# Patient Record
Sex: Male | Born: 1935
Health system: Southern US, Community
[De-identification: ages and names within clinical notes are randomized; demographics above are authoritative.]

## PROBLEM LIST (undated history)

## (undated) DIAGNOSIS — N210 Calculus in bladder: Secondary | ICD-10-CM

## (undated) DIAGNOSIS — I77810 Thoracic aortic ectasia: Secondary | ICD-10-CM

## (undated) DIAGNOSIS — Z8679 Personal history of other diseases of the circulatory system: Secondary | ICD-10-CM

## (undated) DIAGNOSIS — R7303 Prediabetes: Secondary | ICD-10-CM

## (undated) DIAGNOSIS — Z952 Presence of prosthetic heart valve: Secondary | ICD-10-CM

## (undated) DIAGNOSIS — I639 Cerebral infarction, unspecified: Secondary | ICD-10-CM

## (undated) DIAGNOSIS — T8859XA Other complications of anesthesia, initial encounter: Secondary | ICD-10-CM

## (undated) DIAGNOSIS — I2510 Atherosclerotic heart disease of native coronary artery without angina pectoris: Secondary | ICD-10-CM

## (undated) DIAGNOSIS — Z86010 Personal history of colonic polyps: Secondary | ICD-10-CM

## (undated) DIAGNOSIS — I499 Cardiac arrhythmia, unspecified: Secondary | ICD-10-CM

## (undated) DIAGNOSIS — I10 Essential (primary) hypertension: Secondary | ICD-10-CM

## (undated) DIAGNOSIS — M1990 Unspecified osteoarthritis, unspecified site: Secondary | ICD-10-CM

## (undated) DIAGNOSIS — D696 Thrombocytopenia, unspecified: Secondary | ICD-10-CM

## (undated) DIAGNOSIS — Z8774 Personal history of (corrected) congenital malformations of heart and circulatory system: Secondary | ICD-10-CM

## (undated) DIAGNOSIS — R739 Hyperglycemia, unspecified: Secondary | ICD-10-CM

## (undated) DIAGNOSIS — R011 Cardiac murmur, unspecified: Secondary | ICD-10-CM

## (undated) DIAGNOSIS — Z860101 Personal history of adenomatous and serrated colon polyps: Secondary | ICD-10-CM

## (undated) DIAGNOSIS — I071 Rheumatic tricuspid insufficiency: Secondary | ICD-10-CM

## (undated) DIAGNOSIS — Z87898 Personal history of other specified conditions: Secondary | ICD-10-CM

## (undated) DIAGNOSIS — N400 Enlarged prostate without lower urinary tract symptoms: Secondary | ICD-10-CM

## (undated) DIAGNOSIS — I77819 Aortic ectasia, unspecified site: Secondary | ICD-10-CM

## (undated) DIAGNOSIS — Z8719 Personal history of other diseases of the digestive system: Secondary | ICD-10-CM

## (undated) DIAGNOSIS — G629 Polyneuropathy, unspecified: Secondary | ICD-10-CM

## (undated) DIAGNOSIS — I4819 Other persistent atrial fibrillation: Secondary | ICD-10-CM

## (undated) DIAGNOSIS — K5730 Diverticulosis of large intestine without perforation or abscess without bleeding: Secondary | ICD-10-CM

## (undated) DIAGNOSIS — Z9889 Other specified postprocedural states: Secondary | ICD-10-CM

## (undated) DIAGNOSIS — R600 Localized edema: Secondary | ICD-10-CM

## (undated) DIAGNOSIS — D649 Anemia, unspecified: Secondary | ICD-10-CM

## (undated) HISTORY — DX: Thoracic aortic ectasia: I77.810

## (undated) HISTORY — DX: Rheumatic tricuspid insufficiency: I07.1

## (undated) HISTORY — DX: Essential (primary) hypertension: I10

## (undated) HISTORY — DX: Aortic ectasia, unspecified site: I77.819

## (undated) HISTORY — PX: OTHER SURGICAL HISTORY: SHX169

## (undated) HISTORY — DX: Cerebral infarction, unspecified: I63.9

## (undated) HISTORY — PX: TRANSTHORACIC ECHOCARDIOGRAM: SHX275

## (undated) HISTORY — PX: CARDIAC CATHETERIZATION: SHX172

## (undated) HISTORY — DX: Personal history of other diseases of the digestive system: Z87.19

---

## 2001-05-21 DIAGNOSIS — N401 Enlarged prostate with lower urinary tract symptoms: Secondary | ICD-10-CM | POA: Insufficient documentation

## 2001-05-21 DIAGNOSIS — N138 Other obstructive and reflux uropathy: Secondary | ICD-10-CM | POA: Insufficient documentation

## 2003-01-09 ENCOUNTER — Emergency Department (HOSPITAL_COMMUNITY): Admission: EM | Admit: 2003-01-09 | Discharge: 2003-01-09 | Payer: Self-pay | Admitting: Emergency Medicine

## 2004-01-09 HISTORY — PX: OTHER SURGICAL HISTORY: SHX169

## 2005-02-28 HISTORY — PX: OTHER SURGICAL HISTORY: SHX169

## 2005-03-01 ENCOUNTER — Inpatient Hospital Stay (HOSPITAL_COMMUNITY): Admission: EM | Admit: 2005-03-01 | Discharge: 2005-03-20 | Payer: Self-pay | Admitting: Emergency Medicine

## 2005-03-01 ENCOUNTER — Encounter (INDEPENDENT_AMBULATORY_CARE_PROVIDER_SITE_OTHER): Payer: Self-pay | Admitting: Specialist

## 2009-09-18 ENCOUNTER — Inpatient Hospital Stay (HOSPITAL_COMMUNITY): Admission: EM | Admit: 2009-09-18 | Discharge: 2009-09-22 | Payer: Self-pay | Admitting: Emergency Medicine

## 2010-02-08 HISTORY — PX: CATARACT EXTRACTION W/ INTRAOCULAR LENS  IMPLANT, BILATERAL: SHX1307

## 2010-02-22 ENCOUNTER — Ambulatory Visit (HOSPITAL_BASED_OUTPATIENT_CLINIC_OR_DEPARTMENT_OTHER)
Admission: RE | Admit: 2010-02-22 | Discharge: 2010-02-22 | Disposition: A | Discharge status: Home / Self Care | Payer: MEDICARE | Source: Ambulatory Visit | Attending: Specialist | Admitting: Specialist

## 2010-02-22 DIAGNOSIS — H269 Unspecified cataract: Secondary | ICD-10-CM | POA: Insufficient documentation

## 2010-02-22 DIAGNOSIS — Z01810 Encounter for preprocedural cardiovascular examination: Secondary | ICD-10-CM | POA: Insufficient documentation

## 2010-02-22 DIAGNOSIS — Z01812 Encounter for preprocedural laboratory examination: Secondary | ICD-10-CM | POA: Insufficient documentation

## 2010-02-22 LAB — POCT I-STAT 4, (NA,K, GLUC, HGB,HCT)
Glucose, Bld: 117 mg/dL — ABNORMAL HIGH (ref 70–99)
HCT: 40 % (ref 39.0–52.0)
Hemoglobin: 13.6 g/dL (ref 13.0–17.0)
Potassium: 3.9 mEq/L (ref 3.5–5.1)
Sodium: 143 mEq/L (ref 135–145)

## 2010-03-23 LAB — BASIC METABOLIC PANEL
BUN: 10 mg/dL (ref 6–23)
BUN: 14 mg/dL (ref 6–23)
BUN: 16 mg/dL (ref 6–23)
CO2: 23 mEq/L (ref 19–32)
CO2: 24 mEq/L (ref 19–32)
CO2: 26 mEq/L (ref 19–32)
Calcium: 8.2 mg/dL — ABNORMAL LOW (ref 8.4–10.5)
Calcium: 8.3 mg/dL — ABNORMAL LOW (ref 8.4–10.5)
Calcium: 8.3 mg/dL — ABNORMAL LOW (ref 8.4–10.5)
Chloride: 106 mEq/L (ref 96–112)
Chloride: 109 mEq/L (ref 96–112)
Chloride: 112 mEq/L (ref 96–112)
Creatinine, Ser: 0.77 mg/dL (ref 0.4–1.5)
Creatinine, Ser: 0.83 mg/dL (ref 0.4–1.5)
Creatinine, Ser: 0.91 mg/dL (ref 0.4–1.5)
GFR calc Af Amer: >60 mL/min (ref >60)
GFR calc Af Amer: >60 mL/min (ref >60)
GFR calc Af Amer: >60 mL/min (ref >60)
GFR calc non Af Amer: >60 mL/min (ref >60)
GFR calc non Af Amer: >60 mL/min (ref >60)
GFR calc non Af Amer: >60 mL/min (ref >60)
Glucose, Bld: 109 mg/dL — ABNORMAL HIGH (ref 70–99)
Glucose, Bld: 86 mg/dL (ref 70–99)
Glucose, Bld: 92 mg/dL (ref 70–99)
Potassium: 3.5 mEq/L (ref 3.5–5.1)
Potassium: 3.6 mEq/L (ref 3.5–5.1)
Potassium: 3.7 mEq/L (ref 3.5–5.1)
Sodium: 139 mEq/L (ref 135–145)
Sodium: 141 mEq/L (ref 135–145)
Sodium: 142 mEq/L (ref 135–145)

## 2010-03-23 LAB — COMPREHENSIVE METABOLIC PANEL
ALT: 19 U/L (ref 0–53)
AST: 22 U/L (ref 0–37)
Albumin: 3.8 g/dL (ref 3.5–5.2)
Alkaline Phosphatase: 54 U/L (ref 39–117)
BUN: 20 mg/dL (ref 6–23)
CO2: 25 mEq/L (ref 19–32)
Calcium: 9 mg/dL (ref 8.4–10.5)
Chloride: 103 mEq/L (ref 96–112)
Creatinine, Ser: 1.01 mg/dL (ref 0.4–1.5)
GFR calc Af Amer: >60 mL/min (ref >60)
GFR calc non Af Amer: >60 mL/min (ref >60)
Glucose, Bld: 156 mg/dL — ABNORMAL HIGH (ref 70–99)
Potassium: 3.4 mEq/L — ABNORMAL LOW (ref 3.5–5.1)
Sodium: 138 mEq/L (ref 135–145)
Total Bilirubin: 1 mg/dL (ref 0.3–1.2)
Total Protein: 7.2 g/dL (ref 6.0–8.3)

## 2010-03-23 LAB — CBC
HCT: 33.9 % — ABNORMAL LOW (ref 39.0–52.0)
HCT: 38.8 % — ABNORMAL LOW (ref 39.0–52.0)
Hemoglobin: 12 g/dL — ABNORMAL LOW (ref 13.0–17.0)
Hemoglobin: 13.4 g/dL (ref 13.0–17.0)
MCH: 32.7 pg (ref 26.0–34.0)
MCH: 33.4 pg (ref 26.0–34.0)
MCHC: 34.6 g/dL (ref 30.0–36.0)
MCHC: 35.4 g/dL (ref 30.0–36.0)
MCV: 94.1 fL (ref 78.0–100.0)
MCV: 94.7 fL (ref 78.0–100.0)
Platelets: 146 10*3/uL — ABNORMAL LOW (ref 150–400)
Platelets: 153 10*3/uL (ref 150–400)
RBC: 3.6 MIL/uL — ABNORMAL LOW (ref 4.22–5.81)
RBC: 4.1 MIL/uL — ABNORMAL LOW (ref 4.22–5.81)
RDW: 12.8 % (ref 11.5–15.5)
RDW: 13.1 % (ref 11.5–15.5)
WBC: 7.5 10*3/uL (ref 4.0–10.5)
WBC: 7.6 10*3/uL (ref 4.0–10.5)

## 2010-03-23 LAB — URINALYSIS, ROUTINE W REFLEX MICROSCOPIC
Glucose, UA: NEGATIVE mg/dL
Hgb urine dipstick: NEGATIVE
Ketones, ur: 15 mg/dL — AB
Leukocytes, UA: NEGATIVE
Nitrite: NEGATIVE
Protein, ur: 30 mg/dL — AB
Specific Gravity, Urine: 1.032 — ABNORMAL HIGH (ref 1.005–1.030)
Urobilinogen, UA: 0.2 mg/dL (ref 0.0–1.0)
pH: 5.5 (ref 5.0–8.0)

## 2010-03-23 LAB — URINE MICROSCOPIC-ADD ON

## 2010-03-23 LAB — DIFFERENTIAL
Basophils Absolute: 0 10*3/uL (ref 0.0–0.1)
Basophils Relative: 0 % (ref 0–1)
Eosinophils Absolute: 0 10*3/uL (ref 0.0–0.7)
Eosinophils Relative: 0 % (ref 0–5)
Lymphocytes Relative: 12 % (ref 12–46)
Lymphs Abs: 0.9 10*3/uL (ref 0.7–4.0)
Monocytes Absolute: 1.3 10*3/uL — ABNORMAL HIGH (ref 0.1–1.0)
Monocytes Relative: 17 % — ABNORMAL HIGH (ref 3–12)
Neutro Abs: 5.3 10*3/uL (ref 1.7–7.7)
Neutrophils Relative %: 71 % (ref 43–77)

## 2010-03-23 LAB — MAGNESIUM
Magnesium: 1.7 mg/dL (ref 1.5–2.5)
Magnesium: 1.9 mg/dL (ref 1.5–2.5)

## 2010-03-23 LAB — MRSA PCR SCREENING: MRSA by PCR: NEGATIVE

## 2010-03-23 LAB — LIPASE, BLOOD: Lipase: 20 U/L (ref 11–59)

## 2010-05-26 NOTE — H&P (Signed)
NAME:  [name], PRIEN NO.:  [id number]   MEDICAL RECORD NO.:  [id number]          PATIENT TYPE:  INP   LOCATION:  0098                         FACILITY:  [facility]   PHYSICIAN:  Anselm Pancoast. Weatherly, M.D.DATE OF BIRTH:  1935-10-06   DATE OF ADMISSION:  03/01/2005  DATE OF DISCHARGE:                                HISTORY & PHYSICAL   CHIEF COMPLAINT:  Nausea, vomiting, abdominal pain.   HISTORY:  [name] is a 75 year old male who was referred to [place] by  the [facility], I think out at [facility], with the following history. The  patient stated that on Monday he started having significant nausea and  vomiting, upper abdominal pain, thought he had this norovirus. He did not  actually had diarrhea; he had one loose bowel movement. And then yesterday  he was seen in the clinic, treated with intravenous fluids, and then  released, did not improve, this morning returned and an abdominal x-ray was  obtained flat and upright that showed a massively dilated small bowel with  thickened, edematous small bowel. The physician called and I suggested the  patient come immediately to the emergency room, his wife drove him in, and  in the ER initially an NG tube was placed and there was over a liter of  small-bowel contents aspirated with the NG tube initially. Intravenous  fluids were started and he was given a bolus. Next, the patient's laboratory  studies were checked. White count was only 5000 with a hematocrit of 42. BUN  showed 44 and a creatinine of 1.3, liver tests were normal, and I got a  chest x-ray which did not show any free air. I discussed with the patient  and his wife that he has got guaiac positive stools on a hemoccult in Dr.  Pablo Lawrence office and I was certainly concerned that this was a cancer causing  with this massively dilated small bowel and no history of previous abdominal  surgery, and I could not identify any hernias over the umbilicus or groin.  The  patient was given permission for surgery and I do not think any  additional x-rays are needed with the markedly dilated small bowel and just  massive NG drainage that we are having.   PAST HISTORY:  He has got a history of hypertension which he said over the  last couple of days, of course, he has been vomiting all the tablets that he  normally takes for his blood pressure, and his medications are:  1.  Atenolol 50 mg every day.  2.  Hydrochlorothiazide, I think 12.5 mg a day.  3.  Atenolol 100 mg daily.  4.  He is on medication for his prostate.   He says he has had an elevated PSA but they have never confirmed a diagnosis  of cancer of the prostate, and he has had colonoscopies and he has also had  diverticulosis, but he has not actually had any acute diverticulitis and his  pain has always been nausea and vomiting and upper abdomen and not in the  lower abdomen. He denies angina, even though he  has high blood pressure, and  he is retried. He said he used to work for [employer]. Maybe he has lost 5 or 6  pounds since Christmas, but he has been trying to lose a little weight.   PHYSICAL EXAMINATION:  GENERAL:  He is a pleasant but uncomfortable male. He  has got an NG tube in with bilious drainage and certainly appears  uncomfortable because of his abdomen. General appearance as stated above.  LUNGS:  Good breath sounds bilaterally.  CARDIAC:  He has got a mild sinus tachycardia.  ABDOMEN:  He is not acutely tender in any quadrant of the abdomen but he is  very distended. There are no umbilical or inguinal hernias noted and I  cannot appreciate any really localized tenderness of any definite quadrant  of the abdomen.  RECTAL:  I did not repeat the stool hemoccult since it was relayed to me.  EXTREMITIES:  He has got no pitting edema and his skin appears unremarkable.   The patient has received about a liter-and-a-half of IV fluids and we will  give him 3 g of Unasyn and permission  obtained to proceed with exploratory  laparotomy.           ______________________________  Anselm Pancoast. Zachery Dakins, M.D.     WJW/MEDQ  D:  03/01/2005  T:  03/02/2005  Job:  [id number]

## 2010-05-26 NOTE — Discharge Summary (Signed)
NAME:  Gerald, Ayers NO.:  [id number]   MEDICAL RECORD NO.:  [id number]          PATIENT TYPE:  INP   LOCATION:  1616                         FACILITY:  [facility]   PHYSICIAN:  Anselm Pancoast. Weatherly, M.D.DATE OF BIRTH:  01/18/1935   DATE OF ADMISSION:  03/01/2005  DATE OF DISCHARGE:  03/20/2005                                 DISCHARGE SUMMARY   DIAGNOSES:  1.  Subacute cholecystitis and mechanical small-bowel obstruction on a very      marked ileus that was prolonged.  2.  History of benign prostatic hypertrophy, on prostate medicines      chronically.  3.  Mild hypertension.   OPERATION:  1.  Exploratory laparotomy.  2.  Cholecystectomy.   HISTORY:  [name] is a 75 year old Caucasian male with history of  high blood pressure and BPH, who for approximately 5 days has had nausea,  vomiting and abdominal distension, though originally to have a virus.  He is  followed by Dr. Dewaine Oats at the [facility] out on [address] and actually had  received intravenous fluids on 1 day and then released, returned and had an  x-ray that showed definitely mechanical small-bowel obstruction with  multiple very dilated loops of small bowel and multiple air-fluid levels and  I was called, being the physician on call, on the 22nd.  The patient was  sent to the emergency room and on examination had an elevated pulse of 109.  His blood pressure was elevated 189/108, but he was in pain, and he was  afebrile.  Laboratory studies in the emergency room showed a hematocrit of  43 with white count 5000, but his BUN was 44 with glucose of 177 -- he is  not a known diabetic -- and a potassium of 3.4.   HOSPITAL COURSE:  We started him on intravenous fluids, obtained a chest x-  ray that showed no active disease and I recommended we proceed on to surgery  for a small bowel obstruction and actually he was fearing that we were going  to find a tumor, since he really he had not had any  previous abdominal  surgery.  The patient was taken to surgery later that afternoon, Dr. Jerelene Redden assisted and through a midline incision, we found a very dilated small  bowel for approximately the two-thirds or three-fourths of the small bowel,  but then from an area of the ileum that was laying up along the gallbladder  distally, there was no dilatation and there was no dilatation that we could  palpate or feel in the colon, right or left.  He did have a subacutely  inflamed gallbladder and we thought that maybe this was an ileus related to  the cholecystitis.  We removed his gallbladder, but it had no actual stones.  We did not do a cholangiogram.  We milked out the succus entericus back  through an NG tube into the patient's stomach and then postoperatively kept  an NG tube in and treated him as a mechanical bowel obstruction, even though  at the time of findings, we could not actually  find anything with the  exception there was a little indentation in the omentum that was laying  against the gallbladder where the bowel was kind of adhered and we thought  that possibly this was somehow or another related to be cholecystitis.  For  the next 2-3 days he had a moderate amount of NG drainage, but his abdomen  was becoming less distended.  Dr. Maryagnes Amos actually saw him over the weekend  and I think I had operated on Thursday and over the next few days, we did  not remove his NG tube, but he started having bowel function and I removed  the NG tube on the 26th.  Actually, the tube had slipped out and he had had  a bowel movement and we kept him n.p.o. for about 24 hours.  The following  day we started liquids, but he started bloating again and I was not sure,  since he was having bowel functions.  He became repeat distended and the NG  tube was reinserted on the 28th, which was about 3 days after the NG tube  was originally removed, and he had a moderate amount of NG drainage.  Since  by  this time he had now been about a week without actually eating and had of  course been sick 4 days prior to coming to the hospital, I started  hyperalimentation and scheduled a Gastrografin enema.  The Gastrografin  enema shows significant diverticulosis, but no evidence of obstruction or  diverticulitis, and then the distal small bowel reflux where the  Gastrografin went way on up into the dilated jejunum with no focal point of  obstruction.  The patient was having just a low-grade temperature, did not  like to cough with the NG tube.  We got a  PICC line and got him started on  TPN.  We were kind of in a dilemma now on what was going on and asked Dr.  Nadine Counts Buccini to see him from a GI standpoint.  I think actually Dr. Dorena Cookey originally saw him and then Dr. Matthias Hughs and they thought that this was  even an obstruction or pseudo-obstruction and agreed with the NG, TPN and as  we were already doing.  I also checked thyroid functions and cortisol  levels; these were all normal.  The patient had a moderate amount of NG  drainage.  We had him on Reglan and Protonix and the abdomen was becoming  less distended, but it was probably more related to the NG drainage, but he  was also having some bowel function.  Approximately 4 or 5 days later, at  which time he was clinically improving, but not well, we obtained a CT and  this showed just like what we had found at surgery, no evidence of any areas  of infection or abscess or lesions that had been missed, but a dilated small  bowel at the proximal two-thirds of the small bowel and then kind of  decompressed distal small bowel with the hyperalimentation and ambulation  and activity, and of course we had taken his Foley catheter out, but he was  having difficulty voiding and the patient actually does some self-  catheterization and I had put the Foley catheter back in to straight  drainage instead of trying to do in-and-out catheterizations.  He  then started having bowel function and we slowly removed the NG tube after  clamping it for 24 hours and then kind of advanced his diet slowly.  He had  an episode of pain in the left ankle that appeared to be gout, which he has  had before and we switched his Reglan to p.o. and had placed him back on  some gout medications.  He is slowly improving.  White count was never  elevated, had just a little low-grade fever about the time we reput the NG  tube in and was ready to be discharge, where the patient wanted to  discharge, even though it was kind of plus/minus whether he was definitely  ready to be discharge.  His electrolytes were normal and he was discharged  on the 13th with instructions that if he is having any vomiting or fever, to  let me know.  He is taking Ensure in addition to kind of a full liquid diet  and I will see him in the office in approximately 3-4 days.  He is anemic,  but I think it is probably more related to his kind of chronic illnesses and  much blood drawing as we have never noted any thing on the colon  examinations by x-ray of any lesions and he has not had an upper endoscopy  or colonoscopy.  He will be followed in the office and if any positive  stools are determined, may need a colon or upper endoscopy.  He is  discharged on Reglan and Protonix and ferrous sulfate and has resumed his  prostate medications.  This is a little prolonged ileus and whether this was  a primary problem secondary to the gallstones or whether the cholecystitis  occurred because of his inability to eat for 3 or 4 days, I am not sure,  since there were no stones.  Further testing may be needed and he will be  followed by [place] closely over the next few weeks.           ______________________________  Anselm Pancoast. Zachery Dakins, M.D.     WJW/MEDQ  D:  04/29/2005  T:  05/01/2005  Job:  [id number]   cc:   Molly Maduro L. Foy [place], M.D.  Fax: [phone]   Bernette Redbird, M.D.  Fax: [phone]

## 2010-05-26 NOTE — Op Note (Signed)
NAME:  [name], [name] NO.:  [id number]   MEDICAL RECORD NO.:  [id number]          PATIENT TYPE:  EMS   LOCATION:  ED                           FACILITY:  [facility]   PHYSICIAN:  Anselm Pancoast. Weatherly, M.D.DATE OF BIRTH:  November 25, 1935   DATE OF PROCEDURE:  02/28/2005  DATE OF DISCHARGE:                                 OPERATIVE REPORT   PREOPERATIVE DIAGNOSIS:  Small bowel obstruction.   POSTOPERATIVE DIAGNOSIS:  Massively dilated small bowel and acute  cholecystitis.   OPERATIONS:  Exploratory laparotomy and a cholecystectomy. Also has a  history of diverticulosis sigmoid colon.   SURGEON:  Dr. Anselm Pancoast. Weatherly.   ASSISTANT:  Dr. Jerelene Redden.   ANESTHESIA:  General anesthesia.   HISTORY:  [name] is a 75 year old male who presented to the ER  today after being seen by Dr. Pablo Lawrence office with about a 3-4 day history of  nausea and vomiting and a large quantity of bilious vomitus. He had been  seen in the clinic yesterday and was thought to have this Norovirus, treated  with IV fluids and released. He did not feel better last evening and  returned today and an abdominal x-ray flat and upright was obtained which  showed a massively dilated small bowel with multiple air-fluid levels. He  was referred here and on examination he is very distended with a large  amount of NG drainage. We placed the NG and got over a liter initially and  gives no history of previous chronic cramping, bloating, etc. He has had a  history of diverticulosis in the past and had some difficulty voiding but  has not got a history of prostate cancer or any definite known malignancy. I  cannot appreciate any hernias and I recommended we proceed on with  exploratory laparotomy because he is so distended and uncomfortable.  Preoperatively, he was given 3 grams of  Unasyn and he has got PAS  stockings. Induction of general anesthesia,  endotracheal tube was  positioned. He has already  got the NG tube in and then the abdomen was  shaved in the midline. A Foley catheter was inserted sterilely. A small  incision was made up above the umbilicus and very carefully entered into the  peritoneal cavity about a 3 inch incision above the umbilicus and the  underlying small bowel is very hyperemic and very dilated. I extended the  incision a little bit upwards so I could get my hand in and then on  palpation could feel the diverticulosis in the lower abdomen but could not  feel an actual tumor in the cecal area. I extended the incision down below  the umbilicus so that we could then start kind of milking out or getting the  very dilated small bowel out. In removing the very dilated small bowel, we  could see that probably about a distal foot of terminal ileum does not  appear to be dilated. We extended the incision upward because he is so  distended that really you could not get this dilated small bowel out through  this small incision and then after  we kind of milked the small bowel out and  actually the NG tube is in the stomach but there is still a large amount of  drainage in the stomach and we got the anesthetist to kind of play with the  NG tube so that we could get it decompressed with irrigating, aspirating,  etc. Then we could milk the proximal small bowel contents back through the  duodenum into the stomach so we could get more room and then after this was  done and the small bowel was definitely becoming less angry looking because  it was being decompressed, we were kind of in a dilemma on why did he have  such a small bowel dilatation if we could not find an actual point of  obstruction. We then on looking around in the upper abdomen noted that he  has got a very distended tight gallbladder and it looks like it is an  acutely inflamed gallbladder. There was also an area of the omentum that was  up close to the gallbladder that has a little recess in it and I think that   really there was a loop of small bowel that was kind of caught up in this  and as we were kind of eviscerating the small bowel we kind of  separated  that and we think that was the actual point of obstruction. We next ran the  small bowel again, could not feel any type of food or anything to cause a  blockage within it. The small bowel gas distally now was going on into the  colon and in palpation of the cecum the appendix is normal. We could not  feel any mass in the ascending colon or hepatic flexure. The sigmoid colon  has extensive diverticulosis and adhesions around it but this does not  appear to the point of obstruction down in the pelvis and we elected to go  ahead and do a cholecystectomy. This was done open identifying the cystic  artery and the cystic duct. Tying the cystic duct doubly with two sutures of  2-0 silk having a Kelly on its junction with the gallbladder and then  dividing at the cystic artery it was doubly clipped proximally, singly  distally and divided. Then I switched to the upper gallbladder and actually  decompressed it with a trocar so we could grasp it with a Tresa Endo and then  very carefully dissected this markedly inflamed gallbladder from the  gallbladder fossa. Hemostasis was obtained predominantly with cautery and  the proximal area was carefully dissected making sure that we did not injure  the common biliary system. A pack was placed in the area and then later we  removed this and lightly cauterized a few little areas and then basically  reinspected the small bowel again and could not find any other problems. The  sponge count was correct. We placed the small bowel kind of in anatomic  position, made sure the NG tube was in good position, irrigated and  aspirated the remaining little fluid out of the stomach and then brought the  omentum over the small bowel. I then closed this with a running #1 Prolene. Two sutures were placed and then the skin was closed  with staples. I am  going to keep him on intravenous fluids and antibiotics, keep his Foley and  hopefully he will be decompressed in a day or two and start having bowel  function. I think we are going to need to get an  x-ray of his colon after he  has kind of recovered but whether it was extensive ileus or definite  obstruction secondary to adhesions from the gallbladder I cannot be sure. We  certainly could not identify anything else that would cause the obstruction.  Sponge and needle counts were correct and estimated blood loss is minimal.           ______________________________  Anselm Pancoast. Zachery Dakins, M.D.     WJW/MEDQ  D:  03/01/2005  T:  03/02/2005  Job:  [id number]   cc:   Molly Maduro L. Foy [place], M.D.  Fax: [phone]

## 2010-05-26 NOTE — Consult Note (Signed)
NAME:  [name], [name] NO.:  [id number]   MEDICAL RECORD NO.:  [id number]          PATIENT TYPE:  INP   LOCATION:  1616                         [facility]ACILITY:  [facility]   PHYSICIAN:  John C. [name], M.D.    DATE OF BIRTH:  1935/02/02   DATE OF CONSULTATION:  03/10/2005  DATE OF DISCHARGE:                                   CONSULTATION   REASON FOR CONSULTATION:  Persistent small bowel dilatation unclear whether  obstructive or from a pseudoobstruction.   HISTORY OF PRESENT ILLNESS:  The patient is a 75 year old white male who  presented on the night of November 19 with onset of nausea and vomiting.  After eating supper, he had felt fine that day and previous days. The next  day he continued to have vomiting and abdominal bloating and distention  despite a clear liquid diet. He did not have any diarrhea. He presented to  his primary care physician who was given IV fluids on November 22 and  obtained a KUB which showed massive distention of the majority of his small  bowel and he was sent to [facility] emergency room and evaluated by Dr.  Zachery Dakins. A diagnosis of high grade small-bowel obstruction was made and he  went to surgery. At the time of surgery, he did have extreme dilatation of  the majority of his small bowel but the distal 1-2 feet was of relatively  normal caliber and no definite point of obstruction was seen. The  gallbladder did appear inflamed and there appeared to be some adhesions to  the small bowel and this was postulated to be a possible point of  obstruction. By palpation, there was no significant diverticular  inflammatory process or any colonic or cecal mass appreciated. The patient  did well initially and his NG tube fell out on the second hospital day.  However, shortly after that he had progressed with abdominal distention and  nausea. An NG tube was placed with greater than a liter of brownish fluid  and he has continued to have high volume NG  output since. KUB on November 26  and subsequent to that have had gradually increasing dilatation of the  proximal to mid small bowel as on his preoperative film. His gallbladder did  not have any stones on pathology. The patient has been started on IV Reglan  but has continued to have high volume NG output. A barium enema was done  yesterday which showed diverticulosis of incompetent ileocecal valve and  free reflux into the terminal ileum and barium and Gastrografin was able to  be passed well into the proximal small bowel with a gradually increasing  distention to over 5.3 cm in the proximal small bowel and no definite point  of obstruction. The patient does not have any severe abdominal pain and is  currently not nauseated with the NG tube in. He has had an average of about  2 liters a day NG output.   PAST MEDICAL HISTORY:  Hypertension, diverticulosis.   PAST SURGICAL HISTORY:  None.   ALLERGIES:  SULFA.   SOCIAL HISTORY:  The patient  is married. He has a son. He drinks alcohol  occasionally, denies cigarette smoking.   PHYSICAL EXAMINATION:  GENERAL:  Well-developed, well-nourished, white male  in no acute distress. NG tube in place with fairly dark brown fluid in the  canister.  VITAL SIGNS:  Temperature 97.8, blood pressure 170/89, pulse 81.  ABDOMEN:  Soft, slightly distended with normoactive bowel sounds. No  hepatosplenomegaly, mass or guarding. There is a well healed surgical scar  over the abdomen.   LABORATORY DATA:  WBC 18,500, hemoglobin 9.8, platelets 301. Potassium 3.7,  ALT minimally elevated at 67, bilirubin 1.1.   IMPRESSION:  Picture of recurrent high grade bowel obstruction or  pseudoobstruction with the latter certainly seeming to be more than likely.  He does not have any predisposing factors that I can identify such as  __________ existing neuropathy or myopathy and at this point would best  characterize this as an acute small intestinal  pseudoobstruction.   PLAN:  Will confer with Dr. Zachery Dakins about the next study but I feel he  would probably be best served by an antegrade small bowel study through his  NG tube to more conclusively rule out any sort of intussusception point or  any other missed point of obstruction. He is already on IV Reglan and we may  consider adding IV erythromycin. At this point, I do not see any __________  for upper endoscopy with small bowel biopsy but will possibly consider this  if necessary. I agreed with TPN which has already been started on. Will  follow with you.           ______________________________  [name]. [name], M.D.     JCH/MEDQ  D:  03/10/2005  T:  03/12/2005  Job:  [id number]   cc:   Molly Maduro L. Foy [place], M.D.  Fax: [phone]   Anselm Pancoast. Zachery Dakins, M.D.  [address], [address]  [place]  [place] [address]

## 2010-06-02 ENCOUNTER — Inpatient Hospital Stay (HOSPITAL_BASED_OUTPATIENT_CLINIC_OR_DEPARTMENT_OTHER)
Admission: RE | Admit: 2010-06-02 | Discharge: 2010-06-02 | Disposition: A | Discharge status: Home / Self Care | Payer: Medicare Other | Source: Ambulatory Visit | Attending: Cardiology | Admitting: Cardiology

## 2010-06-02 DIAGNOSIS — R0609 Other forms of dyspnea: Secondary | ICD-10-CM | POA: Insufficient documentation

## 2010-06-02 DIAGNOSIS — I2510 Atherosclerotic heart disease of native coronary artery without angina pectoris: Secondary | ICD-10-CM | POA: Insufficient documentation

## 2010-06-02 DIAGNOSIS — R0989 Other specified symptoms and signs involving the circulatory and respiratory systems: Secondary | ICD-10-CM | POA: Insufficient documentation

## 2010-06-02 DIAGNOSIS — I700 Atherosclerosis of aorta: Secondary | ICD-10-CM | POA: Insufficient documentation

## 2010-06-09 NOTE — Cardiovascular Report (Signed)
NAME:  [name], SCHUBACH NO.:  [id number]  MEDICAL RECORD NO.:  [id number]           PATIENT TYPE:  LOCATION:                                 FACILITY:  PHYSICIAN:  Jake Bathe, MD      DATE OF BIRTH:  April 04, 1935  DATE OF PROCEDURE:  06/02/2010 DATE OF DISCHARGE:                           CARDIAC CATHETERIZATION   PROCEDURE:  Cardiac catheterization via right femoral artery approach with selective coronary angiography, left ventriculogram, aortic root angiogram, and distal aortic angiogram.  INDICATIONS:  A 75 year old male with progressive dyspnea on exertion with no significant chest pain, decreased exercise tolerance who underwent nuclear stress test which showed no specific perfusion defects, however, he did have ST-segment depression both at baseline and at stress, which was accentuated.  PROCEDURE DETAILS:  Informed consent was obtained.  Risk of stroke, heart attack, death, renal impairment, arterial damage, bleeding were explained to the patient at length.  Ample time for questioning was observed.  Alternatives to treatment were explained.  He was prepped and draped in a sterile fashion, placed on a catheterization table. Visualization of the femoral head was obtained via fluoroscopy.  His femoral pulse was considerably bounding without a significantly elevated blood pressure.  A 4-French sheath was inserted in the right femoral artery without difficulty after 1% lidocaine was used for local anesthesia.  A Judkins left #4 catheter and a no-torque Williams right catheter were used to selectively engage the coronary arteries. Multiple views with hand injection of Omnipaque were obtained.  An angled pigtail was then used across the left ventricle.  Hemodynamics were obtained.  RAO position, left ventriculogram with 25 mL contrast was performed.  An aortic root shot was then performed utilizing 25 mL of contrast with power injection and then a distal  aortogram was performed utilizing 25 mL of contrast secondary to the bounding femoral artery to ensure that there was no evidence of any aneurysmal dilatation.  FINDINGS: 1. Right coronary artery.  Dominant vessel giving rise to posterior     descending artery.  A 90-degree bend in the proximal section.     There is 30% stenosis at this bend.  There is also a 30% mid     stenosis at a secondary bend in the mid segment.  No flow-limiting     CAD present. 2. Left main artery - widely patent giving rise to both the LAD, ramus     branch and circumflex branch.  No angiographically significant     disease. 3. Left anterior descending artery - there is one diagonal branch     present.  The proximal LAD has calcification present and stenosis     of 30-40%.  This does not appear to be flow limiting.  In the mid     LAD segment, there is minor stenosis of approximately 30% as well.     LAD then continues to wrap around the apex.  It is slightly     tortuous in its distal portion.  Overall, no flow-limiting disease. 4. Ramus branch.  This vessel is medium size in caliber, branch     bifurcates distally  and does not demonstrate any flow-limiting     disease. 5. Circumflex branch.  Large caliber vessel giving rise to 2 obtuse     marginal branches.  No angiographically significant disease     present. 6. Left ventriculogram.  Left ventricular ejection fraction appears     normal at 55-60%.  There is no significant mitral regurgitation     present.  Left ventricular hemodynamics demonstrates a systolic     pressure 122, end-diastolic pressure 7 mmHg, aortic pressure is     122/55 (widened pulse pressure with a mean of 82 mmHg).  There is     no gradient across the aortic valve. 7. Aortic root angiogram.  This demonstrates aortic regurgitation     which fills the left ventricle, but does not opacify to the same     degree as the aortic root.  On a grading scale, appears more     moderate than  severe on echocardiogram.  Aortic regurgitation     appeared to be moderate to severe.  Aortic root is mildly dilated     as was seen on echocardiogram at 4.1 cm. 8. Abdominal aortogram.  Renal arteries are both widely patent.  The     abdominal aorta is tortuous, however, there was no evidence of any     aneurysm present and femoral artery appears normal with no     aneurysmal segmentation.  IMPRESSION: 1. Minor coronary artery disease that is non-flow limiting with     calcified proximal left anterior descending, lesion of 30-40% most     notable. 2. Normal left ventricular ejection fraction of 55-60%. 3. Moderate-appearing aortic valve regurgitation from aortic root     angiogram.  A widened pulse pressure noted on aortic pressures. 4. Abdominal aortic is tortuous but normal without any aneurysmal     segments.  PLAN:  Findings have been discussed with the patient.  Reassuring coronary artery anatomy.  My main concern is the possibility that his aortic regurgitation is possibly contributing to his symptoms of dyspnea on exertion, then the severity of the aortic regurgitation is more severe than moderate.  Given this, I will likely perform a transesophageal echocardiogram to further delineate and quantify aortic regurgitation.  I have discussed with him the possibility of aortic valve replacement if necessary.     Jake Bathe, MD     MCS/MEDQ  D:  06/02/2010  T:  06/03/2010  Job:  [id number]  Electronically Signed by Donato Schultz MD on 06/09/2010 06:43:40 AM

## 2010-06-21 ENCOUNTER — Ambulatory Visit (HOSPITAL_COMMUNITY)
Admission: RE | Admit: 2010-06-21 | Discharge: 2010-06-21 | Disposition: A | Discharge status: Home / Self Care | Payer: Medicare Other | Source: Ambulatory Visit | Attending: Cardiology | Admitting: Cardiology

## 2010-06-21 DIAGNOSIS — I059 Rheumatic mitral valve disease, unspecified: Secondary | ICD-10-CM | POA: Insufficient documentation

## 2010-06-21 DIAGNOSIS — I359 Nonrheumatic aortic valve disorder, unspecified: Secondary | ICD-10-CM | POA: Insufficient documentation

## 2010-06-21 DIAGNOSIS — Q211 Atrial septal defect: Secondary | ICD-10-CM | POA: Insufficient documentation

## 2010-06-21 DIAGNOSIS — Q2111 Secundum atrial septal defect: Secondary | ICD-10-CM | POA: Insufficient documentation

## 2010-06-21 DIAGNOSIS — I079 Rheumatic tricuspid valve disease, unspecified: Secondary | ICD-10-CM | POA: Insufficient documentation

## 2010-06-26 ENCOUNTER — Encounter (INDEPENDENT_AMBULATORY_CARE_PROVIDER_SITE_OTHER): Payer: Medicare Other | Admitting: Thoracic Surgery (Cardiothoracic Vascular Surgery)

## 2010-06-26 DIAGNOSIS — I359 Nonrheumatic aortic valve disorder, unspecified: Secondary | ICD-10-CM

## 2010-06-27 ENCOUNTER — Other Ambulatory Visit: Payer: Self-pay | Admitting: Thoracic Surgery (Cardiothoracic Vascular Surgery)

## 2010-06-27 DIAGNOSIS — I77810 Thoracic aortic ectasia: Secondary | ICD-10-CM

## 2010-06-27 NOTE — H&P (Signed)
HISTORY AND PHYSICAL EXAMINATION  June 26, 2010  Re:  [name], [name]       DOB:  1935-10-03  PRIMARY CARE PHYSICIAN:  Molly Maduro L. Foy Guadalajara, MD  REASON FOR CONSULTATION:  Severe aortic insufficiency.  HISTORY OF PRESENT ILLNESS:  The patient is a 75 year old gentleman from [place] with recently discovered severe aortic regurgitation.  The patient has no previous history of rheumatic fever nor bacterial endocarditis, and he was only first noted to have a heart murmur on physical exam recently.  The patient describes a several year history of exertional shortness of breath that has progressed somewhat over the past year or so.  The patient underwent a nuclear stress treadmill test that was abnormal with decreased blood pressure during stress and diffuse ST-segment changes on electrocardiogram despite normal perfusion images.  He underwent a 2-D echocardiogram at [facility] on May 31, 2010, demonstrating moderate-to-severe aortic regurgitation.  There was moderate concentric left ventricular hypertrophy with severe asymmetric septal hypertrophy, but normal left ventricular systolic function with ejection fraction 60-65%.  Left ventricular chamber size was not commented upon.  There was mild aortic root dilatation.  No other significant abnormalities were noted.  The patient then underwent diagnostic cardiac catheterization by Dr. Anne Fu on Jun 02, 2010.  This revealed luminal irregularities and coronary arteries with perhaps 30- 40% stenosis of the left anterior descending coronary artery, but otherwise no significant coronary artery disease.  There was at least moderate aortic insufficiency.  There was mild aortic root dilatation. There was no significance of aneurysm of the descending abdominal aorta or iliacs and there was no significant aortoiliac occlusive disease.  To further characterize the patient's aortic insufficiency, the patient underwent  transesophageal echocardiogram by Dr. Anne Fu on June 21, 2010. This demonstrated the presence of severe aortic insufficiency with an eccentric jet of regurgitation.  The aortic valve was tricuspid.  No other significant abnormalities were noted.  Left ventricular ejection fraction was estimated 60-65%.  The patient has now been referred to consider elective aortic valve replacement.  REVIEW OF SYSTEMS:  GENERAL:  The patient reports normal appetite.  He has not been gaining nor losing weight recently.  He is 6 feet tall and weight is 185 pounds. CARDIAC:  The patient describes stable symptoms of exertional shortness of breath.  The patient states that he only gets short of breath if he walks up a hill work of if he pushes himself physically.  He denies shortness of breath with walking at a slow pace on flat ground.  He denies resting shortness of breath, PND, orthopnea, or lower extremity edema.  He has had occasional mild dizzy spells, particularly if he stands up suddenly.  He has not had syncope.  He has never had any sort of chest pain or chest tightness.  RESPIRATORY:  Negative.  The patient denies productive cough, hemoptysis, wheezing. GASTROINTESTINAL:  Negative.  The patient has no difficulty swallowing. He denies hematochezia, hematemesis, melena.  GENITOURINARY:  Notable for some mild symptoms of difficulty starting his stream that are stable and related to known benign prostatic hypertrophy.  These were not problematic. PERIPHERAL VASCULAR:  Negative.  The patient denies symptoms suggestive of claudication. NEUROLOGIC:  Notable and that the patient does have some numbness in the toes of his right foot and the lateral aspect of the toes on the left foot.  This is chronic and stable and has not changed.  It is not associated with pain.  The patient denies transient monocular blindness or  transient numbness or weakness. PSYCHIATRIC:  Negative. HEENT:  Negative.  The patient  sees a dentist regularly by annual basis and has no ongoing dental issues.  PAST MEDICAL HISTORY: 1. Aortic insufficiency. 2. Hypertension. 3. Gout. 4. Partial small bowel obstruction treated conservatively.  PAST SURGICAL HISTORY:  Exploratory laparotomy and cholecystectomy.  FAMILY HISTORY:  Noncontributory.  SOCIAL HISTORY:  The patient is married and lives with his wife here in [place].  He is retired having previously worked for [employer]. He remains reasonably active physically for a gentleman of his age.  He is a nonsmoker.  He does not use alcohol.  CURRENT MEDICATIONS: 1. Aspirin 81 mg daily. 2. Finasteride 5 mg daily. 3. Allopurinol 300 mg daily 4. Hydrochlorothiazide 12.5 mg daily. 5. Terazosin 5 mg daily. 6. Trazodone 50 mg daily. 7. Atenolol 50 mg daily. 8. Multivitamin. 9. Vitamin B6 daily. 10.Calcium plus vitamin D.  DRUG ALLERGIES:  SULFA.  PHYSICAL EXAMINATION:  General:  The patient is a well-appearing male who appears his stated age, in no acute distress.  Vital Signs:  Blood pressure 141/68, pulse 54, oxygen saturation 95% on room air.  HEENT: Unrevealing.  Neck:  Supple.  There is no cervical nor supraclavicular lymphadenopathy.  Chest:  Auscultation of the chest reveals clear breath sounds that are symmetrical bilaterally.  Cardiovascular:  Regular rate and rhythm.  There is a soft grade 2/6 diastolic murmur heard along the sternal border.  Abdomen:  Soft and nontender.  Bowel sounds are present.  There is well-healed midline laparotomy scar.  There are no palpable masses.  Extremities: Warm and well-perfused.  Femoral pulses are palpable.  Distal pulses are palpable in the posterior tibial and dorsalis pedis position.  There is no sign of venous insufficiency. Rectal and GU:  Both deferred.  DIAGNOSTIC TESTS:  Cardiac catheterization performed by Dr. Anne Fu is reviewed.  This reveals no significant coronary artery disease.  There is some  luminal irregularities with perhaps 30% stenosis in the left anterior descending coronary artery, but there are clearly no flow- limiting lesions.  There is at least moderate aortic insufficiency.  The aortic root is mildly dilated.  No other significant abnormalities are noted and left ventricular function appears normal.  Imaging of the abdominal aorta and iliac vessels is notable for the absence of any significant aortoiliac occlusive disease.  Transesophageal echocardiogram performed by Dr. Anne Fu is reviewed.  The aortic valve is tricuspid.  There is severe aortic insufficiency with an eccentric jet.  The aortic annulus is normal size.  There is mild dilatation of the aortic root.  Left ventricular function appears normal.  The mitral valve appears normal.  No other significant abnormalities are noted.  IMPRESSION:  Severe aortic insufficiency with stable symptoms of exertional shortness of breath.  Left ventricular function appears normal and there does not appear to be any significant left ventricular chamber enlargement.  I agree that the patient would benefit from elective aortic valve replacement.  He maybe good candidate for use of minimally invasive approach.  PLAN:  I have discussed options at length with the patient and his wife here in the office today.  The rationale for surgical intervention has been discussed and compared and contrasted with continued medical therapy and close followup.  Surgical alternatives have been discussed in detail and in particular we have discussed whether or not to replace his valve using a bioprosthetic tissue valve or a mechanical prosthesis. The relative risks and benefits of each of these approaches have  been reviewed.  At this point, the patient seems inclined to go with a bioprosthetic tissue valve.  He understands that this would come with a small risk for late structural valve deterioration and failure depending upon his longevity.   We also discussed whether or not to perform surgery through a conventional sternotomy or via mini thoracotomy approach.  The relative risks and benefits of each of these approaches have been discussed.  He seems very interested in the minithoracotomy approach. All of his questions have been addressed.  He desires to wait until the middle of July before scheduling surgery because of a trip that he and his wife have currently planned.  We will plan to see him back on July 9 and make definitive plans for surgery at that time.  We will obtain CT angiogram of the chest to rule-out the possibility of significant aneurysmal enlargement of the ascending thoracic aorta.  Salvatore Decent. [name], M.D. Electronically Signed  CHO/MEDQ  D:  06/26/2010  T:  06/27/2010  Job:  [id number]  cc:   Jake Bathe, MD Doris Cheadle. Foy Guadalajara, M.D.

## 2010-06-29 ENCOUNTER — Ambulatory Visit
Admission: RE | Admit: 2010-06-29 | Discharge: 2010-06-29 | Disposition: A | Discharge status: Home / Self Care | Payer: Medicare Other | Source: Ambulatory Visit | Attending: Thoracic Surgery (Cardiothoracic Vascular Surgery) | Admitting: Thoracic Surgery (Cardiothoracic Vascular Surgery)

## 2010-06-29 DIAGNOSIS — I77810 Thoracic aortic ectasia: Secondary | ICD-10-CM

## 2010-06-29 MED ORDER — IOHEXOL 300 MG/ML  SOLN: 100.0000 mL | Freq: Once | INTRAMUSCULAR | Status: AC | PRN

## 2010-07-17 ENCOUNTER — Ambulatory Visit (INDEPENDENT_AMBULATORY_CARE_PROVIDER_SITE_OTHER): Payer: Medicare Other | Admitting: Thoracic Surgery (Cardiothoracic Vascular Surgery)

## 2010-07-17 ENCOUNTER — Encounter: Payer: Medicare Other | Admitting: Thoracic Surgery (Cardiothoracic Vascular Surgery)

## 2010-07-17 DIAGNOSIS — I359 Nonrheumatic aortic valve disorder, unspecified: Secondary | ICD-10-CM

## 2010-07-17 NOTE — Assessment & Plan Note (Addendum)
OFFICE VISIT  KARI, KERTH DOB:  Mar 15, 1935                                        July 17, 2010 CHART #:  [id number]  The patient returns to the office for further followup of aortic insufficiency.  He was originally seen in consultation on June 18 and a full history and physical exam were dictated at that time.  Since then the patient remains clinically stable.  He now desires to schedule elective surgery sometime later this month.  We spent in excess of 30 minutes reviewing the indications, risks, and potential benefits of elective aortic valve replacement.  We compared and contrasted this to continue to careful followup with serial echocardiograms.  The patient would rather proceed with surgery at this time.  He continues to maintain that he would prefer to have his valve replaced using a bioprosthetic tissue valve.  He understands that this will come with some risk for late structural valve deterioration and failure depending upon his longevity.  We also discussed the relative risks and benefits of the minimally invasive approach and compared and contrasted to a conventional sternotomy.  He underwent CT angiogram of the chest last month demonstrating essentially normal-appearing thoracic aorta with mild fusiform dilatation of the aortic root and proximal aorta. Overall, anatomical considerations appear favorable.  All of this questions have been addressed.  We tentatively plan to proceed with surgery on Thursday, July 26.  On the morning of surgery, the patient will take his beta-blocker but otherwise not take any other medications.  Salvatore Decent. [name], M.D. Electronically Signed  CHO/MEDQ  D:  07/17/2010  T:  07/17/2010  Job:  [id number]  cc:   Jake Bathe, MD Doris Cheadle. Foy Guadalajara, M.D.

## 2010-08-01 ENCOUNTER — Other Ambulatory Visit: Payer: Self-pay | Admitting: Thoracic Surgery (Cardiothoracic Vascular Surgery)

## 2010-08-01 ENCOUNTER — Ambulatory Visit (HOSPITAL_COMMUNITY)
Admission: RE | Admit: 2010-08-01 | Discharge: 2010-08-01 | Disposition: A | Discharge status: Home / Self Care | Payer: Medicare Other | Source: Ambulatory Visit | Attending: Thoracic Surgery (Cardiothoracic Vascular Surgery) | Admitting: Thoracic Surgery (Cardiothoracic Vascular Surgery)

## 2010-08-01 ENCOUNTER — Encounter (HOSPITAL_COMMUNITY)
Admission: RE | Admit: 2010-08-01 | Discharge: 2010-08-01 | Disposition: A | Discharge status: Home / Self Care | Payer: Medicare Other | Source: Ambulatory Visit | Attending: Thoracic Surgery (Cardiothoracic Vascular Surgery) | Admitting: Thoracic Surgery (Cardiothoracic Vascular Surgery)

## 2010-08-01 DIAGNOSIS — I359 Nonrheumatic aortic valve disorder, unspecified: Secondary | ICD-10-CM

## 2010-08-01 DIAGNOSIS — Z01810 Encounter for preprocedural cardiovascular examination: Secondary | ICD-10-CM

## 2010-08-01 DIAGNOSIS — R0602 Shortness of breath: Secondary | ICD-10-CM | POA: Insufficient documentation

## 2010-08-01 DIAGNOSIS — I351 Nonrheumatic aortic (valve) insufficiency: Secondary | ICD-10-CM

## 2010-08-01 DIAGNOSIS — I10 Essential (primary) hypertension: Secondary | ICD-10-CM | POA: Insufficient documentation

## 2010-08-01 DIAGNOSIS — I2510 Atherosclerotic heart disease of native coronary artery without angina pectoris: Secondary | ICD-10-CM | POA: Insufficient documentation

## 2010-08-01 LAB — CBC
HCT: 37.7 % — ABNORMAL LOW (ref 39.0–52.0)
Hemoglobin: 13.4 g/dL (ref 13.0–17.0)
MCH: 32.8 pg (ref 26.0–34.0)
MCHC: 35.5 g/dL (ref 30.0–36.0)
MCV: 92.2 fL (ref 78.0–100.0)
Platelets: 162 10*3/uL (ref 150–400)
RBC: 4.09 MIL/uL — ABNORMAL LOW (ref 4.22–5.81)
RDW: 13.2 % (ref 11.5–15.5)
WBC: 6.6 10*3/uL (ref 4.0–10.5)

## 2010-08-01 LAB — COMPREHENSIVE METABOLIC PANEL
ALT: 19 U/L (ref 0–53)
AST: 21 U/L (ref 0–37)
Albumin: 3.9 g/dL (ref 3.5–5.2)
Alkaline Phosphatase: 74 U/L (ref 39–117)
BUN: 14 mg/dL (ref 6–23)
CO2: 28 mEq/L (ref 19–32)
Calcium: 9.9 mg/dL (ref 8.4–10.5)
Chloride: 105 mEq/L (ref 96–112)
Creatinine, Ser: 0.88 mg/dL (ref 0.50–1.35)
GFR calc Af Amer: >60 mL/min (ref >60)
GFR calc non Af Amer: >60 mL/min (ref >60)
Glucose, Bld: 120 mg/dL — ABNORMAL HIGH (ref 70–99)
Potassium: 4.3 mEq/L (ref 3.5–5.1)
Sodium: 142 mEq/L (ref 135–145)
Total Bilirubin: 0.3 mg/dL (ref 0.3–1.2)
Total Protein: 6.8 g/dL (ref 6.0–8.3)

## 2010-08-01 LAB — URINALYSIS, ROUTINE W REFLEX MICROSCOPIC
Bilirubin Urine: NEGATIVE
Glucose, UA: NEGATIVE mg/dL
Hgb urine dipstick: NEGATIVE
Ketones, ur: NEGATIVE mg/dL
Leukocytes, UA: NEGATIVE
Nitrite: NEGATIVE
Protein, ur: NEGATIVE mg/dL
Specific Gravity, Urine: 1.025 (ref 1.005–1.030)
Urobilinogen, UA: 0.2 mg/dL (ref 0.0–1.0)
pH: 5 (ref 5.0–8.0)

## 2010-08-01 LAB — BLOOD GAS, ARTERIAL
Acid-Base Excess: 1.2 mmol/L (ref 0.0–2.0)
Bicarbonate: 24.2 mEq/L — ABNORMAL HIGH (ref 20.0–24.0)
Drawn by: 344381
FIO2: 0.21 %
O2 Saturation: 98.4 %
Patient temperature: 98.6
TCO2: 25.2 mmol/L (ref 0–100)
pCO2 arterial: 31.5 mmHg — ABNORMAL LOW (ref 35.0–45.0)
pH, Arterial: 7.498 — ABNORMAL HIGH (ref 7.350–7.450)
pO2, Arterial: 97.9 mmHg (ref 80.0–100.0)

## 2010-08-01 LAB — TYPE AND SCREEN
ABO/RH(D): O POS
Antibody Screen: NEGATIVE

## 2010-08-01 LAB — HEMOGLOBIN A1C
Hgb A1c MFr Bld: 5.7 % — ABNORMAL HIGH (ref <5.7)
Mean Plasma Glucose: 117 mg/dL — ABNORMAL HIGH (ref <117)

## 2010-08-01 LAB — PROTIME-INR
INR: 0.97 (ref 0.00–1.49)
Prothrombin Time: 13.1 seconds (ref 11.6–15.2)

## 2010-08-01 LAB — ABO/RH: ABO/RH(D): O POS

## 2010-08-01 LAB — SURGICAL PCR SCREEN
MRSA, PCR: NEGATIVE
Staphylococcus aureus: NEGATIVE

## 2010-08-01 LAB — APTT: aPTT: 33 seconds (ref 24–37)

## 2010-08-02 ENCOUNTER — Ambulatory Visit: Payer: Medicare Other

## 2010-08-03 ENCOUNTER — Other Ambulatory Visit: Payer: Self-pay | Admitting: Thoracic Surgery (Cardiothoracic Vascular Surgery)

## 2010-08-03 ENCOUNTER — Inpatient Hospital Stay (HOSPITAL_COMMUNITY)
Admission: RE | Admit: 2010-08-03 | Discharge: 2010-08-08 | DRG: 220 | Disposition: A | Discharge status: Home / Self Care | Payer: Medicare Other | Source: Ambulatory Visit | Attending: Thoracic Surgery (Cardiothoracic Vascular Surgery) | Admitting: Thoracic Surgery (Cardiothoracic Vascular Surgery)

## 2010-08-03 ENCOUNTER — Inpatient Hospital Stay (HOSPITAL_COMMUNITY): Payer: Medicare Other

## 2010-08-03 DIAGNOSIS — I359 Nonrheumatic aortic valve disorder, unspecified: Secondary | ICD-10-CM

## 2010-08-03 DIAGNOSIS — Z952 Presence of prosthetic heart valve: Secondary | ICD-10-CM | POA: Insufficient documentation

## 2010-08-03 DIAGNOSIS — Z7982 Long term (current) use of aspirin: Secondary | ICD-10-CM

## 2010-08-03 DIAGNOSIS — N400 Enlarged prostate without lower urinary tract symptoms: Secondary | ICD-10-CM | POA: Diagnosis present

## 2010-08-03 DIAGNOSIS — Z79899 Other long term (current) drug therapy: Secondary | ICD-10-CM

## 2010-08-03 DIAGNOSIS — D62 Acute posthemorrhagic anemia: Secondary | ICD-10-CM | POA: Diagnosis not present

## 2010-08-03 DIAGNOSIS — E8779 Other fluid overload: Secondary | ICD-10-CM | POA: Diagnosis not present

## 2010-08-03 DIAGNOSIS — Q2111 Secundum atrial septal defect: Secondary | ICD-10-CM

## 2010-08-03 DIAGNOSIS — Z8774 Personal history of (corrected) congenital malformations of heart and circulatory system: Secondary | ICD-10-CM

## 2010-08-03 DIAGNOSIS — E876 Hypokalemia: Secondary | ICD-10-CM | POA: Diagnosis not present

## 2010-08-03 DIAGNOSIS — I4891 Unspecified atrial fibrillation: Secondary | ICD-10-CM | POA: Diagnosis not present

## 2010-08-03 DIAGNOSIS — Q211 Atrial septal defect: Secondary | ICD-10-CM

## 2010-08-03 DIAGNOSIS — M109 Gout, unspecified: Secondary | ICD-10-CM | POA: Diagnosis present

## 2010-08-03 DIAGNOSIS — I10 Essential (primary) hypertension: Secondary | ICD-10-CM | POA: Diagnosis present

## 2010-08-03 DIAGNOSIS — D6959 Other secondary thrombocytopenia: Secondary | ICD-10-CM | POA: Diagnosis not present

## 2010-08-03 HISTORY — DX: Presence of prosthetic heart valve: Z95.2

## 2010-08-03 HISTORY — DX: Personal history of (corrected) congenital malformations of heart and circulatory system: Z87.74

## 2010-08-03 LAB — POCT I-STAT 3, ART BLOOD GAS (G3+)
Acid-base deficit: 2 mmol/L (ref 0.0–2.0)
Acid-base deficit: 1 mmol/L (ref 0.0–2.0)
Acid-base deficit: 1 mmol/L (ref 0.0–2.0)
Bicarbonate: 24.1 mEq/L — ABNORMAL HIGH (ref 20.0–24.0)
Bicarbonate: 23 mEq/L (ref 20.0–24.0)
Bicarbonate: 20.9 mEq/L (ref 20.0–24.0)
O2 Saturation: 100 %
O2 Saturation: 99 %
O2 Saturation: 99 %
Patient temperature: 35.6
Patient temperature: 36.8
TCO2: 25 mmol/L (ref 0–100)
TCO2: 24 mmol/L (ref 0–100)
TCO2: 22 mmol/L (ref 0–100)
pCO2 arterial: 43.7 mmHg (ref 35.0–45.0)
pCO2 arterial: 34.4 mmHg — ABNORMAL LOW (ref 35.0–45.0)
pCO2 arterial: 25 mmHg — ABNORMAL LOW (ref 35.0–45.0)
pH, Arterial: 7.349 — ABNORMAL LOW (ref 7.350–7.450)
pH, Arterial: 7.427 (ref 7.350–7.450)
pH, Arterial: 7.529 — ABNORMAL HIGH (ref 7.350–7.450)
pO2, Arterial: 351 mmHg — ABNORMAL HIGH (ref 80.0–100.0)
pO2, Arterial: 155 mmHg — ABNORMAL HIGH (ref 80.0–100.0)
pO2, Arterial: 118 mmHg — ABNORMAL HIGH (ref 80.0–100.0)

## 2010-08-03 LAB — CBC
HCT: 31.4 % — ABNORMAL LOW (ref 39.0–52.0)
HCT: 27.6 % — ABNORMAL LOW (ref 39.0–52.0)
Hemoglobin: 11.1 g/dL — ABNORMAL LOW (ref 13.0–17.0)
Hemoglobin: 9.9 g/dL — ABNORMAL LOW (ref 13.0–17.0)
MCH: 32.2 pg (ref 26.0–34.0)
MCH: 32.4 pg (ref 26.0–34.0)
MCHC: 35.4 g/dL (ref 30.0–36.0)
MCHC: 35.9 g/dL (ref 30.0–36.0)
MCV: 91 fL (ref 78.0–100.0)
MCV: 90.2 fL (ref 78.0–100.0)
Platelets: 90 10*3/uL — ABNORMAL LOW (ref 150–400)
Platelets: 93 10*3/uL — ABNORMAL LOW (ref 150–400)
RBC: 3.45 MIL/uL — ABNORMAL LOW (ref 4.22–5.81)
RBC: 3.06 MIL/uL — ABNORMAL LOW (ref 4.22–5.81)
RDW: 13 % (ref 11.5–15.5)
RDW: 13 % (ref 11.5–15.5)
WBC: 14 10*3/uL — ABNORMAL HIGH (ref 4.0–10.5)
WBC: 12.6 10*3/uL — ABNORMAL HIGH (ref 4.0–10.5)

## 2010-08-03 LAB — POCT I-STAT 4, (NA,K, GLUC, HGB,HCT)
Glucose, Bld: 112 mg/dL — ABNORMAL HIGH (ref 70–99)
Glucose, Bld: 107 mg/dL — ABNORMAL HIGH (ref 70–99)
Glucose, Bld: 117 mg/dL — ABNORMAL HIGH (ref 70–99)
Glucose, Bld: 129 mg/dL — ABNORMAL HIGH (ref 70–99)
Glucose, Bld: 137 mg/dL — ABNORMAL HIGH (ref 70–99)
Glucose, Bld: 156 mg/dL — ABNORMAL HIGH (ref 70–99)
Glucose, Bld: 105 mg/dL — ABNORMAL HIGH (ref 70–99)
HCT: 33 % — ABNORMAL LOW (ref 39.0–52.0)
HCT: 25 % — ABNORMAL LOW (ref 39.0–52.0)
HCT: 31 % — ABNORMAL LOW (ref 39.0–52.0)
HCT: 26 % — ABNORMAL LOW (ref 39.0–52.0)
HCT: 26 % — ABNORMAL LOW (ref 39.0–52.0)
HCT: 27 % — ABNORMAL LOW (ref 39.0–52.0)
HCT: 32 % — ABNORMAL LOW (ref 39.0–52.0)
Hemoglobin: 11.2 g/dL — ABNORMAL LOW (ref 13.0–17.0)
Hemoglobin: 10.5 g/dL — ABNORMAL LOW (ref 13.0–17.0)
Hemoglobin: 8.5 g/dL — ABNORMAL LOW (ref 13.0–17.0)
Hemoglobin: 8.8 g/dL — ABNORMAL LOW (ref 13.0–17.0)
Hemoglobin: 8.8 g/dL — ABNORMAL LOW (ref 13.0–17.0)
Hemoglobin: 9.2 g/dL — ABNORMAL LOW (ref 13.0–17.0)
Hemoglobin: 10.9 g/dL — ABNORMAL LOW (ref 13.0–17.0)
Potassium: 3.8 mEq/L (ref 3.5–5.1)
Potassium: 3.9 mEq/L (ref 3.5–5.1)
Potassium: 4.1 mEq/L (ref 3.5–5.1)
Potassium: 5.3 mEq/L — ABNORMAL HIGH (ref 3.5–5.1)
Potassium: 5.4 mEq/L — ABNORMAL HIGH (ref 3.5–5.1)
Potassium: 4.4 mEq/L (ref 3.5–5.1)
Potassium: 3.8 mEq/L (ref 3.5–5.1)
Sodium: 141 mEq/L (ref 135–145)
Sodium: 135 mEq/L (ref 135–145)
Sodium: 140 mEq/L (ref 135–145)
Sodium: 136 mEq/L (ref 135–145)
Sodium: 140 mEq/L (ref 135–145)
Sodium: 139 mEq/L (ref 135–145)
Sodium: 139 mEq/L (ref 135–145)

## 2010-08-03 LAB — GLUCOSE, CAPILLARY
Glucose-Capillary: 122 mg/dL — ABNORMAL HIGH (ref 70–99)
Glucose-Capillary: 97 mg/dL (ref 70–99)
Glucose-Capillary: 87 mg/dL (ref 70–99)
Glucose-Capillary: 113 mg/dL — ABNORMAL HIGH (ref 70–99)
Glucose-Capillary: 115 mg/dL — ABNORMAL HIGH (ref 70–99)
Glucose-Capillary: 113 mg/dL — ABNORMAL HIGH (ref 70–99)
Glucose-Capillary: 128 mg/dL — ABNORMAL HIGH (ref 70–99)

## 2010-08-03 LAB — POCT I-STAT, CHEM 8
BUN: 11 mg/dL (ref 6–23)
Calcium, Ion: 1.09 mmol/L — ABNORMAL LOW (ref 1.12–1.32)
Chloride: 107 mEq/L (ref 96–112)
Creatinine, Ser: 0.7 mg/dL (ref 0.50–1.35)
Glucose, Bld: 105 mg/dL — ABNORMAL HIGH (ref 70–99)
HCT: 30 % — ABNORMAL LOW (ref 39.0–52.0)
Hemoglobin: 10.2 g/dL — ABNORMAL LOW (ref 13.0–17.0)
Potassium: 4 mEq/L (ref 3.5–5.1)
Sodium: 141 mEq/L (ref 135–145)
TCO2: 20 mmol/L (ref 0–100)

## 2010-08-03 LAB — APTT: aPTT: 42 seconds — ABNORMAL HIGH (ref 24–37)

## 2010-08-03 LAB — PROTIME-INR
INR: 1.39 (ref 0.00–1.49)
Prothrombin Time: 17.3 seconds — ABNORMAL HIGH (ref 11.6–15.2)

## 2010-08-03 LAB — HEMOGLOBIN AND HEMATOCRIT, BLOOD
HCT: 24.8 % — ABNORMAL LOW (ref 39.0–52.0)
Hemoglobin: 8.9 g/dL — ABNORMAL LOW (ref 13.0–17.0)

## 2010-08-03 LAB — PLATELET COUNT: Platelets: 116 10*3/uL — ABNORMAL LOW (ref 150–400)

## 2010-08-03 LAB — CREATININE, SERUM
Creatinine, Ser: 0.68 mg/dL (ref 0.50–1.35)
GFR calc Af Amer: >60 mL/min (ref >60)
GFR calc non Af Amer: >60 mL/min (ref >60)

## 2010-08-03 LAB — MAGNESIUM: Magnesium: 2.8 mg/dL — ABNORMAL HIGH (ref 1.5–2.5)

## 2010-08-04 ENCOUNTER — Inpatient Hospital Stay (HOSPITAL_COMMUNITY): Payer: Medicare Other

## 2010-08-04 LAB — POCT I-STAT, CHEM 8
BUN: 17 mg/dL (ref 6–23)
Calcium, Ion: 1.13 mmol/L (ref 1.12–1.32)
Chloride: 103 mEq/L (ref 96–112)
Creatinine, Ser: 1 mg/dL (ref 0.50–1.35)
Glucose, Bld: 157 mg/dL — ABNORMAL HIGH (ref 70–99)
HCT: 24 % — ABNORMAL LOW (ref 39.0–52.0)
Hemoglobin: 8.2 g/dL — ABNORMAL LOW (ref 13.0–17.0)
Potassium: 3.8 mEq/L (ref 3.5–5.1)
Sodium: 140 mEq/L (ref 135–145)
TCO2: 23 mmol/L (ref 0–100)

## 2010-08-04 LAB — BASIC METABOLIC PANEL
BUN: 13 mg/dL (ref 6–23)
CO2: 23 mEq/L (ref 19–32)
Calcium: 7.2 mg/dL — ABNORMAL LOW (ref 8.4–10.5)
Chloride: 107 mEq/L (ref 96–112)
Creatinine, Ser: 0.76 mg/dL (ref 0.50–1.35)
GFR calc Af Amer: >60 mL/min (ref >60)
GFR calc non Af Amer: >60 mL/min (ref >60)
Glucose, Bld: 127 mg/dL — ABNORMAL HIGH (ref 70–99)
Potassium: 3.6 mEq/L (ref 3.5–5.1)
Sodium: 137 mEq/L (ref 135–145)

## 2010-08-04 LAB — CBC
HCT: 22.3 % — ABNORMAL LOW (ref 39.0–52.0)
Hemoglobin: 8 g/dL — ABNORMAL LOW (ref 13.0–17.0)
MCH: 33.1 pg (ref 26.0–34.0)
MCHC: 35.9 g/dL (ref 30.0–36.0)
MCV: 92.1 fL (ref 78.0–100.0)
Platelets: 81 10*3/uL — ABNORMAL LOW (ref 150–400)
RBC: 2.42 MIL/uL — ABNORMAL LOW (ref 4.22–5.81)
RDW: 13.1 % (ref 11.5–15.5)
WBC: 10.4 10*3/uL (ref 4.0–10.5)

## 2010-08-04 LAB — GLUCOSE, CAPILLARY
Glucose-Capillary: 120 mg/dL — ABNORMAL HIGH (ref 70–99)
Glucose-Capillary: 165 mg/dL — ABNORMAL HIGH (ref 70–99)

## 2010-08-04 LAB — MAGNESIUM: Magnesium: 2.3 mg/dL (ref 1.5–2.5)

## 2010-08-04 NOTE — Op Note (Signed)
NAMEHAYDN, [name] NO.:  [id number]  MEDICAL RECORD NO.:  [id number]  LOCATION:  2314                         FACILITY:  [facility]  PHYSICIAN:  Salvatore Decent. [name], M.D. DATE OF BIRTH:  [birth year]  DATE OF PROCEDURE:  08/03/2010 DATE OF DISCHARGE:                              OPERATIVE REPORT   PREOPERATIVE DIAGNOSIS:  Severe aortic insufficiency.  POSTOPERATIVE DIAGNOSES: 1. Severe aortic insufficiency. 2. Patent foramen ovale.  PROCEDURE:  Right miniature anterior thoracotomy for aortic valve replacement (25-mm Edwards Magna-Ease pericardial tissue valve) and closure of patent foramen ovale.  SURGEON:  Salvatore Decent. [name], MD  ASSISTANT:  [name], PA-C  ANESTHESIA:  Burna Forts, MD  BRIEF CLINICAL NOTE:  The patient is a 75 year old gentleman from [place] with recently discovered aortic regurgitation.  The patient was recently discovered to have heart murmur on physical exam.  A 2-D echocardiogram revealed moderate-to-severe aortic regurgitation with normal left ventricular systolic function.  Cardiac catheterization performed by Dr. Donato Schultz reveals no significant coronary artery disease.  There was moderate-to-severe aortic insufficiency. Transesophageal echocardiogram confirmed the presence of severe aortic insufficiency.  CT angiogram of the chest reveals normal proximal aorta with no significant aneurysmal dilatation.  A full consultation note has been dictated previously.  The patient and his wife have been counseled regarding the indications, risks, and potential benefits of elective aortic valve replacement for treatment of aortic insufficiency. Alternative treatment strategies have been discussed in detail including continued close followup with medical therapy.  Alternative surgical approaches have been discussed and finally decision as to whether or not to replace his valve using a mechanical prosthesis or bioprosthetic tissue  valve has been reviewed in detail.  The patient specifically requests that his valve be replaced using a bioprosthetic tissue valve. He specifically requests minimally invasive approach if feasible.  He understands and accepts all associated risks of surgery and desires to proceed as described.  OPERATIVE FINDINGS: 1. Tricuspid native aortic valve with severe aortic insufficiency and     prolapsing of the left cusp of the valve. 2. Normal left ventricular systolic function. 3. Patent foramen ovale.  OPERATIVE PROCEDURE IN DETAIL:  The patient was brought to the operating room on the above-mentioned date and central monitoring was established by the Anesthesia team under the care and direction of Dr. Sharee Holster.  Specifically, a Swan-Ganz catheter was placed through the left internal jugular approach.  A radial arterial line was placed. Intravenous antibiotics were administered.  The patient was placed in the supine position on the operating table.  General endotracheal anesthesia was induced uneventfully.  A Foley catheter was placed.  Thepatient's right neck, chest, abdomen, both groins, and both lower extremities were prepared and draped in sterile manner.  Baseline transesophageal echocardiogram was performed by Dr. Jacklynn Bue. This confirmed the presence of severe aortic insufficiency with prolapsing of the left cusp of the valve as well as some annular dilatation.  The jet of aortic insufficiency was eccentric and blew along the anterior ventricular septum.  There was normal left ventricular systolic function.  There was trace mitral regurgitation. There appeared to be a small patent foramen valley.  No other significant abnormalities were noted.  A small incision was made in the right inguinal crease and the anterior surface of the right common femoral artery and right common femoral vein were dissected through the incision.  The femoral artery was normal  in appearance.  Single lung ventilation was begun.  Right miniature anterior thoracotomy incision was performed.  The incision was placed immediately anterior to the third costal cartilage.  The muscle fibers of the pectoralis major muscle were split longitudinally.  The third costal cartilages articulated off the sternum medially.  The right internal mammary artery and vein were doubly ligated and divided.  The right pleural space was entered.  The pericardium was opened.  A soft tissue retractor was placed.  Two 11-mm ports were placed inferiorly and the right pleural space was insufflated continuously with carbon dioxide gas through the posterior port.  Silk traction sutures were placed in the pericardium to facilitate exposure.  The patient was placed in Trendelenburg position.  The right internal jugular vein was cannulated with Seldinger technique and a flexible guidewire was advanced into the right atrium.  The patient was heparinized systemically.  Pursestring sutures were placed on the anterior surface of the right common femoral artery and the right common femoral vein at the greater saphenous bulb.  The right common femoral vein was cannulated with Seldinger technique through the greater saphenous bulb and a flexible guidewire was advanced under transesophageal echocardiogram guidance up through the right atrium. The femoral vein was dilated with serial dilators and cannulated with a 22-French long femoral venous cannula.  The right common femoral artery was cannulated with Seldinger technique and a guidewire was advanced until it could be appreciated intraluminally in the descending thoracic aorta on transesophageal echocardiogram.  The femoral artery was dilated with serial dilators and cannulated with an 18-French femoral arterial cannula.  The right internal jugular vein was dilated with serial dilators and cannulated with a 14-French pediatric femoral  venous cannula.  Cardiopulmonary bypass was begun.  A retrograde cardioplegic cannula was placed through the right atrium into the coronary sinus using transesophageal echocardiogram guidance.  A left ventricular vent was placed through the right superior pulmonary vein.  An antegrade cardioplegic cannula was placed low in the proximal aortic root.  The patient was cooled to 32 degrees systemic temperature.  Aortic crossclamp was applied directly across the aorta and cold blood cardioplegia was delivered initially in antegrade fashion through the aortic root.  Once ventricular fibrillation ensued, cardioplegia was administered retrograde through the coronary sinus catheter.  The initial cardioplegic arrest was rapid with early diastolic arrest after initiation of retrograde cardioplegia.  Repeat doses of cardioplegia were administered intermittently throughout the crossclamp portion of the operation retrograde through the coronary sinus catheter every 20 minutes to maintain completely flat electrocardiogram.  Myocardial protection was felt to be excellent.  The antegrade cardioplegic cannula was removed from the proximal aorta and the oblique transverse aortotomy incision was performed.  The aortic valve was inspected.  The aortic valve was tricuspid.  The leaflets of the aortic valve were thin and mobile.  There was severe prolapse of the leaflets beneath the left sinus of Valsalva.  An attempt to repair the valve was felt to be unwise and potentially unreliable.  The aortic valve leaflets were excised sharply.  The aortic annulus was sized to accept a 25-mm stented bioprosthetic tissue valve.  Aortic valve replacement was performed using interrupted 2-0 Ethibond horizontal mattress pledgeted sutures with pledgets in the subannular position.  An Edwards Magna-Ease pericardial tissue  valve (model number 3300TFX, size 25 mm, serial number [id number]) was secured in place uneventfully.   The valve seated nicely without difficulty.  The aortotomy was closed using a two-layer closure of running 4-0 Prolene suture.  A Magoon needle was placed directly in the proximal ascending aorta to serve as a root vent.  The inferior vena cava cannula was pulled down until the tip of the cannula and just below the entry of the right atrium.  A vessel loop was placed around the superior vena cava.  One final dose of warm retrograde hot shot cardioplegia was administered.  The aortic crossclamp was removed after a total crossclamp time of 85 minutes.  The heart began to beat spontaneously without need for cardioversion. The retrograde cardioplegic cannula was removed.  An oblique right atriotomy incision was performed.  The interatrial septum was examined. There was an obvious patent foramen ovale with a fairly large defect. This was closed with a running 4-0 Prolene over-and-over suture.  The right atriotomy incision was now closed with a two-layer closure of running 4-0 Prolene suture.  Epicardial pacing wire was fixed to the right ventricular outflow tract into the right atrial appendage.  The lungs were ventilated and heart allowed to fill.  The aortic root vent and Magoon the needle was removed.  Meticulous surgical hemostasis ascertained.  The left ventricular vent through the right superior pulmonary vein was now removed.  The patient was weaned from cardiopulmonary bypass without difficulty. The patient's rhythm at separation from bypass was AV paced.  Total cardiopulmonary bypass time for the operation was 152 minutes.  No inotropic support was required.  Followup transesophageal echocardiogram performed by Dr. Jacklynn Bue after separation from bypass demonstrated normal functioning aortic valve prosthesis.  There was no aortic insufficiency.  Left ventricular function was normal.  No other significant abnormalities were noted.  Previous communication between the left and right  atrium was gone.  Protamine was administered to reverse the anticoagulation.  The femoral arterial and venous cannula were both removed uneventfully.  There was a palpable pulse in the distal right femoral artery.  The right internal jugular cannula was removed and manual pressure was held on the right neck for 30 minutes.  Single lung ventilation was begun.  The aortotomy and right atriotomy incisions were inspected for hemostasis.  The pericardial space was drained with a 28-French Bard chest tube placed through the anterior port incision.  The pericardium was read closed with a patch of core matrix bovine submucosal tissue patch.  The right pleural space was irrigated with saline solution and drained with a 28-French Bard chest tube placed through the posterior port incision.  The minithoracotomy incision was closed in routine fashion using a small Synthes plate to reattach the third costal cartilage to the sternum medially.  Total of 5 screws were implanted to secure the plate in place.  The subcutaneous tissues anterior to the costal cartilage were closed in multiple layers after re-closing the pectoralis major anteriorly.  The skin was closed with subcuticular skin closure.  The right groin incision was closed in multiple layers in routine fashion and skin incision closed with subcuticular skin closure.  The patient tolerated the procedure well.  Both chest tubes were fixed to closed suction drainage device.  The patient was reintubated with a single-lumen endotracheal tube and transported to surgical intensive care unit in stable condition.  There were no intraoperative complications.  All sponge, instrument and needle counts were verified correct at completion of the  operation.  No blood products were administered.     Salvatore Decent. [name], M.D.     CHO/MEDQ  D:  08/03/2010  T:  08/04/2010  Job:  [id number]  cc:   Jake Bathe, MD Doris Cheadle Foy [place]ara, M.D.  Electronically  Signed by Tressie Stalker M.D. on 08/04/2010 01:20:21 PM

## 2010-08-04 NOTE — Op Note (Signed)
  NAME:  [name], [name] NO.:  [id number]  MEDICAL RECORD NO.:  [id number]           PATIENT TYPE:  LOCATION:                                 FACILITY:  PHYSICIAN:  Chucky May, M.D.  DATE OF BIRTH:  17-Dec-1935  DATE OF PROCEDURE:  02/22/2010 DATE OF DISCHARGE:                              OPERATIVE REPORT   PREOPERATIVE DIAGNOSIS:  Cataract, right eye.  POSTOPERATIVE DIAGNOSIS:  Cataract, right eye.  OPERATION PERFORMED:  Cataract extraction with intraocular lens implant, right eye.  INDICATIONS FOR SURGERY:  The patient is a 75 year old male with painless progressive decrease in vision so that he has difficulty seeing for his activities of reading and driving.  On examination, he was found to have a cataract consistent to decrease in visual acuity.  PROCEDURE:  The patient was taken to the [facility] where he was treated with laser performing a temporal incision with a superior side- port incision followed by an anterior capsulorrhexis and emulsification of the nucleus.  He was then taken to the main operating room where he was prepped and draped in the usual manner.  A lid speculum was inserted and the previously made corneal incision was opened as was the paracentesis site.  Viscoelastic was instilled into the anterior chamber and the capsulorrhexis flap was removed.  The nucleus was then mobilized by hydrodissection.  Phacoemulsification of the nucleus was performed without difficulty followed by removal of residual cortical material by irrigation and aspiration and polishing of the posterior capsule.  A model [id number] of 17.0-diopter power was then placed in the bag without difficulty.  The axis was aligned at axis 9.  The residual viscoelastic was removed by irrigation and aspiration.  The wounds were hydrated by Balanced Salt Solution and checked for fluid leaks and none were noted. The eye was dressed with topical Pred Forte and Vigamox and  a Fox shield and the patient was taken to recovery room in excellent condition where he received written and verbal instructions for his postoperative care and was scheduled for followup in 24 hours.          ______________________________ Chucky May, M.D.     DJD/MEDQ  D:  02/23/2010  T:  02/24/2010  Job:  [id number]  Electronically Signed by Nelson Chimes M.D. on 08/04/2010 08:50:54 AM

## 2010-08-05 ENCOUNTER — Inpatient Hospital Stay (HOSPITAL_COMMUNITY): Payer: Medicare Other

## 2010-08-05 LAB — BASIC METABOLIC PANEL
BUN: 18 mg/dL (ref 6–23)
CO2: 26 mEq/L (ref 19–32)
Calcium: 7.7 mg/dL — ABNORMAL LOW (ref 8.4–10.5)
Chloride: 107 mEq/L (ref 96–112)
Creatinine, Ser: 0.89 mg/dL (ref 0.50–1.35)
GFR calc Af Amer: >60 mL/min (ref >60)
GFR calc non Af Amer: >60 mL/min (ref >60)
Glucose, Bld: 121 mg/dL — ABNORMAL HIGH (ref 70–99)
Potassium: 3.4 mEq/L — ABNORMAL LOW (ref 3.5–5.1)
Sodium: 140 mEq/L (ref 135–145)

## 2010-08-05 LAB — CBC
HCT: 22.9 % — ABNORMAL LOW (ref 39.0–52.0)
Hemoglobin: 8.3 g/dL — ABNORMAL LOW (ref 13.0–17.0)
MCH: 33.6 pg (ref 26.0–34.0)
MCHC: 36.2 g/dL — ABNORMAL HIGH (ref 30.0–36.0)
MCV: 92.7 fL (ref 78.0–100.0)
Platelets: 76 10*3/uL — ABNORMAL LOW (ref 150–400)
RBC: 2.47 MIL/uL — ABNORMAL LOW (ref 4.22–5.81)
RDW: 13.4 % (ref 11.5–15.5)
WBC: 10.7 10*3/uL — ABNORMAL HIGH (ref 4.0–10.5)

## 2010-08-05 LAB — GLUCOSE, CAPILLARY
Glucose-Capillary: 125 mg/dL — ABNORMAL HIGH (ref 70–99)
Glucose-Capillary: 138 mg/dL — ABNORMAL HIGH (ref 70–99)
Glucose-Capillary: 140 mg/dL — ABNORMAL HIGH (ref 70–99)
Glucose-Capillary: 130 mg/dL — ABNORMAL HIGH (ref 70–99)
Glucose-Capillary: 138 mg/dL — ABNORMAL HIGH (ref 70–99)
Glucose-Capillary: 125 mg/dL — ABNORMAL HIGH (ref 70–99)
Glucose-Capillary: 139 mg/dL — ABNORMAL HIGH (ref 70–99)

## 2010-08-06 ENCOUNTER — Inpatient Hospital Stay (HOSPITAL_COMMUNITY): Payer: Medicare Other

## 2010-08-06 LAB — BASIC METABOLIC PANEL
BUN: 16 mg/dL (ref 6–23)
CO2: 28 mEq/L (ref 19–32)
Calcium: 8.1 mg/dL — ABNORMAL LOW (ref 8.4–10.5)
Chloride: 107 mEq/L (ref 96–112)
Creatinine, Ser: 0.79 mg/dL (ref 0.50–1.35)
GFR calc Af Amer: >60 mL/min (ref >60)
GFR calc non Af Amer: >60 mL/min (ref >60)
Glucose, Bld: 119 mg/dL — ABNORMAL HIGH (ref 70–99)
Potassium: 3.6 mEq/L (ref 3.5–5.1)
Sodium: 141 mEq/L (ref 135–145)

## 2010-08-06 LAB — CBC
HCT: 21.4 % — ABNORMAL LOW (ref 39.0–52.0)
Hemoglobin: 7.6 g/dL — ABNORMAL LOW (ref 13.0–17.0)
MCH: 33.2 pg (ref 26.0–34.0)
MCHC: 35.5 g/dL (ref 30.0–36.0)
MCV: 93.4 fL (ref 78.0–100.0)
Platelets: 81 10*3/uL — ABNORMAL LOW (ref 150–400)
RBC: 2.29 MIL/uL — ABNORMAL LOW (ref 4.22–5.81)
RDW: 13.8 % (ref 11.5–15.5)
WBC: 8.5 10*3/uL (ref 4.0–10.5)

## 2010-08-07 LAB — BASIC METABOLIC PANEL
BUN: 13 mg/dL (ref 6–23)
CO2: 26 mEq/L (ref 19–32)
Calcium: 8.4 mg/dL (ref 8.4–10.5)
Chloride: 109 mEq/L (ref 96–112)
Creatinine, Ser: 0.86 mg/dL (ref 0.50–1.35)
GFR calc Af Amer: >60 mL/min (ref >60)
GFR calc non Af Amer: >60 mL/min (ref >60)
Glucose, Bld: 110 mg/dL — ABNORMAL HIGH (ref 70–99)
Potassium: 3.9 mEq/L (ref 3.5–5.1)
Sodium: 141 mEq/L (ref 135–145)

## 2010-08-07 LAB — CBC
HCT: 21.8 % — ABNORMAL LOW (ref 39.0–52.0)
Hemoglobin: 7.6 g/dL — ABNORMAL LOW (ref 13.0–17.0)
MCH: 32.6 pg (ref 26.0–34.0)
MCHC: 34.9 g/dL (ref 30.0–36.0)
MCV: 93.6 fL (ref 78.0–100.0)
Platelets: 116 10*3/uL — ABNORMAL LOW (ref 150–400)
RBC: 2.33 MIL/uL — ABNORMAL LOW (ref 4.22–5.81)
RDW: 13.6 % (ref 11.5–15.5)
WBC: 7.1 10*3/uL (ref 4.0–10.5)

## 2010-08-08 LAB — PROTIME-INR
INR: 1.08 (ref 0.00–1.49)
Prothrombin Time: 14.2 seconds (ref 11.6–15.2)

## 2010-08-08 NOTE — Op Note (Signed)
NAME:  [name], [name] NO.:  [id number]  MEDICAL RECORD NO.:  [id number]  LOCATION:  2314                         FACILITY:  [facility]  PHYSICIAN:  Burna Forts, M.D.DATE OF BIRTH:  [birth year]  DATE OF PROCEDURE:  08/03/2010 DATE OF DISCHARGE:                              OPERATIVE REPORT   INDICATIONS FOR PROCEDURE:  Ms. Mogg presents today for minimally invasive aortic valve replacement to be performed by Dr. Tressie Stalker. He is brought to the holding area where under local anesthesia with sedation, radial arterial and pulmonary artery lines are inserted.  He is then taken to the OR for induction of general anesthesia, after which, the TEE probe is prepared, lubricated, and passed oropharyngeally to the stomach, then slightly withdrawn for imaging of the cardiac structures.  PRECARDIOPULMONARY BYPASS TEE EXAMINATION:  Left ventricle.  The left ventricular chamber is seen initially in the short-axis view.  There is mild slightly asymmetrical left ventricular hypertrophy appreciated. Overall, there is good contractility appreciated.  Ejection fraction estimated to be greater than 50%.  All segmental wall areas that are seen in the short-axis view thickened and are contractile.  The long- axis view shows good anterior and posterior wall contractile pattern. Papillary muscles are well outlined.  Mitral valve.  There is a thin, compliant, normal-appearing mitral valve apparatus and leaflets.  Coaptation point appears just below the level of the annulus.  It appears to be physiologically normal in its function.  There is trivial mitral regurgitant flow appreciated on color Doppler.  Aortic valve.  On further slightly withdrawing of the probe, the short- axis view of the aortic valve reveals a dilated annular area, a dilated aortic valve and sinus of Valsalva area as well as a dilated sinus tubular junction.  At the level of the aortic valve itself,  the diameters appreciated to be about 3.5-3.8 cm in diameter.  At the level of the sinuses of Valsalva, the diameter is greater than 4 cm.  The leaflets are easily visualized.  There are 3 cusps.  They are thin, compliant, and mobile, but lack coaptation centrally such that this is associated with a large central regurgitant jet seen on color Doppler. The aortic valve regurgitant jet is also visualized in the left ventricular outflow tract.  The width of this jet is approximately 60%- 70% of the width of the LVOT itself, indicating significant and severe aortic insufficiency.  Right ventricle.  This is a normally contractile, mildly dilated right ventricular chamber.  Right atrium and left atrium are normal chambers, both revealed no masses within the chambers themselves.  However, at the level of the anterior atrial septum, there is some aneurysmal dilatation of the interatrial septal area with bellowing from left to right.  Color Doppler also reveals too small left-to-right jets across the very thin membrane and aneurysmal dilatational area of this interatrial septum. This is appreciated as small PFO versus ASD.  Multiple images of this are obtained.  The patient underwent then a minimally invasive aortic valve replacement with a #25 tissue valve.  Also, a PFO is found and closed and repaired per Dr. [name].  The patient is rewarmed, de-airing maneuver is carried out, and the  patient is separated from cardiopulmonary bypass with the initial attempt.  POSTCARDIOPULMONARY BYPASS TEE EXAMINATION:  Left ventricle in the early bypass period.  There is good satisfactory contractile pattern appreciated in both short-axis and long-axis views of the left ventricular chamber.  Aortic valve.  In place of the original dilated aortic root and valve, there are now seen the small edges and leaflets of this pericardial tissue valve.  It is well seated, well placed.  Long-axis views  again revealed no obstruction to systolic ejection and no aortic insufficiency or jet into the alveolar tract which would be indicative of any aortic insufficiency.  This is a satisfactory repair.  Attention is then paid to the area of the interatrial septum in detail again with color Doppler which reveals an essentially closure of the previous small jets that were appreciated and this would be satisfactory repair of a PFO at this level of the interatrial septum.  The rest of the cardiac examination was as previously described without significant changes and the patient is returned to the cardiac intensive care unit in stable condition.          ______________________________ Burna Forts, M.D.     JTM/MEDQ  D:  08/03/2010  T:  08/04/2010  Job:  [id number]  Electronically Signed by Ester Rink M.D. on 08/08/2010 01:36:44 PM

## 2010-08-08 NOTE — H&P (Signed)
NAME:  [name], [name] NO.:  [id number]  MEDICAL RECORD NO.:  [id number]  LOCATION:                                 FACILITY:  PHYSICIAN:  Salvatore Decent. [name], M.D. DATE OF BIRTH:  March 18, 1935  DATE OF ADMISSION:  08/03/2010 DATE OF DISCHARGE:                             HISTORY & PHYSICAL   CHIEF COMPLAINT:  Dyspnea on exertion.  HISTORY OF PRESENT ILLNESS:  This is a 75 year old white male who was recently found to have severe aortic regurgitation requiring aortic valve replacement.  He was noted to have a murmur on physical exam by his primary care physician when he had some early complaints of fatiguability, as well as shortness of breath with exertion that he noted while he was in [place], [place] doing some walking.  He noted that when going up hills he was having more dyspnea with exertion than his usual stay in which he is fairly physically fit and active.  He has undergone extensive evaluation including 2-D echocardiogram, transesophageal echocardiogram, and cardiac catheterization.  His ejection fraction is 60-65%.  He had no significant coronary artery disease with only anatomical stenosis of 38-40% in the LAD.  A transesophageal echocardiogram has characterized that the aortic insufficiency as severe.  He has been seen on two occasions by Dr. [name] for preoperative evaluation.  On June 26, 2010, he underwent full consultation.  For full details of the anatomic and testing findings, please see his dictated notes.  He was also seen in followup on July 17, 2010.  Currently, the patient describes no new changes in his overall symptoms.  He in fact when on a 30-minute bike ride on today's date and felt good.  He denies chest pain.  He does have the shortness of breath as described with exertion.  He denies cough or sputum production.  He has no peripheral edema.  He has no urinary symptoms.  He denies diaphoresis associated with these events.  He denies  reflux.  He denies palpitations or history of arrhythmias.  PAST MEDICAL HISTORY: 1. Aortic insufficiency, severe as described. 2. Hypertension. 3. Gout. 4. History of previous small bowel obstruction treated conservatively. 5. History of benign prostatic hyperplasia.  PAST SURGICAL HISTORY: 1. Exploratory laparotomy in 2007 for bowel obstruction. 2. History of cholecystectomy. 3. History of right foot surgery in 2003. 4. History of left knee arthroscopy in 2006.  ALLERGIES:  SULFA.  CURRENT MEDICATIONS: 1. Aspirin 81 mg daily which has been on hold for the past few weeks. 2. Finasteride 5 mg p.o. daily. 3. Allopurinol 300 mg p.o. daily. 4. Hydrochlorothiazide 12.5 mg p.o. daily. 5. Atenolol 50 mg daily. 6. Vitamin B6 100 mg daily. 7. Calcium supplement daily. 8. Multivitamin daily. 9. He has recently started taking a medication over-the-counter called     [employer].  REVIEW OF SYMPTOMS:  See the history of present illness for pertinent positives and negatives.  Otherwise, he does have occasional dizziness with standing.  He does have decreased urinary stream he relates to his benign prostatic hyperplasia with no acute changes.  He has also stable chronic left foot numbness.  SOCIAL HISTORY:  He is married with two children.  He lives with his wife.  Tobacco used never, alcohol use approximately 1-2 alcoholic drinks per month.  OCCUPATION:  He is retired, formerly worked for [employer].  He lives in [place].  FAMILY HISTORY:  His mother is deceased from coronary artery disease. Father deceased from cancer of the brain.  PHYSICAL EXAMINATION:  VITAL SIGNS:  Blood pressure 130/73, pulse 62, respirations 18, temperature 98.9, oxygen saturation is 96% on room air. GENERAL APPEARANCE:  Is a 75 year old white male in no acute distress. HEENT:  Normocephalic, atraumatic.  Pupils equal, round, reactive to light.  Extraocular movements intact.  Oral mucosa is pink  and moist. He has no obvious lesions.  Sclerae anicteric.  Teeth appear to be in good condition. NECK:  Supple.  No jugular venous distention.  Carotids are palpable. He has no audible bruits.  He has no palpable lymphadenopathy.  Pharynx is clear without exudates or erythema. RESPIRATORY:  Symmetrical on inspiration, unlabored.  Clear breath sounds throughout.  No wheezes, rhonchi or crackles. CARDIAC:  Regular rate and rhythm.  He has a 2/6 systolic murmur.  No rubs.  No gallops. ABDOMEN:  Soft, nontender, nondistended.  Normoactive bowel sounds.  No masses, no bruits.  No hepatosplenomegaly.  His scar is well-healed from his midline exploratory laparotomy. GENITOURINARY/RECTAL:  Deferred at this time. EXTREMITIES:  No edema.  No varicosities.  No venous stasis changes.  No cyanosis, no mottling, no clubbing.  Peripheral pulses are equal and intact bilaterally.  NEUROLOGICAL:  Grossly nonfocal.  Alert and oriented x4.  Gait is steady.  Strength is equal bilaterally and symmetrical findings.  ASSESSMENT:  Severe aortic insufficiency for aortic valve replacement per Dr. [name] using minimally invasive technique on August 03, 2010.     [name], P.A.-C.   ______________________________ Salvatore Decent [name], M.D.    [name]  D:  08/02/2010  T:  08/02/2010  Job:  [id number]  cc:   Jake Bathe, MD Doris Cheadle Foy [place], M.D.  Electronically Signed by Gershon Crane P.A.-C. on 08/07/2010 02:52:06 PM Electronically Signed by Tressie Stalker M.D. on 08/08/2010 10:50:50 AM

## 2010-08-16 ENCOUNTER — Encounter (INDEPENDENT_AMBULATORY_CARE_PROVIDER_SITE_OTHER): Payer: Self-pay

## 2010-08-16 DIAGNOSIS — I359 Nonrheumatic aortic valve disorder, unspecified: Secondary | ICD-10-CM

## 2010-08-18 NOTE — Discharge Summary (Signed)
[name], [name] NO.:  [id number]  MEDICAL RECORD NO.:  [id number]  LOCATION:  2018                         FACILITY:  [facility]  PHYSICIAN:  [name]. [name], M.D. DATE OF BIRTH:  1935-04-04  DATE OF ADMISSION:  08/03/2010 DATE OF DISCHARGE:  08/08/2010                              DISCHARGE SUMMARY   HISTORY:  The patient is a 75 year old white male who was found recently to have severe aortic regurgitation, requiring aortic valve replacement. He was noted to have a murmur on physical exam by his primary care physician when he had had some complaints of fatigability as well as shortness of breath with exertion that was noted while he was in [place], [place], doing some walking.  At that time, he was going up some hills and was having more dyspnea on exertion than usual and he is fairly physically active and fit, so this concerned him.  He has undergone extensive evaluation including echocardiogram and catheterization.  His ejection fraction is 60%-65%.  He does have nonobstructive coronary artery disease with a 30%-40% stenosis in the LAD.  The transesophageal echocardiogram characterized the aortic insufficiency as severe.  He was seen and thoroughly evaluated by Dr. Cornelius Ayers and felt to be a candidate for aortic valve replacement.  He was admitted this hospitalization for the procedure.  PAST MEDICAL HISTORY:  Aortic insufficiency, severe as described.  Other diagnoses include hypertension, gout, history of previous small bowel obstruction, treated conservatively, history of benign prostatic hyperplasia.  PAST SURGICAL HISTORY:  Exploratory laparotomy in 2007 for bowel obstruction, history of cholecystectomy, history of right foot surgery, history of left knee arthroscopy.  ALLERGIES:  Include, SULFA.  MEDICATIONS PRIOR TO ADMISSION: 1. Aspirin 81 mg daily. 2. Finasteride 5 mg daily. 3. Allopurinol 300 mg daily. 4. Hydrochlorothiazide 12.5 mg  daily. 5. Atenolol 50 mg daily. 6. Vitamin B6 100 mg daily. 7. Calcium supplement daily. 8. Multivitamin daily. 9[employer]' Colon Health over-the-counter p.r.n.  REVIEW OF SYMPTOMS:  Please see the history and physical.  SOCIAL HISTORY:  He is married with two children.  He lives with his wife.  No tobacco use.  Alcohol use is minimal.  Occupation, he is retired, formerly worked for [employer].  He lives in [place].  FAMILY HISTORY:  Mother deceased from coronary artery disease.  Father deceased from cancer of the brain.  PHYSICAL EXAM:  Please see the history and physical done at the time of admission.  HOSPITAL COURSE:  The patient was admitted electively, and on July 04, 2010, he underwent the following procedure, right miniature anterior thoracotomy for aortic valve replacement using a 25-mm [facility] Ease pericardial tissue valve.  He was also found to have a patent foramen ovale, which was closed.  The patient tolerated the procedure well, was taken to the surgical intensive care unit in stable condition.  POSTOPERATIVE HOSPITAL COURSE:  The patient has done quite well.  He has maintained stable hemodynamics.  He has had postoperative atrial fibrillation, but has subsequently been chemically cardioverted to normal sinus rhythm with amiodarone.  He had a postoperative thrombocytopenia which is mild and improving.  His most recent platelet count on August 07, 2010 was 116,000.  He does have an acute blood loss anemia.  He has been started on iron.  He is asymptomatic.  His most recent hematocrit is 21.8, which is stable on August 07, 2010.  He is not felt to require transfusion at this time.  His incisions are healing well without evidence of infection.  He is tolerating gradual increase in activity, using standard protocols.  His oxygen has been weaned.  He maintains good saturations on room air.  Initially, there was some consideration of starting Coumadin, but since he has  not been in atrial fibrillation in over 24 hours, it is felt at this time that we can discontinue Coumadin use.  Overall, he is felt to be stable for discharge on today's date.  CONDITION ON DISCHARGE:  Stable and improved.  MEDICATIONS AT DISCHARGE:  Include the following: 1. Amiodarone 400 mg twice daily for 7 days, then 400 mg once daily. 2. Aspirin enteric-coated 325 mg tablet daily. 3. Iron complex 150 mg daily. 4. Oxycodone 5 mg one to two every 4-6 hours as needed for pain. 5. Allopurinol 300 mg daily. 6. Atenolol 50 mg q.a.m. 7. Calcium citrate one tablet daily. 8. Colon Health p.r.n. 9. Finasteride 5 mg daily. 10.Hydrochlorothiazide 12.5 mg daily. 11.Multivitamin one tablet daily. 12.Vitamin B complex daily.  FINAL DIAGNOSES: 1. Severe aortic insufficiency, status post aortic valve replacement,     also closure of patent foramen ovale. 2. Hypertension. 3. Gout. 4. History of previous small bowel obstruction. 5. Postoperative atrial fibrillation. 6. Postoperative thrombocytopenia. 7. Postoperative acute blood loss anemia.  PAST SURGERIES:  As listed above.  INSTRUCTIONS:  The patient will receive written instructions regarding medications, activity, diet, wound care, and follow-up.  Followup include Dr. Orvan July office on August 28, 2010.  Additionally, he will follow up with Dr. Anne Fu in 2 weeks.     [name], P.A.-C.   ______________________________ [name] [name], M.D.    [name]  D:  08/08/2010  T:  08/08/2010  Job:  [id number]  cc:   Gerald Bathe, MD [name], M.D.  Electronically Signed by [name] P.A.-C. on 08/16/2010 02:18:19 PM Electronically Signed by [name] M.D. on 08/18/2010 04:33:21 PM

## 2010-08-25 ENCOUNTER — Other Ambulatory Visit: Payer: Self-pay | Admitting: Thoracic Surgery (Cardiothoracic Vascular Surgery)

## 2010-08-25 DIAGNOSIS — I359 Nonrheumatic aortic valve disorder, unspecified: Secondary | ICD-10-CM

## 2010-08-28 ENCOUNTER — Encounter (INDEPENDENT_AMBULATORY_CARE_PROVIDER_SITE_OTHER): Payer: Self-pay

## 2010-08-28 ENCOUNTER — Ambulatory Visit
Admission: RE | Admit: 2010-08-28 | Discharge: 2010-08-28 | Disposition: A | Discharge status: Home / Self Care | Payer: Medicare Other | Source: Ambulatory Visit | Attending: Thoracic Surgery (Cardiothoracic Vascular Surgery) | Admitting: Thoracic Surgery (Cardiothoracic Vascular Surgery)

## 2010-08-28 DIAGNOSIS — I359 Nonrheumatic aortic valve disorder, unspecified: Secondary | ICD-10-CM

## 2010-08-29 NOTE — Assessment & Plan Note (Signed)
OFFICE VISIT  Gerald, Ayers DOB:  1935/05/01                                        August 28, 2010 CHART #:  [id number]  HISTORY:  The patient is a 75 year old gentleman who recently underwent right miniature anterior thoracotomy for aortic valve replacement using a 25-mm [facility] Magna Ease pericardial tissue valve as well as closure of a patent foramen ovale.  He had this done for: 1. Severe aortic insufficiency. 2. Patent foramen ovale.  The patient did quite well postoperatively.     He did have some atrial fibrillation.  But overall he did quite     well.  Recently, he has noted some difficulties controlling his     blood pressure.  He has had systolic ranges from the 130s up into     the 190s.  At home, he did a trial of a previous medication that he     had been taking terazosin 5 mg and this did substantially improve     his blood pressure; however, he did feel a little bit lightheaded     when taking it.  He denies fevers, chills, or other constitutional     symptoms.  He is improving in regard to his activity tolerance.  He     is anxious to resume some of his routine activities including some     light bike riding and some working out with light weights.  Chest x-ray was obtained on today's date.  It reveals clear lung fields. There are no significant effusions, infiltrates or evidence of pneumonia.  PHYSICAL EXAMINATION:  Vital Signs:  Blood pressure is 130/80, pulse 60, respirations 14, oxygen saturation is 97% on room air.  General Appearance:  A well-developed adult male in no acute distress. Pulmonary:  Reveals clear lungs bilaterally.  Cardiac:  Regular rate and rhythm.  Soft systolic flow murmur in the aortic region.  There is no gallops, no rubs.  His incisions are inspected and healing well without evidence of infection.  Extremities show no edema.  ASSESSMENT AND PLAN:  The patient is making excellent ongoing recovery. I will  give him prescription for 2 mg capsules of terazosin to be taken at night time to try to control his blood pressure better.  He also has an appointment in 2 weeks to see Dr. Anne Fu.  In regard to driving I told him, he can begin short trips during the daylight hours and build up over time as per our usual protocols.  I also told he can gently ride his bicycle in the neighborhood in an upright position as a very casual rate.  I told him, he can use his 10 pounds dumbbells gently.  We will see him again in the office in approximately 3-4 weeks for further check on his overall recovery.  [name], P.A.-C.  [name]  D:  08/28/2010  T:  08/29/2010  Job:  [id number]  cc:   [name], M.D.

## 2010-09-25 ENCOUNTER — Ambulatory Visit: Payer: Medicare Other | Admitting: Thoracic Surgery (Cardiothoracic Vascular Surgery)

## 2010-09-25 ENCOUNTER — Encounter: Payer: Self-pay | Admitting: Thoracic Surgery (Cardiothoracic Vascular Surgery)

## 2010-09-25 DIAGNOSIS — M109 Gout, unspecified: Secondary | ICD-10-CM | POA: Insufficient documentation

## 2010-09-25 DIAGNOSIS — Z87438 Personal history of other diseases of male genital organs: Secondary | ICD-10-CM

## 2010-09-25 DIAGNOSIS — Z8719 Personal history of other diseases of the digestive system: Secondary | ICD-10-CM

## 2010-09-25 DIAGNOSIS — I10 Essential (primary) hypertension: Secondary | ICD-10-CM

## 2010-09-25 DIAGNOSIS — I351 Nonrheumatic aortic (valve) insufficiency: Secondary | ICD-10-CM

## 2010-09-27 ENCOUNTER — Other Ambulatory Visit: Payer: Self-pay | Admitting: Thoracic Surgery (Cardiothoracic Vascular Surgery)

## 2010-09-27 DIAGNOSIS — I359 Nonrheumatic aortic valve disorder, unspecified: Secondary | ICD-10-CM

## 2010-10-02 ENCOUNTER — Ambulatory Visit
Admission: RE | Admit: 2010-10-02 | Discharge: 2010-10-02 | Disposition: A | Discharge status: Home / Self Care | Payer: Medicare Other | Source: Ambulatory Visit | Attending: Thoracic Surgery (Cardiothoracic Vascular Surgery) | Admitting: Thoracic Surgery (Cardiothoracic Vascular Surgery)

## 2010-10-02 ENCOUNTER — Ambulatory Visit (INDEPENDENT_AMBULATORY_CARE_PROVIDER_SITE_OTHER): Payer: Self-pay | Admitting: Thoracic Surgery (Cardiothoracic Vascular Surgery)

## 2010-10-02 ENCOUNTER — Encounter: Payer: Self-pay | Admitting: Thoracic Surgery (Cardiothoracic Vascular Surgery)

## 2010-10-02 VITALS — BP 139/82 | HR 52 | Resp 16

## 2010-10-02 DIAGNOSIS — I359 Nonrheumatic aortic valve disorder, unspecified: Secondary | ICD-10-CM | POA: Insufficient documentation

## 2010-10-02 NOTE — Patient Instructions (Signed)
Patient has been instructed to gradually increase his physical activity as tolerated. He has no particular limitations at this time.

## 2010-10-02 NOTE — Progress Notes (Signed)
  HPI: Patient returns for routine postoperative follow-up having undergone right miniature thoracotomy for aortic valve replacement using a 25 mm [facility] Ease pericardial tissue valve on 08/03/2010. The patient's early postoperative recovery while in the hospital was notable for transient postoperative atrial fibrillation. The patient's postoperative recovery has been otherwise uncomplicated. Since hospital discharge the patient reports progressing quite nicely. He was last seen here in our office on 08/28/2010. Since then he has done very well. He is now walking twice a day for 30 minutes at a time. His exercise tolerance is gradually improving. He no longer has any pain in his chest. He hopes to increase his activity level further. His blood pressure has remained under under fairly good control on his current medical regimen.   Current Outpatient Prescriptions  Medication Sig Dispense Refill  . allopurinol (ZYLOPRIM) 300 MG tablet Take 300 mg by mouth daily.        Marland Kitchen aspirin 81 MG tablet Take 160 mg by mouth daily.       Marland Kitchen atenolol (TENORMIN) 50 MG tablet Take 50 mg by mouth daily.        . calcium citrate-vitamin D (CITRACAL+D) 315-200 MG-UNIT per tablet Take 1 tablet by mouth 2 (two) times daily.        . finasteride (PROSCAR) 5 MG tablet Take 5 mg by mouth daily.        . hydrochlorothiazide (MICROZIDE) 12.5 MG capsule Take 12.5 mg by mouth daily.        . Multiple Vitamin (MULTIVITAMIN) capsule Take 1 capsule by mouth daily.        . Probiotic Product (ALIGN PO) Take 1 capsule by mouth 1 day or 1 dose.        . pyridOXINE (VITAMIN B-6) 100 MG tablet Take 100 mg by mouth daily.        Marland Kitchen terazosin (HYTRIN) 5 MG capsule Take 5 mg by mouth at bedtime.          Physical Exam: On physical examination the patient looks quite good. His minithoracotomy incision has healed completely. Breath sounds are clear to auscultation and symmetrical bilaterally. Cardiovascular exam is notable for  regular rate and rhythm. No murmurs rubs or gallops are noted. The extremities are warm and well-perfused. There is no lower extremity edema. The right groin incision has healed completely.  Diagnostic Tests: Chest x-ray performed today is reviewed. Lung fields are clear. There are no pleural effusions. Cardiac silhouette is normal. No significant abnormalities are noted.  Impression: The patient is doing very well now more than 6 weeks following aortic valve replacement via right minithoracotomy.  Plan: I have encouraged the patient to continue to increase his physical activity as tolerated. At this point he has no particular physical limitations. I've cautioned him to gradually increase his physical activity as tolerated. We will plan to see him back in 4 months for final followup.

## 2011-02-05 ENCOUNTER — Encounter: Payer: Self-pay | Admitting: Thoracic Surgery (Cardiothoracic Vascular Surgery)

## 2011-02-05 ENCOUNTER — Ambulatory Visit (INDEPENDENT_AMBULATORY_CARE_PROVIDER_SITE_OTHER): Payer: Medicare Other | Admitting: Thoracic Surgery (Cardiothoracic Vascular Surgery)

## 2011-02-05 VITALS — BP 138/85 | HR 54 | Resp 16 | Ht 72.0 in | Wt 189.0 lb

## 2011-02-05 DIAGNOSIS — Z954 Presence of other heart-valve replacement: Secondary | ICD-10-CM

## 2011-02-05 DIAGNOSIS — I351 Nonrheumatic aortic (valve) insufficiency: Secondary | ICD-10-CM

## 2011-02-05 DIAGNOSIS — Z09 Encounter for follow-up examination after completed treatment for conditions other than malignant neoplasm: Secondary | ICD-10-CM

## 2011-02-05 DIAGNOSIS — I359 Nonrheumatic aortic valve disorder, unspecified: Secondary | ICD-10-CM

## 2011-02-05 DIAGNOSIS — Q211 Atrial septal defect: Secondary | ICD-10-CM

## 2011-02-05 DIAGNOSIS — Z952 Presence of prosthetic heart valve: Secondary | ICD-10-CM

## 2011-02-05 NOTE — Progress Notes (Signed)
[address]            [place] [address]          [phone]     CARDIOTHORACIC SURGERY CONSULTATION REPORT  PCP is FRIED, Doris Cheadle, MD, MD Referring Provider is Donato Schultz, MD  Chief Complaint  Patient presents with  . Routine Post Op    MINI AVR(TISSUE), CLOSURE OF PATENT FORAMEN OVALE...4 MONTH    HPI:  Patient returns for routine followup now 5 months status post aortic valve replacement via right miniature thoracotomy. He was last seen here in the office 08/28/2010. Since then patient has continued to do quite well. He is very active physically and reports that he only gets tired if she really pushes himself with strenuous labor. He has no residual pain or soreness in his chest and he is back doing pushups every day as he had done for many years prior to surgery. He has no complaints.  Past Medical History  Diagnosis Date  . Aortic insufficiency   . HTN (hypertension)   . Gout   . History of small bowel obstruction   . History of BPH     Past Surgical History  Procedure Date  . Exploratory laparotomy in 2007 for bowel obstruction   . Cholecystectomy   . Right foot surgery   . Left knee arthroscopy   . Aortic valve replacement 08/04/2010    #25 Magna Ease pericardial tissue valve via right mini thoracotomy - Dr. [name]    Family History  Problem Relation Age of Onset  . Heart disease Mother   . Brain cancer Father     Social History History  Substance Use Topics  . Smoking status: Never Smoker   . Smokeless tobacco: Never Used  . Alcohol Use: Yes     ONE OR TWO PER MONTH    Current Outpatient Prescriptions  Medication Sig Dispense Refill  . allopurinol (ZYLOPRIM) 300 MG tablet Take 300 mg by mouth daily.        Marland Kitchen aspirin 81 MG tablet Take 160 mg by mouth daily.       Marland Kitchen atenolol (TENORMIN) 50 MG tablet Take 50 mg by mouth daily.        . calcium citrate-vitamin D (CITRACAL+D) 315-200 MG-UNIT per tablet Take 1 tablet  by mouth 2 (two) times daily.        . finasteride (PROSCAR) 5 MG tablet Take 5 mg by mouth daily.        . hydrochlorothiazide (MICROZIDE) 12.5 MG capsule Take 12.5 mg by mouth daily.        Marland Kitchen lisinopril (PRINIVIL,ZESTRIL) 5 MG tablet Take 5 mg by mouth daily.      . Multiple Vitamin (MULTIVITAMIN) capsule Take 1 capsule by mouth daily.        . Probiotic Product (ALIGN PO) Take 1 capsule by mouth 1 day or 1 dose.        . pyridOXINE (VITAMIN B-6) 100 MG tablet Take 100 mg by mouth daily.        Marland Kitchen terazosin (HYTRIN) 5 MG capsule Take 5 mg by mouth at bedtime.          Allergies  Allergen Reactions  . Sulfa Antibiotics Other (See Comments)    GRANULOTOSIS    Review of Systems:  General:  normal appetite, normal energy   Respiratory:  no cough, no wheezing,  no hemoptysis, no pain with inspiration or cough, no shortness of breath   Cardiac:   no chest pain or tightness, no exertional SOB, no resting SOB, no PND, no orthopnea, no LE edema, no palpitations, no syncope  GI:   no difficulty swallowing, no hematochezia, no hematemesis, no melena, no constipation, no diarrhea   GU:   no dysuria, no urgency, no frequency   Musculoskeletal: no arthritis, no arthralgia   Vascular:  no pain suggestive of claudication   Neuro:   no symptoms suggestive of TIA's, no seizures, no headaches, no peripheral neuropathy   Endocrine:  Negative   HEENT:  no loose teeth or painful teeth,  no recent vision changes  Psych:   no anxiety, no depression    Physical Exam:   BP 138/85  Pulse 54  Resp 16  Ht 6' (1.829 m)  Wt 189 lb (85.73 kg)  BMI 25.63 kg/m2  SpO2 98%  General:    well-appearing  HEENT:  Unremarkable   Neck:   no JVD, no bruits, no adenopathy   Chest:   clear to auscultation, symmetrical breath sounds, no wheezes, no rhonchi - mini thoracotomy incision is well-healed  CV:   RRR, no murmur   Abdomen:  soft, non-tender, no masses   Extremities:  warm, well-perfused, pulses    Rectal/GU  Deferred  Neuro:   Grossly non-focal and symmetrical throughout  Skin:   Clean and dry, no rashes, no breakdown  Diagnostic Tests:  n/a  Impression:  Doing well 5 months s/p AVR via right mini thoracotomy.  Mr Thorington isn't sure whether or not he has had a follow up ECHO since surgery.  Plan:  In the future Mr. Fatima will call and return to see [place] as needed. I've suggested that he probably should have a baseline followup echocardiogram at Dr. Anne Fu office is 1 has not already been performed. All of his questions have been addressed.    Salvatore Decent. [name], MD 02/05/2011 10:21 AM

## 2011-03-19 ENCOUNTER — Emergency Department (HOSPITAL_COMMUNITY)
Admission: EM | Admit: 2011-03-19 | Discharge: 2011-03-20 | Disposition: A | Discharge status: Home / Self Care | Payer: Medicare Other | Attending: Emergency Medicine | Admitting: Emergency Medicine

## 2011-03-19 ENCOUNTER — Emergency Department (HOSPITAL_COMMUNITY): Payer: Medicare Other

## 2011-03-19 ENCOUNTER — Encounter (HOSPITAL_COMMUNITY): Payer: Self-pay | Admitting: *Deleted

## 2011-03-19 DIAGNOSIS — I10 Essential (primary) hypertension: Secondary | ICD-10-CM | POA: Insufficient documentation

## 2011-03-19 DIAGNOSIS — R112 Nausea with vomiting, unspecified: Secondary | ICD-10-CM | POA: Insufficient documentation

## 2011-03-19 DIAGNOSIS — R5381 Other malaise: Secondary | ICD-10-CM | POA: Insufficient documentation

## 2011-03-19 DIAGNOSIS — Z79899 Other long term (current) drug therapy: Secondary | ICD-10-CM | POA: Insufficient documentation

## 2011-03-19 LAB — BASIC METABOLIC PANEL
BUN: 26 mg/dL — ABNORMAL HIGH (ref 6–23)
CO2: 24 mEq/L (ref 19–32)
Calcium: 9.3 mg/dL (ref 8.4–10.5)
Chloride: 103 mEq/L (ref 96–112)
Creatinine, Ser: 0.95 mg/dL (ref 0.50–1.35)
GFR calc Af Amer: >90 mL/min (ref >90)
GFR calc non Af Amer: 81 mL/min — ABNORMAL LOW (ref >90)
Glucose, Bld: 151 mg/dL — ABNORMAL HIGH (ref 70–99)
Potassium: 3.9 mEq/L (ref 3.5–5.1)
Sodium: 139 mEq/L (ref 135–145)

## 2011-03-19 LAB — CBC
HCT: 39.8 % (ref 39.0–52.0)
Hemoglobin: 14.2 g/dL (ref 13.0–17.0)
MCH: 33.6 pg (ref 26.0–34.0)
MCHC: 35.7 g/dL (ref 30.0–36.0)
MCV: 94.1 fL (ref 78.0–100.0)
Platelets: 172 10*3/uL (ref 150–400)
RBC: 4.23 MIL/uL (ref 4.22–5.81)
RDW: 13.7 % (ref 11.5–15.5)
WBC: 8 10*3/uL (ref 4.0–10.5)

## 2011-03-19 MED ORDER — ONDANSETRON HCL 4 MG/2ML IJ SOLN
4.0000 mg | Freq: Once | INTRAMUSCULAR | Status: AC
  Administered 2011-03-19: 4 mg via INTRAVENOUS
  Filled 2011-03-19: qty 2

## 2011-03-19 MED ORDER — SODIUM CHLORIDE 0.9 % IV BOLUS (SEPSIS)
1000.0000 mL | Freq: Once | INTRAVENOUS | Status: AC
  Administered 2011-03-19: 1000 mL via INTRAVENOUS

## 2011-03-19 NOTE — ED Notes (Signed)
Pt in c/o vomiting since 5pm today, denies diarrhea or abd pain, states he felt tired this afternoon

## 2011-03-20 MED ORDER — ONDANSETRON 8 MG PO TBDP: 8.0000 mg | ORAL_TABLET | Freq: Three times a day (TID) | ORAL | Status: AC | PRN

## 2011-03-20 NOTE — ED Provider Notes (Signed)
History     CSN: [id number]  Arrival date & time 03/19/11  8295   First MD Initiated Contact with Patient 03/19/11 2312      Chief Complaint  Patient presents with  . Emesis    The history is provided by the patient.   the patient reports developing generalized weakness today followed by nausea and vomiting.  His had no diarrhea.  Denies chest pain and shortness of breath.  He has no fever or chills.  He is a history of bowel obstruction before in the past.  He has had abdominal surgery.  He reports some abdominal cramping earlier.  He denies dysuria or urinary frequency.  He denies flank pain.  He reports since arriving at the ER his nausea has improved.  He still has no abdominal pain at this time.  Nothing worsens the symptoms.  Nothing improves his symptoms.  His symptoms are constant and now improving.  He reports decreased oral intake today  Past Medical History  Diagnosis Date  . Aortic insufficiency   . HTN (hypertension)   . Gout   . History of small bowel obstruction   . History of BPH     Past Surgical History  Procedure Date  . Exploratory laparotomy in 2007 for bowel obstruction   . Cholecystectomy   . Right foot surgery   . Left knee arthroscopy   . Aortic valve replacement 08/04/2010    #25 Magna Ease pericardial tissue valve via right mini thoracotomy - Dr. [name]    Family History  Problem Relation Age of Onset  . Heart disease Mother   . Brain cancer Father     History  Substance Use Topics  . Smoking status: Never Smoker   . Smokeless tobacco: Never Used  . Alcohol Use: Yes     ONE OR TWO PER MONTH      Review of Systems  Gastrointestinal: Positive for vomiting.  All other systems reviewed and are negative.    Allergies  Sulfa antibiotics  Home Medications   Current Outpatient Rx  Name Route Sig Dispense Refill  . ALLOPURINOL 300 MG PO TABS Oral Take 300 mg by mouth daily.      . ASPIRIN 81 MG PO TABS Oral Take 81 mg by mouth daily.      . ATENOLOL 50 MG PO TABS Oral Take 50 mg by mouth daily.      Marland Kitchen CALCIUM CITRATE-VITAMIN D 315-200 MG-UNIT PO TABS Oral Take 1 tablet by mouth daily.     Marland Kitchen FINASTERIDE 5 MG PO TABS Oral Take 5 mg by mouth daily.      Marland Kitchen HYDROCHLOROTHIAZIDE 12.5 MG PO CAPS Oral Take 12.5 mg by mouth daily.      Marland Kitchen LISINOPRIL 5 MG PO TABS Oral Take 5 mg by mouth daily.    . MULTIVITAMINS PO CAPS Oral Take 1 capsule by mouth daily.      Marland Kitchen ALIGN PO Oral Take 1 capsule by mouth 1 day or 1 dose.      Marland Kitchen VITAMIN B-6 100 MG PO TABS Oral Take 100 mg by mouth daily.      Marland Kitchen TERAZOSIN HCL 5 MG PO CAPS Oral Take 5 mg by mouth at bedtime.        BP 114/80  Pulse 78  Temp(Src) 98.2 F (36.8 C) (Oral)  Resp 10  SpO2 95%  Physical Exam  Nursing note and vitals reviewed. Constitutional: He is oriented to person, place, and time. He appears well-developed  and well-nourished.  HENT:  Head: Normocephalic and atraumatic.  Eyes: EOM are normal.  Neck: Normal range of motion.  Cardiovascular: Normal rate, regular rhythm, normal heart sounds and intact distal pulses.   Pulmonary/Chest: Effort normal and breath sounds normal. No respiratory distress.  Abdominal: Soft. Bowel sounds are normal. He exhibits no distension. There is no tenderness. There is no rebound and no guarding.  Musculoskeletal: Normal range of motion.  Neurological: He is alert and oriented to person, place, and time.  Skin: Skin is warm and dry.  Psychiatric: He has a normal mood and affect. Judgment normal.    ED Course  Procedures (including critical care time)  Labs Reviewed  BASIC METABOLIC PANEL - Abnormal; Notable for the following:    Glucose, Bld 151 (*)    BUN 26 (*)    GFR calc non Af Amer 81 (*)    All other components within normal limits  CBC   Dg Abd 2 Views  03/19/2011  *RADIOLOGY REPORT*  Clinical Data: Nausea, vomiting, weakness  ABDOMEN - 2 VIEW  Comparison: 09/21/2009  Findings: Lung bases clear.  Postop findings of the chest  and abdomen.  Scattered air and stool throughout the bowel. Nonspecific air-fluid levels diffusely.  No free air evident. Developing mild partial obstruction not entirely excluded. Previous cholecystectomy evident.  Bilateral nephrolithiasis again demonstrated.  Small radiopaque calculus in the left abdomen measuring 10 x 4 mm at the L2-3 level along the ileal psoas shadow.  This is new since the prior study and a left ureteral calculus is not entirely excluded.  Degenerative changes of the spine.  Pelvic calcifications likely venous phleboliths.  IMPRESSION: Nonspecific scattered air fluid levels with slight small bowel distention, partial obstruction not entirely excluded.  No free air.  Nephrolithiasis  New left abdominal calcification at the L2-3 level, ureteral calculus not excluded  Original Report Authenticated By: Judie Petit. Ruel Favors, M.D.     1. Nausea and vomiting       MDM  The patient presented with nausea and vomiting and a history of bowel obstruction.  His x-ray suggests partial small bowel obstruction.  He reports feeling much better at this time.  He is no longer distended on exam.  He is tolerating oral fluids in the ER.  I suspect this is an early partial small bowel obstruction which seems to be resolving already.  The patient is very much in going home.  He tolerated oral fluids in the ER.  He'll be sent home on 24 hours of oral fluids and then to slowly advance his diet after that.  He understands to return to the ER for new or worsening symptoms.  He knows to return for worsening vomiting or abdominal pain.         Lyanne Co, MD 03/20/11 [phone]

## 2012-12-10 ENCOUNTER — Other Ambulatory Visit: Payer: Self-pay | Admitting: Dermatology

## 2013-11-05 ENCOUNTER — Other Ambulatory Visit: Payer: Self-pay | Admitting: Dermatology

## 2016-03-25 ENCOUNTER — Telehealth: Payer: Self-pay | Admitting: Cardiovascular Disease

## 2016-03-25 NOTE — Telephone Encounter (Signed)
Received call from Dr Drema Dallas regarding [name]. Pt with recent dizziness, new EKG changes with lateral T wave inversion, and hx of aortic valve replacement with no cardiology FU in several years. Pt currently asymptomatic. Will arrange next available FU with Dr Marlou Porch who has previously seen him.

## 2016-03-26 ENCOUNTER — Telehealth: Payer: Self-pay | Admitting: *Deleted

## 2016-03-26 NOTE — Telephone Encounter (Signed)
Faxed referral from Leighton Ruff, MD Sadie Haber at [facility] sent to scheduling

## 2016-03-30 ENCOUNTER — Encounter: Payer: Self-pay | Admitting: Nurse Practitioner

## 2016-04-10 NOTE — Progress Notes (Signed)
CARDIOLOGY OFFICE NOTE  Date:  04/11/2016    [name] Date of Birth: 1935-08-21 Medical Record #[id number]  PCP:  Gerrit Heck, MD  Cardiologist:  Marlou Porch - will be following with me.   Chief Complaint  Patient presents with  . Cardiac Valve Problem    New patient/re-establish - previously saw Dr. Marlou Porch    History of Present Illness: [name] is a 81 y.o. male who presents today for a new patient/re-establish visit. Previously seen by Dr. Marlou Porch.   He has a history of HTN, prior AVR for severe AI back in 2012 - minimally invasive by Dr. Roxy Manns - to be on SBE. Other issues are gout, prior instestinal obstruction, elevated PSA, neuropathy, borderline DM and orthostatic hypotension. Cardiac cath prior to his AVR showed minor CAD - 30 to 40% proximal LAD most notable with normal EF.    Has not been seen here in several years.   Referred back here by his PCP for dizziness in the setting of AVR. Dr. Drema Dallas called here last month due to new EKG changes.    Comes in today. Here with his wife. Both are a little concerned that he was lost in follow up. Says "never got a call to come back". Says he is feeling ok. He will have some dizziness - related to orthostatic hypotension - BP typically ok. Does like salt. No chest pain. Breathing is good. He remains active. Likes to box - his wife got him a punching bag for Christmas. He is active in the yard. Does do SBE. No spells whatsoever of chest pain. HR is low - he is on beta blocker therapy. Lipids have been ok - not on therapy.    Past Medical History:  Diagnosis Date  . Aortic insufficiency   . Gout   . History of BPH   . History of small bowel obstruction   . HTN (hypertension)     Past Surgical History:  Procedure Laterality Date  . AORTIC VALVE REPLACEMENT  08/04/2010   #25 Magna Ease pericardial tissue valve via right mini thoracotomy - Dr. Roxy Manns  . CHOLECYSTECTOMY    . EXPLORATORY LAPAROTOMY IN 2007  FOR BOWEL OBSTRUCTION    . LEFT KNEE ARTHROSCOPY    . RIGHT FOOT SURGERY       Medications: Current Outpatient Prescriptions  Medication Sig Dispense Refill  . allopurinol (ZYLOPRIM) 300 MG tablet Take 300 mg by mouth daily.      Marland Kitchen amLODipine (NORVASC) 5 MG tablet Take 5 mg by mouth daily.     Marland Kitchen amoxicillin (AMOXIL) 500 MG capsule Take 2,000 mg by mouth as needed (1 hour prior before dental procedures).     Marland Kitchen aspirin 81 MG tablet Take 81 mg by mouth daily.     Marland Kitchen atenolol (TENORMIN) 50 MG tablet Take 50 mg by mouth daily.      . finasteride (PROSCAR) 5 MG tablet Take 5 mg by mouth daily.      Marland Kitchen pyridOXINE (VITAMIN B-6) 100 MG tablet Take 100 mg by mouth daily.      . tamsulosin (FLOMAX) 0.4 MG CAPS capsule      No current facility-administered medications for this visit.     Allergies: Allergies  Allergen Reactions  . Sulfa Antibiotics Other (See Comments)    GRANULOTOSIS    Social History: The patient  reports that he has never smoked. He has never used smokeless tobacco. He reports that he drinks alcohol. He reports  that he does not use drugs.   Family History: The patient's family history includes Brain cancer in his father; Heart disease in his mother.   Review of Systems: Please see the history of present illness.   Otherwise, the review of systems is positive for none.   All other systems are reviewed and negative.   Physical Exam: VS:  BP (!) 154/90   Pulse (!) 48   Ht 6' (1.829 m)   Wt 190 lb 6.4 oz (86.4 kg)   BMI 25.82 kg/m  .  BMI Body mass index is 25.82 kg/m.  Wt Readings from Last 3 Encounters:  04/11/16 190 lb 6.4 oz (86.4 kg)  02/05/11 189 lb (85.7 kg)   BP recheck by me is down to 130/80 both sitting and standing.   General: Pleasant. Well developed, well nourished and in no acute distress. He looks younger than his stated age.   HEENT: Normal.  Neck: Supple, no JVD, carotid bruits, or masses noted.  Cardiac: Regular rate and rhythm. Soft outflow  murmur. No edema.  Respiratory:  Lungs are clear to auscultation bilaterally with normal work of breathing.  GI: Soft and nontender.  MS: No deformity or atrophy. Gait and ROM intact.  Skin: Warm and dry. Color is normal.  Neuro:  Strength and sensation are intact and no gross focal deficits noted.  Psych: Alert, appropriate and with normal affect.   LABORATORY DATA:  EKG:  EKG is ordered today. This demonstrates sinus bradycardia.  EKG from PCP shows sinus bradycardia with diffuse T wave changes.   Lab Results  Component Value Date   WBC 8.0 03/19/2011   HGB 14.2 03/19/2011   HCT 39.8 03/19/2011   PLT 172 03/19/2011   GLUCOSE 151 (H) 03/19/2011   ALT 19 08/01/2010   AST 21 08/01/2010   NA 139 03/19/2011   K 3.9 03/19/2011   CL 103 03/19/2011   CREATININE 0.95 03/19/2011   BUN 26 (H) 03/19/2011   CO2 24 03/19/2011   INR 1.08 08/08/2010   HGBA1C 5.7 (H) 08/01/2010    BNP (last 3 results) No results for input(s): BNP in the last 8760 hours.  ProBNP (last 3 results) No results for input(s): PROBNP in the last 8760 hours.   Other Studies Reviewed Today:   Assessment/Plan:  1. Abnormal EKG - has diffuse T wave changes - new from prior EKG dating back to 2012. No other EKG to compare to other than the EKG from PCP last month. He is asymptomatic - no chest pain. He is on beta blocker which I think explains the bradycardia. Will arrange for stress Myoview and echocardiogram. Will see back for discussion.   2. Prior AVR for severe AI - needs echo updated. He does SBE.   3. Nonobstructive CAD per cath from 2012 - would manage with CV risk factor modification - arranging for stress Myoview.   4. HTN - recheck by me is ok. He has noted orthostatic hypotension. Not demonstrated here today.   5. HLD - I do not see recent lipids.   Current medicines are reviewed with the patient today.  The patient does not have concerns regarding medicines other than what has been noted  above.  The following changes have been made:  See above.  Labs/ tests ordered today include:    Orders Placed This Encounter  Procedures  . Myocardial Perfusion Imaging  . EKG 12-Lead  . ECHOCARDIOGRAM COMPLETE     Disposition:   FU with me  for further discussion. He wishes to follow with me going forward.   Patient is agreeable to this plan and will call if any problems develop in the interim.   SignedTruitt Merle, NP  04/11/2016 3:57 PM  [facility] [address] [place], [place]  [address] Phone: [phone] Fax: [phone]

## 2016-04-11 ENCOUNTER — Encounter (INDEPENDENT_AMBULATORY_CARE_PROVIDER_SITE_OTHER): Payer: Self-pay

## 2016-04-11 ENCOUNTER — Ambulatory Visit (INDEPENDENT_AMBULATORY_CARE_PROVIDER_SITE_OTHER): Payer: Medicare Other | Admitting: Nurse Practitioner

## 2016-04-11 ENCOUNTER — Encounter: Payer: Self-pay | Admitting: Nurse Practitioner

## 2016-04-11 VITALS — BP 154/90 | HR 48 | Ht 72.0 in | Wt 190.4 lb

## 2016-04-11 DIAGNOSIS — Z952 Presence of prosthetic heart valve: Secondary | ICD-10-CM | POA: Diagnosis not present

## 2016-04-11 DIAGNOSIS — R9431 Abnormal electrocardiogram [ECG] [EKG]: Secondary | ICD-10-CM | POA: Diagnosis not present

## 2016-04-11 NOTE — Patient Instructions (Addendum)
We will be checking the following labs today - NONE   Medication Instructions:    Continue with your current medicines.     Testing/Procedures To Be Arranged:  Stress Myoview  Echocardiogram  Follow-Up:   See me back after studies complete for discussion. I will then see you back annually and as needed.     Other Special Instructions:   N/A    If you need a refill on your cardiac medications before your next appointment, please call your pharmacy.   Call the [place] office at [phone] if you have any questions, problems or concerns.

## 2016-04-12 ENCOUNTER — Ambulatory Visit (HOSPITAL_COMMUNITY): Payer: Medicare Other | Attending: Cardiovascular Disease

## 2016-04-12 DIAGNOSIS — I10 Essential (primary) hypertension: Secondary | ICD-10-CM | POA: Diagnosis not present

## 2016-04-12 DIAGNOSIS — Z952 Presence of prosthetic heart valve: Secondary | ICD-10-CM | POA: Diagnosis not present

## 2016-04-12 DIAGNOSIS — R9431 Abnormal electrocardiogram [ECG] [EKG]: Secondary | ICD-10-CM | POA: Diagnosis not present

## 2016-04-12 DIAGNOSIS — I2510 Atherosclerotic heart disease of native coronary artery without angina pectoris: Secondary | ICD-10-CM | POA: Diagnosis not present

## 2016-04-12 HISTORY — PX: CARDIOVASCULAR STRESS TEST: SHX262

## 2016-04-12 LAB — MYOCARDIAL PERFUSION IMAGING
Estimated workload: 7 METS
Exercise duration (min): 5 min
Exercise duration (sec): 30 s
LV dias vol: 110 mL (ref 62–150)
LV sys vol: 44 mL
MPHR: 142 {beats}/min
Peak HR: 133 {beats}/min
Percent HR: 94 %
RATE: 0.29
RPE: 19
Rest HR: 54 {beats}/min
SDS: 0
SRS: 9
SSS: 9
TID: 0.92

## 2016-04-12 MED ORDER — TECHNETIUM TC 99M TETROFOSMIN IV KIT
33.0000 | PACK | Freq: Once | INTRAVENOUS | Status: AC | PRN
  Administered 2016-04-12: 33 via INTRAVENOUS
  Filled 2016-04-12: qty 33

## 2016-04-12 MED ORDER — TECHNETIUM TC 99M TETROFOSMIN IV KIT
10.4000 | PACK | Freq: Once | INTRAVENOUS | Status: AC | PRN
  Administered 2016-04-12: 10.4 via INTRAVENOUS
  Filled 2016-04-12: qty 11

## 2016-04-18 ENCOUNTER — Telehealth: Payer: Self-pay | Admitting: Nurse Practitioner

## 2016-04-18 NOTE — Telephone Encounter (Signed)
Records rec from [facility] @ [employer]. Placed in Chart Prep for 05/02/16 appointment with Tera Helper.

## 2016-04-27 ENCOUNTER — Ambulatory Visit (HOSPITAL_COMMUNITY): Payer: Medicare Other | Attending: Cardiology

## 2016-04-27 ENCOUNTER — Other Ambulatory Visit: Payer: Self-pay

## 2016-04-27 DIAGNOSIS — Z952 Presence of prosthetic heart valve: Secondary | ICD-10-CM

## 2016-04-27 DIAGNOSIS — I081 Rheumatic disorders of both mitral and tricuspid valves: Secondary | ICD-10-CM | POA: Diagnosis not present

## 2016-04-27 DIAGNOSIS — R9431 Abnormal electrocardiogram [ECG] [EKG]: Secondary | ICD-10-CM | POA: Insufficient documentation

## 2016-04-27 DIAGNOSIS — Z953 Presence of xenogenic heart valve: Secondary | ICD-10-CM | POA: Diagnosis not present

## 2016-04-27 DIAGNOSIS — I10 Essential (primary) hypertension: Secondary | ICD-10-CM | POA: Insufficient documentation

## 2016-05-02 ENCOUNTER — Ambulatory Visit (INDEPENDENT_AMBULATORY_CARE_PROVIDER_SITE_OTHER): Payer: Medicare Other | Admitting: Nurse Practitioner

## 2016-05-02 ENCOUNTER — Encounter: Payer: Self-pay | Admitting: Nurse Practitioner

## 2016-05-02 VITALS — BP 170/90 | HR 57 | Ht 72.0 in | Wt 189.0 lb

## 2016-05-02 DIAGNOSIS — R9431 Abnormal electrocardiogram [ECG] [EKG]: Secondary | ICD-10-CM

## 2016-05-02 DIAGNOSIS — Z952 Presence of prosthetic heart valve: Secondary | ICD-10-CM | POA: Diagnosis not present

## 2016-05-02 NOTE — Progress Notes (Signed)
CARDIOLOGY OFFICE NOTE  Date:  05/02/2016    [name] Date of Birth: 1935/08/02 Medical Record #[id number]  PCP:  Gerrit Heck, MD  Cardiologist:  Servando Snare    Chief Complaint  Patient presents with  . Cardiac Valve Problem    Follow up visit    History of Present Illness: [name] is a 81 y.o. male who presents today for a follow up visit. Previously seen by Dr. Marlou Porch. He has expressed a desire to follow with me.   He has a history of HTN, prior AVR for severe AI back in 2012 - minimally invasive by Dr. Roxy Manns - to be on SBE. Other issues are gout, prior instestinal obstruction, elevated PSA, neuropathy, borderline DM and orthostatic hypotension. Cardiac cath prior to his AVR showed minor CAD - 30 to 40% proximal LAD most notable with normal EF.    Had not been seen here in several years. Then referred back here by his PCP for dizziness in the setting of AVR. Dr. Drema Dallas had called here last month due to new EKG changes.    I then saw him - asymptomatic. EKG quite abnormal. Got an echo and Myoview on him. See below.   Comes in today. Here alone today. He is doing well. Remains active. No chest pain. Not short of breath. Not dizzy or lightheaded. Has had some right hip pain - tried some Tylenol as suggested by me - did not really help - has some old Ibuprofen 800 mg - took that last night - feels better. Would not prefer long term use of this. Reviewed his echo and Myoview with him today.   Past Medical History:  Diagnosis Date  . Aortic insufficiency   . Gout   . History of BPH   . History of small bowel obstruction   . HTN (hypertension)     Past Surgical History:  Procedure Laterality Date  . AORTIC VALVE REPLACEMENT  08/04/2010   #25 Magna Ease pericardial tissue valve via right mini thoracotomy - Dr. Roxy Manns  . CHOLECYSTECTOMY    . EXPLORATORY LAPAROTOMY IN 2007 FOR BOWEL OBSTRUCTION    . LEFT KNEE ARTHROSCOPY    . RIGHT FOOT SURGERY        Medications: Current Outpatient Prescriptions  Medication Sig Dispense Refill  . allopurinol (ZYLOPRIM) 300 MG tablet Take 300 mg by mouth daily.      Marland Kitchen amLODipine (NORVASC) 5 MG tablet Take 5 mg by mouth daily.     Marland Kitchen amoxicillin (AMOXIL) 500 MG capsule Take 2,000 mg by mouth as needed (1 hour prior before dental procedures).     Marland Kitchen amoxicillin (AMOXIL) 500 MG capsule Take 500 mg by mouth 2 (two) times daily.    Marland Kitchen aspirin 81 MG tablet Take 81 mg by mouth daily.     Marland Kitchen atenolol (TENORMIN) 50 MG tablet Take 50 mg by mouth daily.      . finasteride (PROSCAR) 5 MG tablet Take 5 mg by mouth daily.      Marland Kitchen pyridOXINE (VITAMIN B-6) 100 MG tablet Take 100 mg by mouth daily.      . tamsulosin (FLOMAX) 0.4 MG CAPS capsule      No current facility-administered medications for this visit.     Allergies: Allergies  Allergen Reactions  . Sulfa Antibiotics Other (See Comments)    GRANULOTOSIS    Social History: The patient  reports that he has never smoked. He has never used smokeless tobacco. He reports  that he drinks alcohol. He reports that he does not use drugs.   Family History: The patient's family history includes Brain cancer in his father; Heart disease in his mother.   Review of Systems: Please see the history of present illness.   Otherwise, the review of systems is positive for none.   All other systems are reviewed and negative.   Physical Exam: VS:  BP (!) 170/90 (BP Location: Left Arm, Patient Position: Sitting, Cuff Size: Normal)   Pulse (!) 57   Ht 6' (1.829 m)   Wt 189 lb (85.7 kg)   SpO2 98% Comment: at rest  BMI 25.63 kg/m  .  BMI Body mass index is 25.63 kg/m.  Wt Readings from Last 3 Encounters:  05/02/16 189 lb (85.7 kg)  04/12/16 198 lb (89.8 kg)  04/11/16 190 lb 6.4 oz (86.4 kg)   BP is 140/80 by me.   General: Pleasant. Well developed, well nourished and in no acute distress.   HEENT: Normal.  Neck: Supple, no JVD, carotid bruits, or masses noted.    Cardiac: Regular rate and rhythm. Harsh murmur noted. No edema.  Respiratory:  Lungs are clear to auscultation bilaterally with normal work of breathing.  GI: Soft and nontender.  MS: No deformity or atrophy. Gait and ROM intact.  Skin: Warm and dry. Color is normal.  Neuro:  Strength and sensation are intact and no gross focal deficits noted.  Psych: Alert, appropriate and with normal affect.   LABORATORY DATA:  EKG:  EKG is not ordered today.  Lab Results  Component Value Date   WBC 8.0 03/19/2011   HGB 14.2 03/19/2011   HCT 39.8 03/19/2011   PLT 172 03/19/2011   GLUCOSE 151 (H) 03/19/2011   ALT 19 08/01/2010   AST 21 08/01/2010   NA 139 03/19/2011   K 3.9 03/19/2011   CL 103 03/19/2011   CREATININE 0.95 03/19/2011   BUN 26 (H) 03/19/2011   CO2 24 03/19/2011   INR 1.08 08/08/2010   HGBA1C 5.7 (H) 08/01/2010    BNP (last 3 results) No results for input(s): BNP in the last 8760 hours.  ProBNP (last 3 results) No results for input(s): PROBNP in the last 8760 hours.   Other Studies Reviewed Today:  Echo Study Conclusions 04/2016  - Left ventricle: The cavity size was normal. Wall thickness was   increased in a pattern of severe LVH. Systolic function was   vigorous. The estimated ejection fraction was in the range of 65%   to 70%. Wall motion was normal; there were no regional wall   motion abnormalities. Doppler parameters are consistent with   abnormal left ventricular relaxation (grade 1 diastolic   dysfunction). - Aortic valve: A bioprosthesis was present. - Aortic root: The aortic root was mildly dilated. - Mitral valve: There was mild regurgitation. - Left atrium: The atrium was severely dilated. - Tricuspid valve: There was mild-moderate regurgitation. - Pulmonary arteries: PA peak pressure: 33 mm Hg (S).  Impressions:  - Vigorous LV systolic function; grade 1 diastolic dysfunction;   severe LVH; mildly dilated aortic root (4.2 cm); bioprosthetic    aortic valve with normal gradient and no AI; mild MR; severe LAE;   mild to moderate TR; density in RA (? RA catheter); consider TEE   if clinically indicated.   Myoview Study Highlights 04/2016    Nuclear stress EF: 60%.  Blood pressure demonstrated a normal response to exercise.  Horizontal ST segment depression ST segment depression  of 2 mm was noted during stress in the II, III, aVF, V6, V5 and V4 leads.  This is a low risk study.  ST segment depression of 2 mm was noted during stress in the II, III, aVF, V6, V5 and V4 leads.  The test was stopped because the patient complained of fatigue and shortness of breath  Overall, the patient's exercise capacity was moderately impaired  [facility] Treadmill Score: intermediate risk   No significant reversible ischemia. LVEF 60% with normal wall motion. 2 mm inferior and lateral scooped ST-segment depression with exercise, may be repolarization abnormality. Exercise capacity was moderately reduced. Overall, however, this is a low risk study.    Assessment/Plan:  1. Abnormal EKG - has new diffuse T wave changes - his Myoview is noted to be low risk - did have EKG changes - possibly related to repolarization. His exercise capacity was moderately reduced. Echo without wall motion abnormality. He is doing well clinically - will continue to follow clinically.   2. Prior AVR for severe AI - echo updated - ?RA density - Dr. Stanford Breed has discussed with me - felt to have 2 options - could go ahead and proceed with TEE or have a limited echo in a few months to recheck. This has been discussed in detail - he wishes to have repeat limited echo in 3 months. No current cardiac symptoms noted. Will plan on getting Dr. Stanford Breed to review with me in 3 months.   3. Nonobstructive CAD per cath from 2012 - would manage with CV risk factor modification - his Myoview does not show ischemia.    4. HTN - recheck by me is ok. Has just had his medicines.   5.  HLD - I do not see recent lipids. Last lipids I see goes back to 2016. He typically has with his PCP.   6. Hip pain - suggested limited use of the NSAID - may need ortho referral if fails to improve - will defer to PCP.   Current medicines are reviewed with the patient today.  The patient does not have concerns regarding medicines other than what has been noted above.  The following changes have been made:  See above.  Labs/ tests ordered today include:    Orders Placed This Encounter  Procedures  . ECHOCARDIOGRAM LIMITED     Disposition:   FU with me in 6 months.    Patient is agreeable to this plan and will call if any problems develop in the interim.   SignedTruitt Merle, NP  05/02/2016 9:16 AM  [facility] [address] [place], [place]  [address] Phone: [phone] Fax: [phone]

## 2016-05-02 NOTE — Patient Instructions (Addendum)
We will be checking the following labs today - NONE   Medication Instructions:    Continue with your current medicines.     Testing/Procedures To Be Arranged:  Limited echo in 3 months   Follow-Up:   See me in 6 months    Other Special Instructions:   N/A    If you need a refill on your cardiac medications before your next appointment, please call your pharmacy.   Call the [place] office at [phone] if you have any questions, problems or concerns.

## 2016-05-17 ENCOUNTER — Other Ambulatory Visit: Payer: Self-pay | Admitting: Urology

## 2016-05-24 ENCOUNTER — Encounter (HOSPITAL_BASED_OUTPATIENT_CLINIC_OR_DEPARTMENT_OTHER): Payer: Self-pay | Admitting: *Deleted

## 2016-05-25 ENCOUNTER — Encounter (HOSPITAL_BASED_OUTPATIENT_CLINIC_OR_DEPARTMENT_OTHER): Payer: Self-pay | Admitting: *Deleted

## 2016-05-25 ENCOUNTER — Telehealth: Payer: Self-pay | Admitting: Cardiology

## 2016-05-25 NOTE — Telephone Encounter (Signed)
Who is wanting the TEE - anesthesia or the patient???

## 2016-05-25 NOTE — Telephone Encounter (Signed)
I have not seen this pt; please forward to Truitt Merle or Dr Foye Clock

## 2016-05-25 NOTE — Telephone Encounter (Signed)
Spoke with pre-op person, anesthesia is asking for clearance for patient to have surgery for bladder stones. Per the last office note they would like the TEE done prior to surgery. Will forward for dr Stanford Breed review

## 2016-05-25 NOTE — Telephone Encounter (Signed)
Please call today if possible,this is in regards to the surgery pt is going to have on 06-05-16.

## 2016-05-25 NOTE — Progress Notes (Signed)
To [facility] at 0730-Istat on arrival-Npo after Mn-will take amlodipine,atenolol with small amt water in am.Ekg,echo,office note with chart-reviewed with Dr Laurene Footman clearance from cardiologist since surgery not scheduled prior to the  office visit and follow up TEE discussed,along with Ekg changes.[facility] office called- L.Gerhardt NP off today-message left for nurse to call me back today.

## 2016-05-28 ENCOUNTER — Telehealth (HOSPITAL_COMMUNITY): Payer: Self-pay | Admitting: Cardiology

## 2016-05-28 NOTE — Telephone Encounter (Signed)
Go ahead and repeat transthoracic echocardiogram. There was a questionable mass in the right atrium. Candee Furbish, MD

## 2016-05-28 NOTE — Telephone Encounter (Signed)
Order had already been placed and pt had been scheduled for July.  Sent staff message to [place] requesting it be moved up for surgeries clearance.

## 2016-05-28 NOTE — Telephone Encounter (Signed)
Error in last phone message pt see's Dr. Risa Grill.

## 2016-05-28 NOTE — Telephone Encounter (Signed)
I called pt to get him scheduled for an echocardiogram per Dr. Marlou Porch and his wife said that he would not be home until after 5 pm and she would have him to call back in the morning.

## 2016-05-28 NOTE — Telephone Encounter (Signed)
Left message for pt to call back to reschedule his echo.

## 2016-05-28 NOTE — Telephone Encounter (Signed)
S/w [name] had no idea what I was talking about on prior phone note. Looks like [name] is stating [name]'s office note stated [name] might have to have a TEE.  [name] stated [name] stated "if a bloop was seen on next echo,"  [name] stated [name] said [name] would have to get a TEE but as far as [name] stated nothing was stated to him [name] sees [name] at [facility] @ [phone]. Will send to [name] to advise.

## 2016-05-29 NOTE — Telephone Encounter (Signed)
I called back and lmsg giving him an appt date and time of 2pm 5/23 for his echo at [facility].

## 2016-05-30 ENCOUNTER — Ambulatory Visit (HOSPITAL_COMMUNITY)
Admission: RE | Admit: 2016-05-30 | Discharge: 2016-05-30 | Disposition: A | Discharge status: Home / Self Care | Payer: Medicare Other | Source: Ambulatory Visit | Attending: Family Medicine | Admitting: Family Medicine

## 2016-05-30 DIAGNOSIS — Z952 Presence of prosthetic heart valve: Secondary | ICD-10-CM | POA: Diagnosis not present

## 2016-05-30 DIAGNOSIS — R9431 Abnormal electrocardiogram [ECG] [EKG]: Secondary | ICD-10-CM | POA: Diagnosis not present

## 2016-05-30 DIAGNOSIS — I081 Rheumatic disorders of both mitral and tricuspid valves: Secondary | ICD-10-CM | POA: Insufficient documentation

## 2016-05-30 NOTE — Telephone Encounter (Signed)
Pt has been scheduled for 5/23 for echo

## 2016-06-01 ENCOUNTER — Telehealth: Payer: Self-pay | Admitting: Nurse Practitioner

## 2016-06-01 ENCOUNTER — Encounter (HOSPITAL_BASED_OUTPATIENT_CLINIC_OR_DEPARTMENT_OTHER): Payer: Self-pay | Admitting: *Deleted

## 2016-06-01 NOTE — Telephone Encounter (Signed)
°  Follow Up   Pt is returning call following up on echocardiogram results. Please call.

## 2016-06-01 NOTE — Progress Notes (Addendum)
FOLLOWING UP ON PT PER SHARON RN NOTE THEN SEEING THE CARDIOLOGIST DR Marlou Porch DID ORDER ECHO. THIS WAS DONE [phone].  FINAL RESULT IN EPIC AND STATED BY DR DR Marlou Porch OK TO PROCEED WITH  UROLOGIC PROCEDURE (REFER TO ECHO). UPDATED HISTORY IN EPIC .

## 2016-06-04 NOTE — H&P (Signed)
CC: BPH  HPI: [name] is a 81 year-old male established patient who is here for follow up regarding further evaluation of BPH and lower urinary tract symptoms.  Long-standing voiding complaints. Prior history of elevated PSA, which subsequently normalized. Postvoid residual last year was 50 mL. Voidings score in 2017 was 5/2.   His symptoms have been stable over the last year. The patient complains of lower urinary tract symptom(s) that include hesitancy and sense of incomplete emptying. The patient states his most bothersome symptom(s) are the following: hesitancy. Patient is currently treated with finasteride and tamsulosin for his symptoms. The patient states if he were to spend the rest of his life with his current urinary condition, he would be mostly satisfied. He denies any other associated symptoms.     CC: I have bladder stones.  HPI: Layering bladder calculi noted on recent CT imaging as likely cause of the noted hematuria on UA at last OV. Pt without increase in LUTS. Afebrile. Denies pain.   His problem was diagnosed 05/11/2016. His symptoms include blood in urine. Patient denies having flank pain, back pain, groin pain, nausea, vomiting, fever, and chills.   He has had prior urinary tract or prostate infections.   He does not have pain or burning when he urinates.   He has had no prostate surgery.     AUA Symptom Score: Less than 20% of the time he has the sensation of not emptying his bladder completely when finished urinating. He never has to urinate again less that two hours after he has finished urinating. He does not have to stop and start again several times when he urinates. He never finds it difficult to postpone urination. He never has a weak urinary stream. He never has to push or strain to begin urination. He has to get up to urinate 1 time from the time he goes to bed until the time he gets up in the morning.   Calculated AUA Symptom Score: 2    ALLERGIES:  Sulfa Drugs    MEDICATIONS: Allopurinol 300 MG Oral Tablet Oral  Amlodipine Besylate 5 mg tablet Oral  Aspirin 81 MG TABS Oral  Atenolol 50 mg tablet Oral  Digestive Health Probiotic CAPS Oral  Finasteride 5 mg tablet 0 Oral  Phillips Colon Health CAPS Oral  Tamsulosin HCl - 0.4 MG Oral Capsule 0 Oral  Vitamin B Complex capsule Oral     GU PSH: Locm 300-399Mg /Ml Iodine,1Ml - 05/11/2016      PSH Notes: Heart Valve Replacement, Cholecystectomy   NON-GU PSH: Cholecystectomy (open) - 2007    GU PMH: BPH w/LUTS - 04/25/2016, Benign prostatic hyperplasia with urinary obstruction, - 04/18/2015 Incomplete bladder emptying - 04/25/2016 Microscopic hematuria - 04/25/2016 Other difficulties with micturition, Difficulty voiding - 05/05/2015 Urinary Tract Inf, Unspec site, Bacteriuria with pyuria - 05/05/2015 Weak Urinary Stream, Weak urinary stream - 05/05/2015 Urinary Retention, Unspec, Incomplete bladder emptying - 2016 Chronic prostatitis, Prostatitis, chronic - 2015 Bladder Stone, Bladder calculus - 2015 ED due to arterial insufficiency, Erectile dysfunction due to arterial insufficiency - 2015 Elevated PSA, Elevated prostate specific antigen (PSA) - 2014    NON-GU PMH: Encounter for general adult medical examination without abnormal findings, Encounter for preventive health examination - 2015 Gout, Gout - 2014 Personal history of other diseases of the circulatory system, History of hypertension - 2014    FAMILY HISTORY: Family Health Status Number - Runs In Family   SOCIAL HISTORY: Marital Status: Married Current Smoking Status: Patient has  never smoked.   Tobacco Use Assessment Completed: Used Tobacco in last 30 days? Drinks 1 drink per week.  Does not drink caffeine.     Notes: Never A Smoker, Alcohol Use, Caffeine Use, Occupation:, Tobacco Use, Marital History - Currently Married   REVIEW OF SYSTEMS:    GU Review Male:   Patient reports trouble starting your stream. Patient  denies frequent urination, hard to postpone urination, burning/ pain with urination, get up at night to urinate, leakage of urine, stream starts and stops, have to strain to urinate , erection problems, and penile pain.  Gastrointestinal (Upper):   Patient denies nausea, vomiting, and indigestion/ heartburn.  Gastrointestinal (Lower):   Patient denies diarrhea and constipation.  Constitutional:   Patient denies fever, night sweats, weight loss, and fatigue.  Skin:   Patient denies skin rash/ lesion and itching.  Eyes:   Patient denies blurred vision and double vision.  Ears/ Nose/ Throat:   Patient denies sore throat and sinus problems.  Hematologic/Lymphatic:   Patient denies swollen glands and easy bruising.  Cardiovascular:   Patient denies leg swelling and chest pains.  Respiratory:   Patient denies cough and shortness of breath.  Endocrine:   Patient denies excessive thirst.  Musculoskeletal:   Patient denies back pain and joint pain.  Neurological:   Patient denies headaches and dizziness.  Psychologic:   Patient denies depression and anxiety.   VITAL SIGNS:      05/15/2016 08:27 AM  BP 156/89 mmHg  Pulse 58 /min   MULTI-SYSTEM PHYSICAL EXAMINATION:    Constitutional: Well-nourished. No physical deformities. Normally developed. Good grooming.  Respiratory: No labored breathing, no use of accessory muscles. CTA.  Cardiovascular: Normal temperature, normal extremity pulses, no swelling, no varicosities. RRR.  Lymphatic: No enlargement of neck, axillae, groin.  Skin: No paleness, no jaundice, no cyanosis. No lesion, no ulcer, no rash.  Neurologic / Psychiatric: Oriented to time, oriented to place, oriented to person. No depression, no anxiety, no agitation.  Gastrointestinal: No mass, no tenderness, no rigidity, non obese abdomen. No flank or suprapubic tenderness.  Musculoskeletal: Spine, ribs, pelvis no bilateral tenderness. Normal gait and station of head and neck.     PAST DATA  REVIEWED:  Source Of History:  Patient  Records Review:   Previous Patient Records  Urine Test Review:   Urinalysis, Urine Culture  Urodynamics Review:   Review Bladder Scan  X-Ray Review: C.T. Hematuria: Reviewed Films.     12/28/10 12/24/09 12/16/08 01/16/08 07/14/07 12/09/06 12/27/05 06/28/05  PSA  Total PSA 2.94  2.79  2.55  2.93  2.64  1.83  3.26  2.55   Free PSA        1.03   % Free PSA        40.4     PROCEDURES:           PVR Ultrasound - 51798  Scanned Volume: 376 cc         Urinalysis w/Scope - 81001 Dipstick Dipstick Cont'd Micro  Specimen: Voided Bilirubin: Neg WBC/hpf: 0 - 5/hpf  Color: Yellow Ketones: Neg RBC/hpf: 10 - 20/hpf  Appearance: Cloudy Blood: 3+ Bacteria: Many (>50/hpf)  Specific Gravity: 1.020 Protein: Neg Cystals: NS (Not Seen)  pH: 6.5 Urobilinogen: 0.2 Casts: NS (Not Seen)  Glucose: Neg Nitrites: Positive Trichomonas: Not Present    Leukocyte Esterase: Neg Mucous: Not Present      Epithelial Cells: NS (Not Seen)      Yeast: NS (Not Seen)  Sperm: Not Present         Urinalysis w/Scope - 81001 Dipstick Dipstick Cont'd Micro  Color: Yellow Bilirubin: Neg WBC/hpf: 0 - 5/hpf  Appearance: Cloudy Ketones: Neg RBC/hpf: 10 - 20/hpf  Specific Gravity: 1.020 Blood: 3+ Bacteria: Many (>50/hpf)  pH: 6.5 Protein: Neg Cystals: NS (Not Seen)  Glucose: Neg Urobilinogen: 0.2 Casts: NS (Not Seen)    Nitrites: Positive Trichomonas: Not Present    Leukocyte Esterase: Neg Mucous: Not Present      Epithelial Cells: NS (Not Seen)      Yeast: NS (Not Seen)      Sperm: Not Present    ASSESSMENT:      ICD-10 Details  1 GU:   BPH w/LUTS - N40.1   2   Bladder Stone - N21.0   3   Incomplete bladder emptying - R39.14 Chronic   PLAN:            Medications Stop Meds: Amoxicillin 500 mg capsule 0 Oral  Start: 04/18/2015  Discontinue: 05/15/2016  - Reason: The medication cycle was completed.            Orders Labs Urine Culture           Document Letter(s):  Created for Patient: Clinical Summary         Notes:   I went over the risks and benefits of cystolitholapaxy as well as Dr. Cy Blamer recommendation to continue observing the known right-sided stone burden. All questions answered, understanding expressed. I will turn in a surgical posting sheet today. Patient understands that it is his urologist's recommendation to have another one of the providers perform the procedure and that the stones in the bladder are likely the cause of his noted painless hematuria.

## 2016-06-05 ENCOUNTER — Ambulatory Visit (HOSPITAL_BASED_OUTPATIENT_CLINIC_OR_DEPARTMENT_OTHER)
Admission: RE | Admit: 2016-06-05 | Discharge: 2016-06-05 | Disposition: A | Discharge status: Home / Self Care | Payer: Medicare Other | Source: Ambulatory Visit | Attending: Urology | Admitting: Urology

## 2016-06-05 ENCOUNTER — Encounter (HOSPITAL_BASED_OUTPATIENT_CLINIC_OR_DEPARTMENT_OTHER)
Admission: RE | Disposition: A | Discharge status: Home / Self Care | Payer: Self-pay | Source: Ambulatory Visit | Attending: Urology

## 2016-06-05 ENCOUNTER — Encounter (HOSPITAL_BASED_OUTPATIENT_CLINIC_OR_DEPARTMENT_OTHER): Payer: Self-pay | Admitting: Anesthesiology

## 2016-06-05 ENCOUNTER — Ambulatory Visit (HOSPITAL_BASED_OUTPATIENT_CLINIC_OR_DEPARTMENT_OTHER): Payer: Medicare Other | Admitting: Anesthesiology

## 2016-06-05 DIAGNOSIS — R011 Cardiac murmur, unspecified: Secondary | ICD-10-CM | POA: Diagnosis not present

## 2016-06-05 DIAGNOSIS — Z952 Presence of prosthetic heart valve: Secondary | ICD-10-CM | POA: Diagnosis not present

## 2016-06-05 DIAGNOSIS — Y838 Other surgical procedures as the cause of abnormal reaction of the patient, or of later complication, without mention of misadventure at the time of the procedure: Secondary | ICD-10-CM | POA: Insufficient documentation

## 2016-06-05 DIAGNOSIS — Z882 Allergy status to sulfonamides status: Secondary | ICD-10-CM | POA: Diagnosis not present

## 2016-06-05 DIAGNOSIS — M109 Gout, unspecified: Secondary | ICD-10-CM | POA: Insufficient documentation

## 2016-06-05 DIAGNOSIS — I10 Essential (primary) hypertension: Secondary | ICD-10-CM | POA: Diagnosis not present

## 2016-06-05 DIAGNOSIS — Y733 Surgical instruments, materials and gastroenterology and urology devices (including sutures) associated with adverse incidents: Secondary | ICD-10-CM | POA: Insufficient documentation

## 2016-06-05 DIAGNOSIS — R3914 Feeling of incomplete bladder emptying: Secondary | ICD-10-CM | POA: Insufficient documentation

## 2016-06-05 DIAGNOSIS — Z79899 Other long term (current) drug therapy: Secondary | ICD-10-CM | POA: Diagnosis not present

## 2016-06-05 DIAGNOSIS — I2510 Atherosclerotic heart disease of native coronary artery without angina pectoris: Secondary | ICD-10-CM | POA: Insufficient documentation

## 2016-06-05 DIAGNOSIS — N3289 Other specified disorders of bladder: Secondary | ICD-10-CM | POA: Diagnosis not present

## 2016-06-05 DIAGNOSIS — N401 Enlarged prostate with lower urinary tract symptoms: Secondary | ICD-10-CM | POA: Insufficient documentation

## 2016-06-05 DIAGNOSIS — N210 Calculus in bladder: Secondary | ICD-10-CM | POA: Insufficient documentation

## 2016-06-05 DIAGNOSIS — N9961 Intraoperative hemorrhage and hematoma of a genitourinary system organ or structure complicating a genitourinary system procedure: Secondary | ICD-10-CM | POA: Diagnosis not present

## 2016-06-05 DIAGNOSIS — R310 Gross hematuria: Secondary | ICD-10-CM | POA: Diagnosis not present

## 2016-06-05 DIAGNOSIS — N139 Obstructive and reflux uropathy, unspecified: Secondary | ICD-10-CM | POA: Insufficient documentation

## 2016-06-05 DIAGNOSIS — Z8679 Personal history of other diseases of the circulatory system: Secondary | ICD-10-CM | POA: Diagnosis not present

## 2016-06-05 DIAGNOSIS — Z7982 Long term (current) use of aspirin: Secondary | ICD-10-CM | POA: Insufficient documentation

## 2016-06-05 HISTORY — DX: Personal history of other specified conditions: Z87.898

## 2016-06-05 HISTORY — DX: Diverticulosis of large intestine without perforation or abscess without bleeding: K57.30

## 2016-06-05 HISTORY — DX: Benign prostatic hyperplasia without lower urinary tract symptoms: N40.0

## 2016-06-05 HISTORY — DX: Personal history of (corrected) congenital malformations of heart and circulatory system: Z87.74

## 2016-06-05 HISTORY — PX: CYSTOSCOPY WITH LITHOLAPAXY: SHX1425

## 2016-06-05 HISTORY — DX: Atherosclerotic heart disease of native coronary artery without angina pectoris: I25.10

## 2016-06-05 HISTORY — DX: Calculus in bladder: N21.0

## 2016-06-05 HISTORY — DX: Polyneuropathy, unspecified: G62.9

## 2016-06-05 HISTORY — DX: Personal history of other diseases of the circulatory system: Z86.79

## 2016-06-05 HISTORY — DX: Prediabetes: R73.03

## 2016-06-05 HISTORY — DX: Personal history of colonic polyps: Z86.010

## 2016-06-05 HISTORY — DX: Presence of prosthetic heart valve: Z95.2

## 2016-06-05 HISTORY — DX: Personal history of adenomatous and serrated colon polyps: Z86.0101

## 2016-06-05 LAB — POCT I-STAT 4, (NA,K, GLUC, HGB,HCT)
Glucose, Bld: 101 mg/dL — ABNORMAL HIGH (ref 65–99)
HCT: 41 % (ref 39.0–52.0)
Hemoglobin: 13.9 g/dL (ref 13.0–17.0)
Potassium: 4.1 mmol/L (ref 3.5–5.1)
Sodium: 141 mmol/L (ref 135–145)

## 2016-06-05 SURGERY — CYSTOSCOPY, WITH BLADDER CALCULUS LITHOLAPAXY
Anesthesia: General | Site: Bladder

## 2016-06-05 MED ORDER — FENTANYL CITRATE (PF) 100 MCG/2ML IJ SOLN
25.0000 ug | INTRAMUSCULAR | Status: DC | PRN
  Filled 2016-06-05: qty 1

## 2016-06-05 MED ORDER — EPHEDRINE SULFATE 50 MG/ML IJ SOLN
INTRAMUSCULAR | Status: DC | PRN
  Administered 2016-06-05 (×2): 15 mg via INTRAVENOUS

## 2016-06-05 MED ORDER — PROPOFOL 10 MG/ML IV BOLUS
INTRAVENOUS | Status: AC
  Filled 2016-06-05: qty 20

## 2016-06-05 MED ORDER — DEXAMETHASONE SODIUM PHOSPHATE 10 MG/ML IJ SOLN
INTRAMUSCULAR | Status: AC
  Filled 2016-06-05: qty 1

## 2016-06-05 MED ORDER — LIDOCAINE 2% (20 MG/ML) 5 ML SYRINGE
INTRAMUSCULAR | Status: DC | PRN
  Administered 2016-06-05: 60 mg via INTRAVENOUS

## 2016-06-05 MED ORDER — EPHEDRINE 5 MG/ML INJ
INTRAVENOUS | Status: AC
  Filled 2016-06-05: qty 20

## 2016-06-05 MED ORDER — LACTATED RINGERS IV SOLN
INTRAVENOUS | Status: DC
  Administered 2016-06-05: 08:00:00 via INTRAVENOUS
  Filled 2016-06-05: qty 1000

## 2016-06-05 MED ORDER — OXYCODONE HCL 5 MG/5ML PO SOLN
5.0000 mg | Freq: Once | ORAL | Status: DC | PRN
  Filled 2016-06-05: qty 5

## 2016-06-05 MED ORDER — ACETAMINOPHEN 650 MG RE SUPP
650.0000 mg | RECTAL | Status: DC | PRN
  Filled 2016-06-05: qty 1

## 2016-06-05 MED ORDER — SODIUM CHLORIDE 0.9% FLUSH
3.0000 mL | INTRAVENOUS | Status: DC | PRN
  Filled 2016-06-05: qty 3

## 2016-06-05 MED ORDER — MORPHINE SULFATE (PF) 2 MG/ML IV SOLN
2.0000 mg | INTRAVENOUS | Status: DC | PRN
  Filled 2016-06-05: qty 1

## 2016-06-05 MED ORDER — GLYCOPYRROLATE 0.2 MG/ML IJ SOLN
INTRAMUSCULAR | Status: DC | PRN
  Administered 2016-06-05: 0.2 mg via INTRAVENOUS

## 2016-06-05 MED ORDER — MIDAZOLAM HCL 2 MG/2ML IJ SOLN
INTRAMUSCULAR | Status: AC
  Filled 2016-06-05: qty 2

## 2016-06-05 MED ORDER — DEXAMETHASONE SODIUM PHOSPHATE 10 MG/ML IJ SOLN
INTRAMUSCULAR | Status: DC | PRN
  Administered 2016-06-05: 10 mg via INTRAVENOUS

## 2016-06-05 MED ORDER — OXYCODONE HCL 5 MG PO TABS
5.0000 mg | ORAL_TABLET | Freq: Once | ORAL | Status: DC | PRN
  Filled 2016-06-05: qty 1

## 2016-06-05 MED ORDER — FENTANYL CITRATE (PF) 100 MCG/2ML IJ SOLN
INTRAMUSCULAR | Status: AC
  Filled 2016-06-05: qty 2

## 2016-06-05 MED ORDER — OXYCODONE HCL 5 MG PO TABS
5.0000 mg | ORAL_TABLET | ORAL | Status: DC | PRN
  Filled 2016-06-05: qty 2

## 2016-06-05 MED ORDER — GLYCOPYRROLATE 0.2 MG/ML IV SOSY
PREFILLED_SYRINGE | INTRAVENOUS | Status: AC
  Filled 2016-06-05: qty 5

## 2016-06-05 MED ORDER — ACETAMINOPHEN 325 MG PO TABS
650.0000 mg | ORAL_TABLET | ORAL | Status: DC | PRN
  Filled 2016-06-05: qty 2

## 2016-06-05 MED ORDER — TRAMADOL HCL 50 MG PO TABS: 50.0000 mg | ORAL_TABLET | Freq: Four times a day (QID) | ORAL | 0 refills | Status: DC | PRN

## 2016-06-05 MED ORDER — CEFAZOLIN SODIUM-DEXTROSE 2-4 GM/100ML-% IV SOLN
INTRAVENOUS | Status: AC
  Filled 2016-06-05: qty 100

## 2016-06-05 MED ORDER — SODIUM CHLORIDE 0.9 % IV SOLN
250.0000 mL | INTRAVENOUS | Status: DC | PRN
  Filled 2016-06-05: qty 250

## 2016-06-05 MED ORDER — SODIUM CHLORIDE 0.9% FLUSH
3.0000 mL | Freq: Two times a day (BID) | INTRAVENOUS | Status: DC
  Filled 2016-06-05: qty 3

## 2016-06-05 MED ORDER — CEFAZOLIN SODIUM-DEXTROSE 2-4 GM/100ML-% IV SOLN
2.0000 g | INTRAVENOUS | Status: AC
  Administered 2016-06-05: 2 g via INTRAVENOUS
  Filled 2016-06-05: qty 100

## 2016-06-05 MED ORDER — ONDANSETRON HCL 4 MG/2ML IJ SOLN
4.0000 mg | Freq: Four times a day (QID) | INTRAMUSCULAR | Status: DC | PRN
  Filled 2016-06-05: qty 2

## 2016-06-05 MED ORDER — PROPOFOL 500 MG/50ML IV EMUL
INTRAVENOUS | Status: DC | PRN
  Administered 2016-06-05: 200 mL via INTRAVENOUS

## 2016-06-05 MED ORDER — ONDANSETRON HCL 4 MG/2ML IJ SOLN
INTRAMUSCULAR | Status: AC
  Filled 2016-06-05: qty 2

## 2016-06-05 MED ORDER — FENTANYL CITRATE (PF) 100 MCG/2ML IJ SOLN
INTRAMUSCULAR | Status: DC | PRN
  Administered 2016-06-05: 50 ug via INTRAVENOUS

## 2016-06-05 SURGICAL SUPPLY — 36 items
BAG DRAIN URO-CYSTO SKYTR STRL (DRAIN) ×2 IMPLANT
BAG URINE LEG 500ML (DRAIN) ×2 IMPLANT
BASKET LASER NITINOL 1.9FR (BASKET) IMPLANT
BASKET STNLS GEMINI 4WIRE 3FR (BASKET) IMPLANT
BASKET ZERO TIP NITINOL 2.4FR (BASKET) IMPLANT
CATH FOLEY 2WAY SLVR  5CC 20FR (CATHETERS) ×1
CATH FOLEY 2WAY SLVR 5CC 20FR (CATHETERS) ×1 IMPLANT
CATH INTERMIT  6FR 70CM (CATHETERS) IMPLANT
CATH URET 5FR 28IN CONE TIP (BALLOONS)
CATH URET 5FR 28IN OPEN ENDED (CATHETERS) IMPLANT
CATH URET 5FR 70CM CONE TIP (BALLOONS) IMPLANT
CLOTH BEACON ORANGE TIMEOUT ST (SAFETY) ×4 IMPLANT
ELECTROHYDROLIC PROBE 9FR (MISCELLANEOUS) IMPLANT
FIBER LASER FLEXIVA 1000 (UROLOGICAL SUPPLIES) IMPLANT
FIBER LASER FLEXIVA 365 (UROLOGICAL SUPPLIES) IMPLANT
FIBER LASER FLEXIVA 550 (UROLOGICAL SUPPLIES) IMPLANT
FIBER LASER TRAC TIP (UROLOGICAL SUPPLIES) IMPLANT
GLOVE SURG SS PI 8.0 STRL IVOR (GLOVE) ×2 IMPLANT
GOWN STRL REUS W/ TWL XL LVL3 (GOWN DISPOSABLE) ×1 IMPLANT
GOWN STRL REUS W/TWL XL LVL3 (GOWN DISPOSABLE) ×1
GOWN XL W/COTTON TOWEL STD (GOWNS) ×2 IMPLANT
GUIDEWIRE 0.038 PTFE COATED (WIRE) IMPLANT
GUIDEWIRE ANG ZIPWIRE 038X150 (WIRE) IMPLANT
GUIDEWIRE STR DUAL SENSOR (WIRE) IMPLANT
KIT BALLIN UROMAX 15FX10 (LABEL) IMPLANT
KIT BALLN UROMAX 15FX4 (MISCELLANEOUS) IMPLANT
KIT BALLN UROMAX 26 75X4 (MISCELLANEOUS)
KIT RM TURNOVER CYSTO AR (KITS) ×2 IMPLANT
LOOP CUT BIPOLAR 24F LRG (ELECTROSURGICAL) ×2 IMPLANT
MANIFOLD NEPTUNE II (INSTRUMENTS) IMPLANT
PACK CYSTO (CUSTOM PROCEDURE TRAY) ×2 IMPLANT
PROBE LITHO 3.3FR 2137.235 (UROLOGICAL SUPPLIES) IMPLANT
PROBE LITHO 5.0FR 2137.1505 (MISCELLANEOUS) IMPLANT
SET HIGH PRES BAL DIL (LABEL)
TUBE CONNECTING 12X1/4 (SUCTIONS) IMPLANT
WATER STERILE IRR 500ML POUR (IV SOLUTION) ×2 IMPLANT

## 2016-06-05 NOTE — Anesthesia Postprocedure Evaluation (Signed)
Anesthesia Post Note  Patient: [name]  Procedure(s) Performed: Procedure(s) (LRB): CYSTOSCOPY WITH LITHOLAPAXY and fulgarization of bladder neck (N/A)  Patient location during evaluation: PACU Anesthesia Type: General Level of consciousness: awake and alert and patient cooperative Pain management: pain level controlled Vital Signs Assessment: post-procedure vital signs reviewed and stable Respiratory status: spontaneous breathing and respiratory function stable Cardiovascular status: stable Anesthetic complications: no       Last Vitals:  Vitals:   06/05/16 1030 06/05/16 1115  BP: (!) 155/80 (!) 158/79  Pulse: (!) 58 (!) 55  Resp: (!) 8 12  Temp:  36.6 C    Last Pain:  Vitals:   06/05/16 0738  TempSrc: Oral                 [name] S

## 2016-06-05 NOTE — Interval H&P Note (Signed)
History and Physical Interval Note:  06/05/2016 8:28 AM  [name]  has presented today for surgery, with the diagnosis of BLADDER STONES, GROSS HEMATURIA  The various methods of treatment have been discussed with the patient and family. After consideration of risks, benefits and other options for treatment, the patient has consented to  Procedure(s): CYSTOSCOPY WITH LITHOLAPAXY (N/A) as a surgical intervention .  The patient's history has been reviewed, patient examined, no change in status, stable for surgery.  I have reviewed the patient's chart and labs.  Questions were answered to the patient's satisfaction.     [name] J

## 2016-06-05 NOTE — Anesthesia Preprocedure Evaluation (Addendum)
Anesthesia Evaluation  Patient identified by MRN, date of birth, ID band Patient awake    Reviewed: Allergy & Precautions, H&P , NPO status , Patient's Chart, lab work & pertinent test results  Airway Mallampati: II  TM Distance: >3 FB Neck ROM: full    Dental  (+) Teeth Intact, Dental Advisory Given, Caps,    Pulmonary neg pulmonary ROS,    breath sounds clear to auscultation       Cardiovascular hypertension, Pt. on medications and Pt. on home beta blockers + CAD and + DOE  + Valvular Problems/Murmurs  Rhythm:regular Rate:Normal  05-30-16 2 D Echo The estimated ejection fraction was in the range of 60% to 65%. Wall motion was normal------------------------------------------------------------------There was no stenosis.   There was no regurgitation.; Aortic valve:  A bioprosthesis was present.    Neuro/Psych    GI/Hepatic   Endo/Other    Renal/GU      Musculoskeletal   Abdominal   Peds  Hematology   Anesthesia Other Findings   Reproductive/Obstetrics                         Anesthesia Physical Anesthesia Plan  ASA: III  Anesthesia Plan: General   Post-op Pain Management:    Induction: Intravenous  Airway Management Planned: LMA  Additional Equipment:   Intra-op Plan:   Post-operative Plan:   Informed Consent: I have reviewed the patients History and Physical, chart, labs and discussed the procedure including the risks, benefits and alternatives for the proposed anesthesia with the patient or authorized representative who has indicated his/her understanding and acceptance.     Plan Discussed with: CRNA, Anesthesiologist and Surgeon  Anesthesia Plan Comments:         Anesthesia Quick Evaluation

## 2016-06-05 NOTE — Anesthesia Procedure Notes (Signed)
Procedure Name: LMA Insertion Date/Time: 06/05/2016 8:49 AM Performed by: Wanita Chamberlain Pre-anesthesia Checklist: Patient identified, Emergency Drugs available, Suction available, Patient being monitored and Timeout performed Patient Re-evaluated:Patient Re-evaluated prior to inductionOxygen Delivery Method: Circle system utilized Preoxygenation: Pre-oxygenation with 100% oxygen Intubation Type: IV induction Ventilation: Mask ventilation without difficulty LMA: LMA inserted LMA Size: 4.0 Number of attempts: 1 Placement Confirmation: breath sounds checked- equal and bilateral and positive ETCO2 Tube secured with: Tape Dental Injury: Teeth and Oropharynx as per pre-operative assessment

## 2016-06-05 NOTE — Discharge Instructions (Addendum)
CYSTOSCOPY HOME CARE INSTRUCTIONS  Activity: Rest for the remainder of the day.  Do not drive or operate equipment today.  You may resume normal activities in one to two days as instructed by your physician.   Meals: Drink plenty of liquids and eat light foods such as gelatin or soup this evening.  You may return to a normal meal plan tomorrow.  Return to Work: You may return to work in one to two days or as instructed by your physician.  Special Instructions / Symptoms: Call your physician if any of these symptoms occur:   -persistent or heavy bleeding  -bleeding which continues after first few urination  -large blood clots that are difficult to pass  -urine stream diminishes or stops completely  -fever equal to or higher than 101 degrees Farenheit.  -cloudy urine with a strong, foul odor  -severe pain  Females should always wipe from front to back after elimination.  You may feel some burning pain when you urinate.  This should disappear with time.  Applying moist heat to the lower abdomen or a hot tub bath may help relieve the pain. \  You may remove the catheter in the morning by cutting the side arm off.   The catheter should just slide out.  If you don't feel comfortable doing that, you can come to the office tomorrow.     Patient Signature:  ________________________________________________________  Nurse's Signature:  ________________________________________________________  Indwelling Urinary Catheter Care, Adult Take good care of your catheter to keep it working and to prevent problems. How to wear your catheter Attach your catheter to your leg with tape (adhesive tape) or a leg strap. Make sure it is not too tight. If you use tape, remove any bits of tape that are already on the catheter. How to wear a drainage bag You should have:  A large overnight bag.  A small leg bag. Overnight Bag  You may wear the overnight bag at any time. Always keep the bag below the level  of your bladder but off the floor. When you sleep, put a clean plastic bag in a wastebasket. Then hang the bag inside the wastebasket. Leg Bag  Never wear the leg bag at night. Always wear the leg bag below your knee. Keep the leg bag secure with a leg strap or tape. How to care for your skin  Clean the skin around the catheter at least once every day.  Shower every day. Do not take baths.  Put creams, lotions, or ointments on your genital area only as told by your doctor.  Do not use powders, sprays, or lotions on your genital area. How to clean your catheter and your skin 1. Wash your hands with soap and water. 2. Wet a washcloth in warm water and gentle (mild) soap. 3. Use the washcloth to clean the skin where the catheter enters your body. Clean downward and wipe away from the catheter in small circles. Do not wipe toward the catheter. 4. Pat the area dry with a clean towel. Make sure to clean off all soap. How to care for your drainage bags Empty your drainage bag when it is ?- full or at least 2-3 times a day. Replace your drainage bag once a month or sooner if it starts to smell bad or look dirty. Do not clean your drainage bag unless told by your doctor. Emptying a drainage bag   Supplies Needed  Rubbing alcohol.  Gauze pad or cotton ball.  Tape  or a leg strap. Steps 1. Wash your hands with soap and water. 2. Separate (detach) the bag from your leg. 3. Hold the bag over the toilet or a clean container. Keep the bag below your hips and bladder. This stops pee (urine) from going back into the tube. 4. Open the pour spout at the bottom of the bag. 5. Empty the pee into the toilet or container. Do not let the pour spout touch any surface. 6. Put rubbing alcohol on a gauze pad or cotton ball. 7. Use the gauze pad or cotton ball to clean the pour spout. 8. Close the pour spout. 9. Attach the bag to your leg with tape or a leg strap. 10. Wash your hands. Changing a drainage  bag  Supplies Needed  Alcohol wipes.  A clean drainage bag.  Adhesive tape or a leg strap. Steps 1. Wash your hands with soap and water. 2. Separate the dirty bag from your leg. 3. Pinch the rubber catheter with your fingers so that pee does not spill out. 4. Separate the catheter tube from the drainage tube where these tubes connect (at the connection valve). Do not let the tubes touch any surface. 5. Clean the end of the catheter tube with an alcohol wipe. Use a different alcohol wipe to clean the end of the drainage tube. 6. Connect the catheter tube to the drainage tube of the clean bag. 7. Attach the new bag to the leg with adhesive tape or a leg strap. 8. Wash your hands. How to prevent infection and other problems  Never pull on your catheter or try to remove it. Pulling can damage tissue in your body.  Always wash your hands before and after touching your catheter.  If a leg strap gets wet, replace it with a dry one.  Drink enough fluids to keep your pee clear or pale yellow, or as told by your doctor.  Do not let the drainage bag or tubing touch the floor.  Wear cotton underwear.  If you are male, wipe from front to back after you poop (have a bowel movement).  Check on the catheter often to make sure it works and the tubing is not twisted. Get help if:  Your pee is cloudy.  Your pee smells unusually bad.  Your pee is not draining into the bag.  Your tube gets clogged.  Your catheter starts to leak.  Your bladder feels full. Get help right away if:  You have redness, swelling, or pain where the catheter enters your body.  You have fluid, pus, or a bad smell coming from the area where the catheter enters your body.  The area where the catheter enters your body feels warm.  You have a fever.  You have pain in your:  Stomach (abdomen).  Legs.  Lower back.  Bladder.  You see blood fill the catheter.  Your pee is pink or red.  You feel sick  to your stomach (nauseous).  You throw up (vomit).  You have chills.  Your catheter gets pulled out. This information is not intended to replace advice given to you by your health care provider. Make sure you discuss any questions you have with your health care provider. Document Released: 04/21/2012 Document Revised: 11/23/2015 Document Reviewed: 06/09/2013 Elsevier Interactive Patient Education  2017 [place] [facility] Home Care Instructions  Activity: Get plenty of rest for the remainder of the day. A responsible individual must stay with you for 24  hours following the procedure.  For the next 24 hours, DO NOT: -Drive a car -Paediatric nurse -Drink alcoholic beverages -Take any medication unless instructed by your physician -Make any legal decisions or sign important papers.  Meals: Start with liquid foods such as gelatin or soup. Progress to regular foods as tolerated. Avoid greasy, spicy, heavy foods. If nausea and/or vomiting occur, drink only clear liquids until the nausea and/or vomiting subsides. Call your physician if vomiting continues.  Special Instructions/Symptoms: Your throat may feel dry or sore from the anesthesia or the breathing tube placed in your throat during surgery. If this causes discomfort, gargle with warm salt water. The discomfort should disappear within 24 hours.  If you had a scopolamine patch placed behind your ear for the management of post- operative nausea and/or vomiting:  1. The medication in the patch is effective for 72 hours, after which it should be removed.  Wrap patch in a tissue and discard in the trash. Wash hands thoroughly with soap and water. 2. You may remove the patch earlier than 72 hours if you experience unpleasant side effects which may include dry mouth, dizziness or visual disturbances. 3. Avoid touching the patch. Wash your hands with soap and water after contact with the patch.

## 2016-06-05 NOTE — Transfer of Care (Signed)
Immediate Anesthesia Transfer of Care Note  Patient: [name]  Procedure(s) Performed: Procedure(s): CYSTOSCOPY WITH LITHOLAPAXY and fulgarization of bladder neck (N/A)  Patient Location: PACU  Anesthesia Type:General  Level of Consciousness: awake, alert , oriented and patient cooperative  Airway & Oxygen Therapy: Patient Spontanous Breathing and Patient connected to nasal cannula oxygen  Post-op Assessment: Report given to RN and Post -op Vital signs reviewed and stable  Post vital signs: Reviewed and stable  Last Vitals:  Vitals:   06/05/16 0738 06/05/16 0943  BP: (!) 174/88 138/81  Pulse: (!) 56 61  Resp: 16 19  Temp: 36.9 C 36.6 C    Last Pain:  Vitals:   06/05/16 0738  TempSrc: Oral      Patients Stated Pain Goal: 7 (93/55/21 7471)  Complications: No apparent anesthesia complications

## 2016-06-05 NOTE — Brief Op Note (Signed)
06/05/2016  9:33 AM  PATIENT:  [name]  81 y.o. male  PRE-OPERATIVE DIAGNOSIS:  BLADDER STONES, GROSS HEMATURIA  POST-OPERATIVE DIAGNOSIS:  BLADDER STONES, GROSS HEMATURIA  PROCEDURE:  Procedure(s): CYSTOSCOPY WITH LITHOLAPAXY >2.5cm  fulguration of bladder neck (N/A)  SURGEON:  Surgeon(s) and Role:    Irine Seal, MD - Primary  PHYSICIAN ASSISTANT:   ASSISTANTS: none   ANESTHESIA:   general  EBL:  No intake/output data recorded.  BLOOD ADMINISTERED:none  DRAINS: Urinary Catheter (Foley)   LOCAL MEDICATIONS USED:  NONE  SPECIMEN:  Source of Specimen:  bladder stones  DISPOSITION OF SPECIMEN:  to family to bring to office.   COUNTS:  YES  TOURNIQUET:  * No tourniquets in log *  DICTATION: .Other Dictation: Dictation Number [id number]  PLAN OF CARE: Discharge to home after PACU  PATIENT DISPOSITION:  PACU - hemodynamically stable.   Delay start of Pharmacological VTE agent (>24hrs) due to surgical blood loss or risk of bleeding: not applicable

## 2016-06-06 ENCOUNTER — Encounter (HOSPITAL_BASED_OUTPATIENT_CLINIC_OR_DEPARTMENT_OTHER): Payer: Self-pay | Admitting: Urology

## 2016-06-06 NOTE — Op Note (Signed)
NAME:  [name], [name] NO.:  MEDICAL RECORD NO.:  [id number]  LOCATION:                                 FACILITY:  PHYSICIAN:  Marshall Cork. Jeffie Pollock, M.D.         DATE OF BIRTH:  DATE OF PROCEDURE:  06/05/2016 DATE OF DISCHARGE:                              OPERATIVE REPORT   PROCEDURE:  Cystoscopy with cystolitholapaxy of greater than 2.5 cm bladder tumor with bladder stones and 2 fulguration of bladder neck.  PREOPERATIVE DIAGNOSIS:  Multiple bladder stones with greater than 2.5 cm total dimension.  POSTOPERATIVE DIAGNOSES:  Multiple bladder stones with greater than 2.5 cm total dimension with intraoperative bleeding from the bladder neck.  SURGEON:  Marshall Cork. Jeffie Pollock, M.D.  ANESTHESIA:  General.  SPECIMEN:  Bladder stones.  DRAINS:  A 20-French Foley catheter.  BLOOD LOSS:  Minimal.  COMPLICATIONS:  None.  INDICATIONS:  Mr. Boy is a 81 year old white male with a history of multiple bladder stones with the total diameter exceeding 2.5 cm.  He has BPH, but low symptom score, so was felt that cystolitholapaxy alone was indicated.  FINDINGS OF PROCEDURE:  He was taken to the operating room where he was given Ancef.  A general anesthetic was induced.  He was placed in lithotomy position.  He was fitted with PAS hose.  Perineum and genitalia were prepped with Betadine solution and draped in the usual sterile fashion.  Cystoscopy was performed using the 23-French scope and 30-degree lens.  Examination revealed a normal urethra.  The external sphincter was then intact.  The prostatic urethra was approximately 3 cm in length with primarily bilobar hyperplasia, but some middle lobe.  Examination of the bladder revealed mild-to-moderate trabeculation.  There were 3-4 stones at the base of the bladder.  The ureteral orifices were unremarkable. The largest stone had measured 17 mm on CT, but the cumulative diameter was in excess of 2.5 cm.  The  cystoscope was removed and the laser scope with laser bridge was then inserted.  This was fitted with a 30-degree lens and a 1000-micron holmium laser fiber.  The power was initially set on 1 watt and 40 hertz and the stone was engaged.  Eventually, the power was increased to 2 watts to provide more adequate fragmentation.  Once the stone was broken up into smaller fragments, the rate was cut back to 10 and further fragmentation was performed.  At this point, the laser scope was removed and was replaced with a 28- [place] continuous flow resectoscope sheath with a visual obturator.  The scope was then used to aspirate the stone fragments.  During the aspiration, he began to have some venous bleeding at the bladder neck. The visual obturator was replaced with an Beatrix Fetters handle with a bipolar loop and cauterization of the bladder, neck, and floor of the prostate was performed to control the bleeding.  A few additional fragments were removed, inspection revealed intact ureteral orifices, no significant bleeding from the bladder neck and no retained stone fragments.  The resectoscope was removed and a 20-French Foley catheter was inserted.  The balloon was filled with 10 mL of sterile  fluid.  The catheter was irrigated until the return was clear.  The catheter was placed to straight drainage.  He was taken down from lithotomy position. His anesthetic was reversed.  He was moved to recovery room in stable condition.  There were no complications.     Marshall Cork. Jeffie Pollock, M.D.     JJW/MEDQ  D:  06/05/2016  T:  06/05/2016  Job:  [id number]

## 2016-07-26 ENCOUNTER — Other Ambulatory Visit (HOSPITAL_COMMUNITY): Payer: Medicare Other

## 2016-07-27 ENCOUNTER — Telehealth: Payer: Self-pay | Admitting: Nurse Practitioner

## 2016-07-27 NOTE — Telephone Encounter (Signed)
New Message     Does he need to have another Echo done? Or did he misunderstand what he was told.

## 2016-07-27 NOTE — Telephone Encounter (Signed)
Left message for patient to call back  

## 2016-07-27 NOTE — Telephone Encounter (Signed)
I would favor not doing TEE based on last echo results but I will review with Dr. Darol Destine when I return from vacation.

## 2016-07-27 NOTE — Telephone Encounter (Signed)
Patient is wondering if he needs to have a TEE, due to his last echo results. Will forward to Truitt Merle NP. Patient is aware that Cecille Rubin is out of the office next week. Patient stated understanding.

## 2016-07-27 NOTE — Telephone Encounter (Signed)
Follow Up ° ° ° °Returning call from earlier. Please call. °

## 2016-08-02 NOTE — Telephone Encounter (Signed)
S/w pt is aware of Lori's recommendation's will call back next week with update.

## 2016-08-06 NOTE — Telephone Encounter (Signed)
Please call and let him know that other physicians have reviewed his echos - no need for TEE.  Will see back in 6 months.  Consider limited echo on return.

## 2016-08-06 NOTE — Telephone Encounter (Signed)
Dr. Marlou Porch,  Would you review - has already had 2 TTE's and give your recommendation?  Thanks Cecille Rubin

## 2016-08-06 NOTE — Telephone Encounter (Signed)
Agree, no need for transesophageal echocardiogram. I reviewed his echocardiograms once again and there is a calcified ridge at the location of eustachian valve. It remained stable.  Candee Furbish, MD

## 2016-08-07 NOTE — Telephone Encounter (Signed)
We can move out 6 months.

## 2016-08-07 NOTE — Telephone Encounter (Signed)
S/w pt per Cecille Rubin moved pt's appt out 4 months due to recent testing.

## 2016-08-07 NOTE — Telephone Encounter (Signed)
S/w pt is aware of physician's recommendation's . Pt does have a f/u in October with Truitt Merle, NP, that would pt pt's 6 month f/u.  Does this appointment need to be moved out to 6 months??  Will send to Hilda to [place].

## 2016-10-24 ENCOUNTER — Emergency Department (HOSPITAL_COMMUNITY): Payer: Medicare Other

## 2016-10-24 ENCOUNTER — Inpatient Hospital Stay (HOSPITAL_COMMUNITY): Payer: Medicare Other | Admitting: Certified Registered Nurse Anesthetist

## 2016-10-24 ENCOUNTER — Encounter (HOSPITAL_COMMUNITY)
Admission: EM | Disposition: A | Discharge status: Rehabilitation Facility | Payer: Self-pay | Source: Home / Self Care | Attending: Neurology

## 2016-10-24 ENCOUNTER — Encounter (HOSPITAL_COMMUNITY): Payer: Self-pay | Admitting: Certified Registered Nurse Anesthetist

## 2016-10-24 ENCOUNTER — Inpatient Hospital Stay (HOSPITAL_COMMUNITY): Payer: Medicare Other

## 2016-10-24 ENCOUNTER — Inpatient Hospital Stay (HOSPITAL_COMMUNITY)
Admission: EM | Admit: 2016-10-24 | Discharge: 2016-10-29 | DRG: 023 | Disposition: A | Discharge status: Rehabilitation Facility | Payer: Medicare Other | Attending: Neurology | Admitting: Neurology

## 2016-10-24 DIAGNOSIS — Z9842 Cataract extraction status, left eye: Secondary | ICD-10-CM

## 2016-10-24 DIAGNOSIS — Z952 Presence of prosthetic heart valve: Secondary | ICD-10-CM | POA: Diagnosis not present

## 2016-10-24 DIAGNOSIS — Z9049 Acquired absence of other specified parts of digestive tract: Secondary | ICD-10-CM

## 2016-10-24 DIAGNOSIS — I361 Nonrheumatic tricuspid (valve) insufficiency: Secondary | ICD-10-CM | POA: Diagnosis not present

## 2016-10-24 DIAGNOSIS — G8104 Flaccid hemiplegia affecting left nondominant side: Secondary | ICD-10-CM | POA: Diagnosis present

## 2016-10-24 DIAGNOSIS — I639 Cerebral infarction, unspecified: Secondary | ICD-10-CM | POA: Diagnosis present

## 2016-10-24 DIAGNOSIS — I10 Essential (primary) hypertension: Secondary | ICD-10-CM | POA: Diagnosis present

## 2016-10-24 DIAGNOSIS — R001 Bradycardia, unspecified: Secondary | ICD-10-CM | POA: Diagnosis present

## 2016-10-24 DIAGNOSIS — N400 Enlarged prostate without lower urinary tract symptoms: Secondary | ICD-10-CM

## 2016-10-24 DIAGNOSIS — Z9841 Cataract extraction status, right eye: Secondary | ICD-10-CM | POA: Diagnosis not present

## 2016-10-24 DIAGNOSIS — N401 Enlarged prostate with lower urinary tract symptoms: Secondary | ICD-10-CM

## 2016-10-24 DIAGNOSIS — I63411 Cerebral infarction due to embolism of right middle cerebral artery: Secondary | ICD-10-CM | POA: Diagnosis present

## 2016-10-24 DIAGNOSIS — E876 Hypokalemia: Secondary | ICD-10-CM | POA: Diagnosis present

## 2016-10-24 DIAGNOSIS — Z961 Presence of intraocular lens: Secondary | ICD-10-CM | POA: Diagnosis present

## 2016-10-24 DIAGNOSIS — I952 Hypotension due to drugs: Secondary | ICD-10-CM | POA: Diagnosis not present

## 2016-10-24 DIAGNOSIS — F329 Major depressive disorder, single episode, unspecified: Secondary | ICD-10-CM | POA: Diagnosis present

## 2016-10-24 DIAGNOSIS — I480 Paroxysmal atrial fibrillation: Secondary | ICD-10-CM | POA: Diagnosis present

## 2016-10-24 DIAGNOSIS — E872 Acidosis: Secondary | ICD-10-CM | POA: Diagnosis present

## 2016-10-24 DIAGNOSIS — I69391 Dysphagia following cerebral infarction: Secondary | ICD-10-CM | POA: Diagnosis not present

## 2016-10-24 DIAGNOSIS — I340 Nonrheumatic mitral (valve) insufficiency: Secondary | ICD-10-CM | POA: Diagnosis not present

## 2016-10-24 DIAGNOSIS — I69354 Hemiplegia and hemiparesis following cerebral infarction affecting left non-dominant side: Secondary | ICD-10-CM | POA: Diagnosis not present

## 2016-10-24 DIAGNOSIS — R4701 Aphasia: Secondary | ICD-10-CM | POA: Diagnosis present

## 2016-10-24 DIAGNOSIS — R1310 Dysphagia, unspecified: Secondary | ICD-10-CM | POA: Diagnosis present

## 2016-10-24 DIAGNOSIS — I63511 Cerebral infarction due to unspecified occlusion or stenosis of right middle cerebral artery: Secondary | ICD-10-CM | POA: Diagnosis not present

## 2016-10-24 DIAGNOSIS — Y92230 Patient room in hospital as the place of occurrence of the external cause: Secondary | ICD-10-CM | POA: Diagnosis not present

## 2016-10-24 DIAGNOSIS — Z8673 Personal history of transient ischemic attack (TIA), and cerebral infarction without residual deficits: Secondary | ICD-10-CM

## 2016-10-24 DIAGNOSIS — T4275XA Adverse effect of unspecified antiepileptic and sedative-hypnotic drugs, initial encounter: Secondary | ICD-10-CM | POA: Diagnosis not present

## 2016-10-24 DIAGNOSIS — R739 Hyperglycemia, unspecified: Secondary | ICD-10-CM | POA: Diagnosis not present

## 2016-10-24 DIAGNOSIS — G629 Polyneuropathy, unspecified: Secondary | ICD-10-CM | POA: Diagnosis present

## 2016-10-24 DIAGNOSIS — Z0189 Encounter for other specified special examinations: Secondary | ICD-10-CM

## 2016-10-24 DIAGNOSIS — I2510 Atherosclerotic heart disease of native coronary artery without angina pectoris: Secondary | ICD-10-CM | POA: Diagnosis present

## 2016-10-24 DIAGNOSIS — Z86010 Personal history of colonic polyps: Secondary | ICD-10-CM

## 2016-10-24 DIAGNOSIS — R9431 Abnormal electrocardiogram [ECG] [EKG]: Secondary | ICD-10-CM | POA: Diagnosis not present

## 2016-10-24 DIAGNOSIS — E1165 Type 2 diabetes mellitus with hyperglycemia: Secondary | ICD-10-CM | POA: Diagnosis present

## 2016-10-24 DIAGNOSIS — D62 Acute posthemorrhagic anemia: Secondary | ICD-10-CM | POA: Diagnosis not present

## 2016-10-24 DIAGNOSIS — I4891 Unspecified atrial fibrillation: Secondary | ICD-10-CM | POA: Diagnosis not present

## 2016-10-24 DIAGNOSIS — Z978 Presence of other specified devices: Secondary | ICD-10-CM

## 2016-10-24 DIAGNOSIS — G460 Middle cerebral artery syndrome: Secondary | ICD-10-CM | POA: Diagnosis present

## 2016-10-24 DIAGNOSIS — I69398 Other sequelae of cerebral infarction: Secondary | ICD-10-CM | POA: Diagnosis not present

## 2016-10-24 DIAGNOSIS — Z85828 Personal history of other malignant neoplasm of skin: Secondary | ICD-10-CM | POA: Diagnosis not present

## 2016-10-24 DIAGNOSIS — R066 Hiccough: Secondary | ICD-10-CM | POA: Diagnosis present

## 2016-10-24 DIAGNOSIS — K5730 Diverticulosis of large intestine without perforation or abscess without bleeding: Secondary | ICD-10-CM | POA: Diagnosis present

## 2016-10-24 DIAGNOSIS — Z8774 Personal history of (corrected) congenital malformations of heart and circulatory system: Secondary | ICD-10-CM | POA: Diagnosis not present

## 2016-10-24 DIAGNOSIS — Z7982 Long term (current) use of aspirin: Secondary | ICD-10-CM

## 2016-10-24 DIAGNOSIS — E119 Type 2 diabetes mellitus without complications: Secondary | ICD-10-CM

## 2016-10-24 DIAGNOSIS — E785 Hyperlipidemia, unspecified: Secondary | ICD-10-CM | POA: Diagnosis present

## 2016-10-24 DIAGNOSIS — Z8249 Family history of ischemic heart disease and other diseases of the circulatory system: Secondary | ICD-10-CM

## 2016-10-24 DIAGNOSIS — R338 Other retention of urine: Secondary | ICD-10-CM | POA: Diagnosis present

## 2016-10-24 DIAGNOSIS — I610 Nontraumatic intracerebral hemorrhage in hemisphere, subcortical: Secondary | ICD-10-CM | POA: Diagnosis present

## 2016-10-24 DIAGNOSIS — G8194 Hemiplegia, unspecified affecting left nondominant side: Secondary | ICD-10-CM | POA: Diagnosis not present

## 2016-10-24 DIAGNOSIS — R7303 Prediabetes: Secondary | ICD-10-CM | POA: Diagnosis not present

## 2016-10-24 DIAGNOSIS — I481 Persistent atrial fibrillation: Secondary | ICD-10-CM | POA: Diagnosis not present

## 2016-10-24 DIAGNOSIS — Z808 Family history of malignant neoplasm of other organs or systems: Secondary | ICD-10-CM

## 2016-10-24 DIAGNOSIS — D696 Thrombocytopenia, unspecified: Secondary | ICD-10-CM | POA: Diagnosis present

## 2016-10-24 DIAGNOSIS — E1142 Type 2 diabetes mellitus with diabetic polyneuropathy: Secondary | ICD-10-CM | POA: Diagnosis present

## 2016-10-24 DIAGNOSIS — R269 Unspecified abnormalities of gait and mobility: Secondary | ICD-10-CM | POA: Diagnosis not present

## 2016-10-24 DIAGNOSIS — Z882 Allergy status to sulfonamides status: Secondary | ICD-10-CM

## 2016-10-24 DIAGNOSIS — R29810 Facial weakness: Secondary | ICD-10-CM | POA: Diagnosis present

## 2016-10-24 DIAGNOSIS — Z953 Presence of xenogenic heart valve: Secondary | ICD-10-CM | POA: Diagnosis not present

## 2016-10-24 DIAGNOSIS — R29717 NIHSS score 17: Secondary | ICD-10-CM | POA: Diagnosis present

## 2016-10-24 DIAGNOSIS — M109 Gout, unspecified: Secondary | ICD-10-CM | POA: Diagnosis present

## 2016-10-24 HISTORY — PX: IR ANGIO VERTEBRAL SEL SUBCLAVIAN INNOMINATE BILAT MOD SED: IMG5366

## 2016-10-24 HISTORY — PX: RADIOLOGY WITH ANESTHESIA: SHX6223

## 2016-10-24 HISTORY — PX: IR ANGIO EXTRACRAN SEL COM CAROTID INNOMINATE UNI L MOD SED: IMG5355

## 2016-10-24 HISTORY — DX: Cerebral infarction, unspecified: I63.9

## 2016-10-24 HISTORY — PX: IR PERCUTANEOUS ART THROMBECTOMY/INFUSION INTRACRANIAL INC DIAG ANGIO: IMG6087

## 2016-10-24 LAB — CBC
HCT: 37.6 % — ABNORMAL LOW (ref 39.0–52.0)
Hemoglobin: 12.6 g/dL — ABNORMAL LOW (ref 13.0–17.0)
MCH: 32.4 pg (ref 26.0–34.0)
MCHC: 33.5 g/dL (ref 30.0–36.0)
MCV: 96.7 fL (ref 78.0–100.0)
Platelets: 112 10*3/uL — ABNORMAL LOW (ref 150–400)
RBC: 3.89 MIL/uL — ABNORMAL LOW (ref 4.22–5.81)
RDW: 13.7 % (ref 11.5–15.5)
WBC: 5.8 10*3/uL (ref 4.0–10.5)

## 2016-10-24 LAB — I-STAT TROPONIN, ED: Troponin i, poc: 0 ng/mL (ref 0.00–0.08)

## 2016-10-24 LAB — COMPREHENSIVE METABOLIC PANEL
ALT: 20 U/L (ref 17–63)
AST: 28 U/L (ref 15–41)
Albumin: 4.1 g/dL (ref 3.5–5.0)
Alkaline Phosphatase: 70 U/L (ref 38–126)
Anion gap: 7 (ref 5–15)
BUN: 13 mg/dL (ref 6–20)
CO2: 20 mmol/L — ABNORMAL LOW (ref 22–32)
Calcium: 8.9 mg/dL (ref 8.9–10.3)
Chloride: 111 mmol/L (ref 101–111)
Creatinine, Ser: 0.82 mg/dL (ref 0.61–1.24)
GFR calc Af Amer: >60 mL/min (ref >60)
GFR calc non Af Amer: >60 mL/min (ref >60)
Glucose, Bld: 124 mg/dL — ABNORMAL HIGH (ref 65–99)
Potassium: 4 mmol/L (ref 3.5–5.1)
Sodium: 138 mmol/L (ref 135–145)
Total Bilirubin: 0.9 mg/dL (ref 0.3–1.2)
Total Protein: 7 g/dL (ref 6.5–8.1)

## 2016-10-24 LAB — PROTIME-INR
INR: 1.1
Prothrombin Time: 14.1 seconds (ref 11.4–15.2)

## 2016-10-24 LAB — TROPONIN I: Troponin I: <0.03 ng/mL (ref <0.03)

## 2016-10-24 LAB — POCT I-STAT 3, ART BLOOD GAS (G3+)
Acid-base deficit: 6 mmol/L — ABNORMAL HIGH (ref 0.0–2.0)
Bicarbonate: 19.4 mmol/L — ABNORMAL LOW (ref 20.0–28.0)
O2 Saturation: 97 %
TCO2: 21 mmol/L — ABNORMAL LOW (ref 22–32)
pCO2 arterial: 37.8 mmHg (ref 32.0–48.0)
pH, Arterial: 7.318 — ABNORMAL LOW (ref 7.350–7.450)
pO2, Arterial: 94 mmHg (ref 83.0–108.0)

## 2016-10-24 LAB — DIFFERENTIAL
Basophils Absolute: 0 10*3/uL (ref 0.0–0.1)
Basophils Relative: 1 %
Eosinophils Absolute: 0.2 10*3/uL (ref 0.0–0.7)
Eosinophils Relative: 3 %
Lymphocytes Relative: 30 %
Lymphs Abs: 1.8 10*3/uL (ref 0.7–4.0)
Monocytes Absolute: 0.8 10*3/uL (ref 0.1–1.0)
Monocytes Relative: 14 %
Neutro Abs: 3 10*3/uL (ref 1.7–7.7)
Neutrophils Relative %: 52 %

## 2016-10-24 LAB — CBG MONITORING, ED: Glucose-Capillary: 113 mg/dL — ABNORMAL HIGH (ref 65–99)

## 2016-10-24 LAB — I-STAT CHEM 8, ED
BUN: 13 mg/dL (ref 6–20)
Calcium, Ion: 1.09 mmol/L — ABNORMAL LOW (ref 1.15–1.40)
Chloride: 109 mmol/L (ref 101–111)
Creatinine, Ser: 0.7 mg/dL (ref 0.61–1.24)
Glucose, Bld: 119 mg/dL — ABNORMAL HIGH (ref 65–99)
HCT: 39 % (ref 39.0–52.0)
Hemoglobin: 13.3 g/dL (ref 13.0–17.0)
Potassium: 4 mmol/L (ref 3.5–5.1)
Sodium: 143 mmol/L (ref 135–145)
TCO2: 20 mmol/L — ABNORMAL LOW (ref 22–32)

## 2016-10-24 LAB — MRSA PCR SCREENING: MRSA by PCR: NEGATIVE

## 2016-10-24 LAB — GLUCOSE, CAPILLARY
Glucose-Capillary: 130 mg/dL — ABNORMAL HIGH (ref 65–99)
Glucose-Capillary: 101 mg/dL — ABNORMAL HIGH (ref 65–99)
Glucose-Capillary: 114 mg/dL — ABNORMAL HIGH (ref 65–99)

## 2016-10-24 LAB — APTT: aPTT: 37 seconds — ABNORMAL HIGH (ref 24–36)

## 2016-10-24 LAB — TRIGLYCERIDES: Triglycerides: 67 mg/dL (ref <150)

## 2016-10-24 SURGERY — RADIOLOGY WITH ANESTHESIA
Anesthesia: General

## 2016-10-24 MED ORDER — BISACODYL 10 MG RE SUPP: 10.0000 mg | Freq: Every day | RECTAL | Status: DC | PRN

## 2016-10-24 MED ORDER — LIDOCAINE HCL (CARDIAC) 20 MG/ML IV SOLN
INTRAVENOUS | Status: DC | PRN
  Administered 2016-10-24: 100 mg via INTRAVENOUS

## 2016-10-24 MED ORDER — FENTANYL CITRATE (PF) 100 MCG/2ML IJ SOLN
INTRAMUSCULAR | Status: DC | PRN
  Administered 2016-10-24: 50 ug via INTRAVENOUS

## 2016-10-24 MED ORDER — ONDANSETRON HCL 4 MG/2ML IJ SOLN: 4.0000 mg | Freq: Four times a day (QID) | INTRAMUSCULAR | Status: DC | PRN

## 2016-10-24 MED ORDER — FENTANYL CITRATE (PF) 100 MCG/2ML IJ SOLN
50.0000 ug | Freq: Once | INTRAMUSCULAR | Status: DC
Start: 2016-10-24 — End: 2016-10-27

## 2016-10-24 MED ORDER — NITROGLYCERIN 1 MG/10 ML FOR IR/CATH LAB
INTRA_ARTERIAL | Status: AC | PRN
  Administered 2016-10-24: 25 ug via INTRA_ARTERIAL

## 2016-10-24 MED ORDER — SODIUM CHLORIDE 0.9 % IV SOLN
0.0000 ug/min | INTRAVENOUS | Status: DC
  Administered 2016-10-24 (×2): 60 ug/min via INTRAVENOUS
  Filled 2016-10-24 (×2): qty 1

## 2016-10-24 MED ORDER — NICARDIPINE HCL IN NACL 20-0.86 MG/200ML-% IV SOLN: 0.0000 mg/h | INTRAVENOUS | Status: DC

## 2016-10-24 MED ORDER — PROPOFOL 500 MG/50ML IV EMUL
INTRAVENOUS | Status: DC | PRN
  Administered 2016-10-24: 75 ug/kg/min via INTRAVENOUS

## 2016-10-24 MED ORDER — PANTOPRAZOLE SODIUM 40 MG IV SOLR
40.0000 mg | Freq: Every day | INTRAVENOUS | Status: DC
  Administered 2016-10-24 – 2016-10-25 (×2): 40 mg via INTRAVENOUS
  Filled 2016-10-24 (×2): qty 40

## 2016-10-24 MED ORDER — NICARDIPINE HCL IN NACL 20-0.86 MG/200ML-% IV SOLN: 3.0000 mg/h | INTRAVENOUS | Status: DC

## 2016-10-24 MED ORDER — STROKE: EARLY STAGES OF RECOVERY BOOK
Freq: Once | Status: AC
  Administered 2016-10-24: 1
  Filled 2016-10-24: qty 1

## 2016-10-24 MED ORDER — SODIUM CHLORIDE 0.9 % IV SOLN: INTRAVENOUS | Status: DC

## 2016-10-24 MED ORDER — ACETAMINOPHEN 650 MG RE SUPP: 650.0000 mg | RECTAL | Status: DC | PRN

## 2016-10-24 MED ORDER — CEFAZOLIN SODIUM-DEXTROSE 2-3 GM-%(50ML) IV SOLR
INTRAVENOUS | Status: DC | PRN
  Administered 2016-10-24: 2 g via INTRAVENOUS

## 2016-10-24 MED ORDER — LABETALOL HCL 5 MG/ML IV SOLN: 20.0000 mg | Freq: Once | INTRAVENOUS | Status: DC

## 2016-10-24 MED ORDER — SODIUM CHLORIDE 0.9 % IV SOLN
INTRAVENOUS | Status: DC | PRN
  Administered 2016-10-24 (×2): via INTRAVENOUS

## 2016-10-24 MED ORDER — IOPAMIDOL (ISOVUE-300) INJECTION 61%
INTRAVENOUS | Status: AC
  Administered 2016-10-24: 40 mL
  Filled 2016-10-24: qty 150

## 2016-10-24 MED ORDER — INSULIN ASPART 100 UNIT/ML ~~LOC~~ SOLN
0.0000 [IU] | SUBCUTANEOUS | Status: DC
  Administered 2016-10-25: 2 [IU] via SUBCUTANEOUS
  Administered 2016-10-25 – 2016-10-26 (×3): 1 [IU] via SUBCUTANEOUS

## 2016-10-24 MED ORDER — FENTANYL 2500MCG IN NS 250ML (10MCG/ML) PREMIX INFUSION
25.0000 ug/h | INTRAVENOUS | Status: DC
  Administered 2016-10-24: 50 ug/h via INTRAVENOUS
  Administered 2016-10-25: 300 ug/h via INTRAVENOUS
  Filled 2016-10-24 (×2): qty 250

## 2016-10-24 MED ORDER — CHLORHEXIDINE GLUCONATE 0.12% ORAL RINSE (MEDLINE KIT)
15.0000 mL | Freq: Two times a day (BID) | OROMUCOSAL | Status: DC
  Administered 2016-10-25 – 2016-10-26 (×3): 15 mL via OROMUCOSAL

## 2016-10-24 MED ORDER — ACETAMINOPHEN 325 MG PO TABS: 650.0000 mg | ORAL_TABLET | ORAL | Status: DC | PRN

## 2016-10-24 MED ORDER — PROPOFOL 1000 MG/100ML IV EMUL
0.0000 ug/kg/min | INTRAVENOUS | Status: DC
  Administered 2016-10-24: 70 ug/kg/min via INTRAVENOUS
  Filled 2016-10-24: qty 100

## 2016-10-24 MED ORDER — FENTANYL BOLUS VIA INFUSION
25.0000 ug | INTRAVENOUS | Status: DC | PRN
  Filled 2016-10-24: qty 25

## 2016-10-24 MED ORDER — NITROGLYCERIN 1 MG/10 ML FOR IR/CATH LAB
INTRA_ARTERIAL | Status: AC
  Filled 2016-10-24: qty 10

## 2016-10-24 MED ORDER — CEFAZOLIN SODIUM-DEXTROSE 2-4 GM/100ML-% IV SOLN
INTRAVENOUS | Status: AC
  Filled 2016-10-24: qty 100

## 2016-10-24 MED ORDER — ALTEPLASE (STROKE) FULL DOSE INFUSION
0.9000 mg/kg | Freq: Once | INTRAVENOUS | Status: AC
  Administered 2016-10-24: 77 mg via INTRAVENOUS
  Filled 2016-10-24: qty 100

## 2016-10-24 MED ORDER — ORAL CARE MOUTH RINSE: 15.0000 mL | Freq: Four times a day (QID) | OROMUCOSAL | Status: DC

## 2016-10-24 MED ORDER — SENNOSIDES-DOCUSATE SODIUM 8.6-50 MG PO TABS: 1.0000 | ORAL_TABLET | Freq: Every evening | ORAL | Status: DC | PRN

## 2016-10-24 MED ORDER — IOPAMIDOL (ISOVUE-370) INJECTION 76%
INTRAVENOUS | Status: AC
  Filled 2016-10-24: qty 100

## 2016-10-24 MED ORDER — ACETAMINOPHEN 160 MG/5ML PO SOLN
650.0000 mg | ORAL | Status: DC | PRN
  Administered 2016-10-25: 650 mg
  Filled 2016-10-24: qty 20.3

## 2016-10-24 MED ORDER — ROCURONIUM BROMIDE 100 MG/10ML IV SOLN
INTRAVENOUS | Status: DC | PRN
  Administered 2016-10-24: 50 mg via INTRAVENOUS

## 2016-10-24 MED ORDER — CHLORHEXIDINE GLUCONATE 0.12% ORAL RINSE (MEDLINE KIT)
15.0000 mL | Freq: Two times a day (BID) | OROMUCOSAL | Status: DC
  Administered 2016-10-24: 15 mL via OROMUCOSAL

## 2016-10-24 MED ORDER — PROPOFOL 10 MG/ML IV BOLUS
INTRAVENOUS | Status: DC | PRN
  Administered 2016-10-24: 100 mg via INTRAVENOUS

## 2016-10-24 MED ORDER — IOPAMIDOL (ISOVUE-300) INJECTION 61%
INTRAVENOUS | Status: AC
  Administered 2016-10-24: 55 mL
  Filled 2016-10-24: qty 300

## 2016-10-24 MED ORDER — ORAL CARE MOUTH RINSE
15.0000 mL | OROMUCOSAL | Status: DC
  Administered 2016-10-24 – 2016-10-25 (×9): 15 mL via OROMUCOSAL

## 2016-10-24 MED ORDER — EPHEDRINE SULFATE 50 MG/ML IJ SOLN
INTRAMUSCULAR | Status: DC | PRN
  Administered 2016-10-24 (×6): 10 mg via INTRAVENOUS

## 2016-10-24 MED ORDER — FENTANYL CITRATE (PF) 100 MCG/2ML IJ SOLN: 50.0000 ug | INTRAMUSCULAR | Status: DC | PRN

## 2016-10-24 MED ORDER — PHENYLEPHRINE HCL 10 MG/ML IJ SOLN
INTRAVENOUS | Status: DC | PRN
  Administered 2016-10-24: 40 ug/min via INTRAVENOUS

## 2016-10-24 MED ORDER — SODIUM CHLORIDE 0.9 % IV SOLN
INTRAVENOUS | Status: DC
  Administered 2016-10-24 – 2016-10-25 (×2): via INTRAVENOUS

## 2016-10-24 MED ORDER — DOPAMINE-DEXTROSE 3.2-5 MG/ML-% IV SOLN
0.0000 ug/kg/min | INTRAVENOUS | Status: DC
  Administered 2016-10-24: 5 ug/kg/min via INTRAVENOUS
  Administered 2016-10-25: 20 ug/kg/min via INTRAVENOUS
  Filled 2016-10-24 (×2): qty 250

## 2016-10-24 MED ORDER — ACETAMINOPHEN 160 MG/5ML PO SOLN: 650.0000 mg | ORAL | Status: DC | PRN

## 2016-10-24 MED ORDER — EPTIFIBATIDE 20 MG/10ML IV SOLN
INTRAVENOUS | Status: AC
  Filled 2016-10-24: qty 10

## 2016-10-24 MED ORDER — SUCCINYLCHOLINE CHLORIDE 20 MG/ML IJ SOLN
INTRAMUSCULAR | Status: DC | PRN
  Administered 2016-10-24: 120 mg via INTRAVENOUS

## 2016-10-24 NOTE — Anesthesia Preprocedure Evaluation (Addendum)
Anesthesia Evaluation  Patient identified by MRN, date of birth, ID band Patient awake    Reviewed: Allergy & Precautions, NPO status Preop documentation limited or incomplete due to emergent nature of procedure.  Airway Mallampati: II  TM Distance: >3 FB Neck ROM: Full    Dental  (+) Teeth Intact, Dental Advisory Given   Pulmonary    breath sounds clear to auscultation- rhonchi       Cardiovascular hypertension, Pt. on medications + CAD and + DOE  + Valvular Problems/Murmurs  Rhythm:Regular Rate:Normal     Neuro/Psych CVA    GI/Hepatic   Endo/Other  Pre-diabetes   Renal/GU      Musculoskeletal   Abdominal   Peds  Hematology   Anesthesia Other Findings   Reproductive/Obstetrics                            Anesthesia Physical Anesthesia Plan  ASA: III and emergent  Anesthesia Plan: General   Post-op Pain Management:    Induction: Intravenous  PONV Risk Score and Plan: 2 and Ondansetron and Dexamethasone  Airway Management Planned: Oral ETT  Additional Equipment: Arterial line  Intra-op Plan:   Post-operative Plan: Post-operative intubation/ventilation  Informed Consent:   Dental advisory given  Plan Discussed with: CRNA, Anesthesiologist and Surgeon  Anesthesia Plan Comments:        Anesthesia Quick Evaluation
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
## 2016-10-24 NOTE — Progress Notes (Signed)
Pt transported on vent from PACU to 4N21.  Pt's vitals remained stable throughout.  Report given to Unit RT.
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
## 2016-10-24 NOTE — Transfer of Care (Signed)
Immediate Anesthesia Transfer of Care Note  Patient: [name]  Procedure(s) Performed: RADIOLOGY WITH ANESTHESIA (N/A )  Patient Location: PACU  Anesthesia Type:General  Level of Consciousness: sedated, unresponsive and Patient remains intubated per anesthesia plan  Airway & Oxygen Therapy: Patient remains intubated per anesthesia plan and Patient placed on Ventilator (see vital sign flow sheet for setting)  Post-op Assessment: Report given to RN and Post -op Vital signs reviewed and stable  Post vital signs: Reviewed and stable  Last Vitals:  Vitals:   10/24/16 1352 10/24/16 1605  BP: 109/78   Pulse: 96   Resp: (!) 8   Temp: 36.4 C (!) 36.4 C  SpO2: 100%     Last Pain:  Vitals:   10/24/16 1605  TempSrc:   PainSc: (P) Asleep         Complications: No apparent anesthesia complications

## 2016-10-24 NOTE — Progress Notes (Signed)
eLink Physician-Brief Progress Note Patient Name: [name] DOB: 10/06/1935 MRN: [id number]   Date of Service  10/24/2016  HPI/Events of Note  Rn notes bradycardia; I do not appreciate P waves on monitor. Rate 35.   eICU Interventions  Check 12 lead ECG; Change phenylephrine to dopamine. Will ask ICU team to eval.      Intervention Category Major Interventions: Arrhythmia - evaluation and management  [name] 10/24/2016, 10:52 PM
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
## 2016-10-24 NOTE — ED Notes (Addendum)
Pt's CBG result was 113. Informed Dr. Thomasene Lot & [facility] - RN.

## 2016-10-24 NOTE — Anesthesia Procedure Notes (Signed)
Procedure Name: Intubation Date/Time: 10/24/2016 1:52 PM Performed by: Carney Living Pre-anesthesia Checklist: Patient identified, Emergency Drugs available, Suction available, Patient being monitored and Timeout performed Patient Re-evaluated:Patient Re-evaluated prior to induction Oxygen Delivery Method: Circle system utilized Preoxygenation: Pre-oxygenation with 100% oxygen Induction Type: IV induction Laryngoscope Size: McGraph and 4 Grade View: Grade I Tube type: Subglottic suction tube Tube size: 7.0 mm Number of attempts: 1 Airway Equipment and Method: Stylet Placement Confirmation: ETT inserted through vocal cords under direct vision,  positive ETCO2 and breath sounds checked- equal and bilateral Secured at: 22 cm Tube secured with: Tape Dental Injury: Teeth and Oropharynx as per pre-operative assessment

## 2016-10-24 NOTE — Anesthesia Procedure Notes (Signed)
Arterial Line Insertion Performed by: Nolon Nations  Patient location: Pre-op. Preanesthetic checklist: patient identified, IV checked, site marked, risks and benefits discussed, surgical consent, monitors and equipment checked, pre-op evaluation, timeout performed and anesthesia consent Lidocaine 1% used for infiltration radial was placed Catheter size: 20 Fr Hand hygiene performed  and maximum sterile barriers used   Attempts: 2 Procedure performed without using ultrasound guided technique. Following insertion, dressing applied. Post procedure assessment: normal and unchanged  Patient tolerated the procedure well with no immediate complications.

## 2016-10-24 NOTE — Progress Notes (Signed)
RT called to place patient on vent in CT. Patient was coming from IR to CT. Patient placed on vent and then transported from CT to PACU on vent with no apparent complications and no signs of distress. Vitals were stable throughout and currently. RT called report to PACU therapist. RT will continue to monitor.

## 2016-10-24 NOTE — Progress Notes (Signed)
Patient ID: CONNELLY NETTERVILLE, male   DOB: [birth year], 81 y.o.   MRN: [id number] INR. 81 yr old male with witnessed acute neuro deficit at 12noon. Premorbid modifiied Rankin of 0.INH 17. CT brain No ICH.RT MCA hyperdense sign. ASPECTS 10. CTA  Brain RT MCA occlusion.. C TP CBF < 30 5 vol 1ml. Tmax>6.0s  Vol 334 ml. Mismatch 370ml. Ratio 55.7. Marland Kitchen Option of endovascular revascularization  discussed with  patients spouse and son ,to potentially prevent further neurological injury.Procedure ,riskds of ICH of 10 to 15 % with worseniing neurloogical injury,vent dependency,death and inability to revascularize were fully reviewed. Questions were answered to their satisfaction.. Informed witnessed consent was obtained for endovascular revascularization under [place]. S.Elvin Mccartin MD.

## 2016-10-24 NOTE — Procedures (Signed)
S/P 4 vessel cerebral arteriogram. RT CFA approach. Findings. 1.RT MCA prox occlusion. Complete revascularization with x1 pass with embotrap 3.18mmx 21mm,and x1 pass with 39mm x 40 mm solitaire FR retrieval device achieving a TICI 3 reperfusion.

## 2016-10-25 ENCOUNTER — Inpatient Hospital Stay (HOSPITAL_COMMUNITY): Payer: Medicare Other

## 2016-10-25 ENCOUNTER — Encounter (HOSPITAL_COMMUNITY): Payer: Self-pay | Admitting: Interventional Radiology

## 2016-10-25 DIAGNOSIS — I4891 Unspecified atrial fibrillation: Secondary | ICD-10-CM

## 2016-10-25 LAB — GLUCOSE, CAPILLARY
Glucose-Capillary: 119 mg/dL — ABNORMAL HIGH (ref 65–99)
Glucose-Capillary: 120 mg/dL — ABNORMAL HIGH (ref 65–99)
Glucose-Capillary: 125 mg/dL — ABNORMAL HIGH (ref 65–99)
Glucose-Capillary: 130 mg/dL — ABNORMAL HIGH (ref 65–99)
Glucose-Capillary: 195 mg/dL — ABNORMAL HIGH (ref 65–99)
Glucose-Capillary: 79 mg/dL (ref 65–99)
Glucose-Capillary: 99 mg/dL (ref 65–99)

## 2016-10-25 LAB — BLOOD GAS, ARTERIAL
Acid-base deficit: 4.4 mmol/L — ABNORMAL HIGH (ref 0.0–2.0)
Bicarbonate: 20.8 mmol/L (ref 20.0–28.0)
Drawn by: 441661
FIO2: 50
MECHVT: 620 mL
O2 Saturation: 95.9 %
PEEP: 5 cmH2O
Patient temperature: 98.6
RATE: 12 resp/min
pCO2 arterial: 43 mmHg (ref 32.0–48.0)
pH, Arterial: 7.306 — ABNORMAL LOW (ref 7.350–7.450)
pO2, Arterial: 88.1 mmHg (ref 83.0–108.0)

## 2016-10-25 LAB — TROPONIN I: Troponin I: <0.03 ng/mL (ref <0.03)

## 2016-10-25 LAB — HEMOGLOBIN A1C
Hgb A1c MFr Bld: 5.6 % (ref 4.8–5.6)
Mean Plasma Glucose: 114.02 mg/dL

## 2016-10-25 LAB — LIPID PANEL
Cholesterol: 87 mg/dL (ref 0–200)
HDL: 26 mg/dL — ABNORMAL LOW (ref >40)
LDL Cholesterol: 50 mg/dL (ref 0–99)
Total CHOL/HDL Ratio: 3.3 RATIO
Triglycerides: 54 mg/dL (ref <150)
VLDL: 11 mg/dL (ref 0–40)

## 2016-10-25 LAB — PHOSPHORUS: Phosphorus: 3.2 mg/dL (ref 2.5–4.6)

## 2016-10-25 LAB — BASIC METABOLIC PANEL
Anion gap: 7 (ref 5–15)
BUN: 13 mg/dL (ref 6–20)
CO2: 21 mmol/L — ABNORMAL LOW (ref 22–32)
Calcium: 7.6 mg/dL — ABNORMAL LOW (ref 8.9–10.3)
Chloride: 110 mmol/L (ref 101–111)
Creatinine, Ser: 0.86 mg/dL (ref 0.61–1.24)
GFR calc Af Amer: >60 mL/min (ref >60)
GFR calc non Af Amer: >60 mL/min (ref >60)
Glucose, Bld: 194 mg/dL — ABNORMAL HIGH (ref 65–99)
Potassium: 3.6 mmol/L (ref 3.5–5.1)
Sodium: 138 mmol/L (ref 135–145)

## 2016-10-25 LAB — CBC WITH DIFFERENTIAL/PLATELET
Basophils Absolute: 0 10*3/uL (ref 0.0–0.1)
Basophils Relative: 0 %
Eosinophils Absolute: 0.1 10*3/uL (ref 0.0–0.7)
Eosinophils Relative: 1 %
HCT: 33.8 % — ABNORMAL LOW (ref 39.0–52.0)
Hemoglobin: 11.1 g/dL — ABNORMAL LOW (ref 13.0–17.0)
Lymphocytes Relative: 15 %
Lymphs Abs: 1.1 10*3/uL (ref 0.7–4.0)
MCH: 31.9 pg (ref 26.0–34.0)
MCHC: 32.8 g/dL (ref 30.0–36.0)
MCV: 97.1 fL (ref 78.0–100.0)
Monocytes Absolute: 0.9 10*3/uL (ref 0.1–1.0)
Monocytes Relative: 12 %
Neutro Abs: 5.3 10*3/uL (ref 1.7–7.7)
Neutrophils Relative %: 71 %
Platelets: 101 10*3/uL — ABNORMAL LOW (ref 150–400)
RBC: 3.48 MIL/uL — ABNORMAL LOW (ref 4.22–5.81)
RDW: 13.7 % (ref 11.5–15.5)
WBC: 7.5 10*3/uL (ref 4.0–10.5)

## 2016-10-25 LAB — TSH: TSH: 0.834 u[IU]/mL (ref 0.350–4.500)

## 2016-10-25 LAB — MAGNESIUM: Magnesium: 1.9 mg/dL (ref 1.7–2.4)

## 2016-10-25 MED ORDER — POTASSIUM CHLORIDE 20 MEQ/15ML (10%) PO SOLN
40.0000 meq | Freq: Once | ORAL | Status: AC
  Administered 2016-10-25: 40 meq
  Filled 2016-10-25: qty 30

## 2016-10-25 MED ORDER — MAGNESIUM SULFATE IN D5W 1-5 GM/100ML-% IV SOLN
1.0000 g | Freq: Once | INTRAVENOUS | Status: AC
  Administered 2016-10-25: 1 g via INTRAVENOUS
  Filled 2016-10-25: qty 100

## 2016-10-25 MED ORDER — ASPIRIN 325 MG PO TABS
325.0000 mg | ORAL_TABLET | Freq: Every day | ORAL | Status: DC
  Administered 2016-10-25 – 2016-10-29 (×5): 325 mg via NASOGASTRIC
  Filled 2016-10-25 (×6): qty 1

## 2016-10-25 MED ORDER — VITAL AF 1.2 CAL PO LIQD: 1000.0000 mL | ORAL | Status: DC
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
## 2016-10-25 NOTE — Procedures (Signed)
Extubation Procedure Note  Patient Details:   Name: [name] DOB: February 27, 1935 MRN: [id number]   Airway Documentation:  Airway (Active)    Evaluation  O2 sats: stable throughout Complications: No apparent complications Patient did tolerate procedure well. Bilateral Breath Sounds: Coarse crackles    No cuff leak, MD notified and verbal order to proceed with extubation given.  MD watched via E-link.   Yes  Estill Bamberg 10/25/2016, 8:08 PM

## 2016-10-25 NOTE — Evaluation (Signed)
Patient seen at bedside and discussed with nursing. Earlier became bradycardic and drips were turned off briefly to determine if this was a medication side effect. Was moving right side purposefully off propofol with some nonpurposeful movement on the left. Team was worried about possible groin sheath displacement, therefore propofol was restarted.   BP 111/63   Pulse 78   Temp 98.6 F (37 C) (Axillary)   Resp 12   Ht 6' (1.829 m)   Wt 85.1 kg (187 lb 9.8 oz)   SpO2 97%   BMI 25.44 kg/m   Currently on propofol sedation. Pupils equal at 3 mm and sluggishly reactive. Limbs with decreased tone x 4 and no movement to tactile stimulation.  Will hold off on performing exam off sedation given presence of sheath. Stroke team to follow in the AM.  Electronically signed: Dr. Kerney Elbe
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
## 2016-10-25 NOTE — Anesthesia Postprocedure Evaluation (Signed)
Anesthesia Post Note  Patient: Heliodoro Domagalski Ledger  Procedure(s) Performed: RADIOLOGY WITH ANESTHESIA (N/A )     Patient location during evaluation: PACU Anesthesia Type: General Level of consciousness: sedated and patient remains intubated per anesthesia plan Pain management: pain level controlled Vital Signs Assessment: post-procedure vital signs reviewed and stable Respiratory status: patient remains intubated per anesthesia plan Cardiovascular status: stable Anesthetic complications: no    Last Vitals:  Vitals:   10/25/16 0914 10/25/16 1000  BP: 114/62 132/73  Pulse: 89 81  Resp: 14   Temp:    SpO2:  98%    Last Pain:  Vitals:   10/25/16 0400  TempSrc: Axillary  PainSc:    Pain Goal:    LLE Motor Response: No movement to painful stimulus (10/25/16 1000)   RLE Motor Response: Purposeful movement (10/25/16 1000)        Nolon Nations

## 2016-10-25 NOTE — Consult Note (Signed)
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 recommend starting heparin drip while patient is intubated; he will transition to coumadin and not a NOAC given his tissue valve for anticoagulation - brain MRI with small amount of hemorrhage in right putamen; will defer to neurology on timing of anticoagulation - pt does not require rate-controlling agents at this time. Given his normal EF, if he transitioned to RVR, could use diltiazem drip to control rate - Mg 1.9, keep near 2.0 - K 3.6, keep near 4.0 - TSH pending - he had a recent myoview of this year without signs of reversible ischemia. troponins have been negative. Will not pursue ischemic evaluation unless echo is changed from prior (05/2016).   2. HTN - marginal pressures currenlty - home meds include norvasc, atenolol (proscar and flomax)   3.   Right atrial density - this was reviewed at  length earlier this year - will obtain repeat echo for wall motion abnormalities and to assess changes in this denisty   For questions or updates, please contact [facility] Please consult [email or web address] for contact info under Cardiology/STEMI.   Signed, Angela Nicole Duke, PA  10/25/2016 1:34 PM   Personally seen and examined. Agree with above.  81-year-old male post aortic valve replacement, bioprosthetic several years ago here with stroke, possibly related to newly discovered atrial fibrillation.  Currently he is opening eyes and moving his hands to commands but still remains ventilated. Physical exam: Slightly awake, mittens in place, ET tube in place, irregularly irregular rhythm, 1/6 systolic murmur right upper sternal border, lungs clear, no edema  Atrial fibrillation, paroxysmal - Newly discovered on this admission for stroke. - Recommend anticoagulation with Coumadin when timing is reasonable.  Currently at risk for possible hemorrhagic transformation of stroke. -Currently well rate controlled. -Awaiting new echocardiogram.  Right atrial density - This was seen on previous echocardiograms and followed under surveillance.  This density possibly represented a calcified ridge of his eustachian valve, a fetal remnant.  If we do perform a TEE during this hospitalization, better visualization of the right atrium but can take place.  Acute stroke - Per stroke team.  Lengthy discussion with his wife and son.  Gerald Skains, MD    

## 2016-10-25 NOTE — Progress Notes (Signed)
SLP Cancellation Note  Patient Details Name: RUSSEL MORAIN MRN: [id number] DOB: 08-12-1935   Cancelled treatment:       Reason Eval/Treat Not Completed: Medical issues which prohibited therapy. Pt intubated   Maicey Barrientez, Katherene Ponto 10/25/2016, 8:25 AM
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
## 2016-10-25 NOTE — Progress Notes (Signed)
64fr FRA sheath removed by Samuel Jester RT R using manual pressure and v-pad at 1108am.  Groin had minimal hematoma just above sheath insertion site upon removal.  Distal pulses 3+.  Hemostasis obtained at 1145.   Hematoma resolved upon completion of groin site pressure. Tegaderm and pressure dressing applied after site reviewed with Lauren RN. Distal pulses intact.

## 2016-10-25 NOTE — Progress Notes (Signed)
Pt was transported to/from MRI via vent w/ no apparent complications.

## 2016-10-25 NOTE — Progress Notes (Signed)
[place] Progress Note Patient Name: [name] DOB: 1935/03/07 MRN: [id number]   Date of Service  10/25/2016  HPI/Events of Note  Asked to assess extubation Awake, follows commands well Strong cough spont TV 1680 with deep breath Weaning cpap 5 ps5 meets rsbi etc Good airway protection skills Will extubate  eICU Interventions       Intervention Category Major Interventions: Airway management  Raylene Miyamoto. 10/25/2016, 7:34 PM
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
## 2016-10-25 NOTE — Progress Notes (Signed)
PT Cancellation Note  Patient Details Name: [name] MRN: [id number] DOB: 09/22/1935   Cancelled Treatment:    Reason Eval/Treat Not Completed: Patient not medically ready. Pt currently on bedrest. Will await increased activity orders prior to initiating PT eval.    Thelma Comp 10/25/2016, 7:04 AM   Rolinda Roan, PT, DPT Acute Rehabilitation Services Pager: [phone]

## 2016-10-25 NOTE — Progress Notes (Signed)
OT Cancellation Note  Patient Details Name: [name] MRN: [id number] DOB: 1935/04/28   Cancelled Treatment:    Reason Eval/Treat Not Completed: Patient not medically ready.Pt currently on strict bedrest. Will await increased activity orders prior to initiating OT eval.   Almon Register [phone] 10/25/2016, 8:25 AM

## 2016-10-25 NOTE — Progress Notes (Signed)
Wasted 171mls of fentanyl with Lauren RN.

## 2016-10-26 ENCOUNTER — Encounter (HOSPITAL_COMMUNITY): Payer: Self-pay | Admitting: Interventional Radiology

## 2016-10-26 LAB — BASIC METABOLIC PANEL
Anion gap: 5 (ref 5–15)
BUN: 12 mg/dL (ref 6–20)
CO2: 19 mmol/L — ABNORMAL LOW (ref 22–32)
Calcium: 8 mg/dL — ABNORMAL LOW (ref 8.9–10.3)
Chloride: 115 mmol/L — ABNORMAL HIGH (ref 101–111)
Creatinine, Ser: 0.8 mg/dL (ref 0.61–1.24)
GFR calc Af Amer: >60 mL/min (ref >60)
GFR calc non Af Amer: >60 mL/min (ref >60)
Glucose, Bld: 121 mg/dL — ABNORMAL HIGH (ref 65–99)
Potassium: 4 mmol/L (ref 3.5–5.1)
Sodium: 139 mmol/L (ref 135–145)

## 2016-10-26 LAB — CBC
HCT: 32.3 % — ABNORMAL LOW (ref 39.0–52.0)
Hemoglobin: 10.7 g/dL — ABNORMAL LOW (ref 13.0–17.0)
MCH: 32.2 pg (ref 26.0–34.0)
MCHC: 33.1 g/dL (ref 30.0–36.0)
MCV: 97.3 fL (ref 78.0–100.0)
Platelets: 88 10*3/uL — ABNORMAL LOW (ref 150–400)
RBC: 3.32 MIL/uL — ABNORMAL LOW (ref 4.22–5.81)
RDW: 13.8 % (ref 11.5–15.5)
WBC: 8 10*3/uL (ref 4.0–10.5)

## 2016-10-26 LAB — GLUCOSE, CAPILLARY
Glucose-Capillary: 106 mg/dL — ABNORMAL HIGH (ref 65–99)
Glucose-Capillary: 107 mg/dL — ABNORMAL HIGH (ref 65–99)
Glucose-Capillary: 110 mg/dL — ABNORMAL HIGH (ref 65–99)
Glucose-Capillary: 116 mg/dL — ABNORMAL HIGH (ref 65–99)
Glucose-Capillary: 138 mg/dL — ABNORMAL HIGH (ref 65–99)

## 2016-10-26 LAB — PHOSPHORUS
Phosphorus: 2.2 mg/dL — ABNORMAL LOW (ref 2.5–4.6)
Phosphorus: 1.6 mg/dL — ABNORMAL LOW (ref 2.5–4.6)

## 2016-10-26 LAB — MAGNESIUM
Magnesium: 1.9 mg/dL (ref 1.7–2.4)
Magnesium: 1.9 mg/dL (ref 1.7–2.4)

## 2016-10-26 MED ORDER — RESOURCE THICKENUP CLEAR PO POWD
Freq: Once | ORAL | Status: DC
  Filled 2016-10-26: qty 125

## 2016-10-26 MED ORDER — TAMSULOSIN HCL 0.4 MG PO CAPS
0.8000 mg | ORAL_CAPSULE | Freq: Every day | ORAL | Status: DC
  Administered 2016-10-27 – 2016-10-29 (×3): 0.8 mg via ORAL
  Filled 2016-10-26 (×3): qty 2

## 2016-10-26 MED ORDER — INSULIN ASPART 100 UNIT/ML ~~LOC~~ SOLN
0.0000 [IU] | Freq: Three times a day (TID) | SUBCUTANEOUS | Status: DC
  Administered 2016-10-27: 1 [IU] via SUBCUTANEOUS

## 2016-10-26 MED ORDER — AMLODIPINE BESYLATE 5 MG PO TABS
5.0000 mg | ORAL_TABLET | Freq: Every day | ORAL | Status: DC
  Administered 2016-10-26 – 2016-10-27 (×2): 5 mg via ORAL
  Filled 2016-10-26 (×2): qty 1

## 2016-10-26 MED ORDER — ATENOLOL 50 MG PO TABS
50.0000 mg | ORAL_TABLET | Freq: Every day | ORAL | Status: DC
  Administered 2016-10-26 – 2016-10-27 (×2): 50 mg via ORAL
  Filled 2016-10-26 (×2): qty 1

## 2016-10-26 MED ORDER — FINASTERIDE 5 MG PO TABS
5.0000 mg | ORAL_TABLET | Freq: Every day | ORAL | Status: DC
  Administered 2016-10-26 – 2016-10-29 (×4): 5 mg via ORAL
  Filled 2016-10-26 (×4): qty 1
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
## 2016-10-26 NOTE — Evaluation (Signed)
Clinical/Bedside Swallow Evaluation Patient Details  Name: [name] MRN: [id number] Date of Birth: 07-12-1935  [name] Date: 10/26/2016 Time: SLP Start Time (ACUTE ONLY): 0900 SLP Stop Time (ACUTE ONLY): 0915 SLP Time Calculation (min) (ACUTE ONLY): 15 min  Past Medical History:  Past Medical History:  Diagnosis Date  . Bladder stones   . Borderline diabetes   . BPH (benign prostatic hyperplasia)   . Coronary artery disease    cardiologist-  dr Nat Math gerhart NP--- per cath 06-02-2010 non-obstructive cad pLAD 30-40%  . Diverticulosis of colon   . DOE (dyspnea on exertion)   . Gout   . History of adenomatous polyp of colon    2002-- tubular adenoma  . History of aortic insufficiency    severe -- s/p  AVR 08-03-2010  . History of basal cell carcinoma (BCC) excision    10/ 2015  left ear  . History of small bowel obstruction    02/ 2007 mechanical sbo s/p  surgical intervention;  partial sbo 09/ 2011 and 03-20-2011 resolved without surgical intervention  . History of squamous cell carcinoma excision    2014 right foot  . History of urinary retention   . HTN (hypertension)   . Peripheral neuropathy   . S/P aortic valve replacement with prosthetic valve 08/03/2010   tissue valve  . S/P patent foramen ovale closure 08/03/2010   at same time AVR   Past Surgical History:  Past Surgical History:  Procedure Laterality Date  . CARDIAC CATHETERIZATION  06-02-2010  dr Marlou Porch   non-obstructive cad- pLAD 30-40%/  normal LVSF/  severe AI  . CARDIOVASCULAR STRESS TEST  04/12/2016   Low risk nuclear perfusion study w/ no significant reversible ischemia/  normal LV function and wall motion ,  stress ef 60%/  22mm inferior and lateral scooped ST-segment depression w/ exercise (may be repolarization abnormality), exercise capacity was moderately reduced  . CATARACT EXTRACTION W/ INTRAOCULAR LENS  IMPLANT, BILATERAL  02/2010  . CYSTOSCOPY WITH LITHOLAPAXY N/A 06/05/2016   Procedure: CYSTOSCOPY WITH LITHOLAPAXY and fulgarization of bladder neck;  Surgeon: Irine Seal, MD;  Location: [facility];  Service: Urology;  Laterality: N/A;  . EXPLORATORY LAPARTOMY /  CHOLECYSTECTOMY  02/28/2005   for Small  bowel obstruction (mechnical)  . LEFT KNEE ARTHROSCOPY  2006  . RADIOLOGY WITH ANESTHESIA N/A 10/24/2016   Procedure: RADIOLOGY WITH ANESTHESIA;  Surgeon: Luanne Bras, MD;  Location: [facility];  Service: Radiology;  Laterality: N/A;  . RIGHT FOOT SURGERY    . RIGHT MINIATURE ANTERIOR THORACOTOMY FOR AORTIC VALVE REPLACEMENT AND CLOSURE PATENT FORAMEN OVALE  08-03-2010  DR Levada Schilling Magna-ease pericardial tissue valve (69mm)  . TRANSTHORACIC ECHOCARDIOGRAM  05/30/2016  dr skains   moderate  LVH ef 60-65%/  bioprothesis aortic valve present ,normal grandient and no AI /  mild MV calcification , moderate MR /  mild PR/ moderate TR/  PASP 23mmHg/ (RA denisty was identified 04-27-2016 echo) and is seen again today, this is likely a promient eustacian ridge, atrium is normal size   HPI:  81 year old male with hx HTN, DM, AI s/p AVR w/prothestic valve (7/26/201) and PFO repair, BPH suffered CVA 10/17. Was taken to IR for revascularization and was sent to ICU on vent post-operatively.  ETT -10/17-18. MRI - acute infarct right basal ganglia, insula, and scattered tiny areas acute infarct cerebral cortex bilaterally right>left; small hemorrhage right putamen.      Assessment / Plan / Recommendation  Clinical Impression  Pt presents with mild focal CN deficits left CNVII, decreased tongue extension CN XII; dysarthria and mildly hoarse voice s/p one-day intubation.  Pt with consistent cough response after consumption of tspns water, concerning for aspiration; no overt s/s of aspiration after drinking nectar thick liquids.  Adequate oral control/mastication solids; adequate awareness. Recommend initiating a dysphagia 3 diet with nectar-thick liquids for today.   Meds whole in puree.  SLP will follow for safety/diet progression and to evaluate the need for instrumental swallow study.  Pt/RN agree.  SLP Visit Diagnosis: Dysphagia, unspecified (R13.10)    Aspiration Risk  Mild aspiration risk    Diet Recommendation   dysphagia 3, nectar liquids  Medication Administration: Whole meds with puree    Other  Recommendations Oral Care Recommendations: Oral care BID Other Recommendations: Order thickener from pharmacy   Follow up Recommendations Other (comment) (tba)      Frequency and Duration min 2x/week  2 weeks       Prognosis Prognosis for Safe Diet Advancement: Good      Swallow Study   General Date of Onset: 10/24/16 HPI: 81 year old male with hx HTN, DM, AI s/p AVR w/prothestic valve (7/26/201) and PFO repair, BPH suffered CVA 10/17. Was taken to IR for revascularization and was sent to ICU on vent post-operatively.  ETT -10/17-18. MRI - acute infarct right basal ganglia, insula, and scattered tiny areas acute infarct cerebral cortex bilaterally right>left; small hemorrhage right putamen.    Type of Study: Bedside Swallow Evaluation Previous Swallow Assessment: no Diet Prior to this Study: NPO Temperature Spikes Noted: No Respiratory Status: Room air History of Recent Intubation: Yes Length of Intubations (days): 1 days Date extubated: 10/25/16 Behavior/Cognition: Alert;Cooperative;Pleasant mood Oral Cavity Assessment: Within Functional Limits Oral Care Completed by SLP: No Oral Cavity - Dentition: Adequate natural dentition Vision: Functional for self-feeding Self-Feeding Abilities: Needs assist;Able to feed self Patient Positioning: Upright in bed Baseline Vocal Quality: Hoarse Volitional Cough: Strong Volitional Swallow: Able to elicit    Oral/Motor/Sensory Function Overall Oral Motor/Sensory Function: Other (comment) (mild left lower facial asymmetry at rest; reduced tongue ext)   Ice Chips Ice chips: Within functional  limits   Thin Liquid Thin Liquid: Impaired Presentation: Cup Pharyngeal  Phase Impairments: Cough - Immediate    Nectar Thick Nectar Thick Liquid: Within functional limits   Honey Thick Honey Thick Liquid: Not tested   Puree Puree: Within functional limits   Solid   GO   Solid: Within functional limits        Juan Quam Laurice 10/26/2016,9:39 AM  Estill Bamberg L. Tivis Ringer, [place] CCC/SLP Pager [phone]

## 2016-10-26 NOTE — Progress Notes (Signed)
Inpatient Rehabilitation  Met with patient to discuss team's recommendation for IP Rehab.  Shared booklets and answered questions.  Plan to initiate insurance authorization this afternoon in hopes of medical readiness and IP Rehab bed availability on Monday 10/29/16.  Will follow up with patient and team Monday.  Call if questions.   Carmelia Roller., CCC/SLP Admission Coordinator  [place]  Cell [phone]

## 2016-10-26 NOTE — Consult Note (Signed)
Physical Medicine and Rehabilitation Consult Reason for Consult: Decreased functional mobility Referring Physician: Dr. Leonie Man   HPI: [name] is a 81 y.o. right handed male with history of CAD, aortic valve replacement maintain on aspirin, hypertension. Per chart review patient lives with spouse independent prior to admission. 2 level home with bedroom on main level XIV steps to entry. Presented 10/24/2016 with altered mental status aphasia and right gaze deviation. CT/MRI showed acute infarction primarily in the right basal ganglia. Acute infarct also in the insula on the right and scattered tiny areas of acute infarction of the cerebral cortex bilaterally right greater than left. CT angiogram head and neck showed right M1 occlusion extending into both superior and inferior M2 branches. No evidence of craniocervical dissection. Underwent revascularization per interventional radiology. Echocardiogram is pending. Neurology consulted with workup ongoing presently maintained on aspirin therapy. Dysphagia #3 nectar thick liquid diet. Physical therapy evaluation completed with recommendations of physical medicine rehabilitation consult.   Review of Systems  Constitutional: Negative for chills and fever.  HENT: Negative for hearing loss.   Eyes: Negative for blurred vision.  Respiratory: Negative for cough and shortness of breath.   Cardiovascular: Positive for palpitations and leg swelling. Negative for chest pain.  Gastrointestinal: Positive for constipation. Negative for nausea and vomiting.  Genitourinary: Positive for urgency. Negative for dysuria, flank pain and hematuria.  Musculoskeletal: Positive for joint pain and myalgias.  Skin: Negative for rash.  Neurological: Positive for speech change and focal weakness. Negative for seizures.  All other systems reviewed and are negative.  Past Medical History:  Diagnosis Date  . Bladder stones   . Borderline diabetes   . BPH  (benign prostatic hyperplasia)   . Coronary artery disease    cardiologist-  dr Nat Math gerhart NP--- per cath 06-02-2010 non-obstructive cad pLAD 30-40%  . Diverticulosis of colon   . DOE (dyspnea on exertion)   . Gout   . History of adenomatous polyp of colon    2002-- tubular adenoma  . History of aortic insufficiency    severe -- s/p  AVR 08-03-2010  . History of basal cell carcinoma (BCC) excision    10/ 2015  left ear  . History of small bowel obstruction    02/ 2007 mechanical sbo s/p  surgical intervention;  partial sbo 09/ 2011 and 03-20-2011 resolved without surgical intervention  . History of squamous cell carcinoma excision    2014 right foot  . History of urinary retention   . HTN (hypertension)   . Peripheral neuropathy   . S/P aortic valve replacement with prosthetic valve 08/03/2010   tissue valve  . S/P patent foramen ovale closure 08/03/2010   at same time AVR   Past Surgical History:  Procedure Laterality Date  . CARDIAC CATHETERIZATION  06-02-2010  dr Marlou Porch   non-obstructive cad- pLAD 30-40%/  normal LVSF/  severe AI  . CARDIOVASCULAR STRESS TEST  04/12/2016   Low risk nuclear perfusion study w/ no significant reversible ischemia/  normal LV function and wall motion ,  stress ef 60%/  33mm inferior and lateral scooped ST-segment depression w/ exercise (may be repolarization abnormality), exercise capacity was moderately reduced  . CATARACT EXTRACTION W/ INTRAOCULAR LENS  IMPLANT, BILATERAL  02/2010  . CYSTOSCOPY WITH LITHOLAPAXY N/A 06/05/2016   Procedure: CYSTOSCOPY WITH LITHOLAPAXY and fulgarization of bladder neck;  Surgeon: Irine Seal, MD;  Location: [facility];  Service: Urology;  Laterality: N/A;  . EXPLORATORY  LAPARTOMY /  CHOLECYSTECTOMY  02/28/2005   for Small  bowel obstruction (mechnical)  . IR ANGIO EXTRACRAN SEL COM CAROTID INNOMINATE UNI L MOD SED  10/24/2016  . IR ANGIO VERTEBRAL SEL SUBCLAVIAN INNOMINATE BILAT MOD SED   10/24/2016  . IR PERCUTANEOUS ART THROMBECTOMY/INFUSION INTRACRANIAL INC DIAG ANGIO  10/24/2016  . LEFT KNEE ARTHROSCOPY  2006  . RADIOLOGY WITH ANESTHESIA N/A 10/24/2016   Procedure: RADIOLOGY WITH ANESTHESIA;  Surgeon: Luanne Bras, MD;  Location: [facility];  Service: Radiology;  Laterality: N/A;  . RIGHT FOOT SURGERY    . RIGHT MINIATURE ANTERIOR THORACOTOMY FOR AORTIC VALVE REPLACEMENT AND CLOSURE PATENT FORAMEN OVALE  08-03-2010  DR Levada Schilling Magna-ease pericardial tissue valve (84mm)  . TRANSTHORACIC ECHOCARDIOGRAM  05/30/2016  dr skains   moderate  LVH ef 60-65%/  bioprothesis aortic valve present ,normal grandient and no AI /  mild MV calcification , moderate MR /  mild PR/ moderate TR/  PASP 73mmHg/ (RA denisty was identified 04-27-2016 echo) and is seen again today, this is likely a promient eustacian ridge, atrium is normal size   Family History  Problem Relation Age of Onset  . Heart disease Mother   . Brain cancer Father    Social History:  reports that he has never smoked. He has never used smokeless tobacco. He reports that he drinks alcohol. He reports that he does not use drugs. Allergies:  Allergies  Allergen Reactions  . Sulfa Antibiotics Other (See Comments)    Granulocytosis   Medications Prior to Admission  Medication Sig Dispense Refill  . allopurinol (ZYLOPRIM) 300 MG tablet Take 300 mg by mouth daily.      Marland Kitchen amLODipine (NORVASC) 5 MG tablet Take 5 mg by mouth daily.     Marland Kitchen aspirin 81 MG tablet Take 81 mg by mouth daily.     Marland Kitchen atenolol (TENORMIN) 50 MG tablet Take 50 mg by mouth daily.      Marland Kitchen b complex vitamins tablet Take 1 tablet by mouth daily.    . finasteride (PROSCAR) 5 MG tablet Take 5 mg by mouth daily.      . tamsulosin (FLOMAX) 0.4 MG CAPS capsule Take 0.8 mg by mouth daily after breakfast.     . amoxicillin (AMOXIL) 500 MG capsule Take 2,000 mg by mouth as needed (1 hour prior before dental procedures).       Home: Home  Living Family/patient expects to be discharged to:: Private residence Living Arrangements: Spouse/significant other Available Help at Discharge: Family, Available 24 hours/day Type of Home: House Home Access: Stairs to enter [employer] of Steps: 4 Entrance Stairs-Rails: None Home Layout: Two level, Able to live on main level with bedroom/bathroom Bathroom Shower/Tub: Multimedia programmer: [place]: Radio producer - single point  Lives With: Spouse, Son  Functional History: Prior Function Level of Independence: Independent Functional Status:  Mobility: Bed Mobility Overal bed mobility: Needs Assistance Bed Mobility: Supine to Sit Supine to sit: Min assist General bed mobility comments: min HHA to fully elevate trunk, cues for sequence and increased time from flat bed Transfers Overall transfer level: Needs assistance Transfers: Sit to/from Stand Sit to Stand: Min assist General transfer comment: min assist to rise and balance Ambulation/Gait Ambulation/Gait assistance: Min assist, Mod assist Ambulation Distance (Feet): 70 Feet Assistive device: 2 person hand held assist Gait Pattern/deviations: Shuffle, Trunk flexed General Gait Details: pt with shuffling gait with increased posterior left lean with fatigue with pt requiring min assist initially  and progressing to mod assist with fatigue Gait velocity interpretation: Below normal speed for age/gender    ADL:    Cognition: Cognition Overall Cognitive Status: Impaired/Different from baseline Arousal/Alertness: Awake/alert Orientation Level: Oriented to person, Oriented to place, Oriented to situation, Disoriented to time (4332) Attention: Sustained Sustained Attention: Appears intact Memory: Appears intact (for 3 and 5 word retention) Awareness: Appears intact Safety/Judgment: Other (comment) Cognition Arousal/Alertness: Awake/alert Behavior During Therapy: Flat affect Overall Cognitive  Status: Impaired/Different from baseline Area of Impairment: Safety/judgement Safety/Judgement: Decreased awareness of deficits  Blood pressure (!) 141/79, pulse 73, temperature 98.4 F (36.9 C), temperature source Oral, resp. rate 20, height 6' (1.829 m), weight 88.7 kg (195 lb 8.8 oz), SpO2 94 %. Physical Exam  Vitals reviewed. Constitutional: He is oriented to person, place, and time.  HENT:  Head: Normocephalic.  Eyes: EOM are normal.  Neck: Normal range of motion. Neck supple. No thyromegaly present.  Cardiovascular: Normal rate, regular rhythm and normal heart sounds.   Respiratory: Effort normal and breath sounds normal. No respiratory distress.  GI: Soft. Bowel sounds are normal. He exhibits no distension.  Neurological: He is alert and oriented to person, place, and time.  Speech is dysarthric but intelligible with left central 7. mild left inattention. lethargic. He does follow commands. Fair awareness of deficits. RUE 5/5. LUE 4/5 prox to distal with left pronator drift. RLE 4/5 prox to distal. LLE: 3/5 HF, KE and 3/5 ADF/PF. Senses pain and light touch in all 4's.   Skin: Skin is warm and dry.  Psychiatric: He has a normal mood and affect. His behavior is normal. Thought content normal.    Results for orders placed or performed during the hospital encounter of 10/24/16 (from the past 24 hour(s))  Glucose, capillary     Status: Abnormal   Collection Time: 10/25/16  4:05 PM  Result Value Ref Range   Glucose-Capillary 119 (H) 65 - 99 mg/dL  TSH     Status: None   Collection Time: 10/25/16  4:44 PM  Result Value Ref Range   TSH 0.834 0.350 - 4.500 uIU/mL  Glucose, capillary     Status: Abnormal   Collection Time: 10/25/16  8:23 PM  Result Value Ref Range   Glucose-Capillary 130 (H) 65 - 99 mg/dL   Comment 1 Notify RN    Comment 2 Document in Chart   Glucose, capillary     Status: Abnormal   Collection Time: 10/25/16 11:38 PM  Result Value Ref Range   Glucose-Capillary  125 (H) 65 - 99 mg/dL   Comment 1 Notify RN    Comment 2 Document in Chart   Glucose, capillary     Status: Abnormal   Collection Time: 10/26/16  4:02 AM  Result Value Ref Range   Glucose-Capillary 116 (H) 65 - 99 mg/dL  Magnesium     Status: None   Collection Time: 10/26/16  4:17 AM  Result Value Ref Range   Magnesium 1.9 1.7 - 2.4 mg/dL  Phosphorus     Status: Abnormal   Collection Time: 10/26/16  4:17 AM  Result Value Ref Range   Phosphorus 2.2 (L) 2.5 - 4.6 mg/dL  Glucose, capillary     Status: Abnormal   Collection Time: 10/26/16  7:54 AM  Result Value Ref Range   Glucose-Capillary 107 (H) 65 - 99 mg/dL  Basic metabolic panel     Status: Abnormal   Collection Time: 10/26/16  9:38 AM  Result Value Ref Range   Sodium  139 135 - 145 mmol/L   Potassium 4.0 3.5 - 5.1 mmol/L   Chloride 115 (H) 101 - 111 mmol/L   CO2 19 (L) 22 - 32 mmol/L   Glucose, Bld 121 (H) 65 - 99 mg/dL   BUN 12 6 - 20 mg/dL   Creatinine, Ser 0.80 0.61 - 1.24 mg/dL   Calcium 8.0 (L) 8.9 - 10.3 mg/dL   GFR calc non Af Amer >60 >60 mL/min   GFR calc Af Amer >60 >60 mL/min   Anion gap 5 5 - 15  CBC     Status: Abnormal   Collection Time: 10/26/16  9:38 AM  Result Value Ref Range   WBC 8.0 4.0 - 10.5 K/uL   RBC 3.32 (L) 4.22 - 5.81 MIL/uL   Hemoglobin 10.7 (L) 13.0 - 17.0 g/dL   HCT 32.3 (L) 39.0 - 52.0 %   MCV 97.3 78.0 - 100.0 fL   MCH 32.2 26.0 - 34.0 pg   MCHC 33.1 30.0 - 36.0 g/dL   RDW 13.8 11.5 - 15.5 %   Platelets 88 (L) 150 - 400 K/uL  Glucose, capillary     Status: Abnormal   Collection Time: 10/26/16 12:30 PM  Result Value Ref Range   Glucose-Capillary 138 (H) 65 - 99 mg/dL   Ct Angio Head W Or Wo Contrast  Result Date: 10/24/2016 CLINICAL DATA:  Acute onset of LEFT-sided weakness. EXAM: CT ANGIOGRAPHY HEAD AND NECK TECHNIQUE: Multidetector CT imaging of the head and neck was performed using the standard protocol during bolus administration of intravenous contrast. Multiplanar CT image  reconstructions and MIPs were obtained to evaluate the vascular anatomy. Carotid stenosis measurements (when applicable) are obtained utilizing NASCET criteria, using the distal internal carotid diameter as the denominator. CONTRAST:  50 mL Isovue 370. COMPARISON:  CT head earlier today. CT perfusion reported separately. FINDINGS: CTA NECK Aortic arch: Standard branching. Imaged portion shows no evidence of aneurysm or dissection. No significant stenosis of the major arch vessel origins. Right carotid system: Unremarkable. No evidence of dissection, stenosis (50% or greater) or occlusion. Left carotid system: Unremarkable. No evidence of dissection, stenosis (50% or greater) or occlusion. Vertebral arteries: Codominant. No evidence of dissection, stenosis (50% or greater) or occlusion. Nonvascular soft tissues: Lung apices clear. No neck masses. Cervical spondylosis. CTA HEAD Anterior circulation: Calcific atheromatous disease affecting the cavernous and supraclinoid internal carotid arteries without skull base stenosis. LEFT anterior circulation normal. RIGHT anterior circulation demonstrates abrupt occlusion of the mid to distal M1, and both M2 branches, of the RIGHT middle cerebral artery. There is a paucity of collaterals filling the M3 branches of the RIGHT hemisphere. Posterior circulation: Basilar artery widely patent. Both vertebrals contribute to its formation. No cerebellar branch or PCA disease of significance. Venous sinuses: As permitted by contrast timing, patent. Anatomic variants: None of significance. Delayed phase:   No abnormal intracranial enhancement. Review of the MIP images confirms the above findings IMPRESSION: RIGHT M1 occlusion extending into both superior and inferior M2 branches. Significant diminution of flow into the RIGHT hemisphere, separately quantitated and described on CT perfusion study. No extracranial stenosis of significance. No evidence of craniocervical dissection.  Electronically Signed   By: Staci Righter M.D.   On: 10/24/2016 15:57   Ct Head Wo Contrast  Result Date: 10/24/2016 CLINICAL DATA:  Stroke follow-up. EXAM: CT HEAD WITHOUT CONTRAST TECHNIQUE: Contiguous axial images were obtained from the base of the skull through the vertex without intravenous contrast. COMPARISON:  Earlier today FINDINGS:  Brain: Infarcts seen along the right insula and possibly posterior putamen. Negative for postprocedural hemorrhage. Small right cerebellar infarct. Vascular: Intravascular contrast from recent procedure. Vessel density is symmetric. Skull: No acute finding Sinuses/Orbits: Mucosal thickening in the paranasal sinuses. IMPRESSION: Small volume acute infarct seen in the right insula and possibly putamen. No postprocedural hemorrhage. Electronically Signed   By: Monte Fantasia M.D.   On: 10/24/2016 16:00   Ct Angio Neck W Or Wo Contrast  Result Date: 10/24/2016 CLINICAL DATA:  Acute onset of LEFT-sided weakness. EXAM: CT ANGIOGRAPHY HEAD AND NECK TECHNIQUE: Multidetector CT imaging of the head and neck was performed using the standard protocol during bolus administration of intravenous contrast. Multiplanar CT image reconstructions and MIPs were obtained to evaluate the vascular anatomy. Carotid stenosis measurements (when applicable) are obtained utilizing NASCET criteria, using the distal internal carotid diameter as the denominator. CONTRAST:  50 mL Isovue 370. COMPARISON:  CT head earlier today. CT perfusion reported separately. FINDINGS: CTA NECK Aortic arch: Standard branching. Imaged portion shows no evidence of aneurysm or dissection. No significant stenosis of the major arch vessel origins. Right carotid system: Unremarkable. No evidence of dissection, stenosis (50% or greater) or occlusion. Left carotid system: Unremarkable. No evidence of dissection, stenosis (50% or greater) or occlusion. Vertebral arteries: Codominant. No evidence of dissection, stenosis (50%  or greater) or occlusion. Nonvascular soft tissues: Lung apices clear. No neck masses. Cervical spondylosis. CTA HEAD Anterior circulation: Calcific atheromatous disease affecting the cavernous and supraclinoid internal carotid arteries without skull base stenosis. LEFT anterior circulation normal. RIGHT anterior circulation demonstrates abrupt occlusion of the mid to distal M1, and both M2 branches, of the RIGHT middle cerebral artery. There is a paucity of collaterals filling the M3 branches of the RIGHT hemisphere. Posterior circulation: Basilar artery widely patent. Both vertebrals contribute to its formation. No cerebellar branch or PCA disease of significance. Venous sinuses: As permitted by contrast timing, patent. Anatomic variants: None of significance. Delayed phase:   No abnormal intracranial enhancement. Review of the MIP images confirms the above findings IMPRESSION: RIGHT M1 occlusion extending into both superior and inferior M2 branches. Significant diminution of flow into the RIGHT hemisphere, separately quantitated and described on CT perfusion study. No extracranial stenosis of significance. No evidence of craniocervical dissection. Electronically Signed   By: Staci Righter M.D.   On: 10/24/2016 15:57   [name]  Result Date: 10/25/2016 CLINICAL DATA:  Stroke.  Post endovascular clot or cerebral and tPA. EXAM: MRI HEAD WITHOUT CONTRAST TECHNIQUE: Multiplanar, multiecho pulse sequences of the brain and surrounding structures were obtained without intravenous contrast. COMPARISON:  CT head 10/24/2016. FINDINGS: Brain: Acute infarct in the right basal ganglia involving the putamen and body and tail of caudate. Acute infarct in the right insula. Small areas of acute infarct in the right frontal and parietal cortex and in the left occipital cortex. Small acute infarct right occipital cortex. Ventricle size normal. Bilateral subdural hygromas unchanged from yesterday. No prior baseline  study. These may be chronic. Low negative for hydrocephalus. Tiny focus of hemorrhage in the right parietal white matter likely chronic. Small amount of hemorrhage in the right putamen. No shift of the midline structures Vascular: Normal arterial flow void Skull and upper cervical spine: Negative Sinuses/Orbits: Mild mucosal edema paranasal sinuses. Bilateral lens replacement Other: None IMPRESSION: Acute infarct primarily in the right basal ganglia. Acute infarct also in the insula on the right and scattered tiny areas of acute infarct in the cerebral cortex  bilaterally right greater than left. Small amount of hemorrhage in the right putamen. No shift of the midline structures. Electronically Signed   By: Franchot Gallo M.D.   On: 10/25/2016 11:05   Ct Cerebral Perfusion W Contrast  Result Date: 10/24/2016 CLINICAL DATA:  LEFT-sided weakness. EXAM: CT PERFUSION BRAIN TECHNIQUE: Multiphase CT imaging of the brain was performed following IV bolus contrast injection. Subsequent parametric perfusion maps were calculated using RAPID software. CONTRAST:  40 mL Isovue 370 COMPARISON:  CTA head neck reported separately. FINDINGS: CT Brain Perfusion Findings: CBF (<30%) Volume: 6.52mL Perfusion (Tmax>6.0s) volume: 334.32mL Mismatch Volume: 332mL Infarction Location:RIGHT hemisphere, nearly the entire RIGHT MCA territory. IMPRESSION: Large area of potentially reversible ischemia, significant mismatch volume of 328 mL, nearly the entire RIGHT hemisphere MCA volume, related to a proximal RIGHT MCA emergent large vessel occlusion. Stroke neurologist and Neurointerventional team aware. Electronically Signed   By: Staci Righter M.D.   On: 10/24/2016 16:00   Portable Chest Xray  Result Date: 10/25/2016 CLINICAL DATA:  Stroke. EXAM: PORTABLE CHEST 1 VIEW COMPARISON:  10/24/2016 FINDINGS: Endotracheal tube remains in satisfactory position and unchanged. Gastric tube coiled in the stomach. Cardiac enlargement with aortic  valve replacement. Negative for heart failure. Mild bibasilar atelectasis unchanged. IMPRESSION: No significant change from the prior study. Mild bibasilar atelectasis. Endotracheal tube in satisfactory position. Electronically Signed   By: Franchot Gallo M.D.   On: 10/25/2016 07:16   Portable Chest Xray  Result Date: 10/24/2016 CLINICAL DATA:  Stroke EXAM: PORTABLE CHEST 1 VIEW COMPARISON:  10/02/2010 FINDINGS: Endotracheal tube in good position.  NG tube enters the stomach. Aortic valve replacement. Bibasilar atelectasis left greater than right. Negative for edema IMPRESSION: Endotracheal tube in satisfactory position. Bibasilar atelectasis left greater than right Electronically Signed   By: Franchot Gallo M.D.   On: 10/24/2016 17:43   Dg Abd Portable 1v  Result Date: 10/24/2016 CLINICAL DATA:  Stroke EXAM: PORTABLE ABDOMEN - 1 VIEW COMPARISON:  None. FINDINGS: NG tube coiled in the stomach. Nonobstructive bowel gas pattern. Urinary tract excretion of contrast due to recent angiography. IMPRESSION: NG tube in the stomach.  Normal bowel gas pattern. Electronically Signed   By: Franchot Gallo M.D.   On: 10/24/2016 17:44   Ir Percutaneous Art Thrombectomy/infusion Intracranial Inc Diag Angio  Result Date: 10/26/2016 INDICATION: Acute onset of right gaze deviation. Left sided hemiparesis. Abnormal CT scan of the brain with right middle cerebral artery hyperdense sign and occluded right middle cerebral artery on CT angiogram. EXAM: 1. EMERGENT LARGE VESSEL OCCLUSION THROMBOLYSIS (anterior CIRCULATION) COMPARISON:  CT angiogram of the head and neck of 10/24/2016. MEDICATIONS: Ancef 2 g IV was administered within 1 hour of the procedure. ANESTHESIA/SEDATION: General anesthesia. CONTRAST:  Isovue 300 85 cc. FLUOROSCOPY TIME:  Fluoroscopy Time: 35 minutes 42 seconds (1764 mGy). COMPLICATIONS: None immediate. TECHNIQUE: Following a full explanation of the procedure along with the potential associated  complications, an informed witnessed consent was obtained. The risks of intracranial hemorrhage of 10%, worsening neurological deficit, ventilator dependency, death and inability to revascularize were all reviewed in detail with the patient's spouse and son. The patient was then put under general anesthesia by the Department of Anesthesiology at [facility]. The right groin was prepped and draped in the usual sterile fashion. Thereafter using modified Seldinger technique, transfemoral access into the right common femoral artery was obtained without difficulty. Over a 0.035 inch guidewire a 5 French Pinnacle sheath was inserted. Through this, and also over a 0.035  inch guidewire a 5 [place] JB 1 catheter was advanced to the aortic arch region and selectively positioned in the right subclavian artery, the right common carotid artery, the left vertebral artery and the left common carotid artery. FINDINGS: The right subclavian arteriogram demonstrates the origin of the right vertebral artery to be normal. The vessel is seen to opacify to the cranial skull base to the level of the right posterior-inferior cerebellar artery. A faint opacification is seen distal to this into the right vertebrobasilar junction. The right common carotid arteriogram demonstrates the right external carotid artery and its major branches to be widely patent. The right internal carotid artery at the bulb to the cranial skull base opacifies normally. The petrous, the cavernous and the supraclinoid segments are widely patent. The right middle cerebral artery demonstrates complete occlusion in the proximal right M1 segment. The right anterior cerebral artery is seen to opacify distally into the capillary and venous phases. Cross-filling via the anterior communicating artery of the left anterior cerebral artery A2 segment and distally is noted. Also demonstrated is a right posterior communicating artery opacifying the right posterior cerebral  artery distribution. A prominent right anterior choroidal artery, a developmental variation is also noted. The left common carotid arteriogram demonstrates the left external carotid artery and its major branches to be widely patent. The left internal carotid artery at the bulb to the cranial skull base opacifies normally. The petrous, the cavernous and the supraclinoid segments are widely patent. A left posterior communicating artery is seen opacifying the left posterior cerebral artery distribution. The left middle cerebral artery and the left anterior cerebral artery opacify into the capillary and venous phases. Prompt cross opacification of the right anterior cerebral A2 segment is noted via the anterior communicating artery. The origin of left vertebral artery demonstrates mild narrowing. The vessel, otherwise, opacifies to the cranial skull base. There is normal opacification of left vertebrobasilar junction. Normal opacification is seen of the left posterior-inferior cerebellar artery and the left vertebrobasilar junction distal to this. The opacified portion of the basilar artery, the left posterior cerebral artery, the superior cerebellar arteries and the anterior-inferior cerebellar artery is grossly normal into the delayed arterial phase. Non-opacified blood is seen in the basilar artery from the contralateral vertebral artery. PROCEDURE: ENDOVASCULAR REVASCULARIZATION OF OCCLUDED RIGHT MIDDLE CEREBELLAR ARTERY M1 SEGMENT PROXIMALLY WITH MECHANICAL THROMBECTOMY. The diagnostic JB 1 catheter in the right common carotid artery was exchanged over a 0.035 inch 300 cm Rosen exchange guidewire for an 8 French 55 cm Brite tip neurovascular sheath using biplane roadmap technique and constant fluoroscopic guidance. Good aspiration obtained from the side port of the neurovascular sheath. This was then connected to continuous heparinized saline infusion. Over the [facility] exchange guidewire, a 95 cm 8 [place] FlowGate  guide catheter which had been prepped with 50% contrast and 50% heparinized saline infusion was advanced and positioned just proximal to the right common carotid bifurcation. The guidewire was removed. Good aspiration obtained from the hub of the 8 French balloon FlowGate guide catheter. A gentle contrast injection demonstrated no evidence of spasms, dissections or of intraluminal filling defects. Over a 0.035 inch Roadrunner guidewire, the 8 [place] FlowGate guide catheter was then advanced to the junction of the distal and the proximal 1/3 of the right internal carotid artery. The guidewire was removed. Good aspiration obtained from the hub of the 8 [place] FlowGate guide catheter. A gentle contrast injection demonstrated no evidence of spasms, dissections or of intraluminal filling defects. A combination of  5 French 115 cm Catalyst guide catheter inside of which was a Kellogg 021 microcatheter was advanced over a 0.0141 Softip Synchro micro guidewire to the distal end of the 8 [place] FlowGate guide catheter. With the micro guidewire leading with a J-tip configuration, the combination was navigated without difficulty to the supraclinoid right ICA. Using a torque device, access was obtained into the occluded right middle cerebral artery into the M2 M3 region of the superior division. The microcatheter was then advanced into the M2 M3 region of the superior division. The guidewire was removed. Good aspiration obtained from the hub of the microcatheter. A gentle contrast injection demonstrated slow antegrade flow into the distal circulation. An Embotrap 5 mm x 33 mm retrieval device was then advanced in a coaxial manner and with constant heparinized saline infusion using biplane roadmap technique and constant fluoroscopic guidance to the distal end of the Trevo ProVue 021 microcatheter. The O ring on the delivery microcatheter was then loosened. The distal and the proximal retrieval markers were then aligned  appropriately. Thereafter with slight forward traction with the right hand on the delivery micro guidewire, with the left hand the delivery microcatheter was retrieved unsheathing the distal and then the proximal portion of the retrieval device. A control arteriogram performed through the 5 [place] Catalyst guide catheter demonstrated continued occlusion of the right middle cerebral artery M1 segment. The proximal portion of the retrieval device was then captured into the microcatheter. The balloon in the right internal carotid artery of the [facility] guide catheter was then inflated for proximal flow arrest. Thereafter, the combination of the retrieval device, the microcatheter and the 5 [place] Catalyst guide catheter was retrieved as aspiration was applied with a 60 mL syringe at the hub of the 8 [place] FlowGate guide catheter, and a 20 mL syringe at the hub of the 5 [place] Catalyst guide catheter. The triaxial combination was gently retrieved and removed as aspiration was continued with a 60 mL syringe. The retrieval device had a few specks of clot entangled within its interstices. The balloon was then deflated in the proximal right internal carotid artery. Free back bleed of blood was noted at the hub of the [employer] [place]. A control arteriogram performed through the [facility] guide catheter in the right internal carotid artery demonstrated continued occlusion of the right middle cerebral artery M1 segment proximally. A second attempt was then made this time using a Catalyst 5 [place] guide catheter of length 132 cm inside of which was a Trevo ProVue microcatheter again over a 0.014 inch Softip Synchro micro guidewire. As mentioned above this triaxial combination was advanced to the right internal carotid supraclinoid segment. The 5 [place] FlowGate guide catheter was then loaded into the supraclinoid right ICA. Access was obtained to the occluded right middle cerebral artery with the micro guidewire followed by the  microcatheter this time into the inferior division of the right middle cerebral artery M2 M3 region. The microcatheter was advanced to this region. The guidewire was removed. Good aspiration obtained from the hub of the microcatheter. A gentle contrast injection demonstrated antegrade flow at the tip of the microcatheter. A 4 mm x 40 mm Solitaire FR retrieval device was then advanced again with constant heparinized saline infusion to the distal end of the microcatheter. Again the O ring on the delivery microcatheter was loosened. The distal and the proximal markers of the retrieval device were then deployed as mentioned above. A control arteriogram performed through the 5 [place] Catalyst guide catheter  in the right internal carotid artery supraclinoid segment demonstrated a TICI 2a reperfusion. With the balloon inflated in the right internal carotid artery for proximal flow arrest, and constant aspiration being applied at the hub of the 8 [place] FlowGate guide catheter with a 60 mL syringe, the combination of the retrieval device, the microcatheter and the 5 [place] Catalyst guide catheter was gently retrieved and removed. More clot was noted on the interstices of the retrieval device. Aspiration was continued as the balloon was deflated in the right internal carotid artery. There continued to be poor aspiration. The FlowGate guide catheter was gently retrieved externally as constant aspiration was applied at the hub of the 8 [place] catheter. Clots were noted in the hub of the [place], and also in the aspirate as well as in the [facility] guide catheter. Control arteriogram performed through the diagnostic catheter position in the right common carotid artery demonstrated complete angiographic revascularization of the right middle cerebral artery achieving a TICI 3 reperfusion. No evidence of extravasation of contrast or mass-effect of the major vessel was noted intracranially. There continued to be patency of the  anterior cerebral arteries, and the right posterior cerebral artery and the right anterior choroidal artery. The 8 French sheath was then retrieved into the abdominal aorta and exchanged over a J-tip guidewire for a 9 [place] Pinnacle sheath. This in turn was connected to continuous heparinized saline infusion. Throughout the procedure, the patient's hemodynamic status and neurological status remained stable. IMPRESSION: Status post endovascular complete revascularization of the occluded right middle cerebral artery with 1 pass using the Embotrap 5 mm x 33 mm retrieval device, and 1 pass with the 4 mm x 40 mm Solitaire FR retrieval device achieving a TICI 3 reperfusion. Groin puncture time 01:53 p.m. TICI 3 reperfusion time 3 p.m. PLAN: Patient transported to the CT scan suite for postprocedural CT scan of the brain. Electronically Signed   By: Luanne Bras M.D.   On: 10/25/2016 20:04   Ir Angio Extracran Sel Com Carotid Innominate Uni L Mod Sed  Result Date: 10/26/2016 INDICATION: Acute onset of right gaze deviation. Left sided hemiparesis. Abnormal CT scan of the brain with right middle cerebral artery hyperdense sign and occluded right middle cerebral artery on CT angiogram. EXAM: 1. EMERGENT LARGE VESSEL OCCLUSION THROMBOLYSIS (anterior CIRCULATION) COMPARISON:  CT angiogram of the head and neck of 10/24/2016. MEDICATIONS: Ancef 2 g IV was administered within 1 hour of the procedure. ANESTHESIA/SEDATION: General anesthesia. CONTRAST:  Isovue 300 85 cc. FLUOROSCOPY TIME:  Fluoroscopy Time: 35 minutes 42 seconds (1764 mGy). COMPLICATIONS: None immediate. TECHNIQUE: Following a full explanation of the procedure along with the potential associated complications, an informed witnessed consent was obtained. The risks of intracranial hemorrhage of 10%, worsening neurological deficit, ventilator dependency, death and inability to revascularize were all reviewed in detail with the patient's spouse and son. The  patient was then put under general anesthesia by the Department of Anesthesiology at [facility]. The right groin was prepped and draped in the usual sterile fashion. Thereafter using modified Seldinger technique, transfemoral access into the right common femoral artery was obtained without difficulty. Over a 0.035 inch guidewire a 5 French Pinnacle sheath was inserted. Through this, and also over a 0.035 inch guidewire a 5 [place] JB 1 catheter was advanced to the aortic arch region and selectively positioned in the right subclavian artery, the right common carotid artery, the left vertebral artery and the left common carotid artery. FINDINGS: The right subclavian  arteriogram demonstrates the origin of the right vertebral artery to be normal. The vessel is seen to opacify to the cranial skull base to the level of the right posterior-inferior cerebellar artery. A faint opacification is seen distal to this into the right vertebrobasilar junction. The right common carotid arteriogram demonstrates the right external carotid artery and its major branches to be widely patent. The right internal carotid artery at the bulb to the cranial skull base opacifies normally. The petrous, the cavernous and the supraclinoid segments are widely patent. The right middle cerebral artery demonstrates complete occlusion in the proximal right M1 segment. The right anterior cerebral artery is seen to opacify distally into the capillary and venous phases. Cross-filling via the anterior communicating artery of the left anterior cerebral artery A2 segment and distally is noted. Also demonstrated is a right posterior communicating artery opacifying the right posterior cerebral artery distribution. A prominent right anterior choroidal artery, a developmental variation is also noted. The left common carotid arteriogram demonstrates the left external carotid artery and its major branches to be widely patent. The left internal carotid  artery at the bulb to the cranial skull base opacifies normally. The petrous, the cavernous and the supraclinoid segments are widely patent. A left posterior communicating artery is seen opacifying the left posterior cerebral artery distribution. The left middle cerebral artery and the left anterior cerebral artery opacify into the capillary and venous phases. Prompt cross opacification of the right anterior cerebral A2 segment is noted via the anterior communicating artery. The origin of left vertebral artery demonstrates mild narrowing. The vessel, otherwise, opacifies to the cranial skull base. There is normal opacification of left vertebrobasilar junction. Normal opacification is seen of the left posterior-inferior cerebellar artery and the left vertebrobasilar junction distal to this. The opacified portion of the basilar artery, the left posterior cerebral artery, the superior cerebellar arteries and the anterior-inferior cerebellar artery is grossly normal into the delayed arterial phase. Non-opacified blood is seen in the basilar artery from the contralateral vertebral artery. PROCEDURE: ENDOVASCULAR REVASCULARIZATION OF OCCLUDED RIGHT MIDDLE CEREBELLAR ARTERY M1 SEGMENT PROXIMALLY WITH MECHANICAL THROMBECTOMY. The diagnostic JB 1 catheter in the right common carotid artery was exchanged over a 0.035 inch 300 cm Rosen exchange guidewire for an 8 French 55 cm Brite tip neurovascular sheath using biplane roadmap technique and constant fluoroscopic guidance. Good aspiration obtained from the side port of the neurovascular sheath. This was then connected to continuous heparinized saline infusion. Over the [facility] exchange guidewire, a 95 cm 8 [place] FlowGate guide catheter which had been prepped with 50% contrast and 50% heparinized saline infusion was advanced and positioned just proximal to the right common carotid bifurcation. The guidewire was removed. Good aspiration obtained from the hub of the 8 French  balloon FlowGate guide catheter. A gentle contrast injection demonstrated no evidence of spasms, dissections or of intraluminal filling defects. Over a 0.035 inch Roadrunner guidewire, the 8 [place] FlowGate guide catheter was then advanced to the junction of the distal and the proximal 1/3 of the right internal carotid artery. The guidewire was removed. Good aspiration obtained from the hub of the 8 [place] FlowGate guide catheter. A gentle contrast injection demonstrated no evidence of spasms, dissections or of intraluminal filling defects. A combination of 5 French 115 cm Catalyst guide catheter inside of which was a Kellogg 021 microcatheter was advanced over a 0.0141 Softip Synchro micro guidewire to the distal end of the 8 [place] FlowGate guide catheter. With the micro guidewire leading with  a J-tip configuration, the combination was navigated without difficulty to the supraclinoid right ICA. Using a torque device, access was obtained into the occluded right middle cerebral artery into the M2 M3 region of the superior division. The microcatheter was then advanced into the M2 M3 region of the superior division. The guidewire was removed. Good aspiration obtained from the hub of the microcatheter. A gentle contrast injection demonstrated slow antegrade flow into the distal circulation. An Embotrap 5 mm x 33 mm retrieval device was then advanced in a coaxial manner and with constant heparinized saline infusion using biplane roadmap technique and constant fluoroscopic guidance to the distal end of the Trevo ProVue 021 microcatheter. The O ring on the delivery microcatheter was then loosened. The distal and the proximal retrieval markers were then aligned appropriately. Thereafter with slight forward traction with the right hand on the delivery micro guidewire, with the left hand the delivery microcatheter was retrieved unsheathing the distal and then the proximal portion of the retrieval device. A control  arteriogram performed through the 5 [place] Catalyst guide catheter demonstrated continued occlusion of the right middle cerebral artery M1 segment. The proximal portion of the retrieval device was then captured into the microcatheter. The balloon in the right internal carotid artery of the [facility] guide catheter was then inflated for proximal flow arrest. Thereafter, the combination of the retrieval device, the microcatheter and the 5 [place] Catalyst guide catheter was retrieved as aspiration was applied with a 60 mL syringe at the hub of the 8 [place] FlowGate guide catheter, and a 20 mL syringe at the hub of the 5 [place] Catalyst guide catheter. The triaxial combination was gently retrieved and removed as aspiration was continued with a 60 mL syringe. The retrieval device had a few specks of clot entangled within its interstices. The balloon was then deflated in the proximal right internal carotid artery. Free back bleed of blood was noted at the hub of the [employer] [place]. A control arteriogram performed through the [facility] guide catheter in the right internal carotid artery demonstrated continued occlusion of the right middle cerebral artery M1 segment proximally. A second attempt was then made this time using a Catalyst 5 [place] guide catheter of length 132 cm inside of which was a Trevo ProVue microcatheter again over a 0.014 inch Softip Synchro micro guidewire. As mentioned above this triaxial combination was advanced to the right internal carotid supraclinoid segment. The 5 [place] FlowGate guide catheter was then loaded into the supraclinoid right ICA. Access was obtained to the occluded right middle cerebral artery with the micro guidewire followed by the microcatheter this time into the inferior division of the right middle cerebral artery M2 M3 region. The microcatheter was advanced to this region. The guidewire was removed. Good aspiration obtained from the hub of the microcatheter. A gentle contrast  injection demonstrated antegrade flow at the tip of the microcatheter. A 4 mm x 40 mm Solitaire FR retrieval device was then advanced again with constant heparinized saline infusion to the distal end of the microcatheter. Again the O ring on the delivery microcatheter was loosened. The distal and the proximal markers of the retrieval device were then deployed as mentioned above. A control arteriogram performed through the 5 [place] Catalyst guide catheter in the right internal carotid artery supraclinoid segment demonstrated a TICI 2a reperfusion. With the balloon inflated in the right internal carotid artery for proximal flow arrest, and constant aspiration being applied at the hub of the 8 [place] FlowGate guide catheter  with a 60 mL syringe, the combination of the retrieval device, the microcatheter and the 5 [place] Catalyst guide catheter was gently retrieved and removed. More clot was noted on the interstices of the retrieval device. Aspiration was continued as the balloon was deflated in the right internal carotid artery. There continued to be poor aspiration. The FlowGate guide catheter was gently retrieved externally as constant aspiration was applied at the hub of the 8 [place] catheter. Clots were noted in the hub of the [place], and also in the aspirate as well as in the [facility] guide catheter. Control arteriogram performed through the diagnostic catheter position in the right common carotid artery demonstrated complete angiographic revascularization of the right middle cerebral artery achieving a TICI 3 reperfusion. No evidence of extravasation of contrast or mass-effect of the major vessel was noted intracranially. There continued to be patency of the anterior cerebral arteries, and the right posterior cerebral artery and the right anterior choroidal artery. The 8 French sheath was then retrieved into the abdominal aorta and exchanged over a J-tip guidewire for a 9 [place] Pinnacle sheath. This in turn  was connected to continuous heparinized saline infusion. Throughout the procedure, the patient's hemodynamic status and neurological status remained stable. IMPRESSION: Status post endovascular complete revascularization of the occluded right middle cerebral artery with 1 pass using the Embotrap 5 mm x 33 mm retrieval device, and 1 pass with the 4 mm x 40 mm Solitaire FR retrieval device achieving a TICI 3 reperfusion. Groin puncture time 01:53 p.m. TICI 3 reperfusion time 3 p.m. PLAN: Patient transported to the CT scan suite for postprocedural CT scan of the brain. Electronically Signed   By: Luanne Bras M.D.   On: 10/25/2016 20:04   Ir Angio Vertebral Sel Subclavian Innominate Bilat Mod Sed  Result Date: 10/26/2016 INDICATION: Acute onset of right gaze deviation. Left sided hemiparesis. Abnormal CT scan of the brain with right middle cerebral artery hyperdense sign and occluded right middle cerebral artery on CT angiogram. EXAM: 1. EMERGENT LARGE VESSEL OCCLUSION THROMBOLYSIS (anterior CIRCULATION) COMPARISON:  CT angiogram of the head and neck of 10/24/2016. MEDICATIONS: Ancef 2 g IV was administered within 1 hour of the procedure. ANESTHESIA/SEDATION: General anesthesia. CONTRAST:  Isovue 300 85 cc. FLUOROSCOPY TIME:  Fluoroscopy Time: 35 minutes 42 seconds (1764 mGy). COMPLICATIONS: None immediate. TECHNIQUE: Following a full explanation of the procedure along with the potential associated complications, an informed witnessed consent was obtained. The risks of intracranial hemorrhage of 10%, worsening neurological deficit, ventilator dependency, death and inability to revascularize were all reviewed in detail with the patient's spouse and son. The patient was then put under general anesthesia by the Department of Anesthesiology at [facility]. The right groin was prepped and draped in the usual sterile fashion. Thereafter using modified Seldinger technique, transfemoral access into the right  common femoral artery was obtained without difficulty. Over a 0.035 inch guidewire a 5 [place] Pinnacle sheath was inserted. Through this, and also over a 0.035 inch guidewire a 5 [place] JB 1 catheter was advanced to the aortic arch region and selectively positioned in the right subclavian artery, the right common carotid artery, the left vertebral artery and the left common carotid artery. FINDINGS: The right subclavian arteriogram demonstrates the origin of the right vertebral artery to be normal. The vessel is seen to opacify to the cranial skull base to the level of the right posterior-inferior cerebellar artery. A faint opacification is seen distal to this into the right vertebrobasilar  junction. The right common carotid arteriogram demonstrates the right external carotid artery and its major branches to be widely patent. The right internal carotid artery at the bulb to the cranial skull base opacifies normally. The petrous, the cavernous and the supraclinoid segments are widely patent. The right middle cerebral artery demonstrates complete occlusion in the proximal right M1 segment. The right anterior cerebral artery is seen to opacify distally into the capillary and venous phases. Cross-filling via the anterior communicating artery of the left anterior cerebral artery A2 segment and distally is noted. Also demonstrated is a right posterior communicating artery opacifying the right posterior cerebral artery distribution. A prominent right anterior choroidal artery, a developmental variation is also noted. The left common carotid arteriogram demonstrates the left external carotid artery and its major branches to be widely patent. The left internal carotid artery at the bulb to the cranial skull base opacifies normally. The petrous, the cavernous and the supraclinoid segments are widely patent. A left posterior communicating artery is seen opacifying the left posterior cerebral artery distribution. The left  middle cerebral artery and the left anterior cerebral artery opacify into the capillary and venous phases. Prompt cross opacification of the right anterior cerebral A2 segment is noted via the anterior communicating artery. The origin of left vertebral artery demonstrates mild narrowing. The vessel, otherwise, opacifies to the cranial skull base. There is normal opacification of left vertebrobasilar junction. Normal opacification is seen of the left posterior-inferior cerebellar artery and the left vertebrobasilar junction distal to this. The opacified portion of the basilar artery, the left posterior cerebral artery, the superior cerebellar arteries and the anterior-inferior cerebellar artery is grossly normal into the delayed arterial phase. Non-opacified blood is seen in the basilar artery from the contralateral vertebral artery. PROCEDURE: ENDOVASCULAR REVASCULARIZATION OF OCCLUDED RIGHT MIDDLE CEREBELLAR ARTERY M1 SEGMENT PROXIMALLY WITH MECHANICAL THROMBECTOMY. The diagnostic JB 1 catheter in the right common carotid artery was exchanged over a 0.035 inch 300 cm Rosen exchange guidewire for an 8 [place] 55 cm Brite tip neurovascular sheath using biplane roadmap technique and constant fluoroscopic guidance. Good aspiration obtained from the side port of the neurovascular sheath. This was then connected to continuous heparinized saline infusion. Over the [facility] exchange guidewire, a 95 cm 8 [place] FlowGate guide catheter which had been prepped with 50% contrast and 50% heparinized saline infusion was advanced and positioned just proximal to the right common carotid bifurcation. The guidewire was removed. Good aspiration obtained from the hub of the 8 French balloon FlowGate guide catheter. A gentle contrast injection demonstrated no evidence of spasms, dissections or of intraluminal filling defects. Over a 0.035 inch Roadrunner guidewire, the 8 [place] FlowGate guide catheter was then advanced to the junction of  the distal and the proximal 1/3 of the right internal carotid artery. The guidewire was removed. Good aspiration obtained from the hub of the 8 [place] FlowGate guide catheter. A gentle contrast injection demonstrated no evidence of spasms, dissections or of intraluminal filling defects. A combination of 5 French 115 cm Catalyst guide catheter inside of which was a Kellogg 021 microcatheter was advanced over a 0.0141 Softip Synchro micro guidewire to the distal end of the 8 [place] FlowGate guide catheter. With the micro guidewire leading with a J-tip configuration, the combination was navigated without difficulty to the supraclinoid right ICA. Using a torque device, access was obtained into the occluded right middle cerebral artery into the M2 M3 region of the superior division. The microcatheter was then advanced into the  M2 M3 region of the superior division. The guidewire was removed. Good aspiration obtained from the hub of the microcatheter. A gentle contrast injection demonstrated slow antegrade flow into the distal circulation. An Embotrap 5 mm x 33 mm retrieval device was then advanced in a coaxial manner and with constant heparinized saline infusion using biplane roadmap technique and constant fluoroscopic guidance to the distal end of the Trevo ProVue 021 microcatheter. The O ring on the delivery microcatheter was then loosened. The distal and the proximal retrieval markers were then aligned appropriately. Thereafter with slight forward traction with the right hand on the delivery micro guidewire, with the left hand the delivery microcatheter was retrieved unsheathing the distal and then the proximal portion of the retrieval device. A control arteriogram performed through the 5 [place] Catalyst guide catheter demonstrated continued occlusion of the right middle cerebral artery M1 segment. The proximal portion of the retrieval device was then captured into the microcatheter. The balloon in the right  internal carotid artery of the [facility] guide catheter was then inflated for proximal flow arrest. Thereafter, the combination of the retrieval device, the microcatheter and the 5 [place] Catalyst guide catheter was retrieved as aspiration was applied with a 60 mL syringe at the hub of the 8 [place] FlowGate guide catheter, and a 20 mL syringe at the hub of the 5 [place] Catalyst guide catheter. The triaxial combination was gently retrieved and removed as aspiration was continued with a 60 mL syringe. The retrieval device had a few specks of clot entangled within its interstices. The balloon was then deflated in the proximal right internal carotid artery. Free back bleed of blood was noted at the hub of the [employer] [place]. A control arteriogram performed through the [facility] guide catheter in the right internal carotid artery demonstrated continued occlusion of the right middle cerebral artery M1 segment proximally. A second attempt was then made this time using a Catalyst 5 [place] guide catheter of length 132 cm inside of which was a Trevo ProVue microcatheter again over a 0.014 inch Softip Synchro micro guidewire. As mentioned above this triaxial combination was advanced to the right internal carotid supraclinoid segment. The 5 [place] FlowGate guide catheter was then loaded into the supraclinoid right ICA. Access was obtained to the occluded right middle cerebral artery with the micro guidewire followed by the microcatheter this time into the inferior division of the right middle cerebral artery M2 M3 region. The microcatheter was advanced to this region. The guidewire was removed. Good aspiration obtained from the hub of the microcatheter. A gentle contrast injection demonstrated antegrade flow at the tip of the microcatheter. A 4 mm x 40 mm Solitaire FR retrieval device was then advanced again with constant heparinized saline infusion to the distal end of the microcatheter. Again the O ring on the delivery  microcatheter was loosened. The distal and the proximal markers of the retrieval device were then deployed as mentioned above. A control arteriogram performed through the 5 [place] Catalyst guide catheter in the right internal carotid artery supraclinoid segment demonstrated a TICI 2a reperfusion. With the balloon inflated in the right internal carotid artery for proximal flow arrest, and constant aspiration being applied at the hub of the 8 [place] FlowGate guide catheter with a 60 mL syringe, the combination of the retrieval device, the microcatheter and the 5 [place] Catalyst guide catheter was gently retrieved and removed. More clot was noted on the interstices of the retrieval device. Aspiration was continued as the balloon was deflated  in the right internal carotid artery. There continued to be poor aspiration. The FlowGate guide catheter was gently retrieved externally as constant aspiration was applied at the hub of the 8 [place] catheter. Clots were noted in the hub of the [place], and also in the aspirate as well as in the [facility] guide catheter. Control arteriogram performed through the diagnostic catheter position in the right common carotid artery demonstrated complete angiographic revascularization of the right middle cerebral artery achieving a TICI 3 reperfusion. No evidence of extravasation of contrast or mass-effect of the major vessel was noted intracranially. There continued to be patency of the anterior cerebral arteries, and the right posterior cerebral artery and the right anterior choroidal artery. The 8 French sheath was then retrieved into the abdominal aorta and exchanged over a J-tip guidewire for a 9 [place] Pinnacle sheath. This in turn was connected to continuous heparinized saline infusion. Throughout the procedure, the patient's hemodynamic status and neurological status remained stable. IMPRESSION: Status post endovascular complete revascularization of the occluded right middle  cerebral artery with 1 pass using the Embotrap 5 mm x 33 mm retrieval device, and 1 pass with the 4 mm x 40 mm Solitaire FR retrieval device achieving a TICI 3 reperfusion. Groin puncture time 01:53 p.m. TICI 3 reperfusion time 3 p.m. PLAN: Patient transported to the CT scan suite for postprocedural CT scan of the brain. Electronically Signed   By: Luanne Bras M.D.   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 Does the need for close, 24 hr/day medical supervision in concert with the patient's rehab needs make it unreasonable for this patient to be served in a less intensive setting? Yes 2. Co-Morbidities requiring supervision/potential complications: gout, htn, post-stroke sequelae 3. Due to bladder management, bowel management, safety, skin/wound care, disease management, medication administration, pain management and patient education, does the patient require 24 hr/day rehab nursing? Yes 4. Does the patient require coordinated care of a physician, rehab nurse, PT (1-2 hrs/day, 5 days/week), OT (1-2 hrs/day, 5 days/week) and SLP (1-2 hrs/day, 5 days/week) to address physical and functional deficits in the context of the above medical diagnosis(es)? Yes Addressing deficits in the following areas: balance, endurance, locomotion, strength, transferring, bowel/bladder control, bathing, dressing, feeding, grooming, toileting, cognition, speech, swallowing and psychosocial support 5. Can the patient actively participate in an intensive therapy program of at least 3 hrs of therapy per day at least 5 days per week? Yes 6. The potential for patient to make measurable gains while on inpatient rehab is excellent 7. Anticipated functional outcomes upon discharge from inpatient rehab are modified independent  with PT, modified independent and supervision with OT, modified independent with SLP. 8. Estimated rehab length of stay to reach  the above functional goals is: 13-16 days 9. Anticipated D/C setting: Home 10. Anticipated post D/C treatments: HH therapy and Outpatient therapy 11. Overall Rehab/Functional Prognosis: excellent  RECOMMENDATIONS: This patient's condition is appropriate for continued rehabilitative care in the following setting: CIR Patient has agreed to participate in recommended program. Yes Note that insurance prior authorization may be required for reimbursement for recommended care.  Comment: Rehab Admissions Coordinator to follow up.  Thanks,  Meredith Staggers, MD, Mellody Drown    Cathlyn Parsons., PA-C 10/26/2016
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
## 2016-10-26 NOTE — Progress Notes (Signed)
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 to represent asymmetric atrophy versus incidental hygromas. These are greater on the RIGHT. Vascular: Hyperdense RIGHT MCA, distal M1 and proximal M2, especially superior division, consistent with acute thrombosis or embolus. Calcification of the cavernous internal carotid arteries consistent with cerebrovascular atherosclerotic disease. Skull: Normal. Negative for fracture or focal lesion. Sinuses/Orbits: No acute finding. Other: None. ASPECTS [facility] Stroke Program Early CT Score) - Ganglionic level infarction (caudate, lentiform nuclei, internal capsule, insula, M1-M3 cortex): 7 - Supraganglionic infarction (M4-M6 cortex): 3 Total score (0-10 with 10 being normal): 10 IMPRESSION: 1. Signs of emergent large vessel occlusion affecting the distal M1 and proximal M2 RIGHT MCA. Asymmetric RIGHT greater than LEFT extra-axial hypoattenuating loop collection, favored to represent asymmetric atrophy versus incidental hygromas. No skull fracture or signs of intracranial hemorrhage. 2. ASPECTS is 10. These results were discussed in person at the time of interpretation on 10/24/2016 at 1:25 pm to Dr. Rory Percy , who verbally acknowledged these results. Electronically  Signed   By: Staci Righter M.D.   On: 10/24/2016 13:34   Ir Angio Extracran Sel Com Carotid Innominate Uni L Mod Sed  Result Date: 10/26/2016 INDICATION: Acute onset of right gaze deviation. Left sided hemiparesis. Abnormal CT scan of the brain with right middle cerebral artery hyperdense sign and occluded right middle cerebral artery on CT angiogram. EXAM: 1. EMERGENT LARGE VESSEL OCCLUSION THROMBOLYSIS (anterior CIRCULATION) COMPARISON:  CT angiogram of the head and neck of 10/24/2016. MEDICATIONS: Ancef 2 g IV was administered within 1 hour of the procedure. ANESTHESIA/SEDATION: General anesthesia. CONTRAST:  Isovue 300 85 cc. FLUOROSCOPY TIME:  Fluoroscopy Time: 35 minutes 42 seconds (1764 mGy). COMPLICATIONS: None immediate. TECHNIQUE: Following a full explanation of the procedure along with the potential associated complications, an informed witnessed consent was obtained. The risks of intracranial hemorrhage of 10%, worsening neurological deficit, ventilator dependency, death and inability to revascularize were all reviewed in detail with the patient's spouse and son. The patient was then put under general anesthesia by the Department of Anesthesiology at [facility] The right groin was prepped and draped in the usual sterile fashion. Thereafter using modified Seldinger technique, transfemoral access into the right common femoral artery was obtained without difficulty. Over a 0.035 inch guidewire a 5 French Pinnacle sheath was inserted. Through this, and also over a 0.035 inch guidewire a 5 [place] JB 1 catheter was advanced to the aortic arch region and selectively positioned in the right subclavian artery, the right common carotid artery, the left vertebral artery and the left common carotid artery. FINDINGS: The right subclavian arteriogram demonstrates the origin of the right vertebral artery to be normal. The vessel is seen to opacify to the cranial skull base to the level of the right  posterior-inferior cerebellar artery. A faint opacification is seen distal to this into the right vertebrobasilar junction. The right common carotid arteriogram demonstrates the right external carotid artery and its major branches to be widely patent.  The right internal carotid artery at the bulb to the cranial skull base opacifies normally. The petrous, the cavernous and the supraclinoid segments are widely patent. The right middle cerebral artery demonstrates complete occlusion in the proximal right M1 segment. The right anterior cerebral artery is seen to opacify distally into the capillary and venous phases. Cross-filling via the anterior communicating artery of the left anterior cerebral artery A2 segment and distally is noted. Also demonstrated is a right posterior communicating artery opacifying the right posterior cerebral artery distribution. A prominent right anterior choroidal artery, a developmental variation is also noted. The left common carotid arteriogram demonstrates the left external carotid artery and its major branches to be widely patent. The left internal carotid artery at the bulb to the cranial skull base opacifies normally. The petrous, the cavernous and the supraclinoid segments are widely patent. A left posterior communicating artery is seen opacifying the left posterior cerebral artery distribution. The left middle cerebral artery and the left anterior cerebral artery opacify into the capillary and venous phases. Prompt cross opacification of the right anterior cerebral A2 segment is noted via the anterior communicating artery. The origin of left vertebral artery demonstrates mild narrowing. The vessel, otherwise, opacifies to the cranial skull base. There is normal opacification of left vertebrobasilar junction. Normal opacification is seen of the left posterior-inferior cerebellar artery and the left vertebrobasilar junction distal to this. The opacified portion of the basilar artery,  the left posterior cerebral artery, the superior cerebellar arteries and the anterior-inferior cerebellar artery is grossly normal into the delayed arterial phase. Non-opacified blood is seen in the basilar artery from the contralateral vertebral artery. PROCEDURE: ENDOVASCULAR REVASCULARIZATION OF OCCLUDED RIGHT MIDDLE CEREBELLAR ARTERY M1 SEGMENT PROXIMALLY WITH MECHANICAL THROMBECTOMY. The diagnostic JB 1 catheter in the right common carotid artery was exchanged over a 0.035 inch 300 cm Rosen exchange guidewire for an 8 French 55 cm Brite tip neurovascular sheath using biplane roadmap technique and constant fluoroscopic guidance. Good aspiration obtained from the side port of the neurovascular sheath. This was then connected to continuous heparinized saline infusion. Over the [facility] exchange guidewire, a 95 cm 8 [place] FlowGate guide catheter which had been prepped with 50% contrast and 50% heparinized saline infusion was advanced and positioned just proximal to the right common carotid bifurcation. The guidewire was removed. Good aspiration obtained from the hub of the 8 French balloon FlowGate guide catheter. A gentle contrast injection demonstrated no evidence of spasms, dissections or of intraluminal filling defects. Over a 0.035 inch Roadrunner guidewire, the 8 [place] FlowGate guide catheter was then advanced to the junction of the distal and the proximal 1/3 of the right internal carotid artery. The guidewire was removed. Good aspiration obtained from the hub of the 8 [place] FlowGate guide catheter. A gentle contrast injection demonstrated no evidence of spasms, dissections or of intraluminal filling defects. A combination of 5 French 115 cm Catalyst guide catheter inside of which was a Kellogg 021 microcatheter was advanced over a 0.0141 Softip Synchro micro guidewire to the distal end of the 8 [place] FlowGate guide catheter. With the micro guidewire leading with a J-tip configuration, the combination  was navigated without difficulty to the supraclinoid right ICA. Using a torque device, access was obtained into the occluded right middle cerebral artery into the M2 M3 region of the superior division. The microcatheter was then advanced into the M2 M3 region of the superior division. The guidewire was removed. Good aspiration obtained from the hub of the microcatheter.  A gentle contrast injection demonstrated slow antegrade flow into the distal circulation. An Embotrap 5 mm x 33 mm retrieval device was then advanced in a coaxial manner and with constant heparinized saline infusion using biplane roadmap technique and constant fluoroscopic guidance to the distal end of the Trevo ProVue 021 microcatheter. The O ring on the delivery microcatheter was then loosened. The distal and the proximal retrieval markers were then aligned appropriately. Thereafter with slight forward traction with the right hand on the delivery micro guidewire, with the left hand the delivery microcatheter was retrieved unsheathing the distal and then the proximal portion of the retrieval device. A control arteriogram performed through the 5 [place] Catalyst guide catheter demonstrated continued occlusion of the right middle cerebral artery M1 segment. The proximal portion of the retrieval device was then captured into the microcatheter. The balloon in the right internal carotid artery of the [facility] guide catheter was then inflated for proximal flow arrest. Thereafter, the combination of the retrieval device, the microcatheter and the 5 [place] Catalyst guide catheter was retrieved as aspiration was applied with a 60 mL syringe at the hub of the 8 [place] FlowGate guide catheter, and a 20 mL syringe at the hub of the 5 [place] Catalyst guide catheter. The triaxial combination was gently retrieved and removed as aspiration was continued with a 60 mL syringe. The retrieval device had a few specks of clot entangled within its interstices. The balloon  was then deflated in the proximal right internal carotid artery. Free back bleed of blood was noted at the hub of the [employer] [place]. A control arteriogram performed through the [facility] guide catheter in the right internal carotid artery demonstrated continued occlusion of the right middle cerebral artery M1 segment proximally. A second attempt was then made this time using a Catalyst 5 [place] guide catheter of length 132 cm inside of which was a Trevo ProVue microcatheter again over a 0.014 inch Softip Synchro micro guidewire. As mentioned above this triaxial combination was advanced to the right internal carotid supraclinoid segment. The 5 [place] FlowGate guide catheter was then loaded into the supraclinoid right ICA. Access was obtained to the occluded right middle cerebral artery with the micro guidewire followed by the microcatheter this time into the inferior division of the right middle cerebral artery M2 M3 region. The microcatheter was advanced to this region. The guidewire was removed. Good aspiration obtained from the hub of the microcatheter. A gentle contrast injection demonstrated antegrade flow at the tip of the microcatheter. A 4 mm x 40 mm Solitaire FR retrieval device was then advanced again with constant heparinized saline infusion to the distal end of the microcatheter. Again the O ring on the delivery microcatheter was loosened. The distal and the proximal markers of the retrieval device were then deployed as mentioned above. A control arteriogram performed through the 5 [place] Catalyst guide catheter in the right internal carotid artery supraclinoid segment demonstrated a TICI 2a reperfusion. With the balloon inflated in the right internal carotid artery for proximal flow arrest, and constant aspiration being applied at the hub of the 8 [place] FlowGate guide catheter with a 60 mL syringe, the combination of the retrieval device, the microcatheter and the 5 [place] Catalyst guide catheter was  gently retrieved and removed. More clot was noted on the interstices of the retrieval device. Aspiration was continued as the balloon was deflated in the right internal carotid artery. There continued to be poor aspiration. The [facility] guide catheter was gently retrieved externally  as constant aspiration was applied at the hub of the 8 [place] catheter. Clots were noted in the hub of the [place], and also in the aspirate as well as in the [facility] guide catheter. Control arteriogram performed through the diagnostic catheter position in the right common carotid artery demonstrated complete angiographic revascularization of the right middle cerebral artery achieving a TICI 3 reperfusion. No evidence of extravasation of contrast or mass-effect of the major vessel was noted intracranially. There continued to be patency of the anterior cerebral arteries, and the right posterior cerebral artery and the right anterior choroidal artery. The 8 French sheath was then retrieved into the abdominal aorta and exchanged over a J-tip guidewire for a 9 [place] Pinnacle sheath. This in turn was connected to continuous heparinized saline infusion. Throughout the procedure, the patient's hemodynamic status and neurological status remained stable. IMPRESSION: Status post endovascular complete revascularization of the occluded right middle cerebral artery with 1 pass using the Embotrap 5 mm x 33 mm retrieval device, and 1 pass with the 4 mm x 40 mm Solitaire FR retrieval device achieving a TICI 3 reperfusion. Groin puncture time 01:53 p.m. TICI 3 reperfusion time 3 p.m. PLAN: Patient transported to the CT scan suite for postprocedural CT scan of the brain. Electronically Signed   By: Luanne Bras M.D.   On: 10/25/2016 20:04   Ir Angio Vertebral Sel Subclavian Innominate Bilat Mod Sed  Result Date: 10/26/2016 INDICATION: Acute onset of right gaze deviation. Left sided hemiparesis. Abnormal CT scan of the brain with right  middle cerebral artery hyperdense sign and occluded right middle cerebral artery on CT angiogram. EXAM: 1. EMERGENT LARGE VESSEL OCCLUSION THROMBOLYSIS (anterior CIRCULATION) COMPARISON:  CT angiogram of the head and neck of 10/24/2016. MEDICATIONS: Ancef 2 g IV was administered within 1 hour of the procedure. ANESTHESIA/SEDATION: General anesthesia. CONTRAST:  Isovue 300 85 cc. FLUOROSCOPY TIME:  Fluoroscopy Time: 35 minutes 42 seconds (1764 mGy). COMPLICATIONS: None immediate. TECHNIQUE: Following a full explanation of the procedure along with the potential associated complications, an informed witnessed consent was obtained. The risks of intracranial hemorrhage of 10%, worsening neurological deficit, ventilator dependency, death and inability to revascularize were all reviewed in detail with the patient's spouse and son. The patient was then put under general anesthesia by the Department of Anesthesiology at [facility]. The right groin was prepped and draped in the usual sterile fashion. Thereafter using modified Seldinger technique, transfemoral access into the right common femoral artery was obtained without difficulty. Over a 0.035 inch guidewire a 5 French Pinnacle sheath was inserted. Through this, and also over a 0.035 inch guidewire a 5 [place] JB 1 catheter was advanced to the aortic arch region and selectively positioned in the right subclavian artery, the right common carotid artery, the left vertebral artery and the left common carotid artery. FINDINGS: The right subclavian arteriogram demonstrates the origin of the right vertebral artery to be normal. The vessel is seen to opacify to the cranial skull base to the level of the right posterior-inferior cerebellar artery. A faint opacification is seen distal to this into the right vertebrobasilar junction. The right common carotid arteriogram demonstrates the right external carotid artery and its major branches to be widely patent. The right  internal carotid artery at the bulb to the cranial skull base opacifies normally. The petrous, the cavernous and the supraclinoid segments are widely patent. The right middle cerebral artery demonstrates complete occlusion in the proximal right M1 segment. The right anterior cerebral  artery is seen to opacify distally into the capillary and venous phases. Cross-filling via the anterior communicating artery of the left anterior cerebral artery A2 segment and distally is noted. Also demonstrated is a right posterior communicating artery opacifying the right posterior cerebral artery distribution. A prominent right anterior choroidal artery, a developmental variation is also noted. The left common carotid arteriogram demonstrates the left external carotid artery and its major branches to be widely patent. The left internal carotid artery at the bulb to the cranial skull base opacifies normally. The petrous, the cavernous and the supraclinoid segments are widely patent. A left posterior communicating artery is seen opacifying the left posterior cerebral artery distribution. The left middle cerebral artery and the left anterior cerebral artery opacify into the capillary and venous phases. Prompt cross opacification of the right anterior cerebral A2 segment is noted via the anterior communicating artery. The origin of left vertebral artery demonstrates mild narrowing. The vessel, otherwise, opacifies to the cranial skull base. There is normal opacification of left vertebrobasilar junction. Normal opacification is seen of the left posterior-inferior cerebellar artery and the left vertebrobasilar junction distal to this. The opacified portion of the basilar artery, the left posterior cerebral artery, the superior cerebellar arteries and the anterior-inferior cerebellar artery is grossly normal into the delayed arterial phase. Non-opacified blood is seen in the basilar artery from the contralateral vertebral artery.  PROCEDURE: ENDOVASCULAR REVASCULARIZATION OF OCCLUDED RIGHT MIDDLE CEREBELLAR ARTERY M1 SEGMENT PROXIMALLY WITH MECHANICAL THROMBECTOMY. The diagnostic JB 1 catheter in the right common carotid artery was exchanged over a 0.035 inch 300 cm Rosen exchange guidewire for an 8 French 55 cm Brite tip neurovascular sheath using biplane roadmap technique and constant fluoroscopic guidance. Good aspiration obtained from the side port of the neurovascular sheath. This was then connected to continuous heparinized saline infusion. Over the [facility] exchange guidewire, a 95 cm 8 [place] FlowGate guide catheter which had been prepped with 50% contrast and 50% heparinized saline infusion was advanced and positioned just proximal to the right common carotid bifurcation. The guidewire was removed. Good aspiration obtained from the hub of the 8 French balloon FlowGate guide catheter. A gentle contrast injection demonstrated no evidence of spasms, dissections or of intraluminal filling defects. Over a 0.035 inch Roadrunner guidewire, the 8 [place] FlowGate guide catheter was then advanced to the junction of the distal and the proximal 1/3 of the right internal carotid artery. The guidewire was removed. Good aspiration obtained from the hub of the 8 [place] FlowGate guide catheter. A gentle contrast injection demonstrated no evidence of spasms, dissections or of intraluminal filling defects. A combination of 5 French 115 cm Catalyst guide catheter inside of which was a Kellogg 021 microcatheter was advanced over a 0.0141 Softip Synchro micro guidewire to the distal end of the 8 [place] FlowGate guide catheter. With the micro guidewire leading with a J-tip configuration, the combination was navigated without difficulty to the supraclinoid right ICA. Using a torque device, access was obtained into the occluded right middle cerebral artery into the M2 M3 region of the superior division. The microcatheter was then advanced into the M2 M3  region of the superior division. The guidewire was removed. Good aspiration obtained from the hub of the microcatheter. A gentle contrast injection demonstrated slow antegrade flow into the distal circulation. An Embotrap 5 mm x 33 mm retrieval device was then advanced in a coaxial manner and with constant heparinized saline infusion using biplane roadmap technique and constant fluoroscopic guidance to the  distal end of the Trevo ProVue 021 microcatheter. The O ring on the delivery microcatheter was then loosened. The distal and the proximal retrieval markers were then aligned appropriately. Thereafter with slight forward traction with the right hand on the delivery micro guidewire, with the left hand the delivery microcatheter was retrieved unsheathing the distal and then the proximal portion of the retrieval device. A control arteriogram performed through the 5 [place] Catalyst guide catheter demonstrated continued occlusion of the right middle cerebral artery M1 segment. The proximal portion of the retrieval device was then captured into the microcatheter. The balloon in the right internal carotid artery of the [facility] guide catheter was then inflated for proximal flow arrest. Thereafter, the combination of the retrieval device, the microcatheter and the 5 [place] Catalyst guide catheter was retrieved as aspiration was applied with a 60 mL syringe at the hub of the 8 [place] FlowGate guide catheter, and a 20 mL syringe at the hub of the 5 [place] Catalyst guide catheter. The triaxial combination was gently retrieved and removed as aspiration was continued with a 60 mL syringe. The retrieval device had a few specks of clot entangled within its interstices. The balloon was then deflated in the proximal right internal carotid artery. Free back bleed of blood was noted at the hub of the [employer] [place]. A control arteriogram performed through the [facility] guide catheter in the right internal carotid artery demonstrated  continued occlusion of the right middle cerebral artery M1 segment proximally. A second attempt was then made this time using a Catalyst 5 [place] guide catheter of length 132 cm inside of which was a Trevo ProVue microcatheter again over a 0.014 inch Softip Synchro micro guidewire. As mentioned above this triaxial combination was advanced to the right internal carotid supraclinoid segment. The 5 [place] FlowGate guide catheter was then loaded into the supraclinoid right ICA. Access was obtained to the occluded right middle cerebral artery with the micro guidewire followed by the microcatheter this time into the inferior division of the right middle cerebral artery M2 M3 region. The microcatheter was advanced to this region. The guidewire was removed. Good aspiration obtained from the hub of the microcatheter. A gentle contrast injection demonstrated antegrade flow at the tip of the microcatheter. A 4 mm x 40 mm Solitaire FR retrieval device was then advanced again with constant heparinized saline infusion to the distal end of the microcatheter. Again the O ring on the delivery microcatheter was loosened. The distal and the proximal markers of the retrieval device were then deployed as mentioned above. A control arteriogram performed through the 5 [place] Catalyst guide catheter in the right internal carotid artery supraclinoid segment demonstrated a TICI 2a reperfusion. With the balloon inflated in the right internal carotid artery for proximal flow arrest, and constant aspiration being applied at the hub of the 8 [place] FlowGate guide catheter with a 60 mL syringe, the combination of the retrieval device, the microcatheter and the 5 [place] Catalyst guide catheter was gently retrieved and removed. More clot was noted on the interstices of the retrieval device. Aspiration was continued as the balloon was deflated in the right internal carotid artery. There continued to be poor aspiration. The FlowGate guide catheter  was gently retrieved externally as constant aspiration was applied at the hub of the 8 [place] catheter. Clots were noted in the hub of the [place], and also in the aspirate as well as in the [facility] guide catheter. Control arteriogram performed through the diagnostic catheter position  in the right common carotid artery demonstrated complete angiographic revascularization of the right middle cerebral artery achieving a TICI 3 reperfusion. No evidence of extravasation of contrast or mass-effect of the major vessel was noted intracranially. There continued to be patency of the anterior cerebral arteries, and the right posterior cerebral artery and the right anterior choroidal artery. The 8 French sheath was then retrieved into the abdominal aorta and exchanged over a J-tip guidewire for a 9 [place] Pinnacle sheath. This in turn was connected to continuous heparinized saline infusion. Throughout the procedure, the patient's hemodynamic status and neurological status remained stable. IMPRESSION: Status post endovascular complete revascularization of the occluded right middle cerebral artery with 1 pass using the Embotrap 5 mm x 33 mm retrieval device, and 1 pass with the 4 mm x 40 mm Solitaire FR retrieval device achieving a TICI 3 reperfusion. Groin puncture time 01:53 p.m. TICI 3 reperfusion time 3 p.m. PLAN: Patient transported to the CT scan suite for postprocedural CT scan of the brain. Electronically Signed   By: Luanne Bras M.D.   On: 10/25/2016 20:04    Labs:  CBC:  Recent Labs  10/24/16 1301 10/24/16 1310 10/25/16 0317 10/26/16 0938  WBC 5.8  --  7.5 8.0  HGB 12.6* 13.3 11.1* 10.7*  HCT 37.6* 39.0 33.8* 32.3*  PLT 112*  --  101* 88*    COAGS:  Recent Labs  10/24/16 1301  INR 1.10  APTT 37*    BMP:  Recent Labs  10/24/16 1301 10/24/16 1310 10/25/16 0317 10/26/16 0938  NA 138 143 138 139  K 4.0 4.0 3.6 4.0  CL 111 109 110 115*  CO2 20*  --  21* 19*  GLUCOSE 124*  119* 194* 121*  BUN 13 13 13 12   CALCIUM 8.9  --  7.6* 8.0*  CREATININE 0.82 0.70 0.86 0.80  GFRNONAA >60  --  >60 >60  GFRAA >60  --  >60 >60    LIVER FUNCTION TESTS:  Recent Labs  10/24/16 1301  BILITOT 0.9  AST 28  ALT 20  ALKPHOS 70  PROT 7.0  ALBUMIN 4.1    Assessment and Plan: 1. CVA, s/p Complete revascularization with x1 pass with embotrap 3.60mmx 104mm,and x1 pass with 16mm x 40 mm solitaire FR retrieval device achieving a TICI 3 reperfusion  Patient doing very well.  He is ambulating in the halls well.  He is holding a normal conversation.  He is eating well.  No further NIR needs at this time.  He will need to follow up with Dr. Estanislado Pandy 2 weeks after discharge.  Electronically Signed: Henreitta Cea 10/26/2016, 1:37 PM   I spent a total of 15 Minutes at the the patient's bedside AND on the patient's hospital floor or unit, greater than 50% of which was counseling/coordinating care for CVA
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
## 2016-10-26 NOTE — Evaluation (Signed)
Speech Language Pathology Evaluation Patient Details Name: [name] MRN: [id number] DOB: 1935-03-04 [name] Date: 10/26/2016 Time: 9485-4627 SLP Time Calculation (min) (ACUTE ONLY): 15 min  Problem List:  Patient Active Problem List   Diagnosis Date Noted  . CVA (cerebral vascular accident) ([place]) 10/24/2016  . HTN (hypertension)   . Gout   . History of small bowel obstruction   . History of BPH   . S/P AVR 08/03/2010   Past Medical History:  Past Medical History:  Diagnosis Date  . Bladder stones   . Borderline diabetes   . BPH (benign prostatic hyperplasia)   . Coronary artery disease    cardiologist-  dr Nat Math gerhart NP--- per cath 06-02-2010 non-obstructive cad pLAD 30-40%  . Diverticulosis of colon   . DOE (dyspnea on exertion)   . Gout   . History of adenomatous polyp of colon    2002-- tubular adenoma  . History of aortic insufficiency    severe -- s/p  AVR 08-03-2010  . History of basal cell carcinoma (BCC) excision    10/ 2015  left ear  . History of small bowel obstruction    02/ 2007 mechanical sbo s/p  surgical intervention;  partial sbo 09/ 2011 and 03-20-2011 resolved without surgical intervention  . History of squamous cell carcinoma excision    2014 right foot  . History of urinary retention   . HTN (hypertension)   . Peripheral neuropathy   . S/P aortic valve replacement with prosthetic valve 08/03/2010   tissue valve  . S/P patent foramen ovale closure 08/03/2010   at same time AVR   Past Surgical History:  Past Surgical History:  Procedure Laterality Date  . CARDIAC CATHETERIZATION  06-02-2010  dr Marlou Porch   non-obstructive cad- pLAD 30-40%/  normal LVSF/  severe AI  . CARDIOVASCULAR STRESS TEST  04/12/2016   Low risk nuclear perfusion study w/ no significant reversible ischemia/  normal LV function and wall motion ,  stress ef 60%/  32mm inferior and lateral scooped ST-segment depression w/ exercise (may be repolarization  abnormality), exercise capacity was moderately reduced  . CATARACT EXTRACTION W/ INTRAOCULAR LENS  IMPLANT, BILATERAL  02/2010  . CYSTOSCOPY WITH LITHOLAPAXY N/A 06/05/2016   Procedure: CYSTOSCOPY WITH LITHOLAPAXY and fulgarization of bladder neck;  Surgeon: Irine Seal, MD;  Location: [facility];  Service: Urology;  Laterality: N/A;  . EXPLORATORY LAPARTOMY /  CHOLECYSTECTOMY  02/28/2005   for Small  bowel obstruction (mechnical)  . LEFT KNEE ARTHROSCOPY  2006  . RADIOLOGY WITH ANESTHESIA N/A 10/24/2016   Procedure: RADIOLOGY WITH ANESTHESIA;  Surgeon: Luanne Bras, MD;  Location: [facility];  Service: Radiology;  Laterality: N/A;  . RIGHT FOOT SURGERY    . RIGHT MINIATURE ANTERIOR THORACOTOMY FOR AORTIC VALVE REPLACEMENT AND CLOSURE PATENT FORAMEN OVALE  08-03-2010  DR Levada Schilling Magna-ease pericardial tissue valve (64mm)  . TRANSTHORACIC ECHOCARDIOGRAM  05/30/2016  dr skains   moderate  LVH ef 60-65%/  bioprothesis aortic valve present ,normal grandient and no AI /  mild MV calcification , moderate MR /  mild PR/ moderate TR/  PASP 71mmHg/ (RA denisty was identified 04-27-2016 echo) and is seen again today, this is likely a promient eustacian ridge, atrium is normal size   HPI:  81 year old male with hx HTN, DM, AI s/p AVR w/prothestic valve (7/26/201) and PFO repair, BPH suffered CVA 10/17. Was taken to IR for revascularization and was sent to ICU on  vent post-operatively.  ETT -10/17-18. MRI - acute infarct right basal ganglia, insula, and scattered tiny areas acute infarct cerebral cortex bilaterally right>left; small hemorrhage right putamen.      Assessment / Plan / Recommendation Clinical Impression  Pt presents with mild-moderate dysarthria of speech; basic cognitive assessment reveals adequate short-term memory, sustained attention, and intellectual awareness. Pt will benefit from SLP intervention to address clarity of speech/communication and for further assessment  of higher level cognition.       SLP Assessment  SLP Recommendation/Assessment: Patient needs continued Speech Lanaguage Pathology Services SLP Visit Diagnosis: Cognitive communication deficit (R41.841)    Follow Up Recommendations  Other (comment) (pending OT/PT evals)    Frequency and Duration min 2x/week  2 weeks      SLP Evaluation Cognition  Overall Cognitive Status: Impaired/Different from baseline Arousal/Alertness: Awake/alert Orientation Level: Oriented to person;Oriented to place;Oriented to situation;Disoriented to time (1918) Attention: Sustained Sustained Attention: Appears intact Memory: Appears intact (for 3 and 5 word retention) Awareness: Appears intact Safety/Judgment: Other (comment)       Comprehension  Auditory Comprehension Overall Auditory Comprehension: Appears within functional limits for tasks assessed Visual Recognition/Discrimination Discrimination: Within Function Limits Reading Comprehension Reading Status: Not tested    Expression Expression Primary Mode of Expression: Verbal Verbal Expression Overall Verbal Expression: Impaired Repetition: No impairment Naming: No impairment Written Expression Dominant Hand: Right (but using left hand for all tasks despite saying he is right) Written Expression: Not tested   Oral / Motor  Oral Motor/Sensory Function Overall Oral Motor/Sensory Function: Other (comment) (CN XII bilaterally; CN VII lower left) Motor Speech Overall Motor Speech: Impaired Respiration: Within functional limits Phonation: Hoarse Resonance: Hypernasality Articulation: Impaired Level of Impairment: Sentence Intelligibility: Intelligibility reduced Word: 75-100% accurate Phrase: 75-100% accurate Sentence: 75-100% accurate Conversation: 75-100% accurate Motor Planning: Witnin functional limits Motor Speech Errors: Not applicable   GO                    Juan Quam Laurice 10/26/2016, 9:47 AM

## 2016-10-26 NOTE — Progress Notes (Signed)
Inpatient Rehabilitation  Per PT request, patient was screened by Acea Yagi for appropriateness for an Inpatient Acute Rehab consult.  At this time we are recommending an Inpatient Rehab consult.  Please order if you are agreeable.    Ketzia Guzek, M.A., CCC/SLP Admission Coordinator  [facility]  Cell [phone]  

## 2016-10-27 ENCOUNTER — Inpatient Hospital Stay (HOSPITAL_COMMUNITY): Payer: Medicare Other

## 2016-10-27 DIAGNOSIS — R001 Bradycardia, unspecified: Secondary | ICD-10-CM

## 2016-10-27 DIAGNOSIS — I63411 Cerebral infarction due to embolism of right middle cerebral artery: Principal | ICD-10-CM

## 2016-10-27 DIAGNOSIS — I361 Nonrheumatic tricuspid (valve) insufficiency: Secondary | ICD-10-CM

## 2016-10-27 DIAGNOSIS — I340 Nonrheumatic mitral (valve) insufficiency: Secondary | ICD-10-CM

## 2016-10-27 LAB — GLUCOSE, CAPILLARY
Glucose-Capillary: 92 mg/dL (ref 65–99)
Glucose-Capillary: 106 mg/dL — ABNORMAL HIGH (ref 65–99)
Glucose-Capillary: 128 mg/dL — ABNORMAL HIGH (ref 65–99)
Glucose-Capillary: 101 mg/dL — ABNORMAL HIGH (ref 65–99)

## 2016-10-27 LAB — BASIC METABOLIC PANEL
Anion gap: 8 (ref 5–15)
BUN: 10 mg/dL (ref 6–20)
CO2: 18 mmol/L — ABNORMAL LOW (ref 22–32)
Calcium: 7.8 mg/dL — ABNORMAL LOW (ref 8.9–10.3)
Chloride: 110 mmol/L (ref 101–111)
Creatinine, Ser: 0.71 mg/dL (ref 0.61–1.24)
GFR calc Af Amer: >60 mL/min (ref >60)
GFR calc non Af Amer: >60 mL/min (ref >60)
Glucose, Bld: 95 mg/dL (ref 65–99)
Potassium: 3.5 mmol/L (ref 3.5–5.1)
Sodium: 136 mmol/L (ref 135–145)

## 2016-10-27 LAB — ECHOCARDIOGRAM COMPLETE
AO mean calculated velocity dopler: 170 cm/s
AV Area VTI index: 0.76 cm2/m2
AV Area VTI: 1.34 cm2
AV Area mean vel: 1.39 cm2
AV Mean grad: 14 mmHg
AV Peak grad: 32 mmHg
AV VEL mean LVOT/AV: 0.44
AV area mean vel ind: 0.65 cm2/m2
AV peak Index: 0.63
AV pk vel: 283 cm/s
AV vel: 1.62
Ao pk vel: 0.43 m/s
Ao-asc: 34 cm
Area-P 1/2: 4.68 cm2
E decel time: 162 msec
E/e' ratio: 10.14
FS: 33 % (ref 28–44)
Height: 72 in
IVS/LV PW RATIO, ED: 1.06
LA ID, A-P, ES: 48 mm
LA diam end sys: 48 mm
LA diam index: 2.24 cm/m2
LA vol A4C: 112 ml
LA vol index: 60.3 mL/m2
LA vol: 129 mL
LV E/e' medial: 10.14
LV E/e'average: 10.14
LV PW d: 17 mm — AB (ref 0.6–1.1)
LV e' LATERAL: 14.6 cm/s
LVOT SV: 81 mL
LVOT VTI: 25.8 cm
LVOT area: 3.14 cm2
LVOT diameter: 20 mm
LVOT peak VTI: 0.51 cm
LVOT peak grad rest: 6 mmHg
LVOT peak vel: 121 cm/s
Lateral S' vel: 12.8 cm/s
MV Dec: 162
MV Peak grad: 9 mmHg
MV pk A vel: 36.6 m/s
MV pk E vel: 148 m/s
P 1/2 time: 47 ms
RV sys press: 51 mmHg
Reg peak vel: 300 cm/s
TAPSE: 18.3 mm
TDI e' lateral: 14.6
TDI e' medial: 9.9
TR max vel: 300 cm/s
VTI: 50.1 cm
Valve area index: 0.76
Valve area: 1.62 cm2
Weight: 3146.41 oz

## 2016-10-27 LAB — CBC
HCT: 32 % — ABNORMAL LOW (ref 39.0–52.0)
Hemoglobin: 10.7 g/dL — ABNORMAL LOW (ref 13.0–17.0)
MCH: 32.1 pg (ref 26.0–34.0)
MCHC: 33.4 g/dL (ref 30.0–36.0)
MCV: 96.1 fL (ref 78.0–100.0)
Platelets: 90 10*3/uL — ABNORMAL LOW (ref 150–400)
RBC: 3.33 MIL/uL — ABNORMAL LOW (ref 4.22–5.81)
RDW: 13.6 % (ref 11.5–15.5)
WBC: 8 10*3/uL (ref 4.0–10.5)

## 2016-10-27 LAB — PHOSPHORUS: Phosphorus: 1.5 mg/dL — ABNORMAL LOW (ref 2.5–4.6)

## 2016-10-27 LAB — MAGNESIUM: Magnesium: 1.8 mg/dL (ref 1.7–2.4)

## 2016-10-27 MED ORDER — ATENOLOL 25 MG PO TABS
37.5000 mg | ORAL_TABLET | Freq: Every day | ORAL | Status: DC
  Administered 2016-10-28 – 2016-10-29 (×2): 37.5 mg via ORAL
  Filled 2016-10-27 (×2): qty 2

## 2016-10-27 MED ORDER — ENOXAPARIN SODIUM 40 MG/0.4ML ~~LOC~~ SOLN
40.0000 mg | SUBCUTANEOUS | Status: DC
  Administered 2016-10-27 – 2016-10-28 (×2): 40 mg via SUBCUTANEOUS
  Filled 2016-10-27 (×4): qty 0.4

## 2016-10-27 MED ORDER — LABETALOL HCL 5 MG/ML IV SOLN: 5.0000 mg | INTRAVENOUS | Status: DC | PRN

## 2016-10-27 NOTE — Progress Notes (Signed)
Received from 4n icu via wheelchair;  Patient oriented to room and unit routine; family at bedside.
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
## 2016-10-27 NOTE — Progress Notes (Signed)
  Echocardiogram 2D Echocardiogram has been performed.  [name] 10/27/2016, 2:35 PM

## 2016-10-27 NOTE — Progress Notes (Signed)
Progress Note  Patient Name: [name] Date of Encounter: 10/27/2016  Primary Cardiologist: Dr. Marlou Porch  Subjective   Sleepy.  Denies chest pain, palpitations, or lightheadedness.    Inpatient Medications    Scheduled Meds: . amLODipine  5 mg Oral Daily  . aspirin  325 mg Per NG tube Daily  . atenolol  50 mg Oral Daily  . enoxaparin (LOVENOX) injection  40 mg Subcutaneous Q24H  . finasteride  5 mg Oral Daily  . insulin aspart  0-9 Units Subcutaneous TID AC & HS  . RESOURCE THICKENUP CLEAR   Oral Once  . tamsulosin  0.8 mg Oral QPC breakfast   Continuous Infusions:  PRN Meds: acetaminophen **OR** [DISCONTINUED] acetaminophen (TYLENOL) oral liquid 160 mg/5 mL **OR** [DISCONTINUED] acetaminophen, bisacodyl, labetalol, ondansetron (ZOFRAN) IV, senna-docusate   Vital Signs    Vitals:   10/27/16 0900 10/27/16 1000 10/27/16 1116 10/27/16 1200  BP: (!) 158/110 (!) 165/101 (!) 142/89 (!) 157/84  Pulse: 74 68 73 (!) 54  Resp: 18 18  14   Temp:    98.2 F (36.8 C)  TempSrc:    Oral  SpO2: 95% 96%  96%  Weight:      Height:        Intake/Output Summary (Last 24 hours) at 10/27/16 1304 Last data filed at 10/27/16 1100  Gross per 24 hour  Intake           1075.5 ml  Output             1650 ml  Net           -574.5 ml   Filed Weights   10/24/16 1645 10/26/16 0500 10/27/16 0500  Weight: 85.1 kg (187 lb 9.8 oz) 88.7 kg (195 lb 8.8 oz) 89.2 kg (196 lb 10.4 oz)    Telemetry    Atrial fibrillation.  Rates <100.  Some rates in the 30s.  2 sec pauses.  - Personally Reviewed  ECG    N/a  Personally Reviewed  Physical Exam   VS:  BP (!) 157/84 (BP Location: Right Arm)   Pulse (!) 54   Temp 98.2 F (36.8 C) (Oral)   Resp 14   Ht 6' (1.829 m)   Wt 89.2 kg (196 lb 10.4 oz)   SpO2 96%   BMI 26.67 kg/m  , BMI Body mass index is 26.67 kg/m. GENERAL:  Well appearing.  No acute distress HEENT: Pupils equal round and reactive, fundi not visualized, oral mucosa  unremarkable NECK:  No jugular venous distention, waveform within normal limits, carotid upstroke brisk and symmetric, no bruits, no thyromegaly LUNGS:  Clear to auscultation bilaterally HEART: Irregularly irregular. PMI not displaced or sustained,S1 and S2 within normal limits, no S3, no S4, no clicks, no rubs, II/VI systolic murmur at the RUSB  ABD:  Flat, positive bowel sounds normal in frequency in pitch, no bruits, no rebound, no guarding, no midline pulsatile mass, no hepatomegaly, no splenomegaly EXT:  2 plus pulses throughout, no edema, no cyanosis no clubbing SKIN:  No rashes no nodules NEURO:  L UE/LE mild weakness. PSYCH:  Cognitively intact, oriented to person place and time.  Sleepy but responds to questions appropriately.    Labs    Chemistry Recent Labs Lab 10/24/16 1301  10/25/16 0317 10/26/16 0938 10/27/16 0410  NA 138  < > 138 139 136  K 4.0  < > 3.6 4.0 3.5  CL 111  < > 110 115* 110  CO2 20*  --  21* 19* 18*  GLUCOSE 124*  < > 194* 121* 95  BUN 13  < > 13 12 10   CREATININE 0.82  < > 0.86 0.80 0.71  CALCIUM 8.9  --  7.6* 8.0* 7.8*  PROT 7.0  --   --   --   --   ALBUMIN 4.1  --   --   --   --   AST 28  --   --   --   --   ALT 20  --   --   --   --   ALKPHOS 70  --   --   --   --   BILITOT 0.9  --   --   --   --   GFRNONAA >60  --  >60 >60 >60  GFRAA >60  --  >60 >60 >60  ANIONGAP 7  --  7 5 8   < > = values in this interval not displayed.   Hematology Recent Labs Lab 10/25/16 0317 10/26/16 0938 10/27/16 0410  WBC 7.5 8.0 8.0  RBC 3.48* 3.32* 3.33*  HGB 11.1* 10.7* 10.7*  HCT 33.8* 32.3* 32.0*  MCV 97.1 97.3 96.1  MCH 31.9 32.2 32.1  MCHC 32.8 33.1 33.4  RDW 13.7 13.8 13.6  PLT 101* 88* 90*    Cardiac Enzymes Recent Labs Lab 10/24/16 2024 10/25/16 0135  TROPONINI <0.03 <0.03    Recent Labs Lab 10/24/16 1308  TROPIPOC 0.00     BNPNo results for input(s): BNP, PROBNP in the last 168 hours.   DDimer No results for input(s): DDIMER in  the last 168 hours.   Radiology    No results found.  Cardiac Studies   Echo 05/30/16: Study Conclusions  - Left ventricle: The cavity size was normal. Wall thickness was   increased in a pattern of moderate LVH. Systolic function was   normal. The estimated ejection fraction was in the range of 60%   to 65%. Wall motion was normal; there were no regional wall   motion abnormalities. - Aortic valve: A bioprosthesis was present. Valve area (VTI): 1.32   cm^2. Valve area (Vmax): 1.1 cm^2. Valve area (Vmean): 1.09 cm^2. - Mitral valve: Mildly calcified annulus. There was moderate   regurgitation. - Tricuspid valve: There was moderate regurgitation. - Pulmonary arteries: Systolic pressure was mildly increased. PA   peak pressure: 35 mm Hg (S).   Patient Profile     81 y.o. male s/p bioprosthetic AVR here with R MCA stroke and found to have atrial fibrillation.   Assessment & Plan    # Paroxysmal atrial fibrillation:  Mr. Domangue remains in afib.  He has been bradycardic, though asymptomatic.  Rates have been in the 30s at times.  We will reduce atenolol to 37.5mg .  Start coumadin when stable per neurology.   # s/p AVR: Echo pending. WIll need coumadin when able to start anticoagulation.   # Hypertension: BP elevated, but allowing permissive hypertension.  Given that atenolol will be reduced, would increase amlodipine to 10mg  once OK per neurology.   For questions or updates, please contact [place] Please consult [email or web address] for contact info under Cardiology/STEMI.      Signed, Skeet Latch, MD  10/27/2016, 1:04 PM
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
## 2016-10-28 DIAGNOSIS — I4891 Unspecified atrial fibrillation: Secondary | ICD-10-CM

## 2016-10-28 LAB — CBC
HCT: 31.1 % — ABNORMAL LOW (ref 39.0–52.0)
Hemoglobin: 10.6 g/dL — ABNORMAL LOW (ref 13.0–17.0)
MCH: 32.5 pg (ref 26.0–34.0)
MCHC: 34.1 g/dL (ref 30.0–36.0)
MCV: 95.4 fL (ref 78.0–100.0)
Platelets: 97 10*3/uL — ABNORMAL LOW (ref 150–400)
RBC: 3.26 MIL/uL — ABNORMAL LOW (ref 4.22–5.81)
RDW: 13.5 % (ref 11.5–15.5)
WBC: 7.9 10*3/uL (ref 4.0–10.5)

## 2016-10-28 LAB — BASIC METABOLIC PANEL
Anion gap: 9 (ref 5–15)
BUN: 9 mg/dL (ref 6–20)
CO2: 19 mmol/L — ABNORMAL LOW (ref 22–32)
Calcium: 8.3 mg/dL — ABNORMAL LOW (ref 8.9–10.3)
Chloride: 110 mmol/L (ref 101–111)
Creatinine, Ser: 0.67 mg/dL (ref 0.61–1.24)
GFR calc Af Amer: >60 mL/min (ref >60)
GFR calc non Af Amer: >60 mL/min (ref >60)
Glucose, Bld: 93 mg/dL (ref 65–99)
Potassium: 3.4 mmol/L — ABNORMAL LOW (ref 3.5–5.1)
Sodium: 138 mmol/L (ref 135–145)

## 2016-10-28 LAB — GLUCOSE, CAPILLARY
Glucose-Capillary: 91 mg/dL (ref 65–99)
Glucose-Capillary: 116 mg/dL — ABNORMAL HIGH (ref 65–99)
Glucose-Capillary: 109 mg/dL — ABNORMAL HIGH (ref 65–99)
Glucose-Capillary: 115 mg/dL — ABNORMAL HIGH (ref 65–99)

## 2016-10-28 MED ORDER — AMLODIPINE BESYLATE 5 MG PO TABS
10.0000 mg | ORAL_TABLET | Freq: Every day | ORAL | Status: DC
  Administered 2016-10-28 – 2016-10-29 (×2): 10 mg via ORAL
  Filled 2016-10-28 (×2): qty 2

## 2016-10-28 MED ORDER — BACLOFEN 1 MG/ML ORAL SUSPENSION
5.0000 mg | Freq: Three times a day (TID) | ORAL | Status: DC
  Filled 2016-10-28: qty 0.5

## 2016-10-28 MED ORDER — POTASSIUM CHLORIDE 20 MEQ/15ML (10%) PO SOLN
40.0000 meq | ORAL | Status: AC
  Administered 2016-10-28 (×2): 40 meq via ORAL
  Filled 2016-10-28 (×2): qty 30

## 2016-10-28 MED ORDER — BACLOFEN 1 MG/ML ORAL SUSPENSION: 5.0000 mg | Freq: Three times a day (TID) | ORAL | Status: DC

## 2016-10-28 MED ORDER — BACLOFEN 10 MG PO TABS
5.0000 mg | ORAL_TABLET | Freq: Three times a day (TID) | ORAL | Status: DC
  Administered 2016-10-28 – 2016-10-29 (×3): 5 mg via ORAL
  Filled 2016-10-28 (×4): qty 1

## 2016-10-28 MED ORDER — POTASSIUM CHLORIDE CRYS ER 20 MEQ PO TBCR
40.0000 meq | EXTENDED_RELEASE_TABLET | ORAL | Status: DC
  Administered 2016-10-28: 40 meq via ORAL
  Filled 2016-10-28 (×2): qty 2
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
## 2016-10-29 ENCOUNTER — Inpatient Hospital Stay (HOSPITAL_COMMUNITY)
Admission: RE | Admit: 2016-10-29 | Discharge: 2016-11-05 | DRG: 057 | Disposition: A | Discharge status: Home / Self Care | Payer: Medicare Other | Source: Intra-hospital | Attending: Physical Medicine & Rehabilitation | Admitting: Physical Medicine & Rehabilitation

## 2016-10-29 ENCOUNTER — Other Ambulatory Visit: Payer: Self-pay

## 2016-10-29 DIAGNOSIS — Z953 Presence of xenogenic heart valve: Secondary | ICD-10-CM

## 2016-10-29 DIAGNOSIS — I4819 Other persistent atrial fibrillation: Secondary | ICD-10-CM

## 2016-10-29 DIAGNOSIS — Z79899 Other long term (current) drug therapy: Secondary | ICD-10-CM | POA: Diagnosis not present

## 2016-10-29 DIAGNOSIS — Z882 Allergy status to sulfonamides status: Secondary | ICD-10-CM

## 2016-10-29 DIAGNOSIS — N400 Enlarged prostate without lower urinary tract symptoms: Secondary | ICD-10-CM

## 2016-10-29 DIAGNOSIS — I10 Essential (primary) hypertension: Secondary | ICD-10-CM | POA: Diagnosis present

## 2016-10-29 DIAGNOSIS — I639 Cerebral infarction, unspecified: Secondary | ICD-10-CM

## 2016-10-29 DIAGNOSIS — I69391 Dysphagia following cerebral infarction: Secondary | ICD-10-CM

## 2016-10-29 DIAGNOSIS — E876 Hypokalemia: Secondary | ICD-10-CM | POA: Diagnosis present

## 2016-10-29 DIAGNOSIS — G8194 Hemiplegia, unspecified affecting left nondominant side: Secondary | ICD-10-CM

## 2016-10-29 DIAGNOSIS — N401 Enlarged prostate with lower urinary tract symptoms: Secondary | ICD-10-CM

## 2016-10-29 DIAGNOSIS — E119 Type 2 diabetes mellitus without complications: Secondary | ICD-10-CM

## 2016-10-29 DIAGNOSIS — Z7982 Long term (current) use of aspirin: Secondary | ICD-10-CM

## 2016-10-29 DIAGNOSIS — I481 Persistent atrial fibrillation: Secondary | ICD-10-CM | POA: Diagnosis present

## 2016-10-29 DIAGNOSIS — Z8249 Family history of ischemic heart disease and other diseases of the circulatory system: Secondary | ICD-10-CM | POA: Diagnosis not present

## 2016-10-29 DIAGNOSIS — Z8673 Personal history of transient ischemic attack (TIA), and cerebral infarction without residual deficits: Secondary | ICD-10-CM | POA: Diagnosis present

## 2016-10-29 DIAGNOSIS — I480 Paroxysmal atrial fibrillation: Secondary | ICD-10-CM

## 2016-10-29 DIAGNOSIS — Z961 Presence of intraocular lens: Secondary | ICD-10-CM | POA: Diagnosis present

## 2016-10-29 DIAGNOSIS — R739 Hyperglycemia, unspecified: Secondary | ICD-10-CM | POA: Diagnosis not present

## 2016-10-29 DIAGNOSIS — I4821 Permanent atrial fibrillation: Secondary | ICD-10-CM

## 2016-10-29 DIAGNOSIS — R7303 Prediabetes: Secondary | ICD-10-CM | POA: Diagnosis present

## 2016-10-29 DIAGNOSIS — I69398 Other sequelae of cerebral infarction: Secondary | ICD-10-CM | POA: Diagnosis not present

## 2016-10-29 DIAGNOSIS — R9431 Abnormal electrocardiogram [ECG] [EKG]: Secondary | ICD-10-CM

## 2016-10-29 DIAGNOSIS — I4891 Unspecified atrial fibrillation: Secondary | ICD-10-CM | POA: Diagnosis present

## 2016-10-29 DIAGNOSIS — Z9842 Cataract extraction status, left eye: Secondary | ICD-10-CM | POA: Diagnosis not present

## 2016-10-29 DIAGNOSIS — R269 Unspecified abnormalities of gait and mobility: Secondary | ICD-10-CM | POA: Diagnosis not present

## 2016-10-29 DIAGNOSIS — I69354 Hemiplegia and hemiparesis following cerebral infarction affecting left non-dominant side: Secondary | ICD-10-CM | POA: Diagnosis present

## 2016-10-29 DIAGNOSIS — R1310 Dysphagia, unspecified: Secondary | ICD-10-CM | POA: Diagnosis present

## 2016-10-29 DIAGNOSIS — I63511 Cerebral infarction due to unspecified occlusion or stenosis of right middle cerebral artery: Secondary | ICD-10-CM | POA: Diagnosis present

## 2016-10-29 DIAGNOSIS — Z9841 Cataract extraction status, right eye: Secondary | ICD-10-CM

## 2016-10-29 DIAGNOSIS — Z952 Presence of prosthetic heart valve: Secondary | ICD-10-CM

## 2016-10-29 LAB — CBC
HCT: 33.1 % — ABNORMAL LOW (ref 39.0–52.0)
Hemoglobin: 11.3 g/dL — ABNORMAL LOW (ref 13.0–17.0)
MCH: 32.5 pg (ref 26.0–34.0)
MCHC: 34.1 g/dL (ref 30.0–36.0)
MCV: 95.1 fL (ref 78.0–100.0)
Platelets: 111 10*3/uL — ABNORMAL LOW (ref 150–400)
RBC: 3.48 MIL/uL — ABNORMAL LOW (ref 4.22–5.81)
RDW: 13.6 % (ref 11.5–15.5)
WBC: 6.6 10*3/uL (ref 4.0–10.5)

## 2016-10-29 LAB — GLUCOSE, CAPILLARY
Glucose-Capillary: 115 mg/dL — ABNORMAL HIGH (ref 65–99)
Glucose-Capillary: 132 mg/dL — ABNORMAL HIGH (ref 65–99)
Glucose-Capillary: 98 mg/dL (ref 65–99)
Glucose-Capillary: 101 mg/dL — ABNORMAL HIGH (ref 65–99)

## 2016-10-29 LAB — BASIC METABOLIC PANEL
Anion gap: 8 (ref 5–15)
BUN: 8 mg/dL (ref 6–20)
CO2: 20 mmol/L — ABNORMAL LOW (ref 22–32)
Calcium: 8.6 mg/dL — ABNORMAL LOW (ref 8.9–10.3)
Chloride: 110 mmol/L (ref 101–111)
Creatinine, Ser: 0.68 mg/dL (ref 0.61–1.24)
GFR calc Af Amer: >60 mL/min (ref >60)
GFR calc non Af Amer: >60 mL/min (ref >60)
Glucose, Bld: 103 mg/dL — ABNORMAL HIGH (ref 65–99)
Potassium: 3.9 mmol/L (ref 3.5–5.1)
Sodium: 138 mmol/L (ref 135–145)

## 2016-10-29 MED ORDER — ONDANSETRON HCL 4 MG/2ML IJ SOLN: 4.0000 mg | Freq: Four times a day (QID) | INTRAMUSCULAR | Status: DC | PRN

## 2016-10-29 MED ORDER — BISACODYL 10 MG RE SUPP: 10.0000 mg | Freq: Every day | RECTAL | Status: DC | PRN

## 2016-10-29 MED ORDER — SORBITOL 70 % SOLN: 30.0000 mL | Freq: Every day | Status: DC | PRN

## 2016-10-29 MED ORDER — FINASTERIDE 5 MG PO TABS
5.0000 mg | ORAL_TABLET | Freq: Every day | ORAL | Status: DC
  Administered 2016-10-30 – 2016-11-05 (×7): 5 mg via ORAL
  Filled 2016-10-29 (×7): qty 1

## 2016-10-29 MED ORDER — BACLOFEN 10 MG PO TABS
10.0000 mg | ORAL_TABLET | Freq: Three times a day (TID) | ORAL | Status: DC
  Administered 2016-10-29 – 2016-11-01 (×5): 10 mg via ORAL
  Filled 2016-10-29 (×6): qty 1

## 2016-10-29 MED ORDER — BACLOFEN 10 MG PO TABS
10.0000 mg | ORAL_TABLET | Freq: Three times a day (TID) | ORAL | Status: DC
  Administered 2016-10-29: 10 mg via ORAL
  Filled 2016-10-29: qty 1

## 2016-10-29 MED ORDER — ATENOLOL 25 MG PO TABS
25.0000 mg | ORAL_TABLET | Freq: Every day | ORAL | Status: DC
  Administered 2016-10-30 – 2016-11-05 (×7): 25 mg via ORAL
  Filled 2016-10-29 (×8): qty 1

## 2016-10-29 MED ORDER — SENNOSIDES-DOCUSATE SODIUM 8.6-50 MG PO TABS: 1.0000 | ORAL_TABLET | Freq: Every evening | ORAL | Status: DC | PRN

## 2016-10-29 MED ORDER — ATENOLOL 25 MG PO TABS: 25.0000 mg | ORAL_TABLET | Freq: Every day | ORAL | Status: DC

## 2016-10-29 MED ORDER — TAMSULOSIN HCL 0.4 MG PO CAPS
0.8000 mg | ORAL_CAPSULE | Freq: Every day | ORAL | Status: DC
  Administered 2016-10-30 – 2016-11-05 (×7): 0.8 mg via ORAL
  Filled 2016-10-29 (×7): qty 2

## 2016-10-29 MED ORDER — INSULIN ASPART 100 UNIT/ML ~~LOC~~ SOLN
0.0000 [IU] | Freq: Three times a day (TID) | SUBCUTANEOUS | Status: DC
  Administered 2016-10-31 – 2016-11-04 (×4): 1 [IU] via SUBCUTANEOUS

## 2016-10-29 MED ORDER — ACETAMINOPHEN 325 MG PO TABS: 650.0000 mg | ORAL_TABLET | ORAL | Status: DC | PRN

## 2016-10-29 MED ORDER — APIXABAN 5 MG PO TABS: 5.0000 mg | ORAL_TABLET | Freq: Two times a day (BID) | ORAL | Status: DC

## 2016-10-29 MED ORDER — AMLODIPINE BESYLATE 10 MG PO TABS
10.0000 mg | ORAL_TABLET | Freq: Every day | ORAL | Status: DC
  Administered 2016-10-30 – 2016-11-05 (×7): 10 mg via ORAL
  Filled 2016-10-29 (×7): qty 1

## 2016-10-29 MED ORDER — APIXABAN 5 MG PO TABS
5.0000 mg | ORAL_TABLET | Freq: Two times a day (BID) | ORAL | Status: DC
  Administered 2016-10-29: 5 mg via ORAL
  Filled 2016-10-29 (×2): qty 1

## 2016-10-29 MED ORDER — ONDANSETRON HCL 4 MG PO TABS: 4.0000 mg | ORAL_TABLET | Freq: Four times a day (QID) | ORAL | Status: DC | PRN

## 2016-10-29 NOTE — Plan of Care (Signed)
Problem: RH KNOWLEDGE DEFICIT Goal: RH STG INCREASE KNOWLEDGE OF DYSPHAGIA/FLUID INTAKE Patient able to verbalize and demonstrate foods okay for order dysphagia and fluid consistence with mod assistance

## 2016-10-29 NOTE — Progress Notes (Signed)
Inpatient Rehabilitation  Met with patient and spouse to answerer questions about our IP Rehab program.   They are eager for patient to regain his independence and active lifestyle.  I have insurance authorization and will proceed with admitting patient today.  Updated team.  Call if questions.   Carmelia Roller., CCC/SLP Admission Coordinator  [facility]  Cell [phone]

## 2016-10-29 NOTE — Progress Notes (Signed)
Patient and family were informed about rehab process ncluding patient safety plan and rehab booklet

## 2016-10-29 NOTE — Care Management Important Message (Signed)
Important Message  Patient Details  Name: [name] MRN: [id number] Date of Birth: 1935-12-26   Medicare Important Message Given:  Yes    Lycia Sachdeva Abena 10/29/2016, 11:14 AM

## 2016-10-29 NOTE — Progress Notes (Signed)
Inpatient Rehabilitation  I have submitted weekend updates to [facility] and await hopeful authorization for an IP Rehab admission later today.  Plan to follow up with the team when I know.  Call if questions.   Carmelia Roller., CCC/SLP Admission Coordinator  [place]  Cell [phone]

## 2016-10-29 NOTE — Progress Notes (Signed)
Meredith Staggers, MD Physician Signed Physical Medicine and Rehabilitation  Consult Note Date of Service: 10/26/2016 1:54 PM  Related encounter: ED to Hosp-Admission (Current) from 10/24/2016 in [facility]     Expand All Collapse All   [] Hide copied text [] Hover for attribution information      Physical Medicine and Rehabilitation Consult Reason for Consult: Decreased functional mobility Referring Physician: Dr. Leonie Man   HPI: [name] is a 81 y.o. right handed male with history of CAD, aortic valve replacement maintain on aspirin, hypertension. Per chart review patient lives with spouse independent prior to admission. 2 level home with bedroom on main level XIV steps to entry. Presented 10/24/2016 with altered mental status aphasia and right gaze deviation. CT/MRI showed acute infarction primarily in the right basal ganglia. Acute infarct also in the insula on the right and scattered tiny areas of acute infarction of the cerebral cortex bilaterally right greater than left. CT angiogram head and neck showed right M1 occlusion extending into both superior and inferior M2 branches. No evidence of craniocervical dissection. Underwent revascularization per interventional radiology. Echocardiogram is pending. Neurology consulted with workup ongoing presently maintained on aspirin therapy. Dysphagia #3 nectar thick liquid diet. Physical therapy evaluation completed with recommendations of physical medicine rehabilitation consult.   Review of Systems  Constitutional: Negative for chills and fever.  HENT: Negative for hearing loss.   Eyes: Negative for blurred vision.  Respiratory: Negative for cough and shortness of breath.   Cardiovascular: Positive for palpitations and leg swelling. Negative for chest pain.  Gastrointestinal: Positive for constipation. Negative for nausea and vomiting.  Genitourinary: Positive for urgency. Negative for dysuria, flank pain and  hematuria.  Musculoskeletal: Positive for joint pain and myalgias.  Skin: Negative for rash.  Neurological: Positive for speech change and focal weakness. Negative for seizures.  All other systems reviewed and are negative.      Past Medical History:  Diagnosis Date  . Bladder stones   . Borderline diabetes   . BPH (benign prostatic hyperplasia)   . Coronary artery disease    cardiologist-  dr Nat Math gerhart NP--- per cath 06-02-2010 non-obstructive cad pLAD 30-40%  . Diverticulosis of colon   . DOE (dyspnea on exertion)   . Gout   . History of adenomatous polyp of colon    2002-- tubular adenoma  . History of aortic insufficiency    severe -- s/p  AVR 08-03-2010  . History of basal cell carcinoma (BCC) excision    10/ 2015  left ear  . History of small bowel obstruction    02/ 2007 mechanical sbo s/p  surgical intervention;  partial sbo 09/ 2011 and 03-20-2011 resolved without surgical intervention  . History of squamous cell carcinoma excision    2014 right foot  . History of urinary retention   . HTN (hypertension)   . Peripheral neuropathy   . S/P aortic valve replacement with prosthetic valve 08/03/2010   tissue valve  . S/P patent foramen ovale closure 08/03/2010   at same time AVR        Past Surgical History:  Procedure Laterality Date  . CARDIAC CATHETERIZATION  06-02-2010  dr Marlou Porch   non-obstructive cad- pLAD 30-40%/  normal LVSF/  severe AI  . CARDIOVASCULAR STRESS TEST  04/12/2016   Low risk nuclear perfusion study w/ no significant reversible ischemia/  normal LV function and wall motion ,  stress ef 60%/  73mm inferior and lateral scooped ST-segment depression  w/ exercise (may be repolarization abnormality), exercise capacity was moderately reduced  . CATARACT EXTRACTION W/ INTRAOCULAR LENS  IMPLANT, BILATERAL  02/2010  . CYSTOSCOPY WITH LITHOLAPAXY N/A 06/05/2016   Procedure: CYSTOSCOPY WITH LITHOLAPAXY and fulgarization  of bladder neck;  Surgeon: Irine Seal, MD;  Location: [facility]  Service: Urology;  Laterality: N/A;  . EXPLORATORY LAPARTOMY /  CHOLECYSTECTOMY  02/28/2005   for Small  bowel obstruction (mechnical)  . IR ANGIO EXTRACRAN SEL COM CAROTID INNOMINATE UNI L MOD SED  10/24/2016  . IR ANGIO VERTEBRAL SEL SUBCLAVIAN INNOMINATE BILAT MOD SED  10/24/2016  . IR PERCUTANEOUS ART THROMBECTOMY/INFUSION INTRACRANIAL INC DIAG ANGIO  10/24/2016  . LEFT KNEE ARTHROSCOPY  2006  . RADIOLOGY WITH ANESTHESIA N/A 10/24/2016   Procedure: RADIOLOGY WITH ANESTHESIA;  Surgeon: Luanne Bras, MD;  Location: [facility];  Service: Radiology;  Laterality: N/A;  . RIGHT FOOT SURGERY    . RIGHT MINIATURE ANTERIOR THORACOTOMY FOR AORTIC VALVE REPLACEMENT AND CLOSURE PATENT FORAMEN OVALE  08-03-2010  DR Levada Schilling Magna-ease pericardial tissue valve (58mm)  . TRANSTHORACIC ECHOCARDIOGRAM  05/30/2016  dr skains   moderate  LVH ef 60-65%/  bioprothesis aortic valve present ,normal grandient and no AI /  mild MV calcification , moderate MR /  mild PR/ moderate TR/  PASP 31mmHg/ (RA denisty was identified 04-27-2016 echo) and is seen again today, this is likely a promient eustacian ridge, atrium is normal size        Family History  Problem Relation Age of Onset  . Heart disease Mother   . Brain cancer Father    Social History:  reports that he has never smoked. He has never used smokeless tobacco. He reports that he drinks alcohol. He reports that he does not use drugs. Allergies:       Allergies  Allergen Reactions  . Sulfa Antibiotics Other (See Comments)    Granulocytosis         Medications Prior to Admission  Medication Sig Dispense Refill  . allopurinol (ZYLOPRIM) 300 MG tablet Take 300 mg by mouth daily.      Marland Kitchen amLODipine (NORVASC) 5 MG tablet Take 5 mg by mouth daily.     Marland Kitchen aspirin 81 MG tablet Take 81 mg by mouth daily.     Marland Kitchen atenolol (TENORMIN) 50 MG tablet  Take 50 mg by mouth daily.      Marland Kitchen b complex vitamins tablet Take 1 tablet by mouth daily.    . finasteride (PROSCAR) 5 MG tablet Take 5 mg by mouth daily.      . tamsulosin (FLOMAX) 0.4 MG CAPS capsule Take 0.8 mg by mouth daily after breakfast.     . amoxicillin (AMOXIL) 500 MG capsule Take 2,000 mg by mouth as needed (1 hour prior before dental procedures).       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 - [employer]  Lives With: Spouse, Son  Functional History: Prior Function Level of Independence: Independent Functional Status:  Mobility: Bed Mobility Overal bed mobility: Needs Assistance Bed Mobility: Supine to Sit Supine to sit: Min assist General bed mobility comments: min HHA to fully elevate trunk,  cues for sequence and increased time from flat bed Transfers Overall transfer level: Needs assistance Transfers: Sit to/from Stand Sit to Stand: Min assist General transfer comment: min assist to rise and balance Ambulation/Gait Ambulation/Gait assistance: Min assist, Mod assist Ambulation Distance (Feet): 70 Feet Assistive device: 2 person hand held assist Gait Pattern/deviations: Shuffle, Trunk flexed General Gait Details: pt with shuffling gait with increased posterior left lean with fatigue with pt requiring min assist initially and progressing to mod assist with fatigue Gait velocity interpretation: Below normal speed for age/gender  ADL:  Cognition: Cognition Overall Cognitive Status: Impaired/Different from baseline Arousal/Alertness:  Awake/alert Orientation Level: Oriented to person, Oriented to place, Oriented to situation, Disoriented to time (1918) Attention: Sustained Sustained Attention: Appears intact Memory: Appears intact (for 3 and 5 word retention) Awareness: Appears intact Safety/Judgment: Other (comment) Cognition Arousal/Alertness: Awake/alert Behavior During Therapy: Flat affect Overall Cognitive Status: Impaired/Different from baseline Area of Impairment: Safety/judgement Safety/Judgement: Decreased awareness of deficits  Blood pressure (!) 141/79, pulse 73, temperature 98.4 F (36.9 C), temperature source Oral, resp. rate 20, height 6' (1.829 m), weight 88.7 kg (195 lb 8.8 oz), SpO2 94 %. Physical Exam  Vitals reviewed. Constitutional: He is oriented to person, place, and time.  HENT:  Head: Normocephalic.  Eyes: EOM are normal.  Neck: Normal range of motion. Neck supple. No thyromegaly present.  Cardiovascular: Normal rate, regular rhythm and normal heart sounds.   Respiratory: Effort normal and breath sounds normal. No respiratory distress.  GI: Soft. Bowel sounds are normal. He exhibits no distension.  Neurological: He is alert and oriented to person, place, and time.  Speech is dysarthric but intelligible with left central 7. mild left inattention. lethargic. He does follow commands. Fair awareness of deficits. RUE 5/5. LUE 4/5 prox to distal with left pronator drift. RLE 4/5 prox to distal. LLE: 3/5 HF, KE and 3/5 ADF/PF. Senses pain and light touch in all 4's.   Skin: Skin is warm and dry.  Psychiatric: He has a normal mood and affect. His behavior is normal. Thought content normal.    Lab Results Last 24 Hours       Results for orders placed or performed during the hospital encounter of 10/24/16 (from the past 24 hour(s))  Glucose, capillary     Status: Abnormal   Collection Time: 10/25/16  4:05 PM  Result Value Ref Range   Glucose-Capillary 119 (H) 65 - 99 mg/dL  TSH     Status:  None   Collection Time: 10/25/16  4:44 PM  Result Value Ref Range   TSH 0.834 0.350 - 4.500 uIU/mL  Glucose, capillary     Status: Abnormal   Collection Time: 10/25/16  8:23 PM  Result Value Ref Range   Glucose-Capillary 130 (H) 65 - 99 mg/dL   Comment 1 Notify RN    Comment 2 Document in Chart   Glucose, capillary     Status: Abnormal   Collection Time: 10/25/16 11:38 PM  Result Value Ref Range   Glucose-Capillary 125 (H) 65 - 99 mg/dL   Comment 1 Notify RN    Comment 2 Document in Chart   Glucose, capillary     Status: Abnormal   Collection Time: 10/26/16  4:02 AM  Result Value Ref Range   Glucose-Capillary 116 (H) 65 - 99 mg/dL  Magnesium     Status: None   Collection Time: 10/26/16  4:17 AM  Result Value Ref Range   Magnesium 1.9 1.7 - 2.4 mg/dL  Phosphorus  Status: Abnormal   Collection Time: 10/26/16  4:17 AM  Result Value Ref Range   Phosphorus 2.2 (L) 2.5 - 4.6 mg/dL  Glucose, capillary     Status: Abnormal   Collection Time: 10/26/16  7:54 AM  Result Value Ref Range   Glucose-Capillary 107 (H) 65 - 99 mg/dL  Basic metabolic panel     Status: Abnormal   Collection Time: 10/26/16  9:38 AM  Result Value Ref Range   Sodium 139 135 - 145 mmol/L   Potassium 4.0 3.5 - 5.1 mmol/L   Chloride 115 (H) 101 - 111 mmol/L   CO2 19 (L) 22 - 32 mmol/L   Glucose, Bld 121 (H) 65 - 99 mg/dL   BUN 12 6 - 20 mg/dL   Creatinine, Ser 0.80 0.61 - 1.24 mg/dL   Calcium 8.0 (L) 8.9 - 10.3 mg/dL   GFR calc non Af Amer >60 >60 mL/min   GFR calc Af Amer >60 >60 mL/min   Anion gap 5 5 - 15  CBC     Status: Abnormal   Collection Time: 10/26/16  9:38 AM  Result Value Ref Range   WBC 8.0 4.0 - 10.5 K/uL   RBC 3.32 (L) 4.22 - 5.81 MIL/uL   Hemoglobin 10.7 (L) 13.0 - 17.0 g/dL   HCT 32.3 (L) 39.0 - 52.0 %   MCV 97.3 78.0 - 100.0 fL   MCH 32.2 26.0 - 34.0 pg   MCHC 33.1 30.0 - 36.0 g/dL   RDW 13.8 11.5 - 15.5 %   Platelets 88 (L) 150 - 400  K/uL  Glucose, capillary     Status: Abnormal   Collection Time: 10/26/16 12:30 PM  Result Value Ref Range   Glucose-Capillary 138 (H) 65 - 99 mg/dL      Imaging Results (Last 48 hours)  Ct Angio Head W Or Wo Contrast  Result Date: 10/24/2016 CLINICAL DATA:  Acute onset of LEFT-sided weakness. EXAM: CT ANGIOGRAPHY HEAD AND NECK TECHNIQUE: Multidetector CT imaging of the head and neck was performed using the standard protocol during bolus administration of intravenous contrast. Multiplanar CT image reconstructions and MIPs were obtained to evaluate the vascular anatomy. Carotid stenosis measurements (when applicable) are obtained utilizing NASCET criteria, using the distal internal carotid diameter as the denominator. CONTRAST:  50 mL Isovue 370. COMPARISON:  CT head earlier today. CT perfusion reported separately. FINDINGS: CTA NECK Aortic arch: Standard branching. Imaged portion shows no evidence of aneurysm or dissection. No significant stenosis of the major arch vessel origins. Right carotid system: Unremarkable. No evidence of dissection, stenosis (50% or greater) or occlusion. Left carotid system: Unremarkable. No evidence of dissection, stenosis (50% or greater) or occlusion. Vertebral arteries: Codominant. No evidence of dissection, stenosis (50% or greater) or occlusion. Nonvascular soft tissues: Lung apices clear. No neck masses. Cervical spondylosis. CTA HEAD Anterior circulation: Calcific atheromatous disease affecting the cavernous and supraclinoid internal carotid arteries without skull base stenosis. LEFT anterior circulation normal. RIGHT anterior circulation demonstrates abrupt occlusion of the mid to distal M1, and both M2 branches, of the RIGHT middle cerebral artery. There is a paucity of collaterals filling the M3 branches of the RIGHT hemisphere. Posterior circulation: Basilar artery widely patent. Both vertebrals contribute to its formation. No cerebellar branch or PCA disease  of significance. Venous sinuses: As permitted by contrast timing, patent. Anatomic variants: None of significance. Delayed phase:   No abnormal intracranial enhancement. Review of the MIP images confirms the above findings IMPRESSION: RIGHT M1 occlusion extending  into both superior and inferior M2 branches. Significant diminution of flow into the RIGHT hemisphere, separately quantitated and described on CT perfusion study. No extracranial stenosis of significance. No evidence of craniocervical dissection. Electronically Signed   By: Staci Righter M.D.   On: 10/24/2016 15:57   Ct Head Wo Contrast  Result Date: 10/24/2016 CLINICAL DATA:  Stroke follow-up. EXAM: CT HEAD WITHOUT CONTRAST TECHNIQUE: Contiguous axial images were obtained from the base of the skull through the vertex without intravenous contrast. COMPARISON:  Earlier today FINDINGS: Brain: Infarcts seen along the right insula and possibly posterior putamen. Negative for postprocedural hemorrhage. Small right cerebellar infarct. Vascular: Intravascular contrast from recent procedure. Vessel density is symmetric. Skull: No acute finding Sinuses/Orbits: Mucosal thickening in the paranasal sinuses. IMPRESSION: Small volume acute infarct seen in the right insula and possibly putamen. No postprocedural hemorrhage. Electronically Signed   By: Monte Fantasia M.D.   On: 10/24/2016 16:00   Ct Angio Neck W Or Wo Contrast  Result Date: 10/24/2016 CLINICAL DATA:  Acute onset of LEFT-sided weakness. EXAM: CT ANGIOGRAPHY HEAD AND NECK TECHNIQUE: Multidetector CT imaging of the head and neck was performed using the standard protocol during bolus administration of intravenous contrast. Multiplanar CT image reconstructions and MIPs were obtained to evaluate the vascular anatomy. Carotid stenosis measurements (when applicable) are obtained utilizing NASCET criteria, using the distal internal carotid diameter as the denominator. CONTRAST:  50 mL Isovue 370.  COMPARISON:  CT head earlier today. CT perfusion reported separately. FINDINGS: CTA NECK Aortic arch: Standard branching. Imaged portion shows no evidence of aneurysm or dissection. No significant stenosis of the major arch vessel origins. Right carotid system: Unremarkable. No evidence of dissection, stenosis (50% or greater) or occlusion. Left carotid system: Unremarkable. No evidence of dissection, stenosis (50% or greater) or occlusion. Vertebral arteries: Codominant. No evidence of dissection, stenosis (50% or greater) or occlusion. Nonvascular soft tissues: Lung apices clear. No neck masses. Cervical spondylosis. CTA HEAD Anterior circulation: Calcific atheromatous disease affecting the cavernous and supraclinoid internal carotid arteries without skull base stenosis. LEFT anterior circulation normal. RIGHT anterior circulation demonstrates abrupt occlusion of the mid to distal M1, and both M2 branches, of the RIGHT middle cerebral artery. There is a paucity of collaterals filling the M3 branches of the RIGHT hemisphere. Posterior circulation: Basilar artery widely patent. Both vertebrals contribute to its formation. No cerebellar branch or PCA disease of significance. Venous sinuses: As permitted by contrast timing, patent. Anatomic variants: None of significance. Delayed phase:   No abnormal intracranial enhancement. Review of the MIP images confirms the above findings IMPRESSION: RIGHT M1 occlusion extending into both superior and inferior M2 branches. Significant diminution of flow into the RIGHT hemisphere, separately quantitated and described on CT perfusion study. No extracranial stenosis of significance. No evidence of craniocervical dissection. Electronically Signed   By: Staci Righter M.D.   On: 10/24/2016 15:57   [name]  Result Date: 10/25/2016 CLINICAL DATA:  Stroke.  Post endovascular clot or cerebral and tPA. EXAM: MRI HEAD WITHOUT CONTRAST TECHNIQUE: Multiplanar, multiecho  pulse sequences of the brain and surrounding structures were obtained without intravenous contrast. COMPARISON:  CT head 10/24/2016. FINDINGS: Brain: Acute infarct in the right basal ganglia involving the putamen and body and tail of caudate. Acute infarct in the right insula. Small areas of acute infarct in the right frontal and parietal cortex and in the left occipital cortex. Small acute infarct right occipital cortex. Ventricle size normal. Bilateral subdural hygromas unchanged from  yesterday. No prior baseline study. These may be chronic. Low negative for hydrocephalus. Tiny focus of hemorrhage in the right parietal white matter likely chronic. Small amount of hemorrhage in the right putamen. No shift of the midline structures Vascular: Normal arterial flow void Skull and upper cervical spine: Negative Sinuses/Orbits: Mild mucosal edema paranasal sinuses. Bilateral lens replacement Other: None IMPRESSION: Acute infarct primarily in the right basal ganglia. Acute infarct also in the insula on the right and scattered tiny areas of acute infarct in the cerebral cortex bilaterally right greater than left. Small amount of hemorrhage in the right putamen. No shift of the midline structures. Electronically Signed   By: Franchot Gallo M.D.   On: 10/25/2016 11:05   Ct Cerebral Perfusion W Contrast  Result Date: 10/24/2016 CLINICAL DATA:  LEFT-sided weakness. EXAM: CT PERFUSION BRAIN TECHNIQUE: Multiphase CT imaging of the brain was performed following IV bolus contrast injection. Subsequent parametric perfusion maps were calculated using RAPID software. CONTRAST:  40 mL Isovue 370 COMPARISON:  CTA head neck reported separately. FINDINGS: CT Brain Perfusion Findings: CBF (<30%) Volume: 6.56mL Perfusion (Tmax>6.0s) volume: 334.11mL Mismatch Volume: 391mL Infarction Location:RIGHT hemisphere, nearly the entire RIGHT MCA territory. IMPRESSION: Large area of potentially reversible ischemia, significant mismatch volume  of 328 mL, nearly the entire RIGHT hemisphere MCA volume, related to a proximal RIGHT MCA emergent large vessel occlusion. Stroke neurologist and Neurointerventional team aware. Electronically Signed   By: Staci Righter M.D.   On: 10/24/2016 16:00   Portable Chest Xray  Result Date: 10/25/2016 CLINICAL DATA:  Stroke. EXAM: PORTABLE CHEST 1 VIEW COMPARISON:  10/24/2016 FINDINGS: Endotracheal tube remains in satisfactory position and unchanged. Gastric tube coiled in the stomach. Cardiac enlargement with aortic valve replacement. Negative for heart failure. Mild bibasilar atelectasis unchanged. IMPRESSION: No significant change from the prior study. Mild bibasilar atelectasis. Endotracheal tube in satisfactory position. Electronically Signed   By: Franchot Gallo M.D.   On: 10/25/2016 07:16   Portable Chest Xray  Result Date: 10/24/2016 CLINICAL DATA:  Stroke EXAM: PORTABLE CHEST 1 VIEW COMPARISON:  10/02/2010 FINDINGS: Endotracheal tube in good position.  NG tube enters the stomach. Aortic valve replacement. Bibasilar atelectasis left greater than right. Negative for edema IMPRESSION: Endotracheal tube in satisfactory position. Bibasilar atelectasis left greater than right Electronically Signed   By: Franchot Gallo M.D.   On: 10/24/2016 17:43   Dg Abd Portable 1v  Result Date: 10/24/2016 CLINICAL DATA:  Stroke EXAM: PORTABLE ABDOMEN - 1 VIEW COMPARISON:  None. FINDINGS: NG tube coiled in the stomach. Nonobstructive bowel gas pattern. Urinary tract excretion of contrast due to recent angiography. IMPRESSION: NG tube in the stomach.  Normal bowel gas pattern. Electronically Signed   By: Franchot Gallo M.D.   On: 10/24/2016 17:44   Ir Percutaneous Art Thrombectomy/infusion Intracranial Inc Diag Angio  Result Date: 10/26/2016 INDICATION: Acute onset of right gaze deviation. Left sided hemiparesis. Abnormal CT scan of the brain with right middle cerebral artery hyperdense sign and occluded right  middle cerebral artery on CT angiogram. EXAM: 1. EMERGENT LARGE VESSEL OCCLUSION THROMBOLYSIS (anterior CIRCULATION) COMPARISON:  CT angiogram of the head and neck of 10/24/2016. MEDICATIONS: Ancef 2 g IV was administered within 1 hour of the procedure. ANESTHESIA/SEDATION: General anesthesia. CONTRAST:  Isovue 300 85 cc. FLUOROSCOPY TIME:  Fluoroscopy Time: 35 minutes 42 seconds (1764 mGy). COMPLICATIONS: None immediate. TECHNIQUE: Following a full explanation of the procedure along with the potential associated complications, an informed witnessed consent was obtained. The risks of  intracranial hemorrhage of 10%, worsening neurological deficit, ventilator dependency, death and inability to revascularize were all reviewed in detail with the patient's spouse and son. The patient was then put under general anesthesia by the Department of Anesthesiology at [facility]. The right groin was prepped and draped in the usual sterile fashion. Thereafter using modified Seldinger technique, transfemoral access into the right common femoral artery was obtained without difficulty. Over a 0.035 inch guidewire a 5 French Pinnacle sheath was inserted. Through this, and also over a 0.035 inch guidewire a 5 [place] JB 1 catheter was advanced to the aortic arch region and selectively positioned in the right subclavian artery, the right common carotid artery, the left vertebral artery and the left common carotid artery. FINDINGS: The right subclavian arteriogram demonstrates the origin of the right vertebral artery to be normal. The vessel is seen to opacify to the cranial skull base to the level of the right posterior-inferior cerebellar artery. A faint opacification is seen distal to this into the right vertebrobasilar junction. The right common carotid arteriogram demonstrates the right external carotid artery and its major branches to be widely patent. The right internal carotid artery at the bulb to the cranial skull base  opacifies normally. The petrous, the cavernous and the supraclinoid segments are widely patent. The right middle cerebral artery demonstrates complete occlusion in the proximal right M1 segment. The right anterior cerebral artery is seen to opacify distally into the capillary and venous phases. Cross-filling via the anterior communicating artery of the left anterior cerebral artery A2 segment and distally is noted. Also demonstrated is a right posterior communicating artery opacifying the right posterior cerebral artery distribution. A prominent right anterior choroidal artery, a developmental variation is also noted. The left common carotid arteriogram demonstrates the left external carotid artery and its major branches to be widely patent. The left internal carotid artery at the bulb to the cranial skull base opacifies normally. The petrous, the cavernous and the supraclinoid segments are widely patent. A left posterior communicating artery is seen opacifying the left posterior cerebral artery distribution. The left middle cerebral artery and the left anterior cerebral artery opacify into the capillary and venous phases. Prompt cross opacification of the right anterior cerebral A2 segment is noted via the anterior communicating artery. The origin of left vertebral artery demonstrates mild narrowing. The vessel, otherwise, opacifies to the cranial skull base. There is normal opacification of left vertebrobasilar junction. Normal opacification is seen of the left posterior-inferior cerebellar artery and the left vertebrobasilar junction distal to this. The opacified portion of the basilar artery, the left posterior cerebral artery, the superior cerebellar arteries and the anterior-inferior cerebellar artery is grossly normal into the delayed arterial phase. Non-opacified blood is seen in the basilar artery from the contralateral vertebral artery. PROCEDURE: ENDOVASCULAR REVASCULARIZATION OF OCCLUDED RIGHT MIDDLE  CEREBELLAR ARTERY M1 SEGMENT PROXIMALLY WITH MECHANICAL THROMBECTOMY. The diagnostic JB 1 catheter in the right common carotid artery was exchanged over a 0.035 inch 300 cm Rosen exchange guidewire for an 8 French 55 cm Brite tip neurovascular sheath using biplane roadmap technique and constant fluoroscopic guidance. Good aspiration obtained from the side port of the neurovascular sheath. This was then connected to continuous heparinized saline infusion. Over the [facility] exchange guidewire, a 95 cm 8 [place] FlowGate guide catheter which had been prepped with 50% contrast and 50% heparinized saline infusion was advanced and positioned just proximal to the right common carotid bifurcation. The guidewire was removed. Good aspiration obtained from the  hub of the 8 French balloon FlowGate guide catheter. A gentle contrast injection demonstrated no evidence of spasms, dissections or of intraluminal filling defects. Over a 0.035 inch Roadrunner guidewire, the 8 [place] FlowGate guide catheter was then advanced to the junction of the distal and the proximal 1/3 of the right internal carotid artery. The guidewire was removed. Good aspiration obtained from the hub of the 8 [place] FlowGate guide catheter. A gentle contrast injection demonstrated no evidence of spasms, dissections or of intraluminal filling defects. A combination of 5 French 115 cm Catalyst guide catheter inside of which was a Kellogg 021 microcatheter was advanced over a 0.0141 Softip Synchro micro guidewire to the distal end of the 8 [place] FlowGate guide catheter. With the micro guidewire leading with a J-tip configuration, the combination was navigated without difficulty to the supraclinoid right ICA. Using a torque device, access was obtained into the occluded right middle cerebral artery into the M2 M3 region of the superior division. The microcatheter was then advanced into the M2 M3 region of the superior division. The guidewire was removed. Good  aspiration obtained from the hub of the microcatheter. A gentle contrast injection demonstrated slow antegrade flow into the distal circulation. An Embotrap 5 mm x 33 mm retrieval device was then advanced in a coaxial manner and with constant heparinized saline infusion using biplane roadmap technique and constant fluoroscopic guidance to the distal end of the Trevo ProVue 021 microcatheter. The O ring on the delivery microcatheter was then loosened. The distal and the proximal retrieval markers were then aligned appropriately. Thereafter with slight forward traction with the right hand on the delivery micro guidewire, with the left hand the delivery microcatheter was retrieved unsheathing the distal and then the proximal portion of the retrieval device. A control arteriogram performed through the 5 [place] Catalyst guide catheter demonstrated continued occlusion of the right middle cerebral artery M1 segment. The proximal portion of the retrieval device was then captured into the microcatheter. The balloon in the right internal carotid artery of the [facility] guide catheter was then inflated for proximal flow arrest. Thereafter, the combination of the retrieval device, the microcatheter and the 5 [place] Catalyst guide catheter was retrieved as aspiration was applied with a 60 mL syringe at the hub of the 8 [place] FlowGate guide catheter, and a 20 mL syringe at the hub of the 5 [place] Catalyst guide catheter. The triaxial combination was gently retrieved and removed as aspiration was continued with a 60 mL syringe. The retrieval device had a few specks of clot entangled within its interstices. The balloon was then deflated in the proximal right internal carotid artery. Free back bleed of blood was noted at the hub of the [place]. A control arteriogram performed through the [facility] guide catheter in the right internal carotid artery demonstrated continued occlusion of the right middle cerebral artery M1 segment  proximally. A second attempt was then made this time using a Catalyst 5 [place] guide catheter of length 132 cm inside of which was a Trevo ProVue microcatheter again over a 0.014 inch Softip Synchro micro guidewire. As mentioned above this triaxial combination was advanced to the right internal carotid supraclinoid segment. The 5 [place] FlowGate guide catheter was then loaded into the supraclinoid right ICA. Access was obtained to the occluded right middle cerebral artery with the micro guidewire followed by the microcatheter this time into the inferior division of the right middle cerebral artery M2 M3 region. The microcatheter was advanced to this  region. The guidewire was removed. Good aspiration obtained from the hub of the microcatheter. A gentle contrast injection demonstrated antegrade flow at the tip of the microcatheter. A 4 mm x 40 mm Solitaire FR retrieval device was then advanced again with constant heparinized saline infusion to the distal end of the microcatheter. Again the O ring on the delivery microcatheter was loosened. The distal and the proximal markers of the retrieval device were then deployed as mentioned above. A control arteriogram performed through the 5 [place] Catalyst guide catheter in the right internal carotid artery supraclinoid segment demonstrated a TICI 2a reperfusion. With the balloon inflated in the right internal carotid artery for proximal flow arrest, and constant aspiration being applied at the hub of the 8 [place] FlowGate guide catheter with a 60 mL syringe, the combination of the retrieval device, the microcatheter and the 5 [place] Catalyst guide catheter was gently retrieved and removed. More clot was noted on the interstices of the retrieval device. Aspiration was continued as the balloon was deflated in the right internal carotid artery. There continued to be poor aspiration. The FlowGate guide catheter was gently retrieved externally as constant aspiration was applied at  the hub of the 8 [place] catheter. Clots were noted in the hub of the [place], and also in the aspirate as well as in the [facility] guide catheter. Control arteriogram performed through the diagnostic catheter position in the right common carotid artery demonstrated complete angiographic revascularization of the right middle cerebral artery achieving a TICI 3 reperfusion. No evidence of extravasation of contrast or mass-effect of the major vessel was noted intracranially. There continued to be patency of the anterior cerebral arteries, and the right posterior cerebral artery and the right anterior choroidal artery. The 8 French sheath was then retrieved into the abdominal aorta and exchanged over a J-tip guidewire for a 9 [place] Pinnacle sheath. This in turn was connected to continuous heparinized saline infusion. Throughout the procedure, the patient's hemodynamic status and neurological status remained stable. IMPRESSION: Status post endovascular complete revascularization of the occluded right middle cerebral artery with 1 pass using the Embotrap 5 mm x 33 mm retrieval device, and 1 pass with the 4 mm x 40 mm Solitaire FR retrieval device achieving a TICI 3 reperfusion. Groin puncture time 01:53 p.m. TICI 3 reperfusion time 3 p.m. PLAN: Patient transported to the CT scan suite for postprocedural CT scan of the brain. Electronically Signed   By: Luanne Bras M.D.   On: 10/25/2016 20:04   Ir Angio Extracran Sel Com Carotid Innominate Uni L Mod Sed  Result Date: 10/26/2016 INDICATION: Acute onset of right gaze deviation. Left sided hemiparesis. Abnormal CT scan of the brain with right middle cerebral artery hyperdense sign and occluded right middle cerebral artery on CT angiogram. EXAM: 1. EMERGENT LARGE VESSEL OCCLUSION THROMBOLYSIS (anterior CIRCULATION) COMPARISON:  CT angiogram of the head and neck of 10/24/2016. MEDICATIONS: Ancef 2 g IV was administered within 1 hour of the procedure.  ANESTHESIA/SEDATION: General anesthesia. CONTRAST:  Isovue 300 85 cc. FLUOROSCOPY TIME:  Fluoroscopy Time: 35 minutes 42 seconds (1764 mGy). COMPLICATIONS: None immediate. TECHNIQUE: Following a full explanation of the procedure along with the potential associated complications, an informed witnessed consent was obtained. The risks of intracranial hemorrhage of 10%, worsening neurological deficit, ventilator dependency, death and inability to revascularize were all reviewed in detail with the patient's spouse and son. The patient was then put under general anesthesia by the Department of Anesthesiology at [facility].  The right groin was prepped and draped in the usual sterile fashion. Thereafter using modified Seldinger technique, transfemoral access into the right common femoral artery was obtained without difficulty. Over a 0.035 inch guidewire a 5 [place] Pinnacle sheath was inserted. Through this, and also over a 0.035 inch guidewire a 5 [place] JB 1 catheter was advanced to the aortic arch region and selectively positioned in the right subclavian artery, the right common carotid artery, the left vertebral artery and the left common carotid artery. FINDINGS: The right subclavian arteriogram demonstrates the origin of the right vertebral artery to be normal. The vessel is seen to opacify to the cranial skull base to the level of the right posterior-inferior cerebellar artery. A faint opacification is seen distal to this into the right vertebrobasilar junction. The right common carotid arteriogram demonstrates the right external carotid artery and its major branches to be widely patent. The right internal carotid artery at the bulb to the cranial skull base opacifies normally. The petrous, the cavernous and the supraclinoid segments are widely patent. The right middle cerebral artery demonstrates complete occlusion in the proximal right M1 segment. The right anterior cerebral artery is seen to opacify  distally into the capillary and venous phases. Cross-filling via the anterior communicating artery of the left anterior cerebral artery A2 segment and distally is noted. Also demonstrated is a right posterior communicating artery opacifying the right posterior cerebral artery distribution. A prominent right anterior choroidal artery, a developmental variation is also noted. The left common carotid arteriogram demonstrates the left external carotid artery and its major branches to be widely patent. The left internal carotid artery at the bulb to the cranial skull base opacifies normally. The petrous, the cavernous and the supraclinoid segments are widely patent. A left posterior communicating artery is seen opacifying the left posterior cerebral artery distribution. The left middle cerebral artery and the left anterior cerebral artery opacify into the capillary and venous phases. Prompt cross opacification of the right anterior cerebral A2 segment is noted via the anterior communicating artery. The origin of left vertebral artery demonstrates mild narrowing. The vessel, otherwise, opacifies to the cranial skull base. There is normal opacification of left vertebrobasilar junction. Normal opacification is seen of the left posterior-inferior cerebellar artery and the left vertebrobasilar junction distal to this. The opacified portion of the basilar artery, the left posterior cerebral artery, the superior cerebellar arteries and the anterior-inferior cerebellar artery is grossly normal into the delayed arterial phase. Non-opacified blood is seen in the basilar artery from the contralateral vertebral artery. PROCEDURE: ENDOVASCULAR REVASCULARIZATION OF OCCLUDED RIGHT MIDDLE CEREBELLAR ARTERY M1 SEGMENT PROXIMALLY WITH MECHANICAL THROMBECTOMY. The diagnostic JB 1 catheter in the right common carotid artery was exchanged over a 0.035 inch 300 cm Rosen exchange guidewire for an 8 French 55 cm Brite tip neurovascular sheath  using biplane roadmap technique and constant fluoroscopic guidance. Good aspiration obtained from the side port of the neurovascular sheath. This was then connected to continuous heparinized saline infusion. Over the [facility] exchange guidewire, a 95 cm 8 [place] FlowGate guide catheter which had been prepped with 50% contrast and 50% heparinized saline infusion was advanced and positioned just proximal to the right common carotid bifurcation. The guidewire was removed. Good aspiration obtained from the hub of the 8 French balloon FlowGate guide catheter. A gentle contrast injection demonstrated no evidence of spasms, dissections or of intraluminal filling defects. Over a 0.035 inch Roadrunner guidewire, the 8 [place] FlowGate guide catheter was then advanced to the junction  of the distal and the proximal 1/3 of the right internal carotid artery. The guidewire was removed. Good aspiration obtained from the hub of the 8 [place] FlowGate guide catheter. A gentle contrast injection demonstrated no evidence of spasms, dissections or of intraluminal filling defects. A combination of 5 French 115 cm Catalyst guide catheter inside of which was a Kellogg 021 microcatheter was advanced over a 0.0141 Softip Synchro micro guidewire to the distal end of the 8 [place] FlowGate guide catheter. With the micro guidewire leading with a J-tip configuration, the combination was navigated without difficulty to the supraclinoid right ICA. Using a torque device, access was obtained into the occluded right middle cerebral artery into the M2 M3 region of the superior division. The microcatheter was then advanced into the M2 M3 region of the superior division. The guidewire was removed. Good aspiration obtained from the hub of the microcatheter. A gentle contrast injection demonstrated slow antegrade flow into the distal circulation. An Embotrap 5 mm x 33 mm retrieval device was then advanced in a coaxial manner and with constant heparinized  saline infusion using biplane roadmap technique and constant fluoroscopic guidance to the distal end of the Trevo ProVue 021 microcatheter. The O ring on the delivery microcatheter was then loosened. The distal and the proximal retrieval markers were then aligned appropriately. Thereafter with slight forward traction with the right hand on the delivery micro guidewire, with the left hand the delivery microcatheter was retrieved unsheathing the distal and then the proximal portion of the retrieval device. A control arteriogram performed through the 5 [place] Catalyst guide catheter demonstrated continued occlusion of the right middle cerebral artery M1 segment. The proximal portion of the retrieval device was then captured into the microcatheter. The balloon in the right internal carotid artery of the [facility] guide catheter was then inflated for proximal flow arrest. Thereafter, the combination of the retrieval device, the microcatheter and the 5 [place] Catalyst guide catheter was retrieved as aspiration was applied with a 60 mL syringe at the hub of the 8 [place] FlowGate guide catheter, and a 20 mL syringe at the hub of the 5 [place] Catalyst guide catheter. The triaxial combination was gently retrieved and removed as aspiration was continued with a 60 mL syringe. The retrieval device had a few specks of clot entangled within its interstices. The balloon was then deflated in the proximal right internal carotid artery. Free back bleed of blood was noted at the hub of the [place]. A control arteriogram performed through the [facility] guide catheter in the right internal carotid artery demonstrated continued occlusion of the right middle cerebral artery M1 segment proximally. A second attempt was then made this time using a Catalyst 5 [place] guide catheter of length 132 cm inside of which was a Trevo ProVue microcatheter again over a 0.014 inch Softip Synchro micro guidewire. As mentioned above this triaxial  combination was advanced to the right internal carotid supraclinoid segment. The 5 [place] FlowGate guide catheter was then loaded into the supraclinoid right ICA. Access was obtained to the occluded right middle cerebral artery with the micro guidewire followed by the microcatheter this time into the inferior division of the right middle cerebral artery M2 M3 region. The microcatheter was advanced to this region. The guidewire was removed. Good aspiration obtained from the hub of the microcatheter. A gentle contrast injection demonstrated antegrade flow at the tip of the microcatheter. A 4 mm x 40 mm Solitaire FR retrieval device was then advanced again with  constant heparinized saline infusion to the distal end of the microcatheter. Again the O ring on the delivery microcatheter was loosened. The distal and the proximal markers of the retrieval device were then deployed as mentioned above. A control arteriogram performed through the 5 [place] Catalyst guide catheter in the right internal carotid artery supraclinoid segment demonstrated a TICI 2a reperfusion. With the balloon inflated in the right internal carotid artery for proximal flow arrest, and constant aspiration being applied at the hub of the 8 [place] FlowGate guide catheter with a 60 mL syringe, the combination of the retrieval device, the microcatheter and the 5 [place] Catalyst guide catheter was gently retrieved and removed. More clot was noted on the interstices of the retrieval device. Aspiration was continued as the balloon was deflated in the right internal carotid artery. There continued to be poor aspiration. The FlowGate guide catheter was gently retrieved externally as constant aspiration was applied at the hub of the 8 [place] catheter. Clots were noted in the hub of the [address], and also in the aspirate as well as in the [facility] guide catheter. Control arteriogram performed through the diagnostic catheter position in the right common carotid  artery demonstrated complete angiographic revascularization of the right middle cerebral artery achieving a TICI 3 reperfusion. No evidence of extravasation of contrast or mass-effect of the major vessel was noted intracranially. There continued to be patency of the anterior cerebral arteries, and the right posterior cerebral artery and the right anterior choroidal artery. The 8 French sheath was then retrieved into the abdominal aorta and exchanged over a J-tip guidewire for a 9 [place] Pinnacle sheath. This in turn was connected to continuous heparinized saline infusion. Throughout the procedure, the patient's hemodynamic status and neurological status remained stable. IMPRESSION: Status post endovascular complete revascularization of the occluded right middle cerebral artery with 1 pass using the Embotrap 5 mm x 33 mm retrieval device, and 1 pass with the 4 mm x 40 mm Solitaire FR retrieval device achieving a TICI 3 reperfusion. Groin puncture time 01:53 p.m. TICI 3 reperfusion time 3 p.m. PLAN: Patient transported to the CT scan suite for postprocedural CT scan of the brain. Electronically Signed   By: Luanne Bras M.D.   On: 10/25/2016 20:04   Ir Angio Vertebral Sel Subclavian Innominate Bilat Mod Sed  Result Date: 10/26/2016 INDICATION: Acute onset of right gaze deviation. Left sided hemiparesis. Abnormal CT scan of the brain with right middle cerebral artery hyperdense sign and occluded right middle cerebral artery on CT angiogram. EXAM: 1. EMERGENT LARGE VESSEL OCCLUSION THROMBOLYSIS (anterior CIRCULATION) COMPARISON:  CT angiogram of the head and neck of 10/24/2016. MEDICATIONS: Ancef 2 g IV was administered within 1 hour of the procedure. ANESTHESIA/SEDATION: General anesthesia. CONTRAST:  Isovue 300 85 cc. FLUOROSCOPY TIME:  Fluoroscopy Time: 35 minutes 42 seconds (1764 mGy). COMPLICATIONS: None immediate. TECHNIQUE: Following a full explanation of the procedure along with the potential  associated complications, an informed witnessed consent was obtained. The risks of intracranial hemorrhage of 10%, worsening neurological deficit, ventilator dependency, death and inability to revascularize were all reviewed in detail with the patient's spouse and son. The patient was then put under general anesthesia by the Department of Anesthesiology at [facility]. The right groin was prepped and draped in the usual sterile fashion. Thereafter using modified Seldinger technique, transfemoral access into the right common femoral artery was obtained without difficulty. Over a 0.035 inch guidewire a 5 [place] Pinnacle sheath was inserted. Through this, and  also over a 0.035 inch guidewire a 5 [place] JB 1 catheter was advanced to the aortic arch region and selectively positioned in the right subclavian artery, the right common carotid artery, the left vertebral artery and the left common carotid artery. FINDINGS: The right subclavian arteriogram demonstrates the origin of the right vertebral artery to be normal. The vessel is seen to opacify to the cranial skull base to the level of the right posterior-inferior cerebellar artery. A faint opacification is seen distal to this into the right vertebrobasilar junction. The right common carotid arteriogram demonstrates the right external carotid artery and its major branches to be widely patent. The right internal carotid artery at the bulb to the cranial skull base opacifies normally. The petrous, the cavernous and the supraclinoid segments are widely patent. The right middle cerebral artery demonstrates complete occlusion in the proximal right M1 segment. The right anterior cerebral artery is seen to opacify distally into the capillary and venous phases. Cross-filling via the anterior communicating artery of the left anterior cerebral artery A2 segment and distally is noted. Also demonstrated is a right posterior communicating artery opacifying the right  posterior cerebral artery distribution. A prominent right anterior choroidal artery, a developmental variation is also noted. The left common carotid arteriogram demonstrates the left external carotid artery and its major branches to be widely patent. The left internal carotid artery at the bulb to the cranial skull base opacifies normally. The petrous, the cavernous and the supraclinoid segments are widely patent. A left posterior communicating artery is seen opacifying the left posterior cerebral artery distribution. The left middle cerebral artery and the left anterior cerebral artery opacify into the capillary and venous phases. Prompt cross opacification of the right anterior cerebral A2 segment is noted via the anterior communicating artery. The origin of left vertebral artery demonstrates mild narrowing. The vessel, otherwise, opacifies to the cranial skull base. There is normal opacification of left vertebrobasilar junction. Normal opacification is seen of the left posterior-inferior cerebellar artery and the left vertebrobasilar junction distal to this. The opacified portion of the basilar artery, the left posterior cerebral artery, the superior cerebellar arteries and the anterior-inferior cerebellar artery is grossly normal into the delayed arterial phase. Non-opacified blood is seen in the basilar artery from the contralateral vertebral artery. PROCEDURE: ENDOVASCULAR REVASCULARIZATION OF OCCLUDED RIGHT MIDDLE CEREBELLAR ARTERY M1 SEGMENT PROXIMALLY WITH MECHANICAL THROMBECTOMY. The diagnostic JB 1 catheter in the right common carotid artery was exchanged over a 0.035 inch 300 cm Rosen exchange guidewire for an 8 French 55 cm Brite tip neurovascular sheath using biplane roadmap technique and constant fluoroscopic guidance. Good aspiration obtained from the side port of the neurovascular sheath. This was then connected to continuous heparinized saline infusion. Over the [facility] exchange guidewire, a 95 cm 8  [place] FlowGate guide catheter which had been prepped with 50% contrast and 50% heparinized saline infusion was advanced and positioned just proximal to the right common carotid bifurcation. The guidewire was removed. Good aspiration obtained from the hub of the 8 French balloon FlowGate guide catheter. A gentle contrast injection demonstrated no evidence of spasms, dissections or of intraluminal filling defects. Over a 0.035 inch Roadrunner guidewire, the 8 [place] FlowGate guide catheter was then advanced to the junction of the distal and the proximal 1/3 of the right internal carotid artery. The guidewire was removed. Good aspiration obtained from the hub of the 8 [place] FlowGate guide catheter. A gentle contrast injection demonstrated no evidence of spasms, dissections or of intraluminal filling  defects. A combination of 5 French 115 cm Catalyst guide catheter inside of which was a Kellogg 021 microcatheter was advanced over a 0.0141 Softip Synchro micro guidewire to the distal end of the 8 [place] FlowGate guide catheter. With the micro guidewire leading with a J-tip configuration, the combination was navigated without difficulty to the supraclinoid right ICA. Using a torque device, access was obtained into the occluded right middle cerebral artery into the M2 M3 region of the superior division. The microcatheter was then advanced into the M2 M3 region of the superior division. The guidewire was removed. Good aspiration obtained from the hub of the microcatheter. A gentle contrast injection demonstrated slow antegrade flow into the distal circulation. An Embotrap 5 mm x 33 mm retrieval device was then advanced in a coaxial manner and with constant heparinized saline infusion using biplane roadmap technique and constant fluoroscopic guidance to the distal end of the Trevo ProVue 021 microcatheter. The O ring on the delivery microcatheter was then loosened. The distal and the proximal retrieval markers were  then aligned appropriately. Thereafter with slight forward traction with the right hand on the delivery micro guidewire, with the left hand the delivery microcatheter was retrieved unsheathing the distal and then the proximal portion of the retrieval device. A control arteriogram performed through the 5 [place] Catalyst guide catheter demonstrated continued occlusion of the right middle cerebral artery M1 segment. The proximal portion of the retrieval device was then captured into the microcatheter. The balloon in the right internal carotid artery of the [facility] guide catheter was then inflated for proximal flow arrest. Thereafter, the combination of the retrieval device, the microcatheter and the 5 [place] Catalyst guide catheter was retrieved as aspiration was applied with a 60 mL syringe at the hub of the 8 [place] FlowGate guide catheter, and a 20 mL syringe at the hub of the 5 [place] Catalyst guide catheter. The triaxial combination was gently retrieved and removed as aspiration was continued with a 60 mL syringe. The retrieval device had a few specks of clot entangled within its interstices. The balloon was then deflated in the proximal right internal carotid artery. Free back bleed of blood was noted at the hub of the Tuohy Christopher. A control arteriogram performed through the [facility] guide catheter in the right internal carotid artery demonstrated continued occlusion of the right middle cerebral artery M1 segment proximally. A second attempt was then made this time using a Catalyst 5 [place] guide catheter of length 132 cm inside of which was a Trevo ProVue microcatheter again over a 0.014 inch Softip Synchro micro guidewire. As mentioned above this triaxial combination was advanced to the right internal carotid supraclinoid segment. The 5 [place] FlowGate guide catheter was then loaded into the supraclinoid right ICA. Access was obtained to the occluded right middle cerebral artery with the micro guidewire  followed by the microcatheter this time into the inferior division of the right middle cerebral artery M2 M3 region. The microcatheter was advanced to this region. The guidewire was removed. Good aspiration obtained from the hub of the microcatheter. A gentle contrast injection demonstrated antegrade flow at the tip of the microcatheter. A 4 mm x 40 mm Solitaire FR retrieval device was then advanced again with constant heparinized saline infusion to the distal end of the microcatheter. Again the O ring on the delivery microcatheter was loosened. The distal and the proximal markers of the retrieval device were then deployed as mentioned above. A control arteriogram performed through the 5  [place] Catalyst guide catheter in the right internal carotid artery supraclinoid segment demonstrated a TICI 2a reperfusion. With the balloon inflated in the right internal carotid artery for proximal flow arrest, and constant aspiration being applied at the hub of the 8 [place] FlowGate guide catheter with a 60 mL syringe, the combination of the retrieval device, the microcatheter and the 5 [place] Catalyst guide catheter was gently retrieved and removed. More clot was noted on the interstices of the retrieval device. Aspiration was continued as the balloon was deflated in the right internal carotid artery. There continued to be poor aspiration. The FlowGate guide catheter was gently retrieved externally as constant aspiration was applied at the hub of the 8 [place] catheter. Clots were noted in the hub of the [place], and also in the aspirate as well as in the [employer] guide catheter. Control arteriogram performed through the diagnostic catheter position in the right common carotid artery demonstrated complete angiographic revascularization of the right middle cerebral artery achieving a TICI 3 reperfusion. No evidence of extravasation of contrast or mass-effect of the major vessel was noted intracranially. There continued to be  patency of the anterior cerebral arteries, and the right posterior cerebral artery and the right anterior choroidal artery. The 8 French sheath was then retrieved into the abdominal aorta and exchanged over a J-tip guidewire for a 9 [place] Pinnacle sheath. This in turn was connected to continuous heparinized saline infusion. Throughout the procedure, the patient's hemodynamic status and neurological status remained stable. IMPRESSION: Status post endovascular complete revascularization of the occluded right middle cerebral artery with 1 pass using the Embotrap 5 mm x 33 mm retrieval device, and 1 pass with the 4 mm x 40 mm Solitaire FR retrieval device achieving a TICI 3 reperfusion. Groin puncture time 01:53 p.m. TICI 3 reperfusion time 3 p.m. PLAN: Patient transported to the CT scan suite for postprocedural CT scan of the brain. Electronically Signed   By: Luanne Bras M.D.   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 Does the need for close, 24 hr/day medical supervision in concert with the patient's rehab needs make it unreasonable for this patient to be served in a less intensive setting? Yes 2. Co-Morbidities requiring supervision/potential complications: gout, htn, post-stroke sequelae 3. Due to bladder management, bowel management, safety, skin/wound care, disease management, medication administration, pain management and patient education, does the patient require 24 hr/day rehab nursing? Yes 4. Does the patient require coordinated care of a physician, rehab nurse, PT (1-2 hrs/day, 5 days/week), OT (1-2 hrs/day, 5 days/week) and SLP (1-2 hrs/day, 5 days/week) to address physical and functional deficits in the context of the above medical diagnosis(es)? Yes Addressing deficits in the following areas: balance, endurance, locomotion, strength, transferring, bowel/bladder control, bathing, dressing, feeding, grooming,  toileting, cognition, speech, swallowing and psychosocial support 5. Can the patient actively participate in an intensive therapy program of at least 3 hrs of therapy per day at least 5 days per week? Yes 6. The potential for patient to make measurable gains while on inpatient rehab is excellent 7. Anticipated functional outcomes upon discharge from inpatient rehab are modified independent  with PT, modified independent and supervision with OT, modified independent with SLP. 8. Estimated rehab length of stay to reach the above functional goals is: 13-16 days 9. Anticipated D/C setting: Home 10. Anticipated post D/C treatments: HH therapy and Outpatient therapy 11. Overall Rehab/Functional Prognosis: excellent  RECOMMENDATIONS: This patient's condition  is appropriate for continued rehabilitative care in the following setting: CIR Patient has agreed to participate in recommended program. Yes Note that insurance prior authorization may be required for reimbursement for recommended care.  Comment: Rehab Admissions Coordinator to follow up.  Thanks,  Meredith Staggers, MD, Mellody Drown    Cathlyn Parsons., PA-C 10/26/2016    Revision History                        Routing History

## 2016-10-29 NOTE — Care Management Note (Signed)
Case Management Note  Patient Details  Name: ALEJO BEAMER MRN: [id number] Date of Birth: [birth year]  Subjective/Objective:                    Action/Plan: Pt discharging to CIR today. No further needs per CM.  Expected Discharge Date:  10/29/16               Expected Discharge Plan:  [place]  In-House Referral:     Discharge planning Services  CM Consult  Post Acute Care Choice:    Choice offered to:     DME Arranged:    DME Agency:     HH Arranged:    HH Agency:     Status of Service:  Completed, signed off  If discussed at [employer], dates discussed:    Additional Comments:  Pollie Friar, RN 10/29/2016, 3:01 PM

## 2016-10-29 NOTE — Progress Notes (Signed)
Progress Note  Patient Name: [name] Date of Encounter: 10/29/2016  Primary Cardiologist: Marlou Porch  Subjective   Saw him walking quite briskly in the whall w walker. Left strides are shorter than right, but appears very stable. HR 40s when asleep, 50-60 at rest, 70s with ambulation. No cardiac symptoms, unaware of irregular rhythm. 2.8 second pause at 0650h was asymptomatic (asleep).  Inpatient Medications    Scheduled Meds: . amLODipine  10 mg Oral Daily  . aspirin  325 mg Per NG tube Daily  . atenolol  37.5 mg Oral Daily  . baclofen  5 mg Oral TID  . enoxaparin (LOVENOX) injection  40 mg Subcutaneous Q24H  . finasteride  5 mg Oral Daily  . insulin aspart  0-9 Units Subcutaneous TID AC & HS  . RESOURCE THICKENUP CLEAR   Oral Once  . tamsulosin  0.8 mg Oral QPC breakfast   Continuous Infusions:  PRN Meds: acetaminophen **OR** [DISCONTINUED] acetaminophen (TYLENOL) oral liquid 160 mg/5 mL **OR** [DISCONTINUED] acetaminophen, bisacodyl, labetalol, ondansetron (ZOFRAN) IV, senna-docusate   Vital Signs    Vitals:   10/28/16 2109 10/29/16 0036 10/29/16 0530 10/29/16 1005  BP: 131/86 133/72 130/71 (!) 142/91  Pulse: 64 69 72 74  Resp: 18 18 18 18   Temp: 98 F (36.7 C) 98 F (36.7 C) 97.8 F (36.6 C) 98.3 F (36.8 C)  TempSrc: Oral Oral Oral Oral  SpO2: 96% 97% 98% 98%  Weight:   182 lb 14.4 oz (83 kg)   Height:        Intake/Output Summary (Last 24 hours) at 10/29/16 1216 Last data filed at 10/29/16 0900  Gross per 24 hour  Intake              520 ml  Output              150 ml  Net              370 ml   Filed Weights   10/27/16 0500 10/28/16 0436 10/29/16 0530  Weight: 196 lb 10.4 oz (89.2 kg) 183 lb 6.4 oz (83.2 kg) 182 lb 14.4 oz (83 kg)    Telemetry    Afib, rate controlled, a couple of very mild pauses 2.2" and 2.8" respectively - Personally Reviewed  ECG    Afib, long QT on 10/19 - Personally Reviewed  Physical Exam  Smiling, eager to  go home GEN: No acute distress.   Neck: No JVD Cardiac: irregular, no murmurs, rubs, or gallops.  Respiratory: Clear to auscultation bilaterally. GI: Soft, nontender, non-distended  MS: No edema; No deformity. Neuro:  Nonfocal  Psych: Normal affect   Labs    Chemistry Recent Labs Lab 10/24/16 1301  10/27/16 0410 10/28/16 0240 10/29/16 0216  NA 138  < > 136 138 138  K 4.0  < > 3.5 3.4* 3.9  CL 111  < > 110 110 110  CO2 20*  < > 18* 19* 20*  GLUCOSE 124*  < > 95 93 103*  BUN 13  < > 10 9 8   CREATININE 0.82  < > 0.71 0.67 0.68  CALCIUM 8.9  < > 7.8* 8.3* 8.6*  PROT 7.0  --   --   --   --   ALBUMIN 4.1  --   --   --   --   AST 28  --   --   --   --   ALT 20  --   --   --   --  ALKPHOS 70  --   --   --   --   BILITOT 0.9  --   --   --   --   GFRNONAA >60  < > >60 >60 >60  GFRAA >60  < > >60 >60 >60  ANIONGAP 7  < > 8 9 8   < > = values in this interval not displayed.   Hematology Recent Labs Lab 10/27/16 0410 10/28/16 0240 10/29/16 0216  WBC 8.0 7.9 6.6  RBC 3.33* 3.26* 3.48*  HGB 10.7* 10.6* 11.3*  HCT 32.0* 31.1* 33.1*  MCV 96.1 95.4 95.1  MCH 32.1 32.5 32.5  MCHC 33.4 34.1 34.1  RDW 13.6 13.5 13.6  PLT 90* 97* 111*    Cardiac Enzymes Recent Labs Lab 10/24/16 2024 10/25/16 0135  TROPONINI <0.03 <0.03    Recent Labs Lab 10/24/16 1308  TROPIPOC 0.00     BNPNo results for input(s): BNP, PROBNP in the last 168 hours.   DDimer No results for input(s): DDIMER in the last 168 hours.   Radiology    No results found.  Cardiac Studies   ECHO 10/27/2016 - Left ventricle: The cavity size was normal. Wall thickness was increased in a pattern of moderate LVH. There was mild focal basal hypertrophy of the septum. Systolic function was normal. The estimated ejection fraction was in the range of 55% to 60%. Wall motion was normal; there were no regional wall motion abnormalities. - Aortic valve: A bioprosthesis was present. Valve area (VTI): 1.62 cm^2.  Valve area (Vmax): 1.34 cm^2. Valve area (Vmean): 1.39 cm^2. - Aortic root: The aortic root was mildly dilated. - Mitral valve: There was moderate regurgitation. - Left atrium: The atrium was severely dilated. - Tricuspid valve: There was severe regurgitation. - Pulmonary arteries: Systolic pressure was moderately increased.   PA peak pressure: 51 mm Hg (S). - Line: A venous catheter was visualized in the right atrium.  Impressions:  - Normal LV systolic function; moderate LVH; s/p AVR with mean   gradient 14 mmHg; mildly dilated aortic root; moderate MR; severe   LAE; severe TR with moderately elevated.  Myoview Study Highlights 04/2016    Nuclear stress EF: 60%.  Blood pressure demonstrated a normal response to exercise.  Horizontal ST segment depression ST segment depression of 2 mm was noted during stress in the II, III, aVF, V6, V5 and V4 leads.  This is a low risk study.  ST segment depression of 2 mm was noted during stress in the II, III, aVF, V6, V5 and V4 leads.  The test was stopped because the patient complained of fatigue and shortness of breath  Overall, the patient's exercise capacity was moderately impaired  [facility] Treadmill Score: intermediate risk  No significant reversible ischemia. LVEF 60% with normal wall motion. 2 mm inferior and lateral scooped ST-segment depression with exercise, may be repolarization abnormality. Exercise capacity was moderately reduced. Overall, however, this is a low risk study.      Patient Profile     81 y.o. male with L hemiparesis and dysphagia following acute embolic CVA and newly diagnosed AFib, previous bioprosthetic AVR for AI.  Assessment & Plan   1. Atrial fibrillation: newly diagnosed, asymptomatic, probable cause of embolic stroke. Well rate controlled. Asymptomatic pauses under 3" are not of concern. This patients CHA2DS2-VASc Score and unadjusted Ischemic Stroke Rate (% per year) is equal to 9.7 % stroke  rate/year from a score of 6 (HTN, DM, stroke, age). Suspect rate control will remain  good even on a lower dose of atenolol and for simplicity will change to 25 mg daily. He does not have "valvular AFib" and is a good candidate for DOAC. He does not have either a mechanical valve or rheumatic mitral valve disease. Brain MRI with small amount of hemorrhage in right putamen; will defer to neurology on timing of anticoagulation 2. Long QT: electrolytes were OK. He had a recent myoview of this year without signs of reversible ischemia. troponins have been negative. Will not pursue ischemic evaluation. Recheck ECG. 3. HTN: BP generally in desirable range. 4. S/P AVR: normal prosthetic valve function by current echo. Also had PFO repair at the time. 5. L hemiparesis and swallowing problems after CVA: PMR evaluation today.  For questions or updates, please contact Story Please consult [email or web address] for contact info under Cardiology/STEMI.      Signed, Sanda Klein, MD  10/29/2016, 12:16 PM

## 2016-10-29 NOTE — H&P (Signed)
Physical Medicine and Rehabilitation Admission H&P    Chief Complaint  Patient presents with  . Code Stroke  : HPI: [name] is a 81 y.o. right handed male with history of borderline diabetes mellitus, CAD, aortic valve replacement 08/03/2010 maintain on aspirin, hypertension. Per chart review, patient, and wife, patient lives with spouse independent prior to admission. 2 level home with bedroom on main level XIV steps to entry. Presented 10/24/2016 with altered mental status aphasia and right gaze deviation. CT head reviewed, showing right basal ganglia infarct. Per report and MRI, acute infarction primarily in the right basal ganglia. Acute infarct also in the insula on the right and scattered tiny areas of acute infarction of the cerebral cortex bilaterally right greater than left. CT angiogram head and neck showed right M1 occlusion extending into both superior and inferior M2 branches. No evidence of craniocervical dissection. Underwent revascularization per interventional radiology. EKG atrial fibrillation on telemetry patient asymptomatic cardiology service is consulted. Noted bouts of bradycardia in the 30s with Tenormin reduced to 37.5 mg. Echocardiogram with ejection fraction of 60% no wall motion abnormalities. Neurology consulted and Eliquis was initiated for CVA prophylaxis in light of atrial fibrillation.Marland Kitchen Dysphagia #3 nectar thick liquid diet. Physical and occupational therapy evaluations completed with recommendations of physical medicine rehabilitation consult Patient was admitted for a comprehensive rehabilitation program  Review of Systems  Constitutional: Negative for chills and fever.  HENT: Negative for hearing loss.   Eyes: Negative for blurred vision and double vision.  Respiratory: Negative for cough and shortness of breath.   Gastrointestinal: Positive for constipation. Negative for nausea and vomiting.  Genitourinary: Positive for urgency.  Musculoskeletal:  Positive for joint pain and myalgias.  Skin: Negative for rash.  Neurological: Positive for speech change and focal weakness.  All other systems reviewed and are negative.  Past Medical History:  Diagnosis Date  . Bladder stones   . Borderline diabetes   . BPH (benign prostatic hyperplasia)   . Coronary artery disease    cardiologist-  dr Nat Math gerhart NP--- per cath 06-02-2010 non-obstructive cad pLAD 30-40%  . Diverticulosis of colon   . DOE (dyspnea on exertion)   . Gout   . History of adenomatous polyp of colon    2002-- tubular adenoma  . History of aortic insufficiency    severe -- s/p  AVR 08-03-2010  . History of basal cell carcinoma (BCC) excision    10/ 2015  left ear  . History of small bowel obstruction    02/ 2007 mechanical sbo s/p  surgical intervention;  partial sbo 09/ 2011 and 03-20-2011 resolved without surgical intervention  . History of squamous cell carcinoma excision    2014 right foot  . History of urinary retention   . HTN (hypertension)   . Peripheral neuropathy   . S/P aortic valve replacement with prosthetic valve 08/03/2010   tissue valve  . S/P patent foramen ovale closure 08/03/2010   at same time AVR   Past Surgical History:  Procedure Laterality Date  . CARDIAC CATHETERIZATION  06-02-2010  dr Marlou Porch   non-obstructive cad- pLAD 30-40%/  normal LVSF/  severe AI  . CARDIOVASCULAR STRESS TEST  04/12/2016   Low risk nuclear perfusion study w/ no significant reversible ischemia/  normal LV function and wall motion ,  stress ef 60%/  51m inferior and lateral scooped ST-segment depression w/ exercise (may be repolarization abnormality), exercise capacity was moderately reduced  . CATARACT EXTRACTION W/ INTRAOCULAR  LENS  IMPLANT, BILATERAL  02/2010  . CYSTOSCOPY WITH LITHOLAPAXY N/A 06/05/2016   Procedure: CYSTOSCOPY WITH LITHOLAPAXY and fulgarization of bladder neck;  Surgeon: Irine Seal, MD;  Location: [facility];  Service:  Urology;  Laterality: N/A;  . EXPLORATORY LAPARTOMY /  CHOLECYSTECTOMY  02/28/2005   for Small  bowel obstruction (mechnical)  . IR ANGIO EXTRACRAN SEL COM CAROTID INNOMINATE UNI L MOD SED  10/24/2016  . IR ANGIO VERTEBRAL SEL SUBCLAVIAN INNOMINATE BILAT MOD SED  10/24/2016  . IR PERCUTANEOUS ART THROMBECTOMY/INFUSION INTRACRANIAL INC DIAG ANGIO  10/24/2016  . LEFT KNEE ARTHROSCOPY  2006  . RADIOLOGY WITH ANESTHESIA N/A 10/24/2016   Procedure: RADIOLOGY WITH ANESTHESIA;  Surgeon: Luanne Bras, MD;  Location: [place];  Service: Radiology;  Laterality: N/A;  . RIGHT FOOT SURGERY    . RIGHT MINIATURE ANTERIOR THORACOTOMY FOR AORTIC VALVE REPLACEMENT AND CLOSURE PATENT FORAMEN OVALE  08-03-2010  DR Levada Schilling Magna-ease pericardial tissue valve (61m)  . TRANSTHORACIC ECHOCARDIOGRAM  05/30/2016  dr skains   moderate  LVH ef 60-65%/  bioprothesis aortic valve present ,normal grandient and no AI /  mild MV calcification , moderate MR /  mild PR/ moderate TR/  PASP 327mg/ (RA denisty was identified 04-27-2016 echo) and is seen again today, this is likely a promient eustacian ridge, atrium is normal size   Family History  Problem Relation Age of Onset  . Heart disease Mother   . Brain cancer Father    Social History:  reports that he has never smoked. He has never used smokeless tobacco. He reports that he drinks alcohol. He reports that he does not use drugs. Allergies:  Allergies  Allergen Reactions  . Sulfa Antibiotics Other (See Comments)    Granulocytosis   Medications Prior to Admission  Medication Sig Dispense Refill  . allopurinol (ZYLOPRIM) 300 MG tablet Take 300 mg by mouth daily.      . Marland KitchenmLODipine (NORVASC) 5 MG tablet Take 5 mg by mouth daily.     . Marland Kitchenspirin 81 MG tablet Take 81 mg by mouth daily.     . Marland Kitchentenolol (TENORMIN) 50 MG tablet Take 50 mg by mouth daily.      . Marland Kitchen complex vitamins tablet Take 1 tablet by mouth daily.    . finasteride (PROSCAR) 5 MG tablet Take 5  mg by mouth daily.      . tamsulosin (FLOMAX) 0.4 MG CAPS capsule Take 0.8 mg by mouth daily after breakfast.     . amoxicillin (AMOXIL) 500 MG capsule Take 2,000 mg by mouth as needed (1 hour prior before dental procedures).       Drug Regimen Review  Drug regimen was reviewed and remains appropriate with no significant issues identified  Home: Home Living Family/patient expects to be discharged to:: Private residence Living Arrangements: Spouse/significant other Available Help at Discharge: Family, Available 24 hours/day Type of Home: House Home Access: Stairs to enter [employer] Steps: 4 Entrance Stairs-Rails: None Home Layout: Two level, Able to live on main level with bedroom/bathroom Bathroom Shower/Tub: WaMultimedia programmerStandard Home Equipment: CaRadio producer single point  Lives With: Spouse, Son   Functional History: Prior Function Level of Independence: Independent  Functional Status:  Mobility: Bed Mobility Overal bed mobility: Needs Assistance Bed Mobility: Supine to Sit, Sit to Supine Supine to sit: Supervision Sit to supine: Supervision General bed mobility comments: no physical assist required, increased time to perform Transfers Overall transfer level: Needs assistance  Equipment used: Rolling walker (2 wheeled), None Transfers: Sit to/from Stand Sit to Stand: Min guard, Supervision General transfer comment: Min guard from bed x2, supervision from toilet with use of grab bar. Use of RW x2 from bed Ambulation/Gait Ambulation/Gait assistance: Min guard, Min assist Ambulation Distance (Feet): 110 Feet (x3) Assistive device: Rolling walker (2 wheeled) Gait Pattern/deviations: Step-through pattern, Decreased stride length, Narrow base of support, Trunk flexed General Gait Details: patient cues for upright posture and positioning durain ambulation, relied upon rw for higher level bealance tasks such as head turns, side steps,  Gait  velocity: decreased Gait velocity interpretation: Below normal speed for age/gender    ADL: ADL Overall ADL's : Needs assistance/impaired Eating/Feeding: Minimal assistance, Sitting Grooming: Sitting, Moderate assistance Upper Body Bathing: Moderate assistance, Sitting Lower Body Bathing: Moderate assistance, Sit to/from stand Upper Body Dressing : Moderate assistance, Sitting Lower Body Dressing: Moderate assistance, Sit to/from stand Lower Body Dressing Details (indicate cue type and reason): min for socks Functional mobility during ADLs: +2 for physical assistance, Minimal assistance, Moderate assistance (increased need for assist with fatigue)  Cognition: Cognition Overall Cognitive Status: Impaired/Different from baseline Arousal/Alertness: Awake/alert Orientation Level: Oriented X4 Attention: Sustained Sustained Attention: Appears intact Memory: Appears intact (for 3 and 5 word retention) Awareness: Appears intact Safety/Judgment: Other (comment) Cognition Arousal/Alertness: Awake/alert Behavior During Therapy: Flat affect Overall Cognitive Status: Impaired/Different from baseline Area of Impairment: Safety/judgement, Problem solving Safety/Judgement: Decreased awareness of deficits Problem Solving: Slow processing General Comments: some improvements noted in cognition, patient rang out for assist to go to the bathroom today. Patient also recognized need to performed hygiene and pericare with wash cloth stating, I really feel like i just want to wash my bottom a bit after using the bathroom"  Physical Exam: Blood pressure (!) 142/91, pulse 74, temperature 98.3 F (36.8 C), temperature source Oral, resp. rate 18, height 6' (1.829 m), weight 83 kg (182 lb 14.4 oz), SpO2 98 %. Physical Exam  Constitutional: He is oriented to person, place, and time. He appears well-developed.  Frail  HENT:  Head: Normocephalic and atraumatic.  Eyes: EOM are normal. Right eye exhibits no  discharge. Left eye exhibits no discharge.  Neck: Normal range of motion. Neck supple. No thyromegaly present.  Cardiovascular:  Irregularly irregular  Respiratory: Effort normal and breath sounds normal. No respiratory distress.  GI: Soft. Bowel sounds are normal. He exhibits no distension.  Musculoskeletal: He exhibits no edema or tenderness.  Neurological: He is alert and oriented to person, place, and time.  Speech is dysarthric but intelligible  Left central 7.  Awareness of deficits.  Motor: RUE/RLE 5/5.  LUE 4/5 prox to distal.  LLE: 4/5 HF, KE and ADF/PF.  Sensation intact to light touch  Skin: Skin is warm and dry.  Psychiatric: He has a normal mood and affect. His behavior is normal. Thought content normal.   Results for orders placed or performed during the hospital encounter of 10/24/16 (from the past 48 hour(s))  Glucose, capillary     Status: Abnormal   Collection Time: 10/27/16  5:44 PM  Result Value Ref Range   Glucose-Capillary 128 (H) 65 - 99 mg/dL  Glucose, capillary     Status: Abnormal   Collection Time: 10/27/16  9:40 PM  Result Value Ref Range   Glucose-Capillary 101 (H) 65 - 99 mg/dL   Comment 1 Notify RN    Comment 2 Document in Chart   CBC     Status: Abnormal   Collection Time:  10/28/16  2:40 AM  Result Value Ref Range   WBC 7.9 4.0 - 10.5 K/uL   RBC 3.26 (L) 4.22 - 5.81 MIL/uL   Hemoglobin 10.6 (L) 13.0 - 17.0 g/dL   HCT 31.1 (L) 39.0 - 52.0 %   MCV 95.4 78.0 - 100.0 fL   MCH 32.5 26.0 - 34.0 pg   MCHC 34.1 30.0 - 36.0 g/dL   RDW 13.5 11.5 - 15.5 %   Platelets 97 (L) 150 - 400 K/uL    Comment: CONSISTENT WITH PREVIOUS RESULT  Basic metabolic panel     Status: Abnormal   Collection Time: 10/28/16  2:40 AM  Result Value Ref Range   Sodium 138 135 - 145 mmol/L   Potassium 3.4 (L) 3.5 - 5.1 mmol/L   Chloride 110 101 - 111 mmol/L   CO2 19 (L) 22 - 32 mmol/L   Glucose, Bld 93 65 - 99 mg/dL   BUN 9 6 - 20 mg/dL   Creatinine, Ser 0.67 0.61 - 1.24  mg/dL   Calcium 8.3 (L) 8.9 - 10.3 mg/dL   GFR calc non Af Amer >60 >60 mL/min   GFR calc Af Amer >60 >60 mL/min    Comment: (NOTE) The eGFR has been calculated using the CKD EPI equation. This calculation has not been validated in all clinical situations. eGFR's persistently <60 mL/min signify possible Chronic Kidney Disease.    Anion gap 9 5 - 15  Glucose, capillary     Status: None   Collection Time: 10/28/16  6:23 AM  Result Value Ref Range   Glucose-Capillary 91 65 - 99 mg/dL   Comment 1 Notify RN    Comment 2 Document in Chart   Glucose, capillary     Status: Abnormal   Collection Time: 10/28/16 11:36 AM  Result Value Ref Range   Glucose-Capillary 116 (H) 65 - 99 mg/dL  Glucose, capillary     Status: Abnormal   Collection Time: 10/28/16  4:46 PM  Result Value Ref Range   Glucose-Capillary 109 (H) 65 - 99 mg/dL  Glucose, capillary     Status: Abnormal   Collection Time: 10/28/16  9:04 PM  Result Value Ref Range   Glucose-Capillary 115 (H) 65 - 99 mg/dL   Comment 1 Notify RN    Comment 2 Document in Chart   CBC     Status: Abnormal   Collection Time: 10/29/16  2:16 AM  Result Value Ref Range   WBC 6.6 4.0 - 10.5 K/uL   RBC 3.48 (L) 4.22 - 5.81 MIL/uL   Hemoglobin 11.3 (L) 13.0 - 17.0 g/dL   HCT 33.1 (L) 39.0 - 52.0 %   MCV 95.1 78.0 - 100.0 fL   MCH 32.5 26.0 - 34.0 pg   MCHC 34.1 30.0 - 36.0 g/dL   RDW 13.6 11.5 - 15.5 %   Platelets 111 (L) 150 - 400 K/uL    Comment: CONSISTENT WITH PREVIOUS RESULT  Basic metabolic panel     Status: Abnormal   Collection Time: 10/29/16  2:16 AM  Result Value Ref Range   Sodium 138 135 - 145 mmol/L   Potassium 3.9 3.5 - 5.1 mmol/L   Chloride 110 101 - 111 mmol/L   CO2 20 (L) 22 - 32 mmol/L   Glucose, Bld 103 (H) 65 - 99 mg/dL   BUN 8 6 - 20 mg/dL   Creatinine, Ser 0.68 0.61 - 1.24 mg/dL   Calcium 8.6 (L) 8.9 - 10.3 mg/dL   GFR  calc non Af Amer >60 >60 mL/min   GFR calc Af Amer >60 >60 mL/min    Comment: (NOTE) The eGFR  has been calculated using the CKD EPI equation. This calculation has not been validated in all clinical situations. eGFR's persistently <60 mL/min signify possible Chronic Kidney Disease.    Anion gap 8 5 - 15  Glucose, capillary     Status: Abnormal   Collection Time: 10/29/16  6:17 AM  Result Value Ref Range   Glucose-Capillary 115 (H) 65 - 99 mg/dL   Comment 1 Notify RN    Comment 2 Document in Chart   Glucose, capillary     Status: Abnormal   Collection Time: 10/29/16 11:07 AM  Result Value Ref Range   Glucose-Capillary 132 (H) 65 - 99 mg/dL   No results found.     Medical Problem List and Plan: 1.  Left hemiparesis and dysphagia secondary to right MCA infarction 2.  DVT Prophylaxis/Anticoagulation: Eliquis 3. Pain Management: Baclofen 5 mg 3 times a day 4. Mood: Provide emotional support 5. Neuropsych: This patient is capable of making decisions on his own behalf. 6. Skin/Wound Care: Routine skin checks 7. Fluids/Electrolytes/Nutrition: Routine I&O with follow-up chemistries 8. Atrial fibrillation/status post aortic valve replacement. Cardiology service follow-up. Atenolol reduced to 37.5 mg due to bouts of bradycardia. 9. Dysphagia. Dysphagia #3 her liquids. Follow-up speech therapy 10. Hypertension. Norvasc 10 mg daily 11. Hypokalemia. Follow-up chemistries 12. Borderline diabetes mellitus. Hemoglobin A1c 5.6. Patient diet controlled prior to admission. Continue sliding scale for now 13. BPH. Proscar 5 mg daily, Flomax 0.8 mg daily. Check PVR 3   Post Admission Physician Evaluation: 1. Preadmission assessment reviewed and changes made below. 2. Functional deficits secondary  to right MCA infarction. 3. Patient is admitted to receive collaborative, interdisciplinary care between the physiatrist, rehab nursing staff, and therapy team. 4. Patient's level of medical complexity and substantial therapy needs in context of that medical necessity cannot be provided at a  lesser intensity of care such as a SNF. 5. Patient has experienced substantial functional loss from his/her baseline which was documented above under the "Functional History" and "Functional Status" headings.  Judging by the patient's diagnosis, physical exam, and functional history, the patient has potential for functional progress which will result in measurable gains while on inpatient rehab.  These gains will be of substantial and practical use upon discharge  in facilitating mobility and self-care at the household level. 68. Physiatrist will provide 24 hour management of medical needs as well as oversight of the therapy plan/treatment and provide guidance as appropriate regarding the interaction of the two. 7. 24 hour rehab nursing will assist with safety, disease management and patient education  and help integrate therapy concepts, techniques,education, etc. 8. PT will assess and treat for/with: Lower extremity strength, range of motion, stamina, balance, functional mobility, safety, adaptive techniques and equipment, coping skills, pain control, stroke education.   Goals are: Mod I/Supervision. 9. OT will assess and treat for/with: ADL's, functional mobility, safety, upper extremity strength, adaptive techniques and equipment, ego support, and community reintegration.   Goals are: Mod I/Supervision. Therapy may proceed with showering this patient. 10. SLP will assess and treat for/with: swallowing.  Goals are: Mod I. 11. Case Management and Social Worker will assess and treat for psychological issues and discharge planning. 12. Team conference will be held weekly to assess progress toward goals and to determine barriers to discharge. 13. Patient will receive at least 3 hours of therapy per day  at least 5 days per week. 14. ELOS: 5-9 days. 15. Prognosis:  good     Delice Lesch, MD, ABPMR Lauraine Rinne J., PA-C 10/29/2016

## 2016-10-29 NOTE — Progress Notes (Signed)
Gunnar Fusi Rehab Admission Coordinator Signed Physical Medicine and Rehabilitation  PMR Pre-admission Date of Service: 10/29/2016 1:49 PM  Related encounter: ED to Hosp-Admission (Current) from 10/24/2016 in [facility]       [] Hide copied text PMR Admission Coordinator Pre-Admission Assessment  Patient: [name] is an 81 y.o., male MRN: [id number] DOB: [birth year] Height: 6' (182.9 cm) Weight: 83 kg (182 lb 14.4 oz)                                                                                                                                                  Insurance Information HMO:     PPO: X     PCP:      IPA:      80/20:      OTHER: [employer] Med. Complete Choice  PRIMARY: [facility] Medicare      Policy#: [id number]      Subscriber: Self CM Name: Vevelyn Royals       Phone#: [phone]     Fax#: [phone] Pre-Cert#: [id number]      Employer: Retired  Benefits:  Phone #: [phone]     Name: Verified online, UHC Portal  Eff. Date: 01/09/16     Deduct: $0      Out of Pocket Max: $4500      Life Max: N/A CIR: $345 a day, days 1-5; $0 a day, days 6+      SNF: $0 a day, days 1-20; $160 a day, days 21-49; $0 a day, days 50-100 Outpatient: Necessity      Co-Pay: $40 per visit  Home Health: Necessity, 100%      Co-Pay: None DME: 80%     Co-Pay: 20% Providers: In-network  SECONDARY: None      Policy#:       Subscriber:  CM Name:       Phone#:      Fax#:  Pre-Cert#:       Employer:  Benefits:  Phone #:      Name:  Eff. Date:      Deduct:       Out of Pocket Max:       Life Max:  CIR:       SNF:  Outpatient:      Co-Pay:  Home Health:       Co-Pay:  DME:      Co-Pay:   Medicaid Application Date:       Case Manager:  Disability Application Date:       Case Worker:   Emergency Contact Information        Contact Information    Name Relation Home Work [place] Spouse [phone]  [phone]     Current Medical History  Patient  Admitting Diagnosis: right basal ganlia/insular infarcts with left hemiparesis  History of Present Illness: Gerald Saxon  J Peabodyis a 81 y.o.right handed malewith history ofborderline diabetes mellitus,CAD, aortic valve replacement07/26/2012maintain on aspirin, hypertension. Per chart review, patient and wife,patient lives with spouse independent prior to admission. 2 level home with bedroom on main level 4 steps to entry. Presented 10/24/2016 with altered mental status aphasia and right gaze deviation. CT head reviewed, showing right basal ganglia infarct. Per report and MRI, acute infarction primarily in the right basal ganglia. Acute infarct also in the insula on the right and scattered tiny areas of acute infarction of the cerebral cortex bilaterally right greater than left. CT angiogram head and neck showed right M1 occlusion extending into both superior and inferior M2 branches. No evidence of craniocervical dissection. Underwent revascularization per interventional radiology.EKG atrial fibrillation on telemetry patient asymptomatic cardiology service is consulted. Noted bouts of bradycardia in the 30s with Tenormin reduced to 37.5 mg.Echocardiogram with ejection fraction of 60% no wall motion abnormalities. Neurology consulted and Eliquiswas initiated for CVA prophylaxis in light of atrial fibrillation.Dysphagia 3 nectar-thick liquid diet. Physicaland occupationaltherapy evaluationscompleted with recommendations of physical medicine rehabilitation consult and patient was admitted for a comprehensive rehabilitation program 10/29/16.  NIH Total: 3  Past Medical History      Past Medical History:  Diagnosis Date  . Bladder stones   . Borderline diabetes   . BPH (benign prostatic hyperplasia)   . Coronary artery disease    cardiologist-  dr Nat Math gerhart NP--- per cath 06-02-2010 non-obstructive cad pLAD 30-40%  . Diverticulosis of colon   . DOE (dyspnea on exertion)     . Gout   . History of adenomatous polyp of colon    2002-- tubular adenoma  . History of aortic insufficiency    severe -- s/p  AVR 08-03-2010  . History of basal cell carcinoma (BCC) excision    10/ 2015  left ear  . History of small bowel obstruction    02/ 2007 mechanical sbo s/p  surgical intervention;  partial sbo 09/ 2011 and 03-20-2011 resolved without surgical intervention  . History of squamous cell carcinoma excision    2014 right foot  . History of urinary retention   . HTN (hypertension)   . Peripheral neuropathy   . S/P aortic valve replacement with prosthetic valve 08/03/2010   tissue valve  . S/P patent foramen ovale closure 08/03/2010   at same time AVR    Family History  family history includes Brain cancer in his father; Heart disease in his mother.  Prior Rehab/Hospitalizations:  Has the patient had major surgery during 100 days prior to admission? Yes  Current Medications   Current Facility-Administered Medications:  .  acetaminophen (TYLENOL) tablet 650 mg, 650 mg, Oral, Q4H PRN **OR** [DISCONTINUED] acetaminophen (TYLENOL) solution 650 mg, 650 mg, Per Tube, Q4H PRN, 650 mg at 10/25/16 1622 **OR** [DISCONTINUED] acetaminophen (TYLENOL) suppository 650 mg, 650 mg, Rectal, Q4H PRN, Marliss Coots, PA-C .  amLODipine (NORVASC) tablet 10 mg, 10 mg, Oral, Daily, Rosalin Hawking, MD, 10 mg at 10/29/16 1034 .  apixaban (ELIQUIS) tablet 5 mg, 5 mg, Oral, BID, Rosalin Hawking, MD .  Derrill Memo ON 10/30/2016] atenolol (TENORMIN) tablet 25 mg, 25 mg, Oral, Daily, Croitoru, Mihai, MD .  baclofen (LIORESAL) tablet 10 mg, 10 mg, Oral, TID, Rosalin Hawking, MD .  bisacodyl (DULCOLAX) suppository 10 mg, 10 mg, Rectal, Daily PRN, Jennelle Human B, NP .  finasteride (PROSCAR) tablet 5 mg, 5 mg, Oral, Daily, Rice, Resa Miner, MD, 5 mg at 10/29/16 1033 .  insulin  aspart (novoLOG) injection 0-9 Units, 0-9 Units, Subcutaneous, TID AC & HS, Corey Harold, NP, 1 Units at  10/27/16 1808 .  labetalol (NORMODYNE,TRANDATE) injection 5-20 mg, 5-20 mg, Intravenous, Q2H PRN, Rosalin Hawking, MD .  ondansetron [facility]) injection 4 mg, 4 mg, Intravenous, Q6H PRN, Luanne Bras, MD .  RESOURCE THICKENUP CLEAR, , Oral, Once, Corey Harold, NP .  senna-docusate (Senokot-S) tablet 1 tablet, 1 tablet, Oral, QHS PRN, Marliss Coots, PA-C .  tamsulosin [facility]) capsule 0.8 mg, 0.8 mg, Oral, QPC breakfast, Rice, Resa Miner, MD, 0.8 mg at 10/29/16 1032  Patients Current Diet: DIET DYS 3 Room service appropriate? Yes; Fluid consistency: Nectar Thick  Precautions / Restrictions Precautions Precautions: Fall Restrictions Weight Bearing Restrictions: No   Has the patient had 2 or more falls or a fall with injury in the past year?No  Prior Activity Level Community (5-7x/wk): Prior to admission patient was fully independent without an assistive device and active: driving, playing bridge, managing investments, and doing the yard work.    Home Assistive Devices / Equipment Home Assistive Devices/Equipment: Blood pressure cuff, CBG Meter, Eyeglasses Home Equipment: Cane - single point  Prior Device Use: Indicate devices/aids used by the patient prior to current illness, exacerbation or injury? None of the above  Prior Functional Level Prior Function Level of Independence: Independent  Self Care: Did the patient need help bathing, dressing, using the toilet or eating? Independent  Indoor Mobility: Did the patient need assistance with walking from room to room (with or without device)? Independent  Stairs: Did the patient need assistance with internal or external stairs (with or without device)? Independent  Functional Cognition: Did the patient need help planning regular tasks such as shopping or remembering to take medications? Independent  Current Functional Level Cognition  Arousal/Alertness: Awake/alert Overall Cognitive Status: Impaired/Different  from baseline Orientation Level: Oriented X4 Safety/Judgement: Decreased awareness of deficits General Comments: some improvements noted in cognition, patient rang out for assist to go to the bathroom today. Patient also recognized need to performed hygiene and pericare with wash cloth stating, I really feel like i just want to wash my bottom a bit after using the bathroom" Attention: Sustained Sustained Attention: Appears intact Memory: Appears intact (for 3 and 5 word retention) Awareness: Appears intact Safety/Judgment: Other (comment)    Extremity Assessment (includes Sensation/Coordination)  Upper Extremity Assessment: LUE deficits/detail, RUE deficits/detail RUE Deficits / Details: decreased grip strength LUE Deficits / Details: full AROM, strength 4-/5, +drift, tremor LUE Sensation: decreased proprioception LUE Coordination: decreased fine motor, decreased gross motor  Lower Extremity Assessment: Defer to PT evaluation RLE Deficits / Details: 4/5 hip flexion, knee extension LLE Deficits / Details: 3/5 hip flexion, knee extension    ADLs  Overall ADL's : Needs assistance/impaired Eating/Feeding: Minimal assistance, Sitting Grooming: Sitting, Moderate assistance Upper Body Bathing: Moderate assistance, Sitting Lower Body Bathing: Moderate assistance, Sit to/from stand Upper Body Dressing : Moderate assistance, Sitting Lower Body Dressing: Moderate assistance, Sit to/from stand Lower Body Dressing Details (indicate cue type and reason): min for socks Functional mobility during ADLs: +2 for physical assistance, Minimal assistance, Moderate assistance (increased need for assist with fatigue)    Mobility  Overal bed mobility: Needs Assistance Bed Mobility: Supine to Sit, Sit to Supine Supine to sit: Supervision Sit to supine: Supervision General bed mobility comments: no physical assist required, increased time to perform    Transfers  Overall transfer level: Needs  assistance Equipment used: Rolling walker (2 wheeled), None Transfers: Sit  to/from Stand Sit to Stand: Min guard, Supervision General transfer comment: Min guard from bed x2, supervision from toilet with use of grab bar. Use of RW x2 from bed    Ambulation / Gait / Stairs / Wheelchair Mobility  Ambulation/Gait Ambulation/Gait assistance: Min guard, Min assist Ambulation Distance (Feet): 110 Feet (x3) Assistive device: Rolling walker (2 wheeled) Gait Pattern/deviations: Step-through pattern, Decreased stride length, Narrow base of support, Trunk flexed General Gait Details: patient cues for upright posture and positioning durain ambulation, relied upon rw for higher level bealance tasks such as head turns, side steps,  Gait velocity: decreased Gait velocity interpretation: Below normal speed for age/gender    Posture / Balance Dynamic Sitting Balance Sitting balance - Comments: pt required LUE assist to maintain balance EOB Static Standing Balance Single Leg Stance - Right Leg: 2 Single Leg Stance - Left Leg: 1 Rhomberg - Eyes Opened: 40 Balance Overall balance assessment: Needs assistance Sitting balance-Leahy Scale: Fair Sitting balance - Comments: pt required LUE assist to maintain balance EOB Postural control: Posterior lean Standing balance-Leahy Scale: Fair Single Leg Stance - Right Leg: 2 Single Leg Stance - Left Leg: 1 Rhomberg - Eyes Opened: 40 High Level Balance Comments: pt fatigued and unable to challenge balance further today    Special needs/care consideration BiPAP/CPAP: No CPM: No Continuous Drip IV: No Dialysis: No        Life Vest: No Oxygen: No Special Bed: No Trach Size: No Wound Vac (area): No       Skin: Bruising to bilateral upper extremities with left greater than right                               Bowel mgmt: Continent, last BM 10/29/16 more solid compared to previous loose stools  Bladder mgmt: Continent, but more frequency per patient report.  History of enlarged prostate and he is followed by MD at [facility] Diabetic mgmt: No, [id number]     Previous Home Environment Living Arrangements: Spouse/significant other  Lives With: Spouse, Son Available Help at Discharge: Family, Available 24 hours/day Type of Home: House Home Layout: Two level, Able to live on main level with bedroom/bathroom Home Access: Stairs to enter Entrance Stairs-Rails: None Entrance Stairs-Number of Steps: 4 Bathroom Shower/Tub: Multimedia programmer: [place]: No  Discharge Living Setting Plans for Discharge Living Setting: Patient's home, Lives with (comment) (Spouse ) Type of Home at Discharge: House Discharge Home Layout: Two level, Able to live on main level with bedroom/bathroom Alternate Level Stairs-Rails: Left Alternate Level Stairs-Number of Steps: 14-15 Discharge Home Access: Stairs to enter Entrance Stairs-Rails: None Entrance Stairs-Number of Steps: 4 Discharge Bathroom Shower/Tub: Horticulturist, commercial: Standard Discharge Bathroom Accessibility:  (unsure, likely no) Does the patient have any problems obtaining your medications?: No  Social/Family/Support Systems Patient Roles: Spouse Contact Information: SpouseAngelita Ingles Forni cell: [phone] Anticipated Caregiver: Spouse  Anticipated Caregiver's Contact Information: see above Ability/Limitations of Caregiver: None Caregiver Availability: 24/7 Discharge Plan Discussed with Primary Caregiver: Yes Is Caregiver In Agreement with Plan?: Yes Does Caregiver/Family have Issues with Lodging/Transportation while Pt is in Rehab?: No  Goals/Additional Needs Patient/Family Goal for Rehab: PT/OT/SLP: Mod I Expected length of stay: 5-7 days  Cultural Considerations: None Dietary Needs: Dys.3 textures and nectar-thick liquids  Equipment Needs: TBD Special Service Needs: None Additional Information: None Pt/Family Agrees to  Admission and willing to participate: Yes Program Orientation Provided &  Reviewed with Pt/Caregiver Including Roles  & Responsibilities: Yes Additional Information Needs: Patient drove PTA Information Needs to be Provided By: None  Decrease burden of Care through IP rehab admission: No  Possible need for SNF placement upon discharge: No  Patient Condition: This patient's medical and functional status has changed since the consult dated: 10/26/16 in which the Rehabilitation Physician determined and documented that the patient's condition is appropriate for intensive rehabilitative care in an inpatient rehabilitation facility. See "History of Present Illness" (above) for medical update. Functional changes are: Min A; Min guard 100 feet with PT, Dys.3 textures and nectar-thick liquids with SLP, and requires left upper extremity assist without assistive device to prevent loss of balance with OT. Patient's medical and functional status update has been discussed with the Rehabilitation physician and patient remains appropriate for inpatient rehabilitation. Will admit to inpatient rehab today.  Preadmission Screen Completed By:  Gunnar Fusi, 10/29/2016 1:49 PM ______________________________________________________________________   Discussed status with Dr. Posey Pronto on 10/29/16 at 21 and received telephone approval for admission today.  Admission Coordinator:  Gunnar Fusi, time 1150/Date 10/29/16       Cosigned by: Jamse Arn, MD at 10/29/2016 2:13 PM  Revision History
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
## 2016-10-29 NOTE — Progress Notes (Signed)
  Speech Language Pathology Treatment: Dysphagia  Patient Details Name: [name] MRN: [id number] DOB: 29-Sep-1935 [name] Date: 10/29/2016 Time: 8341-9622 SLP Time Calculation (min) (ACUTE ONLY): 11 min  Assessment / Plan / Recommendation Clinical Impression  Pt demonstrated no signs of aspiration with nectar thick liquids, but persistent cough/slight throat clear with thi liquids. Trialed a chin tuck with reduced coughing, but still occasional thraat clear observed. Recommend MBS for objective assessment of swallowing to determine potential for use of compensatory strategies to mitigate risks. Pt and family in agreement with plan, will f/u tomorrow in am, wife planning to attend.   HPI HPI: 81 year old male with hx HTN, DM, AI s/p AVR w/prothestic valve (7/26/201) and PFO repair, BPH suffered CVA 10/17. Was taken to IR for revascularization and was sent to ICU on vent post-operatively.  ETT -10/17-18. MRI - acute infarct right basal ganglia, insula, and scattered tiny areas acute infarct cerebral cortex bilaterally right>left; small hemorrhage right putamen.         SLP Plan  Continue with current plan of care       Recommendations  Diet recommendations: Dysphagia 3 (mechanical soft);Nectar-thick liquid Liquids provided via: Cup Medication Administration: Whole meds with puree Supervision: Patient able to self feed Compensations: Slow rate;Small sips/bites Postural Changes and/or Swallow Maneuvers: Seated upright 90 degrees                Plan: Continue with current plan of care       GO               [facility], [place] CCC-SLP [phone]  Lynann Beaver 10/29/2016, 11:03 AM

## 2016-10-29 NOTE — PMR Pre-admission (Signed)
PMR Admission Coordinator Pre-Admission Assessment  Patient: [name] is an 81 y.o., male MRN: [id number] DOB: December 26, 1935 Height: 6' (182.9 cm) Weight: 83 kg (182 lb 14.4 oz)              Insurance Information HMO:     PPO: X     PCP:      IPA:      80/20:      OTHER: [employer] Med. Complete Choice  PRIMARY: [facility] Medicare      Policy#: [id number]      Subscriber: Self CM Name: Vevelyn Royals       Phone#: [phone]     Fax#: [phone] Pre-Cert#: [id number]      Employer: Retired  Benefits:  Phone #: [phone]     Name: Verified online, UHC Portal  Eff. Date: 01/09/16     Deduct: $0      Out of Pocket Max: $4500      Life Max: N/A CIR: $345 a day, days 1-5; $0 a day, days 6+      SNF: $0 a day, days 1-20; $160 a day, days 21-49; $0 a day, days 50-100 Outpatient: Necessity      Co-Pay: $40 per visit  Home Health: Necessity, 100%      Co-Pay: None DME: 80%     Co-Pay: 20% Providers: In-network  SECONDARY: None      Policy#:       Subscriber:  CM Name:       Phone#:      Fax#:  Pre-Cert#:       Employer:  Benefits:  Phone #:      Name:  Eff. Date:      Deduct:       Out of Pocket Max:       Life Max:  CIR:       SNF:  Outpatient:      Co-Pay:  Home Health:       Co-Pay:  DME:      Co-Pay:   Medicaid Application Date:       Case Manager:  Disability Application Date:       Case Worker:   Emergency Contact Information Contact Information    Name Relation Home Work Tom Green Spouse [phone]  [phone]     Current Medical History  Patient Admitting Diagnosis: right basal ganlia/insular infarcts with left hemiparesis  History of Present Illness: Gerald Mohamed Peabodyis a 81 y.o.right handed malewith history of borderline diabetes mellitus, CAD, aortic valve replacement 08/03/2010 maintain on aspirin, hypertension. Per chart review, patient and wife, patient lives with spouse independent prior to admission. 2 level home with bedroom on main level 4 steps to  entry. Presented 10/24/2016 with altered mental status aphasia and right gaze deviation. CT head reviewed, showing right basal ganglia infarct. Per report and MRI, acute infarction primarily in the right basal ganglia. Acute infarct also in the insula on the right and scattered tiny areas of acute infarction of the cerebral cortex bilaterally right greater than left. CT angiogram head and neck showed right M1 occlusion extending into both superior and inferior M2 branches. No evidence of craniocervical dissection. Underwent revascularization per interventional radiology. EKG atrial fibrillation on telemetry patient asymptomatic cardiology service is consulted. Noted bouts of bradycardia in the 30s with Tenormin reduced to 37.5 mg. Echocardiogram with ejection fraction of 60% no wall motion abnormalities. Neurology consulted and Eliquis was initiated for CVA prophylaxis in light of atrial fibrillation. Dysphagia 3 nectar-thick liquid  diet. Physical and occupational therapy evaluations completed with recommendations of physical medicine rehabilitation consult and patient was admitted for a comprehensive rehabilitation program 10/29/16.  NIH Total: 3    Past Medical History  Past Medical History:  Diagnosis Date  . Bladder stones   . Borderline diabetes   . BPH (benign prostatic hyperplasia)   . Coronary artery disease    cardiologist-  dr Nat Math gerhart NP--- per cath 06-02-2010 non-obstructive cad pLAD 30-40%  . Diverticulosis of colon   . DOE (dyspnea on exertion)   . Gout   . History of adenomatous polyp of colon    2002-- tubular adenoma  . History of aortic insufficiency    severe -- s/p  AVR 08-03-2010  . History of basal cell carcinoma (BCC) excision    10/ 2015  left ear  . History of small bowel obstruction    02/ 2007 mechanical sbo s/p  surgical intervention;  partial sbo 09/ 2011 and 03-20-2011 resolved without surgical intervention  . History of squamous cell carcinoma  excision    2014 right foot  . History of urinary retention   . HTN (hypertension)   . Peripheral neuropathy   . S/P aortic valve replacement with prosthetic valve 08/03/2010   tissue valve  . S/P patent foramen ovale closure 08/03/2010   at same time AVR    Family History  family history includes Brain cancer in his father; Heart disease in his mother.  Prior Rehab/Hospitalizations:  Has the patient had major surgery during 100 days prior to admission? Yes  Current Medications   Current Facility-Administered Medications:  .  acetaminophen (TYLENOL) tablet 650 mg, 650 mg, Oral, Q4H PRN **OR** [DISCONTINUED] acetaminophen (TYLENOL) solution 650 mg, 650 mg, Per Tube, Q4H PRN, 650 mg at 10/25/16 1622 **OR** [DISCONTINUED] acetaminophen (TYLENOL) suppository 650 mg, 650 mg, Rectal, Q4H PRN, Marliss Coots, PA-C .  amLODipine (NORVASC) tablet 10 mg, 10 mg, Oral, Daily, Rosalin Hawking, MD, 10 mg at 10/29/16 1034 .  apixaban (ELIQUIS) tablet 5 mg, 5 mg, Oral, BID, Rosalin Hawking, MD .  Derrill Memo ON 10/30/2016] atenolol (TENORMIN) tablet 25 mg, 25 mg, Oral, Daily, Croitoru, Mihai, MD .  baclofen (LIORESAL) tablet 10 mg, 10 mg, Oral, TID, Rosalin Hawking, MD .  bisacodyl (DULCOLAX) suppository 10 mg, 10 mg, Rectal, Daily PRN, Jennelle Human B, NP .  finasteride (PROSCAR) tablet 5 mg, 5 mg, Oral, Daily, Rice, Resa Miner, MD, 5 mg at 10/29/16 1033 .  insulin aspart (novoLOG) injection 0-9 Units, 0-9 Units, Subcutaneous, TID AC & HS, Corey Harold, NP, 1 Units at 10/27/16 1808 .  labetalol (NORMODYNE,TRANDATE) injection 5-20 mg, 5-20 mg, Intravenous, Q2H PRN, Rosalin Hawking, MD .  ondansetron [facility]) injection 4 mg, 4 mg, Intravenous, Q6H PRN, Luanne Bras, MD .  RESOURCE THICKENUP CLEAR, , Oral, Once, Corey Harold, NP .  senna-docusate (Senokot-S) tablet 1 tablet, 1 tablet, Oral, QHS PRN, Marliss Coots, PA-C .  tamsulosin [facility]) capsule 0.8 mg, 0.8 mg, Oral, QPC breakfast, Rice, Resa Miner,  MD, 0.8 mg at 10/29/16 1032  Patients Current Diet: DIET DYS 3 Room service appropriate? Yes; Fluid consistency: Nectar Thick  Precautions / Restrictions Precautions Precautions: Fall Restrictions Weight Bearing Restrictions: No   Has the patient had 2 or more falls or a fall with injury in the past year?No  Prior Activity Level Community (5-7x/wk): Prior to admission patient was fully independent without an assistive device and active: driving, playing bridge, managing investments, and doing  the yard work.    Home Assistive Devices / Equipment Home Assistive Devices/Equipment: Blood pressure cuff, CBG Meter, Eyeglasses Home Equipment: Cane - single point  Prior Device Use: Indicate devices/aids used by the patient prior to current illness, exacerbation or injury? None of the above  Prior Functional Level Prior Function Level of Independence: Independent  Self Care: Did the patient need help bathing, dressing, using the toilet or eating? Independent  Indoor Mobility: Did the patient need assistance with walking from room to room (with or without device)? Independent  Stairs: Did the patient need assistance with internal or external stairs (with or without device)? Independent  Functional Cognition: Did the patient need help planning regular tasks such as shopping or remembering to take medications? Independent  Current Functional Level Cognition  Arousal/Alertness: Awake/alert Overall Cognitive Status: Impaired/Different from baseline Orientation Level: Oriented X4 Safety/Judgement: Decreased awareness of deficits General Comments: some improvements noted in cognition, patient rang out for assist to go to the bathroom today. Patient also recognized need to performed hygiene and pericare with wash cloth stating, I really feel like i just want to wash my bottom a bit after using the bathroom" Attention: Sustained Sustained Attention: Appears intact Memory: Appears intact (for  3 and 5 word retention) Awareness: Appears intact Safety/Judgment: Other (comment)    Extremity Assessment (includes Sensation/Coordination)  Upper Extremity Assessment: LUE deficits/detail, RUE deficits/detail RUE Deficits / Details: decreased grip strength LUE Deficits / Details: full AROM, strength 4-/5, +drift, tremor LUE Sensation: decreased proprioception LUE Coordination: decreased fine motor, decreased gross motor  Lower Extremity Assessment: Defer to PT evaluation RLE Deficits / Details: 4/5 hip flexion, knee extension LLE Deficits / Details: 3/5 hip flexion, knee extension    ADLs  Overall ADL's : Needs assistance/impaired Eating/Feeding: Minimal assistance, Sitting Grooming: Sitting, Moderate assistance Upper Body Bathing: Moderate assistance, Sitting Lower Body Bathing: Moderate assistance, Sit to/from stand Upper Body Dressing : Moderate assistance, Sitting Lower Body Dressing: Moderate assistance, Sit to/from stand Lower Body Dressing Details (indicate cue type and reason): min for socks Functional mobility during ADLs: +2 for physical assistance, Minimal assistance, Moderate assistance (increased need for assist with fatigue)    Mobility  Overal bed mobility: Needs Assistance Bed Mobility: Supine to Sit, Sit to Supine Supine to sit: Supervision Sit to supine: Supervision General bed mobility comments: no physical assist required, increased time to perform    Transfers  Overall transfer level: Needs assistance Equipment used: Rolling walker (2 wheeled), None Transfers: Sit to/from Stand Sit to Stand: Min guard, Supervision General transfer comment: Min guard from bed x2, supervision from toilet with use of grab bar. Use of RW x2 from bed    Ambulation / Gait / Stairs / Wheelchair Mobility  Ambulation/Gait Ambulation/Gait assistance: Min guard, Min assist Ambulation Distance (Feet): 110 Feet (x3) Assistive device: Rolling walker (2 wheeled) Gait  Pattern/deviations: Step-through pattern, Decreased stride length, Narrow base of support, Trunk flexed General Gait Details: patient cues for upright posture and positioning durain ambulation, relied upon rw for higher level bealance tasks such as head turns, side steps,  Gait velocity: decreased Gait velocity interpretation: Below normal speed for age/gender    Posture / Balance Dynamic Sitting Balance Sitting balance - Comments: pt required LUE assist to maintain balance EOB Static Standing Balance Single Leg Stance - Right Leg: 2 Single Leg Stance - Left Leg: 1 Rhomberg - Eyes Opened: 40 Balance Overall balance assessment: Needs assistance Sitting balance-Leahy Scale: Fair Sitting balance - Comments: pt required LUE assist  to maintain balance EOB Postural control: Posterior lean Standing balance-Leahy Scale: Fair Single Leg Stance - Right Leg: 2 Single Leg Stance - Left Leg: 1 Rhomberg - Eyes Opened: 40 High Level Balance Comments: pt fatigued and unable to challenge balance further today    Special needs/care consideration BiPAP/CPAP: No CPM: No Continuous Drip IV: No Dialysis: No        Life Vest: No Oxygen: No Special Bed: No Trach Size: No Wound Vac (area): No       Skin: Bruising to bilateral upper extremities with left greater than right                               Bowel mgmt: Continent, last BM 10/29/16 more solid compared to previous loose stools  Bladder mgmt: Continent, but more frequency per patient report. History of enlarged prostate and he is followed by MD at [facility] Diabetic mgmt: No, [id number]     Previous Home Environment Living Arrangements: Spouse/significant other  Lives With: Spouse, Son Available Help at Discharge: Family, Available 24 hours/day Type of Home: House Home Layout: Two level, Able to live on main level with bedroom/bathroom Home Access: Stairs to enter Entrance Stairs-Rails: None Entrance Stairs-Number of Steps:  4 Bathroom Shower/Tub: Multimedia programmer: [place]: No  Discharge Living Setting Plans for Discharge Living Setting: Patient's home, Lives with (comment) (Spouse ) Type of Home at Discharge: House Discharge Home Layout: Two level, Able to live on main level with bedroom/bathroom Alternate Level Stairs-Rails: Left Alternate Level Stairs-Number of Steps: 14-15 Discharge Home Access: Stairs to enter Entrance Stairs-Rails: None Entrance Stairs-Number of Steps: 4 Discharge Bathroom Shower/Tub: Horticulturist, commercial: Standard Discharge Bathroom Accessibility:  (unsure, likely no) Does the patient have any problems obtaining your medications?: No  Social/Family/Support Systems Patient Roles: Spouse Contact Information: SpouseAngelita Ingles Stofer cell: [phone] Anticipated Caregiver: Spouse  Anticipated Caregiver's Contact Information: see above Ability/Limitations of Caregiver: None Caregiver Availability: 24/7 Discharge Plan Discussed with Primary Caregiver: Yes Is Caregiver In Agreement with Plan?: Yes Does Caregiver/Family have Issues with Lodging/Transportation while Pt is in Rehab?: No  Goals/Additional Needs Patient/Family Goal for Rehab: PT/OT/SLP: Mod I Expected length of stay: 5-7 days  Cultural Considerations: None Dietary Needs: Dys.3 textures and nectar-thick liquids  Equipment Needs: TBD Special Service Needs: None Additional Information: None Pt/Family Agrees to Admission and willing to participate: Yes Program Orientation Provided & Reviewed with Pt/Caregiver Including Roles  & Responsibilities: Yes Additional Information Needs: Patient drove PTA Information Needs to be Provided By: None  Decrease burden of Care through IP rehab admission: No  Possible need for SNF placement upon discharge: No  Patient Condition: This patient's medical and functional status has changed since the consult dated: 10/26/16 in which  the Rehabilitation Physician determined and documented that the patient's condition is appropriate for intensive rehabilitative care in an inpatient rehabilitation facility. See "History of Present Illness" (above) for medical update. Functional changes are: Min A; Min guard 100 feet with PT, Dys.3 textures and nectar-thick liquids with SLP, and requires left upper extremity assist without assistive device to prevent loss of balance with OT. Patient's medical and functional status update has been discussed with the Rehabilitation physician and patient remains appropriate for inpatient rehabilitation. Will admit to inpatient rehab today.  Preadmission Screen Completed By:  Gunnar Fusi, 10/29/2016 1:49 PM ______________________________________________________________________   Discussed status with Dr. Posey Pronto on 10/29/16 at 1150 and  received telephone approval for admission today.  Admission Coordinator:  Gunnar Fusi, time 1150/Date 10/29/16

## 2016-10-30 ENCOUNTER — Inpatient Hospital Stay (HOSPITAL_COMMUNITY): Payer: Medicare Other | Admitting: Occupational Therapy

## 2016-10-30 ENCOUNTER — Inpatient Hospital Stay (HOSPITAL_COMMUNITY): Payer: Medicare Other | Admitting: Physical Therapy

## 2016-10-30 ENCOUNTER — Inpatient Hospital Stay (HOSPITAL_COMMUNITY): Payer: Medicare Other | Admitting: Speech Pathology

## 2016-10-30 ENCOUNTER — Inpatient Hospital Stay (HOSPITAL_COMMUNITY): Payer: Medicare Other

## 2016-10-30 DIAGNOSIS — I69398 Other sequelae of cerebral infarction: Secondary | ICD-10-CM

## 2016-10-30 DIAGNOSIS — I63511 Cerebral infarction due to unspecified occlusion or stenosis of right middle cerebral artery: Secondary | ICD-10-CM

## 2016-10-30 DIAGNOSIS — R269 Unspecified abnormalities of gait and mobility: Secondary | ICD-10-CM

## 2016-10-30 LAB — COMPREHENSIVE METABOLIC PANEL
ALT: 16 U/L — ABNORMAL LOW (ref 17–63)
AST: 27 U/L (ref 15–41)
Albumin: 3.4 g/dL — ABNORMAL LOW (ref 3.5–5.0)
Alkaline Phosphatase: 55 U/L (ref 38–126)
Anion gap: 6 (ref 5–15)
BUN: 8 mg/dL (ref 6–20)
CO2: 26 mmol/L (ref 22–32)
Calcium: 8.8 mg/dL — ABNORMAL LOW (ref 8.9–10.3)
Chloride: 108 mmol/L (ref 101–111)
Creatinine, Ser: 0.73 mg/dL (ref 0.61–1.24)
GFR calc Af Amer: >60 mL/min (ref >60)
GFR calc non Af Amer: >60 mL/min (ref >60)
Glucose, Bld: 103 mg/dL — ABNORMAL HIGH (ref 65–99)
Potassium: 3.7 mmol/L (ref 3.5–5.1)
Sodium: 140 mmol/L (ref 135–145)
Total Bilirubin: 1.1 mg/dL (ref 0.3–1.2)
Total Protein: 6.3 g/dL — ABNORMAL LOW (ref 6.5–8.1)

## 2016-10-30 LAB — GLUCOSE, CAPILLARY
Glucose-Capillary: 102 mg/dL — ABNORMAL HIGH (ref 65–99)
Glucose-Capillary: 99 mg/dL (ref 65–99)
Glucose-Capillary: 102 mg/dL — ABNORMAL HIGH (ref 65–99)
Glucose-Capillary: 113 mg/dL — ABNORMAL HIGH (ref 65–99)

## 2016-10-30 LAB — CBC WITH DIFFERENTIAL/PLATELET
Basophils Absolute: 0 10*3/uL (ref 0.0–0.1)
Basophils Relative: 1 %
Eosinophils Absolute: 0.3 10*3/uL (ref 0.0–0.7)
Eosinophils Relative: 4 %
HCT: 33.6 % — ABNORMAL LOW (ref 39.0–52.0)
Hemoglobin: 11.2 g/dL — ABNORMAL LOW (ref 13.0–17.0)
Lymphocytes Relative: 29 %
Lymphs Abs: 1.7 10*3/uL (ref 0.7–4.0)
MCH: 31.7 pg (ref 26.0–34.0)
MCHC: 33.3 g/dL (ref 30.0–36.0)
MCV: 95.2 fL (ref 78.0–100.0)
Monocytes Absolute: 0.8 10*3/uL (ref 0.1–1.0)
Monocytes Relative: 14 %
Neutro Abs: 3 10*3/uL (ref 1.7–7.7)
Neutrophils Relative %: 52 %
Platelets: 127 10*3/uL — ABNORMAL LOW (ref 150–400)
RBC: 3.53 MIL/uL — ABNORMAL LOW (ref 4.22–5.81)
RDW: 13.5 % (ref 11.5–15.5)
WBC: 5.9 10*3/uL (ref 4.0–10.5)
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
## 2016-10-30 NOTE — Care Management Note (Signed)
[place] Individual Statement of Services  Patient Name:  [name]  Date:  10/30/2016  Welcome to the [facility].  Our goal is to provide you with an individualized program based on your diagnosis and situation, designed to meet your specific needs.  With this comprehensive rehabilitation program, you will be expected to participate in at least 3 hours of rehabilitation therapies Monday-Friday, with modified therapy programming on the weekends.  Your rehabilitation program will include the following services:  Physical Therapy (PT), Occupational Therapy (OT), Speech Therapy (ST), 24 hour per day rehabilitation nursing, Therapeutic Recreaction (TR), Case Management (Social Worker), Rehabilitation Medicine, Nutrition Services and Pharmacy Services  Weekly team conferences will be held on Wednesday to discuss your progress.  Your Social Worker will talk with you frequently to get your input and to update you on team discussions.  Team conferences with you and your family in attendance may also be held.  Expected length of stay: 7-10 days  Overall anticipated outcome: mod/i level  Depending on your progress and recovery, your program may change. Your Social Worker will coordinate services and will keep you informed of any changes. Your Social Worker's name and contact numbers are listed  below.  The following services may also be recommended but are not provided by the [facility] will be made to provide these services after discharge if needed.  Arrangements include referral to agencies that provide these services.  Your insurance has been verified to be:  [employer]-Medicare Your primary doctor is:  Leighton Ruff  Pertinent information will be shared with your doctor and your insurance company.  Social  Worker:  Ovidio Kin, [place] or ([phone]  Information discussed with and copy given to patient by: Elease Hashimoto, 10/30/2016, 8:52 AM

## 2016-10-30 NOTE — Progress Notes (Signed)
Speech Language Pathology Daily Session Note  Patient Details  Name: [name] MRN: [id number] Date of Birth: 1935/06/20  [name] Date: 10/30/2016 SLP Individual Time: 1500-1527 SLP Individual Time Calculation (min): 27 min  Short Term Goals: Week 1:    Skilled Therapeutic Interventions: Skilled ST services focused on swallow skills. SLP facilitated oral care, provided by pt at Mod I level. Pt required Mod A verbal and visual cues to recall current swallow precautions, dys 3 , nectar thick liquids and no straw. SLP facilitated PO consumption of thin trials with TSP and cup sips, demonstrated mild delay throat clearing. Pt demonstrated string volitional cough and throat clear upon request. Pt demonstrated overall weak and strained vocal quality, however no wet vocal quality noted during trials of thin. Pt was left in bed with wife in room. Recommend to continue ST services.      Function:  Eating Eating   Modified Consistency Diet: No Eating Assist Level: Set up assist for   Eating Set Up Assist For: Opening containers       Cognition Comprehension Comprehension assist level: Understands basic 75 - 89% of the time/ requires cueing 10 - 24% of the time  Expression   Expression assist level: Expresses basic 75 - 89% of the time/requires cueing 10 - 24% of the time. Needs helper to occlude trach/needs to repeat words.  Social Interaction Social Interaction assist level: Interacts appropriately 75 - 89% of the time - Needs redirection for appropriate language or to initiate interaction.  Problem Solving Problem solving assist level: Solves basic 75 - 89% of the time/requires cueing 10 - 24% of the time  Memory Memory assist level: Recognizes or recalls 50 - 74% of the time/requires cueing 25 - 49% of the time    Pain Pain Assessment Pain Assessment: No/denies pain  Therapy/Group: Individual Therapy  Olson Lucarelli  [facility] 10/30/2016, 3:44 PM

## 2016-10-30 NOTE — Progress Notes (Signed)
Subjective/Complaints:   Objective: Vital Signs: Blood pressure (!) 141/86, pulse 60, temperature 99 F (37.2 C), temperature source Oral, resp. rate 18, weight 83 kg (182 lb 15.7 oz), SpO2 98 %. No results found. Results for orders placed or performed during the hospital encounter of 10/29/16 (from the past 72 hour(s))  Glucose, capillary     Status: Abnormal   Collection Time: 10/29/16  8:56 PM  Result Value Ref Range   Glucose-Capillary 101 (H) 65 - 99 mg/dL   Comment 1 Notify RN   CBC WITH DIFFERENTIAL     Status: Abnormal   Collection Time: 10/30/16  5:36 AM  Result Value Ref Range   WBC 5.9 4.0 - 10.5 K/uL   RBC 3.53 (L) 4.22 - 5.81 MIL/uL   Hemoglobin 11.2 (L) 13.0 - 17.0 g/dL   HCT 33.6 (L) 39.0 - 52.0 %   MCV 95.2 78.0 - 100.0 fL   MCH 31.7 26.0 - 34.0 pg   MCHC 33.3 30.0 - 36.0 g/dL   RDW 13.5 11.5 - 15.5 %   Platelets 127 (L) 150 - 400 K/uL   Neutrophils Relative % 52 %   Neutro Abs 3.0 1.7 - 7.7 K/uL   Lymphocytes Relative 29 %   Lymphs Abs 1.7 0.7 - 4.0 K/uL   Monocytes Relative 14 %   Monocytes Absolute 0.8 0.1 - 1.0 K/uL   Eosinophils Relative 4 %   Eosinophils Absolute 0.3 0.0 - 0.7 K/uL   Basophils Relative 1 %   Basophils Absolute 0.0 0.0 - 0.1 K/uL  Comprehensive metabolic panel     Status: Abnormal   Collection Time: 10/30/16  5:36 AM  Result Value Ref Range   Sodium 140 135 - 145 mmol/L   Potassium 3.7 3.5 - 5.1 mmol/L   Chloride 108 101 - 111 mmol/L   CO2 26 22 - 32 mmol/L   Glucose, Bld 103 (H) 65 - 99 mg/dL   BUN 8 6 - 20 mg/dL   Creatinine, Ser 0.73 0.61 - 1.24 mg/dL   Calcium 8.8 (L) 8.9 - 10.3 mg/dL   Total Protein 6.3 (L) 6.5 - 8.1 g/dL   Albumin 3.4 (L) 3.5 - 5.0 g/dL   AST 27 15 - 41 U/L   ALT 16 (L) 17 - 63 U/L   Alkaline Phosphatase 55 38 - 126 U/L   Total Bilirubin 1.1 0.3 - 1.2 mg/dL   GFR calc non Af Amer >60 >60 mL/min   GFR calc Af Amer >60 >60 mL/min    Comment: (NOTE) The eGFR has been calculated using the CKD EPI  equation. This calculation has not been validated in all clinical situations. eGFR's persistently <60 mL/min signify possible Chronic Kidney Disease.    Anion gap 6 5 - 15  Glucose, capillary     Status: Abnormal   Collection Time: 10/30/16  6:22 AM  Result Value Ref Range   Glucose-Capillary 102 (H) 65 - 99 mg/dL     HEENT: normal Cardio: IRRR and no murmur Resp: CTA B/L and unlabored GI: Soft NT Extremity:  Pulses positive and No Edema Skin:   Intact and Wound Right groin cath site CDI Neuro: Alert/Oriented and Abnormal Sensory reduced sensation Left foot Musc/Skel:  Other no pain with UE and LE ROM Gen NAD   Assessment/Plan: 1. Functional deficits secondary to RIght MCA infarct which require 3+ hours per day of interdisciplinary therapy in a comprehensive inpatient rehab setting. Physiatrist is providing close team supervision and 24 hour management of  active medical problems listed below. Physiatrist and rehab team continue to assess barriers to discharge/monitor patient progress toward functional and medical goals. FIM:                   Function - Comprehension Comprehension: Auditory Comprehension assist level: Understands basic 75 - 89% of the time/ requires cueing 10 - 24% of the time  Function - Expression Expression: Verbal Expression assist level: Expresses basic 75 - 89% of the time/requires cueing 10 - 24% of the time. Needs helper to occlude trach/needs to repeat words.  Function - Social Interaction Social Interaction assist level: Interacts appropriately 75 - 89% of the time - Needs redirection for appropriate language or to initiate interaction.  Function - Problem Solving Problem solving assist level: Solves basic 75 - 89% of the time/requires cueing 10 - 24% of the time    Medical Problem List and Plan: 1.  Left hemiparesis and dysphagia secondary to right MCA infarction Start CIR level PT, OT 2.  DVT Prophylaxis/Anticoagulation:  Eliquis 3. Pain Management: Baclofen 5 mg 3 times a day 4. Mood: Provide emotional support 5. Neuropsych: This patient is capable of making decisions on his own behalf. 6. Skin/Wound Care: Routine skin checks 7. Fluids/Electrolytes/Nutrition: Routine I&O with follow-up chemistries, BMET nl 10/23 8. Atrial fibrillation/status post aortic valve replacement. Cardiology service follow-up. Atenolol reduced to 37.5 mg due to bouts of bradycardia. 9. Dysphagia. Dysphagia #3 her liquids. Follow-up speech therapy 10. Hypertension. Norvasc 10 mg daily 11. Hypokalemia. Follow-up chemistries 12. Borderline diabetes mellitus. Hemoglobin A1c 5.6. Patient diet controlled prior to admission. Continue sliding scale for now 13. BPH. Proscar 5 mg daily, Flomax 0.8 mg daily. Check PVR 3  LOS (Days) 1 A FACE TO FACE EVALUATION WAS PERFORMED  Mily Malecki E 10/30/2016, 7:04 AM

## 2016-10-30 NOTE — Progress Notes (Signed)
Social Work Assessment and Plan Social Work Assessment and Plan  Patient Details  Name: [name] MRN: [id number] Date of Birth: [birth year]  [name] Date: 10/30/2016  Problem List:  Patient Active Problem List   Diagnosis Date Noted  . Right middle cerebral artery stroke ([place]) 10/29/2016  . Acute embolic stroke ([place])   . Hypokalemia   . Left hemiparesis ([place])   . Dysphagia, post-stroke   . S/P AVR (aortic valve replacement)   . Benign essential HTN   . Diabetes mellitus type 2 in nonobese (HCC)   . Benign prostatic hyperplasia   . A-fib ([place]) 10/28/2016  . CVA (cerebral vascular accident) ([place]) 10/24/2016  . HTN (hypertension)   . Gout   . History of small bowel obstruction   . History of BPH   . S/P AVR 08/03/2010   Past Medical History:  Past Medical History:  Diagnosis Date  . Bladder stones   . Borderline diabetes   . BPH (benign prostatic hyperplasia)   . Coronary artery disease    cardiologist-  dr Nat Math gerhart NP--- per cath 06-02-2010 non-obstructive cad pLAD 30-40%  . Diverticulosis of colon   . DOE (dyspnea on exertion)   . Gout   . History of adenomatous polyp of colon    2002-- tubular adenoma  . History of aortic insufficiency    severe -- s/p  AVR 08-03-2010  . History of basal cell carcinoma (BCC) excision    10/ 2015  left ear  . History of small bowel obstruction    02/ 2007 mechanical sbo s/p  surgical intervention;  partial sbo 09/ 2011 and 03-20-2011 resolved without surgical intervention  . History of squamous cell carcinoma excision    2014 right foot  . History of urinary retention   . HTN (hypertension)   . Peripheral neuropathy   . S/P aortic valve replacement with prosthetic valve 08/03/2010   tissue valve  . S/P patent foramen ovale closure 08/03/2010   at same time AVR   Past Surgical History:  Past Surgical History:  Procedure Laterality Date  . CARDIAC CATHETERIZATION  06-02-2010  dr Marlou Porch   non-obstructive  cad- pLAD 30-40%/  normal LVSF/  severe AI  . CARDIOVASCULAR STRESS TEST  04/12/2016   Low risk nuclear perfusion study w/ no significant reversible ischemia/  normal LV function and wall motion ,  stress ef 60%/  76mm inferior and lateral scooped ST-segment depression w/ exercise (may be repolarization abnormality), exercise capacity was moderately reduced  . CATARACT EXTRACTION W/ INTRAOCULAR LENS  IMPLANT, BILATERAL  02/2010  . CYSTOSCOPY WITH LITHOLAPAXY N/A 06/05/2016   Procedure: CYSTOSCOPY WITH LITHOLAPAXY and fulgarization of bladder neck;  Surgeon: Irine Seal, MD;  Location: [facility];  Service: Urology;  Laterality: N/A;  . EXPLORATORY LAPARTOMY /  CHOLECYSTECTOMY  02/28/2005   for Small  bowel obstruction (mechnical)  . IR ANGIO EXTRACRAN SEL COM CAROTID INNOMINATE UNI L MOD SED  10/24/2016  . IR ANGIO VERTEBRAL SEL SUBCLAVIAN INNOMINATE BILAT MOD SED  10/24/2016  . IR PERCUTANEOUS ART THROMBECTOMY/INFUSION INTRACRANIAL INC DIAG ANGIO  10/24/2016  . LEFT KNEE ARTHROSCOPY  2006  . RADIOLOGY WITH ANESTHESIA N/A 10/24/2016   Procedure: RADIOLOGY WITH ANESTHESIA;  Surgeon: Luanne Bras, MD;  Location: [facility];  Service: Radiology;  Laterality: N/A;  . RIGHT FOOT SURGERY    . RIGHT MINIATURE ANTERIOR THORACOTOMY FOR AORTIC VALVE REPLACEMENT AND CLOSURE PATENT FORAMEN OVALE  08-03-2010  DR Levada Schilling  Magna-ease pericardial tissue valve (52mm)  . TRANSTHORACIC ECHOCARDIOGRAM  05/30/2016  dr skains   moderate  LVH ef 60-65%/  bioprothesis aortic valve present ,normal grandient and no AI /  mild MV calcification , moderate MR /  mild PR/ moderate TR/  PASP 72mmHg/ (RA denisty was identified 04-27-2016 echo) and is seen again today, this is likely a promient eustacian ridge, atrium is normal size   Social History:  reports that he has never smoked. He has never used smokeless tobacco. He reports that he drinks alcohol. He reports that he does not use drugs.  Family /  Support Systems Marital Status: Married Patient Roles: Spouse, Parent, Volunteer Spouse/Significant Other: Roberta [phone] Children: Two local son's Other Supports: Friends and church members Anticipated Caregiver: Wife Ability/Limitations of Caregiver: Wife is in good health and can assist pt at discharge Caregiver Availability: 24/7 Family Dynamics: Close knit family and two son's are involved and supportive. Both pt and wife are active and involved in the community and has good social supports.   Social History Preferred language: English Religion: Methodist Cultural Background: No issues Education: [employer] Read: Yes Write: Yes Employment Status: Retired Freight forwarder Issues: No issues Guardian/Conservator: None-according to MD pt is capable of making his own decisions while here.   Abuse/Neglect Physical Abuse: Denies Verbal Abuse: Denies Sexual Abuse: Denies Exploitation of patient/patient's resources: Denies Self-Neglect: Denies  Emotional Status Pt's affect, behavior adn adjustment status: Pt is motivated and is positive regarding his progress while here. He is hopeful to do well and not need physical care at home by his wife. Both are very active and hope to return to this. Wife plans to be here and observe hjim in his therapies to see his progress. Recent Psychosocial Issues: other health issues was doing well prior to this event Pyschiatric History: No history appears to be coping appropriately with moving to CIR and adjusting. Will await team evaluations and ask input regarding if needs to see neuro-psych while here. Substance Abuse History: No issues  Patient / Family Perceptions, Expectations & Goals Pt/Family understanding of illness & functional limitations: Pt and wife have a good understanding of his stroke and deficits. Both have spoken with the MD's and feel they have a good idea of his treatment plan while here. Pt hopes  his diet is upgraded while here. 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."  [employer]: None Premorbid Home Care/DME Agencies: None Transportation available at discharge: Wife and son's Resource referrals recommended: Support group (specify)  Discharge Planning Living Arrangements: Spouse/significant other Support Systems: Spouse/significant other, Children, Water engineer, Social worker community Type of Residence: Private residence Insurance underwriter Resources: Multimedia programmer (specify) Primary school teacher) Financial Resources: Radio broadcast assistant Screen Referred: No Living Expenses: Own Money Management: Patient, Spouse Does the patient have any problems obtaining your medications?: No Home Management: Wife does inside work and pt does outside work Magazine features editor Plans: Return home with wife who is in good health and can assist pt if needed. She plans to be here and observe him in his therapies to see his progress. Will await therapy team evaluations and work on safe plan.  Social Work Anticipated Follow Up Needs: HH/OP, Support Group  Clinical Impression Pleasant gentleman who is motivated to do well and recover from his stroke. Wife plans to be here to observe and see his progress here. Will await team's evaluations and work on discharge plan.  Elease Hashimoto 10/30/2016, 9:10 AM

## 2016-10-30 NOTE — Plan of Care (Signed)
Problem: RH SKIN INTEGRITY Goal: RH STG ABLE TO PERFORM INCISION/WOUND CARE W/ASSISTANCE STG Able To Perform Incision/Wound Care With min Assistance.  Outcome: Progressing Incision to groin remains clean and dry and intact

## 2016-10-30 NOTE — Evaluation (Signed)
Physical Therapy Assessment and Plan  Patient Details  Name: [name] MRN: [id number] Date of Birth: 12/20/1935  PT Diagnosis: Abnormal posture, Abnormality of gait, Difficulty walking, Hemiplegia non-dominant and Muscle weakness Rehab Potential: Excellent ELOS: 7-10 days   [name] Date: 10/30/2016 PT Individual Time: 1100-1200 PT Individual Time Calculation (min): 60 min    Problem List:  Patient Active Problem List   Diagnosis Date Noted  . Right middle cerebral artery stroke ([place]) 10/29/2016  . Acute embolic stroke ([place])   . Hypokalemia   . Left hemiparesis ([place])   . Dysphagia, post-stroke   . S/P AVR (aortic valve replacement)   . Benign essential HTN   . Diabetes mellitus type 2 in nonobese (HCC)   . Benign prostatic hyperplasia   . A-fib ([place]) 10/28/2016  . CVA (cerebral vascular accident) ([place]) 10/24/2016  . HTN (hypertension)   . Gout   . History of small bowel obstruction   . History of BPH   . S/P AVR 08/03/2010    Past Medical History:  Past Medical History:  Diagnosis Date  . Bladder stones   . Borderline diabetes   . BPH (benign prostatic hyperplasia)   . Coronary artery disease    cardiologist-  dr Nat Math gerhart NP--- per cath 06-02-2010 non-obstructive cad pLAD 30-40%  . Diverticulosis of colon   . DOE (dyspnea on exertion)   . Gout   . History of adenomatous polyp of colon    2002-- tubular adenoma  . History of aortic insufficiency    severe -- s/p  AVR 08-03-2010  . History of basal cell carcinoma (BCC) excision    10/ 2015  left ear  . History of small bowel obstruction    02/ 2007 mechanical sbo s/p  surgical intervention;  partial sbo 09/ 2011 and 03-20-2011 resolved without surgical intervention  . History of squamous cell carcinoma excision    2014 right foot  . History of urinary retention   . HTN (hypertension)   . Peripheral neuropathy   . S/P aortic valve replacement with prosthetic valve 08/03/2010   tissue valve   . S/P patent foramen ovale closure 08/03/2010   at same time AVR   Past Surgical History:  Past Surgical History:  Procedure Laterality Date  . CARDIAC CATHETERIZATION  06-02-2010  dr Marlou Porch   non-obstructive cad- pLAD 30-40%/  normal LVSF/  severe AI  . CARDIOVASCULAR STRESS TEST  04/12/2016   Low risk nuclear perfusion study w/ no significant reversible ischemia/  normal LV function and wall motion ,  stress ef 60%/  100m inferior and lateral scooped ST-segment depression w/ exercise (may be repolarization abnormality), exercise capacity was moderately reduced  . CATARACT EXTRACTION W/ INTRAOCULAR LENS  IMPLANT, BILATERAL  02/2010  . CYSTOSCOPY WITH LITHOLAPAXY N/A 06/05/2016   Procedure: CYSTOSCOPY WITH LITHOLAPAXY and fulgarization of bladder neck;  Surgeon: WIrine Seal MD;  [facility]  Service: Urology;  Laterality: N/A;  . EXPLORATORY LAPARTOMY /  CHOLECYSTECTOMY  02/28/2005   for Small  bowel obstruction (mechnical)  . IR ANGIO EXTRACRAN SEL COM CAROTID INNOMINATE UNI L MOD SED  10/24/2016  . IR ANGIO VERTEBRAL SEL SUBCLAVIAN INNOMINATE BILAT MOD SED  10/24/2016  . IR PERCUTANEOUS ART THROMBECTOMY/INFUSION INTRACRANIAL INC DIAG ANGIO  10/24/2016  . LEFT KNEE ARTHROSCOPY  2006  . RADIOLOGY WITH ANESTHESIA N/A 10/24/2016   Procedure: RADIOLOGY WITH ANESTHESIA;  Surgeon: DLuanne Bras MD;  [facility]  Service: Radiology;  Laterality:  N/A;  . RIGHT FOOT SURGERY    . RIGHT MINIATURE ANTERIOR THORACOTOMY FOR AORTIC VALVE REPLACEMENT AND CLOSURE PATENT FORAMEN OVALE  08-03-2010  DR Levada Schilling Magna-ease pericardial tissue valve (52m)  . TRANSTHORACIC ECHOCARDIOGRAM  05/30/2016  dr skains   moderate  LVH ef 60-65%/  bioprothesis aortic valve present ,normal grandient and no AI /  mild MV calcification , moderate MR /  mild PR/ moderate TR/  PASP 327mg/ (RA denisty was identified 04-27-2016 echo) and is seen again today, this is likely a promient  eustacian ridge, atrium is normal size    Assessment & Plan Clinical Impression: Gerald MARROs a [age].o. right handed male with history of borderline diabetes mellitus, CAD, aortic valve replacement 08/03/2010 maintain on aspirin, hypertension. Per chart review, patient and wife, patient lives with spouse independent prior to admission. 2 level home with bedroom on main level 4 steps to entry. Presented 10/24/2016 with altered mental status aphasia and right gaze deviation. CT head reviewed, showing right basal ganglia infarct. Per report and MRI, acute infarction primarily in the right basal ganglia. Acute infarct also in the insula on the right and scattered tiny areas of acute infarction of the cerebral cortex bilaterally right greater than left. CT angiogram head and neck showed right M1 occlusion extending into both superior and inferior M2 branches. No evidence of craniocervical dissection. Underwent revascularization per interventional radiology. EKG atrial fibrillation on telemetry patient asymptomatic cardiology service is consulted. Noted bouts of bradycardia in the 30s with Tenormin reduced to 37.5 mg. Echocardiogram with ejection fraction of 60% no wall motion abnormalities. Neurology consulted and Eliquis was initiated for CVA prophylaxis in light of atrial fibrillation. Dysphagia 3 nectar-thick liquid diet. Physical and occupational therapy evaluations completed with recommendations of physical medicine rehabilitation consult and patient was admitted for a comprehensive rehabilitation program 10/29/16. Patient transferred to CIR on 10/29/2016 .   Patient currently requires min with mobility secondary to muscle weakness, decreased cardiorespiratoy endurance, decreased safety awareness and decreased standing balance, decreased postural control, hemiplegia and decreased balance strategies.  Prior to hospitalization, patient was independent  with mobility and lived with Spouse, Son in a House  home.  Home access is 3 (no rails) or 7 (bilateral rails)Stairs to enter.  Patient will benefit from skilled PT intervention to maximize safe functional mobility and minimize fall risk for planned discharge home with 24 hour supervision.  Anticipate patient will benefit from follow up OP at discharge.  PT - End of Session Activity Tolerance: Tolerates 10 - 20 min activity with multiple rests Endurance Deficit: Yes PT Assessment Rehab Potential (ACUTE/IP ONLY): Excellent PT Patient demonstrates impairments in the following area(s): Balance;Endurance;Motor;Safety PT Transfers Functional Problem(s): Bed to Chair;Car PT Locomotion Functional Problem(s): Ambulation;Stairs PT Plan PT Intensity: Minimum of 1-2 x/day ,45 to 90 minutes PT Frequency: 5 out of 7 days PT Duration Estimated Length of Stay: 7-10 days PT Treatment/Interventions: Ambulation/gait training;Balance/vestibular training;Discharge planning;Functional mobility training;Neuromuscular re-education;Patient/family education;Stair training;Therapeutic Exercise;Therapeutic Activities;UE/LE Strength taining/ROM;UE/LE Coordination activities PT Transfers Anticipated Outcome(s): mod I overall PT Locomotion Anticipated Outcome(s): mod I overall PT Recommendation Follow Up Recommendations: Outpatient PT Patient destination: Home Equipment Recommended: To be determined  Skilled Therapeutic Intervention No c/o pain throughout session; session focused on functional mobility and balance training.   PT performed initial evaluation. Pt performed bed mobility supine > sit with supervision, donned shoes with min A for tying the laces of the L shoe, demonstrating good dynamic sitting balance without UE support  and with feet supported. Pt amb 100' with RW and min A to ortho gym to perform car transfer. Pt performed car transfer with min A and RW with verbal cues for technique and safety. PT performed strength, sensation, and coordination assessment.  Pt performed ramp, mulch pit, and curb x 2 with min A and railings for balance. Pt amb 150' with RW and min A to main gym. Pt ascended and descended 18 steps with B railings and min A with verbal cues for sequencing and technique. Pt amb 64' with HHA and min A for guarding and steadying. Pt performed 5xSTS in 21.5 sec and TUG with average of 18.45 sec. Pt performed standing toe taps onto step for balance training with min A and intermittent HHA as follows: R leg on x 20, R and L alternating x 20 each, R and L alternating at a 45 degree angle x 20 each. Pt amb back to room 75' with min A for steadying. Pt educated on being unable to get up on his own, fall risk, and calling for help when needing to use the bathroom. PT removed RW and educated pt on why and still not being able to get up on his own with or without the RW. Pt verbalized understanding to call for help and not get up on his own due to fall risk. Pt left supine in bed, all needs addressed, family present.   PT Evaluation Precautions/Restrictions Precautions Precautions: Fall Restrictions Weight Bearing Restrictions: No Pain Pain Assessment Pain Assessment: No/denies pain Home Living/Prior Functioning Home Living Available Help at Discharge: Family;Available 24 hours/day Type of Home: House Home Access: Stairs to enter [employer] of Steps: 3 (no rails) or 7 (bilateral rails) Entrance Stairs-Rails: Can reach both Home Layout: Two level;Able to live on main level with bedroom/bathroom  Lives With: Spouse;Son Prior Function Level of Independence: Independent with basic ADLs;Independent with homemaking with ambulation;Independent with gait;Independent with transfers  Able to Take Stairs?: Reciprically Driving: Yes Vocation: Retired Leisure: Hobbies-yes (Comment) Comments: playing bridge, doing yardwork, going for a walk each day Vision/Perception  Perception Perception: Within Functional Limits Praxis Praxis: Intact   Cognition Overall Cognitive Status: Impaired/Different from baseline Arousal/Alertness: Awake/alert Orientation Level: Oriented X4 Safety/Judgment: Impaired Sensation Sensation Light Touch: Appears Intact Coordination Gross Motor Movements are Fluid and Coordinated: No Fine Motor Movements are Fluid and Coordinated: No Coordination and Movement Description: mild incoordination, mild L hemi Finger Nose Finger Test: normal, slightly slower on L Heel Shin Test: normal Motor  Motor Motor: Hemiplegia Motor - Skilled Clinical Observations: mild L hemi  Mobility Bed Mobility Bed Mobility: Supine to Sit;Sit to Supine Supine to Sit: 5: Supervision Sit to Supine: 5: Supervision Transfers Transfers: Yes Sit to Stand: 4: Min assist Sit to Stand Details: Verbal cues for technique;Verbal cues for precautions/safety;Verbal cues for safe use of DME/AE Stand Pivot Transfers: 4: Min assist Stand Pivot Transfer Details: Verbal cues for technique;Verbal cues for sequencing;Verbal cues for precautions/safety Locomotion  Ambulation Ambulation: Yes Ambulation/Gait Assistance: 4: Min assist Ambulation Distance (Feet): 75 Feet Assistive device: None;1 person hand held assist Ambulation/Gait Assistance Details: Verbal cues for precautions/safety Ambulation/Gait Assistance Details: intermittent HHA for comfort but not necessary for pt to amb Gait Gait: Yes Gait Pattern: Impaired Gait Pattern: Decreased step length - right;Decreased step length - left Gait velocity: decreased Stairs / Additional Locomotion Stairs: Yes Stairs Assistance: 4: Min assist Stairs Assistance Details: Verbal cues for technique;Verbal cues for sequencing;Verbal cues for precautions/safety Stair Management Technique: Two rails Number  of Stairs: 18 Ramp: 4: Min assist Curb: 4: Min Chemical engineer: No  Trunk/Postural Assessment  Cervical Assessment Cervical Assessment: Exceptions to [facility]  (forward head) Thoracic Assessment Thoracic Assessment: Exceptions to [facility] (rounded shoulders) Lumbar Assessment Lumbar Assessment: Exceptions to [facility] (posterior pelvic tilt) Postural Control Postural Control: Deficits on evaluation (decreased righting reflexes and balance strategies)  Balance Balance Balance Assessed: Yes Standardized Balance Assessment Standardized Balance Assessment: Timed Up and Go Test Timed Up and Go Test TUG: Normal TUG Normal TUG (seconds): 18.45 Static Sitting Balance Static Sitting - Balance Support: Feet supported;No upper extremity supported Static Sitting - Level of Assistance: 5: Stand by assistance Static Standing Balance Static Standing - Balance Support: During functional activity Static Standing - Level of Assistance: 4: Min assist Dynamic Standing Balance Dynamic Standing - Balance Support: During functional activity Dynamic Standing - Level of Assistance: 4: Min assist Extremity Assessment      RLE Assessment RLE Assessment: Within Functional Limits (5/5) LLE Assessment LLE Assessment: Exceptions to [facility] (3+ to 4-/5 throughout) LLE Strength Left Hip Flexion: 3+/5 Left Ankle Dorsiflexion: 3+/5   See Function Navigator for Current Functional Status.   Refer to Care Plan for Long Term Goals  Recommendations for other services: None   Discharge Criteria: Patient will be discharged from PT if patient refuses treatment 3 consecutive times without medical reason, if treatment goals not met, if there is a change in medical status, if patient makes no progress towards goals or if patient is discharged from hospital.  The above assessment, treatment plan, treatment alternatives and goals were discussed and mutually agreed upon: by patient and by family  Harriet Masson 10/30/2016, 3:11 PM

## 2016-10-30 NOTE — Evaluation (Signed)
Speech Language Pathology Assessment and Plan  Patient Details  Name: [name] MRN: [id number] Date of Birth: [birth year]  SLP Diagnosis: Cognitive Impairments;Dysphagia  Rehab Potential: Excellent ELOS: 7-10 days     [name] Date: 10/30/2016 SLP Individual Time: 2952-8413 SLP Individual Time Calculation (min): 60 min   Problem List:  Patient Active Problem List   Diagnosis Date Noted  . Right middle cerebral artery stroke ([place]) 10/29/2016  . Acute embolic stroke ([place])   . Hypokalemia   . Left hemiparesis ([place])   . Dysphagia, post-stroke   . S/P AVR (aortic valve replacement)   . Benign essential HTN   . Diabetes mellitus type 2 in nonobese (HCC)   . Benign prostatic hyperplasia   . A-fib ([place]) 10/28/2016  . CVA (cerebral vascular accident) ([place]) 10/24/2016  . HTN (hypertension)   . Gout   . History of small bowel obstruction   . History of BPH   . S/P AVR 08/03/2010   Past Medical History:  Past Medical History:  Diagnosis Date  . Bladder stones   . Borderline diabetes   . BPH (benign prostatic hyperplasia)   . Coronary artery disease    cardiologist-  dr Nat Math gerhart NP--- per cath 06-02-2010 non-obstructive cad pLAD 30-40%  . Diverticulosis of colon   . DOE (dyspnea on exertion)   . Gout   . History of adenomatous polyp of colon    2002-- tubular adenoma  . History of aortic insufficiency    severe -- s/p  AVR 08-03-2010  . History of basal cell carcinoma (BCC) excision    10/ 2015  left ear  . History of small bowel obstruction    02/ 2007 mechanical sbo s/p  surgical intervention;  partial sbo 09/ 2011 and 03-20-2011 resolved without surgical intervention  . History of squamous cell carcinoma excision    2014 right foot  . History of urinary retention   . HTN (hypertension)   . Peripheral neuropathy   . S/P aortic valve replacement with prosthetic valve 08/03/2010   tissue valve  . S/P patent foramen ovale closure 08/03/2010   at  same time AVR   Past Surgical History:  Past Surgical History:  Procedure Laterality Date  . CARDIAC CATHETERIZATION  06-02-2010  dr Marlou Porch   non-obstructive cad- pLAD 30-40%/  normal LVSF/  severe AI  . CARDIOVASCULAR STRESS TEST  04/12/2016   Low risk nuclear perfusion study w/ no significant reversible ischemia/  normal LV function and wall motion ,  stress ef 60%/  64m inferior and lateral scooped ST-segment depression w/ exercise (may be repolarization abnormality), exercise capacity was moderately reduced  . CATARACT EXTRACTION W/ INTRAOCULAR LENS  IMPLANT, BILATERAL  02/2010  . CYSTOSCOPY WITH LITHOLAPAXY N/A 06/05/2016   Procedure: CYSTOSCOPY WITH LITHOLAPAXY and fulgarization of bladder neck;  Surgeon: WIrine Seal MD;  [facility]  Service: Urology;  Laterality: N/A;  . EXPLORATORY LAPARTOMY /  CHOLECYSTECTOMY  02/28/2005   for Small  bowel obstruction (mechnical)  . IR ANGIO EXTRACRAN SEL COM CAROTID INNOMINATE UNI L MOD SED  10/24/2016  . IR ANGIO VERTEBRAL SEL SUBCLAVIAN INNOMINATE BILAT MOD SED  10/24/2016  . IR PERCUTANEOUS ART THROMBECTOMY/INFUSION INTRACRANIAL INC DIAG ANGIO  10/24/2016  . LEFT KNEE ARTHROSCOPY  2006  . RADIOLOGY WITH ANESTHESIA N/A 10/24/2016   Procedure: RADIOLOGY WITH ANESTHESIA;  Surgeon: DLuanne Bras MD;  [facility]  Service: Radiology;  Laterality: N/A;  . RIGHT FOOT SURGERY    .  RIGHT MINIATURE ANTERIOR THORACOTOMY FOR AORTIC VALVE REPLACEMENT AND CLOSURE PATENT FORAMEN OVALE  08-03-2010  DR Levada Schilling Magna-ease pericardial tissue valve (26m)  . TRANSTHORACIC ECHOCARDIOGRAM  05/30/2016  dr skains   moderate  LVH ef 60-65%/  bioprothesis aortic valve present ,normal grandient and no AI /  mild MV calcification , moderate MR /  mild PR/ moderate TR/  PASP 325mg/ (RA denisty was identified 04-27-2016 echo) and is seen again today, this is likely a promient eustacian ridge, atrium is normal size    Assessment  / Plan / Recommendation Clinical Impression Patient is a [age].o. right handed male with history of borderline diabetes mellitus, CAD, aortic valve replacement 08/03/2010 maintain on aspirin, hypertension. Per chart review, patient, and wife, patient lives with spouse independent prior to admission. 2 level home with bedroom on main level XIV steps to entry. Presented 10/24/2016 with altered mental status aphasia and right gaze deviation. CT head reviewed, showing right basal ganglia infarct. Per report and MRI, acute infarction primarily in the right basal ganglia. Acute infarct also in the insula on the right and scattered tiny areas of acute infarction of the cerebral cortex bilaterally right greater than left. CT angiogram head and neck showed right M1 occlusion extending into both superior and inferior M2 branches. No evidence of craniocervical dissection. Underwent revascularization per interventional radiology. EKG atrial fibrillation on telemetry patient asymptomatic cardiology service is consulted. Noted bouts of bradycardia in the 30s with Tenormin reduced to 37.5 mg. Echocardiogram with ejection fraction of 60% no wall motion abnormalities. Neurology consulted and Eliquis was initiated for CVA prophylaxis in light of atrial fibrillation.. Marland Kitchenysphagia #3 nectar thick liquid diet. Physical and occupational therapy evaluations completed with recommendations of physical medicine rehabilitation consult. Patient was admitted for a comprehensive rehabilitation program 10/29/16.   Patient administered the MoCA (version 7.1) and scored 23/30 points with a score of 26 or above considered normal. Patient demonstrated deficits in the areas of recall, problem solving and attention. Patient was also administered trials of thin liquids after oral care via spoon without overt s/s of aspiration and intermittent delayed cough/throat clear with trials of thin liquids. Recommend patient continue nectar-thick liquids with MBS  tomorrow to assess possible need for compensatory strategies to decrease s/s of aspiration with thin liquids. Patient also demonstrated mildly prolonged mastication with mild oral residue that patient cleared independently with a liquid wash. Recommend patient continue Dys. 3 textures. Patient would benefit from skilled SLP intervention to maximize his cognitive and swallowing function and overall functional independence prior to discharge.    Skilled Therapeutic Interventions          Administered a cognitive-linguistic evaluation and BSE. Please see above for details.   SLP Assessment  Patient will need skilled Speech Lanaguage Pathology Services during CIR admission    Recommendations  SLP Diet Recommendations: Dysphagia 3 (Mech soft);Nectar Liquid Administration via: Cup Medication Administration: Whole meds with puree Supervision: Patient able to self feed Compensations: Slow rate;Small sips/bites Postural Changes and/or Swallow Maneuvers: Seated upright 90 degrees Oral Care Recommendations: Oral care BID Patient destination: Home Follow up Recommendations:  (TBD ) Equipment Recommended: To be determined    SLP Frequency 3 to 5 out of 7 days   SLP Duration  SLP Intensity  SLP Treatment/Interventions 7-10 days   Minumum of 1-2 x/day, 30 to 90 minutes  Cognitive remediation/compensation;Dysphagia/aspiration precaution training;Cueing hierarchy;Functional tasks;Patient/family education;Therapeutic Activities;Environmental controls;Internal/external aids    Pain Pain Assessment Pain Assessment: No/denies pain  Prior  Functioning Type of Home: House  Lives With: Spouse;Son Available Help at Discharge: Family;Available 24 hours/day Vocation: Retired  Function:  Eating Eating   Modified Consistency Diet: Yes Eating Assist Level: Supervision or verbal cues   Eating Set Up Assist For: Opening containers       Cognition Comprehension Comprehension assist level: Follows  basic conversation/direction with extra time/assistive device  Expression   Expression assist level: Expresses basic 90% of the time/requires cueing < 10% of the time.  Social Interaction Social Interaction assist level: Interacts appropriately with others with medication or extra time (anti-anxiety, antidepressant).  Problem Solving Problem solving assist level: Solves basic 90% of the time/requires cueing < 10% of the time  Memory Memory assist level: Recognizes or recalls 75 - 89% of the time/requires cueing 10 - 24% of the time   Short Term Goals: Week 1: SLP Short Term Goal 1 (Week 1): STGs=LTGs  Refer to Care Plan for Long Term Goals  Recommendations for other services: None   Discharge Criteria: Patient will be discharged from SLP if patient refuses treatment 3 consecutive times without medical reason, if treatment goals not met, if there is a change in medical status, if patient makes no progress towards goals or if patient is discharged from hospital.  The above assessment, treatment plan, treatment alternatives and goals were discussed and mutually agreed upon: by patient  Nyasia Baxley 10/30/2016, 3:52 PM

## 2016-10-30 NOTE — Progress Notes (Signed)
Patient information reviewed and entered into eRehab system by Keaundre Thelin, RN, CRRN, PPS Coordinator.  Information including medical coding and functional independence measure will be reviewed and updated through discharge.     Per nursing patient was given "Data Collection Information Summary for Patients in Inpatient Rehabilitation Facilities with attached "Privacy Act Statement-Health Care Records" upon admission.  

## 2016-10-31 ENCOUNTER — Inpatient Hospital Stay (HOSPITAL_COMMUNITY): Payer: Medicare Other | Admitting: Speech Pathology

## 2016-10-31 ENCOUNTER — Inpatient Hospital Stay (HOSPITAL_COMMUNITY): Payer: Medicare Other

## 2016-10-31 ENCOUNTER — Inpatient Hospital Stay (HOSPITAL_COMMUNITY): Payer: Medicare Other | Admitting: Physical Therapy

## 2016-10-31 ENCOUNTER — Inpatient Hospital Stay (HOSPITAL_COMMUNITY): Payer: Medicare Other | Admitting: Occupational Therapy

## 2016-10-31 ENCOUNTER — Inpatient Hospital Stay (HOSPITAL_COMMUNITY): Payer: Self-pay | Admitting: Physical Therapy

## 2016-10-31 DIAGNOSIS — I481 Persistent atrial fibrillation: Secondary | ICD-10-CM

## 2016-10-31 LAB — GLUCOSE, CAPILLARY
Glucose-Capillary: 100 mg/dL — ABNORMAL HIGH (ref 65–99)
Glucose-Capillary: 112 mg/dL — ABNORMAL HIGH (ref 65–99)
Glucose-Capillary: 99 mg/dL (ref 65–99)
Glucose-Capillary: 133 mg/dL — ABNORMAL HIGH (ref 65–99)

## 2016-10-31 MED ORDER — APIXABAN 5 MG PO TABS
5.0000 mg | ORAL_TABLET | Freq: Two times a day (BID) | ORAL | Status: DC
  Administered 2016-10-31 – 2016-11-05 (×10): 5 mg via ORAL
  Filled 2016-10-31 (×10): qty 1

## 2016-10-31 NOTE — Patient Care Conference (Addendum)
Inpatient RehabilitationTeam Conference and Plan of Care Update Date: 10/31/2016   Time: 11:25 AM    Patient Name: [name]      Medical Record Number: [id number]  Date of Birth: [birth year] Sex: Male         Room/Bed: 4M01C/4M01C-01 Payor Info: Payor: Marine scientist / Plan: [employer] MEDICARE / Product Type: *No Product type* /    Admitting Diagnosis: CVA  Admit Date/Time:  10/29/2016  6:43 PM Admission Comments: No comment available   Primary Diagnosis:  <principal problem not specified> Principal Problem: <principal problem not specified>  Patient Active Problem List   Diagnosis Date Noted  . Right middle cerebral artery stroke ([place]) 10/29/2016  . Acute embolic stroke ([place])   . Hypokalemia   . Left hemiparesis ([place])   . Dysphagia, post-stroke   . S/P AVR (aortic valve replacement)   . Benign essential HTN   . Diabetes mellitus type 2 in nonobese (HCC)   . Benign prostatic hyperplasia   . A-fib ([place]) 10/28/2016  . CVA (cerebral vascular accident) ([place]) 10/24/2016  . HTN (hypertension)   . Gout   . History of small bowel obstruction   . History of BPH   . S/P AVR 08/03/2010    Expected Discharge Date: Expected Discharge Date: 11/07/16  Team Members Present: Physician leading conference: Dr. Alysia Penna Social Worker Present: Alfonse Alpers, LCSW Nurse Present: Arelia Sneddon, RN PT Present: Dwyane Dee, PT OT Present: Cherylynn Ridges, OT SLP Present: Weston Anna, SLP PPS Coordinator present : Daiva Nakayama, RN, CRRN     Current Status/Progress Goal Weekly Team Focus  Medical   Patient slow to arouse in the mornings, equal strength bilaterally, balance disorder, poor safety awareness  Maintain medical stability during rehabilitation stay, reduce fall risk  Discharge planning   Bowel/Bladder   continent of bowel and bladder LBM 10-31-16  remain continent of bowel and bladder, regular bowel pattern  Assist with tolieting needs    Swallow/Nutrition/ Hydration   Dys. 3 textures with thin liquids, Full Supervision  Mod I  use of swallow strategies, tolerance of current diet    ADL's   Min A oveall  Mod I   NMR, activity tolerance, balance strategies, pt/fmaily education, modified bathing/dressing   Mobility   min A overall for transfers and gait  mod I overall  standing balance, gait, safety awareness   Communication             Safety/Cognition/ Behavioral Observations  Supervision-Min A  Mod I  problem solving, recall, attention    Pain   no c/o pain  no pain  assess pain q shift and prn   Skin   generalized bruising  no new skin issues  Assess skin q shift and prn      *See Care Plan and progress notes for long and short-term goals.     Barriers to Discharge  Current Status/Progress Possible Resolutions Date Resolved   Physician    Inaccessible home environment     Progressing towards goals         Nursing                  PT                    OT                  SLP  SW                Discharge Planning/Teaching Needs:  Home with wife who can provide care at discharge. Pt doing well and progressing quickly      Team Discussion:  Goals mod/i, currently min level. MBS today and passed upgraded to thin liquids. Working on Surveyor, mining in therapies. Wife is here to observe today. Medically doing well monitoring his urgency. Making good gains in therapies quickly  Revisions to Treatment Plan:  DC 10/31    Continued Need for Acute Rehabilitation Level of Care: The patient requires daily medical management by a physician with specialized training in physical medicine and rehabilitation for the following conditions: Daily direction of a multidisciplinary physical rehabilitation program to ensure safe treatment while eliciting the highest outcome that is of practical value to the patient.: Yes Daily medical management of patient stability for increased activity during  participation in an intensive rehabilitation regime.: Yes Daily analysis of laboratory values and/or radiology reports with any subsequent need for medication adjustment of medical intervention for : Neurological problems  Nikiya Starn, Gardiner Rhyme 10/31/2016, 1:19 PM

## 2016-10-31 NOTE — Significant Event (Signed)
Wife requested medication not be given to patient d/t medication causing extreme drowsiness and he is no longer experiencing hiccups.  Mitzie Na, SN

## 2016-10-31 NOTE — Progress Notes (Signed)
Occupational Therapy Session Note  Patient Details  Name: [name] MRN: [id number] Date of Birth: [birth year]  [name] Date: 10/31/2016 OT Individual Time: 1131-1201 OT Individual Time Calculation (min): 30 min   Short Term Goals: Week 1:  OT Short Term Goal 1 (Week 1): STG=LTG 2/2 ELOS  Skilled Therapeutic Interventions/Progress Updates:    Pt greeted handoff from PT. Required short rest break prior to ambulating to therapy apartment with min guard A and verbal cues for increased step length. Discussed home bathroom set-up with pt and spouse. Simulated walk-in shower transfer using shower chair and simulated 4 inch threshold. Demonstrated safe techniques for stepping into shower, then pt demonstrated understanding with steady assist for balance. Addressed attention and multitasking with functional ambulation and scavenger hunt-game. Pt needed increased assistance to maintain balance and coordination with steps while scanning for items in hallway with little to no awareness to self-correct. Pt returned to room and left seated in recliner with chair alarm on and needs met.   Therapy Documentation Precautions:  Precautions Precautions: Fall Restrictions Weight Bearing Restrictions: No Pain: Pain Assessment Pain Assessment: 0-10 Pain Score: 0-No pain ADL: ADL ADL Comments: Please see functional navigator  See Function Navigator for Current Functional Status.   Therapy/Group: Individual Therapy  Valma Cava 10/31/2016, 12:20 PM

## 2016-10-31 NOTE — Progress Notes (Signed)
Physical Therapy Session Note  Patient Details  Name: [name] MRN: [id number] Date of Birth: 1935/09/10  [name] Date: 10/31/2016 PT Individual Time: 1100-1130 PT Individual Time Calculation (min): 30 min   Short Term Goals: Week 1:  PT Short Term Goal 1 (Week 1): =LTGs due to LOS  Skilled Therapeutic Interventions/Progress Updates:    Pt in bed upon arrival, agreeable to PT session. Gait: pt ambulating 160 ft without AD and CGA for stability (multiple shorter distances performed). Cues for stride length and posture - mild instability noted throughout. Pt performing standing balance activities including UE activity in standing with progression to 6inch step toe taps, floor targets diagonal, forward and across midline. Following session pt sitting in recliner with chair alarm in place. OT arriving prior to setting chair alarm.    Therapy Documentation Precautions:  Precautions Precautions: Fall Restrictions Weight Bearing Restrictions: No Pain: Pain Assessment Pain Assessment: 0-10 Pain Score: 0-No pain   See Function Navigator for Current Functional Status.   Therapy/Group: Individual Therapy  Linard Millers, PT 10/31/2016, 11:44 AM

## 2016-10-31 NOTE — Progress Notes (Signed)
Progress Note  Patient Name: [name] Date of Encounter: 10/31/2016  Primary Cardiologist: Marlou Porch  Subjective   Feels great, able to participate in intense rehabilitation without complaints of dizziness, fatigue or near syncope. And trachea rate remains in the high 50s, low 60s, atrial fibrillation. And aware of palpitations. Has not yet been started on anticoagulation. No new neurological complaints. Improved swallowing.  Inpatient Medications    Scheduled Meds: . amLODipine  10 mg Oral Daily  . atenolol  25 mg Oral Daily  . baclofen  10 mg Oral TID  . finasteride  5 mg Oral Daily  . insulin aspart  0-9 Units Subcutaneous TID AC & HS  . tamsulosin  0.8 mg Oral QPC breakfast   Continuous Infusions:  PRN Meds: acetaminophen, bisacodyl, ondansetron **OR** ondansetron (ZOFRAN) IV, senna-docusate, sorbitol   Vital Signs    Vitals:   10/30/16 0339 10/30/16 1421 10/31/16 0325  BP: (!) 141/86 130/75 126/79  Pulse: 60 (!) 57 62  Resp: 18 18 16   Temp: 99 F (37.2 C) 98.4 F (36.9 C) 98.9 F (37.2 C)  TempSrc: Oral Oral Oral  SpO2: 98% 98% 99%  Weight: 182 lb 15.7 oz (83 kg)  183 lb 8.4 oz (83.2 kg)    Intake/Output Summary (Last 24 hours) at 10/31/16 1421 Last data filed at 10/31/16 0900  Gross per 24 hour  Intake              460 ml  Output                0 ml  Net              460 ml   Filed Weights   10/30/16 0339 10/31/16 0325  Weight: 182 lb 15.7 oz (83 kg) 183 lb 8.4 oz (83.2 kg)    ECG    Repeat ECG shows atrial fibrillation with slow ventricular response. QTC 490 ms is probably upper limit of normal for this heart rate - Personally Reviewed  Physical Exam  Smiling, comfortable GEN: No acute distress.   Neck: No JVD Cardiac:  irregular, no murmurs, rubs, or gallops.  Respiratory: Clear to auscultation bilaterally. GI: Soft, nontender, non-distended  MS: No edema; No deformity. Neuro:   mild weakness and apraxia left upper extremity and  lower extremity Psych: Normal affect   Labs    Chemistry Recent Labs Lab 10/28/16 0240 10/29/16 0216 10/30/16 0536  NA 138 138 140  K 3.4* 3.9 3.7  CL 110 110 108  CO2 19* 20* 26  GLUCOSE 93 103* 103*  BUN 9 8 8   CREATININE 0.67 0.68 0.73  CALCIUM 8.3* 8.6* 8.8*  PROT  --   --  6.3*  ALBUMIN  --   --  3.4*  AST  --   --  27  ALT  --   --  16*  ALKPHOS  --   --  55  BILITOT  --   --  1.1  GFRNONAA >60 >60 >60  GFRAA >60 >60 >60  ANIONGAP 9 8 6      Hematology Recent Labs Lab 10/28/16 0240 10/29/16 0216 10/30/16 0536  WBC 7.9 6.6 5.9  RBC 3.26* 3.48* 3.53*  HGB 10.6* 11.3* 11.2*  HCT 31.1* 33.1* 33.6*  MCV 95.4 95.1 95.2  MCH 32.5 32.5 31.7  MCHC 34.1 34.1 33.3  RDW 13.5 13.6 13.5  PLT 97* 111* 127*    Cardiac Enzymes Recent Labs Lab 10/24/16 2024 10/25/16 0135  TROPONINI <0.03 <  0.03   Cardiac Studies   ECHO 10/27/2016 - Left ventricle: The cavity size was normal. Wall thickness wasincreased in a pattern of moderate LVH. There was mild focalbasal hypertrophy of the septum. Systolic function was normal.The estimated ejection fraction was in the range of 55% to 60%.Wall motion was normal; there were no regional wall motionabnormalities. - Aortic valve: A bioprosthesis was present. Valve area (VTI): 1.62cm^2. Valve area (Vmax): 1.34 cm^2. Valve area (Vmean): 1.39cm^2. - Aortic root: The aortic root was mildly dilated. - Mitral valve: There was moderate regurgitation. - Left atrium: The atrium was severely dilated. - Tricuspid valve: There was severe regurgitation. - Pulmonary arteries: Systolic pressure was moderately increased. PA peak pressure: 51 mm Hg (S). - Line: A venous catheter was visualized in the right atrium.  Impressions:  - Normal LV systolic function; moderate LVH; s/p AVR with mean gradient 14 mmHg; mildly dilated aortic root; moderate MR; severe LAE; severe TR with moderately elevated.  Myoview Study Highlights  04/2016    Nuclear stress EF: 60%.  Blood pressure demonstrated a normal response to exercise.  Horizontal ST segment depression ST segment depression of 2 mm was noted during stress in the II, III, aVF, V6, V5 and V4 leads.  This is a low risk study.  ST segment depression of 2 mm was noted during stress in the II, III, aVF, V6, V5 and V4 leads.  The test was stopped because the patient complained of fatigue and shortness of breath  Overall, the patient's exercise capacity was moderately impaired  [facility] Treadmill Score: intermediate risk  No significant reversible ischemia. LVEF 60% with normal wall motion. 2 mm inferior and lateral scooped ST-segment depression with exercise, may be repolarization abnormality. Exercise capacity was moderately reduced. Overall, however, this is a low risk study.     Patient Profile     81 y.o. 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  Assessment & Plan    1. Atrial fibrillation: newly diagnosed, asymptomatic, probable cause of embolic stroke. Well rate controlled very low-dose atenolol, without symptoms of bradycardia This patients CHA2DS2-VASc Score and unadjusted Ischemic Stroke Rate (% per year) is equal to 9.7 % stroke rate/year from a score of 6(HTN, DM, stroke, age). Notes from October 22 included intention to start full anticoagulation with Eliquis at that time. I ordered it today. 2. Long QT: Improved 3. HTN: BP in desirable range 4. S/P AVR: normal prosthetic valve function by current echo. Also had PFO repair at the time. 5. L hemiparesis and swallowing problems after CVA: Good progress with rehabilitation. Patient reports he may be going on Friday.  For questions or updates, please contact [place] Please consult [email or web address] for contact info under Cardiology/STEMI.      Signed, Sanda Klein, MD   10/31/2016, 2:21 PM

## 2016-10-31 NOTE — Progress Notes (Signed)
Social Work Patient ID: [name], male   DOB: 1935-05-13, 81 y.o.   MRN: [id number]  Met with pt and wife to discuss team conference goals mod/i and target discharge date 10/31. Both are pleased with how well he is progressing just passed his swallow today with speech. Wife here to observe in therapies. Will work on discharge needs for next Wed.

## 2016-10-31 NOTE — Progress Notes (Signed)
Speech Language Pathology Daily Session Note  Patient Details  Name: [name] MRN: [id number] Date of Birth: 10/15/1935  [name] Date: 10/31/2016 SLP Individual Time: 1300-1345 SLP Individual Time Calculation (min): 45 min  Short Term Goals: Week 1: SLP Short Term Goal 1 (Week 1): STGs=LTGs  Skilled Therapeutic Interventions: Skilled treatment session focused on dysphagia goals. SLP facilitated session by providing Min-Mod A verbal cues for use of swallowing compensatory strategies during lunch meal of Dys. 3 textures with thin liquids. Patient consumed meal with intermittent throat clear X 2. Recommend patient continue current diet with full supervision to maximize utilization of swallowing compensatory strategies. Patient's wife present and educated on patient's current swallowing function and goals of skilled SLP intervention. Patient and wife verbalized understanding and agreement. Patient left upright in recliner with all needs within reach and wife present. Continue with current plan of care.      Function:  Eating Eating   Modified Consistency Diet: Yes Eating Assist Level: Supervision or verbal cues;More than reasonable amount of time;Set up assist for   Eating Set Up Assist For: Opening containers       Cognition Comprehension Comprehension assist level: Follows basic conversation/direction with extra time/assistive device  Expression   Expression assist level: Expresses basic 90% of the time/requires cueing < 10% of the time.  Social Interaction Social Interaction assist level: Interacts appropriately with others with medication or extra time (anti-anxiety, antidepressant).  Problem Solving Problem solving assist level: Solves basic 90% of the time/requires cueing < 10% of the time  Memory Memory assist level: Recognizes or recalls 75 - 89% of the time/requires cueing 10 - 24% of the time    Pain No/Denies Pain   Therapy/Group: Individual Therapy  Harshita Bernales,  [name] 10/31/2016, 3:12 PM

## 2016-10-31 NOTE — Progress Notes (Signed)
Patient ready for transport to Rehab; report called to RN; patient transferred via wheelchair. Accompanied by his wife.

## 2016-10-31 NOTE — Progress Notes (Signed)
Modified Barium Swallow Progress Note  Patient Details  Name: VIRAJ LIBY MRN: [id number] Date of Birth: 06/06/1935  [name] Date: 10/31/2016  Modified Barium Swallow completed.  Full report located under Chart Review in the Imaging Section.  Brief recommendations include the following:  Clinical Impression  Patient demonstrates a mild pharyngeal motor based dysphagia characterized by decreased base of tongue retraction, decreased epiglottic inversion and decreased pharyngeal constriction resulting in mild-moderate vallecular and pyriform sinus residue. Patient also demonstrated intermittent trace penetration that was cleared with a spontaneous throat clear. Residue and penetration episodes were reduced with a head turn to the left. Suspect patient had some level of baseline dysphagia due to what appeared to be prominent CP that is now further exacerbated by deconditioning from recent CVA. Recommend Dys. 3 textures with thin liquids with use of ahead turn to the left with both liquids and solids. Patient verbalized understanding and agreement.    Swallow Evaluation Recommendations       SLP Diet Recommendations: Dysphagia 3 (Mech soft) solids;Thin liquid   Liquid Administration via: Cup;No straw   Medication Administration: Whole meds with puree   Supervision: Patient able to self feed   Compensations: Slow rate;Small sips/bites (head turn to left )   Postural Changes: Seated upright at 90 degrees;Remain semi-upright after after feeds/meals (Comment)   Oral Care Recommendations: Oral care BID       Weston Anna, MA, CCC-SLP [phone]   [place] 10/31/2016,11:29 AM

## 2016-10-31 NOTE — Progress Notes (Signed)
Subjective/Complaints: No issues overnite, discussed  ROS - no CP, No SOB, cont bladder and bowel  Objective: Vital Signs: Blood pressure 126/79, pulse 62, temperature 98.9 F (37.2 C), temperature source Oral, resp. rate 16, weight 83.2 kg (183 lb 8.4 oz), SpO2 99 %. No results found. Results for orders placed or performed during the hospital encounter of 10/29/16 (from the past 72 hour(s))  Glucose, capillary     Status: Abnormal   Collection Time: 10/29/16  8:56 PM  Result Value Ref Range   Glucose-Capillary 101 (H) 65 - 99 mg/dL   Comment 1 Notify RN   CBC WITH DIFFERENTIAL     Status: Abnormal   Collection Time: 10/30/16  5:36 AM  Result Value Ref Range   WBC 5.9 4.0 - 10.5 K/uL   RBC 3.53 (L) 4.22 - 5.81 MIL/uL   Hemoglobin 11.2 (L) 13.0 - 17.0 g/dL   HCT 33.6 (L) 39.0 - 52.0 %   MCV 95.2 78.0 - 100.0 fL   MCH 31.7 26.0 - 34.0 pg   MCHC 33.3 30.0 - 36.0 g/dL   RDW 13.5 11.5 - 15.5 %   Platelets 127 (L) 150 - 400 K/uL   Neutrophils Relative % 52 %   Neutro Abs 3.0 1.7 - 7.7 K/uL   Lymphocytes Relative 29 %   Lymphs Abs 1.7 0.7 - 4.0 K/uL   Monocytes Relative 14 %   Monocytes Absolute 0.8 0.1 - 1.0 K/uL   Eosinophils Relative 4 %   Eosinophils Absolute 0.3 0.0 - 0.7 K/uL   Basophils Relative 1 %   Basophils Absolute 0.0 0.0 - 0.1 K/uL  Comprehensive metabolic panel     Status: Abnormal   Collection Time: 10/30/16  5:36 AM  Result Value Ref Range   Sodium 140 135 - 145 mmol/L   Potassium 3.7 3.5 - 5.1 mmol/L   Chloride 108 101 - 111 mmol/L   CO2 26 22 - 32 mmol/L   Glucose, Bld 103 (H) 65 - 99 mg/dL   BUN 8 6 - 20 mg/dL   Creatinine, Ser 0.73 0.61 - 1.24 mg/dL   Calcium 8.8 (L) 8.9 - 10.3 mg/dL   Total Protein 6.3 (L) 6.5 - 8.1 g/dL   Albumin 3.4 (L) 3.5 - 5.0 g/dL   AST 27 15 - 41 U/L   ALT 16 (L) 17 - 63 U/L   Alkaline Phosphatase 55 38 - 126 U/L   Total Bilirubin 1.1 0.3 - 1.2 mg/dL   GFR calc non Af Amer >60 >60 mL/min   GFR calc Af Amer >60 >60 mL/min     Comment: (NOTE) The eGFR has been calculated using the CKD EPI equation. This calculation has not been validated in all clinical situations. eGFR's persistently <60 mL/min signify possible Chronic Kidney Disease.    Anion gap 6 5 - 15  Glucose, capillary     Status: Abnormal   Collection Time: 10/30/16  6:22 AM  Result Value Ref Range   Glucose-Capillary 102 (H) 65 - 99 mg/dL  Glucose, capillary     Status: None   Collection Time: 10/30/16 12:09 PM  Result Value Ref Range   Glucose-Capillary 99 65 - 99 mg/dL  Glucose, capillary     Status: Abnormal   Collection Time: 10/30/16  4:28 PM  Result Value Ref Range   Glucose-Capillary 102 (H) 65 - 99 mg/dL  Glucose, capillary     Status: Abnormal   Collection Time: 10/30/16  8:50 PM  Result Value Ref Range  Glucose-Capillary 113 (H) 65 - 99 mg/dL  Glucose, capillary     Status: Abnormal   Collection Time: 10/31/16  6:35 AM  Result Value Ref Range   Glucose-Capillary 100 (H) 65 - 99 mg/dL     HEENT: normal Cardio: IRRR and no murmur Resp: CTA B/L and unlabored GI: Soft NT Extremity:  Pulses positive and No Edema Skin:   Intact and Wound Right groin cath site CDI Neuro: Alert/Oriented and Abnormal Sensory reduced sensation Left foot Musc/Skel:  Other no pain with UE and LE ROM Gen NAD   Assessment/Plan: 1. Functional deficits secondary to RIght MCA infarct which require 3+ hours per day of interdisciplinary therapy in a comprehensive inpatient rehab setting. Physiatrist is providing close team supervision and 24 hour management of active medical problems listed below. Physiatrist and rehab team continue to assess barriers to discharge/monitor patient progress toward functional and medical goals. FIM: Function - Bathing Position: Shower Body parts bathed by patient: Right arm, Left arm, Abdomen, Chest, Front perineal area, Buttocks, Left upper leg, Right upper leg, Right lower leg, Left lower leg Assist Level: Touching or  steadying assistance(Pt > 75%)  Function- Upper Body Dressing/Undressing What is the patient wearing?: Pull over shirt/dress  Function - Toileting Toileting steps completed by patient: Adjust clothing prior to toileting, Performs perineal hygiene, Adjust clothing after toileting  Function - Toilet Transfers Toilet transfer assistive device: Grab bar Assist level to toilet: Touching or steadying assistance (Pt > 75%) Assist level from toilet: Touching or steadying assistance (Pt > 75%)  Function - Chair/bed transfer Chair/bed transfer method: Ambulatory Chair/bed transfer assist level: Touching or steadying assistance (Pt > 75%) Chair/bed transfer assistive device: Armrests  Function - Locomotion: Wheelchair Will patient use wheelchair at discharge?: No Function - Locomotion: Ambulation Assistive device: No device, Hand held assist Max distance: 22' Assist level: Touching or steadying assistance (Pt > 75%) Assist level: Touching or steadying assistance (Pt > 75%) Assist level: Touching or steadying assistance (Pt > 75%) Assist level: Touching or steadying assistance (Pt > 75%) Assist level: Touching or steadying assistance (Pt > 75%)  Function - Comprehension Comprehension: Auditory Comprehension assist level: Follows basic conversation/direction with extra time/assistive device  Function - Expression Expression: Verbal Expression assist level: Expresses basic 90% of the time/requires cueing < 10% of the time.  Function - Social Interaction Social Interaction assist level: Interacts appropriately with others with medication or extra time (anti-anxiety, antidepressant).  Function - Problem Solving Problem solving assist level: Solves basic 90% of the time/requires cueing < 10% of the time  Function - Memory Memory assist level: Recognizes or recalls 75 - 89% of the time/requires cueing 10 - 24% of the time Medical Problem List and Plan: 1.  Left hemiparesis and dysphagia  secondary to right MCA infarction Team conference today please see physician documentation under team conference tab, met with team face-to-face to discuss problems,progress, and goals. Formulized individual treatment plan based on medical history, underlying problem and comorbidities. 2.  DVT Prophylaxis/Anticoagulation: Eliquis 3. Pain Management: Baclofen 5 mg 3 times a day 4. Mood: Provide emotional support 5. Neuropsych: This patient is capable of making decisions on his own behalf. 6. Skin/Wound Care: Routine skin checks 7. Fluids/Electrolytes/Nutrition: Routine I&O with follow-up chemistries, BMET nl 10/23 8. Atrial fibrillation/status post aortic valve replacement. Cardiology service follow-up. Atenolol reduced to 37.5 mg due to bouts of bradycardia. Vitals:   10/30/16 1421 10/31/16 0325  BP: 130/75 126/79  Pulse: (!) 57 62  Resp: 18 16  Temp: 98.4 F (36.9 C) 98.9 F (37.2 C)  SpO2: 98% 99%  HR in acceptable range 9. Dysphagia. Dysphagia #3 her liquids. Follow-up speech therapy 10. Hypertension. Norvasc 10 mg daily 11. Hypokalemia. Follow-up chemistries 12. Borderline diabetes mellitus. Hemoglobin A1c 5.6. Patient diet controlled prior to admission. Continue sliding scale for now CBG (last 3)   Recent Labs  10/30/16 1628 10/30/16 2050 10/31/16 0635  GLUCAP 102* 113* 100*  Controlled may reduce to daily 13. BPH. Proscar 5 mg daily, Flomax 0.8 mg daily. Check PVR 3  LOS (Days) 2 A FACE TO FACE EVALUATION WAS PERFORMED  Lanisa Ishler E 10/31/2016, 7:44 AM

## 2016-10-31 NOTE — Progress Notes (Signed)
Physical Therapy Session Note  Patient Details  Name: [name] MRN: [id number] Date of Birth: 1935-05-01  [name] Date: 10/31/2016 PT Individual Time: 1500-1600 PT Individual Time Calculation (min): 60 min   Short Term Goals: Week 1:  PT Short Term Goal 1 (Week 1): =LTGs due to LOS  Skilled Therapeutic Interventions/Progress Updates:    no c/o pain throughout session; session focused on standing balance and functional mobility.   Pt performed ambulatory transfers with min A throughout session with verbal cues for safety. Pt expressed wanting to change underwear prior to therapy, so pt amb 10' to bathroom and used grab bars for balance to pull down pants and underwear with min A for steadying. Pt sat on toilet to thread LEs into underwear and pants and performed sit > stand with min A for steadying and adjusted LE clothing with min A for steadying. Pt amb 100' down to gym with min A for steadying and clothing management. Pt performed standing toe tapping to dots with min A for steadying x 3 rounds to 4 dots with alternating LEs, requiring slightly more assist when lifting the RLE and having to bear weight through the LLE. Pt performed standing with narrow BOS in parallel bars with min guard with eyes closed for 30 sec with minimal swaying but pt had hands hovering over parallel bars for comfort. Pt performed tandem stance with RLE in back for 1 min with min guard and no UE support, tandem stance with LLE in back for 1 min with min A due to more unsteadiness but without UE support as well. Pt performed standing on foam pad without UE support for 30 seconds with min guard, heel raises x 10 with min A and single UE support, toe raises x 10 with min A and single UE support. Pt required rest breaks throughout session secondary to fatigue. Pt amb 500' x 2 with rest break in between with min A for steadying and verbal cues to pick up L foot and remember to hit with heel first on the L. Pt left supine  in bed, call bell within reach, all needs addressed.   Therapy Documentation Precautions:  Precautions Precautions: Fall Restrictions Weight Bearing Restrictions: No  See Function Navigator for Current Functional Status.   Therapy/Group: Individual Therapy  Harriet Masson 10/31/2016, 5:00 PM

## 2016-11-01 ENCOUNTER — Inpatient Hospital Stay (HOSPITAL_COMMUNITY): Payer: Medicare Other | Admitting: Occupational Therapy

## 2016-11-01 ENCOUNTER — Inpatient Hospital Stay (HOSPITAL_COMMUNITY): Payer: Self-pay | Admitting: Physical Therapy

## 2016-11-01 ENCOUNTER — Inpatient Hospital Stay (HOSPITAL_COMMUNITY): Payer: Medicare Other | Admitting: Speech Pathology

## 2016-11-01 ENCOUNTER — Inpatient Hospital Stay (HOSPITAL_COMMUNITY): Payer: Medicare Other | Admitting: Physical Therapy

## 2016-11-01 DIAGNOSIS — I4821 Permanent atrial fibrillation: Secondary | ICD-10-CM

## 2016-11-01 DIAGNOSIS — I69391 Dysphagia following cerebral infarction: Secondary | ICD-10-CM

## 2016-11-01 DIAGNOSIS — I480 Paroxysmal atrial fibrillation: Secondary | ICD-10-CM

## 2016-11-01 DIAGNOSIS — I4891 Unspecified atrial fibrillation: Secondary | ICD-10-CM

## 2016-11-01 DIAGNOSIS — I10 Essential (primary) hypertension: Secondary | ICD-10-CM

## 2016-11-01 DIAGNOSIS — R739 Hyperglycemia, unspecified: Secondary | ICD-10-CM

## 2016-11-01 DIAGNOSIS — I4819 Other persistent atrial fibrillation: Secondary | ICD-10-CM

## 2016-11-01 LAB — GLUCOSE, CAPILLARY
Glucose-Capillary: 94 mg/dL (ref 65–99)
Glucose-Capillary: 109 mg/dL — ABNORMAL HIGH (ref 65–99)
Glucose-Capillary: 115 mg/dL — ABNORMAL HIGH (ref 65–99)
Glucose-Capillary: 102 mg/dL — ABNORMAL HIGH (ref 65–99)

## 2016-11-01 NOTE — Progress Notes (Signed)
Physical Therapy Session Note  Patient Details  Name: [name] MRN: [id number] Date of Birth: 09-Dec-1935  [name] Date: 11/01/2016 PT Individual Time: 1100-1158 PT Individual Time Calculation (min): 58 min   Short Term Goals: Week 1:  PT Short Term Goal 1 (Week 1): =LTGs due to LOS  Skilled Therapeutic Interventions/Progress Updates:    no c/o pain throughout session; session focused on standing balance and functional mobility.   Pt performed sit > stand throughout session with supervision for safety. Pt participated in [place], see below for scoring. Pt performed standing toe taps onto step x 10 with L, x 10 with R, then x 20 alternating with min A for steadying. Pt performed standing on foam pad in parallel bars with min A for balance without UE support. Pt performed mini squats with finger tips resting on parallel bars with min A for steadying. Pt required rest breaks between activities secondary to fatigue. Pt amb 150' x 2 to and from room with min A for steadying and clothing management. Pt left upright in recliner, chair alarm on, wife present, all needs addressed.   Therapy Documentation Precautions:  Precautions Precautions: Fall Restrictions Weight Bearing Restrictions: No    Balance: Standardized Balance Assessment Standardized Balance Assessment: Berg Balance Test Berg Balance Test Sit to Stand: Able to stand without using hands and stabilize independently Standing Unsupported: Able to stand 2 minutes with supervision Sitting with Back Unsupported but Feet Supported on Floor or Stool: Able to sit safely and securely 2 minutes Stand to Sit: Sits safely with minimal use of hands Transfers: Able to transfer safely, minor use of hands Standing Unsupported with Eyes Closed: Able to stand 10 seconds with supervision Standing Ubsupported with Feet Together: Able to place feet together independently and stand for 1 minute with supervision From Standing, Reach  Forward with Outstretched Arm: Can reach confidently >25 cm (10") From Standing Position, Pick up Object from Floor: Able to pick up shoe, needs supervision From Standing Position, Turn to Look Behind Over each Shoulder: Looks behind from both sides and weight shifts well Turn 360 Degrees: Able to turn 360 degrees safely but slowly Standing Unsupported, Alternately Place Feet on Step/Stool: Able to stand independently and complete 8 steps >20 seconds Standing Unsupported, One Foot in Front: Able to take small step independently and hold 30 seconds Standing on One Leg: Able to lift leg independently and hold equal to or more than 3 seconds Total Score: 45   See Function Navigator for Current Functional Status.   Therapy/Group: Individual Therapy  Harriet Masson 11/01/2016, 12:53 PM

## 2016-11-01 NOTE — Progress Notes (Signed)
Subjective/Complaints: Patient seen lying in bed this morning. He states he slept well overnight. He states he wants to get up without assistance, but currently understands that he needs to call for help.  ROS - denies CP, SOB, nausea, vomiting, diarrhea.  Objective: Vital Signs: Blood pressure 121/82, pulse 60, temperature 97.9 F (36.6 C), temperature source Oral, resp. rate 18, weight 81.2 kg (179 lb), SpO2 99 %. Dg Swallowing Func-speech Pathology  Result Date: 10/31/2016 Objective Swallowing Evaluation: Type of Study: MBS-Modified Barium Swallow Study Patient Details Name: [name] MRN: [id number] Date of Birth: 11/13/1935 [name] Date: 10/31/2016 Time: SLP Start Time (ACUTE ONLY): 0905-SLP Stop Time (ACUTE ONLY): 0930 SLP Time Calculation (min) (ACUTE ONLY): 25 min Past Medical History: Past Medical History: Diagnosis Date . Bladder stones  . Borderline diabetes  . BPH (benign prostatic hyperplasia)  . Coronary artery disease   cardiologist-  dr Nat Math gerhart NP--- per cath 06-02-2010 non-obstructive cad pLAD 30-40% . Diverticulosis of colon  . DOE (dyspnea on exertion)  . Gout  . History of adenomatous polyp of colon   2002-- tubular adenoma . History of aortic insufficiency   severe -- s/p  AVR 08-03-2010 . History of basal cell carcinoma (BCC) excision   10/ 2015  left ear . History of small bowel obstruction   02/ 2007 mechanical sbo s/p  surgical intervention;  partial sbo 09/ 2011 and 03-20-2011 resolved without surgical intervention . History of squamous cell carcinoma excision   2014 right foot . History of urinary retention  . HTN (hypertension)  . Peripheral neuropathy  . S/P aortic valve replacement with prosthetic valve 08/03/2010  tissue valve . S/P patent foramen ovale closure 08/03/2010  at same time AVR Past Surgical History: Past Surgical History: Procedure Laterality Date . CARDIAC CATHETERIZATION  06-02-2010  dr Marlou Porch  non-obstructive cad- pLAD 30-40%/  normal  LVSF/  severe AI . CARDIOVASCULAR STRESS TEST  04/12/2016  Low risk nuclear perfusion study w/ no significant reversible ischemia/  normal LV function and wall motion ,  stress ef 60%/  29m inferior and lateral scooped ST-segment depression w/ exercise (may be repolarization abnormality), exercise capacity was moderately reduced . CATARACT EXTRACTION W/ INTRAOCULAR LENS  IMPLANT, BILATERAL  02/2010 . CYSTOSCOPY WITH LITHOLAPAXY N/A 06/05/2016  Procedure: CYSTOSCOPY WITH LITHOLAPAXY and fulgarization of bladder neck;  Surgeon: WIrine Seal MD;  [facility]  Service: Urology;  Laterality: N/A; . EXPLORATORY LAPARTOMY /  CHOLECYSTECTOMY  02/28/2005  for Small  bowel obstruction (mechnical) . IR ANGIO EXTRACRAN SEL COM CAROTID INNOMINATE UNI L MOD SED  10/24/2016 . IR ANGIO VERTEBRAL SEL SUBCLAVIAN INNOMINATE BILAT MOD SED  10/24/2016 . IR PERCUTANEOUS ART THROMBECTOMY/INFUSION INTRACRANIAL INC DIAG ANGIO  10/24/2016 . LEFT KNEE ARTHROSCOPY  2006 . RADIOLOGY WITH ANESTHESIA N/A 10/24/2016  Procedure: RADIOLOGY WITH ANESTHESIA;  Surgeon: DLuanne Bras MD;  [facility]  Service: Radiology;  Laterality: N/A; . RIGHT FOOT SURGERY   . RIGHT MINIATURE ANTERIOR THORACOTOMY FOR AORTIC VALVE REPLACEMENT AND CLOSURE PATENT FORAMEN OVALE  08-03-2010  DR OLevada SchillingMagna-ease pericardial tissue valve (224m . TRANSTHORACIC ECHOCARDIOGRAM  05/30/2016  dr skains  moderate  LVH ef 60-65%/  bioprothesis aortic valve present ,normal grandient and no AI /  mild MV calcification , moderate MR /  mild PR/ moderate TR/  PASP 3573m/ (RA denisty was identified 04-27-2016 echo) and is seen again today, this is likely a promient eustacian ridge, atrium is normal size HPI: See H&P  Subjective: pleasant Assessment / Plan / Recommendation CHL IP CLINICAL IMPRESSIONS 10/31/2016 Clinical Impression Patient demonstrates a mild pharyngeal motor based dysphagia characterized by decreased base of tongue retraction,  decreased epiglottic inversion and decreased pharyngeal constriction resulting in mild-moderate vallecular and pyriform sinus residue. Patient also demonstrated intermittent trace penetration that was cleared with a spontaneous throat clear. Residue and penetration episodes were reduced with a head turn to the left. Suspect patient had some level of baseline dysphagia due to what appeared to be prominent CP that is now further exacerbated by deconditioning from recent CVA. Recommend Dys. 3 textures with thin liquids with use of ahead turn to the left with both liquids and solids. Patient verbalized understanding and agreement.  SLP Visit Diagnosis Dysphagia, pharyngoesophageal phase (R13.14) Attention and concentration deficit following -- Frontal lobe and executive function deficit following -- Impact on safety and function Mild aspiration risk   CHL IP TREATMENT RECOMMENDATION 10/31/2016 Treatment Recommendations Therapy as outlined in treatment plan below   Prognosis 10/31/2016 Prognosis for Safe Diet Advancement Good Barriers to Reach Goals -- Barriers/Prognosis Comment -- CHL IP DIET RECOMMENDATION 10/31/2016 SLP Diet Recommendations Dysphagia 3 (Mech soft) solids;Thin liquid Liquid Administration via Cup;No straw Medication Administration Whole meds with puree Compensations Slow rate;Small sips/bites Postural Changes Seated upright at 90 degrees;Remain semi-upright after after feeds/meals (Comment)   CHL IP OTHER RECOMMENDATIONS 10/31/2016 Recommended Consults -- Oral Care Recommendations Oral care BID Other Recommendations --   CHL IP FOLLOW UP RECOMMENDATIONS 10/31/2016 Follow up Recommendations (No Data)   CHL IP FREQUENCY AND DURATION 10/31/2016 Speech Therapy Frequency (ACUTE ONLY) min 3x week Treatment Duration 1 week      CHL IP ORAL PHASE 10/31/2016 Oral Phase Impaired Oral - Pudding Teaspoon -- Oral - Pudding Cup -- Oral - Honey Teaspoon -- Oral - Honey Cup -- Oral - Nectar Teaspoon -- Oral - Nectar  Cup -- Oral - Nectar Straw -- Oral - Thin Teaspoon WFL Oral - Thin Cup WFL Oral - Thin Straw -- Oral - Puree WFL Oral - Mech Soft Impaired mastication Oral - Regular -- Oral - Multi-Consistency -- Oral - Pill -- Oral Phase - Comment --  CHL IP PHARYNGEAL PHASE 10/31/2016 Pharyngeal Phase Impaired Pharyngeal- Pudding Teaspoon -- Pharyngeal -- Pharyngeal- Pudding Cup -- Pharyngeal -- Pharyngeal- Honey Teaspoon -- Pharyngeal -- Pharyngeal- Honey Cup -- Pharyngeal -- Pharyngeal- Nectar Teaspoon -- Pharyngeal -- Pharyngeal- Nectar Cup -- Pharyngeal -- Pharyngeal- Nectar Straw -- Pharyngeal -- Pharyngeal- Thin Teaspoon Reduced epiglottic inversion;Pharyngeal residue - valleculae;Pharyngeal residue - pyriform Pharyngeal -- Pharyngeal- Thin Cup Reduced epiglottic inversion;Reduced airway/laryngeal closure;Penetration/Aspiration during swallow;Pharyngeal residue - pyriform;Pharyngeal residue - valleculae;Pharyngeal residue - cp segment;Compensatory strategies attempted (with notebox) Pharyngeal Material enters airway, CONTACTS cords and then ejected out;Material enters airway, remains ABOVE vocal cords and not ejected out Pharyngeal- Thin Straw -- Pharyngeal -- Pharyngeal- Puree Reduced epiglottic inversion;Pharyngeal residue - valleculae;Pharyngeal residue - cp segment;Reduced tongue base retraction Pharyngeal -- Pharyngeal- Mechanical Soft Reduced tongue base retraction;Pharyngeal residue - valleculae;Pharyngeal residue - pyriform;Pharyngeal residue - cp segment;Reduced epiglottic inversion Pharyngeal -- Pharyngeal- Regular -- Pharyngeal -- Pharyngeal- Multi-consistency -- Pharyngeal -- Pharyngeal- Pill -- Pharyngeal -- Pharyngeal Comment --  CHL IP CERVICAL ESOPHAGEAL PHASE 10/31/2016 Cervical Esophageal Phase Impaired Pudding Teaspoon -- Pudding Cup -- Honey Teaspoon -- Honey Cup -- Nectar Teaspoon -- Nectar Cup -- Nectar Straw -- Thin Teaspoon -- Thin Cup -- Thin Straw -- Puree -- Mechanical Soft -- Regular --  Multi-consistency -- Pill -- Cervical Esophageal Comment Patient appeared  to have a prominent CP, also with cervial osteophytes  No flowsheet data found. Weston Anna, MA, CCC-SLP [phone] [place], [place] 10/31/2016, 11:30 AM              Results for orders placed or performed during the hospital encounter of 10/29/16 (from the past 72 hour(s))  Glucose, capillary     Status: Abnormal   Collection Time: 10/29/16  8:56 PM  Result Value Ref Range   Glucose-Capillary 101 (H) 65 - 99 mg/dL   Comment 1 Notify RN   CBC WITH DIFFERENTIAL     Status: Abnormal   Collection Time: 10/30/16  5:36 AM  Result Value Ref Range   WBC 5.9 4.0 - 10.5 K/uL   RBC 3.53 (L) 4.22 - 5.81 MIL/uL   Hemoglobin 11.2 (L) 13.0 - 17.0 g/dL   HCT 33.6 (L) 39.0 - 52.0 %   MCV 95.2 78.0 - 100.0 fL   MCH 31.7 26.0 - 34.0 pg   MCHC 33.3 30.0 - 36.0 g/dL   RDW 13.5 11.5 - 15.5 %   Platelets 127 (L) 150 - 400 K/uL   Neutrophils Relative % 52 %   Neutro Abs 3.0 1.7 - 7.7 K/uL   Lymphocytes Relative 29 %   Lymphs Abs 1.7 0.7 - 4.0 K/uL   Monocytes Relative 14 %   Monocytes Absolute 0.8 0.1 - 1.0 K/uL   Eosinophils Relative 4 %   Eosinophils Absolute 0.3 0.0 - 0.7 K/uL   Basophils Relative 1 %   Basophils Absolute 0.0 0.0 - 0.1 K/uL  Comprehensive metabolic panel     Status: Abnormal   Collection Time: 10/30/16  5:36 AM  Result Value Ref Range   Sodium 140 135 - 145 mmol/L   Potassium 3.7 3.5 - 5.1 mmol/L   Chloride 108 101 - 111 mmol/L   CO2 26 22 - 32 mmol/L   Glucose, Bld 103 (H) 65 - 99 mg/dL   BUN 8 6 - 20 mg/dL   Creatinine, Ser 0.73 0.61 - 1.24 mg/dL   Calcium 8.8 (L) 8.9 - 10.3 mg/dL   Total Protein 6.3 (L) 6.5 - 8.1 g/dL   Albumin 3.4 (L) 3.5 - 5.0 g/dL   AST 27 15 - 41 U/L   ALT 16 (L) 17 - 63 U/L   Alkaline Phosphatase 55 38 - 126 U/L   Total Bilirubin 1.1 0.3 - 1.2 mg/dL   GFR calc non Af Amer >60 >60 mL/min   GFR calc Af Amer >60 >60 mL/min    Comment: (NOTE) The eGFR has been calculated using  the CKD EPI equation. This calculation has not been validated in all clinical situations. eGFR's persistently <60 mL/min signify possible Chronic Kidney Disease.    Anion gap 6 5 - 15  Glucose, capillary     Status: Abnormal   Collection Time: 10/30/16  6:22 AM  Result Value Ref Range   Glucose-Capillary 102 (H) 65 - 99 mg/dL  Glucose, capillary     Status: None   Collection Time: 10/30/16 12:09 PM  Result Value Ref Range   Glucose-Capillary 99 65 - 99 mg/dL  Glucose, capillary     Status: Abnormal   Collection Time: 10/30/16  4:28 PM  Result Value Ref Range   Glucose-Capillary 102 (H) 65 - 99 mg/dL  Glucose, capillary     Status: Abnormal   Collection Time: 10/30/16  8:50 PM  Result Value Ref Range   Glucose-Capillary 113 (H) 65 - 99 mg/dL  Glucose, capillary  Status: Abnormal   Collection Time: 10/31/16  6:35 AM  Result Value Ref Range   Glucose-Capillary 100 (H) 65 - 99 mg/dL  Glucose, capillary     Status: Abnormal   Collection Time: 10/31/16 11:42 AM  Result Value Ref Range   Glucose-Capillary 112 (H) 65 - 99 mg/dL  Glucose, capillary     Status: None   Collection Time: 10/31/16  4:49 PM  Result Value Ref Range   Glucose-Capillary 99 65 - 99 mg/dL  Glucose, capillary     Status: Abnormal   Collection Time: 10/31/16  8:37 PM  Result Value Ref Range   Glucose-Capillary 133 (H) 65 - 99 mg/dL  Glucose, capillary     Status: None   Collection Time: 11/01/16  6:45 AM  Result Value Ref Range   Glucose-Capillary 94 65 - 99 mg/dL     HEENT: Normocephalic. Atraumatic. Cardio: IRRR and no JVD Resp: CTA B/L and unlabored GI: Soft and ND Skin:   Intact and Wound Right groin cath site CDI Neuro: Alert/Oriented Motor: RUE/RLE: 5/5 proximal to distal LUE/LLE: 4+/5 proximal to distal  Musc/Skel:  No edema. No tenderness. Gen NAD. Vital signs reviewed.   Assessment/Plan: 1. Functional deficits secondary to RIght MCA infarct which require 3+ hours per day of  interdisciplinary therapy in a comprehensive inpatient rehab setting. Physiatrist is providing close team supervision and 24 hour management of active medical problems listed below. Physiatrist and rehab team continue to assess barriers to discharge/monitor patient progress toward functional and medical goals. FIM: Function - Bathing Position: Shower Body parts bathed by patient: Right arm, Left arm, Abdomen, Chest, Front perineal area, Buttocks, Left upper leg, Right upper leg, Right lower leg, Left lower leg Assist Level: Touching or steadying assistance(Pt > 75%)  Function- Upper Body Dressing/Undressing What is the patient wearing?: Pull over shirt/dress Pull over shirt/dress - Perfomed by patient: Thread/unthread right sleeve, Thread/unthread left sleeve, Put head through opening, Pull shirt over trunk Assist Level: Touching or steadying assistance(Pt > 75%) Function - Lower Body Dressing/Undressing What is the patient wearing?: Pants, Socks, Shoes Position: Sitting EOB Pants- Performed by patient: Thread/unthread left pants leg, Thread/unthread right pants leg Pants- Performed by helper: Pull pants up/down Socks - Performed by patient: Don/doff left sock, Don/doff right sock Shoes - Performed by patient: Don/doff right shoe, Don/doff left shoe Shoes - Performed by helper: Fasten right, Fasten left Assist for footwear: Supervision/touching assist Assist for lower body dressing: Touching or steadying assistance (Pt > 75%)  Function - Toileting Toileting steps completed by patient: Adjust clothing prior to toileting, Performs perineal hygiene, Adjust clothing after toileting Assist level: Touching or steadying assistance (Pt.75%)  Function - Toilet Transfers Toilet transfer assistive device: Grab bar Assist level to toilet: Touching or steadying assistance (Pt > 75%) Assist level from toilet: Touching or steadying assistance (Pt > 75%)  Function - Chair/bed transfer Chair/bed  transfer method: Ambulatory Chair/bed transfer assist level: Touching or steadying assistance (Pt > 75%) Chair/bed transfer assistive device: Armrests  Function - Locomotion: Wheelchair Will patient use wheelchair at discharge?: No Function - Locomotion: Ambulation Assistive device: No device Max distance: 500' Assist level: Touching or steadying assistance (Pt > 75%) Assist level: Touching or steadying assistance (Pt > 75%) Assist level: Touching or steadying assistance (Pt > 75%) Assist level: Touching or steadying assistance (Pt > 75%) Assist level: Touching or steadying assistance (Pt > 75%)  Function - Comprehension Comprehension: Auditory Comprehension assist level: Follows basic conversation/direction with extra time/assistive device  Function - Expression Expression: Verbal Expression assist level: Expresses basic 90% of the time/requires cueing < 10% of the time.  Function - Social Interaction Social Interaction assist level: Interacts appropriately with others with medication or extra time (anti-anxiety, antidepressant).  Function - Problem Solving Problem solving assist level: Solves basic 90% of the time/requires cueing < 10% of the time  Function - Memory Memory assist level: Recognizes or recalls 75 - 89% of the time/requires cueing 10 - 24% of the time  Medical Problem List and Plan: 1.  Left hemiparesis and dysphagia secondary to right MCA infarction  Continue CIR 2.  DVT Prophylaxis/Anticoagulation: Eliquis 3. Pain Management: Baclofen 5 mg 3 times a day 4. Mood: Provide emotional support 5. Neuropsych: This patient is capable of making decisions on his own behalf. 6. Skin/Wound Care: Routine skin checks 7. Fluids/Electrolytes/Nutrition: Routine I&Os, BMET within acceptable range on 10/23 8. Atrial fibrillation/status post aortic valve replacement. Cardiology service follow-up. Atenolol reduced to 37.5 mg due to bouts of bradycardia. Vitals:   10/31/16 1500  11/01/16 0539  BP: 121/72 121/82  Pulse: 63 60  Resp: 16 18  Temp: 97.7 F (36.5 C) 97.9 F (36.6 C)  SpO2: 99% 99%  HR in acceptable rangeOn 10/25 9. Dysphagia. Dysphagia #3 her liquids. Follow-up speech therapy 10. Hypertension. Norvasc 10 mg daily 11. Hypokalemia: Resolved. Potassium 3.7 on 10/23 12. Borderline diabetes mellitus. Hemoglobin A1c 5.6. Patient diet controlled prior to admission. Continue sliding scale for now CBG (last 3)   Recent Labs  10/31/16 1649 10/31/16 2037 11/01/16 0645  GLUCAP 99 133* 94  Controlled On 10/25 13. BPH. Proscar 5 mg daily, Flomax 0.8 mg daily.   LOS (Days) 3 A FACE TO FACE EVALUATION WAS PERFORMED  Jametta Moorehead Lorie Phenix 11/01/2016, 8:20 AM

## 2016-11-01 NOTE — IPOC Note (Signed)
Overall Plan of Care [facility]) Patient Details Name: [name] MRN: [id number] DOB: 1935/06/18  Admitting Diagnosis: Right MCA infarct  Hospital Problems: Active Problems:   Right middle cerebral artery stroke (HCC)   PAF (paroxysmal atrial fibrillation) (HCC)   Hyperglycemia     Functional Problem List: Nursing Behavior, Edema, Medication Management, Nutrition, Pain, Safety, Sensory, Skin Integrity  PT Balance, Endurance, Motor, Safety  OT Balance, Endurance  SLP    TR         Basic ADL's: OT Grooming, Bathing, Dressing, Toileting     Advanced  ADL's: OT       Transfers: PT Bed to Chair, Teacher, early years/pre, Tub/Shower     Locomotion: PT Ambulation, Stairs     Additional Impairments: OT Fuctional Use of Upper Extremity  SLP Swallowing, Social Cognition   Memory, Problem Solving, Attention  TR      Anticipated Outcomes Item Anticipated Outcome  Self Feeding    Swallowing  Mod I    Basic self-care  Mod I  Toileting  Mod I   Bathroom Transfers Mod I  Bowel/Bladder  min assist   Transfers  mod I overall  Locomotion  mod I overall  Communication     Cognition  Mod I   Pain  less<2  Safety/Judgment  min assist   Therapy Plan: PT Intensity: Minimum of 1-2 x/day ,45 to 90 minutes PT Frequency: 5 out of 7 days PT Duration Estimated Length of Stay: 7-10 days OT Intensity: Minimum of 1-2 x/day, 45 to 90 minutes OT Frequency: 5 out of 7 days OT Duration/Estimated Length of Stay: 7-10 days SLP Intensity: Minumum of 1-2 x/day, 30 to 90 minutes SLP Frequency: 3 to 5 out of 7 days SLP Duration/Estimated Length of Stay: 7-10 days     Team Interventions: Nursing Interventions Patient/Family Education, Medication Management, Pain Management, Disease Management/Prevention, Skin Care/Wound Management, Cognitive Remediation/Compensation, Psychosocial Support, Discharge Planning  PT interventions Ambulation/gait training, Balance/vestibular training,  Discharge planning, Functional mobility training, Neuromuscular re-education, Patient/family education, Stair training, Therapeutic Exercise, Therapeutic Activities, UE/LE Strength taining/ROM, UE/LE Coordination activities, Community reintegration, Psychosocial support, Engineer, drilling  OT Interventions Training and development officer, Cognitive remediation/compensation, Academic librarian, Discharge planning, DME/adaptive equipment instruction, Functional mobility training, Neuromuscular re-education, Pain management, Patient/family education, Self Care/advanced ADL retraining, UE/LE Strength taining/ROM, Therapeutic Activities, Therapeutic Exercise, UE/LE Coordination activities  SLP Interventions Cognitive remediation/compensation, Dysphagia/aspiration precaution training, English as a second language teacher, Functional tasks, Patient/family education, Therapeutic Activities, Environmental controls, Internal/external aids  TR Interventions    SW/CM Interventions Discharge Planning, Psychosocial Support, Patient/Family Education   Barriers to Discharge MD  Medical stability  Nursing      PT      OT      SLP      SW       Team Discharge Planning: Destination: PT-Home ,OT- Home , SLP-Home Projected Follow-up: PT-Outpatient PT, OT-  Outpatient OT, SLP- (TBD ) Projected Equipment Needs: PT-To be determined, OT- To be determined, SLP-To be determined Equipment Details: PT- , OT-  Patient/family involved in discharge planning: PT- Patient, Family member/caregiver,  OT-Patient, SLP-Patient  MD ELOS: 6-9 days. Medical Rehab Prognosis:  Good Assessment: 81 y.o.right handed malewith history of borderline diabetes mellitus, CAD, aortic valve replacement 08/03/2010 maintain on aspirin, hypertension. Presented 10/24/2016 with altered mental status aphasia and right gaze deviation. CT head reviewed, showing right basal ganglia infarct. Per report and MRI, acute infarction primarily in the right  basal ganglia. Acute infarct also in the insula on the right  and scattered tiny areas of acute infarction of the cerebral cortex bilaterally right greater than left. CT angiogram head and neck showed right M1 occlusion extending into both superior and inferior M2 branches. No evidence of craniocervical dissection. Underwent revascularization per interventional radiology. EKG atrial fibrillation on telemetry patient asymptomatic cardiology service is consulted. Noted bouts of bradycardia in the 30s with Tenormin reduced to 37.5 mg. Echocardiogram with ejection fraction of 60% no wall motion abnormalities. Neurology consulted and Eliquis was initiated for CVA prophylaxis in light of atrial fibrillation. Dysphagia diet. Patient with resultant functional deficits with mobility and self-care. Will set goals for mod I with PT/OT/SLP.    See Team Conference Notes for weekly updates to the plan of care

## 2016-11-01 NOTE — Progress Notes (Signed)
Speech Language Pathology Daily Session Note  Patient Details  Name: [name] MRN: [id number] Date of Birth: December 08, 1935  [name] Date: 11/01/2016 SLP Individual Time: 1751-0258 SLP Individual Time Calculation (min): 26 min  Short Term Goals: Week 1: SLP Short Term Goal 1 (Week 1): STGs=LTGs  Skilled Therapeutic Interventions:  Pt was seen for skilled ST targeting cognitive and dysphagia goals.  Pt needed supervision question cues to recall results and recommendations following yesterday's MBS but min assist verbal cues were needed to actually use strategies in the moment when consuming dys 3 solids and thin liquids.  When swallowing precautions were not adhered to pt demonstrated soft throat clearing following solids and liquids which were mitigated with head turn to the left and slow, careful sips.   Pt requested to use the bathroom during session and impulsively stood up from recliner, setting of chair alarm, with little safety awareness.  When asked, pt stated that he could take himself to the restroom without a staff member present.  Discussed rationale for current safety precautions for minimizing fall risk.  Pt verbalized understanding.  Pt was left in recliner with chair alarm set and call bell within reach.  Continue per current plan of care.     Function:  Eating Eating   Modified Consistency Diet: Yes Eating Assist Level: Supervision or verbal cues           Cognition Comprehension Comprehension assist level: Follows basic conversation/direction with extra time/assistive device  Expression   Expression assist level: Expresses basic 90% of the time/requires cueing < 10% of the time.  Social Interaction Social Interaction assist level: Interacts appropriately with others with medication or extra time (anti-anxiety, antidepressant).  Problem Solving Problem solving assist level: Solves basic 90% of the time/requires cueing < 10% of the time  Memory Memory assist level:  Recognizes or recalls 75 - 89% of the time/requires cueing 10 - 24% of the time    Pain Pain Assessment Pain Assessment: No/denies pain  Therapy/Group: Individual Therapy  Analeigh Aries, Selinda Orion 11/01/2016, 9:51 AM

## 2016-11-01 NOTE — Progress Notes (Signed)
Physical Therapy Session Note  Patient Details  Name: [name] MRN: [id number] Date of Birth: January 22, 1935  [name] Date: 11/01/2016 PT Individual Time: 1500-1525 PT Individual Time Calculation (min): 25 min   Short Term Goals: Week 1:  PT Short Term Goal 1 (Week 1): =LTGs due to LOS  Skilled Therapeutic Interventions/Progress Updates:    no c/o pain.  Session focus on pt education regarding intellectual/emergent awareness, activity tolerance, and NMR.   Pt states he feels that he has no deficits currently and could be at home.  PT reoriented pt to deficits in balance, noted by BERG Balance Assessment this AM, as well as deficits in swallowing as noted by SLP notes and cue cards in room.  Also discussed how CVAs (particularly in the region he experienced can lead to deficits in safety awareness and awareness of deficits).  Education to continue throughout LOS.   Pt ambulated to and from day room with distant supervision and min verbal cues for attending to LLE fatigue and need for rest breaks.  Kinetron x1 round in sitting to fatigue, +3 rounds in standing focus on trunk control and weight shifting in standing, rest breaks in between.  Pt returned to bed at end of session and positioned with call bell in reach and needs met.  Wife present at bedside and will have nursing staff activate bed alarm prior to leaving.    Therapy Documentation Precautions:  Precautions Precautions: Fall Restrictions Weight Bearing Restrictions: No   See Function Navigator for Current Functional Status.   Therapy/Group: Individual Therapy  Michel Santee 11/01/2016, 3:54 PM

## 2016-11-01 NOTE — Discharge Instructions (Addendum)
Inpatient Rehab Discharge Instructions  [facility] Discharge date and time: No discharge date for patient encounter.   Activities/Precautions/ Functional Status: Activity: activity as tolerated Diet: soft Wound Care: none needed Functional status:  ___ No restrictions     ___ Walk up steps independently ___ 24/7 supervision/assistance   ___ Walk up steps with assistance ___ Intermittent supervision/assistance  ___ Bathe/dress independently ___ Walk with walker     _x__ Bathe/dress with assistance ___ Walk Independently    ___ Shower independently ___ Walk with assistance    ___ Shower with assistance ___ No alcohol     ___ Return to work/school ________  Special Instructions: No driving   COMMUNITY REFERRALS UPON DISCHARGE:    Outpatient: PT, OT, SP  Agency:[facility] Phone:[phone]   Date of Last Service:11/05/2016  Appointment Date/Time:NOVEMBER 2 Friday 10:00-11:00-SPEECH  November 7 Wednesday 12:30-2:00 PM  Medical Equipment/Items Ordered:[facility] [phone]     GENERAL COMMUNITY RESOURCES FOR PATIENT/FAMILY: Support Groups:[facility] SUPPORT GROUP EVERY SECOND Thursday @ 3:00-4:00 PM ON THE REHAB UNIT QUESTIONS [place] [phone]  STROKE/TIA DISCHARGE INSTRUCTIONS SMOKING Cigarette smoking nearly doubles your risk of having a stroke & is the single most alterable risk factor  If you smoke or have smoked in the last 12 months, you are advised to quit smoking for your health.  Most of the excess cardiovascular risk related to smoking disappears within a year of stopping.  Ask you doctor about anti-smoking medications  [place] Quit Line: 1-800-QUIT NOW  Free Smoking Cessation Classes [phone]  CHOLESTEROL Know your levels; limit fat & cholesterol in your diet  Lipid Panel     Component Value Date/Time   CHOL 87 10/25/2016 0317   TRIG 54 10/25/2016 0317   HDL 26 (L) 10/25/2016 0317   CHOLHDL 3.3 10/25/2016 0317   VLDL 11 10/25/2016 0317   LDLCALC 50 10/25/2016 0317      Many patients benefit from treatment even if their cholesterol is at goal.  Goal: Total Cholesterol (CHOL) less than 160  Goal:  Triglycerides (TRIG) less than 150  Goal:  HDL greater than 40  Goal:  LDL (LDLCALC) less than 100   BLOOD PRESSURE American Stroke Association blood pressure target is less that 120/80 mm/Hg  Your discharge blood pressure is:  BP: 125/73  Monitor your blood pressure  Limit your salt and alcohol intake  Many individuals will require more than one medication for high blood pressure  DIABETES (A1c is a blood sugar average for last 3 months) Goal HGBA1c is under 7% (HBGA1c is blood sugar average for last 3 months)  Diabetes: No known diagnosis of diabetes    Lab Results  Component Value Date   HGBA1C 5.6 10/25/2016     Your HGBA1c can be lowered with medications, healthy diet, and exercise.  Check your blood sugar as directed by your physician  Call your physician if you experience unexplained or low blood sugars.  PHYSICAL ACTIVITY/REHABILITATION Goal is 30 minutes at least 4 days per week  Activity: Increase activity slowly, Therapies: Physical Therapy: Home Health Return to work:   Activity decreases your risk of heart attack and stroke and makes your heart stronger.  It helps control your weight and blood pressure; helps you relax and can improve your mood.  Participate in a regular exercise program.  Talk with your doctor about the best form of exercise for you (dancing, walking, swimming, cycling).  DIET/WEIGHT Goal is to maintain a healthy weight  Your discharge diet is: DIET DYS 3 Room service appropriate? Yes; Fluid consistency: Thin  liquids Your height is:    Your current weight is: Weight: 81.2 kg (179 lb) Your Body Mass Index (BMI) is:  BMI (Calculated): 24.27  Following the type of diet specifically designed for you will help prevent another stroke.  Your goal weight  range is:    Your goal Body Mass Index (BMI) is 19-24.  Healthy food habits can help reduce 3 risk factors for stroke:  High cholesterol, hypertension, and excess weight.  RESOURCES Stroke/Support Group:  Call [phone]   STROKE EDUCATION PROVIDED/REVIEWED AND GIVEN TO PATIENT Stroke warning signs and symptoms How to activate emergency medical system (call 911). Medications prescribed at discharge. Need for follow-up after discharge. Personal risk factors for stroke. Pneumonia vaccine given:  Flu vaccine given:  My questions have been answered, the writing is legible, and I understand these instructions.  I will adhere to these goals & educational materials that have been provided to me after my discharge from the hospital.      My questions have been answered and I understand these instructions. I will adhere to these goals and the provided educational materials after my discharge from the hospital.  Patient/Caregiver Signature _______________________________ Date __________  Clinician Signature _______________________________________ Date __________  Please bring this form and your medication list with you to all your follow-up doctor's appointments. Information on my medicine - ELIQUIS (apixaban)  This medication education was reviewed with me or my healthcare representative as part of my discharge preparation.    Why was Eliquis prescribed for you? Eliquis was prescribed for you to reduce the risk of a blood clot forming that can cause a stroke if you have a medical condition called atrial fibrillation (a type of irregular heartbeat).  What do You need to know about Eliquis ? Take your Eliquis 5mg  TWICE DAILY - one tablet in the morning and one tablet in the evening with or without food. If you have difficulty swallowing the tablet whole please discuss with your pharmacist how to take the medication safely.  Take Eliquis exactly as prescribed by your doctor and DO NOT  stop taking Eliquis without talking to the doctor who prescribed the medication.  Stopping may increase your risk of developing a stroke.  Refill your prescription before you run out.  After discharge, you should have regular check-up appointments with your healthcare provider that is prescribing your Eliquis.  In the future your dose may need to be changed if your kidney function or weight changes by a significant amount or as you get older.  What do you do if you miss a dose? If you miss a dose, take it as soon as you remember on the same day and resume taking twice daily.  Do not take more than one dose of ELIQUIS at the same time to make up a missed dose.  Important Safety Information A possible side effect of Eliquis is bleeding. You should call your healthcare provider right away if you experience any of the following: ? Bleeding from an injury or your nose that does not stop. ? Unusual colored urine (red or dark brown) or unusual colored stools (red or black). ? Unusual bruising for unknown reasons. ? A serious fall or if you hit your head (even if there is no bleeding).  Some medicines may interact with Eliquis and might increase your risk of bleeding or clotting while on Eliquis. To help avoid this, consult your healthcare  avoid this, consult your healthcare provider or pharmacist prior to using any new prescription or non-prescription medications, including herbals, vitamins, non-steroidal anti-inflammatory drugs (NSAIDs) and supplements. ° °This website has more information on Eliquis® (apixaban): http://www.eliquis.com/eliquis/home ° ° °

## 2016-11-01 NOTE — Progress Notes (Signed)
Occupational Therapy Session Note  Patient Details  Name: [name] MRN: [id number] Date of Birth: 14-Apr-1935  [name] Date: 11/01/2016 OT Individual Time: 0930-1030 OT Individual Time Calculation (min): 60 min    Short Term Goals: Week 1:  OT Short Term Goal 1 (Week 1): STG=LTG 2/2 ELOS  Skilled Therapeutic Interventions/Progress Updates:    Pt seen for ADL training with a focus on activity tolerance in standing, standing balance, L visual field awareness and general safety awareness. Pt completed grooming standing at sink, toileting, shower, dressing with steady A for ambulation in room with min cues for awareness of ambulating through doorways and watching his L side and close S with the actual self care tasks. Pt's wife arrived halfway through the session. Pt did try to stand a few times by himself.   He then ambulated to ADL apt to practice stepping over a shower ledge and sitting on low shower seat.  His wife had photos of the bathroom and it was set up to simulate his home shower.  Close S with stepping in and out of shower.  He was able to sit to stand from low leather couch without difficulty.   Pt now has full AROM of LUE, 4/5 strength, he can tie his shoes.  He ambulated to gym to work on a standing activity for 20 min with 1 rest break of assembling small parts on [place] Peg board test using L hand only with no difficulty.  Pt ambulated back to room to sit in recliner with chair alarm set.   Wife with pt.   Therapy Documentation Precautions:  Precautions Precautions: Fall Restrictions Weight Bearing Restrictions: No   Pain: Pain Assessment Pain Assessment: No/denies pain ADL: ADL ADL Comments: Please see functional navigator  See Function Navigator for Current Functional Status.   Therapy/Group: Individual Therapy  [place] 11/01/2016, 12:19 PM

## 2016-11-01 NOTE — Progress Notes (Signed)
Speech Language Pathology Daily Session Note  Patient Details  Name: [name] MRN: [id number] Date of Birth: Jan 19, 1935  [name] Date: 11/01/2016 SLP Individual Time: 1300-1400 SLP Individual Time Calculation (min): 60 min  Short Term Goals: Week 1: SLP Short Term Goal 1 (Week 1): STGs=LTGs  Skilled Therapeutic Interventions: Skilled treatment session focused on dysphagia and cognitive goals. SLP facilitated session by providing Min A verbal cues for utilization of swallowing compensatory strategies with thin liquids via cup. Patient consumed trials with intermittent throat clearing which was reduced with the use of a complete head turn. SLP also facilitated session by providing supervision verbal cues for recall of his current medications and for complex problem solving while organizing a BID pill box. Patient left supine in bed at end of session with all needs within reach. Continue with current plan of care.      Function:  Eating Eating   Modified Consistency Diet: Yes Eating Assist Level: Supervision or verbal cues           Cognition Comprehension Comprehension assist level: Follows basic conversation/direction with extra time/assistive device  Expression   Expression assist level: Expresses basic 90% of the time/requires cueing < 10% of the time.  Social Interaction Social Interaction assist level: Interacts appropriately with others with medication or extra time (anti-anxiety, antidepressant).  Problem Solving Problem solving assist level: Solves basic 90% of the time/requires cueing < 10% of the time  Memory Memory assist level: Recognizes or recalls 75 - 89% of the time/requires cueing 10 - 24% of the time    Pain Pain Assessment Pain Assessment: No/denies pain  Therapy/Group: Individual Therapy  Aubery Douthat, [place] 11/01/2016, 3:31 PM

## 2016-11-02 ENCOUNTER — Inpatient Hospital Stay (HOSPITAL_COMMUNITY): Payer: Medicare Other

## 2016-11-02 ENCOUNTER — Inpatient Hospital Stay (HOSPITAL_COMMUNITY): Payer: Self-pay | Admitting: Physical Therapy

## 2016-11-02 ENCOUNTER — Inpatient Hospital Stay (HOSPITAL_COMMUNITY): Payer: Medicare Other | Admitting: Speech Pathology

## 2016-11-02 DIAGNOSIS — I4891 Unspecified atrial fibrillation: Secondary | ICD-10-CM

## 2016-11-02 LAB — GLUCOSE, CAPILLARY
Glucose-Capillary: 95 mg/dL (ref 65–99)
Glucose-Capillary: 118 mg/dL — ABNORMAL HIGH (ref 65–99)
Glucose-Capillary: 90 mg/dL (ref 65–99)
Glucose-Capillary: 107 mg/dL — ABNORMAL HIGH (ref 65–99)

## 2016-11-02 NOTE — Progress Notes (Signed)
Occupational Therapy Session Note  Patient Details  Name: [name] MRN: [id number] Date of Birth: 1935/02/12  [name] Date: 11/02/2016 OT Individual Time: 4035-2481 OT Individual Time Calculation (min): 56 min    Short Term Goals: Week 1:  OT Short Term Goal 1 (Week 1): STG=LTG 2/2 ELOS  Skilled Therapeutic Interventions/Progress Updates:    1:1. Pt requesting to shower upon entering. Pt gathers needed items for bathing and dressing with supervision and question cues. Pt completes bathing and dressing with supervision overall and VC for safety awareness (not standing on one leg to doff shorts/dry LE) to decrease fall risk. Pt ambulates to gift shop with VC to use signs for pathfinding, look forward when walking and take longer strides with LLE. Pt locates 3 designated items by OT with increased time and VC for scanning L. Pt able to find path back to room with increased time. Exited session with pt seated in bed with call light in reach and all needs met.  Therapy Documentation Precautions:  Precautions Precautions: Fall Restrictions Weight Bearing Restrictions: No General:   Vital Signs:   Pain:   ADL: ADL ADL Comments: Please see functional navigator Vision   Perception    Praxis   Exercises:   Other Treatments:    See Function Navigator for Current Functional Status.   Therapy/Group: Individual Therapy  Tonny Branch 11/02/2016, 6:46 AM

## 2016-11-02 NOTE — Progress Notes (Addendum)
Social Work Patient ID: [name], male   DOB: 1935/06/25, 81 y.o.   MRN: [id number]  Team feels pt is reaching his goals sooner than 10/31, want discharge 10/29 after therapies. Awaiting MD approval. Pt and wife aware and in agreement. [name] voiced no medical concerns to keep him here. Agreeable to OP therapies due to high functional level. Work on discharge needs. MD agreeable to this. Plan discharge Monday after therapies.

## 2016-11-02 NOTE — Progress Notes (Signed)
Subjective/Complaints: Patient seen lying in bed this morning.  He states he slept well overnight.  He wants to know who performed his operation.  ROS -denies CP, SOB, nausea, vomiting, diarrhea.  Objective: Vital Signs: Blood pressure 124/70, pulse 63, temperature 98 F (36.7 C), temperature source Oral, resp. rate 17, weight 81.2 kg (179 lb), SpO2 99 %. Dg Swallowing Func-speech Pathology  Result Date: 10/31/2016 Objective Swallowing Evaluation: Type of Study: MBS-Modified Barium Swallow Study Patient Details Name: [name] MRN: [id number] Date of Birth: Oct 31, 1935 [name] Date: 10/31/2016 Time: SLP Start Time (ACUTE ONLY): 0905-SLP Stop Time (ACUTE ONLY): 0930 SLP Time Calculation (min) (ACUTE ONLY): 25 min Past Medical History: Past Medical History: Diagnosis Date . Bladder stones  . Borderline diabetes  . BPH (benign prostatic hyperplasia)  . Coronary artery disease   cardiologist-  dr Nat Math gerhart NP--- per cath 06-02-2010 non-obstructive cad pLAD 30-40% . Diverticulosis of colon  . DOE (dyspnea on exertion)  . Gout  . History of adenomatous polyp of colon   2002-- tubular adenoma . History of aortic insufficiency   severe -- s/p  AVR 08-03-2010 . History of basal cell carcinoma (BCC) excision   10/ 2015  left ear . History of small bowel obstruction   02/ 2007 mechanical sbo s/p  surgical intervention;  partial sbo 09/ 2011 and 03-20-2011 resolved without surgical intervention . History of squamous cell carcinoma excision   2014 right foot . History of urinary retention  . HTN (hypertension)  . Peripheral neuropathy  . S/P aortic valve replacement with prosthetic valve 08/03/2010  tissue valve . S/P patent foramen ovale closure 08/03/2010  at same time AVR Past Surgical History: Past Surgical History: Procedure Laterality Date . CARDIAC CATHETERIZATION  06-02-2010  dr Marlou Porch  non-obstructive cad- pLAD 30-40%/  normal LVSF/  severe AI . CARDIOVASCULAR STRESS TEST  04/12/2016  Low  risk nuclear perfusion study w/ no significant reversible ischemia/  normal LV function and wall motion ,  stress ef 60%/  32mm inferior and lateral scooped ST-segment depression w/ exercise (may be repolarization abnormality), exercise capacity was moderately reduced . CATARACT EXTRACTION W/ INTRAOCULAR LENS  IMPLANT, BILATERAL  02/2010 . CYSTOSCOPY WITH LITHOLAPAXY N/A 06/05/2016  Procedure: CYSTOSCOPY WITH LITHOLAPAXY and fulgarization of bladder neck;  Surgeon: Irine Seal, MD;  Location: [facility];  Service: Urology;  Laterality: N/A; . EXPLORATORY LAPARTOMY /  CHOLECYSTECTOMY  02/28/2005  for Small  bowel obstruction (mechnical) . IR ANGIO EXTRACRAN SEL COM CAROTID INNOMINATE UNI L MOD SED  10/24/2016 . IR ANGIO VERTEBRAL SEL SUBCLAVIAN INNOMINATE BILAT MOD SED  10/24/2016 . IR PERCUTANEOUS ART THROMBECTOMY/INFUSION INTRACRANIAL INC DIAG ANGIO  10/24/2016 . LEFT KNEE ARTHROSCOPY  2006 . RADIOLOGY WITH ANESTHESIA N/A 10/24/2016  Procedure: RADIOLOGY WITH ANESTHESIA;  Surgeon: Luanne Bras, MD;  Location: [facility];  Service: Radiology;  Laterality: N/A; . RIGHT FOOT SURGERY   . RIGHT MINIATURE ANTERIOR THORACOTOMY FOR AORTIC VALVE REPLACEMENT AND CLOSURE PATENT FORAMEN OVALE  08-03-2010  DR Levada Schilling Magna-ease pericardial tissue valve (20mm) . TRANSTHORACIC ECHOCARDIOGRAM  05/30/2016  dr skains  moderate  LVH ef 60-65%/  bioprothesis aortic valve present ,normal grandient and no AI /  mild MV calcification , moderate MR /  mild PR/ moderate TR/  PASP 5mmHg/ (RA denisty was identified 04-27-2016 echo) and is seen again today, this is likely a promient eustacian ridge, atrium is normal size HPI: See H&P Subjective: pleasant Assessment / Plan / Recommendation CHL IP CLINICAL  IMPRESSIONS 10/31/2016 Clinical Impression Patient demonstrates a mild pharyngeal motor based dysphagia characterized by decreased base of tongue retraction, decreased epiglottic inversion and decreased pharyngeal  constriction resulting in mild-moderate vallecular and pyriform sinus residue. Patient also demonstrated intermittent trace penetration that was cleared with a spontaneous throat clear. Residue and penetration episodes were reduced with a head turn to the left. Suspect patient had some level of baseline dysphagia due to what appeared to be prominent CP that is now further exacerbated by deconditioning from recent CVA. Recommend Dys. 3 textures with thin liquids with use of ahead turn to the left with both liquids and solids. Patient verbalized understanding and agreement.  SLP Visit Diagnosis Dysphagia, pharyngoesophageal phase (R13.14) Attention and concentration deficit following -- Frontal lobe and executive function deficit following -- Impact on safety and function Mild aspiration risk   CHL IP TREATMENT RECOMMENDATION 10/31/2016 Treatment Recommendations Therapy as outlined in treatment plan below   Prognosis 10/31/2016 Prognosis for Safe Diet Advancement Good Barriers to Reach Goals -- Barriers/Prognosis Comment -- CHL IP DIET RECOMMENDATION 10/31/2016 SLP Diet Recommendations Dysphagia 3 (Mech soft) solids;Thin liquid Liquid Administration via Cup;No straw Medication Administration Whole meds with puree Compensations Slow rate;Small sips/bites Postural Changes Seated upright at 90 degrees;Remain semi-upright after after feeds/meals (Comment)   CHL IP OTHER RECOMMENDATIONS 10/31/2016 Recommended Consults -- Oral Care Recommendations Oral care BID Other Recommendations --   CHL IP FOLLOW UP RECOMMENDATIONS 10/31/2016 Follow up Recommendations (No Data)   CHL IP FREQUENCY AND DURATION 10/31/2016 Speech Therapy Frequency (ACUTE ONLY) min 3x week Treatment Duration 1 week      CHL IP ORAL PHASE 10/31/2016 Oral Phase Impaired Oral - Pudding Teaspoon -- Oral - Pudding Cup -- Oral - Honey Teaspoon -- Oral - Honey Cup -- Oral - Nectar Teaspoon -- Oral - Nectar Cup -- Oral - Nectar Straw -- Oral - Thin Teaspoon WFL  Oral - Thin Cup WFL Oral - Thin Straw -- Oral - Puree WFL Oral - Mech Soft Impaired mastication Oral - Regular -- Oral - Multi-Consistency -- Oral - Pill -- Oral Phase - Comment --  CHL IP PHARYNGEAL PHASE 10/31/2016 Pharyngeal Phase Impaired Pharyngeal- Pudding Teaspoon -- Pharyngeal -- Pharyngeal- Pudding Cup -- Pharyngeal -- Pharyngeal- Honey Teaspoon -- Pharyngeal -- Pharyngeal- Honey Cup -- Pharyngeal -- Pharyngeal- Nectar Teaspoon -- Pharyngeal -- Pharyngeal- Nectar Cup -- Pharyngeal -- Pharyngeal- Nectar Straw -- Pharyngeal -- Pharyngeal- Thin Teaspoon Reduced epiglottic inversion;Pharyngeal residue - valleculae;Pharyngeal residue - pyriform Pharyngeal -- Pharyngeal- Thin Cup Reduced epiglottic inversion;Reduced airway/laryngeal closure;Penetration/Aspiration during swallow;Pharyngeal residue - pyriform;Pharyngeal residue - valleculae;Pharyngeal residue - cp segment;Compensatory strategies attempted (with notebox) Pharyngeal Material enters airway, CONTACTS cords and then ejected out;Material enters airway, remains ABOVE vocal cords and not ejected out Pharyngeal- Thin Straw -- Pharyngeal -- Pharyngeal- Puree Reduced epiglottic inversion;Pharyngeal residue - valleculae;Pharyngeal residue - cp segment;Reduced tongue base retraction Pharyngeal -- Pharyngeal- Mechanical Soft Reduced tongue base retraction;Pharyngeal residue - valleculae;Pharyngeal residue - pyriform;Pharyngeal residue - cp segment;Reduced epiglottic inversion Pharyngeal -- Pharyngeal- Regular -- Pharyngeal -- Pharyngeal- Multi-consistency -- Pharyngeal -- Pharyngeal- Pill -- Pharyngeal -- Pharyngeal Comment --  CHL IP CERVICAL ESOPHAGEAL PHASE 10/31/2016 Cervical Esophageal Phase Impaired Pudding Teaspoon -- Pudding Cup -- Honey Teaspoon -- Honey Cup -- Nectar Teaspoon -- Nectar Cup -- Nectar Straw -- Thin Teaspoon -- Thin Cup -- Thin Straw -- Puree -- Mechanical Soft -- Regular -- Multi-consistency -- Pill -- Cervical Esophageal Comment Patient  appeared to have a prominent CP, also with cervial osteophytes  No flowsheet data found. Weston Anna, [place], CCC-SLP [phone] [place], [place] 10/31/2016, 11:30 AM              Results for orders placed or performed during the hospital encounter of 10/29/16 (from the past 72 hour(s))  Glucose, capillary     Status: None   Collection Time: 10/30/16 12:09 PM  Result Value Ref Range   Glucose-Capillary 99 65 - 99 mg/dL  Glucose, capillary     Status: Abnormal   Collection Time: 10/30/16  4:28 PM  Result Value Ref Range   Glucose-Capillary 102 (H) 65 - 99 mg/dL  Glucose, capillary     Status: Abnormal   Collection Time: 10/30/16  8:50 PM  Result Value Ref Range   Glucose-Capillary 113 (H) 65 - 99 mg/dL  Glucose, capillary     Status: Abnormal   Collection Time: 10/31/16  6:35 AM  Result Value Ref Range   Glucose-Capillary 100 (H) 65 - 99 mg/dL  Glucose, capillary     Status: Abnormal   Collection Time: 10/31/16 11:42 AM  Result Value Ref Range   Glucose-Capillary 112 (H) 65 - 99 mg/dL  Glucose, capillary     Status: None   Collection Time: 10/31/16  4:49 PM  Result Value Ref Range   Glucose-Capillary 99 65 - 99 mg/dL  Glucose, capillary     Status: Abnormal   Collection Time: 10/31/16  8:37 PM  Result Value Ref Range   Glucose-Capillary 133 (H) 65 - 99 mg/dL  Glucose, capillary     Status: None   Collection Time: 11/01/16  6:45 AM  Result Value Ref Range   Glucose-Capillary 94 65 - 99 mg/dL  Glucose, capillary     Status: Abnormal   Collection Time: 11/01/16 12:12 PM  Result Value Ref Range   Glucose-Capillary 109 (H) 65 - 99 mg/dL  Glucose, capillary     Status: Abnormal   Collection Time: 11/01/16  4:22 PM  Result Value Ref Range   Glucose-Capillary 115 (H) 65 - 99 mg/dL  Glucose, capillary     Status: Abnormal   Collection Time: 11/01/16  9:39 PM  Result Value Ref Range   Glucose-Capillary 102 (H) 65 - 99 mg/dL  Glucose, capillary     Status: None   Collection Time:  11/02/16  6:37 AM  Result Value Ref Range   Glucose-Capillary 95 65 - 99 mg/dL     HEENT: Normocephalic. Atraumatic. Cardio: IRIR and no JVD Resp: CTA B/L and unlabored GI: Soft and ND Skin:   Intact and Wound Right groin cath site CDI Neuro: Alert/Oriented Motor: RUE/RLE: 5/5 proximal to distal LUE/LLE: 4+/5 proximal to distal (stable) Musc/Skel:  No edema. No tenderness. Gen NAD. Vital signs reviewed.   Assessment/Plan: 1. Functional deficits secondary to RIght MCA infarct which require 3+ hours per day of interdisciplinary therapy in a comprehensive inpatient rehab setting. Physiatrist is providing close team supervision and 24 hour management of active medical problems listed below. Physiatrist and rehab team continue to assess barriers to discharge/monitor patient progress toward functional and medical goals. FIM: Function - Bathing Position: Shower Body parts bathed by patient: Right arm, Left arm, Abdomen, Chest, Front perineal area, Buttocks, Left upper leg, Right upper leg, Right lower leg, Left lower leg Assist Level: Touching or steadying assistance(Pt > 75%)  Function- Upper Body Dressing/Undressing What is the patient wearing?: Pull over shirt/dress Pull over shirt/dress - Perfomed by patient: Thread/unthread right sleeve, Thread/unthread left sleeve, Put head through opening, Pull shirt over  trunk Assist Level: Touching or steadying assistance(Pt > 75%) Function - Lower Body Dressing/Undressing What is the patient wearing?: Pants, Socks, Shoes Position: Sitting EOB Pants- Performed by patient: Thread/unthread left pants leg, Thread/unthread right pants leg Pants- Performed by helper: Pull pants up/down Non-skid slipper socks- Performed by patient: Don/doff right sock, Don/doff left sock Socks - Performed by patient: Don/doff left sock, Don/doff right sock Shoes - Performed by patient: Don/doff right shoe, Don/doff left shoe Shoes - Performed by helper: Fasten  right, Fasten left Assist for footwear: Supervision/touching assist Assist for lower body dressing: Touching or steadying assistance (Pt > 75%)  Function - Toileting Toileting steps completed by patient: Adjust clothing prior to toileting, Performs perineal hygiene, Adjust clothing after toileting Assist level: Touching or steadying assistance (Pt.75%)  Function - Toilet Transfers Toilet transfer assistive device: Grab bar Assist level to toilet: Touching or steadying assistance (Pt > 75%) Assist level from toilet: Touching or steadying assistance (Pt > 75%)  Function - Chair/bed transfer Chair/bed transfer method: Ambulatory Chair/bed transfer assist level: Touching or steadying assistance (Pt > 75%) Chair/bed transfer assistive device: Armrests  Function - Locomotion: Wheelchair Will patient use wheelchair at discharge?: No Function - Locomotion: Ambulation Assistive device: No device Max distance: 150' Assist level: Touching or steadying assistance (Pt > 75%) Assist level: Touching or steadying assistance (Pt > 75%) Assist level: Touching or steadying assistance (Pt > 75%) Assist level: Touching or steadying assistance (Pt > 75%) Assist level: Touching or steadying assistance (Pt > 75%)  Function - Comprehension Comprehension: Auditory Comprehension assist level: Follows basic conversation/direction with extra time/assistive device  Function - Expression Expression: Verbal Expression assist level: Expresses basic 90% of the time/requires cueing < 10% of the time.  Function - Social Interaction Social Interaction assist level: Interacts appropriately with others with medication or extra time (anti-anxiety, antidepressant).  Function - Problem Solving Problem solving assist level: Solves basic 90% of the time/requires cueing < 10% of the time  Function - Memory Memory assist level: Recognizes or recalls 75 - 89% of the time/requires cueing 10 - 24% of the time Patient  normally able to recall (first 3 days only): Current season, That he or she is in a hospital  Medical Problem List and Plan: 1.  Left hemiparesis and dysphagia secondary to right MCA infarction  Continue CIR 2.  DVT Prophylaxis/Anticoagulation: Eliquis 3. Pain Management: Baclofen 5 mg 3 times a day 4. Mood: Provide emotional support 5. Neuropsych: This patient is capable of making decisions on his own behalf. 6. Skin/Wound Care: Routine skin checks 7. Fluids/Electrolytes/Nutrition: Routine I&Os, BMET within acceptable range on 10/23 8. Atrial fibrillation/status post aortic valve replacement. Cardiology service follow-up. Atenolol reduced to 37.5 mg due to bouts of bradycardia. Vitals:   11/01/16 1500 11/02/16 0540  BP: 126/88 124/70  Pulse: 67 63  Resp: 18 17  Temp: 98.7 F (37.1 C) 98 F (36.7 C)  SpO2: 98% 99%  HR in acceptable rangeOn 10/26 9. Dysphagia. Dysphagia #3 her liquids. Follow-up speech therapy 10. Hypertension. Norvasc 10 mg daily  Controlled on 10/26 11. Hypokalemia: Resolved. Potassium 3.7 on 10/23 12. Borderline diabetes mellitus. Hemoglobin A1c 5.6. Patient diet controlled prior to admission. Continue sliding scale for now CBG (last 3)   Recent Labs  11/01/16 1622 11/01/16 2139 11/02/16 0637  GLUCAP 115* 102* 95  Controlled On 10/26 13. BPH. Proscar 5 mg daily, Flomax 0.8 mg daily.   LOS (Days) 4 A FACE TO FACE EVALUATION WAS PERFORMED  Braysen Cloward Maurene Capes  Traevon Meiring 11/02/2016, 7:35 AM

## 2016-11-02 NOTE — Progress Notes (Signed)
Speech Language Pathology Daily Session Note  Patient Details  Name: SIMONE TUCKEY MRN: [id number] Date of Birth: 1935-11-17  [name] Date: 11/02/2016 SLP Individual Time: 0830-0930 SLP Individual Time Calculation (min): 60 min  Short Term Goals: Week 1: SLP Short Term Goal 1 (Week 1): STGs=LTGs  Skilled Therapeutic Interventions: Skilled treatment session focused on cognitive goals. SLP facilitated session by providing session by providing Min A verbal cues for problem solving during a deductive reasoning task that focused on mathematics. Patient independently requested to use the commode and required Min A verbal cues for safety during task. Patient left upright in bed with wife present. Continue with current plan of care.      Function:  Cognition Comprehension Comprehension assist level: Follows basic conversation/direction with extra time/assistive device  Expression   Expression assist level: Expresses basic 90% of the time/requires cueing < 10% of the time.  Social Interaction Social Interaction assist level: Interacts appropriately with others with medication or extra time (anti-anxiety, antidepressant).  Problem Solving Problem solving assist level: Solves basic 90% of the time/requires cueing < 10% of the time  Memory Memory assist level: Recognizes or recalls 75 - 89% of the time/requires cueing 10 - 24% of the time    Pain No/Denies Pain   Therapy/Group: Individual Therapy  Lawerence Dery 11/02/2016, 3:57 PM

## 2016-11-02 NOTE — Progress Notes (Signed)
Physical Therapy Session Note  Patient Details  Name: [name] MRN: [id number] Date of Birth: 1935-08-10  [name] Date: 11/02/2016 PT Individual Time: 1100-1200, 1600-1630 PT Individual Time Calculation (min): 60 min, 30 min   Short Term Goals: Week 1:  PT Short Term Goal 1 (Week 1): =LTGs due to LOS  Skilled Therapeutic Interventions/Progress Updates:    Session 1: no c/o pain throughout session; session focused on functional mobility, standing balance, and standing activity tolerance.   Pt performed ambulatory transfers throughout session with supervision for safety. Pt amb 300' down to Dynavision. Pt performed Dynavision to work on standing tolerance and balance, mode B, 1 min with 20/20 targets and 1.70 sec reaction time, 2 min 40/40 targets and 1.56 sec, and 2 min on foam 41/41 targets and 1.65 sec. Pt performed Dynavision in standing, mode A, 4 min, on foam, 171 targets and 1.40 sec reaction time to work on standing activity tolerance. Dynavision also worked on pt's visual scanning and reaction time. Pt worked on tabletop puzzle standing at hi-low table, 2 min standing, 5 minute rest break, 2 min standing on foam, 8 min rest break, then 2 min on puzzle followed by amb 300' back to room with supervision. Pt left supine in bed, bed alarm on, wife present, all needs addressed.   Session 2: no c/o pain throughout session; session focused on memory and activity tolerance.   Pt performs bed mobility at start and end of session with supervision for safety. Pt participated in scavenger hunt around rehab floor finding the kinetron, dynavision, parallel bars, apartment kitchen, car simulator, and [employer]. Pt required sitting rest break halfway through the scavenger hunt. Pt had to ask for directions to find car simulator but was able to follow directions and locate car simulator. Pt wished to brush teeth and performed oral hygiene standing at sink with supervision. Pt left supine  in bed, bed alarm set, call bell within reach, all needs addressed.   Therapy Documentation Precautions:  Precautions Precautions: Fall Restrictions Weight Bearing Restrictions: No  See Function Navigator for Current Functional Status.   Therapy/Group: Individual Therapy  Harriet Masson 11/02/2016, 12:51 PM

## 2016-11-02 NOTE — Progress Notes (Signed)
Progress Note  Patient Name: [name] Date of Encounter: 11/02/2016  Primary Cardiologist: Marlou Porch  Subjective   Feels well, eating a complete diet without choking or coughing.  Progressing with physical therapy.  Anticipated discharge next Tuesday.  His wife has looked at her medication formally and Eliquis does not appear to be on it.  Inpatient Medications    Scheduled Meds: . amLODipine  10 mg Oral Daily  . apixaban  5 mg Oral BID  . atenolol  25 mg Oral Daily  . finasteride  5 mg Oral Daily  . insulin aspart  0-9 Units Subcutaneous TID AC & HS  . tamsulosin  0.8 mg Oral QPC breakfast   Continuous Infusions:  PRN Meds: acetaminophen, bisacodyl, ondansetron **OR** ondansetron (ZOFRAN) IV, senna-docusate, sorbitol   Vital Signs    Vitals:   11/01/16 0539 11/01/16 1500 11/02/16 0540 11/02/16 0953  BP: 121/82 126/88 124/70 125/73  Pulse: 60 67 63 65  Resp: 18 18 17    Temp: 97.9 F (36.6 C) 98.7 F (37.1 C) 98 F (36.7 C)   TempSrc: Oral Oral Oral   SpO2: 99% 98% 99%   Weight: 179 lb (81.2 kg)  179 lb (81.2 kg)     Intake/Output Summary (Last 24 hours) at 11/02/16 1400 Last data filed at 11/02/16 0800  Gross per 24 hour  Intake              360 ml  Output                0 ml  Net              360 ml   Filed Weights   10/31/16 0325 11/01/16 0539 11/02/16 0540  Weight: 183 lb 8.4 oz (83.2 kg) 179 lb (81.2 kg) 179 lb (81.2 kg)     Physical Exam  Minimal facial asymmetry appears relaxed and comfortable GEN: No acute distress.   Neck: No JVD Cardiac:  Irregular, no murmurs, rubs, or gallops.  Respiratory: Clear to auscultation bilaterally. GI: Soft, nontender, non-distended  MS: No edema; No deformity. Neuro:   Mild weakness of left lower extremity Psych: Normal affect   Labs    Chemistry Recent Labs Lab 10/28/16 0240 10/29/16 0216 10/30/16 0536  NA 138 138 140  K 3.4* 3.9 3.7  CL 110 110 108  CO2 19* 20* 26  GLUCOSE 93 103* 103*    BUN 9 8 8   CREATININE 0.67 0.68 0.73  CALCIUM 8.3* 8.6* 8.8*  PROT  --   --  6.3*  ALBUMIN  --   --  3.4*  AST  --   --  27  ALT  --   --  16*  ALKPHOS  --   --  55  BILITOT  --   --  1.1  GFRNONAA >60 >60 >60  GFRAA >60 >60 >60  ANIONGAP 9 8 6      Hematology Recent Labs Lab 10/28/16 0240 10/29/16 0216 10/30/16 0536  WBC 7.9 6.6 5.9  RBC 3.26* 3.48* 3.53*  HGB 10.6* 11.3* 11.2*  HCT 31.1* 33.1* 33.6*  MCV 95.4 95.1 95.2  MCH 32.5 32.5 31.7  MCHC 34.1 34.1 33.3  RDW 13.5 13.6 13.5  PLT 97* 111* 127*    Cardiac EnzymesNo results for input(s): TROPONINI in the last 168 hours. No results for input(s): TROPIPOC in the last 168 hours.   BNPNo results for input(s): BNP, PROBNP in the last 168 hours.   DDimer No results  for input(s): DDIMER in the last 168 hours.   Radiology    No results found.  Cardiac Studies   ECHO 10/27/2016 - Left ventricle: The cavity size was normal. Wall thickness wasincreased in a pattern of moderate LVH. There was mild focalbasal hypertrophy of the septum. Systolic function was normal.The estimated ejection fraction was in the range of 55% to 60%.Wall motion was normal; there were no regional wall motionabnormalities. - Aortic valve: A bioprosthesis was present. Valve area (VTI): 1.62cm^2. Valve area (Vmax): 1.34 cm^2. Valve area (Vmean): 1.39cm^2. - Aortic root: The aortic root was mildly dilated. - Mitral valve: There was moderate regurgitation. - Left atrium: The atrium was severely dilated. - Tricuspid valve: There was severe regurgitation. - Pulmonary arteries: Systolic pressure was moderately increased. PA peak pressure: 51 mm Hg (S). - Line: A venous catheter was visualized in the right atrium.  Impressions:  - Normal LV systolic function; moderate LVH; s/p AVR with mean gradient 14 mmHg; mildly dilated aortic root; moderate MR; severe LAE; severe TR with moderately elevated.  Myoview Study Highlights  04/2016    Nuclear stress EF: 60%.  Blood pressure demonstrated a normal response to exercise.  Horizontal ST segment depression ST segment depression of 2 mm was noted during stress in the II, III, aVF, V6, V5 and V4 leads.  This is a low risk study.  ST segment depression of 2 mm was noted during stress in the II, III, aVF, V6, V5 and V4 leads.  The test was stopped because the patient complained of fatigue and shortness of breath  Overall, the patient's exercise capacity was moderately impaired  [facility] Treadmill Score: intermediate risk  No significant reversible ischemia. LVEF 60% with normal wall motion. 2 mm inferior and lateral scooped ST-segment depression with exercise, may be repolarization abnormality. Exercise capacity was moderately reduced. Overall, however, this is a low risk study.    Patient Profile     81 y.o. male with  L hemiparesis and dysphagia following acute embolic CVA (minor hemorrhagic transformation) and newly diagnosed AFib, previous bioprosthetic AVR for AI.  Assessment & Plan    1. Atrial fibrillation: newly diagnosed, asymptomatic, probable cause of embolic stroke. Well rate controlled very low-dose atenolol, without symptoms of bradycardia This patients CHA2DS2-VASc Score and unadjusted Ischemic Stroke Rate (% per year) is equal to 9.7 % stroke rate/year from a score of 6(HTN, DM, stroke, age). Will ask case manager to confirm whether or not he has coverage for Eliquis.  If not we will switch to Xarelto 2. HTN:BP in desirable range 3. S/P AVR: normal prosthetic valve function by current echo. Also had PFO repair at the time. 4. L hemiparesis and swallowing problems after CVA: Good progress with rehabilitation. Patient reports he will be discharged next Tuesday.  We will make arrangements for follow-up appointment within the next couple of weeks.  He last saw Cecille Rubin in the office and was very happy with their interaction.  For questions or updates,  please contact [place] Please consult [email or web address] for contact info under Cardiology/STEMI.      Signed, Sanda Klein, MD  11/02/2016, 2:00 PM

## 2016-11-03 ENCOUNTER — Inpatient Hospital Stay (HOSPITAL_COMMUNITY): Payer: Medicare Other | Admitting: Physical Therapy

## 2016-11-03 ENCOUNTER — Inpatient Hospital Stay (HOSPITAL_COMMUNITY): Payer: Medicare Other | Admitting: Speech Pathology

## 2016-11-03 ENCOUNTER — Inpatient Hospital Stay (HOSPITAL_COMMUNITY): Payer: Medicare Other

## 2016-11-03 LAB — GLUCOSE, CAPILLARY
Glucose-Capillary: 96 mg/dL (ref 65–99)
Glucose-Capillary: 125 mg/dL — ABNORMAL HIGH (ref 65–99)
Glucose-Capillary: 131 mg/dL — ABNORMAL HIGH (ref 65–99)
Glucose-Capillary: 86 mg/dL (ref 65–99)

## 2016-11-03 NOTE — Progress Notes (Signed)
Occupational Therapy Session Note  Patient Details  Name: CARLAS VANDYNE MRN: [id number] Date of Birth: 06-Jun-1935  [name] Date: 11/03/2016 OT Individual Time: 0700-0755 OT Individual Time Calculation (min): 55 min    Short Term Goals: Week 1:  OT Short Term Goal 1 (Week 1): STG=LTG 2/2 ELOS  Skilled Therapeutic Interventions/Progress Updates:    1:1. PT awake upon entering room with no c/o pain. Pt selects clothes out of drawer and gathers needed items for shower with 1 VC. Pt bathes at sit to stand level with distant supervision and 1 VC to wash/dry BLE in sedated to decrease fall risk. Pt dresses at sit to stand level with supervision without AD. Pt grooms in standing at sink with supervision. Pt ambulates thoughout session with no AD and 1 LOB laterally pt able to correct wihtout A. Pt completes simulated grocery shopping task recalling 4/6 items correctly and completing money management with min question cues to identify mistakes. Pt wife arrives and OT trains wife to supervise pt to bathroom and safety plan is updated. Exited session with pt seated in bed with wife present and call light in reach.  Therapy Documentation Precautions:  Precautions Precautions: Fall Restrictions Weight Bearing Restrictions: No  See Function Navigator for Current Functional Status.   Therapy/Group: Individual Therapy  Tonny Branch 11/03/2016, 7:08 AM

## 2016-11-03 NOTE — Progress Notes (Signed)
Speech Language Pathology Daily Session Note  Patient Details  Name: [name] MRN: [id number] Date of Birth: [birth year]  [name] Date: 11/03/2016 SLP Individual Time: 45-1415 SLP Individual Time Calculation (min): 30 min  Short Term Goals: Week 1: SLP Short Term Goal 1 (Week 1): STGs=LTGs  Skilled Therapeutic Interventions: Skilled treatment session focused on dysphagia goals. SLP facilitiated session by answering questions related to use of head turn with swallow. Reviewed results of MBS with pt and wife. Head turn is used to prevent penetration not for ease of swallow function ("to make swallowing easier). Pt able to demonstrate use of head turn. Handout given on dysphagia food items and all questions answered at this time. Pt left upright in bed and wife present. Continue per current plan of care.      Function:  Eating Eating   Modified Consistency Diet: No Eating Assist Level: Supervision or verbal cues           Cognition Comprehension Comprehension assist level: Follows basic conversation/direction with extra time/assistive device  Expression   Expression assist level: Expresses basic 90% of the time/requires cueing < 10% of the time.  Social Interaction Social Interaction assist level: Interacts appropriately with others with medication or extra time (anti-anxiety, antidepressant).  Problem Solving Problem solving assist level: Solves basic 90% of the time/requires cueing < 10% of the time  Memory Memory assist level: Recognizes or recalls 75 - 89% of the time/requires cueing 10 - 24% of the time    Pain    Therapy/Group: Individual Therapy  Nala Kachel 11/03/2016, 2:56 PM

## 2016-11-03 NOTE — Progress Notes (Signed)
Physical Therapy Session Note  Patient Details  Name: [name] MRN: [id number] Date of Birth: Dec 18, 1935  [name] Date: 11/03/2016 PT Individual Time: 4827-0786 AND [phone] PT Individual Time Calculation (min): 70 min AND 30 min  Short Term Goals: Week 1:  PT Short Term Goal 1 (Week 1): =LTGs due to LOS  Skilled Therapeutic Interventions/Progress Updates:   Session 1:  Pt supine upon arrival and agreeable to therapy, no c/o pain. Worked on endurance and activity tolerance this session while performing functional activities that mimic home environment. Transferred to EOB and pt put on shoes and jacket w/ supervision. Ambulated to/from day room w/ close supervision w/o AD for endurance and worked on puzzle in standing w/ brief seated rest breaks. Encouraged pt to perform activities such as pulling out a chair to sit or cleaning up w/o assistance and while standing - Min guard to close supervision for these activities. Ambulated back to room to use bathroom w/ supervision then ambulated to/from outside to practice ambulating on uneven surfaces and negotiating steps. Returned to room and ended session in supine and in care of wife, all needs met. Verbal cues throughout session for safety awareness, balance strategies, pausing when out of breath.   Session 2:  Pt supine upon arrival and agreeable to therapy, no c/o pain. Worked on endurance this session. Pt transferred to EOB and donned shoes w/ supervision and ambulated to/from day room w/ close supervision, ~200' each way. Seated rest after walk to day room 2/2 fatigue and performed 10 min on NuStep @ L2. Verbal cues for breathing to help decrease work of breathing. Returned to room w/ supervision and ended session in supine and in care of wife, all needs met.   Therapy Documentation Precautions:  Precautions Precautions: Fall Restrictions Weight Bearing Restrictions: No  See Function Navigator for Current Functional  Status.   Therapy/Group: Individual Therapy  Kenetra Hildenbrand K Arnette 11/03/2016, 2:25 PM

## 2016-11-03 NOTE — Progress Notes (Signed)
Subjective:  No chest pain or shortness of breath.  Ambulatory in hall  Objective:  Vital Signs in the last 24 hours: BP 129/81 (BP Location: Right Arm)   Pulse 67   Temp 98.3 F (36.8 C) (Oral)   Resp 18   Ht 6' (1.829 m)   Wt 81.2 kg (178 lb 15.7 oz)   SpO2 96%   BMI 24.27 kg/m   Physical Exam: Elderly male in no acute distress Lungs:  Clear Cardiac:  Irregular rhythm, normal S1 and S2, no S3, 1/6 systolic murmur Abdomen:  Soft, nontender, no masses Extremities:  No edema present  Intake/Output from previous day: 10/26 0701 - 10/27 0700 In: 480 [P.O.:480] Out: -   Weight Filed Weights   11/01/16 0539 11/02/16 0540 11/03/16 0500  Weight: 81.2 kg (179 lb) 81.2 kg (179 lb) 81.2 kg (178 lb 15.7 oz)   Assessment/Plan:  1.  Persistent atrial fibrillation rate controlled 2.  Previous right MCA stroke  3.  Long-term use of anticoagulation  Awaiting social work determination of payment for long-term anticoagulation.      Kerry Hough  MD [facility] Cardiology  11/03/2016, 1:47 PM

## 2016-11-03 NOTE — Progress Notes (Signed)
Subjective/Complaints: Up in hall with OT. No new issues. Asked me If I did his operation  ROS: pt denies nausea, vomiting, diarrhea, cough, shortness of breath or chest pain   Objective: Vital Signs: Blood pressure 129/81, pulse 67, temperature 98.3 F (36.8 C), temperature source Oral, resp. rate 18, height 6' (1.829 m), weight 81.2 kg (178 lb 15.7 oz), SpO2 96 %. No results found. Results for orders placed or performed during the hospital encounter of 10/29/16 (from the past 72 hour(s))  Glucose, capillary     Status: Abnormal   Collection Time: 10/31/16 11:42 AM  Result Value Ref Range   Glucose-Capillary 112 (H) 65 - 99 mg/dL  Glucose, capillary     Status: None   Collection Time: 10/31/16  4:49 PM  Result Value Ref Range   Glucose-Capillary 99 65 - 99 mg/dL  Glucose, capillary     Status: Abnormal   Collection Time: 10/31/16  8:37 PM  Result Value Ref Range   Glucose-Capillary 133 (H) 65 - 99 mg/dL  Glucose, capillary     Status: None   Collection Time: 11/01/16  6:45 AM  Result Value Ref Range   Glucose-Capillary 94 65 - 99 mg/dL  Glucose, capillary     Status: Abnormal   Collection Time: 11/01/16 12:12 PM  Result Value Ref Range   Glucose-Capillary 109 (H) 65 - 99 mg/dL  Glucose, capillary     Status: Abnormal   Collection Time: 11/01/16  4:22 PM  Result Value Ref Range   Glucose-Capillary 115 (H) 65 - 99 mg/dL  Glucose, capillary     Status: Abnormal   Collection Time: 11/01/16  9:39 PM  Result Value Ref Range   Glucose-Capillary 102 (H) 65 - 99 mg/dL  Glucose, capillary     Status: None   Collection Time: 11/02/16  6:37 AM  Result Value Ref Range   Glucose-Capillary 95 65 - 99 mg/dL  Glucose, capillary     Status: Abnormal   Collection Time: 11/02/16 12:07 PM  Result Value Ref Range   Glucose-Capillary 118 (H) 65 - 99 mg/dL  Glucose, capillary     Status: None   Collection Time: 11/02/16  4:43 PM  Result Value Ref Range   Glucose-Capillary 90 65 - 99  mg/dL  Glucose, capillary     Status: Abnormal   Collection Time: 11/02/16  8:44 PM  Result Value Ref Range   Glucose-Capillary 107 (H) 65 - 99 mg/dL  Glucose, capillary     Status: None   Collection Time: 11/03/16  6:11 AM  Result Value Ref Range   Glucose-Capillary 96 65 - 99 mg/dL     HEENT: Normocephalic. Atraumatic. Cardio: IRR IRR Resp: unlabored GI: Soft and ND Skin:   Intact and Wound Right groin cath site CDI Neuro: Alert/Oriented Motor: RUE/RLE: 5/5 proximal to distal LUE/LLE: 4+/5 proximal to distal (stable) Musc/Skel:  No edema. No tenderness. Gen NAD. Vital signs reviewed.   Assessment/Plan: 1. Functional deficits secondary to RIght MCA infarct which require 3+ hours per day of interdisciplinary therapy in a comprehensive inpatient rehab setting. Physiatrist is providing close team supervision and 24 hour management of active medical problems listed below. Physiatrist and rehab team continue to assess barriers to discharge/monitor patient progress toward functional and medical goals. FIM: Function - Bathing Position: Shower Body parts bathed by patient: Right arm, Left arm, Abdomen, Chest, Front perineal area, Buttocks, Left upper leg, Right upper leg, Right lower leg, Left lower leg Body parts bathed by helper: Back  Assist Level: Supervision or verbal cues  Function- Upper Body Dressing/Undressing What is the patient wearing?: Pull over shirt/dress Pull over shirt/dress - Perfomed by patient: Thread/unthread right sleeve, Thread/unthread left sleeve, Put head through opening, Pull shirt over trunk Assist Level: Touching or steadying assistance(Pt > 75%) Function - Lower Body Dressing/Undressing What is the patient wearing?: Pants, Socks, Shoes Position: Sitting EOB Pants- Performed by patient: Thread/unthread left pants leg, Thread/unthread right pants leg, Pull pants up/down Pants- Performed by helper: Pull pants up/down Non-skid slipper socks- Performed by  patient: Don/doff right sock, Don/doff left sock Socks - Performed by patient: Don/doff left sock, Don/doff right sock Shoes - Performed by patient: Don/doff right shoe, Don/doff left shoe, Fasten right, Fasten left Shoes - Performed by helper: Fasten right, Fasten left Assist for footwear: Supervision/touching assist Assist for lower body dressing: Supervision or verbal cues  Function - Toileting Toileting steps completed by patient: Adjust clothing prior to toileting, Performs perineal hygiene, Adjust clothing after toileting Toileting Assistive Devices: Grab bar or rail Assist level: Supervision or verbal cues  Function - Toilet Transfers Toilet transfer assistive device: Grab bar Assist level to toilet: Touching or steadying assistance (Pt > 75%) Assist level from toilet: Touching or steadying assistance (Pt > 75%)  Function - Chair/bed transfer Chair/bed transfer method: Ambulatory Chair/bed transfer assist level: Supervision or verbal cues Chair/bed transfer assistive device: Armrests  Function - Locomotion: Wheelchair Will patient use wheelchair at discharge?: No Function - Locomotion: Ambulation Assistive device: No device Max distance: 300' Assist level: Supervision or verbal cues Assist level: Supervision or verbal cues Assist level: Supervision or verbal cues Assist level: Supervision or verbal cues Assist level: Touching or steadying assistance (Pt > 75%)  Function - Comprehension Comprehension: Auditory Comprehension assist level: Follows basic conversation/direction with extra time/assistive device  Function - Expression Expression: Verbal Expression assist level: Expresses basic 90% of the time/requires cueing < 10% of the time.  Function - Social Interaction Social Interaction assist level: Interacts appropriately with others with medication or extra time (anti-anxiety, antidepressant).  Function - Problem Solving Problem solving assist level: Solves basic  90% of the time/requires cueing < 10% of the time  Function - Memory Memory assist level: Recognizes or recalls 75 - 89% of the time/requires cueing 10 - 24% of the time Patient normally able to recall (first 3 days only): Current season, That he or she is in a hospital  Medical Problem List and Plan: 1.  Left hemiparesis and dysphagia secondary to right MCA infarction  Continue CIR 2.  DVT Prophylaxis/Anticoagulation: Eliquis 3. Pain Management: Baclofen 5 mg 3 times a day for spasticity 4. Mood: Provide emotional support 5. Neuropsych: This patient is capable of making decisions on his own behalf. 6. Skin/Wound Care: Routine skin checks 7. Fluids/Electrolytes/Nutrition: Routine I&Os, BMET within acceptable range on 10/23 8. Atrial fibrillation/status post aortic valve replacement. Cardiology service follow-up. Atenolol reduced to 37.5 mg due to bouts of bradycardia. Vitals:   11/02/16 1500 11/03/16 0500  BP: 115/67 129/81  Pulse: 62 67  Resp: 18 18  Temp: 98.9 F (37.2 C) 98.3 F (36.8 C)  SpO2: 95% 96%  HR in acceptable rangeOn 10/27 9. Dysphagia. Dysphagia #3 her liquids. Follow-up speech therapy 10. Hypertension. Norvasc 10 mg daily  Controlled on 10/27 11. Hypokalemia: Resolved. Potassium 3.7 on 10/23 12. Borderline diabetes mellitus. Hemoglobin A1c 5.6. Patient diet controlled prior to admission. Continue sliding scale for now CBG (last 3)   Recent Labs  11/02/16 1643 11/02/16 2044  11/03/16 0611  GLUCAP 90 107* 96  Controlled On 10/27 13. BPH. Proscar 5 mg daily, Flomax 0.8 mg daily.   LOS (Days) 5 A FACE TO FACE EVALUATION WAS PERFORMED  Treven Holtman T 11/03/2016, 7:53 AM

## 2016-11-04 ENCOUNTER — Inpatient Hospital Stay (HOSPITAL_COMMUNITY): Payer: Medicare Other | Admitting: Occupational Therapy

## 2016-11-04 ENCOUNTER — Inpatient Hospital Stay (HOSPITAL_COMMUNITY): Payer: Medicare Other | Admitting: Speech Pathology

## 2016-11-04 LAB — GLUCOSE, CAPILLARY
Glucose-Capillary: 91 mg/dL (ref 65–99)
Glucose-Capillary: 122 mg/dL — ABNORMAL HIGH (ref 65–99)
Glucose-Capillary: 113 mg/dL — ABNORMAL HIGH (ref 65–99)
Glucose-Capillary: 94 mg/dL (ref 65–99)

## 2016-11-04 NOTE — Discharge Summary (Signed)
Discharge summary job # [id number]

## 2016-11-04 NOTE — Progress Notes (Signed)
Occupational Therapy Session Note  Patient Details  Name: JKAI ARWOOD MRN: [id number] Date of Birth: [birth year]  [name] Date: 11/04/2016 OT Individual Time: 1445-1531 OT Individual Time Calculation (min): 46 min    Short Term Goals: Week 1:  OT Short Term Goal 1 (Week 1): STG=LTG 2/2 ELOS  Skilled Therapeutic Interventions/Progress Updates:    Tx focus on dynamic balance, visual scanning, and standing endurance during IADL engagement.   Pt greeted supine in bed. Spouse present. He transitioned to EOB and donned shoes with setup. He ambulated without device to therapy apartment, retrieved vacuum cleaner and pushed it (with L UE) to dayroom with supervision. Pt vacuuming dayroom, interchanging between UEs for controlling vacuum and managing chords. Pt side stepping over chord and sliding furniture across carpet without LOB and supervision. 1 lateral LOB while backing up with vacuum with pt able to correct without assist. Pt required several seated rest breaks due to fatigue. Per pt, his activity tolerance deficit is the most limiting consequence of his CVA. Educated him/spouse on continuing engagement in meaningful IADL/leisure occupations to continue remediation of this deficit. Pt completed toilet transfer/toileting to public toilet with distant supervision. He ambulated to therapy apartment and put away vacuum cleaner, then to his room. Pt left supine in bed at time of departure. Spouse still present.   Therapy Documentation Precautions:  Precautions Precautions: Fall Restrictions Weight Bearing Restrictions: No Vital Signs: Therapy Vitals Temp: 98.1 F (36.7 C) Temp Source: Oral Pulse Rate: 96 Resp: 18 BP: 125/61 Patient Position (if appropriate): Lying Oxygen Therapy SpO2: 98 % O2 Device: Not Delivered Pain: No c/o pain during tx    ADL: ADL ADL Comments: Please see functional navigator :    See Function Navigator for Current Functional Status.   Therapy/Group:  Individual Therapy  Shakala Marlatt A Laporshia Hogen 11/04/2016, 4:02 PM

## 2016-11-04 NOTE — Progress Notes (Signed)
Subjective/Complaints: No new complaints. Had a quiet evening. Denies pain  ROS: pt denies nausea, vomiting, diarrhea, cough, shortness of breath or chest pain   Objective: Vital Signs: Blood pressure 130/83, pulse 67, temperature 97.9 F (36.6 C), temperature source Oral, resp. rate 17, height 6' (1.829 m), weight 79.5 kg (175 lb 4.8 oz), SpO2 94 %. No results found. Results for orders placed or performed during the hospital encounter of 10/29/16 (from the past 72 hour(s))  Glucose, capillary     Status: Abnormal   Collection Time: 11/01/16 12:12 PM  Result Value Ref Range   Glucose-Capillary 109 (H) 65 - 99 mg/dL  Glucose, capillary     Status: Abnormal   Collection Time: 11/01/16  4:22 PM  Result Value Ref Range   Glucose-Capillary 115 (H) 65 - 99 mg/dL  Glucose, capillary     Status: Abnormal   Collection Time: 11/01/16  9:39 PM  Result Value Ref Range   Glucose-Capillary 102 (H) 65 - 99 mg/dL  Glucose, capillary     Status: None   Collection Time: 11/02/16  6:37 AM  Result Value Ref Range   Glucose-Capillary 95 65 - 99 mg/dL  Glucose, capillary     Status: Abnormal   Collection Time: 11/02/16 12:07 PM  Result Value Ref Range   Glucose-Capillary 118 (H) 65 - 99 mg/dL  Glucose, capillary     Status: None   Collection Time: 11/02/16  4:43 PM  Result Value Ref Range   Glucose-Capillary 90 65 - 99 mg/dL  Glucose, capillary     Status: Abnormal   Collection Time: 11/02/16  8:44 PM  Result Value Ref Range   Glucose-Capillary 107 (H) 65 - 99 mg/dL  Glucose, capillary     Status: None   Collection Time: 11/03/16  6:11 AM  Result Value Ref Range   Glucose-Capillary 96 65 - 99 mg/dL  Glucose, capillary     Status: Abnormal   Collection Time: 11/03/16 12:04 PM  Result Value Ref Range   Glucose-Capillary 125 (H) 65 - 99 mg/dL  Glucose, capillary     Status: Abnormal   Collection Time: 11/03/16  5:13 PM  Result Value Ref Range   Glucose-Capillary 131 (H) 65 - 99 mg/dL   Glucose, capillary     Status: None   Collection Time: 11/03/16  8:44 PM  Result Value Ref Range   Glucose-Capillary 86 65 - 99 mg/dL  Glucose, capillary     Status: None   Collection Time: 11/04/16  6:28 AM  Result Value Ref Range   Glucose-Capillary 91 65 - 99 mg/dL     HEENT: Normocephalic. Atraumatic. Cardio: IRR IRR Resp: unlabored GI: Soft and ND Skin:   Intact and Wound Right groin cath site CDI Neuro: Alert/Oriented Motor: RUE/RLE: 5/5 proximal to distal LUE/LLE: 4+/5 proximal to distal (stable) Musc/Skel:  No edema. No tenderness. Gen NAD. Vital signs reviewed.   Assessment/Plan: 1. Functional deficits secondary to RIght MCA infarct which require 3+ hours per day of interdisciplinary therapy in a comprehensive inpatient rehab setting. Physiatrist is providing close team supervision and 24 hour management of active medical problems listed below. Physiatrist and rehab team continue to assess barriers to discharge/monitor patient progress toward functional and medical goals. FIM: Function - Bathing Position: Shower Body parts bathed by patient: Right arm, Left arm, Abdomen, Chest, Front perineal area, Buttocks, Left upper leg, Right upper leg, Right lower leg, Left lower leg Body parts bathed by helper: Back Assist Level: Supervision or verbal cues  Function- Upper Body Dressing/Undressing What is the patient wearing?: Pull over shirt/dress Pull over shirt/dress - Perfomed by patient: Thread/unthread right sleeve, Thread/unthread left sleeve, Put head through opening, Pull shirt over trunk Assist Level: Touching or steadying assistance(Pt > 75%) Function - Lower Body Dressing/Undressing What is the patient wearing?: Pants, Socks, Shoes Position: Sitting EOB Pants- Performed by patient: Thread/unthread left pants leg, Thread/unthread right pants leg, Pull pants up/down Pants- Performed by helper: Pull pants up/down Non-skid slipper socks- Performed by patient:  Don/doff right sock, Don/doff left sock Socks - Performed by patient: Don/doff left sock, Don/doff right sock Shoes - Performed by patient: Don/doff right shoe, Don/doff left shoe, Fasten right, Fasten left Shoes - Performed by helper: Fasten right, Fasten left Assist for footwear: Supervision/touching assist Assist for lower body dressing: Supervision or verbal cues  Function - Toileting Toileting steps completed by patient: Adjust clothing prior to toileting, Performs perineal hygiene, Adjust clothing after toileting Toileting Assistive Devices: Grab bar or rail Assist level: Supervision or verbal cues  Function - Toilet Transfers Toilet transfer assistive device: Grab bar Assist level to toilet: Touching or steadying assistance (Pt > 75%) Assist level from toilet: Touching or steadying assistance (Pt > 75%)  Function - Chair/bed transfer Chair/bed transfer method: Ambulatory Chair/bed transfer assist level: Supervision or verbal cues Chair/bed transfer assistive device: Armrests  Function - Locomotion: Wheelchair Will patient use wheelchair at discharge?: No Function - Locomotion: Ambulation Assistive device: No device Max distance: >500' Assist level: Supervision or verbal cues Assist level: Supervision or verbal cues Assist level: Supervision or verbal cues Assist level: Supervision or verbal cues Assist level: Touching or steadying assistance (Pt > 75%)  Function - Comprehension Comprehension: Auditory Comprehension assist level: Follows basic conversation/direction with extra time/assistive device  Function - Expression Expression: Verbal Expression assist level: Expresses basic 90% of the time/requires cueing < 10% of the time.  Function - Social Interaction Social Interaction assist level: Interacts appropriately with others with medication or extra time (anti-anxiety, antidepressant).  Function - Problem Solving Problem solving assist level: Solves basic 90% of  the time/requires cueing < 10% of the time  Function - Memory Memory assist level: Recognizes or recalls 75 - 89% of the time/requires cueing 10 - 24% of the time Patient normally able to recall (first 3 days only): Current season, That he or she is in a hospital  Medical Problem List and Plan: 1.  Left hemiparesis and dysphagia secondary to right MCA infarction  Continue CIR--ELOS 10/29 2.  DVT Prophylaxis/Anticoagulation: Eliquis 3. Pain Management: Baclofen 5 mg 3 times a day for spasticity 4. Mood: Provide emotional support 5. Neuropsych: This patient is capable of making decisions on his own behalf. 6. Skin/Wound Care: Routine skin checks 7. Fluids/Electrolytes/Nutrition: Routine I&Os, BMET within acceptable range on 10/23 8. Atrial fibrillation/status post aortic valve replacement. Cardiology service follow-up. Atenolol reduced to 37.5 mg due to bouts of bradycardia. Vitals:   11/03/16 1408 11/04/16 0534  BP: 113/61 130/83  Pulse: 62 67  Resp: 17 17  Temp: 98.5 F (36.9 C) 97.9 F (36.6 C)  SpO2: 98% 94%  HR in acceptable rangeOn 10/28 9. Dysphagia. Dysphagia #3 her liquids. Follow-up speech therapy 10. Hypertension. Norvasc 10 mg daily  Controlled on 10/28 11. Hypokalemia: Resolved. Potassium 3.7 on 10/23 12. Borderline diabetes mellitus. Hemoglobin A1c 5.6. Patient diet controlled prior to admission. Continue sliding scale for now CBG (last 3)   Recent Labs  11/03/16 1713 11/03/16 2044 11/04/16 0628  GLUCAP 131* 86  91  Controlled On 10/28 13. BPH. Proscar 5 mg daily, Flomax 0.8 mg daily.   LOS (Days) 6 A FACE TO FACE EVALUATION WAS PERFORMED  Teresha Hanks T 11/04/2016, 8:02 AM

## 2016-11-04 NOTE — Discharge Summary (Signed)
NAMEKENDRE, JACINTO NO.:  [id number]  MEDICAL RECORD NO.:  [id number]  LOCATION:  4M01C                        FACILITY:  [facility]  PHYSICIAN:  Charlett Blake, M.D.DATE OF BIRTH:  May 15, 1935  DATE OF ADMISSION:  10/29/2016 DATE OF DISCHARGE:  11/05/2016                              DISCHARGE SUMMARY   DISCHARGE DIAGNOSES: 1. Right middle cerebral artery infarction. 2. Pain management. 3. Atrial fibrillation with aortic valve replacement. 4. Dysphagia. 5. Hypertension. 6. Hypokalemia. 7. Borderline diabetes mellitus.  This is a 81 year old right-handed male, history of borderline diabetes mellitus, CAD with aortic valve replacement in 2012.  Lives with spouse, independent prior to admission.  Presented on October 24, 2016, with altered mental status and aphasia with right gaze deviation.  CT of the head showed right basal ganglia infarction.  MRI, acute infarction, primarily right basal ganglia.  Acute infarction also in the insula on the right and scattered tiny areas of acute infarction of the cerebral cortex bilaterally, right greater than left.  CT angio of the head and neck showed right M1 occlusion extending into both superior and inferior M2 branches.  No evidence of craniocervical dissection.  Underwent revascularization per Interventional Radiology.  EKG showed atrial fibrillation.  Patient asymptomatic.  Cardiology service consulted. Noted bouts of bradycardia in the 30s, with Tenormin reduced to 37.5 mg. echocardiogram with ejection fraction of 60%.  No wall motion abnormalities.  Neurology consulted.  Eliquis was initiated for CVA prophylaxis.  Dysphagia #3 nectar thick liquid diet.  Physical and occupational therapy ongoing.  The patient was admitted for comprehensive rehab program.  PAST MEDICAL HISTORY:  See discharge diagnoses.  SOCIAL HISTORY:  Lives with spouse, independent prior to admission.  FUNCTIONAL STATUS UPON ADMISSION TO  REHAB SERVICES:  Minimal guard 110 feet rolling walker, minimal guard supervision sit to stand, min to mod assist activities of daily living.  PHYSICAL EXAMINATION:  VITAL SIGNS:  Blood pressure 142/91, pulse 74, temperature 98, and respirations 18. GENERAL:  Alert male, in no acute distress.  Oriented to person, place, and time.  Pupils round and reactive to light. CARDIAC:  Rate controlled. ABDOMEN:  Soft and nontender.  Good bowel sounds. LUNGS:  Clear to auscultation without wheeze.  Speech was mildly dysarthric, but intelligible.  Fair awareness of deficits.  REHABILITATION HOSPITAL COURSE:  The patient was admitted to Inpatient Rehab Services with therapies initiated on a 3-hour daily basis, consisting of physical therapy, occupational therapy, speech therapy, and rehabilitation nursing.  The following issues were addressed during the patient's rehabilitation stay.  Pertaining to Mr. Creech right MCA infarction, remained stable.  He continued on Eliquis and planned to follow up Neurology Services.  Heart rate controlled.  Cardiology service followup.  His atenolol had been reduced to 25 mg due to bouts of bradycardia.  Tolerating a mechanical soft thin liquid diet.  Blood pressures monitored with Norvasc and Tenormin.  He did have some BPH. He continued on Proscar as well as Flomax.  Borderline diabetes mellitus.  Hemoglobin A1c of 5.6.  Glucose level is 96-131.  The patient received weekly collaborative interdisciplinary team conferences to discuss estimated length of stay, family teaching, any barriers to discharge.  Working  on endurance and activity tolerance, transfers to edge of bed, put on shoes with supervision.  Ambulates to and from the day room with close supervision without assistive device.  Encouraged the patient to perform all activities such as pulling at a chair to sit or cleaning up with assistance while standing.  He could gather his belongings for  activities of daily living and homemaking.  He was able to communicate his needs without problems and plan discharge to home.  DISCHARGE MEDICATIONS: 1. Norvasc 10 mg p.o. daily. 2. Eliquis 5 mg p.o. b.i.d. 3. Tenormin 25 mg p.o. daily. 4. Proscar 5 mg p.o. daily. 5. Flomax 0.8 mg daily.  DIET:  Mechanical soft.  FOLLOWUP:  He would follow up with Dr. Alysia Penna at the [facility] as directed, Dr. Erlinda Hong, Neurology Services, call for appointment in 6 weeks; Dr. Candee Furbish, Cardiology Services; Leighton Ruff, medical Management.  SPECIAL INSTRUCTIONS:  No driving.     Lauraine Rinne, P.A.   ______________________________ Charlett Blake, M.D.    DA/MEDQ  D:  11/04/2016  T:  11/04/2016  Job:  [id number]  cc:   Dr. Ethel Rana, M.D. Jerline Pain, MD Dr. Leighton Ruff

## 2016-11-05 ENCOUNTER — Inpatient Hospital Stay (HOSPITAL_COMMUNITY): Payer: Medicare Other | Admitting: Physical Therapy

## 2016-11-05 ENCOUNTER — Inpatient Hospital Stay (HOSPITAL_COMMUNITY): Payer: Medicare Other | Admitting: Occupational Therapy

## 2016-11-05 ENCOUNTER — Inpatient Hospital Stay (HOSPITAL_COMMUNITY): Payer: Medicare Other | Admitting: Speech Pathology

## 2016-11-05 LAB — GLUCOSE, CAPILLARY: Glucose-Capillary: 94 mg/dL (ref 65–99)

## 2016-11-05 MED ORDER — FINASTERIDE 5 MG PO TABS: 5.0000 mg | ORAL_TABLET | Freq: Every day | ORAL | 0 refills | Status: AC

## 2016-11-05 MED ORDER — TAMSULOSIN HCL 0.4 MG PO CAPS: 0.8000 mg | ORAL_CAPSULE | Freq: Every day | ORAL | 0 refills | Status: DC

## 2016-11-05 MED ORDER — ATENOLOL 25 MG PO TABS: 25.0000 mg | ORAL_TABLET | Freq: Every day | ORAL | 0 refills | Status: DC

## 2016-11-05 MED ORDER — AMLODIPINE BESYLATE 10 MG PO TABS: 10.0000 mg | ORAL_TABLET | Freq: Every day | ORAL | 0 refills | Status: DC

## 2016-11-05 MED ORDER — APIXABAN 5 MG PO TABS: 5.0000 mg | ORAL_TABLET | Freq: Two times a day (BID) | ORAL | 1 refills | Status: DC

## 2016-11-05 NOTE — Progress Notes (Signed)
Patient discharged to home, accompanied by his wife. 

## 2016-11-05 NOTE — Progress Notes (Signed)
Social Work  Discharge Note  The overall goal for the admission was met for:   Discharge location: Yes-HOME WITH WIFE WHO CAN PROVIDE 24 HR SUPERVISION LEVEL  Length of Stay: Yes-7 DAYS  Discharge activity level: Yes-SUPERVISION LEVEL  Home/community participation: Yes  Services provided included: MD, RD, PT, OT, SLP, RN, CM, TR, Pharmacy and SW  Financial Services: Private Insurance: [facility]  Follow-up services arranged: Outpatient: [facility]OT.SP-11/2 10:00-11:00 & 11/7 12:30-2:00 PM, DME: [facility] and Patient/Family has no preference for HH/DME agencies  Comments (or additional information):  Patient/Family verbalized understanding of follow-up arrangements: Yes  Individual responsible for coordination of the follow-up plan: ROBERTA-WIFE  Confirmed correct DME delivered: Elease Hashimoto 11/05/2016    Elease Hashimoto

## 2016-11-05 NOTE — Progress Notes (Signed)
Subjective/Complaints:   ROS: pt denies nausea, vomiting, diarrhea, cough, shortness of breath or chest pain   Objective: Vital Signs: Blood pressure 131/76, pulse (!) 52, temperature 97.9 F (36.6 C), temperature source Oral, resp. rate 18, height 6' (1.829 m), weight 78 kg (172 lb), SpO2 98 %. No results found. Results for orders placed or performed during the hospital encounter of 10/29/16 (from the past 72 hour(s))  Glucose, capillary     Status: Abnormal   Collection Time: 11/02/16 12:07 PM  Result Value Ref Range   Glucose-Capillary 118 (H) 65 - 99 mg/dL  Glucose, capillary     Status: None   Collection Time: 11/02/16  4:43 PM  Result Value Ref Range   Glucose-Capillary 90 65 - 99 mg/dL  Glucose, capillary     Status: Abnormal   Collection Time: 11/02/16  8:44 PM  Result Value Ref Range   Glucose-Capillary 107 (H) 65 - 99 mg/dL  Glucose, capillary     Status: None   Collection Time: 11/03/16  6:11 AM  Result Value Ref Range   Glucose-Capillary 96 65 - 99 mg/dL  Glucose, capillary     Status: Abnormal   Collection Time: 11/03/16 12:04 PM  Result Value Ref Range   Glucose-Capillary 125 (H) 65 - 99 mg/dL  Glucose, capillary     Status: Abnormal   Collection Time: 11/03/16  5:13 PM  Result Value Ref Range   Glucose-Capillary 131 (H) 65 - 99 mg/dL  Glucose, capillary     Status: None   Collection Time: 11/03/16  8:44 PM  Result Value Ref Range   Glucose-Capillary 86 65 - 99 mg/dL  Glucose, capillary     Status: None   Collection Time: 11/04/16  6:28 AM  Result Value Ref Range   Glucose-Capillary 91 65 - 99 mg/dL  Glucose, capillary     Status: Abnormal   Collection Time: 11/04/16 11:25 AM  Result Value Ref Range   Glucose-Capillary 122 (H) 65 - 99 mg/dL  Glucose, capillary     Status: Abnormal   Collection Time: 11/04/16  4:46 PM  Result Value Ref Range   Glucose-Capillary 113 (H) 65 - 99 mg/dL  Glucose, capillary     Status: None   Collection Time: 11/04/16   9:59 PM  Result Value Ref Range   Glucose-Capillary 94 65 - 99 mg/dL  Glucose, capillary     Status: None   Collection Time: 11/05/16  6:38 AM  Result Value Ref Range   Glucose-Capillary 94 65 - 99 mg/dL     HEENT: Normocephalic. Atraumatic. Cardio: IRR IRR Resp: unlabored GI: Soft and ND Skin:   Intact and Wound Right groin cath site CDI Neuro: Alert/Oriented Motor: RUE/RLE: 5/5 proximal to distal LUE/LLE: 4+/5 proximal to distal (stable) Musc/Skel:  No edema. No tenderness. Gen NAD. Vital signs reviewed.   Assessment/Plan: 1. Functional deficits secondary to RIght MCA infarct  Stable for D/C today F/u PCP in 3-4 weeks F/u PM&R 2 weeks See D/C summary See D/C instructions No Driving until rehab f/u FIM: Function - Bathing Position: Shower Body parts bathed by patient: Right arm, Left arm, Abdomen, Chest, Front perineal area, Buttocks, Left upper leg, Right upper leg, Right lower leg, Left lower leg Body parts bathed by helper: Back Assist Level: Supervision or verbal cues  Function- Upper Body Dressing/Undressing What is the patient wearing?: Pull over shirt/dress Pull over shirt/dress - Perfomed by patient: Thread/unthread right sleeve, Thread/unthread left sleeve, Put head through opening, Pull shirt over  trunk Assist Level: Touching or steadying assistance(Pt > 75%) Function - Lower Body Dressing/Undressing What is the patient wearing?: Pants, Socks, Shoes Position: Sitting EOB Pants- Performed by patient: Thread/unthread left pants leg, Thread/unthread right pants leg, Pull pants up/down Pants- Performed by helper: Pull pants up/down Non-skid slipper socks- Performed by patient: Don/doff right sock, Don/doff left sock Socks - Performed by patient: Don/doff left sock, Don/doff right sock Shoes - Performed by patient: Don/doff right shoe, Don/doff left shoe, Fasten right, Fasten left Shoes - Performed by helper: Fasten right, Fasten left Assist for footwear:  Supervision/touching assist Assist for lower body dressing: Supervision or verbal cues  Function - Toileting Toileting steps completed by patient: Adjust clothing prior to toileting, Performs perineal hygiene, Adjust clothing after toileting Toileting Assistive Devices: Grab bar or rail Assist level: Supervision or verbal cues  Function - Toilet Transfers Toilet transfer assistive device: Grab bar Assist level to toilet: Supervision or verbal cues Assist level from toilet: Supervision or verbal cues  Function - Chair/bed transfer Chair/bed transfer method: Ambulatory Chair/bed transfer assist level: Supervision or verbal cues Chair/bed transfer assistive device: Armrests  Function - Locomotion: Wheelchair Will patient use wheelchair at discharge?: No Function - Locomotion: Ambulation Assistive device: No device Max distance: >500' Assist level: Supervision or verbal cues Assist level: Supervision or verbal cues Assist level: Supervision or verbal cues Assist level: Supervision or verbal cues Assist level: Touching or steadying assistance (Pt > 75%)  Function - Comprehension Comprehension: Auditory Comprehension assist level: Follows basic conversation/direction with extra time/assistive device  Function - Expression Expression: Verbal Expression assist level: Expresses basic 90% of the time/requires cueing < 10% of the time.  Function - Social Interaction Social Interaction assist level: Interacts appropriately with others with medication or extra time (anti-anxiety, antidepressant).  Function - Problem Solving Problem solving assist level: Solves basic 90% of the time/requires cueing < 10% of the time  Function - Memory Memory assist level: Recognizes or recalls 75 - 89% of the time/requires cueing 10 - 24% of the time Patient normally able to recall (first 3 days only): Current season, That he or she is in a hospital, Location of own room, Staff names and  faces  Medical Problem List and Plan: 1.  Left hemiparesis and dysphagia secondary to right MCA infarction  Continue CIR--D/C today 2.  DVT Prophylaxis/Anticoagulation: Eliquis 3. Pain Management: Baclofen 5 mg 3 times a day for spasticity 4. Mood: Provide emotional support 5. Neuropsych: This patient is capable of making decisions on his own behalf. 6. Skin/Wound Care: Routine skin checks 7. Fluids/Electrolytes/Nutrition: Routine I&Os, BMET within acceptable range on 10/23 8. Atrial fibrillation/status post aortic valve replacement. Cardiology service follow-up. Atenolol reduced to 37.5 mg due to bouts of bradycardia. Vitals:   11/04/16 1444 11/05/16 0540  BP: 125/61 131/76  Pulse: 96 (!) 52  Resp: 18 18  Temp: 98.1 F (36.7 C) 97.9 F (36.6 C)  SpO2: 98% 98%  HR in acceptable range On 10/29 9. Dysphagia. Dysphagia #3 her liquids. Follow-up speech therapy 10. Hypertension. Norvasc 10 mg daily  Controlled on 10/29 11. Hypokalemia: Resolved. Potassium 3.7 on 10/23 12. Borderline diabetes mellitus. Hemoglobin A1c 5.6. Patient diet controlled prior to admission. Continue sliding scale for now CBG (last 3)   Recent Labs  11/04/16 1646 11/04/16 2159 11/05/16 0638  GLUCAP 113* 94 94  Controlled On 10/29 13. BPH. Proscar 5 mg daily, Flomax 0.8 mg daily.   LOS (Days) 7 A FACE TO FACE EVALUATION WAS PERFORMED  Alysia Penna E 11/05/2016, 7:20 AM

## 2016-11-05 NOTE — Progress Notes (Signed)
Physical Therapy Discharge Summary  Patient Details  Name: [name] MRN: [id number] Date of Birth: 1935/01/14  [name] Date: 11/05/2016 PT Individual Time: 0910-0955 PT Individual Time Calculation (min): 45 min    Patient has met 9 of 9 long term goals due to improved activity tolerance, improved balance, improved postural control, ability to compensate for deficits, improved attention, improved awareness and improved coordination.  Patient to discharge at an ambulatory level Modified Independent.     Recommendation:  Patient will benefit from ongoing skilled PT services in outpatient setting to continue to advance safe functional mobility, address ongoing impairments in balance, safety awareness, and coordination, and minimize fall risk.  Equipment: No equipment provided  Reasons for discharge: treatment goals met  Patient/family agrees with progress made and goals achieved: Yes   Skilled PT Intervention: No c/o pain.  Session focus on d/c assessment, balance assessment, and pt education for d/c and falls recovery.    Pt completes all mobility at mod I level including transfers, ambulation, and stair negotiation.  PT instructs pt in BERG Balance Scale (see below, 51/56), TUG (9.95 seconds), and 5xSS (14.02 seconds without UE support) and interprets results and falls risk for patient and wife.  PT instructs pt in falls recovery and pt completes floor transfer mod I.  Returned to room at end of session, call bell in reach and needs met.   PT Discharge Precautions/Restrictions Precautions Precautions: Fall Precaution Comments: occasionally with shuffling steps Pain Pain Assessment Pain Assessment: No/denies pain Vision/Perception  Perception Perception: Within Functional Limits Praxis Praxis: Intact  Cognition Overall Cognitive Status: Impaired/Different from baseline Arousal/Alertness: Awake/alert Orientation Level: Oriented X4 Attention: Selective Sustained  Attention: Appears intact Selective Attention: Appears intact Memory: Appears intact Awareness: Appears intact Problem Solving: Impaired Problem Solving Impairment: Functional complex Safety/Judgment: Impaired Sensation Sensation Light Touch: Appears Intact (intact LEs, mild neuropathy at baseline in B feet) Coordination Gross Motor Movements are Fluid and Coordinated: Yes Fine Motor Movements are Fluid and Coordinated: Yes (WFL) Coordination and Movement Description: mild inccordination on L Finger Nose Finger Test: very mild dysmetria on L Heel Shin Test: normal Motor  Motor Motor: Within Functional Limits Motor - Discharge Observations: [facility]  Mobility Bed Mobility Bed Mobility: Supine to Sit;Sit to Supine Supine to Sit: 7: Independent Sit to Supine: 7: Independent Transfers Transfers: Yes Sit to Stand: 6: Modified independent (Device/Increase time) Stand Pivot Transfers: 6: Modified independent (Device/Increase time) Locomotion  Ambulation Ambulation: Yes Ambulation/Gait Assistance: 6: Modified independent (Device/Increase time) Ambulation Distance (Feet): 150 Feet Assistive device: None Gait Gait: Yes Gait Pattern: Impaired Gait Pattern: Shuffle Gait velocity: occasional shuffling Stairs / Additional Locomotion Stairs: Yes Stairs Assistance: 5: Supervision Stair Management Technique: Two rails;One rail Right Number of Stairs: 12 Ramp: 6: Modified independent (Device) Curb: 6: Modified independent (Device/increase time) Wheelchair Mobility Wheelchair Mobility: No  Trunk/Postural Assessment  Cervical Assessment Cervical Assessment: Within Functional Limits Thoracic Assessment Thoracic Assessment: Within Functional Limits Lumbar Assessment Lumbar Assessment: Within Functional Limits Postural Control Postural Control: Within Functional Limits  Balance Balance Balance Assessed: Yes Standardized Balance Assessment Standardized Balance Assessment: Berg  Balance Test Berg Balance Test Sit to Stand: Able to stand without using hands and stabilize independently Standing Unsupported: Able to stand safely 2 minutes Sitting with Back Unsupported but Feet Supported on Floor or Stool: Able to sit safely and securely 2 minutes Stand to Sit: Sits safely with minimal use of hands Transfers: Able to transfer safely, minor use of hands Standing Unsupported with Eyes Closed: Able  to stand 10 seconds with supervision Standing Ubsupported with Feet Together: Able to place feet together independently and stand 1 minute safely From Standing, Reach Forward with Outstretched Arm: Can reach confidently >25 cm (10") From Standing Position, Pick up Object from Floor: Able to pick up shoe safely and easily From Standing Position, Turn to Look Behind Over each Shoulder: Looks behind from both sides and weight shifts well Turn 360 Degrees: Able to turn 360 degrees safely one side only in 4 seconds or less Standing Unsupported, Alternately Place Feet on Step/Stool: Able to stand independently and safely and complete 8 steps in 20 seconds Standing Unsupported, One Foot in Front: Able to plae foot ahead of the other independently and hold 30 seconds Standing on One Leg: Able to lift leg independently and hold equal to or more than 3 seconds Total Score: 51 Timed Up and Go Test TUG: Normal TUG Normal TUG (seconds): 9.95 Static Sitting Balance Static Sitting - Balance Support: Feet supported;No upper extremity supported Static Sitting - Level of Assistance: 6: Modified independent (Device/Increase time) Static Standing Balance Static Standing - Balance Support: During functional activity Static Standing - Level of Assistance: 6: Modified independent (Device/Increase time) Dynamic Standing Balance Dynamic Standing - Balance Support: During functional activity Dynamic Standing - Level of Assistance: 6: Modified independent (Device/Increase time) Extremity Assessment       RLE Assessment RLE Assessment: Within Functional Limits RLE Strength Right Hip Flexion: 4+/5 Right Knee Flexion: 5/5 Right Knee Extension: 5/5 Right Ankle Dorsiflexion: 3+/5 Right Ankle Plantar Flexion: 3+/5 LLE Assessment LLE Assessment: Exceptions to [facility] LLE Strength Left Hip Flexion: 4/5 Left Knee Flexion: 4+/5 Left Knee Extension: 4+/5 Left Ankle Dorsiflexion: 3+/5 Left Ankle Plantar Flexion: 3+/5   See Function Navigator for Current Functional Status.  Michel Santee 11/05/2016, 9:59 AM

## 2016-11-05 NOTE — Progress Notes (Signed)
Progress Note  Patient Name: [name] Date of Encounter: 11/05/2016  Primary Cardiologist:   Dr. Marlou Porch  Subjective   No pain.  No SOB.   Inpatient Medications    Scheduled Meds: . amLODipine  10 mg Oral Daily  . apixaban  5 mg Oral BID  . atenolol  25 mg Oral Daily  . finasteride  5 mg Oral Daily  . insulin aspart  0-9 Units Subcutaneous TID AC & HS  . tamsulosin  0.8 mg Oral QPC breakfast   Continuous Infusions:  PRN Meds: acetaminophen, bisacodyl, ondansetron **OR** ondansetron (ZOFRAN) IV, senna-docusate, sorbitol   Vital Signs    Vitals:   11/03/16 1408 11/04/16 0534 11/04/16 1444 11/05/16 0540  BP: 113/61 130/83 125/61 131/76  Pulse: 62 67 96 (!) 52  Resp: 17 17 18 18   Temp: 98.5 F (36.9 C) 97.9 F (36.6 C) 98.1 F (36.7 C) 97.9 F (36.6 C)  TempSrc: Oral Oral Oral Oral  SpO2: 98% 94% 98% 98%  Weight:  175 lb 4.8 oz (79.5 kg)  172 lb (78 kg)  Height:        Intake/Output Summary (Last 24 hours) at 11/05/16 0951 Last data filed at 11/05/16 0900  Gross per 24 hour  Intake              580 ml  Output                0 ml  Net              580 ml   Filed Weights   11/03/16 0500 11/04/16 0534 11/05/16 0540  Weight: 178 lb 15.7 oz (81.2 kg) 175 lb 4.8 oz (79.5 kg) 172 lb (78 kg)    Telemetry    NA - Personally Reviewed  ECG    NA - Personally Reviewed  Physical Exam   GEN: No acute distress.   Neck: No  JVD Cardiac: RRR, no murmurs, rubs, or gallops.  Respiratory: Clear  to auscultation bilaterally. GI: Soft, nontender, non-distended  MS: No  edema; No deformity. Psych: Normal affect   Labs    Chemistry Recent Labs Lab 10/30/16 0536  NA 140  K 3.7  CL 108  CO2 26  GLUCOSE 103*  BUN 8  CREATININE 0.73  CALCIUM 8.8*  PROT 6.3*  ALBUMIN 3.4*  AST 27  ALT 16*  ALKPHOS 55  BILITOT 1.1  GFRNONAA >60  GFRAA >60  ANIONGAP 6     Hematology Recent Labs Lab 10/30/16 0536  WBC 5.9  RBC 3.53*  HGB 11.2*  HCT  33.6*  MCV 95.2  MCH 31.7  MCHC 33.3  RDW 13.5  PLT 127*    Cardiac EnzymesNo results for input(s): TROPONINI in the last 168 hours. No results for input(s): TROPIPOC in the last 168 hours.   BNPNo results for input(s): BNP, PROBNP in the last 168 hours.   DDimer No results for input(s): DDIMER in the last 168 hours.   Radiology    No results found.  Cardiac Studies   ECHO 10/27/2016 - Left ventricle: The cavity size was normal. Wall thickness wasincreased in a pattern of moderate LVH. There was mild focalbasal hypertrophy of the septum. Systolic function was normal.The estimated ejection fraction was in the range of 55% to 60%.Wall motion was normal; there were no regional wall motionabnormalities. - Aortic valve: A bioprosthesis was present. Valve area (VTI): 1.62cm^2. Valve area (Vmax): 1.34 cm^2. Valve area (Vmean): 1.39cm^2. - Aortic root:  The aortic root was mildly dilated. - Mitral valve: There was moderate regurgitation. - Left atrium: The atrium was severely dilated. - Tricuspid valve: There was severe regurgitation. - Pulmonary arteries: Systolic pressure was moderately increased. PA peak pressure: 51 mm Hg (S). - Line: A venous catheter was visualized in the right atrium.  Impressions:  - Normal LV systolic function; moderate LVH; s/p AVR with mean gradient 14 mmHg; mildly dilated aortic root; moderate MR; severe LAE; severe TR with moderately elevated.  Patient Profile     81 y.o. 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  Assessment & Plan    ATRIAL FIB:  On Eliquis.  Continue current therapy.  Off ASA.   AVR:  Stable as above.   HTN:  BP controlled.      Signed, Minus Breeding, MD  11/05/2016, 9:51 AM

## 2016-11-05 NOTE — Progress Notes (Signed)
Occupational Therapy Session Note  Patient Details  Name: [name] MRN: [id number] Date of Birth: [birth year]  [name] Date: 11/05/2016 OT Individual Time: 1030-1110 OT Individual Time Calculation (min): 40 min    Skilled Therapeutic Interventions/Progress Updates: Patient was anxious to leave and asked for as abbreviated session as possible.   He utilized 40 of his 60 minutes session and requested to finish up early for discharge.   Overall he and wife completed transfer training and repracticed lower body dressing with overall distant S.    Patient was left in room with call bell within reach and conversing with his nurse.     Therapy Documentation Precautions:  Precautions Precautions: Fall Precaution Comments: occasionally with shuffling steps Restrictions Weight Bearing Restrictions: No General: General OT Amount of Missed Time: 20 Minutes Pain: Pain Assessment Pain Assessment: No/denies pain Vision Baseline Vision/History: Wears glasses Wears Glasses:  (just for driving) Patient Visual Report: No change from baseline Vision Assessment?: No apparent visual deficits   Therapy/Group: Individual Therapy  Alfredia Ferguson [facility] 11/05/2016, 12:52 PM

## 2016-11-05 NOTE — Progress Notes (Signed)
Speech Language Pathology Session Note & Discharge Summary  Patient Details  Name: [name] MRN: [id number] Date of Birth: 03-26-1935  [name] Date: 11/05/2016 SLP Individual Time: 0715-0810 SLP Individual Time Calculation (min): 55 min   Skilled Therapeutic Interventions:  Skilled treatment session focused on dysphagia and cognitive goals. SLP facilitated session by providing intermittent supervision verbal cues for use of a complete head turn to the left during breakfast meal of Dys. 3 textures with thin liquids which led to intermittent subtle throat clearing. Recommend patient continue current diet with supervision from family to maximize utilization of swallowing strategies. SLP also re-administered the MoCA (version 7.3) in which the patient scored 27/30 points with a score of 26 or above considered normal. Patient demonstrated improvements since initial evaluation on 10/23 in which he scored 23/30 points. However, patient continues to demonstrate mild deficits in complex problem solving. Patient's wife present throughout session and educated on current swallowing function, diet recommendations, appropriate textures, swallowing compensatory strategies, current cognitive deficits and strategies to utilize at home to maximize overall safety. She verbalized understanding of all information and handouts were also given to reinforce information. Patient left upright in bed with wife present. Continue with current plan of care.    Patient has met 4 of 5 long term goals.  Patient to discharge at overall Supervision;Modified Independent level.   Reasons goals not met: Patient continues to require supervision verbal cues for complex problem solvin    Clinical Impression/Discharge Summary: Patient has made excellent gains and has met 4 of 5 LTG's this admission. Currently, patient is consuming Dys. 3 textures with thin liquids with intermittent subtle throat clearing and Mod I-Supervision for  use of swallowing compensatory strategies which include a head turn to the left with all solids/liquids. Intermittent throat clearing is resolved with use of a complete head turn to the left. Patient also requires overall Mod I-supervision to complete functional and familiar tasks safely in regards to problem solving, recall and attention. Patient and family education is complete and patient will discharge home with supervision from family. Patient would benefit from f/u outpatient SLP services to maximize his cognitive and swallowing function and overall functional independence in order to reduce caregiver burden.   Care Partner:  Caregiver Able to Provide Assistance: Yes  Type of Caregiver Assistance: Physical;Cognitive  Recommendation:  24 hour supervision/assistance;Outpatient SLP  Rationale for SLP Follow Up: Maximize cognitive function and independence;Maximize swallowing safety;Reduce caregiver burden   Equipment: N/A   Reasons for discharge: Treatment goals met;Discharged from hospital   Patient/Family Agrees with Progress Made and Goals Achieved: Yes   Function:  Eating Eating   Modified Consistency Diet: Yes Eating Assist Level: Supervision or verbal cues           Cognition Comprehension Comprehension assist level: Follows basic conversation/direction with extra time/assistive device  Expression   Expression assist level: Expresses basic 90% of the time/requires cueing < 10% of the time.  Social Interaction Social Interaction assist level: Interacts appropriately with others with medication or extra time (anti-anxiety, antidepressant).  Problem Solving Problem solving assist level: Solves complex 90% of the time/cues < 10% of the time  Memory Memory assist level: More than reasonable amount of time   [name] 11/05/2016, 8:14 AM

## 2016-11-06 NOTE — Progress Notes (Signed)
Occupational Therapy Discharge Summary  Patient Details  Name: [name] MRN: [id number] Date of Birth: 10/18/1935   Patient has met 9 of 9 long term goals due to improved activity tolerance, improved balance, functional use of  LEFT upper and LEFT lower extremity, improved attention, improved awareness and improved coordination.  Patient to discharge at overall Modified Independent level.  Patient's care partner is independent to provide the necessary cognitive assistance at discharge.    Reasons goals not met: n/a  Recommendation:  Patient will benefit from ongoing skilled OT services in outpatient setting to continue to advance functional skills in the area of BADL and iADL, improve awareness, coordination, and minimize fall risk.  Equipment: No equipment provided  Reasons for discharge: treatment goals met and discharge from hospital  Patient/family agrees with progress made and goals achieved: Yes  OT Discharge Precautions/Restrictions  Precautions Precautions: Fall General   Vital Signs  Pain   ADL ADL ADL Comments: Please see functional navigator Vision Baseline Vision/History: Wears glasses Patient Visual Report: No change from baseline Vision Assessment?: No apparent visual deficits Perception  Perception: Within Functional Limits Praxis Praxis: Intact Cognition Overall Cognitive Status: Impaired/Different from baseline Orientation Level: Oriented X4 Attention: Selective Sustained Attention: Appears intact Selective Attention: Appears intact Memory: Appears intact Sensation Sensation Light Touch: Appears Intact Motor  Motor Motor: Within Functional Limits Mobility  Transfers Sit to Stand: 6: Modified independent (Device/Increase time)  Trunk/Postural Assessment  Cervical Assessment Cervical Assessment: Within Functional Limits Thoracic Assessment Thoracic Assessment: Within Functional Limits Lumbar Assessment Lumbar Assessment: Within  Functional Limits Postural Control Postural Control: Within Functional Limits  Balance Balance Balance Assessed: Yes Dynamic Standing Balance Dynamic Standing - Level of Assistance: 6: Modified independent (Device/Increase time) Extremity/Trunk Assessment RUE Assessment RUE Assessment: Within Functional Limits LUE Assessment LUE Assessment: Within Functional Limits   See Function Navigator for Current Functional Status.  Stephanie M Schlosser 11/06/2016, 10:33 AM 

## 2016-11-07 ENCOUNTER — Other Ambulatory Visit: Payer: Self-pay

## 2016-11-07 ENCOUNTER — Ambulatory Visit: Payer: Medicare Other | Admitting: Nurse Practitioner

## 2016-11-07 NOTE — Patient Outreach (Signed)
[facility]) Care Management  11/07/2016  Elson Ulbrich Kissler 1935-05-15 [id number]     EMMI-STROKE RED ON EMMI ALERT Day # 1 Date: 11/06/16 Red Alert Reason: "Feeling worse overall? Yes   New problems walking/talking/speaking/seeing? Yes"   Outreach attempt # 1 to patient. Spoke with patient. He voices he is recovering and doing well. Reviewed and addressed red alerts with patient. He voices that he responded no to questions but machine misunderstood responses. He denies any issues and things have been going well for him since return home. He has several MD f/u appts in place. Patient states he has supportive spouse who will take him to appts and assist with any of his care needs. He has all his meds and voices knowing how,when and why to take them. Patient reports he lost 10 lbs in the hospital and is hopeful that he will be able to gain some weight back now that he is home and feeling better. RN CM reviewed with patient s/s of stroke and when to seek medical attention. Patient voiced understanding. He denies any further RN CM needs or concerns at this time. Advised patient that they would continue to get automated EMMI-Stroke post discharge calls to assess how they are doing following recent hospitalization and will receive a call from a nurse if any of their responses were abnormal. Patient voiced understanding and was appreciative of f/u call.       Plan: RN CM will notify [facility] adminstrative assistant of case status.   Enzo Montgomery, RN,BSN,CCM [facility] Telephonic Care Management Coordinator Direct Phone: [phone] Toll Free: [phone] Fax: [phone]

## 2016-11-09 ENCOUNTER — Ambulatory Visit: Payer: Medicare Other | Attending: Physical Medicine & Rehabilitation | Admitting: *Deleted

## 2016-11-09 DIAGNOSIS — R2681 Unsteadiness on feet: Secondary | ICD-10-CM | POA: Insufficient documentation

## 2016-11-09 DIAGNOSIS — R41840 Attention and concentration deficit: Secondary | ICD-10-CM | POA: Insufficient documentation

## 2016-11-09 DIAGNOSIS — M6281 Muscle weakness (generalized): Secondary | ICD-10-CM | POA: Insufficient documentation

## 2016-11-09 DIAGNOSIS — R41842 Visuospatial deficit: Secondary | ICD-10-CM | POA: Insufficient documentation

## 2016-11-09 DIAGNOSIS — M25551 Pain in right hip: Secondary | ICD-10-CM | POA: Diagnosis present

## 2016-11-09 DIAGNOSIS — I639 Cerebral infarction, unspecified: Secondary | ICD-10-CM | POA: Insufficient documentation

## 2016-11-09 DIAGNOSIS — I69354 Hemiplegia and hemiparesis following cerebral infarction affecting left non-dominant side: Secondary | ICD-10-CM | POA: Diagnosis present

## 2016-11-09 NOTE — Therapy (Signed)
[facility] [address] [place], [place], [address] Phone: [phone]   Fax:  [phone]  Speech Language Pathology Evaluation  Patient Details  Name: [name] MRN: [id number] Date of Birth: [birth year] Referring Provider: Dr. Bruna Potter Anguiulli  Encounter Date: 11/09/2016      End of Session - 11/09/16 1204    Visit Number 1   Number of Visits 1   SLP Start Time 1020   SLP Stop Time  1100   SLP Time Calculation (min) 40 min   Activity Tolerance Patient tolerated treatment well      Past Medical History:  Diagnosis Date  . Bladder stones   . Borderline diabetes   . BPH (benign prostatic hyperplasia)   . Coronary artery disease    cardiologist-  dr Nat Math gerhart NP--- per cath 06-02-2010 non-obstructive cad pLAD 30-40%  . Diverticulosis of colon   . DOE (dyspnea on exertion)   . Gout   . History of adenomatous polyp of colon    2002-- tubular adenoma  . History of aortic insufficiency    severe -- s/p  AVR 08-03-2010  . History of basal cell carcinoma (BCC) excision    10/ 2015  left ear  . History of small bowel obstruction    02/ 2007 mechanical sbo s/p  surgical intervention;  partial sbo 09/ 2011 and 03-20-2011 resolved without surgical intervention  . History of squamous cell carcinoma excision    2014 right foot  . History of urinary retention   . HTN (hypertension)   . Peripheral neuropathy   . S/P aortic valve replacement with prosthetic valve 08/03/2010   tissue valve  . S/P patent foramen ovale closure 08/03/2010   at same time AVR    Past Surgical History:  Procedure Laterality Date  . CARDIAC CATHETERIZATION  06-02-2010  dr Marlou Porch   non-obstructive cad- pLAD 30-40%/  normal LVSF/  severe AI  . CARDIOVASCULAR STRESS TEST  04/12/2016   Low risk nuclear perfusion study w/ no significant reversible ischemia/  normal LV function and wall motion ,  stress ef 60%/  58mm inferior  and lateral scooped ST-segment depression w/ exercise (may be repolarization abnormality), exercise capacity was moderately reduced  . CATARACT EXTRACTION W/ INTRAOCULAR LENS  IMPLANT, BILATERAL  02/2010  . CYSTOSCOPY WITH LITHOLAPAXY N/A 06/05/2016   Procedure: CYSTOSCOPY WITH LITHOLAPAXY and fulgarization of bladder neck;  Surgeon: Irine Seal, MD;  Location: [facility];  Service: Urology;  Laterality: N/A;  . EXPLORATORY LAPARTOMY /  CHOLECYSTECTOMY  02/28/2005   for Small  bowel obstruction (mechnical)  . IR ANGIO EXTRACRAN SEL COM CAROTID INNOMINATE UNI L MOD SED  10/24/2016  . IR ANGIO VERTEBRAL SEL SUBCLAVIAN INNOMINATE BILAT MOD SED  10/24/2016  . IR PERCUTANEOUS ART THROMBECTOMY/INFUSION INTRACRANIAL INC DIAG ANGIO  10/24/2016  . LEFT KNEE ARTHROSCOPY  2006  . RADIOLOGY WITH ANESTHESIA N/A 10/24/2016   Procedure: RADIOLOGY WITH ANESTHESIA;  Surgeon: Luanne Bras, MD;  Location: [facility];  Service: Radiology;  Laterality: N/A;  . RIGHT FOOT SURGERY    . RIGHT MINIATURE ANTERIOR THORACOTOMY FOR AORTIC VALVE REPLACEMENT AND CLOSURE PATENT FORAMEN OVALE  08-03-2010  DR Levada Schilling Magna-ease pericardial tissue valve (97mm)  . TRANSTHORACIC ECHOCARDIOGRAM  05/30/2016  dr skains   moderate  LVH ef 60-65%/  bioprothesis aortic valve present ,normal grandient and no AI /  mild MV calcification , moderate MR /  mild PR/ moderate TR/  PASP 43mmHg/ (RA denisty was identified 04-27-2016 echo) and is seen again today, this is likely a promient eustacian ridge, atrium is normal size    There were no vitals filed for this visit.      Subjective Assessment - Nov 16, 2016 1154    Subjective I have been doing [employer] puzzles, but haven't returned to playing Bridge.   Patient is accompained by: Family member   Special Tests wife   Currently in Pain? No/denies            SLP Evaluation [facility] - 11-16-2016 1154      SLP Visit Information   SLP Received On 11/16/2016   Referring  Provider Dr. Bruna Potter Anguiulli   Onset Date 10//17/18   Medical Diagnosis Right Basal Ganglia Stroke     General Information   HPI See H&P   Mobility Status Ambulates independently     Prior Functional Status   Cognitive/Linguistic Baseline Within functional limits   Type of Home House    Lives With Spouse   Education college graduate - [employer]   Vocation Retired     Pain Assessment   Pain Assessment No/denies pain     Cognition   Overall Cognitive Status Within Functional Limits for tasks assessed     Auditory Comprehension   Overall Auditory Comprehension Appears within functional limits for tasks assessed     Visual Recognition/Discrimination   Discrimination Within Function Limits     Reading Comprehension   Reading Status Within funtional limits     Verbal Expression   Overall Verbal Expression Appears within functional limits for tasks assessed     Written Expression   Dominant Hand Right     Oral Motor/Sensory Function   Overall Oral Motor/Sensory Function Appears within functional limits for tasks assessed     Motor Speech   Overall Motor Speech Appears within functional limits for tasks assessed     Assessment   SLP Recommendation/Assessment Patient does not need any further Speech Language Pathology Services   SLP Visit Diagnosis Attention and concentration deficit   No Skilled Speech Therapy Patient is independent with all cognitive/linguistic skills     Individuals Consulted   Consulted and Agree with Results and Recommendations Patient;Family member/caregiver   Family Member Consulted  wife                         SLP Education - 2016/11/16 Apr 13, 1202    Education provided Yes   Education Details continue with current home activities   Person(s) Educated Patient   Methods Explanation   Comprehension Verbalized understanding              Plan - 11/16/16 1205    Clinical Impression Statement Pt reports independence with self medication,  and is gradually return to normal activities. Pt/wife report no overt difficulty with functional recall, problem solving, or judgment. Skilled ST services are not recommended at this time. Please reconsult if needs arise.    Consulted and Agree with Plan of Care Patient;Family member/caregiver   Family Member Consulted wife      Patient will benefit from skilled therapeutic intervention in order to improve the following deficits and impairments:   Acute embolic stroke [facility])      G-Codes - 11/16/16 1204-04-12    Functional Assessment Tool Used asha noms, clinical judgment   Functional Limitations Attention   Attention Current Status (J4970) At least 1 percent but less than 20 percent impaired, limited or restricted  Attention Goal Status [phone]) At least 1 percent but less than 20 percent impaired, limited or restricted   Attention Discharge Status (Y8502) At least 1 percent but less than 20 percent impaired, limited or restricted      Problem List Patient Active Problem List   Diagnosis Date Noted  . PAF (paroxysmal atrial fibrillation) ([place])   . Hyperglycemia   . Right middle cerebral artery stroke ([place]) 10/29/2016  . Acute embolic stroke ([place])   . Hypokalemia   . Left hemiparesis ([place])   . Dysphagia, post-stroke   . S/P AVR (aortic valve replacement)   . Benign essential HTN   . Diabetes mellitus type 2 in nonobese (HCC)   . Benign prostatic hyperplasia   . A-fib ([place]) 10/28/2016  . CVA (cerebral vascular accident) ([place]) 10/24/2016  . HTN (hypertension)   . Gout   . History of small bowel obstruction   . History of BPH   . S/P AVR 08/03/2010   Kayleanna Lorman B. Quentin Ore [facility], CCC-SLP Speech Language Pathologist  Shonna Chock 11/09/2016, 12:08 PM  [facility] [address] [place], [place], [address] Phone: [phone]   Fax:  [phone]  Name: GARLIN BATDORF MRN: [id number] Date of Birth: 26-Nov-1935

## 2016-11-12 ENCOUNTER — Other Ambulatory Visit (HOSPITAL_COMMUNITY): Payer: Self-pay | Admitting: Interventional Radiology

## 2016-11-12 DIAGNOSIS — I639 Cerebral infarction, unspecified: Secondary | ICD-10-CM

## 2016-11-14 ENCOUNTER — Encounter: Payer: Self-pay | Admitting: Occupational Therapy

## 2016-11-14 ENCOUNTER — Encounter: Payer: Self-pay | Admitting: Physical Therapy

## 2016-11-14 ENCOUNTER — Ambulatory Visit: Payer: Medicare Other | Admitting: Occupational Therapy

## 2016-11-14 ENCOUNTER — Ambulatory Visit: Payer: Medicare Other | Admitting: Physical Therapy

## 2016-11-14 DIAGNOSIS — R41842 Visuospatial deficit: Secondary | ICD-10-CM

## 2016-11-14 DIAGNOSIS — M25551 Pain in right hip: Secondary | ICD-10-CM

## 2016-11-14 DIAGNOSIS — I69354 Hemiplegia and hemiparesis following cerebral infarction affecting left non-dominant side: Secondary | ICD-10-CM

## 2016-11-14 DIAGNOSIS — M6281 Muscle weakness (generalized): Secondary | ICD-10-CM

## 2016-11-14 DIAGNOSIS — R41840 Attention and concentration deficit: Secondary | ICD-10-CM

## 2016-11-14 DIAGNOSIS — R2681 Unsteadiness on feet: Secondary | ICD-10-CM

## 2016-11-14 DIAGNOSIS — I639 Cerebral infarction, unspecified: Secondary | ICD-10-CM | POA: Diagnosis not present

## 2016-11-14 NOTE — Therapy (Signed)
[facility] [address] [place], [place], [address] Phone: [phone]   Fax:  [phone]  Physical Therapy Evaluation  Patient Details  Name: [name] MRN: [id number] Date of Birth: Mar 28, 1935 Referring Provider: Alysia Penna, MD   Encounter Date: 11/14/2016  PT End of Session - 11/14/16 1836    Visit Number  1    Number of Visits  9    Date for PT Re-Evaluation  12/21/16    Authorization Type  [employer] Medicare G-Code & progress note every 10th visit    PT Start Time  1230    PT Stop Time  1315    PT Time Calculation (min)  45 min    Equipment Utilized During Treatment  Gait belt    Activity Tolerance  Patient tolerated treatment well    Behavior During Therapy  [facility] for tasks assessed/performed       Past Medical History:  Diagnosis Date   Bladder stones    Borderline diabetes    BPH (benign prostatic hyperplasia)    Coronary artery disease    cardiologist-  dr Nat Math gerhart NP--- per cath 06-02-2010 non-obstructive cad pLAD 30-40%   Diverticulosis of colon    DOE (dyspnea on exertion)    Gout    History of adenomatous polyp of colon    2002-- tubular adenoma   History of aortic insufficiency    severe -- s/p  AVR 08-03-2010   History of basal cell carcinoma (BCC) excision    10/ 2015  left ear   History of small bowel obstruction    02/ 2007 mechanical sbo s/p  surgical intervention;  partial sbo 09/ 2011 and 03-20-2011 resolved without surgical intervention   History of squamous cell carcinoma excision    2014 right foot   History of urinary retention    HTN (hypertension)    Peripheral neuropathy    S/P aortic valve replacement with prosthetic valve 08/03/2010   tissue valve   S/P patent foramen ovale closure 08/03/2010   at same time AVR    Past Surgical History:  Procedure Laterality Date   CARDIAC CATHETERIZATION  06-02-2010  dr Marlou Porch   non-obstructive  cad- pLAD 30-40%/  normal LVSF/  severe AI   CARDIOVASCULAR STRESS TEST  04/12/2016   Low risk nuclear perfusion study w/ no significant reversible ischemia/  normal LV function and wall motion ,  stress ef 60%/  5mm inferior and lateral scooped ST-segment depression w/ exercise (may be repolarization abnormality), exercise capacity was moderately reduced   CATARACT EXTRACTION W/ INTRAOCULAR LENS  IMPLANT, BILATERAL  02/2010   EXPLORATORY LAPARTOMY /  CHOLECYSTECTOMY  02/28/2005   for Small  bowel obstruction (mechnical)   IR ANGIO EXTRACRAN SEL COM CAROTID INNOMINATE UNI L MOD SED  10/24/2016   IR ANGIO VERTEBRAL SEL SUBCLAVIAN INNOMINATE BILAT MOD SED  10/24/2016   IR PERCUTANEOUS ART THROMBECTOMY/INFUSION INTRACRANIAL INC DIAG ANGIO  10/24/2016   LEFT KNEE ARTHROSCOPY  2006   RIGHT FOOT SURGERY     RIGHT MINIATURE ANTERIOR THORACOTOMY FOR AORTIC VALVE REPLACEMENT AND CLOSURE PATENT FORAMEN OVALE  08-03-2010  DR Levada Schilling Magna-ease pericardial tissue valve (10mm)   TRANSTHORACIC ECHOCARDIOGRAM  05/30/2016  dr skains   moderate  LVH ef 60-65%/  bioprothesis aortic valve present ,normal grandient and no AI /  mild MV calcification , moderate MR /  mild PR/ moderate TR/  PASP 9mmHg/ (RA denisty was identified 04-27-2016 echo) and is  seen again today, this is likely a promient eustacian ridge, atrium is normal size    There were no vitals filed for this visit.   Subjective Assessment - 11/14/16 1237    Subjective  This 81yo male was referred to [facility] for PT, OT & speech with diagnosis of CVA by Alysia Penna, MD.  He was admitted 10/24/16 with right MCA infarct w/p tPA and mechanical thrombectomy with TICI 3 reperfusion. Inpatient rehab 10/29/16-11/05/16.     Patient is accompained by:  Family member    Pertinent History  A-Fib (new diagnosis), HTN, s/p AVR, CAD, Gout, left knee arthroscopy, borderline DM    Limitations  Standing;Walking    Patient Stated Goals  To  improve stamina & strength to return to prior level    Currently in Pain?  Yes    Pain Score  0-No pain at night, 2-3/10   at night, 2-3/10   Pain Location  Hip    Pain Orientation  Right    Pain Descriptors / Indicators  Aching    Pain Type  Acute pain    Pain Onset  More than a month ago    Pain Frequency  Intermittent    Aggravating Factors   sleeping on side     Pain Relieving Factors  puts pillow between legs    Effect of Pain on Daily Activities  wakes him up    Multiple Pain Sites  No         [facility] PT Assessment - 11/14/16 1230      Assessment   Medical Diagnosis  CVA    Referring Provider  Alysia Penna, MD    Onset Date/Surgical Date  10/24/16    Hand Dominance  Right    Prior Therapy  inpatient rehab 10/29/16-11/05/16      Precautions   Precautions  Fall    Precaution Comments  no driving      Balance Screen   Has the patient fallen in the past 6 months  Yes    How many times?  1 playing catch in yard   playing catch in yard   Has the patient had a decrease in activity level because of a fear of falling?   No    Is the patient reluctant to leave their home because of a fear of falling?   No      Home Social worker  Private residence    Living Arrangements  Spouse/significant other;Children [phone]   51yo   Type of [place] to enter    Entrance Stairs-Number of Steps  7    Entrance Stairs-Rails  Right;Left;Can reach both    [place]  Two level;Full bath on main level;Able to live on main level with bedroom/bathroom upstairs are extra bedrooms & bonus room   upstairs are extra bedrooms & bonus room   Alternate Level Stairs-Number of Steps  18-20    Alternate Level Stairs-Rails  Right;Left;Can reach both    [place] - single point;Shower seat      Prior Function   Level of Independence  Independent;Independent with household mobility without device;Independent with community mobility without device     Vocation  Retired    Leisure  bridge, walk usually mile, puching bag, yard work      Investment banker, corporate Control  Postural limitations    Postural Limitations  Rounded Shoulders;Forward head  ROM / Strength   AROM / PROM / Strength  AROM;Strength      AROM   Overall AROM   Within functional limits for tasks performed      Strength   Overall Strength  Deficits    Strength Assessment Site  Hip;Knee;Ankle    Right/Left Hip  Right;Left    Right Hip Flexion  4+/5    Right Hip Extension  4/5    Right Hip ABduction  4-/5    Left Hip Flexion  4/5    Left Hip Extension  4/5    Left Hip ABduction  4/5    Right/Left Knee  Right;Left    Right Knee Flexion  5/5    Right Knee Extension  5/5    Left Knee Flexion  4/5    Left Knee Extension  4+/5    Right/Left Ankle  Right;Left    Right Ankle Dorsiflexion  5/5    Right Ankle Plantar Flexion  5/5    Left Ankle Dorsiflexion  4+/5    Left Ankle Plantar Flexion  3/5      Special Tests    Special Tests  Hip Special Tests    Hip Special Tests   Ober's Test      Ober's Test   Findings  Positive    Side  Right      Transfers   Transfers  Sit to Stand;Stand to Sit    Sit to Stand  Without upper extremity assist;From chair/3-in-1    Stand to Sit  6: Modified independent (Device/Increase time);Without upper extremity assist;To chair/3-in-1      Ambulation/Gait   Ambulation/Gait  Yes    Ambulation/Gait Assistance  5: Supervision    Ambulation Distance (Feet)  1100 Feet    Assistive device  None    Gait Pattern  Step-through pattern;Decreased arm swing - left;Decreased stance time - left;Right flexed knee in stance;Antalgic    Ambulation Surface  Indoor;Level    Gait velocity  2.99    Stairs  Yes    Stairs Assistance  5: Supervision    Stair Management Technique  One rail Right;Alternating pattern;Step to pattern;Forwards ascends alternating & descends Step-To   ascends alternating & descends Step-To    Number of Stairs  4      6 Minute Walk- Baseline   6 Minute Walk- Baseline  yes    BP (mmHg)  116/72    HR (bpm)  72    02 Sat (%RA)  97 %    Modified Borg Scale for Dyspnea  0- Nothing at all      6 Minute walk- Post Test   6 Minute Walk Post Test  yes    BP (mmHg)  129/66    HR (bpm)  71    02 Sat (%RA)  99 %    Modified Borg Scale for Dyspnea  2- Mild shortness of breath    Perceived Rate of Exertion (Borg)  13- Somewhat hard      6 minute walk test results    Aerobic Endurance Distance Walked  1094      Standardized Balance Assessment   Standardized Balance Assessment  Berg Balance Test      Berg Balance Test   Sit to Stand  Able to stand without using hands and stabilize independently    Standing Unsupported  Able to stand safely 2 minutes    Sitting with Back Unsupported but Feet Supported on Floor or Stool  Able to sit safely and  securely 2 minutes    Stand to Sit  Sits safely with minimal use of hands    Transfers  Able to transfer safely, minor use of hands    Standing Unsupported with Eyes Closed  Able to stand 10 seconds safely    Standing Ubsupported with Feet Together  Able to place feet together independently and stand 1 minute safely    From Standing, Reach Forward with Outstretched Arm  Can reach confidently >25 cm (10")    From Standing Position, Pick up Object from Floor  Able to pick up shoe safely and easily    From Standing Position, Turn to Look Behind Over each Shoulder  Looks behind from both sides and weight shifts well    Turn 360 Degrees  Able to turn 360 degrees safely but slowly    Standing Unsupported, Alternately Place Feet on Step/Stool  Able to stand independently and safely and complete 8 steps in 20 seconds    Standing Unsupported, One Foot in Front  Able to plae foot ahead of the other independently and hold 30 seconds    Standing on One Leg  Able to lift leg independently and hold 5-10 seconds    Total Score  52      Functional Gait   Assessment   Gait assessed   Yes    Gait Level Surface  Walks 20 ft in less than 7 sec but greater than 5.5 sec, uses assistive device, slower speed, mild gait deviations, or deviates 6-10 in outside of the 12 in walkway width.    Change in Gait Speed  Makes only minor adjustments to walking speed, or accomplishes a change in speed with significant gait deviations, deviates 10-15 in outside the 12 in walkway width, or changes speed but loses balance but is able to recover and continue walking.    Gait with Horizontal Head Turns  Performs head turns with moderate changes in gait velocity, slows down, deviates 10-15 in outside 12 in walkway width but recovers, can continue to walk.    Gait with Vertical Head Turns  Performs task with moderate change in gait velocity, slows down, deviates 10-15 in outside 12 in walkway width but recovers, can continue to walk.    Gait and Pivot Turn  Turns slowly, requires verbal cueing, or requires several small steps to catch balance following turn and stop    Step Over Obstacle  Is able to step over one shoe box (4.5 in total height) but must slow down and adjust steps to clear box safely. May require verbal cueing.    Gait with Narrow Base of Support  Ambulates less than 4 steps heel to toe or cannot perform without assistance.    Gait with Eyes Closed  Cannot walk 20 ft without assistance, severe gait deviations or imbalance, deviates greater than 15 in outside 12 in walkway width or will not attempt task.    Ambulating Backwards  Walks 20 ft, slow speed, abnormal gait pattern, evidence for imbalance, deviates 10-15 in outside 12 in walkway width.    Steps  Two feet to a stair, must use rail.    Total Score  9             Objective measurements completed on examination: See above findings.                   PT Long Term Goals - 11/14/16 1852      PT LONG TERM GOAL #1  Title  Patient demonstrates & verbalizes understanding of ongoing HEP  / fitness plan. (All LTGs Target Date: 12/21/2016)    Time  4    Period  Weeks    Status  New    Target Date  12/21/16      PT LONG TERM GOAL #2   Title  6-Minute Walk Test >1320'     Time  4    Period  Weeks    Status  New    Target Date  12/21/16      PT LONG TERM GOAL #3   Title  Functional Gait Assessment >19/30    Time  4    Period  Weeks    Status  New    Target Date  12/21/16      PT LONG TERM GOAL #4   Title  Patient ambulates 1000' outdoors including grass, ramps, curbs without device independently.     Time  4    Period  Weeks    Status  New    Target Date  12/21/16      PT LONG TERM GOAL #5   Title  Patient reports right hip pain </= 2/10    Time  4    Period  Weeks    Status  New    Target Date  12/21/16             Plan - 11/14/16 1839    Clinical Impression Statement  This 81yo male on 10/24/16 had right MCA infarct w/p tPA and mechanical thrombectomy with TICI 3 reperfusion. Inpatient rehab 10/29/16-11/05/16. He has right hip pain & tightness with Greater Trochanteric bursitis and Iliotibial Band Syndrome. He has weakness in left lower extremity with increased functional impact as he fatigues during gait. Berg Balance 52/56 indicates low fall risk. Functional Gait Assessment 9/30 indicates high fall risk with gait activities. Patient reports prior functional status was walking 1 mile daily with his wife. He is currently limited to 1/4 mile. 6-Minute Walk Test was 1094 but his left leg fatigued with decreased stability in stance and minimal clearance in swing by end. His vital signs had appropriate reaction to activity. Patient appears would benefit from skilled care to improve his mobility & safety.     History and Personal Factors relevant to plan of care:  New diagnosis A-Fib with plan for 30-day monitor, CAD, gout,     Clinical Presentation  Stable    Clinical Decision Making  Moderate    Rehab Potential  Good    Clinical Impairments Affecting  Rehab Potential  A-Fib (new diagnosis), HTN, s/p AVR, CAD, Gout, left knee arthroscopy, borderline DM    PT Frequency  2x / week    PT Duration  4 weeks    PT Treatment/Interventions  ADLs/Self Care Home Management;DME Instruction;Gait training;Stair training;Functional mobility training;Therapeutic activities;Therapeutic exercise;Balance training;Neuromuscular re-education;Patient/family education;Moist Heat;Ultrasound;Manual techniques;Vestibular    PT Next Visit Plan  instruct in right hip positioning in bed, exercises for bursitis & ITB syndrome, manual technique, Instruct in HEP for LE strength & balance    Consulted and Agree with Plan of Care  Patient;Family member/caregiver    Family Member Consulted  wife       Patient will benefit from skilled therapeutic intervention in order to improve the following deficits and impairments:  Abnormal gait, Decreased activity tolerance, Decreased balance, Decreased endurance, Decreased strength, Pain, Postural dysfunction, Decreased mobility  Visit Diagnosis: Pain in right hip  Hemiplegia and hemiparesis following cerebral infarction affecting left  non-dominant side (HCC)  Muscle weakness (generalized)  Unsteadiness on feet  G-Codes - 13-Dec-2016 1857    Functional Assessment Tool Used (Outpatient Only)  Functional Gait Assessment 9/30    Functional Limitation  Mobility: Walking and moving around    Mobility: Walking and Moving Around Current Status [phone])  At least 60 percent but less than 80 percent impaired, limited or restricted    Mobility: Walking and Moving Around Goal Status [phone])  At least 20 percent but less than 40 percent impaired, limited or restricted        Problem List Patient Active Problem List   Diagnosis Date Noted   PAF (paroxysmal atrial fibrillation) ([place])    Hyperglycemia    Right middle cerebral artery stroke ([place]) 64/68/0321   Acute embolic stroke (HCC)    Hypokalemia    Left hemiparesis (HCC)     Dysphagia, post-stroke    S/P AVR (aortic valve replacement)    Benign essential HTN    Diabetes mellitus type 2 in nonobese ([employer])    Benign prostatic hyperplasia    A-fib ([place]) 10/28/2016   CVA (cerebral vascular accident) ([place]) 10/24/2016   HTN (hypertension)    Gout    History of small bowel obstruction    History of BPH    S/P AVR 08/03/2010    Kalyani Maeda PT, DPT 13-Dec-2016, 6:58 PM  [facility] [address] [place], [place], [address] Phone: [phone]   Fax:  [phone]  Name: [name] MRN: [id number] Date of Birth: 1935-06-15

## 2016-11-14 NOTE — Therapy (Signed)
[facility] [address] [place], [place], [address] Phone: [phone]   Fax:  [phone]  Occupational Therapy Evaluation  Patient Details  Name: [name] MRN: [id number] Date of Birth: March 18, 1935 Referring Provider: Dr. Alysia Penna   Encounter Date: 11/14/2016  OT End of Session - 11/14/16 1624    Visit Number  1    Number of Visits  9    Date for OT Re-Evaluation  12/14/16    Authorization Type  [employer] Medicare, no visit limit/no auth; G-code needed    Authorization Time Period  cert. date 11/14/16-01/13/2017    Authorization - Visit Number  1    Authorization - Number of Visits  10    OT Start Time  1318    OT Stop Time  1400    OT Time Calculation (min)  42 min    Activity Tolerance  Patient tolerated treatment well    Behavior During Therapy  WFL for tasks assessed/performed       Past Medical History:  Diagnosis Date  . Bladder stones   . Borderline diabetes   . BPH (benign prostatic hyperplasia)   . Coronary artery disease    cardiologist-  dr Nat Math gerhart NP--- per cath 06-02-2010 non-obstructive cad pLAD 30-40%  . Diverticulosis of colon   . DOE (dyspnea on exertion)   . Gout   . History of adenomatous polyp of colon    2002-- tubular adenoma  . History of aortic insufficiency    severe -- s/p  AVR 08-03-2010  . History of basal cell carcinoma (BCC) excision    10/ 2015  left ear  . History of small bowel obstruction    02/ 2007 mechanical sbo s/p  surgical intervention;  partial sbo 09/ 2011 and 03-20-2011 resolved without surgical intervention  . History of squamous cell carcinoma excision    2014 right foot  . History of urinary retention   . HTN (hypertension)   . Peripheral neuropathy   . S/P aortic valve replacement with prosthetic valve 08/03/2010   tissue valve  . S/P patent foramen ovale closure 08/03/2010   at same time AVR    Past Surgical History:   Procedure Laterality Date  . CARDIAC CATHETERIZATION  06-02-2010  dr Marlou Porch   non-obstructive cad- pLAD 30-40%/  normal LVSF/  severe AI  . CARDIOVASCULAR STRESS TEST  04/12/2016   Low risk nuclear perfusion study w/ no significant reversible ischemia/  normal LV function and wall motion ,  stress ef 60%/  8mm inferior and lateral scooped ST-segment depression w/ exercise (may be repolarization abnormality), exercise capacity was moderately reduced  . CATARACT EXTRACTION W/ INTRAOCULAR LENS  IMPLANT, BILATERAL  02/2010  . EXPLORATORY LAPARTOMY /  CHOLECYSTECTOMY  02/28/2005   for Small  bowel obstruction (mechnical)  . IR ANGIO EXTRACRAN SEL COM CAROTID INNOMINATE UNI L MOD SED  10/24/2016  . IR ANGIO VERTEBRAL SEL SUBCLAVIAN INNOMINATE BILAT MOD SED  10/24/2016  . IR PERCUTANEOUS ART THROMBECTOMY/INFUSION INTRACRANIAL INC DIAG ANGIO  10/24/2016  . LEFT KNEE ARTHROSCOPY  2006  . RIGHT FOOT SURGERY    . RIGHT MINIATURE ANTERIOR THORACOTOMY FOR AORTIC VALVE REPLACEMENT AND CLOSURE PATENT FORAMEN OVALE  08-03-2010  DR Levada Schilling Magna-ease pericardial tissue valve (18mm)  . TRANSTHORACIC ECHOCARDIOGRAM  05/30/2016  dr skains   moderate  LVH ef 60-65%/  bioprothesis aortic valve present ,normal grandient and no AI /  mild MV calcification , moderate  MR /  mild PR/ moderate TR/  PASP 47mmHg/ (RA denisty was identified 04-27-2016 echo) and is seen again today, this is likely a promient eustacian ridge, atrium is normal size    There were no vitals filed for this visit.  Subjective Assessment - 11/14/16 1319    Subjective   pt reports that he notices that his strength and stamina is not as good and that cognitive tasks are more difficult    Patient is accompained by:  Family member wife   wife   Pertinent History  a-fib, HTN, boarderline DM, CAD, aortic valve replacement, peripheral neuropathy, gout, diverticulosis, s/p patent foramen ovale closure     Limitations  fall risk, no driving     Patient Stated Goals  improve strength and endurance    Currently in Pain?  No/denies R hip pain, ?bursitis--none today started 4-5 months ago   R hip pain, ?bursitis--none today started 4-5 months ago   Pain Onset  More than a month ago        [facility] OT Assessment - 11/14/16 1323      Assessment   Diagnosis  R CVA    Referring Provider  Dr. Alysia Penna    Onset Date  10/24/16    Prior Therapy  hospitalized 10/24/16-11/04/16      Precautions   Precautions  Fall    Precaution Comments  no driving      Balance Screen   Has the patient fallen in the past 6 months  Yes    How many times?  1 throwing ball with grandson   throwing ball with grandson     Home  Environment   Family/patient expects to be discharged to:  Private residence    Lives With  [employer]      Prior Function   Level of Independence  Independent    Vocation  Retired    Leisure  bridge, walk usually mile, punching/speed bag, yard work, Hydrologist puzzles, rode bike in neighborhood has not returned to bridge, Investment banker, corporate, 1/2 exercise   has not returned to bridge, all yardwork, 1/2 exercise     ADL   ADL comments  BADLs independently      IADL   Prior Level of Function Light Housekeeping  weeding, pruning, mowing (riding lawnmower)    Light Housekeeping  -- put out grass seed since CVA, but not other yardwork   put out grass seed since CVA, but not other yardwork   Prior Level of Function Meal Prep  wife performs most of cooking; pt performs light tasks independently    Meal Prep  Able to complete simple warm meal prep baseline   baseline   Prior Level of Function Community Mobility  drove independently    [employer] on family or friends for transportation no driving per MD   no driving per MD   Medication Management  Is responsible for taking medication in correct dosages at correct time initiated using memory compensation strategies    initiated using memory compensation strategies     Prior Level of Function Financial Management  wife has always performed, pt does Sales promotion account executive Status  Independent    Mobility Status Comments  PT notes mild difficulty with divided attention/head turns      Written Expression   Dominant Hand  Right      Vision - History   Baseline Vision  -- wears glasses only when driving  wears glasses only when driving   Visual History  Cataracts both eyes   both eyes     Vision Assessment   Eye Alignment  Within Functional Limits    Comment  Environmental scanning (simple) with ambulation with approx 67% accuracy (8/12 items found), on 2nd attempt found additional 2 items.        Activity Tolerance   Activity Tolerance Comments  Pt reports that he is only walking 1/67mile (vs 10mile prior) with someone with him, and only able to do 1 push-up (did 10 prior), and only 5 min on speed bag (did 2x day prior) has not returned to all previous yardwork   has not returned to all previous yardwork     Cognition   Overall Cognitive Status  Within Functional Limits for tasks assessed pt does report incr time [place] puzzles/bridge on computer   pt does report incr time [employer] puzzles/bridge on computer   Area of Impairment  --    Attention  Divided impaired during ambulation/visual scanning   impaired during ambulation/visual scanning     Sensation   Additional Comments  pt reports prior neuropathy in feet      Coordination   9 Hole Peg Test  Right;Left    Right 9 Hole Peg Test  27.88    Left 9 Hole Peg Test  30.37      ROM / Strength   AROM / PROM / Strength  AROM;Strength      AROM   Overall AROM   Within functional limits for tasks performed      Strength   Overall Strength Comments  RUE proximal strength grossly 5/5, L shoulder strength grossly 4/5, L biceps/triceps 5/5      Hand Function   Right Hand Grip (lbs)  67    Left Hand Grip (lbs)  55                      OT Education - 11/14/16 1615     Education provided  Yes    Education Details  OT eval results/POC    Person(s) Educated  Patient;Spouse    Methods  Explanation    Comprehension  Verbalized understanding          OT Long Term Goals - 11/14/16 1637      OT LONG TERM GOAL #1   Title  Pt will be independent with HEP for LUE strength.--check LTGs 12/14/16    Time  4    Period  Weeks    Status  New      OT LONG TERM GOAL #2   Title  Pt will perfom simple-mod complex environmental scanning with at least 90% accuracy in prep for return to driving.    Time  4    Period  Weeks    Status  New      OT LONG TERM GOAL #3   Title  Pt will demo safety and endurance to be able to return to previous yardwork.      Time  4    Period  Weeks    Status  New            Plan - 11/14/16 1627    Clinical Impression Statement  Pt presents today with decr LUE strength, decr divided attention/environmental scanning, decr balance for IADL.  Pt would benefit from occupational therapy to address these deficits in order to return to prior level of function including ADLs/IADLs and previous leisure tasks.  Occupational Profile and client history currently impacting functional performance  Pt is a 81 y.o. male s/p R CVA 10/24/16.  Pt with PMH that includes:  a-fib, HTN, boarderline DM, CAD, aortic valve replacement, peripheral neuropathy, gout, diverticulosis, s/p patent foramen ovale closure.  Pt was very active with yardwork, playing bridge, exercising, driving prior to CVA, but currently is not driving, is not playing bridge in bridge club, or doing all yardwork/exercise as he was prior to CVA.    Occupational performance deficits (Please refer to evaluation for details):  IADL's;Leisure;Social Participation;ADL's    Rehab Potential  Good    OT Frequency  2x / week    OT Duration  4 weeks +eval (or 9 visits)   +eval (or 9 visits)   OT Treatment/Interventions  Self-care/ADL training;Patient/family education;Balance  training;Therapeutic exercises;Therapeutic activities;Cognitive remediation/compensation;Visual/perceptual remediation/compensation;Energy conservation;Functional Mobility Training;Neuromuscular education    Plan  theraband HEP for L shoulder strength, environmental scanning, activity tolerance    Clinical Decision Making  Limited treatment options, no task modification necessary    Recommended Other Services  current with PT; ST eval only per note    Consulted and Agree with Plan of Care  Patient;Family member/caregiver    Family Member Consulted  wife       Patient will benefit from skilled therapeutic intervention in order to improve the following deficits and impairments:  Decreased cognition, Impaired vision/preception, Decreased activity tolerance, Decreased endurance, Decreased strength, Decreased balance  Visit Diagnosis: Hemiplegia and hemiparesis following cerebral infarction affecting left non-dominant side (HCC)  Attention and concentration deficit  Visuospatial deficit  Muscle weakness (generalized)  Unsteadiness on feet  G-Codes - 12-07-16 1639    Functional Assessment Tool Used (Outpatient only)  has not returned to all yardwork or exercise, environmental scanning with 67% accuracy initially    Functional Limitation  Self care    Self Care Current Status (W2956)  At least 20 percent but less than 40 percent impaired, limited or restricted    Self Care Goal Status (O1308)  At least 1 percent but less than 20 percent impaired, limited or restricted       Problem List Patient Active Problem List   Diagnosis Date Noted  . PAF (paroxysmal atrial fibrillation) ([place])   . Hyperglycemia   . Right middle cerebral artery stroke ([place]) 10/29/2016  . Acute embolic stroke ([place])   . Hypokalemia   . Left hemiparesis ([place])   . Dysphagia, post-stroke   . S/P AVR (aortic valve replacement)   . Benign essential HTN   . Diabetes mellitus type 2 in nonobese (HCC)   . Benign  prostatic hyperplasia   . A-fib ([place]) 10/28/2016  . CVA (cerebral vascular accident) ([place]) 10/24/2016  . HTN (hypertension)   . Gout   . History of small bowel obstruction   . History of BPH   . S/P AVR 08/03/2010    [facility] 12-07-16, 4:40 PM  [facility] [address] [place], [place], [address] Phone: [phone]   Fax:  [phone]  Name: THEUS ESPIN MRN: [id number] Date of Birth: 1935/05/19   Vianne Bulls, OTR/L [facility] [address] [place], [place]  [address] [phone] phone [phone] 2016-12-07 4:40 PM

## 2016-11-19 ENCOUNTER — Other Ambulatory Visit: Payer: Self-pay

## 2016-11-19 ENCOUNTER — Ambulatory Visit: Payer: Medicare Other | Admitting: Nurse Practitioner

## 2016-11-19 ENCOUNTER — Encounter: Payer: Self-pay | Admitting: Nurse Practitioner

## 2016-11-19 VITALS — BP 126/72 | HR 56 | Ht 72.0 in | Wt 174.8 lb

## 2016-11-19 DIAGNOSIS — Z79899 Other long term (current) drug therapy: Secondary | ICD-10-CM | POA: Diagnosis not present

## 2016-11-19 DIAGNOSIS — I481 Persistent atrial fibrillation: Secondary | ICD-10-CM | POA: Diagnosis not present

## 2016-11-19 DIAGNOSIS — I4819 Other persistent atrial fibrillation: Secondary | ICD-10-CM

## 2016-11-19 LAB — BASIC METABOLIC PANEL
BUN/Creatinine Ratio: 15 (ref 10–24)
BUN: 14 mg/dL (ref 8–27)
CO2: 21 mmol/L (ref 20–29)
Calcium: 9.4 mg/dL (ref 8.6–10.2)
Chloride: 105 mmol/L (ref 96–106)
Creatinine, Ser: 0.91 mg/dL (ref 0.76–1.27)
GFR calc Af Amer: 92 mL/min/{1.73_m2} (ref >59)
GFR calc non Af Amer: 80 mL/min/{1.73_m2} (ref >59)
Glucose: 95 mg/dL (ref 65–99)
Potassium: 4.3 mmol/L (ref 3.5–5.2)
Sodium: 142 mmol/L (ref 134–144)

## 2016-11-19 LAB — CBC
Hematocrit: 36.9 % — ABNORMAL LOW (ref 37.5–51.0)
Hemoglobin: 12.6 g/dL — ABNORMAL LOW (ref 13.0–17.7)
MCH: 32.7 pg (ref 26.6–33.0)
MCHC: 34.1 g/dL (ref 31.5–35.7)
MCV: 96 fL (ref 79–97)
Platelets: 134 10*3/uL — ABNORMAL LOW (ref 150–379)
RBC: 3.85 x10E6/uL — ABNORMAL LOW (ref 4.14–5.80)
RDW: 14 % (ref 12.3–15.4)
WBC: 5.5 10*3/uL (ref 3.4–10.8)

## 2016-11-19 MED ORDER — APIXABAN 5 MG PO TABS: 5.0000 mg | ORAL_TABLET | Freq: Two times a day (BID) | ORAL | 1 refills | Status: DC

## 2016-11-19 MED ORDER — AMLODIPINE BESYLATE 10 MG PO TABS: 10.0000 mg | ORAL_TABLET | Freq: Every day | ORAL | 3 refills | Status: DC

## 2016-11-19 MED ORDER — ATENOLOL 25 MG PO TABS: 12.5000 mg | ORAL_TABLET | Freq: Every day | ORAL | 3 refills | Status: DC

## 2016-11-19 NOTE — Patient Outreach (Signed)
[facility]) Care Management  11/19/2016  [place] [birth year] [id number]  EMMI: stroke Referral date: 11/19/16 Referral source: EMMI stroke red alert Referral reason: Went to follow up appointment: NO Day # 13 Attempt #1  Telephone call to patient regarding EMMI stroke red alert. Unable to reach patient. HIPAA compliant voice message left with call back phone number.    PLAN: RNCM will attempt 2nd telephone outreach to patient within 5 business days.   Quinn Plowman RN,BSN,CCM [facility] Telephonic  [phone]

## 2016-11-19 NOTE — Patient Instructions (Addendum)
We will be checking the following labs today - BMET and CBC   Medication Instructions:    Continue with your current medicines. BUT  I am cutting the Atenolol back to 12.5 mg a day  I sent your refills in today to [facility]  Tylenol for pain    Testing/Procedures To Be Arranged:  N/A  Follow-Up:   See me next month as planned with EKG.     Other Special Instructions:   N/A    If you need a refill on your cardiac medications before your next appointment, please call your pharmacy.   Call the [place] office at [phone] if you have any questions, problems or concerns.
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
## 2016-11-20 ENCOUNTER — Telehealth: Payer: Self-pay | Admitting: *Deleted

## 2016-11-20 ENCOUNTER — Other Ambulatory Visit: Payer: Self-pay | Admitting: *Deleted

## 2016-11-20 ENCOUNTER — Other Ambulatory Visit: Payer: Self-pay

## 2016-11-20 MED ORDER — APIXABAN 5 MG PO TABS: 5.0000 mg | ORAL_TABLET | Freq: Two times a day (BID) | ORAL | 1 refills | Status: DC

## 2016-11-20 NOTE — Telephone Encounter (Signed)
error 

## 2016-11-20 NOTE — Patient Outreach (Signed)
[facility]) Care Management  11/20/2016  [name] 11/18/1935 [id number]     EMMI-STROKE RED ON EMMI ALERT Day # 13 Date: 11/18/16 Red Alert Reason: " Went to follow up appt? No"   Outreach attempt # 2 to patient. No answer at present.        Plan: RN CM will make outreach attempt to patient within one business day.    Enzo Montgomery, RN,BSN,CCM [facility] Telephonic Care Management Coordinator Direct Phone: [phone] Toll Free: [phone] Fax: [phone]

## 2016-11-21 ENCOUNTER — Encounter: Payer: Self-pay | Admitting: Occupational Therapy

## 2016-11-21 ENCOUNTER — Other Ambulatory Visit: Payer: Self-pay

## 2016-11-21 ENCOUNTER — Ambulatory Visit: Payer: Medicare Other | Admitting: Physical Therapy

## 2016-11-21 ENCOUNTER — Encounter: Payer: Self-pay | Admitting: Physical Therapy

## 2016-11-21 ENCOUNTER — Ambulatory Visit: Payer: Medicare Other | Admitting: Occupational Therapy

## 2016-11-21 DIAGNOSIS — R41842 Visuospatial deficit: Secondary | ICD-10-CM

## 2016-11-21 DIAGNOSIS — I69354 Hemiplegia and hemiparesis following cerebral infarction affecting left non-dominant side: Secondary | ICD-10-CM

## 2016-11-21 DIAGNOSIS — R41840 Attention and concentration deficit: Secondary | ICD-10-CM

## 2016-11-21 DIAGNOSIS — M6281 Muscle weakness (generalized): Secondary | ICD-10-CM

## 2016-11-21 DIAGNOSIS — I639 Cerebral infarction, unspecified: Secondary | ICD-10-CM | POA: Diagnosis not present

## 2016-11-21 DIAGNOSIS — R2681 Unsteadiness on feet: Secondary | ICD-10-CM

## 2016-11-21 DIAGNOSIS — M25551 Pain in right hip: Secondary | ICD-10-CM

## 2016-11-21 NOTE — Patient Instructions (Signed)
Pushups:   Push ups on knees x 10 reps  Plank on forearms and toes:  Keep body straight, with emphasis on pushing forearms down into floor, and lifting chest up.  Rest, repeat x 3.  5 lb dumbbells Seated or standing- raise both arms up toward each other overhead. 10 reps in front of mirror.  Rest, repeat x 3  Upright row:  5 lbs.  X 10 reps, rest repeat X 3  Bicep curls:  10lb weight x 10reps; rest, repeat x 3  Speed bag for up to 5 minutes with emphasis on accuracy with left hand contact.

## 2016-11-21 NOTE — Patient Instructions (Signed)
*  SLEEP WITH A PILLOW BETWEEN YOUR KNEES WHEN LYING ON YOUR SIDE*  *USE A ROLLING PIN AND ROLL WITH STEADY PRESSURE DOWN THE SIDE OF THE RIGHT LEG AND BACK UP*  Piriformis Stretch, Supine    Lie supine, right ankle crossed onto left knee. Press right knee down and slide left foot as close to your buttocks as you can tolerate until stretch is felt in buttock of top leg. Hold _30__ seconds.  Repeat _2__ times per session. Do __2_ sessions per day.  IT Band: Wall Lean With Crossed Leg    Stand with right hand on wall. Cross right leg behind left leg. Stretch right hip toward wall with other arm supporting trunk. Hold __30_ seconds. Relax. Repeat _2__ times. Do _2__ times a day.  HIP: Abduction - Standing    Squeeze glutes. Raise leg out and slightly back. _12__ reps per side, _2__ sets per day,  Hold onto a support.  SINGLE LIMB STANCE    Hold on for support; Stance: single leg on floor. Raise leg. Hold _12__ seconds keeping hips level. Repeat with other leg. _2__ reps per set, _2__ sets per day  Copyright  VHI. All rights reserved.

## 2016-11-21 NOTE — Patient Outreach (Signed)
[facility]) Care Management  11/21/2016  [name] 05-25-1935 [id number]   EMMI-STROKE RED ON EMMI ALERT Day # 13 Date: 11/18/16 Red Alert Reason: " Went to follow up appt? No"   Outreach attempt #3 to patient. No answer at present.  RN CM left HIPAA compliant voicemail message along with contact info. Multiple attempts to establish contact with patient without success. Case is being closed at this time.     Plan: RN CM will notify [facility] administrative assistant of case status.     Enzo Montgomery, RN,BSN,CCM [place] Telephonic Care Management Coordinator Direct Phone: [phone] Toll Free: [phone] Fax: [phone]

## 2016-11-21 NOTE — Therapy (Signed)
[facility] [address] [place], [place], [address] Phone: [phone]   Fax:  [phone]  Physical Therapy Treatment  Patient Details  Name: [name] MRN: [id number] Date of Birth: 09/02/1935 Referring Provider: Alysia Penna, MD   Encounter Date: 11/21/2016  PT End of Session - 11/21/16 1321    Visit Number  2    Number of Visits  9    Date for PT Re-Evaluation  12/21/16    Authorization Type  [employer] Medicare G-Code & progress note every 10th visit    PT Start Time  0936    PT Stop Time  1016    PT Time Calculation (min)  40 min    Equipment Utilized During Treatment  Gait belt    Activity Tolerance  Patient tolerated treatment well    Behavior During Therapy  [name] for tasks assessed/performed       Past Medical History:  Diagnosis Date  . Bladder stones   . Borderline diabetes   . BPH (benign prostatic hyperplasia)   . Coronary artery disease    cardiologist-  dr Nat Math gerhart NP--- per cath 06-02-2010 non-obstructive cad pLAD 30-40%  . Diverticulosis of colon   . DOE (dyspnea on exertion)   . Gout   . History of adenomatous polyp of colon    2002-- tubular adenoma  . History of aortic insufficiency    severe -- s/p  AVR 08-03-2010  . History of basal cell carcinoma (BCC) excision    10/ 2015  left ear  . History of small bowel obstruction    02/ 2007 mechanical sbo s/p  surgical intervention;  partial sbo 09/ 2011 and 03-20-2011 resolved without surgical intervention  . History of squamous cell carcinoma excision    2014 right foot  . History of urinary retention   . HTN (hypertension)   . Peripheral neuropathy   . S/P aortic valve replacement with prosthetic valve 08/03/2010   tissue valve  . S/P patent foramen ovale closure 08/03/2010   at same time AVR    Past Surgical History:  Procedure Laterality Date  . CARDIAC CATHETERIZATION  06-02-2010  dr Marlou Porch   non-obstructive  cad- pLAD 30-40%/  normal LVSF/  severe AI  . CARDIOVASCULAR STRESS TEST  04/12/2016   Low risk nuclear perfusion study w/ no significant reversible ischemia/  normal LV function and wall motion ,  stress ef 60%/  61mm inferior and lateral scooped ST-segment depression w/ exercise (may be repolarization abnormality), exercise capacity was moderately reduced  . CATARACT EXTRACTION W/ INTRAOCULAR LENS  IMPLANT, BILATERAL  02/2010  . EXPLORATORY LAPARTOMY /  CHOLECYSTECTOMY  02/28/2005   for Small  bowel obstruction (mechnical)  . IR ANGIO EXTRACRAN SEL COM CAROTID INNOMINATE UNI L MOD SED  10/24/2016  . IR ANGIO VERTEBRAL SEL SUBCLAVIAN INNOMINATE BILAT MOD SED  10/24/2016  . IR PERCUTANEOUS ART THROMBECTOMY/INFUSION INTRACRANIAL INC DIAG ANGIO  10/24/2016  . LEFT KNEE ARTHROSCOPY  2006  . RIGHT FOOT SURGERY    . RIGHT MINIATURE ANTERIOR THORACOTOMY FOR AORTIC VALVE REPLACEMENT AND CLOSURE PATENT FORAMEN OVALE  08-03-2010  DR Levada Schilling Magna-ease pericardial tissue valve (95mm)  . TRANSTHORACIC ECHOCARDIOGRAM  05/30/2016  dr skains   moderate  LVH ef 60-65%/  bioprothesis aortic valve present ,normal grandient and no AI /  mild MV calcification , moderate MR /  mild PR/ moderate TR/  PASP 5mmHg/ (RA denisty was identified 04-27-2016 echo) and is  seen again today, this is likely a promient eustacian ridge, atrium is normal size    There were no vitals filed for this visit.  Subjective Assessment - 11/21/16 0938    Subjective  Reports he had an injection in his R hip but it continues to cause him pain and wake him up at night.  Feeling tired after session with OT.    Patient is accompained by:  Family member    Pertinent History  A-Fib (new diagnosis), HTN, s/p AVR, CAD, Gout, left knee arthroscopy, borderline DM    Limitations  Standing;Walking    Patient Stated Goals  To improve stamina & strength to return to prior level    Currently in Pain?  No/denies      Performed following  exercises with wife observing:  Reviewed sleeping positions and use of pillow between legs to maintain neutral hip alignment/rotation when in sidelying.  Use of foam roller for myofascial release of R IT band; advised pt's wife to perform with rolling pin and towels over lateral leg for cushioning  Piriformis Stretch, Supine    Lie supine, right ankle crossed onto left knee. Press right knee down and slide left foot as close to your buttocks as you can tolerate until stretch is felt in buttock of top leg. Hold _30__ seconds.  Repeat _2__ times per session. Do __2_ sessions per day.  IT Band: Wall Lean With Crossed Leg    Stand with right hand on wall. Cross right leg behind left leg. Stretch right hip toward wall with other arm supporting trunk. Hold __30_ seconds. Relax. Repeat _2__ times. Do _2__ times a day.  HIP: Abduction - Standing    Squeeze glutes. Raise leg out and slightly back. _12__ reps per side, _2__ sets per day,  Hold onto a support.  SINGLE LIMB STANCE    Hold on for support; Stance: single leg on floor. Raise leg. Hold _12__ seconds keeping hips level. Repeat with other leg. _2__ reps per set, _2__ sets per day         PT Education - 11/21/16 1320    Education provided  Yes    Education Details  positioning during sleeping, exercises for R hip and balance    Person(s) Educated  Patient;Spouse    Methods  Explanation;Demonstration;Handout    Comprehension  Verbalized understanding;Returned demonstration          PT Long Term Goals - 11/14/16 1852      PT LONG TERM GOAL #1   Title  Patient demonstrates & verbalizes understanding of ongoing HEP / fitness plan. (All LTGs Target Date: 12/21/2016)    Time  4    Period  Weeks    Status  New    Target Date  12/21/16      PT LONG TERM GOAL #2   Title  6-Minute Walk Test >1320'     Time  4    Period  Weeks    Status  New    Target Date  12/21/16      PT LONG TERM GOAL #3   Title   Functional Gait Assessment >19/30    Time  4    Period  Weeks    Status  New    Target Date  12/21/16      PT LONG TERM GOAL #4   Title  Patient ambulates 1000' outdoors including grass, ramps, curbs without device independently.     Time  4    Period  Weeks  Status  New    Target Date  12/21/16      PT LONG TERM GOAL #5   Title  Patient reports right hip pain </= 2/10    Time  4    Period  Weeks    Status  New    Target Date  12/21/16            Plan - 11/21/16 1321    Clinical Impression Statement  Treatment session today focused on initiating HEP for stretching R hip IT Band and piriformis muscles, strengthening gluteal muscles, fascial release of R IT band and recommendations for positioning while sleeping to maintain neutral hip rotation when sleeping on his side.  Also performed gluteal strengthening and balance training in standing.  Pt tolerated well and fascial release observed by wife in order to perform at home.  Will continue to assess and progress as pt tolerates.    Rehab Potential  Good    Clinical Impairments Affecting Rehab Potential  A-Fib (new diagnosis), HTN, s/p AVR, CAD, Gout, left knee arthroscopy, borderline DM    PT Frequency  2x / week    PT Duration  4 weeks    PT Treatment/Interventions  ADLs/Self Care Home Management;DME Instruction;Gait training;Stair training;Functional mobility training;Therapeutic activities;Therapeutic exercise;Balance training;Neuromuscular re-education;Patient/family education;Moist Heat;Ultrasound;Manual techniques;Vestibular    PT Next Visit Plan  how is sleeping positiong, exercises affecting R hip bursitis pain?  continue to progress LE strengthening and balance, L NMR    Consulted and Agree with Plan of Care  Patient;Family member/caregiver    Family Member Consulted  wife       Patient will benefit from skilled therapeutic intervention in order to improve the following deficits and impairments:  Abnormal gait,  Decreased activity tolerance, Decreased balance, Decreased endurance, Decreased strength, Pain, Postural dysfunction, Decreased mobility  Visit Diagnosis: Pain in right hip  Hemiplegia and hemiparesis following cerebral infarction affecting left non-dominant side (HCC)  Muscle weakness (generalized)  Unsteadiness on feet     Problem List Patient Active Problem List   Diagnosis Date Noted  . PAF (paroxysmal atrial fibrillation) ([place])   . Hyperglycemia   . Right middle cerebral artery stroke ([place]) 10/29/2016  . Acute embolic stroke ([place])   . Hypokalemia   . Left hemiparesis ([place])   . Dysphagia, post-stroke   . S/P AVR (aortic valve replacement)   . Benign essential HTN   . Diabetes mellitus type 2 in nonobese (HCC)   . Benign prostatic hyperplasia   . A-fib ([place]) 10/28/2016  . CVA (cerebral vascular accident) ([place]) 10/24/2016  . HTN (hypertension)   . Gout   . History of small bowel obstruction   . History of BPH   . S/P AVR 08/03/2010    Rico Junker, PT, DPT 11/21/16    1:51 PM    [facility] [address] [place], [place], [address] Phone: [phone]   Fax:  [phone]  Name: [name] MRN: [id number] Date of Birth: August 20, 1935

## 2016-11-21 NOTE — Therapy (Signed)
[facility] [address] [place], [place], [address] Phone: [phone]   Fax:  [phone]  Occupational Therapy Treatment  Patient Details  Name: [name] MRN: [id number] Date of Birth: 1935-08-13 Referring Provider: Dr. Alysia Penna   Encounter Date: 11/21/2016  OT End of Session - 11/21/16 0945    Visit Number  2    Number of Visits  9    Date for OT Re-Evaluation  12/14/16    Authorization Type  [employer] Medicare, no visit limit/no auth; G-code needed    Authorization Time Period  cert. date 11/14/16-01/13/2017    Authorization - Visit Number  2    Authorization - Number of Visits  10    OT Start Time  [phone]    OT Stop Time  0930    OT Time Calculation (min)  38 min    Activity Tolerance  Patient tolerated treatment well    Behavior During Therapy  [facility] for tasks assessed/performed       Past Medical History:  Diagnosis Date  . Bladder stones   . Borderline diabetes   . BPH (benign prostatic hyperplasia)   . Coronary artery disease    cardiologist-  dr Nat Math gerhart NP--- per cath 06-02-2010 non-obstructive cad pLAD 30-40%  . Diverticulosis of colon   . DOE (dyspnea on exertion)   . Gout   . History of adenomatous polyp of colon    2002-- tubular adenoma  . History of aortic insufficiency    severe -- s/p  AVR 08-03-2010  . History of basal cell carcinoma (BCC) excision    10/ 2015  left ear  . History of small bowel obstruction    02/ 2007 mechanical sbo s/p  surgical intervention;  partial sbo 09/ 2011 and 03-20-2011 resolved without surgical intervention  . History of squamous cell carcinoma excision    2014 right foot  . History of urinary retention   . HTN (hypertension)   . Peripheral neuropathy   . S/P aortic valve replacement with prosthetic valve 08/03/2010   tissue valve  . S/P patent foramen ovale closure 08/03/2010   at same time AVR    Past Surgical History:   Procedure Laterality Date  . CARDIAC CATHETERIZATION  06-02-2010  dr Marlou Porch   non-obstructive cad- pLAD 30-40%/  normal LVSF/  severe AI  . CARDIOVASCULAR STRESS TEST  04/12/2016   Low risk nuclear perfusion study w/ no significant reversible ischemia/  normal LV function and wall motion ,  stress ef 60%/  58mm inferior and lateral scooped ST-segment depression w/ exercise (may be repolarization abnormality), exercise capacity was moderately reduced  . CATARACT EXTRACTION W/ INTRAOCULAR LENS  IMPLANT, BILATERAL  02/2010  . EXPLORATORY LAPARTOMY /  CHOLECYSTECTOMY  02/28/2005   for Small  bowel obstruction (mechnical)  . IR ANGIO EXTRACRAN SEL COM CAROTID INNOMINATE UNI L MOD SED  10/24/2016  . IR ANGIO VERTEBRAL SEL SUBCLAVIAN INNOMINATE BILAT MOD SED  10/24/2016  . IR PERCUTANEOUS ART THROMBECTOMY/INFUSION INTRACRANIAL INC DIAG ANGIO  10/24/2016  . LEFT KNEE ARTHROSCOPY  2006  . RIGHT FOOT SURGERY    . RIGHT MINIATURE ANTERIOR THORACOTOMY FOR AORTIC VALVE REPLACEMENT AND CLOSURE PATENT FORAMEN OVALE  08-03-2010  DR Levada Schilling Magna-ease pericardial tissue valve (57mm)  . TRANSTHORACIC ECHOCARDIOGRAM  05/30/2016  dr skains   moderate  LVH ef 60-65%/  bioprothesis aortic valve present ,normal grandient and no AI /  mild MV calcification , moderate  MR /  mild PR/ moderate TR/  PASP 49mmHg/ (RA denisty was identified 04-27-2016 echo) and is seen again today, this is likely a promient eustacian ridge, atrium is normal size    There were no vitals filed for this visit.                OT Treatments/Exercises (OP) - 11/21/16 0001      ADLs   ADL Comments  Reviewed long term goals with patient and wife.  Neither feels that patient is experiencing visual changes with recent stroke.  Patient slightly concerned regarding his ability to complete mind games/ puzzles. Patient encourgaed to continue to engage brain, and discussed options like Lumosity for additional challenges.  Feel  patient is experiencing overall dulled response currently.        Neurological Re-education Exercises   Other Exercises 1  Initiated HEP to address shoulder and core strengthening, see patient instructions.  Patient needed cueing for breathing with effort.  Patient encouraged to exercise in front of mirror to monitor technique for dumb bell exercises.  Wife able to identify poor form, and correct.  Patient cautioned against working with poor mechanics, and to stop any exercise that caused discomfort.               OT Education - 11/21/16 0944    Education provided  Yes    Education Details  Initiated strengthening program for [employer]) Educated  Patient;Spouse    Methods  Explanation;Demonstration    Comprehension  Verbalized understanding;Returned demonstration;Need further instruction          OT Long Term Goals - 11/21/16 0947      OT LONG TERM GOAL #1   Title  Pt will be independent with HEP for LUE strength.--check LTGs 12/14/16    Status  On-going      OT LONG TERM GOAL #2   Title  Pt will perfom simple-mod complex environmental scanning with at least 90% accuracy in prep for return to driving.    Status  On-going      OT LONG TERM GOAL #3   Title  Pt will demo safety and endurance to be able to return to previous yardwork.      Status  On-going            Plan - 11/21/16 0945    Clinical Impression Statement  Patient was very active prior to this stroke, and has good exercise habits.  Patient eager to return to prior level of functioning.      Rehab Potential  Good    OT Frequency  2x / week    OT Duration  4 weeks    OT Treatment/Interventions  Self-care/ADL training;Patient/family education;Balance training;Therapeutic exercises;Therapeutic activities;Cognitive remediation/compensation;Visual/perceptual remediation/compensation;Energy conservation;Functional Mobility Training;Neuromuscular education    Plan  activity tolerance, core strengthening,  check HEP - especially overhead with weights for form, theraband    OT Home Exercise Plan  Initiated strengthening    Consulted and Agree with Plan of Care  Patient;Family member/caregiver    Family Member Consulted  wife       Patient will benefit from skilled therapeutic intervention in order to improve the following deficits and impairments:  Decreased cognition, Impaired vision/preception, Decreased activity tolerance, Decreased endurance, Decreased strength, Decreased balance  Visit Diagnosis: Hemiplegia and hemiparesis following cerebral infarction affecting left non-dominant side (HCC)  Muscle weakness (generalized)  Unsteadiness on feet  Attention and concentration deficit  Visuospatial deficit    Problem List  Patient Active Problem List   Diagnosis Date Noted  . PAF (paroxysmal atrial fibrillation) ([place])   . Hyperglycemia   . Right middle cerebral artery stroke ([place]) 10/29/2016  . Acute embolic stroke ([place])   . Hypokalemia   . Left hemiparesis ([place])   . Dysphagia, post-stroke   . S/P AVR (aortic valve replacement)   . Benign essential HTN   . Diabetes mellitus type 2 in nonobese (HCC)   . Benign prostatic hyperplasia   . A-fib ([place]) 10/28/2016  . CVA (cerebral vascular accident) ([place]) 10/24/2016  . HTN (hypertension)   . Gout   . History of small bowel obstruction   . History of BPH   . S/P AVR 08/03/2010    Mariah Milling, OTR/L 11/21/2016, 9:50 AM  [facility] [address] [place], [place], [address] Phone: [phone]   Fax:  [phone]  Name: [name] MRN: [id number] Date of Birth: 17-Sep-1935

## 2016-11-27 ENCOUNTER — Other Ambulatory Visit: Payer: Self-pay

## 2016-11-27 ENCOUNTER — Ambulatory Visit: Payer: Medicare Other | Admitting: Physical Medicine & Rehabilitation

## 2016-11-27 ENCOUNTER — Encounter: Payer: Self-pay | Admitting: Physical Medicine & Rehabilitation

## 2016-11-27 ENCOUNTER — Encounter: Payer: Medicare Other | Attending: Physical Medicine & Rehabilitation

## 2016-11-27 VITALS — BP 131/84 | HR 61

## 2016-11-27 DIAGNOSIS — Z8249 Family history of ischemic heart disease and other diseases of the circulatory system: Secondary | ICD-10-CM | POA: Diagnosis not present

## 2016-11-27 DIAGNOSIS — I10 Essential (primary) hypertension: Secondary | ICD-10-CM | POA: Diagnosis not present

## 2016-11-27 DIAGNOSIS — Z8673 Personal history of transient ischemic attack (TIA), and cerebral infarction without residual deficits: Secondary | ICD-10-CM | POA: Insufficient documentation

## 2016-11-27 DIAGNOSIS — Z8589 Personal history of malignant neoplasm of other organs and systems: Secondary | ICD-10-CM | POA: Diagnosis not present

## 2016-11-27 DIAGNOSIS — Z808 Family history of malignant neoplasm of other organs or systems: Secondary | ICD-10-CM | POA: Insufficient documentation

## 2016-11-27 DIAGNOSIS — Z9049 Acquired absence of other specified parts of digestive tract: Secondary | ICD-10-CM | POA: Diagnosis not present

## 2016-11-27 DIAGNOSIS — M7061 Trochanteric bursitis, right hip: Secondary | ICD-10-CM | POA: Insufficient documentation

## 2016-11-27 DIAGNOSIS — G629 Polyneuropathy, unspecified: Secondary | ICD-10-CM | POA: Diagnosis not present

## 2016-11-27 DIAGNOSIS — Z87442 Personal history of urinary calculi: Secondary | ICD-10-CM | POA: Diagnosis not present

## 2016-11-27 DIAGNOSIS — Z9889 Other specified postprocedural states: Secondary | ICD-10-CM | POA: Diagnosis not present

## 2016-11-27 NOTE — Progress Notes (Signed)
Subjective:    Patient ID: [name], male    DOB: Nov 16, 1935, 81 y.o.   MRN: [id number] 81 year old right-handed male, history of borderline diabetes mellitus, CAD with aortic valve replacement in 2012.  Lives with spouse, independent prior to admission.  Presented on October 24, 2016, with altered mental status and aphasia with right gaze deviation.  CT of the head showed right basal ganglia infarction.  MRI, acute infarction, primarily right basal ganglia.  Acute infarction also in the insula on the right and scattered tiny areas of acute infarction of the cerebral cortex bilaterally, right greater than left.  CT angio of the head and neck showed right M1 occlusion extending into both superior and inferior M2 branches.    HPI Patient attended inpatient rehabilitation from 10/29/2016 to 11/05/2016.  He was discharged home at a modified independent to supervision level. He is modified independent with all his dressing and bathing.  He is ambulating without assistive device.  He is going to outpatient therapy. He is working on higher level activities such as weight training using dumbbells, doing kneeling push-ups. He has had no falls He has followed up with his primary doctor. He has complaints of right hip pain.  This pain occurs when he lays on the right side more so than when he lays on the left side.  He has had no falls or trauma to that area.  He has a previous history of bursitis and has received a corticosteroid injection approximately 5 months ago from his primary care physician.  This gave him several months of relief. We discussed that he is not a good candidate for nonsteroidal anti-inflammatories given that he is on a anticoagulant.  Pain Inventory Average Pain 0 Pain Right Now 0 My pain is no pain  In the last 24 hours, has pain interfered with the following? General activity 0 Relation with others 0 Enjoyment of life 0 What TIME of day is your pain at its  worst? no pain Sleep (in general) Fair  Pain is worse with: no pain Pain improves with: no pain Relief from Meds: no pain  Mobility walk without assistance how many minutes can you walk? 20 ability to climb steps?  yes do you drive?  yes transfers alone  Function retired Do you have any goals in this area?  yes  Neuro/Psych No problems in this area  Prior Studies Any changes since last visit?  no  Physicians involved in your care Any changes since last visit?  no   Family History  Problem Relation Age of Onset  . Heart disease Mother   . Brain cancer Father    Social History   Socioeconomic History  . Marital status: Married    Spouse name: None  . Number of children: 2  . Years of education: None  . Highest education level: None  Social Needs  . Financial resource strain: None  . Food insecurity - worry: None  . Food insecurity - inability: None  . Transportation needs - medical: None  . Transportation needs - non-medical: None  Occupational History  . Occupation: RETIRED  Tobacco Use  . Smoking status: Never Smoker  . Smokeless tobacco: Never Used  Substance and Sexual Activity  . Alcohol use: Yes    Comment: ONE OR TWO PER MONTH  . Drug use: No  . Sexual activity: None  Other Topics Concern  . None  Social History Narrative  . None   Past Surgical History:  Procedure Laterality  Date  . CARDIAC CATHETERIZATION  06-02-2010  dr Marlou Porch   non-obstructive cad- pLAD 30-40%/  normal LVSF/  severe AI  . CARDIOVASCULAR STRESS TEST  04/12/2016   Low risk nuclear perfusion study w/ no significant reversible ischemia/  normal LV function and wall motion ,  stress ef 60%/  22mm inferior and lateral scooped ST-segment depression w/ exercise (may be repolarization abnormality), exercise capacity was moderately reduced  . CATARACT EXTRACTION W/ INTRAOCULAR LENS  IMPLANT, BILATERAL  02/2010  . CYSTOSCOPY WITH LITHOLAPAXY and fulgarization of bladder neck N/A  06/05/2016   Performed by Irine Seal, MD at [facility]  . EXPLORATORY LAPARTOMY /  CHOLECYSTECTOMY  02/28/2005   for Small  bowel obstruction (mechnical)  . IR ANGIO EXTRACRAN SEL COM CAROTID INNOMINATE UNI L MOD SED  10/24/2016  . IR ANGIO VERTEBRAL SEL SUBCLAVIAN INNOMINATE BILAT MOD SED  10/24/2016  . IR PERCUTANEOUS ART THROMBECTOMY/INFUSION INTRACRANIAL INC DIAG ANGIO  10/24/2016  . LEFT KNEE ARTHROSCOPY  2006  . RADIOLOGY WITH ANESTHESIA N/A 10/24/2016   Performed by Luanne Bras, MD at [facility]    . RIGHT MINIATURE ANTERIOR THORACOTOMY FOR AORTIC VALVE REPLACEMENT AND CLOSURE PATENT FORAMEN OVALE  08-03-2010  DR Levada Schilling Magna-ease pericardial tissue valve (50mm)  . TRANSTHORACIC ECHOCARDIOGRAM  05/30/2016  dr skains   moderate  LVH ef 60-65%/  bioprothesis aortic valve present ,normal grandient and no AI /  mild MV calcification , moderate MR /  mild PR/ moderate TR/  PASP 83mmHg/ (RA denisty was identified 04-27-2016 echo) and is seen again today, this is likely a promient eustacian ridge, atrium is normal size   Past Medical History:  Diagnosis Date  . Bladder stones   . Borderline diabetes   . BPH (benign prostatic hyperplasia)   . Coronary artery disease    cardiologist-  dr Nat Math gerhart NP--- per cath 06-02-2010 non-obstructive cad pLAD 30-40%  . Diverticulosis of colon   . DOE (dyspnea on exertion)   . Gout   . History of adenomatous polyp of colon    2002-- tubular adenoma  . History of aortic insufficiency    severe -- s/p  AVR 08-03-2010  . History of basal cell carcinoma (BCC) excision    10/ 2015  left ear  . History of small bowel obstruction    02/ 2007 mechanical sbo s/p  surgical intervention;  partial sbo 09/ 2011 and 03-20-2011 resolved without surgical intervention  . History of squamous cell carcinoma excision    2014 right foot  . History of urinary retention   . HTN (hypertension)   . Peripheral  neuropathy   . S/P aortic valve replacement with prosthetic valve 08/03/2010   tissue valve  . S/P patent foramen ovale closure 08/03/2010   at same time AVR   BP 131/84   Pulse 61   SpO2 97%   Opioid Risk Score:   Fall Risk Score:  `1  Depression screen PHQ 2/9  Depression screen [facility] 2/9 11/27/2016 11/27/2016  Decreased Interest 0 0  Down, Depressed, Hopeless 0 0  PHQ - 2 Score 0 0  Altered sleeping 0 -  Tired, decreased energy 0 -  Change in appetite 0 -  Feeling bad or failure about yourself  0 -  Trouble concentrating 1 -  Moving slowly or fidgety/restless 0 -  Suicidal thoughts 0 -  PHQ-9 Score 1 -  Difficult doing work/chores Somewhat difficult -  Review of Systems  Constitutional: Negative.   HENT: Negative.   Eyes: Negative.   Respiratory: Negative.   Cardiovascular: Negative.   Gastrointestinal: Negative.   Endocrine: Negative.   Genitourinary: Negative.   Musculoskeletal: Negative.   Skin: Negative.   Allergic/Immunologic: Negative.   Neurological: Negative.   Hematological: Negative.   Psychiatric/Behavioral: Negative.        Objective:   Physical Exam  Constitutional: He is oriented to person, place, and time. He appears well-developed and well-nourished.  HENT:  Head: Normocephalic and atraumatic.  Eyes: Conjunctivae and EOM are normal. Pupils are equal, round, and reactive to light.  Neurological: He is alert and oriented to person, place, and time.  Skin: Skin is warm and dry.  Nursing note and vitals reviewed.    Examination General no acute distress mood and affect appropriate Motor strength is 5/5 bilateral deltoid bicep tricep grip hip flexor knee extensor ankle was flexor Gait is without evidence of toe drag or knee instability Right hip has tenderness to palpation along the greater trochanter. No pain with hip internal/external rotation. Negative straight leg raising    Assessment & Plan:  #1.  History of CVA no significant  sequela .  Do not think he will need long-term outpatient rehabilitation. We discussed that he should be ready to get back to driving although he does not feel quite ready himself yet.  We discussed graduated return and driving.  2.  Right hip trochanteric bursitis.  We discussed treatment options.  He should do his stretching.  I did instruct him how to stretch he was not clear on some of his exercises from physical therapy. We will also perform right hip hip trochanteric bursa injection.  RIGHT Trochanteric bursa injection  without ultrasound guidance  Indication Trochanteric bursitis. Exam has tenderness over the greater trochanter of the hip. Pain has not responded to conservative care such as exercise therapy and oral medications. Pain interferes with sleep or with mobility Informed consent was obtained after describing risks and benefits of the procedure with the patient these include bleeding bruising and infection. Patient has signed written consent form. Patient placed in a lateral decubitus position with the affected hip superior. Point of maximal pain was palpated marked and prepped with Betadine and entered with a needle to bone contact. Needle slightly withdrawn then 6mg  of betamethasone with 4 cc 1% lidocaine were injected. Patient tolerated procedure well. Post procedure instructions given.

## 2016-11-27 NOTE — Patient Instructions (Signed)
May drive                      Trochanteric Bursitis Trochanteric bursitis is a condition that causes hip pain. Trochanteric bursitis happens when fluid-filled sacs (bursae) in the hip get irritated. Normally these sacs absorb shock and help strong bands of tissue (tendons) in your hip glide smoothly over each other and over your hip bones. What are the causes? This condition results from increased friction between the hip bones and the tendons that go over them. This condition can happen if you:  Have weak hips.  Use your hip muscles too much (overuse).  Get hit in the hip.  What increases the risk? This condition is more likely to develop in:  Women.  Adults who are middle-aged or older.  People with arthritis or a spinal condition.  People with weak buttocks muscles (gluteal muscles).  People who have one leg that is shorter than the other.  People who participate in certain kinds of athletic activities, such as: ? Running sports, especially long-distance running. ? Contact sports, like football or martial arts. ? Sports in which falls may occur, like skiing.  What are the signs or symptoms? The main symptom of this condition is pain and tenderness over the point of your hip. The pain may be:  Sharp and intense.  Dull and achy.  Felt on the outside of your thigh.  It may increase when you:  Lie on your side.  Walk or run.  Go up on stairs.  Sit.  Stand up after sitting.  Stand for long periods of time.  How is this diagnosed? This condition may be diagnosed based on:  Your symptoms.  Your medical history.  A physical exam.  Imaging tests, such as: ? X-rays to check your bones. ? An MRI or ultrasound to check your tendons and muscles.  During your physical exam, your health care provider will check the movement and strength of your hip. He or she may press on the point of your hip to check for pain. How is this treated? This condition may be  treated by:  Resting.  Reducing your activity.  Avoiding activities that cause pain.  Using crutches, a cane, or a walker to decrease the strain on your hip.  Taking medicine to help with swelling.  Having medicine injected into the bursae to help with swelling.  Using ice, heat, and massage therapy for pain relief.  Physical therapy exercises for strength and flexibility.  Surgery (rare).  Follow these instructions at home: Activity  Rest.  Avoid activities that cause pain.  Return to your normal activities as told by your health care provider. Ask your health care provider what activities are safe for you. Managing pain, stiffness, and swelling  Take over-the-counter and prescription medicines only as told by your health care provider.  If directed, apply heat to the injured area as told by your health care provider. ? Place a towel between your skin and the heat source. ? Leave the heat on for 20-30 minutes. ? Remove the heat if your skin turns bright red. This is especially important if you are unable to feel pain, heat, or cold. You may have a greater risk of getting burned.  If directed, apply ice to the injured area: ? Put ice in a plastic bag. ? Place a towel between your skin and the bag. ? Leave the ice on for 20 minutes, 2-3 times a day. General instructions  If the affected leg is one that you use for driving, ask your health care provider when it is safe to drive.  Use crutches, a cane, or a walker as told by your health care provider.  If one of your legs is shorter than the other, get fitted for a shoe insert.  Lose weight if you are overweight. How is this prevented?  Wear supportive footwear that is appropriate for your sport.  If you have hip pain, start any new exercise or sport slowly.  Maintain physical fitness, including: ? Strength. ? Flexibility. Contact a health care provider if:  Your pain does not improve with 2-4 weeks. Get  help right away if:  You develop severe pain.  You have a fever.  You develop increased redness over your hip.  You have a change in your bowel function or bladder function.  You cannot control the muscles in your feet. This information is not intended to replace advice given to you by your health care provider. Make sure you discuss any questions you have with your health care provider. Document Released: 02/02/2004 Document Revised: 08/31/2015 Document Reviewed: 12/10/2014 Elsevier Interactive Patient Education  [employer].

## 2016-12-03 ENCOUNTER — Ambulatory Visit: Payer: Medicare Other | Admitting: Physical Therapy

## 2016-12-03 ENCOUNTER — Ambulatory Visit: Payer: Medicare Other | Admitting: Occupational Therapy

## 2016-12-03 ENCOUNTER — Encounter: Payer: Self-pay | Admitting: Occupational Therapy

## 2016-12-03 ENCOUNTER — Encounter: Payer: Self-pay | Admitting: Physical Therapy

## 2016-12-03 DIAGNOSIS — M25551 Pain in right hip: Secondary | ICD-10-CM

## 2016-12-03 DIAGNOSIS — M6281 Muscle weakness (generalized): Secondary | ICD-10-CM

## 2016-12-03 DIAGNOSIS — I639 Cerebral infarction, unspecified: Secondary | ICD-10-CM | POA: Diagnosis not present

## 2016-12-03 DIAGNOSIS — I69354 Hemiplegia and hemiparesis following cerebral infarction affecting left non-dominant side: Secondary | ICD-10-CM

## 2016-12-03 DIAGNOSIS — R41840 Attention and concentration deficit: Secondary | ICD-10-CM

## 2016-12-03 DIAGNOSIS — R41842 Visuospatial deficit: Secondary | ICD-10-CM

## 2016-12-03 DIAGNOSIS — R2681 Unsteadiness on feet: Secondary | ICD-10-CM

## 2016-12-03 NOTE — Therapy (Signed)
[facility] [address] [place], [place], [address] Phone: [phone]   Fax:  [phone]  Occupational Therapy Treatment  Patient Details  Name: [name] MRN: [id number] Date of Birth: [birth year] Referring Provider: Dr. Alysia Penna   Encounter Date: 12/03/2016  OT End of Session - 12/03/16 0921    Visit Number  3    Number of Visits  9    Date for OT Re-Evaluation  12/14/16    Authorization Type  [employer] Medicare, no visit limit/no auth; G-code needed    Authorization Time Period  cert. date 11/14/16-01/13/2017    Authorization - Visit Number  3    Authorization - Number of Visits  10    OT Start Time  0930    OT Stop Time  1015    OT Time Calculation (min)  45 min    Activity Tolerance  Patient tolerated treatment well    Behavior During Therapy  WFL for tasks assessed/performed       Past Medical History:  Diagnosis Date  . Bladder stones   . Borderline diabetes   . BPH (benign prostatic hyperplasia)   . Coronary artery disease    cardiologist-  dr Nat Math gerhart NP--- per cath 06-02-2010 non-obstructive cad pLAD 30-40%  . Diverticulosis of colon   . DOE (dyspnea on exertion)   . Gout   . History of adenomatous polyp of colon    2002-- tubular adenoma  . History of aortic insufficiency    severe -- s/p  AVR 08-03-2010  . History of basal cell carcinoma (BCC) excision    10/ 2015  left ear  . History of small bowel obstruction    02/ 2007 mechanical sbo s/p  surgical intervention;  partial sbo 09/ 2011 and 03-20-2011 resolved without surgical intervention  . History of squamous cell carcinoma excision    2014 right foot  . History of urinary retention   . HTN (hypertension)   . Peripheral neuropathy   . S/P aortic valve replacement with prosthetic valve 08/03/2010   tissue valve  . S/P patent foramen ovale closure 08/03/2010   at same time AVR    Past Surgical History:   Procedure Laterality Date  . CARDIAC CATHETERIZATION  06-02-2010  dr Marlou Porch   non-obstructive cad- pLAD 30-40%/  normal LVSF/  severe AI  . CARDIOVASCULAR STRESS TEST  04/12/2016   Low risk nuclear perfusion study w/ no significant reversible ischemia/  normal LV function and wall motion ,  stress ef 60%/  33mm inferior and lateral scooped ST-segment depression w/ exercise (may be repolarization abnormality), exercise capacity was moderately reduced  . CATARACT EXTRACTION W/ INTRAOCULAR LENS  IMPLANT, BILATERAL  02/2010  . CYSTOSCOPY WITH LITHOLAPAXY N/A 06/05/2016   Procedure: CYSTOSCOPY WITH LITHOLAPAXY and fulgarization of bladder neck;  Surgeon: Irine Seal, MD;  Location: [facility];  Service: Urology;  Laterality: N/A;  . EXPLORATORY LAPARTOMY /  CHOLECYSTECTOMY  02/28/2005   for Small  bowel obstruction (mechnical)  . IR ANGIO EXTRACRAN SEL COM CAROTID INNOMINATE UNI L MOD SED  10/24/2016  . IR ANGIO VERTEBRAL SEL SUBCLAVIAN INNOMINATE BILAT MOD SED  10/24/2016  . IR PERCUTANEOUS ART THROMBECTOMY/INFUSION INTRACRANIAL INC DIAG ANGIO  10/24/2016  . LEFT KNEE ARTHROSCOPY  2006  . RADIOLOGY WITH ANESTHESIA N/A 10/24/2016   Procedure: RADIOLOGY WITH ANESTHESIA;  Surgeon: Luanne Bras, MD;  Location: [facility];  Service: Radiology;  Laterality: N/A;  . RIGHT FOOT SURGERY    .  RIGHT MINIATURE ANTERIOR THORACOTOMY FOR AORTIC VALVE REPLACEMENT AND CLOSURE PATENT FORAMEN OVALE  08-03-2010  DR Levada Schilling Magna-ease pericardial tissue valve (71mm)  . TRANSTHORACIC ECHOCARDIOGRAM  05/30/2016  dr skains   moderate  LVH ef 60-65%/  bioprothesis aortic valve present ,normal grandient and no AI /  mild MV calcification , moderate MR /  mild PR/ moderate TR/  PASP 53mmHg/ (RA denisty was identified 04-27-2016 echo) and is seen again today, this is likely a promient eustacian ridge, atrium is normal size    There were no vitals filed for this visit.  Subjective Assessment -  12/03/16 0937    Subjective   got a stomach bug Thanksgiving and wasn't able to do much exercise    Patient is accompained by:  Family member wife    Pertinent History  a-fib, HTN, boarderline DM, CAD, aortic valve replacement, peripheral neuropathy, gout, diverticulosis, s/p patent foramen ovale closure     Limitations  fall risk, no driving    Patient Stated Goals  improve strength and endurance    Currently in Pain?  No/denies    Pain Onset  More than a month ago       Simple environmental scanning with 14/15 items found (minimally distracting environment).                     OT Education - 12/03/16 1023    Education Details  Reviewed HEP and made notes/modifications and added red theraband ex to HEP--see pt instructions    Person(s) Educated  Patient;Spouse    Methods  Explanation;Demonstration;Verbal cues;Handout    Comprehension  Verbalized understanding;Returned demonstration;Verbal cues required min cueing for technique and to avoid compensation   min cueing for technique and to avoid compensation         OT Long Term Goals - 11/21/16 0947      OT LONG TERM GOAL #1   Title  Pt will be independent with HEP for LUE strength.--check LTGs 12/14/16    Status  On-going      OT LONG TERM GOAL #2   Title  Pt will perfom simple-mod complex environmental scanning with at least 90% accuracy in prep for return to driving.    Status  On-going      OT LONG TERM GOAL #3   Title  Pt will demo safety and endurance to be able to return to previous yardwork.      Status  On-going            Plan - 12/03/16 1020    Clinical Impression Statement  Pt reports stomach virus last week affecting ability to perform HEP and making him still feel weak.  Pt is progressing towards goals.  However, pt needs min cueing for L elbow ext during shoulder exercises.    Rehab Potential  Good    OT Frequency  2x / week    OT Duration  4 weeks    OT Treatment/Interventions   Self-care/ADL training;Patient/family education;Balance training;Therapeutic exercises;Therapeutic activities;Cognitive remediation/compensation;Visual/perceptual remediation/compensation;Energy conservation;Functional Mobility Training;Neuromuscular education    Plan  activity tolerance, core strength, check HEP prn.    OT Home Exercise Plan  Initiated strengthening    Consulted and Agree with Plan of Care  Patient;Family member/caregiver    Family Member Consulted  wife       Patient will benefit from skilled therapeutic intervention in order to improve the following deficits and impairments:  Decreased cognition, Impaired vision/preception, Decreased activity tolerance, Decreased endurance,  Decreased strength, Decreased balance  Visit Diagnosis: Hemiplegia and hemiparesis following cerebral infarction affecting left non-dominant side (HCC)  Muscle weakness (generalized)  Unsteadiness on feet  Attention and concentration deficit  Visuospatial deficit    Problem List Patient Active Problem List   Diagnosis Date Noted  . Trochanteric bursitis of right hip 11/27/2016  . History of ischemic left MCA stroke 11/27/2016  . PAF (paroxysmal atrial fibrillation) ([place])   . Hyperglycemia   . Right middle cerebral artery stroke ([place]) 10/29/2016  . Acute embolic stroke ([place])   . Hypokalemia   . Left hemiparesis ([place])   . Dysphagia, post-stroke   . S/P AVR (aortic valve replacement)   . Benign essential HTN   . Diabetes mellitus type 2 in nonobese (HCC)   . Benign prostatic hyperplasia   . A-fib ([place]) 10/28/2016  . CVA (cerebral vascular accident) ([place]) 10/24/2016  . HTN (hypertension)   . Gout   . History of small bowel obstruction   . History of BPH   . S/P AVR 08/03/2010    [name] 12/03/2016, 10:25 AM  [facility] [address] [place], [place], [address] Phone: [phone]   Fax:  [phone]  Name:  [name] MRN: [id number] Date of Birth: 1935-09-12   Vianne Bulls, OTR/L [facility] [address] [place], [place]  [address] [phone] phone [phone] 12/03/16 10:25 AM

## 2016-12-03 NOTE — Patient Instructions (Addendum)
     Pushups:  Push ups on knees x 10 reps  Plank on forearms and toes:  Keep body straight, with emphasis on pushing forearms down into floor, and lifting chest up.  Rest, repeat x 3.  5 lb dumbbells Seated or standing- raise both arms up toward each other overhead, Just to shoulder height.  Keep left elbow straight.  10 reps in front of mirror.  Rest, repeat x 2-3 (work up to this slowly).  Pay attention to form.  Upright row:  5 lbs.  X 10 reps, rest repeat X 3  Bicep curls:  10lb weight x 10reps; rest, repeat x 3  Speed bag for up to 5 minutes with emphasis on accuracy with left hand contact.      FLEXION: Standing - Stable: Resistance Band (Active)    Stand with right arm at side. Against red resistance band, lift arm forward and up as high as possible, keeping elbow straight. Complete 2 sets of 10 repetitions.   Extension (Resistive Band)    With band looped around hand and wrist, use elbow movements only. Using other arm as anchor, straighten elbow, pushing down. Hold ____ seconds. Repeat 10 times. Do 2 sets.      Strengthening: Resisted Extension   Attach one end to door.  Hold tubing in one hand, arm forward. Pull arm back, elbow straight. Repeat 10 times per set. Do 2 sets.   Resisted Horizontal Abduction: Bilateral   Sit or stand, tubing in both hands, arms out in front. Keeping arms straight, pinch shoulder blades together and stretch arms out. Repeat 15 times per set.  Do 1-2 sessions per day.     **REPEAT ALL WITH BOTH ARMS.

## 2016-12-03 NOTE — Therapy (Signed)
[facility] [address] [place], [place], [address] Phone: [phone]   Fax:  [phone]  Physical Therapy Treatment  Patient Details  Name: [name] MRN: [id number] Date of Birth: November 18, 1935 Referring Provider: Alysia Penna, MD   Encounter Date: 12/03/2016  PT End of Session - 12/03/16 0939    Visit Number  3    Number of Visits  9    Date for PT Re-Evaluation  12/21/16    Authorization Type  [employer] Medicare G-Code & progress note every 10th visit    PT Start Time  0853    PT Stop Time  0933    PT Time Calculation (min)  40 min    Activity Tolerance  Patient tolerated treatment well    Behavior During Therapy  [facility] for tasks assessed/performed       Past Medical History:  Diagnosis Date  . Bladder stones   . Borderline diabetes   . BPH (benign prostatic hyperplasia)   . Coronary artery disease    cardiologist-  dr Nat Math gerhart NP--- per cath 06-02-2010 non-obstructive cad pLAD 30-40%  . Diverticulosis of colon   . DOE (dyspnea on exertion)   . Gout   . History of adenomatous polyp of colon    2002-- tubular adenoma  . History of aortic insufficiency    severe -- s/p  AVR 08-03-2010  . History of basal cell carcinoma (BCC) excision    10/ 2015  left ear  . History of small bowel obstruction    02/ 2007 mechanical sbo s/p  surgical intervention;  partial sbo 09/ 2011 and 03-20-2011 resolved without surgical intervention  . History of squamous cell carcinoma excision    2014 right foot  . History of urinary retention   . HTN (hypertension)   . Peripheral neuropathy   . S/P aortic valve replacement with prosthetic valve 08/03/2010   tissue valve  . S/P patent foramen ovale closure 08/03/2010   at same time AVR    Past Surgical History:  Procedure Laterality Date  . CARDIAC CATHETERIZATION  06-02-2010  dr Marlou Porch   non-obstructive cad- pLAD 30-40%/  normal LVSF/  severe AI  .  CARDIOVASCULAR STRESS TEST  04/12/2016   Low risk nuclear perfusion study w/ no significant reversible ischemia/  normal LV function and wall motion ,  stress ef 60%/  59mm inferior and lateral scooped ST-segment depression w/ exercise (may be repolarization abnormality), exercise capacity was moderately reduced  . CATARACT EXTRACTION W/ INTRAOCULAR LENS  IMPLANT, BILATERAL  02/2010  . CYSTOSCOPY WITH LITHOLAPAXY N/A 06/05/2016   Procedure: CYSTOSCOPY WITH LITHOLAPAXY and fulgarization of bladder neck;  Surgeon: Irine Seal, MD;  [facility];  Service: Urology;  Laterality: N/A;  . EXPLORATORY LAPARTOMY /  CHOLECYSTECTOMY  02/28/2005   for Small  bowel obstruction (mechnical)  . IR ANGIO EXTRACRAN SEL COM CAROTID INNOMINATE UNI L MOD SED  10/24/2016  . IR ANGIO VERTEBRAL SEL SUBCLAVIAN INNOMINATE BILAT MOD SED  10/24/2016  . IR PERCUTANEOUS ART THROMBECTOMY/INFUSION INTRACRANIAL INC DIAG ANGIO  10/24/2016  . LEFT KNEE ARTHROSCOPY  2006  . RADIOLOGY WITH ANESTHESIA N/A 10/24/2016   Procedure: RADIOLOGY WITH ANESTHESIA;  Surgeon: Luanne Bras, MD;  Location: [facility];  Service: Radiology;  Laterality: N/A;  . RIGHT FOOT SURGERY    . RIGHT MINIATURE ANTERIOR THORACOTOMY FOR AORTIC VALVE REPLACEMENT AND CLOSURE PATENT FORAMEN OVALE  08-03-2010  DR Levada Schilling Magna-ease pericardial tissue  valve (33mm)  . TRANSTHORACIC ECHOCARDIOGRAM  05/30/2016  dr skains   moderate  LVH ef 60-65%/  bioprothesis aortic valve present ,normal grandient and no AI /  mild MV calcification , moderate MR /  mild PR/ moderate TR/  PASP 60mmHg/ (RA denisty was identified 04-27-2016 echo) and is seen again today, this is likely a promient eustacian ridge, atrium is normal size    There were no vitals filed for this visit.  Subjective Assessment - 12/03/16 0857    Subjective  Was not able to sleep with pillow between his knees, has a question about the IT band stretch.  Pain in R hip is better  since injection.  Got sick with stomach virus on Thanksgiving afternoon.  Stayed well hydrated.      Patient is accompained by:  Family member    Pertinent History  A-Fib (new diagnosis), HTN, s/p AVR, CAD, Gout, left knee arthroscopy, borderline DM    Limitations  Standing;Walking    Patient Stated Goals  To improve stamina & strength to return to prior level    Currently in Pain?  No/denies      Reviewed, added and adjusted the following exercises below for HEP:  IT Band: Wall Lean With Crossed Leg    Stand with right hand on wall. Cross right leg behind left leg. Stretch right hip toward wall with other arm supporting trunk. Hold __30_ seconds. Relax. Repeat _2__ times. Do _2__ times a day.   HIP: Abduction - Side-Lying (Weight)    Place weight on top leg. Bend bottom leg.  Squeeze glutes. Raise leg up and slightly back. Hold _1-2__ seconds. Use __3_ lb weight. _12_ reps per side, _2__ sets per day Bend bottom leg to stabilize pelvis.  FLEXION: Side-Lying (Active)    Lie on side, bottom leg bent, top leg straight. Lift top leg slightly: Draw top leg toward chest and then kick the heel back straightening the leg. Use _3__ lbs. Complete __1_ sets of _12__ repetitions each leg.  2 sessions per day.  Copyright  VHI. All rights reserved.  Clam    Lie on side, legs bent 90 wearing 3lb ankle weights. Keep knees touching, lift top foot off bottom foot. Repeat __12__ times. Repeat on other side. Do __2__ sessions per day.   Hip Abduction: Side-Stepping: Monster Steps (Eccentric)    With band around thighs or ankles, step out to right side, walking along counter top. Keep toes pointed forward.  Repeat to Left side walking down countertop  _4__ laps per set, _2__ sets per day   Copyright  VHI. All rights reserved.   SINGLE LIMB STANCE    Hold on for support; Stance: single leg on floor. Raise leg. Hold _12__ seconds keeping hips level. Repeat with other leg. _2__  reps per set, _2__ sets per day       PT Education - 12/03/16 [phone]    Education provided  Yes    Education Details  reviewed HEP and adjusted    Person(s) Educated  Patient;Spouse    Methods  Explanation;Demonstration;Handout    Comprehension  Need further instruction          PT Long Term Goals - 11/14/16 1852      PT LONG TERM GOAL #1   Title  Patient demonstrates & verbalizes understanding of ongoing HEP / fitness plan. (All LTGs Target Date: 12/21/2016)    Time  4    Period  Weeks    Status  New  Target Date  12/21/16      PT LONG TERM GOAL #2   Title  6-Minute Walk Test >1320'     Time  4    Period  Weeks    Status  New    Target Date  12/21/16      PT LONG TERM GOAL #3   Title  Functional Gait Assessment >19/30    Time  4    Period  Weeks    Status  New    Target Date  12/21/16      PT LONG TERM GOAL #4   Title  Patient ambulates 1000' outdoors including grass, ramps, curbs without device independently.     Time  4    Period  Weeks    Status  New    Target Date  12/21/16      PT LONG TERM GOAL #5   Title  Patient reports right hip pain </= 2/10    Time  4    Period  Weeks    Status  New    Target Date  12/21/16            Plan - 12/03/16 2952    Clinical Impression Statement  Treatment session today focused on review of home exercise program due to pt reporting to physician and therapist confusion about stretches, and inability to perform recommended sleeping positions.   Adjusted HEP removing sleeping position and progressing standing hip ABD to include resistance band and adding in sidelying hip ABD and combined hip ABD with flexion<>extension and reverse clams with ankle weights to add in gravity assisted resistance for bilat LE and stability strengthening.  Pt tolerated well and instructed to perform at home on floor with yoga mat to protect hip.  Pt to perform tonight and then review tomorrow if pt continues to have questions.    Rehab  Potential  Good    Clinical Impairments Affecting Rehab Potential  A-Fib (new diagnosis), HTN, s/p AVR, CAD, Gout, left knee arthroscopy, borderline DM    PT Frequency  2x / week    PT Duration  4 weeks    PT Treatment/Interventions  ADLs/Self Care Home Management;DME Instruction;Gait training;Stair training;Functional mobility training;Therapeutic activities;Therapeutic exercise;Balance training;Neuromuscular re-education;Patient/family education;Moist Heat;Ultrasound;Manual techniques;Vestibular    PT Next Visit Plan  review new HEP, L NMR/balance, focus on LLE clearance during gait    Consulted and Agree with Plan of Care  Patient;Family member/caregiver    Family Member Consulted  wife       Patient will benefit from skilled therapeutic intervention in order to improve the following deficits and impairments:  Abnormal gait, Decreased activity tolerance, Decreased balance, Decreased endurance, Decreased strength, Pain, Postural dysfunction, Decreased mobility  Visit Diagnosis: Hemiplegia and hemiparesis following cerebral infarction affecting left non-dominant side (HCC)  Muscle weakness (generalized)  Unsteadiness on feet  Pain in right hip     Problem List Patient Active Problem List   Diagnosis Date Noted  . Trochanteric bursitis of right hip 11/27/2016  . History of ischemic left MCA stroke 11/27/2016  . PAF (paroxysmal atrial fibrillation) ([place])   . Hyperglycemia   . Right middle cerebral artery stroke ([place]) 10/29/2016  . Acute embolic stroke ([place])   . Hypokalemia   . Left hemiparesis ([place])   . Dysphagia, post-stroke   . S/P AVR (aortic valve replacement)   . Benign essential HTN   . Diabetes mellitus type 2 in nonobese (HCC)   . Benign prostatic hyperplasia   . A-fib (  [place]) 10/28/2016  . CVA (cerebral vascular accident) ([place]) 10/24/2016  . HTN (hypertension)   . Gout   . History of small bowel obstruction   . History of BPH   . S/P AVR 08/03/2010   Rico Junker, PT, DPT 12/03/16    9:46 AM    [facility] [address] [place], [place], [address] Phone: [phone]   Fax:  [phone]  Name: [name] MRN: [id number] Date of Birth: Feb 25, 1935

## 2016-12-03 NOTE — Patient Instructions (Signed)
*  USE A ROLLING PIN AND ROLL WITH STEADY PRESSURE DOWN THE SIDE OF THE RIGHT LEG AND BACK UP*  Piriformis Stretch, Supine    Lie supine, right ankle crossed onto left knee. Press right knee down and slide left foot as close to your buttocks as you can tolerate until stretch is felt in buttock of top leg. Hold _30__ seconds.  Repeat _2__ times per session. Do __2_ sessions per day.  IT Band: Wall Lean With Crossed Leg    Stand with right hand on wall. Cross right leg behind left leg. Stretch right hip toward wall with other arm supporting trunk. Hold __30_ seconds. Relax. Repeat _2__ times. Do _2__ times a day.   HIP: Abduction - Side-Lying (Weight)    Place weight on top leg. Bend bottom leg.  Squeeze glutes. Raise leg up and slightly back. Hold _1-2__ seconds. Use __3_ lb weight. _12_ reps per side, _2__ sets per day Bend bottom leg to stabilize pelvis.  FLEXION: Side-Lying (Active)    Lie on side, bottom leg bent, top leg straight. Lift top leg slightly: Draw top leg toward chest and then kick the heel back straightening the leg. Use _3__ lbs. Complete __1_ sets of _12__ repetitions each leg.  2 sessions per day.  Copyright  VHI. All rights reserved.  Clam    Lie on side, legs bent 90 wearing 3lb ankle weights. Keep knees touching, lift top foot off bottom foot. Repeat __12__ times. Repeat on other side. Do __2__ sessions per day.   Hip Abduction: Side-Stepping: Monster Steps (Eccentric)    With band around thighs or ankles, step out to right side, walking along counter top. Keep toes pointed forward.  Repeat to Left side walking down countertop  _4__ laps per set, _2__ sets per day   Copyright  VHI. All rights reserved.   SINGLE LIMB STANCE    Hold on for support; Stance: single leg on floor. Raise leg. Hold _12__ seconds keeping hips level. Repeat with other leg. _2__ reps per set, _2__ sets per day

## 2016-12-04 ENCOUNTER — Ambulatory Visit: Payer: Medicare Other | Admitting: Occupational Therapy

## 2016-12-04 ENCOUNTER — Ambulatory Visit: Payer: Medicare Other | Admitting: Physical Therapy

## 2016-12-04 ENCOUNTER — Encounter: Payer: Self-pay | Admitting: Physical Therapy

## 2016-12-04 ENCOUNTER — Encounter: Payer: Self-pay | Admitting: Occupational Therapy

## 2016-12-04 DIAGNOSIS — R2681 Unsteadiness on feet: Secondary | ICD-10-CM

## 2016-12-04 DIAGNOSIS — I69354 Hemiplegia and hemiparesis following cerebral infarction affecting left non-dominant side: Secondary | ICD-10-CM

## 2016-12-04 DIAGNOSIS — I639 Cerebral infarction, unspecified: Secondary | ICD-10-CM | POA: Diagnosis not present

## 2016-12-04 DIAGNOSIS — R41842 Visuospatial deficit: Secondary | ICD-10-CM

## 2016-12-04 DIAGNOSIS — R41840 Attention and concentration deficit: Secondary | ICD-10-CM

## 2016-12-04 DIAGNOSIS — M6281 Muscle weakness (generalized): Secondary | ICD-10-CM

## 2016-12-04 NOTE — Therapy (Signed)
[facility] [address] [place], [place], [address] Phone: [phone]   Fax:  [phone]  Physical Therapy Treatment  Patient Details  Name: [name] MRN: [id number] Date of Birth: 15-Jan-1935 Referring Provider: Alysia Penna, MD   Encounter Date: 12/04/2016  PT End of Session - 12/04/16 1651    Visit Number  4    Number of Visits  9    Date for PT Re-Evaluation  12/21/16    Authorization Type  [employer]HC Medicare G-Code & progress note every 10th visit    PT Start Time  0934    PT Stop Time  1018    PT Time Calculation (min)  44 min    Activity Tolerance  Patient tolerated treatment well    Behavior During Therapy  [facility] for tasks assessed/performed       Past Medical History:  Diagnosis Date  . Bladder stones   . Borderline diabetes   . BPH (benign prostatic hyperplasia)   . Coronary artery disease    cardiologist-  dr Nat Math gerhart NP--- per cath 06-02-2010 non-obstructive cad pLAD 30-40%  . Diverticulosis of colon   . DOE (dyspnea on exertion)   . Gout   . History of adenomatous polyp of colon    2002-- tubular adenoma  . History of aortic insufficiency    severe -- s/p  AVR 08-03-2010  . History of basal cell carcinoma (BCC) excision    10/ 2015  left ear  . History of small bowel obstruction    02/ 2007 mechanical sbo s/p  surgical intervention;  partial sbo 09/ 2011 and 03-20-2011 resolved without surgical intervention  . History of squamous cell carcinoma excision    2014 right foot  . History of urinary retention   . HTN (hypertension)   . Peripheral neuropathy   . S/P aortic valve replacement with prosthetic valve 08/03/2010   tissue valve  . S/P patent foramen ovale closure 08/03/2010   at same time AVR    Past Surgical History:  Procedure Laterality Date  . CARDIAC CATHETERIZATION  06-02-2010  dr Marlou Porch   non-obstructive cad- pLAD 30-40%/  normal LVSF/  severe AI  .  CARDIOVASCULAR STRESS TEST  04/12/2016   Low risk nuclear perfusion study w/ no significant reversible ischemia/  normal LV function and wall motion ,  stress ef 60%/  37mm inferior and lateral scooped ST-segment depression w/ exercise (may be repolarization abnormality), exercise capacity was moderately reduced  . CATARACT EXTRACTION W/ INTRAOCULAR LENS  IMPLANT, BILATERAL  02/2010  . CYSTOSCOPY WITH LITHOLAPAXY N/A 06/05/2016   Procedure: CYSTOSCOPY WITH LITHOLAPAXY and fulgarization of bladder neck;  Surgeon: Irine Seal, MD;  Location: [facility];  Service: Urology;  Laterality: N/A;  . EXPLORATORY LAPARTOMY /  CHOLECYSTECTOMY  02/28/2005   for Small  bowel obstruction (mechnical)  . IR ANGIO EXTRACRAN SEL COM CAROTID INNOMINATE UNI L MOD SED  10/24/2016  . IR ANGIO VERTEBRAL SEL SUBCLAVIAN INNOMINATE BILAT MOD SED  10/24/2016  . IR PERCUTANEOUS ART THROMBECTOMY/INFUSION INTRACRANIAL INC DIAG ANGIO  10/24/2016  . LEFT KNEE ARTHROSCOPY  2006  . RADIOLOGY WITH ANESTHESIA N/A 10/24/2016   Procedure: RADIOLOGY WITH ANESTHESIA;  Surgeon: Luanne Bras, MD;  Location: [facility];  Service: Radiology;  Laterality: N/A;  . RIGHT FOOT SURGERY    . RIGHT MINIATURE ANTERIOR THORACOTOMY FOR AORTIC VALVE REPLACEMENT AND CLOSURE PATENT FORAMEN OVALE  08-03-2010  DR Levada Schilling Magna-ease pericardial tissue  valve (12mm)  . TRANSTHORACIC ECHOCARDIOGRAM  05/30/2016  dr skains   moderate  LVH ef 60-65%/  bioprothesis aortic valve present ,normal grandient and no AI /  mild MV calcification , moderate MR /  mild PR/ moderate TR/  PASP 47mmHg/ (RA denisty was identified 04-27-2016 echo) and is seen again today, this is likely a promient eustacian ridge, atrium is normal size    There were no vitals filed for this visit.  Subjective Assessment - 12/04/16 0938    Subjective  No pain or soreness after session yesterday; did not perform the exercises last night - was not aware he needed to do the  exercises 2x/day.  Would like to work on balance today.    Patient is accompained by:  Family member    Pertinent History  A-Fib (new diagnosis), HTN, s/p AVR, CAD, Gout, left knee arthroscopy, borderline DM    Limitations  Standing;Walking    Patient Stated Goals  To improve stamina & strength to return to prior level    Currently in Pain?  No/denies       Reviewed hip stretch and added dynamic standing balance exercises to HEP:  Piriformis Stretch, Supine    Lie supine, right ankle crossed onto left knee. Press right knee down and slide left foot as close to your buttocks as you can tolerate until stretch is felt in buttock of top leg. Hold _30__ seconds.  Repeat _2__ times per session. Do __2_ sessions per day.   Standing Marching   Placing one hand on hallway wall, march down the hallway pausing with leg lifted in the air Repeat 2 laps. Do 2 sessions per day.  http://[email or web address].exer.us/344    Feet Heel-Toe "Tandem", Varied Arm Positions - Eyes Open   With eyes open, right foot in front of the other, touching chair in front for support, look straight ahead at a stationary object. Hold 30 seconds. Repeat with left foot forwards, 30 seconds.  Close eyes and repeat again 30 seconds with right foot forward and then left foot forward. Do 2 sessions per day.   Feet Heel-Toe "Tandem"    Arms outstretched to touch wall, walk a straight line bringing one foot directly in front of the other. Repeat for 2 laps down hallway per session. Do _2___ sessions per day.  Copyright  VHI. All rights reserved.     Braiding   Move to side: 1) cross right leg in front of left, 2) bring back leg out to side, then 3) cross right leg behind left, 4) bring left leg out to side. Continue sequence in same direction. Reverse sequence, moving in opposite direction. Repeat sequence 2 lapss per session. Do 2 sessions per day.                    Balance Exercises - 12/04/16 0952       Balance Exercises: Standing   Standing Eyes Opened  Narrow base of support (BOS);Solid surface;30 secs    Standing Eyes Closed  Narrow base of support (BOS);Solid surface;30 secs    Tandem Gait  Forward;Intermittent upper extremity support;Foam/compliant surface;4 reps solid and compliant surface    Sidestepping  Foam/compliant support;Upper extremity support;4 reps    Marching Limitations  high knee marching x 4 reps with focus on pausing at top for SLS    Other Standing Exercises  Step over braiding to L and R on compliant blue foam balance beam with UE support x 4 reps  PT Education - 12/04/16 1650    Education provided  Yes    Education Details  updated and condensed HEP and clarified dosage and frequency    Person(s) Educated  Patient;Spouse    Methods  Explanation;Demonstration;Handout    Comprehension  Verbalized understanding;Returned demonstration          PT Long Term Goals - 11/14/16 1852      PT LONG TERM GOAL #1   Title  Patient demonstrates & verbalizes understanding of ongoing HEP / fitness plan. (All LTGs Target Date: 12/21/2016)    Time  4    Period  Weeks    Status  New    Target Date  12/21/16      PT LONG TERM GOAL #2   Title  6-Minute Walk Test >1320'     Time  4    Period  Weeks    Status  New    Target Date  12/21/16      PT LONG TERM GOAL #3   Title  Functional Gait Assessment >19/30    Time  4    Period  Weeks    Status  New    Target Date  12/21/16      PT LONG TERM GOAL #4   Title  Patient ambulates 1000' outdoors including grass, ramps, curbs without device independently.     Time  4    Period  Weeks    Status  New    Target Date  12/21/16      PT LONG TERM GOAL #5   Title  Patient reports right hip pain </= 2/10    Time  4    Period  Weeks    Status  New    Target Date  12/21/16            Plan - 12/04/16 1652    Clinical Impression Statement  Focused treatment session today on prescription of balance exercises to  HEP.  Due to pt having multiple exercises from OT and PT, condensed PT HEP down to stretches and balance exercises for pt to perform every day and sidelying, weighted hip exercises to perform every other day.  Pt verbalized understanding of new parameters.  New handouts provided.      Rehab Potential  Good    Clinical Impairments Affecting Rehab Potential  A-Fib (new diagnosis), HTN, s/p AVR, CAD, Gout, left knee arthroscopy, borderline DM    PT Frequency  2x / week    PT Duration  4 weeks    PT Treatment/Interventions  ADLs/Self Care Home Management;DME Instruction;Gait training;Stair training;Functional mobility training;Therapeutic activities;Therapeutic exercise;Balance training;Neuromuscular re-education;Patient/family education;Moist Heat;Ultrasound;Manual techniques;Vestibular    PT Next Visit Plan   L NMR/balance, focus on LLE clearance during gait, attention to L during gait/obstacle negotiation, dual task    Consulted and Agree with Plan of Care  Patient;Family member/caregiver    Family Member Consulted  wife       Patient will benefit from skilled therapeutic intervention in order to improve the following deficits and impairments:  Abnormal gait, Decreased activity tolerance, Decreased balance, Decreased endurance, Decreased strength, Pain, Postural dysfunction, Decreased mobility  Visit Diagnosis: Hemiplegia and hemiparesis following cerebral infarction affecting left non-dominant side (HCC)  Muscle weakness (generalized)  Unsteadiness on feet     Problem List Patient Active Problem List   Diagnosis Date Noted  . Trochanteric bursitis of right hip 11/27/2016  . History of ischemic left MCA stroke 11/27/2016  . PAF (paroxysmal atrial fibrillation) ([facility])   .  Hyperglycemia   . Right middle cerebral artery stroke ([place]) 10/29/2016  . Acute embolic stroke ([place])   . Hypokalemia   . Left hemiparesis ([place])   . Dysphagia, post-stroke   . S/P AVR (aortic valve replacement)   .  Benign essential HTN   . Diabetes mellitus type 2 in nonobese (HCC)   . Benign prostatic hyperplasia   . A-fib ([place]) 10/28/2016  . CVA (cerebral vascular accident) ([place]) 10/24/2016  . HTN (hypertension)   . Gout   . History of small bowel obstruction   . History of BPH   . S/P AVR 08/03/2010   Rico Junker, PT, DPT 12/04/16    4:58 PM    [facility] [address] [place], [place], [address] Phone: [phone]   Fax:  [phone]  Name: [name] MRN: [id number] Date of Birth: 26-Mar-1935

## 2016-12-04 NOTE — Patient Instructions (Signed)
PERFORM EVERY DAY: 1-2 TIMES A DAY  Piriformis Stretch, Supine    Lie supine, right ankle crossed onto left knee. Press right knee down and slide left foot as close to your buttocks as you can tolerate until stretch is felt in buttock of top leg. Hold _30__ seconds.  Repeat _2__ times per session. Do __2_ sessions per day.  IT Band: Wall Lean With Crossed Leg    Stand with right hand on wall. Cross right leg behind left leg. Stretch right hip toward wall with other arm supporting trunk. Hold __30_ seconds. Relax. Repeat _2__ times. Do _2__ times a day.  SINGLE LIMB STANCE    Hold on for support; Stance: single leg on floor. Raise leg. Hold _12__ seconds keeping hips level. Repeat with other leg. _2__ reps per set, _2__ sets per day    Standing Marching   Placing one hand on hallway wall, march down the hallway pausing with leg lifted in the air Repeat 2 laps. Do 2 sessions per day.  http://[email or web address].exer.us/344    Feet Heel-Toe "Tandem", Varied Arm Positions - Eyes Open   With eyes open, right foot in front of the other, touching chair in front for support, look straight ahead at a stationary object. Hold 30 seconds. Repeat with left foot forwards, 30 seconds.  Close eyes and repeat again 30 seconds with right foot forward and then left foot forward. Do 2 sessions per day.   Hip Abduction: Side-Stepping: Monster Steps (Eccentric)    With band around thighs or ankles, step out to right side, walking along counter top. Keep toes pointed forward.  Repeat to Left side walking down countertop  _4__ laps per set, _2__ sets per day   Feet Heel-Toe "Tandem"    Arms outstretched to touch wall, walk a straight line bringing one foot directly in front of the other. Repeat for 2 laps down hallway per session. Do _2___ sessions per day.  Copyright  VHI. All rights reserved.     Braiding   Move to side: 1) cross right leg in front of left, 2) bring back leg out to side, then  3) cross right leg behind left, 4) bring left leg out to side. Continue sequence in same direction. Reverse sequence, moving in opposite direction. Repeat sequence 2 lapss per session. Do 2 sessions per day.   PERFORM EVERY OTHER DAY: 1-2 TIMES A DAY  HIP: Abduction - Side-Lying (Weight)    Place weight on top leg. Bend bottom leg.  Squeeze glutes. Raise leg up and slightly back. Hold _1-2__ seconds. Use __3_ lb weight. _12_ reps per side, _2__ sets per day Bend bottom leg to stabilize pelvis.  FLEXION: Side-Lying (Active)    Lie on side, bottom leg bent, top leg straight. Lift top leg slightly: Draw top leg toward chest and then kick the heel back straightening the leg. Use _3__ lbs. Complete __1_ sets of _12__ repetitions each leg.  2 sessions per day.

## 2016-12-04 NOTE — Therapy (Signed)
[facility] [address] [place], [place], [address] Phone: [phone]   Fax:  [phone]  Occupational Therapy Treatment  Patient Details  Name: [name] MRN: [id number] Date of Birth: [birth year] Referring Provider: Dr. Alysia Penna   Encounter Date: 12/04/2016  OT End of Session - 12/04/16 1025    Visit Number  4    Number of Visits  9    Date for OT Re-Evaluation  12/14/16    Authorization Type  [employer] Medicare, no visit limit/no auth; G-code needed    Authorization Time Period  cert. date 11/14/16-01/13/2017    Authorization - Visit Number  4    Authorization - Number of Visits  10    OT Start Time  6701    OT Stop Time  1101    OT Time Calculation (min)  38 min    Activity Tolerance  Patient tolerated treatment well    Behavior During Therapy  WFL for tasks assessed/performed       Past Medical History:  Diagnosis Date  . Bladder stones   . Borderline diabetes   . BPH (benign prostatic hyperplasia)   . Coronary artery disease    cardiologist-  dr Nat Math gerhart NP--- per cath 06-02-2010 non-obstructive cad pLAD 30-40%  . Diverticulosis of colon   . DOE (dyspnea on exertion)   . Gout   . History of adenomatous polyp of colon    2002-- tubular adenoma  . History of aortic insufficiency    severe -- s/p  AVR 08-03-2010  . History of basal cell carcinoma (BCC) excision    10/ 2015  left ear  . History of small bowel obstruction    02/ 2007 mechanical sbo s/p  surgical intervention;  partial sbo 09/ 2011 and 03-20-2011 resolved without surgical intervention  . History of squamous cell carcinoma excision    2014 right foot  . History of urinary retention   . HTN (hypertension)   . Peripheral neuropathy   . S/P aortic valve replacement with prosthetic valve 08/03/2010   tissue valve  . S/P patent foramen ovale closure 08/03/2010   at same time AVR    Past Surgical History:   Procedure Laterality Date  . CARDIAC CATHETERIZATION  06-02-2010  dr Marlou Porch   non-obstructive cad- pLAD 30-40%/  normal LVSF/  severe AI  . CARDIOVASCULAR STRESS TEST  04/12/2016   Low risk nuclear perfusion study w/ no significant reversible ischemia/  normal LV function and wall motion ,  stress ef 60%/  77mm inferior and lateral scooped ST-segment depression w/ exercise (may be repolarization abnormality), exercise capacity was moderately reduced  . CATARACT EXTRACTION W/ INTRAOCULAR LENS  IMPLANT, BILATERAL  02/2010  . CYSTOSCOPY WITH LITHOLAPAXY N/A 06/05/2016   Procedure: CYSTOSCOPY WITH LITHOLAPAXY and fulgarization of bladder neck;  Surgeon: Irine Seal, MD;  Location: [facility];  Service: Urology;  Laterality: N/A;  . EXPLORATORY LAPARTOMY /  CHOLECYSTECTOMY  02/28/2005   for Small  bowel obstruction (mechnical)  . IR ANGIO EXTRACRAN SEL COM CAROTID INNOMINATE UNI L MOD SED  10/24/2016  . IR ANGIO VERTEBRAL SEL SUBCLAVIAN INNOMINATE BILAT MOD SED  10/24/2016  . IR PERCUTANEOUS ART THROMBECTOMY/INFUSION INTRACRANIAL INC DIAG ANGIO  10/24/2016  . LEFT KNEE ARTHROSCOPY  2006  . RADIOLOGY WITH ANESTHESIA N/A 10/24/2016   Procedure: RADIOLOGY WITH ANESTHESIA;  Surgeon: Luanne Bras, MD;  Location: [facility];  Service: Radiology;  Laterality: N/A;  . RIGHT FOOT SURGERY    .  RIGHT MINIATURE ANTERIOR THORACOTOMY FOR AORTIC VALVE REPLACEMENT AND CLOSURE PATENT FORAMEN OVALE  08-03-2010  DR Levada Schilling Magna-ease pericardial tissue valve (14mm)  . TRANSTHORACIC ECHOCARDIOGRAM  05/30/2016  dr skains   moderate  LVH ef 60-65%/  bioprothesis aortic valve present ,normal grandient and no AI /  mild MV calcification , moderate MR /  mild PR/ moderate TR/  PASP 92mmHg/ (RA denisty was identified 04-27-2016 echo) and is seen again today, this is likely a promient eustacian ridge, atrium is normal size    There were no vitals filed for this visit.  Subjective Assessment -  12/04/16 1023    Patient is accompained by:  Family member wife    Pertinent History  a-fib, HTN, boarderline DM, CAD, aortic valve replacement, peripheral neuropathy, gout, diverticulosis, s/p patent foramen ovale closure     Limitations  fall risk, no driving    Patient Stated Goals  improve strength and endurance    Currently in Pain?  No/denies    Pain Onset  More than a month ago       In quadruped, alternating UE lifts for incr core/scapular strength.  Holding 10sec each x5.     In standing, shoulder flex with squat closed-chain BUEs with 4lb weighted ball (floor to shoulder height) x10.  In standing, functional reaching with LUE incorporating wt. Shift (lateral), trunk rotation, reaching lateral and across body and overhead to place pegs in vertical pegboard.  Pt put in 3 rows with short rest break after each.   In supine, shoulder flex and chest press with BUEs with 4lb weighted ball x2 sets of 10 each.  Arm bike x50min level 3 for conditioning with goal to maintain >40rpms (forward/backwards).  Pt able to maintain 42-48rpms.    Pt needed multiple short rest breaks throughout session.                    OT Education - 12/04/16 1028    Education Details  Reviewed red theraband HEP.  Pt performed 2 sets of 10 each.    Person(s) Educated  Patient    Methods  Explanation;Demonstration    Comprehension  Verbalized understanding;Returned demonstration          OT Long Term Goals - 11/21/16 0947      OT LONG TERM GOAL #1   Title  Pt will be independent with HEP for LUE strength.--check LTGs 12/14/16    Status  On-going      OT LONG TERM GOAL #2   Title  Pt will perfom simple-mod complex environmental scanning with at least 90% accuracy in prep for return to driving.    Status  On-going      OT LONG TERM GOAL #3   Title  Pt will demo safety and endurance to be able to return to previous yardwork.      Status  On-going            Plan - 12/04/16  1026    Clinical Impression Statement  Pt is progressing towards goals with improving strength and HEP performance.    Rehab Potential  Good    OT Frequency  2x / week    OT Duration  4 weeks    OT Treatment/Interventions  Self-care/ADL training;Patient/family education;Balance training;Therapeutic exercises;Therapeutic activities;Cognitive remediation/compensation;Visual/perceptual remediation/compensation;Energy conservation;Functional Mobility Training;Neuromuscular education    Plan  continue with activity tolerance, core strength, environmental scanning, ?boxing    OT Home Exercise Plan  Initiated strengthening  Consulted and Agree with Plan of Care  Patient;Family member/caregiver    Family Member Consulted  wife       Patient will benefit from skilled therapeutic intervention in order to improve the following deficits and impairments:  Decreased cognition, Impaired vision/preception, Decreased activity tolerance, Decreased endurance, Decreased strength, Decreased balance  Visit Diagnosis: Hemiplegia and hemiparesis following cerebral infarction affecting left non-dominant side (HCC)  Muscle weakness (generalized)  Attention and concentration deficit  Visuospatial deficit    Problem List Patient Active Problem List   Diagnosis Date Noted  . Trochanteric bursitis of right hip 11/27/2016  . History of ischemic left MCA stroke 11/27/2016  . PAF (paroxysmal atrial fibrillation) ([place])   . Hyperglycemia   . Right middle cerebral artery stroke ([place]) 10/29/2016  . Acute embolic stroke ([place])   . Hypokalemia   . Left hemiparesis ([place])   . Dysphagia, post-stroke   . S/P AVR (aortic valve replacement)   . Benign essential HTN   . Diabetes mellitus type 2 in nonobese (HCC)   . Benign prostatic hyperplasia   . A-fib ([place]) 10/28/2016  . CVA (cerebral vascular accident) ([place]) 10/24/2016  . HTN (hypertension)   . Gout   . History of small bowel obstruction   . History of BPH    . S/P AVR 08/03/2010    [facility] 12/04/2016, 12:25 PM  [facility] [address] [place], [place], [address] Phone: [phone]   Fax:  [phone]  Name: [name] MRN: [id number] Date of Birth: [birth year]   Vianne Bulls, OTR/L [facility] [address] [place], [place]  [address] [phone] phone [phone] 12/04/16 12:25 PM

## 2016-12-05 ENCOUNTER — Ambulatory Visit (HOSPITAL_COMMUNITY)
Admission: RE | Admit: 2016-12-05 | Discharge: 2016-12-05 | Disposition: A | Discharge status: Home / Self Care | Payer: Medicare Other | Source: Ambulatory Visit | Attending: Interventional Radiology | Admitting: Interventional Radiology

## 2016-12-05 ENCOUNTER — Encounter: Payer: Self-pay | Admitting: Physical Therapy

## 2016-12-05 DIAGNOSIS — Z7901 Long term (current) use of anticoagulants: Secondary | ICD-10-CM | POA: Insufficient documentation

## 2016-12-05 DIAGNOSIS — Z48812 Encounter for surgical aftercare following surgery on the circulatory system: Secondary | ICD-10-CM | POA: Diagnosis not present

## 2016-12-05 DIAGNOSIS — Z882 Allergy status to sulfonamides status: Secondary | ICD-10-CM | POA: Insufficient documentation

## 2016-12-05 DIAGNOSIS — I639 Cerebral infarction, unspecified: Secondary | ICD-10-CM

## 2016-12-05 HISTORY — PX: IR RADIOLOGIST EVAL & MGMT: IMG5224

## 2016-12-05 NOTE — Therapy (Signed)
[name] [address] [place], [place], [address] Phone: [phone]   Fax:  [phone]  Patient Details  Name: [name] MRN: [id number] Date of Birth: 1935-11-19 Referring Provider:  No ref. provider found  Encounter Date: 24-Dec-2016  PHYSICAL THERAPY DISCHARGE SUMMARY  Visits from Start of Care: 4  Current functional level related to goals / functional outcomes: Unable to assess LTG; pt cancelled remaining visits and requested to be D/C - pt felt therapy was no longer needed    Remaining deficits: Hemiparesis with impaired strength, impaired cognition and attention, impaired balance and gait   Education / Equipment: HEP  Plan: Patient agrees to discharge.  Patient goals were not met. Patient is being discharged due to the patient's request.  ?????     G-Codes - 2016/12/24 1536    Functional Assessment Tool Used (Outpatient Only)  Functional Gait Assessment 9/30 - not assessed at D/C - pt cancelled all remaining visits    Functional Limitation  Mobility: Walking and moving around    Mobility: Walking and Moving Around Goal Status [phone])  At least 20 percent but less than 40 percent impaired, limited or restricted    Mobility: Walking and Moving Around Discharge Status [phone])  At least 60 percent but less than 80 percent impaired, limited or restricted      Rico Junker, PT, DPT 12-24-16    3:41 PM   [facility] [address] [place], [place], [address] Phone: [phone]   Fax:  [phone]

## 2016-12-06 ENCOUNTER — Encounter (HOSPITAL_COMMUNITY): Payer: Self-pay | Admitting: Interventional Radiology

## 2016-12-06 ENCOUNTER — Ambulatory Visit: Payer: Medicare Other | Admitting: Physical Therapy

## 2016-12-06 ENCOUNTER — Encounter: Payer: Medicare Other | Admitting: Occupational Therapy

## 2016-12-10 ENCOUNTER — Encounter: Payer: Self-pay | Admitting: Occupational Therapy

## 2016-12-10 NOTE — Therapy (Signed)
[facility] [address] [place], [place], [address] Phone: [phone]   Fax:  [phone]  Patient Details  Name: [name] MRN: [id number] Date of Birth: 1935-07-02 Referring Provider:  No ref. provider found  Encounter Date: 12/10/2016   OCCUPATIONAL THERAPY DISCHARGE SUMMARY  Visits from Start of Care: 4  Current functional level related to goals / functional outcomes:   OT Long Term Goals - 11/21/16 0947      OT LONG TERM GOAL #1   Title  Pt will be independent with HEP for LUE strength.--check LTGs 12/14/16    Status  Met.      OT LONG TERM GOAL #2   Title  Pt will perfom simple-mod complex environmental scanning with at least 90% accuracy in prep for return to driving.    Status  Partially met.  93% in min distracting environment     OT LONG TERM GOAL #3   Title  Pt will demo safety and endurance to be able to return to previous yardwork.      Status  Not met/unable to reassess due to pt not returning to OT  (only used lawnmower to pick up leaves)        Remaining deficits: Mild decr strength/activity tolerance, ?mild decr visual scanning  Unable to fully re-assess all goals due to pt not returning to OT.   Education / Equipment: Pt instructed in HEP for LUE/core strength and pt verbalized understanding of education provided.  Plan: Patient agrees to discharge.  Patient goals were partially met. Patient is being discharged due to being pleased with the current functional level.  Pt requests d/c at this time and cancelled remaining visits. ?????       [facility] 12/10/2016, 8:36 AM  [facility] [address] [place], [place], [address] Phone: [phone]   Fax:  [place], OTR/L [facility] [address] [place], [place]  [address] [phone]  phone [phone] 12/10/16 8:38 AM

## 2016-12-12 ENCOUNTER — Ambulatory Visit: Payer: Medicare Other | Admitting: Physical Therapy

## 2016-12-12 ENCOUNTER — Encounter: Payer: Medicare Other | Admitting: Occupational Therapy

## 2016-12-14 ENCOUNTER — Ambulatory Visit: Payer: Medicare Other | Admitting: Physical Therapy

## 2016-12-14 ENCOUNTER — Encounter: Payer: Medicare Other | Admitting: Occupational Therapy

## 2016-12-18 ENCOUNTER — Encounter: Payer: Medicare Other | Admitting: Occupational Therapy

## 2016-12-18 ENCOUNTER — Ambulatory Visit: Payer: Medicare Other | Admitting: Physical Therapy

## 2016-12-20 ENCOUNTER — Encounter: Payer: Medicare Other | Admitting: Occupational Therapy

## 2016-12-20 ENCOUNTER — Ambulatory Visit: Payer: Medicare Other | Admitting: Physical Therapy

## 2016-12-21 ENCOUNTER — Encounter: Payer: Medicare Other | Admitting: Occupational Therapy

## 2016-12-21 ENCOUNTER — Ambulatory Visit: Payer: Medicare Other | Admitting: Physical Therapy

## 2016-12-26 ENCOUNTER — Encounter: Payer: Self-pay | Admitting: Nurse Practitioner

## 2016-12-26 ENCOUNTER — Ambulatory Visit: Payer: Medicare Other | Admitting: Nurse Practitioner

## 2016-12-26 VITALS — BP 126/72 | HR 62 | Ht 72.0 in | Wt 173.1 lb

## 2016-12-26 DIAGNOSIS — I481 Persistent atrial fibrillation: Secondary | ICD-10-CM | POA: Diagnosis not present

## 2016-12-26 DIAGNOSIS — Z952 Presence of prosthetic heart valve: Secondary | ICD-10-CM | POA: Diagnosis not present

## 2016-12-26 DIAGNOSIS — Z79899 Other long term (current) drug therapy: Secondary | ICD-10-CM | POA: Diagnosis not present

## 2016-12-26 DIAGNOSIS — I4819 Other persistent atrial fibrillation: Secondary | ICD-10-CM

## 2016-12-26 LAB — BASIC METABOLIC PANEL
BUN/Creatinine Ratio: 13 (ref 10–24)
BUN: 13 mg/dL (ref 8–27)
CO2: 22 mmol/L (ref 20–29)
Calcium: 9.3 mg/dL (ref 8.6–10.2)
Chloride: 105 mmol/L (ref 96–106)
Creatinine, Ser: 0.98 mg/dL (ref 0.76–1.27)
GFR calc Af Amer: 84 mL/min/{1.73_m2} (ref >59)
GFR calc non Af Amer: 73 mL/min/{1.73_m2} (ref >59)
Glucose: 129 mg/dL — ABNORMAL HIGH (ref 65–99)
Potassium: 4.3 mmol/L (ref 3.5–5.2)
Sodium: 142 mmol/L (ref 134–144)

## 2016-12-26 LAB — CBC
Hematocrit: 40.4 % (ref 37.5–51.0)
Hemoglobin: 13.6 g/dL (ref 13.0–17.7)
MCH: 32.3 pg (ref 26.6–33.0)
MCHC: 33.7 g/dL (ref 31.5–35.7)
MCV: 96 fL (ref 79–97)
Platelets: 149 10*3/uL — ABNORMAL LOW (ref 150–379)
RBC: 4.21 x10E6/uL (ref 4.14–5.80)
RDW: 15 % (ref 12.3–15.4)
WBC: 5.9 10*3/uL (ref 3.4–10.8)

## 2016-12-26 MED ORDER — APIXABAN 5 MG PO TABS: 5.0000 mg | ORAL_TABLET | Freq: Two times a day (BID) | ORAL | 3 refills | Status: DC

## 2016-12-26 NOTE — Patient Instructions (Addendum)
We will be checking the following labs today - BMET and CBC   Medication Instructions:    Continue with your current medicines.     Testing/Procedures To Be Arranged:  N/A  Follow-Up:   See me in 4 months with an EKG    Other Special Instructions:   N/A    If you need a refill on your cardiac medications before your next appointment, please call your pharmacy.   Call the [place] office at [phone] if you have any questions, problems or concerns.
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
## 2017-01-16 ENCOUNTER — Encounter: Payer: Self-pay | Admitting: Neurology

## 2017-01-16 ENCOUNTER — Ambulatory Visit: Payer: Medicare Other | Admitting: Neurology

## 2017-01-16 VITALS — BP 129/83 | HR 63 | Wt 172.6 lb

## 2017-01-16 DIAGNOSIS — I63511 Cerebral infarction due to unspecified occlusion or stenosis of right middle cerebral artery: Secondary | ICD-10-CM

## 2017-01-16 DIAGNOSIS — I480 Paroxysmal atrial fibrillation: Secondary | ICD-10-CM | POA: Diagnosis not present

## 2017-01-16 DIAGNOSIS — I10 Essential (primary) hypertension: Secondary | ICD-10-CM

## 2017-01-16 DIAGNOSIS — I639 Cerebral infarction, unspecified: Secondary | ICD-10-CM | POA: Diagnosis not present

## 2017-01-16 NOTE — Patient Instructions (Addendum)
-   continue eliquis for stroke prevention - follow up with cardiology as scheduled - Follow up with your primary care physician for stroke risk factor modification. Recommend maintain blood pressure goal <130/80, diabetes with hemoglobin A1c goal below 7.0% and lipids with LDL cholesterol goal below 70 mg/dL.  - health diet and regular self exercise - check BP at home - No restriction for driving, but recommend to drive with family members on board for 2-3 times initially. If both parties feel comfortable of your driving, you can drive alone after. However, you are recommended to drive during the day not at night, no long distance and drive in familiar roads. - follow up in 3 months.
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
## 2017-02-05 NOTE — Patient Outreach (Signed)
mRs = 1 obtained by Doctor on 01/16/17.

## 2017-04-16 ENCOUNTER — Telehealth: Payer: Self-pay

## 2017-04-16 NOTE — Telephone Encounter (Signed)
I called pt, spoke to pt's wife [name], and advised her that pt will be seeing Janett Billow, NP tomorrow instead of Hoyle Sauer, NP. Pt's wife is agreeable to this, and I reminded pt's wife of pt's appt date and time.

## 2017-04-17 ENCOUNTER — Telehealth: Payer: Self-pay | Admitting: Adult Health

## 2017-04-17 ENCOUNTER — Encounter: Payer: Self-pay | Admitting: Adult Health

## 2017-04-17 ENCOUNTER — Ambulatory Visit: Payer: Medicare Other | Admitting: Adult Health

## 2017-04-17 VITALS — BP 124/80 | HR 72 | Ht 72.0 in | Wt 165.0 lb

## 2017-04-17 DIAGNOSIS — I10 Essential (primary) hypertension: Secondary | ICD-10-CM | POA: Diagnosis not present

## 2017-04-17 DIAGNOSIS — I63511 Cerebral infarction due to unspecified occlusion or stenosis of right middle cerebral artery: Secondary | ICD-10-CM | POA: Diagnosis not present

## 2017-04-17 NOTE — Telephone Encounter (Signed)
4/10- pt was seen today and needed a 1 year follow up for April of 2020. Pt did not want to schedule the one year at this time. He stated he will call when he is ready to schedule.
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
## 2017-04-17 NOTE — Patient Instructions (Addendum)
Continue Eliquis (apixaban) daily  for secondary stroke prevention  Continue to follow up with your primary care for blood pressure management; also speak to your PCP regarding weight loss  Continue to follow up with your heart doctor for atrial fibrillation and Eliquis management   Maintain strict control of hypertension with blood pressure goal below 130/90, diabetes with hemoglobin A1c goal below 6.5% and cholesterol with LDL cholesterol (bad cholesterol) goal below 70 mg/dL. I also advised the patient to eat a healthy diet with plenty of whole grains, cereals, fruits and vegetables, exercise regularly and maintain ideal body weight.  Followup in the future with me in 1 year

## 2017-04-23 NOTE — Progress Notes (Signed)
Personally  participated in, made any corrections needed, and agree with history, physical, neuro exam,assessment and plan as stated above.    Gailen Venne, MD [facility] 

## 2017-05-01 ENCOUNTER — Telehealth: Payer: Self-pay | Admitting: Nurse Practitioner

## 2017-05-01 ENCOUNTER — Encounter: Payer: Self-pay | Admitting: Nurse Practitioner

## 2017-05-01 ENCOUNTER — Telehealth: Payer: Self-pay | Admitting: *Deleted

## 2017-05-01 ENCOUNTER — Ambulatory Visit: Payer: Medicare Other | Admitting: Nurse Practitioner

## 2017-05-01 VITALS — BP 136/68 | HR 67 | Ht 72.0 in | Wt 164.1 lb

## 2017-05-01 DIAGNOSIS — I481 Persistent atrial fibrillation: Secondary | ICD-10-CM | POA: Diagnosis not present

## 2017-05-01 DIAGNOSIS — Z952 Presence of prosthetic heart valve: Secondary | ICD-10-CM | POA: Diagnosis not present

## 2017-05-01 DIAGNOSIS — Z79899 Other long term (current) drug therapy: Secondary | ICD-10-CM | POA: Diagnosis not present

## 2017-05-01 DIAGNOSIS — I4819 Other persistent atrial fibrillation: Secondary | ICD-10-CM

## 2017-05-01 DIAGNOSIS — R634 Abnormal weight loss: Secondary | ICD-10-CM

## 2017-05-01 NOTE — Telephone Encounter (Signed)
S/w Leafy Ro at [facility] [place] office @ [phone] to request two sets of labs on pt.  Stated will fax to office.

## 2017-05-01 NOTE — Telephone Encounter (Signed)
Pt's wife calling in, will call back tomorrow when pt is home to see when pt can come in for labs.

## 2017-05-01 NOTE — Telephone Encounter (Signed)
Received labs from [facility].  Cecille Rubin wants a Sed rate/TSH/T3 free/T4.  Labs placed in system.

## 2017-05-01 NOTE — Patient Instructions (Addendum)
We will be checking the following labs today - NONE  We will call Dr. Drema Dallas and get all your recent labs - should be 2 sets of labs    Medication Instructions:    Continue with your current medicines. BUT  STOP Tramadol    Testing/Procedures To Be Arranged:  N/A  Follow-Up:   See me in 4 months    Other Special Instructions:   Let me see what your labs show - we will then decide about proceeding CT scans    If you need a refill on your cardiac medications before your next appointment, please call your pharmacy.   Call the [place] office at [phone] if you have any questions, problems or concerns.
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
## 2017-05-01 NOTE — Telephone Encounter (Signed)
NEW MESSAGE   Patient spouse calling, declined to arrange lab appointment at this time, requesting to speak with nurse first.

## 2017-05-02 ENCOUNTER — Other Ambulatory Visit: Payer: Medicare Other | Admitting: *Deleted

## 2017-05-02 DIAGNOSIS — R634 Abnormal weight loss: Secondary | ICD-10-CM

## 2017-05-02 LAB — SEDIMENTATION RATE: Sed Rate: 13 mm/hr (ref 0–30)

## 2017-05-02 LAB — TSH: TSH: 1.92 u[IU]/mL (ref 0.450–4.500)

## 2017-05-02 LAB — T4: T4, Total: 6.1 ug/dL (ref 4.5–12.0)

## 2017-05-02 LAB — T3, FREE: T3, Free: 2.9 pg/mL (ref 2.0–4.4)

## 2017-05-02 NOTE — Telephone Encounter (Signed)
Pt called in today to set up lab appt.  Appt made for today, orders linked.

## 2017-05-06 ENCOUNTER — Other Ambulatory Visit: Payer: Self-pay | Admitting: Nurse Practitioner

## 2017-05-06 DIAGNOSIS — R634 Abnormal weight loss: Secondary | ICD-10-CM

## 2017-05-08 ENCOUNTER — Ambulatory Visit (INDEPENDENT_AMBULATORY_CARE_PROVIDER_SITE_OTHER)
Admission: RE | Admit: 2017-05-08 | Discharge: 2017-05-08 | Disposition: A | Discharge status: Home / Self Care | Payer: Medicare Other | Source: Ambulatory Visit | Attending: Nurse Practitioner | Admitting: Nurse Practitioner

## 2017-05-08 DIAGNOSIS — R634 Abnormal weight loss: Secondary | ICD-10-CM | POA: Diagnosis not present

## 2017-05-08 MED ORDER — IOPAMIDOL (ISOVUE-300) INJECTION 61%
100.0000 mL | Freq: Once | INTRAVENOUS | Status: AC | PRN
  Administered 2017-05-08: 100 mL via INTRAVENOUS

## 2017-05-13 ENCOUNTER — Other Ambulatory Visit: Payer: Medicare Other

## 2017-08-14 ENCOUNTER — Other Ambulatory Visit: Payer: Self-pay | Admitting: Family Medicine

## 2017-08-14 DIAGNOSIS — R6889 Other general symptoms and signs: Secondary | ICD-10-CM

## 2017-08-21 ENCOUNTER — Encounter: Payer: Self-pay | Admitting: Nurse Practitioner

## 2017-08-21 ENCOUNTER — Ambulatory Visit: Payer: Medicare Other | Admitting: Nurse Practitioner

## 2017-08-21 VITALS — BP 136/68 | HR 70 | Ht 71.5 in | Wt 167.8 lb

## 2017-08-21 DIAGNOSIS — Z79899 Other long term (current) drug therapy: Secondary | ICD-10-CM | POA: Diagnosis not present

## 2017-08-21 DIAGNOSIS — I071 Rheumatic tricuspid insufficiency: Secondary | ICD-10-CM

## 2017-08-21 DIAGNOSIS — I340 Nonrheumatic mitral (valve) insufficiency: Secondary | ICD-10-CM

## 2017-08-21 DIAGNOSIS — I4819 Other persistent atrial fibrillation: Secondary | ICD-10-CM

## 2017-08-21 DIAGNOSIS — I481 Persistent atrial fibrillation: Secondary | ICD-10-CM | POA: Diagnosis not present

## 2017-08-21 LAB — CBC
Hematocrit: 38.3 % (ref 37.5–51.0)
Hemoglobin: 13 g/dL (ref 13.0–17.7)
MCH: 32.4 pg (ref 26.6–33.0)
MCHC: 33.9 g/dL (ref 31.5–35.7)
MCV: 96 fL (ref 79–97)
Platelets: 134 10*3/uL — ABNORMAL LOW (ref 150–450)
RBC: 4.01 x10E6/uL — ABNORMAL LOW (ref 4.14–5.80)
RDW: 14.1 % (ref 12.3–15.4)
WBC: 5 10*3/uL (ref 3.4–10.8)

## 2017-08-21 LAB — BASIC METABOLIC PANEL
BUN/Creatinine Ratio: 21 (ref 10–24)
BUN: 19 mg/dL (ref 8–27)
CO2: 22 mmol/L (ref 20–29)
Calcium: 9.3 mg/dL (ref 8.6–10.2)
Chloride: 106 mmol/L (ref 96–106)
Creatinine, Ser: 0.89 mg/dL (ref 0.76–1.27)
GFR calc Af Amer: 93 mL/min/{1.73_m2} (ref >59)
GFR calc non Af Amer: 81 mL/min/{1.73_m2} (ref >59)
Glucose: 94 mg/dL (ref 65–99)
Potassium: 4.2 mmol/L (ref 3.5–5.2)
Sodium: 141 mmol/L (ref 134–144)
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
## 2017-08-21 NOTE — Patient Instructions (Signed)
We will be checking the following labs today - BMET and CBC  Fasting labs on return   Medication Instructions:    Continue with your current medicines.     Testing/Procedures To Be Arranged:  Echocardiogram - based on what this shows - we may send you to see Dr. Burt Knack  Follow-Up:   See me in 4 months    Other Special Instructions:   N/A    If you need a refill on your cardiac medications before your next appointment, please call your pharmacy.   Call the [place] office at [phone] if you have any questions, problems or concerns.

## 2017-08-22 ENCOUNTER — Ambulatory Visit
Admission: RE | Admit: 2017-08-22 | Discharge: 2017-08-22 | Disposition: A | Discharge status: Home / Self Care | Payer: Medicare Other | Source: Ambulatory Visit | Attending: Family Medicine | Admitting: Family Medicine

## 2017-08-22 DIAGNOSIS — R6889 Other general symptoms and signs: Secondary | ICD-10-CM

## 2017-09-06 ENCOUNTER — Other Ambulatory Visit: Payer: Self-pay

## 2017-09-06 ENCOUNTER — Ambulatory Visit (HOSPITAL_COMMUNITY): Payer: Medicare Other | Attending: Cardiology

## 2017-09-06 DIAGNOSIS — I272 Pulmonary hypertension, unspecified: Secondary | ICD-10-CM | POA: Insufficient documentation

## 2017-09-06 DIAGNOSIS — I340 Nonrheumatic mitral (valve) insufficiency: Secondary | ICD-10-CM

## 2017-09-06 DIAGNOSIS — I371 Nonrheumatic pulmonary valve insufficiency: Secondary | ICD-10-CM | POA: Diagnosis not present

## 2017-09-06 DIAGNOSIS — Z952 Presence of prosthetic heart valve: Secondary | ICD-10-CM | POA: Insufficient documentation

## 2017-09-06 DIAGNOSIS — I081 Rheumatic disorders of both mitral and tricuspid valves: Secondary | ICD-10-CM | POA: Insufficient documentation

## 2017-09-06 DIAGNOSIS — I071 Rheumatic tricuspid insufficiency: Secondary | ICD-10-CM | POA: Diagnosis not present

## 2017-09-06 DIAGNOSIS — I4891 Unspecified atrial fibrillation: Secondary | ICD-10-CM | POA: Diagnosis not present

## 2017-09-06 DIAGNOSIS — E119 Type 2 diabetes mellitus without complications: Secondary | ICD-10-CM | POA: Diagnosis not present

## 2017-09-06 DIAGNOSIS — Z8673 Personal history of transient ischemic attack (TIA), and cerebral infarction without residual deficits: Secondary | ICD-10-CM | POA: Insufficient documentation

## 2017-09-08 ENCOUNTER — Other Ambulatory Visit: Payer: Self-pay | Admitting: Nurse Practitioner

## 2017-09-12 ENCOUNTER — Other Ambulatory Visit: Payer: Self-pay

## 2017-09-12 MED ORDER — ATENOLOL 25 MG PO TABS: 12.5000 mg | ORAL_TABLET | Freq: Every day | ORAL | 3 refills | Status: DC

## 2017-09-12 NOTE — Addendum Note (Signed)
Addended by: Gar Ponto on: 09/12/2017 03:13 PM   Modules accepted: Orders

## 2017-09-18 NOTE — Telephone Encounter (Signed)
Error   This encounter was created in error - please disregard. 

## 2017-09-19 ENCOUNTER — Encounter: Payer: Self-pay | Admitting: Physician Assistant

## 2017-09-20 ENCOUNTER — Ambulatory Visit: Payer: Medicare Other | Admitting: Cardiovascular Disease

## 2017-09-20 ENCOUNTER — Encounter: Payer: Self-pay | Admitting: Cardiovascular Disease

## 2017-09-20 VITALS — BP 142/88 | HR 76 | Ht 71.0 in | Wt 166.8 lb

## 2017-09-20 DIAGNOSIS — I5032 Chronic diastolic (congestive) heart failure: Secondary | ICD-10-CM

## 2017-09-20 DIAGNOSIS — I340 Nonrheumatic mitral (valve) insufficiency: Secondary | ICD-10-CM | POA: Diagnosis not present

## 2017-09-20 LAB — CBC WITH DIFFERENTIAL/PLATELET
Basophils Absolute: 0 10*3/uL (ref 0.0–0.2)
Basos: 1 %
EOS (ABSOLUTE): 0.2 10*3/uL (ref 0.0–0.4)
Eos: 4 %
Hematocrit: 38.2 % (ref 37.5–51.0)
Hemoglobin: 13.6 g/dL (ref 13.0–17.7)
Immature Grans (Abs): 0 10*3/uL (ref 0.0–0.1)
Immature Granulocytes: 0 %
Lymphocytes Absolute: 2.3 10*3/uL (ref 0.7–3.1)
Lymphs: 39 %
MCH: 32.5 pg (ref 26.6–33.0)
MCHC: 35.6 g/dL (ref 31.5–35.7)
MCV: 91 fL (ref 79–97)
Monocytes Absolute: 0.6 10*3/uL (ref 0.1–0.9)
Monocytes: 9 %
Neutrophils Absolute: 2.8 10*3/uL (ref 1.4–7.0)
Neutrophils: 47 %
Platelets: 137 10*3/uL — ABNORMAL LOW (ref 150–450)
RBC: 4.18 x10E6/uL (ref 4.14–5.80)
RDW: 12.7 % (ref 12.3–15.4)
WBC: 6 10*3/uL (ref 3.4–10.8)

## 2017-09-20 LAB — BASIC METABOLIC PANEL
BUN/Creatinine Ratio: 16 (ref 10–24)
BUN: 15 mg/dL (ref 8–27)
CO2: 21 mmol/L (ref 20–29)
Calcium: 9.4 mg/dL (ref 8.6–10.2)
Chloride: 104 mmol/L (ref 96–106)
Creatinine, Ser: 0.95 mg/dL (ref 0.76–1.27)
GFR calc Af Amer: 87 mL/min/{1.73_m2} (ref >59)
GFR calc non Af Amer: 75 mL/min/{1.73_m2} (ref >59)
Glucose: 103 mg/dL — ABNORMAL HIGH (ref 65–99)
Potassium: 4.3 mmol/L (ref 3.5–5.2)
Sodium: 141 mmol/L (ref 134–144)

## 2017-09-20 NOTE — Patient Instructions (Signed)
Medication Instructions:  Your provider recommends that you continue on your current medications as directed. Please refer to the Current Medication list given to you today.    Labwork: TODAY: BMET, CBC  Testing/Procedures: Your physician has requested that you have a TEE. During a TEE, sound waves are used to create images of your heart. It provides your doctor with information about the size and shape of your heart and how well your heart's chambers and valves are working. In this test, a transducer is attached to the end of a flexible tube that's guided down your throat and into your esophagus (the tube leading from you mouth to your stomach) to get a more detailed image of your heart. You are not awake for the procedure. Please see the instruction sheet given to you today. For further information please visit [email or web address].  Your physician has requested that you have a cardiac catheterization. Cardiac catheterization is used to diagnose and/or treat various heart conditions. Doctors may recommend this procedure for a number of different reasons. The most common reason is to evaluate chest pain. Chest pain can be a symptom of coronary artery disease (CAD), and cardiac catheterization can show whether plaque is narrowing or blocking your heart's arteries. This procedure is also used to evaluate the valves, as well as measure the blood flow and oxygen levels in different parts of your heart. For further information please visit [email or web address]. Please follow instruction sheet, as given.  Follow-Up: Ander Purpura, the Structural Heart Navigator, will call you to arrange further appointments.  Any Other Special Instructions Will Be Listed Below (If Applicable).     [facility] OFFICE [place], [place] [address] Dept: [phone] Loc: [place]  09/20/2017    You are  scheduled for a TEE and Cardiac Catheterization on Monday, October 7. Dr. Jenkins Rouge will perform the TEE and Dr. [name] will perform your catheterization.  1. Please arrive at the [facility] (Main Entrance A) at [facility]: [address] [place], [place] [address] between 6:00-6:30AM. Free valet parking service is available.   Special note: Every effort is made to have your procedure done on time. Please understand that emergencies sometimes delay scheduled procedures.  2. Diet: No food or drink after midnight the night before your procedures.  3. Labs: TODAY!  4. Medication instructions in preparation for your procedure:  1) Take your last dose of Eliquis on Friday night, October 4. You will be told when to resume.  2) TAKE ASPIRIN 81 mg the morning of your procedures  3) Take your other medications (except Eliquis) as directed with sips of water the morning of your procedures.  5. Plan for one night stay--bring personal belongings. 6. Bring a current list of your medications and current insurance cards. 7. You MUST have a responsible person to drive you home. 8. Someone MUST be with you the first 24 hours after you arrive home or your discharge will be delayed. 9. Please wear clothes that are easy to get on and off and wear slip-on shoes.  Thank you for allowing [place] to care for you!   -- [facility]

## 2017-09-20 NOTE — Progress Notes (Signed)
Cardiology Office Note:    Date:  09/20/2017   ID:  [name], DOB Jul 17, 1935, MRN [id number]  PCP:  Leighton Ruff, MD  Cardiologist:  No primary care provider on file.  Electrophysiologist:  None   Referring MD: Leighton Ruff, MD   Chief Complaint  Patient presents with  . Shortness of Breath   History of Present Illness:    [name] is a 82 y.o. male with a hx of aortic valve disease s/p bioprosthetic AVR and persistent atrial fibrillation, now presenting for evaluation of severe mitral regurgitation, referred by Truitt Merle, NP.   He is here with his wife today. Originally he developed progressive fatigue, shortness of breath, and exercise intolerance, leading to a diagnosis of severe aortic insufficiency. He was treated with a minimally invasive aortic valve replacement using a 25 mm Edwards Magna-Ease pericardial tissue valve in 2012 by Dr Roxy Manns. He had an uncomplicated course with the exception of post-operative atrial fibrillation. He was lost to follow-up after heart surgery and returned in 2018, reestablishing with Truitt Merle, NP.  The patient was initially seen in the spring 2018 he was noted to be in sinus rhythm.  An echo study demonstrated normal function of his aortic bioprosthesis and mild mitral regurgitation with continued medical therapy recommended.  He presented about 6 months later with altered mental status, aphasia, and right gaze deviation.  He was diagnosed with an acute stroke involving the right basal ganglia and he underwent TPA and neuro intervention.  He has had a good functional recovery.  At the time of his stroke presentation, he was noted to be in atrial fibrillation and was started on oral anticoagulation with apixaban at that time.  He subsequently been managed with a strategy of rate control and anticoagulation, now in atrial fibrillation for the past year. On serial echo studies since 2018, the patient's LV function has been  normal/vigorous, and the degree of mitral regurgitation has worsened, most recently shown to be moderately severe.   He has been physically active over the years, but has 'slowed down' this past year. He is not able to walk as fast and is limited by fatigue and shortness of breath.  He denies chest pain, leg swelling, orthopnea, PND.  He is been very active in the past, but more recently has had difficulty keeping up with his wife when they go for walks.  Past Medical History:  Diagnosis Date  . Bladder stones   . Borderline diabetes   . BPH (benign prostatic hyperplasia)   . Coronary artery disease    cardiologist-  dr Nat Math gerhart NP--- per cath 06-02-2010 non-obstructive cad pLAD 30-40%  . Diverticulosis of colon   . Gout   . History of adenomatous polyp of colon    2002-- tubular adenoma  . History of aortic insufficiency    severe -- s/p  AVR 08-03-2010  . History of small bowel obstruction    02/ 2007 mechanical sbo s/p  surgical intervention;  partial sbo 09/ 2011 and 03-20-2011 resolved without surgical intervention  . History of urinary retention   . HTN (hypertension)   . Peripheral neuropathy   . S/P aortic valve replacement with prosthetic valve 08/03/2010   tissue valve  . S/P patent foramen ovale closure 08/03/2010   at same time AVR  . Stroke [facility])     Past Surgical History:  Procedure Laterality Date  . CARDIAC CATHETERIZATION  06-02-2010  dr Marlou Porch   non-obstructive cad- pLAD  30-40%/  normal LVSF/  severe AI  . CARDIOVASCULAR STRESS TEST  04/12/2016   Low risk nuclear perfusion study w/ no significant reversible ischemia/  normal LV function and wall motion ,  stress ef 60%/  64mm inferior and lateral scooped ST-segment depression w/ exercise (may be repolarization abnormality), exercise capacity was moderately reduced  . CATARACT EXTRACTION W/ INTRAOCULAR LENS  IMPLANT, BILATERAL  02/2010  . CYSTOSCOPY WITH LITHOLAPAXY N/A 06/05/2016   Procedure:  CYSTOSCOPY WITH LITHOLAPAXY and fulgarization of bladder neck;  Surgeon: Irine Seal, MD;  Location: [facility];  Service: Urology;  Laterality: N/A;  . EXPLORATORY LAPARTOMY /  CHOLECYSTECTOMY  02/28/2005   for Small  bowel obstruction (mechnical)  . IR ANGIO EXTRACRAN SEL COM CAROTID INNOMINATE UNI L MOD SED  10/24/2016  . IR ANGIO VERTEBRAL SEL SUBCLAVIAN INNOMINATE BILAT MOD SED  10/24/2016  . IR PERCUTANEOUS ART THROMBECTOMY/INFUSION INTRACRANIAL INC DIAG ANGIO  10/24/2016  . IR RADIOLOGIST EVAL & MGMT  12/05/2016  . LEFT KNEE ARTHROSCOPY  2006  . RADIOLOGY WITH ANESTHESIA N/A 10/24/2016   Procedure: RADIOLOGY WITH ANESTHESIA;  Surgeon: Luanne Bras, MD;  Location: [place];  Service: Radiology;  Laterality: N/A;  . RIGHT FOOT SURGERY    . RIGHT MINIATURE ANTERIOR THORACOTOMY FOR AORTIC VALVE REPLACEMENT AND CLOSURE PATENT FORAMEN OVALE  08-03-2010  DR Levada Schilling Magna-ease pericardial tissue valve (15mm)  . TRANSTHORACIC ECHOCARDIOGRAM  05/30/2016  dr skains   moderate  LVH ef 60-65%/  bioprothesis aortic valve present ,normal grandient and no AI /  mild MV calcification , moderate MR /  mild PR/ moderate TR/  PASP 22mmHg/ (RA denisty was identified 04-27-2016 echo) and is seen again today, this is likely a promient eustacian ridge, atrium is normal size    Current Medications: Current Meds  Medication Sig  . allopurinol (ZYLOPRIM) 300 MG tablet Take 150 mg by mouth daily.  Marland Kitchen amLODipine (NORVASC) 10 MG tablet Take 1 tablet (10 mg total) daily by mouth.  Marland Kitchen apixaban (ELIQUIS) 5 MG TABS tablet Take 1 tablet (5 mg total) by mouth 2 (two) times daily.  Marland Kitchen atenolol (TENORMIN) 25 MG tablet Take 0.5 tablets (12.5 mg total) by mouth daily.  Marland Kitchen b complex vitamins tablet Take 1 tablet by mouth daily.  Marland Kitchen ENSURE (ENSURE) Take 237 mLs by mouth daily.  . finasteride (PROSCAR) 5 MG tablet Take 1 tablet (5 mg total) by mouth daily.  . tamsulosin (FLOMAX) 0.4 MG CAPS capsule Take  2 capsules (0.8 mg total) by mouth daily after breakfast.     Allergies:   Sulfa antibiotics   Social History   Socioeconomic History  . Marital status: Married    Spouse name: Not on file  . Number of children: 2  . Years of education: Not on file  . Highest education level: Not on file  Occupational History  . Occupation: RETIRED  Social Needs  . Financial resource strain: Not on file  . Food insecurity:    Worry: Not on file    Inability: Not on file  . Transportation needs:    Medical: Not on file    Non-medical: Not on file  Tobacco Use  . Smoking status: Never Smoker  . Smokeless tobacco: Never Used  Substance and Sexual Activity  . Alcohol use: Yes    Comment: ONE OR TWO PER MONTH  . Drug use: No  . Sexual activity: Not on file  Lifestyle  . Physical activity:  Days per week: Not on file    Minutes per session: Not on file  . Stress: Not on file  Relationships  . Social connections:    Talks on phone: Not on file    Gets together: Not on file    Attends religious service: Not on file    Active member of club or organization: Not on file    Attends meetings of clubs or organizations: Not on file    Relationship status: Not on file  Other Topics Concern  . Not on file  Social History Narrative  . Not on file     Family History: The patient's family history includes Brain cancer in his father; Heart disease in his mother.  ROS:   Please see the history of present illness.    Positive for back pain. All other systems reviewed and are negative.  EKGs/Labs/Other Studies Reviewed:    The following studies were reviewed today: 2D Echo: Study Conclusions  - Left ventricle: The cavity size was normal. There was severe   concentric hypertrophy. Systolic function was normal. The   estimated ejection fraction was in the range of 60% to 65%. Wall   motion was normal; there were no regional wall motion   abnormalities. The study was not technically  sufficient to allow   evaluation of LV diastolic dysfunction due to atrial   fibrillation. - Aortic valve: A bioprosthesis was present and functioning   normally. Valve area (VTI): 1.39 cm^2. Valve area (Vmax): 1.4   cm^2. Valve area (Vmean): 1.41 cm^2. - Mitral valve: Calcified annulus. There was moderate to severe   regurgitation directed eccentrically, toward the septum, and   along the left atrial wall. - Left atrium: The atrium was severely dilated. - Right atrium: The atrium was severely dilated. - Tricuspid valve: There was moderate regurgitation. - Pulmonary arteries: PA peak pressure: 51 mm Hg (S). - Line: A venous catheter was visualized in the superior vena cava,   with its tip in the right atrium. No abnormal features noted.  Impressions:  - Normal LV size and function with severe LVH. Cannot assess   diastolic function due to afib. Stable aortic valve bioprosthesis   with mean AVG 64mmHg. No AR noted. Moderate pulmonary HTN. There   is moderate to severe MR eccentrically directed towards the   septum which appears to have progressed from prior echo. Consider   TEE for further evaulation. The right ventricular systolic   pressure was increased consistent with moderate pulmonary   hypertension.  EKG:  EKG is not ordered today.    Recent Labs: 10/27/2016: Magnesium 1.8 10/30/2016: ALT 16 05/02/2017: TSH 1.920 08/21/2017: BUN 19; Creatinine, Ser 0.89; Hemoglobin 13.0; Platelets 134; Potassium 4.2; Sodium 141  Recent Lipid Panel    Component Value Date/Time   CHOL 87 10/25/2016 0317   TRIG 54 10/25/2016 0317   HDL 26 (L) 10/25/2016 0317   CHOLHDL 3.3 10/25/2016 0317   VLDL 11 10/25/2016 0317   LDLCALC 50 10/25/2016 0317    Physical Exam:    VS:  BP (!) 142/88   Pulse 76   Ht 5\' 11"  (1.803 m)   Wt 166 lb 12.8 oz (75.7 kg)   SpO2 96%   BMI 23.26 kg/m     Wt Readings from Last 3 Encounters:  09/20/17 166 lb 12.8 oz (75.7 kg)  08/21/17 167 lb 12.8 oz (76.1  kg)  05/01/17 164 lb 1.9 oz (74.4 kg)     GEN:  Well  nourished, well developed, appears younger than his stated age, in no acute distress HEENT: Normal NECK: No JVD; No carotid bruits LYMPHATICS: No lymphadenopathy CARDIAC: irregularly irregular, 4/6 holosystolic murmur at the apex RESPIRATORY:  Clear to auscultation without rales, wheezing or rhonchi  ABDOMEN: Soft, non-tender, non-distended MUSCULOSKELETAL:  No edema; No deformity  SKIN: Warm and dry NEUROLOGIC:  Alert and oriented x 3 PSYCHIATRIC:  Normal affect   Procedure: Isolated MVR   Risk of Mortality: 7.006%  Renal Failure: 2.152%  Permanent Stroke: 4.517%  Prolonged Ventilation: 14.065%  DSW Infection: 0.104%  Reoperation: 8.981%  Morbidity or Mortality: 22.817%  Short Length of Stay: 12.246%  Long Length of Stay: 13.446%    Procedure: MV Repair   Risk of Mortality: 3.651%  Renal Failure: 1.810%  Permanent Stroke: 1.534%  Prolonged Ventilation: 10.347%  DSW Infection: 0.054%  Reoperation: 5.646%  Morbidity or Mortality: 18.207%  Short Length of Stay:18.680%  Long Length of Stay: 8.464%   ASSESSMENT:    1. Mitral valve insufficiency, unspecified etiology   2. Chronic diastolic heart failure (HCC)    PLAN:    In order of problems listed above:  1. The patient has severe, stage D, mitral regurgitation.  I have personally reviewed his echo images which demonstrate severe eccentric mitral regurgitation directed towards the septal wall of the left atrium.  I suspect the primary etiology of his mitral regurgitation involves degenerative changes with posterior leaflet prolapse but some degree of bileaflet prolapse.  I have reviewed the natural history of severe mitral regurgitation with the patient today.  This is likely contributing to his left atrial dilatation and atrial fibrillation as well.  Treatment options are reviewed and included ongoing palliative medical therapy, surgical valve repair plus or minus  Maze procedure, or percutaneous edge to edge mitral valve repair with MitraClip.  Pertinent comorbid conditions include persistent atrial fibrillation, history of stroke, and prior aortic valve replacement via a minithoracotomy.  I recommended proceeding with a transesophageal echo study to better define the specific etiology mitral valve regurgitation and potential treatment options. Risks/indications/alternatives of TEE are reviewed with the patient and his wife today. They agree to proceed. We also discussed proceeding with a R/L heart catheterization to assess hemodynamics and presence of obstructive CAD. I have reviewed the risks, indications, and alternatives to cardiac catheterization, possible angioplasty, and stenting with the patient. Risks include but are not limited to bleeding, infection, vascular injury, stroke, myocardial infection, arrhythmia, kidney injury, radiation-related injury in the case of prolonged fluoroscopy use, emergency cardiac surgery, and death. The patient understands the risks of serious complication is 1-2 in 6144 with diagnostic cardiac cath and 1-2% or less with angioplasty/stenting. Will arrange these studies to be done on the same date. Following cath and TEE studies, the patient will be referred to Dr Roxy Manns for formal cardiac surgical evaluation as part of a multidisciplinary approach to his valvular heart disease.    Medication Adjustments/Labs and Tests Ordered: Current medicines are reviewed at length with the patient today.  Concerns regarding medicines are outlined above.  Orders Placed This Encounter  Procedures  . Basic metabolic panel  . CBC with Differential/Platelet   No orders of the defined types were placed in this encounter.   Patient Instructions  Medication Instructions:  Your provider recommends that you continue on your current medications as directed. Please refer to the Current Medication list given to you today.    Labwork: TODAY: BMET,  CBC  Testing/Procedures: Your physician has requested that you  have a TEE. During a TEE, sound waves are used to create images of your heart. It provides your doctor with information about the size and shape of your heart and how well your heart's chambers and valves are working. In this test, a transducer is attached to the end of a flexible tube that's guided down your throat and into your esophagus (the tube leading from you mouth to your stomach) to get a more detailed image of your heart. You are not awake for the procedure. Please see the instruction sheet given to you today. For further information please visit [email or web address].  Your physician has requested that you have a cardiac catheterization. Cardiac catheterization is used to diagnose and/or treat various heart conditions. Doctors may recommend this procedure for a number of different reasons. The most common reason is to evaluate chest pain. Chest pain can be a symptom of coronary artery disease (CAD), and cardiac catheterization can show whether plaque is narrowing or blocking your heart's arteries. This procedure is also used to evaluate the valves, as well as measure the blood flow and oxygen levels in different parts of your heart. For further information please visit [email or web address]. Please follow instruction sheet, as given.  Follow-Up: Ander Purpura, the Structural Heart Navigator, will call you to arrange further appointments.  Any Other Special Instructions Will Be Listed Below (If Applicable).     [place] [facility] [address] [place] [address] Dept: [phone] Loc: [place]  09/20/2017    You are scheduled for a TEE and Cardiac Catheterization on Monday, October 7. Dr. Jenkins Rouge will perform the TEE and Dr. [name] will perform your catheterization.  1. Please arrive at the [facility]  (Main Entrance A) at [facility]: [address] [place], [place] [address] between 6:00-6:30AM. Free valet parking service is available.   Special note: Every effort is made to have your procedure done on time. Please understand that emergencies sometimes delay scheduled procedures.  2. Diet: No food or drink after midnight the night before your procedures.  3. Labs: TODAY!  4. Medication instructions in preparation for your procedure:  1) Take your last dose of Eliquis on Friday night, October 4. You will be told when to resume.  2) TAKE ASPIRIN 81 mg the morning of your procedures  3) Take your other medications (except Eliquis) as directed with sips of water the morning of your procedures.  5. Plan for one night stay--bring personal belongings. 6. Bring a current list of your medications and current insurance cards. 7. You MUST have a responsible person to drive you home. 8. Someone MUST be with you the first 24 hours after you arrive home or your discharge will be delayed. 9. Please wear clothes that are easy to get on and off and wear slip-on shoes.  Thank you for allowing [place] to care for you!   -- [facility] Invasive Cardiovascular services     Signed, [name], MD  09/20/2017 12:25 PM    [place]

## 2017-09-20 NOTE — H&P (View-Only) (Signed)
Cardiology Office Note:    Date:  09/20/2017   ID:  [name], DOB [birth year], MRN [id number]  PCP:  Leighton Ruff, MD  Cardiologist:  No primary care provider on file.  Electrophysiologist:  None   Referring MD: Leighton Ruff, MD   Chief Complaint  Patient presents with  . Shortness of Breath   History of Present Illness:    [name] is a 82 y.o. male with a hx of aortic valve disease s/p bioprosthetic AVR and persistent atrial fibrillation, now presenting for evaluation of severe mitral regurgitation, referred by Truitt Merle, NP.   He is here with his wife today. Originally he developed progressive fatigue, shortness of breath, and exercise intolerance, leading to a diagnosis of severe aortic insufficiency. He was treated with a minimally invasive aortic valve replacement using a 25 mm Edwards Magna-Ease pericardial tissue valve in 2012 by Dr Roxy Manns. He had an uncomplicated course with the exception of post-operative atrial fibrillation. He was lost to follow-up after heart surgery and returned in 2018, reestablishing with Truitt Merle, NP.  The patient was initially seen in the spring 2018 he was noted to be in sinus rhythm.  An echo study demonstrated normal function of his aortic bioprosthesis and mild mitral regurgitation with continued medical therapy recommended.  He presented about 6 months later with altered mental status, aphasia, and right gaze deviation.  He was diagnosed with an acute stroke involving the right basal ganglia and he underwent TPA and neuro intervention.  He has had a good functional recovery.  At the time of his stroke presentation, he was noted to be in atrial fibrillation and was started on oral anticoagulation with apixaban at that time.  He subsequently been managed with a strategy of rate control and anticoagulation, now in atrial fibrillation for the past year. On serial echo studies since 2018, the patient's LV function has been  normal/vigorous, and the degree of mitral regurgitation has worsened, most recently shown to be moderately severe.   He has been physically active over the years, but has 'slowed down' this past year. He is not able to walk as fast and is limited by fatigue and shortness of breath.  He denies chest pain, leg swelling, orthopnea, PND.  He is been very active in the past, but more recently has had difficulty keeping up with his wife when they go for walks.  Past Medical History:  Diagnosis Date  . Bladder stones   . Borderline diabetes   . BPH (benign prostatic hyperplasia)   . Coronary artery disease    cardiologist-  dr Nat Math gerhart NP--- per cath 06-02-2010 non-obstructive cad pLAD 30-40%  . Diverticulosis of colon   . Gout   . History of adenomatous polyp of colon    2002-- tubular adenoma  . History of aortic insufficiency    severe -- s/p  AVR 08-03-2010  . History of small bowel obstruction    02/ 2007 mechanical sbo s/p  surgical intervention;  partial sbo 09/ 2011 and 03-20-2011 resolved without surgical intervention  . History of urinary retention   . HTN (hypertension)   . Peripheral neuropathy   . S/P aortic valve replacement with prosthetic valve 08/03/2010   tissue valve  . S/P patent foramen ovale closure 08/03/2010   at same time AVR  . Stroke [facility])     Past Surgical History:  Procedure Laterality Date  . CARDIAC CATHETERIZATION  06-02-2010  dr Marlou Porch   non-obstructive cad- pLAD  30-40%/  normal LVSF/  severe AI  . CARDIOVASCULAR STRESS TEST  04/12/2016   Low risk nuclear perfusion study w/ no significant reversible ischemia/  normal LV function and wall motion ,  stress ef 60%/  47mm inferior and lateral scooped ST-segment depression w/ exercise (may be repolarization abnormality), exercise capacity was moderately reduced  . CATARACT EXTRACTION W/ INTRAOCULAR LENS  IMPLANT, BILATERAL  02/2010  . CYSTOSCOPY WITH LITHOLAPAXY N/A 06/05/2016   Procedure:  CYSTOSCOPY WITH LITHOLAPAXY and fulgarization of bladder neck;  Surgeon: Irine Seal, MD;  Location: [facility];  Service: Urology;  Laterality: N/A;  . EXPLORATORY LAPARTOMY /  CHOLECYSTECTOMY  02/28/2005   for Small  bowel obstruction (mechnical)  . IR ANGIO EXTRACRAN SEL COM CAROTID INNOMINATE UNI L MOD SED  10/24/2016  . IR ANGIO VERTEBRAL SEL SUBCLAVIAN INNOMINATE BILAT MOD SED  10/24/2016  . IR PERCUTANEOUS ART THROMBECTOMY/INFUSION INTRACRANIAL INC DIAG ANGIO  10/24/2016  . IR RADIOLOGIST EVAL & MGMT  12/05/2016  . LEFT KNEE ARTHROSCOPY  2006  . RADIOLOGY WITH ANESTHESIA N/A 10/24/2016   Procedure: RADIOLOGY WITH ANESTHESIA;  Surgeon: Luanne Bras, MD;  Location: [facility];  Service: Radiology;  Laterality: N/A;  . RIGHT FOOT SURGERY    . RIGHT MINIATURE ANTERIOR THORACOTOMY FOR AORTIC VALVE REPLACEMENT AND CLOSURE PATENT FORAMEN OVALE  08-03-2010  DR Levada Schilling Magna-ease pericardial tissue valve (67mm)  . TRANSTHORACIC ECHOCARDIOGRAM  05/30/2016  dr skains   moderate  LVH ef 60-65%/  bioprothesis aortic valve present ,normal grandient and no AI /  mild MV calcification , moderate MR /  mild PR/ moderate TR/  PASP 86mmHg/ (RA denisty was identified 04-27-2016 echo) and is seen again today, this is likely a promient eustacian ridge, atrium is normal size    Current Medications: Current Meds  Medication Sig  . allopurinol (ZYLOPRIM) 300 MG tablet Take 150 mg by mouth daily.  Marland Kitchen amLODipine (NORVASC) 10 MG tablet Take 1 tablet (10 mg total) daily by mouth.  Marland Kitchen apixaban (ELIQUIS) 5 MG TABS tablet Take 1 tablet (5 mg total) by mouth 2 (two) times daily.  Marland Kitchen atenolol (TENORMIN) 25 MG tablet Take 0.5 tablets (12.5 mg total) by mouth daily.  Marland Kitchen b complex vitamins tablet Take 1 tablet by mouth daily.  Marland Kitchen ENSURE (ENSURE) Take 237 mLs by mouth daily.  . finasteride (PROSCAR) 5 MG tablet Take 1 tablet (5 mg total) by mouth daily.  . tamsulosin (FLOMAX) 0.4 MG CAPS capsule Take  2 capsules (0.8 mg total) by mouth daily after breakfast.     Allergies:   Sulfa antibiotics   Social History   Socioeconomic History  . Marital status: Married    Spouse name: Not on file  . Number of children: 2  . Years of education: Not on file  . Highest education level: Not on file  Occupational History  . Occupation: RETIRED  Social Needs  . Financial resource strain: Not on file  . Food insecurity:    Worry: Not on file    Inability: Not on file  . Transportation needs:    Medical: Not on file    Non-medical: Not on file  Tobacco Use  . Smoking status: Never Smoker  . Smokeless tobacco: Never Used  Substance and Sexual Activity  . Alcohol use: Yes    Comment: ONE OR TWO PER MONTH  . Drug use: No  . Sexual activity: Not on file  Lifestyle  . Physical activity:  Days per week: Not on file    Minutes per session: Not on file  . Stress: Not on file  Relationships  . Social connections:    Talks on phone: Not on file    Gets together: Not on file    Attends religious service: Not on file    Active member of club or organization: Not on file    Attends meetings of clubs or organizations: Not on file    Relationship status: Not on file  Other Topics Concern  . Not on file  Social History Narrative  . Not on file     Family History: The patient's family history includes Brain cancer in his father; Heart disease in his mother.  ROS:   Please see the history of present illness.    Positive for back pain. All other systems reviewed and are negative.  EKGs/Labs/Other Studies Reviewed:    The following studies were reviewed today: 2D Echo: Study Conclusions  - Left ventricle: The cavity size was normal. There was severe   concentric hypertrophy. Systolic function was normal. The   estimated ejection fraction was in the range of 60% to 65%. Wall   motion was normal; there were no regional wall motion   abnormalities. The study was not technically  sufficient to allow   evaluation of LV diastolic dysfunction due to atrial   fibrillation. - Aortic valve: A bioprosthesis was present and functioning   normally. Valve area (VTI): 1.39 cm^2. Valve area (Vmax): 1.4   cm^2. Valve area (Vmean): 1.41 cm^2. - Mitral valve: Calcified annulus. There was moderate to severe   regurgitation directed eccentrically, toward the septum, and   along the left atrial wall. - Left atrium: The atrium was severely dilated. - Right atrium: The atrium was severely dilated. - Tricuspid valve: There was moderate regurgitation. - Pulmonary arteries: PA peak pressure: 51 mm Hg (S). - Line: A venous catheter was visualized in the superior vena cava,   with its tip in the right atrium. No abnormal features noted.  Impressions:  - Normal LV size and function with severe LVH. Cannot assess   diastolic function due to afib. Stable aortic valve bioprosthesis   with mean AVG 51mmHg. No AR noted. Moderate pulmonary HTN. There   is moderate to severe MR eccentrically directed towards the   septum which appears to have progressed from prior echo. Consider   TEE for further evaulation. The right ventricular systolic   pressure was increased consistent with moderate pulmonary   hypertension.  EKG:  EKG is not ordered today.    Recent Labs: 10/27/2016: Magnesium 1.8 10/30/2016: ALT 16 05/02/2017: TSH 1.920 08/21/2017: BUN 19; Creatinine, Ser 0.89; Hemoglobin 13.0; Platelets 134; Potassium 4.2; Sodium 141  Recent Lipid Panel    Component Value Date/Time   CHOL 87 10/25/2016 0317   TRIG 54 10/25/2016 0317   HDL 26 (L) 10/25/2016 0317   CHOLHDL 3.3 10/25/2016 0317   VLDL 11 10/25/2016 0317   LDLCALC 50 10/25/2016 0317    Physical Exam:    VS:  BP (!) 142/88   Pulse 76   Ht 5\' 11"  (1.803 m)   Wt 166 lb 12.8 oz (75.7 kg)   SpO2 96%   BMI 23.26 kg/m     Wt Readings from Last 3 Encounters:  09/20/17 166 lb 12.8 oz (75.7 kg)  08/21/17 167 lb 12.8 oz (76.1  kg)  05/01/17 164 lb 1.9 oz (74.4 kg)     GEN:  Well  nourished, well developed, appears younger than his stated age, in no acute distress HEENT: Normal NECK: No JVD; No carotid bruits LYMPHATICS: No lymphadenopathy CARDIAC: irregularly irregular, 4/6 holosystolic murmur at the apex RESPIRATORY:  Clear to auscultation without rales, wheezing or rhonchi  ABDOMEN: Soft, non-tender, non-distended MUSCULOSKELETAL:  No edema; No deformity  SKIN: Warm and dry NEUROLOGIC:  Alert and oriented x 3 PSYCHIATRIC:  Normal affect   Procedure: Isolated MVR   Risk of Mortality: 7.006%  Renal Failure: 2.152%  Permanent Stroke: 4.517%  Prolonged Ventilation: 14.065%  DSW Infection: 0.104%  Reoperation: 8.981%  Morbidity or Mortality: 22.817%  Short Length of Stay: 12.246%  Long Length of Stay: 13.446%    Procedure: MV Repair   Risk of Mortality: 3.651%  Renal Failure: 1.810%  Permanent Stroke: 1.534%  Prolonged Ventilation: 10.347%  DSW Infection: 0.054%  Reoperation: 5.646%  Morbidity or Mortality: 18.207%  Short Length of Stay:18.680%  Long Length of Stay: 8.464%   ASSESSMENT:    1. Mitral valve insufficiency, unspecified etiology   2. Chronic diastolic heart failure (HCC)    PLAN:    In order of problems listed above:  1. The patient has severe, stage D, mitral regurgitation.  I have personally reviewed his echo images which demonstrate severe eccentric mitral regurgitation directed towards the septal wall of the left atrium.  I suspect the primary etiology of his mitral regurgitation involves degenerative changes with posterior leaflet prolapse but some degree of bileaflet prolapse.  I have reviewed the natural history of severe mitral regurgitation with the patient today.  This is likely contributing to his left atrial dilatation and atrial fibrillation as well.  Treatment options are reviewed and included ongoing palliative medical therapy, surgical valve repair plus or minus  Maze procedure, or percutaneous edge to edge mitral valve repair with MitraClip.  Pertinent comorbid conditions include persistent atrial fibrillation, history of stroke, and prior aortic valve replacement via a minithoracotomy.  I recommended proceeding with a transesophageal echo study to better define the specific etiology mitral valve regurgitation and potential treatment options. Risks/indications/alternatives of TEE are reviewed with the patient and his wife today. They agree to proceed. We also discussed proceeding with a R/L heart catheterization to assess hemodynamics and presence of obstructive CAD. I have reviewed the risks, indications, and alternatives to cardiac catheterization, possible angioplasty, and stenting with the patient. Risks include but are not limited to bleeding, infection, vascular injury, stroke, myocardial infection, arrhythmia, kidney injury, radiation-related injury in the case of prolonged fluoroscopy use, emergency cardiac surgery, and death. The patient understands the risks of serious complication is 1-2 in 0981 with diagnostic cardiac cath and 1-2% or less with angioplasty/stenting. Will arrange these studies to be done on the same date. Following cath and TEE studies, the patient will be referred to Dr Roxy Manns for formal cardiac surgical evaluation as part of a multidisciplinary approach to his valvular heart disease.    Medication Adjustments/Labs and Tests Ordered: Current medicines are reviewed at length with the patient today.  Concerns regarding medicines are outlined above.  Orders Placed This Encounter  Procedures  . Basic metabolic panel  . CBC with Differential/Platelet   No orders of the defined types were placed in this encounter.   Patient Instructions  Medication Instructions:  Your provider recommends that you continue on your current medications as directed. Please refer to the Current Medication list given to you today.    Labwork: TODAY: BMET,  CBC  Testing/Procedures: Your physician has requested that you  have a TEE. During a TEE, sound waves are used to create images of your heart. It provides your doctor with information about the size and shape of your heart and how well your heart's chambers and valves are working. In this test, a transducer is attached to the end of a flexible tube that's guided down your throat and into your esophagus (the tube leading from you mouth to your stomach) to get a more detailed image of your heart. You are not awake for the procedure. Please see the instruction sheet given to you today. For further information please visit [email or web address].  Your physician has requested that you have a cardiac catheterization. Cardiac catheterization is used to diagnose and/or treat various heart conditions. Doctors may recommend this procedure for a number of different reasons. The most common reason is to evaluate chest pain. Chest pain can be a symptom of coronary artery disease (CAD), and cardiac catheterization can show whether plaque is narrowing or blocking your heart's arteries. This procedure is also used to evaluate the valves, as well as measure the blood flow and oxygen levels in different parts of your heart. For further information please visit [email or web address]. Please follow instruction sheet, as given.  Follow-Up: Ander Purpura, the Structural Heart Navigator, will call you to arrange further appointments.  Any Other Special Instructions Will Be Listed Below (If Applicable).     [facility] [place], [place] [address] Dept: [phone] Loc: [place]  09/20/2017    You are scheduled for a TEE and Cardiac Catheterization on Monday, October 7. Dr. Jenkins Rouge will perform the TEE and Dr. [name] will perform your catheterization.  1. Please arrive at the [facility]  (Main Entrance A) at [facility]: [address] [place], [place] [address] between 6:00-6:30AM. Free valet parking service is available.   Special note: Every effort is made to have your procedure done on time. Please understand that emergencies sometimes delay scheduled procedures.  2. Diet: No food or drink after midnight the night before your procedures.  3. Labs: TODAY!  4. Medication instructions in preparation for your procedure:  1) Take your last dose of Eliquis on Friday night, October 4. You will be told when to resume.  2) TAKE ASPIRIN 81 mg the morning of your procedures  3) Take your other medications (except Eliquis) as directed with sips of water the morning of your procedures.  5. Plan for one night stay--bring personal belongings. 6. Bring a current list of your medications and current insurance cards. 7. You MUST have a responsible person to drive you home. 8. Someone MUST be with you the first 24 hours after you arrive home or your discharge will be delayed. 9. Please wear clothes that are easy to get on and off and wear slip-on shoes.  Thank you for allowing [place] to care for you!   -- [facility] Invasive Cardiovascular services     Signed, [name], MD  09/20/2017 12:25 PM    [place]

## 2017-10-01 ENCOUNTER — Ambulatory Visit: Payer: Medicare Other | Admitting: Nurse Practitioner

## 2017-10-09 ENCOUNTER — Telehealth: Payer: Self-pay | Admitting: *Deleted

## 2017-10-09 NOTE — Telephone Encounter (Signed)
Pt contacted pre-catheterization scheduled at [facility] for: Monday October 14, 2017 9 AM-TEE 7:30 AM Verified arrival time and place: [place] Entrance A at: 6:30 AM  Nothing to eat or drink after midnight prior to procedures.   Hold: Eliquis-10/12/17 until post procedures. (last dose evening 10/11/17)  Except hold medication AM meds can be  taken pre-cath with sip of water including: ASA 81 mg  Confirmed patient has responsible person to drive home post procedure and for 24 hours after you arrive home: yes

## 2017-10-14 ENCOUNTER — Encounter (HOSPITAL_COMMUNITY): Payer: Self-pay | Admitting: *Deleted

## 2017-10-14 ENCOUNTER — Ambulatory Visit (HOSPITAL_COMMUNITY)
Admission: RE | Admit: 2017-10-14 | Discharge: 2017-10-14 | Disposition: A | Discharge status: Home / Self Care | Payer: Medicare Other | Source: Ambulatory Visit | Attending: Cardiovascular Disease | Admitting: Cardiovascular Disease

## 2017-10-14 ENCOUNTER — Ambulatory Visit (HOSPITAL_BASED_OUTPATIENT_CLINIC_OR_DEPARTMENT_OTHER)
Admission: RE | Admit: 2017-10-14 | Discharge: 2017-10-14 | Disposition: A | Discharge status: Home / Self Care | Payer: Medicare Other | Source: Ambulatory Visit | Attending: Cardiovascular Disease | Admitting: Cardiovascular Disease

## 2017-10-14 ENCOUNTER — Other Ambulatory Visit: Payer: Self-pay

## 2017-10-14 ENCOUNTER — Encounter (HOSPITAL_COMMUNITY)
Admission: RE | Disposition: A | Discharge status: Home / Self Care | Payer: Self-pay | Source: Ambulatory Visit | Attending: Cardiovascular Disease

## 2017-10-14 DIAGNOSIS — I081 Rheumatic disorders of both mitral and tricuspid valves: Secondary | ICD-10-CM | POA: Insufficient documentation

## 2017-10-14 DIAGNOSIS — R0602 Shortness of breath: Secondary | ICD-10-CM | POA: Diagnosis not present

## 2017-10-14 DIAGNOSIS — I4891 Unspecified atrial fibrillation: Secondary | ICD-10-CM | POA: Diagnosis not present

## 2017-10-14 DIAGNOSIS — R7303 Prediabetes: Secondary | ICD-10-CM | POA: Diagnosis not present

## 2017-10-14 DIAGNOSIS — Z8249 Family history of ischemic heart disease and other diseases of the circulatory system: Secondary | ICD-10-CM | POA: Insufficient documentation

## 2017-10-14 DIAGNOSIS — Z79899 Other long term (current) drug therapy: Secondary | ICD-10-CM | POA: Diagnosis not present

## 2017-10-14 DIAGNOSIS — Z9889 Other specified postprocedural states: Secondary | ICD-10-CM | POA: Diagnosis not present

## 2017-10-14 DIAGNOSIS — G629 Polyneuropathy, unspecified: Secondary | ICD-10-CM | POA: Insufficient documentation

## 2017-10-14 DIAGNOSIS — I2510 Atherosclerotic heart disease of native coronary artery without angina pectoris: Secondary | ICD-10-CM | POA: Insufficient documentation

## 2017-10-14 DIAGNOSIS — M109 Gout, unspecified: Secondary | ICD-10-CM | POA: Diagnosis not present

## 2017-10-14 DIAGNOSIS — I340 Nonrheumatic mitral (valve) insufficiency: Secondary | ICD-10-CM | POA: Diagnosis present

## 2017-10-14 DIAGNOSIS — I5032 Chronic diastolic (congestive) heart failure: Secondary | ICD-10-CM | POA: Insufficient documentation

## 2017-10-14 DIAGNOSIS — Z86010 Personal history of colonic polyps: Secondary | ICD-10-CM | POA: Diagnosis not present

## 2017-10-14 DIAGNOSIS — Z882 Allergy status to sulfonamides status: Secondary | ICD-10-CM | POA: Insufficient documentation

## 2017-10-14 DIAGNOSIS — Z8673 Personal history of transient ischemic attack (TIA), and cerebral infarction without residual deficits: Secondary | ICD-10-CM | POA: Diagnosis not present

## 2017-10-14 DIAGNOSIS — I420 Dilated cardiomyopathy: Secondary | ICD-10-CM | POA: Diagnosis not present

## 2017-10-14 DIAGNOSIS — I272 Pulmonary hypertension, unspecified: Secondary | ICD-10-CM | POA: Insufficient documentation

## 2017-10-14 DIAGNOSIS — Z9842 Cataract extraction status, left eye: Secondary | ICD-10-CM | POA: Insufficient documentation

## 2017-10-14 DIAGNOSIS — Z953 Presence of xenogenic heart valve: Secondary | ICD-10-CM | POA: Insufficient documentation

## 2017-10-14 DIAGNOSIS — I253 Aneurysm of heart: Secondary | ICD-10-CM | POA: Diagnosis not present

## 2017-10-14 DIAGNOSIS — Z7901 Long term (current) use of anticoagulants: Secondary | ICD-10-CM | POA: Insufficient documentation

## 2017-10-14 DIAGNOSIS — N400 Enlarged prostate without lower urinary tract symptoms: Secondary | ICD-10-CM | POA: Insufficient documentation

## 2017-10-14 DIAGNOSIS — I110 Hypertensive heart disease with heart failure: Secondary | ICD-10-CM | POA: Diagnosis not present

## 2017-10-14 DIAGNOSIS — Z8719 Personal history of other diseases of the digestive system: Secondary | ICD-10-CM | POA: Diagnosis not present

## 2017-10-14 DIAGNOSIS — Z9049 Acquired absence of other specified parts of digestive tract: Secondary | ICD-10-CM | POA: Diagnosis not present

## 2017-10-14 DIAGNOSIS — Z9841 Cataract extraction status, right eye: Secondary | ICD-10-CM | POA: Diagnosis not present

## 2017-10-14 HISTORY — PX: TEE WITHOUT CARDIOVERSION: SHX5443

## 2017-10-14 HISTORY — PX: RIGHT/LEFT HEART CATH AND CORONARY ANGIOGRAPHY: CATH118266

## 2017-10-14 LAB — POCT I-STAT 3, ART BLOOD GAS (G3+)
Acid-base deficit: 3 mmol/L — ABNORMAL HIGH (ref 0.0–2.0)
Bicarbonate: 21 mmol/L (ref 20.0–28.0)
O2 Saturation: 98 %
TCO2: 22 mmol/L (ref 22–32)
pCO2 arterial: 34 mmHg (ref 32.0–48.0)
pH, Arterial: 7.399 (ref 7.350–7.450)
pO2, Arterial: 103 mmHg (ref 83.0–108.0)

## 2017-10-14 LAB — POCT I-STAT 3, VENOUS BLOOD GAS (G3P V)
Acid-base deficit: 3 mmol/L — ABNORMAL HIGH (ref 0.0–2.0)
Bicarbonate: 22.2 mmol/L (ref 20.0–28.0)
O2 Saturation: 68 %
TCO2: 23 mmol/L (ref 22–32)
pCO2, Ven: 41.5 mmHg — ABNORMAL LOW (ref 44.0–60.0)
pH, Ven: 7.336 (ref 7.250–7.430)
pO2, Ven: 38 mmHg (ref 32.0–45.0)

## 2017-10-14 LAB — POCT ACTIVATED CLOTTING TIME: Activated Clotting Time: 197 seconds

## 2017-10-14 LAB — GLUCOSE, CAPILLARY: Glucose-Capillary: 77 mg/dL (ref 70–99)

## 2017-10-14 SURGERY — ECHOCARDIOGRAM, TRANSESOPHAGEAL
Anesthesia: Moderate Sedation

## 2017-10-14 SURGERY — RIGHT/LEFT HEART CATH AND CORONARY ANGIOGRAPHY
Anesthesia: LOCAL

## 2017-10-14 MED ORDER — SODIUM CHLORIDE 0.9% FLUSH: 3.0000 mL | INTRAVENOUS | Status: DC | PRN

## 2017-10-14 MED ORDER — FENTANYL CITRATE (PF) 100 MCG/2ML IJ SOLN
INTRAMUSCULAR | Status: DC | PRN
  Administered 2017-10-14: 50 ug via INTRAVENOUS

## 2017-10-14 MED ORDER — HEPARIN SODIUM (PORCINE) 1000 UNIT/ML IJ SOLN
INTRAMUSCULAR | Status: AC
  Filled 2017-10-14: qty 1

## 2017-10-14 MED ORDER — HEPARIN SODIUM (PORCINE) 1000 UNIT/ML IJ SOLN
INTRAMUSCULAR | Status: DC | PRN
  Administered 2017-10-14: 3000 [IU] via INTRAVENOUS

## 2017-10-14 MED ORDER — MIDAZOLAM HCL 10 MG/2ML IJ SOLN
INTRAMUSCULAR | Status: DC | PRN
  Administered 2017-10-14 (×2): 2 mg via INTRAVENOUS

## 2017-10-14 MED ORDER — ACETAMINOPHEN 325 MG PO TABS: 650.0000 mg | ORAL_TABLET | ORAL | Status: DC | PRN

## 2017-10-14 MED ORDER — SODIUM CHLORIDE 0.9 % IV SOLN: INTRAVENOUS | Status: DC

## 2017-10-14 MED ORDER — LIDOCAINE HCL (PF) 1 % IJ SOLN
INTRAMUSCULAR | Status: AC
  Filled 2017-10-14: qty 30

## 2017-10-14 MED ORDER — MIDAZOLAM HCL 2 MG/2ML IJ SOLN
INTRAMUSCULAR | Status: AC
  Filled 2017-10-14: qty 2

## 2017-10-14 MED ORDER — FENTANYL CITRATE (PF) 100 MCG/2ML IJ SOLN
INTRAMUSCULAR | Status: AC
  Filled 2017-10-14: qty 2

## 2017-10-14 MED ORDER — MIDAZOLAM HCL 2 MG/2ML IJ SOLN
INTRAMUSCULAR | Status: AC
  Filled 2017-10-14: qty 10

## 2017-10-14 MED ORDER — ONDANSETRON HCL 4 MG/2ML IJ SOLN: 4.0000 mg | Freq: Four times a day (QID) | INTRAMUSCULAR | Status: DC | PRN

## 2017-10-14 MED ORDER — VERAPAMIL HCL 2.5 MG/ML IV SOLN
INTRAVENOUS | Status: DC | PRN
  Administered 2017-10-14: 10 mL via INTRA_ARTERIAL

## 2017-10-14 MED ORDER — VERAPAMIL HCL 2.5 MG/ML IV SOLN
INTRAVENOUS | Status: AC
  Filled 2017-10-14: qty 2

## 2017-10-14 MED ORDER — SODIUM CHLORIDE 0.9 % IV SOLN
INTRAVENOUS | Status: AC | PRN
  Administered 2017-10-14: 500 mL via INTRAVENOUS

## 2017-10-14 MED ORDER — SODIUM CHLORIDE 0.9 % WEIGHT BASED INFUSION: 1.0000 mL/kg/h | INTRAVENOUS | Status: DC

## 2017-10-14 MED ORDER — SODIUM CHLORIDE 0.9% FLUSH: 3.0000 mL | Freq: Two times a day (BID) | INTRAVENOUS | Status: DC

## 2017-10-14 MED ORDER — HEPARIN (PORCINE) IN NACL 1000-0.9 UT/500ML-% IV SOLN
INTRAVENOUS | Status: DC | PRN
  Administered 2017-10-14 (×2): 500 mL

## 2017-10-14 MED ORDER — FENTANYL CITRATE (PF) 100 MCG/2ML IJ SOLN
INTRAMUSCULAR | Status: DC | PRN
  Administered 2017-10-14: 25 ug via INTRAVENOUS

## 2017-10-14 MED ORDER — IOHEXOL 350 MG/ML SOLN
INTRAVENOUS | Status: DC | PRN
  Administered 2017-10-14: 40 mL via INTRAVENOUS

## 2017-10-14 MED ORDER — BUTAMBEN-TETRACAINE-BENZOCAINE 2-2-14 % EX AERO
INHALATION_SPRAY | CUTANEOUS | Status: DC | PRN
  Administered 2017-10-14: 2 via TOPICAL

## 2017-10-14 MED ORDER — MIDAZOLAM HCL 2 MG/2ML IJ SOLN
INTRAMUSCULAR | Status: DC | PRN
  Administered 2017-10-14: 1 mg via INTRAVENOUS

## 2017-10-14 MED ORDER — HEPARIN (PORCINE) IN NACL 1000-0.9 UT/500ML-% IV SOLN
INTRAVENOUS | Status: AC
  Filled 2017-10-14: qty 1000

## 2017-10-14 MED ORDER — SODIUM CHLORIDE 0.9 % IV SOLN: 250.0000 mL | INTRAVENOUS | Status: DC | PRN

## 2017-10-14 MED ORDER — LIDOCAINE HCL (PF) 1 % IJ SOLN
INTRAMUSCULAR | Status: DC | PRN
  Administered 2017-10-14: 20 mL
  Administered 2017-10-14: 5 mL

## 2017-10-14 SURGICAL SUPPLY — 14 items
CATH 5FR JL3.5 JR4 ANG PIG MP (CATHETERS) ×2 IMPLANT
CATH SWAN GANZ 7F STRAIGHT (CATHETERS) ×2 IMPLANT
DEVICE RAD COMP TR BAND LRG (VASCULAR PRODUCTS) ×2 IMPLANT
GLIDESHEATH SLEND SS 6F .021 (SHEATH) ×2 IMPLANT
GUIDEWIRE INQWIRE 1.5J.035X260 (WIRE) ×1 IMPLANT
INQWIRE 1.5J .035X260CM (WIRE) ×2
KIT HEART LEFT (KITS) ×2 IMPLANT
PACK CARDIAC CATHETERIZATION (CUSTOM PROCEDURE TRAY) ×2 IMPLANT
SHEATH GLIDE SLENDER 4/5FR (SHEATH) ×2 IMPLANT
SHEATH PINNACLE 7F 10CM (SHEATH) ×2 IMPLANT
SHEATH PROBE COVER 6X72 (BAG) ×2 IMPLANT
TRANSDUCER W/STOPCOCK (MISCELLANEOUS) ×2 IMPLANT
TUBING CIL FLEX 10 FLL-RA (TUBING) ×2 IMPLANT
WIRE EMERALD 3MM-J .035X150CM (WIRE) ×2 IMPLANT

## 2017-10-14 NOTE — Discharge Instructions (Signed)
Drink plenty of fluids for next 48 hours and keep right wrist elevated at heart level for 24 hours HOLD ELIQUIS today. May resume tomorrow 10/8  Radial Site Care Refer to this sheet in the next few weeks. These instructions provide you with information about caring for yourself after your procedure. Your health care provider may also give you more specific instructions. Your treatment has been planned according to current medical practices, but problems sometimes occur. Call your health care provider if you have any problems or questions after your procedure. What can I expect after the procedure? After your procedure, it is typical to have the following:  Bruising at the radial site that usually fades within 1-2 weeks.  Blood collecting in the tissue (hematoma) that may be painful to the touch. It should usually decrease in size and tenderness within 1-2 weeks.  Follow these instructions at home:  Take medicines only as directed by your health care provider.  You may shower 24-48 hours after the procedure or as directed by your health care provider. Remove the bandage (dressing) and gently wash the site with plain soap and water. Pat the area dry with a clean towel. Do not rub the site, because this may cause bleeding.  Do not take baths, swim, or use a hot tub until your health care provider approves.  Check your insertion site every day for redness, swelling, or drainage.  Do not apply powder or lotion to the site.  Do not flex or bend the affected arm for 24 hours or as directed by your health care provider.  Do not push or pull heavy objects with the affected arm for 24 hours or as directed by your health care provider.  Do not lift over 10 lb (4.5 kg) for 5 days after your procedure or as directed by your health care provider.  Ask your health care provider when it is okay to: ? Return to work or school. ? Resume usual physical activities or sports. ? Resume sexual  activity.  Do not drive home if you are discharged the same day as the procedure. Have someone else drive you.  You may drive 24 hours after the procedure unless otherwise instructed by your health care provider.  Do not operate machinery or power tools for 24 hours after the procedure.  If your procedure was done as an outpatient procedure, which means that you went home the same day as your procedure, a responsible adult should be with you for the first 24 hours after you arrive home.  Keep all follow-up visits as directed by your health care provider. This is important. Contact a health care provider if:  You have a fever.  You have chills.  You have increased bleeding from the radial site. Hold pressure on the site. Get help right away if:  You have unusual pain at the radial site.  You have redness, warmth, or swelling at the radial site.  You have drainage (other than a small amount of blood on the dressing) from the radial site.  The radial site is bleeding, and the bleeding does not stop after 30 minutes of holding steady pressure on the site.  Your arm or hand becomes pale, cool, tingly, or numb. This information is not intended to replace advice given to you by your health care provider. Make sure you discuss any questions you have with your health care provider. Document Released: 01/27/2010 Document Revised: 06/02/2015 Document Reviewed: 07/13/2013 Elsevier Interactive Patient Education  2018 Else[employer]  [employer]. Femoral Site Care Refer to this sheet in the next few weeks. These instructions provide you with information about caring for yourself after your procedure. Your health care provider may also give you more specific instructions. Your treatment has been planned according to current medical practices, but problems sometimes occur. Call your health care provider if you have any problems or questions after your procedure. What can I expect after the procedure? After your  procedure, it is typical to have the following:  Bruising at the site that usually fades within 1-2 weeks.  Blood collecting in the tissue (hematoma) that may be painful to the touch. It should usually decrease in size and tenderness within 1-2 weeks.  Follow these instructions at home:  Take medicines only as directed by your health care provider.  You may shower 24-48 hours after the procedure or as directed by your health care provider. Remove the bandage (dressing) and gently wash the site with plain soap and water. Pat the area dry with a clean towel. Do not rub the site, because this may cause bleeding.  Do not take baths, swim, or use a hot tub until your health care provider approves.  Check your insertion site every day for redness, swelling, or drainage.  Do not apply powder or lotion to the site.  Limit use of stairs to twice a day for the first 2-3 days or as directed by your health care provider.  Do not squat for the first 2-3 days or as directed by your health care provider.  Do not lift over 10 lb (4.5 kg) for 5 days after your procedure or as directed by your health care provider.  Ask your health care provider when it is okay to: ? Return to work or school. ? Resume usual physical activities or sports. ? Resume sexual activity.  Do not drive home if you are discharged the same day as the procedure. Have someone else drive you.  You may drive 24 hours after the procedure unless otherwise instructed by your health care provider.  Do not operate machinery or power tools for 24 hours after the procedure or as directed by your health care provider.  If your procedure was done as an outpatient procedure, which means that you went home the same day as your procedure, a responsible adult should be with you for the first 24 hours after you arrive home.  Keep all follow-up visits as directed by your health care provider. This is important. Contact a health care provider  if:  You have a fever.  You have chills.  You have increased bleeding from the site. Hold pressure on the site. Get help right away if:  You have unusual pain at the site.  You have redness, warmth, or swelling at the site.  You have drainage (other than a small amount of blood on the dressing) from the site.  The site is bleeding, and the bleeding does not stop after 30 minutes of holding steady pressure on the site.  Your leg or foot becomes pale, cool, tingly, or numb. This information is not intended to replace advice given to you by your health care provider. Make sure you discuss any questions you have with your health care provider. Document Released: 08/28/2013 Document Revised: 06/02/2015 Document Reviewed: 07/14/2013 Elsevier Interactive Patient Education  [employer].

## 2017-10-14 NOTE — Interval H&P Note (Signed)
History and Physical Interval Note:  10/14/2017 7:18 AM  [name]  has presented today for surgery, with the diagnosis of mitral regurgitation  The various methods of treatment have been discussed with the patient and family. After consideration of risks, benefits and other options for treatment, the patient has consented to  Procedure(s): TRANSESOPHAGEAL ECHOCARDIOGRAM (TEE) (N/A) as a surgical intervention .  The patient's history has been reviewed, patient examined, no change in status, stable for surgery.  I have reviewed the patient's chart and labs.  Questions were answered to the patient's satisfaction.     Jenkins Rouge

## 2017-10-14 NOTE — Progress Notes (Signed)
Patient took 81mg  aspirin at 0530 at home.  Vista Lawman, RN

## 2017-10-14 NOTE — Progress Notes (Signed)
*  PRELIMINARY RESULTS* Echocardiogram Echocardiogram Transesophageal has been performed.  [name] 10/14/2017, 8:45 AM

## 2017-10-14 NOTE — Progress Notes (Signed)
Site area: rt groin fv sheath Site Prior to Removal:  Level 0 Pressure Applied For: 10 minutes Manual:   yes Patient Status During Pull:  stable Post Pull Site:  Level 0 Post Pull Instructions Given:   yes Post Pull Pulses Present: rt dp palpable Dressing Applied:  Gauze and tegaderm Bedrest begins @ 7207 Comments:

## 2017-10-14 NOTE — CV Procedure (Signed)
TEE: During this procedure the patient is administered a total of Versed 4 mg and Fentanyl 50 mg to achieve and maintain moderate conscious sedation.  The patient's heart rate, blood pressure, and oxygen saturation are monitored continuously during the procedure. The period of conscious sedation is 35 minutes, of which I was present face-to-face 100% of this time.  Severe MR with P2 scallop prolapse flail gap .50 cm  Normal bioprosthetic AVR Severe TR ? Cor triatriatum RA Atrial septal aneurysm.  EF 65% Mild RVE No LAA thrombus  Patient would appear to be a candidate for mitral clip The atrial septal aneurysm and RA web may  Make trans-septal puncture challenging   Jenkins Rouge

## 2017-10-14 NOTE — Interval H&P Note (Signed)
History and Physical Interval Note:  10/14/2017 8:58 AM  [name]  has presented today for surgery, with the diagnosis of mr  The various methods of treatment have been discussed with the patient and family. After consideration of risks, benefits and other options for treatment, the patient has consented to  Procedure(s): RIGHT/LEFT HEART CATH AND CORONARY ANGIOGRAPHY (N/A) as a surgical intervention .  The patient's history has been reviewed, patient examined, no change in status, stable for surgery.  I have reviewed the patient's chart and labs.  Questions were answered to the patient's satisfaction.     [name]

## 2017-10-15 ENCOUNTER — Encounter: Payer: Self-pay | Admitting: Thoracic Surgery (Cardiothoracic Vascular Surgery)

## 2017-10-15 ENCOUNTER — Other Ambulatory Visit: Payer: Self-pay

## 2017-10-15 ENCOUNTER — Institutional Professional Consult (permissible substitution): Payer: Medicare Other | Admitting: Thoracic Surgery (Cardiothoracic Vascular Surgery)

## 2017-10-15 VITALS — BP 140/88 | HR 70 | Resp 18 | Ht 71.0 in | Wt 162.8 lb

## 2017-10-15 DIAGNOSIS — I480 Paroxysmal atrial fibrillation: Secondary | ICD-10-CM

## 2017-10-15 DIAGNOSIS — Z952 Presence of prosthetic heart valve: Secondary | ICD-10-CM | POA: Diagnosis not present

## 2017-10-15 DIAGNOSIS — I340 Nonrheumatic mitral (valve) insufficiency: Secondary | ICD-10-CM

## 2017-10-15 NOTE — Progress Notes (Signed)
[facility] CONSULTATION REPORT  Referring Provider is [name], MD PCP is Leighton Ruff, MD  Chief Complaint  Patient presents with  . New Patient (Initial Visit)    new patient consultation, TEE and Cath review  . Mitral Regurgitation    HPI:  Patient is an 82 year old male status post aortic valve replacement using a bioprosthetic tissue valve in 2012, hypertension, atrial fibrillation on long-term anticoagulation, and previous embolic stroke in [id number] who has been referred for surgical consultation to discuss treatment options for management of mitral valve prolapse with stage D severe symptomatic primary mitral regurgitation and persistent atrial fibrillation.  Patient's cardiac history dates back to 2012 when he underwent aortic valve replacement using a 76mm Edwards Magna Ease stented bovine pericardial tissue valve via right minithoracotomy approach for severe aortic insufficiency.  He recovered uneventfully and was followed for a period of time by Dr. Marlou Porch but subsequently lost to follow-up.  He has been followed since 2018 by Truitt Merle, and previous echocardiograms document the presence of normal functioning bioprosthetic tissue valve in aortic position with mild mitral regurgitation and normal left ventricular systolic function.  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  He was promptly treated with catheter directed therapy and he recovered remarkably well.  Transthoracic echocardiogram performed at that time revealed normal left ventricular function with normal functioning bioprosthetic tissue valve in aortic position and what was felt to be moderate mitral regurgitation.  The patient was notably in atrial fibrillation.  Transesophageal echocardiogram was not performed but the patient was started on  Eliquis for long-term anticoagulation.  He recovered remarkably well from his stroke and has eventually returned to essentially baseline physical status.  He did report decreased exercise tolerance with poor endurance, and he is admitted to some exertional shortness of breath with more strenuous activity.  However, the patient continues to walk every day and he exercises religiously, including doing push-ups and lifting some weights.  Recent follow-up echocardiogram revealed preserved left ventricular function but worsening mitral regurgitation.  He was referred to Dr. Burt Knack for consultation and underwent transesophageal echocardiogram and diagnostic cardiac catheterization on October 14, 2017.  TEE revealed normal left ventricular systolic function with normal functioning bioprosthetic tissue valve in aortic position.  There was severe mitral regurgitation with mitral valve prolapse including a flail segment involving the middle scallop of the posterior leaflet.  There was severe left atrial enlargement.  There was severe tricuspid regurgitation with bileaflet prolapse.  Catheterization revealed mild nonobstructive coronary artery disease with no change in comparison with catheterization performed in 2012.  There were large V waves on wedge tracing consistent with severe mitral regurgitation.  Pulmonary artery pressures were mildly elevated.  Patient is married and lives locally in [place] with his wife.  He has been retired for many years but has remained physically active and quite vigorous throughout his retirement.  He walks daily and exercises religiously.  He does admit to some exertional shortness of breath but this occurs only with strenuous physical exertion.  He denies any history of exertional chest pain or chest tightness.  He has not had dizziness, palpitations, nor syncopal episodes.  His mobility remains quite good and he reports that he has essentially recovered completely from his stroke with  no significant residual physical limitation with perhaps "very mild" weakness in his left arm.    Past Medical History:  Diagnosis Date  . Bladder stones   . Borderline diabetes   . BPH (benign prostatic hyperplasia)   . Coronary artery disease    cardiologist-  dr Nat Math gerhart NP--- per cath 06-02-2010 non-obstructive cad pLAD 30-40%  . Diverticulosis of colon   . Gout   . History of adenomatous polyp of colon    2002-- tubular adenoma  . History of aortic insufficiency    severe -- s/p  AVR 08-03-2010  . History of small bowel obstruction    02/ 2007 mechanical sbo s/p  surgical intervention;  partial sbo 09/ 2011 and 03-20-2011 resolved without surgical intervention  . History of urinary retention   . HTN (hypertension)   . Peripheral neuropathy   . S/P aortic valve replacement with prosthetic valve 08/03/2010   tissue valve  . S/P patent foramen ovale closure 08/03/2010   at same time AVR  . Stroke [facility])     Past Surgical History:  Procedure Laterality Date  . CARDIAC CATHETERIZATION  06-02-2010  dr Marlou Porch   non-obstructive cad- pLAD 30-40%/  normal LVSF/  severe AI  . CARDIOVASCULAR STRESS TEST  04/12/2016   Low risk nuclear perfusion study w/ no significant reversible ischemia/  normal LV function and wall motion ,  stress ef 60%/  26mm inferior and lateral scooped ST-segment depression w/ exercise (may be repolarization abnormality), exercise capacity was moderately reduced  . CATARACT EXTRACTION W/ INTRAOCULAR LENS  IMPLANT, BILATERAL  02/2010  . CYSTOSCOPY WITH LITHOLAPAXY N/A 06/05/2016   Procedure: CYSTOSCOPY WITH LITHOLAPAXY and fulgarization of bladder neck;  Surgeon: Irine Seal, MD;  Location: [facility];  Service: Urology;  Laterality: N/A;  . EXPLORATORY LAPARTOMY /  CHOLECYSTECTOMY  02/28/2005   for Small  bowel obstruction (mechnical)  . IR ANGIO EXTRACRAN SEL COM CAROTID INNOMINATE UNI L MOD SED  10/24/2016  . IR ANGIO VERTEBRAL SEL  SUBCLAVIAN INNOMINATE BILAT MOD SED  10/24/2016  . IR PERCUTANEOUS ART THROMBECTOMY/INFUSION INTRACRANIAL INC DIAG ANGIO  10/24/2016  . IR RADIOLOGIST EVAL & MGMT  12/05/2016  . LEFT KNEE ARTHROSCOPY  2006  . RADIOLOGY WITH ANESTHESIA N/A 10/24/2016   Procedure: RADIOLOGY WITH ANESTHESIA;  Surgeon: Luanne Bras, MD;  Location: [facility];  Service: Radiology;  Laterality: N/A;  . RIGHT FOOT SURGERY    . RIGHT MINIATURE ANTERIOR THORACOTOMY FOR AORTIC VALVE REPLACEMENT AND CLOSURE PATENT FORAMEN OVALE  08-03-2010  DR Levada Schilling Magna-ease pericardial tissue valve (66mm)  . RIGHT/LEFT HEART CATH AND CORONARY ANGIOGRAPHY N/A 10/14/2017   Procedure: RIGHT/LEFT HEART CATH AND CORONARY ANGIOGRAPHY;  Surgeon: [name], MD;  Location: [facility];  Service: Cardiovascular;  Laterality: N/A;  . TRANSTHORACIC ECHOCARDIOGRAM  05/30/2016  dr skains   moderate  LVH ef 60-65%/  bioprothesis aortic valve present ,normal grandient and no AI /  mild MV calcification , moderate MR /  mild PR/ moderate TR/  PASP 75mmHg/ (RA denisty was identified 04-27-2016 echo) and is seen again today, this is likely a promient eustacian ridge, atrium is normal size    Family History  Problem Relation Age of Onset  . Heart disease Mother   . Brain cancer Father     Social History   Socioeconomic History  . Marital status: Married    Spouse name: Not on file  . Number of children: 2  . Years of education: Not on file  . Highest education level: Not on file  Occupational History  . Occupation: RETIRED  Social Needs  . Financial resource strain: Not on file  . Food insecurity:    Worry: Not on file    Inability: Not on file  . Transportation needs:    Medical: Not on file    Non-medical: Not on file  Tobacco Use  . Smoking status: Never Smoker  . Smokeless tobacco: Never Used  Substance and Sexual Activity  . Alcohol use: Yes    Comment: ONE OR TWO PER MONTH  . Drug use: No  . Sexual  activity: Not on file  Lifestyle  . Physical activity:    Days per week: Not on file    Minutes per session: Not on file  . Stress: Not on file  Relationships  . Social connections:    Talks on phone: Not on file    Gets together: Not on file    Attends religious service: Not on file    Active member of club or organization: Not on file    Attends meetings of clubs or organizations: Not on file    Relationship status: Not on file  . Intimate partner violence:    Fear of current or ex partner: Not on file    Emotionally abused: Not on file    Physically abused: Not on file    Forced sexual activity: Not on file  Other Topics Concern  . Not on file  Social History Narrative  . Not on file    Current Outpatient Medications  Medication Sig Dispense Refill  . allopurinol (ZYLOPRIM) 300 MG tablet Take 150 mg by mouth daily.    Marland Kitchen amLODipine (NORVASC) 10 MG tablet Take 1 tablet (10 mg total) daily by mouth. 90 tablet 3  . apixaban (ELIQUIS) 5 MG TABS tablet Take 1 tablet (5 mg total) by mouth 2 (two) times daily. 180 tablet 3  . atenolol (TENORMIN) 25 MG tablet Take 0.5 tablets (12.5 mg total) by mouth daily. 45 tablet 3  . b complex vitamins tablet Take 1 tablet by mouth daily.    . finasteride (PROSCAR) 5 MG tablet Take 1 tablet (5 mg total) by mouth daily. 30 tablet 0  . protein supplement shake (PREMIER PROTEIN) LIQD Take 11 oz by mouth daily.    . tamsulosin (FLOMAX) 0.4 MG CAPS capsule Take 2 capsules (0.8 mg total) by mouth daily after breakfast. 30 capsule 0   No current facility-administered medications for this visit.     Allergies  Allergen Reactions  . Sulfa Antibiotics Other (See Comments)    Granulocytosis      Review of Systems:   General:  normal appetite, decreased energy, no weight gain, no weight loss, no fever  Cardiac:  no chest pain with exertion, no chest pain at rest, +SOB with strenuous exertion, no resting SOB, no PND, no orthopnea, no palpitations,  + arrhythmia, + atrial fibrillation, no LE edema, no dizzy spells, no syncope  Respiratory:  no shortness of breath, no home oxygen, no productive cough, no dry cough, no bronchitis, no wheezing, no hemoptysis, no asthma, no pain with inspiration or cough, no sleep apnea, no CPAP at night  GI:   no difficulty swallowing, no reflux, no frequent heartburn, no hiatal hernia, no abdominal pain, no constipation, no diarrhea, no hematochezia, no hematemesis, no melena  GU:   no dysuria,  no frequency, no urinary tract infection, no hematuria, + enlarged prostate, no kidney stones, no kidney disease  Vascular:  no pain suggestive of claudication, no pain in feet, no leg  cramps, no varicose veins, no DVT, no non-healing foot ulcer  Neuro:   + stroke, no TIA's, no seizures, no headaches, no temporary blindness one eye,  no slurred speech, no peripheral neuropathy, no chronic pain, no instability of gait, no memory/cognitive dysfunction  Musculoskeletal: no arthritis, no joint swelling, no myalgias, no difficulty walking, normal mobility   Skin:   + rash, + itching, no skin infections, no pressure sores or ulcerations  Psych:   no anxiety, no depression, no nervousness, no unusual recent stress  Eyes:   no blurry vision, no floaters, no recent vision changes, + wears glasses or contacts  ENT:   no hearing loss, no loose or painful teeth, no dentures, last saw dentist September 2019  Hematologic:  + easy bruising, + abnormal bleeding, no clotting disorder, no frequent epistaxis  Endocrine:  no diabetes, does not check CBG's at home           Physical Exam:   BP 140/88 (BP Location: Left Arm, Patient Position: Sitting, Cuff Size: Normal)   Pulse 70   Resp 18   Ht 5\' 11"  (1.803 m)   Wt 162 lb 12.8 oz (73.8 kg)   SpO2 97% Comment: RA  BMI 22.71 kg/m   General:  Thin,  well-appearing, appears younger than stated age  54:  Unremarkable   Neck:   no JVD, no bruits, no adenopathy   Chest:   clear to  auscultation, symmetrical breath sounds, no wheezes, no rhonchi   CV:   Irregular rate and rhythm, grade III/VI holosystolic murmur heard best at apex,  no diastolic murmur  Abdomen:  soft, non-tender, no masses   Extremities:  warm, well-perfused, pulses palpable, no LE edema  Rectal/GU  Deferred  Neuro:   Grossly non-focal and symmetrical throughout  Skin:   Clean and dry, no rashes, no breakdown   Diagnostic Tests:  Transthoracic Echocardiography  Patient:    Dudley, Mages MR #:       [id number] Study Date: 09/06/2017 Gender:     M Age:        82 Height:     181.6 cm Weight:     76.1 kg BSA:        1.96 m^2 Pt. Status: Room:   ATTENDING    Fransico Him, MD  SONOGRAPHER  Diamond Nickel  REFERRING    Leighton Ruff [id number]  [facility] Moment, [place]    [place], [place], Outpatient  cc:  ------------------------------------------------------------------- LV EF: 60% -   65%  ------------------------------------------------------------------- Indications:      I05.9 Mitral valve disorder. I34.0 Mitral valve insufficiency. I07.1 Tricuspid valve insufficiency.  ------------------------------------------------------------------- History:   PMH:  Aortic valve replacement. PFO repair.  Stroke. Risk factors:  Diabetes mellitus.  ------------------------------------------------------------------- Study Conclusions  - Left ventricle: The cavity size was normal. There was severe   concentric hypertrophy. Systolic function was normal. The   estimated ejection fraction was in the range of 60% to 65%. Wall   motion was normal; there were no regional wall motion   abnormalities. The study was not technically sufficient to allow   evaluation of LV diastolic dysfunction due to atrial   fibrillation. - Aortic valve: A bioprosthesis was present and functioning   normally. Valve area (VTI): 1.39 cm^2. Valve area (Vmax): 1.4   cm^2.  Valve area (Vmean): 1.41 cm^2. - Mitral valve: Calcified annulus. There was moderate to severe   regurgitation directed  eccentrically, toward the septum, and   along the left atrial wall. - Left atrium: The atrium was severely dilated. - Right atrium: The atrium was severely dilated. - Tricuspid valve: There was moderate regurgitation. - Pulmonary arteries: PA peak pressure: 51 mm Hg (S). - Line: A venous catheter was visualized in the superior vena cava,   with its tip in the right atrium. No abnormal features noted.  Impressions:  - Normal LV size and function with severe LVH. Cannot assess   diastolic function due to afib. Stable aortic valve bioprosthesis   with mean AVG 36mmHg. No AR noted. Moderate pulmonary HTN. There   is moderate to severe MR eccentrically directed towards the   septum which appears to have progressed from prior echo. Consider   TEE for further evaulation. The right ventricular systolic   pressure was increased consistent with moderate pulmonary   hypertension.  ------------------------------------------------------------------- Study data:  Comparison was made to the study of 10/27/2016.  Study status:  Routine.  Procedure:  The patient reported no pain pre or post test. Transthoracic echocardiography. Image quality was adequate.  Study completion:  There were no complications. Transthoracic echocardiography.  M-mode, complete 2D, spectral Doppler, and color Doppler.  Birthdate:  Patient birthdate: 26-Mar-1935.  Age:  Patient is 82 yr old.  Sex:  Gender: male. BMI: 23.1 kg/m^2.  Blood pressure:     123/81  Patient status: Outpatient.  Study date:  Study date: 09/06/2017. Study time: 07:34 AM.  Location:  [facility]  -------------------------------------------------------------------  ------------------------------------------------------------------- Left ventricle:  The cavity size was normal. There was severe concentric hypertrophy.  Systolic function was normal. The estimated ejection fraction was in the range of 60% to 65%. Wall motion was normal; there were no regional wall motion abnormalities. Normal sinus rhythm was absent. The study was not technically sufficient to allow evaluation of LV diastolic dysfunction due to atrial fibrillation.  ------------------------------------------------------------------- Aortic valve:   Normal thickness leaflets. A bioprosthesis was present and functioning normally. Mobility was not restricted. Doppler:  Transvalvular velocity was within the normal range. There was no stenosis. There was no regurgitation.    VTI ratio of LVOT to aortic valve: 0.44. Valve area (VTI): 1.39 cm^2. Indexed valve area (VTI): 0.71 cm^2/m^2. Peak velocity ratio of LVOT to aortic valve: 0.45. Valve area (Vmax): 1.4 cm^2. Indexed valve area (Vmax): 0.71 cm^2/m^2. Mean velocity ratio of LVOT to aortic valve: 0.45. Valve area (Vmean): 1.41 cm^2. Indexed valve area (Vmean): 0.72 cm^2/m^2.    Mean gradient (S): 9 mm Hg. Peak gradient (S): 20 mm Hg.  ------------------------------------------------------------------- Aorta:  Aortic root: The aortic root was normal in size.  ------------------------------------------------------------------- Mitral valve:   Calcified annulus. Mobility was not restricted. Doppler:  Transvalvular velocity was within the normal range. There was no evidence for stenosis. There was moderate to severe regurgitation directed eccentrically, toward the septum, and along the left atrial wall.    Valve area by pressure half-time: 5.37 cm^2. Indexed valve area by pressure half-time: 2.74 cm^2/m^2. Peak gradient (D): 4 mm Hg.  ------------------------------------------------------------------- Left atrium:  The atrium was severely dilated.  ------------------------------------------------------------------- Right ventricle:  The cavity size was normal. Wall thickness  was normal. Systolic function was normal.  ------------------------------------------------------------------- Pulmonic valve:    Structurally normal valve.   Cusp separation was normal.  Doppler:  Transvalvular velocity was within the normal range. There was no evidence for stenosis. There was mild to moderate regurgitation.  ------------------------------------------------------------------- Tricuspid valve:   Structurally normal valve.  Doppler: Transvalvular velocity was within the normal range. There was moderate regurgitation.  ------------------------------------------------------------------- Pulmonary artery:   The main pulmonary artery was normal-sized. Systolic pressure was within the normal range.  ------------------------------------------------------------------- Right atrium:  The atrium was severely dilated.  ------------------------------------------------------------------- Pericardium:  There was no pericardial effusion.  ------------------------------------------------------------------- Systemic veins: Line: A venous catheter was visualized in the superior vena cava, with its tip in the right atrium. No abnormal features noted. Inferior vena cava: The vessel was dilated. The respirophasic diameter changes were blunted (< 50%), consistent with elevated central venous pressure.  ------------------------------------------------------------------- Measurements   Left ventricle                           Value          Reference  LV ID, ED, PLAX chordal          (L)     38    mm       43 - 52  LV ID, ES, PLAX chordal          (L)     22    mm       23 - 38  LV fx shortening, PLAX chordal           42    %        >=29  LV PW thickness, ED                      17    mm       ----------  IVS/LV PW ratio, ED                      1              <=1.3  Stroke volume, 2D                        62    ml       ----------  Stroke volume/bsa, 2D                     32    ml/m^2   ----------  LV e&', lateral                           18.5  cm/s     ----------  LV E/e&', lateral                         5.36           ----------  LV e&', medial                            8.98  cm/s     ----------  LV E/e&', medial                          11.05          ----------  LV e&', average                           13.74 cm/s     ----------  LV E/e&', average  7.22           ----------    Ventricular septum                       Value          Reference  IVS thickness, ED                        17    mm       ----------    LVOT                                     Value          Reference  LVOT ID, S                               20    mm       ----------  LVOT area                                3.14  cm^2     ----------  LVOT peak velocity, S                    101   cm/s     ----------  LVOT mean velocity, S                    60.2  cm/s     ----------  LVOT VTI, S                              19.9  cm       ----------    Aortic valve                             Value          Reference  Aortic valve peak velocity, S            226   cm/s     ----------  Aortic valve mean velocity, S            134   cm/s     ----------  Aortic valve VTI, S                      44.8  cm       ----------  Aortic mean gradient, S                  9     mm Hg    ----------  Aortic peak gradient, S                  20    mm Hg    ----------  VTI ratio, LVOT/AV                       0.44           ----------  Aortic valve area, VTI                   1.39  cm^2     ----------  Aortic valve area/bsa, VTI               0.71  cm^2/m^2 ----------  Velocity ratio, peak, LVOT/AV            0.45           ----------  Aortic valve area, peak velocity         1.4   cm^2     ----------  Aortic valve area/bsa, peak              0.71  cm^2/m^2 ----------  velocity  Velocity ratio, mean, LVOT/AV            0.45           ----------  Aortic valve area, mean velocity          1.41  cm^2     ----------  Aortic valve area/bsa, mean              0.72  cm^2/m^2 ----------  velocity    Left atrium                              Value          Reference  LA ID, A-P, ES                           53    mm       ----------  LA ID/bsa, A-P                   (H)     2.7   cm/m^2   <=2.2  LA volume, S                             146   ml       ----------  LA volume/bsa, S                         74.5  ml/m^2   ----------  LA volume, ES, 1-p A4C                   138   ml       ----------  LA volume/bsa, ES, 1-p A4C               70.4  ml/m^2   ----------  LA volume, ES, 1-p A2C                   151   ml       ----------  LA volume/bsa, ES, 1-p A2C               77    ml/m^2   ----------    Mitral valve                             Value          Reference  Mitral E-wave peak velocity              99.2  cm/s     ----------  Mitral A-wave peak velocity              30.6  cm/s     ----------  Mitral deceleration time         (  L)     141   ms       150 - 230  Mitral pressure half-time                41    ms       ----------  Mitral peak gradient, D                  4     mm Hg    ----------  Mitral E/A ratio, peak                   3.2            ----------  Mitral valve area, PHT, DP               5.37  cm^2     ----------  Mitral valve area/bsa, PHT, DP           2.74  cm^2/m^2 ----------  Aliasing velocity, MR PISA               35.2  cm/s     ----------  Mitral regurg PISA radius                8     mm       ----------  Mitral regurg VTI, PISA                  186   cm       ----------  Mitral ERO, PISA                         0.26  cm^2     ----------  Mitral regurg volume, PISA               48    ml       ----------    Pulmonary arteries                       Value          Reference  PA pressure, S, DP               (H)     51    mm Hg    <=30    Tricuspid valve                          Value          Reference  Tricuspid regurg peak velocity            299   cm/s     ----------  Tricuspid peak RV-RA gradient            36    mm Hg    ----------    Right atrium                             Value          Reference  RA ID, S-I, ES, A4C              (H)     74.6  mm       34 - 49  RA area, ES, A4C                 (H)     29.7  cm^2  8.3 - 19.5  RA volume, ES, A/L                       97.1  ml       ----------  RA volume/bsa, ES, A/L                   49.5  ml/m^2   ----------    Systemic veins                           Value          Reference  Estimated CVP                            15    mm Hg    ----------    Right ventricle                          Value          Reference  RV pressure, S, DP               (H)     51    mm Hg    <=30  RV s&', lateral, S                        10.7  cm/s     ----------    Pulmonic valve                           Value          Reference  Pulmonic regurg velocity, ED             114   cm/s     ----------  Pulmonic regurg gradient, ED             5     mm Hg    ----------  Legend: (L)  and  (H)  mark values outside specified reference range.  ------------------------------------------------------------------- Prepared and Electronically Authenticated by  Fransico Him, MD 2019-08-30T09:17:11     Transesophageal Echocardiography  Patient:    Kiante, Ciavarella MR #:       [id number] Study Date: 10/14/2017 Gender:     M Age:        82 Height:     180.3 cm Weight:     75.7 kg BSA:        1.95 m^2 Pt. Status: Room:   ADMITTING    Jenkins Rouge, M.D.  PERFORMING   Jenkins Rouge, M.D.  SONOGRAPHER  Dustin Flock, [place]  ATTENDING    [name], MD  ORDERING     [name], MD  REFERRING    [name], MD  cc:  ------------------------------------------------------------------- LV EF: 60% -   65%  ------------------------------------------------------------------- Indications:      Mitral regurgitation  424.0.  ------------------------------------------------------------------- History:   PMH:   Atrial fibrillation.  Aortic valve disease. Mitral valve disease.  Stroke.  Risk factors:  Hypertension.  ------------------------------------------------------------------- Study Conclusions  - Left ventricle: Hypertrophy was noted. Systolic function was   normal. The estimated ejection fraction was in the range of 60%   to 65%. - Aortic valve: Normal appearing bioprosthetic AVR with mean   gradient 9 mmHg no peri valvular regurgitation. - Mitral valve: Severe grade  3-4 MR eccentric anterior due to   prolapse of medial aspect of P2 scallop. - Left atrium: The atrium was dilated. No evidence of thrombus in   the atrial cavity or appendage. - Right ventricle: The cavity size was dilated. - Right atrium: The atrium was dilated. 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 - Tricuspid valve: Bileaflet prolapse with severe TR. There was   severe regurgitation.  ------------------------------------------------------------------- Study data:   Study status:  Routine.  Consent:  The risks, benefits, and alternatives to the procedure were explained to the patient and informed consent was obtained.  Procedure:  The patient reported no pain pre or post test. Initial setup. The patient was brought to the laboratory. Surface ECG leads were monitored. Sedation. Conscious sedation was administered by cardiology staff. Transesophageal echocardiography. Topical anesthesia was obtained using viscous lidocaine. A transesophageal probe was inserted by the attending cardiologistwithout difficulty. Image quality was adequate.  Study completion:  The patient tolerated the procedure well. There were no complications.  Administered medications: Midazolam, 4mg , IV.  Fentanyl, 31mcg, IV.           Diagnostic transesophageal echocardiography.  2D and color Doppler. Birthdate:  Patient birthdate: 1935-05-10.  Age:  Patient is 82 yr old.  Sex:  Gender: male.    BMI: 23.3 kg/m^2.  Blood pressure: 153/84  Patient status:  Inpatient.  Study date:  Study date: 10/14/2017. Study time: 07:30 AM.  Location:  Endoscopy.  -------------------------------------------------------------------  ------------------------------------------------------------------- Left ventricle:  Hypertrophy was noted. Systolic function was normal. The estimated ejection fraction was in the range of 60% to 65%.  ------------------------------------------------------------------- Aortic valve:  Normal appearing bioprosthetic AVR with mean gradient 9 mmHg no peri valvular regurgitation.  Doppler:     Mean gradient (S): 9 mm Hg. Peak gradient (S): 19 mm Hg.  ------------------------------------------------------------------- Mitral valve:  Severe grade 3-4 MR eccentric anterior due to prolapse of medial aspect of P2 scallop.  Doppler:     Mean gradient (D): 40 mm Hg.  ------------------------------------------------------------------- Left atrium:  The atrium was dilated.  No evidence of thrombus in the atrial cavity or appendage.  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  ------------------------------------------------------------------- Right ventricle:  The cavity size was dilated.  ------------------------------------------------------------------- Pulmonic valve:    Doppler:  There was mild regurgitation.  ------------------------------------------------------------------- Tricuspid valve:  Bileaflet prolapse with severe TR.  Doppler: There was severe  regurgitation.  ------------------------------------------------------------------- Right atrium:  The atrium was dilated.  ------------------------------------------------------------------- Pericardium:  The pericardium was normal in appearance. There was no pericardial effusion.  ------------------------------------------------------------------- Measurements   Aortic valve                       Value  Aortic valve peak velocity, S      220   cm/s  Aortic valve mean velocity, S      138   cm/s  Aortic valve VTI, S                47.1  cm  Aortic mean gradient, S            9     mm Hg  Aortic peak gradient, S            19    mm Hg    Mitral valve  Value  Mitral mean velocity, D            240   cm/s  Mitral mean gradient, D            40    mm Hg  Mitral annulus VTI, D              101   cm  Legend: (L)  and  (H)  mark values outside specified reference range.  ------------------------------------------------------------------- Prepared and Electronically Authenticated by  Jenkins Rouge, M.D. 2019-10-07T09:01:26   RIGHT/LEFT HEART CATH AND CORONARY ANGIOGRAPHY  Conclusion     Prox Cx to Mid Cx lesion is 30% stenosed.  Ost LAD to Prox LAD lesion is 30% stenosed.  Prox LAD to Mid LAD lesion is 40% stenosed.   1.  Nonobstructive coronary artery disease with no significant change compared to 2012 cardiac catheterization study 2.  Large V waves in the pulmonary capillary wedge tracing consistent with severe mitral insufficiency  Recommend: referral to cardiac surgery for further evaluation of treatment options (surgical mitral valve repair + Maze versus percutaneous mitral valve repair)  Resume apixaban tomorrow morning   Indications   Severe mitral insufficiency [I34.0 (ICD-10-CM)]  Procedural Details/Technique   Technical Details INDICATION: Severe mitral insufficiency. The patient is an 82 year old male with previous aortic  valve replacement from a minithoracotomy approach in 2012. He has developed progressive mitral insufficiency with associated symptoms of exertional dyspnea and fatigue. He is referred for right and left heart catheterization to evaluate further treatment options for mitral insufficiency.  PROCEDURAL DETAILS: The right wrist was prepped, draped, and anesthetized with 1% lidocaine. Using the modified Seldinger technique, a 5/6 French Slender sheath was introduced into the right radial artery. 3 mg of verapamil was administered through the sheath, weight-based unfractionated heparin was administered intravenously. Standard Judkins catheters were used for selective coronary angiography. Catheter exchanges were performed over an exchange length guidewire. For the right heart catheterization, we were unable to obtain adequate IV access in the arm. Ultrasound guidance was utilized to access the right femoral vein via a front wall puncture. Ultrasound images are captured and stored in the patient's chart. An 8 French sheath is inserted. A Swan-Ganz catheter is used per protocol. Pressure measurements and oxygen saturations are recorded and Fick cardiac output is calculated. There were no immediate procedural complications. A TR band was used for radial hemostasis at the completion of the procedure. The patient was transferred to the post catheterization recovery area for further monitoring.     Estimated blood loss <50 mL.  During this procedure the patient was administered the following to achieve and maintain moderate conscious sedation: Versed 1 mg, Fentanyl 25 mcg, while the patient's heart rate, blood pressure, and oxygen saturation were continuously monitored. The period of conscious sedation was 32 minutes, of which I was present face-to-face 100% of this time.  Coronary Findings   Diagnostic  Dominance: Right  Left Main  There is mild diffuse disease throughout the vessel.  Left Anterior Descending   Ost LAD to Prox LAD lesion 30% stenosed  Ost LAD to Prox LAD lesion is 30% stenosed.  Prox LAD to Mid LAD lesion 40% stenosed  Prox LAD to Mid LAD lesion is 40% stenosed. The LAD is patent throughout. There is mild diffuse stenosis of the proximal vessel without significant obstruction. There is mild stenosis of the mid vessel without high-grade obstruction.  Left Circumflex  Prox Cx to Mid Cx lesion 30% stenosed  Prox Cx  to Mid Cx lesion is 30% stenosed.  Right Coronary Artery  Vessel is large. There is mild diffuse disease throughout the vessel. The vessel is ectatic. The RCA is a large, dominant vessel. The vessel has mild diffuse irregularity with no significant stenosis. There is mild ectasia through the midportion of the RCA. The PDA is a large branch with no significant stenosis.  Intervention   No interventions have been documented.  Left Heart   Aortic Valve The patient has a bioprosthetic aortic valve .  Coronary Diagrams   Diagnostic Diagram       Implants    No implant documentation for this case.  MERGE Images   Show images for CARDIAC CATHETERIZATION   Link to Procedure Log   Procedure Log    Hemo Data    Most Recent Value  Fick Cardiac Output 4.69 L/min  Fick Cardiac Output Index 2.4 (L/min)/BSA  RA A Wave -99 mmHg  RA V Wave 11 mmHg  RA Mean 6 mmHg  RV Systolic Pressure 34 mmHg  RV Diastolic Pressure -2 mmHg  RV EDP 6 mmHg  PA Systolic Pressure 32 mmHg  PA Diastolic Pressure 10 mmHg  PA Mean 20 mmHg  PW A Wave -99 mmHg  PW V Wave 27 mmHg  PW Mean 17 mmHg  AO Systolic Pressure 710 mmHg  AO Diastolic Pressure 52 mmHg  AO Mean 71 mmHg  QP/QS 1  TPVR Index 8.34 HRUI  TSVR Index 29.61 HRUI  PVR SVR Ratio 0.05  TPVR/TSVR Ratio 0.28      Impression:  Patient has mitral valve prolapse with stage D severe symptomatic primary mitral regurgitation and persistent atrial fibrillation dating back at least one year at which time the patient presented  with an acute embolic stroke.  He currently describes stable symptoms of mild exertional shortness of breath and fatigue consistent with chronic diastolic congestive heart failure, New York Heart Association functional class I-II.  I have personally reviewed the patient's recent transthoracic and transesophageal echocardiograms as well as his diagnostic cardiac catheterization.  Echocardiograms demonstrate normal left ventricular systolic function with normal bioprosthetic tissue valve in aortic position.  There is mitral valve prolapse with an obvious flail segment involving the middle scallop of the posterior leaflet of the mitral valve.  The mitral annulus is dilated.  There is type II mitral valve dysfunction with severe mitral regurgitation.  There is severe left atrial enlargement.  No clot was seen in the left atrial appendage.  There is severe tricuspid regurgitation.  Diagnostic cardiac catheterization is notable for the absence of significant coronary artery disease.  I agree the patient needs mitral valve repair.  Risks associated with conventional surgery would be somewhat elevated because of the patient's advanced age and previous aortic valve replacement.  However, left ventricular systolic function remains preserved, the patient appears to be quite vigorous and active for his age, and he might also benefit from concomitant Maze procedure.   Plan:  The patient and his wife were counseled at length regarding the indications, risks and potential benefits of mitral valve repair.  The rationale for elective surgery has been explained, including a comparison between surgery and continued medical therapy with close follow-up.  The likelihood of successful and durable valve repair has been discussed with particular reference to the findings of their recent echocardiogram.  Based upon these findings and previous experience, I have quoted them a greater than 95 percent likelihood of successful valve  repair.  The natural history of primary  mitral regurgitation was discussed including long-term prognosis without some type of surgical intervention.  The relative risks and benefits of performing a maze procedure at the time of his surgery was discussed at length, including the expected likelihood of long term freedom from recurrent symptomatic atrial fibrillation and/or atrial flutter.  Overall risks of surgery and expectations for his postoperative convalescence were discussed. Alternative surgical approaches have been discussed including a comparison between conventional sternotomy and minimally-invasive techniques.   Concerns regarding the patient's age and previous aortic valve replacement were discussed.   As a next step the patient will undergo CT angiography to further evaluate the feasibility of peripheral cannulation for surgery, the possible presence or absence of significant calcification, fibrosis, or other changes in the thoracic aorta potentially related to the patient's previous surgery which might affect surgical risks.  The patient will contemplate treatment options and return to our office in approximately 2 weeks to discuss matters further.  All of his questions have been addressed.   I spent in excess of 90 minutes during the conduct of this office consultation and >50% of this time involved direct face-to-face encounter with the patient for counseling and/or coordination of their care.    [name]. Roxy Manns, MD 10/15/2017 11:42 AM

## 2017-10-15 NOTE — Patient Instructions (Signed)
Continue all previous medications without any changes at this time  

## 2017-10-16 ENCOUNTER — Encounter (HOSPITAL_COMMUNITY): Payer: Self-pay | Admitting: Cardiovascular Disease

## 2017-10-16 ENCOUNTER — Other Ambulatory Visit: Payer: Self-pay

## 2017-10-16 DIAGNOSIS — I340 Nonrheumatic mitral (valve) insufficiency: Secondary | ICD-10-CM

## 2017-10-21 ENCOUNTER — Ambulatory Visit (HOSPITAL_COMMUNITY)
Admission: RE | Admit: 2017-10-21 | Discharge: 2017-10-21 | Disposition: A | Discharge status: Home / Self Care | Payer: Medicare Other | Source: Ambulatory Visit | Attending: Thoracic Surgery (Cardiothoracic Vascular Surgery) | Admitting: Thoracic Surgery (Cardiothoracic Vascular Surgery)

## 2017-10-21 ENCOUNTER — Ambulatory Visit (HOSPITAL_COMMUNITY): Admission: RE | Admit: 2017-10-21 | Payer: Medicare Other | Source: Ambulatory Visit

## 2017-10-21 DIAGNOSIS — N200 Calculus of kidney: Secondary | ICD-10-CM | POA: Insufficient documentation

## 2017-10-21 DIAGNOSIS — Z952 Presence of prosthetic heart valve: Secondary | ICD-10-CM | POA: Insufficient documentation

## 2017-10-21 DIAGNOSIS — I700 Atherosclerosis of aorta: Secondary | ICD-10-CM | POA: Insufficient documentation

## 2017-10-21 DIAGNOSIS — I340 Nonrheumatic mitral (valve) insufficiency: Secondary | ICD-10-CM

## 2017-10-21 MED ORDER — IOPAMIDOL (ISOVUE-370) INJECTION 76%
INTRAVENOUS | Status: AC
  Filled 2017-10-21: qty 100

## 2017-10-21 MED ORDER — IOPAMIDOL (ISOVUE-370) INJECTION 76%
100.0000 mL | Freq: Once | INTRAVENOUS | Status: AC | PRN
  Administered 2017-10-21: 100 mL via INTRAVENOUS

## 2017-10-23 ENCOUNTER — Other Ambulatory Visit: Payer: Self-pay | Admitting: *Deleted

## 2017-10-23 DIAGNOSIS — M519 Unspecified thoracic, thoracolumbar and lumbosacral intervertebral disc disorder: Secondary | ICD-10-CM

## 2017-10-28 ENCOUNTER — Ambulatory Visit: Payer: Medicare Other | Admitting: Thoracic Surgery (Cardiothoracic Vascular Surgery)

## 2017-10-28 ENCOUNTER — Encounter: Payer: Self-pay | Admitting: Thoracic Surgery (Cardiothoracic Vascular Surgery)

## 2017-10-28 ENCOUNTER — Telehealth: Payer: Self-pay | Admitting: *Deleted

## 2017-10-28 ENCOUNTER — Ambulatory Visit (HOSPITAL_COMMUNITY)
Admission: RE | Admit: 2017-10-28 | Discharge: 2017-10-28 | Disposition: A | Discharge status: Home / Self Care | Payer: Medicare Other | Source: Ambulatory Visit | Attending: Thoracic Surgery (Cardiothoracic Vascular Surgery) | Admitting: Thoracic Surgery (Cardiothoracic Vascular Surgery)

## 2017-10-28 ENCOUNTER — Encounter: Payer: Medicare Other | Admitting: Thoracic Surgery (Cardiothoracic Vascular Surgery)

## 2017-10-28 VITALS — BP 131/80 | HR 76 | Resp 20 | Ht 71.0 in | Wt 164.0 lb

## 2017-10-28 DIAGNOSIS — I071 Rheumatic tricuspid insufficiency: Secondary | ICD-10-CM | POA: Insufficient documentation

## 2017-10-28 DIAGNOSIS — I361 Nonrheumatic tricuspid (valve) insufficiency: Secondary | ICD-10-CM | POA: Diagnosis not present

## 2017-10-28 DIAGNOSIS — I480 Paroxysmal atrial fibrillation: Secondary | ICD-10-CM | POA: Diagnosis not present

## 2017-10-28 DIAGNOSIS — M5186 Other intervertebral disc disorders, lumbar region: Secondary | ICD-10-CM | POA: Diagnosis not present

## 2017-10-28 DIAGNOSIS — M519 Unspecified thoracic, thoracolumbar and lumbosacral intervertebral disc disorder: Secondary | ICD-10-CM

## 2017-10-28 DIAGNOSIS — Z952 Presence of prosthetic heart valve: Secondary | ICD-10-CM

## 2017-10-28 DIAGNOSIS — I340 Nonrheumatic mitral (valve) insufficiency: Secondary | ICD-10-CM

## 2017-10-28 MED ORDER — GADOBUTROL 1 MMOL/ML IV SOLN
7.5000 mL | Freq: Once | INTRAVENOUS | Status: AC | PRN
  Administered 2017-10-28: 7 mL via INTRAVENOUS

## 2017-10-28 NOTE — Telephone Encounter (Signed)
S/w pt's wife per [facility]) is aware that appt has been changed due to pt having surgery the beginning of January. Pt's appt was moved to Feb per Truitt Merle, NP.

## 2017-10-28 NOTE — Progress Notes (Signed)
[address]       ,[place] [address]             [phone]     CARDIOTHORACIC SURGERY OFFICE NOTE  Referring Provider is [name], MD PCP is Leighton Ruff, MD   HPI:  Patient is an 82 year old male status post aortic valve replacement using a bioprosthetic tissue valve in 2012, hypertension, atrial fibrillation on long-term anticoagulation, and previous embolic stroke in 1791 who returns to the office today for follow-up of mitral valve prolapse with severe symptomatic primary mitral regurgitation.  He was originally seen in consultation on October 15, 2017.  Since then he underwent CT angiography which revealed normal-appearing aorta.  The radiologist commented on the presence of an abnormality in L1 vertebrae which led to an MRI for follow-up.  Patient returns the office today to discuss these results and to discuss treatment options further.    Current Outpatient Medications  Medication Sig Dispense Refill  . allopurinol (ZYLOPRIM) 300 MG tablet Take 150 mg by mouth daily.    Marland Kitchen amLODipine (NORVASC) 10 MG tablet Take 1 tablet (10 mg total) daily by mouth. 90 tablet 3  . apixaban (ELIQUIS) 5 MG TABS tablet Take 1 tablet (5 mg total) by mouth 2 (two) times daily. 180 tablet 3  . atenolol (TENORMIN) 25 MG tablet Take 0.5 tablets (12.5 mg total) by mouth daily. 45 tablet 3  . b complex vitamins tablet Take 1 tablet by mouth daily.    . finasteride (PROSCAR) 5 MG tablet Take 1 tablet (5 mg total) by mouth daily. 30 tablet 0  . protein supplement shake (PREMIER PROTEIN) LIQD Take 11 oz by mouth daily.    . tamsulosin (FLOMAX) 0.4 MG CAPS capsule Take 2 capsules (0.8 mg total) by mouth daily after breakfast. 30 capsule 0   No current facility-administered medications for this visit.       Physical Exam:   BP 131/80   Pulse 76   Resp 20   Ht 5\' 11"  (1.803 m)   Wt 164 lb (74.4 kg)   SpO2 95% Comment: RA  BMI 22.87 kg/m    General:  Well-appearing  Chest:   Clear to auscultation  CV:   Regular rate and rhythm with systolic murmur  Incisions:  n/a  Abdomen:  Soft nontender  Extremities:  Warm and well-perfused  Diagnostic Tests:  CT ANGIOGRAPHY CHEST, ABDOMEN AND PELVIS  TECHNIQUE: Multidetector CT imaging through the chest, abdomen and pelvis was performed using the standard protocol during bolus administration of intravenous contrast. Multiplanar reconstructed images and MIPs were obtained and reviewed to evaluate the vascular anatomy.  CONTRAST:  138mL ISOVUE-370 IOPAMIDOL (ISOVUE-370) INJECTION 76%  COMPARISON:  05/08/2017, 05/11/2016, 09/18/2009  FINDINGS: CTA CHEST FINDINGS  Cardiovascular:  Heart:  No pericardial fluid/thickening. Calcifications of left main, left anterior descending, right coronary arteries. Surgical changes of mini thoracotomy and aortic valve repair.  Aorta:  Unremarkable course, caliber, contour of the thoracic aorta. No aneurysm or dissection flap. No periaortic fluid. Branch vessels patent without significant atherosclerotic changes. No significant atherosclerotic changes of the descending thoracic aorta  Pulmonary arteries:  No central, lobar, segmental, or proximal subsegmental filling defects.  Mediastinum/Nodes: No mediastinal adenopathy. Unremarkable appearance of the thoracic esophagus.  Unremarkable appearance of the thoracic inlet and thyroid.  Lungs/Pleura: Central airways are clear. No pleural effusion. No confluent airspace disease.  No pneumothorax. Minimal paraseptal emphysema at the right lung apex.  Musculoskeletal: No acute displaced  fracture. Degenerative changes of the spine.  CTA ABDOMEN AND PELVIS FINDINGS  VASCULAR  Aorta: Minimal atherosclerosis of the abdominal aorta. No aneurysm. No periaortic fluid. No dissection.  Celiac: Minimal atherosclerotic changes at the celiac artery origin.  SMA: SMA  is patent with minimal atherosclerotic changes at the origin.  Renals: Minimal atherosclerotic changes at the origin of the bilateral renal arteries which remain patent.  IMA: IMA patent.  Right lower extremity:  Right common iliac artery measures as large as 16 mm. No dissection. Minimal atherosclerotic changes. Hypogastric arteries patent. Anterior and posterior division patent. Common femoral artery patent. Proximal SFA and profunda femoris patent. Surgical changes overlying the right common femoral artery/proximal SFA.  Left lower extremity:  Left common iliac artery measures as large as 16 mm. No dissection. Minimal atherosclerotic changes. Hypogastric arteries patent. Anterior and posterior division patent. Common femoral artery patent. Proximal SFA and profunda femoris patent.  Veins: Unremarkable appearance of the venous system.  Review of the MIP images confirms the above findings.  NON-VASCULAR  Hepatobiliary: Unremarkable appearance of the liver. Cholecystectomy  Pancreas: Unremarkable pancreas  Spleen: Unremarkable.  Adrenals/Urinary Tract: Unremarkable appearance of adrenal glands.  Right:  No evidence of hydronephrosis. Cystic lesion of the inferior right kidney cortex, compatible with Bosniak 1 cyst. Low-density lesion at the superior cortex of the right kidney, most likely benign cyst. Redemonstration of nonobstructive stones of the right collecting system. Unremarkable course the right ureter.  Left:  No left-sided hydronephrosis. No inflammatory changes. Unremarkable course of the left ureter.  Unremarkable appearance of the urinary bladder.  There are small bilateral low-density lesions which are too small to characterize.  Stomach/Bowel: Unremarkable appearance of the stomach. Unremarkable appearance of small bowel. No evidence of obstruction. Normal appendix. Colonic diverticular disease without evidence of  acute diverticulitis.  Lymphatic: No lymphadenopathy.  Mesenteric: No free fluid or air. No adenopathy.  Reproductive: Transverse diameter of the prostate measures 5.4 cm.  Other: No hernia.  Musculoskeletal: No acute fracture. There is lucent lesion of the L1 vertebral body, which replaces the expected trabecula. This is clearly new when compared to the CT of 09/18/2009, though does not appear to have changed since the prior CT of 05/08/2017. Questionable inferior plate erosion and involvement of the superior endplate of L2. No vertebral body height loss or fracture line.  Multilevel degenerative changes of the thoracic and lumbar spine. No bony canal narrowing. Degenerative changes of the hips.  Review of the MIP images confirms the above findings.  IMPRESSION: Surgical changes of prior mini thoracotomy and aortic valve replacement.  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  There is lucent lesion of the L1 vertebral body, with replacement of the vertebral body trabecula and erosion of the inferior L1 endplate, new from the CT of 09/18/2009. Further evaluation with contrast-enhanced lumbar spine MRI is recommended, as metastatic disease cannot be excluded.  Nonobstructive right-sided nephrolithiasis.  Signed,  Dulcy Fanny. Dellia Nims, RPVI  [facility]  [facility]   Electronically Signed   By: Corrie Mckusick D.O.   On: 10/21/2017 21:04   MRI LUMBAR SPINE WITHOUT AND WITH CONTRAST  TECHNIQUE: Multiplanar and multiecho pulse sequences of the lumbar spine were obtained without and with intravenous contrast.  CONTRAST:  7 cc Gadavist  COMPARISON:  10/21/2017, 05/08/2017, 05/11/2016 and 09/18/2009 CT  FINDINGS: Segmentation: Last fully open disc space L5-S1. Current examination incorporates from  T11-12  disc space through the upper S2 level.  Alignment:  Mild curvature lumbar spine convex left.  Vertebrae: Fusion L1-2 disc space. No abnormality noted involving the L1 vertebral body as questioned on recent CT. 9 mm nonspecific rounded lesion left L3 vertebral body pedicle junction. No other bony abnormality noted.  Conus medullaris and cauda equina: Conus extends to the lower T12 level. Conus and cauda equina appear normal.  Paraspinal and other soft tissues: Partially visualize right renal cystic structures better delineated on CT. Atherosclerotic changes aorta.  Disc levels:  T11-12: Small Schmorl's node deformity. No spinal stenosis or foraminal narrowing.  T12-L1: No spinal stenosis or foraminal narrowing.  L1-2: Fusion.  No spinal stenosis or foraminal narrowing.  L2-3: Disc degeneration. Small Schmorl's node deformity superior endplate L3. Anterior osteophyte. Minimal bulge. No significant spinal stenosis or foraminal narrowing.  L3-4: Small Schmorl's node deformity. Minimal bulge. Mild facet degenerative changes. No significant spinal stenosis or foraminal narrowing.  L4-5: Facet bony overgrowth slightly greater on left. Minimal indentation left lateral thecal sac. Minimal bulge. No significant spinal stenosis or foraminal narrowing.  L5-S1: Facet degenerative change greater on the right. No significant spinal stenosis or foraminal narrowing.  IMPRESSION: 1. No abnormality involving the L1 vertebral body as questioned on recent CT. Fusion of the L1-2 disc space contributes to the slightly indistinct appearance of the endplates as noted on recent CT. 2. 9 mm nonspecific rounded lesion left L3 vertebral body-pedicle junction (series 8, image 12 and series 5, image 22). No other bony abnormality noted. Etiology of this L3 bony lesion is indeterminate. If the patient had a known malignancy, bone scan at the present time may than be considered to  determine if there are any other osseous abnormalities. If the patient does not have a known malignancy, than stability of this finding can be confirmed on follow-up MR in 6 months. 3. Scattered degenerative changes without significant spinal stenosis or foraminal narrowing as detailed above.   Electronically Signed   By: Genia Del M.D.   On: 10/28/2017 08:57   Impression:  Patient has mitral valve prolapse with stage D severe symptomatic primary mitral regurgitation and persistent atrial fibrillation dating back at least one year at which time the patient presented with an acute embolic stroke.  He currently describes stable symptoms of mild exertional shortness of breath and fatigue consistent with chronic diastolic congestive heart failure, New York Heart Association functional class I-II.  I have personally reviewed the patient's recent transthoracic and transesophageal echocardiograms as well as his diagnostic cardiac catheterization.  Echocardiograms demonstrate normal left ventricular systolic function with normal bioprosthetic tissue valve in aortic position.  There is mitral valve prolapse with an obvious flail segment involving the middle scallop of the posterior leaflet of the mitral valve.  The mitral annulus is dilated.  There is type II mitral valve dysfunction with severe mitral regurgitation.  There is severe left atrial enlargement.  No clot was seen in the left atrial appendage.  There is severe tricuspid regurgitation.  Diagnostic cardiac catheterization is notable for the absence of significant coronary artery disease.  CT angiography reveals a normal-appearing thoracic aorta and no contraindications to peripheral cannulation for surgery.  Plan:  I have again reviewed the indications, risk, and potential benefits of mitral valve repair and Maze procedure with the patient and his wife.  We discussed alternative surgical approaches.  Because his aortic valve replacement  was performed via right anterior minithoracotomy approach, there is a significant likelihood that there would  be adhesions between the right lung and the chest wall and or the structures of the mediastinum which would make it difficult if not impossible to approach his mitral valve repair via right minithoracotomy approach.  We discussed the possibility of attempting minithoracotomy with the understanding there would be a high likelihood of conversion to conventional sternotomy.  As an alternative, conventional sternotomy would be more straightforward.  All the questions have been addressed.  The patient is interested in proceeding with surgery but desires to delay until after the first of the year for personal reasons.  Given his clinical stability this seems reasonable.  The patient will return to our office in approximately 2 months at which time we will make final plans and schedule his surgery.  I spent in excess of 15 minutes during the conduct of this office consultation and >50% of this time involved direct face-to-face encounter with the patient for counseling and/or coordination of their care.    [name]. Roxy Manns, MD 10/28/2017 11:51 AM

## 2017-10-28 NOTE — Patient Instructions (Addendum)
Continue all previous medications without any changes at this time  

## 2017-12-04 ENCOUNTER — Other Ambulatory Visit: Payer: Self-pay | Admitting: Nurse Practitioner

## 2017-12-04 DIAGNOSIS — I4819 Other persistent atrial fibrillation: Secondary | ICD-10-CM

## 2017-12-04 DIAGNOSIS — Z952 Presence of prosthetic heart valve: Secondary | ICD-10-CM

## 2017-12-04 DIAGNOSIS — Z79899 Other long term (current) drug therapy: Secondary | ICD-10-CM

## 2017-12-16 ENCOUNTER — Ambulatory Visit: Payer: Medicare Other | Admitting: Thoracic Surgery (Cardiothoracic Vascular Surgery)

## 2017-12-16 ENCOUNTER — Other Ambulatory Visit: Payer: Self-pay | Admitting: *Deleted

## 2017-12-16 ENCOUNTER — Encounter: Payer: Self-pay | Admitting: Thoracic Surgery (Cardiothoracic Vascular Surgery)

## 2017-12-16 ENCOUNTER — Other Ambulatory Visit: Payer: Self-pay

## 2017-12-16 VITALS — BP 118/70 | HR 64 | Resp 16 | Ht 71.0 in | Wt 163.0 lb

## 2017-12-16 DIAGNOSIS — Z952 Presence of prosthetic heart valve: Secondary | ICD-10-CM | POA: Diagnosis not present

## 2017-12-16 DIAGNOSIS — I480 Paroxysmal atrial fibrillation: Secondary | ICD-10-CM

## 2017-12-16 DIAGNOSIS — I340 Nonrheumatic mitral (valve) insufficiency: Secondary | ICD-10-CM | POA: Diagnosis not present

## 2017-12-16 DIAGNOSIS — I361 Nonrheumatic tricuspid (valve) insufficiency: Secondary | ICD-10-CM | POA: Diagnosis not present

## 2017-12-16 NOTE — Progress Notes (Signed)
[address]       [place] [address]             [phone]     CARDIOTHORACIC SURGERY OFFICE NOTE  Referring Provider is [name], MD PCP is Leighton Ruff, MD   HPI:  Patient is an 82 year old male status post aortic valve replacement using abioprosthetictissue valve in 2012,hypertension, atrial fibrillation on long-term anticoagulation, and previous embolic stroke in 4580 who returns to the office today for follow-up of mitral valve prolapse with severe symptomatic primary mitral regurgitation.  He was originally seen in consultation on October 15, 2017.    He was last seen here in the office on October 28, 2017 at which time we discussed plans for elective mitral valve repair and Maze procedure.  For a variety of personal reasons the patient decided he wanted to wait until after the first of the year.  He returns to our office today with hopes to schedule surgery in January.  He reports no new problems or complaints.   Current Outpatient Medications  Medication Sig Dispense Refill  . allopurinol (ZYLOPRIM) 300 MG tablet Take 150 mg by mouth daily.    Marland Kitchen amLODipine (NORVASC) 10 MG tablet TAKE 1 TABLET BY MOUTH  DAILY 90 tablet 3  . atenolol (TENORMIN) 25 MG tablet Take 0.5 tablets (12.5 mg total) by mouth daily. 45 tablet 3  . b complex vitamins tablet Take 1 tablet by mouth daily.    Marland Kitchen ELIQUIS 5 MG TABS tablet TAKE 1 TABLET BY MOUTH TWO  TIMES DAILY 180 tablet 3  . finasteride (PROSCAR) 5 MG tablet Take 1 tablet (5 mg total) by mouth daily. 30 tablet 0  . protein supplement shake (PREMIER PROTEIN) LIQD Take 11 oz by mouth daily.    . tamsulosin (FLOMAX) 0.4 MG CAPS capsule Take 2 capsules (0.8 mg total) by mouth daily after breakfast. 30 capsule 0   No current facility-administered medications for this visit.       Physical Exam:   BP 118/70 (BP Location: Right Arm, Patient Position: Sitting, Cuff Size: Large)   Pulse 64   Resp 16   Ht 5'  11" (1.803 m)   Wt 163 lb (73.9 kg)   SpO2 96% Comment: ON RA  BMI 22.73 kg/m   General:  Well-appearing  Chest:   Clear to auscultation  CV:   Regular rate and rhythm with prominent holosystolic murmur  Incisions:  n/a  Abdomen:  Soft nontender  Extremities:  Warm and well-perfused  Diagnostic Tests:  n/a   Impression:  Patient has mitral valve prolapse with stage D severe symptomatic primary mitral regurgitation and persistent atrial fibrillation dating back at least one year at which time the patient presented with an acute embolic stroke. He currently describes stable symptoms of mild exertional shortness of breath and fatigue consistent with chronic diastolic congestive heart failure, New York Heart Association functional class I-II. I have personally reviewed the patient's recent transthoracic and transesophageal echocardiograms as well as his diagnostic cardiac catheterization. Echocardiograms demonstrate normal left ventricular systolic function with normal bioprosthetic tissue valve in aortic position. There is mitral valve prolapse with an obvious flail segment involving the middle scallop of the posterior leaflet of the mitral valve. The mitral annulus is dilated. There is type II mitral valve dysfunction with severe mitral regurgitation. There is severe left atrial enlargement. No clot was seen in the left atrial appendage. There is severe tricuspid regurgitation. Diagnostic cardiac catheterization  is notable for the absence of significant coronary artery disease.     Plan:  I have again reviewed the indications, risk, and potential benefits of mitral valve repair and Maze procedure with the patient and his wife in the office today.  They desire to proceed with surgery in January.  We tentatively have made plans for surgery on January 23, 2018.  The patient has been instructed to stop taking Eliquis 7 days prior to surgery.  He will return to our office for follow-up  prior to surgery on January 20, 2018.  I spent in excess of 15 minutes during the conduct of this office consultation and >50% of this time involved direct face-to-face encounter with the patient for counseling and/or coordination of their care.    [name]. Roxy Manns, MD 12/16/2017 1:00 PM

## 2017-12-16 NOTE — Patient Instructions (Addendum)
Stop taking Eliquis 7 days prior to surgery  Continue taking all other medications without change through the day before surgery.  Have nothing to eat or drink after midnight the night before surgery.  On the morning of surgery take only atenolol (Tenormin) with a sip of water.

## 2017-12-17 ENCOUNTER — Encounter: Payer: Self-pay | Admitting: *Deleted

## 2017-12-17 ENCOUNTER — Other Ambulatory Visit: Payer: Self-pay | Admitting: *Deleted

## 2017-12-18 ENCOUNTER — Ambulatory Visit: Payer: Medicare Other | Admitting: Nurse Practitioner

## 2018-01-14 ENCOUNTER — Encounter: Payer: Self-pay | Admitting: *Deleted

## 2018-01-14 ENCOUNTER — Other Ambulatory Visit: Payer: Self-pay | Admitting: *Deleted

## 2018-01-14 ENCOUNTER — Telehealth: Payer: Self-pay | Admitting: *Deleted

## 2018-01-14 DIAGNOSIS — L309 Dermatitis, unspecified: Secondary | ICD-10-CM

## 2018-01-14 NOTE — Telephone Encounter (Signed)
[facility] Research study: Spoke with patient in detail about research protocol and requirements. Informed Consent Form sent via email for patient and wife to review. Questions encouraged and answer. Patient states he "would probably participate". Research office will follow up either via phone or in person on 20-Jan-2018 during preop testing. Instructed patient to call research office for questions.

## 2018-01-17 NOTE — Pre-Procedure Instructions (Signed)
[place]  01/17/2018      [place], [place] [address] [place] [place] [address] Phone: [phone] Fax: [place], [facility] [address] [place] [address] [place] [address] Phone: [phone] Fax: [phone]    Your procedure is scheduled on Thursday, Jan. 23rd    Report to [facility] Admitting at 5:30 AM             (posted surgery time 7:30a - 2:17p)   Call this number if you have problems the morning of surgery:  [phone]   Remember:  Do not eat any foods or drink any liquids after midnight, Wednesday.              7 days prior to surgery, STOP TAKING any Vitamins, Herbal Supplements, Anti-inflammatories, Blood Thinners.              Eliquis, per Dr. Ricard Dillon' instructions to be stopped 7 days prior to surgery  Last dose (Jan 16th)__   Take these medicines the morning of surgery with A SIP OF WATER : Amlodipine, Atenolol, Flomax    Do not wear jewelry - NO rings or watches.  Do not wear lotions, colognes or deodorant.             Men may shave face and neck.  Do not bring valuables to the hospital.  [facility] is not responsible for any belongings or valuables.  Contacts, dentures or bridgework may not be worn into surgery.  Leave your suitcase in the car.  After surgery it may be brought to your room.  For patients admitted to the hospital, discharge time will be determined by your treatment team.  Please read over the following fact sheets that you were given. Pain Booklet, MRSA Information and Surgical Site Infection Prevention       [place]- Preparing For Surgery  Before surgery, you can play an important role. Because skin is not sterile, your skin needs to be as free of germs as possible. You can reduce the number of germs on your skin by washing with CHG (chlorahexidine gluconate) Soap before surgery.   CHG is an antiseptic cleaner which kills germs and bonds with the skin to continue killing germs even after washing.    Oral Hygiene is also important to reduce your risk of infection.    Remember - BRUSH YOUR TEETH THE MORNING OF SURGERY WITH YOUR REGULAR TOOTHPASTE  Please do not use if you have an allergy to CHG or antibacterial soaps. If your skin becomes reddened/irritated stop using the CHG.  Do not shave (including legs and underarms) for at least 48 hours prior to first CHG shower. It is OK to shave your face.  Please follow these instructions carefully.   1. Shower the NIGHT BEFORE SURGERY and the MORNING OF SURGERY with CHG.   2. If you chose to wash your hair, wash your hair first as usual with your normal shampoo.  3. After you shampoo, rinse your hair and body thoroughly to remove the shampoo.  4. Use CHG as you would any other liquid soap. You can apply CHG directly to the skin and wash gently with a scrungie or a clean washcloth.   5. Apply the CHG Soap to your body ONLY FROM THE NECK DOWN.  Do not use on open wounds or open sores. Avoid contact with your eyes,  ears, mouth and genitals (private parts). Wash Face and genitals (private parts)  with your normal soap.  6. Wash thoroughly, paying special attention to the area where your surgery will be performed.  7. Thoroughly rinse your body with warm water from the neck down.  8. DO NOT shower/wash with your normal soap after using and rinsing off the CHG Soap.  9. Pat yourself dry with a CLEAN TOWEL.  10. Wear CLEAN PAJAMAS to bed the night before surgery, wear comfortable clothes the morning of surgery  11. Place CLEAN SHEETS on your bed the night of your first shower and DO NOT SLEEP WITH PETS.  Day of Surgery:  Do not apply any deodorants/lotions.  Please wear clean clothes to the hospital/surgery center.    Remember to brush your teeth WITH YOUR REGULAR TOOTHPASTE.

## 2018-01-20 ENCOUNTER — Encounter: Payer: Self-pay | Admitting: Thoracic Surgery (Cardiothoracic Vascular Surgery)

## 2018-01-20 ENCOUNTER — Encounter (HOSPITAL_COMMUNITY)
Admission: RE | Admit: 2018-01-20 | Discharge: 2018-01-20 | Disposition: A | Discharge status: Home / Self Care | Payer: Medicare Other | Source: Ambulatory Visit | Attending: Thoracic Surgery (Cardiothoracic Vascular Surgery) | Admitting: Thoracic Surgery (Cardiothoracic Vascular Surgery)

## 2018-01-20 ENCOUNTER — Ambulatory Visit (HOSPITAL_COMMUNITY)
Admission: RE | Admit: 2018-01-20 | Discharge: 2018-01-20 | Disposition: A | Discharge status: Home / Self Care | Payer: Medicare Other | Source: Ambulatory Visit | Attending: Thoracic Surgery (Cardiothoracic Vascular Surgery) | Admitting: Thoracic Surgery (Cardiothoracic Vascular Surgery)

## 2018-01-20 ENCOUNTER — Other Ambulatory Visit: Payer: Self-pay | Admitting: *Deleted

## 2018-01-20 ENCOUNTER — Ambulatory Visit: Payer: Medicare Other | Admitting: Thoracic Surgery (Cardiothoracic Vascular Surgery)

## 2018-01-20 ENCOUNTER — Other Ambulatory Visit: Payer: Self-pay

## 2018-01-20 ENCOUNTER — Encounter (HOSPITAL_COMMUNITY): Payer: Self-pay

## 2018-01-20 VITALS — BP 112/71 | HR 72 | Resp 16 | Ht 72.0 in | Wt 161.0 lb

## 2018-01-20 DIAGNOSIS — I480 Paroxysmal atrial fibrillation: Secondary | ICD-10-CM

## 2018-01-20 DIAGNOSIS — I340 Nonrheumatic mitral (valve) insufficiency: Secondary | ICD-10-CM | POA: Insufficient documentation

## 2018-01-20 DIAGNOSIS — Z006 Encounter for examination for normal comparison and control in clinical research program: Secondary | ICD-10-CM

## 2018-01-20 HISTORY — DX: Cardiac arrhythmia, unspecified: I49.9

## 2018-01-20 HISTORY — DX: Cardiac murmur, unspecified: R01.1

## 2018-01-20 LAB — PULMONARY FUNCTION TEST
DL/VA % pred: 69 %
DL/VA: 3.24 ml/min/mmHg/L
DLCO cor % pred: 55 %
DLCO cor: 19.45 ml/min/mmHg
DLCO unc % pred: 54 %
DLCO unc: 19.06 ml/min/mmHg
FEF 25-75 Post: 3.53 L/sec
FEF 25-75 Pre: 2.79 L/sec
FEF2575-%Change-Post: 26 %
FEF2575-%Pred-Post: 163 %
FEF2575-%Pred-Pre: 128 %
FEV1-%Change-Post: 8 %
FEV1-%Pred-Post: 101 %
FEV1-%Pred-Pre: 93 %
FEV1-Post: 3.18 L
FEV1-Pre: 2.93 L
FEV1FVC-%Change-Post: 1 %
FEV1FVC-%Pred-Pre: 109 %
FEV6-%Change-Post: 6 %
FEV6-%Pred-Post: 96 %
FEV6-%Pred-Pre: 90 %
FEV6-Post: 3.95 L
FEV6-Pre: 3.72 L
FEV6FVC-%Change-Post: 0 %
FEV6FVC-%Pred-Post: 106 %
FEV6FVC-%Pred-Pre: 106 %
FVC-%Change-Post: 6 %
FVC-%Pred-Post: 90 %
FVC-%Pred-Pre: 85 %
FVC-Post: 3.97 L
FVC-Pre: 3.73 L
Post FEV1/FVC ratio: 80 %
Post FEV6/FVC ratio: 100 %
Pre FEV1/FVC ratio: 79 %
Pre FEV6/FVC Ratio: 100 %
RV % pred: 73 %
RV: 2.04 L
TLC % pred: 80 %
TLC: 5.99 L

## 2018-01-20 LAB — COMPREHENSIVE METABOLIC PANEL
ALT: 23 U/L (ref 0–44)
AST: 25 U/L (ref 15–41)
Albumin: 4 g/dL (ref 3.5–5.0)
Alkaline Phosphatase: 74 U/L (ref 38–126)
Anion gap: 10 (ref 5–15)
BUN: 15 mg/dL (ref 8–23)
CO2: 18 mmol/L — ABNORMAL LOW (ref 22–32)
Calcium: 9.1 mg/dL (ref 8.9–10.3)
Chloride: 111 mmol/L (ref 98–111)
Creatinine, Ser: 0.8 mg/dL (ref 0.61–1.24)
GFR calc Af Amer: >60 mL/min (ref >60)
GFR calc non Af Amer: >60 mL/min (ref >60)
Glucose, Bld: 105 mg/dL — ABNORMAL HIGH (ref 70–99)
Potassium: 4 mmol/L (ref 3.5–5.1)
Sodium: 139 mmol/L (ref 135–145)
Total Bilirubin: 0.9 mg/dL (ref 0.3–1.2)
Total Protein: 7.5 g/dL (ref 6.5–8.1)

## 2018-01-20 LAB — CBC
HCT: 42.9 % (ref 39.0–52.0)
Hemoglobin: 13.9 g/dL (ref 13.0–17.0)
MCH: 32 pg (ref 26.0–34.0)
MCHC: 32.4 g/dL (ref 30.0–36.0)
MCV: 98.6 fL (ref 80.0–100.0)
Platelets: 124 10*3/uL — ABNORMAL LOW (ref 150–400)
RBC: 4.35 MIL/uL (ref 4.22–5.81)
RDW: 13 % (ref 11.5–15.5)
WBC: 5.5 10*3/uL (ref 4.0–10.5)
nRBC: 0 % (ref 0.0–0.2)

## 2018-01-20 LAB — BLOOD GAS, ARTERIAL
Acid-base deficit: 1.9 mmol/L (ref 0.0–2.0)
Bicarbonate: 21.7 mmol/L (ref 20.0–28.0)
Drawn by: 53547
FIO2: 0.21
O2 Saturation: 97.1 %
Patient temperature: 98.6
pCO2 arterial: 33.3 mmHg (ref 32.0–48.0)
pH, Arterial: 7.43 (ref 7.350–7.450)
pO2, Arterial: 95.1 mmHg (ref 83.0–108.0)

## 2018-01-20 LAB — URINALYSIS, ROUTINE W REFLEX MICROSCOPIC
Bilirubin Urine: NEGATIVE
Glucose, UA: NEGATIVE mg/dL
Ketones, ur: NEGATIVE mg/dL
Nitrite: POSITIVE — AB
Protein, ur: NEGATIVE mg/dL
Specific Gravity, Urine: 1.014 (ref 1.005–1.030)
pH: 5 (ref 5.0–8.0)

## 2018-01-20 LAB — PROTIME-INR
INR: 1.31
Prothrombin Time: 16.2 seconds — ABNORMAL HIGH (ref 11.4–15.2)

## 2018-01-20 LAB — SURGICAL PCR SCREEN
MRSA, PCR: NEGATIVE
Staphylococcus aureus: POSITIVE — AB

## 2018-01-20 LAB — HEMOGLOBIN A1C
Hgb A1c MFr Bld: 5.9 % — ABNORMAL HIGH (ref 4.8–5.6)
Mean Plasma Glucose: 122.63 mg/dL

## 2018-01-20 LAB — APTT: aPTT: 42 seconds — ABNORMAL HIGH (ref 24–36)

## 2018-01-20 MED ORDER — ALBUTEROL SULFATE (2.5 MG/3ML) 0.083% IN NEBU
2.5000 mg | INHALATION_SOLUTION | Freq: Once | RESPIRATORY_TRACT | Status: AC
  Administered 2018-01-20: 2.5 mg via RESPIRATORY_TRACT

## 2018-01-20 NOTE — Progress Notes (Signed)
PCP - Dr. Drema [place] Cardiologist - Truitt Merle, NP  Chest x-ray - 01/20/2018 EKG - 01/20/2018 Stress Test - 2018 ECHO - 2019 Cardiac Cath - 10/2017  Sleep Study - patient denies CPAP -   Fasting Blood Sugar - n/a Checks Blood Sugar _____ times a day  Blood Thinner Instructions: per Dr. Roxy Manns, stop Eliquis 7 days prior to Surgery Aspirin Instructions: n/a  Anesthesia review: yes, cardiac history  Patient denies shortness of breath, fever, cough and chest pain at PAT appointment   Patient verbalized understanding of instructions that were given to them at the PAT appointment. Patient was also instructed that they will need to review over the PAT instructions again at home before surgery.

## 2018-01-20 NOTE — Progress Notes (Signed)
[name]       [place] [address]             [facility] OFFICE NOTE  Referring Provider is [name], MD PCP is Leighton Ruff, MD   HPI:  Patient is an 83 year old malestatus post aortic valve replacement using abioprosthetictissue valve in 2012,hypertension, atrial fibrillation on long-term anticoagulation, and previous embolic stroke in 0258 whoreturns to the office today for follow-up of mitral valve prolapse with severe symptomatic primary mitral regurgitation.  He was originally seen in consultation on October 15, 2017 and he was last seen here in our office on December 16, 2017 at which time we made plans for definitive surgical intervention.  The patient returns to the office today with plans to proceed with mitral valve repair and Maze procedure on Thursday, January 30, 2018.  He reports no new problems or complaints since his last office visit.   Current Outpatient Medications  Medication Sig Dispense Refill  . allopurinol (ZYLOPRIM) 300 MG tablet Take 150 mg by mouth daily.    Marland Kitchen amLODipine (NORVASC) 10 MG tablet TAKE 1 TABLET BY MOUTH  DAILY (Patient taking differently: Take 10 mg by mouth daily. ) 90 tablet 3  . amoxicillin (AMOXIL) 500 MG capsule Take 2,000 mg by mouth See admin instructions. Take 2000 mg by mouth 1 hour prior to dental appointment    . atenolol (TENORMIN) 25 MG tablet Take 0.5 tablets (12.5 mg total) by mouth daily. 45 tablet 3  . b complex vitamins tablet Take 1 tablet by mouth daily.    . diphenhydrAMINE (BENADRYL) 2 % cream Apply 1 application topically daily as needed for itching.    Marland Kitchen ELIQUIS 5 MG TABS tablet TAKE 1 TABLET BY MOUTH TWO  TIMES DAILY 180 tablet 3  . finasteride (PROSCAR) 5 MG tablet Take 1 tablet (5 mg total) by mouth daily. 30 tablet 0  . tamsulosin (FLOMAX) 0.4 MG CAPS capsule Take 2 capsules (0.8 mg total) by mouth daily after breakfast. 30 capsule 0  . triamcinolone  cream (KENALOG) 0.1 % Apply 1 application topically daily as needed.     No current facility-administered medications for this visit.       Physical Exam:   BP 112/71 (BP Location: Right Arm, Patient Position: Sitting, Cuff Size: Large)   Pulse 72   Resp 16   Ht 6' (1.829 m)   Wt 161 lb (73 kg)   SpO2 96% Comment: RA  BMI 21.84 kg/m   General:  Well-appearing  Chest:   Clear to auscultation with symmetrical breath sounds  CV:   Irregular rate and rhythm with systolic murmur  Incisions:  n/a  Abdomen:  Soft nontender  Extremities:  Warm and well-perfused  Diagnostic Tests:  n/a   Impression:  Patient has mitral valve prolapse with stage D severe symptomatic primary mitral regurgitation and persistent atrial fibrillation dating back at least one year at which time the patient presented with an acute embolic stroke. He currently describes stable symptoms of mild exertional shortness of breath and fatigue consistent with chronic diastolic congestive heart failure, New York Heart Association functional class I-II. I have personally reviewed the patient's recent transthoracic and transesophageal echocardiograms as well as his diagnostic cardiac catheterization. Echocardiograms demonstrate normal left ventricular systolic function with normal bioprosthetic tissue valve in aortic position. There is mitral valve prolapse with an obvious flail segment involving the middle scallop of the posterior leaflet  of the mitral valve. The mitral annulus is dilated. There is type II mitral valve dysfunction with severe mitral regurgitation. There is severe left atrial enlargement. No clot was seen in the left atrial appendage. There is severe tricuspid regurgitation. Diagnostic cardiac catheterization is notable for the absence of significant coronary artery disease.   Plan:  I have again reviewed the indications, risk, and potential benefits of mitral valve repair and Maze procedure  with the patient and his wife in the office today.   The rationale for elective surgery has been explained, including a comparison between surgery and continued medical therapy with close follow-up.  The likelihood of successful and durable valve repair has been discussed with particular reference to the findings of their recent echocardiogram.  Based upon these findings and previous experience, I have quoted them a greater than 90 percent likelihood of successful valve repair.  In the unlikely event that his valve cannot be successfully repaired, we discussed the possibility of replacing the mitral valve using a mechanical prosthesis with the attendant need for long-term anticoagulation versus the alternative of replacing it using a bioprosthetic tissue valve with its potential for late structural valve deterioration and failure, depending upon the patient's longevity.  The patient specifically requests that if the mitral valve must be replaced that it be done using a bioprosthetic tissue valve.   The relative risks and benefits of performing a maze procedure at the time of their surgery was discussed at length, including the expected likelihood of long term freedom from recurrent symptomatic atrial fibrillation and/or atrial flutter.  The patient understands and accepts all potential risks of surgery including but not limited to risk of death, stroke or other neurologic complication, myocardial infarction, congestive heart failure, respiratory failure, renal failure, bleeding requiring transfusion and/or reexploration, arrhythmia, infection or other wound complications, pneumonia, pleural and/or pericardial effusion, pulmonary embolus, aortic dissection or other major vascular complication, or delayed complications related to valve repair or replacement including but not limited to structural valve deterioration and failure, thrombosis, embolization, endocarditis, or paravalvular leak.   The patient additionally  provides consent for long term follow up following surgery including participation in the [place].  All of their questions have been answered.    I spent in excess of 15 minutes during the conduct of this office consultation and >50% of this time involved direct face-to-face encounter with the patient for counseling and/or coordination of their care.   [name]. Roxy Manns, MD 01/20/2018 3:59 PM

## 2018-01-20 NOTE — Progress Notes (Signed)
Pre OP evaluation for Re-Do Sternotomy completed. Preliminary results can be found in chart review CV [place], [place] 01/20/2018, 2:08 PM

## 2018-01-20 NOTE — Research (Signed)
Yes No Inclusion  [x]  []  Institutional Review Board (IRB)/Independent Ethics Committee (IEC)-approved written informed consent and privacy language as per national regulations (e.g., [place] for [place] sites) must be obtained from the subject or legally authorized representative prior to any study-related procedures (including withdrawal of prohibited medication).  [x]  []  Subject agrees not to participate in another interventional study after signing the informed consent form (ICF) and until the end of study (EoS) visit has been completed.   [x]  []  Subject is ? 81 years of age at time of screening (visit   [x]  []  Subject undergoing non-emergent open chest cardiovascular surgery with use of CPB (i.e., CABG and/or valve surgery [including aortic root and ascending aorta surgery, without circulatory arrest]) within 4 weeks of screening (visit 1).  [x]  []  Subject has moderate/high risk of developing AKI following surgery: [x]  Only CABG, or single valve surgery: subject requires to have at least 2 AKI risk factors at screening [] Combined CABG and surgery for 1 or more valves: subject requires to have at least 1 AKI risk factor at screening [] Surgery for more than 1 cardiac valve: subject requires to have at least 1 AKI risk factor at screening [] Surgery for aortic root or ascending aorta without circulatory arrest: subject requires to have at least 1 AKI risk factor at screening  AKI risk factors: [x]  Age at screening of ? 70 years []  Documented history of eGFR < 60 mL/min per 1.73 m2 as per Modification of Diet in Renal Disease Study (MDRD) or CKD-EPI equation (or documented measured GFR assessment) [] o History needs to be present for at least 90 days prior to screening. Both SCr and eGFR need to be documented in the chart, and [] o eGFR at screening or baseline needs to be < 60 mL/min per 1.73 m2, as well as per CKD-EPI equation. []   Documented history of congestive heart failure requiring hospitalization. This condition should exist for at least 90 days prior to screening. []  Documented history of diabetes mellitus (type 1 or 2) of ? 90 days prior to screening, and subject is on active antidiabetic medication treatment for ? 90 days. [x]  Documented history of proteinuria/albuminuria at any time point before screening [] o Urinary dipstick result of ? 2+, OR [] o Documented urinary albumin creatinine ratio measurement of ? 300 mg/g, or [] o Documented total quantity of protein in a 24-hour urine collection test ? 0.3 g/day.  [x]  []  Subject must have the ability, in the opinion of the investigator, and willingness to return for all scheduled visits and perform all assessments.  []  []  A male subject is eligible to participate if she is not pregnant see [Appendix 12.3 Contraception Requirements] and at least 1 of the following conditions applies: []  Not a woman of childbearing potential (WOCBP) as defined in [Appendix 12.3 Contraception Requirements] OR []  WOCBP who agrees to follow the contraceptive guidance as defined in [Appendix 12.3 Contraception Requirements] throughout the treatment period and for at least 30 days after the final study drug administration.  []  []  Male subject must agree not to breastfeed starting at screening and throughout the study period, and for 30 days after the final study drug administration.  []  []  Male subject must not donate ova starting at screening and throughout the study period, and for 30 days after the final study drug administration.  [x]  []  A male subject with male partner(s) of childbearing potential must agree to use contraception as detailed in [Appendix 12.3 Contraception Requirements] during  the treatment period and for at least 30 days after the final study drug administration.  [x]  []  A male subject must not donate sperm during the treatment period and for at least 30 days after  the final study drug administration.  [x]  []  Male subject with a pregnant or breastfeeding partner(s) must agree to remain abstinent or use a condom for the duration of the pregnancy or time partner is breastfeeding throughout the study period and for 30 days after the final study drug administration.    Exclusion  []  [x]  Subject has received investigational drug within 30 days or 5 half-lives, whichever is longer, prior to screening.  []  [x]  Subject has use of RRT within 30 days prior to screening.  []  [x]  Subject has a known history of eGFR < 30 mL/min per 1.73 m2 as per CKD-EPI or MDRD equation at any time before screening.  []  [x]  Subject has a prior kidney transplantation.  []  [x]  Subject has a known or suspected glomerulonephritis (other than Diabetic Kidney Disease).  []  [x]  Subject has confirmed or treated endocarditis, or other current active infection requiring antibiotic treatment, within 30 days prior to screening.  []  [x]  Subject is using prohibited medications as specified in the concomitant medication section of the protocol [Section 5.1.3.1 Prohibited and Restricted Treatment].  []  [x]  Subject has a prior history of intravenous drug abuse within 1 year prior to screening.  []  [x]  Subject has a known chronic liver disorder with Child-Pugh B or C classification.  []  [x]  Subject has any of the following abnormal liver or kidney function parameters (as assessed in visit 1 sample. If 1 of results of central or local laboratory testing fulfill the criterion, the subjects will be eligible.): []  Alanine aminotransferase (ALT), aspartate aminotransferase (AST) to > 2 times the upper limit of normal (ULN) or bilirubin increased to > 1.5 times the ULN. []  eGFR < 30 mL/min per 1.73 m2 as per CKD-EPI equation.  []  [x]  Subject has use of left ventricular assist device (LVAD), intra-aortic balloon pump (IABP) or other cardiac devices, or catecholamines within 7 days prior to screening.  []  [x]   Subject has surgery scheduled to be performed without CPB (i.e., "Off-Pump" surgery).  []  [x]  Subject has surgery scheduled to be performed under conditions of circulatory arrest, including planned deep hypothermic circulatory arrest.  []  [x]  Subject has surgery scheduled for aortic dissection.  []  [x]  Subject has surgery for a condition that is immediately life-threatening as per the discretion of the investigator.  []  [x]  Subject has surgery scheduled to correct major congenital heart defect.   []  [x]  Subject has current or previous malignant disease. Subjects with a history of cancer are considered eligible if the subject has undergone therapy and the subject has been considered disease free or progression free for at least 5 years. Subject with completely excised basal cell carcinoma or squamous cell carcinoma of the skin and completely excised cervical cancer in situ are also considered eligible.    Preoperatively at the Day of Surgery:  []  []  Exclusion criteria 1 to 17 are applicable at this time.  []  []  Subject has AKI (any stage) present at presurgery baseline at the discretion of the investigator.  []  []  Subject has known or suspected infection/sepsis at time of presurgery baseline at the discretion of investigator.    Perioperative Exclusion Criteria:  []  []  Subject requires Extracorporeal Membrane Oxygenation (ECMO) during or after completion of surgery.  []  []  Subject requires ventricular assist device (VAD) during or  after completion of surgery.  []  []  Subject has surgery performed "Off-Pump" at any time during surgery.    General:  []  []  Subject has other condition, which, in the investigator's opinion, makes the subject unsuitable for study participation.  []  []  Male subject who is pregnant or lactating or has a positive pregnancy test within 72 hours prior to screening and/or randomization, has been pregnant within 6 months before screening assessment or breastfeeding within 3  months before screening or who is planning to become pregnant within the total study period.  []  []  Subject has a known or suspected hypersensitivity to [id number] or any components of the formulation used.  []  []  Subject is an [place] or the Musician (CRO) involved in the study.    Jadin Kagel 01/20/2018 1200

## 2018-01-20 NOTE — Progress Notes (Signed)
Prescription called in at [facility] [address].  Left voicemail for patient with permission

## 2018-01-20 NOTE — Pre-Procedure Instructions (Signed)
[place]  01/20/2018     [place], [place] [address] [place] [place] [address] Phone: [phone] Fax: [place], [facility] [address] [place] [place] [address] Phone: [phone] Fax: [phone]   Your procedure is scheduled on Thursday, Jan. 23rd   Report to [facility] Admitting at 5:30 AM             (posted surgery time 7:30a - 2:17p)   Call this number if you have problems the morning of surgery:  [phone]   Remember:  Do not eat any foods or drink any liquids after midnight, Wednesday.  Take these medicines the morning of surgery with A SIP OF WATER :  allopurinol (ZYLOPRIM) amLODipine (NORVASC)  atenolol (TENORMIN) finasteride (PROSCAR)  tamsulosin (FLOMAX)   7 days prior to surgery STOP taking any Aspirin (unless otherwise instructed by your surgeon), Aleve, Naproxen, Ibuprofen, Motrin, Advil, Goody's, BC's, all herbal medications, fish oil, and all vitamins.             Follow your surgeon's instructions on when to stop Aspirin.  If no instructions were given by your surgeon then you will need to call the office to get those instructions.     Eliquis, per Dr. Ricard Dillon' instructions to be STOPPED 7 days prior to surgery                [facility]- Preparing For Surgery  Before surgery, you can play an important role. Because skin is not sterile, your skin needs to be as free of germs as possible. You can reduce the number of germs on your skin by washing with CHG (chlorahexidine gluconate) Soap before surgery.  CHG is an antiseptic cleaner which kills germs and bonds with the skin to continue killing germs even after washing.    Oral Hygiene is also important to reduce your risk of infection.  Remember - BRUSH YOUR TEETH THE MORNING OF SURGERY WITH YOUR REGULAR TOOTHPASTE  Please do not use if you have an  allergy to CHG or antibacterial soaps. If your skin becomes reddened/irritated stop using the CHG.  Do not shave (including legs and underarms) for at least 48 hours prior to first CHG shower. It is OK to shave your face.  Please follow these instructions carefully.   1. Shower the NIGHT BEFORE SURGERY and the MORNING OF SURGERY with CHG.   2. If you chose to wash your hair, wash your hair first as usual with your normal shampoo.  3. After you shampoo, rinse your hair and body thoroughly to remove the shampoo.  4. Use CHG as you would any other liquid soap. You can apply CHG directly to the skin and wash gently with a scrungie or a clean washcloth.   5. Apply the CHG Soap to your body ONLY FROM THE NECK DOWN.  Do not use on open wounds or open sores. Avoid contact with your eyes, ears, mouth and genitals (private parts). Wash Face and genitals (private parts)  with your normal soap.  6. Wash thoroughly, paying special attention to the area where your surgery will be performed.  7. Thoroughly rinse your body with warm water from the neck down.  8. DO NOT shower/wash with your normal soap after using and rinsing off the CHG Soap.  9. Pat yourself dry with a CLEAN TOWEL.  10. Wear CLEAN PAJAMAS to  bed the night before surgery, wear comfortable clothes the morning of surgery  11. Place CLEAN SHEETS on your bed the night of your first shower and DO NOT SLEEP WITH PETS.  Day of Surgery:  Do not apply any deodorants/lotions.  Please wear clean clothes to the hospital/surgery center.   Remember to brush your teeth WITH YOUR REGULAR TOOTHPASTE.   Do not wear jewelry - NO rings or watches.  Do not wear lotions, colognes or deodorant.             Men may shave face and neck.  Do not bring valuables to the hospital.  [facility] is not responsible for any belongings or valuables.  Contacts, dentures or bridgework may not be worn into surgery.  Leave your suitcase in the car.  After surgery  it may be brought to your room.  For patients admitted to the hospital, discharge time will be determined by your treatment team.  Please read over the following fact sheets that you were given. Pain Booklet, MRSA Information and Surgical Site Infection Prevention

## 2018-01-20 NOTE — Patient Instructions (Signed)
Stop taking Eliquis 7 days prior to surgery (Thursday 1/16)  Continue taking all other medications without change through the day before surgery.  Have nothing to eat or drink after midnight the night before surgery.  On the morning of surgery take only allopurinol and atenolol with a sip of water.

## 2018-01-20 NOTE — Research (Signed)
ASTELLAS Informed Consent   Subject Name: [name]  Subject met inclusion and exclusion criteria.  The informed consent form, study requirements and expectations were reviewed with the subject and questions and concerns were addressed prior to the signing of the consent form.  The subject verbalized understanding of the trail requirements.  The subject agreed to participate in the ASTELLAS trial and signed the informed consent.  The informed consent was obtained prior to performance of any protocol-specific procedures for the subject.  A copy of the signed informed consent was given to the subject and a copy was placed in the subject's medical record.   Avrom Robarts RRT, RCIS 01/20/2018

## 2018-01-29 ENCOUNTER — Telehealth: Payer: Self-pay | Admitting: Cardiology

## 2018-01-29 MED ORDER — SODIUM CHLORIDE 0.9 % IV SOLN
1.5000 g | INTRAVENOUS | Status: AC
  Administered 2018-01-30: 1.5 g via INTRAVENOUS
  Filled 2018-01-29: qty 1.5

## 2018-01-29 MED ORDER — SODIUM CHLORIDE 0.9 % IV SOLN
INTRAVENOUS | Status: DC
  Filled 2018-01-29: qty 30

## 2018-01-29 MED ORDER — TRANEXAMIC ACID (OHS) PUMP PRIME SOLUTION
2.0000 mg/kg | INTRAVENOUS | Status: DC
  Filled 2018-01-29: qty 1.46

## 2018-01-29 MED ORDER — NITROGLYCERIN IN D5W 200-5 MCG/ML-% IV SOLN
2.0000 ug/min | INTRAVENOUS | Status: DC
  Filled 2018-01-29: qty 250

## 2018-01-29 MED ORDER — MILRINONE LACTATE IN DEXTROSE 20-5 MG/100ML-% IV SOLN
0.3000 ug/kg/min | INTRAVENOUS | Status: DC
  Filled 2018-01-29: qty 100

## 2018-01-29 MED ORDER — PLASMA-LYTE 148 IV SOLN
INTRAVENOUS | Status: AC
  Administered 2018-01-30: 500 mL
  Filled 2018-01-29: qty 2.5

## 2018-01-29 MED ORDER — INSULIN REGULAR(HUMAN) IN NACL 100-0.9 UT/100ML-% IV SOLN
INTRAVENOUS | Status: AC
  Administered 2018-01-30: 1 [IU]/h via INTRAVENOUS
  Filled 2018-01-29: qty 100

## 2018-01-29 MED ORDER — SODIUM CHLORIDE 0.9 % IV SOLN
750.0000 mg | INTRAVENOUS | Status: DC
  Filled 2018-01-29: qty 750

## 2018-01-29 MED ORDER — KENNESTONE BLOOD CARDIOPLEGIA VIAL
13.0000 mL | Status: DC
  Filled 2018-01-29: qty 13

## 2018-01-29 MED ORDER — KENNESTONE BLOOD CARDIOPLEGIA (KBC) MANNITOL SYRINGE (20%, 32ML)
32.0000 mL | INTRAVENOUS | Status: DC
  Filled 2018-01-29: qty 32

## 2018-01-29 MED ORDER — TRANEXAMIC ACID (OHS) BOLUS VIA INFUSION
15.0000 mg/kg | INTRAVENOUS | Status: AC
  Administered 2018-01-30: 1095 mg via INTRAVENOUS
  Filled 2018-01-29: qty 1095

## 2018-01-29 MED ORDER — EPINEPHRINE PF 1 MG/ML IJ SOLN
0.0000 ug/min | INTRAVENOUS | Status: DC
  Filled 2018-01-29: qty 4

## 2018-01-29 MED ORDER — DEXMEDETOMIDINE HCL IN NACL 400 MCG/100ML IV SOLN
0.1000 ug/kg/h | INTRAVENOUS | Status: AC
  Administered 2018-01-30: .3 ug/kg/h via INTRAVENOUS
  Filled 2018-01-29: qty 100

## 2018-01-29 MED ORDER — VANCOMYCIN HCL 1000 MG IV SOLR
INTRAVENOUS | Status: AC
  Administered 2018-01-30: 1000 mL
  Filled 2018-01-29: qty 1000

## 2018-01-29 MED ORDER — PHENYLEPHRINE HCL-NACL 20-0.9 MG/250ML-% IV SOLN
30.0000 ug/min | INTRAVENOUS | Status: AC
  Administered 2018-01-30: 25 ug/min via INTRAVENOUS
  Filled 2018-01-29: qty 250

## 2018-01-29 MED ORDER — GLUTARALDEHYDE 0.625% SOAKING SOLUTION
TOPICAL | Status: DC
  Filled 2018-01-29 (×2): qty 50

## 2018-01-29 MED ORDER — DOPAMINE-DEXTROSE 3.2-5 MG/ML-% IV SOLN
0.0000 ug/kg/min | INTRAVENOUS | Status: DC
  Filled 2018-01-29: qty 250

## 2018-01-29 MED ORDER — MAGNESIUM SULFATE 50 % IJ SOLN
40.0000 meq | INTRAMUSCULAR | Status: DC
  Filled 2018-01-29: qty 9.85

## 2018-01-29 MED ORDER — VANCOMYCIN HCL 10 G IV SOLR
1250.0000 mg | INTRAVENOUS | Status: AC
  Administered 2018-01-30 (×2): 1250 mg via INTRAVENOUS
  Filled 2018-01-29: qty 1250

## 2018-01-29 MED ORDER — NOREPINEPHRINE-SODIUM CHLORIDE 4-0.9 MG/250ML-% IV SOLN
0.0000 ug/min | INTRAVENOUS | Status: DC
  Filled 2018-01-29: qty 250

## 2018-01-29 MED ORDER — POTASSIUM CHLORIDE 2 MEQ/ML IV SOLN
80.0000 meq | INTRAVENOUS | Status: DC
  Filled 2018-01-29: qty 40

## 2018-01-29 MED ORDER — TRANEXAMIC ACID 1000 MG/10ML IV SOLN
1.5000 mg/kg/h | INTRAVENOUS | Status: AC
  Administered 2018-01-30: 1.5 mg/kg/h via INTRAVENOUS
  Filled 2018-01-29: qty 25

## 2018-01-29 NOTE — Telephone Encounter (Signed)
New message      Pt stated that his sx has been  Moved to 1/23 and wants an earlier appt. Im sorry but I am  confused with what pt is asking when his appt on 2/3 is post sx. Please follow up with pt .

## 2018-01-29 NOTE — Anesthesia Preprocedure Evaluation (Addendum)
Anesthesia Evaluation    Reviewed: Allergy & Precautions, H&P , Patient's Chart, lab work & pertinent test results  History of Anesthesia Complications Negative for: history of anesthetic complications  Airway Mallampati: II  TM Distance: >3 FB Neck ROM: full    Dental  (+) Teeth Intact, Dental Advisory Given, Caps,    Pulmonary neg pulmonary ROS,    breath sounds clear to auscultation       Cardiovascular hypertension, Pt. on medications and Pt. on home beta blockers + CAD and + DOE  + dysrhythmias Atrial Fibrillation + Valvular Problems/Murmurs MR  Rhythm:regular Rate:Normal  1.  Nonobstructive coronary artery disease with no significant change compared to 2012 cardiac catheterization study 2.  Large V waves in the pulmonary capillary wedge tracing consistent with severe mitral insufficiency  Recommend: referral to cardiac surgery for further evaluation of treatment options (surgical mitral valve repair + Maze versus percutaneous mitral valve repair)  10/19 echo Study Conclusions  - Left ventricle: Hypertrophy was noted. Systolic function was   normal. The estimated ejection fraction was in the range of 60%   to 65%. - Aortic valve: Normal appearing bioprosthetic AVR with mean   gradient 9 mmHg no peri valvular regurgitation. - Mitral valve: Severe grade 3-4 MR eccentric anterior due to   prolapse of medial aspect of P2 scallop. - Left atrium: The atrium was dilated. No evidence of thrombus in   the atrial cavity or appendage. - Right ventricle: The cavity size was dilated. - Right atrium: The atrium was dilated. 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 - Tricuspid valve: Bileaflet prolapse with severe TR. There was   severe regurgitation.   Neuro/Psych CVA negative psych ROS   GI/Hepatic negative GI ROS, Neg liver ROS,    Endo/Other  diabetes  Renal/GU negative Renal ROS     Musculoskeletal   Abdominal   Peds  Hematology   Anesthesia Other Findings   Reproductive/Obstetrics                            Anesthesia Physical  Anesthesia Plan  ASA: IV  Anesthesia Plan: General   Post-op Pain Management:    Induction: Intravenous  PONV Risk Score and Plan: Ondansetron and Dexamethasone  Airway Management Planned: Oral ETT  Additional Equipment: Arterial line, PA Cath, 3D TEE and Ultrasound Guidance Line Placement  Intra-op Plan:   Post-operative Plan: Post-operative intubation/ventilation  Informed Consent:   Plan Discussed with: Anesthesiologist  Anesthesia Plan Comments:         Anesthesia Quick Evaluation

## 2018-01-29 NOTE — Telephone Encounter (Signed)
S/w pt moved pt's appt out due to surgery being moved.  Pt will come in on Feb 18 to see LG.  Will send to [place] to [place].

## 2018-01-30 ENCOUNTER — Inpatient Hospital Stay (HOSPITAL_COMMUNITY)
Admission: RE | Admit: 2018-01-30 | Discharge: 2018-01-30 | Disposition: A | Discharge status: Home / Self Care | Payer: Medicare Other | Source: Home / Self Care | Attending: Thoracic Surgery (Cardiothoracic Vascular Surgery) | Admitting: Thoracic Surgery (Cardiothoracic Vascular Surgery)

## 2018-01-30 ENCOUNTER — Other Ambulatory Visit: Payer: Self-pay

## 2018-01-30 ENCOUNTER — Inpatient Hospital Stay (HOSPITAL_COMMUNITY)
Admission: RE | Admit: 2018-01-30 | Discharge: 2018-02-05 | DRG: 219 | Disposition: A | Discharge status: Home / Self Care | Payer: Medicare Other | Attending: Thoracic Surgery (Cardiothoracic Vascular Surgery) | Admitting: Thoracic Surgery (Cardiothoracic Vascular Surgery)

## 2018-01-30 ENCOUNTER — Inpatient Hospital Stay (HOSPITAL_COMMUNITY): Payer: Medicare Other | Admitting: Vascular Surgery

## 2018-01-30 ENCOUNTER — Inpatient Hospital Stay (HOSPITAL_COMMUNITY): Payer: Medicare Other

## 2018-01-30 ENCOUNTER — Inpatient Hospital Stay (HOSPITAL_COMMUNITY): Payer: Medicare Other | Admitting: Certified Registered"

## 2018-01-30 ENCOUNTER — Encounter (HOSPITAL_COMMUNITY): Payer: Self-pay | Admitting: Thoracic Surgery (Cardiothoracic Vascular Surgery)

## 2018-01-30 ENCOUNTER — Inpatient Hospital Stay (HOSPITAL_COMMUNITY)
Admission: RE | Disposition: A | Discharge status: Home / Self Care | Payer: Self-pay | Source: Home / Self Care | Attending: Thoracic Surgery (Cardiothoracic Vascular Surgery)

## 2018-01-30 DIAGNOSIS — Z7901 Long term (current) use of anticoagulants: Secondary | ICD-10-CM | POA: Diagnosis not present

## 2018-01-30 DIAGNOSIS — I361 Nonrheumatic tricuspid (valve) insufficiency: Secondary | ICD-10-CM

## 2018-01-30 DIAGNOSIS — I2510 Atherosclerotic heart disease of native coronary artery without angina pectoris: Secondary | ICD-10-CM | POA: Diagnosis present

## 2018-01-30 DIAGNOSIS — R338 Other retention of urine: Secondary | ICD-10-CM | POA: Diagnosis present

## 2018-01-30 DIAGNOSIS — D62 Acute posthemorrhagic anemia: Secondary | ICD-10-CM | POA: Diagnosis not present

## 2018-01-30 DIAGNOSIS — Z952 Presence of prosthetic heart valve: Secondary | ICD-10-CM

## 2018-01-30 DIAGNOSIS — I071 Rheumatic tricuspid insufficiency: Secondary | ICD-10-CM | POA: Diagnosis present

## 2018-01-30 DIAGNOSIS — I110 Hypertensive heart disease with heart failure: Secondary | ICD-10-CM | POA: Diagnosis present

## 2018-01-30 DIAGNOSIS — Z882 Allergy status to sulfonamides status: Secondary | ICD-10-CM

## 2018-01-30 DIAGNOSIS — N401 Enlarged prostate with lower urinary tract symptoms: Secondary | ICD-10-CM | POA: Diagnosis present

## 2018-01-30 DIAGNOSIS — I081 Rheumatic disorders of both mitral and tricuspid valves: Secondary | ICD-10-CM | POA: Diagnosis present

## 2018-01-30 DIAGNOSIS — E119 Type 2 diabetes mellitus without complications: Secondary | ICD-10-CM

## 2018-01-30 DIAGNOSIS — D696 Thrombocytopenia, unspecified: Secondary | ICD-10-CM | POA: Diagnosis not present

## 2018-01-30 DIAGNOSIS — I5033 Acute on chronic diastolic (congestive) heart failure: Secondary | ICD-10-CM | POA: Diagnosis not present

## 2018-01-30 DIAGNOSIS — I10 Essential (primary) hypertension: Secondary | ICD-10-CM | POA: Diagnosis present

## 2018-01-30 DIAGNOSIS — R339 Retention of urine, unspecified: Secondary | ICD-10-CM | POA: Diagnosis not present

## 2018-01-30 DIAGNOSIS — I480 Paroxysmal atrial fibrillation: Secondary | ICD-10-CM

## 2018-01-30 DIAGNOSIS — Z79899 Other long term (current) drug therapy: Secondary | ICD-10-CM | POA: Diagnosis not present

## 2018-01-30 DIAGNOSIS — Z8679 Personal history of other diseases of the circulatory system: Secondary | ICD-10-CM

## 2018-01-30 DIAGNOSIS — I340 Nonrheumatic mitral (valve) insufficiency: Secondary | ICD-10-CM

## 2018-01-30 DIAGNOSIS — Z9889 Other specified postprocedural states: Secondary | ICD-10-CM

## 2018-01-30 DIAGNOSIS — I498 Other specified cardiac arrhythmias: Secondary | ICD-10-CM | POA: Diagnosis not present

## 2018-01-30 DIAGNOSIS — J9811 Atelectasis: Secondary | ICD-10-CM

## 2018-01-30 DIAGNOSIS — E1140 Type 2 diabetes mellitus with diabetic neuropathy, unspecified: Secondary | ICD-10-CM | POA: Diagnosis present

## 2018-01-30 DIAGNOSIS — I341 Nonrheumatic mitral (valve) prolapse: Secondary | ICD-10-CM | POA: Diagnosis present

## 2018-01-30 DIAGNOSIS — Z953 Presence of xenogenic heart valve: Secondary | ICD-10-CM | POA: Diagnosis not present

## 2018-01-30 DIAGNOSIS — M109 Gout, unspecified: Secondary | ICD-10-CM | POA: Diagnosis present

## 2018-01-30 DIAGNOSIS — I4891 Unspecified atrial fibrillation: Secondary | ICD-10-CM | POA: Diagnosis present

## 2018-01-30 DIAGNOSIS — I4819 Other persistent atrial fibrillation: Secondary | ICD-10-CM | POA: Diagnosis present

## 2018-01-30 DIAGNOSIS — I4821 Permanent atrial fibrillation: Secondary | ICD-10-CM | POA: Diagnosis present

## 2018-01-30 DIAGNOSIS — J90 Pleural effusion, not elsewhere classified: Secondary | ICD-10-CM

## 2018-01-30 DIAGNOSIS — Z9689 Presence of other specified functional implants: Secondary | ICD-10-CM

## 2018-01-30 DIAGNOSIS — Z8673 Personal history of transient ischemic attack (TIA), and cerebral infarction without residual deficits: Secondary | ICD-10-CM

## 2018-01-30 HISTORY — PX: MAZE: SHX5063

## 2018-01-30 HISTORY — DX: Other persistent atrial fibrillation: I48.19

## 2018-01-30 HISTORY — DX: Other specified postprocedural states: Z98.890

## 2018-01-30 HISTORY — PX: TRICUSPID VALVE REPLACEMENT: SHX816

## 2018-01-30 HISTORY — DX: Personal history of other diseases of the circulatory system: Z86.79

## 2018-01-30 HISTORY — PX: CLIPPING OF ATRIAL APPENDAGE: SHX5773

## 2018-01-30 HISTORY — PX: MITRAL VALVE REPAIR: SHX2039

## 2018-01-30 LAB — CBC
HCT: 29.4 % — ABNORMAL LOW (ref 39.0–52.0)
HCT: 27.3 % — ABNORMAL LOW (ref 39.0–52.0)
Hemoglobin: 10 g/dL — ABNORMAL LOW (ref 13.0–17.0)
Hemoglobin: 9.3 g/dL — ABNORMAL LOW (ref 13.0–17.0)
MCH: 33.2 pg (ref 26.0–34.0)
MCH: 33.7 pg (ref 26.0–34.0)
MCHC: 34 g/dL (ref 30.0–36.0)
MCHC: 34.1 g/dL (ref 30.0–36.0)
MCV: 97.7 fL (ref 80.0–100.0)
MCV: 98.9 fL (ref 80.0–100.0)
Platelets: 130 10*3/uL — ABNORMAL LOW (ref 150–400)
Platelets: 124 10*3/uL — ABNORMAL LOW (ref 150–400)
RBC: 3.01 MIL/uL — ABNORMAL LOW (ref 4.22–5.81)
RBC: 2.76 MIL/uL — ABNORMAL LOW (ref 4.22–5.81)
RDW: 13.2 % (ref 11.5–15.5)
RDW: 13.2 % (ref 11.5–15.5)
WBC: 12.5 10*3/uL — ABNORMAL HIGH (ref 4.0–10.5)
WBC: 12.7 10*3/uL — ABNORMAL HIGH (ref 4.0–10.5)
nRBC: 0 % (ref 0.0–0.2)
nRBC: 0 % (ref 0.0–0.2)

## 2018-01-30 LAB — POCT I-STAT 7, (LYTES, BLD GAS, ICA,H+H)
Acid-base deficit: 2 mmol/L (ref 0.0–2.0)
Acid-base deficit: 3 mmol/L — ABNORMAL HIGH (ref 0.0–2.0)
Acid-base deficit: 7 mmol/L — ABNORMAL HIGH (ref 0.0–2.0)
Bicarbonate: 23.3 mmol/L (ref 20.0–28.0)
Bicarbonate: 21.3 mmol/L (ref 20.0–28.0)
Bicarbonate: 18.7 mmol/L — ABNORMAL LOW (ref 20.0–28.0)
Calcium, Ion: 1.09 mmol/L — ABNORMAL LOW (ref 1.15–1.40)
Calcium, Ion: 1.14 mmol/L — ABNORMAL LOW (ref 1.15–1.40)
Calcium, Ion: 1.16 mmol/L (ref 1.15–1.40)
HCT: 27 % — ABNORMAL LOW (ref 39.0–52.0)
HCT: 29 % — ABNORMAL LOW (ref 39.0–52.0)
HCT: 26 % — ABNORMAL LOW (ref 39.0–52.0)
Hemoglobin: 9.2 g/dL — ABNORMAL LOW (ref 13.0–17.0)
Hemoglobin: 9.9 g/dL — ABNORMAL LOW (ref 13.0–17.0)
Hemoglobin: 8.8 g/dL — ABNORMAL LOW (ref 13.0–17.0)
O2 Saturation: 100 %
O2 Saturation: 99 %
O2 Saturation: 98 %
Patient temperature: 36.4
Patient temperature: 35.7
Potassium: 3.8 mmol/L (ref 3.5–5.1)
Potassium: 3.9 mmol/L (ref 3.5–5.1)
Potassium: 4.1 mmol/L (ref 3.5–5.1)
Sodium: 144 mmol/L (ref 135–145)
Sodium: 145 mmol/L (ref 135–145)
Sodium: 143 mmol/L (ref 135–145)
TCO2: 24 mmol/L (ref 22–32)
TCO2: 22 mmol/L (ref 22–32)
TCO2: 20 mmol/L — ABNORMAL LOW (ref 22–32)
pCO2 arterial: 38.9 mmHg (ref 32.0–48.0)
pCO2 arterial: 31.7 mmHg — ABNORMAL LOW (ref 32.0–48.0)
pCO2 arterial: 34 mmHg (ref 32.0–48.0)
pH, Arterial: 7.386 (ref 7.350–7.450)
pH, Arterial: 7.433 (ref 7.350–7.450)
pH, Arterial: 7.342 — ABNORMAL LOW (ref 7.350–7.450)
pO2, Arterial: 367 mmHg — ABNORMAL HIGH (ref 83.0–108.0)
pO2, Arterial: 148 mmHg — ABNORMAL HIGH (ref 83.0–108.0)
pO2, Arterial: 97 mmHg (ref 83.0–108.0)

## 2018-01-30 LAB — HEMOGLOBIN AND HEMATOCRIT, BLOOD
HCT: 25.1 % — ABNORMAL LOW (ref 39.0–52.0)
Hemoglobin: 8.2 g/dL — ABNORMAL LOW (ref 13.0–17.0)

## 2018-01-30 LAB — POCT I-STAT 4, (NA,K, GLUC, HGB,HCT)
Glucose, Bld: 110 mg/dL — ABNORMAL HIGH (ref 70–99)
Glucose, Bld: 112 mg/dL — ABNORMAL HIGH (ref 70–99)
Glucose, Bld: 133 mg/dL — ABNORMAL HIGH (ref 70–99)
Glucose, Bld: 147 mg/dL — ABNORMAL HIGH (ref 70–99)
Glucose, Bld: 109 mg/dL — ABNORMAL HIGH (ref 70–99)
Glucose, Bld: 136 mg/dL — ABNORMAL HIGH (ref 70–99)
HCT: 36 % — ABNORMAL LOW (ref 39.0–52.0)
HCT: 31 % — ABNORMAL LOW (ref 39.0–52.0)
HCT: 22 % — ABNORMAL LOW (ref 39.0–52.0)
HCT: 24 % — ABNORMAL LOW (ref 39.0–52.0)
HCT: 24 % — ABNORMAL LOW (ref 39.0–52.0)
HCT: 26 % — ABNORMAL LOW (ref 39.0–52.0)
Hemoglobin: 12.2 g/dL — ABNORMAL LOW (ref 13.0–17.0)
Hemoglobin: 10.5 g/dL — ABNORMAL LOW (ref 13.0–17.0)
Hemoglobin: 7.5 g/dL — ABNORMAL LOW (ref 13.0–17.0)
Hemoglobin: 8.2 g/dL — ABNORMAL LOW (ref 13.0–17.0)
Hemoglobin: 8.2 g/dL — ABNORMAL LOW (ref 13.0–17.0)
Hemoglobin: 8.8 g/dL — ABNORMAL LOW (ref 13.0–17.0)
Potassium: 3.8 mmol/L (ref 3.5–5.1)
Potassium: 3.9 mmol/L (ref 3.5–5.1)
Potassium: 3.6 mmol/L (ref 3.5–5.1)
Potassium: 3.9 mmol/L (ref 3.5–5.1)
Potassium: 3.8 mmol/L (ref 3.5–5.1)
Potassium: 4 mmol/L (ref 3.5–5.1)
Sodium: 143 mmol/L (ref 135–145)
Sodium: 144 mmol/L (ref 135–145)
Sodium: 138 mmol/L (ref 135–145)
Sodium: 141 mmol/L (ref 135–145)
Sodium: 144 mmol/L (ref 135–145)
Sodium: 145 mmol/L (ref 135–145)

## 2018-01-30 LAB — BASIC METABOLIC PANEL
Anion gap: 7 (ref 5–15)
BUN: 13 mg/dL (ref 8–23)
CO2: 19 mmol/L — ABNORMAL LOW (ref 22–32)
Calcium: 7.6 mg/dL — ABNORMAL LOW (ref 8.9–10.3)
Chloride: 115 mmol/L — ABNORMAL HIGH (ref 98–111)
Creatinine, Ser: 0.89 mg/dL (ref 0.61–1.24)
GFR calc Af Amer: >60 mL/min (ref >60)
GFR calc non Af Amer: >60 mL/min (ref >60)
Glucose, Bld: 196 mg/dL — ABNORMAL HIGH (ref 70–99)
Potassium: 4 mmol/L (ref 3.5–5.1)
Sodium: 141 mmol/L (ref 135–145)

## 2018-01-30 LAB — PLATELET COUNT: Platelets: 70 10*3/uL — ABNORMAL LOW (ref 150–400)

## 2018-01-30 LAB — PREPARE RBC (CROSSMATCH)

## 2018-01-30 LAB — CREATININE, SERUM
Creatinine, Ser: 0.94 mg/dL (ref 0.61–1.24)
GFR calc Af Amer: >60 mL/min (ref >60)
GFR calc non Af Amer: >60 mL/min (ref >60)

## 2018-01-30 LAB — PROTIME-INR
INR: 1.07
INR: 1.56
Prothrombin Time: 13.8 seconds (ref 11.4–15.2)
Prothrombin Time: 18.4 seconds — ABNORMAL HIGH (ref 11.4–15.2)

## 2018-01-30 LAB — APTT: aPTT: 42 seconds — ABNORMAL HIGH (ref 24–36)

## 2018-01-30 LAB — GLUCOSE, CAPILLARY
Glucose-Capillary: 105 mg/dL — ABNORMAL HIGH (ref 70–99)
Glucose-Capillary: 111 mg/dL — ABNORMAL HIGH (ref 70–99)
Glucose-Capillary: 183 mg/dL — ABNORMAL HIGH (ref 70–99)
Glucose-Capillary: 86 mg/dL (ref 70–99)
Glucose-Capillary: 97 mg/dL (ref 70–99)

## 2018-01-30 LAB — MRSA PCR SCREENING: MRSA by PCR: NEGATIVE

## 2018-01-30 LAB — MAGNESIUM: Magnesium: 2.7 mg/dL — ABNORMAL HIGH (ref 1.7–2.4)

## 2018-01-30 LAB — FIBRINOGEN: Fibrinogen: 234 mg/dL (ref 210–475)

## 2018-01-30 SURGERY — REDO STERNOTOMY
Anesthesia: General | Site: Chest

## 2018-01-30 MED ORDER — TRAMADOL HCL 50 MG PO TABS
50.0000 mg | ORAL_TABLET | ORAL | Status: DC | PRN
  Administered 2018-01-31 – 2018-02-01 (×2): 100 mg via ORAL
  Filled 2018-01-30 (×2): qty 2

## 2018-01-30 MED ORDER — DEXAMETHASONE SODIUM PHOSPHATE 10 MG/ML IJ SOLN
INTRAMUSCULAR | Status: DC | PRN
  Administered 2018-01-30: 5 mg via INTRAVENOUS

## 2018-01-30 MED ORDER — CHLORHEXIDINE GLUCONATE 0.12% ORAL RINSE (MEDLINE KIT)
15.0000 mL | Freq: Two times a day (BID) | OROMUCOSAL | Status: DC
  Administered 2018-01-30 – 2018-01-31 (×2): 15 mL via OROMUCOSAL

## 2018-01-30 MED ORDER — EPHEDRINE SULFATE-NACL 50-0.9 MG/10ML-% IV SOSY
PREFILLED_SYRINGE | INTRAVENOUS | Status: DC | PRN
  Administered 2018-01-30 (×4): 5 mg via INTRAVENOUS
  Administered 2018-01-30 (×3): 10 mg via INTRAVENOUS

## 2018-01-30 MED ORDER — SODIUM CHLORIDE 0.9 % IV SOLN
INTRAVENOUS | Status: DC | PRN
  Administered 2018-01-30: 750 mg via INTRAVENOUS

## 2018-01-30 MED ORDER — 0.9 % SODIUM CHLORIDE (POUR BTL) OPTIME
TOPICAL | Status: DC | PRN
  Administered 2018-01-30: 5000 mL

## 2018-01-30 MED ORDER — INSULIN REGULAR(HUMAN) IN NACL 100-0.9 UT/100ML-% IV SOLN: INTRAVENOUS | Status: DC

## 2018-01-30 MED ORDER — PROPOFOL 10 MG/ML IV BOLUS
INTRAVENOUS | Status: AC
  Filled 2018-01-30: qty 20

## 2018-01-30 MED ORDER — ALLOPURINOL 300 MG PO TABS
150.0000 mg | ORAL_TABLET | Freq: Every day | ORAL | Status: DC
  Administered 2018-01-31 – 2018-02-05 (×6): 150 mg via ORAL
  Filled 2018-01-30 (×6): qty 1

## 2018-01-30 MED ORDER — LACTATED RINGERS IV SOLN
INTRAVENOUS | Status: DC | PRN
  Administered 2018-01-30: 08:00:00 via INTRAVENOUS

## 2018-01-30 MED ORDER — CHLORHEXIDINE GLUCONATE 4 % EX LIQD: 30.0000 mL | CUTANEOUS | Status: DC

## 2018-01-30 MED ORDER — ASPIRIN EC 325 MG PO TBEC: 325.0000 mg | DELAYED_RELEASE_TABLET | Freq: Every day | ORAL | Status: DC

## 2018-01-30 MED ORDER — PROPOFOL 10 MG/ML IV BOLUS
INTRAVENOUS | Status: DC | PRN
  Administered 2018-01-30: 150 mg via INTRAVENOUS

## 2018-01-30 MED ORDER — TAMSULOSIN HCL 0.4 MG PO CAPS
0.8000 mg | ORAL_CAPSULE | Freq: Every day | ORAL | Status: DC
  Administered 2018-01-31 – 2018-02-05 (×6): 0.8 mg via ORAL
  Filled 2018-01-30 (×7): qty 2

## 2018-01-30 MED ORDER — SODIUM CHLORIDE 0.9% FLUSH
10.0000 mL | Freq: Two times a day (BID) | INTRAVENOUS | Status: DC
  Administered 2018-01-31 – 2018-02-03 (×5): 10 mL

## 2018-01-30 MED ORDER — THROMBIN 5000 UNITS EX SOLR
CUTANEOUS | Status: DC | PRN
  Administered 2018-01-30 (×2): 5000 [IU] via TOPICAL

## 2018-01-30 MED ORDER — MORPHINE SULFATE (PF) 2 MG/ML IV SOLN
1.0000 mg | INTRAVENOUS | Status: DC | PRN
  Administered 2018-01-31 – 2018-02-01 (×6): 2 mg via INTRAVENOUS
  Filled 2018-01-30 (×6): qty 1

## 2018-01-30 MED ORDER — PHENYLEPHRINE 40 MCG/ML (10ML) SYRINGE FOR IV PUSH (FOR BLOOD PRESSURE SUPPORT)
PREFILLED_SYRINGE | INTRAVENOUS | Status: DC | PRN
  Administered 2018-01-30: 80 ug via INTRAVENOUS
  Administered 2018-01-30: 160 ug via INTRAVENOUS
  Administered 2018-01-30 (×2): 80 ug via INTRAVENOUS

## 2018-01-30 MED ORDER — SODIUM CHLORIDE 0.9 % IV SOLN
INTRAVENOUS | Status: DC
  Administered 2018-01-30: 16:00:00 via INTRAVENOUS

## 2018-01-30 MED ORDER — CHLORHEXIDINE GLUCONATE 0.12 % MT SOLN
15.0000 mL | OROMUCOSAL | Status: AC
  Administered 2018-01-30: 15 mL via OROMUCOSAL

## 2018-01-30 MED ORDER — ONDANSETRON HCL 4 MG/2ML IJ SOLN
4.0000 mg | Freq: Four times a day (QID) | INTRAMUSCULAR | Status: DC | PRN
  Administered 2018-02-02: 4 mg via INTRAVENOUS
  Filled 2018-01-30: qty 2

## 2018-01-30 MED ORDER — METOPROLOL TARTRATE 5 MG/5ML IV SOLN: 2.5000 mg | INTRAVENOUS | Status: DC | PRN

## 2018-01-30 MED ORDER — LACTATED RINGERS IV SOLN
INTRAVENOUS | Status: DC | PRN
  Administered 2018-01-30: 06:00:00 via INTRAVENOUS

## 2018-01-30 MED ORDER — PANTOPRAZOLE SODIUM 40 MG PO TBEC
40.0000 mg | DELAYED_RELEASE_TABLET | Freq: Every day | ORAL | Status: DC
  Administered 2018-02-01 – 2018-02-05 (×5): 40 mg via ORAL
  Filled 2018-01-30 (×5): qty 1

## 2018-01-30 MED ORDER — HEPARIN SODIUM (PORCINE) 1000 UNIT/ML IJ SOLN
INTRAMUSCULAR | Status: DC | PRN
  Administered 2018-01-30: 27000 [IU] via INTRAVENOUS

## 2018-01-30 MED ORDER — THROMBIN (RECOMBINANT) 5000 UNITS EX SOLR
CUTANEOUS | Status: AC
  Filled 2018-01-30: qty 5000

## 2018-01-30 MED ORDER — FENTANYL CITRATE (PF) 250 MCG/5ML IJ SOLN
INTRAMUSCULAR | Status: AC
  Filled 2018-01-30: qty 20

## 2018-01-30 MED ORDER — THROMBIN 5000 UNITS EX SOLR
OROMUCOSAL | Status: DC | PRN
  Administered 2018-01-30 (×2): 5 mL via TOPICAL

## 2018-01-30 MED ORDER — METOPROLOL TARTRATE 12.5 MG HALF TABLET: 12.5000 mg | ORAL_TABLET | Freq: Once | ORAL | Status: DC

## 2018-01-30 MED ORDER — SODIUM CHLORIDE 0.9 % IV SOLN
1.5000 g | Freq: Two times a day (BID) | INTRAVENOUS | Status: AC
  Administered 2018-01-30 – 2018-02-01 (×4): 1.5 g via INTRAVENOUS
  Filled 2018-01-30 (×4): qty 1.5

## 2018-01-30 MED ORDER — PHENYLEPHRINE HCL-NACL 20-0.9 MG/250ML-% IV SOLN
0.0000 ug/min | INTRAVENOUS | Status: DC
  Administered 2018-01-30: 65 ug/min via INTRAVENOUS
  Administered 2018-01-30: 75 ug/min via INTRAVENOUS
  Filled 2018-01-30 (×2): qty 250

## 2018-01-30 MED ORDER — SODIUM CHLORIDE 0.9 % IV SOLN
INTRAVENOUS | Status: DC
  Administered 2018-01-31: 04:00:00 via INTRAVENOUS

## 2018-01-30 MED ORDER — POTASSIUM CHLORIDE 10 MEQ/50ML IV SOLN
10.0000 meq | INTRAVENOUS | Status: AC
  Administered 2018-01-30 (×3): 10 meq via INTRAVENOUS

## 2018-01-30 MED ORDER — ROCURONIUM BROMIDE 50 MG/5ML IV SOSY
PREFILLED_SYRINGE | INTRAVENOUS | Status: AC
  Filled 2018-01-30: qty 10

## 2018-01-30 MED ORDER — MIDAZOLAM HCL 5 MG/5ML IJ SOLN
INTRAMUSCULAR | Status: DC | PRN
  Administered 2018-01-30 (×2): 1 mg via INTRAVENOUS
  Administered 2018-01-30 (×2): 2 mg via INTRAVENOUS

## 2018-01-30 MED ORDER — LACTATED RINGERS IV SOLN: 500.0000 mL | Freq: Once | INTRAVENOUS | Status: DC | PRN

## 2018-01-30 MED ORDER — MAGNESIUM SULFATE 4 GM/100ML IV SOLN
4.0000 g | Freq: Once | INTRAVENOUS | Status: AC
  Administered 2018-01-30: 4 g via INTRAVENOUS
  Filled 2018-01-30: qty 100

## 2018-01-30 MED ORDER — SODIUM CHLORIDE 0.9% FLUSH
10.0000 mL | INTRAVENOUS | Status: DC | PRN
  Administered 2018-02-02: 10 mL
  Filled 2018-01-30: qty 40

## 2018-01-30 MED ORDER — ARTIFICIAL TEARS OPHTHALMIC OINT
TOPICAL_OINTMENT | OPHTHALMIC | Status: AC
  Filled 2018-01-30: qty 3.5

## 2018-01-30 MED ORDER — LACTATED RINGERS IV SOLN
INTRAVENOUS | Status: DC | PRN
  Administered 2018-01-30 (×2): via INTRAVENOUS

## 2018-01-30 MED ORDER — PHENYLEPHRINE 40 MCG/ML (10ML) SYRINGE FOR IV PUSH (FOR BLOOD PRESSURE SUPPORT)
PREFILLED_SYRINGE | INTRAVENOUS | Status: AC
  Filled 2018-01-30: qty 10

## 2018-01-30 MED ORDER — GLYCOPYRROLATE PF 0.2 MG/ML IJ SOSY
PREFILLED_SYRINGE | INTRAMUSCULAR | Status: AC
  Filled 2018-01-30: qty 1

## 2018-01-30 MED ORDER — MIDAZOLAM HCL (PF) 10 MG/2ML IJ SOLN
INTRAMUSCULAR | Status: AC
  Filled 2018-01-30: qty 2

## 2018-01-30 MED ORDER — DEXMEDETOMIDINE HCL IN NACL 200 MCG/50ML IV SOLN: 0.0000 ug/kg/h | INTRAVENOUS | Status: DC

## 2018-01-30 MED ORDER — ROCURONIUM BROMIDE 50 MG/5ML IV SOSY
PREFILLED_SYRINGE | INTRAVENOUS | Status: AC
  Filled 2018-01-30: qty 5

## 2018-01-30 MED ORDER — ARTIFICIAL TEARS OPHTHALMIC OINT
TOPICAL_OINTMENT | OPHTHALMIC | Status: DC | PRN
  Administered 2018-01-30: 1 via OPHTHALMIC

## 2018-01-30 MED ORDER — PROTAMINE SULFATE 10 MG/ML IV SOLN
INTRAVENOUS | Status: AC
  Filled 2018-01-30: qty 5

## 2018-01-30 MED ORDER — MIDAZOLAM HCL 2 MG/2ML IJ SOLN: 2.0000 mg | INTRAMUSCULAR | Status: DC | PRN

## 2018-01-30 MED ORDER — OXYCODONE HCL 5 MG PO TABS
5.0000 mg | ORAL_TABLET | ORAL | Status: DC | PRN
  Administered 2018-01-30 – 2018-01-31 (×4): 10 mg via ORAL
  Administered 2018-02-01: 5 mg via ORAL
  Administered 2018-02-01: 10 mg via ORAL
  Filled 2018-01-30 (×5): qty 2
  Filled 2018-01-30: qty 1

## 2018-01-30 MED ORDER — SODIUM CHLORIDE 0.9 % IR SOLN
Status: DC | PRN
  Administered 2018-01-30: 3000 mL

## 2018-01-30 MED ORDER — FENTANYL CITRATE (PF) 250 MCG/5ML IJ SOLN
INTRAMUSCULAR | Status: DC | PRN
  Administered 2018-01-30: 150 ug via INTRAVENOUS
  Administered 2018-01-30: 50 ug via INTRAVENOUS
  Administered 2018-01-30: 100 ug via INTRAVENOUS
  Administered 2018-01-30: 50 ug via INTRAVENOUS
  Administered 2018-01-30: 200 ug via INTRAVENOUS

## 2018-01-30 MED ORDER — PROTAMINE SULFATE 10 MG/ML IV SOLN
INTRAVENOUS | Status: AC
  Filled 2018-01-30: qty 25

## 2018-01-30 MED ORDER — LACTATED RINGERS IV SOLN: INTRAVENOUS | Status: DC

## 2018-01-30 MED ORDER — ROCURONIUM BROMIDE 10 MG/ML (PF) SYRINGE
PREFILLED_SYRINGE | INTRAVENOUS | Status: DC | PRN
  Administered 2018-01-30: 30 mg via INTRAVENOUS
  Administered 2018-01-30: 100 mg via INTRAVENOUS
  Administered 2018-01-30 (×2): 50 mg via INTRAVENOUS
  Administered 2018-01-30: 10 mg via INTRAVENOUS

## 2018-01-30 MED ORDER — FAMOTIDINE IN NACL 20-0.9 MG/50ML-% IV SOLN
20.0000 mg | Freq: Two times a day (BID) | INTRAVENOUS | Status: AC
  Administered 2018-01-30 (×2): 20 mg via INTRAVENOUS
  Filled 2018-01-30: qty 50

## 2018-01-30 MED ORDER — EPHEDRINE 5 MG/ML INJ
INTRAVENOUS | Status: AC
  Filled 2018-01-30: qty 10

## 2018-01-30 MED ORDER — LACTATED RINGERS IV SOLN
INTRAVENOUS | Status: DC
  Administered 2018-01-30 – 2018-02-03 (×4): via INTRAVENOUS

## 2018-01-30 MED ORDER — VANCOMYCIN HCL IN DEXTROSE 1-5 GM/200ML-% IV SOLN
1000.0000 mg | Freq: Once | INTRAVENOUS | Status: AC
  Administered 2018-01-30: 1000 mg via INTRAVENOUS
  Filled 2018-01-30: qty 200

## 2018-01-30 MED ORDER — ACETAMINOPHEN 160 MG/5ML PO SOLN: 650.0000 mg | Freq: Once | ORAL | Status: AC

## 2018-01-30 MED ORDER — ALBUMIN HUMAN 5 % IV SOLN
250.0000 mL | INTRAVENOUS | Status: AC | PRN
  Administered 2018-01-30 (×3): 12.5 g via INTRAVENOUS
  Filled 2018-01-30: qty 250

## 2018-01-30 MED ORDER — INSULIN ASPART 100 UNIT/ML ~~LOC~~ SOLN
0.0000 [IU] | SUBCUTANEOUS | Status: DC
  Administered 2018-01-30: 4 [IU] via SUBCUTANEOUS
  Administered 2018-01-31 (×2): 2 [IU] via SUBCUTANEOUS
  Administered 2018-01-31: 4 [IU] via SUBCUTANEOUS
  Administered 2018-01-31 (×2): 2 [IU] via SUBCUTANEOUS

## 2018-01-30 MED ORDER — DOCUSATE SODIUM 100 MG PO CAPS
200.0000 mg | ORAL_CAPSULE | Freq: Every day | ORAL | Status: DC
  Administered 2018-01-31 – 2018-02-05 (×6): 200 mg via ORAL
  Filled 2018-01-30 (×6): qty 2

## 2018-01-30 MED ORDER — ACETAMINOPHEN 650 MG RE SUPP
650.0000 mg | Freq: Once | RECTAL | Status: AC
  Administered 2018-01-30: 650 mg via RECTAL

## 2018-01-30 MED ORDER — ASPIRIN 81 MG PO CHEW: 324.0000 mg | CHEWABLE_TABLET | Freq: Every day | ORAL | Status: DC

## 2018-01-30 MED ORDER — CHLORHEXIDINE GLUCONATE 0.12 % MT SOLN
15.0000 mL | Freq: Once | OROMUCOSAL | Status: AC
  Administered 2018-01-30: 15 mL via OROMUCOSAL
  Filled 2018-01-30: qty 15

## 2018-01-30 MED ORDER — NITROGLYCERIN IN D5W 200-5 MCG/ML-% IV SOLN: 0.0000 ug/min | INTRAVENOUS | Status: DC

## 2018-01-30 MED ORDER — ONDANSETRON HCL 4 MG/2ML IJ SOLN
INTRAMUSCULAR | Status: DC | PRN
  Administered 2018-01-30: 4 mg via INTRAVENOUS

## 2018-01-30 MED ORDER — ACETAMINOPHEN 160 MG/5ML PO SOLN
1000.0000 mg | Freq: Four times a day (QID) | ORAL | Status: DC
  Administered 2018-01-30: 1000 mg
  Filled 2018-01-30: qty 40.6

## 2018-01-30 MED ORDER — DOPAMINE-DEXTROSE 3.2-5 MG/ML-% IV SOLN
0.0000 ug/kg/min | INTRAVENOUS | Status: DC
  Administered 2018-01-30: 3 ug/kg/min via INTRAVENOUS

## 2018-01-30 MED ORDER — SODIUM CHLORIDE 0.45 % IV SOLN
INTRAVENOUS | Status: DC | PRN
  Administered 2018-01-30: 16:00:00 via INTRAVENOUS

## 2018-01-30 MED ORDER — SODIUM CHLORIDE 0.9 % IV SOLN
INTRAVENOUS | Status: DC | PRN
  Administered 2018-01-30: 14:00:00 via INTRAVENOUS

## 2018-01-30 MED ORDER — DEXAMETHASONE SODIUM PHOSPHATE 10 MG/ML IJ SOLN
INTRAMUSCULAR | Status: AC
  Filled 2018-01-30: qty 1

## 2018-01-30 MED ORDER — PROTAMINE SULFATE 10 MG/ML IV SOLN
INTRAVENOUS | Status: DC | PRN
  Administered 2018-01-30 (×7): 30 mg via INTRAVENOUS
  Administered 2018-01-30: 40 mg via INTRAVENOUS

## 2018-01-30 MED ORDER — ONDANSETRON HCL 4 MG/2ML IJ SOLN
INTRAMUSCULAR | Status: AC
  Filled 2018-01-30: qty 2

## 2018-01-30 MED ORDER — ACETAMINOPHEN 500 MG PO TABS
1000.0000 mg | ORAL_TABLET | Freq: Four times a day (QID) | ORAL | Status: AC
  Administered 2018-01-31 – 2018-02-03 (×11): 1000 mg via ORAL
  Filled 2018-01-30 (×12): qty 2

## 2018-01-30 MED ORDER — ORAL CARE MOUTH RINSE
15.0000 mL | OROMUCOSAL | Status: DC
  Administered 2018-01-30 – 2018-01-31 (×6): 15 mL via OROMUCOSAL

## 2018-01-30 MED ORDER — INSULIN REGULAR BOLUS VIA INFUSION
0.0000 [IU] | Freq: Three times a day (TID) | INTRAVENOUS | Status: DC
  Filled 2018-01-30: qty 10

## 2018-01-30 MED ORDER — SODIUM CHLORIDE 0.9% FLUSH: 3.0000 mL | INTRAVENOUS | Status: DC | PRN

## 2018-01-30 MED ORDER — SODIUM CHLORIDE (PF) 0.9 % IJ SOLN
OROMUCOSAL | Status: DC | PRN
  Administered 2018-01-30 (×4): 4 mL via TOPICAL

## 2018-01-30 MED ORDER — BISACODYL 5 MG PO TBEC
10.0000 mg | DELAYED_RELEASE_TABLET | Freq: Every day | ORAL | Status: DC
  Administered 2018-01-31 – 2018-02-05 (×6): 10 mg via ORAL
  Filled 2018-01-30 (×6): qty 2

## 2018-01-30 MED ORDER — SODIUM CHLORIDE 0.9% FLUSH: 3.0000 mL | Freq: Two times a day (BID) | INTRAVENOUS | Status: DC

## 2018-01-30 MED ORDER — SODIUM CHLORIDE 0.9 % IV SOLN: 250.0000 mL | INTRAVENOUS | Status: DC

## 2018-01-30 MED ORDER — BISACODYL 10 MG RE SUPP: 10.0000 mg | Freq: Every day | RECTAL | Status: DC

## 2018-01-30 MED ORDER — CHLORHEXIDINE GLUCONATE CLOTH 2 % EX PADS
6.0000 | MEDICATED_PAD | Freq: Every day | CUTANEOUS | Status: DC
  Administered 2018-01-30 – 2018-02-02 (×4): 6 via TOPICAL

## 2018-01-30 SURGICAL SUPPLY — 122 items
ADAPTER CARDIO PERF ANTE/RETRO (ADAPTER) ×3 IMPLANT
APPLICATOR COTTON TIP 6IN STRL (MISCELLANEOUS) IMPLANT
ARTICLIP LAA PROCLIP II 45 (Clip) ×3 IMPLANT
ATTRACTOMAT 16X20 MAGNETIC DRP (DRAPES) ×3 IMPLANT
BAG DECANTER FOR FLEXI CONT (MISCELLANEOUS) ×3 IMPLANT
BLADE CORE FAN STRYKER (BLADE) ×3 IMPLANT
BLADE STERNUM SYSTEM 6 (BLADE) ×3 IMPLANT
BLADE SURG 11 STRL SS (BLADE) ×3 IMPLANT
BOOT SUTURE AID YELLOW STND (SUTURE) ×3 IMPLANT
CANISTER SUCT 3000ML PPV (MISCELLANEOUS) ×3 IMPLANT
CANN PRFSN 3/8X14X24FR PCFC (MISCELLANEOUS)
CANN PRFSN 3/8XCNCT ST RT ANG (MISCELLANEOUS)
CANNULA EZ GLIDE AORTIC 21FR (CANNULA) ×3 IMPLANT
CANNULA FEM VENOUS REMOTE 22FR (CANNULA) ×3 IMPLANT
CANNULA FEMORAL ART 14 SM (MISCELLANEOUS) ×3 IMPLANT
CANNULA GUNDRY RCSP 15FR (MISCELLANEOUS) ×3 IMPLANT
CANNULA OPTISITE PERFUSION 18F (CANNULA) ×3 IMPLANT
CANNULA PRFSN 3/8X14X24FR PCFC (MISCELLANEOUS) IMPLANT
CANNULA PRFSN 3/8XCNCT RT ANG (MISCELLANEOUS) IMPLANT
CANNULA VEN MTL TIP RT (MISCELLANEOUS)
CATH FOLEY 2WAY SLVR  5CC 14FR (CATHETERS)
CATH FOLEY 2WAY SLVR 5CC 14FR (CATHETERS) IMPLANT
CATH ROBINSON RED A/P 18FR (CATHETERS) ×3 IMPLANT
CATH THORACIC 28FR RT ANG (CATHETERS) IMPLANT
CATH THORACIC 36FR (CATHETERS) ×3 IMPLANT
CLAMP ISOLATOR SYNERGY LG (MISCELLANEOUS) ×3 IMPLANT
CLAMP OLL ABLATION (MISCELLANEOUS) ×3 IMPLANT
CLIP FOGARTY SPRING 6M (CLIP) IMPLANT
CONN 1/2X1/2X1/2  BEN (MISCELLANEOUS) ×1
CONN 1/2X1/2X1/2 BEN (MISCELLANEOUS) ×2 IMPLANT
CONN 3/8X1/2 ST GISH (MISCELLANEOUS) ×6 IMPLANT
CONN ST 1/4X3/8  BEN (MISCELLANEOUS) ×3
CONN ST 1/4X3/8 BEN (MISCELLANEOUS) ×6 IMPLANT
CONNECTOR 1/2X3/8X1/2 3 WAY (MISCELLANEOUS) ×1
CONNECTOR 1/2X3/8X1/2 3WAY (MISCELLANEOUS) ×2 IMPLANT
COVER PROBE W GEL 5X96 (DRAPES) ×6 IMPLANT
COVER SURGICAL LIGHT HANDLE (MISCELLANEOUS) ×6 IMPLANT
CRADLE DONUT ADULT HEAD (MISCELLANEOUS) ×3 IMPLANT
DERMABOND ADVANCED (GAUZE/BANDAGES/DRESSINGS) ×1
DERMABOND ADVANCED .7 DNX12 (GAUZE/BANDAGES/DRESSINGS) ×2 IMPLANT
DEVICE ATRICLIP LAA PRCLPII 45 (Clip) ×2 IMPLANT
DEVICE CLOSURE PERCLS PRGLD 6F (VASCULAR PRODUCTS) ×6 IMPLANT
DEVICE SUT CK QUICK LOAD MINI (Prosthesis & Implant Heart) ×15 IMPLANT
DRAIN CHANNEL 32F RND 10.7 FF (WOUND CARE) ×9 IMPLANT
DRAPE BILATERAL SPLIT (DRAPES) IMPLANT
DRAPE CARDIOVASCULAR INCISE (DRAPES)
DRAPE CV SPLIT W-CLR ANES SCRN (DRAPES) IMPLANT
DRAPE INCISE IOBAN 66X45 STRL (DRAPES) ×6 IMPLANT
DRAPE SLUSH/WARMER DISC (DRAPES) ×3 IMPLANT
DRAPE SRG 135X102X78XABS (DRAPES) IMPLANT
DRSG AQUACEL AG ADV 3.5X14 (GAUZE/BANDAGES/DRESSINGS) ×3 IMPLANT
ELECT REM PT RETURN 9FT ADLT (ELECTROSURGICAL) ×6
ELECTRODE REM PT RTRN 9FT ADLT (ELECTROSURGICAL) ×4 IMPLANT
FELT TEFLON 1X6 (MISCELLANEOUS) ×3 IMPLANT
GAUZE SPONGE 4X4 12PLY STRL (GAUZE/BANDAGES/DRESSINGS) ×3 IMPLANT
GLOVE BIO SURGEON STRL SZ 6 (GLOVE) ×9 IMPLANT
GLOVE BIO SURGEON STRL SZ 6.5 (GLOVE) ×9 IMPLANT
GLOVE ORTHO TXT STRL SZ7.5 (GLOVE) IMPLANT
GOWN STRL REUS W/ TWL LRG LVL3 (GOWN DISPOSABLE) ×8 IMPLANT
GOWN STRL REUS W/TWL LRG LVL3 (GOWN DISPOSABLE) ×4
HEMOSTAT POWDER SURGIFOAM 1G (HEMOSTASIS) ×9 IMPLANT
INSERT FOGARTY XLG (MISCELLANEOUS) ×3 IMPLANT
KIT BASIN OR (CUSTOM PROCEDURE TRAY) ×3 IMPLANT
KIT DILATOR VASC 18G NDL (KITS) ×3 IMPLANT
KIT DRAINAGE VACCUM ASSIST (KITS) ×3 IMPLANT
KIT SUCTION CATH 14FR (SUCTIONS) ×12 IMPLANT
KIT SUT CK MINI COMBO 4X17 (Prosthesis & Implant Heart) ×3 IMPLANT
KIT TURNOVER KIT B (KITS) ×3 IMPLANT
LEAD PACING MYOCARDI (MISCELLANEOUS) ×3 IMPLANT
LINE VENT (MISCELLANEOUS) ×3 IMPLANT
LOOP VESSEL SUPERMAXI WHITE (MISCELLANEOUS) ×6 IMPLANT
MARKER GRAFT CORONARY BYPASS (MISCELLANEOUS) IMPLANT
NS IRRIG 1000ML POUR BTL (IV SOLUTION) ×15 IMPLANT
PACK OPEN HEART (CUSTOM PROCEDURE TRAY) ×3 IMPLANT
PAD ARMBOARD 7.5X6 YLW CONV (MISCELLANEOUS) ×6 IMPLANT
PERCLOSE PROGLIDE 6F (VASCULAR PRODUCTS) ×9
PROBE CRYO2-ABLATION MALLABLE (MISCELLANEOUS) ×3 IMPLANT
RING ANNUFLOFLEX SZ 28 (Ring) ×3 IMPLANT
RING TRICUSPID T28 (Prosthesis & Implant Heart) ×3 IMPLANT
SET CARDIOPLEGIA MPS 5001102 (MISCELLANEOUS) ×3 IMPLANT
SET IRRIG TUBING LAPAROSCOPIC (IRRIGATION / IRRIGATOR) ×3 IMPLANT
SET MICROPUNCTURE 5F STIFF (MISCELLANEOUS) ×3 IMPLANT
SHEATH PINNACLE 6F 10CM (SHEATH) ×3 IMPLANT
SHEATH PINNACLE 8F 10CM (SHEATH) ×6 IMPLANT
SUCKER INTRACARDIAC WEIGHTED (SUCKER) ×3 IMPLANT
SUT BONE WAX W31G (SUTURE) ×3 IMPLANT
SUT ETHIBOND (SUTURE) ×12 IMPLANT
SUT ETHIBOND 2 0 SH (SUTURE) ×8 IMPLANT
SUT ETHIBOND 2 0 SH 36X2 (SUTURE) ×4 IMPLANT
SUT ETHIBOND 2 0 V4 (SUTURE) IMPLANT
SUT ETHIBOND 2 0V4 GREEN (SUTURE) IMPLANT
SUT ETHIBOND 2-0 RB-1 WHT (SUTURE) ×12 IMPLANT
SUT ETHIBOND 4 0 TF (SUTURE) IMPLANT
SUT ETHIBOND 5 0 C 1 30 (SUTURE) ×3 IMPLANT
SUT ETHIBOND X763 2 0 SH 1 (SUTURE) ×12 IMPLANT
SUT MNCRL AB 3-0 PS2 18 (SUTURE) ×6 IMPLANT
SUT PDS AB 1 CTX 36 (SUTURE) ×6 IMPLANT
SUT PROLENE 3 0 SH 1 (SUTURE) ×3 IMPLANT
SUT PROLENE 3 0 SH DA (SUTURE) ×9 IMPLANT
SUT PROLENE 3 0 SH1 36 (SUTURE) ×12 IMPLANT
SUT PROLENE 4 0 RB 1 (SUTURE) ×5
SUT PROLENE 4 0 SH DA (SUTURE) ×6 IMPLANT
SUT PROLENE 4-0 RB1 .5 CRCL 36 (SUTURE) ×10 IMPLANT
SUT PROLENE 6 0 C 1 30 (SUTURE) ×3 IMPLANT
SUT PTFE CHORD X 20MM (SUTURE) ×3 IMPLANT
SUT SILK  1 MH (SUTURE) ×4
SUT SILK 1 MH (SUTURE) ×8 IMPLANT
SUT SILK 2 0 SH CR/8 (SUTURE) ×3 IMPLANT
SUT STEEL 6MS V (SUTURE) IMPLANT
SUT STEEL STERNAL CCS#1 18IN (SUTURE) ×3 IMPLANT
SUT STEEL SZ 6 DBL 3X14 BALL (SUTURE) ×6 IMPLANT
SUT VIC AB 2-0 CTX 27 (SUTURE) IMPLANT
SYSTEM SAHARA CHEST DRAIN RE-I (WOUND CARE) ×6 IMPLANT
TAPE CLOTH SOFT 2X10 (GAUZE/BANDAGES/DRESSINGS) ×3 IMPLANT
TAPE CLOTH SURG 4X10 WHT LF (GAUZE/BANDAGES/DRESSINGS) ×3 IMPLANT
TOWEL GREEN STERILE (TOWEL DISPOSABLE) ×3 IMPLANT
TOWEL GREEN STERILE FF (TOWEL DISPOSABLE) ×3 IMPLANT
TRAY FOLEY SLVR 14FR TEMP STAT (SET/KITS/TRAYS/PACK) ×3 IMPLANT
TUBING INSUFFLATION 10FT LAP (TUBING) ×3 IMPLANT
UNDERPAD 30X30 (UNDERPADS AND DIAPERS) ×3 IMPLANT
WATER STERILE IRR 1000ML POUR (IV SOLUTION) ×6 IMPLANT
WIRE EMERALD 3MM-J .035X150CM (WIRE) ×3 IMPLANT

## 2018-01-30 NOTE — Progress Notes (Addendum)
RT assessed pts NIF and VC. Pt NIF was -25 pt VC .4L. PT ABG was pH 7.342, PCO2 34, PaO2 97 and HCO3 18.7.  Pt did not meet criteria per Heart wean protocol to be extubated at this time. RT will restart Wean protocol at 2330 to give pt the opportunity to wake up more. RT will continue to monitor.

## 2018-01-30 NOTE — Progress Notes (Signed)
  Echocardiogram 2D Echocardiogram has been performed.  Jannett Celestine 01/30/2018, 8:54 AM

## 2018-01-30 NOTE — H&P (Signed)
[address]       [place] [address]             [phone]          CARDIOTHORACIC SURGERY HISTORY AND PHYSICAL EXAM  Referring Provider is [name], MD PCP is Leighton Ruff, MD      Chief Complaint  Patient presents with  . New Patient (Initial Visit)    new patient consultation, TEE and Cath review  . Mitral Regurgitation    HPI:  Patient is an 83 year old male status post aortic valve replacement using a bioprosthetic tissue valve in 2012, hypertension, atrial fibrillation on long-term anticoagulation, and previous embolic stroke in 6045 who has been referred for surgical consultation to discuss treatment options for management of mitral valve prolapse with stage D severe symptomatic primary mitral regurgitation and persistent atrial fibrillation.  Patient's cardiac history dates back to 2012 when he underwent aortic valve replacement using a 73mm Edwards Magna Ease stented bovine pericardial tissue valve via right minithoracotomy approach for severe aortic insufficiency.  He recovered uneventfully and was followed for a period of time by Dr. Marlou Porch but subsequently lost to follow-up.  He has been followed since 2018 by Tru[facility] Merle, and previous echocardiograms document the presence of normal functioning bioprosthetic tissue valve in aortic position with mild mitral regurgitation and normal left ventricular systolic function.  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  He was promptly treated with catheter directed therapy and he recovered remarkably well.  Transthoracic echocardiogram performed at that time revealed normal left ventricular function with normal functioning bioprosthetic tissue valve in aortic position and what was felt to be moderate mitral regurgitation.  The patient was notably in atrial fibrillation.  Transesophageal echocardiogram  was not performed but the patient was started on Eliquis for long-term anticoagulation.  He recovered remarkably well from his stroke and has eventually returned to essentially baseline physical status.  He did report decreased exercise tolerance with poor endurance, and he is admitted to some exertional shortness of breath with more strenuous activity.  However, the patient continues to walk every day and he exercises religiously, including doing push-ups and lifting some weights.  Recent follow-up echocardiogram revealed preserved left ventricular function but worsening mitral regurgitation.  He was referred to Dr. Burt Knack for consultation and underwent transesophageal echocardiogram and diagnostic cardiac catheterization on October 14, 2017.  TEE revealed normal left ventricular systolic function with normal functioning bioprosthetic tissue valve in aortic position.  There was severe mitral regurgitation with mitral valve prolapse including a flail segment involving the middle scallop of the posterior leaflet.  There was severe left atrial enlargement.  There was severe tricuspid regurgitation with bileaflet prolapse.  Catheterization revealed mild nonobstructive coronary artery disease with no change in comparison with catheterization performed in 2012.  There were large V waves on wedge tracing consistent with severe mitral regurgitation.  Pulmonary artery pressures were mildly elevated.  Patient is married and lives locally in [place] with his wife.  He has been retired for many years but has remained physically active and quite vigorous throughout his retirement.  He walks daily and exercises religiously.  He does admit to some exertional shortness of breath but this occurs only with strenuous physical exertion.  He denies any history of exertional chest pain or chest tightness.  He has not had dizziness, palpitations, nor syncopal episodes.  His mobility remains quite  good and he reports that he has  essentially recovered completely from his stroke with no significant residual physical limitation with perhaps "very mild" weakness in his left arm.      Patient is an 83 year old malestatus post aortic valve replacement using abioprosthetictissue valve in 2012,hypertension, atrial fibrillation on long-term anticoagulation, and previous embolic stroke in 2330 whoreturns to the office today for follow-up of mitral valve prolapse with severe symptomatic primary mitral regurgitation.  He was originally seen in consultation on October 15, 2017 and he was last seen here in our office on December 16, 2017 at which time we made plans for definitive surgical intervention.  The patient returns to the office today with plans to proceed with mitral valve repair and Maze procedure on Thursday, January 30, 2018.  He reports no new problems or complaints since his last office visit.   Past Medical History:  Diagnosis Date  . Bladder stones   . Borderline diabetes   . BPH (benign prostatic hyperplasia)   . Coronary artery disease    cardiologist-  dr Nat Math gerhart NP--- per cath 06-02-2010 non-obstructive cad pLAD 30-40%  . Diverticulosis of colon   . Dysrhythmia    afib  . Gout   . Heart murmur   . History of adenomatous polyp of colon    2002-- tubular adenoma  . History of aortic insufficiency    severe -- s/p  AVR 08-03-2010  . History of small bowel obstruction    02/ 2007 mechanical sbo s/p  surgical intervention;  partial sbo 09/ 2011 and 03-20-2011 resolved without surgical intervention  . History of urinary retention   . HTN (hypertension)   . Peripheral neuropathy   . S/P aortic valve replacement with prosthetic valve 08/03/2010   tissue valve  . S/P patent foramen ovale closure 08/03/2010   at same time AVR  . Stroke ([place])   . Tricuspid regurgitation     Past Surgical History:  Procedure Laterality Date  . CARDIAC CATHETERIZATION  06-02-2010  dr Marlou Porch    non-obstructive cad- pLAD 30-40%/  normal LVSF/  severe AI  . CARDIOVASCULAR STRESS TEST  04/12/2016   Low risk nuclear perfusion study w/ no significant reversible ischemia/  normal LV function and wall motion ,  stress ef 60%/  50mm inferior and lateral scooped ST-segment depression w/ exercise (may be repolarization abnormality), exercise capacity was moderately reduced  . CATARACT EXTRACTION W/ INTRAOCULAR LENS  IMPLANT, BILATERAL  02/2010  . CYSTOSCOPY WITH LITHOLAPAXY N/A 06/05/2016   Procedure: CYSTOSCOPY WITH LITHOLAPAXY and fulgarization of bladder neck;  Surgeon: Irine Seal, MD;  Location: [facility];  Service: Urology;  Laterality: N/A;  . EXPLORATORY LAPARTOMY /  CHOLECYSTECTOMY  02/28/2005   for Small  bowel obstruction (mechnical)  . IR ANGIO EXTRACRAN SEL COM CAROTID INNOMINATE UNI L MOD SED  10/24/2016  . IR ANGIO VERTEBRAL SEL SUBCLAVIAN INNOMINATE BILAT MOD SED  10/24/2016  . IR PERCUTANEOUS ART THROMBECTOMY/INFUSION INTRACRANIAL INC DIAG ANGIO  10/24/2016  . IR RADIOLOGIST EVAL & MGMT  12/05/2016  . LEFT KNEE ARTHROSCOPY  2006  . RADIOLOGY WITH ANESTHESIA N/A 10/24/2016   Procedure: RADIOLOGY WITH ANESTHESIA;  Surgeon: Luanne Bras, MD;  Location: [facility];  Service: Radiology;  Laterality: N/A;  . RIGHT FOOT SURGERY    . RIGHT MINIATURE ANTERIOR THORACOTOMY FOR AORTIC VALVE REPLACEMENT AND CLOSURE PATENT FORAMEN OVALE  08-03-2010  DR Levada Schilling Magna-ease pericardial tissue valve (57mm)  . RIGHT/LEFT HEART CATH  AND CORONARY ANGIOGRAPHY N/A 10/14/2017   Procedure: RIGHT/LEFT HEART CATH AND CORONARY ANGIOGRAPHY;  Surgeon: [name], MD;  Location: [facility];  Service: Cardiovascular;  Laterality: N/A;  . TEE WITHOUT CARDIOVERSION N/A 10/14/2017   Procedure: TRANSESOPHAGEAL ECHOCARDIOGRAM (TEE);  Surgeon: Josue Hector, MD;  Location: [facility] END[facility];  Service: Cardiovascular;  Laterality: N/A;  . TRANSTHORACIC ECHOCARDIOGRAM  05/30/2016  dr  skains   moderate  LVH ef 60-65%/  bioprothesis aortic valve present ,normal grandient and no AI /  mild MV calcification , moderate MR /  mild PR/ moderate TR/  PASP 3mmHg/ (RA denisty was identified 04-27-2016 echo) and is seen again today, this is likely a promient eustacian ridge, atrium is normal size    Family History  Problem Relation Age of Onset  . Heart disease Mother   . Brain cancer Father     Social History Social History   Tobacco Use  . Smoking status: Never Smoker  . Smokeless tobacco: Never Used  Substance Use Topics  . Alcohol use: Yes    Comment: ONE OR TWO PER MONTH  . Drug use: No    Prior to Admission medications   Medication Sig Start Date End Date Taking? Authorizing Provider  allopurinol (ZYLOPRIM) 300 MG tablet Take 150 mg by mouth daily.   Yes [provider]  amLODipine (NORVASC) 10 MG tablet TAKE 1 TABLET BY MOUTH  DAILY Patient taking differently: Take 10 mg by mouth daily.  12/04/17  Yes Burtis Junes, NP  amoxicillin (AMOXIL) 500 MG capsule Take 2,000 mg by mouth See admin instructions. Take 2000 mg by mouth 1 hour prior to dental appointment   Yes [provider]  atenolol (TENORMIN) 25 MG tablet Take 0.5 tablets (12.5 mg total) by mouth daily. 09/12/17  Yes Burtis Junes, NP  b complex vitamins tablet Take 1 tablet by mouth daily.   Yes [provider]  diphenhydrAMINE (BENADRYL) 2 % cream Apply 1 application topically daily as needed for itching.   Yes [provider]  ELIQUIS 5 MG TABS tablet TAKE 1 TABLET BY MOUTH TWO  TIMES DAILY 12/04/17  Yes Burtis Junes, NP  finasteride (PROSCAR) 5 MG tablet Take 1 tablet (5 mg total) by mouth daily. 11/05/16  Yes Angiulli, Lavon Paganini, PA-C  tamsulosin (FLOMAX) 0.4 MG CAPS capsule Take 2 capsules (0.8 mg total) by mouth daily after breakfast. 11/05/16  Yes Angiulli, Lavon Paganini, PA-C  triamcinolone cream (KENALOG) 0.1 % Apply 1 application topically daily as needed.     [provider]    Allergies  Allergen Reactions  . Sulfa Antibiotics Other (See Comments)    Granulocytosis      Review of Systems:              General:                      normal appetite, decreased energy, no weight gain, no weight loss, no fever             Cardiac:                       no chest pain with exertion, no chest pain at rest, +SOB with strenuous exertion, no resting SOB, no PND, no orthopnea, no palpitations, + arrhythmia, + atrial fibrillation, no LE edema, no dizzy spells, no syncope             Respiratory:  no shortness of breath, no home oxygen, no productive cough, no dry cough, no bronchitis, no wheezing, no hemoptysis, no asthma, no pain with inspiration or cough, no sleep apnea, no CPAP at night             GI:                               no difficulty swallowing, no reflux, no frequent heartburn, no hiatal hernia, no abdominal pain, no constipation, no diarrhea, no hematochezia, no hematemesis, no melena             GU:                              no dysuria,  no frequency, no urinary tract infection, no hematuria, + enlarged prostate, no kidney stones, no kidney disease             Vascular:                     no pain suggestive of claudication, no pain in feet, no leg cramps, no varicose veins, no DVT, no non-healing foot ulcer             Neuro:                         + stroke, no TIA's, no seizures, no headaches, no temporary blindness one eye,  no slurred speech, no peripheral neuropathy, no chronic pain, no instability of gait, no memory/cognitive dysfunction             Musculoskeletal:         no arthritis, no joint swelling, no myalgias, no difficulty walking, normal mobility              Skin:                            + rash, + itching, no skin infections, no pressure sores or ulcerations             Psych:                         no anxiety, no depression, no nervousness, no unusual recent stress             Eyes:                            no blurry vision, no floaters, no recent vision changes, + wears glasses or contacts             ENT:                            no hearing loss, no loose or painful teeth, no dentures, last saw dentist September 2019             Hematologic:               + easy bruising, + abnormal bleeding, no clotting disorder, no frequent epistaxis             Endocrine:                   no diabetes, does not  check CBG's at home                                                       Physical Exam:              BP 140/88 (BP Location: Left Arm, Patient Position: Sitting, Cuff Size: Normal)   Pulse 70   Resp 18   Ht 5\' 11"  (1.803 m)   Wt 162 lb 12.8 oz (73.8 kg)   SpO2 97% Comment: RA  BMI 22.71 kg/m              General:                      Thin,  well-appearing, appears younger than stated age             34:                       Unremarkable              Neck:                           no JVD, no bruits, no adenopathy              Chest:                          clear to auscultation, symmetrical breath sounds, no wheezes, no rhonchi              CV:                              Irregular rate and rhythm, grade III/VI holosystolic murmur heard best at apex,  no diastolic murmur             Abdomen:                    soft, non-tender, no masses              Extremities:                 warm, well-perfused, pulses palpable, no LE edema             Rectal/GU                   Deferred             Neuro:                         Grossly non-focal and symmetrical throughout             Skin:                            Clean and dry, no rashes, no breakdown   Diagnostic Tests:  Transthoracic Echocardiography  Patient: Nael, Petrosyan MR #: [id number] Study Date: 09/06/2017 Gender: M Age: 64 Height: 181.6 cm Weight: 76.1 kg BSA: 1.96 m^2 Pt. Status: Room:  ATTENDING Fransico Him, MD SONOGRAPHER Diamond Nickel REFERRING Leighton Ruff [id number] [address], [place]  C PERFORMING [facility], Outpatient  cc:  ------------------------------------------------------------------- LV EF: 60% - 65%  ------------------------------------------------------------------- Indications: I05.9 Mitral valve disorder. I34.0 Mitral valve insufficiency. I07.1 Tricuspid valve insufficiency.  ------------------------------------------------------------------- History: PMH: Aortic valve replacement. PFO repair. Stroke. Risk factors: Diabetes mellitus.  ------------------------------------------------------------------- Study Conclusions  - Left ventricle: The cavity size was normal. There was severe concentric hypertrophy. Systolic function was normal. The estimated ejection fraction was in the range of 60% to 65%. Wall motion was normal; there were no regional wall motion abnormalities. The study was not technically sufficient to allow evaluation of LV diastolic dysfunction due to atrial fibrillation. - Aortic valve: A bioprosthesis was present and functioning normally. Valve area (VTI): 1.39 cm^2. Valve area (Vmax): 1.4 cm^2. Valve area (Vmean): 1.41 cm^2. - Mitral valve: Calcified annulus. There was moderate to severe regurgitation directed eccentrically, toward the septum, and along the left atrial wall. - Left atrium: The atrium was severely dilated. - Right atrium: The atrium was severely dilated. - Tricuspid valve: There was moderate regurgitation. - Pulmonary arteries: PA peak pressure: 51 mm Hg (S). - Line: A venous catheter was visualized in the superior vena cava, with its tip in the right atrium. No abnormal features noted.  Impressions:  - Normal LV size and function with severe LVH. Cannot assess diastolic function due to afib. Stable aortic valve bioprosthesis with mean AVG 14mmHg. No AR noted.  Moderate pulmonary HTN. There is moderate to severe MR eccentrically directed towards the septum which appears to have progressed from prior echo. Consider TEE for further evaulation. The right ventricular systolic pressure was increased consistent with moderate pulmonary hypertension.  ------------------------------------------------------------------- Study data: Comparison was made to the study of 10/27/2016. Study status: Routine. Procedure: The patient reported no pain pre or post test. Transthoracic echocardiography. Image quality was adequate. Study completion: There were no complications. Transthoracic echocardiography. M-mode, complete 2D, spectral Doppler, and color Doppler. Birthdate: Patient birthdate: [birth year]. Age: Patient is 83 yr old. Sex: Gender: male. BMI: 23.1 kg/m^2. Blood pressure: 123/81 Patient status: Outpatient. Study date: Study date: 09/06/2017. Study time: 07:34 AM. Location: [facility]  -------------------------------------------------------------------  ------------------------------------------------------------------- Left ventricle: The cavity size was normal. There was severe concentric hypertrophy. Systolic function was normal. The estimated ejection fraction was in the range of 60% to 65%. Wall motion was normal; there were no regional wall motion abnormalities. Normal sinus rhythm was absent. The study was not technically sufficient to allow evaluation of LV diastolic dysfunction due to atrial fibrillation.  ------------------------------------------------------------------- Aortic valve: Normal thickness leaflets. A bioprosthesis was present and functioning normally. Mobility was not restricted. Doppler: Transvalvular velocity was within the normal range. There was no stenosis. There was no regurgitation. VTI ratio of LVOT to aortic valve: 0.44. Valve area (VTI): 1.39 cm^2. Indexed  valve area (VTI): 0.71 cm^2/m^2. Peak velocity ratio of LVOT to aortic valve: 0.45. Valve area (Vmax): 1.4 cm^2. Indexed valve area (Vmax): 0.71 cm^2/m^2. Mean velocity ratio of LVOT to aortic valve: 0.45. Valve area (Vmean): 1.41 cm^2. Indexed valve area (Vmean): 0.72 cm^2/m^2. Mean gradient (S): 9 mm Hg. Peak gradient (S): 20 mm Hg.  ------------------------------------------------------------------- Aorta: Aortic root: The aortic root was normal in size.  ------------------------------------------------------------------- Mitral valve: Calcified annulus. Mobility was not restricted. Doppler: Transvalvular velocity was within the normal range. There was no evidence for stenosis. There was moderate to severe regurgitation directed eccentrically, toward the septum, and along the left atrial wall. Valve area by pressure half-time: 5.37 cm^2. Indexed valve area by pressure half-time: 2.74 cm^2/m^2. Peak  gradient (D): 4 mm Hg.  ------------------------------------------------------------------- Left atrium: The atrium was severely dilated.  ------------------------------------------------------------------- Right ventricle: The cavity size was normal. Wall thickness was normal. Systolic function was normal.  ------------------------------------------------------------------- Pulmonic valve: Structurally normal valve. Cusp separation was normal. Doppler: Transvalvular velocity was within the normal range. There was no evidence for stenosis. There was mild to moderate regurgitation.  ------------------------------------------------------------------- Tricuspid valve: Structurally normal valve. Doppler: Transvalvular velocity was within the normal range. There was moderate regurgitation.  ------------------------------------------------------------------- Pulmonary artery: The main pulmonary artery was normal-sized. Systolic pressure was within the  normal range.  ------------------------------------------------------------------- Right atrium: The atrium was severely dilated.  ------------------------------------------------------------------- Pericardium: There was no pericardial effusion.  ------------------------------------------------------------------- Systemic veins: Line: A venous catheter was visualized in the superior vena cava, with its tip in the right atrium. No abnormal features noted. Inferior vena cava: The vessel was dilated. The respirophasic diameter changes were blunted (< 50%), consistent with elevated central venous pressure.  ------------------------------------------------------------------- Measurements  Left ventricle Value Reference LV ID, ED, PLAX chordal (L) 38 mm 43 - 52 LV ID, ES, PLAX chordal (L) 22 mm 23 - 38 LV fx shortening, PLAX chordal 42 % >=29 LV PW thickness, ED 17 mm ---------- IVS/LV PW ratio, ED 1 <=1.3 Stroke volume, 2D 62 ml ---------- Stroke volume/bsa, 2D 32 ml/m^2 ---------- LV e&', lateral 18.5 cm/s ---------- LV E/e&', lateral 5.36 ---------- LV e&', medial 8.98 cm/s ---------- LV E/e&', medial 11.05 ---------- LV e&', average 13.74 cm/s ---------- LV E/e&', average 7.22 ----------  Ventricular septum Value Reference IVS thickness, ED 17 mm ----------  LVOT Value Reference LVOT ID, S 20 mm  ---------- LVOT area 3.14 cm^2 ---------- LVOT peak velocity, S 101 cm/s ---------- LVOT mean velocity, S 60.2 cm/s ---------- LVOT VTI, S 19.9 cm ----------  Aortic valve Value Reference Aortic valve peak velocity, S 226 cm/s ---------- Aortic valve mean velocity, S 134 cm/s ---------- Aortic valve VTI, S 44.8 cm ---------- Aortic mean gradient, S 9 mm Hg ---------- Aortic peak gradient, S 20 mm Hg ---------- VTI ratio, LVOT/AV 0.44 ---------- Aortic valve area, VTI 1.39 cm^2 ---------- Aortic valve area/bsa, VTI 0.71 cm^2/m^2 ---------- Velocity ratio, peak, LVOT/AV 0.45 ---------- Aortic valve area, peak velocity 1.4 cm^2 ---------- Aortic valve area/bsa, peak 0.71 cm^2/m^2 ---------- velocity Velocity ratio, mean, LVOT/AV 0.45 ---------- Aortic valve area, mean velocity 1.41 cm^2 ---------- Aortic valve area/bsa, mean 0.72 cm^2/m^2 ---------- velocity  Left atrium Value Reference LA ID, A-P, ES 53 mm ---------- LA ID/bsa, A-P (H) 2.7 cm/m^2 <=2.2 LA volume, S 146 ml ---------- LA volume/bsa, S 74.5 ml/m^2 ---------- LA volume, ES, 1-p A4C 138 ml ---------- LA volume/bsa, ES, 1-p A4C 70.4 ml/m^2 ---------- LA volume, ES, 1-p A2C 151 ml ---------- LA volume/bsa, ES, 1-p A2C 77  ml/m^2 ----------  Mitral valve Value Reference Mitral E-wave peak velocity 99.2 cm/s ---------- Mitral A-wave peak velocity 30.6 cm/s ---------- Mitral deceleration time (L) 141 ms 150 - 230 Mitral pressure half-time 41 ms ---------- Mitral peak gradient, D 4 mm Hg ---------- Mitral E/A ratio, peak 3.2 ---------- Mitral valve area, PHT, DP 5.37 cm^2 ---------- Mitral valve area/bsa, PHT, DP 2.74 cm^2/m^2 ---------- Aliasing velocity, MR PISA 35.2 cm/s ---------- Mitral regurg PISA radius 8 mm ---------- Mitral regurg VTI, PISA 186 cm ---------- Mitral ERO, PISA 0.26 cm^2 ---------- Mitral regurg volume, PISA 48 ml ----------  Pulmonary arteries Value Reference PA pressure, S, DP (H) 51 mm Hg <=30  Tricuspid valve Value Reference Tricuspid regurg peak velocity 299 cm/s ----------  Tricuspid peak RV-RA gradient 36 mm Hg ----------  Right atrium Value Reference RA ID, S-I, ES, A4C (H) 74.6 mm 34 - 49 RA area, ES, A4C (H) 29.7 cm^2 8.3 - 19.5 RA volume, ES, A/L 97.1 ml ---------- RA volume/bsa, ES, A/L 49.5 ml/m^2 ----------  Systemic veins Value Reference Estimated CVP 15 mm Hg ----------  Right ventricle Value Reference RV pressure, S, DP (H) 51 mm Hg  <=30 RV s&', lateral, S 10.7 cm/s ----------  Pulmonic valve Value Reference Pulmonic regurg velocity, ED 114 cm/s ---------- Pulmonic regurg gradient, ED 5 mm Hg ----------  Legend: (L) and (H) mark values outside specified reference range.  ------------------------------------------------------------------- Prepared and Electronically Authenticated by  Fransico Him, MD 2019-08-30T09:17:11     Transesophageal Echocardiography  Patient: Shaquill, Iseman MR #: [id number] Study Date: 10/14/2017 Gender: M Age: 66 Height: 180.3 cm Weight: 75.7 kg BSA: 1.95 m^2 Pt. Status: Room:  ADMITTING Jenkins Rouge, M.D. PERFORMING Jenkins Rouge, M.D. SONOGRAPHER Dustin Flock, [place] ATTENDING [name], MD ORDERING [name], MD REFERRING [name], MD  cc:  ------------------------------------------------------------------- LV EF: 60% - 65%  ------------------------------------------------------------------- Indications: Mitral regurgitation 424.0.  ------------------------------------------------------------------- History: PMH: Atrial fibrillation. Aortic valve disease. Mitral valve disease. Stroke. Risk factors: Hypertension.  ------------------------------------------------------------------- Study Conclusions  - Left ventricle: Hypertrophy was noted. Systolic function was normal. The estimated ejection fraction was in the range of 60% to 65%. - Aortic valve: Normal appearing bioprosthetic AVR with mean gradient 9 mmHg no peri valvular regurgitation. - Mitral valve: Severe grade 3-4 MR eccentric anterior due to prolapse of medial aspect of P2 scallop. - Left atrium: The atrium was dilated. No evidence of thrombus in the atrial cavity or  appendage. - Right ventricle: The cavity size was dilated. - Right atrium: The atrium was dilated. 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 - Tricuspid valve: Bileaflet prolapse with severe TR. There was severe regurgitation.  ------------------------------------------------------------------- Study data: Study status: Routine. Consent: The risks, benefits, and alternatives to the procedure were explained to the patient and informed consent was obtained. Procedure: The patient reported no pain pre or post test. Initial setup. The patient was brought to the laboratory. Surface ECG leads were monitored. Sedation. Conscious sedation was administered by cardiology staff. Transesophageal echocardiography. Topical anesthesia was obtained using viscous lidocaine. A transesophageal probe was inserted by the attending cardiologistwithout difficulty. Image quality was adequate. Study completion: The patient tolerated the procedure well. There were no complications. Administered medications: Midazolam, 4mg , IV. Fentanyl, 33mcg, IV. Diagnostic transesophageal echocardiography. 2D and color Doppler. Birthdate: Patient birthdate: May 28, 1935. Age: Patient is 83 yr old. Sex: Gender: male. BMI: 23.3 kg/m^2. Blood pressure: 153/84 Patient status: Inpatient. Study date: Study date: 10/14/2017. Study time: 07:30 AM. Location: Endoscopy.  -------------------------------------------------------------------  ------------------------------------------------------------------- Left ventricle: Hypertrophy was noted. Systolic function was normal. The estimated ejection fraction was in the range of 60% to 65%.  ------------------------------------------------------------------- Aortic valve: Normal appearing bioprosthetic AVR with mean gradient 9 mmHg no peri  valvular regurgitation. Doppler: Mean gradient (S): 9 mm Hg. Peak gradient (S): 19 mm Hg.  ------------------------------------------------------------------- Mitral valve: Severe grade 3-4 MR eccentric anterior due to prolapse of medial aspect of P2 scallop. Doppler: Mean gradient (D): 40 mm Hg.  ------------------------------------------------------------------- Left atrium: The atrium was dilated. No evidence of thrombus in the atrial cavity or appendage.  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  ------------------------------------------------------------------- Right ventricle: The cavity size was dilated.  ------------------------------------------------------------------- Pulmonic valve: Doppler: There was mild regurgitation.  ------------------------------------------------------------------- Tricuspid valve: Bileaflet prolapse with severe TR. Doppler: There was severe regurgitation.  ------------------------------------------------------------------- Right atrium: The atrium was dilated.  ------------------------------------------------------------------- Pericardium: The pericardium was normal in appearance. There was no pericardial effusion.  ------------------------------------------------------------------- Measurements  Aortic valve Value Aortic valve peak velocity, S 220 cm/s Aortic valve mean velocity, S 138 cm/s Aortic valve VTI, S 47.1 cm Aortic mean gradient, S 9 mm Hg Aortic peak gradient, S 19 mm Hg  Mitral valve Value Mitral mean velocity, D 240 cm/s Mitral mean gradient, D 40 mm Hg Mitral annulus VTI, D  101 cm  Legend: (L) and (H) mark values outside specified reference range.  ------------------------------------------------------------------- Prepared and Electronically Authenticated by  Jenkins Rouge, M.D. 2019-10-07T09:01:26   RIGHT/LEFT HEART CATH AND CORONARY ANGIOGRAPHY  Conclusion     Prox Cx to Mid Cx lesion is 30% stenosed.  Ost LAD to Prox LAD lesion is 30% stenosed.  Prox LAD to Mid LAD lesion is 40% stenosed.  1. Nonobstructive coronary artery disease with no significant change compared to 2012 cardiac catheterization study 2. Large V waves in the pulmonary capillary wedge tracing consistent with severe mitral insufficiency  Recommend: referral to cardiac surgery for further evaluation of treatment options (surgical mitral valve repair + Maze versus percutaneous mitral valve repair)  Resume apixaban tomorrow morning   Indications   Severe mitral insufficiency [I34.0 (ICD-10-CM)]  Procedural Details/Technique   Technical Details INDICATION: Severe mitral insufficiency. The patient is an 83 year old male with previous aortic valve replacement from a minithoracotomy approach in 2012. He has developed progressive mitral insufficiency with associated symptoms of exertional dyspnea and fatigue. He is referred for right and left heart catheterization to evaluate further treatment options for mitral insufficiency.  PROCEDURAL DETAILS: The right wrist was prepped, draped, and anesthetized with 1% lidocaine. Using the modified Seldinger technique, a 5/6 French Slender sheath was introduced into the right radial artery. 3 mg of verapamil was administered through the sheath, weight-based unfractionated heparin was administered intravenously. Standard Judkins catheters were used for selective coronary angiography. Catheter exchanges were performed over an exchange length guidewire. For the right heart catheterization, we were unable to obtain  adequate IV access in the arm. Ultrasound guidance was utilized to access the right femoral vein via a front wall puncture. Ultrasound images are captured and stored in the patient's chart. An 8 French sheath is inserted. A Swan-Ganz catheter is used per protocol. Pressure measurements and oxygen saturations are recorded and Fick cardiac output is calculated. There were no immediate procedural complications. A TR band was used for radial hemostasis at the completion of the procedure. The patient was transferred to the post catheterization recovery area for further monitoring.     Estimated blood loss <50 mL.  During this procedure the patient was administered the following to achieve and maintain moderate conscious sedation: Versed 1 mg, Fentanyl 25 mcg, while the patient's heart rate, blood pressure, and oxygen saturation were continuously monitored. The period of conscious sedation was 32 minutes, of which I was present face-to-face 100% of this time.  Coronary Findings   Diagnostic  Dominance: Right  Left Main  There is mild diffuse disease throughout the vessel.  Left Anterior Descending  Ost LAD to Prox LAD lesion 30% stenosed  Ost LAD to Prox LAD lesion is 30% stenosed.  Prox LAD to Mid LAD lesion 40% stenosed  Prox LAD to Mid  LAD lesion is 40% stenosed. The LAD is patent throughout. There is mild diffuse stenosis of the proximal vessel without significant obstruction. There is mild stenosis of the mid vessel without high-grade obstruction.  Left Circumflex  Prox Cx to Mid Cx lesion 30% stenosed  Prox Cx to Mid Cx lesion is 30% stenosed.  Right Coronary Artery  Vessel is large. There is mild diffuse disease throughout the vessel. The vessel is ectatic. The RCA is a large, dominant vessel. The vessel has mild diffuse irregularity with no significant stenosis. There is mild ectasia through the midportion of the RCA. The PDA is a large branch with no significant stenosis.  Intervention    No interventions have been documented.  Left Heart   Aortic Valve The patient has a bioprosthetic aortic valve .  Coronary Diagrams   Diagnostic Diagram       Implants       No implant documentation for this case.  MERGE Images   Show images for CARDIAC CATHETERIZATION   Link to Procedure Log   Procedure Log    Hemo Data    Most Recent Value  Fick Cardiac Output 4.69 L/min  Fick Cardiac Output Index 2.4 (L/min)/BSA  RA A Wave -99 mmHg  RA V Wave 11 mmHg  RA Mean 6 mmHg  RV Systolic Pressure 34 mmHg  RV Diastolic Pressure -2 mmHg  RV EDP 6 mmHg  PA Systolic Pressure 32 mmHg  PA Diastolic Pressure 10 mmHg  PA Mean 20 mmHg  PW A Wave -99 mmHg  PW V Wave 27 mmHg  PW Mean 17 mmHg  AO Systolic Pressure 976 mmHg  AO Diastolic Pressure 52 mmHg  AO Mean 71 mmHg  QP/QS 1  TPVR Index 8.34 HRUI  TSVR Index 29.61 HRUI  PVR SVR Ratio 0.05  TPVR/TSVR Ratio 0.28     CT ANGIOGRAPHY CHEST, ABDOMEN AND PELVIS  TECHNIQUE: Multidetector CT imaging through the chest, abdomen and pelvis was performed using the standard protocol during bolus administration of intravenous contrast. Multiplanar reconstructed images and MIPs were obtained and reviewed to evaluate the vascular anatomy.  CONTRAST: 161mL ISOVUE-370 IOPAMIDOL (ISOVUE-370) INJECTION 76%  COMPARISON: 05/08/2017, 05/11/2016, 09/18/2009  FINDINGS: CTA CHEST FINDINGS  Cardiovascular:  Heart:  No pericardial fluid/thickening. Calcifications of left main, left anterior descending, right coronary arteries. Surgical changes of mini thoracotomy and aortic valve repair.  Aorta:  Unremarkable course, caliber, contour of the thoracic aorta. No aneurysm or dissection flap. No periaortic fluid. Branch vessels patent without significant atherosclerotic changes. No significant atherosclerotic changes of the descending thoracic aorta  Pulmonary arteries:  No central, lobar, segmental, or  proximal subsegmental filling defects.  Mediastinum/Nodes: No mediastinal adenopathy. Unremarkable appearance of the thoracic esophagus.  Unremarkable appearance of the thoracic inlet and thyroid.  Lungs/Pleura: Central airways are clear. No pleural effusion. No confluent airspace disease.  No pneumothorax. Minimal paraseptal emphysema at the right lung apex.  Musculoskeletal: No acute displaced fracture. Degenerative changes of the spine.  CTA ABDOMEN AND PELVIS FINDINGS  VASCULAR  Aorta: Minimal atherosclerosis of the abdominal aorta. No aneurysm. No periaortic fluid. No dissection.  Celiac: Minimal atherosclerotic changes at the celiac artery origin.  SMA: SMA is patent with minimal atherosclerotic changes at the origin.  Renals: Minimal atherosclerotic changes at the origin of the bilateral renal arteries which remain patent.  IMA: IMA patent.  Right lower extremity:  Right common iliac artery measures as large as 16 mm. No dissection. Minimal atherosclerotic changes. Hypogastric arteries patent. Anterior and  posterior division patent. Common femoral artery patent. Proximal SFA and profunda femoris patent. Surgical changes overlying the right common femoral artery/proximal SFA.  Left lower extremity:  Left common iliac artery measures as large as 16 mm. No dissection. Minimal atherosclerotic changes. Hypogastric arteries patent. Anterior and posterior division patent. Common femoral artery patent. Proximal SFA and profunda femoris patent.  Veins: Unremarkable appearance of the venous system.  Review of the MIP images confirms the above findings.  NON-VASCULAR  Hepatobiliary: Unremarkable appearance of the liver. Cholecystectomy  Pancreas: Unremarkable pancreas  Spleen: Unremarkable.  Adrenals/Urinary Tract: Unremarkable appearance of adrenal glands.  Right:  No evidence of hydronephrosis. Cystic lesion of the inferior  right kidney cortex, compatible with Bosniak 1 cyst. Low-density lesion at the superior cortex of the right kidney, most likely benign cyst. Redemonstration of nonobstructive stones of the right collecting system. Unremarkable course the right ureter.  Left:  No left-sided hydronephrosis. No inflammatory changes. Unremarkable course of the left ureter.  Unremarkable appearance of the urinary bladder.  There are small bilateral low-density lesions which are too small to characterize.  Stomach/Bowel: Unremarkable appearance of the stomach. Unremarkable appearance of small bowel. No evidence of obstruction. Normal appendix. Colonic diverticular disease without evidence of acute diverticulitis.  Lymphatic: No lymphadenopathy.  Mesenteric: No free fluid or air. No adenopathy.  Reproductive: Transverse diameter of the prostate measures 5.4 cm.  Other: No hernia.  Musculoskeletal: No acute fracture. There is lucent lesion of the L1 vertebral body, which replaces the expected trabecula. This is clearly new when compared to the CT of 09/18/2009, though does not appear to have changed since the prior CT of 05/08/2017. Questionable inferior plate erosion and involvement of the superior endplate of L2. No vertebral body height loss or fracture line.  Multilevel degenerative changes of the thoracic and lumbar spine. No bony canal narrowing. Degenerative changes of the hips.  Review of the MIP images confirms the above findings.  IMPRESSION: Surgical changes of prior mini thoracotomy and aortic valve replacement.  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  There is lucent lesion of the L1 vertebral body, with replacement of the vertebral body trabecula and erosion of the inferior L1 endplate, new from the CT of 09/18/2009. Further evaluation with contrast-enhanced lumbar  spine MRI is recommended, as metastatic disease cannot be excluded.  Nonobstructive right-sided nephrolithiasis.  Signed,  Dulcy Fanny. Dellia Nims, RPVI  [facility]  [facility]   Electronically Signed By: Corrie Mckusick D.O. On: 10/21/2017 21:04   MRI LUMBAR SPINE WITHOUT AND WITH CONTRAST  TECHNIQUE: Multiplanar and multiecho pulse sequences of the lumbar spine were obtained without and with intravenous contrast.  CONTRAST: 7 cc Gadavist  COMPARISON: 10/21/2017, 05/08/2017, 05/11/2016 and 09/18/2009 CT  FINDINGS: Segmentation: Last fully open disc space L5-S1. Current examination incorporates from T11-12 disc space through the upper S2 level.  Alignment: Mild curvature lumbar spine convex left.  Vertebrae: Fusion L1-2 disc space. No abnormality noted involving the L1 vertebral body as questioned on recent CT. 9 mm nonspecific rounded lesion left L3 vertebral body pedicle junction. No other bony abnormality noted.  Conus medullaris and cauda equina: Conus extends to the lower T12 level. Conus and cauda equina appear normal.  Paraspinal and other soft tissues: Partially visualize right renal cystic structures better delineated on CT. Atherosclerotic changes aorta.  Disc levels:  T11-12: Small Schmorl's node deformity. No spinal stenosis or foraminal narrowing.  T12-L1: No spinal stenosis or foraminal narrowing.  L1-2: Fusion. No spinal stenosis or foraminal narrowing.  L2-3: Disc degeneration. Small Schmorl's node deformity superior endplate L3. Anterior osteophyte. Minimal bulge. No significant spinal stenosis or foraminal narrowing.  L3-4: Small Schmorl's node deformity. Minimal bulge. Mild facet degenerative changes. No significant spinal stenosis or foraminal narrowing.  L4-5: Facet bony overgrowth slightly greater on left. Minimal indentation left lateral thecal sac.  Minimal bulge. No significant spinal stenosis or foraminal narrowing.  L5-S1: Facet degenerative change greater on the right. No significant spinal stenosis or foraminal narrowing.  IMPRESSION: 1. No abnormality involving the L1 vertebral body as questioned on recent CT. Fusion of the L1-2 disc space contributes to the slightly indistinct appearance of the endplates as noted on recent CT. 2. 9 mm nonspecific rounded lesion left L3 vertebral body-pedicle junction (series 8, image 12 and series 5, image 22). No other bony abnormality noted. Etiology of this L3 bony lesion is indeterminate. If the patient had a known malignancy, bone scan at the present time may than be considered to determine if there are any other osseous abnormalities. If the patient does not have a known malignancy, than stability of this finding can be confirmed on follow-up MR in 6 months. 3. Scattered degenerative changes without significant spinal stenosis or foraminal narrowing as detailed above.   Electronically Signed By: Genia Del M.D. On: 10/28/2017 08:57     Impression:  Patient has mitral valve prolapse with stage D severe symptomatic primary mitral regurgitation and persistent atrial fibrillation dating back at least one year at which time the patient presented with an acute embolic stroke. He currently describes stable symptoms of mild exertional shortness of breath and fatigue consistent with chronic diastolic congestive heart failure, [place] Heart Association functional class I-II. I have personally reviewed the patient's recent transthoracic and transesophageal echocardiograms as well as his diagnostic cardiac catheterization. Echocardiograms demonstrate normal left ventricular systolic function with normal bioprosthetic tissue valve in aortic position. There is mitral valve prolapse with an obvious flail segment involving the middle scallop of the posterior leaflet of the mitral  valve. The mitral annulus is dilated. There is type II mitral valve dysfunction with severe mitral regurgitation. There is severe left atrial enlargement. No clot was seen in the left atrial appendage. There is severe tricuspid regurgitation. Diagnostic cardiac catheterization is notable for the absence of significant coronary artery disease.   Plan:  I have again reviewed the indications, risk, and potential benefits of mitral valve repair, Maze procedure and possible tricuspid valve repair with the patient and his wife in the office today.  The rationale for elective surgery has been explained, including a comparison between surgery and continued medical therapy with close follow-up.  The likelihood of successful and durable valve repair has been discussed with particular reference to the findings of their recent echocardiogram.  Based upon these findings and previous experience, I have quoted them a greater than 90 percent likelihood of successful valve repair.  In the unlikely event that his valve cannot be successfully repaired, we discussed the possibility of replacing the mitral valve using a mechanical prosthesis with the attendant need for long-term anticoagulation versus the alternative of replacing it using a bioprosthetic tissue valve with its potential for late structural valve deterioration and failure, depending upon the patient's longevity.  The patient specifically requests that if the mitral valve must be replaced that it be done using a bioprosthetic tissue valve.   The relative risks and benefits of  performing a maze procedure at the time of their surgery was discussed at length, including the expected likelihood of long term freedom from recurrent symptomatic atrial fibrillation and/or atrial flutter.  The patient understands and accepts all potential risks of surgery including but not limited to risk of death, stroke or other neurologic complication, myocardial infarction,  congestive heart failure, respiratory failure, renal failure, bleeding requiring transfusion and/or reexploration, arrhythmia, infection or other wound complications, pneumonia, pleural and/or pericardial effusion, pulmonary embolus, aortic dissection or other major vascular complication, or delayed complications related to valve repair or replacement including but not limited to structural valve deterioration and failure, thrombosis, embolization, endocarditis, or paravalvular leak.   The patient additionally provides consent for long term follow up following surgery including participation in the [place].  All of their questions have been answered.     [name]. Roxy Manns, MD 01/20/2018 3:59 PM

## 2018-01-30 NOTE — Anesthesia Procedure Notes (Signed)
Arterial Line Insertion Start/End1/23/2020 6:55 AM, 01/30/2018 7:00 AM Performed by: Barrington Ellison, CRNA, CRNA  Patient location: Pre-op. Preanesthetic checklist: patient identified Lidocaine 1% used for infiltration and patient sedated Left, radial was placed Catheter size: 20 G Hand hygiene performed  and maximum sterile barriers used  Allen's test indicative of satisfactory collateral circulation Attempts: 1 Procedure performed without using ultrasound guided technique. Following insertion, dressing applied and Biopatch. Post procedure assessment: normal  Patient tolerated the procedure well with no immediate complications.

## 2018-01-30 NOTE — Transfer of Care (Signed)
Immediate Anesthesia Transfer of Care Note  Patient: Olie Dibert.  Procedure(s) Performed: STERNOTOMY (N/A Chest) MITRAL VALVE REPAIR (MVR) USING CARBOMEDICS ANNULOFLEX SIZE 28 (N/A Chest) MAZE (N/A ) CLIPPING OF LEFT ATRIAL APPENDAGE USING ATRICLIP PRO2 45MM (N/A Chest) TRICUSPID VALVE REPAIR USING MC3 SIZE 28 (N/A Chest)  Patient Location: ICU  Anesthesia Type:General  Level of Consciousness: Patient remains intubated per anesthesia plan  Airway & Oxygen Therapy: Patient remains intubated per anesthesia plan and Patient placed on Ventilator (see vital sign flow sheet for setting)  Post-op Assessment: Report given to RN  Post vital signs: Reviewed and stable  Last Vitals:  Vitals Value Taken Time  BP    Temp    Pulse    Resp    SpO2      Last Pain:  Vitals:   01/30/18 0604  TempSrc:   PainSc: 0-No pain      Patients Stated Pain Goal: 3 (09/81/19 1478)  Complications: No apparent anesthesia complications

## 2018-01-30 NOTE — Op Note (Signed)
CARDIOTHORACIC SURGERY OPERATIVE NOTE  Date of Procedure:  01/30/2018  Preoperative Diagnosis:   Severe Mitral Regurgitation  Severe Tricuspid Regurgitation  Recurrent Persistent Atrial Fibrillation  Postoperative Diagnosis: Same  Procedure:   Mitral Valve Repair  Redo median sternotomy  Percutaneous arterial and venous cannulation  Complex mitral valvuloplasty including artificial Gore-tex neochord placement x4  Sorin Carbomedics Annuloflex ring annuloplasty (size 1mm, catalog # [id number], serial # [id number])   Tricuspid Valve Repair  Edwards mc3 ring annuloplasty (size 70mm, model # [id number], serial # [id number])   Maze Procedure   complete bilateral atrial lesion set using bipolar radiofrequency and cryothermy ablation  clipping of left atrial appendage (Atricure Pro245 left atrial clip, size 79mm)  Removal of large, anomalous muscle band traversing the right atrium    Surgeon: [name]. 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  Type II mitral valve dysfunction with severe mitral regurgitation  Normal left ventricular systolic function  Type I tricuspid valve dysfunction with severe tricuspid regurgiation  Dilated right ventricle with normal right ventricular function  No residual mitral regurgitation after successful valve repair  Mild residual tricuspid regurgitation after successful valve repair  Large anomalous right atrial muscle band                                  BRIEF CLINICAL NOTE AND INDICATIONS FOR SURGERY  Patient is an 83 year old male status post aortic valve replacement using abioprosthetictissue valve in 2012,hypertension, atrial fibrillation on long-term anticoagulation, and previous embolic stroke in 0347 who has been referred for surgical consultation  to discuss treatment options for management of mitral valve prolapse with stage D severe symptomatic primary mitral regurgitation and persistent atrial fibrillation.  Patient's cardiac history dates back to 2012 when he underwent aortic valve replacement using a91mm Edwards Magna Easestented bovine pericardial tissue valve via right minithoracotomy approach for severe aortic insufficiency. He recovered uneventfully and was followed for a period of time by Dr. Marlou Porch but subsequently lost to follow-up. He has been followed since 2018 by Truitt Merle, and previous echocardiograms document the presence of normal functioning bioprosthetic tissue valve in aortic position with mild mitral regurgitation and normal left ventricular systolic function. 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 He was promptly treated with catheter directed therapy and he recovered remarkably well. Transthoracic echocardiogram performed at that time revealed normal left ventricular function with normal functioning bioprosthetic tissue valve in aortic position and what was felt to be moderate mitral regurgitation. The patient was notably in atrial fibrillation. Transesophageal echocardiogram was not performed but the patient was started on Eliquis for long-term anticoagulation. He recovered remarkably well from his stroke and has eventually returned to essentially baseline physical status.He did report decreased exercise tolerance with poor endurance, and he is admitted to some exertional shortness of breath with more strenuous activity. However, the patient continues to walk every day and he exercises religiously, including doing push-ups and lifting some weights. Recent follow-up echocardiogram revealed preserved left ventricular function but worsening mitral regurgitation. He was referred to Dr. Burt Ayers for consultation and underwent  transesophageal echocardiogram and diagnostic cardiac catheterization on October 14, 2017. TEE revealed normal left ventricular systolic function with normal functioning bioprosthetic tissue valve in aortic position. There was severe mitral regurgitation  with mitral valve prolapse including a flail segment involving the middle scallop of the posterior leaflet. There was severe left atrial enlargement. There was severe tricuspid regurgitation with bileaflet prolapse. Catheterization revealed mild nonobstructive coronary artery disease with no change in comparison with catheterization performed in 2012. There were large V waves on wedge tracing consistent with severe mitral regurgitation. Pulmonary artery pressures were mildly elevated.  The patient has been seen in consultation and counseled at length regarding the indications, risks and potential benefits of surgery.  All questions have been answered, and the patient provides full informed consent for the operation as described.   DETAILS OF THE OPERATIVE PROCEDURE  Preparation:  The patient is brought to the operating room on the above mentioned date and central monitoring was established by the anesthesia team including placement of Swan-Ganz catheter and radial arterial line. The patient is placed in the supine position on the operating table.  Intravenous antibiotics are administered. General endotracheal anesthesia is induced uneventfully. A Foley catheter is placed.  Baseline transesophageal echocardiogram was performed.  Findings were notable for normal left ventricular systolic function with severe mitral regurgitation.  The jet of regurgitation was very eccentric and directed anteriorly.  There appeared to be ruptured chordae tendinae.  There was a bioprosthetic tissue valve in the aortic position that was functioning normally.  There was no aortic insufficiency.  There was normal right ventricular function.  The right ventricle was dilated.   The tricuspid annulus was dilated.  There was severe tricuspid regurgitation.  The patient's chest, abdomen, both groins, and both lower extremities are prepared and draped in a sterile manner. A time out procedure is performed.   Percutaneous Vascular Access:  Percutaneous arterial and venous access were obtained on the right side.  Using ultrasound guidance the right common femoral vein was cannulated using the Seldinger technique and single Perclose vascular closure devise was placed, after which time an 8 French sheath inserted.  The right common femoral artery was cannulated using a micropuncture wire and sheath.  A pair of Perclose vascular closure devices were placed at opposing 30 degree angles in the femoral artery, and a 8 French sheath inserted.  The right internal jugular vein was cannulated  using ultrasound guidance and an 8 French sheath inserted.     Surgical Approach:  A redo median sternotomy incision was performed.  The sternum was divided with an oscillating saw.  Sternal re-entry is uneventful.  Electrocautery and sharp dissection is utilized to dissect the structures of the anterior mediastinum away from the posterior table of the sternum.  Both the left and right pleural spaces are opened.  A retractor is placed.  Dissection is continued to identify the free margin of the pericardium and the ascending aorta.  There are relatively mild adhesions with adhesions primarily located around the aortic root and between the right atrium and the right lung.  The patient is heparinized systemically.   Extracorporeal Cardiopulmonary Bypass and Myocardial Protection:  The right common femoral vein is cannulated through the venous sheath and a guidewire advanced into the right atrium using TEE guidance.  The femoral vein cannulated using a 22 Fr long femoral venous cannula.  The right common femoral artery is cannulated through the arterial sheath and a guidewire advanced into the  descending thoracic aorta using TEE guidance.  Femoral artery is cannulated with a 18 French femoral arterial cannula.  The right internal jugular vein is cannulated through the venous sheath and a guidewire advanced into the  right atrium.  The internal jugular vein is cannulated using a 14 French pediatric femoral venous cannula.   Adequate heparinization is verified.   The entire pre-bypass portion of the operation was notable for stable hemodynamics.  Cardiopulmonary bypass was begun.  Dissection is continued to free up the structures of the mediastinum.  This is technically straightforward with only minimal adhesions other than that surrounding the right atrium and along the aortic root.  A retrograde cardioplegia cannula was placed through the right atrium into the coronary sinus.  Umbilical tapes were placed around the superior vena cava and the inferior vena cava.   A cardioplegia cannula is placed in the ascending aorta.  A temperature probe was placed in the interventricular septum.  The patient is cooled to 28C systemic temperature.  The aortic cross clamp is applied and cardioplegia is delivered initially in an antegrade fashion through the aortic root using modified [place] cold blood cardioplegia (Kennestone blood cardioplegia protocol).   The initial cardioplegic arrest is rapid with early diastolic arrest.  Repeat doses of cardioplegia are administered at 90 minutes and every 30 minutes thereafter through the aortic root and the coronary sinus catheter in order to maintain completely flat electrocardiogram.  Myocardial protection was felt to be excellent.   Maze Procedure (left atrial lesion set):  The AtriCure Synergy bipolar radiofrequency ablation clamp is used for all radiofrequency ablation lesions for the maze procedure.  The Atricure CryoICE nitrous oxide cryothermy system is utilized for all cryothermy ablation lesions.   The heart is retracted towards the surgeon's side and the  left sided pulmonary veins exposed.  An elliptical ablation lesion is created around the base of the left sided pulmonary veins.  A similar elliptical lesion was created around the base of the left atrial appendage.  The left atrial appendage was obliterated using an left atrial appendage clip (Atricure Pro245 left atrial clip size 49mm).  The heart was replaced into the pericardial sac.  An elliptical ablation lesion is created around the base of the right sided pulmonary veins.    A left atriotomy incision was performed through the interatrial groove and extended partially across the back wall of the left atrium after opening the oblique sinus inferiorly.  The floor of the left atrium and the mitral valve were exposed using a self-retaining retractor.  The mitral valve was inspected.    A bipolar ablation lesion was placed across the dome of the left atrium from the cephalad apex of the atriotomy incision to reach the cephalad apex of the elliptical lesion around the left sided pulmonary veins.  A similar bipolar lesion was placed across the back wall of the left atrium from the caudad apex of the atriotomy incision to reach the caudad apex of the elliptical lesion around the left sided pulmonary veins, thereby completing a box.  Finally another bipolar lesion was placed across the back wall of the left atrium from the caudad apex of the atriotomy incision towards the posterior mitral valve annulus.  This lesion was completed along the endocardial surface onto the posterior mitral annulus with a 3 minute duration cryothermy lesion, followed by a second cryothermy lesion along the posterior epicardial surface of the left atrium across the coronary sinus with the probe extending up the epicardial surface of the lateral wall of the right atrium. This completes the entire left side lesion set of the Cox maze procedure.   Mitral Valve Repair:  The mitral valve was inspected and notable for fibroelastic  deficiency type myxomatous degenerative disease.  There is a single ruptured primary chordae tendinae emanating from middle scallop (P2) of the posterior leaflet.  Interrupted 2-0 Ethibond horizontal mattress sutures are placed circumferentially around the entire mitral valve annulus. The sutures will ultimately be utilized for ring annuloplasty, and at this juncture there are utilized to suspend the valve symmetrically.  Artificial neochord placement was performed using Chord-X multi-strand CV-4 Goretex pre-measured loops.  The appropriate cord length was measured from corresponding normal length primary cords from the P3 segment of the posterior leaflet. The papillary muscle suture of the Chord-X multi-strand suture was placed through the head of the posterior papillary muscle in a horizontal mattress fashion and tied over Teflon felt pledgets. Two of the three pre-measured loops were then reimplanted into the free margin of the P2 segment of the posterior leaflet.    The valve was tested with saline and appeared competent even without ring annuloplasty complete. The valve was sized to a 28 mm annuloplasty ring, based upon the transverse distance between the left and right commissures and the height of the anterior leaflet, corresponding to a size just slightly larger than the overall surface area of the anterior leaflet.  A flexible annuloplasty band is chosen because of concerns related to distortion of the anterior portion of the mitral annulus caused by the pre-existing bioprosthetic tissue valve in aortic position.  A Sorin Carbomedics Annuloflex annuloplasty ring (size 86mm, catalog [id number], serial [id number]) was secured in place uneventfully. All ring sutures were secured using a Cor-knot device.    The valve was tested with saline and appeared competent. There is no residual leak. There was a broad, symmetrical line of coaptation of the anterior and posterior leaflet.  The left atriotomy incision  is closed after placing the sump drain across the mitral valve to serve as a left ventricular vent.     Maze procedure (right atrial lesion set) and resection of anomalous right atrial muscle band:  The inferior vena cava cannula was pulled down until the tip was just below the junction between the right atrium and the inferior vena cava. An oblique incision is made in the right atrium. Traction sutures are placed to facilitate exposure of the tricuspid valve.   Upon entry into the right atrium it is immediately obvious there is a very large anomalous muscle band traversing across the right atrium parallel to the orientation of the crista terminalis.  Because of concerns regarding the potential for reentry tachycardia arrhythmias, this muscle band is excised and removed.  The tricuspid valve is inspected carefully. The tricuspid valve leaflets appear normal with normal mobility and no sign of any fibrosis or thickening.   The AtriCure Synergy bipolar radiofrequency ablation clamp is utilized to create a series of linear lesions in the right atrium, each with one limb of the clamp along the endocardial surface and the other along the epicardial surface. The first lesion is placed from the posterior apex of the atriotomy incision and along the lateral wall of the right atrium to reach the lateral aspect of the superior vena cava, connecting with the cryothermy lesion placed previously across the coronary sinus and up the epicardial surface of the lateral right atrium.  A second lesion is placed in the opposite direction from the posterior apex of the atriotomy incision along the lateral wall to reach the lateral aspect of the inferior vena cava. A third lesion is placed from the midportion of the atriotomy incision extending at a right angle  to reach the tip of the right atrial appendage. A fourth lesion is placed from the anterior apex of the atriotomy incision in an anterior and inferior direction to reach  the acute margin of the heart. Finally, the cryotherapy probe is utilized to complete the right atrial lesion set by placing the probe along the endocardial surface of the right atrium from the anterior apex of the atriotomy incision to reach the tricuspid annulus at the 2:00 position.   Tricuspid Valve Repair:  Tricuspid ring annuloplasty is performed using interrupted 2-0 Ethibond horizontal mattress sutures placed circumferentially around the tricuspid annulus with exception of the area immediately below the triangle of Koch.  The tricuspid valve was sized to accept a 28 mm annuloplasty ring based upon the overall surface area of the combined anterior and posterior leaflets.  An Edwards [facility] 3 annuloplasty ring (size 28 mm, model #4900, serial #[id number]) is implanted uneventfully. All sutures were secured using a Cor-knot device.  After placement of the ring a single dose of warm "reanimation dose" blood cardioplegia was given through the aortic root and the aortic cross clamp removed after a total cross clamp duration of 157 minutes.  After completion of the annuloplasty the valve was tested with saline and appears to be competent. The right atriotomy incision is closed using a 2 layer closure of running 4-0 Prolene suture.   Procedure Completion:  Epicardial pacing wires are fixed to the right ventricular outflow tract and to the right atrial appendage. The patient is rewarmed to 37C temperature. The aortic and left ventricular vents are removed.  The patient is weaned and disconnected from cardiopulmonary bypass.  The patient's rhythm at separation from bypass was AV paced.  The patient was weaned from cardiopulmonary bypass without any inotropic support. Total cardiopulmonary bypass time for the operation was 205 minutes.  Followup transesophageal echocardiogram performed after separation from bypass revealed a well-seated annuloplasty ring in the mitral position.  The mitral valve is functioning  normally.  There was no residual mitral regurgitation.  Mean gradient across mitral valve was estimated between 4 and 5 mmHg.  The bioprosthetic tissue valve in the aortic position was functioning normally.  There was no aortic insufficiency.  Left ventricular systolic function remained entirely normal.  There was a well-seated annuloplasty ring in the tricuspid position.  The tricuspid valve was functioning normally with mild central residual tricuspid regurgitation.  Right ventricular size and function appeared normal.  The femoral arterial and venous cannulas were removed and all Perclose sutures secured.  Manual pressure was maintained while Protamine was administered.  The right internal jugular cannula was removed and manual pressure held on the right neck.  The mediastinum and both pleural spaces were inspected for hemostasis and irrigated with saline solution.  There was mild coagulopathy.  The patient received a total of 2 packs adult platelets and 2 units fresh frozen plasma due to coagulopathy and thrombocytopenia after separation from cardiopulmonary bypass and reversal of heparin with protamine.  The mediastinum and both pleural spaces were drained using 4 chest tubes placed through separate stab incisions inferiorly.  The sternum is closed with double strength sternal wire. The soft tissues anterior to the sternum were closed in multiple layers and the skin is closed with a running subcuticular skin closure.  The post-bypass portion of the operation was notable for stable rhythm and hemodynamics.   Patient Disposition:  The patient tolerated the procedure well and is transported to the surgical intensive care in stable condition. There  are no intraoperative complications. All sponge instrument and needle counts are verified correct at completion of the operation.     [name]. Gerald Manns MD 01/30/2018 3:27 PM

## 2018-01-30 NOTE — Research (Addendum)
Address abnormal labs: [facility] study visit 2 labs obtained for pre CABG @ 0705 Urine obtained @ 0830 [id number]  MOBILITY:    I HAVE NO PROBLEMS WALKING [x]   I HAVE SLIGHT PROBLEMS WALKING []   I HAVE MODERATE PROBLEMS WALKING []   I HAVE SEVERE PROBLEMS WALKING []   I AM UNABLE TO WALK  []     SELF-CARE:   I HAVE NO PROBLEMS WASING OR DRESSING MYSELF  [x]   I HAVE SLIGHT PROBLEMS WASHING OR DRESSING MYSELF  []   I HAVE MODERATE PROBLEMS WASHING OR DRESSING MYSELF []   I HAVE SEVERE PROBLEMS WASHING OR DRESSING MYSELF  []   I HAVE SEVERE PROBLEMS WASHING OR DRESSING MYSELF  []   I AM UNABLE TO WASH OR DRESS MYSELF []     USUAL ACTIVITIES: (E.G. WORK/STUDY/HOUSEWORK/FAMILY OR LEISURE ACTIVITIES.    I HAVE NO PROBLEMS DOING MY USUAL ACTIVITIES [x]   I HAVE SLIGHT PROBLEMS DOING MY USUAL ACTIVITIES []   I HAVE MODERATE PROBLEMS DOING MY USUAL ACTIVIITIES []   I HAVE SEVERE PROBLEMS DOING MY USUAL ACTIVITIES []   I AM UNABLE TO DO MY USUAL ACTIVITIES []     PAIN /DISCOMFORT   I HAVE NO PAIN OR DISCOMFORT [x]   I HAVE SLIGHT PAIN OR DISCOMFORT []   I HAVE MODERATE PAIN OR DISCOMFORT []   I HAVE SEVERE PAIN OR DISCOMFORT []   I HAVE EXTREME PAIN OR DISCOMFORT []     ANXIETY/DEPRESSION   I AM NOT ANXIOUS OR DEPRESSED [x]   I AM SLIGHTLY ANXIOUS OR DEPRESSED []   I AM MODERATELY ANXIOUS OR DREPRESSED []   I AM SEVERELY ANXIOUS OR DEPRESSED []   I AM EXTREMELY ANXIOUS OR DEPRESSED []     SCALE OF 0-100 HOW WOULD YOU RATE TODAY?  0 IS THE WORSE AND 100 IS THE BEST HEALTH YOU CAN IMAGINE: 95   Research required URINE SAMPLES: Nephrocheck Sample # 1 collected @ 0623 results 0.29                        Sample #2 collected @  1927 results 0.08 Patient is in the [place]:

## 2018-01-30 NOTE — Interval H&P Note (Signed)
History and Physical Interval Note:  01/30/2018 5:26 AM  [name].  has presented today for surgery, with the diagnosis of MR AFIB  The various methods of treatment have been discussed with the patient and family. After consideration of risks, benefits and other options for treatment, the patient has consented to  Procedure(s): REDO STERNOTOMY (N/A) MITRAL VALVE REPAIR (MVR) (N/A) MAZE (N/A) as a surgical intervention .  The patient's history has been reviewed, patient examined, no change in status, stable for surgery.  I have reviewed the patient's chart and labs.  Questions were answered to the patient's satisfaction.     Rexene Alberts

## 2018-01-30 NOTE — Progress Notes (Signed)
RT reassessed pt for readiness to wean second phase, pt was able to hold head off of pillow for 20s, pt able to follow commands/directions upon request. RT started pt on CPAP/PSV 10/5 with FIO2 40%. RN will obtain ABG in 20 minutes and then NIF and VC will be performed to assess pt for possible extubation per protocol. RT will continue to monitor.

## 2018-01-30 NOTE — Progress Notes (Signed)
      [address]       [place] [address]             [phone]      S/p redo sternotomy for mitral and tricuspid repair and Maze  Intubated, sedated  BP 95/71   Pulse 89   Temp (!) 97.3 F (36.3 C)   Resp 12   Ht 6' (1.829 m)   Wt 73.9 kg   SpO2 100%   BMI 22.11 kg/m  35/18 CI=2.2 dopamine at 3, neo at 65  Intake/Output Summary (Last 24 hours) at 01/30/2018 1907 Last data filed at 01/30/2018 1813 Gross per 24 hour  Intake 5828.03 ml  Output 2785 ml  Net 3043.03 ml  CT ~ 100/hr  Hct=29  Doing well early postop  Remo Lipps C. Roxan Hockey, MD [facility] [phone]

## 2018-01-30 NOTE — Anesthesia Procedure Notes (Signed)
Procedure Name: Intubation Date/Time: 01/30/2018 8:13 AM Performed by: Barrington Ellison, CRNA Pre-anesthesia Checklist: Patient identified, Emergency Drugs available, Suction available and Patient being monitored Patient Re-evaluated:Patient Re-evaluated prior to induction Oxygen Delivery Method: Circle System Utilized Preoxygenation: Pre-oxygenation with 100% oxygen Induction Type: IV induction Ventilation: Mask ventilation without difficulty Laryngoscope Size: Mac and 4 Grade View: Grade I Tube type: Oral Tube size: 8.0 mm Number of attempts: 1 Airway Equipment and Method: Stylet and Oral airway Placement Confirmation: ETT inserted through vocal cords under direct vision,  positive ETCO2 and breath sounds checked- equal and bilateral Secured at: 22 cm Tube secured with: Tape Dental Injury: Teeth and Oropharynx as per pre-operative assessment

## 2018-01-30 NOTE — Anesthesia Postprocedure Evaluation (Signed)
Anesthesia Post Note  Patient: Gracin Mcpartland.  Procedure(s) Performed: STERNOTOMY (N/A Chest) MITRAL VALVE REPAIR (MVR) USING CARBOMEDICS ANNULOFLEX SIZE 28 (N/A Chest) MAZE (N/A ) CLIPPING OF LEFT ATRIAL APPENDAGE USING ATRICLIP PRO2 45MM (N/A Chest) TRICUSPID VALVE REPAIR USING MC3 SIZE 28 (N/A Chest)     Patient location during evaluation: SICU Anesthesia Type: General Level of consciousness: sedated Pain management: pain level controlled Vital Signs Assessment: post-procedure vital signs reviewed and stable Respiratory status: patient remains intubated per anesthesia plan Cardiovascular status: stable Postop Assessment: no apparent nausea or vomiting Anesthetic complications: no    Last Vitals:  Vitals:   01/30/18 0639 01/30/18 1553  BP:  97/60  Pulse: 63 80  Resp: 16 12  Temp:    SpO2: 100% 100%    Last Pain:  Vitals:   01/30/18 0604  TempSrc:   PainSc: 0-No pain                 Mirelle Biskup DANIEL

## 2018-01-30 NOTE — Brief Op Note (Signed)
01/30/2018  3:20 PM  PATIENT:  [name].  83 y.o. male  PRE-OPERATIVE DIAGNOSIS:  1. SEVERE MR 2.MODERATE TR 3. PERSISTENT AFIB  POST-OPERATIVE DIAGNOSIS: 1. SEVERE MR 2.MODERATE TR 3. PERSISTENT AFIB  PROCEDURE: REDO STERNOTOMY, PERCUTANEOUS VASCULAR ACCESS for CARDIOPULMONARY BYPASS, MITRAL VALVE REPAIR (MVR) (USING CARBOMEDICS ANNULOFLEX Model # AF  828, Serial # [id number],MBEM 75), TRICUSPID VALVE REPAIR (USING MC3 Model# 4492, Serial # [id number], Size 28), BIPOLAR RADIO FREQUENCY and CRYOTHERMY MAZE, CLIPPING OF LEFT ATRIAL APPENDAGE (USING ATRICLIP PRO2 45MM)  SURGEON:  Surgeon(s) and Role:    Rexene Alberts, MD - Primary  PHYSICIAN ASSISTANT: Lars Pinks PA-C  ASSISTANTS: Vernie Murders RNFA  ANESTHESIA:   general  EBL:  1200 mL   BLOOD ADMINISTERED:Two FFP and two PLTS  DRAINS: Chest tubes placed in the mediastinal and pleural spaces   COUNTS CORRECT:  YES  DICTATION: .Dragon Dictation  PLAN OF CARE: Admit to inpatient   PATIENT DISPOSITION:  ICU - intubated and hemodynamically stable.   Delay start of Pharmacological VTE agent (>24hrs) due to surgical blood loss or risk of bleeding: yes  BASELINE WEIGHT: 73.9 kg

## 2018-01-30 NOTE — Progress Notes (Signed)
RT assessed pts readiness to wean, pt able to hold head off of pillow for 20s, pt able to follow commands/follow directions. RT started pt wean with SIMV/PRVC/PSV 650VT, rate of 4, PEEP of 5, and FIO2 of 40% per protocol. Pt will be reassessed for next phase of protocol at 2132. RT will continue to monitor.

## 2018-01-30 NOTE — Anesthesia Procedure Notes (Signed)
Central Venous Catheter Insertion Performed by: [name], MD, anesthesiologist Start/End1/23/2020 6:42 AM, 01/30/2018 6:52 AM Patient location: Pre-op. Preanesthetic checklist: patient identified, IV checked, site marked, risks and benefits discussed, surgical consent, monitors and equipment checked, pre-op evaluation, timeout performed and anesthesia consent Position: Trendelenburg Lidocaine 1% used for infiltration and patient sedated Hand hygiene performed , maximum sterile barriers used  and Seldinger technique used Catheter size: 8.5 Fr Total catheter length 8. PA cath was placed.Sheath introducer Swan type:thermodilution PA Cath depth:50 Procedure performed using ultrasound guided technique. Ultrasound Notes:anatomy identified, needle tip was noted to be adjacent to the nerve/plexus identified, no ultrasound evidence of intravascular and/or intraneural injection and image(s) printed for medical record Attempts: 1 Following insertion, line sutured, dressing applied and Biopatch. Post procedure assessment: free fluid flow, blood return through all ports and no air  Patient tolerated the procedure well with no immediate complications.

## 2018-01-31 ENCOUNTER — Encounter (HOSPITAL_COMMUNITY): Payer: Self-pay | Admitting: Thoracic Surgery (Cardiothoracic Vascular Surgery)

## 2018-01-31 ENCOUNTER — Other Ambulatory Visit: Payer: Self-pay

## 2018-01-31 ENCOUNTER — Inpatient Hospital Stay (HOSPITAL_COMMUNITY): Payer: Medicare Other

## 2018-01-31 LAB — CBC
HCT: 27.9 % — ABNORMAL LOW (ref 39.0–52.0)
HCT: 26.9 % — ABNORMAL LOW (ref 39.0–52.0)
Hemoglobin: 9.3 g/dL — ABNORMAL LOW (ref 13.0–17.0)
Hemoglobin: 8.7 g/dL — ABNORMAL LOW (ref 13.0–17.0)
MCH: 33 pg (ref 26.0–34.0)
MCH: 32.6 pg (ref 26.0–34.0)
MCHC: 33.3 g/dL (ref 30.0–36.0)
MCHC: 32.3 g/dL (ref 30.0–36.0)
MCV: 98.9 fL (ref 80.0–100.0)
MCV: 100.7 fL — ABNORMAL HIGH (ref 80.0–100.0)
Platelets: 116 10*3/uL — ABNORMAL LOW (ref 150–400)
Platelets: 92 10*3/uL — ABNORMAL LOW (ref 150–400)
RBC: 2.82 MIL/uL — ABNORMAL LOW (ref 4.22–5.81)
RBC: 2.67 MIL/uL — ABNORMAL LOW (ref 4.22–5.81)
RDW: 13.2 % (ref 11.5–15.5)
RDW: 13.7 % (ref 11.5–15.5)
WBC: 12.7 10*3/uL — ABNORMAL HIGH (ref 4.0–10.5)
WBC: 12.7 10*3/uL — ABNORMAL HIGH (ref 4.0–10.5)
nRBC: 0 % (ref 0.0–0.2)
nRBC: 0 % (ref 0.0–0.2)

## 2018-01-31 LAB — PREPARE PLATELET PHERESIS
Unit division: 0
Unit division: 0

## 2018-01-31 LAB — BASIC METABOLIC PANEL
Anion gap: 9 (ref 5–15)
Anion gap: 6 (ref 5–15)
BUN: 12 mg/dL (ref 8–23)
BUN: 15 mg/dL (ref 8–23)
CO2: 17 mmol/L — ABNORMAL LOW (ref 22–32)
CO2: 23 mmol/L (ref 22–32)
Calcium: 7.8 mg/dL — ABNORMAL LOW (ref 8.9–10.3)
Calcium: 8 mg/dL — ABNORMAL LOW (ref 8.9–10.3)
Chloride: 116 mmol/L — ABNORMAL HIGH (ref 98–111)
Chloride: 108 mmol/L (ref 98–111)
Creatinine, Ser: 0.97 mg/dL (ref 0.61–1.24)
Creatinine, Ser: 0.92 mg/dL (ref 0.61–1.24)
GFR calc Af Amer: >60 mL/min (ref >60)
GFR calc Af Amer: >60 mL/min (ref >60)
GFR calc non Af Amer: >60 mL/min (ref >60)
GFR calc non Af Amer: >60 mL/min (ref >60)
Glucose, Bld: 178 mg/dL — ABNORMAL HIGH (ref 70–99)
Glucose, Bld: 146 mg/dL — ABNORMAL HIGH (ref 70–99)
Potassium: 4.1 mmol/L (ref 3.5–5.1)
Potassium: 4.5 mmol/L (ref 3.5–5.1)
Sodium: 142 mmol/L (ref 135–145)
Sodium: 137 mmol/L (ref 135–145)

## 2018-01-31 LAB — BPAM FFP
Blood Product Expiration Date: 202001272359
Blood Product Expiration Date: 202001272359
ISSUE DATE / TIME: 202001231353
ISSUE DATE / TIME: 202001231353
Unit Type and Rh: 600
Unit Type and Rh: 6200

## 2018-01-31 LAB — GLUCOSE, CAPILLARY
Glucose-Capillary: 164 mg/dL — ABNORMAL HIGH (ref 70–99)
Glucose-Capillary: 124 mg/dL — ABNORMAL HIGH (ref 70–99)
Glucose-Capillary: 130 mg/dL — ABNORMAL HIGH (ref 70–99)
Glucose-Capillary: 154 mg/dL — ABNORMAL HIGH (ref 70–99)
Glucose-Capillary: 147 mg/dL — ABNORMAL HIGH (ref 70–99)

## 2018-01-31 LAB — POCT I-STAT 7, (LYTES, BLD GAS, ICA,H+H)
Acid-base deficit: 7 mmol/L — ABNORMAL HIGH (ref 0.0–2.0)
Acid-base deficit: 4 mmol/L — ABNORMAL HIGH (ref 0.0–2.0)
Acid-base deficit: 2 mmol/L (ref 0.0–2.0)
Bicarbonate: 17.6 mmol/L — ABNORMAL LOW (ref 20.0–28.0)
Bicarbonate: 19.9 mmol/L — ABNORMAL LOW (ref 20.0–28.0)
Bicarbonate: 24.1 mmol/L (ref 20.0–28.0)
Calcium, Ion: 1.16 mmol/L (ref 1.15–1.40)
Calcium, Ion: 1.08 mmol/L — ABNORMAL LOW (ref 1.15–1.40)
Calcium, Ion: 1.21 mmol/L (ref 1.15–1.40)
HCT: 28 % — ABNORMAL LOW (ref 39.0–52.0)
HCT: 24 % — ABNORMAL LOW (ref 39.0–52.0)
HCT: 24 % — ABNORMAL LOW (ref 39.0–52.0)
Hemoglobin: 9.5 g/dL — ABNORMAL LOW (ref 13.0–17.0)
Hemoglobin: 8.2 g/dL — ABNORMAL LOW (ref 13.0–17.0)
Hemoglobin: 8.2 g/dL — ABNORMAL LOW (ref 13.0–17.0)
O2 Saturation: 98 %
O2 Saturation: 97 %
O2 Saturation: 53 %
Patient temperature: 36.4
Patient temperature: 36.7
Patient temperature: 98.1
Potassium: 4.2 mmol/L (ref 3.5–5.1)
Potassium: 4.9 mmol/L (ref 3.5–5.1)
Potassium: 4.6 mmol/L (ref 3.5–5.1)
Sodium: 143 mmol/L (ref 135–145)
Sodium: 144 mmol/L (ref 135–145)
Sodium: 142 mmol/L (ref 135–145)
TCO2: 19 mmol/L — ABNORMAL LOW (ref 22–32)
TCO2: 21 mmol/L — ABNORMAL LOW (ref 22–32)
TCO2: 25 mmol/L (ref 22–32)
pCO2 arterial: 29.7 mmHg — ABNORMAL LOW (ref 32.0–48.0)
pCO2 arterial: 29.7 mmHg — ABNORMAL LOW (ref 32.0–48.0)
pCO2 arterial: 43.6 mmHg (ref 32.0–48.0)
pH, Arterial: 7.378 (ref 7.350–7.450)
pH, Arterial: 7.434 (ref 7.350–7.450)
pH, Arterial: 7.349 — ABNORMAL LOW (ref 7.350–7.450)
pO2, Arterial: 111 mmHg — ABNORMAL HIGH (ref 83.0–108.0)
pO2, Arterial: 87 mmHg (ref 83.0–108.0)
pO2, Arterial: 29 mmHg — CL (ref 83.0–108.0)

## 2018-01-31 LAB — BPAM PLATELET PHERESIS
Blood Product Expiration Date: 202001242359
Blood Product Expiration Date: 202001252359
ISSUE DATE / TIME: 202001231353
ISSUE DATE / TIME: 202001231353
Unit Type and Rh: 6200
Unit Type and Rh: 6200

## 2018-01-31 LAB — MAGNESIUM
Magnesium: 2.4 mg/dL (ref 1.7–2.4)
Magnesium: 2.2 mg/dL (ref 1.7–2.4)

## 2018-01-31 LAB — PREPARE FRESH FROZEN PLASMA
Unit division: 0
Unit division: 0

## 2018-01-31 MED ORDER — FUROSEMIDE 10 MG/ML IJ SOLN
20.0000 mg | Freq: Two times a day (BID) | INTRAMUSCULAR | Status: DC
  Administered 2018-01-31 – 2018-02-02 (×5): 20 mg via INTRAVENOUS
  Filled 2018-01-31 (×5): qty 2

## 2018-01-31 MED ORDER — ASPIRIN EC 325 MG PO TBEC: 325.0000 mg | DELAYED_RELEASE_TABLET | Freq: Every day | ORAL | Status: DC

## 2018-01-31 MED ORDER — ORAL CARE MOUTH RINSE
15.0000 mL | Freq: Two times a day (BID) | OROMUCOSAL | Status: DC
  Administered 2018-02-01: 15 mL via OROMUCOSAL

## 2018-01-31 MED ORDER — ASPIRIN EC 81 MG PO TBEC
81.0000 mg | DELAYED_RELEASE_TABLET | Freq: Every day | ORAL | Status: DC
  Administered 2018-02-01 – 2018-02-05 (×5): 81 mg via ORAL
  Filled 2018-01-31 (×5): qty 1

## 2018-01-31 MED ORDER — WARFARIN SODIUM 2.5 MG PO TABS
2.5000 mg | ORAL_TABLET | Freq: Every day | ORAL | Status: DC
  Administered 2018-01-31 – 2018-02-02 (×3): 2.5 mg via ORAL
  Filled 2018-01-31 (×3): qty 1

## 2018-01-31 MED ORDER — WARFARIN - PHYSICIAN DOSING INPATIENT
Freq: Every day | Status: DC
  Administered 2018-02-01: 18:00:00
  Administered 2018-02-02: 1
  Administered 2018-02-03: 18:00:00

## 2018-01-31 MED ORDER — ASPIRIN 81 MG PO CHEW: 81.0000 mg | CHEWABLE_TABLET | Freq: Every day | ORAL | Status: DC

## 2018-01-31 MED ORDER — ENOXAPARIN SODIUM 30 MG/0.3ML ~~LOC~~ SOLN
30.0000 mg | SUBCUTANEOUS | Status: DC
  Administered 2018-02-01 – 2018-02-04 (×4): 30 mg via SUBCUTANEOUS
  Filled 2018-01-31 (×4): qty 0.3

## 2018-01-31 MED FILL — Thrombin (Recombinant) For Soln 5000 Unit: CUTANEOUS | Qty: 5000 | Status: AC

## 2018-01-31 NOTE — Progress Notes (Addendum)
[facility] DAILY ICU PROGRESS NOTE                   [address]            [place] [address]          [phone]   1 Day Post-Op Procedure(s) (LRB): STERNOTOMY (N/A) MITRAL VALVE REPAIR (MVR) USING CARBOMEDICS ANNULOFLEX SIZE 28 (N/A) MAZE (N/A) CLIPPING OF LEFT ATRIAL APPENDAGE USING ATRICLIP PRO2 45MM (N/A) TRICUSPID VALVE REPAIR USING MC3 SIZE 28 (N/A)  Total Length of Stay:  LOS: 1 day   Subjective: Patient awake, alert. He is thirsty this am and has incisional pain.  Objective: Vital signs in last 24 hours: Temp:  [96.3 F (35.7 C)-98.8 F (37.1 C)] 98.8 F (37.1 C) (01/24 0600) Pulse Rate:  [80-90] 85 (01/24 0600) Cardiac Rhythm: A-V Sequential paced (01/23 2000) Resp:  [12-27] 23 (01/24 0600) BP: (95-112)/(52-82) 108/67 (01/24 0600) SpO2:  [96 %-100 %] 97 % (01/24 0600) Arterial Line BP: (88-120)/(52-70) 116/57 (01/24 0600) FiO2 (%):  [40 %-50 %] 40 % (01/24 0034) Weight:  [78.9 kg] 78.9 kg (01/24 0500)  Filed Weights   01/30/18 0542 01/31/18 0500  Weight: 73.9 kg 78.9 kg    Weight change: 4.964 kg   Hemodynamic parameters for last 24 hours: PAP: (28-42)/(12-23) 37/20 CO:  [2.7 L/min-6.2 L/min] 6.2 L/min CI:  [1.4 L/min/m2-3.2 L/min/m2] 3.2 L/min/m2  Intake/Output from previous day: 01/23 0701 - 01/24 0700 In: 7276.2 [I.V.:4637.9; Blood:1757; IV Piggyback:881.3] Out: 0938 [Urine:2115; Emesis/NG output:145; Blood:1200; Chest Tube:730]  Intake/Output this shift: No intake/output data recorded.  Current Meds: Scheduled Meds: . acetaminophen  1,000 mg Oral Q6H  . allopurinol  150 mg Oral Daily  . aspirin EC  325 mg Oral Daily  . [START ON 02/01/2018] aspirin EC  81 mg Oral Daily  . bisacodyl  10 mg Oral Daily   Or  . bisacodyl  10 mg Rectal Daily  . chlorhexidine gluconate (MEDLINE KIT)  15 mL Mouth Rinse BID  . Chlorhexidine Gluconate Cloth  6 each Topical Daily  . docusate sodium  200 mg Oral Daily  . insulin aspart  0-24 Units  Subcutaneous Q4H  . mouth rinse  15 mL Mouth Rinse 10 times per day  . [START ON 02/01/2018] pantoprazole  40 mg Oral Daily  . sodium chloride flush  10-40 mL Intracatheter Q12H  . tamsulosin  0.8 mg Oral QPC breakfast   Continuous Infusions: . sodium chloride Stopped (01/31/18 0607)  . albumin human 12.5 g (01/30/18 2001)  . cefUROXime (ZINACEF)  IV 1.5 g (01/31/18 0648)  . DOPamine 3 mcg/kg/min (01/31/18 0600)  . lactated ringers    . lactated ringers 20 mL/hr at 01/31/18 0600   PRN Meds:.albumin human, metoprolol tartrate, morphine injection, ondansetron (ZOFRAN) IV, oxyCODONE, sodium chloride flush, traMADol  General appearance: alert, cooperative and no distress Neurologic: intact Heart: Paced, rub with chest tubes in place Lungs: Slightly diminished at bases Abdomen: Soft, non tender, sporadic bowel sounds Extremities: SCDs in place. Feet warm bilaterally Wound: Aquacel intact. Right groin wound is clean and dry, no hematoma.   Lab Results: CBC: Recent Labs    01/30/18 2137  01/31/18 0214 01/31/18 0247  WBC 12.7*  --   --  12.7*  HGB 9.3*   < > 8.2* 9.3*  HCT 27.3*   < > 24.0* 27.9*  PLT 124*  --   --  116*   < > = values in this interval not  displayed.   BMET:  Recent Labs    01/30/18 2245  01/31/18 0214 01/31/18 0247  NA 141   < > 144 142  K 4.0   < > 4.9 4.1  CL 115*  --   --  116*  CO2 19*  --   --  17*  GLUCOSE 196*  --   --  178*  BUN 13  --   --  12  CREATININE 0.89  --   --  0.97  CALCIUM 7.6*  --   --  7.8*   < > = values in this interval not displayed.    CMET: Lab Results  Component Value Date   WBC 12.7 (H) 01/31/2018   HGB 9.3 (L) 01/31/2018   HCT 27.9 (L) 01/31/2018   PLT 116 (L) 01/31/2018   GLUCOSE 178 (H) 01/31/2018   CHOL 87 10/25/2016   TRIG 54 10/25/2016   HDL 26 (L) 10/25/2016   LDLCALC 50 10/25/2016   ALT 23 01/20/2018   AST 25 01/20/2018   NA 142 01/31/2018   K 4.1 01/31/2018   CL 116 (H) 01/31/2018   CREATININE 0.97  01/31/2018   BUN 12 01/31/2018   CO2 17 (L) 01/31/2018   TSH 1.920 05/02/2017   INR 1.56 01/30/2018   HGBA1C 5.9 (H) 01/20/2018      PT/INR:  Recent Labs    01/30/18 1553  LABPROT 18.4*  INR 1.56   Radiology: Dg Chest Port 1 View  Result Date: 01/31/2018 CLINICAL DATA:  Open heart surgery.  Chest tube EXAM: PORTABLE CHEST 1 VIEW COMPARISON:  01/30/2018 FINDINGS: Endotracheal tube removed. Swan-Ganz catheter in the main pulmonary artery. Mediastinal drains in place. NG tube removed. Bibasilar chest tubes. Negative for pneumothorax. Bibasilar atelectasis with mild progression. IMPRESSION: Endotracheal tube removed.  Negative for pneumothorax. Progression of bibasilar atelectasis. Electronically Signed   By: Franchot Gallo M.D.   On: 01/31/2018 07:08   Dg Chest Port 1 View  Result Date: 01/30/2018 CLINICAL DATA:  Atelectasis.  Postop valve replacement EXAM: PORTABLE CHEST 1 VIEW COMPARISON:  01/20/2018 FINDINGS: Endotracheal tube 5.7 cm from carina. NG tube extends into the stomach. Swan-Ganz catheter tip in the pulmonary artery. Atrial clip noted. Mediastinal drain and LEFT and RIGHT chest tube noted. Minimal atelectasis.  No pulmonary edema.  No pneumothorax. IMPRESSION: 1. Support apparatus appears appropriately position. 2. Minimal atelectasis. Electronically Signed   By: Suzy Bouchard M.D.   On: 01/30/2018 16:39     Assessment/Plan: S/P Procedure(s) (LRB): STERNOTOMY (N/A) MITRAL VALVE REPAIR (MVR) USING CARBOMEDICS ANNULOFLEX SIZE 28 (N/A) MAZE (N/A) CLIPPING OF LEFT ATRIAL APPENDAGE USING ATRICLIP PRO2 45MM (N/A) TRICUSPID VALVE REPAIR USING MC3 SIZE 28 (N/A)  1. CV-AV paced. CO/CI 6.2/3.2. On Dopamine drip.  2. Pulmonary-Chest tubes with 730 cc output last 24 hours.On 4 liters of oxygen via [place]. CXR this am shows patient rotated to the right, no pneumothorax, bibasilar atelectasis. Encourage incentive spirometer and flutter valve. 3. ABL anemia-H and H stable at 9.3 and  27.9 this am 4. Volume overload-will give 20 mg Lasix IV bid today 5. CBGs 111/183/164. Pre op HGA1C 5.9. He likely has pre diabetes. Once tolerating po better, will stop accu checks and SS PRN. 6. Please see progression orders    Donielle Liston Alba PA-C 01/31/2018 7:30 AM   I have seen and examined the patient and agree with the assessment and plan as outlined.  Doing very well POD1.  Slow junctional rhythm w/ HR 40-50 under pacer.  Currently DDD pacing.  Stable hemodynamics, no drips.  Breathing comfortably w/ O2 sats 99% on 4 L/min.  Expected post op acute blood loss anemia, Hgb 9.3 this morning.  Acute on chronic diastolic CHF with expected post-op volume excess, mild, weight 5 kg > preop.   Mobilize  Gentle diuresis  D/C lines  D/C tubes later today or tomorrow, depending on output  Start Coumadin   Rexene Alberts, MD 01/31/2018 9:13 AM

## 2018-01-31 NOTE — Progress Notes (Signed)
RT assessed pts readiness to wean for second time, pt is able to hold head off of pillow, follow commands/directions without difficulty. RT started pt wean with SIMV/PRVC/PSV, VT 650, Rate 4, PEEP 5 and FIO2 of 40% per protocol. Pt will be reassessed for the next phase of weaning at [place]. RT will continue to monitor.

## 2018-01-31 NOTE — Progress Notes (Signed)
Patient ID: Gerald Amas., male   DOB: [birth year], 83 y.o.   MRN: [id number] EVENING ROUNDS NOTE :     [address]       [place] [address]             [phone]                 1 Day Post-Op Procedure(s) (LRB): STERNOTOMY (N/A) MITRAL VALVE REPAIR (MVR) USING CARBOMEDICS ANNULOFLEX SIZE 28 (N/A) MAZE (N/A) CLIPPING OF LEFT ATRIAL APPENDAGE USING ATRICLIP PRO2 45MM (N/A) TRICUSPID VALVE REPAIR USING MC3 SIZE 28 (N/A)  Total Length of Stay:  LOS: 1 day  BP 93/81 (BP Location: Right Arm)   Pulse 79   Temp 99.3 F (37.4 C) (Oral)   Resp 13   Ht 6' (1.829 m)   Wt 78.9 kg   SpO2 99%   BMI 23.59 kg/m   .Intake/Output      01/24 0701 - 01/25 0700   P.O. 720   I.V. (mL/kg) 356.4 (4.5)   Blood    IV Piggyback 100   Total Intake(mL/kg) 1176.4 (14.9)   Urine (mL/kg/hr) 1035 (1)   Emesis/NG output    Blood    Chest Tube 390   Total Output 1425   Net -248.6         . sodium chloride Stopped (01/31/18 0607)  . cefUROXime (ZINACEF)  IV 1.5 g (01/31/18 2002)  . lactated ringers    . lactated ringers 20 mL/hr at 01/31/18 2000     Lab Results  Component Value Date   WBC 12.7 (H) 01/31/2018   HGB 8.2 (L) 01/31/2018   HCT 24.0 (L) 01/31/2018   PLT 92 (L) 01/31/2018   GLUCOSE 146 (H) 01/31/2018   CHOL 87 10/25/2016   TRIG 54 10/25/2016   HDL 26 (L) 10/25/2016   LDLCALC 50 10/25/2016   ALT 23 01/20/2018   AST 25 01/20/2018   NA 142 01/31/2018   K 4.6 01/31/2018   CL 108 01/31/2018   CREATININE 0.92 01/31/2018   BUN 15 01/31/2018   CO2 23 01/31/2018   TSH 1.920 05/02/2017   INR 1.56 01/30/2018   HGBA1C 5.9 (H) 01/20/2018   Av paced Awake and alert    Grace Isaac MD  Beeper [phone] Office [phone] 01/31/2018 8:34 PM

## 2018-01-31 NOTE — Progress Notes (Signed)
RT reassessed pt for readiness to wean pt to second phase of protocol, pt was able to hold head off of pillow for 20s, pt was also able to follow directions/commands upon request. RT started pt on CPAP/PSV 10/5 with FIO2 of 40%. RN will obtain a repeat gas in 20 minutes and RT will obtain VC and NIF to assess pt for possible extubation per protocol. RT will continue to monitor.

## 2018-01-31 NOTE — Procedures (Signed)
Extubation Procedure Note  Patient Details:   Name: [name]. DOB: June 16, 1935 MRN: [id number]   Airway Documentation:    Vent end date: 01/31/18 Vent end time: 0110   Evaluation  O2 sats: stable throughout  Complications: No apparent complications Patient did tolerate procedure well. Bilateral Breath Sounds: Clear, Diminished   Yes   Pt extubated to 4Lpm [place] without complication. Pt has no stridor present, pt able to say name and express where he is at this time. Pt NIF -28 cmH2O and VC .650 L/min. Pt had cuff leak present prior to extubation, VS stable and WOB is within normal limits. ABG appropriate for extubation.   Roby Lofts Hubert Raatz 01/31/2018, 1:18 AM

## 2018-01-31 NOTE — Discharge Summary (Signed)
Physician Discharge Summary       [address]       [place] [address]             [phone]    Patient ID: [name]. MRN: [id number] DOB/AGE: 07/09/1935 83 y.o.  Admit date: 01/30/2018 Discharge date: 02/05/2018  Admission Diagnoses: 1. Severe mitral regurgitation 2. Severe tricuspid regurgitation 3. Recurrent persistent atrial fibrillation  Discharge Diagnoses:  1. S/P mitral valve repair + tricuspid valve repair + maze procedure 2.  3. History of essential hypertension 4. History of S/P AVR and PFO closure 5. History of benign essential HTN 6. History of borderline diabetes mellitus 7. History of gout 8. History of adenomatous polyp of colon-tubular adenoma 9. History of urinary retention 10. History of stroke [facility])     Procedure (s):   Mitral Valve Repair             Redo median sternotomy             Percutaneous arterial and venous cannulation             Complex mitral valvuloplasty including artificial Gore-tex neochord placement x4             Sorin Carbomedics Annuloflex ring annuloplasty (size 76mm, catalog # [id number], serial # [id number])   Tricuspid Valve Repair             Edwards mc3 ring annuloplasty (size 56mm, model # [id number], serial # [id number])   Maze Procedure              complete bilateral atrial lesion set using bipolar radiofrequency and cryothermy ablation             clipping of left atrial appendage (Atricure Pro245 left atrial clip, size 66mm)             Removal of large, 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 Roxy Manns on 01/30/2018  History of Presenting Illness: Patient is an 83 year old male status post aortic valve replacement using abioprosthetictissue valve in 2012,hypertension, atrial fibrillation on long-term anticoagulation, and previous embolic stroke in 1062 who has been referred for surgical consultation to discuss treatment options for management of mitral valve prolapse with stage  D severe symptomatic primary mitral regurgitation and persistent atrial fibrillation.  Patient's cardiac history dates back to 2012 when he underwent aortic valve replacement using a74mm Edwards Magna Easestented bovine pericardial tissue valve via right minithoracotomy approach for severe aortic insufficiency. He recovered uneventfully and was followed for a period of time by Dr. Marlou Porch but subsequently lost to follow-up. He has been followed since 2018 by Truitt Merle, and previous echocardiograms document the presence of normal functioning bioprosthetic tissue valve in aortic position with mild mitral regurgitation and normal left ventricular systolic function. 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 He was promptly treated with catheter directed therapy and he recovered remarkably well. Transthoracic echocardiogram performed at that time revealed normal left ventricular function with normal functioning bioprosthetic tissue valve in aortic position and what was felt to be moderate mitral regurgitation. The patient was notably in atrial fibrillation. Transesophageal echocardiogram was not performed but the patient was started on Eliquis for long-term anticoagulation. He recovered remarkably well from his stroke and has eventually returned to essentially baseline physical status.He did report decreased exercise tolerance with poor endurance, and he is admitted to some exertional shortness of breath with more strenuous  activity. However, the patient continues to walk every day and he exercises religiously, including doing push-ups and lifting some weights. Recent follow-up echocardiogram revealed preserved left ventricular function but worsening mitral regurgitation. He was referred to Dr. Burt Knack for consultation and underwent transesophageal echocardiogram and diagnostic cardiac catheterization on October 14, 2017. TEE revealed normal left ventricular systolic function with normal functioning bioprosthetic tissue valve in aortic position. There was severe mitral regurgitation with mitral valve prolapse including a flail segment involving the middle scallop of the posterior leaflet. There was severe left atrial enlargement. There was severe tricuspid regurgitation with bileaflet prolapse. Catheterization revealed mild nonobstructive coronary artery disease with no change in comparison with catheterization performed in 2012. There were large V waves on wedge tracing consistent with severe mitral regurgitation. Pulmonary artery pressures were mildly elevated.  Patient is married and lives locally in [place] with his wife. He has been retired for many years but has remained physically active and quite vigorous throughout his retirement. He walks daily and exercises religiously. He does admit to some exertional shortness of breath but this occurs only with strenuous physical exertion. He denies any history of exertional chest pain or chest tightness. He has not had dizziness, palpitations, nor syncopal episodes. His mobility remains quite good and he reports that he has essentially recovered completely from his stroke with no significant residual physical limitation with perhaps "very mild" weakness in his left arm.   Patient is an 83 year old malestatus post aortic valve replacement using abioprosthetictissue valve in 2012,hypertension, atrial fibrillation on long-term anticoagulation, and previous embolic stroke in 8756 whoreturns to the office today for follow-up of mitral valve prolapse with severe symptomatic primary mitral regurgitation.He was originally seen in consultation on October 15, 2017 and he was last seen here in our office on December 16, 2017 at which time we made plans for definitive surgical intervention. The patient returns to the office today with plans to proceed with  mitral valve repair and Maze procedure on Thursday, January 30, 2018. He reports no new problems or complaints since his last office visit.  Dr. Roxy Manns again reviewed the indications, risk, and potential benefits of mitral valve repair, Maze procedure and possible tricuspid valve repair with the patient and his wife in the office today.The rationale for elective surgery has been explained, including a comparison between surgery and continued medical therapy with close follow-up. The likelihood of successful and durable valve repair has been discussed with particular reference to the findings of their recent echocardiogram. Based upon these findings and previous experience, Dr. Roxy Manns quoted them a greater than 90percent likelihood of successful valve repair. In the unlikely event that hisvalve cannot be successfully repaired, we discussed the possibility of replacing the mitral valve using a mechanical prosthesis with the attendant need for long-term anticoagulation versus the alternative of replacing it using a bioprosthetic tissue valve with its potential for late structural valve deterioration and failure, depending upon the patient's longevity. The patient specifically requests that if the mitral valve must be replaced that it be done using a bioprosthetic tissuevalve. The relative risks and benefits of performing a maze procedure at the time of their surgery were discussed and patient agreed to proceed with surgery. Patient underwent a redo sternotomy, MV and TV repair, MAZE, and LA clip on 01/23.  Brief Hospital Course:   The patient was extubated the evening of surgery without difficulty. He/she remained afebrile and hemodynamically stable. Gordy Councilman, a line, chest tubes, and foley were removed early in the  post operative course.  He did develop some residual urinary retention and required I/O catheterization.  He was restarted on Flomax and Proscar for this. He developed junctional bradycardia  and was not started on a beta blocker. He was paced for the first several days post op. He was then put on VVI backup on 01/27.  His underlying rhythm was  junctional with heart rate in the low 50's. After transfer from the ICU, his heart rate was increased into the 60-70's. He has been asymptomatic, even with previous bradycardia. He was volume over loaded and diuresed. He had ABL anemia. He did not require a post op transfusion. Last H and H was up to 8.9 and 27.7. He was weaned off the insulin drip.  The patient's HGA1C pre op was  5.9.  He was started on Coumadin for his MV Repair and MAZE procedure.  His INR on 01/29 is up to 1.33.  He will be discharged home on 5 mg of Coumadin. The patient was felt surgically stable for transfer from the ICU to PCTU for further convalescence on 01/28. He continues to progress with cardiac rehab. He was ambulating on room air. He has been tolerating a diet and has had a bowel movement. Epicardial pacing wires were removed on 01/28.  His blood pressure has increased so as discussed with Dr. Roxy Manns, he will be started on Lisinopril 5 mg daily.Chest tube sutures will be removed in the office after discharge. Patient requests a rolling walker which has been arranged.The patient is felt surgically stable for discharge today.  Latest Vital Signs: Blood pressure (!) 147/89, pulse 86, temperature (!) 96.9 F (36.1 C), temperature source Axillary, resp. rate 18, height 6' (1.829 m), weight 73.2 kg, SpO2 98 %.  Physical Exam: Cardiovascular: IRRR Pulmonary: Slightly diminished a bases Abdomen: Soft, non tender, bowel sounds present. Extremities: No  lower extremity edema. Wounds: Clean and dry.  No erythema or signs of infection. Right groin is clean and dry, no hematoma.  Discharge Condition: Stable and discharged to home.  Recent laboratory studies:  Lab Results  Component Value Date   WBC 7.5 02/05/2018   HGB 8.9 (L) 02/05/2018   HCT 27.7 (L) 02/05/2018   MCV 99.6  02/05/2018   PLT 133 (L) 02/05/2018   Lab Results  Component Value Date   NA 140 02/04/2018   K 4.0 02/04/2018   CL 105 02/04/2018   CO2 26 02/04/2018   CREATININE 0.71 02/04/2018   GLUCOSE 121 (H) 02/04/2018    Diagnostic Studies: EXAM: CHEST - 2 VIEW  COMPARISON:  02/03/2018  FINDINGS: Postsurgical changes are again seen and stable. Small left pleural effusion is again noted. No pneumothorax is seen. No focal infiltrate is noted. No acute bony abnormality is noted.  IMPRESSION: Small left pleural effusion.  No other focal abnormality is noted.   Electronically Signed   By: Inez Catalina M.D.   On: 02/04/2018 09:27   Dg Chest 2 View  Result Date: 01/20/2018 CLINICAL DATA:  Preoperative examination. Patient for cardiac valve replacement. EXAM: CHEST - 2 VIEW COMPARISON:  CT chest 05/08/2017. Single-view of the chest 10/24/2016. FINDINGS: The lungs are clear. Heart size is upper normal. No pneumothorax or pleural effusion. Prosthetic aortic valve is noted. No acute or focal bony abnormality. IMPRESSION: No acute disease. Electronically Signed   By: Inge Rise M.D.   On: 01/20/2018 16:30     Vas US Doppler Pre Cabg  Result Date: 01/20/2018 PREOPERATIVE VASCULAR EVALUATION  Indications:  Pre- op re do sternotomy. Performing Technologist: Birdena Crandall, [place] [facility]  Examination Guidelines: A complete evaluation includes B-mode imaging, spectral Doppler, color Doppler, and power Doppler as needed of all accessible portions of each vessel. Bilateral testing is considered an integral part of a complete examination. Limited examinations for reoccurring indications may be performed as noted.  Right Carotid Findings: +----------+--------+--------+--------+--------+--------------------+           PSV cm/sEDV cm/sStenosisDescribeComments             +----------+--------+--------+--------+--------+--------------------+ CCA Prox  101     11                                            +----------+--------+--------+--------+--------+--------------------+ CCA Distal82      17                      mild intimal changes +----------+--------+--------+--------+--------+--------------------+ ICA Prox  78      6                       mild intimal changes +----------+--------+--------+--------+--------+--------------------+ ICA Mid   68      19                                           +----------+--------+--------+--------+--------+--------------------+ ICA Distal35      10                                           +----------+--------+--------+--------+--------+--------------------+ ECA       89      5                       mild intimal changes +----------+--------+--------+--------+--------+--------------------+ Portions of this table do not appear on this page. +----------+--------+-------+--------+------------+           PSV cm/sEDV cmsDescribeArm Pressure +----------+--------+-------+--------+------------+ Subclavian65                     127          +----------+--------+-------+--------+------------+ +---------+--------+--+--------+-+ VertebralPSV cm/s38EDV cm/s6 +---------+--------+--+--------+-+ Left Carotid Findings: +----------+--------+--------+--------+------------+--------------------+           PSV cm/sEDV cm/sStenosisDescribe    Comments             +----------+--------+--------+--------+------------+--------------------+ CCA Prox  88      16                          mild intimal changes +----------+--------+--------+--------+------------+--------------------+ CCA Distal78      18                          mild intimal changes +----------+--------+--------+--------+------------+--------------------+ ICA Prox  89      9               heterogenousminute focal plaque  +----------+--------+--------+--------+------------+--------------------+ ICA Mid   76      25                                                +----------+--------+--------+--------+------------+--------------------+  ICA Distal74      18                                               +----------+--------+--------+--------+------------+--------------------+ ECA       92      7               heterogenousminimal plaque       +----------+--------+--------+--------+------------+--------------------+ +----------+--------+--------+--------+------------+ SubclavianPSV cm/sEDV cm/sDescribeArm Pressure +----------+--------+--------+--------+------------+           94                      126          +----------+--------+--------+--------+------------+ +---------+--------+--+--------+--+ VertebralPSV cm/s76EDV cm/s14 +---------+--------+--+--------+--+  ABI Findings: +--------+------------------+-----+---------+--------+ Right   Rt Pressure (mmHg)IndexWaveform Comment  +--------+------------------+-----+---------+--------+ [id number]                    triphasic         +--------+------------------+-----+---------+--------+ +--------+------------------+-----+---------+-------+ Left    Lt Pressure (mmHg)IndexWaveform Comment +--------+------------------+-----+---------+-------+ [id number]                    triphasic        +--------+------------------+-----+---------+-------+  Right Doppler Findings: +-----------+--------+-----+---------+--------+ Site       PressureIndexDoppler  Comments +-----------+--------+-----+---------+--------+ Brachial   127          triphasic         +-----------+--------+-----+---------+--------+ Radial                  triphasic         +-----------+--------+-----+---------+--------+ Ulnar                   triphasic         +-----------+--------+-----+---------+--------+ Palmar Arch                      normal   +-----------+--------+-----+---------+--------+  Left Doppler Findings: +-----------+--------+-----+---------+--------+ Site        PressureIndexDoppler  Comments +-----------+--------+-----+---------+--------+ Brachial   126          triphasic         +-----------+--------+-----+---------+--------+ Radial                  triphasic         +-----------+--------+-----+---------+--------+ Ulnar                   triphasic         +-----------+--------+-----+---------+--------+ Palmar Arch                      normal   +-----------+--------+-----+---------+--------+  Summary: Right Carotid: There is no evidence of stenosis in the right ICA. Left Carotid: Velocities in the left ICA are consistent with a 1-39% stenosis. Vertebrals:  Bilateral vertebral arteries demonstrate antegrade flow. Subclavians: Normal flow hemodynamics were seen in bilateral subclavian              arteries. Right Upper Extremity: Doppler waveforms remain within normal limits with right radial compression. Doppler waveforms remain within normal limits with right ulnar compression. Left Upper Extremity: Doppler waveforms remain within normal limits with left radial compression. Doppler waveforms remain within normal limits with left ulnar compression.  Electronically signed by Deitra Mayo MD on 01/20/2018 at 5:09:42 PM.    Final    Discharge Medications:  Allergies as of 02/05/2018      Reactions   Sulfa Antibiotics Other (See Comments)   Granulocytosis      Medication List    STOP taking these medications   amLODipine 10 MG tablet Commonly known as:  NORVASC   atenolol 25 MG tablet Commonly known as:  TENORMIN   ELIQUIS 5 MG Tabs tablet Generic drug:  apixaban     TAKE these medications   allopurinol 300 MG tablet Commonly known as:  ZYLOPRIM Take 150 mg by mouth daily.   amoxicillin 500 MG capsule Commonly known as:  AMOXIL Take 2,000 mg by mouth See admin instructions. Take 2000 mg by mouth 1 hour prior to dental appointment   aspirin 81 MG EC tablet Take 1 tablet (81 mg total) by mouth daily.   b complex  vitamins tablet Take 1 tablet by mouth daily.   diphenhydrAMINE 2 % cream Commonly known as:  BENADRYL Apply 1 application topically daily as needed for itching.   finasteride 5 MG tablet Commonly known as:  PROSCAR Take 1 tablet (5 mg total) by mouth daily.   lisinopril 5 MG tablet Commonly known as:  PRINIVIL,ZESTRIL Take 1 tablet (5 mg total) by mouth daily.   tamsulosin 0.4 MG Caps capsule Commonly known as:  FLOMAX Take 2 capsules (0.8 mg total) by mouth daily after breakfast.   traMADol 50 MG tablet Commonly known as:  ULTRAM Take 1 tablet (50 mg total) by mouth every 4 (four) hours as needed for moderate pain.   triamcinolone cream 0.1 % Commonly known as:  KENALOG Apply 1 application topically daily as needed.   warfarin 5 MG tablet Commonly known as:  COUMADIN Take 1 tablet (5 mg total) by mouth daily at 6 PM. Or as directed            Durable Medical Equipment  (From admission, onward)         Start     Ordered   02/05/18 0858  For home use only DME Walker rolling  Once    Question:  Patient needs a walker to treat with the following condition  Answer:  Physical deconditioning   02/05/18 0858         The patient has been discharged on:   1.Beta Blocker:  Yes [   ]                              No   [  x ]                              If No, reason:Junctional, bradycardia  2.Ace Inhibitor/ARB: Yes [ x  ]                                     No  [    ]                                     If No, reason  3.Statin:   Yes [   ]                  No  [  x ]  If No, reason:No CAD  4.Shela Commons:  Yes  [ x  ]                  No   [   ]                  If No, reason:  Follow Up Appointments: Follow-up Information    Rexene Alberts, MD. Go on 03/03/2018.   Specialty:  Cardiothoracic Surgery Why:  PA/LAT CXR to be taken (at [facility] which is in the same building as Dr. Guy Sandifer office) on [phone] at 1:30 pm;Appointment time is at  2:00 pm and is with physician assistant Contact information: [place] [address] [phone]        Burtis Junes, NP. Go on 02/25/2018.   Specialties:  Nurse Practitioner, Interventional Cardiology, Cardiology, Radiology Why:  Appointment time is at 1:30 pm  Contact information: [place]. [address] [place] [address] [phone]        [facility] Office. Go on 02/10/2018.   Specialty:  Cardiology Why:  Appointment time is at 10:30 am. Appointment is for PT and INR to be drawn (on Coumadin for MV repair, MAZE as history of a fib).  Contact information: [address] [place] [phone]       Nurse. Go on 02/12/2018.   Why:  Appointment is with nurse for chest tube suture removal only. Appointment time is at 10:00 am Contact information: [place] [address]          Signed: Sharalyn Ink [facility] 02/05/2018, 9:25 AM

## 2018-01-31 NOTE — Plan of Care (Signed)
Patient drowsy but alert.  Indicates pain in left side chest pointing toward the chest tubes.  Patient repositioned.  Explained that some of the pressure he was feeling was from the position of the chest tubes.  Preparing patient for weaning to extubate.  Monitoring

## 2018-02-01 ENCOUNTER — Inpatient Hospital Stay (HOSPITAL_COMMUNITY): Payer: Medicare Other

## 2018-02-01 LAB — CBC
HCT: 25.2 % — ABNORMAL LOW (ref 39.0–52.0)
Hemoglobin: 8.4 g/dL — ABNORMAL LOW (ref 13.0–17.0)
MCH: 33.6 pg (ref 26.0–34.0)
MCHC: 33.3 g/dL (ref 30.0–36.0)
MCV: 100.8 fL — ABNORMAL HIGH (ref 80.0–100.0)
Platelets: 77 10*3/uL — ABNORMAL LOW (ref 150–400)
RBC: 2.5 MIL/uL — ABNORMAL LOW (ref 4.22–5.81)
RDW: 13.8 % (ref 11.5–15.5)
WBC: 11.7 10*3/uL — ABNORMAL HIGH (ref 4.0–10.5)
nRBC: 0 % (ref 0.0–0.2)

## 2018-02-01 LAB — BASIC METABOLIC PANEL
Anion gap: 5 (ref 5–15)
BUN: 17 mg/dL (ref 8–23)
CO2: 25 mmol/L (ref 22–32)
Calcium: 8 mg/dL — ABNORMAL LOW (ref 8.9–10.3)
Chloride: 108 mmol/L (ref 98–111)
Creatinine, Ser: 0.94 mg/dL (ref 0.61–1.24)
GFR calc Af Amer: >60 mL/min (ref >60)
GFR calc non Af Amer: >60 mL/min (ref >60)
Glucose, Bld: 120 mg/dL — ABNORMAL HIGH (ref 70–99)
Potassium: 4.1 mmol/L (ref 3.5–5.1)
Sodium: 138 mmol/L (ref 135–145)

## 2018-02-01 LAB — GLUCOSE, CAPILLARY
Glucose-Capillary: 108 mg/dL — ABNORMAL HIGH (ref 70–99)
Glucose-Capillary: 113 mg/dL — ABNORMAL HIGH (ref 70–99)
Glucose-Capillary: 126 mg/dL — ABNORMAL HIGH (ref 70–99)
Glucose-Capillary: 127 mg/dL — ABNORMAL HIGH (ref 70–99)
Glucose-Capillary: 127 mg/dL — ABNORMAL HIGH (ref 70–99)

## 2018-02-01 LAB — PROTIME-INR
INR: 1.51
Prothrombin Time: 18 seconds — ABNORMAL HIGH (ref 11.4–15.2)

## 2018-02-01 NOTE — Progress Notes (Signed)
Patient ID: Gerald Amas., male   DOB: 1935/11/23, 83 y.o.   MRN: [id number] EVENING ROUNDS NOTE :     [address]       [place] [address]             [phone]                 2 Days Post-Op Procedure(s) (LRB): STERNOTOMY (N/A) MITRAL VALVE REPAIR (MVR) USING CARBOMEDICS ANNULOFLEX SIZE 28 (N/A) MAZE (N/A) CLIPPING OF LEFT ATRIAL APPENDAGE USING ATRICLIP PRO2 45MM (N/A) TRICUSPID VALVE REPAIR USING MC3 SIZE 28 (N/A)  Total Length of Stay:  LOS: 2 days  BP 111/68   Pulse 79   Temp 98.7 F (37.1 C) (Oral)   Resp 15   Ht 6' (1.829 m)   Wt 78 kg   SpO2 94%   BMI 23.32 kg/m   .Intake/Output      01/25 0701 - 01/26 0700   P.O. 720   I.V. (mL/kg) 295.2 (3.8)   IV Piggyback    Total Intake(mL/kg) 1015.2 (13)   Urine (mL/kg/hr) 1475 (1.5)   Chest Tube 240   Total Output 1715   Net -699.8         . sodium chloride Stopped (01/31/18 0607)  . lactated ringers    . lactated ringers 20 mL/hr at 02/01/18 1600     Lab Results  Component Value Date   WBC 11.7 (H) 02/01/2018   HGB 8.4 (L) 02/01/2018   HCT 25.2 (L) 02/01/2018   PLT 77 (L) 02/01/2018   GLUCOSE 120 (H) 02/01/2018   CHOL 87 10/25/2016   TRIG 54 10/25/2016   HDL 26 (L) 10/25/2016   LDLCALC 50 10/25/2016   ALT 23 01/20/2018   AST 25 01/20/2018   NA 138 02/01/2018   K 4.1 02/01/2018   CL 108 02/01/2018   CREATININE 0.94 02/01/2018   BUN 17 02/01/2018   CO2 25 02/01/2018   TSH 1.920 05/02/2017   INR 1.51 02/01/2018   HGBA1C 5.9 (H) 01/20/2018   Stable day Paced     Grace Isaac MD  Beeper [phone] Office [phone] 02/01/2018 7:57 PM

## 2018-02-01 NOTE — Progress Notes (Signed)
   02/01/18 0600  Mobility  Head of Bed Elevated  HOB 30  Activity Ambulated in hall  Range of Motion Active;All extremities  Level of Assistance Maximum assist, patient does 25-49%  Distance Ambulated (ft) 40 ft  Mobility Response Tolerated poorly  Bed Position Semi-fowlers   While ambulating in hall, patient became short of breath, diaphoretic, and unstable on his feet. Patient returned to room in chair. Patient able to stand and pivot to bed. Venturi mask placed to assist WOB. 2mg  Morphine given for complaint of pain during inspiration. Lung sounds clear and equal bilaterally although diminished in the bases. Chest tubes remain patent. Will continue to monitor closely.

## 2018-02-01 NOTE — Progress Notes (Addendum)
Patient ID: Gerald Amas., male   DOB: Sep 26, 1935, 83 y.o.   MRN: [id number] TCTS DAILY ICU PROGRESS NOTE                   [address]            [place] [address]          [phone]   2 Days Post-Op Procedure(s) (LRB): STERNOTOMY (N/A) MITRAL VALVE REPAIR (MVR) USING CARBOMEDICS ANNULOFLEX SIZE 28 (N/A) MAZE (N/A) CLIPPING OF LEFT ATRIAL APPENDAGE USING ATRICLIP PRO2 45MM (N/A) TRICUSPID VALVE REPAIR USING MC3 SIZE 28 (N/A)  Total Length of Stay:  LOS: 2 days   Subjective: Patient awake alert no specific complaints.  Was weak when walked this morning  Objective: Vital signs in last 24 hours: Temp:  [97.9 F (36.6 C)-99.3 F (37.4 C)] 98.9 F (37.2 C) (01/25 0743) Pulse Rate:  [78-85] 80 (01/25 0800) Cardiac Rhythm: A-V Sequential paced (01/25 0400) Resp:  [12-21] 15 (01/25 0800) BP: (93-134)/(52-82) 123/74 (01/25 0800) SpO2:  [95 %-100 %] 95 % (01/25 0800) FiO2 (%):  [55 %] 55 % (01/25 0630) Weight:  [78 kg] 78 kg (01/25 0600)  Filed Weights   01/30/18 0542 01/31/18 0500 02/01/18 0600  Weight: 73.9 kg 78.9 kg 78 kg    Weight change: -0.9 kg   Hemodynamic parameters for last 24 hours:    Intake/Output from previous day: 01/24 0701 - 01/25 0700 In: 2030.8 [P.O.:1320; I.V.:510.8; IV Piggyback:200] Out: 2545 [Urine:1745; Chest Tube:800]  Intake/Output this shift: Total I/O In: 260.1 [P.O.:240; I.V.:20.1] Out: 350 [Urine:350]  Current Meds: Scheduled Meds: . acetaminophen  1,000 mg Oral Q6H  . allopurinol  150 mg Oral Daily  . aspirin EC  81 mg Oral Daily  . bisacodyl  10 mg Oral Daily   Or  . bisacodyl  10 mg Rectal Daily  . Chlorhexidine Gluconate Cloth  6 each Topical Daily  . docusate sodium  200 mg Oral Daily  . enoxaparin (LOVENOX) injection  30 mg Subcutaneous Q24H  . furosemide  20 mg Intravenous BID  . mouth rinse  15 mL Mouth Rinse BID  . pantoprazole  40 mg Oral Daily  . sodium chloride flush  10-40 mL  Intracatheter Q12H  . tamsulosin  0.8 mg Oral QPC breakfast  . warfarin  2.5 mg Oral q1800  . Warfarin - Physician Dosing Inpatient   Does not apply q1800   Continuous Infusions: . sodium chloride Stopped (01/31/18 0607)  . lactated ringers    . lactated ringers 20 mL/hr at 02/01/18 0800   PRN Meds:.metoprolol tartrate, morphine injection, ondansetron (ZOFRAN) IV, oxyCODONE, sodium chloride flush, traMADol  General appearance: alert and cooperative Neurologic: intact Heart: regular rate and rhythm, S1, S2 normal, no murmur, click, rub or gallop Lungs: diminished breath sounds bibasilar Abdomen: soft, non-tender; bowel sounds normal; no masses,  no organomegaly Extremities: extremities normal, atraumatic, no cyanosis or edema and Homans sign is negative, no sign of DVT Wound: Sternum stable  Lab Results: CBC: Recent Labs    01/31/18 1603 01/31/18 1610 02/01/18 0403  WBC 12.7*  --  11.7*  HGB 8.7* 8.2* 8.4*  HCT 26.9* 24.0* 25.2*  PLT 92*  --  77*   BMET:  Recent Labs    01/31/18 1603 01/31/18 1610 02/01/18 0403  NA 137 142 138  K 4.5 4.6 4.1  CL 108  --  108  CO2 23  --  25  GLUCOSE 146*  --  120*  BUN 15  --  17  CREATININE 0.92  --  0.94  CALCIUM 8.0*  --  8.0*    CMET: Lab Results  Component Value Date   WBC 11.7 (H) 02/01/2018   HGB 8.4 (L) 02/01/2018   HCT 25.2 (L) 02/01/2018   PLT 77 (L) 02/01/2018   GLUCOSE 120 (H) 02/01/2018   CHOL 87 10/25/2016   TRIG 54 10/25/2016   HDL 26 (L) 10/25/2016   LDLCALC 50 10/25/2016   ALT 23 01/20/2018   AST 25 01/20/2018   NA 138 02/01/2018   K 4.1 02/01/2018   CL 108 02/01/2018   CREATININE 0.94 02/01/2018   BUN 17 02/01/2018   CO2 25 02/01/2018   TSH 1.920 05/02/2017   INR 1.51 02/01/2018   HGBA1C 5.9 (H) 01/20/2018      PT/INR:  Recent Labs    02/01/18 0403  LABPROT 18.0*  INR 1.51   Radiology: No results found.   Assessment/Plan: S/P Procedure(s) (LRB): STERNOTOMY (N/A) MITRAL VALVE  REPAIR (MVR) USING CARBOMEDICS ANNULOFLEX SIZE 28 (N/A) MAZE (N/A) CLIPPING OF LEFT ATRIAL APPENDAGE USING ATRICLIP PRO2 45MM (N/A) TRICUSPID VALVE REPAIR USING MC3 SIZE 28 (N/A) Mobilize Diuresis Diabetes control d/c tubes/lines Expected Acute  Blood - loss Anemia- continue to monitor  Creatinine remains stable Patient on Coumadin, INR 1.5 Platelets 77,000, continue to monitor Remains AV paced with underlying junctional rhythm 52 Getting IV Lasix twice daily currently  Grace Isaac 02/01/2018 8:33 AM

## 2018-02-02 ENCOUNTER — Inpatient Hospital Stay (HOSPITAL_COMMUNITY): Payer: Medicare Other

## 2018-02-02 LAB — CBC
HCT: 28.5 % — ABNORMAL LOW (ref 39.0–52.0)
Hemoglobin: 9.3 g/dL — ABNORMAL LOW (ref 13.0–17.0)
MCH: 32.1 pg (ref 26.0–34.0)
MCHC: 32.6 g/dL (ref 30.0–36.0)
MCV: 98.3 fL (ref 80.0–100.0)
Platelets: 82 10*3/uL — ABNORMAL LOW (ref 150–400)
RBC: 2.9 MIL/uL — ABNORMAL LOW (ref 4.22–5.81)
RDW: 13.4 % (ref 11.5–15.5)
WBC: 12.2 10*3/uL — ABNORMAL HIGH (ref 4.0–10.5)
nRBC: 0 % (ref 0.0–0.2)

## 2018-02-02 LAB — BASIC METABOLIC PANEL
Anion gap: 8 (ref 5–15)
BUN: 19 mg/dL (ref 8–23)
CO2: 27 mmol/L (ref 22–32)
Calcium: 8.5 mg/dL — ABNORMAL LOW (ref 8.9–10.3)
Chloride: 103 mmol/L (ref 98–111)
Creatinine, Ser: 0.8 mg/dL (ref 0.61–1.24)
GFR calc Af Amer: >60 mL/min (ref >60)
GFR calc non Af Amer: >60 mL/min (ref >60)
Glucose, Bld: 154 mg/dL — ABNORMAL HIGH (ref 70–99)
Potassium: 3.5 mmol/L (ref 3.5–5.1)
Sodium: 138 mmol/L (ref 135–145)

## 2018-02-02 LAB — PROTIME-INR
INR: 1.28
Prothrombin Time: 15.9 seconds — ABNORMAL HIGH (ref 11.4–15.2)

## 2018-02-02 MED ORDER — POTASSIUM CHLORIDE CRYS ER 20 MEQ PO TBCR
20.0000 meq | EXTENDED_RELEASE_TABLET | ORAL | Status: DC | PRN
  Administered 2018-02-02: 20 meq via ORAL
  Filled 2018-02-02: qty 1

## 2018-02-02 MED ORDER — FINASTERIDE 5 MG PO TABS
5.0000 mg | ORAL_TABLET | Freq: Every day | ORAL | Status: DC
  Administered 2018-02-02 – 2018-02-05 (×4): 5 mg via ORAL
  Filled 2018-02-02 (×5): qty 1

## 2018-02-02 NOTE — Progress Notes (Signed)
Patient ID: [name], male   DOB: 11/28/1935, 82 y.o.   MRN: [id number] EVENING ROUNDS NOTE :     [address]       [place] [address]             [phone]                 3 Days Post-Op Procedure(s) (LRB): STERNOTOMY (N/A) MITRAL VALVE REPAIR (MVR) USING CARBOMEDICS ANNULOFLEX SIZE 28 (N/A) MAZE (N/A) CLIPPING OF LEFT ATRIAL APPENDAGE USING ATRICLIP PRO2 45MM (N/A) TRICUSPID VALVE REPAIR USING MC3 SIZE 28 (N/A)  Total Length of Stay:  LOS: 3 days  BP 120/69   Pulse 71   Temp 98.7 F (37.1 C) (Oral)   Resp 15   Ht 6' (1.829 m)   Wt 76.8 kg   SpO2 100%   BMI 22.96 kg/m   .Intake/Output      01/26 0701 - 01/27 0700   P.O.    I.V. (mL/kg) 10 (0.1)   Other 60   Total Intake(mL/kg) 70 (0.9)   Urine (mL/kg/hr) 750 (0.8)   Chest Tube 20   Total Output 770   Net -700         . sodium chloride Stopped (01/31/18 7096)  . lactated ringers    . lactated ringers 10 mL/hr at 02/02/18 0217     Lab Results  Component Value Date   WBC 12.2 (H) 02/02/2018   HGB 9.3 (L) 02/02/2018   HCT 28.5 (L) 02/02/2018   PLT 82 (L) 02/02/2018   GLUCOSE 154 (H) 02/02/2018   CHOL 87 10/25/2016   TRIG 54 10/25/2016   HDL 26 (L) 10/25/2016   LDLCALC 50 10/25/2016   ALT 23 01/20/2018   AST 25 01/20/2018   NA 138 02/02/2018   K 3.5 02/02/2018   CL 103 02/02/2018   CREATININE 0.80 02/02/2018   BUN 19 02/02/2018   CO2 27 02/02/2018   TSH 1.920 05/02/2017   INR 1.28 02/02/2018   HGBA1C 5.9 (H) 01/20/2018   Not much appitite Has had trouble voiding , I/o cath x 2 Not eating much Walked around Ellin Saba MD  Beeper [phone] Office [phone] 02/02/2018 7:19 PM

## 2018-02-02 NOTE — Progress Notes (Signed)
Patient ID: Gerald Amas., male   DOB: [birth year], 83 y.o.   MRN: [id number] TCTS DAILY ICU PROGRESS NOTE                   [address]            [place] [address]          [phone]   3 Days Post-Op Procedure(s) (LRB): STERNOTOMY (N/A) MITRAL VALVE REPAIR (MVR) USING CARBOMEDICS ANNULOFLEX SIZE 28 (N/A) MAZE (N/A) CLIPPING OF LEFT ATRIAL APPENDAGE USING ATRICLIP PRO2 45MM (N/A) TRICUSPID VALVE REPAIR USING MC3 SIZE 28 (N/A)  Total Length of Stay:  LOS: 3 days   Subjective: Awake alert neurologically intact.  Notes that he had to be in and out cath once last night, he is back on Flomax  Objective: Vital signs in last 24 hours: Temp:  [98.3 F (36.8 C)-99.7 F (37.6 C)] 98.3 F (36.8 C) (01/26 0758) Pulse Rate:  [78-84] 78 (01/26 0700) Cardiac Rhythm: A-V Sequential paced (01/26 0400) Resp:  [13-24] 24 (01/26 0700) BP: (105-143)/(59-82) 143/81 (01/26 0700) SpO2:  [91 %-98 %] 97 % (01/26 0700) Weight:  [76.8 kg] 76.8 kg (01/26 0500)  Filed Weights   01/31/18 0500 02/01/18 0600 02/02/18 0500  Weight: 78.9 kg 78 kg 76.8 kg    Weight change: -1.2 kg   Hemodynamic parameters for last 24 hours:    Intake/Output from previous day: 01/25 0701 - 01/26 0700 In: 1015.2 [P.O.:720; I.V.:295.2] Out: 2725 [Urine:2375; Chest Tube:350]  Intake/Output this shift: No intake/output data recorded.  Current Meds: Scheduled Meds: . acetaminophen  1,000 mg Oral Q6H  . allopurinol  150 mg Oral Daily  . aspirin EC  81 mg Oral Daily  . bisacodyl  10 mg Oral Daily   Or  . bisacodyl  10 mg Rectal Daily  . Chlorhexidine Gluconate Cloth  6 each Topical Daily  . docusate sodium  200 mg Oral Daily  . enoxaparin (LOVENOX) injection  30 mg Subcutaneous Q24H  . furosemide  20 mg Intravenous BID  . mouth rinse  15 mL Mouth Rinse BID  . pantoprazole  40 mg Oral Daily  . sodium chloride flush  10-40 mL Intracatheter Q12H  . tamsulosin  0.8 mg Oral QPC breakfast    . warfarin  2.5 mg Oral q1800  . Warfarin - Physician Dosing Inpatient   Does not apply q1800   Continuous Infusions: . sodium chloride Stopped (01/31/18 0607)  . lactated ringers    . lactated ringers 10 mL/hr at 02/02/18 0217   PRN Meds:.metoprolol tartrate, morphine injection, ondansetron (ZOFRAN) IV, oxyCODONE, potassium chloride, sodium chloride flush, traMADol  General appearance: alert, cooperative and no distress Neurologic: intact Heart: regular rate and rhythm, S1, S2 normal, no murmur, click, rub or gallop Lungs: diminished breath sounds bibasilar Abdomen: soft, non-tender; bowel sounds normal; no masses,  no organomegaly Extremities: extremities normal, atraumatic, no cyanosis or edema and Homans sign is negative, no sign of DVT Wound: Sternum stable Aquacel dressing intact  Lab Results: CBC: Recent Labs    02/01/18 0403 02/02/18 0250  WBC 11.7* 12.2*  HGB 8.4* 9.3*  HCT 25.2* 28.5*  PLT 77* 82*   BMET:  Recent Labs    02/01/18 0403 02/02/18 0250  NA 138 138  K 4.1 3.5  CL 108 103  CO2 25 27  GLUCOSE 120* 154*  BUN 17 19  CREATININE 0.94 0.80  CALCIUM 8.0* 8.5*    CMET: Lab Results  Component Value Date   WBC 12.2 (H) 02/02/2018   HGB 9.3 (L) 02/02/2018   HCT 28.5 (L) 02/02/2018   PLT 82 (L) 02/02/2018   GLUCOSE 154 (H) 02/02/2018   CHOL 87 10/25/2016   TRIG 54 10/25/2016   HDL 26 (L) 10/25/2016   LDLCALC 50 10/25/2016   ALT 23 01/20/2018   AST 25 01/20/2018   NA 138 02/02/2018   K 3.5 02/02/2018   CL 103 02/02/2018   CREATININE 0.80 02/02/2018   BUN 19 02/02/2018   CO2 27 02/02/2018   TSH 1.920 05/02/2017   INR 1.28 02/02/2018   HGBA1C 5.9 (H) 01/20/2018      PT/INR:  Recent Labs    02/02/18 0250  LABPROT 15.9*  INR 1.28   Radiology: Dg Chest Port 1 View  Result Date: 02/02/2018 CLINICAL DATA:  Chest tube in place. EXAM: PORTABLE CHEST 1 VIEW COMPARISON:  Chest x-rays dated 02/01/2018 and 01/31/2018. FINDINGS: Bibasilar  chest tubes are stable in position. Heart size and mediastinal contours are stable. Mild bibasilar atelectasis is unchanged. No discrete pneumothorax appreciated on today's exam. IMPRESSION: Stable bibasilar atelectasis. Bibasilar chest tubes. No pneumothorax appreciated on today's exam. Electronically Signed   By: Franki Cabot M.D.   On: 02/02/2018 06:33     Assessment/Plan: S/P Procedure(s) (LRB): STERNOTOMY (N/A) MITRAL VALVE REPAIR (MVR) USING CARBOMEDICS ANNULOFLEX SIZE 28 (N/A) MAZE (N/A) CLIPPING OF LEFT ATRIAL APPENDAGE USING ATRICLIP PRO2 45MM (N/A) TRICUSPID VALVE REPAIR USING MC3 SIZE 28 (N/A) Underlying rhythm remains junctional rate of 55, currently DDD paced rate decreased to 72 Plan to DC chest tubes today Continue mild diuretic, 20 mg IV twice today Monitor for urinary retention plts increasing  On coumadin inr 1.28 .after  Two doses of coumadin 2.5   Grace Isaac 02/02/2018 8:42 AM

## 2018-02-03 ENCOUNTER — Inpatient Hospital Stay (HOSPITAL_COMMUNITY): Payer: Medicare Other

## 2018-02-03 LAB — CBC
HCT: 24.4 % — ABNORMAL LOW (ref 39.0–52.0)
Hemoglobin: 8.2 g/dL — ABNORMAL LOW (ref 13.0–17.0)
MCH: 33.5 pg (ref 26.0–34.0)
MCHC: 33.6 g/dL (ref 30.0–36.0)
MCV: 99.6 fL (ref 80.0–100.0)
Platelets: 88 10*3/uL — ABNORMAL LOW (ref 150–400)
RBC: 2.45 MIL/uL — ABNORMAL LOW (ref 4.22–5.81)
RDW: 13.3 % (ref 11.5–15.5)
WBC: 8 10*3/uL (ref 4.0–10.5)
nRBC: 0 % (ref 0.0–0.2)

## 2018-02-03 LAB — PROTIME-INR
INR: 1.21
Prothrombin Time: 15.2 seconds (ref 11.4–15.2)

## 2018-02-03 LAB — BASIC METABOLIC PANEL
Anion gap: 5 (ref 5–15)
BUN: 16 mg/dL (ref 8–23)
CO2: 28 mmol/L (ref 22–32)
Calcium: 7.9 mg/dL — ABNORMAL LOW (ref 8.9–10.3)
Chloride: 106 mmol/L (ref 98–111)
Creatinine, Ser: 0.88 mg/dL (ref 0.61–1.24)
GFR calc Af Amer: >60 mL/min (ref >60)
GFR calc non Af Amer: >60 mL/min (ref >60)
Glucose, Bld: 139 mg/dL — ABNORMAL HIGH (ref 70–99)
Potassium: 3.4 mmol/L — ABNORMAL LOW (ref 3.5–5.1)
Sodium: 139 mmol/L (ref 135–145)

## 2018-02-03 MED ORDER — FA-PYRIDOXINE-CYANOCOBALAMIN 2.5-25-2 MG PO TABS
1.0000 | ORAL_TABLET | Freq: Every day | ORAL | Status: DC
  Administered 2018-02-03 – 2018-02-05 (×3): 1 via ORAL
  Filled 2018-02-03 (×4): qty 1

## 2018-02-03 MED ORDER — POTASSIUM CHLORIDE CRYS ER 20 MEQ PO TBCR: 40.0000 meq | EXTENDED_RELEASE_TABLET | Freq: Two times a day (BID) | ORAL | Status: DC

## 2018-02-03 MED ORDER — MOVING RIGHT ALONG BOOK
Freq: Once | Status: AC
  Administered 2018-02-03: 09:00:00
  Filled 2018-02-03: qty 1

## 2018-02-03 MED ORDER — FUROSEMIDE 10 MG/ML IJ SOLN
40.0000 mg | Freq: Once | INTRAMUSCULAR | Status: AC
  Administered 2018-02-03: 40 mg via INTRAVENOUS
  Filled 2018-02-03: qty 4

## 2018-02-03 MED ORDER — POTASSIUM CHLORIDE 10 MEQ/50ML IV SOLN
10.0000 meq | INTRAVENOUS | Status: AC
  Administered 2018-02-03 (×6): 10 meq via INTRAVENOUS
  Filled 2018-02-03 (×5): qty 50

## 2018-02-03 MED ORDER — WARFARIN SODIUM 5 MG PO TABS
5.0000 mg | ORAL_TABLET | Freq: Every day | ORAL | Status: DC
  Administered 2018-02-03 – 2018-02-04 (×2): 5 mg via ORAL
  Filled 2018-02-03 (×2): qty 1

## 2018-02-03 MED ORDER — POTASSIUM CHLORIDE 10 MEQ/50ML IV SOLN
INTRAVENOUS | Status: AC
  Administered 2018-02-03: 10 meq via INTRAVENOUS
  Filled 2018-02-03: qty 50

## 2018-02-03 MED ORDER — POTASSIUM CHLORIDE CRYS ER 20 MEQ PO TBCR
20.0000 meq | EXTENDED_RELEASE_TABLET | ORAL | Status: DC
  Administered 2018-02-03: 20 meq via ORAL
  Filled 2018-02-03: qty 1

## 2018-02-03 NOTE — Progress Notes (Signed)
1500 Checked on pt to see if we could walk. RN getting ready to put in new IV. Also pt got a little weak earlier with RN when attempting second walk. Encouraged pt to use IS. Will continue to follow.Graylon Good RN BSN 02/03/2018 3:17 PM

## 2018-02-03 NOTE — Progress Notes (Addendum)
TCTS DAILY ICU PROGRESS NOTE                   [address]            [place] [address]          [phone]   4 Days Post-Op Procedure(s) (LRB): STERNOTOMY (N/A) MITRAL VALVE REPAIR (MVR) USING CARBOMEDICS ANNULOFLEX SIZE 28 (N/A) MAZE (N/A) CLIPPING OF LEFT ATRIAL APPENDAGE USING ATRICLIP PRO2 45MM (N/A) TRICUSPID VALVE REPAIR USING MC3 SIZE 28 (N/A)  Total Length of Stay:  LOS: 4 days   Subjective: Patient sitting in chair. He has not had a bowel movement  but does not want laxative yet.   Objective: Vital signs in last 24 hours: Temp:  [98.3 F (36.8 C)-99.6 F (37.6 C)] 99.6 F (37.6 C) (01/27 0400) Pulse Rate:  [70-82] 79 (01/27 0700) Cardiac Rhythm: A-V Sequential paced (01/27 0400) Resp:  [13-27] 26 (01/27 0700) BP: (97-142)/(45-82) 142/82 (01/27 0700) SpO2:  [91 %-100 %] 98 % (01/27 0700) Weight:  [75.7 kg] 75.7 kg (01/27 0500)  Filed Weights   02/01/18 0600 02/02/18 0500 02/03/18 0500  Weight: 78 kg 76.8 kg 75.7 kg    Weight change: -1.1 kg      Intake/Output from previous day: 01/26 0701 - 01/27 0700 In: 520 [P.O.:440; I.V.:20] Out: 2070 [Urine:2050; Chest Tube:20]  Intake/Output this shift: No intake/output data recorded.  Current Meds: Scheduled Meds: . acetaminophen  1,000 mg Oral Q6H  . allopurinol  150 mg Oral Daily  . aspirin EC  81 mg Oral Daily  . bisacodyl  10 mg Oral Daily   Or  . bisacodyl  10 mg Rectal Daily  . Chlorhexidine Gluconate Cloth  6 each Topical Daily  . docusate sodium  200 mg Oral Daily  . enoxaparin (LOVENOX) injection  30 mg Subcutaneous Q24H  . finasteride  5 mg Oral Daily  . furosemide  20 mg Intravenous BID  . mouth rinse  15 mL Mouth Rinse BID  . pantoprazole  40 mg Oral Daily  . potassium chloride  20 mEq Oral Q4H  . sodium chloride flush  10-40 mL Intracatheter Q12H  . tamsulosin  0.8 mg Oral QPC breakfast  . warfarin  2.5 mg Oral q1800  . Warfarin - Physician Dosing Inpatient   Does  not apply q1800   Continuous Infusions: . sodium chloride Stopped (01/31/18 0607)  . lactated ringers    . lactated ringers 10 mL/hr at 02/02/18 0217   PRN Meds:.metoprolol tartrate, morphine injection, ondansetron (ZOFRAN) IV, oxyCODONE, sodium chloride flush, traMADol  General appearance: alert, cooperative and no distress Neurologic: intact Heart: Paced Lungs: Slightly diminished at bases Abdomen: Soft, non tender, sporadic bowel sounds Extremities: Trace LE edema. Feet warm bilaterally and pulses intact Wound: Sternal wound is clean and dry. Right groin wound is clean and dry, no hematoma.   Lab Results: CBC: Recent Labs    02/01/18 0403 02/02/18 0250  WBC 11.7* 12.2*  HGB 8.4* 9.3*  HCT 25.2* 28.5*  PLT 77* 82*   BMET:  Recent Labs    02/02/18 0250 02/03/18 0457  NA 138 139  K 3.5 3.4*  CL 103 106  CO2 27 28  GLUCOSE 154* 139*  BUN 19 16  CREATININE 0.80 0.88  CALCIUM 8.5* 7.9*    CMET: Lab Results  Component Value Date   WBC 12.2 (H) 02/02/2018   HGB 9.3 (L) 02/02/2018   HCT 28.5 (L) 02/02/2018   PLT  82 (L) 02/02/2018   GLUCOSE 139 (H) 02/03/2018   CHOL 87 10/25/2016   TRIG 54 10/25/2016   HDL 26 (L) 10/25/2016   LDLCALC 50 10/25/2016   ALT 23 01/20/2018   AST 25 01/20/2018   NA 139 02/03/2018   K 3.4 (L) 02/03/2018   CL 106 02/03/2018   CREATININE 0.88 02/03/2018   BUN 16 02/03/2018   CO2 28 02/03/2018   TSH 1.920 05/02/2017   INR 1.21 02/03/2018   HGBA1C 5.9 (H) 01/20/2018      PT/INR:  Recent Labs    02/03/18 0457  LABPROT 15.2  INR 1.21   Radiology: No results found.   Assessment/Plan: S/P Procedure(s) (LRB): STERNOTOMY (N/A) MITRAL VALVE REPAIR (MVR) USING CARBOMEDICS ANNULOFLEX SIZE 28 (N/A) MAZE (N/A) CLIPPING OF LEFT ATRIAL APPENDAGE USING ATRICLIP PRO2 45MM (N/A) TRICUSPID VALVE REPAIR USING MC3 SIZE 28 (N/A)  1. CV- Remains AV paced.  Hopefully, conduction system will recover on its own and he will not need PPM. On  Coumadin. INR this am decreased from 1.28 to 1.21. Will increase Coumadin. 2. Pulmonary-On room air. CXR this am appears stable. Encourage incentive spirometer and flutter valve. 3. ABL anemia-Last H and H stable at 9.3 and 28.5. Await this am's result.  4. Volume overload-will give Lasix 40 mg IV this am 5. Supplement potassium 6. Thrombocytopenia-last platelets 82,000. Await am's result 7. Urinary retention-on Flomax and Proscar started yesterday. Bladder scan this am showed over 800 cc. Will discuss with Dr. Roxy Manns if should in and out cath vs replacing foley.  Donielle Liston Alba PA-C 02/03/2018 7:33 AM    I have seen and examined the patient and agree with the assessment and plan as outlined.  Overall looks good.  Junctional rhythm 50-55 under pacer - will switch to VVI backup and observe.  Just voided 300 mL - hopefully urinary retention is resolving.  Mobilize.  Transfer 4E  Rexene Alberts, MD 02/03/2018 8:37 AM

## 2018-02-03 NOTE — Progress Notes (Addendum)
TCTS BRIEF SICU PROGRESS NOTE  4 Days Post-Op  S/P Procedure(s) (LRB): STERNOTOMY (N/A) MITRAL VALVE REPAIR (MVR) USING CARBOMEDICS ANNULOFLEX SIZE 28 (N/A) MAZE (N/A) CLIPPING OF LEFT ATRIAL APPENDAGE USING ATRICLIP PRO2 45MM (N/A) TRICUSPID VALVE REPAIR USING MC3 SIZE 28 (N/A)   Stable day Rhythm stable Voided urine several times  Plan: Awaiting bed on 4E for transfer  Rexene Alberts, MD 02/03/2018 5:11 PM

## 2018-02-04 ENCOUNTER — Inpatient Hospital Stay (HOSPITAL_COMMUNITY): Payer: Medicare Other

## 2018-02-04 LAB — PROTIME-INR
INR: 1.21
Prothrombin Time: 15.2 seconds (ref 11.4–15.2)

## 2018-02-04 LAB — TYPE AND SCREEN
ABO/RH(D): O POS
Antibody Screen: NEGATIVE
Unit division: 0
Unit division: 0
Unit division: 0
Unit division: 0

## 2018-02-04 LAB — BASIC METABOLIC PANEL
Anion gap: 9 (ref 5–15)
BUN: 15 mg/dL (ref 8–23)
CO2: 26 mmol/L (ref 22–32)
Calcium: 8.2 mg/dL — ABNORMAL LOW (ref 8.9–10.3)
Chloride: 105 mmol/L (ref 98–111)
Creatinine, Ser: 0.71 mg/dL (ref 0.61–1.24)
GFR calc Af Amer: >60 mL/min (ref >60)
GFR calc non Af Amer: >60 mL/min (ref >60)
Glucose, Bld: 121 mg/dL — ABNORMAL HIGH (ref 70–99)
Potassium: 4 mmol/L (ref 3.5–5.1)
Sodium: 140 mmol/L (ref 135–145)

## 2018-02-04 LAB — BPAM RBC
Blood Product Expiration Date: 202002222359
Blood Product Expiration Date: 202002222359
Blood Product Expiration Date: 202002222359
Blood Product Expiration Date: 202002232359
ISSUE DATE / TIME: 202001230916
ISSUE DATE / TIME: 202001230916
Unit Type and Rh: 5100
Unit Type and Rh: 5100
Unit Type and Rh: 5100
Unit Type and Rh: 5100

## 2018-02-04 MED ORDER — POTASSIUM CHLORIDE CRYS ER 20 MEQ PO TBCR
20.0000 meq | EXTENDED_RELEASE_TABLET | Freq: Every day | ORAL | Status: DC
  Administered 2018-02-04 – 2018-02-05 (×2): 20 meq via ORAL
  Filled 2018-02-04 (×2): qty 1

## 2018-02-04 MED ORDER — GLUCERNA SHAKE PO LIQD: 237.0000 mL | Freq: Three times a day (TID) | ORAL | Status: DC

## 2018-02-04 MED ORDER — COUMADIN BOOK
Freq: Once | Status: AC
  Administered 2018-02-04: 10:00:00
  Filled 2018-02-04: qty 1

## 2018-02-04 MED ORDER — FUROSEMIDE 40 MG PO TABS
40.0000 mg | ORAL_TABLET | Freq: Every day | ORAL | Status: DC
  Administered 2018-02-04 – 2018-02-05 (×2): 40 mg via ORAL
  Filled 2018-02-04 (×2): qty 1

## 2018-02-04 MED FILL — Heparin Sodium (Porcine) Inj 1000 Unit/ML: INTRAMUSCULAR | Qty: 10 | Status: AC

## 2018-02-04 MED FILL — Sodium Bicarbonate IV Soln 8.4%: INTRAVENOUS | Qty: 50 | Status: AC

## 2018-02-04 MED FILL — Lidocaine HCl(Cardiac) IV PF Soln Pref Syr 100 MG/5ML (2%): INTRAVENOUS | Qty: 25 | Status: AC

## 2018-02-04 MED FILL — Electrolyte-R (PH 7.4) Solution: INTRAVENOUS | Qty: 4000 | Status: AC

## 2018-02-04 MED FILL — Heparin Sodium (Porcine) Inj 1000 Unit/ML: INTRAMUSCULAR | Qty: 30 | Status: AC

## 2018-02-04 MED FILL — Calcium Chloride Inj 10%: INTRAVENOUS | Qty: 10 | Status: AC

## 2018-02-04 MED FILL — Mannitol IV Soln 20%: INTRAVENOUS | Qty: 1000 | Status: AC

## 2018-02-04 MED FILL — Sodium Chloride IV Soln 0.9%: INTRAVENOUS | Qty: 3000 | Status: AC

## 2018-02-04 MED FILL — Potassium Chloride Inj 2 mEq/ML: INTRAVENOUS | Qty: 40 | Status: AC

## 2018-02-04 MED FILL — Magnesium Sulfate Inj 50%: INTRAMUSCULAR | Qty: 10 | Status: AC

## 2018-02-04 NOTE — Care Management Important Message (Signed)
Important Message  Patient Details  Name: [name]. MRN: [id number] Date of Birth: 1935-12-30   Medicare Important Message Given:  Yes    Tommy Medal 02/04/2018, 11:17 AM

## 2018-02-04 NOTE — Progress Notes (Signed)
CARDIAC REHAB PHASE I   PRE:  Rate/Rhythm: 57     BP: sitting 152/70    SaO2: 98 RA  MODE:  Ambulation: 240 ft   POST:  Rate/Rhythm: 63    BP: sitting 122/37     SaO2: 98 RA  Pt needed assist to roll to EOB. Able to stand and walk with RW. Pt c/o general weakness and fatigue with distance. To chair. BP significantly lower so I wonder if he is somewhat orthostatic. Reminded to be with staff when up in room. Will f/u tomorrow. [place], ACSM 02/04/2018 3:19 PM

## 2018-02-04 NOTE — Progress Notes (Addendum)
      [address]       [place] [address]             [phone]        5 Days Post-Op Procedure(s) (LRB): STERNOTOMY (N/A) MITRAL VALVE REPAIR (MVR) USING CARBOMEDICS ANNULOFLEX SIZE 28 (N/A) MAZE (N/A) CLIPPING OF LEFT ATRIAL APPENDAGE USING ATRICLIP PRO2 45MM (N/A) TRICUSPID VALVE REPAIR USING MC3 SIZE 28 (N/A)  Subjective: Patient had a bowel movement yesterday. He is concerned he has lost a fair amount of weight and his appetite is "so so". He denies abdominal pain or nausea.  Objective: Vital signs in last 24 hours: Temp:  [98.3 F (36.8 C)-99.5 F (37.5 C)] 99.4 F (37.4 C) (01/28 0400) Pulse Rate:  [45-78] 49 (01/28 0700) Cardiac Rhythm: Ventricular paced (01/28 0400) Resp:  [12-21] 14 (01/28 0700) BP: (96-140)/(52-79) 140/65 (01/28 0600) SpO2:  [91 %-100 %] 95 % (01/28 0700)  Pre op weight 73.9 kg Current Weight  02/03/18 75.7 kg      Intake/Output from previous day: 01/27 0701 - 01/28 0700 In: 540 [P.O.:240; IV Piggyback:300] Out: 1000 [Urine:1000]   Physical Exam:  Cardiovascular: No murmur Pulmonary: Slightly diminished a bases Abdomen: Soft, non tender, bowel sounds present. Extremities: Trace bilateral lower extremity edema. Wounds: Clean and dry.  No erythema or signs of infection. Right groin is clean and dry, no hematoma.  Lab Results: CBC: Recent Labs    02/02/18 0250 02/03/18 0457  WBC 12.2* 8.0  HGB 9.3* 8.2*  HCT 28.5* 24.4*  PLT 82* 88*   BMET:  Recent Labs    02/03/18 0457 02/04/18 0321  NA 139 140  K 3.4* 4.0  CL 106 105  CO2 28 26  GLUCOSE 139* 121*  BUN 16 15  CREATININE 0.88 0.71  CALCIUM 7.9* 8.2*    PT/INR:  Lab Results  Component Value Date   INR 1.21 02/04/2018   INR 1.21 02/03/2018   INR 1.28 02/02/2018   ABG:  INR: Will add last result for INR, ABG once components are confirmed Will add last 4 CBG results once components are confirmed  Assessment/Plan: 1. CV- Was AV paced in  the ICU and underlying rhythm was junctional with HR in the 50's. He is on back up V pacing at 50 this am;appears junctional with HR 51-53. Hopefully, conduction system will recover on its own and he will not need PPM. On Coumadin. INR this am remains 1.21. Will continue with 5 mg of Coumadin as will see first result in am. 2. Pulmonary-On room air. CXR this am appears stable (no pneumothorax, small left pleural effusion and atelectasis). Encourage incentive spirometer and flutter valve. 3. ABL anemia- H and H this am  8.2 and 24.4. Continue Foltx. 4. Volume overload-will give Lasix 40 mg po this am 6. Thrombocytopenia- platelets this am up to 88,000. 7. Urinary retention-on Flomax and Proscar. He is voiding on his own. 8. GI-encourage po, Glucerna. I discussed with him may take awhile for appetite to return to normal. Most of his weight loss has been fluid.   Donielle M ZimmermanPA-C 02/04/2018,7:52 AM [phone]   I have seen and examined the patient and agree with the assessment and plan as outlined.  Rexene Alberts, MD 02/04/2018 8:26 AM

## 2018-02-04 NOTE — Progress Notes (Signed)
Patient transferring to 4E23. Report given to Charlotte Crumb, RN. Per 2H charge nurse, patient to be transferred with x-ray tech.

## 2018-02-04 NOTE — Progress Notes (Signed)
Epicardial pacing wires removed per MD order.  Will continue to monitor.

## 2018-02-04 NOTE — Discharge Instructions (Signed)
Discharge Instructions:  1. You may shower, please wash incisions daily with soap and water and keep dry.  If you wish to cover wounds with dressing you may do so but please keep clean and change daily.  No tub baths or swimming until incisions have completely healed.  If your incisions become red or develop any drainage please call our office at [phone]  2. No Driving until cleared by Dr. Guy Sandifer office and you are no longer using narcotic pain medications  3. Monitor your weight daily.. Please use the same scale and weigh at same time... If you gain 5-10 lbs in 48 hours with associated lower extremity swelling, please contact our office at [phone]  4. Fever of 101.5 for at least 24 hours with no source, please contact our office at [phone]  5. Activity- up as tolerated, please walk at least 3 times per day.  Avoid strenuous activity, no lifting, pushing, or pulling with your arms over 8-10 lbs for a minimum of 6 weeks  6. If any questions or concerns arise, please do not hesitate to contact our office at [phone]   Vitamin K Foods and Warfarin Warfarin is a blood thinner (anticoagulant). Anticoagulant medicines help prevent the formation of blood clots. These medicines work by decreasing the activity of vitamin K, which promotes normal blood clotting. When you take warfarin, problems can occur from suddenly increasing or decreasing the amount of vitamin K that you eat from one day to the next. Problems may include:  Blood clots.  Bleeding. What general guidelines do I need to follow? To avoid problems when taking warfarin:  Eat a balanced diet that includes: ? Fresh fruits and vegetables. ? Whole grains. ? Low-fat dairy products. ? Lean proteins, such as fish, eggs, and lean cuts of meat.  Keep your intake of vitamin K consistent from day to day. To do this: ? Avoid eating large amounts of vitamin K one day and low amounts of vitamin K the next day. ? If you take  a multivitamin that contains vitamin K, be sure to take it every day. ? Know which foods contain vitamin K. Use the lists below to understand serving sizes and the amount of vitamin K in one serving.  Avoid major changes in your diet. If you are going to change your diet, talk with your health care provider before making changes.  Work with a Financial planner (dietitian) to develop a meal plan that works best for you.  High vitamin K foods Foods that are high in vitamin K contain more than 100 mcg (micrograms) per serving. These include:  Broccoli (cooked) -  cup has 110 mcg.  Brussels sprouts (cooked) -  cup has 109 mcg.  Greens, beet (cooked) -  cup has 350 mcg.  Greens, collard (cooked) -  cup has 418 mcg.  Greens, turnip (cooked) -  cup has 265 mcg.  Green onions or scallions -  cup has 105 mcg.  Kale (fresh or frozen) -  cup has 531 mcg.  Parsley (raw) - 10 sprigs has 164 mcg.  Spinach (cooked) -  cup has 444 mcg.  Swiss chard (cooked) -  cup has 287 mcg. Moderate vitamin K foods Foods that have a moderate amount of vitamin K contain 25-100 mcg per serving. These include:  Asparagus (cooked) - 5 spears have 38 mcg.  Black-eyed peas (dried) -  cup has 32 mcg.  Cabbage (cooked) -  cup has 37 mcg.  Kiwi fruit - 1 medium has  31 mcg.  Lettuce - 1 cup has 57-63 mcg.  Okra (frozen) -  cup has 44 mcg.  Prunes (dried) - 5 prunes have 25 mcg.  Watercress (raw) - 1 cup has 85 mcg. Low vitamin K foods Foods low in vitamin K contain less than 25 mcg per serving. These include:  Artichoke - 1 medium has 18 mcg.  Avocado - 1 oz. has 6 mcg.  Blueberries -  cup has 14 mcg.  Cabbage (raw) -  cup has 21 mcg.  Carrots (cooked) -  cup has 11 mcg.  Cauliflower (raw) -  cup has 11 mcg.  Cucumber with peel (raw) -  cup has 9 mcg.  Grapes -  cup has 12 mcg.  Mango - 1 medium has 9 mcg.  Nuts - 1 oz. has 15 mcg.  Pear - 1 medium has 8  mcg.  Peas (cooked) -  cup has 19 mcg.  Pickles - 1 spear has 14 mcg.  Pumpkin seeds - 1 oz. has 13 mcg.  Sauerkraut (canned) -  cup has 16 mcg.  Soybeans (cooked) -  cup has 16 mcg.  Tomato (raw) - 1 medium has 10 mcg.  Tomato sauce -  cup has 17 mcg. Vitamin K-free foods If a food contain less than 5 mcg per serving, it is considered to have no vitamin K. These foods include:  Bread and cereal products.  Cheese.  Eggs.  Fish and shellfish.  Meat and poultry.  Milk and dairy products.  Sunflower seeds. Actual amounts of vitamin K in foods may be different depending on processing. Talk with your dietitian about what foods you can eat and what foods you should avoid. This information is not intended to replace advice given to you by your health care provider. Make sure you discuss any questions you have with your health care provider. Document Released: 10/22/2008 Document Revised: 07/17/2015 Document Reviewed: 03/30/2015 Elsevier Interactive Patient Education  2019 [place] on my medicine - Coumadin   (Warfarin)  Why was Coumadin prescribed for you? Coumadin was prescribed for you because you have a blood clot or a medical condition that can cause an increased risk of forming blood clots. Blood clots can cause serious health problems by blocking the flow of blood to the heart, lung, or brain. Coumadin can prevent harmful blood clots from forming. As a reminder your indication for Coumadin is:   Blood Clot Prevention After Heart Valve Surgery  What test will check on my response to Coumadin? While on Coumadin (warfarin) you will need to have an INR test regularly to ensure that your dose is keeping you in the desired range. The INR (international normalized ratio) number is calculated from the result of the laboratory test called prothrombin time (PT).  If an INR APPOINTMENT HAS NOT ALREADY BEEN MADE FOR YOU please schedule an appointment to have  this lab work done by your health care provider within 7 days. Your INR goal is usually a number between:  2 to 3 or your provider may give you a more narrow range like 2-2.5.  Ask your health care provider during an office visit what your goal INR is.  What  do you need to  know  About  COUMADIN? Take Coumadin (warfarin) exactly as prescribed by your healthcare provider about the same time each day.  DO NOT stop taking without talking to the doctor who prescribed the medication.  Stopping without other blood clot prevention medication to take  the place of Coumadin may increase your risk of developing a new clot or stroke.  Get refills before you run out.  What do you do if you miss a dose? If you miss a dose, take it as soon as you remember on the same day then continue your regularly scheduled regimen the next day.  Do not take two doses of Coumadin at the same time.  Important Safety Information A possible side effect of Coumadin (Warfarin) is an increased risk of bleeding. You should call your healthcare provider right away if you experience any of the following: ? Bleeding from an injury or your nose that does not stop. ? Unusual colored urine (red or dark brown) or unusual colored stools (red or black). ? Unusual bruising for unknown reasons. ? A serious fall or if you hit your head (even if there is no bleeding).  Some foods or medicines interact with Coumadin (warfarin) and might alter your response to warfarin. To help avoid this: ? Eat a balanced diet, maintaining a consistent amount of Vitamin K. ? Notify your provider about major diet changes you plan to make. ? Avoid alcohol or limit your intake to 1 drink for women and 2 drinks for men per day. (1 drink is 5 oz. wine, 12 oz. beer, or 1.5 oz. liquor.)  Make sure that ANY health care provider who prescribes medication for you knows that you are taking Coumadin (warfarin).  Also make sure the healthcare provider who is monitoring  your Coumadin knows when you have started a new medication including herbals and non-prescription products.  Coumadin (Warfarin)  Major Drug Interactions  Increased Warfarin Effect Decreased Warfarin Effect  Alcohol (large quantities) Antibiotics (esp. Septra/Bactrim, Flagyl, Cipro) Amiodarone (Cordarone) Aspirin (ASA) Cimetidine (Tagamet) Megestrol (Megace) NSAIDs (ibuprofen, naproxen, etc.) Piroxicam (Feldene) Propafenone (Rythmol SR) Propranolol (Inderal) Isoniazid (INH) Posaconazole (Noxafil) Barbiturates (Phenobarbital) Carbamazepine (Tegretol) Chlordiazepoxide (Librium) Cholestyramine (Questran) Griseofulvin Oral Contraceptives Rifampin Sucralfate (Carafate) Vitamin K   Coumadin (Warfarin) Major Herbal Interactions  Increased Warfarin Effect Decreased Warfarin Effect  Garlic Ginseng Ginkgo biloba Coenzyme Q10 Green tea [place] wort    Coumadin (Warfarin) FOOD Interactions  Eat a consistent number of servings per week of foods HIGH in Vitamin K (1 serving =  cup)  Collards (cooked, or boiled & drained) Kale (cooked, or boiled & drained) Mustard greens (cooked, or boiled & drained) Parsley *serving size only =  cup Spinach (cooked, or boiled & drained) Swiss chard (cooked, or boiled & drained) Turnip greens (cooked, or boiled & drained)  Eat a consistent number of servings per week of foods MEDIUM-HIGH in Vitamin K (1 serving = 1 cup)  Asparagus (cooked, or boiled & drained) Broccoli (cooked, boiled & drained, or raw & chopped) Brussel sprouts (cooked, or boiled & drained) *serving size only =  cup Lettuce, raw (green leaf, endive, romaine) Spinach, raw Turnip greens, raw & chopped   These websites have more information on Coumadin (warfarin):  FailFactory.se; VeganReport.com.au;

## 2018-02-05 LAB — CBC
HCT: 27.7 % — ABNORMAL LOW (ref 39.0–52.0)
Hemoglobin: 8.9 g/dL — ABNORMAL LOW (ref 13.0–17.0)
MCH: 32 pg (ref 26.0–34.0)
MCHC: 32.1 g/dL (ref 30.0–36.0)
MCV: 99.6 fL (ref 80.0–100.0)
Platelets: 133 K/uL — ABNORMAL LOW (ref 150–400)
RBC: 2.78 MIL/uL — ABNORMAL LOW (ref 4.22–5.81)
RDW: 13.5 % (ref 11.5–15.5)
WBC: 7.5 K/uL (ref 4.0–10.5)
nRBC: 0.3 % — ABNORMAL HIGH (ref 0.0–0.2)

## 2018-02-05 LAB — PROTIME-INR
INR: 1.33
Prothrombin Time: 16.4 seconds — ABNORMAL HIGH (ref 11.4–15.2)

## 2018-02-05 MED ORDER — LISINOPRIL 5 MG PO TABS
5.0000 mg | ORAL_TABLET | Freq: Every day | ORAL | Status: DC
  Administered 2018-02-05: 5 mg via ORAL
  Filled 2018-02-05: qty 1

## 2018-02-05 MED ORDER — ASPIRIN 81 MG PO TBEC: 81.0000 mg | DELAYED_RELEASE_TABLET | Freq: Every day | ORAL | Status: DC

## 2018-02-05 MED ORDER — LISINOPRIL 5 MG PO TABS: 5.0000 mg | ORAL_TABLET | Freq: Every day | ORAL | 1 refills | Status: DC

## 2018-02-05 MED ORDER — TRAMADOL HCL 50 MG PO TABS: 50.0000 mg | ORAL_TABLET | ORAL | 0 refills | Status: DC | PRN

## 2018-02-05 MED ORDER — WARFARIN SODIUM 5 MG PO TABS: 5.0000 mg | ORAL_TABLET | Freq: Every day | ORAL | 1 refills | Status: DC

## 2018-02-05 NOTE — Progress Notes (Signed)
Ed completed with pt and wife. Good reception. Will refer to [place]. Answered questions.  [place], ACSM 11:54 AM 02/05/2018

## 2018-02-05 NOTE — Progress Notes (Signed)
Discussed with the patient and all questioned fully answered. He will call me if any problems arise.  IV removed. Telemetry discontinued. DME delivered to room prior to discharge. Answered all questions of pt and his wife. Reviewed discharge instructions including medications, warfarin and vitamin K, bathing, follow up appointments.   CT sutures remain in place, pt has appointment scheduled for removal next week.  Fritz Pickerel, RN

## 2018-02-05 NOTE — Research (Signed)
Visit 3 central labs (will not be resulted)

## 2018-02-05 NOTE — Progress Notes (Addendum)
      [address]       ,[place] [address]             [phone]        6 Days Post-Op Procedure(s) (LRB): STERNOTOMY (N/A) MITRAL VALVE REPAIR (MVR) USING CARBOMEDICS ANNULOFLEX SIZE 28 (N/A) MAZE (N/A) CLIPPING OF LEFT ATRIAL APPENDAGE USING ATRICLIP PRO2 45MM (N/A) TRICUSPID VALVE REPAIR USING MC3 SIZE 28 (N/A)  Subjective: Patient without specific complaints this am  Objective: Vital signs in last 24 hours: Temp:  [98.4 F (36.9 C)-99.1 F (37.3 C)] 98.4 F (36.9 C) (01/29 0344) Pulse Rate:  [33-64] 33 (01/29 0344) Cardiac Rhythm: Sinus bradycardia (01/28 2214) Resp:  [17-18] 18 (01/29 0344) BP: (132-155)/(43-77) 155/77 (01/29 0344) SpO2:  [95 %-99 %] 96 % (01/29 0344) Weight:  [73.2 kg] 73.2 kg (01/29 0331)  Pre op weight 73.9 kg Current Weight  02/05/18 73.2 kg      Intake/Output from previous day: 01/28 0701 - 01/29 0700 In: 837 [P.O.:837] Out: 725 [Urine:725]   Physical Exam:  Cardiovascular: IRRR Pulmonary: Slightly diminished a bases Abdomen: Soft, non tender, bowel sounds present. Extremities: No  lower extremity edema. Wounds: Clean and dry.  No erythema or signs of infection. Right groin is clean and dry, no hematoma.  Lab Results: CBC: Recent Labs    02/03/18 0457 02/05/18 0355  WBC 8.0 7.5  HGB 8.2* 8.9*  HCT 24.4* 27.7*  PLT 88* 133*   BMET:  Recent Labs    02/03/18 0457 02/04/18 0321  NA 139 140  K 3.4* 4.0  CL 106 105  CO2 28 26  GLUCOSE 139* 121*  BUN 16 15  CREATININE 0.88 0.71  CALCIUM 7.9* 8.2*    PT/INR:  Lab Results  Component Value Date   INR 1.33 02/05/2018   INR 1.21 02/04/2018   INR 1.21 02/03/2018   ABG:  INR: Will add last result for INR, ABG once components are confirmed Will add last 4 CBG results once components are confirmed  Assessment/Plan: 1. CV- Was AV paced in the ICU and underlying rhythm was junctional with HR in the 50's. Yesterday he was on back up V pacing at 50.  His HR is in the 60-low 70's this am. His BP this am is 147/82. SBP more above 140's of late. Was on Amlodipine prior to surgery, but will start low dose Lisinopril for better BP control. On Coumadin. INR this am slightly increased from 1.21 to 1.33. Will continue with 5 mg of Coumadin as will see first result in am. 2. Pulmonary-On room air. Encourage incentive spirometer and flutter valve. 3. ABL anemia- H and H this am  8.9 and 27.7. On Foltx. 4. Volume overload-on Lasix 40 mg po daily. Will stop after today's dose 6. Thrombocytopenia- platelets this am up to 133,000. 7. Urinary retention resolved-on Flomax and Proscar. He is voiding on his own. 8. GI-encourage po, Glucerna.  9. As discussed with Dr. Roxy Manns, will discharge later today  Donielle M ZimmermanPA-C 02/05/2018,7:43 AM [phone]   I have seen and examined the patient and agree with the assessment and plan as outlined.  Doing well.  D/C home.  Instructions given.  Rexene Alberts, MD 02/05/2018 8:27 AM

## 2018-02-05 NOTE — Research (Addendum)
Address abnormal labs: [facility] Research study screening labs V1:

## 2018-02-05 NOTE — Care Management Note (Signed)
Case Management Note Marvetta Gibbons RN, BSN Transitions of Care Unit 4E- RN Case Manager [phone]   Patient Details  Name: Mason Dibiasio. MRN: [id number] Date of Birth: 06/24/1935  Subjective/Objective:  Pt admitted s/p sternotomy, MVR, MAZE, TVR                  Action/Plan: PTA pt lived at home with spouse, plan to return home with spouse, order placed for DME- RW- call made to [place] with [facility] for DME needs- RW to be delivered to room prior to discharge- no other CM needs noted for transition home.   Expected Discharge Date:  02/05/18               Expected Discharge Plan:  Home/Self Care  In-House Referral:  NA  Discharge planning Services  CM Consult  Post Acute Care Choice:  Durable Medical Equipment Choice offered to:  Patient  DME Arranged:  Gilford Rile rolling DME Agency:  [place]:  NA [place] Agency:     Status of Service:  Completed, signed off  If discussed at [place] of Stay Meetings, dates discussed:    Discharge Disposition: home/self care   Additional Comments:  Dawayne Patricia, RN 02/05/2018, 10:15 AM

## 2018-02-06 ENCOUNTER — Encounter: Payer: Self-pay | Admitting: *Deleted

## 2018-02-06 ENCOUNTER — Encounter: Payer: Self-pay | Admitting: Thoracic Surgery (Cardiothoracic Vascular Surgery)

## 2018-02-06 DIAGNOSIS — Z006 Encounter for examination for normal comparison and control in clinical research program: Secondary | ICD-10-CM

## 2018-02-06 NOTE — Research (Signed)
[place] study 7 day telephone follow up completed. Patient states he is doing well denies chest pain or any other adverse events. I thanked him for his participation and informed him again that he was in the observation cohort of the study. Next research required follow up will be another phone call at 30 days post surgery. Patient verbalized understanding.

## 2018-02-10 ENCOUNTER — Ambulatory Visit: Payer: Medicare Other | Admitting: Nurse Practitioner

## 2018-02-10 ENCOUNTER — Ambulatory Visit: Payer: Medicare Other

## 2018-02-10 DIAGNOSIS — I63411 Cerebral infarction due to embolism of right middle cerebral artery: Secondary | ICD-10-CM

## 2018-02-10 DIAGNOSIS — Z7901 Long term (current) use of anticoagulants: Secondary | ICD-10-CM | POA: Insufficient documentation

## 2018-02-10 DIAGNOSIS — I4811 Longstanding persistent atrial fibrillation: Secondary | ICD-10-CM | POA: Diagnosis not present

## 2018-02-10 DIAGNOSIS — Z9889 Other specified postprocedural states: Secondary | ICD-10-CM | POA: Diagnosis not present

## 2018-02-10 LAB — POCT INR: INR: 1.7 — AB (ref 2.0–3.0)

## 2018-02-10 NOTE — Patient Instructions (Addendum)
  Description   Start taking 1 tablet daily except 1.5 tablets on Mondays and Fridays.  Recheck in 1 week. Coumadin Clinic [phone] call with bleeding problems or new medications.    A full discussion of the nature of anticoagulants has been carried out.  A benefit risk analysis has been presented to the patient, so that they understand the justification for choosing anticoagulation at this time. The need for frequent and regular monitoring, precise dosage adjustment and compliance is stressed.  Side effects of potential bleeding are discussed.  The patient should avoid any OTC items containing aspirin or ibuprofen, and should avoid great swings in general diet.  Avoid alcohol consumption.  Call if any signs of abnormal bleeding.

## 2018-02-12 ENCOUNTER — Encounter (INDEPENDENT_AMBULATORY_CARE_PROVIDER_SITE_OTHER): Payer: Self-pay

## 2018-02-12 ENCOUNTER — Telehealth (HOSPITAL_COMMUNITY): Payer: Self-pay

## 2018-02-12 DIAGNOSIS — Z4802 Encounter for removal of sutures: Secondary | ICD-10-CM

## 2018-02-12 NOTE — Telephone Encounter (Signed)
Called patient to see if he is interested in the Cardiac Rehab Program. Patient expressed interest. Explained scheduling process and went over insurance, patient verbalized understanding. Will contact patient for scheduling once f/u has been completed.  °

## 2018-02-12 NOTE — Telephone Encounter (Signed)
Pt insurance is active and benefits verified through [facility]. Co-pay $20.00, DED $0.00/$0.00 met, out of pocket $3,600.00/$174.00 met, co-insurance 0%. No pre-authorization required. Passport, 02/12/2018 @ 2:04PM, REF# [id number]  Will contact patient to see if he is interested in the Cardiac Rehab Program. If interested, patient will need to complete follow up appt. Once completed, patient will be contacted for scheduling upon review by the RN Navigator.

## 2018-02-17 ENCOUNTER — Ambulatory Visit: Payer: Medicare Other | Admitting: *Deleted

## 2018-02-17 DIAGNOSIS — I4811 Longstanding persistent atrial fibrillation: Secondary | ICD-10-CM

## 2018-02-17 DIAGNOSIS — Z7901 Long term (current) use of anticoagulants: Secondary | ICD-10-CM | POA: Diagnosis not present

## 2018-02-17 DIAGNOSIS — I63411 Cerebral infarction due to embolism of right middle cerebral artery: Secondary | ICD-10-CM | POA: Diagnosis not present

## 2018-02-17 DIAGNOSIS — Z9889 Other specified postprocedural states: Secondary | ICD-10-CM

## 2018-02-17 LAB — POCT INR: INR: 2.5 (ref 2.0–3.0)

## 2018-02-17 NOTE — Patient Instructions (Signed)
Description   Continue taking 1 tablet daily except 1.5 tablets on Mondays and Fridays.  Recheck in 1 week. Coumadin Clinic [phone] call with bleeding problems or new medications.

## 2018-02-25 ENCOUNTER — Ambulatory Visit: Payer: Medicare Other | Admitting: Nurse Practitioner

## 2018-02-25 ENCOUNTER — Encounter: Payer: Self-pay | Admitting: Nurse Practitioner

## 2018-02-25 ENCOUNTER — Ambulatory Visit (INDEPENDENT_AMBULATORY_CARE_PROVIDER_SITE_OTHER): Payer: Medicare Other | Admitting: *Deleted

## 2018-02-25 VITALS — BP 126/82 | HR 92 | Ht 72.0 in | Wt 157.8 lb

## 2018-02-25 DIAGNOSIS — I4819 Other persistent atrial fibrillation: Secondary | ICD-10-CM | POA: Diagnosis not present

## 2018-02-25 DIAGNOSIS — Z7901 Long term (current) use of anticoagulants: Secondary | ICD-10-CM

## 2018-02-25 DIAGNOSIS — I4811 Longstanding persistent atrial fibrillation: Secondary | ICD-10-CM | POA: Diagnosis not present

## 2018-02-25 DIAGNOSIS — Z9889 Other specified postprocedural states: Secondary | ICD-10-CM

## 2018-02-25 DIAGNOSIS — I63411 Cerebral infarction due to embolism of right middle cerebral artery: Secondary | ICD-10-CM

## 2018-02-25 LAB — POCT INR: INR: 2.7 (ref 2.0–3.0)

## 2018-02-25 NOTE — Patient Instructions (Signed)
Description   Continue taking 1 tablet daily except 1.5 tablets on Mondays and Fridays.  Recheck in 1 week. Coumadin Clinic [phone] call with bleeding problems or new medications.

## 2018-02-25 NOTE — Patient Instructions (Addendum)
We will be checking the following labs today - BMET & CBC   Medication Instructions:    Continue with your current medicines.    If you need a refill on your cardiac medications before your next appointment, please call your pharmacy.     Testing/Procedures To Be Arranged:  Echocardiogram  Follow-Up:   See me in one month with EKG    At [facility], you and your health needs are our priority.  As part of our continuing mission to provide you with exceptional heart care, we have created designated Provider Care Teams.  These Care Teams include your primary Cardiologist (physician) and Advanced Practice Providers (APPs -  Physician Assistants and Nurse Practitioners) who all work together to provide you with the care you need, when you need it.  Special Instructions:  . Think about what we talked about today.  . Do not overdo . Try to increase calories . No weights or punching bag at this time.   Call the [place] office at [phone] if you have any questions, problems or concerns.

## 2018-02-25 NOTE — Progress Notes (Addendum)
CARDIOLOGY OFFICE NOTE  Date:  02/25/2018    Gerald Ape Weichel Jr. Date of Birth: 05-25-1935 Medical Record #[id number]  PCP:  Leighton Ruff, MD  Cardiologist:  Marisa Cyphers    Chief Complaint  Patient presents with  . Follow-up    Post op visit - seen for Dr. Marlou Porch    History of Present Illness: [name]. is a 83 y.o. male who presents today for a post hospital visit. Seen for Dr. Marlou Porch. Primarily follows with me. Has seen Dr. Burt Knack for structural heart.   [name] has a history of a prior aortic valve replacement using abioprosthetictissue valve in 2012,hypertension, atrial fibrillation on long-term anticoagulation, and previous embolic stroke in 5427.    Patient's cardiac history dates back to 2012 when he underwent aortic valve replacement using a13mm Edwards Magna Easestented bovine pericardial tissue valve via right minithoracotomy approach for severe aortic insufficiency. He recovered uneventfully and was followed for a period of time by Dr. Marlou Porch but subsequently lost to follow-up. He has been followed since 2018 by Truitt Merle, and previous echocardiograms document the presence of normal functioning bioprosthetic tissue valve in aortic position with mild mitral regurgitation and normal left ventricular systolic function. In October 2018 he had an acute right middle cerebral artery stroke associated with dense left-sided hemiplegia. He was promptly treated with catheter directed therapy and he recovered remarkably well. Transthoracic echocardiogram performed at that time revealed normal left ventricular function with normal functioning bioprosthetic tissue valve in aortic position and what was felt to be moderate mitral regurgitation. The patient was notably in atrial fibrillation. Transesophageal echocardiogram was not performed but the patient was started on Eliquis for long-term anticoagulation. He recovered remarkably well from his  stroke and has eventually returned to essentially baseline physical status.He did report decreased exercise tolerance with poor endurance, and he admitted to some exertional shortness of breath with more strenuous activity. However, he continued to walk every day and he exercised religiously, including doing push-ups and lifting some weights. Recent follow-up echocardiogram revealed preserved left ventricular function but worsening mitral regurgitation. He was referred to Dr. Burt Knack for consultation and underwent transesophageal echocardiogram and diagnostic cardiac catheterization on October 14, 2017. TEE revealed normal left ventricular systolic function with normal functioning bioprosthetic tissue valve in aortic position. There was severe mitral regurgitation with mitral valve prolapse including a flail segment involving the middle scallop of the posterior leaflet. There was severe left atrial enlargement. There was severe tricuspid regurgitation with bileaflet prolapse. Catheterization revealed mild nonobstructive coronary artery disease with no change in comparison with catheterization performed in 2012. There were large V waves on wedge tracing consistent with severe mitral regurgitation. Pulmonary artery pressures were mildly elevated.  Patient underwent a redo sternotomy, MV and TV repair, MAZE, and LA clip on 01/30/18. His post op course was uneventful.  He did develop some residual urinary retention and required I/O catheterization.  He was restarted on Flomax and Proscar for this. He developed junctional bradycardia and was not started on a beta blocker. He was paced for the first several days post op. He was then put on VVI backup on 01/27.  His underlying rhythm was junctional with heart rate in the low 50's. After transfer from the ICU, his heart rate was increased into the 60-70's. He was asymptomatic, even with previous bradycardia. He was volume over loaded and diuresed. He had ABL  anemia. He did not require a post op transfusion. Last H and  H was up to 8.9 and 27.7. He was weaned off the insulin drip.    He was started on Coumadin for his MV Repair and MAZE procedure.   He was discharged home on 5 mg of Coumadin. He was able to be started on Lisinopril 5 mg daily.   Comes in today. Here with his wife. He has lots of concerns. Asking about going back on Eliquis. Asking about when he will feel better. He has lost more weight. He is drinking only 1/2 of a Premier protein shake - won't drink the full carton due to the vitamin K content. He is asking about lifting weights and using his punching bag again. He is walking almost 15 minutes 4 times a day. He has been out of the house almost every day since he has been home. They typically eat one meal out a day. No fever or chills. Gets a little short of breath after walking. No swelling. Incisions look good. No problems with the coumadin. INR is good today.   Past Medical History:  Diagnosis Date  . Bladder stones   . Borderline diabetes   . BPH (benign prostatic hyperplasia)   . Coronary artery disease    cardiologist-  dr Nat Math gerhart NP--- per cath 06-02-2010 non-obstructive cad pLAD 30-40%  . Diverticulosis of colon   . Dysrhythmia    afib  . Gout   . Heart murmur   . History of adenomatous polyp of colon    2002-- tubular adenoma  . History of aortic insufficiency    severe -- s/p  AVR 08-03-2010  . History of small bowel obstruction    02/ 2007 mechanical sbo s/p  surgical intervention;  partial sbo 09/ 2011 and 03-20-2011 resolved without surgical intervention  . History of urinary retention   . HTN (hypertension)   . Peripheral neuropathy   . Persistent atrial fibrillation   . S/P aortic valve replacement with prosthetic valve 08/03/2010   tissue valve  . S/P Maze operation for atrial fibrillation 01/30/2018   Complete bilateral atrial lesion set using bipolar radiofrequency and cryothermy with clipping of  LA appendage  . S/P MVR (mitral valve repair) 01/30/2018   Complex valvuloplasty including artificial Gore-tex neochord placement x4 and Carbo medics Annuloflex ring annuloplasty, size 28  . S/P patent foramen ovale closure 08/03/2010   at same time AVR  . S/P tricuspid valve repair 01/30/2018   Using an MC3 Annuloplasty ring, size 28  . Stroke ([place])   . Tricuspid regurgitation     Past Surgical History:  Procedure Laterality Date  . CARDIAC CATHETERIZATION  06-02-2010  dr Marlou Porch   non-obstructive cad- pLAD 30-40%/  normal LVSF/  severe AI  . CARDIOVASCULAR STRESS TEST  04/12/2016   Low risk nuclear perfusion study w/ no significant reversible ischemia/  normal LV function and wall motion ,  stress ef 60%/  19mm inferior and lateral scooped ST-segment depression w/ exercise (may be repolarization abnormality), exercise capacity was moderately reduced  . CATARACT EXTRACTION W/ INTRAOCULAR LENS  IMPLANT, BILATERAL  02/2010  . CLIPPING OF ATRIAL APPENDAGE N/A 01/30/2018   Procedure: CLIPPING OF LEFT ATRIAL APPENDAGE USING ATRICLIP PRO2 45MM;  Surgeon: Rexene Alberts, MD;  Location: [place];  Service: Open Heart Surgery;  Laterality: N/A;  . CYSTOSCOPY WITH LITHOLAPAXY N/A 06/05/2016   Procedure: CYSTOSCOPY WITH LITHOLAPAXY and fulgarization of bladder neck;  Surgeon: Irine Seal, MD;  Location: [facility];  Service: Urology;  Laterality: N/A;  .  EXPLORATORY LAPARTOMY /  CHOLECYSTECTOMY  02/28/2005   for Small  bowel obstruction (mechnical)  . IR ANGIO EXTRACRAN SEL COM CAROTID INNOMINATE UNI L MOD SED  10/24/2016  . IR ANGIO VERTEBRAL SEL SUBCLAVIAN INNOMINATE BILAT MOD SED  10/24/2016  . IR PERCUTANEOUS ART THROMBECTOMY/INFUSION INTRACRANIAL INC DIAG ANGIO  10/24/2016  . IR RADIOLOGIST EVAL & MGMT  12/05/2016  . LEFT KNEE ARTHROSCOPY  2006  . MAZE N/A 01/30/2018   Procedure: MAZE;  Surgeon: Rexene Alberts, MD;  Location: [facility];  Service: Open Heart Surgery;  Laterality: N/A;    . MITRAL VALVE REPAIR N/A 01/30/2018   Procedure: MITRAL VALVE REPAIR (MVR) USING CARBOMEDICS ANNULOFLEX SIZE 28;  Surgeon: Rexene Alberts, MD;  Location: [place];  Service: Open Heart Surgery;  Laterality: N/A;  . RADIOLOGY WITH ANESTHESIA N/A 10/24/2016   Procedure: RADIOLOGY WITH ANESTHESIA;  Surgeon: Luanne Bras, MD;  Location: [place];  Service: Radiology;  Laterality: N/A;  . RIGHT FOOT SURGERY    . RIGHT MINIATURE ANTERIOR THORACOTOMY FOR AORTIC VALVE REPLACEMENT AND CLOSURE PATENT FORAMEN OVALE  08-03-2010  DR Levada Schilling Magna-ease pericardial tissue valve (45mm)  . RIGHT/LEFT HEART CATH AND CORONARY ANGIOGRAPHY N/A 10/14/2017   Procedure: RIGHT/LEFT HEART CATH AND CORONARY ANGIOGRAPHY;  Surgeon: [name], MD;  Location: [facility];  Service: Cardiovascular;  Laterality: N/A;  . TEE WITHOUT CARDIOVERSION N/A 10/14/2017   Procedure: TRANSESOPHAGEAL ECHOCARDIOGRAM (TEE);  Surgeon: Josue Hector, MD;  Location: [facility] ENDOSCOPY;  Service: Cardiovascular;  Laterality: N/A;  . TRANSTHORACIC ECHOCARDIOGRAM  05/30/2016  dr skains   moderate  LVH ef 60-65%/  bioprothesis aortic valve present ,normal grandient and no AI /  mild MV calcification , moderate MR /  mild PR/ moderate TR/  PASP 90mmHg/ (RA denisty was identified 04-27-2016 echo) and is seen again today, this is likely a promient eustacian ridge, atrium is normal size  . TRICUSPID VALVE REPLACEMENT N/A 01/30/2018   Procedure: TRICUSPID VALVE REPAIR USING MC3 SIZE 28;  Surgeon: Rexene Alberts, MD;  Location: [facility];  Service: Open Heart Surgery;  Laterality: N/A;     Medications: Current Meds  Medication Sig  . allopurinol (ZYLOPRIM) 300 MG tablet Take 150 mg by mouth daily.  Marland Kitchen amoxicillin (AMOXIL) 500 MG capsule Take 2,000 mg by mouth See admin instructions. Take 2000 mg by mouth 1 hour prior to dental appointment  . aspirin EC 81 MG EC tablet Take 1 tablet (81 mg total) by mouth daily.  Marland Kitchen b complex vitamins  tablet Take 1 tablet by mouth daily.  . diphenhydrAMINE (BENADRYL) 2 % cream Apply 1 application topically daily as needed for itching.  . finasteride (PROSCAR) 5 MG tablet Take 1 tablet (5 mg total) by mouth daily.  Marland Kitchen lisinopril (PRINIVIL,ZESTRIL) 5 MG tablet Take 1 tablet (5 mg total) by mouth daily.  . tamsulosin (FLOMAX) 0.4 MG CAPS capsule Take 2 capsules (0.8 mg total) by mouth daily after breakfast.  . traMADol (ULTRAM) 50 MG tablet Take 1 tablet (50 mg total) by mouth every 4 (four) hours as needed for moderate pain.  Marland Kitchen triamcinolone cream (KENALOG) 0.1 % Apply 1 application topically daily as needed.  . warfarin (COUMADIN) 5 MG tablet Take 1 tablet (5 mg total) by mouth daily at 6 PM. Or as directed     Allergies: Allergies  Allergen Reactions  . Sulfa Antibiotics Other (See Comments)    Granulocytosis    Social History: The patient  reports  that he has never smoked. He has never used smokeless tobacco. He reports current alcohol use. He reports that he does not use drugs.   Family History: The patient's family history includes Brain cancer in his father; Heart disease in his mother.   Review of Systems: Please see the history of present illness.   Otherwise, the review of systems is positive for none.   All other systems are reviewed and negative.   Physical Exam: VS:  BP 126/82 (BP Location: Left Arm, Patient Position: Sitting, Cuff Size: Normal)   Pulse 92   Ht 6' (1.829 m)   Wt 157 lb 12.8 oz (71.6 kg)   BMI 21.40 kg/m  .  BMI Body mass index is 21.4 kg/m.  Wt Readings from Last 3 Encounters:  02/25/18 157 lb 12.8 oz (71.6 kg)  02/05/18 161 lb 6.4 oz (73.2 kg)  01/20/18 161 lb (73 kg)    General: Pleasant. He is thinner. He is alert and in no acute distress. He weighed 167# at my last visit from August of 2019.   HEENT: Normal. Face seems gaunt.  Neck: Supple, no JVD, carotid bruits, or masses noted.  Cardiac: Regular rate and rhythm. No murmurs, rubs, or  gallops. No edema. His sternum looks good - healing nicely.  Respiratory:  Lungs are clear to auscultation bilaterally with normal work of breathing.  GI: Soft and nontender.  MS: No deformity or atrophy. Gait and ROM intact.  Skin: Warm and dry. Color is normal.  Neuro:  Strength and sensation are intact and no gross focal deficits noted.  Psych: Alert, appropriate and with normal affect.   LABORATORY DATA:  EKG:  EKG is ordered today. This looks to show sinus rhythm.  Lab Results  Component Value Date   WBC 7.5 02/05/2018   HGB 8.9 (L) 02/05/2018   HCT 27.7 (L) 02/05/2018   PLT 133 (L) 02/05/2018   GLUCOSE 121 (H) 02/04/2018   CHOL 87 10/25/2016   TRIG 54 10/25/2016   HDL 26 (L) 10/25/2016   LDLCALC 50 10/25/2016   ALT 23 01/20/2018   AST 25 01/20/2018   NA 140 02/04/2018   K 4.0 02/04/2018   CL 105 02/04/2018   CREATININE 0.71 02/04/2018   BUN 15 02/04/2018   CO2 26 02/04/2018   TSH 1.920 05/02/2017   INR 2.7 02/25/2018   HGBA1C 5.9 (H) 01/20/2018   Lab Results  Component Value Date   INR 2.7 02/25/2018   INR 2.5 02/17/2018   INR 1.7 (A) 02/10/2018     BNP (last 3 results) No results for input(s): BNP in the last 8760 hours.  ProBNP (last 3 results) No results for input(s): PROBNP in the last 8760 hours.   Other Studies Reviewed Today:  Procedure (s):   Mitral Valve Repair Redo median sternotomy Percutaneous arterial and venous cannulation Complexmitralvalvuloplasty including artificial Gore-tex neochord placement x4 Sorin Carbomedics Annuloflex ringannuloplasty (size34mm,catalog# [id number], serial #[id number])   TricuspidValve Repair Edwards mc3 ringannuloplasty (size77mm,model# [id number], serial [id number])   Maze Procedure complete bilateral atrial lesion set using bipolar radiofrequency and cryothermy ablation clipping of left atrial appendage (Atricure  Pro245 left atrial clip,size 63mm) Removal of large, 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 Roxy Manns on 01/30/2018   RIGHT/LEFT HEART CATH AND CORONARY ANGIOGRAPHY 10/2017  Conclusion     Prox Cx to Mid Cx lesion is 30% stenosed.  Ost LAD to Prox LAD lesion is 30% stenosed.  Prox LAD to Mid LAD lesion is 40%  stenosed.   1.  Nonobstructive coronary artery disease with no significant change compared to 2012 cardiac catheterization study 2.  Large V waves in the pulmonary capillary wedge tracing consistent with severe mitral insufficiency  Recommend: referral to cardiac surgery for further evaluation of treatment options (surgical mitral valve repair + Maze versus percutaneous mitral valve repair)  Resume apixaban tomorrow morning     Assessment/Plan:  1. Post op MV repair, TV repair, MAZE and clipping of LA appendage - he is doing well but I am concerned that he is actually "overdoing" in attempt to get back to his baseline. I have cautioned him about this. Will recheck lab today. Cut walking back to 3 times a day. Needs to focus on increasing caloric intake. No weight lifting or punching bag. Will get echo updated.   2. Prior AVR - see above.   3. Prior stroke - he is anticoagulated.   4. Persistent AF - looks to be in sinus today. Has had MAZE.   5. Chronic anticoagulation - on Coumadin - he says this is for the next 3 months. Unclear if he would be able to return to Eliquis - given his valvular heart disease - I would think we would need to stay on coumadin.   6. HTN - BP is fine. He has had prior ACE cough - denies this at this time.. Will follow for now.   7. Post op anemia - recheck lab today  8. Post op bradycardia - not on beta blocker - HR is ok today. Will recheck EKG on return.   Current medicines are reviewed with the patient today.  The patient does not have concerns regarding medicines other than what has been noted above.  The  following changes have been made:  See above.  Labs/ tests ordered today include:    Orders Placed This Encounter  Procedures  . Basic metabolic panel  . CBC  . EKG 12-Lead  . ECHOCARDIOGRAM COMPLETE     Disposition:   FU with me in about 4 to 6 weeks. Repeat EKG on return.   Patient is agreeable to this plan and will call if any problems develop in the interim.   SignedTruitt Merle, NP  02/25/2018 1:55 PM  [facility] [address] [place], [place]  [address] Phone: [phone] Fax: [phone]       Addendum: Post op echo noted and reviewed with Dr. Burt Knack 03/05/18 Mitral stenosis not unexpected given recent MV repair - probably need to try and add back beta blocker to promote LV filling by slowing his HR down.   Burtis Junes, RN, [facility] [address] [place], [place]  [address] [phone]

## 2018-02-26 LAB — BASIC METABOLIC PANEL
BUN/Creatinine Ratio: 19 (ref 10–24)
BUN: 18 mg/dL (ref 8–27)
CO2: 24 mmol/L (ref 20–29)
Calcium: 9 mg/dL (ref 8.6–10.2)
Chloride: 105 mmol/L (ref 96–106)
Creatinine, Ser: 0.93 mg/dL (ref 0.76–1.27)
GFR calc Af Amer: 89 mL/min/{1.73_m2} (ref >59)
GFR calc non Af Amer: 77 mL/min/{1.73_m2} (ref >59)
Glucose: 104 mg/dL — ABNORMAL HIGH (ref 65–99)
Potassium: 5 mmol/L (ref 3.5–5.2)
Sodium: 142 mmol/L (ref 134–144)

## 2018-02-26 LAB — CBC
Hematocrit: 34 % — ABNORMAL LOW (ref 37.5–51.0)
Hemoglobin: 10.6 g/dL — ABNORMAL LOW (ref 13.0–17.7)
MCH: 31.1 pg (ref 26.6–33.0)
MCHC: 31.2 g/dL — ABNORMAL LOW (ref 31.5–35.7)
MCV: 100 fL — ABNORMAL HIGH (ref 79–97)
Platelets: 194 10*3/uL (ref 150–450)
RBC: 3.41 x10E6/uL — ABNORMAL LOW (ref 4.14–5.80)
RDW: 13.7 % (ref 11.6–15.4)
WBC: 4.8 10*3/uL (ref 3.4–10.8)

## 2018-02-27 ENCOUNTER — Other Ambulatory Visit: Payer: Self-pay | Admitting: *Deleted

## 2018-02-27 DIAGNOSIS — Z8679 Personal history of other diseases of the circulatory system: Secondary | ICD-10-CM

## 2018-02-27 DIAGNOSIS — Z9889 Other specified postprocedural states: Secondary | ICD-10-CM

## 2018-02-27 DIAGNOSIS — Z952 Presence of prosthetic heart valve: Secondary | ICD-10-CM

## 2018-03-03 ENCOUNTER — Ambulatory Visit (INDEPENDENT_AMBULATORY_CARE_PROVIDER_SITE_OTHER): Payer: Self-pay | Admitting: Physician Assistant

## 2018-03-03 ENCOUNTER — Other Ambulatory Visit: Payer: Self-pay

## 2018-03-03 ENCOUNTER — Other Ambulatory Visit: Payer: Self-pay | Admitting: Physician Assistant

## 2018-03-03 ENCOUNTER — Ambulatory Visit
Admission: RE | Admit: 2018-03-03 | Discharge: 2018-03-03 | Disposition: A | Discharge status: Home / Self Care | Payer: Medicare Other | Source: Ambulatory Visit | Attending: Thoracic Surgery (Cardiothoracic Vascular Surgery) | Admitting: Thoracic Surgery (Cardiothoracic Vascular Surgery)

## 2018-03-03 VITALS — BP 132/85 | HR 95 | Resp 16 | Ht 72.0 in | Wt 160.6 lb

## 2018-03-03 DIAGNOSIS — Z8679 Personal history of other diseases of the circulatory system: Secondary | ICD-10-CM

## 2018-03-03 DIAGNOSIS — Z9889 Other specified postprocedural states: Secondary | ICD-10-CM

## 2018-03-03 DIAGNOSIS — Z952 Presence of prosthetic heart valve: Secondary | ICD-10-CM

## 2018-03-03 NOTE — Progress Notes (Signed)
HPI: [name] is an 83 year old male with history of aortic stenosis, status post aortic valve replacement in 2012 with a 25 mm magna ease valve and who also has history of treated with catheter-based intervention with subsequent excellent recovery.  He he recently developed a symptomatic mitral regurgitation and was referred for consideration of repair.  After evaluation in the office, Dr. Alvan Ayers recommended redo sternotomy, mitral valve repair, and maze procedure to treat his chronic, persistent atrial fibrillation.  The decision was made to proceed with surgery.  He was admitted to the hospital on 01/30/2018 where redo sternotomy was accomplished followed by mitral valve annuloplasty utilizing a 28 mm Sorin CarboMedics annular flex annuloplasty ring and neo-cord placement x4.  In addition, a complete biatrial maze procedure was performed along with left atrial appendage clipping and tricuspid valve annuloplasty.  His postoperative course was significant for postop urinary retention and was treated with Proscar and Flomax.  He eventually had return of appropriate voiding function.  He was bradycardic early postoperatively so was not started on a beta-blocker.  He was discharged on Coumadin.  Since his discharge home, he is continued to make a progressive recovery.  He has been quite aggressive with his exercise regimen walking 3-4 times daily.  He has had no known palpitations.  He has mild shortness of breath with activity but this is improving his INR is being followed by the Coumadin clinic with the last INR of 2.7 on 02/25/2018.  Review of the chart shows that he was in sinus rhythm at the time of his cardiology follow-up visit 1 week ago.    Current Outpatient Medications  Medication Sig Dispense Refill  . allopurinol (ZYLOPRIM) 300 MG tablet Take 150 mg by mouth daily.    Marland Kitchen amoxicillin (AMOXIL) 500 MG capsule Take 2,000 mg by mouth See admin instructions. Take 2000 mg by mouth 1 hour prior to dental  appointment    . aspirin EC 81 MG EC tablet Take 1 tablet (81 mg total) by mouth daily.    Marland Kitchen b complex vitamins tablet Take 1 tablet by mouth daily.    . diphenhydrAMINE (BENADRYL) 2 % cream Apply 1 application topically daily as needed for itching.    . finasteride (PROSCAR) 5 MG tablet Take 1 tablet (5 mg total) by mouth daily. 30 tablet 0  . lisinopril (PRINIVIL,ZESTRIL) 5 MG tablet Take 1 tablet (5 mg total) by mouth daily. 30 tablet 1  . tamsulosin (FLOMAX) 0.4 MG CAPS capsule Take 2 capsules (0.8 mg total) by mouth daily after breakfast. 30 capsule 0  . traMADol (ULTRAM) 50 MG tablet Take 1 tablet (50 mg total) by mouth every 4 (four) hours as needed for moderate pain. 30 tablet 0  . traZODone (DESYREL) 50 MG tablet TAKE 1 TABLET BY MOUTH ONCE DAILY AT BEDTIME AS NEEDED    . triamcinolone cream (KENALOG) 0.1 % Apply 1 application topically daily as needed.    . warfarin (COUMADIN) 5 MG tablet Take 1 tablet (5 mg total) by mouth daily at 6 PM. Or as directed 30 tablet 1   No current facility-administered medications for this visit.     Physical Exam: Heart-regular rate and rhythm.  He has a soft systolic murmur.  This is not unexpected given his old prosthetic aortic valve.  Rhythm strip obtained here in the office today shows normal sinus rhythm. Chest-breath sounds are clear to auscultation.  Sternotomy incision is well approximated.  Sternum is stable.  Chest tube insertion sites are  also healing with no sign of complication. Exts-there is no peripheral edema.  Diagnostic Tests: EXAM: CHEST - 2 VIEW  COMPARISON:  02/04/2018  FINDINGS: Sternotomy wires unchanged. Lungs are adequately inflated and otherwise clear. Postsurgical change compatible patient's recent aortic and tricuspid valve replacements. Cardiomediastinal silhouette and remainder of the exam is unchanged.  IMPRESSION: No active cardiopulmonary disease.  Electronically Signed   By: [name] M.D.   On:  03/03/2018 13:47   Impression / Plan A very pleasant 83yo gentleman presenting for scheduled follow up 1 month after re-do sternotomy, mitral valve repair consisting of annuloplasty with a  42mm Sorin Carbomedics annuloplasty ring, neo-cord placement x 4, tricuspid valve annuloplasty, compledt bi-atrial Maze procedure and clipping of the  left atrial appendage. He is making a progressive and satisfactory recovery.  It appears he is maintaining sinus rhythm as he was in a sinus rhythm at his office visit 1 week ago and her rhythm strips also demonstrate stable sinus rhythm.  He is making progress with his activity and endurance.. Medications are reviewed.  He has stopped taking the finasteride.  He remains on Coumadin but the dose has been increased to 7.5 mg p.o. on Mondays and Fridays and 5 mg p.o. daily on all other days.  His INR is therapeutic at 2.7.  All other medications remain the same.  Activity recommendations and sternal precautions were reviewed.  He may resume driving at this stage.  He is asked to follow-up with Dr. Ihor Ayers in 3 months and contact us anytime should any other problems arise related to surgery.    Gerald Odea, PA-C [facility] [phone]

## 2018-03-03 NOTE — Patient Instructions (Signed)
May resume driving. No lifting greater than 10 lbs in each hand for another month.  No punching bags for 2 months. OK to proceed with cardiac rehab.

## 2018-03-04 ENCOUNTER — Ambulatory Visit (INDEPENDENT_AMBULATORY_CARE_PROVIDER_SITE_OTHER): Payer: Medicare Other | Admitting: *Deleted

## 2018-03-04 ENCOUNTER — Ambulatory Visit (HOSPITAL_COMMUNITY): Payer: Medicare Other | Attending: Cardiovascular Disease

## 2018-03-04 DIAGNOSIS — I63411 Cerebral infarction due to embolism of right middle cerebral artery: Secondary | ICD-10-CM

## 2018-03-04 DIAGNOSIS — I4819 Other persistent atrial fibrillation: Secondary | ICD-10-CM | POA: Diagnosis not present

## 2018-03-04 DIAGNOSIS — Z7901 Long term (current) use of anticoagulants: Secondary | ICD-10-CM

## 2018-03-04 DIAGNOSIS — I4811 Longstanding persistent atrial fibrillation: Secondary | ICD-10-CM

## 2018-03-04 DIAGNOSIS — Z9889 Other specified postprocedural states: Secondary | ICD-10-CM | POA: Diagnosis not present

## 2018-03-04 LAB — POCT INR: INR: 2.3 (ref 2.0–3.0)

## 2018-03-04 NOTE — Patient Instructions (Addendum)
Let us know when you need a refill on you Warfarin. Call [phone] Description   Continue taking 1 tablet daily except 1.5 tablets on Mondays and Fridays.  Recheck in 10 days. Coumadin Clinic [phone] call with bleeding problems or new medications.

## 2018-03-05 ENCOUNTER — Encounter: Payer: Self-pay | Admitting: *Deleted

## 2018-03-05 NOTE — Research (Unsigned)
[facility] Research study 30 day follow up completed. No adverse events or issues.

## 2018-03-06 NOTE — Research (Signed)
Visit 4 labs collected

## 2018-03-07 NOTE — Telephone Encounter (Signed)
Called patient to see if he was interested in participating in the Cardiac Rehab Program. Patient stated not at this time, no reason given.  Closed referral

## 2018-03-10 ENCOUNTER — Ambulatory Visit: Payer: Medicare Other

## 2018-03-14 ENCOUNTER — Ambulatory Visit (INDEPENDENT_AMBULATORY_CARE_PROVIDER_SITE_OTHER): Payer: Medicare Other | Admitting: *Deleted

## 2018-03-14 DIAGNOSIS — Z9889 Other specified postprocedural states: Secondary | ICD-10-CM

## 2018-03-14 DIAGNOSIS — I4811 Longstanding persistent atrial fibrillation: Secondary | ICD-10-CM

## 2018-03-14 DIAGNOSIS — Z7901 Long term (current) use of anticoagulants: Secondary | ICD-10-CM | POA: Diagnosis not present

## 2018-03-14 DIAGNOSIS — I63411 Cerebral infarction due to embolism of right middle cerebral artery: Secondary | ICD-10-CM

## 2018-03-14 LAB — POCT INR: INR: 4 — AB (ref 2.0–3.0)

## 2018-03-14 NOTE — Patient Instructions (Signed)
Description   Do not take any Coumadin today then continue taking 1 tablet daily except 1.5 tablets on Mondays and Fridays.  Recheck in 10 days. Coumadin Clinic [phone] call with bleeding problems or new medications.

## 2018-03-15 ENCOUNTER — Other Ambulatory Visit (HOSPITAL_COMMUNITY): Payer: Self-pay | Admitting: Physician Assistant

## 2018-03-24 ENCOUNTER — Ambulatory Visit (INDEPENDENT_AMBULATORY_CARE_PROVIDER_SITE_OTHER): Payer: Medicare Other

## 2018-03-24 ENCOUNTER — Ambulatory Visit: Payer: Medicare Other | Admitting: Nurse Practitioner

## 2018-03-24 ENCOUNTER — Encounter: Payer: Self-pay | Admitting: *Deleted

## 2018-03-24 ENCOUNTER — Encounter: Payer: Self-pay | Admitting: Nurse Practitioner

## 2018-03-24 ENCOUNTER — Other Ambulatory Visit: Payer: Self-pay

## 2018-03-24 VITALS — BP 116/78 | HR 81 | Ht 72.0 in | Wt 160.4 lb

## 2018-03-24 DIAGNOSIS — I63411 Cerebral infarction due to embolism of right middle cerebral artery: Secondary | ICD-10-CM

## 2018-03-24 DIAGNOSIS — I4811 Longstanding persistent atrial fibrillation: Secondary | ICD-10-CM

## 2018-03-24 DIAGNOSIS — Z7901 Long term (current) use of anticoagulants: Secondary | ICD-10-CM | POA: Diagnosis not present

## 2018-03-24 DIAGNOSIS — Z006 Encounter for examination for normal comparison and control in clinical research program: Secondary | ICD-10-CM

## 2018-03-24 DIAGNOSIS — Z9889 Other specified postprocedural states: Secondary | ICD-10-CM

## 2018-03-24 LAB — POCT INR: INR: 2.7 (ref 2.0–3.0)

## 2018-03-24 LAB — CBC
Hematocrit: 35.6 % — ABNORMAL LOW (ref 37.5–51.0)
Hemoglobin: 11.6 g/dL — ABNORMAL LOW (ref 13.0–17.7)
MCH: 30.8 pg (ref 26.6–33.0)
MCHC: 32.6 g/dL (ref 31.5–35.7)
MCV: 94 fL (ref 79–97)
Platelets: 196 10*3/uL (ref 150–450)
RBC: 3.77 x10E6/uL — ABNORMAL LOW (ref 4.14–5.80)
RDW: 13.9 % (ref 11.6–15.4)
WBC: 6.7 10*3/uL (ref 3.4–10.8)

## 2018-03-24 LAB — BASIC METABOLIC PANEL
BUN/Creatinine Ratio: 26 — ABNORMAL HIGH (ref 10–24)
BUN: 23 mg/dL (ref 8–27)
CO2: 21 mmol/L (ref 20–29)
Calcium: 9.6 mg/dL (ref 8.6–10.2)
Chloride: 106 mmol/L (ref 96–106)
Creatinine, Ser: 0.89 mg/dL (ref 0.76–1.27)
GFR calc Af Amer: 93 mL/min/{1.73_m2} (ref >59)
GFR calc non Af Amer: 81 mL/min/{1.73_m2} (ref >59)
Glucose: 96 mg/dL (ref 65–99)
Potassium: 4.6 mmol/L (ref 3.5–5.2)
Sodium: 144 mmol/L (ref 134–144)

## 2018-03-24 MED ORDER — ATENOLOL 25 MG PO TABS: 12.5000 mg | ORAL_TABLET | Freq: Two times a day (BID) | ORAL | 3 refills | Status: DC

## 2018-03-24 NOTE — Progress Notes (Signed)
CARDIOLOGY OFFICE NOTE  Date:  03/24/2018    Gerald Ape Ruffner Jr. Date of Birth: 1935/04/17 Medical Record #[id number]  PCP:  Leighton Ruff, MD  Cardiologist:  Servando Snare     Chief Complaint  Patient presents with   Follow-up    1 month check    History of Present Illness: [name]. is a 83 y.o. male who presents today for a follow up visit.  Seen for Dr. Marlou Porch. Primarily follows with me. Has seen Dr. Burt Knack for structural heart.   [name] has a history of a prior aortic valve replacement using abioprosthetictissue valve in 2012,hypertension, atrial fibrillation on long-term anticoagulation, and previous embolic stroke in 0263.    Patient's cardiac history dates back to 2012 when he underwent aortic valve replacement using a3mm Edwards Magna Easestented bovine pericardial tissue valve via right minithoracotomy approach for severe aortic insufficiency. He recovered uneventfully and was followed for a period of time by Dr. Marlou Porch but subsequently lost to follow-up. He has been followed since 2018 by Truitt Merle, and previous echocardiograms document the presence of normal functioning bioprosthetic tissue valve in aortic position with mild mitral regurgitation and normal left ventricular systolic function. In October 2018 he had an acute right middle cerebral artery stroke associated with dense left-sided hemiplegia. He was promptly treated with catheter directed therapy and he recovered remarkably well. Transthoracic echocardiogram performed at that time revealed normal left ventricular function with normal functioning bioprosthetic tissue valve in aortic position and what was felt to be moderate mitral regurgitation. The patient was notably in atrial fibrillation. Transesophageal echocardiogram was not performed but the patient was started on Eliquis for long-term anticoagulation. He recovered remarkably well from his stroke and has eventually  returned to essentially baseline physical status.He did report decreased exercise tolerance with poor endurance, and he admitted to some exertional shortness of breath with more strenuous activity. However, he continued to walk every day and he exercised religiously, including doing push-ups and lifting some weights. Recent follow-up echocardiogram revealed preserved left ventricular function but worsening mitral regurgitation. He was referred to Dr. Burt Knack for consultation and underwent transesophageal echocardiogram and diagnostic cardiac catheterization on October 14, 2017. TEE revealed normal left ventricular systolic function with normal functioning bioprosthetic tissue valve in aortic position. There was severe mitral regurgitation with mitral valve prolapse including a flail segment involving the middle scallop of the posterior leaflet. There was severe left atrial enlargement. There was severe tricuspid regurgitation with bileaflet prolapse. Catheterization revealed mild nonobstructive coronary artery disease with no change in comparison with catheterization performed in 2012. There were large V waves on wedge tracing consistent with severe mitral regurgitation. Pulmonary artery pressures were mildly elevated.  Patient underwent a redo sternotomy, MV and TV repair, MAZE, and LA clip on 01/30/18. His post op course was uneventful. He did develop some residual urinary retention and required I/O catheterization. He was restarted on Flomax and Proscarfor this. He developed junctional bradycardia and was not started on a beta blocker. He was paced for the first several days post op. He was then put on VVI backup on 01/27.His underlying rhythmwas junctional with heart rate in the low 50's. After transfer from the ICU, his heart rate was increased into the 60-70's. He was asymptomatic, even with previous bradycardia.He was volume over loaded and diuresed. He had ABL anemia. He did not require a  post op transfusion. Last H and H wasup to 8.9 and 27.7. He was weaned off the insulin  drip. He was started on Coumadin for his MV Repair and MAZE procedure. He was discharged home on 5mg  of Coumadin.He was able to be started on Lisinopril 5 mg daily.   I then saw him last month - lots of concerns. Was not feeling as well as he wished. Was losing more weight. Was doing a lot of walking. He was doing well overall from our standpoint.   Patient and wife screened for recent travel, fever, URI symptoms and shortness of breath. Patient and wife deny travel over the last 14 days and are currently without symptoms.    Comes in today. Here with his wife. He has lots of questions - asking about going out to eat - playing bridge, going to rehab. He is feeling better. Some positional dizziness. No syncope. Weight is up a few pounds. No chest pain. BP is lower at home.   Past Medical History:  Diagnosis Date   Bladder stones    Borderline diabetes    BPH (benign prostatic hyperplasia)    Coronary artery disease    cardiologist-  dr Nat Math gerhart NP--- per cath 06-02-2010 non-obstructive cad pLAD 30-40%   Diverticulosis of colon    Dysrhythmia    afib   Gout    Heart murmur    History of adenomatous polyp of colon    2002-- tubular adenoma   History of aortic insufficiency    severe -- s/p  AVR 08-03-2010   History of small bowel obstruction    02/ 2007 mechanical sbo s/p  surgical intervention;  partial sbo 09/ 2011 and 03-20-2011 resolved without surgical intervention   History of urinary retention    HTN (hypertension)    Peripheral neuropathy    Persistent atrial fibrillation    S/P aortic valve replacement with prosthetic valve 08/03/2010   tissue valve   S/P Maze operation for atrial fibrillation 01/30/2018   Complete bilateral atrial lesion set using bipolar radiofrequency and cryothermy with clipping of LA appendage   S/P MVR (mitral valve repair)  01/30/2018   Complex valvuloplasty including artificial Gore-tex neochord placement x4 and Carbo medics Annuloflex ring annuloplasty, size 28   S/P patent foramen ovale closure 08/03/2010   at same time AVR   S/P tricuspid valve repair 01/30/2018   Using an MC3 Annuloplasty ring, size 28   Stroke [facility])    Tricuspid regurgitation     Past Surgical History:  Procedure Laterality Date   CARDIAC CATHETERIZATION  06-02-2010  dr Marlou Porch   non-obstructive cad- pLAD 30-40%/  normal LVSF/  severe AI   CARDIOVASCULAR STRESS TEST  04/12/2016   Low risk nuclear perfusion study w/ no significant reversible ischemia/  normal LV function and wall motion ,  stress ef 60%/  19mm inferior and lateral scooped ST-segment depression w/ exercise (may be repolarization abnormality), exercise capacity was moderately reduced   CATARACT EXTRACTION W/ INTRAOCULAR LENS  IMPLANT, BILATERAL  02/2010   CLIPPING OF ATRIAL APPENDAGE N/A 01/30/2018   Procedure: CLIPPING OF LEFT ATRIAL APPENDAGE USING ATRICLIP PRO2 45MM;  Surgeon: Rexene Alberts, MD;  Location: [place];  Service: Open Heart Surgery;  Laterality: N/A;   CYSTOSCOPY WITH LITHOLAPAXY N/A 06/05/2016   Procedure: CYSTOSCOPY WITH LITHOLAPAXY and fulgarization of bladder neck;  Surgeon: Irine Seal, MD;  Location: [facility];  Service: Urology;  Laterality: N/A;   EXPLORATORY LAPARTOMY /  CHOLECYSTECTOMY  02/28/2005   for Small  bowel obstruction (mechnical)   IR ANGIO EXTRACRAN SEL COM CAROTID  INNOMINATE UNI L MOD SED  10/24/2016   IR ANGIO VERTEBRAL SEL SUBCLAVIAN INNOMINATE BILAT MOD SED  10/24/2016   IR PERCUTANEOUS ART THROMBECTOMY/INFUSION INTRACRANIAL INC DIAG ANGIO  10/24/2016   IR RADIOLOGIST EVAL & MGMT  12/05/2016   LEFT KNEE ARTHROSCOPY  2006   MAZE N/A 01/30/2018   Procedure: MAZE;  Surgeon: Rexene Alberts, MD;  Location: [place];  Service: Open Heart Surgery;  Laterality: N/A;   MITRAL VALVE REPAIR N/A 01/30/2018    Procedure: MITRAL VALVE REPAIR (MVR) USING CARBOMEDICS ANNULOFLEX SIZE 28;  Surgeon: Rexene Alberts, MD;  Location: [place];  Service: Open Heart Surgery;  Laterality: N/A;   RADIOLOGY WITH ANESTHESIA N/A 10/24/2016   Procedure: RADIOLOGY WITH ANESTHESIA;  Surgeon: Luanne Bras, MD;  Location: [place];  Service: Radiology;  Laterality: N/A;   RIGHT FOOT SURGERY     RIGHT MINIATURE ANTERIOR THORACOTOMY FOR AORTIC VALVE REPLACEMENT AND CLOSURE PATENT FORAMEN OVALE  08-03-2010  DR Levada Schilling Magna-ease pericardial tissue valve (85mm)   RIGHT/LEFT HEART CATH AND CORONARY ANGIOGRAPHY N/A 10/14/2017   Procedure: RIGHT/LEFT HEART CATH AND CORONARY ANGIOGRAPHY;  Surgeon: [name], MD;  Location: [place] CV LAB;  Service: Cardiovascular;  Laterality: N/A;   TEE WITHOUT CARDIOVERSION N/A 10/14/2017   Procedure: TRANSESOPHAGEAL ECHOCARDIOGRAM (TEE);  Surgeon: Josue Hector, MD;  Location[facility] ENDOSCOPY;  Service: Cardiovascular;  Laterality: N/A;   TRANSTHORACIC ECHOCARDIOGRAM  05/30/2016  dr skains   moderate  LVH ef 60-65%/  bioprothesis aortic valve present ,normal grandient and no AI /  mild MV calcification , moderate MR /  mild PR/ moderate TR/  PASP 67mmHg/ (RA denisty was identified 04-27-2016 echo) and is seen again today, this is likely a promient eustacian ridge, atrium is normal size   TRICUSPID VALVE REPLACEMENT N/A 01/30/2018   Procedure: TRICUSPID VALVE REPAIR USING MC3 SIZE 28;  Surgeon: Rexene Alberts, MD;  Location: [place];  Service: Open Heart Surgery;  Laterality: N/A;     Medications: Current Meds  Medication Sig   allopurinol (ZYLOPRIM) 300 MG tablet Take 150 mg by mouth daily.   amoxicillin (AMOXIL) 500 MG capsule Take 2,000 mg by mouth See admin instructions. Take 2000 mg by mouth 1 hour prior to dental appointment   aspirin EC 81 MG EC tablet Take 1 tablet (81 mg total) by mouth daily.   b complex vitamins tablet Take 1 tablet by mouth daily.    diphenhydrAMINE (BENADRYL) 2 % cream Apply 1 application topically daily as needed for itching.   finasteride (PROSCAR) 5 MG tablet Take 1 tablet (5 mg total) by mouth daily.   tamsulosin (FLOMAX) 0.4 MG CAPS capsule Take 2 capsules (0.8 mg total) by mouth daily after breakfast.   traZODone (DESYREL) 50 MG tablet TAKE 1 TABLET BY MOUTH ONCE DAILY AT BEDTIME AS NEEDED   triamcinolone cream (KENALOG) 0.1 % Apply 1 application topically daily as needed.   warfarin (COUMADIN) 5 MG tablet Take 1 tablet (5 mg total) by mouth daily at 6 PM. Or as directed   [DISCONTINUED] lisinopril (PRINIVIL,ZESTRIL) 5 MG tablet Take 1 tablet (5 mg total) by mouth daily.     Allergies: Allergies  Allergen Reactions   Sulfa Antibiotics Other (See Comments)    Granulocytosis    Social History: The patient  reports that he has never smoked. He has never used smokeless tobacco. He reports current alcohol use. He reports that he does not use drugs.   Family History: The  patient's family history includes Brain cancer in his father; Heart disease in his mother.   Review of Systems: Please see the history of present illness.   Otherwise, the review of systems is positive for none.   All other systems are reviewed and negative.   Physical Exam: VS:  BP 116/78 (BP Location: Left Arm, Patient Position: Sitting, Cuff Size: Normal)    Pulse 81    Ht 6' (1.829 m)    Wt 160 lb 6.4 oz (72.8 kg)    SpO2 97% Comment: at rest   BMI 21.75 kg/m  .  BMI Body mass index is 21.75 kg/m.  Wt Readings from Last 3 Encounters:  03/24/18 160 lb 6.4 oz (72.8 kg)  03/03/18 160 lb 9.6 oz (72.8 kg)  02/25/18 157 lb 12.8 oz (71.6 kg)    General: Elderly. Alert and in no acute distress.   HEENT: Normal.  Neck: Supple, no JVD, carotid bruits, or masses noted.  Cardiac: Regular rate and rhythm. No significant murmur. No edema.  Respiratory:  Lungs are clear to auscultation bilaterally with normal work of breathing.  GI: Soft  and nontender.  MS: No deformity or atrophy. Gait and ROM intact.  Skin: Warm and dry. Color is normal.  Neuro:  Strength and sensation are intact and no gross focal deficits noted.  Psych: Alert, appropriate and with normal affect.   LABORATORY DATA:  EKG:  EKG is ordered today. This demonstrates NSR with PAC.  Lab Results  Component Value Date   WBC 4.8 02/25/2018   HGB 10.6 (L) 02/25/2018   HCT 34.0 (L) 02/25/2018   PLT 194 02/25/2018   GLUCOSE 104 (H) 02/25/2018   CHOL 87 10/25/2016   TRIG 54 10/25/2016   HDL 26 (L) 10/25/2016   LDLCALC 50 10/25/2016   ALT 23 01/20/2018   AST 25 01/20/2018   NA 142 02/25/2018   K 5.0 02/25/2018   CL 105 02/25/2018   CREATININE 0.93 02/25/2018   BUN 18 02/25/2018   CO2 24 02/25/2018   TSH 1.920 05/02/2017   INR 2.7 03/24/2018   HGBA1C 5.9 (H) 01/20/2018   Lab Results  Component Value Date   INR 2.7 03/24/2018   INR 4.0 (A) 03/14/2018   INR 2.3 03/04/2018     BNP (last 3 results) No results for input(s): BNP in the last 8760 hours.  ProBNP (last 3 results) No results for input(s): PROBNP in the last 8760 hours.   Other Studies Reviewed Today:  EchoIMPRESSIONS 02/2018    1. The left ventricle has normal systolic function with an ejection fraction of 60-65%. The cavity size was normal. Left ventricular diastolic Doppler parameters are indeterminate.  2. Left atrial size was severely dilated.  3. Right atrial size was mildly dilated.  4. Mitral valve repair. Moderate mitral valve stenosis.  5. The tricuspid valve is tricuspid valve annuloplasty ring present.  6. Aortic valve regurgitation is trivial by color flow Doppler.  7. The pulmonic valve was normal in structure. Pulmonic valve regurgitation is mild by color flow Doppler.  8. There is mild dilatation of the ascending aorta measuring 37 mm.   Procedure (s):  Mitral Valve Repair Redo median sternotomy Percutaneous arterial and venous  cannulation Complexmitralvalvuloplasty including artificial Gore-tex neochord placement x4 Sorin Carbomedics Annuloflex ringannuloplasty (size45mm,catalog# [id number], serial #[id number])   TricuspidValve Repair Edwards mc3 ringannuloplasty (size67mm,model# [id number], serial [id number])   Maze Procedure complete bilateral atrial lesion set using bipolar radiofrequency and cryothermy ablation clipping of left atrial appendage (  Atricure [id number] left atrial clip,size 45mm) Removal of large, anomalous muscle band traversing the right atriumby Dr. Roxy Manns on 01/30/2018   RIGHT/LEFT HEART CATH AND CORONARY ANGIOGRAPHY 10/2017  Conclusion     Prox Cx to Mid Cx lesion is 30% stenosed.  Ost LAD to Prox LAD lesion is 30% stenosed.  Prox LAD to Mid LAD lesion is 40% stenosed.  1. Nonobstructive coronary artery disease with no significant change compared to 2012 cardiac catheterization study 2. Large V waves in the pulmonary capillary wedge tracing consistent with severe mitral insufficiency  Recommend: referral to cardiac surgery for further evaluation of treatment options (surgical mitral valve repair + Maze versus percutaneous mitral valve repair)  Resume apixaban tomorrow morning   Assessment/Plan:  1. Post op MV repair, TV repair, MAZE and clipping of LA appendage - he is doing well. Does have some mitral stenosis on echo - discussed prior with Dr. Burt Knack - will try to get him back on low dose beta blocker. BP is pretty soft - stopping ACE today. Recheck his lab. He will monitor his BP and HR in the interim.   2. Prior AVR - see above.   3. Prior stroke - he is anticoagulated. No problems noted.   4. Persistent AF - looks to still be in sinus today. Has had MAZE.   5. Chronic anticoagulation - on Coumadin - Mr Ayers told me at last visit that this was for the next 3 months. Unclear if he  would be able to return to Eliquis - given his valvular heart disease - I would think we would need to stay on coumadin.   6. HTN - BP little soft - has had prior ACE cough - this has not returned but trying to add back beta blocker to promote diastolic filling.   7. Post op anemia - recheck lab today  8. Post op bradycardia - he will monitor his HR and BP for me at home.   9. Discussed that they both need to be "social distancing" - not eating out, not playing bridge, etc. Needs to limit exposure to prevent current [place] outbreak.   Current medicines are reviewed with the patient today.  The patient does not have concerns regarding medicines other than what has been noted above.  The following changes have been made:  See above.  Labs/ tests ordered today include:    Orders Placed This Encounter  Procedures   Basic metabolic panel   CBC   EKG 12-Lead   EKG 12-Lead     Disposition:   FU with me in about 2 months. Recheck EKG on return..   Patient is agreeable to this plan and will call if any problems develop in the interim.   SignedTruitt Merle, NP  03/24/2018 11:03 AM  [facility] [address] [place]  [address] Phone: [phone] Fax: [phone]

## 2018-03-24 NOTE — Patient Instructions (Signed)
Description   Continue on same dosage 1 tablet daily except 1.5 tablets on Mondays and Fridays.  Recheck in 3 weeks. Coumadin Clinic [phone] call with bleeding problems or new medications.

## 2018-03-24 NOTE — Patient Instructions (Addendum)
We will be checking the following labs today - BMET & CBC   Medication Instructions:    Continue with your current medicines. BUT  STOP Lisinopril  Start back on Atenolol 25 mg - taking just 1/2 tablet daily - start tomorrow. I have sent this to drug store.    If you need a refill on your cardiac medications before your next appointment, please call your pharmacy.     Testing/Procedures To Be Arranged:  N/A  Follow-Up:   See me in about 2 months.     At [facility], you and your health needs are our priority.  As part of our continuing mission to provide you with exceptional heart care, we have created designated Provider Care Teams.  These Care Teams include your primary Cardiologist (physician) and Advanced Practice Providers (APPs -  Physician Assistants and Nurse Practitioners) who all work together to provide you with the care you need, when you need it.  Special Instructions:  . None  Call the [place] office at [phone] if you have any questions, problems or concerns.

## 2018-03-26 NOTE — Research (Signed)
ASTELLAS Research study: Re consented patient for ASTELLAS Version 3.0. Subject met inclusion and exclusion criteria.  The informed consent form, study requirements and expectations were reviewed with the subject and questions and concerns were addressed prior to the signing of the consent form.  The subject verbalized understanding of the trail requirements.  The subject agreed to participate in the ASTELLAS trial and signed the informed consent.  The informed consent was obtained prior to performance of any protocol-specific procedures for the subject.  A copy of the signed informed consent was given to the subject and a copy was placed in the subject's medical record.

## 2018-03-31 ENCOUNTER — Telehealth: Payer: Self-pay | Admitting: *Deleted

## 2018-03-31 NOTE — Telephone Encounter (Signed)
Pt calling in today due to pt's bp being up this weekend.  Pt was to start back on lisinopril one table by mouth (5mg ) daily and stay on Atenolol (12.5 mg) daily.  Pt was not taking the Atenolol.  Will start back today and keep monitoring bp readings.  Pt will send a mychart message later this week.

## 2018-04-10 ENCOUNTER — Telehealth: Payer: Self-pay

## 2018-04-10 NOTE — Telephone Encounter (Signed)

## 2018-04-14 ENCOUNTER — Other Ambulatory Visit: Payer: Self-pay

## 2018-04-14 ENCOUNTER — Ambulatory Visit (INDEPENDENT_AMBULATORY_CARE_PROVIDER_SITE_OTHER): Payer: Medicare Other

## 2018-04-14 DIAGNOSIS — I4811 Longstanding persistent atrial fibrillation: Secondary | ICD-10-CM

## 2018-04-14 DIAGNOSIS — Z9889 Other specified postprocedural states: Secondary | ICD-10-CM

## 2018-04-14 DIAGNOSIS — Z7901 Long term (current) use of anticoagulants: Secondary | ICD-10-CM | POA: Diagnosis not present

## 2018-04-14 DIAGNOSIS — I63411 Cerebral infarction due to embolism of right middle cerebral artery: Secondary | ICD-10-CM | POA: Diagnosis not present

## 2018-04-14 LAB — POCT INR: INR: 2.4 (ref 2.0–3.0)

## 2018-04-14 NOTE — Patient Instructions (Signed)
Description   Called spoke with pt, advised to continue on same dosage 1 tablet daily except 1.5 tablets on Mondays and Fridays.  Recheck in 4 weeks. Coumadin Clinic [phone] call with bleeding problems or new medications.

## 2018-04-29 ENCOUNTER — Other Ambulatory Visit: Payer: Self-pay | Admitting: Family Medicine

## 2018-04-29 DIAGNOSIS — R9389 Abnormal findings on diagnostic imaging of other specified body structures: Secondary | ICD-10-CM

## 2018-05-07 DIAGNOSIS — Z006 Encounter for examination for normal comparison and control in clinical research program: Secondary | ICD-10-CM

## 2018-05-07 NOTE — Research (Signed)
Astellas 90 Day Follow Up: 06-May-2018  Patient doing well at this time, no concerns or questions at this time. Medications are unchanged at this time. I thanked the patient for his participation in the [employer] as this will complete his visits.   Current Outpatient Medications:  .  allopurinol (ZYLOPRIM) 300 MG tablet, Take 150 mg by mouth daily., Disp: , Rfl:  .  amoxicillin (AMOXIL) 500 MG capsule, Take 2,000 mg by mouth See admin instructions. Take 2000 mg by mouth 1 hour prior to dental appointment, Disp: , Rfl:  .  aspirin EC 81 MG EC tablet, Take 1 tablet (81 mg total) by mouth daily., Disp: , Rfl:  .  atenolol (TENORMIN) 25 MG tablet, Take 0.5 tablets (12.5 mg total) by mouth 2 (two) times daily., Disp: 45 tablet, Rfl: 3 .  b complex vitamins tablet, Take 1 tablet by mouth daily., Disp: , Rfl:  .  diphenhydrAMINE (BENADRYL) 2 % cream, Apply 1 application topically daily as needed for itching., Disp: , Rfl:  .  finasteride (PROSCAR) 5 MG tablet, Take 1 tablet (5 mg total) by mouth daily., Disp: 30 tablet, Rfl: 0 .  tamsulosin (FLOMAX) 0.4 MG CAPS capsule, Take 2 capsules (0.8 mg total) by mouth daily after breakfast., Disp: 30 capsule, Rfl: 0 .  traZODone (DESYREL) 50 MG tablet, TAKE 1 TABLET BY MOUTH ONCE DAILY AT BEDTIME AS NEEDED, Disp: , Rfl:  .  triamcinolone cream (KENALOG) 0.1 %, Apply 1 application topically daily as needed., Disp: , Rfl:  .  warfarin (COUMADIN) 5 MG tablet, Take 1 tablet (5 mg total) by mouth daily at 6 PM. Or as directed, Disp: 30 tablet, Rfl: 1   [id number]  MOBILITY:    I HAVE NO PROBLEMS WALKING [x]   I HAVE SLIGHT PROBLEMS WALKING []   I HAVE MODERATE PROBLEMS WALKING []   I HAVE SEVERE PROBLEMS WALKING []   I AM UNABLE TO WALK  []     SELF-CARE:   I HAVE NO PROBLEMS WASING OR DRESSING MYSELF  [x]   I HAVE SLIGHT PROBLEMS WASHING OR DRESSING MYSELF  []   I HAVE MODERATE PROBLEMS WASHING OR DRESSING MYSELF []   I HAVE SEVERE PROBLEMS WASHING  OR DRESSING MYSELF  []   I HAVE SEVERE PROBLEMS WASHING OR DRESSING MYSELF  []   I AM UNABLE TO [place] OR DRESS MYSELF []     USUAL ACTIVITIES: (E.G. WORK/STUDY/HOUSEWORK/FAMILY OR LEISURE ACTIVITIES.    I HAVE NO PROBLEMS DOING MY USUAL ACTIVITIES [x]   I HAVE SLIGHT PROBLEMS DOING MY USUAL ACTIVITIES []   I HAVE MODERATE PROBLEMS DOING MY USUAL ACTIVIITIES []   I HAVE SEVERE PROBLEMS DOING MY USUAL ACTIVITIES []   I AM UNABLE TO DO MY USUAL ACTIVITIES []     PAIN /DISCOMFORT   I HAVE NO PAIN OR DISCOMFORT [x]   I HAVE SLIGHT PAIN OR DISCOMFORT []   I HAVE MODERATE PAIN OR DISCOMFORT []   I HAVE SEVERE PAIN OR DISCOMFORT []   I HAVE EXTREME PAIN OR DISCOMFORT []     ANXIETY/DEPRESSION   I AM NOT ANXIOUS OR DEPRESSED [x]   I AM SLIGHTLY ANXIOUS OR DEPRESSED []   I AM MODERATELY ANXIOUS OR DREPRESSED []   I AM SEVERELY ANXIOUS OR DEPRESSED []   I AM EXTREMELY ANXIOUS OR DEPRESSED []     SCALE OF 0-100 HOW WOULD YOU RATE TODAY?  0 IS THE WORSE AND 100 IS THE BEST HEALTH YOU CAN IMAGINE: 75

## 2018-05-12 ENCOUNTER — Telehealth: Payer: Self-pay | Admitting: Nurse Practitioner

## 2018-05-12 ENCOUNTER — Telehealth: Payer: Self-pay

## 2018-05-12 NOTE — Telephone Encounter (Signed)
Virtual Visit Pre-Appointment Phone Call  "(Name), I am calling you today to discuss your upcoming appointment. We are currently trying to limit exposure to the virus that causes COVID-19 by seeing patients at home rather than in the office."  1. "What is the BEST phone number to call the day of the visit?" - include this in appointment notes  2. "Do you have or have access to (through a family member/friend) a smartphone with video capability that we can use for your visit?" a. If yes - list this number in appt notes as "cell" (if different from BEST phone #) and list the appointment type as a VIDEO visit in appointment notes b. If no - list the appointment type as a PHONE visit in appointment notes  3. Confirm consent - "In the setting of the current Covid19 crisis, you are scheduled for a (phone or video) visit with your provider on (Tuesday, May 5) at(10:15 am ).  Just as we do with many in-office visits, in order for you to participate in this visit, we must obtain consent.  If you'd like, I can send this to your mychart (if signed up) or email for you to review.  Otherwise, I can obtain your verbal consent now.  All virtual visits are billed to your insurance company just like a normal visit would be.  By agreeing to a virtual visit, we'd like you to understand that the technology does not allow for your provider to perform an examination, and thus may limit your provider's ability to fully assess your condition. If your provider identifies any concerns that need to be evaluated in person, we will make arrangements to do so.  Finally, though the technology is pretty good, we cannot assure that it will always work on either your or our end, and in the setting of a video visit, we may have to convert it to a phone-only visit.  In either situation, we cannot ensure that we have a secure connection.  Are you willing to proceed?" STAFF: Did the patient verbally acknowledge consent to telehealth  visit? Document YES/NO here: YES  4. Advise patient to be prepared - "Two hours prior to your appointment, go ahead and check your blood pressure, pulse, oxygen saturation, and your weight (if you have the equipment to check those) and write them all down. When your visit starts, your provider will ask you for this information. If you have an Apple Watch or Kardia device, please plan to have heart rate information ready on the day of your appointment. Please have a pen and paper handy nearby the day of the visit as well."  5. Give patient instructions for MyChart download to smartphone OR Doximity/Doxy.me as below if video visit (depending on what platform provider is using)  6. Inform patient they will receive a phone call 15 minutes prior to their appointment time (may be from unknown caller ID) so they should be prepared to answer    TELEPHONE CALL NOTE  [name] has been deemed a candidate for a follow-up tele-health visit to limit community exposure during the Covid-19 pandemic. I spoke with the patient via phone to ensure availability of phone/video source, confirm preferred email & phone number, and discuss instructions and expectations.  I reminded [name] to be prepared with any vital sign and/or heart rhythm information that could potentially be obtained via home monitoring, at the time of his visit. I reminded [name] to  expect a phone call prior to his visit.  Kailash Hinze Avanell Shackleton 05/12/2018 8:50 AM   INSTRUCTIONS FOR DOWNLOADING THE MYCHART APP TO SMARTPHONE  - The patient must first make sure to have activated MyChart and know their login information - If Apple, go to [employer] and type in MyChart in the search bar and download the app. If Android, ask patient to go to [employer] and type in [place] in the search bar and download the app. The app is free but as with any other app downloads, their phone may require them to verify  saved payment information or Apple/Android password.  - The patient will need to then log into the app with their MyChart username and password, and select  as their healthcare provider to link the account. When it is time for your visit, go to the MyChart app, find appointments, and click Begin Video Visit. Be sure to Select Allow for your device to access the Microphone and Camera for your visit. You will then be connected, and your provider will be with you shortly.  **If they have any issues connecting, or need assistance please contact MyChart service desk [phone])**  **If using a computer, in order to ensure the best quality for their visit they will need to use either of the following Internet Browsers: [employer], or Google Chrome**  IF USING DOXIMITY or DOXY.ME - The patient will receive a link just prior to their visit by text.     FULL LENGTH CONSENT FOR TELE-HEALTH VISIT   I hereby voluntarily request, consent and authorize [facility] and its employed or contracted physicians, physician assistants, nurse practitioners or other licensed health care professionals (the Practitioner), to provide me with telemedicine health care services (the "Services") as deemed necessary by the treating Practitioner. I acknowledge and consent to receive the Services by the Practitioner via telemedicine. I understand that the telemedicine visit will involve communicating with the Practitioner through live audiovisual communication technology and the disclosure of certain medical information by electronic transmission. I acknowledge that I have been given the opportunity to request an in-person assessment or other available alternative prior to the telemedicine visit and am voluntarily participating in the telemedicine visit.  I understand that I have the right to withhold or withdraw my consent to the use of telemedicine in the course of my care at any time, without  affecting my right to future care or treatment, and that the Practitioner or I may terminate the telemedicine visit at any time. I understand that I have the right to inspect all information obtained and/or recorded in the course of the telemedicine visit and may receive copies of available information for a reasonable fee.  I understand that some of the potential risks of receiving the Services via telemedicine include:  Marland Kitchen Delay or interruption in medical evaluation due to technological equipment failure or disruption; . Information transmitted may not be sufficient (e.g. poor resolution of images) to allow for appropriate medical decision making by the Practitioner; and/or  . In rare instances, security protocols could fail, causing a breach of personal health information.  Furthermore, I acknowledge that it is my responsibility to provide information about my medical history, conditions and care that is complete and accurate to the best of my ability. I acknowledge that Practitioner's advice, recommendations, and/or decision may be based on factors not within their control, such as incomplete or inaccurate data provided by me or distortions of diagnostic images or specimens that  may result from electronic transmissions. I understand that the practice of medicine is not an exact science and that Practitioner makes no warranties or guarantees regarding treatment outcomes. I acknowledge that I will receive a copy of this consent concurrently upon execution via email to the email address I last provided but may also request a printed copy by calling the office of [facility].    I understand that my insurance will be billed for this visit.   I have read or had this consent read to me. . I understand the contents of this consent, which adequately explains the benefits and risks of the Services being provided via telemedicine.  . I have been provided ample opportunity to ask questions regarding this  consent and the Services and have had my questions answered to my satisfaction. . I give my informed consent for the services to be provided through the use of telemedicine in my medical care  By participating in this telemedicine visit I agree to the above.

## 2018-05-12 NOTE — Progress Notes (Addendum)
Telehealth Visit     Virtual Visit via Video Note   This visit type was conducted due to national recommendations for restrictions regarding the COVID-19 Pandemic (e.g. social distancing) in an effort to limit this patient's exposure and mitigate transmission in our community.  Due to his co-morbid illnesses, this patient is at least at moderate risk for complications without adequate follow up.  This format is felt to be most appropriate for this patient at this time.  All issues noted in this document were discussed and addressed.  A limited physical exam was performed with this format.  Please refer to the patient's chart for his consent to telehealth for [facility].   Evaluation Performed:  Follow-up visit  This visit type was conducted due to national recommendations for restrictions regarding the COVID-19 Pandemic (e.g. social distancing).  This format is felt to be most appropriate for this patient at this time.  All issues noted in this document were discussed and addressed.  No physical exam was performed (except for noted visual exam findings with Video Visits).  Please refer to the patient's chart (MyChart message for video visits and phone note for telephone visits) for the patient's consent to telehealth for [facility].  Date:  05/13/2018   ID:  Gerald Amas., DOB 04-07-1935, MRN [id number]  Patient Location:  Home  Provider location:   Home  PCP:  Leighton Ruff, MD  Cardiologist:  Servando Snare & No primary care provider on file.  Electrophysiologist:  None   Chief Complaint:  Follow up visit.   History of Present Illness:    Gerald Dalesandro. is a 83 y.o. male who presents via audio/video conferencing for a telehealth visit today.  Seen for Dr. Marlou Porch. Primarily follows with me. Has seen Dr. Burt Knack for structural heart.   Gerald. Somera has a history of a prioraortic valve replacement using abioprosthetictissue valve in 2012,hypertension, atrial  fibrillation on long-term anticoagulation, and previous embolic stroke in 3299.  Patient's cardiac history dates back to 2012 when he underwent aortic valve replacement using a58mm Edwards Magna Easestented bovine pericardial tissue valve via right minithoracotomy approach for severe aortic insufficiency. He recovered uneventfully and was followed for a period of time by Dr. Marlou Porch but subsequently lost to follow-up. He has been followed since 2018 by [facility], and previous echocardiograms document the presence of normal functioning bioprosthetic tissue valve in aortic position with mild mitral regurgitation and normal left ventricular systolic function. In October 2018 he hadan acute right middle cerebral artery stroke associated with dense left-sided hemiplegia. He was promptly treated with catheter directed therapy and he recovered remarkably well. Transthoracic echocardiogram performed at that time revealed normal left ventricular function with normal functioning bioprosthetic tissue valve in aortic position and what was felt to be moderate mitral regurgitation. The patient was notably in atrial fibrillation. Transesophageal echocardiogram was not performed but the patient was started on Eliquis for long-term anticoagulation. He recovered remarkably well from his stroke and has eventually returned to essentially baseline physical status.He did report decreased exercise tolerance with poor endurance, and he admitted to some exertional shortness of breath with more strenuous activity. However, hecontinuedto walk every day and he exercisedreligiously, including doing push-ups and lifting some weights. Recent follow-up echocardiogram revealed preserved left ventricular function but worsening mitral regurgitation. He was referred to Dr. Burt Knack for consultation and underwent transesophageal echocardiogram and diagnostic cardiac catheterization on October 14, 2017. TEE revealed normal left  ventricular systolic function with normal functioning bioprosthetic tissue  valve in aortic position. There was severe mitral regurgitation with mitral valve prolapse including a flail segment involving the middle scallop of the posterior leaflet. There was severe left atrial enlargement. There was severe tricuspid regurgitation with bileaflet prolapse. Catheterization revealed mild nonobstructive coronary artery disease with no change in comparison with catheterization performed in 2012. There were large V waves on wedge tracing consistent with severe mitral regurgitation. Pulmonary artery pressures were mildly elevated.  Patient underwent a redo sternotomy, MV and TV repair, MAZE, and LA clip on 01/30/18. His post op course was uneventful.He did develop some residual urinary retention and required I/O catheterization. He was restarted on Flomax and Proscarfor this. He developed junctional bradycardia and was not started on a beta blocker. He was paced for the first several days post op. He was then put on VVI backup on 01/27.His underlying rhythmwas junctional with heart rate in the low 50's. After transfer from the ICU, his heart rate was increased into the 60-70's. Hewasasymptomatic, even with previous bradycardia.He was volume over loaded and diuresed. He had ABL anemia. He did not require a post op transfusion.  He was weaned off the insulin drip. He was started on Coumadin for his MV Repair and MAZE procedure. He wasdischarged home on 5mg  of Coumadin.He was able to be started onLisinopril 5 mg daily.  I then saw him in February for his post op visit - lots of concerns. Was not feeling as well as he wished or thought he should be. Was losing more weight. Was doing a lot of walking and probably was overdoing it.   Last seen in mid March - was doing ok. Had not really started social distancing and they were not staying at home. They typically eat out most meals. Cardiac status ok  - some positional dizziness but no syncope. He had been able to gain a few pounds.   The patient does not have symptoms concerning for COVID-19 infection (fever, chills, cough, or new shortness of breath).   Seen today via Doximity video. He has consented for this visit. Lots of questions and concerns. He still has no stamina - or not the level he felt he should have this far out from his surgery. He will have to stop periodically while walking. This has improved. He is not really able to tell me any specific activity that he has not been able to resume. He remains lightheaded with position changes - he was told to sit for about 10 seconds before getting up - this was reiterated today.  He is to see the dentist at the end of the month. He is worried about the mitral stenosis - asking if Dr. Burt Knack can fix this.  Taking B12 instead of a multiple vitamin - asking which is better and if these are the same.  BP high this AM after walking - 157/101 and now is 134/86.  His HR is in the 60's. He has not really been taking his BP at all while at home. He is asking why he is on lisinopril now and why he is on coumadin now.  He had his coumadin checked this morning thru the drive thru at [facility] and his dose is being adjusted.  He is walking twice a day - for about an hour total. They have been the staying home. Her surgery is still on hold. [name] is also having a repeat MRI on his spine - there was concern at L3 of a possible lesion - he  has had no known malignancy (actually had extensive CT scans for weight loss in the past) - this is for later this month.    Past Medical History:  Diagnosis Date   Bladder stones    Borderline diabetes    BPH (benign prostatic hyperplasia)    Coronary artery disease    cardiologist-  dr Nat Math gerhart NP--- per cath 06-02-2010 non-obstructive cad pLAD 30-40%   Diverticulosis of colon    Dysrhythmia    afib   Gout    Heart murmur    History of  adenomatous polyp of colon    2002-- tubular adenoma   History of aortic insufficiency    severe -- s/p  AVR 08-03-2010   History of small bowel obstruction    02/ 2007 mechanical sbo s/p  surgical intervention;  partial sbo 09/ 2011 and 03-20-2011 resolved without surgical intervention   History of urinary retention    HTN (hypertension)    Peripheral neuropathy    Persistent atrial fibrillation    S/P aortic valve replacement with prosthetic valve 08/03/2010   tissue valve   S/P Maze operation for atrial fibrillation 01/30/2018   Complete bilateral atrial lesion set using bipolar radiofrequency and cryothermy with clipping of LA appendage   S/P MVR (mitral valve repair) 01/30/2018   Complex valvuloplasty including artificial Gore-tex neochord placement x4 and Car[employer] medics Annuloflex ring annuloplasty, size 28   S/P patent foramen ovale closure 08/03/2010   at same time AVR   S/P tricuspid valve repair 01/30/2018   Using an MC3 Annuloplasty ring, size 28   Stroke [facility])    Tricuspid regurgitation    Past Surgical History:  Procedure Laterality Date   CARDIAC CATHETERIZATION  06-02-2010  dr Marlou Porch   non-obstructive cad- pLAD 30-40%/  normal LVSF/  severe AI   CARDIOVASCULAR STRESS TEST  04/12/2016   Low risk nuclear perfusion study w/ no significant reversible ischemia/  normal LV function and wall motion ,  stress ef 60%/  61mm inferior and lateral scooped ST-segment depression w/ exercise (may be repolarization abnormality), exercise capacity was moderately reduced   CATARACT EXTRACTION W/ INTRAOCULAR LENS  IMPLANT, BILATERAL  02/2010   CLIPPING OF ATRIAL APPENDAGE N/A 01/30/2018   Procedure: CLIPPING OF LEFT ATRIAL APPENDAGE USING ATRICLIP PRO2 45MM;  Surgeon: Rexene Alberts, MD;  Location: [place];  Service: Open Heart Surgery;  Laterality: N/A;   CYSTOSCOPY WITH LITHOLAPAXY N/A 06/05/2016   Procedure: CYSTOSCOPY WITH LITHOLAPAXY and fulgarization of bladder neck;   Surgeon: Irine Seal, MD;  Location: [facility];  Service: Urology;  Laterality: N/A;   EXPLORATORY LAPARTOMY /  CHOLECYSTECTOMY  02/28/2005   for Small  bowel obstruction (mechnical)   IR ANGIO EXTRACRAN SEL COM CAROTID INNOMINATE UNI L MOD SED  10/24/2016   IR ANGIO VERTEBRAL SEL SUBCLAVIAN INNOMINATE BILAT MOD SED  10/24/2016   IR PERCUTANEOUS ART THROMBECTOMY/INFUSION INTRACRANIAL INC DIAG ANGIO  10/24/2016   IR RADIOLOGIST EVAL & MGMT  12/05/2016   LEFT KNEE ARTHROSCOPY  2006   MAZE N/A 01/30/2018   Procedure: MAZE;  Surgeon: Rexene Alberts, MD;  Location: [place];  Service: Open Heart Surgery;  Laterality: N/A;   MITRAL VALVE REPAIR N/A 01/30/2018   Procedure: MITRAL VALVE REPAIR (MVR) USING CARBOMEDICS ANNULOFLEX SIZE 28;  Surgeon: Rexene Alberts, MD;  Location: [place];  Service: Open Heart Surgery;  Laterality: N/A;   RADIOLOGY WITH ANESTHESIA N/A 10/24/2016   Procedure: RADIOLOGY WITH ANESTHESIA;  Surgeon:  Luanne Bras, MD;  Location: [place];  Service: Radiology;  Laterality: N/A;   RIGHT FOOT SURGERY     RIGHT MINIATURE ANTERIOR THORACOTOMY FOR AORTIC VALVE REPLACEMENT AND CLOSURE PATENT FORAMEN OVALE  08-03-2010  DR Levada Schilling Magna-ease pericardial tissue valve (7mm)   RIGHT/LEFT HEART CATH AND CORONARY ANGIOGRAPHY N/A 10/14/2017   Procedure: RIGHT/LEFT HEART CATH AND CORONARY ANGIOGRAPHY;  Surgeon: [name], MD;  Location: [place] [facility];  Service: Cardiovascular;  Laterality: N/A;   TEE WITHOUT CARDIOVERSION N/A 10/14/2017   Procedure: TRANSESOPHAGEAL ECHOCARDIOGRAM (TEE);  Surgeon: Josue Hector, MD;  Location[facility] END[facility]COPY;  Service: Cardiovascular;  Laterality: N/A;   TRANSTHORACIC ECHOCARDIOGRAM  05/30/2016  dr skains   moderate  LVH ef 60-65%/  bioprothesis aortic valve present ,normal grandient and no AI /  mild MV calcification , moderate Gerald /  mild PR/ moderate TR/  PASP 82mmHg/ (RA denisty was identified 04-27-2016 echo)  and is seen again today, this is likely a promient eustacian ridge, atrium is normal size   TRICUSPID VALVE REPLACEMENT N/A 01/30/2018   Procedure: TRICUSPID VALVE REPAIR USING MC3 SIZE 28;  Surgeon: Rexene Alberts, MD;  Location: [place];  Service: Open Heart Surgery;  Laterality: N/A;     Current Meds  Medication Sig   allopurinol (ZYLOPRIM) 300 MG tablet Take 150 mg by mouth daily.   amoxicillin (AMOXIL) 500 MG capsule Take 2,000 mg by mouth See admin instructions. Take 2000 mg by mouth 1 hour prior to dental appointment   aspirin EC 81 MG EC tablet Take 1 tablet (81 mg total) by mouth daily.   atenolol (TENORMIN) 25 MG tablet Take 0.5 tablets (12.5 mg total) by mouth 2 (two) times daily.   b complex vitamins tablet Take 1 tablet by mouth daily.   diphenhydrAMINE (BENADRYL) 2 % cream Apply 1 application topically daily as needed for itching.   finasteride (PROSCAR) 5 MG tablet Take 1 tablet (5 mg total) by mouth daily.   lisinopril (ZESTRIL) 5 MG tablet Take 5 mg by mouth daily.   tamsulosin (FLOMAX) 0.4 MG CAPS capsule Take 2 capsules (0.8 mg total) by mouth daily after breakfast.   traZODone (DESYREL) 50 MG tablet TAKE 1 TABLET BY MOUTH ONCE DAILY AT BEDTIME AS NEEDED   triamcinolone cream (KENALOG) 0.1 % Apply 1 application topically daily as needed.   warfarin (COUMADIN) 5 MG tablet Take 1 tablet (5 mg total) by mouth daily at 6 PM. Or as directed     Allergies:   Sulfa antibiotics   Social History   Tobacco Use   Smoking status: Never Smoker   Smokeless tobacco: Never Used  Substance Use Topics   Alcohol use: Yes    Comment: ONE OR TWO PER MONTH   Drug use: No     Family Hx: The patient's family history includes Brain cancer in his father; Heart disease in his mother.  ROS:   Please see the history of present illness.   All other systems reviewed are negative.    Objective:    Vital Signs:  BP 134/86 Comment: 157 101 after walk   Pulse 68    Ht 6'  (1.829 m)    Wt 160 lb (72.6 kg)    BMI 21.70 kg/m    Wt Readings from Last 3 Encounters:  05/13/18 160 lb (72.6 kg)  03/24/18 160 lb 6.4 oz (72.8 kg)  03/03/18 160 lb 9.6 oz (72.8 kg)    Alert male in  no acute distress. Not short of breath with conversation. He looks good. His weight is stable.    Labs/Other Tests and Data Reviewed:    Lab Results  Component Value Date   WBC 6.7 03/24/2018   HGB 11.6 (L) 03/24/2018   HCT 35.6 (L) 03/24/2018   PLT 196 03/24/2018   GLUCOSE 96 03/24/2018   CHOL 87 10/25/2016   TRIG 54 10/25/2016   HDL 26 (L) 10/25/2016   LDLCALC 50 10/25/2016   ALT 23 01/20/2018   AST 25 01/20/2018   NA 144 03/24/2018   K 4.6 03/24/2018   CL 106 03/24/2018   CREATININE 0.89 03/24/2018   BUN 23 03/24/2018   CO2 21 03/24/2018   TSH 1.920 05/02/2017   INR 3.2 (A) 05/13/2018   HGBA1C 5.9 (H) 01/20/2018       BNP (last 3 results) No results for input(s): BNP in the last 8760 hours.  ProBNP (last 3 results) No results for input(s): PROBNP in the last 8760 hours.    Prior CV studies:    The following studies were reviewed today:  EchoIMPRESSIONS 02/2018   1. The left ventricle has normal systolic function with an ejection fraction of 60-65%. The cavity size was normal. Left ventricular diastolic Doppler parameters are indeterminate. 2. Left atrial size was severely dilated. 3. Right atrial size was mildly dilated. 4. Mitral valve repair. Moderate mitral valve stenosis. 5. The tricuspid valve is tricuspid valve annuloplasty ring present. 6. Aortic valve regurgitation is trivial by color flow Doppler. 7. The pulmonic valve was normal in structure. Pulmonic valve regurgitation is mild by color flow Doppler. 8. There is mild dilatation of the ascending aorta measuring 37 mm.   Procedure (s) 01/30/2018:  Mitral Valve Repair Redo median sternotomy Percutaneous arterial and venous  cannulation Complexmitralvalvuloplasty including artificial Gore-tex neochord placement x4 Sorin Carbomedics Annuloflex ringannuloplasty (size18mm,catalog# [id number], serial #[id number])   TricuspidValve Repair Edwards mc3 ringannuloplasty (size16mm,model# [id number], serial [id number])   Maze Procedure complete bilateral atrial lesion set using bipolar radiofrequency and cryothermy ablation clipping of left atrial appendage (Atricure Pro245 left atrial clip,size 43mm) Removal of large, anomalous muscle band traversing the right atriumby Dr. Roxy Manns on 01/30/2018   RIGHT/LEFT HEART CATH AND CORONARY ANGIOGRAPHY10/2019  Conclusion     Prox Cx to Mid Cx lesion is 30% stenosed.  Ost LAD to Prox LAD lesion is 30% stenosed.  Prox LAD to Mid LAD lesion is 40% stenosed.  1. Nonobstructive coronary artery disease with no significant change compared to 2012 cardiac catheterization study 2. Large V waves in the pulmonary capillary wedge tracing consistent with severe mitral insufficiency  Recommend: referral to cardiac surgery for further evaluation of treatment options (surgical mitral valve repair + Maze versus percutaneous mitral valve repair)  Resume apixaban tomorrow morning     ASSESSMENT & PLAN:    1. Post op MV repair, TV repair, MAZE and clipping of LA appendage -he did have some mitral stenosis on echo - discussed previously with Dr. Burt Knack - we added back low dose beta blocker to help increase his filling time - HR is ok today. He will need follow up echos going forward. Ok to go to the dentist - needs to continue with SBE.   2. Prior AVR- see above.  3. Prior stroke- he is anticoagulated.No problems noted.   4. Persistent AF (probably related to valvular heart disease) - s/p MAZE - he was in NSR at last visit - will need EKG on return. He denies any palpitations.  5. Chronic  anticoagulation - on Coumadin -[name] told me at last visit that this was for the next 3 months. Unclear if he would be able to return to Eliquis - given his valvular heart disease - I would think we would need to stay on coumadin.I have asked him to discuss further with Dr. Roxy Manns - I will ask for Dr. Antionette Char input as well. No problems noted at this time.   6. HTN -BP ok - was higher after walking - he has not been checking and he will start monitoring.   7. Positional lightheadedness - encouraged to get up slower. He will also monitor his BP for me.   8. Post op bradycardia - does not seem to have recurred - he will need EKG on return visit.   9. ?L3 lesion - for repeat MRI later this month.   10. COVID-19 Education: The signs and symptoms of COVID-19 were discussed with the patient and how to seek care for testing (follow up with PCP or arrange E-visit).  The importance of social distancing, staying at home, hand hygiene and wearing a mask when out in public were discussed today.  Patient Risk:   After full review of this patient's clinical status, I feel that they are at least moderate risk at this time.  Time:   Today, I have spent 14 minutes with the patient with telehealth technology discussing the above issues.     Medication Adjustments/Labs and Tests Ordered: Current medicines are reviewed at length with the patient today.  Concerns regarding medicines are outlined above.   Tests Ordered: No orders of the defined types were placed in this encounter.   Medication Changes: No orders of the defined types were placed in this encounter.   Disposition:  FU with me in about 8 weeks - will try for in office visit. He will need EKG on return.   Patient is agreeable to this plan and will call if any problems develop in the interim.   Amie Critchley, NP  05/13/2018 10:26 AM    [facility]  Addendum:   05/14/18   Discussed with Dr.  Burt Knack yesterday - we will be continuing coumadin long term given valvular disease/AF.   Note has also been reviewed by Dr. Roxy Manns - He has a 56mm ring and the anterior leaflet moves normally with no leaflet restriction. There is flow acceleration across the valve which is typical following mitral valve repair. Mean gradient was estimated 5-6 mmHg which he may notice clinically during exercise, but still correlates to calculated MVA >1.5. As active as he is the cardiac output is likely relatively high. Ventricular compliance plays a role as well. Based on the 2D views of the valve orifice it appears that the MVA would be considerably larger than 1.5 using planimetry, which makes sense given the 32mm ring. In my experience, patients who are very active and exercise strenuously often times feel like their peak exercise capacity is slow to recover after mitral valve repair, and I suspect this may be related to the sudden change from severe Gerald to mild functional mitral stenosis, making it more difficult to generate high cardiac output. 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 I will discuss with him when I see him.    We will continue with the current plan as outlined yesterday.   Burtis Junes, RN, [place] [address]  [place], [place]  [address] [phone]

## 2018-05-12 NOTE — Telephone Encounter (Signed)
New Message    Pt is calling and is wanting the nurse to call him to help him with the virtual visit    Please call

## 2018-05-12 NOTE — Telephone Encounter (Signed)

## 2018-05-13 ENCOUNTER — Telehealth (INDEPENDENT_AMBULATORY_CARE_PROVIDER_SITE_OTHER): Payer: Medicare Other | Admitting: Nurse Practitioner

## 2018-05-13 ENCOUNTER — Other Ambulatory Visit: Payer: Self-pay

## 2018-05-13 ENCOUNTER — Encounter: Payer: Self-pay | Admitting: Nurse Practitioner

## 2018-05-13 ENCOUNTER — Ambulatory Visit (INDEPENDENT_AMBULATORY_CARE_PROVIDER_SITE_OTHER): Payer: Medicare Other | Admitting: Pharmacist

## 2018-05-13 VITALS — BP 134/86 | HR 68 | Ht 72.0 in | Wt 160.0 lb

## 2018-05-13 DIAGNOSIS — Z7901 Long term (current) use of anticoagulants: Secondary | ICD-10-CM

## 2018-05-13 DIAGNOSIS — Z9889 Other specified postprocedural states: Secondary | ICD-10-CM

## 2018-05-13 DIAGNOSIS — Z952 Presence of prosthetic heart valve: Secondary | ICD-10-CM

## 2018-05-13 DIAGNOSIS — I5032 Chronic diastolic (congestive) heart failure: Secondary | ICD-10-CM

## 2018-05-13 DIAGNOSIS — I63411 Cerebral infarction due to embolism of right middle cerebral artery: Secondary | ICD-10-CM

## 2018-05-13 DIAGNOSIS — I4811 Longstanding persistent atrial fibrillation: Secondary | ICD-10-CM | POA: Diagnosis not present

## 2018-05-13 DIAGNOSIS — I4819 Other persistent atrial fibrillation: Secondary | ICD-10-CM

## 2018-05-13 DIAGNOSIS — Z7189 Other specified counseling: Secondary | ICD-10-CM

## 2018-05-13 LAB — POCT INR: INR: 3.2 — AB (ref 2.0–3.0)

## 2018-05-13 NOTE — Patient Instructions (Addendum)
After Visit Summary:  We will be checking the following labs today - NONE   Medication Instructions:    Continue with your current medicines.    If you need a refill on your cardiac medications before your next appointment, please call your pharmacy.     Testing/Procedures To Be Arranged:  Ok to have your MRI later this month  Ok to go the dentist later this month - take your antibiotics prior to going to the dentist.   Follow-Up:   See Dr. Roxy Manns this month as planned.   See me in about 8 weeks - will hope this will be in the office.     At [facility], you and your health needs are our priority.  As part of our continuing mission to provide you with exceptional heart care, we have created designated Provider Care Teams.  These Care Teams include your primary Cardiologist (physician) and Advanced Practice Providers (APPs -  Physician Assistants and Nurse Practitioners) who all work together to provide you with the care you need, when you need it.  Special Instructions:  . Stay safe, stay home, wash your hands for at least 20 seconds and wear a mask when out in public.  . It was good to talk with you both today. . Check your blood pressure and heart rate at least weekly for me . Talk to Dr. Drema Dallas about checking your B12 levels.  Marland Kitchen Keep up the walking   Call the [place] office at [phone] if you have any questions, problems or concerns.

## 2018-05-13 NOTE — Progress Notes (Signed)
Discussed with Dr. Burt Knack - Dr. Burt Knack prefers continuing on Warfarin as well.   Gerald Junes, RN, [place] [address] [place], [place]  [address] [phone]

## 2018-05-23 ENCOUNTER — Other Ambulatory Visit: Payer: Self-pay

## 2018-05-26 ENCOUNTER — Ambulatory Visit (INDEPENDENT_AMBULATORY_CARE_PROVIDER_SITE_OTHER): Payer: Medicare Other | Admitting: Thoracic Surgery (Cardiothoracic Vascular Surgery)

## 2018-05-26 ENCOUNTER — Other Ambulatory Visit: Payer: Self-pay

## 2018-05-26 ENCOUNTER — Encounter: Payer: Self-pay | Admitting: Thoracic Surgery (Cardiothoracic Vascular Surgery)

## 2018-05-26 VITALS — BP 157/84 | HR 72 | Temp 98.6°F | Resp 16 | Ht 71.0 in | Wt 166.0 lb

## 2018-05-26 DIAGNOSIS — Z9889 Other specified postprocedural states: Secondary | ICD-10-CM | POA: Diagnosis not present

## 2018-05-26 DIAGNOSIS — Z8679 Personal history of other diseases of the circulatory system: Secondary | ICD-10-CM

## 2018-05-26 NOTE — Patient Instructions (Signed)

## 2018-05-26 NOTE — Progress Notes (Signed)
[address]       [place] [address]             [phone]     CARDIOTHORACIC SURGERY OFFICE NOTE  Referring Provider is [name], MD Primary Cardiologist is No primary care provider on file. PCP is Leighton Ruff, MD   HPI:  Patient is an 83 year old malestatus post aortic valve replacement using abioprosthetictissue valve in 2012,hypertension, atrial fibrillation on long-term anticoagulation, and previous embolic stroke in [id number] who returns the office today for routine follow-up status post mitral valve repair, tricuspid valve repair, and Maze procedure on January 30, 2018 for severe mitral regurgitation, tricuspid regurgitation, and recurrent persistent atrial fibrillation.  The patient's early postoperative recovery was uncomplicated and he was last seen here in our office on March 03, 2018 at which time he was making good progress with his recovery.  Follow-up transthoracic echocardiogram performed March 04, 2018 revealed normal left ventricular function with ejection fraction estimated 60 to 65%.  There was intact mitral valve repair with no residual mitral regurgitation.  Mean transvalvular gradient across the mitral valve was estimated 5.7 mmHg.  Tricuspid valve repair was also intact with what was felt to be trivial residual mitral regurgitation.  The patient's pre-existing bioprosthetic tissue valve in the aortic position was still functioning normally.  Since then the patient has been followed closely by Truitt Merle who saw him using video telemedicine on May 13, 2018.  Routine twelve-lead EKG performed March 24, 2018 revealed sinus rhythm.  The patient remains anticoagulated using Coumadin and has had his prothrombin time followed in the Coumadin clinic.  He returns to our office today for routine follow-up and reports that overall he is doing well.  He and his wife have been very careful to participate in social distancing.  The patient  ambulates daily.  He remains a bit frustrated that his overall endurance is not as good as it was prior to surgery.  However, he specifically denies significant exertional shortness of breath, PND, or orthopnea.  He reports occasional mild dizziness if he stands up suddenly after having been supine for prolonged period time.  Appetite is good.  He exercises religiously.  He walks daily and reports that he does quite well unless he is going up a significant incline.  He no longer has any significant pain or soreness in his chest.  He denies any fevers, cough, or worsening shortness of breath.   Current Outpatient Medications  Medication Sig Dispense Refill   allopurinol (ZYLOPRIM) 300 MG tablet Take 150 mg by mouth daily.     amoxicillin (AMOXIL) 500 MG capsule Take 2,000 mg by mouth See admin instructions. Take 2000 mg by mouth 1 hour prior to dental appointment     aspirin EC 81 MG EC tablet Take 1 tablet (81 mg total) by mouth daily.     atenolol (TENORMIN) 25 MG tablet Take 0.5 tablets (12.5 mg total) by mouth 2 (two) times daily. 45 tablet 3   b complex vitamins tablet Take 1 tablet by mouth daily.     diphenhydrAMINE (BENADRYL) 2 % cream Apply 1 application topically daily as needed for itching.     finasteride (PROSCAR) 5 MG tablet Take 1 tablet (5 mg total) by mouth daily. 30 tablet 0   lisinopril (ZESTRIL) 5 MG tablet Take 5 mg by mouth daily.     tamsulosin (FLOMAX) 0.4 MG CAPS capsule Take 2 capsules (0.8 mg total) by mouth daily after  breakfast. 30 capsule 0   traZODone (DESYREL) 50 MG tablet TAKE 1 TABLET BY MOUTH ONCE DAILY AT BEDTIME AS NEEDED     triamcinolone cream (KENALOG) 0.1 % Apply 1 application topically daily as needed.     warfarin (COUMADIN) 5 MG tablet Take 1 tablet (5 mg total) by mouth daily at 6 PM. Or as directed 30 tablet 1   No current facility-administered medications for this visit.       Physical Exam:   BP (!) 157/84 (BP Location: Right Arm,  Patient Position: Sitting, Cuff Size: Normal)    Pulse 72    Temp 98.6 F (37 C) (Oral)    Resp 16    Ht 5\' 11"  (1.803 m)    Wt 166 lb (75.3 kg)    SpO2 98%    BMI 23.15 kg/m   General:  Well-appearing  Chest:   Clear to auscultation  CV:   Regular rate and rhythm without murmur  Incisions:  Well-healed, sternum is stable  Abdomen:  Soft nontender  Extremities:  Warm and well-perfused  Diagnostic Tests:  ECHOCARDIOGRAM REPORT       Patient Name:   [name] Date of Exam: 03/04/2018 Medical Rec #:  [id number]         Height:       72.0 in Accession #:    [id number]        Weight:       160.6 lb Date of Birth:  04/07/1935         BSA:          1.94 m Patient Age:    49 years          BP:           132/85 mmHg Patient Gender: M                 HR:           81 bpm. Exam Location:  [address]    Procedure: 2D Echo, Cardiac Doppler and Color Doppler  Indications:    I05.9 Mitral Valve Disorder   History:        Patient has prior history of Echocardiogram examinations, most                 recent 09/06/2017. [place] valve replacement-25 mm Magna Ease.                 Mitral valve repair 28 mm Sorin [employer]omed[employer] annuloplasty ring.                 S/p post Maze procedure. Tricuspid valve repair MC3 Annuloplasty                 ring size 28. Murmur. Paroxysmal atrial fibrillation. Coronary                 artery disease.   Sonographer:    NaTashia Rodgers-Jones RDCS Referring Phys: [place]    1. The left ventricle has normal systolic function with an ejection fraction of 60-65%. The cavity size was normal. Left ventricular diastolic Doppler parameters are indeterminate.  2. Left atrial size was severely dilated.  3. Right atrial size was mildly dilated.  4. Mitral valve repair. Moderate mitral valve stenosis.  5. The tricuspid valve is tricuspid valve annuloplasty ring present.  6. Aortic valve regurgitation is trivial by color flow  Doppler.  7. The pulmonic valve was normal in structure. Pulmonic  valve regurgitation is mild by color flow Doppler.  8. There is mild dilatation of the ascending aorta measuring 37 mm.  FINDINGS  Left Ventricle: The left ventricle has normal systolic function, with an ejection fraction of 60-65%. The cavity size was normal. There is no increase in left ventricular wall thickness. Left ventricular diastolic Doppler parameters are indeterminate Right Ventricle: The right ventricle has mildly reduced systolic function. The cavity was normal. There is no increase in right ventricular wall thickness. Left Atrium: left atrial size was severely dilated Right Atrium: right atrial size was mildly dilated. Right atrial pressure is estimated at 3 mmHg. Interatrial Septum: No atrial level shunt detected by color flow Doppler. Pericardium: There is no evidence of pericardial effusion. Mitral Valve: Mitral valve repair. Mitral valve regurgitation is not visualized by color flow Doppler. Moderate mitral valve stenosis. Tricuspid Valve: The tricuspid valve is tricuspid valve annuloplasty ring present. Tricuspid valve regurgitation is mild by color flow Doppler. Aortic Valve: The aortic valve has been repaired/replaced Aortic valve regurgitation is trivial by color flow Doppler. 25 mm [facility] Ease bioprosthetic aortic valve. Pulmonic Valve: The pulmonic valve was normal in structure. Pulmonic valve regurgitation is mild by color flow Doppler. Aorta: There is mild dilatation of the ascending aorta measuring 37 mm. Venous: The inferior vena cava is normal in size with greater than 50% respiratory variability. Additional Comments: Compared with the echo 08/2017, mean gradient across bioprosthetic aortic valve has increased from 8 mmHg to 16 mmHg. Visually the valve opens properly and is well-positioned. Since the last echo, mitral valve has been repaired.  Mitral valve inflow gradient is mild-moderately  elevated. There is no significant mitral regurgitation.   LEFT VENTRICLE PLAX 2D (Teich) LV EF:          72.8 %  Diastology LVIDd:          4.10 cm LV e' lateral:   7.38 cm/s LVIDs:          2.40 cm LV E/e' lateral: 25.6 LV PW:          1.50 cm LV e' medial:    3.49 cm/s LV IVS:         1.50 cm LV E/e' medial:  54.2 LV SV:          54 ml  RIGHT VENTRICLE RV Basal diam:  3.00 cm RV S prime:     9.14 cm/s TAPSE (M-mode): 1.2 cm RVSP:           29.6 mmHg  LEFT ATRIUM              Index       RIGHT ATRIUM           Index LA diam:        5.50 cm  2.83 cm/m  RA Pressure: 3 mmHg LA Vol (A2C):   135.0 ml 69.56 ml/m RA Area:     18.50 cm LA Vol (A4C):   106.0 ml 54.61 ml/m RA Volume:   55.50 ml  28.60 ml/m LA Biplane Vol: 123.0 ml 63.37 ml/m  AORTIC VALVE AV Vmax:           249.00 cm/s AV Vmean:          191.000 cm/s AV VTI:            0.469 m AV Peak Grad:      24.8 mmHg AV Mean Grad:      16.0 mmHg LVOT Vmax:  135.00 cm/s LVOT Vmean:        96.100 cm/s LVOT VTI:          0.258 m LVOT/AV VTI ratio: 0.55   AORTA Ao Root diam: 3.80 cm Ao Asc diam:  3.70 cm  MITRAL VALVE               TRICUSPID VALVE MV Area (PHT): 1.67 cm    TV Peak grad:   3.9 mmHg MV Peak grad:  11.2 mmHg   TV Mean grad:   1.0 mmHg MV Mean grad:  5.7 mmHg    TV Vmax:        0.99 m/s MV Vmax:       1.67 m/s    TV Vmean:       54.2 cm/s MV Vmean:      115.7 cm/s  TV VTI:         0.29 msec MV VTI:        0.52 m      TR Peak grad:   26.6 mmHg MV PHT:        131.66 msec TR Vmax:        258.00 cm/s MV Decel Time: 454 msec    RVSP:           29.6 mmHg MV E velocity: 189.00 cm/s MV A velocity: 143.00 cm/s MV E/A ratio:  1.32    Skeet Latch MD Electronically signed by Skeet Latch MD Signature Date/Time: 03/04/2018/2:41:43 PM      Impression:  Patient is doing well approximately 4 months status post mitral valve repair, tricuspid valve repair, and Maze procedure.  He is maintaining  sinus rhythm.  I personally reviewed the patient's follow-up echocardiogram performed last February which looks quite good.  Left ventricular function appears normal and all of his valves are functioning well with no residual mitral regurgitation or tricuspid regurgitation    Plan:  We have not recommended any changes to the patient's current medications.  Although the patient remains in sinus rhythm, his CHADS-VASC score would be relatively high because of his age, history of hypertension, and history of previous stroke.  He seems to be tolerating Coumadin.  Because he is more than 3 months out from surgery he could potentially be switched back to Eliquis if desired, but the patient is pleased with the fact that it cost much less than Eliquis.  I have encouraged the patient to continue to gradually increase his physical activity without any particular limitations.  We discussed his concerns regarding his the functional "mitral stenosis" reported on his recent echocardiogram.  We also discussed his endurance and plans to continue to exercise in the coming months.  All of his questions have been addressed.  The patient will continue to follow along with Dr. Burt Knack and Truitt Merle.  He will return to our office for routine follow-up and rhythm check next January, approximately 1 year following his surgery.  During the interim he will call and return sooner should specific problems or questions arise.  I spent in excess of 15 minutes during the conduct of this office consultation and >50% of this time involved direct face-to-face encounter with the patient for counseling and/or coordination of their care.    [name]. Roxy Manns, MD 05/26/2018 2:44 PM

## 2018-06-06 ENCOUNTER — Ambulatory Visit
Admission: RE | Admit: 2018-06-06 | Discharge: 2018-06-06 | Disposition: A | Discharge status: Home / Self Care | Payer: Medicare Other | Source: Ambulatory Visit | Attending: Family Medicine | Admitting: Family Medicine

## 2018-06-06 ENCOUNTER — Other Ambulatory Visit: Payer: Self-pay

## 2018-06-06 DIAGNOSIS — R9389 Abnormal findings on diagnostic imaging of other specified body structures: Secondary | ICD-10-CM

## 2018-06-06 MED ORDER — GADOBENATE DIMEGLUMINE 529 MG/ML IV SOLN
15.0000 mL | Freq: Once | INTRAVENOUS | Status: AC | PRN
  Administered 2018-06-06: 15 mL via INTRAVENOUS

## 2018-06-09 ENCOUNTER — Telehealth: Payer: Self-pay

## 2018-06-09 NOTE — Telephone Encounter (Signed)
1. COVID-19 Pre-Screening Questions:  . In the past 7 to 10 days have you had a cough,  shortness of breath, headache, congestion, fever (100 or greater) body aches, chills, sore throat, or sudden loss of taste or sense of smell?no . Have you been around anyone with known Covid 19. . Have you been around anyone who is awaiting Covid 19 test results in the past 7 to 10 days?no . Have you been around anyone who has been exposed to Covid 19, or has mentioned symptoms of Covid 19 within the past 7 to 10 days?no   2. Pt advised of visitor restrictions (no visitors allowed except if needed to conduct the visit). Also advised to arrive at appointment time and wear a mask.  yes   

## 2018-06-10 ENCOUNTER — Other Ambulatory Visit: Payer: Self-pay

## 2018-06-10 ENCOUNTER — Ambulatory Visit (INDEPENDENT_AMBULATORY_CARE_PROVIDER_SITE_OTHER): Payer: Medicare Other | Admitting: *Deleted

## 2018-06-10 DIAGNOSIS — I63411 Cerebral infarction due to embolism of right middle cerebral artery: Secondary | ICD-10-CM

## 2018-06-10 DIAGNOSIS — Z9889 Other specified postprocedural states: Secondary | ICD-10-CM

## 2018-06-10 DIAGNOSIS — I4811 Longstanding persistent atrial fibrillation: Secondary | ICD-10-CM

## 2018-06-10 DIAGNOSIS — Z7901 Long term (current) use of anticoagulants: Secondary | ICD-10-CM

## 2018-06-10 LAB — POCT INR: INR: 1.8 — AB (ref 2.0–3.0)

## 2018-06-10 NOTE — Patient Instructions (Addendum)
   Description   Take 1.5 tablets tonight and tomorrow, then continue on same dosage 1 tablet daily except 1.5 tablets on Mondays and Fridays.  Recheck in 3 weeks. Coumadin Clinic [phone] call with bleeding problems or new medications.

## 2018-06-24 ENCOUNTER — Telehealth: Payer: Self-pay

## 2018-06-24 NOTE — Telephone Encounter (Signed)

## 2018-07-01 ENCOUNTER — Ambulatory Visit (INDEPENDENT_AMBULATORY_CARE_PROVIDER_SITE_OTHER): Payer: Medicare Other

## 2018-07-01 ENCOUNTER — Other Ambulatory Visit: Payer: Self-pay

## 2018-07-01 DIAGNOSIS — Z7901 Long term (current) use of anticoagulants: Secondary | ICD-10-CM | POA: Diagnosis not present

## 2018-07-01 DIAGNOSIS — I4811 Longstanding persistent atrial fibrillation: Secondary | ICD-10-CM | POA: Diagnosis not present

## 2018-07-01 DIAGNOSIS — Z9889 Other specified postprocedural states: Secondary | ICD-10-CM

## 2018-07-01 DIAGNOSIS — I63411 Cerebral infarction due to embolism of right middle cerebral artery: Secondary | ICD-10-CM

## 2018-07-01 LAB — POCT INR: INR: 2 (ref 2.0–3.0)

## 2018-07-01 NOTE — Patient Instructions (Signed)
Description   Take 1.5 tablets today, then resume same dosage 1 tablet daily except 1.5 tablets on Mondays and Fridays.  Recheck in 4 weeks. Coumadin Clinic [phone] call with bleeding problems or new medications.

## 2018-07-16 ENCOUNTER — Telehealth: Payer: Self-pay

## 2018-07-16 NOTE — Telephone Encounter (Signed)

## 2018-07-18 ENCOUNTER — Other Ambulatory Visit: Payer: Self-pay | Admitting: Physician Assistant

## 2018-07-22 ENCOUNTER — Other Ambulatory Visit: Payer: Self-pay | Admitting: *Deleted

## 2018-07-22 ENCOUNTER — Telehealth: Payer: Self-pay | Admitting: Nurse Practitioner

## 2018-07-22 MED ORDER — WARFARIN SODIUM 5 MG PO TABS: ORAL_TABLET | ORAL | 0 refills | Status: DC

## 2018-07-22 NOTE — Telephone Encounter (Signed)

## 2018-07-22 NOTE — Progress Notes (Signed)
CARDIOLOGY OFFICE NOTE  Date:  07/23/2018    Gerald Ape Barbour Jr. Date of Birth: Apr 07, 1935 Medical Record #[id number]  PCP:  Leighton Ruff, MD  Cardiologist:  Servando Snare & Skains/Cooper    Chief Complaint  Patient presents with   Follow-up   Atrial Fibrillation   Cardiac Valve Problem    Follow up visit - seen for Dr. Marlou Porch & Burt Knack    History of Present Illness: Gerald Crossley. is a 83 y.o. male who presents today for a follow up visit. Seen for Dr. Marlou Porch. Primarily follows with me. Has seen Dr. Burt Knack for structural heart.   [name] has a history of a prioraortic valve replacement using abioprosthetictissue valve in 2012,hypertension, atrial fibrillation on long-term anticoagulation, and previous embolic stroke in 2542.  Patient's cardiac history dates back to 2012 when he underwent aortic valve replacement using a66mm Edwards Magna Easestented bovine pericardial tissue valve via right minithoracotomy approach for severe aortic insufficiency. He recovered uneventfully and was followed for a period of time by Dr. Marlou Porch but subsequently lost to follow-up. I have followed him since 2018 and previous echocardiograms document the presence of normal functioning bioprosthetic tissue valve in aortic position with mild mitral regurgitation and normal left ventricular systolic function. In October 2018 he hadan acute right middle cerebral artery stroke associated with dense left-sided hemiplegia. He was promptly treated with catheter directed therapy and he recovered remarkably well. Transthoracic echocardiogram performed at that time revealed normal left ventricular function with normal functioning bioprosthetic tissue valve in aortic position and what was felt to be moderate mitral regurgitation. The patient was notably in atrial fibrillation. Transesophageal echocardiogram was not performed but the patient was started on Eliquis for long-term  anticoagulation. He recovered remarkably well from his stroke and has eventually returned to essentially baseline physical status.He did report decreased exercise tolerance with poor endurance, and he admitted to some exertional shortness of breath with more strenuous activity. However, hecontinuedto walk every day and he exercisedreligiously, including doing push-ups and lifting some weights. Follow-up echocardiogram revealed preserved left ventricular function but worsening mitral regurgitation. He was referred to Dr. Burt Knack for consultation and underwent transesophageal echocardiogram and diagnostic cardiac catheterization on October 14, 2017. TEE revealed normal left ventricular systolic function with normal functioning bioprosthetic tissue valve in aortic position. There was severe mitral regurgitation with mitral valve prolapse including a flail segment involving the middle scallop of the posterior leaflet. There was severe left atrial enlargement. There was severe tricuspid regurgitation with bileaflet prolapse. Catheterization revealed mild nonobstructive coronary artery disease with no change in comparison with catheterization performed in 2012. There were large V waves on wedge tracing consistent with severe mitral regurgitation. Pulmonary artery pressures were mildly elevated.  Patient underwent a redo sternotomy, MV and TV repair, MAZE, and LA clip on 01/30/18. His post op course was uneventful.He did develop some residual urinary retention and required I/O catheterization. He was restarted on Flomax and Proscarfor this. He developed junctional bradycardia and was not started on a beta blocker. He was paced for the first several days post op. He was then put on VVI backup on 01/27.His underlying rhythmwas junctional with heart rate in the low 50's. After transfer from the ICU, his heart rate was increased into the 60-70's. Hewasasymptomatic, even with previous bradycardia.He  was volume over loaded and diuresed. He had ABL anemia. He did not require a post op transfusion.  He was weaned off the insulin drip. He was started on Coumadin  for his MV Repair and MAZE procedure. He wasdischarged home on 5mg  of Coumadin.He was able to be started onLisinopril 5 mg daily.  I then saw him in February for his post op visit - lots of concerns. Was not feeling as well as he wished or thought he should be. Was losing more weight. Was doing a lot of walking and probably was overdoing it. At follow up in March he was doing ok. He and his wife were struggling with the need to social distance - they eat out most meals - cardiac status ok.   I last saw him for a telehealth visit back in May - lots of questions and concerns. Still with no stamina - not at the level that he thought he should be at but was felt to be doing pretty good. Lightheaded with position changes. He was to have a repeat MRI on his spine - there was concern at L3 of a possible lesion - he has had no known malignancy (actually had extensive CT scans for weight loss in the past). This turned out to be benign with no further imaging warranted. He was concerned about the results of his last echo.    I discussed his care with Dr. Burt Knack - we elected to keep him on coumadin anticoagulation given valvular disease and AF. This is actually a cheaper option for him as well.  His echo had been reviewed by Dr. Roxy Manns & Dr. Burt Knack. No need for any intervention for the mitral stenosis.   The patient does not have symptoms concerning for COVID-19 infection (fever, chills, cough, or new shortness of breath).   Comes in today. Here alone. His wife was able to have her surgery and has done ok. He feels like he is doing well. No chest pain. Breathing is good. Stamina now good. He was walking in the Am and biking in the PM along with his pushups. He has had to cut back. 5 days ago he had a fall - was talking on the phone and missed a step  - but still able to walk 1/2 mile already today. He notes some pain in the left knee. Seems to only hurt with bending. He has been using ice. He is wondering if he should see someone but it is improving and not getting worse. He has been to the dentist - using SBE. Asking about still drinking Ensure. Weight is up some. Asking about eating out at restaurants - I told him I was not doing that. No bleeding/bruising.   Past Medical History:  Diagnosis Date   Bladder stones    Borderline diabetes    BPH (benign prostatic hyperplasia)    Coronary artery disease    cardiologist-  dr Nat Math gerhart NP--- per cath 06-02-2010 non-obstructive cad pLAD 30-40%   Diverticulosis of colon    Dysrhythmia    afib   Gout    Heart murmur    History of adenomatous polyp of colon    2002-- tubular adenoma   History of aortic insufficiency    severe -- s/p  AVR 08-03-2010   History of small bowel obstruction    02/ 2007 mechanical sbo s/p  surgical intervention;  partial sbo 09/ 2011 and 03-20-2011 resolved without surgical intervention   History of urinary retention    HTN (hypertension)    Peripheral neuropathy    Persistent atrial fibrillation    S/P aortic valve replacement with prosthetic valve 08/03/2010   tissue valve  S/P Maze operation for atrial fibrillation 01/30/2018   Complete bilateral atrial lesion set using bipolar radiofrequency and cryothermy with clipping of LA appendage   S/P MVR (mitral valve repair) 01/30/2018   Complex valvuloplasty including artificial Gore-tex neochord placement x4 and Carbo medics Annuloflex ring annuloplasty, size 28   S/P patent foramen ovale closure 08/03/2010   at same time AVR   S/P tricuspid valve repair 01/30/2018   Using an MC3 Annuloplasty ring, size 28   Stroke [facility])    Tricuspid regurgitation     Past Surgical History:  Procedure Laterality Date   CARDIAC CATHETERIZATION  06-02-2010  dr Marlou Porch   non-obstructive cad-  pLAD 30-40%/  normal LVSF/  severe AI   CARDIOVASCULAR STRESS TEST  04/12/2016   Low risk nuclear perfusion study w/ no significant reversible ischemia/  normal LV function and wall motion ,  stress ef 60%/  61mm inferior and lateral scooped ST-segment depression w/ exercise (may be repolarization abnormality), exercise capacity was moderately reduced   CATARACT EXTRACTION W/ INTRAOCULAR LENS  IMPLANT, BILATERAL  02/2010   CLIPPING OF ATRIAL APPENDAGE N/A 01/30/2018   Procedure: CLIPPING OF LEFT ATRIAL APPENDAGE USING ATRICLIP PRO2 45MM;  Surgeon: Rexene Alberts, MD;  Location: [place];  Service: Open Heart Surgery;  Laterality: N/A;   CYSTOSCOPY WITH LITHOLAPAXY N/A 06/05/2016   Procedure: CYSTOSCOPY WITH LITHOLAPAXY and fulgarization of bladder neck;  Surgeon: Irine Seal, MD;  Location: [facility];  Service: Urology;  Laterality: N/A;   EXPLORATORY LAPARTOMY /  CHOLECYSTECTOMY  02/28/2005   for Small  bowel obstruction (mechnical)   IR ANGIO EXTRACRAN SEL COM CAROTID INNOMINATE UNI L MOD SED  10/24/2016   IR ANGIO VERTEBRAL SEL SUBCLAVIAN INNOMINATE BILAT MOD SED  10/24/2016   IR PERCUTANEOUS ART THROMBECTOMY/INFUSION INTRACRANIAL INC DIAG ANGIO  10/24/2016   IR RADIOLOGIST EVAL & MGMT  12/05/2016   LEFT KNEE ARTHROSCOPY  2006   MAZE N/A 01/30/2018   Procedure: MAZE;  Surgeon: Rexene Alberts, MD;  Location: [place];  Service: Open Heart Surgery;  Laterality: N/A;   MITRAL VALVE REPAIR N/A 01/30/2018   Procedure: MITRAL VALVE REPAIR (MVR) USING CARBOMEDICS ANNULOFLEX SIZE 28;  Surgeon: Rexene Alberts, MD;  Location: [place];  Service: Open Heart Surgery;  Laterality: N/A;   RADIOLOGY WITH ANESTHESIA N/A 10/24/2016   Procedure: RADIOLOGY WITH ANESTHESIA;  Surgeon: Luanne Bras, MD;  Location: [place];  Service: Radiology;  Laterality: N/A;   RIGHT FOOT SURGERY     RIGHT MINIATURE ANTERIOR THORACOTOMY FOR AORTIC VALVE REPLACEMENT AND CLOSURE PATENT FORAMEN OVALE   08-03-2010  DR Levada Schilling Magna-ease pericardial tissue valve (12mm)   RIGHT/LEFT HEART CATH AND CORONARY ANGIOGRAPHY N/A 10/14/2017   Procedure: RIGHT/LEFT HEART CATH AND CORONARY ANGIOGRAPHY;  Surgeon: [name], MD;  Location: [place] CV [facility];  Service: Cardiovascular;  Laterality: N/A;   TEE WITHOUT CARDIOVERSION N/A 10/14/2017   Procedure: TRANSESOPHAGEAL ECHOCARDIOGRAM (TEE);  Surgeon: Josue Hector, MD;  Location[facility] ENDOSCOPY;  Service: Cardiovascular;  Laterality: N/A;   TRANSTHORACIC ECHOCARDIOGRAM  05/30/2016  dr skains   moderate  LVH ef 60-65%/  bioprothesis aortic valve present ,normal grandient and no AI /  mild MV calcification , moderate MR /  mild PR/ moderate TR/  PASP 75mmHg/ (RA denisty was identified 04-27-2016 echo) and is seen again today, this is likely a promient eustacian ridge, atrium is normal size   TRICUSPID VALVE REPLACEMENT N/A 01/30/2018   Procedure: TRICUSPID VALVE  REPAIR USING MC3 SIZE 28;  Surgeon: Rexene Alberts, MD;  Location: [place];  Service: Open Heart Surgery;  Laterality: N/A;     Medications: Current Meds  Medication Sig   allopurinol (ZYLOPRIM) 300 MG tablet Take 150 mg by mouth daily.   amoxicillin (AMOXIL) 500 MG capsule Take 2,000 mg by mouth See admin instructions. Take 2000 mg by mouth 1 hour prior to dental appointment   aspirin EC 81 MG EC tablet Take 1 tablet (81 mg total) by mouth daily.   b complex vitamins tablet Take 1 tablet by mouth daily.   diphenhydrAMINE (BENADRYL) 2 % cream Apply 1 application topically daily as needed for itching.   finasteride (PROSCAR) 5 MG tablet Take 1 tablet (5 mg total) by mouth daily.   lisinopril (ZESTRIL) 5 MG tablet Take 5 mg by mouth daily.   tamsulosin (FLOMAX) 0.4 MG CAPS capsule Take 2 capsules (0.8 mg total) by mouth daily after breakfast.   traZODone (DESYREL) 50 MG tablet TAKE 1 TABLET BY MOUTH ONCE DAILY AT BEDTIME AS NEEDED   triamcinolone cream (KENALOG) 0.1 %  Apply 1 application topically daily as needed.   warfarin (COUMADIN) 5 MG tablet Take 1 tablet daily except 1.5 tablets on Monday and Friday or as directed By Anticoagulation Clinic.     Allergies: Allergies  Allergen Reactions   Sulfa Antibiotics Other (See Comments)    Granulocytosis    Social History: The patient  reports that he has never smoked. He has never used smokeless tobacco. He reports current alcohol use. He reports that he does not use drugs.   Family History: The patient's family history includes Brain cancer in his father; Heart disease in his mother.   Review of Systems: Please see the history of present illness.   All other systems are reviewed and negative.   Physical Exam: VS:  BP 140/90 (BP Location: Left Arm, Patient Position: Sitting, Cuff Size: Normal)    Pulse 63    Ht 6' (1.829 m)    Wt 168 lb 12.8 oz (76.6 kg)    SpO2 99% Comment: at rest   BMI 22.89 kg/m  .  BMI Body mass index is 22.89 kg/m.  Wt Readings from Last 3 Encounters:  07/23/18 168 lb 12.8 oz (76.6 kg)  05/26/18 166 lb (75.3 kg)  05/13/18 160 lb (72.6 kg)    General: Pleasant. Alert and in no acute distress.  He has been able to gain some weight. He looks good.  HEENT: Normal.  Neck: Supple, no JVD, carotid bruits, or masses noted.  Cardiac: Regular rate and rhythm. No murmurs that I appreciate.  Respiratory:  Lungs are clear to auscultation bilaterally with normal work of breathing.  GI: Soft and nontender.  MS: No deformity or atrophy. Gait and ROM intact. He has a brace on his left knee.  Skin: Warm and dry. Color is normal.  Neuro:  Strength and sensation are intact and no gross focal deficits noted.  Psych: Alert, appropriate and with normal affect.   LABORATORY DATA:  EKG:  EKG is not ordered today.  Lab Results  Component Value Date   WBC 6.7 03/24/2018   HGB 11.6 (L) 03/24/2018   HCT 35.6 (L) 03/24/2018   PLT 196 03/24/2018   GLUCOSE 96 03/24/2018   CHOL 87  10/25/2016   TRIG 54 10/25/2016   HDL 26 (L) 10/25/2016   LDLCALC 50 10/25/2016   ALT 23 01/20/2018   AST 25 01/20/2018   NA  144 03/24/2018   K 4.6 03/24/2018   CL 106 03/24/2018   CREATININE 0.89 03/24/2018   BUN 23 03/24/2018   CO2 21 03/24/2018   TSH 1.920 05/02/2017   INR 2.0 07/01/2018   HGBA1C 5.9 (H) 01/20/2018       BNP (last 3 results) No results for input(s): BNP in the last 8760 hours.  ProBNP (last 3 results) No results for input(s): PROBNP in the last 8760 hours.   Other Studies Reviewed Today:  EchoIMPRESSIONS2/2020  1. The left ventricle has normal systolic function with an ejection fraction of 60-65%. The cavity size was normal. Left ventricular diastolic Doppler parameters are indeterminate. 2. Left atrial size was severely dilated. 3. Right atrial size was mildly dilated. 4. Mitral valve repair. Moderate mitral valve stenosis. 5. The tricuspid valve is tricuspid valve annuloplasty ring present. 6. Aortic valve regurgitation is trivial by color flow Doppler. 7. The pulmonic valve was normal in structure. Pulmonic valve regurgitation is mild by color flow Doppler. 8. There is mild dilatation of the ascending aorta measuring 37 mm.  Note has also been reviewed by Dr. Roxy Manns - He has a 69mm ring and the anterior leaflet moves normally with no leaflet restriction. There is flow acceleration across the valve which is typical following mitral valve repair. Mean gradient was estimated 5-6 mmHg which he may notice clinically during exercise, but still correlates to calculated MVA >1.5. As active as he is the cardiac output is likely relatively high. Ventricular compliance plays a role as well. Based on the 2D views of the valve orifice it appears that the MVA would be considerably larger than 1.5 using planimetry, which makes sense given the 56mm ring. In my experience, patients who are very active and exercise strenuously often times feel like their  peak exercise capacity is slow to recover after mitral valve repair, and I suspect this may be related to the sudden change from severe MR to mild functional mitral stenosis, making it more difficult to generate high cardiac output. 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 I will discuss with him when I see him.    Procedure (s) 01/30/2018:  Mitral Valve Repair Redo median sternotomy Percutaneous arterial and venous cannulation Complexmitralvalvuloplasty including artificial Gore-tex neochord placement x4 Sorin Carbomedics Annuloflex ringannuloplasty (size59mm,catalog# [id number], serial #[id number])   TricuspidValve Repair Edwards mc3 ringannuloplasty (size2mm,model# [id number], serial [id number])   Maze Procedure complete bilateral atrial lesion set using bipolar radiofrequency and cryothermy ablation clipping of left atrial appendage (Atricure Pro245 left atrial clip,size 66mm) Removal of large, anomalous muscle band traversing the right atriumby Dr. Roxy Manns on 01/30/2018   RIGHT/LEFT HEART CATH AND CORONARY ANGIOGRAPHY10/2019  Conclusion     Prox Cx to Mid Cx lesion is 30% stenosed.  Ost LAD to Prox LAD lesion is 30% stenosed.  Prox LAD to Mid LAD lesion is 40% stenosed.  1. Nonobstructive coronary artery disease with no significant change compared to 2012 cardiac catheterization study 2. Large V waves in the pulmonary capillary wedge tracing consistent with severe mitral insufficiency  Recommend: referral to cardiac surgery for further evaluation of treatment options (surgical mitral valve repair + Maze versus percutaneous mitral valve repair)  Resume apixaban tomorrow morning     ASSESSMENT & PLAN:    1. Post op MV repair, TV repair, MAZE and clipping of LA appendage -we have added back low dose beta blocker to help  increase his filling time - Dr. Guy Sandifer review of the  echo has been noted - he is doing very well. No changes made today. Recent labs noted.   2. Prior AVR- see above.  3. Prior stroke- he is anticoagulated.No problems noted.INR today.   4. Persistent AF (probably related to valvular heart disease) - s/p MAZE - he was in NSR on last EKG - in sinus by exam today.   5. Chronic anticoagulation - on Coumadin -have elected to continue - it is a cheaper option for him - he has had a fall - no real injury sustained.   6. HTN -BP recheck by me is 136/85. He will monitor periodically.    7. Positional lightheadedness - resolved.   8. ?L3 lesion - repeat MRI showed this to be benign - no further follow up noted.   9. Left knee pain - using the brace is fine - he can use ice/heat as tolerated. If fails to improve, see PCP but sounds like this is already getting better.   10. COVID-19 Education: The signs and symptoms of COVID-19 were discussed with the patient and how to seek care for testing (follow up with PCP or arrange E-visit).  The importance of social distancing, staying at home, hand hygiene and wearing a mask when out in public were discussed today. I told him that I would not be going to restaurants to "eat in" at this time.   Current medicines are reviewed with the patient today.  The patient does not have concerns regarding medicines other than what has been noted above.  The following changes have been made:  See above.  Labs/ tests ordered today include:   No orders of the defined types were placed in this encounter.    Disposition:   FU with me in about 4 months.   Patient is agreeable to this plan and will call if any problems develop in the interim.   SignedTruitt Merle, NP  07/23/2018 9:22 AM  [facility] [address] [place], [place]  [address] Phone: [phone] Fax: [phone]

## 2018-07-23 ENCOUNTER — Ambulatory Visit (INDEPENDENT_AMBULATORY_CARE_PROVIDER_SITE_OTHER): Payer: Medicare Other | Admitting: *Deleted

## 2018-07-23 ENCOUNTER — Ambulatory Visit (INDEPENDENT_AMBULATORY_CARE_PROVIDER_SITE_OTHER): Payer: Medicare Other | Admitting: Nurse Practitioner

## 2018-07-23 ENCOUNTER — Encounter: Payer: Self-pay | Admitting: Nurse Practitioner

## 2018-07-23 ENCOUNTER — Other Ambulatory Visit: Payer: Self-pay

## 2018-07-23 VITALS — BP 140/90 | HR 63 | Ht 72.0 in | Wt 168.8 lb

## 2018-07-23 DIAGNOSIS — Z952 Presence of prosthetic heart valve: Secondary | ICD-10-CM

## 2018-07-23 DIAGNOSIS — I5032 Chronic diastolic (congestive) heart failure: Secondary | ICD-10-CM

## 2018-07-23 DIAGNOSIS — Z7901 Long term (current) use of anticoagulants: Secondary | ICD-10-CM

## 2018-07-23 DIAGNOSIS — Z7189 Other specified counseling: Secondary | ICD-10-CM | POA: Diagnosis not present

## 2018-07-23 DIAGNOSIS — I4811 Longstanding persistent atrial fibrillation: Secondary | ICD-10-CM

## 2018-07-23 DIAGNOSIS — I4819 Other persistent atrial fibrillation: Secondary | ICD-10-CM | POA: Diagnosis not present

## 2018-07-23 DIAGNOSIS — Z9889 Other specified postprocedural states: Secondary | ICD-10-CM

## 2018-07-23 DIAGNOSIS — I63411 Cerebral infarction due to embolism of right middle cerebral artery: Secondary | ICD-10-CM

## 2018-07-23 LAB — POCT INR: INR: 2.2 (ref 2.0–3.0)

## 2018-07-23 NOTE — Patient Instructions (Addendum)
Description   Take 1.5 tablets today (missed 7/14/20200), then resume same dosage 1 tablet daily except 1.5 tablets on Mondays and Fridays.  Recheck in 4 weeks. Coumadin Clinic [phone] call with bleeding problems or new medications.

## 2018-07-23 NOTE — Patient Instructions (Addendum)
After Visit Summary:  We will be checking the following labs today - NONE   Medication Instructions:    Continue with your current medicines.    If you need a refill on your cardiac medications before your next appointment, please call your pharmacy.     Testing/Procedures To Be Arranged:  N/A  Follow-Up:   See me in about 4 months.     At [facility], you and your health needs are our priority.  As part of our continuing mission to provide you with exceptional heart care, we have created designated Provider Care Teams.  These Care Teams include your primary Cardiologist (physician) and Advanced Practice Providers (APPs -  Physician Assistants and Nurse Practitioners) who all work together to provide you with the care you need, when you need it.  Special Instructions:  . Stay safe, stay home, wash your hands for at least 20 seconds and wear a mask when out in public.  . It was good to talk with you today.  Marland Kitchen Keep a check on your BP periodically for me . Keep up the good work . Ok to drink the Ensure as you feel you need it . Alternate ice/heat for your knee - if it fails to improve over the next week - see your PCP   Call the [place] office at [phone] if you have any questions, problems or concerns.

## 2018-07-24 ENCOUNTER — Other Ambulatory Visit: Payer: Self-pay

## 2018-07-24 MED ORDER — LISINOPRIL 5 MG PO TABS: 5.0000 mg | ORAL_TABLET | Freq: Every day | ORAL | 3 refills | Status: DC

## 2018-08-20 ENCOUNTER — Other Ambulatory Visit: Payer: Self-pay

## 2018-08-20 ENCOUNTER — Ambulatory Visit (INDEPENDENT_AMBULATORY_CARE_PROVIDER_SITE_OTHER): Payer: Medicare Other

## 2018-08-20 DIAGNOSIS — I63411 Cerebral infarction due to embolism of right middle cerebral artery: Secondary | ICD-10-CM | POA: Diagnosis not present

## 2018-08-20 DIAGNOSIS — Z9889 Other specified postprocedural states: Secondary | ICD-10-CM | POA: Diagnosis not present

## 2018-08-20 DIAGNOSIS — Z7901 Long term (current) use of anticoagulants: Secondary | ICD-10-CM

## 2018-08-20 DIAGNOSIS — I4811 Longstanding persistent atrial fibrillation: Secondary | ICD-10-CM | POA: Diagnosis not present

## 2018-08-20 LAB — POCT INR: INR: 2.6 (ref 2.0–3.0)

## 2018-08-20 NOTE — Patient Instructions (Addendum)
Description   Continue on same dosage 1 tablet daily except 1.5 tablets on Mondays and Fridays.  Recheck in 6 weeks. Coumadin Clinic [phone] call with bleeding problems or new medications.

## 2018-09-01 ENCOUNTER — Telehealth: Payer: Self-pay | Admitting: Nurse Practitioner

## 2018-09-01 NOTE — Telephone Encounter (Signed)
New Message   STAT if patient feels like he/she is going to faint   1) Are you dizzy now? Yes  2) Do you feel faint or have you passed out? Yes  3) Do you have any other symptoms? Lightheadedness, dizziness,   4) Have you checked your HR and BP (record if available)? 100/70 hr 91

## 2018-09-01 NOTE — Telephone Encounter (Signed)
D/C Lisinopril altogether.  Continue to monitor BP.   Cecille Rubin

## 2018-09-01 NOTE — Telephone Encounter (Signed)
Spoke with pt and pt  notes lightheadedness and dizzy feeling Pt has taken B/P and when stands up B/P drops significantly 100/70 and with little activity dropped more to 85/64.Per pt has not taken Lisinopril this am Pt states feels fine with sitting or lying down .Instructed pt to eat small salty snack and see if this helps with low B/P Will forward to Truitt Merle NP for review and recommendations./cy

## 2018-09-01 NOTE — Telephone Encounter (Signed)
S/w pt is aware of recommendation's. Pt is to discontinue lisinopril, medication list updated and send in bp readings on mychart.

## 2018-09-05 ENCOUNTER — Other Ambulatory Visit: Payer: Self-pay

## 2018-09-05 ENCOUNTER — Encounter (HOSPITAL_COMMUNITY): Payer: Self-pay | Admitting: Emergency Medicine

## 2018-09-05 ENCOUNTER — Inpatient Hospital Stay (HOSPITAL_COMMUNITY)
Admission: EM | Admit: 2018-09-05 | Discharge: 2018-09-08 | DRG: 378 | Disposition: A | Discharge status: Home / Self Care | Payer: Medicare Other | Attending: Internal Medicine | Admitting: Internal Medicine

## 2018-09-05 DIAGNOSIS — D500 Iron deficiency anemia secondary to blood loss (chronic): Secondary | ICD-10-CM | POA: Diagnosis not present

## 2018-09-05 DIAGNOSIS — D62 Acute posthemorrhagic anemia: Secondary | ICD-10-CM | POA: Diagnosis present

## 2018-09-05 DIAGNOSIS — Z7982 Long term (current) use of aspirin: Secondary | ICD-10-CM

## 2018-09-05 DIAGNOSIS — E119 Type 2 diabetes mellitus without complications: Secondary | ICD-10-CM | POA: Diagnosis not present

## 2018-09-05 DIAGNOSIS — D120 Benign neoplasm of cecum: Secondary | ICD-10-CM | POA: Diagnosis present

## 2018-09-05 DIAGNOSIS — Z9889 Other specified postprocedural states: Secondary | ICD-10-CM

## 2018-09-05 DIAGNOSIS — R42 Dizziness and giddiness: Secondary | ICD-10-CM | POA: Diagnosis not present

## 2018-09-05 DIAGNOSIS — K2971 Gastritis, unspecified, with bleeding: Secondary | ICD-10-CM | POA: Diagnosis not present

## 2018-09-05 DIAGNOSIS — Z952 Presence of prosthetic heart valve: Secondary | ICD-10-CM

## 2018-09-05 DIAGNOSIS — Z8249 Family history of ischemic heart disease and other diseases of the circulatory system: Secondary | ICD-10-CM

## 2018-09-05 DIAGNOSIS — K922 Gastrointestinal hemorrhage, unspecified: Secondary | ICD-10-CM | POA: Diagnosis not present

## 2018-09-05 DIAGNOSIS — K625 Hemorrhage of anus and rectum: Secondary | ICD-10-CM

## 2018-09-05 DIAGNOSIS — Z79899 Other long term (current) drug therapy: Secondary | ICD-10-CM

## 2018-09-05 DIAGNOSIS — N400 Enlarged prostate without lower urinary tract symptoms: Secondary | ICD-10-CM | POA: Diagnosis present

## 2018-09-05 DIAGNOSIS — Z86010 Personal history of colonic polyps: Secondary | ICD-10-CM

## 2018-09-05 DIAGNOSIS — I4811 Longstanding persistent atrial fibrillation: Secondary | ICD-10-CM | POA: Diagnosis present

## 2018-09-05 DIAGNOSIS — B3781 Candidal esophagitis: Secondary | ICD-10-CM | POA: Diagnosis present

## 2018-09-05 DIAGNOSIS — K5792 Diverticulitis of intestine, part unspecified, without perforation or abscess without bleeding: Secondary | ICD-10-CM | POA: Diagnosis present

## 2018-09-05 DIAGNOSIS — I10 Essential (primary) hypertension: Secondary | ICD-10-CM | POA: Diagnosis not present

## 2018-09-05 DIAGNOSIS — Q211 Atrial septal defect: Secondary | ICD-10-CM

## 2018-09-05 DIAGNOSIS — M1A9XX0 Chronic gout, unspecified, without tophus (tophi): Secondary | ICD-10-CM | POA: Diagnosis present

## 2018-09-05 DIAGNOSIS — D649 Anemia, unspecified: Secondary | ICD-10-CM | POA: Diagnosis present

## 2018-09-05 DIAGNOSIS — E1142 Type 2 diabetes mellitus with diabetic polyneuropathy: Secondary | ICD-10-CM | POA: Diagnosis present

## 2018-09-05 DIAGNOSIS — Z9841 Cataract extraction status, right eye: Secondary | ICD-10-CM

## 2018-09-05 DIAGNOSIS — Z953 Presence of xenogenic heart valve: Secondary | ICD-10-CM

## 2018-09-05 DIAGNOSIS — Z882 Allergy status to sulfonamides status: Secondary | ICD-10-CM

## 2018-09-05 DIAGNOSIS — Z9842 Cataract extraction status, left eye: Secondary | ICD-10-CM

## 2018-09-05 DIAGNOSIS — Z7901 Long term (current) use of anticoagulants: Secondary | ICD-10-CM

## 2018-09-05 DIAGNOSIS — I4821 Permanent atrial fibrillation: Secondary | ICD-10-CM | POA: Diagnosis present

## 2018-09-05 DIAGNOSIS — Z8679 Personal history of other diseases of the circulatory system: Secondary | ICD-10-CM

## 2018-09-05 DIAGNOSIS — Z8673 Personal history of transient ischemic attack (TIA), and cerebral infarction without residual deficits: Secondary | ICD-10-CM

## 2018-09-05 DIAGNOSIS — Z961 Presence of intraocular lens: Secondary | ICD-10-CM | POA: Diagnosis present

## 2018-09-05 DIAGNOSIS — Z20828 Contact with and (suspected) exposure to other viral communicable diseases: Secondary | ICD-10-CM | POA: Diagnosis present

## 2018-09-05 DIAGNOSIS — I4891 Unspecified atrial fibrillation: Secondary | ICD-10-CM | POA: Diagnosis present

## 2018-09-05 DIAGNOSIS — I4819 Other persistent atrial fibrillation: Secondary | ICD-10-CM

## 2018-09-05 DIAGNOSIS — K5730 Diverticulosis of large intestine without perforation or abscess without bleeding: Secondary | ICD-10-CM | POA: Diagnosis present

## 2018-09-05 DIAGNOSIS — M109 Gout, unspecified: Secondary | ICD-10-CM | POA: Diagnosis present

## 2018-09-05 DIAGNOSIS — Z87438 Personal history of other diseases of male genital organs: Secondary | ICD-10-CM | POA: Diagnosis present

## 2018-09-05 DIAGNOSIS — I2510 Atherosclerotic heart disease of native coronary artery without angina pectoris: Secondary | ICD-10-CM | POA: Diagnosis present

## 2018-09-05 LAB — CBC
HCT: 28 % — ABNORMAL LOW (ref 39.0–52.0)
HCT: 25.5 % — ABNORMAL LOW (ref 39.0–52.0)
Hemoglobin: 8.9 g/dL — ABNORMAL LOW (ref 13.0–17.0)
Hemoglobin: 8.2 g/dL — ABNORMAL LOW (ref 13.0–17.0)
MCH: 33.1 pg (ref 26.0–34.0)
MCH: 33.2 pg (ref 26.0–34.0)
MCHC: 31.8 g/dL (ref 30.0–36.0)
MCHC: 32.2 g/dL (ref 30.0–36.0)
MCV: 104.1 fL — ABNORMAL HIGH (ref 80.0–100.0)
MCV: 103.2 fL — ABNORMAL HIGH (ref 80.0–100.0)
Platelets: 176 10*3/uL (ref 150–400)
Platelets: 154 10*3/uL (ref 150–400)
RBC: 2.69 MIL/uL — ABNORMAL LOW (ref 4.22–5.81)
RBC: 2.47 MIL/uL — ABNORMAL LOW (ref 4.22–5.81)
RDW: 14.7 % (ref 11.5–15.5)
RDW: 14.6 % (ref 11.5–15.5)
WBC: 4.9 10*3/uL (ref 4.0–10.5)
WBC: 6.2 10*3/uL (ref 4.0–10.5)
nRBC: 0 % (ref 0.0–0.2)
nRBC: 0 % (ref 0.0–0.2)

## 2018-09-05 LAB — COMPREHENSIVE METABOLIC PANEL
ALT: 17 U/L (ref 0–44)
AST: 24 U/L (ref 15–41)
Albumin: 4.3 g/dL (ref 3.5–5.0)
Alkaline Phosphatase: 57 U/L (ref 38–126)
Anion gap: 8 (ref 5–15)
BUN: 21 mg/dL (ref 8–23)
CO2: 25 mmol/L (ref 22–32)
Calcium: 9 mg/dL (ref 8.9–10.3)
Chloride: 108 mmol/L (ref 98–111)
Creatinine, Ser: 1.04 mg/dL (ref 0.61–1.24)
GFR calc Af Amer: >60 mL/min (ref >60)
GFR calc non Af Amer: >60 mL/min (ref >60)
Glucose, Bld: 116 mg/dL — ABNORMAL HIGH (ref 70–99)
Potassium: 4.7 mmol/L (ref 3.5–5.1)
Sodium: 141 mmol/L (ref 135–145)
Total Bilirubin: 0.7 mg/dL (ref 0.3–1.2)
Total Protein: 7.6 g/dL (ref 6.5–8.1)

## 2018-09-05 LAB — RETICULOCYTES
Immature Retic Fract: 28.6 % — ABNORMAL HIGH (ref 2.3–15.9)
RBC.: 2.73 MIL/uL — ABNORMAL LOW (ref 4.22–5.81)
Retic Count, Absolute: 107 10*3/uL (ref 19.0–186.0)
Retic Ct Pct: 3.9 % — ABNORMAL HIGH (ref 0.4–3.1)

## 2018-09-05 LAB — IRON AND TIBC
Iron: 43 ug/dL — ABNORMAL LOW (ref 45–182)
Saturation Ratios: 12 % — ABNORMAL LOW (ref 17.9–39.5)
TIBC: 368 ug/dL (ref 250–450)
UIBC: 325 ug/dL

## 2018-09-05 LAB — TYPE AND SCREEN
ABO/RH(D): O POS
Antibody Screen: NEGATIVE

## 2018-09-05 LAB — PROTIME-INR
INR: 2.3 — ABNORMAL HIGH (ref 0.8–1.2)
Prothrombin Time: 25.1 seconds — ABNORMAL HIGH (ref 11.4–15.2)

## 2018-09-05 LAB — VITAMIN B12: Vitamin B-12: 552 pg/mL (ref 180–914)

## 2018-09-05 LAB — GLUCOSE, CAPILLARY: Glucose-Capillary: 175 mg/dL — ABNORMAL HIGH (ref 70–99)

## 2018-09-05 LAB — FERRITIN: Ferritin: 19 ng/mL — ABNORMAL LOW (ref 24–336)

## 2018-09-05 LAB — SARS CORONAVIRUS 2 BY RT PCR (HOSPITAL ORDER, PERFORMED IN ~~LOC~~ HOSPITAL LAB): SARS Coronavirus 2: NEGATIVE

## 2018-09-05 LAB — POC OCCULT BLOOD, ED: Fecal Occult Bld: POSITIVE — AB

## 2018-09-05 LAB — MRSA PCR SCREENING: MRSA by PCR: NEGATIVE

## 2018-09-05 LAB — FOLATE: Folate: 24.1 ng/mL (ref >5.9)

## 2018-09-05 MED ORDER — ALLOPURINOL 300 MG PO TABS
150.0000 mg | ORAL_TABLET | Freq: Every day | ORAL | Status: DC
  Administered 2018-09-06 – 2018-09-08 (×3): 150 mg via ORAL
  Filled 2018-09-05: qty 1
  Filled 2018-09-05 (×2): qty 2

## 2018-09-05 MED ORDER — PANTOPRAZOLE SODIUM 40 MG IV SOLR
40.0000 mg | Freq: Two times a day (BID) | INTRAVENOUS | Status: DC
  Administered 2018-09-05 – 2018-09-08 (×6): 40 mg via INTRAVENOUS
  Filled 2018-09-05 (×6): qty 40

## 2018-09-05 MED ORDER — CHLORHEXIDINE GLUCONATE CLOTH 2 % EX PADS
6.0000 | MEDICATED_PAD | Freq: Every day | CUTANEOUS | Status: DC
  Administered 2018-09-05 – 2018-09-08 (×3): 6 via TOPICAL

## 2018-09-05 MED ORDER — ATENOLOL 25 MG PO TABS
12.5000 mg | ORAL_TABLET | Freq: Every day | ORAL | Status: DC
  Administered 2018-09-06 – 2018-09-08 (×3): 12.5 mg via ORAL
  Filled 2018-09-05 (×3): qty 1

## 2018-09-05 MED ORDER — SODIUM CHLORIDE 0.9 % IV SOLN
INTRAVENOUS | Status: DC
  Administered 2018-09-05: 23:00:00 via INTRAVENOUS

## 2018-09-05 MED ORDER — FINASTERIDE 5 MG PO TABS
5.0000 mg | ORAL_TABLET | Freq: Every day | ORAL | Status: DC
  Administered 2018-09-06 – 2018-09-08 (×3): 5 mg via ORAL
  Filled 2018-09-05 (×3): qty 1

## 2018-09-05 MED ORDER — HYDROCODONE-ACETAMINOPHEN 5-325 MG PO TABS: 1.0000 | ORAL_TABLET | ORAL | Status: DC | PRN

## 2018-09-05 MED ORDER — ACETAMINOPHEN 325 MG PO TABS: 650.0000 mg | ORAL_TABLET | Freq: Four times a day (QID) | ORAL | Status: DC | PRN

## 2018-09-05 MED ORDER — ACETAMINOPHEN 650 MG RE SUPP: 650.0000 mg | Freq: Four times a day (QID) | RECTAL | Status: DC | PRN

## 2018-09-05 MED ORDER — LABETALOL HCL 5 MG/ML IV SOLN
10.0000 mg | INTRAVENOUS | Status: DC | PRN
  Filled 2018-09-05: qty 4

## 2018-09-05 MED ORDER — ONDANSETRON HCL 4 MG/2ML IJ SOLN: 4.0000 mg | Freq: Four times a day (QID) | INTRAMUSCULAR | Status: DC | PRN

## 2018-09-05 MED ORDER — ONDANSETRON HCL 4 MG PO TABS: 4.0000 mg | ORAL_TABLET | Freq: Four times a day (QID) | ORAL | Status: DC | PRN

## 2018-09-05 MED ORDER — TRAZODONE HCL 50 MG PO TABS
50.0000 mg | ORAL_TABLET | Freq: Every evening | ORAL | Status: DC | PRN
  Administered 2018-09-06: 50 mg via ORAL
  Filled 2018-09-05: qty 1

## 2018-09-05 MED ORDER — INSULIN ASPART 100 UNIT/ML ~~LOC~~ SOLN
0.0000 [IU] | SUBCUTANEOUS | Status: DC
  Administered 2018-09-06 (×2): 1 [IU] via SUBCUTANEOUS

## 2018-09-05 MED ORDER — TAMSULOSIN HCL 0.4 MG PO CAPS
0.8000 mg | ORAL_CAPSULE | Freq: Every day | ORAL | Status: DC
  Administered 2018-09-05 – 2018-09-07 (×3): 0.8 mg via ORAL
  Filled 2018-09-05 (×3): qty 2

## 2018-09-05 NOTE — ED Provider Notes (Signed)
[facility] DEPT Provider Note   CSN: [id number] Arrival date & time: 09/05/18  1341     History   Chief Complaint Chief Complaint  Patient presents with  . Dizziness  . sent from [facility] for low Hgb  . Abnormal Lab    HPI Gerald Alonzo. is a 83 y.o. male.     HPI   He presents for evaluation of dizziness.  Dizziness is gradual in onset and worsening over the last week.  Dizziness is exertional and orthostatic.  He denies shortness of breath, chest pain, fever, chills, nausea, vomiting, anorexia, diarrhea, change in color of urine or stool.  He denies back pain or abdominal pain.  He states he last had a colonoscopy, 2 years ago and it was normal.  He has never had GI bleeding previously.  He is anticoagulated with warfarin for history of stroke, atrial fibrillation, and has had cardiac valves, replaced. There are no other known modifying factors.   Past Medical History:  Diagnosis Date  . Bladder stones   . Borderline diabetes   . BPH (benign prostatic hyperplasia)   . Coronary artery disease    cardiologist-  dr Nat Math gerhart NP--- per cath 06-02-2010 non-obstructive cad pLAD 30-40%  . Diverticulosis of colon   . Dysrhythmia    afib  . Gout   . Heart murmur   . History of adenomatous polyp of colon    2002-- tubular adenoma  . History of aortic insufficiency    severe -- s/p  AVR 08-03-2010  . History of small bowel obstruction    02/ 2007 mechanical sbo s/p  surgical intervention;  partial sbo 09/ 2011 and 03-20-2011 resolved without surgical intervention  . History of urinary retention   . HTN (hypertension)   . Peripheral neuropathy   . Persistent atrial fibrillation   . S/P aortic valve replacement with prosthetic valve 08/03/2010   tissue valve  . S/P Maze operation for atrial fibrillation 01/30/2018   Complete bilateral atrial lesion set using bipolar radiofrequency and cryothermy with clipping of LA appendage  .  S/P MVR (mitral valve repair) 01/30/2018   Complex valvuloplasty including artificial Gore-tex neochord placement x4 and [employer]ics Annuloflex ring annuloplasty, size 28  . S/P patent foramen ovale closure 08/03/2010   at same time AVR  . S/P tricuspid valve repair 01/30/2018   Using an MC3 Annuloplasty ring, size 28  . Stroke ([place])   . Tricuspid regurgitation     Patient Active Problem List   Diagnosis Date Noted  . Symptomatic anemia 09/05/2018  . Anemia due to GI blood loss 09/05/2018  . GI bleed 09/05/2018  . Long term (current) use of anticoagulants 02/10/2018  . S/P mitral valve repair  01/30/2018  . S/P tricuspid valve repair 01/30/2018  . S/P Maze operation for atrial fibrillation 01/30/2018  . Tricuspid regurgitation   . Severe mitral regurgitation 10/14/2017  . Trochanteric bursitis of right hip 11/27/2016  . History of ischemic left MCA stroke 11/27/2016  . Persistent atrial fibrillation   . Hyperglycemia   . Right middle cerebral artery stroke ([place]) 10/29/2016  . Acute embolic stroke ([place])   . Hypokalemia   . Left hemiparesis ([place])   . Dysphagia, post-stroke   . S/P AVR (aortic valve replacement)   . Benign essential HTN   . Diabetes mellitus type 2 in nonobese (HCC)   . Benign prostatic hyperplasia   . A-fib ([place]) 10/28/2016  . CVA (cerebral vascular  accident) ([place]) 10/24/2016  . Essential hypertension   . Gout   . History of small bowel obstruction   . History of BPH   . S/P AVR 08/03/2010    Past Surgical History:  Procedure Laterality Date  . CARDIAC CATHETERIZATION  06-02-2010  dr Marlou Porch   non-obstructive cad- pLAD 30-40%/  normal LVSF/  severe AI  . CARDIOVASCULAR STRESS TEST  04/12/2016   Low risk nuclear perfusion study w/ no significant reversible ischemia/  normal LV function and wall motion ,  stress ef 60%/  48mm inferior and lateral scooped ST-segment depression w/ exercise (may be repolarization abnormality), exercise capacity was moderately  reduced  . CATARACT EXTRACTION W/ INTRAOCULAR LENS  IMPLANT, BILATERAL  02/2010  . CLIPPING OF ATRIAL APPENDAGE N/A 01/30/2018   Procedure: CLIPPING OF LEFT ATRIAL APPENDAGE USING ATRICLIP PRO2 45MM;  Surgeon: Rexene Alberts, MD;  Location: [facility];  Service: Open Heart Surgery;  Laterality: N/A;  . CYSTOSCOPY WITH LITHOLAPAXY N/A 06/05/2016   Procedure: CYSTOSCOPY WITH LITHOLAPAXY and fulgarization of bladder neck;  Surgeon: Irine Seal, MD;  Location: [facility];  Service: Urology;  Laterality: N/A;  . EXPLORATORY LAPARTOMY /  CHOLECYSTECTOMY  02/28/2005   for Small  bowel obstruction (mechnical)  . IR ANGIO EXTRACRAN SEL COM CAROTID INNOMINATE UNI L MOD SED  10/24/2016  . IR ANGIO VERTEBRAL SEL SUBCLAVIAN INNOMINATE BILAT MOD SED  10/24/2016  . IR PERCUTANEOUS ART THROMBECTOMY/INFUSION INTRACRANIAL INC DIAG ANGIO  10/24/2016  . IR RADIOLOGIST EVAL & MGMT  12/05/2016  . LEFT KNEE ARTHROSCOPY  2006  . MAZE N/A 01/30/2018   Procedure: MAZE;  Surgeon: Rexene Alberts, MD;  Location: [facility];  Service: Open Heart Surgery;  Laterality: N/A;  . MITRAL VALVE REPAIR N/A 01/30/2018   Procedure: MITRAL VALVE REPAIR (MVR) USING CARBOMEDICS ANNULOFLEX SIZE 28;  Surgeon: Rexene Alberts, MD;  Location: [facility];  Service: Open Heart Surgery;  Laterality: N/A;  . RADIOLOGY WITH ANESTHESIA N/A 10/24/2016   Procedure: RADIOLOGY WITH ANESTHESIA;  Surgeon: Luanne Bras, MD;  Location: [facility];  Service: Radiology;  Laterality: N/A;  . RIGHT FOOT SURGERY    . RIGHT MINIATURE ANTERIOR THORACOTOMY FOR AORTIC VALVE REPLACEMENT AND CLOSURE PATENT FORAMEN OVALE  08-03-2010  DR Levada Schilling Magna-ease pericardial tissue valve (59mm)  . RIGHT/LEFT HEART CATH AND CORONARY ANGIOGRAPHY N/A 10/14/2017   Procedure: RIGHT/LEFT HEART CATH AND CORONARY ANGIOGRAPHY;  Surgeon: [name], MD;  Location: [facility] CV LAB;  Service: Cardiovascular;  Laterality: N/A;  . TEE WITHOUT CARDIOVERSION N/A  10/14/2017   Procedure: TRANSESOPHAGEAL ECHOCARDIOGRAM (TEE);  Surgeon: Josue Hector, MD;  Location: [facility] ENDOSCOPY;  Service: Cardiovascular;  Laterality: N/A;  . TRANSTHORACIC ECHOCARDIOGRAM  05/30/2016  dr skains   moderate  LVH ef 60-65%/  bioprothesis aortic valve present ,normal grandient and no AI /  mild MV calcification , moderate MR /  mild PR/ moderate TR/  PASP 18mmHg/ (RA denisty was identified 04-27-2016 echo) and is seen again today, this is likely a promient eustacian ridge, atrium is normal size  . TRICUSPID VALVE REPLACEMENT N/A 01/30/2018   Procedure: TRICUSPID VALVE REPAIR USING MC3 SIZE 28;  Surgeon: Rexene Alberts, MD;  Location: [facility];  Service: Open Heart Surgery;  Laterality: N/A;        Home Medications    Prior to Admission medications   Medication Sig Start Date End Date Taking? Authorizing Provider  allopurinol (ZYLOPRIM) 300 MG tablet Take 150 mg  by mouth daily.   Yes [provider]  amoxicillin (AMOXIL) 500 MG capsule Take 2,000 mg by mouth See admin instructions. Take 2000 mg by mouth 1 hour prior to dental appointment   Yes [provider]  aspirin EC 81 MG EC tablet Take 1 tablet (81 mg total) by mouth daily. 02/05/18  Yes Lars Pinks M, PA-C  atenolol (TENORMIN) 25 MG tablet Take 0.5 tablets (12.5 mg total) by mouth 2 (two) times daily. Patient taking differently: Take 12.5 mg by mouth daily.  03/24/18 09/05/18 Yes B[place]is Junes, NP  b complex vitamins tablet Take 1 tablet by mouth daily.   Yes [provider]  diphenhydrAMINE (BENADRYL) 2 % cream Apply 1 application topically daily as needed for itching.   Yes [provider]  finasteride (PROSCAR) 5 MG tablet Take 1 tablet (5 mg total) by mouth daily. 11/05/16  Yes Angiulli, Lavon Paganini, PA-C  lisinopril (ZESTRIL) 5 MG tablet Take 5 mg by mouth daily.   Yes [provider]  tamsulosin (FLOMAX) 0.4 MG CAPS capsule Take 2 capsules (0.8 mg total) by mouth  daily after breakfast. Patient taking differently: Take 0.8 mg by mouth daily after supper.  11/05/16  Yes Angiulli, Lavon Paganini, PA-C  traZODone (DESYREL) 50 MG tablet Take 50 mg by mouth at bedtime as needed for sleep.  01/20/18  Yes [provider]  warfarin (COUMADIN) 5 MG tablet Take 1 tablet daily except 1.5 tablets on Monday and Friday or as directed By Anticoagulation Clinic. 07/22/18  Yes Jerline Pain, MD    Family History Family History  Problem Relation Age of Onset  . Heart disease Mother   . Brain cancer Father     Social History Social History   Tobacco Use  . Smoking status: Never Smoker  . Smokeless tobacco: Never Used  Substance Use Topics  . Alcohol use: Yes    Comment: ONE OR TWO PER MONTH  . Drug use: No     Allergies   Sulfa antibiotics   Review of Systems Review of Systems  All other systems reviewed and are negative.    Physical Exam Updated Vital Signs BP (!) 160/85   Pulse (!) 59   Temp 98 F (36.7 C) (Oral)   Resp 15   Ht 6' (1.829 m)   Wt 74.8 kg   SpO2 98%   BMI 22.38 kg/m   Physical Exam Vitals signs and nursing note reviewed.  Constitutional:      General: He is not in acute distress.    Appearance: Normal appearance. He is well-developed. He is not ill-appearing, toxic-appearing or diaphoretic.  HENT:     Head: Normocephalic and atraumatic.     Right Ear: External ear normal.     Left Ear: External ear normal.  Eyes:     Conjunctiva/sclera: Conjunctivae normal.     Pupils: Pupils are equal, round, and reactive to light.  Neck:     Musculoskeletal: Normal range of motion and neck supple.     Trachea: Phonation normal.  Cardiovascular:     Rate and Rhythm: Normal rate and regular rhythm.     Heart sounds: Normal heart sounds.  Pulmonary:     Effort: Pulmonary effort is normal.     Breath sounds: Normal breath sounds.  Abdominal:     Palpations: Abdomen is soft.     Tenderness: There is no abdominal tenderness.   Musculoskeletal: Normal range of motion.  Skin:    General: Skin  is warm and dry.     Coloration: Skin is not jaundiced or pale.     Findings: No erythema.  Neurological:     Mental Status: He is alert and oriented to person, place, and time.     Cranial Nerves: No cranial nerve deficit.     Sensory: No sensory deficit.     Motor: No abnormal muscle tone.     Coordination: Coordination normal.  Psychiatric:        Mood and Affect: Mood normal.        Behavior: Behavior normal.        Thought Content: Thought content normal.        Judgment: Judgment normal.      ED Treatments / Results  Labs (all labs ordered are listed, but only abnormal results are displayed) Labs Reviewed  COMPREHENSIVE METABOLIC PANEL - Abnormal; Notable for the following components:      Result Value   Glucose, Bld 116 (*)    All other components within normal limits  CBC - Abnormal; Notable for the following components:   RBC 2.69 (*)    Hemoglobin 8.9 (*)    HCT 28.0 (*)    MCV 104.1 (*)    All other components within normal limits  PROTIME-INR - Abnormal; Notable for the following components:   Prothrombin Time 25.1 (*)    INR 2.3 (*)    All other components within normal limits  IRON AND TIBC - Abnormal; Notable for the following components:   Iron 43 (*)    Saturation Ratios 12 (*)    All other components within normal limits  FERRITIN - Abnormal; Notable for the following components:   Ferritin 19 (*)    All other components within normal limits  RETICULOCYTES - Abnormal; Notable for the following components:   Retic Ct Pct 3.9 (*)    RBC. 2.73 (*)    Immature Retic Fract 28.6 (*)    All other components within normal limits  POC OCCULT BLOOD, ED - Abnormal; Notable for the following components:   Fecal Occult Bld POSITIVE (*)    All other components within normal limits  SARS CORONAVIRUS 2 (HOSPITAL ORDER, [facility])  VITAMIN B12  FOLATE  PROTIME-INR   TYPE AND SCREEN  ABO/RH    EKG EKG Interpretation  Date/Time:  Friday September 05 2018 13:51:06 EDT Ventricular Rate:  67 PR Interval:    QRS Duration: 82 QT Interval:  450 QTC Calculation: 476 R Axis:   81 Text Interpretation:  Artifact Atrial fibrillation vs  Atrial flutter Anterior infarct, old Minimal ST depression, lateral leads Confirmed by Daleen Bo [phone]) on 09/05/2018 5:31:19 PM  CHA2DS2/VAS Stroke Risk Points  Current as of 11 minutes ago     7 >= 2 Points: High Risk  1 - 1.99 Points: Medium Risk  0 Points: Low Risk    The previous score was 6 on 10/23/2017.: Last Change:     Details    This score determines the patient's risk of having a stroke if the  patient has atrial fibrillation.       Points Metrics  1 Has Congestive Heart Failure:  Yes    Current as of 11 minutes ago  0 Has Vascular Disease:  No    Current as of 11 minutes ago  1 Has Hypertension:  Yes    Current as of 11 minutes ago  2 Age:  35    Current as of  11 minutes ago  1 Has Diabetes:  Yes    Current as of 11 minutes ago  2 Had Stroke:  Yes  Had TIA:  No  Had thromboembolism:  No    Current as of 11 minutes ago  0 Male:  No    Current as of 11 minutes ago           Radiology No results found.  Procedures .Critical Care Performed by: Daleen Bo, MD Authorized by: Daleen Bo, MD   Critical care provider statement:    Critical care time (minutes):  45   Critical care start time:  09/05/2018 5:10 PM   Critical care end time:  09/05/2018 9:18 PM   Critical care was necessary to treat or prevent imminent or life-threatening deterioration of the following conditions:  Circulatory failure   Critical care was time spent personally by me on the following activities:  Discussions with consultants, evaluation of patient's response to treatment, examination of patient, ordering and performing treatments and interventions, ordering and review of laboratory studies, ordering and  review of radiographic studies, pulse oximetry, re-evaluation of patient's condition, obtaining history from patient or surrogate and review of old charts   (including critical care time)  Medications Ordered in ED Medications  pantoprazole (PROTONIX) injection 40 mg (has no administration in time range)     Initial Impression / Assessment and Plan / ED Course  I have reviewed the triage vital signs and the nursing notes.  Pertinent labs & imaging results that were available during my care of the patient were reviewed by me and considered in my medical decision making (see chart for details).  Clinical Course as of Sep 05 2119  Fri Sep 05, 2018  1904 Clarification of MR valve replacement: 01/30/2018 where redo sternotomy was accomplished followed by mitral valve annuloplasty utilizing a 28 mm Sorin Carbo[employer]ics annular flex annuloplasty ring and neo-cord placement x4.  In addition, a complete biatrial maze procedure was performed along with left atrial appendage clipping and tricuspid valve annuloplasty.   [EW]  2035 I was able to reach the on-call cardiology fellow, Levada Dy, who states that the patient is really only anticoagulated for atrial fibrillation.  Therefore in the face of GI bleeding, he can be managed off anticoagulants, until he has a procedure.   [EW]    Clinical Course User Index [EW] Daleen Bo, MD        Patient Vitals for the past 24 hrs:  BP Temp Temp src Pulse Resp SpO2 Height Weight  09/05/18 2120 - 98 F (36.7 C) Oral - - - - -  09/05/18 2030 (!) 160/85 - - (!) 59 15 98 % - -  09/05/18 2007 - - - - - - 6' (1.829 m) 74.8 kg  09/05/18 2000 (!) 151/68 - - (!) 58 16 98 % - -  09/05/18 1930 (!) 154/83 - - (!) 54 15 98 % - -  09/05/18 1900 (!) 159/71 - - (!) 56 16 100 % - -  09/05/18 1800 (!) 170/80 - - 60 17 100 % - -  09/05/18 1700 (!) 172/78 - - (!) 56 18 100 % - -  09/05/18 1632 (!) 160/94 - - 66 16 100 % - -  09/05/18 1348 (!) 183/97 98 F (36.7 C)  Oral 68 18 100 % - -    6:10 PM Reevaluation with update and discussion. After initial assessment and treatment, an updated evaluation reveals he is comfortable has no change in clinical  status.  He is updated on findings and potential plans.  Call has been placed to GI. Daleen Bo   Medical Decision Making: Patient with rectal bleeding, hemoglobin dropped to 9, and is currently anticoagulated, appropriately for atrial fibrillation and cardiac valvular disease.  He is a risk for decompensation if he is discharged.  Therefore he needs to be admitted, started on heparin, undergo colon prep and subsequent colonoscopy.  Currently hemoglobin is 9, and hemodynamically he is stable, therefore does not need blood transfusion, at this time.  He reports a history of normal colonoscopy 2 years ago.  CRITICAL CARE-yes Performed by: Daleen Bo   Nursing Notes Reviewed/ Care Coordinated Applicable Imaging Reviewed Interpretation of Laboratory Data incorporated into ED treatment  Discussed the case with Dr. Fabio Pierce gastroenterology.  He will see the patient tomorrow and agrees with the plan.  6:34 PM-Consult complete with hospitalist. Patient case explained and discussed.  She agrees to admit patient for further evaluation and treatment.  With several phone calls to discuss the case and she has had a, cardiology which I have done, and they feel he can be managed off anticoagulants.  Calls ended at 835 pM  Plan: Admit   Final Clinical Impressions(s) / ED Diagnoses   Final diagnoses:  Rectal bleeding  Iron deficiency anemia due to chronic blood loss  Longstanding persistent atrial fibrillation    ED Discharge Orders    None       Daleen Bo, MD 09/05/18 2121

## 2018-09-05 NOTE — ED Notes (Signed)
Pt's wife asked about pt's wait. Informed wife that patient is waiting in triage room at this time until a treatment room is available. Wife, [name], [phone], stated that she is leaving and will come back when he is in treatment room,

## 2018-09-05 NOTE — H&P (Addendum)
[name]. [id number] DOB: Jul 09, 1935 DOA: 09/05/2018     PCP: Leighton Ruff, MD   Outpatient Specialists:   CARDS:   Dr. Servando Snare & Skains/[facility]   GI Dr.  Sadie Haber, [facility])   CT surgery DR. Ricard Dillon Patient arrived to ER on 09/05/18 at 1341  Patient coming from: home Lives   With family    Chief Complaint:   Chief Complaint  Patient presents with  . Dizziness  . sent from [facility] for low Hgb  . Abnormal Lab    HPI: [name]. is a 83 y.o. male with medical history significant of diverticulosis/diverticulitis adenomatous polypaortic valve replacement using abioprosthetictissue valve in 2012,hypertension, atrial fibrillation on long-term anticoagulation, and previous embolic stroke in [id number] status post PFO repair. Diet-controlled diabetes, hypertension, gout BPH  Presented with   feeling lightheaded and presyncope for the past week or so he went to his primary care provider who obtained blood work showed that his hemoglobin went down from 12.3-9.4 he was suspecting that patient may have some sort of internal bleeding.  Patient initially denied melena no vomiting no hematemesis. Patient is on chronic anticoagulation secondary to atrial fibrillation and bioprosthetic aortic valve as well as recent mitral valve repair and maze procedure.   Denies any associated chest pain no fevers no chills no nausea no vomiting no diarrhea no back pain Last colonoscopy was 2 years ago No prior history of GI bleed. Denies abdominal pain no epigastric pain  Patient himself has not seen blood in stool but stool have been a little darker But not black and sticky  No hx of syncope No chest pain a bit DOE   Infectious risk factors:  Reports severe fatigue    In  ER RAPID COVID TEST NEGATIVE    Regarding pertinent Chronic problems:       HTN on Atenalol and lisinopril    Sp Aortic valve replacement using a68mm Edwards Magna Easestented bovine pericardial tissue  valve via right minithoracotomy approach for severe aortic insufficiency. He recovered uneventfully and was followed for a period of time by Dr. Marlou Porch but subsequently lost to follow-up.      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 Sp catheter directed therapy and he recovered   In 2019 diagnosed with  mitral regurgitation with mitral valve prolapse including a flail segment involving the middle scallop of the posterior leaflet on 01/30/18 Patient underwent MV (Complex valvuloplasty including artificial Gore-tex neochord placement x4 and Carbo medics Annuloflex ring annuloplasty, size 28) and TV repair(Using an MC3 Annuloplasty ring, size 28), MAZE, and LA clip He was started on Coumadin for his MV Repair and MAZE procedure.     A. Fib -  - CHA2DS2 vas score 6 :  current  on anticoagulation  with  Coumadin   sp MAZE procedure 01/30/2018           -  Rate control:  Currently controlled with atenalol        BPH on Flomax and Proscar  While in ER: Noted to be Hemoccult positive from below with dark red stools suspecting of GI bleed GI has been consulted will see patient in the morning hemoglobin noted to be 8.9 BUN 21 INR 2.3 The following Work up has been ordered so far:  Orders Placed This Encounter  Procedures  . SARS Coronavirus 2 [facility] order, Performed in [facility] hospital lab) Nasopharyngeal Nasopharyngeal Swab  . Comprehensive metabolic panel  . CBC  . Protime-INR  . Diet  NPO time specified  . Orthostatic Vital signs  . Place X2 Large Bore IV's  . Initiate Carrier Fluid Protocol  . Consult to gastroenterology  ALL PATIENTS BEING ADMITTED/HAVING PROCEDURES NEED COVID-19 SCREENING  . Consult to hospitalist  ALL PATIENTS BEING ADMITTED/HAVING PROCEDURES NEED COVID-19 SCREENING  . heparin per pharmacy consult  . Airborne and Contact precautions  . Pulse oximetry, continuous  . POC occult blood, ED  . EKG 12-Lead  . Type and screen [place]  . ABO/Rh     Following Medications were ordered in ER: Medications - No data to display      Consult Orders  (From admission, onward)         Start     Ordered   09/05/18 1817  Consult to hospitalist  ALL PATIENTS BEING ADMITTED/HAVING PROCEDURES NEED COVID-19 SCREENING  Once    Comments: ALL PATIENTS BEING ADMITTED/HAVING PROCEDURES NEED COVID-19 SCREENING  Provider:  (Not yet assigned)  Question Answer Comment  Place call to: [facility]   Reason for Consult Admit      09/05/18 1816          ER Provider Called:  [facility]   Dr.  Alessandra Bevels,  They Recommend admit to medicine   Will see in AM    Significant initial  Findings: Abnormal Labs Reviewed  COMPREHENSIVE METABOLIC PANEL - Abnormal; Notable for the following components:      Result Value   Glucose, Bld 116 (*)    All other components within normal limits  CBC - Abnormal; Notable for the following components:   RBC 2.69 (*)    Hemoglobin 8.9 (*)    HCT 28.0 (*)    MCV 104.1 (*)    All other components within normal limits  PROTIME-INR - Abnormal; Notable for the following components:   Prothrombin Time 25.1 (*)    INR 2.3 (*)    All other components within normal limits  POC OCCULT BLOOD, ED - Abnormal; Notable for the following components:   Fecal Occult Bld POSITIVE (*)    All other components within normal limits     Otherwise labs showing:    Recent Labs  Lab 09/05/18 1455  NA 141  K 4.7  CO2 25   GLUCOSE 116*  BUN 21  CREATININE 1.04  CALCIUM 9.0    Cr   stable,   Lab Results  Component Value Date   CREATININE 1.04 09/05/2018   CREATININE 0.89 03/24/2018   CREATININE 0.93 02/25/2018    Recent Labs  Lab 09/05/18 1455  AST 24  ALT 17  ALKPHOS 57  BILITOT 0.7  PROT 7.6  ALBUMIN 4.3   Lab Results  Component Value Date   CALCIUM 9.0 09/05/2018   PHOS 1.5 (L)  10/27/2016       WBC       Component Value Date/Time   WBC 4.9 09/05/2018 1455   ANC    Component Value Date/Time   NEUTROABS 2.8 09/20/2017 1125   ALC No results found for: LYMPHOABS    Plt: Lab Results  Component Value Date   PLT 176 09/05/2018      COVID-19 Labs   No results found for: SARSCOV2NAA      HG/HCT  Down   baseline see below    Component Value Date/Time   HGB 8.9 (L) 09/05/2018 1455   HGB 11.6 (L) 03/24/2018 1113   HCT 28.0 (L) 09/05/2018 1455   HCT 35.6 (L) 03/24/2018 1113      DM  labs:  HbA1C: Recent Labs    01/20/18 1231  HGBA1C 5.9*         UA   not ordered     ECG:  Personally reviewed by me showing: HR : 67 Rhythm:   A.fib.    nonspecific changes  QTC 476     ED Triage Vitals  Enc Vitals Group     BP 09/05/18 1348 (!) 183/97     Pulse Rate 09/05/18 1348 68     Resp 09/05/18 1348 18     Temp 09/05/18 1348 98 F (36.7 C)     Temp Source 09/05/18 1348 Oral     SpO2 09/05/18 1348 100 %     Weight --      Height --      Head Circumference --      Peak Flow --      Pain Score 09/05/18 1400 0     Pain Loc --      Pain Edu? --      Excl. in [place]? --   TMAX(24)@       Latest  Blood pressure (!) 170/80, pulse 60, temperature 98 F (36.7 C), temperature source Oral, resp. rate 17, SpO2 100 %.     Hospitalist was called for admission for GI bleeding with symptomatic anemia   Review of Systems:    Pertinent positives include:  fatigue, lightheaded  Constitutional:  No weight loss, night sweats, Fevers, chills,weight loss  HEENT:  No  headaches, Difficulty swallowing,Tooth/dental problems,Sore throat,  No sneezing, itching, ear ache, nasal congestion, post nasal drip,  Cardio-vascular:  No chest pain, Orthopnea, PND, anasarca, dizziness, palpitations.no Bilateral lower extremity swelling  GI:  No heartburn, indigestion, abdominal pain, nausea, vomiting, diarrhea, change in bowel habits, loss of appetite, melena, blood in stool, hematemesis Resp:  no shortness of breath at rest. No dyspnea on exertion, No excess mucus, no productive cough, No non-productive cough, No coughing up of blood.No change in color of mucus.No wheezing. Skin:  no rash or lesions. No jaundice GU:  no dysuria, change in color of urine, no urgency or frequency. No straining to urinate.  No flank pain.  Musculoskeletal:  No joint pain or no joint swelling. No decreased range of motion. No back pain.  Psych:  No change in mood or affect. No depression or anxiety. No memory loss.  Neuro: no localizing neurological complaints, no tingling, no weakness, no double vision, no gait abnormality, no slurred speech, no confusion  All systems reviewed and apart from [place] all are negative  Past Medical History:   Past Medical History:  Diagnosis Date  . Bladder stones   . Borderline diabetes   . BPH (benign prostatic hyperplasia)   . Coronary artery  disease    cardiologist-  dr Nat Math gerhart NP--- per cath 06-02-2010 non-obstructive cad pLAD 30-40%  . Diverticulosis of colon   . Dysrhythmia    afib  . Gout   . Heart murmur   . History of adenomatous polyp of colon    2002-- tubular adenoma  . History of aortic insufficiency    severe -- s/p  AVR 08-03-2010  . History of small bowel obstruction    02/ 2007 mechanical sbo s/p  surgical intervention;  partial sbo 09/ 2011 and 03-20-2011 resolved without surgical intervention  . History of urinary retention   . HTN (hypertension)   . Peripheral neuropathy   . Persistent atrial fibrillation    . S/P aortic valve replacement with prosthetic valve 08/03/2010   tissue valve  . S/P Maze operation for atrial fibrillation 01/30/2018   Complete bilateral atrial lesion set using bipolar radiofrequency and cryothermy with clipping of LA appendage  . S/P MVR (mitral valve repair) 01/30/2018   Complex valvuloplasty including artificial Gore-tex neochord placement x4 and Carbo medics Annuloflex ring annuloplasty, size 28  . S/P patent foramen ovale closure 08/03/2010   at same time AVR  . S/P tricuspid valve repair 01/30/2018   Using an MC3 Annuloplasty ring, size 28  . Stroke ([place])   . Tricuspid regurgitation       Past Surgical History:  Procedure Laterality Date  . CARDIAC CATHETERIZATION  06-02-2010  dr Marlou Porch   non-obstructive cad- pLAD 30-40%/  normal LVSF/  severe AI  . CARDIOVASCULAR STRESS TEST  04/12/2016   Low risk nuclear perfusion study w/ no significant reversible ischemia/  normal LV function and wall motion ,  stress ef 60%/  64mm inferior and lateral scooped ST-segment depression w/ exercise (may be repolarization abnormality), exercise capacity was moderately reduced  . CATARACT EXTRACTION W/ INTRAOCULAR LENS  IMPLANT, BILATERAL  02/2010  . CLIPPING OF ATRIAL APPENDAGE N/A 01/30/2018   Procedure: CLIPPING OF LEFT ATRIAL APPENDAGE USING ATRICLIP PRO2 45MM;  Surgeon: Rexene Alberts, MD;  Location: [place];  Service: Open Heart Surgery;  Laterality: N/A;  . CYSTOSCOPY WITH LITHOLAPAXY N/A 06/05/2016   Procedure: CYSTOSCOPY WITH LITHOLAPAXY and fulgarization of bladder neck;  Surgeon: Irine Seal, MD;  Location: [facility];  Service: Urology;  Laterality: N/A;  . EXPLORATORY LAPARTOMY /  CHOLECYSTECTOMY  02/28/2005   for Small  bowel obstruction (mechnical)  . IR ANGIO EXTRACRAN SEL COM CAROTID INNOMINATE UNI L MOD SED  10/24/2016  . IR ANGIO VERTEBRAL SEL SUBCLAVIAN INNOMINATE BILAT MOD SED  10/24/2016  . IR PERCUTANEOUS ART THROMBECTOMY/INFUSION INTRACRANIAL  INC DIAG ANGIO  10/24/2016  . IR RADIOLOGIST EVAL & MGMT  12/05/2016  . LEFT KNEE ARTHROSCOPY  2006  . MAZE N/A 01/30/2018   Procedure: MAZE;  Surgeon: Rexene Alberts, MD;  Location: [place];  Service: Open Heart Surgery;  Laterality: N/A;  . MITRAL VALVE REPAIR N/A 01/30/2018   Procedure: MITRAL VALVE REPAIR (MVR) USING CARBOMEDICS ANNULOFLEX SIZE 28;  Surgeon: Rexene Alberts, MD;  Location: [place];  Service: Open Heart Surgery;  Laterality: N/A;  . RADIOLOGY WITH ANESTHESIA N/A 10/24/2016   Procedure: RADIOLOGY WITH ANESTHESIA;  Surgeon: Luanne Bras, MD;  Location: [place];  Service: Radiology;  Laterality: N/A;  . RIGHT FOOT SURGERY    . RIGHT MINIATURE ANTERIOR THORACOTOMY FOR AORTIC VALVE REPLACEMENT AND CLOSURE PATENT FORAMEN OVALE  08-03-2010  DR Levada Schilling Magna-ease pericardial tissue valve (8mm)  .  RIGHT/LEFT HEART CATH AND CORONARY ANGIOGRAPHY N/A 10/14/2017   Procedure: RIGHT/LEFT HEART CATH AND CORONARY ANGIOGRAPHY;  Surgeon: [name], MD;  Location: [place] CV LAB;  Service: Cardiovascular;  Laterality: N/A;  . TEE WITHOUT CARDIOVERSION N/A 10/14/2017   Procedure: TRANSESOPHAGEAL ECHOCARDIOGRAM (TEE);  Surgeon: Josue Hector, MD;  Location: [facility] ENDOSCOPY;  Service: Cardiovascular;  Laterality: N/A;  . TRANSTHORACIC ECHOCARDIOGRAM  05/30/2016  dr skains   moderate  LVH ef 60-65%/  bioprothesis aortic valve present ,normal grandient and no AI /  mild MV calcification , moderate MR /  mild PR/ moderate TR/  PASP 58mmHg/ (RA denisty was identified 04-27-2016 echo) and is seen again today, this is likely a promient eustacian ridge, atrium is normal size  . TRICUSPID VALVE REPLACEMENT N/A 01/30/2018   Procedure: TRICUSPID VALVE REPAIR USING MC3 SIZE 28;  Surgeon: Rexene Alberts, MD;  Location: [place];  Service: Open Heart Surgery;  Laterality: N/A;    Social History:  Ambulatory   Independently      reports that he has never smoked. He has never used smokeless  tobacco. He reports current alcohol use. He reports that he does not use drugs.     Family History:   Family History  Problem Relation Age of Onset  . Heart disease Mother   . Brain cancer Father     Allergies: Allergies  Allergen Reactions  . Sulfa Antibiotics Other (See Comments)    Granulocytosis     Prior to Admission medications   Medication Sig Start Date End Date Taking? Authorizing Provider  allopurinol (ZYLOPRIM) 300 MG tablet Take 150 mg by mouth daily.   Yes [provider]  amoxicillin (AMOXIL) 500 MG capsule Take 2,000 mg by mouth See admin instructions. Take 2000 mg by mouth 1 hour prior to dental appointment   Yes [provider]  aspirin EC 81 MG EC tablet Take 1 tablet (81 mg total) by mouth daily. 02/05/18  Yes Lars Pinks M, PA-C  atenolol (TENORMIN) 25 MG tablet Take 0.5 tablets (12.5 mg total) by mouth 2 (two) times daily. Patient taking differently: Take 12.5 mg by mouth daily.  03/24/18 09/05/18 Yes Burtis Junes, NP  b complex vitamins tablet Take 1 tablet by mouth daily.   Yes [provider]  diphenhydrAMINE (BENADRYL) 2 % cream Apply 1 application topically daily as needed for itching.   Yes [provider]  finasteride (PROSCAR) 5 MG tablet Take 1 tablet (5 mg total) by mouth daily. 11/05/16  Yes Angiulli, Lavon Paganini, PA-C  lisinopril (ZESTRIL) 5 MG tablet Take 5 mg by mouth daily.   Yes [provider]  tamsulosin (FLOMAX) 0.4 MG CAPS capsule Take 2 capsules (0.8 mg total) by mouth daily after breakfast. Patient taking differently: Take 0.8 mg by mouth daily after supper.  11/05/16  Yes Angiulli, Lavon Paganini, PA-C  traZODone (DESYREL) 50 MG tablet Take 50 mg by mouth at bedtime as needed for sleep.  01/20/18  Yes [provider]  warfarin (COUMADIN) 5 MG tablet Take 1 tablet daily except 1.5 tablets on Monday and Friday or as directed By Anticoagulation Clinic. 07/22/18  Yes Jerline Pain, MD    Physical Exam: Blood pressure (!) 170/80, pulse 60, temperature 98 F (36.7 C), temperature source Oral, resp. rate 17, SpO2 100 %. 1. General:  in No Acute distress   Chronically ill -appearing 2. Psychological: Alert and  Oriented 3. Head/ENT:     Dry Mucous Membranes  Head Non traumatic, neck supple                          Poor Dentition 4. SKIN:   decreased Skin turgor,  Skin clean Dry and intact no rash 5. Heart: Regular rate and rhythm  Systolic Murmur, no Rub or gallop 6. Lungs:  Clear to auscultation bilaterally, no wheezes or crackles   7. Abdomen: Soft, non-tender, Non distended  bowel sounds present 8. Lower extremities: no clubbing, cyanosis, no  edema 9. Neurologically Grossly intact, moving all 4 extremities equally  10. MSK: Normal range of motion   All other LABS:     Recent Labs  Lab 09/05/18 1455  WBC 4.9  HGB 8.9*  HCT 28.0*  MCV 104.1*  PLT 176     Recent Labs  Lab 09/05/18 1455  NA 141  K 4.7  CL 108  CO2 25  GLUCOSE 116*  BUN 21  CREATININE 1.04  CALCIUM 9.0     Recent Labs  Lab 09/05/18 1455  AST 24  ALT 17  ALKPHOS 57  BILITOT 0.7  PROT 7.6  ALBUMIN 4.3       Cultures: No results found for: SDES, SPECREQUEST, CULT, REPTSTATUS   Radiological Exams on Admission: No results found.  Chart has been reviewed    Assessment/Plan  84 y.o. male with medical history significant of diverticulosis/diverticulitis adenomatous polypaortic valve replacement using abioprosthetictissue valve in 2012,hypertension, atrial fibrillation on long-term anticoagulation, and previous embolic stroke in [id number] status post PFO repair. Diet-controlled diabetes, hypertension, gout BPH  Admitted for symptomatic anemia likely secondary to GI blood loss  Present on Admission: . Anemia due to GI blood loss -at this point no indication for blood transfusion we will continue serial CBC obtain type and screen and anemia  panel  Suspecting GI bleed most likely lower GI bleeding Given no significant elevation in BUN - Suspect Lower Gi source No hx of PUD, melena, to  suggest otherwise  - Admit  For further management given:   Age >60 years, comorbid illnesses  ,  exposure to    anticoagulants. -  most likely Diverticular source -  painless bleeding making colitis less likely     -  Admit to stepdown given above    -  ER  Provider spoke to gastroenterology ( [place], [facility]) they will see patient in a.m. appreciate their consult   - serial CBC.    - Monitor for any recurrence,  evidence of hemodynamic instability or significant blood loss -  type and screen,  - Transfuse as needed for hemoglobin below 7 or <9 if evidence of significant  bleeding  - Establish at least 2 PIV and fluid resuscitate   - clear liquids for tonight keep nothing by mouth post midnight,  -  monitor for Recurrent significant  Bleeding of red blood and hemodynamic instability in which case Bleeding scan and IR consult would be indicated.   . Essential hypertension -continue atenolol given significant hypertension  . History of BPH -stable continue home medications  . Persistent atrial fibrillation          - CHA2DS2 vas score 6 : hold current anticoagulation due to bleeding need to discuss with cardiology given extensive cardiac history to see if he needs bridging heparin  Cardiology no mechanical valve replacement only repair -no indication for continuation of anticoagulation at this time.        -  Rate control:  Currently  controlled with atenolol will continue          . Gout - chronic stable  . Symptomatic anemia -need to monitor CBC and trend, transfuse as needed  History of CVA no residual deficits hold aspirin  History of bioprosthetic aortic valve replacement and mechanical valve repair -  Hold Coumadin no indication for heparin bridging as per cardiology  DM2-  - Order Sensitive  SSI   -  check TSH and HgA1C      Other plan as per orders.  DVT prophylaxis:  SCD coumadin on hold    Code Status:  FULL CODE  as per patient   I had personally discussed CODE STATUS with patient   Family Communication:   Family at  Bedside  plan of care was discussed   With  Wife,  Disposition Plan:       To home once workup is complete and patient is stable                      Consults called:   Howie Ill will see in AM, ER discussed case with cardiology   Admission status:  ED Disposition    None        Obs    Level of care       SDU tele indefinitely please discontinue once patient no longer qualifies  Precautions:  NONE  No active isolations  PPE: Used by the provider:   P100  eye Goggles,  Gloves      Phelan Schadt 09/05/2018, 8:42 PM    [facility]     after 2 AM please page floor coverage PA If 7AM-7PM, please contact the day team taking care of the patient using [email or web address]

## 2018-09-05 NOTE — ED Notes (Signed)
Called x 2 for Cardiology Consult.

## 2018-09-05 NOTE — ED Triage Notes (Signed)
Pt c/o dizziness for about week as to why went to see PCP. Reports that blood work done and was called to come to ED due to HGB being lower than should. PCP suspected internal bleeding somewhere since the change form 12.3 tp 9.4hgb. denies dark or bloody stools, denies vomiting. Pt takes Warfarin

## 2018-09-05 NOTE — ED Notes (Signed)
Admitting provider at bedside.

## 2018-09-05 NOTE — ED Notes (Signed)
ED TO INPATIENT HANDOFF REPORT  ED Nurse Name and Phone #: [place] G, [phone]  S Name/Age/Gender [name]. 83 y.o. male Room/Bed: WA04/WA04  Code Status   Code Status: Prior  Home/SNF/Other Home Patient oriented to: self, place, time and situation Is this baseline? Yes   Triage Complete: Triage complete  Chief Complaint abnormal labs   Triage Note Pt c/o dizziness for about week as to why went to see PCP. Reports that blood work done and was called to come to ED due to HGB being lower than should. PCP suspected internal bleeding somewhere since the change form 12.3 tp 9.4hgb. denies dark or bloody stools, denies vomiting. Pt takes Warfarin   Allergies Allergies  Allergen Reactions  . Sulfa Antibiotics Other (See Comments)    Granulocytosis    Level of Care/Admitting Diagnosis ED Disposition    ED Disposition Condition Comment   Admit  Hospital Area: [facility] [[id number]]  Level of Care: Stepdown [14]  Admit to SDU based on following criteria: Hemodynamic compromise or significant risk of instability:  Patient requiring short term acute titration and management of vasoactive drips, and invasive monitoring (i.e., CVP and Arterial line).  Covid Evaluation: Asymptomatic Screening Protocol (No Symptoms)  Diagnosis: GI bleed [id number]  Admitting Physician: Toy Baker [[id number]]  Attending Physician: Toy Baker [[id number]]  PT Class (Do Not Modify): Observation [104]  PT Acc Code (Do Not Modify): Observation [10022]       B Medical/Surgery History Past Medical History:  Diagnosis Date  . Bladder stones   . Borderline diabetes   . BPH (benign prostatic hyperplasia)   . Coronary artery disease    cardiologist-  dr Nat Math gerhart NP--- per cath 06-02-2010 non-obstructive cad pLAD 30-40%  . Diverticulosis of colon   . Dysrhythmia    afib  . Gout   . Heart murmur   . History of adenomatous polyp of colon    2002--  tubular adenoma  . History of aortic insufficiency    severe -- s/p  AVR 08-03-2010  . History of small bowel obstruction    02/ 2007 mechanical sbo s/p  surgical intervention;  partial sbo 09/ 2011 and 03-20-2011 resolved without surgical intervention  . History of urinary retention   . HTN (hypertension)   . Peripheral neuropathy   . Persistent atrial fibrillation   . S/P aortic valve replacement with prosthetic valve 08/03/2010   tissue valve  . S/P Maze operation for atrial fibrillation 01/30/2018   Complete bilateral atrial lesion set using bipolar radiofrequency and cryothermy with clipping of LA appendage  . S/P MVR (mitral valve repair) 01/30/2018   Complex valvuloplasty including artificial Gore-tex neochord placement x4 and Carbo medics Annuloflex ring annuloplasty, size 28  . S/P patent foramen ovale closure 08/03/2010   at same time AVR  . S/P tricuspid valve repair 01/30/2018   Using an MC3 Annuloplasty ring, size 28  . Stroke ([place])   . Tricuspid regurgitation    Past Surgical History:  Procedure Laterality Date  . CARDIAC CATHETERIZATION  06-02-2010  dr Marlou Porch   non-obstructive cad- pLAD 30-40%/  normal LVSF/  severe AI  . CARDIOVASCULAR STRESS TEST  04/12/2016   Low risk nuclear perfusion study w/ no significant reversible ischemia/  normal LV function and wall motion ,  stress ef 60%/  34mm inferior and lateral scooped ST-segment depression w/ exercise (may be repolarization abnormality), exercise capacity was moderately reduced  . CATARACT EXTRACTION W/ INTRAOCULAR LENS  IMPLANT, BILATERAL  02/2010  . CLIPPING OF ATRIAL APPENDAGE N/A 01/30/2018   Procedure: CLIPPING OF LEFT ATRIAL APPENDAGE USING ATRICLIP PRO2 45MM;  Surgeon: Rexene Alberts, MD;  Location: [facility];  Service: Open Heart Surgery;  Laterality: N/A;  . CYSTOSCOPY WITH LITHOLAPAXY N/A 06/05/2016   Procedure: CYSTOSCOPY WITH LITHOLAPAXY and fulgarization of bladder neck;  Surgeon: Irine Seal, MD;  Location:  [facility];  Service: Urology;  Laterality: N/A;  . EXPLORATORY LAPARTOMY /  CHOLECYSTECTOMY  02/28/2005   for Small  bowel obstruction (mechnical)  . IR ANGIO EXTRACRAN SEL COM CAROTID INNOMINATE UNI L MOD SED  10/24/2016  . IR ANGIO VERTEBRAL SEL SUBCLAVIAN INNOMINATE BILAT MOD SED  10/24/2016  . IR PERCUTANEOUS ART THROMBECTOMY/INFUSION INTRACRANIAL INC DIAG ANGIO  10/24/2016  . IR RADIOLOGIST EVAL & MGMT  12/05/2016  . LEFT KNEE ARTHROSCOPY  2006  . MAZE N/A 01/30/2018   Procedure: MAZE;  Surgeon: Rexene Alberts, MD;  Location: [facility];  Service: Open Heart Surgery;  Laterality: N/A;  . MITRAL VALVE REPAIR N/A 01/30/2018   Procedure: MITRAL VALVE REPAIR (MVR) USING CARBOMEDICS ANNULOFLEX SIZE 28;  Surgeon: Rexene Alberts, MD;  Location: [facility];  Service: Open Heart Surgery;  Laterality: N/A;  . RADIOLOGY WITH ANESTHESIA N/A 10/24/2016   Procedure: RADIOLOGY WITH ANESTHESIA;  Surgeon: Luanne Bras, MD;  Location: [facility];  Service: Radiology;  Laterality: N/A;  . RIGHT FOOT SURGERY    . RIGHT MINIATURE ANTERIOR THORACOTOMY FOR AORTIC VALVE REPLACEMENT AND CLOSURE PATENT FORAMEN OVALE  08-03-2010  DR Levada Schilling Magna-ease pericardial tissue valve (67mm)  . RIGHT/LEFT HEART CATH AND CORONARY ANGIOGRAPHY N/A 10/14/2017   Procedure: RIGHT/LEFT HEART CATH AND CORONARY ANGIOGRAPHY;  Surgeon: [name], MD;  Location: [facility];  Service: Cardiovascular;  Laterality: N/A;  . TEE WITHOUT CARDIOVERSION N/A 10/14/2017   Procedure: TRANSESOPHAGEAL ECHOCARDIOGRAM (TEE);  Surgeon: Josue Hector, MD;  Location: [facility];  Service: Cardiovascular;  Laterality: N/A;  . TRANSTHORACIC ECHOCARDIOGRAM  05/30/2016  dr skains   moderate  LVH ef 60-65%/  bioprothesis aortic valve present ,normal grandient and no AI /  mild MV calcification , moderate MR /  mild PR/ moderate TR/  PASP 54mmHg/ (RA denisty was identified 04-27-2016 echo) and is seen again today, this is  likely a promient eustacian ridge, atrium is normal size  . TRICUSPID VALVE REPLACEMENT N/A 01/30/2018   Procedure: TRICUSPID VALVE REPAIR USING MC3 SIZE 28;  Surgeon: Rexene Alberts, MD;  Location: [facility];  Service: Open Heart Surgery;  Laterality: N/A;     A IV Location/Drains/Wounds Patient Lines/Drains/Airways Status   Active Line/Drains/Airways    Name:   Placement date:   Placement time:   Site:   Days:   Peripheral IV 09/05/18 Right;Lateral;Anterior Forearm   09/05/18    1827    Forearm   less than 1   Incision (Closed) 01/30/18 Chest Other (Comment)   01/30/18    1320     218   Incision (Closed) 01/30/18 Groin Right   01/30/18    1349     218          Intake/Output Last 24 hours No intake or output data in the 24 hours ending 09/05/18 2030  Labs/Imaging Results for orders placed or performed during the hospital encounter of 09/05/18 (from the past 48 hour(s))  ABO/Rh     Status: None (Preliminary result)   Collection Time: 09/05/18  2:52 PM  Result Value Ref Range   ABO/RH(D)      O POS Performed at [facility] [address] [place], [place] [address]   Comprehensive metabolic panel     Status: Abnormal   Collection Time: 09/05/18  2:55 PM  Result Value Ref Range   Sodium 141 135 - 145 mmol/L   Potassium 4.7 3.5 - 5.1 mmol/L   Chloride 108 98 - 111 mmol/L   CO2 25 22 - 32 mmol/L   Glucose, Bld 116 (H) 70 - 99 mg/dL   BUN 21 8 - 23 mg/dL   Creatinine, Ser 1.04 0.61 - 1.24 mg/dL   Calcium 9.0 8.9 - 10.3 mg/dL   Total Protein 7.6 6.5 - 8.1 g/dL   Albumin 4.3 3.5 - 5.0 g/dL   AST 24 15 - 41 U/L   ALT 17 0 - 44 U/L   Alkaline Phosphatase 57 38 - 126 U/L   Total Bilirubin 0.7 0.3 - 1.2 mg/dL   GFR calc non Af Amer >60 >60 mL/min   GFR calc Af Amer >60 >60 mL/min   Anion gap 8 5 - 15    Comment: Performed at [facility], [place] [address], [place], [place] [address]  CBC     Status: Abnormal   Collection Time: 09/05/18  2:55  PM  Result Value Ref Range   WBC 4.9 4.0 - 10.5 K/uL   RBC 2.69 (L) 4.22 - 5.81 MIL/uL   Hemoglobin 8.9 (L) 13.0 - 17.0 g/dL   HCT 28.0 (L) 39.0 - 52.0 %   MCV 104.1 (H) 80.0 - 100.0 fL   MCH 33.1 26.0 - 34.0 pg   MCHC 31.8 30.0 - 36.0 g/dL   RDW 14.7 11.5 - 15.5 %   Platelets 176 150 - 400 K/uL   nRBC 0.0 0.0 - 0.2 %    Comment: Performed at [facility], [place] [address], [place], [place] [address]  Protime-INR     Status: Abnormal   Collection Time: 09/05/18  2:55 PM  Result Value Ref Range   Prothrombin Time 25.1 (H) 11.4 - 15.2 seconds   INR 2.3 (H) 0.8 - 1.2    Comment: (NOTE) INR goal varies based on device and disease states. Performed at [facility], [place] [address] [place], [place] [address]   Vitamin B12     Status: None   Collection Time: 09/05/18  2:55 PM  Result Value Ref Range   Vitamin B-12 552 180 - 914 pg/mL    Comment: (NOTE) This assay is not validated for testing neonatal or myeloproliferative syndrome specimens for Vitamin B12 levels. Performed at [facility], [place] [address], [place], [place] [address]   Folate     Status: None   Collection Time: 09/05/18  2:55 PM  Result Value Ref Range   Folate 24.1 >5.9 ng/mL    Comment: Performed at [facility], [place] [address] [place], [place] [address]  Iron and TIBC     Status: Abnormal   Collection Time: 09/05/18  2:55 PM  Result Value Ref Range   Iron 43 (L) 45 - 182 ug/dL   TIBC 368 250 - 450 ug/dL   Saturation Ratios 12 (L) 17.9 - 39.5 %   UIBC 325 ug/dL    Comment: Performed at [facility], [place] [address] [place], [place] [address]  Ferritin     Status: Abnormal   Collection Time: 09/05/18  2:55  PM  Result Value Ref Range   Ferritin 19 (L) 24 - 336 ng/mL    Comment: Performed at [facility], [place] [address], [place], [place] [address]  Reticulocytes     Status: Abnormal   Collection  Time: 09/05/18  2:55 PM  Result Value Ref Range   Retic Ct Pct 3.9 (H) 0.4 - 3.1 %   RBC. 2.73 (L) 4.22 - 5.81 MIL/uL   Retic Count, Absolute 107.0 19.0 - 186.0 K/uL   Immature Retic Fract 28.6 (H) 2.3 - 15.9 %    Comment: Performed at [facility], [place] [address], [place], [place] [address]  Type and screen [place]     Status: None   Collection Time: 09/05/18  2:57 PM  Result Value Ref Range   ABO/RH(D) O POS    Antibody Screen NEG    Sample Expiration      09/08/2018,2359 Performed at [facility], [place] [address], [place], [place] [address]   POC occult blood, ED     Status: Abnormal   Collection Time: 09/05/18  5:45 PM  Result Value Ref Range   Fecal Occult Bld POSITIVE (A) NEGATIVE  SARS Coronavirus 2 [facility] order, Performed in [facility] hospital lab) Nasopharyngeal Nasopharyngeal Swab     Status: None   Collection Time: 09/05/18  6:31 PM   Specimen: Nasopharyngeal Swab  Result Value Ref Range   SARS Coronavirus 2 NEGATIVE NEGATIVE    Comment: (NOTE) If result is NEGATIVE SARS-CoV-2 target nucleic acids are NOT DETECTED. The SARS-CoV-2 RNA is generally detectable in upper and lower  respiratory specimens during the acute phase of infection. The lowest  concentration of SARS-CoV-2 viral copies this assay can detect is 250  copies / mL. A negative result does not preclude SARS-CoV-2 infection  and should not be used as the sole basis for treatment or other  patient management decisions.  A negative result may occur with  improper specimen collection / handling, submission of specimen other  than nasopharyngeal swab, presence of viral mutation(s) within the  areas targeted by this assay, and inadequate number of viral copies  (<250 copies / mL). A negative result must be combined with clinical  observations, patient history, and epidemiological information. If result is POSITIVE SARS-CoV-2 target nucleic acids  are DETECTED. The SARS-CoV-2 RNA is generally detectable in upper and lower  respiratory specimens dur ing the acute phase of infection.  Positive  results are indicative of active infection with SARS-CoV-2.  Clinical  correlation with patient history and other diagnostic information is  necessary to determine patient infection status.  Positive results do  not rule out bacterial infection or co-infection with other viruses. If result is PRESUMPTIVE POSTIVE SARS-CoV-2 nucleic acids MAY BE PRESENT.   A presumptive positive result was obtained on the submitted specimen  and confirmed on repeat testing.  While 2019 novel coronavirus  (SARS-CoV-2) nucleic acids may be present in the submitted sample  additional confirmatory testing may be necessary for epidemiological  and / or clinical management purposes  to differentiate between  SARS-CoV-2 and other Sarbecovirus currently known to infect humans.  If clinically indicated additional testing with an alternate test  methodology [phone]) is advised. The SARS-CoV-2 RNA is generally  detectable in upper and lower respiratory sp ecimens during the acute  phase of infection. The expected result is Negative. Fact Sheet for Patients:  StrictlyIdeas.no Fact Sheet for Healthcare Providers: BankingDealers.co.za This test is not yet  approved or cleared by the [place] and has been authorized for detection and/or diagnosis of SARS-CoV-2 by FDA under an Emergency Use Authorization (EUA).  This EUA will remain in effect (meaning this test can be used) for the duration of the COVID-19 declaration under Section 564(b)(1) of the Act, 21 U.S.C. section 360bbb-3(b)(1), unless the authorization is terminated or revoked sooner. Performed at [facility], [place] [address], [place], [place] [address]    No results found.  Pending Labs [employer] (From admission, onward)     Start     Ordered   09/06/18 0500  Protime-INR  Daily,   R     09/05/18 1956   Signed and Held  CBC  Now then every 6 hours,   R     Signed and Held   Signed and Held  CBC  Once,   R     Signed and Held          Vitals/Pain [name] Vitals   09/05/18 1900 09/05/18 1930 09/05/18 2000 09/05/18 2007  BP: (!) 159/71 (!) 154/83 (!) 151/68   Pulse: (!) 56 (!) 54 (!) 58   Resp: 16 15 16    Temp:      TempSrc:      SpO2: 100% 98% 98%   Weight:    74.8 kg  Height:    6' (1.829 m)  PainSc:        Isolation Precautions No active isolations  Medications Medications  pantoprazole (PROTONIX) injection 40 mg (has no administration in time range)    Mobility walks Low fall risk

## 2018-09-06 DIAGNOSIS — I4811 Longstanding persistent atrial fibrillation: Secondary | ICD-10-CM | POA: Diagnosis present

## 2018-09-06 DIAGNOSIS — Z8249 Family history of ischemic heart disease and other diseases of the circulatory system: Secondary | ICD-10-CM | POA: Diagnosis not present

## 2018-09-06 DIAGNOSIS — Z7901 Long term (current) use of anticoagulants: Secondary | ICD-10-CM | POA: Diagnosis not present

## 2018-09-06 DIAGNOSIS — Z961 Presence of intraocular lens: Secondary | ICD-10-CM | POA: Diagnosis present

## 2018-09-06 DIAGNOSIS — D120 Benign neoplasm of cecum: Secondary | ICD-10-CM | POA: Diagnosis present

## 2018-09-06 DIAGNOSIS — Q211 Atrial septal defect: Secondary | ICD-10-CM | POA: Diagnosis not present

## 2018-09-06 DIAGNOSIS — E1142 Type 2 diabetes mellitus with diabetic polyneuropathy: Secondary | ICD-10-CM | POA: Diagnosis present

## 2018-09-06 DIAGNOSIS — Z79899 Other long term (current) drug therapy: Secondary | ICD-10-CM | POA: Diagnosis not present

## 2018-09-06 DIAGNOSIS — M1A9XX0 Chronic gout, unspecified, without tophus (tophi): Secondary | ICD-10-CM | POA: Diagnosis present

## 2018-09-06 DIAGNOSIS — R42 Dizziness and giddiness: Secondary | ICD-10-CM | POA: Diagnosis present

## 2018-09-06 DIAGNOSIS — Z7982 Long term (current) use of aspirin: Secondary | ICD-10-CM | POA: Diagnosis not present

## 2018-09-06 DIAGNOSIS — K2971 Gastritis, unspecified, with bleeding: Secondary | ICD-10-CM | POA: Diagnosis present

## 2018-09-06 DIAGNOSIS — Z86010 Personal history of colonic polyps: Secondary | ICD-10-CM | POA: Diagnosis not present

## 2018-09-06 DIAGNOSIS — D649 Anemia, unspecified: Secondary | ICD-10-CM | POA: Diagnosis not present

## 2018-09-06 DIAGNOSIS — Z953 Presence of xenogenic heart valve: Secondary | ICD-10-CM | POA: Diagnosis not present

## 2018-09-06 DIAGNOSIS — K5730 Diverticulosis of large intestine without perforation or abscess without bleeding: Secondary | ICD-10-CM | POA: Diagnosis present

## 2018-09-06 DIAGNOSIS — Z8673 Personal history of transient ischemic attack (TIA), and cerebral infarction without residual deficits: Secondary | ICD-10-CM | POA: Diagnosis not present

## 2018-09-06 DIAGNOSIS — D62 Acute posthemorrhagic anemia: Secondary | ICD-10-CM | POA: Diagnosis present

## 2018-09-06 DIAGNOSIS — Z20828 Contact with and (suspected) exposure to other viral communicable diseases: Secondary | ICD-10-CM | POA: Diagnosis present

## 2018-09-06 DIAGNOSIS — I2510 Atherosclerotic heart disease of native coronary artery without angina pectoris: Secondary | ICD-10-CM | POA: Diagnosis present

## 2018-09-06 DIAGNOSIS — N400 Enlarged prostate without lower urinary tract symptoms: Secondary | ICD-10-CM | POA: Diagnosis present

## 2018-09-06 DIAGNOSIS — Z9841 Cataract extraction status, right eye: Secondary | ICD-10-CM | POA: Diagnosis not present

## 2018-09-06 DIAGNOSIS — Z9842 Cataract extraction status, left eye: Secondary | ICD-10-CM | POA: Diagnosis not present

## 2018-09-06 DIAGNOSIS — B3781 Candidal esophagitis: Secondary | ICD-10-CM | POA: Diagnosis present

## 2018-09-06 DIAGNOSIS — D500 Iron deficiency anemia secondary to blood loss (chronic): Secondary | ICD-10-CM | POA: Diagnosis not present

## 2018-09-06 DIAGNOSIS — K5792 Diverticulitis of intestine, part unspecified, without perforation or abscess without bleeding: Secondary | ICD-10-CM | POA: Diagnosis present

## 2018-09-06 DIAGNOSIS — I10 Essential (primary) hypertension: Secondary | ICD-10-CM | POA: Diagnosis present

## 2018-09-06 LAB — CBC
HCT: 23.9 % — ABNORMAL LOW (ref 39.0–52.0)
HCT: 26 % — ABNORMAL LOW (ref 39.0–52.0)
HCT: 25.5 % — ABNORMAL LOW (ref 39.0–52.0)
HCT: 24.9 % — ABNORMAL LOW (ref 39.0–52.0)
Hemoglobin: 7.5 g/dL — ABNORMAL LOW (ref 13.0–17.0)
Hemoglobin: 8.4 g/dL — ABNORMAL LOW (ref 13.0–17.0)
Hemoglobin: 8.1 g/dL — ABNORMAL LOW (ref 13.0–17.0)
Hemoglobin: 8.1 g/dL — ABNORMAL LOW (ref 13.0–17.0)
MCH: 31.9 pg (ref 26.0–34.0)
MCH: 33.1 pg (ref 26.0–34.0)
MCH: 32.7 pg (ref 26.0–34.0)
MCH: 33.2 pg (ref 26.0–34.0)
MCHC: 31.4 g/dL (ref 30.0–36.0)
MCHC: 32.3 g/dL (ref 30.0–36.0)
MCHC: 31.8 g/dL (ref 30.0–36.0)
MCHC: 32.5 g/dL (ref 30.0–36.0)
MCV: 101.7 fL — ABNORMAL HIGH (ref 80.0–100.0)
MCV: 102.4 fL — ABNORMAL HIGH (ref 80.0–100.0)
MCV: 102.8 fL — ABNORMAL HIGH (ref 80.0–100.0)
MCV: 102 fL — ABNORMAL HIGH (ref 80.0–100.0)
Platelets: 149 10*3/uL — ABNORMAL LOW (ref 150–400)
Platelets: 157 10*3/uL (ref 150–400)
Platelets: 167 10*3/uL (ref 150–400)
Platelets: 151 10*3/uL (ref 150–400)
RBC: 2.35 MIL/uL — ABNORMAL LOW (ref 4.22–5.81)
RBC: 2.54 MIL/uL — ABNORMAL LOW (ref 4.22–5.81)
RBC: 2.48 MIL/uL — ABNORMAL LOW (ref 4.22–5.81)
RBC: 2.44 MIL/uL — ABNORMAL LOW (ref 4.22–5.81)
RDW: 14.4 % (ref 11.5–15.5)
RDW: 14.5 % (ref 11.5–15.5)
RDW: 14.6 % (ref 11.5–15.5)
RDW: 14.3 % (ref 11.5–15.5)
WBC: 5.1 10*3/uL (ref 4.0–10.5)
WBC: 5.8 10*3/uL (ref 4.0–10.5)
WBC: 4.9 10*3/uL (ref 4.0–10.5)
WBC: 5.7 10*3/uL (ref 4.0–10.5)
nRBC: 0 % (ref 0.0–0.2)
nRBC: 0 % (ref 0.0–0.2)
nRBC: 0 % (ref 0.0–0.2)
nRBC: 0 % (ref 0.0–0.2)

## 2018-09-06 LAB — GLUCOSE, CAPILLARY
Glucose-Capillary: 95 mg/dL (ref 70–99)
Glucose-Capillary: 103 mg/dL — ABNORMAL HIGH (ref 70–99)
Glucose-Capillary: 122 mg/dL — ABNORMAL HIGH (ref 70–99)
Glucose-Capillary: 124 mg/dL — ABNORMAL HIGH (ref 70–99)
Glucose-Capillary: 83 mg/dL (ref 70–99)
Glucose-Capillary: 97 mg/dL (ref 70–99)

## 2018-09-06 LAB — COMPREHENSIVE METABOLIC PANEL
ALT: 14 U/L (ref 0–44)
AST: 24 U/L (ref 15–41)
Albumin: 3.3 g/dL — ABNORMAL LOW (ref 3.5–5.0)
Alkaline Phosphatase: 41 U/L (ref 38–126)
Anion gap: 9 (ref 5–15)
BUN: 15 mg/dL (ref 8–23)
CO2: 21 mmol/L — ABNORMAL LOW (ref 22–32)
Calcium: 7.7 mg/dL — ABNORMAL LOW (ref 8.9–10.3)
Chloride: 110 mmol/L (ref 98–111)
Creatinine, Ser: 0.77 mg/dL (ref 0.61–1.24)
GFR calc Af Amer: >60 mL/min (ref >60)
GFR calc non Af Amer: >60 mL/min (ref >60)
Glucose, Bld: 85 mg/dL (ref 70–99)
Potassium: 3.8 mmol/L (ref 3.5–5.1)
Sodium: 140 mmol/L (ref 135–145)
Total Bilirubin: 0.7 mg/dL (ref 0.3–1.2)
Total Protein: 5.8 g/dL — ABNORMAL LOW (ref 6.5–8.1)

## 2018-09-06 LAB — PROTIME-INR
INR: 2.4 — ABNORMAL HIGH (ref 0.8–1.2)
Prothrombin Time: 26 seconds — ABNORMAL HIGH (ref 11.4–15.2)

## 2018-09-06 LAB — PHOSPHORUS: Phosphorus: 3.2 mg/dL (ref 2.5–4.6)

## 2018-09-06 LAB — ABO/RH: ABO/RH(D): O POS

## 2018-09-06 LAB — TSH: TSH: 2.411 u[IU]/mL (ref 0.350–4.500)

## 2018-09-06 LAB — HEMOGLOBIN A1C
Hgb A1c MFr Bld: 5.1 % (ref 4.8–5.6)
Mean Plasma Glucose: 99.67 mg/dL

## 2018-09-06 LAB — MAGNESIUM: Magnesium: 2.1 mg/dL (ref 1.7–2.4)

## 2018-09-06 MED ORDER — ORAL CARE MOUTH RINSE
15.0000 mL | Freq: Two times a day (BID) | OROMUCOSAL | Status: DC
  Administered 2018-09-06 – 2018-09-08 (×3): 15 mL via OROMUCOSAL

## 2018-09-06 MED ORDER — SODIUM CHLORIDE 0.9% FLUSH: 10.0000 mL | INTRAVENOUS | Status: DC | PRN

## 2018-09-06 NOTE — Progress Notes (Addendum)
PROGRESS NOTE    [name].  IR:[id number] DOB: Nov 05, 1935 DOA: 09/05/2018 PCP: Leighton Ruff, MD     Brief Narrative:  [name]. is a 83 yo male past medical history significant for diverticulosis/diverticulitis, adenomatous polyp, aortic valve replacement, status post bioprosthetic valve in 2012, hypertension, chronic atrial fibrillation on Coumadin, previous embolic stroke in [id number] status post PFO repair, diabetes who presents to the hospital with chief complaint of lightheadedness, presyncope for the past week.  Outpatient blood work revealed anemia from hemoglobin 12.3-->9.4.  Patient denied any nausea, vomiting, abdominal pain, bright red blood per rectum.  He thought his stools might be a little bit darker than usual.  Patient was admitted for symptomatic anemia secondary to suspected GI blood loss.  New events last 24 hours / Subjective: Feeling well this morning, denies any complaints of chest pain, shortness of breath, nausea or vomiting.  He does not have any dizziness at rest, has not been out of bed yet today.  Assessment & Plan:   Active Problems:   Essential hypertension   Gout   History of BPH   S/P AVR   S/P AVR (aortic valve replacement)   Diabetes mellitus type 2 in nonobese (HCC)   Persistent atrial fibrillation   History of ischemic left MCA stroke   S/P mitral valve repair    S/P tricuspid valve repair   S/P Maze operation for atrial fibrillation   Long term (current) use of anticoagulants   Symptomatic anemia   Anemia due to GI blood loss   GI bleed   Symptomatic anemia, secondary to GI blood loss -Continue to monitor CBC q6h -IV PPI  -GI consulted, planning for EGD 8/30  Persistent atrial fibrillation -CHA2DS2-VASc score 6 -Hold Coumadin, per EDP discussion with cardiology, patient is currently anticoagulated for A. fib, no need for bridge with IV heparin at this point while monitoring for GI bleed  Essential  hypertension -Continue atenolol  Type 2 diabetes, well controlled -Ha1c 5.1 -Sliding-scale insulin   BPH -Continue proscar        DVT prophylaxis: SCD, hold Coumadin Code Status: Full code Family Communication: None Disposition Plan: GI work-up pending  Patient will remain inpatient another night in the hospital for ongoing evaluation for his symptomatic anemia, secondary to GI blood loss. He has CBC q6h pending as well as EGD tentatively planned for 8/30 AM. He is at high risk of decompensation without close monitoring.    Consultants:   GI  Procedures:   None   Antimicrobials:  Anti-infectives (From admission, onward)   None        Objective: Vitals:   09/06/18 0800 09/06/18 0813 09/06/18 0900 09/06/18 0935  BP: (!) 155/72   (!) 155/72  Pulse: 74  82 71  Resp: 14  14   Temp:  98.8 F (37.1 C)    TempSrc:  Oral    SpO2: 96%  97%   Weight:      Height:        Intake/Output Summary (Last 24 hours) at 09/06/2018 1254 Last data filed at 09/06/2018 0600 Gross per 24 hour  Intake 679.55 ml  Output 850 ml  Net -170.45 ml   Filed Weights   09/05/18 2007 09/05/18 2120  Weight: 74.8 kg 73.6 kg    Examination:  General exam: Appears calm and comfortable  Respiratory system: Clear to auscultation. Respiratory effort normal. No respiratory distress. No conversational dyspnea.  Cardiovascular system: S1 & S2 heard. No murmurs. No  pedal edema. Gastrointestinal system: Abdomen is nondistended, soft and nontender. Normal bowel sounds heard. Central nervous system: Alert and oriented. No focal neurological deficits. Speech clear.  Extremities: Symmetric in appearance  Skin: No rashes, lesions or ulcers on exposed skin  Psychiatry: Judgement and insight appear normal. Mood & affect appropriate.   Data Reviewed: I have personally reviewed following labs and imaging studies  CBC: Recent Labs  Lab 09/05/18 1455 09/05/18 2256 09/06/18 0500 09/06/18 1000  WBC  4.9 6.2 5.1 5.8  HGB 8.9* 8.2* 7.5* 8.4*  HCT 28.0* 25.5* 23.9* 26.0*  MCV 104.1* 103.2* 101.7* 102.4*  PLT 176 154 149* [id number]   Basic Metabolic Panel: Recent Labs  Lab 09/05/18 1455 09/06/18 0500  NA 141 140  K 4.7 3.8  CL 108 110  CO2 25 21*  GLUCOSE 116* 85  BUN 21 15  CREATININE 1.04 0.77  CALCIUM 9.0 7.7*  MG  --  2.1  PHOS  --  3.2   GFR: Estimated Creatinine Clearance: 75.4 mL/min (by C-G formula based on SCr of 0.77 mg/dL). Liver Function Tests: Recent Labs  Lab 09/05/18 1455 09/06/18 0500  AST 24 24  ALT 17 14  ALKPHOS 57 41  BILITOT 0.7 0.7  PROT 7.6 5.8*  ALBUMIN 4.3 3.3*   No results for input(s): LIPASE, AMYLASE in the last 168 hours. No results for input(s): AMMONIA in the last 168 hours. Coagulation Profile: Recent Labs  Lab 09/05/18 1455 09/06/18 0500  INR 2.3* 2.4*   Cardiac Enzymes: No results for input(s): CKTOTAL, CKMB, CKMBINDEX, TROPONINI in the last 168 hours. BNP (last 3 results) No results for input(s): PROBNP in the last 8760 hours. HbA1C: Recent Labs    09/06/18 0500  HGBA1C 5.1   CBG: Recent Labs  Lab 09/05/18 2256 09/06/18 0334 09/06/18 0756 09/06/18 1215  GLUCAP 175* 95 103* 122*   Lipid Profile: No results for input(s): CHOL, HDL, LDLCALC, TRIG, CHOLHDL, LDLDIRECT in the last 72 hours. Thyroid Function Tests: Recent Labs    09/06/18 0500  TSH 2.411   Anemia Panel: Recent Labs    09/05/18 1455  VITAMINB12 552  FOLATE 24.1  FERRITIN 19*  TIBC 368  IRON 43*  RETICCTPCT 3.9*   Sepsis Labs: No results for input(s): PROCALCITON, LATICACIDVEN in the last 168 hours.  Recent Results (from the past 240 hour(s))  SARS Coronavirus 2 [facility] order, Performed in [facility] hospital lab) Nasopharyngeal Nasopharyngeal Swab     Status: None   Collection Time: 09/05/18  6:31 PM   Specimen: Nasopharyngeal Swab  Result Value Ref Range Status   SARS Coronavirus 2 NEGATIVE NEGATIVE Final    Comment: (NOTE) If  result is NEGATIVE SARS-CoV-2 target nucleic acids are NOT DETECTED. The SARS-CoV-2 RNA is generally detectable in upper and lower  respiratory specimens during the acute phase of infection. The lowest  concentration of SARS-CoV-2 viral copies this assay can detect is 250  copies / mL. A negative result does not preclude SARS-CoV-2 infection  and should not be used as the sole basis for treatment or other  patient management decisions.  A negative result may occur with  improper specimen collection / handling, submission of specimen other  than nasopharyngeal swab, presence of viral mutation(s) within the  areas targeted by this assay, and inadequate number of viral copies  (<250 copies / mL). A negative result must be combined with clinical  observations, patient history, and epidemiological information. If result is POSITIVE SARS-CoV-2 target nucleic acids are DETECTED.  The SARS-CoV-2 RNA is generally detectable in upper and lower  respiratory specimens dur ing the acute phase of infection.  Positive  results are indicative of active infection with SARS-CoV-2.  Clinical  correlation with patient history and other diagnostic information is  necessary to determine patient infection status.  Positive results do  not rule out bacterial infection or co-infection with other viruses. If result is PRESUMPTIVE POSTIVE SARS-CoV-2 nucleic acids MAY BE PRESENT.   A presumptive positive result was obtained on the submitted specimen  and confirmed on repeat testing.  While 2019 novel coronavirus  (SARS-CoV-2) nucleic acids may be present in the submitted sample  additional confirmatory testing may be necessary for epidemiological  and / or clinical management purposes  to differentiate between  SARS-CoV-2 and other Sarbecovirus currently known to infect humans.  If clinically indicated additional testing with an alternate test  methodology [phone]) is advised. The SARS-CoV-2 RNA is generally   detectable in upper and lower respiratory sp ecimens during the acute  phase of infection. The expected result is Negative. Fact Sheet for Patients:  StrictlyIdeas.no Fact Sheet for Healthcare Providers: BankingDealers.co.za This test is not yet approved or cleared by the [place] FDA and has been authorized for detection and/or diagnosis of SARS-CoV-2 by FDA under an Emergency Use Authorization (EUA).  This EUA will remain in effect (meaning this test can be used) for the duration of the COVID-19 declaration under Section 564(b)(1) of the Act, 21 U.S.C. section 360bbb-3(b)(1), unless the authorization is terminated or revoked sooner. Performed at [facility], [place] [address] [place], [place] [address]   MRSA PCR Screening     Status: None   Collection Time: 09/05/18  9:34 PM   Specimen: Nasal Mucosa; Nasopharyngeal  Result Value Ref Range Status   MRSA by PCR NEGATIVE NEGATIVE Final    Comment:        The GeneXpert MRSA Assay (FDA approved for NASAL specimens only), is one component of a comprehensive MRSA colonization surveillance program. It is not intended to diagnose MRSA infection nor to guide or monitor treatment for MRSA infections. Performed at [facility], [place] [address], [place], [place] [address]       Radiology Studies: No results found.    Scheduled Meds: . allopurinol  150 mg Oral Daily  . atenolol  12.5 mg Oral Daily  . Chlorhexidine Gluconate Cloth  6 each Topical Daily  . finasteride  5 mg Oral Daily  . insulin aspart  0-9 Units Subcutaneous Q4H  . mouth rinse  15 mL Mouth Rinse BID  . pantoprazole (PROTONIX) IV  40 mg Intravenous Q12H  . tamsulosin  0.8 mg Oral QPC supper   Continuous Infusions:   LOS: 0 days      Time spent: 40 minutes   Dessa Phi, DO [facility] [email or web address] 09/06/2018, 12:54 PM

## 2018-09-06 NOTE — Consult Note (Signed)
Referring Provider:  EDP Primary Care Physician:  Leighton Ruff, MD Primary Gastroenterologist:  Dr. Amedeo Plenty  Reason for Consultation:   [name] bleed   HPI: [name]. is a 83 y.o. male with past medical history of atrial fibrillation on Coumadin, history of bioprosthetic aortic valve replacement, history of CVA in 2018 presented to the hospital for further evaluation of anemia with hemoglobin of 9.4.  While in the ER, he was found to have heme positive stool and dark red stools .  GI is consulted for further evaluation.  Patient seen and examined at bedside.  He is complaining of dark/almost black-colored stool for last 1 week.  Started feeling lightheadedness around same time.  Denies any nausea or vomiting.  Denies any abdominal pain, diarrhea or constipation.  Denies dysphagia or odynophagia.  Denies NSAID use.    No previous EGD.  Last colonoscopy in March 2018 by Dr. Amedeo Plenty showed 8 mm tubular adenoma in the cecum and diverticulosis. Past Medical History:  Diagnosis Date  . Bladder stones   . Borderline diabetes   . BPH (benign prostatic hyperplasia)   . Coronary artery disease    cardiologist-  dr Nat Math gerhart NP--- per cath 06-02-2010 non-obstructive cad pLAD 30-40%  . Diverticulosis of colon   . Dysrhythmia    afib  . Gout   . Heart murmur   . History of adenomatous polyp of colon    2002-- tubular adenoma  . History of aortic insufficiency    severe -- s/p  AVR 08-03-2010  . History of small bowel obstruction    02/ 2007 mechanical sbo s/p  surgical intervention;  partial sbo 09/ 2011 and 03-20-2011 resolved without surgical intervention  . History of urinary retention   . HTN (hypertension)   . Peripheral neuropathy   . Persistent atrial fibrillation   . S/P aortic valve replacement with prosthetic valve 08/03/2010   tissue valve  . S/P Maze operation for atrial fibrillation 01/30/2018   Complete bilateral atrial lesion set using bipolar  radiofrequency and cryothermy with clipping of LA appendage  . S/P MVR (mitral valve repair) 01/30/2018   Complex valvuloplasty including artificial Gore-tex neochord placement x4 and Carbo medics Annuloflex ring annuloplasty, size 28  . S/P patent foramen ovale closure 08/03/2010   at same time AVR  . S/P tricuspid valve repair 01/30/2018   Using an MC3 Annuloplasty ring, size 28  . Stroke ([name])   . Tricuspid regurgitation     Past Surgical History:  Procedure Laterality Date  . CARDIAC CATHETERIZATION  06-02-2010  dr Marlou Porch   non-obstructive cad- pLAD 30-40%/  normal LVSF/  severe AI  . CARDIOVASCULAR STRESS TEST  04/12/2016   Low risk nuclear perfusion study w/ no significant reversible ischemia/  normal LV function and wall motion ,  stress ef 60%/  85mm inferior and lateral scooped ST-segment depression w/ exercise (may be repolarization abnormality), exercise capacity was moderately reduced  . CATARACT EXTRACTION W/ INTRAOCULAR LENS  IMPLANT, BILATERAL  02/2010  . CLIPPING OF ATRIAL APPENDAGE N/A 01/30/2018   Procedure: CLIPPING OF LEFT ATRIAL APPENDAGE USING ATRICLIP PRO2 45MM;  Surgeon: Rexene Alberts, MD;  Location: [facility];  Service: Open Heart Surgery;  Laterality: N/A;  . CYSTOSCOPY WITH LITHOLAPAXY N/A 06/05/2016   Procedure: CYSTOSCOPY WITH LITHOLAPAXY and fulgarization of bladder neck;  Surgeon: Irine Seal, MD;  Location: [facility];  Service: Urology;  Laterality: N/A;  . EXPLORATORY LAPARTOMY /  CHOLECYSTECTOMY  02/28/2005  for Small  bowel obstruction (mechnical)  . IR ANGIO EXTRACRAN SEL COM CAROTID INNOMINATE UNI L MOD SED  10/24/2016  . IR ANGIO VERTEBRAL SEL SUBCLAVIAN INNOMINATE BILAT MOD SED  10/24/2016  . IR PERCUTANEOUS ART THROMBECTOMY/INFUSION INTRACRANIAL INC DIAG ANGIO  10/24/2016  . IR RADIOLOGIST EVAL & MGMT  12/05/2016  . LEFT KNEE ARTHROSCOPY  2006  . MAZE N/A 01/30/2018   Procedure: MAZE;  Surgeon: Rexene Alberts, MD;  Location: [place];   Service: Open Heart Surgery;  Laterality: N/A;  . MITRAL VALVE REPAIR N/A 01/30/2018   Procedure: MITRAL VALVE REPAIR (MVR) USING CARBOMEDICS ANNULOFLEX SIZE 28;  Surgeon: Rexene Alberts, MD;  Location: [place];  Service: Open Heart Surgery;  Laterality: N/A;  . RADIOLOGY WITH ANESTHESIA N/A 10/24/2016   Procedure: RADIOLOGY WITH ANESTHESIA;  Surgeon: Luanne Bras, MD;  Location: [place];  Service: Radiology;  Laterality: N/A;  . RIGHT FOOT SURGERY    . RIGHT MINIATURE ANTERIOR THORACOTOMY FOR AORTIC VALVE REPLACEMENT AND CLOSURE PATENT FORAMEN OVALE  08-03-2010  DR Levada Schilling Magna-ease pericardial tissue valve (7mm)  . RIGHT/LEFT HEART CATH AND CORONARY ANGIOGRAPHY N/A 10/14/2017   Procedure: RIGHT/LEFT HEART CATH AND CORONARY ANGIOGRAPHY;  Surgeon: [name], MD;  Location: [facility];  Service: Cardiovascular;  Laterality: N/A;  . TEE WITHOUT CARDIOVERSION N/A 10/14/2017   Procedure: TRANSESOPHAGEAL ECHOCARDIOGRAM (TEE);  Surgeon: Josue Hector, MD;  Location: [facility] ENDOSCOPY;  Service: Cardiovascular;  Laterality: N/A;  . TRANSTHORACIC ECHOCARDIOGRAM  05/30/2016  dr skains   moderate  LVH ef 60-65%/  bioprothesis aortic valve present ,normal grandient and no AI /  mild MV calcification , moderate MR /  mild PR/ moderate TR/  PASP 68mmHg/ (RA denisty was identified 04-27-2016 echo) and is seen again today, this is likely a promient eustacian ridge, atrium is normal size  . TRICUSPID VALVE REPLACEMENT N/A 01/30/2018   Procedure: TRICUSPID VALVE REPAIR USING MC3 SIZE 28;  Surgeon: Rexene Alberts, MD;  Location: [place];  Service: Open Heart Surgery;  Laterality: N/A;    Prior to Admission medications   Medication Sig Start Date End Date Taking? Authorizing Provider  allopurinol (ZYLOPRIM) 300 MG tablet Take 150 mg by mouth daily.   Yes [provider]  amoxicillin (AMOXIL) 500 MG capsule Take 2,000 mg by mouth See admin instructions. Take 2000 mg by mouth 1 hour  prior to dental appointment   Yes [provider]  aspirin EC 81 MG EC tablet Take 1 tablet (81 mg total) by mouth daily. 02/05/18  Yes Lars Pinks M, PA-C  atenolol (TENORMIN) 25 MG tablet Take 0.5 tablets (12.5 mg total) by mouth 2 (two) times daily. Patient taking differently: Take 12.5 mg by mouth daily.  03/24/18 09/05/18 Yes Burtis Junes, NP  b complex vitamins tablet Take 1 tablet by mouth daily.   Yes [provider]  diphenhydrAMINE (BENADRYL) 2 % cream Apply 1 application topically daily as needed for itching.   Yes [provider]  finasteride (PROSCAR) 5 MG tablet Take 1 tablet (5 mg total) by mouth daily. 11/05/16  Yes Angiulli, Lavon Paganini, PA-C  lisinopril (ZESTRIL) 5 MG tablet Take 5 mg by mouth daily.   Yes [provider]  tamsulosin (FLOMAX) 0.4 MG CAPS capsule Take 2 capsules (0.8 mg total) by mouth daily after breakfast. Patient taking differently: Take 0.8 mg by mouth daily after supper.  11/05/16  Yes Angiulli, Lavon Paganini, PA-C  traZODone (DESYREL) 50  MG tablet Take 50 mg by mouth at bedtime as needed for sleep.  01/20/18  Yes [provider]  warfarin (COUMADIN) 5 MG tablet Take 1 tablet daily except 1.5 tablets on Monday and Friday or as directed By Anticoagulation Clinic. 07/22/18  Yes Jerline Pain, MD    Scheduled Meds: . allopurinol  150 mg Oral Daily  . atenolol  12.5 mg Oral Daily  . Chlorhexidine Gluconate Cloth  6 each Topical Daily  . finasteride  5 mg Oral Daily  . insulin aspart  0-9 Units Subcutaneous Q4H  . mouth rinse  15 mL Mouth Rinse BID  . pantoprazole (PROTONIX) IV  40 mg Intravenous Q12H  . tamsulosin  0.8 mg Oral QPC supper   Continuous Infusions: PRN Meds:.acetaminophen **OR** acetaminophen, HYDROcodone-acetaminophen, labetalol, ondansetron **OR** ondansetron (ZOFRAN) IV, sodium chloride flush, traZODone  Allergies as of 09/05/2018 - Review Complete 09/05/2018  Allergen Reaction Noted  . Sulfa  antibiotics Other (See Comments) 09/25/2010    Family History  Problem Relation Age of Onset  . Heart disease Mother   . Brain cancer Father     Social History   Socioeconomic History  . Marital status: Married    Spouse name: Not on file  . Number of children: 2  . Years of education: Not on file  . Highest education level: Not on file  Occupational History  . Occupation: RETIRED  Social Needs  . Financial resource strain: Not on file  . Food insecurity    Worry: Not on file    Inability: Not on file  . Transportation needs    Medical: Not on file    Non-medical: Not on file  Tobacco Use  . Smoking status: Never Smoker  . Smokeless tobacco: Never Used  Substance and Sexual Activity  . Alcohol use: Yes    Comment: ONE OR TWO PER MONTH  . Drug use: No  . Sexual activity: Not on file  Lifestyle  . Physical activity    Days per week: Not on file    Minutes per session: Not on file  . Stress: Not on file  Relationships  . Social Herbalist on phone: Not on file    Gets together: Not on file    Attends religious service: Not on file    Active member of club or organization: Not on file    Attends meetings of clubs or organizations: Not on file    Relationship status: Not on file  . Intimate partner violence    Fear of current or ex partner: Not on file    Emotionally abused: Not on file    Physically abused: Not on file    Forced sexual activity: Not on file  Other Topics Concern  . Not on file  Social History Narrative  . Not on file    Review of Systems: Review of Systems  Constitutional: Positive for malaise/fatigue. Negative for chills and fever.  HENT: Negative for hearing loss and tinnitus.   Eyes: Negative for blurred vision and double vision.  Respiratory: Negative for cough and hemoptysis.   Cardiovascular: Negative for chest pain and palpitations.  Gastrointestinal: Positive for melena. Negative for abdominal pain, constipation,  diarrhea, heartburn, nausea and vomiting.  Genitourinary: Negative for dysuria and urgency.  Musculoskeletal: Positive for myalgias. Negative for falls.  Skin: Negative for itching and rash.  Neurological: Positive for dizziness. Negative for loss of consciousness.  Endo/Heme/Allergies: Does not bruise/bleed easily.  Psychiatric/Behavioral: Negative for depression  and hallucinations.    Physical Exam: Vital signs: Vitals:   09/06/18 0900 09/06/18 0935  BP:  (!) 155/72  Pulse: 82 71  Resp: 14   Temp:    SpO2: 97%    Last BM Date: 09/05/18 Physical Exam  Constitutional: He is oriented to person, place, and time. He appears well-developed and well-nourished.  HENT:  Head: Normocephalic and atraumatic.  Mouth/Throat: Oropharynx is clear and moist. No oropharyngeal exudate.  Eyes: EOM are normal. No scleral icterus.  Neck: Normal range of motion. Neck supple.  Cardiovascular: Normal rate and regular rhythm.  Pulmonary/Chest: Effort normal and breath sounds normal. No respiratory distress.  Abdominal: Soft. Bowel sounds are normal. He exhibits no distension. There is no abdominal tenderness. There is no rebound and no guarding.  Musculoskeletal: Normal range of motion.        General: No edema.  Neurological: He is alert and oriented to person, place, and time.  Skin: Skin is warm. No erythema.  Psychiatric: He has a normal mood and affect. Judgment and thought content normal.  Vitals reviewed.  GI:  Lab Results: Recent Labs    09/05/18 1455 09/05/18 2256 09/06/18 0500  WBC 4.9 6.2 5.1  HGB 8.9* 8.2* 7.5*  HCT 28.0* 25.5* 23.9*  PLT 176 154 149*   BMET Recent Labs    09/05/18 1455 09/06/18 0500  NA 141 140  K 4.7 3.8  CL 108 110  CO2 25 21*  GLUCOSE 116* 85  BUN 21 15  CREATININE 1.04 0.77  CALCIUM 9.0 7.7*   LFT Recent Labs    09/06/18 0500  PROT 5.8*  ALBUMIN 3.3*  AST 24  ALT 14  ALKPHOS 41  BILITOT 0.7   PT/INR Recent Labs    09/05/18 1455  09/06/18 0500  LABPROT 25.1* 26.0*  INR 2.3* 2.4*     Studies/Results: No results found.  Impression/Plan: -Acute blood loss/symptomatic anemia .  Hemoglobin down to 7.5. - Dark/almost black-colored stool.  Heme positive.  Could be upper GI bleed versus proximal colonic bleed.  Last colonoscopy in March 2018 showed 8 mm adenomatous polyp in the cecum and diverticulosis. -History of atrial fibrillation.  Anticoagulation on hold.  INR 2.4 today  Recommendations ---------------------- -Monitor H&H.  Transfuse to keep hemoglobin around 7-8. -Plan for EGD tomorrow if INR less than 2.  If EGD negative, consider colonoscopy if ongoing bleeding -Continue to hold Coumadin.  Recheck CBC and INR tomorrow. -Start full liquid diet, n.p.o. past midnight.  Continue PPI.  Risks (bleeding, infection, bowel perforation that could require surgery, sedation-related changes in cardiopulmonary systems), benefits (identification and possible treatment of source of symptoms, exclusion of certain causes of symptoms), and alternatives (watchful waiting, radiographic imaging studies, empiric medical treatment)  were explained to patient/family in detail and patient wishes to proceed.   LOS: 0 days   Otis Brace  MD, [place] 09/06/2018, 10:24 AM  Contact #  [phone]

## 2018-09-07 ENCOUNTER — Encounter (HOSPITAL_COMMUNITY)
Admission: EM | Disposition: A | Discharge status: Home / Self Care | Payer: Self-pay | Source: Home / Self Care | Attending: Internal Medicine

## 2018-09-07 ENCOUNTER — Inpatient Hospital Stay (HOSPITAL_COMMUNITY): Payer: Medicare Other | Admitting: Anesthesiology

## 2018-09-07 ENCOUNTER — Encounter (HOSPITAL_COMMUNITY): Payer: Self-pay | Admitting: Anesthesiology

## 2018-09-07 HISTORY — PX: BIOPSY: SHX5522

## 2018-09-07 HISTORY — PX: ESOPHAGOGASTRODUODENOSCOPY (EGD) WITH PROPOFOL: SHX5813

## 2018-09-07 LAB — GLUCOSE, CAPILLARY
Glucose-Capillary: 91 mg/dL (ref 70–99)
Glucose-Capillary: 113 mg/dL — ABNORMAL HIGH (ref 70–99)
Glucose-Capillary: 133 mg/dL — ABNORMAL HIGH (ref 70–99)
Glucose-Capillary: 122 mg/dL — ABNORMAL HIGH (ref 70–99)

## 2018-09-07 LAB — BASIC METABOLIC PANEL
Anion gap: 6 (ref 5–15)
BUN: 15 mg/dL (ref 8–23)
CO2: 24 mmol/L (ref 22–32)
Calcium: 8.4 mg/dL — ABNORMAL LOW (ref 8.9–10.3)
Chloride: 109 mmol/L (ref 98–111)
Creatinine, Ser: 0.95 mg/dL (ref 0.61–1.24)
GFR calc Af Amer: >60 mL/min (ref >60)
GFR calc non Af Amer: >60 mL/min (ref >60)
Glucose, Bld: 109 mg/dL — ABNORMAL HIGH (ref 70–99)
Potassium: 4.1 mmol/L (ref 3.5–5.1)
Sodium: 139 mmol/L (ref 135–145)

## 2018-09-07 LAB — CBC
HCT: 27 % — ABNORMAL LOW (ref 39.0–52.0)
Hemoglobin: 8.5 g/dL — ABNORMAL LOW (ref 13.0–17.0)
MCH: 32.3 pg (ref 26.0–34.0)
MCHC: 31.5 g/dL (ref 30.0–36.0)
MCV: 102.7 fL — ABNORMAL HIGH (ref 80.0–100.0)
Platelets: 165 10*3/uL (ref 150–400)
RBC: 2.63 MIL/uL — ABNORMAL LOW (ref 4.22–5.81)
RDW: 14.4 % (ref 11.5–15.5)
WBC: 5.6 10*3/uL (ref 4.0–10.5)
nRBC: 0 % (ref 0.0–0.2)

## 2018-09-07 LAB — PROTIME-INR
INR: 1.7 — ABNORMAL HIGH (ref 0.8–1.2)
Prothrombin Time: 20 seconds — ABNORMAL HIGH (ref 11.4–15.2)

## 2018-09-07 SURGERY — ESOPHAGOGASTRODUODENOSCOPY (EGD) WITH PROPOFOL
Anesthesia: Monitor Anesthesia Care

## 2018-09-07 MED ORDER — ONDANSETRON HCL 4 MG/2ML IJ SOLN
INTRAMUSCULAR | Status: DC | PRN
  Administered 2018-09-07: 4 mg via INTRAVENOUS

## 2018-09-07 MED ORDER — LIDOCAINE 2% (20 MG/ML) 5 ML SYRINGE
INTRAMUSCULAR | Status: DC | PRN
  Administered 2018-09-07: 60 mg via INTRAVENOUS

## 2018-09-07 MED ORDER — PROPOFOL 500 MG/50ML IV EMUL
INTRAVENOUS | Status: DC | PRN
  Administered 2018-09-07: 125 ug/kg/min via INTRAVENOUS

## 2018-09-07 MED ORDER — GLYCOPYRROLATE 0.2 MG/ML IJ SOLN
INTRAMUSCULAR | Status: DC | PRN
  Administered 2018-09-07 (×2): 0.1 mg via INTRAVENOUS

## 2018-09-07 MED ORDER — SODIUM CHLORIDE 0.9 % IV SOLN
INTRAVENOUS | Status: DC | PRN
  Administered 2018-09-07: 09:00:00 via INTRAVENOUS

## 2018-09-07 MED ORDER — INSULIN ASPART 100 UNIT/ML ~~LOC~~ SOLN
0.0000 [IU] | Freq: Three times a day (TID) | SUBCUTANEOUS | Status: DC
  Administered 2018-09-07 (×2): 1 [IU] via SUBCUTANEOUS

## 2018-09-07 MED ORDER — PROPOFOL 10 MG/ML IV BOLUS
INTRAVENOUS | Status: DC | PRN
  Administered 2018-09-07 (×2): 20 mg via INTRAVENOUS

## 2018-09-07 MED ORDER — PROPOFOL 10 MG/ML IV BOLUS
INTRAVENOUS | Status: AC
  Filled 2018-09-07: qty 60

## 2018-09-07 SURGICAL SUPPLY — 14 items

## 2018-09-07 NOTE — Transfer of Care (Signed)
Immediate Anesthesia Transfer of Care Note  Patient: [name].  Procedure(s) Performed: ESOPHAGOGASTRODUODENOSCOPY (EGD) WITH PROPOFOL (N/A ) BIOPSY  Patient Location: ICU  Anesthesia Type:MAC  Level of Consciousness: awake, alert  and oriented  Airway & Oxygen Therapy: Patient Spontanous Breathing and Patient connected to nasal cannula oxygen  Post-op Assessment: Report given to RN and Post -op Vital signs reviewed and stable  Post vital signs: Reviewed and stable  Last Vitals:  Vitals Value Taken Time  BP 136/78 09/07/18 0935  Temp    Pulse 69 09/07/18 0942  Resp 11 09/07/18 0942  SpO2 100 % 09/07/18 0942  Vitals shown include unvalidated device data.  Last Pain:  Vitals:   09/07/18 0935  TempSrc:   PainSc: 0-No pain         Complications: No apparent anesthesia complications

## 2018-09-07 NOTE — Brief Op Note (Signed)
09/05/2018 - 09/07/2018  9:48 AM  PATIENT:  [name].  83 y.o. male  PRE-OPERATIVE DIAGNOSIS:  Anemia, dark stools  POST-OPERATIVE DIAGNOSIS:  * No post-op diagnosis entered *  PROCEDURE:  Procedure(s): ESOPHAGOGASTRODUODENOSCOPY (EGD) WITH PROPOFOL (N/A) BIOPSY  SURGEON:  Surgeon(s) and Role:    * Alicja Everitt, MD - Primary  Findings ---------- -EGD showed gastritis and few white specks in the distal esophagus concerning for Candida esophagitis.  Biopsies taken.  No evidence of active bleeding.  Recommendations ------------------------ -Findings discussed with the patient.  Options for scheduling colonoscopy tomorrow versus conservative management with repeating blood count tomorrow discussed.  Patient prefers conservative management.  If evidence of ongoing drop in hemoglobin  or active bleeding, then will consider colonoscopy for Tuesday. -May consider restarting Coumadin tomorrow depending on repeat CBC  -Okay to start full liquid diet. -GI will follow  Otis Brace MD, [place] 09/07/2018, 9:50 AM  Contact #  [phone]

## 2018-09-07 NOTE — Progress Notes (Signed)
[facility] Gastroenterology Progress Note  [name]. 83 y.o. 13-Oct-1935  CC: Anemia, GI bleed   Subjective: No acute issues overnight.  Dark stools are getting lighter.  Denies any bright red blood per rectum.  INR trending down.   Objective: Vital signs in last 24 hours: Vitals:   09/07/18 0757 09/07/18 0838  BP: (!) 154/73 (!) 176/86  Pulse: 69 70  Resp: 13 20  Temp:    SpO2: 97% 98%    Physical Exam:  General:  Alert, cooperative, no distress, appears stated age  Head:  Normocephalic, without obvious abnormality, atraumatic  Eyes:  , EOM's intact,   Lungs:   Clear to auscultation bilaterally, respirations unlabored, anterior exam only  Heart:  Regular rate and rhythm, S1, S2 normal  Abdomen:   Soft, non-tender, nondistended, bowel sounds present, no peritoneal signs  Extremities: Extremities normal, atraumatic, no  edema       Lab Results: Recent Labs    09/06/18 0500 09/07/18 0500  NA 140 139  K 3.8 4.1  CL 110 109  CO2 21* 24  GLUCOSE 85 109*  BUN 15 15  CREATININE 0.77 0.95  CALCIUM 7.7* 8.4*  MG 2.1  --   PHOS 3.2  --    Recent Labs    09/05/18 1455 09/06/18 0500  AST 24 24  ALT 17 14  ALKPHOS 57 41  BILITOT 0.7 0.7  PROT 7.6 5.8*  ALBUMIN 4.3 3.3*   Recent Labs    09/06/18 2212 09/07/18 0400  WBC 5.7 5.6  HGB 8.1* 8.5*  HCT 24.9* 27.0*  MCV 102.0* 102.7*  PLT 151 165   Recent Labs    09/06/18 0500 09/07/18 0500  LABPROT 26.0* 20.0*  INR 2.4* 1.7*      Assessment/Plan: Acute blood loss/symptomatic anemia .  Hemoglobin down to 7.5. - Dark/almost black-colored stool.  Heme positive.  Could be upper GI bleed versus proximal colonic bleed.  Last colonoscopy in March 2018 showed 8 mm adenomatous polyp in the cecum and diverticulosis. -History of atrial fibrillation.  Anticoagulation on hold.  INR 2.4 today  Recommendations ---------------------- -EGD today,   If EGD negative, consider colonoscopy if ongoing  bleeding -Continue to hold Coumadin.    Risks (bleeding, infection, bowel perforation that could require surgery, sedation-related changes in cardiopulmonary systems), benefits (identification and possible treatment of source of symptoms, exclusion of certain causes of symptoms), and alternatives (watchful waiting, radiographic imaging studies, empiric medical treatment)  were explained to patient/family in detail and patient wishes to proceed.    Otis Brace MD, [place] 09/07/2018, 8:40 AM  Contact #  [phone]

## 2018-09-07 NOTE — Op Note (Signed)
[facility] Patient Name: [name] Procedure Date: 09/07/2018 MRN: [id number] Attending MD: Otis Brace , MD Date of Birth: March 19, 1935 CSN: [id number] Age: 83 Admit Type: Inpatient Procedure:                Upper GI endoscopy Indications:              Heme positive stool, Suspected upper                            gastrointestinal bleeding Providers:                Otis Brace, MD, Burtis Junes, RN, Ladona Ridgel, Technician Referring MD:              Medicines:                Sedation Administered by an Anesthesia Professional Complications:            No immediate complications. Estimated Blood Loss:     Estimated blood loss was minimal. Procedure:                Pre-Anesthesia Assessment:                           - Prior to the procedure, a History and Physical                            was performed, and patient medications and                            allergies were reviewed. The patient's tolerance of                            previous anesthesia was also reviewed. The risks                            and benefits of the procedure and the sedation                            options and risks were discussed with the patient.                            All questions were answered, and informed consent                            was obtained. Prior Anticoagulants: The patient has                            taken Coumadin (warfarin), last dose was 3 days                            prior to procedure. ASA Grade Assessment: III - A  patient with severe systemic disease. After                            reviewing the risks and benefits, the patient was                            deemed in satisfactory condition to undergo the                            procedure.                           After obtaining informed consent, the endoscope was                            passed under direct vision.  Throughout the                            procedure, the patient's blood pressure, pulse, and                            oxygen saturations were monitored continuously. The                            [id number]) Olympus gastroscope was                            introduced through the mouth, and advanced to the                            second part of duodenum. The upper GI endoscopy was                            accomplished without difficulty. The patient                            tolerated the procedure well. Scope In: Scope Out: Findings:      The Z-line was regular and was found 40 cm from the incisors.      Patchy, white plaques were found in the distal esophagus. Biopsies were       taken with a cold forceps for histology.      Scattered moderate inflammation with hemorrhage characterized by       congestion (edema), erythema and friability was found in the gastric       body. Biopsies were taken with a cold forceps for histology.      The cardia and gastric fundus were normal on retroflexion.      The duodenal bulb, first portion of the duodenum and second portion of       the duodenum were normal. Impression:               - Z-line regular, 40 cm from the incisors.                           - Esophageal plaques were found, suspicious for  candidiasis. Biopsied.                           - Gastritis with hemorrhage. Biopsied.                           - Normal duodenal bulb, first portion of the                            duodenum and second portion of the duodenum. Moderate Sedation:      Moderate (conscious) sedation was personally administered by an       anesthesia professional. The following parameters were monitored: oxygen       saturation, heart rate, blood pressure, and response to care. Recommendation:           - Return patient to hospital ward for ongoing care.                           - Clear liquid diet.                            - Continue present medications.                           - Await pathology results. Procedure Code(s):        --- Professional ---                           [phone], Esophagogastroduodenoscopy, flexible,                            transoral; with biopsy, single or multiple Diagnosis Code(s):        --- Professional ---                           K22.9, Disease of esophagus, unspecified                           K29.71, Gastritis, unspecified, with bleeding                           R19.5, Other fecal abnormalities CPT copyright 2019 [employer]. All rights reserved. The codes documented in this report are preliminary and upon coder review may  be revised to meet current compliance requirements. Otis Brace, MD Otis Brace, MD 09/07/2018 9:17:23 AM Number of Addenda: 0

## 2018-09-07 NOTE — Anesthesia Postprocedure Evaluation (Signed)
Anesthesia Post Note  Patient: [name].  Procedure(s) Performed: ESOPHAGOGASTRODUODENOSCOPY (EGD) WITH PROPOFOL (N/A ) BIOPSY     Patient location during evaluation: PACU Anesthesia Type: MAC Level of consciousness: awake and alert Pain management: pain level controlled Vital Signs Assessment: post-procedure vital signs reviewed and stable Respiratory status: spontaneous breathing, nonlabored ventilation, respiratory function stable and patient connected to nasal cannula oxygen Cardiovascular status: stable and blood pressure returned to baseline Postop Assessment: no apparent nausea or vomiting Anesthetic complications: no    Last Vitals:  Vitals:   09/07/18 0757 09/07/18 0838  BP: (!) 154/73 (!) 176/86  Pulse: 69 70  Resp: 13 20  Temp:    SpO2: 97% 98%    Last Pain:  Vitals:   09/07/18 0838  TempSrc: Temporal  PainSc: 0-No pain                 Effie Berkshire

## 2018-09-07 NOTE — Progress Notes (Signed)
PROGRESS NOTE    Gerald Amas.  IR:[id number] DOB: [birth year] DOA: 09/05/2018 PCP: Leighton Ruff, MD     Brief Narrative:  Gerald Mcconnell. is a 83 yo male past medical history significant for diverticulosis/diverticulitis, adenomatous polyp, aortic valve replacement, status post bioprosthetic valve in 2012, hypertension, chronic atrial fibrillation on Coumadin, previous embolic stroke in [id number] status post PFO repair, diabetes who presents to the hospital with chief complaint of lightheadedness, presyncope for the past week.  Outpatient blood work revealed anemia from hemoglobin 12.3-->9.4.  Patient denied any nausea, vomiting, abdominal pain, bright red blood per rectum.  He thought his stools might be a little bit darker than usual.  Patient was admitted for symptomatic anemia secondary to suspected GI blood loss.  New events last 24 hours / Subjective: No new complaints this morning.  Has been able to get up to go to the bathroom without any dizziness.  Awaiting EGD at time of my examination  Assessment & Plan:   Principal Problem:   Symptomatic anemia Active Problems:   Essential hypertension   Gout   History of BPH   S/P AVR   S/P AVR (aortic valve replacement)   Diabetes mellitus type 2 in nonobese (HCC)   Persistent atrial fibrillation   History of ischemic left MCA stroke   S/P mitral valve repair    S/P tricuspid valve repair   S/P Maze operation for atrial fibrillation   Long term (current) use of anticoagulants   Anemia due to GI blood loss   GI bleed   Symptomatic anemia, secondary to GI blood loss -GI following, EGD 8/30.  Found gastritis, Candida esophagitis -Continue to monitor CBC, INR  -IV PPI   -Continue to monitor, possible colonoscopy this admission versus conservative management -Continue to hold Coumadin for now -Hemoglobin stable this morning 8.5  Persistent atrial fibrillation -CHA2DS2-VASc score 6 -Hold Coumadin, per EDP discussion  with cardiology, patient is currently anticoagulated for A. fib, no need for bridge with IV heparin at this point while monitoring for GI bleed  Essential hypertension -Continue atenolol  Type 2 diabetes, well controlled -Ha1c 5.1 -Sliding-scale insulin   BPH -Continue proscar        DVT prophylaxis: SCD, hold Coumadin Code Status: Full code Family Communication: None Disposition Plan: GI work-up pending   Consultants:   GI  Procedures:   EGD 8/30 Impression:       - Z-line regular, 40 cm from the incisors.                           - Esophageal plaques were found, suspicious for                            candidiasis. Biopsied.                           - Gastritis with hemorrhage. Biopsied.                           - Normal duodenal bulb, first portion of the                            duodenum and second portion of the duodenum.  Antimicrobials:  Anti-infectives (From admission, onward)   None  Objective: Vitals:   09/07/18 0800 09/07/18 0838 09/07/18 0935 09/07/18 1000  BP: (!) 158/74 (!) 176/86 (!) 89/75 124/69  Pulse: 67 70 61 61  Resp: 16 20 15 13   Temp:  97.7 F (36.5 C)    TempSrc:  Temporal    SpO2: 97% 98% 99% 98%  Weight:      Height:        Intake/Output Summary (Last 24 hours) at 09/07/2018 1144 Last data filed at 09/07/2018 1000 Gross per 24 hour  Intake 1317.88 ml  Output 225 ml  Net 1092.88 ml   Filed Weights   09/05/18 2007 09/05/18 2120  Weight: 74.8 kg 73.6 kg    Examination: General exam: Appears calm and comfortable  Respiratory system: Clear to auscultation. Respiratory effort normal. Cardiovascular system: S1 & S2 heard, irregular rhythm, rate 60s. No JVD, murmurs, rubs, gallops or clicks. No pedal edema. Gastrointestinal system: Abdomen is nondistended, soft and nontender. No organomegaly or masses felt. Normal bowel sounds heard. Central nervous system: Alert and oriented. No focal neurological  deficits. Extremities: Symmetric 5 x 5 power. Skin: No rashes, lesions or ulcers Psychiatry: Judgement and insight appear normal. Mood & affect appropriate.   Data Reviewed: I have personally reviewed following labs and imaging studies  CBC: Recent Labs  Lab 09/06/18 0500 09/06/18 1000 09/06/18 1600 09/06/18 2212 09/07/18 0400  WBC 5.1 5.8 4.9 5.7 5.6  HGB 7.5* 8.4* 8.1* 8.1* 8.5*  HCT 23.9* 26.0* 25.5* 24.9* 27.0*  MCV 101.7* 102.4* 102.8* 102.0* 102.7*  PLT 149* 157 167 151 [id number]   Basic Metabolic Panel: Recent Labs  Lab 09/05/18 1455 09/06/18 0500 09/07/18 0500  NA 141 140 139  K 4.7 3.8 4.1  CL 108 110 109  CO2 25 21* 24  GLUCOSE 116* 85 109*  BUN 21 15 15   CREATININE 1.04 0.77 0.95  CALCIUM 9.0 7.7* 8.4*  MG  --  2.1  --   PHOS  --  3.2  --    GFR: Estimated Creatinine Clearance: 63.5 mL/min (by C-G formula based on SCr of 0.95 mg/dL). Liver Function Tests: Recent Labs  Lab 09/05/18 1455 09/06/18 0500  AST 24 24  ALT 17 14  ALKPHOS 57 41  BILITOT 0.7 0.7  PROT 7.6 5.8*  ALBUMIN 4.3 3.3*   No results for input(s): LIPASE, AMYLASE in the last 168 hours. No results for input(s): AMMONIA in the last 168 hours. Coagulation Profile: Recent Labs  Lab 09/05/18 1455 09/06/18 0500 09/07/18 0500  INR 2.3* 2.4* 1.7*   Cardiac Enzymes: No results for input(s): CKTOTAL, CKMB, CKMBINDEX, TROPONINI in the last 168 hours. BNP (last 3 results) No results for input(s): PROBNP in the last 8760 hours. HbA1C: Recent Labs    09/06/18 0500  HGBA1C 5.1   CBG: Recent Labs  Lab 09/06/18 1953 09/06/18 2246 09/07/18 0322 09/07/18 0746 09/07/18 1114  GLUCAP 83 97 91 113* 133*   Lipid Profile: No results for input(s): CHOL, HDL, LDLCALC, TRIG, CHOLHDL, LDLDIRECT in the last 72 hours. Thyroid Function Tests: Recent Labs    09/06/18 0500  TSH 2.411   Anemia Panel: Recent Labs    09/05/18 1455  VITAMINB12 552  FOLATE 24.1  FERRITIN 19*  TIBC 368   IRON 43*  RETICCTPCT 3.9*   Sepsis Labs: No results for input(s): PROCALCITON, LATICACIDVEN in the last 168 hours.  Recent Results (from the past 240 hour(s))  SARS Coronavirus 2 [facility] order, Performed in [facility] hospital lab) Nasopharyngeal Nasopharyngeal Swab  Status: None   Collection Time: 09/05/18  6:31 PM   Specimen: Nasopharyngeal Swab  Result Value Ref Range Status   SARS Coronavirus 2 NEGATIVE NEGATIVE Final    Comment: (NOTE) If result is NEGATIVE SARS-CoV-2 target nucleic acids are NOT DETECTED. The SARS-CoV-2 RNA is generally detectable in upper and lower  respiratory specimens during the acute phase of infection. The lowest  concentration of SARS-CoV-2 viral copies this assay can detect is 250  copies / mL. A negative result does not preclude SARS-CoV-2 infection  and should not be used as the sole basis for treatment or other  patient management decisions.  A negative result may occur with  improper specimen collection / handling, submission of specimen other  than nasopharyngeal swab, presence of viral mutation(s) within the  areas targeted by this assay, and inadequate number of viral copies  (<250 copies / mL). A negative result must be combined with clinical  observations, patient history, and epidemiological information. If result is POSITIVE SARS-CoV-2 target nucleic acids are DETECTED. The SARS-CoV-2 RNA is generally detectable in upper and lower  respiratory specimens dur ing the acute phase of infection.  Positive  results are indicative of active infection with SARS-CoV-2.  Clinical  correlation with patient history and other diagnostic information is  necessary to determine patient infection status.  Positive results do  not rule out bacterial infection or co-infection with other viruses. If result is PRESUMPTIVE POSTIVE SARS-CoV-2 nucleic acids MAY BE PRESENT.   A presumptive positive result was obtained on the submitted specimen  and  confirmed on repeat testing.  While 2019 novel coronavirus  (SARS-CoV-2) nucleic acids may be present in the submitted sample  additional confirmatory testing may be necessary for epidemiological  and / or clinical management purposes  to differentiate between  SARS-CoV-2 and other Sarbecovirus currently known to infect humans.  If clinically indicated additional testing with an alternate test  methodology [phone]) is advised. The SARS-CoV-2 RNA is generally  detectable in upper and lower respiratory sp ecimens during the acute  phase of infection. The expected result is Negative. Fact Sheet for Patients:  StrictlyIdeas.no Fact Sheet for Healthcare Providers: BankingDealers.co.za This test is not yet approved or cleared by the [place] FDA and has been authorized for detection and/or diagnosis of SARS-CoV-2 by FDA under an Emergency Use Authorization (EUA).  This EUA will remain in effect (meaning this test can be used) for the duration of the COVID-19 declaration under Section 564(b)(1) of the Act, 21 U.S.C. section 360bbb-3(b)(1), unless the authorization is terminated or revoked sooner. Performed at [facility], [name] [address], [place], [place] [address]   MRSA PCR Screening     Status: None   Collection Time: 09/05/18  9:34 PM   Specimen: Nasal Mucosa; Nasopharyngeal  Result Value Ref Range Status   MRSA by PCR NEGATIVE NEGATIVE Final    Comment:        The GeneXpert MRSA Assay (FDA approved for NASAL specimens only), is one component of a comprehensive MRSA colonization surveillance program. It is not intended to diagnose MRSA infection nor to guide or monitor treatment for MRSA infections. Performed at [facility], [place] [address], [place],  [address]       Radiology Studies: No results found.    Scheduled Meds: . allopurinol  150 mg Oral Daily  . atenolol  12.5  mg Oral Daily  . Chlorhexidine Gluconate Cloth  6 each Topical Daily  . finasteride  5  mg Oral Daily  . insulin aspart  0-9 Units Subcutaneous Q4H  . mouth rinse  15 mL Mouth Rinse BID  . pantoprazole (PROTONIX) IV  40 mg Intravenous Q12H  . tamsulosin  0.8 mg Oral QPC supper   Continuous Infusions:   LOS: 1 day      Time spent: 25 minutes   Gerald Phi, DO [facility] [email or web address] 09/07/2018, 11:44 AM

## 2018-09-07 NOTE — Progress Notes (Signed)
Patient taken to ICU from procedure room for recovery per Dr. Smith Robert.  Report given to Shirlean Mylar, RN

## 2018-09-07 NOTE — Anesthesia Preprocedure Evaluation (Addendum)
Anesthesia Evaluation  Patient identified by MRN, date of birth, ID band Patient awake    Reviewed: Allergy & Precautions, NPO status , Patient's Chart, lab work & pertinent test results  Airway Mallampati: I  TM Distance: >3 FB Neck ROM: Full    Dental  (+) Teeth Intact, Dental Advisory Given   Pulmonary    breath sounds clear to auscultation       Cardiovascular hypertension, Pt. on home beta blockers and Pt. on medications + CAD and + CABG  + dysrhythmias Atrial Fibrillation + Valvular Problems/Murmurs AS  Rhythm:Regular Rate:Normal + Systolic Click- Systolic murmurs - s/p AVR, CABG   Neuro/Psych CVA negative psych ROS   GI/Hepatic negative GI ROS, Neg liver ROS,   Endo/Other  diabetes  Renal/GU negative Renal ROS     Musculoskeletal negative musculoskeletal ROS (+)   Abdominal Normal abdominal exam  (+)   Peds  Hematology  (+) anemia ,   Anesthesia Other Findings   Reproductive/Obstetrics                            Lab Results  Component Value Date   WBC 5.6 09/07/2018   HGB 8.5 (L) 09/07/2018   HCT 27.0 (L) 09/07/2018   MCV 102.7 (H) 09/07/2018   PLT 165 09/07/2018   Echo: 1. The left ventricle has normal systolic function with an ejection fraction of 60-65%. The cavity size was normal. Left ventricular diastolic Doppler parameters are indeterminate.  2. Left atrial size was severely dilated.  3. Right atrial size was mildly dilated.  4. Mitral valve repair. Moderate mitral valve stenosis.  5. The tricuspid valve is tricuspid valve annuloplasty ring present.  6. Aortic valve regurgitation is trivial by color flow Doppler.  7. The pulmonic valve was normal in structure. Pulmonic valve regurgitation is mild by color flow Doppler.  8. There is mild dilatation of the ascending aorta measuring 37 mm.  EKG: junctional rythym.   Anesthesia Physical Anesthesia Plan  ASA:  III  Anesthesia Plan: MAC   Post-op Pain Management:    Induction: Intravenous  PONV Risk Score and Plan: Propofol infusion and Ondansetron  Airway Management Planned: Natural Airway and Nasal Cannula  Additional Equipment: None  Intra-op Plan:   Post-operative Plan:   Informed Consent: I have reviewed the patients History and Physical, chart, labs and discussed the procedure including the risks, benefits and alternatives for the proposed anesthesia with the patient or authorized representative who has indicated his/her understanding and acceptance.       Plan Discussed with: CRNA  Anesthesia Plan Comments:        Anesthesia Quick Evaluation

## 2018-09-07 NOTE — Progress Notes (Signed)
Report given to [facility], pt transferred in wheelchair on telemetry.

## 2018-09-08 ENCOUNTER — Encounter (HOSPITAL_COMMUNITY): Payer: Self-pay | Admitting: Gastroenterology

## 2018-09-08 LAB — PROTIME-INR
INR: 1.5 — ABNORMAL HIGH (ref 0.8–1.2)
Prothrombin Time: 18.3 seconds — ABNORMAL HIGH (ref 11.4–15.2)

## 2018-09-08 LAB — CBC
HCT: 25.2 % — ABNORMAL LOW (ref 39.0–52.0)
Hemoglobin: 8.2 g/dL — ABNORMAL LOW (ref 13.0–17.0)
MCH: 32.8 pg (ref 26.0–34.0)
MCHC: 32.5 g/dL (ref 30.0–36.0)
MCV: 100.8 fL — ABNORMAL HIGH (ref 80.0–100.0)
Platelets: 164 10*3/uL (ref 150–400)
RBC: 2.5 MIL/uL — ABNORMAL LOW (ref 4.22–5.81)
RDW: 14 % (ref 11.5–15.5)
WBC: 5.6 10*3/uL (ref 4.0–10.5)
nRBC: 0 % (ref 0.0–0.2)

## 2018-09-08 LAB — GLUCOSE, CAPILLARY: Glucose-Capillary: 108 mg/dL — ABNORMAL HIGH (ref 70–99)

## 2018-09-08 MED ORDER — ASPIRIN 81 MG PO TBEC: 81.0000 mg | DELAYED_RELEASE_TABLET | Freq: Every day | ORAL | 12 refills | Status: DC

## 2018-09-08 MED ORDER — PANTOPRAZOLE SODIUM 40 MG PO TBEC: 40.0000 mg | DELAYED_RELEASE_TABLET | Freq: Every day | ORAL | Status: DC

## 2018-09-08 MED ORDER — PANTOPRAZOLE SODIUM 40 MG PO TBEC: 40.0000 mg | DELAYED_RELEASE_TABLET | Freq: Every day | ORAL | 2 refills | Status: DC

## 2018-09-08 NOTE — Discharge Summary (Signed)
Physician Discharge Summary  [name]. [id number] DOB: 10-20-1935 DOA: 09/05/2018  PCP: Leighton Ruff, MD  Admit date: 09/05/2018 Discharge date: 09/08/2018  Admitted From: Home Disposition:  Home  Recommendations for Outpatient Follow-up:  1. Follow up with PCP this week for follow-up CBC and Hemoccult check.  GI has recommended serial hemoglobin and Hemoccult check a month or 2 post discharge, if patient remains persistently anemic or persistent heme positive post discharge, patient may need colonoscopy 2. Follow up with [facility] for INR check this week  3. Continue PPI, lifelong GI prophylaxis as long as patient remains on aspirin and Coumadin 4. Follow-up with [facility] as an outpatient, as-needed basis for persistent anemia or persistent heme positive stool 5. Please follow-up for gastric biopsy result  Discharge Condition: Stable CODE STATUS: Full code Diet recommendation: Heart healthy  Brief/Interim Summary: [name]. is a 83 yo male past medical history significant for diverticulosis/diverticulitis, adenomatous polyp, aortic valve replacement, status post bioprosthetic valve in 2012, hypertension, chronic atrial fibrillation on Coumadin, previous embolic stroke in [id number] status post PFO repair, diabetes who presents to the hospital with chief complaint of lightheadedness, presyncope for the past week.  Outpatient blood work revealed anemia from hemoglobin 12.3-->9.4.  Patient denied any nausea, vomiting, abdominal pain, bright red blood per rectum.  He thought his stools might be a little bit darker than usual.  Patient was admitted for symptomatic anemia secondary to suspected GI blood loss.  Patient was evaluated by GI.  He underwent EGD on 8/30 which found gastritis.  Hemoglobin was trended, patient did not require blood transfusion during hospitalization.  Coumadin resumed, patient stable for discharge home.  He should be followed up closely with serial  hemoglobin and Hemoccult checks.  Discharge Diagnoses:  Principal Problem:   Symptomatic anemia Active Problems:   Essential hypertension   Gout   History of BPH   S/P AVR   S/P AVR (aortic valve replacement)   Diabetes mellitus type 2 in nonobese (HCC)   Persistent atrial fibrillation   History of ischemic left MCA stroke   S/P mitral valve repair    S/P tricuspid valve repair   S/P Maze operation for atrial fibrillation   Long term (current) use of anticoagulants   Anemia due to GI blood loss   GI bleed    Symptomatic anemia, secondary to GI blood loss -GI following, EGD 8/30.  Found gastritis, Candida esophagitis -Continue to monitor CBC, INR  -IV PPI  --> p.o. PPI -Hemoglobin stable this morning 8.2  Persistent atrial fibrillation -CHA2DS2-VASc score 6 -Resume Coumadin  Essential hypertension -Continue atenolol  Type 2 diabetes, well controlled -Ha1c 5.1 -Sliding-scale insulin while inpatient   BPH -Continue proscar    Discharge Instructions  Discharge Instructions    Call MD for:  difficulty breathing, headache or visual disturbances   Complete by: As directed    Call MD for:  extreme fatigue   Complete by: As directed    Call MD for:  persistant dizziness or light-headedness   Complete by: As directed    Call MD for:  persistant nausea and vomiting   Complete by: As directed    Call MD for:  severe uncontrolled pain   Complete by: As directed    Call MD for:  temperature >100.4   Complete by: As directed    Diet - low sodium heart healthy   Complete by: As directed    Discharge instructions   Complete by: As directed  You were cared for by a hospitalist during your hospital stay. If you have any questions about your discharge medications or the care you received while you were in the hospital after you are discharged, you can call the unit and ask to speak with the hospitalist on call if the hospitalist that took care of you is not available.  Once you are discharged, your primary care physician will handle any further medical issues. Please note that NO REFILLS for any discharge medications will be authorized once you are discharged, as it is imperative that you return to your primary care physician (or establish a relationship with a primary care physician if you do not have one) for your aftercare needs so that they can reassess your need for medications and monitor your lab values.   Increase activity slowly   Complete by: As directed      Allergies as of 09/08/2018      Reactions   Sulfa Antibiotics Other (See Comments)   Granulocytosis      Medication List    TAKE these medications   allopurinol 300 MG tablet Commonly known as: ZYLOPRIM Take 150 mg by mouth daily.   amoxicillin 500 MG capsule Commonly known as: AMOXIL Take 2,000 mg by mouth See admin instructions. Take 2000 mg by mouth 1 hour prior to dental appointment   aspirin 81 MG EC tablet Take 1 tablet (81 mg total) by mouth daily. Hold for 1 week, may resume 9/7 or as otherwise instructed by your PCP Start taking on: September 15, 2018 What changed:   additional instructions  These instructions start on September 15, 2018. If you are unsure what to do until then, ask your doctor or other care provider.   atenolol 25 MG tablet Commonly known as: TENORMIN Take 0.5 tablets (12.5 mg total) by mouth 2 (two) times daily. What changed: when to take this   b complex vitamins tablet Take 1 tablet by mouth daily.   diphenhydrAMINE 2 % cream Commonly known as: BENADRYL Apply 1 application topically daily as needed for itching.   finasteride 5 MG tablet Commonly known as: PROSCAR Take 1 tablet (5 mg total) by mouth daily.   lisinopril 5 MG tablet Commonly known as: ZESTRIL Take 5 mg by mouth daily.   pantoprazole 40 MG tablet Commonly known as: PROTONIX Take 1 tablet (40 mg total) by mouth daily.   tamsulosin 0.4 MG Caps capsule Commonly known as:  FLOMAX Take 2 capsules (0.8 mg total) by mouth daily after breakfast. What changed: when to take this   traZODone 50 MG tablet Commonly known as: DESYREL Take 50 mg by mouth at bedtime as needed for sleep.   warfarin 5 MG tablet Commonly known as: COUMADIN Take as directed. If you are unsure how to take this medication, talk to your nurse or doctor. Original instructions: Take 1 tablet daily except 1.5 tablets on Monday and Friday or as directed By Anticoagulation Clinic.      Follow-up Information    Leighton Ruff, MD. Schedule an appointment as soon as possible for a visit in 1 week(s).   Specialty: Family Medicine Why: Follow up with PCP this week for follow-up CBC and Hemoccult check.  GI has recommended serial hemoglobin and Hemoccult check in 1-32months as well. If remains persistently anemic or heme positive post discharge, patient may need colonoscopy Contact information: [place] [address] [phone]        [facility] Follow up.  Specialty: Cardiology Why: For coumadin/INR check  Contact information: [address], [place]       Otis Brace, MD Follow up.   Specialty: Gastroenterology Why: Follow up on as needed basis  Contact information: [place] [address] [phone]          Allergies  Allergen Reactions  . Sulfa Antibiotics Other (See Comments)    Granulocytosis    Consultations:  GI   Procedures/Studies: No results found.  EGD 8/30 Impression:   - Z-line regular, 40 cm from the incisors. - Esophageal plaques were found, suspicious for  candidiasis. Biopsied. - Gastritis with hemorrhage. Biopsied. - Normal duodenal bulb, first portion of the   duodenum and second  portion of the duodenum.    Discharge Exam: Vitals:   09/08/18 0538 09/08/18 0921  BP: 135/76   Pulse: (!) 59 62  Resp: 18   Temp: 98.6 F (37 C)   SpO2: 96%      General: Pt is alert, awake, not in acute distress Cardiovascular: RRR, S1/S2 +, no edema Respiratory: CTA bilaterally, no wheezing, no rhonchi, no respiratory distress, no conversational dyspnea  Abdominal: Soft, NT, ND, bowel sounds + Extremities: no edema, no cyanosis Psych: Normal mood and affect, stable judgement and insight     The results of significant diagnostics from this hospitalization (including imaging, microbiology, ancillary and laboratory) are listed below for reference.     Microbiology: Recent Results (from the past 240 hour(s))  SARS Coronavirus 2 [facility] order, Performed in [facility] hospital lab) Nasopharyngeal Nasopharyngeal Swab     Status: None   Collection Time: 09/05/18  6:31 PM   Specimen: Nasopharyngeal Swab  Result Value Ref Range Status   SARS Coronavirus 2 NEGATIVE NEGATIVE Final    Comment: (NOTE) If result is NEGATIVE SARS-CoV-2 target nucleic acids are NOT DETECTED. The SARS-CoV-2 RNA is generally detectable in upper and lower  respiratory specimens during the acute phase of infection. The lowest  concentration of SARS-CoV-2 viral copies this assay can detect is 250  copies / mL. A negative result does not preclude SARS-CoV-2 infection  and should not be used as the sole basis for treatment or other  patient management decisions.  A negative result may occur with  improper specimen collection / handling, submission of specimen other  than nasopharyngeal swab, presence of viral mutation(s) within the  areas targeted by this assay, and inadequate number of viral copies  (<250 copies / mL). A negative result must be combined with clinical  observations, patient history, and epidemiological information. If result is POSITIVE SARS-CoV-2 target nucleic acids are  DETECTED. The SARS-CoV-2 RNA is generally detectable in upper and lower  respiratory specimens dur ing the acute phase of infection.  Positive  results are indicative of active infection with SARS-CoV-2.  Clinical  correlation with patient history and other diagnostic information is  necessary to determine patient infection status.  Positive results do  not rule out bacterial infection or co-infection with other viruses. If result is PRESUMPTIVE POSTIVE SARS-CoV-2 nucleic acids MAY BE PRESENT.   A presumptive positive result was obtained on the submitted specimen  and confirmed on repeat testing.  While 2019 novel coronavirus  (SARS-CoV-2) nucleic acids may be present in the submitted sample  additional confirmatory testing may be necessary for epidemiological  and / or clinical management purposes  to differentiate between  SARS-CoV-2 and other Sarbecovirus currently known to infect humans.  If clinically indicated additional testing with an alternate test  methodology [phone]) is advised. The SARS-CoV-2 RNA is generally  detectable in upper and lower respiratory sp ecimens during the acute  phase of infection. The expected result is Negative. Fact Sheet for Patients:  StrictlyIdeas.no Fact Sheet for Healthcare Providers: BankingDealers.co.za This test is not yet approved or cleared by the [place] FDA and has been authorized for detection and/or diagnosis of SARS-CoV-2 by FDA under an Emergency Use Authorization (EUA).  This EUA will remain in effect (meaning this test can be used) for the duration of the COVID-19 declaration under Section 564(b)(1) of the Act, 21 U.S.C. section 360bbb-3(b)(1), unless the authorization is terminated or revoked sooner. Performed at [facility], [place] [address] [place], [place] [address]   MRSA PCR Screening     Status: None   Collection Time: 09/05/18  9:34 PM   Specimen:  Nasal Mucosa; Nasopharyngeal  Result Value Ref Range Status   MRSA by PCR NEGATIVE NEGATIVE Final    Comment:        The GeneXpert MRSA Assay (FDA approved for NASAL specimens only), is one component of a comprehensive MRSA colonization surveillance program. It is not intended to diagnose MRSA infection nor to guide or monitor treatment for MRSA infections. Performed at [facility], [place] [address] [place], [place] [address]      Labs: BNP (last 3 results) No results for input(s): BNP in the last 8760 hours. Basic Metabolic Panel: Recent Labs  Lab 09/05/18 1455 09/06/18 0500 09/07/18 0500  NA 141 140 139  K 4.7 3.8 4.1  CL 108 110 109  CO2 25 21* 24  GLUCOSE 116* 85 109*  BUN 21 15 15   CREATININE 1.04 0.77 0.95  CALCIUM 9.0 7.7* 8.4*  MG  --  2.1  --   PHOS  --  3.2  --    Liver Function Tests: Recent Labs  Lab 09/05/18 1455 09/06/18 0500  AST 24 24  ALT 17 14  ALKPHOS 57 41  BILITOT 0.7 0.7  PROT 7.6 5.8*  ALBUMIN 4.3 3.3*   No results for input(s): LIPASE, AMYLASE in the last 168 hours. No results for input(s): AMMONIA in the last 168 hours. CBC: Recent Labs  Lab 09/06/18 1000 09/06/18 1600 09/06/18 2212 09/07/18 0400 09/08/18 0411  WBC 5.8 4.9 5.7 5.6 5.6  HGB 8.4* 8.1* 8.1* 8.5* 8.2*  HCT 26.0* 25.5* 24.9* 27.0* 25.2*  MCV 102.4* 102.8* 102.0* 102.7* 100.8*  PLT 157 167 151 165 164   Cardiac Enzymes: No results for input(s): CKTOTAL, CKMB, CKMBINDEX, TROPONINI in the last 168 hours. BNP: Invalid input(s): POCBNP CBG: Recent Labs  Lab 09/07/18 0322 09/07/18 0746 09/07/18 1114 09/07/18 1618 09/08/18 0636  GLUCAP 91 113* 133* 122* 108*   D-Dimer No results for input(s): DDIMER in the last 72 hours. Hgb A1c Recent Labs    09/06/18 0500  HGBA1C 5.1   Lipid Profile No results for input(s): CHOL, HDL, LDLCALC, TRIG, CHOLHDL, LDLDIRECT in the last 72 hours. Thyroid function studies Recent Labs    09/06/18 0500   TSH 2.411   Anemia work up Recent Labs    09/05/18 1455  VITAMINB12 552  FOLATE 24.1  FERRITIN 19*  TIBC 368  IRON 43*  RETICCTPCT 3.9*   Urinalysis    Component Value Date/Time   COLORURINE YELLOW 01/20/2018 1230   APPEARANCEUR HAZY (A) 01/20/2018 1230   LABSPEC 1.014 01/20/2018 1230   PHURINE 5.0 01/20/2018  [place] 01/20/2018 1230   HGBUR SMALL (A) 01/20/2018 1230   BILIRUBINUR NEGATIVE 01/20/2018 1230   KETONESUR NEGATIVE 01/20/2018 1230   PROTEINUR NEGATIVE 01/20/2018 1230   UROBILINOGEN 0.2 08/01/2010 0924   NITRITE POSITIVE (A) 01/20/2018 1230   LEUKOCYTESUR TRACE (A) 01/20/2018 1230   Sepsis Labs Invalid input(s): PROCALCITONIN,  WBC,  LACTICIDVEN Microbiology Recent Results (from the past 240 hour(s))  SARS Coronavirus 2 [facility] order, Performed in [facility] hospital lab) Nasopharyngeal Nasopharyngeal Swab     Status: None   Collection Time: 09/05/18  6:31 PM   Specimen: Nasopharyngeal Swab  Result Value Ref Range Status   SARS Coronavirus 2 NEGATIVE NEGATIVE Final    Comment: (NOTE) If result is NEGATIVE SARS-CoV-2 target nucleic acids are NOT DETECTED. The SARS-CoV-2 RNA is generally detectable in upper and lower  respiratory specimens during the acute phase of infection. The lowest  concentration of SARS-CoV-2 viral copies this assay can detect is 250  copies / mL. A negative result does not preclude SARS-CoV-2 infection  and should not be used as the sole basis for treatment or other  patient management decisions.  A negative result may occur with  improper specimen collection / handling, submission of specimen other  than nasopharyngeal swab, presence of viral mutation(s) within the  areas targeted by this assay, and inadequate number of viral copies  (<250 copies / mL). A negative result must be combined with clinical  observations, patient history, and epidemiological information. If result is POSITIVE SARS-CoV-2 target  nucleic acids are DETECTED. The SARS-CoV-2 RNA is generally detectable in upper and lower  respiratory specimens dur ing the acute phase of infection.  Positive  results are indicative of active infection with SARS-CoV-2.  Clinical  correlation with patient history and other diagnostic information is  necessary to determine patient infection status.  Positive results do  not rule out bacterial infection or co-infection with other viruses. If result is PRESUMPTIVE POSTIVE SARS-CoV-2 nucleic acids MAY BE PRESENT.   A presumptive positive result was obtained on the submitted specimen  and confirmed on repeat testing.  While 2019 novel coronavirus  (SARS-CoV-2) nucleic acids may be present in the submitted sample  additional confirmatory testing may be necessary for epidemiological  and / or clinical management purposes  to differentiate between  SARS-CoV-2 and other Sarbecovirus currently known to infect humans.  If clinically indicated additional testing with an alternate test  methodology [phone]) is advised. The SARS-CoV-2 RNA is generally  detectable in upper and lower respiratory sp ecimens during the acute  phase of infection. The expected result is Negative. Fact Sheet for Patients:  StrictlyIdeas.no Fact Sheet for Healthcare Providers: BankingDealers.co.za This test is not yet approved or cleared by the [place] FDA and has been authorized for detection and/or diagnosis of SARS-CoV-2 by FDA under an Emergency Use Authorization (EUA).  This EUA will remain in effect (meaning this test can be used) for the duration of the COVID-19 declaration under Section 564(b)(1) of the Act, 21 U.S.C. section 360bbb-3(b)(1), unless the authorization is terminated or revoked sooner. Performed at [facility], [place] [address], [place], [place] [address]   MRSA PCR Screening     Status: None   Collection Time: 09/05/18  9:34  PM   Specimen: Nasal Mucosa; Nasopharyngeal  Result Value Ref Range Status   MRSA by PCR NEGATIVE NEGATIVE Final    Comment:        The GeneXpert MRSA Assay (FDA approved  for NASAL specimens only), is one component of a comprehensive MRSA colonization surveillance program. It is not intended to diagnose MRSA infection nor to guide or monitor treatment for MRSA infections. Performed at [facility], [place] [address], [place], [place] [address]      Patient was seen and examined on the day of discharge and was found to be in stable condition. Time coordinating discharge: 25 minutes including assessment and coordination of care, as well as examination of the patient.   SIGNED:  Dessa Phi, DO [facility] [email or web address] 09/08/2018, 11:03 AM

## 2018-09-08 NOTE — Progress Notes (Signed)
Patient has discharged to home on 09/08/2018. Discharge instructions including medications and appointments was given to patient. Patient has no questions at this time.

## 2018-09-08 NOTE — Progress Notes (Addendum)
Hgb stable.  Has been walking in hall without dizziness.  Last BM was last night and, per pt and RN, apparently no visible blood.  IMPR:  Quiescent GIB of indeterminate origin.  The absence of gross bloody BM's noted by pt prior to admission would go against a diverticular bleed (and bld was dark at time of admission), but nl BUN on admission and absence of findings on egd yesterday would go against an upper tract source   RECOMMENDATIONS:  1.  Ok for dischg from GI standpoint either today or tomorrow  (in the meantime I have ordered solid diet and converted PPI to oral) 2.. No dietary restrictions; pt advised to advance activity slowly 3.  Would hold ASA for 1 week 4.  Would recomm f/u w/ PCP for 3-5 days post dischg for hgb and hemoccult check 5.  Pt should have serial hgb and hemoccult check a month or two post dischg; if he remains anemic or persistently heme positive post dischg, may need colonoscopy 6.  Knowing it would take several days for pt's INR to go out, I think it'd be reasonable to resume warfarin today or tomorrow; whether to "bridge" w/ Lovenox would depend on perceived importance of pt being anticoagulated as soon as possible (eg, what is his risk of stroke) 7.  Given uncertainty of origin of pt's bleed (???whether he bled from an upper tract source that had resolved by time of egd), would recommend keeping this pt on lifelong PPI prophylaxis as long as he remains on ASA and warfarin 8.  GI f/u not necessary unless pt is found to be persistently anemic or heme positive by his PCP as described above.  Please call if questions.  Cleotis Nipper, M.D. Pager [phone] If no answer or after 5 PM call [phone]  Time for today's visit, including counselling/education for patient and coordinating f/u care, would approximately 25 mintues.

## 2018-09-09 ENCOUNTER — Encounter: Payer: Self-pay | Admitting: Family Medicine

## 2018-09-22 ENCOUNTER — Telehealth: Payer: Self-pay | Admitting: *Deleted

## 2018-09-22 ENCOUNTER — Ambulatory Visit (INDEPENDENT_AMBULATORY_CARE_PROVIDER_SITE_OTHER): Payer: Medicare Other | Admitting: *Deleted

## 2018-09-22 ENCOUNTER — Other Ambulatory Visit: Payer: Self-pay

## 2018-09-22 DIAGNOSIS — Z7901 Long term (current) use of anticoagulants: Secondary | ICD-10-CM

## 2018-09-22 DIAGNOSIS — I4811 Longstanding persistent atrial fibrillation: Secondary | ICD-10-CM

## 2018-09-22 DIAGNOSIS — Z9889 Other specified postprocedural states: Secondary | ICD-10-CM | POA: Diagnosis not present

## 2018-09-22 DIAGNOSIS — I63411 Cerebral infarction due to embolism of right middle cerebral artery: Secondary | ICD-10-CM

## 2018-09-22 LAB — POCT INR: INR: 2.3 (ref 2.0–3.0)

## 2018-09-22 NOTE — Telephone Encounter (Signed)
Noted  

## 2018-09-22 NOTE — Telephone Encounter (Signed)
No vm.  Called pt to discuss note pt sent into Truitt Merle, NP.  Cecille Rubin stated pt does not have to come in for a visit unless pt wants to.   Cecille Rubin wanted to know if everything was ok and if pt is back on coumadin. Pt can keep appt in November unless pt wants to be seen sooner.

## 2018-09-22 NOTE — Patient Instructions (Signed)
Description   Continue on same dosage 1 tablet daily except 1.5 tablets on Mondays and Fridays.  Recheck in 6 weeks. Coumadin Clinic [phone] call with bleeding problems or new medications.       

## 2018-09-22 NOTE — Telephone Encounter (Signed)
S/w pt is back on coumadin.  Stated can feel is hgb is on the lower side due to getting winded when walking.  Stated did not want to see Cecille Rubin back sooner unless colonoscopy shows something or not show something.  Than pt would liked to be referred to a hemotologist is this is something Cecille Rubin can do.  Stated to call back if needed after further testing.  Will send to [place] to [place].

## 2018-09-29 ENCOUNTER — Telehealth: Payer: Self-pay | Admitting: *Deleted

## 2018-09-29 ENCOUNTER — Other Ambulatory Visit: Payer: Self-pay | Admitting: Gastroenterology

## 2018-09-29 NOTE — Telephone Encounter (Signed)
   [facility] Pre-operative Risk Assessment    Request for surgical clearance:  1. What type of surgery is being performed? COLONOSCOPY   2. When is this surgery scheduled? 10/21/18   3. What type of clearance is required (medical clearance vs. Pharmacy clearance to hold med vs. Both)? BOTH  4. Are there any medications that need to be held prior to surgery and how long? WARFARIN AND ASA   5. Practice name and name of physician performing surgery? [facility]; DR. Alessandra Bevels   6. What is your office phone number [phone]    7.   What is your office fax number [phone]  8.   Anesthesia type (None, local, MAC, general) ? PROPOFOL   Julaine Hua 09/29/2018, 1:31 PM  _________________________________________________________________   (provider comments below)

## 2018-09-29 NOTE — Telephone Encounter (Addendum)
Patient with diagnosis of afib and embolic stroke on warfarin for anticoagulation.    Procedure: colonoscopy Date of procedure: 10/21/18  CHADS2-VASc score of 6 (HTN, AGEx2, DM2, strokex2)  CrCl ~60-65 mL/min Platelet count 164  Per office protocol, patient can hold warfarin for 5 days prior to procedure.    Patient will need bridging with Lovenox (enoxaparin) around procedure.  Patient called and made aware of the need for bridge. His appointment has been moved to accommodate this. Will coordinate at next visit.

## 2018-10-14 ENCOUNTER — Ambulatory Visit (INDEPENDENT_AMBULATORY_CARE_PROVIDER_SITE_OTHER): Payer: Medicare Other | Admitting: *Deleted

## 2018-10-14 ENCOUNTER — Other Ambulatory Visit: Payer: Self-pay | Admitting: Nurse Practitioner

## 2018-10-14 ENCOUNTER — Other Ambulatory Visit: Payer: Self-pay

## 2018-10-14 ENCOUNTER — Telehealth: Payer: Self-pay | Admitting: Pharmacist

## 2018-10-14 DIAGNOSIS — I63411 Cerebral infarction due to embolism of right middle cerebral artery: Secondary | ICD-10-CM | POA: Diagnosis not present

## 2018-10-14 DIAGNOSIS — Z7901 Long term (current) use of anticoagulants: Secondary | ICD-10-CM

## 2018-10-14 DIAGNOSIS — I4811 Longstanding persistent atrial fibrillation: Secondary | ICD-10-CM

## 2018-10-14 DIAGNOSIS — Z9889 Other specified postprocedural states: Secondary | ICD-10-CM

## 2018-10-14 LAB — POCT INR: INR: 3.9 — AB (ref 2.0–3.0)

## 2018-10-14 MED ORDER — ENOXAPARIN SODIUM 120 MG/0.8ML ~~LOC~~ SOLN: 120.0000 mg | SUBCUTANEOUS | 1 refills | Status: DC

## 2018-10-14 NOTE — Patient Instructions (Addendum)
10/5: Last dose of Coumadin.  10/6: No Coumadin or Lovenox.  10/7: No Coumadin or Lovenox.  10/8: Inject Lovenox 120 mg in the fatty abdominal tissue at least 2 inches from the belly button once daily at 8am. No Coumadin.  10/9: Inject Lovenox in the fatty tissue at 8am. No Coumadin.  10/10: Inject Lovenox in the fatty tissue at 8am. No Coumadin.  10/11: Inject Lovenox in the fatty tissue at 8am. No Coumadin.  10/12: Inject Lovenox in the fatty tissue at 8 am. No Coumadin.  10/13: Procedure Day - No Lovenox - Resume Coumadin in the evening or as directed by doctor (take an extra half tablet with usual dose for 2 days then resume normal dose).  10/14: Resume Lovenox inject in the fatty tissue at 8am and take Coumadin.  10/15: Inject Lovenox in the fatty tissue at 8am and take Coumadin.  10/16: Inject Lovenox in the fatty tissue at 8am and take Coumadin.  10/17: Inject Lovenox in the fatty tissue at 8am and take Coumadin.  10/18: Inject Lovenox in the fatty tissue 8am and take Coumadin.  10/19: Coumadin appt to check INR.

## 2018-10-14 NOTE — Telephone Encounter (Signed)
Noticed error in patients lovenox instructions. Called and spoke with patients wife. Advised that patient should take his lovenox at [place] starting Thursday 10/8 not 8AM and that he should take no lovenox or coumadin on 10/12. Updated instructions also sent via mychart.

## 2018-10-17 ENCOUNTER — Other Ambulatory Visit (HOSPITAL_COMMUNITY)
Admission: RE | Admit: 2018-10-17 | Discharge: 2018-10-17 | Disposition: A | Discharge status: Home / Self Care | Payer: Medicare Other | Source: Ambulatory Visit | Attending: Gastroenterology | Admitting: Gastroenterology

## 2018-10-17 ENCOUNTER — Other Ambulatory Visit: Payer: Self-pay

## 2018-10-17 ENCOUNTER — Encounter (HOSPITAL_COMMUNITY): Payer: Self-pay | Admitting: *Deleted

## 2018-10-17 DIAGNOSIS — Z20828 Contact with and (suspected) exposure to other viral communicable diseases: Secondary | ICD-10-CM | POA: Insufficient documentation

## 2018-10-17 DIAGNOSIS — Z01812 Encounter for preprocedural laboratory examination: Secondary | ICD-10-CM | POA: Insufficient documentation

## 2018-10-17 IMAGING — MR MR HEAD W/O CM
8 of 10 series · 35 of 48 positions shown · non-contrast
Comparison: CT head 10/24/2016.

ADDENDUM:
Correction

Small amount of hemorrhage in the right basal ganglia infarct should
read, "trace hemorrhage right basal ganglia infarct"
CLINICAL DATA: Stroke.  Post endovascular clot or cerebral and tPA.
EXAM:
MRI HEAD WITHOUT CONTRAST
TECHNIQUE: Multiplanar, multiecho pulse sequences of the brain and surrounding
structures were obtained without intravenous contrast.

[Series 3: DWI · axial · 3.0mm · 1.09mm/px · z∈[-75,+68]mm · 9 of 98 slices shown (1 of 4)]
[im 1/98]
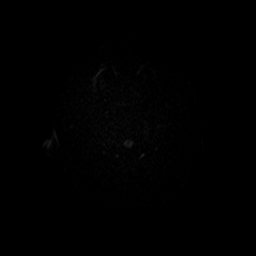
[im 13/98]
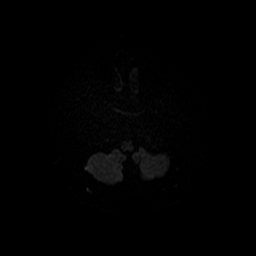
[im 25/98]
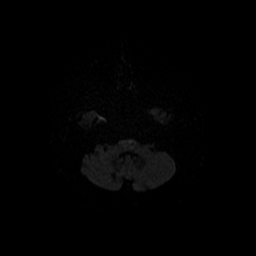
[im 37/98]
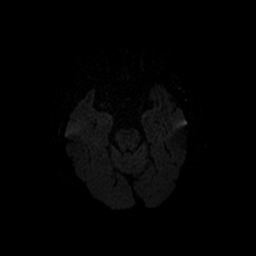
[im 49/98]
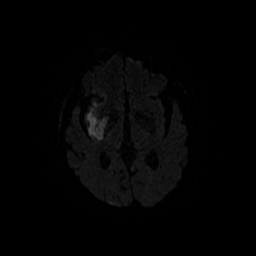
[im 61/98]
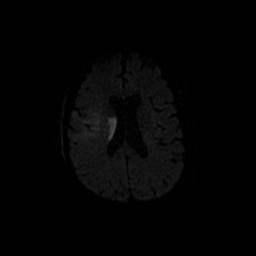
[im 73/98]
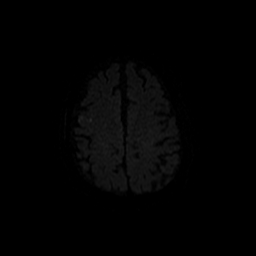
[im 85/98]
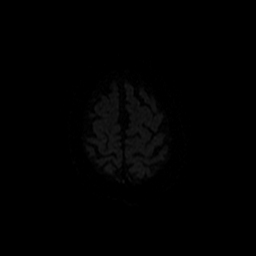
[im 98/98]
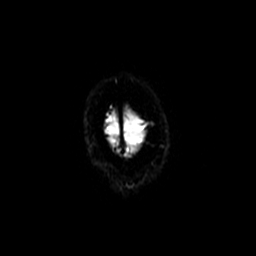

[Series 4: DWI · coronal · 5.0mm · 1.09mm/px · 6 of 70 slices shown (2 of 4)]
[im 1/70]
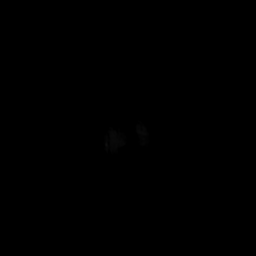
[im 14/70]
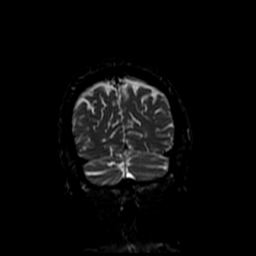
[im 28/70]
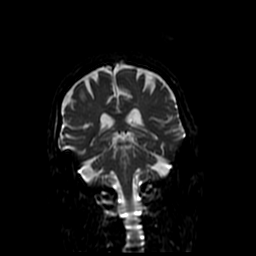
[im 42/70]
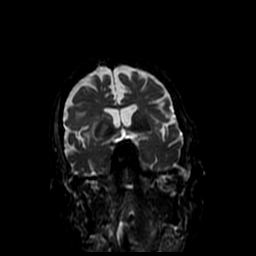
[im 56/70]
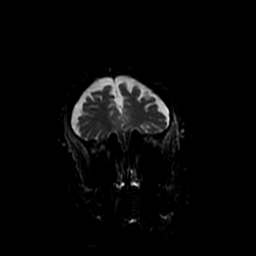
[im 70/70]
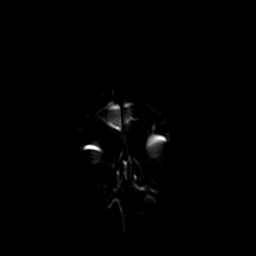

[Series 5: T1 · sagittal · 5.0mm · 0.47mm/px · 3 of 26 slices shown]
[im 1/26]
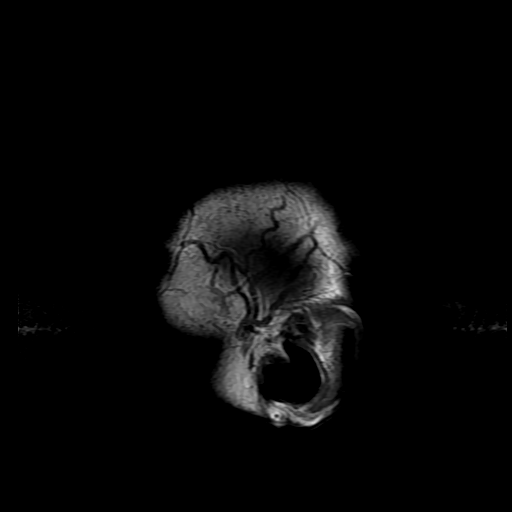
[im 13/26]
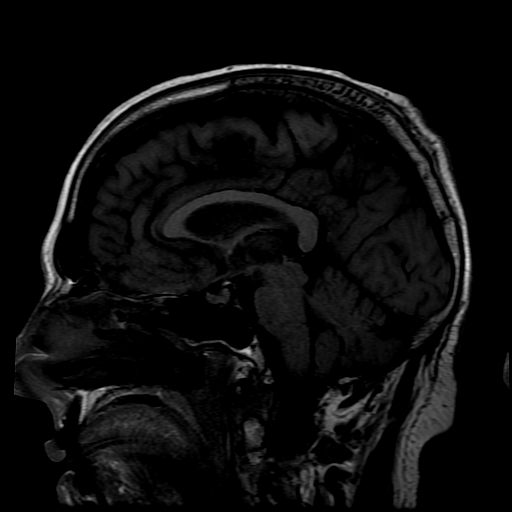
[im 26/26]
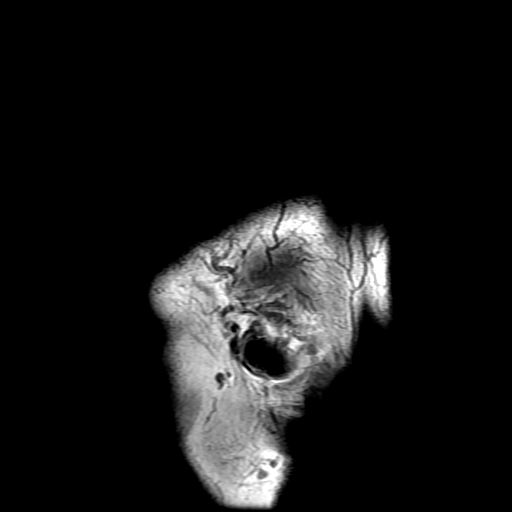

[Series 6: T2 · axial · 5.0mm · 0.45mm/px · z∈[-73,+76]mm · 3 of 26 slices shown (1 of 2)]
[im 1/26]
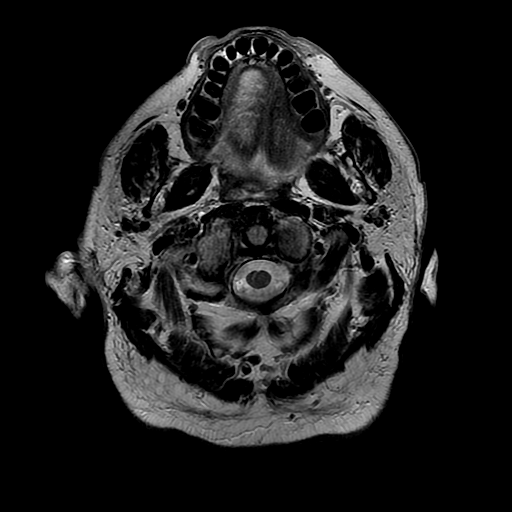
[im 13/26]
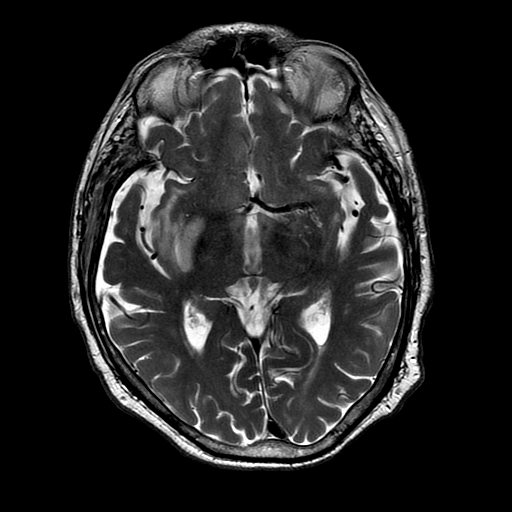
[im 26/26]
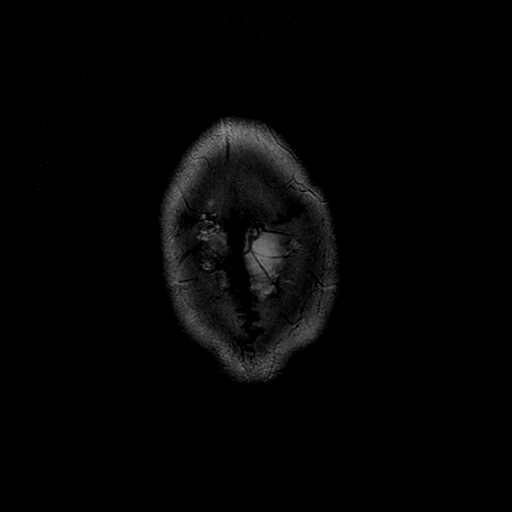

[Series 7: FLAIR · axial · 5.0mm · 0.45mm/px · z∈[-73,+76]mm · 3 of 26 slices shown]
[im 1/26]
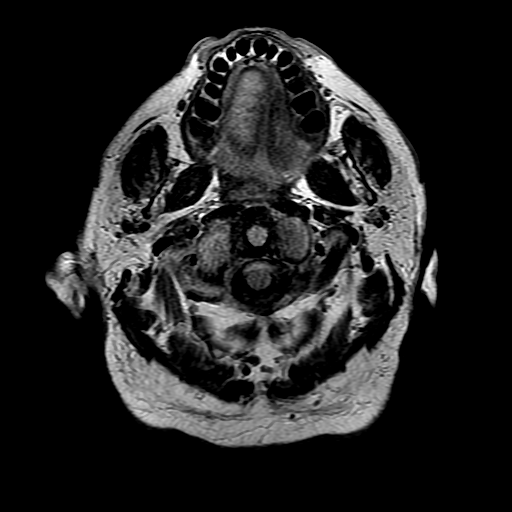
[im 13/26]
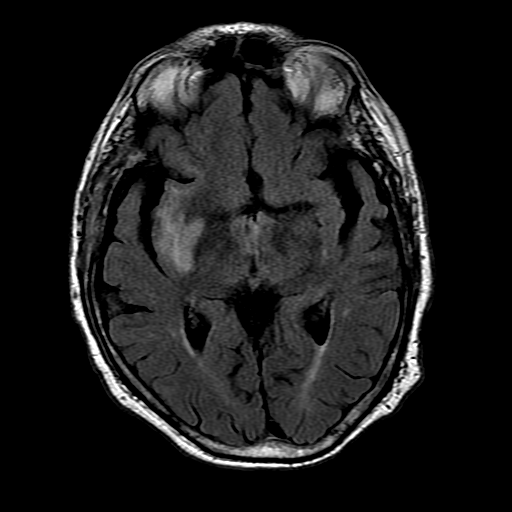
[im 26/26]
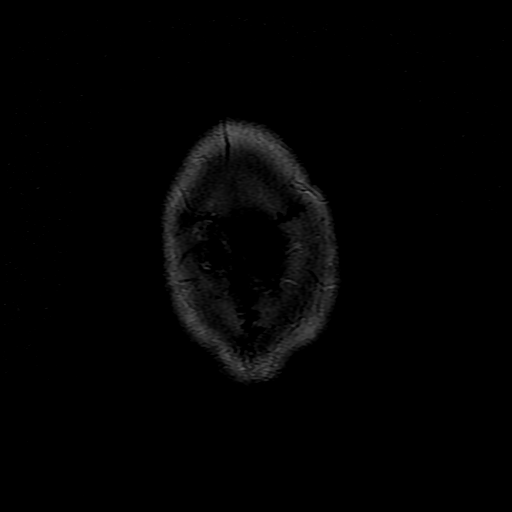

[Series 10: T2 · coronal · 5.0mm · 0.43mm/px · 3 of 28 slices shown (2 of 2)]
[im 1/28]
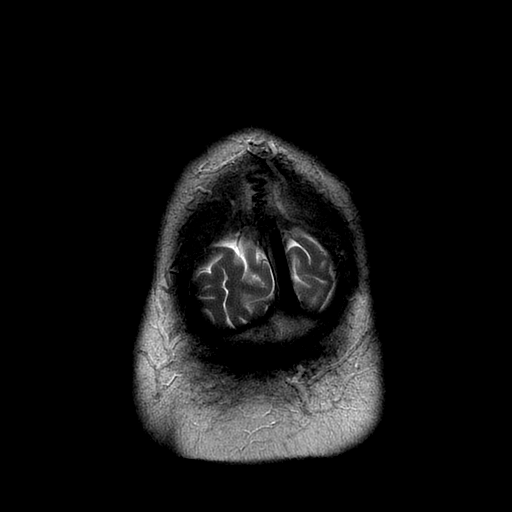
[im 14/28]
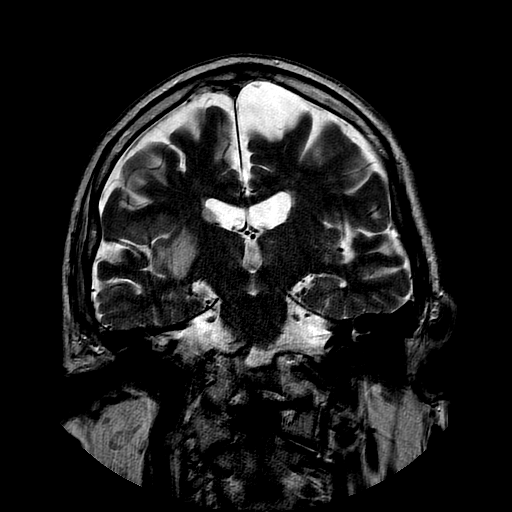
[im 28/28]
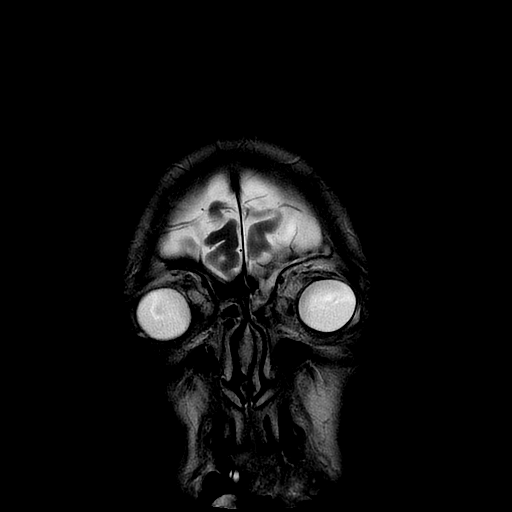

[Series 300: DWI · axial · 3.0mm · 1.09mm/px · z∈[-75,+68]mm · 5 of 49 slices shown (3 of 4)]
[im 1/49]
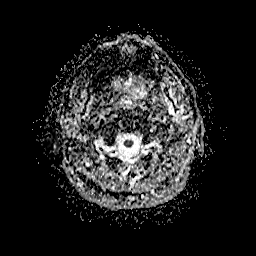
[im 13/49]
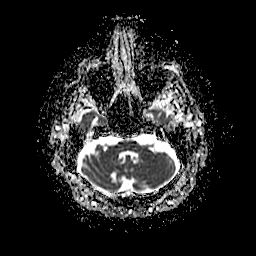
[im 25/49]
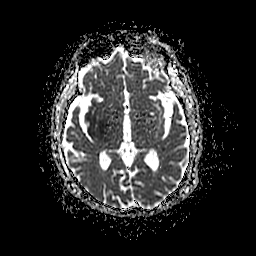
[im 37/49]
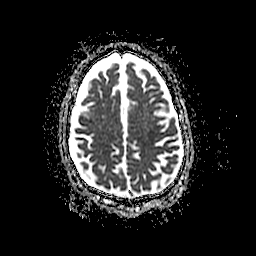
[im 49/49]
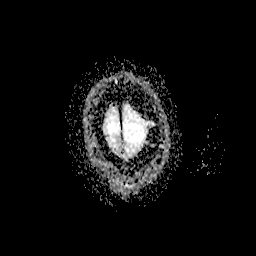

[Series 400: DWI · coronal · 5.0mm · 1.09mm/px · 3 of 35 slices shown (4 of 4)]
[im 1/35]
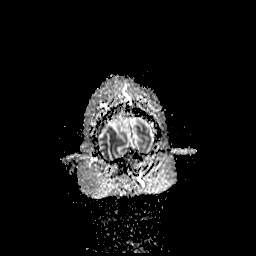
[im 18/35]
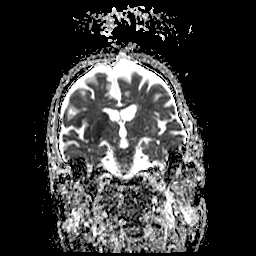
[im 35/35]
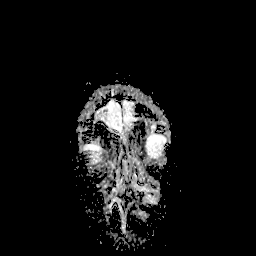

[35 of 48 positions shown; findings below may reference images not displayed]

FINDINGS: Brain: Acute infarct in the right basal ganglia involving the
putamen and body and tail of caudate. Acute infarct in the right
insula. Small areas of acute infarct in the right frontal and
parietal cortex and in the left occipital cortex. Small acute
infarct right occipital cortex.

Ventricle size normal. Bilateral subdural hygromas unchanged from
yesterday. No prior baseline study. These may be chronic. Low
negative for hydrocephalus. Tiny focus of hemorrhage in the right
parietal white matter likely chronic. Small amount of hemorrhage in
the right putamen. No shift of the midline structures

Vascular: Normal arterial flow void

Skull and upper cervical spine: Negative

Sinuses/Orbits: Mild mucosal edema paranasal sinuses. Bilateral lens
replacement

Other: None
IMPRESSION: Acute infarct primarily in the right basal ganglia. Acute infarct
also in the insula on the right and scattered tiny areas of acute
infarct in the cerebral cortex bilaterally right greater than left.
Small amount of hemorrhage in the right putamen. No shift of the
midline structures.

## 2018-10-20 LAB — NOVEL CORONAVIRUS, NAA (HOSP ORDER, SEND-OUT TO REF LAB; TAT 18-24 HRS): SARS-CoV-2, NAA: NOT DETECTED

## 2018-10-20 NOTE — Progress Notes (Signed)
Spoke with patient who states that he has remained in quarantine and is not experiencing any s/s of COVID 19 since his screening. All questions answered appropriately. Jobe Igo, RN

## 2018-10-21 ENCOUNTER — Other Ambulatory Visit: Payer: Self-pay

## 2018-10-21 ENCOUNTER — Ambulatory Visit (HOSPITAL_COMMUNITY): Payer: Medicare Other | Admitting: Anesthesiology

## 2018-10-21 ENCOUNTER — Ambulatory Visit (HOSPITAL_COMMUNITY)
Admission: RE | Admit: 2018-10-21 | Discharge: 2018-10-21 | Disposition: A | Discharge status: Home / Self Care | Payer: Medicare Other | Attending: Gastroenterology | Admitting: Gastroenterology

## 2018-10-21 ENCOUNTER — Encounter (HOSPITAL_COMMUNITY): Payer: Self-pay | Admitting: Registered Nurse

## 2018-10-21 ENCOUNTER — Encounter (HOSPITAL_COMMUNITY)
Admission: RE | Disposition: A | Discharge status: Home / Self Care | Payer: Self-pay | Source: Home / Self Care | Attending: Gastroenterology

## 2018-10-21 DIAGNOSIS — M109 Gout, unspecified: Secondary | ICD-10-CM | POA: Diagnosis not present

## 2018-10-21 DIAGNOSIS — I4819 Other persistent atrial fibrillation: Secondary | ICD-10-CM | POA: Diagnosis not present

## 2018-10-21 DIAGNOSIS — K635 Polyp of colon: Secondary | ICD-10-CM | POA: Diagnosis not present

## 2018-10-21 DIAGNOSIS — E119 Type 2 diabetes mellitus without complications: Secondary | ICD-10-CM | POA: Diagnosis not present

## 2018-10-21 DIAGNOSIS — N400 Enlarged prostate without lower urinary tract symptoms: Secondary | ICD-10-CM | POA: Diagnosis not present

## 2018-10-21 DIAGNOSIS — I4891 Unspecified atrial fibrillation: Secondary | ICD-10-CM | POA: Diagnosis not present

## 2018-10-21 DIAGNOSIS — I2510 Atherosclerotic heart disease of native coronary artery without angina pectoris: Secondary | ICD-10-CM | POA: Diagnosis not present

## 2018-10-21 DIAGNOSIS — Z7901 Long term (current) use of anticoagulants: Secondary | ICD-10-CM | POA: Insufficient documentation

## 2018-10-21 DIAGNOSIS — Z8673 Personal history of transient ischemic attack (TIA), and cerebral infarction without residual deficits: Secondary | ICD-10-CM | POA: Diagnosis not present

## 2018-10-21 DIAGNOSIS — Z7982 Long term (current) use of aspirin: Secondary | ICD-10-CM | POA: Insufficient documentation

## 2018-10-21 DIAGNOSIS — R195 Other fecal abnormalities: Secondary | ICD-10-CM | POA: Insufficient documentation

## 2018-10-21 DIAGNOSIS — Z952 Presence of prosthetic heart valve: Secondary | ICD-10-CM | POA: Insufficient documentation

## 2018-10-21 DIAGNOSIS — K648 Other hemorrhoids: Secondary | ICD-10-CM | POA: Diagnosis not present

## 2018-10-21 DIAGNOSIS — D509 Iron deficiency anemia, unspecified: Secondary | ICD-10-CM | POA: Diagnosis present

## 2018-10-21 DIAGNOSIS — K5730 Diverticulosis of large intestine without perforation or abscess without bleeding: Secondary | ICD-10-CM | POA: Insufficient documentation

## 2018-10-21 DIAGNOSIS — I10 Essential (primary) hypertension: Secondary | ICD-10-CM | POA: Insufficient documentation

## 2018-10-21 HISTORY — PX: POLYPECTOMY: SHX5525

## 2018-10-21 HISTORY — PX: COLONOSCOPY WITH PROPOFOL: SHX5780

## 2018-10-21 SURGERY — COLONOSCOPY WITH PROPOFOL
Anesthesia: Monitor Anesthesia Care

## 2018-10-21 MED ORDER — PROPOFOL 10 MG/ML IV BOLUS
INTRAVENOUS | Status: DC | PRN
  Administered 2018-10-21: 30 mg via INTRAVENOUS

## 2018-10-21 MED ORDER — LACTATED RINGERS IV SOLN
INTRAVENOUS | Status: DC
  Administered 2018-10-21: 07:00:00 via INTRAVENOUS

## 2018-10-21 MED ORDER — PROPOFOL 500 MG/50ML IV EMUL
INTRAVENOUS | Status: DC | PRN
  Administered 2018-10-21: 150 ug/kg/min via INTRAVENOUS

## 2018-10-21 MED ORDER — SODIUM CHLORIDE 0.9 % IV SOLN: INTRAVENOUS | Status: DC

## 2018-10-21 MED ORDER — PROPOFOL 500 MG/50ML IV EMUL
INTRAVENOUS | Status: AC
  Filled 2018-10-21: qty 50

## 2018-10-21 MED ORDER — LIDOCAINE 2% (20 MG/ML) 5 ML SYRINGE
INTRAMUSCULAR | Status: DC | PRN
  Administered 2018-10-21: 60 mg via INTRAVENOUS

## 2018-10-21 SURGICAL SUPPLY — 21 items

## 2018-10-21 NOTE — Anesthesia Preprocedure Evaluation (Signed)
Anesthesia Evaluation  Patient identified by MRN, date of birth, ID band Patient awake    Reviewed: Allergy & Precautions, H&P , NPO status , Patient's Chart, lab work & pertinent test results  Airway Mallampati: II   Neck ROM: full    Dental   Pulmonary neg pulmonary ROS,    breath sounds clear to auscultation       Cardiovascular hypertension, + CAD  + dysrhythmias + Valvular Problems/Murmurs  Rhythm:regular Rate:Normal  Non-obstructive CAD. Pt is s/p AVR, MVr, TVr, and MAZE procedures.  TTE (02/2018): EF 60-65%, severely dilated LA. Moderate mitral stenosis   Neuro/Psych  Neuromuscular disease CVA    GI/Hepatic   Endo/Other  diabetes, Type 2  Renal/GU      Musculoskeletal   Abdominal   Peds  Hematology  (+) Blood dyscrasia, anemia ,   Anesthesia Other Findings   Reproductive/Obstetrics                             Anesthesia Physical Anesthesia Plan  ASA: III  Anesthesia Plan: MAC   Post-op Pain Management:    Induction: Intravenous  PONV Risk Score and Plan: 1 and Propofol infusion and Treatment may vary due to age or medical condition  Airway Management Planned: Nasal Cannula  Additional Equipment:   Intra-op Plan:   Post-operative Plan:   Informed Consent: I have reviewed the patients History and Physical, chart, labs and discussed the procedure including the risks, benefits and alternatives for the proposed anesthesia with the patient or authorized representative who has indicated his/her understanding and acceptance.       Plan Discussed with: CRNA, Anesthesiologist and Surgeon  Anesthesia Plan Comments:         Anesthesia Quick Evaluation

## 2018-10-21 NOTE — Anesthesia Procedure Notes (Signed)
Date/Time: 10/21/2018 7:41 AM Performed by: Talbot Grumbling, CRNA Oxygen Delivery Method: Simple face mask

## 2018-10-21 NOTE — Anesthesia Postprocedure Evaluation (Signed)
Anesthesia Post Note  Patient: [name].  Procedure(s) Performed: COLONOSCOPY WITH PROPOFOL (N/A ) POLYPECTOMY     Patient location during evaluation: Endoscopy Anesthesia Type: MAC Level of consciousness: awake and alert Pain management: pain level controlled Vital Signs Assessment: post-procedure vital signs reviewed and stable Respiratory status: spontaneous breathing, nonlabored ventilation, respiratory function stable and patient connected to nasal cannula oxygen Cardiovascular status: blood pressure returned to baseline and stable Postop Assessment: no apparent nausea or vomiting Anesthetic complications: no    Last Vitals:  Vitals:   10/21/18 0820 10/21/18 0830  BP: (!) 161/77 (!) 186/80  Pulse: 60 69  Resp: 16 (!) 24  Temp:    SpO2: 97% 97%    Last Pain:  Vitals:   10/21/18 0830  TempSrc:   PainSc: 0-No pain                 Fredrika Canby S

## 2018-10-21 NOTE — H&P (Signed)
Primary Care Physician:  Leighton Ruff, MD Primary Gastroenterologist:  Dr. Alessandra Bevels  Reason for  Visit : Anemia, occult blood positive stool  HPI:    This is a 83 year old patient here for posthospitalization follow-up. Past medical history of atrial fibrillation on Coumadin, history of stroke. He was admitted to the hospital on September 05, 2018 with symptom and anemia. Occult blood positive. Underwent EGD which showed gastritis and scattered white specks in the esophagus. Biopsies were negative for H. pylori and candidal esophagitis. His currently on PPI.        He is doing somewhat better since discharge but continues to have fatigue and weakness. Complaining of dyspnea on exertion with certain activity. Denies any active chest pain. Denies abdominal pain, nausea and vomiting. Denies diarrhea or constipation. Denies blood in the stool or black stool. Denies reflux, dysphagia or odynophagia.        Last colonoscopy in March 2018 showed small tubular adenoma in the cecum. It was done by Dr. Amedeo Plenty.  Past Medical History:  Diagnosis Date  . Bladder stones   . Borderline diabetes   . BPH (benign prostatic hyperplasia)   . Coronary artery disease    cardiologist-  dr Nat Math gerhart NP--- per cath 06-02-2010 non-obstructive cad pLAD 30-40%  . Diverticulosis of colon   . Dysrhythmia    afib  . Gout   . Heart murmur   . History of adenomatous polyp of colon    2002-- tubular adenoma  . History of aortic insufficiency    severe -- s/p  AVR 08-03-2010  . History of small bowel obstruction    02/ 2007 mechanical sbo s/p  surgical intervention;  partial sbo 09/ 2011 and 03-20-2011 resolved without surgical intervention  . History of urinary retention   . HTN (hypertension)   . Peripheral neuropathy   . Persistent atrial fibrillation ([place])   . S/P aortic valve replacement with prosthetic valve 08/03/2010   tissue valve  . S/P Maze operation for atrial fibrillation 01/30/2018    Complete bilateral atrial lesion set using bipolar radiofrequency and cryothermy with clipping of LA appendage  . S/P MVR (mitral valve repair) 01/30/2018   Complex valvuloplasty including artificial Gore-tex neochord placement x4 and Car[employer] medics Annuloflex ring annuloplasty, size 28  . S/P patent foramen ovale closure 08/03/2010   at same time AVR  . S/P tricuspid valve repair 01/30/2018   Using an MC3 Annuloplasty ring, size 28  . Stroke ([place])   . Tricuspid regurgitation     Past Surgical History:  Procedure Laterality Date  . BIOPSY  09/07/2018   Procedure: BIOPSY;  Surgeon: Otis Brace, MD;  Location: [facility];  Service: Gastroenterology;;  . CARDIAC CATHETERIZATION  06-02-2010  dr skains   non-obstructive cad- pLAD 30-40%/  normal LVSF/  severe AI  . CARDIOVASCULAR STRESS TEST  04/12/2016   Low risk nuclear perfusion study w/ no significant reversible ischemia/  normal LV function and wall motion ,  stress ef 60%/  96mm inferior and lateral scooped ST-segment depression w/ exercise (may be repolarization abnormality), exercise capacity was moderately reduced  . CATARACT EXTRACTION W/ INTRAOCULAR LENS  IMPLANT, BILATERAL  02/2010  . CLIPPING OF ATRIAL APPENDAGE N/A 01/30/2018   Procedure: CLIPPING OF LEFT ATRIAL APPENDAGE USING ATRICLIP PRO2 45MM;  Surgeon: Rexene Alberts, MD;  Location: [place];  Service: Open Heart Surgery;  Laterality: N/A;  . CYSTOSCOPY WITH LITHOLAPAXY N/A 06/05/2016   Procedure: CYSTOSCOPY WITH LITHOLAPAXY and fulgarization of bladder neck;  Surgeon: Irine Seal, MD;  Location: [facility];  Service: Urology;  Laterality: N/A;  . ESOPHAGOGASTRODUODENOSCOPY (EGD) WITH PROPOFOL N/A 09/07/2018   Procedure: ESOPHAGOGASTRODUODENOSCOPY (EGD) WITH PROPOFOL;  Surgeon: Otis Brace, MD;  Location: [facility];  Service: Gastroenterology;  Laterality: N/A;  . EXPLORATORY LAPARTOMY /  CHOLECYSTECTOMY  02/28/2005   for Small  bowel obstruction  (mechnical)  . IR ANGIO EXTRACRAN SEL COM CAROTID INNOMINATE UNI L MOD SED  10/24/2016  . IR ANGIO VERTEBRAL SEL SUBCLAVIAN INNOMINATE BILAT MOD SED  10/24/2016  . IR PERCUTANEOUS ART THROMBECTOMY/INFUSION INTRACRANIAL INC DIAG ANGIO  10/24/2016  . IR RADIOLOGIST EVAL & MGMT  12/05/2016  . LEFT KNEE ARTHROSCOPY  2006  . MAZE N/A 01/30/2018   Procedure: MAZE;  Surgeon: Rexene Alberts, MD;  Location: [place];  Service: Open Heart Surgery;  Laterality: N/A;  . MITRAL VALVE REPAIR N/A 01/30/2018   Procedure: MITRAL VALVE REPAIR (MVR) USING CARBOMEDICS ANNULOFLEX SIZE 28;  Surgeon: Rexene Alberts, MD;  Location: [place];  Service: Open Heart Surgery;  Laterality: N/A;  . RADIOLOGY WITH ANESTHESIA N/A 10/24/2016   Procedure: RADIOLOGY WITH ANESTHESIA;  Surgeon: Luanne Bras, MD;  Location: [place];  Service: Radiology;  Laterality: N/A;  . RIGHT FOOT SURGERY    . RIGHT MINIATURE ANTERIOR THORACOTOMY FOR AORTIC VALVE REPLACEMENT AND CLOSURE PATENT FORAMEN OVALE  08-03-2010  DR Levada Schilling Magna-ease pericardial tissue valve (27mm)  . RIGHT/LEFT HEART CATH AND CORONARY ANGIOGRAPHY N/A 10/14/2017   Procedure: RIGHT/LEFT HEART CATH AND CORONARY ANGIOGRAPHY;  Surgeon: [name], MD;  Location: [place] [facility];  Service: Cardiovascular;  Laterality: N/A;  . TEE WITHOUT CARDIOVERSION N/A 10/14/2017   Procedure: TRANSESOPHAGEAL ECHOCARDIOGRAM (TEE);  Surgeon: Josue Hector, MD;  Location: [facility];  Service: Cardiovascular;  Laterality: N/A;  . TRANSTHORACIC ECHOCARDIOGRAM  05/30/2016  dr skains   moderate  LVH ef 60-65%/  bioprothesis aortic valve present ,normal grandient and no AI /  mild MV calcification , moderate MR /  mild PR/ moderate TR/  PASP 52mmHg/ (RA denisty was identified 04-27-2016 echo) and is seen again today, this is likely a promient eustacian ridge, atrium is normal size  . TRICUSPID VALVE REPLACEMENT N/A 01/30/2018   Procedure: TRICUSPID VALVE REPAIR USING MC3 SIZE 28;   Surgeon: Rexene Alberts, MD;  Location: [place];  Service: Open Heart Surgery;  Laterality: N/A;    Prior to Admission medications   Medication Sig Start Date End Date Taking? Authorizing Provider  allopurinol (ZYLOPRIM) 300 MG tablet Take 150 mg by mouth daily.   Yes [provider]  aspirin 81 MG EC tablet Take 1 tablet (81 mg total) by mouth daily. Hold for 1 week, may resume 9/7 or as otherwise instructed by your PCP Patient taking differently: Take 81 mg by mouth daily.  09/15/18  Yes Dessa Phi, DO  atenolol (TENORMIN) 25 MG tablet Take 0.5 tablets (12.5 mg total) by mouth daily. 10/14/18  Yes Burtis Junes, NP  b complex vitamins tablet Take 1 tablet by mouth daily.   Yes [provider]  enoxaparin (LOVENOX) 120 MG/0.8ML injection Inject 0.8 mLs (120 mg total) into the skin daily. Patient taking differently: Inject 120 mg into the skin daily. Start 10/16/18 before the procedure Will continue after the procedure 10/14/18  Yes Skains, Thana Farr, MD  ferrous sulfate 325 (65 FE) MG tablet Take 325 mg by mouth daily with breakfast.   Yes [provider]  finasteride (  PROSCAR) 5 MG tablet Take 1 tablet (5 mg total) by mouth daily. 11/05/16  Yes Angiulli, Lavon Paganini, PA-C  lisinopril (ZESTRIL) 5 MG tablet Take 5 mg by mouth daily.   Yes [provider]  pantoprazole (PROTONIX) 40 MG tablet Take 1 tablet (40 mg total) by mouth daily. 09/08/18 12/07/18 Yes Dessa Phi, DO  tamsulosin (FLOMAX) 0.4 MG CAPS capsule Take 2 capsules (0.8 mg total) by mouth daily after breakfast. Patient taking differently: Take 0.8 mg by mouth daily after supper.  11/05/16  Yes Angiulli, Lavon Paganini, PA-C  warfarin (COUMADIN) 5 MG tablet Take 1 tablet daily except 1.5 tablets on Monday and Friday or as directed By Anticoagulation Clinic. Patient taking differently: Take 5-7.5 mg by mouth at bedtime. Take 7.5 mg Monday and Friday All other days take 5 mg 07/22/18  Yes Jerline Pain, MD   amoxicillin (AMOXIL) 500 MG capsule Take 2,000 mg by mouth See admin instructions. Take 2000 mg by mouth 1 hour prior to dental appointment    [provider]    Scheduled Meds: Continuous Infusions: . sodium chloride    . lactated ringers 20 mL/hr at 10/21/18 0706   PRN Meds:.  Allergies as of 09/29/2018 - Review Complete 09/07/2018  Allergen Reaction Noted  . Sulfa antibiotics Other (See Comments) 09/25/2010    Family History  Problem Relation Age of Onset  . Heart disease Mother   . Brain cancer Father     Social History   Socioeconomic History  . Marital status: Married    Spouse name: Not on file  . Number of children: 2  . Years of education: Not on file  . Highest education level: Not on file  Occupational History  . Occupation: RETIRED  Social Needs  . Financial resource strain: Not on file  . Food insecurity    Worry: Not on file    Inability: Not on file  . Transportation needs    Medical: Not on file    Non-medical: Not on file  Tobacco Use  . Smoking status: Never Smoker  . Smokeless tobacco: Never Used  Substance and Sexual Activity  . Alcohol use: Yes    Comment: ONE OR TWO PER MONTH  . Drug use: No  . Sexual activity: Not on file  Lifestyle  . Physical activity    Days per week: Not on file    Minutes per session: Not on file  . Stress: Not on file  Relationships  . Social Herbalist on phone: Not on file    Gets together: Not on file    Attends religious service: Not on file    Active member of club or organization: Not on file    Attends meetings of clubs or organizations: Not on file    Relationship status: Not on file  . Intimate partner violence    Fear of current or ex partner: Not on file    Emotionally abused: Not on file    Physically abused: Not on file    Forced sexual activity: Not on file  Other Topics Concern  . Not on file  Social History Narrative  . Not on file    Review of Systems: All  negative except as stated above in HPI.  Physical Exam: Vital signs: Vitals:   10/21/18 0701  BP: (!) 222/95  Pulse: 75  Resp: 13  Temp: 98.7 F (37.1 C)  SpO2: 99%     General:   Alert,  Well-developed, well-nourished, pleasant and cooperative in NAD Lungs:  Clear throughout to auscultation.   No wheezes, crackles, or rhonchi. No acute distress. Heart:  Regular rate and rhythm; no murmurs, clicks, rubs,  or gallops. Abdomen: soft, NT, ND, BS +  Rectal:  Deferred  GI:  Lab Results: No results for input(s): WBC, HGB, HCT, PLT in the last 72 hours. BMET No results for input(s): NA, K, CL, CO2, GLUCOSE, BUN, CREATININE, CALCIUM in the last 72 hours. LFT No results for input(s): PROT, ALBUMIN, AST, ALT, ALKPHOS, BILITOT, BILIDIR, IBILI in the last 72 hours. PT/INR No results for input(s): LABPROT, INR in the last 72 hours.   Studies/Results: No results found.  Impression/Plan: Occult blood positive stool  Anemia H/O Afib - coumadin on hold for last 5 days   Recommendations ------------------------- -Proceed with colonoscopy  Risks (bleeding, infection, bowel perforation that could require surgery, sedation-related changes in cardiopulmonary systems), benefits (identification and possible treatment of source of symptoms, exclusion of certain causes of symptoms), and alternatives (watchful waiting, radiographic imaging studies, empiric medical treatment)  were explained to patient in detail and patient wishes to proceed.    LOS: 0 days   Otis Brace  MD, FACP 10/21/2018, 7:35 AM  Contact #  [phone]

## 2018-10-21 NOTE — Transfer of Care (Signed)
Immediate Anesthesia Transfer of Care Note  Patient: [name].  Procedure(s) Performed: COLONOSCOPY WITH PROPOFOL (N/A ) POLYPECTOMY  Patient Location: PACU  Anesthesia Type:MAC  Level of Consciousness: sedated  Airway & Oxygen Therapy: Patient Spontanous Breathing and Patient connected to face mask oxygen  Post-op Assessment: Report given to RN and Post -op Vital signs reviewed and stable  Post vital signs: Reviewed and stable  Last Vitals:  Vitals Value Taken Time  BP    Temp    Pulse    Resp    SpO2      Last Pain:  Vitals:   10/21/18 0701  TempSrc: Oral  PainSc: 0-No pain         Complications: No apparent anesthesia complications

## 2018-10-21 NOTE — Discharge Instructions (Signed)
YOU HAD AN ENDOSCOPIC PROCEDURE TODAY: Refer to the procedure report and other information in the discharge instructions given to you for any specific questions about what was found during the examination. If this information does not answer your questions, please call [facility] office at [phone] to clarify.  ? ?YOU SHOULD EXPECT: Some feelings of bloating in the abdomen. Passage of more gas than usual. Walking can help get rid of the air that was put into your GI tract during the procedure and reduce the bloating. If you had a lower endoscopy (such as a colonoscopy or flexible sigmoidoscopy) you may notice spotting of blood in your stool or on the toilet paper. Some abdominal soreness may be present for a day or two, also. ? ?DIET: Your first meal following the procedure should be a light meal and then it is ok to progress to your normal diet. A half-sandwich or bowl of soup is an example of a good first meal. Heavy or fried foods are harder to digest and may make you feel nauseous or bloated. Drink plenty of fluids but you should avoid alcoholic beverages for 24 hours.  ? ?ACTIVITY: Your care partner should take you home directly after the procedure. You should plan to take it easy, moving slowly for the rest of the day. You can resume normal activity the day after the procedure however YOU SHOULD NOT DRIVE, use power tools, machinery or perform tasks that involve climbing or major physical exertion for 24 hours (because of the sedation medicines used during the test).  ? ?SYMPTOMS TO REPORT IMMEDIATELY: ?A gastroenterologist can be reached at any hour. Please call [phone]  for any of the following symptoms:  ?Following lower endoscopy (colonoscopy, flexible sigmoidoscopy) ?Excessive amounts of blood in the stool  ?Significant tenderness, worsening of abdominal pains  ?Swelling of the abdomen that is new, acute  ?Fever of 100? or higher  ? ?FOLLOW UP:  ?If any biopsies were taken you will be contacted by  phone or by letter within the next 1-3 weeks. Call [phone]  if you have not heard about the biopsies in 3 weeks.  ?Please also call with any specific questions about appointments or follow up tests.  ?

## 2018-10-21 NOTE — Op Note (Signed)
[facility] Patient Name: [name] Procedure Date: 10/21/2018 MRN: [id number] Attending MD: Otis Brace , MD Date of Birth: 02/02/1935 CSN: [id number] Age: 83 Admit Type: Outpatient Procedure:                Colonoscopy Indications:              Last colonoscopy: March 2018, Unexplained iron                            deficiency anemia Providers:                Otis Brace, MD, Ashley Jacobs, RN, Cherylynn Ridges, Technician, Marla Roe, CRNA Referring MD:              Medicines:                Sedation Administered by an Anesthesia Professional Complications:            No immediate complications. Estimated Blood Loss:     Estimated blood loss was minimal. Procedure:                Pre-Anesthesia Assessment:                           - Prior to the procedure, a History and Physical                            was performed, and patient medications and                            allergies were reviewed. The patient's tolerance of                            previous anesthesia was also reviewed. The risks                            and benefits of the procedure and the sedation                            options and risks were discussed with the patient.                            All questions were answered, and informed consent                            was obtained. Prior Anticoagulants: The patient                            last took Coumadin (warfarin) 5 days and Lovenox                            (enoxaparin) 1 day prior to the procedure. ASA  Grade Assessment: III - A patient with severe                            systemic disease. After reviewing the risks and                            benefits, the patient was deemed in satisfactory                            condition to undergo the procedure.                           After obtaining informed consent, the colonoscope          was passed under direct vision. Throughout the                            procedure, the patient's blood pressure, pulse, and                            oxygen saturations were monitored continuously. The                            [id number] Olympus pediatric colonscope                            was introduced through the anus and advanced to the                            the terminal ileum, with identification of the                            appendiceal orifice and IC valve. The colonoscopy                            was performed without difficulty. The patient                            tolerated the procedure well. The quality of the                            bowel preparation was good except the sigmoid colon                            was fair. Scope In: 7:45:47 AM Scope Out: 8:05:48 AM Scope Withdrawal Time: 0 hours 14 minutes 6 seconds  Total Procedure Duration: 0 hours 20 minutes 1 second  Findings:      The perianal and digital rectal examinations were normal.      The terminal ileum appeared normal.      A 3 mm polyp was found in the ascending colon. The polyp was removed       with a cold biopsy forceps. Resection and retrieval were complete.      Multiple small and large-mouthed diverticula were found in the left       colon.  Internal hemorrhoids were found during retroflexion. The hemorrhoids       were small.      Retroflexion in the right colon was performed. Impression:               - The examined portion of the ileum was normal.                           - One 3 mm polyp in the ascending colon, removed                            with a cold biopsy forceps. Resected and retrieved.                           - Diverticulosis in the left colon.                           - Internal hemorrhoids. Moderate Sedation:      Moderate (conscious) sedation was personally administered by an       anesthesia professional. The following parameters were  monitored: oxygen       saturation, heart rate, blood pressure, and response to care. Recommendation:           - Patient has a contact number available for                            emergencies. The signs and symptoms of potential                            delayed complications were discussed with the                            patient. Return to normal activities tomorrow.                            Written discharge instructions were provided to the                            patient.                           - Resume previous diet.                           - Continue present medications.                           - Await pathology results.                           - Resume Coumadin (warfarin) today at prior dose. Procedure Code(s):        --- Professional ---                           [phone], Colonoscopy, flexible; with biopsy, single  or multiple Diagnosis Code(s):        --- Professional ---                           K64.8, Other hemorrhoids                           K63.5, Polyp of colon                           D50.9, Iron deficiency anemia, unspecified                           K57.30, Diverticulosis of large intestine without                            perforation or abscess without bleeding CPT copyright 2019 [employer]. All rights reserved. The codes documented in this report are preliminary and upon coder review may  be revised to meet current compliance requirements. Otis Brace, MD Otis Brace, MD 10/21/2018 8:18:35 AM Number of Addenda: 0

## 2018-10-22 LAB — SURGICAL PATHOLOGY

## 2018-10-23 ENCOUNTER — Encounter (HOSPITAL_COMMUNITY): Payer: Self-pay | Admitting: Gastroenterology

## 2018-10-28 ENCOUNTER — Other Ambulatory Visit: Payer: Self-pay

## 2018-10-28 ENCOUNTER — Ambulatory Visit: Payer: Medicare Other | Admitting: *Deleted

## 2018-10-28 DIAGNOSIS — I63411 Cerebral infarction due to embolism of right middle cerebral artery: Secondary | ICD-10-CM

## 2018-10-28 DIAGNOSIS — Z9889 Other specified postprocedural states: Secondary | ICD-10-CM | POA: Diagnosis not present

## 2018-10-28 DIAGNOSIS — Z7901 Long term (current) use of anticoagulants: Secondary | ICD-10-CM | POA: Diagnosis not present

## 2018-10-28 DIAGNOSIS — I4811 Longstanding persistent atrial fibrillation: Secondary | ICD-10-CM | POA: Diagnosis not present

## 2018-10-28 LAB — POCT INR: INR: 1.5 — AB (ref 2.0–3.0)

## 2018-10-28 MED ORDER — ENOXAPARIN SODIUM 120 MG/0.8ML ~~LOC~~ SOLN: 120.0000 mg | SUBCUTANEOUS | 1 refills | Status: DC

## 2018-10-28 NOTE — Patient Instructions (Addendum)
Description   Continue taking lovenox once a day. Take 1.5 tablets of coumadin today and tomorrow, then Continue on same dosage 1 tablet daily except 1.5 tablets on Mondays and Fridays.  Recheck INR in 3 days. [facility] [phone] call with bleeding problems or new medications.

## 2018-10-31 ENCOUNTER — Other Ambulatory Visit: Payer: Self-pay

## 2018-10-31 ENCOUNTER — Ambulatory Visit (INDEPENDENT_AMBULATORY_CARE_PROVIDER_SITE_OTHER): Payer: Medicare Other | Admitting: *Deleted

## 2018-10-31 DIAGNOSIS — I63411 Cerebral infarction due to embolism of right middle cerebral artery: Secondary | ICD-10-CM

## 2018-10-31 DIAGNOSIS — Z9889 Other specified postprocedural states: Secondary | ICD-10-CM

## 2018-10-31 DIAGNOSIS — Z7901 Long term (current) use of anticoagulants: Secondary | ICD-10-CM | POA: Diagnosis not present

## 2018-10-31 DIAGNOSIS — I4811 Longstanding persistent atrial fibrillation: Secondary | ICD-10-CM

## 2018-10-31 LAB — POCT INR: INR: 2.2 (ref 2.0–3.0)

## 2018-10-31 NOTE — Patient Instructions (Addendum)
Description     Stop lovenox, Continue on same dosage 1 tablet daily except 1.5 tablets on Mondays and Fridays.  Recheck INR in 3 weeks. [facility] [phone] call with bleeding problems or new medications.

## 2018-11-20 NOTE — Progress Notes (Signed)
CARDIOLOGY OFFICE NOTE  Date:  11/25/2018    Gerald Ape Truszkowski Jr. Date of Birth: 14-Jun-1935 Medical Record [id number]  PCP:  Gerald Ruff, MD  Cardiologist:  [name]   Chief Complaint  Patient presents with   Follow-up    History of Present Illness: Gerald Arias. is a 83 y.o. male who presents today for a follow up visit.  Seen for Dr. Marlou Ayers. Primarily follows with me. Has previously seen Dr. Burt Ayers for structural heart.   [name] has a history of a prioraortic valve replacement using abioprosthetictissue valve in 2012,hypertension, atrial fibrillation on long-term anticoagulation, and previous embolic stroke in [id number].  Patient's cardiac history dates back to 2012 when he underwent aortic valve replacement using a52mm Edwards Magna Easestented bovine pericardial tissue valve via right minithoracotomy approach for severe aortic insufficiency. He recovered uneventfully and was followed for a period of time by Dr. Marlou Ayers but subsequently lost to follow-up. I have followed him since 2018 and previous echocardiograms document the presence of normal functioning bioprosthetic tissue valve in aortic position with mild mitral regurgitation and normal left ventricular systolic function. In October 2018 he hadan acute right middle cerebral artery stroke associated with dense left-sided hemiplegia. He was promptly treated with catheter directed therapy and he recovered remarkably well. Transthoracic echocardiogram performed at that time revealed normal left ventricular function with normal functioning bioprosthetic tissue valve in aortic position and what was felt to be moderate mitral regurgitation. The patient was notably in atrial fibrillation. Transesophageal echocardiogram was not performed but the patient was started on Eliquis for long-term anticoagulation. He recovered remarkably well from his stroke and has eventually returned to essentially  baseline physical status.He did report decreased exercise tolerance with poor endurance, and he admitted to some exertional shortness of breath with more strenuous activity. However, hecontinuedto walk every day and he exercisedreligiously, including doing push-ups and lifting some weights. Follow-up echocardiogram revealed preserved left ventricular function but worsening mitral regurgitation. He was referred to Dr. Burt Ayers for consultation and underwent transesophageal echocardiogram and diagnostic cardiac catheterization on October 14, 2017. TEE revealed normal left ventricular systolic function with normal functioning bioprosthetic tissue valve in aortic position. There was severe mitral regurgitation with mitral valve prolapse including a flail segment involving the middle scallop of the posterior leaflet. There was severe left atrial enlargement. There was severe tricuspid regurgitation with bileaflet prolapse. Catheterization revealed mild nonobstructive coronary artery disease with no change in comparison with catheterization performed in 2012. There were large V waves on wedge tracing consistent with severe mitral regurgitation. Pulmonary artery pressures were mildly elevated.  Patient underwent a redo sternotomy, MV and TV repair, MAZE, and LA clip on 01/30/18. His post op course was uneventful.He did develop some residual urinary retention and required I/O catheterization. He was restarted on Flomax and Proscarfor this. He developed junctional bradycardia and was not started on a beta blocker. He was paced for the first several days post op. He was then put on VVI backup on 01/27.His underlying rhythmwas junctional with heart rate in the low 50's. After transfer from the ICU, his heart rate was increased into the 60-70's. Hewasasymptomatic, even with previous bradycardia.He was volume over loaded and diuresed. He had ABL anemia. He did not require a post op transfusion. He was  weaned off the insulin drip. He was started on Coumadin for his MV Repair and MAZE procedure. He wasdischarged home on 5mg  of Coumadin.He was able to be started onLisinopril 5 mg  daily.  I then saw himin February for his post op visit- lots of concerns. Was not feeling as well as he wished or thought he should be. Was losing more weight. Was doing a lot of walkingand probably was overdoing it. At follow up in March he was doing ok. He and his wife were struggling with the need to social distance - they typically eat out most meals - cardiac status ok.   We did a telehealth visit back in May - lots of questions and concerns. Still with no stamina - not at the level that he thought he should be at but was felt to be doing pretty good. Lightheaded with position changes. He was to have a repeat MRI on his spine - there was concern at L3 of a possible lesion - he has had no known malignancy (actually had extensive CT scans for weight loss in the past). This turned out to be benign with no further imaging warranted. He was concerned about the results of his last echo.    I discussed his care with Dr. Burt Ayers - we elected to keep him on coumadin anticoagulation given valvular disease and AF. This is actually a cheaper option for him as well.  His echo had been reviewed by Dr. Roxy Ayers & Dr. Burt Ayers. No need for any intervention for the mitral stenosis. Last seen by me back in July - was doing ok - stamina improving. He did have a GI bleed back in August - found to have gastritis and Candida - did not require transfusion - coumadin was able to be restarted. Noted to be on lifelong PPI therapy.   The patient does not have symptoms concerning for COVID-19 infection (fever, chills, cough, or new shortness of breath).   Comes in today. Here alone. Doing ok. Asking about labs today - he has not had any drawn since his GI bleed. Asking about his Protonix - has no refills - noted that this was to be lifelong.  He is still on iron. He is wondering if he needs to see hematology. He has an appointment in the AF clinic tomorrow - he does not wish to go. He has no issues with his rhythm. Denies palpitations. Coumadin is going ok. He has no active bleeding that he is aware of. No excessive bruising. He is exercising. Rode his bike outside yesterday. BP much better at home - says he is in the 135 range consistently and much lower than what it is here today. He feels like he is doing well.   Past Medical History:  Diagnosis Date   Bladder stones    Borderline diabetes    BPH (benign prostatic hyperplasia)    Coronary artery disease    cardiologist-  dr Nat Math gerhart NP--- per cath 06-02-2010 non-obstructive cad pLAD 30-40%   Diverticulosis of colon    Dysrhythmia    afib   Gout    Heart murmur    History of adenomatous polyp of colon    2002-- tubular adenoma   History of aortic insufficiency    severe -- s/p  AVR 08-03-2010   History of small bowel obstruction    02/ 2007 mechanical sbo s/p  surgical intervention;  partial sbo 09/ 2011 and 03-20-2011 resolved without surgical intervention   History of urinary retention    HTN (hypertension)    Peripheral neuropathy    Persistent atrial fibrillation (HCC)    S/P aortic valve replacement with prosthetic valve 08/03/2010  tissue valve   S/P Maze operation for atrial fibrillation 01/30/2018   Complete bilateral atrial lesion set using bipolar radiofrequency and cryothermy with clipping of LA appendage   S/P MVR (mitral valve repair) 01/30/2018   Complex valvuloplasty including artificial Gore-tex neochord placement x4 and [employer] medics Annuloflex ring annuloplasty, size 28   S/P patent foramen ovale closure 08/03/2010   at same time AVR   S/P tricuspid valve repair 01/30/2018   Using an MC3 Annuloplasty ring, size 28   Stroke [facility])    Tricuspid regurgitation     Past Surgical History:  Procedure Laterality Date    BIOPSY  09/07/2018   Procedure: BIOPSY;  Surgeon: Otis Brace, MD;  Location: [facility];  Service: Gastroenterology;;   CARDIAC CATHETERIZATION  06-02-2010  dr [name]   non-obstructive cad- pLAD 30-40%/  normal LVSF/  severe AI   CARDIOVASCULAR STRESS TEST  04/12/2016   Low risk nuclear perfusion study w/ no significant reversible ischemia/  normal LV function and wall motion ,  stress ef 60%/  29mm inferior and lateral scooped ST-segment depression w/ exercise (may be repolarization abnormality), exercise capacity was moderately reduced   CATARACT EXTRACTION W/ INTRAOCULAR LENS  IMPLANT, BILATERAL  02/2010   CLIPPING OF ATRIAL APPENDAGE N/A 01/30/2018   Procedure: CLIPPING OF LEFT ATRIAL APPENDAGE USING ATRICLIP PRO2 45MM;  Surgeon: Rexene Alberts, MD;  Location: [place];  Service: Open Heart Surgery;  Laterality: N/A;   COLONOSCOPY WITH PROPOFOL N/A 10/21/2018   Procedure: COLONOSCOPY WITH PROPOFOL;  Surgeon: Otis Brace, MD;  Location: [facility];  Service: Gastroenterology;  Laterality: N/A;   CYSTOSCOPY WITH LITHOLAPAXY N/A 06/05/2016   Procedure: CYSTOSCOPY WITH LITHOLAPAXY and fulgarization of bladder neck;  Surgeon: Irine Seal, MD;  Location[facility];  Service: Urology;  Laterality: N/A;   ESOPHAGOGASTRODUODENOSCOPY (EGD) WITH PROPOFOL N/A 09/07/2018   Procedure: ESOPHAGOGASTRODUODENOSCOPY (EGD) WITH PROPOFOL;  Surgeon: Otis Brace, MD;  Location: [facility];  Service: Gastroenterology;  Laterality: N/A;   EXPLORATORY LAPARTOMY /  CHOLECYSTECTOMY  02/28/2005   for Small  bowel obstruction (mechnical)   IR ANGIO EXTRACRAN SEL COM CAROTID INNOMINATE UNI L MOD SED  10/24/2016   IR ANGIO VERTEBRAL SEL SUBCLAVIAN INNOMINATE BILAT MOD SED  10/24/2016   IR PERCUTANEOUS ART THROMBECTOMY/INFUSION INTRACRANIAL INC DIAG ANGIO  10/24/2016   IR RADIOLOGIST EVAL & MGMT  12/05/2016   LEFT KNEE ARTHROSCOPY  2006   MAZE N/A 01/30/2018   Procedure:  MAZE;  Surgeon: Rexene Alberts, MD;  Location: [place];  Service: Open Heart Surgery;  Laterality: N/A;   MITRAL VALVE REPAIR N/A 01/30/2018   Procedure: MITRAL VALVE REPAIR (MVR) USING CARBOMEDICS ANNULOFLEX SIZE 28;  Surgeon: Rexene Alberts, MD;  Location: [place];  Service: Open Heart Surgery;  Laterality: N/A;   POLYPECTOMY  10/21/2018   Procedure: POLYPECTOMY;  Surgeon: Otis Brace, MD;  Location: [facility];  Service: Gastroenterology;;   RADIOLOGY WITH ANESTHESIA N/A 10/24/2016   Procedure: RADIOLOGY WITH ANESTHESIA;  Surgeon: Luanne Bras, MD;  Location: [place];  Service: Radiology;  Laterality: N/A;   RIGHT FOOT SURGERY     RIGHT MINIATURE ANTERIOR THORACOTOMY FOR AORTIC VALVE REPLACEMENT AND CLOSURE PATENT FORAMEN OVALE  08-03-2010  DR Levada Schilling Magna-ease pericardial tissue valve (71mm)   RIGHT/LEFT HEART CATH AND CORONARY ANGIOGRAPHY N/A 10/14/2017   Procedure: RIGHT/LEFT HEART CATH AND CORONARY ANGIOGRAPHY;  Surgeon: [name], MD;  Location: [place] [facility];  Service: Cardiovascular;  Laterality: N/A;  TEE WITHOUT CARDIOVERSION N/A 10/14/2017   Procedure: TRANSESOPHAGEAL ECHOCARDIOGRAM (TEE);  Surgeon: Josue Hector, MD;  Location: [facility];  Service: Cardiovascular;  Laterality: N/A;   TRANSTHORACIC ECHOCARDIOGRAM  05/30/2016  dr skains   moderate  LVH ef 60-65%/  bioprothesis aortic valve present ,normal grandient and no AI /  mild MV calcification , moderate MR /  mild PR/ moderate TR/  PASP 22mmHg/ (RA denisty was identified 04-27-2016 echo) and is seen again today, this is likely a promient eustacian ridge, atrium is normal size   TRICUSPID VALVE REPLACEMENT N/A 01/30/2018   Procedure: TRICUSPID VALVE REPAIR USING MC3 SIZE 28;  Surgeon: Rexene Alberts, MD;  Location: [place];  Service: Open Heart Surgery;  Laterality: N/A;     Medications: Current Meds  Medication Sig   allopurinol (ZYLOPRIM) 300 MG tablet Take 150 mg by mouth daily.     amoxicillin (AMOXIL) 500 MG capsule Take 2,000 mg by mouth See admin instructions. Take 2000 mg by mouth 1 hour prior to dental appointment   aspirin 81 MG EC tablet Take 1 tablet (81 mg total) by mouth daily. Hold for 1 week, may resume 9/7 or as otherwise instructed by your PCP (Patient taking differently: Take 81 mg by mouth daily. )   atenolol (TENORMIN) 25 MG tablet Take 0.5 tablets (12.5 mg total) by mouth daily.   b complex vitamins tablet Take 1 tablet by mouth daily.   ferrous sulfate 325 (65 FE) MG tablet Take 325 mg by mouth daily with breakfast.   finasteride (PROSCAR) 5 MG tablet Take 1 tablet (5 mg total) by mouth daily.   lisinopril (ZESTRIL) 5 MG tablet Take 5 mg by mouth daily.   pantoprazole (PROTONIX) 40 MG tablet Take 1 tablet (40 mg total) by mouth daily.   tamsulosin (FLOMAX) 0.4 MG CAPS capsule Take 2 capsules (0.8 mg total) by mouth daily after breakfast. (Patient taking differently: Take 0.8 mg by mouth daily after supper. )   warfarin (COUMADIN) 5 MG tablet Take 1 tablet daily except 1.5 tablets on Monday and Friday or as directed By Anticoagulation Clinic. (Patient taking differently: Take 5-7.5 mg by mouth at bedtime. Take 7.5 mg Monday and Friday All other days take 5 mg)   [DISCONTINUED] pantoprazole (PROTONIX) 40 MG tablet Take 1 tablet (40 mg total) by mouth daily.     Allergies: Allergies  Allergen Reactions   Sulfa Antibiotics Other (See Comments)    Granulocytosis    Social History: The patient  reports that he has never smoked. He has never used smokeless tobacco. He reports current alcohol use. He reports that he does not use drugs.   Family History: The patient's family history includes Brain cancer in his father; Heart disease in his mother.   Review of Systems: Please see the history of present illness.   All other systems are reviewed and negative.   Physical Exam: VS:  BP (!) 146/82    Pulse (!) 59    Ht 6' (1.829 m)    Wt 172  lb 1.9 oz (78.1 kg)    SpO2 98%    BMI 23.34 kg/m  .  BMI Body mass index is 23.34 kg/m.  Wt Readings from Last 3 Encounters:  11/25/18 172 lb 1.9 oz (78.1 kg)  10/21/18 165 lb (74.8 kg)  09/05/18 162 lb 4.1 oz (73.6 kg)   BP recheck by me is 160/84.   General: Pleasant. Alert and in no acute distress.    HEENT:  Normal.  Neck: Supple, no JVD, carotid bruits, or masses noted.  Cardiac: Fairly regular - rate is ok. Soft outflow murmur. No edema.  Respiratory:  Lungs are clear to auscultation bilaterally with normal work of breathing.  GI: Soft and nontender.  MS: No deformity or atrophy. Gait and ROM intact.  Skin: Warm and dry. Color is normal.  Neuro:  Strength and sensation are intact and no gross focal deficits noted.  Psych: Alert, appropriate and with normal affect.   LABORATORY DATA:  EKG:  EKG is ordered today. This demonstrates atrial flutter with PVCs - HR is 68 today.   Lab Results  Component Value Date   WBC 5.6 09/08/2018   HGB 8.2 (L) 09/08/2018   HCT 25.2 (L) 09/08/2018   PLT 164 09/08/2018   GLUCOSE 109 (H) 09/07/2018   CHOL 87 10/25/2016   TRIG 54 10/25/2016   HDL 26 (L) 10/25/2016   LDLCALC 50 10/25/2016   ALT 14 09/06/2018   AST 24 09/06/2018   NA 139 09/07/2018   K 4.1 09/07/2018   CL 109 09/07/2018   CREATININE 0.95 09/07/2018   BUN 15 09/07/2018   CO2 24 09/07/2018   TSH 2.411 09/06/2018   INR 2.2 10/31/2018   HGBA1C 5.1 09/06/2018       BNP (last 3 results) No results for input(s): BNP in the last 8760 hours.  ProBNP (last 3 results) No results for input(s): PROBNP in the last 8760 hours.   Other Studies Reviewed Today:  EchoIMPRESSIONS2/2020  1. The left ventricle has normal systolic function with an ejection fraction of 60-65%. The cavity size was normal. Left ventricular diastolic Doppler parameters are indeterminate. 2. Left atrial size was severely dilated. 3. Right atrial size was mildly dilated. 4. Mitral valve  repair. Moderate mitral valve stenosis. 5. The tricuspid valve is tricuspid valve annuloplasty ring present. 6. Aortic valve regurgitation is trivial by color flow Doppler. 7. The pulmonic valve was normal in structure. Pulmonic valve regurgitation is mild by color flow Doppler. 8. There is mild dilatation of the ascending aorta measuring 37 mm.  Note has also been reviewed by Dr. Roxy Ayers -He has a 26mm ring and the anterior leaflet moves normally with no leaflet restriction. There is flow acceleration across the valve which is typical following mitral valve repair. Mean gradient was estimated 5-6 mmHg which he may notice clinically during exercise, but still correlates to calculated MVA >1.5. As active as he is the cardiac output is likely relatively high. Ventricular compliance plays a role as well. Based on the 2D views of the valve orifice it appears that the MVA would be considerably larger than 1.5 using planimetry, which makes sense given the 71mm ring. In my experience, patients who are very active and exercise strenuously often times feel like their peak exercise capacity is slow to recover after mitral valve repair, and I suspect this may be related to the sudden change from severe MR to mild functional mitral stenosis, making it more difficult to generate high cardiac output. 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 I will discuss with him when I see him.   Procedure (s)01/30/2018:  Mitral Valve Repair Redo median sternotomy Percutaneous arterial and venous cannulation Complexmitralvalvuloplasty including artificial Gore-tex neochord placement x4 Sorin Carbomedics Annuloflex ringannuloplasty (size77mm,catalog# [id number], serial [id number])   TricuspidValve Repair Edwards mc3 ringannuloplasty (size67mm,model# [id number], serial [id number])   Maze  Procedure complete bilateral atrial lesion set using bipolar radiofrequency and cryothermy  ablation clipping of left atrial appendage (Atricure Pro245 left atrial clip,size 53mm) Removal of large, anomalous muscle band traversing the right atriumby Dr. Roxy Ayers on 01/30/2018   RIGHT/LEFT HEART CATH AND CORONARY ANGIOGRAPHY10/2019  Conclusion     Prox Cx to Mid Cx lesion is 30% stenosed.  Ost LAD to Prox LAD lesion is 30% stenosed.  Prox LAD to Mid LAD lesion is 40% stenosed.  1. Nonobstructive coronary artery disease with no significant change compared to 2012 cardiac catheterization study 2. Large V waves in the pulmonary capillary wedge tracing consistent with severe mitral insufficiency  Recommend: referral to cardiac surgery for further evaluation of treatment options (surgical mitral valve repair + Maze versus percutaneous mitral valve repair)  Resume apixaban tomorrow morning     ASSESSMENT & PLAN:   1. Prior MV repair, TV repair, MAZE and clipping of LA appendage from January 2020 -we have added back low dose beta blocker to help increase his filling time - he is doing well clinically. Using SBE.   2. Prior AVRfrom 2012 - see above.  3. Prior stroke- he is chronically anticoagulated.Getting INR today. No acute deficits noted but he does tend to repeat himself and needs more direction today and repeated information given to him. I think his wife needs to be here for the next visit with [place]. .  4. Persistent AF(probably related to valvular heart disease)- s/p MAZE - looks like he has reverted to atrial flutter - noted on EKG from August and again today - he is not symptomatic. We discussed cardioversion. Does not wish to go to the AF clinic and at this time, wishes to hold off on any further evaluation or intervention. He is not interested in additional medicines at this time. We discussed this several times during  today's visit. His HR is fine. He is on anticoagulation. He has had prior MAZE.   5. Chronic anticoagulation - on Coumadin -have elected to continue - it is a cheaper option for him - no recent falls - has had GI bleed from August - rechecking lab today.   6. HTN -BP recheck by me is higher - he is adamant he has better control at home - he will continue to monitor.   7.Positional lightheadedness - resolved.   8. ?L3 lesion - repeat MRI showed this to be benign - no further follow up noted. Not discussed.   9. Prior GI bleed - found to have gastritis/Candida on EGD - on lifelong anticoagulation - also on lifelong PPI - Protonix was refilled - will recheck lab today - would hold on hematology referral until we see what that shows. He is on iron and would like to stop.   10. COVID-19 Education: The signs and symptoms of COVID-19 were discussed with the patient and how to seek care for testing (follow up with PCP or arrange E-visit).  The importance of social distancing, staying at home, hand hygiene and wearing a mask when out in public were discussed today.  Current medicines are reviewed with the patient today.  The patient does not have concerns regarding medicines other than what has been noted above.  The following changes have been made:  See above.  Labs/ tests ordered today include:    Orders Placed This Encounter  Procedures   Basic metabolic panel   CBC no Diff   Hepatic function panel   Lipid Profile   EKG 12/Charge capture     Disposition:   FU with me in about  a month - repeat EKG.   Patient is agreeable to this plan and will call if any problems develop in the interim.   SignedTruitt Merle, NP  11/25/2018 8:31 AM  [facility] [address] [place], [place]  [address] Phone: [phone] Fax: [phone]

## 2018-11-25 ENCOUNTER — Encounter: Payer: Self-pay | Admitting: Nurse Practitioner

## 2018-11-25 ENCOUNTER — Ambulatory Visit: Payer: Medicare Other | Admitting: Nurse Practitioner

## 2018-11-25 ENCOUNTER — Other Ambulatory Visit: Payer: Self-pay

## 2018-11-25 ENCOUNTER — Ambulatory Visit (INDEPENDENT_AMBULATORY_CARE_PROVIDER_SITE_OTHER): Payer: Medicare Other | Admitting: *Deleted

## 2018-11-25 VITALS — BP 146/82 | HR 59 | Ht 72.0 in | Wt 172.1 lb

## 2018-11-25 DIAGNOSIS — Z7901 Long term (current) use of anticoagulants: Secondary | ICD-10-CM

## 2018-11-25 DIAGNOSIS — Z952 Presence of prosthetic heart valve: Secondary | ICD-10-CM

## 2018-11-25 DIAGNOSIS — I63411 Cerebral infarction due to embolism of right middle cerebral artery: Secondary | ICD-10-CM | POA: Diagnosis not present

## 2018-11-25 DIAGNOSIS — Z9889 Other specified postprocedural states: Secondary | ICD-10-CM | POA: Diagnosis not present

## 2018-11-25 DIAGNOSIS — I4811 Longstanding persistent atrial fibrillation: Secondary | ICD-10-CM | POA: Diagnosis not present

## 2018-11-25 LAB — POCT INR: INR: 2.3 (ref 2.0–3.0)

## 2018-11-25 MED ORDER — PANTOPRAZOLE SODIUM 40 MG PO TBEC: 40.0000 mg | DELAYED_RELEASE_TABLET | Freq: Every day | ORAL | 3 refills | Status: DC

## 2018-11-25 NOTE — Patient Instructions (Addendum)
After Visit Summary:  We will be checking the following labs today - BMET, CBC, HPF and lipids   Medication Instructions:    Continue with your current medicines.   I refilled the Protonix to [facility]   If you need a refill on your cardiac medications before your next appointment, please call your pharmacy.     Testing/Procedures To Be Arranged:  N/A  Follow-Up:   See me in one month with EKG    At [facility], you and your health needs are our priority.  As part of our continuing mission to provide you with exceptional heart care, we have created designated Provider Care Teams.  These Care Teams include your primary Cardiologist (physician) and Advanced Practice Providers (APPs -  Physician Assistants and Nurse Practitioners) who all work together to provide you with the care you need, when you need it.  Special Instructions:  . Stay safe, stay home, wash your hands for at least 20 seconds and wear a mask when out in public.  . It was good to talk with you today.    Call the [place] office at [phone] if you have any questions, problems or concerns.

## 2018-11-25 NOTE — Patient Instructions (Signed)
Description   Continue on same dosage 1 tablet daily except 1.5 tablets on Mondays and Fridays.  Recheck INR in 4 weeks. Coumadin Clinic [phone] call with bleeding problems or new medications.

## 2018-11-26 ENCOUNTER — Ambulatory Visit (HOSPITAL_COMMUNITY): Payer: Medicare Other | Admitting: Physician Assistant

## 2018-11-27 ENCOUNTER — Telehealth: Payer: Self-pay | Admitting: *Deleted

## 2018-11-27 LAB — CBC
Hematocrit: 40.2 % (ref 37.5–51.0)
Hemoglobin: 12.7 g/dL — ABNORMAL LOW (ref 13.0–17.7)
MCH: 30.1 pg (ref 26.6–33.0)
MCHC: 31.6 g/dL (ref 31.5–35.7)
MCV: 95 fL (ref 79–97)
Platelets: 157 10*3/uL (ref 150–450)
RBC: 4.22 x10E6/uL (ref 4.14–5.80)
RDW: 15.5 % — ABNORMAL HIGH (ref 11.6–15.4)
WBC: 5.7 10*3/uL (ref 3.4–10.8)

## 2018-11-27 LAB — LIPID PANEL
Chol/HDL Ratio: 3.5 ratio (ref 0.0–5.0)
Cholesterol, Total: 143 mg/dL (ref 100–199)
HDL: 41 mg/dL (ref >39)
LDL Chol Calc (NIH): 84 mg/dL (ref 0–99)
Triglycerides: 97 mg/dL (ref 0–149)
VLDL Cholesterol Cal: 18 mg/dL (ref 5–40)

## 2018-11-27 LAB — BASIC METABOLIC PANEL
BUN/Creatinine Ratio: 15 (ref 10–24)
BUN: 15 mg/dL (ref 8–27)
CO2: 23 mmol/L (ref 20–29)
Calcium: 9.4 mg/dL (ref 8.6–10.2)
Chloride: 107 mmol/L — ABNORMAL HIGH (ref 96–106)
Creatinine, Ser: 0.97 mg/dL (ref 0.76–1.27)
GFR calc Af Amer: 84 mL/min/{1.73_m2} (ref >59)
GFR calc non Af Amer: 73 mL/min/{1.73_m2} (ref >59)
Glucose: 146 mg/dL — ABNORMAL HIGH (ref 65–99)
Potassium: 4.4 mmol/L (ref 3.5–5.2)
Sodium: 143 mmol/L (ref 134–144)

## 2018-11-27 LAB — HEPATIC FUNCTION PANEL
ALT: 13 IU/L (ref 0–44)
AST: 23 IU/L (ref 0–40)
Albumin: 4.3 g/dL (ref 3.6–4.6)
Alkaline Phosphatase: 75 IU/L (ref 39–117)
Bilirubin Total: 0.4 mg/dL (ref 0.0–1.2)
Bilirubin, Direct: 0.13 mg/dL (ref 0.00–0.40)
Total Protein: 7.2 g/dL (ref 6.0–8.5)

## 2018-11-27 NOTE — Telephone Encounter (Signed)
Per Katrina in lab, called [employer] @ [phone] and s/w Carloyn Manner # [phone] and specimen # [id number].  Waited on hold for 10 min, Janett Billow came back on phone and stated did not know if lines were down or no one was in office.  Mikael Spray lab # and Katrina's name will call with update on CBC that has not been resulted since 11/17.  All other labs in computer.Will send to [place] [facility] [place].

## 2018-11-27 NOTE — Telephone Encounter (Signed)
Noted.   This will need to be redrawn if not able to locate/process to recheck his anemia.   Burtis Junes, RN, [facility] [address] [place], [place]  [address] [phone]

## 2018-11-29 ENCOUNTER — Other Ambulatory Visit: Payer: Self-pay | Admitting: Cardiology

## 2018-12-01 ENCOUNTER — Other Ambulatory Visit: Payer: Self-pay | Admitting: *Deleted

## 2018-12-20 NOTE — Progress Notes (Signed)
CARDIOLOGY OFFICE NOTE  Date:  12/23/2018    Orson Ape Looman Jr. Date of Birth: 10/19/1935 Medical Record [id number]  PCP:  Leighton Ruff, MD  Cardiologist:  Serv[facility] Snare [facility]   Chief Complaint  Patient presents with  . Follow-up    History of Present Illness: Regis Mansouri. is a 83 y.o. male who presents today for a one month check.  Seen for Dr. Marlou Porch. Primarily follows with me. Has previously seen Dr. Burt Knack for structural heart.   Mr. Hibdon has a history of a prioraortic valve replacement using abioprosthetictissue valve in 2012,hypertension, persistent atrial fibrillation on long-term anticoagulation, and previous embolic stroke in [id number].  Patient's cardiac history dates back to 2012 when he underwent aortic valve replacement using a33mm Edwards Magna Easestented bovine pericardial tissue valve via right minithoracotomy approach for severe aortic insufficiency. He recovered uneventfully and was followed for a period of time by Dr. Marlou Porch but subsequently lost to follow-up. I have followed himsince 2018and previous echocardiograms document the presence of normal functioning bioprosthetic tissue valve in aortic position with mild mitral regurgitation and normal left ventricular systolic function. In October 2018 he hadan acute right middle cerebral artery stroke associated with dense left-sided hemiplegia. He was promptly treated with catheter directed therapy and he recovered remarkably well. Transthoracic echocardiogram performed at that time revealed normal left ventricular function with normal functioning bioprosthetic tissue valve in aortic position and what was felt to be moderate mitral regurgitation. The patient was notably in atrial fibrillation. Transesophageal echocardiogram was not performed but the patient was started on Eliquis for long-term anticoagulation. He recovered remarkably well from his stroke and has eventually  returned to essentially baseline physical status.He did report decreased exercise tolerance with poor endurance, and he admitted to some exertional shortness of breath with more strenuous activity. However, hecontinuedto walk every day and he exercisedreligiously, including doing push-ups and lifting some weights. Follow-up echocardiogram revealed preserved left ventricular function but worsening mitral regurgitation. He was referred to Dr. Burt Knack for consultation and underwent transesophageal echocardiogram and diagnostic cardiac catheterization on October 14, 2017. TEE revealed normal left ventricular systolic function with normal functioning bioprosthetic tissue valve in aortic position. There was severe mitral regurgitation with mitral valve prolapse including a flail segment involving the middle scallop of the posterior leaflet. There was severe left atrial enlargement. There was severe tricuspid regurgitation with bileaflet prolapse. Catheterization revealed mild nonobstructive coronary artery disease with no change in comparison with catheterization performed in 2012. There were large V waves on wedge tracing consistent with severe mitral regurgitation. Pulmonary artery pressures were mildly elevated.  Patient underwent a redo sternotomy, MV and TV repair, MAZE, and LA clip on 01/30/18. His post op course was uneventful.He did develop some residual urinary retention and required I/O catheterization. He was restarted on Flomax and Proscarfor this. He developed junctional bradycardia and was not started on a beta blocker. He was paced for the first several days post op. He was then put on VVI backup on 01/27.His underlying rhythmwas junctional with heart rate in the low 50's. After transfer from the ICU, his heart rate was increased into the 60-70's. Hewasasymptomatic, even with previous bradycardia.He was volume over loaded and diuresed. He had ABL anemia. He did not require a post  op transfusion. He was weaned off the insulin drip. He was started on Coumadin for his MV Repair and MAZE procedure. He wasdischarged home on 5mg  of Coumadin.He was able to be started onLisinopril 5 mg daily.  I then saw himin February for his post op visit- lots of concerns. Was not feeling as well as he wished or thought he should be. Was losing more weight. Was doing a lot of walkingand probably was overdoing it.At follow up in March he was doing ok. He and his wife were struggling with the need to social distance - they typically eat out most meals - cardiac status ok.   We did a telehealth visit back in May - lots of questions and concerns. Still with no stamina - not at the level that he thought he should be at but was felt to be doing pretty good. Lightheaded with position changes. He was to havea repeat MRI on his spine - there was concern at L3 of a possible lesion - he has had no known malignancy (actually had extensive CT scans for weight loss in the past).This turned out to be benign with no further imaging warranted.  I discussed his care with Dr. Burt Knack - we elected to keep him on coumadin anticoagulation given valvular disease and AF.This is actually a cheaper option for him as well.His echo had been reviewed by Dr. Roxy Manns &Dr. Burt Knack. No need for any intervention for the mitral stenosis.When seen by me back in July - was doing ok - stamina improving. He did have a GI bleed back in August - found to have gastritis and Candida - did not require transfusion - coumadin was able to be restarted. Noted to be on lifelong PPI therapy. EKG at that time showed what looked to be AF/flutter with a controlled rate.   Last visit was a month ago - he was doing ok - seemed to have some memory issues - asking same question over and over. Noted to be in atrial flutter on EKG - he felt like he was doing ok and had no symptoms. He was to have a follow up as part of his post op care with  AF clinic - he did not wish to go there. He did not wish to do anything any differently and elected to stay on his current regimen.   The patient and wife do not have symptoms concerning for COVID-19 infection (fever, chills, cough, or new shortness of breath).   Comes in today. Here with his wife today. He is doing well. Says he feels good. Tells me his BP is good at home - but with talking further - only checking about once a week. This morning it was fine at home. Will tend to be higher in the office. No palpitations, not dizzy or lightheaded. No syncope. He is exercising regularly. Wife notes he fatigues a little more easily but still very active. He has another visit for the AF clinic later this week - he does not wish to go. He is happy with how he is doing and has no real concerns today.    Past Medical History:  Diagnosis Date  . Bladder stones   . Borderline diabetes   . BPH (benign prostatic hyperplasia)   . Coronary artery disease    cardiologist-  dr Nat Math gerhart NP--- per cath 06-02-2010 non-obstructive cad pLAD 30-40%  . Diverticulosis of colon   . Dysrhythmia    afib  . Gout   . Heart murmur   . History of adenomatous polyp of colon    2002-- tubular adenoma  . History of aortic insufficiency    severe -- s/p  AVR 08-03-2010  . History of small bowel  obstruction    02/ 2007 mechanical sbo s/p  surgical intervention;  partial sbo 09/ 2011 and 03-20-2011 resolved without surgical intervention  . History of urinary retention   . HTN (hypertension)   . Peripheral neuropathy   . Persistent atrial fibrillation ([place])   . S/P aortic valve replacement with prosthetic valve 08/03/2010   tissue valve  . S/P Maze operation for atrial fibrillation 01/30/2018   Complete bilateral atrial lesion set using bipolar radiofrequency and cryothermy with clipping of LA appendage  . S/P MVR (mitral valve repair) 01/30/2018   Complex valvuloplasty including artificial Gore-tex neochord  placement x4 and [employer] Annuloflex ring annuloplasty, size 28  . S/P patent foramen ovale closure 08/03/2010   at same time AVR  . S/P tricuspid valve repair 01/30/2018   Using an MC3 Annuloplasty ring, size 28  . Stroke ([place])   . Tricuspid regurgitation     Past Surgical History:  Procedure Laterality Date  . BIOPSY  09/07/2018   Procedure: BIOPSY;  Surgeon: Otis Brace, MD;  Location: [facility];  Service: Gastroenterology;;  . CARDIAC CATHETERIZATION  06-02-2010  dr skains   non-obstructive cad- pLAD 30-40%/  normal LVSF/  severe AI  . CARDIOVASCULAR STRESS TEST  04/12/2016   Low risk nuclear perfusion study w/ no significant reversible ischemia/  normal LV function and wall motion ,  stress ef 60%/  20mm inferior and lateral scooped ST-segment depression w/ exercise (may be repolarization abnormality), exercise capacity was moderately reduced  . CATARACT EXTRACTION W/ INTRAOCULAR LENS  IMPLANT, BILATERAL  02/2010  . CLIPPING OF ATRIAL APPENDAGE N/A 01/30/2018   Procedure: CLIPPING OF LEFT ATRIAL APPENDAGE USING ATRICLIP PRO2 45MM;  Surgeon: Rexene Alberts, MD;  Location: [facility];  Service: Open Heart Surgery;  Laterality: N/A;  . COLONOSCOPY WITH PROPOFOL N/A 10/21/2018   Procedure: COLONOSCOPY WITH PROPOFOL;  Surgeon: Otis Brace, MD;  Location: [facility];  Service: Gastroenterology;  Laterality: N/A;  . CYSTOSCOPY WITH LITHOLAPAXY N/A 06/05/2016   Procedure: CYSTOSCOPY WITH LITHOLAPAXY and fulgarization of bladder neck;  Surgeon: Irine Seal, MD;  Location: [facility];  Service: Urology;  Laterality: N/A;  . ESOPHAGOGASTRODUODENOSCOPY (EGD) WITH PROPOFOL N/A 09/07/2018   Procedure: ESOPHAGOGASTRODUODENOSCOPY (EGD) WITH PROPOFOL;  Surgeon: Otis Brace, MD;  Location: [facility];  Service: Gastroenterology;  Laterality: N/A;  . EXPLORATORY LAPARTOMY /  CHOLECYSTECTOMY  02/28/2005   for Small  bowel obstruction (mechnical)  . IR ANGIO EXTRACRAN  SEL COM CAROTID INNOMINATE UNI L MOD SED  10/24/2016  . IR ANGIO VERTEBRAL SEL SUBCLAVIAN INNOMINATE BILAT MOD SED  10/24/2016  . IR PERCUTANEOUS ART THROMBECTOMY/INFUSION INTRACRANIAL INC DIAG ANGIO  10/24/2016  . IR RADIOLOGIST EVAL & MGMT  12/05/2016  . LEFT KNEE ARTHROSCOPY  2006  . MAZE N/A 01/30/2018   Procedure: MAZE;  Surgeon: Rexene Alberts, MD;  Location: [facility];  Service: Open Heart Surgery;  Laterality: N/A;  . MITRAL VALVE REPAIR N/A 01/30/2018   Procedure: MITRAL VALVE REPAIR (MVR) USING CARBOMEDICS ANNULOFLEX SIZE 28;  Surgeon: Rexene Alberts, MD;  Location: [facility];  Service: Open Heart Surgery;  Laterality: N/A;  . POLYPECTOMY  10/21/2018   Procedure: POLYPECTOMY;  Surgeon: Otis Brace, MD;  Location: [facility];  Service: Gastroenterology;;  . RADIOLOGY WITH ANESTHESIA N/A 10/24/2016   Procedure: RADIOLOGY WITH ANESTHESIA;  Surgeon: Luanne Bras, MD;  Location: [facility];  Service: Radiology;  Laterality: N/A;  . RIGHT FOOT SURGERY    . RIGHT MINIATURE ANTERIOR THORACOTOMY FOR  AORTIC VALVE REPLACEMENT AND CLOSURE PATENT FORAMEN OVALE  08-03-2010  DR Levada Schilling Magna-ease pericardial tissue valve (72mm)  . RIGHT/LEFT HEART CATH AND CORONARY ANGIOGRAPHY N/A 10/14/2017   Procedure: RIGHT/LEFT HEART CATH AND CORONARY ANGIOGRAPHY;  Surgeon: [name], MD;  Location: [facility];  Service: Cardiovascular;  Laterality: N/A;  . TEE WITHOUT CARDIOVERSION N/A 10/14/2017   Procedure: TRANSESOPHAGEAL ECHOCARDIOGRAM (TEE);  Surgeon: Josue Hector, MD;  Location: [facility];  Service: Cardiovascular;  Laterality: N/A;  . TRANSTHORACIC ECHOCARDIOGRAM  05/30/2016  dr skains   moderate  LVH ef 60-65%/  bioprothesis aortic valve present ,normal grandient and no AI /  mild MV calcification , moderate MR /  mild PR/ moderate TR/  PASP 47mmHg/ (RA denisty was identified 04-27-2016 echo) and is seen again today, this is likely a promient eustacian ridge, atrium is normal  size  . TRICUSPID VALVE REPLACEMENT N/A 01/30/2018   Procedure: TRICUSPID VALVE REPAIR USING MC3 SIZE 28;  Surgeon: Rexene Alberts, MD;  Location: [facility];  Service: Open Heart Surgery;  Laterality: N/A;     Medications: Current Meds  Medication Sig  . allopurinol (ZYLOPRIM) 300 MG tablet Take 150 mg by mouth daily.  Marland Kitchen amoxicillin (AMOXIL) 500 MG capsule Take 2,000 mg by mouth See admin instructions. Take 2000 mg by mouth 1 hour prior to dental appointment  . aspirin EC 81 MG tablet Take 81 mg by mouth daily.  Marland Kitchen atenolol (TENORMIN) 25 MG tablet Take 0.5 tablets (12.5 mg total) by mouth daily.  Marland Kitchen b complex vitamins tablet Take 1 tablet by mouth daily.  . finasteride (PROSCAR) 5 MG tablet Take 1 tablet (5 mg total) by mouth daily.  Marland Kitchen lisinopril (ZESTRIL) 5 MG tablet Take 5 mg by mouth daily.  . pantoprazole (PROTONIX) 40 MG tablet Take 1 tablet (40 mg total) by mouth daily.  . tamsulosin (FLOMAX) 0.4 MG CAPS capsule Take 2 capsules (0.8 mg total) by mouth daily after breakfast.  . warfarin (COUMADIN) 5 MG tablet TAKE 1 TABLET BY MOUTH  DAILY EXCEPT 1 AND 1/2  TABLETS BY MOUTH ON MONDAY  AND FRIDAY OR AS DIRECTED  BY ANTICOAGULATION CLINIC     Allergies: Allergies  Allergen Reactions  . Sulfa Antibiotics Other (See Comments)    Granulocytosis    Social History: The patient  reports that he has never smoked. He has never used smokeless tobacco. He reports current alcohol use. He reports that he does not use drugs.   Family History: The patient's family history includes Brain cancer in his father; Heart disease in his mother.   Review of Systems: Please see the history of present illness.   All other systems are reviewed and negative.   Physical Exam: VS:  BP (!) 180/90   Pulse (!) 56   Ht 6' (1.829 m)   Wt 176 lb (79.8 kg)   SpO2 97%   BMI 23.87 kg/m  .  BMI Body mass index is 23.87 kg/m.  Wt Readings from Last 3 Encounters:  12/23/18 176 lb (79.8 kg)  11/25/18 172 lb 1.9  oz (78.1 kg)  10/21/18 165 lb (74.8 kg)    General: Pleasant. Alert and in no acute distress. He looks good.    HEENT: Normal.  Neck: Supple, no JVD, carotid bruits, or masses noted.  Cardiac: Pretty regular on exam. Soft outflow murmur.   No edema.  Respiratory:  Lungs are clear to auscultation bilaterally with normal work of breathing.  GI:  Soft and nontender.  MS: No deformity or atrophy. Gait and ROM intact.  Skin: Warm and dry. Color is normal.  Neuro:  Strength and sensation are intact and no gross focal deficits noted.  Psych: Alert, appropriate and with normal affect.   LABORATORY DATA:  EKG:  EKG is ordered today. This demonstrates AF/flutter - controlled rate of 56.   Lab Results  Component Value Date   WBC 5.7 11/25/2018   HGB 12.7 (L) 11/25/2018   HCT 40.2 11/25/2018   PLT 157 11/25/2018   GLUCOSE 146 (H) 11/25/2018   CHOL 143 11/25/2018   TRIG 97 11/25/2018   HDL 41 11/25/2018   LDLCALC 84 11/25/2018   ALT 13 11/25/2018   AST 23 11/25/2018   NA 143 11/25/2018   K 4.4 11/25/2018   CL 107 (H) 11/25/2018   CREATININE 0.97 11/25/2018   BUN 15 11/25/2018   CO2 23 11/25/2018   TSH 2.411 09/06/2018   INR 2.1 12/23/2018   HGBA1C 5.1 09/06/2018     BNP (last 3 results) No results for input(s): BNP in the last 8760 hours.  ProBNP (last 3 results) No results for input(s): PROBNP in the last 8760 hours.   Other Studies Reviewed Today:  EchoIMPRESSIONS2/2020  1. The left ventricle has normal systolic function with an ejection fraction of 60-65%. The cavity size was normal. Left ventricular diastolic Doppler parameters are indeterminate. 2. Left atrial size was severely dilated. 3. Right atrial size was mildly dilated. 4. Mitral valve repair. Moderate mitral valve stenosis. 5. The tricuspid valve is tricuspid valve annuloplasty ring present. 6. Aortic valve regurgitation is trivial by color flow Doppler. 7. The pulmonic valve was normal in  structure. Pulmonic valve regurgitation is mild by color flow Doppler. 8. There is mild dilatation of the ascending aorta measuring 37 mm.  Note has also been reviewed by Dr. Roxy Manns -He has a 59mm ring and the anterior leaflet moves normally with no leaflet restriction. There is flow acceleration across the valve which is typical following mitral valve repair. Mean gradient was estimated 5-6 mmHg which he may notice clinically during exercise, but still correlates to calculated MVA >1.5. As active as he is the cardiac output is likely relatively high. Ventricular compliance plays a role as well. Based on the 2D views of the valve orifice it appears that the MVA would be considerably larger than 1.5 using planimetry, which makes sense given the 11mm ring. In my experience, patients who are very active and exercise strenuously often times feel like their peak exercise capacity is slow to recover after mitral valve repair, and I suspect this may be related to the sudden change from severe MR to mild functional mitral stenosis, making it more difficult to generate high cardiac output. 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 I will discuss with him when I see him.   Procedure (s)01/30/2018:  Mitral Valve Repair Redo median sternotomy Percutaneous arterial and venous cannulation Complexmitralvalvuloplasty including artificial Gore-tex neochord placement x4 Sorin [employer]omedics Annuloflex ringannuloplasty (size49mm,catalog# [id number], serial [id number])   TricuspidValve Repair Edwards mc3 ringannuloplasty (size7mm,model# [id number], serial [id number])   Maze Procedure complete bilateral atrial lesion set using bipolar radiofrequency and cryothermy ablation clipping of left atrial appendage (Atricure Pro245 left atrial clip,size 88mm) Removal of large,  anomalous muscle band traversing the right atriumby Dr. Roxy Manns on 01/30/2018   RIGHT/LEFT HEART CATH AND CORONARY ANGIOGRAPHY10/2019  Conclusion     Prox Cx to Mid Cx lesion  is 30% stenosed.  Ost LAD to Prox LAD lesion is 30% stenosed.  Prox LAD to Mid LAD lesion is 40% stenosed.  1. Nonobstructive coronary artery disease with no significant change compared to 2012 cardiac catheterization study 2. Large V waves in the pulmonary capillary wedge tracing consistent with severe mitral insufficiency  Recommend: referral to cardiac surgery for further evaluation of treatment options (surgical mitral valve repair + Maze versus percutaneous mitral valve repair)  Resume apixaban tomorrow morning     ASSESSMENT & PLAN:   1. Prior MV repair, TV repair, MAZE and clipping of LA appendage from January 2020 -on SBE - doing well.   2. Prior AVRfrom 2012 - see above.  3. Prior stroke- he is on chronic anticoagulation.   4. Persistent AF/flutter(probably related to valvular heart disease)- s/p MAZE - this was noted back while hospitalized in August - rate is controlled - we discussed options - he does not wish to have cardioversion. He does not wish to go to the AF clinic.  He is not interested in further medicines being added. He is happy with how he is doing.   5. Chronic anticoagulation - he remains on Coumadin - no problems noted - this is a more affordable option for him. Lab from last month stable.   6. HTN - BP high here today - he remains pretty adamant that he has good control at home - in talking further with him, he is only checking once a week or so - would prefer he check this more frequently.   7. ?L3 lesion - repeat MRIshowed this to be benign- no further follow up noted. Not discussed.   8. Prior GI bleed - found to have gastritis/Candida on EGD - on lifelong anticoagulation - also on lifelong PPI - his blood count has improved.   9. COVID-19  Education: The signs and symptoms of COVID-19 were discussed with the patient and how to seek care for testing (follow up with PCP or arrange E-visit).  The importance of social distancing, staying at home, hand hygiene and wearing a mask when out in public were discussed today.  Current medicines are reviewed with the patient today.  The patient does not have concerns regarding medicines other than what has been noted above.  The following changes have been made:  See above.  Labs/ tests ordered today include:    Orders Placed This Encounter  Procedures  . EKG 12-Lead     Disposition:   FU with me in about 3 months with EKG on return .  Patient is agreeable to this plan and will call if any problems develop in the interim.   SignedTruitt Merle, NP  12/23/2018 12:47 PM  [facility] [address] [place], [place]  [address] Phone: [phone] Fax: [phone]

## 2018-12-23 ENCOUNTER — Encounter: Payer: Self-pay | Admitting: Nurse Practitioner

## 2018-12-23 ENCOUNTER — Ambulatory Visit (INDEPENDENT_AMBULATORY_CARE_PROVIDER_SITE_OTHER): Payer: Medicare Other | Admitting: *Deleted

## 2018-12-23 ENCOUNTER — Other Ambulatory Visit: Payer: Self-pay

## 2018-12-23 ENCOUNTER — Ambulatory Visit (INDEPENDENT_AMBULATORY_CARE_PROVIDER_SITE_OTHER): Payer: Medicare Other | Admitting: Nurse Practitioner

## 2018-12-23 VITALS — BP 180/90 | HR 56 | Ht 72.0 in | Wt 176.0 lb

## 2018-12-23 DIAGNOSIS — Z9889 Other specified postprocedural states: Secondary | ICD-10-CM | POA: Diagnosis not present

## 2018-12-23 DIAGNOSIS — I4819 Other persistent atrial fibrillation: Secondary | ICD-10-CM

## 2018-12-23 DIAGNOSIS — I63411 Cerebral infarction due to embolism of right middle cerebral artery: Secondary | ICD-10-CM | POA: Diagnosis not present

## 2018-12-23 DIAGNOSIS — Z952 Presence of prosthetic heart valve: Secondary | ICD-10-CM

## 2018-12-23 DIAGNOSIS — I4811 Longstanding persistent atrial fibrillation: Secondary | ICD-10-CM

## 2018-12-23 DIAGNOSIS — Z5181 Encounter for therapeutic drug level monitoring: Secondary | ICD-10-CM

## 2018-12-23 DIAGNOSIS — Z7901 Long term (current) use of anticoagulants: Secondary | ICD-10-CM | POA: Diagnosis not present

## 2018-12-23 DIAGNOSIS — Z7189 Other specified counseling: Secondary | ICD-10-CM

## 2018-12-23 LAB — POCT INR: INR: 2.1 (ref 2.0–3.0)

## 2018-12-23 NOTE — Patient Instructions (Addendum)
  Description   Continue on same dosage 1 tablet daily except 1.5 tablets on Mondays and Fridays.  Recheck INR in 6 weeks. Coumadin Clinic [phone] call with bleeding problems or new medications.

## 2018-12-23 NOTE — Patient Instructions (Signed)
After Visit Summary:  We will be checking the following labs today - NONE   Medication Instructions:    Continue with your current medicines.    If you need a refill on your cardiac medications before your next appointment, please call your pharmacy.     Testing/Procedures To Be Arranged:  N/A  Follow-Up:   See me in 3 months with EKG    At [facility], you and your health needs are our priority.  As part of our continuing mission to provide you with exceptional heart care, we have created designated Provider Care Teams.  These Care Teams include your primary Cardiologist (physician) and Advanced Practice Providers (APPs -  Physician Assistants and Nurse Practitioners) who all work together to provide you with the care you need, when you need it.  Special Instructions:  . Stay safe, stay home, wash your hands for at least 20 seconds and wear a mask when out in public.  . It was good to talk with you today.    Call the [place] office at [phone] if you have any questions, problems or concerns.

## 2018-12-25 ENCOUNTER — Ambulatory Visit (HOSPITAL_COMMUNITY): Payer: Medicare Other | Admitting: Nurse Practitioner

## 2019-01-13 ENCOUNTER — Ambulatory Visit: Payer: Medicare Other | Admitting: Nurse Practitioner

## 2019-01-26 ENCOUNTER — Other Ambulatory Visit: Payer: Self-pay

## 2019-01-26 ENCOUNTER — Telehealth (INDEPENDENT_AMBULATORY_CARE_PROVIDER_SITE_OTHER): Payer: Medicare Other | Admitting: Thoracic Surgery (Cardiothoracic Vascular Surgery)

## 2019-01-26 DIAGNOSIS — Z8679 Personal history of other diseases of the circulatory system: Secondary | ICD-10-CM

## 2019-01-26 DIAGNOSIS — Z9889 Other specified postprocedural states: Secondary | ICD-10-CM | POA: Diagnosis not present

## 2019-01-26 NOTE — Progress Notes (Signed)
I have offered to send him back at my last 2 visits - he has declined both times.   Cecille Rubin

## 2019-01-26 NOTE — Patient Instructions (Signed)
Continue all previous medications without any changes at this time  

## 2019-01-26 NOTE — Progress Notes (Signed)
[address]       [place] [address]             [phone]     CARDIOTHORACIC SURGERY TELEPHONE VIRTUAL OFFICE NOTE  Referring Provider is [name], MD PCP is Leighton Ruff, MD   HPI:  I spoke with [name]. (DOB 05-Apr-1935 ) via telephone on 01/26/2019 at 12:01 PM and verified that I was speaking with the correct person using more than one form of identification.  We discussed the reason(s) for conducting our visit virtually instead of in-person.  The patient expressed understanding the circumstances and agreed to proceed as described.  Patient is an 84 year old malestatus post aortic valve replacement using abioprosthetictissue valve in 2012,hypertension, atrial fibrillation on long-term anticoagulation, and previous embolic stroke in [id number] who underwent mitral valve repair, tricuspid valve repair, and Maze procedure on January 30, 2018 for severe mitral regurgitation, tricuspid regurgitation, and recurrent persistent atrial fibrillation.    Routine follow-up echocardiogram performed March 04, 2018 revealed normal left ventricular systolic function with intact mitral valve repair and normal functioning bioprosthetic tissue valve in the aortic position.  There was no residual mitral regurgitation, mean transvalvular gradient across the mitral valve was estimated 5.7 mmHg, and mean transvalvular gradient across the aortic valve was 16 mmHg.  The patient was last seen here in our office on May 26, 2018 at which time he was doing well.  The patient has been followed carefully by Dr. Burt Knack and Truitt Merle at [facility].  Follow-up EKGs have felt to be consistent with atrial flutter with controlled ventricular rate.  The patient had previously been scheduled for follow-up appointments in the atrial fibrillation clinic but has not kept any of these appointments.  He remains chronically anticoagulated using warfarin.  He has had some problems  with iron deficient anemia and underwent EGD last August and colonoscopy last October, neither of which identified source of blood loss.  Most recent hemoglobin is back to normal.  I spoke with the patient over the telephone today.  He reports that he is doing well overall.  He states that his exercise tolerance is still not back to where he was before but overall he has no significant limitations.  He denies exertional shortness of breath.  He does not feel palpitations or chest pain.  He does not know and is not aware of whether or not his heart rate changes with exercise.  He has not had dizziness or syncope.    Current Outpatient Medications  Medication Sig Dispense Refill  . allopurinol (ZYLOPRIM) 300 MG tablet Take 150 mg by mouth daily.    Marland Kitchen amoxicillin (AMOXIL) 500 MG capsule Take 2,000 mg by mouth See admin instructions. Take 2000 mg by mouth 1 hour prior to dental appointment    . aspirin EC 81 MG tablet Take 81 mg by mouth daily.    Marland Kitchen atenolol (TENORMIN) 25 MG tablet Take 0.5 tablets (12.5 mg total) by mouth daily. 45 tablet 3  . b complex vitamins tablet Take 1 tablet by mouth daily.    . finasteride (PROSCAR) 5 MG tablet Take 1 tablet (5 mg total) by mouth daily. 30 tablet 0  . lisinopril (ZESTRIL) 5 MG tablet Take 5 mg by mouth daily.    . pantoprazole (PROTONIX) 40 MG tablet Take 1 tablet (40 mg total) by mouth daily. 90 tablet 3  . tamsulosin (FLOMAX) 0.4 MG CAPS capsule Take 2 capsules (0.8 mg total)  by mouth daily after breakfast. 30 capsule 0  . warfarin (COUMADIN) 5 MG tablet TAKE 1 TABLET BY MOUTH  DAILY EXCEPT 1 AND 1/2  TABLETS BY MOUTH ON MONDAY  AND FRIDAY OR AS DIRECTED  BY ANTICOAGULATION CLINIC 100 tablet 1   No current facility-administered medications for this visit.     Diagnostic Tests:  n/a   Impression:  Patient is clinically stable and sounds to be doing well approximately 1 year status post mitral valve repair, tricuspid valve repair, and Maze  procedure.  I have personally reviewed the patient's most recent twelve-lead EKGs which demonstrate what may be atypical atrial flutter versus ectopic atrial rhythm with occasional PVCs.   Plan:  We have not recommended any change the patient's current medications.  I discussed whether or not it might be wise for the patient to follow-up in the atrial fibrillation clinic as previously recommended.  As long as the patient remains on long-term anticoagulation and his heart rate remains under control there is no clear indication for any type of intervention, but restoration of sinus rhythm might improve his exercise tolerance.  At some point he will need routine follow-up echocardiogram performed.  All of his questions have been addressed.    I discussed limitations of evaluation and management via telephone.  The patient was advised to call back for repeat telephone consultation or to seek an in-person evaluation if questions arise or the patient's clinical condition changes in any significant manner.  I spent in excess of 10 minutes of non-face-to-face time during the conduct of this telephone virtual office consultation.     Gerald Gu. Roxy Manns, MD 01/26/2019 12:01 PM

## 2019-01-27 NOTE — Progress Notes (Signed)
Noted.   [name] 

## 2019-01-27 NOTE — Progress Notes (Signed)
S/w pt about going to AF clinic, wanted Cecille Rubin to manage pt's care.  Wants to think about AF clinic and discuss with Cecille Rubin in March for pt's upcoming appt. Will send to [place] to [place].

## 2019-02-03 ENCOUNTER — Other Ambulatory Visit: Payer: Self-pay

## 2019-02-03 ENCOUNTER — Ambulatory Visit (INDEPENDENT_AMBULATORY_CARE_PROVIDER_SITE_OTHER): Payer: Medicare Other | Admitting: *Deleted

## 2019-02-03 DIAGNOSIS — I63411 Cerebral infarction due to embolism of right middle cerebral artery: Secondary | ICD-10-CM

## 2019-02-03 DIAGNOSIS — Z7901 Long term (current) use of anticoagulants: Secondary | ICD-10-CM

## 2019-02-03 DIAGNOSIS — I4811 Longstanding persistent atrial fibrillation: Secondary | ICD-10-CM

## 2019-02-03 DIAGNOSIS — Z9889 Other specified postprocedural states: Secondary | ICD-10-CM

## 2019-02-03 LAB — POCT INR: INR: 3 (ref 2.0–3.0)

## 2019-02-03 NOTE — Patient Instructions (Signed)
Description   Continue on same dosage 1 tablet daily except 1.5 tablets on Mondays and Fridays. Have some dark green leafy veggies today and remain consistent. Recheck INR in 6 weeks. Coumadin Clinic [phone] call with bleeding problems or new medications.

## 2019-02-12 ENCOUNTER — Other Ambulatory Visit: Payer: Self-pay | Admitting: Cardiology

## 2019-02-28 ENCOUNTER — Ambulatory Visit: Payer: Medicare Other | Attending: Internal Medicine

## 2019-02-28 DIAGNOSIS — Z23 Encounter for immunization: Secondary | ICD-10-CM | POA: Insufficient documentation

## 2019-02-28 NOTE — Progress Notes (Signed)
   [id number] Vaccination [facility]  Name:  [name].    MRN: [id number] DOB: 1935-06-05  02/28/2019  [name] was observed post Covid-19 immunization for 15 minutes without incidence. He was provided with Vaccine Information Sheet and instruction to access the V-Safe system.   [name] was instructed to call 911 with any severe reactions post vaccine: Marland Kitchen Difficulty breathing  . Swelling of your face and throat  . A fast heartbeat  . A bad rash all over your body  . Dizziness and weakness    Immunizations Administered    Name Date Dose VIS Date Route   Pfizer COVID-19 Vaccine 02/28/2019  9:00 AM 0.3 mL 12/19/2018 Intramuscular   Manufacturer: [place]   Lot: [id number]   [place]: [id number]

## 2019-03-10 ENCOUNTER — Encounter: Payer: Self-pay | Admitting: Nurse Practitioner

## 2019-03-13 NOTE — Progress Notes (Signed)
CARDIOLOGY OFFICE NOTE  Date:  03/17/2019    [name]. Date of Birth: 12-Apr-1935 Medical Record [id number]  PCP:  Leighton Ruff, MD  Cardiologist:  Marisa Cyphers    Chief Complaint  Patient presents with  . Follow-up    History of Present Illness: [name]. is a 84 y.o. male who presents today for a follow up visit. Seen for Dr. Marlou Porch. Primarily follows with me. Haspreviouslyseen Dr. Burt Knack for structural heart.   [name] has a history of a prioraortic valve replacement using abioprosthetictissue valve in 2012,hypertension, persistent atrial fibrillation on long-term anticoagulation, and previous embolic stroke in [id number].  Patient's cardiac history dates back to 2012 when he underwent aortic valve replacement using a50mm Edwards Magna Easestented bovine pericardial tissue valve via right minithoracotomy approach for severe aortic insufficiency. He recovered uneventfully and was followed for a period of time by Dr. Marlou Porch but subsequently lost to follow-up. I have followed himsince 2018and previous echocardiograms document the presence of normal functioning bioprosthetic tissue valve in aortic position with mild mitral regurgitation and normal left ventricular systolic function. In October 2018 he hadan acute right middle cerebral artery stroke associated with dense left-sided hemiplegia. He was promptly treated with catheter directed therapy and he recovered remarkably well. Transthoracic echocardiogram performed at that time revealed normal left ventricular function with normal functioning bioprosthetic tissue valve in aortic position and what was felt to be moderate mitral regurgitation. The patient was notably in atrial fibrillation. Transesophageal echocardiogram was not performed but the patient was started on Eliquis for long-term anticoagulation. He recovered remarkably well from his stroke and has eventually returned to  essentially baseline physical status.He did report decreased exercise tolerance with poor endurance, and he admitted to some exertional shortness of breath with more strenuous activity. Follow-up echocardiogram revealed preserved left ventricular function but worsening mitral regurgitation. He was referred to Dr. Burt Knack for consultation and underwent transesophageal echocardiogram and diagnostic cardiac catheterization on October 14, 2017. TEE revealed normal left ventricular systolic function with normal functioning bioprosthetic tissue valve in aortic position. There was severe mitral regurgitation with mitral valve prolapse including a flail segment involving the middle scallop of the posterior leaflet. There was severe left atrial enlargement. There was severe tricuspid regurgitation with bileaflet prolapse. Catheterization revealed mild nonobstructive coronary artery disease with no change in comparison with catheterization performed in 2012. There were large V waves on wedge tracing consistent with severe mitral regurgitation. Pulmonary artery pressures were mildly elevated.  Patient underwent a redo sternotomy, MV and TV repair, MAZE, and LA clip on 01/30/18. His post op course was uneventful.He did develop some residual urinary retention and required I/O catheterization. He was restarted on Flomax and Proscarfor this. He developed junctional bradycardia and was not started on a beta blocker. He was paced for the first several days post op. He was then put on VVI backup on 01/27.His underlying rhythmwas junctional with heart rate in the low 50's. After transfer from the ICU, his heart rate was increased into the 60-70's. Hewasasymptomatic, even with previous bradycardia.He was volume over loaded and diuresed. He had ABL anemia. He did not require a post op transfusion. He was weaned off the insulin drip. He was started on Coumadin for his MV Repair and MAZE procedure.He was able  to be started onLisinopril 5 mg daily.  I followed him post op - he did well - progress was not fast enough for him - then COVID hit - the  pandemic was hard for him and his wife - they typically ate out every meal. I previously discussed his care with Dr. Burt Knack - we elected to keep him on coumadin anticoagulation given valvular disease and AF.This is actually a cheaper option for him as well.His echo had been reviewed by Dr. Roxy Manns &Dr. Burt Knack. No need for any intervention for the mitral stenosis. He did have a GI bleed back in August - found to have gastritis and Candida - did not require transfusion - coumadin was able to be restarted.Noted to be on lifelong PPI therapy.EKG at that time showed what looked to be AF/flutter with a controlled rate.   I saw him back last November and December - he has been in atrial flutter - not symptomatic - did not wish to go back to the AF clinic - discussed several times with him. This was cancelled twice.   He had a telehealth visit with Dr. Roxy Manns back in January - they discussed attempt at restoration of NSR - going back to the AF clinic - ? Hope to increase his exercise tolerance - he again, did not wish to go and opted to see me back as planned for further discussion.  The patient does not have symptoms concerning for COVID-19 infection (fever, chills, cough, or new shortness of breath).   Comes in today. Here with his wife. They are both doing ok. He notes he has had some issues with his balance - he has gotten tripped up a few times - no serious injury. Not dizzy. Not lightheaded. Breathing is ok. He will get a little winded if he over exerts himself. He is trying to take down a fence in his back yard - he works for about 15 to 20 minutes and then stops and rests. BP readings from home are fairly ok - few 140's then recheck will be down. He had CBC last month - it was good - he wants this rechecked - his PPI has been cut back. He has not had any obvious  bleeding.   Past Medical History:  Diagnosis Date  . Bladder stones   . Borderline diabetes   . BPH (benign prostatic hyperplasia)   . Coronary artery disease    cardiologist-  dr Nat Math gerhart NP--- per cath 06-02-2010 non-obstructive cad pLAD 30-40%  . Diverticulosis of colon   . Dysrhythmia    afib  . Gout   . Heart murmur   . History of adenomatous polyp of colon    2002-- tubular adenoma  . History of aortic insufficiency    severe -- s/p  AVR 08-03-2010  . History of small bowel obstruction    02/ 2007 mechanical sbo s/p  surgical intervention;  partial sbo 09/ 2011 and 03-20-2011 resolved without surgical intervention  . History of urinary retention   . HTN (hypertension)   . Peripheral neuropathy   . Persistent atrial fibrillation ([place])   . S/P aortic valve replacement with prosthetic valve 08/03/2010   tissue valve  . S/P Maze operation for atrial fibrillation 01/30/2018   Complete bilateral atrial lesion set using bipolar radiofrequency and cryothermy with clipping of LA appendage  . S/P MVR (mitral valve repair) 01/30/2018   Complex valvuloplasty including artificial Gore-tex neochord placement x4 and [employer] med[employer] Annuloflex ring annuloplasty, size 28  . S/P patent foramen ovale closure 08/03/2010   at same time AVR  . S/P tricuspid valve repair 01/30/2018   Using an MC3 Annuloplasty ring, size 28  .  Stroke ([place])   . Tricuspid regurgitation     Past Surgical History:  Procedure Laterality Date  . BIOPSY  09/07/2018   Procedure: BIOPSY;  Surgeon: Otis Brace, MD;  Location: [facility];  Service: Gastroenterology;;  . CARDIAC CATHETERIZATION  06-02-2010  dr skains   non-obstructive cad- pLAD 30-40%/  normal LVSF/  severe AI  . CARDIOVASCULAR STRESS TEST  04/12/2016   Low risk nuclear perfusion study w/ no significant reversible ischemia/  normal LV function and wall motion ,  stress ef 60%/  47mm inferior and lateral scooped ST-segment depression w/  exercise (may be repolarization abnormality), exercise capacity was moderately reduced  . CATARACT EXTRACTION W/ INTRAOCULAR LENS  IMPLANT, BILATERAL  02/2010  . CLIPPING OF ATRIAL APPENDAGE N/A 01/30/2018   Procedure: CLIPPING OF LEFT ATRIAL APPENDAGE USING ATRICLIP PRO2 45MM;  Surgeon: Rexene Alberts, MD;  Location: [facility];  Service: Open Heart Surgery;  Laterality: N/A;  . COLONOSCOPY WITH PROPOFOL N/A 10/21/2018   Procedure: COLONOSCOPY WITH PROPOFOL;  Surgeon: Otis Brace, MD;  Location: [facility];  Service: Gastroenterology;  Laterality: N/A;  . CYSTOSCOPY WITH LITHOLAPAXY N/A 06/05/2016   Procedure: CYSTOSCOPY WITH LITHOLAPAXY and fulgarization of bladder neck;  Surgeon: Irine Seal, MD;  Location: [facility];  Service: Urology;  Laterality: N/A;  . ESOPHAGOGASTRODUODENOSCOPY (EGD) WITH PROPOFOL N/A 09/07/2018   Procedure: ESOPHAGOGASTRODUODENOSCOPY (EGD) WITH PROPOFOL;  Surgeon: Otis Brace, MD;  Location: [facility];  Service: Gastroenterology;  Laterality: N/A;  . EXPLORATORY LAPARTOMY /  CHOLECYSTECTOMY  02/28/2005   for Small  bowel obstruction (mechnical)  . IR ANGIO EXTRACRAN SEL COM CAROTID INNOMINATE UNI L MOD SED  10/24/2016  . IR ANGIO VERTEBRAL SEL SUBCLAVIAN INNOMINATE BILAT MOD SED  10/24/2016  . IR PERCUTANEOUS ART THROMBECTOMY/INFUSION INTRACRANIAL INC DIAG ANGIO  10/24/2016  . IR RADIOLOGIST EVAL & MGMT  12/05/2016  . LEFT KNEE ARTHROSCOPY  2006  . MAZE N/A 01/30/2018   Procedure: MAZE;  Surgeon: Rexene Alberts, MD;  Location: [facility];  Service: Open Heart Surgery;  Laterality: N/A;  . MITRAL VALVE REPAIR N/A 01/30/2018   Procedure: MITRAL VALVE REPAIR (MVR) USING CARBOMEDICS ANNULOFLEX SIZE 28;  Surgeon: Rexene Alberts, MD;  Location: [facility];  Service: Open Heart Surgery;  Laterality: N/A;  . POLYPECTOMY  10/21/2018   Procedure: POLYPECTOMY;  Surgeon: Otis Brace, MD;  Location: [facility];  Service: Gastroenterology;;  . RADIOLOGY  WITH ANESTHESIA N/A 10/24/2016   Procedure: RADIOLOGY WITH ANESTHESIA;  Surgeon: Luanne Bras, MD;  Location: [place];  Service: Radiology;  Laterality: N/A;  . RIGHT FOOT SURGERY    . RIGHT MINIATURE ANTERIOR THORACOTOMY FOR AORTIC VALVE REPLACEMENT AND CLOSURE PATENT FORAMEN OVALE  08-03-2010  DR Levada Schilling Magna-ease pericardial tissue valve (63mm)  . RIGHT/LEFT HEART CATH AND CORONARY ANGIOGRAPHY N/A 10/14/2017   Procedure: RIGHT/LEFT HEART CATH AND CORONARY ANGIOGRAPHY;  Surgeon: [name], MD;  Location: [facility];  Service: Cardiovascular;  Laterality: N/A;  . TEE WITHOUT CARDIOVERSION N/A 10/14/2017   Procedure: TRANSESOPHAGEAL ECHOCARDIOGRAM (TEE);  Surgeon: Josue Hector, MD;  [facility];  Service: Cardiovascular;  Laterality: N/A;  . 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 TRICUSPID VALVE REPLACEMENT N/A 01/30/2018   Procedure: TRICUSPID VALVE REPAIR USING MC3 SIZE 28;  Surgeon: Rexene Alberts, MD;  Location: [facility];  Service: Open Heart Surgery;  Laterality: N/A;     Medications: Current Meds  Medication Sig  . allopurinol (ZYLOPRIM) 300 MG tablet Take 150 mg by mouth daily.  Marland Kitchen amoxicillin (AMOXIL) 500 MG capsule Take 2,000 mg by mouth See admin instructions. Take 2000 mg by mouth 1 hour prior to dental appointment  . aspirin EC 81 MG tablet Take 81 mg by mouth daily.  Marland Kitchen atenolol (TENORMIN) 25 MG tablet Take 0.5 tablets (12.5 mg total) by mouth daily.  Marland Kitchen b complex vitamins tablet Take 1 tablet by mouth daily.  . cholecalciferol (VITAMIN D3) 25 MCG (1000 UNIT) tablet Take 1,000 Units by mouth daily.  . finasteride (PROSCAR) 5 MG tablet  Take 1 tablet (5 mg total) by mouth daily.  Marland Kitchen lisinopril (ZESTRIL) 5 MG tablet Take 5 mg by mouth daily.  . pantoprazole (PROTONIX) 20 MG tablet Take 20 mg by mouth daily.  . tamsulosin (FLOMAX) 0.4 MG CAPS capsule Take 2 capsules (0.8 mg total) by mouth daily after breakfast.  . warfarin (COUMADIN) 5 MG tablet TAKE 1 TABLET BY MOUTH  DAILY EXCEPT 1 AND 1/2  TABLETS BY MOUTH ON MONDAY  AND FRIDAY OR AS DIRECTED  BY ANTICOAGULATION CLINIC     Allergies: Allergies  Allergen Reactions  . Sulfa Antibiotics Other (See Comments)    Granulocytosis    Social History: The patient  reports that he has never smoked. He has never used smokeless tobacco. He reports current alcohol use. He reports that he does not use drugs.   Family History: The patient's family history includes Brain cancer in his father; Heart disease in his mother.   Review of Systems: Please see the history of present illness.   All other systems are reviewed and negative.   Physical Exam: VS:  BP (!) 162/82   Pulse (!) 54   Ht 6' (1.829 m)   Wt 171 lb 6.4 oz (77.7 kg)   SpO2 97%   BMI 23.25 kg/m  .  BMI Body mass index is 23.25 kg/m.  Wt Readings from Last 3 Encounters:  03/17/19 171 lb 6.4 oz (77.7 kg)  12/23/18 176 lb (79.8 kg)  11/25/18 172 lb 1.9 oz (78.1 kg)    General: Pleasant. Alert and in no acute distress.   Cardiac: Irregular irregular rhythm. His rate is ok.  No edema.  Respiratory:  Lungs are clear to auscultation bilaterally with normal work of breathing.  MS: No deformity or atrophy. Gait and ROM intact.  Skin: Warm and dry. Color is normal.  Neuro:  Strength and sensation are intact and no gross focal deficits noted.  Psych: Alert, appropriate and with normal affect.   LABORATORY DATA:  EKG:  EKG is ordered today. This demonstrates AF with VR of 54 noted today.  Lab Results  Component Value Date   WBC 5.7 11/25/2018   HGB 12.7 (L) 11/25/2018   HCT 40.2 11/25/2018   PLT 157 11/25/2018     GLUCOSE 146 (H) 11/25/2018   CHOL 143 11/25/2018   TRIG 97 11/25/2018   HDL 41 11/25/2018   LDLCALC 84 11/25/2018   ALT 13 11/25/2018   AST 23 11/25/2018   NA 143 11/25/2018   K 4.4 11/25/2018   CL 107 (H) 11/25/2018   CREATININE 0.97 11/25/2018  BUN 15 11/25/2018   CO2 23 11/25/2018   TSH 2.411 09/06/2018   INR 3.0 02/03/2019   HGBA1C 5.1 09/06/2018   Lab Results  Component Value Date   INR 3.0 02/03/2019   INR 2.1 12/23/2018   INR 2.3 11/25/2018     BNP (last 3 results) No results for input(s): BNP in the last 8760 hours.  ProBNP (last 3 results) No results for input(s): PROBNP in the last 8760 hours.   Other Studies Reviewed Today:  EchoIMPRESSIONS2/2020  1. The left ventricle has normal systolic function with an ejection fraction of 60-65%. The cavity size was normal. Left ventricular diastolic Doppler parameters are indeterminate. 2. Left atrial size was severely dilated. 3. Right atrial size was mildly dilated. 4. Mitral valve repair. Moderate mitral valve stenosis. 5. The tricuspid valve is tricuspid valve annuloplasty ring present. 6. Aortic valve regurgitation is trivial by color flow Doppler. 7. The pulmonic valve was normal in structure. Pulmonic valve regurgitation is mild by color flow Doppler. 8. There is mild dilatation of the ascending aorta measuring 37 mm.  Note has also been reviewed by Dr. Roxy Manns -He has a 101mm ring and the anterior leaflet moves normally with no leaflet restriction. There is flow acceleration across the valve which is typical following mitral valve repair. Mean gradient was estimated 5-6 mmHg which he may notice clinically during exercise, but still correlates to calculated MVA >1.5. As active as he is the cardiac output is likely relatively high. Ventricular compliance plays a role as well. Based on the 2D views of the valve orifice it appears that the MVA would be considerably larger than 1.5 using planimetry,  which makes sense given the 23mm ring. In my experience, patients who are very active and exercise strenuously often times feel like their peak exercise capacity is slow to recover after mitral valve repair, and I suspect this may be related to the sudden change from severe MR to mild functional mitral stenosis, making it more difficult to generate high cardiac output. 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 I will discuss with him when I see him.   Procedure (s)01/30/2018:  Mitral Valve Repair Redo median sternotomy Percutaneous arterial and venous cannulation Complexmitralvalvuloplasty including artificial Gore-tex neochord placement x4 Sorin Carbomedics Annuloflex ringannuloplasty (size37mm,catalog# [id number], serial [id number])   TricuspidValve Repair Edwards mc3 ringannuloplasty (size71mm,model# [id number], serial [id number])   Maze Procedure complete bilateral atrial lesion set using bipolar radiofrequency and cryothermy ablation clipping of left atrial appendage (Atricure Pro245 left atrial clip,size 38mm) Removal of large, anomalous muscle band traversing the right atriumby Dr. Roxy Manns on 01/30/2018   RIGHT/LEFT HEART CATH AND CORONARY ANGIOGRAPHY10/2019  Conclusion     Prox Cx to Mid Cx lesion is 30% stenosed.  Ost LAD to Prox LAD lesion is 30% stenosed.  Prox LAD to Mid LAD lesion is 40% stenosed.  1. Nonobstructive coronary artery disease with no significant change compared to 2012 cardiac catheterization study 2. Large V waves in the pulmonary capillary wedge tracing consistent with severe mitral insufficiency  Recommend: referral to cardiac surgery for further evaluation of treatment options (surgical mitral valve repair + Maze versus percutaneous mitral valve repair)  Resume apixaban tomorrow morning      ASSESSMENT & PLAN:   1.[place] repair, TV repair, MAZE and clipping of LA appendage from January 2020-on SBE - he is felt to be doing well. Stable exercise tolerance - will get his echo updated. No changes made today. I  think he is doing well - he is staying active. No signs of heart failure.   2. Prior AVRfrom 2012- see above.  3. Prior stroke - remains on chronic anticoagulation - no problems noted - he has no residual deficits.   4. Persistent AF/flutter - with prior MAZE - has been noted since at least August - his rate is ok - he is on just low dose beta blocker - he is not symptomatic at all - does not wish to try and restore NSR. He is committed to lifelong anticoagulation.   5. HTN - BP better at home - his diary is reviewed - I have left him on his current regimen for now.   6. Chronic anticoagulation - INR today - surveillance lab today - no problems noted. Coumadin has been a more affordable option for him.   7. Prior GI bleed - recheck lab today. He had had gastritis/Candida on EGD - on chronic PPI   8. COVID-19 Education: The signs and symptoms of COVID-19 were discussed with the patient and how to seek care for testing (follow up with PCP or arrange E-visit).  The importance of social distancing, staying at home, hand hygiene and wearing a mask when out in public were discussed today. He has had his first COVID vaccine.   Current medicines are reviewed with the patient today.  The patient does not have concerns regarding medicines other than what has been noted above.  The following changes have been made:  See above.  Labs/ tests ordered today include:    Orders Placed This Encounter  Procedures  . Basic metabolic panel  . CBC  . Hepatic function panel  . Lipid panel  . EKG 12-Lead  . ECHOCARDIOGRAM COMPLETE     Disposition:   FU with me in 4 months.   Patient is agreeable to this plan and will call if any problems develop in the interim.    SignedTruitt Merle, NP  03/17/2019 10:14 AM  [facility] [address] [place], [place]  [address] Phone: [phone] Fax: [phone]

## 2019-03-17 ENCOUNTER — Other Ambulatory Visit: Payer: Self-pay

## 2019-03-17 ENCOUNTER — Encounter: Payer: Self-pay | Admitting: Nurse Practitioner

## 2019-03-17 ENCOUNTER — Ambulatory Visit (INDEPENDENT_AMBULATORY_CARE_PROVIDER_SITE_OTHER): Payer: Medicare Other | Admitting: *Deleted

## 2019-03-17 ENCOUNTER — Ambulatory Visit: Payer: Medicare Other | Admitting: Nurse Practitioner

## 2019-03-17 VITALS — BP 162/82 | HR 54 | Ht 72.0 in | Wt 171.4 lb

## 2019-03-17 DIAGNOSIS — Z7901 Long term (current) use of anticoagulants: Secondary | ICD-10-CM | POA: Diagnosis not present

## 2019-03-17 DIAGNOSIS — Z7189 Other specified counseling: Secondary | ICD-10-CM

## 2019-03-17 DIAGNOSIS — I4811 Longstanding persistent atrial fibrillation: Secondary | ICD-10-CM | POA: Diagnosis not present

## 2019-03-17 DIAGNOSIS — I4819 Other persistent atrial fibrillation: Secondary | ICD-10-CM | POA: Diagnosis not present

## 2019-03-17 DIAGNOSIS — I5032 Chronic diastolic (congestive) heart failure: Secondary | ICD-10-CM

## 2019-03-17 DIAGNOSIS — Z9889 Other specified postprocedural states: Secondary | ICD-10-CM

## 2019-03-17 DIAGNOSIS — I63411 Cerebral infarction due to embolism of right middle cerebral artery: Secondary | ICD-10-CM | POA: Diagnosis not present

## 2019-03-17 DIAGNOSIS — Z952 Presence of prosthetic heart valve: Secondary | ICD-10-CM | POA: Diagnosis not present

## 2019-03-17 DIAGNOSIS — Z5181 Encounter for therapeutic drug level monitoring: Secondary | ICD-10-CM

## 2019-03-17 LAB — BASIC METABOLIC PANEL
BUN/Creatinine Ratio: 15 (ref 10–24)
BUN: 14 mg/dL (ref 8–27)
CO2: 23 mmol/L (ref 20–29)
Calcium: 9.3 mg/dL (ref 8.6–10.2)
Chloride: 105 mmol/L (ref 96–106)
Creatinine, Ser: 0.94 mg/dL (ref 0.76–1.27)
GFR calc Af Amer: 88 mL/min/{1.73_m2} (ref >59)
GFR calc non Af Amer: 76 mL/min/{1.73_m2} (ref >59)
Glucose: 125 mg/dL — ABNORMAL HIGH (ref 65–99)
Potassium: 4.6 mmol/L (ref 3.5–5.2)
Sodium: 141 mmol/L (ref 134–144)

## 2019-03-17 LAB — HEPATIC FUNCTION PANEL
ALT: 11 IU/L (ref 0–44)
AST: 19 IU/L (ref 0–40)
Albumin: 4.3 g/dL (ref 3.6–4.6)
Alkaline Phosphatase: 84 IU/L (ref 39–117)
Bilirubin Total: 0.6 mg/dL (ref 0.0–1.2)
Bilirubin, Direct: 0.2 mg/dL (ref 0.00–0.40)
Total Protein: 7.2 g/dL (ref 6.0–8.5)

## 2019-03-17 LAB — CBC
Hematocrit: 38.9 % (ref 37.5–51.0)
Hemoglobin: 13.2 g/dL (ref 13.0–17.7)
MCH: 32.6 pg (ref 26.6–33.0)
MCHC: 33.9 g/dL (ref 31.5–35.7)
MCV: 96 fL (ref 79–97)
Platelets: 168 10*3/uL (ref 150–450)
RBC: 4.05 x10E6/uL — ABNORMAL LOW (ref 4.14–5.80)
RDW: 13.9 % (ref 11.6–15.4)
WBC: 6.3 10*3/uL (ref 3.4–10.8)

## 2019-03-17 LAB — POCT INR: INR: 2.9 (ref 2.0–3.0)

## 2019-03-17 LAB — LIPID PANEL
Chol/HDL Ratio: 3.5 ratio (ref 0.0–5.0)
Cholesterol, Total: 135 mg/dL (ref 100–199)
HDL: 39 mg/dL — ABNORMAL LOW (ref >39)
LDL Chol Calc (NIH): 79 mg/dL (ref 0–99)
Triglycerides: 91 mg/dL (ref 0–149)
VLDL Cholesterol Cal: 17 mg/dL (ref 5–40)

## 2019-03-17 NOTE — Patient Instructions (Signed)
Description   Continue on same dosage 1 tablet daily except 1.5 tablets on Mondays and Fridays. Have some dark green leafy veggies today and remain consistent. Recheck INR in 6 weeks. Coumadin Clinic [phone] call with bleeding problems or new medications.

## 2019-03-17 NOTE — Patient Instructions (Addendum)
After Visit Summary:  We will be checking the following labs today - BMET, CBC, HPF and Lipids   Medication Instructions:    Continue with your current medicines.    If you need a refill on your cardiac medications before your next appointment, please call your pharmacy.     Testing/Procedures To Be Arranged:  Echocardiogram  Follow-Up:   See me in 4 months     At [facility], you and your health needs are our priority.  As part of our continuing mission to provide you with exceptional heart care, we have created designated Provider Care Teams.  These Care Teams include your primary Cardiologist (physician) and Advanced Practice Providers (APPs -  Physician Assistants and Nurse Practitioners) who all work together to provide you with the care you need, when you need it.  Special Instructions:  . Stay safe, stay home, wash your hands for at least 20 seconds and wear a mask when out in public.  . It was good to talk with you both today.  Marland Kitchen Keep staying active . Keep a check on your BP for me   Call the [place] office at [phone] if you have any questions, problems or concerns.

## 2019-03-24 ENCOUNTER — Ambulatory Visit: Payer: Medicare Other | Attending: Internal Medicine

## 2019-03-24 DIAGNOSIS — Z23 Encounter for immunization: Secondary | ICD-10-CM

## 2019-03-24 NOTE — Progress Notes (Signed)
   [id number] Vaccination Clinic  Name:  [name].    MRN: [id number] DOB: 06/16/1935  03/24/2019  [name] was observed post Covid-19 immunization for 15 minutes without incident. He was provided with Vaccine Information Sheet and instruction to access the V-Safe system.   [name] was instructed to call 911 with any severe reactions post vaccine: Marland Kitchen Difficulty breathing  . Swelling of face and throat  . A fast heartbeat  . A bad rash all over body  . Dizziness and weakness   Immunizations Administered    Name Date Dose VIS Date Route   Pfizer COVID-19 Vaccine 03/24/2019  9:38 AM 0.3 mL 12/19/2018 Intramuscular   Manufacturer: [place]   Lot: [id number]   [place]: [id number]

## 2019-04-02 ENCOUNTER — Ambulatory Visit (HOSPITAL_COMMUNITY): Payer: Medicare Other | Attending: Cardiology

## 2019-04-02 ENCOUNTER — Other Ambulatory Visit: Payer: Self-pay

## 2019-04-02 DIAGNOSIS — Z952 Presence of prosthetic heart valve: Secondary | ICD-10-CM | POA: Diagnosis not present

## 2019-04-02 DIAGNOSIS — I4811 Longstanding persistent atrial fibrillation: Secondary | ICD-10-CM | POA: Diagnosis not present

## 2019-04-02 DIAGNOSIS — Z9889 Other specified postprocedural states: Secondary | ICD-10-CM | POA: Diagnosis not present

## 2019-04-28 ENCOUNTER — Ambulatory Visit (INDEPENDENT_AMBULATORY_CARE_PROVIDER_SITE_OTHER): Payer: Medicare Other | Admitting: *Deleted

## 2019-04-28 ENCOUNTER — Other Ambulatory Visit: Payer: Self-pay

## 2019-04-28 DIAGNOSIS — Z9889 Other specified postprocedural states: Secondary | ICD-10-CM | POA: Diagnosis not present

## 2019-04-28 DIAGNOSIS — I4811 Longstanding persistent atrial fibrillation: Secondary | ICD-10-CM

## 2019-04-28 DIAGNOSIS — Z5181 Encounter for therapeutic drug level monitoring: Secondary | ICD-10-CM | POA: Diagnosis not present

## 2019-04-28 DIAGNOSIS — I639 Cerebral infarction, unspecified: Secondary | ICD-10-CM

## 2019-04-28 DIAGNOSIS — Z7901 Long term (current) use of anticoagulants: Secondary | ICD-10-CM | POA: Diagnosis not present

## 2019-04-28 DIAGNOSIS — I63411 Cerebral infarction due to embolism of right middle cerebral artery: Secondary | ICD-10-CM

## 2019-04-28 LAB — POCT INR: INR: 2.7 (ref 2.0–3.0)

## 2019-04-28 NOTE — Patient Instructions (Signed)
Description   Continue on same dosage 1 tablet daily except 1.5 tablets on Mondays and Fridays. Recheck INR in 6 weeks. Coumadin Clinic [phone] call with bleeding problems or new medications.

## 2019-05-21 NOTE — Telephone Encounter (Signed)
error 

## 2019-05-31 ENCOUNTER — Other Ambulatory Visit: Payer: Self-pay | Admitting: Cardiology

## 2019-06-09 ENCOUNTER — Other Ambulatory Visit: Payer: Self-pay

## 2019-06-09 ENCOUNTER — Ambulatory Visit (INDEPENDENT_AMBULATORY_CARE_PROVIDER_SITE_OTHER): Payer: Medicare Other | Admitting: Pharmacist

## 2019-06-09 DIAGNOSIS — Z9889 Other specified postprocedural states: Secondary | ICD-10-CM | POA: Diagnosis not present

## 2019-06-09 DIAGNOSIS — I63411 Cerebral infarction due to embolism of right middle cerebral artery: Secondary | ICD-10-CM

## 2019-06-09 DIAGNOSIS — I4811 Longstanding persistent atrial fibrillation: Secondary | ICD-10-CM | POA: Diagnosis not present

## 2019-06-09 DIAGNOSIS — Z7901 Long term (current) use of anticoagulants: Secondary | ICD-10-CM

## 2019-06-09 LAB — POCT INR: INR: 2.2 (ref 2.0–3.0)

## 2019-06-09 NOTE — Patient Instructions (Signed)
Description   Continue on same dosage 1 tablet daily except 1.5 tablets on Mondays and Fridays. Recheck INR in 6 weeks. Coumadin Clinic [phone] call with bleeding problems or new medications.

## 2019-06-24 ENCOUNTER — Encounter: Payer: Self-pay | Admitting: Family Medicine

## 2019-07-01 ENCOUNTER — Encounter (HOSPITAL_COMMUNITY): Payer: Self-pay

## 2019-07-01 ENCOUNTER — Emergency Department (HOSPITAL_COMMUNITY)
Admission: EM | Admit: 2019-07-01 | Discharge: 2019-07-01 | Disposition: A | Discharge status: Home / Self Care | Payer: Medicare Other | Attending: Emergency Medicine | Admitting: Emergency Medicine

## 2019-07-01 ENCOUNTER — Other Ambulatory Visit: Payer: Self-pay

## 2019-07-01 DIAGNOSIS — Z79899 Other long term (current) drug therapy: Secondary | ICD-10-CM | POA: Insufficient documentation

## 2019-07-01 DIAGNOSIS — Y939 Activity, unspecified: Secondary | ICD-10-CM | POA: Insufficient documentation

## 2019-07-01 DIAGNOSIS — X58XXXA Exposure to other specified factors, initial encounter: Secondary | ICD-10-CM | POA: Insufficient documentation

## 2019-07-01 DIAGNOSIS — Z7982 Long term (current) use of aspirin: Secondary | ICD-10-CM | POA: Diagnosis not present

## 2019-07-01 DIAGNOSIS — I10 Essential (primary) hypertension: Secondary | ICD-10-CM | POA: Diagnosis not present

## 2019-07-01 DIAGNOSIS — E119 Type 2 diabetes mellitus without complications: Secondary | ICD-10-CM | POA: Diagnosis not present

## 2019-07-01 DIAGNOSIS — Z882 Allergy status to sulfonamides status: Secondary | ICD-10-CM | POA: Insufficient documentation

## 2019-07-01 DIAGNOSIS — Y999 Unspecified external cause status: Secondary | ICD-10-CM | POA: Insufficient documentation

## 2019-07-01 DIAGNOSIS — Y929 Unspecified place or not applicable: Secondary | ICD-10-CM | POA: Diagnosis not present

## 2019-07-01 DIAGNOSIS — Z7984 Long term (current) use of oral hypoglycemic drugs: Secondary | ICD-10-CM | POA: Insufficient documentation

## 2019-07-01 DIAGNOSIS — S00502A Unspecified superficial injury of oral cavity, initial encounter: Secondary | ICD-10-CM | POA: Diagnosis present

## 2019-07-01 DIAGNOSIS — S00512A Abrasion of oral cavity, initial encounter: Secondary | ICD-10-CM | POA: Diagnosis not present

## 2019-07-01 DIAGNOSIS — I2510 Atherosclerotic heart disease of native coronary artery without angina pectoris: Secondary | ICD-10-CM | POA: Insufficient documentation

## 2019-07-01 LAB — PROTIME-INR
INR: 2.8 — ABNORMAL HIGH (ref 0.8–1.2)
Prothrombin Time: 28.9 seconds — ABNORMAL HIGH (ref 11.4–15.2)

## 2019-07-01 MED ORDER — LIDOCAINE-EPINEPHRINE-TETRACAINE (LET) TOPICAL GEL
3.0000 mL | Freq: Once | TOPICAL | Status: AC
  Administered 2019-07-01: 3 mL via TOPICAL
  Filled 2019-07-01: qty 3

## 2019-07-01 NOTE — Discharge Instructions (Signed)
Drink cold water tonight. Ice cream is fine too. Resume normal diet tomorrow.

## 2019-07-01 NOTE — ED Triage Notes (Signed)
Pt bit tongue while out to dinner. Pt concerned since he is on to warfarin. Small amount of blood loss.

## 2019-07-01 NOTE — ED Provider Notes (Signed)
[facility] DEPT Provider Note   CSN: [id number] Arrival date & time: 07/01/19  2027     History Chief Complaint  Patient presents with  . Tongue Laceration    Colonel Krauser. is a 84 y.o. male.  HPI   84 year old male with bleeding from his tongue.  Onset shortly before arrival while eating dinner.  He is anticoagulated on warfarin.  He has tried controlling the bleeding with pressure without improvement.  He reports that his last INR check was therapeutic although about 2 weeks ago.  No other acute complaints.  Past Medical History:  Diagnosis Date  . Bladder stones   . Borderline diabetes   . BPH (benign prostatic hyperplasia)   . Coronary artery disease    cardiologist-  dr Nat Math gerhart NP--- per cath 06-02-2010 non-obstructive cad pLAD 30-40%  . Diverticulosis of colon   . Dysrhythmia    afib  . Gout   . Heart murmur   . History of adenomatous polyp of colon    2002-- tubular adenoma  . History of aortic insufficiency    severe -- s/p  AVR 08-03-2010  . History of small bowel obstruction    02/ 2007 mechanical sbo s/p  surgical intervention;  partial sbo 09/ 2011 and 03-20-2011 resolved without surgical intervention  . History of urinary retention   . HTN (hypertension)   . Peripheral neuropathy   . Persistent atrial fibrillation ([place])   . S/P aortic valve replacement with prosthetic valve 08/03/2010   tissue valve  . S/P Maze operation for atrial fibrillation 01/30/2018   Complete bilateral atrial lesion set using bipolar radiofrequency and cryothermy with clipping of LA appendage  . S/P MVR (mitral valve repair) 01/30/2018   Complex valvuloplasty including artificial Gore-tex neochord placement x4 and [employer] med[employer] Annuloflex ring annuloplasty, size 28  . S/P patent foramen ovale closure 08/03/2010   at same time AVR  . S/P tricuspid valve repair 01/30/2018   Using an MC3 Annuloplasty ring, size 28  . Stroke ([place])    . Tricuspid regurgitation     Patient Active Problem List   Diagnosis Date Noted  . Symptomatic anemia 09/05/2018  . Anemia due to GI blood loss 09/05/2018  . GI bleed 09/05/2018  . Long term (current) use of anticoagulants 02/10/2018  . S/P mitral valve repair  01/30/2018  . S/P tricuspid valve repair 01/30/2018  . S/P Maze operation for atrial fibrillation 01/30/2018  . Tricuspid regurgitation   . Severe mitral regurgitation 10/14/2017  . Trochanteric bursitis of right hip 11/27/2016  . History of ischemic left MCA stroke 11/27/2016  . Persistent atrial fibrillation   . Hyperglycemia   . Right middle cerebral artery stroke ([place]) 10/29/2016  . Acute embolic stroke ([place])   . Hypokalemia   . Left hemiparesis ([place])   . Dysphagia, post-stroke   . S/P AVR (aortic valve replacement)   . Benign essential HTN   . Diabetes mellitus type 2 in nonobese (H[facility])   . Benign prostatic hyperplasia   . A-fib ([place]) 10/28/2016  . CVA (cerebral vascular accident) ([place]) 10/24/2016  . Essential hypertension   . Gout   . History of small bowel obstruction   . History of BPH   . S/P AVR 08/03/2010    Past Surgical History:  Procedure Laterality Date  . BIOPSY  09/07/2018   Procedure: BIOPSY;  Surgeon: Otis Brace, MD;  Location: [facility];  Service: Gastroenterology;;  . CARDIAC CATHETERIZATION  06-02-2010  dr Marlou Porch   non-obstructive cad- pLAD 30-40%/  normal LVSF/  severe AI  . CARDIOVASCULAR STRESS TEST  04/12/2016   Low risk nuclear perfusion study w/ no significant reversible ischemia/  normal LV function and wall motion ,  stress ef 60%/  44mm inferior and lateral scooped ST-segment depression w/ exercise (may be repolarization abnormality), exercise capacity was moderately reduced  . CATARACT EXTRACTION W/ INTRAOCULAR LENS  IMPLANT, BILATERAL  02/2010  . CLIPPING OF ATRIAL APPENDAGE N/A 01/30/2018   Procedure: CLIPPING OF LEFT ATRIAL APPENDAGE USING ATRICLIP PRO2 45MM;  Surgeon:  Rexene Alberts, MD;  Location: [facility];  Service: Open Heart Surgery;  Laterality: N/A;  . COLONOSCOPY WITH PROPOFOL N/A 10/21/2018   Procedure: COLONOSCOPY WITH PROPOFOL;  Surgeon: Otis Brace, MD;  Location: [facility];  Service: Gastroenterology;  Laterality: N/A;  . CYSTOSCOPY WITH LITHOLAPAXY N/A 06/05/2016   Procedure: CYSTOSCOPY WITH LITHOLAPAXY and fulgarization of bladder neck;  Surgeon: Irine Seal, MD;  Location: [facility];  Service: Urology;  Laterality: N/A;  . ESOPHAGOGASTRODUODENOSCOPY (EGD) WITH PROPOFOL N/A 09/07/2018   Procedure: ESOPHAGOGASTRODUODENOSCOPY (EGD) WITH PROPOFOL;  Surgeon: Otis Brace, MD;  Location: [facility];  Service: Gastroenterology;  Laterality: N/A;  . EXPLORATORY LAPARTOMY /  CHOLECYSTECTOMY  02/28/2005   for Small  bowel obstruction (mechnical)  . IR ANGIO EXTRACRAN SEL COM CAROTID INNOMINATE UNI L MOD SED  10/24/2016  . IR ANGIO VERTEBRAL SEL SUBCLAVIAN INNOMINATE BILAT MOD SED  10/24/2016  . IR PERCUTANEOUS ART THROMBECTOMY/INFUSION INTRACRANIAL INC DIAG ANGIO  10/24/2016  . IR RADIOLOGIST EVAL & MGMT  12/05/2016  . LEFT KNEE ARTHROSCOPY  2006  . MAZE N/A 01/30/2018   Procedure: MAZE;  Surgeon: Rexene Alberts, MD;  Location: [facility];  Service: Open Heart Surgery;  Laterality: N/A;  . MITRAL VALVE REPAIR N/A 01/30/2018   Procedure: MITRAL VALVE REPAIR (MVR) USING CARBOMEDICS ANNULOFLEX SIZE 28;  Surgeon: Rexene Alberts, MD;  Location: [facility];  Service: Open Heart Surgery;  Laterality: N/A;  . POLYPECTOMY  10/21/2018   Procedure: POLYPECTOMY;  Surgeon: Otis Brace, MD;  Location: [facility];  Service: Gastroenterology;;  . RADIOLOGY WITH ANESTHESIA N/A 10/24/2016   Procedure: RADIOLOGY WITH ANESTHESIA;  Surgeon: Luanne Bras, MD;  Location: [facility];  Service: Radiology;  Laterality: N/A;  . RIGHT FOOT SURGERY    . RIGHT MINIATURE ANTERIOR THORACOTOMY FOR AORTIC VALVE REPLACEMENT AND CLOSURE PATENT FORAMEN OVALE   08-03-2010  DR Levada Schilling Magna-ease pericardial tissue valve (58mm)  . RIGHT/LEFT HEART CATH AND CORONARY ANGIOGRAPHY N/A 10/14/2017   Procedure: RIGHT/LEFT HEART CATH AND CORONARY ANGIOGRAPHY;  Surgeon: [name], MD;  Location: [facility];  Service: Cardiovascular;  Laterality: N/A;  . TEE WITHOUT CARDIOVERSION N/A 10/14/2017   Procedure: TRANSESOPHAGEAL ECHOCARDIOGRAM (TEE);  Surgeon: Josue Hector, MD;  Location: [facility];  Service: Cardiovascular;  Laterality: N/A;  . TRANSTHORACIC ECHOCARDIOGRAM  05/30/2016  dr skains   moderate  LVH ef 60-65%/  bioprothesis aortic valve present ,normal grandient and no AI /  mild MV calcification , moderate MR /  mild PR/ moderate TR/  PASP 81mmHg/ (RA denisty was identified 04-27-2016 echo) and is seen again today, this is likely a promient eustacian ridge, atrium is normal size  . TRICUSPID VALVE REPLACEMENT N/A 01/30/2018   Procedure: TRICUSPID VALVE REPAIR USING MC3 SIZE 28;  Surgeon: Rexene Alberts, MD;  Location: [facility];  Service: Open Heart Surgery;  Laterality: N/A;  Family History  Problem Relation Age of Onset  . Heart disease Mother   . Brain cancer Father     Social History   Tobacco Use  . Smoking status: Never Smoker  . Smokeless tobacco: Never Used  Vaping Use  . Vaping Use: Never used  Substance Use Topics  . Alcohol use: Yes    Comment: ONE OR TWO PER MONTH  . Drug use: No    Home Medications Prior to Admission medications   Medication Sig Start Date End Date Taking? Authorizing Provider  allopurinol (ZYLOPRIM) 300 MG tablet Take 150 mg by mouth daily.    [provider]  amoxicillin (AMOXIL) 500 MG capsule Take 2,000 mg by mouth See admin instructions. Take 2000 mg by mouth 1 hour prior to dental appointment    [provider]  aspirin EC 81 MG tablet Take 81 mg by mouth daily.    [provider]  atenolol (TENORMIN) 25 MG tablet Take 0.5 tablets (12.5 mg total) by  mouth daily. 10/14/18   Burtis Junes, NP  b complex vitamins tablet Take 1 tablet by mouth daily.    [provider]  cholecalciferol (VITAMIN D3) 25 MCG (1000 UNIT) tablet Take 1,000 Units by mouth daily.    [provider]  finasteride (PROSCAR) 5 MG tablet Take 1 tablet (5 mg total) by mouth daily. 11/05/16   Angiulli, Lavon Paganini, PA-C  lisinopril (ZESTRIL) 5 MG tablet Take 5 mg by mouth daily.    [provider]  pantoprazole (PROTONIX) 20 MG tablet Take 20 mg by mouth daily.    [provider]  tamsulosin (FLOMAX) 0.4 MG CAPS capsule Take 2 capsules (0.8 mg total) by mouth daily after breakfast. 11/05/16   [place], Lavon Paganini, PA-C  warfarin (COUMADIN) 5 MG tablet TAKE 1 TABLET BY MOUTH  DAILY EXCEPT 1 AND 1/2  TABLETS BY MOUTH ON MONDAY  AND FRIDAY OR AS DIRECTED  BY ANTICOAGULATION CLINIC 06/01/19   Jerline Pain, MD    Allergies    Sulfa antibiotics  Review of Systems   Review of Systems All systems reviewed and negative, other than as noted in HPI.  Physical Exam Updated Vital Signs BP (!) 221/111 (BP Location: Left Arm)   Pulse 73   Temp 98.1 F (36.7 C) (Oral)   Resp 17   Ht 6' (1.829 m)   Wt 77.1 kg   SpO2 98%   BMI 23.06 kg/m   Physical Exam Vitals and nursing note reviewed.  Constitutional:      General: He is not in acute distress.    Appearance: He is well-developed.  HENT:     Head: Normocephalic and atraumatic.     Mouth/Throat:     Comments: In the midline on the top of his tongue there is a tiny abraded area with minimal but persistent blood oozing.  Eyes:     General:        Right eye: No discharge.        Left eye: No discharge.     Conjunctiva/sclera: Conjunctivae normal.  Cardiovascular:     Rate and Rhythm: Normal rate and regular rhythm.     Heart sounds: Normal heart sounds. No murmur heard.  No friction rub. No gallop.   Pulmonary:     Effort: Pulmonary effort is normal. No respiratory distress.      Breath sounds: Normal breath sounds.  Abdominal:     General: There is no distension.  Palpations: Abdomen is soft.     Tenderness: There is no abdominal tenderness.  Musculoskeletal:        General: No tenderness.     Cervical back: Neck supple.  Skin:    General: Skin is warm and dry.  Neurological:     Mental Status: He is alert.  Psychiatric:        Behavior: Behavior normal.        Thought Content: Thought content normal.     ED Results / Procedures / Treatments   Labs (all labs ordered are listed, but only abnormal results are displayed) Labs Reviewed  PROTIME-INR - Abnormal; Notable for the following components:      Result Value   Prothrombin Time 28.9 (*)    INR 2.8 (*)    All other components within normal limits    EKG None  Radiology No results found.  Procedures Procedures (including critical care time)  Medications Ordered in ED Medications  lidocaine-EPINEPHrine-tetracaine (LET) topical gel (has no administration in time range)    ED Course  I have reviewed the triage vital signs and the nursing notes.  Pertinent labs & imaging results that were available during my care of the patient were reviewed by me and considered in my medical decision making (see chart for details).    MDM Rules/Calculators/A&P                          84yM with bleeding from his tongue. Tiny abraded area on the top of his tongue. Minimal but persistent oozing.  Area was dried and let was applied for approximately 10 minutes.  Bleeding stopped.  Advised to only drink cold liquids for the rest of the night.  I think he should be fine to resume his normal diet tomorrow morning.  This is very superficial wound and I do not expect any significant further issues.  INR therapeutic. Final Clinical Impression(s) / ED Diagnoses Final diagnoses:  Abrasion of tongue, initial encounter    Rx / DC Orders ED Discharge Orders    None       Virgel Manifold, MD 07/03/19 2310

## 2019-07-07 NOTE — Progress Notes (Signed)
CARDIOLOGY OFFICE NOTE  Date:  07/20/2019    Gerald Ape Gioffre Jr. Date of Birth: [birth year] Medical Record #[id number]  PCP:  Leighton Ruff, MD  Cardiologist:  Marisa Cyphers  Chief Complaint  Patient presents with  . Follow-up    History of Present Illness: Gerald Zambito. is a 84 y.o. male who presents today for a follow up visit. Seen for Dr. Marlou Porch. Primarily follows with me. Haspreviouslyseen Dr. Burt Knack for structural heart.   [name] has a history of a prioraortic valve replacement using abioprosthetictissue valve in 2012,hypertension,persistentatrial fibrillation on long-term anticoagulation, and previous embolic stroke in 3976.  Patient's cardiac history dates back to 2012 when he underwent aortic valve replacement using a44mm Edwards Magna Easestented bovine pericardial tissue valve via right minithoracotomy approach for severe aortic insufficiency. He recovered uneventfully and was followed for a period of time by Dr. Marlou Porch but subsequently lost to follow-up. I have followed himsince 2018and previous echocardiograms document the presence of normal functioning bioprosthetic tissue valve in aortic position with mild mitral regurgitation and normal left ventricular systolic function. In October 2018 he hadan acute right middle cerebral artery stroke associated with dense left-sided hemiplegia. He was promptly treated with catheter directed therapy and he recovered remarkably well. Transthoracic echocardiogram performed at that time revealed normal left ventricular function with normal functioning bioprosthetic tissue valve in aortic position and what was felt to be moderate mitral regurgitation. The patient was notably in atrial fibrillation. Transesophageal echocardiogram was not performed but the patient was started on Eliquis for long-term anticoagulation. He recovered remarkably well from his stroke and has eventually returned to  essentially baseline physical status.He did report decreased exercise tolerance with poor endurance, and he admitted to some exertional shortness of breath with more strenuous activity. Follow-up echocardiogram revealed preserved left ventricular function but worsening mitral regurgitation. He was referred to Dr. Burt Knack for consultation and underwent transesophageal echocardiogram and diagnostic cardiac catheterization on October 14, 2017. TEE revealed normal left ventricular systolic function with normal functioning bioprosthetic tissue valve in aortic position. There was severe mitral regurgitation with mitral valve prolapse including a flail segment involving the middle scallop of the posterior leaflet. There was severe left atrial enlargement. There was severe tricuspid regurgitation with bileaflet prolapse. Catheterization revealed mild nonobstructive coronary artery disease with no change in comparison with catheterization performed in 2012. There were large V waves on wedge tracing consistent with severe mitral regurgitation. Pulmonary artery pressures were mildly elevated.  Patient underwent a redo sternotomy, MV and TV repair, MAZE, and LA clip on 01/30/18. His post op course was uneventful.He did develop some residual urinary retention and required I/O catheterization. He was restarted on Flomax and Proscarfor this. He developed junctional bradycardia and was not started on a beta blocker. He was paced for the first several days post op. He was then put on VVI backup on 01/27.His underlying rhythmwas junctional with heart rate in the low 50's. After transfer from the ICU, his heart rate was increased into the 60-70's. Hewasasymptomatic, even with previous bradycardia.He was volume over loaded and diuresed. He had ABL anemia. He did not require a post op transfusion. He was weaned off the insulin drip. He was started on Coumadin for his MV Repair and MAZE procedure.He was able  to be started onLisinopril 5 mg daily.  I followed him post op - he did well - progress was not fast enough for him - then COVID hit - the pandemic was hard for  him and his wife - they typically ate out every meal. I previously discussed his care with Dr. Burt Knack - we elected to keep him on coumadin anticoagulation given valvular disease and AF.This is actually a cheaper option for him as well.His echo had been reviewed by Dr. Roxy Manns &Dr. Burt Knack. No need for any intervention for the mitral stenosis. He did have a GI bleed back in August - found to have gastritis and Candida - did not require transfusion - coumadin was able to be restarted.Noted to be on lifelong PPI therapy.EKG at that time showed what looked to be AF/flutter with a controlled rate. He has remained in atrial flutter - has not wished to pursue cardioversion or go back to AF clinic - he has not been felt to be symptomatic.   Last seen by me back in March - he was felt to be doing ok from our standpoint - balance was getting worse. No bleeding. PPI had been cut back.   Comes in today. Here with his wife today. BP is up here today - readings from home noted. Much better there.  He has had his coumadin checked - he stopped this for several days due to some bleeding/hematoma on his tongue - after biting into hot fish - ended up in the ER since he could not get it to stop bleeding. BP there was 221/111 - this was not addressed.  It recurred the following morning - so that's when he stopped his Coumadin for 3 days. Also noted to have had some Lovenox "on hand" - unclear as to why - but he was going to use this but did not since someone had told him it was expired. Did not seem to understand that this would still cause bleeding. Nevertheless, this is all resolved. His BP is better at home. He is walking, doing some light weights and back using his punching bag. Feels good overall. No falls. He is happy with how he is doing. His CBC last month  looked good.   Past Medical History:  Diagnosis Date  . Bladder stones   . Borderline diabetes   . BPH (benign prostatic hyperplasia)   . Coronary artery disease    cardiologist-  dr Nat Math gerhart NP--- per cath 06-02-2010 non-obstructive cad pLAD 30-40%  . Diverticulosis of colon   . Dysrhythmia    afib  . Gout   . Heart murmur   . History of adenomatous polyp of colon    2002-- tubular adenoma  . History of aortic insufficiency    severe -- s/p  AVR 08-03-2010  . History of small bowel obstruction    02/ 2007 mechanical sbo s/p  surgical intervention;  partial sbo 09/ 2011 and 03-20-2011 resolved without surgical intervention  . History of urinary retention   . HTN (hypertension)   . Peripheral neuropathy   . Persistent atrial fibrillation ([place])   . S/P aortic valve replacement with prosthetic valve 08/03/2010   tissue valve  . S/P Maze operation for atrial fibrillation 01/30/2018   Complete bilateral atrial lesion set using bipolar radiofrequency and cryothermy with clipping of LA appendage  . S/P MVR (mitral valve repair) 01/30/2018   Complex valvuloplasty including artificial Gore-tex neochord placement x4 and [employer] Annuloflex ring annuloplasty, size 28  . S/P patent foramen ovale closure 08/03/2010   at same time AVR  . S/P tricuspid valve repair 01/30/2018   Using an MC3 Annuloplasty ring, size 28  . Stroke ([place])   .  Tricuspid regurgitation     Past Surgical History:  Procedure Laterality Date  . BIOPSY  09/07/2018   Procedure: BIOPSY;  Surgeon: Otis Brace, MD;  Location: [facility];  Service: Gastroenterology;;  . CARDIAC CATHETERIZATION  06-02-2010  dr skains   non-obstructive cad- pLAD 30-40%/  normal LVSF/  severe AI  . CARDIOVASCULAR STRESS TEST  04/12/2016   Low risk nuclear perfusion study w/ no significant reversible ischemia/  normal LV function and wall motion ,  stress ef 60%/  72mm inferior and lateral scooped ST-segment depression w/  exercise (may be repolarization abnormality), exercise capacity was moderately reduced  . CATARACT EXTRACTION W/ INTRAOCULAR LENS  IMPLANT, BILATERAL  02/2010  . CLIPPING OF ATRIAL APPENDAGE N/A 01/30/2018   Procedure: CLIPPING OF LEFT ATRIAL APPENDAGE USING ATRICLIP PRO2 45MM;  Surgeon: Rexene Alberts, MD;  Location: [place];  Service: Open Heart Surgery;  Laterality: N/A;  . COLONOSCOPY WITH PROPOFOL N/A 10/21/2018   Procedure: COLONOSCOPY WITH PROPOFOL;  Surgeon: Otis Brace, MD;  Location: [facility];  Service: Gastroenterology;  Laterality: N/A;  . CYSTOSCOPY WITH LITHOLAPAXY N/A 06/05/2016   Procedure: CYSTOSCOPY WITH LITHOLAPAXY and fulgarization of bladder neck;  Surgeon: Irine Seal, MD;  Location: [facility];  Service: Urology;  Laterality: N/A;  . ESOPHAGOGASTRODUODENOSCOPY (EGD) WITH PROPOFOL N/A 09/07/2018   Procedure: ESOPHAGOGASTRODUODENOSCOPY (EGD) WITH PROPOFOL;  Surgeon: Otis Brace, MD;  Location: [facility];  Service: Gastroenterology;  Laterality: N/A;  . EXPLORATORY LAPARTOMY /  CHOLECYSTECTOMY  02/28/2005   for Small  bowel obstruction (mechnical)  . IR ANGIO EXTRACRAN SEL COM CAROTID INNOMINATE UNI L MOD SED  10/24/2016  . IR ANGIO VERTEBRAL SEL SUBCLAVIAN INNOMINATE BILAT MOD SED  10/24/2016  . IR PERCUTANEOUS ART THROMBECTOMY/INFUSION INTRACRANIAL INC DIAG ANGIO  10/24/2016  . IR RADIOLOGIST EVAL & MGMT  12/05/2016  . LEFT KNEE ARTHROSCOPY  2006  . MAZE N/A 01/30/2018   Procedure: MAZE;  Surgeon: Rexene Alberts, MD;  Location: [place];  Service: Open Heart Surgery;  Laterality: N/A;  . MITRAL VALVE REPAIR N/A 01/30/2018   Procedure: MITRAL VALVE REPAIR (MVR) USING CARBOMEDICS ANNULOFLEX SIZE 28;  Surgeon: Rexene Alberts, MD;  Location: [place];  Service: Open Heart Surgery;  Laterality: N/A;  . POLYPECTOMY  10/21/2018   Procedure: POLYPECTOMY;  Surgeon: Otis Brace, MD;  Location: [facility];  Service: Gastroenterology;;  . RADIOLOGY  WITH ANESTHESIA N/A 10/24/2016   Procedure: RADIOLOGY WITH ANESTHESIA;  Surgeon: Luanne Bras, MD;  Location: [place];  Service: Radiology;  Laterality: N/A;  . RIGHT FOOT SURGERY    . RIGHT MINIATURE ANTERIOR THORACOTOMY FOR AORTIC VALVE REPLACEMENT AND CLOSURE PATENT FORAMEN OVALE  08-03-2010  DR Levada Schilling Magna-ease pericardial tissue valve (95mm)  . RIGHT/LEFT HEART CATH AND CORONARY ANGIOGRAPHY N/A 10/14/2017   Procedure: RIGHT/LEFT HEART CATH AND CORONARY ANGIOGRAPHY;  Surgeon: [name], MD;  Location: [place];  Service: Cardiovascular;  Laterality: N/A;  . TEE WITHOUT CARDIOVERSION N/A 10/14/2017   Procedure: TRANSESOPHAGEAL ECHOCARDIOGRAM (TEE);  Surgeon: Josue Hector, MD;  Location: [facility];  Service: Cardiovascular;  Laterality: N/A;  . TRANSTHORACIC ECHOCARDIOGRAM  05/30/2016  dr skains   moderate  LVH ef 60-65%/  bioprothesis aortic valve present ,normal grandient and no AI /  mild MV calcification , moderate MR /  mild PR/ moderate TR/  PASP 82mmHg/ (RA denisty was identified 04-27-2016 echo) and is seen again today, this is likely a promient eustacian ridge, atrium is normal  size  . TRICUSPID VALVE REPLACEMENT N/A 01/30/2018   Procedure: TRICUSPID VALVE REPAIR USING MC3 SIZE 28;  Surgeon: Rexene Alberts, MD;  Location: [place];  Service: Open Heart Surgery;  Laterality: N/A;     Medications: Current Meds  Medication Sig  . allopurinol (ZYLOPRIM) 300 MG tablet Take 150 mg by mouth daily.  Marland Kitchen amoxicillin (AMOXIL) 500 MG capsule Take 2,000 mg by mouth See admin instructions. Take 2000 mg by mouth 1 hour prior to dental appointment  . aspirin EC 81 MG tablet Take 81 mg by mouth daily.  Marland Kitchen atenolol (TENORMIN) 25 MG tablet Take 0.5 tablets (12.5 mg total) by mouth daily.  Marland Kitchen b complex vitamins tablet Take 1 tablet by mouth daily.  . cholecalciferol (VITAMIN D3) 25 MCG (1000 UNIT) tablet Take 1,000 Units by mouth daily.  . finasteride (PROSCAR) 5 MG tablet  Take 1 tablet (5 mg total) by mouth daily.  Marland Kitchen lisinopril (ZESTRIL) 5 MG tablet Take 5 mg by mouth daily.  . pantoprazole (PROTONIX) 20 MG tablet Take 20 mg by mouth daily.  . tamsulosin (FLOMAX) 0.4 MG CAPS capsule Take 2 capsules (0.8 mg total) by mouth daily after breakfast.  . warfarin (COUMADIN) 5 MG tablet TAKE 1 TABLET BY MOUTH  DAILY EXCEPT 1 AND 1/2  TABLETS BY MOUTH ON MONDAY  AND FRIDAY OR AS DIRECTED  BY ANTICOAGULATION CLINIC     Allergies: Allergies  Allergen Reactions  . Sulfa Antibiotics Other (See Comments)    Granulocytosis    Social History: The patient  reports that he has never smoked. He has never used smokeless tobacco. He reports current alcohol use. He reports that he does not use drugs.   Family History: The patient's family history includes Brain cancer in his father; Heart disease in his mother.   Review of Systems: Please see the history of present illness.   All other systems are reviewed and negative.   Physical Exam: VS:  BP (!) 160/96 (BP Location: Right Arm, Patient Position: Sitting, Cuff Size: Normal)   Pulse 61   Ht 6' (1.829 m)   Wt 170 lb 9.6 oz (77.4 kg)   SpO2 98%   BMI 23.14 kg/m  .  BMI Body mass index is 23.14 kg/m.  Wt Readings from Last 3 Encounters:  07/20/19 170 lb 9.6 oz (77.4 kg)  07/01/19 170 lb (77.1 kg)  03/17/19 171 lb 6.4 oz (77.7 kg)   BP recheck by me is 130/90 by me.    General: Alert and in no acute distress.   Cardiac: Sounds pretty regular to me today. Soft murmur noted. No edema.  Respiratory:  Lungs are clear to auscultation bilaterally with normal work of breathing.  GI: Soft and nontender.  MS: No deformity or atrophy. Gait and ROM intact.  Skin: Warm and dry. Color is normal.  Neuro:  Strength and sensation are intact and no gross focal deficits noted.  Psych: Alert, appropriate and with normal affect.   LABORATORY DATA:  EKG:  EKG is not ordered today.    Lab Results  Component Value Date   WBC  6.3 03/17/2019   HGB 13.2 03/17/2019   HCT 38.9 03/17/2019   PLT 168 03/17/2019   GLUCOSE 125 (H) 03/17/2019   CHOL 135 03/17/2019   TRIG 91 03/17/2019   HDL 39 (L) 03/17/2019   LDLCALC 79 03/17/2019   ALT 11 03/17/2019   AST 19 03/17/2019   NA 141 03/17/2019   K 4.6 03/17/2019  CL 105 03/17/2019   CREATININE 0.94 03/17/2019   BUN 14 03/17/2019   CO2 23 03/17/2019   TSH 2.411 09/06/2018   INR 2.3 07/20/2019   HGBA1C 5.1 09/06/2018       BNP (last 3 results) No results for input(s): BNP in the last 8760 hours.  ProBNP (last 3 results) No results for input(s): PROBNP in the last 8760 hours.   Other Studies Reviewed Today:  ECHO IMPRESSIONS 03/2019  1. Left ventricular ejection fraction, by estimation, is 60 to 65%. The  left ventricle has normal function. The left ventricle has no regional  wall motion abnormalities. There is severe concentric left ventricular  hypertrophy. Unable to assess LV  diastolic filing pressures due to underlying atrial fibrillation'.  Elevated left ventricular end-diastolic pressure.  2. Right ventricular systolic function is normal. The right ventricular  size is mildly enlarged. There is mildly elevated pulmonary artery  systolic pressure. The estimated right ventricular systolic pressure is  87.8 mmHg.  3. Right atrial size was severely dilated.  4. Left atrial size was severely dilated.  5. The mitral valve has been repaired/replaced. Trivial mitral valve  regurgitation. Moderate mitral stenosis.  6. The aortic valve has been repaired/replaced. There is a 25 mm Magna  Ease bioprosthetic valve present in the aortic position. Aortic valve mean  gradient measures 10.4 mmHg. Aortic valve peak gradient measures 17.8  mmHg. Aortic valve area, by VTI  measures 1.89 cmAortic valve regurgitation is not visualized. No aortic  stenosis is present.  7. Aortic dilatation noted. There is mild dilatation of the aortic root  and of the  ascending aorta measuring 40 mm and 49mm respectively.  8. The tricuspid valve is has been repaired/replaced. The tricuspid valve  is status post repair with an annuloplasty ring.  9. The inferior vena cava is dilated in size with >50% respiratory  variability, suggesting right atrial pressure of 8 mmHg.  10. S/P PFO closure with no evience of shunt by colorflow doppler.    Procedure (s)01/30/2018:  Mitral Valve Repair Redo median sternotomy Percutaneous arterial and venous cannulation Complexmitralvalvuloplasty including artificial Gore-tex neochord placement x4 Sorin Carbomedics Annuloflex ringannuloplasty (size74mm,catalog# [id number], serial #[id number])   TricuspidValve Repair Edwards mc3 ringannuloplasty (size50mm,model# [id number], serial [id number])   Maze Procedure complete bilateral atrial lesion set using bipolar radiofrequency and cryothermy ablation clipping of left atrial appendage (Atricure Pro245 left atrial clip,size 107mm) Removal of large, anomalous muscle band traversing the right atriumby Dr. Roxy Manns on 01/30/2018   RIGHT/LEFT HEART CATH AND CORONARY ANGIOGRAPHY10/2019  Conclusion     Prox Cx to Mid Cx lesion is 30% stenosed.  Ost LAD to Prox LAD lesion is 30% stenosed.  Prox LAD to Mid LAD lesion is 40% stenosed.  1. Nonobstructive coronary artery disease with no significant change compared to 2012 cardiac catheterization study 2. Large V waves in the pulmonary capillary wedge tracing consistent with severe mitral insufficiency  Recommend: referral to cardiac surgery for further evaluation of treatment options (surgical mitral valve repair + Maze versus percutaneous mitral valve repair)  Resume apixaban tomorrow morning     ASSESSMENT & PLAN:   1.Prior MV repair, TV repair, MAZE and clipping of the LA appendage from January of 2020  - he remains on SBE - he has good exercise tolerance. Echo from March noted.   2. Prior AVR from 2012 - doing well.   3. Prior stroke - committed to long term anticoagulation.   4. Persistent AF/flutter - noted over the past year -  he has opted to stay on just low dose beta blocker - he has good exercise tolerance that is stable - he has not wanted to pursue other options and this has been discussed several times with him in the past. We have opted to manage with rate control and continued anticoagulation. He is feeling well and I do not think we can improve this. His EKG today is stable - reviewed with Dr. Marlou Porch as well. We will continue with Coumadin anticoagulation.   5. HTN - has much better control at home - he is to continue to follow.   6. Chronic anticoagulation - Coumadin has been more affordable for him. He has had recent bleeding from trauma to his mouth - cautioned about stopping his Coumadin without letting anyone know.   7. Prior GI bleed - had gastritis/Cadida on prior EGD - on chronic PPI.   Current medicines are reviewed with the patient today.  The patient does not have concerns regarding medicines other than what has been noted above.  The following changes have been made:  See above.  Labs/ tests ordered today include:    Orders Placed This Encounter  Procedures  . Basic metabolic panel  . EKG 12-Lead     Disposition:   FU with me in 4 months.     Patient is agreeable to this plan and will call if any problems develop in the interim.   SignedTruitt Merle, NP  07/20/2019 10:35 AM  [facility] [address] [place], [place]  [address] Phone: [phone] Fax: [phone]

## 2019-07-20 ENCOUNTER — Ambulatory Visit (INDEPENDENT_AMBULATORY_CARE_PROVIDER_SITE_OTHER): Payer: Medicare Other | Admitting: *Deleted

## 2019-07-20 ENCOUNTER — Encounter: Payer: Self-pay | Admitting: Nurse Practitioner

## 2019-07-20 ENCOUNTER — Other Ambulatory Visit: Payer: Self-pay

## 2019-07-20 ENCOUNTER — Ambulatory Visit: Payer: Medicare Other | Admitting: Nurse Practitioner

## 2019-07-20 VITALS — BP 160/96 | HR 61 | Ht 72.0 in | Wt 170.6 lb

## 2019-07-20 DIAGNOSIS — Z8679 Personal history of other diseases of the circulatory system: Secondary | ICD-10-CM

## 2019-07-20 DIAGNOSIS — Z952 Presence of prosthetic heart valve: Secondary | ICD-10-CM

## 2019-07-20 DIAGNOSIS — I63411 Cerebral infarction due to embolism of right middle cerebral artery: Secondary | ICD-10-CM | POA: Diagnosis not present

## 2019-07-20 DIAGNOSIS — I4819 Other persistent atrial fibrillation: Secondary | ICD-10-CM | POA: Diagnosis not present

## 2019-07-20 DIAGNOSIS — I10 Essential (primary) hypertension: Secondary | ICD-10-CM

## 2019-07-20 DIAGNOSIS — Z7901 Long term (current) use of anticoagulants: Secondary | ICD-10-CM

## 2019-07-20 DIAGNOSIS — I4811 Longstanding persistent atrial fibrillation: Secondary | ICD-10-CM

## 2019-07-20 DIAGNOSIS — Z9889 Other specified postprocedural states: Secondary | ICD-10-CM | POA: Diagnosis not present

## 2019-07-20 DIAGNOSIS — Z79899 Other long term (current) drug therapy: Secondary | ICD-10-CM

## 2019-07-20 LAB — BASIC METABOLIC PANEL
BUN/Creatinine Ratio: 15 (ref 10–24)
BUN: 15 mg/dL (ref 8–27)
CO2: 23 mmol/L (ref 20–29)
Calcium: 9.2 mg/dL (ref 8.6–10.2)
Chloride: 106 mmol/L (ref 96–106)
Creatinine, Ser: 0.99 mg/dL (ref 0.76–1.27)
GFR calc Af Amer: 82 mL/min/{1.73_m2} (ref >59)
GFR calc non Af Amer: 71 mL/min/{1.73_m2} (ref >59)
Glucose: 114 mg/dL — ABNORMAL HIGH (ref 65–99)
Potassium: 4.4 mmol/L (ref 3.5–5.2)
Sodium: 142 mmol/L (ref 134–144)

## 2019-07-20 LAB — POCT INR: INR: 2.3 (ref 2.0–3.0)

## 2019-07-20 NOTE — Patient Instructions (Addendum)
After Visit Summary:  We will be checking the following labs today - BMET   Medication Instructions:    Continue with your current medicines.    If you need a refill on your cardiac medications before your next appointment, please call your pharmacy.     Testing/Procedures To Be Arranged:  N/A  Follow-Up:   See me in 4 months.     At [facility], you and your health needs are our priority.  As part of our continuing mission to provide you with exceptional heart care, we have created designated Provider Care Teams.  These Care Teams include your primary Cardiologist (physician) and Advanced Practice Providers (APPs -  Physician Assistants and Nurse Practitioners) who all work together to provide you with the care you need, when you need it.  Special Instructions:  . Stay safe, wash your hands for at least 20 seconds and wear a mask when needed.  . It was good to talk with you both today.    Call the [place] office at [phone] if you have any questions, problems or concerns.

## 2019-07-20 NOTE — Patient Instructions (Signed)
Description   Continue taking Warfarin 1 tablet daily except 1.5 tablets on Mondays and Fridays. Recheck INR in 6 weeks. Coumadin Clinic [phone] call with bleeding problems or new medications.

## 2019-08-01 ENCOUNTER — Other Ambulatory Visit: Payer: Self-pay | Admitting: Nurse Practitioner

## 2019-08-31 ENCOUNTER — Other Ambulatory Visit: Payer: Self-pay

## 2019-08-31 ENCOUNTER — Ambulatory Visit (INDEPENDENT_AMBULATORY_CARE_PROVIDER_SITE_OTHER): Payer: Medicare Other | Admitting: *Deleted

## 2019-08-31 DIAGNOSIS — I63411 Cerebral infarction due to embolism of right middle cerebral artery: Secondary | ICD-10-CM

## 2019-08-31 DIAGNOSIS — I4811 Longstanding persistent atrial fibrillation: Secondary | ICD-10-CM

## 2019-08-31 DIAGNOSIS — Z7901 Long term (current) use of anticoagulants: Secondary | ICD-10-CM

## 2019-08-31 DIAGNOSIS — Z9889 Other specified postprocedural states: Secondary | ICD-10-CM | POA: Diagnosis not present

## 2019-08-31 LAB — POCT INR: INR: 2.2 (ref 2.0–3.0)

## 2019-08-31 NOTE — Patient Instructions (Addendum)
Description   Continue taking Warfarin 1 tablet daily except 1.5 tablets on Mondays and Fridays. Recheck INR in 7 weeks. Coumadin Clinic [phone] call with bleeding problems or new medications.      You can use Tylenol for pain, avoid aspirin products.

## 2019-10-19 ENCOUNTER — Other Ambulatory Visit: Payer: Self-pay

## 2019-10-19 ENCOUNTER — Ambulatory Visit (INDEPENDENT_AMBULATORY_CARE_PROVIDER_SITE_OTHER): Payer: Medicare Other

## 2019-10-19 DIAGNOSIS — I63411 Cerebral infarction due to embolism of right middle cerebral artery: Secondary | ICD-10-CM

## 2019-10-19 DIAGNOSIS — Z9889 Other specified postprocedural states: Secondary | ICD-10-CM

## 2019-10-19 DIAGNOSIS — I4811 Longstanding persistent atrial fibrillation: Secondary | ICD-10-CM

## 2019-10-19 DIAGNOSIS — Z7901 Long term (current) use of anticoagulants: Secondary | ICD-10-CM

## 2019-10-19 LAB — POCT INR: INR: 1.7 — AB (ref 2.0–3.0)

## 2019-10-19 NOTE — Patient Instructions (Signed)
Description   Take an extra 1/2 tablet today, then resume same dosage of Warfarin 1 tablet daily except 1.5 tablets on Mondays and Fridays. Recheck INR in 4 weeks when seeing Truitt Merle. Coumadin Clinic [phone] call with bleeding problems or new medications.

## 2019-10-24 ENCOUNTER — Other Ambulatory Visit: Payer: Self-pay | Admitting: Nurse Practitioner

## 2019-10-24 ENCOUNTER — Other Ambulatory Visit: Payer: Self-pay | Admitting: Cardiology

## 2019-10-24 DIAGNOSIS — I4811 Longstanding persistent atrial fibrillation: Secondary | ICD-10-CM

## 2019-10-24 DIAGNOSIS — Z9889 Other specified postprocedural states: Secondary | ICD-10-CM

## 2019-11-11 NOTE — Progress Notes (Signed)
CARDIOLOGY OFFICE NOTE  Date:  11/25/2019    Gerald Ape Klemens Jr. Date of Birth: 08-Apr-1935 Medical Record #[id number]  PCP:  Leighton Ruff, MD  Cardiologist:  Marisa Cyphers    Chief Complaint  Patient presents with  . Follow-up    History of Present Illness: [name]. is a 84 y.o. male who presents today for a follow up visit. Seen for Dr. Marlou Porch. Primarily follows with me. Haspreviouslyseen Dr. Burt Knack for structural heart.   Mr. Word has a history of a prioraortic valve replacement using abioprosthetictissue valve in 2012,hypertension,persistentatrial fibrillation on long-term anticoagulation, and previous embolic stroke in 5621.  Patient's cardiac history dates back to 2012 when he underwent aortic valve replacement using a62mm Edwards Magna Easestented bovine pericardial tissue valve via right minithoracotomy approach for severe aortic insufficiency. He recovered uneventfully and was followed for a period of time by Dr. Marlou Porch but subsequently lost to follow-up. I have followed himsince 2018and previous echocardiograms document the presence of normal functioning bioprosthetic tissue valve in aortic position with mild mitral regurgitation and normal left ventricular systolic function. In October 2018 he hadan acute right middle cerebral artery stroke associated with dense left-sided hemiplegia. He was promptly treated with catheter directed therapy and he recovered remarkably well. Transthoracic echocardiogram performed at that time revealed normal left ventricular function with normal functioning bioprosthetic tissue valve in aortic position and what was felt to be moderate mitral regurgitation. The patient was notably in atrial fibrillation. Transesophageal echocardiogram was not performed but the patient was started on Eliquis for long-term anticoagulation. He recovered remarkably well from his stroke and has eventually returned to  essentially baseline physical status.He did report decreased exercise tolerance with poor endurance, and he admitted to some exertional shortness of breath with more strenuous activity. Follow-up echocardiogram revealed preserved left ventricular function but worsening mitral regurgitation. He was referred to Dr. Burt Knack for consultation and underwent transesophageal echocardiogram and diagnostic cardiac catheterization on October 14, 2017. TEE revealed normal left ventricular systolic function with normal functioning bioprosthetic tissue valve in aortic position. There was severe mitral regurgitation with mitral valve prolapse including a flail segment involving the middle scallop of the posterior leaflet. There was severe left atrial enlargement. There was severe tricuspid regurgitation with bileaflet prolapse. Catheterization revealed mild nonobstructive coronary artery disease with no change in comparison with catheterization performed in 2012. There were large V waves on wedge tracing consistent with severe mitral regurgitation. Pulmonary artery pressures were mildly elevated.  Patient underwent a redo sternotomy, MV and TV repair, MAZE, and LA clip on 01/30/18. His post op course was uneventful.He did develop some residual urinary retention and required I/O catheterization. He was restarted on Flomax and Proscarfor this. He developed junctional bradycardia and was not started on a beta blocker. He was paced for the first several days post op. He was then put on VVI backup on 01/27.His underlying rhythmwas junctional with heart rate in the low 50's. After transfer from the ICU, his heart rate was increased into the 60-70's. Hewasasymptomatic, even with previous bradycardia.He was volume over loaded and diuresed. He had ABL anemia. He did not require a post op transfusion. He was weaned off the insulin drip. He was started on Coumadin for his MV Repair and MAZE procedure.He was able  to be started onLisinopril 5 mg daily.  I followed him post op - he did well - progress was not fast enough for him - then COVID hit - the pandemic was  hard for him and his wife - they typically ate out every meal. I previouslydiscussed his care with Dr. Burt Knack - we elected to keep him on coumadin anticoagulation given valvular disease and AF.This is actually a cheaper option for him as well.His echo had been reviewed by Dr. Roxy Manns &Dr. Burt Knack. No need for any intervention for the mitral stenosis. He did have a GI bleed back in August - found to have gastritis and Candida - did not require transfusion - coumadin was able to be restarted.Noted to be on lifelong PPI therapy.EKG at that time showed what looked to be AF/flutter with a controlled rate. He has remained in atrial flutter - has not wished to pursue cardioversion or go back to AF clinic - he has not been felt to be symptomatic.   He has done ok since - but balance is getting worse. PPI has been cut back. Last seen by me in July - doing ok - had had bleeding/hematoma on his tongue - after biting into hot fish - ended up in the ER. BP 221/111 which was not addressed. He did fine after this - was happy with how he was doing. He has claimed that BP typically better at home.   Comes in today. Here alone today. He feels like he is doing ok - BP typically better ok at home. Always tends to run higher here. No chest pain. Not short of breath. He is happy with how he is doing. He is active. He is exercising regularly. His balance is off - he has had some falls - no serious injury. No passing out. BP readings from home are good. He needs labs today.   Past Medical History:  Diagnosis Date  . Bladder stones   . Borderline diabetes   . BPH (benign prostatic hyperplasia)   . Coronary artery disease    cardiologist-  dr Nat Math gerhart NP--- per cath 06-02-2010 non-obstructive cad pLAD 30-40%  . Diverticulosis of colon   . Dysrhythmia     afib  . Gout   . Heart murmur   . History of adenomatous polyp of colon    2002-- tubular adenoma  . History of aortic insufficiency    severe -- s/p  AVR 08-03-2010  . History of small bowel obstruction    02/ 2007 mechanical sbo s/p  surgical intervention;  partial sbo 09/ 2011 and 03-20-2011 resolved without surgical intervention  . History of urinary retention   . HTN (hypertension)   . Peripheral neuropathy   . Persistent atrial fibrillation ([place])   . S/P aortic valve replacement with prosthetic valve 08/03/2010   tissue valve  . S/P Maze operation for atrial fibrillation 01/30/2018   Complete bilateral atrial lesion set using bipolar radiofrequency and cryothermy with clipping of LA appendage  . S/P MVR (mitral valve repair) 01/30/2018   Complex valvuloplasty including artificial Gore-tex neochord placement x4 and [employer] medics Annuloflex ring annuloplasty, size 28  . S/P patent foramen ovale closure 08/03/2010   at same time AVR  . S/P tricuspid valve repair 01/30/2018   Using an MC3 Annuloplasty ring, size 28  . Stroke ([place])   . Tricuspid regurgitation     Past Surgical History:  Procedure Laterality Date  . BIOPSY  09/07/2018   Procedure: BIOPSY;  Surgeon: Otis Brace, MD;  Location: [facility];  Service: Gastroenterology;;  . CARDIAC CATHETERIZATION  06-02-2010  dr skains   non-obstructive cad- pLAD 30-40%/  normal LVSF/  severe AI  .  CARDIOVASCULAR STRESS TEST  04/12/2016   Low risk nuclear perfusion study w/ no significant reversible ischemia/  normal LV function and wall motion ,  stress ef 60%/  64mm inferior and lateral scooped ST-segment depression w/ exercise (may be repolarization abnormality), exercise capacity was moderately reduced  . CATARACT EXTRACTION W/ INTRAOCULAR LENS  IMPLANT, BILATERAL  02/2010  . CLIPPING OF ATRIAL APPENDAGE N/A 01/30/2018   Procedure: CLIPPING OF LEFT ATRIAL APPENDAGE USING ATRICLIP PRO2 45MM;  Surgeon: Rexene Alberts, MD;   Location: [place];  Service: Open Heart Surgery;  Laterality: N/A;  . COLONOSCOPY WITH PROPOFOL N/A 10/21/2018   Procedure: COLONOSCOPY WITH PROPOFOL;  Surgeon: Otis Brace, MD;  Location: [facility];  Service: Gastroenterology;  Laterality: N/A;  . CYSTOSCOPY WITH LITHOLAPAXY N/A 06/05/2016   Procedure: CYSTOSCOPY WITH LITHOLAPAXY and fulgarization of bladder neck;  Surgeon: Irine Seal, MD;  Location[facility];  Service: Urology;  Laterality: N/A;  . ESOPHAGOGASTRODUODENOSCOPY (EGD) WITH PROPOFOL N/A 09/07/2018   Procedure: ESOPHAGOGASTRODUODENOSCOPY (EGD) WITH PROPOFOL;  Surgeon: Otis Brace, MD;  Location: [facility];  Service: Gastroenterology;  Laterality: N/A;  . EXPLORATORY LAPARTOMY /  CHOLECYSTECTOMY  02/28/2005   for Small  bowel obstruction (mechnical)  . IR ANGIO EXTRACRAN SEL COM CAROTID INNOMINATE UNI L MOD SED  10/24/2016  . IR ANGIO VERTEBRAL SEL SUBCLAVIAN INNOMINATE BILAT MOD SED  10/24/2016  . IR PERCUTANEOUS ART THROMBECTOMY/INFUSION INTRACRANIAL INC DIAG ANGIO  10/24/2016  . IR RADIOLOGIST EVAL & MGMT  12/05/2016  . LEFT KNEE ARTHROSCOPY  2006  . MAZE N/A 01/30/2018   Procedure: MAZE;  Surgeon: Rexene Alberts, MD;  Location: [place];  Service: Open Heart Surgery;  Laterality: N/A;  . MITRAL VALVE REPAIR N/A 01/30/2018   Procedure: MITRAL VALVE REPAIR (MVR) USING CARBOMEDICS ANNULOFLEX SIZE 28;  Surgeon: Rexene Alberts, MD;  Location: [place];  Service: Open Heart Surgery;  Laterality: N/A;  . POLYPECTOMY  10/21/2018   Procedure: POLYPECTOMY;  Surgeon: Otis Brace, MD;  Location: [facility];  Service: Gastroenterology;;  . RADIOLOGY WITH ANESTHESIA N/A 10/24/2016   Procedure: RADIOLOGY WITH ANESTHESIA;  Surgeon: Luanne Bras, MD;  Location: [place];  Service: Radiology;  Laterality: N/A;  . RIGHT FOOT SURGERY    . RIGHT MINIATURE ANTERIOR THORACOTOMY FOR AORTIC VALVE REPLACEMENT AND CLOSURE PATENT FORAMEN OVALE  08-03-2010  DR Levada Schilling Magna-ease pericardial tissue valve (60mm)  . RIGHT/LEFT HEART CATH AND CORONARY ANGIOGRAPHY N/A 10/14/2017   Procedure: RIGHT/LEFT HEART CATH AND CORONARY ANGIOGRAPHY;  Surgeon: [name], MD;  Location: [place] [facility];  Service: Cardiovascular;  Laterality: N/A;  . TEE WITHOUT CARDIOVERSION N/A 10/14/2017   Procedure: TRANSESOPHAGEAL ECHOCARDIOGRAM (TEE);  Surgeon: Josue Hector, MD;  [facility];  Service: Cardiovascular;  Laterality: N/A;  . TRANSTHORACIC ECHOCARDIOGRAM  05/30/2016  dr skains   moderate  LVH ef 60-65%/  bioprothesis aortic valve present ,normal grandient and no AI /  mild MV calcification , moderate MR /  mild PR/ moderate TR/  PASP 24mmHg/ (RA denisty was identified 04-27-2016 echo) and is seen again today, this is likely a promient eustacian ridge, atrium is normal size  . TRICUSPID VALVE REPLACEMENT N/A 01/30/2018   Procedure: TRICUSPID VALVE REPAIR USING MC3 SIZE 28;  Surgeon: Rexene Alberts, MD;  Location: [place];  Service: Open Heart Surgery;  Laterality: N/A;     Medications: Current Meds  Medication Sig  . allopurinol (ZYLOPRIM) 300 MG tablet Take 150 mg  by mouth daily.  Marland Kitchen amoxicillin (AMOXIL) 500 MG capsule Take 2,000 mg by mouth See admin instructions. Take 2000 mg by mouth 1 hour prior to dental appointment  . aspirin EC 81 MG tablet Take 81 mg by mouth daily.  Marland Kitchen atenolol (TENORMIN) 25 MG tablet TAKE ONE-HALF TABLET BY  MOUTH DAILY  . b complex vitamins tablet Take 1 tablet by mouth daily.  . cholecalciferol (VITAMIN D3) 25 MCG (1000 UNIT) tablet Take 1,000 Units by mouth daily.  . finasteride (PROSCAR) 5 MG tablet Take 1 tablet (5 mg total) by mouth daily.  Marland Kitchen latanoprost (XALATAN) 0.005 % ophthalmic solution Place 1 drop into both eyes at bedtime.  Marland Kitchen lisinopril (ZESTRIL) 5 MG tablet TAKE 1 TABLET BY MOUTH  DAILY  . pantoprazole (PROTONIX) 20 MG tablet Take 20 mg by mouth daily.  . tamsulosin (FLOMAX) 0.4 MG CAPS capsule Take 2  capsules (0.8 mg total) by mouth daily after breakfast.  . warfarin (COUMADIN) 5 MG tablet TAKE 1-1.5 TABLETS BY MOUTH  DAILY OR AS DIRECTED BY  ANTICOAGULATION CLINIC     Allergies: Allergies  Allergen Reactions  . Sulfa Antibiotics Other (See Comments)    Granulocytosis    Social History: The patient  reports that he has never smoked. He has never used smokeless tobacco. He reports current alcohol use. He reports that he does not use drugs.   Family History: The patient's family history includes Brain cancer in his father; Heart disease in his mother.   Review of Systems: Please see the history of present illness.   All other systems are reviewed and negative.   Physical Exam: VS:  BP (!) 164/80   Pulse 70   Ht 6' (1.829 m)   Wt 177 lb 9.6 oz (80.6 kg)   SpO2 96%   BMI 24.09 kg/m  .  BMI Body mass index is 24.09 kg/m.  Wt Readings from Last 3 Encounters:  11/25/19 177 lb 9.6 oz (80.6 kg)  07/20/19 170 lb 9.6 oz (77.4 kg)  07/01/19 170 lb (77.1 kg)    General: Pleasant. Alert and in no acute distress.   Cardiac: Regular rate and rhythm. Soft outflow murmur.  No edema.  Respiratory:  Lungs are clear to auscultation bilaterally with normal work of breathing.  GI: Soft and nontender.  MS: No deformity or atrophy. Gait and ROM intact.  Skin: Warm and dry. Color is normal.  Neuro:  Strength and sensation are intact and no gross focal deficits noted.  Psych: Alert, appropriate and with normal affect.   LABORATORY DATA:  EKG:  EKG is not ordered today.    Lab Results  Component Value Date   WBC 6.3 03/17/2019   HGB 13.2 03/17/2019   HCT 38.9 03/17/2019   PLT 168 03/17/2019   GLUCOSE 114 (H) 07/20/2019   CHOL 135 03/17/2019   TRIG 91 03/17/2019   HDL 39 (L) 03/17/2019   LDLCALC 79 03/17/2019   ALT 11 03/17/2019   AST 19 03/17/2019   NA 142 07/20/2019   K 4.4 07/20/2019   CL 106 07/20/2019   CREATININE 0.99 07/20/2019   BUN 15 07/20/2019   CO2 23  07/20/2019   TSH 2.411 09/06/2018   INR 3.8 (A) 11/25/2019   HGBA1C 5.1 09/06/2018    Lab Results  Component Value Date   INR 3.8 (A) 11/25/2019   INR 1.7 (A) 10/19/2019   INR 2.2 08/31/2019     BNP (last 3 results) No results for input(s): BNP  in the last 8760 hours.  ProBNP (last 3 results) No results for input(s): PROBNP in the last 8760 hours.   Other Studies Reviewed Today:  ECHO IMPRESSIONS 03/2019  1. Left ventricular ejection fraction, by estimation, is 60 to 65%. The  left ventricle has normal function. The left ventricle has no regional  wall motion abnormalities. There is severe concentric left ventricular  hypertrophy. Unable to assess LV  diastolic filing pressures due to underlying atrial fibrillation'.  Elevated left ventricular end-diastolic pressure.  2. Right ventricular systolic function is normal. The right ventricular  size is mildly enlarged. There is mildly elevated pulmonary artery  systolic pressure. The estimated right ventricular systolic pressure is  93.2 mmHg.  3. Right atrial size was severely dilated.  4. Left atrial size was severely dilated.  5. The mitral valve has been repaired/replaced. Trivial mitral valve  regurgitation. Moderate mitral stenosis.  6. The aortic valve has been repaired/replaced. There is a 25 mm Magna  Ease bioprosthetic valve present in the aortic position. Aortic valve mean  gradient measures 10.4 mmHg. Aortic valve peak gradient measures 17.8  mmHg. Aortic valve area, by VTI  measures 1.89 cmAortic valve regurgitation is not visualized. No aortic  stenosis is present.  7. Aortic dilatation noted. There is mild dilatation of the aortic root  and of the ascending aorta measuring 40 mm and 34mm respectively.  8. The tricuspid valve is has been repaired/replaced. The tricuspid valve  is status post repair with an annuloplasty ring.  9. The inferior vena cava is dilated in size with >50% respiratory    variability, suggesting right atrial pressure of 8 mmHg.  10. S/P PFO closure with no evience of shunt by colorflow doppler.    Procedure (s)01/30/2018:  Mitral Valve Repair Redo median sternotomy Percutaneous arterial and venous cannulation Complexmitralvalvuloplasty including artificial Gore-tex neochord placement x4 Sorin Carbomedics Annuloflex ringannuloplasty (size74mm,catalog# [id number], serial #[id number])   TricuspidValve Repair Edwards mc3 ringannuloplasty (size30mm,model# [id number], serial [id number])   Maze Procedure complete bilateral atrial lesion set using bipolar radiofrequency and cryothermy ablation clipping of left atrial appendage (Atricure Pro245 left atrial clip,size 80mm) Removal of large, anomalous muscle band traversing the right atriumby Dr. Roxy Manns on 01/30/2018   RIGHT/LEFT HEART CATH AND CORONARY ANGIOGRAPHY10/2019  Conclusion     Prox Cx to Mid Cx lesion is 30% stenosed.  Ost LAD to Prox LAD lesion is 30% stenosed.  Prox LAD to Mid LAD lesion is 40% stenosed.  1. Nonobstructive coronary artery disease with no significant change compared to 2012 cardiac catheterization study 2. Large V waves in the pulmonary capillary wedge tracing consistent with severe mitral insufficiency  Recommend: referral to cardiac surgery for further evaluation of treatment options (surgical mitral valve repair + Maze versus percutaneous mitral valve repair)  Resume apixaban tomorrow morning     ASSESSMENT & PLAN:   1. Prior MV repair, TV repair, MAZE and clipping of the LA appendage from January of 2020 - doing ok. Remains on SBE. Has stable and good exercise tolerance that he is happy about.  Will get his echo updated in March and then see back for discussion.   2. Prior AVR from 2012 - doing well.   3. Prior stroke - committed to long term  anticoagulation.   4. Persistent AF/flutter - rate is controlled - he has opted to stay on low dose beta blocker - his exercise tolerance is stable - he has opted to not attempt restoration to NSR. We talked about this again  today - he is really not symptomatic and not feeling bad - he is committed to long term anticoagulation regardless - he continues to opt for rate control and anticoagulation.   5. HTN - typically has better control at home and higher here - he will continue to monitor - no changes made today.   6. Chronic anticoagulation - Coumadin has been more affordable for him - no bleeding but has had some falls.   7. Prior Gi bleed - had gastritis/Candida on prior EGD - he is on chronic PPI - his counts have returned to normal - no reports of bleeding - rechecking his lab today.   Current medicines are reviewed with the patient today.  The patient does not have concerns regarding medicines other than what has been noted above.  The following changes have been made:  See above.  Labs/ tests ordered today include:    Orders Placed This Encounter  Procedures  . Basic metabolic panel  . CBC  . Hepatic function panel  . Lipid panel  . ECHOCARDIOGRAM COMPLETE     Disposition:   FU with [name], PA with Dr. Burt Knack per Mr. [facility] request - I have talked with Dr. Marlou Porch who is in agreement. Mr. Moorhouse is aware that I am leaving in February.   Patient is agreeable to this plan and will call if any problems develop in the interim.   SignedTruitt Merle, NP  11/25/2019 10:34 AM  [facility] [address] [place], [place]  [address] Phone: [phone] Fax: [phone]

## 2019-11-25 ENCOUNTER — Ambulatory Visit (INDEPENDENT_AMBULATORY_CARE_PROVIDER_SITE_OTHER): Payer: Medicare Other

## 2019-11-25 ENCOUNTER — Ambulatory Visit: Payer: Medicare Other | Admitting: Nurse Practitioner

## 2019-11-25 ENCOUNTER — Other Ambulatory Visit: Payer: Self-pay

## 2019-11-25 ENCOUNTER — Encounter: Payer: Self-pay | Admitting: Nurse Practitioner

## 2019-11-25 VITALS — BP 164/80 | HR 70 | Ht 72.0 in | Wt 177.6 lb

## 2019-11-25 DIAGNOSIS — Z9889 Other specified postprocedural states: Secondary | ICD-10-CM

## 2019-11-25 DIAGNOSIS — Z7901 Long term (current) use of anticoagulants: Secondary | ICD-10-CM

## 2019-11-25 DIAGNOSIS — I63411 Cerebral infarction due to embolism of right middle cerebral artery: Secondary | ICD-10-CM | POA: Diagnosis not present

## 2019-11-25 DIAGNOSIS — I4811 Longstanding persistent atrial fibrillation: Secondary | ICD-10-CM | POA: Diagnosis not present

## 2019-11-25 DIAGNOSIS — Z952 Presence of prosthetic heart valve: Secondary | ICD-10-CM

## 2019-11-25 DIAGNOSIS — I4819 Other persistent atrial fibrillation: Secondary | ICD-10-CM | POA: Diagnosis not present

## 2019-11-25 LAB — CBC
Hematocrit: 38.1 % (ref 37.5–51.0)
Hemoglobin: 12.9 g/dL — ABNORMAL LOW (ref 13.0–17.7)
MCH: 32.7 pg (ref 26.6–33.0)
MCHC: 33.9 g/dL (ref 31.5–35.7)
MCV: 97 fL (ref 79–97)
Platelets: 135 10*3/uL — ABNORMAL LOW (ref 150–450)
RBC: 3.95 x10E6/uL — ABNORMAL LOW (ref 4.14–5.80)
RDW: 13.1 % (ref 11.6–15.4)
WBC: 5.7 10*3/uL (ref 3.4–10.8)

## 2019-11-25 LAB — HEPATIC FUNCTION PANEL
ALT: 12 IU/L (ref 0–44)
AST: 19 IU/L (ref 0–40)
Albumin: 4.3 g/dL (ref 3.6–4.6)
Alkaline Phosphatase: 77 IU/L (ref 44–121)
Bilirubin Total: 0.4 mg/dL (ref 0.0–1.2)
Bilirubin, Direct: 0.13 mg/dL (ref 0.00–0.40)
Total Protein: 7.1 g/dL (ref 6.0–8.5)

## 2019-11-25 LAB — LIPID PANEL
Chol/HDL Ratio: 3.8 ratio (ref 0.0–5.0)
Cholesterol, Total: 146 mg/dL (ref 100–199)
HDL: 38 mg/dL — ABNORMAL LOW (ref >39)
LDL Chol Calc (NIH): 85 mg/dL (ref 0–99)
Triglycerides: 128 mg/dL (ref 0–149)
VLDL Cholesterol Cal: 23 mg/dL (ref 5–40)

## 2019-11-25 LAB — BASIC METABOLIC PANEL
BUN/Creatinine Ratio: 17 (ref 10–24)
BUN: 17 mg/dL (ref 8–27)
CO2: 25 mmol/L (ref 20–29)
Calcium: 9.2 mg/dL (ref 8.6–10.2)
Chloride: 107 mmol/L — ABNORMAL HIGH (ref 96–106)
Creatinine, Ser: 0.98 mg/dL (ref 0.76–1.27)
GFR calc Af Amer: 83 mL/min/{1.73_m2} (ref >59)
GFR calc non Af Amer: 72 mL/min/{1.73_m2} (ref >59)
Glucose: 105 mg/dL — ABNORMAL HIGH (ref 65–99)
Potassium: 4.6 mmol/L (ref 3.5–5.2)
Sodium: 142 mmol/L (ref 134–144)

## 2019-11-25 LAB — POCT INR: INR: 3.8 — AB (ref 2.0–3.0)

## 2019-11-25 NOTE — Patient Instructions (Addendum)
After Visit Summary:  We will be checking the following labs today - BMET, CBC, HPF, Lipids   Medication Instructions:    Continue with your current medicines.    If you need a refill on your cardiac medications before your next appointment, please call your pharmacy.     Testing/Procedures To Be Arranged:  Echocardiogram in March  Follow-Up:   See [name], PA in March after the echocardiogram    At [facility], you and your health needs are our priority.  As part of our continuing mission to provide you with exceptional heart care, we have created designated Provider Care Teams.  These Care Teams include your primary Cardiologist (physician) and Advanced Practice Providers (APPs -  Physician Assistants and Nurse Practitioners) who all work together to provide you with the care you need, when you need it.  Special Instructions:  . Stay safe, wash your hands for at least 20 seconds and wear a mask when needed.  . It was good to talk with you today.    Call the [place] office at [phone] if you have any questions, problems or concerns.

## 2019-11-25 NOTE — Patient Instructions (Signed)
Description   Skip today's dosage of Warfarin, then resume same dosage of Warfarin 1 tablet daily except 1.5 tablets on Mondays and Fridays. Resume your green leafy vegetable intake. Recheck INR in 2 weeks. Coumadin Clinic [phone] call with bleeding problems or new medications.

## 2019-11-26 ENCOUNTER — Other Ambulatory Visit: Payer: Self-pay | Admitting: *Deleted

## 2019-11-26 DIAGNOSIS — Z7901 Long term (current) use of anticoagulants: Secondary | ICD-10-CM

## 2019-12-11 ENCOUNTER — Ambulatory Visit (INDEPENDENT_AMBULATORY_CARE_PROVIDER_SITE_OTHER): Payer: Medicare Other | Admitting: *Deleted

## 2019-12-11 ENCOUNTER — Other Ambulatory Visit: Payer: Self-pay

## 2019-12-11 DIAGNOSIS — Z7901 Long term (current) use of anticoagulants: Secondary | ICD-10-CM

## 2019-12-11 DIAGNOSIS — Z9889 Other specified postprocedural states: Secondary | ICD-10-CM

## 2019-12-11 DIAGNOSIS — I4811 Longstanding persistent atrial fibrillation: Secondary | ICD-10-CM

## 2019-12-11 DIAGNOSIS — I63411 Cerebral infarction due to embolism of right middle cerebral artery: Secondary | ICD-10-CM | POA: Diagnosis not present

## 2019-12-11 LAB — POCT INR: INR: 3 (ref 2.0–3.0)

## 2019-12-11 NOTE — Patient Instructions (Signed)
Description   Continue taking Warfarin 1 tablet daily except 1.5 tablets on Mondays and Fridays. Resume your green leafy vegetable intake. Recheck INR in 3 weeks. Coumadin Clinic [phone] call with bleeding problems or new medications.

## 2019-12-29 ENCOUNTER — Ambulatory Visit (INDEPENDENT_AMBULATORY_CARE_PROVIDER_SITE_OTHER): Payer: Medicare Other | Admitting: *Deleted

## 2019-12-29 ENCOUNTER — Other Ambulatory Visit: Payer: Medicare Other

## 2019-12-29 ENCOUNTER — Other Ambulatory Visit: Payer: Self-pay

## 2019-12-29 DIAGNOSIS — Z7901 Long term (current) use of anticoagulants: Secondary | ICD-10-CM

## 2019-12-29 DIAGNOSIS — I4811 Longstanding persistent atrial fibrillation: Secondary | ICD-10-CM

## 2019-12-29 DIAGNOSIS — Z9889 Other specified postprocedural states: Secondary | ICD-10-CM

## 2019-12-29 DIAGNOSIS — I63411 Cerebral infarction due to embolism of right middle cerebral artery: Secondary | ICD-10-CM

## 2019-12-29 LAB — CBC WITH DIFFERENTIAL/PLATELET
Basophils Absolute: 0 10*3/uL (ref 0.0–0.2)
Basos: 1 %
EOS (ABSOLUTE): 0.2 10*3/uL (ref 0.0–0.4)
Eos: 3 %
Hematocrit: 39.6 % (ref 37.5–51.0)
Hemoglobin: 13.4 g/dL (ref 13.0–17.7)
Immature Grans (Abs): 0 10*3/uL (ref 0.0–0.1)
Immature Granulocytes: 0 %
Lymphocytes Absolute: 1.7 10*3/uL (ref 0.7–3.1)
Lymphs: 28 %
MCH: 32.4 pg (ref 26.6–33.0)
MCHC: 33.8 g/dL (ref 31.5–35.7)
MCV: 96 fL (ref 79–97)
Monocytes Absolute: 0.7 10*3/uL (ref 0.1–0.9)
Monocytes: 11 %
Neutrophils Absolute: 3.4 10*3/uL (ref 1.4–7.0)
Neutrophils: 57 %
Platelets: 153 10*3/uL (ref 150–450)
RBC: 4.13 x10E6/uL — ABNORMAL LOW (ref 4.14–5.80)
RDW: 13.1 % (ref 11.6–15.4)
WBC: 6 10*3/uL (ref 3.4–10.8)

## 2019-12-29 LAB — POCT INR: INR: 2.8 (ref 2.0–3.0)

## 2019-12-29 NOTE — Patient Instructions (Signed)
Description   Continue taking Warfarin 1 tablet daily except 1.5 tablets on Mondays and Fridays. Resume your green leafy vegetable intake. Recheck INR in 4 weeks. Coumadin Clinic [phone] call with bleeding problems or new medications.

## 2020-01-12 ENCOUNTER — Other Ambulatory Visit: Payer: Self-pay | Admitting: Cardiology

## 2020-01-12 DIAGNOSIS — D649 Anemia, unspecified: Secondary | ICD-10-CM | POA: Diagnosis not present

## 2020-01-12 DIAGNOSIS — D500 Iron deficiency anemia secondary to blood loss (chronic): Secondary | ICD-10-CM | POA: Diagnosis not present

## 2020-01-12 DIAGNOSIS — I10 Essential (primary) hypertension: Secondary | ICD-10-CM | POA: Diagnosis not present

## 2020-01-12 DIAGNOSIS — I4891 Unspecified atrial fibrillation: Secondary | ICD-10-CM | POA: Diagnosis not present

## 2020-01-12 DIAGNOSIS — I5032 Chronic diastolic (congestive) heart failure: Secondary | ICD-10-CM | POA: Diagnosis not present

## 2020-01-14 DIAGNOSIS — H40023 Open angle with borderline findings, high risk, bilateral: Secondary | ICD-10-CM | POA: Diagnosis not present

## 2020-01-14 DIAGNOSIS — H26493 Other secondary cataract, bilateral: Secondary | ICD-10-CM | POA: Diagnosis not present

## 2020-01-14 DIAGNOSIS — H43813 Vitreous degeneration, bilateral: Secondary | ICD-10-CM | POA: Diagnosis not present

## 2020-01-14 DIAGNOSIS — H53453 Other localized visual field defect, bilateral: Secondary | ICD-10-CM | POA: Diagnosis not present

## 2020-02-03 ENCOUNTER — Other Ambulatory Visit: Payer: Self-pay

## 2020-02-03 ENCOUNTER — Ambulatory Visit (INDEPENDENT_AMBULATORY_CARE_PROVIDER_SITE_OTHER): Payer: Medicare Other | Admitting: Pharmacist

## 2020-02-03 DIAGNOSIS — Z7901 Long term (current) use of anticoagulants: Secondary | ICD-10-CM

## 2020-02-03 DIAGNOSIS — Z9889 Other specified postprocedural states: Secondary | ICD-10-CM

## 2020-02-03 DIAGNOSIS — I4811 Longstanding persistent atrial fibrillation: Secondary | ICD-10-CM

## 2020-02-03 DIAGNOSIS — I63411 Cerebral infarction due to embolism of right middle cerebral artery: Secondary | ICD-10-CM | POA: Diagnosis not present

## 2020-02-03 LAB — POCT INR: INR: 3.3 — AB (ref 2.0–3.0)

## 2020-02-03 NOTE — Patient Instructions (Signed)
Description   Skip Warfarin today. Then, continue taking Warfarin 1 tablet daily except 1.5 tablets on Mondays and Fridays. Resume your green leafy vegetable intake. Recheck INR in 4 weeks. Coumadin Clinic [phone] call with bleeding problems or new medications.

## 2020-03-02 ENCOUNTER — Ambulatory Visit (INDEPENDENT_AMBULATORY_CARE_PROVIDER_SITE_OTHER): Payer: Medicare Other | Admitting: *Deleted

## 2020-03-02 ENCOUNTER — Other Ambulatory Visit: Payer: Self-pay

## 2020-03-02 DIAGNOSIS — Z9889 Other specified postprocedural states: Secondary | ICD-10-CM | POA: Diagnosis not present

## 2020-03-02 DIAGNOSIS — I63411 Cerebral infarction due to embolism of right middle cerebral artery: Secondary | ICD-10-CM | POA: Diagnosis not present

## 2020-03-02 DIAGNOSIS — Z7901 Long term (current) use of anticoagulants: Secondary | ICD-10-CM

## 2020-03-02 DIAGNOSIS — I4811 Longstanding persistent atrial fibrillation: Secondary | ICD-10-CM | POA: Diagnosis not present

## 2020-03-02 LAB — POCT INR: INR: 3.3 — AB (ref 2.0–3.0)

## 2020-03-02 NOTE — Patient Instructions (Signed)
Description   Do not take any Warfarin then start taking Warfarin 1 tablet daily except 1.5 tablets on Mondays. Resume your green leafy vegetable intake. Recheck INR in 2 weeks with Echo Appt. Coumadin Clinic [phone] call with bleeding problems or new medications.

## 2020-03-11 DIAGNOSIS — I10 Essential (primary) hypertension: Secondary | ICD-10-CM | POA: Diagnosis not present

## 2020-03-11 DIAGNOSIS — D649 Anemia, unspecified: Secondary | ICD-10-CM | POA: Diagnosis not present

## 2020-03-11 DIAGNOSIS — D500 Iron deficiency anemia secondary to blood loss (chronic): Secondary | ICD-10-CM | POA: Diagnosis not present

## 2020-03-11 DIAGNOSIS — I4891 Unspecified atrial fibrillation: Secondary | ICD-10-CM | POA: Diagnosis not present

## 2020-03-11 DIAGNOSIS — I5032 Chronic diastolic (congestive) heart failure: Secondary | ICD-10-CM | POA: Diagnosis not present

## 2020-03-16 ENCOUNTER — Ambulatory Visit (HOSPITAL_COMMUNITY): Payer: Medicare Other | Attending: Cardiology

## 2020-03-16 ENCOUNTER — Ambulatory Visit (INDEPENDENT_AMBULATORY_CARE_PROVIDER_SITE_OTHER): Payer: Medicare Other | Admitting: *Deleted

## 2020-03-16 ENCOUNTER — Encounter: Payer: Self-pay | Admitting: Cardiology

## 2020-03-16 ENCOUNTER — Other Ambulatory Visit: Payer: Self-pay

## 2020-03-16 DIAGNOSIS — I4811 Longstanding persistent atrial fibrillation: Secondary | ICD-10-CM

## 2020-03-16 DIAGNOSIS — Z5181 Encounter for therapeutic drug level monitoring: Secondary | ICD-10-CM | POA: Diagnosis not present

## 2020-03-16 DIAGNOSIS — Z9889 Other specified postprocedural states: Secondary | ICD-10-CM | POA: Diagnosis not present

## 2020-03-16 DIAGNOSIS — Z952 Presence of prosthetic heart valve: Secondary | ICD-10-CM | POA: Diagnosis not present

## 2020-03-16 DIAGNOSIS — Z7901 Long term (current) use of anticoagulants: Secondary | ICD-10-CM

## 2020-03-16 DIAGNOSIS — I77819 Aortic ectasia, unspecified site: Secondary | ICD-10-CM | POA: Insufficient documentation

## 2020-03-16 DIAGNOSIS — I63411 Cerebral infarction due to embolism of right middle cerebral artery: Secondary | ICD-10-CM

## 2020-03-16 LAB — ECHOCARDIOGRAM COMPLETE
AR max vel: 1.75 cm2
AV Area VTI: 1.81 cm2
AV Area mean vel: 1.8 cm2
AV Mean grad: 15 mmHg
AV Peak grad: 27.8 mmHg
Ao pk vel: 2.64 m/s
Area-P 1/2: 1.6 cm2
MV VTI: 1.44 cm2
S' Lateral: 2.7 cm

## 2020-03-16 LAB — POCT INR: INR: 2.8 (ref 2.0–3.0)

## 2020-03-16 NOTE — Patient Instructions (Addendum)
Description   Continue  taking Warfarin 1 tablet daily except 1.5 tablets on Mondays. Resume your green leafy vegetable intake. Recheck INR when you see [name] on 3/23. Coumadin Clinic [phone] call with bleeding problems or new medications.

## 2020-03-19 ENCOUNTER — Other Ambulatory Visit: Payer: Self-pay | Admitting: Cardiology

## 2020-03-29 NOTE — Progress Notes (Signed)
Cardiology Office Note:    Date:  03/30/2020   ID:  Gerald Amas., DOB 07-06-1935, MRN [id number]  PCP:  Ayers, Gerald, [place]  Cardiologist:  Gerald Furbish, MD   Advanced Practice Provider:  Liliane Shi, PA-C Electrophysiologist:  None       Referring MD: Gerald Ruff, MD   Chief Complaint:  Follow-up (AFib, Valve Disease )    Patient Profile:    [name]. is a 85 y.o. male with:   Aortic insufficiency   S/p bioprosthetic AVR and Patent Foramen Ovale closure in 2012  Severe MR, severe TR  S/p MV Repair, TV Repair, MAZE, LA clipping in 01/2018  Hypertension   Permanent atrial fibrillation/flutter   Chronic anticoagulation w Warfarin   Hx of R MCA CVA tx with cath based Rx  Coronary artery disease   Non-obstructive by cath in 10/19  Hx of GI bleed   Borderline diabetes mellitus   BPH  Peripheral neuropathy  Prior CV studies: Echocardiogram 03/16/20 EF 60-65, no RWMA, mod LVH, GLS -20.3%, normal RVSF, RVSP 35.3, severe BAE, normally functioning MV repair (mean 6 mmHg, mild MR), TV repair ok with mean 2 mmHg, AVR ok with mean 16 mmHg, DI 0.5, aortic root 43 mm, ascending aorta 41 mm, s/p PFO closure - no residual shunt  Carotid US 01/20/2018 R ICA without stenosis; L 1-39  RIGHT/LEFT HEART CATH AND CORONARY ANGIOGRAPHY 10/14/2017 Narrative  Prox Cx to Mid Cx lesion is 30% stenosed.  Ost LAD to Prox LAD lesion is 30% stenosed.  Prox LAD to Mid LAD lesion is 40% stenosed.     History of Present Illness:    [name] was last seen by Gerald Merle, NP in 11/21.  He returns for f/u.  He has been followed in the past by [name] but has seen [name] exclusively for the last several years.  Today, he notes he is doing well.  He does have times where he feels lightheaded or dizzy.  He has not had syncope.  He has had one episode of shortness of breath walking up a hill.  He has not had chest  discomfort, orthopnea, leg edema.      Past Medical History:  Diagnosis Date  . Acquired dilation of ascending aorta and aortic root (HCC)    76mm aortic root and 27mm ascending aorta on echo 03/2020  . Bladder stones   . Borderline diabetes   . BPH (benign prostatic hyperplasia)   . Coronary artery disease    cardiologist-  dr Gerald Math gerhart NP--- per cath 06-02-2010 non-obstructive cad pLAD 30-40%  . Diverticulosis of colon   . Dysrhythmia    afib  . Gout   . Heart murmur   . History of adenomatous polyp of colon    2002-- tubular adenoma  . History of aortic insufficiency    severe -- s/p  AVR 08-03-2010  . History of small bowel obstruction    02/ 2007 mechanical sbo s/p  surgical intervention;  partial sbo 09/ 2011 and 03-20-2011 resolved without surgical intervention  . History of urinary retention   . HTN (hypertension)   . Peripheral neuropathy   . Persistent atrial fibrillation ([place])   . S/P aortic valve replacement with prosthetic valve 08/03/2010   tissue valve  . S/P Maze operation for atrial fibrillation 01/30/2018   Complete bilateral atrial lesion set using bipolar radiofrequency and cryothermy with clipping of LA  appendage  . S/P MVR (mitral valve repair) 01/30/2018   Complex valvuloplasty including artificial Gore-tex neochord placement x4 and Carbo medics Annuloflex ring annuloplasty, size 28  . S/P patent foramen ovale closure 08/03/2010   at same time AVR  . S/P tricuspid valve repair 01/30/2018   Using an MC3 Annuloplasty ring, size 28  . Stroke ([place])   . Tricuspid regurgitation     Current Medications: Current Meds  Medication Sig  . allopurinol (ZYLOPRIM) 300 MG tablet Take 150 mg by mouth daily.  Marland Kitchen amoxicillin (AMOXIL) 500 MG capsule Take 2,000 mg by mouth See admin instructions. Take 2000 mg by mouth 1 hour prior to dental appointment  . aspirin EC 81 MG tablet Take 81 mg by mouth daily.  Marland Kitchen b complex vitamins tablet Take 1 tablet by mouth  daily.  . cholecalciferol (VITAMIN D3) 25 MCG (1000 UNIT) tablet Take 1,000 Units by mouth daily.  . Cinnamon 500 MG capsule Take 500 mg by mouth daily.  . ferrous sulfate 325 (65 FE) MG EC tablet Take 325 mg by mouth daily with breakfast.  . finasteride (PROSCAR) 5 MG tablet Take 1 tablet (5 mg total) by mouth daily.  Marland Kitchen latanoprost (XALATAN) 0.005 % ophthalmic solution Place 1 drop into both eyes at bedtime.  Marland Kitchen lisinopril (ZESTRIL) 5 MG tablet TAKE 1 TABLET BY MOUTH  DAILY  . tamsulosin (FLOMAX) 0.4 MG CAPS capsule Take 2 capsules (0.8 mg total) by mouth daily after breakfast.  . warfarin (COUMADIN) 5 MG tablet TAKE 1 TO 1 AND 1/2 TABLETS BY MOUTH DAILY OR AS  DIRECTED BY ANTICOAGULATION CLINIC  . [DISCONTINUED] atenolol (TENORMIN) 25 MG tablet TAKE ONE-HALF TABLET BY  MOUTH DAILY     Allergies:   Sulfa antibiotics   Social History   Tobacco Use  . Smoking status: Never Smoker  . Smokeless tobacco: Never Used  Vaping Use  . Vaping Use: Never used  Substance Use Topics  . Alcohol use: Yes    Comment: ONE OR TWO PER MONTH  . Drug use: No     Family Hx: The patient's family history includes Brain cancer in his father; Heart disease in his mother.  Review of Systems  Gastrointestinal: Negative for hematochezia and melena.  Genitourinary: Negative for hematuria.     EKGs/Labs/Other Test Reviewed:    EKG:  EKG is   ordered today.  The ekg ordered today demonstrates atrial fibrillation with slow ventricular response, HR 55, normal axis, septal Q waves, nonspecific ST-T wave changes, QTC 455, similar to prior tracing  Recent Labs: 11/25/2019: ALT 12; BUN 17; Creatinine, Ser 0.98; Potassium 4.6; Sodium 142 12/29/2019: Hemoglobin 13.4; Platelets 153   Recent Lipid Panel Lab Results  Component Value Date/Time   CHOL 146 11/25/2019 10:40 AM   TRIG 128 11/25/2019 10:40 AM   HDL 38 (L) 11/25/2019 10:40 AM   CHOLHDL 3.8 11/25/2019 10:40 AM   CHOLHDL 3.3 10/25/2016 03:17 AM    LDLCALC 85 11/25/2019 10:40 AM      Risk Assessment/Calculations:    CHA2DS2-VASc Score = 6  This indicates a 9.7% annual risk of stroke. The patient's score is based upon: CHF History: No HTN History: Yes Diabetes History: No Stroke History: Yes Vascular Disease History: Yes Age Score: 2 Gender Score: 0     Physical Exam:    VS:  BP 140/80   Pulse (!) 55   Ht 6' (1.829 m)   Wt 180 lb (81.6 kg)   SpO2 97%  BMI 24.41 kg/m     Wt Readings from Last 3 Encounters:  03/30/20 180 lb (81.6 kg)  11/25/19 177 lb 9.6 oz (80.6 kg)  07/20/19 170 lb 9.6 oz (77.4 kg)     Constitutional:      Appearance: Healthy appearance. Not in distress.  Neck:     Vascular: JVD normal.  Pulmonary:     Effort: Pulmonary effort is normal.     Breath sounds: No wheezing. No rales.  Cardiovascular:     Normal rate. Regular rhythm. Normal S1. Normal S2.     Murmurs: There is a grade 2/6 crescendo-decrescendo systolic murmur at the URSB.  Edema:    Peripheral edema absent.  Abdominal:     Palpations: Abdomen is soft. There is no hepatomegaly.  Skin:    General: Skin is warm and dry.  Neurological:     General: No focal deficit present.     Mental Status: Alert and oriented to person, place and time.     Cranial Nerves: Cranial nerves are intact.       ASSESSMENT & PLAN:    1. H/O valvular heart disease 2. S/P AVR 3. S/P mitral valve repair 4. S/P tricuspid valve repair History of bioprosthetic AVR in 2012 and mitral valve repair, tricuspid valve repair, maze and LAA clipping in 2020.  Most recent echocardiogram demonstrated normally functioning AVR and stable tricuspid valve and mitral valve repairs.  Continue SBE prophylaxis.    5. Permanent atrial fibrillation (HCC) Ventricular rate is slow.  He does have dizziness at times.  I am concerned he is symptomatic with this.  Discontinue atenolol.  Continue warfarin.  Arrange 3-day ZIO monitor to assess heart rate control and rule out  significant pauses.  He has a hx of GI bleeding but no recent symptoms of bleeding.  Obtain f/u BMET, CBC.  Follow-up in 3 months.  6. Coronary artery disease involving native coronary artery of native heart without angina pectoris Nonobstructive coronary artery disease by cardiac catheterization in 2019.  He is not having anginal symptoms.  The guidelines have recently been updated.  I will review these further.  I do not think he needs to continue taking aspirin in addition to warfarin.  I will confirm this and let him know.  7. Essential hypertension Blood pressures at home have been optimal.  His blood pressure is usually elevated in the office.  I have asked him to continue to monitor his blood pressure and notify us if his readings start to increase after stopping atenolol.   8. Dilated aortic root 43 mm on recent echocardiogram.  He will need a follow-up echocardiogram in 1 year.      Dispo:  Return in about 3 months (around 06/30/2020) for Routine Follow Up, w/ [name], PA-C, in person.   Medication Adjustments/Labs and Tests Ordered: Current medicines are reviewed at length with the patient today.  Concerns regarding medicines are outlined above.  Tests Ordered: Orders Placed This Encounter  Procedures  . Basic metabolic panel  . CBC  . LONG TERM MONITOR (3-14 DAYS)  . EKG 12-Lead  . ECHOCARDIOGRAM COMPLETE   Medication Changes: No orders of the defined types were placed in this encounter.   Signed, [name], PA-C  03/30/2020 11:10 AM    [facility] [place], [place], [place]  [address] Phone: [phone]; Fax: [phone]

## 2020-03-30 ENCOUNTER — Other Ambulatory Visit: Payer: Self-pay

## 2020-03-30 ENCOUNTER — Ambulatory Visit (INDEPENDENT_AMBULATORY_CARE_PROVIDER_SITE_OTHER): Payer: Medicare Other

## 2020-03-30 ENCOUNTER — Ambulatory Visit: Payer: Medicare Other | Admitting: Physician Assistant

## 2020-03-30 ENCOUNTER — Encounter: Payer: Self-pay | Admitting: Physician Assistant

## 2020-03-30 ENCOUNTER — Encounter: Payer: Self-pay | Admitting: *Deleted

## 2020-03-30 VITALS — BP 140/80 | HR 55 | Ht 72.0 in | Wt 180.0 lb

## 2020-03-30 DIAGNOSIS — Z8679 Personal history of other diseases of the circulatory system: Secondary | ICD-10-CM | POA: Diagnosis not present

## 2020-03-30 DIAGNOSIS — I10 Essential (primary) hypertension: Secondary | ICD-10-CM

## 2020-03-30 DIAGNOSIS — Z9889 Other specified postprocedural states: Secondary | ICD-10-CM

## 2020-03-30 DIAGNOSIS — I77810 Thoracic aortic ectasia: Secondary | ICD-10-CM

## 2020-03-30 DIAGNOSIS — I4821 Permanent atrial fibrillation: Secondary | ICD-10-CM | POA: Diagnosis not present

## 2020-03-30 DIAGNOSIS — Z952 Presence of prosthetic heart valve: Secondary | ICD-10-CM

## 2020-03-30 DIAGNOSIS — I4811 Longstanding persistent atrial fibrillation: Secondary | ICD-10-CM

## 2020-03-30 DIAGNOSIS — Z7901 Long term (current) use of anticoagulants: Secondary | ICD-10-CM

## 2020-03-30 DIAGNOSIS — I2510 Atherosclerotic heart disease of native coronary artery without angina pectoris: Secondary | ICD-10-CM

## 2020-03-30 DIAGNOSIS — I63411 Cerebral infarction due to embolism of right middle cerebral artery: Secondary | ICD-10-CM

## 2020-03-30 LAB — BASIC METABOLIC PANEL
BUN/Creatinine Ratio: 15 (ref 10–24)
BUN: 18 mg/dL (ref 8–27)
CO2: 24 mmol/L (ref 20–29)
Calcium: 9.3 mg/dL (ref 8.6–10.2)
Chloride: 104 mmol/L (ref 96–106)
Creatinine, Ser: 1.18 mg/dL (ref 0.76–1.27)
Glucose: 105 mg/dL — ABNORMAL HIGH (ref 65–99)
Potassium: 4.4 mmol/L (ref 3.5–5.2)
Sodium: 143 mmol/L (ref 134–144)
eGFR: 62 mL/min/{1.73_m2} (ref >59)

## 2020-03-30 LAB — CBC
Hematocrit: 38.6 % (ref 37.5–51.0)
Hemoglobin: 13.2 g/dL (ref 13.0–17.7)
MCH: 32.4 pg (ref 26.6–33.0)
MCHC: 34.2 g/dL (ref 31.5–35.7)
MCV: 95 fL (ref 79–97)
Platelets: 141 10*3/uL — ABNORMAL LOW (ref 150–450)
RBC: 4.08 x10E6/uL — ABNORMAL LOW (ref 4.14–5.80)
RDW: 13.2 % (ref 11.6–15.4)
WBC: 6.2 10*3/uL (ref 3.4–10.8)

## 2020-03-30 LAB — POCT INR: INR: 2.5 (ref 2.0–3.0)

## 2020-03-30 NOTE — Patient Instructions (Signed)
Description   Continue on same dosage Warfarin 1 tablet daily except 1.5 tablets on Mondays. Resume your green leafy vegetable intake. Recheck INR in 5 weeks.  Coumadin Clinic [phone] call with bleeding problems or new medications.

## 2020-03-30 NOTE — Progress Notes (Signed)
Patient ID: [name]., male   DOB: 05/24/1935, 85 y.o.   MRN: [id number] Patient enrolled for [employer] to ship a 3 day ZIO XT long term holter monitor to her home.  [employer] informed patient to start monitor 04/06/21.

## 2020-03-30 NOTE — Patient Instructions (Addendum)
Medication Instructions:  Stop taking Atenolol  *If you need a refill on your cardiac medications before your next appointment, please call your pharmacy*   Lab Work: Today - BMET, CBC  If you have labs (blood work) drawn today and your tests are completely normal, you will receive your results only by: Marland Kitchen MyChart Message (if you have MyChart) OR . A paper copy in the mail If you have any lab test that is abnormal or we need to change your treatment, we will call you to review the results.   Testing/Procedures: Heart monitor for 3 days (Zio) - this will be mailed to your house. Start 1 week after you discontinued your atenolol.   ZIO XT- Long Term Monitor Instructions   Your physician has requested you wear your ZIO patch monitor___3____days.   This is a single patch monitor.  [employer] supplies one patch monitor per enrollment.  Additional stickers are not available.   Please do not apply patch if you will be having a Nuclear Stress Test, Echocardiogram, Cardiac CT, MRI, or Chest Xray during the time frame you would be wearing the monitor. The patch cannot be worn during these tests.  You cannot remove and re-apply the ZIO XT patch monitor.   Your ZIO patch monitor will be sent USPS Priority mail from [facility] directly to your home address. The monitor may also be mailed to a PO BOX if home delivery is not available.   It may take 3-5 days to receive your monitor after you have been enrolled.   Once you have received you monitor, please review enclosed instructions.  Your monitor has already been registered assigning a specific monitor serial # to you.   Applying the monitor   Shave hair from upper left chest.   Hold abrader disc by orange tab.  Rub abrader in 40 strokes over left upper chest as indicated in your monitor instructions.   Clean area with 4 enclosed alcohol pads .  Use all pads to assure are is cleaned thoroughly.  Let dry.   Apply patch as indicated in  monitor instructions.  Patch will be place under collarbone on left side of chest with arrow pointing upward.   Rub patch adhesive wings for 2 minutes.Remove white label marked "1".  Remove white label marked "2".  Rub patch adhesive wings for 2 additional minutes.   While looking in a mirror, press and release button in center of patch.  A small green light will flash 3-4 times .  This will be your only indicator the monitor has been turned on.     Do not shower for the first 24 hours.  You may shower after the first 24 hours.   Press button if you feel a symptom. You will hear a small click.  Record Date, Time and Symptom in the Patient Log Book.   When you are ready to remove patch, follow instructions on last 2 pages of Patient Log Book.  Stick patch monitor onto last page of Patient Log Book.   Place Patient Log Book in [place] box.  Use locking tab on box and tape box closed securely.  The [employer] has [employer] on it.  Please place in mailbox as soon as possible.  Your physician should have your test results approximately 7 days after the monitor has been mailed back to [facility].   Call [place] at [phone] if you have questions regarding your ZIO XT patch monitor.  Call them immediately if you see an orange light blinking on your monitor.   If your monitor falls off in less than 4 days contact our Monitor department at [phone].  If your monitor becomes loose or falls off after 4 days call [employer] at [phone] for suggestions on securing your monitor.    Follow-Up: At [facility], you and your health needs are our priority.  As part of our continuing mission to provide you with exceptional heart care, we have created designated Provider Care Teams.  These Care Teams include your primary Cardiologist (physician) and Advanced Practice Providers (APPs -  Physician Assistants and Nurse Practitioners) who all work together to provide  you with the care you need, when you need it.  We recommend signing up for the patient portal called "MyChart".  Sign up information is provided on this After Visit Summary.  MyChart is used to connect with patients for Virtual Visits (Telemedicine).  Patients are able to view lab/test results, encounter notes, upcoming appointments, etc.  Non-urgent messages can be sent to your provider as well.   To learn more about what you can do with MyChart, go to [email or web address].    Your next appointment:   3 month(s) on Wednesday, June 15 @ 10: 15 am.   The format for your next appointment:   In Person  Provider:   [name], PA-C   Other Instructions Call or send mychart message if your blood pressure is higher after stopping the Atenolol. Keep an eye on BP - Call or send mychart message  if > 140 > X 3 in a row.

## 2020-04-04 DIAGNOSIS — I4821 Permanent atrial fibrillation: Secondary | ICD-10-CM | POA: Diagnosis not present

## 2020-04-18 ENCOUNTER — Telehealth: Payer: Self-pay | Admitting: Physician Assistant

## 2020-04-18 NOTE — Telephone Encounter (Signed)
Please call patient. I have reviewed guidelines and his hx again. He is on chronic warfarin Rx. He can DC ASA.  He does not need to be on both. PLAN:  DC ASA. [name], PA-C    04/18/2020 4:30 PM

## 2020-04-19 NOTE — Telephone Encounter (Signed)
S/w pt is aware of recommendation.  Medication list updated. Pt stated called office X 2 and lvm for someone to call back.  No note in chart.  Pt stated has figured this out and did not need a call back but the point is no one from the office called pt back.  Pt also stated Dr.Skains is not pt's primary cardiologist and it is clearly listed on pt's chart. Pt stated Dr. Burt Knack is pt's cardiologist. Pt is waiting for results from monitor and it clearly lists Dr. Marlou Porch.  Will t/w clinical manager to figure out and call pt back.

## 2020-04-19 NOTE — Telephone Encounter (Signed)
S/w pt to discuss that information was given to clinic supervisor, Janan Halter and will follow up with [name]. Also stated pt's primary cardiologist is Dr. Burt Knack. Pt stated appreciation.

## 2020-05-04 ENCOUNTER — Other Ambulatory Visit: Payer: Self-pay

## 2020-05-04 ENCOUNTER — Ambulatory Visit (INDEPENDENT_AMBULATORY_CARE_PROVIDER_SITE_OTHER): Payer: Medicare Other | Admitting: *Deleted

## 2020-05-04 DIAGNOSIS — Z7901 Long term (current) use of anticoagulants: Secondary | ICD-10-CM

## 2020-05-04 DIAGNOSIS — I63411 Cerebral infarction due to embolism of right middle cerebral artery: Secondary | ICD-10-CM

## 2020-05-04 DIAGNOSIS — I4811 Longstanding persistent atrial fibrillation: Secondary | ICD-10-CM

## 2020-05-04 DIAGNOSIS — Z9889 Other specified postprocedural states: Secondary | ICD-10-CM

## 2020-05-04 LAB — POCT INR: INR: 2.4 (ref 2.0–3.0)

## 2020-05-04 NOTE — Patient Instructions (Addendum)
Description   Continue taking Warfarin 1 tablet daily except 1.5 tablets on Mondays. Continue your green leafy vegetable intake. Recheck INR in 7 weeks.  Coumadin Clinic [phone] call with bleeding problems or new medications.

## 2020-05-05 ENCOUNTER — Encounter: Payer: Self-pay | Admitting: *Deleted

## 2020-05-05 NOTE — Telephone Encounter (Signed)
This encounter was created in error - please disregard.

## 2020-05-05 NOTE — Telephone Encounter (Signed)
-----   Message from Liliane Shi, [place] sent at 05/05/2020  3:33 PM EDT ----- He was having episodes of dizziness when I saw him and he was bradycardic. His monitor showed a normal avg HR (off of his atenolol).  Ask if his dizziness improved off of Atenolol (did he feel better when he was off of it?).   If he did we should try switching to a BP med that does not lower HR. [name], PA-C    05/05/2020 3:32 PM

## 2020-05-11 ENCOUNTER — Telehealth: Payer: Self-pay | Admitting: Physician Assistant

## 2020-05-11 DIAGNOSIS — I10 Essential (primary) hypertension: Secondary | ICD-10-CM

## 2020-05-11 DIAGNOSIS — I2510 Atherosclerotic heart disease of native coronary artery without angina pectoris: Secondary | ICD-10-CM

## 2020-05-11 NOTE — Telephone Encounter (Signed)
S/w pt is asymptomatic.  bp yesterday 153/83 cannot get bp today  but will try later to take several times.  Pt will take bp on Thursday several times and  will call pt back on Friday am to get readings. Will send to [place] to [place].

## 2020-05-11 NOTE — Telephone Encounter (Signed)
Pt c/o BP issue: STAT if pt c/o blurred vision, one-sided weakness or slurred speech  1. What are your last 5 BP readings? yesterday 153/83 and today 182/somthing patient could not remember  2. Are you having any other symptoms (ex. Dizziness, headache, blurred vision, passed out)? No  3. What is your BP issue? Patient think it might be his medication.

## 2020-05-12 MED ORDER — LISINOPRIL 5 MG PO TABS: 10.0000 mg | ORAL_TABLET | Freq: Every day | ORAL | 3 refills | Status: DC

## 2020-05-12 NOTE — Telephone Encounter (Signed)
Increase Lisinopril to 10 mg once daily  BMET 2 weeks Check BP once daily Make sure resting for >/= 15 min before checking; feet on floor; arm at heart level; BP cuff is an arm cuff (not wrist cuff). Call with readings in 1 week after change in med. [name], PA-C    05/12/2020 9:36 AM

## 2020-05-12 NOTE — Telephone Encounter (Signed)
Called patient to review advice from [place], [place]. Patient verbalized understanding and agreement to increase lisinopril to 10 mg daily. He will take an additional 5 mg tablet today and will monitor BP daily following the specific guidelines given. I advised we can send 10 mg tablets in the future if he stays on this regimen. He states he has been taking his BP while standing. Additional readings include: on 5/4 157/74 and 145/87 and on 5/5 172/89. He is scheduled for lab on 5/19 and will call back in 1 week or sooner to if he has questions or concerns. He thanked me for the call.

## 2020-05-16 ENCOUNTER — Telehealth: Payer: Self-pay | Admitting: Physician Assistant

## 2020-05-16 DIAGNOSIS — I10 Essential (primary) hypertension: Secondary | ICD-10-CM

## 2020-05-16 MED ORDER — AMLODIPINE BESYLATE 5 MG PO TABS: 5.0000 mg | ORAL_TABLET | Freq: Every day | ORAL | 3 refills | Status: DC

## 2020-05-16 NOTE — Telephone Encounter (Signed)
Called and spoke to patient. He states that his BP continues to remain elevated despite increasing the lisinopril to 10 mg QD. He states that his BP was 194/108 this morning after he took his meds. He states that BP last night was 193/94. Patient denies having any Sx or starting any new meds. He denies increased salt intake. Reviewed proper way to check BP. Will call this patient back in 15 minutes for recheck BP.  Called patient back. Repeat BP 187/101. Spoke with [name], PA. Patient will add amlodipine 5 mg QD to his lisinopril 10 mg QD and be seen in the HTN clinic. Amlodipine sent to preferred pharmacy. Patient scheduled to be seen in the HTN Clinic on 5/19, the same day he is getting a BMET. Instructed the patient to continue to monitor his BP and let us know if he develops any Sx or if his BP does not improve. Patient verbalized understanding and thanked me for the call.

## 2020-05-16 NOTE — Telephone Encounter (Signed)
Pt c/o BP issue: STAT if pt c/o blurred vision, one-sided weakness or slurred speech  1. What are your last 5 BP readings? This morning 194/108, last night 193/94  2. Are you having any other symptoms (ex. Dizziness, headache, blurred vision, passed out)? No   3. What is your BP issue? Maybe medication changes please advise

## 2020-05-26 ENCOUNTER — Ambulatory Visit (INDEPENDENT_AMBULATORY_CARE_PROVIDER_SITE_OTHER): Payer: Medicare Other | Admitting: Pharmacist

## 2020-05-26 ENCOUNTER — Other Ambulatory Visit: Payer: Self-pay

## 2020-05-26 ENCOUNTER — Other Ambulatory Visit: Payer: Medicare Other

## 2020-05-26 VITALS — BP 132/88 | HR 72 | Wt 178.6 lb

## 2020-05-26 DIAGNOSIS — R2689 Other abnormalities of gait and mobility: Secondary | ICD-10-CM | POA: Insufficient documentation

## 2020-05-26 DIAGNOSIS — G479 Sleep disorder, unspecified: Secondary | ICD-10-CM | POA: Insufficient documentation

## 2020-05-26 DIAGNOSIS — I509 Heart failure, unspecified: Secondary | ICD-10-CM | POA: Insufficient documentation

## 2020-05-26 DIAGNOSIS — Z85828 Personal history of other malignant neoplasm of skin: Secondary | ICD-10-CM | POA: Insufficient documentation

## 2020-05-26 DIAGNOSIS — E559 Vitamin D deficiency, unspecified: Secondary | ICD-10-CM | POA: Insufficient documentation

## 2020-05-26 DIAGNOSIS — R7303 Prediabetes: Secondary | ICD-10-CM | POA: Insufficient documentation

## 2020-05-26 DIAGNOSIS — I2510 Atherosclerotic heart disease of native coronary artery without angina pectoris: Secondary | ICD-10-CM

## 2020-05-26 DIAGNOSIS — R972 Elevated prostate specific antigen [PSA]: Secondary | ICD-10-CM | POA: Insufficient documentation

## 2020-05-26 DIAGNOSIS — D696 Thrombocytopenia, unspecified: Secondary | ICD-10-CM | POA: Insufficient documentation

## 2020-05-26 DIAGNOSIS — D6859 Other primary thrombophilia: Secondary | ICD-10-CM | POA: Insufficient documentation

## 2020-05-26 DIAGNOSIS — I10 Essential (primary) hypertension: Secondary | ICD-10-CM

## 2020-05-26 DIAGNOSIS — G629 Polyneuropathy, unspecified: Secondary | ICD-10-CM | POA: Insufficient documentation

## 2020-05-26 DIAGNOSIS — Z86010 Personal history of colonic polyps: Secondary | ICD-10-CM | POA: Insufficient documentation

## 2020-05-26 DIAGNOSIS — Z952 Presence of prosthetic heart valve: Secondary | ICD-10-CM | POA: Insufficient documentation

## 2020-05-26 DIAGNOSIS — K2990 Gastroduodenitis, unspecified, without bleeding: Secondary | ICD-10-CM | POA: Insufficient documentation

## 2020-05-26 DIAGNOSIS — Z862 Personal history of diseases of the blood and blood-forming organs and certain disorders involving the immune mechanism: Secondary | ICD-10-CM | POA: Insufficient documentation

## 2020-05-26 MED ORDER — AMLODIPINE BESYLATE 10 MG PO TABS: 10.0000 mg | ORAL_TABLET | Freq: Every day | ORAL | 3 refills | Status: DC

## 2020-05-26 NOTE — Patient Instructions (Addendum)
It was nice meeting you today!  We would like your blood pressure to be less than 130/80  Continue your lisinopril 10 mg daily Continue your atenolol 12.5 mg once a day  We will increase your amlodipine to 10 mg once a day  Continue your healthy eating.  Try to watch how much salt you eat, especially when out to dinner  Continue to check your blood pressure at home and call if there are any readings that concern you  We will call you tomorrow with your lab results  Please call with any questions  Karren Cobble, PharmD, BCACP, [place], [place] [address] [place],  [address] Phone: [phone]; Fax: [phone] 05/26/2020 4:31 PM

## 2020-05-26 NOTE — Progress Notes (Signed)
Patient ID: [name].                 DOB: 01-22-1935                      MRN: [id number]     HPI: [name]. is a 85 y.o. male referred by [name] to HTN clinic. PMH is significant for HTN, A Fib on warfarin, CVA, T2DM, stroke, and mitral valve regurgitation.  He is s/p AVR and mitral valve repair.  Patient was seen by [name] on 3/23 and BP was controlled.  However in early May patient called and BP had increased to 153/83, 157/74 and 172/89.  He had discontinued his atenolol.  PCP recommended he restart.  Patient was referred to HTN clinic.  Patient presents today in good spirits.  Has had no adverse effects to lisinopril or amlodipine.  Is physcially active.  Walks 4-5 days a week for 0.55miles, uses a punching bag, and lifts 30# weights.    Eats the same breakfast daily: Egg sandwich with ham or bacon.  Lunch is typically spinach or greens due to his warfarin.  Dinner he usually eats out at [employer], [place], or [employer].  Does not monitor salt content when eating out.    Home BP readings from past week 139/81 141/71 116/73 138/78  Patient takes blood pressure around noon and takes standing up because his cuff is in the closet and it is easier for him to read.  Reports clinic readings are typically higher than home readings.  Is due for BMP today.  Current HTN meds: lisinopril 10mg , amlodipine 5 mg, atenolol 12.5mg   BP goal: <130/80  Family History: Not aware of any significant family history  Social History: not much alcohol, no tobacco   Wt Readings from Last 3 Encounters:  03/30/20 180 lb (81.6 kg)  11/25/19 177 lb 9.6 oz (80.6 kg)  07/20/19 170 lb 9.6 oz (77.4 kg)   BP Readings from Last 3 Encounters:  03/30/20 140/80  11/25/19 (!) 164/80  07/20/19 (!) 160/96   Pulse Readings from Last 3 Encounters:  03/30/20 (!) 55  11/25/19 70  07/20/19 61    Renal function: CrCl cannot be calculated (Patient's most recent  lab result is older than the maximum 21 days allowed.).  Past Medical History:  Diagnosis Date  . Acquired dilation of ascending aorta and aortic root (HCC)    27mm aortic root and 56mm ascending aorta on echo 03/2020  . Bladder stones   . Borderline diabetes   . BPH (benign prostatic hyperplasia)   . Coronary artery disease    cardiologist-  dr Nat Math gerhart NP--- per cath 06-02-2010 non-obstructive cad pLAD 30-40%  . Diverticulosis of colon   . Dysrhythmia    afib  . Gout   . Heart murmur   . History of adenomatous polyp of colon    2002-- tubular adenoma  . History of aortic insufficiency    severe -- s/p  AVR 08-03-2010  . History of small bowel obstruction    02/ 2007 mechanical sbo s/p  surgical intervention;  partial sbo 09/ 2011 and 03-20-2011 resolved without surgical intervention  . History of urinary retention   . HTN (hypertension)   . Peripheral neuropathy   . Persistent atrial fibrillation ([place])   . S/P aortic valve replacement with prosthetic valve 08/03/2010   tissue valve  . S/P Maze operation for  atrial fibrillation 01/30/2018   Complete bilateral atrial lesion set using bipolar radiofrequency and cryothermy with clipping of LA appendage  . S/P MVR (mitral valve repair) 01/30/2018   Complex valvuloplasty including artificial Gore-tex neochord placement x4 and Carbo medics Annuloflex ring annuloplasty, size 28  . S/P patent foramen ovale closure 08/03/2010   at same time AVR  . S/P tricuspid valve repair 01/30/2018   Using an MC3 Annuloplasty ring, size 28  . Stroke ([place])   . Tricuspid regurgitation     Current Outpatient Medications on File Prior to Visit  Medication Sig Dispense Refill  . allopurinol (ZYLOPRIM) 300 MG tablet Take 150 mg by mouth daily.    Marland Kitchen amLODipine (NORVASC) 5 MG tablet Take 1 tablet (5 mg total) by mouth daily. 90 tablet 3  . amoxicillin (AMOXIL) 500 MG capsule Take 2,000 mg by mouth See admin instructions. Take 2000 mg by mouth  1 hour prior to dental appointment    . b complex vitamins tablet Take 1 tablet by mouth daily.    . cholecalciferol (VITAMIN D3) 25 MCG (1000 UNIT) tablet Take 1,000 Units by mouth daily.    . Cinnamon 500 MG capsule Take 500 mg by mouth daily.    . ferrous sulfate 325 (65 FE) MG EC tablet Take 325 mg by mouth daily with breakfast.    . finasteride (PROSCAR) 5 MG tablet Take 1 tablet (5 mg total) by mouth daily. 30 tablet 0  . gabapentin (NEURONTIN) 100 MG capsule Take 100 mg by mouth daily.    Marland Kitchen latanoprost (XALATAN) 0.005 % ophthalmic solution Place 1 drop into both eyes at bedtime.    Marland Kitchen lisinopril (ZESTRIL) 5 MG tablet Take 2 tablets (10 mg total) by mouth daily. 90 tablet 3  . tamsulosin (FLOMAX) 0.4 MG CAPS capsule Take 2 capsules (0.8 mg total) by mouth daily after breakfast. 30 capsule 0  . warfarin (COUMADIN) 5 MG tablet TAKE 1 TO 1 AND 1/2 TABLETS BY MOUTH DAILY OR AS  DIRECTED BY ANTICOAGULATION CLINIC 135 tablet 3   No current facility-administered medications on file prior to visit.    Allergies  Allergen Reactions  . Sulfa Antibiotics Other (See Comments)    Granulocytosis     Assessment/Plan:  1. Hypertension - Patient BP today 132/88 which is above goal of <130/80.  Patient surprised since he typically has white coat HTN.    Recommended to patient to watch salt intake, especially since he frequently eats at restaurants.  Recommended he continue his exercise routine.    Recommended patient begin taking BP sitting down.  Needs further BP lowering.  Will increase amlodipine to 10mg  at this time. Lab work pending.  Since atenolol not causing any hypotension or low heart rate, will continue at this time. Patient voiced understanding.  Continue lisinopril 10mg  daily Continue atenolol 12.5mg  daily Increase amlodipine to 10mg  daily Has recheck scheduled with [name] in 4 weeks  Karren Cobble, PharmD, BCACP, [place], [place] [address] [place], [place] [address] Phone: [phone]; Fax: [phone] 05/26/2020 5:14 PM

## 2020-05-27 LAB — BASIC METABOLIC PANEL
BUN/Creatinine Ratio: 16 (ref 10–24)
BUN: 15 mg/dL (ref 8–27)
CO2: 22 mmol/L (ref 20–29)
Calcium: 9.5 mg/dL (ref 8.6–10.2)
Chloride: 107 mmol/L — ABNORMAL HIGH (ref 96–106)
Creatinine, Ser: 0.95 mg/dL (ref 0.76–1.27)
Glucose: 86 mg/dL (ref 65–99)
Potassium: 4.3 mmol/L (ref 3.5–5.2)
Sodium: 144 mmol/L (ref 134–144)
eGFR: 80 mL/min/{1.73_m2} (ref >59)

## 2020-06-15 ENCOUNTER — Other Ambulatory Visit: Payer: Self-pay | Admitting: *Deleted

## 2020-06-15 MED ORDER — LISINOPRIL 5 MG PO TABS: 10.0000 mg | ORAL_TABLET | Freq: Every day | ORAL | 3 refills | Status: DC

## 2020-06-21 NOTE — Progress Notes (Signed)
Cardiology Office Note:    Date:  06/22/2020   ID:  [name]., DOB 16-Mar-1935, MRN [id number]  PCP:  Marda Stalker, PA-C   [facility] Providers Cardiologist:  Candee Furbish, MD Cardiology APP:  Sharmon [facility]     Referring MD: Marda Stalker, PA-C   Chief Complaint:  Follow-up (Hypertension)    Patient Profile:    [name]. is a 85 y.o. male with:  Aortic insufficiency S/p bioprosthetic AVR and Patent Foramen Ovale closure in 2012 Severe MR, severe TR S/p MV Repair, TV Repair, MAZE, LA clipping in 01/2018 Hypertension Permanent atrial fibrillation/flutter Chronic anticoagulation w Warfarin Hx of R MCA CVA tx with cath based Rx Coronary artery disease Non-obstructive by cath in 10/19 Hx of GI bleed Borderline diabetes mellitus BPH Peripheral neuropathy   Prior CV studies: LONG TERM MONITOR (3-7 DAYS) INTERPRETATION 04/14/2020 Narrative  Unable to determine rhythm due to baseline artifact however there are periods of sinus rhtyhm noted (see end of monitor result tracings). Average heart rate of 66 bpm  151 SVT runs with fastest averaging 150 bpm lasting 13 beats - Symptoms noted surrounding SVT episode 92 bpm (dizzy, short of breath), no pauses.  Mininmum heart rate of 31 briefly noted during sleep, asymptomatic.  Second degree AV block type 1 [name]) noted (during sleep, brief episode)  PAC's were frequent at 6.4%, occasional couplets  PVC's were rare  Possible atrial fibrillation/ flutter, no pauses (baseline artifact at times during tracing noted) Last two ECGs in clinic were atrial flutter with slow response 57 bpm and atrial fibrillation with slow response 55 bpm. Symptoms on monitor were surrounding heart rates of 90 bpm, no pauses.  Echocardiogram 03/16/20 EF 60-65, no RWMA, mod LVH, GLS -20.3%, normal RVSF, RVSP 35.3, severe BAE, normally functioning MV repair (mean 6 mmHg, mild MR), TV repair ok with mean 2 mmHg,  AVR ok with mean 16 mmHg, DI 0.5, aortic root 43 mm, ascending aorta 41 mm, s/p PFO closure - no residual shunt   Carotid US 01/20/2018 R ICA without stenosis; L 1-39   RIGHT/LEFT HEART CATH AND CORONARY ANGIOGRAPHY 10/14/2017 Narrative  Prox Cx to Mid Cx lesion is 30% stenosed.  Ost LAD to Prox LAD lesion is 30% stenosed.  Prox LAD to Mid LAD lesion is 40% stenosed.   History of Present Illness: [name] was last seen in March 2022.  I stopped his atenolol due to slow VR and symptoms of lightheadedness.  A f/u Zio monitor showed an avg HR of 66.  The pt's BP increased and he was started back on the atenolol by primary care.  He continued to have elevated BPs and we increased his Lisinopril and then added Amlodipine.  He was seen in the PharmD clinic 05/26/20 with improved BP.  His amlodipine was increased to 10 mg once daily as his BP was not quite to goal.  He returns for f/u.  He is here alone.  Overall, he has been doing well.  He has not had chest discomfort or significant shortness of breath.  He has not had syncope or near syncope.  He has occasional lightheadedness.  He has not had orthopnea or leg edema.    Past Medical History:  Diagnosis Date   Acquired dilation of ascending aorta and aortic root (HCC)    70mm aortic root and 34mm ascending aorta on echo 03/2020   Bladder stones    Borderline diabetes    BPH (benign prostatic  hyperplasia)    Coronary artery disease    cardiologist-  dr Nat Math gerhart NP--- per cath 06-02-2010 non-obstructive cad pLAD 30-40%   Diverticulosis of colon    Dysrhythmia    afib   Gout    Heart murmur    History of adenomatous polyp of colon    2002-- tubular adenoma   History of aortic insufficiency    severe -- s/p  AVR 08-03-2010   History of small bowel obstruction    02/ 2007 mechanical sbo s/p  surgical intervention;  partial sbo 09/ 2011 and 03-20-2011 resolved without surgical intervention   History of urinary retention     HTN (hypertension)    Peripheral neuropathy    Persistent atrial fibrillation (HCC)    S/P aortic valve replacement with prosthetic valve 08/03/2010   tissue valve   S/P Maze operation for atrial fibrillation 01/30/2018   Complete bilateral atrial lesion set using bipolar radiofrequency and cryothermy with clipping of LA appendage   S/P MVR (mitral valve repair) 01/30/2018   Complex valvuloplasty including artificial Gore-tex neochord placement x4 and Carbo medics Annuloflex ring annuloplasty, size 28   S/P patent foramen ovale closure 08/03/2010   at same time AVR   S/P tricuspid valve repair 01/30/2018   Using an MC3 Annuloplasty ring, size 28   Stroke ([place])    Tricuspid regurgitation     Current Medications: Current Meds  Medication Sig   amLODipine (NORVASC) 10 MG tablet Take 1 tablet (10 mg total) by mouth daily.   lisinopril (ZESTRIL) 10 MG tablet Take 1 tablet (10 mg total) by mouth daily.     Allergies:   Sulfa antibiotics   Social History   Tobacco Use   Smoking status: Never   Smokeless tobacco: Never  Vaping Use   Vaping Use: Never used  Substance Use Topics   Alcohol use: Yes    Comment: ONE OR TWO PER MONTH   Drug use: No     Family Hx: The patient's family history includes Brain cancer in his father; Heart disease in his mother.  Review of Systems  Gastrointestinal:  Negative for hematochezia.    EKGs/Labs/Other Test Reviewed:    EKG:  EKG is not ordered today.  The ekg ordered today demonstrates n/a  Recent Labs: 11/25/2019: ALT 12 03/30/2020: Hemoglobin 13.2; Platelets 141 05/26/2020: BUN 15; Creatinine, Ser 0.95; Potassium 4.3; Sodium 144   Recent Lipid Panel Lab Results  Component Value Date/Time   CHOL 146 11/25/2019 10:40 AM   TRIG 128 11/25/2019 10:40 AM   HDL 38 (L) 11/25/2019 10:40 AM   LDLCALC 85 11/25/2019 10:40 AM      Risk Assessment/Calculations:    CHA2DS2-VASc Score = 6  This indicates a 9.7% annual risk of stroke. The  patient's score is based upon: CHF History: No HTN History: Yes Diabetes History: No Stroke History: Yes Vascular Disease History: Yes Age Score: 2 Gender Score: 0     Physical Exam:    VS:  BP 128/78   Pulse (!) 59   Ht 6' (1.829 m)   Wt 180 lb (81.6 kg)   SpO2 97%   BMI 24.41 kg/m     Wt Readings from Last 3 Encounters:  06/22/20 180 lb (81.6 kg)  05/26/20 178 lb 9.6 oz (81 kg)  03/30/20 180 lb (81.6 kg)     Constitutional:      Appearance: Healthy appearance. Not in distress.  Pulmonary:     Effort: Pulmonary effort is  normal.     Breath sounds: No wheezing. No rales.  Cardiovascular:     Normal rate. Regular rhythm. Normal S1. Normal S2.      Murmurs: There is a grade 1/6 early systolic murmur at the URSB.  Edema:    Peripheral edema absent.  Abdominal:     Palpations: Abdomen is soft.  Musculoskeletal:     Cervical back: Neck supple. Skin:    General: Skin is warm and dry.  Neurological:     General: No focal deficit present.     Mental Status: Alert and oriented to person, place and time.     Cranial Nerves: Cranial nerves are intact.        ASSESSMENT & PLAN:    1. Essential hypertension Blood pressure is much better controlled.  Continue current dose of amlodipine, lisinopril, atenolol.  2. Permanent atrial fibrillation (HCC) Good rate control on recent monitor.  He is tolerating anticoagulation.  This is managed in our Coumadin clinic.  He is not having any symptoms of bradycardia.  Continue current dose of atenolol.  3. Dilated aortic root (HCC) 43 mm by echocardiogram in 3/22.  Repeat echocardiogram will be obtained in March 2023.  He does lift weights on a regular basis.  I have asked him to avoid lifting above 40 pounds and to avoid unnecessary straining.  4. S/P AVR Stable by most recent echocardiogram.  Continue SBE prophylaxis.       Dispo:  Return in about 6 months (around 12/22/2020) for Routine Follow Up, w/ [name], PA-C.    Medication Adjustments/Labs and Tests Ordered: Current medicines are reviewed at length with the patient today.  Concerns regarding medicines are outlined above.  Tests Ordered: Orders Placed This Encounter  Procedures   ECHOCARDIOGRAM COMPLETE   Medication Changes: Meds ordered this encounter  Medications   amLODipine (NORVASC) 10 MG tablet    Sig: Take 1 tablet (10 mg total) by mouth daily.    Dispense:  90 tablet    Refill:  3    Order Specific Question:   Supervising Provider    Answer:   Lelon Perla [[id number]]   lisinopril (ZESTRIL) 10 MG tablet    Sig: Take 1 tablet (10 mg total) by mouth daily.    Dispense:  90 tablet    Refill:  3    Order Specific Question:   Supervising Provider    Answer:   Lelon Perla [[id number]]    Signed, [name], PA-C  06/22/2020 11:36 AM    [facility] [address], [place], [place]  [address] Phone: [phone]; Fax: [phone]

## 2020-06-22 ENCOUNTER — Ambulatory Visit (INDEPENDENT_AMBULATORY_CARE_PROVIDER_SITE_OTHER): Payer: Medicare Other | Admitting: Pharmacist

## 2020-06-22 ENCOUNTER — Other Ambulatory Visit: Payer: Self-pay

## 2020-06-22 ENCOUNTER — Encounter: Payer: Self-pay | Admitting: Physician Assistant

## 2020-06-22 ENCOUNTER — Ambulatory Visit: Payer: Medicare Other | Admitting: Physician Assistant

## 2020-06-22 VITALS — BP 128/78 | HR 59 | Ht 72.0 in | Wt 180.0 lb

## 2020-06-22 DIAGNOSIS — I77810 Thoracic aortic ectasia: Secondary | ICD-10-CM

## 2020-06-22 DIAGNOSIS — Z9889 Other specified postprocedural states: Secondary | ICD-10-CM | POA: Diagnosis not present

## 2020-06-22 DIAGNOSIS — Z7901 Long term (current) use of anticoagulants: Secondary | ICD-10-CM | POA: Diagnosis not present

## 2020-06-22 DIAGNOSIS — I4821 Permanent atrial fibrillation: Secondary | ICD-10-CM | POA: Diagnosis not present

## 2020-06-22 DIAGNOSIS — I10 Essential (primary) hypertension: Secondary | ICD-10-CM | POA: Diagnosis not present

## 2020-06-22 DIAGNOSIS — Z952 Presence of prosthetic heart valve: Secondary | ICD-10-CM | POA: Diagnosis not present

## 2020-06-22 DIAGNOSIS — I63411 Cerebral infarction due to embolism of right middle cerebral artery: Secondary | ICD-10-CM | POA: Diagnosis not present

## 2020-06-22 DIAGNOSIS — I4811 Longstanding persistent atrial fibrillation: Secondary | ICD-10-CM | POA: Diagnosis not present

## 2020-06-22 LAB — POCT INR: INR: 2.6 (ref 2.0–3.0)

## 2020-06-22 MED ORDER — AMLODIPINE BESYLATE 10 MG PO TABS: 10.0000 mg | ORAL_TABLET | Freq: Every day | ORAL | 3 refills | Status: DC

## 2020-06-22 MED ORDER — LISINOPRIL 10 MG PO TABS: 10.0000 mg | ORAL_TABLET | Freq: Every day | ORAL | 3 refills | Status: DC

## 2020-06-22 NOTE — Patient Instructions (Signed)
Description   Continue taking Warfarin 1 tablet daily except 1.5 tablets on Mondays. Continue your green leafy vegetable intake. Recheck INR in 8 weeks.  Coumadin Clinic [phone] call with bleeding problems or new medications.

## 2020-06-22 NOTE — Patient Instructions (Addendum)
Medication Instructions:  Your physician recommends that you continue on your current medications as directed. Please refer to the Current Medication list given to you today.  *If you need a refill on your cardiac medications before your next appointment, please call your pharmacy*   Lab Work: -None  If you have labs (blood work) drawn today and your tests are completely normal, you will receive your results only by: [place] (if you have MyChart) OR A paper copy in the mail If you have any lab test that is abnormal or we need to change your treatment, we will call you to review the results.   Testing/Procedure Your physician has requested that you have an echocardiogram. Wednesday, MARCH 15 @ 9:30 Echocardiography is a painless test that uses sound waves to create images of your heart. It provides your doctor with information about the size and shape of your heart and how well your heart's chambers and valves are working. This procedure takes approximately one hour. There are no restrictions for this procedure.    Follow-Up: At [facility], you and your health needs are our priority.  As part of our continuing mission to provide you with exceptional heart care, we have created designated Provider Care Teams.  These Care Teams include your primary Cardiologist (physician) and Advanced Practice Providers (APPs -  Physician Assistants and Nurse Practitioners) who all work together to provide you with the care you need, when you need it.  We recommend signing up for the patient portal called "MyChart".  Sign up information is provided on this After Visit Summary.  MyChart is used to connect with patients for Virtual Visits (Telemedicine).  Patients are able to view lab/test results, encounter notes, upcoming appointments, etc.  Non-urgent messages can be sent to your provider as well.   To learn more about what you can do with MyChart, go to [email or web address].    Your next  appointment:   6 month(s)  The format for your next appointment:   In Person  Provider:   [name], PA-C   Other Instructions Your physician wants you to follow-up in: 6 month with [name], PA.  You will receive a reminder letter in the mail two months in advance. If you don't receive a letter, please call our office to schedule the follow-up appointment.

## 2020-07-12 ENCOUNTER — Telehealth: Payer: Self-pay | Admitting: Pharmacist

## 2020-07-12 NOTE — Telephone Encounter (Signed)
Patient called asking if he could take voltaren gel since he was on warfarin. He has been taking once a day. I advised that there is some, not much, but some absorption into the blood. Therefore the risk of GI bleeding exists, but it is low, especially if he is only using once a day. Patient appreciative of the help.

## 2020-08-01 ENCOUNTER — Emergency Department (HOSPITAL_COMMUNITY): Payer: Medicare Other

## 2020-08-01 ENCOUNTER — Inpatient Hospital Stay (HOSPITAL_COMMUNITY)
Admission: EM | Admit: 2020-08-01 | Discharge: 2020-08-05 | DRG: 522 | Disposition: A | Discharge status: Home Health | Payer: Medicare Other | Attending: Internal Medicine | Admitting: Internal Medicine

## 2020-08-01 ENCOUNTER — Other Ambulatory Visit: Payer: Self-pay

## 2020-08-01 ENCOUNTER — Encounter (HOSPITAL_COMMUNITY): Payer: Self-pay

## 2020-08-01 DIAGNOSIS — Z961 Presence of intraocular lens: Secondary | ICD-10-CM | POA: Diagnosis present

## 2020-08-01 DIAGNOSIS — E440 Moderate protein-calorie malnutrition: Secondary | ICD-10-CM | POA: Diagnosis present

## 2020-08-01 DIAGNOSIS — Z882 Allergy status to sulfonamides status: Secondary | ICD-10-CM

## 2020-08-01 DIAGNOSIS — D509 Iron deficiency anemia, unspecified: Secondary | ICD-10-CM | POA: Diagnosis present

## 2020-08-01 DIAGNOSIS — D62 Acute posthemorrhagic anemia: Secondary | ICD-10-CM | POA: Diagnosis not present

## 2020-08-01 DIAGNOSIS — M109 Gout, unspecified: Secondary | ICD-10-CM | POA: Diagnosis present

## 2020-08-01 DIAGNOSIS — Z6825 Body mass index (BMI) 25.0-25.9, adult: Secondary | ICD-10-CM

## 2020-08-01 DIAGNOSIS — W010XXA Fall on same level from slipping, tripping and stumbling without subsequent striking against object, initial encounter: Secondary | ICD-10-CM | POA: Diagnosis present

## 2020-08-01 DIAGNOSIS — S72002A Fracture of unspecified part of neck of left femur, initial encounter for closed fracture: Secondary | ICD-10-CM | POA: Diagnosis present

## 2020-08-01 DIAGNOSIS — Z86010 Personal history of colonic polyps: Secondary | ICD-10-CM | POA: Diagnosis not present

## 2020-08-01 DIAGNOSIS — I2510 Atherosclerotic heart disease of native coronary artery without angina pectoris: Secondary | ICD-10-CM | POA: Diagnosis present

## 2020-08-01 DIAGNOSIS — N401 Enlarged prostate with lower urinary tract symptoms: Secondary | ICD-10-CM | POA: Diagnosis present

## 2020-08-01 DIAGNOSIS — Z953 Presence of xenogenic heart valve: Secondary | ICD-10-CM | POA: Diagnosis not present

## 2020-08-01 DIAGNOSIS — Z01811 Encounter for preprocedural respiratory examination: Secondary | ICD-10-CM

## 2020-08-01 DIAGNOSIS — R7303 Prediabetes: Secondary | ICD-10-CM | POA: Diagnosis present

## 2020-08-01 DIAGNOSIS — Z8673 Personal history of transient ischemic attack (TIA), and cerebral infarction without residual deficits: Secondary | ICD-10-CM | POA: Diagnosis not present

## 2020-08-01 DIAGNOSIS — D72828 Other elevated white blood cell count: Secondary | ICD-10-CM | POA: Diagnosis present

## 2020-08-01 DIAGNOSIS — I10 Essential (primary) hypertension: Secondary | ICD-10-CM | POA: Diagnosis present

## 2020-08-01 DIAGNOSIS — Z808 Family history of malignant neoplasm of other organs or systems: Secondary | ICD-10-CM | POA: Diagnosis not present

## 2020-08-01 DIAGNOSIS — Z96649 Presence of unspecified artificial hip joint: Secondary | ICD-10-CM

## 2020-08-01 DIAGNOSIS — G629 Polyneuropathy, unspecified: Secondary | ICD-10-CM | POA: Diagnosis present

## 2020-08-01 DIAGNOSIS — I4821 Permanent atrial fibrillation: Secondary | ICD-10-CM | POA: Diagnosis present

## 2020-08-01 DIAGNOSIS — Z79899 Other long term (current) drug therapy: Secondary | ICD-10-CM | POA: Diagnosis not present

## 2020-08-01 DIAGNOSIS — R338 Other retention of urine: Secondary | ICD-10-CM | POA: Diagnosis present

## 2020-08-01 DIAGNOSIS — Z7901 Long term (current) use of anticoagulants: Secondary | ICD-10-CM | POA: Diagnosis not present

## 2020-08-01 DIAGNOSIS — Z8249 Family history of ischemic heart disease and other diseases of the circulatory system: Secondary | ICD-10-CM

## 2020-08-01 DIAGNOSIS — I4811 Longstanding persistent atrial fibrillation: Secondary | ICD-10-CM | POA: Diagnosis not present

## 2020-08-01 DIAGNOSIS — Y92009 Unspecified place in unspecified non-institutional (private) residence as the place of occurrence of the external cause: Secondary | ICD-10-CM | POA: Diagnosis not present

## 2020-08-01 DIAGNOSIS — Z20822 Contact with and (suspected) exposure to covid-19: Secondary | ICD-10-CM | POA: Diagnosis present

## 2020-08-01 DIAGNOSIS — M1712 Unilateral primary osteoarthritis, left knee: Secondary | ICD-10-CM | POA: Diagnosis present

## 2020-08-01 DIAGNOSIS — I4891 Unspecified atrial fibrillation: Secondary | ICD-10-CM | POA: Diagnosis present

## 2020-08-01 DIAGNOSIS — Z8774 Personal history of (corrected) congenital malformations of heart and circulatory system: Secondary | ICD-10-CM

## 2020-08-01 DIAGNOSIS — Z96641 Presence of right artificial hip joint: Secondary | ICD-10-CM

## 2020-08-01 DIAGNOSIS — Z952 Presence of prosthetic heart valve: Secondary | ICD-10-CM

## 2020-08-01 LAB — CBC WITH DIFFERENTIAL/PLATELET
Abs Immature Granulocytes: 0.04 10*3/uL (ref 0.00–0.07)
Basophils Absolute: 0 10*3/uL (ref 0.0–0.1)
Basophils Relative: 0 %
Eosinophils Absolute: 0 10*3/uL (ref 0.0–0.5)
Eosinophils Relative: 0 %
HCT: 38.3 % — ABNORMAL LOW (ref 39.0–52.0)
Hemoglobin: 13.1 g/dL (ref 13.0–17.0)
Immature Granulocytes: 0 %
Lymphocytes Relative: 9 %
Lymphs Abs: 1.2 10*3/uL (ref 0.7–4.0)
MCH: 33 pg (ref 26.0–34.0)
MCHC: 34.2 g/dL (ref 30.0–36.0)
MCV: 96.5 fL (ref 80.0–100.0)
Monocytes Absolute: 0.8 10*3/uL (ref 0.1–1.0)
Monocytes Relative: 6 %
Neutro Abs: 11 10*3/uL — ABNORMAL HIGH (ref 1.7–7.7)
Neutrophils Relative %: 85 %
Platelets: 155 10*3/uL (ref 150–400)
RBC: 3.97 MIL/uL — ABNORMAL LOW (ref 4.22–5.81)
RDW: 13.3 % (ref 11.5–15.5)
WBC: 13 10*3/uL — ABNORMAL HIGH (ref 4.0–10.5)
nRBC: 0 % (ref 0.0–0.2)

## 2020-08-01 LAB — RESP PANEL BY RT-PCR (FLU A&B, COVID) ARPGX2
Influenza A by PCR: NEGATIVE
Influenza B by PCR: NEGATIVE
SARS Coronavirus 2 by RT PCR: NEGATIVE

## 2020-08-01 LAB — COMPREHENSIVE METABOLIC PANEL
ALT: 17 U/L (ref 0–44)
AST: 23 U/L (ref 15–41)
Albumin: 4.5 g/dL (ref 3.5–5.0)
Alkaline Phosphatase: 56 U/L (ref 38–126)
Anion gap: 11 (ref 5–15)
BUN: 19 mg/dL (ref 8–23)
CO2: 21 mmol/L — ABNORMAL LOW (ref 22–32)
Calcium: 9.3 mg/dL (ref 8.9–10.3)
Chloride: 106 mmol/L (ref 98–111)
Creatinine, Ser: 0.86 mg/dL (ref 0.61–1.24)
GFR, Estimated: >60 mL/min (ref >60)
Glucose, Bld: 140 mg/dL — ABNORMAL HIGH (ref 70–99)
Potassium: 3.9 mmol/L (ref 3.5–5.1)
Sodium: 138 mmol/L (ref 135–145)
Total Bilirubin: 0.8 mg/dL (ref 0.3–1.2)
Total Protein: 7.8 g/dL (ref 6.5–8.1)

## 2020-08-01 LAB — PROTIME-INR
INR: 2.2 — ABNORMAL HIGH (ref 0.8–1.2)
Prothrombin Time: 24.6 seconds — ABNORMAL HIGH (ref 11.4–15.2)

## 2020-08-01 MED ORDER — MORPHINE SULFATE (PF) 2 MG/ML IV SOLN
2.0000 mg | INTRAVENOUS | Status: DC | PRN
  Administered 2020-08-01 – 2020-08-02 (×3): 2 mg via INTRAVENOUS
  Filled 2020-08-01 (×3): qty 1

## 2020-08-01 MED ORDER — MORPHINE SULFATE (PF) 4 MG/ML IV SOLN
4.0000 mg | Freq: Once | INTRAVENOUS | Status: AC
  Administered 2020-08-01: 4 mg via INTRAVENOUS
  Filled 2020-08-01: qty 1

## 2020-08-01 NOTE — ED Triage Notes (Signed)
Pt reports felling his left leg give out and fall and landed on his left hip. Pt unable to bear weight on left side.  Did not hit his head. Pt on warfarin.   VS BP 156/68 HR 74 RR 22 SPO2 98%

## 2020-08-01 NOTE — ED Provider Notes (Signed)
[facility] DEPT Provider Note   CSN: [id number] Arrival date & time: 08/01/20  1717     History Chief Complaint  Patient presents with   Leg Injury    left    [name]. is a 85 y.o. male.  Patient tripped and fell on his left hip.  He complains of left hip pain.  He cannot walk he did not hit his head  The history is provided by the patient and medical records.  Fall This is a new problem. The problem occurs rarely. The problem has been resolved. Pertinent negatives include no chest pain, no abdominal pain and no headaches. Nothing aggravates the symptoms. Nothing relieves the symptoms. He has tried nothing for the symptoms.      Past Medical History:  Diagnosis Date   Acquired dilation of ascending aorta and aortic root (HCC)    27m aortic root and 412mascending aorta on echo 03/2020   Bladder stones    Borderline diabetes    BPH (benign prostatic hyperplasia)    Coronary artery disease    cardiologist-  dr skNat Matherhart NP--- per cath 06-02-2010 non-obstructive cad pLAD 30-40%   Diverticulosis of colon    Dysrhythmia    afib   Gout    Heart murmur    History of adenomatous polyp of colon    2002-- tubular adenoma   History of aortic insufficiency    severe -- s/p  AVR 08-03-2010   History of small bowel obstruction    02/ 2007 mechanical sbo s/p  surgical intervention;  partial sbo 09/ 2011 and 03-20-2011 resolved without surgical intervention   History of urinary retention    HTN (hypertension)    Peripheral neuropathy    Persistent atrial fibrillation (HCC)    S/P aortic valve replacement with prosthetic valve 08/03/2010   tissue valve   S/P Maze operation for atrial fibrillation 01/30/2018   Complete bilateral atrial lesion set using bipolar radiofrequency and cryothermy with clipping of LA appendage   S/P MVR (mitral valve repair) 01/30/2018   Complex valvuloplasty including artificial Gore-tex neochord  placement x4 and [employer] med[employer] Annuloflex ring annuloplasty, size 28   S/P patent foramen ovale closure 08/03/2010   at same time AVR   S/P tricuspid valve repair 01/30/2018   Using an MC3 Annuloplasty ring, size 28   [facility]   Tricuspid regurgitation     Patient Active Problem List   Diagnosis Date Noted   Closed left hip fracture, initial encounter (HCNerstrand07/25/2022   Chronic congestive heart failure (HCR[place]05/19/2022   Elevated PSA 05/26/2020   Gastroduodenitis 05/26/2020   History of adenomatous polyp of colon 05/26/2020   History of anemia 05/26/2020   Hypercoagulable state (HCAlto05/19/2022   Impairment of balance 05/26/2020   Peripheral neuropathy 05/26/2020   Personal history of other malignant neoplasm of skin 05/26/2020   Prediabetes 05/26/2020   Presence of prosthetic heart valve 05/26/2020   Sleep disorder 05/26/2020   Thrombocytopenia (HCDewart05/19/2022   Vitamin D deficiency 05/26/2020   Acquired dilation of ascending aorta and aortic root (HCC)    Symptomatic anemia 09/05/2018   Anemia due to chronic blood loss 09/05/2018   GI bleed 09/05/2018   Long term (current) use of anticoagulants 02/10/2018   History of mitral valve repair 01/30/2018   S/P tricuspid valve repair 01/30/2018   S/P Maze operation for atrial fibrillation 01/30/2018   Tricuspid regurgitation    Mitral valve regurgitation 10/14/2017  Trochanteric bursitis of right hip 11/27/2016   History of cerebrovascular accident 11/27/2016   Persistent atrial fibrillation    Hyperglycemia    Right middle cerebral artery stroke ([place]) 0000000   Acute embolic stroke (HCC)    Hypokalemia    Left hemiparesis (HCC)    Dysphagia, post-stroke    S/P AVR (aortic valve replacement)    Benign essential HTN    Diabetes mellitus type 2 in nonobese [facility])    Benign prostatic hyperplasia with lower urinary tract symptoms    A-fib ([place]) 10/28/2016   CVA (cerebral vascular accident) ([place]) 10/24/2016   Essential  hypertension    Gout    History of small bowel obstruction    History of BPH    S/P AVR 08/03/2010   Hypertrophy of prostate with urinary obstruction and other lower urinary tract symptoms (LUTS) 05/21/2001    Past Surgical History:  Procedure Laterality Date   BIOPSY  09/07/2018   Procedure: BIOPSY;  Surgeon: Otis Brace, MD;  Location: [facility];  Service: Gastroenterology;;   CARDIAC CATHETERIZATION  06-02-2010  dr Marlou Porch   non-obstructive cad- pLAD 30-40%/  normal LVSF/  severe AI   CARDIOVASCULAR STRESS TEST  04/12/2016   Low risk nuclear perfusion study w/ no significant reversible ischemia/  normal LV function and wall motion ,  stress ef 60%/  28m inferior and lateral scooped ST-segment depression w/ exercise (may be repolarization abnormality), exercise capacity was moderately reduced   CATARACT EXTRACTION W/ INTRAOCULAR LENS  IMPLANT, BILATERAL  02/2010   CLIPPING OF ATRIAL APPENDAGE N/A 01/30/2018   Procedure: CLIPPING OF LEFT ATRIAL APPENDAGE USING ATRICLIP PRO2 45MM;  Surgeon: ORexene Alberts MD;  [place]  Service: Open Heart Surgery;  Laterality: N/A;   COLONOSCOPY WITH PROPOFOL N/A 10/21/2018   Procedure: COLONOSCOPY WITH PROPOFOL;  Surgeon: BOtis Brace MD;  [facility]: [facility];  Service: Gastroenterology;  Laterality: N/A;   CYSTOSCOPY WITH LITHOLAPAXY N/A 06/05/2016   Procedure: CYSTOSCOPY WITH LITHOLAPAXY and fulgarization of bladder neck;  Surgeon: WIrine Seal MD;  [facility]  Service: Urology;  Laterality: N/A;   ESOPHAGOGASTRODUODENOSCOPY (EGD) WITH PROPOFOL N/A 09/07/2018   Procedure: ESOPHAGOGASTRODUODENOSCOPY (EGD) WITH PROPOFOL;  Surgeon: BOtis Brace MD;  [facility];  Service: Gastroenterology;  Laterality: N/A;   EXPLORATORY LAPARTOMY /  CHOLECYSTECTOMY  02/28/2005   for Small  bowel obstruction (mechnical)   IR ANGIO EXTRACRAN SEL COM CAROTID INNOMINATE UNI L MOD SED  10/24/2016   IR ANGIO  VERTEBRAL SEL SUBCLAVIAN INNOMINATE BILAT MOD SED  10/24/2016   IR PERCUTANEOUS ART THROMBECTOMY/INFUSION INTRACRANIAL INC DIAG ANGIO  10/24/2016   IR RADIOLOGIST EVAL & MGMT  12/05/2016   LEFT KNEE ARTHROSCOPY  2006   MAZE N/A 01/30/2018   Procedure: MAZE;  Surgeon: ORexene Alberts MD;  [place]  Service: Open Heart Surgery;  Laterality: N/A;   MITRAL VALVE REPAIR N/A 01/30/2018   Procedure: MITRAL VALVE REPAIR (MVR) USING CARBOMEDICS ANNULOFLEX SIZE 28;  Surgeon: ORexene Alberts MD;  [place]  Service: Open Heart Surgery;  Laterality: N/A;   POLYPECTOMY  10/21/2018   Procedure: POLYPECTOMY;  Surgeon: BOtis Brace MD;  [facility];  Service: Gastroenterology;;   RADIOLOGY WITH ANESTHESIA N/A 10/24/2016   Procedure: RADIOLOGY WITH ANESTHESIA;  Surgeon: DLuanne Bras MD;  [place]  Service: Radiology;  Laterality: N/A;   RIGHT FOOT SURGERY     RIGHT MINIATURE ANTERIOR THORACOTOMY FOR AORTIC VALVE REPLACEMENT AND CLOSURE PATENT FORAMEN  OVALE  08-03-2010  DR Levada Schilling Magna-ease pericardial tissue valve (2m)   RIGHT/LEFT HEART CATH AND CORONARY ANGIOGRAPHY N/A 10/14/2017   Procedure: RIGHT/LEFT HEART CATH AND CORONARY ANGIOGRAPHY;  Surgeon: CSherren Mocha MD;  [facility];  Service: Cardiovascular;  Laterality: N/A;   TEE WITHOUT CARDIOVERSION N/A 10/14/2017   Procedure: TRANSESOPHAGEAL ECHOCARDIOGRAM (TEE);  Surgeon: NJosue Hector MD;  [facility];  Service: Cardiovascular;  Laterality: N/A;   TRANSTHORACIC ECHOCARDIOGRAM  05/30/2016  dr skains   moderate  LVH ef 60-65%/  bioprothesis aortic valve present ,normal grandient and no AI /  mild MV calcification , moderate MR /  mild PR/ moderate TR/  PASP 348mg/ (RA denisty was identified 04-27-2016 echo) and is seen again today, this is likely a promient eustacian ridge, atrium is normal size   TRICUSPID VALVE REPLACEMENT N/A 01/30/2018   Procedure: TRICUSPID  VALVE REPAIR USING MC3 SIZE 28;  Surgeon: OwRexene AlbertsMD;  [place] [facility] Service: Open Heart Surgery;  Laterality: N/A;       Family History  Problem Relation Age of Onset   Heart disease Mother    Brain cancer Father     Social History   Tobacco Use   Smoking status: Never   Smokeless tobacco: Never  Vaping Use   Vaping Use: Never used  Substance Use Topics   Alcohol use: Yes    Comment: ONE OR TWO PER MONTH   Drug use: No    Home Medications Prior to Admission medications   Medication Sig Start Date End Date Taking? Authorizing Provider  allopurinol (ZYLOPRIM) 300 MG tablet Take 150 mg by mouth daily.    [provider]  amLODipine (NORVASC) 10 MG tablet Take 1 tablet (10 mg total) by mouth daily. 06/22/20 06/22/21  WeRichardson Dopp, PA-C  amoxicillin (AMOXIL) 500 MG capsule Take 2,000 mg by mouth See admin instructions. Take 2000 mg by mouth 1 hour prior to dental appointment    [provider]  atenolol (TENORMIN) 25 MG tablet Take 12.5 mg by mouth daily. 11/05/16   [provider]  b complex vitamins tablet Take 1 tablet by mouth daily.    [provider]  cholecalciferol (VITAMIN D3) 25 MCG (1000 UNIT) tablet Take 1,000 Units by mouth daily.    [provider]  Cinnamon 500 MG capsule Take 1,000 mg by mouth daily.    [provider]  ferrous sulfate 325 (65 FE) MG EC tablet Take 325 mg by mouth daily with breakfast. 10/29/19   [provider]  finasteride (PROSCAR) 5 MG tablet Take 1 tablet (5 mg total) by mouth daily. 11/05/16   Angiulli, DaLavon PaganiniPA-C  gabapentin (NEURONTIN) 100 MG capsule Take 100 mg by mouth daily. 05/03/20   [provider]  latanoprost (XALATAN) 0.005 % ophthalmic solution Place 1 drop into both eyes at bedtime.    [provider]  lisinopril (ZESTRIL) 10 MG tablet Take 1 tablet (10 mg total) by mouth daily. 06/22/20 06/22/21  WeRichardson Dopp, PA-C  tamsulosin  (FLOMAX) 0.4 MG CAPS capsule Take 2 capsules (0.8 mg total) by mouth daily after breakfast. 11/05/16   Angiulli, DaLavon PaganiniPA-C  warfarin (COUMADIN) 5 MG tablet TAKE 1 TO 1 AND 1/2 TABLETS BY MOUTH DAILY OR AS  DIRECTED BY ANTICOAGULATION CLINIC 03/21/20   SkJerline PainMD    Allergies    Sulfa antibiotics  Review of Systems   Review of  Systems  Constitutional:  Negative for appetite change and fatigue.  HENT:  Negative for congestion, ear discharge and sinus pressure.   Eyes:  Negative for discharge.  Respiratory:  Negative for cough.   Cardiovascular:  Negative for chest pain.  Gastrointestinal:  Negative for abdominal pain and diarrhea.  Genitourinary:  Negative for frequency and hematuria.  Musculoskeletal:  Negative for back pain.       Painful left hip  Skin:  Negative for rash.  Neurological:  Negative for seizures and headaches.  Psychiatric/Behavioral:  Negative for hallucinations.    Physical Exam Updated Vital Signs BP (!) 146/93   Pulse 80   Temp 98.3 F (36.8 C) (Oral)   Resp 12   Ht 6' (1.829 m)   Wt 79.4 kg   SpO2 98%   BMI 23.73 kg/m   Physical Exam Vitals and nursing note reviewed.  Constitutional:      Appearance: He is well-developed.  HENT:     Head: Normocephalic.     Nose: Nose normal.  Eyes:     General: No scleral icterus.    Conjunctiva/sclera: Conjunctivae normal.  Neck:     Thyroid: No thyromegaly.  Cardiovascular:     Rate and Rhythm: Normal rate and regular rhythm.     Heart sounds: No murmur heard.   No friction rub. No gallop.  Pulmonary:     Breath sounds: No stridor. No wheezing or rales.  Chest:     Chest wall: No tenderness.  Abdominal:     General: There is no distension.     Tenderness: There is no abdominal tenderness. There is no rebound.  Musculoskeletal:        General: Normal range of motion.     Cervical back: Neck supple.     Comments: Tender left hip  Lymphadenopathy:     Cervical: No cervical adenopathy.   Skin:    Findings: No erythema or rash.  Neurological:     Mental Status: He is alert and oriented to person, place, and time.     Motor: No abnormal muscle tone.     Coordination: Coordination normal.  Psychiatric:        Behavior: Behavior normal.    ED Results / Procedures / Treatments   Labs (all labs ordered are listed, but only abnormal results are displayed) Labs Reviewed  CBC WITH DIFFERENTIAL/PLATELET - Abnormal; Notable for the following components:      Result Value   WBC 13.0 (*)    RBC 3.97 (*)    HCT 38.3 (*)    Neutro Abs 11.0 (*)    All other components within normal limits  COMPREHENSIVE METABOLIC PANEL - Abnormal; Notable for the following components:   CO2 21 (*)    Glucose, Bld 140 (*)    All other components within normal limits  RESP PANEL BY RT-PCR (FLU A&B, COVID) [id number]  PROTIME-INR    EKG None  Radiology DG Hip Unilat W or Wo Pelvis 2-3 Views Left  Result Date: 08/01/2020 CLINICAL DATA:  85 year old male with fall and left hip pain. EXAM: DG HIP (WITH OR WITHOUT PELVIS) 2-3V LEFT COMPARISON:  None. FINDINGS: There is a mildly displaced fracture of the left femoral neck involving the basicervical region. The bones are osteopenic. No dislocation. Mild bilateral hip arthritic changes. There is degenerative changes of the lower lumbar spine. The soft tissues are unremarkable. Multiple surgical clips noted over the right hip. IMPRESSION: Mildly displaced fracture of the left femoral neck. No  dislocation. Electronically Signed   By: Anner Crete M.D.   On: 08/01/2020 19:24    Procedures Procedures   Medications Ordered in ED Medications  morphine 2 MG/ML injection 2 mg (has no administration in time range)  morphine 4 MG/ML injection 4 mg (4 mg Intravenous Given 08/01/20 1913)    ED Course  I have reviewed the triage vital signs and the nursing notes.  Pertinent labs & imaging results that were available during my care of the patient were  reviewed by me and considered in my medical decision making (see chart for details).    MDM Rules/Calculators/A&P                           Patient with a left femoral neck fracture is closed.  I spoke with Dr. Alvan Dame who will see the patient tomorrow and wanted the hospitalist to admit the patient Final Clinical Impression(s) / ED Diagnoses Final diagnoses:  None    Rx / DC Orders ED Discharge Orders     None        Milton Ferguson, MD 08/01/20 2054

## 2020-08-01 NOTE — H&P (Signed)
History and Physical    [name]. IR:[id number] DOB: [birth year] DOA: 08/01/2020  PCP: Marda Stalker, PA-C  Patient coming from: Home  I have personally briefly reviewed patient's old medical records in [facility].  Chief Complaint: left hip pain after a fall  HPI: [name]. is a 85 y.o. male with medical history significant for coronary artery disease, atrial fibrillation on warfarin, history of cardiac valve replacement/repair (aortic, mitral, and tricuspid), hypertension, gout, BPH, urinary retention, who presents to the emergency department on 08/01/2020 with left hip pain and inability to ambulate after a fall at home. Onset of patient's symptoms was 08/01/2020 and duration is constant. He was sitting playing bridge and his left foot fell asleep; when he stood up his foot/leg did not support him and he fell down onto his left hip. Pain is located the left hip and sometimes radiates to rest of left leg. He does not have any pain in his left ankle. He did not his his head or neck when he fell. It is up to 8/10 at times and is characterized as achy. Symptoms are alleviated by nothing and exacerbated by moving his leg. Associated symptoms: No headache, slurred speech, or focal weakness. No numbness except his chronic neuropathy. No dysuria or hematuria. No fever or chills.   ED Course: X-ray revealed a mildly displaced fracture of the left femoral neck.  Labs included WBCs 13.  Review of Systems: As per HPI otherwise all other systems reviewed and are unremarable.  RESPIRATORY: No cough, wheezing, or shortness of breath.  CARDIOVASCULAR: No chest pain or palpitations.    Past Medical History:  Diagnosis Date   Acquired dilation of ascending aorta and aortic root (HCC)    39m aortic root and 432mascending aorta on echo 03/2020   Bladder stones    Borderline diabetes    BPH (benign prostatic hyperplasia)    Coronary artery disease    cardiologist-  dr  skNat Matherhart NP--- per cath 06-02-2010 non-obstructive cad pLAD 30-40%   Diverticulosis of colon    Dysrhythmia    afib   Gout    Heart murmur    History of adenomatous polyp of colon    2002-- tubular adenoma   History of aortic insufficiency    severe -- s/p  AVR 08-03-2010   History of small bowel obstruction    02/ 2007 mechanical sbo s/p  surgical intervention;  partial sbo 09/ 2011 and 03-20-2011 resolved without surgical intervention   History of urinary retention    HTN (hypertension)    Peripheral neuropathy    Persistent atrial fibrillation (HCC)    S/P aortic valve replacement with prosthetic valve 08/03/2010   tissue valve   S/P Maze operation for atrial fibrillation 01/30/2018   Complete bilateral atrial lesion set using bipolar radiofrequency and cryothermy with clipping of LA appendage   S/P MVR (mitral valve repair) 01/30/2018   Complex valvuloplasty including artificial Gore-tex neochord placement x4 and Carbo medics Annuloflex ring annuloplasty, size 28   S/P patent foramen ovale closure 08/03/2010   at same time AVR   S/P tricuspid valve repair 01/30/2018   Using an MC3 Annuloplasty ring, size 28   [facility]   Tricuspid regurgitation     Past Surgical History:  Procedure Laterality Date   BIOPSY  09/07/2018   Procedure: BIOPSY;  Surgeon: BrOtis BraceMD;  [facility];  Service: Gastroenterology;;   CARDIAC CATHETERIZATION  06-02-2010  dr  skains   non-obstructive cad- pLAD 30-40%/  normal LVSF/  severe AI   CARDIOVASCULAR STRESS TEST  04/12/2016   Low risk nuclear perfusion study w/ no significant reversible ischemia/  normal LV function and wall motion ,  stress ef 60%/  69m inferior and lateral scooped ST-segment depression w/ exercise (may be repolarization abnormality), exercise capacity was moderately reduced   CATARACT EXTRACTION W/ INTRAOCULAR LENS  IMPLANT, BILATERAL  02/2010   CLIPPING OF ATRIAL APPENDAGE N/A 01/30/2018    Procedure: CLIPPING OF LEFT ATRIAL APPENDAGE USING ATRICLIP PRO2 45MM;  Surgeon: ORexene Alberts MD;  [place]  Service: Open Heart Surgery;  Laterality: N/A;   COLONOSCOPY WITH PROPOFOL N/A 10/21/2018   Procedure: COLONOSCOPY WITH PROPOFOL;  Surgeon: BOtis Brace MD;  [facility];  Service: Gastroenterology;  Laterality: N/A;   CYSTOSCOPY WITH LITHOLAPAXY N/A 06/05/2016   Procedure: CYSTOSCOPY WITH LITHOLAPAXY and fulgarization of bladder neck;  Surgeon: WIrine Seal MD;  [facility]  Service: Urology;  Laterality: N/A;   ESOPHAGOGASTRODUODENOSCOPY (EGD) WITH PROPOFOL N/A 09/07/2018   Procedure: ESOPHAGOGASTRODUODENOSCOPY (EGD) WITH PROPOFOL;  Surgeon: BOtis Brace MD;  [facility];  Service: Gastroenterology;  Laterality: N/A;   EXPLORATORY LAPARTOMY /  CHOLECYSTECTOMY  02/28/2005   for Small  bowel obstruction (mechnical)   IR ANGIO EXTRACRAN SEL COM CAROTID INNOMINATE UNI L MOD SED  10/24/2016   IR ANGIO VERTEBRAL SEL SUBCLAVIAN INNOMINATE BILAT MOD SED  10/24/2016   IR PERCUTANEOUS ART THROMBECTOMY/INFUSION INTRACRANIAL INC DIAG ANGIO  10/24/2016   IR RADIOLOGIST EVAL & MGMT  12/05/2016   LEFT KNEE ARTHROSCOPY  2006   MAZE N/A 01/30/2018   Procedure: MAZE;  Surgeon: ORexene Alberts MD;  [place]  Service: Open Heart Surgery;  Laterality: N/A;   MITRAL VALVE REPAIR N/A 01/30/2018   Procedure: MITRAL VALVE REPAIR (MVR) USING CARBOMEDICS ANNULOFLEX SIZE 28;  Surgeon: ORexene Alberts MD;  [place]  Service: Open Heart Surgery;  Laterality: N/A;   POLYPECTOMY  10/21/2018   Procedure: POLYPECTOMY;  Surgeon: BOtis Brace MD;  [facility];  Service: Gastroenterology;;   RADIOLOGY WITH ANESTHESIA N/A 10/24/2016   Procedure: RADIOLOGY WITH ANESTHESIA;  Surgeon: DLuanne Bras MD;  [place]  Service: Radiology;  Laterality: N/A;   RIGHT FOOT SURGERY     RIGHT MINIATURE ANTERIOR  THORACOTOMY FOR AORTIC VALVE REPLACEMENT AND CLOSURE PATENT FORAMEN OVALE  08-03-2010  DR OLevada SchillingMagna-ease pericardial tissue valve (291m   RIGHT/LEFT HEART CATH AND CORONARY ANGIOGRAPHY N/A 10/14/2017   Procedure: RIGHT/LEFT HEART CATH AND CORONARY ANGIOGRAPHY;  Surgeon: CoSherren MochaMD;  [facility];  Service: Cardiovascular;  Laterality: N/A;   TEE WITHOUT CARDIOVERSION N/A 10/14/2017   Procedure: TRANSESOPHAGEAL ECHOCARDIOGRAM (TEE);  Surgeon: NiJosue HectorMD;  [facility];  Service: Cardiovascular;  Laterality: N/A;   TRANSTHORACIC ECHOCARDIOGRAM  05/30/2016  dr skains   moderate  LVH ef 60-65%/  bioprothesis aortic valve present ,normal grandient and no AI /  mild MV calcification , moderate MR /  mild PR/ moderate TR/  PASP 3580m/ (RA denisty was identified 04-27-2016 echo) and is seen again today, this is likely a promient eustacian ridge, atrium is normal size   TRICUSPID VALVE REPLACEMENT N/A 01/30/2018   Procedure: TRICUSPID VALVE REPAIR USING MC3 SIZE 28;  Surgeon: OweRexene AlbertsD;  [facility] [place];  Laterality: N/A;    Social History  reports that he has never smoked. He has never used smokeless tobacco. He reports current alcohol use. He reports that he does not use drugs.  Allergies  Allergen Reactions   Sulfa Antibiotics Other (See Comments)    Granulocytosis    Family History  Problem Relation Age of Onset   Heart disease Mother    Brain cancer Father      Home Medications  Prior to Admission medications   Medication Sig Start Date End Date Taking? Authorizing Provider  allopurinol (ZYLOPRIM) 300 MG tablet Take 150 mg by mouth daily.    [provider]  amLODipine (NORVASC) 10 MG tablet Take 1 tablet (10 mg total) by mouth daily. 06/22/20 06/22/21  [name] T, PA-C  amoxicillin (AMOXIL) 500 MG capsule Take 2,000 mg by mouth See admin instructions. Take 2000 mg by mouth 1 hour  prior to dental appointment    [provider]  atenolol (TENORMIN) 25 MG tablet Take 12.5 mg by mouth daily. 11/05/16   [provider]  b complex vitamins tablet Take 1 tablet by mouth daily.    [provider]  cholecalciferol (VITAMIN D3) 25 MCG (1000 UNIT) tablet Take 1,000 Units by mouth daily.    [provider]  Cinnamon 500 MG capsule Take 1,000 mg by mouth daily.    [provider]  ferrous sulfate 325 (65 FE) MG EC tablet Take 325 mg by mouth daily with breakfast. 10/29/19   [provider]  finasteride (PROSCAR) 5 MG tablet Take 1 tablet (5 mg total) by mouth daily. 11/05/16   Angiulli, Lavon Paganini, PA-C  gabapentin (NEURONTIN) 100 MG capsule Take 100 mg by mouth daily. 05/03/20   [provider]  latanoprost (XALATAN) 0.005 % ophthalmic solution Place 1 drop into both eyes at bedtime.    [provider]  lisinopril (ZESTRIL) 10 MG tablet Take 1 tablet (10 mg total) by mouth daily. 06/22/20 06/22/21  [name] T, PA-C  tamsulosin (FLOMAX) 0.4 MG CAPS capsule Take 2 capsules (0.8 mg total) by mouth daily after breakfast. 11/05/16   Angiulli, Lavon Paganini, PA-C  warfarin (COUMADIN) 5 MG tablet TAKE 1 TO 1 AND 1/2 TABLETS BY MOUTH DAILY OR AS  DIRECTED BY ANTICOAGULATION CLINIC 03/21/20   Jerline Pain, MD    Physical Exam: Vitals:   08/01/20 1731 08/01/20 1800 08/01/20 1900 08/01/20 2000  BP: (!) 156/99 (!) 144/85 (!) 146/84 (!) 146/93  Pulse: 82 79 77 80  Resp: '17 11 17 12  '$ Temp: 98.3 F (36.8 C)     TempSrc: Oral     SpO2: 98% 97% 96% 98%  Weight:      Height:        Constitutional: NAD, calm, comfortable, ill-appearing. Vitals:   08/01/20 1731 08/01/20 1800 08/01/20 1900 08/01/20 2000  BP: (!) 156/99 (!) 144/85 (!) 146/84 (!) 146/93  Pulse: 82 79 77 80  Resp: '17 11 17 12  '$ Temp: 98.3 F (36.8 C)     TempSrc: Oral     SpO2: 98% 97% 96% 98%  Weight:      Height:       Eyes: Pupils equal and round,  lids and conjunctivae without icterus or erythema. ENMT: Mucous membranes are dry. Posterior pharynx clear of any exudate or lesions. Nares patent without discharge or bleeding.  Normocephalic, atraumatic.  Normal dentition.  Neck: normal, supple, no masses, trachea midline.  Thyroid nontender, no masses appreciated, no thyromegaly. Respiratory: clear to auscultation bilaterally. Chest wall  movements are symmetric. No wheezing, no crackles.  No rhonchi.  Normal respiratory effort. No accessory muscle use.  Cardiovascular: Regular rate and rhythm, no rubs / gallops. Pulses: DP pulses 2+ bilaterally. No carotid bruits.  Capillary refill less than 3 seconds. Edema: None bilaterally. Murmur 2/6 systolic.  GI: soft, non-distended, normal active bowel sounds. No hepatosplenomegaly. No rigidity, rebound, or guarding. Non-tender. No masses palpated.  Musculoskeletal: no clubbing / cyanosis. No joint deformity upper and lower extremities. Good ROM, no contractures. Normal muscle tone.  No tenderness or deformity in the back bilaterally. Tenderness in left posterior hip. Integument: no rashes, lesions, ulcers. No induration. Clean, dry, intact. Neurologic: CN 2-12 grossly intact. Sensation grossly intact to light touch. DTR 2+ bilaterally.  Babinski: Toes downgoing bilaterally.  Strength 5/5 in all 4.  Intact rapid alternating movements bilaterally.  No pronator drift. Psychiatric: Normal judgment and insight. Alert and oriented x 3. Normal mood.  Normal and appropriate affect. Lymphatic: No cervical lymphadenopathy. No supraclavicular lymphadenopathy.   Labs on Admission: I have personally reviewed the following labs and imaging studies.  CBC: Recent Labs  Lab 08/01/20 1809  WBC 13.0*  NEUTROABS 11.0*  HGB 13.1  HCT 38.3*  MCV 96.5  PLT 99991111    Basic Metabolic Panel: Recent Labs  Lab 08/01/20 1809  NA 138  K 3.9  CL 106  CO2 21*  GLUCOSE 140*  BUN 19  CREATININE 0.86  CALCIUM 9.3     GFR: Estimated Creatinine Clearance: 71.4 mL/min (by C-G formula based on SCr of 0.86 mg/dL).  Liver Function Tests: Recent Labs  Lab 08/01/20 1809  AST 23  ALT 17  ALKPHOS 56  BILITOT 0.8  PROT 7.8  ALBUMIN 4.5     Radiological Exams on Admission: DG Hip Unilat W or Wo Pelvis 2-3 Views Left  Result Date: 08/01/2020 CLINICAL DATA:  85 year old male with fall and left hip pain. EXAM: DG HIP (WITH OR WITHOUT PELVIS) 2-3V LEFT COMPARISON:  None. FINDINGS: There is a mildly displaced fracture of the left femoral neck involving the basicervical region. The bones are osteopenic. No dislocation. Mild bilateral hip arthritic changes. There is degenerative changes of the lower lumbar spine. The soft tissues are unremarkable. Multiple surgical clips noted over the right hip. IMPRESSION: Mildly displaced fracture of the left femoral neck. No dislocation. Electronically Signed   By: Anner Crete M.D.   On: 08/01/2020 19:24    EKG: Independently reviewed. 81 bpm.  Atrial fibrillation.  Flat T wave in lead I.  Assessment/Plan Principal Problem:   Closed left hip fracture, initial encounter ([place]) Active Problems:   A-fib (HCC)   Long term current use of anticoagulant   Peripheral neuropathy   Essential hypertension   S/P AVR (aortic valve replacement)   Gout   Benign prostatic hyperplasia with lower urinary tract symptoms    Principal Problem:    Closed left hip fracture, initial encounter ([place]) Plan: IV morphine as needed.  Orthopedic surgery consult.  Hip fracture order set.  Patient is alert and oriented and able to give consent.  Hold home warfarin and check INR daily.  We will give IV vitamin K x1.   Active Problems:    Atrial fibrillation ([place]) Plan: Continue home atenolol.  Holding warfarin due to planned surgery.    Long term current use of anticoagulant Indication: Atrial fibrillation.  Plan: Hold warfarin due to need for surgery.  Check INR daily.  Will give IV  vitamin K x1.  Restart warfarin  once surgeon feels it is safe to do so.    Peripheral neuropathy Plan: Continue home gabapentin and outpatient follow-up    Essential hypertension Plan: Continue home medications but claims hold parameters if systolic blood pressure is less than 120.    S/P AVR (aortic valve replacement) Plan: Noted.  Continue outpatient follow-up, routine.    Gout Plan: Continue home allopurinol.    Benign prostatic hyperplasia with lower urinary tract symptoms Plan: Continue home medications.      DVT prophylaxis: SCDs.  Code Status:   Full Code   Disposition Plan:   Patient is from:  Home  Anticipated DC to:  Home  Anticipated DC date:  08/06/20  Anticipated DC barriers: Hip pain  Consults called:  ED physician contacted Dr. Alvan Dame, orthopedic surgeon  Admission status:  Inpatient   Severity of Illness: The appropriate patient status for this patient is INPATIENT. Inpatient status is judged to be reasonable and necessary in order to provide the required intensity of service to ensure the patient's safety. The patient's presenting symptoms, physical exam findings, and initial radiographic and laboratory data in the context of their chronic comorbidities is felt to place them at high risk for further clinical deterioration. Furthermore, it is not anticipated that the patient will be medically stable for discharge from the hospital within 2 midnights of admission. The following factors support the patient status of inpatient.   " The patient's presenting symptoms include severe left hip pain. " The worrisome physical exam findings include left hip tenderness. " The initial radiographic and laboratory data are worrisome because of left hip fracture. " The chronic co-morbidities include atrial fibrillation on warfarin, replacement/repair of multiple cardiac valves.   * I certify that at the point of admission it is my clinical judgment that the patient will require  inpatient hospital care spanning beyond 2 midnights from the point of admission due to high intensity of service, high risk for further deterioration and high frequency of surveillance required.Tacey Ruiz MD [facility]  How to contact the [facility] Attending or Consulting provider [place] or covering provider during after hours [place], for this patient?   Check the care team in [facility] and look for a) attending/consulting [facility] provider listed and b) the [facility] team listed Log into [email or web address] and use [place]'s universal password to access. If you do not have the password, please contact the hospital operator. Locate the [facility] provider you are looking for under [facility] and page to a number that you can be directly reached. If you still have difficulty reaching the provider, please page the [facility] (Director on Call) for the Hospitalists listed on amion for assistance.  08/01/2020, 8:15 PM

## 2020-08-02 ENCOUNTER — Inpatient Hospital Stay (HOSPITAL_COMMUNITY): Payer: Medicare Other | Admitting: Anesthesiology

## 2020-08-02 ENCOUNTER — Inpatient Hospital Stay (HOSPITAL_COMMUNITY): Payer: Medicare Other

## 2020-08-02 ENCOUNTER — Encounter (HOSPITAL_COMMUNITY)
Admission: EM | Disposition: A | Discharge status: Home Health | Payer: Self-pay | Source: Home / Self Care | Attending: Internal Medicine

## 2020-08-02 ENCOUNTER — Encounter (HOSPITAL_COMMUNITY): Payer: Self-pay | Admitting: Internal Medicine

## 2020-08-02 DIAGNOSIS — S72002A Fracture of unspecified part of neck of left femur, initial encounter for closed fracture: Secondary | ICD-10-CM | POA: Diagnosis not present

## 2020-08-02 DIAGNOSIS — E440 Moderate protein-calorie malnutrition: Secondary | ICD-10-CM | POA: Insufficient documentation

## 2020-08-02 DIAGNOSIS — Z96649 Presence of unspecified artificial hip joint: Secondary | ICD-10-CM

## 2020-08-02 HISTORY — PX: TOTAL HIP ARTHROPLASTY: SHX124

## 2020-08-02 LAB — BASIC METABOLIC PANEL
Anion gap: 8 (ref 5–15)
BUN: 15 mg/dL (ref 8–23)
CO2: 24 mmol/L (ref 22–32)
Calcium: 9.1 mg/dL (ref 8.9–10.3)
Chloride: 108 mmol/L (ref 98–111)
Creatinine, Ser: 0.81 mg/dL (ref 0.61–1.24)
GFR, Estimated: >60 mL/min (ref >60)
Glucose, Bld: 108 mg/dL — ABNORMAL HIGH (ref 70–99)
Potassium: 3.6 mmol/L (ref 3.5–5.1)
Sodium: 140 mmol/L (ref 135–145)

## 2020-08-02 LAB — GLUCOSE, CAPILLARY: Glucose-Capillary: 121 mg/dL — ABNORMAL HIGH (ref 70–99)

## 2020-08-02 LAB — TYPE AND SCREEN
ABO/RH(D): O POS
Antibody Screen: NEGATIVE

## 2020-08-02 LAB — CBC
HCT: 35.5 % — ABNORMAL LOW (ref 39.0–52.0)
Hemoglobin: 12.1 g/dL — ABNORMAL LOW (ref 13.0–17.0)
MCH: 33 pg (ref 26.0–34.0)
MCHC: 34.1 g/dL (ref 30.0–36.0)
MCV: 96.7 fL (ref 80.0–100.0)
Platelets: 139 10*3/uL — ABNORMAL LOW (ref 150–400)
RBC: 3.67 MIL/uL — ABNORMAL LOW (ref 4.22–5.81)
RDW: 13.4 % (ref 11.5–15.5)
WBC: 9.9 10*3/uL (ref 4.0–10.5)
nRBC: 0 % (ref 0.0–0.2)

## 2020-08-02 LAB — SURGICAL PCR SCREEN
MRSA, PCR: NEGATIVE
Staphylococcus aureus: NEGATIVE

## 2020-08-02 LAB — PROTIME-INR
INR: 2.4 — ABNORMAL HIGH (ref 0.8–1.2)
Prothrombin Time: 26.1 seconds — ABNORMAL HIGH (ref 11.4–15.2)

## 2020-08-02 SURGERY — ARTHROPLASTY, HIP, TOTAL, ANTERIOR APPROACH
Anesthesia: General | Site: Hip | Laterality: Left

## 2020-08-02 MED ORDER — LIDOCAINE 2% (20 MG/ML) 5 ML SYRINGE
INTRAMUSCULAR | Status: AC
  Filled 2020-08-02: qty 5

## 2020-08-02 MED ORDER — VITAMIN D 25 MCG (1000 UNIT) PO TABS
1000.0000 [IU] | ORAL_TABLET | Freq: Every morning | ORAL | Status: DC
  Administered 2020-08-03 – 2020-08-05 (×3): 1000 [IU] via ORAL
  Filled 2020-08-02 (×3): qty 1

## 2020-08-02 MED ORDER — GABAPENTIN 100 MG PO CAPS
100.0000 mg | ORAL_CAPSULE | Freq: Every day | ORAL | Status: DC
  Administered 2020-08-02 – 2020-08-05 (×4): 100 mg via ORAL
  Filled 2020-08-02 (×4): qty 1

## 2020-08-02 MED ORDER — LACTATED RINGERS IV SOLN: INTRAVENOUS | Status: DC | PRN

## 2020-08-02 MED ORDER — VITAMIN K1 10 MG/ML IJ SOLN
5.0000 mg | Freq: Once | INTRAVENOUS | Status: AC
  Administered 2020-08-02: 5 mg via INTRAVENOUS
  Filled 2020-08-02: qty 0.5

## 2020-08-02 MED ORDER — ONDANSETRON HCL 4 MG/2ML IJ SOLN: 4.0000 mg | Freq: Four times a day (QID) | INTRAMUSCULAR | Status: DC | PRN

## 2020-08-02 MED ORDER — DEXAMETHASONE SODIUM PHOSPHATE 10 MG/ML IJ SOLN
INTRAMUSCULAR | Status: DC | PRN
  Administered 2020-08-02: 10 mg via INTRAVENOUS

## 2020-08-02 MED ORDER — ATENOLOL 25 MG PO TABS
12.5000 mg | ORAL_TABLET | Freq: Every morning | ORAL | Status: DC
  Administered 2020-08-02 – 2020-08-05 (×4): 12.5 mg via ORAL
  Filled 2020-08-02 (×4): qty 1

## 2020-08-02 MED ORDER — LIDOCAINE HCL (PF) 1 % IJ SOLN
INTRAMUSCULAR | Status: DC | PRN
  Administered 2020-08-02: 10 mL

## 2020-08-02 MED ORDER — VITAMIN K1 10 MG/ML IJ SOLN
20.0000 mg | INTRAVENOUS | Status: AC
  Administered 2020-08-02: 20 mg via INTRAVENOUS
  Filled 2020-08-02: qty 2

## 2020-08-02 MED ORDER — DEXAMETHASONE SODIUM PHOSPHATE 10 MG/ML IJ SOLN
10.0000 mg | Freq: Once | INTRAMUSCULAR | Status: AC
  Administered 2020-08-03: 10 mg via INTRAVENOUS
  Filled 2020-08-02: qty 1

## 2020-08-02 MED ORDER — PROPOFOL 10 MG/ML IV BOLUS
INTRAVENOUS | Status: AC
  Filled 2020-08-02: qty 20

## 2020-08-02 MED ORDER — DEXAMETHASONE SODIUM PHOSPHATE 10 MG/ML IJ SOLN
INTRAMUSCULAR | Status: AC
  Filled 2020-08-02: qty 1

## 2020-08-02 MED ORDER — DIPHENHYDRAMINE HCL 12.5 MG/5ML PO ELIX: 12.5000 mg | ORAL_SOLUTION | ORAL | Status: DC | PRN

## 2020-08-02 MED ORDER — METHYLPREDNISOLONE ACETATE 40 MG/ML IJ SUSP
INTRAMUSCULAR | Status: DC | PRN
  Administered 2020-08-02: 80 mg via INTRA_ARTICULAR

## 2020-08-02 MED ORDER — SODIUM CHLORIDE 0.9 % IR SOLN
Status: DC | PRN
  Administered 2020-08-02: 1000 mL

## 2020-08-02 MED ORDER — FENTANYL CITRATE (PF) 100 MCG/2ML IJ SOLN
INTRAMUSCULAR | Status: AC
  Filled 2020-08-02: qty 2

## 2020-08-02 MED ORDER — PROPOFOL 10 MG/ML IV BOLUS
INTRAVENOUS | Status: DC | PRN
  Administered 2020-08-02: 70 mg via INTRAVENOUS

## 2020-08-02 MED ORDER — ALLOPURINOL 300 MG PO TABS
150.0000 mg | ORAL_TABLET | Freq: Every morning | ORAL | Status: DC
  Administered 2020-08-02 – 2020-08-05 (×4): 150 mg via ORAL
  Filled 2020-08-02 (×4): qty 1

## 2020-08-02 MED ORDER — ENSURE ENLIVE PO LIQD
237.0000 mL | Freq: Two times a day (BID) | ORAL | Status: DC
  Administered 2020-08-03 – 2020-08-05 (×2): 237 mL via ORAL

## 2020-08-02 MED ORDER — AMLODIPINE BESYLATE 10 MG PO TABS
10.0000 mg | ORAL_TABLET | Freq: Every day | ORAL | Status: DC
  Administered 2020-08-02 – 2020-08-05 (×4): 10 mg via ORAL
  Filled 2020-08-02 (×4): qty 1

## 2020-08-02 MED ORDER — LACTATED RINGERS IV SOLN: INTRAVENOUS | Status: DC

## 2020-08-02 MED ORDER — TAMSULOSIN HCL 0.4 MG PO CAPS
0.8000 mg | ORAL_CAPSULE | Freq: Every day | ORAL | Status: DC
  Administered 2020-08-02 – 2020-08-05 (×4): 0.8 mg via ORAL
  Filled 2020-08-02 (×4): qty 2

## 2020-08-02 MED ORDER — LIDOCAINE 2% (20 MG/ML) 5 ML SYRINGE
INTRAMUSCULAR | Status: DC | PRN
  Administered 2020-08-02: 60 mg via INTRAVENOUS

## 2020-08-02 MED ORDER — TRANEXAMIC ACID-NACL 1000-0.7 MG/100ML-% IV SOLN
1000.0000 mg | INTRAVENOUS | Status: AC
  Administered 2020-08-02: 1000 mg via INTRAVENOUS
  Filled 2020-08-02: qty 100

## 2020-08-02 MED ORDER — SODIUM CHLORIDE 0.9 % IV SOLN
INTRAVENOUS | Status: DC | PRN
  Administered 2020-08-02: 250 mL via INTRAVENOUS
  Administered 2020-08-02: 1000 mL via INTRAVENOUS

## 2020-08-02 MED ORDER — HYDROMORPHONE HCL 1 MG/ML IJ SOLN: 0.2500 mg | INTRAMUSCULAR | Status: DC | PRN

## 2020-08-02 MED ORDER — SODIUM CHLORIDE 0.9 % IV SOLN
2.0000 g | INTRAVENOUS | Status: AC
  Administered 2020-08-02: 2 g via INTRAVENOUS

## 2020-08-02 MED ORDER — B COMPLEX-C PO TABS
1.0000 | ORAL_TABLET | Freq: Every morning | ORAL | Status: DC
  Administered 2020-08-03 – 2020-08-05 (×3): 1 via ORAL
  Filled 2020-08-02 (×4): qty 1

## 2020-08-02 MED ORDER — EPHEDRINE SULFATE-NACL 50-0.9 MG/10ML-% IV SOSY
PREFILLED_SYRINGE | INTRAVENOUS | Status: DC | PRN
  Administered 2020-08-02: 10 mg via INTRAVENOUS
  Administered 2020-08-02: 5 mg via INTRAVENOUS
  Administered 2020-08-02: 10 mg via INTRAVENOUS

## 2020-08-02 MED ORDER — DOCUSATE SODIUM 100 MG PO CAPS
100.0000 mg | ORAL_CAPSULE | Freq: Two times a day (BID) | ORAL | Status: DC
  Administered 2020-08-02 – 2020-08-05 (×6): 100 mg via ORAL
  Filled 2020-08-02 (×6): qty 1

## 2020-08-02 MED ORDER — PHENYLEPHRINE 40 MCG/ML (10ML) SYRINGE FOR IV PUSH (FOR BLOOD PRESSURE SUPPORT)
PREFILLED_SYRINGE | INTRAVENOUS | Status: DC | PRN
  Administered 2020-08-02: 80 ug via INTRAVENOUS

## 2020-08-02 MED ORDER — ONDANSETRON HCL 4 MG/2ML IJ SOLN
INTRAMUSCULAR | Status: AC
  Filled 2020-08-02: qty 2

## 2020-08-02 MED ORDER — ACETAMINOPHEN 325 MG PO TABS
650.0000 mg | ORAL_TABLET | ORAL | Status: DC | PRN
  Administered 2020-08-02: 650 mg via ORAL
  Filled 2020-08-02 (×2): qty 2

## 2020-08-02 MED ORDER — METHYLPREDNISOLONE ACETATE 40 MG/ML IJ SUSP
INTRAMUSCULAR | Status: AC
  Filled 2020-08-02: qty 1

## 2020-08-02 MED ORDER — SODIUM CHLORIDE 0.9 % IV SOLN: INTRAVENOUS | Status: DC

## 2020-08-02 MED ORDER — SENNOSIDES-DOCUSATE SODIUM 8.6-50 MG PO TABS: 1.0000 | ORAL_TABLET | Freq: Every evening | ORAL | Status: DC | PRN

## 2020-08-02 MED ORDER — ASPIRIN 81 MG PO CHEW
81.0000 mg | CHEWABLE_TABLET | Freq: Two times a day (BID) | ORAL | Status: DC
  Administered 2020-08-02 – 2020-08-04 (×3): 81 mg via ORAL
  Filled 2020-08-02 (×3): qty 1

## 2020-08-02 MED ORDER — ROCURONIUM BROMIDE 10 MG/ML (PF) SYRINGE
PREFILLED_SYRINGE | INTRAVENOUS | Status: AC
  Filled 2020-08-02: qty 10

## 2020-08-02 MED ORDER — BISACODYL 10 MG RE SUPP: 10.0000 mg | Freq: Every day | RECTAL | Status: DC | PRN

## 2020-08-02 MED ORDER — POLYETHYLENE GLYCOL 3350 17 G PO PACK: 17.0000 g | PACK | Freq: Every day | ORAL | Status: DC | PRN

## 2020-08-02 MED ORDER — CHLORHEXIDINE GLUCONATE 4 % EX LIQD
60.0000 mL | Freq: Once | CUTANEOUS | Status: AC
  Administered 2020-08-02: 4 via TOPICAL

## 2020-08-02 MED ORDER — LISINOPRIL 10 MG PO TABS
10.0000 mg | ORAL_TABLET | Freq: Every day | ORAL | Status: DC
  Administered 2020-08-02 – 2020-08-05 (×4): 10 mg via ORAL
  Filled 2020-08-02 (×4): qty 1

## 2020-08-02 MED ORDER — POVIDONE-IODINE 10 % EX SWAB
2.0000 "application " | Freq: Once | CUTANEOUS | Status: AC
  Administered 2020-08-02: 2 via TOPICAL

## 2020-08-02 MED ORDER — BISACODYL 5 MG PO TBEC: 5.0000 mg | DELAYED_RELEASE_TABLET | Freq: Every day | ORAL | Status: DC | PRN

## 2020-08-02 MED ORDER — PHENYLEPHRINE 40 MCG/ML (10ML) SYRINGE FOR IV PUSH (FOR BLOOD PRESSURE SUPPORT)
PREFILLED_SYRINGE | INTRAVENOUS | Status: AC
  Filled 2020-08-02: qty 10

## 2020-08-02 MED ORDER — SUGAMMADEX SODIUM 200 MG/2ML IV SOLN
INTRAVENOUS | Status: DC | PRN
  Administered 2020-08-02: 200 mg via INTRAVENOUS

## 2020-08-02 MED ORDER — LIDOCAINE HCL (PF) 1 % IJ SOLN
INTRAMUSCULAR | Status: AC
  Filled 2020-08-02: qty 30

## 2020-08-02 MED ORDER — ROCURONIUM BROMIDE 10 MG/ML (PF) SYRINGE
PREFILLED_SYRINGE | INTRAVENOUS | Status: DC | PRN
  Administered 2020-08-02: 60 mg via INTRAVENOUS

## 2020-08-02 MED ORDER — ONDANSETRON HCL 4 MG/2ML IJ SOLN: 4.0000 mg | Freq: Once | INTRAMUSCULAR | Status: DC | PRN

## 2020-08-02 MED ORDER — FINASTERIDE 5 MG PO TABS
5.0000 mg | ORAL_TABLET | Freq: Every day | ORAL | Status: DC
  Administered 2020-08-02 – 2020-08-05 (×4): 5 mg via ORAL
  Filled 2020-08-02 (×4): qty 1

## 2020-08-02 MED ORDER — FENTANYL CITRATE (PF) 100 MCG/2ML IJ SOLN
INTRAMUSCULAR | Status: DC | PRN
  Administered 2020-08-02 (×4): 50 ug via INTRAVENOUS

## 2020-08-02 MED ORDER — MENTHOL 3 MG MT LOZG: 1.0000 | LOZENGE | OROMUCOSAL | Status: DC | PRN

## 2020-08-02 MED ORDER — STERILE WATER FOR IRRIGATION IR SOLN
Status: DC | PRN
  Administered 2020-08-02: 2000 mL

## 2020-08-02 MED ORDER — ONDANSETRON HCL 4 MG/2ML IJ SOLN
INTRAMUSCULAR | Status: DC | PRN
  Administered 2020-08-02: 4 mg via INTRAVENOUS

## 2020-08-02 MED ORDER — PHENOL 1.4 % MT LIQD: 1.0000 | OROMUCOSAL | Status: DC | PRN

## 2020-08-02 MED ORDER — ONDANSETRON HCL 4 MG PO TABS: 4.0000 mg | ORAL_TABLET | Freq: Four times a day (QID) | ORAL | Status: DC | PRN

## 2020-08-02 MED ORDER — FERROUS SULFATE 325 (65 FE) MG PO TABS
325.0000 mg | ORAL_TABLET | Freq: Every morning | ORAL | Status: DC
  Administered 2020-08-03 – 2020-08-05 (×3): 325 mg via ORAL
  Filled 2020-08-02 (×3): qty 1

## 2020-08-02 MED ORDER — METHYLPREDNISOLONE ACETATE 40 MG/ML IJ SUSP
40.0000 mg | Freq: Once | INTRAMUSCULAR | Status: DC
  Filled 2020-08-02: qty 1

## 2020-08-02 MED ORDER — METOPROLOL TARTRATE 5 MG/5ML IV SOLN: 5.0000 mg | Freq: Four times a day (QID) | INTRAVENOUS | Status: DC | PRN

## 2020-08-02 SURGICAL SUPPLY — 40 items
ARTICULEZE HEAD (Hips) ×2 IMPLANT
BAG COUNTER SPONGE SURGICOUNT (BAG) ×2 IMPLANT
BAG DECANTER FOR FLEXI CONT (MISCELLANEOUS) IMPLANT
BAG ZIPLOCK 12X15 (MISCELLANEOUS) IMPLANT
BLADE SAG 18X100X1.27 (BLADE) ×2 IMPLANT
COVER PERINEAL POST (MISCELLANEOUS) ×2 IMPLANT
COVER SURGICAL LIGHT HANDLE (MISCELLANEOUS) ×2 IMPLANT
CUP ACETBLR 52 OD PINNACLE (Hips) ×2 IMPLANT
DERMABOND ADVANCED (GAUZE/BANDAGES/DRESSINGS) ×1
DERMABOND ADVANCED .7 DNX12 (GAUZE/BANDAGES/DRESSINGS) ×1 IMPLANT
DRAPE FOOT SWITCH (DRAPES) ×2 IMPLANT
DRAPE STERI IOBAN 125X83 (DRAPES) ×2 IMPLANT
DRAPE U-SHAPE 47X51 STRL (DRAPES) ×4 IMPLANT
DRESSING AQUACEL AG SP 3.5X10 (GAUZE/BANDAGES/DRESSINGS) ×1 IMPLANT
DRSG AQUACEL AG SP 3.5X10 (GAUZE/BANDAGES/DRESSINGS) ×2
DURAPREP 26ML APPLICATOR (WOUND CARE) ×2 IMPLANT
ELECT REM PT RETURN 15FT ADLT (MISCELLANEOUS) ×2 IMPLANT
ELIMINATOR HOLE APEX DEPUY (Hips) ×2 IMPLANT
GLOVE SURG ENC MOIS LTX SZ6 (GLOVE) ×4 IMPLANT
GLOVE SURG UNDER LTX SZ7.5 (GLOVE) ×2 IMPLANT
GLOVE SURG UNDER POLY LF SZ6.5 (GLOVE) ×2 IMPLANT
GLOVE SURG UNDER POLY LF SZ7.5 (GLOVE) ×4 IMPLANT
GOWN STRL REUS W/TWL LRG LVL3 (GOWN DISPOSABLE) ×4 IMPLANT
HEAD ARTICULEZE (Hips) ×1 IMPLANT
HOLDER FOLEY CATH W/STRAP (MISCELLANEOUS) ×2 IMPLANT
KIT TURNOVER KIT A (KITS) ×2 IMPLANT
LINER NEUTRAL 52X36MM PLUS 4 (Liner) ×2 IMPLANT
PACK ANTERIOR HIP CUSTOM (KITS) ×2 IMPLANT
PENCIL SMOKE EVACUATOR (MISCELLANEOUS) IMPLANT
SCREW 6.5MMX30MM (Screw) ×2 IMPLANT
STEM FEMORAL SZ6 HIGH ACTIS (Stem) ×2 IMPLANT
SUT MNCRL AB 4-0 PS2 18 (SUTURE) ×2 IMPLANT
SUT STRATAFIX 0 PDS 27 VIOLET (SUTURE) ×2
SUT VIC AB 1 CT1 36 (SUTURE) ×6 IMPLANT
SUT VIC AB 2-0 CT1 27 (SUTURE) ×4
SUT VIC AB 2-0 CT1 TAPERPNT 27 (SUTURE) ×2 IMPLANT
SUTURE STRATFX 0 PDS 27 VIOLET (SUTURE) ×1 IMPLANT
TRAY FOLEY MTR SLVR 16FR STAT (SET/KITS/TRAYS/PACK) IMPLANT
TUBE SUCTION HIGH CAP CLEAR NV (SUCTIONS) ×2 IMPLANT
WATER STERILE IRR 1000ML POUR (IV SOLUTION) ×2 IMPLANT

## 2020-08-02 NOTE — Anesthesia Preprocedure Evaluation (Signed)
Anesthesia Evaluation  Patient identified by MRN, date of birth, ID band Patient awake    Reviewed: Allergy & Precautions, NPO status , Patient's Chart, lab work & pertinent test results  Airway Mallampati: II  TM Distance: >3 FB Neck ROM: Full    Dental no notable dental hx.    Pulmonary neg pulmonary ROS,    Pulmonary exam normal breath sounds clear to auscultation       Cardiovascular hypertension, + dysrhythmias Atrial Fibrillation  Rhythm:Irregular Rate:Normal + Systolic murmurs S/P MVR, AVR, and tricuspid valve repair   Neuro/Psych CVA negative psych ROS   GI/Hepatic negative GI ROS, Neg liver ROS,   Endo/Other  diabetes  Renal/GU negative Renal ROS  negative genitourinary   Musculoskeletal negative musculoskeletal ROS (+)   Abdominal   Peds negative pediatric ROS (+)  Hematology  (+) anemia , anticoagulated   Anesthesia Other Findings   Reproductive/Obstetrics negative OB ROS                             Anesthesia Physical Anesthesia Plan  ASA: 4  Anesthesia Plan: General   Post-op Pain Management:    Induction: Intravenous  PONV Risk Score and Plan: 2 and Ondansetron, Dexamethasone and Treatment may vary due to age or medical condition  Airway Management Planned: Oral ETT  Additional Equipment:   Intra-op Plan:   Post-operative Plan: Extubation in OR  Informed Consent: I have reviewed the patients History and Physical, chart, labs and discussed the procedure including the risks, benefits and alternatives for the proposed anesthesia with the patient or authorized representative who has indicated his/her understanding and acceptance.     Dental advisory given  Plan Discussed with: CRNA and Surgeon  Anesthesia Plan Comments:         Anesthesia Quick Evaluation

## 2020-08-02 NOTE — Op Note (Addendum)
NAME:  [name].                ACCOUNT NO.: [id number]      MEDICAL RECORD NO.: [id number]      FACILITY:  [facility]      PHYSICIAN:  [name]  DATE OF BIRTH:  [birth year]     DATE OF PROCEDURE:  08/02/2020                                 OPERATIVE REPORT         PREOPERATIVE DIAGNOSIS: Left  hip femoral neck fracture.      POSTOPERATIVE DIAGNOSIS:  Left hip femoral neck fracture.      PROCEDURE:  Left total hip replacement through an anterior approach   utilizing DePuy THR system, component size 52 mm pinnacle cup, a size 36+4 neutral   Altrex liner, a size 6 Hi Actis stem with a 36+5 Articuleze metal head ball.      SURGEON:  Pietro Cassis. Alvan Dame, M.D.      ASSISTANT:  Costella Hatcher, PA-C     ANESTHESIA:  General.      SPECIMENS:  None.      COMPLICATIONS:  None.      BLOOD LOSS:  450 cc     DRAINS:  None.      INDICATION OF THE PROCEDURE:  Gerald Grudzinski. is a 85 y.o. male who presented to the ER after an unfortunate ground level fall.  He had immediate onset of pain with the inability to bear weight.  Radiographs in the ER revealed a left femoral neck fracture.  We reviewed the nature of the injury and the treatment recommendations of arthoplasty to address the fracture and help with long term functionality.  Consent was obtained for   benefit of pain relief and management of his hip fracture.  Specific risks of infection, DVT, component   failure, dislocation, neurovascular injury, and need for revision surgery were reviewed.  Additionally, we obtained consent to inject his left knee with cortisone due to a diagnosis of knee osteoarthritis.     PROCEDURE IN DETAIL:  The patient was brought to operative theater.   Once adequate anesthesia, preoperative antibiotics, 2 gm of Ancef, 1 gm of Tranexamic Acid, and 10 mg of Decadron were administered, the patient was positioned supine on the [employer] table.  Once the patient was safely  positioned with adequate padding of boney prominences we predraped out the hip, and used fluoroscopy to confirm orientation of the pelvis.      The left hip was then prepped and draped from proximal iliac crest to   mid thigh with a shower curtain technique.      Time-out was performed identifying the patient, planned procedure, and the appropriate extremity.     An incision was then made 2 cm lateral to the   anterior superior iliac spine extending over the orientation of the   tensor fascia lata muscle and sharp dissection was carried down to the   fascia of the muscle.      The fascia was then incised.  The muscle belly was identified and swept   laterally and retractor placed along the superior neck.  Following   cauterization of the circumflex vessels and removing some pericapsular   fat, a second cobra retractor was placed on the inferior neck.  A T-capsulotomy was  made along the line of the   superior neck to the trochanteric fossa, then extended proximally and   distally.  Tag sutures were placed and the retractors were then placed   intracapsular.  We then identified the trochanteric fossa and   orientation of my neck cut and then made a neck osteotomy with the femur on traction.  The femoral   head was removed without difficulty or complication.  Traction was let   off and retractors were placed posterior and anterior around the   acetabulum.      The labrum and foveal tissue were debrided.  I began reaming with a 46 mm   reamer and reamed up to 51 mm reamer with good bony bed preparation and a 52 mm  cup was chosen.  The final 52 mm Pinnacle cup was then impacted under fluoroscopy to confirm the depth of penetration and orientation with respect to   Abduction and forward flexion.  A screw was placed into the ilium followed by the hole eliminator.  The final   36+4 neutral Altrex liner was impacted with good visualized rim fit.  The cup was positioned anatomically within the  acetabular portion of the pelvis.      At this point, the femur was rolled to 100 degrees.  Further capsule was   released off the inferior aspect of the femoral neck.  I then   released the superior capsule proximally.  With the leg in a neutral position the hook was placed laterally   along the femur under the vastus lateralis origin and elevated manually and then held in position using the hook attachment on the bed.  The leg was then extended and adducted with the leg rolled to 100   degrees of external rotation.  Retractors were placed along the medial calcar and posteriorly over the greater trochanter.  Once the proximal femur was fully   exposed, I used a box osteotome to set orientation.  I then began   broaching with the starting chili pepper broach and passed this by hand and then broached up to 6.  With the 6 broach in place I chose a high offset neck and did several trial reductions.  The offset was appropriate, leg lengths   appeared to be equal best matched with the +5 head ball trial confirmed radiographically.   Given these findings, I went ahead and dislocated the hip, repositioned all   retractors and positioned the right hip in the extended and abducted position.  The final 6 Hi Actis stem was   chosen and it was impacted down to the level of neck cut.  Based on this   and the trial reductions, a final 36+5 Articuleze metal head ball was chosen and   impacted onto a clean and dry trunnion, and the hip was reduced.  The   hip had been irrigated throughout the case again at this point.  I did   reapproximate the superior capsular leaflet to the anterior leaflet   using #1 Vicryl.  The fascia of the   tensor fascia lata muscle was then reapproximated using #1 Vicryl and #0 Stratafix sutures.  The   remaining wound was closed with 2-0 Vicryl and running 4-0 Monocryl.   The hip was cleaned, dried, and dressed sterilely using Dermabond and   Aquacel dressing.  The patient was then  brought   to recovery room in stable condition tolerating the procedure well.   At the end of the hip  arthroplasty portion of the case and once the drapes were removed we prepped the lateral proximal knee with betadine and then injected his left knee with 80 mg of Depomedrol and lidocaine.  Site dressed with a Band-Aid.   Costella Hatcher, PA-C was present for the entirety of the case involved from   preoperative positioning, perioperative retractor management, general   facilitation of the case, as well as primary wound closure as assistant.            Pietro Cassis Alvan Dame, M.D.        08/02/2020 4:28 PM

## 2020-08-02 NOTE — Progress Notes (Signed)
PROGRESS NOTE    [name].  IR:[id number] DOB: 1935/03/21 DOA: 08/01/2020 PCP: Marda Stalker, PA-C   Brief Narrative:  HPI: [name]. is a 85 y.o. male with medical history significant for coronary artery disease, atrial fibrillation on warfarin, history of cardiac valve replacement/repair (aortic, mitral, and tricuspid), hypertension, gout, BPH, urinary retention, who presents to the emergency department on 08/01/2020 with left hip pain and inability to ambulate after a fall at home. Onset of patient's symptoms was 08/01/2020 and duration is constant. He was sitting playing bridge and his left foot fell asleep; when he stood up his foot/leg did not support him and he fell down onto his left hip. Pain is located the left hip and sometimes radiates to rest of left leg. He does not have any pain in his left ankle. He did not his his head or neck when he fell. It is up to 8/10 at times and is characterized as achy. Symptoms are alleviated by nothing and exacerbated by moving his leg. Associated symptoms: No headache, slurred speech, or focal weakness. No numbness except his chronic neuropathy. No dysuria or hematuria. No fever or chills.     ED Course: X-ray revealed a mildly displaced fracture of the left femoral neck.  Labs included WBCs 13.  Assessment & Plan:   Principal Problem:   Closed left hip fracture, initial encounter [facility]) Active Problems:   Essential hypertension   Gout   A-fib (HCC)   S/P AVR (aortic valve replacement)   Benign prostatic hyperplasia with lower urinary tract symptoms   Long term current use of anticoagulant   Peripheral neuropathy  Closed left hip fracture: Per orthopedics.  Plan of surgery today.  Permanent atrial fibrillation/long-term use of anticoagulant: Controlled.  Continue home dose of atenolol and hold warfarin for planned surgery today. Plan: Continue home atenolol.  Holding warfarin due to planned surgery.   Peripheral  neuropathy: Continue home dose of gabapentin.  Essential hypertension: Blood pressure fairly stable.  Continue home regimen which includes Proscar, amlodipine, lisinopril.  Status post multiple heart valve replaced, aortic, mitral and tricuspid: Will need to maintain anticoagulation right after the surgery once cleared by orthopedics.  Currently stable and asymptomatic.  BPH: Continue finasteride and Flomax.  Of gout: Continue allopurinol.  DVT prophylaxis: SCDs Start: 08/02/20 0601 SCDs Start: 08/02/20 0601 Place and maintain sequential compression device Start: 08/01/20 2013   Code Status: Full Code  Family Communication: None present at bedside.  Plan of care discussed with patient in length and he verbalized understanding and agreed with it.  Status is: Inpatient  Remains inpatient appropriate because:Inpatient level of care appropriate due to severity of illness  Dispo: The patient is from: Home              Anticipated d/c is to: SNF              Patient currently is not medically stable to d/c.   Difficult to place patient No        Estimated body mass index is 24.16 kg/m as calculated from the following:   Height as of this encounter: 6' (1.829 m).   Weight as of this encounter: 80.8 kg.      Nutritional status:               Consultants:  Orthopedics  Procedures:  None  Antimicrobials:  Anti-infectives (From admission, onward)    None          Subjective:  Seen and examined.  Pain well controlled.  No other complaint.  Objective: Vitals:   08/01/20 2223 08/01/20 2233 08/02/20 0253 08/02/20 0529  BP: 140/83  (!) 150/81 (!) 157/80  Pulse: 81  79 78  Resp: '17  17 16  '$ Temp: 98.8 F (37.1 C)  98.8 F (37.1 C) 99.3 F (37.4 C)  TempSrc: Oral  Oral Oral  SpO2: 96%  96% 95%  Weight:  80.8 kg    Height:  6' (1.829 m)      Intake/Output Summary (Last 24 hours) at 08/02/2020 0736 Last data filed at 08/02/2020 0657 Gross per 24 hour   Intake --  Output 1025 ml  Net -1025 ml   Filed Weights   08/01/20 1730 08/01/20 2233  Weight: 79.4 kg 80.8 kg    Examination:  General exam: Appears calm and comfortable  Respiratory system: Clear to auscultation. Respiratory effort normal. Cardiovascular system: S1 & S2 heard, RRR. No JVD, murmurs, rubs, gallops or clicks. No pedal edema. Gastrointestinal system: Abdomen is nondistended, soft and nontender. No organomegaly or masses felt. Normal bowel sounds heard. Central nervous system: Alert and oriented. No focal neurological deficits. Extremities: Left lower extremity shortened and internally rotated Skin: No rashes, lesions or ulcers Psychiatry: Judgement and insight appear normal. Mood & affect appropriate.    Data Reviewed: I have personally reviewed following labs and imaging studies  CBC: Recent Labs  Lab 08/01/20 1809 08/02/20 0632  WBC 13.0* 9.9  NEUTROABS 11.0*  --   HGB 13.1 12.1*  HCT 38.3* 35.5*  MCV 96.5 96.7  PLT 155 [id number]*   Basic Metabolic Panel: Recent Labs  Lab 08/01/20 1809 08/02/20 0632  NA 138 140  K 3.9 3.6  CL 106 108  CO2 21* 24  GLUCOSE 140* 108*  BUN 19 15  CREATININE 0.86 0.81  CALCIUM 9.3 9.1   GFR: Estimated Creatinine Clearance: 75.8 mL/min (by C-G formula based on SCr of 0.81 mg/dL). Liver Function Tests: Recent Labs  Lab 08/01/20 1809  AST 23  ALT 17  ALKPHOS 56  BILITOT 0.8  PROT 7.8  ALBUMIN 4.5   No results for input(s): LIPASE, AMYLASE in the last 168 hours. No results for input(s): AMMONIA in the last 168 hours. Coagulation Profile: Recent Labs  Lab 08/01/20 1940  INR 2.2*   Cardiac Enzymes: No results for input(s): CKTOTAL, CKMB, CKMBINDEX, TROPONINI in the last 168 hours. BNP (last 3 results) No results for input(s): PROBNP in the last 8760 hours. HbA1C: No results for input(s): HGBA1C in the last 72 hours. CBG: No results for input(s): GLUCAP in the last 168 hours. Lipid Profile: No results  for input(s): CHOL, HDL, LDLCALC, TRIG, CHOLHDL, LDLDIRECT in the last 72 hours. Thyroid Function Tests: No results for input(s): TSH, T4TOTAL, FREET4, T3FREE, THYROIDAB in the last 72 hours. Anemia Panel: No results for input(s): VITAMINB12, FOLATE, FERRITIN, TIBC, IRON, RETICCTPCT in the last 72 hours. Sepsis Labs: No results for input(s): PROCALCITON, LATICACIDVEN in the last 168 hours.  Recent Results (from the past 240 hour(s))  Resp Panel by RT-PCR (Flu A&B, Covid) Nasopharyngeal Swab     Status: None   Collection Time: 08/01/20  6:42 PM   Specimen: Nasopharyngeal Swab; Nasopharyngeal(NP) swabs in vial transport medium  Result Value Ref Range Status   SARS Coronavirus 2 by RT PCR NEGATIVE NEGATIVE Final    Comment: (NOTE) SARS-CoV-2 target nucleic acids are NOT DETECTED.  The SARS-CoV-2 RNA is generally detectable in upper respiratory specimens during the acute  phase of infection. The lowest concentration of SARS-CoV-2 viral copies this assay can detect is 138 copies/mL. A negative result does not preclude SARS-Cov-2 infection and should not be used as the sole basis for treatment or other patient management decisions. A negative result may occur with  improper specimen collection/handling, submission of specimen other than nasopharyngeal swab, presence of viral mutation(s) within the areas targeted by this assay, and inadequate number of viral copies(<138 copies/mL). A negative result must be combined with clinical observations, patient history, and epidemiological information. The expected result is Negative.  Fact Sheet for Patients:  EntrepreneurPulse.com.au  Fact Sheet for Healthcare Providers:  IncredibleEmployment.be  This test is no t yet approved or cleared by the [place] FDA and  has been authorized for detection and/or diagnosis of SARS-CoV-2 by FDA under an Emergency Use Authorization (EUA). This EUA will remain  in  effect (meaning this test can be used) for the duration of the COVID-19 declaration under Section 564(b)(1) of the Act, 21 U.S.C.section 360bbb-3(b)(1), unless the authorization is terminated  or revoked sooner.       Influenza A by PCR NEGATIVE NEGATIVE Final   Influenza B by PCR NEGATIVE NEGATIVE Final    Comment: (NOTE) The Xpert Xpress SARS-CoV-2/FLU/RSV plus assay is intended as an aid in the diagnosis of influenza from Nasopharyngeal swab specimens and should not be used as a sole basis for treatment. Nasal washings and aspirates are unacceptable for Xpert Xpress SARS-CoV-2/FLU/RSV testing.  Fact Sheet for Patients: EntrepreneurPulse.com.au  Fact Sheet for Healthcare Providers: IncredibleEmployment.be  This test is not yet approved or cleared by the [place] FDA and has been authorized for detection and/or diagnosis of SARS-CoV-2 by FDA under an Emergency Use Authorization (EUA). This EUA will remain in effect (meaning this test can be used) for the duration of the COVID-19 declaration under Section 564(b)(1) of the Act, 21 U.S.C. section 360bbb-3(b)(1), unless the authorization is terminated or revoked.  Performed at [facility], [place] [address] [place], [place] [address]       Radiology Studies: DG Hip Unilat W or Wo Pelvis 2-3 Views Left  Result Date: 08/01/2020 CLINICAL DATA:  85 year old male with fall and left hip pain. EXAM: DG HIP (WITH OR WITHOUT PELVIS) 2-3V LEFT COMPARISON:  None. FINDINGS: There is a mildly displaced fracture of the left femoral neck involving the basicervical region. The bones are osteopenic. No dislocation. Mild bilateral hip arthritic changes. There is degenerative changes of the lower lumbar spine. The soft tissues are unremarkable. Multiple surgical clips noted over the right hip. IMPRESSION: Mildly displaced fracture of the left femoral neck. No dislocation. Electronically  Signed   By: Anner Crete M.D.   On: 08/01/2020 19:24    Scheduled Meds:  allopurinol  150 mg Oral q morning   amLODipine  10 mg Oral Daily   atenolol  12.5 mg Oral q morning   B-complex with vitamin C  1 tablet Oral q morning   cholecalciferol  1,000 Units Oral q morning   ferrous sulfate  325 mg Oral q morning   finasteride  5 mg Oral Daily   gabapentin  100 mg Oral Q lunch   lisinopril  10 mg Oral Daily   tamsulosin  0.8 mg Oral Q lunch   Continuous Infusions:  phytonadione (VITAMIN K) IV       LOS: 1 day   Time spent: 35 minutes   Darliss Cheney, MD [facility]  08/02/2020, 7:36 AM   How  to contact the [facility] Attending or Consulting provider [place] or covering provider during after hours [place], for this patient?  Check the care team in [facility] and look for a) attending/consulting [facility] provider listed and b) the [facility] team listed. Page or secure chat [phone]. Log into [email or web address] and use [place]'s universal password to access. If you do not have the password, please contact the hospital operator. Locate the [facility] provider you are looking for under [facility] and page to a number that you can be directly reached. If you still have difficulty reaching the provider, please page the [facility] (Director on Call) for the Hospitalists listed on amion for assistance.

## 2020-08-02 NOTE — Anesthesia Procedure Notes (Signed)
Procedure Name: Intubation Date/Time: 08/02/2020 5:43 PM Performed by: Gerald Leitz, CRNA Pre-anesthesia Checklist: Patient identified, Patient being monitored, Timeout performed, Emergency Drugs available and Suction available Patient Re-evaluated:Patient Re-evaluated prior to induction Oxygen Delivery Method: Circle system utilized Preoxygenation: Pre-oxygenation with 100% oxygen Induction Type: IV induction Ventilation: Mask ventilation without difficulty Laryngoscope Size: Mac and 3 Grade View: Grade I Tube type: Oral Tube size: 7.5 mm Number of attempts: 1 Placement Confirmation: ETT inserted through vocal cords under direct vision, positive ETCO2 and breath sounds checked- equal and bilateral Secured at: 23 cm Tube secured with: Tape Dental Injury: Teeth and Oropharynx as per pre-operative assessment

## 2020-08-02 NOTE — Transfer of Care (Signed)
Immediate Anesthesia Transfer of Care Note  Patient: [name].  Procedure(s) Performed: TOTAL HIP ARTHROPLASTY ANTERIOR APPROACH (Left: Hip)  Patient Location: PACU  Anesthesia Type:General  Level of Consciousness: drowsy and patient cooperative  Airway & Oxygen Therapy: Patient Spontanous Breathing and Patient connected to face mask oxygen  Post-op Assessment: Report given to RN and Post -op Vital signs reviewed and stable  Post vital signs: Reviewed and stable  Last Vitals:  Vitals Value Taken Time  BP 108/66 08/02/20 1923  Temp 36.7 C 08/02/20 1922  Pulse 57 08/02/20 1927  Resp 15 08/02/20 1927  SpO2 95 % 08/02/20 1927  Vitals shown include unvalidated device data.  Last Pain:  Vitals:   08/02/20 1500  TempSrc:   PainSc: 0-No pain      Patients Stated Pain Goal: 2 ([id number])  Complications: No notable events documented.

## 2020-08-02 NOTE — Discharge Instructions (Signed)

## 2020-08-02 NOTE — Consult Note (Addendum)
Reason for Consult: left hip fracture Referring Physician: Doristine Bosworth, MD  Gerald Amas. is an 85 y.o. male.  HPI: [name]. is a 85 y.o. male with medical history significant for coronary artery disease, atrial fibrillation on warfarin, history of cardiac valve replacement/repair (aortic, mitral, and tricuspid), hypertension, gout, BPH, urinary retention, who presents to the emergency department on 08/01/2020 with left hip pain and inability to ambulate after a fall at home. Onset of patient's symptoms was 08/01/2020 and duration is constant. He was sitting playing bridge and his left foot fell asleep; when he stood up his foot/leg did not support him and he fell down onto his left hip. Pain is located the left hip and sometimes radiates to rest of left leg. He does not have any pain in his left ankle. He did not his his head or neck when he fell. It is up to 8/10 at times and is characterized as achy. Symptoms are alleviated by nothing and exacerbated by moving his leg. Associated symptoms: No headache, slurred speech, or focal weakness. No numbness except his chronic neuropathy. No dysuria or hematuria. No fever or chills.  Past Medical History:  Diagnosis Date   Acquired dilation of ascending aorta and aortic root ([place])    49m aortic root and 417mascending aorta on echo 03/2020   Bladder stones    Borderline diabetes    BPH (benign prostatic hyperplasia)    Coronary artery disease    cardiologist-  dr skNat Matherhart NP--- per cath 06-02-2010 non-obstructive cad pLAD 30-40%   Diverticulosis of colon    Dysrhythmia    afib   Gout    Heart murmur    History of adenomatous polyp of colon    2002-- tubular adenoma   History of aortic insufficiency    severe -- s/p  AVR 08-03-2010   History of small bowel obstruction    02/ 2007 mechanical sbo s/p  surgical intervention;  partial sbo 09/ 2011 and 03-20-2011 resolved without surgical intervention   History of urinary  retention    HTN (hypertension)    Peripheral neuropathy    Persistent atrial fibrillation (HCC)    S/P aortic valve replacement with prosthetic valve 08/03/2010   tissue valve   S/P Maze operation for atrial fibrillation 01/30/2018   Complete bilateral atrial lesion set using bipolar radiofrequency and cryothermy with clipping of LA appendage   S/P MVR (mitral valve repair) 01/30/2018   Complex valvuloplasty including artificial Gore-tex neochord placement x4 and Carbo medics Annuloflex ring annuloplasty, size 28   S/P patent foramen ovale closure 08/03/2010   at same time AVR   S/P tricuspid valve repair 01/30/2018   Using an MC3 Annuloplasty ring, size 28   [place]   Tricuspid regurgitation     Past Surgical History:  Procedure Laterality Date   BIOPSY  09/07/2018   Procedure: BIOPSY;  Surgeon: BrOtis BraceMD;  Location: [facility];  Service: Gastroenterology;;   CARDIAC CATHETERIZATION  06-02-2010  dr skMarlou Porch non-obstructive cad- pLAD 30-40%/  normal LVSF/  severe AI   CARDIOVASCULAR STRESS TEST  04/12/2016   Low risk nuclear perfusion study w/ no significant reversible ischemia/  normal LV function and wall motion ,  stress ef 60%/  [id number] and lateral scooped ST-segment depression w/ exercise (may be repolarization abnormality), exercise capacity was moderately reduced   CATARACT EXTRACTION W/ INTRAOCULAR LENS  IMPLANT, BILATERAL  02/2010   CLIPPING OF ATRIAL APPENDAGE  N/A 01/30/2018   Procedure: CLIPPING OF LEFT ATRIAL APPENDAGE USING ATRICLIP PRO2 45MM;  Surgeon: Rexene Alberts, MD;  Location: [place];  Service: Open Heart Surgery;  Laterality: N/A;   COLONOSCOPY WITH PROPOFOL N/A 10/21/2018   Procedure: COLONOSCOPY WITH PROPOFOL;  Surgeon: Otis Brace, MD;  [facility]: [facility];  Service: Gastroenterology;  Laterality: N/A;   CYSTOSCOPY WITH LITHOLAPAXY N/A 06/05/2016   Procedure: CYSTOSCOPY WITH LITHOLAPAXY and fulgarization of bladder neck;  Surgeon:  Irine Seal, MD;  [facility];  Service: Urology;  Laterality: N/A;   ESOPHAGOGASTRODUODENOSCOPY (EGD) WITH PROPOFOL N/A 09/07/2018   Procedure: ESOPHAGOGASTRODUODENOSCOPY (EGD) WITH PROPOFOL;  Surgeon: Otis Brace, MD;  [facility]: [facility];  Service: Gastroenterology;  Laterality: N/A;   EXPLORATORY LAPARTOMY /  CHOLECYSTECTOMY  02/28/2005   for Small  bowel obstruction (mechnical)   IR ANGIO EXTRACRAN SEL COM CAROTID INNOMINATE UNI L MOD SED  10/24/2016   IR ANGIO VERTEBRAL SEL SUBCLAVIAN INNOMINATE BILAT MOD SED  10/24/2016   IR PERCUTANEOUS ART THROMBECTOMY/INFUSION INTRACRANIAL INC DIAG ANGIO  10/24/2016   IR RADIOLOGIST EVAL & MGMT  12/05/2016   LEFT KNEE ARTHROSCOPY  2006   MAZE N/A 01/30/2018   Procedure: MAZE;  Surgeon: Rexene Alberts, MD;  Location: [place];  Service: Open Heart Surgery;  Laterality: N/A;   MITRAL VALVE REPAIR N/A 01/30/2018   Procedure: MITRAL VALVE REPAIR (MVR) USING CARBOMEDICS ANNULOFLEX SIZE 28;  Surgeon: Rexene Alberts, MD;  Location: [place];  Service: Open Heart Surgery;  Laterality: N/A;   POLYPECTOMY  10/21/2018   Procedure: POLYPECTOMY;  Surgeon: Otis Brace, MD;  [facility];  Service: Gastroenterology;;   RADIOLOGY WITH ANESTHESIA N/A 10/24/2016   Procedure: RADIOLOGY WITH ANESTHESIA;  Surgeon: Luanne Bras, MD;  Location: [place];  Service: Radiology;  Laterality: N/A;   RIGHT FOOT SURGERY     RIGHT MINIATURE ANTERIOR THORACOTOMY FOR AORTIC VALVE REPLACEMENT AND CLOSURE PATENT FORAMEN OVALE  08-03-2010  DR Levada Schilling Magna-ease pericardial tissue valve (9m)   RIGHT/LEFT HEART CATH AND CORONARY ANGIOGRAPHY N/A 10/14/2017   Procedure: RIGHT/LEFT HEART CATH AND CORONARY ANGIOGRAPHY;  Surgeon: CSherren Mocha MD;  [place] [facility];  Service: Cardiovascular;  Laterality: N/A;   TEE WITHOUT CARDIOVERSION N/A 10/14/2017   Procedure: TRANSESOPHAGEAL ECHOCARDIOGRAM (TEE);  Surgeon: NJosue Hector MD;  [facility];  Service: Cardiovascular;  Laterality: N/A;   TRANSTHORACIC ECHOCARDIOGRAM  05/30/2016  dr skains   moderate  LVH ef 60-65%/  bioprothesis aortic valve present ,normal grandient and no AI /  mild MV calcification , moderate MR /  mild PR/ moderate TR/  PASP 317mg/ (RA denisty was identified 04-27-2016 echo) and is seen again today, this is likely a promient eustacian ridge, atrium is normal size   TRICUSPID VALVE REPLACEMENT N/A 01/30/2018   Procedure: TRICUSPID VALVE REPAIR USING MC3 SIZE 28;  Surgeon: OwRexene AlbertsMD;  [place] Service: Open [place] Surgery;  Laterality: N/A;    Family History  Problem Relation Age of Onset   Heart disease Mother    Brain cancer Father     Social History:  reports that he has never smoked. He has never used smokeless tobacco. He reports current alcohol use. He reports that he does not use drugs.  Allergies:  Allergies  Allergen Reactions   Sulfa Antibiotics Other (See Comments)    Granulocytosis    Medications: I have reviewed the patient's current medications. Scheduled:  allopurinol  150 mg Oral q morning   amLODipine  10 mg Oral Daily   atenolol  12.5 mg Oral q morning   B-complex with vitamin C  1 tablet Oral q morning   cholecalciferol  1,000 Units Oral q morning   ferrous sulfate  325 mg Oral q morning   finasteride  5 mg Oral Daily   gabapentin  100 mg Oral Q lunch   lisinopril  10 mg Oral Daily   tamsulosin  0.8 mg Oral Q lunch    Results for orders placed or performed during the hospital encounter of 08/01/20 (from the past 24 hour(s))  CBC with Differential/Platelet     Status: Abnormal   Collection Time: 08/01/20  6:09 PM  Result Value Ref Range   WBC 13.0 (H) 4.0 - 10.5 K/uL   RBC 3.97 (L) 4.22 - 5.81 MIL/uL   Hemoglobin 13.1 13.0 - 17.0 g/dL   HCT 38.3 (L) 39.0 - 52.0 %   MCV 96.5 80.0 - 100.0 fL   MCH 33.0 26.0 - 34.0 pg   MCHC 34.2 30.0 - 36.0 g/dL   RDW 13.3 11.5 - 15.5  %   Platelets 155 150 - 400 K/uL   nRBC 0.0 0.0 - 0.2 %   Neutrophils Relative % 85 %   Neutro Abs 11.0 (H) 1.7 - 7.7 K/uL   Lymphocytes Relative 9 %   Lymphs Abs 1.2 0.7 - 4.0 K/uL   Monocytes Relative 6 %   Monocytes Absolute 0.8 0.1 - 1.0 K/uL   Eosinophils Relative 0 %   Eosinophils Absolute 0.0 0.0 - 0.5 K/uL   Basophils Relative 0 %   Basophils Absolute 0.0 0.0 - 0.1 K/uL   Immature Granulocytes 0 %   Abs Immature Granulocytes 0.04 0.00 - 0.07 K/uL  Comprehensive metabolic panel     Status: Abnormal   Collection Time: 08/01/20  6:09 PM  Result Value Ref Range   Sodium 138 135 - 145 mmol/L   Potassium 3.9 3.5 - 5.1 mmol/L   Chloride 106 98 - 111 mmol/L   CO2 21 (L) 22 - 32 mmol/L   Glucose, Bld 140 (H) 70 - 99 mg/dL   BUN 19 8 - 23 mg/dL   Creatinine, Ser 0.86 0.61 - 1.24 mg/dL   Calcium 9.3 8.9 - 10.3 mg/dL   Total Protein 7.8 6.5 - 8.1 g/dL   Albumin 4.5 3.5 - 5.0 g/dL   AST 23 15 - 41 U/L   ALT 17 0 - 44 U/L   Alkaline Phosphatase 56 38 - 126 U/L   Total Bilirubin 0.8 0.3 - 1.2 mg/dL   GFR, Estimated >60 >60 mL/min   Anion gap 11 5 - 15  Resp Panel by RT-PCR (Flu A&B, Covid) Nasopharyngeal Swab     Status: None   Collection Time: 08/01/20  6:42 PM   Specimen: Nasopharyngeal Swab; Nasopharyngeal(NP) swabs in vial transport medium  Result Value Ref Range   SARS Coronavirus 2 by RT PCR NEGATIVE NEGATIVE   Influenza A by PCR NEGATIVE NEGATIVE   Influenza B by PCR NEGATIVE NEGATIVE  Protime-INR     Status: Abnormal   Collection Time: 08/01/20  7:40 PM  Result Value Ref Range   Prothrombin Time 24.6 (H) 11.4 - 15.2 seconds   INR 2.2 (H) 0.8 - 1.2  Basic metabolic panel     Status: Abnormal   Collection Time: 08/02/20  6:32 AM  Result Value Ref Range   Sodium 140 135 - 145 mmol/L  Potassium 3.6 3.5 - 5.1 mmol/L   Chloride 108 98 - 111 mmol/L   CO2 24 22 - 32 mmol/L   Glucose, Bld 108 (H) 70 - 99 mg/dL   BUN 15 8 - 23 mg/dL   Creatinine, Ser 0.81 0.61 - 1.24  mg/dL   Calcium 9.1 8.9 - 10.3 mg/dL   GFR, Estimated >60 >60 mL/min   Anion gap 8 5 - 15  CBC     Status: Abnormal   Collection Time: 08/02/20  6:32 AM  Result Value Ref Range   WBC 9.9 4.0 - 10.5 K/uL   RBC 3.67 (L) 4.22 - 5.81 MIL/uL   Hemoglobin 12.1 (L) 13.0 - 17.0 g/dL   HCT 35.5 (L) 39.0 - 52.0 %   MCV 96.7 80.0 - 100.0 fL   MCH 33.0 26.0 - 34.0 pg   MCHC 34.1 30.0 - 36.0 g/dL   RDW 13.4 11.5 - 15.5 %   Platelets 139 (L) 150 - 400 K/uL   nRBC 0.0 0.0 - 0.2 %    X-ray: CLINICAL DATA:  85 year old male with fall and left hip pain.   EXAM: DG HIP (WITH OR WITHOUT PELVIS) 2-3V LEFT   COMPARISON:  None.   FINDINGS: There is a mildly displaced fracture of the left femoral neck involving the basicervical region. The bones are osteopenic. No dislocation. Mild bilateral hip arthritic changes. There is degenerative changes of the lower lumbar spine. The soft tissues are unremarkable. Multiple surgical clips noted over the right hip.   IMPRESSION: Mildly displaced fracture of the left femoral neck. No dislocation.     Electronically Signed   By: Anner Crete M.D.  ROS: As per HPI otherwise all other systems reviewed and are unremarable.  Blood pressure (!) 157/80, pulse 78, temperature 99.3 F (37.4 C), temperature source Oral, resp. rate 16, height 6' (1.829 m), weight 80.8 kg, SpO2 95 %.  Physical Exam: Eyes: Pupils equal and round, lids and conjunctivae without icterus or erythema. ENMT: Mucous membranes are dry. Posterior pharynx clear of any exudate or lesions. Nares patent without discharge or bleeding.  Normocephalic, atraumatic.  Normal dentition. Neck: normal, supple, no masses, trachea midline.  Thyroid nontender, no masses appreciated, no thyromegaly. Respiratory: clear to auscultation bilaterally. Chest wall movements are symmetric. No wheezing, no crackles.  No rhonchi.  Normal respiratory effort. No accessory muscle use. Cardiovascular: Regular rate  and rhythm, no rubs / gallops. Pulses: DP pulses 2+ bilaterally. No carotid bruits.  Capillary refill less than 3 seconds. Edema: None bilaterally. Murmur 2/6 systolic.  GI: soft, non-distended, normal active bowel sounds. No hepatosplenomegaly. No rigidity, rebound, or guarding. Non-tender. No masses palpated. Musculoskeletal: no clubbing / cyanosis. No joint deformity upper and lower extremities. Good ROM, no contractures. Normal muscle tone.  No tenderness or deformity in the back bilaterally. Tenderness in left posterior hip. Slight shortening with painful movement. Left knee without significant effusion warmth or erythema.  ROM not assessed due to left hip fracture Integument: no rashes, lesions, ulcers. No induration. Clean, dry, intact. Neurologic: CN 2-12 grossly intact. Sensation grossly intact to light touch. DTR 2+ bilaterally.  Babinski: Toes downgoing bilaterally.  Strength 5/5 in all 4.  Intact rapid alternating movements bilaterally.  No pronator drift. Psychiatric: Normal judgment and insight. Alert and oriented x 3. Normal mood.  Normal and appropriate affect. Lymphatic: No cervical lymphadenopathy. No supraclavicular lymphadenopathy.  Assessment/Plan: Left hip femoral neck fracture 2.   Left knee osteoarthritis  Plan: I reviewed with him  the fracture pattern and location He will need an arthroplasty to correct problem help with pain and allow for function NPO after 9 am for surgery today if INR less than 2 Consent to be ordered and will include diagnosis of OA left knee for intra-articular injection Post op plans to follow and disposition pending therapy assessment  [name] 08/02/2020, 9:02 AM

## 2020-08-02 NOTE — Progress Notes (Signed)
Initial Nutrition Assessment  DOCUMENTATION CODES:  Non-severe (moderate) malnutrition in context of chronic illness  INTERVENTION:  Advance diet once medically able.  Add Ensure Enlive po BID, each supplement provides 350 kcal and 20 grams of protein.  Add Magic cup TID with meals, each supplement provides 290 kcal and 9 grams of protein.  Continue vitamin supplements daily.  NUTRITION DIAGNOSIS:  Moderate Malnutrition related to chronic illness (CAD) as evidenced by mild fat depletion, mild muscle depletion.  GOAL:  Patient will meet greater than or equal to 90% of their needs  MONITOR:  Diet advancement, PO intake, Supplement acceptance, Labs, Weight trends, Skin, I & O's  REASON FOR ASSESSMENT:  Consult Hip fracture protocol  ASSESSMENT:  85 yo male with a PMH of CAD, A-fib, cardiac valve replacement/repair (aortic, mitral, and tricuspid), HTN, gout, BPH, and urinary retention who presents with a closed L hip fracture.  Spoke with pt and wife at bedside. Pt reports that he has had a good appetite and no changes in PO intake before coming to the hospital.  He denies any significant weight changes. Per Epic, pt's weight has remained stable between 77-81 kg for the past 2 years.  On exam, pt with few depletions, likely due to age.  Recommend adding Ensure Enlive BID and Magic Cup TID for increased nutrition needs for post-operative healing.  Medications: reviewed; B-complex with Vit C, Vitamin D3, ferrous sulfate, NaCl @ 75 ml/hr via IV, morphine PRN (given once today)  Labs: reviewed; Glucose 108 (H)  NUTRITION - FOCUSED PHYSICAL EXAM: Flowsheet Row Most Recent Value  Orbital Region Mild depletion  Upper Arm Region Mild depletion  Thoracic and Lumbar Region No depletion  Buccal Region No depletion  Temple Region No depletion  Clavicle Bone Region Mild depletion  Clavicle and Acromion Bone Region Mild depletion  Scapular Bone Region No depletion  Dorsal Hand No  depletion  Patellar Region No depletion  Anterior Thigh Region No depletion  Posterior Calf Region Mild depletion  Edema (RD Assessment) None  Hair Reviewed  Eyes Reviewed  Mouth Reviewed  Skin Reviewed  Nails Reviewed   Diet Order:   Diet Order             Diet NPO time specified Except for: Sips with Meds  Diet effective now                  EDUCATION NEEDS:  Education needs have been addressed  Skin:  Skin Assessment: Reviewed RN Assessment  Last BM:  08/01/20  Height:  Ht Readings from Last 1 Encounters:  08/01/20 6' (1.829 m)   Weight:  Wt Readings from Last 1 Encounters:  08/01/20 80.8 kg   BMI:  Body mass index is 24.16 kg/m.  Estimated Nutritional Needs:  Kcal:  1900-2100 Protein:  95-110 grams Fluid:  >1.9 L  Derrel Nip, RD, LDN (she/her/hers) Registered Dietitian I After-Hours/Weekend Pager # in [place]

## 2020-08-03 ENCOUNTER — Encounter (HOSPITAL_COMMUNITY): Payer: Self-pay | Admitting: Orthopedic Surgery

## 2020-08-03 DIAGNOSIS — S72002A Fracture of unspecified part of neck of left femur, initial encounter for closed fracture: Secondary | ICD-10-CM | POA: Diagnosis not present

## 2020-08-03 DIAGNOSIS — E440 Moderate protein-calorie malnutrition: Secondary | ICD-10-CM

## 2020-08-03 LAB — BASIC METABOLIC PANEL
Anion gap: 11 (ref 5–15)
BUN: 17 mg/dL (ref 8–23)
CO2: 22 mmol/L (ref 22–32)
Calcium: 8.8 mg/dL — ABNORMAL LOW (ref 8.9–10.3)
Chloride: 107 mmol/L (ref 98–111)
Creatinine, Ser: 0.79 mg/dL (ref 0.61–1.24)
GFR, Estimated: >60 mL/min (ref >60)
Glucose, Bld: 198 mg/dL — ABNORMAL HIGH (ref 70–99)
Potassium: 4.2 mmol/L (ref 3.5–5.1)
Sodium: 140 mmol/L (ref 135–145)

## 2020-08-03 LAB — CBC
HCT: 33 % — ABNORMAL LOW (ref 39.0–52.0)
Hemoglobin: 11 g/dL — ABNORMAL LOW (ref 13.0–17.0)
MCH: 32.7 pg (ref 26.0–34.0)
MCHC: 33.3 g/dL (ref 30.0–36.0)
MCV: 98.2 fL (ref 80.0–100.0)
Platelets: 123 10*3/uL — ABNORMAL LOW (ref 150–400)
RBC: 3.36 MIL/uL — ABNORMAL LOW (ref 4.22–5.81)
RDW: 13.3 % (ref 11.5–15.5)
WBC: 12.8 10*3/uL — ABNORMAL HIGH (ref 4.0–10.5)
nRBC: 0 % (ref 0.0–0.2)

## 2020-08-03 LAB — PROTIME-INR
INR: 1.4 — ABNORMAL HIGH (ref 0.8–1.2)
Prothrombin Time: 17.6 seconds — ABNORMAL HIGH (ref 11.4–15.2)

## 2020-08-03 MED ORDER — MORPHINE SULFATE (PF) 2 MG/ML IV SOLN: 0.5000 mg | INTRAVENOUS | Status: DC | PRN

## 2020-08-03 MED ORDER — METOCLOPRAMIDE HCL 5 MG/ML IJ SOLN: 5.0000 mg | Freq: Three times a day (TID) | INTRAMUSCULAR | Status: DC | PRN

## 2020-08-03 MED ORDER — HYDROCODONE-ACETAMINOPHEN 5-325 MG PO TABS: 1.0000 | ORAL_TABLET | ORAL | Status: DC | PRN

## 2020-08-03 MED ORDER — TRANEXAMIC ACID-NACL 1000-0.7 MG/100ML-% IV SOLN: 1000.0000 mg | Freq: Once | INTRAVENOUS | Status: DC

## 2020-08-03 MED ORDER — ACETAMINOPHEN 325 MG PO TABS
325.0000 mg | ORAL_TABLET | Freq: Four times a day (QID) | ORAL | Status: DC | PRN
Start: 2020-08-03 — End: 2020-08-05
  Administered 2020-08-04: 325 mg via ORAL
  Filled 2020-08-03: qty 2

## 2020-08-03 MED ORDER — ONDANSETRON HCL 4 MG/2ML IJ SOLN: 4.0000 mg | Freq: Four times a day (QID) | INTRAMUSCULAR | Status: DC | PRN

## 2020-08-03 MED ORDER — WARFARIN SODIUM 5 MG PO TABS
7.5000 mg | ORAL_TABLET | Freq: Once | ORAL | Status: AC
  Administered 2020-08-03: 7.5 mg via ORAL
  Filled 2020-08-03: qty 1

## 2020-08-03 MED ORDER — ONDANSETRON HCL 4 MG PO TABS: 4.0000 mg | ORAL_TABLET | Freq: Four times a day (QID) | ORAL | Status: DC | PRN

## 2020-08-03 MED ORDER — HYDROCODONE-ACETAMINOPHEN 7.5-325 MG PO TABS: 1.0000 | ORAL_TABLET | ORAL | Status: DC | PRN

## 2020-08-03 MED ORDER — METOCLOPRAMIDE HCL 5 MG PO TABS: 5.0000 mg | ORAL_TABLET | Freq: Three times a day (TID) | ORAL | Status: DC | PRN

## 2020-08-03 MED ORDER — WARFARIN - PHARMACIST DOSING INPATIENT: Freq: Every day | Status: DC

## 2020-08-03 MED ORDER — CHLORHEXIDINE GLUCONATE CLOTH 2 % EX PADS
6.0000 | MEDICATED_PAD | Freq: Every day | CUTANEOUS | Status: DC
  Administered 2020-08-04 – 2020-08-05 (×2): 6 via TOPICAL

## 2020-08-03 MED ORDER — CEFAZOLIN SODIUM 2 G IJ SOLR
2.0000 g | Freq: Four times a day (QID) | INTRAMUSCULAR | Status: AC
  Administered 2020-08-03: 2 g via INTRAVENOUS
  Filled 2020-08-03: qty 2

## 2020-08-03 MED ORDER — METHOCARBAMOL 500 MG PO TABS
500.0000 mg | ORAL_TABLET | Freq: Four times a day (QID) | ORAL | Status: DC | PRN
  Administered 2020-08-03: 500 mg via ORAL
  Filled 2020-08-03: qty 1

## 2020-08-03 MED ORDER — METHOCARBAMOL 1000 MG/10ML IJ SOLN
500.0000 mg | Freq: Four times a day (QID) | INTRAVENOUS | Status: DC | PRN
  Filled 2020-08-03: qty 5

## 2020-08-03 NOTE — Progress Notes (Signed)
Subjective: 1 Day Post-Op Procedure(s) (LRB): TOTAL HIP ARTHROPLASTY ANTERIOR APPROACH (Left) Patient reports pain as mild.   Patient seen in rounds by Dr. Alvan Dame. Patient is well, and has had no acute complaints or problems other than discomfort in the left hip.  We will start therapy today.   Objective: Vital signs in last 24 hours: Temp:  [97.8 F (36.6 C)-100.4 F (38 C)] 98.1 F (36.7 C) (07/27 0530) Pulse Rate:  [59-75] 63 (07/27 0530) Resp:  [14-23] 16 (07/27 0530) BP: (108-147)/(48-76) 111/69 (07/27 0530) SpO2:  [94 %-100 %] 100 % (07/27 0530) Weight:  [79.4 kg-80.6 kg] 80.6 kg (07/27 0500)  Intake/Output from previous day:  Intake/Output Summary (Last 24 hours) at 08/03/2020 0731 Last data filed at 08/03/2020 0200 Gross per 24 hour  Intake 3149.64 ml  Output 3000 ml  Net 149.64 ml     Intake/Output this shift: No intake/output data recorded.  Labs: Recent Labs    08/01/20 1809 08/02/20 0632 08/03/20 0306  HGB 13.1 12.1* 11.0*   Recent Labs    08/02/20 0632 08/03/20 0306  WBC 9.9 12.8*  RBC 3.67* 3.36*  HCT 35.5* 33.0*  PLT 139* 123*   Recent Labs    08/02/20 0632 08/03/20 0306  NA 140 140  K 3.6 4.2  CL 108 107  CO2 24 22  BUN 15 17  CREATININE 0.81 0.79  GLUCOSE 108* 198*  CALCIUM 9.1 8.8*   Recent Labs    08/02/20 0841 08/03/20 0306  INR 2.4* 1.4*    Exam: General - Patient is Alert and Oriented Extremity - Neurologically intact Sensation intact distally Intact pulses distally Dorsiflexion/Plantar flexion intact Dressing - dressing C/D/I Motor Function - intact, moving foot and toes well on exam.   Past Medical History:  Diagnosis Date   Acquired dilation of ascending aorta and aortic root ([facility])    53m aortic root and 441mascending aorta on echo 03/2020   Bladder stones    Borderline diabetes    BPH (benign prostatic hyperplasia)    Coronary artery disease    cardiologist-  dr skMarlou PorchloCecille Rubinerhart NP--- per cath  06-02-2010 non-obstructive cad pLAD 30-40%   Diverticulosis of colon    Dysrhythmia    afib   Gout    Heart murmur    History of adenomatous polyp of colon    2002-- tubular adenoma   History of aortic insufficiency    severe -- s/p  AVR 08-03-2010   History of small bowel obstruction    02/ 2007 mechanical sbo s/p  surgical intervention;  partial sbo 09/ 2011 and 03-20-2011 resolved without surgical intervention   History of urinary retention    HTN (hypertension)    Peripheral neuropathy    Persistent atrial fibrillation (HCC)    S/P aortic valve replacement with prosthetic valve 08/03/2010   tissue valve   S/P Maze operation for atrial fibrillation 01/30/2018   Complete bilateral atrial lesion set using bipolar radiofrequency and cryothermy with clipping of LA appendage   S/P MVR (mitral valve repair) 01/30/2018   Complex valvuloplasty including artificial Gore-tex neochord placement x4 and Carbo medics Annuloflex ring annuloplasty, size 28   S/P patent foramen ovale closure 08/03/2010   at same time AVR   S/P tricuspid valve repair 01/30/2018   Using an MC3 Annuloplasty ring, size 28   [place]   Tricuspid regurgitation     Assessment/Plan: 1 Day Post-Op Procedure(s) (LRB): TOTAL HIP ARTHROPLASTY ANTERIOR APPROACH (Left) Principal Problem:  Closed left hip fracture, initial encounter [facility]) Active Problems:   Essential hypertension   Gout   A-fib (HCC)   S/P AVR (aortic valve replacement)   Benign prostatic hyperplasia with lower urinary tract symptoms   Long term current use of anticoagulant   Peripheral neuropathy   Malnutrition of moderate degree   S/P left total hip arthroplasty  Estimated body mass index is 24.1 kg/m as calculated from the following:   Height as of this encounter: 6' (1.829 m).   Weight as of this encounter: 80.6 kg. Advance diet Up with therapy  DVT Prophylaxis - Coumadin - INR 1.4, May resume today  Weight bearing as  tolerated.  Plan to get up with PT to work on ambulation and mobility. Discharge disposition pending progress.   [name], PA-C Orthopedic Surgery [phone] 08/03/2020, 7:31 AM

## 2020-08-03 NOTE — Evaluation (Signed)
Physical Therapy Evaluation Patient Details Name: [name]. MRN: [id number] DOB: Nov 19, 1935 [name] Date: 08/03/2020   History of Present Illness  Gerald Roher. is a 85 y.o. male who presents to the emergency department on 08/01/2020 with left hip pain and inability to ambulate after a fall at home. Pt is s/p L THA AA 08/02/20. PMH: CAD, afib, history of cardiac valve replacement/repair (aortic, mitral, and tricuspid), HTN, gout, BPH, urinary retention  Clinical Impression  Pt is s/p L THA AA resulting in the deficits listed below (see PT Problem List). PT independent at baseline, lives with spouse in 2 story home with master on main level, takes walks daily. Eval limited, pt reports not eating since prior to surgery and has been waiting for breakfast tray, requested therapist assist him to recliner so he can eat when food arrives but pt only tolerates STS- RN notified. Pt requires mod A with bed mobility, using BUE and RLE as able to assist. Pt tolerates standing for ~60 sec x2 then requests to return to supine; checked vitals once supine and WNL. Hopeful pt will progress quickly with acute PT, if not, may need short term SNF prior to return home with spouse. Pt will benefit from skilled PT to increase their independence and safety with mobility to allow discharge to the venue listed below.      Follow Up Recommendations SNF (vs HHPT pending progress)    Equipment Recommendations  None recommended by PT    Recommendations for Other Services       Precautions / Restrictions Precautions Precautions: Fall;Anterior Hip Restrictions Weight Bearing Restrictions: No LLE Weight Bearing: Weight bearing as tolerated      Mobility  Bed Mobility Overal bed mobility: Needs Assistance Bed Mobility: Supine to Sit;Sit to Supine  Supine to sit: Mod assist;HOB elevated Sit to supine: Mod assist   General bed mobility comments: mod A to clear LLE to EOB and upright trunk with HOB  elevated, increased time and verbal cues; mod A to lift LLE back into bed and reposition to comfort, pt able to pull on bedrails to assist in repositioning in bed    Transfers Overall transfer level: Needs assistance Equipment used: Rolling walker (2 wheeled) Transfers: Sit to/from Stand Sit to Stand: Mod assist  General transfer comment: slow to power up, maintains LLE slightly extended, R lean avoiding L weight-bearing despite VCs, maintains weight posteiror once standing despite VCs  Ambulation/Gait  General Gait Details: unable, pt fatigued with 2 STS, requesting to sit after ~60 sec each time stating "I'm just tired and hungry"; assisted back to supine  Stairs            Wheelchair Mobility    Modified Rankin (Stroke Patients Only)       Balance Overall balance assessment: Needs assistance Sitting-balance support: Feet supported;Single extremity supported Sitting balance-Leahy Scale: Fair Sitting balance - Comments: seated EOB, R lateral lean avoiding L weight-bearing   Standing balance support: During functional activity;Bilateral upper extremity supported Standing balance-Leahy Scale: Poor Standing balance comment: reliant on UE support, posterior lean against bed       Pertinent Vitals/Pain Pain Assessment: 0-10 Pain Score: 5  Pain Location: L hip Pain Descriptors / Indicators: Aching;Sore Pain Intervention(s): Limited activity within patient's tolerance;Monitored during session;Repositioned    Home Living Family/patient expects to be discharged to:: Private residence Living Arrangements: Spouse/significant other Available Help at Discharge: Family;Available 24 hours/day Type of Home: House Home Access: Stairs to enter Entrance Stairs-Rails: Right  Entrance Stairs-Number of Steps: 3 Home Layout: Two level;Able to live on main level with bedroom/bathroom Home Equipment: Gilford Rile - 2 wheels;Cane - single point;Shower seat      Prior Function Level of  Independence: Independent         Comments: Pt reports being independent with ADLs/IADLs, community ambulation, plays bridge, does yardwork and takes walks daily for exercise.     Hand Dominance   Dominant Hand: Right    Extremity/Trunk Assessment   Upper Extremity Assessment Upper Extremity Assessment: Overall WFL for tasks assessed    Lower Extremity Assessment Lower Extremity Assessment: LLE deficits/detail LLE Deficits / Details: ankle and knee AROM WNL, hip AROM ~50%, hip flexion 2-/5, hip abduction 2-/5, hip adduction 2-/5 LLE Sensation: decreased light touch (neuropathy at baseline per pt)    Cervical / Trunk Assessment Cervical / Trunk Assessment: Normal  Communication   Communication: No difficulties  Cognition Arousal/Alertness: Awake/alert Behavior During Therapy: WFL for tasks assessed/performed Overall Cognitive Status: Within Functional Limits for tasks assessed     General Comments General comments (skin integrity, edema, etc.): BP 132/82, HR 92 and SpO2 97% once returned to supine    Exercises     Assessment/Plan    PT Assessment Patient needs continued PT services  PT Problem List Decreased strength;Decreased range of motion;Decreased activity tolerance;Decreased balance;Decreased mobility;Decreased knowledge of use of DME;Pain       PT Treatment Interventions DME instruction;Gait training;Stair training;Functional mobility training;Therapeutic activities;Therapeutic exercise;Balance training;Patient/family education    PT Goals (Current goals can be found in the Care Plan section)  Acute Rehab PT Goals Patient Stated Goal: get back to daily walks PT Goal Formulation: With patient Time For Goal Achievement: 08/17/20 Potential to Achieve Goals: Good    Frequency Min 3X/week   Barriers to discharge        Co-evaluation               AM-PAC PT "6 Clicks" Mobility  Outcome Measure Help needed turning from your back to your side while  in a flat bed without using bedrails?: A Little Help needed moving from lying on your back to sitting on the side of a flat bed without using bedrails?: A Lot Help needed moving to and from a bed to a chair (including a wheelchair)?: A Lot Help needed standing up from a chair using your arms (e.g., wheelchair or bedside chair)?: A Lot Help needed to walk in hospital room?: Total Help needed climbing 3-5 steps with a railing? : Total 6 Click Score: 11    End of Session Equipment Utilized During Treatment: Gait belt Activity Tolerance: Patient tolerated treatment well;Other (comment) (limited by hunger, hasn't eaten since pre surgery) Patient left: in bed;with call bell/phone within reach;with bed alarm set Nurse Communication: Mobility status;Other (comment) (fatigue/hunger limiting, VSS) PT Visit Diagnosis: Other abnormalities of gait and mobility (R26.89);Pain Pain - Right/Left: Left Pain - part of body: Hip    Time: [id number] PT Time Calculation (min) (ACUTE ONLY): 21 min   Charges:   PT Evaluation $PT Eval Low Complexity: 1 Low           Tori Jonatha Gagen PT, DPT 08/03/20, 10:40 AM

## 2020-08-03 NOTE — Plan of Care (Signed)
  Problem: Activity: Goal: Risk for activity intolerance will decrease Outcome: Progressing   Problem: Pain Managment: Goal: General experience of comfort will improve Outcome: Progressing   Problem: Education: Goal: Knowledge of the prescribed therapeutic regimen will improve Outcome: Progressing

## 2020-08-03 NOTE — Progress Notes (Signed)
ANTICOAGULATION CONSULT NOTE - Initial Consult  Pharmacy Consult for warfarin Indication: atrial fibrillation  Allergies  Allergen Reactions   Sulfa Antibiotics Other (See Comments)    Granulocytosis    Patient Measurements: Height: 6' (182.9 cm) Weight: 80.6 kg (177 lb 11.1 oz) IBW/kg (Calculated) : 77.6  Vital Signs: Temp: 97.9 F (36.6 C) (07/27 1333) Temp Source: Oral (07/27 1333) BP: 121/54 (07/27 1333) Pulse Rate: 58 (07/27 1333)  Labs: Recent Labs    08/01/20 1809 08/01/20 1940 08/02/20 0632 08/02/20 0841 08/03/20 0306  HGB 13.1  --  12.1*  --  11.0*  HCT 38.3*  --  35.5*  --  33.0*  PLT 155  --  139*  --  123*  LABPROT  --  24.6*  --  26.1* 17.6*  INR  --  2.2*  --  2.4* 1.4*  CREATININE 0.86  --  0.81  --  0.79    Estimated Creatinine Clearance: 76.8 mL/min (by C-G formula based on SCr of 0.79 mg/dL).   Medical History: Past Medical History:  Diagnosis Date   Acquired dilation of ascending aorta and aortic root ([place])    16m aortic root and 459mascending aorta on echo 03/2020   Bladder stones    Borderline diabetes    BPH (benign prostatic hyperplasia)    Coronary artery disease    cardiologist-  dr skNat Matherhart NP--- per cath 06-02-2010 non-obstructive cad pLAD 30-40%   Diverticulosis of colon    Dysrhythmia    afib   Gout    Heart murmur    History of adenomatous polyp of colon    2002-- tubular adenoma   History of aortic insufficiency    severe -- s/p  AVR 08-03-2010   History of small bowel obstruction    02/ 2007 mechanical sbo s/p  surgical intervention;  partial sbo 09/ 2011 and 03-20-2011 resolved without surgical intervention   History of urinary retention    HTN (hypertension)    Peripheral neuropathy    Persistent atrial fibrillation (HCC)    S/P aortic valve replacement with prosthetic valve 08/03/2010   tissue valve   S/P Maze operation for atrial fibrillation 01/30/2018   Complete bilateral atrial lesion set using  bipolar radiofrequency and cryothermy with clipping of LA appendage   S/P MVR (mitral valve repair) 01/30/2018   Complex valvuloplasty including artificial Gore-tex neochord placement x4 and [employer] medics Annuloflex ring annuloplasty, size 28   S/P patent foramen ovale closure 08/03/2010   at same time AVR   S/P tricuspid valve repair 01/30/2018   Using an MC3 Annuloplasty ring, size 28   Stroke ([place]   Tricuspid regurgitation     Medications:  Medications Prior to Admission  Medication Sig Dispense Refill Last Dose   acetaminophen (TYLENOL) 500 MG tablet Take 500 mg by mouth daily as needed (pain).   Past Week   allopurinol (ZYLOPRIM) 300 MG tablet Take 150 mg by mouth every morning.   08/01/2020 at am   amLODipine (NORVASC) 10 MG tablet Take 1 tablet (10 mg total) by mouth daily. 90 tablet 3 08/01/2020 at am   amoxicillin (AMOXIL) 500 MG capsule Take 2,000 mg by mouth See admin instructions. Take 4 capsules (2000 mg) by mouth 1 hour prior to dental appointment   2 months ago   atenolol (TENORMIN) 25 MG tablet Take 12.5 mg by mouth every morning.   08/01/2020 at 8am   b complex vitamins tablet Take 1 tablet by mouth every  morning.   08/01/2020 at am   cholecalciferol (VITAMIN D3) 25 MCG (1000 UNIT) tablet Take 1,000 Units by mouth every morning.   08/01/2020 at am   Cinnamon 500 MG capsule Take 1,000 mg by mouth every morning.   08/01/2020 at am   diclofenac Sodium (VOLTAREN) 1 % GEL Apply 1 application topically daily. For knee pain   07/31/2020   ferrous sulfate 325 (65 FE) MG EC tablet Take 325 mg by mouth every morning.   08/01/2020 at am   finasteride (PROSCAR) 5 MG tablet Take 1 tablet (5 mg total) by mouth daily. 30 tablet 0 08/01/2020 at am   gabapentin (NEURONTIN) 100 MG capsule Take 100 mg by mouth daily with lunch.   08/01/2020   lisinopril (ZESTRIL) 10 MG tablet Take 1 tablet (10 mg total) by mouth daily. 90 tablet 3 08/01/2020 at am   tamsulosin (FLOMAX) 0.4 MG CAPS capsule Take 2  capsules (0.8 mg total) by mouth daily after breakfast. (Patient taking differently: Take 0.8 mg by mouth daily with lunch.) 30 capsule 0 08/01/2020 at 11am   warfarin (COUMADIN) 5 MG tablet TAKE 1 TO 1 AND 1/2 TABLETS BY MOUTH DAILY OR AS  DIRECTED BY ANTICOAGULATION CLINIC (Patient taking differently: Take 5-7.5 mg by mouth See admin instructions. Take 1 1/2 tablets (7.5 mg) by mouth on Monday with lunch, take 1 tablet (5 mg) on all other days of the week with lunch or as directed by anticoagulation clinic) 135 tablet 3 08/01/2020 at 11am   Scheduled:   allopurinol  150 mg Oral q morning   amLODipine  10 mg Oral Daily   aspirin  81 mg Oral BID   atenolol  12.5 mg Oral q morning   B-complex with vitamin C  1 tablet Oral q morning    ceFAZolin (ANCEF) IV  2 g Intravenous Q6H   Chlorhexidine Gluconate Cloth  6 each Topical Daily   cholecalciferol  1,000 Units Oral q morning   docusate sodium  100 mg Oral BID   feeding supplement  237 mL Oral BID BM   ferrous sulfate  325 mg Oral q morning   finasteride  5 mg Oral Daily   gabapentin  100 mg Oral Q lunch   lisinopril  10 mg Oral Daily   methylPREDNISolone acetate  40 mg Intra-articular Once   tamsulosin  0.8 mg Oral Q lunch   warfarin  7.5 mg Oral Once   [START ON 08/04/2020] Warfarin - Pharmacist Dosing Inpatient   Does not apply q1600   Assessment: 54 yoM with PMH CAD, persistent Afib on warfarin, valvular disease with repair of all heart valves, HTN, CVA, gout, admitted after fall and L hip Fx, now s/p L THA on 7/26. Elevated INR was treated with high doses of IV vitamin K. To resume warfarin today  Baseline INR subtherapeutic after 5 + 25 mg Vit K IV Prior anticoagulation: warfarin 5 mg daily except 7.5 mg on Mondays  Significant events:  Today, 08/03/2020: CBC: Hgb and Plt both slightly down s/p THA INR SUBtherapeutic Major drug interactions: none, continues on ASA from PTA No bleeding issues per nursing Eating 75% of meals  Goal  of Therapy: INR 2-3  Plan: Warfarin 7.5 mg PO tonight - would expect some degree of warfarin resistance given high Vit K doses Daily INR CBC at least q72 hr while on warfarin Monitor for signs of bleeding or thrombosis   Reuel Boom, PharmD, BCPS [phone] 08/03/2020, 7:24 PM

## 2020-08-03 NOTE — TOC Initial Note (Signed)
Transition of Care (TOC) - Initial/Assessment Note    Patient Details  Name: [name]. MRN: [id number] Date of Birth: 10/04/1935  Transition of Care [employer]) CM/SW Contact:    Lennart Pall, LCSW Phone Number: 08/03/2020, 12:29 PM  Clinical Narrative:                 Met yesterday and today with pt and wife to introduce TOC role and discuss dc plans.  Pt much brighter today and pleased he is through his surgery.  Notes he "didn't have a great" PT session this morning and hopeful this afternoon is improved.  Pt is very motivated to be able to return home with wife.  Wife, [name], is able to provide 24/7 support but hopeful can improve from initial mod assist level of function.  Will continue to follow and assist with dc referrals/ plans.  Expected Discharge Plan: [place] Barriers to Discharge: Continued Medical Work up   Patient Goals and CMS Choice Patient states their goals for this hospitalization and ongoing recovery are:: return home      Expected Discharge Plan and Services Expected Discharge Plan: [place] In-house Referral: Clinical Social Work     Living arrangements for the past 2 months: Single Family Home                 DME Arranged: N/A DME Agency: NA                  Prior Living Arrangements/Services Living arrangements for the past 2 months: [place] Lives with:: Spouse Patient language and need for interpreter reviewed:: Yes Do you feel safe going back to the place where you live?: Yes      Need for Family Participation in Patient Care: Yes (Comment) Care giver support system in place?: Yes (comment)   Criminal Activity/Legal Involvement Pertinent to Current Situation/Hospitalization: No - Comment as needed  Activities of Daily Living Home Assistive Devices/Equipment: Walker (specify type) ADL Screening (condition at time of admission) Patient's cognitive ability adequate to safely complete  daily activities?: Yes Is the patient deaf or have difficulty hearing?: No Does the patient have difficulty seeing, even when wearing glasses/contacts?: No Does the patient have difficulty concentrating, remembering, or making decisions?: No Patient able to express need for assistance with ADLs?: Yes Does the patient have difficulty dressing or bathing?: Yes Independently performs ADLs?: Yes (appropriate for developmental age) Communication: Independent Dressing (OT): Needs assistance Is this a change from baseline?: Pre-admission baseline Grooming: Independent Feeding: Independent Bathing: Independent Toileting: Needs assistance Is this a change from baseline?: Pre-admission baseline In/Out Bed: Needs assistance Is this a change from baseline?: Pre-admission baseline Walks in Home: Independent Is this a change from baseline?: Pre-admission baseline Does the patient have difficulty walking or climbing stairs?: Yes Weakness of Legs: Left Weakness of Arms/Hands: None  Permission Sought/Granted Permission sought to share information with : Family Supports Permission granted to share information with : Yes, Verbal Permission Granted  Share Information with NAME: [place] granted to share info w Relationship: spouse  Permission granted to share info w Contact Information: [phone]  Emotional Assessment Appearance:: Appears stated age Attitude/Demeanor/Rapport: Gracious, Engaged Affect (typically observed): Accepting, Pleasant Orientation: : Oriented to Self, Oriented to Place, Oriented to  Time, Oriented to Situation Alcohol / Substance Use: Not Applicable Psych Involvement: No (comment)  Admission diagnosis:  Closed left hip fracture, initial encounter ([place]) [S72.002A] Patient  Active Problem List   Diagnosis Date Noted   Malnutrition of moderate degree 08/02/2020   S/P left total hip arthroplasty 08/02/2020   Closed left hip fracture, initial  encounter ([place]) 08/01/2020   Chronic congestive heart failure ([place]) 05/26/2020   Elevated PSA 05/26/2020   Gastroduodenitis 05/26/2020   History of adenomatous polyp of colon 05/26/2020   History of anemia 05/26/2020   Hypercoagulable state ([place]) 05/26/2020   Impairment of balance 05/26/2020   Peripheral neuropathy 05/26/2020   Personal history of other malignant neoplasm of skin 05/26/2020   Prediabetes 05/26/2020   Presence of prosthetic heart valve 05/26/2020   Sleep disorder 05/26/2020   Thrombocytopenia ([place]) 05/26/2020   Vitamin D deficiency 05/26/2020   Acquired dilation of ascending aorta and aortic root (HCC)    Symptomatic anemia 09/05/2018   Anemia due to chronic blood loss 09/05/2018   GI bleed 09/05/2018   Long term current use of anticoagulant 02/10/2018   History of mitral valve repair 01/30/2018   S/P tricuspid valve repair 01/30/2018   S/P Maze operation for atrial fibrillation 01/30/2018   Tricuspid regurgitation    Mitral valve regurgitation 10/14/2017   Trochanteric bursitis of right hip 11/27/2016   History of cerebrovascular accident 11/27/2016   Persistent atrial fibrillation    Hyperglycemia    Right middle cerebral artery stroke ([place]) 66/64/8616   Acute embolic stroke (HCC)    Hypokalemia    Left hemiparesis (HCC)    Dysphagia, post-stroke    S/P AVR (aortic valve replacement)    Benign essential HTN    Diabetes mellitus type 2 in nonobese (HCC)    Benign prostatic hyperplasia with lower urinary tract symptoms    A-fib ([place]) 10/28/2016   CVA (cerebral vascular accident) ([place]) 10/24/2016   Essential hypertension    Gout    History of small bowel obstruction    History of BPH    S/P AVR 08/03/2010   Hypertrophy of prostate with urinary obstruction and other lower urinary tract symptoms (LUTS) 05/21/2001   PCP:  Marda Stalker, PA-C Pharmacy:   [place], [place] - [address] [place] [place] [address] Phone: [phone] Fax: [phone]  [facility]  ([place], [place] [address] [place] [address] Phone: [phone] Fax: [phone]     Social Determinants of Health (SDOH) Interventions    Readmission Risk Interventions No flowsheet data found.

## 2020-08-03 NOTE — Progress Notes (Signed)
PROGRESS NOTE    [name].  [id number] DOB: [birth year] DOA: 08/01/2020 PCP: Marda Stalker, PA-C    Brief Narrative:  [name] was admitted to the hospital with the working diagnosis of left hip fracture.   85 year old male past medical history for coronary artery disease, atrial fibrillation, aortic, mitral and tricuspid valve repair/replacement, hypertension, gout, BPH who presented after mechanical fall.  He sustained a mechanical fall after standing from his chair impacting his left hip.  He reported severe local pain post trauma and was brought into the hospital for further evaluation.  His blood pressure was 156/99, heart rate 82, respiratory rate 17, temperature 98.3, oxygen saturation 98% his lungs were clear to auscultation bilaterally heart S1-S2, present, rhythmic, soft abdomen, no lower extremity edema, tender left posterior hip.  Left hip radiograph with mildly displaced left femoral neck fracture, no dislocation.  Assessment & Plan:   Principal Problem:   Closed left hip fracture, initial encounter [facility]) Active Problems:   Essential hypertension   Gout   A-fib (HCC)   S/P AVR (aortic valve replacement)   Benign prostatic hyperplasia with lower urinary tract symptoms   Long term current use of anticoagulant   Peripheral neuropathy   Malnutrition of moderate degree   S/P left total hip arthroplasty   Left hip fracture. Sp total left hip replacement, pain is well controlled,. Continue with DVT prophylaxis, Follow with Pt and OT.   2. Chronic atrial fibrillation. Continue rate control with entolol and anticoagulation with warfarin  3. HTN,. Continue blood pressure control with lisinopril and amlodipine  4. BPH. Positive urinary retention, continue with in and out catheterization   5. Moderate calorie protein malnutrition. Continue with nutritional supplements.   Status is: Inpatient  Remains inpatient appropriate because:Inpatient level of  care appropriate due to severity of illness  Dispo: The patient is from: Home              Anticipated d/c is to: Home              Patient currently is not medically stable to d/c.   Difficult to place patient No   DVT prophylaxis: Warfarin   Code Status:   full  Family Communication:  I spoke with patient's wife  at the bedside, we talked in detail about patient's condition, plan of care and prognosis and all questions were addressed.      Nutrition Status: Nutrition Problem: Moderate Malnutrition Etiology: chronic illness (CAD) Signs/Symptoms: mild fat depletion, mild muscle depletion Interventions: Ensure Enlive (each supplement provides 350kcal and 20 grams of protein), Magic cup, MVI     Consultants:  Orthopedics   Procedures:  Right hip replacement     Subjective: Patient is feeling better, hip pain is controlled, no nausea or vomiting,   Objective: Vitals:   08/03/20 0500 08/03/20 0530 08/03/20 0937 08/03/20 1333  BP:  111/69 136/71 (!) 121/54  Pulse:  63 94 (!) 58  Resp:  '16 18 17  '$ Temp:  98.1 F (36.7 C) 99.1 F (37.3 C) 97.9 F (36.6 C)  TempSrc:  Oral Oral Oral  SpO2:  100% 96% 94%  Weight: 80.6 kg     Height:        Intake/Output Summary (Last 24 hours) at 08/03/2020 1710 Last data filed at 08/03/2020 1600 Gross per 24 hour  Intake 3578.53 ml  Output 3500 ml  Net 78.53 ml   Filed Weights   08/01/20 2233 08/02/20 1500 08/03/20 0500  Weight: 80.8 kg  79.4 kg 80.6 kg    Examination:   General: Not in pain or dyspnea, deconditioned  Neurology: Awake and alert, non focal  E ENT: no pallor, no icterus, oral mucosa moist Cardiovascular: No JVD. S1-S2 present, rhythmic, no gallops, rubs, or murmurs. No lower extremity edema. Pulmonary: vesicular breath sounds bilaterally, adequate air movement, no wheezing, rhonchi or rales. Gastrointestinal. Abdomen soft and non tender Skin. No rashes Musculoskeletal: no joint deformities     Data  Reviewed: I have personally reviewed following labs and imaging studies  CBC: Recent Labs  Lab 08/01/20 1809 08/02/20 0632 08/03/20 0306  WBC 13.0* 9.9 12.8*  NEUTROABS 11.0*  --   --   HGB 13.1 12.1* 11.0*  HCT 38.3* 35.5* 33.0*  MCV 96.5 96.7 98.2  PLT 155 139* [id number]*   Basic Metabolic Panel: Recent Labs  Lab 08/01/20 1809 08/02/20 0632 08/03/20 0306  NA 138 140 140  K 3.9 3.6 4.2  CL 106 108 107  CO2 21* 24 22  GLUCOSE 140* 108* 198*  BUN '19 15 17  '$ CREATININE 0.86 0.81 0.79  CALCIUM 9.3 9.1 8.8*   GFR: Estimated Creatinine Clearance: 76.8 mL/min (by C-G formula based on SCr of 0.79 mg/dL). Liver Function Tests: Recent Labs  Lab 08/01/20 1809  AST 23  ALT 17  ALKPHOS 56  BILITOT 0.8  PROT 7.8  ALBUMIN 4.5   No results for input(s): LIPASE, AMYLASE in the last 168 hours. No results for input(s): AMMONIA in the last 168 hours. Coagulation Profile: Recent Labs  Lab 08/01/20 1940 08/02/20 0841 08/03/20 0306  INR 2.2* 2.4* 1.4*   Cardiac Enzymes: No results for input(s): CKTOTAL, CKMB, CKMBINDEX, TROPONINI in the last 168 hours. BNP (last 3 results) No results for input(s): PROBNP in the last 8760 hours. HbA1C: No results for input(s): HGBA1C in the last 72 hours. CBG: Recent Labs  Lab 08/02/20 1927  GLUCAP 121*   Lipid Profile: No results for input(s): CHOL, HDL, LDLCALC, TRIG, CHOLHDL, LDLDIRECT in the last 72 hours. Thyroid Function Tests: No results for input(s): TSH, T4TOTAL, FREET4, T3FREE, THYROIDAB in the last 72 hours. Anemia Panel: No results for input(s): VITAMINB12, FOLATE, FERRITIN, TIBC, IRON, RETICCTPCT in the last 72 hours.    Radiology Studies: I have reviewed all of the imaging during this hospital visit personally     Scheduled Meds:  allopurinol  150 mg Oral q morning   amLODipine  10 mg Oral Daily   aspirin  81 mg Oral BID   atenolol  12.5 mg Oral q morning   B-complex with vitamin C  1 tablet Oral q morning     ceFAZolin (ANCEF) IV  2 g Intravenous Q6H   Chlorhexidine Gluconate Cloth  6 each Topical Daily   cholecalciferol  1,000 Units Oral q morning   docusate sodium  100 mg Oral BID   feeding supplement  237 mL Oral BID BM   ferrous sulfate  325 mg Oral q morning   finasteride  5 mg Oral Daily   gabapentin  100 mg Oral Q lunch   lisinopril  10 mg Oral Daily   methylPREDNISolone acetate  40 mg Intra-articular Once   tamsulosin  0.8 mg Oral Q lunch   Continuous Infusions:  sodium chloride 1,000 mL (08/02/20 2029)   sodium chloride 75 mL/hr at 08/02/20 2000   methocarbamol (ROBAXIN) IV     tranexamic acid       LOS: 2 days        [name]  Gerome Apley, MD

## 2020-08-03 NOTE — Progress Notes (Signed)
Orthopedic Tech Progress Note Patient Details:  [name]. 11-09-1935 [id number]  Ortho Devices Ortho Device/Splint Location: Trapeze bar Ortho Device/Splint Interventions: Application   Post Interventions Patient Tolerated: Well Instructions Provided: Care of device, Adjustment of device  [name] 08/03/2020, 3:45 PM

## 2020-08-04 DIAGNOSIS — S72002A Fracture of unspecified part of neck of left femur, initial encounter for closed fracture: Secondary | ICD-10-CM | POA: Diagnosis not present

## 2020-08-04 DIAGNOSIS — Z7901 Long term (current) use of anticoagulants: Secondary | ICD-10-CM | POA: Diagnosis not present

## 2020-08-04 DIAGNOSIS — I4811 Longstanding persistent atrial fibrillation: Secondary | ICD-10-CM | POA: Diagnosis not present

## 2020-08-04 DIAGNOSIS — E440 Moderate protein-calorie malnutrition: Secondary | ICD-10-CM | POA: Diagnosis not present

## 2020-08-04 LAB — CBC
HCT: 27.2 % — ABNORMAL LOW (ref 39.0–52.0)
Hemoglobin: 8.9 g/dL — ABNORMAL LOW (ref 13.0–17.0)
MCH: 33 pg (ref 26.0–34.0)
MCHC: 32.7 g/dL (ref 30.0–36.0)
MCV: 100.7 fL — ABNORMAL HIGH (ref 80.0–100.0)
Platelets: 116 10*3/uL — ABNORMAL LOW (ref 150–400)
RBC: 2.7 MIL/uL — ABNORMAL LOW (ref 4.22–5.81)
RDW: 13.6 % (ref 11.5–15.5)
WBC: 15.4 10*3/uL — ABNORMAL HIGH (ref 4.0–10.5)
nRBC: 0 % (ref 0.0–0.2)

## 2020-08-04 LAB — PROTIME-INR
INR: 1.3 — ABNORMAL HIGH (ref 0.8–1.2)
Prothrombin Time: 15.8 seconds — ABNORMAL HIGH (ref 11.4–15.2)

## 2020-08-04 MED ORDER — CALCIUM CARBONATE ANTACID 500 MG PO CHEW: 400.0000 mg | CHEWABLE_TABLET | Freq: Three times a day (TID) | ORAL | Status: DC | PRN

## 2020-08-04 MED ORDER — HYDROCODONE-ACETAMINOPHEN 5-325 MG PO TABS: 1.0000 | ORAL_TABLET | Freq: Four times a day (QID) | ORAL | 0 refills | Status: DC | PRN

## 2020-08-04 MED ORDER — METHOCARBAMOL 500 MG PO TABS: 500.0000 mg | ORAL_TABLET | Freq: Four times a day (QID) | ORAL | 0 refills | Status: DC | PRN

## 2020-08-04 MED ORDER — WARFARIN SODIUM 5 MG PO TABS
7.5000 mg | ORAL_TABLET | Freq: Once | ORAL | Status: AC
  Administered 2020-08-04: 7.5 mg via ORAL
  Filled 2020-08-04: qty 1

## 2020-08-04 NOTE — Progress Notes (Signed)
PROGRESS NOTE    [name].  [id number] DOB: June 22, 1935 DOA: 08/01/2020 PCP: Marda Stalker, PA-C    Brief Narrative:  Mr. Bautista was admitted to the hospital with the working diagnosis of left hip fracture.    86 year old male past medical history for coronary artery disease, atrial fibrillation, aortic, mitral and tricuspid valve repair/replacement, hypertension, gout, BPH who presented after mechanical fall.  He sustained a mechanical fall after standing from his chair impacting his left hip.  He reported severe local pain post trauma and was brought into the hospital for further evaluation.  His blood pressure was 156/99, heart rate 82, respiratory rate 17, temperature 98.3, oxygen saturation 98% his lungs were clear to auscultation bilaterally heart S1-S2, present, rhythmic, soft abdomen, no lower extremity edema, tender left posterior hip.   Left hip radiograph with mildly displaced left femoral neck fracture, no dislocation  07/26 patient underwent left total hip replacement.   Developed urinary retention and Foley catheter has been placed.    Assessment & Plan:   Principal Problem:   Closed left hip fracture, initial encounter [facility]) Active Problems:   Essential hypertension   Gout   A-fib (HCC)   S/P AVR (aortic valve replacement)   Benign prostatic hyperplasia with lower urinary tract symptoms   Long term current use of anticoagulant   Peripheral neuropathy   Malnutrition of moderate degree   S/P left total hip arthroplasty    Left hip fracture. Sp total left hip replacement,  His pain continue to be well controlled. Plan to follow up with home health services   2. Chronic atrial fibrillation. Rate control with entolol, continue  anticoagulation with warfarin   3. HTN,. On lisinopril and amlodipine for blood pressure control./   4. BPH. \Patient with persistent urinary retention, placed Foley catheter and will inform urology Dr Roni Bread  patient. Likely patient will go home with foley catheter and outpatient follow up. Continue with finesteride and tamsulosin. .    5. Moderate calorie protein malnutrition. Nutritional supplements.    6. Gout. No acute exacerbation, continue with allopurinol.   7. Iron deficiency anemia. Continue with iron supplementation.  Reactive leukocytosis, with wbc at 15, continue to hold on antibiotic therapy. Hgb is 8,9  Status is: Inpatient  Remains inpatient appropriate because:Inpatient level of care appropriate due to severity of illness  Dispo: The patient is from: Home              Anticipated d/c is to: Home              Patient currently is not medically stable to d/c.   Difficult to place patient No    DVT prophylaxis: Warfarin   Code Status:   full  Family Communication:  I spoke with patient's wife at the bedside, we talked in detail about patient's condition, plan of care and prognosis and all questions were addressed.      Nutrition Status: Nutrition Problem: Moderate Malnutrition Etiology: chronic illness (CAD) Signs/Symptoms: mild fat depletion, mild muscle depletion Interventions: Ensure Enlive (each supplement provides 350kcal and 20 grams of protein), Magic cup, MVI    Consultants:  Orthopedics   Procedures:  Total left hip replacement.     Subjective: Patient feeling better. Left hip pain is well controlled, continue to have urinary retention.  Placed foley   Objective: Vitals:   08/03/20 2227 08/04/20 0619 08/04/20 0719 08/04/20 1258  BP: (!) 111/53 129/70  120/73  Pulse: 69 70  72  Resp: 18  16  18  Temp: 98.2 F (36.8 C) 98.2 F (36.8 C)  98.2 F (36.8 C)  TempSrc: Oral Oral  Oral  SpO2: 97% 97%  94%  Weight:   83.1 kg   Height:        Intake/Output Summary (Last 24 hours) at 08/04/2020 1505 Last data filed at 08/04/2020 1302 Gross per 24 hour  Intake 1040 ml  Output 1200 ml  Net -160 ml   Filed Weights   08/02/20 1500 08/03/20 0500  08/04/20 0719  Weight: 79.4 kg 80.6 kg 83.1 kg    Examination:   General: Not in pain or dyspnea.  Neurology: Awake and alert, non focal  E ENT: no pallor, no icterus, oral mucosa moist Cardiovascular: No JVD. S1-S2 present, rhythmic, no gallops, rubs, or murmurs. No lower extremity edema. Pulmonary: positive breath sounds bilaterally, adequate air movement, no wheezing, rhonchi or rales. Gastrointestinal. Abdomen soft and non tender Skin. No rashes Musculoskeletal: no joint deformities     Data Reviewed: I have personally reviewed following labs and imaging studies  CBC: Recent Labs  Lab 08/01/20 1809 08/02/20 0632 08/03/20 0306 08/04/20 0326  WBC 13.0* 9.9 12.8* 15.4*  NEUTROABS 11.0*  --   --   --   HGB 13.1 12.1* 11.0* 8.9*  HCT 38.3* 35.5* 33.0* 27.2*  MCV 96.5 96.7 98.2 100.7*  PLT 155 139* 123* [id number]*   Basic Metabolic Panel: Recent Labs  Lab 08/01/20 1809 08/02/20 0632 08/03/20 0306  NA 138 140 140  K 3.9 3.6 4.2  CL 106 108 107  CO2 21* 24 22  GLUCOSE 140* 108* 198*  BUN '19 15 17  '$ CREATININE 0.86 0.81 0.79  CALCIUM 9.3 9.1 8.8*   GFR: Estimated Creatinine Clearance: 76.8 mL/min (by C-G formula based on SCr of 0.79 mg/dL). Liver Function Tests: Recent Labs  Lab 08/01/20 1809  AST 23  ALT 17  ALKPHOS 56  BILITOT 0.8  PROT 7.8  ALBUMIN 4.5   No results for input(s): LIPASE, AMYLASE in the last 168 hours. No results for input(s): AMMONIA in the last 168 hours. Coagulation Profile: Recent Labs  Lab 08/01/20 1940 08/02/20 0841 08/03/20 0306 08/04/20 0326  INR 2.2* 2.4* 1.4* 1.3*   Cardiac Enzymes: No results for input(s): CKTOTAL, CKMB, CKMBINDEX, TROPONINI in the last 168 hours. BNP (last 3 results) No results for input(s): PROBNP in the last 8760 hours. HbA1C: No results for input(s): HGBA1C in the last 72 hours. CBG: Recent Labs  Lab 08/02/20 1927  GLUCAP 121*   Lipid Profile: No results for input(s): CHOL, HDL, LDLCALC, TRIG,  CHOLHDL, LDLDIRECT in the last 72 hours. Thyroid Function Tests: No results for input(s): TSH, T4TOTAL, FREET4, T3FREE, THYROIDAB in the last 72 hours. Anemia Panel: No results for input(s): VITAMINB12, FOLATE, FERRITIN, TIBC, IRON, RETICCTPCT in the last 72 hours.    Radiology Studies: I have reviewed all of the imaging during this hospital visit personally     Scheduled Meds:  allopurinol  150 mg Oral q morning   amLODipine  10 mg Oral Daily   atenolol  12.5 mg Oral q morning   B-complex with vitamin C  1 tablet Oral q morning   Chlorhexidine Gluconate Cloth  6 each Topical Daily   cholecalciferol  1,000 Units Oral q morning   docusate sodium  100 mg Oral BID   feeding supplement  237 mL Oral BID BM   ferrous sulfate  325 mg Oral q morning   finasteride  5 mg  Oral Daily   gabapentin  100 mg Oral Q lunch   lisinopril  10 mg Oral Daily   methylPREDNISolone acetate  40 mg Intra-articular Once   tamsulosin  0.8 mg Oral Q lunch   warfarin  7.5 mg Oral ONCE-1600   Warfarin - Pharmacist Dosing Inpatient   Does not apply q1600   Continuous Infusions:  sodium chloride 1,000 mL (08/02/20 2029)   sodium chloride 75 mL/hr at 08/02/20 2000   methocarbamol (ROBAXIN) IV     tranexamic acid       LOS: 3 days        Kieran Nachtigal Gerome Apley, MD

## 2020-08-04 NOTE — Plan of Care (Signed)
?  Problem: Activity: ?Goal: Risk for activity intolerance will decrease ?Outcome: Progressing ?  ?Problem: Pain Managment: ?Goal: General experience of comfort will improve ?Outcome: Progressing ?  ?Problem: Nutrition: ?Goal: Adequate nutrition will be maintained ?Outcome: Progressing ?  ?

## 2020-08-04 NOTE — Progress Notes (Signed)
Subjective: 2 Days Post-Op Procedure(s) (LRB): TOTAL HIP ARTHROPLASTY ANTERIOR APPROACH (Left) Patient reports pain as mild.   Patient seen in rounds for Dr. Alvan Dame. Patient is well, and has had no acute complaints or problems other than concern about not being able to void. He reports the hip is sore when he walks, but otherwise feels okay.  We will continue therapy today.   Objective: Vital signs in last 24 hours: Temp:  [98.2 F (36.8 C)] 98.2 F (36.8 C) (07/28 1258) Pulse Rate:  [69-72] 72 (07/28 1258) Resp:  [16-18] 18 (07/28 1258) BP: (111-129)/(53-73) 120/73 (07/28 1258) SpO2:  [94 %-97 %] 94 % (07/28 1258) Weight:  [83.1 kg] 83.1 kg (07/28 0719)  Intake/Output from previous day:  Intake/Output Summary (Last 24 hours) at 08/04/2020 1418 Last data filed at 08/04/2020 1302 Gross per 24 hour  Intake 1040 ml  Output 1200 ml  Net -160 ml     Intake/Output this shift: Total I/O In: 440 [P.O.:440] Out: -   Labs: Recent Labs    08/01/20 1809 08/02/20 0632 08/03/20 0306 08/04/20 0326  HGB 13.1 12.1* 11.0* 8.9*   Recent Labs    08/03/20 0306 08/04/20 0326  WBC 12.8* 15.4*  RBC 3.36* 2.70*  HCT 33.0* 27.2*  PLT 123* 116*   Recent Labs    08/02/20 0632 08/03/20 0306  NA 140 140  K 3.6 4.2  CL 108 107  CO2 24 22  BUN 15 17  CREATININE 0.81 0.79  GLUCOSE 108* 198*  CALCIUM 9.1 8.8*   Recent Labs    08/03/20 0306 08/04/20 0326  INR 1.4* 1.3*    Exam: General - Patient is Alert and Oriented Extremity - Neurologically intact Sensation intact distally Intact pulses distally Dorsiflexion/Plantar flexion intact Dressing - dressing C/D/I Motor Function - intact, moving foot and toes well on exam.   Past Medical History:  Diagnosis Date   Acquired dilation of ascending aorta and aortic root (HCC)    59m aortic root and 42mascending aorta on echo 03/2020   Bladder stones    Borderline diabetes    BPH (benign prostatic hyperplasia)    Coronary  artery disease    cardiologist-  dr skMarlou PorchloCecille Rubinerhart NP--- per cath 06-02-2010 non-obstructive cad pLAD 30-40%   Diverticulosis of colon    Dysrhythmia    afib   Gout    Heart murmur    History of adenomatous polyp of colon    2002-- tubular adenoma   History of aortic insufficiency    severe -- s/p  AVR 08-03-2010   History of small bowel obstruction    02/ 2007 mechanical sbo s/p  surgical intervention;  partial sbo 09/ 2011 and 03-20-2011 resolved without surgical intervention   History of urinary retention    HTN (hypertension)    Peripheral neuropathy    Persistent atrial fibrillation (HCC)    S/P aortic valve replacement with prosthetic valve 08/03/2010   tissue valve   S/P Maze operation for atrial fibrillation 01/30/2018   Complete bilateral atrial lesion set using bipolar radiofrequency and cryothermy with clipping of LA appendage   S/P MVR (mitral valve repair) 01/30/2018   Complex valvuloplasty including artificial Gore-tex neochord placement x4 and Carbo medics Annuloflex ring annuloplasty, size 28   S/P patent foramen ovale closure 08/03/2010   at same time AVR   S/P tricuspid valve repair 01/30/2018   Using an MC3 Annuloplasty ring, size 28   Stroke (HCC)    Tricuspid regurgitation  Assessment/Plan: 2 Days Post-Op Procedure(s) (LRB): TOTAL HIP ARTHROPLASTY ANTERIOR APPROACH (Left) Principal Problem:   Closed left hip fracture, initial encounter [facility]) Active Problems:   Essential hypertension   Gout   A-fib (HCC)   S/P AVR (aortic valve replacement)   Benign prostatic hyperplasia with lower urinary tract symptoms   Long term current use of anticoagulant   Peripheral neuropathy   Malnutrition of moderate degree   S/P left total hip arthroplasty  Estimated body mass index is 24.85 kg/m as calculated from the following:   Height as of this encounter: 6' (1.829 m).   Weight as of this encounter: 83.1 kg. Advance diet Up with therapy  DVT Prophylaxis  - Coumadin, INR currently 1.3 Weight bearing as tolerated. Hgb stable at 8.9 this AM, secondary to ABLA.   Patient's main concern is regarding his inability to void. He sees Dr. Roni Bread normally for urology, and is hoping his group may be able to weigh in/consult in on his care. He is doing well in terms of the hip, and is stable for discharge from an orthopaedic standpoint when medically stable. Follow up in our office in 2 weeks. Dressing to remain in place.   [name], PA-C Orthopedic Surgery [phone] 08/04/2020, 2:18 PM

## 2020-08-04 NOTE — Care Management Important Message (Signed)
Important Message  Patient Details IM Letter given to the Patient. Name: [name]. MRN: [id number] Date of Birth: 1935/02/04   Medicare Important Message Given:  Yes     Kerin Salen 08/04/2020, 1:20 PM

## 2020-08-04 NOTE — Progress Notes (Signed)
Physical Therapy Treatment Patient Details Name: [name]. MRN: [id number] DOB: 04-13-1935 [name] Date: 08/04/2020    History of Present Illness Even Polio. is a 85 y.o. male who presents to the emergency department on 08/01/2020 with left hip pain and inability to ambulate after a fall at home. Pt is s/p L THA AA 08/02/20. PMH: CAD, afib, history of cardiac valve replacement/repair (aortic, mitral, and tricuspid), HTN, gout, BPH, urinary retention    PT Comments    Pt with significant improvement from eval, states he was able to ambulate twice yesterday with nursing after he ate some food. Pt ambulates 150 ft x2 with RW and supv for safety. Pt trained on stairs using single R handrail, demo'd good sequencing and no LOB with min guard. Pt's spouse present during session, provided HEP and stair training HEP and all questions answered. Pt does report 5/10 pain limiting his activity tolerance, provided pt with ice and offered education on pain relief. Updated d/c to HHPT with intermittent SUPV, spouse reports ability to assist pt at home. Will continue to progress acute PT as able.   Follow Up Recommendations  Home health PT;Supervision - Intermittent     Equipment Recommendations  None recommended by PT    Recommendations for Other Services       Precautions / Restrictions Precautions Precautions: Fall;Anterior Hip Restrictions Weight Bearing Restrictions: No LLE Weight Bearing: Weight bearing as tolerated    Mobility  Bed Mobility Overal bed mobility: Needs Assistance Bed Mobility: Supine to Sit  Supine to sit: Min guard  General bed mobility comments: increased time, able to self assist LLE over to EOB and return to supine without physical assist    Transfers Overall transfer level: Needs assistance Equipment used: Rolling walker (2 wheeled) Transfers: Sit to/from Stand Sit to Stand: Min guard;Supervision  General transfer comment: min guard from low  surface and supv from elevated surface, BUE assisting to power up, good steadiness upon rising  Ambulation/Gait Ambulation/Gait assistance: Supervision Gait Distance (Feet): 150 Feet (x2) Assistive device: Rolling walker (2 wheeled) Gait Pattern/deviations: Step-to pattern;Decreased stride length;Decreased weight shift to left Gait velocity: decreased   General Gait Details: pt ambulates 2 bouts with seated rest break between, step to pattern with RW and slight decreased L weight-shift, attempted step through pattern but too painful so returned to step to, no LOB or unsteadiness   Stairs Stairs: Yes Stairs assistance: Min guard Stair Management: One rail Right;Step to pattern Number of Stairs: 3 General stair comments: step to pattern using single R handrail, min guard for safety, pt able to maintain appropriate sequencing with verbal cues, no LOB   Wheelchair Mobility    Modified Rankin (Stroke Patients Only)       Balance Overall balance assessment: Needs assistance Sitting-balance support: Feet supported Sitting balance-Leahy Scale: Good Sitting balance - Comments: seated EOB   Standing balance support: During functional activity Standing balance-Leahy Scale: Fair Standing balance comment: fair static, reliant on RW dynamic         Cognition Arousal/Alertness: Awake/alert Behavior During Therapy: WFL for tasks assessed/performed Overall Cognitive Status: Within Functional Limits for tasks assessed         Exercises      General Comments        Pertinent Vitals/Pain Pain Assessment: 0-10 Pain Score: 5  Pain Location: L hip Pain Descriptors / Indicators: Aching;Sore Pain Intervention(s): Limited activity within patient's tolerance;Monitored during session;Repositioned;Ice applied    Home Living  Prior Function            PT Goals (current goals can now be found in the care plan section) Acute Rehab PT Goals Patient  Stated Goal: get back to daily walks PT Goal Formulation: With patient Time For Goal Achievement: 08/17/20 Potential to Achieve Goals: Good Progress towards PT goals: Progressing toward goals    Frequency    Min 3X/week      PT Plan Discharge plan needs to be updated    Co-evaluation              AM-PAC PT "6 Clicks" Mobility   Outcome Measure  Help needed turning from your back to your side while in a flat bed without using bedrails?: A Little Help needed moving from lying on your back to sitting on the side of a flat bed without using bedrails?: A Little Help needed moving to and from a bed to a chair (including a wheelchair)?: A Little Help needed standing up from a chair using your arms (e.g., wheelchair or bedside chair)?: A Little Help needed to walk in hospital room?: A Little Help needed climbing 3-5 steps with a railing? : A Little 6 Click Score: 18    End of Session Equipment Utilized During Treatment: Gait belt Activity Tolerance: Patient tolerated treatment well Patient left: in bed;with call bell/phone within reach;with bed alarm set;with family/visitor present Nurse Communication: Mobility status PT Visit Diagnosis: Other abnormalities of gait and mobility (R26.89);Pain Pain - Right/Left: Left Pain - part of body: Hip     Time: [id number] PT Time Calculation (min) (ACUTE ONLY): 33 min  Charges:  $Gait Training: 23-37 mins                      Tori Abraham Margulies PT, DPT 08/04/20, 12:00 PM

## 2020-08-04 NOTE — Progress Notes (Signed)
ANTICOAGULATION CONSULT NOTE - Follow Up Consult  Pharmacy Consult for Warfarin Indication: atrial fibrillation  Allergies  Allergen Reactions   Sulfa Antibiotics Other (See Comments)    Granulocytosis    Patient Measurements: Height: 6' (182.9 cm) Weight: 83.1 kg (183 lb 3.2 oz) IBW/kg (Calculated) : 77.6 Heparin Dosing Weight:   Vital Signs: Temp: 98.2 F (36.8 C) (07/28 0619) Temp Source: Oral (07/28 0619) BP: 129/70 (07/28 0619) Pulse Rate: 70 (07/28 0619)  Labs: Recent Labs    08/01/20 1809 08/01/20 1940 08/02/20 [id number] 08/02/20 0841 08/03/20 0306 08/04/20 0326  HGB 13.1  --  12.1*  --  11.0* 8.9*  HCT 38.3*  --  35.5*  --  33.0* 27.2*  PLT 155  --  139*  --  123* 116*  LABPROT  --    < >  --  26.1* 17.6* 15.8*  INR  --    < >  --  2.4* 1.4* 1.3*  CREATININE 0.86  --  0.81  --  0.79  --    < > = values in this interval not displayed.    Estimated Creatinine Clearance: 76.8 mL/min (by C-G formula based on SCr of 0.79 mg/dL).   Medications:  Scheduled:   allopurinol  150 mg Oral q morning   amLODipine  10 mg Oral Daily   aspirin  81 mg Oral BID   atenolol  12.5 mg Oral q morning   B-complex with vitamin C  1 tablet Oral q morning   Chlorhexidine Gluconate Cloth  6 each Topical Daily   cholecalciferol  1,000 Units Oral q morning   docusate sodium  100 mg Oral BID   feeding supplement  237 mL Oral BID BM   ferrous sulfate  325 mg Oral q morning   finasteride  5 mg Oral Daily   gabapentin  100 mg Oral Q lunch   lisinopril  10 mg Oral Daily   methylPREDNISolone acetate  40 mg Intra-articular Once   tamsulosin  0.8 mg Oral Q lunch   Warfarin - Pharmacist Dosing Inpatient   Does not apply q1600   Infusions:   sodium chloride 1,000 mL (08/02/20 2029)   sodium chloride 75 mL/hr at 08/02/20 2000   methocarbamol (ROBAXIN) IV     tranexamic acid      Assessment: 73 yoM with PMH CAD, persistent Afib on warfarin, valvular disease with repair of all heart  valves, HTN, CVA, gout, admitted after fall and L hip Fx, now s/p L THA on 7/26. Elevated INR was treated with high doses of IV vitamin K. Pharmacy consult to resume warfarin on 7/27. ? Baseline INR subtherapeutic after '5mg'$  + 20 mg Vit K IV ? Prior anticoagulation: warfarin 5 mg daily except 7.5 mg on Mondays  Today, 08/04/2020:  INR 1.3, remains subtherapeutic CBC Hgb decreased to 8.9, Plt down to 116 No bleeding or complications reported. Diet: regular 75-100% meals charted Drug-drug interactions: expect that Vit K will suppress INR for several days.    Goal of Therapy:  INR 2-3 Monitor platelets by anticoagulation protocol: Yes   Plan:  Warfarin 7.5 mg PO x 1. Daily PT/INR. Monitor for signs and symptoms of bleeding.   Gretta Arab PharmD, BCPS Clinical Pharmacist [facility] main pharmacy [phone] 08/04/2020 11:09 AM

## 2020-08-05 DIAGNOSIS — I4811 Longstanding persistent atrial fibrillation: Secondary | ICD-10-CM | POA: Diagnosis not present

## 2020-08-05 DIAGNOSIS — Z7901 Long term (current) use of anticoagulants: Secondary | ICD-10-CM | POA: Diagnosis not present

## 2020-08-05 DIAGNOSIS — I10 Essential (primary) hypertension: Secondary | ICD-10-CM

## 2020-08-05 DIAGNOSIS — S72002A Fracture of unspecified part of neck of left femur, initial encounter for closed fracture: Secondary | ICD-10-CM | POA: Diagnosis not present

## 2020-08-05 LAB — CBC WITH DIFFERENTIAL/PLATELET
Abs Immature Granulocytes: 0.12 10*3/uL — ABNORMAL HIGH (ref 0.00–0.07)
Basophils Absolute: 0 10*3/uL (ref 0.0–0.1)
Basophils Relative: 0 %
Eosinophils Absolute: 0 10*3/uL (ref 0.0–0.5)
Eosinophils Relative: 0 %
HCT: 26.1 % — ABNORMAL LOW (ref 39.0–52.0)
Hemoglobin: 8.5 g/dL — ABNORMAL LOW (ref 13.0–17.0)
Immature Granulocytes: 1 %
Lymphocytes Relative: 13 %
Lymphs Abs: 1.8 10*3/uL (ref 0.7–4.0)
MCH: 32.3 pg (ref 26.0–34.0)
MCHC: 32.6 g/dL (ref 30.0–36.0)
MCV: 99.2 fL (ref 80.0–100.0)
Monocytes Absolute: 1 10*3/uL (ref 0.1–1.0)
Monocytes Relative: 7 %
Neutro Abs: 10.8 10*3/uL — ABNORMAL HIGH (ref 1.7–7.7)
Neutrophils Relative %: 79 %
Platelets: 121 10*3/uL — ABNORMAL LOW (ref 150–400)
RBC: 2.63 MIL/uL — ABNORMAL LOW (ref 4.22–5.81)
RDW: 13.9 % (ref 11.5–15.5)
WBC: 13.8 10*3/uL — ABNORMAL HIGH (ref 4.0–10.5)
nRBC: 0 % (ref 0.0–0.2)

## 2020-08-05 LAB — PROTIME-INR
INR: 1.2 (ref 0.8–1.2)
Prothrombin Time: 15 seconds (ref 11.4–15.2)

## 2020-08-05 MED ORDER — POLYETHYLENE GLYCOL 3350 17 G PO PACK: 17.0000 g | PACK | Freq: Every day | ORAL | 0 refills | Status: DC | PRN

## 2020-08-05 MED ORDER — ENSURE ENLIVE PO LIQD: 237.0000 mL | Freq: Two times a day (BID) | ORAL | 0 refills | Status: AC

## 2020-08-05 MED ORDER — PANTOPRAZOLE SODIUM 40 MG PO TBEC: 40.0000 mg | DELAYED_RELEASE_TABLET | Freq: Every day | ORAL | 0 refills | Status: DC

## 2020-08-05 MED ORDER — PANTOPRAZOLE SODIUM 40 MG PO TBEC
40.0000 mg | DELAYED_RELEASE_TABLET | Freq: Every day | ORAL | Status: DC
  Administered 2020-08-05: 40 mg via ORAL
  Filled 2020-08-05: qty 1

## 2020-08-05 NOTE — Anesthesia Postprocedure Evaluation (Signed)
Anesthesia Post Note  Patient: [name].  Procedure(s) Performed: TOTAL HIP ARTHROPLASTY ANTERIOR APPROACH (Left: Hip)     Patient location during evaluation: PACU Anesthesia Type: General Level of consciousness: awake and alert Pain management: pain level controlled Vital Signs Assessment: post-procedure vital signs reviewed and stable Respiratory status: spontaneous breathing, nonlabored ventilation, respiratory function stable and patient connected to nasal cannula oxygen Cardiovascular status: blood pressure returned to baseline and stable Postop Assessment: no apparent nausea or vomiting Anesthetic complications: no   No notable events documented.  Last Vitals:  Vitals:   08/05/20 0832 08/05/20 0835  BP: 138/74   Pulse: (!) 57 (!) 52  Resp: 16   Temp: 36.6 C   SpO2: 97%     Last Pain:  Vitals:   08/05/20 0832  TempSrc: Oral  PainSc:                  [name]

## 2020-08-05 NOTE — Progress Notes (Signed)
Physical Therapy Treatment Patient Details Name: [name]. MRN: [id number] DOB: Aug 18, 1935 [name] Date: 08/05/2020    History of Present Illness Gerald Hor. is a 85 y.o. male who presents to the emergency department on 08/01/2020 with left hip pain and inability to ambulate after a fall at home. Pt is s/p L THA AA 08/02/20. PMH: CAD, afib, history of cardiac valve replacement/repair (aortic, mitral, and tricuspid), HTN, gout, BPH, urinary retention    PT Comments    Patient making excellent progress with mobility. Cues throughout to slow down and communicate with spouse who provided safe guarding during gait/stair mobility. Cues/supervision provided by therapist throughout. All questions addressed and pt is at safe level for discharge home with assist from spouse.      08/05/20 1300  PT Visit Information  Last PT Received On 08/05/20  Assistance Needed +1  History of Present Illness [name]. is a 85 y.o. male who presents to the emergency department on 08/01/2020 with left hip pain and inability to ambulate after a fall at home. Pt is s/p L THA AA 08/02/20. PMH: CAD, afib, history of cardiac valve replacement/repair (aortic, mitral, and tricuspid), HTN, gout, BPH, urinary retention  Subjective Data  Subjective go home  Patient Stated Goal get back to daily walks  Precautions  Precautions Fall;Anterior Hip  Restrictions  Weight Bearing Restrictions No  LLE Weight Bearing WBAT  Pain Assessment  Pain Assessment Faces  Faces Pain Scale 2  Pain Location L hip  Pain Descriptors / Indicators Aching;Sore  Pain Intervention(s) Limited activity within patient's tolerance;Monitored during session;Repositioned  Cognition  Arousal/Alertness Awake/alert  Behavior During Therapy WFL for tasks assessed/performed  Overall Cognitive Status Within Functional Limits for tasks assessed  Bed Mobility  Overal bed mobility Needs Assistance  Bed Mobility Supine to  Sit;Sit to Supine  Supine to sit Supervision;HOB elevated  Sit to supine Supervision;HOB elevated  General bed mobility comments pt taking extra time, no assist needed.  Transfers  Overall transfer level Needs assistance  Equipment used Rolling walker (2 wheeled)  Transfers Sit to/from Stand  Sit to Stand Min guard;Supervision  General transfer comment guard/supervision from low bed height, no cues needed as pt demo'd good carryover for hand placement to rise and safe reach back.  Ambulation/Gait  Ambulation/Gait assistance Supervision  Gait Distance (Feet) 110 Feet  Assistive device Rolling walker (2 wheeled)  Gait Pattern/deviations Step-to pattern;Decreased stride length;Decreased weight shift to left  General Gait Details pt ambulated ~110', min guard from pt's wife with cues and supervision provided by therapist. No overt LOB noted.  Gait velocity decreased  Stairs Yes  Stairs assistance Min guard  Stair Management One rail Right;Step to pattern;Forwards  Number of Stairs 3  General stair comments VC's for sequencing "up with good, down with bad" and instructed pt's wife in safe guarding position for stairs. no LOB noted. Pt fatigeud after 1 bout and therapist reviewed steps for second bout with pt's wife.  Balance  Overall balance assessment Needs assistance  Sitting-balance support Feet supported  Sitting balance-Leahy Scale Good  Standing balance support During functional activity  Standing balance-Leahy Scale Fair  PT - End of Session  Equipment Utilized During Treatment Gait belt  Activity Tolerance Patient tolerated treatment well  Patient left in bed;with call bell/phone within reach;with family/visitor present  Nurse Communication Mobility status   PT - Assessment/Plan  PT Plan Current plan remains appropriate  PT Visit Diagnosis Other abnormalities of gait and mobility (  R26.89);Pain  PT Frequency (ACUTE ONLY) Min 3X/week  Follow Up Recommendations Home health  PT;Supervision - Intermittent  PT equipment None recommended by PT  AM-PAC PT "6 Clicks" Mobility Outcome Measure (Version 2)  Help needed turning from your back to your side while in a flat bed without using bedrails? 3  Help needed moving from lying on your back to sitting on the side of a flat bed without using bedrails? 3  Help needed moving to and from a bed to a chair (including a wheelchair)? 3  Help needed standing up from a chair using your arms (e.g., wheelchair or bedside chair)? 3  Help needed to walk in hospital room? 3  Help needed climbing 3-5 steps with a railing?  3  6 Click Score 18  Consider Recommendation of Discharge To: Home with [facility]  Progressive Mobility  What is the highest level of mobility based on the progressive mobility assessment? Level 4 (Walks with assist in room) - Balance while marching in place and cannot step forward and back - Complete  Mobility Ambulated with assistance in hallway  PT Goal Progression  Progress towards PT goals Progressing toward goals  Acute Rehab PT Goals  PT Goal Formulation With patient  Time For Goal Achievement 08/17/20  Potential to Achieve Goals Good  PT Time Calculation  PT Start Time (ACUTE ONLY) 1256  PT Stop Time (ACUTE ONLY) 1315  PT Time Calculation (min) (ACUTE ONLY) 19 min  PT General Charges  $$ ACUTE PT VISIT 1 Visit  PT Treatments  $Gait Training 8-22 mins    Verner Mould, DPT Acute Rehabilitation Services Office [phone] Pager [phone]  Jacques Navy 08/05/2020, 7:19 PM

## 2020-08-05 NOTE — Discharge Summary (Addendum)
Physician Discharge Summary  [name]. [id number] DOB: [birth year] DOA: 08/01/2020  PCP: Gerald Stalker, PA-C  Admit date: 08/01/2020 Discharge date: 08/05/2020  Admitted From: Home Disposition:  Home   Recommendations for Outpatient Follow-up and new medication changes:  Follow up with Gerald Stalker PA-C in 7 to 10 days.  Follow up with [name] as outpatient to follow up with urinary retention. Patient is being discharge with foley catheter.  Added pantoprazole for dyspepsia    Home Health: yes   Equipment/Devices: foley    Discharge Condition: stable  CODE STATUS: full  Diet recommendation:  heart healthy   Brief/Interim Summary: [name] was admitted to the hospital with the working diagnosis of left hip fracture.    85 year old male past medical history for coronary artery disease, atrial fibrillation, aortic, mitral and tricuspid valve repair/replacement, hypertension, gout, BPH who presented after mechanical fall.  He sustained a mechanical fall after standing from his chair impacting his left hip.  He reported severe local pain post trauma and was brought into the hospital for further evaluation.  His blood pressure was 156/99, heart rate 82, respiratory rate 17, temperature 98.3, oxygen saturation 98% his lungs were clear to auscultation bilaterally heart S1-S2, present, rhythmic, soft abdomen, no lower extremity edema, tender left posterior hip.  Na 138, K 3,9, Cl 106, bicarb 21, Glucose 140, BUN 19 and cr at 0,86.  White count 13.0, hemoglobin 13.1, hematocrit 38.3, platelets 155. SARS COVID-19 negative.  Chest radiograph with hyperinflation.  EKG 78 bpm, normal axis, normal intervals, sinus rhythm with PACs, poor R wave progression, no significant ST segment or T wave changes.   Left hip radiograph with mildly displaced left femoral neck fracture, no dislocation   07/26 patient underwent left total hip replacement.   Developed urinary retention  and Foley catheter has been placed.   Left hip fracture.  Patient was admitted to the medical ward, he received analgesics and DVT prophylaxis.  Underwent total left hip replacement with good toleration.  Patient was seen by physical therapy and Occupational Therapy. Recommended home health services.  2.  Acute urinary retention, BPH.  Postprocedure patient had urinary retention, required frequent in and out catheterization.  Eventually a Foley catheter was placed. Patient will continue with finasteride and tamsulosin. He will be discharged home with Foley catheter, follow-up with Gerald. Jeffie Ayers as an outpatient from urology.  3.  Chronic atrial fibrillation/ sp AV replacement.  Continue rate control with atenolol, anticoagulation with warfarin. Discharge INR 1.2.  4.  Hypertension.  Continue blood pressure control lisinopril amlodipine.  5.  Moderate calorie protein malnutrition, iron deficiency anemia, reactive leukocytosis. Patient was placed on nutritional supplements, his white cell count improved, no signs of systemic infection.  At discharge white cell count is 13.8. His discharge hemoglobin is 8.5. Follow-up cell count as an outpatient.  6.  Gout.  No signs of acute exacerbation   Discharge Diagnoses:  Principal Problem:   Closed left hip fracture, initial encounter [facility]) Active Problems:   Essential hypertension   Gout   A-fib (HCC)   S/P AVR (aortic valve replacement)   Benign prostatic hyperplasia with lower urinary tract symptoms   Long term current use of anticoagulant   Peripheral neuropathy   Malnutrition of moderate degree   S/P left total hip arthroplasty    Discharge Instructions   Allergies as of 08/05/2020       Reactions   Sulfa Antibiotics Other (See Comments)   Granulocytosis  Medication List     STOP taking these medications    acetaminophen 500 MG tablet Commonly known as: TYLENOL       TAKE these medications    allopurinol 300  MG tablet Commonly known as: ZYLOPRIM Take 150 mg by mouth every morning.   amLODipine 10 MG tablet Commonly known as: NORVASC Take 1 tablet (10 mg total) by mouth daily.   amoxicillin 500 MG capsule Commonly known as: AMOXIL Take 2,000 mg by mouth See admin instructions. Take 4 capsules (2000 mg) by mouth 1 hour prior to dental appointment   atenolol 25 MG tablet Commonly known as: TENORMIN Take 12.5 mg by mouth every morning.   b complex vitamins tablet Take 1 tablet by mouth every morning.   cholecalciferol 25 MCG (1000 UNIT) tablet Commonly known as: VITAMIN D3 Take 1,000 Units by mouth every morning.   Cinnamon 500 MG capsule Take 1,000 mg by mouth every morning.   diclofenac Sodium 1 % Gel Commonly known as: VOLTAREN Apply 1 application topically daily. For knee pain   feeding supplement Liqd Take 237 mLs by mouth 2 (two) times daily between meals.   ferrous sulfate 325 (65 FE) MG EC tablet Take 325 mg by mouth every morning.   finasteride 5 MG tablet Commonly known as: PROSCAR Take 1 tablet (5 mg total) by mouth daily.   gabapentin 100 MG capsule Commonly known as: NEURONTIN Take 100 mg by mouth daily with lunch.   HYDROcodone-acetaminophen 5-325 MG tablet Commonly known as: NORCO/VICODIN Take 1-2 tablets by mouth every 6 (six) hours as needed for severe pain.   lisinopril 10 MG tablet Commonly known as: ZESTRIL Take 1 tablet (10 mg total) by mouth daily.   methocarbamol 500 MG tablet Commonly known as: ROBAXIN Take 1 tablet (500 mg total) by mouth every 6 (six) hours as needed for muscle spasms.   pantoprazole 40 MG tablet Commonly known as: PROTONIX Take 1 tablet (40 mg total) by mouth daily for 15 days.   polyethylene glycol 17 g packet Commonly known as: MIRALAX / GLYCOLAX Take 17 g by mouth daily as needed for mild constipation.   tamsulosin 0.4 MG Caps capsule Commonly known as: FLOMAX Take 2 capsules (0.8 mg total) by mouth daily after  breakfast.   warfarin 5 MG tablet Commonly known as: COUMADIN Take as directed. If you are unsure how to take this medication, talk to your nurse or doctor. Original instructions: TAKE 1 TO 1 AND 1/2 TABLETS BY MOUTH DAILY OR AS  DIRECTED BY ANTICOAGULATION CLINIC        Follow-up Information     Paralee Cancel, MD. Schedule an appointment as soon as possible for a visit in 2 week(s).   Specialty: Orthopedic Surgery Contact information: [address] [place] [address] [phone]         Irine Seal, MD Follow up on 08/16/2020.   Specialty: Urology Why: 9:45 am Contact information: [address] [place] [address] [phone]                Allergies  Allergen Reactions   Sulfa Antibiotics Other (See Comments)    Granulocytosis    Consultations: Orthopedics    Procedures/Studies: Chest Portable 1 View  Result Date: 08/02/2020 CLINICAL DATA:  History of heart disease. EXAM: PORTABLE CHEST 1 VIEW COMPARISON:  03/03/2018. FINDINGS: Prior cardiac valve replacement. Left atrial appendage clip in stable position. Stable mild cardiomegaly. No pulmonary venous congestion. No focal infiltrate. No pleural  effusion or pneumothorax. IMPRESSION: 1.  Prior cardiac valve replacement.  Stable mild cardiomegaly. 2.  No acute infiltrate. Electronically Signed   By: Marcello Moores  Register   On: 08/02/2020 08:05   DG C-Arm 1-60 Min-No Report  Result Date: 08/02/2020 CLINICAL DATA:  Left hip arthroplasty. EXAM: OPERATIVE left HIP (WITH PELVIS IF PERFORMED) 3 VIEWS TECHNIQUE: Fluoroscopic spot image(s) were submitted for interpretation post-operatively. Radiation exposure index: 0.9513 mGy. COMPARISON:  None. FINDINGS: Three intraoperative fluoroscopic images were obtained of the left hip. The left acetabular and femoral components appear to be well situated. IMPRESSION: Fluoroscopic guidance provided during left total hip arthroplasty. Electronically Signed   By:  Marijo Conception M.D.   On: 08/02/2020 19:52   DG HIP OPERATIVE UNILAT W OR W/O PELVIS LEFT  Result Date: 08/02/2020 CLINICAL DATA:  Left hip arthroplasty. EXAM: OPERATIVE left HIP (WITH PELVIS IF PERFORMED) 3 VIEWS TECHNIQUE: Fluoroscopic spot image(s) were submitted for interpretation post-operatively. Radiation exposure index: 0.9513 mGy. COMPARISON:  None. FINDINGS: Three intraoperative fluoroscopic images were obtained of the left hip. The left acetabular and femoral components appear to be well situated. IMPRESSION: Fluoroscopic guidance provided during left total hip arthroplasty. Electronically Signed   By: Marijo Conception M.D.   On: 08/02/2020 19:52   DG Hip Unilat W or Wo Pelvis 2-3 Views Left  Result Date: 08/01/2020 CLINICAL DATA:  85 year old male with fall and left hip pain. EXAM: DG HIP (WITH OR WITHOUT PELVIS) 2-3V LEFT COMPARISON:  None. FINDINGS: There is a mildly displaced fracture of the left femoral neck involving the basicervical region. The bones are osteopenic. No dislocation. Mild bilateral hip arthritic changes. There is degenerative changes of the lower lumbar spine. The soft tissues are unremarkable. Multiple surgical clips noted over the right hip. IMPRESSION: Mildly displaced fracture of the left femoral neck. No dislocation. Electronically Signed   By: Anner Crete M.D.   On: 08/01/2020 19:24     Procedures:  total left hip replacement.   Subjective: Patient is feeling well, no nausea or vomiting, no chest pain or dyspnea, left hip pain is well controlled.   Discharge Exam: Vitals:   08/05/20 0832 08/05/20 0835  BP: 138/74   Pulse: (!) 57 (!) 52  Resp: 16   Temp: 97.9 F (36.6 C)   SpO2: 97%    Vitals:   08/05/20 0435 08/05/20 0500 08/05/20 0832 08/05/20 0835  BP:   138/74   Pulse: (!) 52  (!) 57 (!) 52  Resp:   16   Temp:   97.9 F (36.6 C)   TempSrc:   Oral   SpO2:   97%   Weight:  85.7 kg    Height:        General: Not in pain or dyspnea.   Neurology: Awake and alert, non focal  E ENT: no pallor, no icterus, oral mucosa moist Cardiovascular: No JVD. S1-S2 present, rhythmic, no gallops, rubs, or murmurs. No lower extremity edema. Pulmonary: positive breath sounds bilaterally, adequate air movement, no wheezing, rhonchi or rales. Gastrointestinal. Abdomen soft and non tender Skin. No rashes Musculoskeletal: no joint deformities   The results of significant diagnostics from this hospitalization (including imaging, microbiology, ancillary and laboratory) are listed below for reference.     Microbiology: Recent Results (from the past 240 hour(s))  Resp Panel by RT-PCR (Flu A&B, Covid) Nasopharyngeal Swab     Status: None   Collection Time: 08/01/20  6:42 PM   Specimen: Nasopharyngeal Swab; Nasopharyngeal(NP) swabs in vial  transport medium  Result Value Ref Range Status   SARS Coronavirus 2 by RT PCR NEGATIVE NEGATIVE Final    Comment: (NOTE) SARS-CoV-2 target nucleic acids are NOT DETECTED.  The SARS-CoV-2 RNA is generally detectable in upper respiratory specimens during the acute phase of infection. The lowest concentration of SARS-CoV-2 viral copies this assay can detect is 138 copies/mL. A negative result does not preclude SARS-Cov-2 infection and should not be used as the sole basis for treatment or other patient management decisions. A negative result may occur with  improper specimen collection/handling, submission of specimen other than nasopharyngeal swab, presence of viral mutation(s) within the areas targeted by this assay, and inadequate number of viral copies(<138 copies/mL). A negative result must be combined with clinical observations, patient history, and epidemiological information. The expected result is Negative.  Fact Sheet for Patients:  EntrepreneurPulse.com.au  Fact Sheet for Healthcare Providers:  IncredibleEmployment.be  This test is no t yet approved or  cleared by the [place] FDA and  has been authorized for detection and/or diagnosis of SARS-CoV-2 by FDA under an Emergency Use Authorization (EUA). This EUA will remain  in effect (meaning this test can be used) for the duration of the COVID-19 declaration under Section 564(b)(1) of the Act, 21 U.S.C.section 360bbb-3(b)(1), unless the authorization is terminated  or revoked sooner.       Influenza A by PCR NEGATIVE NEGATIVE Final   Influenza B by PCR NEGATIVE NEGATIVE Final    Comment: (NOTE) The Xpert Xpress SARS-CoV-2/FLU/RSV plus assay is intended as an aid in the diagnosis of influenza from Nasopharyngeal swab specimens and should not be used as a sole basis for treatment. Nasal washings and aspirates are unacceptable for Xpert Xpress SARS-CoV-2/FLU/RSV testing.  Fact Sheet for Patients: EntrepreneurPulse.com.au  Fact Sheet for Healthcare Providers: IncredibleEmployment.be  This test is not yet approved or cleared by the [place] FDA and has been authorized for detection and/or diagnosis of SARS-CoV-2 by FDA under an Emergency Use Authorization (EUA). This EUA will remain in effect (meaning this test can be used) for the duration of the COVID-19 declaration under Section 564(b)(1) of the Act, 21 U.S.C. section 360bbb-3(b)(1), unless the authorization is terminated or revoked.  Performed at [facility], [place] [address], [place], [place] [address]   Surgical pcr screen     Status: None   Collection Time: 08/02/20 11:17 AM   Specimen: Nasal Mucosa; Nasal Swab  Result Value Ref Range Status   MRSA, PCR NEGATIVE NEGATIVE Final   Staphylococcus aureus NEGATIVE NEGATIVE Final    Comment: (NOTE) The Xpert SA Assay (FDA approved for NASAL specimens in patients 70 years of age and older), is one component of a comprehensive surveillance program. It is not intended to diagnose infection nor to guide or monitor  treatment. Performed at [facility], [place] [address] [place], [place] [address]      Labs: BNP (last 3 results) No results for input(s): BNP in the last 8760 hours. Basic Metabolic Panel: Recent Labs  Lab 08/01/20 1809 08/02/20 0632 08/03/20 0306  NA 138 140 140  K 3.9 3.6 4.2  CL 106 108 107  CO2 21* 24 22  GLUCOSE 140* 108* 198*  BUN '19 15 17  '$ CREATININE 0.86 0.81 0.79  CALCIUM 9.3 9.1 8.8*   Liver Function Tests: Recent Labs  Lab 08/01/20 1809  AST 23  ALT 17  ALKPHOS 56  BILITOT 0.8  PROT 7.8  ALBUMIN 4.5   No results  for input(s): LIPASE, AMYLASE in the last 168 hours. No results for input(s): AMMONIA in the last 168 hours. CBC: Recent Labs  Lab 08/01/20 1809 08/02/20 0632 08/03/20 0306 08/04/20 0326 08/05/20 0314  WBC 13.0* 9.9 12.8* 15.4* 13.8*  NEUTROABS 11.0*  --   --   --  10.8*  HGB 13.1 12.1* 11.0* 8.9* 8.5*  HCT 38.3* 35.5* 33.0* 27.2* 26.1*  MCV 96.5 96.7 98.2 100.7* 99.2  PLT 155 139* 123* 116* 121*   Cardiac Enzymes: No results for input(s): CKTOTAL, CKMB, CKMBINDEX, TROPONINI in the last 168 hours. BNP: Invalid input(s): POCBNP CBG: Recent Labs  Lab 08/02/20 1927  GLUCAP 121*   D-Dimer No results for input(s): DDIMER in the last 72 hours. Hgb A1c No results for input(s): HGBA1C in the last 72 hours. Lipid Profile No results for input(s): CHOL, HDL, LDLCALC, TRIG, CHOLHDL, LDLDIRECT in the last 72 hours. Thyroid function studies No results for input(s): TSH, T4TOTAL, T3FREE, THYROIDAB in the last 72 hours.  Invalid input(s): FREET3 Anemia work up No results for input(s): VITAMINB12, FOLATE, FERRITIN, TIBC, IRON, RETICCTPCT in the last 72 hours. Urinalysis    Component Value Date/Time   COLORURINE YELLOW 01/20/2018 1230   APPEARANCEUR HAZY (A) 01/20/2018 1230   LABSPEC 1.014 01/20/2018 1230   PHURINE 5.0 01/20/2018 1230   GLUCOSEU NEGATIVE 01/20/2018 1230   HGBUR SMALL (A) 01/20/2018 1230    BILIRUBINUR NEGATIVE 01/20/2018 1230   KETONESUR NEGATIVE 01/20/2018 1230   PROTEINUR NEGATIVE 01/20/2018 1230   UROBILINOGEN 0.2 08/01/2010 0924   NITRITE POSITIVE (A) 01/20/2018 1230   LEUKOCYTESUR TRACE (A) 01/20/2018 1230   Sepsis Labs Invalid input(s): PROCALCITONIN,  WBC,  LACTICIDVEN Microbiology Recent Results (from the past 240 hour(s))  Resp Panel by RT-PCR (Flu A&B, Covid) Nasopharyngeal Swab     Status: None   Collection Time: 08/01/20  6:42 PM   Specimen: Nasopharyngeal Swab; Nasopharyngeal(NP) swabs in vial transport medium  Result Value Ref Range Status   SARS Coronavirus 2 by RT PCR NEGATIVE NEGATIVE Final    Comment: (NOTE) SARS-CoV-2 target nucleic acids are NOT DETECTED.  The SARS-CoV-2 RNA is generally detectable in upper respiratory specimens during the acute phase of infection. The lowest concentration of SARS-CoV-2 viral copies this assay can detect is 138 copies/mL. A negative result does not preclude SARS-Cov-2 infection and should not be used as the sole basis for treatment or other patient management decisions. A negative result may occur with  improper specimen collection/handling, submission of specimen other than nasopharyngeal swab, presence of viral mutation(s) within the areas targeted by this assay, and inadequate number of viral copies(<138 copies/mL). A negative result must be combined with clinical observations, patient history, and epidemiological information. The expected result is Negative.  Fact Sheet for Patients:  EntrepreneurPulse.com.au  Fact Sheet for Healthcare Providers:  IncredibleEmployment.be  This test is no t yet approved or cleared by the [place] FDA and  has been authorized for detection and/or diagnosis of SARS-CoV-2 by FDA under an Emergency Use Authorization (EUA). This EUA will remain  in effect (meaning this test can be used) for the duration of the COVID-19 declaration under  Section 564(b)(1) of the Act, 21 U.S.C.section 360bbb-3(b)(1), unless the authorization is terminated  or revoked sooner.       Influenza A by PCR NEGATIVE NEGATIVE Final   Influenza B by PCR NEGATIVE NEGATIVE Final    Comment: (NOTE) The Xpert Xpress SARS-CoV-2/FLU/RSV plus assay is intended as an aid in the diagnosis of influenza  from Nasopharyngeal swab specimens and should not be used as a sole basis for treatment. Nasal washings and aspirates are unacceptable for Xpert Xpress SARS-CoV-2/FLU/RSV testing.  Fact Sheet for Patients: EntrepreneurPulse.com.au  Fact Sheet for Healthcare Providers: IncredibleEmployment.be  This test is not yet approved or cleared by the [place] FDA and has been authorized for detection and/or diagnosis of SARS-CoV-2 by FDA under an Emergency Use Authorization (EUA). This EUA will remain in effect (meaning this test can be used) for the duration of the COVID-19 declaration under Section 564(b)(1) of the Act, 21 U.S.C. section 360bbb-3(b)(1), unless the authorization is terminated or revoked.  Performed at [facility], [place] [address] [place], [place] [address]   Surgical pcr screen     Status: None   Collection Time: 08/02/20 11:17 AM   Specimen: Nasal Mucosa; Nasal Swab  Result Value Ref Range Status   MRSA, PCR NEGATIVE NEGATIVE Final   Staphylococcus aureus NEGATIVE NEGATIVE Final    Comment: (NOTE) The Xpert SA Assay (FDA approved for NASAL specimens in patients 33 years of age and older), is one component of a comprehensive surveillance program. It is not intended to diagnose infection nor to guide or monitor treatment. Performed at [facility], [place] [address] [place], [place] [address]      Time coordinating discharge: 45 minutes  SIGNED:   Tawni Millers, MD  [facility] 08/05/2020, 11:03 AM

## 2020-08-05 NOTE — TOC Transition Note (Signed)
Transition of Care [facility]) - CM/SW Discharge Note   Patient Details  Name: [name]. MRN: [id number] Date of Birth: 08/22/1935  [facility] of Care [facility]) CM/SW Contact:  Lennart Pall, LCSW Phone Number: 08/05/2020, 12:49 PM   Clinical Narrative:    Pt medically cleared for dc today.  Orders placed for HHPT/RN and referral placed with [place].  No DME needs.  Ready for dc.   Final next level of care: [place] Barriers to Discharge: Barriers Resolved   Patient Goals and CMS Choice Patient states their goals for this hospitalization and ongoing recovery are:: return home      Discharge Placement                       Discharge Plan and Services In-house Referral: Clinical Social Work              DME Arranged: N/A DME Agency: NA       HH Arranged: PT, RN [place] Agency: [place] [facility]) Date HH Agency Contacted: 08/05/20 Time [place]: 1130 Representative spoke with at [place]: Ramond Marrow B.  Social Determinants of Health (SDOH) Interventions     Readmission Risk Interventions No flowsheet data found.

## 2020-08-05 NOTE — Plan of Care (Signed)

## 2020-08-05 NOTE — Progress Notes (Signed)
Provided discharge education/instructions, demonstrated foley care to Pt and his wife, all questions and concerns addressed, Pt not in distress, discharged home with belongings accompanied by wife.

## 2020-08-05 NOTE — Plan of Care (Signed)
  Problem: Safety: Goal: Ability to remain free from injury will improve Outcome: Progressing   Problem: Pain Management: Goal: Pain level will decrease with appropriate interventions Outcome: Progressing

## 2020-08-17 ENCOUNTER — Other Ambulatory Visit: Payer: Self-pay

## 2020-08-17 ENCOUNTER — Ambulatory Visit (INDEPENDENT_AMBULATORY_CARE_PROVIDER_SITE_OTHER): Payer: Medicare Other

## 2020-08-17 DIAGNOSIS — Z9889 Other specified postprocedural states: Secondary | ICD-10-CM

## 2020-08-17 DIAGNOSIS — I63411 Cerebral infarction due to embolism of right middle cerebral artery: Secondary | ICD-10-CM | POA: Diagnosis not present

## 2020-08-17 DIAGNOSIS — I4811 Longstanding persistent atrial fibrillation: Secondary | ICD-10-CM

## 2020-08-17 DIAGNOSIS — Z7901 Long term (current) use of anticoagulants: Secondary | ICD-10-CM | POA: Diagnosis not present

## 2020-08-17 LAB — POCT INR: INR: 1.5 — AB (ref 2.0–3.0)

## 2020-08-17 NOTE — Patient Instructions (Addendum)
Description   Take 1 1/2 tablets tonight and 1 1/2 tablet tomorrow night and then continue taking Warfarin 1 tablet daily except 1.5 tablets on Mondays. Continue your green leafy vegetable intake. Recheck INR in 7-10 days (normally 8 weeks).  Coumadin Clinic [phone] call with bleeding problems or new medications.

## 2020-08-26 ENCOUNTER — Ambulatory Visit (INDEPENDENT_AMBULATORY_CARE_PROVIDER_SITE_OTHER): Payer: Medicare Other | Admitting: *Deleted

## 2020-08-26 ENCOUNTER — Other Ambulatory Visit: Payer: Self-pay

## 2020-08-26 DIAGNOSIS — I63411 Cerebral infarction due to embolism of right middle cerebral artery: Secondary | ICD-10-CM

## 2020-08-26 DIAGNOSIS — Z9889 Other specified postprocedural states: Secondary | ICD-10-CM

## 2020-08-26 DIAGNOSIS — Z7901 Long term (current) use of anticoagulants: Secondary | ICD-10-CM

## 2020-08-26 DIAGNOSIS — Z5181 Encounter for therapeutic drug level monitoring: Secondary | ICD-10-CM

## 2020-08-26 DIAGNOSIS — I4811 Longstanding persistent atrial fibrillation: Secondary | ICD-10-CM

## 2020-08-26 LAB — POCT INR: INR: 1.9 — AB (ref 2.0–3.0)

## 2020-08-26 NOTE — Patient Instructions (Signed)
Description   Take warfarin 1.5 tablet today and then continue to take warfarin 1 tablet daily excpet for 1.5 tablets on Mondays. Recheck INR in 2 weeks. Coumadin Clinic [phone].

## 2020-08-27 ENCOUNTER — Other Ambulatory Visit: Payer: Self-pay

## 2020-08-27 ENCOUNTER — Encounter (HOSPITAL_COMMUNITY): Payer: Self-pay | Admitting: Emergency Medicine

## 2020-08-27 ENCOUNTER — Emergency Department (HOSPITAL_COMMUNITY)
Admission: EM | Admit: 2020-08-27 | Discharge: 2020-08-27 | Disposition: A | Discharge status: Home / Self Care | Payer: Medicare Other | Attending: Emergency Medicine | Admitting: Emergency Medicine

## 2020-08-27 DIAGNOSIS — I10 Essential (primary) hypertension: Secondary | ICD-10-CM | POA: Diagnosis not present

## 2020-08-27 DIAGNOSIS — N401 Enlarged prostate with lower urinary tract symptoms: Secondary | ICD-10-CM | POA: Diagnosis not present

## 2020-08-27 DIAGNOSIS — R339 Retention of urine, unspecified: Secondary | ICD-10-CM | POA: Insufficient documentation

## 2020-08-27 LAB — BASIC METABOLIC PANEL
Anion gap: 8 (ref 5–15)
BUN: 17 mg/dL (ref 8–23)
CO2: 23 mmol/L (ref 22–32)
Calcium: 9.2 mg/dL (ref 8.9–10.3)
Chloride: 106 mmol/L (ref 98–111)
Creatinine, Ser: 0.91 mg/dL (ref 0.61–1.24)
GFR, Estimated: >60 mL/min (ref >60)
Glucose, Bld: 114 mg/dL — ABNORMAL HIGH (ref 70–99)
Potassium: 3.8 mmol/L (ref 3.5–5.1)
Sodium: 137 mmol/L (ref 135–145)

## 2020-08-27 LAB — URINALYSIS, ROUTINE W REFLEX MICROSCOPIC
Bilirubin Urine: NEGATIVE
Glucose, UA: NEGATIVE mg/dL
Hgb urine dipstick: NEGATIVE
Ketones, ur: 5 mg/dL — AB
Leukocytes,Ua: NEGATIVE
Nitrite: NEGATIVE
Protein, ur: NEGATIVE mg/dL
Specific Gravity, Urine: 1.016 (ref 1.005–1.030)
pH: 5 (ref 5.0–8.0)

## 2020-08-27 NOTE — ED Notes (Signed)
Patient education provided on foley care to wife and patient.

## 2020-08-27 NOTE — ED Triage Notes (Signed)
Patient states he had a hip sx recently, had a foley placed and removed, has been having trouble draining his bladder since then. Straight cathed himself at home a few hours ago, only got a little urine. States he needs a foley.

## 2020-08-27 NOTE — Discharge Instructions (Addendum)
Follow-up with urology as planned on Tuesday

## 2020-08-27 NOTE — ED Provider Notes (Signed)
[facility] DEPT Provider Note   CSN: [id number] Arrival date & time: 08/27/20  1840     History Chief Complaint  Patient presents with   Urinary Retention    Gerald Amas. is a 85 y.o. male.  Patient states he is having difficulty urinating and has not been able to urinate for quite some time  The history is provided by the patient and medical records. No language interpreter was used.  Abdominal Pain Pain location:  Generalized Pain quality: aching   Pain radiates to:  Does not radiate Pain severity:  Mild Onset quality:  Sudden Timing:  Constant Progression:  Worsening Chronicity:  Recurrent Context: not alcohol use   Relieved by:  Nothing Associated symptoms: no chest pain, no cough, no diarrhea, no fatigue and no hematuria       Past Medical History:  Diagnosis Date   Acquired dilation of ascending aorta and aortic root (HCC)    79m aortic root and 466mascending aorta on echo 03/2020   Bladder stones    Borderline diabetes    BPH (benign prostatic hyperplasia)    Coronary artery disease    cardiologist-  dr skNat Matherhart NP--- per cath 06-02-2010 non-obstructive cad pLAD 30-40%   Diverticulosis of colon    Dysrhythmia    afib   Gout    Heart murmur    History of adenomatous polyp of colon    2002-- tubular adenoma   History of aortic insufficiency    severe -- s/p  AVR 08-03-2010   History of small bowel obstruction    02/ 2007 mechanical sbo s/p  surgical intervention;  partial sbo 09/ 2011 and 03-20-2011 resolved without surgical intervention   History of urinary retention    HTN (hypertension)    Peripheral neuropathy    Persistent atrial fibrillation (HCC)    S/P aortic valve replacement with prosthetic valve 08/03/2010   tissue valve   S/P Maze operation for atrial fibrillation 01/30/2018   Complete bilateral atrial lesion set using bipolar radiofrequency and cryothermy with clipping of LA appendage    S/P MVR (mitral valve repair) 01/30/2018   Complex valvuloplasty including artificial Gore-tex neochord placement x4 and [employer] medics Annuloflex ring annuloplasty, size 28   S/P patent foramen ovale closure 08/03/2010   at same time AVR   S/P tricuspid valve repair 01/30/2018   Using an MC3 Annuloplasty ring, size 28   [facility]   Tricuspid regurgitation     Patient Active Problem List   Diagnosis Date Noted   Malnutrition of moderate degree 08/02/2020   S/P left total hip arthroplasty 08/02/2020   Closed left [place] fracture, initial encounter ([place]20[place]   Chronic congestive heart failure ([facility]/19/20[place]   Elevated PSA 05/26/2020   Gastroduodenitis 05/26/2020   History of adenomatous polyp of colon 05/26/2020   History of anemia 05/26/2020   Hypercoagulable state ([facility]05/19/2022   Impairment of balance 05/26/2020   Peripheral neuropathy 05/26/2020   Personal history of other malignant neoplasm of skin 05/26/2020   Prediabetes 05/26/2020   Presence of prosthetic heart valve 05/26/2020   Sleep disorder 05/26/2020   Thrombocytopenia ([facility]05/19/2022   Vitamin D deficiency 05/26/2020   Acquired dilation of ascending aorta and aortic root (HCC)    Symptomatic anemia 09/05/2018   Anemia due to chronic blood loss 09/05/2018   GI bleed 09/05/2018   Long term current use of anticoagulant 02/10/2018   History of mitral valve repair 01/30/2018  S/P tricuspid valve repair 01/30/2018   S/P Maze operation for atrial fibrillation 01/30/2018   Tricuspid regurgitation    Mitral valve regurgitation 10/14/2017   Trochanteric bursitis of right hip 11/27/2016   History of cerebrovascular accident 11/27/2016   Persistent atrial fibrillation    Hyperglycemia    Right middle cerebral artery stroke ([place]) 0000000   Acute embolic stroke (HCC)    Hypokalemia    Left hemiparesis (HCC)    Dysphagia, post-stroke    S/P AVR (aortic valve replacement)    Benign essential HTN     Diabetes mellitus type 2 in nonobese [facility])    Benign prostatic hyperplasia with lower urinary tract symptoms    A-fib ([place]) 10/28/2016   CVA (cerebral vascular accident) ([place]) 10/24/2016   Essential hypertension    Gout    History of small bowel obstruction    History of BPH    S/P AVR 08/03/2010   Hypertrophy of prostate with urinary obstruction and other lower urinary tract symptoms (LUTS) 05/21/2001    Past Surgical History:  Procedure Laterality Date   BIOPSY  09/07/2018   Procedure: BIOPSY;  Surgeon: Otis Brace, MD;  [facility]: [facility];  Service: Gastroenterology;;   CARDIAC CATHETERIZATION  06-02-2010  dr Marlou Porch   non-obstructive cad- pLAD 30-40%/  normal LVSF/  severe AI   CARDIOVASCULAR STRESS TEST  04/12/2016   Low risk nuclear perfusion study w/ no significant reversible ischemia/  normal LV function and wall motion ,  stress ef 60%/  64m inferior and lateral scooped ST-segment depression w/ exercise (may be repolarization abnormality), exercise capacity was moderately reduced   CATARACT EXTRACTION W/ INTRAOCULAR LENS  IMPLANT, BILATERAL  02/2010   CLIPPING OF ATRIAL APPENDAGE N/A 01/30/2018   Procedure: CLIPPING OF LEFT ATRIAL APPENDAGE USING ATRICLIP PRO2 45MM;  Surgeon: ORexene Alberts MD;  [place]  Service: Open Heart Surgery;  Laterality: N/A;   COLONOSCOPY WITH PROPOFOL N/A 10/21/2018   Procedure: COLONOSCOPY WITH PROPOFOL;  Surgeon: BOtis Brace MD;  [facility];  Service: Gastroenterology;  Laterality: N/A;   CYSTOSCOPY WITH LITHOLAPAXY N/A 06/05/2016   Procedure: CYSTOSCOPY WITH LITHOLAPAXY and fulgarization of bladder neck;  Surgeon: WIrine Seal MD;  [facility]  Service: [facility];  Laterality: N/A;   ESOPHAGOGASTRODUODENOSCOPY (EGD) WITH PROPOFOL N/A 09/07/2018   Procedure: ESOPHAGOGASTRODUODENOSCOPY (EGD) WITH PROPOFOL;  Surgeon: BOtis Brace MD;  [facility];  Service: Gastroenterology;   Laterality: N/A;   EXPLORATORY LAPARTOMY /  CHOLECYSTECTOMY  02/28/2005   for Small  bowel obstruction (mechnical)   IR ANGIO EXTRACRAN SEL COM CAROTID INNOMINATE UNI L MOD SED  10/24/2016   IR ANGIO VERTEBRAL SEL SUBCLAVIAN INNOMINATE BILAT MOD SED  10/24/2016   IR PERCUTANEOUS ART THROMBECTOMY/INFUSION INTRACRANIAL INC DIAG ANGIO  10/24/2016   IR RADIOLOGIST EVAL & MGMT  12/05/2016   LEFT KNEE ARTHROSCOPY  2006   MAZE N/A 01/30/2018   Procedure: MAZE;  Surgeon: ORexene Alberts MD;  [place]  Service: Open Heart Surgery;  Laterality: N/A;   MITRAL VALVE REPAIR N/A 01/30/2018   Procedure: MITRAL VALVE REPAIR (MVR) USING CARBOMEDICS ANNULOFLEX SIZE 28;  Surgeon: ORexene Alberts MD;  [place] [facility]  Service: Open Heart Surgery;  Laterality: N/A;   POLYPECTOMY  10/21/2018   Procedure: POLYPECTOMY;  Surgeon: BOtis Brace MD;  [facility];  Service: Gastroenterology;;   RADIOLOGY WITH ANESTHESIA N/A 10/24/2016   Procedure: RADIOLOGY WITH ANESTHESIA;  Surgeon: DLuanne Bras MD;  [place] [facility]  Service: Radiology;  Laterality: N/A;   RIGHT FOOT SURGERY     RIGHT MINIATURE ANTERIOR THORACOTOMY FOR AORTIC VALVE REPLACEMENT AND CLOSURE PATENT FORAMEN OVALE  08-03-2010  DR Levada Schilling Magna-ease pericardial tissue valve (76m)   RIGHT/LEFT HEART CATH AND CORONARY ANGIOGRAPHY N/A 10/14/2017   Procedure: RIGHT/LEFT HEART CATH AND CORONARY ANGIOGRAPHY;  Surgeon: CSherren Mocha MD;  [facility];  Service: Cardiovascular;  Laterality: N/A;   TEE WITHOUT CARDIOVERSION N/A 10/14/2017   Procedure: TRANSESOPHAGEAL ECHOCARDIOGRAM (TEE);  Surgeon: NJosue Hector MD;  [facility];  Service: Cardiovascular;  Laterality: N/A;   TOTAL HIP ARTHROPLASTY Left 08/02/2020   Procedure: TOTAL HIP ARTHROPLASTY ANTERIOR APPROACH;  Surgeon: OParalee Cancel MD;  [facility];  Service: Orthopedics;  Laterality: Left;   TRANSTHORACIC ECHOCARDIOGRAM   05/30/2016  dr skains   moderate  LVH ef 60-65%/  bioprothesis aortic valve present ,normal grandient and no AI /  mild MV calcification , moderate MR /  mild PR/ moderate TR/  PASP 342mg/ (RA denisty was identified 04-27-2016 echo) and is seen again today, this is likely a promient eustacian ridge, atrium is normal size   TRICUSPID VALVE REPLACEMENT N/A 01/30/2018   Procedure: TRICUSPID VALVE REPAIR USING MC3 SIZE 28;  Surgeon: OwRexene AlbertsMD;  [facility] [facility] [place] Service: Open [facility] Surgery;  Laterality: N/A;       Family History  Problem Relation Age of Onset   Heart disease Mother    Brain cancer Father     Social History   Tobacco Use   Smoking status: Never   Smokeless tobacco: Never  Vaping Use   Vaping Use: Never used  Substance Use Topics   Alcohol use: Yes    Comment: ONE OR TWO PER MONTH   Drug use: No    Home Medications Prior to Admission medications   Medication Sig Start Date End Date Taking? Authorizing Provider  allopurinol (ZYLOPRIM) 300 MG tablet Take 150 mg by mouth every morning.    [provider]  amLODipine (NORVASC) 10 MG tablet Take 1 tablet (10 mg total) by mouth daily. 06/22/20 06/22/21  WeRichardson Dopp, PA-C  amoxicillin (AMOXIL) 500 MG capsule Take 2,000 mg by mouth See admin instructions. Take 4 capsules (2000 mg) by mouth 1 hour prior to dental appointment    [provider]  atenolol (TENORMIN) 25 MG tablet Take 12.5 mg by mouth every morning. 11/05/16   [provider]  b complex vitamins tablet Take 1 tablet by mouth every morning.    [provider]  cholecalciferol (VITAMIN D3) 25 MCG (1000 UNIT) tablet Take 1,000 Units by mouth every morning.    [provider]  Cinnamon 500 MG capsule Take 1,000 mg by mouth every morning.    [provider]  diclofenac Sodium (VOLTAREN) 1 % GEL Apply 1 application topically daily. For knee pain    [provider]  feeding supplement (ENSURE  ENLIVE / ENSURE PLUS) LIQD Take 237 mLs by mouth 2 (two) times daily between meals. 08/05/20 09/04/20  Arrien, MaJimmy PicketMD  ferrous sulfate 325 (65 FE) MG EC tablet Take 325 mg by mouth every morning. 10/29/19   [provider]  finasteride (PROSCAR) 5 MG tablet Take 1 tablet (5 mg total) by mouth daily. 11/05/16   Angiulli, DaLavon PaganiniPA-C  gabapentin (NEURONTIN) 100 MG capsule Take 100 mg by mouth daily with lunch. 05/03/20   [provider]  HYDROcodone-acetaminophen (NORCO/VICODIN)  5-325 MG tablet Take 1-2 tablets by mouth every 6 (six) hours as needed for severe pain. 08/04/20   Irving Copas, PA-C  lisinopril (ZESTRIL) 10 MG tablet Take 1 tablet (10 mg total) by mouth daily. 06/22/20 06/22/21  [name] T, PA-C  methocarbamol (ROBAXIN) 500 MG tablet Take 1 tablet (500 mg total) by mouth every 6 (six) hours as needed for muscle spasms. 08/04/20   Irving Copas, PA-C  pantoprazole (PROTONIX) 40 MG tablet Take 1 tablet (40 mg total) by mouth daily for 15 days. 08/05/20 08/20/20  Arrien, Jimmy Picket, MD  polyethylene glycol (MIRALAX / GLYCOLAX) 17 g packet Take 17 g by mouth daily as needed for mild constipation. 08/05/20   Arrien, Jimmy Picket, MD  tamsulosin [facility]) 0.4 MG CAPS capsule Take 2 capsules (0.8 mg total) by mouth daily after breakfast. 11/05/16   Angiulli, Lavon Paganini, PA-C  warfarin (COUMADIN) 5 MG tablet TAKE 1 TO 1 AND 1/2 TABLETS BY MOUTH DAILY OR AS  DIRECTED BY ANTICOAGULATION CLINIC 03/21/20   Jerline Pain, MD    Allergies    Sulfa antibiotics  Review of Systems   Review of Systems  Constitutional:  Negative for appetite change and fatigue.  HENT:  Negative for congestion, ear discharge and sinus pressure.   Eyes:  Negative for discharge.  Respiratory:  Negative for cough.   Cardiovascular:  Negative for chest pain.  Gastrointestinal:  Positive for abdominal pain. Negative for diarrhea.  Genitourinary:  Positive for difficulty urinating.  Negative for frequency and hematuria.  Musculoskeletal:  Negative for back pain.  Skin:  Negative for rash.  Neurological:  Negative for seizures and headaches.  Psychiatric/Behavioral:  Negative for hallucinations.    Physical Exam Updated Vital Signs BP 140/88 (BP Location: Left Arm)   Pulse 81   Temp 97.8 F (36.6 C) (Oral)   Resp 18   Ht 6' (1.829 m)   Wt 74.8 kg   SpO2 93%   BMI 22.38 kg/m   Physical Exam Vitals and nursing note reviewed.  Constitutional:      Appearance: He is well-developed.  HENT:     Head: Normocephalic.     Nose: Nose normal.  Eyes:     General: No scleral icterus.    Conjunctiva/sclera: Conjunctivae normal.  Neck:     Thyroid: No thyromegaly.  Cardiovascular:     Rate and Rhythm: Normal rate and regular rhythm.     Heart sounds: No murmur heard.   No friction rub. No gallop.  Pulmonary:     Breath sounds: No stridor. No wheezing or rales.  Chest:     Chest wall: No tenderness.  Abdominal:     General: There is no distension.     Tenderness: There is abdominal tenderness. There is no rebound.  Musculoskeletal:        General: Normal range of motion.     Cervical back: Neck supple.  Lymphadenopathy:     Cervical: No cervical adenopathy.  Skin:    Findings: No erythema or rash.  Neurological:     Mental Status: He is alert and oriented to person, place, and time.     Motor: No abnormal muscle tone.     Coordination: Coordination normal.  Psychiatric:        Behavior: Behavior normal.    ED Results / Procedures / Treatments   Labs (all labs ordered are listed, but only abnormal results are displayed) Labs Reviewed  URINALYSIS, ROUTINE W REFLEX MICROSCOPIC -  Abnormal; Notable for the following components:      Result Value   APPearance HAZY (*)    Ketones, ur 5 (*)    All other components within normal limits  BASIC METABOLIC PANEL - Abnormal; Notable for the following components:   Glucose, Bld 114 (*)    All other components  within normal limits  URINE CULTURE  I-STAT CHEM 8, ED    EKG None  Radiology No results found.  Procedures Procedures   Medications Ordered in ED Medications - No data to display  ED Course  I have reviewed the triage vital signs and the nursing notes.  Pertinent labs & imaging results that were available during my care of the patient were reviewed by me and considered in my medical decision making (see chart for details).    MDM Rules/Calculators/A&P                           Patient with urinary retention.  Patient given a Foley catheter and will follow up with urology Final Clinical Impression(s) / ED Diagnoses Final diagnoses:  Urinary retention    Rx / DC Orders ED Discharge Orders     None        Milton Ferguson, MD 08/29/20 1620

## 2020-08-29 LAB — URINE CULTURE: Culture: NO GROWTH

## 2020-09-08 ENCOUNTER — Other Ambulatory Visit: Payer: Self-pay | Admitting: *Deleted

## 2020-09-08 MED ORDER — ATENOLOL 25 MG PO TABS: 12.5000 mg | ORAL_TABLET | Freq: Every morning | ORAL | 1 refills | Status: DC

## 2020-09-14 ENCOUNTER — Ambulatory Visit (INDEPENDENT_AMBULATORY_CARE_PROVIDER_SITE_OTHER): Payer: Medicare Other

## 2020-09-14 ENCOUNTER — Other Ambulatory Visit: Payer: Self-pay

## 2020-09-14 DIAGNOSIS — I63411 Cerebral infarction due to embolism of right middle cerebral artery: Secondary | ICD-10-CM | POA: Diagnosis not present

## 2020-09-14 DIAGNOSIS — Z7901 Long term (current) use of anticoagulants: Secondary | ICD-10-CM | POA: Diagnosis not present

## 2020-09-14 DIAGNOSIS — Z9889 Other specified postprocedural states: Secondary | ICD-10-CM | POA: Diagnosis not present

## 2020-09-14 DIAGNOSIS — Z5181 Encounter for therapeutic drug level monitoring: Secondary | ICD-10-CM | POA: Diagnosis not present

## 2020-09-14 DIAGNOSIS — I4811 Longstanding persistent atrial fibrillation: Secondary | ICD-10-CM | POA: Diagnosis not present

## 2020-09-14 LAB — POCT INR: INR: 1.6 — AB (ref 2.0–3.0)

## 2020-09-14 NOTE — Patient Instructions (Signed)
Description   Take warfarin 1.5 tablet today and then START taking to take warfarin 1 tablet daily excpet for 1.5 tablets on Mondays and Thursdays. Recheck INR in 2 weeks. [facility] [phone].

## 2020-09-15 ENCOUNTER — Other Ambulatory Visit: Payer: Self-pay | Admitting: Urology

## 2020-09-16 NOTE — Telephone Encounter (Signed)
Filled in [place]

## 2020-09-28 ENCOUNTER — Other Ambulatory Visit: Payer: Self-pay

## 2020-09-28 ENCOUNTER — Ambulatory Visit (INDEPENDENT_AMBULATORY_CARE_PROVIDER_SITE_OTHER): Payer: Medicare Other | Admitting: *Deleted

## 2020-09-28 DIAGNOSIS — Z9889 Other specified postprocedural states: Secondary | ICD-10-CM

## 2020-09-28 DIAGNOSIS — Z7901 Long term (current) use of anticoagulants: Secondary | ICD-10-CM

## 2020-09-28 DIAGNOSIS — Z5181 Encounter for therapeutic drug level monitoring: Secondary | ICD-10-CM

## 2020-09-28 DIAGNOSIS — I63411 Cerebral infarction due to embolism of right middle cerebral artery: Secondary | ICD-10-CM | POA: Diagnosis not present

## 2020-09-28 DIAGNOSIS — I4811 Longstanding persistent atrial fibrillation: Secondary | ICD-10-CM | POA: Diagnosis not present

## 2020-09-28 LAB — POCT INR: INR: 1.8 — AB (ref 2.0–3.0)

## 2020-09-28 NOTE — Patient Instructions (Addendum)
Description   Take warfarin 1.5 tablets today and then START taking warfarin 1 tablet daily excpet for 1.5 tablets on Mondays and Thursdays. Recheck INR in 2 weeks. [facility] [phone].

## 2020-10-12 ENCOUNTER — Other Ambulatory Visit: Payer: Self-pay

## 2020-10-12 ENCOUNTER — Ambulatory Visit: Payer: Medicare Other | Admitting: *Deleted

## 2020-10-12 DIAGNOSIS — Z7901 Long term (current) use of anticoagulants: Secondary | ICD-10-CM | POA: Diagnosis not present

## 2020-10-12 DIAGNOSIS — Z9889 Other specified postprocedural states: Secondary | ICD-10-CM

## 2020-10-12 DIAGNOSIS — I63411 Cerebral infarction due to embolism of right middle cerebral artery: Secondary | ICD-10-CM

## 2020-10-12 DIAGNOSIS — Z5181 Encounter for therapeutic drug level monitoring: Secondary | ICD-10-CM

## 2020-10-12 DIAGNOSIS — I4811 Longstanding persistent atrial fibrillation: Secondary | ICD-10-CM | POA: Diagnosis not present

## 2020-10-12 LAB — POCT INR: INR: 3.2 — AB (ref 2.0–3.0)

## 2020-10-12 NOTE — Patient Instructions (Addendum)
Description   Hold warfarin today, then Continue taking warfarin 1 tablet daily excpet for 1.5 tablets on Mondays and Thursdays. Recheck INR in 3 weeks. [facility] [phone].

## 2020-11-02 ENCOUNTER — Ambulatory Visit (INDEPENDENT_AMBULATORY_CARE_PROVIDER_SITE_OTHER): Payer: Medicare Other

## 2020-11-02 ENCOUNTER — Other Ambulatory Visit: Payer: Self-pay

## 2020-11-02 DIAGNOSIS — I63411 Cerebral infarction due to embolism of right middle cerebral artery: Secondary | ICD-10-CM

## 2020-11-02 DIAGNOSIS — I4811 Longstanding persistent atrial fibrillation: Secondary | ICD-10-CM | POA: Diagnosis not present

## 2020-11-02 DIAGNOSIS — Z7901 Long term (current) use of anticoagulants: Secondary | ICD-10-CM | POA: Diagnosis not present

## 2020-11-02 DIAGNOSIS — Z9889 Other specified postprocedural states: Secondary | ICD-10-CM

## 2020-11-02 LAB — POCT INR: INR: 2.6 (ref 2.0–3.0)

## 2020-11-02 NOTE — Patient Instructions (Signed)
Description   Continue taking warfarin 1 tablet daily excpet for 1.5 tablets on Mondays and Thursdays. Recheck INR in 5 weeks. [facility] [phone].

## 2020-11-09 ENCOUNTER — Other Ambulatory Visit: Payer: Self-pay | Admitting: Urology

## 2020-12-05 ENCOUNTER — Other Ambulatory Visit: Payer: Self-pay | Admitting: *Deleted

## 2020-12-05 DIAGNOSIS — D500 Iron deficiency anemia secondary to blood loss (chronic): Secondary | ICD-10-CM

## 2020-12-05 NOTE — Progress Notes (Signed)
Called and spoke with [name] regarding referral from Marda Stalker PA-C for anemia. Gerald. Burget states that he is feeling better since increasing Iron and would like time for that to increase Hgb prior to being evaluated by Hematologist. Lab and New patient appt scheduled for 01/17/21 per patient request

## 2020-12-07 ENCOUNTER — Other Ambulatory Visit: Payer: Self-pay

## 2020-12-07 ENCOUNTER — Ambulatory Visit: Payer: Medicare Other

## 2020-12-07 DIAGNOSIS — I4811 Longstanding persistent atrial fibrillation: Secondary | ICD-10-CM | POA: Diagnosis not present

## 2020-12-07 DIAGNOSIS — Z7901 Long term (current) use of anticoagulants: Secondary | ICD-10-CM

## 2020-12-07 DIAGNOSIS — I63411 Cerebral infarction due to embolism of right middle cerebral artery: Secondary | ICD-10-CM | POA: Diagnosis not present

## 2020-12-07 DIAGNOSIS — Z9889 Other specified postprocedural states: Secondary | ICD-10-CM | POA: Diagnosis not present

## 2020-12-07 LAB — POCT INR: INR: 3.2 — AB (ref 2.0–3.0)

## 2020-12-07 NOTE — Patient Instructions (Signed)
Description   Start taking warfarin 1 tablet daily except for 1.5 tablets on Mondays. Recheck INR in 2 weeks. [facility] [phone].

## 2020-12-19 ENCOUNTER — Encounter: Payer: Self-pay | Admitting: Physician Assistant

## 2020-12-19 DIAGNOSIS — I2510 Atherosclerotic heart disease of native coronary artery without angina pectoris: Secondary | ICD-10-CM | POA: Insufficient documentation

## 2020-12-19 HISTORY — DX: Atherosclerotic heart disease of native coronary artery without angina pectoris: I25.10

## 2020-12-19 NOTE — Progress Notes (Signed)
Cardiology Office Note:    Date:  12/20/2020   ID:  Gerald Amas., DOB [birth year], MRN [id number]  PCP:  Gerald Stalker, PA-C   [facility] Providers Cardiologist:  Gerald Furbish, MD Cardiology APP:  [name]     Referring MD: Gerald Stalker, PA-C   Chief Complaint:  F/u for AFib, Valvular Heart Disease    Patient Profile:   [name]. is a 85 y.o. male with:  Aortic insufficiency S/p bioprosthetic AVR and Patent Foramen Ovale closure in 2012 Severe MR, severe TR S/p MV Repair, TV Repair, MAZE, LA clipping in 01/2018 Hypertension Atenolol DCd in 3/22 due to ? HR but resumed due to uncontrolled BP Permanent atrial fibrillation/flutter Chronic anticoagulation w Warfarin Hx of R MCA CVA tx with cath based Rx Coronary artery disease Non-obstructive by cath in 10/19 Dilated thoracic aorta Echocardiogram 3/22:  root 43 mm, ascending aorta 41 mm Hx of GI bleed Borderline diabetes mellitus BPH Peripheral neuropathy L hip fracture due to mechanical fall 7/22 >> s/p L THR  History of Present Illness: [name] was last seen in 6/22.  He fell in July and fractured his L hip requiring total hip replacement.  He returns for Cardiology f/u.  He is here alone.  Overall, he is doing well.  He has not had chest pain, shortness of breath, syncope, leg edema.  His hip is feeling better and he is getting around well now.      ASSESSMENT & PLAN:   Anemia due to chronic blood loss His Hgb has been low since his hip surgery.  He notes he will see a hematologist soon.  He would like to get his Hgb rechecked.  Check CBC today.   CAD (coronary artery disease) Non-obstructive CAD in 2019.  He is not having angina.  He is not on ASA as he is on Warfarin.  Acquired dilation of ascending aorta and aortic root (HCC) Echocardiogram in 3/22 with aortic root 43 mm and ascending aorta 41 mm.  Repeat Echocardiogram in 03/2021.  If root or ascending aorta is larger,  plan chest/aorta CTA.    Essential hypertension BP has been running optimal at home.  It is higher here today.  I have asked him to monitor his BP at home and send me readings in 2 weeks.  If his BP is above target, increase Lisinopril.  Obtain follow-up CMET today.  Permanent atrial fibrillation (HCC) Rate seems to be controlled.  Continue Warfarin.  He sees our Coumadin Clinic today for f/u.  Obtain f/u CBC.    S/P AVR S/p bioprosthetic AVR in 2012.  Normally functioning bioprosthetic AVR by echocardiogram in March 2022.  He will have a follow-up echocardiogram in March 2023.  Continue SBE prophylaxis.  s/p mitral valve repair Normally functioning mitral valve repair and tricuspid valve repair by echocardiogram March 2022.  Follow-up echo will be obtained March 2023.  Continue SBE prophylaxis.          Dispo:  Return in about 6 months (around 06/20/2021) for Routine follow up in 6 months with Gerald Dopp, PA-C.Marland Kitchen    Prior CV studies: Long Term Monitor 3-7 days 04/14/20 Artifact - unable to determine rhythm Avg HR 66 151 Supraventricular Tachycardia runs (avg 150) 2nd degree AVB type 1 noted during sleep PACs 6.4%, PVCs rare Poss AFib/flutter   Echocardiogram 03/16/20 EF 60-65, no RWMA, mod LVH, GLS -20.3%, normal RVSF, RVSP 35.3, severe BAE, normally functioning MV repair (mean 6  mmHg, mild MR), TV repair ok with mean 2 mmHg, AVR ok with mean 16 mmHg, DI 0.5, aortic root 43 mm, ascending aorta 41 mm, s/p PFO closure - no residual shunt   Carotid US 01/20/2018 R ICA without stenosis; L 1-39  Cardiac catheterization 10/14/17 LCx prox to mid 30 LAD ost 30, prox 40     Past Medical History:  Diagnosis Date   Acquired dilation of ascending aorta and aortic root (HCC)    73m aortic root and 481mascending aorta on echo 03/2020   Bladder stones    Borderline diabetes    BPH (benign prostatic hyperplasia)    CAD (coronary artery disease) 12/19/2020   Non-obstructive coronary artery  disease at cath in 2019   Coronary artery disease    cardiologist-  dr skMarlou PorchloCecille Rubinerhart NP--- per cath 06-02-2010 non-obstructive cad pLAD 30-40%   Diverticulosis of colon    Dysrhythmia    afib   Gout    Heart murmur    History of adenomatous polyp of colon    2002-- tubular adenoma   History of aortic insufficiency    severe -- s/p  AVR 08-03-2010   History of small bowel obstruction    02/ 2007 mechanical sbo s/p  surgical intervention;  partial sbo 09/ 2011 and 03-20-2011 resolved without surgical intervention   History of urinary retention    HTN (hypertension)    Peripheral neuropathy    Persistent atrial fibrillation (HCC)    S/P aortic valve replacement with prosthetic valve 08/03/2010   tissue valve   S/P Maze operation for atrial fibrillation 01/30/2018   Complete bilateral atrial lesion set using bipolar radiofrequency and cryothermy with clipping of LA appendage   S/P MVR (mitral valve repair) 01/30/2018   Complex valvuloplasty including artificial Gore-tex neochord placement x4 and Carbo medics Annuloflex ring annuloplasty, size 28   S/P patent foramen ovale closure 08/03/2010   at same time AVR   S/P tricuspid valve repair 01/30/2018   Using an MC3 Annuloplasty ring, size 28   [place]   Tricuspid regurgitation    Current Medications: Current Meds  Medication Sig   allopurinol (ZYLOPRIM) 300 MG tablet Take 150 mg by mouth every morning.   amLODipine (NORVASC) 10 MG tablet Take 1 tablet (10 mg total) by mouth daily.   amoxicillin (AMOXIL) 500 MG capsule Take 2,000 mg by mouth See admin instructions. Take 4 capsules (2000 mg) by mouth 1 hour prior to dental appointment   atenolol (TENORMIN) 25 MG tablet Take 0.5 tablets (12.5 mg total) by mouth every morning.   b complex vitamins tablet Take 1 tablet by mouth every morning.   cholecalciferol (VITAMIN D3) 25 MCG (1000 UNIT) tablet Take 1,000 Units by mouth every morning.   Cinnamon 500 MG capsule Take 500 mg  by mouth every morning.   ferrous sulfate 325 (65 FE) MG EC tablet Take 2 tablets by mouth every morning.   finasteride (PROSCAR) 5 MG tablet Take 1 tablet (5 mg total) by mouth daily.   gabapentin (NEURONTIN) 100 MG capsule Take 100 mg by mouth daily with lunch.   lisinopril (ZESTRIL) 10 MG tablet Take 1 tablet (10 mg total) by mouth daily.   tamsulosin (FLOMAX) 0.4 MG CAPS capsule Take 2 capsules (0.8 mg total) by mouth daily after breakfast.   warfarin (COUMADIN) 5 MG tablet TAKE 1 TO 1 AND 1/2 TABLETS BY MOUTH DAILY OR AS  DIRECTED BY ANTICOAGULATION CLINIC    Allergies:  Sulfa antibiotics and Lisinopril   Social History   Tobacco Use   Smoking status: Never   Smokeless tobacco: Never  Vaping Use   Vaping Use: Never used  Substance Use Topics   Alcohol use: Yes    Comment: ONE OR TWO PER MONTH   Drug use: No    Family Hx: The patient's family history includes Brain cancer in his father; Heart disease in his mother.  Review of Systems  Gastrointestinal:  Negative for hematochezia.  Genitourinary:  Negative for hematuria.    EKGs/Labs/Other Test Reviewed:    EKG:  EKG is not ordered today.  The ekg ordered today demonstrates n/a  Recent Labs: 08/01/2020: ALT 17 08/05/2020: Hemoglobin 8.5; Platelets 121 08/27/2020: BUN 17; Creatinine, Ser 0.91; Potassium 3.8; Sodium 137   Recent Lipid Panel Lab Results  Component Value Date/Time   CHOL 146 11/25/2019 10:40 AM   TRIG 128 11/25/2019 10:40 AM   HDL 38 (L) 11/25/2019 10:40 AM   LDLCALC 85 11/25/2019 10:40 AM     Risk Assessment/Calculations:    CHA2DS2-VASc Score = 6   This indicates a 9.7% annual risk of stroke. The patient's score is based upon: CHF History: 0 HTN History: 1 Diabetes History: 0 Stroke History: 2 Vascular Disease History: 1 Age Score: 2 Gender Score: 0         Physical Exam:    VS:  BP (!) 142/74   Pulse 74   Ht 6' (1.829 m)   Wt 182 lb 9.6 oz (82.8 kg)   SpO2 98%   BMI 24.77 kg/m      Wt Readings from Last 3 Encounters:  12/20/20 182 lb 9.6 oz (82.8 kg)  08/27/20 165 lb (74.8 kg)  08/05/20 188 lb 15 oz (85.7 kg)    Constitutional:      Appearance: Healthy appearance. Not in distress.  Neck:     Vascular: No JVR.  Pulmonary:     Effort: Pulmonary effort is normal.     Breath sounds: No wheezing. No rales.  Cardiovascular:     Normal rate. Regular rhythm. Normal S1. Normal S2.      Murmurs: There is a grade 3/6 holosystolic murmur at the URSB and ULSB.  Edema:    Peripheral edema absent.  Abdominal:     Palpations: Abdomen is soft.  Skin:    General: Skin is warm and dry.  Neurological:     General: No focal deficit present.     Mental Status: Alert and oriented to person, place and time.     Cranial Nerves: Cranial nerves are intact.       Medication Adjustments/Labs and Tests Ordered: Current medicines are reviewed at length with the patient today.  Concerns regarding medicines are outlined above.  Tests Ordered: Orders Placed This Encounter  Procedures   Comp Met (CMET)   CBC   Medication Changes: No orders of the defined types were placed in this encounter.  Signed, Gerald Dopp, PA-C  12/20/2020 10:06 AM    [facility] [address], [place], [place]  [address] Phone: [phone]; Fax: [phone]

## 2020-12-20 ENCOUNTER — Ambulatory Visit (INDEPENDENT_AMBULATORY_CARE_PROVIDER_SITE_OTHER): Payer: Medicare Other | Admitting: *Deleted

## 2020-12-20 ENCOUNTER — Ambulatory Visit: Payer: Medicare Other | Admitting: Physician Assistant

## 2020-12-20 ENCOUNTER — Other Ambulatory Visit: Payer: Self-pay

## 2020-12-20 ENCOUNTER — Encounter: Payer: Self-pay | Admitting: Physician Assistant

## 2020-12-20 VITALS — BP 142/74 | HR 74 | Ht 72.0 in | Wt 182.6 lb

## 2020-12-20 DIAGNOSIS — Z952 Presence of prosthetic heart valve: Secondary | ICD-10-CM

## 2020-12-20 DIAGNOSIS — I63411 Cerebral infarction due to embolism of right middle cerebral artery: Secondary | ICD-10-CM | POA: Diagnosis not present

## 2020-12-20 DIAGNOSIS — Z7901 Long term (current) use of anticoagulants: Secondary | ICD-10-CM | POA: Diagnosis not present

## 2020-12-20 DIAGNOSIS — Z9889 Other specified postprocedural states: Secondary | ICD-10-CM

## 2020-12-20 DIAGNOSIS — I4821 Permanent atrial fibrillation: Secondary | ICD-10-CM

## 2020-12-20 DIAGNOSIS — I2510 Atherosclerotic heart disease of native coronary artery without angina pectoris: Secondary | ICD-10-CM

## 2020-12-20 DIAGNOSIS — I10 Essential (primary) hypertension: Secondary | ICD-10-CM

## 2020-12-20 DIAGNOSIS — D500 Iron deficiency anemia secondary to blood loss (chronic): Secondary | ICD-10-CM

## 2020-12-20 DIAGNOSIS — I4811 Longstanding persistent atrial fibrillation: Secondary | ICD-10-CM | POA: Diagnosis not present

## 2020-12-20 DIAGNOSIS — I77819 Aortic ectasia, unspecified site: Secondary | ICD-10-CM

## 2020-12-20 LAB — CBC
Hematocrit: 36 % — ABNORMAL LOW (ref 37.5–51.0)
Hemoglobin: 12 g/dL — ABNORMAL LOW (ref 13.0–17.7)
MCH: 30.8 pg (ref 26.6–33.0)
MCHC: 33.3 g/dL (ref 31.5–35.7)
MCV: 93 fL (ref 79–97)
Platelets: 173 10*3/uL (ref 150–450)
RBC: 3.89 x10E6/uL — ABNORMAL LOW (ref 4.14–5.80)
RDW: 14 % (ref 11.6–15.4)
WBC: 5.9 10*3/uL (ref 3.4–10.8)

## 2020-12-20 LAB — COMPREHENSIVE METABOLIC PANEL
ALT: 13 IU/L (ref 0–44)
AST: 23 IU/L (ref 0–40)
Albumin/Globulin Ratio: 1.6 (ref 1.2–2.2)
Albumin: 4.4 g/dL (ref 3.6–4.6)
Alkaline Phosphatase: 76 IU/L (ref 44–121)
BUN/Creatinine Ratio: 17 (ref 10–24)
BUN: 18 mg/dL (ref 8–27)
Bilirubin Total: 0.5 mg/dL (ref 0.0–1.2)
CO2: 24 mmol/L (ref 20–29)
Calcium: 9.5 mg/dL (ref 8.6–10.2)
Chloride: 106 mmol/L (ref 96–106)
Creatinine, Ser: 1.03 mg/dL (ref 0.76–1.27)
Globulin, Total: 2.8 g/dL (ref 1.5–4.5)
Glucose: 103 mg/dL — ABNORMAL HIGH (ref 70–99)
Potassium: 4.6 mmol/L (ref 3.5–5.2)
Sodium: 143 mmol/L (ref 134–144)
Total Protein: 7.2 g/dL (ref 6.0–8.5)
eGFR: 72 mL/min/{1.73_m2} (ref >59)

## 2020-12-20 LAB — POCT INR: INR: 1.8 — AB (ref 2.0–3.0)

## 2020-12-20 NOTE — Assessment & Plan Note (Signed)
Rate seems to be controlled.  Continue Warfarin.  He sees our Coumadin Clinic today for f/u.  Obtain f/u CBC.

## 2020-12-20 NOTE — Assessment & Plan Note (Addendum)
BP has been running optimal at home.  It is higher here today.  I have asked him to monitor his BP at home and send me readings in 2 weeks.  If his BP is above target, increase Lisinopril.  Obtain follow-up CMET today.

## 2020-12-20 NOTE — Assessment & Plan Note (Signed)
Echocardiogram in 3/22 with aortic root 43 mm and ascending aorta 41 mm.  Repeat Echocardiogram in 03/2021.  If root or ascending aorta is larger, plan chest/aorta CTA.

## 2020-12-20 NOTE — Assessment & Plan Note (Signed)
His Hgb has been low since his hip surgery.  He notes he will see a hematologist soon.  He would like to get his Hgb rechecked.  Check CBC today.

## 2020-12-20 NOTE — Assessment & Plan Note (Signed)
S/p bioprosthetic AVR in 2012.  Normally functioning bioprosthetic AVR by echocardiogram in March 2022.  He will have a follow-up echocardiogram in March 2023.  Continue SBE prophylaxis.

## 2020-12-20 NOTE — Assessment & Plan Note (Signed)
Non-obstructive CAD in 2019.  He is not having angina.  He is not on ASA as he is on Warfarin.

## 2020-12-20 NOTE — Patient Instructions (Signed)
Description   Today take 1.5 tablets of Warfarin then continue taking warfarin 1 tablet daily except for 1.5 tablets on Mondays. Recheck INR in 3 weeks per request. Coumadin Clinic [phone].

## 2020-12-20 NOTE — Assessment & Plan Note (Signed)
Normally functioning mitral valve repair and tricuspid valve repair by echocardiogram March 2022.  Follow-up echo will be obtained March 2023.  Continue SBE prophylaxis.

## 2020-12-20 NOTE — Patient Instructions (Signed)
Medication Instructions:   Your physician recommends that you continue on your current medications as directed. Please refer to the Current Medication list given to you today.  *If you need a refill on your cardiac medications before your next appointment, please call your pharmacy*   Lab Work:  TODAY!!!!!! CMET/CBC  If you have labs (blood work) drawn today and your tests are completely normal, you will receive your results only by: [place] (if you have MyChart) OR A paper copy in the mail If you have any lab test that is abnormal or we need to change your treatment, we will call you to review the results.   Testing/Procedures:  Your physician has requested that you have an echocardiogram. Echocardiography is a painless test that uses sound waves to create images of your heart. It provides your doctor with information about the size and shape of your heart and how well your hearts chambers and valves are working. This procedure takes approximately one hour. There are no restrictions for this procedure.     Follow-Up: At [facility], you and your health needs are our priority.  As part of our continuing mission to provide you with exceptional heart care, we have created designated Provider Care Teams.  These Care Teams include your primary Cardiologist (physician) and Advanced Practice Providers (APPs -  Physician Assistants and Nurse Practitioners) who all work together to provide you with the care you need, when you need it.  We recommend signing up for the patient portal called "MyChart".  Sign up information is provided on this After Visit Summary.  MyChart is used to connect with patients for Virtual Visits (Telemedicine).  Patients are able to view lab/test results, encounter notes, upcoming appointments, etc.  Non-urgent messages can be sent to your provider as well.   To learn more about what you can do with MyChart, go to [email or web address].    Your next  appointment:   6 month(s)  The format for your next appointment:   In Person  Provider:   [name], PA-C     Other Instructions  Please send in bp readings either mychart or you can call at [phone].   Blood Pressure Record Sheet To take your blood pressure, you will need a blood pressure machine. You can buy a blood pressure machine (blood pressure monitor) at your clinic, drug store, or online. When choosing one, consider: An automatic monitor that has an arm cuff. A cuff that wraps snugly around your upper arm. You should be able to fit only one finger between your arm and the cuff. A device that stores blood pressure reading results. Do not choose a monitor that measures your blood pressure from your wrist or finger. Follow your health care provider's instructions for how to take your blood pressure. To use this form: Get one reading in the morning (a.m.) before you take any medicines. Get one reading in the evening (p.m.) before supper. Take at least 2 readings with each blood pressure check. This makes sure the results are correct. Wait 1-2 minutes between measurements. Write down the results in the spaces on this form. Repeat this once a week, or as told by your health care provider. Make a follow-up appointment with your health care provider to discuss the results. Blood pressure log Date: _______________________ a.m. _____________________(1st reading) _____________________(2nd reading) p.m. _____________________(1st reading) _____________________(2nd reading) Date: _______________________ a.m. _____________________(1st reading) _____________________(2nd reading) p.m. _____________________(1st reading) _____________________(2nd reading) Date: _______________________ a.m. _____________________(1st reading) _____________________(2nd reading) p.m. _____________________(1st reading) _____________________(2nd  reading) Date: _______________________ a.m.  _____________________(1st reading) _____________________(2nd reading) p.m. _____________________(1st reading) _____________________(2nd reading) Date: _______________________ a.m. _____________________(1st reading) _____________________(2nd reading) p.m. _____________________(1st reading) _____________________(2nd reading) This information is not intended to replace advice given to you by your health care provider. Make sure you discuss any questions you have with your health care provider. Document Revised: 04/14/2019 Document Reviewed: 04/15/2019 Elsevier Patient Education  [place].

## 2020-12-21 ENCOUNTER — Telehealth: Payer: Self-pay | Admitting: Physician Assistant

## 2020-12-21 NOTE — Telephone Encounter (Signed)
Called pt back to let pt know I have not received lab results. Pt also calling in  to let Scott know bp is coming down 126/69 to a reasonable number. Will send to [place] to [place].

## 2020-12-21 NOTE — Telephone Encounter (Signed)
That's great!  BP is normal. See result note. [name], PA-C    12/21/2020 3:07 PM

## 2020-12-21 NOTE — Telephone Encounter (Signed)
Pt is returning call for lab results  

## 2021-01-10 ENCOUNTER — Ambulatory Visit: Payer: Medicare Other

## 2021-01-10 ENCOUNTER — Other Ambulatory Visit: Payer: Self-pay

## 2021-01-10 DIAGNOSIS — I4811 Longstanding persistent atrial fibrillation: Secondary | ICD-10-CM

## 2021-01-10 DIAGNOSIS — I63411 Cerebral infarction due to embolism of right middle cerebral artery: Secondary | ICD-10-CM

## 2021-01-10 DIAGNOSIS — Z7901 Long term (current) use of anticoagulants: Secondary | ICD-10-CM

## 2021-01-10 DIAGNOSIS — Z9889 Other specified postprocedural states: Secondary | ICD-10-CM

## 2021-01-10 LAB — POCT INR: INR: 2.6 (ref 2.0–3.0)

## 2021-01-10 NOTE — Patient Instructions (Signed)
Description   Continue taking warfarin 1 tablet daily except for 1.5 tablets on Mondays. Recheck INR in 5 weeks per request. Coumadin Clinic [phone].

## 2021-01-16 NOTE — Progress Notes (Signed)
New Hematology/Oncology Consult   Requesting MD: Marda Stalker, [place]  Reason for Consult: Anemia  HPI: [name] is an 86 year old man referred for evaluation of anemia.  He was seen by Marda Stalker, PA in follow-up on 11/28/2020.  Her note indicates he has seen GI with no source of the anemia identified.  Labs done 11/25/2020 showed hemoglobin 11.6, MCV 93, white count 4.8, platelet count 159,000.  This compares to a CBC from 11/03/2020 at which time the hemoglobin was 11.6, MCV 94, white count 6.0, platelet count 155,000; 09/13/2020 hemoglobin 11.1, MCV 97.8 (80-94), serum iron 62 (50-212), transferrin 185 (203-362), saturation 24% (20-55%), TIBC 258 (250-450); 05/17/2020-hemoglobin 13, MCV 98 (80-94), white count 7.9, platelet 132,000 (150-400).  Review of labs in the EMR-he was hospitalized in July of this year with a left hip fracture and underwent a total hip replacement.  On admission hemoglobin was 13.1.  At discharge hemoglobin was 8.5.  He was hospitalized in August 2020 with symptomatic anemia, ferritin low at 19 (24-336), stool positive for occult blood.  EGD 09/07/2018-esophageal plaques were found suspicious for candidiasis, gastritis with hemorrhage.  Colonoscopy 10/21/2018-examined portion of the ileum normal; 1 3 mm polyp in the ascending colon (benign polypoid colorectal type mucosa, no evidence of malignancy); diverticulosis left colon, internal hemorrhoids.  Past medical history significant for CAD, aortic insufficiency status post AVR 2012, hypertension, atrial fibrillation.  He is maintained on chronic Coumadin anticoagulation.   Past Medical History:  Diagnosis Date   Acquired dilation of ascending aorta and aortic root (HCC)    30mm aortic root and 80mm ascending aorta on echo 03/2020   Bladder stones    Borderline diabetes    BPH (benign prostatic hyperplasia)    CAD (coronary artery disease) 12/19/2020   Non-obstructive coronary artery disease at cath  in 2019   Coronary artery disease    cardiologist-  dr Marlou Porch  Cecille Rubin gerhart NP--- per cath 06-02-2010 non-obstructive cad pLAD 30-40%   Diverticulosis of colon    Dysrhythmia    afib   Gout    Heart murmur    History of adenomatous polyp of colon    2002-- tubular adenoma   History of aortic insufficiency    severe -- s/p  AVR 08-03-2010   History of small bowel obstruction    02/ 2007 mechanical sbo s/p  surgical intervention;  partial sbo 09/ 2011 and 03-20-2011 resolved without surgical intervention   History of urinary retention    HTN (hypertension)    Peripheral neuropathy    Persistent atrial fibrillation (HCC)    S/P aortic valve replacement with prosthetic valve 08/03/2010   tissue valve   S/P Maze operation for atrial fibrillation 01/30/2018   Complete bilateral atrial lesion set using bipolar radiofrequency and cryothermy with clipping of LA appendage   S/P MVR (mitral valve repair) 01/30/2018   Complex valvuloplasty including artificial Gore-tex neochord placement x4 and Car[employer] medics Annuloflex ring annuloplasty, size 28   S/P patent foramen ovale closure 08/03/2010   at same time AVR   S/P tricuspid valve repair 01/30/2018   Using an MC3 Annuloplasty ring, size 28   Stroke [facility])    Tricuspid regurgitation      Past Surgical History:  Procedure Laterality Date   BIOPSY  09/07/2018   Procedure: BIOPSY;  Surgeon: Otis Brace, MD;  Location: [facility];  Service: Gastroenterology;;   CARDIAC CATHETERIZATION  06-02-2010  dr skains   non-obstructive cad- pLAD 30-40%/  normal LVSF/  severe  AI   CARDIOVASCULAR STRESS TEST  04/12/2016   Low risk nuclear perfusion study w/ no significant reversible ischemia/  normal LV function and wall motion ,  stress ef 60%/  26mm inferior and lateral scooped ST-segment depression w/ exercise (may be repolarization abnormality), exercise capacity was moderately reduced   CATARACT EXTRACTION W/ INTRAOCULAR LENS  IMPLANT, BILATERAL   02/2010   CLIPPING OF ATRIAL APPENDAGE N/A 01/30/2018   Procedure: CLIPPING OF LEFT ATRIAL APPENDAGE USING ATRICLIP PRO2 45MM;  Surgeon: Rexene Alberts, MD;  Location: [place];  Service: Open Heart Surgery;  Laterality: N/A;   COLONOSCOPY WITH PROPOFOL N/A 10/21/2018   Procedure: COLONOSCOPY WITH PROPOFOL;  Surgeon: Otis Brace, MD;  Location: [facility];  Service: Gastroenterology;  Laterality: N/A;   CYSTOSCOPY WITH LITHOLAPAXY N/A 06/05/2016   Procedure: CYSTOSCOPY WITH LITHOLAPAXY and fulgarization of bladder neck;  Surgeon: Irine Seal, MD;  Location: [facility];  Service: Urology;  Laterality: N/A;   ESOPHAGOGASTRODUODENOSCOPY (EGD) WITH PROPOFOL N/A 09/07/2018   Procedure: ESOPHAGOGASTRODUODENOSCOPY (EGD) WITH PROPOFOL;  Surgeon: Otis Brace, MD;  Location: [facility];  Service: Gastroenterology;  Laterality: N/A;   EXPLORATORY LAPARTOMY /  CHOLECYSTECTOMY  02/28/2005   for Small  bowel obstruction (mechnical)   IR ANGIO EXTRACRAN SEL COM CAROTID INNOMINATE UNI L MOD SED  10/24/2016   IR ANGIO VERTEBRAL SEL SUBCLAVIAN INNOMINATE BILAT MOD SED  10/24/2016   IR PERCUTANEOUS ART THROMBECTOMY/INFUSION INTRACRANIAL INC DIAG ANGIO  10/24/2016   IR RADIOLOGIST EVAL & MGMT  12/05/2016   LEFT KNEE ARTHROSCOPY  2006   MAZE N/A 01/30/2018   Procedure: MAZE;  Surgeon: Rexene Alberts, MD;  Location: [place];  Service: Open Heart Surgery;  Laterality: N/A;   MITRAL VALVE REPAIR N/A 01/30/2018   Procedure: MITRAL VALVE REPAIR (MVR) USING CARBOMEDICS ANNULOFLEX SIZE 28;  Surgeon: Rexene Alberts, MD;  Location: [place];  Service: Open Heart Surgery;  Laterality: N/A;   POLYPECTOMY  10/21/2018   Procedure: POLYPECTOMY;  Surgeon: Otis Brace, MD;  Location: [facility];  Service: Gastroenterology;;   RADIOLOGY WITH ANESTHESIA N/A 10/24/2016   Procedure: RADIOLOGY WITH ANESTHESIA;  Surgeon: Luanne Bras, MD;  Location: [place];  Service: Radiology;  Laterality: N/A;    RIGHT FOOT SURGERY     RIGHT MINIATURE ANTERIOR THORACOTOMY FOR AORTIC VALVE REPLACEMENT AND CLOSURE PATENT FORAMEN OVALE  08-03-2010  DR Levada Schilling Magna-ease pericardial tissue valve (40mm)   RIGHT/LEFT HEART CATH AND CORONARY ANGIOGRAPHY N/A 10/14/2017   Procedure: RIGHT/LEFT HEART CATH AND CORONARY ANGIOGRAPHY;  Surgeon: [name], MD;  Location: [place] [facility];  Service: Cardiovascular;  Laterality: N/A;   TEE WITHOUT CARDIOVERSION N/A 10/14/2017   Procedure: TRANSESOPHAGEAL ECHOCARDIOGRAM (TEE);  Surgeon: Josue Hector, MD;  Location: [facility];  Service: Cardiovascular;  Laterality: N/A;   TOTAL HIP ARTHROPLASTY Left 08/02/2020   Procedure: TOTAL HIP ARTHROPLASTY ANTERIOR APPROACH;  Surgeon: Paralee Cancel, MD;  Location: [facility];  Service: Orthopedics;  Laterality: Left;   TRANSTHORACIC ECHOCARDIOGRAM  05/30/2016  dr Marlou Porch   moderate  LVH ef 60-65%/  bioprothesis aortic valve present ,normal grandient and no AI /  mild MV calcification , moderate MR /  mild PR/ moderate TR/  PASP 55mmHg/ (RA denisty was identified 04-27-2016 echo) and is seen again today, this is likely a promient eustacian ridge, atrium is normal size   TRICUSPID VALVE REPLACEMENT N/A 01/30/2018   Procedure: TRICUSPID VALVE REPAIR USING MC3 SIZE 28;  Surgeon: Rexene Alberts, MD;  Location: [facility];  Service: Open Heart Surgery;  Laterality: N/A;  :   Current Outpatient Medications:    allopurinol (ZYLOPRIM) 300 MG tablet, Take 150 mg by mouth every morning., Disp: , Rfl:    amLODipine (NORVASC) 10 MG tablet, Take 1 tablet (10 mg total) by mouth daily., Disp: 90 tablet, Rfl: 3   atenolol (TENORMIN) 25 MG tablet, Take 0.5 tablets (12.5 mg total) by mouth every morning., Disp: 45 tablet, Rfl: 1   b complex vitamins tablet, Take 1 tablet by mouth every morning., Disp: , Rfl:    cholecalciferol (VITAMIN D3) 25 MCG (1000 UNIT) tablet, Take 1,000 Units by mouth every morning., Disp: , Rfl:    Cinnamon 500 MG  capsule, Take 500 mg by mouth every morning., Disp: , Rfl:    ferrous sulfate 325 (65 FE) MG EC tablet, Take 2 tablets by mouth every morning., Disp: , Rfl:    finasteride (PROSCAR) 5 MG tablet, Take 1 tablet (5 mg total) by mouth daily., Disp: 30 tablet, Rfl: 0   gabapentin (NEURONTIN) 100 MG capsule, Take 100 mg by mouth daily with lunch., Disp: , Rfl:    lisinopril (ZESTRIL) 10 MG tablet, Take 1 tablet (10 mg total) by mouth daily., Disp: 90 tablet, Rfl: 3   tamsulosin (FLOMAX) 0.4 MG CAPS capsule, Take 2 capsules (0.8 mg total) by mouth daily after breakfast., Disp: 30 capsule, Rfl: 0   warfarin (COUMADIN) 5 MG tablet, TAKE 1 TO 1 AND 1/2 TABLETS BY MOUTH DAILY OR AS  DIRECTED BY ANTICOAGULATION CLINIC, Disp: 135 tablet, Rfl: 3   amoxicillin (AMOXIL) 500 MG capsule, Take 2,000 mg by mouth See admin instructions. Take 4 capsules (2000 mg) by mouth 1 hour prior to dental appointment (Patient not taking: Reported on 01/17/2021), Disp: , Rfl: :    Allergies  Allergen Reactions   Sulfa Antibiotics Other (See Comments)    Granulocytosis   Lisinopril     Other reaction(s): cough    FH: No family history of malignancy or blood disorder.  SOCIAL HISTORY: He lives in [place] with his wife.  He has 2 healthy sons.  He is retired from [employer].  He worked in Restaurant manager, fast food.  No tobacco use.  EtOH intake reported at maybe 1 beer per week.  Review of Systems: He is not aware of any bleeding.  No bloody or black stools.  He began oral iron 2 times daily about 4 weeks ago.  He notes stools are "dark" periodically, not black.  No blood with urination.  No epistaxis except occasional blood with nose blowing.  No gum bleeding.  He reports a good appetite.  He lost weight following hip surgery over the summer.  He has gained the weight back.  He denies pain.  No fevers or sweats.  No difficulty swallowing.  No nausea or vomiting.  He eats a regular diet.  No tongue soreness.  He does not crave ice.   No nail changes.  He has stable mild dyspnea on exertion.  No chest pain.  No change in bowel habits.  He has neuropathy in the feet.  He is on gabapentin.  Physical Exam:  Blood pressure 134/74, pulse 76, temperature 98.1 F (36.7 C), temperature source Oral, resp. rate 19, height 6' (1.829 m), weight 181 lb 9.6 oz (82.4 kg), SpO2 100 %.  HEENT: Sclera anicteric.  No thrush or ulcers. Lungs: Lungs clear bilaterally. Cardiac: Regular rate and rhythm. Abdomen: No hepatosplenomegaly. Vascular: No leg edema. Lymph  nodes: No palpable cervical, supraclavicular, axillary or inguinal lymph nodes. Neurologic: Alert and oriented.  Moves all extremities.  Follows commands. Skin: 2 resolving ecchymoses abdominal wall.  LABS:   Recent Labs    01/17/21 1057  WBC 5.0  HGB 12.1*  HCT 37.1*  PLT 172  Peripheral blood smear-few ovalocytes and microcytes, rare teardrop, rare acanthocyte, polychromasia not increased, no nucleated red cells, moderate variation in red cell size; white blood cells unremarkable; platelets normal in number.   No results for input(s): NA, K, CL, CO2, GLUCOSE, BUN, CREATININE, CALCIUM in the last 72 hours.  RADIOLOGY:  No results found.  Assessment and Plan:   Anemia Left hip fracture July 2022 status post left hip replacement  Hospitalization August 2020 with symptomatic anemia, heme positive stool, ferritin 19, status post EGD;  colonoscopy 10/21/2018, gastritis with hemorrhage Atrial fibrillation on Coumadin CAD AVR Mitral valve repair  [name] has been referred for evaluation of anemia.  He was hospitalized in 2020 with symptomatic anemia, heme positive stool, low ferritin.  He underwent GI evaluation at that time.  Hemoglobin subsequently corrected into normal range.  He had a fall in July 2022 resulting in a hip fracture and underwent hip replacement.  Hemoglobin prior to surgery was in normal range, following surgery 8.5.  He began oral iron about 4  weeks ago.  Hemoglobin today is mildly low at 12.1.  Red cell changes on the peripheral blood smear are consistent with iron deficiency.  He may have low-level GI blood loss related to chronic anticoagulation.  He will continue oral iron.  He will complete a set of stool cards.  We are adding ferritin and B12 to today's labs.  He will return for lab and follow-up in approximately 3 months.  He will contact the office in the interim with signs/symptoms suggestive of progressive anemia.  Patient seen with Dr. Benay Spice.    [name], NP 01/17/2021, 12:24 PM   This was a shared visit with [name].  [name] was interviewed and examined.  I reviewed the peripheral blood smear.  He is referred for evaluation of anemia.  He has mild normocytic anemia.  We suspect the anemia is related to recovery from blood loss related anemia following hip surgery last year.  He has a previous history of iron deficiency anemia and a Hemoccult positive stool.  He could also have anemia related to chronic GI blood loss and iron deficiency.  He has very mild anemia today.  We recommended he continue iron.  We will check a ferritin and vitamin B12 level today.  I was present for greater than 50% today's visit.  I performed medical decision making.  Julieanne Manson, MD

## 2021-01-17 ENCOUNTER — Other Ambulatory Visit: Payer: Self-pay

## 2021-01-17 ENCOUNTER — Inpatient Hospital Stay: Payer: Medicare Other | Attending: Oncology

## 2021-01-17 ENCOUNTER — Encounter: Payer: Self-pay | Admitting: Nurse Practitioner

## 2021-01-17 ENCOUNTER — Ambulatory Visit: Payer: Medicare Other | Admitting: Nurse Practitioner

## 2021-01-17 ENCOUNTER — Inpatient Hospital Stay: Payer: Medicare Other | Admitting: Nurse Practitioner

## 2021-01-17 VITALS — BP 134/74 | HR 76 | Temp 98.1°F | Resp 19 | Ht 72.0 in | Wt 181.6 lb

## 2021-01-17 DIAGNOSIS — D649 Anemia, unspecified: Secondary | ICD-10-CM | POA: Insufficient documentation

## 2021-01-17 DIAGNOSIS — I2510 Atherosclerotic heart disease of native coronary artery without angina pectoris: Secondary | ICD-10-CM | POA: Insufficient documentation

## 2021-01-17 DIAGNOSIS — I4891 Unspecified atrial fibrillation: Secondary | ICD-10-CM | POA: Insufficient documentation

## 2021-01-17 DIAGNOSIS — D500 Iron deficiency anemia secondary to blood loss (chronic): Secondary | ICD-10-CM

## 2021-01-17 DIAGNOSIS — Z8781 Personal history of (healed) traumatic fracture: Secondary | ICD-10-CM | POA: Insufficient documentation

## 2021-01-17 DIAGNOSIS — Z7901 Long term (current) use of anticoagulants: Secondary | ICD-10-CM | POA: Diagnosis not present

## 2021-01-17 DIAGNOSIS — K2971 Gastritis, unspecified, with bleeding: Secondary | ICD-10-CM | POA: Diagnosis not present

## 2021-01-17 LAB — CBC WITH DIFFERENTIAL (CANCER CENTER ONLY)
Abs Immature Granulocytes: 0.01 10*3/uL (ref 0.00–0.07)
Basophils Absolute: 0 10*3/uL (ref 0.0–0.1)
Basophils Relative: 1 %
Eosinophils Absolute: 0.1 10*3/uL (ref 0.0–0.5)
Eosinophils Relative: 2 %
HCT: 37.1 % — ABNORMAL LOW (ref 39.0–52.0)
Hemoglobin: 12.1 g/dL — ABNORMAL LOW (ref 13.0–17.0)
Immature Granulocytes: 0 %
Lymphocytes Relative: 35 %
Lymphs Abs: 1.7 10*3/uL (ref 0.7–4.0)
MCH: 31.3 pg (ref 26.0–34.0)
MCHC: 32.6 g/dL (ref 30.0–36.0)
MCV: 96.1 fL (ref 80.0–100.0)
Monocytes Absolute: 0.5 10*3/uL (ref 0.1–1.0)
Monocytes Relative: 10 %
Neutro Abs: 2.6 10*3/uL (ref 1.7–7.7)
Neutrophils Relative %: 52 %
Platelet Count: 172 10*3/uL (ref 150–400)
RBC: 3.86 MIL/uL — ABNORMAL LOW (ref 4.22–5.81)
RDW: 14.7 % (ref 11.5–15.5)
WBC Count: 5 10*3/uL (ref 4.0–10.5)
nRBC: 0 % (ref 0.0–0.2)

## 2021-01-17 LAB — SAVE SMEAR(SSMR), FOR PROVIDER SLIDE REVIEW

## 2021-01-17 LAB — VITAMIN B12: Vitamin B-12: 467 pg/mL (ref 180–914)

## 2021-01-17 LAB — FERRITIN: Ferritin: 92 ng/mL (ref 24–336)

## 2021-01-21 DIAGNOSIS — I4891 Unspecified atrial fibrillation: Secondary | ICD-10-CM | POA: Diagnosis not present

## 2021-01-21 DIAGNOSIS — K2971 Gastritis, unspecified, with bleeding: Secondary | ICD-10-CM | POA: Diagnosis not present

## 2021-01-21 DIAGNOSIS — I2510 Atherosclerotic heart disease of native coronary artery without angina pectoris: Secondary | ICD-10-CM | POA: Diagnosis not present

## 2021-01-21 DIAGNOSIS — Z7901 Long term (current) use of anticoagulants: Secondary | ICD-10-CM | POA: Diagnosis not present

## 2021-01-21 DIAGNOSIS — Z8781 Personal history of (healed) traumatic fracture: Secondary | ICD-10-CM | POA: Diagnosis not present

## 2021-01-21 DIAGNOSIS — D649 Anemia, unspecified: Secondary | ICD-10-CM | POA: Diagnosis not present

## 2021-01-22 DIAGNOSIS — Z8781 Personal history of (healed) traumatic fracture: Secondary | ICD-10-CM | POA: Diagnosis not present

## 2021-01-22 DIAGNOSIS — Z7901 Long term (current) use of anticoagulants: Secondary | ICD-10-CM | POA: Diagnosis not present

## 2021-01-22 DIAGNOSIS — D649 Anemia, unspecified: Secondary | ICD-10-CM | POA: Diagnosis not present

## 2021-01-22 DIAGNOSIS — I4891 Unspecified atrial fibrillation: Secondary | ICD-10-CM | POA: Diagnosis not present

## 2021-01-22 DIAGNOSIS — K2971 Gastritis, unspecified, with bleeding: Secondary | ICD-10-CM | POA: Diagnosis not present

## 2021-01-22 DIAGNOSIS — I2510 Atherosclerotic heart disease of native coronary artery without angina pectoris: Secondary | ICD-10-CM | POA: Diagnosis not present

## 2021-01-23 DIAGNOSIS — Z8781 Personal history of (healed) traumatic fracture: Secondary | ICD-10-CM | POA: Diagnosis not present

## 2021-01-23 DIAGNOSIS — I4891 Unspecified atrial fibrillation: Secondary | ICD-10-CM | POA: Diagnosis not present

## 2021-01-23 DIAGNOSIS — K2971 Gastritis, unspecified, with bleeding: Secondary | ICD-10-CM | POA: Diagnosis not present

## 2021-01-23 DIAGNOSIS — I2510 Atherosclerotic heart disease of native coronary artery without angina pectoris: Secondary | ICD-10-CM | POA: Diagnosis not present

## 2021-01-23 DIAGNOSIS — D649 Anemia, unspecified: Secondary | ICD-10-CM | POA: Diagnosis not present

## 2021-01-23 DIAGNOSIS — Z7901 Long term (current) use of anticoagulants: Secondary | ICD-10-CM | POA: Diagnosis not present

## 2021-01-24 ENCOUNTER — Other Ambulatory Visit (HOSPITAL_BASED_OUTPATIENT_CLINIC_OR_DEPARTMENT_OTHER): Payer: Self-pay

## 2021-01-24 DIAGNOSIS — D649 Anemia, unspecified: Secondary | ICD-10-CM

## 2021-01-24 LAB — OCCULT BLOOD X 1 CARD TO LAB, STOOL
Fecal Occult Bld: NEGATIVE
Fecal Occult Bld: NEGATIVE
Fecal Occult Bld: NEGATIVE

## 2021-01-25 ENCOUNTER — Other Ambulatory Visit: Payer: Self-pay | Admitting: Nurse Practitioner

## 2021-01-25 ENCOUNTER — Telehealth: Payer: Self-pay

## 2021-01-25 DIAGNOSIS — D649 Anemia, unspecified: Secondary | ICD-10-CM

## 2021-01-25 NOTE — Telephone Encounter (Signed)
-----   Message from Owens Shark, NP sent at 01/25/2021  7:34 AM EST ----- Please let him know ferritin and B12 are in normal range, stool negative for blood. Continue oral iron and f/u as scheduled.

## 2021-01-25 NOTE — Telephone Encounter (Signed)
Called and spoke with the patient  to let him know his ferritin and B-12 are normal range, no blood in the stool. And to continue oral iron and f/u as scheduled. Patient voiced understanding instruction and had no further questions or concern at this time.

## 2021-02-06 DIAGNOSIS — L299 Pruritus, unspecified: Secondary | ICD-10-CM | POA: Diagnosis not present

## 2021-02-06 DIAGNOSIS — L570 Actinic keratosis: Secondary | ICD-10-CM | POA: Diagnosis not present

## 2021-02-09 DIAGNOSIS — H53453 Other localized visual field defect, bilateral: Secondary | ICD-10-CM | POA: Diagnosis not present

## 2021-02-09 DIAGNOSIS — H43813 Vitreous degeneration, bilateral: Secondary | ICD-10-CM | POA: Diagnosis not present

## 2021-02-09 DIAGNOSIS — H26493 Other secondary cataract, bilateral: Secondary | ICD-10-CM | POA: Diagnosis not present

## 2021-02-09 DIAGNOSIS — H40023 Open angle with borderline findings, high risk, bilateral: Secondary | ICD-10-CM | POA: Diagnosis not present

## 2021-02-14 ENCOUNTER — Other Ambulatory Visit: Payer: Self-pay

## 2021-02-14 ENCOUNTER — Ambulatory Visit: Payer: Medicare Other

## 2021-02-14 DIAGNOSIS — I63411 Cerebral infarction due to embolism of right middle cerebral artery: Secondary | ICD-10-CM | POA: Diagnosis not present

## 2021-02-14 DIAGNOSIS — Z7901 Long term (current) use of anticoagulants: Secondary | ICD-10-CM | POA: Diagnosis not present

## 2021-02-14 DIAGNOSIS — I4811 Longstanding persistent atrial fibrillation: Secondary | ICD-10-CM | POA: Diagnosis not present

## 2021-02-14 DIAGNOSIS — Z9889 Other specified postprocedural states: Secondary | ICD-10-CM | POA: Diagnosis not present

## 2021-02-14 LAB — POCT INR: INR: 2.3 (ref 2.0–3.0)

## 2021-02-14 NOTE — Patient Instructions (Signed)
Description   Continue taking warfarin 1 tablet daily except for 1.5 tablets on Mondays. Recheck INR in 6 weeks. [facility] [phone].

## 2021-02-21 ENCOUNTER — Other Ambulatory Visit: Payer: Self-pay | Admitting: Physician Assistant

## 2021-02-21 ENCOUNTER — Other Ambulatory Visit: Payer: Self-pay | Admitting: Cardiology

## 2021-02-22 NOTE — Telephone Encounter (Signed)
Prescription refill request received for warfarin Lov: 12/20/20 Kathlen Mody)  Next INR check: 03/22/21 Warfarin tablet strength: 5mg   Appropriate dose and refill sent to requested pharmacy.

## 2021-03-17 DIAGNOSIS — M1711 Unilateral primary osteoarthritis, right knee: Secondary | ICD-10-CM | POA: Diagnosis not present

## 2021-03-17 DIAGNOSIS — Z96642 Presence of left artificial hip joint: Secondary | ICD-10-CM | POA: Diagnosis not present

## 2021-03-17 DIAGNOSIS — M25562 Pain in left knee: Secondary | ICD-10-CM | POA: Diagnosis not present

## 2021-03-17 DIAGNOSIS — M25561 Pain in right knee: Secondary | ICD-10-CM | POA: Diagnosis not present

## 2021-03-17 DIAGNOSIS — M170 Bilateral primary osteoarthritis of knee: Secondary | ICD-10-CM | POA: Diagnosis not present

## 2021-03-21 DIAGNOSIS — L820 Inflamed seborrheic keratosis: Secondary | ICD-10-CM | POA: Diagnosis not present

## 2021-03-21 DIAGNOSIS — L814 Other melanin hyperpigmentation: Secondary | ICD-10-CM | POA: Diagnosis not present

## 2021-03-21 DIAGNOSIS — L578 Other skin changes due to chronic exposure to nonionizing radiation: Secondary | ICD-10-CM | POA: Diagnosis not present

## 2021-03-21 DIAGNOSIS — L821 Other seborrheic keratosis: Secondary | ICD-10-CM | POA: Diagnosis not present

## 2021-03-21 DIAGNOSIS — D1801 Hemangioma of skin and subcutaneous tissue: Secondary | ICD-10-CM | POA: Diagnosis not present

## 2021-03-21 DIAGNOSIS — L570 Actinic keratosis: Secondary | ICD-10-CM | POA: Diagnosis not present

## 2021-03-21 DIAGNOSIS — D229 Melanocytic nevi, unspecified: Secondary | ICD-10-CM | POA: Diagnosis not present

## 2021-03-22 ENCOUNTER — Other Ambulatory Visit: Payer: Self-pay

## 2021-03-22 ENCOUNTER — Ambulatory Visit: Payer: Medicare Other | Admitting: *Deleted

## 2021-03-22 ENCOUNTER — Ambulatory Visit (HOSPITAL_COMMUNITY): Payer: Medicare Other | Attending: Cardiovascular Disease

## 2021-03-22 DIAGNOSIS — Z9889 Other specified postprocedural states: Secondary | ICD-10-CM

## 2021-03-22 DIAGNOSIS — I77810 Thoracic aortic ectasia: Secondary | ICD-10-CM

## 2021-03-22 DIAGNOSIS — I4811 Longstanding persistent atrial fibrillation: Secondary | ICD-10-CM

## 2021-03-22 DIAGNOSIS — Z952 Presence of prosthetic heart valve: Secondary | ICD-10-CM | POA: Diagnosis not present

## 2021-03-22 DIAGNOSIS — Z7901 Long term (current) use of anticoagulants: Secondary | ICD-10-CM | POA: Diagnosis not present

## 2021-03-22 DIAGNOSIS — I63411 Cerebral infarction due to embolism of right middle cerebral artery: Secondary | ICD-10-CM

## 2021-03-22 LAB — ECHOCARDIOGRAM COMPLETE
AV Mean grad: 15 mmHg
AV Peak grad: 27.7 mmHg
Ao pk vel: 2.63 m/s
Area-P 1/2: 1.81 cm2
S' Lateral: 2.6 cm

## 2021-03-22 LAB — POCT INR: INR: 2.6 (ref 2.0–3.0)

## 2021-03-22 NOTE — Patient Instructions (Signed)
Description   ?Continue taking warfarin 1 tablet daily except for 1.5 tablets on Mondays. Recheck INR in 6 weeks. [facility] [phone].  ?  ?  ?

## 2021-03-23 ENCOUNTER — Telehealth: Payer: Self-pay

## 2021-03-23 NOTE — Telephone Encounter (Signed)
The patient has been notified of the result and verbalized understanding.  All questions (if any) were answered. ?Antonieta Iba, RN 03/23/2021 3:14 PM  ? ?

## 2021-03-23 NOTE — Telephone Encounter (Signed)
-----   Message from Liliane Shi, [place] sent at 03/23/2021  8:46 AM EDT ----- ?Normal EF.  MV, TV, AV all look stable.  Dilated aorta stable (not as large on this study compared to last study).   ?PLAN:  ?-Continue current medications/treatment plan and follow up as scheduled.  ?-Repeat echocardiogram 1 year (ascending aorta dilation) ?-send copy to PCP ?[name], PA-C    ?03/23/2021 8:40 AM   ? ?

## 2021-04-21 ENCOUNTER — Inpatient Hospital Stay: Payer: Medicare Other | Attending: Oncology | Admitting: Oncology

## 2021-04-21 ENCOUNTER — Inpatient Hospital Stay: Payer: Medicare Other

## 2021-04-21 VITALS — BP 135/74 | HR 66 | Temp 98.2°F | Resp 18 | Ht 72.0 in | Wt 180.0 lb

## 2021-04-21 DIAGNOSIS — I4891 Unspecified atrial fibrillation: Secondary | ICD-10-CM | POA: Insufficient documentation

## 2021-04-21 DIAGNOSIS — Q438 Other specified congenital malformations of intestine: Secondary | ICD-10-CM | POA: Diagnosis not present

## 2021-04-21 DIAGNOSIS — I2510 Atherosclerotic heart disease of native coronary artery without angina pectoris: Secondary | ICD-10-CM | POA: Diagnosis not present

## 2021-04-21 DIAGNOSIS — Z7901 Long term (current) use of anticoagulants: Secondary | ICD-10-CM | POA: Insufficient documentation

## 2021-04-21 DIAGNOSIS — I471 Supraventricular tachycardia: Secondary | ICD-10-CM | POA: Diagnosis not present

## 2021-04-21 DIAGNOSIS — D649 Anemia, unspecified: Secondary | ICD-10-CM

## 2021-04-21 LAB — CBC WITH DIFFERENTIAL (CANCER CENTER ONLY)
Abs Immature Granulocytes: 0.01 10*3/uL (ref 0.00–0.07)
Basophils Absolute: 0 10*3/uL (ref 0.0–0.1)
Basophils Relative: 1 %
Eosinophils Absolute: 0.1 10*3/uL (ref 0.0–0.5)
Eosinophils Relative: 1 %
HCT: 37.4 % — ABNORMAL LOW (ref 39.0–52.0)
Hemoglobin: 12.5 g/dL — ABNORMAL LOW (ref 13.0–17.0)
Immature Granulocytes: 0 %
Lymphocytes Relative: 28 %
Lymphs Abs: 1.8 10*3/uL (ref 0.7–4.0)
MCH: 32.2 pg (ref 26.0–34.0)
MCHC: 33.4 g/dL (ref 30.0–36.0)
MCV: 96.4 fL (ref 80.0–100.0)
Monocytes Absolute: 0.7 10*3/uL (ref 0.1–1.0)
Monocytes Relative: 11 %
Neutro Abs: 3.8 10*3/uL (ref 1.7–7.7)
Neutrophils Relative %: 59 %
Platelet Count: 148 10*3/uL — ABNORMAL LOW (ref 150–400)
RBC: 3.88 MIL/uL — ABNORMAL LOW (ref 4.22–5.81)
RDW: 13.6 % (ref 11.5–15.5)
WBC Count: 6.3 10*3/uL (ref 4.0–10.5)
nRBC: 0 % (ref 0.0–0.2)

## 2021-04-21 NOTE — Progress Notes (Signed)
?  [place] ?OFFICE PROGRESS NOTE ? ? ?Diagnosis: Anemia ? ?INTERVAL HISTORY:  ? ?[name] [name] returns as scheduled.  He continues iron.  The ferritin and vitamin B12 levels were normal when he was here in January.  Stool Hemoccult cards were negative.  He feels well.  Good appetite.  No bleeding.  He is exercising. ? ?Objective: ? ?Vital signs in last 24 hours: ? ?Blood pressure 135/74, pulse 66, temperature 98.2 ?F (36.8 ?C), temperature source Oral, resp. rate 18, height 6' (1.829 m), weight 180 lb (81.6 kg), SpO2 98 %. ?  ? ?Lymphatics: No cervical, supraclavicular, axillary, or inguinal nodes ?Resp: Lungs clear bilaterally ?Cardio: Regular rate and rhythm ?GI: No mass, no hepatosplenomegaly, nontender ?Vascular: No leg edema ?  ? ?Lab Results: ? ?Lab Results  ?Component Value Date  ? WBC 6.3 04/21/2021  ? HGB 12.5 (L) 04/21/2021  ? HCT 37.4 (L) 04/21/2021  ? MCV 96.4 04/21/2021  ? PLT 148 (L) 04/21/2021  ? NEUTROABS 3.8 04/21/2021  ? ? ?CMP  ?Lab Results  ?Component Value Date  ? NA 143 12/20/2020  ? K 4.6 12/20/2020  ? CL 106 12/20/2020  ? CO2 24 12/20/2020  ? GLUCOSE 103 (H) 12/20/2020  ? BUN 18 12/20/2020  ? CREATININE 1.03 12/20/2020  ? CALCIUM 9.5 12/20/2020  ? PROT 7.2 12/20/2020  ? ALBUMIN 4.4 12/20/2020  ? AST 23 12/20/2020  ? ALT 13 12/20/2020  ? ALKPHOS 76 12/20/2020  ? BILITOT 0.5 12/20/2020  ? GFRNONAA >60 08/27/2020  ? GFRAA 83 11/25/2019  ? ? ?Medications: I have reviewed the patient's current medications. ? ? ?Assessment/Plan: ?Anemia ?Normal ferritin, normal vitamin B12, and negative stool Hemoccult cards January 2023 ?Left hip fracture July 2022 status post left hip replacement  ?Hospitalization August 2020 with symptomatic anemia, heme positive stool, ferritin 19, status post EGD;  colonoscopy 10/21/2018, gastritis with hemorrhage ?Atrial fibrillation on Coumadin ?CAD ?AVR ?Mitral valve repair ? ? ? ?Disposition: ?[name]. Ayers appears well.  The hemoglobin is in the low normal  range.  The platelet count is also at the low end of the normal range today.  I have a low clinical suspicion for a primary hematologic condition such as a lymphoproliferative disorder or myeloma.  He appears asymptomatic from the mild anemia.  I do not recommend further evaluation at present. ? ?He will call for malaise or bleeding.  He will return for an office visit in 6 months.  He will continue oral iron therapy.  He will return to urinalysis to look for evidence of GU blood loss. ? ?Betsy Coder, MD ? ?04/21/2021  ?10:42 AM ? ? ?

## 2021-04-25 ENCOUNTER — Other Ambulatory Visit: Payer: Self-pay | Admitting: Physician Assistant

## 2021-05-03 ENCOUNTER — Ambulatory Visit: Payer: Medicare Other | Admitting: *Deleted

## 2021-05-03 DIAGNOSIS — I63411 Cerebral infarction due to embolism of right middle cerebral artery: Secondary | ICD-10-CM | POA: Diagnosis not present

## 2021-05-03 DIAGNOSIS — I4811 Longstanding persistent atrial fibrillation: Secondary | ICD-10-CM

## 2021-05-03 DIAGNOSIS — Z9889 Other specified postprocedural states: Secondary | ICD-10-CM

## 2021-05-03 DIAGNOSIS — Z7901 Long term (current) use of anticoagulants: Secondary | ICD-10-CM

## 2021-05-03 LAB — POCT INR: INR: 2.2 (ref 2.0–3.0)

## 2021-05-03 NOTE — Patient Instructions (Signed)
Description   ?Continue taking warfarin 1 tablet daily except for 1.5 tablets on Mondays. Recheck INR in 5 weeks with [name]'s Appt. [facility] [phone].  ?  ?  ?

## 2021-05-10 DIAGNOSIS — M25561 Pain in right knee: Secondary | ICD-10-CM | POA: Diagnosis not present

## 2021-05-10 DIAGNOSIS — Z0000 Encounter for general adult medical examination without abnormal findings: Secondary | ICD-10-CM | POA: Diagnosis not present

## 2021-05-10 DIAGNOSIS — R7303 Prediabetes: Secondary | ICD-10-CM | POA: Diagnosis not present

## 2021-05-10 DIAGNOSIS — G629 Polyneuropathy, unspecified: Secondary | ICD-10-CM | POA: Diagnosis not present

## 2021-05-10 DIAGNOSIS — G479 Sleep disorder, unspecified: Secondary | ICD-10-CM | POA: Diagnosis not present

## 2021-05-10 DIAGNOSIS — I10 Essential (primary) hypertension: Secondary | ICD-10-CM | POA: Diagnosis not present

## 2021-05-10 DIAGNOSIS — I5032 Chronic diastolic (congestive) heart failure: Secondary | ICD-10-CM | POA: Diagnosis not present

## 2021-05-10 DIAGNOSIS — I4891 Unspecified atrial fibrillation: Secondary | ICD-10-CM | POA: Diagnosis not present

## 2021-05-10 DIAGNOSIS — D649 Anemia, unspecified: Secondary | ICD-10-CM | POA: Diagnosis not present

## 2021-05-21 ENCOUNTER — Emergency Department (HOSPITAL_COMMUNITY): Payer: Medicare Other

## 2021-05-21 ENCOUNTER — Inpatient Hospital Stay (HOSPITAL_COMMUNITY): Payer: Medicare Other

## 2021-05-21 ENCOUNTER — Other Ambulatory Visit: Payer: Self-pay

## 2021-05-21 ENCOUNTER — Inpatient Hospital Stay (HOSPITAL_COMMUNITY)
Admission: EM | Admit: 2021-05-21 | Discharge: 2021-05-24 | DRG: 065 | Disposition: A | Discharge status: Home Health | Payer: Medicare Other | Attending: Internal Medicine | Admitting: Internal Medicine

## 2021-05-21 DIAGNOSIS — M109 Gout, unspecified: Secondary | ICD-10-CM | POA: Diagnosis present

## 2021-05-21 DIAGNOSIS — R471 Dysarthria and anarthria: Secondary | ICD-10-CM | POA: Diagnosis present

## 2021-05-21 DIAGNOSIS — D696 Thrombocytopenia, unspecified: Secondary | ICD-10-CM | POA: Diagnosis present

## 2021-05-21 DIAGNOSIS — Z953 Presence of xenogenic heart valve: Secondary | ICD-10-CM

## 2021-05-21 DIAGNOSIS — R29810 Facial weakness: Secondary | ICD-10-CM | POA: Diagnosis not present

## 2021-05-21 DIAGNOSIS — I4821 Permanent atrial fibrillation: Secondary | ICD-10-CM | POA: Diagnosis present

## 2021-05-21 DIAGNOSIS — R29701 NIHSS score 1: Secondary | ICD-10-CM | POA: Diagnosis present

## 2021-05-21 DIAGNOSIS — R29818 Other symptoms and signs involving the nervous system: Secondary | ICD-10-CM | POA: Diagnosis not present

## 2021-05-21 DIAGNOSIS — I639 Cerebral infarction, unspecified: Secondary | ICD-10-CM | POA: Diagnosis not present

## 2021-05-21 DIAGNOSIS — I10 Essential (primary) hypertension: Secondary | ICD-10-CM | POA: Diagnosis not present

## 2021-05-21 DIAGNOSIS — R531 Weakness: Secondary | ICD-10-CM | POA: Diagnosis not present

## 2021-05-21 DIAGNOSIS — Z888 Allergy status to other drugs, medicaments and biological substances status: Secondary | ICD-10-CM | POA: Diagnosis not present

## 2021-05-21 DIAGNOSIS — Z7901 Long term (current) use of anticoagulants: Secondary | ICD-10-CM

## 2021-05-21 DIAGNOSIS — Z79899 Other long term (current) drug therapy: Secondary | ICD-10-CM | POA: Diagnosis not present

## 2021-05-21 DIAGNOSIS — Z9889 Other specified postprocedural states: Secondary | ICD-10-CM

## 2021-05-21 DIAGNOSIS — I63513 Cerebral infarction due to unspecified occlusion or stenosis of bilateral middle cerebral arteries: Secondary | ICD-10-CM | POA: Diagnosis not present

## 2021-05-21 DIAGNOSIS — Z952 Presence of prosthetic heart valve: Secondary | ICD-10-CM | POA: Diagnosis not present

## 2021-05-21 DIAGNOSIS — K0889 Other specified disorders of teeth and supporting structures: Secondary | ICD-10-CM | POA: Diagnosis not present

## 2021-05-21 DIAGNOSIS — Z882 Allergy status to sulfonamides status: Secondary | ICD-10-CM

## 2021-05-21 DIAGNOSIS — M47812 Spondylosis without myelopathy or radiculopathy, cervical region: Secondary | ICD-10-CM | POA: Diagnosis not present

## 2021-05-21 DIAGNOSIS — Z743 Need for continuous supervision: Secondary | ICD-10-CM | POA: Diagnosis not present

## 2021-05-21 DIAGNOSIS — Z66 Do not resuscitate: Secondary | ICD-10-CM | POA: Diagnosis present

## 2021-05-21 DIAGNOSIS — Z20822 Contact with and (suspected) exposure to covid-19: Secondary | ICD-10-CM | POA: Diagnosis present

## 2021-05-21 DIAGNOSIS — N401 Enlarged prostate with lower urinary tract symptoms: Secondary | ICD-10-CM | POA: Diagnosis present

## 2021-05-21 DIAGNOSIS — H6090 Unspecified otitis externa, unspecified ear: Secondary | ICD-10-CM

## 2021-05-21 DIAGNOSIS — H6091 Unspecified otitis externa, right ear: Secondary | ICD-10-CM | POA: Diagnosis not present

## 2021-05-21 DIAGNOSIS — Z96642 Presence of left artificial hip joint: Secondary | ICD-10-CM | POA: Diagnosis not present

## 2021-05-21 DIAGNOSIS — I2510 Atherosclerotic heart disease of native coronary artery without angina pectoris: Secondary | ICD-10-CM | POA: Diagnosis not present

## 2021-05-21 DIAGNOSIS — R0689 Other abnormalities of breathing: Secondary | ICD-10-CM | POA: Diagnosis not present

## 2021-05-21 DIAGNOSIS — D500 Iron deficiency anemia secondary to blood loss (chronic): Secondary | ICD-10-CM | POA: Diagnosis present

## 2021-05-21 DIAGNOSIS — Q670 Congenital facial asymmetry: Secondary | ICD-10-CM

## 2021-05-21 DIAGNOSIS — R7303 Prediabetes: Secondary | ICD-10-CM | POA: Diagnosis not present

## 2021-05-21 DIAGNOSIS — R4781 Slurred speech: Secondary | ICD-10-CM | POA: Diagnosis not present

## 2021-05-21 DIAGNOSIS — I4891 Unspecified atrial fibrillation: Secondary | ICD-10-CM | POA: Diagnosis present

## 2021-05-21 DIAGNOSIS — I771 Stricture of artery: Secondary | ICD-10-CM | POA: Diagnosis not present

## 2021-05-21 DIAGNOSIS — I69392 Facial weakness following cerebral infarction: Secondary | ICD-10-CM

## 2021-05-21 DIAGNOSIS — R6510 Systemic inflammatory response syndrome (SIRS) of non-infectious origin without acute organ dysfunction: Secondary | ICD-10-CM

## 2021-05-21 DIAGNOSIS — I6621 Occlusion and stenosis of right posterior cerebral artery: Secondary | ICD-10-CM | POA: Diagnosis not present

## 2021-05-21 DIAGNOSIS — H60501 Unspecified acute noninfective otitis externa, right ear: Secondary | ICD-10-CM

## 2021-05-21 DIAGNOSIS — Z8673 Personal history of transient ischemic attack (TIA), and cerebral infarction without residual deficits: Secondary | ICD-10-CM | POA: Diagnosis present

## 2021-05-21 DIAGNOSIS — I6521 Occlusion and stenosis of right carotid artery: Secondary | ICD-10-CM | POA: Diagnosis not present

## 2021-05-21 DIAGNOSIS — Z8249 Family history of ischemic heart disease and other diseases of the circulatory system: Secondary | ICD-10-CM | POA: Diagnosis not present

## 2021-05-21 DIAGNOSIS — R791 Abnormal coagulation profile: Secondary | ICD-10-CM | POA: Diagnosis present

## 2021-05-21 DIAGNOSIS — R4702 Dysphasia: Secondary | ICD-10-CM | POA: Diagnosis present

## 2021-05-21 DIAGNOSIS — R509 Fever, unspecified: Secondary | ICD-10-CM | POA: Diagnosis not present

## 2021-05-21 DIAGNOSIS — E119 Type 2 diabetes mellitus without complications: Secondary | ICD-10-CM

## 2021-05-21 DIAGNOSIS — R6889 Other general symptoms and signs: Secondary | ICD-10-CM | POA: Diagnosis not present

## 2021-05-21 HISTORY — DX: Systemic inflammatory response syndrome (sirs) of non-infectious origin without acute organ dysfunction: R65.10

## 2021-05-21 LAB — URINALYSIS, ROUTINE W REFLEX MICROSCOPIC
Bacteria, UA: NONE SEEN
Bilirubin Urine: NEGATIVE
Glucose, UA: NEGATIVE mg/dL
Hgb urine dipstick: NEGATIVE
Ketones, ur: NEGATIVE mg/dL
Leukocytes,Ua: NEGATIVE
Nitrite: POSITIVE — AB
Protein, ur: NEGATIVE mg/dL
Specific Gravity, Urine: 1.012 (ref 1.005–1.030)
pH: 6 (ref 5.0–8.0)

## 2021-05-21 LAB — CBC
HCT: 37.7 % — ABNORMAL LOW (ref 39.0–52.0)
Hemoglobin: 12.3 g/dL — ABNORMAL LOW (ref 13.0–17.0)
MCH: 32.4 pg (ref 26.0–34.0)
MCHC: 32.6 g/dL (ref 30.0–36.0)
MCV: 99.2 fL (ref 80.0–100.0)
Platelets: 126 K/uL — ABNORMAL LOW (ref 150–400)
RBC: 3.8 MIL/uL — ABNORMAL LOW (ref 4.22–5.81)
RDW: 13.5 % (ref 11.5–15.5)
WBC: 12.7 K/uL — ABNORMAL HIGH (ref 4.0–10.5)
nRBC: 0 % (ref 0.0–0.2)

## 2021-05-21 LAB — I-STAT CHEM 8, ED
BUN: 13 mg/dL (ref 8–23)
Calcium, Ion: 1.07 mmol/L — ABNORMAL LOW (ref 1.15–1.40)
Chloride: 104 mmol/L (ref 98–111)
Creatinine, Ser: 0.9 mg/dL (ref 0.61–1.24)
Glucose, Bld: 150 mg/dL — ABNORMAL HIGH (ref 70–99)
HCT: 38 % — ABNORMAL LOW (ref 39.0–52.0)
Hemoglobin: 12.9 g/dL — ABNORMAL LOW (ref 13.0–17.0)
Potassium: 3.7 mmol/L (ref 3.5–5.1)
Sodium: 139 mmol/L (ref 135–145)
TCO2: 23 mmol/L (ref 22–32)

## 2021-05-21 LAB — DIFFERENTIAL
Abs Immature Granulocytes: 0.05 10*3/uL (ref 0.00–0.07)
Basophils Absolute: 0 10*3/uL (ref 0.0–0.1)
Basophils Relative: 0 %
Eosinophils Absolute: 0 10*3/uL (ref 0.0–0.5)
Eosinophils Relative: 0 %
Immature Granulocytes: 0 %
Lymphocytes Relative: 6 %
Lymphs Abs: 0.8 10*3/uL (ref 0.7–4.0)
Monocytes Absolute: 1.4 10*3/uL — ABNORMAL HIGH (ref 0.1–1.0)
Monocytes Relative: 11 %
Neutro Abs: 10.5 10*3/uL — ABNORMAL HIGH (ref 1.7–7.7)
Neutrophils Relative %: 83 %

## 2021-05-21 LAB — APTT: aPTT: 49 s — ABNORMAL HIGH (ref 24–36)

## 2021-05-21 LAB — COMPREHENSIVE METABOLIC PANEL WITH GFR
ALT: 15 U/L (ref 0–44)
AST: 23 U/L (ref 15–41)
Albumin: 3.5 g/dL (ref 3.5–5.0)
Alkaline Phosphatase: 52 U/L (ref 38–126)
Anion gap: 8 (ref 5–15)
BUN: 13 mg/dL (ref 8–23)
CO2: 22 mmol/L (ref 22–32)
Calcium: 9 mg/dL (ref 8.9–10.3)
Chloride: 108 mmol/L (ref 98–111)
Creatinine, Ser: 0.94 mg/dL (ref 0.61–1.24)
GFR, Estimated: >60 mL/min
Glucose, Bld: 145 mg/dL — ABNORMAL HIGH (ref 70–99)
Potassium: 3.7 mmol/L (ref 3.5–5.1)
Sodium: 138 mmol/L (ref 135–145)
Total Bilirubin: 1.6 mg/dL — ABNORMAL HIGH (ref 0.3–1.2)
Total Protein: 7.2 g/dL (ref 6.5–8.1)

## 2021-05-21 LAB — RESP PANEL BY RT-PCR (FLU A&B, COVID) ARPGX2
Influenza A by PCR: NEGATIVE
Influenza B by PCR: NEGATIVE
SARS Coronavirus 2 by RT PCR: NEGATIVE

## 2021-05-21 LAB — RAPID URINE DRUG SCREEN, HOSP PERFORMED
Amphetamines: NOT DETECTED
Barbiturates: NOT DETECTED
Benzodiazepines: NOT DETECTED
Cocaine: NOT DETECTED
Opiates: NOT DETECTED
Tetrahydrocannabinol: NOT DETECTED

## 2021-05-21 LAB — LACTIC ACID, PLASMA
Lactic Acid, Venous: 1.3 mmol/L (ref 0.5–1.9)
Lactic Acid, Venous: 1.2 mmol/L (ref 0.5–1.9)

## 2021-05-21 LAB — PROTIME-INR
INR: 1.8 — ABNORMAL HIGH (ref 0.8–1.2)
Prothrombin Time: 20.7 s — ABNORMAL HIGH (ref 11.4–15.2)

## 2021-05-21 MED ORDER — GABAPENTIN 100 MG PO CAPS
200.0000 mg | ORAL_CAPSULE | Freq: Every day | ORAL | Status: DC
  Administered 2021-05-21 – 2021-05-23 (×3): 200 mg via ORAL
  Filled 2021-05-21 (×3): qty 2

## 2021-05-21 MED ORDER — ROSUVASTATIN CALCIUM 5 MG PO TABS
10.0000 mg | ORAL_TABLET | Freq: Every day | ORAL | Status: DC
  Administered 2021-05-21 – 2021-05-24 (×4): 10 mg via ORAL
  Filled 2021-05-21 (×4): qty 2

## 2021-05-21 MED ORDER — SODIUM CHLORIDE 0.9 % IV SOLN: INTRAVENOUS | Status: DC

## 2021-05-21 MED ORDER — SODIUM CHLORIDE 0.9 % IV SOLN
100.0000 mL/h | INTRAVENOUS | Status: DC
  Administered 2021-05-21: 100 mL/h via INTRAVENOUS

## 2021-05-21 MED ORDER — ACETAMINOPHEN 650 MG RE SUPP: 650.0000 mg | RECTAL | Status: DC | PRN

## 2021-05-21 MED ORDER — STROKE: EARLY STAGES OF RECOVERY BOOK
Freq: Once | Status: AC
  Filled 2021-05-21: qty 1

## 2021-05-21 MED ORDER — IOHEXOL 350 MG/ML SOLN
75.0000 mL | Freq: Once | INTRAVENOUS | Status: AC | PRN
  Administered 2021-05-21: 75 mL via INTRAVENOUS

## 2021-05-21 MED ORDER — WARFARIN SODIUM 7.5 MG PO TABS
7.5000 mg | ORAL_TABLET | Freq: Once | ORAL | Status: AC
  Administered 2021-05-21: 7.5 mg via ORAL
  Filled 2021-05-21: qty 1

## 2021-05-21 MED ORDER — ACETAMINOPHEN 325 MG PO TABS: 650.0000 mg | ORAL_TABLET | ORAL | Status: DC | PRN

## 2021-05-21 MED ORDER — ONDANSETRON HCL 4 MG/2ML IJ SOLN
4.0000 mg | Freq: Once | INTRAMUSCULAR | Status: AC
  Administered 2021-05-21: 4 mg via INTRAVENOUS
  Filled 2021-05-21: qty 2

## 2021-05-21 MED ORDER — CIPROFLOXACIN-DEXAMETHASONE 0.3-0.1 % OT SUSP
4.0000 [drp] | Freq: Two times a day (BID) | OTIC | Status: DC
  Administered 2021-05-21 – 2021-05-24 (×7): 4 [drp] via OTIC
  Filled 2021-05-21 (×3): qty 7.5

## 2021-05-21 MED ORDER — ONDANSETRON HCL 4 MG/2ML IJ SOLN: 4.0000 mg | Freq: Four times a day (QID) | INTRAMUSCULAR | Status: DC | PRN

## 2021-05-21 MED ORDER — ALLOPURINOL 100 MG PO TABS
150.0000 mg | ORAL_TABLET | Freq: Every morning | ORAL | Status: DC
  Administered 2021-05-22 – 2021-05-24 (×3): 150 mg via ORAL
  Filled 2021-05-21 (×3): qty 2

## 2021-05-21 MED ORDER — SODIUM CHLORIDE 0.9 % IV SOLN: 1.0000 g | Freq: Once | INTRAVENOUS | Status: DC

## 2021-05-21 MED ORDER — SODIUM CHLORIDE 0.9 % IV BOLUS
500.0000 mL | Freq: Once | INTRAVENOUS | Status: AC
  Administered 2021-05-21: 500 mL via INTRAVENOUS

## 2021-05-21 MED ORDER — METRONIDAZOLE 500 MG/100ML IV SOLN: 500.0000 mg | Freq: Two times a day (BID) | INTRAVENOUS | Status: DC

## 2021-05-21 MED ORDER — TRAZODONE HCL 50 MG PO TABS: 50.0000 mg | ORAL_TABLET | Freq: Every evening | ORAL | Status: DC | PRN

## 2021-05-21 MED ORDER — FINASTERIDE 5 MG PO TABS
5.0000 mg | ORAL_TABLET | Freq: Every day | ORAL | Status: DC
  Administered 2021-05-21 – 2021-05-24 (×4): 5 mg via ORAL
  Filled 2021-05-21 (×4): qty 1

## 2021-05-21 MED ORDER — ACETAMINOPHEN 325 MG PO TABS
650.0000 mg | ORAL_TABLET | Freq: Once | ORAL | Status: AC
  Administered 2021-05-21: 650 mg via ORAL
  Filled 2021-05-21: qty 2

## 2021-05-21 MED ORDER — FOSFOMYCIN TROMETHAMINE 3 G PO PACK
3.0000 g | PACK | Freq: Once | ORAL | Status: AC
  Administered 2021-05-21: 3 g via ORAL
  Filled 2021-05-21: qty 3

## 2021-05-21 MED ORDER — ACETAMINOPHEN 160 MG/5ML PO SOLN: 650.0000 mg | ORAL | Status: DC | PRN

## 2021-05-21 MED ORDER — ENSURE ENLIVE PO LIQD
237.0000 mL | Freq: Two times a day (BID) | ORAL | Status: DC
  Administered 2021-05-22 – 2021-05-24 (×5): 237 mL via ORAL
  Filled 2021-05-21 (×3): qty 237

## 2021-05-21 MED ORDER — SENNOSIDES-DOCUSATE SODIUM 8.6-50 MG PO TABS: 1.0000 | ORAL_TABLET | Freq: Every evening | ORAL | Status: DC | PRN

## 2021-05-21 MED ORDER — WARFARIN - PHARMACIST DOSING INPATIENT
Freq: Every day | Status: DC
Start: 2021-05-22 — End: 2021-05-24

## 2021-05-21 MED ORDER — AMOXICILLIN-POT CLAVULANATE 875-125 MG PO TABS
1.0000 | ORAL_TABLET | Freq: Two times a day (BID) | ORAL | Status: DC
  Administered 2021-05-21 – 2021-05-23 (×4): 1 via ORAL
  Filled 2021-05-21 (×4): qty 1

## 2021-05-21 MED ORDER — HYDRALAZINE HCL 20 MG/ML IJ SOLN: 10.0000 mg | Freq: Three times a day (TID) | INTRAMUSCULAR | Status: DC | PRN

## 2021-05-21 MED ORDER — FERROUS SULFATE 325 (65 FE) MG PO TABS
325.0000 mg | ORAL_TABLET | Freq: Two times a day (BID) | ORAL | Status: DC
  Administered 2021-05-21 – 2021-05-24 (×6): 325 mg via ORAL
  Filled 2021-05-21 (×6): qty 1

## 2021-05-21 MED ORDER — SODIUM CHLORIDE 0.9 % IV SOLN: 100.0000 mL/h | INTRAVENOUS | Status: DC

## 2021-05-21 NOTE — Assessment & Plan Note (Signed)
Stable at baseline ?Followed by hematology with low clinical suspicion for malignancy ?Continue iron ?

## 2021-05-21 NOTE — H&P (Signed)
?History and Physical  ? ? ?Patient: [name]. OVF:[id number] DOB: December 24, 1935 ?DOA: 05/21/2021 ?DOS: the patient was seen and examined on 05/21/2021 ?PCP: Marda Stalker, PA-C  ?Patient coming from: Home - lives with wife and son. Ambulates independently.  ? ? ?Chief Complaint: generalized weakness  ? ?HPI: [name]. is a 86 y.o. male with medical history significant of BPH, non obstructive CAD by [facility] in 2019, atrial fibrillation s/p MAZE, s/p AVR 7/20212 secondary to aortic insufficiency, hx of CVA, HTN, hx of MVR with complaints of generalized weakness.  He states he tried to put his clothes on and couldn't get up from the floor due to weakness. His son had to help him get up. His wife thought this was very abnormal for him and called EMS. He also states he has had some dizziness and lightheadedness with position changes. Wife states she also thought his speech was more slurred and he had a left facial droop.  ? ? ?Denies any fever/chills, vision changes/headaches, chest pain or palpitations, shortness of breath or cough, abdominal pain, V/D, dysuria or leg swelling. Had some nausea/episode of vomiting when he first got to ER, but resolved.  ? ?He does wonder if he has a tooth infected. He states it hurt so much he couldn't sleep last night. He also thought he may have some swelling on the right side of his face. It didn't hurt to chew his food this morning.  ? ? ?Does not smoke or drink. Unsure of family hx of CVA  ? ?ER Course:  vitals: afebrile, bp: 153/90, HR: 92, RR: 17, oxygen: 96%RA tmax: 101  ?Pertinent labs: wbc: 12.7, hgb: 12.3, platelets 126, UA: positive nitrite, INR 1.8, covid/flu negative  ?CXR: no acute finding ?CT head: no acute finding. Chronic changes with old right MCA infarct ?Brain MRI:  punctate acute to subacute left frontal lobe infarct.  ?In ED: given IVF, neurology consulted  ? ? ?Review of Systems: As mentioned in the history of present illness. All other systems  reviewed and are negative. ?Past Medical History:  ?Diagnosis Date  ? Acquired dilation of ascending aorta and aortic root (HCC)   ? 86m aortic root and 483mascending aorta on echo 03/2020  ? Bladder stones   ? Borderline diabetes   ? BPH (benign prostatic hyperplasia)   ? CAD (coronary artery disease) 12/19/2020  ? Non-obstructive coronary artery disease at cath in 2019  ? Coronary artery disease   ? cardiologist-  dr skNat Matherhart NP--- per cath 06-02-2010 non-obstructive cad pLAD 30-40%  ? Diverticulosis of colon   ? Dysrhythmia   ? afib  ? Gout   ? Heart murmur   ? History of adenomatous polyp of colon   ? 2002-- tubular adenoma  ? History of aortic insufficiency   ? severe -- s/p  AVR 08-03-2010  ? History of small bowel obstruction   ? 02/ 2007 mechanical sbo s/p  surgical intervention;  partial sbo 09/ 2011 and 03-20-2011 resolved without surgical intervention  ? History of urinary retention   ? HTN (hypertension)   ? Peripheral neuropathy   ? Persistent atrial fibrillation ([facility]  ? S/P aortic valve replacement with prosthetic valve 08/03/2010  ? tissue valve  ? S/P Maze operation for atrial fibrillation 01/30/2018  ? Complete bilateral atrial lesion set using bipolar radiofrequency and cryothermy with clipping of LA appendage  ? S/P MVR (mitral valve repair) 01/30/2018  ? Complex valvuloplasty including artificial Gore-tex  neochord placement x4 and Carbo medics Annuloflex ring annuloplasty, size 28  ? S/P patent foramen ovale closure 08/03/2010  ? at same time AVR  ? S/P tricuspid valve repair 01/30/2018  ? Using an MC3 Annuloplasty ring, size 28  ? Stroke [facility])   ? Tricuspid regurgitation   ? ?Past Surgical History:  ?Procedure Laterality Date  ? BIOPSY  09/07/2018  ? Procedure: BIOPSY;  Surgeon: Otis Brace, MD;  [facility];  Service: Gastroenterology;;  ? CARDIAC CATHETERIZATION  06-02-2010  dr Marlou Porch  ? non-obstructive cad- pLAD 30-40%/  normal LVSF/  severe AI  ? CARDIOVASCULAR  STRESS TEST  04/12/2016  ? Low risk nuclear perfusion study w/ no significant reversible ischemia/  normal LV function and wall motion ,  stress ef 60%/  16m inferior and lateral scooped ST-segment depression w/ exercise (may be repolarization abnormality), exercise capacity was moderately reduced  ? CATARACT EXTRACTION W/ INTRAOCULAR LENS  IMPLANT, BILATERAL  02/2010  ? CLIPPING OF ATRIAL APPENDAGE N/A 01/30/2018  ? Procedure: CLIPPING OF LEFT ATRIAL APPENDAGE USING ATRICLIP PRO2 45MM;  Surgeon: ORexene Alberts MD;  [facility]  Service: Open Heart Surgery;  Laterality: N/A;  ? COLONOSCOPY WITH PROPOFOL N/A 10/21/2018  ? Procedure: COLONOSCOPY WITH PROPOFOL;  Surgeon: BOtis Brace MD;  [facility];  Service: Gastroenterology;  Laterality: N/A;  ? CYSTOSCOPY WITH LITHOLAPAXY N/A 06/05/2016  ? Procedure: CYSTOSCOPY WITH LITHOLAPAXY and fulgarization of bladder neck;  Surgeon: WIrine Seal MD;  [facility]  Service: [facility];  Laterality: N/A;  ? ESOPHAGOGASTRODUODENOSCOPY (EGD) WITH PROPOFOL N/A 09/07/2018  ? Procedure: ESOPHAGOGASTRODUODENOSCOPY (EGD) WITH PROPOFOL;  Surgeon: BOtis Brace MD;  [facility];  Service: Gastroenterology;  Laterality: N/A;  ? EXPLORATORY LAPARTOMY /  CHOLECYSTECTOMY  02/28/2005  ? for Small  bowel obstruction (mechnical)  ? IR ANGIO EXTRACRAN SEL COM CAROTID INNOMINATE UNI L MOD SED  10/24/2016  ? IR ANGIO VERTEBRAL SEL SUBCLAVIAN INNOMINATE BILAT MOD SED  10/24/2016  ? IR PERCUTANEOUS ART THROMBECTOMY/INFUSION INTRACRANIAL INC DIAG ANGIO  10/24/2016  ? IR RADIOLOGIST EVAL & MGMT  12/05/2016  ? LEFT KNEE ARTHROSCOPY  2006  ? MAZE N/A 01/30/2018  ? Procedure: MAZE;  Surgeon: ORexene Alberts MD;  [place] [facility]  Service: Open Heart Surgery;  Laterality: N/A;  ? MITRAL VALVE REPAIR N/A 01/30/2018  ? Procedure: MITRAL VALVE REPAIR (MVR) USING CARBOMEDICS ANNULOFLEX SIZE 28;  Surgeon: ORexene Alberts MD;  [place] [facility]   Service: Open Heart Surgery;  Laterality: N/A;  ? POLYPECTOMY  10/21/2018  ? Procedure: POLYPECTOMY;  Surgeon: BOtis Brace MD;  [facility];  Service: Gastroenterology;;  ? RADIOLOGY WITH ANESTHESIA N/A 10/24/2016  ? Procedure: RADIOLOGY WITH ANESTHESIA;  Surgeon: DLuanne Bras MD;  [facility] [facility]  Service: Radiology;  Laterality: N/A;  ? RIGHT FOOT SURGERY    ? RIGHT MINIATURE ANTERIOR THORACOTOMY FOR AORTIC VALVE REPLACEMENT AND CLOSURE PATENT FORAMEN OVALE  08-03-2010  DR ORoxy Manns ? Edwards Magna-ease pericardial tissue valve (276m  ? RIGHT/LEFT HEART CATH AND CORONARY ANGIOGRAPHY N/A 10/14/2017  ? Procedure: RIGHT/LEFT HEART CATH AND CORONARY ANGIOGRAPHY;  Surgeon: CoSherren MochaMD;  [facility];  Service: Cardiovascular;  Laterality: N/A;  ? TEE WITHOUT CARDIOVERSION N/A 10/14/2017  ? Procedure: TRANSESOPHAGEAL ECHOCARDIOGRAM (TEE);  Surgeon: NiJosue HectorMD;  [facility];  Service: Cardiovascular;  Laterality: N/A;  ? TOTAL HIP ARTHROPLASTY Left 08/02/2020  ? Procedure: TOTAL HIP ARTHROPLASTY ANTERIOR APPROACH;  Surgeon:  Paralee Cancel, MD;  [facility];  Service: Orthopedics;  Laterality: Left;  ? TRANSTHORACIC ECHOCARDIOGRAM  05/30/2016  dr Marlou Porch  ? moderate  LVH ef 60-65%/  bioprothesis aortic valve present ,normal grandient and no AI /  mild MV calcification , moderate MR /  mild PR/ moderate TR/  PASP 46mHg/ (RA denisty was identified 04-27-2016 echo) and is seen again today, this is likely a promient eustacian ridge, atrium is normal size  ? TRICUSPID VALVE REPLACEMENT N/A 01/30/2018  ? Procedure: TRICUSPID VALVE REPAIR USING MC3 SIZE 28;  Surgeon: ORexene Alberts MD;  [facility]  Service: Open Heart Surgery;  Laterality: N/A;  ? ?Social History:  reports that he has never smoked. He has never used smokeless tobacco. He reports current alcohol use. He reports that he does not use drugs. ? ?Allergies  ?Allergen Reactions  ? Sulfa  Antibiotics Other (See Comments)  ?  Granulocytosis  ? Lisinopril   ?  Other reaction(s): cough  ? ? ?Family History  ?Problem Relation Age of Onset  ? Heart disease Mother   ? Brain cancer Father   ? ? ?Prior t

## 2021-05-21 NOTE — Progress Notes (Signed)
ANTICOAGULATION CONSULT NOTE - Initial Consult ? ?Pharmacy Consult for warfarin ?Indication:  Bioprosthetic AVR ? ?Allergies  ?Allergen Reactions  ? Sulfa Antibiotics Other (See Comments)  ?  Granulocytosis  ? Lisinopril   ?  Other reaction(s): cough  ? ? ?Patient Measurements: ?Height: 6' (182.9 cm) ?Weight: 81.6 kg (180 lb) ?IBW/kg (Calculated) : 77.6 ? ? ?Vital Signs: ?Temp: 99.5 ?F (37.5 ?C) (05/14 1805) ?Temp Source: Oral (05/14 1445) ?BP: 127/79 (05/14 1645) ?Pulse Rate: 90 (05/14 1645) ? ?Labs: ?Recent Labs  ?  05/21/21 ?1300 05/21/21 ?1356  ?HGB 12.3* 12.9*  ?HCT 37.7* 38.0*  ?PLT 126*  --   ?APTT 49*  --   ?LABPROT 20.7*  --   ?INR 1.8*  --   ?CREATININE 0.94 0.90  ? ? ?Estimated Creatinine Clearance: 68.3 mL/min (by C-G formula based on SCr of 0.9 mg/dL). ? ? ?Medical History: ?Past Medical History:  ?Diagnosis Date  ? Acquired dilation of ascending aorta and aortic root (HCC)   ? 33m aortic root and 457mascending aorta on echo 03/2020  ? Bladder stones   ? Borderline diabetes   ? BPH (benign prostatic hyperplasia)   ? CAD (coronary artery disease) 12/19/2020  ? Non-obstructive coronary artery disease at cath in 2019  ? Coronary artery disease   ? cardiologist-  dr skNat Matherhart NP--- per cath 06-02-2010 non-obstructive cad pLAD 30-40%  ? Diverticulosis of colon   ? Dysrhythmia   ? afib  ? Gout   ? Heart murmur   ? History of adenomatous polyp of colon   ? 2002-- tubular adenoma  ? History of aortic insufficiency   ? severe -- s/p  AVR 08-03-2010  ? History of small bowel obstruction   ? 02/ 2007 mechanical sbo s/p  surgical intervention;  partial sbo 09/ 2011 and 03-20-2011 resolved without surgical intervention  ? History of urinary retention   ? HTN (hypertension)   ? Peripheral neuropathy   ? Persistent atrial fibrillation ([facility]  ? S/P aortic valve replacement with prosthetic valve 08/03/2010  ? tissue valve  ? S/P Maze operation for atrial fibrillation 01/30/2018  ? Complete bilateral atrial  lesion set using bipolar radiofrequency and cryothermy with clipping of LA appendage  ? S/P MVR (mitral valve repair) 01/30/2018  ? Complex valvuloplasty including artificial Gore-tex neochord placement x4 and Carbo medics Annuloflex ring annuloplasty, size 28  ? S/P patent foramen ovale closure 08/03/2010  ? at same time AVR  ? S/P tricuspid valve repair 01/30/2018  ? Using an MC3 Annuloplasty ring, size 28  ? [facility]  ? Tricuspid regurgitation   ? ? ?Medications:  ?(Not in a hospital admission)  ? ?Assessment: ?[id number] who presents with facial droop, weakness and dysarthria. Brain imaging showed acute to subacute left frontal lobe infarct. Patient is on warfarin at home for h/o bioprosthetic AVR. Pharmacy consulted to continue warfarin therapy. INR is subtherapeutic on admission. Last dose of warfarin was yesterday ? ?H/H mildly low, Plt low ? ?Home warfarin regimen: 7.5 mg on Monday, 5 mg on all other days  ? ?Goal of Therapy:  ?INR 2-3 ?Monitor platelets by anticoagulation protocol: Yes ?  ?Plan:  ?-Warfarin 7.5 mg x 1 dose ?-Daily PT/INR ?-Monitor CBC, renal fx, cultures and clinical progress ? ? ?BeAlbertina ParrPharmD., BCCCP ?Clinical Pharmacist ?Please refer to AMION for unit-specific pharmacist  ? ? ?

## 2021-05-21 NOTE — ED Notes (Signed)
Admit MD at bedside

## 2021-05-21 NOTE — Assessment & Plan Note (Signed)
At baseline, continue to monitor °

## 2021-05-21 NOTE — Assessment & Plan Note (Signed)
Normally functioning mitral valve repair and tricuspid valve repair by echocardiogram March 2023 ?

## 2021-05-21 NOTE — Assessment & Plan Note (Addendum)
Non obstructive CAD by [place] 2019  ?Hold atenolol in setting of acute CVA  ?

## 2021-05-21 NOTE — Assessment & Plan Note (Addendum)
86 year old with history of weakness and slurred speech with history of previous CVA, HTN, afib on coumadin found to have new very small acute left frontal lobe infarct.  ?-MRI with left frontal lobe subacute/acute CVA noted ?-Neurochecks per protocol ?-Neurology consulted, but did not feel like they would need to officially see patient unless CTA head/neck show new abnormal finding as stroke is small and left facial deficits from previous CVA  ?-CTA head/neck with no LVO ?-echo recently done in 03/2021  ?-a1c pending and LDL 77. On no statin, will start this. -crestor '10mg'$ /day  ?-on coumadin-subtherapeutic  ?-Permissive hypertension first 24 hours <220/110 ?-SLP evaluation completed and patient on heart healthy diet ?-OT evaluation this a.m. with recommendation for SNF on discharge ? ?

## 2021-05-21 NOTE — ED Provider Notes (Signed)
?[facility] ?Provider Note ? ? ?CSN: [id number] ?Arrival date & time: 05/21/21  1248 ? ?  ? ?History ? ?Chief Complaint  ?Patient presents with  ? Weakness  ? ? ?[name]. is a 86 y.o. male. ? ?HPI ?Patient presents via EMS with concern for facial droop, weakness and dysarthria.  History is obtained by the patient and EMS providers.  Additional details on chart review.  Yesterday the patient had generalized weakness, went to bed otherwise in his usual state of health.  Today, upon awakening his family noticed that he had facial asymmetry, initially reported as right-sided, but more likely left-sided. ?  ? ?Home Medications ?Prior to Admission medications   ?Medication Sig Start Date End Date Taking? Authorizing Provider  ?allopurinol (ZYLOPRIM) 300 MG tablet Take 150 mg by mouth every morning.   Yes [provider]  ?amLODipine (NORVASC) 10 MG tablet TAKE 1 TABLET BY MOUTH  DAILY ?Patient taking differently: Take 10 mg by mouth daily. 04/26/21  Yes Weaver, Scott T, PA-C  ?atenolol (TENORMIN) 25 MG tablet TAKE ONE-HALF TABLET BY  MOUTH IN THE MORNING ?Patient taking differently: Take 12.5 mg by mouth daily. 02/22/21  Yes [name] T, PA-C  ?Cinnamon 500 MG capsule Take 500 mg by mouth every morning.   Yes [provider]  ?ferrous sulfate 325 (65 FE) MG EC tablet Take 325 mg by mouth 2 (two) times daily. 10/29/19  Yes [provider]  ?finasteride (PROSCAR) 5 MG tablet Take 1 tablet (5 mg total) by mouth daily. 11/05/16  Yes Angiulli, Lavon Paganini, PA-C  ?gabapentin (NEURONTIN) 100 MG capsule Take 200 mg by mouth at bedtime. 05/03/20  Yes [provider]  ?Glucos-Chondroit-Collag-Hyal (GLUCOSAMINE CHONDROIT-COLLAGEN PO) Take 1 tablet by mouth 2 (two) times daily.   Yes [provider]  ?Multiple Vitamins-Minerals (CENTRUM SILVER ADULT 50+) TABS Take by mouth.   Yes [provider]  ?tamsulosin (FLOMAX) 0.4 MG CAPS  capsule Take 2 capsules (0.8 mg total) by mouth daily after breakfast. 11/05/16  Yes Angiulli, Lavon Paganini, PA-C  ?traZODone (DESYREL) 50 MG tablet Take 50 mg by mouth at bedtime as needed.   Yes [provider]  ?warfarin (COUMADIN) 5 MG tablet TAKE 1 TO 1 AND 1/2 TABLETS BY MOUTH DAILY OR AS  DIRECTED BY ANTICOAGULATION CLINIC ?Patient taking differently: Take 5-7.5 mg by mouth See admin instructions. Take 5 mg ( 1 tablet) every day except Monday. On Monday, take 7.5 mg (1.5 tablets). 02/22/21  Yes Jerline Pain, MD  ?amoxicillin (AMOXIL) 500 MG capsule Take 2,000 mg by mouth See admin instructions. Take 4 capsules (2000 mg) by mouth 1 hour prior to dental appointment ?Patient not taking: Reported on 01/17/2021    [provider]  ?lisinopril (ZESTRIL) 10 MG tablet TAKE 1 TABLET BY MOUTH  DAILY ?Patient taking differently: Take 10 mg by mouth daily. 04/26/21   [name] T, PA-C  ?   ? ?Allergies    ?Sulfa antibiotics and Lisinopril   ? ?Review of Systems   ?Review of Systems  ?All other systems reviewed and are negative. ? ?Physical Exam ?Updated Vital Signs ?BP (!) 153/90 (BP Location: Right Arm)   Pulse 92   Temp 98.5 ?F (36.9 ?C) (Oral)   Resp 17   Ht 6' (1.829 m)   Wt 81.6 kg   SpO2 96%   BMI 24.41 kg/m?  ?Physical Exam ?Vitals and nursing note reviewed.  ?Constitutional:   ?  General: He is not in acute distress. ?   Appearance: He is well-developed. He is not toxic-appearing or diaphoretic.  ?HENT:  ?   Head: Normocephalic and atraumatic.  ?Eyes:  ?   Conjunctiva/sclera: Conjunctivae normal.  ?Cardiovascular:  ?   Rate and Rhythm: Normal rate. Rhythm irregular.  ?Pulmonary:  ?   Effort: Pulmonary effort is normal. No respiratory distress.  ?   Breath sounds: No stridor.  ?Abdominal:  ?   General: There is no distension.  ?Skin: ?   General: Skin is warm and dry.  ?Neurological:  ?   Mental Status: He is alert and oriented to person, place, and time.  ?   Cranial Nerves: Dysarthria  and facial asymmetry present.  ?   Motor: Atrophy present. No tremor, abnormal muscle tone or pronator drift.  ?   Coordination: Coordination normal.  ? ? ?ED Results / Procedures / Treatments   ?Labs ?(all labs ordered are listed, but only abnormal results are displayed) ?Labs Reviewed  ?I-STAT CHEM 8, ED - Abnormal; Notable for the following components:  ?    Result Value  ? Glucose, Bld 150 (*)   ? Calcium, Ion 1.07 (*)   ? Hemoglobin 12.9 (*)   ? HCT 38.0 (*)   ? All other components within normal limits  ?RESP PANEL BY RT-PCR (FLU A&B, COVID) ARPGX2  ?PROTIME-INR  ?APTT  ?CBC  ?DIFFERENTIAL  ?COMPREHENSIVE METABOLIC PANEL  ?RAPID URINE DRUG SCREEN, HOSP PERFORMED  ?URINALYSIS, ROUTINE W REFLEX MICROSCOPIC  ? ? ?EKG ?EKG Interpretation ? ?Date/Time:  Sunday May 21 2021 12:58:51 EDT ?Ventricular Rate:  95 ?PR Interval:    ?QRS Duration: 96 ?QT Interval:  342 ?QTC Calculation: 430 ?R Axis:   96 ?Text Interpretation: Atrial fibrillation with ventricular escape complexes Anterior infarct, old Borderline repolarization abnormality Abnormal ECG Confirmed by Carmin Muskrat [phone]) on 05/21/2021 1:46:41 PM ? ?Radiology ?CT HEAD WO CONTRAST ? ?Result Date: 05/21/2021 ?CLINICAL DATA:  Neuro deficit, weakness EXAM: CT HEAD WITHOUT CONTRAST TECHNIQUE: Contiguous axial images were obtained from the base of the skull through the vertex without intravenous contrast. RADIATION DOSE REDUCTION: This exam was performed according to the departmental dose-optimization program which includes automated exposure control, adjustment of the mA and/or kV according to patient size and/or use of iterative reconstruction technique. COMPARISON:  CT head 10/24/2016 FINDINGS: Brain: No acute intracranial hemorrhage, mass effect, or hydrocephalus. Severe cortical volume loss, advanced since previous study. Patchy hypodensities in the periventricular and subcortical white matter, likely secondary to chronic microvascular ischemic changes.  Encephalomalacia from an old infarct in the right MCA territory centered in the frontal lobe and basal ganglia regions. Vascular: No hyperdense vessel or unexpected calcification. Skull: Normal. Negative for fracture or focal lesion. Sinuses/Orbits: No acute finding. Other: Mild opacification in the right mastoid air cells. IMPRESSION: 1. No acute intracranial process identified. 2. Chronic changes including old right MCA infarct and advanced cortical volume loss. Electronically Signed   By: Ofilia Neas M.D.   On: 05/21/2021 13:24   ? ?Procedures ?Procedures  ? ? ?Medications Ordered in ED ?Medications  ?sodium chloride 0.9 % bolus 500 mL (500 mLs Intravenous New Bag/Given 05/21/21 1351)  ?  Followed by  ?0.9 %  sodium chloride infusion (has no administration in time range)  ? ? ?ED Course/ Medical Decision Making/ A&P ?This patient with a Hx of stroke, aortic valve repair, diabetes, hypertension presents to the ED for concern of facial asymmetry, dysarthria, weakness for 1 day, this  involves an extensive number of treatment options, and is a complaint that carries with it a high risk of complications and morbidity.   ? ?The differential diagnosis includes stroke, infection, electrolyte abnormalities, recrudescence of prior infarct ? ? ?Social Determinants of Health: ? ?Age, comorbidities including hypertension, diabetes, prior stroke ? ?Additional history obtained: ? ?Additional history and/or information obtained from EMS, notable for details of history as above, absence of hemodynamic instability in transport ? ? ?After the initial evaluation, orders, including: CT, MR, labs were initiated. ? ? ?Patient placed on Cardiac and Pulse-Oximetry Monitors. ?The patient was maintained on a cardiac monitor.  The cardiac monitored showed an rhythm of A-fib 90 abnormal ? ?The patient was also maintained on pulse oximetry. The readings were typically 99% room air normal ? ?On repeat evaluation of the patient stayed the  same ? ?Lab Tests: ? ?I personally interpreted labs.  The pertinent results include: Leukocytosis ? ?Imaging Studies ordered: ? ?I independently visualized and interpreted imaging which showed head CT unremarkable, MRI,

## 2021-05-21 NOTE — Assessment & Plan Note (Addendum)
During hospital work up for stroke found to have fever to 101 and met SIRS criteria with WBC and HR 92.  ?-CXR with no acute finding ?-covid/flu negative  ?-blood cultures obtained ?-right ear with otitis externa, treating with cipro dex  ?-urine is positive for nitrites. Will add on culture and give 1 dose of fosfomycin  ?-also has complaints of tooth pain and concern for abscess. On exam do not see obvious abscess. Will start augmentin  ?-he is not altered and INR 1.8, not best candidate for LP ?-no abdominal complaints.  ?-trend fever curve and cbc  ?

## 2021-05-21 NOTE — ED Notes (Signed)
Patient transported to CT 

## 2021-05-21 NOTE — ED Notes (Signed)
ED Provider at bedside. 

## 2021-05-21 NOTE — Assessment & Plan Note (Addendum)
Could be multifactorial ?-new CVA, albeit very small  ?-SIRS/sepsis ?-also complains of dizziness/lightheadedness with standing, will check orthostatics, hold BP meds and flomax and will continue gentle IVF  ?-PT/OT to eval  ?

## 2021-05-21 NOTE — Assessment & Plan Note (Addendum)
Continue proscar, hold flomax until orthostatics result  ?

## 2021-05-21 NOTE — ED Provider Notes (Signed)
?  Physical Exam  ?BP 127/79   Pulse 90   Temp (!) 101 ?F (38.3 ?C) (Oral)   Resp (!) 26   Ht 6' (1.829 m)   Wt 81.6 kg   SpO2 95%   BMI 24.41 kg/m?  ? ?Physical Exam ? ?Procedures  ?Procedures ? ?ED Course / MDM  ?  ?Medical Decision Making ?Care assumed at 3 pm.  Patient woke up today with trouble speaking and had generalized weakness yesterday.  Patient had previous history of stroke.  Patient also started running a fever 101.  Signed out pending MRI brain and also urinalysis.  ? ?5:11 PM ?I reviewed patient's labs and independently interpreted his imaging.  MRI brain showed acute to subacute left frontal lobe infarct.  Given that he woke up with it I think this is more acute.  Urinalysis is normal.  Patient's lactate is normal and white blood cell count is 12.7.  Patient has some drainage from the right ear. I examined his ear and there is evidence of otitis externa but the TM appears normal.  We will start patient on Ciprodex.  Blood and urine cultures are sent.  His COVID test is negative. ? ?Problems Addressed: ?Cerebrovascular accident (CVA), unspecified mechanism ([place]): acute illness or injury ?Facial asymmetry: acute illness or injury ?Fever in adult: acute illness or injury ?Weakness: acute illness or injury ? ?Amount and/or Complexity of Data Reviewed ?Labs: ordered. Decision-making details documented in ED Course. ?Radiology: ordered and independent interpretation performed. Decision-making details documented in ED Course. ?ECG/medicine tests: ordered and independent interpretation performed. Decision-making details documented in ED Course. ? ?Risk ?OTC drugs. ?Prescription drug management. ?Decision regarding hospitalization. ? ? ? ? ? ? ? ?  ?Drenda Freeze, MD ?05/21/21 1714 ? ?

## 2021-05-21 NOTE — ED Triage Notes (Signed)
Via EMS. Pt from home stated this morning around 10am reported BLE weakness. GCS 15 LKN 2200 5/13. Ems noted L sided facial droop and slurred speech along with BLE weakness. CBG 150. NIH 1 at this time for facial droop. '4mg'$  zofran 564m NS given PTA  ?

## 2021-05-21 NOTE — Assessment & Plan Note (Signed)
Allow for permissive HTN in setting of acute CVA ?Home medication on hold: lisinopril '10mg'$ , atenolol 12.'5mg'$  and norvasc '10mg'$  ?Prn hydralazine as needed  ?

## 2021-05-21 NOTE — Assessment & Plan Note (Addendum)
S/p MAZE in 2020 ?Rate controlled, continue coumadin per pharmacy dosing  ?INR 1.7 today, recommend not taking glucosamine, can interfere with coumadin  ?

## 2021-05-21 NOTE — Assessment & Plan Note (Signed)
S/p bioprosthetic AVR in 2012.   ?-Normally functioning bioprosthetic AVR by echocardiogram in March 2023.   ?-Continue SBE prophylaxis. ?

## 2021-05-21 NOTE — ED Notes (Signed)
I&O cath performed apx 885ms in output. Specimens sent off  ?

## 2021-05-21 NOTE — ED Notes (Signed)
To MRI

## 2021-05-21 NOTE — ED Notes (Signed)
Pt had episode of emesis x 1  ?

## 2021-05-21 NOTE — ED Notes (Signed)
Pt has serosanguineous drainage from R eye, denies fall/trauma MD aware ?

## 2021-05-21 NOTE — Assessment & Plan Note (Signed)
Right ear. Continue ciprodex ?TM pearly with light reflex  ?

## 2021-05-22 DIAGNOSIS — I639 Cerebral infarction, unspecified: Secondary | ICD-10-CM | POA: Diagnosis not present

## 2021-05-22 LAB — BLOOD CULTURE ID PANEL (REFLEXED) - BCID2

## 2021-05-22 LAB — PROTIME-INR
INR: 1.7 — ABNORMAL HIGH (ref 0.8–1.2)
Prothrombin Time: 19.5 seconds — ABNORMAL HIGH (ref 11.4–15.2)

## 2021-05-22 LAB — LIPID PANEL
Cholesterol: 130 mg/dL (ref 0–200)
HDL: 43 mg/dL (ref >40)
LDL Cholesterol: 77 mg/dL (ref 0–99)
Total CHOL/HDL Ratio: 3 RATIO
Triglycerides: 49 mg/dL (ref <150)
VLDL: 10 mg/dL (ref 0–40)

## 2021-05-22 MED ORDER — WARFARIN SODIUM 7.5 MG PO TABS
7.5000 mg | ORAL_TABLET | Freq: Once | ORAL | Status: AC
  Administered 2021-05-22: 7.5 mg via ORAL
  Filled 2021-05-22: qty 1

## 2021-05-22 NOTE — Progress Notes (Signed)
Physical Therapy Evaluation ?Patient Details ?Name: [name]. ?MRN: [id number] ?DOB: 1935-04-10 ?[name] Date: 05/22/2021 ? ?History of Present Illness ? Pt is a 86 y/o male presenting on 5/14 with generalized weakness. MRI + punctate acute to subacute L frontal lobe infarct and with R ear otitis externa. PMH includes: CAD, afib s/p MAZE, s/p AVR due to aortic insufficiency, CVA, HTN, THA.  ?Clinical Impression ? Pt was seen for initial mobility on side of bed and to attempt standing, with significant issues from R ear infection and inability to walk.  Pt is unsafe and attempting to move as PT is asking him to wait.  Will recommend he go to CIR for rehab prior to home as he is with family 24/7 and should have good follow up to do HHPT by then.  Follow up with him to work on balance and strengthening with gait quality as his ultimate goal.  POC with acute PT goals as outlined below.   ?   ? ?Recommendations for follow up therapy are one component of a multi-disciplinary discharge planning process, led by the attending physician.  Recommendations may be updated based on patient status, additional functional criteria and insurance authorization. ? ?Follow Up Recommendations Acute inpatient rehab (3hours/day) ? ?  ?Assistance Recommended at Discharge Frequent or constant Supervision/Assistance  ?Patient can return home with the following ? Two people to help with walking and/or transfers;A lot of help with bathing/dressing/bathroom;Assistance with cooking/housework;Direct supervision/assist for medications management;Assist for transportation;Help with stairs or ramp for entrance ? ?  ?Equipment Recommendations None recommended by PT  ?Recommendations for Other Services ? Rehab consult  ?  ?Functional Status Assessment Patient has had a recent decline in their functional status and demonstrates the ability to make significant improvements in function in a reasonable and predictable amount of time.  ? ?   ?Precautions / Restrictions Precautions ?Precautions: Fall ?Precaution Comments: R ear has serous drainage ?Restrictions ?Weight Bearing Restrictions: No  ? ?  ? ?Mobility ? Bed Mobility ?Overal bed mobility: Needs Assistance ?Bed Mobility: Supine to Sit, Sit to Supine ?  ?  ?Supine to sit: Mod assist ?Sit to supine: Mod assist ?  ?General bed mobility comments: mod to return to bed due to pt being unable to sidestep up the bed to return to sitting ?  ? ?Transfers ?Overall transfer level: Needs assistance ?Equipment used: 1 person hand held assist ?Transfers: Sit to/from Stand ?Sit to Stand: Mod assist ?  ?  ?  ?  ?  ?General transfer comment: mod to balance and using bed to stabilize posteriorly ?  ? ?Ambulation/Gait ?  ?  ?  ?  ?  ?  ?  ?General Gait Details: pt was unable to sidestep in standing, unsafe to maneuver and unaware of the balance loss magnitude ? ?Stairs ?  ?  ?  ?  ?  ? ?Wheelchair Mobility ?  ? ?Modified Rankin (Stroke Patients Only) ?  ? ?  ? ?Balance Overall balance assessment: Needs assistance ?Sitting-balance support: Single extremity supported ?Sitting balance-Leahy Scale: Fair ?Sitting balance - Comments: initially R and posterior leaning, then midline oriented with min guard ?Postural control: Posterior lean, Right lateral lean ?Standing balance support: Bilateral upper extremity supported, During functional activity ?Standing balance-Leahy Scale: Poor ?Standing balance comment: unable to get pt to stand up away from the gurney ?  ?  ?  ?  ?  ?  ?  ?  ?  ?  ?  ?   ? ? ? ?  Pertinent Vitals/Pain Pain Assessment ?Pain Assessment: Faces ?Faces Pain Scale: Hurts a little bit ?Pain Location: R ear/jaw ?Pain Descriptors / Indicators: Guarding ?Pain Intervention(s): Monitored during session, Other (comment) (RN aware of drainage and pain)  ? ? ?Home Living Family/patient expects to be discharged to:: Private residence ?Living Arrangements: Spouse/significant other;Children ?Available Help at  Discharge: Family;Available 24 hours/day ?Type of Home: House ?Home Access: Stairs to enter ?Entrance Stairs-Rails: Right ?Entrance Stairs-Number of Steps: 3 ?  ?Home Layout: Two level;Able to live on main level with bedroom/bathroom ?Home Equipment: Conservation officer, nature (2 wheels);Cane - single point;Shower seat;BSC/3in1;Grab bars - tub/shower ?   ?  ?Prior Function Prior Level of Function : Independent/Modified Independent;Driving ?  ?  ?  ?  ?  ?  ?Mobility Comments: no AD needed ?  ?  ? ? ?Hand Dominance  ? Dominant Hand: Right ? ?  ?Extremity/Trunk Assessment  ? Upper Extremity Assessment ?Upper Extremity Assessment: Generalized weakness ?  ? ?Lower Extremity Assessment ?Lower Extremity Assessment: Generalized weakness ?  ? ?Cervical / Trunk Assessment ?Cervical / Trunk Assessment: Kyphotic  ?Communication  ? Communication: No difficulties  ?Cognition Arousal/Alertness: Awake/alert ?Behavior During Therapy: [facility] for tasks assessed/performed ?Overall Cognitive Status: Impaired/Different from baseline ?Area of Impairment: Safety/judgement, Awareness, Problem solving, Attention, Orientation, Following commands ?  ?  ?  ?  ?  ?  ?  ?  ?Orientation Level: Situation ?Current Attention Level: Selective ?  ?Following Commands: Follows one step commands with increased time, Follows one step commands inconsistently ?Safety/Judgement: Decreased awareness of safety, Decreased awareness of deficits ?Awareness: Emergent, Intellectual ?Problem Solving: Slow processing, Requires verbal cues, Requires tactile cues ?General Comments: pt asking to walk away from bed, fairly unaware of his level of assist needed for gait and balance ?  ?  ? ?  ?General Comments General comments (skin integrity, edema, etc.): pt asked to walk in the room and kept trying to pull away from PT to go that direction, and finally had to get him back to bed due to his instability and inability to sidestep to [facility] ? ?  ?Exercises    ? ?Assessment/Plan  ?  ?PT  Assessment Patient needs continued PT services  ?PT Problem List Decreased strength;Decreased activity tolerance;Decreased balance;Decreased coordination;Decreased safety awareness;Pain;Decreased skin integrity ? ?   ?  ?PT Treatment Interventions DME instruction;Gait training;Functional mobility training;Therapeutic activities;Therapeutic exercise;Balance training;Neuromuscular re-education;Patient/family education;Stair training   ? ?PT Goals (Current goals can be found in the Care Plan section)  ?Acute Rehab PT Goals ?Patient Stated Goal: to walk and go home ?PT Goal Formulation: With patient ?Time For Goal Achievement: 06/05/21 ?Potential to Achieve Goals: Good ? ?  ?Frequency Min 4X/week ?  ? ? ?Co-evaluation   ?  ?  ?  ?  ? ? ?  ?AM-PAC PT "6 Clicks" Mobility  ?Outcome Measure Help needed turning from your back to your side while in a flat bed without using bedrails?: A Lot ?Help needed moving from lying on your back to sitting on the side of a flat bed without using bedrails?: A Lot ?Help needed moving to and from a bed to a chair (including a wheelchair)?: A Lot ?Help needed standing up from a chair using your arms (e.g., wheelchair or bedside chair)?: A Lot ?Help needed to walk in hospital room?: Total ?Help needed climbing 3-5 steps with a railing? : Total ?6 Click Score: 10 ? ?  ?End of Session Equipment Utilized During Treatment: Gait belt ?Activity Tolerance: Patient limited by fatigue;Treatment  limited secondary to medical complications (Comment) ?Patient left: in bed;with call bell/phone within reach ?Nurse Communication: Mobility status;Other (comment) (discussion of drainage from R ear) ?PT Visit Diagnosis: Other abnormalities of gait and mobility (R26.89);Unsteadiness on feet (R26.81);Muscle weakness (generalized) (M62.81);History of falling (Z91.81);Difficulty in walking, not elsewhere classified (R26.2) ?  ? ?Time: 7412-8786 ?PT Time Calculation (min) (ACUTE ONLY): 32 min ? ? ?Charges:   PT  Evaluation ?$PT Eval Moderate Complexity: 1 Mod ?PT Treatments ?$Therapeutic Activity: 8-22 mins ?  ?   ? ?Ramond Dial ?05/22/2021, 1:34 PM ? ?Mee Hives, PT PhD ?Acute Rehab Dept. Number: [facility] [phone] and [place] [phone] ? ?

## 2021-05-22 NOTE — Evaluation (Signed)
Clinical/Bedside Swallow Evaluation ?Patient Details  ?Name: [name]. ?MRN: [id number] ?Date of Birth: 1935-01-25 ? ?[name] Date: 05/22/2021 ?Time: SLP Start Time (ACUTE ONLY): 1000 SLP Stop Time (ACUTE ONLY): 1030 ?SLP Time Calculation (min) (ACUTE ONLY): 30 min ? ?Past Medical History:  ?Past Medical History:  ?Diagnosis Date  ? Acquired dilation of ascending aorta and aortic root (HCC)   ? 31m aortic root and 476mascending aorta on echo 03/2020  ? Bladder stones   ? Borderline diabetes   ? BPH (benign prostatic hyperplasia)   ? CAD (coronary artery disease) 12/19/2020  ? Non-obstructive coronary artery disease at cath in 2019  ? Coronary artery disease   ? cardiologist-  dr skNat Matherhart NP--- per cath 06-02-2010 non-obstructive cad pLAD 30-40%  ? Diverticulosis of colon   ? Dysrhythmia   ? afib  ? Gout   ? Heart murmur   ? History of adenomatous polyp of colon   ? 2002-- tubular adenoma  ? History of aortic insufficiency   ? severe -- s/p  AVR 08-03-2010  ? History of small bowel obstruction   ? 02/ 2007 mechanical sbo s/p  surgical intervention;  partial sbo 09/ 2011 and 03-20-2011 resolved without surgical intervention  ? History of urinary retention   ? HTN (hypertension)   ? Peripheral neuropathy   ? Persistent atrial fibrillation ([place]  ? S/P aortic valve replacement with prosthetic valve 08/03/2010  ? tissue valve  ? S/P Maze operation for atrial fibrillation 01/30/2018  ? Complete bilateral atrial lesion set using bipolar radiofrequency and cryothermy with clipping of LA appendage  ? S/P MVR (mitral valve repair) 01/30/2018  ? Complex valvuloplasty including artificial Gore-tex neochord placement x4 and [employer] Annuloflex ring annuloplasty, size 28  ? S/P patent foramen ovale closure 08/03/2010  ? at same time AVR  ? S/P tricuspid valve repair 01/30/2018  ? Using an MC3 Annuloplasty ring, size 28  ? [facility]  ? Tricuspid regurgitation   ? ?Past Surgical History:  ?Past Surgical  History:  ?Procedure Laterality Date  ? BIOPSY  09/07/2018  ? Procedure: BIOPSY;  Surgeon: BrOtis BraceMD;  [facility];  Service: Gastroenterology;;  ? CARDIAC CATHETERIZATION  06-02-2010  dr skMarlou Porch? non-obstructive cad- pLAD 30-40%/  normal LVSF/  severe AI  ? CARDIOVASCULAR STRESS TEST  04/12/2016  ? Low risk nuclear perfusion study w/ no significant reversible ischemia/  normal LV function and wall motion ,  stress ef 60%/  71m271mnferior and lateral scooped ST-segment depression w/ exercise (may be repolarization abnormality), exercise capacity was moderately reduced  ? CATARACT EXTRACTION W/ INTRAOCULAR LENS  IMPLANT, BILATERAL  02/2010  ? CLIPPING OF ATRIAL APPENDAGE N/A 01/30/2018  ? Procedure: CLIPPING OF LEFT ATRIAL APPENDAGE USING ATRICLIP PRO2 45MM;  Surgeon: OweRexene AlbertsD;  [facility];  Laterality: N/A;  ? COLONOSCOPY WITH PROPOFOL N/A 10/21/2018  ? Procedure: COLONOSCOPY WITH PROPOFOL;  Surgeon: BraOtis BraceD;  [facility];  Service: Gastroenterology;  Laterality: N/A;  ? CYSTOSCOPY WITH LITHOLAPAXY N/A 06/05/2016  ? Procedure: CYSTOSCOPY WITH LITHOLAPAXY and fulgarization of bladder neck;  Surgeon: WreIrine SealD;  [facility];  Laterality: N/A;  ? ESOPHAGOGASTRODUODENOSCOPY (EGD) WITH PROPOFOL N/A 09/07/2018  ? Procedure: ESOPHAGOGASTRODUODENOSCOPY (EGD) WITH PROPOFOL;  Surgeon: BraOtis BraceD;  [facility];  Service: Gastroenterology;  Laterality: N/A;  ? EXPLORATORY LAPARTOMY /  CHOLECYSTECTOMY  02/28/2005  ? for Small  bowel obstruction (mechnical)  ? IR ANGIO EXTRACRAN SEL COM CAROTID INNOMINATE UNI L MOD SED  10/24/2016  ? IR ANGIO VERTEBRAL SEL SUBCLAVIAN INNOMINATE BILAT MOD SED  10/24/2016  ? IR PERCUTANEOUS ART THROMBECTOMY/INFUSION INTRACRANIAL INC DIAG ANGIO  10/24/2016  ? IR RADIOLOGIST EVAL & MGMT  12/05/2016  ? LEFT KNEE ARTHROSCOPY  2006  ? MAZE N/A  01/30/2018  ? Procedure: MAZE;  Surgeon: Rexene Alberts, MD;  Location: [place];  Service: Open Heart Surgery;  Laterality: N/A;  ? MITRAL VALVE REPAIR N/A 01/30/2018  ? Procedure: MITRAL VALVE REPAIR (MVR) USING CARBOMEDICS ANNULOFLEX SIZE 28;  Surgeon: Rexene Alberts, MD;  Location: [place];  Service: Open Heart Surgery;  Laterality: N/A;  ? POLYPECTOMY  10/21/2018  ? Procedure: POLYPECTOMY;  Surgeon: Otis Brace, MD;  [facility];  Service: Gastroenterology;;  ? RADIOLOGY WITH ANESTHESIA N/A 10/24/2016  ? Procedure: RADIOLOGY WITH ANESTHESIA;  Surgeon: Luanne Bras, MD;  Location: [place];  Service: Radiology;  Laterality: N/A;  ? RIGHT FOOT SURGERY    ? RIGHT MINIATURE ANTERIOR THORACOTOMY FOR AORTIC VALVE REPLACEMENT AND CLOSURE PATENT FORAMEN OVALE  08-03-2010  DR Roxy Manns  ? Edwards Magna-ease pericardial tissue valve (42m)  ? RIGHT/LEFT HEART CATH AND CORONARY ANGIOGRAPHY N/A 10/14/2017  ? Procedure: RIGHT/LEFT HEART CATH AND CORONARY ANGIOGRAPHY;  Surgeon: CSherren Mocha MD;  [facility];  Service: Cardiovascular;  Laterality: N/A;  ? TEE WITHOUT CARDIOVERSION N/A 10/14/2017  ? Procedure: TRANSESOPHAGEAL ECHOCARDIOGRAM (TEE);  Surgeon: NJosue Hector MD;  [facility];  Service: Cardiovascular;  Laterality: N/A;  ? TOTAL HIP ARTHROPLASTY Left 08/02/2020  ? Procedure: TOTAL HIP ARTHROPLASTY ANTERIOR APPROACH;  Surgeon: OParalee Cancel MD;  [facility];  Service: Orthopedics;  Laterality: Left;  ? TRANSTHORACIC ECHOCARDIOGRAM  05/30/2016  dr sMarlou Porch ? moderate  LVH ef 60-65%/  bioprothesis aortic valve present ,normal grandient and no AI /  mild MV calcification , moderate MR /  mild PR/ moderate TR/  PASP 364mg/ (RA denisty was identified 04-27-2016 echo) and is seen again today, this is likely a promient eustacian ridge, atrium is normal size  ? TRICUSPID VALVE REPLACEMENT N/A 01/30/2018  ? Procedure: TRICUSPID VALVE REPAIR USING MC3 SIZE 28;  Surgeon: OwRexene AlbertsMD;  [place] [facility] Service: [facility];  Laterality: N/A;  ? ?HPI:  ?[age]ear old with history of weakness and slurred speech with history of previous CVA, HTN, afib on coumadin found to have new very small acute left frontal lobe infarct. Pt has a remote history of dysphagia from 2018.  ?  ?Assessment / Plan / Recommendation  ?Clinical Impression ? Pt demonstrates no signs of aspiration or dysphagia. He and his wife deny any recent history of dysphagia or use of thickened liquids. He did have some trouble swallowing after a stroke in 2018, but that resolved prior to d/c home. Pt to continue regular diet and thin liquids. No SLP f/u needed. ?  ?   ?Aspiration Risk ?    ?  ?Diet Recommendation Regular;Thin liquid  ? ?Liquid Administration via: Cup;Straw ?Medication Administration: Whole meds with liquid ?Supervision: Patient able to self feed  ?  ?Other  Recommendations     ? ?Recommendations for follow up therapy are one component of a multi-disciplinary discharge planning process, led by the attending physician.  Recommendations may be updated based on patient status, additional functional criteria and insurance authorization. ? ?Follow  up Recommendations No SLP follow up  ? ? ?  ?Assistance Recommended at Discharge    ?Functional Status Assessment    ?Frequency and Duration    ?  ?  ?   ? ?Prognosis    ? ?  ? ?Swallow Study   ?General HPI: 86 year old with history of weakness and slurred speech with history of previous CVA, HTN, afib on coumadin found to have new very small acute left frontal lobe infarct. Pt has a remote history of dysphagia from 2018. ?Type of Study: Bedside Swallow Evaluation ?Diet Prior to this Study: Regular;Thin liquids ?Temperature Spikes Noted: No ?Respiratory Status: Room air ?History of Recent Intubation: No ?Behavior/Cognition: Alert;Cooperative;Pleasant mood ?Oral Cavity Assessment: Within Functional Limits ?Oral Care Completed by SLP: No ?Oral Cavity -  Dentition: Adequate natural dentition ?Vision: Functional for self-feeding ?Self-Feeding Abilities: Able to feed self ?Patient Positioning: Upright in bed ?Baseline Vocal Quality: Normal ?Volitional Cough: Str

## 2021-05-22 NOTE — ED Notes (Signed)
Breakfast order placed ?

## 2021-05-22 NOTE — Hospital Course (Addendum)
PMH of BPH, non obstructive CAD by LHC in 2019, atrial fibrillation s/p MAZE, s/p AVR 7/20212 secondary to aortic insufficiency, hx of CVA, HTN, hx of MVR with complaints of generalized weakness. -Patient was admitted with concerns for acute CVA and brain MRI does demonstrate acute/subacute left frontal lobe infarct.

## 2021-05-22 NOTE — Evaluation (Signed)
Occupational Therapy Evaluation ?Patient Details ?Name: [name]. ?MRN: [id number] ?DOB: 04-Apr-1935 ?[name] Date: 05/22/2021 ? ? ?History of Present Illness Pt is a 86 y/o male presenting on 5/14 with generalized weakness. MRI + punctate acute to subacute L frontal lobe infarct and with R ear otitis externa. PMH includes: CAD, afib s/p MAZE, s/p AVR due to aortic insufficiency, CVA, HTN, THA.  ? ?Clinical Impression ?  ?PTA patient independent.  Admitted for above and presents with problem list below, including impaired balance, decreased activity tolerance, generalized weakness, and poor problem solving/awareness/safety. He requires up to mod assist for bed mobility, mod assist for transfers and min-mod assist for ADLs.  Pt with heavy posterior lean in sitting and standing, poor awareness to deficits.  He reports he has good support from family at home.  Believe he will best benefit from continued OT services acutely and after dc at AIR level to optimize independence, safety and return to PLOF. Will follow acutely.  ? ?BP supine 146/76 ?BP sitting 130/79 ?BP supine 142/95 (unable to get standing due to unsteadiness) ?   ? ?Recommendations for follow up therapy are one component of a multi-disciplinary discharge planning process, led by the attending physician.  Recommendations may be updated based on patient status, additional functional criteria and insurance authorization.  ? ?Follow Up Recommendations ? Acute inpatient rehab (3hours/day)  ?  ?Assistance Recommended at Discharge Frequent or constant Supervision/Assistance  ?Patient can return home with the following Two people to help with walking and/or transfers;A lot of help with bathing/dressing/bathroom;Assistance with cooking/housework;Direct supervision/assist for medications management;Assist for transportation;Direct supervision/assist for financial management;Help with stairs or ramp for entrance ? ?  ?Functional Status Assessment ? Patient  has had a recent decline in their functional status and demonstrates the ability to make significant improvements in function in a reasonable and predictable amount of time.  ?Equipment Recommendations ? BSC/3in1  ?  ?Recommendations for Other Services Rehab consult;PT consult;Speech consult ? ? ?  ?Precautions / Restrictions Precautions ?Precautions: Fall ?Restrictions ?Weight Bearing Restrictions: No  ? ?  ? ?Mobility Bed Mobility ?Overal bed mobility: Needs Assistance ?Bed Mobility: Supine to Sit, Sit to Supine ?  ?  ?Supine to sit: Mod assist ?Sit to supine: Min assist ?  ?General bed mobility comments: mod assist to ascend trunk and min assist to return back to supine ?  ? ?Transfers ?  ?  ?  ?  ?  ?  ?  ?  ?  ?  ?  ? ?  ?Balance Overall balance assessment: Needs assistance ?Sitting-balance support: No upper extremity supported, Feet supported ?Sitting balance-Leahy Scale: Fair ?Sitting balance - Comments: inital posterior lean, improved to min guard to min assist for safety due to posterior bias ?Postural control: Posterior lean ?Standing balance support: Bilateral upper extremity supported, During functional activity ?Standing balance-Leahy Scale: Poor ?Standing balance comment: heavy posterio lean with mod assist required to shift forward ?  ?  ?  ?  ?  ?  ?  ?  ?  ?  ?  ?   ? ?ADL either performed or assessed with clinical judgement  ? ?ADL Overall ADL's : Needs assistance/impaired ?  ?  ?Grooming: Minimal assistance;Sitting ?  ?  ?  ?  ?  ?Upper Body Dressing : Minimal assistance;Sitting ?  ?Lower Body Dressing: Moderate assistance;Sit to/from stand ?Lower Body Dressing Details (indicate cue type and reason): able to don socks from bed level, but requires B UE support when standing ?  Toilet Transfer: Moderate assistance ?Toilet Transfer Details (indicate cue type and reason): simulated side stepping to [facility], requires B UE support and presents with posterior lean ?  ?  ?  ?  ?Functional mobility during ADLs:  Moderate assistance;Cueing for safety ?General ADL Comments: pt limited by impaired balance, weakness and decreased activity tolerance  ? ? ? ?Vision Baseline Vision/History: 1 Wears glasses (distance) ?Ability to See in Adequate Light: 0 Adequate ?Patient Visual Report: No change from baseline ?Vision Assessment?: No apparent visual deficits  ?   ?Perception   ?  ?Praxis   ?  ? ?Pertinent Vitals/Pain Pain Assessment ?Pain Assessment: 0-10 ?Pain Score: 2  ?Pain Location: R ear/jaw ?Pain Descriptors / Indicators: Jabbing (intermittent) ?Pain Intervention(s): Monitored during session  ? ? ? ?Hand Dominance Right ?  ?Extremity/Trunk Assessment Upper Extremity Assessment ?Upper Extremity Assessment: Generalized weakness ?  ?Lower Extremity Assessment ?Lower Extremity Assessment: Defer to PT evaluation ?  ?  ?  ?Communication Communication ?Communication: No difficulties ?  ?Cognition Arousal/Alertness: Awake/alert ?Behavior During Therapy: [facility] for tasks assessed/performed ?Overall Cognitive Status: Impaired/Different from baseline ?Area of Impairment: Safety/judgement, Awareness, Problem solving ?  ?  ?  ?  ?  ?  ?  ?  ?  ?  ?  ?  ?Safety/Judgement: Decreased awareness of safety, Decreased awareness of deficits ?Awareness: Emergent ?Problem Solving: Slow processing, Requires verbal cues ?General Comments: pt oriented x 4, follows commands with increased time but demonstrates decreased problem solving and awareness to deficits ?  ?  ?General Comments  poor awareness to posterior lean ? ?  ?Exercises   ?  ?Shoulder Instructions    ? ? ?Home Living Family/patient expects to be discharged to:: Private residence ?Living Arrangements: Spouse/significant other;Children ?Available Help at Discharge: Family;Available 24 hours/day ?Type of Home: House ?Home Access: Stairs to enter ?Entrance Stairs-Number of Steps: 3 ?Entrance Stairs-Rails: Right ?Home Layout: Two level;Able to live on main level with bedroom/bathroom ?  ?   ?Bathroom Shower/Tub: Walk-in shower ?  ?Bathroom Toilet: Standard ?  ?  ?Home Equipment: Conservation officer, nature (2 wheels);Cane - single point;Shower seat;BSC/3in1;Grab bars - tub/shower ?  ?  ?  ? ?  ?Prior Functioning/Environment Prior Level of Function : Independent/Modified Independent;Driving ?  ?  ?  ?  ?  ?  ?Mobility Comments: independent mobility ?ADLs Comments: independent ADLs, light IADLs, minimal driving, manages meds ?  ? ?  ?  ?OT Problem List: Decreased strength;Decreased activity tolerance;Impaired balance (sitting and/or standing);Decreased safety awareness;Decreased cognition;Decreased knowledge of use of DME or AE;Decreased knowledge of precautions ?  ?   ?OT Treatment/Interventions: Self-care/ADL training;Therapeutic exercise;DME and/or AE instruction;Therapeutic activities;Patient/family education;Balance training;Cognitive remediation/compensation  ?  ?OT Goals(Current goals can be found in the care plan section) Acute Rehab OT Goals ?Patient Stated Goal: get better ?OT Goal Formulation: With patient ?Time For Goal Achievement: 06/05/21 ?Potential to Achieve Goals: Good  ?OT Frequency: Min 2X/week ?  ? ?Co-evaluation   ?  ?  ?  ?  ? ?  ?AM-PAC OT "6 Clicks" Daily Activity     ?Outcome Measure Help from another person eating meals?: Total (NPO) ?Help from another person taking care of personal grooming?: A Little ?Help from another person toileting, which includes using toliet, bedpan, or urinal?: A Lot ?Help from another person bathing (including washing, rinsing, drying)?: A Lot ?Help from another person to put on and taking off regular upper body clothing?: A Little ?Help from another person to put on and taking off regular  lower body clothing?: A Lot ?6 Click Score: 13 ?  ?End of Session Equipment Utilized During Treatment: Gait belt ?Nurse Communication: Mobility status ? ?Activity Tolerance: Patient tolerated treatment well ?Patient left: with call bell/phone within reach;in bed ? ?OT Visit  Diagnosis: Other abnormalities of gait and mobility (R26.89);Muscle weakness (generalized) (M62.81);Other symptoms and signs involving the nervous system (R29.898)  ?              ?Time: 7471-8550 ?OT Time Calcul

## 2021-05-22 NOTE — Progress Notes (Signed)
ANTICOAGULATION CONSULT NOTE - Follow Up Consult ? ?Pharmacy Consult for Warfarin ?Indication:  Bioprosthetic AVR ? ?Allergies  ?Allergen Reactions  ? Sulfa Antibiotics Other (See Comments)  ?  Granulocytosis  ? Lisinopril   ?  Other reaction(s): cough  ? ? ?Patient Measurements: ?Height: 6' (182.9 cm) ?Weight: 81.6 kg (180 lb) ?IBW/kg (Calculated) : 77.6 ? ?Vital Signs: ?Temp: 99.9 ?F (37.7 ?C) (05/15 0755) ?Temp Source: Oral (05/15 0755) ?BP: 133/72 (05/15 0800) ?Pulse Rate: 92 (05/15 0800) ? ?Labs: ?Recent Labs  ?  05/21/21 ?1300 05/21/21 ?1356 05/22/21 ?0714  ?HGB 12.3* 12.9*  --   ?HCT 37.7* 38.0*  --   ?PLT 126*  --   --   ?APTT 49*  --   --   ?LABPROT 20.7*  --  19.5*  ?INR 1.8*  --  1.7*  ?CREATININE 0.94 0.90  --   ? ? ?Estimated Creatinine Clearance: 68.3 mL/min (by C-G formula based on SCr of 0.9 mg/dL). ? ? ?Medications:  ?(Not in a hospital admission)  ? ?Assessment: ?86yo M who presents with facial droop, weakness and dysarthria. Brain imaging showed acute to subacute left frontal lobe infarct. Patient is on warfarin at home for h/o bioprosthetic AVR. Pharmacy consulted to continue warfarin therapy.  ? ?Last dose of warfarin was 05/20/21 ?Home warfarin regimen: 7.5 mg on Monday, 5 mg on all other days  ? ?INR 1.7, subtherapeutic ?No s/sx of bleeding ? ?Goal of Therapy:  ?INR 2-3 ?Monitor platelets by anticoagulation protocol: Yes ?  ?Plan:  ?Warfarin 7.'5mg'$  po x 1 ?Daily PT/INR, CBC ?Continue to monitor for s/sx of bleeding and clinical progress ? ?Kaleen Mask ?05/22/2021,9:01 AM ? ? ?

## 2021-05-22 NOTE — Evaluation (Signed)
Speech Language Pathology Evaluation ?Patient Details ?Name: [name]. ?MRN: [id number] ?DOB: 1935/05/03 ?[name] Date: 05/22/2021 ?Time: 1000-1030 ?SLP Time Calculation (min) (ACUTE ONLY): 30 min ? ?Problem List:  ?Patient Active Problem List  ? Diagnosis Date Noted  ? Acute CVA (cerebrovascular accident) ([place]) 05/21/2021  ? SIRS (systemic inflammatory response syndrome) ([place]) 05/21/2021  ? Generalized weakness 05/21/2021  ? Otitis externa 05/21/2021  ? CAD (coronary artery disease) 12/19/2020  ? Malnutrition of moderate degree 08/02/2020  ? S/P left total hip arthroplasty 08/02/2020  ? Closed left hip fracture, initial encounter ([place]) 08/01/2020  ? Chronic congestive heart failure ([place]) 05/26/2020  ? Elevated PSA 05/26/2020  ? History of adenomatous polyp of colon 05/26/2020  ? History of anemia 05/26/2020  ? Hypercoagulable state ([place]) 05/26/2020  ? Impairment of balance 05/26/2020  ? Peripheral neuropathy 05/26/2020  ? Personal history of other malignant neoplasm of skin 05/26/2020  ? Prediabetes 05/26/2020  ? Presence of prosthetic heart valve 05/26/2020  ? Sleep disorder 05/26/2020  ? Thrombocytopenia ([place]) 05/26/2020  ? Vitamin D deficiency 05/26/2020  ? Acquired dilation of ascending aorta and aortic root (HCC)   ? Symptomatic anemia 09/05/2018  ? Anemia due to chronic blood loss 09/05/2018  ? Long term current use of anticoagulant 02/10/2018  ? s/p mitral valve repair 01/30/2018  ? S/P tricuspid valve repair 01/30/2018  ? S/P Maze operation for atrial fibrillation 01/30/2018  ? Tricuspid regurgitation   ? Mitral valve regurgitation 10/14/2017  ? History of cerebrovascular accident 11/27/2016  ? Permanent atrial fibrillation ([place])   ? Hyperglycemia   ? Right middle cerebral artery stroke ([place]) 10/29/2016  ? Acute embolic stroke ([place])   ? Hypokalemia   ? Left hemiparesis ([place])   ? Dysphagia, post-stroke   ? S/P AVR (aortic valve replacement)   ? Benign prostatic hyperplasia with lower urinary tract  symptoms   ? CVA (cerebral vascular accident) ([place]) 10/24/2016  ? Essential hypertension   ? Gout   ? History of small bowel obstruction   ? History of BPH   ? S/P AVR 08/03/2010  ? Hypertrophy of prostate with urinary obstruction and other lower urinary tract symptoms (LUTS) 05/21/2001  ? ?Past Medical History:  ?Past Medical History:  ?Diagnosis Date  ? Acquired dilation of ascending aorta and aortic root (HCC)   ? 39m aortic root and 446mascending aorta on echo 03/2020  ? Bladder stones   ? Borderline diabetes   ? BPH (benign prostatic hyperplasia)   ? CAD (coronary artery disease) 12/19/2020  ? Non-obstructive coronary artery disease at cath in 2019  ? Coronary artery disease   ? cardiologist-  dr skNat Matherhart NP--- per cath 06-02-2010 non-obstructive cad pLAD 30-40%  ? Diverticulosis of colon   ? Dysrhythmia   ? afib  ? Gout   ? Heart murmur   ? History of adenomatous polyp of colon   ? 2002-- tubular adenoma  ? History of aortic insufficiency   ? severe -- s/p  AVR 08-03-2010  ? History of small bowel obstruction   ? 02/ 2007 mechanical sbo s/p  surgical intervention;  partial sbo 09/ 2011 and 03-20-2011 resolved without surgical intervention  ? History of urinary retention   ? HTN (hypertension)   ? Peripheral neuropathy   ? Persistent atrial fibrillation ([place]  ? S/P aortic valve replacement with prosthetic valve 08/03/2010  ? tissue valve  ? S/P Maze operation for atrial fibrillation 01/30/2018  ? Complete bilateral atrial lesion  set using bipolar radiofrequency and cryothermy with clipping of LA appendage  ? S/P MVR (mitral valve repair) 01/30/2018  ? Complex valvuloplasty including artificial Gore-tex neochord placement x4 and [employer] Annuloflex ring annuloplasty, size 28  ? S/P patent foramen ovale closure 08/03/2010  ? at same time AVR  ? S/P tricuspid valve repair 01/30/2018  ? Using an MC3 Annuloplasty ring, size 28  ? Stroke [facility])   ? Tricuspid regurgitation   ? ?Past Surgical History:   ?Past Surgical History:  ?Procedure Laterality Date  ? BIOPSY  09/07/2018  ? Procedure: BIOPSY;  Surgeon: Otis Brace, MD;  Location: [facility];  Service: Gastroenterology;;  ? CARDIAC CATHETERIZATION  06-02-2010  dr Marlou Porch  ? non-obstructive cad- pLAD 30-40%/  normal LVSF/  severe AI  ? CARDIOVASCULAR STRESS TEST  04/12/2016  ? Low risk nuclear perfusion study w/ no significant reversible ischemia/  normal LV function and wall motion ,  stress ef 60%/  65m inferior and lateral scooped ST-segment depression w/ exercise (may be repolarization abnormality), exercise capacity was moderately reduced  ? CATARACT EXTRACTION W/ INTRAOCULAR LENS  IMPLANT, BILATERAL  02/2010  ? CLIPPING OF ATRIAL APPENDAGE N/A 01/30/2018  ? Procedure: CLIPPING OF LEFT ATRIAL APPENDAGE USING ATRICLIP PRO2 45MM;  Surgeon: ORexene Alberts MD;  [place]  Service: Open Heart Surgery;  Laterality: N/A;  ? COLONOSCOPY WITH PROPOFOL N/A 10/21/2018  ? Procedure: COLONOSCOPY WITH PROPOFOL;  Surgeon: BOtis Brace MD;  [facility];  Service: Gastroenterology;  Laterality: N/A;  ? CYSTOSCOPY WITH LITHOLAPAXY N/A 06/05/2016  ? Procedure: CYSTOSCOPY WITH LITHOLAPAXY and fulgarization of bladder neck;  Surgeon: WIrine Seal MD;  [facility]  Service: [facility];  Laterality: N/A;  ? ESOPHAGOGASTRODUODENOSCOPY (EGD) WITH PROPOFOL N/A 09/07/2018  ? Procedure: ESOPHAGOGASTRODUODENOSCOPY (EGD) WITH PROPOFOL;  Surgeon: BOtis Brace MD;  [facility];  Service: Gastroenterology;  Laterality: N/A;  ? EXPLORATORY LAPARTOMY /  CHOLECYSTECTOMY  02/28/2005  ? for Small  bowel obstruction (mechnical)  ? IR ANGIO EXTRACRAN SEL COM CAROTID INNOMINATE UNI L MOD SED  10/24/2016  ? IR ANGIO VERTEBRAL SEL SUBCLAVIAN INNOMINATE BILAT MOD SED  10/24/2016  ? IR PERCUTANEOUS ART THROMBECTOMY/INFUSION INTRACRANIAL INC DIAG ANGIO  10/24/2016  ? IR RADIOLOGIST EVAL & MGMT  12/05/2016  ? LEFT KNEE ARTHROSCOPY   2006  ? MAZE N/A 01/30/2018  ? Procedure: MAZE;  Surgeon: ORexene Alberts MD;  [place]  Service: Open Heart Surgery;  Laterality: N/A;  ? MITRAL VALVE REPAIR N/A 01/30/2018  ? Procedure: MITRAL VALVE REPAIR (MVR) USING CARBOMEDICS ANNULOFLEX SIZE 28;  Surgeon: ORexene Alberts MD;  [place]  Service: Open Heart Surgery;  Laterality: N/A;  ? POLYPECTOMY  10/21/2018  ? Procedure: POLYPECTOMY;  Surgeon: BOtis Brace MD;  [facility];  Service: Gastroenterology;;  ? RADIOLOGY WITH ANESTHESIA N/A 10/24/2016  ? Procedure: RADIOLOGY WITH ANESTHESIA;  Surgeon: DLuanne Bras MD;  [place]  Service: Radiology;  Laterality: N/A;  ? RIGHT FOOT SURGERY    ? RIGHT MINIATURE ANTERIOR THORACOTOMY FOR AORTIC VALVE REPLACEMENT AND CLOSURE PATENT FORAMEN OVALE  08-03-2010  DR ORoxy Manns ? Edwards Magna-ease pericardial tissue valve (242m  ? RIGHT/LEFT HEART CATH AND CORONARY ANGIOGRAPHY N/A 10/14/2017  ? Procedure: RIGHT/LEFT HEART CATH AND CORONARY ANGIOGRAPHY;  Surgeon: CoSherren MochaMD;  [place] [facility];  Service: Cardiovascular;  Laterality: N/A;  ? TEE WITHOUT CARDIOVERSION N/A 10/14/2017  ? Procedure: TRANSESOPHAGEAL ECHOCARDIOGRAM (TEE);  Surgeon: NiJohnsie Cancel  Wallis Bamberg, MD;  [facility];  Service: Cardiovascular;  Laterality: N/A;  ? TOTAL HIP ARTHROPLASTY Left 08/02/2020  ? Procedure: TOTAL HIP ARTHROPLASTY ANTERIOR APPROACH;  Surgeon: Paralee Cancel, MD;  [facility];  Service: Orthopedics;  Laterality: Left;  ? TRANSTHORACIC ECHOCARDIOGRAM  05/30/2016  dr Marlou Porch  ? moderate  LVH ef 60-65%/  bioprothesis aortic valve present ,normal grandient and no AI /  mild MV calcification , moderate MR /  mild PR/ moderate TR/  PASP 73mHg/ (RA denisty was identified 04-27-2016 echo) and is seen again today, this is likely a promient eustacian ridge, atrium is normal size  ? TRICUSPID VALVE REPLACEMENT N/A 01/30/2018  ? Procedure: TRICUSPID VALVE REPAIR USING MC3 SIZE 28;   Surgeon: ORexene Alberts MD;  [place]  Service: Open Heart Surgery;  Laterality: N/A;  ? ?HPI:  ?86year old with history of weakness and slurred speech with history of previous CVA, HTN, afi

## 2021-05-22 NOTE — Progress Notes (Signed)
PHARMACY - PHYSICIAN COMMUNICATION ?CRITICAL VALUE ALERT - BLOOD CULTURE IDENTIFICATION (BCID) ? ?[name]. is an 86 y.o. male who presented to [facility] on 05/21/2021 with a chief complaint of L sided facial droop and slurred speech ? ?Assessment:  2/4 bottles (1 set) with GPC in clusters; BCID detected Staphylococcus species ? ?Name of physician (or Provider) Contacted: Dr. Manuella Ghazi ? ?Current antibiotics: Augmentin ? ?Changes to prescribed antibiotics recommended:  ?Patient is on recommended antibiotics - No changes needed ?Likely that coagulase-negative species in 1 set is a contaminant. No additional antibiotics needed at this time. Will monitor closely given hx of bioprosthetic AVR. ? ? ?[name], PharmD ?PGY2 Infectious Diseases Pharmacy Resident ?  ?Please check [email or web address] for unit-specific pharmacy phone numbers  ? ?

## 2021-05-22 NOTE — Progress Notes (Signed)
?PROGRESS NOTE ? ? ? ?Gerald Amas.  [id number]:[id number] DOB: 08/21/1935 DOA: 05/21/2021 ?PCP: Marda Stalker, PA-C ? ? ?Brief Narrative:  ?Per HPI: ?Gerald Tackitt. is a 86 y.o. male with medical history significant of BPH, non obstructive CAD by [facility] in 2019, atrial fibrillation s/p MAZE, s/p AVR 7/20212 secondary to aortic insufficiency, hx of CVA, HTN, hx of MVR with complaints of generalized weakness.  He states he tried to put his clothes on and couldn't get up from the floor due to weakness. His son had to help him get up. His wife thought this was very abnormal for him and called EMS. He also states he has had some dizziness and lightheadedness with position changes. Wife states she also thought his speech was more slurred and he had a left facial droop.  ? ?-Patient was admitted with concerns for acute CVA and brain MRI does demonstrate acute/subacute left frontal lobe infarct.  Per neurology, patient is to continue his usual use of Coumadin has CTA head and neck does not demonstrate any large vessel occlusion.  CVA is quite small and facial deficits are chronic.  LDL noted to be 77 and A1c is pending.  Allow for permissive hypertension at this time.  OT evaluation with recommendations for likely need of SNF on discharge.  ? ? ?Assessment & Plan: ?  ?Principal Problem: ?  Acute CVA (cerebrovascular accident) ([place]) ?Active Problems: ?  SIRS (systemic inflammatory response syndrome) (HCC) ?  Generalized weakness ?  Essential hypertension ?  Otitis externa ?  Permanent atrial fibrillation ([place]) ?  CAD (coronary artery disease) ?  S/P AVR ?  s/p mitral valve repair ?  Anemia due to chronic blood loss ?  Benign prostatic hyperplasia with lower urinary tract symptoms ?  Thrombocytopenia ([place]) ? ?Assessment and Plan: ?* Acute CVA (cerebrovascular accident) ([place]) ?86 year old with history of weakness and slurred speech with history of previous CVA, HTN, afib on coumadin found to have new very small acute  left frontal lobe infarct.  ?-place in observation on telemetry for stroke work-up ?-Neurochecks per protocol ?-Neurology consulted, but did not feel like they would need to officially see patient unless CTA head/neck show new abnormal finding as stroke is small and left facial deficits from previous CVA  ?-CTA head/neck pending  ?-echo recently done in 03/2021  ?-a1c/lipid panel pending. On no statin, will star this. -crestor 8m/day  ?-on coumadin-subtherapeutic  ?-Permissive hypertension first 24 hours <220/110 ?-N.p.o. until bedside swallow screen ?-PT/ OT/ SLP consult ? ? ?SIRS (systemic inflammatory response syndrome) (HCC) ?During hospital work up for stroke found to have fever to 101 and met SIRS criteria with WBC and HR 92.  ?-CXR with no acute finding ?-covid/flu negative  ?-blood cultures obtained ?-right ear with otitis externa, treating with cipro dex  ?-urine is positive for nitrites. Will add on culture and give 1 dose of fosfomycin  ?-also has complaints of tooth pain and concern for abscess. On exam do not see obvious abscess. Will start augmentin  ?-he is not altered and INR 1.8, not best candidate for LP ?-no abdominal complaints.  ?-trend fever curve and cbc  ? ?Generalized weakness ?Could be multifactorial ?-new CVA, albeit very small  ?-SIRS/sepsis ?-also complains of dizziness/lightheadedness with standing, will check orthostatics, hold BP meds and flomax and will continue gentle IVF  ?-PT/OT to eval  ? ?Otitis externa ?Right ear. Continue ciprodex ?TM pearly with light reflex  ? ?Essential hypertension ?Allow for permissive  HTN in setting of acute CVA ?Home medication on hold: lisinopril 70m, atenolol 12.522mand norvasc 1025mPrn hydralazine as needed  ? ?CAD (coronary artery disease) ?Non obstructive CAD by [email or web address]  ?Hold atenolol in setting of acute CVA  ? ?Permanent atrial fibrillation ([place]CC[place] Springs[place] in 2020 ?Rate controlled, continue coumadin per pharmacy dosing  ?INR 1.8 today,  recommend not taking glucosamine, can interfere with coumadin  ? ?S/P AVR ?S/p bioprosthetic AVR in 2012.   ?-Normally functioning bioprosthetic AVR by echocardiogram in March 2023.   ?-Continue SBE prophylaxis. ? ?s/p mitral valve repair ?Normally functioning mitral valve repair and tricuspid valve repair by echocardiogram March 2023 ? ?Anemia due to chronic blood loss ?Stable at baseline ?Followed by hematology with low clinical suspicion for malignancy ?Continue iron ? ?Benign prostatic hyperplasia with lower urinary tract symptoms ?Continue proscar, hold flomax until orthostatics result  ? ?Thrombocytopenia (HCCSanfordAt baseline, continue to monitor  ? ? ? ?DVT prophylaxis: Coumadin ?Code Status: DNR ?Family Communication: Tried calling wife with no response ?Disposition Plan:  ?Status is: Inpatient ?Remains inpatient appropriate because: Need for IV medications and need for rehab placement. ? ?Consultants:  ?Case discussed with Neurology per EDP ? ?Procedures:  ?See below ? ?Antimicrobials:  ?Anti-infectives (From admission, onward)  ? ? Start     Dose/Rate Route Frequency Ordered Stop  ? 05/21/21 2200  amoxicillin-clavulanate (AUGMENTIN) 875-125 MG per tablet 1 tablet       ? 1 tablet Oral Every 12 hours 05/21/21 1835 05/31/21 2159  ? 05/21/21 1845  fosfomycin (MONUROL) packet 3 g       ? 3 g Oral  Once 05/21/21 1835 05/21/21 2007  ? 05/21/21 1830  metroNIDAZOLE (FLAGYL) IVPB 500 mg  Status:  Discontinued       ? 500 mg ?100 mL/hr over 60 Minutes Intravenous Every 12 hours 05/21/21 1823 05/21/21 1830  ? 05/21/21 1830  cefTRIAXone (ROCEPHIN) 1 g in sodium chloride 0.9 % 100 mL IVPB  Status:  Discontinued       ? 1 g ?200 mL/hr over 30 Minutes Intravenous  Once 05/21/21 1825 05/21/21 1835  ? ?  ? ? ?Subjective: ?Patient seen and evaluated today with no new acute complaints or concerns. No acute concerns or events noted overnight. ? ?Objective: ?Vitals:  ? 05/22/21 0900 05/22/21 1000 05/22/21 1100 05/22/21 1200   ?BP: 130/79 (!) 146/85 (!) 147/87 130/74  ?Pulse: (!) 104 87 88 92  ?Resp: 20 (!) 21 19 (!) 22  ?Temp:      ?TempSrc:      ?SpO2: 98% 95% 93% 93%  ?Weight:      ?Height:      ? ? ?Intake/Output Summary (Last 24 hours) at 05/22/2021 1213 ?Last data filed at 05/21/2021 2003 ?Gross per 24 hour  ?Intake 1000 ml  ?Output --  ?Net 1000 ml  ? ?Filed Weights  ? 05/21/21 1302  ?Weight: 81.6 kg  ? ? ?Examination: ? ?General exam: Appears calm and comfortable  ?Respiratory system: Clear to auscultation. Respiratory effort normal. ?Cardiovascular system: S1 & S2 heard, RRR.  ?Gastrointestinal system: Abdomen is soft ?Central nervous system: Alert and awake ?Extremities: No edema ?Skin: No significant lesions noted; right ear and dressings C/D/I ?Psychiatry: Flat affect. ? ? ? ?Data Reviewed: I have personally reviewed following labs and imaging studies ? ?CBC: ?Recent Labs  ?Lab 05/21/21 ?1300 05/21/21 ?1356  ?WBC 12.7*  --   ?NEUTROABS 10.5*  --   ?HGB 12.3* 12.9*  ?HCT 37.7*  38.0*  ?MCV 99.2  --   ?PLT 126*  --   ? ?Basic Metabolic Panel: ?Recent Labs  ?Lab 05/21/21 ?1300 05/21/21 ?1356  ?NA 138 139  ?K 3.7 3.7  ?CL 108 104  ?CO2 22  --   ?GLUCOSE 145* 150*  ?BUN 13 13  ?CREATININE 0.94 0.90  ?CALCIUM 9.0  --   ? ?GFR: ?Estimated Creatinine Clearance: 68.3 mL/min (by C-G formula based on SCr of 0.9 mg/dL). ?Liver Function Tests: ?Recent Labs  ?Lab 05/21/21 ?1300  ?AST 23  ?ALT 15  ?ALKPHOS 52  ?BILITOT 1.6*  ?PROT 7.2  ?ALBUMIN 3.5  ? ?No results for input(s): LIPASE, AMYLASE in the last 168 hours. ?No results for input(s): AMMONIA in the last 168 hours. ?Coagulation Profile: ?Recent Labs  ?Lab 05/21/21 ?1300 05/22/21 ?0714  ?INR 1.8* 1.7*  ? ?Cardiac Enzymes: ?No results for input(s): CKTOTAL, CKMB, CKMBINDEX, TROPONINI in the last 168 hours. ?BNP (last 3 results) ?No results for input(s): PROBNP in the last 8760 hours. ?HbA1C: ?No results for input(s): HGBA1C in the last 72 hours. ?CBG: ?No results for input(s): GLUCAP in  the last 168 hours. ?Lipid Profile: ?Recent Labs  ?  05/22/21 ?0713  ?CHOL 130  ?HDL 43  ?LDLCALC 77  ?TRIG 49  ?CHOLHDL 3.0  ? ?Thyroid Function Tests: ?No results for input(s): TSH, T4TOTAL, FREET4, T3FREE, THYR

## 2021-05-22 NOTE — Progress Notes (Signed)
? ?  Inpatient Rehab Admissions Coordinator : ? ?Per therapy recommendations, patient was screened for CIR candidacy by Dream Harman RN MSN.  At this time patient appears to be a potential candidate for CIR. I will place a rehab consult per protocol for full assessment. Please call me with any questions. ? ?Kenyata Napier RN MSN ?Admissions Coordinator ?[phone] ?  ?

## 2021-05-23 ENCOUNTER — Inpatient Hospital Stay (HOSPITAL_COMMUNITY): Payer: Medicare Other

## 2021-05-23 DIAGNOSIS — I639 Cerebral infarction, unspecified: Secondary | ICD-10-CM | POA: Diagnosis not present

## 2021-05-23 LAB — HEMOGLOBIN A1C
Hgb A1c MFr Bld: 5.7 % — ABNORMAL HIGH (ref 4.8–5.6)
Mean Plasma Glucose: 116.89 mg/dL

## 2021-05-23 LAB — CBC
HCT: 33.6 % — ABNORMAL LOW (ref 39.0–52.0)
Hemoglobin: 11.7 g/dL — ABNORMAL LOW (ref 13.0–17.0)
MCH: 33.1 pg (ref 26.0–34.0)
MCHC: 34.8 g/dL (ref 30.0–36.0)
MCV: 95.2 fL (ref 80.0–100.0)
Platelets: 113 10*3/uL — ABNORMAL LOW (ref 150–400)
RBC: 3.53 MIL/uL — ABNORMAL LOW (ref 4.22–5.81)
RDW: 13.5 % (ref 11.5–15.5)
WBC: 12 10*3/uL — ABNORMAL HIGH (ref 4.0–10.5)
nRBC: 0 % (ref 0.0–0.2)

## 2021-05-23 LAB — BASIC METABOLIC PANEL
Anion gap: 6 (ref 5–15)
BUN: 11 mg/dL (ref 8–23)
CO2: 23 mmol/L (ref 22–32)
Calcium: 8.7 mg/dL — ABNORMAL LOW (ref 8.9–10.3)
Chloride: 106 mmol/L (ref 98–111)
Creatinine, Ser: 0.93 mg/dL (ref 0.61–1.24)
GFR, Estimated: >60 mL/min (ref >60)
Glucose, Bld: 125 mg/dL — ABNORMAL HIGH (ref 70–99)
Potassium: 3.5 mmol/L (ref 3.5–5.1)
Sodium: 135 mmol/L (ref 135–145)

## 2021-05-23 LAB — PROTIME-INR
INR: 1.7 — ABNORMAL HIGH (ref 0.8–1.2)
Prothrombin Time: 20.2 seconds — ABNORMAL HIGH (ref 11.4–15.2)

## 2021-05-23 LAB — HEPARIN LEVEL (UNFRACTIONATED): Heparin Unfractionated: 0.11 IU/mL — ABNORMAL LOW (ref 0.30–0.70)

## 2021-05-23 MED ORDER — PANTOPRAZOLE SODIUM 40 MG PO TBEC
40.0000 mg | DELAYED_RELEASE_TABLET | Freq: Every day | ORAL | Status: DC
  Administered 2021-05-23 – 2021-05-24 (×2): 40 mg via ORAL
  Filled 2021-05-23 (×2): qty 1

## 2021-05-23 MED ORDER — WARFARIN SODIUM 7.5 MG PO TABS
7.5000 mg | ORAL_TABLET | Freq: Once | ORAL | Status: AC
  Administered 2021-05-23: 7.5 mg via ORAL
  Filled 2021-05-23: qty 1

## 2021-05-23 MED ORDER — TAMSULOSIN HCL 0.4 MG PO CAPS
0.4000 mg | ORAL_CAPSULE | Freq: Every day | ORAL | Status: DC
  Administered 2021-05-23 – 2021-05-24 (×2): 0.4 mg via ORAL
  Filled 2021-05-23 (×2): qty 1

## 2021-05-23 MED ORDER — PIPERACILLIN-TAZOBACTAM 3.375 G IVPB
3.3750 g | Freq: Three times a day (TID) | INTRAVENOUS | Status: DC
  Administered 2021-05-23 – 2021-05-24 (×3): 3.375 g via INTRAVENOUS
  Filled 2021-05-23 (×3): qty 50

## 2021-05-23 MED ORDER — HEPARIN (PORCINE) 25000 UT/250ML-% IV SOLN
1200.0000 [IU]/h | INTRAVENOUS | Status: DC
  Administered 2021-05-23: 1050 [IU]/h via INTRAVENOUS
  Filled 2021-05-23: qty 250

## 2021-05-23 NOTE — Progress Notes (Signed)
ANTICOAGULATION CONSULT NOTE - Follow Up Consult ? ?Pharmacy Consult for Heparin bridge to Warfarin ?Indication:  Bioprosthetic AVR ? ?Allergies  ?Allergen Reactions  ? Sulfa Antibiotics Other (See Comments)  ?  Granulocytosis  ? Lisinopril   ?  Other reaction(s): cough  ? ? ?Patient Measurements: ?Height: 6' (182.9 cm) ?Weight: 81.6 kg (180 lb) ?IBW/kg (Calculated) : 77.6 ? ?Vital Signs: ?Temp: 100.1 ?F (37.8 ?C) (05/16 1949) ?Temp Source: Oral (05/16 1949) ?BP: 142/66 (05/16 1949) ?Pulse Rate: 71 (05/16 1949) ? ?Labs: ?Recent Labs  ?  05/21/21 ?1300 05/21/21 ?1356 05/22/21 ?0714 05/23/21 ?0204 05/23/21 ?2105  ?HGB 12.3* 12.9*  --  11.7*  --   ?HCT 37.7* 38.0*  --  33.6*  --   ?PLT 126*  --   --  113*  --   ?APTT 49*  --   --   --   --   ?LABPROT 20.7*  --  19.5* 20.2*  --   ?INR 1.8*  --  1.7* 1.7*  --   ?HEPARINUNFRC  --   --   --   --  0.11*  ?CREATININE 0.94 0.90  --  0.93  --   ? ? ? ?Estimated Creatinine Clearance: 66.1 mL/min (by C-G formula based on SCr of 0.93 mg/dL). ? ? ?Medications:  ?Medications Prior to Admission  ?Medication Sig Dispense Refill Last Dose  ? allopurinol (ZYLOPRIM) 300 MG tablet Take 150 mg by mouth every morning.   05/20/2021  ? amLODipine (NORVASC) 10 MG tablet TAKE 1 TABLET BY MOUTH  DAILY (Patient taking differently: Take 10 mg by mouth daily.) 90 tablet 2 05/20/2021  ? atenolol (TENORMIN) 25 MG tablet TAKE ONE-HALF TABLET BY  MOUTH IN THE MORNING (Patient taking differently: Take 12.5 mg by mouth daily.) 45 tablet 3 05/20/2021 at 0830  ? Cinnamon 500 MG capsule Take 500 mg by mouth every morning.   05/20/2021  ? ferrous sulfate 325 (65 FE) MG EC tablet Take 325 mg by mouth 2 (two) times daily.   05/20/2021  ? finasteride (PROSCAR) 5 MG tablet Take 1 tablet (5 mg total) by mouth daily. 30 tablet 0 05/20/2021  ? gabapentin (NEURONTIN) 100 MG capsule Take 200 mg by mouth at bedtime.   05/20/2021  ? Glucos-Chondroit-Collag-Hyal (GLUCOSAMINE CHONDROIT-COLLAGEN PO) Take 1 tablet by mouth 2  (two) times daily.   05/20/2021  ? Multiple Vitamins-Minerals (CENTRUM SILVER ADULT 50+) TABS Take by mouth.   05/20/2021  ? tamsulosin (FLOMAX) 0.4 MG CAPS capsule Take 2 capsules (0.8 mg total) by mouth daily after breakfast. 30 capsule 0 05/20/2021  ? traZODone (DESYREL) 50 MG tablet Take 50 mg by mouth at bedtime as needed.   Past Month  ? warfarin (COUMADIN) 5 MG tablet TAKE 1 TO 1 AND 1/2 TABLETS BY MOUTH DAILY OR AS  DIRECTED BY ANTICOAGULATION CLINIC (Patient taking differently: Take 5-7.5 mg by mouth See admin instructions. Take 5 mg ( 1 tablet) every day except Monday. On Monday, take 7.5 mg (1.5 tablets).) 135 tablet 3 05/20/2021  ? amoxicillin (AMOXIL) 500 MG capsule Take 2,000 mg by mouth See admin instructions. Take 4 capsules (2000 mg) by mouth 1 hour prior to dental appointment (Patient not taking: Reported on 01/17/2021)   Completed Course  ? lisinopril (ZESTRIL) 10 MG tablet TAKE 1 TABLET BY MOUTH  DAILY (Patient taking differently: Take 10 mg by mouth daily.) 90 tablet 2   ? ? ?Assessment: ?86yo M who presents with facial droop, weakness and dysarthria. Brain imaging showed  acute to subacute left frontal lobe infarct. Patient is on warfarin at home for h/o bioprosthetic AVR, MVR repair/MAZE procedure. Pharmacy consulted to heparin bridge to warfarin therapy.  ? ?Heparin level subtherapeutic (0.11) on infusion at 1050 units/hr. No issues with line or bleeding reported per RN. ? ?Goal of Therapy:  ?INR 2-3; heparin level 0.3-0.5 units/ml ?Monitor platelets by anticoagulation protocol: Yes ?  ?Plan:  ?Increase heparin to 1200 units/hr ?F/u 8 hr heparin level ? ?Sherlon Handing, PharmD, BCPS ?Please see amion for complete clinical pharmacist phone list ?05/23/2021 9:58 PM ? ? ?

## 2021-05-23 NOTE — Progress Notes (Addendum)
?PROGRESS NOTE ? ? ? ?[name].  WUJ:[id number] DOB: Feb 18, 1935 DOA: 05/21/2021 ?PCP: Marda Stalker, PA-C ? ? ?Brief Narrative:  ?No notes on file  ? ? ?Assessment & Plan: ?  ?Principal Problem: ?  Acute CVA (cerebrovascular accident) ([place]) ?Active Problems: ?  SIRS (systemic inflammatory response syndrome) (HCC) ?  Generalized weakness ?  Essential hypertension ?  Otitis externa ?  Permanent atrial fibrillation ([place]) ?  CAD (coronary artery disease) ?  S/P AVR ?  s/p mitral valve repair ?  Anemia due to chronic blood loss ?  Benign prostatic hyperplasia with lower urinary tract symptoms ?  Thrombocytopenia ([place]) ? ?Assessment and Plan: ? ?Acute CVA (cerebrovascular accident) [facility])  - 86 year old with history of weakness and slurred speech with history of previous CVA, HTN, afib on coumadin found to have new very small acute left frontal lobe infarct on MRI, neurology will be consulted, LDL was above goal and will be placed on Crestor, A1c ordered, recent echo from March 2023 noted, CTA head and neck nonacute, neurology consulted.  Further work-up per neurology.  Note he is quite deconditioned and weak and may require placement.  At baseline he is on Coumadin with subtherapeutic INR will bridge with heparin. ? ?SIRS (systemic inflammatory response syndrome) ([place]) - During hospital work up for stroke found to have fever to 101 and met SIRS criteria with WBC and HR 92.  He did have some toothache earlier and he was empirically placed on Augmentin by previous MD >> Zosyn to cover Ott[place] externa as well, UA appears unremarkable, 1 out of 2 blood cultures contaminated with staph hominis.  Will check orthopantogram to rule out tooth abscess.  He also had right this externa for which he is on Cipro ointment.  Continue to monitor.  Currently stable no sepsis at this time. ? ? ?Otitis externa - Right ear. Continue ciprodex, TM pearly with light reflex, monitor. ? ?Essential hypertension - Allow for permissive  HTN in setting of acute CVA, home medication on hold: lisinopril 48m, atenolol 12.558mand norvasc 1071m Prn hydralazine as needed. ? ?CAD (coronary artery disease) - Non obstructive CAD by [email or web address], resume home dose beta-blocker in the next 1 to 2 days. ? ?Permanent atrial fibrillation (HCC) with ChaMalis 2 score of greater than 5.  S/p maze procedure in 2022, currently rate controlled, beta-blocker to be resumed in the next 1 to 2 days if blood pressure allows in the setting of CVA, INR subtherapeutic, heparin bridge.  He has been counseled not to take his glucosamine which can interfere with his Coumadin activity. ? ?S/P AVR - S/p bioprosthetic AVR in 2012, Normally functioning bioprosthetic AVR by TEE done in March 2023.  Outpatient follow-up with his cardiologist. ? ?s/p mitral valve repair - Normally functioning mitral valve repair and tricuspid valve repair by echocardiogram March 2023 ? ?Anemia due to chronic blood loss - Stable at baseline, Followed by hematology with low clinical suspicion for malignancy, Continue iron at PPI. ? ?Benign prostatic hyperplasia with lower urinary tract symptoms - Continue proscar and Flomax.   ? ?Mild chronic thrombocytopenia (HCC)  - At baseline, continue to monitor  ? ? ? ?DVT prophylaxis: Coumadin ? ?Lab Results  ?Component Value Date  ? INR 1.7 (H) 05/23/2021  ? INR 1.7 (H) 05/22/2021  ? INR 1.8 (H) 05/21/2021  ? ?Lab Results  ?Component Value Date  ? PLT 113 (L) 05/23/2021  ? ? ? ? ?Code Status: DNR ?Family  Communication: None present ?Disposition Plan:  ?Status is: Inpatient ?Remains inpatient appropriate because: Need for IV medications and need for rehab placement. ? ?Consultants:  ?Neuro ? ?Procedures:  ? ?MRI - 1. Punctate acute to subacute left frontal lobe infarct. 2. Chronic ischemia including a moderately large chronic right MCA infarct. ? ?CTA - 1. Negative CTA for large vessel occlusion or other emergent finding. 2. Moderate diffuse stenosis involving the mid  right P2 segment. 3. Additional mild for age atheromatous change elsewhere about the major arterial vascular of the head and neck. No other proximal high-grade or correctable stenosis. 4. Diffuse arterial vascular tortuosity, suggesting chronic underlying hypertension. ? ? ?TTE March 2023 -  ? 1. Left ventricular ejection fraction, by estimation, is 60 to 65%. Left  ?ventricular ejection fraction by PLAX is 78 %. The left ventricle has  ?normal function. The left ventricle has no regional wall motion  ?abnormalities. There is mild concentric left  ?ventricular hypertrophy. Left ventricular diastolic parameters are  ?indeterminate.  ? 2. Right ventricular systolic function is normal. The right ventricular  ?size is normal. There is moderately elevated pulmonary artery systolic  ?pressure.  ? 3. Left atrial size was severely dilated.  ? 4. Compared with the echo 03/2020, mean gradient across the repaired  ?mitral valve has increased from 6 mmHg to 9 mmHg. The mitral valve has  ?been repaired/replaced. Trivial mitral valve regurgitation. No evidence of  ?mitral stenosis. There is a 28 mm Sorin  ? [employer] Annuloflex ring prosthetic annuloplasty ring present in the  ?mitral position. Procedure Date: 01/30/2018.  ? 5. Tricuspid valve annuloplasty ring. The tricuspid valve is has been  ?repaired/replaced.  ? 6. Unchanged from echo 03/2020. The aortic valve is normal in structure.  ?Aortic valve regurgitation is not visualized. No aortic stenosis is  ?present. There is a 25 mm [employer] Ease valve present in the aortic  ?position. Procedure Date: 08/03/2010. Echo  ?findings are consistent with normal structure and function of the aortic  ?valve prosthesis.  ? 7. Aortic dilatation noted. There is mild dilatation of the aortic root,  ?measuring 41 mm. There is mild dilatation of the ascending aorta,  ?measuring 36 mm.  ? 8. The inferior vena cava is normal in size with greater than 50%  ?respiratory variability, suggesting  right atrial pressure of 3 mmHg. ? ? ? ?Antimicrobials:  ? ?Subjective:  Patient in bed, appears comfortable, denies any headache, no fever, no chest pain or pressure, no shortness of breath , no abdominal pain. No new focal weakness. ? ? ?Objective: ?Vitals:  ? 05/22/21 1810 05/22/21 1951 05/23/21 0408 05/23/21 0825  ?BP: (!) 159/85 (!) 155/93 137/69 (!) 143/87  ?Pulse: 96 (!) 53 86 86  ?Resp: 20 16  19   ?Temp: 99.7 ?F (37.6 ?C) 98.4 ?F (36.9 ?C) 98.1 ?F (36.7 ?C) 98.8 ?F (37.1 ?C)  ?TempSrc: Oral Oral Oral Oral  ?SpO2: 94% 95% 95% 97%  ?Weight:      ?Height:      ? ? ?Intake/Output Summary (Last 24 hours) at 05/23/2021 1124 ?Last data filed at 05/23/2021 1123 ?Gross per 24 hour  ?Intake 811.1 ml  ?Output 750 ml  ?Net 61.1 ml  ? ?Filed Weights  ? 05/21/21 1302  ?Weight: 81.6 kg  ? ? ?Examination: ? ?Awake Alert, No new F.N deficits, Normal affect ?[place].AT,PERRAL, R ear lobe red ?Supple Neck, No JVD,   ?Symmetrical Chest wall movement, Good air movement bilaterally, CTAB ?RRR,No Gallops, Rubs or new Murmurs,  ?+  ve B.Sounds, Abd Soft, No tenderness,   ?No Cyanosis, Clubbing or edema  ? ? ? ? ?Data Reviewed: I have personally reviewed following labs and imaging studies ? ?Recent Labs  ?Lab 05/21/21 ?1300 05/21/21 ?1356 05/23/21 ?0204  ?WBC 12.7*  --  12.0*  ?HGB 12.3* 12.9* 11.7*  ?HCT 37.7* 38.0* 33.6*  ?PLT 126*  --  113*  ?MCV 99.2  --  95.2  ?MCH 32.4  --  33.1  ?MCHC 32.6  --  34.8  ?RDW 13.5  --  13.5  ?LYMPHSABS 0.8  --   --   ?MONOABS 1.4*  --   --   ?EOSABS 0.0  --   --   ?BASOSABS 0.0  --   --   ? ? ?Recent Labs  ?Lab 05/21/21 ?1300 05/21/21 ?1356 05/21/21 ?1505 05/21/21 ?1705 05/22/21 ?3202 05/23/21 ?3343  ?NA 138 139  --   --   --  135  ?K 3.7 3.7  --   --   --  3.5  ?CL 108 104  --   --   --  106  ?CO2 22  --   --   --   --  23  ?GLUCOSE 145* 150*  --   --   --  125*  ?BUN 13 13  --   --   --  11  ?CREATININE 0.94 0.90  --   --   --  0.93  ?CALCIUM 9.0  --   --   --   --  8.7*  ?AST 23  --   --   --   --   --    ?ALT 15  --   --   --   --   --   ?ALKPHOS 52  --   --   --   --   --   ?BILITOT 1.6*  --   --   --   --   --   ?ALBUMIN 3.5  --   --   --   --   --   ?LATICACIDVEN  --   --  1.3 1.2  --   --   ?INR

## 2021-05-23 NOTE — Progress Notes (Signed)
Pharmacy Antibiotic Note ? ?[name]. is a 86 y.o. male admitted on 05/21/2021 with aspiration pneumonia.  Pharmacy has been consulted for Zosyn dosing. ? ?Plan: ?Zosyn 3.375g IV q8h (4 hour infusion). ?Monitor clinical progress, cultures/sensitivities, renal function, abx plan ? ? ?Height: 6' (182.9 cm) ?Weight: 81.6 kg (180 lb) ?IBW/kg (Calculated) : 77.6 ? ?Temp (24hrs), Avg:98.8 ?F (37.1 ?C), Min:98.1 ?F (36.7 ?C), Max:99.7 ?F (37.6 ?C) ? ?Recent Labs  ?Lab 05/21/21 ?1300 05/21/21 ?1356 05/21/21 ?1505 05/21/21 ?1705 05/23/21 ?0204  ?WBC 12.7*  --   --   --  12.0*  ?CREATININE 0.94 0.90  --   --  0.93  ?LATICACIDVEN  --   --  1.3 1.2  --   ?  ?Estimated Creatinine Clearance: 66.1 mL/min (by C-G formula based on SCr of 0.93 mg/dL).   ? ?Allergies  ?Allergen Reactions  ? Sulfa Antibiotics Other (See Comments)  ?  Granulocytosis  ? Lisinopril   ?  Other reaction(s): cough  ? ? ?Antimicrobials this admission: ?5/14 Fosfomycin x 1 ?5/14 Ciprodex >> (5/21) ?5/14 Augmentin >> 5/16 ?5/16 Zosyn >> ? ?Dose adjustments this admission: ? ?Microbiology results: ?5/14 BCx: BCID 2/4 w/CONS-likely contaminant ? ? ?Thank you for allowing [place] to participate in this patients care. ?Jens Som, PharmD ?05/23/2021 12:23 PM ? ?**Pharmacist phone directory can be found on [email or web address] listed under [place]** ? ?

## 2021-05-23 NOTE — Progress Notes (Signed)
Physical Therapy Treatment ?Patient Details ?Name: [name]. ?MRN: [id number] ?DOB: 1935/08/14 ?[name] Date: 05/23/2021 ? ? ?History of Present Illness Pt is a 86 y/o male presenting on 5/14 with generalized weakness. MRI + punctate acute to subacute L frontal lobe infarct and with R ear otitis externa. PMH includes: CAD, afib s/p MAZE, s/p AVR due to aortic insufficiency, CVA, HTN, THA. ? ?  ?PT Comments  ? ? Pt has made notable progress toward goals, but is still uncoordinated L>R and shows balance deficits with a posterior bias.  Emphasis on transitions, scooting, sit to stand with safety/UE assist, pre-gait/balance activity in standing and progression of gait stability in the RW and use of the available rail ?  ?Recommendations for follow up therapy are one component of a multi-disciplinary discharge planning process, led by the attending physician.  Recommendations may be updated based on patient status, additional functional criteria and insurance authorization. ? ?Follow Up Recommendations ? Acute inpatient rehab (3hours/day) (if he is accepted and goes imminently o/w outpatient) ?  ?  ?Assistance Recommended at Discharge Frequent or constant Supervision/Assistance  ?Patient can return home with the following A little help with walking and/or transfers;A little help with bathing/dressing/bathroom;Assistance with cooking/housework;Assist for transportation;Help with stairs or ramp for entrance ?  ?Equipment Recommendations ? None recommended by PT  ?  ?Recommendations for Other Services Rehab consult ? ? ?  ?Precautions / Restrictions Precautions ?Precautions: Fall ?Precaution Comments: R ear has serous drainage  ?  ? ?Mobility ? Bed Mobility ?Overal bed mobility: Needs Assistance ?Bed Mobility: Supine to Sit ?  ?  ?Supine to sit: Min assist ?  ?  ?General bed mobility comments: building momentum to come up and forward. ?  ? ?Transfers ?Overall transfer level: Needs assistance ?Equipment used: 1  person hand held assist, Rolling walker (2 wheels) ?Transfers: Sit to/from Stand ?Sit to Stand: Min assist, Mod assist ?  ?  ?  ?  ?  ?General transfer comment: Improvement with number of sit to stand trials and after balance work, but with posterior bias, mod initially with legs assisting stability against the bed frame, improving to stand without LE support against the bed with stability assist. ?  ? ?Ambulation/Gait ?Ambulation/Gait assistance: Min assist ?Gait Distance (Feet): 105 Feet (x2 and 100 feet approx with supported rest in standing to recover from muscle fatigue and degraded gait pattern.) ?Assistive device: Rolling walker (2 wheels) (rail) ?Gait Pattern/deviations: Step-through pattern ?  ?Gait velocity interpretation: <1.8 ft/sec, indicate of risk for recurrent falls ?  ?General Gait Details: mildly paretic gait on the L>R with use of the RW.  With cues and minimal truncal support, pt able to ambulate with minimal instability.  With use of the rail, gait pattern degraded with shorter step length on the L, mild instability in L stance.  Notable increased guardedness when having to release the rail.  Pt did not appear aware of the significant difference in RW vs rail. ? ? ?Stairs ?  ?  ?  ?  ?  ? ? ?Wheelchair Mobility ?  ? ?Modified Rankin (Stroke Patients Only) ?Modified Rankin (Stroke Patients Only) ?Pre-Morbid Rankin Score: No symptoms ?Modified Rankin: Moderately severe disability ? ? ?  ?Balance Overall balance assessment: Needs assistance ?  ?Sitting balance-Leahy Scale: Fair ?Sitting balance - Comments: pt shows a posterior bias which he can correct with cuing and no need for UE assist ?Postural control: Posterior lean ?Standing balance support: Bilateral upper extremity supported, During functional activity ?  Standing balance-Leahy Scale: Poor ?Standing balance comment: moderate posterior bias that remained after pregait, coordination and knee control activity. ?  ?  ?  ?  ?  ?  ?  ?  ?  ?  ?  ?   ? ?  ?Cognition Arousal/Alertness: Awake/alert ?Behavior During Therapy: [facility] for tasks assessed/performed ?Overall Cognitive Status:  (NT formally) ?  ?  ?  ?  ?  ?  ?  ?  ?  ?  ?  ?  ?  ?  ?  ?  ?General Comments: some impulsiveness, slower processing on rehab process. ?  ?  ? ?  ?Exercises   ? ?  ?General Comments   ?  ?  ? ?Pertinent Vitals/Pain Pain Assessment ?Pain Assessment: Faces ?Faces Pain Scale: Hurts a little bit ?Pain Location: R ear/jaw ?Pain Intervention(s): Monitored during session  ? ? ?Home Living   ?  ?  ?  ?  ?  ?  ?  ?  ?  ?   ?  ?Prior Function    ?  ?  ?   ? ?PT Goals (current goals can now be found in the care plan section) Acute Rehab PT Goals ?Patient Stated Goal: to walk and go home ?PT Goal Formulation: With patient ?Time For Goal Achievement: 06/05/21 ?Potential to Achieve Goals: Good ?Progress towards PT goals: Progressing toward goals ? ?  ?Frequency ? ? ? Min 4X/week ? ? ? ?  ?PT Plan Current plan remains appropriate  ? ? ?Co-evaluation   ?  ?  ?  ?  ? ?  ?AM-PAC PT "6 Clicks" Mobility   ?Outcome Measure ? Help needed turning from your back to your side while in a flat bed without using bedrails?: A Little ?Help needed moving from lying on your back to sitting on the side of a flat bed without using bedrails?: A Little ?Help needed moving to and from a bed to a chair (including a wheelchair)?: A Lot ?Help needed standing up from a chair using your arms (e.g., wheelchair or bedside chair)?: A Little ?Help needed to walk in hospital room?: A Little ?Help needed climbing 3-5 steps with a railing? : A Lot ?6 Click Score: 16 ? ?  ?End of Session Equipment Utilized During Treatment: Gait belt ?Activity Tolerance: Other (comment);Patient tolerated treatment well (muscle fatigue) ?Patient left: in bed;with call bell/phone within reach;with bed alarm set ?Nurse Communication: Mobility status ?PT Visit Diagnosis: Other abnormalities of gait and mobility (R26.89);Unsteadiness on feet  (R26.81);Muscle weakness (generalized) (M62.81);History of falling (Z91.81);Difficulty in walking, not elsewhere classified (R26.2) ?  ? ? ?Time: 6226-3335 ?PT Time Calculation (min) (ACUTE ONLY): 39 min ? ?Charges:  $Gait Training: 8-22 mins ?$Neuromuscular Re-education: 8-22 mins          ?          ? ?05/23/2021 ? ?Ginger Carne., PT ?Acute Rehabilitation Services ?[phone]  (pager) ?[phone]  (office) ? ? ?Tessie Fass Jheremy Boger ?05/23/2021, 5:18 PM ? ?

## 2021-05-23 NOTE — Progress Notes (Signed)
Inpatient Rehabilitation Admissions Coordinator  ? ?I met with patient and his wife at bedside. We discussed goals and expectations of a possible CIR admit. He was previously at [facility]  in 2018. He feels he may be doing well enough for direct discharge home therefore would like to see how he does with therapy today before deciding. I will follow up after therapy assessment. ? ?Danne Baxter, RN, MSN ?Rehab Admissions Coordinator ?[phone] ?05/23/2021 10:42 AM ? ?

## 2021-05-23 NOTE — TOC Initial Note (Signed)
Transition of Care (TOC) - Initial/Assessment Note  ? ? ?Patient Details  ?Name: [name]. ?MRN: [id number] ?Date of Birth: [birth year] ? ?Transition of Care (TOC) CM/SW Contact:    ?Pollie Friar, RN ?Phone Number: ?05/23/2021, 4:01 PM ? ?Clinical Narrative:                 ?Patient is from home with spouse. Recommendations are for CIR. Patient has been to CIR in the past. Awaiting therapy re-evals to see if he may be able to d/c home with therapies.  ?TOC following. ? ? ?Expected Discharge Plan: [place] ?Barriers to Discharge: Continued Medical Work up ? ? ?Patient Goals and CMS Choice ?  ?CMS Medicare.gov Compare Post Acute Care list provided to:: Patient ?Choice offered to / list presented to : Patient, Spouse ? ?Expected Discharge Plan and Services ?Expected Discharge Plan: [place] ?  ?Discharge Planning Services: CM Consult ?Post Acute Care Choice: IP Rehab ?Living arrangements for the past 2 months: [place] ?                ?  ?  ?  ?  ?  ?  ?  ?  ?  ?  ? ?Prior Living Arrangements/Services ?Living arrangements for the past 2 months: [place] ?Lives with:: Spouse ?Patient language and need for interpreter reviewed:: Yes ?       ?  ?  ?  ?Criminal Activity/Legal Involvement Pertinent to Current Situation/Hospitalization: No - Comment as needed ? ?Activities of Daily Living ?Home Assistive Devices/Equipment: None ?ADL Screening (condition at time of admission) ?Patient's cognitive ability adequate to safely complete daily activities?: Yes ?Is the patient deaf or have difficulty hearing?: No ?Does the patient have difficulty seeing, even when wearing glasses/contacts?: No ?Does the patient have difficulty concentrating, remembering, or making decisions?: No ?Patient able to express need for assistance with ADLs?: Yes ?Does the patient have difficulty dressing or bathing?: No ?Independently performs ADLs?: Yes (appropriate for developmental age) ?Does the patient  have difficulty walking or climbing stairs?: Yes ?Weakness of Legs: Both ?Weakness of Arms/Hands: None ? ?Permission Sought/Granted ?  ?  ?   ?   ?   ?   ? ?Emotional Assessment ?Appearance:: Appears stated age ?  ?  ?  ?  ?Psych Involvement: No (comment) ? ?Admission diagnosis:  Weakness [R53.1] ?Facial asymmetry [Q67.0] ?Fever in adult [R50.9] ?Acute CVA (cerebrovascular accident) ([place]) [I63.9] ?Cerebrovascular accident (CVA), unspecified mechanism ([place]) [I63.9] ?Acute otitis externa of right ear, unspecified type [H60.501] ?Patient Active Problem List  ? Diagnosis Date Noted  ? Acute CVA (cerebrovascular accident) ([place]) 05/21/2021  ? SIRS (systemic inflammatory response syndrome) ([place]) 05/21/2021  ? Generalized weakness 05/21/2021  ? Otitis externa 05/21/2021  ? CAD (coronary artery disease) 12/19/2020  ? Malnutrition of moderate degree 08/02/2020  ? S/P left total hip arthroplasty 08/02/2020  ? Closed left hip fracture, initial encounter ([place]) 08/01/2020  ? Chronic congestive heart failure ([place]) 05/26/2020  ? Elevated PSA 05/26/2020  ? History of adenomatous polyp of colon 05/26/2020  ? History of anemia 05/26/2020  ? Hypercoagulable state ([place]) 05/26/2020  ? Impairment of balance 05/26/2020  ? Peripheral neuropathy 05/26/2020  ? Personal history of other malignant neoplasm of skin 05/26/2020  ? Prediabetes 05/26/2020  ? Presence of prosthetic heart valve 05/26/2020  ? Sleep disorder 05/26/2020  ? Thrombocytopenia ([place]) 05/26/2020  ? Vitamin D deficiency 05/26/2020  ? Acquired dilation of ascending aorta and aortic  root ([place])   ? Symptomatic anemia 09/05/2018  ? Anemia due to chronic blood loss 09/05/2018  ? Long term current use of anticoagulant 02/10/2018  ? s/p mitral valve repair 01/30/2018  ? S/P tricuspid valve repair 01/30/2018  ? S/P Maze operation for atrial fibrillation 01/30/2018  ? Tricuspid regurgitation   ? Mitral valve regurgitation 10/14/2017  ? History of cerebrovascular accident 11/27/2016  ?  Permanent atrial fibrillation ([place])   ? Hyperglycemia   ? Right middle cerebral artery stroke ([place]) 10/29/2016  ? Acute embolic stroke ([place])   ? Hypokalemia   ? Left hemiparesis ([place])   ? Dysphagia, post-stroke   ? S/P AVR (aortic valve replacement)   ? Benign prostatic hyperplasia with lower urinary tract symptoms   ? CVA (cerebral vascular accident) ([place]) 10/24/2016  ? Essential hypertension   ? Gout   ? History of small bowel obstruction   ? History of BPH   ? S/P AVR 08/03/2010  ? Hypertrophy of prostate with urinary obstruction and other lower urinary tract symptoms (LUTS) 05/21/2001  ? ?PCP:  Marda Stalker, PA-C ?Pharmacy:   ?[facility] [address] [place] - [address] ?[address] ?[place] [address] ?Phone: [phone] Fax: [phone] ? ?[facility] ([place], [address] [place] ?[place] ?[address] ?[place] [address] ?Phone: [phone] Fax: [phone] ? ?[place] ([facility] ) - [place], [place] ?[address] ?[place] [address] ?Phone: [phone] Fax: [phone] ? ? ? ? ?Social Determinants of Health (SDOH) Interventions ?  ? ?Readmission Risk Interventions ?   ? View : No data to display.  ?  ?  ?  ? ? ? ?

## 2021-05-23 NOTE — Consult Note (Addendum)
?Neurology Consultation   ? ?Reason for Consult: Consideration of anticoagulation in a patient with recent incidental ischemic infarction  ? ?CC: Patient offers none at this time  ? ?HISTORY OF PRESENT ILLNESS   ?HPI  ?History is obtained from:patient.  ?Patient is an 86 year old gentleman with a past medical history of BPH, nonobstructive coronary artery disease, atrial fibrillation status post ablation, aortic valve repair July 2021 secondary to aortic insufficiency, gout, CHF, prediabetes, vitamin D deficiency, left hip fracture status post total hip arthroplasty, large right MCA territory infarction without much residual deficit (per patient), hypertension, mitral valve regurgitation. ? ?He presents on 05/21/2021 with generalized weakness, at which time he also had some dizziness and lightheadedness with position changes, slurred speech, and left facial droop; however, these 2 symptoms are deficits he has at baseline from his prior right MCA stroke. Etiology found to be sepsis, current suspected source being tooth abscess, currently being worked up. MRI brain was obtained, which showed a small left frontal DWI abnormality cortically with ?ADC correlate. Neurology consulted for guidance on anticoagulation. ? ?Patient currently offers no other complaints on our neurological ROS. ? ?ROS: Full ROS was performed and is negative except as noted in the HPI.   ? ?PAST MEDICAL HISTORY   ? ?Past Medical History:  ?Past Medical History:  ?Diagnosis Date  ? Acquired dilation of ascending aorta and aortic root (HCC)   ? 53m aortic root and 411mascending aorta on echo 03/2020  ? Bladder stones   ? Borderline diabetes   ? BPH (benign prostatic hyperplasia)   ? CAD (coronary artery disease) 12/19/2020  ? Non-obstructive coronary artery disease at cath in 2019  ? Coronary artery disease   ? cardiologist-  dr skNat Matherhart NP--- per cath 06-02-2010 non-obstructive cad pLAD 30-40%  ? Diverticulosis of colon   ?  Dysrhythmia   ? afib  ? Gout   ? Heart murmur   ? History of adenomatous polyp of colon   ? 2002-- tubular adenoma  ? History of aortic insufficiency   ? severe -- s/p  AVR 08-03-2010  ? History of small bowel obstruction   ? 02/ 2007 mechanical sbo s/p  surgical intervention;  partial sbo 09/ 2011 and 03-20-2011 resolved without surgical intervention  ? History of urinary retention   ? HTN (hypertension)   ? Peripheral neuropathy   ? Persistent atrial fibrillation ([facility]  ? S/P aortic valve replacement with prosthetic valve 08/03/2010  ? tissue valve  ? S/P Maze operation for atrial fibrillation 01/30/2018  ? Complete bilateral atrial lesion set using bipolar radiofrequency and cryothermy with clipping of LA appendage  ? S/P MVR (mitral valve repair) 01/30/2018  ? Complex valvuloplasty including artificial Gore-tex neochord placement x4 and [employer] Annuloflex ring annuloplasty, size 28  ? S/P patent foramen ovale closure 08/03/2010  ? at same time AVR  ? S/P tricuspid valve repair 01/30/2018  ? Using an MC3 Annuloplasty ring, size 28  ? [facility]  ? Tricuspid regurgitation   ? ? ?No family history on file. ?Family History  ?Problem Relation Age of Onset  ? Heart disease Mother   ? Brain cancer Father   ? ? ?Allergies:  ?Allergies  ?Allergen Reactions  ? Sulfa Antibiotics Other (See Comments)  ?  Granulocytosis  ? Lisinopril   ?  Other reaction(s): cough  ? ? ?Social History:  ? reports that he has never smoked. He has never used smokeless tobacco. He reports current alcohol  use. He reports that he does not use drugs.   ? ?Medications ?Medications Prior to Admission  ?Medication Sig Dispense Refill  ? allopurinol (ZYLOPRIM) 300 MG tablet Take 150 mg by mouth every morning.    ? amLODipine (NORVASC) 10 MG tablet TAKE 1 TABLET BY MOUTH  DAILY (Patient taking differently: Take 10 mg by mouth daily.) 90 tablet 2  ? atenolol (TENORMIN) 25 MG tablet TAKE ONE-HALF TABLET BY  MOUTH IN THE MORNING (Patient taking  differently: Take 12.5 mg by mouth daily.) 45 tablet 3  ? Cinnamon 500 MG capsule Take 500 mg by mouth every morning.    ? ferrous sulfate 325 (65 FE) MG EC tablet Take 325 mg by mouth 2 (two) times daily.    ? finasteride (PROSCAR) 5 MG tablet Take 1 tablet (5 mg total) by mouth daily. 30 tablet 0  ? gabapentin (NEURONTIN) 100 MG capsule Take 200 mg by mouth at bedtime.    ? Glucos-Chondroit-Collag-Hyal (GLUCOSAMINE CHONDROIT-COLLAGEN PO) Take 1 tablet by mouth 2 (two) times daily.    ? Multiple Vitamins-Minerals (CENTRUM SILVER ADULT 50+) TABS Take by mouth.    ? tamsulosin (FLOMAX) 0.4 MG CAPS capsule Take 2 capsules (0.8 mg total) by mouth daily after breakfast. 30 capsule 0  ? traZODone (DESYREL) 50 MG tablet Take 50 mg by mouth at bedtime as needed.    ? warfarin (COUMADIN) 5 MG tablet TAKE 1 TO 1 AND 1/2 TABLETS BY MOUTH DAILY OR AS  DIRECTED BY ANTICOAGULATION CLINIC (Patient taking differently: Take 5-7.5 mg by mouth See admin instructions. Take 5 mg ( 1 tablet) every day except Monday. On Monday, take 7.5 mg (1.5 tablets).) 135 tablet 3  ? amoxicillin (AMOXIL) 500 MG capsule Take 2,000 mg by mouth See admin instructions. Take 4 capsules (2000 mg) by mouth 1 hour prior to dental appointment (Patient not taking: Reported on 01/17/2021)    ? lisinopril (ZESTRIL) 10 MG tablet TAKE 1 TABLET BY MOUTH  DAILY (Patient taking differently: Take 10 mg by mouth daily.) 90 tablet 2  ? ? ?EXAMINATION   ? ?Current vital signs: ? ?  05/23/2021  ? 11:24 AM 05/23/2021  ?  8:25 AM 05/23/2021  ?  4:08 AM  ?Vitals with BMI  ?Systolic [phone]  ?Diastolic 80 87 69  ?Pulse 84 86 86  ? ?Examination: ?GENERAL: Awake, alert in NAD ?HEENT: - Normocephalic and atraumatic, dry mm, no lymphadenopathy, no Thyromegally ?LUNGS - Clear to auscultation bilaterally ?CV - S1S2 RRR, equal pulses bilaterally. ?ABDOMEN - Soft, nontender, nondistended with normoactive BS ?Ext: warm, well perfused, intact peripheral pulses, no pedal edema ? ?NEURO:   ?Mental Status: AA&Ox4 ?Language: speech is mildly dysarthric.  Intact naming, repetition, fluency, and comprehension. ?Cranial Nerves:  ?II: PERRL. Visual fields full to confrontation.  ?III, IV, VI: EOM intact. Eyelids elevate symmetrically. ?V: Sensation intact V1-3 symmetrically  ?VII: Equivocal/subtle left facial asymmetry   ?VIII: hearing intact to voice ?IX, X: Palate elevates symmetrically. Phonation is normal.  ?OZ:DGUYQIHK shrug 5/5 and symmetrical  ?XII: tongue is midline without fasciculations. ?Motor:  ?5/5 throughout BLUE/LE  ?Tone is normal and bulk is normal ?DTRs: Deferred ?Sensation- Intact to light touch, pin-prick bilaterally  ?Coordination: FTN intact bilaterally, no ataxia in BLE. no abnormal movements  ?Gait- deferred ? ?NIHSS 2 (baseline dysarthria and mild facial droop)  ? ?LABS  ?I have reviewed labs in epic and the results pertinent to this consultation are: ?Lab Results  ?Component Value Date  ?  [place] 77 05/22/2021  ? ?Lab Results  ?Component Value Date  ? ALT 15 05/21/2021  ? AST 23 05/21/2021  ? ALKPHOS 52 05/21/2021  ? BILITOT 1.6 (H) 05/21/2021  ? ?Lab Results  ?Component Value Date  ? HGBA1C 5.7 (H) 05/23/2021  ? ?Lab Results  ?Component Value Date  ? WBC 12.0 (H) 05/23/2021  ? HGB 11.7 (L) 05/23/2021  ? HCT 33.6 (L) 05/23/2021  ? MCV 95.2 05/23/2021  ? PLT 113 (L) 05/23/2021  ? ?Lab Results  ?Component Value Date  ? [id number] 467 01/17/2021  ? ?Lab Results  ?Component Value Date  ? FOLATE 24.1 09/05/2018  ? ?Lab Results  ?Component Value Date  ? NA 135 05/23/2021  ? K 3.5 05/23/2021  ? CL 106 05/23/2021  ? CO2 23 05/23/2021  ? ?DIAGNOSTIC IMAGING/PROCEDURES  ? ?I have reviewed the images obtained:, as below  ? ?CT-head:  ?Chronic right temporal lobe atrophy secondary to RMCA infarction. Moderate atrophy throughout ? ?CTA head and neck  ?1. Negative CTA for large vessel occlusion or other emergent ?finding. ?2. Moderate diffuse stenosis involving the mid right P2 segment. ?3.  Additional mild for age atheromatous change elsewhere about the ?major arterial vascular of the head and neck. No other proximal ?high-grade or correctable stenosis. ?4. Diffuse arterial vascular tortuosity, suggestin

## 2021-05-23 NOTE — Progress Notes (Addendum)
ADDENDUM: ?Pharmacy consulted for heparin bridge until INR therapeutic. See history below. Acute CVA this admit. ? ?Plan: ?Start Heparin 1050 units/hr ?Check 8 hour heparin level ?Monitor for bleeding ? ? ?ANTICOAGULATION CONSULT NOTE - Follow Up Consult ? ?Pharmacy Consult for Warfarin ?Indication:  Bioprosthetic AVR ? ?Allergies  ?Allergen Reactions  ? Sulfa Antibiotics Other (See Comments)  ?  Granulocytosis  ? Lisinopril   ?  Other reaction(s): cough  ? ? ?Patient Measurements: ?Height: 6' (182.9 cm) ?Weight: 81.6 kg (180 lb) ?IBW/kg (Calculated) : 77.6 ? ?Vital Signs: ?Temp: 98.8 ?F (37.1 ?C) (05/16 0825) ?Temp Source: Oral (05/16 0825) ?BP: 143/87 (05/16 0825) ?Pulse Rate: 86 (05/16 0825) ? ?Labs: ?Recent Labs  ?  05/21/21 ?1300 05/21/21 ?1356 05/22/21 ?0714 05/23/21 ?0204  ?HGB 12.3* 12.9*  --  11.7*  ?HCT 37.7* 38.0*  --  33.6*  ?PLT 126*  --   --  113*  ?APTT 49*  --   --   --   ?LABPROT 20.7*  --  19.5* 20.2*  ?INR 1.8*  --  1.7* 1.7*  ?CREATININE 0.94 0.90  --  0.93  ? ? ? ?Estimated Creatinine Clearance: 66.1 mL/min (by C-G formula based on SCr of 0.93 mg/dL). ? ? ?Medications:  ?Medications Prior to Admission  ?Medication Sig Dispense Refill Last Dose  ? allopurinol (ZYLOPRIM) 300 MG tablet Take 150 mg by mouth every morning.   05/20/2021  ? amLODipine (NORVASC) 10 MG tablet TAKE 1 TABLET BY MOUTH  DAILY (Patient taking differently: Take 10 mg by mouth daily.) 90 tablet 2 05/20/2021  ? atenolol (TENORMIN) 25 MG tablet TAKE ONE-HALF TABLET BY  MOUTH IN THE MORNING (Patient taking differently: Take 12.5 mg by mouth daily.) 45 tablet 3 05/20/2021 at 0830  ? Cinnamon 500 MG capsule Take 500 mg by mouth every morning.   05/20/2021  ? ferrous sulfate 325 (65 FE) MG EC tablet Take 325 mg by mouth 2 (two) times daily.   05/20/2021  ? finasteride (PROSCAR) 5 MG tablet Take 1 tablet (5 mg total) by mouth daily. 30 tablet 0 05/20/2021  ? gabapentin (NEURONTIN) 100 MG capsule Take 200 mg by mouth at bedtime.   05/20/2021   ? Glucos-Chondroit-Collag-Hyal (GLUCOSAMINE CHONDROIT-COLLAGEN PO) Take 1 tablet by mouth 2 (two) times daily.   05/20/2021  ? Multiple Vitamins-Minerals (CENTRUM SILVER ADULT 50+) TABS Take by mouth.   05/20/2021  ? tamsulosin (FLOMAX) 0.4 MG CAPS capsule Take 2 capsules (0.8 mg total) by mouth daily after breakfast. 30 capsule 0 05/20/2021  ? traZODone (DESYREL) 50 MG tablet Take 50 mg by mouth at bedtime as needed.   Past Month  ? warfarin (COUMADIN) 5 MG tablet TAKE 1 TO 1 AND 1/2 TABLETS BY MOUTH DAILY OR AS  DIRECTED BY ANTICOAGULATION CLINIC (Patient taking differently: Take 5-7.5 mg by mouth See admin instructions. Take 5 mg ( 1 tablet) every day except Monday. On Monday, take 7.5 mg (1.5 tablets).) 135 tablet 3 05/20/2021  ? amoxicillin (AMOXIL) 500 MG capsule Take 2,000 mg by mouth See admin instructions. Take 4 capsules (2000 mg) by mouth 1 hour prior to dental appointment (Patient not taking: Reported on 01/17/2021)   Completed Course  ? lisinopril (ZESTRIL) 10 MG tablet TAKE 1 TABLET BY MOUTH  DAILY (Patient taking differently: Take 10 mg by mouth daily.) 90 tablet 2   ? ? ?Assessment: ?86yo M who presents with facial droop, weakness and dysarthria. Brain imaging showed acute to subacute left frontal lobe infarct. Patient is  on warfarin at home for h/o bioprosthetic AVR, MVR repair/MAZE procedure. Pharmacy consulted to continue warfarin therapy.  ?Of note: Was on apixaban for afib in the past, changed to warfarin after MVR repair/MAZE procedure in Jan 2020 ? ?Last dose of warfarin was 05/20/21 (no missed doses) ?Home warfarin regimen: 7.5 mg on Monday, 5 mg on all other days  ? ?INR remains subtherapeutic at 1.7 today. CBC stable ?No s/sx of bleeding noted ? ?Goal of Therapy:  ?INR 2-3 ?Monitor platelets by anticoagulation protocol: Yes ?  ?Plan:  ?Repeat Warfarin 7.'5mg'$  po x 1 ?Monitor daily INR, CBC, PO intake/diet, Drug-Drug Interactions, clinical course, s/sx of bleed  ? ? ? ?Thank you for allowing [place] to  participate in this patients care. ?Jens Som, PharmD ?05/23/2021 9:34 AM ? ?**Pharmacist phone directory can be found on [email or web address] listed under [place]** ? ? ?

## 2021-05-24 LAB — COMPREHENSIVE METABOLIC PANEL
ALT: 18 U/L (ref 0–44)
AST: 21 U/L (ref 15–41)
Albumin: 2.9 g/dL — ABNORMAL LOW (ref 3.5–5.0)
Alkaline Phosphatase: 44 U/L (ref 38–126)
Anion gap: 7 (ref 5–15)
BUN: 17 mg/dL (ref 8–23)
CO2: 24 mmol/L (ref 22–32)
Calcium: 8.6 mg/dL — ABNORMAL LOW (ref 8.9–10.3)
Chloride: 108 mmol/L (ref 98–111)
Creatinine, Ser: 1.19 mg/dL (ref 0.61–1.24)
GFR, Estimated: >60 mL/min (ref >60)
Glucose, Bld: 113 mg/dL — ABNORMAL HIGH (ref 70–99)
Potassium: 3.6 mmol/L (ref 3.5–5.1)
Sodium: 139 mmol/L (ref 135–145)
Total Bilirubin: 0.8 mg/dL (ref 0.3–1.2)
Total Protein: 6.2 g/dL — ABNORMAL LOW (ref 6.5–8.1)

## 2021-05-24 LAB — CULTURE, BLOOD (ROUTINE X 2): Special Requests: ADEQUATE

## 2021-05-24 LAB — CBC
HCT: 32.6 % — ABNORMAL LOW (ref 39.0–52.0)
Hemoglobin: 11 g/dL — ABNORMAL LOW (ref 13.0–17.0)
MCH: 32.4 pg (ref 26.0–34.0)
MCHC: 33.7 g/dL (ref 30.0–36.0)
MCV: 95.9 fL (ref 80.0–100.0)
Platelets: 128 10*3/uL — ABNORMAL LOW (ref 150–400)
RBC: 3.4 MIL/uL — ABNORMAL LOW (ref 4.22–5.81)
RDW: 13.4 % (ref 11.5–15.5)
WBC: 8.1 10*3/uL (ref 4.0–10.5)
nRBC: 0 % (ref 0.0–0.2)

## 2021-05-24 LAB — URINE CULTURE: Culture: >=100000 — AB

## 2021-05-24 LAB — PROTIME-INR
INR: 2 — ABNORMAL HIGH (ref 0.8–1.2)
Prothrombin Time: 22.4 seconds — ABNORMAL HIGH (ref 11.4–15.2)

## 2021-05-24 LAB — MAGNESIUM: Magnesium: 2.2 mg/dL (ref 1.7–2.4)

## 2021-05-24 LAB — HEPARIN LEVEL (UNFRACTIONATED): Heparin Unfractionated: 0.23 IU/mL — ABNORMAL LOW (ref 0.30–0.70)

## 2021-05-24 MED ORDER — ROSUVASTATIN CALCIUM 10 MG PO TABS
10.0000 mg | ORAL_TABLET | Freq: Every day | ORAL | 0 refills | Status: DC
Start: 2021-05-25 — End: 2021-05-29

## 2021-05-24 MED ORDER — CIPROFLOXACIN-DEXAMETHASONE 0.3-0.1 % OT SUSP
4.0000 [drp] | Freq: Two times a day (BID) | OTIC | 0 refills | Status: DC
Start: 2021-05-24 — End: 2021-08-04

## 2021-05-24 MED ORDER — METRONIDAZOLE 500 MG PO TABS: 500.0000 mg | ORAL_TABLET | Freq: Two times a day (BID) | ORAL | 0 refills | Status: AC

## 2021-05-24 MED ORDER — LEVOFLOXACIN 500 MG PO TABS: 500.0000 mg | ORAL_TABLET | Freq: Every day | ORAL | 0 refills | Status: AC

## 2021-05-24 MED ORDER — ATENOLOL 25 MG PO TABS: 12.5000 mg | ORAL_TABLET | Freq: Every day | ORAL | Status: DC

## 2021-05-24 MED ORDER — WARFARIN SODIUM 5 MG PO TABS: 5.0000 mg | ORAL_TABLET | Freq: Every day | ORAL | 3 refills | Status: DC

## 2021-05-24 MED ORDER — ENSURE ENLIVE PO LIQD: 237.0000 mL | Freq: Two times a day (BID) | ORAL | 12 refills | Status: DC

## 2021-05-24 MED ORDER — WARFARIN SODIUM 7.5 MG PO TABS: 7.5000 mg | ORAL_TABLET | Freq: Once | ORAL | Status: DC

## 2021-05-24 NOTE — Progress Notes (Signed)
?  Inpatient Rehabilitation Admissions Coordinator  ? ?I spoke with patient at bedside. He prefers to go home with Home health, not to pursue Cir admit with insurance. I will alert acute team and TOC. ? ?Danne Baxter, RN, MSN ?Rehab Admissions Coordinator ?[phone] ?05/24/2021 8:23 AM ? ?

## 2021-05-24 NOTE — Progress Notes (Signed)
Physical Therapy Treatment ?Patient Details ?Name: Lois Ostrom. ?MRN: [id number] ?DOB: [birth year] ?[name] Date: 05/24/2021 ? ? ?History of Present Illness Pt is a 86 y/o male presenting on 5/14 with generalized weakness. MRI + punctate acute to subacute L frontal lobe infarct and with R ear otitis externa. PMH includes: CAD, afib s/p MAZE, s/p AVR due to aortic insufficiency, CVA, HTN, THA. ? ?  ?PT Comments  ? ? Excellent mobility progress noted. Pt required supervision bed mobility, min guard assist transfers, and min guard assist ambulation 350' with RW. Discharge recommendation updated to [facility]. ?   ?Recommendations for follow up therapy are one component of a multi-disciplinary discharge planning process, led by the attending physician.  Recommendations may be updated based on patient status, additional functional criteria and insurance authorization. ? ?Follow Up Recommendations ? Home health PT ?  ?  ?Assistance Recommended at Discharge Frequent or constant Supervision/Assistance  ?Patient can return home with the following A little help with walking and/or transfers;A little help with bathing/dressing/bathroom;Assistance with cooking/housework;Assist for transportation;Help with stairs or ramp for entrance ?  ?Equipment Recommendations ? Rolling walker (2 wheels)  ?  ?Recommendations for Other Services   ? ? ?  ?Precautions / Restrictions Precautions ?Precautions: Fall ?Precaution Comments: R ear has serous drainage ?Restrictions ?Weight Bearing Restrictions: No  ?  ? ?Mobility ? Bed Mobility ?Overal bed mobility: Needs Assistance ?Bed Mobility: Supine to Sit ?  ?  ?Supine to sit: Supervision, HOB elevated ?Sit to supine: Supervision ?  ?General bed mobility comments: +rail, increased time and effort ?  ? ?Transfers ?Overall transfer level: Needs assistance ?Equipment used: Rolling walker (2 wheels) ?Transfers: Sit to/from Stand ?Sit to Stand: Min guard ?  ?  ?  ?  ?  ?General transfer comment: min  guard for safety ?  ? ?Ambulation/Gait ?Ambulation/Gait assistance: Min guard ?Gait Distance (Feet): 350 Feet ?Assistive device: Rolling walker (2 wheels) ?Gait Pattern/deviations: Step-through pattern ?  ?Gait velocity interpretation: 1.31 - 2.62 ft/sec, indicative of limited community ambulator ?  ?General Gait Details: steady gait with RW ? ? ?Stairs ?  ?  ?  ?  ?  ? ? ?Wheelchair Mobility ?  ? ?Modified Rankin (Stroke Patients Only) ?Modified Rankin (Stroke Patients Only) ?Pre-Morbid Rankin Score: No symptoms ?Modified Rankin: Moderately severe disability ? ? ?  ?Balance Overall balance assessment: Needs assistance ?Sitting-balance support: No upper extremity supported, Feet supported ?Sitting balance-Leahy Scale: Fair ?  ?  ?Standing balance support: Bilateral upper extremity supported, No upper extremity supported, During functional activity ?Standing balance-Leahy Scale: Fair ?Standing balance comment: static stand without UE support. RW for amb. ?  ?  ?  ?  ?  ?  ?  ?  ?  ?  ?  ?  ? ?  ?Cognition Arousal/Alertness: Awake/alert ?Behavior During Therapy: [facility] for tasks assessed/performed ?Overall Cognitive Status: Within Functional Limits for tasks assessed ?  ?  ?  ?  ?  ?  ?  ?  ?  ?  ?  ?  ?  ?  ?  ?  ?  ?  ?  ? ?  ?Exercises   ? ?  ?General Comments   ?  ?  ? ?Pertinent Vitals/Pain Pain Assessment ?Pain Assessment: No/denies pain  ? ? ?Home Living   ?  ?  ?  ?  ?  ?  ?  ?  ?  ?   ?  ?Prior Function    ?  ?  ?   ? ?  PT Goals (current goals can now be found in the care plan section) Acute Rehab PT Goals ?Patient Stated Goal: home ?Progress towards PT goals: Progressing toward goals ? ?  ?Frequency ? ? ? Min 4X/week ? ? ? ?  ?PT Plan Discharge plan needs to be updated;Equipment recommendations need to be updated  ? ? ?Co-evaluation   ?  ?  ?  ?  ? ?  ?AM-PAC PT "6 Clicks" Mobility   ?Outcome Measure ? Help needed turning from your back to your side while in a flat bed without using bedrails?: None ?Help needed  moving from lying on your back to sitting on the side of a flat bed without using bedrails?: A Little ?Help needed moving to and from a bed to a chair (including a wheelchair)?: A Little ?Help needed standing up from a chair using your arms (e.g., wheelchair or bedside chair)?: A Little ?Help needed to walk in hospital room?: A Little ?Help needed climbing 3-5 steps with a railing? : A Little ?6 Click Score: 19 ? ?  ?End of Session Equipment Utilized During Treatment: Gait belt ?Activity Tolerance: Patient tolerated treatment well ?Patient left: in bed;with call bell/phone within reach;with bed alarm set ?Nurse Communication: Mobility status ?PT Visit Diagnosis: Other abnormalities of gait and mobility (R26.89);Unsteadiness on feet (R26.81);Muscle weakness (generalized) (M62.81);History of falling (Z91.81);Difficulty in walking, not elsewhere classified (R26.2) ?  ? ? ?Time: 1140-1205 ?PT Time Calculation (min) (ACUTE ONLY): 25 min ? ?Charges:  $Gait Training: 23-37 mins          ?          ? ?Lorrin Goodell, PT  ?Office # [phone] ?Pager [phone] ? ? ? ?Lorriane Shire ?05/24/2021, 12:16 PM ? ?

## 2021-05-24 NOTE — Care Management Important Message (Signed)
Important Message ? ?Patient Details  ?Name: [name]. ?MRN: [id number] ?Date of Birth: 08/18/1935 ? ? ?Medicare Important Message Given:  Yes ? ? ? ? ?Kimra Kantor ?05/24/2021, 1:07 PM ?

## 2021-05-24 NOTE — Progress Notes (Signed)
ANTICOAGULATION CONSULT NOTE - Follow Up Consult ? ?Pharmacy Consult for Heparin bridge to Warfarin ?Indication:  Bioprosthetic AVR ? ?Allergies  ?Allergen Reactions  ? Sulfa Antibiotics Other (See Comments)  ?  Granulocytosis  ? Lisinopril   ?  Other reaction(s): cough  ? ? ?Patient Measurements: ?Height: 6' (182.9 cm) ?Weight: 81.6 kg (180 lb) ?IBW/kg (Calculated) : 77.6 ? ?Vital Signs: ?Temp: 100.1 ?F (37.8 ?C) (05/16 1949) ?Temp Source: Oral (05/16 1949) ?BP: 142/66 (05/16 1949) ?Pulse Rate: 71 (05/16 1949) ? ?Labs: ?Recent Labs  ?  05/21/21 ?1300 05/21/21 ?1356 05/22/21 ?0714 05/23/21 ?0204 05/23/21 ?2105 05/24/21 ?0603  ?HGB 12.3* 12.9*  --  11.7*  --  11.0*  ?HCT 37.7* 38.0*  --  33.6*  --  32.6*  ?PLT 126*  --   --  113*  --  128*  ?APTT 49*  --   --   --   --   --   ?LABPROT 20.7*  --  19.5* 20.2*  --  22.4*  ?INR 1.8*  --  1.7* 1.7*  --  2.0*  ?HEPARINUNFRC  --   --   --   --  0.11* 0.23*  ?CREATININE 0.94 0.90  --  0.93  --  1.19  ? ? ? ?Estimated Creatinine Clearance: 51.6 mL/min (by C-G formula based on SCr of 1.19 mg/dL). ? ? ?Medications:  ?Medications Prior to Admission  ?Medication Sig Dispense Refill Last Dose  ? allopurinol (ZYLOPRIM) 300 MG tablet Take 150 mg by mouth every morning.   05/20/2021  ? amLODipine (NORVASC) 10 MG tablet TAKE 1 TABLET BY MOUTH  DAILY (Patient taking differently: Take 10 mg by mouth daily.) 90 tablet 2 05/20/2021  ? atenolol (TENORMIN) 25 MG tablet TAKE ONE-HALF TABLET BY  MOUTH IN THE MORNING (Patient taking differently: Take 12.5 mg by mouth daily.) 45 tablet 3 05/20/2021 at 0830  ? Cinnamon 500 MG capsule Take 500 mg by mouth every morning.   05/20/2021  ? ferrous sulfate 325 (65 FE) MG EC tablet Take 325 mg by mouth 2 (two) times daily.   05/20/2021  ? finasteride (PROSCAR) 5 MG tablet Take 1 tablet (5 mg total) by mouth daily. 30 tablet 0 05/20/2021  ? gabapentin (NEURONTIN) 100 MG capsule Take 200 mg by mouth at bedtime.   05/20/2021  ? Glucos-Chondroit-Collag-Hyal  (GLUCOSAMINE CHONDROIT-COLLAGEN PO) Take 1 tablet by mouth 2 (two) times daily.   05/20/2021  ? Multiple Vitamins-Minerals (CENTRUM SILVER ADULT 50+) TABS Take by mouth.   05/20/2021  ? tamsulosin (FLOMAX) 0.4 MG CAPS capsule Take 2 capsules (0.8 mg total) by mouth daily after breakfast. 30 capsule 0 05/20/2021  ? traZODone (DESYREL) 50 MG tablet Take 50 mg by mouth at bedtime as needed.   Past Month  ? warfarin (COUMADIN) 5 MG tablet TAKE 1 TO 1 AND 1/2 TABLETS BY MOUTH DAILY OR AS  DIRECTED BY ANTICOAGULATION CLINIC (Patient taking differently: Take 5-7.5 mg by mouth See admin instructions. Take 5 mg ( 1 tablet) every day except Monday. On Monday, take 7.5 mg (1.5 tablets).) 135 tablet 3 05/20/2021  ? amoxicillin (AMOXIL) 500 MG capsule Take 2,000 mg by mouth See admin instructions. Take 4 capsules (2000 mg) by mouth 1 hour prior to dental appointment (Patient not taking: Reported on 01/17/2021)   Completed Course  ? lisinopril (ZESTRIL) 10 MG tablet TAKE 1 TABLET BY MOUTH  DAILY (Patient taking differently: Take 10 mg by mouth daily.) 90 tablet 2   ? ? ?Assessment: ?85yo  M who presents with facial droop, weakness and dysarthria. Brain imaging showed acute to subacute left frontal lobe infarct. Patient is on warfarin at home for h/o bioprosthetic AVR, MVR repair/MAZE procedure. Pharmacy consulted to heparin bridge to warfarin therapy.  ? ?Heparin level subtherapeutic (0.23) on infusion at 1200 units/hr. INR now therapeutic at 2. D/w Dr. Posey Pronto, will d/c heparin drip. PTA warfarin dose '5mg'$  daily except 7.'5mg'$  on Monday. INR 1.7>>2 today, will give higher dose one more day then likely decrease to home dose.  ? ?Goal of Therapy:  ?INR 2-3; heparin level 0.3-0.5 units/ml ?Monitor platelets by anticoagulation protocol: Yes ?  ?Plan:  ?D/C heparin drip ?Repeat warfarin 7.'5mg'$  x 1 today ?Daily INR ? ?Abisola Carrero A. Levada Dy, PharmD, BCPS, FNKF ?Clinical Pharmacist ?[place] ?Please utilize [facility] for appropriate phone number to reach  the unit pharmacist ([place]) ? ? ? ?

## 2021-05-24 NOTE — Progress Notes (Signed)
[name]. to be D/C'd home with home health per MD order. Discussed with the patient and wife and all questions fully answered.  ?Skin clean and intact without evidence of skin break down, no evidence of skin tears noted.  ?IV catheters discontinued intact. Site without signs and symptoms of complications. Dressing and pressure applied.  ?An After Visit Summary was printed and given to the patient. Patient stated he no longer needed a walker from the hospital. Returned back to [facility]. ?Patient escorted via [place], and D/C home via private auto.  ?[name]  ?05/24/2021 1:45 PM ?  ?   ?

## 2021-05-24 NOTE — Progress Notes (Signed)
Occupational Therapy Treatment ?Patient Details ?Name: [name]. ?MRN: [id number] ?DOB: [birth year] ?[name] Date: 05/24/2021 ? ? ?History of present illness Pt is a 86 y/o male presenting on 5/14 with generalized weakness. MRI + punctate acute to subacute L frontal lobe infarct and with R ear otitis externa. PMH includes: CAD, afib s/p MAZE, s/p AVR due to aortic insufficiency, CVA, HTN, THA. ?  ?OT comments ? Patient progressing well towards OT goals.  Patient completing transfers and mobility in room with min guard fading to supervision using RW.  Continues to require min cueing for hand placement and safety using RW.  Able to complete LB dressing with min guard, grooming at sink with min guard.  Eager for [employer] home and reports has good support from spouse and son.  Updated dc plan to HHOT at this time.  Will follow acutely.   ? ?Recommendations for follow up therapy are one component of a multi-disciplinary discharge planning process, led by the attending physician.  Recommendations may be updated based on patient status, additional functional criteria and insurance authorization. ?   ?Follow Up Recommendations ? Home health OT  ?  ?Assistance Recommended at Discharge Frequent or constant Supervision/Assistance  ?Patient can return home with the following ? A little help with walking and/or transfers;A little help with bathing/dressing/bathroom;Help with stairs or ramp for entrance;Assist for transportation;Assistance with cooking/housework ?  ?Equipment Recommendations ? BSC/3in1  ?  ?Recommendations for Other Services   ? ?  ?Precautions / Restrictions Precautions ?Precautions: Fall ?Precaution Comments: R ear has serous drainage ?Restrictions ?Weight Bearing Restrictions: No  ? ? ?  ? ?Mobility Bed Mobility ?  ?  ?  ?  ?  ?  ?  ?General bed mobility comments: OOB upon entry with PT ?  ? ?Transfers ?  ?  ?  ?  ?  ?  ?  ?  ?  ?  ?  ?  ?Balance Overall balance assessment: Needs assistance ?Sitting-balance  support: No upper extremity supported, Feet supported ?Sitting balance-Leahy Scale: Fair ?Sitting balance - Comments: mild unsteadiness dynamically with donning socks but able to self correct ?  ?Standing balance support: Bilateral upper extremity supported, No upper extremity supported, During functional activity ?Standing balance-Leahy Scale: Fair ?Standing balance comment: B UE support dynamically, min guard without UE support and mild sway during ADLs ?  ?  ?  ?  ?  ?  ?  ?  ?  ?  ?  ?   ? ?ADL either performed or assessed with clinical judgement  ? ?ADL Overall ADL's : Needs assistance/impaired ?  ?  ?Grooming: Supervision/safety;Standing ?  ?  ?  ?  ?  ?  ?  ?Lower Body Dressing: Min guard;Sit to/from stand ?Lower Body Dressing Details (indicate cue type and reason): donning socks, min guard sit to stand with cueing for hand placement ?Toilet Transfer: Min guard;Ambulation;Regular Toilet;Rolling walker (2 wheels) ?Toilet Transfer Details (indicate cue type and reason): cueing for hand placement and safety ?  ?  ?  ?  ?Functional mobility during ADLs: Min guard;Supervision/safety;Rolling walker (2 wheels) ?General ADL Comments: min guard fading to supervision for mobility using RW. ?  ? ?Extremity/Trunk Assessment   ?  ?  ?  ?  ?  ? ?Vision   ?  ?  ?Perception   ?  ?Praxis   ?  ? ?Cognition Arousal/Alertness: Awake/alert ?Behavior During Therapy: [facility] for tasks assessed/performed ?Overall Cognitive Status: Within Functional Limits for tasks assessed ?  ?  ?  ?  ?  ?  ?  ?  ?  ?  ?  ?  ?  ?  ?  ?  ?  General Comments: appears WFL, able to follow 3 step commands.  Min cueing for safety using RW. But antiicpate pt is near baseline. ?  ?  ?   ?Exercises   ? ?  ?Shoulder Instructions   ? ? ?  ?General Comments    ? ? ?Pertinent Vitals/ Pain       Pain Assessment ?Pain Assessment: Faces ?Faces Pain Scale: Hurts a little bit ?Pain Location: R ear/jaw ?Pain Intervention(s): Monitored during session ? ?Home Living   ?  ?   ?  ?  ?  ?  ?  ?  ?  ?  ?  ?  ?  ?  ?  ?  ?  ?  ? ?  ?Prior Functioning/Environment    ?  ?  ?  ?   ? ?Frequency ? Min 2X/week  ? ? ? ? ?  ?Progress Toward Goals ? ?OT Goals(current goals can now be found in the care plan section) ? Progress towards OT goals: Progressing toward goals ? ?Acute Rehab OT Goals ?Patient Stated Goal: home ?OT Goal Formulation: With patient ?Time For Goal Achievement: 06/05/21 ?Potential to Achieve Goals: Good  ?Plan Discharge plan needs to be updated;Frequency remains appropriate   ? ?Co-evaluation ? ? ?   ?  ?  ?  ?  ? ?  ?AM-PAC OT "6 Clicks" Daily Activity     ?Outcome Measure ? ? Help from another person eating meals?: A Little ?Help from another person taking care of personal grooming?: A Little ?Help from another person toileting, which includes using toliet, bedpan, or urinal?: A Little ?Help from another person bathing (including washing, rinsing, drying)?: A Little ?Help from another person to put on and taking off regular upper body clothing?: A Little ?Help from another person to put on and taking off regular lower body clothing?: A Little ?6 Click Score: 18 ? ?  ?End of Session Equipment Utilized During Treatment: Gait belt;Rolling walker (2 wheels) ? ?OT Visit Diagnosis: Other abnormalities of gait and mobility (R26.89);Muscle weakness (generalized) (M62.81);Other symptoms and signs involving the nervous system (R29.898) ?  ?Activity Tolerance Patient tolerated treatment well ?  ?Patient Left with call bell/phone within reach;Other (comment) (with PT) ?  ?Nurse Communication Mobility status ?  ? ?   ? ?Time: 9983-3825 ?OT Time Calculation (min): 9 min ? ?Charges: OT General Charges ?$OT Visit: 1 Visit ?OT Treatments ?$Self Care/Home Management : 8-22 mins ? ?Jolaine Artist, OT ?Acute Rehabilitation Services ?Pager [phone] ?Office [phone] ? ? ?Delight Stare ?05/24/2021, 11:49 AM ?

## 2021-05-24 NOTE — TOC Transition Note (Addendum)
Transition of Care (TOC) - CM/SW Discharge Note ? ? ?Patient Details  ?Name: [name]. ?MRN: [id number] ?Date of Birth: April 15, 1935 ? ?Transition of Care (TOC) CM/SW Contact:  ?Pollie Friar, RN ?Phone Number: ?05/24/2021, 10:43 AM ? ? ?Clinical Narrative:    ?Patient has decided to d/c home with home health services instead of CIR. He asked to use the same [facility] agency he used in the past. CM found his last [place] was with [facility]. CM has asked them to see him for [facility] PT/OT. Information on the AVS.  ?Pt has: cane, walker, shower seat at home.  ?Pt manages his own medications and denies any issues. He and wife drive.  ?Wife to provide transport home today.  ?[facility] has accepted but can not make a visit until Saturday 5/20. MD is aware.  ?Pt requesting a new walker as his is missing parts. CM has ordered through [facility] and will be delivered to the room.  ? ? ?Final next level of care: [place] ?Barriers to Discharge: No Barriers Identified ? ? ?Patient Goals and CMS Choice ?  ?CMS Medicare.gov Compare Post Acute Care list provided to:: Patient ?Choice offered to / list presented to : Patient ? ?Discharge Placement ?  ?           ?  ?  ?  ?  ? ?Discharge Plan and Services ?  ?Discharge Planning Services: CM Consult ?Post Acute Care Choice: IP Rehab          ?  ?  ?  ?  ?  ?HH Arranged: PT, OT ?[place] Agency: [facility] ([place]) ?Date HH Agency Contacted: 05/24/21 ?  ?  ? ?Social Determinants of Health (SDOH) Interventions ?  ? ? ?Readmission Risk Interventions ?   ? View : No data to display.  ?  ?  ?  ? ? ? ? ? ?

## 2021-05-26 LAB — CULTURE, BLOOD (ROUTINE X 2)
Culture: NO GROWTH
Special Requests: ADEQUATE

## 2021-05-26 NOTE — Discharge Summary (Signed)
Physician Discharge Summary   Patient: [name]. MRN: [id number] DOB: Feb 23, 1935  Admit date:     05/21/2021  Discharge date: 05/24/2021  Discharge Physician: Berle Mull  PCP: Marda Stalker, PA-C  Recommendations at discharge:  Follow up with PCP in 1 week  Discharge Diagnoses: Principal Problem:   Acute CVA (cerebrovascular accident) ([place]) Active Problems:   SIRS (systemic inflammatory response syndrome) (HCC)   Generalized weakness   Essential hypertension   Otitis externa   Permanent atrial fibrillation (HCC)   CAD (coronary artery disease)   S/P AVR   s/p mitral valve repair   Anemia due to chronic blood loss   Benign prostatic hyperplasia with lower urinary tract symptoms   Thrombocytopenia [facility])  Hospital Course: PMH of BPH, non obstructive CAD by LHC in 2019, atrial fibrillation s/p MAZE, s/p AVR 7/20212 secondary to aortic insufficiency, hx of CVA, HTN, hx of MVR with complaints of generalized weakness. -Patient was admitted with concerns for acute CVA and brain MRI does demonstrate acute/subacute left frontal lobe infarct.  Assessment and Plan: * Acute CVA (cerebrovascular accident) ([place]) 86 year old with history of weakness and slurred speech with history of previous CVA, HTN, afib on coumadin found to have new very small acute left frontal lobe infarct.  -MRI with left frontal lobe subacute/acute CVA noted -Neurochecks per protocol -Neurology consulted, but did not feel like they would need to officially see patient unless CTA head/neck show new abnormal finding as stroke is small and left facial deficits from previous CVA  -CTA head/neck with no LVO -echo recently done in 03/2021   SIRS (systemic inflammatory response syndrome) ([place]) Otitis externa During hospital work up for stroke found to have fever to 101 and met SIRS criteria with WBC and HR 92.  -CXR with no acute finding -covid/flu negative  -blood cultures obtained -right ear with  otitis externa, treating with cipro dex  -urine is positive for nitrites. Will add on culture and give 1 dose of fosfomycin  -also has complaints of tooth pain and concern for abscess. On exam do not see obvious abscess. Will treat with levaquin and recommendation for follow up with PCP/ENT and Dentist  Otitis externa Right ear. Continue ciprodex TM pearly with light reflex   Essential hypertension Allow for permissive HTN in setting of acute CVA Home medication lisinopril 8m, atenolol 12.561mand norvasc 1017mrn hydralazine as needed   CAD (coronary artery disease) Non obstructive CAD by LHC 2019  Hold atenolol in setting of acute CVA   Permanent atrial fibrillation (HCC) S/p MAZE in 2020 Rate controlled, continue coumadin per pharmacy dosing  INR was low, treated with IV heparin. Now therapeutic and pt will be on levaquin therefor we will continue 5 mg warfarin only.   S/P AVR S/p bioprosthetic AVR in 2012.   -Normally functioning bioprosthetic AVR by echocardiogram in March 2023.   -Continue SBE prophylaxis.  s/p mitral valve repair Normally functioning mitral valve repair and tricuspid valve repair by echocardiogram March 2023  Anemia due to chronic blood loss Stable at baseline Followed by hematology with low clinical suspicion for malignancy Continue iron  Benign prostatic hyperplasia with lower urinary tract symptoms Continue proscar, hold flomax until orthostatics result   Thrombocytopenia ([place] baseline, continue to monitor   Consultants: Neurology  Procedures performed:  none DISCHARGE MEDICATION: Allergies as of 05/24/2021       Reactions   Sulfa Antibiotics Other (See Comments)   Granulocytosis   Lisinopril  Other reaction(s): cough        Medication List     STOP taking these medications    lisinopril 10 MG tablet Commonly known as: ZESTRIL       TAKE these medications    allopurinol 300 MG tablet Commonly known as:  ZYLOPRIM Take 150 mg by mouth every morning.   amLODipine 10 MG tablet Commonly known as: NORVASC TAKE 1 TABLET BY MOUTH  DAILY   amoxicillin 500 MG capsule Commonly known as: AMOXIL Take 2,000 mg by mouth See admin instructions. Take 4 capsules (2000 mg) by mouth 1 hour prior to dental appointment   atenolol 25 MG tablet Commonly known as: TENORMIN Take 0.5 tablets (12.5 mg total) by mouth daily. What changed: See the new instructions.   Centrum Silver Adult 50+ Tabs Take by mouth.   Cinnamon 500 MG capsule Take 500 mg by mouth every morning.   ciprofloxacin-dexamethasone OTIC suspension Commonly known as: CIPRODEX Place 4 drops into the right ear 2 (two) times daily.   feeding supplement Liqd Take 237 mLs by mouth 2 (two) times daily between meals.   ferrous sulfate 325 (65 FE) MG EC tablet Take 325 mg by mouth 2 (two) times daily.   finasteride 5 MG tablet Commonly known as: PROSCAR Take 1 tablet (5 mg total) by mouth daily.   gabapentin 100 MG capsule Commonly known as: NEURONTIN Take 200 mg by mouth at bedtime.   GLUCOSAMINE CHONDROIT-COLLAGEN PO Take 1 tablet by mouth 2 (two) times daily.   levofloxacin 500 MG tablet Commonly known as: Levaquin Take 1 tablet (500 mg total) by mouth daily for 6 days.   metroNIDAZOLE 500 MG tablet Commonly known as: Flagyl Take 1 tablet (500 mg total) by mouth 2 (two) times daily for 7 days.   rosuvastatin 10 MG tablet Commonly known as: CRESTOR Take 1 tablet (10 mg total) by mouth daily.   tamsulosin 0.4 MG Caps capsule Commonly known as: FLOMAX Take 2 capsules (0.8 mg total) by mouth daily after breakfast.   traZODone 50 MG tablet Commonly known as: DESYREL Take 50 mg by mouth at bedtime as needed.   warfarin 5 MG tablet Commonly known as: COUMADIN Take as directed. If you are unsure how to take this medication, talk to your nurse or doctor. Original instructions: Take 1 tablet (5 mg total) by mouth daily at 4  PM. Until seen by warfarin clinic. What changed: See the new instructions.        Follow-up Information     [facility]. Schedule an appointment as soon as possible for a visit in 1 month(s).   Specialty: Neurology Why: stroke clinic Contact information: [address] [place] [phone]        Marda Stalker, [place]. Schedule an appointment as soon as possible for a visit in 1 week(s).   Specialty: Family Medicine Contact information: [place] [address] [phone]         [facility] [employer]. Go on 05/29/2021.   Specialty: Cardiology Why: INR check up. Contact information: [address], [place] [phone]        [facility]. Schedule an appointment as soon as possible for a visit in 2 week(s).          [facility] Follow up.   Why: The home health agency will contact you for the first home visit. Contact information: [phone]  Disposition: Home Diet recommendation: Cardiac diet  Discharge Exam: Vitals:   05/23/21 1600 05/23/21 1949 05/24/21 0800 05/24/21 1148  BP: 135/85 (!) 142/66 139/74 140/80  Pulse: 88 71 67 68  Resp: 18 16 16 16   Temp: 99.2 F (37.3 C) 100.1 F (37.8 C) 98.3 F (36.8 C) 98.2 F (36.8 C)  TempSrc: Oral Oral Oral Oral  SpO2: 98% 94% 95% 100%  Weight:      Height:       General: Appear in no distress; no visible Abnormal Neck Mass Or lumps, Conjunctiva normal Cardiovascular: S1 and S2 Present, aortic systolic Murmur, Respiratory: good respiratory effort, Bilateral Air entry present and CTA, no Crackles, no wheezes Abdomen: Bowel Sound present, Non tender Extremities: no Pedal edema Neurology: alert and oriented to time, place, and person Gait not checked due to patient safety concerns Filed Weights   05/21/21 1302  Weight: 81.6 kg   Condition at  discharge: stable  The results of significant diagnostics from this hospitalization (including imaging, microbiology, ancillary and laboratory) are listed below for reference.   Imaging Studies: CT ANGIO HEAD NECK W WO CM  Result Date: 05/21/2021 CLINICAL DATA:  Follow-up examination for acute stroke. EXAM: CT ANGIOGRAPHY HEAD AND NECK TECHNIQUE: Multidetector CT imaging of the head and neck was performed using the standard protocol during bolus administration of intravenous contrast. Multiplanar CT image reconstructions and MIPs were obtained to evaluate the vascular anatomy. Carotid stenosis measurements (when applicable) are obtained utilizing NASCET criteria, using the distal internal carotid diameter as the denominator. RADIATION DOSE REDUCTION: This exam was performed according to the departmental dose-optimization program which includes automated exposure control, adjustment of the mA and/or kV according to patient size and/or use of iterative reconstruction technique. CONTRAST:  17m OMNIPAQUE IOHEXOL 350 MG/ML SOLN COMPARISON:  Prior CT and MRI from earlier the same day. FINDINGS: CTA NECK FINDINGS Aortic arch: Partially visualized aortic arch normal in caliber. Origin of the great vessels not visualized on this exam. Right carotid system: Right common and internal carotid arteries tortuous but widely patent without stenosis or dissection. Left carotid system: Left common and internal carotid arteries tortuous but widely patent without stenosis or dissection. Vertebral arteries: Both vertebral arteries arise from subclavian arteries. No visible proximal subclavian artery stenosis vertebral arteries tortuous but widely patent without stenosis or dissection. Skeleton: Moderate spondylosis present at C4-5 through C6-7. Other neck: No other acute soft tissue abnormality within the neck. Upper chest: Visualized upper chest demonstrates no acute finding. Review of the MIP images confirms the above findings  CTA HEAD FINDINGS Anterior circulation: Petrous segments widely patent. Atheromatous change within the carotid siphons without hemodynamically significant stenosis. A1 segments patent bilaterally. Partially azygous ACA proximally. Both ACAs widely patent to their distal aspects. No M1 stenosis or occlusion. No proximal MCA branch occlusion. Distal MCA branches perfused and symmetric. Posterior circulation: Both V4 segments patent without stenosis. Both PICA patent. Basilar widely patent to its distal aspect. Both PCA supplied via the basilar as well as small bilateral posterior communicating arteries. Left PCA widely patent to its distal aspect. Moderate diffuse stenosis noted at the mid right P2 segment (series 12, image 46). Right PCA otherwise patent distally. Venous sinuses: Patent allowing for timing the contrast bolus. Anatomic variants: As above.  No aneurysm. Review of the MIP images confirms the above findings IMPRESSION: 1. Negative CTA for large vessel occlusion or other emergent finding. 2. Moderate diffuse stenosis involving the mid right P2 segment. 3. Additional mild for age  atheromatous change elsewhere about the major arterial vascular of the head and neck. No other proximal high-grade or correctable stenosis. 4. Diffuse arterial vascular tortuosity, suggesting chronic underlying hypertension. Electronically Signed   By: Jeannine Boga M.D.   On: 05/21/2021 20:21   DG Orthopantogram  Result Date: 05/23/2021 CLINICAL DATA:  Right tooth pain EXAM: ORTHOPANTOGRAM/PANORAMIC COMPARISON:  None Available. FINDINGS: Multiple fillings. Root canal in the upper medial incisor bilaterally. Normal mandible.  TMJ normal. Lucency around the right upper lateral incisor. Possible dental infection versus artifact. IMPRESSION: Possible lucency around right upper lateral incisor. Electronically Signed   By: Franchot Gallo M.D.   On: 05/23/2021 18:40   CT HEAD WO CONTRAST  Result Date: 05/21/2021 CLINICAL  DATA:  Neuro deficit, weakness EXAM: CT HEAD WITHOUT CONTRAST TECHNIQUE: Contiguous axial images were obtained from the base of the skull through the vertex without intravenous contrast. RADIATION DOSE REDUCTION: This exam was performed according to the departmental dose-optimization program which includes automated exposure control, adjustment of the mA and/or kV according to patient size and/or use of iterative reconstruction technique. COMPARISON:  CT head 10/24/2016 FINDINGS: Brain: No acute intracranial hemorrhage, mass effect, or hydrocephalus. Severe cortical volume loss, advanced since previous study. Patchy hypodensities in the periventricular and subcortical white matter, likely secondary to chronic microvascular ischemic changes. Encephalomalacia from an old infarct in the right MCA territory centered in the frontal lobe and basal ganglia regions. Vascular: No hyperdense vessel or unexpected calcification. Skull: Normal. Negative for fracture or focal lesion. Sinuses/Orbits: No acute finding. Other: Mild opacification in the right mastoid air cells. IMPRESSION: 1. No acute intracranial process identified. 2. Chronic changes including old right MCA infarct and advanced cortical volume loss. Electronically Signed   By: Ofilia Neas M.D.   On: 05/21/2021 13:24   [name]  Result Date: 05/21/2021 CLINICAL DATA:  Neuro deficit, acute, stroke suspected. Bilateral lower extremity weakness, left facial droop, and slurred speech. EXAM: MRI HEAD WITHOUT CONTRAST TECHNIQUE: Multiplanar, multiecho pulse sequences of the brain and surrounding structures were obtained without intravenous contrast. COMPARISON:  Head CT 05/21/2021 and MRI 10/25/2016 FINDINGS: Brain: There is a punctate acute to subacute cortical infarct in the high posterior left frontal lobe (series 5, image 96). A moderately large chronic right MCA infarct is again noted involving operculum, insula, and basal ganglia with  associated chronic blood products. A few scattered chronic microhemorrhages are noted elsewhere in the cerebrum bilaterally and in the left cerebellar hemisphere, slightly greater in number than which shown on the 2018 MRI although this may be partly technical. A chronic right cerebellar infarct is unchanged. Small T2 hyperintensities in the cerebral white matter bilaterally are stable to slightly increased compared to the prior MRI and are nonspecific but compatible with mild chronic small vessel ischemic disease. There is moderate cerebral atrophy. Chronically prominent extra-axial CSF spaces over the right greater than left frontal convexities are favored to predominantly reflect subarachnoid space enlargement from atrophy although coexistent small subdural hygromas are also possible, particularly on the right. There is no significant associated mass effect. Vascular: Major intracranial vascular flow voids are preserved. Skull and upper cervical spine: Unremarkable bone marrow signal. Sinuses/Orbits: Bilateral cataract extraction. Mild mucosal thickening in the ethmoid sinuses. Small to moderate right mastoid effusion. Other: None. IMPRESSION: 1. Punctate acute to subacute left frontal lobe infarct. 2. Chronic ischemia including a moderately large chronic right MCA infarct. Electronically Signed   By: Logan Bores M.D.   On: 05/21/2021 16:01  DG Chest Port 1 View  Result Date: 05/21/2021 CLINICAL DATA:  poss fever, weakness EXAM: PORTABLE CHEST 1 VIEW COMPARISON:  Chest radiograph dated August 02, 2020 FINDINGS: The cardiomediastinal silhouette is unchanged in contour.Status post median sternotomy and aortic valve replacement. Atrial appendage clip. Incomplete assessment of the LEFT costophrenic angle. No pleural effusion. No pneumothorax. No acute pleuroparenchymal abnormality. Visualized abdomen is unremarkable. IMPRESSION: No acute cardiopulmonary abnormality. Electronically Signed   By: Valentino Saxon  M.D.   On: 05/21/2021 17:21    Microbiology: Results for orders placed or performed during the hospital encounter of 05/21/21  Urine Culture     Status: Abnormal   Collection Time: 05/21/21  1:00 PM   Specimen: Urine, Clean Catch  Result Value Ref Range Status   Specimen Description URINE, CLEAN CATCH  Final   Special Requests   Final    NONE Performed at [facility], [place] [address], [place],  [address]    Culture >=100,000 COLONIES/mL KLEBSIELLA PNEUMONIAE (A)  Final   Report Status 05/24/2021 FINAL  Final   Organism ID, Bacteria KLEBSIELLA PNEUMONIAE (A)  Final      Susceptibility   Klebsiella pneumoniae - MIC*    AMPICILLIN >=32 RESISTANT Resistant     CEFAZOLIN <=4 SENSITIVE Sensitive     CEFEPIME <=0.12 SENSITIVE Sensitive     CEFTRIAXONE <=0.25 SENSITIVE Sensitive     CIPROFLOXACIN <=0.25 SENSITIVE Sensitive     GENTAMICIN <=1 SENSITIVE Sensitive     IMIPENEM <=0.25 SENSITIVE Sensitive     NITROFURANTOIN 128 RESISTANT Resistant     TRIMETH/SULFA <=20 SENSITIVE Sensitive     AMPICILLIN/SULBACTAM 8 SENSITIVE Sensitive     PIP/TAZO <=4 SENSITIVE Sensitive     * >=100,000 COLONIES/mL KLEBSIELLA PNEUMONIAE  Resp Panel by RT-PCR (Flu A&B, Covid) Nasopharyngeal Swab     Status: None   Collection Time: 05/21/21  1:45 PM   Specimen: Nasopharyngeal Swab; Nasopharyngeal(NP) swabs in vial transport medium  Result Value Ref Range Status   SARS Coronavirus 2 by RT PCR NEGATIVE NEGATIVE Final    Comment: (NOTE) SARS-CoV-2 target nucleic acids are NOT DETECTED.  The SARS-CoV-2 RNA is generally detectable in upper respiratory specimens during the acute phase of infection. The lowest concentration of SARS-CoV-2 viral copies this assay can detect is 138 copies/mL. A negative result does not preclude SARS-Cov-2 infection and should not be used as the sole basis for treatment or other patient management decisions. A negative result may occur with  improper specimen  collection/handling, submission of specimen other than nasopharyngeal swab, presence of viral mutation(s) within the areas targeted by this assay, and inadequate number of viral copies(<138 copies/mL). A negative result must be combined with clinical observations, patient history, and epidemiological information. The expected result is Negative.  Fact Sheet for Patients:  EntrepreneurPulse.com.au  Fact Sheet for Healthcare Providers:  IncredibleEmployment.be  This test is no t yet approved or cleared by the [place] FDA and  has been authorized for detection and/or diagnosis of SARS-CoV-2 by FDA under an Emergency Use Authorization (EUA). This EUA will remain  in effect (meaning this test can be used) for the duration of the COVID-19 declaration under Section 564(b)(1) of the Act, 21 U.S.C.section 360bbb-3(b)(1), unless the authorization is terminated  or revoked sooner.       Influenza A by PCR NEGATIVE NEGATIVE Final   Influenza B by PCR NEGATIVE NEGATIVE Final    Comment: (NOTE) The Xpert Xpress SARS-CoV-2/FLU/RSV plus assay is intended as an aid  in the diagnosis of influenza from Nasopharyngeal swab specimens and should not be used as a sole basis for treatment. Nasal washings and aspirates are unacceptable for Xpert Xpress SARS-CoV-2/FLU/RSV testing.  Fact Sheet for Patients: EntrepreneurPulse.com.au  Fact Sheet for Healthcare Providers: IncredibleEmployment.be  This test is not yet approved or cleared by the [place] FDA and has been authorized for detection and/or diagnosis of SARS-CoV-2 by FDA under an Emergency Use Authorization (EUA). This EUA will remain in effect (meaning this test can be used) for the duration of the COVID-19 declaration under Section 564(b)(1) of the Act, 21 U.S.C. section 360bbb-3(b)(1), unless the authorization is terminated or revoked.  Performed at [facility], [place] [address] [place], [place] [address]   Blood culture (routine x 2)     Status: None   Collection Time: 05/21/21  3:45 PM   Specimen: BLOOD  Result Value Ref Range Status   Specimen Description BLOOD SITE NOT SPECIFIED  Final   Special Requests   Final    BOTTLES DRAWN AEROBIC AND ANAEROBIC Blood Culture adequate volume   Culture   Final    NO GROWTH 5 DAYS Performed at [facility], [address] [place], [place] [address]    Report Status 05/26/2021 FINAL  Final  Blood culture (routine x 2)     Status: Abnormal   Collection Time: 05/21/21  3:50 PM   Specimen: BLOOD  Result Value Ref Range Status   Specimen Description BLOOD SITE NOT SPECIFIED  Final   Special Requests   Final    BOTTLES DRAWN AEROBIC AND ANAEROBIC Blood Culture adequate volume   Culture  Setup Time   Final    GRAM POSITIVE COCCI IN CLUSTERS IN BOTH AEROBIC AND ANAEROBIC BOTTLES CRITICAL RESULT CALLED TO, READ BACK BY AND VERIFIED WITH: PHARMD [name] [id number] FCP    Culture (A)  Final    STAPHYLOCOCCUS HOMINIS THE SIGNIFICANCE OF ISOLATING THIS ORGANISM FROM A SINGLE SET OF BLOOD CULTURES WHEN MULTIPLE SETS ARE DRAWN IS UNCERTAIN. PLEASE NOTIFY THE MICROBIOLOGY DEPARTMENT WITHIN ONE WEEK IF SPECIATION AND SENSITIVITIES ARE REQUIRED. Performed at [facility], [place] [address], [place], [place] [address]    Report Status 05/24/2021 FINAL  Final  Blood Culture ID Panel (Reflexed)     Status: Abnormal   Collection Time: 05/21/21  3:50 PM  Result Value Ref Range Status   Enterococcus faecalis NOT DETECTED NOT DETECTED Final   Enterococcus Faecium NOT DETECTED NOT DETECTED Final   Listeria monocytogenes NOT DETECTED NOT DETECTED Final   Staphylococcus species DETECTED (A) NOT DETECTED Final    Comment: CRITICAL RESULT CALLED TO, READ BACK BY AND VERIFIED WITH: PHARMD [name] L [id number] FCP    Staphylococcus aureus (BCID) NOT DETECTED NOT DETECTED Final   Staphylococcus  epidermidis NOT DETECTED NOT DETECTED Final   Staphylococcus lugdunensis NOT DETECTED NOT DETECTED Final   Streptococcus species NOT DETECTED NOT DETECTED Final   Streptococcus agalactiae NOT DETECTED NOT DETECTED Final   Streptococcus pneumoniae NOT DETECTED NOT DETECTED Final   Streptococcus pyogenes NOT DETECTED NOT DETECTED Final   A.calcoaceticus-baumannii NOT DETECTED NOT DETECTED Final   Bacteroides fragilis NOT DETECTED NOT DETECTED Final   Enterobacterales NOT DETECTED NOT DETECTED Final   Enterobacter cloacae complex NOT DETECTED NOT DETECTED Final   Escherichia coli NOT DETECTED NOT DETECTED Final   Klebsiella aerogenes NOT DETECTED NOT DETECTED Final   Klebsiella oxytoca NOT DETECTED NOT DETECTED Final   Klebsiella  pneumoniae NOT DETECTED NOT DETECTED Final   Proteus species NOT DETECTED NOT DETECTED Final   Salmonella species NOT DETECTED NOT DETECTED Final   Serratia marcescens NOT DETECTED NOT DETECTED Final   Haemophilus influenzae NOT DETECTED NOT DETECTED Final   Neisseria meningitidis NOT DETECTED NOT DETECTED Final   Pseudomonas aeruginosa NOT DETECTED NOT DETECTED Final   Stenotrophomonas maltophilia NOT DETECTED NOT DETECTED Final   Candida albicans NOT DETECTED NOT DETECTED Final   Candida auris NOT DETECTED NOT DETECTED Final   Candida glabrata NOT DETECTED NOT DETECTED Final   Candida krusei NOT DETECTED NOT DETECTED Final   Candida parapsilosis NOT DETECTED NOT DETECTED Final   Candida tropicalis NOT DETECTED NOT DETECTED Final   Cryptococcus neoformans/gattii NOT DETECTED NOT DETECTED Final    Comment: Performed at [facility], [place] [address] [place], [place] [address]    Labs: CBC: Recent Labs  Lab 05/21/21 1300 05/21/21 1356 05/23/21 0204 05/24/21 0603  WBC 12.7*  --  12.0* 8.1  NEUTROABS 10.5*  --   --   --   HGB 12.3* 12.9* 11.7* 11.0*  HCT 37.7* 38.0* 33.6* 32.6*  MCV 99.2  --  95.2 95.9  PLT 126*  --  113* 427*   Basic Metabolic  Panel: Recent Labs  Lab 05/21/21 1300 05/21/21 1356 05/23/21 0204 05/24/21 0603  NA 138 139 135 139  K 3.7 3.7 3.5 3.6  CL 108 104 106 108  CO2 22  --  23 24  GLUCOSE 145* 150* 125* 113*  BUN 13 13 11 17   CREATININE 0.94 0.90 0.93 1.19  CALCIUM 9.0  --  8.7* 8.6*  MG  --   --   --  2.2   Liver Function Tests: Recent Labs  Lab 05/21/21 1300 05/24/21 0603  AST 23 21  ALT 15 18  ALKPHOS 52 44  BILITOT 1.6* 0.8  PROT 7.2 6.2*  ALBUMIN 3.5 2.9*   CBG: No results for input(s): GLUCAP in the last 168 hours.  Discharge time spent: greater than 30 minutes.  Signed: Berle Mull, MD [facility] 05/24/2021

## 2021-05-26 NOTE — Discharge Summary (Incomplete)
Physician Discharge Summary   Patient: [name]. MRN: [id number] DOB: September 29, 1935  Admit date:     05/21/2021  Discharge date: 05/24/2021  Discharge Physician: Berle Mull  PCP: Marda Stalker, PA-C  Recommendations at discharge:  Follow up with PCP in 1 week  Discharge Diagnoses: Principal Problem:   Acute CVA (cerebrovascular accident) ([place]) Active Problems:   SIRS (systemic inflammatory response syndrome) (HCC)   Generalized weakness   Essential hypertension   Otitis externa   Permanent atrial fibrillation (HCC)   CAD (coronary artery disease)   S/P AVR   s/p mitral valve repair   Anemia due to chronic blood loss   Benign prostatic hyperplasia with lower urinary tract symptoms   Thrombocytopenia [facility])  Hospital Course: PMH of BPH, non obstructive CAD by LHC in 2019, atrial fibrillation s/p MAZE, s/p AVR 7/20212 secondary to aortic insufficiency, hx of CVA, HTN, hx of MVR with complaints of generalized weakness. -Patient was admitted with concerns for acute CVA and brain MRI does demonstrate acute/subacute left frontal lobe infarct.  Assessment and Plan: * Acute CVA (cerebrovascular accident) ([place]) 86 year old with history of weakness and slurred speech with history of previous CVA, HTN, afib on coumadin found to have new very small acute left frontal lobe infarct.  -MRI with left frontal lobe subacute/acute CVA noted -Neurochecks per protocol -Neurology consulted, but did not feel like they would need to officially see patient unless CTA head/neck show new abnormal finding as stroke is small and left facial deficits from previous CVA  -CTA head/neck with no LVO -echo recently done in 03/2021  -a1c pending and LDL 77. On no statin, will start this. -crestor 52m/day  -on coumadin-subtherapeutic  -Permissive hypertension first 24 hours <220/110 -SLP evaluation completed and patient on heart healthy diet -OT evaluation this a.m. with recommendation for SNF on  discharge  SIRS (systemic inflammatory response syndrome) ([place] During hospital work up for stroke found to have fever to 101 and met SIRS criteria with WBC and HR 92.  -CXR with no acute finding -covid/flu negative  -blood cultures obtained -right ear with otitis externa, treating with cipro dex  -urine is positive for nitrites. Will add on culture and give 1 dose of fosfomycin  -also has complaints of tooth pain and concern for abscess. On exam do not see obvious abscess. Will start augmentin  -he is not altered and INR 1.8, not best candidate for LP -no abdominal complaints.  -trend fever curve and cbc   Generalized weakness Could be multifactorial -new CVA, albeit very small  -SIRS/sepsis -also complains of dizziness/lightheadedness with standing, will check orthostatics, hold BP meds and flomax and will continue gentle IVF  -PT/OT to eval   Otitis externa Right ear. Continue ciprodex TM pearly with light reflex   Essential hypertension Allow for permissive HTN in setting of acute CVA Home medication on hold: lisinopril 160m atenolol 12.31m2mnd norvasc 52m231mn hydralazine as needed   CAD (coronary artery disease) Non obstructive CAD by LHC 2019  Hold atenolol in setting of acute CVA   Permanent atrial fibrillation (HCC) S/p MAZE in 2020 Rate controlled, continue coumadin per pharmacy dosing  INR 1.7 today, recommend not taking glucosamine, can interfere with coumadin   S/P AVR S/p bioprosthetic AVR in 2012.   -Normally functioning bioprosthetic AVR by echocardiogram in March 2023.   -Continue SBE prophylaxis.  s/p mitral valve repair Normally functioning mitral valve repair and tricuspid valve repair by echocardiogram March 2023  Anemia due  to chronic blood loss Stable at baseline Followed by hematology with low clinical suspicion for malignancy Continue iron  Benign prostatic hyperplasia with lower urinary tract symptoms Continue proscar, hold flomax until  orthostatics result   Thrombocytopenia ([place]) At baseline, continue to monitor       {Tip this will not be part of the note when signed Body mass index is 24.41 kg/m. , ,  (Optional):26781}  {(NOTE) Pain control PDMP Statment (Optional):26782}  Consultants: *** Procedures performed:  *** DISCHARGE MEDICATION: Allergies as of 05/24/2021       Reactions   Sulfa Antibiotics Other (See Comments)   Granulocytosis   Lisinopril    Other reaction(s): cough        Medication List     STOP taking these medications    lisinopril 10 MG tablet Commonly known as: ZESTRIL       TAKE these medications    allopurinol 300 MG tablet Commonly known as: ZYLOPRIM Take 150 mg by mouth every morning.   amLODipine 10 MG tablet Commonly known as: NORVASC TAKE 1 TABLET BY MOUTH  DAILY   amoxicillin 500 MG capsule Commonly known as: AMOXIL Take 2,000 mg by mouth See admin instructions. Take 4 capsules (2000 mg) by mouth 1 hour prior to dental appointment   atenolol 25 MG tablet Commonly known as: TENORMIN Take 0.5 tablets (12.5 mg total) by mouth daily. What changed: See the new instructions.   Centrum Silver Adult 50+ Tabs Take by mouth.   Cinnamon 500 MG capsule Take 500 mg by mouth every morning.   ciprofloxacin-dexamethasone OTIC suspension Commonly known as: CIPRODEX Place 4 drops into the right ear 2 (two) times daily.   feeding supplement Liqd Take 237 mLs by mouth 2 (two) times daily between meals.   ferrous sulfate 325 (65 FE) MG EC tablet Take 325 mg by mouth 2 (two) times daily.   finasteride 5 MG tablet Commonly known as: PROSCAR Take 1 tablet (5 mg total) by mouth daily.   gabapentin 100 MG capsule Commonly known as: NEURONTIN Take 200 mg by mouth at bedtime.   GLUCOSAMINE CHONDROIT-COLLAGEN PO Take 1 tablet by mouth 2 (two) times daily.   levofloxacin 500 MG tablet Commonly known as: Levaquin Take 1 tablet (500 mg total) by mouth daily for 6  days.   metroNIDAZOLE 500 MG tablet Commonly known as: Flagyl Take 1 tablet (500 mg total) by mouth 2 (two) times daily for 7 days.   rosuvastatin 10 MG tablet Commonly known as: CRESTOR Take 1 tablet (10 mg total) by mouth daily.   tamsulosin 0.4 MG Caps capsule Commonly known as: FLOMAX Take 2 capsules (0.8 mg total) by mouth daily after breakfast.   traZODone 50 MG tablet Commonly known as: DESYREL Take 50 mg by mouth at bedtime as needed.   warfarin 5 MG tablet Commonly known as: COUMADIN Take as directed. If you are unsure how to take this medication, talk to your nurse or doctor. Original instructions: Take 1 tablet (5 mg total) by mouth daily at 4 PM. Until seen by warfarin clinic. What changed: See the new instructions.        Follow-up Information     [facility]. Schedule an appointment as soon as possible for a visit in 1 month(s).   Specialty: Neurology Why: stroke clinic Contact information: [address] [place] [phone]        Marda Stalker, [place]. Schedule an appointment as soon as possible for  a visit in 1 week(s).   Specialty: Family Medicine Contact information: [place] [address] [phone]         [facility] [employer]. Go on 05/29/2021.   Specialty: Cardiology Why: INR check up. Contact information: [address] [place] [phone]        [facility]. Schedule an appointment as soon as possible for a visit in 2 week(s).          [facility] Follow up.   Why: The home health agency will contact you for the first home visit. Contact information: [phone]               Disposition: {comingfrom:22515} Diet recommendation: {dietplan:22518}  Discharge Exam: Vitals:   05/23/21 1600 05/23/21 1949 05/24/21 0800 05/24/21 1148  BP: 135/85 (!) 142/66 139/74 140/80  Pulse:  88 71 67 68  Resp: _0 Temp: 99.2 F (37.3 C) 100.1 F (37.8 C) 98.3 F (36.8 C) 98.2 F (36.8 C)  TempSrc: Oral Oral Oral Oral  SpO2: 98% 94% 95% 100%  Weight:      Height:       General: Appear in {DEGREE - MILD, MOD, SEV:22033} distress; no visible Abnormal Neck Mass Or lumps, Conjunctiva normal Cardiovascular: S1 and S2 Present, *** Murmur, Respiratory: {Desc; increased/descreased:10091} respiratory effort, Bilateral Air entry present and ***CTA, *** Crackles, *** wheezes Abdomen: Bowel Sound present, *** Extremities: *** Pedal edema Neurology: {EXAM; NEURO ALERTNESS:23097} and {orientationexam:27336} Gait not checked due to patient safety concerns Filed Weights   05/21/21 1302  Weight: 81.6 kg   Condition at discharge: stable  The results of significant diagnostics from this hospitalization (including imaging, microbiology, ancillary and laboratory) are listed below for reference.   Imaging Studies: CT ANGIO HEAD NECK W WO CM  Result Date: 05/21/2021 CLINICAL DATA:  Follow-up examination for acute stroke. EXAM: CT ANGIOGRAPHY HEAD AND NECK TECHNIQUE: Multidetector CT imaging of the head and neck was performed using the standard protocol during bolus administration of intravenous contrast. Multiplanar CT image reconstructions and MIPs were obtained to evaluate the vascular anatomy. Carotid stenosis measurements (when applicable) are obtained utilizing NASCET criteria, using the distal internal carotid diameter as the denominator. RADIATION DOSE REDUCTION: This exam was performed according to the departmental dose-optimization program which includes automated exposure control, adjustment of the mA and/or kV according to patient size and/or use of iterative reconstruction technique. CONTRAST:  56m OMNIPAQUE IOHEXOL 350 MG/ML SOLN COMPARISON:  Prior CT and MRI from earlier the same day. FINDINGS: CTA NECK FINDINGS Aortic arch: Partially visualized aortic arch normal in  caliber. Origin of the great vessels not visualized on this exam. Right carotid system: Right common and internal carotid arteries tortuous but widely patent without stenosis or dissection. Left carotid system: Left common and internal carotid arteries tortuous but widely patent without stenosis or dissection. Vertebral arteries: Both vertebral arteries arise from subclavian arteries. No visible proximal subclavian artery stenosis vertebral arteries tortuous but widely patent without stenosis or dissection. Skeleton: Moderate spondylosis present at C4-5 through C6-7. Other neck: No other acute soft tissue abnormality within the neck. Upper chest: Visualized upper chest demonstrates no acute finding. Review of the MIP images confirms the above findings CTA HEAD FINDINGS Anterior circulation: Petrous segments widely patent. Atheromatous change within the carotid siphons without hemodynamically significant stenosis. A1 segments patent bilaterally. Partially azygous ACA proximally. Both ACAs widely patent to their distal aspects. No M1 stenosis or  occlusion. No proximal MCA branch occlusion. Distal MCA branches perfused and symmetric. Posterior circulation: Both V4 segments patent without stenosis. Both PICA patent. Basilar widely patent to its distal aspect. Both PCA supplied via the basilar as well as small bilateral posterior communicating arteries. Left PCA widely patent to its distal aspect. Moderate diffuse stenosis noted at the mid right P2 segment (series 12, image 46). Right PCA otherwise patent distally. Venous sinuses: Patent allowing for timing the contrast bolus. Anatomic variants: As above.  No aneurysm. Review of the MIP images confirms the above findings IMPRESSION: 1. Negative CTA for large vessel occlusion or other emergent finding. 2. Moderate diffuse stenosis involving the mid right P2 segment. 3. Additional mild for age atheromatous change elsewhere about the major arterial vascular of the head and  neck. No other proximal high-grade or correctable stenosis. 4. Diffuse arterial vascular tortuosity, suggesting chronic underlying hypertension. Electronically Signed   By: Jeannine Boga M.D.   On: 05/21/2021 20:21   DG Orthopantogram  Result Date: 05/23/2021 CLINICAL DATA:  Right tooth pain EXAM: ORTHOPANTOGRAM/PANORAMIC COMPARISON:  None Available. FINDINGS: Multiple fillings. Root canal in the upper medial incisor bilaterally. Normal mandible.  TMJ normal. Lucency around the right upper lateral incisor. Possible dental infection versus artifact. IMPRESSION: Possible lucency around right upper lateral incisor. Electronically Signed   By: Franchot Gallo M.D.   On: 05/23/2021 18:40   CT HEAD WO CONTRAST  Result Date: 05/21/2021 CLINICAL DATA:  Neuro deficit, weakness EXAM: CT HEAD WITHOUT CONTRAST TECHNIQUE: Contiguous axial images were obtained from the base of the skull through the vertex without intravenous contrast. RADIATION DOSE REDUCTION: This exam was performed according to the departmental dose-optimization program which includes automated exposure control, adjustment of the mA and/or kV according to patient size and/or use of iterative reconstruction technique. COMPARISON:  CT head 10/24/2016 FINDINGS: Brain: No acute intracranial hemorrhage, mass effect, or hydrocephalus. Severe cortical volume loss, advanced since previous study. Patchy hypodensities in the periventricular and subcortical white matter, likely secondary to chronic microvascular ischemic changes. Encephalomalacia from an old infarct in the right MCA territory centered in the frontal lobe and basal ganglia regions. Vascular: No hyperdense vessel or unexpected calcification. Skull: Normal. Negative for fracture or focal lesion. Sinuses/Orbits: No acute finding. Other: Mild opacification in the right mastoid air cells. IMPRESSION: 1. No acute intracranial process identified. 2. Chronic changes including old right MCA infarct  and advanced cortical volume loss. Electronically Signed   By: Ofilia Neas M.D.   On: 05/21/2021 13:24   [name]  Result Date: 05/21/2021 CLINICAL DATA:  Neuro deficit, acute, stroke suspected. Bilateral lower extremity weakness, left facial droop, and slurred speech. EXAM: MRI HEAD WITHOUT CONTRAST TECHNIQUE: Multiplanar, multiecho pulse sequences of the brain and surrounding structures were obtained without intravenous contrast. COMPARISON:  Head CT 05/21/2021 and MRI 10/25/2016 FINDINGS: Brain: There is a punctate acute to subacute cortical infarct in the high posterior left frontal lobe (series 5, image 96). A moderately large chronic right MCA infarct is again noted involving operculum, insula, and basal ganglia with associated chronic blood products. A few scattered chronic microhemorrhages are noted elsewhere in the cerebrum bilaterally and in the left cerebellar hemisphere, slightly greater in number than which shown on the 2018 MRI although this may be partly technical. A chronic right cerebellar infarct is unchanged. Small T2 hyperintensities in the cerebral white matter bilaterally are stable to slightly increased compared to the prior MRI and are nonspecific but compatible with mild chronic  small vessel ischemic disease. There is moderate cerebral atrophy. Chronically prominent extra-axial CSF spaces over the right greater than left frontal convexities are favored to predominantly reflect subarachnoid space enlargement from atrophy although coexistent small subdural hygromas are also possible, particularly on the right. There is no significant associated mass effect. Vascular: Major intracranial vascular flow voids are preserved. Skull and upper cervical spine: Unremarkable bone marrow signal. Sinuses/Orbits: Bilateral cataract extraction. Mild mucosal thickening in the ethmoid sinuses. Small to moderate right mastoid effusion. Other: None. IMPRESSION: 1. Punctate acute to subacute  left frontal lobe infarct. 2. Chronic ischemia including a moderately large chronic right MCA infarct. Electronically Signed   By: Logan Bores M.D.   On: 05/21/2021 16:01   DG Chest Port 1 View  Result Date: 05/21/2021 CLINICAL DATA:  poss fever, weakness EXAM: PORTABLE CHEST 1 VIEW COMPARISON:  Chest radiograph dated August 02, 2020 FINDINGS: The cardiomediastinal silhouette is unchanged in contour.Status post median sternotomy and aortic valve replacement. Atrial appendage clip. Incomplete assessment of the LEFT costophrenic angle. No pleural effusion. No pneumothorax. No acute pleuroparenchymal abnormality. Visualized abdomen is unremarkable. IMPRESSION: No acute cardiopulmonary abnormality. Electronically Signed   By: Valentino Saxon M.D.   On: 05/21/2021 17:21    Microbiology: Results for orders placed or performed during the hospital encounter of 05/21/21  Urine Culture     Status: Abnormal   Collection Time: 05/21/21  1:00 PM   Specimen: Urine, Clean Catch  Result Value Ref Range Status   Specimen Description URINE, CLEAN CATCH  Final   Special Requests   Final    NONE Performed at [facility], [place] [address], [place], [place] [address]    Culture >=100,000 COLONIES/mL KLEBSIELLA PNEUMONIAE (A)  Final   Report Status 05/24/2021 FINAL  Final   Organism ID, Bacteria KLEBSIELLA PNEUMONIAE (A)  Final      Susceptibility   Klebsiella pneumoniae - MIC*    AMPICILLIN >=32 RESISTANT Resistant     CEFAZOLIN <=4 SENSITIVE Sensitive     CEFEPIME <=0.12 SENSITIVE Sensitive     CEFTRIAXONE <=0.25 SENSITIVE Sensitive     CIPROFLOXACIN <=0.25 SENSITIVE Sensitive     GENTAMICIN <=1 SENSITIVE Sensitive     IMIPENEM <=0.25 SENSITIVE Sensitive     NITROFURANTOIN 128 RESISTANT Resistant     TRIMETH/SULFA <=20 SENSITIVE Sensitive     AMPICILLIN/SULBACTAM 8 SENSITIVE Sensitive     PIP/TAZO <=4 SENSITIVE Sensitive     * >=100,000 COLONIES/mL KLEBSIELLA PNEUMONIAE  Resp Panel by RT-PCR (Flu  A&B, Covid) Nasopharyngeal Swab     Status: None   Collection Time: 05/21/21  1:45 PM   Specimen: Nasopharyngeal Swab; Nasopharyngeal(NP) swabs in vial transport medium  Result Value Ref Range Status   SARS Coronavirus 2 by RT PCR NEGATIVE NEGATIVE Final    Comment: (NOTE) SARS-CoV-2 target nucleic acids are NOT DETECTED.  The SARS-CoV-2 RNA is generally detectable in upper respiratory specimens during the acute phase of infection. The lowest concentration of SARS-CoV-2 viral copies this assay can detect is 138 copies/mL. A negative result does not preclude SARS-Cov-2 infection and should not be used as the sole basis for treatment or other patient management decisions. A negative result may occur with  improper specimen collection/handling, submission of specimen other than nasopharyngeal swab, presence of viral mutation(s) within the areas targeted by this assay, and inadequate number of viral copies(<138 copies/mL). A negative result must be combined with clinical observations, patient history, and epidemiological information. The expected result is Negative.  Fact Sheet for Patients:  EntrepreneurPulse.com.au  Fact Sheet for Healthcare Providers:  IncredibleEmployment.be  This test is no t yet approved or cleared by the [place] FDA and  has been authorized for detection and/or diagnosis of SARS-CoV-2 by FDA under an Emergency Use Authorization (EUA). This EUA will remain  in effect (meaning this test can be used) for the duration of the COVID-19 declaration under Section 564(b)(1) of the Act, 21 U.S.C.section 360bbb-3(b)(1), unless the authorization is terminated  or revoked sooner.       Influenza A by PCR NEGATIVE NEGATIVE Final   Influenza B by PCR NEGATIVE NEGATIVE Final    Comment: (NOTE) The Xpert Xpress SARS-CoV-2/FLU/RSV plus assay is intended as an aid in the diagnosis of influenza from Nasopharyngeal swab specimens  and should not be used as a sole basis for treatment. Nasal washings and aspirates are unacceptable for Xpert Xpress SARS-CoV-2/FLU/RSV testing.  Fact Sheet for Patients: EntrepreneurPulse.com.au  Fact Sheet for Healthcare Providers: IncredibleEmployment.be  This test is not yet approved or cleared by the [place] FDA and has been authorized for detection and/or diagnosis of SARS-CoV-2 by FDA under an Emergency Use Authorization (EUA). This EUA will remain in effect (meaning this test can be used) for the duration of the COVID-19 declaration under Section 564(b)(1) of the Act, 21 U.S.C. section 360bbb-3(b)(1), unless the authorization is terminated or revoked.  Performed at [facility], [place] [address], [place], [place] [address]   Blood culture (routine x 2)     Status: None   Collection Time: 05/21/21  3:45 PM   Specimen: BLOOD  Result Value Ref Range Status   Specimen Description BLOOD SITE NOT SPECIFIED  Final   Special Requests   Final    BOTTLES DRAWN AEROBIC AND ANAEROBIC Blood Culture adequate volume   Culture   Final    NO GROWTH 5 DAYS Performed at [facility], [address], [place], [place] [address]    Report Status 05/26/2021 FINAL  Final  Blood culture (routine x 2)     Status: Abnormal   Collection Time: 05/21/21  3:50 PM   Specimen: BLOOD  Result Value Ref Range Status   Specimen Description BLOOD SITE NOT SPECIFIED  Final   Special Requests   Final    BOTTLES DRAWN AEROBIC AND ANAEROBIC Blood Culture adequate volume   Culture  Setup Time   Final    GRAM POSITIVE COCCI IN CLUSTERS IN BOTH AEROBIC AND ANAEROBIC BOTTLES CRITICAL RESULT CALLED TO, READ BACK BY AND VERIFIED WITH: PHARMD [name] [phone] [id number] FCP    Culture (A)  Final    STAPHYLOCOCCUS HOMINIS THE SIGNIFICANCE OF ISOLATING THIS ORGANISM FROM A SINGLE SET OF BLOOD CULTURES WHEN MULTIPLE SETS ARE DRAWN IS UNCERTAIN. PLEASE NOTIFY THE  MICROBIOLOGY DEPARTMENT WITHIN ONE WEEK IF SPECIATION AND SENSITIVITIES ARE REQUIRED. Performed at [facility], [place] [address] [place], [place] [address]    Report Status 05/24/2021 FINAL  Final  Blood Culture ID Panel (Reflexed)     Status: Abnormal   Collection Time: 05/21/21  3:50 PM  Result Value Ref Range Status   Enterococcus faecalis NOT DETECTED NOT DETECTED Final   Enterococcus Faecium NOT DETECTED NOT DETECTED Final   Listeria monocytogenes NOT DETECTED NOT DETECTED Final   Staphylococcus species DETECTED (A) NOT DETECTED Final    Comment: CRITICAL RESULT CALLED TO, READ BACK BY AND VERIFIED WITH: PHARMD [name] L [id number] FCP    Staphylococcus aureus (BCID)  NOT DETECTED NOT DETECTED Final   Staphylococcus epidermidis NOT DETECTED NOT DETECTED Final   Staphylococcus lugdunensis NOT DETECTED NOT DETECTED Final   Streptococcus species NOT DETECTED NOT DETECTED Final   Streptococcus agalactiae NOT DETECTED NOT DETECTED Final   Streptococcus pneumoniae NOT DETECTED NOT DETECTED Final   Streptococcus pyogenes NOT DETECTED NOT DETECTED Final   A.calcoaceticus-baumannii NOT DETECTED NOT DETECTED Final   Bacteroides fragilis NOT DETECTED NOT DETECTED Final   Enterobacterales NOT DETECTED NOT DETECTED Final   Enterobacter cloacae complex NOT DETECTED NOT DETECTED Final   Escherichia coli NOT DETECTED NOT DETECTED Final   Klebsiella aerogenes NOT DETECTED NOT DETECTED Final   Klebsiella oxytoca NOT DETECTED NOT DETECTED Final   Klebsiella pneumoniae NOT DETECTED NOT DETECTED Final   Proteus species NOT DETECTED NOT DETECTED Final   Salmonella species NOT DETECTED NOT DETECTED Final   Serratia marcescens NOT DETECTED NOT DETECTED Final   Haemophilus influenzae NOT DETECTED NOT DETECTED Final   Neisseria meningitidis NOT DETECTED NOT DETECTED Final   Pseudomonas aeruginosa NOT DETECTED NOT DETECTED Final   Stenotrophomonas maltophilia NOT DETECTED NOT DETECTED Final    Candida albicans NOT DETECTED NOT DETECTED Final   Candida auris NOT DETECTED NOT DETECTED Final   Candida glabrata NOT DETECTED NOT DETECTED Final   Candida krusei NOT DETECTED NOT DETECTED Final   Candida parapsilosis NOT DETECTED NOT DETECTED Final   Candida tropicalis NOT DETECTED NOT DETECTED Final   Cryptococcus neoformans/gattii NOT DETECTED NOT DETECTED Final    Comment: Performed at [facility], [address], [place], [place] [address]    Labs: CBC: Recent Labs  Lab 05/21/21 1300 05/21/21 1356 05/23/21 0204 05/24/21 0603  WBC 12.7*  --  12.0* 8.1  NEUTROABS 10.5*  --   --   --   HGB 12.3* 12.9* 11.7* 11.0*  HCT 37.7* 38.0* 33.6* 32.6*  MCV 99.2  --  95.2 95.9  PLT 126*  --  113* 737*   Basic Metabolic Panel: Recent Labs  Lab 05/21/21 1300 05/21/21 1356 05/23/21 0204 05/24/21 0603  NA 138 139 135 139  K 3.7 3.7 3.5 3.6  CL 108 104 106 108  CO2 22  --  23 24  GLUCOSE 145* 150* 125* 113*  BUN _0 CREATININE 0.94 0.90 0.93 1.19  CALCIUM 9.0  --  8.7* 8.6*  MG  --   --   --  2.2   Liver Function Tests: Recent Labs  Lab 05/21/21 1300 05/24/21 0603  AST 23 21  ALT 15 18  ALKPHOS 52 44  BILITOT 1.6* 0.8  PROT 7.2 6.2*  ALBUMIN 3.5 2.9*   CBG: No results for input(s): GLUCAP in the last 168 hours.  Discharge time spent: {LESS THAN/GREATER TGGY:69485} 30 minutes.  Signed: Berle Mull, MD [facility] 05/24/2021

## 2021-05-27 DIAGNOSIS — Z9181 History of falling: Secondary | ICD-10-CM | POA: Diagnosis not present

## 2021-05-27 DIAGNOSIS — R531 Weakness: Secondary | ICD-10-CM | POA: Diagnosis not present

## 2021-05-27 DIAGNOSIS — M109 Gout, unspecified: Secondary | ICD-10-CM | POA: Diagnosis not present

## 2021-05-27 DIAGNOSIS — I081 Rheumatic disorders of both mitral and tricuspid valves: Secondary | ICD-10-CM | POA: Diagnosis not present

## 2021-05-27 DIAGNOSIS — H6091 Unspecified otitis externa, right ear: Secondary | ICD-10-CM | POA: Diagnosis not present

## 2021-05-27 DIAGNOSIS — K047 Periapical abscess without sinus: Secondary | ICD-10-CM | POA: Diagnosis not present

## 2021-05-27 DIAGNOSIS — I4821 Permanent atrial fibrillation: Secondary | ICD-10-CM | POA: Diagnosis not present

## 2021-05-27 DIAGNOSIS — I77819 Aortic ectasia, unspecified site: Secondary | ICD-10-CM | POA: Diagnosis not present

## 2021-05-27 DIAGNOSIS — D696 Thrombocytopenia, unspecified: Secondary | ICD-10-CM | POA: Diagnosis not present

## 2021-05-27 DIAGNOSIS — D500 Iron deficiency anemia secondary to blood loss (chronic): Secondary | ICD-10-CM | POA: Diagnosis not present

## 2021-05-27 DIAGNOSIS — R7303 Prediabetes: Secondary | ICD-10-CM | POA: Diagnosis not present

## 2021-05-27 DIAGNOSIS — I110 Hypertensive heart disease with heart failure: Secondary | ICD-10-CM | POA: Diagnosis not present

## 2021-05-27 DIAGNOSIS — Z7901 Long term (current) use of anticoagulants: Secondary | ICD-10-CM | POA: Diagnosis not present

## 2021-05-27 DIAGNOSIS — G629 Polyneuropathy, unspecified: Secondary | ICD-10-CM | POA: Diagnosis not present

## 2021-05-27 DIAGNOSIS — I509 Heart failure, unspecified: Secondary | ICD-10-CM | POA: Diagnosis not present

## 2021-05-27 DIAGNOSIS — I2510 Atherosclerotic heart disease of native coronary artery without angina pectoris: Secondary | ICD-10-CM | POA: Diagnosis not present

## 2021-05-27 DIAGNOSIS — K5790 Diverticulosis of intestine, part unspecified, without perforation or abscess without bleeding: Secondary | ICD-10-CM | POA: Diagnosis not present

## 2021-05-27 DIAGNOSIS — I69398 Other sequelae of cerebral infarction: Secondary | ICD-10-CM | POA: Diagnosis not present

## 2021-05-27 DIAGNOSIS — I69392 Facial weakness following cerebral infarction: Secondary | ICD-10-CM | POA: Diagnosis not present

## 2021-05-27 DIAGNOSIS — I69328 Other speech and language deficits following cerebral infarction: Secondary | ICD-10-CM | POA: Diagnosis not present

## 2021-05-27 DIAGNOSIS — R338 Other retention of urine: Secondary | ICD-10-CM | POA: Diagnosis not present

## 2021-05-28 DIAGNOSIS — I38 Endocarditis, valve unspecified: Secondary | ICD-10-CM | POA: Insufficient documentation

## 2021-05-28 NOTE — Progress Notes (Signed)
Cardiology Office Note:    Date:  05/29/2021   ID:  [name], DOB Aug 14, 1935, MRN [id number]  PCP:  Marda Stalker, PA-C  [facility] Providers Cardiologist:  Candee Furbish, MD Cardiology APP:  Sharmon Revere    Referring MD: Marda Stalker, PA-C   Chief Complaint:  F/u for valvular heart disease, AFib, CAD    Patient Profile: Aortic insufficiency S/p bioprosthetic AVR and Patent Foramen Ovale closure in 2012 Echo 3/23: Normally functioning AVR, MV repair, TV repair Severe MR, severe TR S/p MV Repair, TV Repair, MAZE, LA clipping in 01/2018 Hypertension Atenolol DCd in 3/22 due to ? HR but resumed due to uncontrolled BP Permanent atrial fibrillation/flutter Chronic anticoagulation w Warfarin Hx of R MCA CVA tx with cath based Rx Coronary artery disease Non-obstructive by cath in 10/19 Dilated thoracic aorta Echocardiogram 3/22:  root 43 mm, ascending aorta 41 mm Hx of GI bleed Borderline diabetes mellitus BPH Peripheral neuropathy L hip fracture due to mechanical fall 7/22 >> s/p L THR Low albumin  Prior CV Studies: ECHO COMPLETE WO IMAGING ENHANCING AGENT 03/22/2021 EF 60-65, no RWMA, mild concentric LVH, normal RVSF, moderately elevated PASP, RVSP 50.8, severe LAE mitral valve repair with mean gradient 9 mmHg, trivial MR, normally functioning tricuspid valve repair, normally functioning AVR, mild dilation of ascending aorta (36 mm), mild dilation of aortic root (41 mm)    Long Term Monitor 3-7 days 04/14/20 Artifact - unable to determine rhythm Avg HR 66 151 Supraventricular Tachycardia runs (avg 150) 2nd degree AVB type 1 noted during sleep PACs 6.4%, PVCs rare Poss AFib/flutter    Echocardiogram 03/16/20 EF 60-65, no RWMA, mod LVH, GLS -20.3%, normal RVSF, RVSP 35.3, severe BAE, normally functioning MV repair (mean 6 mmHg, mild MR), TV repair ok with mean 2 mmHg, AVR ok with mean 16 mmHg, DI 0.5, aortic root 43 mm, ascending aorta 41 mm, s/p  PFO closure - no residual shunt   Carotid US 01/20/2018 R ICA without stenosis; L 1-39   Cardiac catheterization 10/14/17 LCx prox to mid 30 LAD ost 30, prox 40  History of Present Illness:   [name]. is a 86 y.o. male with the above problem list.  He was last seen in Dec 2022.  He was admitted 5/14-5/17 with generalized weakness.  MRI showed an acute/subacute L frontal lobe CVA.  He did have R otitis externa and was also noted to be septic from a possible dental abscess.  He was seen by neurology who did not feel his neuro deficits were explained by the tiny L frontal lobe infarct noted on MRI.  His anticoagulation was continued.  Of note, his Lisinopril was held to allow for permissive hypertension.  He was started on rosuvastatin.  He returns for f/u.    He is here today with his wife.  Overall, he is feeling better.  He is walking with a walker to protect him from falling.  He has not had chest pain.  He has not had significant shortness of breath.  He has not had syncope, orthopnea or leg edema.    Past Medical History:  Diagnosis Date   Acquired dilation of ascending aorta and aortic root (HCC)    42m aortic root and 473mascending aorta on echo 03/2020   Bladder stones    Borderline diabetes    BPH (benign prostatic hyperplasia)    CAD (coronary artery disease) 12/19/2020   Non-obstructive coronary artery disease at cath in  2019   Coronary artery disease    cardiologist-  dr Marlou Porch  Cecille Rubin gerhart NP--- per cath 06-02-2010 non-obstructive cad pLAD 30-40%   Diverticulosis of colon    Dysrhythmia    afib   Gout    Heart murmur    History of adenomatous polyp of colon    2002-- tubular adenoma   History of aortic insufficiency    severe -- s/p  AVR 08-03-2010   History of small bowel obstruction    02/ 2007 mechanical sbo s/p  surgical intervention;  partial sbo 09/ 2011 and 03-20-2011 resolved without surgical intervention   History of urinary retention    HTN  (hypertension)    Peripheral neuropathy    Persistent atrial fibrillation (HCC)    S/P aortic valve replacement with prosthetic valve 08/03/2010   tissue valve   S/P Maze operation for atrial fibrillation 01/30/2018   Complete bilateral atrial lesion set using bipolar radiofrequency and cryothermy with clipping of LA appendage   S/P MVR (mitral valve repair) 01/30/2018   Complex valvuloplasty including artificial Gore-tex neochord placement x4 and Carbo medics Annuloflex ring annuloplasty, size 28   S/P patent foramen ovale closure 08/03/2010   at same time AVR   S/P tricuspid valve repair 01/30/2018   Using an MC3 Annuloplasty ring, size 28   Stroke ([place])    Tricuspid regurgitation    Current Medications: Current Meds  Medication Sig   allopurinol (ZYLOPRIM) 300 MG tablet Take 150 mg by mouth every morning.   amLODipine (NORVASC) 10 MG tablet TAKE 1 TABLET BY MOUTH  DAILY   amoxicillin (AMOXIL) 500 MG capsule Take 2,000 mg by mouth See admin instructions. Take 4 capsules (2000 mg) by mouth 1 hour prior to dental appointment   atenolol (TENORMIN) 25 MG tablet Take 0.5 tablets (12.5 mg total) by mouth daily.   Cinnamon 500 MG capsule Take 500 mg by mouth every morning.   ciprofloxacin-dexamethasone (CIPRODEX) OTIC suspension Place 4 drops into the right ear 2 (two) times daily.   feeding supplement (ENSURE ENLIVE / ENSURE PLUS) LIQD Take 237 mLs by mouth 2 (two) times daily between meals.   ferrous sulfate 325 (65 FE) MG EC tablet Take 325 mg by mouth 2 (two) times daily.   finasteride (PROSCAR) 5 MG tablet Take 1 tablet (5 mg total) by mouth daily.   gabapentin (NEURONTIN) 100 MG capsule Take 200 mg by mouth at bedtime.   Glucos-Chondroit-Collag-Hyal (GLUCOSAMINE CHONDROIT-COLLAGEN PO) Take 1 tablet by mouth 2 (two) times daily.   levofloxacin (LEVAQUIN) 500 MG tablet Take 1 tablet (500 mg total) by mouth daily for 6 days.   metroNIDAZOLE (FLAGYL) 500 MG tablet Take 1 tablet (500 mg  total) by mouth 2 (two) times daily for 7 days.   Multiple Vitamins-Minerals (CENTRUM SILVER ADULT 50+) TABS Take by mouth.   tamsulosin (FLOMAX) 0.4 MG CAPS capsule Take 2 capsules (0.8 mg total) by mouth daily after breakfast.   traZODone (DESYREL) 50 MG tablet Take 50 mg by mouth at bedtime as needed.   warfarin (COUMADIN) 5 MG tablet Take 1 tablet (5 mg total) by mouth daily at 4 PM. Until seen by warfarin clinic.   [DISCONTINUED] rosuvastatin (CRESTOR) 10 MG tablet Take 1 tablet (10 mg total) by mouth daily.    Allergies:   Sulfa antibiotics and Lisinopril   Social History   Tobacco Use   Smoking status: Never   Smokeless tobacco: Never  Vaping Use   Vaping Use: Never used  Substance  Use Topics   Alcohol use: Yes    Comment: ONE OR TWO PER MONTH   Drug use: No    Family Hx: The patient's family history includes Brain cancer in his father; Heart disease in his mother.  Review of Systems  HENT:  Positive for ear pain (R ear is better, still has trouble hearing).     EKGs/Labs/Other Test Reviewed:    EKG:  EKG is not ordered today.  The ekg ordered today demonstrates N/A  Recent Labs: 05/24/2021: ALT 18; BUN 17; Creatinine, Ser 1.19; Hemoglobin 11.0; Magnesium 2.2; Platelets 128; Potassium 3.6; Sodium 139   Recent Lipid Panel Recent Labs    05/22/21 0713  CHOL 130  TRIG 49  HDL 43  VLDL 10  LDLCALC 77     Risk Assessment/Calculations:    CHA2DS2-VASc Score = 6   This indicates a 9.7% annual risk of stroke. The patient's score is based upon: CHF History: 0 HTN History: 1 Diabetes History: 0 Stroke History: 2 Vascular Disease History: 1 Age Score: 2 Gender Score: 0        Physical Exam:    VS:  BP 122/60   Pulse 64   Ht 6' (1.829 m)   Wt 175 lb 3.2 oz (79.5 kg)   SpO2 97%   BMI 23.76 kg/m     Wt Readings from Last 3 Encounters:  05/29/21 175 lb 3.2 oz (79.5 kg)  05/21/21 180 lb (81.6 kg)  04/21/21 180 lb (81.6 kg)    Constitutional:       Appearance: Healthy appearance. Not in distress.  Pulmonary:     Effort: Pulmonary effort is normal.     Breath sounds: No wheezing. No rales.  Cardiovascular:     Normal rate. Regular rhythm. Normal S1. Normal S2.      Murmurs: There is a grade 2/6 systolic murmur at the URSB.  Edema:    Peripheral edema absent.  Abdominal:     Palpations: Abdomen is soft.  Skin:    General: Skin is warm and dry.  Neurological:     General: No focal deficit present.     Mental Status: Alert and oriented to person, place and time.     Cranial Nerves: Cranial nerves are intact.        ASSESSMENT & PLAN:   Acquired dilation of ascending aorta and aortic root (HCC) Ascending aorta 36 mm and aortic root 41 mm by echocardiogram in March 2023.  Plan follow-up echocardiogram in March 2024.  CAD (coronary artery disease) Nonobstructive CAD by cardiac catheterization in 2019.  He is not having anginal symptoms.  He is not on aspirin as he is on warfarin.  Continue rosuvastatin 10 mg daily.  Essential hypertension Blood pressure is well controlled.  As noted, he was taken off lisinopril in the hospital for permissive hypertension in the setting of recent stroke.  Continue amlodipine 10 mg daily, atenolol 12.5 mg daily.  Permanent atrial fibrillation (HCC) Heart rate is well controlled.  He is tolerating anticoagulation.  Coumadin is managed by our Coumadin clinic.  He has an appointment later today.  Continue Coumadin, atenolol 12.5 mg daily.  Valvular heart disease History of bioprosthetic AVR in 2012 and mitral valve repair, tricuspid valve repair in 2020.  Recent echocardiogram with stable AVR, mitral valve repair and tricuspid valve repair.  Continue SBE prophylaxis.  History of cerebrovascular accident As noted, he had a recent admission with generalized weakness in the setting of recent small left frontal  lobe CVA as well as otitis externa and dental abscess.  Follow-up with primary care and neurology  as planned.  Hypoalbuminemia due to protein-calorie malnutrition [facility]) This was noted in the hospital and he was asked to supplement his diet.  I have encouraged him to drink 1-2 Ensure/Boost per day.          Dispo:  Return in about 6 months (around 11/29/2021) for Routine Follow Up, w/ [name], PA-C.   Medication Adjustments/Labs and Tests Ordered: Current medicines are reviewed at length with the patient today.  Concerns regarding medicines are outlined above.  Tests Ordered: Orders Placed This Encounter  Procedures   Comp Met (CMET)   Lipid panel   Medication Changes: Meds ordered this encounter  Medications   rosuvastatin (CRESTOR) 10 MG tablet    Sig: Take 1 tablet (10 mg total) by mouth daily.    Dispense:  90 tablet    Refill:  3    NOTE LISINOPRIL HAS BEEN DISCONTINUED, PLEASE STOP FILLING IT PER PT   Signed, [name], PA-C  05/29/2021 11:57 AM    [facility] [place], [place],   [address] Phone: [phone]; Fax: [phone]

## 2021-05-29 ENCOUNTER — Other Ambulatory Visit: Payer: Self-pay | Admitting: *Deleted

## 2021-05-29 ENCOUNTER — Ambulatory Visit: Payer: Medicare Other | Admitting: Physician Assistant

## 2021-05-29 ENCOUNTER — Ambulatory Visit (INDEPENDENT_AMBULATORY_CARE_PROVIDER_SITE_OTHER): Payer: Medicare Other | Admitting: Pharmacist

## 2021-05-29 ENCOUNTER — Encounter: Payer: Self-pay | Admitting: Physician Assistant

## 2021-05-29 VITALS — BP 122/60 | HR 64 | Ht 72.0 in | Wt 175.2 lb

## 2021-05-29 DIAGNOSIS — I77819 Aortic ectasia, unspecified site: Secondary | ICD-10-CM | POA: Diagnosis not present

## 2021-05-29 DIAGNOSIS — Z8673 Personal history of transient ischemic attack (TIA), and cerebral infarction without residual deficits: Secondary | ICD-10-CM

## 2021-05-29 DIAGNOSIS — I4821 Permanent atrial fibrillation: Secondary | ICD-10-CM

## 2021-05-29 DIAGNOSIS — E46 Unspecified protein-calorie malnutrition: Secondary | ICD-10-CM

## 2021-05-29 DIAGNOSIS — I38 Endocarditis, valve unspecified: Secondary | ICD-10-CM

## 2021-05-29 DIAGNOSIS — E8809 Other disorders of plasma-protein metabolism, not elsewhere classified: Secondary | ICD-10-CM | POA: Diagnosis not present

## 2021-05-29 DIAGNOSIS — Z7901 Long term (current) use of anticoagulants: Secondary | ICD-10-CM | POA: Diagnosis not present

## 2021-05-29 DIAGNOSIS — I10 Essential (primary) hypertension: Secondary | ICD-10-CM

## 2021-05-29 DIAGNOSIS — Z9889 Other specified postprocedural states: Secondary | ICD-10-CM

## 2021-05-29 DIAGNOSIS — I63411 Cerebral infarction due to embolism of right middle cerebral artery: Secondary | ICD-10-CM

## 2021-05-29 DIAGNOSIS — I4811 Longstanding persistent atrial fibrillation: Secondary | ICD-10-CM | POA: Diagnosis not present

## 2021-05-29 DIAGNOSIS — I2510 Atherosclerotic heart disease of native coronary artery without angina pectoris: Secondary | ICD-10-CM | POA: Diagnosis not present

## 2021-05-29 LAB — POCT INR: INR: 3.2 — AB (ref 2.0–3.0)

## 2021-05-29 MED ORDER — ROSUVASTATIN CALCIUM 10 MG PO TABS: 10.0000 mg | ORAL_TABLET | Freq: Every day | ORAL | 3 refills | Status: DC

## 2021-05-29 NOTE — Assessment & Plan Note (Signed)
As noted, he had a recent admission with generalized weakness in the setting of recent small left frontal lobe CVA as well as otitis externa and dental abscess.  Follow-up with primary care and neurology as planned.

## 2021-05-29 NOTE — Assessment & Plan Note (Signed)
Heart rate is well controlled.  He is tolerating anticoagulation.  Coumadin is managed by our Coumadin clinic.  He has an appointment later today.  Continue Coumadin, atenolol 12.5 mg daily.

## 2021-05-29 NOTE — Patient Instructions (Signed)
Medication Instructions:  Your physician recommends that you continue on your current medications as directed. Please refer to the Current Medication list given to you today.  *If you need a refill on your cardiac medications before your next appointment, please call your pharmacy*   Lab Work: 3 MONTHS, ON SAME DAY AS A COUMADIN?  FASTING LIPID & CMET  If you have labs (blood work) drawn today and your tests are completely normal, you will receive your results only by: [place] (if you have MyChart) OR A paper copy in the mail If you have any lab test that is abnormal or we need to change your treatment, we will call you to review the results.   Testing/Procedures: None ordered   Follow-Up: At [facility], you and your health needs are our priority.  As part of our continuing mission to provide you with exceptional heart care, we have created designated Provider Care Teams.  These Care Teams include your primary Cardiologist (physician) and Advanced Practice Providers (APPs -  Physician Assistants and Nurse Practitioners) who all work together to provide you with the care you need, when you need it.  We recommend signing up for the patient portal called "MyChart".  Sign up information is provided on this After Visit Summary.  MyChart is used to connect with patients for Virtual Visits (Telemedicine).  Patients are able to view lab/test results, encounter notes, upcoming appointments, etc.  Non-urgent messages can be sent to your provider as well.   To learn more about what you can do with MyChart, go to [email or web address].    Your next appointment:   6 month(s)  11/29/21 arrive at 9:00   The format for your next appointment:   In Person  Provider:   [name], PA-C         Other Instructions   Important Information About Sugar

## 2021-05-29 NOTE — Assessment & Plan Note (Signed)
This was noted in the hospital and he was asked to supplement his diet.  I have encouraged him to drink 1-2 Ensure/Boost per day.

## 2021-05-29 NOTE — Assessment & Plan Note (Signed)
History of bioprosthetic AVR in 2012 and mitral valve repair, tricuspid valve repair in 2020.  Recent echocardiogram with stable AVR, mitral valve repair and tricuspid valve repair.  Continue SBE prophylaxis.

## 2021-05-29 NOTE — Patient Instructions (Signed)
Take 1 tablet today and eat a serving of greens, then continue taking warfarin 1 tablet daily except for 1.5 tablets on Mondays. Recheck INR in 2 weeks. Coumadin Clinic [phone].

## 2021-05-29 NOTE — Assessment & Plan Note (Signed)
Blood pressure is well controlled.  As noted, he was taken off lisinopril in the hospital for permissive hypertension in the setting of recent stroke.  Continue amlodipine 10 mg daily, atenolol 12.5 mg daily.

## 2021-05-29 NOTE — Assessment & Plan Note (Addendum)
Ascending aorta 36 mm and aortic root 41 mm by echocardiogram in March 2023.  Plan follow-up echocardiogram in March 2024. 

## 2021-05-29 NOTE — Assessment & Plan Note (Signed)
Nonobstructive CAD by cardiac catheterization in 2019.  He is not having anginal symptoms.  He is not on aspirin as he is on warfarin.  Continue rosuvastatin 10 mg daily. 

## 2021-05-31 DIAGNOSIS — H6091 Unspecified otitis externa, right ear: Secondary | ICD-10-CM | POA: Diagnosis not present

## 2021-05-31 DIAGNOSIS — M109 Gout, unspecified: Secondary | ICD-10-CM | POA: Diagnosis not present

## 2021-05-31 DIAGNOSIS — I69398 Other sequelae of cerebral infarction: Secondary | ICD-10-CM | POA: Diagnosis not present

## 2021-05-31 DIAGNOSIS — I110 Hypertensive heart disease with heart failure: Secondary | ICD-10-CM | POA: Diagnosis not present

## 2021-05-31 DIAGNOSIS — D696 Thrombocytopenia, unspecified: Secondary | ICD-10-CM | POA: Diagnosis not present

## 2021-05-31 DIAGNOSIS — I4821 Permanent atrial fibrillation: Secondary | ICD-10-CM | POA: Diagnosis not present

## 2021-05-31 DIAGNOSIS — K047 Periapical abscess without sinus: Secondary | ICD-10-CM | POA: Diagnosis not present

## 2021-05-31 DIAGNOSIS — K5790 Diverticulosis of intestine, part unspecified, without perforation or abscess without bleeding: Secondary | ICD-10-CM | POA: Diagnosis not present

## 2021-05-31 DIAGNOSIS — R531 Weakness: Secondary | ICD-10-CM | POA: Diagnosis not present

## 2021-05-31 DIAGNOSIS — I081 Rheumatic disorders of both mitral and tricuspid valves: Secondary | ICD-10-CM | POA: Diagnosis not present

## 2021-05-31 DIAGNOSIS — G629 Polyneuropathy, unspecified: Secondary | ICD-10-CM | POA: Diagnosis not present

## 2021-05-31 DIAGNOSIS — D500 Iron deficiency anemia secondary to blood loss (chronic): Secondary | ICD-10-CM | POA: Diagnosis not present

## 2021-05-31 DIAGNOSIS — I69328 Other speech and language deficits following cerebral infarction: Secondary | ICD-10-CM | POA: Diagnosis not present

## 2021-05-31 DIAGNOSIS — I2510 Atherosclerotic heart disease of native coronary artery without angina pectoris: Secondary | ICD-10-CM | POA: Diagnosis not present

## 2021-05-31 DIAGNOSIS — Z7901 Long term (current) use of anticoagulants: Secondary | ICD-10-CM | POA: Diagnosis not present

## 2021-05-31 DIAGNOSIS — R7303 Prediabetes: Secondary | ICD-10-CM | POA: Diagnosis not present

## 2021-05-31 DIAGNOSIS — I509 Heart failure, unspecified: Secondary | ICD-10-CM | POA: Diagnosis not present

## 2021-05-31 DIAGNOSIS — I69392 Facial weakness following cerebral infarction: Secondary | ICD-10-CM | POA: Diagnosis not present

## 2021-05-31 DIAGNOSIS — I77819 Aortic ectasia, unspecified site: Secondary | ICD-10-CM | POA: Diagnosis not present

## 2021-05-31 DIAGNOSIS — R338 Other retention of urine: Secondary | ICD-10-CM | POA: Diagnosis not present

## 2021-05-31 DIAGNOSIS — Z9181 History of falling: Secondary | ICD-10-CM | POA: Diagnosis not present

## 2021-06-01 DIAGNOSIS — I110 Hypertensive heart disease with heart failure: Secondary | ICD-10-CM | POA: Diagnosis not present

## 2021-06-01 DIAGNOSIS — K5790 Diverticulosis of intestine, part unspecified, without perforation or abscess without bleeding: Secondary | ICD-10-CM | POA: Diagnosis not present

## 2021-06-01 DIAGNOSIS — G629 Polyneuropathy, unspecified: Secondary | ICD-10-CM | POA: Diagnosis not present

## 2021-06-01 DIAGNOSIS — R531 Weakness: Secondary | ICD-10-CM | POA: Diagnosis not present

## 2021-06-01 DIAGNOSIS — R7303 Prediabetes: Secondary | ICD-10-CM | POA: Diagnosis not present

## 2021-06-01 DIAGNOSIS — I69398 Other sequelae of cerebral infarction: Secondary | ICD-10-CM | POA: Diagnosis not present

## 2021-06-01 DIAGNOSIS — Z9181 History of falling: Secondary | ICD-10-CM | POA: Diagnosis not present

## 2021-06-01 DIAGNOSIS — I2510 Atherosclerotic heart disease of native coronary artery without angina pectoris: Secondary | ICD-10-CM | POA: Diagnosis not present

## 2021-06-01 DIAGNOSIS — Z7901 Long term (current) use of anticoagulants: Secondary | ICD-10-CM | POA: Diagnosis not present

## 2021-06-01 DIAGNOSIS — D696 Thrombocytopenia, unspecified: Secondary | ICD-10-CM | POA: Diagnosis not present

## 2021-06-01 DIAGNOSIS — I69392 Facial weakness following cerebral infarction: Secondary | ICD-10-CM | POA: Diagnosis not present

## 2021-06-01 DIAGNOSIS — R338 Other retention of urine: Secondary | ICD-10-CM | POA: Diagnosis not present

## 2021-06-01 DIAGNOSIS — I509 Heart failure, unspecified: Secondary | ICD-10-CM | POA: Diagnosis not present

## 2021-06-01 DIAGNOSIS — I4821 Permanent atrial fibrillation: Secondary | ICD-10-CM | POA: Diagnosis not present

## 2021-06-01 DIAGNOSIS — H6091 Unspecified otitis externa, right ear: Secondary | ICD-10-CM | POA: Diagnosis not present

## 2021-06-01 DIAGNOSIS — I69328 Other speech and language deficits following cerebral infarction: Secondary | ICD-10-CM | POA: Diagnosis not present

## 2021-06-01 DIAGNOSIS — M109 Gout, unspecified: Secondary | ICD-10-CM | POA: Diagnosis not present

## 2021-06-01 DIAGNOSIS — D500 Iron deficiency anemia secondary to blood loss (chronic): Secondary | ICD-10-CM | POA: Diagnosis not present

## 2021-06-01 DIAGNOSIS — I081 Rheumatic disorders of both mitral and tricuspid valves: Secondary | ICD-10-CM | POA: Diagnosis not present

## 2021-06-01 DIAGNOSIS — K047 Periapical abscess without sinus: Secondary | ICD-10-CM | POA: Diagnosis not present

## 2021-06-01 DIAGNOSIS — I77819 Aortic ectasia, unspecified site: Secondary | ICD-10-CM | POA: Diagnosis not present

## 2021-06-01 NOTE — Patient Outreach (Signed)
Received a red flag Emmi stroke notification for Mr. Atkerson . I have assigned Joellyn Quails, RN to call for follow up and determine if there are any Case Management needs.    [facility], [place], [place] Management [phone]

## 2021-06-02 ENCOUNTER — Other Ambulatory Visit: Payer: Self-pay | Admitting: *Deleted

## 2021-06-02 ENCOUNTER — Encounter: Payer: Self-pay | Admitting: *Deleted

## 2021-06-02 DIAGNOSIS — R9389 Abnormal findings on diagnostic imaging of other specified body structures: Secondary | ICD-10-CM | POA: Insufficient documentation

## 2021-06-02 NOTE — Patient Outreach (Addendum)
[facility]) Care Management Telephonic RN Care Manager Note   06/02/2021 Name:  [name]. MRN:  [id number] DOB:  1935/12/25  Summary: Follow up for pt stroke EMMI outreach on 05/31/2021 10 am Questions/problems with meds? Yes Referred to RN CM on 06/01/21   Question about his ear gtts He wanted to know when to stop using the gtts He reports no further ear infection symptoms ( no nausea, ringing, dizziness etc) To be seen by his pcp next week  Little fatigue is the only symptoms being reported and he states he feels fatigue after having therapy on 06/01/21 and the SW visit on 06/01/21   Further Question related to a durable medical equipment (DME) bill for $6.94 co pay on the walker account number [id number]  He confirms the walker was brought to his room at the hospital but he refused it and did not bring it home as he had one at home Provided permission for RN CM to outreach to [employer]    Recommendations/Changes made from today's visit: Followed up on his EMMI stroke question about his ear gtts Encouraged him to continue taking until the bottle is empty or until he is able to see his pcp next week for re evaluation  Reviewed [facility] EMMI services possible further follow up plus provided RN CM number mailed welcome letter with 24 hr nurse information  Outreach to [employer] Vania Rea at [phone] about pt bill he received  She will call the local office to adjust the bill She provided the local office number of [phone] and transferred me to the local office staff Gerhard Perches who reports it still appears the pt was given the walker by the hospital staff and it would need to be re adjusted on the hospital side. Discussed with Gerhard Perches the 05/24/21 EPIC noted by Phoebe Perch, RN stating pt refused the walker and it was given back to hospital staff.  He did not leave the hospital with the walker. RN CM sent e-mail to the RN CM and RN active with the patient on the day of discharge (05/24/21  Jolyn Nap and Bettina Gavia F)  Subjective: Gerald Amas. is an 85 y.o. year old male who is a primary patient of Marda Stalker, [place]. The care management team was consulted for assistance with care management and/or care coordination needs.    Telephonic RN Care Manager completed Telephone Visit today.   Objective:  Medications Reviewed Today     Reviewed by Sharmon Revere (Physician Assistant) on 05/29/21 at 1145  Med List Status: <None>   Medication Order Taking? Sig Documenting Provider Last Dose Status Informant  allopurinol (ZYLOPRIM) 300 MG tablet [id number] Yes Take 150 mg by mouth every morning. [provider] Taking Active Self  amLODipine (NORVASC) 10 MG tablet [id number] Yes TAKE 1 TABLET BY MOUTH  DAILY [name] T, PA-C Taking Active Self  amoxicillin (AMOXIL) 500 MG capsule [id number] Yes Take 2,000 mg by mouth See admin instructions. Take 4 capsules (2000 mg) by mouth 1 hour prior to dental appointment [provider] Taking Active Self  atenolol (TENORMIN) 25 MG tablet [id number] Yes Take 0.5 tablets (12.5 mg total) by mouth daily. Lavina Hamman, MD Taking Active   Cinnamon 500 MG capsule [id number] Yes Take 500 mg by mouth every morning. [provider] Taking Active Self  ciprofloxacin-dexamethasone (CIPRODEX) OTIC suspension [id number] Yes Place 4 drops into the right ear 2 (two) times daily. Lavina Hamman,  MD Taking Active   feeding supplement (ENSURE ENLIVE / ENSURE PLUS) LIQD [id number] Yes Take 237 mLs by mouth 2 (two) times daily between meals. Lavina Hamman, MD Taking Active   ferrous sulfate 325 (65 FE) MG EC tablet [id number] Yes Take 325 mg by mouth 2 (two) times daily. [provider] Taking Active Self  finasteride (PROSCAR) 5 MG tablet [id number] Yes Take 1 tablet (5 mg total) by mouth daily. Cathlyn Parsons, PA-C Taking Active Self  gabapentin (NEURONTIN) 100 MG capsule [id number] Yes Take 200 mg by mouth at  bedtime. [provider] Taking Active Self  Glucos-Chondroit-Collag-Hyal (GLUCOSAMINE CHONDROIT-COLLAGEN PO) [id number] Yes Take 1 tablet by mouth 2 (two) times daily. [provider] Taking Active Self  levofloxacin (LEVAQUIN) 500 MG tablet [id number] Yes Take 1 tablet (500 mg total) by mouth daily for 6 days. Lavina Hamman, MD Taking Active   metroNIDAZOLE (FLAGYL) 500 MG tablet [id number] Yes Take 1 tablet (500 mg total) by mouth 2 (two) times daily for 7 days. Lavina Hamman, MD Taking Active   Multiple Vitamins-Minerals (CENTRUM SILVER ADULT 50+) TABS [id number] Yes Take by mouth. [provider] Taking Active Self  rosuvastatin (CRESTOR) 10 MG tablet [id number]  Take 1 tablet (10 mg total) by mouth daily. [name] T, PA-C  Active   tamsulosin [facility]) 0.4 MG CAPS capsule [id number] Yes Take 2 capsules (0.8 mg total) by mouth daily after breakfast. Cathlyn Parsons, PA-C Taking Active Self  traZODone (DESYREL) 50 MG tablet [id number] Yes Take 50 mg by mouth at bedtime as needed. [provider] Taking Active Self  warfarin (COUMADIN) 5 MG tablet [id number] Yes Take 1 tablet (5 mg total) by mouth daily at 4 PM. Until seen by warfarin clinic. Lavina Hamman, MD Taking Active            Patient Active Problem List   Diagnosis Date Noted   Hypoalbuminemia due to protein-calorie malnutrition ([place]) 05/29/2021   Valvular heart disease 05/28/2021   Acute CVA (cerebrovascular accident) ([place]) 05/21/2021   SIRS (systemic inflammatory response syndrome) ([place]) 05/21/2021   Generalized weakness 05/21/2021   Otitis externa 05/21/2021   CAD (coronary artery disease) 12/19/2020   Malnutrition of moderate degree 08/02/2020   S/P left total hip arthroplasty 08/02/2020   Closed left hip fracture, initial encounter ([place]) 08/01/2020   Chronic congestive heart failure ([place]) 05/26/2020   Elevated PSA 05/26/2020   History of adenomatous polyp of colon 05/26/2020    History of anemia 05/26/2020   Hypercoagulable state ([place]) 05/26/2020   Impairment of balance 05/26/2020   Peripheral neuropathy 05/26/2020   Personal history of other malignant neoplasm of skin 05/26/2020   Prediabetes 05/26/2020   Presence of prosthetic heart valve 05/26/2020   Sleep disorder 05/26/2020   Thrombocytopenia ([place]) 05/26/2020   Vitamin D deficiency 05/26/2020   Acquired dilation of ascending aorta and aortic root (HCC)    Symptomatic anemia 09/05/2018   Anemia due to chronic blood loss 09/05/2018   Long term current use of anticoagulant 02/10/2018   s/p mitral valve repair 01/30/2018   S/P tricuspid valve repair 01/30/2018   S/P Maze operation for atrial fibrillation 01/30/2018   Tricuspid regurgitation    Mitral valve regurgitation 10/14/2017   History of cerebrovascular accident 11/27/2016   Permanent atrial fibrillation (HCC)    Hyperglycemia    Right middle cerebral artery stroke ([place]) 76/28/3151   Acute embolic stroke (HCC)    Hypokalemia    Left hemiparesis ([place])  Dysphagia, post-stroke    S/P AVR (aortic valve replacement)    Benign prostatic hyperplasia with lower urinary tract symptoms    CVA (cerebral vascular accident) ([place]) 10/24/2016   Essential hypertension    Gout    History of small bowel obstruction    History of BPH    S/P AVR 08/03/2010   Hypertrophy of prostate with urinary obstruction and other lower urinary tract symptoms (LUTS) 05/21/2001     SDOH:  (Social Determinants of Health) assessments and interventions performed:    Care Plan  Review of patient past medical history, allergies, medications, health status, including review of consultants reports, laboratory and other test data, was performed as part of comprehensive evaluation for care management services.   There are no care plans that you recently modified to display for this patient.    Plan: The patient has been provided with contact information for the care management  team and has been advised to call with any health related questions or concerns.  The care management team will reach out to the patient again over the next 14-30+ business  days.  [place]ai Yard L. Lavina Hamman, RN, BSN, [place] Coordinator Office number [phone] [facility] number [phone] Fax number [phone]

## 2021-06-06 DIAGNOSIS — I5032 Chronic diastolic (congestive) heart failure: Secondary | ICD-10-CM | POA: Diagnosis not present

## 2021-06-06 DIAGNOSIS — D649 Anemia, unspecified: Secondary | ICD-10-CM | POA: Diagnosis not present

## 2021-06-06 DIAGNOSIS — D696 Thrombocytopenia, unspecified: Secondary | ICD-10-CM | POA: Diagnosis not present

## 2021-06-06 DIAGNOSIS — I10 Essential (primary) hypertension: Secondary | ICD-10-CM | POA: Diagnosis not present

## 2021-06-06 DIAGNOSIS — I6389 Other cerebral infarction: Secondary | ICD-10-CM | POA: Diagnosis not present

## 2021-06-07 ENCOUNTER — Ambulatory Visit: Payer: Medicare Other | Admitting: Physician Assistant

## 2021-06-08 DIAGNOSIS — I69398 Other sequelae of cerebral infarction: Secondary | ICD-10-CM | POA: Diagnosis not present

## 2021-06-08 DIAGNOSIS — I69328 Other speech and language deficits following cerebral infarction: Secondary | ICD-10-CM | POA: Diagnosis not present

## 2021-06-08 DIAGNOSIS — R531 Weakness: Secondary | ICD-10-CM | POA: Diagnosis not present

## 2021-06-08 DIAGNOSIS — Z9181 History of falling: Secondary | ICD-10-CM | POA: Diagnosis not present

## 2021-06-08 DIAGNOSIS — R338 Other retention of urine: Secondary | ICD-10-CM | POA: Diagnosis not present

## 2021-06-08 DIAGNOSIS — I77819 Aortic ectasia, unspecified site: Secondary | ICD-10-CM | POA: Diagnosis not present

## 2021-06-08 DIAGNOSIS — I69392 Facial weakness following cerebral infarction: Secondary | ICD-10-CM | POA: Diagnosis not present

## 2021-06-08 DIAGNOSIS — G629 Polyneuropathy, unspecified: Secondary | ICD-10-CM | POA: Diagnosis not present

## 2021-06-08 DIAGNOSIS — I110 Hypertensive heart disease with heart failure: Secondary | ICD-10-CM | POA: Diagnosis not present

## 2021-06-08 DIAGNOSIS — Z7901 Long term (current) use of anticoagulants: Secondary | ICD-10-CM | POA: Diagnosis not present

## 2021-06-08 DIAGNOSIS — K047 Periapical abscess without sinus: Secondary | ICD-10-CM | POA: Diagnosis not present

## 2021-06-08 DIAGNOSIS — M109 Gout, unspecified: Secondary | ICD-10-CM | POA: Diagnosis not present

## 2021-06-08 DIAGNOSIS — I4821 Permanent atrial fibrillation: Secondary | ICD-10-CM | POA: Diagnosis not present

## 2021-06-08 DIAGNOSIS — D500 Iron deficiency anemia secondary to blood loss (chronic): Secondary | ICD-10-CM | POA: Diagnosis not present

## 2021-06-08 DIAGNOSIS — I509 Heart failure, unspecified: Secondary | ICD-10-CM | POA: Diagnosis not present

## 2021-06-08 DIAGNOSIS — R7303 Prediabetes: Secondary | ICD-10-CM | POA: Diagnosis not present

## 2021-06-08 DIAGNOSIS — I081 Rheumatic disorders of both mitral and tricuspid valves: Secondary | ICD-10-CM | POA: Diagnosis not present

## 2021-06-08 DIAGNOSIS — H6091 Unspecified otitis externa, right ear: Secondary | ICD-10-CM | POA: Diagnosis not present

## 2021-06-08 DIAGNOSIS — I2510 Atherosclerotic heart disease of native coronary artery without angina pectoris: Secondary | ICD-10-CM | POA: Diagnosis not present

## 2021-06-08 DIAGNOSIS — K5790 Diverticulosis of intestine, part unspecified, without perforation or abscess without bleeding: Secondary | ICD-10-CM | POA: Diagnosis not present

## 2021-06-08 DIAGNOSIS — D696 Thrombocytopenia, unspecified: Secondary | ICD-10-CM | POA: Diagnosis not present

## 2021-06-12 DIAGNOSIS — I081 Rheumatic disorders of both mitral and tricuspid valves: Secondary | ICD-10-CM | POA: Diagnosis not present

## 2021-06-12 DIAGNOSIS — D696 Thrombocytopenia, unspecified: Secondary | ICD-10-CM | POA: Diagnosis not present

## 2021-06-12 DIAGNOSIS — I69392 Facial weakness following cerebral infarction: Secondary | ICD-10-CM | POA: Diagnosis not present

## 2021-06-12 DIAGNOSIS — K047 Periapical abscess without sinus: Secondary | ICD-10-CM | POA: Diagnosis not present

## 2021-06-12 DIAGNOSIS — R531 Weakness: Secondary | ICD-10-CM | POA: Diagnosis not present

## 2021-06-12 DIAGNOSIS — I509 Heart failure, unspecified: Secondary | ICD-10-CM | POA: Diagnosis not present

## 2021-06-12 DIAGNOSIS — G629 Polyneuropathy, unspecified: Secondary | ICD-10-CM | POA: Diagnosis not present

## 2021-06-12 DIAGNOSIS — M109 Gout, unspecified: Secondary | ICD-10-CM | POA: Diagnosis not present

## 2021-06-12 DIAGNOSIS — Z9181 History of falling: Secondary | ICD-10-CM | POA: Diagnosis not present

## 2021-06-12 DIAGNOSIS — I110 Hypertensive heart disease with heart failure: Secondary | ICD-10-CM | POA: Diagnosis not present

## 2021-06-12 DIAGNOSIS — I69328 Other speech and language deficits following cerebral infarction: Secondary | ICD-10-CM | POA: Diagnosis not present

## 2021-06-12 DIAGNOSIS — K5790 Diverticulosis of intestine, part unspecified, without perforation or abscess without bleeding: Secondary | ICD-10-CM | POA: Diagnosis not present

## 2021-06-12 DIAGNOSIS — I4821 Permanent atrial fibrillation: Secondary | ICD-10-CM | POA: Diagnosis not present

## 2021-06-12 DIAGNOSIS — I69398 Other sequelae of cerebral infarction: Secondary | ICD-10-CM | POA: Diagnosis not present

## 2021-06-12 DIAGNOSIS — I2510 Atherosclerotic heart disease of native coronary artery without angina pectoris: Secondary | ICD-10-CM | POA: Diagnosis not present

## 2021-06-12 DIAGNOSIS — I77819 Aortic ectasia, unspecified site: Secondary | ICD-10-CM | POA: Diagnosis not present

## 2021-06-12 DIAGNOSIS — Z7901 Long term (current) use of anticoagulants: Secondary | ICD-10-CM | POA: Diagnosis not present

## 2021-06-12 DIAGNOSIS — R7303 Prediabetes: Secondary | ICD-10-CM | POA: Diagnosis not present

## 2021-06-12 DIAGNOSIS — R338 Other retention of urine: Secondary | ICD-10-CM | POA: Diagnosis not present

## 2021-06-12 DIAGNOSIS — H6091 Unspecified otitis externa, right ear: Secondary | ICD-10-CM | POA: Diagnosis not present

## 2021-06-12 DIAGNOSIS — D500 Iron deficiency anemia secondary to blood loss (chronic): Secondary | ICD-10-CM | POA: Diagnosis not present

## 2021-06-13 ENCOUNTER — Ambulatory Visit (INDEPENDENT_AMBULATORY_CARE_PROVIDER_SITE_OTHER): Payer: Medicare Other | Admitting: Pharmacist

## 2021-06-13 DIAGNOSIS — I4811 Longstanding persistent atrial fibrillation: Secondary | ICD-10-CM

## 2021-06-13 DIAGNOSIS — Z7901 Long term (current) use of anticoagulants: Secondary | ICD-10-CM | POA: Diagnosis not present

## 2021-06-13 DIAGNOSIS — I63411 Cerebral infarction due to embolism of right middle cerebral artery: Secondary | ICD-10-CM | POA: Diagnosis not present

## 2021-06-13 DIAGNOSIS — Z9889 Other specified postprocedural states: Secondary | ICD-10-CM | POA: Diagnosis not present

## 2021-06-13 LAB — POCT INR: INR: 2.4 (ref 2.0–3.0)

## 2021-06-13 NOTE — Patient Instructions (Signed)
Description   Continue taking warfarin 1 tablet daily except for 1.5 tablets on Mondays. Recheck INR in 4 weeks. Coumadin Clinic [phone].       

## 2021-06-14 ENCOUNTER — Inpatient Hospital Stay: Payer: Medicare Other | Admitting: Nurse Practitioner

## 2021-06-16 ENCOUNTER — Other Ambulatory Visit: Payer: Self-pay | Admitting: *Deleted

## 2021-06-16 DIAGNOSIS — Z7901 Long term (current) use of anticoagulants: Secondary | ICD-10-CM | POA: Diagnosis not present

## 2021-06-16 DIAGNOSIS — G629 Polyneuropathy, unspecified: Secondary | ICD-10-CM | POA: Diagnosis not present

## 2021-06-16 DIAGNOSIS — Z9181 History of falling: Secondary | ICD-10-CM | POA: Diagnosis not present

## 2021-06-16 DIAGNOSIS — I110 Hypertensive heart disease with heart failure: Secondary | ICD-10-CM | POA: Diagnosis not present

## 2021-06-16 DIAGNOSIS — D500 Iron deficiency anemia secondary to blood loss (chronic): Secondary | ICD-10-CM | POA: Diagnosis not present

## 2021-06-16 DIAGNOSIS — I69392 Facial weakness following cerebral infarction: Secondary | ICD-10-CM | POA: Diagnosis not present

## 2021-06-16 DIAGNOSIS — I509 Heart failure, unspecified: Secondary | ICD-10-CM | POA: Diagnosis not present

## 2021-06-16 DIAGNOSIS — I2510 Atherosclerotic heart disease of native coronary artery without angina pectoris: Secondary | ICD-10-CM | POA: Diagnosis not present

## 2021-06-16 DIAGNOSIS — R531 Weakness: Secondary | ICD-10-CM | POA: Diagnosis not present

## 2021-06-16 DIAGNOSIS — R7303 Prediabetes: Secondary | ICD-10-CM | POA: Diagnosis not present

## 2021-06-16 DIAGNOSIS — K047 Periapical abscess without sinus: Secondary | ICD-10-CM | POA: Diagnosis not present

## 2021-06-16 DIAGNOSIS — K5790 Diverticulosis of intestine, part unspecified, without perforation or abscess without bleeding: Secondary | ICD-10-CM | POA: Diagnosis not present

## 2021-06-16 DIAGNOSIS — I77819 Aortic ectasia, unspecified site: Secondary | ICD-10-CM | POA: Diagnosis not present

## 2021-06-16 DIAGNOSIS — R338 Other retention of urine: Secondary | ICD-10-CM | POA: Diagnosis not present

## 2021-06-16 DIAGNOSIS — I69328 Other speech and language deficits following cerebral infarction: Secondary | ICD-10-CM | POA: Diagnosis not present

## 2021-06-16 DIAGNOSIS — M109 Gout, unspecified: Secondary | ICD-10-CM | POA: Diagnosis not present

## 2021-06-16 DIAGNOSIS — D696 Thrombocytopenia, unspecified: Secondary | ICD-10-CM | POA: Diagnosis not present

## 2021-06-16 DIAGNOSIS — I69398 Other sequelae of cerebral infarction: Secondary | ICD-10-CM | POA: Diagnosis not present

## 2021-06-16 DIAGNOSIS — I081 Rheumatic disorders of both mitral and tricuspid valves: Secondary | ICD-10-CM | POA: Diagnosis not present

## 2021-06-16 DIAGNOSIS — H6091 Unspecified otitis externa, right ear: Secondary | ICD-10-CM | POA: Diagnosis not present

## 2021-06-16 DIAGNOSIS — I4821 Permanent atrial fibrillation: Secondary | ICD-10-CM | POA: Diagnosis not present

## 2021-06-16 NOTE — Patient Outreach (Signed)
[facility]) Care Management Telephonic RN Care Manager Note   06/16/2021 Name:  [name]. MRN:  [id number] DOB:  1935-09-15  Summary: EMMI stroke follow up for DME and ear symptom concerns 06/13/21 1233 RN CM received email from discharging inpatient RN CM indicating she would make sure [employer] receives the walker back.  Mr Mcnatt confirms he is doing better except he continues to have some decrease hearing He plans to schedule a primary care provider (PCP) appointment for evaluation and referral to ENT if needed. He denies pain  He voices appreciation of the care coordination related to the concern with the bill for the walker he did not receive at discharge   He confirms he continues to be active with his home health PT He was seen today He discussed his anticipation of possible knee procedure He reports he needs to be seen to discuss a possible date for a procedure   Recommendations/Changes made from today's visit: Follow up and completion of EMMI stroke  Updated Mr Marcella of the result of the care coordination interventions to the durable medical equipment (DME) agency, discharging RN and case manager about the walker he did not receive. Assessed for any worsening symptoms  Reviewed further outreach   Subjective: [name]. is an 86 y.o. year old male who is a primary patient of Marda Stalker, [place]. The care management team was consulted for assistance with care management and/or care coordination needs.    Telephonic RN Care Manager completed Telephone Visit today.   Objective:  Medications Reviewed Today     Reviewed by Sharmon Revere (Physician Assistant) on 05/29/21 at 1145  Med List Status: <None>   Medication Order Taking? Sig Documenting Provider Last Dose Status Informant  allopurinol (ZYLOPRIM) 300 MG tablet [id number] Yes Take 150 mg by mouth every morning. [provider] Taking Active Self  amLODipine  (NORVASC) 10 MG tablet [id number] Yes TAKE 1 TABLET BY MOUTH  DAILY [name] T, PA-C Taking Active Self  amoxicillin (AMOXIL) 500 MG capsule [id number] Yes Take 2,000 mg by mouth See admin instructions. Take 4 capsules (2000 mg) by mouth 1 hour prior to dental appointment [provider] Taking Active Self  atenolol (TENORMIN) 25 MG tablet [id number] Yes Take 0.5 tablets (12.5 mg total) by mouth daily. Lavina Hamman, MD Taking Active   Cinnamon 500 MG capsule [id number] Yes Take 500 mg by mouth every morning. [provider] Taking Active Self  ciprofloxacin-dexamethasone (CIPRODEX) OTIC suspension [id number] Yes Place 4 drops into the right ear 2 (two) times daily. Lavina Hamman, MD Taking Active   feeding supplement (ENSURE ENLIVE / ENSURE PLUS) LIQD [id number] Yes Take 237 mLs by mouth 2 (two) times daily between meals. Lavina Hamman, MD Taking Active   ferrous sulfate 325 (65 FE) MG EC tablet [id number] Yes Take 325 mg by mouth 2 (two) times daily. [provider] Taking Active Self  finasteride (PROSCAR) 5 MG tablet [id number] Yes Take 1 tablet (5 mg total) by mouth daily. Cathlyn Parsons, PA-C Taking Active Self  gabapentin (NEURONTIN) 100 MG capsule [id number] Yes Take 200 mg by mouth at bedtime. [provider] Taking Active Self  Glucos-Chondroit-Collag-Hyal (GLUCOSAMINE CHONDROIT-COLLAGEN PO) [id number] Yes Take 1 tablet by mouth 2 (two) times daily. [provider] Taking Active Self  levofloxacin (LEVAQUIN) 500 MG tablet [id number] Yes Take 1 tablet (500 mg total) by mouth daily for 6 days. Lavina Hamman, MD Taking Active  metroNIDAZOLE (FLAGYL) 500 MG tablet [id number] Yes Take 1 tablet (500 mg total) by mouth 2 (two) times daily for 7 days. Lavina Hamman, MD Taking Active   Multiple Vitamins-Minerals (CENTRUM SILVER ADULT 50+) TABS [id number] Yes Take by mouth. [provider] Taking Active Self  rosuvastatin (CRESTOR) 10 MG  tablet [id number]  Take 1 tablet (10 mg total) by mouth daily. [name] T, PA-C  Active   tamsulosin [facility]) 0.4 MG CAPS capsule [id number] Yes Take 2 capsules (0.8 mg total) by mouth daily after breakfast. Cathlyn Parsons, PA-C Taking Active Self  traZODone (DESYREL) 50 MG tablet [id number] Yes Take 50 mg by mouth at bedtime as needed. [provider] Taking Active Self  warfarin (COUMADIN) 5 MG tablet [id number] Yes Take 1 tablet (5 mg total) by mouth daily at 4 PM. Until seen by warfarin clinic. Lavina Hamman, MD Taking Active              SDOH:  (Social Determinants of Health) assessments and interventions performed:    Care Plan  Review of patient past medical history, allergies, medications, health status, including review of consultants reports, laboratory and other test data, was performed as part of comprehensive evaluation for care management services.   There are no care plans that you recently modified to display for this patient.    Plan: The patient has been provided with contact information for the care management team and has been advised to call with any health related questions or concerns.  The care management team will reach out to the patient again over the next 60+ business days. He was also referred to potential external vendor (pcp) via e-mail pending, possible warm transfer of services   Dustin Bumbaugh L. Lavina Hamman, RN, BSN, [place] Coordinator Office number [phone] Main [facility] number [phone] Fax number [phone]

## 2021-06-21 DIAGNOSIS — H938X3 Other specified disorders of ear, bilateral: Secondary | ICD-10-CM | POA: Diagnosis not present

## 2021-06-21 DIAGNOSIS — H9190 Unspecified hearing loss, unspecified ear: Secondary | ICD-10-CM | POA: Diagnosis not present

## 2021-06-21 DIAGNOSIS — D696 Thrombocytopenia, unspecified: Secondary | ICD-10-CM | POA: Diagnosis not present

## 2021-06-21 DIAGNOSIS — D649 Anemia, unspecified: Secondary | ICD-10-CM | POA: Diagnosis not present

## 2021-06-28 DIAGNOSIS — N210 Calculus in bladder: Secondary | ICD-10-CM | POA: Diagnosis not present

## 2021-06-28 DIAGNOSIS — R3914 Feeling of incomplete bladder emptying: Secondary | ICD-10-CM | POA: Diagnosis not present

## 2021-07-12 ENCOUNTER — Ambulatory Visit: Payer: Medicare Other | Admitting: *Deleted

## 2021-07-12 DIAGNOSIS — Z5181 Encounter for therapeutic drug level monitoring: Secondary | ICD-10-CM

## 2021-07-12 DIAGNOSIS — I63411 Cerebral infarction due to embolism of right middle cerebral artery: Secondary | ICD-10-CM | POA: Diagnosis not present

## 2021-07-12 DIAGNOSIS — Z7901 Long term (current) use of anticoagulants: Secondary | ICD-10-CM | POA: Diagnosis not present

## 2021-07-12 DIAGNOSIS — I4811 Longstanding persistent atrial fibrillation: Secondary | ICD-10-CM | POA: Diagnosis not present

## 2021-07-12 DIAGNOSIS — Z9889 Other specified postprocedural states: Secondary | ICD-10-CM

## 2021-07-12 LAB — POCT INR: INR: 2 (ref 2.0–3.0)

## 2021-07-12 NOTE — Patient Instructions (Signed)
Description   Continue taking warfarin 1 tablet daily except for 1.5 tablets on Mondays. Recheck INR in 5 weeks. [facility] [phone].

## 2021-07-13 DIAGNOSIS — I10 Essential (primary) hypertension: Secondary | ICD-10-CM | POA: Diagnosis not present

## 2021-07-13 DIAGNOSIS — I4891 Unspecified atrial fibrillation: Secondary | ICD-10-CM | POA: Diagnosis not present

## 2021-07-13 DIAGNOSIS — R7303 Prediabetes: Secondary | ICD-10-CM | POA: Diagnosis not present

## 2021-07-18 ENCOUNTER — Encounter: Payer: Self-pay | Admitting: Adult Health

## 2021-07-18 ENCOUNTER — Ambulatory Visit: Payer: Medicare Other | Admitting: Adult Health

## 2021-07-18 VITALS — BP 134/78 | HR 76

## 2021-07-18 DIAGNOSIS — G609 Hereditary and idiopathic neuropathy, unspecified: Secondary | ICD-10-CM | POA: Diagnosis not present

## 2021-07-18 DIAGNOSIS — Z09 Encounter for follow-up examination after completed treatment for conditions other than malignant neoplasm: Secondary | ICD-10-CM

## 2021-07-18 DIAGNOSIS — I639 Cerebral infarction, unspecified: Secondary | ICD-10-CM

## 2021-07-18 DIAGNOSIS — Z8673 Personal history of transient ischemic attack (TIA), and cerebral infarction without residual deficits: Secondary | ICD-10-CM

## 2021-07-18 MED ORDER — GABAPENTIN 300 MG PO CAPS: 300.0000 mg | ORAL_CAPSULE | Freq: Two times a day (BID) | ORAL | 5 refills | Status: DC

## 2021-07-18 NOTE — Patient Instructions (Addendum)
Increase gabapentin dosage - recommend increase dose at '300mg'$  twice daily ('600mg'$  daily total) - you can start taking '300mg'$  at night tonight; in regards to morning dose, you can start with taking 1 '100mg'$  capsule in the morning and gradually increase to '300mg'$  as this medication can cause fatigue during the day. If you start to experience fatigue during the day, you can stop morning dose and if you do not have any benefit on '300mg'$  nightly, would recommend you take both capsules at night for '600mg'$  nightly - please let me know if you have any questions regarding these recommendations  Consider participation in physical therapy with a neuro PT - please call if interested in pursing in the future  Continue warfarin and atorvastatin for secondary stroke prevention   Continue to follow with your PCP for aggressive stroke risk factor management      Follow up in 4 months or call earlier if needed    Peripheral Neuropathy Peripheral neuropathy is a type of nerve damage. It affects nerves that carry signals between the spinal cord and the arms, legs, and the rest of the body (peripheral nerves). It does not affect nerves in the spinal cord or brain. In peripheral neuropathy, one nerve or a group of nerves may be damaged. Peripheral neuropathy is a broad category that includes many specific nerve disorders, like diabetic neuropathy, hereditary neuropathy, and carpal tunnel syndrome. What are the causes? This condition may be caused by: Certain diseases, such as: Diabetes. This is the most common cause of peripheral neuropathy. Autoimmune diseases, such as rheumatoid arthritis and systemic lupus erythematosus. Nerve diseases that are passed from parent to child (inherited). Kidney disease. Thyroid disease. Other causes may include: Nerve injury. Pressure or stress on a nerve that lasts a long time. Lack (deficiency) of B vitamins. This can result from alcoholism, poor diet, or a restricted  diet. Infections. Some medicines, such as cancer medicines (chemotherapy). Poisonous (toxic) substances, such as lead and mercury. Too little blood flowing to the legs. In some cases, the cause of this condition is not known. What are the signs or symptoms? Symptoms of this condition depend on which of your nerves is damaged. Symptoms in the legs, hands, and arms can include: Loss of feeling (numbness) in the feet, hands, or both. Tingling in the feet, hands, or both. Burning pain. Very sensitive skin. Weakness. Not being able to move a part of the body (paralysis). Clumsiness or poor coordination. Muscle twitching. Loss of balance. Symptoms in other parts of the body can include: Not being able to control your bladder. Feeling dizzy. Sexual problems. How is this diagnosed? Diagnosing and finding the cause of peripheral neuropathy can be difficult. Your health care provider will take your medical history and do a physical exam. A neurological exam will also be done. This involves checking things that are affected by your brain, spinal cord, and nerves (nervous system). For example, your health care provider will check your reflexes, how you move, and what you can feel. You may have other tests, such as: Blood tests. Electromyogram (EMG) and nerve conduction tests. These tests check nerve function and how well the nerves are controlling the muscles. Imaging tests, such as a CT scan or MRI, to rule out other causes of your symptoms. Removing a small piece of nerve to be examined in a lab (nerve biopsy). Removing and examining a small amount of the fluid that surrounds the brain and spinal cord (lumbar puncture). How is this treated? Treatment for  this condition may involve: Treating the underlying cause of the neuropathy, such as diabetes, kidney disease, or vitamin deficiencies. Stopping medicines that can cause neuropathy, such as chemotherapy. Medicine to help relieve pain.  Medicines may include: Prescription or over-the-counter pain medicine. Anti-seizure medicine. Antidepressants. Pain-relieving patches that are applied to painful areas of skin. Surgery to relieve pressure on a nerve or to destroy a nerve that is causing pain. Physical therapy to help improve movement and balance. Devices to help you move around (assistive devices). Follow these instructions at home: Medicines Take over-the-counter and prescription medicines only as told by your health care provider. Do not take any other medicines without first asking your health care provider. Ask your health care provider if the medicine prescribed to you requires you to avoid driving or using machinery. Lifestyle  Do not use any products that contain nicotine or tobacco. These products include cigarettes, chewing tobacco, and vaping devices, such as e-cigarettes. Smoking keeps blood from reaching damaged nerves. If you need help quitting, ask your health care provider. Avoid or limit alcohol. Too much alcohol can cause a vitamin B deficiency, and vitamin B is needed for healthy nerves. Eat a healthy diet. This includes: Eating foods that are high in fiber, such as beans, whole grains, and fresh fruits and vegetables. Limiting foods that are high in fat and processed sugars, such as fried or sweet foods. General instructions  If you have diabetes, work closely with your health care provider to keep your blood sugar under control. If you have numbness in your feet: Check every day for signs of injury or infection. Watch for redness, warmth, and swelling. Wear padded socks and comfortable shoes. These help protect your feet. Develop a good support system. Living with peripheral neuropathy can be stressful. Consider talking with a mental health specialist or joining a support group. Use assistive devices and attend physical therapy as told by your health care provider. This may include using a walker or a  cane. Keep all follow-up visits. This is important. Where to find more information [employer] of Neurological Disorders: [employer] Contact a health care provider if: You have new signs or symptoms of peripheral neuropathy. You are struggling emotionally from dealing with peripheral neuropathy. Your pain is not well controlled. Get help right away if: You have an injury or infection that is not healing normally. You develop new weakness in an arm or leg. You have fallen or do so frequently. Summary Peripheral neuropathy is when the nerves in the arms or legs are damaged, resulting in numbness, weakness, or pain. There are many causes of peripheral neuropathy, including diabetes, pinched nerves, vitamin deficiencies, autoimmune disease, and hereditary conditions. Diagnosing and finding the cause of peripheral neuropathy can be difficult. Your health care provider will take your medical history, do a physical exam, and do tests, including blood tests and nerve function tests. Treatment involves treating the underlying cause of the neuropathy and taking medicines to help control pain. Physical therapy and assistive devices may also help. This information is not intended to replace advice given to you by your health care provider. Make sure you discuss any questions you have with your health care provider. Document Revised: 08/30/2020 Document Reviewed: 08/30/2020 Elsevier Patient Education  [place].

## 2021-07-18 NOTE — Progress Notes (Signed)
[facility] [address] [place]. [place] [address] [phone]       HOSPITAL FOLLOW UP NOTE  [name]. Date of Birth:  1935/06/17 Medical Record Number:  [id number]   Reason for Referral:  hospital stroke follow up    SUBJECTIVE:   CHIEF COMPLAINT:  Chief Complaint  Patient presents with   Follow-up    Pt reports doing okay. He states he is concerned about his balance and says it is not perfect. Room 2 with wife    HPI:   [name]. is a 86 y.o. 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  Etiology found to be sepsis suspected source being tooth abscess.  MRI brain showed small left frontal DWI abnormality cortically with questionable ADC correlate.  CTA head/neck negative LVO, diffuse stenosis involving mid right P2 segment.  EF 60 to 65%, LVH and severely dilated left atrium.  Evaluated by neurologist Dr. Cheral Marker for guidance for anticoagulation. Per consult note, the size and location of acute infarct would not explain presenting symptoms and does not preclude him from restarting anticoagulation for A-fib as he is at low risk for hemorrhagic conversion.  Warfarin restarted at 5 mg daily.  LDL 77 -recommend continuation of Crestor 10 mg daily.  A1c 5.7, recommend close follow-up with PCP for monitoring. Treated for SIRS and otitis externa by primary team and tooth pain with concern of abscess treated and advised dentist follow-up.  He was discharged home on 5/17 stable condition with recommended [facility] PT/OT.   Today, 07/18/2021, [name] is being seen for initial hospital follow-up accompanied by his wife.  He was previously seen in office 04/2017 and lost to follow-up after that time.  Overall he has been doing  well since discharge.  He has concerns regarding his balance is not quite at baseline. Does still have some issues with left ear, has evaluation with ENT in a couple of months. Was seen by home health therapy only once and was cleared after that. He does mention longstanding history of neuropathy (+10 years) currently being treated with gabapentin 200 mg nightly, tolerates well but limited benefit. Symptoms typically worse at night. Neuropathy has been relatively stable over the past 2 years without progression.  Ambulates without assistive device.  Denies any stroke/TIA symptoms since discharge.  Compliant on warfarin and Crestor, denies side effects.  Blood pressure today 130/78.  Routinely followed by cardiology and Coumadin clinic for warfarin management and has since had follow-up with PCP.   No further concerns at this time     PERTINENT IMAGING  CT-head:  Chronic right temporal lobe atrophy secondary to RMCA infarction. Moderate atrophy throughout   CTA head and neck  1. Negative CTA for large vessel occlusion or other emergent finding. 2. Moderate diffuse stenosis involving the mid right P2 segment. 3. Additional mild for age atheromatous change elsewhere about the major arterial vascular of the head and neck. No other proximal high-grade or correctable stenosis. 4. Diffuse arterial vascular tortuosity, suggesting chronic underlying hypertension   MRI brain:  Acute, tiny, cortical left frontal lobe infarction; chronic right temporal lobe infarction with surrounding encephalomalacia/cortical atrophy, which is present throughout parenchyma as well. Chronic left occipital infarction; left cerebellar and cebrum bilaterally with microhemorrhages  Recent TTE: 60-65 % EF, LVH, severely dilated left atrial       ROS:   14 system review of systems performed and negative with exception of those listed in HPI  PMH:  Past Medical History:  Diagnosis Date   Acquired dilation of  ascending aorta and aortic root (HCC)    49m aortic root and 425mascending aorta on echo 03/2020   Bladder stones    Borderline diabetes    BPH (benign prostatic hyperplasia)    CAD (coronary artery disease) 12/19/2020   Non-obstructive coronary artery disease at cath in 2019   Coronary artery disease    cardiologist-  dr skMarlou PorchloCecille Rubinerhart NP--- per cath 06-02-2010 non-obstructive cad pLAD 30-40%   Diverticulosis of colon    Dysrhythmia    afib   Gout    Heart murmur    History of adenomatous polyp of colon    2002-- tubular adenoma   History of aortic insufficiency    severe -- s/p  AVR 08-03-2010   History of small bowel obstruction    02/ 2007 mechanical sbo s/p  surgical intervention;  partial sbo 09/ 2011 and 03-20-2011 resolved without surgical intervention   History of urinary retention    HTN (hypertension)    Peripheral neuropathy    Persistent atrial fibrillation (HCC)    S/P aortic valve replacement with prosthetic valve 08/03/2010   tissue valve   S/P Maze operation for atrial fibrillation 01/30/2018   Complete bilateral atrial lesion set using bipolar radiofrequency and cryothermy with clipping of LA appendage   S/P MVR (mitral valve repair) 01/30/2018   Complex valvuloplasty including artificial Gore-tex neochord placement x4 and Carbo medics Annuloflex ring annuloplasty, size 28   S/P patent foramen ovale closure 08/03/2010   at same time AVR   S/P tricuspid valve repair 01/30/2018   Using an MC3 Annuloplasty ring, size 28   [facility]   Tricuspid regurgitation     PSH:  Past Surgical History:  Procedure Laterality Date   BIOPSY  09/07/2018   Procedure: BIOPSY;  Surgeon: BrOtis BraceMD;  [facility];  Service: Gastroenterology;;   CARDIAC CATHETERIZATION  06-02-2010  dr skMarlou Porch non-obstructive cad- pLAD 30-40%/  normal LVSF/  severe AI   CARDIOVASCULAR STRESS TEST  04/12/2016   Low risk nuclear perfusion study w/ no significant  reversible ischemia/  normal LV function and wall motion ,  stress ef 60%/  55m73mnferior and lateral scooped ST-segment depression w/ exercise (may be repolarization abnormality), exercise capacity was moderately reduced   CATARACT EXTRACTION W/ INTRAOCULAR LENS  IMPLANT, BILATERAL  02/2010   CLIPPING OF ATRIAL APPENDAGE N/A 01/30/2018   Procedure: CLIPPING OF LEFT ATRIAL APPENDAGE USING ATRICLIP PRO2 45MM;  Surgeon: OweRexene AlbertsD;  [facility];  Laterality: N/A;   COLONOSCOPY WITH PROPOFOL N/A 10/21/2018   Procedure: COLONOSCOPY WITH PROPOFOL;  Surgeon: BraOtis BraceD;  [facility];  Service: Gastroenterology;  Laterality: N/A;   CYSTOSCOPY WITH LITHOLAPAXY N/A 06/05/2016   Procedure: CYSTOSCOPY WITH LITHOLAPAXY and fulgarization of bladder neck;  Surgeon: WreIrine SealD;  [facility];  Laterality: N/A;   ESOPHAGOGASTRODUODENOSCOPY (EGD) WITH PROPOFOL N/A 09/07/2018   Procedure: ESOPHAGOGASTRODUODENOSCOPY (EGD) WITH PROPOFOL;  Surgeon: BraOtis BraceD;  [facility];  Service: Gastroenterology;  Laterality: N/A;   EXPLORATORY LAPARTOMY /  CHOLECYSTECTOMY  02/28/2005   for Small  bowel obstruction (mechnical)  IR ANGIO EXTRACRAN SEL COM CAROTID INNOMINATE UNI L MOD SED  10/24/2016   IR ANGIO VERTEBRAL SEL SUBCLAVIAN INNOMINATE BILAT MOD SED  10/24/2016   IR PERCUTANEOUS ART THROMBECTOMY/INFUSION INTRACRANIAL INC DIAG ANGIO  10/24/2016   IR RADIOLOGIST EVAL & MGMT  12/05/2016   LEFT KNEE ARTHROSCOPY  2006   MAZE N/A 01/30/2018   Procedure: MAZE;  Surgeon: Rexene Alberts, MD;  Location: [place];  Service: Open Heart Surgery;  Laterality: N/A;   MITRAL VALVE REPAIR N/A 01/30/2018   Procedure: MITRAL VALVE REPAIR (MVR) USING CARBOMEDICS ANNULOFLEX SIZE 28;  Surgeon: Rexene Alberts, MD;  Location: [place];  Service: Open Heart Surgery;  Laterality: N/A;   POLYPECTOMY  10/21/2018    Procedure: POLYPECTOMY;  Surgeon: Otis Brace, MD;  [facility];  Service: Gastroenterology;;   RADIOLOGY WITH ANESTHESIA N/A 10/24/2016   Procedure: RADIOLOGY WITH ANESTHESIA;  Surgeon: Luanne Bras, MD;  Location: [place];  Service: Radiology;  Laterality: N/A;   RIGHT FOOT SURGERY     RIGHT MINIATURE ANTERIOR THORACOTOMY FOR AORTIC VALVE REPLACEMENT AND CLOSURE PATENT FORAMEN OVALE  08-03-2010  DR Levada Schilling Magna-ease pericardial tissue valve (20m)   RIGHT/LEFT HEART CATH AND CORONARY ANGIOGRAPHY N/A 10/14/2017   Procedure: RIGHT/LEFT HEART CATH AND CORONARY ANGIOGRAPHY;  Surgeon: CSherren Mocha MD;  [facility];  Service: Cardiovascular;  Laterality: N/A;   TEE WITHOUT CARDIOVERSION N/A 10/14/2017   Procedure: TRANSESOPHAGEAL ECHOCARDIOGRAM (TEE);  Surgeon: NJosue Hector MD;  [facility];  Service: Cardiovascular;  Laterality: N/A;   TOTAL HIP ARTHROPLASTY Left 08/02/2020   Procedure: TOTAL HIP ARTHROPLASTY ANTERIOR APPROACH;  Surgeon: OParalee Cancel MD;  [facility];  Service: Orthopedics;  Laterality: Left;   TRANSTHORACIC ECHOCARDIOGRAM  05/30/2016  dr skains   moderate  LVH ef 60-65%/  bioprothesis aortic valve present ,normal grandient and no AI /  mild MV calcification , moderate MR /  mild PR/ moderate TR/  PASP 382mg/ (RA denisty was identified 04-27-2016 echo) and is seen again today, this is likely a promient eustacian ridge, atrium is normal size   TRICUSPID VALVE REPLACEMENT N/A 01/30/2018   Procedure: TRICUSPID VALVE REPAIR USING MC3 SIZE 28;  Surgeon: OwRexene AlbertsMD;  [facility]: [facility];  Laterality: N/A;    Social History:  Social History   Socioeconomic History   Marital status: Married    Spouse name: Roberta   Number of children: 2   Years of education: Not on file   Highest education level: Not on file  Occupational History   Occupation: RETIRED  Tobacco Use   Smoking  status: Never   Smokeless tobacco: Never  Vaping Use   Vaping Use: Never used  Substance and Sexual Activity   Alcohol use: Yes    Comment: ONE OR TWO PER MONTH   Drug use: No   Sexual activity: Not on file  Other Topics Concern   Not on file  Social History Narrative   Not on file   Social Determinants of Health   Financial Resource Strain: Low Risk  (06/16/2021)   Overall Financial Resource Strain (CARDIA)    Difficulty of Paying Living Expenses: Not hard at all  Food Insecurity: No Food Insecurity (06/02/2021)   Hunger Vital Sign    Worried About Running Out of Food in the Last Year: Never true    Ran Out of Food in the Last Year: Never true  Transportation Needs: No  Transportation Needs (06/16/2021)   PRAPARE - Hydrologist (Medical): No    Lack of Transportation (Non-Medical): No  Physical Activity: Not on file  Stress: No Stress Concern Present (06/16/2021)   [place]    Feeling of Stress : Only a little  Social Connections: Unknown (06/02/2021)   Social Connection and Isolation Panel [NHANES]    Frequency of Communication with Friends and Family: Twice a week    Frequency of Social Gatherings with Friends and Family: Twice a week    Attends Religious Services: Patient refused    Marine scientist or Organizations: Patient refused    Attends Archivist Meetings: Patient refused    Marital Status: Married  Human resources officer Violence: Not At Risk (06/02/2021)   Humiliation, Afraid, Rape, and Kick questionnaire    Fear of Current or Ex-Partner: No    Emotionally Abused: No    Physically Abused: No    Sexually Abused: No    Family History:  Family History  Problem Relation Age of Onset   Heart disease Mother    Brain cancer Father     Medications:   Current Outpatient Medications on File Prior to Visit  Medication Sig Dispense Refill   allopurinol (ZYLOPRIM)  300 MG tablet Take 150 mg by mouth every morning.     amLODipine (NORVASC) 10 MG tablet TAKE 1 TABLET BY MOUTH  DAILY 90 tablet 2   amoxicillin (AMOXIL) 500 MG capsule Take 2,000 mg by mouth See admin instructions. Take 4 capsules (2000 mg) by mouth 1 hour prior to dental appointment     atenolol (TENORMIN) 25 MG tablet Take 0.5 tablets (12.5 mg total) by mouth daily.     Cinnamon 500 MG capsule Take 500 mg by mouth every morning.     feeding supplement (ENSURE ENLIVE / ENSURE PLUS) LIQD Take 237 mLs by mouth 2 (two) times daily between meals. 237 mL 12   ferrous sulfate 325 (65 FE) MG EC tablet Take 325 mg by mouth 2 (two) times daily.     finasteride (PROSCAR) 5 MG tablet Take 1 tablet (5 mg total) by mouth daily. 30 tablet 0   Glucos-Chondroit-Collag-Hyal (GLUCOSAMINE CHONDROIT-COLLAGEN PO) Take 1 tablet by mouth 2 (two) times daily.     Multiple Vitamins-Minerals (CENTRUM SILVER ADULT 50+) TABS Take by mouth.     rosuvastatin (CRESTOR) 10 MG tablet Take 1 tablet (10 mg total) by mouth daily. 90 tablet 3   tamsulosin (FLOMAX) 0.4 MG CAPS capsule Take 2 capsules (0.8 mg total) by mouth daily after breakfast. 30 capsule 0   traZODone (DESYREL) 50 MG tablet Take 50 mg by mouth at bedtime as needed.     warfarin (COUMADIN) 5 MG tablet Take 1 tablet (5 mg total) by mouth daily at 4 PM. Until seen by warfarin clinic. 135 tablet 3   ciprofloxacin-dexamethasone (CIPRODEX) OTIC suspension Place 4 drops into the right ear 2 (two) times daily. (Patient not taking: Reported on 07/18/2021) 7.5 mL 0   No current facility-administered medications on file prior to visit.    Allergies:   Allergies  Allergen Reactions   Sulfa Antibiotics Other (See Comments)    Granulocytosis   Lisinopril     Other reaction(s): cough      OBJECTIVE:  Physical Exam  Vitals:   07/18/21 0926  BP: 134/78  Pulse: 76   There is no height or weight on file to calculate  BMI. No results found.  Poststroke PHQ  2/9    06/16/2021   12:41 PM  Depression screen PHQ 2/9  Decreased Interest 0  Down, Depressed, Hopeless 0  PHQ - 2 Score 0     General: well developed, well nourished, very pleasant elderly Caucasian male, seated, in no evident distress Head: head normocephalic and atraumatic.   Neck: supple with no carotid or supraclavicular bruits Cardiovascular: regular rate and rhythm, no murmurs Musculoskeletal: no deformity Skin:  no rash/petichiae Vascular:  Normal pulses all extremities   Neurologic Exam Mental Status: Awake and fully alert.  Fluent speech and language.  Oriented to place and time. Recent and remote memory intact. Attention span, concentration and fund of knowledge appropriate. Mood and affect appropriate.  Cranial Nerves: Fundoscopic exam reveals sharp disc margins. Pupils equal, briskly reactive to light. Extraocular movements full without nystagmus. Visual fields full to confrontation. Hearing intact. Facial sensation intact. Face, tongue, palate moves normally and symmetrically.  Motor: Normal bulk and tone. Normal strength in all tested extremity muscles Sensory.:  Decreased light touch, vibratory and pinprick sensation bilateral lower extremities from ankle down Coordination: Rapid alternating movements normal in all extremities. Finger-to-nose and heel-to-shin performed accurately bilaterally. Gait and Station: Arises from chair without difficulty. Stance is normal. Gait demonstrates initially mild imbalance/unsteadiness but improved after a few steps. No AD.  Difficulty performing tandem walk and heel toe unsupported.   Reflexes: 1+ and symmetric. Toes downgoing.     NIHSS  0 Modified Rankin  0      ASSESSMENT: Charistopher Rumble. is a 86 y.o. year old male with likely incidental finding of small left frontal lobe infarct on 05/21/2021 after presenting with generalized weakness likely in setting of sepsis with otitis externa and dental abscess.  Vascular risk  factors include R MCA stroke, HTN, HLD, A-fib s/p ablation on warfarin, CAD, s/p aortic valve repair 07/2019 2/2 aortic insufficiency, CHF, mitral valve regurgitation and prediabetes.      PLAN:  Left frontal lobe infarct:  Continue warfarin daily  and Crestor 10 mg daily for secondary stroke prevention.   Discussed secondary stroke prevention measures and importance of close PCP follow up for aggressive stroke risk factor management including BP goal<130/90, HLD with LDL goal<70 and pre-DM with A1c.<7.  Stroke labs 05/2021: A1c 5.7, LDL 77 I have gone over the pathophysiology of stroke, warning signs and symptoms, risk factors and their management in some detail with instructions to go to the closest emergency room for symptoms of concern. Peripheral neuropathy:  >10 yr duration, stable over the past 2 years without progression.  Does have pre-DM possibly contributing.  Prior B12 level and thyroid function WNL.   Recommend increasing gabapentin dosage to 300 mg twice daily.  Did discuss potential side effects and possible need of dosage increase in the future.  Discussed participation with normal PT as imbalance likely coming from neuropathy.  He wishes to hold off at this time but will call if interested in pursuing in the future Will hold off on further neuropathy work-up as symptoms have been stable but if symptoms should worsen down the road, may benefit from completing EMG/NCV. He verbalized understanding and agrees with plan    Follow up in 4 months or call earlier if needed   CC:  [place] provider: Dr. Leonie Man PCP: Marda Stalker, PA-C    I spent 59 minutes of face-to-face and non-face-to-face time with patient and wife.  This included previsit chart review including review of  recent hospitalization, lab review, study review, order entry, electronic health record documentation, patient and wife education regarding recent stroke including etiology, secondary stroke prevention measures  and importance of managing stroke risk factors, chronic neuropathy, further treatment options approximated further evaluation in the future and answered all other questions to patient and wife's satisfaction   Frann Rider, AGNP-BC  [facility] [address] [place],  [address]  Phone [phone] Fax [phone] Note: This document was prepared with digital dictation and possible smart phrase technology. Any transcriptional errors that result from this process are unintentional.

## 2021-07-27 DIAGNOSIS — M25561 Pain in right knee: Secondary | ICD-10-CM | POA: Diagnosis not present

## 2021-07-27 DIAGNOSIS — M1712 Unilateral primary osteoarthritis, left knee: Secondary | ICD-10-CM | POA: Diagnosis not present

## 2021-08-01 ENCOUNTER — Telehealth: Payer: Self-pay | Admitting: *Deleted

## 2021-08-01 ENCOUNTER — Other Ambulatory Visit: Payer: Self-pay | Admitting: *Deleted

## 2021-08-01 NOTE — Patient Outreach (Signed)
  Care Coordination   Unsuccessful follow up  Visit Note   08/01/2021 Name: [name]. MRN: [id number] DOB: 1935-03-05  [name] is a 86 y.o. year old male who sees Marda Stalker, [place] for primary care.   [facility] Unsuccessful outreach Outreach attempt to the listed at the preferred outreach number in EPIC [phone] No answer. [facility] RN CM left HIPAA [facility] Portability and Accountability Act) compliant voicemail message along with CM's contact info.  Left information in message to indicate the re assignment of [facility] RN CM  Encouraged a return call to RN CM for more information as needed     Follow up plan: No further intervention required. Pt to be possibly re assigned as needed to [place] pod RN CM  Case closure Letters for this RN CM sent to pt & MD  Encounter Outcome:  No Answer   Joelene Millin L. Lavina Hamman, RN, BSN, [place] Coordinator Office number [phone] Main [facility] number [phone] Fax number [phone]

## 2021-08-01 NOTE — Telephone Encounter (Signed)
   Pre-operative Risk Assessment    Patient Name: [name].  DOB: [birth year] MRN: [id number]      Request for Surgical Clearance    Procedure:   LEFT TOTAL KNEE ARTHROPLASTY  Date of Surgery:  Clearance 09/19/21                                 Surgeon:  DR. MATTHEW OLIN Surgeon's Group or Practice Name:  [facility] Phone number:  [phone] Nelida Meuse  Fax number:  [phone]   Type of Clearance Requested:   - Medical  - Pharmacy:  Hold Warfarin (Coumadin)     Type of Anesthesia:  Spinal   Additional requests/questions:    Jiles Prows   08/01/2021, 6:17 PM

## 2021-08-02 NOTE — Telephone Encounter (Signed)
Clinical pharmacist to review Coumadin.  Patient has a history of permanent atrial fibrillation, underwent left atrial appendage clipping and maze procedure at the time all mitral valve repair and tricuspid valve repair in January 2020.  Also has a remote history of bioprosthetic AVR and the patent foramen ovale closure in 2012.  Nonobstructive CAD by cath in 2019.

## 2021-08-03 ENCOUNTER — Telehealth: Payer: Self-pay | Admitting: Adult Health

## 2021-08-03 DIAGNOSIS — N200 Calculus of kidney: Secondary | ICD-10-CM | POA: Diagnosis not present

## 2021-08-03 DIAGNOSIS — N139 Obstructive and reflux uropathy, unspecified: Secondary | ICD-10-CM | POA: Diagnosis not present

## 2021-08-03 NOTE — Telephone Encounter (Signed)
Patient was seen back in May and found to have incidental frontal infarct presenting symptoms likely in setting of sepsis.  Typically recommend waiting 3 to 6 months post stroke for elective procedure as he carries a higher risk of recurrent strokes during the interval time, procedure currently scheduled in September which would be 4 months post stroke.  He is on warfarin for atrial fibrillation and will need cardiology clearance in this regards.  Will send to Dr. Leonie Man for further input.

## 2021-08-03 NOTE — Telephone Encounter (Signed)
Pt said PCP requesting surgical clearance for knee replacement surgery on September 12 at [facility].

## 2021-08-04 ENCOUNTER — Telehealth: Payer: Self-pay | Admitting: *Deleted

## 2021-08-04 NOTE — Telephone Encounter (Signed)
Pt has been scheduled for a tele visit, 08/10/21.

## 2021-08-04 NOTE — Telephone Encounter (Signed)
Pt has been schedule for a tele visit 08/10/21.  Consent on file / medications reconciled.    Patient Consent for Virtual Visit        Gerald Yi. has provided verbal consent on 08/04/2021 for a virtual visit (video or telephone).   CONSENT FOR VIRTUAL VISIT FOR:  Gerald Amas.  By participating in this virtual visit I agree to the following:  I hereby voluntarily request, consent and authorize [facility] and its employed or contracted physicians, physician assistants, nurse practitioners or other licensed health care professionals (the Practitioner), to provide me with telemedicine health care services (the "Services") as deemed necessary by the treating Practitioner. I acknowledge and consent to receive the Services by the Practitioner via telemedicine. I understand that the telemedicine visit will involve communicating with the Practitioner through live audiovisual communication technology and the disclosure of certain medical information by electronic transmission. I acknowledge that I have been given the opportunity to request an in-person assessment or other available alternative prior to the telemedicine visit and am voluntarily participating in the telemedicine visit.  I understand that I have the right to withhold or withdraw my consent to the use of telemedicine in the course of my care at any time, without affecting my right to future care or treatment, and that the Practitioner or I may terminate the telemedicine visit at any time. I understand that I have the right to inspect all information obtained and/or recorded in the course of the telemedicine visit and may receive copies of available information for a reasonable fee.  I understand that some of the potential risks of receiving the Services via telemedicine include:  Delay or interruption in medical evaluation due to technological equipment failure or disruption; Information transmitted may not be sufficient  (e.g. poor resolution of images) to allow for appropriate medical decision making by the Practitioner; and/or  In rare instances, security protocols could fail, causing a breach of personal health information.  Furthermore, I acknowledge that it is my responsibility to provide information about my medical history, conditions and care that is complete and accurate to the best of my ability. I acknowledge that Practitioner's advice, recommendations, and/or decision may be based on factors not within their control, such as incomplete or inaccurate data provided by me or distortions of diagnostic images or specimens that may result from electronic transmissions. I understand that the practice of medicine is not an exact science and that Practitioner makes no warranties or guarantees regarding treatment outcomes. I acknowledge that a copy of this consent can be made available to me via my patient portal ([name]), or I can request a printed copy by calling the office of [facility].    I understand that my insurance will be billed for this visit.   I have read or had this consent read to me. I understand the contents of this consent, which adequately explains the benefits and risks of the Services being provided via telemedicine.  I have been provided ample opportunity to ask questions regarding this consent and the Services and have had my questions answered to my satisfaction. I give my informed consent for the services to be provided through the use of telemedicine in my medical care

## 2021-08-04 NOTE — Telephone Encounter (Signed)
Patient with diagnosis of A fib on warfarin for anticoagulation.    Procedure: LEFT TOTAL KNEE ARTHROPLASTY Date of procedure: 09/19/21   CHA2DS2-VASc Score = 7  This indicates a 11.2% annual risk of stroke. The patient's score is based upon: CHF History: 1 HTN History: 1 Diabetes History: 0 Stroke History: 2 Vascular Disease History: 1 Age Score: 2 Gender Score: 0    CrCl 52 mL.min Platelet count 250K  Patient had CVA on 05/21/21. Neurology also giving input.  Patient will be very high risk off anticoagulation.  Per office protocol, patient can hold warfarin for 5  days prior to procedure.    Patient WILL need need bridging with Lovenox (enoxaparin) around procedure.  **This guidance is not considered finalized until pre-operative APP has relayed final recommendations.**

## 2021-08-04 NOTE — Telephone Encounter (Signed)
   Name: [name].  DOB: [birth year]  MRN: [id number]  Primary Cardiologist: Candee Furbish, MD   Preoperative team, please contact this patient and set up a phone call appointment for further preoperative risk assessment. Please obtain consent and complete medication review. Thank you for your help.  I confirm that guidance regarding antiplatelet and oral anticoagulation therapy has been completed and, if necessary, noted below.  Patient with diagnosis of A fib on warfarin for anticoagulation.     Procedure: LEFT TOTAL KNEE ARTHROPLASTY Date of procedure: 09/19/21     CHA2DS2-VASc Score = 7  This indicates a 11.2% annual risk of stroke. The patient's score is based upon: CHF History: 1 HTN History: 1 Diabetes History: 0 Stroke History: 2 Vascular Disease History: 1 Age Score: 2 Gender Score: 0     CrCl 52 mL.min Platelet count 250K   Patient had CVA on 05/21/21. Neurology also giving input.   Patient will be very high risk off anticoagulation.   Per office protocol, patient can hold warfarin for 5  days prior to procedure.     Patient WILL need need bridging with Lovenox (enoxaparin) around procedure.  Lenna Sciara, NP 08/04/2021, 12:07 PM [facility] [address] [place], [place] [address]

## 2021-08-07 NOTE — Telephone Encounter (Signed)
Called patient, advised him of Janett Billow and Dr Clydene Fake input. He stated [facility] told him they were faxing a clearance letter to his PCP and Cardiologist; they didn't mention neurology. Hs surgery is in early Sept.  I advised if we get a letter Janett Billow will fill it out. Patient verbalized understanding, appreciation.

## 2021-08-07 NOTE — Telephone Encounter (Signed)
Please advise patient of Dr. Clydene Fake recommendations. As you mentioned previously, we typically obtain a clearance form from surgical office to complete but have not yet obtained one. Thank you.

## 2021-08-10 ENCOUNTER — Ambulatory Visit (INDEPENDENT_AMBULATORY_CARE_PROVIDER_SITE_OTHER): Payer: Medicare Other | Admitting: Nurse Practitioner

## 2021-08-10 DIAGNOSIS — Z01810 Encounter for preprocedural cardiovascular examination: Secondary | ICD-10-CM

## 2021-08-10 NOTE — Progress Notes (Signed)
Virtual Visit via Telephone Note   Because of ELIJA MCCAMISH Jr.'s co-morbid illnesses, he is at least at moderate risk for complications without adequate follow up.  This format is felt to be most appropriate for this patient at this time.  The patient did not have access to video technology/had technical difficulties with video requiring transitioning to audio format only (telephone).  All issues noted in this document were discussed and addressed.  No physical exam could be performed with this format.  Please refer to the patient's chart for his consent to telehealth for [facility].  Evaluation Performed:  Preoperative cardiovascular risk assessment _____________   Date:  08/10/2021   Patient ID:  [name], DOB 08-25-1935, MRN [id number] Patient Location:  Home Provider location:   Office  Primary Care Provider:  Marda Stalker, PA-C Primary Cardiologist:  Candee Furbish, MD  Chief Complaint / Patient Profile   86 y.o. y/o male with a h/o aortic insufficiency s/p bioprosthetic AVR repair 2012, severe MR s/p MV repair 01/2018, HTN, permanent AF (on warfarin), right MCA CVA, nonobstructive CAD, dilated thoracic aorta (41 mm BPH who is pending total knee arthroplasty on 09/19/2021 and presents today for telephonic preoperative cardiovascular risk assessment.  Past Medical History    Past Medical History:  Diagnosis Date   Acquired dilation of ascending aorta and aortic root ([place])    26m aortic root and 463mascending aorta on echo 03/2020   Bladder stones    Borderline diabetes    BPH (benign prostatic hyperplasia)    CAD (coronary artery disease) 12/19/2020   Non-obstructive coronary artery disease at cath in 2019   Coronary artery disease    cardiologist-  dr skMarlou PorchloCecille Rubinerhart NP--- per cath 06-02-2010 non-obstructive cad pLAD 30-40%   Diverticulosis of colon    Dysrhythmia    afib   Gout    Heart murmur    History of adenomatous polyp of colon     2002-- tubular adenoma   History of aortic insufficiency    severe -- s/p  AVR 08-03-2010   History of small bowel obstruction    02/ 2007 mechanical sbo s/p  surgical intervention;  partial sbo 09/ 2011 and 03-20-2011 resolved without surgical intervention   History of urinary retention    HTN (hypertension)    Peripheral neuropathy    Persistent atrial fibrillation (HCC)    S/P aortic valve replacement with prosthetic valve 08/03/2010   tissue valve   S/P Maze operation for atrial fibrillation 01/30/2018   Complete bilateral atrial lesion set using bipolar radiofrequency and cryothermy with clipping of LA appendage   S/P MVR (mitral valve repair) 01/30/2018   Complex valvuloplasty including artificial Gore-tex neochord placement x4 and Carbo medics Annuloflex ring annuloplasty, size 28   S/P patent foramen ovale closure 08/03/2010   at same time AVR   S/P tricuspid valve repair 01/30/2018   Using an MC3 Annuloplasty ring, size 28   [place]   Tricuspid regurgitation    Past Surgical History:  Procedure Laterality Date   BIOPSY  09/07/2018   Procedure: BIOPSY;  Surgeon: BrOtis BraceMD;  Location: [facility];  Service: Gastroenterology;;   CARDIAC CATHETERIZATION  06-02-2010  dr skMarlou Porch non-obstructive cad- pLAD 30-40%/  normal LVSF/  severe AI   CARDIOVASCULAR STRESS TEST  04/12/2016   Low risk nuclear perfusion study w/ no significant reversible ischemia/  normal LV function and wall motion ,  stress ef 60%/  108m inferior and lateral scooped ST-segment depression w/ exercise (may be repolarization abnormality), exercise capacity was moderately reduced   CATARACT EXTRACTION W/ INTRAOCULAR LENS  IMPLANT, BILATERAL  02/2010   CLIPPING OF ATRIAL APPENDAGE N/A 01/30/2018   Procedure: CLIPPING OF LEFT ATRIAL APPENDAGE USING ATRICLIP PRO2 45MM;  Surgeon: ORexene Alberts MD;  [facility]  Service: Open Heart Surgery;  Laterality: N/A;   COLONOSCOPY WITH PROPOFOL N/A  10/21/2018   Procedure: COLONOSCOPY WITH PROPOFOL;  Surgeon: BOtis Brace MD;  [facility];  Service: Gastroenterology;  Laterality: N/A;   CYSTOSCOPY WITH LITHOLAPAXY N/A 06/05/2016   Procedure: CYSTOSCOPY WITH LITHOLAPAXY and fulgarization of bladder neck;  Surgeon: WIrine Seal MD;  [facility]  Service: Urology;  Laterality: N/A;   ESOPHAGOGASTRODUODENOSCOPY (EGD) WITH PROPOFOL N/A 09/07/2018   Procedure: ESOPHAGOGASTRODUODENOSCOPY (EGD) WITH PROPOFOL;  Surgeon: BOtis Brace MD;  [facility];  Service: Gastroenterology;  Laterality: N/A;   EXPLORATORY LAPARTOMY /  CHOLECYSTECTOMY  02/28/2005   for Small  bowel obstruction (mechnical)   IR ANGIO EXTRACRAN SEL COM CAROTID INNOMINATE UNI L MOD SED  10/24/2016   IR ANGIO VERTEBRAL SEL SUBCLAVIAN INNOMINATE BILAT MOD SED  10/24/2016   IR PERCUTANEOUS ART THROMBECTOMY/INFUSION INTRACRANIAL INC DIAG ANGIO  10/24/2016   IR RADIOLOGIST EVAL & MGMT  12/05/2016   LEFT KNEE ARTHROSCOPY  2006   MAZE N/A 01/30/2018   Procedure: MAZE;  Surgeon: ORexene Alberts MD;  [place] [facility]  Service: Open Heart Surgery;  Laterality: N/A;   MITRAL VALVE REPAIR N/A 01/30/2018   Procedure: MITRAL VALVE REPAIR (MVR) USING CARBOMEDICS ANNULOFLEX SIZE 28;  Surgeon: ORexene Alberts MD;  [facility]  Service: Open Heart Surgery;  Laterality: N/A;   POLYPECTOMY  10/21/2018   Procedure: POLYPECTOMY;  Surgeon: BOtis Brace MD;  [facility];  Service: Gastroenterology;;   RADIOLOGY WITH ANESTHESIA N/A 10/24/2016   Procedure: RADIOLOGY WITH ANESTHESIA;  Surgeon: DLuanne Bras MD;  [place] [facility]  Service: Radiology;  Laterality: N/A;   RIGHT FOOT SURGERY     RIGHT MINIATURE ANTERIOR THORACOTOMY FOR AORTIC VALVE REPLACEMENT AND CLOSURE PATENT FORAMEN OVALE  08-03-2010  DR OLevada SchillingMagna-ease pericardial tissue valve (246m   RIGHT/LEFT HEART CATH AND CORONARY ANGIOGRAPHY N/A  10/14/2017   Procedure: RIGHT/LEFT HEART CATH AND CORONARY ANGIOGRAPHY;  Surgeon: CoSherren MochaMD;  [facility];  Service: Cardiovascular;  Laterality: N/A;   TEE WITHOUT CARDIOVERSION N/A 10/14/2017   Procedure: TRANSESOPHAGEAL ECHOCARDIOGRAM (TEE);  Surgeon: NiJosue HectorMD;  [facility];  Service: Cardiovascular;  Laterality: N/A;   TOTAL HIP ARTHROPLASTY Left 08/02/2020   Procedure: TOTAL HIP ARTHROPLASTY ANTERIOR APPROACH;  Surgeon: OlParalee CancelMD;  [facility];  Service: Orthopedics;  Laterality: Left;   TRANSTHORACIC ECHOCARDIOGRAM  05/30/2016  dr skains   moderate  LVH ef 60-65%/  bioprothesis aortic valve present ,normal grandient and no AI /  mild MV calcification , moderate MR /  mild PR/ moderate TR/  PASP 3569m/ (RA denisty was identified 04-27-2016 echo) and is seen again today, this is likely a promient eustacian ridge, atrium is normal size   TRICUSPID VALVE REPLACEMENT N/A 01/30/2018   Procedure: TRICUSPID VALVE REPAIR USING MC3 SIZE 28;  Surgeon: OweRexene AlbertsD;  [facility];  Laterality: N/A;    Allergies  Allergies  Allergen Reactions   Sulfa Antibiotics Other (See Comments)    Granulocytosis  Lisinopril     Other reaction(s): cough    History of Present Illness    Josip Merolla. is a 86 y.o. male who presents via audio/video conferencing for a telehealth visit today.  Pt was last seen in cardiology clinic on 05/29/2021 by [name], PA.  At that time [name] was doing well with no complaints.  Denied SOB, syncope, orthopnea, leg edema the patient is now pending procedure as outlined above. Since his last visit, he continues to do well with no cardiac related complaints currently.  He denied any issues with bleeding and we discussed the process for bridging of Lovenox and neck steps prior to his procedure.   Home Medications    Prior to Admission medications    Medication Sig Start Date End Date Taking? Authorizing Provider  allopurinol (ZYLOPRIM) 300 MG tablet Take 150 mg by mouth every morning.    [provider]  amLODipine (NORVASC) 10 MG tablet TAKE 1 TABLET BY MOUTH  DAILY 04/26/21   [name] T, PA-C  amoxicillin (AMOXIL) 500 MG capsule Take 2,000 mg by mouth See admin instructions. Take 4 capsules (2000 mg) by mouth 1 hour prior to dental appointment    [provider]  atenolol (TENORMIN) 25 MG tablet Take 0.5 tablets (12.5 mg total) by mouth daily. 05/24/21   Lavina Hamman, MD  Cinnamon 500 MG capsule Take 500 mg by mouth every morning.    [provider]  feeding supplement (ENSURE ENLIVE / ENSURE PLUS) LIQD Take 237 mLs by mouth 2 (two) times daily between meals. 05/24/21   Lavina Hamman, MD  ferrous sulfate 325 (65 FE) MG EC tablet Take 325 mg by mouth 2 (two) times daily. 10/29/19   [provider]  finasteride (PROSCAR) 5 MG tablet Take 1 tablet (5 mg total) by mouth daily. 11/05/16   Angiulli, Lavon Paganini, PA-C  gabapentin (NEURONTIN) 100 MG capsule Take 100 mg by mouth in the morning.    [provider]  gabapentin (NEURONTIN) 300 MG capsule Take 1 capsule (300 mg total) by mouth 2 (two) times daily. Patient taking differently: Take 300 mg by mouth at bedtime. 07/18/21   Frann Rider, NP  Glucos-Chondroit-Collag-Hyal (GLUCOSAMINE CHONDROIT-COLLAGEN PO) Take 1 tablet by mouth 2 (two) times daily.    [provider]  Multiple Vitamins-Minerals (CENTRUM SILVER ADULT 50+) TABS Take by mouth.    [provider]  rosuvastatin (CRESTOR) 10 MG tablet Take 1 tablet (10 mg total) by mouth daily. 05/29/21   [name] T, PA-C  tamsulosin (FLOMAX) 0.4 MG CAPS capsule Take 2 capsules (0.8 mg total) by mouth daily after breakfast. 11/05/16   Angiulli, Lavon Paganini, PA-C  traZODone (DESYREL) 50 MG tablet Take 50 mg by mouth at bedtime as needed.    [provider]  warfarin  (COUMADIN) 5 MG tablet Take 1 tablet (5 mg total) by mouth daily at 4 PM. Until seen by warfarin clinic. 05/24/21   Lavina Hamman, MD    Physical Exam    Vital Signs:  [name] does not have vital signs available for review today.  None available today  Given telephonic nature of communication, physical exam is limited. AAOx3. NAD. Normal affect.  Speech and respirations are unlabored.  Accessory Clinical Findings    None  Assessment & Plan    1.  Preoperative Cardiovascular Risk Assessment:  The patient affirms he has been doing well without any new cardiac symptoms.  They are able to achieve 4 METS without cardiac limitations. Therefore, based on ACC/AHA guidelines, the patient would be at acceptable risk for the planned procedure without further cardiovascular testing. The patient was advised that if he develops new symptoms prior to surgery to contact our office to arrange for a follow-up visit, and he verbalized understanding.  { Mr. Maddocks perioperative risk of a major cardiac event is 0.9% according to the Revised Cardiac Risk Index (RCRI).  Therefore, he is at low risk for perioperative complications.   His functional capacity is fair at 4.31 METs according to the Duke Activity Status Index (DASI). Recommendations: According to ACC/AHA guidelines, no further cardiovascular testing needed.  The patient may proceed to surgery at acceptable risk.   Antiplatelet and/or Anticoagulation Recommendations:  Coumadin can by held for 5 days prior to surgery.  Please resume post op when felt to be safe. Patient WILL need need bridging with Lovenox (enoxaparin) around procedure    A copy of this note will be routed to requesting surgeon.  Time:   Today, I have spent 7 minutes with the patient with telehealth technology discussing medical history, symptoms, and management plan.     Mable Fill, Marissa Nestle, NP  08/10/2021, 7:00 AM

## 2021-08-16 ENCOUNTER — Ambulatory Visit: Payer: Medicare Other

## 2021-08-16 DIAGNOSIS — I63411 Cerebral infarction due to embolism of right middle cerebral artery: Secondary | ICD-10-CM

## 2021-08-16 DIAGNOSIS — Z7901 Long term (current) use of anticoagulants: Secondary | ICD-10-CM

## 2021-08-16 DIAGNOSIS — I4811 Longstanding persistent atrial fibrillation: Secondary | ICD-10-CM

## 2021-08-16 DIAGNOSIS — Z9889 Other specified postprocedural states: Secondary | ICD-10-CM

## 2021-08-16 LAB — POCT INR: INR: 2.5 (ref 2.0–3.0)

## 2021-08-16 NOTE — Patient Instructions (Addendum)
Description   Continue taking warfarin 1 tablet daily except for 1.5 tablets on Mondays. Recheck INR in 4 weeks. Coumadin Clinic [phone].

## 2021-09-01 DIAGNOSIS — M1712 Unilateral primary osteoarthritis, left knee: Secondary | ICD-10-CM | POA: Diagnosis not present

## 2021-09-01 DIAGNOSIS — M25562 Pain in left knee: Secondary | ICD-10-CM | POA: Diagnosis not present

## 2021-09-01 NOTE — Telephone Encounter (Signed)
Our office received a duplicate request for pre op clearance. I d/w the pre op provider and the Pharm-d to confirm if the pt has been cleared yet.   Per Pharm-D: coumadin clinic is aware of it.  their checking his INR on 9/5 and have a note to go over lovenox.  I will update the requesting office the pt has appt with the Coumadin Clinic 09/12/21, which will d/w the pt about Lovenox at that time.   Once the pt has been cleared our office will be sure to fax notes and recommendations over to the requesting office .

## 2021-09-04 ENCOUNTER — Telehealth: Payer: Self-pay

## 2021-09-04 NOTE — Telephone Encounter (Signed)
Surgical Clearance from [facility] has received, signed, and faxed back along with last office note.

## 2021-09-05 NOTE — Telephone Encounter (Signed)
I will forward this to pre op pharm-d.  

## 2021-09-05 NOTE — Telephone Encounter (Signed)
Due to his history of stroke and afib, the request not to bridge "if possible due to reports of increased peri-operative blood loss in setting of bridging anti-coagulation " by Dr Alvan Dame will need to be addressed by Dr. Marlou Porch. I do see a hx of GI bleed in his chart. I am not sure if Dr. Alvan Dame is referring to the patients personal hx of peri-operative bleeding or just patients in general.

## 2021-09-12 ENCOUNTER — Ambulatory Visit: Payer: Medicare Other | Attending: Cardiology

## 2021-09-12 DIAGNOSIS — Z5181 Encounter for therapeutic drug level monitoring: Secondary | ICD-10-CM

## 2021-09-12 DIAGNOSIS — I4811 Longstanding persistent atrial fibrillation: Secondary | ICD-10-CM | POA: Diagnosis not present

## 2021-09-12 DIAGNOSIS — Z7901 Long term (current) use of anticoagulants: Secondary | ICD-10-CM | POA: Diagnosis not present

## 2021-09-12 DIAGNOSIS — Z9889 Other specified postprocedural states: Secondary | ICD-10-CM

## 2021-09-12 DIAGNOSIS — I63411 Cerebral infarction due to embolism of right middle cerebral artery: Secondary | ICD-10-CM | POA: Diagnosis not present

## 2021-09-12 LAB — POCT INR: INR: 2.5 (ref 2.0–3.0)

## 2021-09-12 MED ORDER — ENOXAPARIN SODIUM 80 MG/0.8ML IJ SOSY: 80.0000 mg | PREFILLED_SYRINGE | Freq: Two times a day (BID) | INTRAMUSCULAR | 1 refills | Status: DC

## 2021-09-12 NOTE — Progress Notes (Addendum)
COVID Vaccine Completed:  Date of COVID positive in last 90 days:  PCP -  Cardiologist -   Cardiac clearance by Ambrose Pancoast 08/10/21 in Epic  Chest x-ray - 05/21/21 Epic EKG - 05/21/21 Epic Stress Test -  ECHO - 03/22/21 Epic Cardiac Cath -  Pacemaker/ICD device last checked: Spinal Cord Stimulator:  Bowel Prep -   Sleep Study -  CPAP -   Fasting Blood Sugar - pre Checks Blood Sugar _____ times a day  Blood Thinner Instructions: Warfarin, hold 5 days. Lovenox bridge Aspirin Instructions: Last Dose:  Activity level:  Can go up a flight of stairs and perform activities of daily living without stopping and without symptoms of chest pain or shortness of breath.  Able to exercise without symptoms  Unable to go up a flight of stairs without symptoms of     Anesthesia review: HTN, CVA, CAD, CHF, dysphagia, aortic valve replacement, SIRS, a fib  Patient denies shortness of breath, fever, cough and chest pain at PAT appointment  Patient verbalized understanding of instructions that were given to them at the PAT appointment. Patient was also instructed that they will need to review over the PAT instructions again at home before surgery.

## 2021-09-12 NOTE — Patient Instructions (Signed)
Continue taking warfarin 1 tablet daily except for 1.5 tablets on Mondays. Recheck INR in 2 weeks. Coumadin Clinic [phone]. Knee Surgery 9/12;  Lovenox Bridged;  Holding Coumadin 5 days prior;    9/6: Last dose of warfarin.  9/7: No warfarin or enoxaparin (Lovenox).  9/8: Inject enoxaparin '80mg'$  in the fatty abdominal tissue at least 2 inches from the belly button twice a day about 12 hours apart, 8am and 8pm rotate sites. No warfarin.  9/9: Inject enoxaparin in the fatty tissue every 12 hours, 8am and 8pm. No warfarin.  9/10: Inject enoxaparin in the fatty tissue every 12 hours, 8am and 8pm. No warfarin.  9/11: Inject enoxaparin in the fatty tissue in the morning at 8 am (No PM dose). No warfarin.  9/12: Procedure Day - No enoxaparin - Resume warfarin in the evening or as directed by doctor (take an extra half tablet with usual dose for 2 days then resume normal dose).  9/13: Resume enoxaparin inject in the fatty tissue every 12 hours and take warfarin  9/14: Inject enoxaparin in the fatty tissue every 12 hours and take warfarin  9/15: Inject enoxaparin in the fatty tissue every 12 hours and take warfarin  9/16: Inject enoxaparin in the fatty tissue every 12 hours and take warfarin  9/17: Inject enoxaparin in the fatty tissue every 12 hours and take warfarin  9/18: warfarin appt to check INR.

## 2021-09-12 NOTE — Patient Instructions (Signed)
SURGICAL WAITING ROOM VISITATION Patients having surgery or a procedure may have no more than 2 support people in the waiting area - these visitors may rotate.   Children under the age of 59 must have an adult with them who is not the patient. If the patient needs to stay at the hospital during part of their recovery, the visitor guidelines for inpatient rooms apply. Pre-op nurse will coordinate an appropriate time for 1 support person to accompany patient in pre-op.  This support person may not rotate.    Please refer to the [facility] website for the visitor guidelines for Inpatients (after your surgery is over and you are in a regular room).     Your procedure is scheduled on: 09/19/21   Report to [facility] Main Entrance    Report to admitting at  10:25 AM   Call this number if you have problems the morning of surgery [phone]   Do not eat food :After Midnight.   After Midnight you may have the following liquids until 9:55 AM DAY OF SURGERY  Water Non-Citrus Juices (without pulp, NO RED) Carbonated Beverages Black Coffee (NO MILK/CREAM OR CREAMERS, sugar ok)  Clear Tea (NO MILK/CREAM OR CREAMERS, sugar ok) regular and decaf                             Plain Jell-O (NO RED)                                           Fruit ices (not with fruit pulp, NO RED)                                     Popsicles (NO RED)                                                               Sports drinks like Gatorade (NO RED)                  The day of surgery:  Drink ONE (1) Pre-Surgery Clear Ensure at 9:55 AM the morning of surgery. Drink in one sitting. Do not sip.  This drink was given to you during your hospital  pre-op appointment visit. Nothing else to drink after completing the  Pre-Surgery Clear Ensure.          If you have questions, please contact your surgeon's office.   FOLLOW BOWEL PREP AND ANY ADDITIONAL PRE OP INSTRUCTIONS YOU RECEIVED FROM YOUR SURGEON'S  OFFICE!!!     Oral Hygiene is also important to reduce your risk of infection.                                    Remember - BRUSH YOUR TEETH THE MORNING OF SURGERY WITH YOUR REGULAR TOOTHPASTE   Take these medicines the morning of surgery with A SIP OF WATER: Allopurinol, Amlodipine, Atenolol, Finasteride, Gabapentin, Rosuvastatin, Flomax  You may not have any metal on your body including jewelry, and body piercing             Do not wear lotions, powders, cologne, or deodorant              Men may shave face and neck.   Do not bring valuables to the hospital. [place].   Bring small overnight bag day of surgery.   DO NOT [place]. PHARMACY WILL DISPENSE MEDICATIONS LISTED ON YOUR MEDICATION LIST TO YOU DURING YOUR ADMISSION [place]!    Special Instructions: Bring a copy of your healthcare power of attorney and living will documents         the day of surgery if you haven't scanned them before.              Please read over the following fact sheets you were given: IF YOU HAVE QUESTIONS ABOUT YOUR PRE-OP INSTRUCTIONS PLEASE CALL [facility] - Preparing for Surgery Before surgery, you can play an important role.  Because skin is not sterile, your skin needs to be as free of germs as possible.  You can reduce the number of germs on your skin by washing with CHG (chlorahexidine gluconate) soap before surgery.  CHG is an antiseptic cleaner which kills germs and bonds with the skin to continue killing germs even after washing. Please DO NOT use if you have an allergy to CHG or antibacterial soaps.  If your skin becomes reddened/irritated stop using the CHG and inform your nurse when you arrive at Short Stay. Do not shave (including legs and underarms) for at least 48 hours prior to the first CHG shower.  You may shave your face/neck.  Please  follow these instructions carefully:  1.  Shower with CHG Soap the night before surgery and the  morning of surgery.  2.  If you choose to wash your hair, wash your hair first as usual with your normal  shampoo.  3.  After you shampoo, rinse your hair and body thoroughly to remove the shampoo.                             4.  Use CHG as you would any other liquid soap.  You can apply chg directly to the skin and wash.  Gently with a scrungie or clean washcloth.  5.  Apply the CHG Soap to your body ONLY FROM THE NECK DOWN.   Do   not use on face/ open                           Wound or open sores. Avoid contact with eyes, ears mouth and   genitals (private parts).                       Wash face,  Genitals (private parts) with your normal soap.             6.  Wash thoroughly, paying special attention to the area where your    surgery  will be performed.  7.  Thoroughly rinse your body with warm water from the neck down.  8.  DO NOT shower/wash with your normal soap after  using and rinsing off the CHG Soap.                9.  Pat yourself dry with a clean towel.            10.  Wear clean pajamas.            11.  Place clean sheets on your bed the night of your first shower and do not  sleep with pets. Day of Surgery : Do not apply any lotions/deodorants the morning of surgery.  Please wear clean clothes to the hospital/surgery center.  FAILURE TO FOLLOW THESE INSTRUCTIONS MAY RESULT IN THE CANCELLATION OF YOUR SURGERY  PATIENT SIGNATURE_________________________________  NURSE SIGNATURE__________________________________  ________________________________________________________________________   [name]  An incentive spirometer is a tool that can help keep your lungs clear and active. This tool measures how well you are filling your lungs with each breath. Taking long deep breaths may help reverse or decrease the chance of developing breathing (pulmonary) problems (especially  infection) following: A long period of time when you are unable to move or be active. BEFORE THE PROCEDURE  If the spirometer includes an indicator to show your best effort, your nurse or respiratory therapist will set it to a desired goal. If possible, sit up straight or lean slightly forward. Try not to slouch. Hold the incentive spirometer in an upright position. INSTRUCTIONS FOR USE  Sit on the edge of your bed if possible, or sit up as far as you can in bed or on a chair. Hold the incentive spirometer in an upright position. Breathe out normally. Place the mouthpiece in your mouth and seal your lips tightly around it. Breathe in slowly and as deeply as possible, raising the piston or the ball toward the top of the column. Hold your breath for 3-5 seconds or for as long as possible. Allow the piston or ball to fall to the bottom of the column. Remove the mouthpiece from your mouth and breathe out normally. Rest for a few seconds and repeat Steps 1 through 7 at least 10 times every 1-2 hours when you are awake. Take your time and take a few normal breaths between deep breaths. The spirometer may include an indicator to show your best effort. Use the indicator as a goal to work toward during each repetition. After each set of 10 deep breaths, practice coughing to be sure your lungs are clear. If you have an incision (the cut made at the time of surgery), support your incision when coughing by placing a pillow or rolled up towels firmly against it. Once you are able to get out of bed, walk around indoors and cough well. You may stop using the incentive spirometer when instructed by your caregiver.  RISKS AND COMPLICATIONS Take your time so you do not get dizzy or light-headed. If you are in pain, you may need to take or ask for pain medication before doing incentive spirometry. It is harder to take a deep breath if you are having pain. AFTER USE Rest and breathe slowly and easily. It can be  helpful to keep track of a log of your progress. Your caregiver can provide you with a simple table to help with this. If you are using the spirometer at home, follow these instructions: [place] IF:  You are having difficultly using the spirometer. You have trouble using the spirometer as often as instructed. Your pain medication is not giving enough relief while using the spirometer.  You develop fever of 100.5 F (38.1 C) or higher. SEEK IMMEDIATE MEDICAL CARE IF:  You cough up bloody sputum that had not been present before. You develop fever of 102 F (38.9 C) or greater. You develop worsening pain at or near the incision site. MAKE SURE YOU:  Understand these instructions. Will watch your condition. Will get help right away if you are not doing well or get worse. Document Released: 05/07/2006 Document Revised: 03/19/2011 Document Reviewed: 07/08/2006 ExitCare Patient Information 2014 ExitCare, [place].   ________________________________________________________________________   WHAT IS A BLOOD TRANSFUSION? Blood Transfusion Information  A transfusion is the replacement of blood or some of its parts. Blood is made up of multiple cells which provide different functions. Red blood cells carry oxygen and are used for blood loss replacement. White blood cells fight against infection. Platelets control bleeding. Plasma helps clot blood. Other blood products are available for specialized needs, such as hemophilia or other clotting disorders. BEFORE THE TRANSFUSION  Who gives blood for transfusions?  Healthy volunteers who are fully evaluated to make sure their blood is safe. This is blood bank blood. Transfusion therapy is the safest it has ever been in the practice of medicine. Before blood is taken from a donor, a complete history is taken to make sure that person has no history of diseases nor engages in risky social behavior (examples are intravenous drug use or sexual  activity with multiple partners). The donor's travel history is screened to minimize risk of transmitting infections, such as malaria. The donated blood is tested for signs of infectious diseases, such as HIV and hepatitis. The blood is then tested to be sure it is compatible with you in order to minimize the chance of a transfusion reaction. If you or a relative donates blood, this is often done in anticipation of surgery and is not appropriate for emergency situations. It takes many days to process the donated blood. RISKS AND COMPLICATIONS Although transfusion therapy is very safe and saves many lives, the main dangers of transfusion include:  Getting an infectious disease. Developing a transfusion reaction. This is an allergic reaction to something in the blood you were given. Every precaution is taken to prevent this. The decision to have a blood transfusion has been considered carefully by your caregiver before blood is given. Blood is not given unless the benefits outweigh the risks. AFTER THE TRANSFUSION Right after receiving a blood transfusion, you will usually feel much better and more energetic. This is especially true if your red blood cells have gotten low (anemic). The transfusion raises the level of the red blood cells which carry oxygen, and this usually causes an energy increase. The nurse administering the transfusion will monitor you carefully for complications. HOME CARE INSTRUCTIONS  No special instructions are needed after a transfusion. You may find your energy is better. Speak with your caregiver about any limitations on activity for underlying diseases you may have. SEEK MEDICAL CARE IF:  Your condition is not improving after your transfusion. You develop redness or irritation at the intravenous (IV) site. SEEK IMMEDIATE MEDICAL CARE IF:  Any of the following symptoms occur over the next 12 hours: Shaking chills. You have a temperature by mouth above 102 F (38.9 C), not  controlled by medicine. Chest, back, or muscle pain. People around you feel you are not acting correctly or are confused. Shortness of breath or difficulty breathing. Dizziness and fainting. You get a rash or develop hives. You have a decrease in urine output.  Your urine turns a dark color or changes to pink, red, or brown. Any of the following symptoms occur over the next 10 days: You have a temperature by mouth above 102 F (38.9 C), not controlled by medicine. Shortness of breath. Weakness after normal activity. The white part of the eye turns yellow (jaundice). You have a decrease in the amount of urine or are urinating less often. Your urine turns a dark color or changes to pink, red, or brown. Document Released: 12/23/1999 Document Revised: 03/19/2011 Document Reviewed: 08/11/2007 [facility] Patient Information 2014 [place], [place].  _______________________________________________________________________

## 2021-09-13 ENCOUNTER — Encounter (HOSPITAL_COMMUNITY)
Admission: RE | Admit: 2021-09-13 | Discharge: 2021-09-13 | Disposition: A | Discharge status: Home / Self Care | Payer: Medicare Other | Source: Ambulatory Visit | Attending: Orthopedic Surgery | Admitting: Orthopedic Surgery

## 2021-09-13 ENCOUNTER — Encounter (HOSPITAL_COMMUNITY): Payer: Self-pay

## 2021-09-13 VITALS — BP 151/81 | HR 75 | Temp 97.8°F | Resp 12 | Ht 72.0 in | Wt 180.0 lb

## 2021-09-13 DIAGNOSIS — I4819 Other persistent atrial fibrillation: Secondary | ICD-10-CM | POA: Diagnosis not present

## 2021-09-13 DIAGNOSIS — I10 Essential (primary) hypertension: Secondary | ICD-10-CM | POA: Insufficient documentation

## 2021-09-13 DIAGNOSIS — Z01812 Encounter for preprocedural laboratory examination: Secondary | ICD-10-CM | POA: Diagnosis not present

## 2021-09-13 DIAGNOSIS — M1712 Unilateral primary osteoarthritis, left knee: Secondary | ICD-10-CM | POA: Diagnosis not present

## 2021-09-13 DIAGNOSIS — I2510 Atherosclerotic heart disease of native coronary artery without angina pectoris: Secondary | ICD-10-CM | POA: Insufficient documentation

## 2021-09-13 DIAGNOSIS — Z7901 Long term (current) use of anticoagulants: Secondary | ICD-10-CM | POA: Diagnosis not present

## 2021-09-13 DIAGNOSIS — Z01818 Encounter for other preprocedural examination: Secondary | ICD-10-CM

## 2021-09-13 DIAGNOSIS — Z953 Presence of xenogenic heart valve: Secondary | ICD-10-CM | POA: Insufficient documentation

## 2021-09-13 HISTORY — DX: Unspecified osteoarthritis, unspecified site: M19.90

## 2021-09-13 LAB — CBC
HCT: 37.8 % — ABNORMAL LOW (ref 39.0–52.0)
Hemoglobin: 12.4 g/dL — ABNORMAL LOW (ref 13.0–17.0)
MCH: 32.2 pg (ref 26.0–34.0)
MCHC: 32.8 g/dL (ref 30.0–36.0)
MCV: 98.2 fL (ref 80.0–100.0)
Platelets: 133 10*3/uL — ABNORMAL LOW (ref 150–400)
RBC: 3.85 MIL/uL — ABNORMAL LOW (ref 4.22–5.81)
RDW: 13.8 % (ref 11.5–15.5)
WBC: 6.2 10*3/uL (ref 4.0–10.5)
nRBC: 0 % (ref 0.0–0.2)

## 2021-09-13 LAB — BASIC METABOLIC PANEL
Anion gap: 7 (ref 5–15)
BUN: 22 mg/dL (ref 8–23)
CO2: 25 mmol/L (ref 22–32)
Calcium: 9.5 mg/dL (ref 8.9–10.3)
Chloride: 112 mmol/L — ABNORMAL HIGH (ref 98–111)
Creatinine, Ser: 1.03 mg/dL (ref 0.61–1.24)
GFR, Estimated: >60 mL/min (ref >60)
Glucose, Bld: 165 mg/dL — ABNORMAL HIGH (ref 70–99)
Potassium: 4.1 mmol/L (ref 3.5–5.1)
Sodium: 144 mmol/L (ref 135–145)

## 2021-09-13 LAB — SURGICAL PCR SCREEN
MRSA, PCR: NEGATIVE
Staphylococcus aureus: NEGATIVE

## 2021-09-14 NOTE — Progress Notes (Addendum)
Anesthesia Chart Review   Case: [id number] Date/Time: 09/19/21 1240   Procedure: TOTAL KNEE ARTHROPLASTY (Left: Knee)   Anesthesia type: Spinal   Pre-op diagnosis: Left knee osteoarthritis   Location: [place] 09 / [facility]   Surgeons: Paralee Cancel, MD       DISCUSSION:86 y.o. never smoker with h/o HTN, nonobstructive CAD, s/p AV replacements, s/p MVR, atrial fibrillation s/p MAZE (on Warfarin), left knee OA scheduled for above procedure 09/19/2021 with Dr. Paralee Cancel.   Pt seen by cardiology 08/10/2021. Per OV note, "The patient affirms he has been doing well without any new cardiac symptoms. They are able to achieve 4 METS without cardiac limitations. Therefore, based on ACC/AHA guidelines, the patient would be at acceptable risk for the planned procedure without further cardiovascular testing. The patient was advised that if he develops new symptoms prior to surgery to contact our office to arrange for a follow-up visit, and he verbalized understanding.  { Mr. Keast perioperative risk of a major cardiac event is 0.9% according to the Revised Cardiac Risk Index (RCRI).  Therefore, he is at low risk for perioperative complications.   His functional capacity is fair at 4.31 METs according to the [facility] Activity Status Index (DASI). Recommendations: According to ACC/AHA guidelines, no further cardiovascular testing needed.  The patient may proceed to surgery at acceptable risk.   Antiplatelet and/or Anticoagulation Recommendations:   Coumadin can by held for 5 days prior to surgery.  Please resume post op when felt to be safe. Patient WILL need need bridging with Lovenox (enoxaparin) around procedure"  Incidental frontal infarct May 2023 in setting of sepsis.  Followed by neurology. Clearance requested.   Clearance from neurology received which states pt is optimized for planned procedure. Per note pt is 4 months post stroke. Low to mod risk of recurrent stroke off warfarin.   Anticipate pt  can proceed with planned procedure barring acute status change.   VS: BP (!) 151/81   Pulse 75   Temp 36.6 C (Oral)   Resp 12   Ht 6' (1.829 m)   Wt 81.6 kg   SpO2 98%   BMI 24.41 kg/m   PROVIDERS: Marda Stalker, PA-C is PCP   Primary Cardiologist:  Candee Furbish, MD LABS: Labs reviewed: Acceptable for surgery. (all labs ordered are listed, but only abnormal results are displayed)  Labs Reviewed  BASIC METABOLIC PANEL - Abnormal; Notable for the following components:      Result Value   Chloride 112 (*)    Glucose, Bld 165 (*)    All other components within normal limits  CBC - Abnormal; Notable for the following components:   RBC 3.85 (*)    Hemoglobin 12.4 (*)    HCT 37.8 (*)    Platelets 133 (*)    All other components within normal limits  SURGICAL PCR SCREEN  TYPE AND SCREEN     IMAGES:   EKG:   CV: Echo 03/22/2021 1. Left ventricular ejection fraction, by estimation, is 60 to 65%. Left  ventricular ejection fraction by PLAX is 78 %. The left ventricle has  normal function. The left ventricle has no regional wall motion  abnormalities. There is mild concentric left  ventricular hypertrophy. Left ventricular diastolic parameters are  indeterminate.   2. Right ventricular systolic function is normal. The right ventricular  size is normal. There is moderately elevated pulmonary artery systolic  pressure.   3. Left atrial size was severely dilated.   4. Compared with  the echo 03/2020, mean gradient across the repaired  mitral valve has increased from 6 mmHg to 9 mmHg. The mitral valve has  been repaired/replaced. Trivial mitral valve regurgitation. No evidence of  mitral stenosis. There is a 28 mm Sorin   [employer] Annuloflex ring prosthetic annuloplasty ring present in the  mitral position. Procedure Date: 01/30/2018.   5. Tricuspid valve annuloplasty ring. The tricuspid valve is has been  repaired/replaced.   6. Unchanged from echo 03/2020. The aortic  valve is normal in structure.  Aortic valve regurgitation is not visualized. No aortic stenosis is  present. There is a 25 mm [facility] Ease valve present in the aortic  position. Procedure Date: 08/03/2010. Echo  findings are consistent with normal structure and function of the aortic  valve prosthesis.   7. Aortic dilatation noted. There is mild dilatation of the aortic root,  measuring 41 mm. There is mild dilatation of the ascending aorta,  measuring 36 mm.   8. The inferior vena cava is normal in size with greater than 50%  respiratory variability, suggesting right atrial pressure of 3 mmHg.   Cardiac Cath 10/14/2017 Prox Cx to Mid Cx lesion is 30% stenosed. Ost LAD to Prox LAD lesion is 30% stenosed. Prox LAD to Mid LAD lesion is 40% stenosed.   1.  Nonobstructive coronary artery disease with no significant change compared to 2012 cardiac catheterization study 2.  Large V waves in the pulmonary capillary wedge tracing consistent with severe mitral insufficiency   Recommend: referral to cardiac surgery for further evaluation of treatment options (surgical mitral valve repair + Maze versus percutaneous mitral valve repair)   Resume apixaban tomorrow morning Past Medical History:  Diagnosis Date   Acquired dilation of ascending aorta and aortic root (HCC)    70m aortic root and 443mascending aorta on echo 03/2020   Arthritis    Bladder stones    Borderline diabetes    BPH (benign prostatic hyperplasia)    CAD (coronary artery disease) 12/19/2020   Non-obstructive coronary artery disease at cath in 2019   Coronary artery disease    cardiologist-  dr skMarlou PorchloCecille Rubinerhart NP--- per cath 06-02-2010 non-obstructive cad pLAD 30-40%   Diverticulosis of colon    Dysrhythmia    afib   Gout    Heart murmur    History of adenomatous polyp of colon    2002-- tubular adenoma   History of aortic insufficiency    severe -- s/p  AVR 08-03-2010   History of small bowel obstruction     02/ 2007 mechanical sbo s/p  surgical intervention;  partial sbo 09/ 2011 and 03-20-2011 resolved without surgical intervention   History of urinary retention    HTN (hypertension)    Peripheral neuropathy    Persistent atrial fibrillation (HCC)    S/P aortic valve replacement with prosthetic valve 08/03/2010   tissue valve   S/P Maze operation for atrial fibrillation 01/30/2018   Complete bilateral atrial lesion set using bipolar radiofrequency and cryothermy with clipping of LA appendage   S/P MVR (mitral valve repair) 01/30/2018   Complex valvuloplasty including artificial Gore-tex neochord placement x4 and [employer] Annuloflex ring annuloplasty, size 28   S/P patent foramen ovale closure 08/03/2010   at same time AVR   S/P tricuspid valve repair 01/30/2018   Using an MC3 Annuloplasty ring, size 28   [facility]   Tricuspid regurgitation     Past Surgical History:  Procedure Laterality Date  BIOPSY  09/07/2018   Procedure: BIOPSY;  Surgeon: Otis Brace, MD;  [facility];  Service: Gastroenterology;;   CARDIAC CATHETERIZATION  06-02-2010  dr Marlou Porch   non-obstructive cad- pLAD 30-40%/  normal LVSF/  severe AI   CARDIOVASCULAR STRESS TEST  04/12/2016   Low risk nuclear perfusion study w/ no significant reversible ischemia/  normal LV function and wall motion ,  stress ef 60%/  19m inferior and lateral scooped ST-segment depression w/ exercise (may be repolarization abnormality), exercise capacity was moderately reduced   CATARACT EXTRACTION W/ INTRAOCULAR LENS  IMPLANT, BILATERAL  02/2010   CLIPPING OF ATRIAL APPENDAGE N/A 01/30/2018   Procedure: CLIPPING OF LEFT ATRIAL APPENDAGE USING ATRICLIP PRO2 45MM;  Surgeon: ORexene Alberts MD;  [place]  Service: Open Heart Surgery;  Laterality: N/A;   COLONOSCOPY WITH PROPOFOL N/A 10/21/2018   Procedure: COLONOSCOPY WITH PROPOFOL;  Surgeon: BOtis Brace MD;  [facility];  Service:  Gastroenterology;  Laterality: N/A;   CYSTOSCOPY WITH LITHOLAPAXY N/A 06/05/2016   Procedure: CYSTOSCOPY WITH LITHOLAPAXY and fulgarization of bladder neck;  Surgeon: WIrine Seal MD;  [facility]  Service: Urology;  Laterality: N/A;   ESOPHAGOGASTRODUODENOSCOPY (EGD) WITH PROPOFOL N/A 09/07/2018   Procedure: ESOPHAGOGASTRODUODENOSCOPY (EGD) WITH PROPOFOL;  Surgeon: BOtis Brace MD;  [facility];  Service: Gastroenterology;  Laterality: N/A;   EXPLORATORY LAPARTOMY /  CHOLECYSTECTOMY  02/28/2005   for Small  bowel obstruction (mechnical)   IR ANGIO EXTRACRAN SEL COM CAROTID INNOMINATE UNI L MOD SED  10/24/2016   IR ANGIO VERTEBRAL SEL SUBCLAVIAN INNOMINATE BILAT MOD SED  10/24/2016   IR PERCUTANEOUS ART THROMBECTOMY/INFUSION INTRACRANIAL INC DIAG ANGIO  10/24/2016   IR RADIOLOGIST EVAL & MGMT  12/05/2016   LEFT KNEE ARTHROSCOPY  2006   MAZE N/A 01/30/2018   Procedure: MAZE;  Surgeon: ORexene Alberts MD;  [place]  Service: Open Heart Surgery;  Laterality: N/A;   MITRAL VALVE REPAIR N/A 01/30/2018   Procedure: MITRAL VALVE REPAIR (MVR) USING CARBOMEDICS ANNULOFLEX SIZE 28;  Surgeon: ORexene Alberts MD;  [place]  Service: Open Heart Surgery;  Laterality: N/A;   POLYPECTOMY  10/21/2018   Procedure: POLYPECTOMY;  Surgeon: BOtis Brace MD;  [facility];  Service: Gastroenterology;;   RADIOLOGY WITH ANESTHESIA N/A 10/24/2016   Procedure: RADIOLOGY WITH ANESTHESIA;  Surgeon: DLuanne Bras MD;  [place]  Service: Radiology;  Laterality: N/A;   RIGHT FOOT SURGERY     RIGHT MINIATURE ANTERIOR THORACOTOMY FOR AORTIC VALVE REPLACEMENT AND CLOSURE PATENT FORAMEN OVALE  08-03-2010  DR OLevada SchillingMagna-ease pericardial tissue valve (262m   RIGHT/LEFT HEART CATH AND CORONARY ANGIOGRAPHY N/A 10/14/2017   Procedure: RIGHT/LEFT HEART CATH AND CORONARY ANGIOGRAPHY;  Surgeon: CoSherren MochaMD;  [facility];  Service: Cardiovascular;  Laterality: N/A;   TEE WITHOUT CARDIOVERSION N/A 10/14/2017   Procedure: TRANSESOPHAGEAL ECHOCARDIOGRAM (TEE);  Surgeon: NiJosue HectorMD;  [facility];  Service: Cardiovascular;  Laterality: N/A;   TOTAL HIP ARTHROPLASTY Left 08/02/2020   Procedure: TOTAL HIP ARTHROPLASTY ANTERIOR APPROACH;  Surgeon: OlParalee CancelMD;  [facility];  Service: Orthopedics;  Laterality: Left;   TRANSTHORACIC ECHOCARDIOGRAM  05/30/2016  dr skains   moderate  LVH ef 60-65%/  bioprothesis aortic valve present ,normal grandient and no AI /  mild MV calcification , moderate MR /  mild PR/ moderate TR/  PASP 3514m/ (RA denisty was identified 04-27-2016 echo)  and is seen again today, this is likely a promient eustacian ridge, atrium is normal size   TRICUSPID VALVE REPLACEMENT N/A 01/30/2018   Procedure: TRICUSPID VALVE REPAIR USING MC3 SIZE 28;  Surgeon: Rexene Alberts, MD;  Location: [place];  Service: Open Heart Surgery;  Laterality: N/A;    MEDICATIONS:  allopurinol (ZYLOPRIM) 300 MG tablet   amLODipine (NORVASC) 10 MG tablet   amoxicillin (AMOXIL) 500 MG capsule   atenolol (TENORMIN) 25 MG tablet   cholecalciferol (VITAMIN D3) 25 MCG (1000 UNIT) tablet   Cinnamon 500 MG capsule   clobetasol (TEMOVATE) 0.05 % external solution   enoxaparin (LOVENOX) 80 MG/0.8ML injection   feeding supplement (ENSURE ENLIVE / ENSURE PLUS) LIQD   ferrous sulfate 325 (65 FE) MG EC tablet   finasteride (PROSCAR) 5 MG tablet   gabapentin (NEURONTIN) 100 MG capsule   gabapentin (NEURONTIN) 300 MG capsule   Glucos-Chondroit-Collag-Hyal (GLUCOSAMINE CHONDROIT-COLLAGEN PO)   Multiple Vitamins-Minerals (CENTRUM SILVER ADULT 50+) TABS   rosuvastatin (CRESTOR) 10 MG tablet   tamsulosin (FLOMAX) 0.4 MG CAPS capsule   traZODone (DESYREL) 50 MG tablet   warfarin (COUMADIN) 5 MG tablet   No current facility-administered medications for this encounter.     Konrad Felix Ward,  PA-C [facility] Pre-Surgical Testing [phone]

## 2021-09-15 NOTE — Anesthesia Preprocedure Evaluation (Signed)
Anesthesia Evaluation  Patient identified by MRN, date of birth, ID band Patient awake    Reviewed: Allergy & Precautions, NPO status , Patient's Chart, lab work & pertinent test results  Airway Mallampati: II  TM Distance: >3 FB Neck ROM: Full    Dental   Pulmonary neg pulmonary ROS,    breath sounds clear to auscultation       Cardiovascular hypertension, Pt. on medications + CAD and +CHF  + dysrhythmias Atrial Fibrillation + Valvular Problems/Murmurs  Rhythm:Regular Rate:Normal     Neuro/Psych  Neuromuscular disease CVA    GI/Hepatic negative GI ROS, Neg liver ROS,   Endo/Other  negative endocrine ROS  Renal/GU negative Renal ROS     Musculoskeletal  (+) Arthritis ,   Abdominal   Peds  Hematology  (+) Blood dyscrasia, anemia , Last Lovenox 9/11 AM. Last coumadin 9/6. INR 1.2. Plts 133.   Anesthesia Other Findings   Reproductive/Obstetrics                          Echo 03/22/2021 1. Left ventricular ejection fraction, by estimation, is 60 to 65%. Left  ventricular ejection fraction by PLAX is 78 %. The left ventricle has  normal function. The left ventricle has no regional wall motion  abnormalities. There is mild concentric left  ventricular hypertrophy. Left ventricular diastolic parameters are  indeterminate.  2. Right ventricular systolic function is normal. The right ventricular  size is normal. There is moderately elevated pulmonary artery systolic  pressure.  3. Left atrial size was severely dilated.  4. Compared with the echo 03/2020, mean gradient across the repaired  mitral valve has increased from 6 mmHg to 9 mmHg. The mitral valve has  been repaired/replaced. Trivial mitral valve regurgitation. No evidence of  mitral stenosis. There is a 28 mm Sorin  [employer] Annuloflex ring prosthetic annuloplasty ring present in the  mitral position. Procedure Date: 01/30/2018.  5.  Tricuspid valve annuloplasty ring. The tricuspid valve is has been  repaired/replaced.  6. Unchanged from echo 03/2020. The aortic valve is normal in structure.  Aortic valve regurgitation is not visualized. No aortic stenosis is  present. There is a 25 mm [facility] Ease valve present in the aortic  position. Procedure Date: 08/03/2010. Echo  findings are consistent with normal structure and function of the aortic  valve prosthesis.  7. Aortic dilatation noted. There is mild dilatation of the aortic root,  measuring 41 mm. There is mild dilatation of the ascending aorta,  measuring 36 mm.  8. The inferior vena cava is normal in size with greater than 50%  respiratory variability, suggesting right atrial pressure of 3 mmHg.  Lab Results  Component Value Date   WBC 6.2 09/13/2021   HGB 12.4 (L) 09/13/2021   HCT 37.8 (L) 09/13/2021   MCV 98.2 09/13/2021   PLT 133 (L) 09/13/2021   Lab Results  Component Value Date   CREATININE 1.03 09/13/2021   BUN 22 09/13/2021   NA 144 09/13/2021   K 4.1 09/13/2021   CL 112 (H) 09/13/2021   CO2 25 09/13/2021   Lab Results  Component Value Date   INR 1.2 09/19/2021   INR 2.5 09/12/2021   INR 2.5 08/16/2021    Anesthesia Physical Anesthesia Plan  ASA: 3  Anesthesia Plan: Spinal   Post-op Pain Management: Regional block* and Tylenol PO (pre-op)*   Induction:   PONV Risk Score and Plan: 1 and Dexamethasone, Ondansetron, Propofol  infusion and Treatment may vary due to age or medical condition  Airway Management Planned: Natural Airway and Simple Face Mask  Additional Equipment:   Intra-op Plan:   Post-operative Plan:   Informed Consent: I have reviewed the patients History and Physical, chart, labs and discussed the procedure including the risks, benefits and alternatives for the proposed anesthesia with the patient or authorized representative who has indicated his/her understanding and acceptance.     Dental advisory  given  Plan Discussed with: CRNA  Anesthesia Plan Comments:       Anesthesia Quick Evaluation

## 2021-09-18 NOTE — H&P (Signed)
TOTAL KNEE ADMISSION H&P  Patient is being admitted for left total knee arthroplasty.  Subjective:  Chief Complaint:left knee pain.  HPI: Gerald Amas., 86 y.o. male, has a history of pain and functional disability in the left knee due to arthritis and has failed non-surgical conservative treatments for greater than 12 weeks to includeNSAID's and/or analgesics, corticosteriod injections, and activity modification.  Onset of symptoms was gradual, starting 2 years ago with gradually worsening course since that time. The patient noted no past surgery on the left knee(s).  Patient currently rates pain in the left knee(s) at 8 out of 10 with activity. Patient has worsening of pain with activity and weight bearing, pain that interferes with activities of daily living, and pain with passive range of motion.  Patient has evidence of joint space narrowing by imaging studies.  There is no active infection.  Patient Active Problem List   Diagnosis Date Noted   Abnormal findings on diagnostic imaging of other specified body structures 06/02/2021   Hypoalbuminemia due to protein-calorie malnutrition ([place]) 05/29/2021   Valvular heart disease 05/28/2021   Acute CVA (cerebrovascular accident) ([place]) 05/21/2021   SIRS (systemic inflammatory response syndrome) ([place]) 05/21/2021   Generalized weakness 05/21/2021   Otitis externa 05/21/2021   CAD (coronary artery disease) 12/19/2020   Malnutrition of moderate degree 08/02/2020   S/P left total hip arthroplasty 08/02/2020   Closed left hip fracture, initial encounter ([place]) 08/01/2020   Chronic congestive heart failure ([place]) 05/26/2020   Elevated PSA 05/26/2020   History of adenomatous polyp of colon 05/26/2020   History of anemia 05/26/2020   Hypercoagulable state ([place]) 05/26/2020   Impairment of balance 05/26/2020   Peripheral neuropathy 05/26/2020   Personal history of other malignant neoplasm of skin 05/26/2020   Prediabetes 05/26/2020   Presence  of prosthetic heart valve 05/26/2020   Sleep disorder 05/26/2020   Thrombocytopenia ([place]) 05/26/2020   Vitamin D deficiency 05/26/2020   Acquired dilation of ascending aorta and aortic root (HCC)    Anemia 09/05/2018   Anemia due to chronic blood loss 09/05/2018   Long term current use of anticoagulant 02/10/2018   s/p mitral valve repair 01/30/2018   S/P tricuspid valve repair 01/30/2018   S/P Maze operation for atrial fibrillation 01/30/2018   Tricuspid regurgitation    Mitral valve regurgitation 10/14/2017   History of cerebrovascular accident 11/27/2016   Atrial fibrillation ([place])    Hyperglycemia    Right middle cerebral artery stroke ([place]) 89/38/1017   Acute embolic stroke (HCC)    Hypokalemia    Left hemiparesis (HCC)    Dysphagia, post-stroke    S/P AVR (aortic valve replacement)    Benign prostatic hyperplasia with lower urinary tract symptoms    CVA (cerebral vascular accident) ([place]) 10/24/2016   Essential hypertension    Gout    History of small bowel obstruction    History of BPH    S/P AVR 08/03/2010   Hypertrophy of prostate with urinary obstruction and other lower urinary tract symptoms (LUTS) 05/21/2001   Past Medical History:  Diagnosis Date   Acquired dilation of ascending aorta and aortic root ([place])    43m aortic root and 440mascending aorta on echo 03/2020   Arthritis    Bladder stones    Borderline diabetes    BPH (benign prostatic hyperplasia)    CAD (coronary artery disease) 12/19/2020   Non-obstructive coronary artery disease at cath in 2019   Coronary artery disease    cardiologist-  dr  skains/  lori gerhart NP--- per cath 06-02-2010 non-obstructive cad pLAD 30-40%   Diverticulosis of colon    Dysrhythmia    afib   Gout    Heart murmur    History of adenomatous polyp of colon    2002-- tubular adenoma   History of aortic insufficiency    severe -- s/p  AVR 08-03-2010   History of small bowel obstruction    02/ 2007 mechanical sbo s/p   surgical intervention;  partial sbo 09/ 2011 and 03-20-2011 resolved without surgical intervention   History of urinary retention    HTN (hypertension)    Peripheral neuropathy    Persistent atrial fibrillation (HCC)    S/P aortic valve replacement with prosthetic valve 08/03/2010   tissue valve   S/P Maze operation for atrial fibrillation 01/30/2018   Complete bilateral atrial lesion set using bipolar radiofrequency and cryothermy with clipping of LA appendage   S/P MVR (mitral valve repair) 01/30/2018   Complex valvuloplasty including artificial Gore-tex neochord placement x4 and [employer] medics Annuloflex ring annuloplasty, size 28   S/P patent foramen ovale closure 08/03/2010   at same time AVR   S/P tricuspid valve repair 01/30/2018   Using an MC3 Annuloplasty ring, size 28   Stroke ([place])    Tricuspid regurgitation     Past Surgical History:  Procedure Laterality Date   BIOPSY  09/07/2018   Procedure: BIOPSY;  Surgeon: Otis Brace, MD;  [facility]: [facility];  Service: Gastroenterology;;   CARDIAC CATHETERIZATION  06-02-2010  dr Marlou Porch   non-obstructive cad- pLAD 30-40%/  normal LVSF/  severe AI   CARDIOVASCULAR STRESS TEST  04/12/2016   Low risk nuclear perfusion study w/ no significant reversible ischemia/  normal LV function and wall motion ,  stress ef 60%/  23m inferior and lateral scooped ST-segment depression w/ exercise (may be repolarization abnormality), exercise capacity was moderately reduced   CATARACT EXTRACTION W/ INTRAOCULAR LENS  IMPLANT, BILATERAL  02/2010   CLIPPING OF ATRIAL APPENDAGE N/A 01/30/2018   Procedure: CLIPPING OF LEFT ATRIAL APPENDAGE USING ATRICLIP PRO2 45MM;  Surgeon: ORexene Alberts MD;  [place]  Service: Open Heart Surgery;  Laterality: N/A;   COLONOSCOPY WITH PROPOFOL N/A 10/21/2018   Procedure: COLONOSCOPY WITH PROPOFOL;  Surgeon: BOtis Brace MD;  [facility];  Service: Gastroenterology;  Laterality: N/A;    CYSTOSCOPY WITH LITHOLAPAXY N/A 06/05/2016   Procedure: CYSTOSCOPY WITH LITHOLAPAXY and fulgarization of bladder neck;  Surgeon: WIrine Seal MD;  [facility]  Service: Urology;  Laterality: N/A;   ESOPHAGOGASTRODUODENOSCOPY (EGD) WITH PROPOFOL N/A 09/07/2018   Procedure: ESOPHAGOGASTRODUODENOSCOPY (EGD) WITH PROPOFOL;  Surgeon: BOtis Brace MD;  [facility];  Service: Gastroenterology;  Laterality: N/A;   EXPLORATORY LAPARTOMY /  CHOLECYSTECTOMY  02/28/2005   for Small  bowel obstruction (mechnical)   IR ANGIO EXTRACRAN SEL COM CAROTID INNOMINATE UNI L MOD SED  10/24/2016   IR ANGIO VERTEBRAL SEL SUBCLAVIAN INNOMINATE BILAT MOD SED  10/24/2016   IR PERCUTANEOUS ART THROMBECTOMY/INFUSION INTRACRANIAL INC DIAG ANGIO  10/24/2016   IR RADIOLOGIST EVAL & MGMT  12/05/2016   LEFT KNEE ARTHROSCOPY  2006   MAZE N/A 01/30/2018   Procedure: MAZE;  Surgeon: ORexene Alberts MD;  [place] [facility]  Service: Open Heart Surgery;  Laterality: N/A;   MITRAL VALVE REPAIR N/A 01/30/2018   Procedure: MITRAL VALVE REPAIR (MVR) USING CARBOMEDICS ANNULOFLEX SIZE 28;  Surgeon: ORexene Alberts MD;  [place]  Service:  Open Heart Surgery;  Laterality: N/A;   POLYPECTOMY  10/21/2018   Procedure: POLYPECTOMY;  Surgeon: Otis Brace, MD;  [facility];  Service: Gastroenterology;;   RADIOLOGY WITH ANESTHESIA N/A 10/24/2016   Procedure: RADIOLOGY WITH ANESTHESIA;  Surgeon: Luanne Bras, MD;  Location: [place];  Service: Radiology;  Laterality: N/A;   RIGHT FOOT SURGERY     RIGHT MINIATURE ANTERIOR THORACOTOMY FOR AORTIC VALVE REPLACEMENT AND CLOSURE PATENT FORAMEN OVALE  08-03-2010  DR Levada Schilling Magna-ease pericardial tissue valve (53m)   RIGHT/LEFT HEART CATH AND CORONARY ANGIOGRAPHY N/A 10/14/2017   Procedure: RIGHT/LEFT HEART CATH AND CORONARY ANGIOGRAPHY;  Surgeon: CSherren Mocha MD;  [facility];  Service: Cardiovascular;   Laterality: N/A;   TEE WITHOUT CARDIOVERSION N/A 10/14/2017   Procedure: TRANSESOPHAGEAL ECHOCARDIOGRAM (TEE);  Surgeon: NJosue Hector MD;  [facility];  Service: Cardiovascular;  Laterality: N/A;   TOTAL HIP ARTHROPLASTY Left 08/02/2020   Procedure: TOTAL HIP ARTHROPLASTY ANTERIOR APPROACH;  Surgeon: OParalee Cancel MD;  [facility];  Service: Orthopedics;  Laterality: Left;   TRANSTHORACIC ECHOCARDIOGRAM  05/30/2016  dr skains   moderate  LVH ef 60-65%/  bioprothesis aortic valve present ,normal grandient and no AI /  mild MV calcification , moderate MR /  mild PR/ moderate TR/  PASP 363mg/ (RA denisty was identified 04-27-2016 echo) and is seen again today, this is likely a promient eustacian ridge, atrium is normal size   TRICUSPID VALVE REPLACEMENT N/A 01/30/2018   Procedure: TRICUSPID VALVE REPAIR USING MC3 SIZE 28;  Surgeon: OwRexene AlbertsMD;  [place] [facility] Service: Open Heart Surgery;  Laterality: N/A;    No current facility-administered medications for this encounter.   Current Outpatient Medications  Medication Sig Dispense Refill Last Dose   allopurinol (ZYLOPRIM) 300 MG tablet Take 150 mg by mouth every morning.      amLODipine (NORVASC) 10 MG tablet TAKE 1 TABLET BY MOUTH  DAILY 90 tablet 2    amoxicillin (AMOXIL) 500 MG capsule Take 2,000 mg by mouth See admin instructions. Take 4 capsules (2000 mg) by mouth 1 hour prior to dental appointment      atenolol (TENORMIN) 25 MG tablet Take 0.5 tablets (12.5 mg total) by mouth daily.      cholecalciferol (VITAMIN D3) 25 MCG (1000 UNIT) tablet Take 1,000 Units by mouth daily.      Cinnamon 500 MG capsule Take 500 mg by mouth every morning.      clobetasol (TEMOVATE) 0.05 % external solution Apply 1 Application topically 2 (two) times daily as needed (itchy scalp).      feeding supplement (ENSURE ENLIVE / ENSURE PLUS) LIQD Take 237 mLs by mouth 2 (two) times daily between meals. (Patient taking differently: Take 237  mLs by mouth once a week.) 237 mL 12    ferrous sulfate 325 (65 FE) MG EC tablet Take 325 mg by mouth 2 (two) times daily.      finasteride (PROSCAR) 5 MG tablet Take 1 tablet (5 mg total) by mouth daily. 30 tablet 0    gabapentin (NEURONTIN) 100 MG capsule Take 100 mg by mouth in the morning.      gabapentin (NEURONTIN) 300 MG capsule Take 1 capsule (300 mg total) by mouth 2 (two) times daily. (Patient taking differently: Take 300 mg by mouth at bedtime.) 60 capsule 5    Glucos-Chondroit-Collag-Hyal (GLUCOSAMINE CHONDROIT-COLLAGEN PO) Take 1 tablet by mouth 2 (two) times daily.  Multiple Vitamins-Minerals (CENTRUM SILVER ADULT 50+) TABS Take 1 tablet by mouth daily.      rosuvastatin (CRESTOR) 10 MG tablet Take 1 tablet (10 mg total) by mouth daily. 90 tablet 3    tamsulosin (FLOMAX) 0.4 MG CAPS capsule Take 2 capsules (0.8 mg total) by mouth daily after breakfast. 30 capsule 0    traZODone (DESYREL) 50 MG tablet Take 50 mg by mouth at bedtime as needed for sleep.      warfarin (COUMADIN) 5 MG tablet Take 1 tablet (5 mg total) by mouth daily at 4 PM. Until seen by warfarin clinic. (Patient taking differently: Take 5-7.5 mg by mouth See admin instructions. Take 7.5 mg on Mondays Take 5 mg on all other days) 135 tablet 3    enoxaparin (LOVENOX) 80 MG/0.8ML injection Inject 0.8 mLs (80 mg total) into the skin every 12 (twelve) hours. 16 mL 1    Allergies  Allergen Reactions   Sulfa Antibiotics Other (See Comments)    Granulocytosis   Lisinopril Cough    Social History   Tobacco Use   Smoking status: Never   Smokeless tobacco: Never  Substance Use Topics   Alcohol use: Yes    Comment: ONE OR TWO PER MONTH    Family History  Problem Relation Age of Onset   Heart disease Mother    Brain cancer Father      Review of Systems  Constitutional:  Negative for chills and fever.  Respiratory:  Negative for cough and shortness of breath.   Cardiovascular:  Negative for chest pain.   Gastrointestinal:  Negative for nausea and vomiting.  Musculoskeletal:  Positive for arthralgias.     Objective:  Physical Exam Well nourished and well developed. General: Alert and oriented x3, cooperative and pleasant, no acute distress. Head: normocephalic, atraumatic, neck supple. Eyes: EOMI.  Musculoskeletal: Bilateral knee exams: No palpable effusions, warmth or erythema Slight flexion contractures Tenderness of the medial aspect of both of his knees left greater than right Flexion over 110 degrees with tightness and mild crepitation   Calves soft and nontender. Motor function intact in LE. Strength 5/5 LE bilaterally. Neuro: Distal pulses 2+. Sensation to light touch intact in LE.  Vital signs in last 24 hours:    Labs:   Estimated body mass index is 24.41 kg/m as calculated from the following:   Height as of 09/13/21: 6' (1.829 m).   Weight as of 09/13/21: 81.6 kg.   Imaging Review Plain radiographs demonstrate severe degenerative joint disease of the left knee(s). The overall alignment isneutral. The bone quality appears to be adequate for age and reported activity level.      Assessment/Plan:  End stage arthritis, left knee   The patient history, physical examination, clinical judgment of the provider and imaging studies are consistent with end stage degenerative joint disease of the left knee(s) and total knee arthroplasty is deemed medically necessary. The treatment options including medical management, injection therapy arthroscopy and arthroplasty were discussed at length. The risks and benefits of total knee arthroplasty were presented and reviewed. The risks due to aseptic loosening, infection, stiffness, patella tracking problems, thromboembolic complications and other imponderables were discussed. The patient acknowledged the explanation, agreed to proceed with the plan and consent was signed. Patient is being admitted for inpatient treatment for  surgery, pain control, PT, OT, prophylactic antibiotics, VTE prophylaxis, progressive ambulation and ADL's and discharge planning. The patient is planning to be discharged  home.  Therapy Plans: outpatient [facility] at [facility]  Disposition: Home with wife Planned DVT Prophylaxis: Coumadin DME needed: none PCP: Marda Stalker, PA - clearance received Cardiologist: [name] Neurologist: (hx of stroke) TXA: IV Allergies: sulfa drugs - granulocytosis Anesthesia Concerns: none BMI: 24.6 Last HgbA1c: Not diabetic   Other: - In & out cath ** urinary retention with hip replacement - oxycodone, tylenol, robaxin    Patient's anticipated LOS is less than 2 midnights, meeting these requirements: - Younger than 46 - Lives within 1 hour of care - Has a competent adult at home to recover with post-op recover - NO history of  - Chronic pain requiring opiods  - Diabetes  - Coronary Artery Disease  - Heart failure  - Heart attack  - Stroke  - DVT/VTE  - Cardiac arrhythmia  - Respiratory Failure/COPD  - Renal failure  - Anemia  - Advanced Liver disease  Costella Hatcher, PA-C Orthopedic Surgery [facility] [place] [phone]

## 2021-09-19 ENCOUNTER — Ambulatory Visit (HOSPITAL_BASED_OUTPATIENT_CLINIC_OR_DEPARTMENT_OTHER): Payer: Medicare Other | Admitting: Certified Registered"

## 2021-09-19 ENCOUNTER — Ambulatory Visit (HOSPITAL_COMMUNITY): Payer: Medicare Other | Admitting: Physician Assistant

## 2021-09-19 ENCOUNTER — Observation Stay (HOSPITAL_COMMUNITY)
Admission: RE | Admit: 2021-09-19 | Discharge: 2021-09-20 | Disposition: A | Discharge status: Home / Self Care | Payer: Medicare Other | Attending: Orthopedic Surgery | Admitting: Orthopedic Surgery

## 2021-09-19 ENCOUNTER — Encounter (HOSPITAL_COMMUNITY): Payer: Self-pay | Admitting: Orthopedic Surgery

## 2021-09-19 ENCOUNTER — Encounter (HOSPITAL_COMMUNITY)
Admission: RE | Disposition: A | Discharge status: Home / Self Care | Payer: Self-pay | Source: Home / Self Care | Attending: Orthopedic Surgery

## 2021-09-19 ENCOUNTER — Other Ambulatory Visit: Payer: Self-pay

## 2021-09-19 DIAGNOSIS — Z96642 Presence of left artificial hip joint: Secondary | ICD-10-CM | POA: Insufficient documentation

## 2021-09-19 DIAGNOSIS — I509 Heart failure, unspecified: Secondary | ICD-10-CM | POA: Diagnosis not present

## 2021-09-19 DIAGNOSIS — I110 Hypertensive heart disease with heart failure: Secondary | ICD-10-CM | POA: Diagnosis not present

## 2021-09-19 DIAGNOSIS — I10 Essential (primary) hypertension: Secondary | ICD-10-CM | POA: Diagnosis not present

## 2021-09-19 DIAGNOSIS — Z96652 Presence of left artificial knee joint: Secondary | ICD-10-CM

## 2021-09-19 DIAGNOSIS — Z79899 Other long term (current) drug therapy: Secondary | ICD-10-CM | POA: Diagnosis not present

## 2021-09-19 DIAGNOSIS — M1712 Unilateral primary osteoarthritis, left knee: Principal | ICD-10-CM | POA: Insufficient documentation

## 2021-09-19 DIAGNOSIS — Z7901 Long term (current) use of anticoagulants: Secondary | ICD-10-CM | POA: Diagnosis not present

## 2021-09-19 DIAGNOSIS — Z8673 Personal history of transient ischemic attack (TIA), and cerebral infarction without residual deficits: Secondary | ICD-10-CM | POA: Diagnosis not present

## 2021-09-19 DIAGNOSIS — G8918 Other acute postprocedural pain: Secondary | ICD-10-CM | POA: Diagnosis not present

## 2021-09-19 DIAGNOSIS — I2510 Atherosclerotic heart disease of native coronary artery without angina pectoris: Secondary | ICD-10-CM

## 2021-09-19 DIAGNOSIS — Z85828 Personal history of other malignant neoplasm of skin: Secondary | ICD-10-CM | POA: Insufficient documentation

## 2021-09-19 DIAGNOSIS — I4819 Other persistent atrial fibrillation: Secondary | ICD-10-CM | POA: Insufficient documentation

## 2021-09-19 HISTORY — PX: TOTAL KNEE ARTHROPLASTY: SHX125

## 2021-09-19 LAB — TYPE AND SCREEN
ABO/RH(D): O POS
Antibody Screen: NEGATIVE

## 2021-09-19 LAB — PROTIME-INR
INR: 1.2 (ref 0.8–1.2)
Prothrombin Time: 15.2 seconds (ref 11.4–15.2)

## 2021-09-19 SURGERY — ARTHROPLASTY, KNEE, TOTAL
Anesthesia: Spinal | Site: Knee | Laterality: Left

## 2021-09-19 MED ORDER — METOCLOPRAMIDE HCL 5 MG PO TABS: 5.0000 mg | ORAL_TABLET | Freq: Three times a day (TID) | ORAL | Status: DC | PRN

## 2021-09-19 MED ORDER — AMLODIPINE BESYLATE 10 MG PO TABS: 10.0000 mg | ORAL_TABLET | Freq: Every day | ORAL | Status: DC

## 2021-09-19 MED ORDER — ALLOPURINOL 300 MG PO TABS
150.0000 mg | ORAL_TABLET | Freq: Every morning | ORAL | Status: DC
  Filled 2021-09-19: qty 1

## 2021-09-19 MED ORDER — CEFAZOLIN SODIUM-DEXTROSE 2-4 GM/100ML-% IV SOLN
2.0000 g | INTRAVENOUS | Status: AC
Start: 2021-09-19 — End: 2021-09-19
  Administered 2021-09-19: 2 g via INTRAVENOUS
  Filled 2021-09-19: qty 100

## 2021-09-19 MED ORDER — SODIUM CHLORIDE (PF) 0.9 % IJ SOLN
INTRAMUSCULAR | Status: AC
  Filled 2021-09-19: qty 30

## 2021-09-19 MED ORDER — METHOCARBAMOL 500 MG IVPB - SIMPLE MED: 500.0000 mg | Freq: Four times a day (QID) | INTRAVENOUS | Status: DC | PRN

## 2021-09-19 MED ORDER — TRANEXAMIC ACID-NACL 1000-0.7 MG/100ML-% IV SOLN
1000.0000 mg | INTRAVENOUS | Status: AC
  Administered 2021-09-19: 1000 mg via INTRAVENOUS
  Filled 2021-09-19: qty 100

## 2021-09-19 MED ORDER — ROSUVASTATIN CALCIUM 10 MG PO TABS
10.0000 mg | ORAL_TABLET | Freq: Every day | ORAL | Status: DC
  Filled 2021-09-19: qty 1

## 2021-09-19 MED ORDER — ONDANSETRON HCL 4 MG/2ML IJ SOLN: 4.0000 mg | Freq: Four times a day (QID) | INTRAMUSCULAR | Status: DC | PRN

## 2021-09-19 MED ORDER — LACTATED RINGERS IV SOLN
INTRAVENOUS | Status: DC
Start: 2021-09-19 — End: 2021-09-19

## 2021-09-19 MED ORDER — HYDROMORPHONE HCL 1 MG/ML IJ SOLN: 0.5000 mg | INTRAMUSCULAR | Status: DC | PRN

## 2021-09-19 MED ORDER — PHENYLEPHRINE HCL-NACL 20-0.9 MG/250ML-% IV SOLN
INTRAVENOUS | Status: DC | PRN
  Administered 2021-09-19: 25 ug/min via INTRAVENOUS

## 2021-09-19 MED ORDER — DEXAMETHASONE SODIUM PHOSPHATE 10 MG/ML IJ SOLN
10.0000 mg | Freq: Once | INTRAMUSCULAR | Status: AC
  Administered 2021-09-20: 10 mg via INTRAVENOUS
  Filled 2021-09-19: qty 1

## 2021-09-19 MED ORDER — LACTATED RINGERS IV SOLN: INTRAVENOUS | Status: DC

## 2021-09-19 MED ORDER — CEFAZOLIN SODIUM-DEXTROSE 2-4 GM/100ML-% IV SOLN
2.0000 g | Freq: Four times a day (QID) | INTRAVENOUS | Status: AC
  Administered 2021-09-19 – 2021-09-20 (×2): 2 g via INTRAVENOUS
  Filled 2021-09-19 (×2): qty 100

## 2021-09-19 MED ORDER — POLYETHYLENE GLYCOL 3350 17 G PO PACK: 17.0000 g | PACK | Freq: Every day | ORAL | Status: DC | PRN

## 2021-09-19 MED ORDER — WARFARIN - PHARMACIST DOSING INPATIENT: Freq: Every day | Status: DC

## 2021-09-19 MED ORDER — ONDANSETRON HCL 4 MG/2ML IJ SOLN
INTRAMUSCULAR | Status: DC | PRN
  Administered 2021-09-19: 4 mg via INTRAVENOUS

## 2021-09-19 MED ORDER — PHENYLEPHRINE HCL (PRESSORS) 10 MG/ML IV SOLN
INTRAVENOUS | Status: AC
  Filled 2021-09-19: qty 1

## 2021-09-19 MED ORDER — 0.9 % SODIUM CHLORIDE (POUR BTL) OPTIME
TOPICAL | Status: DC | PRN
  Administered 2021-09-19: 1000 mL

## 2021-09-19 MED ORDER — PHENYLEPHRINE 80 MCG/ML (10ML) SYRINGE FOR IV PUSH (FOR BLOOD PRESSURE SUPPORT)
PREFILLED_SYRINGE | INTRAVENOUS | Status: AC
  Filled 2021-09-19: qty 10

## 2021-09-19 MED ORDER — BUPIVACAINE HCL (PF) 0.25 % IJ SOLN
INTRAMUSCULAR | Status: AC
  Filled 2021-09-19: qty 30

## 2021-09-19 MED ORDER — TRANEXAMIC ACID-NACL 1000-0.7 MG/100ML-% IV SOLN
1000.0000 mg | Freq: Once | INTRAVENOUS | Status: AC
  Administered 2021-09-19: 1000 mg via INTRAVENOUS
  Filled 2021-09-19: qty 100

## 2021-09-19 MED ORDER — FINASTERIDE 5 MG PO TABS
5.0000 mg | ORAL_TABLET | Freq: Every day | ORAL | Status: DC
  Administered 2021-09-19: 5 mg via ORAL
  Filled 2021-09-19 (×2): qty 1

## 2021-09-19 MED ORDER — ATENOLOL 25 MG PO TABS
12.5000 mg | ORAL_TABLET | Freq: Every day | ORAL | Status: DC
  Filled 2021-09-19: qty 1

## 2021-09-19 MED ORDER — GABAPENTIN 300 MG PO CAPS
300.0000 mg | ORAL_CAPSULE | Freq: Every day | ORAL | Status: DC
  Administered 2021-09-19: 300 mg via ORAL
  Filled 2021-09-19: qty 1

## 2021-09-19 MED ORDER — FENTANYL CITRATE PF 50 MCG/ML IJ SOSY
50.0000 ug | PREFILLED_SYRINGE | INTRAMUSCULAR | Status: DC
  Administered 2021-09-19: 50 ug via INTRAVENOUS
  Filled 2021-09-19: qty 2

## 2021-09-19 MED ORDER — BUPIVACAINE IN DEXTROSE 0.75-8.25 % IT SOLN
INTRATHECAL | Status: DC | PRN
  Administered 2021-09-19: 1.6 mL via INTRATHECAL

## 2021-09-19 MED ORDER — DEXAMETHASONE SODIUM PHOSPHATE 10 MG/ML IJ SOLN
8.0000 mg | Freq: Once | INTRAMUSCULAR | Status: AC
  Administered 2021-09-19: 8 mg via INTRAVENOUS

## 2021-09-19 MED ORDER — TRAZODONE HCL 50 MG PO TABS
50.0000 mg | ORAL_TABLET | Freq: Every evening | ORAL | Status: DC | PRN
  Administered 2021-09-20: 50 mg via ORAL
  Filled 2021-09-19: qty 1

## 2021-09-19 MED ORDER — ORAL CARE MOUTH RINSE: 15.0000 mL | Freq: Once | OROMUCOSAL | Status: AC

## 2021-09-19 MED ORDER — POVIDONE-IODINE 10 % EX SWAB
2.0000 | Freq: Once | CUTANEOUS | Status: AC
  Administered 2021-09-19: 2 via TOPICAL

## 2021-09-19 MED ORDER — KETOROLAC TROMETHAMINE 30 MG/ML IJ SOLN
INTRAMUSCULAR | Status: DC | PRN
  Administered 2021-09-19: 30 mg

## 2021-09-19 MED ORDER — EPHEDRINE SULFATE (PRESSORS) 50 MG/ML IJ SOLN
INTRAMUSCULAR | Status: DC | PRN
  Administered 2021-09-19: 10 mg via INTRAVENOUS
  Administered 2021-09-19: 5 mg via INTRAVENOUS
  Administered 2021-09-19: 10 mg via INTRAVENOUS
  Administered 2021-09-19: 5 mg via INTRAVENOUS
  Administered 2021-09-19: 10 mg via INTRAVENOUS

## 2021-09-19 MED ORDER — GABAPENTIN 100 MG PO CAPS
100.0000 mg | ORAL_CAPSULE | Freq: Every day | ORAL | Status: DC
  Filled 2021-09-19: qty 1

## 2021-09-19 MED ORDER — METOCLOPRAMIDE HCL 5 MG/ML IJ SOLN: 5.0000 mg | Freq: Three times a day (TID) | INTRAMUSCULAR | Status: DC | PRN

## 2021-09-19 MED ORDER — ENOXAPARIN SODIUM 80 MG/0.8ML IJ SOSY
80.0000 mg | PREFILLED_SYRINGE | Freq: Two times a day (BID) | INTRAMUSCULAR | Status: DC
  Administered 2021-09-20: 80 mg via SUBCUTANEOUS
  Filled 2021-09-19: qty 0.8

## 2021-09-19 MED ORDER — MENTHOL 3 MG MT LOZG: 1.0000 | LOZENGE | OROMUCOSAL | Status: DC | PRN

## 2021-09-19 MED ORDER — DOCUSATE SODIUM 100 MG PO CAPS
100.0000 mg | ORAL_CAPSULE | Freq: Two times a day (BID) | ORAL | Status: DC
  Administered 2021-09-19 – 2021-09-20 (×2): 100 mg via ORAL
  Filled 2021-09-19 (×2): qty 1

## 2021-09-19 MED ORDER — ACETAMINOPHEN 500 MG PO TABS
1000.0000 mg | ORAL_TABLET | Freq: Four times a day (QID) | ORAL | Status: DC
  Administered 2021-09-19 – 2021-09-20 (×4): 1000 mg via ORAL
  Filled 2021-09-19 (×4): qty 2

## 2021-09-19 MED ORDER — AMISULPRIDE (ANTIEMETIC) 5 MG/2ML IV SOLN: 10.0000 mg | Freq: Once | INTRAVENOUS | Status: DC | PRN

## 2021-09-19 MED ORDER — OXYCODONE HCL 5 MG PO TABS: 5.0000 mg | ORAL_TABLET | ORAL | Status: DC | PRN

## 2021-09-19 MED ORDER — METHOCARBAMOL 500 MG PO TABS: 500.0000 mg | ORAL_TABLET | Freq: Four times a day (QID) | ORAL | Status: DC | PRN

## 2021-09-19 MED ORDER — FERROUS SULFATE 325 (65 FE) MG PO TABS
325.0000 mg | ORAL_TABLET | Freq: Three times a day (TID) | ORAL | Status: DC
  Administered 2021-09-19 – 2021-09-20 (×2): 325 mg via ORAL
  Filled 2021-09-19 (×2): qty 1

## 2021-09-19 MED ORDER — CHLORHEXIDINE GLUCONATE 0.12 % MT SOLN
15.0000 mL | Freq: Once | OROMUCOSAL | Status: AC
  Administered 2021-09-19: 15 mL via OROMUCOSAL

## 2021-09-19 MED ORDER — ONDANSETRON HCL 4 MG/2ML IJ SOLN
INTRAMUSCULAR | Status: AC
  Filled 2021-09-19: qty 2

## 2021-09-19 MED ORDER — FENTANYL CITRATE PF 50 MCG/ML IJ SOSY: 25.0000 ug | PREFILLED_SYRINGE | INTRAMUSCULAR | Status: DC | PRN

## 2021-09-19 MED ORDER — AMLODIPINE BESYLATE 10 MG PO TABS
10.0000 mg | ORAL_TABLET | Freq: Every day | ORAL | Status: DC
  Filled 2021-09-19: qty 1

## 2021-09-19 MED ORDER — BISACODYL 10 MG RE SUPP: 10.0000 mg | Freq: Every day | RECTAL | Status: DC | PRN

## 2021-09-19 MED ORDER — SODIUM CHLORIDE 0.9 % IV SOLN: INTRAVENOUS | Status: DC

## 2021-09-19 MED ORDER — ACETAMINOPHEN 500 MG PO TABS: 1000.0000 mg | ORAL_TABLET | Freq: Once | ORAL | Status: DC

## 2021-09-19 MED ORDER — BUPIVACAINE HCL (PF) 0.25 % IJ SOLN
INTRAMUSCULAR | Status: DC | PRN
  Administered 2021-09-19: 30 mL

## 2021-09-19 MED ORDER — WARFARIN SODIUM 5 MG PO TABS
7.5000 mg | ORAL_TABLET | Freq: Once | ORAL | Status: AC
  Administered 2021-09-19: 7.5 mg via ORAL
  Filled 2021-09-19: qty 1

## 2021-09-19 MED ORDER — KETOROLAC TROMETHAMINE 30 MG/ML IJ SOLN
INTRAMUSCULAR | Status: AC
  Filled 2021-09-19: qty 1

## 2021-09-19 MED ORDER — SODIUM CHLORIDE (PF) 0.9 % IJ SOLN
INTRAMUSCULAR | Status: DC | PRN
  Administered 2021-09-19: 30 mL

## 2021-09-19 MED ORDER — DIPHENHYDRAMINE HCL 12.5 MG/5ML PO ELIX: 12.5000 mg | ORAL_SOLUTION | ORAL | Status: DC | PRN

## 2021-09-19 MED ORDER — DEXAMETHASONE SODIUM PHOSPHATE 10 MG/ML IJ SOLN
INTRAMUSCULAR | Status: AC
  Filled 2021-09-19: qty 1

## 2021-09-19 MED ORDER — SODIUM CHLORIDE 0.9 % IR SOLN
Status: DC | PRN
  Administered 2021-09-19: 1000 mL

## 2021-09-19 MED ORDER — ONDANSETRON HCL 4 MG PO TABS: 4.0000 mg | ORAL_TABLET | Freq: Four times a day (QID) | ORAL | Status: DC | PRN

## 2021-09-19 MED ORDER — OXYCODONE HCL 5 MG PO TABS: 10.0000 mg | ORAL_TABLET | ORAL | Status: DC | PRN

## 2021-09-19 MED ORDER — ROPIVACAINE HCL 5 MG/ML IJ SOLN
INTRAMUSCULAR | Status: DC | PRN
  Administered 2021-09-19: 20 mL via PERINEURAL

## 2021-09-19 MED ORDER — PROPOFOL 500 MG/50ML IV EMUL
INTRAVENOUS | Status: DC | PRN
  Administered 2021-09-19: 60 ug/kg/min via INTRAVENOUS

## 2021-09-19 MED ORDER — TAMSULOSIN HCL 0.4 MG PO CAPS
0.8000 mg | ORAL_CAPSULE | Freq: Every day | ORAL | Status: DC
  Filled 2021-09-19: qty 2

## 2021-09-19 MED ORDER — PHENOL 1.4 % MT LIQD: 1.0000 | OROMUCOSAL | Status: DC | PRN

## 2021-09-19 SURGICAL SUPPLY — 53 items
ADH SKN CLS APL DERMABOND .7 (GAUZE/BANDAGES/DRESSINGS) ×1
ATTUNE MED ANAT PAT 38 KNEE (Knees) IMPLANT
ATTUNE PS FEM LT SZ 7 CEM KNEE (Femur) IMPLANT
ATTUNE PSRP INSR SZ7 6 KNEE (Insert) IMPLANT
BAG COUNTER SPONGE SURGICOUNT (BAG) IMPLANT
BAG SPEC THK2 15X12 ZIP CLS (MISCELLANEOUS)
BAG SPNG CNTER NS LX DISP (BAG)
BAG ZIPLOCK 12X15 (MISCELLANEOUS) IMPLANT
BASE TIBIA ATTUNE KNEE SYS SZ6 (Knees) IMPLANT
BLADE SAW SGTL 11.0X1.19X90.0M (BLADE) IMPLANT
BLADE SAW SGTL 13.0X1.19X90.0M (BLADE) ×1 IMPLANT
BNDG ELASTIC 6X5.8 VLCR STR LF (GAUZE/BANDAGES/DRESSINGS) ×1 IMPLANT
BOWL SMART MIX CTS (DISPOSABLE) ×1 IMPLANT
BSPLAT TIB 6 CMNT ROT PLAT STR (Knees) ×1 IMPLANT
CEMENT HV SMART SET (Cement) IMPLANT
CUFF TOURN SGL QUICK 34 (TOURNIQUET CUFF) ×1
CUFF TRNQT CYL 34X4.125X (TOURNIQUET CUFF) ×1 IMPLANT
DERMABOND ADVANCED .7 DNX12 (GAUZE/BANDAGES/DRESSINGS) ×1 IMPLANT
DRAPE U-SHAPE 47X51 STRL (DRAPES) ×1 IMPLANT
DRESSING AQUACEL AG SP 3.5X10 (GAUZE/BANDAGES/DRESSINGS) ×1 IMPLANT
DRSG AQUACEL AG ADV 3.5X10 (GAUZE/BANDAGES/DRESSINGS) IMPLANT
DRSG AQUACEL AG SP 3.5X10 (GAUZE/BANDAGES/DRESSINGS) ×1
DURAPREP 26ML APPLICATOR (WOUND CARE) ×2 IMPLANT
ELECT REM PT RETURN 15FT ADLT (MISCELLANEOUS) ×1 IMPLANT
GLOVE BIO SURGEON STRL SZ 6 (GLOVE) ×1 IMPLANT
GLOVE BIOGEL PI IND STRL 6.5 (GLOVE) ×1 IMPLANT
GLOVE BIOGEL PI IND STRL 7.5 (GLOVE) ×1 IMPLANT
GLOVE ORTHO TXT STRL SZ7.5 (GLOVE) ×2 IMPLANT
GOWN STRL REUS W/ TWL LRG LVL3 (GOWN DISPOSABLE) ×2 IMPLANT
GOWN STRL REUS W/TWL LRG LVL3 (GOWN DISPOSABLE) ×2
HANDPIECE INTERPULSE COAX TIP (DISPOSABLE) ×1
HOLDER FOLEY CATH W/STRAP (MISCELLANEOUS) IMPLANT
KIT TURNOVER KIT A (KITS) IMPLANT
MANIFOLD NEPTUNE II (INSTRUMENTS) ×1 IMPLANT
NDL SAFETY ECLIP 18X1.5 (MISCELLANEOUS) IMPLANT
NS IRRIG 1000ML POUR BTL (IV SOLUTION) ×1 IMPLANT
PACK TOTAL KNEE CUSTOM (KITS) ×1 IMPLANT
PROTECTOR NERVE ULNAR (MISCELLANEOUS) ×1 IMPLANT
SET HNDPC FAN SPRY TIP SCT (DISPOSABLE) ×1 IMPLANT
SET PAD KNEE POSITIONER (MISCELLANEOUS) ×1 IMPLANT
SPIKE FLUID TRANSFER (MISCELLANEOUS) ×2 IMPLANT
SUT MNCRL AB 4-0 PS2 18 (SUTURE) ×1 IMPLANT
SUT STRATAFIX PDS+ 0 24IN (SUTURE) ×1 IMPLANT
SUT VIC AB 1 CT1 36 (SUTURE) ×1 IMPLANT
SUT VIC AB 2-0 CT1 27 (SUTURE) ×2
SUT VIC AB 2-0 CT1 TAPERPNT 27 (SUTURE) ×2 IMPLANT
SYR 3ML LL SCALE MARK (SYRINGE) ×1 IMPLANT
TIBIA ATTUNE KNEE SYS BASE SZ6 (Knees) ×1 IMPLANT
TOWEL GREEN STERILE FF (TOWEL DISPOSABLE) ×1 IMPLANT
TRAY FOLEY MTR SLVR 16FR STAT (SET/KITS/TRAYS/PACK) ×1 IMPLANT
TUBE SUCTION HIGH CAP CLEAR NV (SUCTIONS) ×1 IMPLANT
WATER STERILE IRR 1000ML POUR (IV SOLUTION) ×2 IMPLANT
WRAP KNEE MAXI GEL POST OP (GAUZE/BANDAGES/DRESSINGS) ×1 IMPLANT

## 2021-09-19 NOTE — Progress Notes (Signed)
ANTICOAGULATION CONSULT NOTE - Initial Consult  Pharmacy Consult for warfarin Indication: atrial fibrillation, history of CVA  Allergies  Allergen Reactions   Sulfa Antibiotics Other (See Comments)    Granulocytosis   Lisinopril Cough    Patient Measurements: Height: 6' (182.9 cm) Weight: 81.6 kg (180 lb) IBW/kg (Calculated) : 77.6  Vital Signs: Temp: 97.7 F (36.5 C) (09/12 1600) Temp Source: Oral (09/12 1046) BP: 143/82 (09/12 1630) Pulse Rate: 80 (09/12 1630)  Labs: Recent Labs    09/19/21 1047  LABPROT 15.2  INR 1.2    Estimated Creatinine Clearance: 58.6 mL/min (by C-G formula based on SCr of 1.03 mg/dL).   Medical History: Past Medical History:  Diagnosis Date   Acquired dilation of ascending aorta and aortic root ([place])    28m aortic root and 469mascending aorta on echo 03/2020   Arthritis    Bladder stones    Borderline diabetes    BPH (benign prostatic hyperplasia)    CAD (coronary artery disease) 12/19/2020   Non-obstructive coronary artery disease at cath in 2019   Coronary artery disease    cardiologist-  dr skMarlou PorchloCecille Rubinerhart NP--- per cath 06-02-2010 non-obstructive cad pLAD 30-40%   Diverticulosis of colon    Dysrhythmia    afib   Gout    Heart murmur    History of adenomatous polyp of colon    2002-- tubular adenoma   History of aortic insufficiency    severe -- s/p  AVR 08-03-2010   History of small bowel obstruction    02/ 2007 mechanical sbo s/p  surgical intervention;  partial sbo 09/ 2011 and 03-20-2011 resolved without surgical intervention   History of urinary retention    HTN (hypertension)    Peripheral neuropathy    Persistent atrial fibrillation (HCC)    S/P aortic valve replacement with prosthetic valve 08/03/2010   tissue valve   S/P Maze operation for atrial fibrillation 01/30/2018   Complete bilateral atrial lesion set using bipolar radiofrequency and cryothermy with clipping of LA appendage   S/P MVR (mitral valve  repair) 01/30/2018   Complex valvuloplasty including artificial Gore-tex neochord placement x4 and Carbo medics Annuloflex ring annuloplasty, size 28   S/P patent foramen ovale closure 08/03/2010   at same time AVR   S/P tricuspid valve repair 01/30/2018   Using an MC3 Annuloplasty ring, size 28   Stroke ([place]   Tricuspid regurgitation     Medications: Warfarin PTA [facility]: -INR goal: 2-3 (INR was 2.5 at clinic appointment) -Home dose: Warfarin 7.5 mg on Monday, 5 mg all other days of the week  Assessment: Pt is an [id number] who underwent a left total knee replacement today. Pt has PMH significant for atrial fibrillation, CVA, mitral valve repair, and bioprosthetic aortic valve replacement. He is chronically anticoagulated with warfarin - pharmacy consulted to dose while inpatient.   [facility] details recommendations for peri-operative anticoagulation: -Warfarin held from 9/7 - 9/11. Enoxaparin 80 mg subQ q12h bridge -Resume warfarin POD#0 with boosted dose on 9/12 and 9/13 -Resume therapeutic enoxaparin bridge on POD#1  Today, 09/19/21 INR = 1.2 is subtherapeutic as expected after holding warfarin for surgery CBC: Hgb (12.4) and Plt (133) were slightly low on 9/6 SCr = 1.03 (on 9/6). CrCl ~55 mL/min  Discussed with ortho PA - will follow recommendations of anticoagulation clinic as below.  Goal of Therapy:  INR 2-3 Monitor platelets by anticoagulation  protocol: Yes   Plan:  Warfarin 7.5 mg PO once this evening Therapeutic enoxaparin bridge 1 mg/kg (80 mg) subQ q12h starting tomorrow (POD#1) at 10:00 AM INR daily. CBC with AM labs tomorrow.   Lenis Noon, PharmD 09/19/2021,4:35 PM

## 2021-09-19 NOTE — Evaluation (Signed)
Physical Therapy Evaluation Patient Details Name: Cylus Douville. MRN: [id number] DOB: Jul 03, 1935 [name] Date: 09/19/2021  History of Present Illness  Pt is an 86yo male presenting s/p L-TKA on 09/19/21. PMH: CAD s/p cath, gout, HTN, peripheral neuropathy, PAF s/p Maze, s/p aortic valve replacement, s/p tricuspid valve repair, hx of stroke, L-THA 2022.   Clinical Impression  Cormick Moss. is a 86 y.o. male POD 0 s/p L-TKA. Patient reports independence with mobility at baseline. Patient is now limited by functional impairments (see PT problem list below) and requires supervision for bed mobility and min guard for transfers. Patient was able to ambulate 30 feet with RW and min guard level of assist. Patient instructed in exercise to facilitate ROM and circulation to manage edema. Patient will benefit from continued skilled PT interventions to address impairments and progress towards PLOF. Acute PT will follow to progress mobility and stair training in preparation for safe discharge home.       Recommendations for follow up therapy are one component of a multi-disciplinary discharge planning process, led by the attending physician.  Recommendations may be updated based on patient status, additional functional criteria and insurance authorization.  Follow Up Recommendations Follow physician's recommendations for discharge plan and follow up therapies      Assistance Recommended at Discharge Intermittent Supervision/Assistance  Patient can return home with the following  A little help with walking and/or transfers;A little help with bathing/dressing/bathroom;Assistance with cooking/housework;Help with stairs or ramp for entrance;Assist for transportation    Equipment Recommendations None recommended by PT  Recommendations for Other Services       Functional Status Assessment Patient has had a recent decline in their functional status and demonstrates the ability to make significant  improvements in function in a reasonable and predictable amount of time.     Precautions / Restrictions Precautions Precautions: Fall Restrictions Weight Bearing Restrictions: No Other Position/Activity Restrictions: WBAT      Mobility  Bed Mobility Overal bed mobility: Needs Assistance Bed Mobility: Supine to Sit     Supine to sit: Supervision     General bed mobility comments: for safety only, no physical assist required    Transfers Overall transfer level: Needs assistance Equipment used: Rolling walker (2 wheels) Transfers: Sit to/from Stand Sit to Stand: Min guard, From elevated surface           General transfer comment: Min guard from elevated surface, no physical assist required    Ambulation/Gait Ambulation/Gait assistance: Min guard Gait Distance (Feet): 30 Feet Assistive device: Rolling walker (2 wheels) Gait Pattern/deviations: Step-to pattern Gait velocity: decreased     General Gait Details: Pt ambulated with RW and min guard, no physical assist required or overt LOB noted. Pt required moderate verbal cuing for sequencing of BLE.  Stairs            Wheelchair Mobility    Modified Rankin (Stroke Patients Only)       Balance Overall balance assessment: Needs assistance Sitting-balance support: Feet supported, No upper extremity supported Sitting balance-Leahy Scale: Good     Standing balance support: Reliant on assistive device for balance, During functional activity, Bilateral upper extremity supported Standing balance-Leahy Scale: Poor                               Pertinent Vitals/Pain Pain Assessment Pain Assessment: No/denies pain    Home Living Family/patient expects to be discharged to:: Private residence Living  Arrangements: Spouse/significant other Available Help at Discharge: Family;Available 24 hours/day Type of Home: House Home Access: Stairs to enter Entrance Stairs-Rails: Right Entrance  Stairs-Number of Steps: 3   Home Layout: Two level;Able to live on main level with bedroom/bathroom Home Equipment: Rolling Walker (2 wheels);Cane - single point;Shower seat;BSC/3in1;Grab bars - tub/shower      Prior Function Prior Level of Function : Independent/Modified Independent;Driving             Mobility Comments: ind ADLs Comments: ind     Hand Dominance   Dominant Hand: Right    Extremity/Trunk Assessment   Upper Extremity Assessment Upper Extremity Assessment: Overall WFL for tasks assessed    Lower Extremity Assessment Lower Extremity Assessment: RLE deficits/detail;LLE deficits/detail RLE Deficits / Details: MMT ank DF/PF 5/5, no extensor lag noted (pt performed bilateral SLR upon impulse) RLE Sensation: WNL LLE Deficits / Details: MMT ank DF/PF 5/5, no extensor lag noted (pt performed bilateral SLR upon impulse) LLE Sensation: WNL    Cervical / Trunk Assessment Cervical / Trunk Assessment: Normal  Communication   Communication: No difficulties  Cognition Arousal/Alertness: Awake/alert Behavior During Therapy: WFL for tasks assessed/performed Overall Cognitive Status: Within Functional Limits for tasks assessed                                          General Comments General comments (skin integrity, edema, etc.): Wife Jolayne Haines present    Exercises Total Joint Exercises Ankle Circles/Pumps: AROM, Both, 10 reps   Assessment/Plan    PT Assessment Patient needs continued PT services  PT Problem List Decreased strength;Decreased range of motion;Decreased activity tolerance;Decreased balance;Decreased mobility;Decreased coordination;Pain       PT Treatment Interventions DME instruction;Gait training;Stair training;Functional mobility training;Therapeutic activities;Therapeutic exercise;Balance training;Neuromuscular re-education;Patient/family education    PT Goals (Current goals can be found in the Care Plan section)  Acute Rehab  PT Goals Patient Stated Goal: Walk without pain PT Goal Formulation: With patient Time For Goal Achievement: 09/26/21 Potential to Achieve Goals: Good    Frequency 7X/week     Co-evaluation               AM-PAC PT "6 Clicks" Mobility  Outcome Measure Help needed turning from your back to your side while in a flat bed without using bedrails?: A Little Help needed moving from lying on your back to sitting on the side of a flat bed without using bedrails?: A Little Help needed moving to and from a bed to a chair (including a wheelchair)?: A Little Help needed standing up from a chair using your arms (e.g., wheelchair or bedside chair)?: A Little Help needed to walk in hospital room?: A Little Help needed climbing 3-5 steps with a railing? : A Little 6 Click Score: 18    End of Session Equipment Utilized During Treatment: Gait belt Activity Tolerance: Patient tolerated treatment well;No increased pain Patient left: in chair;with call bell/phone within reach;with chair alarm set;with family/visitor present;with SCD's reapplied Nurse Communication: Mobility status PT Visit Diagnosis: Pain;Difficulty in walking, not elsewhere classified (R26.2) Pain - Right/Left: Left Pain - part of body: Knee    Time: 8527-7824 PT Time Calculation (min) (ACUTE ONLY): 35 min   Charges:   PT Evaluation $PT Eval Low Complexity: 1 Low PT Treatments $Gait Training: 8-22 mins        Coolidge Breeze, PT, DPT [facility] Office: [phone]  Ransom Nickson 09/19/2021, 6:34 PM

## 2021-09-19 NOTE — Anesthesia Procedure Notes (Signed)
Anesthesia Regional Block: Adductor canal block   Pre-Anesthetic Checklist: , timeout performed,  Correct Patient, Correct Site, Correct Laterality,  Correct Procedure, Correct Position, site marked,  Risks and benefits discussed,  Surgical consent,  Pre-op evaluation,  At surgeon's request and post-op pain management  Laterality: Left  Prep: chloraprep       Needles:  Injection technique: Single-shot  Needle Type: Echogenic Needle     Needle Length: 9cm  Needle Gauge: 21     Additional Needles:   Procedures:,,,, ultrasound used (permanent image in chart),,    Narrative:  Start time: 09/19/2021 12:03 PM End time: 09/19/2021 12:08 PM Injection made incrementally with aspirations every 5 mL.  Performed by: Personally  Anesthesiologist: Suzette Battiest, MD

## 2021-09-19 NOTE — Care Plan (Signed)
Ortho Bundle Case Management Note  Patient Details  Name: [name]. MRN: [id number] Date of Birth: August 18, 1935  L TKA on 09-19-21 DCP:  Home with wife DME:  No needs, has a RW PT:  [facility] on 09-22-21                   DME Arranged:  N/A DME Agency:  NA  HH Arranged:  NA HH Agency:  NA  Additional Comments: Please contact me with any questions of if this plan should need to change.  Marianne Sofia, RN,CCM [facility]  [phone] 09/19/2021, 3:12 PM

## 2021-09-19 NOTE — Interval H&P Note (Signed)
History and Physical Interval Note:  09/19/2021 11:18 AM  [name].  has presented today for surgery, with the diagnosis of Left knee osteoarthritis.  The various methods of treatment have been discussed with the patient and family. After consideration of risks, benefits and other options for treatment, the patient has consented to  Procedure(s): TOTAL KNEE ARTHROPLASTY (Left) as a surgical intervention.  The patient's history has been reviewed, patient examined, no change in status, stable for surgery.  I have reviewed the patient's chart and labs.  Questions were answered to the patient's satisfaction.     [name]

## 2021-09-19 NOTE — Anesthesia Postprocedure Evaluation (Signed)
Anesthesia Post Note  Patient: [name].  Procedure(s) Performed: TOTAL KNEE ARTHROPLASTY (Left: Knee)     Patient location during evaluation: PACU Anesthesia Type: Spinal Level of consciousness: awake and alert Pain management: pain level controlled Vital Signs Assessment: post-procedure vital signs reviewed and stable Respiratory status: spontaneous breathing and respiratory function stable Cardiovascular status: blood pressure returned to baseline and stable Postop Assessment: spinal receding Anesthetic complications: no   No notable events documented.  Last Vitals:  Vitals:   09/19/21 1600 09/19/21 1615  BP: 138/80 136/73  Pulse: 74 77  Resp: 18 15  Temp: 36.5 C   SpO2: 100% 100%    Last Pain:  Vitals:   09/19/21 1600  TempSrc:   PainSc: 0-No pain                 Tiajuana Amass

## 2021-09-19 NOTE — Anesthesia Procedure Notes (Signed)
Spinal  Patient location during procedure: OR Start time: 09/19/2021 1:22 PM End time: 09/19/2021 1:27 PM Reason for block: surgical anesthesia Staffing Performed: anesthesiologist  Anesthesiologist: Suzette Battiest, MD Performed by: Suzette Battiest, MD Authorized by: Suzette Battiest, MD   Preanesthetic Checklist Completed: patient identified, IV checked, site marked, risks and benefits discussed, surgical consent, monitors and equipment checked, pre-op evaluation and timeout performed Spinal Block Patient position: sitting Prep: DuraPrep Patient monitoring: heart rate, cardiac monitor, continuous pulse ox and blood pressure Approach: midline Location: L4-5 Injection technique: single-shot Needle Needle type: Pencan  Needle gauge: 24 G Needle length: 9 cm Assessment Sensory level: T4 Events: CSF return

## 2021-09-19 NOTE — Op Note (Signed)
NAME:  [name].                      MEDICAL RECORD NO.:  [id number]                             FACILITY:  [facility]      PHYSICIAN:  Pietro Cassis. Alvan Dame, M.D.  DATE OF BIRTH:  11-08-1935      DATE OF PROCEDURE:  09/19/2021                                     OPERATIVE REPORT         PREOPERATIVE DIAGNOSIS:  Left knee osteoarthritis.      POSTOPERATIVE DIAGNOSIS:  Left knee osteoarthritis.      FINDINGS:  The patient was noted to have complete loss of cartilage and   bone-on-bone arthritis with associated osteophytes in the medial and patellofemoral compartments of   the knee with a significant synovitis and associated effusion.  The patient had failed months of conservative treatment including medications, injection therapy, activity modification.     PROCEDURE:  Left total knee replacement.      COMPONENTS USED:  DePuy Attune rotating platform posterior stabilized knee   system, a size 7 femur, 6 tibia, size 6 mm PS AOX insert, and 38 anatomic patellar   button.      SURGEON:  Pietro Cassis. Alvan Dame, M.D.      ASSISTANT:  Costella Hatcher, PA-C.      ANESTHESIA:  Regional and Spinal.      SPECIMENS:  None.      COMPLICATION:  None.      DRAINS:  None.  EBL: <150 cc      TOURNIQUET TIME:  30 min at 225 mmHg     The patient was stable to the recovery room.      INDICATION FOR PROCEDURE:  [name]. is a 86 y.o. male patient of   mine.  The patient had been seen, evaluated, and treated for months conservatively in the   office with medication, activity modification, and injections.  The patient had   radiographic changes of bone-on-bone arthritis with endplate sclerosis and osteophytes noted.  Based on the radiographic changes and failed conservative measures, the patient   decided to proceed with definitive treatment, total knee replacement.  Risks of infection, DVT, component failure, need for revision surgery, neurovascular injury were reviewed in the office  setting.  The postop course was reviewed stressing the efforts to maximize post-operative satisfaction and function.  Consent was obtained for benefit of pain   relief.      PROCEDURE IN DETAIL:  The patient was brought to the operative theater.   Once adequate anesthesia, preoperative antibiotics, 2 gm of Ancef,1 gm of Tranexamic Acid, and 10 mg of Decadron administered, the patient was positioned supine with a left thigh tourniquet placed.  The  left lower extremity was prepped and draped in sterile fashion.  A time-   out was performed identifying the patient, planned procedure, and the appropriate extremity.      The left lower extremity was placed in the [facility] leg holder.  The leg was   exsanguinated, tourniquet elevated to 250 mmHg.  A midline incision was   made followed by median parapatellar arthrotomy.  Following initial   exposure,  attention was first directed to the patella.  Precut   measurement was noted to be 24 mm.  I resected down to 14 mm and used a   38 anatomic patellar button to restore patellar height as well as cover the cut surface.      The lug holes were drilled and a metal shim was placed to protect the   patella from retractors and saw blade during the procedure.      At this point, attention was now directed to the femur.  The femoral   canal was opened with a drill, irrigated to try to prevent fat emboli.  An   intramedullary rod was passed at 5 degrees valgus, 9 mm of bone was   resected off the distal femur.  Following this resection, the tibia was   subluxated anteriorly.  Using the extramedullary guide, 2 mm of bone was resected off   the proximal medial tibia.  We confirmed the gap would be   stable medially and laterally with a size 5 spacer block as well as confirmed that the tibial cut was perpendicular in the coronal plane, checking with an alignment rod.      Once this was done, I sized the femur to be a size 7 in the anterior-   posterior  dimension, chose a standard component based on medial and   lateral dimension.  The size 7 rotation block was then pinned in   position anterior referenced using the C-clamp to set rotation.  The   anterior, posterior, and  chamfer cuts were made without difficulty nor   notching making certain that I was along the anterior cortex to help   with flexion gap stability.      The final box cut was made off the lateral aspect of distal femur.      At this point, the tibia was sized to be a size 6.  The size 6 tray was   then pinned in position through the medial third of the tubercle,   drilled, and keel punched.  Trial reduction was now carried with a 7 femur,  6 tibia, a size 6 mm PS insert, and the 38 anatomic patella botton.  The knee was brought to full extension with good flexion stability with the patella   tracking through the trochlea without application of pressure.  Given   all these findings the trial components removed.  Final components were   opened and cement was mixed.  The knee was irrigated with normal saline solution and pulse lavage.  The synovial lining was   then injected with 30 cc of 0.25% Marcaine with epinephrine, 1 cc of Toradol and 30 cc of NS for a total of 61 cc.     Final implants were then cemented onto cleaned and dried cut surfaces of bone with the knee brought to extension with a size 6 mm PS trial insert.      Once the cement had fully cured, excess cement was removed   throughout the knee.  I confirmed that I was satisfied with the range of   motion and stability, and the final size 6 mm PS AOX insert was chosen.  It was   placed into the knee.      The tourniquet had been let down at 30 minutes.  No significant   hemostasis was required.  The extensor mechanism was then reapproximated using #1 Vicryl and #1 Stratafix sutures with the knee   in flexion.  The   remaining wound was closed with 2-0 Vicryl and running 4-0 Monocryl.   The knee was cleaned,  dried, dressed sterilely using Dermabond and   Aquacel dressing.  The patient was then   brought to recovery room in stable condition, tolerating the procedure   well.   Please note that Physician Assistant, [employer], PA-C was present for the entirety of the case, and was utilized for pre-operative positioning, peri-operative retractor management, general facilitation of the procedure and for primary wound closure at the end of the case.              Pietro Cassis Alvan Dame, M.D.    09/19/2021 11:19 AM

## 2021-09-19 NOTE — Transfer of Care (Signed)
Immediate Anesthesia Transfer of Care Note  Patient: Nimesh Riolo.  Procedure(s) Performed: TOTAL KNEE ARTHROPLASTY (Left: Knee)  Patient Location: PACU  Anesthesia Type:Spinal  Level of Consciousness: drowsy  Airway & Oxygen Therapy: Patient Spontanous Breathing and Patient connected to face mask oxygen  Post-op Assessment: Report given to RN and Post -op Vital signs reviewed and stable  Post vital signs: Reviewed and stable  Last Vitals:  Vitals Value Taken Time  BP    Temp    Pulse 78 09/19/21 1505  Resp 15 09/19/21 1505  SpO2 100 % 09/19/21 1505  Vitals shown include unvalidated device data.  Last Pain:  Vitals:   09/19/21 1205  TempSrc:   PainSc: 0-No pain      Patients Stated Pain Goal: 5 (01/11/02 5913)  Complications: No notable events documented.

## 2021-09-19 NOTE — Discharge Instructions (Signed)

## 2021-09-20 ENCOUNTER — Encounter (HOSPITAL_COMMUNITY): Payer: Self-pay | Admitting: Orthopedic Surgery

## 2021-09-20 DIAGNOSIS — I10 Essential (primary) hypertension: Secondary | ICD-10-CM | POA: Diagnosis not present

## 2021-09-20 DIAGNOSIS — Z7901 Long term (current) use of anticoagulants: Secondary | ICD-10-CM | POA: Diagnosis not present

## 2021-09-20 DIAGNOSIS — Z79899 Other long term (current) drug therapy: Secondary | ICD-10-CM | POA: Diagnosis not present

## 2021-09-20 DIAGNOSIS — I2510 Atherosclerotic heart disease of native coronary artery without angina pectoris: Secondary | ICD-10-CM | POA: Diagnosis not present

## 2021-09-20 DIAGNOSIS — I4819 Other persistent atrial fibrillation: Secondary | ICD-10-CM | POA: Diagnosis not present

## 2021-09-20 DIAGNOSIS — Z96642 Presence of left artificial hip joint: Secondary | ICD-10-CM | POA: Diagnosis not present

## 2021-09-20 DIAGNOSIS — Z85828 Personal history of other malignant neoplasm of skin: Secondary | ICD-10-CM | POA: Diagnosis not present

## 2021-09-20 DIAGNOSIS — Z8673 Personal history of transient ischemic attack (TIA), and cerebral infarction without residual deficits: Secondary | ICD-10-CM | POA: Diagnosis not present

## 2021-09-20 DIAGNOSIS — M1712 Unilateral primary osteoarthritis, left knee: Secondary | ICD-10-CM | POA: Diagnosis not present

## 2021-09-20 LAB — CBC
HCT: 29.4 % — ABNORMAL LOW (ref 39.0–52.0)
Hemoglobin: 9.8 g/dL — ABNORMAL LOW (ref 13.0–17.0)
MCH: 32.3 pg (ref 26.0–34.0)
MCHC: 33.3 g/dL (ref 30.0–36.0)
MCV: 97 fL (ref 80.0–100.0)
Platelets: 101 10*3/uL — ABNORMAL LOW (ref 150–400)
RBC: 3.03 MIL/uL — ABNORMAL LOW (ref 4.22–5.81)
RDW: 13.6 % (ref 11.5–15.5)
WBC: 10.1 10*3/uL (ref 4.0–10.5)
nRBC: 0 % (ref 0.0–0.2)

## 2021-09-20 LAB — BASIC METABOLIC PANEL
Anion gap: 7 (ref 5–15)
BUN: 13 mg/dL (ref 8–23)
CO2: 22 mmol/L (ref 22–32)
Calcium: 8.4 mg/dL — ABNORMAL LOW (ref 8.9–10.3)
Chloride: 109 mmol/L (ref 98–111)
Creatinine, Ser: 0.89 mg/dL (ref 0.61–1.24)
GFR, Estimated: >60 mL/min (ref >60)
Glucose, Bld: 199 mg/dL — ABNORMAL HIGH (ref 70–99)
Potassium: 3.9 mmol/L (ref 3.5–5.1)
Sodium: 138 mmol/L (ref 135–145)

## 2021-09-20 LAB — PROTIME-INR
INR: 1.3 — ABNORMAL HIGH (ref 0.8–1.2)
Prothrombin Time: 16.1 seconds — ABNORMAL HIGH (ref 11.4–15.2)

## 2021-09-20 MED ORDER — METHOCARBAMOL 500 MG PO TABS: 500.0000 mg | ORAL_TABLET | Freq: Four times a day (QID) | ORAL | 2 refills | Status: DC | PRN

## 2021-09-20 MED ORDER — POLYETHYLENE GLYCOL 3350 17 G PO PACK: 17.0000 g | PACK | Freq: Every day | ORAL | 0 refills | Status: DC | PRN

## 2021-09-20 MED ORDER — DOCUSATE SODIUM 100 MG PO CAPS: 100.0000 mg | ORAL_CAPSULE | Freq: Two times a day (BID) | ORAL | 0 refills | Status: DC

## 2021-09-20 MED ORDER — OXYCODONE HCL 5 MG PO TABS: 5.0000 mg | ORAL_TABLET | ORAL | 0 refills | Status: DC | PRN

## 2021-09-20 MED ORDER — ACETAMINOPHEN 500 MG PO TABS: 1000.0000 mg | ORAL_TABLET | Freq: Four times a day (QID) | ORAL | 0 refills | Status: DC

## 2021-09-20 MED ORDER — WARFARIN SODIUM 5 MG PO TABS
7.5000 mg | ORAL_TABLET | Freq: Once | ORAL | Status: AC
  Administered 2021-09-20: 7.5 mg via ORAL
  Filled 2021-09-20: qty 1

## 2021-09-20 NOTE — TOC Initial Note (Signed)
Transition of Care (TOC) - Initial/Assessment Note    Patient Details  Name: [name]. MRN: [id number] Date of Birth: Aug 17, 1935  Transition of Care (TOC) CM/SW Contact:    Joaquin Courts, RN Phone Number: 09/20/2021, 10:09 AM  Clinical Narrative:                 Patient plans to discharge home with OPPT, has RW at home. No further TOC needs identified.  Expected Discharge Plan: Home/Self Care Barriers to Discharge: No Barriers Identified   Patient Goals and CMS Choice Patient states their goals for this hospitalization and ongoing recovery are:: to go home with outpatient therapy      Expected Discharge Plan and Services Expected Discharge Plan: Home/Self Care   Discharge Planning Services: CM Consult     Expected Discharge Date: 09/20/21               DME Arranged: N/A DME Agency: NA       HH Arranged: NA [place] Agency: NA        Prior Living Arrangements/Services     Patient language and need for interpreter reviewed:: Yes Do you feel safe going back to the place where you live?: Yes      Need for Family Participation in Patient Care: Yes (Comment) Care giver support system in place?: Yes (comment)   Criminal Activity/Legal Involvement Pertinent to Current Situation/Hospitalization: No - Comment as needed  Activities of Daily Living Home Assistive Devices/Equipment: Gilford Rile (specify type) ADL Screening (condition at time of admission) Patient's cognitive ability adequate to safely complete daily activities?: Yes Is the patient deaf or have difficulty hearing?: No Does the patient have difficulty seeing, even when wearing glasses/contacts?: No Does the patient have difficulty concentrating, remembering, or making decisions?: No Patient able to express need for assistance with ADLs?: Yes Does the patient have difficulty dressing or bathing?: No Independently performs ADLs?: Yes (appropriate for developmental age) Does the patient have  difficulty walking or climbing stairs?: No Weakness of Legs: None Weakness of Arms/Hands: None  Permission Sought/Granted                  Emotional Assessment Appearance:: Appears stated age Attitude/Demeanor/Rapport: Engaged Affect (typically observed): Accepting Orientation: : Oriented to Self, Oriented to Place, Oriented to  Time, Oriented to Situation   Psych Involvement: No (comment)  Admission diagnosis:  S/P total knee arthroplasty, left [Z96.652] Patient Active Problem List   Diagnosis Date Noted   S/P total knee arthroplasty, left 09/19/2021   Abnormal findings on diagnostic imaging of other specified body structures 06/02/2021   Hypoalbuminemia due to protein-calorie malnutrition ([place]) 05/29/2021   Valvular heart disease 05/28/2021   Acute CVA (cerebrovascular accident) ([place]) 05/21/2021   SIRS (systemic inflammatory response syndrome) ([place]) 05/21/2021   Generalized weakness 05/21/2021   Otitis externa 05/21/2021   CAD (coronary artery disease) 12/19/2020   Malnutrition of moderate degree 08/02/2020   S/P left total hip arthroplasty 08/02/2020   Closed left hip fracture, initial encounter ([place]) 08/01/2020   Chronic congestive heart failure ([place]) 05/26/2020   Elevated PSA 05/26/2020   History of adenomatous polyp of colon 05/26/2020   History of anemia 05/26/2020   Hypercoagulable state ([place]) 05/26/2020   Impairment of balance 05/26/2020   Peripheral neuropathy 05/26/2020   Personal history of other malignant neoplasm of skin 05/26/2020   Prediabetes 05/26/2020   Presence of prosthetic heart valve 05/26/2020   Sleep disorder 05/26/2020   Thrombocytopenia ([place]) 05/26/2020   Vitamin  D deficiency 05/26/2020   Acquired dilation of ascending aorta and aortic root (HCC)    Anemia 09/05/2018   Anemia due to chronic blood loss 09/05/2018   Long term current use of anticoagulant 02/10/2018   s/p mitral valve repair 01/30/2018   S/P tricuspid valve repair  01/30/2018   S/P Maze operation for atrial fibrillation 01/30/2018   Tricuspid regurgitation    Mitral valve regurgitation 10/14/2017   History of cerebrovascular accident 11/27/2016   Atrial fibrillation ([place])    Hyperglycemia    Right middle cerebral artery stroke ([place]) 85/27/7824   Acute embolic stroke (HCC)    Hypokalemia    Left hemiparesis ([place])    Dysphagia, post-stroke    S/P AVR (aortic valve replacement)    Benign prostatic hyperplasia with lower urinary tract symptoms    CVA (cerebral vascular accident) ([place]) 10/24/2016   Essential hypertension    Gout    History of small bowel obstruction    History of BPH    S/P AVR 08/03/2010   Hypertrophy of prostate with urinary obstruction and other lower urinary tract symptoms (LUTS) 05/21/2001   PCP:  Marda Stalker, PA-C Pharmacy:   [place], [place] - [address] [place] [place] [address] Phone: [phone] Fax: [phone]  [facility] ([place], [place] [facility] [address] [place] [address] [place] [address] Phone: [phone] Fax: [phone]  [facility] ([facility]) - [place], [address] [place] [address] Phone: [phone] Fax: [phone]     Social Determinants of Health (SDOH) Interventions    Readmission Risk Interventions     No data to display

## 2021-09-20 NOTE — Progress Notes (Addendum)
Subjective: 1 Day Post-Op Procedure(s) (LRB): TOTAL KNEE ARTHROPLASTY (Left) Patient reports pain as mild.   Patient seen in rounds with Dr. Alvan Dame. Patient is well, and has had no acute complaints or problems. No acute events overnight. Patient ambulated 30 feet with PT.  We will continue therapy today.   Objective: Vital signs in last 24 hours: Temp:  [97.6 F (36.4 C)-98.8 F (37.1 C)] 97.8 F (36.6 C) (09/13 0543) Pulse Rate:  [74-94] 85 (09/13 0543) Resp:  [13-19] 17 (09/13 0543) BP: (124-158)/(64-89) 136/76 (09/13 0543) SpO2:  [94 %-100 %] 96 % (09/13 0543) Weight:  [81.6 kg] 81.6 kg (09/12 1025)  Intake/Output from previous day:  Intake/Output Summary (Last 24 hours) at 09/20/2021 0715 Last data filed at 09/20/2021 0600 Gross per 24 hour  Intake 3006.25 ml  Output 2195 ml  Net 811.25 ml     Intake/Output this shift: No intake/output data recorded.  Labs: Recent Labs    09/20/21 0326  HGB 9.8*   Recent Labs    09/20/21 0326  WBC 10.1  RBC 3.03*  HCT 29.4*  PLT 101*   Recent Labs    09/20/21 0326  NA 138  K 3.9  CL 109  CO2 22  BUN 13  CREATININE 0.89  GLUCOSE 199*  CALCIUM 8.4*   Recent Labs    09/19/21 1047 09/20/21 0326  INR 1.2 1.3*    Exam: General - Patient is Alert and Oriented Extremity - Neurologically intact Sensation intact distally Intact pulses distally Dorsiflexion/Plantar flexion intact Dressing - dressing C/D/I Motor Function - intact, moving foot and toes well on exam.   Past Medical History:  Diagnosis Date   Acquired dilation of ascending aorta and aortic root (HCC)    38m aortic root and 478mascending aorta on echo 03/2020   Arthritis    Bladder stones    Borderline diabetes    BPH (benign prostatic hyperplasia)    CAD (coronary artery disease) 12/19/2020   Non-obstructive coronary artery disease at cath in 2019   Coronary artery disease    cardiologist-  dr skMarlou PorchloCecille Rubinerhart NP--- per cath 06-02-2010  non-obstructive cad pLAD 30-40%   Diverticulosis of colon    Dysrhythmia    afib   Gout    Heart murmur    History of adenomatous polyp of colon    2002-- tubular adenoma   History of aortic insufficiency    severe -- s/p  AVR 08-03-2010   History of small bowel obstruction    02/ 2007 mechanical sbo s/p  surgical intervention;  partial sbo 09/ 2011 and 03-20-2011 resolved without surgical intervention   History of urinary retention    HTN (hypertension)    Peripheral neuropathy    Persistent atrial fibrillation (HCC)    S/P aortic valve replacement with prosthetic valve 08/03/2010   tissue valve   S/P Maze operation for atrial fibrillation 01/30/2018   Complete bilateral atrial lesion set using bipolar radiofrequency and cryothermy with clipping of LA appendage   S/P MVR (mitral valve repair) 01/30/2018   Complex valvuloplasty including artificial Gore-tex neochord placement x4 and Carbo medics Annuloflex ring annuloplasty, size 28   S/P patent foramen ovale closure 08/03/2010   at same time AVR   S/P tricuspid valve repair 01/30/2018   Using an MC3 Annuloplasty ring, size 28   Stroke (HCC)    Tricuspid regurgitation     Assessment/Plan: 1 Day Post-Op Procedure(s) (LRB): TOTAL KNEE ARTHROPLASTY (Left) Principal Problem:   S/P  total knee arthroplasty, left  Estimated body mass index is 24.41 kg/m as calculated from the following:   Height as of this encounter: 6' (1.829 m).   Weight as of this encounter: 81.6 kg. Advance diet Up with therapy D/C IV fluids   Patient's anticipated LOS is less than 2 midnights, meeting these requirements: - Younger than 8 - Lives within 1 hour of care - Has a competent adult at home to recover with post-op recover - NO history of  - Chronic pain requiring opiods  - Diabetes  - Coronary Artery Disease  - Heart failure  - Heart attack  - Stroke  - DVT/VTE  - Cardiac arrhythmia  - Respiratory Failure/COPD  - Renal failure  -  Anemia  - Advanced Liver disease     DVT Prophylaxis - Coumadin Weight bearing as tolerated.  Hgb stable at 9.8 this AM. I inserted the instructions for lovenox per Cardio into the d/c instructions.  Plan is to go Home after hospital stay. Plan for discharge today following 1-2 sessions of PT as long as they are meeting their goals. Patient is scheduled for OPPT. Follow up in the office in 2 weeks.   Gerald Citron, PA-C Orthopedic Surgery [phone] 09/20/2021, 7:15 AM

## 2021-09-20 NOTE — Progress Notes (Signed)
Physical Therapy Treatment Patient Details Name: [name]. MRN: [id number] DOB: 1935-11-22 [name] Date: 09/20/2021   History of Present Illness Pt is an 86yo male presenting s/p L-TKA on 09/19/21. PMH: CAD s/p cath, gout, HTN, peripheral neuropathy, PAF s/p Maze, s/p aortic valve replacement, s/p tricuspid valve repair, hx of stroke, L-THA 2022.    PT Comments    Pt seen POD1, supine in bed upon entry reporting no pain. Required supervision for bed mobility, min guard for transfers, and min guard for ambulation in hallway [id number] with seated rest break between bouts. Provided HEP and pt completed with minimal cuing and safe form. All education completed and pt has no further questions. Pt has met mobility goals for safe discharge home in care of wife, PT is signing off, should needs change please re-consult, thank you for this referral.     Recommendations for follow up therapy are one component of a multi-disciplinary discharge planning process, led by the attending physician.  Recommendations may be updated based on patient status, additional functional criteria and insurance authorization.  Follow Up Recommendations  Follow physician's recommendations for discharge plan and follow up therapies     Assistance Recommended at Discharge Intermittent Supervision/Assistance  Patient can return home with the following A little help with walking and/or transfers;A little help with bathing/dressing/bathroom;Assistance with cooking/housework;Help with stairs or ramp for entrance;Assist for transportation   Equipment Recommendations  None recommended by PT    Recommendations for Other Services       Precautions / Restrictions Precautions Precautions: Fall Restrictions Weight Bearing Restrictions: No Other Position/Activity Restrictions: WBAT     Mobility  Bed Mobility Overal bed mobility: Needs Assistance Bed Mobility: Supine to Sit     Supine to sit: Supervision      General bed mobility comments: for safety only, no physical assist required    Transfers Overall transfer level: Needs assistance Equipment used: Rolling walker (2 wheels) Transfers: Sit to/from Stand Sit to Stand: Min guard           General transfer comment: Min guard from elevated surface, no physical assist required, VCs for using BUE to power up off bed/armrest    Ambulation/Gait Ambulation/Gait assistance: Min guard Gait Distance (Feet): 500 Feet Assistive device: Rolling walker (2 wheels) Gait Pattern/deviations: Step-to pattern, Antalgic Gait velocity: decreased     General Gait Details: Pt ambulated with RW and min guard, no physical assist required or overt LOB noted, first bout 2064f seated rest with performance of HEP, then 30045fPt required moderate verbal cuing for sequencing of BLE, mildly antalgic gait.   Stairs Stairs: Yes Stairs assistance: Min guard Stair Management: One rail Right, Step to pattern, Forwards Number of Stairs: 2 General stair comments: Pt demonstrated safe stair mobility, no overt LOB, min guard.   Wheelchair Mobility    Modified Rankin (Stroke Patients Only)       Balance Overall balance assessment: Needs assistance Sitting-balance support: Feet supported, No upper extremity supported Sitting balance-Leahy Scale: Good     Standing balance support: Reliant on assistive device for balance, During functional activity, Bilateral upper extremity supported Standing balance-Leahy Scale: Poor                              Cognition Arousal/Alertness: Awake/alert Behavior During Therapy: WFL for tasks assessed/performed Overall Cognitive Status: Within Functional Limits for tasks assessed  Exercises Total Joint Exercises Ankle Circles/Pumps: AROM, Both, 20 reps Quad Sets: AROM, Left, 10 reps Short Arc Quad: AROM, Left, 10 reps Heel Slides: AROM, Left, 10  reps Hip ABduction/ADduction: AROM, Left, 10 reps Straight Leg Raises: AROM, Left, 10 reps Goniometric ROM: -5-90 by visual approximation    General Comments        Pertinent Vitals/Pain Pain Assessment Pain Assessment: No/denies pain    Home Living                          Prior Function            PT Goals (current goals can now be found in the care plan section) Acute Rehab PT Goals Patient Stated Goal: Walk without pain PT Goal Formulation: With patient Time For Goal Achievement: 09/26/21 Potential to Achieve Goals: Good Progress towards PT goals: Progressing toward goals    Frequency    7X/week      PT Plan Current plan remains appropriate    Co-evaluation              AM-PAC PT "6 Clicks" Mobility   Outcome Measure  Help needed turning from your back to your side while in a flat bed without using bedrails?: A Little Help needed moving from lying on your back to sitting on the side of a flat bed without using bedrails?: A Little Help needed moving to and from a bed to a chair (including a wheelchair)?: A Little Help needed standing up from a chair using your arms (e.g., wheelchair or bedside chair)?: A Little Help needed to walk in hospital room?: A Little Help needed climbing 3-5 steps with a railing? : A Little 6 Click Score: 18    End of Session Equipment Utilized During Treatment: Gait belt Activity Tolerance: Patient tolerated treatment well;No increased pain Patient left: in bed;with bed alarm set;with call bell/phone within reach Nurse Communication: Mobility status PT Visit Diagnosis: Pain;Difficulty in walking, not elsewhere classified (R26.2) Pain - Right/Left: Left Pain - part of body: Knee     Time: 3893-7342 PT Time Calculation (min) (ACUTE ONLY): 28 min  Charges:  $Gait Training: 8-22 mins $Therapeutic Exercise: 8-22 mins                     Coolidge Breeze, PT, DPT [facility] Department Office:  [phone]   [name] 09/20/2021, 9:47 AM

## 2021-09-20 NOTE — Progress Notes (Signed)
ANTICOAGULATION CONSULT NOTE - Follow Up Consult  Pharmacy Consult for Coumadin Indication: atrial fibrillation, h/o CVA  Allergies  Allergen Reactions   Sulfa Antibiotics Other (See Comments)    Granulocytosis   Lisinopril Cough    Patient Measurements: Height: 6' (182.9 cm) Weight: 81.6 kg (180 lb) IBW/kg (Calculated) : 77.6  Vital Signs: Temp: 97.8 F (36.6 C) (09/13 0543) Temp Source: Oral (09/13 0543) BP: 136/76 (09/13 0543) Pulse Rate: 85 (09/13 0543)  Labs: Recent Labs    09/19/21 1047 09/20/21 0326  HGB  --  9.8*  HCT  --  29.4*  PLT  --  101*  LABPROT 15.2 16.1*  INR 1.2 1.3*  CREATININE  --  0.89    Estimated Creatinine Clearance: 67.8 mL/min (by C-G formula based on SCr of 0.89 mg/dL).   Assessment: AC/Heme: Coumadin PTA for afib and h/o CVA, Lovenox bridging until INR 1-2. INR 1.3 today post-op TKA on 9/13. Hgb 12.4>9.8. Plts down to 101. - PTA dose Coumadin 7.'5mg'$  on Mon and '5mg'$  all other days.  Goal of Therapy:  INR 2-3 Monitor platelets by anticoagulation protocol: Yes   Plan:  Appropriate instructions inserted in pt's D/C instructions Give Coumadin 7.'5mg'$  po x 1 per cardiology discharge instructions.   Kenedie Dirocco S. Alford Highland, PharmD, BCPS Clinical Staff Pharmacist [email or web address]  Alford Highland, Jarquavious Fentress Stillinger 09/20/2021,9:01 AM

## 2021-09-22 DIAGNOSIS — M25562 Pain in left knee: Secondary | ICD-10-CM | POA: Diagnosis not present

## 2021-09-23 ENCOUNTER — Inpatient Hospital Stay (HOSPITAL_COMMUNITY)
Admission: EM | Admit: 2021-09-23 | Discharge: 2021-09-26 | DRG: 812 | Disposition: A | Discharge status: Home / Self Care | Payer: Medicare Other | Attending: Internal Medicine | Admitting: Internal Medicine

## 2021-09-23 ENCOUNTER — Other Ambulatory Visit: Payer: Self-pay

## 2021-09-23 ENCOUNTER — Observation Stay (HOSPITAL_COMMUNITY): Payer: Medicare Other

## 2021-09-23 ENCOUNTER — Emergency Department (HOSPITAL_COMMUNITY): Payer: Medicare Other

## 2021-09-23 ENCOUNTER — Encounter (HOSPITAL_COMMUNITY): Payer: Self-pay

## 2021-09-23 DIAGNOSIS — I700 Atherosclerosis of aorta: Secondary | ICD-10-CM | POA: Diagnosis present

## 2021-09-23 DIAGNOSIS — K6389 Other specified diseases of intestine: Secondary | ICD-10-CM | POA: Diagnosis not present

## 2021-09-23 DIAGNOSIS — N281 Cyst of kidney, acquired: Secondary | ICD-10-CM | POA: Diagnosis not present

## 2021-09-23 DIAGNOSIS — Z8673 Personal history of transient ischemic attack (TIA), and cerebral infarction without residual deficits: Secondary | ICD-10-CM

## 2021-09-23 DIAGNOSIS — I2510 Atherosclerotic heart disease of native coronary artery without angina pectoris: Secondary | ICD-10-CM | POA: Diagnosis present

## 2021-09-23 DIAGNOSIS — R339 Retention of urine, unspecified: Principal | ICD-10-CM

## 2021-09-23 DIAGNOSIS — I4891 Unspecified atrial fibrillation: Secondary | ICD-10-CM | POA: Diagnosis present

## 2021-09-23 DIAGNOSIS — I10 Essential (primary) hypertension: Secondary | ICD-10-CM | POA: Diagnosis present

## 2021-09-23 DIAGNOSIS — R338 Other retention of urine: Secondary | ICD-10-CM | POA: Diagnosis not present

## 2021-09-23 DIAGNOSIS — R7303 Prediabetes: Secondary | ICD-10-CM | POA: Diagnosis present

## 2021-09-23 DIAGNOSIS — I4819 Other persistent atrial fibrillation: Secondary | ICD-10-CM | POA: Diagnosis present

## 2021-09-23 DIAGNOSIS — R319 Hematuria, unspecified: Secondary | ICD-10-CM

## 2021-09-23 DIAGNOSIS — R739 Hyperglycemia, unspecified: Secondary | ICD-10-CM | POA: Diagnosis not present

## 2021-09-23 DIAGNOSIS — R310 Gross hematuria: Secondary | ICD-10-CM | POA: Diagnosis not present

## 2021-09-23 DIAGNOSIS — D6959 Other secondary thrombocytopenia: Secondary | ICD-10-CM | POA: Diagnosis not present

## 2021-09-23 DIAGNOSIS — Z808 Family history of malignant neoplasm of other organs or systems: Secondary | ICD-10-CM

## 2021-09-23 DIAGNOSIS — N401 Enlarged prostate with lower urinary tract symptoms: Secondary | ICD-10-CM | POA: Diagnosis present

## 2021-09-23 DIAGNOSIS — E785 Hyperlipidemia, unspecified: Secondary | ICD-10-CM

## 2021-09-23 DIAGNOSIS — Z953 Presence of xenogenic heart valve: Secondary | ICD-10-CM

## 2021-09-23 DIAGNOSIS — Z8249 Family history of ischemic heart disease and other diseases of the circulatory system: Secondary | ICD-10-CM

## 2021-09-23 DIAGNOSIS — Z7901 Long term (current) use of anticoagulants: Secondary | ICD-10-CM | POA: Diagnosis not present

## 2021-09-23 DIAGNOSIS — D649 Anemia, unspecified: Secondary | ICD-10-CM | POA: Diagnosis not present

## 2021-09-23 DIAGNOSIS — I4821 Permanent atrial fibrillation: Secondary | ICD-10-CM | POA: Diagnosis present

## 2021-09-23 DIAGNOSIS — D696 Thrombocytopenia, unspecified: Secondary | ICD-10-CM | POA: Diagnosis present

## 2021-09-23 DIAGNOSIS — Z96652 Presence of left artificial knee joint: Secondary | ICD-10-CM

## 2021-09-23 DIAGNOSIS — Z96642 Presence of left artificial hip joint: Secondary | ICD-10-CM | POA: Diagnosis present

## 2021-09-23 DIAGNOSIS — Z952 Presence of prosthetic heart valve: Secondary | ICD-10-CM

## 2021-09-23 DIAGNOSIS — Z9889 Other specified postprocedural states: Secondary | ICD-10-CM | POA: Diagnosis not present

## 2021-09-23 DIAGNOSIS — Z79899 Other long term (current) drug therapy: Secondary | ICD-10-CM

## 2021-09-23 DIAGNOSIS — D62 Acute posthemorrhagic anemia: Principal | ICD-10-CM | POA: Diagnosis present

## 2021-09-23 DIAGNOSIS — D638 Anemia in other chronic diseases classified elsewhere: Secondary | ICD-10-CM | POA: Diagnosis present

## 2021-09-23 LAB — RETICULOCYTES
Immature Retic Fract: 36.8 % — ABNORMAL HIGH (ref 2.3–15.9)
RBC.: 2.56 MIL/uL — ABNORMAL LOW (ref 4.22–5.81)
Retic Count, Absolute: 82.7 10*3/uL (ref 19.0–186.0)
Retic Ct Pct: 3.2 % — ABNORMAL HIGH (ref 0.4–3.1)

## 2021-09-23 LAB — URINALYSIS, ROUTINE W REFLEX MICROSCOPIC
Bilirubin Urine: NEGATIVE
Glucose, UA: NEGATIVE mg/dL
Ketones, ur: NEGATIVE mg/dL
Leukocytes,Ua: NEGATIVE
Nitrite: NEGATIVE
Protein, ur: 30 mg/dL — AB
RBC / HPF: >50 RBC/hpf — ABNORMAL HIGH (ref 0–5)
Specific Gravity, Urine: 1.015 (ref 1.005–1.030)
pH: 5 (ref 5.0–8.0)

## 2021-09-23 LAB — PROTIME-INR
INR: 1.8 — ABNORMAL HIGH (ref 0.8–1.2)
Prothrombin Time: 20.8 seconds — ABNORMAL HIGH (ref 11.4–15.2)

## 2021-09-23 LAB — CBC WITH DIFFERENTIAL/PLATELET
Abs Immature Granulocytes: 0.04 10*3/uL (ref 0.00–0.07)
Basophils Absolute: 0 10*3/uL (ref 0.0–0.1)
Basophils Relative: 0 %
Eosinophils Absolute: 0.2 10*3/uL (ref 0.0–0.5)
Eosinophils Relative: 2 %
HCT: 25.3 % — ABNORMAL LOW (ref 39.0–52.0)
Hemoglobin: 8.3 g/dL — ABNORMAL LOW (ref 13.0–17.0)
Immature Granulocytes: 1 %
Lymphocytes Relative: 19 %
Lymphs Abs: 1.6 10*3/uL (ref 0.7–4.0)
MCH: 32.8 pg (ref 26.0–34.0)
MCHC: 32.8 g/dL (ref 30.0–36.0)
MCV: 100 fL (ref 80.0–100.0)
Monocytes Absolute: 1 10*3/uL (ref 0.1–1.0)
Monocytes Relative: 12 %
Neutro Abs: 5.6 10*3/uL (ref 1.7–7.7)
Neutrophils Relative %: 66 %
Platelets: 110 10*3/uL — ABNORMAL LOW (ref 150–400)
RBC: 2.53 MIL/uL — ABNORMAL LOW (ref 4.22–5.81)
RDW: 14.5 % (ref 11.5–15.5)
WBC: 8.4 10*3/uL (ref 4.0–10.5)
nRBC: 0 % (ref 0.0–0.2)

## 2021-09-23 LAB — BASIC METABOLIC PANEL
Anion gap: 9 (ref 5–15)
BUN: 20 mg/dL (ref 8–23)
CO2: 22 mmol/L (ref 22–32)
Calcium: 8.5 mg/dL — ABNORMAL LOW (ref 8.9–10.3)
Chloride: 108 mmol/L (ref 98–111)
Creatinine, Ser: 0.89 mg/dL (ref 0.61–1.24)
GFR, Estimated: >60 mL/min (ref >60)
Glucose, Bld: 137 mg/dL — ABNORMAL HIGH (ref 70–99)
Potassium: 3.6 mmol/L (ref 3.5–5.1)
Sodium: 139 mmol/L (ref 135–145)

## 2021-09-23 LAB — VITAMIN B12: Vitamin B-12: 277 pg/mL (ref 180–914)

## 2021-09-23 LAB — IRON AND TIBC
Iron: 52 ug/dL (ref 45–182)
Saturation Ratios: 21 % (ref 17.9–39.5)
TIBC: 244 ug/dL — ABNORMAL LOW (ref 250–450)
UIBC: 192 ug/dL

## 2021-09-23 LAB — PREPARE RBC (CROSSMATCH)

## 2021-09-23 LAB — FERRITIN: Ferritin: 136 ng/mL (ref 24–336)

## 2021-09-23 LAB — POC OCCULT BLOOD, ED: Fecal Occult Bld: NEGATIVE

## 2021-09-23 LAB — FOLATE: Folate: 28.9 ng/mL (ref >5.9)

## 2021-09-23 MED ORDER — SODIUM CHLORIDE (PF) 0.9 % IJ SOLN
INTRAMUSCULAR | Status: AC
  Filled 2021-09-23: qty 50

## 2021-09-23 MED ORDER — GABAPENTIN 100 MG PO CAPS
100.0000 mg | ORAL_CAPSULE | Freq: Every morning | ORAL | Status: DC
  Administered 2021-09-24 – 2021-09-26 (×3): 100 mg via ORAL
  Filled 2021-09-23 (×3): qty 1

## 2021-09-23 MED ORDER — SODIUM CHLORIDE 0.9 % IV SOLN: 10.0000 mL/h | Freq: Once | INTRAVENOUS | Status: DC

## 2021-09-23 MED ORDER — ALLOPURINOL 300 MG PO TABS
150.0000 mg | ORAL_TABLET | Freq: Every morning | ORAL | Status: DC
  Administered 2021-09-24 – 2021-09-26 (×3): 150 mg via ORAL
  Filled 2021-09-23 (×3): qty 1

## 2021-09-23 MED ORDER — IOHEXOL 350 MG/ML SOLN
100.0000 mL | Freq: Once | INTRAVENOUS | Status: AC | PRN
  Administered 2021-09-23: 100 mL via INTRAVENOUS

## 2021-09-23 MED ORDER — ACETAMINOPHEN 650 MG RE SUPP: 650.0000 mg | Freq: Four times a day (QID) | RECTAL | Status: DC | PRN

## 2021-09-23 MED ORDER — AMLODIPINE BESYLATE 5 MG PO TABS
10.0000 mg | ORAL_TABLET | Freq: Every day | ORAL | Status: DC
  Administered 2021-09-23 – 2021-09-26 (×4): 10 mg via ORAL
  Filled 2021-09-23 (×4): qty 2

## 2021-09-23 MED ORDER — GABAPENTIN 300 MG PO CAPS
300.0000 mg | ORAL_CAPSULE | Freq: Every day | ORAL | Status: DC
  Administered 2021-09-23 – 2021-09-25 (×3): 300 mg via ORAL
  Filled 2021-09-23 (×3): qty 1

## 2021-09-23 MED ORDER — ROSUVASTATIN CALCIUM 20 MG PO TABS
10.0000 mg | ORAL_TABLET | Freq: Every day | ORAL | Status: DC
  Administered 2021-09-23 – 2021-09-26 (×4): 10 mg via ORAL
  Filled 2021-09-23 (×4): qty 1

## 2021-09-23 MED ORDER — VITAMIN D 25 MCG (1000 UNIT) PO TABS
1000.0000 [IU] | ORAL_TABLET | Freq: Every day | ORAL | Status: DC
  Administered 2021-09-23 – 2021-09-26 (×4): 1000 [IU] via ORAL
  Filled 2021-09-23 (×4): qty 1

## 2021-09-23 MED ORDER — GABAPENTIN 100 MG PO CAPS: 100.0000 mg | ORAL_CAPSULE | ORAL | Status: DC

## 2021-09-23 MED ORDER — ATENOLOL 25 MG PO TABS
12.5000 mg | ORAL_TABLET | Freq: Every day | ORAL | Status: DC
  Administered 2021-09-24 – 2021-09-26 (×3): 12.5 mg via ORAL
  Filled 2021-09-23 (×3): qty 1

## 2021-09-23 MED ORDER — TAMSULOSIN HCL 0.4 MG PO CAPS
0.8000 mg | ORAL_CAPSULE | Freq: Every day | ORAL | Status: DC
  Administered 2021-09-23 – 2021-09-25 (×3): 0.8 mg via ORAL
  Filled 2021-09-23 (×3): qty 2

## 2021-09-23 MED ORDER — FERROUS SULFATE 325 (65 FE) MG PO TABS
325.0000 mg | ORAL_TABLET | Freq: Two times a day (BID) | ORAL | Status: DC
  Administered 2021-09-23 – 2021-09-26 (×6): 325 mg via ORAL
  Filled 2021-09-23 (×6): qty 1

## 2021-09-23 MED ORDER — FINASTERIDE 5 MG PO TABS
5.0000 mg | ORAL_TABLET | Freq: Every day | ORAL | Status: DC
  Administered 2021-09-23 – 2021-09-26 (×4): 5 mg via ORAL
  Filled 2021-09-23 (×4): qty 1

## 2021-09-23 MED ORDER — SODIUM CHLORIDE 0.9 % IV SOLN: INTRAVENOUS | Status: DC

## 2021-09-23 MED ORDER — ACETAMINOPHEN 325 MG PO TABS: 650.0000 mg | ORAL_TABLET | Freq: Four times a day (QID) | ORAL | Status: DC | PRN

## 2021-09-23 MED ORDER — ADULT MULTIVITAMIN W/MINERALS CH
1.0000 | ORAL_TABLET | Freq: Every day | ORAL | Status: DC
  Administered 2021-09-23 – 2021-09-26 (×4): 1 via ORAL
  Filled 2021-09-23 (×4): qty 1

## 2021-09-23 MED ORDER — HEPARIN (PORCINE) 25000 UT/250ML-% IV SOLN
1250.0000 [IU]/h | INTRAVENOUS | Status: DC
  Administered 2021-09-23 – 2021-09-25 (×3): 1250 [IU]/h via INTRAVENOUS
  Filled 2021-09-23 (×3): qty 250

## 2021-09-23 MED ORDER — SODIUM CHLORIDE 0.9 % IV BOLUS
1000.0000 mL | Freq: Once | INTRAVENOUS | Status: AC
  Administered 2021-09-23: 1000 mL via INTRAVENOUS

## 2021-09-23 MED ORDER — TRAZODONE HCL 100 MG PO TABS: 50.0000 mg | ORAL_TABLET | Freq: Every evening | ORAL | Status: DC | PRN

## 2021-09-23 MED ORDER — IOHEXOL 300 MG/ML  SOLN
100.0000 mL | Freq: Once | INTRAMUSCULAR | Status: AC | PRN
  Administered 2021-09-23: 100 mL via INTRAVENOUS

## 2021-09-23 MED ORDER — OXYCODONE HCL 5 MG PO TABS: 5.0000 mg | ORAL_TABLET | ORAL | Status: DC | PRN

## 2021-09-23 NOTE — ED Notes (Signed)
Patient transported to CT 

## 2021-09-23 NOTE — ED Provider Notes (Signed)
[facility] DEPT Provider Note   CSN: [id number] Arrival date & time: 09/23/21  5462     History  Chief Complaint  Patient presents with   Urinary Retention    Gerald Amas. is a 86 y.o. male.  Pt is a 86 yo male with a pmhx significant for HTN, gout, CAD, bph, urinary retention with prior surgeries, peripheral neuropathy, cva, afib (on coumadin), and recent left knee replacement (9/12).  Pt had problems urinating while in the hospital.  [name] was d/c home with instructions to self cath if unable to urinate.  [name] was unable to urinate since yesterday at 0900.  [name] self-cathed around 2200 last night and is still unable to urinate this am.  [name] said this has happened to him in the past when [name] had surgery.  [name] required a foley catheter.  [name] thinks [name] needs one now.  [name] has also felt very low on energy since his surgery.  [name] denies f/c.  No n/v.  [name] has been able to walk with his walker.   Pt restarted his coumadin the day after surgery.  [name] was on bridging lovenox.       Home Medications Prior to Admission medications   Medication Sig Start Date End Date Taking? Authorizing Provider  acetaminophen (TYLENOL) 500 MG tablet Take 2 tablets (1,000 mg total) by mouth every 6 (six) hours. 09/20/21  Yes Irving Copas, PA-C  allopurinol (ZYLOPRIM) 300 MG tablet Take 150 mg by mouth every morning.   Yes [provider]  amLODipine (NORVASC) 10 MG tablet TAKE 1 TABLET BY MOUTH  DAILY Patient taking differently: Take 10 mg by mouth daily. 04/26/21  Yes Weaver, Scott T, PA-C  atenolol (TENORMIN) 25 MG tablet Take 0.5 tablets (12.5 mg total) by mouth daily. 05/24/21  Yes Lavina Hamman, MD  cholecalciferol (VITAMIN D3) 25 MCG (1000 UNIT) tablet Take 1,000 Units by mouth daily.   Yes [provider]  Cinnamon 500 MG capsule Take 500 mg by mouth every morning.   Yes [provider]  enoxaparin (LOVENOX) 80 MG/0.8ML injection Inject 0.8 mLs  (80 mg total) into the skin every 12 (twelve) hours. 09/12/21  Yes Jerline Pain, MD  feeding supplement (ENSURE ENLIVE / ENSURE PLUS) LIQD Take 237 mLs by mouth 2 (two) times daily between meals. Patient taking differently: Take 237 mLs by mouth once a week. 05/24/21  Yes Lavina Hamman, MD  ferrous sulfate 325 (65 FE) MG EC tablet Take 325 mg by mouth 2 (two) times daily. 10/29/19  Yes [provider]  finasteride (PROSCAR) 5 MG tablet Take 1 tablet (5 mg total) by mouth daily. 11/05/16  Yes Angiulli, Lavon Paganini, PA-C  gabapentin (NEURONTIN) 100 MG capsule Take 100-300 mg by mouth See admin instructions. Take 1 capsule (100 mg) by mouth in the morning and take 3 capsules (300 mg) at bedtime   Yes [provider]  Glucos-Chondroit-Collag-Hyal (GLUCOSAMINE CHONDROIT-COLLAGEN PO) Take 1 tablet by mouth 2 (two) times daily.   Yes [provider]  Multiple Vitamins-Minerals (CENTRUM SILVER ADULT 50+) TABS Take 1 tablet by mouth daily.   Yes [provider]  oxyCODONE (OXY IR/ROXICODONE) 5 MG immediate release tablet Take 1 tablet (5 mg total) by mouth every 4 (four) hours as needed for severe pain. 09/20/21  Yes Irving Copas, PA-C  rosuvastatin (CRESTOR) 10 MG tablet Take 1 tablet (10 mg total) by mouth daily. 05/29/21  Yes [name] T,  PA-C  tamsulosin (FLOMAX) 0.4 MG CAPS capsule Take 2 capsules (0.8 mg total) by mouth daily after breakfast. Patient taking differently: Take 0.8 mg by mouth daily after supper. 11/05/16  Yes Angiulli, Lavon Paganini, PA-C  traZODone (DESYREL) 50 MG tablet Take 50 mg by mouth at bedtime as needed for sleep.   Yes [provider]  warfarin (COUMADIN) 5 MG tablet Take 1 tablet (5 mg total) by mouth daily at 4 PM. Until seen by warfarin clinic. Patient taking differently: Take 5-7.5 mg by mouth See admin instructions. Take 7.5 mg on Mondays Take 5 mg on all other days 05/24/21  Yes Lavina Hamman, MD  amoxicillin (AMOXIL) 500 MG  capsule Take 2,000 mg by mouth See admin instructions. Take 4 capsules (2000 mg) by mouth 1 hour prior to dental appointment    [provider]      Allergies    Sulfa antibiotics and Zestril [lisinopril]    Review of Systems   Review of Systems  Genitourinary:        Urinary retention  Musculoskeletal:        Left knee pain  All other systems reviewed and are negative.   Physical Exam Updated Vital Signs BP 130/63   Pulse 84   Temp 99.2 F (37.3 C) (Oral)   Resp 16   Ht 6' (1.829 m)   Wt 81.6 kg   SpO2 97%   BMI 24.41 kg/m  Physical Exam Vitals and nursing note reviewed.  Constitutional:      Appearance: Normal appearance.  HENT:     Head: Normocephalic and atraumatic.     Right Ear: External ear normal.     Left Ear: External ear normal.     Nose: Nose normal.     Mouth/Throat:     Mouth: Mucous membranes are dry.  Eyes:     Extraocular Movements: Extraocular movements intact.     Conjunctiva/sclera: Conjunctivae normal.     Pupils: Pupils are equal, round, and reactive to light.  Cardiovascular:     Rate and Rhythm: Normal rate and regular rhythm.     Pulses: Normal pulses.     Heart sounds: Normal heart sounds.  Pulmonary:     Effort: Pulmonary effort is normal.     Breath sounds: Normal breath sounds.  Abdominal:     General: Abdomen is flat. Bowel sounds are normal.     Palpations: Abdomen is soft.     Tenderness: There is abdominal tenderness in the suprapubic area.  Genitourinary:    Rectum: Guaiac result negative.  Musculoskeletal:     Cervical back: Normal range of motion and neck supple.     Comments: Left knee with a lot of bruising and some swelling c/w recent surgery.  No redness or drainage.  Skin:    General: Skin is warm.     Capillary Refill: Capillary refill takes less than 2 seconds.  Neurological:     General: No focal deficit present.     Mental Status: [name] is alert and oriented to person, place, and time.  Psychiatric:         Mood and Affect: Mood normal.        Behavior: Behavior normal.     ED Results / Procedures / Treatments   Labs (all labs ordered are listed, but only abnormal results are displayed) Labs Reviewed  URINALYSIS, ROUTINE W REFLEX MICROSCOPIC - Abnormal; Notable for the following components:      Result Value  APPearance HAZY (*)    Hgb urine dipstick LARGE (*)    Protein, ur 30 (*)    RBC / HPF >50 (*)    Bacteria, UA RARE (*)    All other components within normal limits  BASIC METABOLIC PANEL - Abnormal; Notable for the following components:   Glucose, Bld 137 (*)    Calcium 8.5 (*)    All other components within normal limits  CBC WITH DIFFERENTIAL/PLATELET - Abnormal; Notable for the following components:   RBC 2.53 (*)    Hemoglobin 8.3 (*)    HCT 25.3 (*)    Platelets 110 (*)    All other components within normal limits  IRON AND TIBC - Abnormal; Notable for the following components:   TIBC 244 (*)    All other components within normal limits  RETICULOCYTES - Abnormal; Notable for the following components:   Retic Ct Pct 3.2 (*)    RBC. 2.56 (*)    Immature Retic Fract 36.8 (*)    All other components within normal limits  PROTIME-INR - Abnormal; Notable for the following components:   Prothrombin Time 20.8 (*)    INR 1.8 (*)    All other components within normal limits  URINE CULTURE  VITAMIN B12  FOLATE  FERRITIN  POC OCCULT BLOOD, ED  TYPE AND SCREEN  PREPARE RBC (CROSSMATCH)    EKG EKG Interpretation  Date/Time:  Saturday September 23 2021 07:40:45 EDT Ventricular Rate:  91 PR Interval:    QRS Duration: 89 QT Interval:  435 QTC Calculation: 536 R Axis:   76 Text Interpretation: Atrial fibrillation Anterior infarct, old Borderline repolarization abnormality Prolonged QT interval No significant change since last tracing Confirmed by Isla Pence [phone]) on 09/23/2021 9:24:21 AM  Radiology CT ABDOMEN PELVIS W CONTRAST  Result Date:  09/23/2021 CLINICAL DATA:  86 year old male with trans urethral resection of the prostate 3 days ago. Unable to urinate now. EXAM: CT ABDOMEN AND PELVIS WITH CONTRAST TECHNIQUE: Multidetector CT imaging of the abdomen and pelvis was performed using the standard protocol following bolus administration of intravenous contrast. RADIATION DOSE REDUCTION: This exam was performed according to the departmental dose-optimization program which includes automated exposure control, adjustment of the mA and/or kV according to patient size and/or use of iterative reconstruction technique. CONTRAST:  183m OMNIPAQUE IOHEXOL 300 MG/ML  SOLN COMPARISON:  CT Abdomen and Pelvis 05/11/2016. FINDINGS: Lower chest: Prior sternotomy. Mild cardiomegaly. No pericardial or pleural effusion. Lung bases appear stable and negative. Hepatobiliary: Chronically absent gallbladder. Liver appears negative. Pancreas: Within normal limits. Spleen: Negative. Adrenals/Urinary Tract: Normal adrenal glands. Chronic right nephrolithiasis. Chronic exophytic but simple and benign appearing right renal lower pole cyst (no follow-up imaging recommended). No hydronephrosis. Symmetric renal enhancement and contrast excretion. No hydroureter. Urinary bladder is decompressed by a Foley catheter and contains a small volume of gas. Streak artifact from left hip arthroplasty limits detail of the base of the bladder and prostate. No adverse or complicating features are evident there. Stomach/Bowel: Retained stool in the rectosigmoid colon. Severe diverticulosis of the proximal sigmoid and descending colon. No active inflammation identified. Mild retained stool in redundant large bowel elsewhere. Normal appendix on series 2, image 67. No large bowel inflammation. Decompressed terminal ileum. No dilated small bowel. Stomach and duodenum appear negative. No free air or free fluid. Vascular/Lymphatic: Aortoiliac calcified atherosclerosis. Major arterial structures in the  abdomen and pelvis are patent. Chronic postoperative changes to the proximal right femoral vasculature. Portal venous system appears to  be patent. There is some pelvic venous contrast also. Grossly patent upper femoral venous vasculature. No lymphadenopathy identified. Reproductive: Urethral catheter in place. Streak artifact at the base of the bladder from left hip arthroplasty. Other: No pelvic free fluid is apparent. Musculoskeletal: Left hip arthroplasty is new since 2018. Associated pelvic streak artifact. Chronic T12-L1 and L1-L2 interbody ankylosis. No acute osseous abnormality identified. But there is nonspecific new proximal left thigh subcutaneous stranding and swelling on series 2, image 100. The proximal left femoral venous structures appear grossly patent there. No associated inguinal lymphadenopathy. No soft tissue gas. No fluid collection identified. IMPRESSION: 1. Left hip arthroplasty is new since 2018 with streak artifact limiting visualization of the base of the bladder and prostate. Urethral catheter in place decompressing the bladder. No adverse features identified following TURP. No hydronephrosis or hydroureter. Chronic right nephrolithiasis. 2. Partially visible generalized soft tissue swelling and inflammation in the proximal Left Thigh. Query Cellulitis. Grossly patent visible left Common femoral vein. 3. No other acute or inflammatory process identified in the abdomen or pelvis. Extensive diverticulosis of the distal large bowel. Aortic Atherosclerosis (ICD10-I70.0). Electronically Signed   By: Genevie Ann M.D.   On: 09/23/2021 10:41    Procedures Procedures    Medications Ordered in ED Medications  0.9 %  sodium chloride infusion (has no administration in time range)  sodium chloride 0.9 % bolus 1,000 mL (0 mLs Intravenous Stopped 09/23/21 1259)  iohexol (OMNIPAQUE) 300 MG/ML solution 100 mL (100 mLs Intravenous Contrast Given 09/23/21 1020)    ED Course/ Medical Decision Making/  A&P                           Medical Decision Making Amount and/or Complexity of Data Reviewed Labs: ordered. Radiology: ordered.  Risk Prescription drug management. Decision regarding hospitalization.   This patient presents to the ED for concern of urinary retention, this involves an extensive number of treatment options, and is a complaint that carries with it a high risk of complications and morbidity.  The differential diagnosis includes retention, uti, anemia   Co morbidities that complicate the patient evaluation  HTN, gout, CAD, bph, urinary retention with prior surgeries, peripheral neuropathy, cva, afib (on coumadin), and recent left knee replacement (9/12)   Additional history obtained:  Additional history obtained from epic chart review External records from outside source obtained and reviewed including wife   Lab Tests:  I Ordered, and personally interpreted labs.  The pertinent results include:  cbc with hgb 8.3 (hgb 9.8 on 9/13 and 12.4 at 9/6); inr 1.8; bmp with glucose 137   Imaging Studies ordered:  I ordered imaging studies including ct abd/pelvis  I independently visualized and interpreted imaging which showed  IMPRESSION:  1. Left hip arthroplasty is new since 2018 with streak artifact  limiting visualization of the base of the bladder and prostate.  Urethral catheter in place decompressing the bladder. No adverse  features identified following TURP. No hydronephrosis or  hydroureter. Chronic right nephrolithiasis.    2. Partially visible generalized soft tissue swelling and  inflammation in the proximal Left Thigh. Query Cellulitis. Grossly  patent visible left Common femoral vein.    3. No other acute or inflammatory process identified in the abdomen  or pelvis. Extensive diverticulosis of the distal large bowel.  Aortic Atherosclerosis (ICD10-I70.0).   I agree with the radiologist interpretation   Cardiac Monitoring:  The patient was  maintained on a cardiac monitor.  I personally viewed and interpreted the cardiac monitored which showed an underlying rhythm of: nsr   Medicines ordered and prescription drug management:  I ordered medication including ivfs  for dehydration; transfusion for anemia Reevaluation of the patient after these medicines showed that the patient improved I have reviewed the patients home medicines and have made adjustments as needed   Test Considered:  Ct abd/pelvis   Critical Interventions:  Foley, transfusion   Consultations Obtained:  I requested consultation with the hospitalist (Dr. Marylyn Ishihara),  and discussed lab and imaging findings as well as pertinent plan - [name] will admit Pt d/w Dr. [name] ([facility]) who will see pt in consult   Problem List / ED Course:  Urinary retention:  foley placed.  Pt feels better. Anemia:  hgb has dropped from 12.4 on 9/6 to 8.3 today. Pt is symptomatic, so I have ordered 1 unit for transfusion. Hematuria:  no evidence of blood clot in bladder.  Likely from coumadin.   Reevaluation:  After the interventions noted above, I reevaluated the patient and found that they have :improved   Social Determinants of Health:  Lives at home   Dispostion:  After consideration of the diagnostic results and the patients response to treatment, I feel that the patent would benefit from admission.    CRITICAL CARE Performed by: Isla Pence   Total critical care time: 30 minutes  Critical care time was exclusive of separately billable procedures and treating other patients.  Critical care was necessary to treat or prevent imminent or life-threatening deterioration.  Critical care was time spent personally by me on the following activities: development of treatment plan with patient and/or surrogate as well as nursing, discussions with consultants, evaluation of patient's response to treatment, examination of patient, obtaining history from patient or  surrogate, ordering and performing treatments and interventions, ordering and review of laboratory studies, ordering and review of radiographic studies, pulse oximetry and re-evaluation of patient's condition.         Final Clinical Impression(s) / ED Diagnoses Final diagnoses:  Urinary retention  Hematuria, unspecified type  Symptomatic anemia  Anticoagulated on Coumadin    Rx / DC Orders ED Discharge Orders     None         Isla Pence, MD 09/23/21 1446

## 2021-09-23 NOTE — ED Triage Notes (Signed)
Patient states he has been unable to urinate on his own since 0900 yesterday. Patient states he self cathed himself around 2200 last night. Patient recently had a right knee replacement.

## 2021-09-23 NOTE — Progress Notes (Signed)
ANTICOAGULATION CONSULT NOTE -   Consult  Pharmacy Consult for heparin Indication: atrial fibrillation  Allergies  Allergen Reactions   Sulfa Antibiotics Other (See Comments)    Granulocytosis   Zestril [Lisinopril] Cough    Patient Measurements: Height: 6' (182.9 cm) Weight: 81.6 kg (180 lb) IBW/kg (Calculated) : 77.6 Heparin Dosing Weight: 81.6 kg   Vital Signs: Temp: 99.2 F (37.3 C) (09/16 1202) Temp Source: Oral (09/16 1202) BP: 143/66 (09/16 1430) Pulse Rate: 84 (09/16 1430)  Labs: Recent Labs    09/23/21 0817 09/23/21 0833  HGB 8.3*  --   HCT 25.3*  --   PLT 110*  --   LABPROT  --  20.8*  INR  --  1.8*  CREATININE 0.89  --     Estimated Creatinine Clearance: 67.8 mL/min (by C-G formula based on SCr of 0.89 mg/dL).   Medical History: Past Medical History:  Diagnosis Date   Acquired dilation of ascending aorta and aortic root ([facility])    43m aortic root and 458mascending aorta on echo 03/2020   Arthritis    Bladder stones    Borderline diabetes    BPH (benign prostatic hyperplasia)    CAD (coronary artery disease) 12/19/2020   Non-obstructive coronary artery disease at cath in 2019   Coronary artery disease    cardiologist-  dr skMarlou PorchloCecille Rubinerhart NP--- per cath 06-02-2010 non-obstructive cad pLAD 30-40%   Diverticulosis of colon    Dysrhythmia    afib   Gout    Heart murmur    History of adenomatous polyp of colon    2002-- tubular adenoma   History of aortic insufficiency    severe -- s/p  AVR 08-03-2010   History of small bowel obstruction    02/ 2007 mechanical sbo s/p  surgical intervention;  partial sbo 09/ 2011 and 03-20-2011 resolved without surgical intervention   History of urinary retention    HTN (hypertension)    Peripheral neuropathy    Persistent atrial fibrillation (HCC)    S/P aortic valve replacement with prosthetic valve 08/03/2010   tissue valve   S/P Maze operation for atrial fibrillation 01/30/2018   Complete bilateral  atrial lesion set using bipolar radiofrequency and cryothermy with clipping of LA appendage   S/P MVR (mitral valve repair) 01/30/2018   Complex valvuloplasty including artificial Gore-tex neochord placement x4 and Carbo medics Annuloflex ring annuloplasty, size 28   S/P patent foramen ovale closure 08/03/2010   at same time AVR   S/P tricuspid valve repair 01/30/2018   Using an MC3 Annuloplasty ring, size 28   Stroke (HCC)    Tricuspid regurgitation     Medications:  Scheduled:   sodium chloride (PF)        Assessment: Pharmacy is consulted to initiate heparin drip in [age]o male for afib and recent history of CVA. Pt recently underwent TKA and was removed on warfarin with enoxaparin bridge. However, Hgb continued to drop and there is concern if pt has a hematoma in his surgically repaired knee. MD still wants pt anticoagulation to minimize stroke risk.    Today, 09/23/21  INR 1.8 Hgb 8.3, down from 9.8 previously  Last dose of enoxaparin per med rec was on 9/15 at 2000 per med rec.      Goal of Therapy:  Heparin level 0.3-0.7 units/ml Monitor platelets by anticoagulation protocol: Yes   Plan:  Per discussion with MD, no bolus since concern with hematoma Start heparin 1250 units/hr  HL  8 hours after start of infusion  Daily CBC  Monitor for signs and symptoms of bleeding  Royetta Asal, PharmD, BCPS 09/23/2021 4:12 PM

## 2021-09-23 NOTE — H&P (Signed)
History and Physical    Patient: [name]. VFI:[id number] DOB: 1935-12-26 DOA: 09/23/2021 DOS: the patient was seen and examined on 09/23/2021 PCP: Marda Stalker, PA-C  Patient coming from: Home  Chief Complaint:  Chief Complaint  Patient presents with   Urinary Retention   HPI: [name]. is a 86 y.o. male with medical history significant of CAD, BPH, a fib s/p MAZE, AVR (porcine), HTN. Presenting with fatigue and urinary retention. He reports that he had left knee surgery 4 days ago. He was discharged from the hospital 3 days ago. He noticed 2 days ago he was having difficulty urinating and he hasn't been able to urinate at all since yesterday. He reports that this had happened before when he has had surgery. He decided to come to the ED because he felt he needed a foley. He also reports increased fatigue and shortness of breath over the last couple of days. He hasn't had any chest pain or palpitations. He hasn't had any fevers, cough, or sick contacts. He denies any other aggravating or alleviating factors.    Review of Systems: As mentioned in the history of present illness. All other systems reviewed and are negative. Past Medical History:  Diagnosis Date   Acquired dilation of ascending aorta and aortic root (HCC)    78m aortic root and 465mascending aorta on echo 03/2020   Arthritis    Bladder stones    Borderline diabetes    BPH (benign prostatic hyperplasia)    CAD (coronary artery disease) 12/19/2020   Non-obstructive coronary artery disease at cath in 2019   Coronary artery disease    cardiologist-  dr skMarlou PorchloCecille Rubinerhart NP--- per cath 06-02-2010 non-obstructive cad pLAD 30-40%   Diverticulosis of colon    Dysrhythmia    afib   Gout    Heart murmur    History of adenomatous polyp of colon    2002-- tubular adenoma   History of aortic insufficiency    severe -- s/p  AVR 08-03-2010   History of small bowel obstruction    02/ 2007 mechanical  sbo s/p  surgical intervention;  partial sbo 09/ 2011 and 03-20-2011 resolved without surgical intervention   History of urinary retention    HTN (hypertension)    Peripheral neuropathy    Persistent atrial fibrillation (HCC)    S/P aortic valve replacement with prosthetic valve 08/03/2010   tissue valve   S/P Maze operation for atrial fibrillation 01/30/2018   Complete bilateral atrial lesion set using bipolar radiofrequency and cryothermy with clipping of LA appendage   S/P MVR (mitral valve repair) 01/30/2018   Complex valvuloplasty including artificial Gore-tex neochord placement x4 and Car[employer] medics Annuloflex ring annuloplasty, size 28   S/P patent foramen ovale closure 08/03/2010   at same time AVR   S/P tricuspid valve repair 01/30/2018   Using an MC3 Annuloplasty ring, size 28   Stroke (HCArtesian   Tricuspid regurgitation    Past Surgical History:  Procedure Laterality Date   BIOPSY  09/07/2018   Procedure: BIOPSY;  Surgeon: BrOtis BraceMD;  Location: [facility];  Service: Gastroenterology;;   CARDIAC CATHETERIZATION  06-02-2010  dr skMarlou Porch non-obstructive cad- pLAD 30-40%/  normal LVSF/  severe AI   CARDIOVASCULAR STRESS TEST  04/12/2016   Low risk nuclear perfusion study w/ no significant reversible ischemia/  normal LV function and wall motion ,  stress ef 60%/  [id number] and lateral scooped ST-segment depression  w/ exercise (may be repolarization abnormality), exercise capacity was moderately reduced   CATARACT EXTRACTION W/ INTRAOCULAR LENS  IMPLANT, BILATERAL  02/2010   CLIPPING OF ATRIAL APPENDAGE N/A 01/30/2018   Procedure: CLIPPING OF LEFT ATRIAL APPENDAGE USING ATRICLIP PRO2 45MM;  Surgeon: Rexene Alberts, MD;  Location: [place];  Service: Open Heart Surgery;  Laterality: N/A;   COLONOSCOPY WITH PROPOFOL N/A 10/21/2018   Procedure: COLONOSCOPY WITH PROPOFOL;  Surgeon: Otis Brace, MD;  Location: [facility];  Service: Gastroenterology;  Laterality: N/A;    CYSTOSCOPY WITH LITHOLAPAXY N/A 06/05/2016   Procedure: CYSTOSCOPY WITH LITHOLAPAXY and fulgarization of bladder neck;  Surgeon: Irine Seal, MD;  [facility];  Service: Urology;  Laterality: N/A;   ESOPHAGOGASTRODUODENOSCOPY (EGD) WITH PROPOFOL N/A 09/07/2018   Procedure: ESOPHAGOGASTRODUODENOSCOPY (EGD) WITH PROPOFOL;  Surgeon: Otis Brace, MD;  [facility]: [facility];  Service: Gastroenterology;  Laterality: N/A;   EXPLORATORY LAPARTOMY /  CHOLECYSTECTOMY  02/28/2005   for Small  bowel obstruction (mechnical)   IR ANGIO EXTRACRAN SEL COM CAROTID INNOMINATE UNI L MOD SED  10/24/2016   IR ANGIO VERTEBRAL SEL SUBCLAVIAN INNOMINATE BILAT MOD SED  10/24/2016   IR PERCUTANEOUS ART THROMBECTOMY/INFUSION INTRACRANIAL INC DIAG ANGIO  10/24/2016   IR RADIOLOGIST EVAL & MGMT  12/05/2016   LEFT KNEE ARTHROSCOPY  2006   MAZE N/A 01/30/2018   Procedure: MAZE;  Surgeon: Rexene Alberts, MD;  Location: [place];  Service: Open Heart Surgery;  Laterality: N/A;   MITRAL VALVE REPAIR N/A 01/30/2018   Procedure: MITRAL VALVE REPAIR (MVR) USING CARBOMEDICS ANNULOFLEX SIZE 28;  Surgeon: Rexene Alberts, MD;  Location: [place];  Service: Open Heart Surgery;  Laterality: N/A;   POLYPECTOMY  10/21/2018   Procedure: POLYPECTOMY;  Surgeon: Otis Brace, MD;  [facility];  Service: Gastroenterology;;   RADIOLOGY WITH ANESTHESIA N/A 10/24/2016   Procedure: RADIOLOGY WITH ANESTHESIA;  Surgeon: Luanne Bras, MD;  Location: [place];  Service: Radiology;  Laterality: N/A;   RIGHT FOOT SURGERY     RIGHT MINIATURE ANTERIOR THORACOTOMY FOR AORTIC VALVE REPLACEMENT AND CLOSURE PATENT FORAMEN OVALE  08-03-2010  DR Levada Schilling Magna-ease pericardial tissue valve (69m)   RIGHT/LEFT HEART CATH AND CORONARY ANGIOGRAPHY N/A 10/14/2017   Procedure: RIGHT/LEFT HEART CATH AND CORONARY ANGIOGRAPHY;  Surgeon: CSherren Mocha MD;  [facility];  Service: Cardiovascular;   Laterality: N/A;   TEE WITHOUT CARDIOVERSION N/A 10/14/2017   Procedure: TRANSESOPHAGEAL ECHOCARDIOGRAM (TEE);  Surgeon: NJosue Hector MD;  [facility];  Service: Cardiovascular;  Laterality: N/A;   TOTAL HIP ARTHROPLASTY Left 08/02/2020   Procedure: TOTAL HIP ARTHROPLASTY ANTERIOR APPROACH;  Surgeon: OParalee Cancel MD;  [facility];  Service: Orthopedics;  Laterality: Left;   TOTAL KNEE ARTHROPLASTY Left 09/19/2021   Procedure: TOTAL KNEE ARTHROPLASTY;  Surgeon: OParalee Cancel MD;  Location: [facility];  Service: Orthopedics;  Laterality: Left;   TRANSTHORACIC ECHOCARDIOGRAM  05/30/2016  dr skains   moderate  LVH ef 60-65%/  bioprothesis aortic valve present ,normal grandient and no AI /  mild MV calcification , moderate MR /  mild PR/ moderate TR/  PASP 375mg/ (RA denisty was identified 04-27-2016 echo) and is seen again today, this is likely a promient eustacian ridge, atrium is normal size   TRICUSPID VALVE REPLACEMENT N/A 01/30/2018   Procedure: TRICUSPID VALVE REPAIR USING MC3 SIZE 28;  Surgeon: OwRexene AlbertsMD;  [facility] Service: [facility];  Laterality: N/A;  Social History:  reports that he has never smoked. He has never used smokeless tobacco. He reports current alcohol use. He reports that he does not use drugs.  Allergies  Allergen Reactions   Sulfa Antibiotics Other (See Comments)    Granulocytosis   Zestril [Lisinopril] Cough    Family History  Problem Relation Age of Onset   Heart disease Mother    Brain cancer Father     Prior to Admission medications   Medication Sig Start Date End Date Taking? Authorizing Provider  acetaminophen (TYLENOL) 500 MG tablet Take 2 tablets (1,000 mg total) by mouth every 6 (six) hours. 09/20/21  Yes Irving Copas, PA-C  allopurinol (ZYLOPRIM) 300 MG tablet Take 150 mg by mouth every morning.   Yes [provider]  amLODipine (NORVASC) 10 MG tablet TAKE 1 TABLET BY MOUTH  DAILY Patient taking  differently: Take 10 mg by mouth daily. 04/26/21  Yes Weaver, Scott T, PA-C  atenolol (TENORMIN) 25 MG tablet Take 0.5 tablets (12.5 mg total) by mouth daily. 05/24/21  Yes Lavina Hamman, MD  cholecalciferol (VITAMIN D3) 25 MCG (1000 UNIT) tablet Take 1,000 Units by mouth daily.   Yes [provider]  Cinnamon 500 MG capsule Take 500 mg by mouth every morning.   Yes [provider]  enoxaparin (LOVENOX) 80 MG/0.8ML injection Inject 0.8 mLs (80 mg total) into the skin every 12 (twelve) hours. 09/12/21  Yes Jerline Pain, MD  feeding supplement (ENSURE ENLIVE / ENSURE PLUS) LIQD Take 237 mLs by mouth 2 (two) times daily between meals. Patient taking differently: Take 237 mLs by mouth once a week. 05/24/21  Yes Lavina Hamman, MD  ferrous sulfate 325 (65 FE) MG EC tablet Take 325 mg by mouth 2 (two) times daily. 10/29/19  Yes [provider]  finasteride (PROSCAR) 5 MG tablet Take 1 tablet (5 mg total) by mouth daily. 11/05/16  Yes Angiulli, Lavon Paganini, PA-C  gabapentin (NEURONTIN) 100 MG capsule Take 100-300 mg by mouth See admin instructions. Take 1 capsule (100 mg) by mouth in the morning and take 3 capsules (300 mg) at bedtime   Yes [provider]  Glucos-Chondroit-Collag-Hyal (GLUCOSAMINE CHONDROIT-COLLAGEN PO) Take 1 tablet by mouth 2 (two) times daily.   Yes [provider]  Multiple Vitamins-Minerals (CENTRUM SILVER ADULT 50+) TABS Take 1 tablet by mouth daily.   Yes [provider]  oxyCODONE (OXY IR/ROXICODONE) 5 MG immediate release tablet Take 1 tablet (5 mg total) by mouth every 4 (four) hours as needed for severe pain. 09/20/21  Yes Irving Copas, PA-C  rosuvastatin (CRESTOR) 10 MG tablet Take 1 tablet (10 mg total) by mouth daily. 05/29/21  Yes Weaver, Scott T, PA-C  tamsulosin (FLOMAX) 0.4 MG CAPS capsule Take 2 capsules (0.8 mg total) by mouth daily after breakfast. Patient taking differently: Take 0.8 mg by mouth daily after supper.  11/05/16  Yes Angiulli, Lavon Paganini, PA-C  traZODone (DESYREL) 50 MG tablet Take 50 mg by mouth at bedtime as needed for sleep.   Yes [provider]  warfarin (COUMADIN) 5 MG tablet Take 1 tablet (5 mg total) by mouth daily at 4 PM. Until seen by warfarin clinic. Patient taking differently: Take 5-7.5 mg by mouth See admin instructions. Take 7.5 mg on Mondays Take 5 mg on all other days 05/24/21  Yes Lavina Hamman, MD  amoxicillin (AMOXIL) 500 MG capsule Take 2,000 mg by mouth See admin instructions. Take 4 capsules (2000 mg)  by mouth 1 hour prior to dental appointment    [provider]    Physical Exam: Vitals:   09/23/21 1100 09/23/21 1154 09/23/21 1202 09/23/21 1230  BP: 135/68 136/67  130/63  Pulse: 90 (!) 38  84  Resp: '16 16  16  '$ Temp:   99.2 F (37.3 C)   TempSrc:   Oral   SpO2: 97% 97%  97%  Weight:      Height:       General: 86 y.o. male resting in bed in NAD Eyes: PERRL, normal sclera ENMT: Nares patent w/o discharge, orophaynx clear, dentition normal, ears w/o discharge/lesions/ulcers Neck: Supple, trachea midline Cardiovascular: irregular, +S1, S2, no m/g/r, equal pulses throughout Respiratory: CTABL, no w/r/r, normal WOB GI: BS+, NDNT, no masses noted, no organomegaly noted MSK: No c/c edema of L knee, ecchymosis noted Neuro: A&O x 3, no focal deficits Psyc: Appropriate interaction and affect, calm/cooperative  Data Reviewed:  Results for orders placed or performed during the hospital encounter of 09/23/21 (from the past 24 hour(s))  Basic metabolic panel     Status: Abnormal   Collection Time: 09/23/21  8:17 AM  Result Value Ref Range   Sodium 139 135 - 145 mmol/L   Potassium 3.6 3.5 - 5.1 mmol/L   Chloride 108 98 - 111 mmol/L   CO2 22 22 - 32 mmol/L   Glucose, Bld 137 (H) 70 - 99 mg/dL   BUN 20 8 - 23 mg/dL   Creatinine, Ser 0.89 0.61 - 1.24 mg/dL   Calcium 8.5 (L) 8.9 - 10.3 mg/dL   GFR, Estimated >60 >60 mL/min   Anion gap 9 5 - 15   CBC with Differential     Status: Abnormal   Collection Time: 09/23/21  8:17 AM  Result Value Ref Range   WBC 8.4 4.0 - 10.5 K/uL   RBC 2.53 (L) 4.22 - 5.81 MIL/uL   Hemoglobin 8.3 (L) 13.0 - 17.0 g/dL   HCT 25.3 (L) 39.0 - 52.0 %   MCV 100.0 80.0 - 100.0 fL   MCH 32.8 26.0 - 34.0 pg   MCHC 32.8 30.0 - 36.0 g/dL   RDW 14.5 11.5 - 15.5 %   Platelets 110 (L) 150 - 400 K/uL   nRBC 0.0 0.0 - 0.2 %   Neutrophils Relative % 66 %   Neutro Abs 5.6 1.7 - 7.7 K/uL   Lymphocytes Relative 19 %   Lymphs Abs 1.6 0.7 - 4.0 K/uL   Monocytes Relative 12 %   Monocytes Absolute 1.0 0.1 - 1.0 K/uL   Eosinophils Relative 2 %   Eosinophils Absolute 0.2 0.0 - 0.5 K/uL   Basophils Relative 0 %   Basophils Absolute 0.0 0.0 - 0.1 K/uL   Immature Granulocytes 1 %   Abs Immature Granulocytes 0.04 0.00 - 0.07 K/uL  Folate     Status: None   Collection Time: 09/23/21  8:17 AM  Result Value Ref Range   Folate 28.9 >5.9 ng/mL  Reticulocytes     Status: Abnormal   Collection Time: 09/23/21  8:17 AM  Result Value Ref Range   Retic Ct Pct 3.2 (H) 0.4 - 3.1 %   RBC. 2.56 (L) 4.22 - 5.81 MIL/uL   Retic Count, Absolute 82.7 19.0 - 186.0 K/uL   Immature Retic Fract 36.8 (H) 2.3 - 15.9 %  Protime-INR     Status: Abnormal   Collection Time: 09/23/21  8:33 AM  Result Value Ref Range   Prothrombin Time  20.8 (H) 11.4 - 15.2 seconds   INR 1.8 (H) 0.8 - 1.2  Urinalysis, Routine w reflex microscopic     Status: Abnormal   Collection Time: 09/23/21  8:45 AM  Result Value Ref Range   Color, Urine YELLOW YELLOW   APPearance HAZY (A) CLEAR   Specific Gravity, Urine 1.015 1.005 - 1.030   pH 5.0 5.0 - 8.0   Glucose, UA NEGATIVE NEGATIVE mg/dL   Hgb urine dipstick LARGE (A) NEGATIVE   Bilirubin Urine NEGATIVE NEGATIVE   Ketones, ur NEGATIVE NEGATIVE mg/dL   Protein, ur 30 (A) NEGATIVE mg/dL   Nitrite NEGATIVE NEGATIVE   Leukocytes,Ua NEGATIVE NEGATIVE   RBC / HPF >50 (H) 0 - 5 RBC/hpf   WBC, UA 11-20 0 - 5  WBC/hpf   Bacteria, UA RARE (A) NONE SEEN   Mucus PRESENT   Vitamin B12     Status: None   Collection Time: 09/23/21  8:51 AM  Result Value Ref Range   Vitamin B-12 277 180 - 914 pg/mL  Iron and TIBC     Status: Abnormal   Collection Time: 09/23/21  8:51 AM  Result Value Ref Range   Iron 52 45 - 182 ug/dL   TIBC 244 (L) 250 - 450 ug/dL   Saturation Ratios 21 17.9 - 39.5 %   UIBC 192 ug/dL  Ferritin     Status: None   Collection Time: 09/23/21  8:51 AM  Result Value Ref Range   Ferritin 136 24 - 336 ng/mL  Type and screen [place]     Status: None   Collection Time: 09/23/21  9:57 AM  Result Value Ref Range   ABO/RH(D) O POS    Antibody Screen NEG    Sample Expiration      09/26/2021,2359 Performed at [facility], [place] [address], [place], [place] [address]   POC occult blood, ED Provider will collect     Status: None   Collection Time: 09/23/21 10:02 AM  Result Value Ref Range   Fecal Occult Bld NEGATIVE NEGATIVE   CT ab/pelvis 1. Left hip arthroplasty is new since 2018 with streak artifact limiting visualization of the base of the bladder and prostate. Urethral catheter in place decompressing the bladder. No adverse features identified following TURP. No hydronephrosis or hydroureter. Chronic right nephrolithiasis. 2. Partially visible generalized soft tissue swelling and inflammation in the proximal Left Thigh. Query Cellulitis. Grossly patent visible left Common femoral vein. 3. No other acute or inflammatory process identified in the abdomen or pelvis. Extensive diverticulosis of the distal large bowel. Aortic Atherosclerosis (ICD10-I70.0).  EKG: a fib, no st elevations; prolonged Qt  Assessment and Plan: Urinary retention BPH     - place in obs, tele     - CT as above     - foley placed; continue; follow I&O     - continue flomax/proscar, fluids     - will need outpt follow up w/ urology for voiding  trial  Symptomatic anemia Thrombocytopenia     - likely secondary from bruising/bleeding after knee surgery; his Hgb was trending down prior to his discharge     - EDP has consulted ortho, appreciate assistance     - on coumadin; but still subtherapeutic     - his Hgb drop in the last 3 days is relatively low; will trend Hgb and start heparin gtt for now     - will resume coumadin depending on results of CTA lower  extremity     - 1 unit pRBCs ordered  Recent L TKA     - appropriate bruising/swelling of L knee     - his bleeding is likely there; checking CTA lower ext to assess     - EDP spoke with Ortho (Dr. [name]); they will eval, appreciate assistance  HLD CAD     - continue home regimen  HTN     - continue home regimen  Hx of CVA     - continue home regimen  A fib s/p MAZE Hx of AVR (porcine)     - on coumadin; but still subtherapeutic; reports that he has been compliant with his lovenox shots outpt     - his Hgb drop in the last 3 days is relatively low; will trend Hgb and start heparin gtt for now     - will resume coumadin depending on results of CTA lower extremity  Hyperglycemia     - check A1c  Advance Care Planning:   Code Status: FULL  Consults: Ortho (Dr. [name])  Family Communication: None at bedside  Severity of Illness: The appropriate patient status for this patient is OBSERVATION. Observation status is judged to be reasonable and necessary in order to provide the required intensity of service to ensure the patient's safety. The patient's presenting symptoms, physical exam findings, and initial radiographic and laboratory data in the context of their medical condition is felt to place them at decreased risk for further clinical deterioration. Furthermore, it is anticipated that the patient will be medically stable for discharge from the hospital within 2 midnights of admission.   Time spent in coordination of this H&P: 79 minutes  Author: Jonnie Finner,  DO 09/23/2021 2:21 PM  For on call review [email or web address].

## 2021-09-23 NOTE — Plan of Care (Signed)
Discussed with patient plan of care for this evening, pain management and blood transfusion with some teach back displayed.  Attempted to obtain another IV site but unsuccessful. Re-paged IV Team consult for IV request from first shift.  Problem: Education: Goal: Knowledge of the prescribed therapeutic regimen will improve Outcome: Progressing

## 2021-09-24 DIAGNOSIS — I4891 Unspecified atrial fibrillation: Secondary | ICD-10-CM | POA: Diagnosis not present

## 2021-09-24 DIAGNOSIS — D649 Anemia, unspecified: Secondary | ICD-10-CM | POA: Diagnosis not present

## 2021-09-24 LAB — COMPREHENSIVE METABOLIC PANEL
ALT: 21 U/L (ref 0–44)
AST: 27 U/L (ref 15–41)
Albumin: 3.2 g/dL — ABNORMAL LOW (ref 3.5–5.0)
Alkaline Phosphatase: 33 U/L — ABNORMAL LOW (ref 38–126)
Anion gap: 5 (ref 5–15)
BUN: 13 mg/dL (ref 8–23)
CO2: 24 mmol/L (ref 22–32)
Calcium: 8.2 mg/dL — ABNORMAL LOW (ref 8.9–10.3)
Chloride: 112 mmol/L — ABNORMAL HIGH (ref 98–111)
Creatinine, Ser: 0.82 mg/dL (ref 0.61–1.24)
GFR, Estimated: >60 mL/min (ref >60)
Glucose, Bld: 126 mg/dL — ABNORMAL HIGH (ref 70–99)
Potassium: 3.5 mmol/L (ref 3.5–5.1)
Sodium: 141 mmol/L (ref 135–145)
Total Bilirubin: 1.1 mg/dL (ref 0.3–1.2)
Total Protein: 6 g/dL — ABNORMAL LOW (ref 6.5–8.1)

## 2021-09-24 LAB — CBC
HCT: 24.5 % — ABNORMAL LOW (ref 39.0–52.0)
Hemoglobin: 8.3 g/dL — ABNORMAL LOW (ref 13.0–17.0)
MCH: 32.5 pg (ref 26.0–34.0)
MCHC: 33.9 g/dL (ref 30.0–36.0)
MCV: 96.1 fL (ref 80.0–100.0)
Platelets: 104 10*3/uL — ABNORMAL LOW (ref 150–400)
RBC: 2.55 MIL/uL — ABNORMAL LOW (ref 4.22–5.81)
RDW: 16 % — ABNORMAL HIGH (ref 11.5–15.5)
WBC: 7.7 10*3/uL (ref 4.0–10.5)
nRBC: 0.5 % — ABNORMAL HIGH (ref 0.0–0.2)

## 2021-09-24 LAB — URINE CULTURE: Culture: NO GROWTH

## 2021-09-24 LAB — HEPARIN LEVEL (UNFRACTIONATED)
Heparin Unfractionated: 0.45 IU/mL (ref 0.30–0.70)
Heparin Unfractionated: 0.56 IU/mL (ref 0.30–0.70)

## 2021-09-24 LAB — HEMOGLOBIN A1C
Hgb A1c MFr Bld: 5.5 % (ref 4.8–5.6)
Mean Plasma Glucose: 111.15 mg/dL

## 2021-09-24 LAB — HEMOGLOBIN AND HEMATOCRIT, BLOOD
HCT: 29.9 % — ABNORMAL LOW (ref 39.0–52.0)
Hemoglobin: 9.6 g/dL — ABNORMAL LOW (ref 13.0–17.0)

## 2021-09-24 MED ORDER — SENNOSIDES-DOCUSATE SODIUM 8.6-50 MG PO TABS
1.0000 | ORAL_TABLET | Freq: Two times a day (BID) | ORAL | Status: DC
  Administered 2021-09-24 – 2021-09-26 (×5): 1 via ORAL
  Filled 2021-09-24 (×5): qty 1

## 2021-09-24 MED ORDER — LACTULOSE 10 GM/15ML PO SOLN
20.0000 g | Freq: Every day | ORAL | Status: DC
  Administered 2021-09-24 – 2021-09-25 (×2): 20 g via ORAL
  Filled 2021-09-24 (×3): qty 30

## 2021-09-24 MED ORDER — POLYETHYLENE GLYCOL 3350 17 G PO PACK
17.0000 g | PACK | Freq: Every day | ORAL | Status: DC
  Administered 2021-09-24 – 2021-09-25 (×2): 17 g via ORAL
  Filled 2021-09-24 (×3): qty 1

## 2021-09-24 NOTE — Consult Note (Signed)
History:  [name]. is a 86 y.o. male with medical history significant of CAD, BPH, a fib s/p MAZE, AVR (porcine), HTN. Presenting with fatigue and urinary retention. He reports that he had left knee surgery 4 days ago. He was discharged from the hospital 3 days ago. He noticed 2 days ago he was having difficulty urinating and he hasn't been able to urinate at all since yesterday. He reports that this had happened before when he has had surgery. He decided to come to the ED because he felt he needed a foley. He also reports increased fatigue and shortness of breath over the last couple of days.   ROS: He hasn't had any chest pain or palpitations. He hasn't had any fevers, cough, or sick contacts. He denies any other aggravating or alleviating factors.    Past Medical History:  Diagnosis Date   Acquired dilation of ascending aorta and aortic root ([place])    67m aortic root and 424mascending aorta on echo 03/2020   Arthritis    Bladder stones    Borderline diabetes    BPH (benign prostatic hyperplasia)    CAD (coronary artery disease) 12/19/2020   Non-obstructive coronary artery disease at cath in 2019   Coronary artery disease    cardiologist-  dr skMarlou PorchloCecille Rubinerhart NP--- per cath 06-02-2010 non-obstructive cad pLAD 30-40%   Diverticulosis of colon    Dysrhythmia    afib   Gout    Heart murmur    History of adenomatous polyp of colon    2002-- tubular adenoma   History of aortic insufficiency    severe -- s/p  AVR 08-03-2010   History of small bowel obstruction    02/ 2007 mechanical sbo s/p  surgical intervention;  partial sbo 09/ 2011 and 03-20-2011 resolved without surgical intervention   History of urinary retention    HTN (hypertension)    Peripheral neuropathy    Persistent atrial fibrillation (HCC)    S/P aortic valve replacement with prosthetic valve 08/03/2010   tissue valve   S/P Maze operation for atrial fibrillation 01/30/2018   Complete bilateral  atrial lesion set using bipolar radiofrequency and cryothermy with clipping of LA appendage   S/P MVR (mitral valve repair) 01/30/2018   Complex valvuloplasty including artificial Gore-tex neochord placement x4 and Carbo medics Annuloflex ring annuloplasty, size 28   S/P patent foramen ovale closure 08/03/2010   at same time AVR   S/P tricuspid valve repair 01/30/2018   Using an MC3 Annuloplasty ring, size 28   Stroke (HCC)    Tricuspid regurgitation     Allergies  Allergen Reactions   Sulfa Antibiotics Other (See Comments)    Granulocytosis   Zestril [Lisinopril] Cough    No current facility-administered medications on file prior to encounter.   Current Outpatient Medications on File Prior to Encounter  Medication Sig Dispense Refill   acetaminophen (TYLENOL) 500 MG tablet Take 2 tablets (1,000 mg total) by mouth every 6 (six) hours. 30 tablet 0   allopurinol (ZYLOPRIM) 300 MG tablet Take 150 mg by mouth every morning.     amLODipine (NORVASC) 10 MG tablet TAKE 1 TABLET BY MOUTH  DAILY (Patient taking differently: Take 10 mg by mouth daily.) 90 tablet 2   atenolol (TENORMIN) 25 MG tablet Take 0.5 tablets (12.5 mg total) by mouth daily.     cholecalciferol (VITAMIN D3) 25 MCG (1000 UNIT) tablet Take 1,000 Units by mouth daily.  Cinnamon 500 MG capsule Take 500 mg by mouth every morning.     enoxaparin (LOVENOX) 80 MG/0.8ML injection Inject 0.8 mLs (80 mg total) into the skin every 12 (twelve) hours. 16 mL 1   feeding supplement (ENSURE ENLIVE / ENSURE PLUS) LIQD Take 237 mLs by mouth 2 (two) times daily between meals. (Patient taking differently: Take 237 mLs by mouth once a week.) 237 mL 12   ferrous sulfate 325 (65 FE) MG EC tablet Take 325 mg by mouth 2 (two) times daily.     finasteride (PROSCAR) 5 MG tablet Take 1 tablet (5 mg total) by mouth daily. 30 tablet 0   gabapentin (NEURONTIN) 100 MG capsule Take 100-300 mg by mouth See admin instructions. Take 1 capsule (100 mg) by  mouth in the morning and take 3 capsules (300 mg) at bedtime     Glucos-Chondroit-Collag-Hyal (GLUCOSAMINE CHONDROIT-COLLAGEN PO) Take 1 tablet by mouth 2 (two) times daily.     Multiple Vitamins-Minerals (CENTRUM SILVER ADULT 50+) TABS Take 1 tablet by mouth daily.     oxyCODONE (OXY IR/ROXICODONE) 5 MG immediate release tablet Take 1 tablet (5 mg total) by mouth every 4 (four) hours as needed for severe pain. 42 tablet 0   rosuvastatin (CRESTOR) 10 MG tablet Take 1 tablet (10 mg total) by mouth daily. 90 tablet 3   tamsulosin (FLOMAX) 0.4 MG CAPS capsule Take 2 capsules (0.8 mg total) by mouth daily after breakfast. (Patient taking differently: Take 0.8 mg by mouth daily after supper.) 30 capsule 0   traZODone (DESYREL) 50 MG tablet Take 50 mg by mouth at bedtime as needed for sleep.     warfarin (COUMADIN) 5 MG tablet Take 1 tablet (5 mg total) by mouth daily at 4 PM. Until seen by warfarin clinic. (Patient taking differently: Take 5-7.5 mg by mouth See admin instructions. Take 7.5 mg on Mondays Take 5 mg on all other days) 135 tablet 3   amoxicillin (AMOXIL) 500 MG capsule Take 2,000 mg by mouth See admin instructions. Take 4 capsules (2000 mg) by mouth 1 hour prior to dental appointment      Physical Exam: Vitals:   09/24/21 0459 09/24/21 0500  BP: 131/72   Pulse: 62   Resp: 19 19  Temp: 97.6 F (36.4 C)   SpO2: 94%    Body mass index is 24.41 kg/m. He is alert and oriented x3 No shortness of breath or chest pain at present Abdomen is soft and nontender.  No loss of bowel or bladder control, no rebound tenderness.  Patient does have hematuria No focal neurological deficits on sensorimotor testing in the lower extremity. Patient is a surgical dry skin over his left knee which is clean dry and intact.  There is some ecchymosis of bruising throughout the lower extremity.  He has no pain with palpation over his knee or with gentle range of motion.  Compartments are soft and nontender in  the lower extremity.  Image: CT ANGIO LOW EXTREM LEFT W &/OR WO CONTRAST  Result Date: 09/23/2021 CLINICAL DATA:  Anemia. Left knee bruising and swelling after total knee arthroplasty 3 days ago. EXAM: CT ANGIOGRAPHY OF THE LEFT LOWEREXTREMITY TECHNIQUE: Multidetector CT imaging of the left lowerwas performed using the standard protocol during bolus administration of intravenous contrast. Multiplanar CT image reconstructions and MIPs were obtained to evaluate the vascular anatomy. RADIATION DOSE REDUCTION: This exam was performed according to the departmental dose-optimization program which includes automated exposure control, adjustment of the mA and/or  kV according to patient size and/or use of iterative reconstruction technique. CONTRAST:  140m OMNIPAQUE IOHEXOL 350 MG/ML SOLN COMPARISON:  None Available. FINDINGS: Bones/Joint/Cartilage Left total hip and knee arthroplasties. No evidence of hardware failure or loosening. No fracture or dislocation. Joint spaces are preserved. Small left knee joint effusion. Ligaments Ligaments are suboptimally evaluated by CT. Muscles and Tendons Expected intramuscular postsurgical change and subcutaneous emphysema in the distal left thigh and proximal lower leg. Soft tissue Expected postsurgical soft tissue swelling of the left thigh and lower leg. No fluid collection or hematoma. No soft tissue mass. Scattered atherosclerotic calcification within the arterial system of both legs without high-grade stenosis. Normal three-vessel runoff bilaterally. Review of the MIP images confirms the above findings. IMPRESSION: 1. Left total knee arthroplasty with expected postsurgical changes in the surrounding soft tissues. No hematoma. Electronically Signed   By: WTitus DubinM.D.   On: 09/23/2021 16:26   CT ABDOMEN PELVIS W CONTRAST  Result Date: 09/23/2021 CLINICAL DATA:  86year old male with trans urethral resection of the prostate 3 days ago. Unable to urinate now. EXAM: CT  ABDOMEN AND PELVIS WITH CONTRAST TECHNIQUE: Multidetector CT imaging of the abdomen and pelvis was performed using the standard protocol following bolus administration of intravenous contrast. RADIATION DOSE REDUCTION: This exam was performed according to the departmental dose-optimization program which includes automated exposure control, adjustment of the mA and/or kV according to patient size and/or use of iterative reconstruction technique. CONTRAST:  1025mOMNIPAQUE IOHEXOL 300 MG/ML  SOLN COMPARISON:  CT Abdomen and Pelvis 05/11/2016. FINDINGS: Lower chest: Prior sternotomy. Mild cardiomegaly. No pericardial or pleural effusion. Lung bases appear stable and negative. Hepatobiliary: Chronically absent gallbladder. Liver appears negative. Pancreas: Within normal limits. Spleen: Negative. Adrenals/Urinary Tract: Normal adrenal glands. Chronic right nephrolithiasis. Chronic exophytic but simple and benign appearing right renal lower pole cyst (no follow-up imaging recommended). No hydronephrosis. Symmetric renal enhancement and contrast excretion. No hydroureter. Urinary bladder is decompressed by a Foley catheter and contains a small volume of gas. Streak artifact from left hip arthroplasty limits detail of the base of the bladder and prostate. No adverse or complicating features are evident there. Stomach/Bowel: Retained stool in the rectosigmoid colon. Severe diverticulosis of the proximal sigmoid and descending colon. No active inflammation identified. Mild retained stool in redundant large bowel elsewhere. Normal appendix on series 2, image 67. No large bowel inflammation. Decompressed terminal ileum. No dilated small bowel. Stomach and duodenum appear negative. No free air or free fluid. Vascular/Lymphatic: Aortoiliac calcified atherosclerosis. Major arterial structures in the abdomen and pelvis are patent. Chronic postoperative changes to the proximal right femoral vasculature. Portal venous system appears  to be patent. There is some pelvic venous contrast also. Grossly patent upper femoral venous vasculature. No lymphadenopathy identified. Reproductive: Urethral catheter in place. Streak artifact at the base of the bladder from left hip arthroplasty. Other: No pelvic free fluid is apparent. Musculoskeletal: Left hip arthroplasty is new since 2018. Associated pelvic streak artifact. Chronic T12-L1 and L1-L2 interbody ankylosis. No acute osseous abnormality identified. But there is nonspecific new proximal left thigh subcutaneous stranding and swelling on series 2, image 100. The proximal left femoral venous structures appear grossly patent there. No associated inguinal lymphadenopathy. No soft tissue gas. No fluid collection identified. IMPRESSION: 1. Left hip arthroplasty is new since 2018 with streak artifact limiting visualization of the base of the bladder and prostate. Urethral catheter in place decompressing the bladder. No adverse features identified following TURP. No hydronephrosis or hydroureter. Chronic  right nephrolithiasis. 2. Partially visible generalized soft tissue swelling and inflammation in the proximal Left Thigh. Query Cellulitis. Grossly patent visible left Common femoral vein. 3. No other acute or inflammatory process identified in the abdomen or pelvis. Extensive diverticulosis of the distal large bowel. Aortic Atherosclerosis (ICD10-I70.0). Electronically Signed   By: Genevie Ann M.D.   On: 09/23/2021 10:41    A/P: Rush Landmark is a very pleasant 86 year old gentleman who had a recent total knee replacement who presents because of urinary retention.  Foley was placed and he was admitted for further medical care.  Patient does have gross hematuria.  From the standpoint of his knee replacement I did speak with Dr. Alvan Dame and at this point no further intervention is required.  He has no pain or any signs of infection or wound healing complications.  Continue medical intervention for his nonorthopedic  issues.  Dr. Alvan Dame will reevaluate the patient in the morning.

## 2021-09-24 NOTE — Progress Notes (Signed)
Okay for IV heparin Indication: atrial fibrillation, hx of CVA  Allergies  Allergen Reactions   Sulfa Antibiotics Other (See Comments)    Granulocytosis   Zestril [Lisinopril] Cough    Patient Measurements: Height: 6' (182.9 cm) Weight: 81.6 kg (180 lb) IBW/kg (Calculated) : 77.6 Heparin Dosing Weight: 81.6 kg   Vital Signs: Temp: 98.5 F (36.9 C) (09/17 0043) Temp Source: Oral (09/17 0043) BP: 139/78 (09/17 0043) Pulse Rate: 91 (09/17 0043)  Labs: Recent Labs    09/23/21 0817 09/23/21 0833 09/24/21 0058  HGB 8.3*  --  8.3*  HCT 25.3*  --  24.5*  PLT 110*  --  104*  LABPROT  --  20.8*  --   INR  --  1.8*  --   HEPARINUNFRC  --   --  0.45  CREATININE 0.89  --  0.82     Estimated Creatinine Clearance: 73.6 mL/min (by C-G formula based on SCr of 0.82 mg/dL).   Medical History: Past Medical History:  Diagnosis Date   Acquired dilation of ascending aorta and aortic root ([place])    66m aortic root and 443mascending aorta on echo 03/2020   Arthritis    Bladder stones    Borderline diabetes    BPH (benign prostatic hyperplasia)    CAD (coronary artery disease) 12/19/2020   Non-obstructive coronary artery disease at cath in 2019   Coronary artery disease    cardiologist-  dr skMarlou PorchloCecille Rubinerhart NP--- per cath 06-02-2010 non-obstructive cad pLAD 30-40%   Diverticulosis of colon    Dysrhythmia    afib   Gout    Heart murmur    History of adenomatous polyp of colon    2002-- tubular adenoma   History of aortic insufficiency    severe -- s/p  AVR 08-03-2010   History of small bowel obstruction    02/ 2007 mechanical sbo s/p  surgical intervention;  partial sbo 09/ 2011 and 03-20-2011 resolved without surgical intervention   History of urinary retention    HTN (hypertension)    Peripheral neuropathy    Persistent atrial fibrillation (HCC)    S/P aortic valve replacement with prosthetic valve 08/03/2010   tissue  valve   S/P Maze operation for atrial fibrillation 01/30/2018   Complete bilateral atrial lesion set using bipolar radiofrequency and cryothermy with clipping of LA appendage   S/P MVR (mitral valve repair) 01/30/2018   Complex valvuloplasty including artificial Gore-tex neochord placement x4 and Carbo medics Annuloflex ring annuloplasty, size 28   S/P patent foramen ovale closure 08/03/2010   at same time AVR   S/P tricuspid valve repair 01/30/2018   Using an MC3 Annuloplasty ring, size 28   [facility]   Tricuspid regurgitation     Assessment: Pharmacy is consulted to initiate heparin drip in [age]o male for afib and history of CVA. Pt recently underwent TKA on 09/19/21 and was discharged on warfarin with enoxaparin bridge. However, Hgb continued to drop and there is concern if pt has a hematoma in his surgically repaired knee. MD still wants anticoagulation to minimize stroke risk. Pharmacy consulted for IV heparin dosing. Per discussion with MD, no bolus since concern for hematoma. Last doses of Enoxaparin and Warfarin on 09/22/21. Admission INR 1.8.    Today, 09/24/21  Heparin level = 0.45 units/mL, therapeutic  CBC: Hgb 8.3, down from 9.8 previously on 9/13. Pltc 104K, low/slightly decreased  Patient received 1 unit PRBCs CTA of left  lower extremity: no hematoma RN reports urine was pink-tinged earlier in her shift, but currently amber in color  No infusion issues noted per nursing   Goal of Therapy:  Heparin level 0.3-0.7 units/ml Monitor platelets by anticoagulation protocol: Yes   Plan:   Continue heparin infusion at 1250 units/hr  Heparin level at 9am to confirm remains within therapeutic range   Daily CBC, heparin level Monitor for signs and symptoms of bleeding F/u for resumption of warfarin   Lindell Spar PharmD, BCPS 09/24/2021 4:36 AM

## 2021-09-24 NOTE — Hospital Course (Signed)
[name] is an 86 yo male with PMH CAD, BPH, afib s/p MAZE, AVR (porcine), HTN.  He presented with urinary retention and worsening fatigue. He recently underwent left total knee replacement on 09/19/2021 due to history of left knee osteoarthritis. Upon returning home he continued to have difficulty voiding.  He stated this has happened in the past requiring Foley placement. He has also had ongoing significant swelling and bruising in the left knee. Hemoglobin on admission was 8.3 g/dL down from baseline of around 11 to 12 g/dL. He received 1 unit PRBC on admission due to symptomatic anemia. He was initially started on heparin drip while Lovenox and Coumadin were held.  Urine from Foley continued to have hematuria.  Case was discussed with urology and heparin was held with Foley catheter flushes ordered.  Urine cleared to clear yellow prior to discharge.  He was resumed back on Coumadin with Lovenox bridge at discharge.  Foley catheter continued at discharge and he will follow-up outpatient with urology.

## 2021-09-24 NOTE — Progress Notes (Signed)
Progress Note    [name].   IDP:[id number]  DOB: 04/15/1935  DOA: 09/23/2021     0 PCP: Marda Stalker, PA-C  Initial CC: lethargy, urinary retention   Hospital Course: [name] is an 86 yo male with PMH CAD, BPH, afib s/p MAZE, AVR (porcine), HTN.  He presented with urinary retention and worsening fatigue. He recently underwent left total knee replacement on 09/19/2021 due to history of left knee osteoarthritis. Upon returning home he continued to have difficulty voiding.  He stated this has happened in the past requiring Foley placement. He has also had ongoing significant swelling and bruising in the left knee. Hemoglobin on admission was 8.3 g/dL down from baseline of around 11 to 12 g/dL. He received 1 unit PRBC on admission due to symptomatic anemia.  Interval History:  Patient seen this morning.  He has ambulated some and wants to continue doing so.  Not having too much pain with ambulating but still ongoing swelling in his left knee. Reviewed labs with him notably his hemoglobin after blood transfusion.  He was also seen by orthopedic surgery this morning. We will continue trending hemoglobin through today and hopeful for transitioning back to Lovenox and Coumadin tomorrow.  Assessment and Plan:  Urinary retention BPH -History of requiring Foley in the past.  He follows outpatient with urology typically - Continue Foley, retention is likely due to his weakness and deconditioning - Continue Flomax and Proscar   Symptomatic anemia ABLA Thrombocytopenia -Suspected from recent surgery and underlying bruising -   Underwent CT abdomen/pelvis and CTA left lower extremity.  No hematomas appreciated or active bleeding - 1 unit PRBC ordered on admission.  Hemoglobin has improved to 9.6 g/dL this morning - Continue trending hemoglobin - Continue heparin drip, probable transition back to Lovenox and Coumadin tomorrow   Recent L TKA - appropriate  bruising/swelling of L knee not unexpected - Hgb will slowly improve as well as swelling; no concerning findings on CTA or CT A/P -Seen by orthopedic surgery this morning as well, appreciate assistance and evaluation   HLD CAD - continue home regimen   HTN - continue home regimen   Hx of CVA - continue home regimen   A fib s/p MAZE Hx of AVR (porcine) - on coumadin at home; was on lovenox bridge as well -Continue heparin drip through today and if hemoglobin remaining stable tomorrow, will transition back   Hyperglycemia -A1c 5.5% - Continue diet control   Old records reviewed in assessment of this patient  Antimicrobials:   DVT prophylaxis:     Code Status:   Code Status: Full Code  Mobility Assessment (last 72 hours)     Mobility Assessment     Row Name 09/23/21 2031 09/23/21 1800         Does patient have an order for bedrest or is patient medically unstable No - Continue assessment No - Continue assessment      What is the highest level of mobility based on the progressive mobility assessment? Level 5 (Walks with assist in room/hall) - Balance while stepping forward/back and can walk in room with assist - Complete Level 5 (Walks with assist in room/hall) - Balance while stepping forward/back and can walk in room with assist - Complete               Barriers to discharge: None Disposition Plan: Home Status is: Observation  Objective: Blood pressure 126/75, pulse 79, temperature 100 F (37.8 C), temperature source  Oral, resp. rate 20, height 6' (1.829 m), weight 81.6 kg, SpO2 94 %.  Examination:  Physical Exam Constitutional:      General: He is not in acute distress.    Appearance: Normal appearance.  HENT:     Head: Normocephalic and atraumatic.     Mouth/Throat:     Mouth: Mucous membranes are moist.  Eyes:     Extraocular Movements: Extraocular movements intact.  Cardiovascular:     Rate and Rhythm: Normal rate and regular rhythm.     Heart  sounds: Normal heart sounds.  Pulmonary:     Effort: Pulmonary effort is normal. No respiratory distress.     Breath sounds: Normal breath sounds. No wheezing.  Abdominal:     General: Bowel sounds are normal. There is no distension.     Palpations: Abdomen is soft.     Tenderness: There is no abdominal tenderness.  Genitourinary:    Comments: Foley catheter in place with mild blood-tinged urine Musculoskeletal:     Cervical back: Normal range of motion and neck supple.     Comments: Left knee noted with surgical dressing in place and surrounding large ecchymoses and swelling  Skin:    General: Skin is warm and dry.  Neurological:     General: No focal deficit present.     Mental Status: He is alert.  Psychiatric:        Mood and Affect: Mood normal.        Behavior: Behavior normal.      Consultants:  Orthopedic surgery  Procedures:  Left total knee replacement, 09/19/2021  Data Reviewed: Results for orders placed or performed during the hospital encounter of 09/23/21 (from the past 24 hour(s))  Hemoglobin A1c     Status: None   Collection Time: 09/24/21 12:58 AM  Result Value Ref Range   Hgb A1c MFr Bld 5.5 4.8 - 5.6 %   Mean Plasma Glucose 111.15 mg/dL  Comprehensive metabolic panel     Status: Abnormal   Collection Time: 09/24/21 12:58 AM  Result Value Ref Range   Sodium 141 135 - 145 mmol/L   Potassium 3.5 3.5 - 5.1 mmol/L   Chloride 112 (H) 98 - 111 mmol/L   CO2 24 22 - 32 mmol/L   Glucose, Bld 126 (H) 70 - 99 mg/dL   BUN 13 8 - 23 mg/dL   Creatinine, Ser 0.82 0.61 - 1.24 mg/dL   Calcium 8.2 (L) 8.9 - 10.3 mg/dL   Total Protein 6.0 (L) 6.5 - 8.1 g/dL   Albumin 3.2 (L) 3.5 - 5.0 g/dL   AST 27 15 - 41 U/L   ALT 21 0 - 44 U/L   Alkaline Phosphatase 33 (L) 38 - 126 U/L   Total Bilirubin 1.1 0.3 - 1.2 mg/dL   GFR, Estimated >60 >60 mL/min   Anion gap 5 5 - 15  Heparin level (unfractionated)     Status: None   Collection Time: 09/24/21 12:58 AM  Result Value  Ref Range   Heparin Unfractionated 0.45 0.30 - 0.70 IU/mL  CBC     Status: Abnormal   Collection Time: 09/24/21 12:58 AM  Result Value Ref Range   WBC 7.7 4.0 - 10.5 K/uL   RBC 2.55 (L) 4.22 - 5.81 MIL/uL   Hemoglobin 8.3 (L) 13.0 - 17.0 g/dL   HCT 24.5 (L) 39.0 - 52.0 %   MCV 96.1 80.0 - 100.0 fL   MCH 32.5 26.0 - 34.0 pg   MCHC 33.9  30.0 - 36.0 g/dL   RDW 16.0 (H) 11.5 - 15.5 %   Platelets 104 (L) 150 - 400 K/uL   nRBC 0.5 (H) 0.0 - 0.2 %  Hemoglobin and hematocrit, blood     Status: Abnormal   Collection Time: 09/24/21  8:15 AM  Result Value Ref Range   Hemoglobin 9.6 (L) 13.0 - 17.0 g/dL   HCT 29.9 (L) 39.0 - 52.0 %  Heparin level (unfractionated)     Status: None   Collection Time: 09/24/21  8:15 AM  Result Value Ref Range   Heparin Unfractionated 0.56 0.30 - 0.70 IU/mL    I have Reviewed nursing notes, Vitals, and Lab results since pt's last encounter. Pertinent lab results : see above I have ordered test including BMP, CBC, Mg I have reviewed the last note from staff over past 24 hours I have discussed pt's care plan and test results with nursing staff, case manager   LOS: 0 days   Dwyane Dee, MD [facility] 09/24/2021, 1:22 PM

## 2021-09-24 NOTE — Progress Notes (Signed)
[place] for IV heparin Indication: atrial fibrillation, hx of CVA  Allergies  Allergen Reactions   Sulfa Antibiotics Other (See Comments)    Granulocytosis   Zestril [Lisinopril] Cough    Patient Measurements: Height: 6' (182.9 cm) Weight: 81.6 kg (180 lb) IBW/kg (Calculated) : 77.6 Heparin Dosing Weight: 81.6 kg   Vital Signs: Temp: 100 F (37.8 C) (09/17 1304) Temp Source: Oral (09/17 1304) BP: 126/75 (09/17 1304) Pulse Rate: 79 (09/17 1304)  Labs: Recent Labs    09/23/21 0817 09/23/21 0833 09/24/21 0058 09/24/21 0815  HGB 8.3*  --  8.3* 9.6*  HCT 25.3*  --  24.5* 29.9*  PLT 110*  --  104*  --   LABPROT  --  20.8*  --   --   INR  --  1.8*  --   --   HEPARINUNFRC  --   --  0.45 0.56  CREATININE 0.89  --  0.82  --      Estimated Creatinine Clearance: 73.6 mL/min (by C-G formula based on SCr of 0.82 mg/dL).   Medical History: Past Medical History:  Diagnosis Date   Acquired dilation of ascending aorta and aortic root ([place])    32m aortic root and 450mascending aorta on echo 03/2020   Arthritis    Bladder stones    Borderline diabetes    BPH (benign prostatic hyperplasia)    CAD (coronary artery disease) 12/19/2020   Non-obstructive coronary artery disease at cath in 2019   Coronary artery disease    cardiologist-  dr skMarlou PorchloCecille Rubinerhart NP--- per cath 06-02-2010 non-obstructive cad pLAD 30-40%   Diverticulosis of colon    Dysrhythmia    afib   Gout    Heart murmur    History of adenomatous polyp of colon    2002-- tubular adenoma   History of aortic insufficiency    severe -- s/p  AVR 08-03-2010   History of small bowel obstruction    02/ 2007 mechanical sbo s/p  surgical intervention;  partial sbo 09/ 2011 and 03-20-2011 resolved without surgical intervention   History of urinary retention    HTN (hypertension)    Peripheral neuropathy    Persistent atrial fibrillation (HCC)    S/P aortic valve  replacement with prosthetic valve 08/03/2010   tissue valve   S/P Maze operation for atrial fibrillation 01/30/2018   Complete bilateral atrial lesion set using bipolar radiofrequency and cryothermy with clipping of LA appendage   S/P MVR (mitral valve repair) 01/30/2018   Complex valvuloplasty including artificial Gore-tex neochord placement x4 and Carbo medics Annuloflex ring annuloplasty, size 28   S/P patent foramen ovale closure 08/03/2010   at same time AVR   S/P tricuspid valve repair 01/30/2018   Using an MC3 Annuloplasty ring, size 28   [facility]   Tricuspid regurgitation     Assessment: Pharmacy is consulted to initiate heparin drip in [age] male for afib and history of CVA. Pt recently underwent TKA on 09/19/21 and was discharged on warfarin with enoxaparin bridge. However, Hgb continued to drop and there is concern if pt has a hematoma in his surgically repaired knee. MD still wants anticoagulation to minimize stroke risk. Pharmacy consulted for IV heparin dosing. Per discussion with MD, no bolus since concern for hematoma. Last doses of Enoxaparin and Warfarin on 09/22/21. Admission INR 1.8.    Today, 09/24/21  Heparin level = 0.45 units/mL, therapeutic  CBC: Hgb up to 9.6  now   Pltc 104K, low/slightly decreased  Patient received 1 unit PRBCs CTA of left lower extremity: no hematoma No line or bleeding issues noted  Per discussion with primary MD, keep on heparin for now without any further changes to anticoag regimen for today   Goal of Therapy:  Heparin level 0.3-0.7 units/ml Monitor platelets by anticoagulation protocol: Yes   Plan:  Continue heparin infusion at 1250 units/hr  Daily CBC, heparin level Monitor for signs and symptoms of bleeding F/u for resumption of warfarin    Royetta Asal, PharmD, BCPS 09/24/2021 1:21 PM

## 2021-09-25 ENCOUNTER — Ambulatory Visit: Payer: Medicare Other

## 2021-09-25 DIAGNOSIS — Z8673 Personal history of transient ischemic attack (TIA), and cerebral infarction without residual deficits: Secondary | ICD-10-CM | POA: Diagnosis not present

## 2021-09-25 DIAGNOSIS — R739 Hyperglycemia, unspecified: Secondary | ICD-10-CM | POA: Diagnosis present

## 2021-09-25 DIAGNOSIS — Z7901 Long term (current) use of anticoagulants: Secondary | ICD-10-CM | POA: Diagnosis not present

## 2021-09-25 DIAGNOSIS — E785 Hyperlipidemia, unspecified: Secondary | ICD-10-CM | POA: Diagnosis present

## 2021-09-25 DIAGNOSIS — Z808 Family history of malignant neoplasm of other organs or systems: Secondary | ICD-10-CM | POA: Diagnosis not present

## 2021-09-25 DIAGNOSIS — I4891 Unspecified atrial fibrillation: Secondary | ICD-10-CM | POA: Diagnosis not present

## 2021-09-25 DIAGNOSIS — N401 Enlarged prostate with lower urinary tract symptoms: Secondary | ICD-10-CM | POA: Diagnosis present

## 2021-09-25 DIAGNOSIS — R338 Other retention of urine: Secondary | ICD-10-CM | POA: Diagnosis present

## 2021-09-25 DIAGNOSIS — R319 Hematuria, unspecified: Secondary | ICD-10-CM | POA: Diagnosis not present

## 2021-09-25 DIAGNOSIS — R7303 Prediabetes: Secondary | ICD-10-CM | POA: Diagnosis present

## 2021-09-25 DIAGNOSIS — D6959 Other secondary thrombocytopenia: Secondary | ICD-10-CM | POA: Diagnosis present

## 2021-09-25 DIAGNOSIS — I700 Atherosclerosis of aorta: Secondary | ICD-10-CM | POA: Diagnosis present

## 2021-09-25 DIAGNOSIS — Z953 Presence of xenogenic heart valve: Secondary | ICD-10-CM | POA: Diagnosis not present

## 2021-09-25 DIAGNOSIS — Z79899 Other long term (current) drug therapy: Secondary | ICD-10-CM | POA: Diagnosis not present

## 2021-09-25 DIAGNOSIS — I4819 Other persistent atrial fibrillation: Secondary | ICD-10-CM | POA: Diagnosis present

## 2021-09-25 DIAGNOSIS — Z9889 Other specified postprocedural states: Secondary | ICD-10-CM | POA: Diagnosis not present

## 2021-09-25 DIAGNOSIS — Z96642 Presence of left artificial hip joint: Secondary | ICD-10-CM | POA: Diagnosis present

## 2021-09-25 DIAGNOSIS — Z96652 Presence of left artificial knee joint: Secondary | ICD-10-CM | POA: Diagnosis present

## 2021-09-25 DIAGNOSIS — D649 Anemia, unspecified: Secondary | ICD-10-CM | POA: Diagnosis not present

## 2021-09-25 DIAGNOSIS — Z8249 Family history of ischemic heart disease and other diseases of the circulatory system: Secondary | ICD-10-CM | POA: Diagnosis not present

## 2021-09-25 DIAGNOSIS — I10 Essential (primary) hypertension: Secondary | ICD-10-CM | POA: Diagnosis present

## 2021-09-25 DIAGNOSIS — D62 Acute posthemorrhagic anemia: Secondary | ICD-10-CM | POA: Diagnosis present

## 2021-09-25 DIAGNOSIS — R310 Gross hematuria: Secondary | ICD-10-CM | POA: Diagnosis present

## 2021-09-25 DIAGNOSIS — I2510 Atherosclerotic heart disease of native coronary artery without angina pectoris: Secondary | ICD-10-CM | POA: Diagnosis present

## 2021-09-25 LAB — CBC
HCT: 25 % — ABNORMAL LOW (ref 39.0–52.0)
Hemoglobin: 8 g/dL — ABNORMAL LOW (ref 13.0–17.0)
MCH: 31.4 pg (ref 26.0–34.0)
MCHC: 32 g/dL (ref 30.0–36.0)
MCV: 98 fL (ref 80.0–100.0)
Platelets: 124 10*3/uL — ABNORMAL LOW (ref 150–400)
RBC: 2.55 MIL/uL — ABNORMAL LOW (ref 4.22–5.81)
RDW: 15.7 % — ABNORMAL HIGH (ref 11.5–15.5)
WBC: 7.6 10*3/uL (ref 4.0–10.5)
nRBC: 0.3 % — ABNORMAL HIGH (ref 0.0–0.2)

## 2021-09-25 LAB — BPAM RBC
Blood Product Expiration Date: 202310232359
ISSUE DATE / TIME: 202309162159
Unit Type and Rh: 5100

## 2021-09-25 LAB — BASIC METABOLIC PANEL
Anion gap: 6 (ref 5–15)
BUN: 12 mg/dL (ref 8–23)
CO2: 24 mmol/L (ref 22–32)
Calcium: 8.1 mg/dL — ABNORMAL LOW (ref 8.9–10.3)
Chloride: 110 mmol/L (ref 98–111)
Creatinine, Ser: 0.89 mg/dL (ref 0.61–1.24)
GFR, Estimated: >60 mL/min (ref >60)
Glucose, Bld: 125 mg/dL — ABNORMAL HIGH (ref 70–99)
Potassium: 3.5 mmol/L (ref 3.5–5.1)
Sodium: 140 mmol/L (ref 135–145)

## 2021-09-25 LAB — TYPE AND SCREEN
ABO/RH(D): O POS
Antibody Screen: NEGATIVE
Unit division: 0

## 2021-09-25 LAB — MAGNESIUM: Magnesium: 2.4 mg/dL (ref 1.7–2.4)

## 2021-09-25 LAB — PROTIME-INR
INR: 1.5 — ABNORMAL HIGH (ref 0.8–1.2)
Prothrombin Time: 17.9 seconds — ABNORMAL HIGH (ref 11.4–15.2)

## 2021-09-25 LAB — HEPARIN LEVEL (UNFRACTIONATED): Heparin Unfractionated: 0.45 IU/mL (ref 0.30–0.70)

## 2021-09-25 MED ORDER — CHLORHEXIDINE GLUCONATE CLOTH 2 % EX PADS
6.0000 | MEDICATED_PAD | Freq: Every day | CUTANEOUS | Status: DC
  Administered 2021-09-25 – 2021-09-26 (×2): 6 via TOPICAL

## 2021-09-25 NOTE — Progress Notes (Signed)
Mobility Specialist - Progress Note   09/25/21 1248  Mobility  HOB Elevated/Bed Position Self regulated  Activity Ambulated with assistance in hallway  Range of Motion/Exercises Active  Level of Assistance Independent after set-up  Assistive Device Front wheel walker  Distance Ambulated (ft) 350 ft  Activity Response Tolerated well  $Mobility charge 1 Mobility   Pt was found in bed and agreeable to mobilize. L. Knee is still swollen and bruised. He also stated feeling a little dizzy and fatigued when ambulating. Pt at EOS returned to bed with all necessities in reach.  Ferd Hibbs Mobility Specialist

## 2021-09-25 NOTE — Progress Notes (Signed)
[place] for IV heparin Indication: atrial fibrillation, hx of CVA  Allergies  Allergen Reactions   Sulfa Antibiotics Other (See Comments)    Granulocytosis   Zestril [Lisinopril] Cough    Patient Measurements: Height: 6' (182.9 cm) Weight: 81.6 kg (180 lb) IBW/kg (Calculated) : 77.6 Heparin Dosing Weight: 81.6 kg   Vital Signs: Temp: 98.9 F (37.2 C) (09/17 2041) BP: 124/63 (09/17 2041) Pulse Rate: 77 (09/17 2041)  Labs: Recent Labs    09/23/21 0817 09/23/21 0833 09/24/21 0058 09/24/21 0815 09/25/21 0340  HGB 8.3*  --  8.3* 9.6* 8.0*  HCT 25.3*  --  24.5* 29.9* 25.0*  PLT 110*  --  104*  --  124*  LABPROT  --  20.8*  --   --  17.9*  INR  --  1.8*  --   --  1.5*  HEPARINUNFRC  --   --  0.45 0.56 0.45  CREATININE 0.89  --  0.82  --  0.89     Estimated Creatinine Clearance: 67.8 mL/min (by C-G formula based on SCr of 0.89 mg/dL).   Medical History: Past Medical History:  Diagnosis Date   Acquired dilation of ascending aorta and aortic root ([place])    10m aortic root and 459mascending aorta on echo 03/2020   Arthritis    Bladder stones    Borderline diabetes    BPH (benign prostatic hyperplasia)    CAD (coronary artery disease) 12/19/2020   Non-obstructive coronary artery disease at cath in 2019   Coronary artery disease    cardiologist-  dr skMarlou PorchloCecille Rubinerhart NP--- per cath 06-02-2010 non-obstructive cad pLAD 30-40%   Diverticulosis of colon    Dysrhythmia    afib   Gout    Heart murmur    History of adenomatous polyp of colon    2002-- tubular adenoma   History of aortic insufficiency    severe -- s/p  AVR 08-03-2010   History of small bowel obstruction    02/ 2007 mechanical sbo s/p  surgical intervention;  partial sbo 09/ 2011 and 03-20-2011 resolved without surgical intervention   History of urinary retention    HTN (hypertension)    Peripheral neuropathy    Persistent atrial fibrillation (HCC)    S/P  aortic valve replacement with prosthetic valve 08/03/2010   tissue valve   S/P Maze operation for atrial fibrillation 01/30/2018   Complete bilateral atrial lesion set using bipolar radiofrequency and cryothermy with clipping of LA appendage   S/P MVR (mitral valve repair) 01/30/2018   Complex valvuloplasty including artificial Gore-tex neochord placement x4 and Carbo medics Annuloflex ring annuloplasty, size 28   S/P patent foramen ovale closure 08/03/2010   at same time AVR   S/P tricuspid valve repair 01/30/2018   Using an MC3 Annuloplasty ring, size 28   [facility]   Tricuspid regurgitation     Assessment: Pharmacy is consulted to initiate heparin drip in [age] male for afib and history of CVA. Pt recently underwent TKA on 09/19/21 and was discharged on warfarin with enoxaparin bridge. However, Hgb continued to drop and there is concern if pt has a hematoma in his surgically repaired knee. MD still wants anticoagulation to minimize stroke risk. Pharmacy consulted for IV heparin dosing. Per discussion with MD, no bolus since concern for hematoma. Last doses of Enoxaparin and Warfarin on 09/22/21. Admission INR 1.8.    Today, 09/25/21  Heparin level = 0.45 units/mL, therapeutic  CBC:  Hgb low/decreased to 8. Pltc 124K, low but improved.   CTA of left lower extremity: no hematoma No infusion or bleeding issues noted per nursing  Goal of Therapy:  Heparin level 0.3-0.7 units/ml Monitor platelets by anticoagulation protocol: Yes   Plan:  Continue heparin infusion at 1250 units/hr  Daily CBC, heparin level Monitor for signs and symptoms of bleeding F/u for resumption of warfarin   Lindell Spar, PharmD, BCPS Clinical Pharmacist  09/25/2021 4:45 AM

## 2021-09-25 NOTE — Progress Notes (Signed)
  Transition of Care [facility]) Screening Note   Patient Details  Name: [name]. Date of Birth: 08/11/1935   Transition of Care [facility]) CM/SW Contact:    Roseanne Kaufman, RN Phone Number: 09/25/2021, 2:13 PM    Transition of Care Department [facility]) has reviewed patient and no TOC needs have been identified at this time. We will continue to monitor patient advancement through interdisciplinary progression rounds. If new patient transition needs arise, please place a TOC consult.

## 2021-09-25 NOTE — Progress Notes (Signed)
Progress Note    [name]   VQQ:[id number]  DOB: [birth year]  DOA: 09/23/2021     0 PCP: Marda Stalker, PA-C  Initial CC: lethargy, urinary retention   Hospital Course: Mr. Stankowski is an 87 yo male with PMH CAD, BPH, afib s/p MAZE, AVR (porcine), HTN.  He presented with urinary retention and worsening fatigue. He recently underwent left total knee replacement on 09/19/2021 due to history of left knee osteoarthritis. Upon returning home he continued to have difficulty voiding.  He stated this has happened in the past requiring Foley placement. He has also had ongoing significant swelling and bruising in the left knee. Hemoglobin on admission was 8.3 g/dL down from baseline of around 11 to 12 g/dL. He received 1 unit PRBC on admission due to symptomatic anemia.  Interval History:  No events overnight.  Foley still draining hematuria with further hemoglobin drop today.  Knee appears about the same. Called and discussed with his urologist, Dr. Jeffie Pollock. Going to hold heparin today and flush foley often and monitor output response.   Assessment and Plan:  Urinary retention BPH -History of requiring Foley in the past.  He follows outpatient with urology typically - Continue Foley, retention is likely due to his weakness and deconditioning - Continue Flomax and Proscar -Outpatient follow-up with Dr. Jeffie Pollock  Hematuria - likely due to ongoing anticoagulation in setting of foley; some possible urethral trauma with insertion too - d/c heparin today and leave off for about 24 hrs - start flushes via foley every 4 hours - discussed with Dr. Jeffie Pollock. If no improvement by tomorrow, will formally consult urology at that time    Symptomatic anemia ABLA Thrombocytopenia -Suspected from recent surgery and underlying bruising -   Underwent CT abdomen/pelvis and CTA left lower extremity.  No hematomas appreciated or active bleeding - 1 unit PRBC ordered on admission - Continue  trending hemoglobin; down to 8 g/dL this am - trend further and will transfuse still if necessary    Recent L TKA - appropriate bruising/swelling of L knee not unexpected - Hgb will slowly improve as well as swelling; no concerning findings on CTA or CT A/P -Seen by orthopedic surgery, appreciate evaluation    HLD CAD - continue home regimen   HTN - continue home regimen   Hx of CVA - continue home regimen   A fib s/p MAZE Hx of AVR (porcine) - on coumadin at home; was on lovenox bridge as well - hold heparin in setting of hematuria    Hyperglycemia -A1c 5.5% - Continue diet control   Old records reviewed in assessment of this patient  Antimicrobials:   DVT prophylaxis:  SCD for now    Code Status:   Code Status: Full Code  Mobility Assessment (last 72 hours)     Mobility Assessment     Row Name 09/23/21 2031 09/23/21 1800         Does patient have an order for bedrest or is patient medically unstable No - Continue assessment No - Continue assessment      What is the highest level of mobility based on the progressive mobility assessment? Level 5 (Walks with assist in room/hall) - Balance while stepping forward/back and can walk in room with assist - Complete Level 5 (Walks with assist in room/hall) - Balance while stepping forward/back and can walk in room with assist - Complete               Barriers to discharge:  None Disposition Plan: Home Status is: Observation  Objective: Blood pressure 125/65, pulse 73, temperature 99.5 F (37.5 C), temperature source Oral, resp. rate 16, height 6' (1.829 m), weight 81.6 kg, SpO2 96 %.  Examination:  Physical Exam Constitutional:      General: He is not in acute distress.    Appearance: Normal appearance.  HENT:     Head: Normocephalic and atraumatic.     Mouth/Throat:     Mouth: Mucous membranes are moist.  Eyes:     Extraocular Movements: Extraocular movements intact.  Cardiovascular:     Rate and  Rhythm: Normal rate and regular rhythm.     Heart sounds: Normal heart sounds.  Pulmonary:     Effort: Pulmonary effort is normal. No respiratory distress.     Breath sounds: Normal breath sounds. No wheezing.  Abdominal:     General: Bowel sounds are normal. There is no distension.     Palpations: Abdomen is soft.     Tenderness: There is no abdominal tenderness.  Genitourinary:    Comments: Foley catheter in place with ongoing blood-tinged urine Musculoskeletal:     Cervical back: Normal range of motion and neck supple.     Comments: Left knee noted with surgical dressing in place and surrounding large ecchymoses and swelling  Skin:    General: Skin is warm and dry.  Neurological:     General: No focal deficit present.     Mental Status: He is alert.  Psychiatric:        Mood and Affect: Mood normal.        Behavior: Behavior normal.      Consultants:  Orthopedic surgery  Procedures:  Left total knee replacement, 09/19/2021  Data Reviewed: Results for orders placed or performed during the hospital encounter of 09/23/21 (from the past 24 hour(s))  CBC     Status: Abnormal   Collection Time: 09/25/21  3:40 AM  Result Value Ref Range   WBC 7.6 4.0 - 10.5 K/uL   RBC 2.55 (L) 4.22 - 5.81 MIL/uL   Hemoglobin 8.0 (L) 13.0 - 17.0 g/dL   HCT 25.0 (L) 39.0 - 52.0 %   MCV 98.0 80.0 - 100.0 fL   MCH 31.4 26.0 - 34.0 pg   MCHC 32.0 30.0 - 36.0 g/dL   RDW 15.7 (H) 11.5 - 15.5 %   Platelets 124 (L) 150 - 400 K/uL   nRBC 0.3 (H) 0.0 - 0.2 %  Protime-INR     Status: Abnormal   Collection Time: 09/25/21  3:40 AM  Result Value Ref Range   Prothrombin Time 17.9 (H) 11.4 - 15.2 seconds   INR 1.5 (H) 0.8 - 1.2  Heparin level (unfractionated)     Status: None   Collection Time: 09/25/21  3:40 AM  Result Value Ref Range   Heparin Unfractionated 0.45 0.30 - 0.70 IU/mL  Basic metabolic panel     Status: Abnormal   Collection Time: 09/25/21  3:40 AM  Result Value Ref Range   Sodium  140 135 - 145 mmol/L   Potassium 3.5 3.5 - 5.1 mmol/L   Chloride 110 98 - 111 mmol/L   CO2 24 22 - 32 mmol/L   Glucose, Bld 125 (H) 70 - 99 mg/dL   BUN 12 8 - 23 mg/dL   Creatinine, Ser 0.89 0.61 - 1.24 mg/dL   Calcium 8.1 (L) 8.9 - 10.3 mg/dL   GFR, Estimated >60 >60 mL/min   Anion gap 6 5 - 15  Magnesium     Status: None   Collection Time: 09/25/21  3:40 AM  Result Value Ref Range   Magnesium 2.4 1.7 - 2.4 mg/dL    I have Reviewed nursing notes, Vitals, and Lab results since pt's last encounter. Pertinent lab results : see above I have ordered test including BMP, CBC, Mg I have reviewed the last note from staff over past 24 hours I have discussed pt's care plan and test results with nursing staff, case manager   LOS: 0 days   Dwyane Dee, MD [facility] 09/25/2021, 1:48 PM

## 2021-09-26 ENCOUNTER — Telehealth: Payer: Self-pay | Admitting: Cardiology

## 2021-09-26 ENCOUNTER — Ambulatory Visit (INDEPENDENT_AMBULATORY_CARE_PROVIDER_SITE_OTHER): Payer: Medicare Other

## 2021-09-26 DIAGNOSIS — Z7901 Long term (current) use of anticoagulants: Secondary | ICD-10-CM | POA: Diagnosis not present

## 2021-09-26 DIAGNOSIS — R319 Hematuria, unspecified: Secondary | ICD-10-CM | POA: Diagnosis not present

## 2021-09-26 DIAGNOSIS — Z9889 Other specified postprocedural states: Secondary | ICD-10-CM

## 2021-09-26 DIAGNOSIS — D649 Anemia, unspecified: Secondary | ICD-10-CM | POA: Diagnosis not present

## 2021-09-26 DIAGNOSIS — Z96652 Presence of left artificial knee joint: Secondary | ICD-10-CM

## 2021-09-26 LAB — MAGNESIUM: Magnesium: 2.1 mg/dL (ref 1.7–2.4)

## 2021-09-26 LAB — BASIC METABOLIC PANEL
Anion gap: 6 (ref 5–15)
BUN: 15 mg/dL (ref 8–23)
CO2: 23 mmol/L (ref 22–32)
Calcium: 8.3 mg/dL — ABNORMAL LOW (ref 8.9–10.3)
Chloride: 109 mmol/L (ref 98–111)
Creatinine, Ser: 0.8 mg/dL (ref 0.61–1.24)
GFR, Estimated: >60 mL/min (ref >60)
Glucose, Bld: 114 mg/dL — ABNORMAL HIGH (ref 70–99)
Potassium: 3.7 mmol/L (ref 3.5–5.1)
Sodium: 138 mmol/L (ref 135–145)

## 2021-09-26 LAB — CBC
HCT: 26.3 % — ABNORMAL LOW (ref 39.0–52.0)
Hemoglobin: 8.5 g/dL — ABNORMAL LOW (ref 13.0–17.0)
MCH: 31.8 pg (ref 26.0–34.0)
MCHC: 32.3 g/dL (ref 30.0–36.0)
MCV: 98.5 fL (ref 80.0–100.0)
Platelets: 136 10*3/uL — ABNORMAL LOW (ref 150–400)
RBC: 2.67 MIL/uL — ABNORMAL LOW (ref 4.22–5.81)
RDW: 15.7 % — ABNORMAL HIGH (ref 11.5–15.5)
WBC: 7.3 10*3/uL (ref 4.0–10.5)
nRBC: 0.3 % — ABNORMAL HIGH (ref 0.0–0.2)

## 2021-09-26 LAB — PROTIME-INR
INR: 1.3 — ABNORMAL HIGH (ref 0.8–1.2)
Prothrombin Time: 15.6 seconds — ABNORMAL HIGH (ref 11.4–15.2)

## 2021-09-26 MED ORDER — ENOXAPARIN SODIUM 80 MG/0.8ML IJ SOSY
80.0000 mg | PREFILLED_SYRINGE | Freq: Once | INTRAMUSCULAR | Status: AC
  Administered 2021-09-26: 80 mg via SUBCUTANEOUS
  Filled 2021-09-26: qty 0.8

## 2021-09-26 NOTE — Discharge Instructions (Signed)
Follow up with Dr. Alvan Dame  Follow up with Dr. Jeffie Pollock  Have an INR check by 9/22 in case of need for Lovenox refills

## 2021-09-26 NOTE — Telephone Encounter (Signed)
Patient is reporting that he was released from the hospital today and wants to report his INR - 1.2.  Patient would like a call back to know if he should adjust his medication.

## 2021-09-26 NOTE — Patient Instructions (Signed)
Description   Called and instructed pt to take 2 tablets today and 1.5 tablets tomorrow and then continue taking warfarin 1 tablet daily except for 1.5 tablets on Mondays.  Recheck INR in 1 week. [facility] [phone].

## 2021-09-26 NOTE — Discharge Summary (Signed)
Physician Discharge Summary   [name]. OJJ:[id number] DOB: 11-Nov-1935 DOA: 09/23/2021  PCP: Marda Stalker, PA-C  Admit date: 09/23/2021 Discharge date: 09/26/2021  Barriers to discharge: none  Admitted From: Home Disposition:  Home Discharging physician: Dwyane Dee, MD  Recommendations for Outpatient Follow-up:  Follow up with urology Follow-up with orthopedic surgery Patient instructed to have INR check by 09/29/2021  Home Health:  Equipment/Devices:   Discharge Condition: stable CODE STATUS: Full Diet recommendation:  Diet Orders (From admission, onward)     Start     Ordered   09/26/21 0000  Diet - low sodium heart healthy        09/26/21 0954   09/23/21 1112  Diet Heart Room service appropriate? Yes; Fluid consistency: Thin  Diet effective now       Question Answer Comment  Room service appropriate? Yes   Fluid consistency: Thin      09/23/21 1111            Hospital Course: [name] is an 86 yo male with PMH CAD, BPH, afib s/p MAZE, AVR (porcine), HTN.  He presented with urinary retention and worsening fatigue. He recently underwent left total knee replacement on 09/19/2021 due to history of left knee osteoarthritis. Upon returning home he continued to have difficulty voiding.  He stated this has happened in the past requiring Foley placement. He has also had ongoing significant swelling and bruising in the left knee. Hemoglobin on admission was 8.3 g/dL down from baseline of around 11 to 12 g/dL. He received 1 unit PRBC on admission due to symptomatic anemia. He was initially started on heparin drip while Lovenox and Coumadin were held.  Urine from Foley continued to have hematuria.  Case was discussed with urology and heparin was held with Foley catheter flushes ordered.  Urine cleared to clear yellow prior to discharge.  He was resumed back on Coumadin with Lovenox bridge at discharge.  Foley catheter continued at discharge and he will  follow-up outpatient with urology.  Assessment and Plan:  Urinary retention BPH -History of requiring Foley in the past.  He follows outpatient with urology typically - Continue Foley, retention is likely due to his weakness and deconditioning - Continue Flomax and Proscar -Outpatient follow-up with Dr. Jeffie Pollock   Hematuria - likely due to ongoing anticoagulation in setting of foley; some possible urethral trauma with insertion too -After heparin discontinued and flushes initiated, urine cleared with no further hematuria -Patient was then placed back on Coumadin with Lovenox bridge at discharge  Symptomatic anemia ABLA Thrombocytopenia - Suspected from recent surgery and underlying bruising - Underwent CT abdomen/pelvis and CTA left lower extremity.  No hematomas appreciated or active bleeding - 1 unit PRBC ordered on admission -Hemoglobin slowly improved with trending.  Up to 8.5 g/dL at discharge   Recent L TKA - appropriate bruising/swelling of L knee not unexpected - Hgb will slowly improve as well as swelling; no concerning findings on CTA or CT A/P -Seen by orthopedic surgery, appreciate evaluation  -Continue with outpatient follow-up planned   HLD CAD - continue home regimen   HTN - continue home regimen   Hx of CVA - continue home regimen   A fib s/p MAZE Hx of AVR (porcine) - on coumadin at home; was on lovenox bridge as well s/p L TKA   Hyperglycemia -A1c 5.5% - Continue diet control  The patient's chronic medical conditions were treated accordingly per the patient's home medication regimen except as noted.  On day of discharge, patient was felt deemed stable for discharge. Patient/family member advised to call PCP or come back to ER if needed.   Principal Diagnosis: Symptomatic anemia  Discharge Diagnoses: Active Hospital Problems   Diagnosis Date Noted   Symptomatic anemia 09/23/2021   Essential hypertension     Priority: 4.   CAD (coronary artery  disease) 12/19/2020    Priority: 5.   Atrial fibrillation ([place])     Priority: 5.   S/P AVR 08/03/2010    Priority: 6.   Benign prostatic hyperplasia with lower urinary tract symptoms     Priority: 9.   Thrombocytopenia ([place]) 05/26/2020    Priority: 10.   Hematuria 09/25/2021   HLD (hyperlipidemia) 09/23/2021   S/P total knee arthroplasty, left 09/19/2021   History of cerebrovascular accident 11/27/2016   Hyperglycemia     Resolved Hospital Problems  No resolved problems to display.     Discharge Instructions     Diet - low sodium heart healthy   Complete by: As directed    Increase activity slowly   Complete by: As directed    Leave dressing on - Keep it clean, dry, and intact until clinic visit   Complete by: As directed       Allergies as of 09/26/2021       Reactions   Sulfa Antibiotics Other (See Comments)   Granulocytosis   Zestril [lisinopril] Cough        Medication List     TAKE these medications    acetaminophen 500 MG tablet Commonly known as: TYLENOL Take 2 tablets (1,000 mg total) by mouth every 6 (six) hours.   allopurinol 300 MG tablet Commonly known as: ZYLOPRIM Take 150 mg by mouth every morning. Notes to patient: 09/27/2021    amLODipine 10 MG tablet Commonly known as: NORVASC TAKE 1 TABLET BY MOUTH  DAILY Notes to patient: 09/27/2021    amoxicillin 500 MG capsule Commonly known as: AMOXIL Take 2,000 mg by mouth See admin instructions. Take 4 capsules (2000 mg) by mouth 1 hour prior to dental appointment   atenolol 25 MG tablet Commonly known as: TENORMIN Take 0.5 tablets (12.5 mg total) by mouth daily. Notes to patient: 09/27/2021    Centrum Silver Adult 50+ Tabs Take 1 tablet by mouth daily. Notes to patient: 09/27/2021    cholecalciferol 25 MCG (1000 UNIT) tablet Commonly known as: VITAMIN D3 Take 1,000 Units by mouth daily. Notes to patient: 09/27/2021    Cinnamon 500 MG capsule Take 500 mg by mouth every morning. Notes to  patient: 09/27/2021    enoxaparin 80 MG/0.8ML injection Commonly known as: LOVENOX Inject 0.8 mLs (80 mg total) into the skin every 12 (twelve) hours. Notes to patient: 09/26/2021    feeding supplement Liqd Take 237 mLs by mouth 2 (two) times daily between meals. What changed: when to take this   ferrous sulfate 325 (65 FE) MG EC tablet Take 325 mg by mouth 2 (two) times daily. Notes to patient: 09/26/2021 evening dose    finasteride 5 MG tablet Commonly known as: PROSCAR Take 1 tablet (5 mg total) by mouth daily. Notes to patient: 09/27/2021    gabapentin 100 MG capsule Commonly known as: NEURONTIN Take 100-300 mg by mouth See admin instructions. Take 1 capsule (100 mg) by mouth in the morning and take 3 capsules (300 mg) at bedtime Notes to patient: 09/26/2021 bedtime    GLUCOSAMINE CHONDROIT-COLLAGEN PO Take 1 tablet by mouth 2 (two) times daily. Notes  to patient: 09/26/2021    oxyCODONE 5 MG immediate release tablet Commonly known as: Oxy IR/ROXICODONE Take 1 tablet (5 mg total) by mouth every 4 (four) hours as needed for severe pain.   rosuvastatin 10 MG tablet Commonly known as: CRESTOR Take 1 tablet (10 mg total) by mouth daily.   tamsulosin 0.4 MG Caps capsule Commonly known as: FLOMAX Take 2 capsules (0.8 mg total) by mouth daily after breakfast. What changed: when to take this Notes to patient: 09/26/2021    traZODone 50 MG tablet Commonly known as: DESYREL Take 50 mg by mouth at bedtime as needed for sleep.   warfarin 5 MG tablet Commonly known as: COUMADIN Take as directed. If you are unsure how to take this medication, talk to your nurse or doctor. Original instructions: Take 1 tablet (5 mg total) by mouth daily at 4 PM. Until seen by warfarin clinic. What changed:  how much to take when to take this additional instructions               Discharge Care Instructions  (From admission, onward)           Start     Ordered   09/26/21 0000   Leave dressing on - Keep it clean, dry, and intact until clinic visit        09/26/21 0954            Follow-up Information     Irine Seal, MD. Schedule an appointment as soon as possible for a visit in 1 week(s).   Specialty: Urology Contact information: [address] [place] [address] [phone]                Allergies  Allergen Reactions   Sulfa Antibiotics Other (See Comments)    Granulocytosis   Zestril [Lisinopril] Cough    Consultations:   Procedures:   Discharge Exam: BP 134/78 (BP Location: Right Arm)   Pulse 74   Temp 98.4 F (36.9 C) (Oral)   Resp 18   Ht 6' (1.829 m)   Wt 81.6 kg   SpO2 97%   BMI 24.41 kg/m  Physical Exam Constitutional:      General: He is not in acute distress.    Appearance: Normal appearance.  HENT:     Head: Normocephalic and atraumatic.     Mouth/Throat:     Mouth: Mucous membranes are moist.  Eyes:     Extraocular Movements: Extraocular movements intact.  Cardiovascular:     Rate and Rhythm: Normal rate and regular rhythm.     Heart sounds: Normal heart sounds.  Pulmonary:     Effort: Pulmonary effort is normal. No respiratory distress.     Breath sounds: Normal breath sounds. No wheezing.  Abdominal:     General: Bowel sounds are normal. There is no distension.     Palpations: Abdomen is soft.     Tenderness: There is no abdominal tenderness.  Genitourinary:    Comments: Foley in place now with clear yellow urine in bag Musculoskeletal:     Cervical back: Normal range of motion and neck supple.     Comments: Left knee noted with surgical dressing in place and surrounding large ecchymoses and swelling  Skin:    General: Skin is warm and dry.  Neurological:     General: No focal deficit present.     Mental Status: He is alert.  Psychiatric:        Mood and Affect: Mood normal.  Behavior: Behavior normal.      The results of significant diagnostics from this hospitalization (including  imaging, microbiology, ancillary and laboratory) are listed below for reference.   Microbiology: Recent Results (from the past 240 hour(s))  Urine Culture     Status: None   Collection Time: 09/23/21  8:45 AM   Specimen: Urine, Clean Catch  Result Value Ref Range Status   Specimen Description   Final    URINE, CLEAN CATCH Performed at [facility], [place] [address] [place], [place] [address]    Special Requests   Final    NONE Performed at [facility], [place] [address] [place], [place] [address]    Culture   Final    NO GROWTH Performed at [facility], [place] [address], [place], [place] [address]    Report Status 09/24/2021 FINAL  Final     Labs: BNP (last 3 results) No results for input(s): "BNP" in the last 8760 hours. Basic Metabolic Panel: Recent Labs  Lab 09/20/21 0326 09/23/21 0817 09/24/21 0058 09/25/21 0340 09/26/21 0403  NA 138 139 141 140 138  K 3.9 3.6 3.5 3.5 3.7  CL 109 108 112* 110 109  CO2 '22 22 24 24 23  '$ GLUCOSE 199* 137* 126* 125* 114*  BUN '13 20 13 12 15  '$ CREATININE 0.89 0.89 0.82 0.89 0.80  CALCIUM 8.4* 8.5* 8.2* 8.1* 8.3*  MG  --   --   --  2.4 2.1   Liver Function Tests: Recent Labs  Lab 09/24/21 0058  AST 27  ALT 21  ALKPHOS 33*  BILITOT 1.1  PROT 6.0*  ALBUMIN 3.2*   No results for input(s): "LIPASE", "AMYLASE" in the last 168 hours. No results for input(s): "AMMONIA" in the last 168 hours. CBC: Recent Labs  Lab 09/20/21 0326 09/23/21 0817 09/24/21 0058 09/24/21 0815 09/25/21 0340 09/26/21 0403  WBC 10.1 8.4 7.7  --  7.6 7.3  NEUTROABS  --  5.6  --   --   --   --   HGB 9.8* 8.3* 8.3* 9.6* 8.0* 8.5*  HCT 29.4* 25.3* 24.5* 29.9* 25.0* 26.3*  MCV 97.0 100.0 96.1  --  98.0 98.5  PLT 101* 110* 104*  --  124* 136*   Cardiac Enzymes: No results for input(s): "CKTOTAL", "CKMB", "CKMBINDEX", "TROPONINI" in the last 168 hours. BNP: Invalid input(s): "POCBNP" CBG: No results for  input(s): "GLUCAP" in the last 168 hours. D-Dimer No results for input(s): "DDIMER" in the last 72 hours. Hgb A1c Recent Labs    09/24/21 0058  HGBA1C 5.5   Lipid Profile No results for input(s): "CHOL", "HDL", "LDLCALC", "TRIG", "CHOLHDL", "LDLDIRECT" in the last 72 hours. Thyroid function studies No results for input(s): "TSH", "T4TOTAL", "T3FREE", "THYROIDAB" in the last 72 hours.  Invalid input(s): "FREET3" Anemia work up No results for input(s): "VITAMINB12", "FOLATE", "FERRITIN", "TIBC", "IRON", "RETICCTPCT" in the last 72 hours. Urinalysis    Component Value Date/Time   COLORURINE YELLOW 09/23/2021 0845   APPEARANCEUR HAZY (A) 09/23/2021 0845   LABSPEC 1.015 09/23/2021 0845   PHURINE 5.0 09/23/2021 0845   GLUCOSEU NEGATIVE 09/23/2021 0845   HGBUR LARGE (A) 09/23/2021 0845   BILIRUBINUR NEGATIVE 09/23/2021 0845   KETONESUR NEGATIVE 09/23/2021 0845   PROTEINUR 30 (A) 09/23/2021 0845   UROBILINOGEN 0.2 08/01/2010 0924   NITRITE NEGATIVE 09/23/2021 0845   LEUKOCYTESUR NEGATIVE 09/23/2021 0845   Sepsis Labs Recent Labs  Lab 09/23/21 0817 09/24/21 0058 09/25/21 0340 09/26/21  0403  WBC 8.4 7.7 7.6 7.3   Microbiology Recent Results (from the past 240 hour(s))  Urine Culture     Status: None   Collection Time: 09/23/21  8:45 AM   Specimen: Urine, Clean Catch  Result Value Ref Range Status   Specimen Description   Final    URINE, CLEAN CATCH Performed at [facility], [place] [address], [place], [place] [address]    Special Requests   Final    NONE Performed at [facility], [place] [address] [place], [place] [address]    Culture   Final    NO GROWTH Performed at [facility], [place] [address], [place], [place] [address]    Report Status 09/24/2021 FINAL  Final    Procedures/Studies: CT ANGIO LOW EXTREM LEFT W &/OR WO CONTRAST  Result Date: 09/23/2021 CLINICAL DATA:  Anemia. Left knee bruising and swelling after total  knee arthroplasty 3 days ago. EXAM: CT ANGIOGRAPHY OF THE LEFT LOWEREXTREMITY TECHNIQUE: Multidetector CT imaging of the left lowerwas performed using the standard protocol during bolus administration of intravenous contrast. Multiplanar CT image reconstructions and MIPs were obtained to evaluate the vascular anatomy. RADIATION DOSE REDUCTION: This exam was performed according to the departmental dose-optimization program which includes automated exposure control, adjustment of the mA and/or kV according to patient size and/or use of iterative reconstruction technique. CONTRAST:  167m OMNIPAQUE IOHEXOL 350 MG/ML SOLN COMPARISON:  None Available. FINDINGS: Bones/Joint/Cartilage Left total hip and knee arthroplasties. No evidence of hardware failure or loosening. No fracture or dislocation. Joint spaces are preserved. Small left knee joint effusion. Ligaments Ligaments are suboptimally evaluated by CT. Muscles and Tendons Expected intramuscular postsurgical change and subcutaneous emphysema in the distal left thigh and proximal lower leg. Soft tissue Expected postsurgical soft tissue swelling of the left thigh and lower leg. No fluid collection or hematoma. No soft tissue mass. Scattered atherosclerotic calcification within the arterial system of both legs without high-grade stenosis. Normal three-vessel runoff bilaterally. Review of the MIP images confirms the above findings. IMPRESSION: 1. Left total knee arthroplasty with expected postsurgical changes in the surrounding soft tissues. No hematoma. Electronically Signed   By: WTitus DubinM.D.   On: 09/23/2021 16:26   CT ABDOMEN PELVIS W CONTRAST  Result Date: 09/23/2021 CLINICAL DATA:  86year old male with trans urethral resection of the prostate 3 days ago. Unable to urinate now. EXAM: CT ABDOMEN AND PELVIS WITH CONTRAST TECHNIQUE: Multidetector CT imaging of the abdomen and pelvis was performed using the standard protocol following bolus administration of  intravenous contrast. RADIATION DOSE REDUCTION: This exam was performed according to the departmental dose-optimization program which includes automated exposure control, adjustment of the mA and/or kV according to patient size and/or use of iterative reconstruction technique. CONTRAST:  1053mOMNIPAQUE IOHEXOL 300 MG/ML  SOLN COMPARISON:  CT Abdomen and Pelvis 05/11/2016. FINDINGS: Lower chest: Prior sternotomy. Mild cardiomegaly. No pericardial or pleural effusion. Lung bases appear stable and negative. Hepatobiliary: Chronically absent gallbladder. Liver appears negative. Pancreas: Within normal limits. Spleen: Negative. Adrenals/Urinary Tract: Normal adrenal glands. Chronic right nephrolithiasis. Chronic exophytic but simple and benign appearing right renal lower pole cyst (no follow-up imaging recommended). No hydronephrosis. Symmetric renal enhancement and contrast excretion. No hydroureter. Urinary bladder is decompressed by a Foley catheter and contains a small volume of gas. Streak artifact from left hip arthroplasty limits detail of the base of the bladder and prostate. No adverse or complicating features are evident there. Stomach/Bowel: Retained stool  in the rectosigmoid colon. Severe diverticulosis of the proximal sigmoid and descending colon. No active inflammation identified. Mild retained stool in redundant large bowel elsewhere. Normal appendix on series 2, image 67. No large bowel inflammation. Decompressed terminal ileum. No dilated small bowel. Stomach and duodenum appear negative. No free air or free fluid. Vascular/Lymphatic: Aortoiliac calcified atherosclerosis. Major arterial structures in the abdomen and pelvis are patent. Chronic postoperative changes to the proximal right femoral vasculature. Portal venous system appears to be patent. There is some pelvic venous contrast also. Grossly patent upper femoral venous vasculature. No lymphadenopathy identified. Reproductive: Urethral catheter in  place. Streak artifact at the base of the bladder from left hip arthroplasty. Other: No pelvic free fluid is apparent. Musculoskeletal: Left hip arthroplasty is new since 2018. Associated pelvic streak artifact. Chronic T12-L1 and L1-L2 interbody ankylosis. No acute osseous abnormality identified. But there is nonspecific new proximal left thigh subcutaneous stranding and swelling on series 2, image 100. The proximal left femoral venous structures appear grossly patent there. No associated inguinal lymphadenopathy. No soft tissue gas. No fluid collection identified. IMPRESSION: 1. Left hip arthroplasty is new since 2018 with streak artifact limiting visualization of the base of the bladder and prostate. Urethral catheter in place decompressing the bladder. No adverse features identified following TURP. No hydronephrosis or hydroureter. Chronic right nephrolithiasis. 2. Partially visible generalized soft tissue swelling and inflammation in the proximal Left Thigh. Query Cellulitis. Grossly patent visible left Common femoral vein. 3. No other acute or inflammatory process identified in the abdomen or pelvis. Extensive diverticulosis of the distal large bowel. Aortic Atherosclerosis (ICD10-I70.0). Electronically Signed   By: Genevie Ann M.D.   On: 09/23/2021 10:41     Time coordinating discharge: Over 30 minutes    Dwyane Dee, MD  [facility] 09/26/2021, 1:54 PM

## 2021-09-26 NOTE — Plan of Care (Signed)
Discharge instructions reviewed with patient and wife, questions answered, verbalized understanding.  Patient given morning dose of Lovenox prior to discharge.  Patient transported to main entrance via wheelchair to be taken home by wife.

## 2021-09-26 NOTE — Telephone Encounter (Signed)
Returned pt's call. Refer to anticoagulation encounter

## 2021-09-28 DIAGNOSIS — M25562 Pain in left knee: Secondary | ICD-10-CM | POA: Diagnosis not present

## 2021-09-28 NOTE — Discharge Summary (Signed)
Patient ID: [name]. MRN: [id number] DOB/AGE: Jan 14, 1935 86 y.o.  Admit date: 09/19/2021 Discharge date: 09/20/2021  Admission Diagnoses:  Left knee osteoarthritis  Discharge Diagnoses:  Principal Problem:   S/P total knee arthroplasty, left   Past Medical History:  Diagnosis Date   Acquired dilation of ascending aorta and aortic root ([place])    61m aortic root and 460mascending aorta on echo 03/2020   Arthritis    Bladder stones    Borderline diabetes    BPH (benign prostatic hyperplasia)    CAD (coronary artery disease) 12/19/2020   Non-obstructive coronary artery disease at cath in 2019   Coronary artery disease    cardiologist-  dr skMarlou PorchloCecille Rubinerhart NP--- per cath 06-02-2010 non-obstructive cad pLAD 30-40%   Diverticulosis of colon    Dysrhythmia    afib   Gout    Heart murmur    History of adenomatous polyp of colon    2002-- tubular adenoma   History of aortic insufficiency    severe -- s/p  AVR 08-03-2010   History of small bowel obstruction    02/ 2007 mechanical sbo s/p  surgical intervention;  partial sbo 09/ 2011 and 03-20-2011 resolved without surgical intervention   History of urinary retention    HTN (hypertension)    Peripheral neuropathy    Persistent atrial fibrillation (HCC)    S/P aortic valve replacement with prosthetic valve 08/03/2010   tissue valve   S/P Maze operation for atrial fibrillation 01/30/2018   Complete bilateral atrial lesion set using bipolar radiofrequency and cryothermy with clipping of LA appendage   S/P MVR (mitral valve repair) 01/30/2018   Complex valvuloplasty including artificial Gore-tex neochord placement x4 and Carbo medics Annuloflex ring annuloplasty, size 28   S/P patent foramen ovale closure 08/03/2010   at same time AVR   S/P tricuspid valve repair 01/30/2018   Using an MC3 Annuloplasty ring, size 28   [facility]   Tricuspid regurgitation     Surgeries: Procedure(s): TOTAL KNEE ARTHROPLASTY on  09/19/2021   Consultants:   Discharged Condition: Improved  Hospital Course: WiMaverik Ayers an [age].o. male who was admitted 09/19/2021 for operative treatment ofS/P total knee arthroplasty, left. Patient has severe unremitting pain that affects sleep, daily activities, and work/hobbies. After pre-op clearance the patient was taken to the operating room on 09/19/2021 and underwent  Procedure(s): TOTAL KNEE ARTHROPLASTY.    Patient was given perioperative antibiotics:  Anti-infectives (From admission, onward)    Start     Dose/Rate Route Frequency Ordered Stop   09/19/21 2000  ceFAZolin (ANCEF) IVPB [name]        2 g 200 mL/hr over 30 Minutes Intravenous Every 6 hours 09/19/21 1722 09/20/21 0157   09/19/21 1030  ceFAZolin (ANCEF) IVPB [name]        2 g 200 mL/hr over 30 Minutes Intravenous On call to O.R. 09/19/21 1023 09/19/21 1326        Patient was given sequential compression devices, early ambulation, and chemoprophylaxis to prevent DVT. Patient worked with PT and was meeting their goals regarding safe ambulation and transfers.  Patient benefited maximally from hospital stay and there were no complications.    Recent vital signs: No data found.   Recent laboratory studies:  Recent Labs    09/26/21 0403  WBC 7.3  HGB 8.5*  HCT 26.3*  PLT 136*  NA 138  K 3.7  CL 109  CO2 23  BUN 15  CREATININE 0.80  GLUCOSE 114*  INR 1.3*  CALCIUM 8.3*     Discharge Medications:   Allergies as of 09/20/2021       Reactions   Sulfa Antibiotics Other (See Comments)   Granulocytosis   Lisinopril Cough        Medication List     TAKE these medications    acetaminophen 500 MG tablet Commonly known as: TYLENOL Take 2 tablets (1,000 mg total) by mouth every 6 (six) hours.   allopurinol 300 MG tablet Commonly known as: ZYLOPRIM Take 150 mg by mouth every morning.   amLODipine 10 MG tablet Commonly known as: NORVASC TAKE 1 TABLET BY MOUTH   DAILY   amoxicillin 500 MG capsule Commonly known as: AMOXIL Take 2,000 mg by mouth See admin instructions. Take 4 capsules (2000 mg) by mouth 1 hour prior to dental appointment   atenolol 25 MG tablet Commonly known as: TENORMIN Take 0.5 tablets (12.5 mg total) by mouth daily.   Centrum Silver Adult 50+ Tabs Take 1 tablet by mouth daily.   cholecalciferol 25 MCG (1000 UNIT) tablet Commonly known as: VITAMIN D3 Take 1,000 Units by mouth daily.   Cinnamon 500 MG capsule Take 500 mg by mouth every morning.   enoxaparin 80 MG/0.8ML injection Commonly known as: LOVENOX Inject 0.8 mLs (80 mg total) into the skin every 12 (twelve) hours.   feeding supplement Liqd Take 237 mLs by mouth 2 (two) times daily between meals. What changed: when to take this   ferrous sulfate 325 (65 FE) MG EC tablet Take 325 mg by mouth 2 (two) times daily.   finasteride 5 MG tablet Commonly known as: PROSCAR Take 1 tablet (5 mg total) by mouth daily.   gabapentin 100 MG capsule Commonly known as: NEURONTIN Take 100-300 mg by mouth See admin instructions. Take 1 capsule (100 mg) by mouth in the morning and take 3 capsules (300 mg) at bedtime   GLUCOSAMINE CHONDROIT-COLLAGEN PO Take 1 tablet by mouth 2 (two) times daily.   oxyCODONE 5 MG immediate release tablet Commonly known as: Oxy IR/ROXICODONE Take 1 tablet (5 mg total) by mouth every 4 (four) hours as needed for severe pain.   rosuvastatin 10 MG tablet Commonly known as: CRESTOR Take 1 tablet (10 mg total) by mouth daily.   tamsulosin 0.4 MG Caps capsule Commonly known as: FLOMAX Take 2 capsules (0.8 mg total) by mouth daily after breakfast. What changed: when to take this   traZODone 50 MG tablet Commonly known as: DESYREL Take 50 mg by mouth at bedtime as needed for sleep.   warfarin 5 MG tablet Commonly known as: COUMADIN Take as directed. If you are unsure how to take this medication, talk to your nurse or doctor. Original  instructions: Take 1 tablet (5 mg total) by mouth daily at 4 PM. Until seen by warfarin clinic. What changed:  how much to take when to take this additional instructions               Discharge Care Instructions  (From admission, onward)           Start     Ordered   09/20/21 0000  Change dressing       Comments: Maintain surgical dressing until follow up in the clinic. If the edges start to pull up, may reinforce with tape. If the dressing is no longer working, may remove and cover with gauze and tape, but must keep  the area dry and clean.  Call with any questions or concerns.   09/20/21 0719            Diagnostic Studies: CT ANGIO LOW EXTREM LEFT W &/OR WO CONTRAST  Result Date: 09/23/2021 CLINICAL DATA:  Anemia. Left knee bruising and swelling after total knee arthroplasty 3 days ago. EXAM: CT ANGIOGRAPHY OF THE LEFT LOWEREXTREMITY TECHNIQUE: Multidetector CT imaging of the left lowerwas performed using the standard protocol during bolus administration of intravenous contrast. Multiplanar CT image reconstructions and MIPs were obtained to evaluate the vascular anatomy. RADIATION DOSE REDUCTION: This exam was performed according to the departmental dose-optimization program which includes automated exposure control, adjustment of the mA and/or kV according to patient size and/or use of iterative reconstruction technique. CONTRAST:  158m OMNIPAQUE IOHEXOL 350 MG/ML SOLN COMPARISON:  None Available. FINDINGS: Bones/Joint/Cartilage Left total hip and knee arthroplasties. No evidence of hardware failure or loosening. No fracture or dislocation. Joint spaces are preserved. Small left knee joint effusion. Ligaments Ligaments are suboptimally evaluated by CT. Muscles and Tendons Expected intramuscular postsurgical change and subcutaneous emphysema in the distal left thigh and proximal lower leg. Soft tissue Expected postsurgical soft tissue swelling of the left thigh and lower leg. No  fluid collection or hematoma. No soft tissue mass. Scattered atherosclerotic calcification within the arterial system of both legs without high-grade stenosis. Normal three-vessel runoff bilaterally. Review of the MIP images confirms the above findings. IMPRESSION: 1. Left total knee arthroplasty with expected postsurgical changes in the surrounding soft tissues. No hematoma. Electronically Signed   By: WTitus DubinM.D.   On: 09/23/2021 16:26   CT ABDOMEN PELVIS W CONTRAST  Result Date: 09/23/2021 CLINICAL DATA:  86year old male with trans urethral resection of the prostate 3 days ago. Unable to urinate now. EXAM: CT ABDOMEN AND PELVIS WITH CONTRAST TECHNIQUE: Multidetector CT imaging of the abdomen and pelvis was performed using the standard protocol following bolus administration of intravenous contrast. RADIATION DOSE REDUCTION: This exam was performed according to the departmental dose-optimization program which includes automated exposure control, adjustment of the mA and/or kV according to patient size and/or use of iterative reconstruction technique. CONTRAST:  1038mOMNIPAQUE IOHEXOL 300 MG/ML  SOLN COMPARISON:  CT Abdomen and Pelvis 05/11/2016. FINDINGS: Lower chest: Prior sternotomy. Mild cardiomegaly. No pericardial or pleural effusion. Lung bases appear stable and negative. Hepatobiliary: Chronically absent gallbladder. Liver appears negative. Pancreas: Within normal limits. Spleen: Negative. Adrenals/Urinary Tract: Normal adrenal glands. Chronic right nephrolithiasis. Chronic exophytic but simple and benign appearing right renal lower pole cyst (no follow-up imaging recommended). No hydronephrosis. Symmetric renal enhancement and contrast excretion. No hydroureter. Urinary bladder is decompressed by a Foley catheter and contains a small volume of gas. Streak artifact from left hip arthroplasty limits detail of the base of the bladder and prostate. No adverse or complicating features are evident  there. Stomach/Bowel: Retained stool in the rectosigmoid colon. Severe diverticulosis of the proximal sigmoid and descending colon. No active inflammation identified. Mild retained stool in redundant large bowel elsewhere. Normal appendix on series 2, image 67. No large bowel inflammation. Decompressed terminal ileum. No dilated small bowel. Stomach and duodenum appear negative. No free air or free fluid. Vascular/Lymphatic: Aortoiliac calcified atherosclerosis. Major arterial structures in the abdomen and pelvis are patent. Chronic postoperative changes to the proximal right femoral vasculature. Portal venous system appears to be patent. There is some pelvic venous contrast also. Grossly patent upper femoral venous vasculature. No lymphadenopathy identified. Reproductive: Urethral catheter in place. Streak  artifact at the base of the bladder from left hip arthroplasty. Other: No pelvic free fluid is apparent. Musculoskeletal: Left hip arthroplasty is new since 2018. Associated pelvic streak artifact. Chronic T12-L1 and L1-L2 interbody ankylosis. No acute osseous abnormality identified. But there is nonspecific new proximal left thigh subcutaneous stranding and swelling on series 2, image 100. The proximal left femoral venous structures appear grossly patent there. No associated inguinal lymphadenopathy. No soft tissue gas. No fluid collection identified. IMPRESSION: 1. Left hip arthroplasty is new since 2018 with streak artifact limiting visualization of the base of the bladder and prostate. Urethral catheter in place decompressing the bladder. No adverse features identified following TURP. No hydronephrosis or hydroureter. Chronic right nephrolithiasis. 2. Partially visible generalized soft tissue swelling and inflammation in the proximal Left Thigh. Query Cellulitis. Grossly patent visible left Common femoral vein. 3. No other acute or inflammatory process identified in the abdomen or pelvis. Extensive  diverticulosis of the distal large bowel. Aortic Atherosclerosis (ICD10-I70.0). Electronically Signed   By: Genevie Ann M.D.   On: 09/23/2021 10:41    Disposition: Discharge disposition: 01-Home or Self Care       Discharge Instructions     Call MD / Call 911   Complete by: As directed    If you experience chest pain or shortness of breath, CALL 911 and be transported to the hospital emergency room.  If you develope a fever above 101 F, pus (white drainage) or increased drainage or redness at the wound, or calf pain, call your surgeon's office.   Change dressing   Complete by: As directed    Maintain surgical dressing until follow up in the clinic. If the edges start to pull up, may reinforce with tape. If the dressing is no longer working, may remove and cover with gauze and tape, but must keep the area dry and clean.  Call with any questions or concerns.   Constipation Prevention   Complete by: As directed    Drink plenty of fluids.  Prune juice may be helpful.  You may use a stool softener, such as Colace (over the counter) 100 mg twice a day.  Use MiraLax (over the counter) for constipation as needed.   Diet - low sodium heart healthy   Complete by: As directed    Increase activity slowly as tolerated   Complete by: As directed    Weight bearing as tolerated with assist device (walker, cane, etc) as directed, use it as long as suggested by your surgeon or therapist, typically at least 4-6 weeks.   Post-operative opioid taper instructions:   Complete by: As directed    POST-OPERATIVE OPIOID TAPER INSTRUCTIONS: It is important to wean off of your opioid medication as soon as possible. If you do not need pain medication after your surgery it is ok to stop day one. Opioids include: Codeine, Hydrocodone(Norco, Vicodin), Oxycodone(Percocet, oxycontin) and hydromorphone amongst others.  Long term and even short term use of opiods can cause: Increased pain  response Dependence Constipation Depression Respiratory depression And more.  Withdrawal symptoms can include Flu like symptoms Nausea, vomiting And more Techniques to manage these symptoms Hydrate well Eat regular healthy meals Stay active Use relaxation techniques(deep breathing, meditating, yoga) Do Not substitute Alcohol to help with tapering If you have been on opioids for less than two weeks and do not have pain than it is ok to stop all together.  Plan to wean off of opioids This plan should start within one week post  op of your joint replacement. Maintain the same interval or time between taking each dose and first decrease the dose.  Cut the total daily intake of opioids by one tablet each day Next start to increase the time between doses. The last dose that should be eliminated is the evening dose.      TED hose   Complete by: As directed    Use stockings (TED hose) for 2 weeks on both leg(s).  You may remove them at night for sleeping.        Follow-up Information     Irving Copas, PA-C. Go on 10/04/2021.   Specialty: Orthopedic Surgery Why: You are scheduled for a follow up appointment on 10-04-21 at 10:30 am. Contact information: [address] [place] [address] [phone]                  Signed: Irving Copas 09/28/2021, 7:18 AM

## 2021-10-03 ENCOUNTER — Ambulatory Visit: Payer: Medicare Other | Attending: Cardiology

## 2021-10-03 DIAGNOSIS — Z5181 Encounter for therapeutic drug level monitoring: Secondary | ICD-10-CM

## 2021-10-03 DIAGNOSIS — Z7901 Long term (current) use of anticoagulants: Secondary | ICD-10-CM

## 2021-10-03 DIAGNOSIS — I639 Cerebral infarction, unspecified: Secondary | ICD-10-CM

## 2021-10-03 DIAGNOSIS — I4891 Unspecified atrial fibrillation: Secondary | ICD-10-CM

## 2021-10-03 DIAGNOSIS — Z9889 Other specified postprocedural states: Secondary | ICD-10-CM

## 2021-10-03 LAB — POCT INR: INR: 1.8 — AB (ref 2.0–3.0)

## 2021-10-03 NOTE — Patient Instructions (Signed)
TAKE 1.5 TABLETS TODAY ONLY and then start taking warfarin 1 tablet daily except for 1.5 tablets on Monday and Friday  Recheck INR in 2 weeks. [facility] [phone].

## 2021-10-05 DIAGNOSIS — M25562 Pain in left knee: Secondary | ICD-10-CM | POA: Diagnosis not present

## 2021-10-06 DIAGNOSIS — R3129 Other microscopic hematuria: Secondary | ICD-10-CM | POA: Diagnosis not present

## 2021-10-06 DIAGNOSIS — D649 Anemia, unspecified: Secondary | ICD-10-CM | POA: Diagnosis not present

## 2021-10-06 DIAGNOSIS — R339 Retention of urine, unspecified: Secondary | ICD-10-CM | POA: Diagnosis not present

## 2021-10-06 DIAGNOSIS — K5909 Other constipation: Secondary | ICD-10-CM | POA: Diagnosis not present

## 2021-10-06 DIAGNOSIS — Z6823 Body mass index (BMI) 23.0-23.9, adult: Secondary | ICD-10-CM | POA: Diagnosis not present

## 2021-10-10 DIAGNOSIS — M25562 Pain in left knee: Secondary | ICD-10-CM | POA: Diagnosis not present

## 2021-10-11 ENCOUNTER — Other Ambulatory Visit: Payer: Self-pay | Admitting: Urology

## 2021-10-13 DIAGNOSIS — M25562 Pain in left knee: Secondary | ICD-10-CM | POA: Diagnosis not present

## 2021-10-16 ENCOUNTER — Encounter: Payer: Self-pay | Admitting: Nurse Practitioner

## 2021-10-16 ENCOUNTER — Inpatient Hospital Stay: Payer: Medicare Other | Attending: Nurse Practitioner | Admitting: Nurse Practitioner

## 2021-10-16 ENCOUNTER — Inpatient Hospital Stay: Payer: Medicare Other

## 2021-10-16 VITALS — BP 116/71 | HR 82 | Temp 98.2°F | Resp 18 | Ht 72.0 in | Wt 173.2 lb

## 2021-10-16 DIAGNOSIS — D509 Iron deficiency anemia, unspecified: Secondary | ICD-10-CM | POA: Diagnosis not present

## 2021-10-16 DIAGNOSIS — D649 Anemia, unspecified: Secondary | ICD-10-CM | POA: Diagnosis not present

## 2021-10-16 DIAGNOSIS — Z8781 Personal history of (healed) traumatic fracture: Secondary | ICD-10-CM | POA: Diagnosis not present

## 2021-10-16 DIAGNOSIS — I2510 Atherosclerotic heart disease of native coronary artery without angina pectoris: Secondary | ICD-10-CM | POA: Insufficient documentation

## 2021-10-16 DIAGNOSIS — I4891 Unspecified atrial fibrillation: Secondary | ICD-10-CM | POA: Diagnosis not present

## 2021-10-16 DIAGNOSIS — R319 Hematuria, unspecified: Secondary | ICD-10-CM | POA: Insufficient documentation

## 2021-10-16 DIAGNOSIS — R0609 Other forms of dyspnea: Secondary | ICD-10-CM | POA: Diagnosis not present

## 2021-10-16 DIAGNOSIS — Z7901 Long term (current) use of anticoagulants: Secondary | ICD-10-CM | POA: Insufficient documentation

## 2021-10-16 LAB — CBC WITH DIFFERENTIAL (CANCER CENTER ONLY)
Abs Immature Granulocytes: 0.01 10*3/uL (ref 0.00–0.07)
Basophils Absolute: 0 10*3/uL (ref 0.0–0.1)
Basophils Relative: 0 %
Eosinophils Absolute: 0.1 10*3/uL (ref 0.0–0.5)
Eosinophils Relative: 2 %
HCT: 31.7 % — ABNORMAL LOW (ref 39.0–52.0)
Hemoglobin: 10.1 g/dL — ABNORMAL LOW (ref 13.0–17.0)
Immature Granulocytes: 0 %
Lymphocytes Relative: 22 %
Lymphs Abs: 1.3 10*3/uL (ref 0.7–4.0)
MCH: 31.4 pg (ref 26.0–34.0)
MCHC: 31.9 g/dL (ref 30.0–36.0)
MCV: 98.4 fL (ref 80.0–100.0)
Monocytes Absolute: 0.6 10*3/uL (ref 0.1–1.0)
Monocytes Relative: 10 %
Neutro Abs: 4 10*3/uL (ref 1.7–7.7)
Neutrophils Relative %: 66 %
Platelet Count: 197 10*3/uL (ref 150–400)
RBC: 3.22 MIL/uL — ABNORMAL LOW (ref 4.22–5.81)
RDW: 16 % — ABNORMAL HIGH (ref 11.5–15.5)
WBC Count: 6.1 10*3/uL (ref 4.0–10.5)
nRBC: 0 % (ref 0.0–0.2)

## 2021-10-16 LAB — FERRITIN: Ferritin: 206 ng/mL (ref 24–336)

## 2021-10-16 NOTE — Progress Notes (Signed)
  Center OFFICE PROGRESS NOTE   Diagnosis: Anemia  INTERVAL HISTORY:   [name] returns as scheduled.  He underwent a left total knee replacement 09/19/2021.  Hemoglobin prior to surgery returned at 12.4.  On 09/20/2021 hemoglobin returned at 9.8.  He was readmitted 09/23/2021 with difficulty urinating.  A Foley catheter was placed.  He developed hematuria felt to likely be due to anticoagulation and possible urethral trauma.  The urine cleared.  At discharge he was placed back on Coumadin with Lovenox bridge.  He is taking oral iron twice a day.  No frequent constipation or nausea.  He would like to increase the oral iron to 3 times a day.  He notes energy is slowly improving.  He has dyspnea on exertion with more extreme activity.  He is able to walk around his house without difficulty.  He denies bleeding.  Objective:  Vital signs in last 24 hours:  Blood pressure 116/71, pulse 82, temperature 98.2 F (36.8 C), temperature source Oral, resp. rate 18, height 6' (1.829 m), weight 173 lb 3.2 oz (78.6 kg), SpO2 98 %.    HEENT: No thrush or ulcers. Resp: Lungs clear bilaterally. Cardio: Regular rate and rhythm. GI: Abdomen soft and nontender.  No hepatosplenomegaly. Vascular: Trace edema left lower leg. Musculoskeletal: Left knee is edematous.   Lab Results:  Lab Results  Component Value Date   WBC 6.1 10/16/2021   HGB 10.1 (L) 10/16/2021   HCT 31.7 (L) 10/16/2021   MCV 98.4 10/16/2021   PLT 197 10/16/2021   NEUTROABS 4.0 10/16/2021    Imaging:  No results found.  Medications: I have reviewed the patient's current medications.  Assessment/Plan: Anemia Normal ferritin, normal vitamin B12, and negative stool Hemoccult cards January 2023 Left hip fracture July 2022 status post left hip replacement  Hospitalization August 2020 with symptomatic anemia, heme positive stool, ferritin 19, status post EGD;  colonoscopy 10/21/2018, gastritis with  hemorrhage Atrial fibrillation on Coumadin CAD AVR Mitral valve repair Left total knee replacement 09/19/2021 Admission with urinary retention 09/23/2021    Disposition: [name] appears stable.  Prior to the knee surgery 09/19/2021 hemoglobin was at baseline.  Following surgery hemoglobin declined to 9.8.  He was subsequently admitted with urinary retention, hematuria.  Hemoglobin declined into the 8 range.  He received a blood transfusion.  At discharge 3 weeks ago hemoglobin was 8.5.  We reviewed the CBC from today.  Hemoglobin is 10.1.  [name] is recovering from the 2 recent hospitalizations, surgery.  Hemoglobin is improving.  He will continue oral iron.  Repeat CBC in 2 months.  We will see him in follow-up in 4 months.    [name] ANP/GNP-BC   10/16/2021  2:02 PM

## 2021-10-17 ENCOUNTER — Ambulatory Visit: Payer: Medicare Other | Attending: Cardiology

## 2021-10-17 DIAGNOSIS — M25562 Pain in left knee: Secondary | ICD-10-CM | POA: Diagnosis not present

## 2021-10-17 DIAGNOSIS — Z5181 Encounter for therapeutic drug level monitoring: Secondary | ICD-10-CM

## 2021-10-17 DIAGNOSIS — I4891 Unspecified atrial fibrillation: Secondary | ICD-10-CM

## 2021-10-17 DIAGNOSIS — I639 Cerebral infarction, unspecified: Secondary | ICD-10-CM | POA: Diagnosis not present

## 2021-10-17 LAB — POCT INR: INR: 3.8 — AB (ref 2.0–3.0)

## 2021-10-17 NOTE — Patient Instructions (Signed)
HOLD TOMORROW'S DOSE ONLY and then start taking warfarin 1 tablet daily except for 1.5 tablets on Monday and Friday  Recheck INR in 1 weeks. [facility] [phone].

## 2021-10-18 DIAGNOSIS — R338 Other retention of urine: Secondary | ICD-10-CM | POA: Diagnosis not present

## 2021-10-20 ENCOUNTER — Inpatient Hospital Stay: Payer: Medicare Other | Admitting: Nurse Practitioner

## 2021-10-20 ENCOUNTER — Inpatient Hospital Stay: Payer: Medicare Other

## 2021-10-20 DIAGNOSIS — M25562 Pain in left knee: Secondary | ICD-10-CM | POA: Diagnosis not present

## 2021-10-25 DIAGNOSIS — M25562 Pain in left knee: Secondary | ICD-10-CM | POA: Diagnosis not present

## 2021-10-26 ENCOUNTER — Ambulatory Visit: Payer: Medicare Other | Attending: Cardiology

## 2021-10-26 DIAGNOSIS — Z5181 Encounter for therapeutic drug level monitoring: Secondary | ICD-10-CM

## 2021-10-26 DIAGNOSIS — I639 Cerebral infarction, unspecified: Secondary | ICD-10-CM | POA: Diagnosis not present

## 2021-10-26 DIAGNOSIS — I4891 Unspecified atrial fibrillation: Secondary | ICD-10-CM | POA: Diagnosis not present

## 2021-10-26 LAB — POCT INR: INR: 2.5 (ref 2.0–3.0)

## 2021-10-26 NOTE — Patient Instructions (Signed)
Continue 1 tablet daily except for 1.5 tablets on Monday and Friday  Recheck INR in 4 weeks. Coumadin Clinic [phone].

## 2021-10-27 ENCOUNTER — Other Ambulatory Visit: Payer: Self-pay | Admitting: Physician Assistant

## 2021-10-27 DIAGNOSIS — M25562 Pain in left knee: Secondary | ICD-10-CM | POA: Diagnosis not present

## 2021-11-01 DIAGNOSIS — Z471 Aftercare following joint replacement surgery: Secondary | ICD-10-CM | POA: Diagnosis not present

## 2021-11-01 DIAGNOSIS — R3914 Feeling of incomplete bladder emptying: Secondary | ICD-10-CM | POA: Diagnosis not present

## 2021-11-01 DIAGNOSIS — Z96652 Presence of left artificial knee joint: Secondary | ICD-10-CM | POA: Diagnosis not present

## 2021-11-02 DIAGNOSIS — R338 Other retention of urine: Secondary | ICD-10-CM | POA: Diagnosis not present

## 2021-11-03 DIAGNOSIS — M25562 Pain in left knee: Secondary | ICD-10-CM | POA: Diagnosis not present

## 2021-11-07 DIAGNOSIS — R4781 Slurred speech: Secondary | ICD-10-CM | POA: Diagnosis not present

## 2021-11-07 DIAGNOSIS — I499 Cardiac arrhythmia, unspecified: Secondary | ICD-10-CM | POA: Diagnosis not present

## 2021-11-07 DIAGNOSIS — Z743 Need for continuous supervision: Secondary | ICD-10-CM | POA: Diagnosis not present

## 2021-11-07 DIAGNOSIS — R6889 Other general symptoms and signs: Secondary | ICD-10-CM | POA: Diagnosis not present

## 2021-11-07 DIAGNOSIS — I4891 Unspecified atrial fibrillation: Secondary | ICD-10-CM | POA: Diagnosis not present

## 2021-11-08 ENCOUNTER — Other Ambulatory Visit: Payer: Self-pay

## 2021-11-08 ENCOUNTER — Emergency Department (HOSPITAL_COMMUNITY): Payer: Medicare Other

## 2021-11-08 ENCOUNTER — Inpatient Hospital Stay (HOSPITAL_COMMUNITY)
Admission: EM | Admit: 2021-11-08 | Discharge: 2021-11-12 | DRG: 178 | Disposition: A | Discharge status: Home Health | Payer: Medicare Other | Attending: Family Medicine | Admitting: Family Medicine

## 2021-11-08 ENCOUNTER — Encounter (HOSPITAL_COMMUNITY): Payer: Self-pay

## 2021-11-08 ENCOUNTER — Emergency Department (HOSPITAL_COMMUNITY)
Admission: EM | Admit: 2021-11-08 | Discharge: 2021-11-08 | Disposition: A | Discharge status: Home / Self Care | Payer: Medicare Other | Source: Home / Self Care | Attending: Student | Admitting: Student

## 2021-11-08 DIAGNOSIS — Z96642 Presence of left artificial hip joint: Secondary | ICD-10-CM | POA: Diagnosis present

## 2021-11-08 DIAGNOSIS — Z952 Presence of prosthetic heart valve: Secondary | ICD-10-CM | POA: Diagnosis not present

## 2021-11-08 DIAGNOSIS — N390 Urinary tract infection, site not specified: Secondary | ICD-10-CM | POA: Diagnosis not present

## 2021-11-08 DIAGNOSIS — D696 Thrombocytopenia, unspecified: Secondary | ICD-10-CM | POA: Diagnosis not present

## 2021-11-08 DIAGNOSIS — I4891 Unspecified atrial fibrillation: Secondary | ICD-10-CM | POA: Diagnosis present

## 2021-11-08 DIAGNOSIS — N400 Enlarged prostate without lower urinary tract symptoms: Secondary | ICD-10-CM | POA: Diagnosis present

## 2021-11-08 DIAGNOSIS — N3000 Acute cystitis without hematuria: Secondary | ICD-10-CM | POA: Insufficient documentation

## 2021-11-08 DIAGNOSIS — Z0389 Encounter for observation for other suspected diseases and conditions ruled out: Secondary | ICD-10-CM | POA: Diagnosis not present

## 2021-11-08 DIAGNOSIS — I2510 Atherosclerotic heart disease of native coronary artery without angina pectoris: Secondary | ICD-10-CM | POA: Diagnosis not present

## 2021-11-08 DIAGNOSIS — Z8249 Family history of ischemic heart disease and other diseases of the circulatory system: Secondary | ICD-10-CM | POA: Diagnosis not present

## 2021-11-08 DIAGNOSIS — D638 Anemia in other chronic diseases classified elsewhere: Secondary | ICD-10-CM | POA: Diagnosis not present

## 2021-11-08 DIAGNOSIS — I4821 Permanent atrial fibrillation: Secondary | ICD-10-CM | POA: Diagnosis not present

## 2021-11-08 DIAGNOSIS — R339 Retention of urine, unspecified: Secondary | ICD-10-CM | POA: Insufficient documentation

## 2021-11-08 DIAGNOSIS — Z888 Allergy status to other drugs, medicaments and biological substances status: Secondary | ICD-10-CM

## 2021-11-08 DIAGNOSIS — Z882 Allergy status to sulfonamides status: Secondary | ICD-10-CM | POA: Diagnosis not present

## 2021-11-08 DIAGNOSIS — Z7901 Long term (current) use of anticoagulants: Secondary | ICD-10-CM

## 2021-11-08 DIAGNOSIS — M109 Gout, unspecified: Secondary | ICD-10-CM | POA: Diagnosis not present

## 2021-11-08 DIAGNOSIS — R531 Weakness: Secondary | ICD-10-CM | POA: Diagnosis not present

## 2021-11-08 DIAGNOSIS — U071 COVID-19: Secondary | ICD-10-CM | POA: Diagnosis not present

## 2021-11-08 DIAGNOSIS — W19XXXA Unspecified fall, initial encounter: Secondary | ICD-10-CM | POA: Insufficient documentation

## 2021-11-08 DIAGNOSIS — Z953 Presence of xenogenic heart valve: Secondary | ICD-10-CM

## 2021-11-08 DIAGNOSIS — Z8774 Personal history of (corrected) congenital malformations of heart and circulatory system: Secondary | ICD-10-CM | POA: Diagnosis not present

## 2021-11-08 DIAGNOSIS — R791 Abnormal coagulation profile: Secondary | ICD-10-CM | POA: Insufficient documentation

## 2021-11-08 DIAGNOSIS — I10 Essential (primary) hypertension: Secondary | ICD-10-CM | POA: Diagnosis present

## 2021-11-08 DIAGNOSIS — I498 Other specified cardiac arrhythmias: Secondary | ICD-10-CM | POA: Insufficient documentation

## 2021-11-08 DIAGNOSIS — I69354 Hemiplegia and hemiparesis following cerebral infarction affecting left non-dominant side: Secondary | ICD-10-CM

## 2021-11-08 DIAGNOSIS — Z96652 Presence of left artificial knee joint: Secondary | ICD-10-CM | POA: Diagnosis present

## 2021-11-08 DIAGNOSIS — I6789 Other cerebrovascular disease: Secondary | ICD-10-CM | POA: Diagnosis not present

## 2021-11-08 DIAGNOSIS — Z9889 Other specified postprocedural states: Secondary | ICD-10-CM

## 2021-11-08 DIAGNOSIS — Z79899 Other long term (current) drug therapy: Secondary | ICD-10-CM | POA: Diagnosis not present

## 2021-11-08 DIAGNOSIS — Z960 Presence of urogenital implants: Secondary | ICD-10-CM

## 2021-11-08 DIAGNOSIS — M79605 Pain in left leg: Secondary | ICD-10-CM | POA: Insufficient documentation

## 2021-11-08 DIAGNOSIS — G319 Degenerative disease of nervous system, unspecified: Secondary | ICD-10-CM | POA: Diagnosis not present

## 2021-11-08 LAB — BASIC METABOLIC PANEL
Anion gap: 7 (ref 5–15)
BUN: 15 mg/dL (ref 8–23)
CO2: 24 mmol/L (ref 22–32)
Calcium: 9.2 mg/dL (ref 8.9–10.3)
Chloride: 109 mmol/L (ref 98–111)
Creatinine, Ser: 0.98 mg/dL (ref 0.61–1.24)
GFR, Estimated: >60 mL/min (ref >60)
Glucose, Bld: 118 mg/dL — ABNORMAL HIGH (ref 70–99)
Potassium: 3.9 mmol/L (ref 3.5–5.1)
Sodium: 140 mmol/L (ref 135–145)

## 2021-11-08 LAB — COMPREHENSIVE METABOLIC PANEL
ALT: 16 U/L (ref 0–44)
AST: 23 U/L (ref 15–41)
Albumin: 4.1 g/dL (ref 3.5–5.0)
Alkaline Phosphatase: 59 U/L (ref 38–126)
Anion gap: 10 (ref 5–15)
BUN: 13 mg/dL (ref 8–23)
CO2: 25 mmol/L (ref 22–32)
Calcium: 9.5 mg/dL (ref 8.9–10.3)
Chloride: 105 mmol/L (ref 98–111)
Creatinine, Ser: 0.97 mg/dL (ref 0.61–1.24)
GFR, Estimated: >60 mL/min (ref >60)
Glucose, Bld: 114 mg/dL — ABNORMAL HIGH (ref 70–99)
Potassium: 3.5 mmol/L (ref 3.5–5.1)
Sodium: 140 mmol/L (ref 135–145)
Total Bilirubin: 0.6 mg/dL (ref 0.3–1.2)
Total Protein: 7.7 g/dL (ref 6.5–8.1)

## 2021-11-08 LAB — CBC WITH DIFFERENTIAL/PLATELET
Abs Immature Granulocytes: 0.02 10*3/uL (ref 0.00–0.07)
Abs Immature Granulocytes: 0.01 10*3/uL (ref 0.00–0.07)
Basophils Absolute: 0 10*3/uL (ref 0.0–0.1)
Basophils Absolute: 0 10*3/uL (ref 0.0–0.1)
Basophils Relative: 0 %
Basophils Relative: 0 %
Eosinophils Absolute: 0 10*3/uL (ref 0.0–0.5)
Eosinophils Absolute: 0 10*3/uL (ref 0.0–0.5)
Eosinophils Relative: 1 %
Eosinophils Relative: 0 %
HCT: 35.6 % — ABNORMAL LOW (ref 39.0–52.0)
HCT: 36.4 % — ABNORMAL LOW (ref 39.0–52.0)
Hemoglobin: 11.2 g/dL — ABNORMAL LOW (ref 13.0–17.0)
Hemoglobin: 11.5 g/dL — ABNORMAL LOW (ref 13.0–17.0)
Immature Granulocytes: 0 %
Immature Granulocytes: 0 %
Lymphocytes Relative: 9 %
Lymphocytes Relative: 9 %
Lymphs Abs: 0.6 10*3/uL — ABNORMAL LOW (ref 0.7–4.0)
Lymphs Abs: 0.5 10*3/uL — ABNORMAL LOW (ref 0.7–4.0)
MCH: 30.9 pg (ref 26.0–34.0)
MCH: 31.1 pg (ref 26.0–34.0)
MCHC: 31.5 g/dL (ref 30.0–36.0)
MCHC: 31.6 g/dL (ref 30.0–36.0)
MCV: 98.3 fL (ref 80.0–100.0)
MCV: 98.4 fL (ref 80.0–100.0)
Monocytes Absolute: 1 10*3/uL (ref 0.1–1.0)
Monocytes Absolute: 1 10*3/uL (ref 0.1–1.0)
Monocytes Relative: 15 %
Monocytes Relative: 17 %
Neutro Abs: 4.8 10*3/uL (ref 1.7–7.7)
Neutro Abs: 4.2 10*3/uL (ref 1.7–7.7)
Neutrophils Relative %: 75 %
Neutrophils Relative %: 74 %
Platelets: 162 10*3/uL (ref 150–400)
Platelets: 143 10*3/uL — ABNORMAL LOW (ref 150–400)
RBC: 3.62 MIL/uL — ABNORMAL LOW (ref 4.22–5.81)
RBC: 3.7 MIL/uL — ABNORMAL LOW (ref 4.22–5.81)
RDW: 15 % (ref 11.5–15.5)
RDW: 15.2 % (ref 11.5–15.5)
WBC: 6.4 10*3/uL (ref 4.0–10.5)
WBC: 5.7 10*3/uL (ref 4.0–10.5)
nRBC: 0 % (ref 0.0–0.2)
nRBC: 0 % (ref 0.0–0.2)

## 2021-11-08 LAB — TROPONIN I (HIGH SENSITIVITY)
Troponin I (High Sensitivity): 20 ng/L — ABNORMAL HIGH (ref <18)
Troponin I (High Sensitivity): 21 ng/L — ABNORMAL HIGH (ref <18)

## 2021-11-08 LAB — URINALYSIS, ROUTINE W REFLEX MICROSCOPIC
Bilirubin Urine: NEGATIVE
Glucose, UA: NEGATIVE mg/dL
Hgb urine dipstick: NEGATIVE
Ketones, ur: NEGATIVE mg/dL
Nitrite: NEGATIVE
Protein, ur: NEGATIVE mg/dL
Specific Gravity, Urine: 1.012 (ref 1.005–1.030)
pH: 7 (ref 5.0–8.0)

## 2021-11-08 LAB — PROTIME-INR
INR: 2.4 — ABNORMAL HIGH (ref 0.8–1.2)
INR: 2.1 — ABNORMAL HIGH (ref 0.8–1.2)
Prothrombin Time: 26.2 seconds — ABNORMAL HIGH (ref 11.4–15.2)
Prothrombin Time: 23.5 seconds — ABNORMAL HIGH (ref 11.4–15.2)

## 2021-11-08 LAB — APTT
aPTT: 48 seconds — ABNORMAL HIGH (ref 24–36)
aPTT: 56 seconds — ABNORMAL HIGH (ref 24–36)

## 2021-11-08 LAB — CBG MONITORING, ED: Glucose-Capillary: 118 mg/dL — ABNORMAL HIGH (ref 70–99)

## 2021-11-08 LAB — LACTIC ACID, PLASMA: Lactic Acid, Venous: 1.5 mmol/L (ref 0.5–1.9)

## 2021-11-08 MED ORDER — SODIUM CHLORIDE 0.9 % IV SOLN
1.0000 g | Freq: Once | INTRAVENOUS | Status: AC
  Administered 2021-11-08: 1 g via INTRAVENOUS
  Filled 2021-11-08: qty 10

## 2021-11-08 MED ORDER — ALLOPURINOL 300 MG PO TABS
150.0000 mg | ORAL_TABLET | Freq: Every morning | ORAL | Status: DC
  Administered 2021-11-09 – 2021-11-12 (×4): 150 mg via ORAL
  Filled 2021-11-08 (×2): qty 1
  Filled 2021-11-08: qty 0.5
  Filled 2021-11-08: qty 1

## 2021-11-08 MED ORDER — IBUPROFEN 400 MG PO TABS: 400.0000 mg | ORAL_TABLET | Freq: Once | ORAL | Status: DC

## 2021-11-08 MED ORDER — GABAPENTIN 100 MG PO CAPS
100.0000 mg | ORAL_CAPSULE | Freq: Every day | ORAL | Status: DC
  Administered 2021-11-09 – 2021-11-12 (×4): 100 mg via ORAL
  Filled 2021-11-08 (×4): qty 1

## 2021-11-08 MED ORDER — CEPHALEXIN 500 MG PO CAPS: 500.0000 mg | ORAL_CAPSULE | Freq: Two times a day (BID) | ORAL | 0 refills | Status: DC

## 2021-11-08 MED ORDER — TRAZODONE HCL 50 MG PO TABS
50.0000 mg | ORAL_TABLET | Freq: Every evening | ORAL | Status: DC | PRN
  Administered 2021-11-08 – 2021-11-10 (×3): 50 mg via ORAL
  Filled 2021-11-08 (×3): qty 1

## 2021-11-08 MED ORDER — ACETAMINOPHEN 325 MG PO TABS
650.0000 mg | ORAL_TABLET | Freq: Four times a day (QID) | ORAL | Status: DC | PRN
  Administered 2021-11-09: 650 mg via ORAL
  Filled 2021-11-08: qty 2

## 2021-11-08 MED ORDER — SODIUM CHLORIDE 0.9 % IV SOLN
1.0000 g | INTRAVENOUS | Status: DC
  Administered 2021-11-08 – 2021-11-10 (×3): 1 g via INTRAVENOUS
  Filled 2021-11-08 (×3): qty 10

## 2021-11-08 MED ORDER — ACETAMINOPHEN 500 MG PO TABS
1000.0000 mg | ORAL_TABLET | Freq: Once | ORAL | Status: AC
  Administered 2021-11-08: 1000 mg via ORAL
  Filled 2021-11-08: qty 2

## 2021-11-08 MED ORDER — ENSURE ENLIVE PO LIQD
237.0000 mL | ORAL | Status: DC
  Administered 2021-11-08: 237 mL via ORAL
  Filled 2021-11-08: qty 237

## 2021-11-08 MED ORDER — GABAPENTIN 300 MG PO CAPS
300.0000 mg | ORAL_CAPSULE | Freq: Every day | ORAL | Status: DC
  Administered 2021-11-08 – 2021-11-11 (×4): 300 mg via ORAL
  Filled 2021-11-08 (×3): qty 1
  Filled 2021-11-08: qty 3

## 2021-11-08 MED ORDER — VITAMIN D 25 MCG (1000 UNIT) PO TABS
1000.0000 [IU] | ORAL_TABLET | Freq: Every day | ORAL | Status: DC
  Administered 2021-11-09 – 2021-11-12 (×4): 1000 [IU] via ORAL
  Filled 2021-11-08 (×4): qty 1

## 2021-11-08 MED ORDER — FENTANYL CITRATE PF 50 MCG/ML IJ SOSY
25.0000 ug | PREFILLED_SYRINGE | Freq: Once | INTRAMUSCULAR | Status: AC
  Administered 2021-11-08: 25 ug via INTRAVENOUS
  Filled 2021-11-08: qty 1

## 2021-11-08 MED ORDER — ROSUVASTATIN CALCIUM 10 MG PO TABS
10.0000 mg | ORAL_TABLET | Freq: Every day | ORAL | Status: DC
  Administered 2021-11-09 – 2021-11-11 (×3): 10 mg via ORAL
  Filled 2021-11-08 (×3): qty 1

## 2021-11-08 MED ORDER — FINASTERIDE 5 MG PO TABS
5.0000 mg | ORAL_TABLET | Freq: Every day | ORAL | Status: DC
  Administered 2021-11-09 – 2021-11-12 (×4): 5 mg via ORAL
  Filled 2021-11-08 (×4): qty 1

## 2021-11-08 MED ORDER — LACTATED RINGERS IV BOLUS
1000.0000 mL | Freq: Once | INTRAVENOUS | Status: AC
  Administered 2021-11-08: 1000 mL via INTRAVENOUS

## 2021-11-08 MED ORDER — FERROUS SULFATE 325 (65 FE) MG PO TABS
325.0000 mg | ORAL_TABLET | Freq: Two times a day (BID) | ORAL | Status: DC
  Administered 2021-11-08 – 2021-11-12 (×8): 325 mg via ORAL
  Filled 2021-11-08 (×8): qty 1

## 2021-11-08 NOTE — ED Provider Notes (Signed)
[facility] DEPT Provider Note   CSN: [id number] Arrival date & time: 11/08/21  1652     History  Chief Complaint  Patient presents with   Weakness    [name]. is a 86 y.o. male.  Patient presents to the hospital with his family with a chief complaint of worsening generalized weakness.  Patient with reported multiple falls this week.  Patient was evaluated at [facility] after midnight this morning and diagnosed with a urinary tract infection.  After going home his weakness has continued.  Patient's family states that his weakness is definitely worse than it was yesterday.  They deny any known loss of consciousness or any new head injury.  Family is requesting assistance with either skilled nursing facility or home health.  Patient was given Rocephin while in the emergency department and was prescribed Keflex to be taken twice daily.  Patient's family and patient states he has not yet had Keflex since discharge.  No new complaints.  Patient denies chest pain, shortness of breath, dysuria, abdominal pain, nausea, vomiting, headache.  Past medical history otherwise significant for hypertension, aortic insufficiency, CAD, borderline diabetes, history of urinary retention, heart murmur, previous stroke, persistent A-fib  HPI     Home Medications Prior to Admission medications   Medication Sig Start Date End Date Taking? Authorizing Provider  acetaminophen (TYLENOL) 500 MG tablet Take 2 tablets (1,000 mg total) by mouth every 6 (six) hours. 09/20/21   Irving Copas, PA-C  allopurinol (ZYLOPRIM) 300 MG tablet Take 150 mg by mouth every morning.    [provider]  amLODipine (NORVASC) 10 MG tablet TAKE 1 TABLET BY MOUTH DAILY 10/30/21   Jerline Pain, MD  atenolol (TENORMIN) 25 MG tablet Take 0.5 tablets (12.5 mg total) by mouth daily. 05/24/21   Lavina Hamman, MD  cephALEXin (KEFLEX) 500 MG capsule Take 1 capsule (500 mg total)  by mouth 2 (two) times daily. 11/08/21   Montine Circle, PA-C  cholecalciferol (VITAMIN D3) 25 MCG (1000 UNIT) tablet Take 1,000 Units by mouth daily.    [provider]  Cinnamon 500 MG capsule Take 500 mg by mouth every morning.    [provider]  enoxaparin (LOVENOX) 80 MG/0.8ML injection Inject 0.8 mLs (80 mg total) into the skin every 12 (twelve) hours. 09/12/21   Jerline Pain, MD  feeding supplement (ENSURE ENLIVE / ENSURE PLUS) LIQD Take 237 mLs by mouth 2 (two) times daily between meals. Patient taking differently: Take 237 mLs by mouth once a week. 05/24/21   Lavina Hamman, MD  ferrous sulfate 325 (65 FE) MG EC tablet Take 325 mg by mouth 2 (two) times daily. 10/29/19   [provider]  finasteride (PROSCAR) 5 MG tablet Take 1 tablet (5 mg total) by mouth daily. 11/05/16   Angiulli, Lavon Paganini, PA-C  gabapentin (NEURONTIN) 100 MG capsule Take 100-300 mg by mouth See admin instructions. Take 1 capsule (100 mg) by mouth in the morning and take 3 capsules (300 mg) at bedtime    [provider]  Glucos-Chondroit-Collag-Hyal (GLUCOSAMINE CHONDROIT-COLLAGEN PO) Take 1 tablet by mouth 2 (two) times daily.    [provider]  Multiple Vitamins-Minerals (CENTRUM SILVER ADULT 50+) TABS Take 1 tablet by mouth daily.    [provider]  oxyCODONE (OXY IR/ROXICODONE) 5 MG immediate release tablet Take 1 tablet (5 mg total) by mouth every 4 (four) hours as needed for severe pain. 09/20/21  Costella Hatcher R, PA-C  rosuvastatin (CRESTOR) 10 MG tablet Take 1 tablet (10 mg total) by mouth daily. 05/29/21   [name] T, PA-C  tamsulosin (FLOMAX) 0.4 MG CAPS capsule Take 2 capsules (0.8 mg total) by mouth daily after breakfast. Patient taking differently: Take 0.8 mg by mouth daily after supper. 11/05/16   Angiulli, Lavon Paganini, PA-C  traZODone (DESYREL) 50 MG tablet Take 50 mg by mouth at bedtime as needed for sleep.    [provider]  warfarin  (COUMADIN) 5 MG tablet Take 1 tablet (5 mg total) by mouth daily at 4 PM. Until seen by warfarin clinic. Patient taking differently: Take 5-7.5 mg by mouth See admin instructions. Take 7.5 mg on Mondays Take 5 mg on all other days 05/24/21   Lavina Hamman, MD      Allergies    Sulfa antibiotics and Zestril [lisinopril]    Review of Systems   Review of Systems  Constitutional:  Positive for fever.  Respiratory:  Negative for shortness of breath.   Cardiovascular:  Negative for chest pain and leg swelling.  Gastrointestinal:  Negative for abdominal pain.  Genitourinary:  Negative for dysuria and hematuria.  Neurological:  Positive for weakness. Negative for facial asymmetry.    Physical Exam Updated Vital Signs BP (!) 140/78   Pulse 78   Temp 100.2 F (37.9 C) (Oral)   Resp 18   SpO2 93%  Physical Exam Vitals and nursing note reviewed.  Constitutional:      General: He is not in acute distress.    Appearance: He is well-developed.  HENT:     Head: Normocephalic and atraumatic.     Mouth/Throat:     Mouth: Mucous membranes are moist.  Eyes:     Extraocular Movements: Extraocular movements intact.     Conjunctiva/sclera: Conjunctivae normal.     Pupils: Pupils are equal, round, and reactive to light.  Cardiovascular:     Rate and Rhythm: Normal rate and regular rhythm.     Heart sounds: No murmur heard. Pulmonary:     Effort: Pulmonary effort is normal. No respiratory distress.     Breath sounds: Normal breath sounds.  Abdominal:     Palpations: Abdomen is soft.     Tenderness: There is no abdominal tenderness.  Musculoskeletal:        General: No swelling or tenderness.     Cervical back: Neck supple.     Right lower leg: No edema.     Left lower leg: No edema.  Skin:    General: Skin is warm and dry.     Capillary Refill: Capillary refill takes less than 2 seconds.  Neurological:     Mental Status: He is alert.     Sensory: No sensory deficit.      Coordination: Coordination normal.     Comments: Cranial nerves II through VII, XI, XII intact  Psychiatric:        Mood and Affect: Mood normal.     ED Results / Procedures / Treatments   Labs (all labs ordered are listed, but only abnormal results are displayed) Labs Reviewed  BASIC METABOLIC PANEL - Abnormal; Notable for the following components:      Result Value   Glucose, Bld 118 (*)    All other components within normal limits  CBC WITH DIFFERENTIAL/PLATELET - Abnormal; Notable for the following components:   RBC 3.70 (*)    Hemoglobin 11.5 (*)    HCT 36.4 (*)  Platelets 143 (*)    Lymphs Abs 0.5 (*)    All other components within normal limits  PROTIME-INR - Abnormal; Notable for the following components:   Prothrombin Time 23.5 (*)    INR 2.1 (*)    All other components within normal limits  APTT - Abnormal; Notable for the following components:   aPTT 56 (*)    All other components within normal limits  CBG MONITORING, ED - Abnormal; Notable for the following components:   Glucose-Capillary 118 (*)    All other components within normal limits  TROPONIN I (HIGH SENSITIVITY) - Abnormal; Notable for the following components:   Troponin I (High Sensitivity) 20 (*)    All other components within normal limits  TROPONIN I (HIGH SENSITIVITY)    EKG EKG Interpretation  Date/Time:  Wednesday November 08 2021 17:07:02 EDT Ventricular Rate:  88 PR Interval:  118 QRS Duration: 92 QT Interval:  407 QTC Calculation: 493 R Axis:   108 Text Interpretation: Sinus rhythm Borderline short PR interval Anterolateral infarct, old Confirmed by Regan Lemming ([id number]) on 11/08/2021 6:30:31 PM  Radiology CT Head Wo Contrast  Result Date: 11/08/2021 CLINICAL DATA:  Altered mental status. EXAM: CT HEAD WITHOUT CONTRAST TECHNIQUE: Contiguous axial images were obtained from the base of the skull through the vertex without intravenous contrast. RADIATION DOSE REDUCTION: This exam was  performed according to the departmental dose-optimization program which includes automated exposure control, adjustment of the mA and/or kV according to patient size and/or use of iterative reconstruction technique. COMPARISON:  Same day. FINDINGS: Brain: Mild diffuse cortical atrophy is noted. Old right MCA infarction is again noted. No mass effect or midline shift is noted. Ventricular size is within normal limits. There is no evidence of mass lesion, hemorrhage or acute infarction. Vascular: No hyperdense vessel or unexpected calcification. Skull: Normal. Negative for fracture or focal lesion. Sinuses/Orbits: No acute finding. Other: None. IMPRESSION: No acute intracranial abnormality seen. Electronically Signed   By: Marijo Conception M.D.   On: 11/08/2021 18:01   CT HEAD WO CONTRAST (5MM)  Result Date: 11/08/2021 CLINICAL DATA:  Fall EXAM: CT HEAD WITHOUT CONTRAST TECHNIQUE: Contiguous axial images were obtained from the base of the skull through the vertex without intravenous contrast. RADIATION DOSE REDUCTION: This exam was performed according to the departmental dose-optimization program which includes automated exposure control, adjustment of the mA and/or kV according to patient size and/or use of iterative reconstruction technique. COMPARISON:  None Available. FINDINGS: Brain: Severe atrophy. Underlying chronic small vessel disease. Old right MCA infarct is unchanged. No acute intracranial abnormality. Specifically, no hemorrhage, hydrocephalus, mass lesion, acute infarction, or significant intracranial injury. Vascular: No hyperdense vessel or unexpected calcification. Skull: No acute calvarial abnormality. Sinuses/Orbits: No acute findings Other: None IMPRESSION: Old right MCA infarct. Atrophy, chronic microvascular disease. No acute intracranial abnormality. Electronically Signed   By: Rolm Baptise M.D.   On: 11/08/2021 02:20   DG Knee Complete 4 Views Left  Result Date: 11/08/2021 CLINICAL DATA:   Fall, leg pain EXAM: LEFT KNEE - COMPLETE 4+ VIEW COMPARISON:  None Available. FINDINGS: Prior left knee replacement. No hardware complicating feature. No acute bony abnormality. Specifically, no fracture, subluxation, or dislocation. No joint effusion. IMPRESSION: Prior left knee replacement.  No acute bony abnormality. Electronically Signed   By: Rolm Baptise M.D.   On: 11/08/2021 02:18   DG Chest Port 1 View  Result Date: 11/08/2021 CLINICAL DATA:  Questionable sepsis - evaluate for abnormality EXAM: PORTABLE CHEST  1 VIEW COMPARISON:  05/21/2021 FINDINGS: Prior CABG. Heart is borderline in size. No confluent airspace opacities, effusions or edema. No acute bony abnormality. IMPRESSION: No active disease. Electronically Signed   By: Rolm Baptise M.D.   On: 11/08/2021 01:02    Procedures Procedures    Medications Ordered in ED Medications - No data to display  ED Course/ Medical Decision Making/ A&P                           Medical Decision Making  This patient presents to the ED for concern of worsening weakness, this involves an extensive number of treatment options, and is a complaint that carries with it a high risk of complications and morbidity.  The differential diagnosis includes sepsis, urinary tract infection, dysrhythmia, ACS, pneumonia, and others   Co morbidities that complicate the patient evaluation  History of previous stroke, hypertension   Additional history obtained:  Additional history obtained from family at bedside External records from outside source obtained and reviewed including ED notes and results from this morning showing signs of UTI on urinalysis, negative chest x-ray, negative head CT   Lab Tests:  I Ordered, and personally interpreted labs.  The pertinent results include: Troponin 20, unremarkable BMP, hemoglobin 11.5, PT/INR and APTT close to baseline   Imaging Studies ordered:  I ordered imaging studies including head CT I independently  visualized and interpreted imaging which showed no acute findings I agree with the radiologist interpretation   Cardiac Monitoring: / EKG:  The patient was maintained on a cardiac monitor.  I personally viewed and interpreted the cardiac monitored which showed an underlying rhythm of: Sinus rhythm   Consultations Obtained:  I requested consultation with the social work team and requested help with arranging home health  Upon seeing the patient's continued weakness requested consultation with the hospitalist.  Dr.Tu agreed to see the patient for admission   Problem List / ED Course / Critical interventions / Medication management   I ordered medication including keflex for antibiotic coverage  Reevaluation of the patient after these medicines showed that the patient stayed the same I have reviewed the patients home medicines and have made adjustments as needed   Social Determinants of Health:  Patient lives at home with his wife.  Patient's son is looking at skilled nursing options   Test / Admission - Considered:  There is no indication at this time for admission.  Work-up shows similar results to this morning where the patient was diagnosed with a urinary tract infection.  Troponin legible with no ischemic changes on EKG to show or suggest ACS.  No pneumonia on chest x-ray as of this morning.  Head CT was normal.  Vital signs are grossly normal.   Initially considered discharging patient home with [facility] consult and home health face-to-face order but patient is unable to stand at this time.  Based on patient's continued weakness feel the patient would benefit from admission.  Patient may need SNF placement as well.        Final Clinical Impression(s) / ED Diagnoses Final diagnoses:  Weakness  Acute cystitis without hematuria    Rx / DC Orders ED Discharge Orders     None         Ronny Bacon 11/08/21 2118    Regan Lemming, MD 11/08/21 2249

## 2021-11-08 NOTE — ED Notes (Signed)
Contacted spouse and made aware that the pt is up for discharge. Per spouse it will be approximately 30 min before she will be here to pick up pt

## 2021-11-08 NOTE — ED Triage Notes (Signed)
BIB [facility] from home due to a fall. Went to the restroom to empty out foley leg bag and knee went out on him. Pt was on the floor for at least an hour. Denies hitting his head but he is on thinners Coumadin. Abrasions to lt elbow. Lt knee surgery couple of months ago it is red and warm to the touch. Has a foley leg bag in place. Per pt has it due to unable to urinate when he was d/c from the hospital and they placed a foley. Per pt he reuses the same foley bag over and over. A&Ox4, A-Fib with PVC per EMS CBG 159. PIV 20ga lt ac

## 2021-11-08 NOTE — H&P (Signed)
History and Physical    Patient: [name]. MLY:[id number] DOB: 1935-04-03 DOA: 11/08/2021 DOS: the patient was seen and examined on 11/08/2021 PCP: Marda Stalker, PA-C  Patient coming from: Home  Chief Complaint:  Chief Complaint  Patient presents with   Weakness   HPI: Gerald Sato. is a 86 y.o. male with medical history significant of aortic insufficiency s/p bioprosthetic AVR, PFO closure (2012), s/p MV repair, TV repair, MAZE, LA clipping, HTN, permanent atrial fibrillation/flutter on warfarin, R MCA CVA, CAD, GI bleed, urinary retention with chronic foley catheter who presents with weakness    Patient is alert and oriented x4 but unable to provide much history.  Reportedly was ambulating to the bathroom to empty out Foley leg bag when his legs gave out and he had a fall.  He presented earlier today to [facility] for evaluation and had UA positive for more leuk esterase, many bacteria.  He was given IV Rocephin and discharged home with antibiotics.  CT head was negative. Reportedly patient continued be weak and later had slurred speech prompting him to present here to [facility] long ED.  Patient reports at baseline he is able to ambulate independently but has not been able to since today. He is found to have left-sided upper and lower extremity weakness but unable to tell me whether this is residual from his old right MCA stroke.  Believes he took his last warfarin dose about 2 days ago.  INR is therapeutic today at 2.1.  He was also febrile to 100.5 F, hypertensive to 157/80.  No leukocytosis.  Hemoglobin stable 11.5, platelet 143.  Sodium of 140, K of 3.9, creatinine of 0.98.  Review of Systems: As mentioned in the history of present illness. All other systems reviewed and are negative. Past Medical History:  Diagnosis Date   Acquired dilation of ascending aorta and aortic root ([place])    70m aortic root and 481mascending aorta on echo 03/2020   Arthritis     Bladder stones    Borderline diabetes    BPH (benign prostatic hyperplasia)    CAD (coronary artery disease) 12/19/2020   Non-obstructive coronary artery disease at cath in 2019   Coronary artery disease    cardiologist-  dr skMarlou PorchloCecille Rubinerhart NP--- per cath 06-02-2010 non-obstructive cad pLAD 30-40%   Diverticulosis of colon    Dysrhythmia    afib   Gout    Heart murmur    History of adenomatous polyp of colon    2002-- tubular adenoma   History of aortic insufficiency    severe -- s/p  AVR 08-03-2010   History of small bowel obstruction    02/ 2007 mechanical sbo s/p  surgical intervention;  partial sbo 09/ 2011 and 03-20-2011 resolved without surgical intervention   History of urinary retention    HTN (hypertension)    Peripheral neuropathy    Persistent atrial fibrillation (HCC)    S/P aortic valve replacement with prosthetic valve 08/03/2010   tissue valve   S/P Maze operation for atrial fibrillation 01/30/2018   Complete bilateral atrial lesion set using bipolar radiofrequency and cryothermy with clipping of LA appendage   S/P MVR (mitral valve repair) 01/30/2018   Complex valvuloplasty including artificial Gore-tex neochord placement x4 and Carbo medics Annuloflex ring annuloplasty, size 28   S/P patent foramen ovale closure 08/03/2010   at same time AVR   S/P tricuspid valve repair 01/30/2018   Using an MC3 Annuloplasty ring, size 28  Stroke [facility])    Tricuspid regurgitation    Past Surgical History:  Procedure Laterality Date   BIOPSY  09/07/2018   Procedure: BIOPSY;  Surgeon: Otis Brace, MD;  Location: [facility];  Service: Gastroenterology;;   CARDIAC CATHETERIZATION  06-02-2010  dr Marlou Porch   non-obstructive cad- pLAD 30-40%/  normal LVSF/  severe AI   CARDIOVASCULAR STRESS TEST  04/12/2016   Low risk nuclear perfusion study w/ no significant reversible ischemia/  normal LV function and wall motion ,  stress ef 60%/  11m inferior and lateral scooped  ST-segment depression w/ exercise (may be repolarization abnormality), exercise capacity was moderately reduced   CATARACT EXTRACTION W/ INTRAOCULAR LENS  IMPLANT, BILATERAL  02/2010   CLIPPING OF ATRIAL APPENDAGE N/A 01/30/2018   Procedure: CLIPPING OF LEFT ATRIAL APPENDAGE USING ATRICLIP PRO2 45MM;  Surgeon: ORexene Alberts MD;  [place]  Service: Open Heart Surgery;  Laterality: N/A;   COLONOSCOPY WITH PROPOFOL N/A 10/21/2018   Procedure: COLONOSCOPY WITH PROPOFOL;  Surgeon: BOtis Brace MD;  [facility];  Service: Gastroenterology;  Laterality: N/A;   CYSTOSCOPY WITH LITHOLAPAXY N/A 06/05/2016   Procedure: CYSTOSCOPY WITH LITHOLAPAXY and fulgarization of bladder neck;  Surgeon: WIrine Seal MD;  [facility]  Service: Urology;  Laterality: N/A;   ESOPHAGOGASTRODUODENOSCOPY (EGD) WITH PROPOFOL N/A 09/07/2018   Procedure: ESOPHAGOGASTRODUODENOSCOPY (EGD) WITH PROPOFOL;  Surgeon: BOtis Brace MD;  [facility];  Service: Gastroenterology;  Laterality: N/A;   EXPLORATORY LAPARTOMY /  CHOLECYSTECTOMY  02/28/2005   for Small  bowel obstruction (mechnical)   IR ANGIO EXTRACRAN SEL COM CAROTID INNOMINATE UNI L MOD SED  10/24/2016   IR ANGIO VERTEBRAL SEL SUBCLAVIAN INNOMINATE BILAT MOD SED  10/24/2016   IR PERCUTANEOUS ART THROMBECTOMY/INFUSION INTRACRANIAL INC DIAG ANGIO  10/24/2016   IR RADIOLOGIST EVAL & MGMT  12/05/2016   LEFT KNEE ARTHROSCOPY  2006   MAZE N/A 01/30/2018   Procedure: MAZE;  Surgeon: ORexene Alberts MD;  [place] [facility]  Service: Open Heart Surgery;  Laterality: N/A;   MITRAL VALVE REPAIR N/A 01/30/2018   Procedure: MITRAL VALVE REPAIR (MVR) USING CARBOMEDICS ANNULOFLEX SIZE 28;  Surgeon: ORexene Alberts MD;  [place]  Service: Open Heart Surgery;  Laterality: N/A;   POLYPECTOMY  10/21/2018   Procedure: POLYPECTOMY;  Surgeon: BOtis Brace MD;  [facility];  Service: Gastroenterology;;    RADIOLOGY WITH ANESTHESIA N/A 10/24/2016   Procedure: RADIOLOGY WITH ANESTHESIA;  Surgeon: DLuanne Bras MD;  [place]  Service: Radiology;  Laterality: N/A;   RIGHT FOOT SURGERY     RIGHT MINIATURE ANTERIOR THORACOTOMY FOR AORTIC VALVE REPLACEMENT AND CLOSURE PATENT FORAMEN OVALE  08-03-2010  DR OLevada SchillingMagna-ease pericardial tissue valve (251m   RIGHT/LEFT HEART CATH AND CORONARY ANGIOGRAPHY N/A 10/14/2017   Procedure: RIGHT/LEFT HEART CATH AND CORONARY ANGIOGRAPHY;  Surgeon: CoSherren MochaMD;  [facility];  Service: Cardiovascular;  Laterality: N/A;   TEE WITHOUT CARDIOVERSION N/A 10/14/2017   Procedure: TRANSESOPHAGEAL ECHOCARDIOGRAM (TEE);  Surgeon: NiJosue HectorMD;  [facility];  Service: Cardiovascular;  Laterality: N/A;   TOTAL HIP ARTHROPLASTY Left 08/02/2020   Procedure: TOTAL HIP ARTHROPLASTY ANTERIOR APPROACH;  Surgeon: OlParalee CancelMD;  [facility];  Service: Orthopedics;  Laterality: Left;   TOTAL KNEE ARTHROPLASTY Left 09/19/2021   Procedure: TOTAL KNEE ARTHROPLASTY;  Surgeon: OlParalee CancelMD;  [facility];  Service: Orthopedics;  Laterality: Left;  TRANSTHORACIC ECHOCARDIOGRAM  05/30/2016  dr skains   moderate  LVH ef 60-65%/  bioprothesis aortic valve present ,normal grandient and no AI /  mild MV calcification , moderate MR /  mild PR/ moderate TR/  PASP 25mHg/ (RA denisty was identified 04-27-2016 echo) and is seen again today, this is likely a promient eustacian ridge, atrium is normal size   TRICUSPID VALVE REPLACEMENT N/A 01/30/2018   Procedure: TRICUSPID VALVE REPAIR USING MC3 SIZE 28;  Surgeon: ORexene Alberts MD;  [place]  Service: Open Heart Surgery;  Laterality: N/A;   Social History:  reports that he has never smoked. He has never used smokeless tobacco. He reports current alcohol use. He reports that he does not use drugs.  Allergies  Allergen Reactions   Sulfa Antibiotics Other (See  Comments)    Granulocytosis   Zestril [Lisinopril] Cough    Family History  Problem Relation Age of Onset   Heart disease Mother    Brain cancer Father     Prior to Admission medications   Medication Sig Start Date End Date Taking? Authorizing Provider  acetaminophen (TYLENOL) 500 MG tablet Take 2 tablets (1,000 mg total) by mouth every 6 (six) hours. 09/20/21  Yes DIrving Copas PA-C  allopurinol (ZYLOPRIM) 300 MG tablet Take 150 mg by mouth every morning.   Yes [provider]  amLODipine (NORVASC) 10 MG tablet TAKE 1 TABLET BY MOUTH DAILY Patient taking differently: Take 10 mg by mouth daily. 10/30/21  Yes SJerline Pain MD  atenolol (TENORMIN) 25 MG tablet Take 0.5 tablets (12.5 mg total) by mouth daily. 05/24/21  Yes PLavina Hamman MD  cholecalciferol (VITAMIN D3) 25 MCG (1000 UNIT) tablet Take 1,000 Units by mouth daily.   Yes [provider]  Cinnamon 500 MG capsule Take 500 mg by mouth every morning.   Yes [provider]  ferrous sulfate 325 (65 FE) MG EC tablet Take 325 mg by mouth 2 (two) times daily. 10/29/19  Yes [provider]  finasteride (PROSCAR) 5 MG tablet Take 1 tablet (5 mg total) by mouth daily. 11/05/16  Yes Angiulli, DLavon Paganini PA-C  gabapentin (NEURONTIN) 100 MG capsule Take 100-300 mg by mouth See admin instructions. Take 1 capsule (100 mg) by mouth in the morning and take 3 capsules (300 mg) at bedtime   Yes [provider]  Multiple Vitamins-Minerals (CENTRUM SILVER ADULT 50+) TABS Take 1 tablet by mouth daily.   Yes [provider]  rosuvastatin (CRESTOR) 10 MG tablet Take 1 tablet (10 mg total) by mouth daily. 05/29/21  Yes Weaver, Scott T, PA-C  silodosin (RAPAFLO) 8 MG CAPS capsule Take 8 mg by mouth daily. 11/02/21  Yes [provider]  traZODone (DESYREL) 50 MG tablet Take 50 mg by mouth at bedtime as needed for sleep.   Yes [provider]  warfarin (COUMADIN) 5 MG tablet Take 1  tablet (5 mg total) by mouth daily at 4 PM. Until seen by warfarin clinic. Patient taking differently: Take 5-7.5 mg by mouth See admin instructions. Take 7.5 mg on Mondays Take 5 mg on all other days 05/24/21  Yes PLavina Hamman MD  cephALEXin (KEFLEX) 500 MG capsule Take 1 capsule (500 mg total) by mouth 2 (two) times daily. 11/08/21   BMontine Circle PA-C  enoxaparin (LOVENOX) 80 MG/0.8ML injection Inject 0.8 mLs (80 mg total) into the skin every 12 (twelve) hours. Patient not taking: Reported on 11/08/2021 09/12/21   SCandee Furbish  C, MD  feeding supplement (ENSURE ENLIVE / ENSURE PLUS) LIQD Take 237 mLs by mouth 2 (two) times daily between meals. Patient taking differently: Take 237 mLs by mouth once a week. 05/24/21   Lavina Hamman, MD  oxyCODONE (OXY IR/ROXICODONE) 5 MG immediate release tablet Take 1 tablet (5 mg total) by mouth every 4 (four) hours as needed for severe pain. Patient not taking: Reported on 11/08/2021 09/20/21   Irving Copas, PA-C  tamsulosin [facility]) 0.4 MG CAPS capsule Take 2 capsules (0.8 mg total) by mouth daily after breakfast. Patient not taking: Reported on 11/08/2021 11/05/16   Cathlyn Parsons, PA-C    Physical Exam: Vitals:   11/08/21 1701 11/08/21 1933 11/08/21 2030 11/08/21 2123  BP: (!) 157/80 (!) 140/81 (!) 140/78   Pulse: 89 83 78   Resp: '16 16 18   '$ Temp: 100.2 F (37.9 C)   (!) 100.8 F (38.2 C)  TempSrc: Oral   Oral  SpO2: 98% 94% 93%    Constitutional: NAD, calm, comfortable, non-toxic appearing elderly male sitting upright in bed Eyes:  lids and conjunctivae normal ENMT: Mucous membranes are moist. Neck: normal, supple,  Respiratory: clear to auscultation bilaterally, no wheezing, no crackles. Normal respiratory effort. No accessory muscle use.  Cardiovascular: Regular rate and rhythm, no murmurs / rubs / gallops. No extremity edema.   Abdomen: soft, non-distended, no tenderness, no masses palpated.  Bowel sounds positive.   Musculoskeletal: no clubbing / cyanosis. No joint deformity upper and lower extremities. Good ROM, no contractures. Normal muscle tone.  Skin: no rashes, lesions, ulcers.  Neurologic: CN 2-12 grossly intact. Strength 4/5 in left upper and lower extremity. Intake finger to nose. Psychiatric: Normal judgment and insight. Alert and oriented x 3. Normal mood. Data Reviewed:  See HPI  Assessment and Plan: * Weakness Likely due to UTI but pt has hx of CVA with left sided weakness noted on exam. He is unable to tell me whether this is chronic and says perhaps that his left leg is usually the strongest. Unable to reach wife to verify.  -Given risk factors of a.fib and hx of CVA, will obtain MRI brain and will pursue full workup if positive for CVA -PT evaluation in the morning  Essential hypertension Mildly elevated. Holding antihypertensives pending MRI brain  CAD (coronary artery disease) Non-obstructive disease at cath in 2019 -on warfarin for a.fib  Atrial fibrillation (HCC) Permanent A.fib INR therapeutic on presentation at 2.1 -warfarin per pharmacy  S/P AVR bioprosthetic  s/p mitral valve repair S/p TV repair -noted  UTI (urinary tract infection) W/chronic indwelling catheter for urinary retention -continue IV Rocephin  -urine culture pending  Urinary catheter in place Pt had urinary retention following left knee replacement in September. Has chronic indwelling catheter following urology.      Advance Care Planning:   Code Status: Full Code   Consults: none  Family Communication: attempted to reach wife by home and mobile phone-sent to voicemail in both  Severity of Illness: The appropriate patient status for this patient is OBSERVATION. Observation status is judged to be reasonable and necessary in order to provide the required intensity of service to ensure the patient's safety. The patient's presenting symptoms, physical exam findings, and initial radiographic  and laboratory data in the context of their medical condition is felt to place them at decreased risk for further clinical deterioration. Furthermore, it is anticipated that the patient will be medically stable for discharge from the hospital within 2 midnights  of admission.   Author: Orene Desanctis, DO 11/08/2021 10:32 PM  For on call review [email or web address].

## 2021-11-08 NOTE — Progress Notes (Signed)
ANTICOAGULATION CONSULT NOTE - Initial Consult  Pharmacy Consult for warfarin Indication: atrial fibrillation  Allergies  Allergen Reactions   Sulfa Antibiotics Other (See Comments)    Granulocytosis   Zestril [Lisinopril] Cough    Patient Measurements:   Heparin Dosing Weight:   Vital Signs: Temp: 100.8 F (38.2 C) (11/01 2123) Temp Source: Oral (11/01 2123) BP: 140/78 (11/01 2030) Pulse Rate: 78 (11/01 2030)  Labs: Recent Labs    11/08/21 0021 11/08/21 1716  HGB 11.2* 11.5*  HCT 35.6* 36.4*  PLT 162 143*  APTT 48* 56*  LABPROT 26.2* 23.5*  INR 2.4* 2.1*  CREATININE 0.97 0.98  TROPONINIHS  --  20*    Estimated Creatinine Clearance: 61.2 mL/min (by C-G formula based on SCr of 0.98 mg/dL).   Medical History: Past Medical History:  Diagnosis Date   Acquired dilation of ascending aorta and aortic root ([place])    62m aortic root and 440mascending aorta on echo 03/2020   Arthritis    Bladder stones    Borderline diabetes    BPH (benign prostatic hyperplasia)    CAD (coronary artery disease) 12/19/2020   Non-obstructive coronary artery disease at cath in 2019   Coronary artery disease    cardiologist-  dr skMarlou PorchloCecille Rubinerhart NP--- per cath 06-02-2010 non-obstructive cad pLAD 30-40%   Diverticulosis of colon    Dysrhythmia    afib   Gout    Heart murmur    History of adenomatous polyp of colon    2002-- tubular adenoma   History of aortic insufficiency    severe -- s/p  AVR 08-03-2010   History of small bowel obstruction    02/ 2007 mechanical sbo s/p  surgical intervention;  partial sbo 09/ 2011 and 03-20-2011 resolved without surgical intervention   History of urinary retention    HTN (hypertension)    Peripheral neuropathy    Persistent atrial fibrillation (HCC)    S/P aortic valve replacement with prosthetic valve 08/03/2010   tissue valve   S/P Maze operation for atrial fibrillation 01/30/2018   Complete bilateral atrial lesion set using bipolar  radiofrequency and cryothermy with clipping of LA appendage   S/P MVR (mitral valve repair) 01/30/2018   Complex valvuloplasty including artificial Gore-tex neochord placement x4 and Carbo medics Annuloflex ring annuloplasty, size 28   S/P patent foramen ovale closure 08/03/2010   at same time AVR   S/P tricuspid valve repair 01/30/2018   Using an MC3 Annuloplasty ring, size 28   [place]   Tricuspid regurgitation     Medications:  Warfarin 7.'5mg'$  on Mondays and '5mg'$  all other days LD 11/1 @ 0900  Assessment: [age]o male presents with a chief complaint of worsening generalized weakness. Past medical history otherwise significant for hypertension, aortic insufficiency, CAD, borderline diabetes, history of urinary retention, heart murmur, previous stroke, persistent A-fib.  Pharmacy consulted to dose warfarin.  INR 2.1, Hgb 11.5, plts 143, Scr 0.98  Goal of Therapy:  INR 2-3    Plan:  No warfarin tonight Daily INR   ElDolly RiasPh 11/08/2021, 10:18 PM

## 2021-11-08 NOTE — ED Notes (Signed)
Attempted to reach wife at this time reference transport home and there was no answer

## 2021-11-08 NOTE — Assessment & Plan Note (Signed)
Permanent A.fib INR therapeutic on presentation at 2.1 -warfarin per pharmacy

## 2021-11-08 NOTE — ED Provider Triage Note (Signed)
Emergency Medicine Provider Triage Evaluation Note  [name] , a 86 y.o. male  was evaluated in triage.  Pt complains of weakness, slurred speech. States they were in the ED last night for fall. Found to have UTI. Negative imaging. States that since discharge yesterday has continued to be weak and then began having slurred speech. Gait is weak. Son at bedside states his voice is not normal for him.   Review of Systems  Positive: See above Negative:   Physical Exam  BP (!) 157/80   Pulse 89   Temp 100.2 F (37.9 C) (Oral)   Resp 16   SpO2 98%  Gen:   Awake, no distress   Resp:  Normal effort  MSK:   Moves extremities without difficulty  Other:  Foley bag present   Medical Decision Making  Medically screening exam initiated at 5:12 PM.  Appropriate orders placed.  [name] was informed that the remainder of the evaluation will be completed by another provider, this initial triage assessment does not replace that evaluation, and the importance of remaining in the ED until their evaluation is complete.  Imaging ordered. LVO negative. Outside of code stroke window.    Mickie Hillier, PA-C 11/08/21 1715

## 2021-11-08 NOTE — Assessment & Plan Note (Signed)
Mildly elevated. Holding antihypertensives pending MRI brain

## 2021-11-08 NOTE — Assessment & Plan Note (Signed)
Pt had urinary retention following left knee replacement in September. Has chronic indwelling catheter following urology.

## 2021-11-08 NOTE — ED Provider Notes (Signed)
[facility] Emergency Department Provider Note MRN:  [id number]  Arrival date & time: 11/08/21     Chief Complaint   Fall and Leg Pain   History of Present Illness   [name]. is a 86 y.o. year-old male presents to the ED with chief complaint of fall.  Patient states that he was attempting to empty his catheter bag over the toilet when he fell because his knee hurt.  He reports having had TKA a couple months ago.  He has had urinary retention since his surgery and has an indwelling Foley catheter.  He reuses the catheter bag.  He is noted to have mildly elevated temperature.  In triage of 100.5 degrees.  He states that he has pain in his left knee.  He is anticoagulated.  He denies hitting his head when he fell.  History provided by patient.   Review of Systems  Pertinent positive and negative review of systems noted in HPI.    Physical Exam   Vitals:   11/08/21 0430 11/08/21 0443  BP: (!) 130/59   Pulse: (!) 39   Resp: 16   Temp:  98.2 F (36.8 C)  SpO2: 94%     CONSTITUTIONAL:  non toxic-appearing, NAD NEURO:  Alert and oriented x 3, CN 3-12 grossly intact EYES:  eyes equal and reactive ENT/NECK:  Supple, no stridor  CARDIO:  normal rate, regular rhythm, appears well-perfused  PULM:  No respiratory distress, CTAB GI/GU:  non-distended, non tender MSK/SPINE:  No gross deformities, no edema, moves all extremities, no deformity of the left knee, the skin is not red, he has decent range of motion SKIN:  no rash, atraumatic   *Additional and/or pertinent findings included in MDM below  Diagnostic and Interventional Summary    EKG Interpretation  Date/Time:    Ventricular Rate:    PR Interval:    QRS Duration:   QT Interval:    QTC Calculation:   R Axis:     Text Interpretation:         Labs Reviewed  COMPREHENSIVE METABOLIC PANEL - Abnormal; Notable for the following components:      Result Value   Glucose, Bld 114 (*)     All other components within normal limits  CBC WITH DIFFERENTIAL/PLATELET - Abnormal; Notable for the following components:   RBC 3.62 (*)    Hemoglobin 11.2 (*)    HCT 35.6 (*)    Lymphs Abs 0.6 (*)    All other components within normal limits  PROTIME-INR - Abnormal; Notable for the following components:   Prothrombin Time 26.2 (*)    INR 2.4 (*)    All other components within normal limits  APTT - Abnormal; Notable for the following components:   aPTT 48 (*)    All other components within normal limits  URINALYSIS, ROUTINE W REFLEX MICROSCOPIC - Abnormal; Notable for the following components:   APPearance HAZY (*)    Leukocytes,Ua MODERATE (*)    Bacteria, UA MANY (*)    All other components within normal limits  CULTURE, BLOOD (ROUTINE X 2)  CULTURE, BLOOD (ROUTINE X 2)  URINE CULTURE  LACTIC ACID, PLASMA  LACTIC ACID, PLASMA    CT HEAD WO CONTRAST (5MM)  Final Result    DG Knee Complete 4 Views Left  Final Result    DG Chest Port 1 View  Final Result      Medications  acetaminophen (TYLENOL) tablet 1,000 mg (1,000 mg Oral  Given 11/08/21 0049)  fentaNYL (SUBLIMAZE) injection 25 mcg (25 mcg Intravenous Given 11/08/21 0240)  cefTRIAXone (ROCEPHIN) 1 g in sodium chloride 0.9 % 100 mL IVPB (0 g Intravenous Stopped 11/08/21 0315)  lactated ringers bolus 1,000 mL (0 mLs Intravenous Stopped 11/08/21 0340)  lactated ringers bolus 1,000 mL (0 mLs Intravenous Stopped 11/08/21 0443)     Procedures  /  Critical Care Procedures  ED Course and Medical Decision Making  I have reviewed the triage vital signs, the nursing notes, and pertinent available records from the EMR.  Social Determinants Affecting Complexity of Care: Patient has no clinically significant social determinants affecting this chief complaint..   ED Course:    Medical Decision Making Patient here with a fall.  He states that he was standing over his toilet attempting to empty his urinary catheter bag when  his knee gave out.  He states that he had knee surgery a couple months ago and is still recovering.  He has had urinary retention since the surgery, and has an indwelling Foley catheter.  He has follow-up with urology in about a week.  He was noted in triage to have mildly elevated temperature of 100.5 degrees.  This could have contributed to his fall if he has an infection.  Will check labs, urinalysis, chest x-ray, and head CT.  Laboratory work-up consistent with UTI, remainder of labs discussed below.  CT and chest x-ray reassuring discussed below.  EKG showed bigeminy, patient was given 2 L of fluid and this resolved.  He still has an occasional PVC, but blood pressure is stabilized.  Patient seen by and discussed with Dr. Matilde Sprang, who agrees with the plan.  Amount and/or Complexity of Data Reviewed Labs: ordered.    Details: UA consistent with infection, no significant electrolyte derangement, no leukocytosis Radiology: ordered and independent interpretation performed.    Details: No fracture of the left knee, chest x-ray negative for pneumonia, no ICH on CT, old stroke is seen ECG/medicine tests: ordered and independent interpretation performed.    Details: Bigeminy->NSR with occasional PVC  Risk OTC drugs. Prescription drug management. Decision regarding hospitalization.     Consultants: No consultations were needed in caring for this patient.   Treatment and Plan: I considered admission due to patient's initial presentation, but after considering the examination and diagnostic results, patient will not require admission and can be discharged with outpatient follow-up.  Patient seen by and discussed with attending physician, Dr. Matilde Sprang, who agrees with plan.  Final Clinical Impressions(s) / ED Diagnoses     ICD-10-CM   1. Fall, initial encounter  W19.XXXA     2. Acute cystitis without hematuria  N30.00     3. Bigeminy  I49.8 Ambulatory referral to Cardiology      ED  Discharge Orders          Ordered    Ambulatory referral to Cardiology       Comments: If you have not heard from the Cardiology office within the next 72 hours please call [phone].   11/08/21 0432    cephALEXin (KEFLEX) 500 MG capsule  2 times daily        11/08/21 0432              Discharge Instructions Discussed with and Provided to Patient:   Discharge Instructions   None      Montine Circle, PA-C 11/08/21 [place], [place], MD 11/08/21 0530

## 2021-11-08 NOTE — ED Notes (Signed)
Pt being moved to h13 while waiting on transport

## 2021-11-08 NOTE — Assessment & Plan Note (Signed)
Likely due to UTI but pt has hx of CVA with left sided weakness noted on exam. He is unable to tell me whether this is chronic and says perhaps that his left leg is usually the strongest. Unable to reach wife to verify.  -Given risk factors of a.fib and hx of CVA, will obtain MRI brain and will pursue full workup if positive for CVA -PT evaluation in the morning

## 2021-11-08 NOTE — ED Triage Notes (Addendum)
Pt arrived via POV with family, worsening generalized weakness. Multiple falls this week. Seen at [facility], dx with UTI this morning. Lowered himself to floor this morning after being too weak to get in bed. Denies any LOC or head strike today. Family states they need assistance with SNF or home health. Pt family states slurred speech since last night

## 2021-11-08 NOTE — Assessment & Plan Note (Signed)
S/p TV repair -noted

## 2021-11-08 NOTE — ED Notes (Signed)
RN aware pt's temp 100.8

## 2021-11-08 NOTE — ED Notes (Addendum)
Attempted to ambulate pt. Pt states he ambulates independently at baseline. Pt required max assist to sit EOB and was unsteady standing at bedside. Deferred ambulation d/t unsteadiness. RN aware

## 2021-11-08 NOTE — ED Notes (Signed)
ED provider aware of VS and is at bedside at this time

## 2021-11-08 NOTE — Assessment & Plan Note (Signed)
-   bioprosthetic  

## 2021-11-08 NOTE — Assessment & Plan Note (Signed)
Non-obstructive disease at cath in 2019 -on warfarin for a.fib

## 2021-11-08 NOTE — Assessment & Plan Note (Signed)
W/chronic indwelling catheter for urinary retention -continue IV Rocephin  -urine culture pending

## 2021-11-09 ENCOUNTER — Observation Stay (HOSPITAL_COMMUNITY): Payer: Medicare Other

## 2021-11-09 ENCOUNTER — Encounter (HOSPITAL_COMMUNITY): Payer: Self-pay | Admitting: Internal Medicine

## 2021-11-09 DIAGNOSIS — D638 Anemia in other chronic diseases classified elsewhere: Secondary | ICD-10-CM | POA: Diagnosis present

## 2021-11-09 DIAGNOSIS — Z7901 Long term (current) use of anticoagulants: Secondary | ICD-10-CM | POA: Diagnosis not present

## 2021-11-09 DIAGNOSIS — Z882 Allergy status to sulfonamides status: Secondary | ICD-10-CM | POA: Diagnosis not present

## 2021-11-09 DIAGNOSIS — Z79899 Other long term (current) drug therapy: Secondary | ICD-10-CM | POA: Diagnosis not present

## 2021-11-09 DIAGNOSIS — Z888 Allergy status to other drugs, medicaments and biological substances status: Secondary | ICD-10-CM | POA: Diagnosis not present

## 2021-11-09 DIAGNOSIS — R531 Weakness: Secondary | ICD-10-CM | POA: Diagnosis present

## 2021-11-09 DIAGNOSIS — G319 Degenerative disease of nervous system, unspecified: Secondary | ICD-10-CM | POA: Diagnosis not present

## 2021-11-09 DIAGNOSIS — N400 Enlarged prostate without lower urinary tract symptoms: Secondary | ICD-10-CM | POA: Diagnosis present

## 2021-11-09 DIAGNOSIS — I69354 Hemiplegia and hemiparesis following cerebral infarction affecting left non-dominant side: Secondary | ICD-10-CM | POA: Diagnosis not present

## 2021-11-09 DIAGNOSIS — M109 Gout, unspecified: Secondary | ICD-10-CM | POA: Diagnosis present

## 2021-11-09 DIAGNOSIS — Z8774 Personal history of (corrected) congenital malformations of heart and circulatory system: Secondary | ICD-10-CM | POA: Diagnosis not present

## 2021-11-09 DIAGNOSIS — U071 COVID-19: Secondary | ICD-10-CM | POA: Diagnosis present

## 2021-11-09 DIAGNOSIS — Z96642 Presence of left artificial hip joint: Secondary | ICD-10-CM | POA: Diagnosis present

## 2021-11-09 DIAGNOSIS — Z953 Presence of xenogenic heart valve: Secondary | ICD-10-CM | POA: Diagnosis not present

## 2021-11-09 DIAGNOSIS — I10 Essential (primary) hypertension: Secondary | ICD-10-CM | POA: Diagnosis present

## 2021-11-09 DIAGNOSIS — Z8249 Family history of ischemic heart disease and other diseases of the circulatory system: Secondary | ICD-10-CM | POA: Diagnosis not present

## 2021-11-09 DIAGNOSIS — I4821 Permanent atrial fibrillation: Secondary | ICD-10-CM | POA: Diagnosis present

## 2021-11-09 DIAGNOSIS — D696 Thrombocytopenia, unspecified: Secondary | ICD-10-CM | POA: Diagnosis present

## 2021-11-09 DIAGNOSIS — I2510 Atherosclerotic heart disease of native coronary artery without angina pectoris: Secondary | ICD-10-CM | POA: Diagnosis present

## 2021-11-09 DIAGNOSIS — Z96652 Presence of left artificial knee joint: Secondary | ICD-10-CM | POA: Diagnosis present

## 2021-11-09 LAB — CBC
HCT: 34.2 % — ABNORMAL LOW (ref 39.0–52.0)
Hemoglobin: 10.8 g/dL — ABNORMAL LOW (ref 13.0–17.0)
MCH: 31.3 pg (ref 26.0–34.0)
MCHC: 31.6 g/dL (ref 30.0–36.0)
MCV: 99.1 fL (ref 80.0–100.0)
Platelets: 136 10*3/uL — ABNORMAL LOW (ref 150–400)
RBC: 3.45 MIL/uL — ABNORMAL LOW (ref 4.22–5.81)
RDW: 15.4 % (ref 11.5–15.5)
WBC: 5.4 10*3/uL (ref 4.0–10.5)
nRBC: 0 % (ref 0.0–0.2)

## 2021-11-09 LAB — PROTIME-INR
INR: 1.9 — ABNORMAL HIGH (ref 0.8–1.2)
Prothrombin Time: 21.5 seconds — ABNORMAL HIGH (ref 11.4–15.2)

## 2021-11-09 MED ORDER — WARFARIN SODIUM 5 MG PO TABS
7.5000 mg | ORAL_TABLET | Freq: Once | ORAL | Status: AC
  Administered 2021-11-09: 7.5 mg via ORAL
  Filled 2021-11-09 (×2): qty 1

## 2021-11-09 MED ORDER — WARFARIN - PHARMACIST DOSING INPATIENT: Freq: Every day | Status: DC

## 2021-11-09 NOTE — Progress Notes (Signed)
PROGRESS NOTE  [name]. OXB:[id number] DOB: Apr 03, 1935 DOA: 11/08/2021 PCP: Marda Stalker, PA-C   LOS: 0 days   Brief Narrative / Interim history: 86 year old male with AI status post bioprosthetic AVR, PFO closure in 2012, MV repair, TV repair, maze, LAA clipping, hypertension, permanent A-fib/flutter on Coumadin, history of CVA, prior GI bleed, CAD, chronic urinary retention with chronic Foley comes into the hospital with weakness.  He was walking to go to the bathroom when feels like his legs gave out.  No significant focal weakness but felt overall weak.  He came to the ED, was found to have a UTI, he was febrile and was admitted to the hospital.  An MRI of the brain was negative for acute strokes  Subjective / 24h Interval events: States that he is doing well this morning.  No abdominal pain, no nausea or vomiting.  Assesement and Plan: Principal Problem:   Weakness Active Problems:   Essential hypertension   Atrial fibrillation (HCC)   CAD (coronary artery disease)   S/P AVR   s/p mitral valve repair   Urinary catheter in place   UTI (urinary tract infection)   Principal problem UTI, caused by chronic Foley-patient febrile to 101.4, however not tachycardic, tachypneic and white count is normal.  He does have a source with abnormal urinalysis.  Has been placed on ceftriaxone, continue.  Blood cultures and urine cultures sent, no growth so far.  Continue IV antibiotics for now since he is still febrile  Active problems Weakness -Likely due to UTI.  There was concern about worsening weakness, underwent an MRI of the brain which was negative for acute findings.  It did show chronic CVA in the right MCA branch infarct -PT recommends SNF  Essential hypertension -normotensive.  Hold home medications given fever   CAD (coronary artery disease) -Non-obstructive disease at cath in 2019.  Continue statin   Atrial fibrillation -Permanent A.fib, INR therapeutic on  presentation at 2.1. Warfarin per pharmacy   S/P AVR -bioprosthetic   S/p mitral and tricuspid valve repair -noted   Urinary catheter in place -Pt had urinary retention following left knee replacement in September. Has chronic indwelling catheter following urology.  Has an appointment next week   Anemia of chronic disease-hemoglobin stable, no bleeding  Thrombocytopenia-mild, monitor  Scheduled Meds:  allopurinol  150 mg Oral q morning   cholecalciferol  1,000 Units Oral Daily   feeding supplement  237 mL Oral Weekly   ferrous sulfate  325 mg Oral BID   finasteride  5 mg Oral Daily   gabapentin  100 mg Oral Daily   gabapentin  300 mg Oral QHS   rosuvastatin  10 mg Oral Daily   Continuous Infusions:  cefTRIAXone (ROCEPHIN)  IV Stopped (11/09/21 0016)   PRN Meds:.acetaminophen, traZODone  Current Outpatient Medications  Medication Instructions   acetaminophen (TYLENOL) 1,000 mg, Oral, Every 6 hours   allopurinol (ZYLOPRIM) 150 mg, Oral, Every morning   amLODipine (NORVASC) 10 MG tablet TAKE 1 TABLET BY MOUTH DAILY   atenolol (TENORMIN) 12.5 mg, Oral, Daily   cephALEXin (KEFLEX) 500 mg, Oral, 2 times daily   cholecalciferol (VITAMIN D3) 1,000 Units, Oral, Daily   Cinnamon 500 mg, Oral, Every morning   enoxaparin (LOVENOX) 80 mg, Subcutaneous, Every 12 hours   feeding supplement (ENSURE ENLIVE / ENSURE PLUS) LIQD 237 mLs, Oral, 2 times daily between meals   ferrous sulfate 325 mg, Oral, 2 times daily   finasteride (PROSCAR) 5 mg, Oral, Daily  gabapentin (NEURONTIN) 100-300 mg, Oral, See admin instructions, Take 1 capsule (100 mg) by mouth in the morning and take 3 capsules (300 mg) at bedtime   Multiple Vitamins-Minerals (CENTRUM SILVER ADULT 50+) TABS 1 tablet, Oral, Daily   oxyCODONE (OXY IR/ROXICODONE) 5 mg, Oral, Every 4 hours PRN   rosuvastatin (CRESTOR) 10 mg, Oral, Daily   silodosin (RAPAFLO) 8 mg, Oral, Daily   tamsulosin (FLOMAX) 0.8 mg, Oral, Daily after breakfast    traZODone (DESYREL) 50 mg, Oral, At bedtime PRN   warfarin (COUMADIN) 5 mg, Oral, Daily-1600, Until seen by warfarin clinic.    Diet Orders (From admission, onward)     Start     Ordered   11/08/21 2224  Diet Heart Room service appropriate? Yes; Fluid consistency: Thin  Diet effective now       Question Answer Comment  Room service appropriate? Yes   Fluid consistency: Thin      11/08/21 2223            DVT prophylaxis:    Lab Results  Component Value Date   PLT 136 (L) 11/09/2021      Code Status: Full Code  Family Communication: no family at bedside  Status is: Observation  The patient will require care spanning > 2 midnights and should be moved to inpatient because: Febrile, Persistent weakness   Level of care: Med-Surg  Consultants:  none  Objective: Vitals:   11/09/21 0700 11/09/21 0724 11/09/21 0912 11/09/21 1007  BP: 137/74 (!) 146/76  (!) 129/90  Pulse:  79  83  Resp:  16  16  Temp:  (!) 101.4 F (38.6 C) (!) 101 F (38.3 C) 99.3 F (37.4 C)  TempSrc:  Oral Oral Oral  SpO2:  94%  95%    Intake/Output Summary (Last 24 hours) at 11/09/2021 1151 Last data filed at 11/09/2021 2376 Gross per 24 hour  Intake 100 ml  Output 400 ml  Net -300 ml   Wt Readings from Last 3 Encounters:  11/08/21 77.1 kg  10/16/21 78.6 kg  09/23/21 81.6 kg    Examination:  Constitutional: NAD Eyes: no scleral icterus ENMT: Mucous membranes are moist.  Neck: normal, supple Respiratory: clear to auscultation bilaterally, no wheezing, no crackles. Normal respiratory effort. No accessory muscle use.  Cardiovascular: irregular, soft SEM, no edema Abdomen: non distended, no tenderness. Bowel sounds positive.  Skin: no rashes  Data Reviewed: I have independently reviewed following labs and imaging studies   CBC Recent Labs  Lab 11/08/21 0021 11/08/21 1716 11/09/21 0600  WBC 6.4 5.7 5.4  HGB 11.2* 11.5* 10.8*  HCT 35.6* 36.4* 34.2*  PLT 162 143* 136*   MCV 98.3 98.4 99.1  MCH 30.9 31.1 31.3  MCHC 31.5 31.6 31.6  RDW 15.0 15.2 15.4  LYMPHSABS 0.6* 0.5*  --   MONOABS 1.0 1.0  --   EOSABS 0.0 0.0  --   BASOSABS 0.0 0.0  --     Recent Labs  Lab 11/08/21 0021 11/08/21 0029 11/08/21 1716 11/09/21 0600  NA 140  --  140  --   K 3.5  --  3.9  --   CL 105  --  109  --   CO2 25  --  24  --   GLUCOSE 114*  --  118*  --   BUN 13  --  15  --   CREATININE 0.97  --  0.98  --   CALCIUM 9.5  --  9.2  --  AST 23  --   --   --   ALT 16  --   --   --   ALKPHOS 59  --   --   --   BILITOT 0.6  --   --   --   ALBUMIN 4.1  --   --   --   LATICACIDVEN  --  1.5  --   --   INR 2.4*  --  2.1* 1.9*    ------------------------------------------------------------------------------------------------------------------ No results for input(s): "CHOL", "HDL", "LDLCALC", "TRIG", "CHOLHDL", "LDLDIRECT" in the last 72 hours.  Lab Results  Component Value Date   HGBA1C 5.5 09/24/2021   ------------------------------------------------------------------------------------------------------------------ No results for input(s): "TSH", "T4TOTAL", "T3FREE", "THYROIDAB" in the last 72 hours.  Invalid input(s): "FREET3"  Cardiac Enzymes No results for input(s): "CKMB", "TROPONINI", "MYOGLOBIN" in the last 168 hours.  Invalid input(s): "CK" ------------------------------------------------------------------------------------------------------------------ No results found for: "BNP"  CBG: Recent Labs  Lab 11/08/21 1733  GLUCAP 118*    Recent Results (from the past 240 hour(s))  Blood Culture (routine x 2)     Status: None (Preliminary result)   Collection Time: 11/08/21 12:22 AM   Specimen: BLOOD  Result Value Ref Range Status   Specimen Description BLOOD SITE NOT SPECIFIED  Final   Special Requests   Final    BOTTLES DRAWN AEROBIC AND ANAEROBIC Blood Culture results may not be optimal due to an inadequate volume of blood received in culture  bottles   Culture   Final    NO GROWTH 1 DAY Performed at [facility], [place] [address] [place], [place] [address]    Report Status PENDING  Incomplete  Blood Culture (routine x 2)     Status: None (Preliminary result)   Collection Time: 11/08/21 12:37 AM   Specimen: BLOOD LEFT HAND  Result Value Ref Range Status   Specimen Description BLOOD LEFT HAND  Final   Special Requests   Final    BOTTLES DRAWN AEROBIC AND ANAEROBIC Blood Culture adequate volume   Culture   Final    NO GROWTH 1 DAY Performed at [facility], [place] [address], [place], [place] [address]    Report Status PENDING  Incomplete     Radiology Studies: [name]  Result Date: 11/09/2021 CLINICAL DATA:  Neuro deficit with acute stroke suspected EXAM: MRI HEAD WITHOUT CONTRAST TECHNIQUE: Multiplanar, multiecho pulse sequences of the brain and surrounding structures were obtained without intravenous contrast. COMPARISON:  Head CT from yesterday FINDINGS: Brain: No acute infarction, hemorrhage, hydrocephalus, or mass lesion. Sizable remote right MCA distribution infarct centered on the insula and lower sylvian fissure. Small remote right cerebellar infarct. Chronic small vessel ischemia in the cerebral white matter. Prominent extra-axial CSF spaces without discrete collection or displacement of cortical veins. Generalized atrophy. Few scattered chronic hemorrhagic insults are noted bilaterally. Vascular: Normal flow voids. Skull and upper cervical spine: Normal marrow signal. Sinuses/Orbits: Right mastoid opacification with negative nasopharynx, chronic. IMPRESSION: 1. No acute finding. 2. Chronic ischemia including remote right MCA branch infarct. Electronically Signed   By: Jorje Guild M.D.   On: 11/09/2021 06:55   CT Head Wo Contrast  Result Date: 11/08/2021 CLINICAL DATA:  Altered mental status. EXAM: CT HEAD WITHOUT CONTRAST TECHNIQUE: Contiguous axial images were obtained from the base of the  skull through the vertex without intravenous contrast. RADIATION DOSE REDUCTION: This exam was performed according to the departmental dose-optimization program which includes automated exposure control, adjustment of the mA  and/or kV according to patient size and/or use of iterative reconstruction technique. COMPARISON:  Same day. FINDINGS: Brain: Mild diffuse cortical atrophy is noted. Old right MCA infarction is again noted. No mass effect or midline shift is noted. Ventricular size is within normal limits. There is no evidence of mass lesion, hemorrhage or acute infarction. Vascular: No hyperdense vessel or unexpected calcification. Skull: Normal. Negative for fracture or focal lesion. Sinuses/Orbits: No acute finding. Other: None. IMPRESSION: No acute intracranial abnormality seen. Electronically Signed   By: Marijo Conception M.D.   On: 11/08/2021 18:01     Marzetta Board, MD, PhD [facility]  Between 7 am - 7 pm I am available, please contact me via Amion (for emergencies) or Securechat (non urgent messages)  Between 7 pm - 7 am I am not available, please contact night coverage MD/APP via Amion

## 2021-11-09 NOTE — Progress Notes (Signed)
ANTICOAGULATION CONSULT NOTE  Pharmacy Consult for warfarin Indication: atrial fibrillation  Allergies  Allergen Reactions   Sulfa Antibiotics Other (See Comments)    Granulocytosis   Zestril [Lisinopril] Cough    Patient Measurements:   Heparin Dosing Weight:   Vital Signs: Temp: 99.3 F (37.4 C) (11/02 1007) Temp Source: Oral (11/02 1007) BP: 129/90 (11/02 1007) Pulse Rate: 83 (11/02 1007)  Labs: Recent Labs    11/08/21 0021 11/08/21 1716 11/08/21 2222 11/09/21 0600  HGB 11.2* 11.5*  --  10.8*  HCT 35.6* 36.4*  --  34.2*  PLT 162 143*  --  136*  APTT 48* 56*  --   --   LABPROT 26.2* 23.5*  --  21.5*  INR 2.4* 2.1*  --  1.9*  CREATININE 0.97 0.98  --   --   TROPONINIHS  --  20* 21*  --      Estimated Creatinine Clearance: 61.2 mL/min (by C-G formula based on SCr of 0.98 mg/dL).  Medications:  Warfarin 7.'5mg'$  on Mondays and '5mg'$  all other days LD 11/1 @ 0900 per med rec Per coumadin clinic: 10/17/2021: 5 mg qday x 7.5 on Monday & Friday  Assessment: 86 yo male presents with a chief complaint of worsening generalized weakness. Past medical history otherwise significant for hypertension, aortic insufficiency, CAD, borderline diabetes, history of urinary retention, heart murmur, previous stroke, persistent A-fib.  Pharmacy consulted to dose warfarin.  Admission INR 2.1, Hgb 11.5, plts 143, Scr 0.98  11/09/2021 INR 1.9 is slightly below goal of 2-3; Hg 10.8: PLT 136  - stable No bleeding reported  Goal of Therapy:  INR 2-3   Plan:  Warfarin 7.5 mg po x 1 dose Daily INR  Eudelia Bunch, Pharm.D 11/09/2021 12:09 PM

## 2021-11-09 NOTE — ED Notes (Signed)
Patient transported to MRI 

## 2021-11-09 NOTE — Evaluation (Signed)
Physical Therapy Evaluation Patient Details Name: [name]. MRN: [id number] DOB: 14-Dec-1935 [name] Date: 11/09/2021  History of Present Illness  Pt is an 86yo male PMH: s/p L-TKA on 09/19/21,CAD s/p cath, gout, HTN, peripheral neuropathy, PAF s/p Maze, s/p aortic valve replacement, s/p tricuspid valve repair, hx of stroke, L-THA 2022 presents  to Ed 11/08/21 with weakness, febrile, had a fall. In [facility] ED for UTI, sent home on antibiotics  Clinical Impression  The patient admitted for above problems. Patient S/P LTKA, ambulatory in home,  attending OPPT 2 x's/week, lives with wife.  Patient is very weak, unable to stand from  stretcher at [facility] with 2  max/total  assist, knees buckled . Patient  felt warm to touch, CNA present, temp 99.4.  Patient reports feeling very weak.  Patient currently may benefit from SNF unless strength and balance improve to return to safe functional ambulation for home DC. Pt admitted with above diagnosis.  Pt currently with functional limitations due to the deficits listed below (see PT Problem List). Pt will benefit from skilled PT to increase their independence and safety with mobility to allow discharge to the venue listed below.        Recommendations for follow up therapy are one component of a multi-disciplinary discharge planning process, led by the attending physician.  Recommendations may be updated based on patient status, additional functional criteria and insurance authorization.  Follow Up Recommendations Skilled nursing-short term rehab (<3 hours/day) unless returns back to near baseline/ambulatory. Can patient physically be transported by private vehicle: No    Assistance Recommended at Discharge Frequent or constant Supervision/Assistance  Patient can return home with the following  A lot of help with walking and/or transfers;A lot of help with bathing/dressing/bathroom;Assistance with cooking/housework;Assist for transportation;Help with  stairs or ramp for entrance    Equipment Recommendations None recommended by PT  Recommendations for Other Services       Functional Status Assessment Patient has had a recent decline in their functional status and demonstrates the ability to make significant improvements in function in a reasonable and predictable amount of time.     Precautions / Restrictions Precautions Precautions: Fall;Knee Precaution Comments: LTKA 09/19/21      Mobility  Bed Mobility Overal bed mobility: Needs Assistance Bed Mobility: Supine to Sit, Sit to Supine Rolling: Mod assist   Supine to sit: Total assist Sit to supine: Max assist   General bed mobility comments: multimodal cues for rolling, pt assisted legs to bed edge, total assist to sit trunk upright, strong list to the left, gradually improved to sit with min assist and attempt standing    Transfers Overall transfer level: Needs assistance Equipment used: Rolling walker (2 wheels) Transfers: Sit to/from Stand Sit to Stand: Total assist, +2 physical assistance, +2 safety/equipment, From elevated surface           General transfer comment: attempted to stand x 4 times, feet slide forward, unable  to  get feet back under him, listing to the left when standing, attempted a step and knees buckled. Assisted back into bed    Ambulation/Gait                  Stairs            Wheelchair Mobility    Modified Rankin (Stroke Patients Only)       Balance Overall balance assessment: History of Falls, Needs assistance Sitting-balance support: Feet supported, Bilateral upper extremity supported Sitting balance-Leahy Scale: Poor  Sitting balance - Comments: much support initially to sit up, gradually able to sit near midline Postural control: Left lateral lean Standing balance support: Bilateral upper extremity supported, During functional activity, Reliant on assistive device for balance Standing balance-Leahy Scale:  Zero Standing balance comment: support to remain upright briefly                             Pertinent Vitals/Pain Pain Assessment Pain Assessment: Faces Faces Pain Scale: Hurts even more Pain Location: left leg and knee when flexed Pain Descriptors / Indicators: Discomfort, Grimacing, Guarding Pain Intervention(s): Monitored during session    Home Living Family/patient expects to be discharged to:: Private residence Living Arrangements: Spouse/significant other Available Help at Discharge: Family;Available 24 hours/day Type of Home: House Home Access: Stairs to enter Entrance Stairs-Rails: Right Entrance Stairs-Number of Steps: 3   Home Layout: Two level;Able to live on main level with bedroom/bathroom Home Equipment: Rolling Walker (2 wheels);Cane - single point;Shower seat;BSC/3in1;Grab bars - tub/shower      Prior Function               Mobility Comments: using RW, up ad lib in home ,OPPT 2 x week for TKA ADLs Comments: independnet     Hand Dominance   Dominant Hand: Right    Extremity/Trunk Assessment   Upper Extremity Assessment Upper Extremity Assessment: RUE deficits/detail RUE Deficits / Details: noted tremors,    Lower Extremity Assessment Lower Extremity Assessment: RLE deficits/detail;LLE deficits/detail RLE Deficits / Details: weakness for standing LLE Deficits / Details: knee flexion ~5-80    Cervical / Trunk Assessment Cervical / Trunk Assessment: Kyphotic  Communication   Communication: No difficulties  Cognition Arousal/Alertness: Awake/alert Behavior During Therapy: WFL for tasks assessed/performed Overall Cognitive Status: No family/caregiver present to determine baseline cognitive functioning Area of Impairment: Following commands, Awareness                       Following Commands: Follows one step commands with increased time       General Comments: not 2nd day        General Comments      Exercises      Assessment/Plan    PT Assessment Patient needs continued PT services  PT Problem List Decreased strength;Decreased mobility;Decreased safety awareness;Decreased range of motion;Decreased knowledge of precautions;Decreased activity tolerance;Decreased balance;Decreased knowledge of use of DME;Pain       PT Treatment Interventions DME instruction;Therapeutic activities;Gait training;Therapeutic exercise;Patient/family education;Functional mobility training;Balance training    PT Goals (Current goals can be found in the Care Plan section)  Acute Rehab PT Goals Patient Stated Goal: to go home PT Goal Formulation: With patient Time For Goal Achievement: 11/23/21 Potential to Achieve Goals: Good    Frequency Min 2X/week     Co-evaluation               AM-PAC PT "6 Clicks" Mobility  Outcome Measure Help needed turning from your back to your side while in a flat bed without using bedrails?: A Lot Help needed moving from lying on your back to sitting on the side of a flat bed without using bedrails?: Total Help needed moving to and from a bed to a chair (including a wheelchair)?: Total Help needed standing up from a chair using your arms (e.g., wheelchair or bedside chair)?: Total Help needed to walk in hospital room?: Total Help needed climbing 3-5 steps with a railing? : Total 6  Click Score: 7    End of Session Equipment Utilized During Treatment: Gait belt Activity Tolerance: Patient limited by fatigue Patient left: in bed;with call bell/phone within reach Nurse Communication: Mobility status;Need for lift equipment (sara stedy) PT Visit Diagnosis: Unsteadiness on feet (R26.81);History of falling (Z91.81);Muscle weakness (generalized) (M62.81);Difficulty in walking, not elsewhere classified (R26.2)    Time: 2787-1836 PT Time Calculation (min) (ACUTE ONLY): 22 min   Charges:   PT Evaluation $PT Eval Low Complexity: 1 Low          [place] Office [phone] Weekend [phone]   Claretha Cooper 11/09/2021, 10:35 AM

## 2021-11-10 DIAGNOSIS — R531 Weakness: Secondary | ICD-10-CM | POA: Diagnosis not present

## 2021-11-10 LAB — COMPREHENSIVE METABOLIC PANEL
ALT: 14 U/L (ref 0–44)
AST: 23 U/L (ref 15–41)
Albumin: 3.5 g/dL (ref 3.5–5.0)
Alkaline Phosphatase: 43 U/L (ref 38–126)
Anion gap: 11 (ref 5–15)
BUN: 16 mg/dL (ref 8–23)
CO2: 23 mmol/L (ref 22–32)
Calcium: 8.4 mg/dL — ABNORMAL LOW (ref 8.9–10.3)
Chloride: 105 mmol/L (ref 98–111)
Creatinine, Ser: 0.65 mg/dL (ref 0.61–1.24)
GFR, Estimated: >60 mL/min (ref >60)
Glucose, Bld: 134 mg/dL — ABNORMAL HIGH (ref 70–99)
Potassium: 3.3 mmol/L — ABNORMAL LOW (ref 3.5–5.1)
Sodium: 139 mmol/L (ref 135–145)
Total Bilirubin: 0.6 mg/dL (ref 0.3–1.2)
Total Protein: 7 g/dL (ref 6.5–8.1)

## 2021-11-10 LAB — CBC
HCT: 33.4 % — ABNORMAL LOW (ref 39.0–52.0)
Hemoglobin: 10.7 g/dL — ABNORMAL LOW (ref 13.0–17.0)
MCH: 30.9 pg (ref 26.0–34.0)
MCHC: 32 g/dL (ref 30.0–36.0)
MCV: 96.5 fL (ref 80.0–100.0)
Platelets: 140 10*3/uL — ABNORMAL LOW (ref 150–400)
RBC: 3.46 MIL/uL — ABNORMAL LOW (ref 4.22–5.81)
RDW: 15.1 % (ref 11.5–15.5)
WBC: 6.6 10*3/uL (ref 4.0–10.5)
nRBC: 0 % (ref 0.0–0.2)

## 2021-11-10 LAB — PROTIME-INR
INR: 2.1 — ABNORMAL HIGH (ref 0.8–1.2)
Prothrombin Time: 23.4 seconds — ABNORMAL HIGH (ref 11.4–15.2)

## 2021-11-10 LAB — MAGNESIUM: Magnesium: 2.1 mg/dL (ref 1.7–2.4)

## 2021-11-10 MED ORDER — SILODOSIN 4 MG PO CAPS
8.0000 mg | ORAL_CAPSULE | Freq: Every day | ORAL | Status: DC
  Administered 2021-11-11: 8 mg via ORAL
  Filled 2021-11-10 (×2): qty 2

## 2021-11-10 MED ORDER — GUAIFENESIN-DM 100-10 MG/5ML PO SYRP
5.0000 mL | ORAL_SOLUTION | ORAL | Status: DC | PRN
  Administered 2021-11-10 – 2021-11-11 (×2): 5 mL via ORAL
  Filled 2021-11-10 (×2): qty 10

## 2021-11-10 MED ORDER — NON FORMULARY: 8.0000 mg | Freq: Every day | Status: DC

## 2021-11-10 MED ORDER — WARFARIN SODIUM 5 MG PO TABS
5.0000 mg | ORAL_TABLET | Freq: Once | ORAL | Status: AC
  Administered 2021-11-10: 5 mg via ORAL
  Filled 2021-11-10: qty 1

## 2021-11-10 MED ORDER — POTASSIUM CHLORIDE CRYS ER 20 MEQ PO TBCR
40.0000 meq | EXTENDED_RELEASE_TABLET | Freq: Once | ORAL | Status: AC
  Administered 2021-11-10: 40 meq via ORAL
  Filled 2021-11-10: qty 2

## 2021-11-10 MED ORDER — CHLORHEXIDINE GLUCONATE CLOTH 2 % EX PADS
6.0000 | MEDICATED_PAD | Freq: Every day | CUTANEOUS | Status: DC
  Administered 2021-11-10 – 2021-11-12 (×3): 6 via TOPICAL

## 2021-11-10 NOTE — Progress Notes (Signed)
Patient vomited last night after administration of p.o. medications.

## 2021-11-10 NOTE — Progress Notes (Addendum)
Physical Therapy Treatment Patient Details Name: [name]. MRN: [id number] DOB: [birth year] [name] Date: 11/10/2021   History of Present Illness Pt is an 86yo male PMH: s/p L-TKA on 09/19/21,CAD s/p cath, gout, HTN, peripheral neuropathy, PAF s/p Maze, s/p aortic valve replacement, s/p tricuspid valve repair, hx of stroke, L-THA 2022 presents  to Ed 11/08/21 with weakness, febrile, had a fall. In [facility] ED for UTI, sent home on antibiotics    PT Comments    Pt appears to be much improved from yesterday per chart review, received supine in bed motivated to participate with therapy.  Pt's LLE did seem more swollen, red, and warmer than R in side-to-side comparison, pt agreed that his LLE seemed warm , RN notified. Required supervision for bed mobility, min guard for transfers, ambulation in hallway 367f, and stair training. Pt completed LLE exercises to maintain range of motion and only complained of pain with greater degrees of knee flexion. Emphasized completion of BLE exercises outside of therapy session, pt verbalized understanding. Emphasized and reinforced use of RW during recovery period secondary to recent fall and generalized weakness, pt verbalized understanding. Pt confirmed his wife can provide 24/7 supervision and light assistance so [place](see add[place]um below). We will continue to follow acutely.   ADDENDUM, 1832 11/10/21: spoke with pt's son, ACristie Ayers who informed this therapist that the pt's wife is not able to provide 24/7 supervision/assistance secondary to her health. ACristie Hemis not able to provide this level of assistance either. Since family cannot provide assistance and in light of recent fall history and generalized weakness, this therapist concurs with the evaluating therapist's recommendation of SNF-level therapies upon discharge, and have updated this recommendation accordingly below. The pt is agreeable to this plan. TOC team updated via secure  chat. [name] asked to be updated/included in plan of care decisions for his father and can be reached at: ALazaro [phone]    Recommendations for follow up therapy are one component of a multi-disciplinary discharge planning process, led by the attending physician.  Recommendations may be updated based on patient status, additional functional criteria and insurance authorization.  Follow Up Recommendations  SNF Can patient physically be transported by private vehicle: Yes   Assistance Recommended at Discharge Frequent or constant Supervision/Assistance  Patient can return home with the following Assistance with cooking/housework;Assist for transportation;Help with stairs or ramp for entrance;A little help with walking and/or transfers;A little help with bathing/dressing/bathroom   Equipment Recommendations  None recommended by PT    Recommendations for Other Services       Precautions / Restrictions Precautions Precautions: Fall;Knee Precaution Comments: LTKA 09/19/21 Restrictions Weight Bearing Restrictions: No Other Position/Activity Restrictions: WBAT     Mobility  Bed Mobility Overal bed mobility: Needs Assistance Bed Mobility: Supine to Sit, Sit to Supine     Supine to sit: Supervision Sit to supine: Supervision   General bed mobility comments: For safety only    Transfers Overall transfer level: Needs assistance Equipment used: Rolling walker (2 wheels) Transfers: Sit to/from Stand Sit to Stand: Min guard           General transfer comment: For safety only, no physical assist required    Ambulation/Gait Ambulation/Gait assistance: Min guard Gait Distance (Feet): 300 Feet Assistive device: Rolling walker (2 wheels) Gait Pattern/deviations: Step-to pattern Gait velocity: decreased     General Gait Details: Pt ambulated with RW and min guard, no physical assist required or overt LOB noted, first bout 1245f  seated rest with performance of HEP,  then 11f.   Stairs Stairs: Yes Stairs assistance: Min guard Stair Management: One rail Right, Step to pattern, Forwards Number of Stairs: 2 General stair comments: Pt demonstrated safe stair mobility, no overt LOB, min guard.   Wheelchair Mobility    Modified Rankin (Stroke Patients Only)       Balance Overall balance assessment: History of Falls, Needs assistance Sitting-balance support: Feet supported, No upper extremity supported Sitting balance-Leahy Scale: Good     Standing balance support: Bilateral upper extremity supported, During functional activity, Reliant on assistive device for balance Standing balance-Leahy Scale: Poor                              Cognition Arousal/Alertness: Awake/alert Behavior During Therapy: WFL for tasks assessed/performed Overall Cognitive Status: Within Functional Limits for tasks assessed                         Following Commands: Follows one step commands consistently   Awareness: Intellectual            Exercises Total Joint Exercises Ankle Circles/Pumps: AROM, Both, 20 reps Quad Sets: AROM, Left, 10 reps Heel Slides: AROM, Left, 10 reps Goniometric ROM: -5-100 by gross visual approximation    General Comments        Pertinent Vitals/Pain Pain Assessment Pain Assessment: 0-10 Pain Score: 6  Faces Pain Scale: Hurts even more Pain Location: left leg and knee when flexed Pain Descriptors / Indicators: Discomfort, Grimacing, Guarding Pain Intervention(s): Limited activity within patient's tolerance, Monitored during session, Repositioned    Home Living                          Prior Function            PT Goals (current goals can now be found in the care plan section) Acute Rehab PT Goals Patient Stated Goal: to go home PT Goal Formulation: With patient Time For Goal Achievement: 11/23/21 Potential to Achieve Goals: Good Progress towards PT goals: Progressing toward  goals    Frequency    Min 2X/week      PT Plan Current plan remains appropriate.     Co-evaluation              AM-PAC PT "6 Clicks" Mobility   Outcome Measure  Help needed turning from your back to your side while in a flat bed without using bedrails?: None Help needed moving from lying on your back to sitting on the side of a flat bed without using bedrails?: A Little Help needed moving to and from a bed to a chair (including a wheelchair)?: A Little Help needed standing up from a chair using your Ayers (e.g., wheelchair or bedside chair)?: A Little Help needed to walk in hospital room?: A Little Help needed climbing 3-5 steps with a railing? : A Little 6 Click Score: 19    End of Session Equipment Utilized During Treatment: Gait belt Activity Tolerance: No increased pain;Patient tolerated treatment well Patient left: in bed;with call bell/phone within reach;with bed alarm set Nurse Communication: Mobility status PT Visit Diagnosis: Unsteadiness on feet (R26.81);History of falling (Z91.81);Muscle weakness (generalized) (M62.81);Difficulty in walking, not elsewhere classified (R26.2) Pain - Right/Left: Left Pain - part of body: Knee     Time: 1436-1500 PT Time Calculation (min) (ACUTE ONLY): 24 min  Charges:  $  Gait Training: 8-22 mins $Therapeutic Exercise: 8-22 mins                     Coolidge Breeze, PT, DPT [facility] Department Office: [phone] Weekend pager: [phone]   Chip Canepa 11/10/2021, 6:39 PM

## 2021-11-10 NOTE — Progress Notes (Signed)
Spoke with patient's son and wife at bedside. Son states that patient's wife has some dementia and will not be able to care for patient 24/7 and provide needed supervision. Earnestine Mealing, therapist, sent secure chat update. Will continue to follow.

## 2021-11-10 NOTE — TOC Initial Note (Signed)
Transition of Care (TOC) - Initial/Assessment Note    Patient Details  Name: [name]. MRN: [id number] Date of Birth: 06/24/1935  Transition of Care [facility]) CM/SW Contact:    Lennart Pall, LCSW Phone Number: 11/10/2021, 3:56 PM  Clinical Narrative:                 Met with pt today to discuss possible dc needs.  Pt is awaiting a follow up session with PT today.  He is hopeful he is improved enough to dc home (instead of SNF).  He is aware TOC will follow along to assist with whichever plan is confirmed.    Expected Discharge Plan: [place] Barriers to Discharge: Continued Medical Work up   Patient Goals and CMS Choice        Expected Discharge Plan and Services Expected Discharge Plan: [place] In-house Referral: Clinical Social Work     Living arrangements for the past 2 months: [place]                                      Prior Living Arrangements/Services Living arrangements for the past 2 months: Single Family Home Lives with:: Spouse Patient language and need for interpreter reviewed:: Yes Do you feel safe going back to the place where you live?: Yes      Need for Family Participation in Patient Care: Yes (Comment) Care giver support system in place?: Yes (comment)   Criminal Activity/Legal Involvement Pertinent to Current Situation/Hospitalization: No - Comment as needed  Activities of Daily Living Home Assistive Devices/Equipment: Gilford Rile (specify type) ADL Screening (condition at time of admission) Patient's cognitive ability adequate to safely complete daily activities?: Yes Is the patient deaf or have difficulty hearing?: No Does the patient have difficulty seeing, even when wearing glasses/contacts?: No Does the patient have difficulty concentrating, remembering, or making decisions?: No Patient able to express need for assistance with ADLs?: Yes Does the patient have difficulty dressing or  bathing?: No Independently performs ADLs?: Yes (appropriate for developmental age) Does the patient have difficulty walking or climbing stairs?: Yes Weakness of Legs: Both Weakness of Arms/Hands: None  Permission Sought/Granted                  Emotional Assessment Appearance:: Appears stated age Attitude/Demeanor/Rapport: Engaged Affect (typically observed): Accepting Orientation: : Oriented to Self, Oriented to Place, Oriented to  Time, Oriented to Situation Alcohol / Substance Use: Not Applicable Psych Involvement: No (comment)  Admission diagnosis:  Weakness [R53.1] Acute cystitis without hematuria [N30.00] Generalized weakness [R53.1] Patient Active Problem List   Diagnosis Date Noted   Weakness 11/08/2021   Urinary catheter in place 11/08/2021   UTI (urinary tract infection) 11/08/2021   Hematuria 09/25/2021   Symptomatic anemia 09/23/2021   HLD (hyperlipidemia) 09/23/2021   S/P total knee arthroplasty, left 09/19/2021   Abnormal findings on diagnostic imaging of other specified body structures 06/02/2021   Hypoalbuminemia due to protein-calorie malnutrition ([place]) 05/29/2021   Valvular heart disease 05/28/2021   Acute CVA (cerebrovascular accident) ([place]) 05/21/2021   SIRS (systemic inflammatory response syndrome) ([place]) 05/21/2021   Generalized weakness 05/21/2021   Otitis externa 05/21/2021   CAD (coronary artery disease) 12/19/2020   Malnutrition of moderate degree 08/02/2020   S/P left total hip arthroplasty 08/02/2020   Closed left hip fracture, initial encounter ([place]) 08/01/2020   Chronic congestive  heart failure ([place]) 05/26/2020   Elevated PSA 05/26/2020   History of adenomatous polyp of colon 05/26/2020   History of anemia 05/26/2020   Hypercoagulable state ([place]) 05/26/2020   Impairment of balance 05/26/2020   Peripheral neuropathy 05/26/2020   Personal history of other malignant neoplasm of skin 05/26/2020   Prediabetes 05/26/2020   Presence of  prosthetic heart valve 05/26/2020   Sleep disorder 05/26/2020   Thrombocytopenia ([place]) 05/26/2020   Vitamin D deficiency 05/26/2020   Acquired dilation of ascending aorta and aortic root ([place])    Anemia 09/05/2018   Anemia due to chronic blood loss 09/05/2018   Long term current use of anticoagulant 02/10/2018   s/p mitral valve repair 01/30/2018   S/P tricuspid valve repair 01/30/2018   S/P Maze operation for atrial fibrillation 01/30/2018   Tricuspid regurgitation    Mitral valve regurgitation 10/14/2017   History of cerebrovascular accident 11/27/2016   Atrial fibrillation ([place])    Hyperglycemia    Right middle cerebral artery stroke ([place]) 41/66/0630   Acute embolic stroke (HCC)    Hypokalemia    Left hemiparesis (HCC)    Dysphagia, post-stroke    S/P AVR (aortic valve replacement)    Benign prostatic hyperplasia with lower urinary tract symptoms    CVA (cerebral vascular accident) ([place]) 10/24/2016   Essential hypertension    Gout    History of small bowel obstruction    History of BPH    S/P AVR 08/03/2010   Hypertrophy of prostate with urinary obstruction and other lower urinary tract symptoms (LUTS) 05/21/2001   PCP:  Marda Stalker, PA-C Pharmacy:   [place], [place] - [address] [place] [address] Phone: [phone] Fax: [phone]  [facility] ([place], [place] [facility] [place] [address] [place] [address] Phone: [phone] Fax: [place] [place] [address] Phone: [phone] Fax: [phone]     Social Determinants of Health (SDOH) Interventions    Readmission Risk Interventions    11/10/2021    3:54 PM  Readmission Risk Prevention Plan  Transportation Screening Complete  PCP or Specialist Appt within 3-5 Days Complete   HRI or [place] Complete  Social Work Consult for [place] Planning/Counseling Complete  Palliative Care Screening Not Applicable  Medication Review Press photographer) Complete

## 2021-11-10 NOTE — Progress Notes (Signed)
ANTICOAGULATION CONSULT NOTE  Pharmacy Consult for warfarin Indication: atrial fibrillation  Allergies  Allergen Reactions   Sulfa Antibiotics Other (See Comments)    Granulocytosis   Zestril [Lisinopril] Cough    Patient Measurements:   Heparin Dosing Weight:   Vital Signs: Temp: 99.3 F (37.4 C) (11/03 0525) Temp Source: Oral (11/03 0525) BP: 132/60 (11/03 0525) Pulse Rate: 75 (11/03 0525)  Labs: Recent Labs    11/08/21 0021 11/08/21 1716 11/08/21 2222 11/09/21 0600 11/10/21 0340  HGB 11.2* 11.5*  --  10.8* 10.7*  HCT 35.6* 36.4*  --  34.2* 33.4*  PLT 162 143*  --  136* 140*  APTT 48* 56*  --   --   --   LABPROT 26.2* 23.5*  --  21.5* 23.4*  INR 2.4* 2.1*  --  1.9* 2.1*  CREATININE 0.97 0.98  --   --  0.65  TROPONINIHS  --  20* 21*  --   --      Estimated Creatinine Clearance: 75 mL/min (by C-G formula based on SCr of 0.65 mg/dL).  Medications:  Warfarin 7.'5mg'$  on Mondays and '5mg'$  all other days LD 11/1 @ 0900 per med rec 11/2 7.5 mg dose @ 1600  Per coumadin clinic: 10/17/2021: 5 mg qday x 7.5 on Monday & Friday  Assessment: 86 yo male presents with a chief complaint of worsening generalized weakness. Past medical history otherwise significant for hypertension, aortic insufficiency, CAD, borderline diabetes, history of urinary retention, heart murmur, previous stroke, persistent A-fib.  Pharmacy consulted to dose warfarin.  Admission INR 2.1, Hgb 11.5, plts 143, Scr 0.98  11/10/2021 INR 2.1 within therapeutic goal range 2-3; Hg 10.7: PLT 140  - stable No bleeding reported  Goal of Therapy:  INR 2-3   Plan:  Warfarin 5 mg po x 1 dose Daily INR

## 2021-11-10 NOTE — Plan of Care (Signed)
  Problem: Education: Goal: Knowledge of General Education information will improve Description Including pain rating scale, medication(s)/side effects and non-pharmacologic comfort measures Outcome: Progressing   

## 2021-11-10 NOTE — Progress Notes (Signed)
PROGRESS NOTE  [name]. JXB:[id number] DOB: [birth year] DOA: 11/08/2021 PCP: Marda Stalker, PA-C   LOS: 1 day   Brief Narrative / Interim history: 86 year old male with AI status post bioprosthetic AVR, PFO closure in 2012, MV repair, TV repair, maze, LAA clipping, hypertension, permanent A-fib/flutter on Coumadin, history of CVA, prior GI bleed, CAD, chronic urinary retention with chronic Foley comes into the hospital with weakness.  He was walking to go to the bathroom when feels like his legs gave out.  No significant focal weakness but felt overall weak.  He came to the ED, was found to have a UTI, he was febrile and was admitted to the hospital.  An MRI of the brain was negative for acute strokes  Subjective / 24h Interval events: Feels a stronger.  Still low-grade temp last night of 100.1.  No nausea or vomiting  Assesement and Plan: Principal Problem:   Weakness Active Problems:   Generalized weakness   Essential hypertension   Atrial fibrillation (HCC)   CAD (coronary artery disease)   S/P AVR   s/p mitral valve repair   Urinary catheter in place   UTI (urinary tract infection)   Principal problem UTI, caused by chronic Foley-patient febrile to 101.4 on admission, however not tachycardic, tachypneic and white count is normal.  He does have a source with abnormal urinalysis.  Has been placed on ceftriaxone, continue.  Blood cultures negative so far.  Urine cultures with multiple species, final.  Given fever, favor continuing antibiotics.  Still had a low-grade temp of 100.1 last night, and overall his fever curve improving.  Suspect could transition to orals by tomorrow if he remains afebrile  Active problems Weakness -Likely due to UTI.  There was concern about worsening weakness, underwent an MRI of the brain which was negative for acute findings.  It did show chronic CVA in the right MCA branch infarct -PT recommends SNF, patient is not sure whether to go to  rehab versus home.  Awaiting PT reevaluation  Essential hypertension -remains normotensive this morning, continue to hold home medications given fever   CAD (coronary artery disease) -Non-obstructive disease at cath in 2019.  Continue statin   Atrial fibrillation -Permanent A.fib, INR therapeutic on presentation at 2.1.  Continue Coumadin per pharmacy   S/P AVR -bioprosthetic   S/p mitral and tricuspid valve repair -noted   Urinary catheter in place -Pt had urinary retention following left knee replacement in September. Has chronic indwelling catheter following urology.  Has an appointment next week   Anemia of chronic disease-hemoglobin stable, no bleeding  Thrombocytopenia-mild, monitor  Scheduled Meds:  allopurinol  150 mg Oral q morning   cholecalciferol  1,000 Units Oral Daily   feeding supplement  237 mL Oral Weekly   ferrous sulfate  325 mg Oral BID   finasteride  5 mg Oral Daily   gabapentin  100 mg Oral Daily   gabapentin  300 mg Oral QHS   rosuvastatin  10 mg Oral Daily   silodosin  8 mg Oral Q breakfast   warfarin  5 mg Oral ONCE-1600   Warfarin - Pharmacist Dosing Inpatient   Does not apply q1600   Continuous Infusions:  cefTRIAXone (ROCEPHIN)  IV 1 g (11/09/21 2153)   PRN Meds:.acetaminophen, traZODone  Current Outpatient Medications  Medication Instructions   acetaminophen (TYLENOL) 1,000 mg, Oral, Every 6 hours   allopurinol (ZYLOPRIM) 150 mg, Oral, Every morning   amLODipine (NORVASC) 10 MG tablet TAKE 1 TABLET BY  MOUTH DAILY   atenolol (TENORMIN) 12.5 mg, Oral, Daily   cephALEXin (KEFLEX) 500 mg, Oral, 2 times daily   cholecalciferol (VITAMIN D3) 1,000 Units, Oral, Daily   Cinnamon 500 mg, Oral, Every morning   enoxaparin (LOVENOX) 80 mg, Subcutaneous, Every 12 hours   feeding supplement (ENSURE ENLIVE / ENSURE PLUS) LIQD 237 mLs, Oral, 2 times daily between meals   ferrous sulfate 325 mg, Oral, 2 times daily   finasteride (PROSCAR) 5 mg, Oral, Daily    gabapentin (NEURONTIN) 100-300 mg, Oral, See admin instructions, Take 1 capsule (100 mg) by mouth in the morning and take 3 capsules (300 mg) at bedtime   Multiple Vitamins-Minerals (CENTRUM SILVER ADULT 50+) TABS 1 tablet, Oral, Daily   oxyCODONE (OXY IR/ROXICODONE) 5 mg, Oral, Every 4 hours PRN   rosuvastatin (CRESTOR) 10 mg, Oral, Daily   silodosin (RAPAFLO) 8 mg, Oral, Daily   tamsulosin (FLOMAX) 0.8 mg, Oral, Daily after breakfast   traZODone (DESYREL) 50 mg, Oral, At bedtime PRN   warfarin (COUMADIN) 5 mg, Oral, Daily-1600, Until seen by warfarin clinic.    Diet Orders (From admission, onward)     Start     Ordered   11/08/21 2224  Diet Heart Room service appropriate? Yes; Fluid consistency: Thin  Diet effective now       Question Answer Comment  Room service appropriate? Yes   Fluid consistency: Thin      11/08/21 2223            DVT prophylaxis:  warfarin (COUMADIN) tablet 5 mg   Lab Results  Component Value Date   PLT 140 (L) 11/10/2021      Code Status: Full Code  Family Communication: no family at bedside  Status is: Inpatient, low-grade temp, SNF   Level of care: Med-Surg  Consultants:  none  Objective: Vitals:   11/09/21 1715 11/09/21 2240 11/10/21 0238 11/10/21 0525  BP: 124/65 (!) 146/84 125/72 132/60  Pulse: 64 88 70 75  Resp: '18 17 17 17  '$ Temp: 99.9 F (37.7 C) 100.1 F (37.8 C) 99.8 F (37.7 C) 99.3 F (37.4 C)  TempSrc:   Oral Oral  SpO2: 95% 94% 95% 95%    Intake/Output Summary (Last 24 hours) at 11/10/2021 1017 Last data filed at 11/10/2021 0525 Gross per 24 hour  Intake 720 ml  Output 2001 ml  Net -1281 ml    Wt Readings from Last 3 Encounters:  11/08/21 77.1 kg  10/16/21 78.6 kg  09/23/21 81.6 kg    Examination:  Constitutional: NAD Eyes: lids and conjunctivae normal, no scleral icterus ENMT: mmm Neck: normal, supple Respiratory: clear to auscultation bilaterally, no wheezing, no crackles. Normal respiratory  effort.  Cardiovascular: Regular rate and rhythm, 3/6 SEM Abdomen: soft, no distention, no tenderness. Bowel sounds positive.  Skin: no rashes Neurologic: no focal deficits, equal strength  Data Reviewed: I have independently reviewed following labs and imaging studies   CBC Recent Labs  Lab 11/08/21 0021 11/08/21 1716 11/09/21 0600 11/10/21 0340  WBC 6.4 5.7 5.4 6.6  HGB 11.2* 11.5* 10.8* 10.7*  HCT 35.6* 36.4* 34.2* 33.4*  PLT 162 143* 136* 140*  MCV 98.3 98.4 99.1 96.5  MCH 30.9 31.1 31.3 30.9  MCHC 31.5 31.6 31.6 32.0  RDW 15.0 15.2 15.4 15.1  LYMPHSABS 0.6* 0.5*  --   --   MONOABS 1.0 1.0  --   --   EOSABS 0.0 0.0  --   --   BASOSABS 0.0 0.0  --   --  Recent Labs  Lab 11/08/21 0021 11/08/21 0029 11/08/21 1716 11/09/21 0600 11/10/21 0340  NA 140  --  140  --  139  K 3.5  --  3.9  --  3.3*  CL 105  --  109  --  105  CO2 25  --  24  --  23  GLUCOSE 114*  --  118*  --  134*  BUN 13  --  15  --  16  CREATININE 0.97  --  0.98  --  0.65  CALCIUM 9.5  --  9.2  --  8.4*  AST 23  --   --   --  23  ALT 16  --   --   --  14  ALKPHOS 59  --   --   --  43  BILITOT 0.6  --   --   --  0.6  ALBUMIN 4.1  --   --   --  3.5  MG  --   --   --   --  2.1  LATICACIDVEN  --  1.5  --   --   --   INR 2.4*  --  2.1* 1.9* 2.1*     ------------------------------------------------------------------------------------------------------------------ No results for input(s): "CHOL", "HDL", "LDLCALC", "TRIG", "CHOLHDL", "LDLDIRECT" in the last 72 hours.  Lab Results  Component Value Date   HGBA1C 5.5 09/24/2021   ------------------------------------------------------------------------------------------------------------------ No results for input(s): "TSH", "T4TOTAL", "T3FREE", "THYROIDAB" in the last 72 hours.  Invalid input(s): "FREET3"  Cardiac Enzymes No results for input(s): "CKMB", "TROPONINI", "MYOGLOBIN" in the last 168 hours.  Invalid input(s):  "CK" ------------------------------------------------------------------------------------------------------------------ No results found for: "BNP"  CBG: Recent Labs  Lab 11/08/21 1733  GLUCAP 118*     Recent Results (from the past 240 hour(s))  Blood Culture (routine x 2)     Status: None (Preliminary result)   Collection Time: 11/08/21 12:22 AM   Specimen: BLOOD  Result Value Ref Range Status   Specimen Description BLOOD SITE NOT SPECIFIED  Final   Special Requests   Final    BOTTLES DRAWN AEROBIC AND ANAEROBIC Blood Culture results may not be optimal due to an inadequate volume of blood received in culture bottles   Culture   Final    NO GROWTH 2 DAYS Performed at [facility], [place] [address], [place], [place] [address]    Report Status PENDING  Incomplete  Urine Culture     Status: Abnormal   Collection Time: 11/08/21 12:29 AM   Specimen: In/Out Cath Urine  Result Value Ref Range Status   Specimen Description IN/OUT CATH URINE  Final   Special Requests   Final    NONE Performed at [facility], [place] [address] [place], [place] [address]    Culture MULTIPLE SPECIES PRESENT, SUGGEST RECOLLECTION (A)  Final   Report Status 11/09/2021 FINAL  Final  Blood Culture (routine x 2)     Status: None (Preliminary result)   Collection Time: 11/08/21 12:37 AM   Specimen: BLOOD LEFT HAND  Result Value Ref Range Status   Specimen Description BLOOD LEFT HAND  Final   Special Requests   Final    BOTTLES DRAWN AEROBIC AND ANAEROBIC Blood Culture adequate volume   Culture   Final    NO GROWTH 2 DAYS Performed at [facility], [place] [address], [place], [place] [address]    Report Status PENDING  Incomplete     Radiology Studies: No results found.  Marzetta Board, MD, PhD [facility]  Between 7 am - 7 pm I am available, please contact me via Amion (for emergencies) or Securechat (non urgent messages)  Between 7 pm - 7 am I am not available, please  contact night coverage MD/APP via Amion

## 2021-11-11 DIAGNOSIS — U071 COVID-19: Secondary | ICD-10-CM | POA: Diagnosis not present

## 2021-11-11 HISTORY — DX: COVID-19: U07.1

## 2021-11-11 LAB — PROTIME-INR
INR: 2.3 — ABNORMAL HIGH (ref 0.8–1.2)
Prothrombin Time: 24.7 seconds — ABNORMAL HIGH (ref 11.4–15.2)

## 2021-11-11 LAB — BASIC METABOLIC PANEL
Anion gap: 9 (ref 5–15)
BUN: 14 mg/dL (ref 8–23)
CO2: 24 mmol/L (ref 22–32)
Calcium: 8.7 mg/dL — ABNORMAL LOW (ref 8.9–10.3)
Chloride: 109 mmol/L (ref 98–111)
Creatinine, Ser: 0.77 mg/dL (ref 0.61–1.24)
GFR, Estimated: >60 mL/min (ref >60)
Glucose, Bld: 101 mg/dL — ABNORMAL HIGH (ref 70–99)
Potassium: 3.6 mmol/L (ref 3.5–5.1)
Sodium: 142 mmol/L (ref 135–145)

## 2021-11-11 LAB — CBC
HCT: 33.7 % — ABNORMAL LOW (ref 39.0–52.0)
Hemoglobin: 10.8 g/dL — ABNORMAL LOW (ref 13.0–17.0)
MCH: 31.3 pg (ref 26.0–34.0)
MCHC: 32 g/dL (ref 30.0–36.0)
MCV: 97.7 fL (ref 80.0–100.0)
Platelets: 141 10*3/uL — ABNORMAL LOW (ref 150–400)
RBC: 3.45 MIL/uL — ABNORMAL LOW (ref 4.22–5.81)
RDW: 15 % (ref 11.5–15.5)
WBC: 4.3 10*3/uL (ref 4.0–10.5)
nRBC: 0 % (ref 0.0–0.2)

## 2021-11-11 LAB — URINE CULTURE: Culture: 50000 — AB

## 2021-11-11 LAB — MAGNESIUM: Magnesium: 2.3 mg/dL (ref 1.7–2.4)

## 2021-11-11 LAB — SARS CORONAVIRUS 2 BY RT PCR: SARS Coronavirus 2 by RT PCR: POSITIVE — AB

## 2021-11-11 MED ORDER — POLYETHYLENE GLYCOL 3350 17 G PO PACK
17.0000 g | PACK | Freq: Every day | ORAL | Status: DC
  Administered 2021-11-11 – 2021-11-12 (×2): 17 g via ORAL
  Filled 2021-11-11 (×2): qty 1

## 2021-11-11 MED ORDER — WARFARIN SODIUM 5 MG PO TABS
5.0000 mg | ORAL_TABLET | Freq: Once | ORAL | Status: AC
  Administered 2021-11-11: 5 mg via ORAL
  Filled 2021-11-11: qty 1

## 2021-11-11 MED ORDER — NIRMATRELVIR/RITONAVIR (PAXLOVID)TABLET
3.0000 | ORAL_TABLET | Freq: Two times a day (BID) | ORAL | Status: DC
  Administered 2021-11-11 – 2021-11-12 (×3): 3 via ORAL
  Filled 2021-11-11: qty 30

## 2021-11-11 NOTE — NC FL2 (Signed)
[place] LEVEL OF CARE SCREENING TOOL     IDENTIFICATION  Patient Name: [name]. Birthdate: [birth year] Sex: male Admission Date (Current Location): 11/08/2021  [facility] and [place] Number:  Herbalist and Address:  [facility],  [place], [place]      Provider Number: [id number]  Attending Physician Name and Address:  Gwynne Edinger, MD  Relative Name and Phone Number:  Adib, [place] [phone]  [phone]  Gentz,alexander Son   [phone]    Current Level of Care: Hospital Recommended Level of Care: [place] Prior Approval Number:    Date Approved/Denied:   PASRR Number: [id number]  Discharge Plan: SNF    Current Diagnoses: Patient Active Problem List   Diagnosis Date Noted   COVID-19 virus infection 11/11/2021   Weakness 11/08/2021   Urinary catheter in place 11/08/2021   Hematuria 09/25/2021   Symptomatic anemia 09/23/2021   HLD (hyperlipidemia) 09/23/2021   S/P total knee arthroplasty, left 09/19/2021   Abnormal findings on diagnostic imaging of other specified body structures 06/02/2021   Hypoalbuminemia due to protein-calorie malnutrition ([place]) 05/29/2021   Valvular heart disease 05/28/2021   Acute CVA (cerebrovascular accident) ([place]) 05/21/2021   SIRS (systemic inflammatory response syndrome) ([employer]) 05/21/2021   Generalized weakness 05/21/2021   Otitis externa 05/21/2021   CAD (coronary artery disease) 12/19/2020   Malnutrition of moderate degree 08/02/2020   S/P left total hip arthroplasty 08/02/2020   Closed left hip fracture, initial encounter ([place]) 08/01/2020   Chronic congestive heart failure ([place]) 05/26/2020   Elevated PSA 05/26/2020   History of adenomatous polyp of colon 05/26/2020   History of anemia 05/26/2020   Hypercoagulable state ([place]) 05/26/2020   Impairment of balance 05/26/2020   Peripheral neuropathy 05/26/2020   Personal history of  other malignant neoplasm of skin 05/26/2020   Prediabetes 05/26/2020   Presence of prosthetic heart valve 05/26/2020   Sleep disorder 05/26/2020   Thrombocytopenia ([place]) 05/26/2020   Vitamin D deficiency 05/26/2020   Acquired dilation of ascending aorta and aortic root (HCC)    Anemia 09/05/2018   Anemia due to chronic blood loss 09/05/2018   Long term current use of anticoagulant 02/10/2018   s/p mitral valve repair 01/30/2018   S/P tricuspid valve repair 01/30/2018   S/P Maze operation for atrial fibrillation 01/30/2018   Tricuspid regurgitation    Mitral valve regurgitation 10/14/2017   History of cerebrovascular accident 11/27/2016   Atrial fibrillation ([place])    Hyperglycemia    Right middle cerebral artery stroke ([place]) 53/66/4403   Acute embolic stroke (HCC)    Hypokalemia    Left hemiparesis (HCC)    Dysphagia, post-stroke    S/P AVR (aortic valve replacement)    Benign prostatic hyperplasia with lower urinary tract symptoms    CVA (cerebral vascular accident) ([place]) 10/24/2016   Essential hypertension    Gout    History of small bowel obstruction    History of BPH    S/P AVR 08/03/2010   Hypertrophy of prostate with urinary obstruction and other lower urinary tract symptoms (LUTS) 05/21/2001    Orientation RESPIRATION BLADDER Height & Weight     Self, Time, Situation, Place  Normal Continent Weight:   Height:     BEHAVIORAL SYMPTOMS/MOOD NEUROLOGICAL BOWEL NUTRITION STATUS      Continent Diet  AMBULATORY STATUS COMMUNICATION OF NEEDS Skin   Limited Assist Verbally Surgical wounds  Personal Care Assistance Level of Assistance  Bathing, Feeding, Dressing Bathing Assistance: Limited assistance Feeding assistance: Independent Dressing Assistance: Limited assistance     Functional Limitations Info  Sight, Hearing, Speech Sight Info: Adequate Hearing Info: Adequate Speech Info: Adequate    SPECIAL CARE FACTORS FREQUENCY  PT (By  licensed PT), OT (By licensed OT)     PT Frequency: Minimum 5x a week OT Frequency: Minimum 5x a week            Contractures Contractures Info: Not present    Additional Factors Info  Code Status, Allergies, Isolation Precautions Code Status Info: Full Code Allergies Info: Sulfa Antibiotics  Zestril (Lisinopril)     Isolation Precautions Info: Air/Contact Precautions  Covid+ as of 11/11/2021     Current Medications (11/11/2021):  This is the current hospital active medication list Current Facility-Administered Medications  Medication Dose Route Frequency Provider Last Rate Last Admin   acetaminophen (TYLENOL) tablet 650 mg  650 mg Oral Q6H PRN Tu, Ching T, DO   650 mg at 11/09/21 0745   allopurinol (ZYLOPRIM) tablet 150 mg  150 mg Oral q morning Tu, Ching T, DO   150 mg at 11/11/21 1224   Chlorhexidine Gluconate Cloth 2 % PADS 6 each  6 each Topical Daily Caren Griffins, MD   6 each at 11/11/21 1046   cholecalciferol (VITAMIN D3) 25 MCG (1000 UNIT) tablet 1,000 Units  1,000 Units Oral Daily Tu, Ching T, DO   1,000 Units at 11/11/21 0817   feeding supplement (ENSURE ENLIVE / ENSURE PLUS) liquid 237 mL  237 mL Oral Weekly Tu, Ching T, DO   237 mL at 11/08/21 2346   ferrous sulfate tablet 325 mg  325 mg Oral BID Tu, Ching T, DO   325 mg at 11/11/21 0816   finasteride (PROSCAR) tablet 5 mg  5 mg Oral Daily Tu, Ching T, DO   5 mg at 11/11/21 0817   gabapentin (NEURONTIN) capsule 100 mg  100 mg Oral Daily Tu, Ching T, DO   100 mg at 11/11/21 0817   gabapentin (NEURONTIN) capsule 300 mg  300 mg Oral QHS Tu, Ching T, DO   300 mg at 11/10/21 2154   guaiFENesin-dextromethorphan (ROBITUSSIN DM) 100-10 MG/5ML syrup 5 mL  5 mL Oral Q4H PRN Caren Griffins, MD   5 mL at 11/10/21 2157   nirmatrelvir/ritonavir EUA (PAXLOVID) 3 tablet  3 tablet Oral BID Gwynne Edinger, MD   3 tablet at 11/11/21 1446   polyethylene glycol (MIRALAX / GLYCOLAX) packet 17 g  17 g Oral Daily Gwynne Edinger,  MD   17 g at 11/11/21 1655   Warfarin - Pharmacist Dosing Inpatient   Does not apply q1600 Eudelia Bunch, [facility]   Given at 11/10/21 1551     Discharge Medications: Please see discharge summary for a list of discharge medications.  Relevant Imaging Results:  Relevant Lab Results:   Additional Information SSN [id number]  Ross Ludwig, LCSW

## 2021-11-11 NOTE — TOC Progression Note (Addendum)
Transition of Care (TOC) - Progression Note    Patient Details  Name: [name]. MRN: [id number] Date of Birth: 08/09/1935  Transition of [facility]) CM/SW Contact  Cecil Cobbs Phone Number: 11/11/2021, 12:07 PM  Clinical Narrative:     CSW spoke to patient and his son [name].  They are agreeable to going to SNF for rehab due to patient's wife having dementia, and now she is in the hospital with Covid.  CSW explained to patient and family there is a chance that he may not be approved based on how well he did with therapy.  CSW explained depending on what insurance says will determine if he can go home or go to SNF.  CSW explained that if he is denied there is a process that needs to be done to challenge the outcome.  Per patient and his family he has never been to SNF for rehab before, CSW explained the process and what to expect.  Patient and son are agreeable to see if any SNFs can accept patient.  CSW to fax out to SNFs in [facility].  2:30pm  CSW was informed that patient is now Covid+.  CSW updated patient and son that because patient is Covid+, SNFs will not accept patient until they are out of the isolation period of 10 days post Covid diagnosis.  CSW informed son that if patient improves enough, and he becomes medically stable for discharge, they may have to go home with home health verse going to a SNF.  Patient and son expressed understanding.  TOC to continue to follow patient's progress throughout discharge planning.    Expected Discharge Plan: [place] Barriers to Discharge: Continued Medical Work up  Expected Discharge Plan and Services Expected Discharge Plan: [place] In-house Referral: Clinical Social Work     Living arrangements for the past 2 months: Single Family Home                                       Social Determinants of Health (SDOH) Interventions    Readmission Risk Interventions     11/10/2021    3:54 PM  Readmission Risk Prevention Plan  Transportation Screening Complete  PCP or Specialist Appt within 3-5 Days Complete  HRI or [place] Complete  Social Work Consult for [place] Planning/Counseling Complete  Palliative Care Screening Not Applicable  Medication Review Press photographer) Complete

## 2021-11-11 NOTE — Progress Notes (Addendum)
PROGRESS NOTE  [name]. GDJ:[id number] DOB: 1935-02-18 DOA: 11/08/2021 PCP: Marda Stalker, PA-C   LOS: 2 days   Brief Narrative / Interim history: 86 year old male with AI status post bioprosthetic AVR, PFO closure in 2012, MV repair, TV repair, maze, LAA clipping, hypertension, permanent A-fib/flutter on Coumadin, history of CVA, prior GI bleed, CAD, chronic urinary retention with chronic Foley comes into the hospital with weakness.  He was walking to go to the bathroom when feels like his legs gave out.  No significant focal weakness but felt overall weak.  He came to the ED, was found to have a UTI, he was febrile and was admitted to the hospital.  An MRI of the brain was negative for acute strokes. Wife later tested positive for covid and so has the patient, so that appears to explain his symptoms, not uti. Antibiotics stopped  Subjective / 24h Interval events: Mild cough, no dyspnea. Tolerating PO. No suprapubic pain  Assesement and Plan: Principal Problem:   Weakness Active Problems:   Generalized weakness   Essential hypertension   Atrial fibrillation (HCC)   CAD (coronary artery disease)   S/P AVR   s/p mitral valve repair   Urinary catheter in place   UTI (urinary tract infection)   Principal problem Bacteriuria Initially thought to have uti as urine culture grew e coli but patient asymptomatic and has covid, do not think uti, will d/c abx  Covid Cough and weakness and fever. No hypoxia. Symptoms began 10/31. Wife tested positive this morning, tested patient and he is positive today 11/4,. - start paxlovid  Active problems Weakness - There was concern about worsening weakness, underwent an MRI of the brain which was negative for acute findings.  It did show chronic CVA in the right MCA branch infarct -PT recommends SNF but says patient has made progress. Son hesitant to take him home but patient wants to go home and he very much does not want to stay here  10 days for his isolation prior to going home.  - patient's wife is also here with covid, she's getting discharged today. Son can't take patient home this weekend but does think he could discharge on Monday.  Essential hypertension - cont amlodipine, monitor response while on paxlovid   CAD (coronary artery disease) -Non-obstructive disease at cath in 2019.  Hold rosuvastatin while on paxlovid   Atrial fibrillation -Permanent A.fib, INR therapeutic on presentation at 2.1.  Continue Coumadin per pharmacy. Monitor INR closely while on paxlovid   S/P AVR -bioprosthetic   S/p mitral and tricuspid valve repair -noted   Urinary catheter in place -Pt had urinary retention following left knee replacement in September. Has chronic indwelling catheter following urology.  Has an appointment next week. Hold alpha blockers while on paxlovid, start 3 days after finishing.    Anemia of chronic disease-hemoglobin stable, no bleeding  Thrombocytopenia-mild, monitor  Scheduled Meds:  allopurinol  150 mg Oral q morning   Chlorhexidine Gluconate Cloth  6 each Topical Daily   cholecalciferol  1,000 Units Oral Daily   feeding supplement  237 mL Oral Weekly   ferrous sulfate  325 mg Oral BID   finasteride  5 mg Oral Daily   gabapentin  100 mg Oral Daily   gabapentin  300 mg Oral QHS   rosuvastatin  10 mg Oral Daily   silodosin  8 mg Oral Q breakfast   warfarin  5 mg Oral ONCE-1600   Warfarin - Pharmacist Dosing Inpatient  Does not apply q1600   Continuous Infusions:  cefTRIAXone (ROCEPHIN)  IV Stopped (11/10/21 2236)   PRN Meds:.acetaminophen, guaiFENesin-dextromethorphan, traZODone  Current Outpatient Medications  Medication Instructions   acetaminophen (TYLENOL) 1,000 mg, Oral, Every 6 hours   allopurinol (ZYLOPRIM) 150 mg, Oral, Every morning   amLODipine (NORVASC) 10 MG tablet TAKE 1 TABLET BY MOUTH DAILY   atenolol (TENORMIN) 12.5 mg, Oral, Daily   cephALEXin (KEFLEX) 500 mg, Oral, 2 times  daily   cholecalciferol (VITAMIN D3) 1,000 Units, Oral, Daily   Cinnamon 500 mg, Oral, Every morning   enoxaparin (LOVENOX) 80 mg, Subcutaneous, Every 12 hours   feeding supplement (ENSURE ENLIVE / ENSURE PLUS) LIQD 237 mLs, Oral, 2 times daily between meals   ferrous sulfate 325 mg, Oral, 2 times daily   finasteride (PROSCAR) 5 mg, Oral, Daily   gabapentin (NEURONTIN) 100-300 mg, Oral, See admin instructions, Take 1 capsule (100 mg) by mouth in the morning and take 3 capsules (300 mg) at bedtime   Multiple Vitamins-Minerals (CENTRUM SILVER ADULT 50+) TABS 1 tablet, Oral, Daily   oxyCODONE (OXY IR/ROXICODONE) 5 mg, Oral, Every 4 hours PRN   rosuvastatin (CRESTOR) 10 mg, Oral, Daily   silodosin (RAPAFLO) 8 mg, Oral, Daily   tamsulosin (FLOMAX) 0.8 mg, Oral, Daily after breakfast   traZODone (DESYREL) 50 mg, Oral, At bedtime PRN   warfarin (COUMADIN) 5 mg, Oral, Daily-1600, Until seen by warfarin clinic.    Diet Orders (From admission, onward)     Start     Ordered   11/08/21 2224  Diet Heart Room service appropriate? Yes; Fluid consistency: Thin  Diet effective now       Question Answer Comment  Room service appropriate? Yes   Fluid consistency: Thin      11/08/21 2223            DVT prophylaxis:  warfarin (COUMADIN) tablet 5 mg   Lab Results  Component Value Date   PLT 141 (L) 11/11/2021      Code Status: Full Code  Family Communication: son updated telephonically 11/4  Status is: Inpatient, low-grade temp, SNF  Dispo: likely home on monday  Level of care: Med-Surg  Consultants:  none  Objective: Vitals:   11/10/21 0525 11/10/21 1405 11/10/21 2212 11/11/21 0552  BP: 132/60 129/72 (!) 153/80 (!) 159/79  Pulse: 75 72 86 77  Resp: '17 18 18 18  '$ Temp: 99.3 F (37.4 C) 98.8 F (37.1 C) 99.2 F (37.3 C) 98.6 F (37 C)  TempSrc: Oral Oral Oral Oral  SpO2: 95% 95% 97% 95%    Intake/Output Summary (Last 24 hours) at 11/11/2021 1339 Last data filed at  11/11/2021 1000 Gross per 24 hour  Intake 1160 ml  Output 875 ml  Net 285 ml   Wt Readings from Last 3 Encounters:  11/08/21 77.1 kg  10/16/21 78.6 kg  09/23/21 81.6 kg    Examination:  Constitutional: NAD Eyes: lids and conjunctivae normal, no scleral icterus ENMT: mmm Neck: normal, supple Respiratory: few rhonchi Cardiovascular: Regular rate and rhythm, 3/6 SEM Abdomen: soft, no distention, no tenderness. Bowel sounds positive.  Skin: no rashes Neurologic: no focal deficits  Data Reviewed: I have independently reviewed following labs and imaging studies   CBC Recent Labs  Lab 11/08/21 0021 11/08/21 1716 11/09/21 0600 11/10/21 0340 11/11/21 0348  WBC 6.4 5.7 5.4 6.6 4.3  HGB 11.2* 11.5* 10.8* 10.7* 10.8*  HCT 35.6* 36.4* 34.2* 33.4* 33.7*  PLT 162 143* 136* 140* 141*  MCV 98.3 98.4 99.1 96.5 97.7  MCH 30.9 31.1 31.3 30.9 31.3  MCHC 31.5 31.6 31.6 32.0 32.0  RDW 15.0 15.2 15.4 15.1 15.0  LYMPHSABS 0.6* 0.5*  --   --   --   MONOABS 1.0 1.0  --   --   --   EOSABS 0.0 0.0  --   --   --   BASOSABS 0.0 0.0  --   --   --     Recent Labs  Lab 11/08/21 0021 11/08/21 0029 11/08/21 1716 11/09/21 0600 11/10/21 0340 11/11/21 0348  NA 140  --  140  --  139 142  K 3.5  --  3.9  --  3.3* 3.6  CL 105  --  109  --  105 109  CO2 25  --  24  --  23 24  GLUCOSE 114*  --  118*  --  134* 101*  BUN 13  --  15  --  16 14  CREATININE 0.97  --  0.98  --  0.65 0.77  CALCIUM 9.5  --  9.2  --  8.4* 8.7*  AST 23  --   --   --  23  --   ALT 16  --   --   --  14  --   ALKPHOS 59  --   --   --  43  --   BILITOT 0.6  --   --   --  0.6  --   ALBUMIN 4.1  --   --   --  3.5  --   MG  --   --   --   --  2.1 2.3  LATICACIDVEN  --  1.5  --   --   --   --   INR 2.4*  --  2.1* 1.9* 2.1* 2.3*    ------------------------------------------------------------------------------------------------------------------ No results for input(s): "CHOL", "HDL", "LDLCALC", "TRIG", "CHOLHDL",  "LDLDIRECT" in the last 72 hours.  Lab Results  Component Value Date   HGBA1C 5.5 09/24/2021   ------------------------------------------------------------------------------------------------------------------ No results for input(s): "TSH", "T4TOTAL", "T3FREE", "THYROIDAB" in the last 72 hours.  Invalid input(s): "FREET3"  Cardiac Enzymes No results for input(s): "CKMB", "TROPONINI", "MYOGLOBIN" in the last 168 hours.  Invalid input(s): "CK" ------------------------------------------------------------------------------------------------------------------ No results found for: "BNP"  CBG: Recent Labs  Lab 11/08/21 1733  GLUCAP 118*    Recent Results (from the past 240 hour(s))  Blood Culture (routine x 2)     Status: None (Preliminary result)   Collection Time: 11/08/21 12:22 AM   Specimen: BLOOD  Result Value Ref Range Status   Specimen Description BLOOD SITE NOT SPECIFIED  Final   Special Requests   Final    BOTTLES DRAWN AEROBIC AND ANAEROBIC Blood Culture results may not be optimal due to an inadequate volume of blood received in culture bottles   Culture   Final    NO GROWTH 3 DAYS Performed at [facility], [place] [address] [place], [place] [address]    Report Status PENDING  Incomplete  Urine Culture     Status: Abnormal   Collection Time: 11/08/21 12:29 AM   Specimen: In/Out Cath Urine  Result Value Ref Range Status   Specimen Description IN/OUT CATH URINE  Final   Special Requests   Final    NONE Performed at [facility], [place] [address] [place],  [address]    Culture 50,000 COLONIES/mL ESCHERICHIA COLI (A)  Final   Report Status  11/11/2021 FINAL  Final   Organism ID, Bacteria ESCHERICHIA COLI (A)  Final      Susceptibility   Escherichia coli - MIC*    AMPICILLIN <=2 SENSITIVE Sensitive     CEFAZOLIN <=4 SENSITIVE Sensitive     CEFEPIME <=0.12 SENSITIVE Sensitive     CEFTRIAXONE <=0.25 SENSITIVE Sensitive     CIPROFLOXACIN <=0.25  SENSITIVE Sensitive     GENTAMICIN <=1 SENSITIVE Sensitive     IMIPENEM <=0.25 SENSITIVE Sensitive     NITROFURANTOIN <=16 SENSITIVE Sensitive     TRIMETH/SULFA <=20 SENSITIVE Sensitive     AMPICILLIN/SULBACTAM <=2 SENSITIVE Sensitive     PIP/TAZO <=4 SENSITIVE Sensitive     * 50,000 COLONIES/mL ESCHERICHIA COLI  Blood Culture (routine x 2)     Status: None (Preliminary result)   Collection Time: 11/08/21 12:37 AM   Specimen: BLOOD LEFT HAND  Result Value Ref Range Status   Specimen Description BLOOD LEFT HAND  Final   Special Requests   Final    BOTTLES DRAWN AEROBIC AND ANAEROBIC Blood Culture adequate volume   Culture   Final    NO GROWTH 3 DAYS Performed at [facility], [address] [place], [place] [address]    Report Status PENDING  Incomplete  SARS Coronavirus 2 by RT PCR (hospital order, performed in [facility] hospital lab) *cepheid single result test* Anterior Nasal Swab     Status: Abnormal   Collection Time: 11/11/21 10:31 AM   Specimen: Anterior Nasal Swab  Result Value Ref Range Status   SARS Coronavirus 2 by RT PCR POSITIVE (A) NEGATIVE Final    Comment: (NOTE) SARS-CoV-2 target nucleic acids are DETECTED  SARS-CoV-2 RNA is generally detectable in upper respiratory specimens  during the acute phase of infection.  Positive results are indicative  of the presence of the identified virus, but do not rule out bacterial infection or co-infection with other pathogens not detected by the test.  Clinical correlation with patient history and  other diagnostic information is necessary to determine patient infection status.  The expected result is negative.  Fact Sheet for Patients:   https://www.patel.info/   Fact Sheet for Healthcare Providers:   https://hall.com/    This test is not yet approved or cleared by the [place] FDA and  has been authorized for detection and/or diagnosis of SARS-CoV-2 by FDA under  an Emergency Use Authorization (EUA).  This EUA will remain in effect (meaning this test can be used) for the duration of  the COVID-19 declaration under Section 564(b)(1)  of the Act, 21 U.S.C. section 360-bbb-3(b)(1), unless the authorization is terminated or revoked sooner.   Performed at [facility], [place] [address] [place], [place] [address]      Radiology Studies: No results found.  Laurey Arrow, MD [facility]  Between 7 am - 7 pm I am available, please contact me via [place] (for emergencies) or Securechat (non urgent messages)  Between 7 pm - 7 am I am not available, please contact night coverage MD/APP via Amion

## 2021-11-11 NOTE — Plan of Care (Signed)
  Problem: Education: Goal: Knowledge of General Education information will improve Description: Including pain rating scale, medication(s)/side effects and non-pharmacologic comfort measures Outcome: Progressing   Problem: Activity: Goal: Risk for activity intolerance will decrease Outcome: Progressing   Problem: Pain Managment: Goal: General experience of comfort will improve Outcome: Progressing   

## 2021-11-11 NOTE — Progress Notes (Signed)
ANTICOAGULATION CONSULT NOTE  Pharmacy Consult for warfarin Indication: atrial fibrillation  Allergies  Allergen Reactions   Sulfa Antibiotics Other (See Comments)    Granulocytosis   Zestril [Lisinopril] Cough    Patient Measurements:    Vital Signs: Temp: 98.6 F (37 C) (11/04 0552) Temp Source: Oral (11/04 0552) BP: 159/79 (11/04 0552) Pulse Rate: 77 (11/04 0552)  Labs: Recent Labs    11/08/21 1716 11/08/21 2222 11/09/21 0600 11/10/21 0340 11/11/21 0348  HGB 11.5*  --  10.8* 10.7* 10.8*  HCT 36.4*  --  34.2* 33.4* 33.7*  PLT 143*  --  136* 140* 141*  APTT 56*  --   --   --   --   LABPROT 23.5*  --  21.5* 23.4* 24.7*  INR 2.1*  --  1.9* 2.1* 2.3*  CREATININE 0.98  --   --  0.65  --   TROPONINIHS 20* 21*  --   --   --      Estimated Creatinine Clearance: 75 mL/min (by C-G formula based on SCr of 0.65 mg/dL).  Medications:  Warfarin 7.'5mg'$  on Mondays and '5mg'$  all other days LD 11/1 @ 0900 per med rec Per coumadin clinic: 10/17/2021: 5 mg qday x 7.5 on Monday & Friday  Assessment: 86 yo male presents with a chief complaint of worsening generalized weakness. Past medical history otherwise significant for hypertension, aortic insufficiency, CAD, borderline diabetes, history of urinary retention, heart murmur, previous stroke, persistent A-fib.  Pharmacy consulted to dose warfarin. Admission INR 2.1  11/11/2021 INR 2.3, therapeutic CBC: Hg low/stable at 10.8, Plt low/stable at 141 No bleeding reported, PO diet, no major drug-drug interactions.  Goal of Therapy:  INR 2-3   Plan:  Warfarin 5 mg po x 1 dose Daily INR   Gretta Arab PharmD, BCPS [facility] main pharmacy [phone] 11/11/2021 11:33 AM

## 2021-11-12 ENCOUNTER — Telehealth (HOSPITAL_BASED_OUTPATIENT_CLINIC_OR_DEPARTMENT_OTHER): Payer: Self-pay | Admitting: *Deleted

## 2021-11-12 DIAGNOSIS — U071 COVID-19: Secondary | ICD-10-CM | POA: Diagnosis not present

## 2021-11-12 LAB — PROTIME-INR
INR: 2.5 — ABNORMAL HIGH (ref 0.8–1.2)
Prothrombin Time: 27.1 seconds — ABNORMAL HIGH (ref 11.4–15.2)

## 2021-11-12 MED ORDER — NIRMATRELVIR/RITONAVIR (PAXLOVID)TABLET: 3.0000 | ORAL_TABLET | Freq: Two times a day (BID) | ORAL | 0 refills | Status: AC

## 2021-11-12 NOTE — Discharge Summary (Signed)
Physician Discharge Summary  [name]. IOM:[id number] DOB: 09-Jul-1935 DOA: 11/08/2021  PCP: Marda Stalker, PA-C  Admit date: 11/08/2021 Discharge date: 11/12/2021 30 Day Unplanned Readmission Risk Score    Flowsheet Row ED to Hosp-Admission (Current) from 11/08/2021 in [place]  30 Day Unplanned Readmission Risk Score (%) 25.43 Filed at 11/12/2021 0801       This score is the patient's risk of an unplanned readmission within 30 days of being discharged (0 -100%). The score is based on dignosis, age, lab data, medications, orders, and past utilization.   Low:  0-14.9   Medium: 15-21.9   High: 22-29.9   Extreme: 30 and above          Admitted From: Home Disposition: Home  Recommendations for Outpatient Follow-up:  Follow up with PCP in 1-2 weeks Please obtain BMP/CBC in one week Please follow up with your PCP on the following pending results: Unresulted Labs (From admission, onward)     Start     Ordered   11/09/21 0500  Protime-INR  Daily at 5am,   R      11/08/21 [place]: Yes Equipment/Devices: None  Discharge Condition: Stable CODE STATUS: Full code Diet recommendation: Cardiac  Subjective: Patient seen and examined this morning.  He has no complaints.  He wants to go home.  He declined SNF as recommended by PT OT because he does not want to stay in the hospital for 8-9 more days and he feels that he is capable and strong enough that he can go home.  I spoke to his son, the son was initially reluctant to take him home because he was also taking care of his mother however the son said that he will come later and will further discuss with the patient.  The patient himself was very eager to go home.  He also called his son in front of me in his room and told him that he would like to come home and he can take care of himself.  Once the son came to the hospital, they talked between themselves and decided that they  would like to go home.  Patient is being discharged in stable condition.  Brief/Interim Summary: 86 year old male with AI status post bioprosthetic AVR, PFO closure in 2012, MV repair, TV repair, maze, LAA clipping, hypertension, permanent A-fib/flutter on Coumadin, history of CVA, prior GI bleed, CAD, chronic urinary retention with chronic Foley comes into the hospital with weakness.  He was walking to go to the bathroom when feels like his legs gave out.  No significant focal weakness but felt overall weak.  He came to the ED, was found to have a UTI, he was febrile and was admitted to the hospital.  An MRI of the brain was negative for acute strokes. Wife later tested positive for covid and so has the patient, so that appears to explain his symptoms, not uti. Antibiotics stopped   Bacteriuria Initially thought to have uti as urine culture grew e coli but patient asymptomatic and has covid, antibiotics were discontinued.   COVID-19 infection Cough and weakness and fever. No hypoxia. Symptoms began 10/31. Wife tested positive.  He was started on Paxlovid.  He has not had any fever for 24 hours and he is not hypoxic.  Wants to go home.  Will discharge on 4 more days of Paxlovid.   Weakness - There was concern about  worsening weakness, underwent an MRI of the brain which was negative for acute findings.  It did show chronic CVA in the right MCA branch infarct -PT recommends SNF but says patient has made progress.  Patient does not want to stay in the hospital for 8 days before he can go to SNF and he has decided to go home.  I will order home health.    Essential hypertension - cont amlodipine, monitor response while on paxlovid   CAD (coronary artery disease) -Non-obstructive disease at cath in 2019.  Hold rosuvastatin while on paxlovid   Atrial fibrillation -Permanent A.fib, INR therapeutic we will resume home medications.   S/P AVR -bioprosthetic   S/p mitral and tricuspid valve repair  -noted   Urinary catheter in place -Pt had urinary retention following left knee replacement in September. Has chronic indwelling catheter following urology.  Has an appointment next week.    Anemia of chronic disease-hemoglobin stable, no bleeding   Thrombocytopenia-mild, and chronic.  Discharge plan was discussed with patient and/or family member and they verbalized understanding and agreed with it.  Discharge Diagnoses:  Principal Problem:   COVID-19 virus infection Active Problems:   Generalized weakness   Essential hypertension   Atrial fibrillation (HCC)   CAD (coronary artery disease)   S/P AVR   s/p mitral valve repair   Weakness   Urinary catheter in place    Discharge Instructions   Allergies as of 11/12/2021       Reactions   Sulfa Antibiotics Other (See Comments)   Granulocytosis   Zestril [lisinopril] Cough        Medication List     STOP taking these medications    cephALEXin 500 MG capsule Commonly known as: KEFLEX   enoxaparin 80 MG/0.8ML injection Commonly known as: LOVENOX   oxyCODONE 5 MG immediate release tablet Commonly known as: Oxy IR/ROXICODONE       TAKE these medications    acetaminophen 500 MG tablet Commonly known as: TYLENOL Take 2 tablets (1,000 mg total) by mouth every 6 (six) hours.   allopurinol 300 MG tablet Commonly known as: ZYLOPRIM Take 150 mg by mouth every morning.   amLODipine 10 MG tablet Commonly known as: NORVASC TAKE 1 TABLET BY MOUTH DAILY   atenolol 25 MG tablet Commonly known as: TENORMIN Take 0.5 tablets (12.5 mg total) by mouth daily.   Centrum Silver Adult 50+ Tabs Take 1 tablet by mouth daily.   cholecalciferol 25 MCG (1000 UNIT) tablet Commonly known as: VITAMIN D3 Take 1,000 Units by mouth daily.   Cinnamon 500 MG capsule Take 500 mg by mouth every morning.   feeding supplement Liqd Take 237 mLs by mouth 2 (two) times daily between meals. What changed: when to take this   ferrous  sulfate 325 (65 FE) MG EC tablet Take 325 mg by mouth 2 (two) times daily.   finasteride 5 MG tablet Commonly known as: PROSCAR Take 1 tablet (5 mg total) by mouth daily.   gabapentin 100 MG capsule Commonly known as: NEURONTIN Take 100-300 mg by mouth See admin instructions. Take 1 capsule (100 mg) by mouth in the morning and take 3 capsules (300 mg) at bedtime   nirmatrelvir/ritonavir EUA 20 x 150 MG & 10 x '100MG'$  Tabs Commonly known as: PAXLOVID Take 3 tablets by mouth 2 (two) times daily for 4 days. Patient GFR is >60. Take nirmatrelvir (150 mg) two tablets twice daily for 5 days and ritonavir (100 mg) one tablet twice daily  for 5 days.   rosuvastatin 10 MG tablet Commonly known as: CRESTOR Take 1 tablet (10 mg total) by mouth daily.   silodosin 8 MG Caps capsule Commonly known as: RAPAFLO Take 8 mg by mouth daily.   tamsulosin 0.4 MG Caps capsule Commonly known as: FLOMAX Take 2 capsules (0.8 mg total) by mouth daily after breakfast.   traZODone 50 MG tablet Commonly known as: DESYREL Take 50 mg by mouth at bedtime as needed for sleep.   warfarin 5 MG tablet Commonly known as: COUMADIN Take as directed. If you are unsure how to take this medication, talk to your nurse or doctor. Original instructions: Take 1 tablet (5 mg total) by mouth daily at 4 PM. Until seen by warfarin clinic. What changed:  how much to take when to take this additional instructions        Follow-up Information     Marda Stalker, PA-C Follow up in 1 week(s).   Specialty: Family Medicine Contact information: [place] [address] [phone]                Allergies  Allergen Reactions   Sulfa Antibiotics Other (See Comments)    Granulocytosis   Zestril [Lisinopril] Cough    Consultations: None   Procedures/Studies: [name]  Result Date: 11/09/2021 CLINICAL DATA:  Neuro deficit with acute stroke suspected EXAM: MRI HEAD WITHOUT  CONTRAST TECHNIQUE: Multiplanar, multiecho pulse sequences of the brain and surrounding structures were obtained without intravenous contrast. COMPARISON:  Head CT from yesterday FINDINGS: Brain: No acute infarction, hemorrhage, hydrocephalus, or mass lesion. Sizable remote right MCA distribution infarct centered on the insula and lower sylvian fissure. Small remote right cerebellar infarct. Chronic small vessel ischemia in the cerebral white matter. Prominent extra-axial CSF spaces without discrete collection or displacement of cortical veins. Generalized atrophy. Few scattered chronic hemorrhagic insults are noted bilaterally. Vascular: Normal flow voids. Skull and upper cervical spine: Normal marrow signal. Sinuses/Orbits: Right mastoid opacification with negative nasopharynx, chronic. IMPRESSION: 1. No acute finding. 2. Chronic ischemia including remote right MCA branch infarct. Electronically Signed   By: Jorje Guild M.D.   On: 11/09/2021 06:55   CT Head Wo Contrast  Result Date: 11/08/2021 CLINICAL DATA:  Altered mental status. EXAM: CT HEAD WITHOUT CONTRAST TECHNIQUE: Contiguous axial images were obtained from the base of the skull through the vertex without intravenous contrast. RADIATION DOSE REDUCTION: This exam was performed according to the departmental dose-optimization program which includes automated exposure control, adjustment of the mA and/or kV according to patient size and/or use of iterative reconstruction technique. COMPARISON:  Same day. FINDINGS: Brain: Mild diffuse cortical atrophy is noted. Old right MCA infarction is again noted. No mass effect or midline shift is noted. Ventricular size is within normal limits. There is no evidence of mass lesion, hemorrhage or acute infarction. Vascular: No hyperdense vessel or unexpected calcification. Skull: Normal. Negative for fracture or focal lesion. Sinuses/Orbits: No acute finding. Other: None. IMPRESSION: No acute intracranial  abnormality seen. Electronically Signed   By: Marijo Conception M.D.   On: 11/08/2021 18:01   CT HEAD WO CONTRAST (5MM)  Result Date: 11/08/2021 CLINICAL DATA:  Fall EXAM: CT HEAD WITHOUT CONTRAST TECHNIQUE: Contiguous axial images were obtained from the base of the skull through the vertex without intravenous contrast. RADIATION DOSE REDUCTION: This exam was performed according to the departmental dose-optimization program which includes automated exposure control, adjustment of the mA and/or kV according to patient size  and/or use of iterative reconstruction technique. COMPARISON:  None Available. FINDINGS: Brain: Severe atrophy. Underlying chronic small vessel disease. Old right MCA infarct is unchanged. No acute intracranial abnormality. Specifically, no hemorrhage, hydrocephalus, mass lesion, acute infarction, or significant intracranial injury. Vascular: No hyperdense vessel or unexpected calcification. Skull: No acute calvarial abnormality. Sinuses/Orbits: No acute findings Other: None IMPRESSION: Old right MCA infarct. Atrophy, chronic microvascular disease. No acute intracranial abnormality. Electronically Signed   By: Rolm Baptise M.D.   On: 11/08/2021 02:20   DG Knee Complete 4 Views Left  Result Date: 11/08/2021 CLINICAL DATA:  Fall, leg pain EXAM: LEFT KNEE - COMPLETE 4+ VIEW COMPARISON:  None Available. FINDINGS: Prior left knee replacement. No hardware complicating feature. No acute bony abnormality. Specifically, no fracture, subluxation, or dislocation. No joint effusion. IMPRESSION: Prior left knee replacement.  No acute bony abnormality. Electronically Signed   By: Rolm Baptise M.D.   On: 11/08/2021 02:18   DG Chest Port 1 View  Result Date: 11/08/2021 CLINICAL DATA:  Questionable sepsis - evaluate for abnormality EXAM: PORTABLE CHEST 1 VIEW COMPARISON:  05/21/2021 FINDINGS: Prior CABG. Heart is borderline in size. No confluent airspace opacities, effusions or edema. No acute bony  abnormality. IMPRESSION: No active disease. Electronically Signed   By: Rolm Baptise M.D.   On: 11/08/2021 01:02     Discharge Exam: Vitals:   11/11/21 2230 11/12/21 0505  BP: 132/84 139/82  Pulse: (!) 58 67  Resp: 16 18  Temp: 98.4 F (36.9 C) 99.1 F (37.3 C)  SpO2: 96% 97%   Vitals:   11/11/21 2136 11/11/21 2230 11/12/21 0100 11/12/21 0505  BP: 133/83 132/84  139/82  Pulse: 75 (!) 58  67  Resp: '18 16  18  '$ Temp: 98.9 F (37.2 C) 98.4 F (36.9 C)  99.1 F (37.3 C)  TempSrc: Oral Oral  Oral  SpO2: 97% 96%  97%  Weight:   77.1 kg   Height:   6' (1.829 m)     General: Pt is alert, awake, not in acute distress Cardiovascular: RRR, S1/S2 +, no rubs, no gallops Respiratory: CTA bilaterally, no wheezing, no rhonchi Abdominal: Soft, NT, ND, bowel sounds + Extremities: no edema, no cyanosis    The results of significant diagnostics from this hospitalization (including imaging, microbiology, ancillary and laboratory) are listed below for reference.     Microbiology: Recent Results (from the past 240 hour(s))  Blood Culture (routine x 2)     Status: None (Preliminary result)   Collection Time: 11/08/21 12:22 AM   Specimen: BLOOD  Result Value Ref Range Status   Specimen Description BLOOD SITE NOT SPECIFIED  Final   Special Requests   Final    BOTTLES DRAWN AEROBIC AND ANAEROBIC Blood Culture results may not be optimal due to an inadequate volume of blood received in culture bottles   Culture   Final    NO GROWTH 4 DAYS Performed at [facility], [place] [address], [place], [place] [address]    Report Status PENDING  Incomplete  Urine Culture     Status: Abnormal   Collection Time: 11/08/21 12:29 AM   Specimen: In/Out Cath Urine  Result Value Ref Range Status   Specimen Description IN/OUT CATH URINE  Final   Special Requests   Final    NONE Performed at [facility], [place] [address], [place], [place] [address]    Culture 50,000 COLONIES/mL ESCHERICHIA COLI  (A)  Final   Report Status 11/11/2021  FINAL  Final   Organism ID, Bacteria ESCHERICHIA COLI (A)  Final      Susceptibility   Escherichia coli - MIC*    AMPICILLIN <=2 SENSITIVE Sensitive     CEFAZOLIN <=4 SENSITIVE Sensitive     CEFEPIME <=0.12 SENSITIVE Sensitive     CEFTRIAXONE <=0.25 SENSITIVE Sensitive     CIPROFLOXACIN <=0.25 SENSITIVE Sensitive     GENTAMICIN <=1 SENSITIVE Sensitive     IMIPENEM <=0.25 SENSITIVE Sensitive     NITROFURANTOIN <=16 SENSITIVE Sensitive     TRIMETH/SULFA <=20 SENSITIVE Sensitive     AMPICILLIN/SULBACTAM <=2 SENSITIVE Sensitive     PIP/TAZO <=4 SENSITIVE Sensitive     * 50,000 COLONIES/mL ESCHERICHIA COLI  Blood Culture (routine x 2)     Status: None (Preliminary result)   Collection Time: 11/08/21 12:37 AM   Specimen: BLOOD LEFT HAND  Result Value Ref Range Status   Specimen Description BLOOD LEFT HAND  Final   Special Requests   Final    BOTTLES DRAWN AEROBIC AND ANAEROBIC Blood Culture adequate volume   Culture   Final    NO GROWTH 4 DAYS Performed at [facility], [address] [place], [place] [address]    Report Status PENDING  Incomplete  SARS Coronavirus 2 by RT PCR (hospital order, performed in [facility] hospital lab) *cepheid single result test* Anterior Nasal Swab     Status: Abnormal   Collection Time: 11/11/21 10:31 AM   Specimen: Anterior Nasal Swab  Result Value Ref Range Status   SARS Coronavirus 2 by RT PCR POSITIVE (A) NEGATIVE Final    Comment: (NOTE) SARS-CoV-2 target nucleic acids are DETECTED  SARS-CoV-2 RNA is generally detectable in upper respiratory specimens  during the acute phase of infection.  Positive results are indicative  of the presence of the identified virus, but do not rule out bacterial infection or co-infection with other pathogens not detected by the test.  Clinical correlation with patient history and  other diagnostic information is necessary to determine patient infection status.  The  expected result is negative.  Fact Sheet for Patients:   https://www.patel.info/   Fact Sheet for Healthcare Providers:   https://hall.com/    This test is not yet approved or cleared by the [place] FDA and  has been authorized for detection and/or diagnosis of SARS-CoV-2 by FDA under an Emergency Use Authorization (EUA).  This EUA will remain in effect (meaning this test can be used) for the duration of  the COVID-19 declaration under Section 564(b)(1)  of the Act, 21 U.S.C. section 360-bbb-3(b)(1), unless the authorization is terminated or revoked sooner.   Performed at [facility], [place] [address], [place], [place] [address]      Labs: BNP (last 3 results) No results for input(s): "BNP" in the last 8760 hours. Basic Metabolic Panel: Recent Labs  Lab 11/08/21 0021 11/08/21 1716 11/10/21 0340 11/11/21 0348  NA 140 140 139 142  K 3.5 3.9 3.3* 3.6  CL 105 109 105 109  CO2 '25 24 23 24  '$ GLUCOSE 114* 118* 134* 101*  BUN '13 15 16 14  '$ CREATININE 0.97 0.98 0.65 0.77  CALCIUM 9.5 9.2 8.4* 8.7*  MG  --   --  2.1 2.3   Liver Function Tests: Recent Labs  Lab 11/08/21 0021 11/10/21 0340  AST 23 23  ALT 16 14  ALKPHOS 59 43  BILITOT 0.6 0.6  PROT 7.7 7.0  ALBUMIN 4.1 3.5   No results  for input(s): "LIPASE", "AMYLASE" in the last 168 hours. No results for input(s): "AMMONIA" in the last 168 hours. CBC: Recent Labs  Lab 11/08/21 0021 11/08/21 1716 11/09/21 0600 11/10/21 0340 11/11/21 0348  WBC 6.4 5.7 5.4 6.6 4.3  NEUTROABS 4.8 4.2  --   --   --   HGB 11.2* 11.5* 10.8* 10.7* 10.8*  HCT 35.6* 36.4* 34.2* 33.4* 33.7*  MCV 98.3 98.4 99.1 96.5 97.7  PLT 162 143* 136* 140* 141*   Cardiac Enzymes: No results for input(s): "CKTOTAL", "CKMB", "CKMBINDEX", "TROPONINI" in the last 168 hours. BNP: Invalid input(s): "POCBNP" CBG: Recent Labs  Lab 11/08/21 1733  GLUCAP 118*   D-Dimer No results  for input(s): "DDIMER" in the last 72 hours. Hgb A1c No results for input(s): "HGBA1C" in the last 72 hours. Lipid Profile No results for input(s): "CHOL", "HDL", "LDLCALC", "TRIG", "CHOLHDL", "LDLDIRECT" in the last 72 hours. Thyroid function studies No results for input(s): "TSH", "T4TOTAL", "T3FREE", "THYROIDAB" in the last 72 hours.  Invalid input(s): "FREET3" Anemia work up No results for input(s): "VITAMINB12", "FOLATE", "FERRITIN", "TIBC", "IRON", "RETICCTPCT" in the last 72 hours. Urinalysis    Component Value Date/Time   COLORURINE YELLOW 11/08/2021 0029   APPEARANCEUR HAZY (A) 11/08/2021 0029   LABSPEC 1.012 11/08/2021 0029   PHURINE 7.0 11/08/2021 0029   GLUCOSEU NEGATIVE 11/08/2021 0029   HGBUR NEGATIVE 11/08/2021 0029   BILIRUBINUR NEGATIVE 11/08/2021 0029   KETONESUR NEGATIVE 11/08/2021 0029   PROTEINUR NEGATIVE 11/08/2021 0029   UROBILINOGEN 0.2 08/01/2010 0924   NITRITE NEGATIVE 11/08/2021 0029   LEUKOCYTESUR MODERATE (A) 11/08/2021 0029   Sepsis Labs Recent Labs  Lab 11/08/21 1716 11/09/21 0600 11/10/21 0340 11/11/21 0348  WBC 5.7 5.4 6.6 4.3   Microbiology Recent Results (from the past 240 hour(s))  Blood Culture (routine x 2)     Status: None (Preliminary result)   Collection Time: 11/08/21 12:22 AM   Specimen: BLOOD  Result Value Ref Range Status   Specimen Description BLOOD SITE NOT SPECIFIED  Final   Special Requests   Final    BOTTLES DRAWN AEROBIC AND ANAEROBIC Blood Culture results may not be optimal due to an inadequate volume of blood received in culture bottles   Culture   Final    NO GROWTH 4 DAYS Performed at [facility], [place] [address], [place], [place] [address]    Report Status PENDING  Incomplete  Urine Culture     Status: Abnormal   Collection Time: 11/08/21 12:29 AM   Specimen: In/Out Cath Urine  Result Value Ref Range Status   Specimen Description IN/OUT CATH URINE  Final   Special Requests   Final    NONE Performed  at [facility], [place] [address] [place], [place] [address]    Culture 50,000 COLONIES/mL ESCHERICHIA COLI (A)  Final   Report Status 11/11/2021 FINAL  Final   Organism ID, Bacteria ESCHERICHIA COLI (A)  Final      Susceptibility   Escherichia coli - MIC*    AMPICILLIN <=2 SENSITIVE Sensitive     CEFAZOLIN <=4 SENSITIVE Sensitive     CEFEPIME <=0.12 SENSITIVE Sensitive     CEFTRIAXONE <=0.25 SENSITIVE Sensitive     CIPROFLOXACIN <=0.25 SENSITIVE Sensitive     GENTAMICIN <=1 SENSITIVE Sensitive     IMIPENEM <=0.25 SENSITIVE Sensitive     NITROFURANTOIN <=16 SENSITIVE Sensitive     TRIMETH/SULFA <=20 SENSITIVE Sensitive     AMPICILLIN/SULBACTAM <=2 SENSITIVE Sensitive  PIP/TAZO <=4 SENSITIVE Sensitive     * 50,000 COLONIES/mL ESCHERICHIA COLI  Blood Culture (routine x 2)     Status: None (Preliminary result)   Collection Time: 11/08/21 12:37 AM   Specimen: BLOOD LEFT HAND  Result Value Ref Range Status   Specimen Description BLOOD LEFT HAND  Final   Special Requests   Final    BOTTLES DRAWN AEROBIC AND ANAEROBIC Blood Culture adequate volume   Culture   Final    NO GROWTH 4 DAYS Performed at [facility], [place] [address] [place], [place] [address]    Report Status PENDING  Incomplete  SARS Coronavirus 2 by RT PCR (hospital order, performed in [facility] hospital lab) *cepheid single result test* Anterior Nasal Swab     Status: Abnormal   Collection Time: 11/11/21 10:31 AM   Specimen: Anterior Nasal Swab  Result Value Ref Range Status   SARS Coronavirus 2 by RT PCR POSITIVE (A) NEGATIVE Final    Comment: (NOTE) SARS-CoV-2 target nucleic acids are DETECTED  SARS-CoV-2 RNA is generally detectable in upper respiratory specimens  during the acute phase of infection.  Positive results are indicative  of the presence of the identified virus, but do not rule out bacterial infection or co-infection with other pathogens not detected by the test.  Clinical correlation  with patient history and  other diagnostic information is necessary to determine patient infection status.  The expected result is negative.  Fact Sheet for Patients:   https://www.patel.info/   Fact Sheet for Healthcare Providers:   https://hall.com/    This test is not yet approved or cleared by the [place] FDA and  has been authorized for detection and/or diagnosis of SARS-CoV-2 by FDA under an Emergency Use Authorization (EUA).  This EUA will remain in effect (meaning this test can be used) for the duration of  the COVID-19 declaration under Section 564(b)(1)  of the Act, 21 U.S.C. section 360-bbb-3(b)(1), unless the authorization is terminated or revoked sooner.   Performed at [facility], [place] [address], [place], [place] [address]      Time coordinating discharge: Over 30 minutes  SIGNED:   Darliss Cheney, MD  [facility] 11/12/2021, 10:37 AM *Please note that this is a verbal dictation therefore any spelling or grammatical errors are due to the "[place] [employer]" system interpretation. If 7PM-7AM, please contact night-coverage [email or web address]

## 2021-11-12 NOTE — Progress Notes (Signed)
ANTICOAGULATION CONSULT NOTE  Pharmacy Consult for warfarin Indication: atrial fibrillation  Allergies  Allergen Reactions   Sulfa Antibiotics Other (See Comments)    Granulocytosis   Zestril [Lisinopril] Cough    Patient Measurements: Height: 6' (182.9 cm) Weight: 77.1 kg (169 lb 15.6 oz) IBW/kg (Calculated) : 77.6  Vital Signs: Temp: 99.1 F (37.3 C) (11/05 0505) Temp Source: Oral (11/05 0505) BP: 139/82 (11/05 0505) Pulse Rate: 67 (11/05 0505)  Labs: Recent Labs    11/10/21 0340 11/11/21 0348 11/12/21 0330  HGB 10.7* 10.8*  --   HCT 33.4* 33.7*  --   PLT 140* 141*  --   LABPROT 23.4* 24.7* 27.1*  INR 2.1* 2.3* 2.5*  CREATININE 0.65 0.77  --      Estimated Creatinine Clearance: 75 mL/min (by C-G formula based on SCr of 0.77 mg/dL).  Medications:  Warfarin 7.'5mg'$  on Mondays and '5mg'$  all other days LD 11/1 @ 0900 per med rec Per coumadin clinic: 10/17/2021: 5 mg qday x 7.5 on Monday & Friday  Assessment: 86 yo male presents with a chief complaint of worsening generalized weakness. Past medical history otherwise significant for hypertension, aortic insufficiency, CAD, borderline diabetes, history of urinary retention, heart murmur, previous stroke, persistent A-fib.  Pharmacy consulted to dose warfarin. Admission INR 2.1  11/12/2021 INR 2.5, therapeutic CBC: Hg low/stable at 10.8, Plt low/stable at 141 No bleeding reported, PO diet, no major drug-drug interactions.  Goal of Therapy:  INR 2-3   Plan:  Warfarin 5 mg po today at 16:00 - Will not order dose today for inpatient administration as patient is supposed to be discharged prior to 16:00.  RN is aware and has educated patient and son that he should take his dose today at home at 16:00.   - If patient is not discharged, pharmacy will order dose for inpatient administration. Daily INR   Gretta Arab PharmD, BCPS [facility] main pharmacy [phone] 11/12/2021 1:50 PM

## 2021-11-12 NOTE — Plan of Care (Signed)
?  Problem: Coping: ?Goal: Level of anxiety will decrease ?Outcome: Progressing ?  ?Problem: Safety: ?Goal: Ability to remain free from injury will improve ?Outcome: Progressing ?  ?

## 2021-11-12 NOTE — Plan of Care (Signed)
  Problem: Education: Goal: Knowledge of General Education information will improve Description Including pain rating scale, medication(s)/side effects and non-pharmacologic comfort measures Outcome: Progressing   Problem: Activity: Goal: Risk for activity intolerance will decrease Outcome: Progressing   Problem: Safety: Goal: Ability to remain free from injury will improve Outcome: Progressing   

## 2021-11-12 NOTE — TOC Transition Note (Signed)
Transition of Care [facility]) - CM/SW Discharge Note   Patient Details  Name: [name]. MRN: [id number] Date of Birth: [birth year]  Transition of [facility] [place] CM/SW Contact:  Dessa Phi, RN Phone Number: 11/12/2021, 11:46 AM   Clinical Narrative: Spoke to patient about d/c home w/HHC-he declines SNF. [facility] rep Malachy Mood can provide HHPT/OT-start of care on 11/9 Thursday-patient in agreement. No further CM needs.      Final next level of care: [place] Barriers to Discharge: No Barriers Identified   Patient Goals and CMS Choice        Discharge Placement                       Discharge Plan and Services In-house Referral: Clinical Social Work                        HH Arranged: PT, OT [place] Agency: [place] Date [place]: 11/12/21 Time [place]: [phone] Representative spoke with at [place]: [place] Interventions     Readmission Risk Interventions    11/10/2021    3:54 PM  Readmission Risk Prevention Plan  Transportation Screening Complete  PCP or Specialist Appt within 3-5 Days Complete  HRI or [place] Complete  Social Work Consult for [place] Planning/Counseling Complete  Palliative Care Screening Not Applicable  Medication Review [employer]) Complete

## 2021-11-12 NOTE — Progress Notes (Signed)
Physical Therapy Treatment Patient Details Name: [name]. MRN: [id number] DOB: October 06, 1935 [name] Date: 11/12/2021   History of Present Illness Pt is an 86yo male PMH: s/p L-TKA on 09/19/21,CAD s/p cath, gout, HTN, peripheral neuropathy, PAF s/p Maze, s/p aortic valve replacement, s/p tricuspid valve repair, hx of stroke, L-THA 2022 presented to ED 11/08/21 with weakness, febrile, had a fall. Pt was found to have a UTI, he was febrile and was admitted to the hospital.  An MRI of the brain was negative for acute strokes. Wife later tested positive for Covid and so has the patient, so that appears to explain his symptoms, not UTI. Antibiotics stopped    PT Comments    Pt overall supervision for mobility and ambulated 280 feet with RW in hallway.  Pt's current plan is to d/c home and receive HHPT.  Pt encouraged to continue using RW at home for safety, which he is agreeable.   Recommendations for follow up therapy are one component of a multi-disciplinary discharge planning process, led by the attending physician.  Recommendations may be updated based on patient status, additional functional criteria and insurance authorization.  Follow Up Recommendations  Home health PT     Assistance Recommended at Discharge Set up Supervision/Assistance  Patient can return home with the following Assistance with cooking/housework;Assist for transportation;Help with stairs or ramp for entrance   Equipment Recommendations  None recommended by PT    Recommendations for Other Services       Precautions / Restrictions Precautions Precautions: Fall Precaution Comments: LTKA 09/19/21     Mobility  Bed Mobility Overal bed mobility: Modified Independent                  Transfers Overall transfer level: Needs assistance Equipment used: Rolling walker (2 wheels) Transfers: Sit to/from Stand Sit to Stand: Supervision           General transfer comment: utilized UE for self  assist    Ambulation/Gait Ambulation/Gait assistance: Supervision, Min guard Gait Distance (Feet): 280 Feet Assistive device: Rolling walker (2 wheels) Gait Pattern/deviations: Step-through pattern, Decreased stride length Gait velocity: decreased     General Gait Details: no unsteadiness or LOB observed, pt reports some weakness and fatigue from not being as mobile during hospitalization, agreeable to use RW at home for safety and endurance   Stairs             Wheelchair Mobility    Modified Rankin (Stroke Patients Only)       Balance                                            Cognition Arousal/Alertness: Awake/alert Behavior During Therapy: WFL for tasks assessed/performed Overall Cognitive Status: Within Functional Limits for tasks assessed                                          Exercises      General Comments        Pertinent Vitals/Pain Pain Assessment Pain Assessment: No/denies pain    Home Living                          Prior Function  PT Goals (current goals can now be found in the care plan section) Progress towards PT goals: Progressing toward goals    Frequency    Min 3X/week      PT Plan Discharge plan needs to be updated    Co-evaluation              AM-PAC PT "6 Clicks" Mobility   Outcome Measure  Help needed turning from your back to your side while in a flat bed without using bedrails?: None Help needed moving from lying on your back to sitting on the side of a flat bed without using bedrails?: A Little Help needed moving to and from a bed to a chair (including a wheelchair)?: A Little Help needed standing up from a chair using your arms (e.g., wheelchair or bedside chair)?: A Little Help needed to walk in hospital room?: A Little Help needed climbing 3-5 steps with a railing? : A Little 6 Click Score: 19    End of Session Equipment Utilized During  Treatment: Gait belt Activity Tolerance: Patient tolerated treatment well Patient left: in chair;with call bell/phone within reach;with chair alarm set Nurse Communication: Mobility status PT Visit Diagnosis: Difficulty in walking, not elsewhere classified (R26.2)     Time: 1210-1222 PT Time Calculation (min) (ACUTE ONLY): 12 min  Charges:  $Gait Training: 8-22 mins                    Arlyce Dice, DPT Physical Therapist Acute Rehabilitation Services Preferred contact method: Secure Chat Weekend Pager Only: [phone] Office: [phone]    Myrtis Hopping [place] 11/12/2021, 1:49 PM

## 2021-11-12 NOTE — Telephone Encounter (Signed)
Post ED Visit - Positive Culture Follow-up  Culture report reviewed by antimicrobial stewardship pharmacist: [place] Team '[]'$  Elenor Quinones, Pharm.D. '[]'$  Heide Guile, Pharm.D., BCPS AQ-ID '[]'$  Parks Neptune, Pharm.D., BCPS '[]'$  Alycia Rossetti, Pharm.D., BCPS '[]'$  [place], Pharm.D., BCPS, AAHIVP '[]'$  Legrand Como, Pharm.D., BCPS, AAHIVP '[]'$  Salome Arnt, PharmD, BCPS '[]'$  Johnnette Gourd, PharmD, BCPS '[]'$  Hughes Better, PharmD, BCPS '[]'$  Leeroy Cha, PharmD '[]'$  Laqueta Linden, PharmD, BCPS '[x]'$  Franchot Gallo, PharmD  [place] Team '[]'$  Leodis Sias, PharmD '[]'$  Lindell Spar, PharmD '[]'$  Royetta Asal, PharmD '[]'$  Graylin Shiver, Rph '[]'$  Rema Fendt) Glennon Mac, PharmD '[]'$  Arlyn Dunning, PharmD '[]'$  Netta Cedars, PharmD '[]'$  Dia Sitter, PharmD '[]'$  Leone Haven, PharmD '[]'$  Gretta Arab, PharmD '[]'$  Theodis Shove, PharmD '[]'$  Peggyann Juba, PharmD '[]'$  Reuel Boom, PharmD   Positive urine culture Treated with Cephalexin, organism sensitive to the same and no further patient follow-up is required at this time.  Rosie Fate 11/12/2021, 2:37 PM

## 2021-11-13 LAB — CULTURE, BLOOD (ROUTINE X 2)
Culture: NO GROWTH
Culture: NO GROWTH
Special Requests: ADEQUATE

## 2021-11-15 DIAGNOSIS — R338 Other retention of urine: Secondary | ICD-10-CM | POA: Diagnosis not present

## 2021-11-16 DIAGNOSIS — I10 Essential (primary) hypertension: Secondary | ICD-10-CM | POA: Diagnosis not present

## 2021-11-16 DIAGNOSIS — D649 Anemia, unspecified: Secondary | ICD-10-CM | POA: Diagnosis not present

## 2021-11-16 DIAGNOSIS — I4891 Unspecified atrial fibrillation: Secondary | ICD-10-CM | POA: Diagnosis not present

## 2021-11-20 DIAGNOSIS — D649 Anemia, unspecified: Secondary | ICD-10-CM | POA: Diagnosis not present

## 2021-11-20 DIAGNOSIS — R339 Retention of urine, unspecified: Secondary | ICD-10-CM | POA: Diagnosis not present

## 2021-11-23 ENCOUNTER — Ambulatory Visit: Payer: Medicare Other | Attending: Cardiology

## 2021-11-23 DIAGNOSIS — Z9889 Other specified postprocedural states: Secondary | ICD-10-CM

## 2021-11-23 DIAGNOSIS — Z7901 Long term (current) use of anticoagulants: Secondary | ICD-10-CM

## 2021-11-23 DIAGNOSIS — I639 Cerebral infarction, unspecified: Secondary | ICD-10-CM

## 2021-11-23 DIAGNOSIS — I4891 Unspecified atrial fibrillation: Secondary | ICD-10-CM

## 2021-11-23 DIAGNOSIS — Z5181 Encounter for therapeutic drug level monitoring: Secondary | ICD-10-CM | POA: Diagnosis not present

## 2021-11-23 LAB — POCT INR: INR: 2.3 (ref 2.0–3.0)

## 2021-11-23 NOTE — Patient Instructions (Signed)
Description   Continue what you have been taking, 1 tablet daily except for 1.5 tablets on Mondays.  Stay consistent with greens each week.  Recheck INR in 4 weeks.  [facility] [phone]

## 2021-11-27 NOTE — Progress Notes (Unsigned)
[facility] [address] [place]. [place] [address] [phone]       STROKE FOLLOW UP NOTE  [name]. Date of Birth:  [birth year] Medical Record Number:  [id number]   Reason for Referral: stroke follow up    SUBJECTIVE:   CHIEF COMPLAINT:  No chief complaint on file.   HPI:   Update 11/28/2021 JM: Patient returns for 64-monthstroke follow-up.  He has been overall stable from stroke standpoint.     Has had a couple hospitalizations since prior visit most recently 11/1 after a fall and generalized weakness, found to have UTI and was positive for COVID which was felt to be contributing to symptoms.  MRI brain negative for acute findings.  Denies any new stroke/TIA symptoms.  Remains on warfarin and Crestor.  Blood pressure well controlled.  Routinely follows with cardiology and PCP.        History provided for reference purposes only Initial visit 07/18/2021 JM: [name] being seen for initial hospital follow-up accompanied by his wife.  He was previously seen in office 04/2017 and lost to follow-up after that time.  Overall he has been doing well since discharge.  He has concerns regarding his balance is not quite at baseline. Does still have some issues with left ear, has evaluation with ENT in a couple of months. Was seen by home health therapy only once and was cleared after that. He does mention longstanding history of neuropathy (+10 years) currently being treated with gabapentin 200 mg nightly, tolerates well but limited benefit. Symptoms typically worse at night. Neuropathy has been relatively stable over the past 2 years without progression.  Ambulates without assistive device.  Denies any stroke/TIA symptoms since discharge.  Compliant on warfarin and Crestor, denies side effects.  Blood pressure today 130/78.  Routinely followed by cardiology and Coumadin clinic for warfarin management and has since had follow-up with PCP.   No  further concerns at this time  Stroke admission 05/21/2021 [name] is a 86y.o. 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  Etiology found to be sepsis suspected source being tooth abscess.  MRI brain showed small left frontal DWI abnormality cortically with questionable ADC correlate.  CTA head/neck negative LVO, diffuse stenosis involving mid right P2 segment.  EF 60 to 65%, LVH and severely dilated left atrium.  Evaluated by neurologist Dr. LCheral Markerfor guidance for anticoagulation. Per consult note, the size and location of acute infarct would not explain presenting symptoms and does not preclude him from restarting anticoagulation for A-fib as he is at low risk for hemorrhagic conversion.  Warfarin restarted at 5 mg daily.  LDL 77 -recommend continuation of Crestor 10 mg daily.  A1c 5.7, recommend close follow-up with PCP for monitoring. Treated for SIRS and otitis externa by primary team and tooth pain with concern of abscess treated and advised dentist follow-up.  He was discharged home on 5/17 stable condition [facility]     PERTINENT IMAGING  CT-head:  Chronic right temporal lobe atrophy secondary to RMCA infarction. Moderate atrophy throughout   CTA head and neck  1. Negative CTA for large vessel occlusion or other emergent finding. 2. Moderate diffuse stenosis involving the mid right P2 segment. 3. Additional  mild for age atheromatous change elsewhere about the major arterial vascular of the head and neck. No other proximal high-grade or correctable stenosis. 4. Diffuse arterial vascular tortuosity, suggesting chronic underlying hypertension   MRI brain:  Acute, tiny, cortical left frontal lobe infarction; chronic right temporal  lobe infarction with surrounding encephalomalacia/cortical atrophy, which is present throughout parenchyma as well. Chronic left occipital infarction; left cerebellar and cebrum bilaterally with microhemorrhages    Recent TTE: 60-65 % EF, LVH, severely dilated left atrial       ROS:   14 system review of systems performed and negative with exception of those listed in HPI  PMH:  Past Medical History:  Diagnosis Date   Acquired dilation of ascending aorta and aortic root (HCC)    31m aortic root and 456mascending aorta on echo 03/2020   Arthritis    Bladder stones    Borderline diabetes    BPH (benign prostatic hyperplasia)    CAD (coronary artery disease) 12/19/2020   Non-obstructive coronary artery disease at cath in 2019   Coronary artery disease    cardiologist-  dr skMarlou PorchloCecille Rubinerhart NP--- per cath 06-02-2010 non-obstructive cad pLAD 30-40%   Diverticulosis of colon    Dysrhythmia    afib   Gout    Heart murmur    History of adenomatous polyp of colon    2002-- tubular adenoma   History of aortic insufficiency    severe -- s/p  AVR 08-03-2010   History of small bowel obstruction    02/ 2007 mechanical sbo s/p  surgical intervention;  partial sbo 09/ 2011 and 03-20-2011 resolved without surgical intervention   History of urinary retention    HTN (hypertension)    Peripheral neuropathy    Persistent atrial fibrillation (HCC)    S/P aortic valve replacement with prosthetic valve 08/03/2010   tissue valve   S/P Maze operation for atrial fibrillation 01/30/2018   Complete bilateral atrial lesion set using bipolar radiofrequency and cryothermy with clipping of LA appendage   S/P MVR (mitral valve repair) 01/30/2018   Complex valvuloplasty including artificial Gore-tex neochord placement x4 and Carbo medics Annuloflex ring annuloplasty, size 28   S/P patent foramen ovale closure 08/03/2010   at same time AVR   S/P tricuspid valve repair 01/30/2018   Using an MC3  Annuloplasty ring, size 28   Stroke (HCWatkins   Tricuspid regurgitation     PSH:  Past Surgical History:  Procedure Laterality Date   BIOPSY  09/07/2018   Procedure: BIOPSY;  Surgeon: BrOtis BraceMD;  [facility]: [facility];  Service: Gastroenterology;;   CARDIAC CATHETERIZATION  06-02-2010  dr skMarlou Porch non-obstructive cad- pLAD 30-40%/  normal LVSF/  severe AI   CARDIOVASCULAR STRESS TEST  04/12/2016   Low risk nuclear perfusion study w/ no significant reversible ischemia/  normal LV function and wall motion ,  stress ef 60%/  49m18mnferior and lateral scooped ST-segment depression w/ exercise (may be repolarization abnormality), exercise capacity was moderately reduced   CATARACT EXTRACTION W/ INTRAOCULAR LENS  IMPLANT, BILATERAL  02/2010   CLIPPING OF ATRIAL APPENDAGE N/A 01/30/2018   Procedure: CLIPPING OF LEFT ATRIAL APPENDAGE USING ATRICLIP PRO2 45MM;  Surgeon: OweRexene AlbertsD;  [facility];  Laterality: N/A;   COLONOSCOPY WITH PROPOFOL N/A 10/21/2018   Procedure: COLONOSCOPY WITH PROPOFOL;  Surgeon: BraOtis BraceD;  [facility];  Service: Gastroenterology;  Laterality: N/A;   CYSTOSCOPY WITH  LITHOLAPAXY N/A 06/05/2016   Procedure: CYSTOSCOPY WITH LITHOLAPAXY and fulgarization of bladder neck;  Surgeon: Irine Seal, MD;  Location: [facility];  Service: Urology;  Laterality: N/A;   ESOPHAGOGASTRODUODENOSCOPY (EGD) WITH PROPOFOL N/A 09/07/2018   Procedure: ESOPHAGOGASTRODUODENOSCOPY (EGD) WITH PROPOFOL;  Surgeon: Otis Brace, MD;  [facility];  Service: Gastroenterology;  Laterality: N/A;   EXPLORATORY LAPARTOMY /  CHOLECYSTECTOMY  02/28/2005   for Small  bowel obstruction (mechnical)   IR ANGIO EXTRACRAN SEL COM CAROTID INNOMINATE UNI L MOD SED  10/24/2016   IR ANGIO VERTEBRAL SEL SUBCLAVIAN INNOMINATE BILAT MOD SED  10/24/2016   IR PERCUTANEOUS ART THROMBECTOMY/INFUSION INTRACRANIAL INC DIAG  ANGIO  10/24/2016   IR RADIOLOGIST EVAL & MGMT  12/05/2016   LEFT KNEE ARTHROSCOPY  2006   MAZE N/A 01/30/2018   Procedure: MAZE;  Surgeon: Rexene Alberts, MD;  Location: [place];  Service: Open Heart Surgery;  Laterality: N/A;   MITRAL VALVE REPAIR N/A 01/30/2018   Procedure: MITRAL VALVE REPAIR (MVR) USING CARBOMEDICS ANNULOFLEX SIZE 28;  Surgeon: Rexene Alberts, MD;  Location: [place];  Service: Open Heart Surgery;  Laterality: N/A;   POLYPECTOMY  10/21/2018   Procedure: POLYPECTOMY;  Surgeon: Otis Brace, MD;  [facility];  Service: Gastroenterology;;   RADIOLOGY WITH ANESTHESIA N/A 10/24/2016   Procedure: RADIOLOGY WITH ANESTHESIA;  Surgeon: Luanne Bras, MD;  Location: [place];  Service: Radiology;  Laterality: N/A;   RIGHT FOOT SURGERY     RIGHT MINIATURE ANTERIOR THORACOTOMY FOR AORTIC VALVE REPLACEMENT AND CLOSURE PATENT FORAMEN OVALE  08-03-2010  DR Levada Schilling Magna-ease pericardial tissue valve (6m)   RIGHT/LEFT HEART CATH AND CORONARY ANGIOGRAPHY N/A 10/14/2017   Procedure: RIGHT/LEFT HEART CATH AND CORONARY ANGIOGRAPHY;  Surgeon: CSherren Mocha MD;  [facility];  Service: Cardiovascular;  Laterality: N/A;   TEE WITHOUT CARDIOVERSION N/A 10/14/2017   Procedure: TRANSESOPHAGEAL ECHOCARDIOGRAM (TEE);  Surgeon: NJosue Hector MD;  [facility];  Service: Cardiovascular;  Laterality: N/A;   TOTAL HIP ARTHROPLASTY Left 08/02/2020   Procedure: TOTAL HIP ARTHROPLASTY ANTERIOR APPROACH;  Surgeon: OParalee Cancel MD;  [facility];  Service: Orthopedics;  Laterality: Left;   TOTAL KNEE ARTHROPLASTY Left 09/19/2021   Procedure: TOTAL KNEE ARTHROPLASTY;  Surgeon: OParalee Cancel MD;  [facility];  Service: Orthopedics;  Laterality: Left;   TRANSTHORACIC ECHOCARDIOGRAM  05/30/2016  dr skains   moderate  LVH ef 60-65%/  bioprothesis aortic valve present ,normal grandient and no AI /  mild MV calcification , moderate MR /  mild PR/  moderate TR/  PASP 314mg/ (RA denisty was identified 04-27-2016 echo) and is seen again today, this is likely a promient eustacian ridge, atrium is normal size   TRICUSPID VALVE REPLACEMENT N/A 01/30/2018   Procedure: TRICUSPID VALVE REPAIR USING MC3 SIZE 28;  Surgeon: OwRexene AlbertsMD;  [facility]: [facility];  Laterality: N/A;    Social History:  Social History   Socioeconomic History   Marital status: Married    Spouse name: Roberta   Number of children: 2   Years of education: Not on file   Highest education level: Not on file  Occupational History   Occupation: RETIRED  Tobacco Use   Smoking status: Never   Smokeless tobacco: Never  Vaping Use   Vaping Use: Never used  Substance and Sexual Activity   Alcohol use: Yes    Comment: ONE OR TWO PER MONTH  Drug use: No   Sexual activity: Not on file  Other Topics Concern   Not on file  Social History Narrative   Not on file   Social Determinants of Health   Financial Resource Strain: Low Risk  (06/16/2021)   Overall Financial Resource Strain (CARDIA)    Difficulty of Paying Living Expenses: Not hard at all  Food Insecurity: No Food Insecurity (11/09/2021)   Hunger Vital Sign    Worried About Running Out of Food in the Last Year: Never true    Ran Out of Food in the Last Year: Never true  Transportation Needs: No Transportation Needs (11/09/2021)   PRAPARE - Hydrologist (Medical): No    Lack of Transportation (Non-Medical): No  Physical Activity: Not on file  Stress: No Stress Concern Present (06/16/2021)   [place]    Feeling of Stress : Only a little  Social Connections: Unknown (06/02/2021)   Social Connection and Isolation Panel [NHANES]    Frequency of Communication with Friends and Family: Twice a week    Frequency of Social Gatherings with Friends and Family: Twice a week    Attends Religious  Services: Patient refused    Marine scientist or Organizations: Patient refused    Attends Archivist Meetings: Patient refused    Marital Status: Married  Human resources officer Violence: Not At Risk (11/09/2021)   Humiliation, Afraid, Rape, and Kick questionnaire    Fear of Current or Ex-Partner: No    Emotionally Abused: No    Physically Abused: No    Sexually Abused: No    Family History:  Family History  Problem Relation Age of Onset   Heart disease Mother    Brain cancer Father     Medications:   Current Outpatient Medications on File Prior to Visit  Medication Sig Dispense Refill   acetaminophen (TYLENOL) 500 MG tablet Take 2 tablets (1,000 mg total) by mouth every 6 (six) hours. 30 tablet 0   allopurinol (ZYLOPRIM) 300 MG tablet Take 150 mg by mouth every morning.     amLODipine (NORVASC) 10 MG tablet TAKE 1 TABLET BY MOUTH DAILY (Patient taking differently: Take 10 mg by mouth daily.) 90 tablet 3   atenolol (TENORMIN) 25 MG tablet Take 0.5 tablets (12.5 mg total) by mouth daily.     cholecalciferol (VITAMIN D3) 25 MCG (1000 UNIT) tablet Take 1,000 Units by mouth daily.     Cinnamon 500 MG capsule Take 500 mg by mouth every morning.     feeding supplement (ENSURE ENLIVE / ENSURE PLUS) LIQD Take 237 mLs by mouth 2 (two) times daily between meals. (Patient taking differently: Take 237 mLs by mouth once a week.) 237 mL 12   ferrous sulfate 325 (65 FE) MG EC tablet Take 325 mg by mouth 2 (two) times daily.     finasteride (PROSCAR) 5 MG tablet Take 1 tablet (5 mg total) by mouth daily. 30 tablet 0   gabapentin (NEURONTIN) 100 MG capsule Take 100-300 mg by mouth See admin instructions. Take 1 capsule (100 mg) by mouth in the morning and take 3 capsules (300 mg) at bedtime     Multiple Vitamins-Minerals (CENTRUM SILVER ADULT 50+) TABS Take 1 tablet by mouth daily.     rosuvastatin (CRESTOR) 10 MG tablet Take 1 tablet (10 mg total) by mouth daily. 90 tablet 3   silodosin  (RAPAFLO) 8 MG CAPS capsule Take 8 mg  by mouth daily.     tamsulosin (FLOMAX) 0.4 MG CAPS capsule Take 2 capsules (0.8 mg total) by mouth daily after breakfast. (Patient not taking: Reported on 11/08/2021) 30 capsule 0   traZODone (DESYREL) 50 MG tablet Take 50 mg by mouth at bedtime as needed for sleep.     warfarin (COUMADIN) 5 MG tablet Take 1 tablet (5 mg total) by mouth daily at 4 PM. Until seen by warfarin clinic. (Patient taking differently: Take 5-7.5 mg by mouth See admin instructions. Take 7.5 mg on Mondays Take 5 mg on all other days) 135 tablet 3   No current facility-administered medications on file prior to visit.    Allergies:   Allergies  Allergen Reactions   Sulfa Antibiotics Other (See Comments)    Granulocytosis   Zestril [Lisinopril] Cough      OBJECTIVE:  Physical Exam  There were no vitals filed for this visit.  There is no height or weight on file to calculate BMI. No results found.   General: well developed, well nourished, very pleasant elderly Caucasian male, seated, in no evident distress Head: head normocephalic and atraumatic.   Neck: supple with no carotid or supraclavicular bruits Cardiovascular: regular rate and rhythm, no murmurs Musculoskeletal: no deformity Skin:  no rash/petichiae Vascular:  Normal pulses all extremities   Neurologic Exam Mental Status: Awake and fully alert.  Fluent speech and language.  Oriented to place and time. Recent and remote memory intact. Attention span, concentration and fund of knowledge appropriate. Mood and affect appropriate.  Cranial Nerves: Pupils equal, briskly reactive to light. Extraocular movements full without nystagmus. Visual fields full to confrontation. Hearing intact. Facial sensation intact. Face, tongue, palate moves normally and symmetrically.  Motor: Normal bulk and tone. Normal strength in all tested extremity muscles Sensory.:  Decreased light touch, vibratory and pinprick sensation bilateral  lower extremities from ankle down Coordination: Rapid alternating movements normal in all extremities. Finger-to-nose and heel-to-shin performed accurately bilaterally. Gait and Station: Arises from chair without difficulty. Stance is normal. Gait demonstrates initially mild imbalance/unsteadiness but improved after a few steps. No AD.  Difficulty performing tandem walk and heel toe unsupported.   Reflexes: 1+ and symmetric. Toes downgoing.         ASSESSMENT: [name]. is a 86 y.o. year old male with likely incidental finding of small left frontal lobe infarct on 05/21/2021 after presenting with generalized weakness likely in setting of sepsis with otitis externa and dental abscess.  Vascular risk factors include R MCA stroke, HTN, HLD, A-fib s/p ablation on warfarin, CAD, s/p aortic valve repair 07/2019 2/2 aortic insufficiency, CHF, mitral valve regurgitation and prediabetes.      PLAN:  Left frontal lobe infarct:  Continue warfarin daily  and Crestor 10 mg daily for secondary stroke prevention.   Discussed secondary stroke prevention measures and importance of close PCP follow up for aggressive stroke risk factor management including BP goal<130/90, HLD with LDL goal<70 and pre-DM with A1c.<7.  Stroke labs 05/2021: A1c 5.7, LDL 77 I have gone over the pathophysiology of stroke, warning signs and symptoms, risk factors and their management in some detail with instructions to go to the closest emergency room for symptoms of concern. Peripheral neuropathy:  >10 yr duration, stable over the past 2 years without progression.  Does have pre-DM possibly contributing.  Prior B12 level and thyroid function WNL.   Recommend increasing gabapentin dosage to 300 mg twice daily.  Did discuss potential side effects and possible  need of dosage increase in the future.  Discussed participation with normal PT as imbalance likely coming from neuropathy.  He wishes to hold off at this time but  will call if interested in pursuing in the future Will hold off on further neuropathy work-up as symptoms have been stable but if symptoms should worsen down the road, may benefit from completing EMG/NCV. He verbalized understanding and agrees with plan    Follow up in 4 months or call earlier if needed   CC: PCP: Marda Stalker, PA-C    I spent 59 minutes of face-to-face and non-face-to-face time with patient and wife.  This included previsit chart review, lab review, study review, order entry, electronic health record documentation, patient and wife education regarding prior stroke including etiology, secondary stroke prevention measures and importance of managing stroke risk factors, chronic neuropathy, further treatment options approximated further evaluation in the future and answered all other questions to patient and wife's satisfaction   Frann Rider, AGNP-BC  [facility] [address] [place], [place] [address]  Phone [phone] Fax [phone] Note: This document was prepared with digital dictation and possible smart phrase technology. Any transcriptional errors that result from this process are unintentional.

## 2021-11-28 ENCOUNTER — Encounter: Payer: Self-pay | Admitting: Adult Health

## 2021-11-28 ENCOUNTER — Ambulatory Visit: Payer: Medicare Other | Admitting: Adult Health

## 2021-11-28 VITALS — BP 130/74 | HR 83 | Ht 72.0 in | Wt 166.0 lb

## 2021-11-28 DIAGNOSIS — G609 Hereditary and idiopathic neuropathy, unspecified: Secondary | ICD-10-CM | POA: Diagnosis not present

## 2021-11-28 DIAGNOSIS — I639 Cerebral infarction, unspecified: Secondary | ICD-10-CM

## 2021-11-28 MED ORDER — GABAPENTIN 300 MG PO CAPS: 300.0000 mg | ORAL_CAPSULE | Freq: Two times a day (BID) | ORAL | 3 refills | Status: DC

## 2021-11-28 NOTE — Progress Notes (Unsigned)
Cardiology Office Note:    Date:  11/29/2021   ID:  Gerald Amas., DOB 1935/09/14, MRN [id number]  PCP:  Marda Stalker, [place] Providers Cardiologist:  Candee Furbish, MD Cardiology APP:  Sharmon [place]vere    Referring MD: Marda Stalker, PA-C   Chief Complaint:  F/u for valvular heart disease, afib    Patient Profile: Aortic insufficiency S/p bioprosthetic AVR and Patent Foramen Ovale closure in 2012 Echo 3/23: EF 60-65, no RWMA, mild LVH, NL RVSF, mod elevated PASP, RVSP 50.8, severe LAE, MV repair w mean gradient 9 mmHg, trivial MR, normally functioning tricuspid valve repair, normally functioning AVR, mild dilation of ascending aorta (36 mm), mild dilation of aortic root (41 mm)  Severe MR, severe TR S/p MV Repair, TV Repair, MAZE, LA clipping in 01/2018 Hypertension Atenolol DCd in 3/22 due to ? HR but resumed due to uncontrolled BP Permanent atrial fibrillation/flutter Chronic anticoagulation w Warfarin Monitor 04/14/20: 151 SV runs; Mobitz 1, PACs 6.4%, rare PVCs, poss AF/Flutter Hx of R MCA CVA tx with cath based Rx Acute/subacute L frontal CVA 05/2021 Coronary artery disease Non-obstructive by cath in 10/19 (pLCx 30, os LAD 30, pLAD 40) Carotid artery stenosis [place] 01/20/18: L 1-39 Aortic atherosclerosis  Dilated thoracic aorta Echocardiogram 3/22:  root 43 mm, ascending aorta 41 mm Hx of GI bleed Borderline diabetes mellitus BPH Peripheral neuropathy L hip fracture due to mechanical fall 7/22 >> s/p L THR Low albumin S/p L TKR 09/2021   Cardiac Studies & Procedures   CARDIAC CATHETERIZATION  CARDIAC CATHETERIZATION 10/14/2017  Narrative  Prox Cx to Mid Cx lesion is 30% stenosed.  Ost LAD to Prox LAD lesion is 30% stenosed.  Prox LAD to Mid LAD lesion is 40% stenosed.  1.  Nonobstructive coronary artery disease with no significant change compared to 2012 cardiac catheterization study 2.  Large V waves in the pulmonary  capillary wedge tracing consistent with severe mitral insufficiency  Recommend: referral to cardiac surgery for further evaluation of treatment options (surgical mitral valve repair + Maze versus percutaneous mitral valve repair)  Resume apixaban tomorrow morning  Findings Coronary Findings Diagnostic  Dominance: Right  Left Main There is mild diffuse disease throughout the vessel.  Left Anterior Descending Ost LAD to Prox LAD lesion is 30% stenosed. Prox LAD to Mid LAD lesion is 40% stenosed. The LAD is patent throughout.  There is mild diffuse stenosis of the proximal vessel without significant obstruction.  There is mild stenosis of the mid vessel without high-grade obstruction.  Left Circumflex Prox Cx to Mid Cx lesion is 30% stenosed.  Right Coronary Artery Vessel is large. There is mild diffuse disease throughout the vessel. The vessel is ectatic. The RCA is a large, dominant vessel.  The vessel has mild diffuse irregularity with no significant stenosis.  There is mild ectasia through the midportion of the RCA.  The PDA is a large branch with no significant stenosis.  Intervention  No interventions have been documented.   STRESS TESTS  MYOCARDIAL PERFUSION IMAGING 04/12/2016  Narrative  Nuclear stress EF: 60%.  Blood pressure demonstrated a normal response to exercise.  Horizontal ST segment depression ST segment depression of 2 mm was noted during stress in the II, III, aVF, V6, V5 and V4 leads.  This is a low risk study.  ST segment depression of 2 mm was noted during stress in the II, III, aVF, V6, V5 and V4 leads.  The test was stopped  because the patient complained of fatigue and shortness of breath  Overall, the patient's exercise capacity was moderately impaired  [facility] Treadmill Score: intermediate risk  No significant reversible ischemia. LVEF 60% with normal wall motion. 2 mm inferior and lateral scooped ST-segment depression with exercise, may be  repolarization abnormality. Exercise capacity was moderately reduced. Overall, however, this is a low risk study.   ECHOCARDIOGRAM  ECHOCARDIOGRAM COMPLETE 03/22/2021  Narrative ECHOCARDIOGRAM REPORT    Patient Name:   [name]. Date of Exam: 03/22/2021 Medical Rec #:  [id number]             Height:       72.0 in Accession #:    [id number]            Weight:       181.6 lb Date of Birth:  1935/06/10             BSA:          2.045 m Patient Age:    86 years              BP:           134/74 mmHg Patient Gender: M                     HR:           56 bpm. Exam Location:  [place]  Procedure: 2D Echo, 3D Echo, Cardiac Doppler and Color Doppler  Indications:    Aortic Valve Disorder I35.9; Dilated Aortic Root on echo in 2022.  History:        Patient has prior history of Echocardiogram examinations, most recent 03/16/2020. CAD, Tricuspid Valve Repair 01/31/2020, 60m Edwards mc3 ring annuloplasty., Arrythmias:Atrial Fibrillation; Risk Factors:Hypertension. Septal Repair:PFO Closure 08/03/2010. Aortic Valve: [facility] mm [facility] valve is present in the aortic position. Procedure Date: 08/03/2010. Mitral Valve: 28 mm Sorin [employer] Annuloflex ring prosthetic annuloplasty ring valve is present in the mitral position. Procedure Date: 01/30/2018.  Sonographer:    CMikki SanteeRDCS Referring Phys: [id number] SBlair DolphinWEAVER  IMPRESSIONS   1. Left ventricular ejection fraction, by estimation, is 60 to 65%. Left ventricular ejection fraction by PLAX is 78 %. The left ventricle has normal function. The left ventricle has no regional wall motion abnormalities. There is mild concentric left ventricular hypertrophy. Left ventricular diastolic parameters are indeterminate. 2. Right ventricular systolic function is normal. The right ventricular size is normal. There is moderately elevated pulmonary artery systolic pressure. 3. Left atrial size was severely dilated. 4. Compared  with the echo 03/2020, mean gradient across the repaired mitral valve has increased from 6 mmHg to 9 mmHg. The mitral valve has been repaired/replaced. Trivial mitral valve regurgitation. No evidence of mitral stenosis. There is a 28 mm [employer]in [employer] Annuloflex ring prosthetic annuloplasty ring present in the mitral position. Procedure Date: 01/30/2018. 5. Tricuspid valve annuloplasty ring. The tricuspid valve is has been repaired/replaced. 6. Unchanged from echo 03/2020. The aortic valve is normal in structure. Aortic valve regurgitation is not visualized. No aortic stenosis is present. There is a 25 mm [facility] valve present in the aortic position. Procedure Date: 08/03/2010. Echo findings are consistent with normal structure and function of the aortic valve prosthesis. 7. Aortic dilatation noted. There is mild dilatation of the aortic root, measuring 41 mm. There is mild dilatation of the ascending aorta, measuring 36 mm. 8. The inferior vena cava is normal in size with  greater than 50% respiratory variability, suggesting right atrial pressure of 3 mmHg.  FINDINGS Left Ventricle: Left ventricular ejection fraction, by estimation, is 60 to 65%. Left ventricular ejection fraction by PLAX is 78 %. The left ventricle has normal function. The left ventricle has no regional wall motion abnormalities. The left ventricular internal cavity size was normal in size. There is mild concentric left ventricular hypertrophy. Left ventricular diastolic function could not be evaluated due to mitral valve repair. Left ventricular diastolic parameters are indeterminate.  Right Ventricle: The right ventricular size is normal. No increase in right ventricular wall thickness. Right ventricular systolic function is normal. There is moderately elevated pulmonary artery systolic pressure. The tricuspid regurgitant velocity is 3.27 m/s, and with an assumed right atrial pressure of 8 mmHg, the estimated right  ventricular systolic pressure is 30.8 mmHg.  Left Atrium: Left atrial size was severely dilated.  Right Atrium: Right atrial size was normal in size.  Pericardium: There is no evidence of pericardial effusion.  Mitral Valve: Compared with the echo 03/2020, mean gradient across the repaired mitral valve has increased from 6 mmHg to 9 mmHg. The mitral valve has been repaired/replaced. Trivial mitral valve regurgitation. There is a 28 mm Sorin [employer] Annuloflex ring prosthetic annuloplasty ring present in the mitral position. Procedure Date: 01/30/2018. No evidence of mitral valve stenosis. MV peak gradient, 23.6 mmHg. The mean mitral valve gradient is 9.0 mmHg.  Tricuspid Valve: Tricuspid valve annuloplasty ring. The tricuspid valve is has been repaired/replaced. Tricuspid valve regurgitation is mild . No evidence of tricuspid stenosis.  Aortic Valve: Unchanged from echo 03/2020. The aortic valve is normal in structure. Aortic valve regurgitation is not visualized. No aortic stenosis is present. Aortic valve mean gradient measures 15.0 mmHg. Aortic valve peak gradient measures 27.7 mmHg. There is a 25 mm [facility] Ease valve present in the aortic position. Procedure Date: 08/03/2010. Echo findings are consistent with normal structure and function of the aortic valve prosthesis.  Pulmonic Valve: The pulmonic valve was normal in structure. Pulmonic valve regurgitation is not visualized. No evidence of pulmonic stenosis.  Aorta: Aortic dilatation noted. There is mild dilatation of the aortic root, measuring 41 mm. There is mild dilatation of the ascending aorta, measuring 36 mm.  Venous: The inferior vena cava is normal in size with greater than 50% respiratory variability, suggesting right atrial pressure of 3 mmHg.  IAS/Shunts: No atrial level shunt detected by color flow Doppler.   LEFT VENTRICLE PLAX 2D LV EF:         Left            Diastology ventricular     LV e' medial:    4.53  cm/s ejection        LV E/e' medial:  42.6 fraction by     LV e' lateral:   6.32 cm/s PLAX is 78      LV E/e' lateral: 30.5 %. LVIDd:         4.90 cm LVIDs:         2.60 cm LV PW:         1.30 cm LV IVS:        1.30 cm         3D Volume EF: 3D EF:        57 % LV EDV:       167 ml LV ESV:       72 ml LV SV:        95 ml  RIGHT VENTRICLE RV S prime:     8.79 cm/s TAPSE (M-mode): 1.0 cm  LEFT ATRIUM              Index LA diam:        4.40 cm  2.15 cm/m LA Vol (A2C):   101.0 ml 49.39 ml/m LA Vol (A4C):   121.0 ml 59.17 ml/m LA Biplane Vol: 116.0 ml 56.73 ml/m AORTIC VALVE AV Vmax:           263.00 cm/s AV Vmean:          180.500 cm/s AV VTI:            0.658 m AV Peak Grad:      27.7 mmHg AV Mean Grad:      15.0 mmHg LVOT Vmax:         125.00 cm/s LVOT Vmean:        86.900 cm/s LVOT VTI:          0.310 m LVOT/AV VTI ratio: 0.47  AORTA Ao Root diam: 4.10 cm Ao Asc diam:  3.60 cm  MITRAL VALVE                TRICUSPID VALVE MV Area (PHT): 1.81 cm     TV Peak grad:   4.8 mmHg MV Peak grad:  23.6 mmHg    TV Mean grad:   2.0 mmHg MV Mean grad:  9.0 mmHg     TV Vmax:        1.09 m/s MV Vmax:       2.43 m/s     TV Vmean:       57.4 cm/s MV Vmean:      144.0 cm/s   TV VTI:         0.40 msec MV Decel Time: 419 msec     TR Peak grad:   42.8 mmHg MV E velocity: 193.00 cm/s  TR Vmax:        327.00 cm/s MV A velocity: 92.40 cm/s MV E/A ratio:  2.09         SHUNTS Systemic VTI: 0.31 m  Skeet Latch MD Electronically signed by Skeet Latch MD Signature Date/Time: 03/22/2021/5:38:40 PM    Final   TEE  ECHO TEE 01/30/2018  Interpretation Summary  Left atrium: Cavity is moderately to severely dilated. No spontaneous echo contrast.  Aortic valve: The valve is trileaflet. There is a bioprosthetic valve present. The prosthetic valve is normal. No stenosis. Trace regurgitation.  Mitral valve: Moderate leaflet thickening is present. Moderate leaflet calcification  is present. Ruptured chordae causing moderate posterior leaflet incompetence. Mild mitral annular calcification. Moderate to severe regurgitation.  Right ventricle: Normal wall thickness and ejection fraction. Cavity is mildly dilated.  Right atrium: Cavity is mildly dilated.  Tricuspid valve: Moderate to severe regurgitation. The tricuspid valve regurgitation jet is central.  Pulmonic valve: Trace regurgitation.  Tricuspid valve: Trace regurgitation.   MONITORS  LONG TERM MONITOR (3-14 DAYS) 04/26/2020  Narrative  Unable to determine rhythm due to baseline artifact however there are periods of sinus rhtyhm noted (see end of monitor result tracings). Average heart rate of 66 bpm  151 SVT runs with fastest averaging 150 bpm lasting 13 beats - Symptoms noted surrounding SVT episode 92 bpm (dizzy, short of breath), no pauses.  Mininmum heart rate of 31 briefly noted during sleep, asymptomatic.  Second degree AV block type 1 Desiree Hane) noted (during sleep, brief episode)  PAC's were frequent at 6.4%, occasional couplets  PVC's were rare  Possible atrial fibrillation/ flutter, no pauses (baseline artifact at times during tracing noted)   Last two ECGs in clinic were atrial flutter with slow response 57 bpm and atrial fibrillation with slow response 55 bpm. Symptoms on monitor were surrounding heart rates of 90 bpm, no pauses.  Candee Furbish, MD            History of Present Illness:   [name]. is a 86 y.o. male with the above problem list.  He was last seen 05/09/21. He underwent L TKR in Sept 2023. He was readmitted with urinary retention and anemia requiring Tx with PRBCs in Sept 2023.  He was admx 11/1-11/5 with weakness in the setting of COVID-19.   He returns for f/u. He is here with his wife [name]). He continues to get tired and somewhat short of breath with more mod exertion. He attributes it to his anemia. He has seen heme and has f/u with Dr. Benay Spice in  Feb 2024. He has repeat labs scheduled for Dec 8. He has not had chest pain, orthopnea, significant edema, syncope.      Past Medical History:  Diagnosis Date   Acquired dilation of ascending aorta and aortic root (HCC)    68m aortic root and 488mascending aorta on echo 03/2020   Arthritis    Bladder stones    Borderline diabetes    BPH (benign prostatic hyperplasia)    CAD (coronary artery disease) 12/19/2020   Non-obstructive coronary artery disease at cath in 2019   Coronary artery disease    cardiologist-  dr skMarlou PorchloCecille Rubinerhart NP--- per cath 06-02-2010 non-obstructive cad pLAD 30-40%   CV[place]cerebral vascular [place]) [place]   Diverticulosis of colon    Dysrhythmia    afib   Gout    Heart murmur    History of adenomatous polyp of colon    2002-- tubular adenoma   History of aortic insufficiency    severe -- s/p  AVR 08-03-2010   History of small bowel obstruction    02/ 2007 mechanical sbo s/p  surgical intervention;  partial sbo 09/ 2011 and 03-20-2011 resolved without surgical intervention   History of urinary retention    HTN (hypertension)    Peripheral neuropathy    Persistent atrial fibrillation (HCC)    S/P aortic valve replacement with prosthetic valve 08/03/2010   tissue valve   S/P Maze operation for atrial fibrillation 01/30/2018   Complete bilateral atrial lesion set using bipolar radiofrequency and cryothermy with clipping of LA appendage   S/P MVR (mitral valve repair) 01/30/2018   Complex valvuloplasty including artificial Gore-tex neochord placement x4 and [employer] Annuloflex ring annuloplasty, size 28   S/P patent foramen ovale closure 08/03/2010   at same time AVR   S/P tricuspid valve repair 01/30/2018   Using an MC3 Annuloplasty ring, size 28   Stroke (HCC)    Tricuspid regurgitation    Current Medications: Current Meds  Medication Sig   acetaminophen (TYLENOL) 500 MG tablet Take 2 tablets (1,000 mg total) by mouth every 6  (six) hours.   allopurinol (ZYLOPRIM) 300 MG tablet Take 150 mg by mouth every morning.   amLODipine (NORVASC) 10 MG tablet TAKE 1 TABLET BY MOUTH DAILY   atenolol (TENORMIN) 25 MG tablet Take 0.5 tablets (12.5 mg total) by mouth daily.   cholecalciferol (VITAMIN D3) 25 MCG (1000 UNIT) tablet Take 1,000 Units by mouth daily.   Cinnamon 500 MG capsule  Take 500 mg by mouth every morning.   feeding supplement (ENSURE ENLIVE / ENSURE PLUS) LIQD Take 237 mLs by mouth 2 (two) times daily between meals.   ferrous sulfate 325 (65 FE) MG EC tablet Take 325 mg by mouth 2 (two) times daily.   finasteride (PROSCAR) 5 MG tablet Take 1 tablet (5 mg total) by mouth daily.   Multiple Vitamins-Minerals (CENTRUM SILVER ADULT 50+) TABS Take 1 tablet by mouth daily.   rosuvastatin (CRESTOR) 10 MG tablet Take 1 tablet (10 mg total) by mouth daily.   silodosin (RAPAFLO) 8 MG CAPS capsule Take 8 mg by mouth daily.   traZODone (DESYREL) 50 MG tablet Take 50 mg by mouth at bedtime as needed for sleep.   warfarin (COUMADIN) 5 MG tablet Take 1 tablet (5 mg total) by mouth daily at 4 PM. Until seen by warfarin clinic.   [DISCONTINUED] gabapentin (NEURONTIN) 300 MG capsule Take 300 mg tablet by mouth in the morning and 300 mg tablet at bedtime    Allergies:   Sulfa antibiotics and Zestril [lisinopril]   Social History   Occupational History   Occupation: RETIRED  Tobacco Use   Smoking status: Never   Smokeless tobacco: Never  Vaping Use   Vaping Use: Never used  Substance and Sexual Activity   Alcohol use: Yes    Comment: ONE OR TWO PER MONTH   Drug use: No   Sexual activity: Not on file    Family Hx: The patient's family history includes Brain cancer in his father; Heart disease in his mother.  Review of Systems  Gastrointestinal:  Negative for hematochezia and melena.  Genitourinary:  Negative for hematuria.     EKGs/Labs/Other Test Reviewed:    EKG:  EKG is not ordered today.     Recent  Labs: 11/10/2021: ALT 14 11/11/2021: BUN 14; Creatinine, Ser 0.77; Hemoglobin 10.8; Magnesium 2.3; Platelets 141; Potassium 3.6; Sodium 142   Recent Lipid Panel Recent Labs    05/22/21 0713  CHOL 130  TRIG 49  HDL 43  VLDL 10  LDLCALC 77      Risk Assessment/Calculations/Metrics:    CHA2DS2-VASc Score = 7   This indicates a 11.2% annual risk of stroke. The patient's score is based upon: CHF History: 1 HTN History: 1 Diabetes History: 0 Stroke History: 2 Vascular Disease History: 1 Age Score: 2 Gender Score: 0             Physical Exam:    VS:  BP 132/62   Pulse 88   Ht 6' (1.829 m)   Wt 168 lb (76.2 kg)   SpO2 97%   BMI 22.78 kg/m     Wt Readings from Last 3 Encounters:  11/29/21 168 lb (76.2 kg)  11/28/21 166 lb (75.3 kg)  11/12/21 169 lb 15.6 oz (77.1 kg)    Constitutional:      Appearance: Healthy appearance. Not in distress.  Neck:     Vascular: JVD normal.  Pulmonary:     Effort: Pulmonary effort is normal.     Breath sounds: No wheezing. No rales.  Cardiovascular:     Normal rate. Irregularly irregular rhythm. Normal S1. S2. accentuated      Murmurs: There is a grade 2/6 systolic murmur at the URSB and LLSB.  Edema:    Peripheral edema present.    Pretibial: trace edema of the left pretibial area. Abdominal:     Palpations: Abdomen is soft.  Skin:    General: Skin is  warm and dry.  Neurological:     General: No focal deficit present.     Mental Status: Alert and oriented to person, place and time.          ASSESSMENT & PLAN:   Valvular heart disease History of bioprosthetic AVR in 2012 and mitral valve repair, tricuspid valve repair in 2020.  Recent echocardiogram with stable AVR, mitral valve repair and tricuspid valve repair.  Continue SBE prophylaxis. F/u echocardiogram due in March 2024.  Permanent atrial fibrillation (HCC) Heart rate is well controlled.  He is tolerating anticoagulation.  Coumadin is managed by our Coumadin clinic.   INR on 11/23/21 was 2.3.  Continue Coumadin as directed, atenolol 12.5 mg daily.  Essential hypertension Blood pressure is well controlled.  Continue amlodipine 10 mg daily, atenolol 12.5 mg daily.  Acquired dilation of ascending aorta and aortic root (HCC) Ascending aorta 36 mm and aortic root 41 mm by echocardiogram in March 2023.  Plan follow-up echocardiogram in March 2024.  CAD (coronary artery disease) Nonobstructive CAD by cardiac catheterization in 2019.  He is not having anginal symptoms.  He is not on aspirin as he is on warfarin.  Continue rosuvastatin 10 mg daily.  HLD (hyperlipidemia) LDL optimal in May 2023. Continue Crestor 10 mg once daily.            Dispo:  Return in about 5 months (around 04/30/2022) for Routine Follow Up, w/ [name], PA-C.   Medication Adjustments/Labs and Tests Ordered: Current medicines are reviewed at length with the patient today.  Concerns regarding medicines are outlined above.  Tests Ordered: No orders of the defined types were placed in this encounter.  Medication Changes: No orders of the defined types were placed in this encounter.  Signed, [name], PA-C  11/29/2021 10:18 AM    [facility] [place], [place],   [address] Phone: [phone]; Fax: [phone]

## 2021-11-28 NOTE — Patient Instructions (Addendum)
Increase gabapentin to '300mg'$  twice daily - if unable to tolerate morning dosage, can take 2 '300mg'$  capsules at night   Hopefully the fitness program will be helpful for your balance but if not and you would like to do some additional physical therapy, please let me know  Continue to follow with your primary doctor for secondary stroke prevention measures    Follow up in 8 months or call earlier if needed

## 2021-11-29 ENCOUNTER — Ambulatory Visit: Payer: Medicare Other | Attending: Physician Assistant | Admitting: Physician Assistant

## 2021-11-29 ENCOUNTER — Other Ambulatory Visit: Payer: Self-pay | Admitting: Neurology

## 2021-11-29 ENCOUNTER — Encounter: Payer: Self-pay | Admitting: Physician Assistant

## 2021-11-29 VITALS — BP 132/62 | HR 88 | Ht 72.0 in | Wt 168.0 lb

## 2021-11-29 DIAGNOSIS — I77819 Aortic ectasia, unspecified site: Secondary | ICD-10-CM | POA: Diagnosis not present

## 2021-11-29 DIAGNOSIS — I38 Endocarditis, valve unspecified: Secondary | ICD-10-CM | POA: Diagnosis not present

## 2021-11-29 DIAGNOSIS — I4821 Permanent atrial fibrillation: Secondary | ICD-10-CM | POA: Diagnosis not present

## 2021-11-29 DIAGNOSIS — I2510 Atherosclerotic heart disease of native coronary artery without angina pectoris: Secondary | ICD-10-CM | POA: Diagnosis not present

## 2021-11-29 DIAGNOSIS — E7800 Pure hypercholesterolemia, unspecified: Secondary | ICD-10-CM | POA: Diagnosis not present

## 2021-11-29 DIAGNOSIS — I10 Essential (primary) hypertension: Secondary | ICD-10-CM

## 2021-11-29 MED ORDER — GABAPENTIN 300 MG PO CAPS: 300.0000 mg | ORAL_CAPSULE | Freq: Two times a day (BID) | ORAL | 1 refills | Status: DC

## 2021-11-29 NOTE — Patient Instructions (Signed)
Medication Instructions:  Your physician recommends that you continue on your current medications as directed. Please refer to the Current Medication list given to you today.  *If you need a refill on your cardiac medications before your next appointment, please call your pharmacy*   Lab Work: None ordered  If you have labs (blood work) drawn today and your tests are completely normal, you will receive your results only by: [place] (if you have MyChart) OR A paper copy in the mail If you have any lab test that is abnormal or we need to change your treatment, we will call you to review the results.   Testing/Procedures: None ordered today, but will get the Echocardiogram scheduled that is due in March 2024   Follow-Up: At [facility], you and your health needs are our priority.  As part of our continuing mission to provide you with exceptional heart care, we have created designated Provider Care Teams.  These Care Teams include your primary Cardiologist (physician) and Advanced Practice Providers (APPs -  Physician Assistants and Nurse Practitioners) who all work together to provide you with the care you need, when you need it.  We recommend signing up for the patient portal called "MyChart".  Sign up information is provided on this After Visit Summary.  MyChart is used to connect with patients for Virtual Visits (Telemedicine).  Patients are able to view lab/test results, encounter notes, upcoming appointments, etc.  Non-urgent messages can be sent to your provider as well.   To learn more about what you can do with MyChart, go to [email or web address].    Your next appointment:   04/2022 (after Echo)  The format for your next appointment:   In Person  Provider:   [name], PA-C         Other Instructions   Important Information About Sugar

## 2021-11-29 NOTE — Assessment & Plan Note (Signed)
History of bioprosthetic AVR in 2012 and mitral valve repair, tricuspid valve repair in 2020.  Recent echocardiogram with stable AVR, mitral valve repair and tricuspid valve repair.  Continue SBE prophylaxis. F/u echocardiogram due in March 2024.

## 2021-11-29 NOTE — Assessment & Plan Note (Signed)
Heart rate is well controlled.  He is tolerating anticoagulation.  Coumadin is managed by our Coumadin clinic.  INR on 11/23/21 was 2.3.  Continue Coumadin as directed, atenolol 12.5 mg daily.

## 2021-11-29 NOTE — Assessment & Plan Note (Signed)
LDL optimal in May 2023. Continue Crestor 10 mg once daily.

## 2021-11-29 NOTE — Assessment & Plan Note (Signed)
Ascending aorta 36 mm and aortic root 41 mm by echocardiogram in March 2023.  Plan follow-up echocardiogram in March 2024.

## 2021-11-29 NOTE — Assessment & Plan Note (Signed)
Nonobstructive CAD by cardiac catheterization in 2019.  He is not having anginal symptoms.  He is not on aspirin as he is on warfarin.  Continue rosuvastatin 10 mg daily.

## 2021-11-29 NOTE — Assessment & Plan Note (Signed)
Blood pressure is well controlled.  Continue amlodipine 10 mg daily, atenolol 12.5 mg daily.

## 2021-12-15 ENCOUNTER — Inpatient Hospital Stay: Payer: Medicare Other | Attending: Nurse Practitioner

## 2021-12-15 DIAGNOSIS — D649 Anemia, unspecified: Secondary | ICD-10-CM | POA: Diagnosis not present

## 2021-12-15 LAB — CBC WITH DIFFERENTIAL (CANCER CENTER ONLY)
Abs Immature Granulocytes: 0.02 10*3/uL (ref 0.00–0.07)
Basophils Absolute: 0 10*3/uL (ref 0.0–0.1)
Basophils Relative: 0 %
Eosinophils Absolute: 0.1 10*3/uL (ref 0.0–0.5)
Eosinophils Relative: 2 %
HCT: 33.3 % — ABNORMAL LOW (ref 39.0–52.0)
Hemoglobin: 10.6 g/dL — ABNORMAL LOW (ref 13.0–17.0)
Immature Granulocytes: 0 %
Lymphocytes Relative: 21 %
Lymphs Abs: 1.3 10*3/uL (ref 0.7–4.0)
MCH: 30.7 pg (ref 26.0–34.0)
MCHC: 31.8 g/dL (ref 30.0–36.0)
MCV: 96.5 fL (ref 80.0–100.0)
Monocytes Absolute: 0.5 10*3/uL (ref 0.1–1.0)
Monocytes Relative: 8 %
Neutro Abs: 4.2 10*3/uL (ref 1.7–7.7)
Neutrophils Relative %: 69 %
Platelet Count: 147 10*3/uL — ABNORMAL LOW (ref 150–400)
RBC: 3.45 MIL/uL — ABNORMAL LOW (ref 4.22–5.81)
RDW: 15.6 % — ABNORMAL HIGH (ref 11.5–15.5)
WBC Count: 6.3 10*3/uL (ref 4.0–10.5)
nRBC: 0 % (ref 0.0–0.2)

## 2021-12-15 LAB — FERRITIN: Ferritin: 89 ng/mL (ref 24–336)

## 2021-12-19 DIAGNOSIS — L570 Actinic keratosis: Secondary | ICD-10-CM | POA: Diagnosis not present

## 2021-12-19 DIAGNOSIS — D485 Neoplasm of uncertain behavior of skin: Secondary | ICD-10-CM | POA: Diagnosis not present

## 2021-12-19 DIAGNOSIS — L565 Disseminated superficial actinic porokeratosis (DSAP): Secondary | ICD-10-CM | POA: Diagnosis not present

## 2021-12-19 DIAGNOSIS — L309 Dermatitis, unspecified: Secondary | ICD-10-CM | POA: Diagnosis not present

## 2021-12-19 DIAGNOSIS — C44329 Squamous cell carcinoma of skin of other parts of face: Secondary | ICD-10-CM | POA: Diagnosis not present

## 2021-12-21 ENCOUNTER — Ambulatory Visit: Payer: Medicare Other | Attending: Cardiovascular Disease

## 2021-12-21 DIAGNOSIS — I4891 Unspecified atrial fibrillation: Secondary | ICD-10-CM

## 2021-12-21 DIAGNOSIS — Z5181 Encounter for therapeutic drug level monitoring: Secondary | ICD-10-CM | POA: Diagnosis not present

## 2021-12-21 DIAGNOSIS — I639 Cerebral infarction, unspecified: Secondary | ICD-10-CM

## 2021-12-21 LAB — POCT INR: INR: 2 (ref 2.0–3.0)

## 2021-12-21 NOTE — Patient Instructions (Addendum)
Description   Take an extra 1/2 tablet today and then continue taking 1 tablet daily except for 1.5 tablets on Mondays.  Stay consistent with greens each week.  Recheck INR in 5 weeks.  [facility] [phone]

## 2021-12-23 ENCOUNTER — Other Ambulatory Visit: Payer: Self-pay | Admitting: Physician Assistant

## 2021-12-28 ENCOUNTER — Telehealth: Payer: Self-pay

## 2021-12-28 NOTE — Telephone Encounter (Signed)
Patient gave verbal understanding and had no further questions or concerns  

## 2021-12-28 NOTE — Telephone Encounter (Signed)
-----   Message from Owens Shark, NP sent at 12/28/2021 11:54 AM EST ----- Please let him know hemoglobin is stable.  Follow-up as scheduled.

## 2022-01-11 DIAGNOSIS — C44329 Squamous cell carcinoma of skin of other parts of face: Secondary | ICD-10-CM | POA: Diagnosis not present

## 2022-01-17 ENCOUNTER — Encounter: Payer: Medicare Other | Admitting: Nurse Practitioner

## 2022-01-19 DIAGNOSIS — Z96652 Presence of left artificial knee joint: Secondary | ICD-10-CM | POA: Diagnosis not present

## 2022-01-19 DIAGNOSIS — M1711 Unilateral primary osteoarthritis, right knee: Secondary | ICD-10-CM | POA: Diagnosis not present

## 2022-01-19 DIAGNOSIS — M25561 Pain in right knee: Secondary | ICD-10-CM | POA: Diagnosis not present

## 2022-01-22 DIAGNOSIS — R3914 Feeling of incomplete bladder emptying: Secondary | ICD-10-CM | POA: Diagnosis not present

## 2022-01-22 DIAGNOSIS — R8271 Bacteriuria: Secondary | ICD-10-CM | POA: Diagnosis not present

## 2022-01-25 ENCOUNTER — Ambulatory Visit: Payer: Medicare Other | Attending: Cardiology

## 2022-01-25 DIAGNOSIS — I4891 Unspecified atrial fibrillation: Secondary | ICD-10-CM | POA: Diagnosis not present

## 2022-01-25 DIAGNOSIS — Z5181 Encounter for therapeutic drug level monitoring: Secondary | ICD-10-CM | POA: Diagnosis not present

## 2022-01-25 DIAGNOSIS — I639 Cerebral infarction, unspecified: Secondary | ICD-10-CM

## 2022-01-25 LAB — POCT INR: INR: 1.8 — AB (ref 2.0–3.0)

## 2022-01-25 NOTE — Patient Instructions (Signed)
Description   Take an extra tablet today and then continue taking 1 tablet daily except for 1.5 tablets on Mondays.  Stay consistent with greens each week.  Recheck INR in 4 weeks.  [facility] [phone]

## 2022-01-30 ENCOUNTER — Other Ambulatory Visit: Payer: Self-pay | Admitting: Physician Assistant

## 2022-02-07 DIAGNOSIS — M62552 Muscle wasting and atrophy, not elsewhere classified, left thigh: Secondary | ICD-10-CM | POA: Diagnosis not present

## 2022-02-07 DIAGNOSIS — M62561 Muscle wasting and atrophy, not elsewhere classified, right lower leg: Secondary | ICD-10-CM | POA: Diagnosis not present

## 2022-02-07 DIAGNOSIS — M62562 Muscle wasting and atrophy, not elsewhere classified, left lower leg: Secondary | ICD-10-CM | POA: Diagnosis not present

## 2022-02-07 DIAGNOSIS — R278 Other lack of coordination: Secondary | ICD-10-CM | POA: Diagnosis not present

## 2022-02-07 DIAGNOSIS — M62551 Muscle wasting and atrophy, not elsewhere classified, right thigh: Secondary | ICD-10-CM | POA: Diagnosis not present

## 2022-02-07 DIAGNOSIS — R2681 Unsteadiness on feet: Secondary | ICD-10-CM | POA: Diagnosis not present

## 2022-02-07 DIAGNOSIS — R2689 Other abnormalities of gait and mobility: Secondary | ICD-10-CM | POA: Diagnosis not present

## 2022-02-08 DIAGNOSIS — M62561 Muscle wasting and atrophy, not elsewhere classified, right lower leg: Secondary | ICD-10-CM | POA: Diagnosis not present

## 2022-02-08 DIAGNOSIS — Z5189 Encounter for other specified aftercare: Secondary | ICD-10-CM | POA: Diagnosis not present

## 2022-02-08 DIAGNOSIS — M62551 Muscle wasting and atrophy, not elsewhere classified, right thigh: Secondary | ICD-10-CM | POA: Diagnosis not present

## 2022-02-08 DIAGNOSIS — R2689 Other abnormalities of gait and mobility: Secondary | ICD-10-CM | POA: Diagnosis not present

## 2022-02-08 DIAGNOSIS — Z85828 Personal history of other malignant neoplasm of skin: Secondary | ICD-10-CM | POA: Diagnosis not present

## 2022-02-08 DIAGNOSIS — M62552 Muscle wasting and atrophy, not elsewhere classified, left thigh: Secondary | ICD-10-CM | POA: Diagnosis not present

## 2022-02-08 DIAGNOSIS — R2681 Unsteadiness on feet: Secondary | ICD-10-CM | POA: Diagnosis not present

## 2022-02-08 DIAGNOSIS — M62562 Muscle wasting and atrophy, not elsewhere classified, left lower leg: Secondary | ICD-10-CM | POA: Diagnosis not present

## 2022-02-12 DIAGNOSIS — R488 Other symbolic dysfunctions: Secondary | ICD-10-CM | POA: Diagnosis not present

## 2022-02-13 DIAGNOSIS — M62562 Muscle wasting and atrophy, not elsewhere classified, left lower leg: Secondary | ICD-10-CM | POA: Diagnosis not present

## 2022-02-13 DIAGNOSIS — R2689 Other abnormalities of gait and mobility: Secondary | ICD-10-CM | POA: Diagnosis not present

## 2022-02-13 DIAGNOSIS — M62552 Muscle wasting and atrophy, not elsewhere classified, left thigh: Secondary | ICD-10-CM | POA: Diagnosis not present

## 2022-02-13 DIAGNOSIS — M62551 Muscle wasting and atrophy, not elsewhere classified, right thigh: Secondary | ICD-10-CM | POA: Diagnosis not present

## 2022-02-13 DIAGNOSIS — M62561 Muscle wasting and atrophy, not elsewhere classified, right lower leg: Secondary | ICD-10-CM | POA: Diagnosis not present

## 2022-02-14 DIAGNOSIS — M62551 Muscle wasting and atrophy, not elsewhere classified, right thigh: Secondary | ICD-10-CM | POA: Diagnosis not present

## 2022-02-14 DIAGNOSIS — R2681 Unsteadiness on feet: Secondary | ICD-10-CM | POA: Diagnosis not present

## 2022-02-14 DIAGNOSIS — M62562 Muscle wasting and atrophy, not elsewhere classified, left lower leg: Secondary | ICD-10-CM | POA: Diagnosis not present

## 2022-02-14 DIAGNOSIS — M62552 Muscle wasting and atrophy, not elsewhere classified, left thigh: Secondary | ICD-10-CM | POA: Diagnosis not present

## 2022-02-14 DIAGNOSIS — R278 Other lack of coordination: Secondary | ICD-10-CM | POA: Diagnosis not present

## 2022-02-14 DIAGNOSIS — M62561 Muscle wasting and atrophy, not elsewhere classified, right lower leg: Secondary | ICD-10-CM | POA: Diagnosis not present

## 2022-02-14 DIAGNOSIS — R2689 Other abnormalities of gait and mobility: Secondary | ICD-10-CM | POA: Diagnosis not present

## 2022-02-16 ENCOUNTER — Emergency Department (HOSPITAL_COMMUNITY): Payer: Medicare Other

## 2022-02-16 ENCOUNTER — Emergency Department (HOSPITAL_COMMUNITY)
Admission: EM | Admit: 2022-02-16 | Discharge: 2022-02-17 | Disposition: A | Discharge status: Home / Self Care | Payer: Medicare Other | Attending: Emergency Medicine | Admitting: Emergency Medicine

## 2022-02-16 ENCOUNTER — Inpatient Hospital Stay: Payer: Medicare Other

## 2022-02-16 ENCOUNTER — Other Ambulatory Visit: Payer: Self-pay

## 2022-02-16 ENCOUNTER — Inpatient Hospital Stay: Payer: Medicare Other | Attending: Nurse Practitioner | Admitting: Oncology

## 2022-02-16 VITALS — BP 143/66 | HR 88 | Temp 98.1°F | Resp 20 | Ht 72.0 in | Wt 177.4 lb

## 2022-02-16 DIAGNOSIS — R8290 Unspecified abnormal findings in urine: Secondary | ICD-10-CM | POA: Diagnosis not present

## 2022-02-16 DIAGNOSIS — I10 Essential (primary) hypertension: Secondary | ICD-10-CM | POA: Diagnosis not present

## 2022-02-16 DIAGNOSIS — D649 Anemia, unspecified: Secondary | ICD-10-CM

## 2022-02-16 DIAGNOSIS — M62551 Muscle wasting and atrophy, not elsewhere classified, right thigh: Secondary | ICD-10-CM | POA: Diagnosis not present

## 2022-02-16 DIAGNOSIS — R5383 Other fatigue: Secondary | ICD-10-CM | POA: Diagnosis not present

## 2022-02-16 DIAGNOSIS — M62561 Muscle wasting and atrophy, not elsewhere classified, right lower leg: Secondary | ICD-10-CM | POA: Diagnosis not present

## 2022-02-16 DIAGNOSIS — M47814 Spondylosis without myelopathy or radiculopathy, thoracic region: Secondary | ICD-10-CM | POA: Diagnosis not present

## 2022-02-16 DIAGNOSIS — Z20822 Contact with and (suspected) exposure to covid-19: Secondary | ICD-10-CM | POA: Insufficient documentation

## 2022-02-16 DIAGNOSIS — R531 Weakness: Secondary | ICD-10-CM | POA: Insufficient documentation

## 2022-02-16 DIAGNOSIS — Z7901 Long term (current) use of anticoagulants: Secondary | ICD-10-CM | POA: Diagnosis not present

## 2022-02-16 DIAGNOSIS — I509 Heart failure, unspecified: Secondary | ICD-10-CM | POA: Diagnosis not present

## 2022-02-16 DIAGNOSIS — R079 Chest pain, unspecified: Secondary | ICD-10-CM | POA: Diagnosis not present

## 2022-02-16 DIAGNOSIS — I110 Hypertensive heart disease with heart failure: Secondary | ICD-10-CM | POA: Diagnosis not present

## 2022-02-16 DIAGNOSIS — Z79899 Other long term (current) drug therapy: Secondary | ICD-10-CM | POA: Insufficient documentation

## 2022-02-16 DIAGNOSIS — M4802 Spinal stenosis, cervical region: Secondary | ICD-10-CM | POA: Diagnosis not present

## 2022-02-16 DIAGNOSIS — S199XXA Unspecified injury of neck, initial encounter: Secondary | ICD-10-CM | POA: Diagnosis not present

## 2022-02-16 DIAGNOSIS — M47812 Spondylosis without myelopathy or radiculopathy, cervical region: Secondary | ICD-10-CM | POA: Diagnosis not present

## 2022-02-16 DIAGNOSIS — R2689 Other abnormalities of gait and mobility: Secondary | ICD-10-CM | POA: Diagnosis not present

## 2022-02-16 DIAGNOSIS — R339 Retention of urine, unspecified: Secondary | ICD-10-CM | POA: Insufficient documentation

## 2022-02-16 DIAGNOSIS — I6523 Occlusion and stenosis of bilateral carotid arteries: Secondary | ICD-10-CM | POA: Diagnosis not present

## 2022-02-16 DIAGNOSIS — M62552 Muscle wasting and atrophy, not elsewhere classified, left thigh: Secondary | ICD-10-CM | POA: Diagnosis not present

## 2022-02-16 DIAGNOSIS — R509 Fever, unspecified: Secondary | ICD-10-CM | POA: Diagnosis not present

## 2022-02-16 DIAGNOSIS — R55 Syncope and collapse: Secondary | ICD-10-CM | POA: Diagnosis not present

## 2022-02-16 DIAGNOSIS — Z743 Need for continuous supervision: Secondary | ICD-10-CM | POA: Diagnosis not present

## 2022-02-16 DIAGNOSIS — M62562 Muscle wasting and atrophy, not elsewhere classified, left lower leg: Secondary | ICD-10-CM | POA: Diagnosis not present

## 2022-02-16 LAB — CBC
HCT: 35.3 % — ABNORMAL LOW (ref 39.0–52.0)
Hemoglobin: 11.6 g/dL — ABNORMAL LOW (ref 13.0–17.0)
MCH: 31.7 pg (ref 26.0–34.0)
MCHC: 32.9 g/dL (ref 30.0–36.0)
MCV: 96.4 fL (ref 80.0–100.0)
Platelets: 122 10*3/uL — ABNORMAL LOW (ref 150–400)
RBC: 3.66 MIL/uL — ABNORMAL LOW (ref 4.22–5.81)
RDW: 15.3 % (ref 11.5–15.5)
WBC: 5.8 10*3/uL (ref 4.0–10.5)
nRBC: 0 % (ref 0.0–0.2)

## 2022-02-16 LAB — RESP PANEL BY RT-PCR (RSV, FLU A&B, COVID)  RVPGX2
Influenza A by PCR: NEGATIVE
Influenza B by PCR: NEGATIVE
Resp Syncytial Virus by PCR: NEGATIVE
SARS Coronavirus 2 by RT PCR: NEGATIVE

## 2022-02-16 LAB — CBC WITH DIFFERENTIAL (CANCER CENTER ONLY)
Abs Immature Granulocytes: 0.01 10*3/uL (ref 0.00–0.07)
Basophils Absolute: 0 10*3/uL (ref 0.0–0.1)
Basophils Relative: 0 %
Eosinophils Absolute: 0.1 10*3/uL (ref 0.0–0.5)
Eosinophils Relative: 1 %
HCT: 36.9 % — ABNORMAL LOW (ref 39.0–52.0)
Hemoglobin: 12.2 g/dL — ABNORMAL LOW (ref 13.0–17.0)
Immature Granulocytes: 0 %
Lymphocytes Relative: 10 %
Lymphs Abs: 0.7 10*3/uL (ref 0.7–4.0)
MCH: 31.4 pg (ref 26.0–34.0)
MCHC: 33.1 g/dL (ref 30.0–36.0)
MCV: 95.1 fL (ref 80.0–100.0)
Monocytes Absolute: 0.5 10*3/uL (ref 0.1–1.0)
Monocytes Relative: 7 %
Neutro Abs: 5.8 10*3/uL (ref 1.7–7.7)
Neutrophils Relative %: 82 %
Platelet Count: 140 10*3/uL — ABNORMAL LOW (ref 150–400)
RBC: 3.88 MIL/uL — ABNORMAL LOW (ref 4.22–5.81)
RDW: 15.1 % (ref 11.5–15.5)
WBC Count: 7.1 10*3/uL (ref 4.0–10.5)
nRBC: 0 % (ref 0.0–0.2)

## 2022-02-16 LAB — COMPREHENSIVE METABOLIC PANEL
ALT: 27 U/L (ref 0–44)
AST: 30 U/L (ref 15–41)
Albumin: 4.1 g/dL (ref 3.5–5.0)
Alkaline Phosphatase: 49 U/L (ref 38–126)
Anion gap: 9 (ref 5–15)
BUN: 21 mg/dL (ref 8–23)
CO2: 22 mmol/L (ref 22–32)
Calcium: 8.6 mg/dL — ABNORMAL LOW (ref 8.9–10.3)
Chloride: 108 mmol/L (ref 98–111)
Creatinine, Ser: 0.88 mg/dL (ref 0.61–1.24)
GFR, Estimated: >60 mL/min (ref >60)
Glucose, Bld: 117 mg/dL — ABNORMAL HIGH (ref 70–99)
Potassium: 3.5 mmol/L (ref 3.5–5.1)
Sodium: 139 mmol/L (ref 135–145)
Total Bilirubin: 1.1 mg/dL (ref 0.3–1.2)
Total Protein: 7.4 g/dL (ref 6.5–8.1)

## 2022-02-16 LAB — TROPONIN I (HIGH SENSITIVITY): Troponin I (High Sensitivity): 22 ng/L — ABNORMAL HIGH (ref <18)

## 2022-02-16 LAB — PROTIME-INR
INR: 1.8 — ABNORMAL HIGH (ref 0.8–1.2)
Prothrombin Time: 20.8 seconds — ABNORMAL HIGH (ref 11.4–15.2)

## 2022-02-16 LAB — FERRITIN: Ferritin: 103 ng/mL (ref 24–336)

## 2022-02-16 NOTE — ED Provider Notes (Signed)
[facility] EMERG[facility]Y DEPARTMENT AT [facility] Provider Note   CSN: [id number] Arrival date & time: 02/16/22  2041     History {Add pertinent medical, surgical, social history, OB history to HPI:1} Chief Complaint  Patient presents with   Weakness    [name]. is a 87 y.o. male with past medical history significant for hypertension, BPH, status post aortic valve replacement, previous history of stroke, permanent A-fib, with previously attempted maze procedure, malnutrition, thrombocytopenia, prediabetes, congestive heart failure who presents with concern for generalized weakness.  Patient reports that he lowered himself to the floor twice today, stood up with assistance from son, he denies any falls, he does take warfarin.  Denies inability to urinate recently, he takes silodosin secondary to urinary retention.   Weakness      Home Medications Prior to Admission medications   Medication Sig Start Date End Date Taking? Authorizing Provider  acetaminophen (TYLENOL) 500 MG tablet Take 2 tablets (1,000 mg total) by mouth every 6 (six) hours. Patient not taking: Reported on 02/16/2022 09/20/21   Irving Copas, PA-C  allopurinol (ZYLOPRIM) 300 MG tablet Take 150 mg by mouth every morning.    [provider]  amLODipine (NORVASC) 10 MG tablet TAKE 1 TABLET BY MOUTH DAILY 10/30/21   Jerline Pain, MD  atenolol (TENORMIN) 25 MG tablet Take 0.5 tablets (12.5 mg total) by mouth daily. 12/25/21   Jerline Pain, MD  cholecalciferol (VITAMIN D3) 25 MCG (1000 UNIT) tablet Take 1,000 Units by mouth daily.    [provider]  Cinnamon 500 MG capsule Take 500 mg by mouth every morning.    [provider]  ferrous sulfate 325 (65 FE) MG EC tablet Take 325 mg by mouth 2 (two) times daily. 10/29/19   [provider]  finasteride (PROSCAR) 5 MG tablet Take 1 tablet (5 mg total) by mouth daily. 11/05/16   Angiulli, Lavon Paganini, PA-C  gabapentin  (NEURONTIN) 300 MG capsule Take 1 capsule (300 mg total) by mouth 2 (two) times daily. 11/29/21   Frann Rider, NP  Multiple Vitamins-Minerals (CENTRUM SILVER ADULT 50+) TABS Take 1 tablet by mouth daily.    [provider]  rosuvastatin (CRESTOR) 10 MG tablet TAKE 1 TABLET BY MOUTH DAILY 01/31/22   [name] T, PA-C  silodosin (RAPAFLO) 8 MG CAPS capsule Take 8 mg by mouth daily. 11/02/21   [provider]  traZODone (DESYREL) 50 MG tablet Take 50 mg by mouth at bedtime as needed for sleep.    [provider]  warfarin (COUMADIN) 5 MG tablet Take 1 tablet (5 mg total) by mouth daily at 4 PM. Until seen by warfarin clinic. 05/24/21   Lavina Hamman, MD      Allergies    Sulfa antibiotics and Zestril [lisinopril]    Review of Systems   Review of Systems  Neurological:  Positive for weakness.  All other systems reviewed and are negative.   Physical Exam Updated Vital Signs Temp 99.6 F (37.6 C) (Oral)   Ht 6' (1.829 m)   Wt 80.3 kg   BMI 24.01 kg/m  Physical Exam Vitals and nursing note reviewed.  Constitutional:      General: He is not in acute distress.    Appearance: Normal appearance.  HENT:     Head: Normocephalic and atraumatic.  Eyes:     General:        Right eye: No discharge.  Left eye: No discharge.  Cardiovascular:     Rate and Rhythm: Normal rate and regular rhythm.     Heart sounds: No murmur heard.    No friction rub. No gallop.  Pulmonary:     Effort: Pulmonary effort is normal.     Breath sounds: Normal breath sounds.  Abdominal:     General: Bowel sounds are normal.     Palpations: Abdomen is soft.  Skin:    General: Skin is warm and dry.     Capillary Refill: Capillary refill takes less than 2 seconds.  Neurological:     Mental Status: He is alert. Mental status is at baseline.     Comments: Cranial nerves II through XII grossly intact.  Intact finger-nose, intact heel-to-shin.  Romberg negative, gait normal.   Alert and oriented x3.  Moves all 4 limbs spontaneously, normal coordination.  No pronator drift.  Intact strength 4 out of 5 bilateral upper and lower extremities -- global weakness but no focal localizing or unilateral weakness     Psychiatric:        Mood and Affect: Mood normal.        Behavior: Behavior normal.     ED Results / Procedures / Treatments   Labs (all labs ordered are listed, but only abnormal results are displayed) Labs Reviewed  RESP PANEL BY RT-PCR (RSV, FLU A&B, COVID)  RVPGX2  CBC  COMPREHENSIVE METABOLIC PANEL  PROTIME-INR  TROPONIN I (HIGH SENSITIVITY)    EKG None  Radiology No results found.  Procedures Procedures  {Document cardiac monitor, telemetry assessment procedure when appropriate:1}  Medications Ordered in ED Medications - No data to display  ED Course/ Medical Decision Making/ A&P Clinical Course as of 02/16/22 2344  Fri Feb 09, 309  [id number] 87 year old male who presents with generalized weakness this morning, felt that he was unable to support himself, fell to the ground, he denies any chest pain, he reports mild shortness of breath.  Denies any fever, chills [CP]    Clinical Course User Index [CP] Elizer Bostic H, PA-C   {   Click here for ABCD2, HEART and other calculatorsREFRESH Note before signing :1}                          Medical Decision Making Amount and/or Complexity of Data Reviewed Labs: ordered. Radiology: ordered.   This patient is a 87 y.o. male who presents to the ED for concern of weakness, this involves an extensive number of treatment options, and is a complaint that carries with it a high risk of complications and morbidity. The emergent differential diagnosis prior to evaluation includes, but is not limited to,  CVA, spinal cord injury, ACS, arrhythmia, syncope, orthostatic hypotension, sepsis, hypoglycemia, hypoxia, electrolyte disturbance, endocrine disorder, anemia, environmental exposure, polypharmacy  . This is not an exhaustive differential.   Past Medical History / Co-morbidities / Social History: hypertension, BPH, status post aortic valve replacement, previous history of stroke, permanent A-fib, with previously attempted maze procedure, malnutrition, thrombocytopenia, prediabetes, congestive heart failure  Additional history: Chart reviewed. Pertinent results include: ***  Physical Exam: Physical exam performed. The pertinent findings include: ***  Lab Tests: I ordered, and personally interpreted labs.  The pertinent results include:  ***   Imaging Studies: I ordered imaging studies including ***. I independently visualized and interpreted imaging which showed ***. I agree with the radiologist interpretation.   Cardiac Monitoring:  The patient was maintained on a  cardiac monitor.  My attending physician Dr. Marland Kitchen viewed and interpreted the cardiac monitored which showed an underlying rhythm of: ***. I agree with this interpretation.   Medications: I ordered medication including ***  for ***. Reevaluation of the patient after these medicines showed that the patient {resolved/improved/worsened:23923::"improved"}. I have reviewed the patients home medicines and have made adjustments as needed.  Consultations Obtained: I requested consultation with the ***,  and discussed lab and imaging findings as well as pertinent plan - they recommend: ***   Disposition: After consideration of the diagnostic results and the patients response to treatment, I feel that *** .   ***emergency department workup does not suggest an emergent condition requiring admission or immediate intervention beyond what has been performed at this time. The plan is: ***. The patient is safe for discharge and has been instructed to return immediately for worsening symptoms, change in symptoms or any other concerns.  I discussed this case with my attending physician Dr. Marland Kitchen who cosigned this note including patient's  presenting symptoms, physical exam, and planned diagnostics and interventions. Attending physician stated agreement with plan or made changes to plan which were implemented.    Final Clinical Impression(s) / ED Diagnoses Final diagnoses:  None    Rx / DC Orders ED Discharge Orders     None

## 2022-02-16 NOTE — Progress Notes (Signed)
  [facility] OFFICE PROGRESS NOTE   Diagnosis: Anemia  INTERVAL HISTORY:   [name] returns as scheduled.  He feels well.  Good appetite.  Good energy level.  No bleeding.  He is taking iron 2-3 times per day.  Objective:  Vital signs in last 24 hours:  Blood pressure (!) 143/66, pulse 88, temperature 98.1 F (36.7 C), temperature source Oral, resp. rate 20, height 6' (1.829 m), weight 177 lb 6.4 oz (80.5 kg), SpO2 98 %.   Lymphatics: No cervical, supraclavicular, axillary, or inguinal nodes Resp: Lungs clear bilaterally Cardio: Irregular GI: No hepatosplenomegaly Vascular: No leg edema    Portacath/PICC-without erythema  Lab Results:  Lab Results  Component Value Date   WBC 7.1 02/16/2022   HGB 12.2 (L) 02/16/2022   HCT 36.9 (L) 02/16/2022   MCV 95.1 02/16/2022   PLT 140 (L) 02/16/2022   NEUTROABS 5.8 02/16/2022    CMP  Lab Results  Component Value Date   NA 142 11/11/2021   K 3.6 11/11/2021   CL 109 11/11/2021   CO2 24 11/11/2021   GLUCOSE 101 (H) 11/11/2021   BUN 14 11/11/2021   CREATININE 0.77 11/11/2021   CALCIUM 8.7 (L) 11/11/2021   PROT 7.0 11/10/2021   ALBUMIN 3.5 11/10/2021   AST 23 11/10/2021   ALT 14 11/10/2021   ALKPHOS 43 11/10/2021   BILITOT 0.6 11/10/2021   GFRNONAA >60 11/11/2021   GFRAA 83 11/25/2019     Medications: I have reviewed the patient's current medications.   Assessment/Plan: Anemia Normal ferritin, normal vitamin B12, and negative stool Hemoccult cards January 2023 Left hip fracture July 2022 status post left hip replacement  Hospitalization August 2020 with symptomatic anemia, heme positive stool, ferritin 19, status post EGD;  colonoscopy 10/21/2018, gastritis with hemorrhage Atrial fibrillation on Coumadin CAD AVR Mitral valve repair Left total knee replacement 09/19/2021 Admission with urinary retention 09/23/2021 Chronic mild thrombocytopenia     Disposition: [name] appears well.  The  hemoglobin is improved and is now mildly decreased.  He will continue ferrous sulfate twice daily.  He will return for an office and lab visit in 4 months.  Anemia over the past few years is likely related to surgical blood loss and chronic GI bleeding.  Betsy Coder, MD  02/16/2022  10:13 AM

## 2022-02-16 NOTE — ED Notes (Addendum)
Pt O2 sats maintained in low 90's. Pt placed on 2L . Sats are now at 63. DO aware.

## 2022-02-16 NOTE — ED Triage Notes (Signed)
Pt BIB EMS from independent living with c/o weakness, lowered himself to the floor twice. Stood up with assistance from son and staff both times. Denies falls. Takes thinners. Urine retention but takes fluid pills, silodosin.   Sister is requesting to check for spinal tumor.  BP 148/72 Afib 70's  CBG 144 95% RA clear lungs

## 2022-02-17 DIAGNOSIS — R531 Weakness: Secondary | ICD-10-CM | POA: Diagnosis not present

## 2022-02-17 DIAGNOSIS — Z7401 Bed confinement status: Secondary | ICD-10-CM | POA: Diagnosis not present

## 2022-02-17 LAB — URINALYSIS, ROUTINE W REFLEX MICROSCOPIC
Bilirubin Urine: NEGATIVE
Glucose, UA: NEGATIVE mg/dL
Hgb urine dipstick: NEGATIVE
Ketones, ur: NEGATIVE mg/dL
Nitrite: NEGATIVE
Protein, ur: NEGATIVE mg/dL
Specific Gravity, Urine: 1.014 (ref 1.005–1.030)
pH: 7 (ref 5.0–8.0)

## 2022-02-17 MED ORDER — CEPHALEXIN 500 MG PO CAPS: 500.0000 mg | ORAL_CAPSULE | Freq: Two times a day (BID) | ORAL | 0 refills | Status: DC

## 2022-02-17 MED ORDER — CEPHALEXIN 500 MG PO CAPS
500.0000 mg | ORAL_CAPSULE | Freq: Once | ORAL | Status: AC
  Administered 2022-02-17: 500 mg via ORAL
  Filled 2022-02-17: qty 1

## 2022-02-17 NOTE — ED Provider Notes (Signed)
12:11 AM Reassessed.  Feeling better now.  Has walked int he ED.  Doubt stroke or TIA.  Reassuring workup.  UA notable for 6-10 WBCs and rare bacteria and trace leukocytes.  Will cover with Keflex.   Discussed with son.  Feel that patient is stable for discharge.   Montine Circle, PA-C 02/17/22 0013    Orpah Greek, MD 02/17/22 [phone]

## 2022-02-19 DIAGNOSIS — R2681 Unsteadiness on feet: Secondary | ICD-10-CM | POA: Diagnosis not present

## 2022-02-19 DIAGNOSIS — R2689 Other abnormalities of gait and mobility: Secondary | ICD-10-CM | POA: Diagnosis not present

## 2022-02-19 DIAGNOSIS — M62562 Muscle wasting and atrophy, not elsewhere classified, left lower leg: Secondary | ICD-10-CM | POA: Diagnosis not present

## 2022-02-19 DIAGNOSIS — R278 Other lack of coordination: Secondary | ICD-10-CM | POA: Diagnosis not present

## 2022-02-19 DIAGNOSIS — M62552 Muscle wasting and atrophy, not elsewhere classified, left thigh: Secondary | ICD-10-CM | POA: Diagnosis not present

## 2022-02-19 DIAGNOSIS — M62561 Muscle wasting and atrophy, not elsewhere classified, right lower leg: Secondary | ICD-10-CM | POA: Diagnosis not present

## 2022-02-19 DIAGNOSIS — M62551 Muscle wasting and atrophy, not elsewhere classified, right thigh: Secondary | ICD-10-CM | POA: Diagnosis not present

## 2022-02-21 DIAGNOSIS — D696 Thrombocytopenia, unspecified: Secondary | ICD-10-CM | POA: Diagnosis not present

## 2022-02-21 DIAGNOSIS — M62552 Muscle wasting and atrophy, not elsewhere classified, left thigh: Secondary | ICD-10-CM | POA: Diagnosis not present

## 2022-02-21 DIAGNOSIS — D649 Anemia, unspecified: Secondary | ICD-10-CM | POA: Diagnosis not present

## 2022-02-21 DIAGNOSIS — M62551 Muscle wasting and atrophy, not elsewhere classified, right thigh: Secondary | ICD-10-CM | POA: Diagnosis not present

## 2022-02-21 DIAGNOSIS — R278 Other lack of coordination: Secondary | ICD-10-CM | POA: Diagnosis not present

## 2022-02-21 DIAGNOSIS — R7301 Impaired fasting glucose: Secondary | ICD-10-CM | POA: Diagnosis not present

## 2022-02-21 DIAGNOSIS — Z6824 Body mass index (BMI) 24.0-24.9, adult: Secondary | ICD-10-CM | POA: Diagnosis not present

## 2022-02-21 DIAGNOSIS — R531 Weakness: Secondary | ICD-10-CM | POA: Diagnosis not present

## 2022-02-21 DIAGNOSIS — R339 Retention of urine, unspecified: Secondary | ICD-10-CM | POA: Diagnosis not present

## 2022-02-21 DIAGNOSIS — G629 Polyneuropathy, unspecified: Secondary | ICD-10-CM | POA: Diagnosis not present

## 2022-02-21 DIAGNOSIS — M109 Gout, unspecified: Secondary | ICD-10-CM | POA: Diagnosis not present

## 2022-02-21 DIAGNOSIS — R2689 Other abnormalities of gait and mobility: Secondary | ICD-10-CM | POA: Diagnosis not present

## 2022-02-21 DIAGNOSIS — M62562 Muscle wasting and atrophy, not elsewhere classified, left lower leg: Secondary | ICD-10-CM | POA: Diagnosis not present

## 2022-02-21 DIAGNOSIS — I4891 Unspecified atrial fibrillation: Secondary | ICD-10-CM | POA: Diagnosis not present

## 2022-02-21 DIAGNOSIS — M62561 Muscle wasting and atrophy, not elsewhere classified, right lower leg: Secondary | ICD-10-CM | POA: Diagnosis not present

## 2022-02-21 DIAGNOSIS — R2681 Unsteadiness on feet: Secondary | ICD-10-CM | POA: Diagnosis not present

## 2022-02-21 DIAGNOSIS — I10 Essential (primary) hypertension: Secondary | ICD-10-CM | POA: Diagnosis not present

## 2022-02-22 ENCOUNTER — Ambulatory Visit: Payer: Medicare Other

## 2022-02-22 DIAGNOSIS — H524 Presbyopia: Secondary | ICD-10-CM | POA: Diagnosis not present

## 2022-02-22 DIAGNOSIS — H40023 Open angle with borderline findings, high risk, bilateral: Secondary | ICD-10-CM | POA: Diagnosis not present

## 2022-02-22 DIAGNOSIS — H43813 Vitreous degeneration, bilateral: Secondary | ICD-10-CM | POA: Diagnosis not present

## 2022-02-22 DIAGNOSIS — H26493 Other secondary cataract, bilateral: Secondary | ICD-10-CM | POA: Diagnosis not present

## 2022-02-23 DIAGNOSIS — M62562 Muscle wasting and atrophy, not elsewhere classified, left lower leg: Secondary | ICD-10-CM | POA: Diagnosis not present

## 2022-02-23 DIAGNOSIS — M62552 Muscle wasting and atrophy, not elsewhere classified, left thigh: Secondary | ICD-10-CM | POA: Diagnosis not present

## 2022-02-23 DIAGNOSIS — R2689 Other abnormalities of gait and mobility: Secondary | ICD-10-CM | POA: Diagnosis not present

## 2022-02-23 DIAGNOSIS — M62551 Muscle wasting and atrophy, not elsewhere classified, right thigh: Secondary | ICD-10-CM | POA: Diagnosis not present

## 2022-02-23 DIAGNOSIS — M62561 Muscle wasting and atrophy, not elsewhere classified, right lower leg: Secondary | ICD-10-CM | POA: Diagnosis not present

## 2022-02-26 DIAGNOSIS — M62551 Muscle wasting and atrophy, not elsewhere classified, right thigh: Secondary | ICD-10-CM | POA: Diagnosis not present

## 2022-02-26 DIAGNOSIS — R2689 Other abnormalities of gait and mobility: Secondary | ICD-10-CM | POA: Diagnosis not present

## 2022-02-26 DIAGNOSIS — R2681 Unsteadiness on feet: Secondary | ICD-10-CM | POA: Diagnosis not present

## 2022-02-26 DIAGNOSIS — M62562 Muscle wasting and atrophy, not elsewhere classified, left lower leg: Secondary | ICD-10-CM | POA: Diagnosis not present

## 2022-02-26 DIAGNOSIS — M62552 Muscle wasting and atrophy, not elsewhere classified, left thigh: Secondary | ICD-10-CM | POA: Diagnosis not present

## 2022-02-26 DIAGNOSIS — M62561 Muscle wasting and atrophy, not elsewhere classified, right lower leg: Secondary | ICD-10-CM | POA: Diagnosis not present

## 2022-02-26 DIAGNOSIS — R278 Other lack of coordination: Secondary | ICD-10-CM | POA: Diagnosis not present

## 2022-02-27 ENCOUNTER — Telehealth: Payer: Self-pay | Admitting: Cardiology

## 2022-02-27 NOTE — Telephone Encounter (Signed)
OK to use - neuropathy is not a common side effect seen with amlodipine.

## 2022-02-27 NOTE — Telephone Encounter (Signed)
Patient has concerns that amlodipine can aggravate neuropathy. Patient already has neuropathy and wants to make sure that amlodipine isn't worsening it.

## 2022-02-27 NOTE — Telephone Encounter (Signed)
Pt c/o medication issue:  1. Name of Medication:  Amlodipine  2. How are you currently taking this medication (dosage and times per day)?   3. Are you having a reaction (difficulty breathing--STAT)?   4. What is your medication issue?   Patient is requesting to speak directly with [name] or Andee Poles if possible. He states someone advised him that Amlodipine may have an adverse effect on neuropathy and he would like to discuss.

## 2022-02-27 NOTE — Telephone Encounter (Signed)
Spoke with the patient and gave advice from pharmD. Patient verbalized understanding.

## 2022-02-28 DIAGNOSIS — R2689 Other abnormalities of gait and mobility: Secondary | ICD-10-CM | POA: Diagnosis not present

## 2022-02-28 DIAGNOSIS — R278 Other lack of coordination: Secondary | ICD-10-CM | POA: Diagnosis not present

## 2022-02-28 DIAGNOSIS — M62562 Muscle wasting and atrophy, not elsewhere classified, left lower leg: Secondary | ICD-10-CM | POA: Diagnosis not present

## 2022-02-28 DIAGNOSIS — R2681 Unsteadiness on feet: Secondary | ICD-10-CM | POA: Diagnosis not present

## 2022-02-28 DIAGNOSIS — M62561 Muscle wasting and atrophy, not elsewhere classified, right lower leg: Secondary | ICD-10-CM | POA: Diagnosis not present

## 2022-02-28 DIAGNOSIS — M62551 Muscle wasting and atrophy, not elsewhere classified, right thigh: Secondary | ICD-10-CM | POA: Diagnosis not present

## 2022-02-28 DIAGNOSIS — M62552 Muscle wasting and atrophy, not elsewhere classified, left thigh: Secondary | ICD-10-CM | POA: Diagnosis not present

## 2022-03-01 ENCOUNTER — Ambulatory Visit: Payer: Medicare Other | Attending: Cardiology | Admitting: *Deleted

## 2022-03-01 DIAGNOSIS — I4891 Unspecified atrial fibrillation: Secondary | ICD-10-CM

## 2022-03-01 DIAGNOSIS — Z9889 Other specified postprocedural states: Secondary | ICD-10-CM | POA: Diagnosis not present

## 2022-03-01 DIAGNOSIS — Z7901 Long term (current) use of anticoagulants: Secondary | ICD-10-CM

## 2022-03-01 DIAGNOSIS — Z5181 Encounter for therapeutic drug level monitoring: Secondary | ICD-10-CM

## 2022-03-01 DIAGNOSIS — I639 Cerebral infarction, unspecified: Secondary | ICD-10-CM

## 2022-03-01 LAB — POCT INR: INR: 1.6 — AB (ref 2.0–3.0)

## 2022-03-01 NOTE — Patient Instructions (Signed)
Description   Since you missed your dose earlier this week, take an extra tablet today and then CONTINUE taking 1 tablet daily except for 1.5 tablets on Mondays. Stay consistent with greens each week. Recheck INR in 3 weeks. Coumadin Clinic [phone]

## 2022-03-02 DIAGNOSIS — M62551 Muscle wasting and atrophy, not elsewhere classified, right thigh: Secondary | ICD-10-CM | POA: Diagnosis not present

## 2022-03-02 DIAGNOSIS — M62561 Muscle wasting and atrophy, not elsewhere classified, right lower leg: Secondary | ICD-10-CM | POA: Diagnosis not present

## 2022-03-02 DIAGNOSIS — M62552 Muscle wasting and atrophy, not elsewhere classified, left thigh: Secondary | ICD-10-CM | POA: Diagnosis not present

## 2022-03-02 DIAGNOSIS — R2689 Other abnormalities of gait and mobility: Secondary | ICD-10-CM | POA: Diagnosis not present

## 2022-03-02 DIAGNOSIS — M62562 Muscle wasting and atrophy, not elsewhere classified, left lower leg: Secondary | ICD-10-CM | POA: Diagnosis not present

## 2022-03-05 DIAGNOSIS — M62552 Muscle wasting and atrophy, not elsewhere classified, left thigh: Secondary | ICD-10-CM | POA: Diagnosis not present

## 2022-03-05 DIAGNOSIS — M62562 Muscle wasting and atrophy, not elsewhere classified, left lower leg: Secondary | ICD-10-CM | POA: Diagnosis not present

## 2022-03-05 DIAGNOSIS — M62551 Muscle wasting and atrophy, not elsewhere classified, right thigh: Secondary | ICD-10-CM | POA: Diagnosis not present

## 2022-03-05 DIAGNOSIS — R2689 Other abnormalities of gait and mobility: Secondary | ICD-10-CM | POA: Diagnosis not present

## 2022-03-05 DIAGNOSIS — M62561 Muscle wasting and atrophy, not elsewhere classified, right lower leg: Secondary | ICD-10-CM | POA: Diagnosis not present

## 2022-03-07 DIAGNOSIS — R2681 Unsteadiness on feet: Secondary | ICD-10-CM | POA: Diagnosis not present

## 2022-03-07 DIAGNOSIS — M62552 Muscle wasting and atrophy, not elsewhere classified, left thigh: Secondary | ICD-10-CM | POA: Diagnosis not present

## 2022-03-07 DIAGNOSIS — M62551 Muscle wasting and atrophy, not elsewhere classified, right thigh: Secondary | ICD-10-CM | POA: Diagnosis not present

## 2022-03-07 DIAGNOSIS — R278 Other lack of coordination: Secondary | ICD-10-CM | POA: Diagnosis not present

## 2022-03-07 DIAGNOSIS — M62562 Muscle wasting and atrophy, not elsewhere classified, left lower leg: Secondary | ICD-10-CM | POA: Diagnosis not present

## 2022-03-07 DIAGNOSIS — M62561 Muscle wasting and atrophy, not elsewhere classified, right lower leg: Secondary | ICD-10-CM | POA: Diagnosis not present

## 2022-03-07 DIAGNOSIS — R2689 Other abnormalities of gait and mobility: Secondary | ICD-10-CM | POA: Diagnosis not present

## 2022-03-12 DIAGNOSIS — M62561 Muscle wasting and atrophy, not elsewhere classified, right lower leg: Secondary | ICD-10-CM | POA: Diagnosis not present

## 2022-03-12 DIAGNOSIS — M62552 Muscle wasting and atrophy, not elsewhere classified, left thigh: Secondary | ICD-10-CM | POA: Diagnosis not present

## 2022-03-12 DIAGNOSIS — R2681 Unsteadiness on feet: Secondary | ICD-10-CM | POA: Diagnosis not present

## 2022-03-12 DIAGNOSIS — R2689 Other abnormalities of gait and mobility: Secondary | ICD-10-CM | POA: Diagnosis not present

## 2022-03-12 DIAGNOSIS — M62562 Muscle wasting and atrophy, not elsewhere classified, left lower leg: Secondary | ICD-10-CM | POA: Diagnosis not present

## 2022-03-12 DIAGNOSIS — M62551 Muscle wasting and atrophy, not elsewhere classified, right thigh: Secondary | ICD-10-CM | POA: Diagnosis not present

## 2022-03-13 DIAGNOSIS — R2681 Unsteadiness on feet: Secondary | ICD-10-CM | POA: Diagnosis not present

## 2022-03-13 DIAGNOSIS — R278 Other lack of coordination: Secondary | ICD-10-CM | POA: Diagnosis not present

## 2022-03-14 DIAGNOSIS — M62562 Muscle wasting and atrophy, not elsewhere classified, left lower leg: Secondary | ICD-10-CM | POA: Diagnosis not present

## 2022-03-14 DIAGNOSIS — R2689 Other abnormalities of gait and mobility: Secondary | ICD-10-CM | POA: Diagnosis not present

## 2022-03-14 DIAGNOSIS — M62561 Muscle wasting and atrophy, not elsewhere classified, right lower leg: Secondary | ICD-10-CM | POA: Diagnosis not present

## 2022-03-14 DIAGNOSIS — R2681 Unsteadiness on feet: Secondary | ICD-10-CM | POA: Diagnosis not present

## 2022-03-14 DIAGNOSIS — M62552 Muscle wasting and atrophy, not elsewhere classified, left thigh: Secondary | ICD-10-CM | POA: Diagnosis not present

## 2022-03-14 DIAGNOSIS — M62551 Muscle wasting and atrophy, not elsewhere classified, right thigh: Secondary | ICD-10-CM | POA: Diagnosis not present

## 2022-03-16 DIAGNOSIS — M62551 Muscle wasting and atrophy, not elsewhere classified, right thigh: Secondary | ICD-10-CM | POA: Diagnosis not present

## 2022-03-16 DIAGNOSIS — M62561 Muscle wasting and atrophy, not elsewhere classified, right lower leg: Secondary | ICD-10-CM | POA: Diagnosis not present

## 2022-03-16 DIAGNOSIS — M62562 Muscle wasting and atrophy, not elsewhere classified, left lower leg: Secondary | ICD-10-CM | POA: Diagnosis not present

## 2022-03-16 DIAGNOSIS — R2689 Other abnormalities of gait and mobility: Secondary | ICD-10-CM | POA: Diagnosis not present

## 2022-03-16 DIAGNOSIS — M62552 Muscle wasting and atrophy, not elsewhere classified, left thigh: Secondary | ICD-10-CM | POA: Diagnosis not present

## 2022-03-19 ENCOUNTER — Ambulatory Visit (HOSPITAL_COMMUNITY): Payer: Medicare Other | Attending: Physician Assistant

## 2022-03-19 DIAGNOSIS — I371 Nonrheumatic pulmonary valve insufficiency: Secondary | ICD-10-CM

## 2022-03-19 DIAGNOSIS — M62562 Muscle wasting and atrophy, not elsewhere classified, left lower leg: Secondary | ICD-10-CM | POA: Diagnosis not present

## 2022-03-19 DIAGNOSIS — I77819 Aortic ectasia, unspecified site: Secondary | ICD-10-CM | POA: Insufficient documentation

## 2022-03-19 DIAGNOSIS — I088 Other rheumatic multiple valve diseases: Secondary | ICD-10-CM | POA: Diagnosis not present

## 2022-03-19 DIAGNOSIS — M62561 Muscle wasting and atrophy, not elsewhere classified, right lower leg: Secondary | ICD-10-CM | POA: Diagnosis not present

## 2022-03-19 DIAGNOSIS — I10 Essential (primary) hypertension: Secondary | ICD-10-CM | POA: Diagnosis not present

## 2022-03-19 DIAGNOSIS — Z9889 Other specified postprocedural states: Secondary | ICD-10-CM

## 2022-03-19 DIAGNOSIS — Z952 Presence of prosthetic heart valve: Secondary | ICD-10-CM | POA: Diagnosis not present

## 2022-03-19 DIAGNOSIS — M62551 Muscle wasting and atrophy, not elsewhere classified, right thigh: Secondary | ICD-10-CM | POA: Diagnosis not present

## 2022-03-19 DIAGNOSIS — R2689 Other abnormalities of gait and mobility: Secondary | ICD-10-CM | POA: Diagnosis not present

## 2022-03-19 DIAGNOSIS — M62552 Muscle wasting and atrophy, not elsewhere classified, left thigh: Secondary | ICD-10-CM | POA: Diagnosis not present

## 2022-03-19 LAB — ECHOCARDIOGRAM COMPLETE
AV Mean grad: 15.4 mmHg
AV Peak grad: 27 mmHg
Ao pk vel: 2.6 m/s
Area-P 1/2: 2.11 cm2
S' Lateral: 2.7 cm

## 2022-03-20 ENCOUNTER — Encounter: Payer: Self-pay | Admitting: Physician Assistant

## 2022-03-20 DIAGNOSIS — R2681 Unsteadiness on feet: Secondary | ICD-10-CM | POA: Diagnosis not present

## 2022-03-20 DIAGNOSIS — I2729 Other secondary pulmonary hypertension: Secondary | ICD-10-CM

## 2022-03-20 DIAGNOSIS — R278 Other lack of coordination: Secondary | ICD-10-CM | POA: Diagnosis not present

## 2022-03-20 HISTORY — DX: Other secondary pulmonary hypertension: I27.29

## 2022-03-21 DIAGNOSIS — R2681 Unsteadiness on feet: Secondary | ICD-10-CM | POA: Diagnosis not present

## 2022-03-21 DIAGNOSIS — M62551 Muscle wasting and atrophy, not elsewhere classified, right thigh: Secondary | ICD-10-CM | POA: Diagnosis not present

## 2022-03-21 DIAGNOSIS — R2689 Other abnormalities of gait and mobility: Secondary | ICD-10-CM | POA: Diagnosis not present

## 2022-03-21 DIAGNOSIS — M62561 Muscle wasting and atrophy, not elsewhere classified, right lower leg: Secondary | ICD-10-CM | POA: Diagnosis not present

## 2022-03-21 DIAGNOSIS — M62552 Muscle wasting and atrophy, not elsewhere classified, left thigh: Secondary | ICD-10-CM | POA: Diagnosis not present

## 2022-03-21 DIAGNOSIS — R278 Other lack of coordination: Secondary | ICD-10-CM | POA: Diagnosis not present

## 2022-03-21 DIAGNOSIS — M62562 Muscle wasting and atrophy, not elsewhere classified, left lower leg: Secondary | ICD-10-CM | POA: Diagnosis not present

## 2022-03-22 DIAGNOSIS — L578 Other skin changes due to chronic exposure to nonionizing radiation: Secondary | ICD-10-CM | POA: Diagnosis not present

## 2022-03-22 DIAGNOSIS — D1801 Hemangioma of skin and subcutaneous tissue: Secondary | ICD-10-CM | POA: Diagnosis not present

## 2022-03-22 DIAGNOSIS — Z85828 Personal history of other malignant neoplasm of skin: Secondary | ICD-10-CM | POA: Diagnosis not present

## 2022-03-22 DIAGNOSIS — L821 Other seborrheic keratosis: Secondary | ICD-10-CM | POA: Diagnosis not present

## 2022-03-22 DIAGNOSIS — D229 Melanocytic nevi, unspecified: Secondary | ICD-10-CM | POA: Diagnosis not present

## 2022-03-22 DIAGNOSIS — L814 Other melanin hyperpigmentation: Secondary | ICD-10-CM | POA: Diagnosis not present

## 2022-03-22 DIAGNOSIS — L570 Actinic keratosis: Secondary | ICD-10-CM | POA: Diagnosis not present

## 2022-03-22 DIAGNOSIS — R202 Paresthesia of skin: Secondary | ICD-10-CM | POA: Diagnosis not present

## 2022-03-23 ENCOUNTER — Ambulatory Visit: Payer: Medicare Other | Attending: Cardiovascular Disease | Admitting: *Deleted

## 2022-03-23 ENCOUNTER — Telehealth: Payer: Self-pay | Admitting: Physician Assistant

## 2022-03-23 DIAGNOSIS — Z5181 Encounter for therapeutic drug level monitoring: Secondary | ICD-10-CM | POA: Diagnosis not present

## 2022-03-23 DIAGNOSIS — Z9889 Other specified postprocedural states: Secondary | ICD-10-CM

## 2022-03-23 DIAGNOSIS — M62552 Muscle wasting and atrophy, not elsewhere classified, left thigh: Secondary | ICD-10-CM | POA: Diagnosis not present

## 2022-03-23 DIAGNOSIS — I639 Cerebral infarction, unspecified: Secondary | ICD-10-CM | POA: Diagnosis not present

## 2022-03-23 DIAGNOSIS — M62562 Muscle wasting and atrophy, not elsewhere classified, left lower leg: Secondary | ICD-10-CM | POA: Diagnosis not present

## 2022-03-23 DIAGNOSIS — M62561 Muscle wasting and atrophy, not elsewhere classified, right lower leg: Secondary | ICD-10-CM | POA: Diagnosis not present

## 2022-03-23 DIAGNOSIS — Z7901 Long term (current) use of anticoagulants: Secondary | ICD-10-CM

## 2022-03-23 DIAGNOSIS — R2689 Other abnormalities of gait and mobility: Secondary | ICD-10-CM | POA: Diagnosis not present

## 2022-03-23 DIAGNOSIS — M62551 Muscle wasting and atrophy, not elsewhere classified, right thigh: Secondary | ICD-10-CM | POA: Diagnosis not present

## 2022-03-23 DIAGNOSIS — I4891 Unspecified atrial fibrillation: Secondary | ICD-10-CM | POA: Diagnosis not present

## 2022-03-23 LAB — POCT INR: INR: 1.5 — AB (ref 2.0–3.0)

## 2022-03-23 NOTE — Telephone Encounter (Signed)
Received note from patient and called to follow up. Left message for him to call back.  It looks like he is concerned about neuropathy and questioning whether he should come off of amlodipine. This would certainly be a rare cause.   He is followed by neurology already and I would recommend he discuss his neuropathy further with his neurologist as they can answer specifics about neuropathy better than me.  [name] is listed on his note. This is an acute condition that occurs over 2-4 weeks. So I do not think this is of concern.  He was previously on Lisinopril and this was stopped when he had his stroke to allow his BP to run higher as he recovered.   PLAN: If he is interested in trying to come off of Amlodipine, we can try to stop it. He will likely need to resume Lisinopril and/or add another medication like Hydralazine to keep his BP controlled.   His BP has been well controlled on Amlodipine and since it is an unlikely cause of neuropathy, I favor staying on it.   However, if he wants to stop the amlodipine, he can stop it and start on Lisinopril 10 mg once daily. Check a BMET 2 weeks after starting on Lisinopril.  [name], PA-C    03/23/2022 1:31 PM

## 2022-03-23 NOTE — Patient Instructions (Signed)
Description   Since you missed your dose earlier this week, take an extra tablet (1.5 tablets) today and 1.5 tablets tomorrow then CONTINUE taking 1 tablet daily except for 1.5 tablets on Mondays. Stay consistent with greens each week. Recheck INR in 2 weeks. Coumadin Clinic [phone]

## 2022-03-26 ENCOUNTER — Telehealth: Payer: Self-pay | Admitting: *Deleted

## 2022-03-26 DIAGNOSIS — M62552 Muscle wasting and atrophy, not elsewhere classified, left thigh: Secondary | ICD-10-CM | POA: Diagnosis not present

## 2022-03-26 DIAGNOSIS — M62562 Muscle wasting and atrophy, not elsewhere classified, left lower leg: Secondary | ICD-10-CM | POA: Diagnosis not present

## 2022-03-26 DIAGNOSIS — M62561 Muscle wasting and atrophy, not elsewhere classified, right lower leg: Secondary | ICD-10-CM | POA: Diagnosis not present

## 2022-03-26 DIAGNOSIS — I38 Endocarditis, valve unspecified: Secondary | ICD-10-CM

## 2022-03-26 DIAGNOSIS — R2689 Other abnormalities of gait and mobility: Secondary | ICD-10-CM | POA: Diagnosis not present

## 2022-03-26 DIAGNOSIS — M62551 Muscle wasting and atrophy, not elsewhere classified, right thigh: Secondary | ICD-10-CM | POA: Diagnosis not present

## 2022-03-26 NOTE — Telephone Encounter (Signed)
Pt has been made aware of Scott's reply / recommendations to his letter of concern.  Per pt, leave his medications the way they are and he will discuss with his Neurologists regarding Neuropathy.  Pt was very thankful for the information.

## 2022-03-26 NOTE — Telephone Encounter (Signed)
-----   Message from Liliane Shi, [place] sent at 03/24/2022  7:24 AM EDT ----- Patient did not view MyChart comments.  Please notify patient of results. [name], PA-C    03/24/2022 7:24 AM

## 2022-03-27 DIAGNOSIS — R2681 Unsteadiness on feet: Secondary | ICD-10-CM | POA: Diagnosis not present

## 2022-03-27 DIAGNOSIS — R278 Other lack of coordination: Secondary | ICD-10-CM | POA: Diagnosis not present

## 2022-03-28 DIAGNOSIS — M62561 Muscle wasting and atrophy, not elsewhere classified, right lower leg: Secondary | ICD-10-CM | POA: Diagnosis not present

## 2022-03-28 DIAGNOSIS — M62551 Muscle wasting and atrophy, not elsewhere classified, right thigh: Secondary | ICD-10-CM | POA: Diagnosis not present

## 2022-03-28 DIAGNOSIS — R2689 Other abnormalities of gait and mobility: Secondary | ICD-10-CM | POA: Diagnosis not present

## 2022-03-28 DIAGNOSIS — M62552 Muscle wasting and atrophy, not elsewhere classified, left thigh: Secondary | ICD-10-CM | POA: Diagnosis not present

## 2022-03-28 DIAGNOSIS — M62562 Muscle wasting and atrophy, not elsewhere classified, left lower leg: Secondary | ICD-10-CM | POA: Diagnosis not present

## 2022-03-29 ENCOUNTER — Other Ambulatory Visit: Payer: Self-pay | Admitting: Adult Health

## 2022-03-30 DIAGNOSIS — R2681 Unsteadiness on feet: Secondary | ICD-10-CM | POA: Diagnosis not present

## 2022-03-30 DIAGNOSIS — M62552 Muscle wasting and atrophy, not elsewhere classified, left thigh: Secondary | ICD-10-CM | POA: Diagnosis not present

## 2022-03-30 DIAGNOSIS — M62561 Muscle wasting and atrophy, not elsewhere classified, right lower leg: Secondary | ICD-10-CM | POA: Diagnosis not present

## 2022-03-30 DIAGNOSIS — R278 Other lack of coordination: Secondary | ICD-10-CM | POA: Diagnosis not present

## 2022-03-30 DIAGNOSIS — M62562 Muscle wasting and atrophy, not elsewhere classified, left lower leg: Secondary | ICD-10-CM | POA: Diagnosis not present

## 2022-03-30 DIAGNOSIS — M62551 Muscle wasting and atrophy, not elsewhere classified, right thigh: Secondary | ICD-10-CM | POA: Diagnosis not present

## 2022-03-30 DIAGNOSIS — R2689 Other abnormalities of gait and mobility: Secondary | ICD-10-CM | POA: Diagnosis not present

## 2022-04-02 DIAGNOSIS — M62551 Muscle wasting and atrophy, not elsewhere classified, right thigh: Secondary | ICD-10-CM | POA: Diagnosis not present

## 2022-04-02 DIAGNOSIS — M62561 Muscle wasting and atrophy, not elsewhere classified, right lower leg: Secondary | ICD-10-CM | POA: Diagnosis not present

## 2022-04-02 DIAGNOSIS — R2689 Other abnormalities of gait and mobility: Secondary | ICD-10-CM | POA: Diagnosis not present

## 2022-04-02 DIAGNOSIS — M62562 Muscle wasting and atrophy, not elsewhere classified, left lower leg: Secondary | ICD-10-CM | POA: Diagnosis not present

## 2022-04-02 DIAGNOSIS — M62552 Muscle wasting and atrophy, not elsewhere classified, left thigh: Secondary | ICD-10-CM | POA: Diagnosis not present

## 2022-04-03 DIAGNOSIS — R2681 Unsteadiness on feet: Secondary | ICD-10-CM | POA: Diagnosis not present

## 2022-04-03 DIAGNOSIS — R278 Other lack of coordination: Secondary | ICD-10-CM | POA: Diagnosis not present

## 2022-04-04 DIAGNOSIS — M62561 Muscle wasting and atrophy, not elsewhere classified, right lower leg: Secondary | ICD-10-CM | POA: Diagnosis not present

## 2022-04-04 DIAGNOSIS — M62551 Muscle wasting and atrophy, not elsewhere classified, right thigh: Secondary | ICD-10-CM | POA: Diagnosis not present

## 2022-04-04 DIAGNOSIS — M62562 Muscle wasting and atrophy, not elsewhere classified, left lower leg: Secondary | ICD-10-CM | POA: Diagnosis not present

## 2022-04-04 DIAGNOSIS — M62552 Muscle wasting and atrophy, not elsewhere classified, left thigh: Secondary | ICD-10-CM | POA: Diagnosis not present

## 2022-04-04 DIAGNOSIS — R2689 Other abnormalities of gait and mobility: Secondary | ICD-10-CM | POA: Diagnosis not present

## 2022-04-06 ENCOUNTER — Ambulatory Visit: Payer: Medicare Other | Attending: Cardiovascular Disease | Admitting: *Deleted

## 2022-04-06 DIAGNOSIS — Z5181 Encounter for therapeutic drug level monitoring: Secondary | ICD-10-CM | POA: Diagnosis not present

## 2022-04-06 DIAGNOSIS — I4891 Unspecified atrial fibrillation: Secondary | ICD-10-CM

## 2022-04-06 DIAGNOSIS — I639 Cerebral infarction, unspecified: Secondary | ICD-10-CM

## 2022-04-06 DIAGNOSIS — M62562 Muscle wasting and atrophy, not elsewhere classified, left lower leg: Secondary | ICD-10-CM | POA: Diagnosis not present

## 2022-04-06 DIAGNOSIS — R278 Other lack of coordination: Secondary | ICD-10-CM | POA: Diagnosis not present

## 2022-04-06 DIAGNOSIS — R2681 Unsteadiness on feet: Secondary | ICD-10-CM | POA: Diagnosis not present

## 2022-04-06 DIAGNOSIS — M62551 Muscle wasting and atrophy, not elsewhere classified, right thigh: Secondary | ICD-10-CM | POA: Diagnosis not present

## 2022-04-06 DIAGNOSIS — M62561 Muscle wasting and atrophy, not elsewhere classified, right lower leg: Secondary | ICD-10-CM | POA: Diagnosis not present

## 2022-04-06 DIAGNOSIS — M62552 Muscle wasting and atrophy, not elsewhere classified, left thigh: Secondary | ICD-10-CM | POA: Diagnosis not present

## 2022-04-06 DIAGNOSIS — R2689 Other abnormalities of gait and mobility: Secondary | ICD-10-CM | POA: Diagnosis not present

## 2022-04-06 LAB — POCT INR: POC INR: 1.9

## 2022-04-06 NOTE — Patient Instructions (Signed)
Description   Take 1.5 tablets of warfarin today Then START taking 1 tablet daily except for 1.5 tablets on Mondays and Thursdays. Stay consistent with greens each week. Recheck INR in 2 weeks. Coumadin Clinic [phone]

## 2022-04-09 DIAGNOSIS — R2689 Other abnormalities of gait and mobility: Secondary | ICD-10-CM | POA: Diagnosis not present

## 2022-04-09 DIAGNOSIS — M62552 Muscle wasting and atrophy, not elsewhere classified, left thigh: Secondary | ICD-10-CM | POA: Diagnosis not present

## 2022-04-09 DIAGNOSIS — M62562 Muscle wasting and atrophy, not elsewhere classified, left lower leg: Secondary | ICD-10-CM | POA: Diagnosis not present

## 2022-04-09 DIAGNOSIS — R2681 Unsteadiness on feet: Secondary | ICD-10-CM | POA: Diagnosis not present

## 2022-04-09 DIAGNOSIS — M62551 Muscle wasting and atrophy, not elsewhere classified, right thigh: Secondary | ICD-10-CM | POA: Diagnosis not present

## 2022-04-09 DIAGNOSIS — M62561 Muscle wasting and atrophy, not elsewhere classified, right lower leg: Secondary | ICD-10-CM | POA: Diagnosis not present

## 2022-04-10 DIAGNOSIS — R2681 Unsteadiness on feet: Secondary | ICD-10-CM | POA: Diagnosis not present

## 2022-04-10 DIAGNOSIS — R278 Other lack of coordination: Secondary | ICD-10-CM | POA: Diagnosis not present

## 2022-04-10 NOTE — Progress Notes (Deleted)
Cardiology Office Note:    Date:  04/10/2022  ID:  [name], DOB 1935/02/02, MRN [id number] [facility] Cardiologist:  Candee Furbish, MD Cardiology APP:  Liliane Shi, PA-C { Click to update primary MD,subspecialty MD or APP then REFRESH:1}     Patient Profile:   Aortic insufficiency S/p bioprosthetic AVR and Patent Foramen Ovale closure in 2012 TTE 3/23: EF 60-65, no RWMA, mild LVH, NL RVSF, RVSP 50.8 TTE 03/19/2022: EF 65-70, no RWMA, moderate LVH, normal RVSF, severely elevated PASP, severe BAE, trivial MR, mean MV gradient 9 (normal structure and function of MV repair), TV repair with mean gradient 1.72, AVR with trivial AI (normal structure and function, mean gradient 15.4, V-max 2.6 m/s), moderate PI, mild dilation of aortic root and ascending aorta (40 mm), RAP 15 Severe MR, severe TR S/p MV Repair, TV Repair, MAZE, LA clipping in 01/2018 Permanent atrial fibrillation/flutter Chronic anticoagulation w Warfarin Monitor 04/14/20: 151 SV runs; Mobitz 1, PACs 6.4%, rare PVCs, poss AF/Flutter Pulmonary hypertension  Mod Pulmonic Insufficiency  Hx of R MCA CVA tx with cath based Rx Acute/subacute L frontal CVA 05/2021 Coronary artery disease Non-obstructive by cath in 10/19 (pLCx 30, os LAD 30, pLAD 40) Carotid artery stenosis [place] 01/20/18: L 1-39 Aortic atherosclerosis  Dilated thoracic aorta Echocardiogram 3/22:  root 43 mm, ascending aorta 41 mm Hypertension Atenolol DCd in 3/22 due to ? HR but resumed due to uncontrolled BP Hx of GI bleed Borderline diabetes mellitus BPH Peripheral neuropathy L hip fracture due to mechanical fall 7/22 >> s/p L THR Low albumin S/p L TKR 09/2021     History of Present Illness:   [name]. is a 87 y.o. male who returns for f/u on atrial fibrillation, valvular heart disease. He was last seen 11/29/21. A f/u echocardiogram in 03/2022 showed normal EF, normally functioning AVR, MV and TV repairs.  There was  severe pulmonary hypertension and moderate pulmonic insufficiency.  I reviewed this with Dr. Marlou Porch of Dr[facility].  We agreed no further testing was needed aside from a follow-up echo in 1 year. ***  ROS ***    Studies Reviewed:    EKG:  ***  ***  Risk Assessment/Calculations:   {Does this patient have ATRIAL FIBRILLATION?[phone]} No BP recorded.  {Refresh Note OR Click here to enter BP  :1}***       Physical Exam:   VS:  There were no vitals taken for this visit.   Wt Readings from Last 3 Encounters:  02/16/22 177 lb (80.3 kg)  02/16/22 177 lb 6.4 oz (80.5 kg)  11/29/21 168 lb (76.2 kg)    Physical Exam***    ASSESSMENT AND PLAN:   No problem-specific Assessment & Plan notes found for this encounter. ***{   Valvular heart disease History of bioprosthetic AVR in 2012 and mitral valve repair, tricuspid valve repair in 2020.  Recent echocardiogram with stable AVR, mitral valve repair and tricuspid valve repair.  Continue SBE prophylaxis. F/u echocardiogram due in March 2024.  Permanent atrial fibrillation (HCC) Heart rate is well controlled.  He is tolerating anticoagulation.  Coumadin is managed by our Coumadin clinic.  INR on 11/23/21 was 2.3.  Continue Coumadin as directed, atenolol 12.5 mg daily.  Essential hypertension Blood pressure is well controlled.  Continue amlodipine 10 mg daily, atenolol 12.5 mg daily.  Acquired dilation of ascending aorta and aortic root (HCC) Ascending aorta 36 mm and aortic root 41 mm by echocardiogram in March  2023.  Plan follow-up echocardiogram in March 2024.  CAD (coronary artery disease) Nonobstructive CAD by cardiac catheterization in 2019.  He is not having anginal symptoms.  He is not on aspirin as he is on warfarin.  Continue rosuvastatin 10 mg daily.  HLD (hyperlipidemia) LDL optimal in May 2023. Continue Crestor 10 mg once daily.    :1}     {Are you ordering a CV Procedure (e.g. stress test, cath, DCCV, TEE, etc)?   Press F2         :[id number]  Dispo:  No follow-ups on file. Signed, [name], PA-C

## 2022-04-11 ENCOUNTER — Ambulatory Visit: Payer: Medicare Other | Admitting: Physician Assistant

## 2022-04-11 DIAGNOSIS — M62562 Muscle wasting and atrophy, not elsewhere classified, left lower leg: Secondary | ICD-10-CM | POA: Diagnosis not present

## 2022-04-11 DIAGNOSIS — I2729 Other secondary pulmonary hypertension: Secondary | ICD-10-CM

## 2022-04-11 DIAGNOSIS — I38 Endocarditis, valve unspecified: Secondary | ICD-10-CM

## 2022-04-11 DIAGNOSIS — M62552 Muscle wasting and atrophy, not elsewhere classified, left thigh: Secondary | ICD-10-CM | POA: Diagnosis not present

## 2022-04-11 DIAGNOSIS — I4821 Permanent atrial fibrillation: Secondary | ICD-10-CM

## 2022-04-11 DIAGNOSIS — R2689 Other abnormalities of gait and mobility: Secondary | ICD-10-CM | POA: Diagnosis not present

## 2022-04-11 DIAGNOSIS — M62551 Muscle wasting and atrophy, not elsewhere classified, right thigh: Secondary | ICD-10-CM | POA: Diagnosis not present

## 2022-04-11 DIAGNOSIS — M62561 Muscle wasting and atrophy, not elsewhere classified, right lower leg: Secondary | ICD-10-CM | POA: Diagnosis not present

## 2022-04-12 DIAGNOSIS — R2689 Other abnormalities of gait and mobility: Secondary | ICD-10-CM | POA: Diagnosis not present

## 2022-04-12 DIAGNOSIS — R278 Other lack of coordination: Secondary | ICD-10-CM | POA: Diagnosis not present

## 2022-04-12 DIAGNOSIS — R2681 Unsteadiness on feet: Secondary | ICD-10-CM | POA: Diagnosis not present

## 2022-04-12 DIAGNOSIS — M62561 Muscle wasting and atrophy, not elsewhere classified, right lower leg: Secondary | ICD-10-CM | POA: Diagnosis not present

## 2022-04-12 DIAGNOSIS — M62562 Muscle wasting and atrophy, not elsewhere classified, left lower leg: Secondary | ICD-10-CM | POA: Diagnosis not present

## 2022-04-12 DIAGNOSIS — M62552 Muscle wasting and atrophy, not elsewhere classified, left thigh: Secondary | ICD-10-CM | POA: Diagnosis not present

## 2022-04-12 DIAGNOSIS — M62551 Muscle wasting and atrophy, not elsewhere classified, right thigh: Secondary | ICD-10-CM | POA: Diagnosis not present

## 2022-04-13 DIAGNOSIS — R2681 Unsteadiness on feet: Secondary | ICD-10-CM | POA: Diagnosis not present

## 2022-04-13 DIAGNOSIS — R278 Other lack of coordination: Secondary | ICD-10-CM | POA: Diagnosis not present

## 2022-04-14 DIAGNOSIS — M62551 Muscle wasting and atrophy, not elsewhere classified, right thigh: Secondary | ICD-10-CM | POA: Diagnosis not present

## 2022-04-14 DIAGNOSIS — R2689 Other abnormalities of gait and mobility: Secondary | ICD-10-CM | POA: Diagnosis not present

## 2022-04-14 DIAGNOSIS — M62552 Muscle wasting and atrophy, not elsewhere classified, left thigh: Secondary | ICD-10-CM | POA: Diagnosis not present

## 2022-04-14 DIAGNOSIS — M62561 Muscle wasting and atrophy, not elsewhere classified, right lower leg: Secondary | ICD-10-CM | POA: Diagnosis not present

## 2022-04-14 DIAGNOSIS — M62562 Muscle wasting and atrophy, not elsewhere classified, left lower leg: Secondary | ICD-10-CM | POA: Diagnosis not present

## 2022-04-16 DIAGNOSIS — M62561 Muscle wasting and atrophy, not elsewhere classified, right lower leg: Secondary | ICD-10-CM | POA: Diagnosis not present

## 2022-04-16 DIAGNOSIS — Z6824 Body mass index (BMI) 24.0-24.9, adult: Secondary | ICD-10-CM | POA: Diagnosis not present

## 2022-04-16 DIAGNOSIS — M62562 Muscle wasting and atrophy, not elsewhere classified, left lower leg: Secondary | ICD-10-CM | POA: Diagnosis not present

## 2022-04-16 DIAGNOSIS — J069 Acute upper respiratory infection, unspecified: Secondary | ICD-10-CM | POA: Diagnosis not present

## 2022-04-16 DIAGNOSIS — M62551 Muscle wasting and atrophy, not elsewhere classified, right thigh: Secondary | ICD-10-CM | POA: Diagnosis not present

## 2022-04-16 DIAGNOSIS — R051 Acute cough: Secondary | ICD-10-CM | POA: Diagnosis not present

## 2022-04-16 DIAGNOSIS — R2689 Other abnormalities of gait and mobility: Secondary | ICD-10-CM | POA: Diagnosis not present

## 2022-04-16 DIAGNOSIS — R2681 Unsteadiness on feet: Secondary | ICD-10-CM | POA: Diagnosis not present

## 2022-04-16 DIAGNOSIS — M62552 Muscle wasting and atrophy, not elsewhere classified, left thigh: Secondary | ICD-10-CM | POA: Diagnosis not present

## 2022-04-16 DIAGNOSIS — R278 Other lack of coordination: Secondary | ICD-10-CM | POA: Diagnosis not present

## 2022-04-17 DIAGNOSIS — M62561 Muscle wasting and atrophy, not elsewhere classified, right lower leg: Secondary | ICD-10-CM | POA: Diagnosis not present

## 2022-04-17 DIAGNOSIS — M62562 Muscle wasting and atrophy, not elsewhere classified, left lower leg: Secondary | ICD-10-CM | POA: Diagnosis not present

## 2022-04-17 DIAGNOSIS — M62552 Muscle wasting and atrophy, not elsewhere classified, left thigh: Secondary | ICD-10-CM | POA: Diagnosis not present

## 2022-04-17 DIAGNOSIS — R2689 Other abnormalities of gait and mobility: Secondary | ICD-10-CM | POA: Diagnosis not present

## 2022-04-17 DIAGNOSIS — M62551 Muscle wasting and atrophy, not elsewhere classified, right thigh: Secondary | ICD-10-CM | POA: Diagnosis not present

## 2022-04-18 DIAGNOSIS — R278 Other lack of coordination: Secondary | ICD-10-CM | POA: Diagnosis not present

## 2022-04-18 DIAGNOSIS — R2681 Unsteadiness on feet: Secondary | ICD-10-CM | POA: Diagnosis not present

## 2022-04-19 DIAGNOSIS — M62551 Muscle wasting and atrophy, not elsewhere classified, right thigh: Secondary | ICD-10-CM | POA: Diagnosis not present

## 2022-04-19 DIAGNOSIS — M62562 Muscle wasting and atrophy, not elsewhere classified, left lower leg: Secondary | ICD-10-CM | POA: Diagnosis not present

## 2022-04-19 DIAGNOSIS — R2689 Other abnormalities of gait and mobility: Secondary | ICD-10-CM | POA: Diagnosis not present

## 2022-04-19 DIAGNOSIS — M62552 Muscle wasting and atrophy, not elsewhere classified, left thigh: Secondary | ICD-10-CM | POA: Diagnosis not present

## 2022-04-19 DIAGNOSIS — M62561 Muscle wasting and atrophy, not elsewhere classified, right lower leg: Secondary | ICD-10-CM | POA: Diagnosis not present

## 2022-04-24 DIAGNOSIS — R2689 Other abnormalities of gait and mobility: Secondary | ICD-10-CM | POA: Diagnosis not present

## 2022-04-24 DIAGNOSIS — M62551 Muscle wasting and atrophy, not elsewhere classified, right thigh: Secondary | ICD-10-CM | POA: Diagnosis not present

## 2022-04-24 DIAGNOSIS — M62562 Muscle wasting and atrophy, not elsewhere classified, left lower leg: Secondary | ICD-10-CM | POA: Diagnosis not present

## 2022-04-24 DIAGNOSIS — M62552 Muscle wasting and atrophy, not elsewhere classified, left thigh: Secondary | ICD-10-CM | POA: Diagnosis not present

## 2022-04-24 DIAGNOSIS — M62561 Muscle wasting and atrophy, not elsewhere classified, right lower leg: Secondary | ICD-10-CM | POA: Diagnosis not present

## 2022-04-25 DIAGNOSIS — R278 Other lack of coordination: Secondary | ICD-10-CM | POA: Diagnosis not present

## 2022-04-25 DIAGNOSIS — R2681 Unsteadiness on feet: Secondary | ICD-10-CM | POA: Diagnosis not present

## 2022-04-26 DIAGNOSIS — M62551 Muscle wasting and atrophy, not elsewhere classified, right thigh: Secondary | ICD-10-CM | POA: Diagnosis not present

## 2022-04-26 DIAGNOSIS — M62562 Muscle wasting and atrophy, not elsewhere classified, left lower leg: Secondary | ICD-10-CM | POA: Diagnosis not present

## 2022-04-26 DIAGNOSIS — R2689 Other abnormalities of gait and mobility: Secondary | ICD-10-CM | POA: Diagnosis not present

## 2022-04-26 DIAGNOSIS — M62552 Muscle wasting and atrophy, not elsewhere classified, left thigh: Secondary | ICD-10-CM | POA: Diagnosis not present

## 2022-04-26 DIAGNOSIS — M62561 Muscle wasting and atrophy, not elsewhere classified, right lower leg: Secondary | ICD-10-CM | POA: Diagnosis not present

## 2022-04-27 ENCOUNTER — Ambulatory Visit: Payer: Medicare Other | Attending: Cardiology | Admitting: *Deleted

## 2022-04-27 DIAGNOSIS — Z5181 Encounter for therapeutic drug level monitoring: Secondary | ICD-10-CM | POA: Diagnosis not present

## 2022-04-27 DIAGNOSIS — R278 Other lack of coordination: Secondary | ICD-10-CM | POA: Diagnosis not present

## 2022-04-27 DIAGNOSIS — M62551 Muscle wasting and atrophy, not elsewhere classified, right thigh: Secondary | ICD-10-CM | POA: Diagnosis not present

## 2022-04-27 DIAGNOSIS — R2681 Unsteadiness on feet: Secondary | ICD-10-CM | POA: Diagnosis not present

## 2022-04-27 DIAGNOSIS — I639 Cerebral infarction, unspecified: Secondary | ICD-10-CM | POA: Diagnosis not present

## 2022-04-27 DIAGNOSIS — I4891 Unspecified atrial fibrillation: Secondary | ICD-10-CM

## 2022-04-27 DIAGNOSIS — M62562 Muscle wasting and atrophy, not elsewhere classified, left lower leg: Secondary | ICD-10-CM | POA: Diagnosis not present

## 2022-04-27 DIAGNOSIS — R2689 Other abnormalities of gait and mobility: Secondary | ICD-10-CM | POA: Diagnosis not present

## 2022-04-27 DIAGNOSIS — M62561 Muscle wasting and atrophy, not elsewhere classified, right lower leg: Secondary | ICD-10-CM | POA: Diagnosis not present

## 2022-04-27 DIAGNOSIS — M62552 Muscle wasting and atrophy, not elsewhere classified, left thigh: Secondary | ICD-10-CM | POA: Diagnosis not present

## 2022-04-27 LAB — POCT INR: POC INR: 4.5

## 2022-04-27 NOTE — Patient Instructions (Signed)
Description   Hold warfarin 4/20 and 4/21 Then continue taking 1 tablet daily except for 1.5 tablets on Mondays and Thursdays. Stay consistent with greens each week. Recheck INR in 2 weeks. Coumadin Clinic [phone]

## 2022-04-30 DIAGNOSIS — M62562 Muscle wasting and atrophy, not elsewhere classified, left lower leg: Secondary | ICD-10-CM | POA: Diagnosis not present

## 2022-04-30 DIAGNOSIS — M62551 Muscle wasting and atrophy, not elsewhere classified, right thigh: Secondary | ICD-10-CM | POA: Diagnosis not present

## 2022-04-30 DIAGNOSIS — R2689 Other abnormalities of gait and mobility: Secondary | ICD-10-CM | POA: Diagnosis not present

## 2022-04-30 DIAGNOSIS — M62552 Muscle wasting and atrophy, not elsewhere classified, left thigh: Secondary | ICD-10-CM | POA: Diagnosis not present

## 2022-04-30 DIAGNOSIS — M62561 Muscle wasting and atrophy, not elsewhere classified, right lower leg: Secondary | ICD-10-CM | POA: Diagnosis not present

## 2022-05-01 DIAGNOSIS — R278 Other lack of coordination: Secondary | ICD-10-CM | POA: Diagnosis not present

## 2022-05-01 DIAGNOSIS — R2681 Unsteadiness on feet: Secondary | ICD-10-CM | POA: Diagnosis not present

## 2022-05-02 DIAGNOSIS — M62562 Muscle wasting and atrophy, not elsewhere classified, left lower leg: Secondary | ICD-10-CM | POA: Diagnosis not present

## 2022-05-02 DIAGNOSIS — M62552 Muscle wasting and atrophy, not elsewhere classified, left thigh: Secondary | ICD-10-CM | POA: Diagnosis not present

## 2022-05-02 DIAGNOSIS — M62551 Muscle wasting and atrophy, not elsewhere classified, right thigh: Secondary | ICD-10-CM | POA: Diagnosis not present

## 2022-05-02 DIAGNOSIS — R2689 Other abnormalities of gait and mobility: Secondary | ICD-10-CM | POA: Diagnosis not present

## 2022-05-02 DIAGNOSIS — M62561 Muscle wasting and atrophy, not elsewhere classified, right lower leg: Secondary | ICD-10-CM | POA: Diagnosis not present

## 2022-05-03 DIAGNOSIS — M62552 Muscle wasting and atrophy, not elsewhere classified, left thigh: Secondary | ICD-10-CM | POA: Diagnosis not present

## 2022-05-03 DIAGNOSIS — M62561 Muscle wasting and atrophy, not elsewhere classified, right lower leg: Secondary | ICD-10-CM | POA: Diagnosis not present

## 2022-05-03 DIAGNOSIS — R2689 Other abnormalities of gait and mobility: Secondary | ICD-10-CM | POA: Diagnosis not present

## 2022-05-03 DIAGNOSIS — R2681 Unsteadiness on feet: Secondary | ICD-10-CM | POA: Diagnosis not present

## 2022-05-03 DIAGNOSIS — M62562 Muscle wasting and atrophy, not elsewhere classified, left lower leg: Secondary | ICD-10-CM | POA: Diagnosis not present

## 2022-05-03 DIAGNOSIS — M62551 Muscle wasting and atrophy, not elsewhere classified, right thigh: Secondary | ICD-10-CM | POA: Diagnosis not present

## 2022-05-03 DIAGNOSIS — R278 Other lack of coordination: Secondary | ICD-10-CM | POA: Diagnosis not present

## 2022-05-07 DIAGNOSIS — R2689 Other abnormalities of gait and mobility: Secondary | ICD-10-CM | POA: Diagnosis not present

## 2022-05-07 DIAGNOSIS — M62562 Muscle wasting and atrophy, not elsewhere classified, left lower leg: Secondary | ICD-10-CM | POA: Diagnosis not present

## 2022-05-07 DIAGNOSIS — M62551 Muscle wasting and atrophy, not elsewhere classified, right thigh: Secondary | ICD-10-CM | POA: Diagnosis not present

## 2022-05-07 DIAGNOSIS — M62561 Muscle wasting and atrophy, not elsewhere classified, right lower leg: Secondary | ICD-10-CM | POA: Diagnosis not present

## 2022-05-07 DIAGNOSIS — M62552 Muscle wasting and atrophy, not elsewhere classified, left thigh: Secondary | ICD-10-CM | POA: Diagnosis not present

## 2022-05-08 DIAGNOSIS — M62552 Muscle wasting and atrophy, not elsewhere classified, left thigh: Secondary | ICD-10-CM | POA: Diagnosis not present

## 2022-05-08 DIAGNOSIS — R278 Other lack of coordination: Secondary | ICD-10-CM | POA: Diagnosis not present

## 2022-05-08 DIAGNOSIS — M62562 Muscle wasting and atrophy, not elsewhere classified, left lower leg: Secondary | ICD-10-CM | POA: Diagnosis not present

## 2022-05-08 DIAGNOSIS — R2689 Other abnormalities of gait and mobility: Secondary | ICD-10-CM | POA: Diagnosis not present

## 2022-05-08 DIAGNOSIS — R2681 Unsteadiness on feet: Secondary | ICD-10-CM | POA: Diagnosis not present

## 2022-05-08 DIAGNOSIS — M62551 Muscle wasting and atrophy, not elsewhere classified, right thigh: Secondary | ICD-10-CM | POA: Diagnosis not present

## 2022-05-08 DIAGNOSIS — M62561 Muscle wasting and atrophy, not elsewhere classified, right lower leg: Secondary | ICD-10-CM | POA: Diagnosis not present

## 2022-05-11 ENCOUNTER — Ambulatory Visit: Payer: Medicare Other | Attending: Cardiology

## 2022-05-11 DIAGNOSIS — Z5181 Encounter for therapeutic drug level monitoring: Secondary | ICD-10-CM | POA: Diagnosis not present

## 2022-05-11 DIAGNOSIS — Z9889 Other specified postprocedural states: Secondary | ICD-10-CM

## 2022-05-11 DIAGNOSIS — M1711 Unilateral primary osteoarthritis, right knee: Secondary | ICD-10-CM | POA: Diagnosis not present

## 2022-05-11 DIAGNOSIS — Z7901 Long term (current) use of anticoagulants: Secondary | ICD-10-CM

## 2022-05-11 DIAGNOSIS — M62551 Muscle wasting and atrophy, not elsewhere classified, right thigh: Secondary | ICD-10-CM | POA: Diagnosis not present

## 2022-05-11 DIAGNOSIS — I639 Cerebral infarction, unspecified: Secondary | ICD-10-CM

## 2022-05-11 DIAGNOSIS — I4891 Unspecified atrial fibrillation: Secondary | ICD-10-CM | POA: Diagnosis not present

## 2022-05-11 DIAGNOSIS — R2689 Other abnormalities of gait and mobility: Secondary | ICD-10-CM | POA: Diagnosis not present

## 2022-05-11 DIAGNOSIS — M62561 Muscle wasting and atrophy, not elsewhere classified, right lower leg: Secondary | ICD-10-CM | POA: Diagnosis not present

## 2022-05-11 DIAGNOSIS — R278 Other lack of coordination: Secondary | ICD-10-CM | POA: Diagnosis not present

## 2022-05-11 DIAGNOSIS — M62552 Muscle wasting and atrophy, not elsewhere classified, left thigh: Secondary | ICD-10-CM | POA: Diagnosis not present

## 2022-05-11 DIAGNOSIS — R2681 Unsteadiness on feet: Secondary | ICD-10-CM | POA: Diagnosis not present

## 2022-05-11 DIAGNOSIS — M62562 Muscle wasting and atrophy, not elsewhere classified, left lower leg: Secondary | ICD-10-CM | POA: Diagnosis not present

## 2022-05-11 LAB — POCT INR: INR: 2.3 (ref 2.0–3.0)

## 2022-05-11 NOTE — Patient Instructions (Signed)
Description   Continue taking 1 tablet daily except for 1.5 tablets on Mondays and Thursdays.  Stay consistent with greens each week.  Recheck INR in 4 weeks.  Coumadin Clinic [phone]

## 2022-05-14 DIAGNOSIS — R2681 Unsteadiness on feet: Secondary | ICD-10-CM | POA: Diagnosis not present

## 2022-05-14 DIAGNOSIS — M62561 Muscle wasting and atrophy, not elsewhere classified, right lower leg: Secondary | ICD-10-CM | POA: Diagnosis not present

## 2022-05-14 DIAGNOSIS — M62562 Muscle wasting and atrophy, not elsewhere classified, left lower leg: Secondary | ICD-10-CM | POA: Diagnosis not present

## 2022-05-14 DIAGNOSIS — M62552 Muscle wasting and atrophy, not elsewhere classified, left thigh: Secondary | ICD-10-CM | POA: Diagnosis not present

## 2022-05-14 DIAGNOSIS — M62551 Muscle wasting and atrophy, not elsewhere classified, right thigh: Secondary | ICD-10-CM | POA: Diagnosis not present

## 2022-05-14 DIAGNOSIS — R2689 Other abnormalities of gait and mobility: Secondary | ICD-10-CM | POA: Diagnosis not present

## 2022-05-15 DIAGNOSIS — R278 Other lack of coordination: Secondary | ICD-10-CM | POA: Diagnosis not present

## 2022-05-15 DIAGNOSIS — R2681 Unsteadiness on feet: Secondary | ICD-10-CM | POA: Diagnosis not present

## 2022-05-16 DIAGNOSIS — M62561 Muscle wasting and atrophy, not elsewhere classified, right lower leg: Secondary | ICD-10-CM | POA: Diagnosis not present

## 2022-05-16 DIAGNOSIS — M62551 Muscle wasting and atrophy, not elsewhere classified, right thigh: Secondary | ICD-10-CM | POA: Diagnosis not present

## 2022-05-16 DIAGNOSIS — R2681 Unsteadiness on feet: Secondary | ICD-10-CM | POA: Diagnosis not present

## 2022-05-16 DIAGNOSIS — R278 Other lack of coordination: Secondary | ICD-10-CM | POA: Diagnosis not present

## 2022-05-16 DIAGNOSIS — M62562 Muscle wasting and atrophy, not elsewhere classified, left lower leg: Secondary | ICD-10-CM | POA: Diagnosis not present

## 2022-05-16 DIAGNOSIS — R2689 Other abnormalities of gait and mobility: Secondary | ICD-10-CM | POA: Diagnosis not present

## 2022-05-16 DIAGNOSIS — M62552 Muscle wasting and atrophy, not elsewhere classified, left thigh: Secondary | ICD-10-CM | POA: Diagnosis not present

## 2022-05-18 DIAGNOSIS — M62552 Muscle wasting and atrophy, not elsewhere classified, left thigh: Secondary | ICD-10-CM | POA: Diagnosis not present

## 2022-05-18 DIAGNOSIS — M62551 Muscle wasting and atrophy, not elsewhere classified, right thigh: Secondary | ICD-10-CM | POA: Diagnosis not present

## 2022-05-18 DIAGNOSIS — M62562 Muscle wasting and atrophy, not elsewhere classified, left lower leg: Secondary | ICD-10-CM | POA: Diagnosis not present

## 2022-05-18 DIAGNOSIS — M62561 Muscle wasting and atrophy, not elsewhere classified, right lower leg: Secondary | ICD-10-CM | POA: Diagnosis not present

## 2022-05-18 DIAGNOSIS — R2689 Other abnormalities of gait and mobility: Secondary | ICD-10-CM | POA: Diagnosis not present

## 2022-05-22 DIAGNOSIS — M62561 Muscle wasting and atrophy, not elsewhere classified, right lower leg: Secondary | ICD-10-CM | POA: Diagnosis not present

## 2022-05-22 DIAGNOSIS — M62552 Muscle wasting and atrophy, not elsewhere classified, left thigh: Secondary | ICD-10-CM | POA: Diagnosis not present

## 2022-05-22 DIAGNOSIS — M62562 Muscle wasting and atrophy, not elsewhere classified, left lower leg: Secondary | ICD-10-CM | POA: Diagnosis not present

## 2022-05-22 DIAGNOSIS — M62551 Muscle wasting and atrophy, not elsewhere classified, right thigh: Secondary | ICD-10-CM | POA: Diagnosis not present

## 2022-05-22 DIAGNOSIS — R2689 Other abnormalities of gait and mobility: Secondary | ICD-10-CM | POA: Diagnosis not present

## 2022-05-23 DIAGNOSIS — R278 Other lack of coordination: Secondary | ICD-10-CM | POA: Diagnosis not present

## 2022-05-23 DIAGNOSIS — M62561 Muscle wasting and atrophy, not elsewhere classified, right lower leg: Secondary | ICD-10-CM | POA: Diagnosis not present

## 2022-05-23 DIAGNOSIS — M62551 Muscle wasting and atrophy, not elsewhere classified, right thigh: Secondary | ICD-10-CM | POA: Diagnosis not present

## 2022-05-23 DIAGNOSIS — M62562 Muscle wasting and atrophy, not elsewhere classified, left lower leg: Secondary | ICD-10-CM | POA: Diagnosis not present

## 2022-05-23 DIAGNOSIS — R2681 Unsteadiness on feet: Secondary | ICD-10-CM | POA: Diagnosis not present

## 2022-05-23 DIAGNOSIS — M62552 Muscle wasting and atrophy, not elsewhere classified, left thigh: Secondary | ICD-10-CM | POA: Diagnosis not present

## 2022-05-23 DIAGNOSIS — R2689 Other abnormalities of gait and mobility: Secondary | ICD-10-CM | POA: Diagnosis not present

## 2022-05-24 DIAGNOSIS — M62562 Muscle wasting and atrophy, not elsewhere classified, left lower leg: Secondary | ICD-10-CM | POA: Diagnosis not present

## 2022-05-24 DIAGNOSIS — M62561 Muscle wasting and atrophy, not elsewhere classified, right lower leg: Secondary | ICD-10-CM | POA: Diagnosis not present

## 2022-05-24 DIAGNOSIS — R2689 Other abnormalities of gait and mobility: Secondary | ICD-10-CM | POA: Diagnosis not present

## 2022-05-24 DIAGNOSIS — M62552 Muscle wasting and atrophy, not elsewhere classified, left thigh: Secondary | ICD-10-CM | POA: Diagnosis not present

## 2022-05-24 DIAGNOSIS — M62551 Muscle wasting and atrophy, not elsewhere classified, right thigh: Secondary | ICD-10-CM | POA: Diagnosis not present

## 2022-05-25 DIAGNOSIS — M62561 Muscle wasting and atrophy, not elsewhere classified, right lower leg: Secondary | ICD-10-CM | POA: Diagnosis not present

## 2022-05-25 DIAGNOSIS — R2689 Other abnormalities of gait and mobility: Secondary | ICD-10-CM | POA: Diagnosis not present

## 2022-05-25 DIAGNOSIS — M62562 Muscle wasting and atrophy, not elsewhere classified, left lower leg: Secondary | ICD-10-CM | POA: Diagnosis not present

## 2022-05-25 DIAGNOSIS — R2681 Unsteadiness on feet: Secondary | ICD-10-CM | POA: Diagnosis not present

## 2022-05-25 DIAGNOSIS — M62552 Muscle wasting and atrophy, not elsewhere classified, left thigh: Secondary | ICD-10-CM | POA: Diagnosis not present

## 2022-05-25 DIAGNOSIS — R278 Other lack of coordination: Secondary | ICD-10-CM | POA: Diagnosis not present

## 2022-05-25 DIAGNOSIS — M62551 Muscle wasting and atrophy, not elsewhere classified, right thigh: Secondary | ICD-10-CM | POA: Diagnosis not present

## 2022-05-28 DIAGNOSIS — R3914 Feeling of incomplete bladder emptying: Secondary | ICD-10-CM | POA: Diagnosis not present

## 2022-05-29 DIAGNOSIS — R278 Other lack of coordination: Secondary | ICD-10-CM | POA: Diagnosis not present

## 2022-05-29 DIAGNOSIS — R2681 Unsteadiness on feet: Secondary | ICD-10-CM | POA: Diagnosis not present

## 2022-05-29 NOTE — Progress Notes (Unsigned)
Cardiology Office Note:    Date:  05/30/2022  ID:  Gerald Lou., DOB 1935-09-17, MRN [id number] PCP: Jarrett Soho, PA-C  [facility] Providers Cardiologist:  Donato Schultz, MD Cardiology APP:  Beatrice Lecher, PA-C          Patient Profile:   Aortic insufficiency; s/p bioprosthetic AVR and PFO closure in 2012 Severe MR, severe TR >> S/p MV Repair, TV Repair, MAZE, LA clipping in 01/2018 TTE 03/22/21: EF 60-65, RVSP 50.8, Asc aorta (36 mm), Ao root (41 mm)  TTE 03/19/22: EF 65-70, no RWMA, mod LVH, NL RVSF, severely elevated PASP, RVSP 60.4, severe BAE, s/p MV repair w trivial MR, mean 9 (NL structure and fxn); s/p TV repair; s/p AVR w trivial AI, no AS, mean 15.4, mod PI, mild dilation of aortic root (40 mm), RAP 15  Pulmonary hypertension (RVSP 03/2022: 60.4) ?related to age, COPD-no further w/u felt to be needed Hypertension Atenolol DCd in 3/22 due to ? HR but resumed due to uncontrolled BP Permanent atrial fibrillation/flutter Chronic anticoagulation w Warfarin Monitor 4/22: 151 SV runs; Mobitz 1, PACs 6.4%, rare PVCs, poss AF/Flutter Hx of R MCA CVA tx with cath based Rx Acute/subacute L frontal CVA 05/2021 Coronary artery disease Non-obstructive by cath in 10/14/17 (pLCx 30, ost LAD 30, pLAD 40) Carotid artery stenosis ([place] 01/20/18: L 1-39) Aortic atherosclerosis  Dilated thoracic aorta Echocardiogram 3/24:  root 40 mm Hx of GI bleed Borderline diabetes mellitus BPH Peripheral neuropathy L hip fracture due to mechanical fall 7/22 >> s/p L THR Low albumin S/p L TKR 09/2021  Anemia   Dr. Truett Perna (heme)      History of Present Illness:   Gerald Grissett. is a 87 y.o. male who returns for f/u of valvular heart disease, CHF, AFib, CAD. He was last seen 11/29/21. He is here with his wife. He is doing well w/o chest pain, significant shortness of breath, syncope, orthopnea, or significant leg edema. He is thinking he may need to have R knee replacement in the  near future.   Review of Systems  Gastrointestinal:  Negative for hematochezia and melena.  Genitourinary:  Negative for hematuria.   See HPI    Studies Reviewed:    EKG:  not done  Risk Assessment/Calculations:    CHA2DS2-VASc Score = 7   This indicates a 11.2% annual risk of stroke. The patient's score is based upon: CHF History: 1 HTN History: 1 Diabetes History: 0 Stroke History: 2 Vascular Disease History: 1 Age Score: 2 Gender Score: 0            Physical Exam:   VS:  BP 121/72 (BP Location: Right Arm)   Pulse 66   Ht 6' (1.829 m)   Wt 175 lb 3.2 oz (79.5 kg)   SpO2 96%   BMI 23.76 kg/m    Wt Readings from Last 3 Encounters:  05/30/22 175 lb 3.2 oz (79.5 kg)  02/16/22 177 lb (80.3 kg)  02/16/22 177 lb 6.4 oz (80.5 kg)    Constitutional:      Appearance: Healthy appearance. Not in distress.  Neck:     Vascular: No JVR. JVD normal.  Pulmonary:     Breath sounds: Normal breath sounds. No wheezing. No rales.  Cardiovascular:     Normal rate. Regular rhythm.     Murmurs: There is a grade 2/6 systolic murmur at the URSB.  Edema:    Peripheral edema absent.  Abdominal:  Palpations: Abdomen is soft.       ASSESSMENT AND PLAN:   Valvular heart disease History of bioprosthetic AVR in 2012 and mitral valve repair, tricuspid valve repair in 2020.  TTE in 03/2022 demonstrated normal structure and function of the MV repair, TV repair and AVR. Continue SBE prophylaxis.    Acquired dilation of ascending aorta and aortic root (HCC) 40 mm on TTE in 03/2022. Consider repeat Echocardiogram in 2025.  CAD (coronary artery disease) Nonobstructive CAD by cardiac catheterization in 2019.  He is not having chest pain to suggest angina. He is not on aspirin as he is on warfarin.  Continue rosuvastatin 10 mg daily.  Essential hypertension Blood pressure is controlled.  Continue amlodipine 10 mg daily, atenolol 12.5 mg daily.  HLD (hyperlipidemia) LDL optimal in May  2023 at 44.  Continue rosuvastatin 10 mg daily.  Other secondary pulmonary hypertension (HCC) As noted, I reviewed his echocardiogram previously with Dr. Excell Seltzer.  His pulm hypertension is likely related to chronic valvular heart disease status post prior surgery, age and possible COPD.  No further testing is indicated.  He does not have symptoms of shortness of breath.  Permanent atrial fibrillation (HCC) Heart rate is controlled.  He is tolerating anticoagulation.  He has been using some hemp cream for ankle pain.  I advised him that CBD oil can increase his INR.  He has been using this for several weeks and his most recent INR was in range.  Therefore, I think it is safe for him to continue.  Continue follow-up in the Coumadin clinic for management of his Coumadin.  Continue atenolol 12.5 mg daily.  Preoperative cardiovascular examination Mr. Teta notes that he may need R TKR in the near future. His perioperative risk of a major cardiac event is 6.6% according to the Revised Cardiac Risk Index (RCRI).  Therefore, he is at high risk for perioperative complications. However, his functional capacity is good at 4.31 METs according to the Duke Activity Status Index (DASI). Recommendations: According to ACC/AHA guidelines, no further cardiovascular testing needed.  The patient may proceed to surgery at acceptable risk.   Antiplatelet and/or Anticoagulation Recommendations: Coumadin can by held for his surgery. The patient will require Lovenox bridging while off of Coumadin. This will need to be coordinated with our coumadin clinic.        Dispo:  Return in about 6 months (around 11/30/2022) for Routine Follow Up, w/ Tereso Newcomer, PA-C.  Signed, Tereso Newcomer, PA-C

## 2022-05-30 ENCOUNTER — Encounter: Payer: Self-pay | Admitting: Physician Assistant

## 2022-05-30 ENCOUNTER — Ambulatory Visit: Payer: Medicare Other | Attending: Physician Assistant | Admitting: Physician Assistant

## 2022-05-30 VITALS — BP 121/72 | HR 66 | Ht 72.0 in | Wt 175.2 lb

## 2022-05-30 DIAGNOSIS — E7800 Pure hypercholesterolemia, unspecified: Secondary | ICD-10-CM

## 2022-05-30 DIAGNOSIS — I38 Endocarditis, valve unspecified: Secondary | ICD-10-CM | POA: Diagnosis not present

## 2022-05-30 DIAGNOSIS — I2729 Other secondary pulmonary hypertension: Secondary | ICD-10-CM | POA: Diagnosis not present

## 2022-05-30 DIAGNOSIS — I4821 Permanent atrial fibrillation: Secondary | ICD-10-CM | POA: Diagnosis not present

## 2022-05-30 DIAGNOSIS — I2510 Atherosclerotic heart disease of native coronary artery without angina pectoris: Secondary | ICD-10-CM

## 2022-05-30 DIAGNOSIS — I10 Essential (primary) hypertension: Secondary | ICD-10-CM | POA: Diagnosis not present

## 2022-05-30 DIAGNOSIS — Z01810 Encounter for preprocedural cardiovascular examination: Secondary | ICD-10-CM | POA: Diagnosis not present

## 2022-05-30 DIAGNOSIS — I77819 Aortic ectasia, unspecified site: Secondary | ICD-10-CM | POA: Diagnosis not present

## 2022-05-30 NOTE — Assessment & Plan Note (Signed)
As noted, I reviewed his echocardiogram previously with Dr. Excell Seltzer.  His pulm hypertension is likely related to chronic valvular heart disease status post prior surgery, age and possible COPD.  No further testing is indicated.  He does not have symptoms of shortness of breath.

## 2022-05-30 NOTE — Assessment & Plan Note (Signed)
Nonobstructive CAD by cardiac catheterization in 2019.  He is not having chest pain to suggest angina. He is not on aspirin as he is on warfarin.  Continue rosuvastatin 10 mg daily.

## 2022-05-30 NOTE — Assessment & Plan Note (Signed)
Blood pressure is controlled.  Continue amlodipine 10 mg daily, atenolol 12.5 mg daily.

## 2022-05-30 NOTE — Assessment & Plan Note (Signed)
40 mm on TTE in 03/2022. Consider repeat Echocardiogram in 2025.

## 2022-05-30 NOTE — Assessment & Plan Note (Signed)
Heart rate is controlled.  He is tolerating anticoagulation.  He has been using some hemp cream for ankle pain.  I advised him that CBD oil can increase his INR.  He has been using this for several weeks and his most recent INR was in range.  Therefore, I think it is safe for him to continue.  Continue follow-up in the Coumadin clinic for management of his Coumadin.  Continue atenolol 12.5 mg daily.

## 2022-05-30 NOTE — Assessment & Plan Note (Signed)
History of bioprosthetic AVR in 2012 and mitral valve repair, tricuspid valve repair in 2020.  TTE in 03/2022 demonstrated normal structure and function of the MV repair, TV repair and AVR. Continue SBE prophylaxis.

## 2022-05-30 NOTE — Patient Instructions (Signed)
Medication Instructions:  Your physician recommends that you continue on your current medications as directed. Please refer to the Current Medication list given to you today.  *If you need a refill on your cardiac medications before your next appointment, please call your pharmacy*   Lab Work: None ordered  If you have labs (blood work) drawn today and your tests are completely normal, you will receive your results only by: MyChart Message (if you have MyChart) OR A paper copy in the mail If you have any lab test that is abnormal or we need to change your treatment, we will call you to review the results.   Testing/Procedures: None ordered   Follow-Up: At [facility], you and your health needs are our priority.  As part of our continuing mission to provide you with exceptional heart care, we have created designated Provider Care Teams.  These Care Teams include your primary Cardiologist (physician) and Advanced Practice Providers (APPs -  Physician Assistants and Nurse Practitioners) who all work together to provide you with the care you need, when you need it.  We recommend signing up for the patient portal called "MyChart".  Sign up information is provided on this After Visit Summary.  MyChart is used to connect with patients for Virtual Visits (Telemedicine).  Patients are able to view lab/test results, encounter notes, upcoming appointments, etc.  Non-urgent messages can be sent to your provider as well.   To learn more about what you can do with MyChart, go to [email or web address].    Your next appointment:   6 month(s)  Provider:   Scott Weaver, PA-C         Other Instructions   

## 2022-05-30 NOTE — Assessment & Plan Note (Addendum)
Mr. Yebra notes that he may need R TKR in the near future. His perioperative risk of a major cardiac event is 6.6% according to the Revised Cardiac Risk Index (RCRI).  Therefore, he is at high risk for perioperative complications. However, his functional capacity is good at 4.31 METs according to the Duke Activity Status Index (DASI). Recommendations: According to ACC/AHA guidelines, no further cardiovascular testing needed.  The patient may proceed to surgery at acceptable risk.   Antiplatelet and/or Anticoagulation Recommendations: Coumadin can by held for his surgery. The patient will require Lovenox bridging while off of Coumadin. This will need to be coordinated with our coumadin clinic.

## 2022-05-30 NOTE — Assessment & Plan Note (Signed)
LDL optimal in May 2023 at 38.  Continue rosuvastatin 10 mg daily.

## 2022-05-31 DIAGNOSIS — R278 Other lack of coordination: Secondary | ICD-10-CM | POA: Diagnosis not present

## 2022-05-31 DIAGNOSIS — R2681 Unsteadiness on feet: Secondary | ICD-10-CM | POA: Diagnosis not present

## 2022-06-06 ENCOUNTER — Other Ambulatory Visit: Payer: Self-pay | Admitting: Adult Health

## 2022-06-06 DIAGNOSIS — R2681 Unsteadiness on feet: Secondary | ICD-10-CM | POA: Diagnosis not present

## 2022-06-06 DIAGNOSIS — R278 Other lack of coordination: Secondary | ICD-10-CM | POA: Diagnosis not present

## 2022-06-06 DIAGNOSIS — G629 Polyneuropathy, unspecified: Secondary | ICD-10-CM | POA: Diagnosis not present

## 2022-06-08 ENCOUNTER — Ambulatory Visit: Payer: Medicare Other

## 2022-06-11 DIAGNOSIS — R2681 Unsteadiness on feet: Secondary | ICD-10-CM | POA: Diagnosis not present

## 2022-06-11 DIAGNOSIS — R278 Other lack of coordination: Secondary | ICD-10-CM | POA: Diagnosis not present

## 2022-06-12 ENCOUNTER — Ambulatory Visit: Payer: Medicare Other | Attending: Cardiology | Admitting: *Deleted

## 2022-06-12 DIAGNOSIS — Z5181 Encounter for therapeutic drug level monitoring: Secondary | ICD-10-CM

## 2022-06-12 DIAGNOSIS — I4891 Unspecified atrial fibrillation: Secondary | ICD-10-CM

## 2022-06-12 DIAGNOSIS — I639 Cerebral infarction, unspecified: Secondary | ICD-10-CM

## 2022-06-12 LAB — POCT INR: POC INR: 2.9

## 2022-06-12 NOTE — Patient Instructions (Signed)
Description   Continue taking 1 tablet daily except for 1.5 tablets on Mondays.   Stay consistent with greens each week.  Recheck INR in 5 weeks.  Please let us know if you have any procedures scheduled.  Coumadin Clinic [phone]

## 2022-06-13 DIAGNOSIS — R2681 Unsteadiness on feet: Secondary | ICD-10-CM | POA: Diagnosis not present

## 2022-06-13 DIAGNOSIS — R278 Other lack of coordination: Secondary | ICD-10-CM | POA: Diagnosis not present

## 2022-06-15 ENCOUNTER — Inpatient Hospital Stay: Payer: Medicare Other | Attending: Oncology

## 2022-06-15 ENCOUNTER — Inpatient Hospital Stay: Payer: Medicare Other | Admitting: Oncology

## 2022-06-15 VITALS — BP 130/72 | HR 70 | Temp 98.2°F | Resp 18 | Ht 72.0 in | Wt 176.5 lb

## 2022-06-15 DIAGNOSIS — R339 Retention of urine, unspecified: Secondary | ICD-10-CM | POA: Diagnosis not present

## 2022-06-15 DIAGNOSIS — D696 Thrombocytopenia, unspecified: Secondary | ICD-10-CM | POA: Insufficient documentation

## 2022-06-15 DIAGNOSIS — D649 Anemia, unspecified: Secondary | ICD-10-CM

## 2022-06-15 DIAGNOSIS — I2510 Atherosclerotic heart disease of native coronary artery without angina pectoris: Secondary | ICD-10-CM | POA: Insufficient documentation

## 2022-06-15 DIAGNOSIS — I4891 Unspecified atrial fibrillation: Secondary | ICD-10-CM | POA: Diagnosis not present

## 2022-06-15 DIAGNOSIS — Z7901 Long term (current) use of anticoagulants: Secondary | ICD-10-CM | POA: Diagnosis not present

## 2022-06-15 DIAGNOSIS — M1711 Unilateral primary osteoarthritis, right knee: Secondary | ICD-10-CM | POA: Diagnosis not present

## 2022-06-15 LAB — CBC WITH DIFFERENTIAL (CANCER CENTER ONLY)
Abs Immature Granulocytes: 0.01 10*3/uL (ref 0.00–0.07)
Basophils Absolute: 0 10*3/uL (ref 0.0–0.1)
Basophils Relative: 1 %
Eosinophils Absolute: 0.2 10*3/uL (ref 0.0–0.5)
Eosinophils Relative: 3 %
HCT: 34.9 % — ABNORMAL LOW (ref 39.0–52.0)
Hemoglobin: 11.6 g/dL — ABNORMAL LOW (ref 13.0–17.0)
Immature Granulocytes: 0 %
Lymphocytes Relative: 26 %
Lymphs Abs: 1.5 10*3/uL (ref 0.7–4.0)
MCH: 31.7 pg (ref 26.0–34.0)
MCHC: 33.2 g/dL (ref 30.0–36.0)
MCV: 95.4 fL (ref 80.0–100.0)
Monocytes Absolute: 0.6 10*3/uL (ref 0.1–1.0)
Monocytes Relative: 11 %
Neutro Abs: 3.5 10*3/uL (ref 1.7–7.7)
Neutrophils Relative %: 59 %
Platelet Count: 134 10*3/uL — ABNORMAL LOW (ref 150–400)
RBC: 3.66 MIL/uL — ABNORMAL LOW (ref 4.22–5.81)
RDW: 14.5 % (ref 11.5–15.5)
WBC Count: 5.8 10*3/uL (ref 4.0–10.5)
nRBC: 0 % (ref 0.0–0.2)

## 2022-06-15 LAB — FERRITIN: Ferritin: 104 ng/mL (ref 24–336)

## 2022-06-15 NOTE — Progress Notes (Signed)
  [facility] OFFICE PROGRESS NOTE   Diagnosis: Anemia  INTERVAL HISTORY:   [name] returns as scheduled.  He feels well.  No bleeding other than easy bruising.  No fever, night sweats, or recent infection.  He is taking iron.  He plans to schedule right knee replacement surgery within the next month.  Objective:  Vital signs in last 24 hours:  Blood pressure 130/72, pulse 70, temperature 98.2 F (36.8 C), temperature source Oral, resp. rate 18, height 6' (1.829 m), weight 176 lb 8 oz (80.1 kg), SpO2 98 %.   Lymphatics: No cervical, supraclavicular, axillary, or inguinal nodes Resp: Clear bilaterally Cardio: Regular rate and rhythm GI: No hepatosplenomegaly Vascular: Trace lower leg edema bilaterally  Skin: Ecchymoses over the arms bilaterally  Portacath/PICC-without erythema  Lab Results:  Lab Results  Component Value Date   WBC 5.8 06/15/2022   HGB 11.6 (L) 06/15/2022   HCT 34.9 (L) 06/15/2022   MCV 95.4 06/15/2022   PLT 134 (L) 06/15/2022   NEUTROABS 3.5 06/15/2022    CMP  Lab Results  Component Value Date   NA 139 02/16/2022   K 3.5 02/16/2022   CL 108 02/16/2022   CO2 22 02/16/2022   GLUCOSE 117 (H) 02/16/2022   BUN 21 02/16/2022   CREATININE 0.88 02/16/2022   CALCIUM 8.6 (L) 02/16/2022   PROT 7.4 02/16/2022   ALBUMIN 4.1 02/16/2022   AST 30 02/16/2022   ALT 27 02/16/2022   ALKPHOS 49 02/16/2022   BILITOT 1.1 02/16/2022   GFRNONAA >60 02/16/2022   GFRAA 83 11/25/2019     Medications: I have reviewed the patient's current medications.   Assessment/Plan: Anemia Normal ferritin, normal vitamin B12, and negative stool Hemoccult cards January 2023 Left hip fracture July 2022 status post left hip replacement  Hospitalization August 2020 with symptomatic anemia, heme positive stool, ferritin 19, status post EGD;  colonoscopy 10/21/2018, gastritis with hemorrhage Atrial fibrillation on Coumadin CAD AVR Mitral valve repair Left  total knee replacement 09/19/2021 Admission with urinary retention 09/23/2021 Chronic mild thrombocytopenia    Disposition: [name] has mild normocytic anemia.  The anemia has been more significant following surgical procedures.  I suspect the mild anemia is related to surgeries and hospital chronic GI bleeding.  He also has chronic mild thrombocytopenia.  He could have early myelodysplasia.  The plan is to continue observation.  He will continue iron. He is scheduling right knee replacement surgery for within the next month.  He will return for an office visit and CBC in 4 months.   Thornton Papas, MD  06/15/2022  10:05 AM

## 2022-06-20 DIAGNOSIS — R278 Other lack of coordination: Secondary | ICD-10-CM | POA: Diagnosis not present

## 2022-06-20 DIAGNOSIS — R2681 Unsteadiness on feet: Secondary | ICD-10-CM | POA: Diagnosis not present

## 2022-06-22 DIAGNOSIS — R2681 Unsteadiness on feet: Secondary | ICD-10-CM | POA: Diagnosis not present

## 2022-06-22 DIAGNOSIS — R278 Other lack of coordination: Secondary | ICD-10-CM | POA: Diagnosis not present

## 2022-06-27 DIAGNOSIS — R2681 Unsteadiness on feet: Secondary | ICD-10-CM | POA: Diagnosis not present

## 2022-06-27 DIAGNOSIS — R278 Other lack of coordination: Secondary | ICD-10-CM | POA: Diagnosis not present

## 2022-06-28 ENCOUNTER — Ambulatory Visit (INDEPENDENT_AMBULATORY_CARE_PROVIDER_SITE_OTHER): Payer: Medicare Other | Admitting: Neurology

## 2022-06-28 DIAGNOSIS — M21372 Foot drop, left foot: Secondary | ICD-10-CM | POA: Insufficient documentation

## 2022-06-28 DIAGNOSIS — M21371 Foot drop, right foot: Secondary | ICD-10-CM | POA: Diagnosis not present

## 2022-06-28 DIAGNOSIS — I639 Cerebral infarction, unspecified: Secondary | ICD-10-CM | POA: Diagnosis not present

## 2022-06-28 DIAGNOSIS — R269 Unspecified abnormalities of gait and mobility: Secondary | ICD-10-CM | POA: Insufficient documentation

## 2022-06-28 NOTE — Progress Notes (Signed)
ASSESSMENT AND PLAN  [name]. is a 87 y.o. male   Gradual worsening bilateral lower extremity paresthesia, gait abnormality,  Examination showed moderate bilateral ankle dorsiflexion weakness, length-dependent sensory changes, decreased reflexes,  Differentiation diagnosis including lumbar radiculopathy, peripheral neuropathy,  I have suggested MRI of the lumbar spine, EMG nerve conduction study for further evaluation, he wants to hold off evaluation at this point,  Referral to local physical therapy, potential bilateral AFO,  History of stroke,  Continue Coumadin  Return To Clinic With NP In 6 Months   DIAGNOSTIC DATA (LABS, IMAGING, TESTING) - I reviewed patient records, labs, notes, testing and imaging myself where available.   MEDICAL HISTORY:  [name]. was seen by Shanda Bumps in the past for stroke, most recent visit in November 2023, accompanying his wife at today's visit, who is seen by our clinic for dementia, he has a lot of question about his gait abnormality, desires to be evaluated today, his primary care physician is Denton Ar, [place], [place]    I reviewed and summarized the referring note. PMHX CAD Aortic valve replacement  in 2012 Mitral valve, tricuspid valve repair in 2020 History of small bowel obstruction s/p surgical intervention in 2011 HTN Stroke,  Long term coumadin use HLD Prostate Hypertrophy Gout Atrial fibrillation, status post maze,  He had a history of stroke presenting with slurred speech, left facial droop,  On May 21, 2021 he presented with generalized weakness, MRI of the brain showed small left frontal positive DWI lesion, CT angiogram of head and neck was negative, evidence of intracranial atherosclerotic disease  Echocardiogram ejection fraction 60 to 65%, severely dilated left atrium, left ventricular hypertrophy,  This happened in the setting of tooth infection, sepsis, he has been treated with Coumadin  due to his cardiac condition, atrial fibrillation,  He and his wife moved to [facility] since the end of 2023, over the past couple years, he noticed increased gait abnormality, ascending bilateral lower extremity paresthesia, initially starting at the bottom of his feet, now to ankle level, balance issues, fairly symmetric  He denies significant neck pain, low back pain, denies bowel and bladder incontinence, fell sometimes,  Personally reviewed MRI of brain in Nov 2023,  1. No acute finding. 2. Chronic ischemia including remote right MCA branch infarct.  Also CT head and neck following his weakness and fall in February 2024, CT head showed atrophy stable right MCA old stroke CT cervical spine showed no acute abnormality multiple degenerative changes, severe osteoporosis of left C4-5, right C5-6, with evidence of foraminal narrowing,  PHYSICAL EXAM  Sitting down 143/75,  Standing up 125/69, HR 61   PHYSICAL EXAMNIATION:  Gen: NAD, conversant, well nourised, well groomed                     Cardiovascular: Regular rate rhythm, no peripheral edema, warm, nontender. Eyes: Conjunctivae clear without exudates or hemorrhage Neck: Supple, no carotid bruits. Pulmonary: Clear to auscultation bilaterally   NEUROLOGICAL EXAM:  MENTAL STATUS: Speech/cognition: Awake, alert, oriented to history taking and casual conversation CRANIAL NERVES: CN II: Visual fields are full to confrontation. Pupils are round equal and briskly reactive to light. CN III, IV, VI: extraocular movement are normal. No ptosis. CN V: Facial sensation is intact to light touch CN VII: Face is symmetric with normal eye closure  CN VIII: Hearing is normal to causal conversation. CN IX, X: Phonation is normal. CN XI: Head turning and shoulder  shrug are intact  MOTOR: No significant upper extremity and lower extremity proximal muscle weakness, moderate bilateral ankle dorsiflexion weakness mild bilateral ankle  plantarflexion weakness  REFLEXES: Reflexes are 1 apps and symmetric at the biceps, triceps, absent at knees, and ankles. Plantar responses are flexor.  SENSORY: Length-dependent decreased light touch, pinprick to knee level, decreased vibratory sensation to ankle level, absent bilateral toe proprioception,  COORDINATION: There is no trunk or limb dysmetria noted.  GAIT/STANCE: Need push-up to get up from seated position, wide-based, unsteady bilateral foot drop, could not stand up on heels,  REVIEW OF SYSTEMS:  Full 14 system review of systems performed and notable only for as above All other review of systems were negative.   ALLERGIES: Allergies  Allergen Reactions   Sulfa Antibiotics Other (See Comments)    Granulocytosis   Sulfamethoxazole-Trimethoprim     Other Reaction(s): Unknown   Zestril [Lisinopril] Cough    HOME MEDICATIONS: Current Outpatient Medications  Medication Sig Dispense Refill   allopurinol (ZYLOPRIM) 300 MG tablet Take 150 mg by mouth every morning.     amLODipine (NORVASC) 10 MG tablet TAKE 1 TABLET BY MOUTH DAILY 90 tablet 3   atenolol (TENORMIN) 25 MG tablet Take 0.5 tablets (12.5 mg total) by mouth daily. 30 tablet 4   cholecalciferol (VITAMIN D3) 25 MCG (1000 UNIT) tablet Take 1,000 Units by mouth daily.     Cinnamon 500 MG capsule Take 500 mg by mouth every morning.     ferrous sulfate 325 (65 FE) MG EC tablet Take 325 mg by mouth 2 (two) times daily.     finasteride (PROSCAR) 5 MG tablet Take 1 tablet (5 mg total) by mouth daily. 30 tablet 0   gabapentin (NEURONTIN) 300 MG capsule TAKE 1 CAPSULE BY MOUTH TWICE  DAILY 120 capsule 1   Multiple Vitamins-Minerals (CENTRUM SILVER ADULT 50+) TABS Take 1 tablet by mouth daily.     rosuvastatin (CRESTOR) 10 MG tablet TAKE 1 TABLET BY MOUTH DAILY 90 tablet 2   silodosin (RAPAFLO) 8 MG CAPS capsule Take 8 mg by mouth daily.     traZODone (DESYREL) 50 MG tablet Take 50 mg by mouth at bedtime as needed for  sleep.     warfarin (COUMADIN) 5 MG tablet Take 1 tablet (5 mg total) by mouth daily at 4 PM. Until seen by warfarin clinic. 135 tablet 3   No current facility-administered medications for this visit.    PAST MEDICAL HISTORY: Past Medical History:  Diagnosis Date   Acquired dilation of ascending aorta and aortic root (HCC)    43mm aortic root and 41mm ascending aorta on echo 03/2020   Arthritis    Bladder stones    Borderline diabetes    BPH (benign prostatic hyperplasia)    CAD (coronary artery disease) 12/19/2020   Non-obstructive coronary artery disease at cath in 2019   Coronary artery disease    cardiologist-  dr Anne Fu  Lawson Fiscal gerhart NP--- per cath 06-02-2010 non-obstructive cad pLAD 30-40%   CVA (cerebral vascular accident) (HCC) 10/24/2016   Diverticulosis of colon    Dysrhythmia    afib   Gout    Heart murmur    History of adenomatous polyp of colon    2002-- tubular adenoma   History of aortic insufficiency    severe -- s/p  AVR 08-03-2010   History of small bowel obstruction    02/ 2007 mechanical sbo s/p  surgical intervention;  partial sbo 09/ 2011 and 03-20-2011 resolved without  surgical intervention   History of urinary retention    HTN (hypertension)    Other secondary pulmonary hypertension (HCC) 03/20/2022   TTE 03/19/22: EF 65-70, no RWMA, moderate LVH, normal RVSF, severe pulmonary hypertension (RVSP 60.4), severe BAE, normal structure and function of mitral valve repair (mean gradient 9), mild TR, trivial AI, normal structure and function of AVR (mean 15.4), moderate PI, aortic root and ascending aorta 40 mm, RAP 15   Peripheral neuropathy    Persistent atrial fibrillation (HCC)    S/P aortic valve replacement with prosthetic valve 08/03/2010   tissue valve   S/P Maze operation for atrial fibrillation 01/30/2018   Complete bilateral atrial lesion set using bipolar radiofrequency and cryothermy with clipping of LA appendage   S/P MVR (mitral valve repair)  01/30/2018   Complex valvuloplasty including artificial Gore-tex neochord placement x4 and Carbo medics Annuloflex ring annuloplasty, size 28   S/P patent foramen ovale closure 08/03/2010   at same time AVR   S/P tricuspid valve repair 01/30/2018   Using an MC3 Annuloplasty ring, size 28   Stroke (HCC)    Tricuspid regurgitation     PAST SURGICAL HISTORY: Past Surgical History:  Procedure Laterality Date   BIOPSY  09/07/2018   Procedure: BIOPSY;  Surgeon: Kathi Der, MD;  Location: [facility];  Service: Gastroenterology;;   CARDIAC CATHETERIZATION  06-02-2010  dr Anne Fu   non-obstructive cad- pLAD 30-40%/  normal LVSF/  severe AI   CARDIOVASCULAR STRESS TEST  04/12/2016   Low risk nuclear perfusion study w/ no significant reversible ischemia/  normal LV function and wall motion ,  stress ef 60%/  2mm inferior and lateral scooped ST-segment depression w/ exercise (may be repolarization abnormality), exercise capacity was moderately reduced   CATARACT EXTRACTION W/ INTRAOCULAR LENS  IMPLANT, BILATERAL  02/2010   CLIPPING OF ATRIAL APPENDAGE N/A 01/30/2018   Procedure: CLIPPING OF LEFT ATRIAL APPENDAGE USING ATRICLIP PRO2 ;  Surgeon: Purcell Nails, MD;  Location: [facility] OR;  Service: Open Heart Surgery;  Laterality: N/A;   COLONOSCOPY WITH PROPOFOL N/A 10/21/2018   Procedure: COLONOSCOPY WITH PROPOFOL;  Surgeon: Kathi Der, MD;  Location: [facility];  Service: Gastroenterology;  Laterality: N/A;   CYSTOSCOPY WITH LITHOLAPAXY N/A 06/05/2016   Procedure: CYSTOSCOPY WITH LITHOLAPAXY and fulgarization of bladder neck;  Surgeon: Bjorn Pippin, MD;  Location: [facility];  Service: Urology;  Laterality: N/A;   ESOPHAGOGASTRODUODENOSCOPY (EGD) WITH PROPOFOL N/A 09/07/2018   Procedure: ESOPHAGOGASTRODUODENOSCOPY (EGD) WITH PROPOFOL;  Surgeon: Kathi Der, MD;  Location: [facility];  Service: Gastroenterology;  Laterality: N/A;   EXPLORATORY LAPARTOMY /   CHOLECYSTECTOMY  02/28/2005   for Small  bowel obstruction (mechnical)   IR ANGIO EXTRACRAN SEL COM CAROTID INNOMINATE UNI L MOD SED  10/24/2016   IR ANGIO VERTEBRAL SEL SUBCLAVIAN INNOMINATE BILAT MOD SED  10/24/2016   IR PERCUTANEOUS ART THROMBECTOMY/INFUSION INTRACRANIAL INC DIAG ANGIO  10/24/2016   IR RADIOLOGIST EVAL & MGMT  12/05/2016   LEFT KNEE ARTHROSCOPY  2006   MAZE N/A 01/30/2018   Procedure: MAZE;  Surgeon: Purcell Nails, MD;  Location: [facility] OR;  Service: Open Heart Surgery;  Laterality: N/A;   MITRAL VALVE REPAIR N/A 01/30/2018   Procedure: MITRAL VALVE REPAIR (MVR) USING CARBOMEDICS ANNULOFLEX SIZE 28;  Surgeon: Purcell Nails, MD;  Location: [facility] OR;  Service: Open Heart Surgery;  Laterality: N/A;   POLYPECTOMY  10/21/2018   Procedure: POLYPECTOMY;  Surgeon: Kathi Der, MD;  Location: [facility];  Service:  Gastroenterology;;   RADIOLOGY WITH ANESTHESIA N/A 10/24/2016   Procedure: RADIOLOGY WITH ANESTHESIA;  Surgeon: Julieanne Cotton, MD;  Location: [facility] OR;  Service: Radiology;  Laterality: N/A;   RIGHT FOOT SURGERY     RIGHT MINIATURE ANTERIOR THORACOTOMY FOR AORTIC VALVE REPLACEMENT AND CLOSURE PATENT FORAMEN OVALE  08-03-2010  DR Neldon Labella Magna-ease pericardial tissue valve (25mm)   RIGHT/LEFT HEART CATH AND CORONARY ANGIOGRAPHY N/A 10/14/2017   Procedure: RIGHT/LEFT HEART CATH AND CORONARY ANGIOGRAPHY;  Surgeon: Tonny Bollman, MD;  Location: [facility] INVASIVE CV LAB;  Service: Cardiovascular;  Laterality: N/A;   TEE WITHOUT CARDIOVERSION N/A 10/14/2017   Procedure: TRANSESOPHAGEAL ECHOCARDIOGRAM (TEE);  Surgeon: Wendall Stade, MD;  Location: [facility] ENDOSCOPY;  Service: Cardiovascular;  Laterality: N/A;   TOTAL HIP ARTHROPLASTY Left 08/02/2020   Procedure: TOTAL HIP ARTHROPLASTY ANTERIOR APPROACH;  Surgeon: Durene Romans, MD;  Location: [facility];  Service: Orthopedics;  Laterality: Left;   TOTAL KNEE ARTHROPLASTY Left 09/19/2021   Procedure: TOTAL KNEE ARTHROPLASTY;   Surgeon: Durene Romans, MD;  Location: [facility];  Service: Orthopedics;  Laterality: Left;   TRANSTHORACIC ECHOCARDIOGRAM  05/30/2016  dr skains   moderate  LVH ef 60-65%/  bioprothesis aortic valve present ,normal grandient and no AI /  mild MV calcification , moderate MR /  mild PR/ moderate TR/  PASP 36mmHg/ (RA denisty was identified 04-27-2016 echo) and is seen again today, this is likely a promient eustacian ridge, atrium is normal size   TRICUSPID VALVE REPLACEMENT N/A 01/30/2018   Procedure: TRICUSPID VALVE REPAIR USING MC3 SIZE 28;  Surgeon: Purcell Nails, MD;  Location: [facility] OR;  Service: Open Heart Surgery;  Laterality: N/A;    FAMILY HISTORY: Family History  Problem Relation Age of Onset   Heart disease Mother    Brain cancer Father     SOCIAL HISTORY: Social History   Socioeconomic History   Marital status: Married    Spouse name: Jenel Lucks   Number of children: 2   Years of education: Not on file   Highest education level: Not on file  Occupational History   Occupation: RETIRED  Tobacco Use   Smoking status: Never   Smokeless tobacco: Never  Vaping Use   Vaping Use: Never used  Substance and Sexual Activity   Alcohol use: Yes    Comment: ONE OR TWO PER MONTH   Drug use: No   Sexual activity: Not on file  Other Topics Concern   Not on file  Social History Narrative   Not on file   Social Determinants of Health   Financial Resource Strain: Low Risk  (06/16/2021)   Overall Financial Resource Strain (CARDIA)    Difficulty of Paying Living Expenses: Not hard at all  Food Insecurity: No Food Insecurity (11/09/2021)   Hunger Vital Sign    Worried About Running Out of Food in the Last Year: Never true    Ran Out of Food in the Last Year: Never true  Transportation Needs: No Transportation Needs (11/09/2021)   PRAPARE - Administrator, Civil Service (Medical): No    Lack of Transportation (Non-Medical): No  Physical Activity: Not on file  Stress: No  Stress Concern Present (06/16/2021)   [employer] of Occupational Health - Occupational Stress Questionnaire    Feeling of Stress : Only a little  Social Connections: Unknown (06/02/2021)   Social Connection and Isolation Panel [NHANES]    Frequency of Communication with Friends and Family: Twice a week  Frequency of Social Gatherings with Friends and Family: Twice a week    Attends Religious Services: Patient declined    Active Member of Clubs or Organizations: Patient declined    Attends Banker Meetings: Patient declined    Marital Status: Married  Catering manager Violence: Not At Risk (11/09/2021)   Humiliation, Afraid, Rape, and Kick questionnaire    Fear of Current or Ex-Partner: No    Emotionally Abused: No    Physically Abused: No    Sexually Abused: No      Levert Feinstein, M.D. Ph.D.  [facility] [address] [place], [place] [address] Ph: [phone] Fax: [phone]  CC:  Jarrett Soho, PA-C [address] [place],  [place] [address]  Jarrett Soho, PA-C

## 2022-06-29 DIAGNOSIS — R278 Other lack of coordination: Secondary | ICD-10-CM | POA: Diagnosis not present

## 2022-06-29 DIAGNOSIS — R2681 Unsteadiness on feet: Secondary | ICD-10-CM | POA: Diagnosis not present

## 2022-07-02 ENCOUNTER — Telehealth: Payer: Self-pay | Admitting: Neurology

## 2022-07-02 DIAGNOSIS — R2681 Unsteadiness on feet: Secondary | ICD-10-CM | POA: Diagnosis not present

## 2022-07-02 DIAGNOSIS — R278 Other lack of coordination: Secondary | ICD-10-CM | POA: Diagnosis not present

## 2022-07-02 NOTE — Telephone Encounter (Signed)
Referrral faxed to [facility] Phone: [phone]  Fax: [phone]

## 2022-07-04 DIAGNOSIS — R2681 Unsteadiness on feet: Secondary | ICD-10-CM | POA: Diagnosis not present

## 2022-07-04 DIAGNOSIS — R278 Other lack of coordination: Secondary | ICD-10-CM | POA: Diagnosis not present

## 2022-07-05 DIAGNOSIS — M62572 Muscle wasting and atrophy, not elsewhere classified, left ankle and foot: Secondary | ICD-10-CM | POA: Diagnosis not present

## 2022-07-05 DIAGNOSIS — R2689 Other abnormalities of gait and mobility: Secondary | ICD-10-CM | POA: Diagnosis not present

## 2022-07-05 DIAGNOSIS — M62571 Muscle wasting and atrophy, not elsewhere classified, right ankle and foot: Secondary | ICD-10-CM | POA: Diagnosis not present

## 2022-07-06 DIAGNOSIS — M62571 Muscle wasting and atrophy, not elsewhere classified, right ankle and foot: Secondary | ICD-10-CM | POA: Diagnosis not present

## 2022-07-06 DIAGNOSIS — M62572 Muscle wasting and atrophy, not elsewhere classified, left ankle and foot: Secondary | ICD-10-CM | POA: Diagnosis not present

## 2022-07-06 DIAGNOSIS — R2689 Other abnormalities of gait and mobility: Secondary | ICD-10-CM | POA: Diagnosis not present

## 2022-07-10 ENCOUNTER — Telehealth: Payer: Self-pay | Admitting: *Deleted

## 2022-07-10 DIAGNOSIS — R278 Other lack of coordination: Secondary | ICD-10-CM | POA: Diagnosis not present

## 2022-07-10 DIAGNOSIS — R2681 Unsteadiness on feet: Secondary | ICD-10-CM | POA: Diagnosis not present

## 2022-07-10 NOTE — Telephone Encounter (Addendum)
Patient with diagnosis of afib and stroke on warfarin for anticoagulation.    Procedure:  RIGHT TOTAL KNEE ARTHROPLASTY  Date of procedure: 08/09/22   CHA2DS2-VASc Score = 7   This indicates a 11.2% annual risk of stroke. The patient's score is based upon: CHF History: 1 HTN History: 1 Diabetes History: 0 Stroke History: 2 Vascular Disease History: 1 Age Score: 2 Gender Score: 0      CrCl 67 ml/min Platelet count 134  Per office protocol, patient can hold warfarin for 5 days prior to procedure.   Patient WILL need bridging with Lovenox (enoxaparin) around procedure.  Bridge will be coordinated by our coumadin clinic  **This guidance is not considered finalized until pre-operative APP has relayed final recommendations.**

## 2022-07-10 NOTE — Telephone Encounter (Signed)
   Pre-operative Risk Assessment    Patient Name: [name].  DOB: Nov 16, 1935 MRN: [id number]      Request for Surgical Clearance    Procedure:   RIGHT TOTAL KNEE ARTHROPLASTY  Date of Surgery:  Clearance 08/09/22                                 Surgeon:  DR. MATTHEW OLIN  Surgeon's Group or Practice Name:  [facility] Phone number:  [phone] ATTN: Rosalva Ferron  Fax number:  [phone]   Type of Clearance Requested:   - Medical  - Pharmacy:  Hold Warfarin (Coumadin)     Type of Anesthesia:  Spinal   Additional requests/questions:    Elpidio Anis   07/10/2022, 1:14 PM

## 2022-07-10 NOTE — Telephone Encounter (Signed)
Pharmacy please advise on holding Coumadin prior to right total knee arthroplasty scheduled for 08/09/2022. Thank you.

## 2022-07-10 NOTE — Telephone Encounter (Signed)
I left a message for the patient to call our office to schedule a tele visit for pre-op 

## 2022-07-10 NOTE — Telephone Encounter (Signed)
   Name: [name].  DOB: March 15, 1935  MRN: [id number]  Primary Cardiologist: Donato Schultz, MD   Preoperative team, please contact this patient and set up a phone call appointment for further preoperative risk assessment. Please obtain consent and complete medication review. Thank you for your help.  I confirm that guidance regarding antiplatelet and oral anticoagulation therapy has been completed and, if necessary, noted below.  Per office protocol, patient can hold warfarin for 5 days prior to procedure.   Patient WILL need bridging with Lovenox (enoxaparin) around procedure.   Bridge will be coordinated by our coumadin clinic   Napoleon Form, Leodis Rains, NP 07/10/2022, 3:06 PM [facility]

## 2022-07-11 ENCOUNTER — Telehealth: Payer: Self-pay | Admitting: Neurology

## 2022-07-11 ENCOUNTER — Telehealth: Payer: Self-pay | Admitting: *Deleted

## 2022-07-11 DIAGNOSIS — R278 Other lack of coordination: Secondary | ICD-10-CM | POA: Diagnosis not present

## 2022-07-11 DIAGNOSIS — R2681 Unsteadiness on feet: Secondary | ICD-10-CM | POA: Diagnosis not present

## 2022-07-11 DIAGNOSIS — M62572 Muscle wasting and atrophy, not elsewhere classified, left ankle and foot: Secondary | ICD-10-CM | POA: Diagnosis not present

## 2022-07-11 DIAGNOSIS — R269 Unspecified abnormalities of gait and mobility: Secondary | ICD-10-CM

## 2022-07-11 DIAGNOSIS — M21371 Foot drop, right foot: Secondary | ICD-10-CM

## 2022-07-11 DIAGNOSIS — M62571 Muscle wasting and atrophy, not elsewhere classified, right ankle and foot: Secondary | ICD-10-CM | POA: Diagnosis not present

## 2022-07-11 DIAGNOSIS — R2689 Other abnormalities of gait and mobility: Secondary | ICD-10-CM | POA: Diagnosis not present

## 2022-07-11 NOTE — Telephone Encounter (Signed)
Pt has been scheduled for tele pre op 07/30/22 @ 9:40. Med rec and consent are done.     Patient Consent for Virtual Visit        [name]. has provided verbal consent on 07/11/2022 for a virtual visit (video or telephone).   CONSENT FOR VIRTUAL VISIT FOR:  [name].  By participating in this virtual visit I agree to the following:  I hereby voluntarily request, consent and authorize [facility] and its employed or contracted physicians, physician assistants, nurse practitioners or other licensed health care professionals (the Practitioner), to provide me with telemedicine health care services (the "Services") as deemed necessary by the treating Practitioner. I acknowledge and consent to receive the Services by the Practitioner via telemedicine. I understand that the telemedicine visit will involve communicating with the Practitioner through live audiovisual communication technology and the disclosure of certain medical information by electronic transmission. I acknowledge that I have been given the opportunity to request an in-person assessment or other available alternative prior to the telemedicine visit and am voluntarily participating in the telemedicine visit.  I understand that I have the right to withhold or withdraw my consent to the use of telemedicine in the course of my care at any time, without affecting my right to future care or treatment, and that the Practitioner or I may terminate the telemedicine visit at any time. I understand that I have the right to inspect all information obtained and/or recorded in the course of the telemedicine visit and may receive copies of available information for a reasonable fee.  I understand that some of the potential risks of receiving the Services via telemedicine include:  Delay or interruption in medical evaluation due to technological equipment failure or disruption; Information transmitted may not be sufficient  (e.g. poor resolution of images) to allow for appropriate medical decision making by the Practitioner; and/or  In rare instances, security protocols could fail, causing a breach of personal health information.  Furthermore, I acknowledge that it is my responsibility to provide information about my medical history, conditions and care that is complete and accurate to the best of my ability. I acknowledge that Practitioner's advice, recommendations, and/or decision may be based on factors not within their control, such as incomplete or inaccurate data provided by me or distortions of diagnostic images or specimens that may result from electronic transmissions. I understand that the practice of medicine is not an exact science and that Practitioner makes no warranties or guarantees regarding treatment outcomes. I acknowledge that a copy of this consent can be made available to me via my patient portal [facility] MyChart), or I can request a printed copy by calling the office of [facility].    I understand that my insurance will be billed for this visit.   I have read or had this consent read to me. I understand the contents of this consent, which adequately explains the benefits and risks of the Services being provided via telemedicine.  I have been provided ample opportunity to ask questions regarding this consent and the Services and have had my questions answered to my satisfaction. I give my informed consent for the services to be provided through the use of telemedicine in my medical care

## 2022-07-11 NOTE — Telephone Encounter (Signed)
Pt has been scheduled for tele pre op 07/30/22 @ 9:40. Med rec and consent are done.

## 2022-07-11 NOTE — Telephone Encounter (Signed)
Pt asked if he might be able to move his coumadin appt time up sooner on 07/17/22. I sent a secure chat to our CVRR team with question from the pt to see if he could come in on 07/17/22 either 11:45 or 12pm. Pt states he has PT 12:15 downstairs after his coumadin appt and just didn't want to come in too early.   I did check with our pharm-d, per Phillips Hay, Pharm-d their schedule is completely full that day, so I would say no.   I have informed the pt that they have no sooner appts. Pt said that was ok and he just thought he would ask. Pt thanked me for the help.

## 2022-07-11 NOTE — Telephone Encounter (Signed)
[facility]  Dewayne Hatch) requesting a referral for AFO for  bilateral foot drop. Fax: [phone].

## 2022-07-13 DIAGNOSIS — M62572 Muscle wasting and atrophy, not elsewhere classified, left ankle and foot: Secondary | ICD-10-CM | POA: Diagnosis not present

## 2022-07-13 DIAGNOSIS — R2689 Other abnormalities of gait and mobility: Secondary | ICD-10-CM | POA: Diagnosis not present

## 2022-07-13 DIAGNOSIS — M62571 Muscle wasting and atrophy, not elsewhere classified, right ankle and foot: Secondary | ICD-10-CM | POA: Diagnosis not present

## 2022-07-14 ENCOUNTER — Other Ambulatory Visit: Payer: Self-pay | Admitting: Cardiology

## 2022-07-14 ENCOUNTER — Other Ambulatory Visit: Payer: Self-pay | Admitting: Physician Assistant

## 2022-07-16 DIAGNOSIS — R2689 Other abnormalities of gait and mobility: Secondary | ICD-10-CM | POA: Diagnosis not present

## 2022-07-16 DIAGNOSIS — M62572 Muscle wasting and atrophy, not elsewhere classified, left ankle and foot: Secondary | ICD-10-CM | POA: Diagnosis not present

## 2022-07-16 DIAGNOSIS — M62571 Muscle wasting and atrophy, not elsewhere classified, right ankle and foot: Secondary | ICD-10-CM | POA: Diagnosis not present

## 2022-07-16 NOTE — Telephone Encounter (Signed)
Orders Placed This Encounter  Procedures  . PT orthosis to lower extremity   

## 2022-07-16 NOTE — Addendum Note (Signed)
Addended by: Levert Feinstein on: 07/16/2022 11:51 AM   Modules accepted: Orders

## 2022-07-17 ENCOUNTER — Ambulatory Visit: Payer: Medicare Other | Attending: Cardiology

## 2022-07-17 DIAGNOSIS — I4891 Unspecified atrial fibrillation: Secondary | ICD-10-CM

## 2022-07-17 DIAGNOSIS — Z5181 Encounter for therapeutic drug level monitoring: Secondary | ICD-10-CM

## 2022-07-17 DIAGNOSIS — I639 Cerebral infarction, unspecified: Secondary | ICD-10-CM | POA: Diagnosis not present

## 2022-07-17 DIAGNOSIS — Z9889 Other specified postprocedural states: Secondary | ICD-10-CM

## 2022-07-17 DIAGNOSIS — M25561 Pain in right knee: Secondary | ICD-10-CM | POA: Diagnosis not present

## 2022-07-17 DIAGNOSIS — M1711 Unilateral primary osteoarthritis, right knee: Secondary | ICD-10-CM | POA: Diagnosis not present

## 2022-07-17 DIAGNOSIS — R278 Other lack of coordination: Secondary | ICD-10-CM | POA: Diagnosis not present

## 2022-07-17 DIAGNOSIS — Z7901 Long term (current) use of anticoagulants: Secondary | ICD-10-CM

## 2022-07-17 DIAGNOSIS — R2681 Unsteadiness on feet: Secondary | ICD-10-CM | POA: Diagnosis not present

## 2022-07-17 LAB — POCT INR: INR: 2.7 (ref 2.0–3.0)

## 2022-07-17 NOTE — Telephone Encounter (Signed)
Forms faxed to [facility]

## 2022-07-17 NOTE — Progress Notes (Signed)
Surgery orders requested via Epic inbox. °

## 2022-07-17 NOTE — Patient Instructions (Signed)
Description   Continue taking 1 tablet daily except for 1.5 tablets on Mondays.   Stay consistent with greens each week.  Recheck INR in 2 weeks prior to knee surgery on 08/09/22 for Lovenox bridging.  Please let us know if you have any procedures scheduled.  Coumadin Clinic [phone]

## 2022-07-18 DIAGNOSIS — R2689 Other abnormalities of gait and mobility: Secondary | ICD-10-CM | POA: Diagnosis not present

## 2022-07-18 DIAGNOSIS — M62571 Muscle wasting and atrophy, not elsewhere classified, right ankle and foot: Secondary | ICD-10-CM | POA: Diagnosis not present

## 2022-07-18 DIAGNOSIS — M62572 Muscle wasting and atrophy, not elsewhere classified, left ankle and foot: Secondary | ICD-10-CM | POA: Diagnosis not present

## 2022-07-19 DIAGNOSIS — R2681 Unsteadiness on feet: Secondary | ICD-10-CM | POA: Diagnosis not present

## 2022-07-19 DIAGNOSIS — R278 Other lack of coordination: Secondary | ICD-10-CM | POA: Diagnosis not present

## 2022-07-20 NOTE — Progress Notes (Signed)
Second request for pre op orders in [facility]: Left a voicemail for [employer] at [place].

## 2022-07-22 NOTE — Progress Notes (Addendum)
COVID Vaccine received:  []  No [x]  Yes Date of any COVID positive Test in last 90 days:  none  PCP - Jarrett Soho, PA-C  [facility],  Medical clearance on chart  [phone] Cardiologist - Donato Schultz,  MD,  Tereso Newcomer, PA-C  Chest x-ray - 02-16-2022  2v  Epic EKG - 02-16-2022  Epic  Stress Test - 2018  Epic ECHO - 03-19-2022  Epic Cardiac Cath - 10-24-2017  [facility] by Dr. Excell Seltzer TTE- 03-20-2022 Epic    PCR screen: [x]  Ordered & Completed           []   No Order but Needs PROFEND           []   N/A for this surgery  Surgery Plan:  []  Ambulatory                            [x]  Outpatient in bed                            []  Admit  Anesthesia:    []  General  [x]  Spinal                           []   Choice []   MAC  Pacemaker / ICD device [x]  No []  Yes   Spinal Cord Stimulator:[x]  No []  Yes       History of Sleep Apnea? [x]  No []  Yes   CPAP used?- [x]  No []  Yes    Does the patient monitor blood sugar?          []  No []  Yes  [x]  N/A  Patient has: []  NO Hx DM   [x]  Pre-DM                 []  DM1  []   DM2  Blood Thinner / Instructions:  Coumadin  (has cardiac preop on 07-30-22 and [facility] appt 08-01-22- plan to do Lovenox bridge)  patient is aware of these dates and the importance of doing the Lovenox.  Aspirin Instructions:  None  ERAS Protocol Ordered: []  No  [x]  Yes PRE-SURGERY []  ENSURE  [x]  G2  Patient is to be NPO after:  09:30     Comments: Patient was given the 5 CHG shower / bath instructions for TKA  along with 2 bottles of the CHG soap. Patient will start this on:  Sunday 08-05-2022          All questions were asked and answered, Patient voiced understanding of this process.   Activity level: Patient is unable to climb a flight of stairs without difficulty; [x]  No CP  but would have _SOB and Leg pain.  Patient can  perform ADLs without assistance.   Anesthesia review: S/p AVR / MAZE 01-30-2018, PFO closure / valve surgery 2012, CVA-  bilat. Foot  drop, CHF, Pre-DM (diet)  Patient denies shortness of breath, fever, cough and chest pain at PAT appointment.  Patient verbalized understanding and agreement to the Pre-Surgical Instructions that were given to them at this PAT appointment. Patient was also educated of the need to review these PAT instructions again prior to his surgery.I reviewed the appropriate phone numbers to call if they have any and questions or concerns.

## 2022-07-22 NOTE — Patient Instructions (Addendum)
SURGICAL WAITING ROOM VISITATION Patients having surgery or a procedure may have no more than 2 support people in the waiting area - these visitors may rotate in the visitor waiting room.   Due to an increase in RSV and influenza rates and associated hospitalizations, children ages 11 and under may not visit patients in [facility] hospitals. If the patient needs to stay at the hospital during part of their recovery, the visitor guidelines for inpatient rooms apply.  PRE-OP VISITATION  Pre-op nurse will coordinate an appropriate time for 1 support person to accompany the patient in pre-op.  This support person may not rotate.  This visitor will be contacted when the time is appropriate for the visitor to come back in the pre-op area.  Please refer to the [facility] website for the visitor guidelines for Inpatients (after your surgery is over and you are in a regular room).  You are not required to quarantine at this time prior to your surgery. However, you must do this: Hand Hygiene often Do NOT share personal items Notify your provider if you are in close contact with someone who has COVID or you develop fever 100.4 or greater, new onset of sneezing, cough, sore throat, shortness of breath or body aches.  If you test positive for Covid or have been in contact with anyone that has tested positive in the last 10 days please notify you surgeon.    Your procedure is scheduled on:  Thursday  August 09, 2022  Report to [facility] Main Entrance: [address] entrance where the [employer] is available.   Report to admitting at:  10:00   AM  Call this number if you have any questions or problems the morning of surgery [phone]  Do not eat food after Midnight the night prior to your surgery/procedure.  After Midnight you may have the following liquids until   09:30 AM DAY OF SURGERY  Clear Liquid Diet Water Black Coffee (sugar ok, NO MILK/CREAM OR CREAMERS)  Tea (sugar ok, NO  MILK/CREAM OR CREAMERS) regular and decaf                             Plain Jell-O  with no fruit (NO RED)                                           Fruit ices (not with fruit pulp, NO RED)                                     Popsicles (NO RED)                                                                  Juice: NO CITRUS JUICES: only apple, WHITE grape, WHITE cranberry Sports drinks like Gatorade or Powerade (NO RED)                    The day of surgery:  Drink ONE (1) Pre-Surgery G2 at  09:30 AM the morning of surgery.  Drink in one sitting. Do not sip.  This drink was given to you during your hospital pre-op appointment visit. Nothing else to drink after completing the Pre-Surgery  G2 : No candy, chewing gum or throat lozenges.    FOLLOW  ANY ADDITIONAL PRE OP INSTRUCTIONS YOU RECEIVED FROM YOUR SURGEON'S OFFICE!!!   Oral Hygiene is also important to reduce your risk of infection.        Remember - BRUSH YOUR TEETH THE MORNING OF SURGERY WITH YOUR REGULAR TOOTHPASTE  Do NOT smoke after Midnight the night before surgery.   Take ONLY these medicines the morning of surgery with A SIP OF WATER: atenolol, amlodipine and finasteride (Proscar)   You may not have any metal on your body including  jewelry, and body piercing  Do not wear  lotions, powders,  cologne, or deodorant  Men may shave face and neck.  Contacts, Hearing Aids, dentures or bridgework may not be worn into surgery. DENTURES WILL BE REMOVED PRIOR TO SURGERY PLEASE DO NOT APPLY "Poly grip" OR ADHESIVES!!!  You may bring a small overnight bag with you on the day of surgery, only pack items that are not valuable. [facility] IS NOT RESPONSIBLE   FOR VALUABLES THAT ARE LOST OR STOLEN.   Do not bring your home medications to the hospital. The Pharmacy will dispense medications listed on your medication list to you during your admission in the Hospital.  Special Instructions: Bring a copy of your healthcare power of  attorney and living will documents the day of surgery, if you wish to have them scanned into your [facility] Records- EPIC  Please read over the following fact sheets you were given: IF YOU HAVE QUESTIONS ABOUT YOUR PRE-OP INSTRUCTIONS, PLEASE CALL [phone].       Pre-operative 5 CHG Bath Instructions   You can play a key role in reducing the risk of infection after surgery. Your skin needs to be as free of germs as possible. You can reduce the number of germs on your skin by washing with CHG (chlorhexidine gluconate) soap before surgery. CHG is an antiseptic soap that kills germs and continues to kill germs even after washing.   DO NOT use if you have an allergy to chlorhexidine/CHG or antibacterial soaps. If your skin becomes reddened or irritated, stop using the CHG and notify one of our RNs at [phone]  Please shower with the CHG soap starting 4 days before surgery using the following schedule: START SHOWERS ON  SUNDA  August 05, 2022  Please keep in mind the following:  DO NOT shave, including legs and underarms, starting the day of your first shower.   You may shave your face at any point before/day of surgery.   Place clean sheets on your bed the day you start using CHG soap. Use a clean washcloth (not used since being washed) for each shower. DO NOT sleep with pets once you start using the CHG.   CHG Shower Instructions:  If you choose to wash your hair and private area, wash first with your normal shampoo/soap.  After you use shampoo/soap, rinse your hair and body thoroughly to remove shampoo/soap residue.  Turn the water OFF and apply about 3 tablespoons (45 ml) of CHG soap to a CLEAN washcloth.  Apply CHG soap ONLY FROM YOUR NECK DOWN TO YOUR TOES (washing for 3-5 minutes)  DO  NOT use CHG soap on face, private areas, open wounds, or sores.  Pay special attention to the area where your surgery is being performed.  If you are having back surgery, having someone wash your back for you may be helpful.  Wait 2 minutes after CHG soap is applied, then you may rinse off the CHG soap.  Pat dry with a clean towel  Put on clean clothes/pajamas   If you choose to wear lotion, please use ONLY the CHG-compatible lotions on the back of this paper.     Additional instructions for the day of surgery: DO NOT APPLY any lotions, deodorants, cologne, or perfumes.   Put on clean/comfortable clothes.  Brush your teeth.  Ask your nurse before applying any prescription medications to the skin.      CHG Compatible Lotions   Aveeno Moisturizing lotion  Cetaphil Moisturizing Cream  Cetaphil Moisturizing Lotion  Clairol Herbal Essence Moisturizing Lotion, Dry Skin  Clairol Herbal Essence Moisturizing Lotion, Extra Dry Skin  Clairol Herbal Essence Moisturizing Lotion, Normal Skin  Curel Age Defying Therapeutic Moisturizing Lotion with Alpha Hydroxy  Curel Extreme Care Body Lotion  Curel Soothing Hands Moisturizing Hand Lotion  Curel Therapeutic Moisturizing Cream, Fragrance-Free  Curel Therapeutic Moisturizing Lotion, Fragrance-Free  Curel Therapeutic Moisturizing Lotion, Original Formula  Eucerin Daily Replenishing Lotion  Eucerin Dry Skin Therapy Plus Alpha Hydroxy Crme  Eucerin Dry Skin Therapy Plus Alpha Hydroxy Lotion  Eucerin Original Crme  Eucerin Original Lotion  Eucerin Plus Crme Eucerin Plus Lotion  Eucerin TriLipid Replenishing Lotion  Keri Anti-Bacterial Hand Lotion  Keri Deep Conditioning Original Lotion Dry Skin Formula Softly Scented  Keri Deep Conditioning Original Lotion, Fragrance Free Sensitive Skin Formula  Keri Lotion Fast Absorbing Fragrance Free Sensitive Skin Formula  Keri Lotion Fast Absorbing Softly Scented Dry Skin Formula  Keri Original  Lotion  Keri Skin Renewal Lotion Keri Silky Smooth Lotion  Keri Silky Smooth Sensitive Skin Lotion  Nivea Body Creamy Conditioning Oil  Nivea Body Extra Enriched Lotion  Nivea Body Original Lotion  Nivea Body Sheer Moisturizing Lotion Nivea Crme  Nivea Skin Firming Lotion  NutraDerm 30 Skin Lotion  NutraDerm Skin Lotion  NutraDerm Therapeutic Skin Cream  NutraDerm Therapeutic Skin Lotion  ProShield Protective Hand Cream  Provon moisturizing lotion   FAILURE TO FOLLOW THESE INSTRUCTIONS MAY RESULT IN THE CANCELLATION OF YOUR SURGERY  PATIENT SIGNATURE_________________________________  NURSE SIGNATURE__________________________________  ________________________________________________________________________       Rogelia Mire    An incentive spirometer is a tool that can help keep your lungs clear and active. This tool measures how well you are filling your lungs with each breath. Taking  long deep breaths may help reverse or decrease the chance of developing breathing (pulmonary) problems (especially infection) following: A long period of time when you are unable to move or be active. BEFORE THE PROCEDURE  If the spirometer includes an indicator to show your best effort, your nurse or respiratory therapist will set it to a desired goal. If possible, sit up straight or lean slightly forward. Try not to slouch. Hold the incentive spirometer in an upright position. INSTRUCTIONS FOR USE  Sit on the edge of your bed if possible, or sit up as far as you can in bed or on a chair. Hold the incentive spirometer in an upright position. Breathe out normally. Place the mouthpiece in your mouth and seal your lips tightly around it. Breathe in slowly and as deeply as possible, raising the piston or the ball toward the top of the column. Hold your breath for 3-5 seconds or for as long as possible. Allow the piston or ball to fall to the bottom of the column. Remove the mouthpiece  from your mouth and breathe out normally. Rest for a few seconds and repeat Steps 1 through 7 at least 10 times every 1-2 hours when you are awake. Take your time and take a few normal breaths between deep breaths. The spirometer may include an indicator to show your best effort. Use the indicator as a goal to work toward during each repetition. After each set of 10 deep breaths, practice coughing to be sure your lungs are clear. If you have an incision (the cut made at the time of surgery), support your incision when coughing by placing a pillow or rolled up towels firmly against it. Once you are able to get out of bed, walk around indoors and cough well. You may stop using the incentive spirometer when instructed by your caregiver.  RISKS AND COMPLICATIONS Take your time so you do not get dizzy or light-headed. If you are in pain, you may need to take or ask for pain medication before doing incentive spirometry. It is harder to take a deep breath if you are having pain. AFTER USE Rest and breathe slowly and easily. It can be helpful to keep track of a log of your progress. Your caregiver can provide you with a simple table to help with this. If you are using the spirometer at home, follow these instructions: SEEK MEDICAL CARE IF:  You are having difficultly using the spirometer. You have trouble using the spirometer as often as instructed. Your pain medication is not giving enough relief while using the spirometer. You develop fever of 100.5 F (38.1 C) or higher.                                                                                                    SEEK IMMEDIATE MEDICAL CARE IF:  You cough up bloody sputum that had not been present before. You develop fever of 102 F (38.9 C) or greater. You develop worsening pain at or near the incision site. MAKE SURE YOU:  Understand these instructions. Will watch your condition. Will  get help right away if you are not doing well or get  worse. Document Released: 05/07/2006 Document Revised: 03/19/2011 Document Reviewed: 07/08/2006 [facility] Patient Information 2014 [place], [place].

## 2022-07-23 DIAGNOSIS — M62571 Muscle wasting and atrophy, not elsewhere classified, right ankle and foot: Secondary | ICD-10-CM | POA: Diagnosis not present

## 2022-07-23 DIAGNOSIS — M62572 Muscle wasting and atrophy, not elsewhere classified, left ankle and foot: Secondary | ICD-10-CM | POA: Diagnosis not present

## 2022-07-23 DIAGNOSIS — R2689 Other abnormalities of gait and mobility: Secondary | ICD-10-CM | POA: Diagnosis not present

## 2022-07-24 ENCOUNTER — Other Ambulatory Visit: Payer: Self-pay

## 2022-07-24 ENCOUNTER — Encounter (HOSPITAL_COMMUNITY)
Admission: RE | Admit: 2022-07-24 | Discharge: 2022-07-24 | Disposition: A | Discharge status: Home / Self Care | Payer: Medicare Other | Source: Ambulatory Visit | Attending: Orthopedic Surgery | Admitting: Orthopedic Surgery

## 2022-07-24 ENCOUNTER — Encounter (HOSPITAL_COMMUNITY): Payer: Self-pay

## 2022-07-24 VITALS — BP 128/71 | HR 64 | Temp 98.4°F | Resp 18 | Ht 72.0 in | Wt 175.2 lb

## 2022-07-24 DIAGNOSIS — I38 Endocarditis, valve unspecified: Secondary | ICD-10-CM | POA: Diagnosis not present

## 2022-07-24 DIAGNOSIS — Z01818 Encounter for other preprocedural examination: Secondary | ICD-10-CM

## 2022-07-24 DIAGNOSIS — D6859 Other primary thrombophilia: Secondary | ICD-10-CM | POA: Insufficient documentation

## 2022-07-24 DIAGNOSIS — I10 Essential (primary) hypertension: Secondary | ICD-10-CM | POA: Insufficient documentation

## 2022-07-24 DIAGNOSIS — Z01812 Encounter for preprocedural laboratory examination: Secondary | ICD-10-CM | POA: Insufficient documentation

## 2022-07-24 DIAGNOSIS — M62572 Muscle wasting and atrophy, not elsewhere classified, left ankle and foot: Secondary | ICD-10-CM | POA: Diagnosis not present

## 2022-07-24 DIAGNOSIS — R278 Other lack of coordination: Secondary | ICD-10-CM | POA: Diagnosis not present

## 2022-07-24 DIAGNOSIS — R2689 Other abnormalities of gait and mobility: Secondary | ICD-10-CM | POA: Diagnosis not present

## 2022-07-24 DIAGNOSIS — M62571 Muscle wasting and atrophy, not elsewhere classified, right ankle and foot: Secondary | ICD-10-CM | POA: Diagnosis not present

## 2022-07-24 DIAGNOSIS — R2681 Unsteadiness on feet: Secondary | ICD-10-CM | POA: Diagnosis not present

## 2022-07-24 HISTORY — DX: Other complications of anesthesia, initial encounter: T88.59XA

## 2022-07-24 HISTORY — DX: Thrombocytopenia, unspecified: D69.6

## 2022-07-24 HISTORY — DX: Prediabetes: R73.03

## 2022-07-24 LAB — BASIC METABOLIC PANEL WITH GFR
Anion gap: 11 (ref 5–15)
BUN: 15 mg/dL (ref 8–23)
CO2: 23 mmol/L (ref 22–32)
Calcium: 9 mg/dL (ref 8.9–10.3)
Chloride: 109 mmol/L (ref 98–111)
Creatinine, Ser: 0.86 mg/dL (ref 0.61–1.24)
GFR, Estimated: >60 mL/min (ref >60)
Glucose, Bld: 122 mg/dL — ABNORMAL HIGH (ref 70–99)
Potassium: 3.7 mmol/L (ref 3.5–5.1)
Sodium: 143 mmol/L (ref 135–145)

## 2022-07-24 LAB — CBC
HCT: 36.2 % — ABNORMAL LOW (ref 39.0–52.0)
Hemoglobin: 11.6 g/dL — ABNORMAL LOW (ref 13.0–17.0)
MCH: 31.4 pg (ref 26.0–34.0)
MCHC: 32 g/dL (ref 30.0–36.0)
MCV: 97.8 fL (ref 80.0–100.0)
Platelets: 134 K/uL — ABNORMAL LOW (ref 150–400)
RBC: 3.7 MIL/uL — ABNORMAL LOW (ref 4.22–5.81)
RDW: 13.9 % (ref 11.5–15.5)
WBC: 6 K/uL (ref 4.0–10.5)
nRBC: 0 % (ref 0.0–0.2)

## 2022-07-24 LAB — SURGICAL PCR SCREEN
MRSA, PCR: NEGATIVE
Staphylococcus aureus: NEGATIVE

## 2022-07-26 DIAGNOSIS — R2681 Unsteadiness on feet: Secondary | ICD-10-CM | POA: Diagnosis not present

## 2022-07-26 DIAGNOSIS — R278 Other lack of coordination: Secondary | ICD-10-CM | POA: Diagnosis not present

## 2022-07-30 ENCOUNTER — Ambulatory Visit: Payer: Medicare Other | Attending: Cardiology | Admitting: Nurse Practitioner

## 2022-07-30 DIAGNOSIS — R2689 Other abnormalities of gait and mobility: Secondary | ICD-10-CM | POA: Diagnosis not present

## 2022-07-30 DIAGNOSIS — M62571 Muscle wasting and atrophy, not elsewhere classified, right ankle and foot: Secondary | ICD-10-CM | POA: Diagnosis not present

## 2022-07-30 DIAGNOSIS — Z01810 Encounter for preprocedural cardiovascular examination: Secondary | ICD-10-CM

## 2022-07-30 DIAGNOSIS — M62572 Muscle wasting and atrophy, not elsewhere classified, left ankle and foot: Secondary | ICD-10-CM | POA: Diagnosis not present

## 2022-07-30 NOTE — Progress Notes (Signed)
Virtual Visit via Telephone Note   Because of [name]'s co-morbid illnesses, he is at least at moderate risk for complications without adequate follow up.  This format is felt to be most appropriate for this patient at this time.  The patient did not have access to video technology/had technical difficulties with video requiring transitioning to audio format only (telephone).  All issues noted in this document were discussed and addressed.  No physical exam could be performed with this format.  Please refer to the patient's chart for his consent to telehealth for [facility].  Evaluation Performed:  Preoperative cardiovascular risk assessment _____________   Date:  07/30/2022   Patient ID:  Gerald Lou., DOB 01-19-1935, MRN [id number] Patient Location:  Home Provider location:   Office  Primary Care Provider:  Jarrett Soho, PA-C Primary Cardiologist:  Donato Schultz, MD  Chief Complaint / Patient Profile   87 y.o. y/o male with a h/o nonobstructive CAD, bioprosthetic AVR in 2012, mitral valve repair and tricuspid valve repair in 2020, permanent atrial fibrillation, pulmonary hypertension, ascending aorta dilation, hypertension and hyperlipidemia who is pending right total knee arthroplasty on 08/09/2022 with Dr. Durene Romans of [facility] and presents today for telephonic preoperative cardiovascular risk assessment.  History of Present Illness    Gerald Broner. is a 87 y.o. male who presents via audio/video conferencing for a telehealth visit today.  Pt was last seen in cardiology clinic on 05/30/2022 by Tereso Newcomer, PA.  At that time Gerald Lou. was doing well. The patient is now pending procedure as outlined above. Since his last visit, he has done well from a cardiac standpoint.   He denies chest pain, palpitations, dyspnea, pnd, orthopnea, n, v, dizziness, syncope, edema, weight gain, or early satiety. All other systems reviewed and  are otherwise negative except as noted above.   Past Medical History    Past Medical History:  Diagnosis Date   Acquired dilation of ascending aorta and aortic root (HCC)    43mm aortic root and 41mm ascending aorta on echo 03/2020   Arthritis    Bladder stones    Borderline diabetes    BPH (benign prostatic hyperplasia)    CAD (coronary artery disease) 12/19/2020   Non-obstructive coronary artery disease at cath in 2019   Complication of anesthesia    problem with voiding after anesthesia,   Coronary artery disease    cardiologist-  dr Rockey Situ gerhart NP--- per cath 06-02-2010 non-obstructive cad pLAD 30-40%   CVA (cerebral vascular accident) (HCC) 10/24/2016   Diverticulosis of colon    Dysrhythmia    afib   Gout    Heart murmur    History of adenomatous polyp of colon    2002-- tubular adenoma   History of aortic insufficiency    severe -- s/p  AVR 08-03-2010   History of small bowel obstruction    02/ 2007 mechanical sbo s/p  surgical intervention;  partial sbo 09/ 2011 and 03-20-2011 resolved without surgical intervention   History of urinary retention    HTN (hypertension)    Other secondary pulmonary hypertension (HCC) 03/20/2022   TTE 03/19/22: EF 65-70, no RWMA, moderate LVH, normal RVSF, severe pulmonary hypertension (RVSP 60.4), severe BAE, normal structure and function of mitral valve repair (mean gradient 9), mild TR, trivial AI, normal structure and function of AVR (mean 15.4), moderate PI, aortic root and ascending aorta 40 mm, RAP 15   Peripheral neuropathy  Persistent atrial fibrillation (HCC)    Pre-diabetes    S/P aortic valve replacement with prosthetic valve 08/03/2010   tissue valve   S/P Maze operation for atrial fibrillation 01/30/2018   Complete bilateral atrial lesion set using bipolar radiofrequency and cryothermy with clipping of LA appendage   S/P MVR (mitral valve repair) 01/30/2018   Complex valvuloplasty including artificial Gore-tex  neochord placement x4 and Carbo medics Annuloflex ring annuloplasty, size 28   S/P patent foramen ovale closure 08/03/2010   at same time AVR   S/P tricuspid valve repair 01/30/2018   Using an MC3 Annuloplasty ring, size 28   Stroke (HCC)    Thrombocytopenia (HCC)    Tricuspid regurgitation    Past Surgical History:  Procedure Laterality Date   BIOPSY  09/07/2018   Procedure: BIOPSY;  Surgeon: Kathi Der, MD;  Location: [facility];  Service: Gastroenterology;;   CARDIAC CATHETERIZATION  06-02-2010  dr Anne Fu   non-obstructive cad- pLAD 30-40%/  normal LVSF/  severe AI   CARDIOVASCULAR STRESS TEST  04/12/2016   Low risk nuclear perfusion study w/ no significant reversible ischemia/  normal LV function and wall motion ,  stress ef 60%/  2mm inferior and lateral scooped ST-segment depression w/ exercise (may be repolarization abnormality), exercise capacity was moderately reduced   CATARACT EXTRACTION W/ INTRAOCULAR LENS  IMPLANT, BILATERAL  02/2010   CLIPPING OF ATRIAL APPENDAGE N/A 01/30/2018   Procedure: CLIPPING OF LEFT ATRIAL APPENDAGE USING ATRICLIP PRO2 ;  Surgeon: Purcell Nails, MD;  Location: [facility] OR;  Service: Open Heart Surgery;  Laterality: N/A;   COLONOSCOPY WITH PROPOFOL N/A 10/21/2018   Procedure: COLONOSCOPY WITH PROPOFOL;  Surgeon: Kathi Der, MD;  Location: [facility];  Service: Gastroenterology;  Laterality: N/A;   CYSTOSCOPY WITH LITHOLAPAXY N/A 06/05/2016   Procedure: CYSTOSCOPY WITH LITHOLAPAXY and fulgarization of bladder neck;  Surgeon: Bjorn Pippin, MD;  Location: [facility];  Service: Urology;  Laterality: N/A;   ESOPHAGOGASTRODUODENOSCOPY (EGD) WITH PROPOFOL N/A 09/07/2018   Procedure: ESOPHAGOGASTRODUODENOSCOPY (EGD) WITH PROPOFOL;  Surgeon: Kathi Der, MD;  Location: [facility];  Service: Gastroenterology;  Laterality: N/A;   EXPLORATORY LAPARTOMY /  CHOLECYSTECTOMY  02/28/2005   for Small  bowel obstruction (mechnical)    IR ANGIO EXTRACRAN SEL COM CAROTID INNOMINATE UNI L MOD SED  10/24/2016   IR ANGIO VERTEBRAL SEL SUBCLAVIAN INNOMINATE BILAT MOD SED  10/24/2016   IR PERCUTANEOUS ART THROMBECTOMY/INFUSION INTRACRANIAL INC DIAG ANGIO  10/24/2016   IR RADIOLOGIST EVAL & MGMT  12/05/2016   LEFT KNEE ARTHROSCOPY  2006   MAZE N/A 01/30/2018   Procedure: MAZE;  Surgeon: Purcell Nails, MD;  Location[facility] OR;  Service: Open Heart Surgery;  Laterality: N/A;   MITRAL VALVE REPAIR N/A 01/30/2018   Procedure: MITRAL VALVE REPAIR (MVR) USING CARBOMEDICS ANNULOFLEX SIZE 28;  Surgeon: Purcell Nails, MD;  Location: [facility] OR;  Service: Open Heart Surgery;  Laterality: N/A;   POLYPECTOMY  10/21/2018   Procedure: POLYPECTOMY;  Surgeon: Kathi Der, MD;  Location: [facility];  Service: Gastroenterology;;   RADIOLOGY WITH ANESTHESIA N/A 10/24/2016   Procedure: RADIOLOGY WITH ANESTHESIA;  Surgeon: Julieanne Cotton, MD;  Location: [facility] OR;  Service: Radiology;  Laterality: N/A;   RIGHT FOOT SURGERY     RIGHT MINIATURE ANTERIOR THORACOTOMY FOR AORTIC VALVE REPLACEMENT AND CLOSURE PATENT FORAMEN OVALE  08-03-2010  DR Neldon Labella Magna-ease pericardial tissue valve (25mm)   RIGHT/LEFT HEART CATH AND CORONARY ANGIOGRAPHY N/A 10/14/2017   Procedure:  RIGHT/LEFT HEART CATH AND CORONARY ANGIOGRAPHY;  Surgeon: Tonny Bollman, MD;  Location: [facility] INVASIVE CV LAB;  Service: Cardiovascular;  Laterality: N/A;   TEE WITHOUT CARDIOVERSION N/A 10/14/2017   Procedure: TRANSESOPHAGEAL ECHOCARDIOGRAM (TEE);  Surgeon: Wendall Stade, MD;  Location: [facility] ENDOSCOPY;  Service: Cardiovascular;  Laterality: N/A;   TOTAL HIP ARTHROPLASTY Left 08/02/2020   Procedure: TOTAL HIP ARTHROPLASTY ANTERIOR APPROACH;  Surgeon: Durene Romans, MD;  Location: [facility];  Service: Orthopedics;  Laterality: Left;   TOTAL KNEE ARTHROPLASTY Left 09/19/2021   Procedure: TOTAL KNEE ARTHROPLASTY;  Surgeon: Durene Romans, MD;  Location: [facility];  Service: Orthopedics;   Laterality: Left;   TRANSTHORACIC ECHOCARDIOGRAM  05/30/2016  dr skains   moderate  LVH ef 60-65%/  bioprothesis aortic valve present ,normal grandient and no AI /  mild MV calcification , moderate MR /  mild PR/ moderate TR/  PASP 6mmHg/ (RA denisty was identified 04-27-2016 echo) and is seen again today, this is likely a promient eustacian ridge, atrium is normal size   TRICUSPID VALVE REPLACEMENT N/A 01/30/2018   Procedure: TRICUSPID VALVE REPAIR USING MC3 SIZE 28;  Surgeon: Purcell Nails, MD;  Location: [facility] OR;  Service: Open Heart Surgery;  Laterality: N/A;    Allergies  Allergies  Allergen Reactions   Sulfa Antibiotics Other (See Comments)    Granulocytosis   Sulfamethoxazole-Trimethoprim     Other Reaction(s): Unknown   Zestril [Lisinopril] Cough    Home Medications    Prior to Admission medications   Medication Sig Start Date End Date Taking? Authorizing Provider  allopurinol (ZYLOPRIM) 300 MG tablet Take 150 mg by mouth every morning.    [provider]  amLODipine (NORVASC) 10 MG tablet TAKE 1 TABLET BY MOUTH DAILY 07/17/22   Jake Bathe, MD  atenolol (TENORMIN) 25 MG tablet Take 0.5 tablets (12.5 mg total) by mouth daily. 12/25/21   Jake Bathe, MD  cholecalciferol (VITAMIN D3) 25 MCG (1000 UNIT) tablet Take 1,000 Units by mouth daily.    [provider]  Cinnamon 500 MG capsule Take 500 mg by mouth every morning.    [provider]  ferrous sulfate 325 (65 FE) MG EC tablet Take 325 mg by mouth 2 (two) times daily. 10/29/19   [provider]  finasteride (PROSCAR) 5 MG tablet Take 1 tablet (5 mg total) by mouth daily. 11/05/16   Angiulli, Mcarthur Rossetti, PA-C  gabapentin (NEURONTIN) 300 MG capsule TAKE 1 CAPSULE BY MOUTH TWICE  DAILY Patient not taking: Reported on 07/23/2022 06/06/22   Ihor Austin, NP  Multiple Vitamins-Minerals (CENTRUM SILVER ADULT 50+) TABS Take 1 tablet by mouth daily.    [provider]  rosuvastatin  (CRESTOR) 10 MG tablet TAKE 1 TABLET BY MOUTH DAILY 07/17/22   Jake Bathe, MD  silodosin (RAPAFLO) 8 MG CAPS capsule Take 8 mg by mouth daily. 11/02/21   [provider]  traZODone (DESYREL) 50 MG tablet Take 50 mg by mouth at bedtime as needed for sleep.    [provider]  warfarin (COUMADIN) 5 MG tablet Take 1 tablet (5 mg total) by mouth daily at 4 PM. Until seen by warfarin clinic. Patient taking differently: Take 5-7.5 mg by mouth See admin instructions. Take 7.5 mg by mouth on Monday and take 5 mg Tuesday-Sunday. 05/24/21   Rolly Salter, MD    Physical Exam    Vital Signs:  Gerald Lou. does not have vital signs available for review today.  Given telephonic nature of communication, physical exam is limited. AAOx3. NAD. Normal affect.  Speech and respirations are unlabored.  Accessory Clinical Findings    None  Assessment & Plan    1.  Preoperative Cardiovascular Risk Assessment:  According to the Revised Cardiac Risk Index (RCRI), his Perioperative Risk of Major Cardiac Event is (%): 0.9. His Functional Capacity in METs is: 6.79 according to the Duke Activity Status Index (DASI).  The patient was advised that if he develops new symptoms prior to surgery to contact our office to arrange for a follow-up visit, and he verbalized understanding.  Per office protocol, patient can hold warfarin for 5 days prior to procedure.   Patient WILL need bridging with Lovenox (enoxaparin) around procedure.   Bridge will be coordinated by our coumadin clinic.   A copy of this note will be routed to requesting surgeon.  Time:   Today, I have spent 5 minutes with the patient with telehealth technology discussing medical history, symptoms, and management plan.     Joylene Grapes, NP  07/30/2022, 9:54 AM

## 2022-07-31 ENCOUNTER — Telehealth: Payer: Self-pay

## 2022-07-31 ENCOUNTER — Ambulatory Visit: Payer: Medicare Other | Admitting: Adult Health

## 2022-07-31 NOTE — Telephone Encounter (Signed)
The patient contacted our office to inform us that their surgery has been scheduled for August 1st, 2024. I assured the patient that I will promptly notify the healthcare provider of the scheduled surgery date.

## 2022-08-01 ENCOUNTER — Ambulatory Visit: Payer: Medicare Other | Attending: Cardiology | Admitting: *Deleted

## 2022-08-01 DIAGNOSIS — Z5181 Encounter for therapeutic drug level monitoring: Secondary | ICD-10-CM | POA: Diagnosis not present

## 2022-08-01 DIAGNOSIS — Z9889 Other specified postprocedural states: Secondary | ICD-10-CM | POA: Diagnosis not present

## 2022-08-01 DIAGNOSIS — Z7901 Long term (current) use of anticoagulants: Secondary | ICD-10-CM

## 2022-08-01 DIAGNOSIS — I639 Cerebral infarction, unspecified: Secondary | ICD-10-CM | POA: Diagnosis not present

## 2022-08-01 DIAGNOSIS — I4891 Unspecified atrial fibrillation: Secondary | ICD-10-CM

## 2022-08-01 LAB — POCT INR: INR: 2.8 (ref 2.0–3.0)

## 2022-08-01 MED ORDER — ENOXAPARIN SODIUM 80 MG/0.8ML IJ SOSY
80.0000 mg | PREFILLED_SYRINGE | Freq: Two times a day (BID) | INTRAMUSCULAR | 1 refills | Status: DC
Start: 2022-08-01 — End: 2022-09-04

## 2022-08-01 NOTE — Patient Instructions (Addendum)
Description   Continue taking warfarin 1 tablet daily except for 1.5 tablets on Mondays.   Stay consistent with greens each week.  Recheck INR in 1 week post knee surgery on 08/09/22/Lovenox bridging.  Please let us know if you have any procedures scheduled.  Coumadin Clinic [phone]     08/03/22: Last dose of warfarin.  08/04/22: No warfarin or enoxaparin (Lovenox).  08/05/22: Inject enoxaparin 80mg  in the fatty abdominal tissue at least 2 inches from the belly button twice a day about 12 hours apart, 8am and 8pm rotate sites. No warfarin.  08/06/22: Inject enoxaparin in the fatty tissue every 12 hours, 8am and 8pm. No warfarin.  08/07/22: Inject enoxaparin in the fatty tissue every 12 hours, 8am and 8pm. No warfarin.  08/08/22: Inject enoxaparin in the fatty tissue in the morning at 8 am (No PM dose). No warfarin.  08/09/22: Procedure Day - No enoxaparin - Resume warfarin in the evening or as directed by doctor (take an extra half tablet with usual dose).  08/10/22: Resume enoxaparin inject in the fatty tissue every 12 hours and take warfarin (take an extra half tablet with usual dose then resume normal dose).  08/11/22: Inject enoxaparin in the fatty tissue every 12 hours and take warfarin.  08/12/22: Inject enoxaparin in the fatty tissue every 12 hours and take warfarin.  08/13/22: Inject enoxaparin in the fatty tissue every 12 hours and take warfarin.  08/14/22: Inject enoxaparin in the fatty tissue every 12 hours and take warfarin.  08/15/22: Inject enoxaparin in the fatty tissue every 12 hours and take warfarin.  08/16/22: Inject enoxaparin in the fatty tissue and warfarin appt to check INR.

## 2022-08-02 DIAGNOSIS — M62572 Muscle wasting and atrophy, not elsewhere classified, left ankle and foot: Secondary | ICD-10-CM | POA: Diagnosis not present

## 2022-08-02 DIAGNOSIS — R2689 Other abnormalities of gait and mobility: Secondary | ICD-10-CM | POA: Diagnosis not present

## 2022-08-02 DIAGNOSIS — M62571 Muscle wasting and atrophy, not elsewhere classified, right ankle and foot: Secondary | ICD-10-CM | POA: Diagnosis not present

## 2022-08-03 DIAGNOSIS — M1711 Unilateral primary osteoarthritis, right knee: Secondary | ICD-10-CM | POA: Diagnosis not present

## 2022-08-03 NOTE — Anesthesia Preprocedure Evaluation (Signed)
Anesthesia Evaluation  Patient identified by MRN, date of birth, ID band Patient awake    Reviewed: Allergy & Precautions, NPO status , Patient's Chart, lab work & pertinent test results, reviewed documented beta blocker date and time   Airway Mallampati: II  TM Distance: >3 FB Neck ROM: Full    Dental no notable dental hx. (+) Teeth Intact, Dental Advisory Given   Pulmonary neg pulmonary ROS   Pulmonary exam normal breath sounds clear to auscultation       Cardiovascular hypertension, Pt. on medications and Pt. on home beta blockers pulmonary hypertension+ CAD and +CHF  Normal cardiovascular exam+ dysrhythmias (coumadin with lovenox bridge) Atrial Fibrillation + Valvular Problems/Murmurs (s/p AVR and PFO closure 2012, s/p MV repair 2020)  Rhythm:Regular Rate:Normal  TTE 2024 1. Left ventricular ejection fraction, by estimation, is 65 to 70%. The  left ventricle has normal function. The left ventricle has no regional  wall motion abnormalities. There is moderate concentric left ventricular  hypertrophy. Left ventricular  diastolic function could not be evaluated. Elevated left ventricular  end-diastolic pressure.   2. Right ventricular systolic function is normal. The right ventricular  size is normal. There is severely elevated pulmonary artery systolic  pressure.   3. Left atrial size was severely dilated.   4. Right atrial size was severely dilated.   5. The mitral valve has been repaired/replaced. Trivial mitral valve  regurgitation. No evidence of mitral stenosis. The mean mitral valve  gradient is 9.0 mmHg. Echo findings are consistent with normal structure  and function of the mitral valve  prosthesis.   6. Tricuspid valve mean inflow gradient 1.72 mmHg. The tricuspid valve is  has been repaired/replaced. The tricuspid valve is status post repair with  an annuloplasty ring.   7. Aortic valve gradient unchanged from  03/2021. The aortic valve has been  repaired/replaced. Aortic valve regurgitation is trivial. No aortic  stenosis is present. Echo findings are consistent with normal structure  and function of the aortic valve  prosthesis. Aortic valve mean gradient measures 15.4 mmHg. Aortic valve  Vmax measures 2.60 m/s.   8. Pulmonic valve regurgitation is moderate.   9. Aortic dilatation noted. There is mild dilatation of the aortic root  and of the ascending aorta, measuring 40 mm.  10. The inferior vena cava is dilated in size with <50% respiratory  variability, suggesting right atrial pressure of 15 mmHg.     Neuro/Psych CVA, No Residual Symptoms  negative psych ROS   GI/Hepatic negative GI ROS, Neg liver ROS,,,  Endo/Other  negative endocrine ROS    Renal/GU negative Renal ROS  negative genitourinary   Musculoskeletal  (+) Arthritis ,    Abdominal   Peds  Hematology  (+) Blood dyscrasia, anemia   Anesthesia Other Findings   Reproductive/Obstetrics                             Anesthesia Physical Anesthesia Plan  ASA: 3  Anesthesia Plan: Regional and General   Post-op Pain Management: Regional block* and Tylenol PO (pre-op)*   Induction:   PONV Risk Score and Plan: 2 and Treatment may vary due to age or medical condition, Ondansetron and Dexamethasone  Airway Management Planned: Natural Airway and LMA  Additional Equipment:   Intra-op Plan:   Post-operative Plan:   Informed Consent: I have reviewed the patients History and Physical, chart, labs and discussed the procedure including the risks, benefits and alternatives for  the proposed anesthesia with the patient or authorized representative who has indicated his/her understanding and acceptance.     Dental advisory given  Plan Discussed with: CRNA  Anesthesia Plan Comments: (See PAT note 08/03/2022  Pt requests GA 2/2 urinary retention after previous spinal anesthetics)        Anesthesia Quick Evaluation

## 2022-08-03 NOTE — Progress Notes (Signed)
Anesthesia Chart Review   Case: [id number] Date/Time: 08/09/22 0957   Procedure: TOTAL KNEE ARTHROPLASTY (Right: Knee) - 90   Anesthesia type: Spinal   Pre-op diagnosis: Right knee osteoarthritis   Location: [facility] 09 / [facility]   Surgeons: Durene Romans, MD       DISCUSSION:87 y.o. never smoker with h/o HTN, CAD, atrial fibrillation s/p MAZE, s/p AV replacement 2012, mitral valve repair and tricuspid valve repair in 2020, right knee OA scheduled for above procedure 08/09/2022 with Dr. Durene Romans.   Pt last seen by cardiology 07/30/22. Per OV note, "According to the Revised Cardiac Risk Index (RCRI), his Perioperative Risk of Major Cardiac Event is (%): 0.9. His Functional Capacity in METs is: 6.79 according to the Duke Activity Status Index (DASI). The patient was advised that if he develops new symptoms prior to surgery to contact our office to arrange for a follow-up visit, and he verbalized understanding. Per office protocol, patient can hold warfarin for 5 days prior to procedure.   Patient WILL need bridging with Lovenox (enoxaparin) around procedure. Bridge will be coordinated by our coumadin clinic."  Lovenox bridge instructions outlined for patient in 08/01/2022 note.   VS: There were no vitals taken for this visit.  PROVIDERS: Jarrett Soho, PA-C is PCP   Primary Cardiologist:  Donato Schultz, MD  LABS: Labs reviewed: Acceptable for surgery. (all labs ordered are listed, but only abnormal results are displayed)  Labs Reviewed - No data to display   IMAGES:   EKG:   CV: Echo 03/19/22 1. Left ventricular ejection fraction, by estimation, is 65 to 70%. The  left ventricle has normal function. The left ventricle has no regional  wall motion abnormalities. There is moderate concentric left ventricular  hypertrophy. Left ventricular  diastolic function could not be evaluated. Elevated left ventricular  end-diastolic pressure.   2. Right ventricular systolic function  is normal. The right ventricular  size is normal. There is severely elevated pulmonary artery systolic  pressure.   3. Left atrial size was severely dilated.   4. Right atrial size was severely dilated.   5. The mitral valve has been repaired/replaced. Trivial mitral valve  regurgitation. No evidence of mitral stenosis. The mean mitral valve  gradient is 9.0 mmHg. Echo findings are consistent with normal structure  and function of the mitral valve  prosthesis.   6. Tricuspid valve mean inflow gradient 1.72 mmHg. The tricuspid valve is  has been repaired/replaced. The tricuspid valve is status post repair with  an annuloplasty ring.   7. Aortic valve gradient unchanged from 03/2021. The aortic valve has been  repaired/replaced. Aortic valve regurgitation is trivial. No aortic  stenosis is present. Echo findings are consistent with normal structure  and function of the aortic valve  prosthesis. Aortic valve mean gradient measures 15.4 mmHg. Aortic valve  Vmax measures 2.60 m/s.   8. Pulmonic valve regurgitation is moderate.   9. Aortic dilatation noted. There is mild dilatation of the aortic root  and of the ascending aorta, measuring 40 mm.  10. The inferior vena cava is dilated in size with <50% respiratory  variability, suggesting right atrial pressure of 15 mmHg.   Myocardial Perfusion 04/12/2016 Nuclear stress EF: 60%. Blood pressure demonstrated a normal response to exercise. Horizontal ST segment depression ST segment depression of 2 mm was noted during stress in the II, III, aVF, V6, V5 and V4 leads. This is a low risk study. ST segment depression of 2 mm was noted  during stress in the II, III, aVF, V6, V5 and V4 leads. The test was stopped because the patient complained of fatigue and shortness of breath Overall, the patient's exercise capacity was moderately impaired Duke Treadmill Score: intermediate risk   No significant reversible ischemia. LVEF 60% with normal wall motion.  2 mm inferior and lateral scooped ST-segment depression with exercise, may be repolarization abnormality. Exercise capacity was moderately reduced. Overall, however, this is a low risk study.   Past Medical History:  Diagnosis Date   Acquired dilation of ascending aorta and aortic root (HCC)    43mm aortic root and 41mm ascending aorta on echo 03/2020   Arthritis    Bladder stones    Borderline diabetes    BPH (benign prostatic hyperplasia)    CAD (coronary artery disease) 12/19/2020   Non-obstructive coronary artery disease at cath in 2019   Complication of anesthesia    problem with voiding after anesthesia,   Coronary artery disease    cardiologist-  dr Rockey Situ gerhart NP--- per cath 06-02-2010 non-obstructive cad pLAD 30-40%   CVA (cerebral vascular accident) (HCC) 10/24/2016   Diverticulosis of colon    Dysrhythmia    afib   Gout    Heart murmur    History of adenomatous polyp of colon    2002-- tubular adenoma   History of aortic insufficiency    severe -- s/p  AVR 08-03-2010   History of small bowel obstruction    02/ 2007 mechanical sbo s/p  surgical intervention;  partial sbo 09/ 2011 and 03-20-2011 resolved without surgical intervention   History of urinary retention    HTN (hypertension)    Other secondary pulmonary hypertension (HCC) 03/20/2022   TTE 03/19/22: EF 65-70, no RWMA, moderate LVH, normal RVSF, severe pulmonary hypertension (RVSP 60.4), severe BAE, normal structure and function of mitral valve repair (mean gradient 9), mild TR, trivial AI, normal structure and function of AVR (mean 15.4), moderate PI, aortic root and ascending aorta 40 mm, RAP 15   Peripheral neuropathy    Persistent atrial fibrillation (HCC)    Pre-diabetes    S/P aortic valve replacement with prosthetic valve 08/03/2010   tissue valve   S/P Maze operation for atrial fibrillation 01/30/2018   Complete bilateral atrial lesion set using bipolar radiofrequency and cryothermy with  clipping of LA appendage   S/P MVR (mitral valve repair) 01/30/2018   Complex valvuloplasty including artificial Gore-tex neochord placement x4 and Carbo medics Annuloflex ring annuloplasty, size 28   S/P patent foramen ovale closure 08/03/2010   at same time AVR   S/P tricuspid valve repair 01/30/2018   Using an MC3 Annuloplasty ring, size 28   Stroke (HCC)    Thrombocytopenia (HCC)    Tricuspid regurgitation     Past Surgical History:  Procedure Laterality Date   BIOPSY  09/07/2018   Procedure: BIOPSY;  Surgeon: Kathi Der, MD;  Location: [facility];  Service: Gastroenterology;;   CARDIAC CATHETERIZATION  06-02-2010  dr Anne Fu   non-obstructive cad- pLAD 30-40%/  normal LVSF/  severe AI   CARDIOVASCULAR STRESS TEST  04/12/2016   Low risk nuclear perfusion study w/ no significant reversible ischemia/  normal LV function and wall motion ,  stress ef 60%/  2mm inferior and lateral scooped ST-segment depression w/ exercise (may be repolarization abnormality), exercise capacity was moderately reduced   CATARACT EXTRACTION W/ INTRAOCULAR LENS  IMPLANT, BILATERAL  02/2010   CLIPPING OF ATRIAL APPENDAGE N/A 01/30/2018   Procedure: CLIPPING  OF LEFT ATRIAL APPENDAGE USING ATRICLIP PRO2 ;  Surgeon: Purcell Nails, MD;  Location: [facility];  Service: Open Heart Surgery;  Laterality: N/A;   COLONOSCOPY WITH PROPOFOL N/A 10/21/2018   Procedure: COLONOSCOPY WITH PROPOFOL;  Surgeon: Kathi Der, MD;  Location: [facility];  Service: Gastroenterology;  Laterality: N/A;   CYSTOSCOPY WITH LITHOLAPAXY N/A 06/05/2016   Procedure: CYSTOSCOPY WITH LITHOLAPAXY and fulgarization of bladder neck;  Surgeon: Bjorn Pippin, MD;  Location: [facility];  Service: Urology;  Laterality: N/A;   ESOPHAGOGASTRODUODENOSCOPY (EGD) WITH PROPOFOL N/A 09/07/2018   Procedure: ESOPHAGOGASTRODUODENOSCOPY (EGD) WITH PROPOFOL;  Surgeon: Kathi Der, MD;  Location: [facility];  Service:  Gastroenterology;  Laterality: N/A;   EXPLORATORY LAPARTOMY /  CHOLECYSTECTOMY  02/28/2005   for Small  bowel obstruction (mechnical)   IR ANGIO EXTRACRAN SEL COM CAROTID INNOMINATE UNI L MOD SED  10/24/2016   IR ANGIO VERTEBRAL SEL SUBCLAVIAN INNOMINATE BILAT MOD SED  10/24/2016   IR PERCUTANEOUS ART THROMBECTOMY/INFUSION INTRACRANIAL INC DIAG ANGIO  10/24/2016   IR RADIOLOGIST EVAL & MGMT  12/05/2016   LEFT KNEE ARTHROSCOPY  2006   MAZE N/A 01/30/2018   Procedure: MAZE;  Surgeon: Purcell Nails, MD;  Location: [facility] OR;  Service: Open Heart Surgery;  Laterality: N/A;   MITRAL VALVE REPAIR N/A 01/30/2018   Procedure: MITRAL VALVE REPAIR (MVR) USING CARBOMEDICS ANNULOFLEX SIZE 28;  Surgeon: Purcell Nails, MD;  Location: [facility] OR;  Service: Open Heart Surgery;  Laterality: N/A;   POLYPECTOMY  10/21/2018   Procedure: POLYPECTOMY;  Surgeon: Kathi Der, MD;  Location: [facility];  Service: Gastroenterology;;   RADIOLOGY WITH ANESTHESIA N/A 10/24/2016   Procedure: RADIOLOGY WITH ANESTHESIA;  Surgeon: Julieanne Cotton, MD;  Location: [facility] OR;  Service: Radiology;  Laterality: N/A;   RIGHT FOOT SURGERY     RIGHT MINIATURE ANTERIOR THORACOTOMY FOR AORTIC VALVE REPLACEMENT AND CLOSURE PATENT FORAMEN OVALE  08-03-2010  DR Neldon Labella Magna-ease pericardial tissue valve (25mm)   RIGHT/LEFT HEART CATH AND CORONARY ANGIOGRAPHY N/A 10/14/2017   Procedure: RIGHT/LEFT HEART CATH AND CORONARY ANGIOGRAPHY;  Surgeon: Tonny Bollman, MD;  Location: [facility] INVASIVE CV LAB;  Service: Cardiovascular;  Laterality: N/A;   TEE WITHOUT CARDIOVERSION N/A 10/14/2017   Procedure: TRANSESOPHAGEAL ECHOCARDIOGRAM (TEE);  Surgeon: Wendall Stade, MD;  Location: [facility] ENDOSCOPY;  Service: Cardiovascular;  Laterality: N/A;   TOTAL HIP ARTHROPLASTY Left 08/02/2020   Procedure: TOTAL HIP ARTHROPLASTY ANTERIOR APPROACH;  Surgeon: Durene Romans, MD;  Location: [facility];  Service: Orthopedics;  Laterality: Left;   TOTAL KNEE  ARTHROPLASTY Left 09/19/2021   Procedure: TOTAL KNEE ARTHROPLASTY;  Surgeon: Durene Romans, MD;  Location: [facility];  Service: Orthopedics;  Laterality: Left;   TRANSTHORACIC ECHOCARDIOGRAM  05/30/2016  dr skains   moderate  LVH ef 60-65%/  bioprothesis aortic valve present ,normal grandient and no AI /  mild MV calcification , moderate MR /  mild PR/ moderate TR/  PASP 45mmHg/ (RA denisty was identified 04-27-2016 echo) and is seen again today, this is likely a promient eustacian ridge, atrium is normal size   TRICUSPID VALVE REPLACEMENT N/A 01/30/2018   Procedure: TRICUSPID VALVE REPAIR USING MC3 SIZE 28;  Surgeon: Purcell Nails, MD;  Location: [facility] OR;  Service: Open Heart Surgery;  Laterality: N/A;    MEDICATIONS: No current facility-administered medications for this encounter.    allopurinol (ZYLOPRIM) 300 MG tablet   amLODipine (NORVASC) 10 MG tablet   atenolol (TENORMIN) 25 MG tablet  cholecalciferol (VITAMIN D3) 25 MCG (1000 UNIT) tablet   Cinnamon 500 MG capsule   ferrous sulfate 325 (65 FE) MG EC tablet   finasteride (PROSCAR) 5 MG tablet   Multiple Vitamins-Minerals (CENTRUM SILVER ADULT 50+) TABS   rosuvastatin (CRESTOR) 10 MG tablet   silodosin (RAPAFLO) 8 MG CAPS capsule   traZODone (DESYREL) 50 MG tablet   warfarin (COUMADIN) 5 MG tablet   enoxaparin (LOVENOX) 80 MG/0.8ML injection   gabapentin (NEURONTIN) 300 MG capsule   [facility] Ward, PA-C [facility] Pre-Surgical Testing [phone]

## 2022-08-03 NOTE — Progress Notes (Signed)
Plan for general anesthesia on 8/1 due to severe urinary retention with past two surgeries and spinal anesthesia.  NO FOLEY   Rosalene Billings, PA-C Orthopedic Surgery [facility] [place] [phone]

## 2022-08-09 ENCOUNTER — Other Ambulatory Visit: Payer: Self-pay

## 2022-08-09 ENCOUNTER — Encounter (HOSPITAL_COMMUNITY): Payer: Self-pay | Admitting: Orthopedic Surgery

## 2022-08-09 ENCOUNTER — Encounter (HOSPITAL_COMMUNITY)
Admission: RE | Disposition: A | Discharge status: Home Health | Payer: Self-pay | Source: Ambulatory Visit | Attending: Orthopedic Surgery

## 2022-08-09 ENCOUNTER — Ambulatory Visit (HOSPITAL_BASED_OUTPATIENT_CLINIC_OR_DEPARTMENT_OTHER): Payer: Medicare Other | Admitting: Physician Assistant

## 2022-08-09 ENCOUNTER — Observation Stay (HOSPITAL_COMMUNITY)
Admission: RE | Admit: 2022-08-09 | Discharge: 2022-08-10 | Disposition: A | Discharge status: Home Health | Payer: Medicare Other | Source: Ambulatory Visit | Attending: Orthopedic Surgery | Admitting: Orthopedic Surgery

## 2022-08-09 ENCOUNTER — Ambulatory Visit (HOSPITAL_COMMUNITY): Payer: Medicare Other | Admitting: Physician Assistant

## 2022-08-09 DIAGNOSIS — D6859 Other primary thrombophilia: Secondary | ICD-10-CM

## 2022-08-09 DIAGNOSIS — Z79899 Other long term (current) drug therapy: Secondary | ICD-10-CM | POA: Diagnosis not present

## 2022-08-09 DIAGNOSIS — I4821 Permanent atrial fibrillation: Secondary | ICD-10-CM | POA: Insufficient documentation

## 2022-08-09 DIAGNOSIS — Z01818 Encounter for other preprocedural examination: Secondary | ICD-10-CM

## 2022-08-09 DIAGNOSIS — I509 Heart failure, unspecified: Secondary | ICD-10-CM | POA: Insufficient documentation

## 2022-08-09 DIAGNOSIS — I2510 Atherosclerotic heart disease of native coronary artery without angina pectoris: Secondary | ICD-10-CM | POA: Insufficient documentation

## 2022-08-09 DIAGNOSIS — G8918 Other acute postprocedural pain: Secondary | ICD-10-CM | POA: Diagnosis not present

## 2022-08-09 DIAGNOSIS — I38 Endocarditis, valve unspecified: Secondary | ICD-10-CM

## 2022-08-09 DIAGNOSIS — Z96652 Presence of left artificial knee joint: Secondary | ICD-10-CM | POA: Diagnosis not present

## 2022-08-09 DIAGNOSIS — Z85828 Personal history of other malignant neoplasm of skin: Secondary | ICD-10-CM | POA: Insufficient documentation

## 2022-08-09 DIAGNOSIS — Z8673 Personal history of transient ischemic attack (TIA), and cerebral infarction without residual deficits: Secondary | ICD-10-CM | POA: Insufficient documentation

## 2022-08-09 DIAGNOSIS — Z96651 Presence of right artificial knee joint: Principal | ICD-10-CM

## 2022-08-09 DIAGNOSIS — I110 Hypertensive heart disease with heart failure: Secondary | ICD-10-CM

## 2022-08-09 DIAGNOSIS — Z96642 Presence of left artificial hip joint: Secondary | ICD-10-CM | POA: Diagnosis not present

## 2022-08-09 DIAGNOSIS — Z8616 Personal history of COVID-19: Secondary | ICD-10-CM | POA: Diagnosis not present

## 2022-08-09 DIAGNOSIS — Z7901 Long term (current) use of anticoagulants: Secondary | ICD-10-CM | POA: Insufficient documentation

## 2022-08-09 DIAGNOSIS — M1711 Unilateral primary osteoarthritis, right knee: Principal | ICD-10-CM | POA: Insufficient documentation

## 2022-08-09 HISTORY — PX: TOTAL KNEE ARTHROPLASTY: SHX125

## 2022-08-09 LAB — PROTIME-INR
INR: 1.2 (ref 0.8–1.2)
Prothrombin Time: 15.5 seconds — ABNORMAL HIGH (ref 11.4–15.2)

## 2022-08-09 SURGERY — ARTHROPLASTY, KNEE, TOTAL
Anesthesia: Regional | Site: Knee | Laterality: Right

## 2022-08-09 MED ORDER — METOCLOPRAMIDE HCL 5 MG/ML IJ SOLN: 5.0000 mg | Freq: Three times a day (TID) | INTRAMUSCULAR | Status: DC | PRN

## 2022-08-09 MED ORDER — PHENYLEPHRINE HCL-NACL 20-0.9 MG/250ML-% IV SOLN
INTRAVENOUS | Status: DC | PRN
  Administered 2022-08-09: 25 ug/min via INTRAVENOUS

## 2022-08-09 MED ORDER — FERROUS SULFATE 325 (65 FE) MG PO TABS
325.0000 mg | ORAL_TABLET | Freq: Two times a day (BID) | ORAL | Status: DC
  Administered 2022-08-09 – 2022-08-10 (×2): 325 mg via ORAL
  Filled 2022-08-09 (×2): qty 1

## 2022-08-09 MED ORDER — CHLORHEXIDINE GLUCONATE 0.12 % MT SOLN
15.0000 mL | Freq: Once | OROMUCOSAL | Status: AC
  Administered 2022-08-09: 15 mL via OROMUCOSAL

## 2022-08-09 MED ORDER — AMLODIPINE BESYLATE 10 MG PO TABS
10.0000 mg | ORAL_TABLET | Freq: Every day | ORAL | Status: DC
  Administered 2022-08-10: 10 mg via ORAL
  Filled 2022-08-09: qty 1

## 2022-08-09 MED ORDER — PROPOFOL 10 MG/ML IV BOLUS
INTRAVENOUS | Status: AC
  Filled 2022-08-09: qty 20

## 2022-08-09 MED ORDER — OXYCODONE HCL 5 MG PO TABS: 10.0000 mg | ORAL_TABLET | ORAL | Status: DC | PRN

## 2022-08-09 MED ORDER — KETOROLAC TROMETHAMINE 30 MG/ML IJ SOLN
INTRAMUSCULAR | Status: DC | PRN
  Administered 2022-08-09: 30 mg

## 2022-08-09 MED ORDER — METHOCARBAMOL 500 MG PO TABS: 500.0000 mg | ORAL_TABLET | Freq: Four times a day (QID) | ORAL | Status: DC | PRN

## 2022-08-09 MED ORDER — ACETAMINOPHEN 500 MG PO TABS
1000.0000 mg | ORAL_TABLET | Freq: Four times a day (QID) | ORAL | Status: DC
  Administered 2022-08-09 – 2022-08-10 (×3): 1000 mg via ORAL
  Filled 2022-08-09 (×3): qty 2

## 2022-08-09 MED ORDER — PHENYLEPHRINE 80 MCG/ML (10ML) SYRINGE FOR IV PUSH (FOR BLOOD PRESSURE SUPPORT)
PREFILLED_SYRINGE | INTRAVENOUS | Status: AC
  Filled 2022-08-09: qty 20

## 2022-08-09 MED ORDER — BUPIVACAINE-EPINEPHRINE 0.25% -1:200000 IJ SOLN
INTRAMUSCULAR | Status: AC
  Filled 2022-08-09: qty 1

## 2022-08-09 MED ORDER — TRAZODONE HCL 50 MG PO TABS: 50.0000 mg | ORAL_TABLET | Freq: Every evening | ORAL | Status: DC | PRN

## 2022-08-09 MED ORDER — ONDANSETRON HCL 4 MG PO TABS: 4.0000 mg | ORAL_TABLET | Freq: Four times a day (QID) | ORAL | Status: DC | PRN

## 2022-08-09 MED ORDER — ONDANSETRON HCL 4 MG/2ML IJ SOLN: 4.0000 mg | Freq: Four times a day (QID) | INTRAMUSCULAR | Status: DC | PRN

## 2022-08-09 MED ORDER — FENTANYL CITRATE PF 50 MCG/ML IJ SOSY: 25.0000 ug | PREFILLED_SYRINGE | INTRAMUSCULAR | Status: DC | PRN

## 2022-08-09 MED ORDER — ALLOPURINOL 300 MG PO TABS
150.0000 mg | ORAL_TABLET | Freq: Every morning | ORAL | Status: DC
  Administered 2022-08-09 – 2022-08-10 (×2): 150 mg via ORAL
  Filled 2022-08-09 (×2): qty 1

## 2022-08-09 MED ORDER — PHENOL 1.4 % MT LIQD: 1.0000 | OROMUCOSAL | Status: DC | PRN

## 2022-08-09 MED ORDER — FENTANYL CITRATE (PF) 250 MCG/5ML IJ SOLN
INTRAMUSCULAR | Status: AC
  Filled 2022-08-09: qty 5

## 2022-08-09 MED ORDER — PHENYLEPHRINE 80 MCG/ML (10ML) SYRINGE FOR IV PUSH (FOR BLOOD PRESSURE SUPPORT)
PREFILLED_SYRINGE | INTRAVENOUS | Status: DC | PRN
  Administered 2022-08-09 (×2): 160 ug via INTRAVENOUS
  Administered 2022-08-09 (×2): 80 ug via INTRAVENOUS
  Administered 2022-08-09: 160 ug via INTRAVENOUS

## 2022-08-09 MED ORDER — ORAL CARE MOUTH RINSE: 15.0000 mL | Freq: Once | OROMUCOSAL | Status: AC

## 2022-08-09 MED ORDER — LIDOCAINE 2% (20 MG/ML) 5 ML SYRINGE
INTRAMUSCULAR | Status: DC | PRN
  Administered 2022-08-09: 80 mg via INTRAVENOUS

## 2022-08-09 MED ORDER — SODIUM CHLORIDE 0.9 % IV SOLN: INTRAVENOUS | Status: DC

## 2022-08-09 MED ORDER — DEXAMETHASONE SODIUM PHOSPHATE 10 MG/ML IJ SOLN
8.0000 mg | Freq: Once | INTRAMUSCULAR | Status: AC
  Administered 2022-08-09: 8 mg via INTRAVENOUS

## 2022-08-09 MED ORDER — NON FORMULARY
8.0000 mg | Freq: Every day | Status: DC
Start: 2022-08-09 — End: 2022-08-09

## 2022-08-09 MED ORDER — BUPIVACAINE-EPINEPHRINE (PF) 0.25% -1:200000 IJ SOLN
INTRAMUSCULAR | Status: DC | PRN
  Administered 2022-08-09: 30 mL

## 2022-08-09 MED ORDER — BISACODYL 10 MG RE SUPP: 10.0000 mg | Freq: Every day | RECTAL | Status: DC | PRN

## 2022-08-09 MED ORDER — FENTANYL CITRATE PF 50 MCG/ML IJ SOSY
50.0000 ug | PREFILLED_SYRINGE | INTRAMUSCULAR | Status: AC
  Administered 2022-08-09: 50 ug via INTRAVENOUS
  Filled 2022-08-09: qty 2

## 2022-08-09 MED ORDER — DEXAMETHASONE SODIUM PHOSPHATE 10 MG/ML IJ SOLN
INTRAMUSCULAR | Status: DC | PRN
Start: 2022-08-09 — End: 2022-08-09
  Administered 2022-08-09: 10 mg

## 2022-08-09 MED ORDER — LACTATED RINGERS IV SOLN: INTRAVENOUS | Status: DC

## 2022-08-09 MED ORDER — METOCLOPRAMIDE HCL 5 MG PO TABS: 5.0000 mg | ORAL_TABLET | Freq: Three times a day (TID) | ORAL | Status: DC | PRN

## 2022-08-09 MED ORDER — FINASTERIDE 5 MG PO TABS
5.0000 mg | ORAL_TABLET | Freq: Every day | ORAL | Status: DC
  Administered 2022-08-10: 5 mg via ORAL
  Filled 2022-08-09: qty 1

## 2022-08-09 MED ORDER — ROPIVACAINE HCL 5 MG/ML IJ SOLN
INTRAMUSCULAR | Status: DC | PRN
  Administered 2022-08-09: 20 mL via PERINEURAL

## 2022-08-09 MED ORDER — 0.9 % SODIUM CHLORIDE (POUR BTL) OPTIME
TOPICAL | Status: DC | PRN
  Administered 2022-08-09: 1000 mL

## 2022-08-09 MED ORDER — TRANEXAMIC ACID-NACL 1000-0.7 MG/100ML-% IV SOLN
1000.0000 mg | Freq: Once | INTRAVENOUS | Status: AC
  Administered 2022-08-09: 1000 mg via INTRAVENOUS
  Filled 2022-08-09: qty 100

## 2022-08-09 MED ORDER — FENTANYL CITRATE (PF) 100 MCG/2ML IJ SOLN
INTRAMUSCULAR | Status: DC | PRN
  Administered 2022-08-09 (×4): 50 ug via INTRAVENOUS

## 2022-08-09 MED ORDER — METHOCARBAMOL 1000 MG/10ML IJ SOLN: 500.0000 mg | Freq: Four times a day (QID) | INTRAVENOUS | Status: DC | PRN

## 2022-08-09 MED ORDER — POLYETHYLENE GLYCOL 3350 17 G PO PACK
17.0000 g | PACK | Freq: Two times a day (BID) | ORAL | Status: DC
  Administered 2022-08-09 – 2022-08-10 (×3): 17 g via ORAL
  Filled 2022-08-09 (×3): qty 1

## 2022-08-09 MED ORDER — ONDANSETRON HCL 4 MG/2ML IJ SOLN
INTRAMUSCULAR | Status: DC | PRN
  Administered 2022-08-09: 4 mg via INTRAVENOUS

## 2022-08-09 MED ORDER — ROSUVASTATIN CALCIUM 10 MG PO TABS
10.0000 mg | ORAL_TABLET | Freq: Every day | ORAL | Status: DC
  Administered 2022-08-09 – 2022-08-10 (×2): 10 mg via ORAL
  Filled 2022-08-09 (×2): qty 1

## 2022-08-09 MED ORDER — ENOXAPARIN SODIUM 80 MG/0.8ML IJ SOSY: 80.0000 mg | PREFILLED_SYRINGE | Freq: Two times a day (BID) | INTRAMUSCULAR | Status: DC

## 2022-08-09 MED ORDER — DEXAMETHASONE SODIUM PHOSPHATE 10 MG/ML IJ SOLN
INTRAMUSCULAR | Status: AC
  Filled 2022-08-09: qty 1

## 2022-08-09 MED ORDER — MENTHOL 3 MG MT LOZG: 1.0000 | LOZENGE | OROMUCOSAL | Status: DC | PRN

## 2022-08-09 MED ORDER — PROPOFOL 10 MG/ML IV BOLUS
INTRAVENOUS | Status: DC | PRN
  Administered 2022-08-09: 100 mg via INTRAVENOUS

## 2022-08-09 MED ORDER — ATENOLOL 25 MG PO TABS
12.5000 mg | ORAL_TABLET | Freq: Every day | ORAL | Status: DC
  Administered 2022-08-10: 12.5 mg via ORAL
  Filled 2022-08-09: qty 1

## 2022-08-09 MED ORDER — PHENYLEPHRINE HCL-NACL 20-0.9 MG/250ML-% IV SOLN
INTRAVENOUS | Status: AC
  Filled 2022-08-09: qty 250

## 2022-08-09 MED ORDER — STERILE WATER FOR IRRIGATION IR SOLN
Status: DC | PRN
  Administered 2022-08-09: 2000 mL

## 2022-08-09 MED ORDER — CEFAZOLIN SODIUM-DEXTROSE 2-4 GM/100ML-% IV SOLN
2.0000 g | INTRAVENOUS | Status: AC
  Administered 2022-08-09: 2 g via INTRAVENOUS
  Filled 2022-08-09: qty 100

## 2022-08-09 MED ORDER — SODIUM CHLORIDE (PF) 0.9 % IJ SOLN
INTRAMUSCULAR | Status: AC
  Filled 2022-08-09: qty 50

## 2022-08-09 MED ORDER — ACETAMINOPHEN 500 MG PO TABS
1000.0000 mg | ORAL_TABLET | Freq: Once | ORAL | Status: AC
  Administered 2022-08-09: 1000 mg via ORAL
  Filled 2022-08-09: qty 2

## 2022-08-09 MED ORDER — WARFARIN SODIUM 5 MG PO TABS
7.5000 mg | ORAL_TABLET | Freq: Once | ORAL | Status: AC
  Administered 2022-08-09: 7.5 mg via ORAL
  Filled 2022-08-09: qty 1

## 2022-08-09 MED ORDER — KETOROLAC TROMETHAMINE 30 MG/ML IJ SOLN
INTRAMUSCULAR | Status: AC
  Filled 2022-08-09: qty 1

## 2022-08-09 MED ORDER — OXYCODONE HCL 5 MG PO TABS
2.5000 mg | ORAL_TABLET | ORAL | Status: DC | PRN
  Administered 2022-08-09 (×2): 5 mg via ORAL
  Filled 2022-08-09 (×2): qty 1

## 2022-08-09 MED ORDER — DOCUSATE SODIUM 100 MG PO CAPS
100.0000 mg | ORAL_CAPSULE | Freq: Two times a day (BID) | ORAL | Status: DC
  Administered 2022-08-09 – 2022-08-10 (×3): 100 mg via ORAL
  Filled 2022-08-09 (×3): qty 1

## 2022-08-09 MED ORDER — DIPHENHYDRAMINE HCL 12.5 MG/5ML PO ELIX: 12.5000 mg | ORAL_SOLUTION | ORAL | Status: DC | PRN

## 2022-08-09 MED ORDER — CEFAZOLIN SODIUM-DEXTROSE 2-4 GM/100ML-% IV SOLN
2.0000 g | Freq: Four times a day (QID) | INTRAVENOUS | Status: AC
  Administered 2022-08-09 (×2): 2 g via INTRAVENOUS
  Filled 2022-08-09 (×2): qty 100

## 2022-08-09 MED ORDER — SODIUM CHLORIDE (PF) 0.9 % IJ SOLN
INTRAMUSCULAR | Status: DC | PRN
  Administered 2022-08-09: 30 mL

## 2022-08-09 MED ORDER — SODIUM CHLORIDE 0.9 % IR SOLN
Status: DC | PRN
  Administered 2022-08-09: 1000 mL

## 2022-08-09 MED ORDER — TAMSULOSIN HCL 0.4 MG PO CAPS
0.4000 mg | ORAL_CAPSULE | Freq: Every day | ORAL | Status: DC
  Administered 2022-08-09: 0.4 mg via ORAL
  Filled 2022-08-09: qty 1

## 2022-08-09 MED ORDER — ENOXAPARIN SODIUM 80 MG/0.8ML IJ SOSY
80.0000 mg | PREFILLED_SYRINGE | Freq: Two times a day (BID) | INTRAMUSCULAR | Status: DC
  Administered 2022-08-10: 80 mg via SUBCUTANEOUS
  Filled 2022-08-09: qty 0.8

## 2022-08-09 MED ORDER — WARFARIN - PHARMACIST DOSING INPATIENT: Freq: Every day | Status: DC

## 2022-08-09 MED ORDER — POVIDONE-IODINE 10 % EX SWAB: 2.0000 | Freq: Once | CUTANEOUS | Status: AC

## 2022-08-09 MED ORDER — DEXAMETHASONE SODIUM PHOSPHATE 10 MG/ML IJ SOLN
10.0000 mg | Freq: Once | INTRAMUSCULAR | Status: AC
  Administered 2022-08-10: 10 mg via INTRAVENOUS
  Filled 2022-08-09: qty 1

## 2022-08-09 MED ORDER — TRANEXAMIC ACID-NACL 1000-0.7 MG/100ML-% IV SOLN
1000.0000 mg | INTRAVENOUS | Status: AC
  Administered 2022-08-09: 1000 mg via INTRAVENOUS
  Filled 2022-08-09: qty 100

## 2022-08-09 MED ORDER — LIDOCAINE HCL (PF) 2 % IJ SOLN
INTRAMUSCULAR | Status: AC
  Filled 2022-08-09: qty 5

## 2022-08-09 MED ORDER — PROPOFOL 500 MG/50ML IV EMUL
INTRAVENOUS | Status: AC
  Filled 2022-08-09: qty 50

## 2022-08-09 MED ORDER — ONDANSETRON HCL 4 MG/2ML IJ SOLN
INTRAMUSCULAR | Status: AC
  Filled 2022-08-09: qty 2

## 2022-08-09 MED ORDER — HYDROMORPHONE HCL 1 MG/ML IJ SOLN: 0.5000 mg | INTRAMUSCULAR | Status: DC | PRN

## 2022-08-09 SURGICAL SUPPLY — 58 items
ADH SKN CLS APL DERMABOND .7 (GAUZE/BANDAGES/DRESSINGS) ×1
ATTUNE MED ANAT PAT 38 KNEE (Knees) IMPLANT
BAG COUNTER SPONGE SURGICOUNT (BAG) IMPLANT
BAG SPEC THK2 15X12 ZIP CLS (MISCELLANEOUS)
BAG SPNG CNTER NS LX DISP (BAG)
BAG ZIPLOCK 12X15 (MISCELLANEOUS) IMPLANT
BASE TIBIAL CEM ATTUNE SZ 7 (Knees) ×1 IMPLANT
BASEPLATE TIB CEM ATTUNE SZ7 (Knees) IMPLANT
BLADE SAW SGTL 13.0X1.19X90.0M (BLADE) ×1 IMPLANT
BNDG CMPR 6 X 5 YARDS HK CLSR (GAUZE/BANDAGES/DRESSINGS) ×1
BNDG CMPR MED 10X6 ELC LF (GAUZE/BANDAGES/DRESSINGS) ×1
BNDG ELASTIC 6INX 5YD STR LF (GAUZE/BANDAGES/DRESSINGS) ×1 IMPLANT
BNDG ELASTIC 6X10 VLCR STRL LF (GAUZE/BANDAGES/DRESSINGS) IMPLANT
BOWL SMART MIX CTS (DISPOSABLE) ×1 IMPLANT
BSPLAT TIB 7 CMNT FX BRNG STRL (Knees) ×1 IMPLANT
CEMENT HV SMART SET (Cement) IMPLANT
COMP FEM CMT ATTUNE RT 6 (Joint) ×1 IMPLANT
COMPONENT FEM CMT ATTUNE RT 6 (Joint) IMPLANT
COVER SURGICAL LIGHT HANDLE (MISCELLANEOUS) ×1 IMPLANT
CUFF TOURN SGL QUICK 34 (TOURNIQUET CUFF) ×1
CUFF TRNQT CYL 34X4.125X (TOURNIQUET CUFF) ×1 IMPLANT
DERMABOND ADVANCED .7 DNX12 (GAUZE/BANDAGES/DRESSINGS) ×1 IMPLANT
DRAPE U-SHAPE 47X51 STRL (DRAPES) ×1 IMPLANT
DRESSING AQUACEL AG SP 3.5X10 (GAUZE/BANDAGES/DRESSINGS) ×1 IMPLANT
DRSG AQUACEL AG ADV 3.5X10 (GAUZE/BANDAGES/DRESSINGS) IMPLANT
DRSG AQUACEL AG SP 3.5X10 (GAUZE/BANDAGES/DRESSINGS) ×1
DURAPREP 26ML APPLICATOR (WOUND CARE) ×2 IMPLANT
ELECT REM PT RETURN 15FT ADLT (MISCELLANEOUS) ×1 IMPLANT
GLOVE BIO SURGEON STRL SZ 6 (GLOVE) ×1 IMPLANT
GLOVE BIOGEL PI IND STRL 6.5 (GLOVE) ×1 IMPLANT
GLOVE BIOGEL PI IND STRL 7.5 (GLOVE) ×1 IMPLANT
GLOVE ORTHO TXT STRL SZ7.5 (GLOVE) ×2 IMPLANT
GOWN STRL REUS W/ TWL LRG LVL3 (GOWN DISPOSABLE) ×2 IMPLANT
GOWN STRL REUS W/TWL LRG LVL3 (GOWN DISPOSABLE) ×2
HANDPIECE INTERPULSE COAX TIP (DISPOSABLE) ×1
HOLDER FOLEY CATH W/STRAP (MISCELLANEOUS) IMPLANT
INSERT MED ATTUNE KNEE 5 7 RT (Insert) IMPLANT
KIT TURNOVER KIT A (KITS) IMPLANT
MANIFOLD NEPTUNE II (INSTRUMENTS) ×1 IMPLANT
NDL SAFETY ECLIP 18X1.5 (MISCELLANEOUS) IMPLANT
NS IRRIG 1000ML POUR BTL (IV SOLUTION) ×1 IMPLANT
PACK TOTAL KNEE CUSTOM (KITS) ×1 IMPLANT
PIN FIX SIGMA LCS THRD HI (PIN) IMPLANT
PROTECTOR NERVE ULNAR (MISCELLANEOUS) ×1 IMPLANT
SET HNDPC FAN SPRY TIP SCT (DISPOSABLE) ×1 IMPLANT
SET PAD KNEE POSITIONER (MISCELLANEOUS) ×1 IMPLANT
SPIKE FLUID TRANSFER (MISCELLANEOUS) ×2 IMPLANT
SUT MNCRL AB 4-0 PS2 18 (SUTURE) ×1 IMPLANT
SUT STRATAFIX PDS+ 0 24IN (SUTURE) ×1 IMPLANT
SUT VIC AB 1 CT1 36 (SUTURE) ×1 IMPLANT
SUT VIC AB 2-0 CT1 27 (SUTURE) ×2
SUT VIC AB 2-0 CT1 TAPERPNT 27 (SUTURE) ×2 IMPLANT
SYR 3ML LL SCALE MARK (SYRINGE) ×1 IMPLANT
TOWEL GREEN STERILE FF (TOWEL DISPOSABLE) ×1 IMPLANT
TRAY FOLEY MTR SLVR 16FR STAT (SET/KITS/TRAYS/PACK) ×1 IMPLANT
TUBE SUCTION HIGH CAP CLEAR NV (SUCTIONS) ×1 IMPLANT
WATER STERILE IRR 1000ML POUR (IV SOLUTION) ×2 IMPLANT
WRAP KNEE MAXI GEL POST OP (GAUZE/BANDAGES/DRESSINGS) ×1 IMPLANT

## 2022-08-09 NOTE — Plan of Care (Signed)
  Problem: Education: Goal: Knowledge of the prescribed therapeutic regimen will improve Outcome: Progressing   Problem: Activity: Goal: Ability to avoid complications of mobility impairment will improve Outcome: Progressing   Problem: Pain Management: Goal: Pain level will decrease with appropriate interventions Outcome: Progressing   Problem: Nutrition: Goal: Adequate nutrition will be maintained Outcome: Progressing   

## 2022-08-09 NOTE — Transfer of Care (Signed)
Immediate Anesthesia Transfer of Care Note  Patient: [name].  Procedure(s) Performed: TOTAL KNEE ARTHROPLASTY (Right: Knee)  Patient Location: PACU  Anesthesia Type:GA combined with regional for post-op pain  Level of Consciousness: awake, alert , oriented, and patient cooperative  Airway & Oxygen Therapy: Patient Spontanous Breathing and Patient connected to face mask oxygen  Post-op Assessment: Report given to RN and Post -op Vital signs reviewed and stable  Post vital signs: Reviewed and stable  Last Vitals:  Vitals Value Taken Time  BP 144/89 08/09/22 1132  Temp    Pulse 65 08/09/22 1133  Resp 13 08/09/22 1133  SpO2 94 % 08/09/22 1133  Vitals shown include unfiled device data.  Last Pain:  Vitals:   08/09/22 0848  TempSrc: Oral  PainSc:       Patients Stated Pain Goal: 4 (08/09/22 0846)  Complications: No notable events documented.

## 2022-08-09 NOTE — Anesthesia Procedure Notes (Signed)
Procedure Name: LMA Insertion Date/Time: 08/09/2022 10:03 AM  Performed by: Elyn Peers, CRNAPre-anesthesia Checklist: Patient identified, Emergency Drugs available, Suction available, Patient being monitored and Timeout performed Patient Re-evaluated:Patient Re-evaluated prior to induction Oxygen Delivery Method: Circle system utilized Preoxygenation: Pre-oxygenation with 100% oxygen Induction Type: IV induction Ventilation: Mask ventilation without difficulty LMA: LMA inserted LMA Size: 5.0 Number of attempts: 1 Placement Confirmation: positive ETCO2 and breath sounds checked- equal and bilateral Tube secured with: Tape Dental Injury: Teeth and Oropharynx as per pre-operative assessment

## 2022-08-09 NOTE — Evaluation (Signed)
Physical Therapy Evaluation Patient Details Name: [name]. MRN: [id number] DOB: 1935/11/22 [name] Date: 08/09/2022  History of Present Illness  Pt s/p R TKR and with hx of L THR and L TKR  Clinical Impression  Pt s/p R TKR and presents with decreased R LE strength/ROM and post op pain limiting functional mobility.  Pt should progress to dc home with family assist and reports he plans for HHPT at [facility].      If plan is discharge home, recommend the following: A little help with walking and/or transfers;A little help with bathing/dressing/bathroom;Assistance with cooking/housework;Help with stairs or ramp for entrance;Assist for transportation   Can travel by private vehicle        Equipment Recommendations None recommended by PT  Recommendations for Other Services       Functional Status Assessment Patient has had a recent decline in their functional status and demonstrates the ability to make significant improvements in function in a reasonable and predictable amount of time.     Precautions / Restrictions Precautions Precautions: Fall Restrictions Weight Bearing Restrictions: No Other Position/Activity Restrictions: WBAT      Mobility  Bed Mobility Overal bed mobility: Needs Assistance Bed Mobility: Supine to Sit     Supine to sit: Min assist     General bed mobility comments: increased time with min assist to manage R LE    Transfers Overall transfer level: Needs assistance Equipment used: Rolling walker (2 wheels) Transfers: Sit to/from Stand Sit to Stand: Mod assist           General transfer comment: cues for LE management and use of UE to self assist    Ambulation/Gait Ambulation/Gait assistance: Min assist Gait Distance (Feet): 16 Feet Assistive device: Rolling walker (2 wheels) Gait Pattern/deviations: Step-to pattern, Decreased step length - right, Decreased step length - left, Shuffle, Trunk flexed Gait velocity: decr      General Gait Details: cues for sequence, posture, position from RW and stide length  Stairs            Wheelchair Mobility     Tilt Bed    Modified Rankin (Stroke Patients Only)       Balance Overall balance assessment: Needs assistance Sitting-balance support: No upper extremity supported, Feet supported Sitting balance-Leahy Scale: Good     Standing balance support: Bilateral upper extremity supported Standing balance-Leahy Scale: Poor                               Pertinent Vitals/Pain Pain Assessment Pain Assessment: 0-10 Pain Score: 4  Pain Location: R knee Pain Descriptors / Indicators: Aching, Sore Pain Intervention(s): Limited activity within patient's tolerance, Monitored during session, Premedicated before session, Ice applied    Home Living Family/patient expects to be discharged to:: Private residence Living Arrangements: Spouse/significant other Available Help at Discharge: Family;Available 24 hours/day Type of Home: Apartment Home Access: Level entry   Entrance Stairs-Number of Steps: 0     Home Equipment: Rolling Walker (2 wheels);Cane - single point;Shower seat;BSC/3in1;Grab bars - tub/shower Additional Comments: Pt lives at [employer] retirement community    Prior Function Prior Level of Function : Independent/Modified Independent;Driving             Mobility Comments: occasional use of cane       Hand Dominance   Dominant Hand: Right    Extremity/Trunk Assessment   Upper Extremity Assessment Upper Extremity Assessment: Overall WFL for  tasks assessed    Lower Extremity Assessment Lower Extremity Assessment: RLE deficits/detail    Cervical / Trunk Assessment Cervical / Trunk Assessment: Normal  Communication   Communication: No difficulties  Cognition Arousal/Alertness: Awake/alert Behavior During Therapy: WFL for tasks assessed/performed Overall Cognitive Status: Within Functional Limits for tasks  assessed                                          General Comments      Exercises Total Joint Exercises Ankle Circles/Pumps: AROM, Both, 15 reps, Supine   Assessment/Plan    PT Assessment Patient needs continued PT services  PT Problem List Decreased strength;Decreased range of motion;Decreased activity tolerance;Decreased balance;Decreased mobility;Decreased knowledge of use of DME;Pain       PT Treatment Interventions DME instruction;Gait training;Stair training;Functional mobility training;Therapeutic activities;Therapeutic exercise;Patient/family education    PT Goals (Current goals can be found in the Care Plan section)  Acute Rehab PT Goals Patient Stated Goal: Regain IND PT Goal Formulation: With patient Time For Goal Achievement: 08/16/22 Potential to Achieve Goals: Good    Frequency 7X/week     Co-evaluation               AM-PAC PT "6 Clicks" Mobility  Outcome Measure Help needed turning from your back to your side while in a flat bed without using bedrails?: A Little Help needed moving from lying on your back to sitting on the side of a flat bed without using bedrails?: A Little Help needed moving to and from a bed to a chair (including a wheelchair)?: A Little Help needed standing up from a chair using your arms (e.g., wheelchair or bedside chair)?: A Lot Help needed to walk in hospital room?: A Little Help needed climbing 3-5 steps with a railing? : A Lot 6 Click Score: 16    End of Session Equipment Utilized During Treatment: Gait belt Activity Tolerance: Patient tolerated treatment well Patient left: in chair;with call bell/phone within reach;with chair alarm set Nurse Communication: Mobility status PT Visit Diagnosis: Difficulty in walking, not elsewhere classified (R26.2)    Time: 2841-3244 PT Time Calculation (min) (ACUTE ONLY): 31 min   Charges:   PT Evaluation $PT Eval Low Complexity: 1 Low PT Treatments $Gait  Training: 8-22 mins PT General Charges $$ ACUTE PT VISIT: 1 Visit         Mauro Kaufmann PT Acute Rehabilitation Services Pager [phone] Office [phone]   Celia Friedland 08/09/2022, 4:04 PM

## 2022-08-09 NOTE — Interval H&P Note (Signed)
History and Physical Interval Note:  08/09/2022 9:08 AM  [name].  has presented today for surgery, with the diagnosis of Right knee osteoarthritis.  The various methods of treatment have been discussed with the patient and family. After consideration of risks, benefits and other options for treatment, the patient has consented to  Procedure(s) with comments: TOTAL KNEE ARTHROPLASTY (Right) - 90 as a surgical intervention.  The patient's history has been reviewed, patient examined, no change in status, stable for surgery.  I have reviewed the patient's chart and labs.  Questions were answered to the patient's satisfaction.     [name]

## 2022-08-09 NOTE — Anesthesia Postprocedure Evaluation (Signed)
Anesthesia Post Note  Patient: [name].  Procedure(s) Performed: TOTAL KNEE ARTHROPLASTY (Right: Knee)     Patient location during evaluation: PACU Anesthesia Type: Regional and General Level of consciousness: awake and alert Pain management: pain level controlled Vital Signs Assessment: post-procedure vital signs reviewed and stable Respiratory status: spontaneous breathing, nonlabored ventilation, respiratory function stable and patient connected to nasal cannula oxygen Cardiovascular status: blood pressure returned to baseline and stable Postop Assessment: no apparent nausea or vomiting Anesthetic complications: no  No notable events documented.  Last Vitals:  Vitals:   08/09/22 1235 08/09/22 1440  BP: 137/70 (!) 148/74  Pulse: 70 68  Resp: 17 16  Temp: 36.8 C 36.7 C  SpO2: 94% 99%    Last Pain:  Vitals:   08/09/22 1440  TempSrc: Oral  PainSc:                  Aline Wesche L Thanh Pomerleau

## 2022-08-09 NOTE — Care Plan (Signed)
Ortho Bundle Case Management Note  Patient Details  Name: [name]. MRN: [id number] Date of Birth: 1935/04/12                  R TKA on 08/09/22.  DCP: Home with wife and son. Lives at [name]. DME: No needs. Has RW and cane.  PT: Barrett Henle   DME Arranged:  N/A DME Agency:       Additional Comments: Please contact me with any questions of if this plan should need to change.    Despina Pole, Case Manager  [facility]  [phone] 08/09/2022, 11:18 AM

## 2022-08-09 NOTE — Discharge Instructions (Signed)

## 2022-08-09 NOTE — Progress Notes (Signed)
ANTICOAGULATION CONSULT NOTE   Pharmacy Consult for warfarin Indication: hx afib, CVA and mitral valve repair  Allergies  Allergen Reactions   Sulfa Antibiotics Other (See Comments)    Granulocytosis   Sulfamethoxazole-Trimethoprim     Other Reaction(s): Unknown   Zestril [Lisinopril] Cough    Patient Measurements: Height: 6' (182.9 cm) Weight: 79.5 kg (175 lb 3.2 oz) IBW/kg (Calculated) : 77.6 Heparin Dosing Weight:   Vital Signs: Temp: 98.3 F (36.8 C) (08/01 1235) Temp Source: Oral (08/01 1235) BP: 137/70 (08/01 1235) Pulse Rate: 70 (08/01 1235)  Labs: Recent Labs    08/09/22 0845  LABPROT 15.5*  INR 1.2    Estimated Creatinine Clearance: 68.9 mL/min (by C-G formula based on SCr of 0.86 mg/dL).   Medications:  - PTA warfarin regimen: 5mg  daily except 7.5 mg on Monday  Per [facility] clinic note on 08/01/22: 08/03/22: Last dose of warfarin.  08/04/22: No warfarin or enoxaparin (Lovenox).  08/05/22: Inject enoxaparin 80mg  in the fatty abdominal tissue at least 2 inches from the belly button twice a day about 12 hours apart, 8am and 8pm rotate sites. No warfarin.  08/06/22: Inject enoxaparin in the fatty tissue every 12 hours, 8am and 8pm. No warfarin.  08/07/22: Inject enoxaparin in the fatty tissue every 12 hours, 8am and 8pm. No warfarin.  08/08/22: Inject enoxaparin in the fatty tissue in the morning at 8 am (No PM dose). No warfarin. 08/09/22: Procedure Day - No enoxaparin - Resume warfarin in the evening or as directed by doctor (take an extra half tablet with usual dose).  08/10/22: Resume enoxaparin inject in the fatty tissue every 12 hours and take warfarin (take an extra half tablet with usual dose then resume normal dose).   08/11/22: Inject enoxaparin in the fatty tissue every 12 hours and take warfarin.  08/12/22: Inject enoxaparin in the fatty tissue every 12 hours and take warfarin. 08/13/22: Inject enoxaparin in the fatty tissue every 12 hours and take warfarin. 08/14/22:  Inject enoxaparin in the fatty tissue every 12 hours and take warfarin. 08/15/22: Inject enoxaparin in the fatty tissue every 12 hours and take warfarin. 08/16/22: Inject enoxaparin in the fatty tissue and warfarin appt to check INR.   Assessment: Patient is an 87 y.o M with hx afib, CVA and mitral valve repair on warfarin PTA who presented to the ED on 08/09/22 for right TKR. Pharmacy has been consulted to resume warfarin post-op.   Today, 08/09/2022: - INR 1.2 - went to OR ~10a this morning    Goal of Therapy:  INR 2-3 Monitor platelets by anticoagulation protocol: Yes   Plan:  - warfarin 7.5 mg PO x1 tonight - daily INR - monitor for s/sx bleeding   Tyeshia Cornforth P 08/09/2022,2:26 PM

## 2022-08-09 NOTE — Anesthesia Procedure Notes (Signed)
Anesthesia Regional Block: Adductor canal block   Pre-Anesthetic Checklist: , timeout performed,  Correct Patient, Correct Site, Correct Laterality,  Correct Procedure, Correct Position, site marked,  Risks and benefits discussed,  Pre-op evaluation,  At surgeon's request and post-op pain management  Laterality: Right  Prep: Maximum Sterile Barrier Precautions used, chloraprep       Needles:  Injection technique: Single-shot  Needle Type: Echogenic Stimulator Needle     Needle Length: 9cm  Needle Gauge: 21     Additional Needles:   Procedures:,,,, ultrasound used (permanent image in chart),,    Narrative:  Start time: 08/09/2022 9:14 AM End time: 08/09/2022 9:16 AM Injection made incrementally with aspirations every 5 mL. Anesthesiologist: Elmer Picker, MD

## 2022-08-09 NOTE — H&P (Signed)
TOTAL KNEE ADMISSION H&P  Patient is being admitted for right total knee arthroplasty.  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 Belva Crome suggestion -- go home with Foley catheter and f/u with him in the office Monday for removal ( IF SPINAL)  - Bridging with Lovenox - Had issues last time with orthostasis - oxycodone, tylenol, robaxin  Subjective:  Chief Complaint:right knee pain.  HPI: Gerald Lou., 87 y.o. male, has a history of pain and functional disability in the right knee due to arthritis and has failed non-surgical conservative treatments for greater than 12 weeks to includeNSAID's and/or analgesics, corticosteriod injections, and activity modification.  Onset of symptoms was gradual, starting 2 years ago with gradually worsening course since that time. The patient noted no past surgery on the right knee(s).  Patient currently rates pain in the right knee(s) at 8 out of 10 with activity. Patient has worsening of pain with activity and weight bearing, pain that interferes with activities of daily living, and pain with passive range of motion.  Patient has evidence of joint space narrowing by imaging studies.  There is no active infection.  Patient Active Problem List   Diagnosis Date Noted   Gait abnormality 06/28/2022   Bilateral foot-drop 06/28/2022   Preoperative cardiovascular examination 05/30/2022   Other secondary pulmonary hypertension (HCC) 03/20/2022   COVID-19 virus infection 11/11/2021   Weakness 11/08/2021   Urinary catheter in place 11/08/2021   Hematuria 09/25/2021   Symptomatic anemia 09/23/2021   HLD  (hyperlipidemia) 09/23/2021   S/P total knee arthroplasty, left 09/19/2021   Abnormal findings on diagnostic imaging of other specified body structures 06/02/2021   Hypoalbuminemia due to protein-calorie malnutrition (HCC) 05/29/2021   Valvular heart disease 05/28/2021   Cerebrovascular accident (CVA) (HCC) 05/21/2021   SIRS (systemic inflammatory response syndrome) (HCC) 05/21/2021   Generalized weakness 05/21/2021   Otitis externa 05/21/2021   CAD (coronary artery disease) 12/19/2020   Malnutrition of moderate degree 08/02/2020   S/P left total hip arthroplasty 08/02/2020   Closed left hip fracture, initial encounter (HCC) 08/01/2020   Chronic congestive heart failure (HCC) 05/26/2020   Elevated PSA 05/26/2020   History of adenomatous polyp of colon 05/26/2020   History of anemia 05/26/2020   Hypercoagulable state (HCC) 05/26/2020   Impairment of balance 05/26/2020   Peripheral neuropathy 05/26/2020   Personal history of other malignant neoplasm of skin 05/26/2020   Prediabetes 05/26/2020   Presence of prosthetic heart valve 05/26/2020   Sleep disorder 05/26/2020   Thrombocytopenia (HCC) 05/26/2020   Vitamin D deficiency 05/26/2020   Acquired dilation of ascending aorta and aortic root (HCC)    Anemia 09/05/2018   Anemia due to chronic blood loss 09/05/2018   Long term current use of anticoagulant 02/10/2018   s/p mitral valve repair 01/30/2018   S/P tricuspid valve repair 01/30/2018   S/P Maze operation for atrial fibrillation 01/30/2018   Tricuspid regurgitation    Mitral valve regurgitation 10/14/2017   Permanent atrial fibrillation (HCC)    Hyperglycemia    History of ischemic right MCA stroke 10/29/2016   Hypokalemia    Left hemiparesis (HCC)    Dysphagia, post-stroke    S/P AVR (aortic valve replacement)  Benign prostatic hyperplasia with lower urinary tract symptoms    Essential hypertension    Gout    History of small bowel obstruction    History of BPH     S/P AVR 08/03/2010   Hypertrophy of prostate with urinary obstruction and other lower urinary tract symptoms (LUTS) 05/21/2001   Past Medical History:  Diagnosis Date   Acquired dilation of ascending aorta and aortic root (HCC)    43mm aortic root and 41mm ascending aorta on echo 03/2020   Arthritis    Bladder stones    Borderline diabetes    BPH (benign prostatic hyperplasia)    CAD (coronary artery disease) 12/19/2020   Non-obstructive coronary artery disease at cath in 2019   Complication of anesthesia    problem with voiding after anesthesia,   Coronary artery disease    cardiologist-  dr Rockey Situ gerhart NP--- per cath 06-02-2010 non-obstructive cad pLAD 30-40%   CVA (cerebral vascular accident) (HCC) 10/24/2016   Diverticulosis of colon    Dysrhythmia    afib   Gout    Heart murmur    History of adenomatous polyp of colon    2002-- tubular adenoma   History of aortic insufficiency    severe -- s/p  AVR 08-03-2010   History of small bowel obstruction    02/ 2007 mechanical sbo s/p  surgical intervention;  partial sbo 09/ 2011 and 03-20-2011 resolved without surgical intervention   History of urinary retention    HTN (hypertension)    Other secondary pulmonary hypertension (HCC) 03/20/2022   TTE 03/19/22: EF 65-70, no RWMA, moderate LVH, normal RVSF, severe pulmonary hypertension (RVSP 60.4), severe BAE, normal structure and function of mitral valve repair (mean gradient 9), mild TR, trivial AI, normal structure and function of AVR (mean 15.4), moderate PI, aortic root and ascending aorta 40 mm, RAP 15   Peripheral neuropathy    Persistent atrial fibrillation (HCC)    Pre-diabetes    S/P aortic valve replacement with prosthetic valve 08/03/2010   tissue valve   S/P Maze operation for atrial fibrillation 01/30/2018   Complete bilateral atrial lesion set using bipolar radiofrequency and cryothermy with clipping of LA appendage   S/P MVR (mitral valve repair) 01/30/2018    Complex valvuloplasty including artificial Gore-tex neochord placement x4 and Carbo medics Annuloflex ring annuloplasty, size 28   S/P patent foramen ovale closure 08/03/2010   at same time AVR   S/P tricuspid valve repair 01/30/2018   Using an MC3 Annuloplasty ring, size 28   Stroke (HCC)    Thrombocytopenia (HCC)    Tricuspid regurgitation     Past Surgical History:  Procedure Laterality Date   BIOPSY  09/07/2018   Procedure: BIOPSY;  Surgeon: Kathi Der, MD;  Location: [facility];  Service: Gastroenterology;;   CARDIAC CATHETERIZATION  06-02-2010  dr Anne Fu   non-obstructive cad- pLAD 30-40%/  normal LVSF/  severe AI   CARDIOVASCULAR STRESS TEST  04/12/2016   Low risk nuclear perfusion study w/ no significant reversible ischemia/  normal LV function and wall motion ,  stress ef 60%/  2mm inferior and lateral scooped ST-segment depression w/ exercise (may be repolarization abnormality), exercise capacity was moderately reduced   CATARACT EXTRACTION W/ INTRAOCULAR LENS  IMPLANT, BILATERAL  02/2010   CLIPPING OF ATRIAL APPENDAGE N/A 01/30/2018   Procedure: CLIPPING OF LEFT ATRIAL APPENDAGE USING ATRICLIP PRO2 ;  Surgeon: Purcell Nails, MD;  Location: [facility] OR;  Service: Open Heart Surgery;  Laterality:  N/A;   COLONOSCOPY WITH PROPOFOL N/A 10/21/2018   Procedure: COLONOSCOPY WITH PROPOFOL;  Surgeon: Kathi Der, MD;  Location: [facility];  Service: Gastroenterology;  Laterality: N/A;   CYSTOSCOPY WITH LITHOLAPAXY N/A 06/05/2016   Procedure: CYSTOSCOPY WITH LITHOLAPAXY and fulgarization of bladder neck;  Surgeon: Bjorn Pippin, MD;  Location: [facility];  Service: Urology;  Laterality: N/A;   ESOPHAGOGASTRODUODENOSCOPY (EGD) WITH PROPOFOL N/A 09/07/2018   Procedure: ESOPHAGOGASTRODUODENOSCOPY (EGD) WITH PROPOFOL;  Surgeon: Kathi Der, MD;  Location: [facility];  Service: Gastroenterology;  Laterality: N/A;   EXPLORATORY LAPARTOMY /  CHOLECYSTECTOMY   02/28/2005   for Small  bowel obstruction (mechnical)   IR ANGIO EXTRACRAN SEL COM CAROTID INNOMINATE UNI L MOD SED  10/24/2016   IR ANGIO VERTEBRAL SEL SUBCLAVIAN INNOMINATE BILAT MOD SED  10/24/2016   IR PERCUTANEOUS ART THROMBECTOMY/INFUSION INTRACRANIAL INC DIAG ANGIO  10/24/2016   IR RADIOLOGIST EVAL & MGMT  12/05/2016   LEFT KNEE ARTHROSCOPY  2006   MAZE N/A 01/30/2018   Procedure: MAZE;  Surgeon: Purcell Nails, MD;  Location: [facility] OR;  Service: Open Heart Surgery;  Laterality: N/A;   MITRAL VALVE REPAIR N/A 01/30/2018   Procedure: MITRAL VALVE REPAIR (MVR) USING CARBOMEDICS ANNULOFLEX SIZE 28;  Surgeon: Purcell Nails, MD;  Location: [facility] OR;  Service: Open Heart Surgery;  Laterality: N/A;   POLYPECTOMY  10/21/2018   Procedure: POLYPECTOMY;  Surgeon: Kathi Der, MD;  Location: [facility];  Service: Gastroenterology;;   RADIOLOGY WITH ANESTHESIA N/A 10/24/2016   Procedure: RADIOLOGY WITH ANESTHESIA;  Surgeon: Julieanne Cotton, MD;  Location: [place] OR;  Service: Radiology;  Laterality: N/A;   RIGHT FOOT SURGERY     RIGHT MINIATURE ANTERIOR THORACOTOMY FOR AORTIC VALVE REPLACEMENT AND CLOSURE PATENT FORAMEN OVALE  08-03-2010  DR Neldon Labella Magna-ease pericardial tissue valve (25mm)   RIGHT/LEFT HEART CATH AND CORONARY ANGIOGRAPHY N/A 10/14/2017   Procedure: RIGHT/LEFT HEART CATH AND CORONARY ANGIOGRAPHY;  Surgeon: Tonny Bollman, MD;  Location: [facility] INVASIVE CV LAB;  Service: Cardiovascular;  Laterality: N/A;   TEE WITHOUT CARDIOVERSION N/A 10/14/2017   Procedure: TRANSESOPHAGEAL ECHOCARDIOGRAM (TEE);  Surgeon: Wendall Stade, MD;  Location: [facility] ENDOSCOPY;  Service: Cardiovascular;  Laterality: N/A;   TOTAL HIP ARTHROPLASTY Left 08/02/2020   Procedure: TOTAL HIP ARTHROPLASTY ANTERIOR APPROACH;  Surgeon: Durene Romans, MD;  Location: [facility];  Service: Orthopedics;  Laterality: Left;   TOTAL KNEE ARTHROPLASTY Left 09/19/2021   Procedure: TOTAL KNEE ARTHROPLASTY;  Surgeon: Durene Romans, MD;  Location: [facility];  Service: Orthopedics;  Laterality: Left;   TRANSTHORACIC ECHOCARDIOGRAM  05/30/2016  dr skains   moderate  LVH ef 60-65%/  bioprothesis aortic valve present ,normal grandient and no AI /  mild MV calcification , moderate MR /  mild PR/ moderate TR/  PASP 78mmHg/ (RA denisty was identified 04-27-2016 echo) and is seen again today, this is likely a promient eustacian ridge, atrium is normal size   TRICUSPID VALVE REPLACEMENT N/A 01/30/2018   Procedure: TRICUSPID VALVE REPAIR USING MC3 SIZE 28;  Surgeon: Purcell Nails, MD;  Location: [facility] OR;  Service: Open Heart Surgery;  Laterality: N/A;    No current facility-administered medications for this encounter.   Current Outpatient Medications  Medication Sig Dispense Refill Last Dose   allopurinol (ZYLOPRIM) 300 MG tablet Take 150 mg by mouth every morning.      amLODipine (NORVASC) 10 MG tablet TAKE 1 TABLET BY MOUTH DAILY 100 tablet 3    atenolol (TENORMIN)  25 MG tablet Take 0.5 tablets (12.5 mg total) by mouth daily. 30 tablet 4    cholecalciferol (VITAMIN D3) 25 MCG (1000 UNIT) tablet Take 1,000 Units by mouth daily.      Cinnamon 500 MG capsule Take 500 mg by mouth every morning.      ferrous sulfate 325 (65 FE) MG EC tablet Take 325 mg by mouth 2 (two) times daily.      finasteride (PROSCAR) 5 MG tablet Take 1 tablet (5 mg total) by mouth daily. 30 tablet 0    Multiple Vitamins-Minerals (CENTRUM SILVER ADULT 50+) TABS Take 1 tablet by mouth daily.      rosuvastatin (CRESTOR) 10 MG tablet TAKE 1 TABLET BY MOUTH DAILY 100 tablet 3    silodosin (RAPAFLO) 8 MG CAPS capsule Take 8 mg by mouth daily.      traZODone (DESYREL) 50 MG tablet Take 50 mg by mouth at bedtime as needed for sleep.      warfarin (COUMADIN) 5 MG tablet Take 1 tablet (5 mg total) by mouth daily at 4 PM. Until seen by warfarin clinic. (Patient taking differently: Take 5-7.5 mg by mouth See admin instructions. Take 7.5 mg by mouth on Monday and take  5 mg Tuesday-Sunday.) 135 tablet 3    enoxaparin (LOVENOX) 80 MG/0.8ML injection Inject 0.8 mLs (80 mg total) into the skin every 12 (twelve) hours. 16 mL 1    gabapentin (NEURONTIN) 300 MG capsule TAKE 1 CAPSULE BY MOUTH TWICE  DAILY (Patient not taking: Reported on 07/23/2022) 120 capsule 1 Not Taking   Allergies  Allergen Reactions   Sulfa Antibiotics Other (See Comments)    Granulocytosis   Sulfamethoxazole-Trimethoprim     Other Reaction(s): Unknown   Zestril [Lisinopril] Cough    Social History   Tobacco Use   Smoking status: Never   Smokeless tobacco: Never  Substance Use Topics   Alcohol use: Yes    Comment: ONE OR TWO PER MONTH    Family History  Problem Relation Age of Onset   Heart disease Mother    Brain cancer Father      Review of Systems  Constitutional:  Negative for chills and fever.  Respiratory:  Negative for cough and shortness of breath.   Cardiovascular:  Negative for chest pain.  Gastrointestinal:  Negative for nausea and vomiting.  Musculoskeletal:  Positive for arthralgias.     Objective:  Physical Exam Well nourished and well developed. General: Alert and oriented x3, cooperative and pleasant, no acute distress. Head: normocephalic, atraumatic, neck supple. Eyes: EOMI.  Musculoskeletal: Right knee exam: No palpable effusion, warmth erythema Slight flexion contracture associated with flexion over 110 degrees with tightness Tenderness over the medial and anterior aspect knee No lower extremity edema, erythema or calf tenderness  Calves soft and nontender. Motor function intact in LE. Strength 5/5 LE bilaterally. Neuro: Distal pulses 2+. Sensation to light touch intact in LE.  Vital signs in last 24 hours:    Labs:   Estimated body mass index is 23.76 kg/m as calculated from the following:   Height as of 07/24/22: 6' (1.829 m).   Weight as of 07/24/22: 79.5 kg.   Imaging Review Plain radiographs demonstrate severe degenerative  joint disease of the right knee(s). The overall alignment isneutral. The bone quality appears to be adequate for age and reported activity level.      Assessment/Plan:  End stage arthritis, right knee   The patient history, physical examination, clinical judgment of the provider and  imaging studies are consistent with end stage degenerative joint disease of the right knee(s) and total knee arthroplasty is deemed medically necessary. The treatment options including medical management, injection therapy arthroscopy and arthroplasty were discussed at length. The risks and benefits of total knee arthroplasty were presented and reviewed. The risks due to aseptic loosening, infection, stiffness, patella tracking problems, thromboembolic complications and other imponderables were discussed. The patient acknowledged the explanation, agreed to proceed with the plan and consent was signed. Patient is being admitted for inpatient treatment for surgery, pain control, PT, OT, prophylactic antibiotics, VTE prophylaxis, progressive ambulation and ADL's and discharge planning. The patient is planning to be discharged  home.     Patient's anticipated LOS is less than 2 midnights, meeting these requirements: - Younger than 104 - Lives within 1 hour of care - Has a competent adult at home to recover with post-op recover - NO history of  - Chronic pain requiring opiods  - Diabetes  - Coronary Artery Disease  - Heart failure  - Heart attack  - Stroke  - DVT/VTE  - Cardiac arrhythmia  - Respiratory Failure/COPD  - Renal failure  - Anemia  - Advanced Liver disease  Rosalene Billings, PA-C Orthopedic Surgery [facility] [place] [phone]

## 2022-08-09 NOTE — Progress Notes (Signed)
CARDIO ANTICOAG RECS:   08/03/22: Last dose of warfarin.   08/04/22: No warfarin or enoxaparin (Lovenox).   08/05/22: Inject enoxaparin 80mg  in the fatty abdominal tissue at least 2 inches from the belly button twice a day about 12 hours apart, 8am and 8pm rotate sites. No warfarin.   08/06/22: Inject enoxaparin in the fatty tissue every 12 hours, 8am and 8pm. No warfarin.   08/07/22: Inject enoxaparin in the fatty tissue every 12 hours, 8am and 8pm. No warfarin.   08/08/22: Inject enoxaparin in the fatty tissue in the morning at 8 am (No PM dose). No warfarin.   08/09/22: Procedure Day - No enoxaparin - Resume warfarin in the evening or as directed by doctor (take an extra half tablet with usual dose).   08/10/22: Resume enoxaparin inject in the fatty tissue every 12 hours and take warfarin (take an extra half tablet with usual dose then resume normal dose).   08/11/22: Inject enoxaparin in the fatty tissue every 12 hours and take warfarin.   08/12/22: Inject enoxaparin in the fatty tissue every 12 hours and take warfarin.   08/13/22: Inject enoxaparin in the fatty tissue every 12 hours and take warfarin.   08/14/22: Inject enoxaparin in the fatty tissue every 12 hours and take warfarin.   08/15/22: Inject enoxaparin in the fatty tissue every 12 hours and take warfarin.   08/16/22: Inject enoxaparin in the fatty tissue and warfarin appt to check INR.

## 2022-08-09 NOTE — Op Note (Signed)
NAME:  [name].                      MEDICAL RECORD NO.:  [id number]                             FACILITY:  [facility]      PHYSICIAN:  Madlyn Frankel. Charlann Boxer, M.D.  DATE OF BIRTH:  [birth year]      DATE OF PROCEDURE:  08/09/2022                                     OPERATIVE REPORT         PREOPERATIVE DIAGNOSIS:  Right knee osteoarthritis.      POSTOPERATIVE DIAGNOSIS:  Right knee osteoarthritis.      FINDINGS:  The patient was noted to have complete loss of cartilage and   bone-on-bone arthritis with associated osteophytes in the medial and patellofemoral compartments of   the knee with a significant synovitis and associated effusion.  The patient had failed months of conservative treatment including medications, injection therapy, activity modification.     PROCEDURE:  Right total knee replacement.      COMPONENTS USED:  DePuy Attune FB CR MS knee   system, a size 6 femur, 6 tibia, size 7 mm CR MS AOX insert, and 38 anatomic patellar   button.      SURGEON:  Madlyn Frankel. Charlann Boxer, M.D.      ASSISTANT:  Rosalene Billings, PA-C.      ANESTHESIA:  General and Regional.      SPECIMENS:  None.      COMPLICATION:  None.      DRAINS:  None.  EBL: <200 cc      TOURNIQUET TIME:  27 min at 225 mmHg     The patient was stable to the recovery room.      INDICATION FOR PROCEDURE:  [name]. is a 87 y.o. male patient of   mine.  The patient had been seen, evaluated, and treated for months conservatively in the   office with medication, activity modification, and injections.  The patient had   radiographic changes of bone-on-bone arthritis with endplate sclerosis and osteophytes noted.  Based on the radiographic changes and failed conservative measures, the patient   decided to proceed with definitive treatment, total knee replacement.  Risks of infection, DVT, component failure, need for revision surgery, neurovascular injury were reviewed in the office setting.  The postop  course was reviewed stressing the efforts to maximize post-operative satisfaction and function.  Consent was obtained for benefit of pain   relief.      PROCEDURE IN DETAIL:  The patient was brought to the operative theater.   Once adequate anesthesia, preoperative antibiotics, 2 gm of Ancef,1 gm of Tranexamic Acid, and 10 mg of Decadron administered, the patient was positioned supine with a right thigh tourniquet placed.  The  right lower extremity was prepped and draped in sterile fashion.  A time-   out was performed identifying the patient, planned procedure, and the appropriate extremity.      The right lower extremity was placed in the [facility] leg holder.  The leg was   exsanguinated, tourniquet elevated to 225 mmHg.  A midline incision was   made followed by median parapatellar arthrotomy.  Following initial   exposure,  attention was first directed to the patella.  Precut   measurement was noted to be 25 mm.  I resected down to 14 mm and used a   38 anatomic patellar button to restore patellar height as well as cover the cut surface.      The lug holes were drilled and a metal shim was placed to protect the   patella from retractors and saw blade during the procedure.      At this point, attention was now directed to the femur.  The femoral   canal was opened with a drill, irrigated to try to prevent fat emboli.  An   intramedullary rod was passed at 5 degrees valgus, 9 mm of bone was   resected off the distal femur.  Following this resection, the tibia was   subluxated anteriorly.  Using the extramedullary guide, 2 mm of bone was resected off   the proximal medial tibia.  We confirmed the gap would be   stable medially and laterally with a size 5 spacer block as well as confirmed that the tibial cut was perpendicular in the coronal plane, checking with an alignment rod.      Once this was done, I sized the femur to be a size 6 in the anterior-   posterior dimension, chose a standard  component based on medial and   lateral dimension.  The size 6 rotation block was then pinned in   position anterior referenced using the C-clamp to set rotation.  The   anterior, posterior, and  chamfer cuts were made without difficulty nor   notching making certain that I was along the anterior cortex to help   with flexion gap stability.      The final box cut was made off the lateral aspect of distal femur.      At this point, the tibia was sized to be a size 6.  The size 6 tray was   then pinned in position through the medial third of the tubercle,   drilled, and keel punched.  Trial reduction was now carried with a 6 femur,  6 tibia, a size 7 mm CR MS insert, and the 38 anatomic patella botton.  The knee was brought to full extension with good flexion stability with the patella   tracking through the trochlea without application of pressure.  Given   all these findings the trial components removed.  Final components were   opened and cement was mixed.  The knee was irrigated with normal saline solution and pulse lavage.  The synovial lining was   then injected with 30 cc of 0.25% Marcaine with epinephrine, 1 cc of Toradol and 30 cc of NS for a total of 61 cc.     Final implants were then cemented onto cleaned and dried cut surfaces of bone with the knee brought to extension with a size 7 mm CR MS trial insert.      Once the cement had fully cured, excess cement was removed   throughout the knee.  I confirmed that I was satisfied with the range of   motion and stability, and the final size 7 mm CR MS AOX insert was chosen.  It was   placed into the knee.      The tourniquet had been let down at 27 minutes.  No significant   hemostasis was required.  The extensor mechanism was then reapproximated using #1 Vicryl and #1 Stratafix sutures with the knee  in flexion.  The   remaining wound was closed with 2-0 Vicryl and running 4-0 Monocryl.   The knee was cleaned, dried, dressed  sterilely using Dermabond and   Aquacel dressing.  The patient was then   brought to recovery room in stable condition, tolerating the procedure   well.   Please note that Physician Assistant, Rosalene Billings, PA-C was present for the entirety of the case, and was utilized for pre-operative positioning, peri-operative retractor management, general facilitation of the procedure and for primary wound closure at the end of the case.              Madlyn Frankel Charlann Boxer, M.D.    08/09/2022 9:20 AM

## 2022-08-09 NOTE — Progress Notes (Signed)
Per pt "right arm bleeding from cat scratch".Area cleanded and Sacral dsg and tegaderm applied

## 2022-08-10 DIAGNOSIS — I509 Heart failure, unspecified: Secondary | ICD-10-CM | POA: Diagnosis not present

## 2022-08-10 DIAGNOSIS — Z79899 Other long term (current) drug therapy: Secondary | ICD-10-CM | POA: Diagnosis not present

## 2022-08-10 DIAGNOSIS — M1711 Unilateral primary osteoarthritis, right knee: Secondary | ICD-10-CM | POA: Diagnosis not present

## 2022-08-10 DIAGNOSIS — I2510 Atherosclerotic heart disease of native coronary artery without angina pectoris: Secondary | ICD-10-CM | POA: Diagnosis not present

## 2022-08-10 DIAGNOSIS — Z96652 Presence of left artificial knee joint: Secondary | ICD-10-CM | POA: Diagnosis not present

## 2022-08-10 DIAGNOSIS — Z7901 Long term (current) use of anticoagulants: Secondary | ICD-10-CM | POA: Diagnosis not present

## 2022-08-10 DIAGNOSIS — Z8616 Personal history of COVID-19: Secondary | ICD-10-CM | POA: Diagnosis not present

## 2022-08-10 DIAGNOSIS — Z85828 Personal history of other malignant neoplasm of skin: Secondary | ICD-10-CM | POA: Diagnosis not present

## 2022-08-10 DIAGNOSIS — Z8673 Personal history of transient ischemic attack (TIA), and cerebral infarction without residual deficits: Secondary | ICD-10-CM | POA: Diagnosis not present

## 2022-08-10 DIAGNOSIS — I110 Hypertensive heart disease with heart failure: Secondary | ICD-10-CM | POA: Diagnosis not present

## 2022-08-10 DIAGNOSIS — Z96642 Presence of left artificial hip joint: Secondary | ICD-10-CM | POA: Diagnosis not present

## 2022-08-10 DIAGNOSIS — I4821 Permanent atrial fibrillation: Secondary | ICD-10-CM | POA: Diagnosis not present

## 2022-08-10 MED ORDER — METHOCARBAMOL 500 MG PO TABS: 500.0000 mg | ORAL_TABLET | Freq: Four times a day (QID) | ORAL | 1 refills | Status: DC | PRN

## 2022-08-10 MED ORDER — SENNA 8.6 MG PO TABS: 2.0000 | ORAL_TABLET | Freq: Every day | ORAL | 0 refills | Status: AC

## 2022-08-10 MED ORDER — CEFADROXIL 500 MG PO CAPS: 500.0000 mg | ORAL_CAPSULE | Freq: Two times a day (BID) | ORAL | 0 refills | Status: AC

## 2022-08-10 MED ORDER — WARFARIN SODIUM 5 MG PO TABS: 7.5000 mg | ORAL_TABLET | Freq: Once | ORAL | Status: DC

## 2022-08-10 MED ORDER — OXYCODONE HCL 5 MG PO TABS: 2.5000 mg | ORAL_TABLET | ORAL | 0 refills | Status: DC | PRN

## 2022-08-10 MED ORDER — POLYETHYLENE GLYCOL 3350 17 G PO PACK: 17.0000 g | PACK | Freq: Two times a day (BID) | ORAL | 0 refills | Status: DC

## 2022-08-10 NOTE — TOC Transition Note (Signed)
Transition of Care [facility]) - CM/SW Discharge Note   Patient Details  Name: [name]. MRN: [id number] Date of Birth: Jun 21, 1935  [facility] of [facility] CM/SW Contact:  Amada Jupiter, LCSW Phone Number: 08/10/2022, 1:29 PM   Clinical Narrative:     Met with pt and son today to review dc needs.  Both confirm plan for pt to return to [place] apt at [facility] where he lives with his spouse.  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  Pt agreeable with [facility] to provide the in home PT services - orders faxed.  Pt has all needed DME in the home.  No further TOC needs.  Final next level of care: Home w Home Health Services Barriers to Discharge: No Barriers Identified   Patient Goals and CMS Choice      Discharge Placement                         Discharge Plan and Services Additional resources added to the After Visit Summary for                  DME Arranged: N/A DME Agency: NA       HH Arranged: PT HH Agency: Other - See comment Transport planner) Date HH Agency Contacted: 08/10/22      Social Determinants of Health (SDOH) Interventions SDOH Screenings   Food Insecurity: No Food Insecurity (08/09/2022)  Housing: Low Risk  (08/09/2022)  Transportation Needs: No Transportation Needs (08/09/2022)  Utilities: Not At Risk (08/09/2022)  Alcohol Screen: Low Risk  (06/02/2021)  Depression (PHQ2-9): Low Risk  (06/16/2021)  Financial Resource Strain: Low Risk  (06/16/2021)  Social Connections: Unknown (06/02/2021)  Stress: No Stress Concern Present (06/16/2021)  Tobacco Use: Low Risk  (08/09/2022)     Readmission Risk Interventions    11/10/2021    3:54 PM  Readmission Risk Prevention Plan  Transportation Screening Complete  PCP or Specialist Appt within 3-5 Days Complete  HRI or Home Care Consult Complete  Social  Work Consult for Recovery Care Planning/Counseling Complete  Palliative Care Screening Not Applicable  Medication Review Oceanographer) Complete

## 2022-08-10 NOTE — Progress Notes (Signed)
Notified A. Donovan PA-C of pt urine retention. Went in to check on [name] and he had voided 780 cc clear straw urine. Will follow his continued urine output.

## 2022-08-10 NOTE — Progress Notes (Signed)
Subjective: 1 Day Post-Op Procedure(s) (LRB): TOTAL KNEE ARTHROPLASTY (Right) Patient reports pain as mild.   Patient seen in rounds by Dr. Charlann Boxer. Patient is well, and has had no acute complaints or problems. No acute events overnight. He was fortunately able to void overnight! Patient ambulated a few feet with PT.  We will start therapy today.   Objective: Vital signs in last 24 hours: Temp:  [97.7 F (36.5 C)-98.3 F (36.8 C)] 98 F (36.7 C) (08/02 0530) Pulse Rate:  [63-86] 86 (08/02 0530) Resp:  [12-19] 17 (08/02 0530) BP: (125-158)/(70-89) 155/77 (08/02 0530) SpO2:  [94 %-99 %] 98 % (08/02 0530) Weight:  [79.5 kg] 79.5 kg (08/01 0846)  Intake/Output from previous day:  Intake/Output Summary (Last 24 hours) at 08/10/2022 0747 Last data filed at 08/10/2022 1610 Gross per 24 hour  Intake 2195.61 ml  Output 2050 ml  Net 145.61 ml     Intake/Output this shift: Total I/O In: -  Out: 400 [Urine:400]  Labs: Recent Labs    08/10/22 0319  HGB 9.0*   Recent Labs    08/10/22 0319  WBC 12.2*  RBC 2.86*  HCT 27.6*  PLT 123*   Recent Labs    08/10/22 0319  NA 135  K 3.5  CL 105  CO2 20*  BUN 15  CREATININE 0.82  GLUCOSE 212*  CALCIUM 8.2*   Recent Labs    08/09/22 0845 08/10/22 0319  INR 1.2 1.3*    Exam: General - Patient is Alert and Oriented Extremity - Neurologically intact Sensation intact distally Intact pulses distally Dorsiflexion/Plantar flexion intact Dressing - dressing C/D/I Motor Function - intact, moving foot and toes well on exam.   Past Medical History:  Diagnosis Date   Acquired dilation of ascending aorta and aortic root (HCC)    43mm aortic root and 41mm ascending aorta on echo 03/2020   Arthritis    Bladder stones    Borderline diabetes    BPH (benign prostatic hyperplasia)    CAD (coronary artery disease) 12/19/2020   Non-obstructive coronary artery disease at cath in 2019   Complication of anesthesia    problem with  voiding after anesthesia,   Coronary artery disease    cardiologist-  dr Anne Fu  Lawson Fiscal gerhart NP--- per cath 06-02-2010 non-obstructive cad pLAD 30-40%   CVA (cerebral vascular accident) (HCC) 10/24/2016   Diverticulosis of colon    Dysrhythmia    afib   Gout    Heart murmur    History of adenomatous polyp of colon    2002-- tubular adenoma   History of aortic insufficiency    severe -- s/p  AVR 08-03-2010   History of small bowel obstruction    02/ 2007 mechanical sbo s/p  surgical intervention;  partial sbo 09/ 2011 and 03-20-2011 resolved without surgical intervention   History of urinary retention    HTN (hypertension)    Other secondary pulmonary hypertension (HCC) 03/20/2022   TTE 03/19/22: EF 65-70, no RWMA, moderate LVH, normal RVSF, severe pulmonary hypertension (RVSP 60.4), severe BAE, normal structure and function of mitral valve repair (mean gradient 9), mild TR, trivial AI, normal structure and function of AVR (mean 15.4), moderate PI, aortic root and ascending aorta 40 mm, RAP 15   Peripheral neuropathy    Persistent atrial fibrillation (HCC)    Pre-diabetes    S/P aortic valve replacement with prosthetic valve 08/03/2010   tissue valve   S/P Maze operation for atrial fibrillation 01/30/2018   Complete  bilateral atrial lesion set using bipolar radiofrequency and cryothermy with clipping of LA appendage   S/P MVR (mitral valve repair) 01/30/2018   Complex valvuloplasty including artificial Gore-tex neochord placement x4 and Carbo medics Annuloflex ring annuloplasty, size 28   S/P patent foramen ovale closure 08/03/2010   at same time AVR   S/P tricuspid valve repair 01/30/2018   Using an MC3 Annuloplasty ring, size 28   Stroke (HCC)    Thrombocytopenia (HCC)    Tricuspid regurgitation     Assessment/Plan: 1 Day Post-Op Procedure(s) (LRB): TOTAL KNEE ARTHROPLASTY (Right) Principal Problem:   S/P total knee arthroplasty, right  Estimated body mass index is 23.76  kg/m as calculated from the following:   Height as of this encounter: 6' (1.829 m).   Weight as of this encounter: 79.5 kg. Advance diet Up with therapy D/C IV fluids   Patient's anticipated LOS is less than 2 midnights, meeting these requirements: - Younger than 16 - Lives within 1 hour of care - Has a competent adult at home to recover with post-op recover - NO history of  - Chronic pain requiring opiods  - Diabetes  - Coronary Artery Disease  - Heart failure  - Heart attack  - Stroke  - DVT/VTE  - Cardiac arrhythmia  - Respiratory Failure/COPD  - Renal failure  - Anemia  - Advanced Liver disease     DVT Prophylaxis - Coumadin & lovenox per protocol Weight bearing as tolerated.  Hgb stable at 9 this AM.  He was able to void overnight, so we fortunately avoided foley catheter for now.  Plan is to go Home after hospital stay. Plan for discharge today following 1-2 sessions of PT as long as they are meeting their goals. Patient is scheduled for OPPT. Follow up in the office in 2 weeks.   Rosalene Billings, PA-C Orthopedic Surgery [phone] 08/10/2022, 7:47 AM

## 2022-08-10 NOTE — Progress Notes (Signed)
Physical Therapy Treatment Patient Details Name: [name]. MRN: [id number] DOB: [birth year] [name] Date: 08/10/2022   History of Present Illness Pt s/p R TKR and with hx of L THR and L TKR    PT Comments  Pt continues motivated and progressing well with mobility.  Pt up to ambulate in halls with noted further improvement in stability.  Written HEP reviewed.  Pt eager for dc home this date.    If plan is discharge home, recommend the following: A little help with walking and/or transfers;A little help with bathing/dressing/bathroom;Assistance with cooking/housework;Help with stairs or ramp for entrance;Assist for transportation   Can travel by private vehicle        Equipment Recommendations  None recommended by PT    Recommendations for Other Services       Precautions / Restrictions Precautions Precautions: Fall Restrictions Weight Bearing Restrictions: No Other Position/Activity Restrictions: WBAT     Mobility  Bed Mobility Overal bed mobility: Needs Assistance Bed Mobility: Supine to Sit     Supine to sit: Supervision     General bed mobility comments: Pt up in chair and requests back to same    Transfers Overall transfer level: Needs assistance Equipment used: Rolling walker (2 wheels) Transfers: Sit to/from Stand Sit to Stand: Supervision           General transfer comment: cues for LE management and use of UE to self assist    Ambulation/Gait Ambulation/Gait assistance: Min guard, Supervision Gait Distance (Feet): 180 Feet Assistive device: Rolling walker (2 wheels) Gait Pattern/deviations: Decreased step length - right, Decreased step length - left, Shuffle, Trunk flexed, Step-to pattern, Step-through pattern Gait velocity: decr     General Gait Details: min cues for sequence, posture, position from RW and stride length   Stairs             Wheelchair Mobility     Tilt Bed    Modified Rankin (Stroke Patients Only)        Balance Overall balance assessment: Needs assistance Sitting-balance support: No upper extremity supported, Feet supported Sitting balance-Leahy Scale: Good     Standing balance support: No upper extremity supported Standing balance-Leahy Scale: Fair                              Cognition Arousal/Alertness: Awake/alert Behavior During Therapy: WFL for tasks assessed/performed Overall Cognitive Status: Within Functional Limits for tasks assessed                                          Exercises Total Joint Exercises Ankle Circles/Pumps: AROM, Both, 15 reps, Supine Quad Sets: AROM, Both, 10 reps, Supine Heel Slides: AAROM, Right, 15 reps, Supine Straight Leg Raises: AAROM, AROM, Right, 15 reps, Supine    General Comments        Pertinent Vitals/Pain Pain Assessment Pain Assessment: 0-10 Pain Score: 4  Pain Location: R knee Pain Descriptors / Indicators: Aching, Sore Pain Intervention(s): Limited activity within patient's tolerance, Monitored during session, Premedicated before session, Ice applied    Home Living                          Prior Function            PT Goals (current goals can now be found in  the care plan section) Acute Rehab PT Goals Patient Stated Goal: Regain IND PT Goal Formulation: With patient Time For Goal Achievement: 08/16/22 Potential to Achieve Goals: Good Progress towards PT goals: Progressing toward goals    Frequency    7X/week      PT Plan Current plan remains appropriate    Co-evaluation              AM-PAC PT "6 Clicks" Mobility   Outcome Measure  Help needed turning from your back to your side while in a flat bed without using bedrails?: A Little Help needed moving from lying on your back to sitting on the side of a flat bed without using bedrails?: A Little Help needed moving to and from a bed to a chair (including a wheelchair)?: A Little Help needed standing up  from a chair using your arms (e.g., wheelchair or bedside chair)?: A Little Help needed to walk in hospital room?: A Little Help needed climbing 3-5 steps with a railing? : A Little 6 Click Score: 18    End of Session Equipment Utilized During Treatment: Gait belt Activity Tolerance: Patient tolerated treatment well Patient left: in chair;with call bell/phone within reach;with chair alarm set Nurse Communication: Mobility status PT Visit Diagnosis: Difficulty in walking, not elsewhere classified (R26.2)     Time: 0960-4540 PT Time Calculation (min) (ACUTE ONLY): 15 min  Charges:    $Gait Training: 8-22 mins $Therapeutic Exercise: 8-22 mins PT General Charges $$ ACUTE PT VISIT: 1 Visit                     Mauro Kaufmann PT Acute Rehabilitation Services Pager [phone] Office [phone]    , 08/10/2022, 12:36 PM

## 2022-08-10 NOTE — Progress Notes (Signed)
Physical Therapy Treatment Patient Details Name: [name]. MRN: [id number] DOB: June 17, 1935 [name] Date: 08/10/2022   History of Present Illness Pt s/p R TKR and with hx of L THR and L TKR    PT Comments  Pt very motivated and with noted improvement in ambulatory stability and activity tolerance.  Pt up to ambulate in halls and performed therex program with assist.  Pt requests to attempt ambulation one more time before dc.    If plan is discharge home, recommend the following: A little help with walking and/or transfers;A little help with bathing/dressing/bathroom;Assistance with cooking/housework;Help with stairs or ramp for entrance;Assist for transportation   Can travel by private vehicle        Equipment Recommendations  None recommended by PT    Recommendations for Other Services       Precautions / Restrictions Precautions Precautions: Fall Restrictions Weight Bearing Restrictions: No Other Position/Activity Restrictions: WBAT     Mobility  Bed Mobility Overal bed mobility: Needs Assistance Bed Mobility: Supine to Sit     Supine to sit: Supervision     General bed mobility comments: Increased time but no physical assist    Transfers Overall transfer level: Needs assistance Equipment used: Rolling walker (2 wheels) Transfers: Sit to/from Stand Sit to Stand: Min guard           General transfer comment: cues for LE management and use of UE to self assist    Ambulation/Gait Ambulation/Gait assistance: Min assist, Min guard Gait Distance (Feet): 180 Feet Assistive device: Rolling walker (2 wheels) Gait Pattern/deviations: Step-to pattern, Decreased step length - right, Decreased step length - left, Shuffle, Trunk flexed Gait velocity: decr     General Gait Details: cues for sequence, posture, position from RW and stide length   Stairs             Wheelchair Mobility     Tilt Bed    Modified Rankin (Stroke Patients Only)        Balance Overall balance assessment: Needs assistance Sitting-balance support: No upper extremity supported, Feet supported Sitting balance-Leahy Scale: Good     Standing balance support: Single extremity supported Standing balance-Leahy Scale: Poor                              Cognition Arousal/Alertness: Awake/alert Behavior During Therapy: WFL for tasks assessed/performed Overall Cognitive Status: Within Functional Limits for tasks assessed                                          Exercises Total Joint Exercises Ankle Circles/Pumps: AROM, Both, 15 reps, Supine Quad Sets: AROM, Both, 10 reps, Supine Heel Slides: AAROM, Right, 15 reps, Supine Straight Leg Raises: AAROM, AROM, Right, 15 reps, Supine    General Comments        Pertinent Vitals/Pain Pain Assessment Pain Assessment: 0-10 Pain Score: 4  Pain Location: R knee Pain Descriptors / Indicators: Aching, Sore Pain Intervention(s): Limited activity within patient's tolerance, Monitored during session, Premedicated before session, Ice applied    Home Living                          Prior Function            PT Goals (current goals can now be found in the  care plan section) Acute Rehab PT Goals Patient Stated Goal: Regain IND PT Goal Formulation: With patient Time For Goal Achievement: 08/16/22 Potential to Achieve Goals: Good Progress towards PT goals: Progressing toward goals    Frequency    7X/week      PT Plan Current plan remains appropriate    Co-evaluation              AM-PAC PT "6 Clicks" Mobility   Outcome Measure  Help needed turning from your back to your side while in a flat bed without using bedrails?: A Little Help needed moving from lying on your back to sitting on the side of a flat bed without using bedrails?: A Little Help needed moving to and from a bed to a chair (including a wheelchair)?: A Little Help needed standing up  from a chair using your arms (e.g., wheelchair or bedside chair)?: A Little Help needed to walk in hospital room?: A Little Help needed climbing 3-5 steps with a railing? : A Lot 6 Click Score: 17    End of Session Equipment Utilized During Treatment: Gait belt Activity Tolerance: Patient tolerated treatment well Patient left: in chair;with call bell/phone within reach;with chair alarm set Nurse Communication: Mobility status PT Visit Diagnosis: Difficulty in walking, not elsewhere classified (R26.2)     Time: 1025-1105 PT Time Calculation (min) (ACUTE ONLY): 40 min  Charges:    $Gait Training: 8-22 mins $Therapeutic Exercise: 8-22 mins PT General Charges $$ ACUTE PT VISIT: 1 Visit                     Mauro Kaufmann PT Acute Rehabilitation Services Pager [phone] Office [phone]    , 08/10/2022, 12:31 PM

## 2022-08-10 NOTE — Progress Notes (Signed)
ANTICOAGULATION CONSULT NOTE   Pharmacy Consult for warfarin Indication: hx afib, CVA and mitral valve repair  Allergies  Allergen Reactions   Sulfa Antibiotics Other (See Comments)    Granulocytosis   Sulfamethoxazole-Trimethoprim     Other Reaction(s): Unknown   Zestril [Lisinopril] Cough   Patient Measurements: Height: 6' (182.9 cm) Weight: 79.5 kg (175 lb 3.2 oz) IBW/kg (Calculated) : 77.6  Vital Signs: Temp: 98 F (36.7 C) (08/02 0530) Temp Source: Oral (08/02 0530) BP: 155/77 (08/02 0530) Pulse Rate: 86 (08/02 0530)  Labs: Recent Labs    08/09/22 0845 08/10/22 0319  HGB  --  9.0*  HCT  --  27.6*  PLT  --  123*  LABPROT 15.5* 16.4*  INR 1.2 1.3*  CREATININE  --  0.82    Estimated Creatinine Clearance: 72.3 mL/min (by C-G formula based on SCr of 0.82 mg/dL).   Medications:  PTA warfarin regimen: 5 mg daily except 7.5 mg on Monday  Per [facility] clinic note on 08/01/22: 08/03/22: Last dose of warfarin.  08/04/22: No warfarin or enoxaparin (Lovenox).  08/05/22: Inject enoxaparin 80mg  in the fatty abdominal tissue at least 2 inches from the belly button twice a day about 12 hours apart, 8am and 8pm rotate sites. No warfarin.  08/06/22: Inject enoxaparin in the fatty tissue every 12 hours, 8am and 8pm. No warfarin.  08/07/22: Inject enoxaparin in the fatty tissue every 12 hours, 8am and 8pm. No warfarin.  08/08/22: Inject enoxaparin in the fatty tissue in the morning at 8 am (No PM dose). No warfarin. 08/09/22: Procedure Day - No enoxaparin - Resume warfarin in the evening or as directed by doctor (take an extra half tablet with usual dose).  08/10/22: Resume enoxaparin inject in the fatty tissue every 12 hours and take warfarin (take an extra half tablet with usual dose then resume normal dose).   08/11/22: Inject enoxaparin in the fatty tissue every 12 hours and take warfarin.  08/12/22: Inject enoxaparin in the fatty tissue every 12 hours and take warfarin. 08/13/22: Inject  enoxaparin in the fatty tissue every 12 hours and take warfarin. 08/14/22: Inject enoxaparin in the fatty tissue every 12 hours and take warfarin. 08/15/22: Inject enoxaparin in the fatty tissue every 12 hours and take warfarin. 08/16/22: Inject enoxaparin in the fatty tissue and warfarin appt to check INR.   Assessment: Patient is an 87 y.o M with hx afib, CVA and mitral valve repair on warfarin PTA who presented to the ED on 08/09/22 for right TKR. Pharmacy has been consulted to resume warfarin post-op.   Today, 08/10/2022: INR = 1.3 remains subtherapeutic as expected after holding prior to surgery CBC: Hgb low post-op (9). Baseline appears to be ~10-11. Plt slightly low Confirmed with RN - no signs of bleeding  SCr WNL  Goal of Therapy:  INR 2-3 Monitor platelets by anticoagulation protocol: Yes   Plan: Follow plan by anticoag clinic for peri-operative dosing Warfarin 7.5 mg PO today Enoxaparin bridge (1 mg/kg SubQ q12h) to start this morning INR daily. CBC with AM labs tomorrow.  Monitor for signs of bleeding  Cindi Carbon, PharmD 08/10/2022,8:45 AM

## 2022-08-13 DIAGNOSIS — R338 Other retention of urine: Secondary | ICD-10-CM | POA: Diagnosis not present

## 2022-08-13 DIAGNOSIS — R330 Drug induced retention of urine: Secondary | ICD-10-CM | POA: Diagnosis not present

## 2022-08-13 DIAGNOSIS — Z471 Aftercare following joint replacement surgery: Secondary | ICD-10-CM | POA: Diagnosis not present

## 2022-08-13 DIAGNOSIS — R262 Difficulty in walking, not elsewhere classified: Secondary | ICD-10-CM | POA: Diagnosis not present

## 2022-08-14 DIAGNOSIS — Z471 Aftercare following joint replacement surgery: Secondary | ICD-10-CM | POA: Diagnosis not present

## 2022-08-14 DIAGNOSIS — R262 Difficulty in walking, not elsewhere classified: Secondary | ICD-10-CM | POA: Diagnosis not present

## 2022-08-14 NOTE — Discharge Summary (Signed)
Patient ID: [name]. MRN: [id number] DOB/AGE: [birth year] 87 y.o.  Admit date: 08/09/2022 Discharge date: 08/10/2022  Admission Diagnoses:  Right knee osteoarthritis  Discharge Diagnoses:  Principal Problem:   S/P total knee arthroplasty, right   Past Medical History:  Diagnosis Date   Acquired dilation of ascending aorta and aortic root (HCC)    43mm aortic root and 41mm ascending aorta on echo 03/2020   Arthritis    Bladder stones    Borderline diabetes    BPH (benign prostatic hyperplasia)    CAD (coronary artery disease) 12/19/2020   Non-obstructive coronary artery disease at cath in 2019   Complication of anesthesia    problem with voiding after anesthesia,   Coronary artery disease    cardiologist-  dr Rockey Situ gerhart NP--- per cath 06-02-2010 non-obstructive cad pLAD 30-40%   CVA (cerebral vascular accident) (HCC) 10/24/2016   Diverticulosis of colon    Dysrhythmia    afib   Gout    Heart murmur    History of adenomatous polyp of colon    2002-- tubular adenoma   History of aortic insufficiency    severe -- s/p  AVR 08-03-2010   History of small bowel obstruction    02/ 2007 mechanical sbo s/p  surgical intervention;  partial sbo 09/ 2011 and 03-20-2011 resolved without surgical intervention   History of urinary retention    HTN (hypertension)    Other secondary pulmonary hypertension (HCC) 03/20/2022   TTE 03/19/22: EF 65-70, no RWMA, moderate LVH, normal RVSF, severe pulmonary hypertension (RVSP 60.4), severe BAE, normal structure and function of mitral valve repair (mean gradient 9), mild TR, trivial AI, normal structure and function of AVR (mean 15.4), moderate PI, aortic root and ascending aorta 40 mm, RAP 15   Peripheral neuropathy    Persistent atrial fibrillation (HCC)    Pre-diabetes    S/P aortic valve replacement with prosthetic valve 08/03/2010   tissue valve   S/P Maze operation for atrial fibrillation 01/30/2018   Complete bilateral  atrial lesion set using bipolar radiofrequency and cryothermy with clipping of LA appendage   S/P MVR (mitral valve repair) 01/30/2018   Complex valvuloplasty including artificial Gore-tex neochord placement x4 and Carbo medics Annuloflex ring annuloplasty, size 28   S/P patent foramen ovale closure 08/03/2010   at same time AVR   S/P tricuspid valve repair 01/30/2018   Using an MC3 Annuloplasty ring, size 28   Stroke (HCC)    Thrombocytopenia (HCC)    Tricuspid regurgitation     Surgeries: Procedure(s): TOTAL KNEE ARTHROPLASTY on 08/09/2022   Consultants:   Discharged Condition: Improved  Hospital Course: [name]. is an 87 y.o. male who was admitted 08/09/2022 for operative treatment ofS/P total knee arthroplasty, right. Patient has severe unremitting pain that affects sleep, daily activities, and work/hobbies. After pre-op clearance the patient was taken to the operating room on 08/09/2022 and underwent  Procedure(s): TOTAL KNEE ARTHROPLASTY.    Patient was given perioperative antibiotics:  Anti-infectives (From admission, onward)    Start     Dose/Rate Route Frequency Ordered Stop   08/10/22 0000  cefadroxil (DURICEF) 500 MG capsule        500 mg Oral 2 times daily 08/10/22 1258 08/15/22 2359   08/09/22 1600  ceFAZolin (ANCEF) IVPB [name]        2 g 200 mL/hr over 30 Minutes Intravenous Every 6 hours 08/09/22 1232 08/09/22 2319   08/09/22 0815  ceFAZolin (  ANCEF) IVPB [name]        2 g 200 mL/hr over 30 Minutes Intravenous On call to O.R. 08/09/22 0813 08/09/22 1019        Patient was given sequential compression devices, early ambulation, and chemoprophylaxis to prevent DVT. Patient worked with PT and was meeting their goals regarding safe ambulation and transfers.  Patient benefited maximally from hospital stay and there were no complications.    Recent vital signs: No data found.   Recent laboratory studies: No results for input(s): "WBC",  "HGB", "HCT", "PLT", "NA", "K", "CL", "CO2", "BUN", "CREATININE", "GLUCOSE", "INR", "CALCIUM" in the last 72 hours.  Invalid input(s): "PT", "2"   Discharge Medications:   Allergies as of 08/10/2022       Reactions   Sulfa Antibiotics Other (See Comments)   Granulocytosis   Sulfamethoxazole-trimethoprim    Other Reaction(s): Unknown   Zestril [lisinopril] Cough        Medication List     STOP taking these medications    gabapentin 300 MG capsule Commonly known as: NEURONTIN       TAKE these medications    allopurinol 300 MG tablet Commonly known as: ZYLOPRIM Take 150 mg by mouth every morning.   amLODipine 10 MG tablet Commonly known as: NORVASC TAKE 1 TABLET BY MOUTH DAILY   atenolol 25 MG tablet Commonly known as: TENORMIN Take 0.5 tablets (12.5 mg total) by mouth daily.   cefadroxil 500 MG capsule Commonly known as: DURICEF Take 1 capsule (500 mg total) by mouth 2 (two) times daily for 5 days.   Centrum Silver Adult 50+ Tabs Take 1 tablet by mouth daily.   cholecalciferol 25 MCG (1000 UNIT) tablet Commonly known as: VITAMIN D3 Take 1,000 Units by mouth daily.   Cinnamon 500 MG capsule Take 500 mg by mouth every morning.   enoxaparin 80 MG/0.8ML injection Commonly known as: LOVENOX Inject 0.8 mLs (80 mg total) into the skin every 12 (twelve) hours.   ferrous sulfate 325 (65 FE) MG EC tablet Take 325 mg by mouth 2 (two) times daily.   finasteride 5 MG tablet Commonly known as: PROSCAR Take 1 tablet (5 mg total) by mouth daily.   methocarbamol 500 MG tablet Commonly known as: ROBAXIN Take 1 tablet (500 mg total) by mouth every 6 (six) hours as needed for muscle spasms (muscle pain).   oxyCODONE 5 MG immediate release tablet Commonly known as: Oxy IR/ROXICODONE Take 0.5-1 tablets (2.5-5 mg total) by mouth every 4 (four) hours as needed for severe pain.   polyethylene glycol 17 g packet Commonly known as: MIRALAX / GLYCOLAX Take 17 g by mouth  2 (two) times daily.   rosuvastatin 10 MG tablet Commonly known as: CRESTOR TAKE 1 TABLET BY MOUTH DAILY   senna 8.6 MG Tabs tablet Commonly known as: SENOKOT Take 2 tablets (17.2 mg total) by mouth at bedtime for 14 days.   silodosin 8 MG Caps capsule Commonly known as: RAPAFLO Take 8 mg by mouth daily.   traZODone 50 MG tablet Commonly known as: DESYREL Take 50 mg by mouth at bedtime as needed for sleep.   warfarin 5 MG tablet Commonly known as: COUMADIN Take as directed. If you are unsure how to take this medication, talk to your nurse or doctor. Original instructions: Take 1 tablet (5 mg total) by mouth daily at 4 PM. Until seen by warfarin clinic. What changed:  how much to take when to take this additional instructions  Discharge Care Instructions  (From admission, onward)           Start     Ordered   08/10/22 0000  Change dressing       Comments: Maintain surgical dressing until follow up in the clinic. If the edges start to pull up, may reinforce with tape. If the dressing is no longer working, may remove and cover with gauze and tape, but must keep the area dry and clean.  Call with any questions or concerns.   08/10/22 0748            Diagnostic Studies: No results found.  Disposition: Discharge disposition: 01-Home or Self Care       Discharge Instructions     Call MD / Call 911   Complete by: As directed    If you experience chest pain or shortness of breath, CALL 911 and be transported to the hospital emergency room.  If you develope a fever above 101 F, pus (white drainage) or increased drainage or redness at the wound, or calf pain, call your surgeon's office.   Change dressing   Complete by: As directed    Maintain surgical dressing until follow up in the clinic. If the edges start to pull up, may reinforce with tape. If the dressing is no longer working, may remove and cover with gauze and tape, but must keep the area  dry and clean.  Call with any questions or concerns.   Constipation Prevention   Complete by: As directed    Drink plenty of fluids.  Prune juice may be helpful.  You may use a stool softener, such as Colace (over the counter) 100 mg twice a day.  Use MiraLax (over the counter) for constipation as needed.   Diet - low sodium heart healthy   Complete by: As directed    Increase activity slowly as tolerated   Complete by: As directed    Weight bearing as tolerated with assist device (walker, cane, etc) as directed, use it as long as suggested by your surgeon or therapist, typically at least 4-6 weeks.   Post-operative opioid taper instructions:   Complete by: As directed    POST-OPERATIVE OPIOID TAPER INSTRUCTIONS: It is important to wean off of your opioid medication as soon as possible. If you do not need pain medication after your surgery it is ok to stop day one. Opioids include: Codeine, Hydrocodone(Norco, Vicodin), Oxycodone(Percocet, oxycontin) and hydromorphone amongst others.  Long term and even short term use of opiods can cause: Increased pain response Dependence Constipation Depression Respiratory depression And more.  Withdrawal symptoms can include Flu like symptoms Nausea, vomiting And more Techniques to manage these symptoms Hydrate well Eat regular healthy meals Stay active Use relaxation techniques(deep breathing, meditating, yoga) Do Not substitute Alcohol to help with tapering If you have been on opioids for less than two weeks and do not have pain than it is ok to stop all together.  Plan to wean off of opioids This plan should start within one week post op of your joint replacement. Maintain the same interval or time between taking each dose and first decrease the dose.  Cut the total daily intake of opioids by one tablet each day Next start to increase the time between doses. The last dose that should be eliminated is the evening dose.      TED hose    Complete by: As directed    Use stockings (TED hose) for 2 weeks on both  leg(s).  You may remove them at night for sleeping.        Follow-up Information     Cassandria Anger, PA-C. Go on 08/22/2022.   Specialty: Orthopedic Surgery Why: You are scheduled for first post op appt on Wednesday August 14 at 12:00pm. Contact information: [address] [place] [address] [phone]                  Signed: Cassandria Anger 08/14/2022, 7:21 AM

## 2022-08-15 ENCOUNTER — Encounter (HOSPITAL_COMMUNITY): Payer: Self-pay | Admitting: Orthopedic Surgery

## 2022-08-15 DIAGNOSIS — Z471 Aftercare following joint replacement surgery: Secondary | ICD-10-CM | POA: Diagnosis not present

## 2022-08-15 DIAGNOSIS — R262 Difficulty in walking, not elsewhere classified: Secondary | ICD-10-CM | POA: Diagnosis not present

## 2022-08-16 ENCOUNTER — Ambulatory Visit: Payer: Medicare Other

## 2022-08-16 DIAGNOSIS — R262 Difficulty in walking, not elsewhere classified: Secondary | ICD-10-CM | POA: Diagnosis not present

## 2022-08-16 DIAGNOSIS — Z471 Aftercare following joint replacement surgery: Secondary | ICD-10-CM | POA: Diagnosis not present

## 2022-08-20 DIAGNOSIS — Z471 Aftercare following joint replacement surgery: Secondary | ICD-10-CM | POA: Diagnosis not present

## 2022-08-20 DIAGNOSIS — R262 Difficulty in walking, not elsewhere classified: Secondary | ICD-10-CM | POA: Diagnosis not present

## 2022-08-21 ENCOUNTER — Telehealth: Payer: Self-pay

## 2022-08-21 DIAGNOSIS — R262 Difficulty in walking, not elsewhere classified: Secondary | ICD-10-CM | POA: Diagnosis not present

## 2022-08-21 DIAGNOSIS — Z471 Aftercare following joint replacement surgery: Secondary | ICD-10-CM | POA: Diagnosis not present

## 2022-08-21 NOTE — Telephone Encounter (Signed)
Faxed order [facility]

## 2022-08-22 DIAGNOSIS — Z471 Aftercare following joint replacement surgery: Secondary | ICD-10-CM | POA: Diagnosis not present

## 2022-08-22 DIAGNOSIS — R262 Difficulty in walking, not elsewhere classified: Secondary | ICD-10-CM | POA: Diagnosis not present

## 2022-08-23 DIAGNOSIS — Z471 Aftercare following joint replacement surgery: Secondary | ICD-10-CM | POA: Diagnosis not present

## 2022-08-23 DIAGNOSIS — R262 Difficulty in walking, not elsewhere classified: Secondary | ICD-10-CM | POA: Diagnosis not present

## 2022-08-23 DIAGNOSIS — D649 Anemia, unspecified: Secondary | ICD-10-CM | POA: Diagnosis not present

## 2022-08-24 ENCOUNTER — Ambulatory Visit: Payer: Medicare Other

## 2022-08-24 DIAGNOSIS — I639 Cerebral infarction, unspecified: Secondary | ICD-10-CM | POA: Diagnosis not present

## 2022-08-24 DIAGNOSIS — I4891 Unspecified atrial fibrillation: Secondary | ICD-10-CM

## 2022-08-24 DIAGNOSIS — Z9889 Other specified postprocedural states: Secondary | ICD-10-CM

## 2022-08-24 DIAGNOSIS — Z7901 Long term (current) use of anticoagulants: Secondary | ICD-10-CM

## 2022-08-24 DIAGNOSIS — Z5181 Encounter for therapeutic drug level monitoring: Secondary | ICD-10-CM | POA: Diagnosis not present

## 2022-08-24 LAB — POCT INR: INR: 1.7 — AB (ref 2.0–3.0)

## 2022-08-24 NOTE — Patient Instructions (Signed)
TAKE 2 TABLETS TODAY ONLY THEN Continue taking warfarin 1 tablet daily except for 1.5 tablets on Mondays.   Stay consistent with greens each week.  Recheck INR in 2 WEEKS Coumadin Clinic [phone]

## 2022-08-27 DIAGNOSIS — R262 Difficulty in walking, not elsewhere classified: Secondary | ICD-10-CM | POA: Diagnosis not present

## 2022-08-27 DIAGNOSIS — Z471 Aftercare following joint replacement surgery: Secondary | ICD-10-CM | POA: Diagnosis not present

## 2022-08-28 DIAGNOSIS — R262 Difficulty in walking, not elsewhere classified: Secondary | ICD-10-CM | POA: Diagnosis not present

## 2022-08-28 DIAGNOSIS — Z471 Aftercare following joint replacement surgery: Secondary | ICD-10-CM | POA: Diagnosis not present

## 2022-08-29 DIAGNOSIS — Z471 Aftercare following joint replacement surgery: Secondary | ICD-10-CM | POA: Diagnosis not present

## 2022-08-29 DIAGNOSIS — R262 Difficulty in walking, not elsewhere classified: Secondary | ICD-10-CM | POA: Diagnosis not present

## 2022-08-30 DIAGNOSIS — R330 Drug induced retention of urine: Secondary | ICD-10-CM | POA: Diagnosis not present

## 2022-08-30 DIAGNOSIS — R488 Other symbolic dysfunctions: Secondary | ICD-10-CM | POA: Diagnosis not present

## 2022-08-30 DIAGNOSIS — R4789 Other speech disturbances: Secondary | ICD-10-CM | POA: Diagnosis not present

## 2022-08-31 DIAGNOSIS — R279 Unspecified lack of coordination: Secondary | ICD-10-CM | POA: Diagnosis not present

## 2022-08-31 DIAGNOSIS — R262 Difficulty in walking, not elsewhere classified: Secondary | ICD-10-CM | POA: Diagnosis not present

## 2022-08-31 DIAGNOSIS — Z471 Aftercare following joint replacement surgery: Secondary | ICD-10-CM | POA: Diagnosis not present

## 2022-09-03 DIAGNOSIS — R262 Difficulty in walking, not elsewhere classified: Secondary | ICD-10-CM | POA: Diagnosis not present

## 2022-09-03 DIAGNOSIS — Z471 Aftercare following joint replacement surgery: Secondary | ICD-10-CM | POA: Diagnosis not present

## 2022-09-04 ENCOUNTER — Ambulatory Visit: Payer: Medicare Other | Attending: Physician Assistant | Admitting: Physician Assistant

## 2022-09-04 ENCOUNTER — Encounter: Payer: Self-pay | Admitting: Physician Assistant

## 2022-09-04 VITALS — BP 118/62 | HR 78 | Ht 72.0 in | Wt 170.6 lb

## 2022-09-04 DIAGNOSIS — R262 Difficulty in walking, not elsewhere classified: Secondary | ICD-10-CM | POA: Diagnosis not present

## 2022-09-04 DIAGNOSIS — I10 Essential (primary) hypertension: Secondary | ICD-10-CM | POA: Diagnosis not present

## 2022-09-04 DIAGNOSIS — I38 Endocarditis, valve unspecified: Secondary | ICD-10-CM | POA: Diagnosis not present

## 2022-09-04 DIAGNOSIS — I4821 Permanent atrial fibrillation: Secondary | ICD-10-CM

## 2022-09-04 DIAGNOSIS — I4891 Unspecified atrial fibrillation: Secondary | ICD-10-CM | POA: Diagnosis not present

## 2022-09-04 DIAGNOSIS — M21372 Foot drop, left foot: Secondary | ICD-10-CM | POA: Diagnosis not present

## 2022-09-04 DIAGNOSIS — Z471 Aftercare following joint replacement surgery: Secondary | ICD-10-CM | POA: Diagnosis not present

## 2022-09-04 DIAGNOSIS — M21371 Foot drop, right foot: Secondary | ICD-10-CM | POA: Diagnosis not present

## 2022-09-04 NOTE — Progress Notes (Addendum)
Cardiology Office Note:    Date:  09/04/2022  ID:  Gerald Lou., DOB [birth year], MRN [id number] PCP: Jarrett Soho, PA-C  [facility] Providers Cardiologist:  Donato Schultz, MD Cardiology APP:  Beatrice Lecher, PA-C       Patient Profile:      Aortic insufficiency; s/p bioprosthetic AVR and PFO closure in 2012 Severe MR, severe TR >> S/p MV Repair, TV Repair, MAZE, LA clipping in 01/2018 TTE 03/22/21: EF 60-65, RVSP 50.8, Asc aorta (36 mm), Ao root (41 mm)  TTE 03/19/22: EF 65-70, no RWMA, mod LVH, NL RVSF, severely elevated PASP, RVSP 60.4, severe BAE, s/p MV repair w trivial MR, mean 9 (NL structure and fxn); s/p TV repair; s/p AVR w trivial AI, no AS, mean 15.4, mod PI, mild dilation of aortic root (40 mm), RAP 15  Pulmonary hypertension (RVSP 03/2022: 60.4) ?related to age, COPD-no further w/u felt to be needed Hypertension Atenolol DCd in 3/22 due to ? HR but resumed due to uncontrolled BP Permanent atrial fibrillation/flutter Chronic anticoagulation w Warfarin Monitor 4/22: 151 SV runs; Mobitz 1, PACs 6.4%, rare PVCs, poss AF/Flutter Hx of R MCA CVA tx with cath based Rx Acute/subacute L frontal CVA 05/2021 Coronary artery disease Non-obstructive by cath in 10/14/17 (pLCx 30, ost LAD 30, pLAD 40) Carotid artery stenosis ([place] 01/20/18: L 1-39) Aortic atherosclerosis  Dilated thoracic aorta Echocardiogram 3/24:  root 40 mm Hx of GI bleed Borderline diabetes mellitus BPH Peripheral neuropathy L hip fracture due to mechanical fall 7/22 >> s/p L THR Low albumin S/p L TKR 09/2021  Anemia   Dr. Truett Perna (heme)           Discussed the use of AI scribe software for clinical note transcription with the patient, who gave verbal consent to proceed.  History of Present Illness   The patient, an 87 year old male with a history of valvular heart disease and atrial fibrillation, presents for routine follow-up. He recently underwent right total knee replacement 08/09/22 and  has been experiencing leg swelling and numbness. He reports that the swelling is improved in the morning, but increases throughout the day. He denies experiencing any symptoms of claudication, but notes occasional pain in his foot.  He denies any shortness of breath, chest pain, or syncope.      ROS:  See HPI No melena, hematochezia.    Studies Reviewed:        Risk Assessment/Calculations:    CHA2DS2-VASc Score = 7   This indicates a 11.2% annual risk of stroke. The patient's score is based upon: CHF History: 1 HTN History: 1 Diabetes History: 0 Stroke History: 2 Vascular Disease History: 1 Age Score: 2 Gender Score: 0            Physical Exam:   VS:  BP 118/62   Pulse 78   Ht 6' (1.829 m)   Wt 170 lb 9.6 oz (77.4 kg)   SpO2 98%   BMI 23.14 kg/m    Wt Readings from Last 3 Encounters:  09/04/22 170 lb 9.6 oz (77.4 kg)  08/09/22 175 lb 3.2 oz (79.5 kg)  07/24/22 175 lb 3.2 oz (79.5 kg)    Constitutional:      Appearance: Healthy appearance. Not in distress.  Neck:     Vascular: No JVR.  Pulmonary:     Breath sounds: Normal breath sounds. No wheezing. No rales.  Cardiovascular:     Normal rate. Regular rhythm.     Murmurs: There  is a grade 2/6 systolic murmur at the URSB.  Edema:    Peripheral edema present.    Pretibial: 1+ edema of the left pretibial area and 2+ edema of the right pretibial area. Abdominal:     Palpations: Abdomen is soft.        Assessment and Plan:     Lower Extremity Edema Patient reports swelling in lower extremities, more pronounced post right total knee replacement. R leg is > L leg since surgery. He has not had symptoms of arterial insufficiency. I suspect he has venous insufficiency, possibly exacerbated by amlodipine. Pulmonary hypertension may be playing somewhat of a role as well contributing to RV dysfunction. However, he is not experiencing dyspnea. -Encouraged use of compression socks and leg elevation. -Consider Lasix as  needed for symptomatic relief, but patient declined at this time.  Permanent Atrial Fibrillation Rate controlled. Chronic anticoagulation with warfarin. Recent INR below target at 1.7. His dose was adjusted. -Continue Atenolol 12.5 mg daily. -Check INR and hemoglobin today.  Valvular Heart Disease History of aortic insufficiency status post bioprosthetic AVR and PFO closure in 2012, severe mitral regurgitation and severe tricuspid regurgitation status post mitral valve repair, tricuspid valve repair, MAZE procedure and left atrial appendage clipping in 2020. Last echo in March 2024 showed severe pulmonary hypertension, normally functioning AVR, tricuspid valve repair and mitral valve repair, moderate pulmonic insufficiency and aortic root size 40 mm. -Continue current management. -Continue SBE prophylaxis. -Plan for repeat echocardiogram in March 2025.  Essential Hypertension Controlled.  -Continue amlodipine 10mg  daily and atenolol 12.5mg  daily. -BMET today.  Coronary Artery Disease Nonobstructive coronary artery disease by cardiac catheterization in 2019. LDL optimal at 77 in May 2024.  -He is not on ASA as he is on Coumadin. -Continue Crestor 10mg  daily.  Anemia Recent drop in hemoglobin to 9 post right total knee replacement. -Check CBC today.         Dispo:  Return in about 7 months (around 04/04/2023) for Routine Follow Up, w/ Tereso Newcomer, PA-C.  Signed, Tereso Newcomer, PA-C

## 2022-09-04 NOTE — Patient Instructions (Signed)
Medication Instructions:  Your physician recommends that you continue on your current medications as directed. Please refer to the Current Medication list given to you today.  *If you need a refill on your cardiac medications before your next appointment, please call your pharmacy*   Lab Work: TODAY: BMET & CBC  If you have labs (blood work) drawn today and your tests are completely normal, you will receive your results only by: MyChart Message (if you have MyChart) OR A paper copy in the mail If you have any lab test that is abnormal or we need to change your treatment, we will call you to review the results.   Testing/Procedures: Your physician has requested that you have an echocardiogram [facility] 2025. Echocardiography is a painless test that uses sound waves to create images of your heart. It provides your doctor with information about the size and shape of your heart and how well your heart's chambers and valves are working. This procedure takes approximately one hour. There are no restrictions for this procedure. Please do NOT wear cologne, perfume, aftershave, or lotions (deodorant is allowed). Please arrive 15 minutes prior to your appointment time.    Follow-Up: At [facility], you and your health needs are our priority.  As part of our continuing mission to provide you with exceptional heart care, we have created designated Provider Care Teams.  These Care Teams include your primary Cardiologist (physician) and Advanced Practice Providers (APPs -  Physician Assistants and Nurse Practitioners) who all work together to provide you with the care you need, when you need it.  We recommend signing up for the patient portal called "MyChart".  Sign up information is provided on this After Visit Summary.  MyChart is used to connect with patients for Virtual Visits (Telemedicine).  Patients are able to view lab/test results, encounter notes, upcoming appointments, etc.  Non-urgent  messages can be sent to your provider as well.   To learn more about what you can do with MyChart, go to [email or web address].    Your next appointment:   AFTER ECHO IN 03/2023  Provider:    Tereso Newcomer, PA-C         Other Instructions

## 2022-09-05 ENCOUNTER — Telehealth: Payer: Self-pay | Admitting: Physician Assistant

## 2022-09-05 DIAGNOSIS — R262 Difficulty in walking, not elsewhere classified: Secondary | ICD-10-CM | POA: Diagnosis not present

## 2022-09-05 DIAGNOSIS — Z471 Aftercare following joint replacement surgery: Secondary | ICD-10-CM | POA: Diagnosis not present

## 2022-09-05 LAB — CBC
Hematocrit: 30.6 % — ABNORMAL LOW (ref 37.5–51.0)
Hemoglobin: 9.3 g/dL — ABNORMAL LOW (ref 13.0–17.7)
MCH: 30.6 pg (ref 26.6–33.0)
MCHC: 30.4 g/dL — ABNORMAL LOW (ref 31.5–35.7)
MCV: 101 fL — ABNORMAL HIGH (ref 79–97)
Platelets: 214 10*3/uL (ref 150–450)
RBC: 3.04 x10E6/uL — ABNORMAL LOW (ref 4.14–5.80)
RDW: 14.7 % (ref 11.6–15.4)
WBC: 4.7 10*3/uL (ref 3.4–10.8)

## 2022-09-05 LAB — BASIC METABOLIC PANEL
BUN/Creatinine Ratio: 14 (ref 10–24)
BUN: 13 mg/dL (ref 8–27)
CO2: 23 mmol/L (ref 20–29)
Calcium: 9.3 mg/dL (ref 8.6–10.2)
Chloride: 105 mmol/L (ref 96–106)
Creatinine, Ser: 0.93 mg/dL (ref 0.76–1.27)
Glucose: 116 mg/dL — ABNORMAL HIGH (ref 70–99)
Potassium: 4.3 mmol/L (ref 3.5–5.2)
Sodium: 143 mmol/L (ref 134–144)
eGFR: 80 mL/min/{1.73_m2} (ref >59)

## 2022-09-05 NOTE — Telephone Encounter (Signed)
Spoke with patient and he is aware of lab results. He verbalized understanding.

## 2022-09-05 NOTE — Telephone Encounter (Signed)
Pt calling for lab results.

## 2022-09-06 DIAGNOSIS — R262 Difficulty in walking, not elsewhere classified: Secondary | ICD-10-CM | POA: Diagnosis not present

## 2022-09-06 DIAGNOSIS — Z471 Aftercare following joint replacement surgery: Secondary | ICD-10-CM | POA: Diagnosis not present

## 2022-09-07 DIAGNOSIS — R262 Difficulty in walking, not elsewhere classified: Secondary | ICD-10-CM | POA: Diagnosis not present

## 2022-09-07 DIAGNOSIS — Z471 Aftercare following joint replacement surgery: Secondary | ICD-10-CM | POA: Diagnosis not present

## 2022-09-11 ENCOUNTER — Telehealth: Payer: Self-pay | Admitting: Physician Assistant

## 2022-09-11 NOTE — Telephone Encounter (Signed)
-----   Message from Tereso Newcomer sent at 09/10/2022  6:32 PM EDT ----- Patient did not view MyChart comments.  Please notify patient of results. Tereso Newcomer, PA-C    09/10/2022 6:32 PM

## 2022-09-11 NOTE — Telephone Encounter (Signed)
Pt returning call for lab results  

## 2022-09-11 NOTE — Telephone Encounter (Signed)
Returned call to pt, left a message for pt to call back.  

## 2022-09-12 DIAGNOSIS — R8271 Bacteriuria: Secondary | ICD-10-CM | POA: Diagnosis not present

## 2022-09-12 DIAGNOSIS — R3914 Feeling of incomplete bladder emptying: Secondary | ICD-10-CM | POA: Diagnosis not present

## 2022-09-12 NOTE — Telephone Encounter (Signed)
Left the pt a message to call the office back. 

## 2022-09-12 NOTE — Telephone Encounter (Signed)
Returned call to pt, left another message for pt to call back.  If pt calls, please send call to me as he is hard to catch.

## 2022-09-12 NOTE — Telephone Encounter (Signed)
Patient is returning call and is requesting return call.

## 2022-09-12 NOTE — Telephone Encounter (Signed)
Patient returned staff call.  Reached out to [place] as requested.

## 2022-09-13 NOTE — Telephone Encounter (Signed)
-----   Message from Tereso Newcomer sent at 09/10/2022  6:32 PM EDT ----- Patient did not view MyChart comments.  Please notify patient of results. Tereso Newcomer, PA-C    09/10/2022 6:32 PM

## 2022-09-13 NOTE — Telephone Encounter (Signed)
Returned call to pt. He has bee made aware of his lab results / recommendations. See result note.

## 2022-09-14 ENCOUNTER — Ambulatory Visit: Payer: Medicare Other | Attending: Cardiology

## 2022-09-14 DIAGNOSIS — Z9889 Other specified postprocedural states: Secondary | ICD-10-CM

## 2022-09-14 DIAGNOSIS — Z5181 Encounter for therapeutic drug level monitoring: Secondary | ICD-10-CM

## 2022-09-14 DIAGNOSIS — I4891 Unspecified atrial fibrillation: Secondary | ICD-10-CM

## 2022-09-14 DIAGNOSIS — I639 Cerebral infarction, unspecified: Secondary | ICD-10-CM

## 2022-09-14 DIAGNOSIS — Z7901 Long term (current) use of anticoagulants: Secondary | ICD-10-CM

## 2022-09-14 LAB — POCT INR: INR: 2.7 (ref 2.0–3.0)

## 2022-09-14 NOTE — Patient Instructions (Signed)
Continue taking warfarin 1 tablet daily except for 1.5 tablets on Mondays.   Stay consistent with greens each week.  Recheck INR in 4 WEEKS Coumadin Clinic [phone]

## 2022-09-19 ENCOUNTER — Other Ambulatory Visit: Payer: Self-pay | Admitting: Cardiology

## 2022-09-19 ENCOUNTER — Other Ambulatory Visit: Payer: Self-pay | Admitting: Urology

## 2022-09-25 DIAGNOSIS — R262 Difficulty in walking, not elsewhere classified: Secondary | ICD-10-CM | POA: Diagnosis not present

## 2022-09-25 DIAGNOSIS — Z471 Aftercare following joint replacement surgery: Secondary | ICD-10-CM | POA: Diagnosis not present

## 2022-09-26 DIAGNOSIS — Z471 Aftercare following joint replacement surgery: Secondary | ICD-10-CM | POA: Diagnosis not present

## 2022-09-26 DIAGNOSIS — Z96651 Presence of right artificial knee joint: Secondary | ICD-10-CM | POA: Diagnosis not present

## 2022-09-26 DIAGNOSIS — R262 Difficulty in walking, not elsewhere classified: Secondary | ICD-10-CM | POA: Diagnosis not present

## 2022-09-27 ENCOUNTER — Encounter: Payer: Self-pay | Admitting: Family Medicine

## 2022-09-27 DIAGNOSIS — Z471 Aftercare following joint replacement surgery: Secondary | ICD-10-CM | POA: Diagnosis not present

## 2022-09-27 DIAGNOSIS — R262 Difficulty in walking, not elsewhere classified: Secondary | ICD-10-CM | POA: Diagnosis not present

## 2022-09-28 DIAGNOSIS — R262 Difficulty in walking, not elsewhere classified: Secondary | ICD-10-CM | POA: Diagnosis not present

## 2022-09-28 DIAGNOSIS — Z471 Aftercare following joint replacement surgery: Secondary | ICD-10-CM | POA: Diagnosis not present

## 2022-10-01 DIAGNOSIS — Z471 Aftercare following joint replacement surgery: Secondary | ICD-10-CM | POA: Diagnosis not present

## 2022-10-01 DIAGNOSIS — R262 Difficulty in walking, not elsewhere classified: Secondary | ICD-10-CM | POA: Diagnosis not present

## 2022-10-02 DIAGNOSIS — R262 Difficulty in walking, not elsewhere classified: Secondary | ICD-10-CM | POA: Diagnosis not present

## 2022-10-02 DIAGNOSIS — Z471 Aftercare following joint replacement surgery: Secondary | ICD-10-CM | POA: Diagnosis not present

## 2022-10-03 DIAGNOSIS — Z471 Aftercare following joint replacement surgery: Secondary | ICD-10-CM | POA: Diagnosis not present

## 2022-10-03 DIAGNOSIS — R262 Difficulty in walking, not elsewhere classified: Secondary | ICD-10-CM | POA: Diagnosis not present

## 2022-10-04 DIAGNOSIS — R262 Difficulty in walking, not elsewhere classified: Secondary | ICD-10-CM | POA: Diagnosis not present

## 2022-10-04 DIAGNOSIS — Z471 Aftercare following joint replacement surgery: Secondary | ICD-10-CM | POA: Diagnosis not present

## 2022-10-08 DIAGNOSIS — Z471 Aftercare following joint replacement surgery: Secondary | ICD-10-CM | POA: Diagnosis not present

## 2022-10-08 DIAGNOSIS — R262 Difficulty in walking, not elsewhere classified: Secondary | ICD-10-CM | POA: Diagnosis not present

## 2022-10-09 DIAGNOSIS — Z471 Aftercare following joint replacement surgery: Secondary | ICD-10-CM | POA: Diagnosis not present

## 2022-10-09 DIAGNOSIS — R262 Difficulty in walking, not elsewhere classified: Secondary | ICD-10-CM | POA: Diagnosis not present

## 2022-10-10 DIAGNOSIS — R262 Difficulty in walking, not elsewhere classified: Secondary | ICD-10-CM | POA: Diagnosis not present

## 2022-10-10 DIAGNOSIS — Z471 Aftercare following joint replacement surgery: Secondary | ICD-10-CM | POA: Diagnosis not present

## 2022-10-12 ENCOUNTER — Ambulatory Visit: Payer: Medicare Other | Attending: Cardiology

## 2022-10-12 DIAGNOSIS — Z7901 Long term (current) use of anticoagulants: Secondary | ICD-10-CM | POA: Diagnosis not present

## 2022-10-12 DIAGNOSIS — I4891 Unspecified atrial fibrillation: Secondary | ICD-10-CM

## 2022-10-12 DIAGNOSIS — I639 Cerebral infarction, unspecified: Secondary | ICD-10-CM | POA: Diagnosis not present

## 2022-10-12 DIAGNOSIS — Z9889 Other specified postprocedural states: Secondary | ICD-10-CM | POA: Diagnosis not present

## 2022-10-12 DIAGNOSIS — Z5181 Encounter for therapeutic drug level monitoring: Secondary | ICD-10-CM | POA: Diagnosis not present

## 2022-10-12 LAB — POCT INR: INR: 2.7 (ref 2.0–3.0)

## 2022-10-12 NOTE — Patient Instructions (Signed)
Continue taking warfarin 1 tablet daily except for 1.5 tablets on Mondays.   Stay consistent with greens each week.  Recheck INR in 6 WEEKS Coumadin Clinic [phone]

## 2022-10-15 ENCOUNTER — Inpatient Hospital Stay: Payer: Medicare Other | Attending: Oncology

## 2022-10-15 ENCOUNTER — Inpatient Hospital Stay: Payer: Medicare Other | Admitting: Oncology

## 2022-10-15 DIAGNOSIS — I2510 Atherosclerotic heart disease of native coronary artery without angina pectoris: Secondary | ICD-10-CM | POA: Insufficient documentation

## 2022-10-15 DIAGNOSIS — D649 Anemia, unspecified: Secondary | ICD-10-CM | POA: Insufficient documentation

## 2022-10-15 DIAGNOSIS — I4891 Unspecified atrial fibrillation: Secondary | ICD-10-CM | POA: Insufficient documentation

## 2022-10-15 DIAGNOSIS — R262 Difficulty in walking, not elsewhere classified: Secondary | ICD-10-CM | POA: Diagnosis not present

## 2022-10-15 DIAGNOSIS — D696 Thrombocytopenia, unspecified: Secondary | ICD-10-CM | POA: Insufficient documentation

## 2022-10-15 DIAGNOSIS — Z471 Aftercare following joint replacement surgery: Secondary | ICD-10-CM | POA: Diagnosis not present

## 2022-10-16 DIAGNOSIS — Z23 Encounter for immunization: Secondary | ICD-10-CM | POA: Diagnosis not present

## 2022-10-24 DIAGNOSIS — Z471 Aftercare following joint replacement surgery: Secondary | ICD-10-CM | POA: Diagnosis not present

## 2022-10-24 DIAGNOSIS — R262 Difficulty in walking, not elsewhere classified: Secondary | ICD-10-CM | POA: Diagnosis not present

## 2022-10-25 DIAGNOSIS — Z471 Aftercare following joint replacement surgery: Secondary | ICD-10-CM | POA: Diagnosis not present

## 2022-10-25 DIAGNOSIS — R262 Difficulty in walking, not elsewhere classified: Secondary | ICD-10-CM | POA: Diagnosis not present

## 2022-10-26 DIAGNOSIS — R262 Difficulty in walking, not elsewhere classified: Secondary | ICD-10-CM | POA: Diagnosis not present

## 2022-10-26 DIAGNOSIS — Z471 Aftercare following joint replacement surgery: Secondary | ICD-10-CM | POA: Diagnosis not present

## 2022-10-30 DIAGNOSIS — Z471 Aftercare following joint replacement surgery: Secondary | ICD-10-CM | POA: Diagnosis not present

## 2022-10-30 DIAGNOSIS — R262 Difficulty in walking, not elsewhere classified: Secondary | ICD-10-CM | POA: Diagnosis not present

## 2022-11-08 ENCOUNTER — Inpatient Hospital Stay: Payer: Medicare Other | Admitting: Oncology

## 2022-11-08 ENCOUNTER — Inpatient Hospital Stay: Payer: Medicare Other

## 2022-11-08 VITALS — BP 142/76 | HR 76 | Temp 98.1°F | Resp 18 | Ht 72.0 in | Wt 176.0 lb

## 2022-11-08 DIAGNOSIS — D696 Thrombocytopenia, unspecified: Secondary | ICD-10-CM | POA: Diagnosis not present

## 2022-11-08 DIAGNOSIS — D649 Anemia, unspecified: Secondary | ICD-10-CM | POA: Diagnosis not present

## 2022-11-08 DIAGNOSIS — I2510 Atherosclerotic heart disease of native coronary artery without angina pectoris: Secondary | ICD-10-CM | POA: Diagnosis not present

## 2022-11-08 DIAGNOSIS — I4891 Unspecified atrial fibrillation: Secondary | ICD-10-CM | POA: Diagnosis not present

## 2022-11-08 LAB — CBC WITH DIFFERENTIAL (CANCER CENTER ONLY)
Abs Immature Granulocytes: 0.01 10*3/uL (ref 0.00–0.07)
Basophils Absolute: 0 10*3/uL (ref 0.0–0.1)
Basophils Relative: 1 %
Eosinophils Absolute: 0.2 10*3/uL (ref 0.0–0.5)
Eosinophils Relative: 3 %
HCT: 31 % — ABNORMAL LOW (ref 39.0–52.0)
Hemoglobin: 10.1 g/dL — ABNORMAL LOW (ref 13.0–17.0)
Immature Granulocytes: 0 %
Lymphocytes Relative: 24 %
Lymphs Abs: 1.3 10*3/uL (ref 0.7–4.0)
MCH: 30.1 pg (ref 26.0–34.0)
MCHC: 32.6 g/dL (ref 30.0–36.0)
MCV: 92.5 fL (ref 80.0–100.0)
Monocytes Absolute: 0.6 10*3/uL (ref 0.1–1.0)
Monocytes Relative: 12 %
Neutro Abs: 3.1 10*3/uL (ref 1.7–7.7)
Neutrophils Relative %: 60 %
Platelet Count: 149 10*3/uL — ABNORMAL LOW (ref 150–400)
RBC: 3.35 MIL/uL — ABNORMAL LOW (ref 4.22–5.81)
RDW: 16 % — ABNORMAL HIGH (ref 11.5–15.5)
WBC Count: 5.2 10*3/uL (ref 4.0–10.5)
nRBC: 0 % (ref 0.0–0.2)

## 2022-11-08 LAB — SAMPLE TO BLOOD BANK

## 2022-11-08 LAB — FERRITIN: Ferritin: 95 ng/mL (ref 24–336)

## 2022-11-08 NOTE — Progress Notes (Signed)
  [facility] OFFICE PROGRESS NOTE   Diagnosis: Anemia, thrombocytopenia  INTERVAL HISTORY:   [name] returns as scheduled.  He underwent right knee replacement surgery 08/09/2022.  He has completed physical therapy for the right knee.  He feels well.  He reports feeling unsteady as though his balance is off when he first stands up.  He is better after ambulating.  No bleeding.  Objective:  Vital signs in last 24 hours:  Blood pressure (!) 142/76, pulse 76, temperature 98.1 F (36.7 C), temperature source Temporal, resp. rate 18, height 6' (1.829 m), weight 176 lb (79.8 kg), SpO2 96%.     Lymphatics: No cervical, supraclavicular, axillary, or inguinal nodes Resp: Lungs clear bilaterally Cardio: Irregular GI: No hepatosplenomegaly Vascular: Trace ankle edema bilaterally Neuro: Alert and oriented, finger-nose testing is normal, he ambulates independently    Portacath/PICC-without erythema  Lab Results:  Lab Results  Component Value Date   WBC 5.2 11/08/2022   HGB 10.1 (L) 11/08/2022   HCT 31.0 (L) 11/08/2022   MCV 92.5 11/08/2022   PLT 149 (L) 11/08/2022   NEUTROABS 3.1 11/08/2022    CMP  Lab Results  Component Value Date   NA 143 09/04/2022   K 4.3 09/04/2022   CL 105 09/04/2022   CO2 23 09/04/2022   GLUCOSE 116 (H) 09/04/2022   BUN 13 09/04/2022   CREATININE 0.93 09/04/2022   CALCIUM 9.3 09/04/2022   PROT 7.4 02/16/2022   ALBUMIN 4.1 02/16/2022   AST 30 02/16/2022   ALT 27 02/16/2022   ALKPHOS 49 02/16/2022   BILITOT 1.1 02/16/2022   GFRNONAA >60 08/10/2022   GFRAA 83 11/25/2019   Medications: I have reviewed the patient's current medications.   Assessment/Plan: Anemia Normal ferritin, normal vitamin B12, and negative stool Hemoccult cards January 2023 Left hip fracture July 2022 status post left hip replacement  Hospitalization August 2020 with symptomatic anemia, heme positive stool, ferritin 19, status post EGD;  colonoscopy  10/21/2018, gastritis with hemorrhage Atrial fibrillation on Coumadin CAD AVR Mitral valve repair Left total knee replacement 09/19/2021 Admission with urinary retention 09/23/2021 Chronic mild thrombocytopenia     Disposition: [name] has mild chronic thrombocytopenia.  The platelet count is in the low normal range.  The hemoglobin dropped acutely following the knee replacement surgery in August and has partially recovered.  I suspect the chronic mild anemia is related to subclinical blood loss while on Coumadin.  He may have early myelodysplasia.  The plan is to continue observation.  He will return for an office and lab visit in 3 months.  Thornton Papas, MD  11/08/2022  9:08 AM

## 2022-11-09 ENCOUNTER — Ambulatory Visit: Payer: Medicare Other | Admitting: Physician Assistant

## 2022-11-23 ENCOUNTER — Ambulatory Visit: Payer: Medicare Other | Attending: Internal Medicine

## 2022-11-23 DIAGNOSIS — Z9889 Other specified postprocedural states: Secondary | ICD-10-CM

## 2022-11-23 DIAGNOSIS — I4891 Unspecified atrial fibrillation: Secondary | ICD-10-CM | POA: Diagnosis not present

## 2022-11-23 DIAGNOSIS — I639 Cerebral infarction, unspecified: Secondary | ICD-10-CM | POA: Diagnosis not present

## 2022-11-23 DIAGNOSIS — Z7901 Long term (current) use of anticoagulants: Secondary | ICD-10-CM

## 2022-11-23 DIAGNOSIS — Z5181 Encounter for therapeutic drug level monitoring: Secondary | ICD-10-CM

## 2022-11-23 LAB — POCT INR: INR: 2.8 (ref 2.0–3.0)

## 2022-11-23 NOTE — Patient Instructions (Signed)
Continue taking warfarin 1 tablet daily except for 1.5 tablets on Mondays.   Stay consistent with greens each week.  Recheck INR in 8 WEEKS [facility] [phone]

## 2022-12-25 ENCOUNTER — Telehealth: Payer: Self-pay | Admitting: Adult Health

## 2022-12-25 NOTE — Telephone Encounter (Signed)
 MYC confirmation

## 2022-12-26 ENCOUNTER — Encounter: Payer: Self-pay | Admitting: Adult Health

## 2022-12-26 ENCOUNTER — Ambulatory Visit: Payer: Medicare Other | Admitting: Adult Health

## 2022-12-26 ENCOUNTER — Telehealth: Payer: Self-pay | Admitting: Neurology

## 2022-12-26 NOTE — Telephone Encounter (Signed)
Pt called to r/s missed appt. Rescheduled and Wait listed

## 2022-12-26 NOTE — Progress Notes (Deleted)
[facility] [address] [place]. [place] [address] [phone]       STROKE FOLLOW UP NOTE  [name]. Date of Birth:  08-20-1935 Medical Record Number:  [id number]   Reason for Referral: stroke follow up    SUBJECTIVE:   CHIEF COMPLAINT:  No chief complaint on file.   HPI:   Update 12/26/2022 JM: Patient returns for follow-up visit.  At prior visit, reported continued BLE numbness, increased gabapentin as requested. He was seen by [name] in 06/2022 for gait abnormality and lower extremity paresthesia, DDx lumbar radiculopathy vs peripheral neuropathy.  Recommended MRI lumbar spine and EMG/NCV but declined interest.  Referral placed to PT.   Overall doing well without new stroke/TIA symptoms.  Remains on warfarin and Crestor.  Routinely follows with PCP and cardiology for stroke risk factor management.     History provided for reference purposes only Update 11/28/2021 JM: Patient returns for 59-month stroke follow-up.  He has been overall stable from stroke standpoint.  Continues to have imbalance. Stable since prior visit. He and his wife have since moved into [employer] retirement community. Just recently started participating in a fitness program which he hopes will further improve his balance. Denies any new stroke/TIA symptoms.  Remains on warfarin and Crestor.  Blood pressure well controlled.  Routinely follows with cardiology and PCP.  Continues to have BLE neuropathy, relatively stable since prior visit. Continues to have numbness, no significant pain. Currently on gabapentin 100/300, he questions further increasing to 300mg  BID.   Initial visit 07/18/2021 JM: [name] is being seen for initial hospital follow-up accompanied by his wife.  He was previously seen in office 04/2017 and lost to follow-up after that time.  Overall he has been doing well since discharge.  He has concerns regarding his balance is not quite at baseline.  Does still have some issues with left ear, has evaluation with ENT in a couple of months. Was seen by home health therapy only once and was cleared after that. He does mention longstanding history of neuropathy (+10 years) currently being treated with gabapentin 200 mg nightly, tolerates well but limited benefit. Symptoms typically worse at night. Neuropathy has been relatively stable over the past 2 years without progression.  Ambulates without assistive device.  Denies any stroke/TIA symptoms since discharge.  Compliant on warfarin and Crestor, denies side effects.  Blood pressure today 130/78.  Routinely followed by cardiology and Coumadin clinic for warfarin management and has since had follow-up with PCP.   No further concerns at this time  Stroke admission 05/21/2021 [name]. is a 87 y.o. 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  Etiology found to be sepsis suspected source being tooth abscess.  MRI brain showed small left frontal DWI abnormality cortically with questionable ADC correlate.  CTA head/neck negative LVO, diffuse stenosis involving mid right P2 segment.  EF 60 to 65%, LVH and severely dilated left atrium.  Evaluated by neurologist [name] for guidance for anticoagulation. Per consult note, the size and location of acute infarct would not explain presenting symptoms and does not preclude him from restarting anticoagulation for A-fib as he is at low risk for hemorrhagic conversion.  Warfarin restarted at 5 mg daily.  LDL 77 -  recommend continuation of Crestor 10 mg daily.  A1c 5.7, recommend close follow-up with PCP for monitoring. Treated for SIRS and otitis externa by primary team and tooth pain with concern of abscess treated and advised dentist  follow-up.  He was discharged home on 5/17 stable condition with recommended [facility] PT/OT.     PERTINENT IMAGING  CT-head:  Chronic right temporal lobe atrophy secondary to RMCA infarction. Moderate atrophy throughout   CTA head and neck  1. Negative CTA for large vessel occlusion or other emergent finding. 2. Moderate diffuse stenosis involving the mid right P2 segment. 3. Additional mild for age atheromatous change elsewhere about the major arterial vascular of the head and neck. No other proximal high-grade or correctable stenosis. 4. Diffuse arterial vascular tortuosity, suggesting chronic underlying hypertension   MRI brain:  Acute, tiny, cortical left frontal lobe infarction; chronic right temporal lobe infarction with surrounding encephalomalacia/cortical atrophy, which is present throughout parenchyma as well. Chronic left occipital infarction; left cerebellar and cebrum bilaterally with microhemorrhages    Recent TTE: 60-65 % EF, LVH, severely dilated left atrial       ROS:   14 system review of systems performed and negative with exception of those listed in HPI  PMH:  Past Medical History:  Diagnosis Date   Acquired dilation of ascending aorta and aortic root (HCC)    43mm aortic root and 41mm ascending aorta on echo 03/2020   Arthritis    Bladder stones    Borderline diabetes    BPH (benign prostatic hyperplasia)    CAD (coronary artery disease) 12/19/2020   Non-obstructive coronary artery disease at cath in 2019   Complication of anesthesia    problem with voiding after anesthesia,   Coronary artery disease    cardiologist-  dr Rockey Situ gerhart NP--- per cath 06-02-2010 non-obstructive cad pLAD 30-40%   CVA (cerebral vascular accident) (HCC) 10/24/2016   Diverticulosis of colon    Dysrhythmia    afib   Gout    Heart murmur    History of adenomatous polyp of colon    2002-- tubular adenoma   History of aortic insufficiency    severe -- s/p  AVR  08-03-2010   History of small bowel obstruction    02/ 2007 mechanical sbo s/p  surgical intervention;  partial sbo 09/ 2011 and 03-20-2011 resolved without surgical intervention   History of urinary retention    HTN (hypertension)    Other secondary pulmonary hypertension (HCC) 03/20/2022   TTE 03/19/22: EF 65-70, no RWMA, moderate LVH, normal RVSF, severe pulmonary hypertension (RVSP 60.4), severe BAE, normal structure and function of mitral valve repair (mean gradient 9), mild TR, trivial AI, normal structure and function of AVR (mean 15.4), moderate PI, aortic root and ascending aorta 40 mm, RAP 15   Peripheral neuropathy    Persistent atrial fibrillation (HCC)    Pre-diabetes    S/P aortic valve replacement with prosthetic valve 08/03/2010   tissue valve   S/P Maze operation for atrial fibrillation 01/30/2018   Complete bilateral atrial lesion set using bipolar radiofrequency and cryothermy with clipping of LA appendage   S/P MVR (mitral valve repair) 01/30/2018   Complex valvuloplasty including artificial Gore-tex neochord placement x4 and Carbo medics Annuloflex ring annuloplasty, size 28   S/P patent foramen ovale closure 08/03/2010   at same time AVR   S/P tricuspid valve repair 01/30/2018   Using an MC3 Annuloplasty ring, size 28   Stroke (HCC)    Thrombocytopenia (  HCC)    Tricuspid regurgitation     PSH:  Past Surgical History:  Procedure Laterality Date   BIOPSY  09/07/2018   Procedure: BIOPSY;  Surgeon: Kathi Der, MD;  Location: [facility];  Service: Gastroenterology;;   CARDIAC CATHETERIZATION  06-02-2010  dr Anne Fu   non-obstructive cad- pLAD 30-40%/  normal LVSF/  severe AI   CARDIOVASCULAR STRESS TEST  04/12/2016   Low risk nuclear perfusion study w/ no significant reversible ischemia/  normal LV function and wall motion ,  stress ef 60%/  2mm inferior and lateral scooped ST-segment depression w/ exercise (may be repolarization abnormality), exercise capacity  was moderately reduced   CATARACT EXTRACTION W/ INTRAOCULAR LENS  IMPLANT, BILATERAL  02/2010   CLIPPING OF ATRIAL APPENDAGE N/A 01/30/2018   Procedure: CLIPPING OF LEFT ATRIAL APPENDAGE USING ATRICLIP PRO2 ;  Surgeon: Purcell Nails, MD;  Location: [facility] OR;  Service: Open Heart Surgery;  Laterality: N/A;   COLONOSCOPY WITH PROPOFOL N/A 10/21/2018   Procedure: COLONOSCOPY WITH PROPOFOL;  Surgeon: Kathi Der, MD;  Location: [facility];  Service: Gastroenterology;  Laterality: N/A;   CYSTOSCOPY WITH LITHOLAPAXY N/A 06/05/2016   Procedure: CYSTOSCOPY WITH LITHOLAPAXY and fulgarization of bladder neck;  Surgeon: Bjorn Pippin, MD;  Location: [facility];  Service: Urology;  Laterality: N/A;   ESOPHAGOGASTRODUODENOSCOPY (EGD) WITH PROPOFOL N/A 09/07/2018   Procedure: ESOPHAGOGASTRODUODENOSCOPY (EGD) WITH PROPOFOL;  Surgeon: Kathi Der, MD;  Location: [facility];  Service: Gastroenterology;  Laterality: N/A;   EXPLORATORY LAPARTOMY /  CHOLECYSTECTOMY  02/28/2005   for Small  bowel obstruction (mechnical)   IR ANGIO EXTRACRAN SEL COM CAROTID INNOMINATE UNI L MOD SED  10/24/2016   IR ANGIO VERTEBRAL SEL SUBCLAVIAN INNOMINATE BILAT MOD SED  10/24/2016   IR PERCUTANEOUS ART THROMBECTOMY/INFUSION INTRACRANIAL INC DIAG ANGIO  10/24/2016   IR RADIOLOGIST EVAL & MGMT  12/05/2016   LEFT KNEE ARTHROSCOPY  2006   MAZE N/A 01/30/2018   Procedure: MAZE;  Surgeon: Purcell Nails, MD;  Location: [facility] OR;  Service: Open Heart Surgery;  Laterality: N/A;   MITRAL VALVE REPAIR N/A 01/30/2018   Procedure: MITRAL VALVE REPAIR (MVR) USING CARBOMEDICS ANNULOFLEX SIZE 28;  Surgeon: Purcell Nails, MD;  Location: [facility] OR;  Service: Open Heart Surgery;  Laterality: N/A;   POLYPECTOMY  10/21/2018   Procedure: POLYPECTOMY;  Surgeon: Kathi Der, MD;  Location: [facility];  Service: Gastroenterology;;   RADIOLOGY WITH ANESTHESIA N/A 10/24/2016   Procedure: RADIOLOGY WITH ANESTHESIA;   Surgeon: Julieanne Cotton, MD;  Location: [facility] OR;  Service: Radiology;  Laterality: N/A;   RIGHT FOOT SURGERY     RIGHT MINIATURE ANTERIOR THORACOTOMY FOR AORTIC VALVE REPLACEMENT AND CLOSURE PATENT FORAMEN OVALE  08-03-2010  DR Neldon Labella Magna-ease pericardial tissue valve (25mm)   RIGHT/LEFT HEART CATH AND CORONARY ANGIOGRAPHY N/A 10/14/2017   Procedure: RIGHT/LEFT HEART CATH AND CORONARY ANGIOGRAPHY;  Surgeon: Tonny Bollman, MD;  [facility] [place] INVASIVE CV LAB;  Service: Cardiovascular;  Laterality: N/A;   TEE WITHOUT CARDIOVERSION N/A 10/14/2017   Procedure: TRANSESOPHAGEAL ECHOCARDIOGRAM (TEE);  Surgeon: Wendall Stade, MD;  Location: [facility] ENDOSCOPY;  Service: Cardiovascular;  Laterality: N/A;   TOTAL HIP ARTHROPLASTY Left 08/02/2020   Procedure: TOTAL HIP ARTHROPLASTY ANTERIOR APPROACH;  Surgeon: Durene Romans, MD;  Location: [facility];  Service: Orthopedics;  Laterality: Left;   TOTAL KNEE ARTHROPLASTY Left 09/19/2021   Procedure: TOTAL KNEE ARTHROPLASTY;  Surgeon: Durene Romans, MD;  Location: [facility];  Service: Orthopedics;  Laterality: Left;  TOTAL KNEE ARTHROPLASTY Right 08/09/2022   Procedure: TOTAL KNEE ARTHROPLASTY;  Surgeon: Durene Romans, MD;  Location: [facility];  Service: Orthopedics;  Laterality: Right;  90   TRANSTHORACIC ECHOCARDIOGRAM  05/30/2016  dr skains   moderate  LVH ef 60-65%/  bioprothesis aortic valve present ,normal grandient and no AI /  mild MV calcification , moderate MR /  mild PR/ moderate TR/  PASP 52mmHg/ (RA denisty was identified 04-27-2016 echo) and is seen again today, this is likely a promient eustacian ridge, atrium is normal size   TRICUSPID VALVE REPLACEMENT N/A 01/30/2018   Procedure: TRICUSPID VALVE REPAIR USING MC3 SIZE 28;  Surgeon: Purcell Nails, MD;  Location: [facility] OR;  Service: Open Heart Surgery;  Laterality: N/A;    Social History:  Social History   Socioeconomic History   Marital status: Married    Spouse name: Roberta   Number of children:  2   Years of education: Not on file   Highest education level: Not on file  Occupational History   Occupation: RETIRED  Tobacco Use   Smoking status: Never   Smokeless tobacco: Never  Vaping Use   Vaping status: Never Used  Substance and Sexual Activity   Alcohol use: Yes    Comment: ONE OR TWO PER MONTH   Drug use: No   Sexual activity: Not Currently  Other Topics Concern   Not on file  Social History Narrative   Not on file   Social Drivers of Health   Financial Resource Strain: Low Risk  (06/16/2021)   Overall Financial Resource Strain (CARDIA)    Difficulty of Paying Living Expenses: Not hard at all  Food Insecurity: No Food Insecurity (08/09/2022)   Hunger Vital Sign    Worried About Running Out of Food in the Last Year: Never true    Ran Out of Food in the Last Year: Never true  Transportation Needs: No Transportation Needs (08/09/2022)   PRAPARE - Administrator, Civil Service (Medical): No    Lack of Transportation (Non-Medical): No  Physical Activity: Not on file  Stress: No Stress Concern Present (06/16/2021)   [employer] of Occupational Health - Occupational Stress Questionnaire    Feeling of Stress : Only a little  Social Connections: Unknown (06/02/2021)   Social Connection and Isolation Panel [NHANES]    Frequency of Communication with Friends and Family: Twice a week    Frequency of Social Gatherings with Friends and Family: Twice a week    Attends Religious Services: Patient declined    Database administrator or Organizations: Patient declined    Attends Banker Meetings: Patient declined    Marital Status: Married  Catering manager Violence: Not At Risk (08/09/2022)   Humiliation, Afraid, Rape, and Kick questionnaire    Fear of Current or Ex-Partner: No    Emotionally Abused: No    Physically Abused: No    Sexually Abused: No    Family History:  Family History  Problem Relation Age of Onset   Heart disease Mother    Brain  cancer Father     Medications:   Current Outpatient Medications on File Prior to Visit  Medication Sig Dispense Refill   allopurinol (ZYLOPRIM) 300 MG tablet Take 150 mg by mouth every morning.     amLODipine (NORVASC) 10 MG tablet TAKE 1 TABLET BY MOUTH DAILY 100 tablet 3   atenolol (TENORMIN) 25 MG tablet TAKE ONE-HALF TABLET BY MOUTH  DAILY 50 tablet 3  cholecalciferol (VITAMIN D3) 25 MCG (1000 UNIT) tablet Take 1,000 Units by mouth daily.     Cinnamon 500 MG capsule Take 500 mg by mouth every morning.     ferrous sulfate 325 (65 FE) MG EC tablet Take 325 mg by mouth 2 (two) times daily.     finasteride (PROSCAR) 5 MG tablet Take 1 tablet (5 mg total) by mouth daily. 30 tablet 0   Multiple Vitamins-Minerals (CENTRUM SILVER ADULT 50+) TABS Take 1 tablet by mouth daily.     rosuvastatin (CRESTOR) 10 MG tablet TAKE 1 TABLET BY MOUTH DAILY 100 tablet 3   silodosin (RAPAFLO) 8 MG CAPS capsule Take 8 mg by mouth daily.     traZODone (DESYREL) 50 MG tablet Take 50 mg by mouth at bedtime as needed for sleep.     warfarin (COUMADIN) 5 MG tablet Take one tablet  5 mg by mouth everyother day except Monday take 1 1/2 tablet     No current facility-administered medications on file prior to visit.    Allergies:   Allergies  Allergen Reactions   Sulfa Antibiotics Other (See Comments)    Granulocytosis   Sulfamethoxazole-Trimethoprim     Other Reaction(s): Unknown   Zestril [Lisinopril] Cough      OBJECTIVE:  Physical Exam  There were no vitals filed for this visit.  There is no height or weight on file to calculate BMI. No results found.   General: well developed, well nourished, very pleasant elderly Caucasian male, seated, in no evident distress Head: head normocephalic and atraumatic.   Neck: supple with no carotid or supraclavicular bruits Cardiovascular: regular rate and rhythm, no murmurs Musculoskeletal: no deformity Skin:  no rash/petichiae Vascular:  Normal pulses all  extremities   Neurologic Exam Mental Status: Awake and fully alert.  Fluent speech and language.  Oriented to place and time. Recent and remote memory intact. Attention span, concentration and fund of knowledge appropriate. Mood and affect appropriate.  Cranial Nerves: Pupils equal, briskly reactive to light. Extraocular movements full without nystagmus. Visual fields full to confrontation. Hearing intact. Facial sensation intact. Face, tongue, palate moves normally and symmetrically.  Motor: Normal bulk and tone. Normal strength in all tested extremity muscles Sensory.:  Decreased light touch, vibratory and pinprick sensation bilateral lower extremities from ankle down Coordination: Rapid alternating movements normal in all extremities. Finger-to-nose and heel-to-shin performed accurately bilaterally. Gait and Station: Arises from chair without difficulty. Stance is normal. Gait demonstrates initially mild imbalance/unsteadiness but improved after a few steps. No AD.  Difficulty performing tandem walk and heel toe unsupported.   Reflexes: 1+ and symmetric. Toes downgoing.         ASSESSMENT: Creede Coniglio. is a 87 y.o. year old male with likely incidental finding of small left frontal lobe infarct on 05/21/2021 after presenting with generalized weakness likely in setting of sepsis with otitis externa and dental abscess.  Vascular risk factors include R MCA stroke, HTN, HLD, A-fib s/p ablation on warfarin, CAD, s/p aortic valve repair 07/2019 2/2 aortic insufficiency, CHF, mitral valve regurgitation and prediabetes.      PLAN:  Left frontal lobe infarct:  Continue warfarin daily  and Crestor 10 mg daily for secondary stroke prevention managed/prescribed by PCP/cardiology  Discussed secondary stroke prevention measures and importance of close PCP follow up for aggressive stroke risk factor management including BP goal<130/90, HLD with LDL goal<70 and pre-DM with A1c.<7.  Stroke labs  05/2021: A1c 5.7, LDL 77 I have gone over the  pathophysiology of stroke, warning signs and symptoms, risk factors and their management in some detail with instructions to go to the closest emergency room for symptoms of concern.  Peripheral neuropathy:  >10 yr duration, stable over the past 2 years without progression.  Does have pre-DM possibly contributing.  Prior B12 level and thyroid function WNL.   Recommend increasing gabapentin dosage to 300 mg twice daily.  Did discuss potential side effects and possible need of dosage increase in the future.  Discussed participation with normal PT as imbalance likely coming from neuropathy.  He wishes to hold off at this time but will call if interested in pursuing in the future Will hold off on further neuropathy work-up as symptoms have been stable but if symptoms should worsen down the road, may benefit from completing EMG/NCV. He verbalized understanding and agrees with plan    Follow up in 8 months or call earlier if needed   CC: PCP: Jarrett Soho, PA-C    I spent *** minutes of face-to-face and non-face-to-face time with patient.  This included previsit chart review, lab review, study review, order entry, electronic health record documentation, patient education and discussion regarding above diagnoses and treatment plan and answered all other questions to patient's satisfaction  Ihor Austin, [facility]  [facility] [address] [place], [place] [address]  Phone [phone] Fax [phone] Note: This document was prepared with digital dictation and possible smart phrase technology. Any transcriptional errors that result from this process are unintentional.

## 2023-01-09 DIAGNOSIS — R531 Weakness: Secondary | ICD-10-CM | POA: Diagnosis not present

## 2023-01-10 ENCOUNTER — Ambulatory Visit (INDEPENDENT_AMBULATORY_CARE_PROVIDER_SITE_OTHER): Payer: Medicare Other

## 2023-01-10 ENCOUNTER — Ambulatory Visit: Payer: Medicare Other | Admitting: Podiatry

## 2023-01-10 ENCOUNTER — Encounter: Payer: Self-pay | Admitting: Podiatry

## 2023-01-10 DIAGNOSIS — M778 Other enthesopathies, not elsewhere classified: Secondary | ICD-10-CM | POA: Diagnosis not present

## 2023-01-10 DIAGNOSIS — M7741 Metatarsalgia, right foot: Secondary | ICD-10-CM

## 2023-01-10 DIAGNOSIS — M7742 Metatarsalgia, left foot: Secondary | ICD-10-CM

## 2023-01-10 NOTE — Progress Notes (Signed)
 Chief Complaint  Patient presents with   Foot Pain    Bilateral, he reports he had a plate and screw put in the right foot 35 years ago, He did not specify a certain area of the pain.     HPI: 88 y.o. male presents today with complaint of intermittent forefoot pain.  Left greater than right.  States that foot will sometimes experience soreness. Worse at night. States he does walk barefoot. Does complain of left lower extremity soreness.  Past Medical History:  Diagnosis Date   Acquired dilation of ascending aorta and aortic root (HCC)    43mm aortic root and 41mm ascending aorta on echo 03/2020   Arthritis    Bladder stones    Borderline diabetes    BPH (benign prostatic hyperplasia)    CAD (coronary artery disease) 12/19/2020   Non-obstructive coronary artery disease at cath in 2019   Complication of anesthesia    problem with voiding after anesthesia,   Coronary artery disease    cardiologist-  dr lovette mar gerhart NP--- per cath 06-02-2010 non-obstructive cad pLAD 30-40%   CVA (cerebral vascular accident) (HCC) 10/24/2016   Diverticulosis of colon    Dysrhythmia    afib   Gout    Heart murmur    History of adenomatous polyp of colon    2002-- tubular adenoma   History of aortic insufficiency    severe -- s/p  AVR 08-03-2010   History of small bowel obstruction    02/ 2007 mechanical sbo s/p  surgical intervention;  partial sbo 09/ 2011 and 03-20-2011 resolved without surgical intervention   History of urinary retention    HTN (hypertension)    Other secondary pulmonary hypertension (HCC) 03/20/2022   TTE 03/19/22: EF 65-70, no RWMA, moderate LVH, normal RVSF, severe pulmonary hypertension (RVSP 60.4), severe BAE, normal structure and function of mitral valve repair (mean gradient 9), mild TR, trivial AI, normal structure and function of AVR (mean 15.4), moderate PI, aortic root and ascending aorta 40 mm, RAP 15   Peripheral neuropathy    Persistent atrial  fibrillation (HCC)    Pre-diabetes    S/P aortic valve replacement with prosthetic valve 08/03/2010   tissue valve   S/P Maze operation for atrial fibrillation 01/30/2018   Complete bilateral atrial lesion set using bipolar radiofrequency and cryothermy with clipping of LA appendage   S/P MVR (mitral valve repair) 01/30/2018   Complex valvuloplasty including artificial Gore-tex neochord placement x4 and Carbo medics Annuloflex ring annuloplasty, size 28   S/P patent foramen ovale closure 08/03/2010   at same time AVR   S/P tricuspid valve repair 01/30/2018   Using an MC3 Annuloplasty ring, size 28   Stroke (HCC)    Thrombocytopenia (HCC)    Tricuspid regurgitation     Past Surgical History:  Procedure Laterality Date   BIOPSY  09/07/2018   Procedure: BIOPSY;  Surgeon: Elicia Claw, MD;  Location: [facility];  Service: Gastroenterology;;   CARDIAC CATHETERIZATION  06-02-2010  dr jeffrie   non-obstructive cad- pLAD 30-40%/  normal LVSF/  severe AI   CARDIOVASCULAR STRESS TEST  04/12/2016   Low risk nuclear perfusion study w/ no significant reversible ischemia/  normal LV function and wall motion ,  stress ef 60%/  2mm inferior and lateral scooped ST-segment depression w/ exercise (may be repolarization abnormality), exercise capacity was moderately reduced   CATARACT EXTRACTION W/ INTRAOCULAR LENS  IMPLANT, BILATERAL  02/2010   CLIPPING  OF ATRIAL APPENDAGE N/A 01/30/2018   Procedure: CLIPPING OF LEFT ATRIAL APPENDAGE USING ATRICLIP PRO2 ;  Surgeon: Dusty Sudie DEL, MD;  Location: [facility] OR;  Service: Open Heart Surgery;  Laterality: N/A;   COLONOSCOPY WITH PROPOFOL  N/A 10/21/2018   Procedure: COLONOSCOPY WITH PROPOFOL ;  Surgeon: Elicia Claw, MD;  Location: [facility];  Service: Gastroenterology;  Laterality: N/A;   CYSTOSCOPY WITH LITHOLAPAXY N/A 06/05/2016   Procedure: CYSTOSCOPY WITH LITHOLAPAXY and fulgarization of bladder neck;  Surgeon: Watt Rush, MD;  Location: [facility];  Service: Urology;  Laterality: N/A;   ESOPHAGOGASTRODUODENOSCOPY (EGD) WITH PROPOFOL  N/A 09/07/2018   Procedure: ESOPHAGOGASTRODUODENOSCOPY (EGD) WITH PROPOFOL ;  Surgeon: Elicia Claw, MD;  Location: [facility];  Service: Gastroenterology;  Laterality: N/A;   EXPLORATORY LAPARTOMY /  CHOLECYSTECTOMY  02/28/2005   for Small  bowel obstruction (mechnical)   IR ANGIO EXTRACRAN SEL COM CAROTID INNOMINATE UNI L MOD SED  10/24/2016   IR ANGIO VERTEBRAL SEL SUBCLAVIAN INNOMINATE BILAT MOD SED  10/24/2016   IR PERCUTANEOUS ART THROMBECTOMY/INFUSION INTRACRANIAL INC DIAG ANGIO  10/24/2016   IR RADIOLOGIST EVAL & MGMT  12/05/2016   LEFT KNEE ARTHROSCOPY  2006   MAZE N/A 01/30/2018   Procedure: MAZE;  Surgeon: Dusty Sudie DEL, MD;  Location: [facility] OR;  Service: Open Heart Surgery;  Laterality: N/A;   MITRAL VALVE REPAIR N/A 01/30/2018   Procedure: MITRAL VALVE REPAIR (MVR) USING CARBOMEDICS ANNULOFLEX SIZE 28;  Surgeon: Dusty Sudie DEL, MD;  Location: [facility] OR;  Service: Open Heart Surgery;  Laterality: N/A;   POLYPECTOMY  10/21/2018   Procedure: POLYPECTOMY;  Surgeon: Elicia Claw, MD;  Location: [facility];  Service: Gastroenterology;;   RADIOLOGY WITH ANESTHESIA N/A 10/24/2016   Procedure: RADIOLOGY WITH ANESTHESIA;  Surgeon: Dolphus Carrion, MD;  Location: [facility] OR;  Service: Radiology;  Laterality: N/A;   RIGHT FOOT SURGERY     RIGHT MINIATURE ANTERIOR THORACOTOMY FOR AORTIC VALVE REPLACEMENT AND CLOSURE PATENT FORAMEN OVALE  08-03-2010  DR DUSTY Bohr Magna-ease pericardial tissue valve (25mm)   RIGHT/LEFT HEART CATH AND CORONARY ANGIOGRAPHY N/A 10/14/2017   Procedure: RIGHT/LEFT HEART CATH AND CORONARY ANGIOGRAPHY;  Surgeon: Wonda Sharper, MD;  Location: [facility] INVASIVE CV LAB;  Service: Cardiovascular;  Laterality: N/A;   TEE WITHOUT CARDIOVERSION N/A 10/14/2017   Procedure: TRANSESOPHAGEAL ECHOCARDIOGRAM (TEE);  Surgeon: Delford Maude BROCKS, MD;  Location: [facility] ENDOSCOPY;   Service: Cardiovascular;  Laterality: N/A;   TOTAL HIP ARTHROPLASTY Left 08/02/2020   Procedure: TOTAL HIP ARTHROPLASTY ANTERIOR APPROACH;  Surgeon: Ernie Cough, MD;  Location: [facility];  Service: Orthopedics;  Laterality: Left;   TOTAL KNEE ARTHROPLASTY Left 09/19/2021   Procedure: TOTAL KNEE ARTHROPLASTY;  Surgeon: Ernie Cough, MD;  Location: [facility];  Service: Orthopedics;  Laterality: Left;   TOTAL KNEE ARTHROPLASTY Right 08/09/2022   Procedure: TOTAL KNEE ARTHROPLASTY;  Surgeon: Ernie Cough, MD;  Location: [facility];  Service: Orthopedics;  Laterality: Right;  90   TRANSTHORACIC ECHOCARDIOGRAM  05/30/2016  dr skains   moderate  LVH ef 60-65%/  bioprothesis aortic valve present ,normal grandient and no AI /  mild MV calcification , moderate MR /  mild PR/ moderate TR/  PASP 73mmHg/ (RA denisty was identified 04-27-2016 echo) and is seen again today, this is likely a promient eustacian ridge, atrium is normal size   TRICUSPID VALVE REPLACEMENT N/A 01/30/2018   Procedure: TRICUSPID VALVE REPAIR USING MC3 SIZE 28;  Surgeon: Dusty Sudie DEL, MD;  Location: [facility] OR;  Service: Open Heart  Surgery;  Laterality: N/A;    Allergies  Allergen Reactions   Sulfa Antibiotics Other (See Comments)    Granulocytosis   Sulfamethoxazole-Trimethoprim     Other Reaction(s): Unknown   Zestril  [Lisinopril ] Cough    ROS patient denies any nausea, vomiting, fever, chills, chest pain, shortness of breath   Physical Exam: There were no vitals filed for this visit.  General: The patient is alert and oriented x3 in no acute distress.  Dermatology: Skin is warm, dry and supple bilateral lower extremities. Interspaces are clear of maceration and debris.  Fat pad atrophy bilaterally forefoot.  Vascular: Palpable pedal pulses bilaterally. Capillary refill within normal limits.  +1 to +2 pitting edema left lower extremity, + 1 pitting edema right lower extremity.  No erythema or calor.  Neurological: Light touch  sensation grossly intact bilateral feet.  Negative Mulder's click.  Negative pain with medial to lateral compression of metatarsal heads.  Musculoskeletal Exam: Decreased plantar fat pad.  Strength 5/5 for all major muscle groups. Tenderness on palpation of left forefoot dorsally proximal to metatarsal heads.  Radiographic Exam: Left and right foot radiographs 01/10/2023 Normal osseous mineralization. Joint spaces preserved.  No fractures noted. Cavus foot type. Surgical hardware to right 5th met.  Assessment/Plan of Care: 1. Metatarsalgia of both feet   2. Capsulitis of foot      No orders of the defined types were placed in this encounter.  None  Discussed clinical findings with patient today.  # Metatarsalgia secondary to fat pad atrophy, cavus foot type -Advised avoiding barefoot ambulation -Metatarsal pads x 2 applied - Recommend use of home compression stockings for lower extremity edema - Discussed importance of good supportive shoe gear. -Oral medication deferred at this time, as patient states pain is occasional and intermittent.   Tayjah Lobdell L. Lamount MAUL, AACFAS [facility]     [address] [place] [address]                Office [phone]  Fax [phone]   B/l forefoot pain, left worse than right no aggravating factor, chronic  Also left foot and leg swelling

## 2023-01-10 NOTE — Patient Instructions (Signed)
 Recommend use of metatarsal pads to offload your forefoot  More silicone and felt pads can be purchased from:  [email or web address]  I also recommend us  of a shoe that has accomodative cushioning to the forefoot, and using a shoe at home to avoid barefoot ambulation.  [employer] does a good job fitting you appropriately and making good shoe recommendations. Things like Sketchers or SAS shoes may be good options for you.  I also recommend the use of compression stockings for your foot swelling.

## 2023-01-16 ENCOUNTER — Other Ambulatory Visit: Payer: Self-pay | Admitting: Cardiology

## 2023-01-16 DIAGNOSIS — I4891 Unspecified atrial fibrillation: Secondary | ICD-10-CM

## 2023-01-16 NOTE — Telephone Encounter (Signed)
 Prescription refill request received for warfarin Lov: 09/04/22 Alben Spittle)  Next INR check: 01/1023 Warfarin tablet strength: 5mg   Appropriate dose. Refill sent.

## 2023-01-18 ENCOUNTER — Ambulatory Visit: Payer: Medicare Other

## 2023-01-24 ENCOUNTER — Ambulatory Visit: Payer: Medicare Other | Attending: Internal Medicine

## 2023-01-24 DIAGNOSIS — Z5181 Encounter for therapeutic drug level monitoring: Secondary | ICD-10-CM

## 2023-01-24 DIAGNOSIS — I4891 Unspecified atrial fibrillation: Secondary | ICD-10-CM

## 2023-01-24 DIAGNOSIS — I639 Cerebral infarction, unspecified: Secondary | ICD-10-CM | POA: Diagnosis not present

## 2023-01-24 LAB — POCT INR: INR: 2.5 (ref 2.0–3.0)

## 2023-01-24 NOTE — Patient Instructions (Signed)
Description   Continue taking warfarin 1 tablet daily except for 1.5 tablets on Mondays.   Stay consistent with greens each week.  Recheck INR in 8 WEEKS [facility] [phone]

## 2023-01-26 DIAGNOSIS — R58 Hemorrhage, not elsewhere classified: Secondary | ICD-10-CM | POA: Diagnosis not present

## 2023-02-02 ENCOUNTER — Encounter (HOSPITAL_COMMUNITY): Payer: Self-pay | Admitting: Internal Medicine

## 2023-02-02 ENCOUNTER — Emergency Department (HOSPITAL_COMMUNITY): Payer: Medicare Other

## 2023-02-02 ENCOUNTER — Other Ambulatory Visit: Payer: Self-pay

## 2023-02-02 ENCOUNTER — Inpatient Hospital Stay (HOSPITAL_COMMUNITY)
Admission: EM | Admit: 2023-02-02 | Discharge: 2023-02-22 | DRG: 853 | Disposition: A | Discharge status: Skilled Nursing Facility | Payer: Medicare Other | Attending: Internal Medicine | Admitting: Internal Medicine

## 2023-02-02 DIAGNOSIS — K567 Ileus, unspecified: Secondary | ICD-10-CM | POA: Diagnosis not present

## 2023-02-02 DIAGNOSIS — J449 Chronic obstructive pulmonary disease, unspecified: Secondary | ICD-10-CM | POA: Diagnosis present

## 2023-02-02 DIAGNOSIS — Z515 Encounter for palliative care: Secondary | ICD-10-CM | POA: Diagnosis not present

## 2023-02-02 DIAGNOSIS — Z1152 Encounter for screening for COVID-19: Secondary | ICD-10-CM

## 2023-02-02 DIAGNOSIS — E8770 Fluid overload, unspecified: Secondary | ICD-10-CM

## 2023-02-02 DIAGNOSIS — N281 Cyst of kidney, acquired: Secondary | ICD-10-CM | POA: Diagnosis not present

## 2023-02-02 DIAGNOSIS — Z96642 Presence of left artificial hip joint: Secondary | ICD-10-CM | POA: Diagnosis present

## 2023-02-02 DIAGNOSIS — Z952 Presence of prosthetic heart valve: Secondary | ICD-10-CM

## 2023-02-02 DIAGNOSIS — D649 Anemia, unspecified: Secondary | ICD-10-CM | POA: Diagnosis not present

## 2023-02-02 DIAGNOSIS — N200 Calculus of kidney: Secondary | ICD-10-CM | POA: Diagnosis not present

## 2023-02-02 DIAGNOSIS — N132 Hydronephrosis with renal and ureteral calculous obstruction: Secondary | ICD-10-CM | POA: Diagnosis not present

## 2023-02-02 DIAGNOSIS — R6521 Severe sepsis with septic shock: Secondary | ICD-10-CM | POA: Diagnosis not present

## 2023-02-02 DIAGNOSIS — R29810 Facial weakness: Secondary | ICD-10-CM | POA: Diagnosis not present

## 2023-02-02 DIAGNOSIS — Z8719 Personal history of other diseases of the digestive system: Secondary | ICD-10-CM

## 2023-02-02 DIAGNOSIS — Z8774 Personal history of (corrected) congenital malformations of heart and circulatory system: Secondary | ICD-10-CM

## 2023-02-02 DIAGNOSIS — Z882 Allergy status to sulfonamides status: Secondary | ICD-10-CM

## 2023-02-02 DIAGNOSIS — Z860101 Personal history of adenomatous and serrated colon polyps: Secondary | ICD-10-CM

## 2023-02-02 DIAGNOSIS — G9341 Metabolic encephalopathy: Secondary | ICD-10-CM | POA: Diagnosis not present

## 2023-02-02 DIAGNOSIS — S40022A Contusion of left upper arm, initial encounter: Secondary | ICD-10-CM | POA: Diagnosis not present

## 2023-02-02 DIAGNOSIS — Z888 Allergy status to other drugs, medicaments and biological substances status: Secondary | ICD-10-CM

## 2023-02-02 DIAGNOSIS — I2721 Secondary pulmonary arterial hypertension: Secondary | ICD-10-CM | POA: Diagnosis not present

## 2023-02-02 DIAGNOSIS — Z8249 Family history of ischemic heart disease and other diseases of the circulatory system: Secondary | ICD-10-CM

## 2023-02-02 DIAGNOSIS — I4891 Unspecified atrial fibrillation: Secondary | ICD-10-CM | POA: Diagnosis not present

## 2023-02-02 DIAGNOSIS — R0902 Hypoxemia: Secondary | ICD-10-CM | POA: Diagnosis not present

## 2023-02-02 DIAGNOSIS — I4821 Permanent atrial fibrillation: Secondary | ICD-10-CM | POA: Diagnosis present

## 2023-02-02 DIAGNOSIS — E875 Hyperkalemia: Secondary | ICD-10-CM | POA: Diagnosis not present

## 2023-02-02 DIAGNOSIS — I2510 Atherosclerotic heart disease of native coronary artery without angina pectoris: Secondary | ICD-10-CM | POA: Diagnosis not present

## 2023-02-02 DIAGNOSIS — J9601 Acute respiratory failure with hypoxia: Principal | ICD-10-CM | POA: Diagnosis present

## 2023-02-02 DIAGNOSIS — J9811 Atelectasis: Secondary | ICD-10-CM | POA: Diagnosis present

## 2023-02-02 DIAGNOSIS — Z8673 Personal history of transient ischemic attack (TIA), and cerebral infarction without residual deficits: Secondary | ICD-10-CM

## 2023-02-02 DIAGNOSIS — N136 Pyonephrosis: Secondary | ICD-10-CM | POA: Diagnosis not present

## 2023-02-02 DIAGNOSIS — R0602 Shortness of breath: Secondary | ICD-10-CM | POA: Diagnosis not present

## 2023-02-02 DIAGNOSIS — I10 Essential (primary) hypertension: Secondary | ICD-10-CM | POA: Diagnosis not present

## 2023-02-02 DIAGNOSIS — G8194 Hemiplegia, unspecified affecting left nondominant side: Secondary | ICD-10-CM | POA: Diagnosis present

## 2023-02-02 DIAGNOSIS — I63412 Cerebral infarction due to embolism of left middle cerebral artery: Secondary | ICD-10-CM | POA: Diagnosis not present

## 2023-02-02 DIAGNOSIS — I2489 Other forms of acute ischemic heart disease: Secondary | ICD-10-CM | POA: Diagnosis present

## 2023-02-02 DIAGNOSIS — E46 Unspecified protein-calorie malnutrition: Secondary | ICD-10-CM | POA: Diagnosis not present

## 2023-02-02 DIAGNOSIS — E872 Acidosis, unspecified: Secondary | ICD-10-CM | POA: Diagnosis not present

## 2023-02-02 DIAGNOSIS — D696 Thrombocytopenia, unspecified: Secondary | ICD-10-CM | POA: Diagnosis not present

## 2023-02-02 DIAGNOSIS — Z9889 Other specified postprocedural states: Secondary | ICD-10-CM

## 2023-02-02 DIAGNOSIS — I4892 Unspecified atrial flutter: Secondary | ICD-10-CM | POA: Diagnosis not present

## 2023-02-02 DIAGNOSIS — R739 Hyperglycemia, unspecified: Secondary | ICD-10-CM | POA: Diagnosis not present

## 2023-02-02 DIAGNOSIS — I493 Ventricular premature depolarization: Secondary | ICD-10-CM | POA: Diagnosis not present

## 2023-02-02 DIAGNOSIS — I499 Cardiac arrhythmia, unspecified: Secondary | ICD-10-CM | POA: Diagnosis not present

## 2023-02-02 DIAGNOSIS — N179 Acute kidney failure, unspecified: Secondary | ICD-10-CM | POA: Diagnosis not present

## 2023-02-02 DIAGNOSIS — Z66 Do not resuscitate: Secondary | ICD-10-CM | POA: Diagnosis not present

## 2023-02-02 DIAGNOSIS — Z808 Family history of malignant neoplasm of other organs or systems: Secondary | ICD-10-CM

## 2023-02-02 DIAGNOSIS — A419 Sepsis, unspecified organism: Secondary | ICD-10-CM | POA: Diagnosis not present

## 2023-02-02 DIAGNOSIS — E785 Hyperlipidemia, unspecified: Secondary | ICD-10-CM | POA: Diagnosis present

## 2023-02-02 DIAGNOSIS — B952 Enterococcus as the cause of diseases classified elsewhere: Secondary | ICD-10-CM | POA: Diagnosis present

## 2023-02-02 DIAGNOSIS — N201 Calculus of ureter: Secondary | ICD-10-CM

## 2023-02-02 DIAGNOSIS — Z7901 Long term (current) use of anticoagulants: Secondary | ICD-10-CM

## 2023-02-02 DIAGNOSIS — I5033 Acute on chronic diastolic (congestive) heart failure: Secondary | ICD-10-CM | POA: Diagnosis not present

## 2023-02-02 DIAGNOSIS — E876 Hypokalemia: Secondary | ICD-10-CM | POA: Diagnosis not present

## 2023-02-02 DIAGNOSIS — E861 Hypovolemia: Secondary | ICD-10-CM | POA: Diagnosis not present

## 2023-02-02 DIAGNOSIS — E440 Moderate protein-calorie malnutrition: Secondary | ICD-10-CM | POA: Diagnosis not present

## 2023-02-02 DIAGNOSIS — I110 Hypertensive heart disease with heart failure: Secondary | ICD-10-CM | POA: Diagnosis present

## 2023-02-02 DIAGNOSIS — I2729 Other secondary pulmonary hypertension: Secondary | ICD-10-CM | POA: Diagnosis present

## 2023-02-02 DIAGNOSIS — Z87442 Personal history of urinary calculi: Secondary | ICD-10-CM

## 2023-02-02 DIAGNOSIS — R64 Cachexia: Secondary | ICD-10-CM | POA: Diagnosis present

## 2023-02-02 DIAGNOSIS — D638 Anemia in other chronic diseases classified elsewhere: Secondary | ICD-10-CM | POA: Diagnosis not present

## 2023-02-02 DIAGNOSIS — Z9049 Acquired absence of other specified parts of digestive tract: Secondary | ICD-10-CM

## 2023-02-02 DIAGNOSIS — E119 Type 2 diabetes mellitus without complications: Secondary | ICD-10-CM | POA: Diagnosis not present

## 2023-02-02 DIAGNOSIS — Z79899 Other long term (current) drug therapy: Secondary | ICD-10-CM

## 2023-02-02 DIAGNOSIS — D6859 Other primary thrombophilia: Secondary | ICD-10-CM | POA: Diagnosis present

## 2023-02-02 DIAGNOSIS — Z9842 Cataract extraction status, left eye: Secondary | ICD-10-CM

## 2023-02-02 DIAGNOSIS — D62 Acute posthemorrhagic anemia: Secondary | ICD-10-CM | POA: Diagnosis not present

## 2023-02-02 DIAGNOSIS — Z7401 Bed confinement status: Secondary | ICD-10-CM

## 2023-02-02 DIAGNOSIS — I639 Cerebral infarction, unspecified: Secondary | ICD-10-CM | POA: Diagnosis not present

## 2023-02-02 DIAGNOSIS — R069 Unspecified abnormalities of breathing: Secondary | ICD-10-CM | POA: Diagnosis not present

## 2023-02-02 DIAGNOSIS — R404 Transient alteration of awareness: Secondary | ICD-10-CM | POA: Diagnosis not present

## 2023-02-02 DIAGNOSIS — Z8616 Personal history of COVID-19: Secondary | ICD-10-CM

## 2023-02-02 DIAGNOSIS — Z85828 Personal history of other malignant neoplasm of skin: Secondary | ICD-10-CM

## 2023-02-02 DIAGNOSIS — N401 Enlarged prostate with lower urinary tract symptoms: Secondary | ICD-10-CM | POA: Diagnosis present

## 2023-02-02 DIAGNOSIS — D500 Iron deficiency anemia secondary to blood loss (chronic): Secondary | ICD-10-CM | POA: Diagnosis present

## 2023-02-02 DIAGNOSIS — A4181 Sepsis due to Enterococcus: Secondary | ICD-10-CM | POA: Diagnosis not present

## 2023-02-02 DIAGNOSIS — G629 Polyneuropathy, unspecified: Secondary | ICD-10-CM | POA: Diagnosis present

## 2023-02-02 DIAGNOSIS — Z953 Presence of xenogenic heart valve: Secondary | ICD-10-CM | POA: Diagnosis not present

## 2023-02-02 DIAGNOSIS — Z8679 Personal history of other diseases of the circulatory system: Secondary | ICD-10-CM | POA: Diagnosis not present

## 2023-02-02 DIAGNOSIS — M109 Gout, unspecified: Secondary | ICD-10-CM | POA: Diagnosis present

## 2023-02-02 DIAGNOSIS — A4159 Other Gram-negative sepsis: Secondary | ICD-10-CM | POA: Diagnosis not present

## 2023-02-02 DIAGNOSIS — Z9841 Cataract extraction status, right eye: Secondary | ICD-10-CM

## 2023-02-02 DIAGNOSIS — J690 Pneumonitis due to inhalation of food and vomit: Secondary | ICD-10-CM | POA: Diagnosis not present

## 2023-02-02 DIAGNOSIS — R54 Age-related physical debility: Secondary | ICD-10-CM | POA: Diagnosis present

## 2023-02-02 DIAGNOSIS — R7989 Other specified abnormal findings of blood chemistry: Secondary | ICD-10-CM | POA: Diagnosis present

## 2023-02-02 DIAGNOSIS — R7303 Prediabetes: Secondary | ICD-10-CM | POA: Diagnosis present

## 2023-02-02 DIAGNOSIS — Z96653 Presence of artificial knee joint, bilateral: Secondary | ICD-10-CM | POA: Diagnosis present

## 2023-02-02 DIAGNOSIS — Z961 Presence of intraocular lens: Secondary | ICD-10-CM | POA: Diagnosis present

## 2023-02-02 DIAGNOSIS — R918 Other nonspecific abnormal finding of lung field: Secondary | ICD-10-CM | POA: Diagnosis not present

## 2023-02-02 DIAGNOSIS — E870 Hyperosmolality and hypernatremia: Secondary | ICD-10-CM | POA: Diagnosis not present

## 2023-02-02 DIAGNOSIS — I5082 Biventricular heart failure: Secondary | ICD-10-CM | POA: Diagnosis present

## 2023-02-02 DIAGNOSIS — I69391 Dysphagia following cerebral infarction: Secondary | ICD-10-CM

## 2023-02-02 DIAGNOSIS — R7881 Bacteremia: Secondary | ICD-10-CM | POA: Diagnosis not present

## 2023-02-02 HISTORY — DX: Acute respiratory failure with hypoxia: J96.01

## 2023-02-02 HISTORY — DX: Fluid overload, unspecified: E87.70

## 2023-02-02 LAB — CBC WITH DIFFERENTIAL/PLATELET
Abs Immature Granulocytes: 0.06 10*3/uL (ref 0.00–0.07)
Basophils Absolute: 0 10*3/uL (ref 0.0–0.1)
Basophils Relative: 0 %
Eosinophils Absolute: 0 10*3/uL (ref 0.0–0.5)
Eosinophils Relative: 0 %
HCT: 32.5 % — ABNORMAL LOW (ref 39.0–52.0)
Hemoglobin: 10.3 g/dL — ABNORMAL LOW (ref 13.0–17.0)
Immature Granulocytes: 1 %
Lymphocytes Relative: 4 %
Lymphs Abs: 0.3 10*3/uL — ABNORMAL LOW (ref 0.7–4.0)
MCH: 30.2 pg (ref 26.0–34.0)
MCHC: 31.7 g/dL (ref 30.0–36.0)
MCV: 95.3 fL (ref 80.0–100.0)
Monocytes Absolute: 0.1 10*3/uL (ref 0.1–1.0)
Monocytes Relative: 1 %
Neutro Abs: 8.1 10*3/uL — ABNORMAL HIGH (ref 1.7–7.7)
Neutrophils Relative %: 94 %
Platelets: 128 10*3/uL — ABNORMAL LOW (ref 150–400)
RBC: 3.41 MIL/uL — ABNORMAL LOW (ref 4.22–5.81)
RDW: 15.9 % — ABNORMAL HIGH (ref 11.5–15.5)
WBC: 8.6 10*3/uL (ref 4.0–10.5)
nRBC: 0 % (ref 0.0–0.2)

## 2023-02-02 LAB — COMPREHENSIVE METABOLIC PANEL
ALT: 16 U/L (ref 0–44)
AST: 29 U/L (ref 15–41)
Albumin: 3.6 g/dL (ref 3.5–5.0)
Alkaline Phosphatase: 69 U/L (ref 38–126)
Anion gap: 14 (ref 5–15)
BUN: 22 mg/dL (ref 8–23)
CO2: 19 mmol/L — ABNORMAL LOW (ref 22–32)
Calcium: 8.8 mg/dL — ABNORMAL LOW (ref 8.9–10.3)
Chloride: 110 mmol/L (ref 98–111)
Creatinine, Ser: 1.19 mg/dL (ref 0.61–1.24)
GFR, Estimated: 60 mL/min — ABNORMAL LOW (ref >60)
Glucose, Bld: 114 mg/dL — ABNORMAL HIGH (ref 70–99)
Potassium: 3.4 mmol/L — ABNORMAL LOW (ref 3.5–5.1)
Sodium: 143 mmol/L (ref 135–145)
Total Bilirubin: 1.3 mg/dL — ABNORMAL HIGH (ref 0.0–1.2)
Total Protein: 7.1 g/dL (ref 6.5–8.1)

## 2023-02-02 LAB — TROPONIN I (HIGH SENSITIVITY)
Troponin I (High Sensitivity): 34 ng/L — ABNORMAL HIGH (ref <18)
Troponin I (High Sensitivity): 59 ng/L — ABNORMAL HIGH (ref <18)

## 2023-02-02 LAB — PROTIME-INR
INR: 2.6 — ABNORMAL HIGH (ref 0.8–1.2)
Prothrombin Time: 27.7 s — ABNORMAL HIGH (ref 11.4–15.2)

## 2023-02-02 LAB — BRAIN NATRIURETIC PEPTIDE: B Natriuretic Peptide: 432.5 pg/mL — ABNORMAL HIGH (ref 0.0–100.0)

## 2023-02-02 LAB — RESP PANEL BY RT-PCR (RSV, FLU A&B, COVID)  RVPGX2
Influenza A by PCR: NEGATIVE
Influenza B by PCR: NEGATIVE
Resp Syncytial Virus by PCR: NEGATIVE
SARS Coronavirus 2 by RT PCR: NEGATIVE

## 2023-02-02 MED ORDER — WARFARIN SODIUM 5 MG PO TABS
5.0000 mg | ORAL_TABLET | ORAL | Status: DC
  Administered 2023-02-02: 5 mg via ORAL
  Filled 2023-02-02: qty 1

## 2023-02-02 MED ORDER — FINASTERIDE 5 MG PO TABS
5.0000 mg | ORAL_TABLET | Freq: Every day | ORAL | Status: DC
  Administered 2023-02-02 – 2023-02-03 (×2): 5 mg via ORAL
  Filled 2023-02-02 (×2): qty 1

## 2023-02-02 MED ORDER — ALLOPURINOL 100 MG PO TABS
150.0000 mg | ORAL_TABLET | Freq: Every morning | ORAL | Status: DC
  Administered 2023-02-03: 150 mg via ORAL
  Filled 2023-02-02: qty 1

## 2023-02-02 MED ORDER — ACETAMINOPHEN 650 MG RE SUPP
650.0000 mg | Freq: Four times a day (QID) | RECTAL | Status: DC | PRN
  Administered 2023-02-03 – 2023-02-05 (×2): 650 mg via RECTAL
  Filled 2023-02-02 (×2): qty 1

## 2023-02-02 MED ORDER — ATENOLOL 25 MG PO TABS
12.5000 mg | ORAL_TABLET | Freq: Every day | ORAL | Status: DC
  Administered 2023-02-03: 12.5 mg via ORAL
  Filled 2023-02-02: qty 1
  Filled 2023-02-02: qty 0.5

## 2023-02-02 MED ORDER — POTASSIUM CHLORIDE CRYS ER 20 MEQ PO TBCR
40.0000 meq | EXTENDED_RELEASE_TABLET | Freq: Two times a day (BID) | ORAL | Status: DC
  Administered 2023-02-03: 40 meq via ORAL
  Filled 2023-02-02: qty 2

## 2023-02-02 MED ORDER — ACETAMINOPHEN 325 MG PO TABS
650.0000 mg | ORAL_TABLET | Freq: Four times a day (QID) | ORAL | Status: DC | PRN
Start: 2023-02-02 — End: 2023-02-22
  Administered 2023-02-08: 650 mg via ORAL
  Filled 2023-02-02: qty 2

## 2023-02-02 MED ORDER — FERROUS SULFATE 325 (65 FE) MG PO TABS
325.0000 mg | ORAL_TABLET | Freq: Two times a day (BID) | ORAL | Status: DC
  Administered 2023-02-03: 325 mg via ORAL
  Filled 2023-02-02: qty 1

## 2023-02-02 MED ORDER — WARFARIN - PHARMACIST DOSING INPATIENT: Freq: Every day | Status: DC

## 2023-02-02 MED ORDER — TAMSULOSIN HCL 0.4 MG PO CAPS
0.4000 mg | ORAL_CAPSULE | Freq: Every day | ORAL | Status: DC
Start: 2023-02-02 — End: 2023-02-03
  Administered 2023-02-02: 0.4 mg via ORAL
  Filled 2023-02-02: qty 1

## 2023-02-02 MED ORDER — POTASSIUM CHLORIDE CRYS ER 20 MEQ PO TBCR
40.0000 meq | EXTENDED_RELEASE_TABLET | ORAL | Status: AC
  Administered 2023-02-02 (×2): 40 meq via ORAL
  Filled 2023-02-02 (×2): qty 2

## 2023-02-02 MED ORDER — FUROSEMIDE 10 MG/ML IJ SOLN
40.0000 mg | Freq: Once | INTRAMUSCULAR | Status: AC
  Administered 2023-02-02: 40 mg via INTRAVENOUS
  Filled 2023-02-02: qty 4

## 2023-02-02 MED ORDER — ROSUVASTATIN CALCIUM 10 MG PO TABS
10.0000 mg | ORAL_TABLET | Freq: Every day | ORAL | Status: DC
  Administered 2023-02-02 – 2023-02-03 (×2): 10 mg via ORAL
  Filled 2023-02-02 (×2): qty 1

## 2023-02-02 MED ORDER — ONDANSETRON HCL 4 MG PO TABS: 4.0000 mg | ORAL_TABLET | Freq: Four times a day (QID) | ORAL | Status: DC | PRN

## 2023-02-02 MED ORDER — WARFARIN SODIUM 5 MG PO TABS: 7.5000 mg | ORAL_TABLET | ORAL | Status: DC

## 2023-02-02 MED ORDER — ONDANSETRON HCL 4 MG/2ML IJ SOLN
4.0000 mg | Freq: Four times a day (QID) | INTRAMUSCULAR | Status: DC | PRN
  Administered 2023-02-03 (×2): 4 mg via INTRAVENOUS
  Filled 2023-02-02 (×2): qty 2

## 2023-02-02 MED ORDER — FUROSEMIDE 10 MG/ML IJ SOLN
40.0000 mg | Freq: Two times a day (BID) | INTRAMUSCULAR | Status: DC
  Administered 2023-02-02 – 2023-02-03 (×2): 40 mg via INTRAVENOUS
  Filled 2023-02-02 (×3): qty 4

## 2023-02-02 MED ORDER — TRAZODONE HCL 50 MG PO TABS
25.0000 mg | ORAL_TABLET | Freq: Every evening | ORAL | Status: DC | PRN
  Administered 2023-02-03 – 2023-02-06 (×2): 25 mg via ORAL
  Filled 2023-02-02 (×2): qty 1

## 2023-02-02 NOTE — Consult Note (Signed)
Cardiology Consultation   Patient ID: [name]. MRN: [id number]; DOB: 08-03-1935  Admit date: 02/02/2023 Date of Consult: 02/02/2023  PCP:  Jarrett Soho, PA-C   [facility] Providers Cardiologist:  Donato Schultz, MD  Cardiology APP:  Beatrice Lecher, PA-C       Patient Profile:   [name]. is a 88 y.o. male with a hx of aortic regurgitation status post bioprosthetic AVR, PFO closure in 2012, severe mitral regurgitation status post mitral valve repair, severe tricuspid regurgitation with repair of the tricuspid valve, status post male maze LAA clipping in 2020, pulmonary hypertension with pulmonary artery systolic pressure 60 in March 2024, this has been suspected to be in the setting of COPD, hypertension, paroxysmal atrial flutter/atrial fibrillation chronically on warfarin, coronary artery disease nonobstructive, carotid artery stenosis, aortic atherosclerosis, dilated thoracic aortic aneurysm last echo in March 2024 measured at 44 mm who is being seen 02/02/2023 for the evaluation of  at the request of Dr. Robb Matar.  History of Present Illness:   [name] presented to the emergency department due to worsening shortness of breath.  He tells me that last night he woke up and really could not breathe even though this has been progressive but this was the worst.  He was nauseous and he was concerned.  Therefore EMS was called and he was brought into the emergency department to be evaluated.  On presentation to the ED he was hypoxic he was on 2 L nasal cannula.  He was also taken Neck with noted volume overload.  At this time cardiology has been asked to see the patient due to fluid overload.  He was seen and examined by his bedside in the emergency department  Of note he was seen in our office in September 2024 by Tereso Newcomer, PA at that time he was experiencing significant lower extremity edema discussion for starting Lasix as needed for the patient  however the patient declined.   Past Medical History:  Diagnosis Date   Acquired dilation of ascending aorta and aortic root (HCC)    43mm aortic root and 41mm ascending aorta on echo 03/2020   Arthritis    Bladder stones    Borderline diabetes    BPH (benign prostatic hyperplasia)    CAD (coronary artery disease) 12/19/2020   Non-obstructive coronary artery disease at cath in 2019   Complication of anesthesia    problem with voiding after anesthesia,   Coronary artery disease    cardiologist-  dr Rockey Situ gerhart NP--- per cath 06-02-2010 non-obstructive cad pLAD 30-40%   CVA (cerebral vascular accident) (HCC) 10/24/2016   Diverticulosis of colon    Dysrhythmia    afib   Gout    Heart murmur    History of adenomatous polyp of colon    2002-- tubular adenoma   History of aortic insufficiency    severe -- s/p  AVR 08-03-2010   History of small bowel obstruction    02/ 2007 mechanical sbo s/p  surgical intervention;  partial sbo 09/ 2011 and 03-20-2011 resolved without surgical intervention   History of urinary retention    HTN (hypertension)    Other secondary pulmonary hypertension (HCC) 03/20/2022   TTE 03/19/22: EF 65-70, no RWMA, moderate LVH, normal RVSF, severe pulmonary hypertension (RVSP 60.4), severe BAE, normal structure and function of mitral valve repair (mean gradient 9), mild TR, trivial AI, normal structure and function of AVR (mean 15.4), moderate PI, aortic root and  ascending aorta 40 mm, RAP 15   Peripheral neuropathy    Persistent atrial fibrillation (HCC)    Pre-diabetes    S/P aortic valve replacement with prosthetic valve 08/03/2010   tissue valve   S/P Maze operation for atrial fibrillation 01/30/2018   Complete bilateral atrial lesion set using bipolar radiofrequency and cryothermy with clipping of LA appendage   S/P MVR (mitral valve repair) 01/30/2018   Complex valvuloplasty including artificial Gore-tex neochord placement x4 and Carbo medics  Annuloflex ring annuloplasty, size 28   S/P patent foramen ovale closure 08/03/2010   at same time AVR   S/P tricuspid valve repair 01/30/2018   Using an MC3 Annuloplasty ring, size 28   Stroke (HCC)    Thrombocytopenia (HCC)    Tricuspid regurgitation     Past Surgical History:  Procedure Laterality Date   BIOPSY  09/07/2018   Procedure: BIOPSY;  Surgeon: Kathi Der, MD;  Location: [facility];  Service: Gastroenterology;;   CARDIAC CATHETERIZATION  06-02-2010  dr Anne Fu   non-obstructive cad- pLAD 30-40%/  normal LVSF/  severe AI   CARDIOVASCULAR STRESS TEST  04/12/2016   Low risk nuclear perfusion study w/ no significant reversible ischemia/  normal LV function and wall motion ,  stress ef 60%/  2mm inferior and lateral scooped ST-segment depression w/ exercise (may be repolarization abnormality), exercise capacity was moderately reduced   CATARACT EXTRACTION W/ INTRAOCULAR LENS  IMPLANT, BILATERAL  02/2010   CLIPPING OF ATRIAL APPENDAGE N/A 01/30/2018   Procedure: CLIPPING OF LEFT ATRIAL APPENDAGE USING ATRICLIP PRO2 ;  Surgeon: Purcell Nails, MD;  Location: [facility] OR;  Service: Open Heart Surgery;  Laterality: N/A;   COLONOSCOPY WITH PROPOFOL N/A 10/21/2018   Procedure: COLONOSCOPY WITH PROPOFOL;  Surgeon: Kathi Der, MD;  Location: [facility];  Service: Gastroenterology;  Laterality: N/A;   CYSTOSCOPY WITH LITHOLAPAXY N/A 06/05/2016   Procedure: CYSTOSCOPY WITH LITHOLAPAXY and fulgarization of bladder neck;  Surgeon: Bjorn Pippin, MD;  Location: [facility];  Service: Urology;  Laterality: N/A;   ESOPHAGOGASTRODUODENOSCOPY (EGD) WITH PROPOFOL N/A 09/07/2018   Procedure: ESOPHAGOGASTRODUODENOSCOPY (EGD) WITH PROPOFOL;  Surgeon: Kathi Der, MD;  Location: [facility];  Service: Gastroenterology;  Laterality: N/A;   EXPLORATORY LAPARTOMY /  CHOLECYSTECTOMY  02/28/2005   for Small  bowel obstruction (mechnical)   IR ANGIO EXTRACRAN SEL COM CAROTID  INNOMINATE UNI L MOD SED  10/24/2016   IR ANGIO VERTEBRAL SEL SUBCLAVIAN INNOMINATE BILAT MOD SED  10/24/2016   IR PERCUTANEOUS ART THROMBECTOMY/INFUSION INTRACRANIAL INC DIAG ANGIO  10/24/2016   IR RADIOLOGIST EVAL & MGMT  12/05/2016   LEFT KNEE ARTHROSCOPY  2006   MAZE N/A 01/30/2018   Procedure: MAZE;  Surgeon: Purcell Nails, MD;  Location: [facility] OR;  Service: Open Heart Surgery;  Laterality: N/A;   MITRAL VALVE REPAIR N/A 01/30/2018   Procedure: MITRAL VALVE REPAIR (MVR) USING CARBOMEDICS ANNULOFLEX SIZE 28;  Surgeon: Purcell Nails, MD;  Location: [facility] OR;  Service: Open Heart Surgery;  Laterality: N/A;   POLYPECTOMY  10/21/2018   Procedure: POLYPECTOMY;  Surgeon: Kathi Der, MD;  Location: [facility];  Service: Gastroenterology;;   RADIOLOGY WITH ANESTHESIA N/A 10/24/2016   Procedure: RADIOLOGY WITH ANESTHESIA;  Surgeon: Julieanne Cotton, MD;  Location: [facility] OR;  Service: Radiology;  Laterality: N/A;   RIGHT FOOT SURGERY     RIGHT MINIATURE ANTERIOR THORACOTOMY FOR AORTIC VALVE REPLACEMENT AND CLOSURE PATENT FORAMEN OVALE  08-03-2010  DR Neldon Labella Magna-ease pericardial tissue valve (  25mm)   RIGHT/LEFT HEART CATH AND CORONARY ANGIOGRAPHY N/A 10/14/2017   Procedure: RIGHT/LEFT HEART CATH AND CORONARY ANGIOGRAPHY;  Surgeon: Tonny Bollman, MD;  Location: [facility] INVASIVE CV LAB;  Service: Cardiovascular;  Laterality: N/A;   TEE WITHOUT CARDIOVERSION N/A 10/14/2017   Procedure: TRANSESOPHAGEAL ECHOCARDIOGRAM (TEE);  Surgeon: Wendall Stade, MD;  Location: [facility] ENDOSCOPY;  Service: Cardiovascular;  Laterality: N/A;   TOTAL HIP ARTHROPLASTY Left 08/02/2020   Procedure: TOTAL HIP ARTHROPLASTY ANTERIOR APPROACH;  Surgeon: Durene Romans, MD;  Location: [facility];  Service: Orthopedics;  Laterality: Left;   TOTAL KNEE ARTHROPLASTY Left 09/19/2021   Procedure: TOTAL KNEE ARTHROPLASTY;  Surgeon: Durene Romans, MD;  Location: [facility];  Service: Orthopedics;  Laterality: Left;   TOTAL KNEE  ARTHROPLASTY Right 08/09/2022   Procedure: TOTAL KNEE ARTHROPLASTY;  Surgeon: Durene Romans, MD;  Location: [facility];  Service: Orthopedics;  Laterality: Right;  90   TRANSTHORACIC ECHOCARDIOGRAM  05/30/2016  dr skains   moderate  LVH ef 60-65%/  bioprothesis aortic valve present ,normal grandient and no AI /  mild MV calcification , moderate MR /  mild PR/ moderate TR/  PASP 6mmHg/ (RA denisty was identified 04-27-2016 echo) and is seen again today, this is likely a promient eustacian ridge, atrium is normal size   TRICUSPID VALVE REPLACEMENT N/A 01/30/2018   Procedure: TRICUSPID VALVE REPAIR USING MC3 SIZE 28;  Surgeon: Purcell Nails, MD;  Location: [facility] OR;  Service: Open Heart Surgery;  Laterality: N/A;     Home Medications:  Prior to Admission medications   Medication Sig Start Date End Date Taking? Authorizing Provider  allopurinol (ZYLOPRIM) 300 MG tablet Take 150 mg by mouth every morning.   Yes [provider]  amLODipine (NORVASC) 10 MG tablet TAKE 1 TABLET BY MOUTH DAILY 07/17/22  Yes Jake Bathe, MD  atenolol (TENORMIN) 25 MG tablet TAKE ONE-HALF TABLET BY MOUTH  DAILY 09/20/22  Yes Jake Bathe, MD  cholecalciferol (VITAMIN D3) 25 MCG (1000 UNIT) tablet Take 1,000 Units by mouth daily.   Yes [provider]  Cinnamon 500 MG capsule Take 1,500 mg by mouth every morning.   Yes [provider]  ferrous sulfate 325 (65 FE) MG EC tablet Take 325 mg by mouth 2 (two) times daily. 10/29/19  Yes [provider]  finasteride (PROSCAR) 5 MG tablet Take 1 tablet (5 mg total) by mouth daily. 11/05/16  Yes Angiulli, Mcarthur Rossetti, PA-C  Multiple Vitamins-Minerals (CENTRUM SILVER ADULT 50+) TABS Take 1 tablet by mouth daily.   Yes [provider]  rosuvastatin (CRESTOR) 10 MG tablet TAKE 1 TABLET BY MOUTH DAILY 07/17/22  Yes Jake Bathe, MD  silodosin (RAPAFLO) 8 MG CAPS capsule Take 8 mg by mouth daily. 11/02/21  Yes [provider]  traZODone  (DESYREL) 50 MG tablet Take 25 mg by mouth at bedtime as needed for sleep.   Yes [provider]  warfarin (COUMADIN) 5 MG tablet TAKE 1 TO 1 AND 1/2 TABLETS BY  MOUTH DAILY OR AS DIRECTED BY  ANTICOAGULATION CLINIC Patient taking differently: Take 5-7.5 mg by mouth See admin instructions. Take 5 mg by mouth once daily on Sunday, Tuesday, Wednesday, Thursday, Friday, and Saturday. Take 7.5 mg by mouth once daily on Monday. 01/16/23  Yes Jake Bathe, MD    Inpatient Medications: Scheduled Meds:  potassium chloride  40 mEq Oral Q4H   Continuous Infusions:  PRN Meds: acetaminophen **OR** acetaminophen, ondansetron **OR** ondansetron (ZOFRAN) IV  Allergies:  Allergies  Allergen Reactions   Sulfa Antibiotics Other (See Comments)    Granulocytosis   Sulfamethoxazole-Trimethoprim     Other Reaction(s): Unknown   Zestril [Lisinopril] Cough    Social History:   Social History   Socioeconomic History   Marital status: Married    Spouse name: [name]   Number of children: 2   Years of education: Not on file   Highest education level: Not on file  Occupational History   Occupation: RETIRED  Tobacco Use   Smoking status: Never   Smokeless tobacco: Never  Vaping Use   Vaping status: Never Used  Substance and Sexual Activity   Alcohol use: Yes    Comment: ONE OR TWO PER MONTH   Drug use: No   Sexual activity: Not Currently  Other Topics Concern   Not on file  Social History Narrative   Not on file   Social Drivers of Health   Financial Resource Strain: Low Risk  (06/16/2021)   Overall Financial Resource Strain (CARDIA)    Difficulty of Paying Living Expenses: Not hard at all  Food Insecurity: No Food Insecurity (08/09/2022)   Hunger Vital Sign    Worried About Running Out of Food in the Last Year: Never true    Ran Out of Food in the Last Year: Never true  Transportation Needs: No Transportation Needs (08/09/2022)   PRAPARE - Scientist, research (physical sciences) (Medical): No    Lack of Transportation (Non-Medical): No  Physical Activity: Not on file  Stress: No Stress Concern Present (06/16/2021)   [employer] of Occupational Health - Occupational Stress Questionnaire    Feeling of Stress : Only a little  Social Connections: Unknown (06/02/2021)   Social Connection and Isolation Panel [NHANES]    Frequency of Communication with Friends and Family: Twice a week    Frequency of Social Gatherings with Friends and Family: Twice a week    Attends Religious Services: Patient declined    Database administrator or Organizations: Patient declined    Attends Banker Meetings: Patient declined    Marital Status: Married  Catering manager Violence: Not At Risk (08/09/2022)   Humiliation, Afraid, Rape, and Kick questionnaire    Fear of Current or Ex-Partner: No    Emotionally Abused: No    Physically Abused: No    Sexually Abused: No    Family History:    Family History  Problem Relation Age of Onset   Heart disease Mother    Brain cancer Father      ROS:  Please see the history of present illness.  Shortness of breath All other ROS reviewed and negative.     Physical Exam/Data:   Vitals:   02/02/23 0915 02/02/23 0930 02/02/23 0945 02/02/23 1000  BP: (!) 111/57 123/62 121/61 (!) 129/53  Pulse: 63 (!) 104 78 (!) 56  Resp: 19 (!) 23 (!) 23 (!) 24  Temp:      SpO2: 95% 92% 94% 90%  Weight:      Height:       No intake or output data in the 24 hours ending 02/02/23 1133    02/02/2023    7:58 AM 11/08/2022    8:41 AM 09/04/2022    1:46 PM  Last 3 Weights  Weight (lbs) 175 lb 14.8 oz 176 lb 170 lb 9.6 oz  Weight (kg) 79.8 kg 79.833 kg 77.384 kg     Body mass index is 23.86 kg/m.  General:  Well nourished, well developed, in no acute distress HEENT: normal Neck: no JVD Vascular: No carotid bruits; Distal pulses 2+ bilaterally Cardiac:  normal S1, S2; RRR;  Lungs:  cl bilateral crackles, no wheezing,  rhonchi or rales  Abd: soft, nontender, no hepatomegaly  Ext: History bilateral lower extremity edema Musculoskeletal:  No deformities, BUE and BLE strength normal and equal Skin: warm and dry  Neuro:  CNs 2-12 intact, no focal abnormalities noted Psych:  Normal affect   EKG:  The EKG was personally reviewed and demonstrates: Fibrillation with controlled ventricular rate Telemetry:  Telemetry was personally reviewed and demonstrates: Atrial fibrillation with controlled ventricular rate  Relevant CV Studies: Echo from March 2024  Laboratory Data:  High Sensitivity Troponin:   Recent Labs  Lab 02/02/23 0806 02/02/23 0950  TROPONINIHS 34* 59*     Chemistry Recent Labs  Lab 02/02/23 0806  NA 143  K 3.4*  CL 110  CO2 19*  GLUCOSE 114*  BUN 22  CREATININE 1.19  CALCIUM 8.8*  GFRNONAA 60*  ANIONGAP 14    Recent Labs  Lab 02/02/23 0806  PROT 7.1  ALBUMIN 3.6  AST 29  ALT 16  ALKPHOS 69  BILITOT 1.3*   Lipids No results for input(s): "CHOL", "TRIG", "HDL", "LABVLDL", "LDLCALC", "CHOLHDL" in the last 168 hours.  Hematology Recent Labs  Lab 02/02/23 0806  WBC 8.6  RBC 3.41*  HGB 10.3*  HCT 32.5*  MCV 95.3  MCH 30.2  MCHC 31.7  RDW 15.9*  PLT 128*   Thyroid No results for input(s): "TSH", "FREET4" in the last 168 hours.  BNP Recent Labs  Lab 02/02/23 0806  BNP 432.5*    DDimer No results for input(s): "DDIMER" in the last 168 hours.   Radiology/Studies:  DG Chest Portable 1 View Result Date: 02/02/2023 CLINICAL DATA:  Shortness of breath. EXAM: PORTABLE CHEST 1 VIEW COMPARISON:  02/16/2022 FINDINGS: The cardio pericardial silhouette is enlarged. Diffuse interstitial opacity suggests edema. No focal lung consolidation. No substantial pleural effusion. Bones are diffusely demineralized. Telemetry leads overlie the chest. IMPRESSION: Enlargement of the cardiopericardial silhouette with diffuse interstitial opacity suggesting edema. Electronically Signed    By: Kennith Center M.D.   On: 02/02/2023 08:21     Assessment and Plan:   Acute on chronic suspicion of heart failure with preserved ejection fraction Severe pulmonary hypertension Status post valve repair: Aortic valve, mitral valve and tricuspid valve Paroxysmal atrial fibrillation/atrial flutter-rate is controlled currently CAD no angina symptoms Elevated troponin-flat suspect this is in the setting of his exacerbation of heart failure fluid overload   Clinically he appears to be significantly fluid overloaded.  He needs diuretics.  He has been given Lasix 40 mg x 1 IV in the ED.  Will start Lasix 40 mg twice daily.  He needs strict input and output, with daily weights. Agree with the echocardiogram this has been ordered.  Will follow-up his updated echo. In atrial fibrillation but is rate controlled continue his home stable dose of atenolol. Will hold off on the amlodipine for now giving his being actively diuresed to avoid hypotension. Monitor creatinine on Lasix. Would recommend getting pharmacy on board to monitor his INR during his hospitalization.   Risk Assessment/Risk Scores:        New York Heart Association (NYHA) Functional Class NYHA Class III  CHA2DS2-VASc Score = 7   This indicates a 11.2% annual risk of stroke. The patient's score is based upon: CHF History: 1 HTN History: 1 Diabetes  History: 0 Stroke History: 2 Vascular Disease History: 1 Age Score: 2 Gender Score: 0         For questions or updates, please contact [facility] Please consult [email or web address] for contact info under    SignedThomasene Ripple, DO  02/02/2023 11:33 AM

## 2023-02-02 NOTE — H&P (Signed)
History and Physical    Patient: [name]. ZOX:[id number] DOB: 1935-05-25 DOA: 02/02/2023 DOS: the patient was seen and examined on 02/02/2023 PCP: Jarrett Soho, PA-C  Patient coming from: Home  Chief Complaint:  Chief Complaint  Patient presents with   Shortness of Breath   HPI: [name]. is a 88 y.o. male with medical history significant of acquired dilatation of the ascending aorta and aortic root, CAD, atrial fibrillation, status post maze procedure, history of CVA, hypertension, heart murmur, aortic valve replacement, status post patent foramen ovale closure, status post mitral valve repair, tricuspid regurgitation, status post tricuspid valve repair, osteoarthritis, bladder urolithiasis, prediabetes, BPH, colon diverticulosis, small bowel obstruction, peripheral neuropathy, thrombocytopenia who presented to the emergency department via EMS complaints of dyspnea that woke him up from sleep last night and was unable to breathe.  He has been having lower extremity edema for the past few weeks.  No chest pain, palpitations, nausea, emesis or diaphoresis.  No fever, chills, wheezing or hemoptysis.  No abdominal pain, diarrhea, constipation, melena, hematochezia, flank pain, dysuria, frequency or hematuria.  EMS reported that he was found with an O2 saturation 85% at home.  They gave him supplemental oxygen, methylprednisolone 125 mg IVP x 2 and 2 DuoNebs.  Lab work: CBC showed a white count of 8.6, Mono 10.3 g/dL and platelets 811.  PT 27.7 and INR 2.6.  Negative coronavirus, influenza and RSV PCR.  CMP showed a potassium of 3.4 and CO2 of 19 mmol/L with an anion gap of 14.  Glucose 114 and total bilirubin 1.3 mg/dL.  The rest of the electrolytes, hepatic and renal function were normal after calcium correction.  Troponin was 34 ng/L, second troponin level was 59 ng/L.  CMP showed a potassium of 3.4 and CO2 of 19 mmol/L with an anion gap of 14, and the rest of the  electrolytes and renal function were normal after calcium correction.  LFTs were normal with the exception of a total bilirubin level of 1.3 mg/dL.  Imaging: Portable 1 view chest radiograph showing cardiomegaly with diffuse interstitial opacities suggesting edema.  No focal lung consolidation.  No substantial pleural effusion.   ED course: Initial vital signs were temperature 98.8 F, pulse 77, respiration 30, BP 138/70 mmHg O2 sat 89% on room air.  The patient was placed on nasal cannula oxygen at 2 LPM and received furosemide 40 mg IVP x 1.  Review of Systems: As mentioned in the history of present illness. All other systems reviewed and are negative. Past Medical History:  Diagnosis Date   Acquired dilation of ascending aorta and aortic root (HCC)    43mm aortic root and 41mm ascending aorta on echo 03/2020   Arthritis    Bladder stones    Borderline diabetes    BPH (benign prostatic hyperplasia)    CAD (coronary artery disease) 12/19/2020   Non-obstructive coronary artery disease at cath in 2019   Complication of anesthesia    problem with voiding after anesthesia,   Coronary artery disease    cardiologist-  dr Rockey Situ gerhart NP--- per cath 06-02-2010 non-obstructive cad pLAD 30-40%   CVA (cerebral vascular accident) (HCC) 10/24/2016   Diverticulosis of colon    Dysrhythmia    afib   Gout    Heart murmur    History of adenomatous polyp of colon    2002-- tubular adenoma   History of aortic insufficiency    severe -- s/p  AVR 08-03-2010  History of small bowel obstruction    02/ 2007 mechanical sbo s/p  surgical intervention;  partial sbo 09/ 2011 and 03-20-2011 resolved without surgical intervention   History of urinary retention    HTN (hypertension)    Other secondary pulmonary hypertension (HCC) 03/20/2022   TTE 03/19/22: EF 65-70, no RWMA, moderate LVH, normal RVSF, severe pulmonary hypertension (RVSP 60.4), severe BAE, normal structure and function of mitral valve  repair (mean gradient 9), mild TR, trivial AI, normal structure and function of AVR (mean 15.4), moderate PI, aortic root and ascending aorta 40 mm, RAP 15   Peripheral neuropathy    Persistent atrial fibrillation (HCC)    Pre-diabetes    S/P aortic valve replacement with prosthetic valve 08/03/2010   tissue valve   S/P Maze operation for atrial fibrillation 01/30/2018   Complete bilateral atrial lesion set using bipolar radiofrequency and cryothermy with clipping of LA appendage   S/P MVR (mitral valve repair) 01/30/2018   Complex valvuloplasty including artificial Gore-tex neochord placement x4 and Carbo medics Annuloflex ring annuloplasty, size 28   S/P patent foramen ovale closure 08/03/2010   at same time AVR   S/P tricuspid valve repair 01/30/2018   Using an MC3 Annuloplasty ring, size 28   Stroke (HCC)    Thrombocytopenia (HCC)    Tricuspid regurgitation    Past Surgical History:  Procedure Laterality Date   BIOPSY  09/07/2018   Procedure: BIOPSY;  Surgeon: Kathi Der, MD;  Location: [facility];  Service: Gastroenterology;;   CARDIAC CATHETERIZATION  06-02-2010  dr Anne Fu   non-obstructive cad- pLAD 30-40%/  normal LVSF/  severe AI   CARDIOVASCULAR STRESS TEST  04/12/2016   Low risk nuclear perfusion study w/ no significant reversible ischemia/  normal LV function and wall motion ,  stress ef 60%/  2mm inferior and lateral scooped ST-segment depression w/ exercise (may be repolarization abnormality), exercise capacity was moderately reduced   CATARACT EXTRACTION W/ INTRAOCULAR LENS  IMPLANT, BILATERAL  02/2010   CLIPPING OF ATRIAL APPENDAGE N/A 01/30/2018   Procedure: CLIPPING OF LEFT ATRIAL APPENDAGE USING ATRICLIP PRO2 ;  Surgeon: Purcell Nails, MD;  Location: [facility] OR;  Service: Open Heart Surgery;  Laterality: N/A;   COLONOSCOPY WITH PROPOFOL N/A 10/21/2018   Procedure: COLONOSCOPY WITH PROPOFOL;  Surgeon: Kathi Der, MD;  Location: [facility];  Service:  Gastroenterology;  Laterality: N/A;   CYSTOSCOPY WITH LITHOLAPAXY N/A 06/05/2016   Procedure: CYSTOSCOPY WITH LITHOLAPAXY and fulgarization of bladder neck;  Surgeon: Bjorn Pippin, MD;  Location[facility];  Service: Urology;  Laterality: N/A;   ESOPHAGOGASTRODUODENOSCOPY (EGD) WITH PROPOFOL N/A 09/07/2018   Procedure: ESOPHAGOGASTRODUODENOSCOPY (EGD) WITH PROPOFOL;  Surgeon: Kathi Der, MD;  Location: [facility];  Service: Gastroenterology;  Laterality: N/A;   EXPLORATORY LAPARTOMY /  CHOLECYSTECTOMY  02/28/2005   for Small  bowel obstruction (mechnical)   IR ANGIO EXTRACRAN SEL COM CAROTID INNOMINATE UNI L MOD SED  10/24/2016   IR ANGIO VERTEBRAL SEL SUBCLAVIAN INNOMINATE BILAT MOD SED  10/24/2016   IR PERCUTANEOUS ART THROMBECTOMY/INFUSION INTRACRANIAL INC DIAG ANGIO  10/24/2016   IR RADIOLOGIST EVAL & MGMT  12/05/2016   LEFT KNEE ARTHROSCOPY  2006   MAZE N/A 01/30/2018   Procedure: MAZE;  Surgeon: Purcell Nails, MD;  Location: [facility] OR;  Service: Open Heart Surgery;  Laterality: N/A;   MITRAL VALVE REPAIR N/A 01/30/2018   Procedure: MITRAL VALVE REPAIR (MVR) USING CARBOMEDICS ANNULOFLEX SIZE 28;  Surgeon: Purcell Nails, MD;  Location:  [facility] OR;  Service: Open Heart Surgery;  Laterality: N/A;   POLYPECTOMY  10/21/2018   Procedure: POLYPECTOMY;  Surgeon: Kathi Der, MD;  Location: [facility];  Service: Gastroenterology;;   RADIOLOGY WITH ANESTHESIA N/A 10/24/2016   Procedure: RADIOLOGY WITH ANESTHESIA;  Surgeon: Julieanne Cotton, MD;  Location: [facility] OR;  Service: Radiology;  Laterality: N/A;   RIGHT FOOT SURGERY     RIGHT MINIATURE ANTERIOR THORACOTOMY FOR AORTIC VALVE REPLACEMENT AND CLOSURE PATENT FORAMEN OVALE  08-03-2010  DR Neldon Labella Magna-ease pericardial tissue valve (25mm)   RIGHT/LEFT HEART CATH AND CORONARY ANGIOGRAPHY N/A 10/14/2017   Procedure: RIGHT/LEFT HEART CATH AND CORONARY ANGIOGRAPHY;  Surgeon: Tonny Bollman, MD;  Location: [facility] INVASIVE  CV LAB;  Service: Cardiovascular;  Laterality: N/A;   TEE WITHOUT CARDIOVERSION N/A 10/14/2017   Procedure: TRANSESOPHAGEAL ECHOCARDIOGRAM (TEE);  Surgeon: Wendall Stade, MD;  Location: [facility] ENDOSCOPY;  Service: Cardiovascular;  Laterality: N/A;   TOTAL HIP ARTHROPLASTY Left 08/02/2020   Procedure: TOTAL HIP ARTHROPLASTY ANTERIOR APPROACH;  Surgeon: Durene Romans, MD;  Location: [facility];  Service: Orthopedics;  Laterality: Left;   TOTAL KNEE ARTHROPLASTY Left 09/19/2021   Procedure: TOTAL KNEE ARTHROPLASTY;  Surgeon: Durene Romans, MD;  Location: [facility];  Service: Orthopedics;  Laterality: Left;   TOTAL KNEE ARTHROPLASTY Right 08/09/2022   Procedure: TOTAL KNEE ARTHROPLASTY;  Surgeon: Durene Romans, MD;  Location: [facility];  Service: Orthopedics;  Laterality: Right;  90   TRANSTHORACIC ECHOCARDIOGRAM  05/30/2016  dr skains   moderate  LVH ef 60-65%/  bioprothesis aortic valve present ,normal grandient and no AI /  mild MV calcification , moderate MR /  mild PR/ moderate TR/  PASP 31mmHg/ (RA denisty was identified 04-27-2016 echo) and is seen again today, this is likely a promient eustacian ridge, atrium is normal size   TRICUSPID VALVE REPLACEMENT N/A 01/30/2018   Procedure: TRICUSPID VALVE REPAIR USING MC3 SIZE 28;  Surgeon: Purcell Nails, MD;  Location: [facility] OR;  Service: Open Heart Surgery;  Laterality: N/A;   Social History:  reports that he has never smoked. He has never used smokeless tobacco. He reports current alcohol use. He reports that he does not use drugs.  Allergies  Allergen Reactions   Sulfa Antibiotics Other (See Comments)    Granulocytosis   Sulfamethoxazole-Trimethoprim     Other Reaction(s): Unknown   Zestril [Lisinopril] Cough    Family History  Problem Relation Age of Onset   Heart disease Mother    Brain cancer Father     Prior to Admission medications   Medication Sig Start Date End Date Taking? Authorizing Provider  allopurinol (ZYLOPRIM) 300 MG tablet Take 150 mg  by mouth every morning.    [provider]  amLODipine (NORVASC) 10 MG tablet TAKE 1 TABLET BY MOUTH DAILY 07/17/22   Jake Bathe, MD  atenolol (TENORMIN) 25 MG tablet TAKE ONE-HALF TABLET BY MOUTH  DAILY 09/20/22   Jake Bathe, MD  cholecalciferol (VITAMIN D3) 25 MCG (1000 UNIT) tablet Take 1,000 Units by mouth daily.    [provider]  Cinnamon 500 MG capsule Take 500 mg by mouth every morning.    [provider]  ferrous sulfate 325 (65 FE) MG EC tablet Take 325 mg by mouth 2 (two) times daily. 10/29/19   [provider]  finasteride (PROSCAR) 5 MG tablet Take 1 tablet (5 mg total) by mouth daily. 11/05/16   Angiulli, Mcarthur Rossetti, PA-C  Multiple Vitamins-Minerals (CENTRUM SILVER ADULT  50+) TABS Take 1 tablet by mouth daily.    [provider]  rosuvastatin (CRESTOR) 10 MG tablet TAKE 1 TABLET BY MOUTH DAILY 07/17/22   Jake Bathe, MD  silodosin (RAPAFLO) 8 MG CAPS capsule Take 8 mg by mouth daily. 11/02/21   [provider]  traZODone (DESYREL) 50 MG tablet Take 50 mg by mouth at bedtime as needed for sleep.    [provider]  warfarin (COUMADIN) 5 MG tablet TAKE 1 TO 1 AND 1/2 TABLETS BY  MOUTH DAILY OR AS DIRECTED BY  ANTICOAGULATION CLINIC 01/16/23   Jake Bathe, MD    Physical Exam: Vitals:   02/02/23 0730 02/02/23 0758 02/02/23 0800 02/02/23 0853  BP: 138/70   117/62  Pulse: (!) 107   (!) 111  Resp: (!) 30   (!) 26  Temp:   98.8 F (37.1 C)   SpO2: (!) 89%   94%  Weight:  79.8 kg    Height:  6' (1.829 m)     Physical Exam Vitals and nursing note reviewed.  Constitutional:      Appearance: He is well-developed. He is ill-appearing.  HENT:     Head: Normocephalic.  Eyes:     Pupils: Pupils are equal, round, and reactive to light.  Neck:     Vascular: No JVD.  Cardiovascular:     Rate and Rhythm: Normal rate and regular rhythm.  Pulmonary:     Effort: Pulmonary effort is normal. Tachypnea present.      Breath sounds: No wheezing, rhonchi or rales.  Musculoskeletal:     Cervical back: Neck supple.     Right lower leg: Edema present.     Left lower leg: Edema present.  Skin:    General: Skin is warm and dry.  Neurological:     General: No focal deficit present.     Mental Status: He is alert and oriented to person, place, and time.  Psychiatric:        Mood and Affect: Mood normal.        Behavior: Behavior normal.     Data Reviewed:  Results are pending, will review when available. 03/19/2022 echocardiogram report. IMPRESSIONS:   1. Left ventricular ejection fraction, by estimation, is 65 to 70%. The  left ventricle has normal function. The left ventricle has no regional  wall motion abnormalities. There is moderate concentric left ventricular  hypertrophy. Left ventricular  diastolic function could not be evaluated. Elevated left ventricular  end-diastolic pressure.   2. Right ventricular systolic function is normal. The right ventricular  size is normal. There is severely elevated pulmonary artery systolic  pressure.   3. Left atrial size was severely dilated.   4. Right atrial size was severely dilated.   5. The mitral valve has been repaired/replaced. Trivial mitral valve  regurgitation. No evidence of mitral stenosis. The mean mitral valve  gradient is 9.0 mmHg. Echo findings are consistent with normal structure  and function of the mitral valve  prosthesis.   6. Tricuspid valve mean inflow gradient 1.72 mmHg. The tricuspid valve is  has been repaired/replaced. The tricuspid valve is status post repair with  an annuloplasty ring.   7. Aortic valve gradient unchanged from 03/2021. The aortic valve has been  repaired/replaced. Aortic valve regurgitation is trivial. No aortic  stenosis is present. Echo findings are consistent with normal structure  and function of the aortic valve  prosthesis. Aortic valve mean gradient measures 15.4 mmHg. Aortic valve  Vmax measures  2.60 m/s.   8. Pulmonic valve regurgitation is moderate.   9. Aortic dilatation noted. There is mild dilatation of the aortic root  and of the ascending aorta, measuring 40 mm.  10. The inferior vena cava is dilated in size with <50% respiratory  variability, suggesting right atrial pressure of 15 mmHg.   EKG: Vent. rate 81 BPM PR interval * ms QRS duration 105 ms QT/QTcB 539/565 ms P-R-T axes * 110 89 Atrial fibrillation Probable lateral infarct, age indeterminate Anteroseptal infarct, age indeterminate  Assessment and Plan: Principal Problem:   Acute respiratory failure with hypoxia (HCC) In the setting of:   Volume overload  Observation/telemetry. Supplemental oxygen as needed. Sodium and fluid restriction. Continue furosemide 40 mg IVP twice daily. Continue atenolol 12.5 mg p.o. daily. Monitor daily weights, intake and output. Monitor renal function/electrolytes. Check echocardiogram. Cardiology consult appreciated.  Active Problems:   s/p mitral valve repair   S/P AVR (aortic valve replacement)   S/P tricuspid valve repair On warfarin. Echocardiogram has been ordered. Continue treatment as above.    Other secondary pulmonary hypertension (HCC) Continue with diuretics and antihypertensives as above.    Essential hypertension Hold amlodipine given furosemide use. Continue atenolol 12.5 mg p.o. daily.    Permanent atrial fibrillation (HCC)   S/P Maze operation for atrial fibrillation CHA?DS?-VASc Score is at least 7. On atenolol 12.5 mg p.o. daily. Continue warfarin per pharmacy.    CAD (coronary artery disease) Associated with the manage ischemia   Elevated troponin Continue atenolol, rosuvastatin and warfarin.    Anemia due to chronic blood loss Monitor hematocrit and hemoglobin. Transfuse if hemoglobin level goes below 8.0 g/dL.    Benign prostatic hyperplasia with lower urinary tract symptoms Continue alpha-blocker and finasteride. Follow-up with  urology as an outpatient.    Thrombocytopenia (HCC) Monitor platelet count.    Gout Continue allopurinol 150 mg p.o. daily.    Hypokalemia Replacement ordered.    History of ischemic right MCA stroke   Left hemiparesis (HCC) Supportive care.    Hyperglycemia   Prediabetes Monitor fasting glucose. Check hemoglobin A1c.    HLD (hyperlipidemia) Continue rosuvastatin 10 mg p.o. daily.     Advance Care Planning:   Code Status: Full Code   Consults: The cardiology service has placed him on their rounding list.  Family Communication:   Severity of Illness: The appropriate patient status for this patient is OBSERVATION. Observation status is judged to be reasonable and necessary in order to provide the required intensity of service to ensure the patient's safety. The patient's presenting symptoms, physical exam findings, and initial radiographic and laboratory data in the context of their medical condition is felt to place them at decreased risk for further clinical deterioration. Furthermore, it is anticipated that the patient will be medically stable for discharge from the hospital within 2 midnights of admission.   Author: Bobette Mo, MD 02/02/2023 9:35 AM  For on call review [email or web address].   This document was prepared using Dragon voice recognition software and may contain some unintended transcription errors.

## 2023-02-02 NOTE — Progress Notes (Signed)
PHARMACY - ANTICOAGULATION CONSULT NOTE  Pharmacy Consult for warfarin Indication: afib, CVA, s/p mitral valve repair  Allergies  Allergen Reactions   Sulfa Antibiotics Other (See Comments)    Granulocytosis   Sulfamethoxazole-Trimethoprim     Other Reaction(s): Unknown   Zestril [Lisinopril] Cough    Patient Measurements: Height: 6' (182.9 cm) Weight: 79.8 kg (175 lb 14.8 oz) IBW/kg (Calculated) : 77.6  Vital Signs: Temp: 98.1 F (36.7 C) (01/25 1148) Temp Source: Oral (01/25 1148) BP: 105/56 (01/25 1245) Pulse Rate: 60 (01/25 1245)  Labs: Recent Labs    02/02/23 0806 02/02/23 0950  HGB 10.3*  --   HCT 32.5*  --   PLT 128*  --   LABPROT 27.7*  --   INR 2.6*  --   CREATININE 1.19  --   TROPONINIHS 34* 59*    Estimated Creatinine Clearance: 49.8 mL/min (by C-G formula based on SCr of 1.19 mg/dL).   Medical History: Past Medical History:  Diagnosis Date   Acquired dilation of ascending aorta and aortic root (HCC)    43mm aortic root and 41mm ascending aorta on echo 03/2020   Arthritis    Bladder stones    Borderline diabetes    BPH (benign prostatic hyperplasia)    CAD (coronary artery disease) 12/19/2020   Non-obstructive coronary artery disease at cath in 2019   Complication of anesthesia    problem with voiding after anesthesia,   Coronary artery disease    cardiologist-  dr Rockey Situ gerhart NP--- per cath 06-02-2010 non-obstructive cad pLAD 30-40%   CVA (cerebral vascular accident) (HCC) 10/24/2016   Diverticulosis of colon    Dysrhythmia    afib   Gout    Heart murmur    History of adenomatous polyp of colon    2002-- tubular adenoma   History of aortic insufficiency    severe -- s/p  AVR 08-03-2010   History of small bowel obstruction    02/ 2007 mechanical sbo s/p  surgical intervention;  partial sbo 09/ 2011 and 03-20-2011 resolved without surgical intervention   History of urinary retention    HTN (hypertension)    Other secondary  pulmonary hypertension (HCC) 03/20/2022   TTE 03/19/22: EF 65-70, no RWMA, moderate LVH, normal RVSF, severe pulmonary hypertension (RVSP 60.4), severe BAE, normal structure and function of mitral valve repair (mean gradient 9), mild TR, trivial AI, normal structure and function of AVR (mean 15.4), moderate PI, aortic root and ascending aorta 40 mm, RAP 15   Peripheral neuropathy    Persistent atrial fibrillation (HCC)    Pre-diabetes    S/P aortic valve replacement with prosthetic valve 08/03/2010   tissue valve   S/P Maze operation for atrial fibrillation 01/30/2018   Complete bilateral atrial lesion set using bipolar radiofrequency and cryothermy with clipping of LA appendage   S/P MVR (mitral valve repair) 01/30/2018   Complex valvuloplasty including artificial Gore-tex neochord placement x4 and Carbo medics Annuloflex ring annuloplasty, size 28   S/P patent foramen ovale closure 08/03/2010   at same time AVR   S/P tricuspid valve repair 01/30/2018   Using an MC3 Annuloplasty ring, size 28   Stroke (HCC)    Thrombocytopenia (HCC)    Tricuspid regurgitation      Assessment: 88 year old male presented with shortness of breath. Cardiology consulted for fluid overload, suspicion for acute on chronic HFpEF started on IV diuresis. Patient is on warfarin PTA for afib, history of CVA and mitral valve repair.  Per patient and confirmed by recent anticoag visit, home warfarin regimen 5 mg daily except for 7.5 mg on Mondays. It appears he has been stable on this dose for several months.  INR therapeutic at 2.6 on admission. CBC stable.  Goal of Therapy:  INR 2-3 (from anticoag clinic notes) Monitor platelets by anticoagulation protocol: Yes   Plan:  -Continue warfarin at home dose -Will check INR daily for now; can decrease frequency if INR remains stable -Monitor for signs/symptoms of bleeding   Pricilla Riffle, PharmD, BCPS Clinical Pharmacist 02/02/2023 1:13 PM

## 2023-02-02 NOTE — ED Triage Notes (Signed)
Patient brought in by EMS due to shortness of breath. Reports it started some last night. This morning woke up and could not breathe. EMS reports audible wheezing upon their arrival. Administered 2 duonebs and 125mg  solumedrol. Patient was found with an O2 sat of 85% at home. Does not wear oxygen. Upon arrival to the ER, patient had one episode of vomiting.

## 2023-02-02 NOTE — ED Notes (Signed)
Pt's son called wanting an update. Phone number is [phone]

## 2023-02-02 NOTE — ED Provider Notes (Signed)
[place] [facility] AT [facility] Provider Note   CSN: [id number] Arrival date & time: 02/02/23  4098     History  Chief Complaint  Patient presents with   Shortness of Breath    [name]. is a 88 y.o. male.  Patient here shortness of breath started last night woke up could not breathe got breathing treatment with EMS.  Was found to have oxygen sats in 85%.  He wears no oxygen at home.  He felt a little bit nauseous.  He has history of valvular heart disease on Coumadin, A-fib, CAD, stroke, pulmonary hypertension but denies any history of heart failure.  He felt chills and cold overnight but no fever.  Denies any weakness numbness tingling.  He has noticed leg swelling the last couple weeks based on any diuretics.  The history is provided by the patient.       Home Medications Prior to Admission medications   Medication Sig Start Date End Date Taking? Authorizing Provider  allopurinol (ZYLOPRIM) 300 MG tablet Take 150 mg by mouth every morning.    [provider]  amLODipine (NORVASC) 10 MG tablet TAKE 1 TABLET BY MOUTH DAILY 07/17/22   Jake Bathe, MD  atenolol (TENORMIN) 25 MG tablet TAKE ONE-HALF TABLET BY MOUTH  DAILY 09/20/22   Jake Bathe, MD  cholecalciferol (VITAMIN D3) 25 MCG (1000 UNIT) tablet Take 1,000 Units by mouth daily.    [provider]  Cinnamon 500 MG capsule Take 500 mg by mouth every morning.    [provider]  ferrous sulfate 325 (65 FE) MG EC tablet Take 325 mg by mouth 2 (two) times daily. 10/29/19   [provider]  finasteride (PROSCAR) 5 MG tablet Take 1 tablet (5 mg total) by mouth daily. 11/05/16   Angiulli, Mcarthur Rossetti, PA-C  Multiple Vitamins-Minerals (CENTRUM SILVER ADULT 50+) TABS Take 1 tablet by mouth daily.    [provider]  rosuvastatin (CRESTOR) 10 MG tablet TAKE 1 TABLET BY MOUTH DAILY 07/17/22   Jake Bathe, MD  silodosin (RAPAFLO) 8 MG CAPS capsule Take 8  mg by mouth daily. 11/02/21   [provider]  traZODone (DESYREL) 50 MG tablet Take 50 mg by mouth at bedtime as needed for sleep.    [provider]  warfarin (COUMADIN) 5 MG tablet TAKE 1 TO 1 AND 1/2 TABLETS BY  MOUTH DAILY OR AS DIRECTED BY  ANTICOAGULATION CLINIC 01/16/23   Jake Bathe, MD      Allergies    Sulfa antibiotics, Sulfamethoxazole-trimethoprim, and Zestril [lisinopril]    Review of Systems   Review of Systems  Physical Exam Updated Vital Signs BP 117/62 (BP Location: Right Arm)   Pulse (!) 111   Temp 98.8 F (37.1 C)   Resp (!) 26   Ht 6' (1.829 m)   Wt 79.8 kg   SpO2 94%   BMI 23.86 kg/m  Physical Exam Vitals and nursing note reviewed.  Constitutional:      General: He is not in acute distress.    Appearance: He is well-developed. He is not ill-appearing.  HENT:     Head: Normocephalic and atraumatic.     Mouth/Throat:     Mouth: Mucous membranes are moist.  Eyes:     Extraocular Movements: Extraocular movements intact.     Conjunctiva/sclera: Conjunctivae normal.     Pupils: Pupils are equal, round, and reactive to light.  Cardiovascular:  Rate and Rhythm: Tachycardia present. Rhythm irregular.     Pulses: Normal pulses.     Heart sounds: Normal heart sounds. No murmur heard. Pulmonary:     Effort: Tachypnea present. No respiratory distress.     Breath sounds: Decreased breath sounds present.  Abdominal:     Palpations: Abdomen is soft.     Tenderness: There is no abdominal tenderness.  Musculoskeletal:        General: No swelling.     Cervical back: Normal range of motion and neck supple.     Right lower leg: Edema present.     Left lower leg: Edema present.  Skin:    General: Skin is warm and dry.     Capillary Refill: Capillary refill takes less than 2 seconds.  Neurological:     Mental Status: He is alert.  Psychiatric:        Mood and Affect: Mood normal.     ED Results / Procedures / Treatments   Labs (all  labs ordered are listed, but only abnormal results are displayed) Labs Reviewed  CBC WITH DIFFERENTIAL/PLATELET - Abnormal; Notable for the following components:      Result Value   RBC 3.41 (*)    Hemoglobin 10.3 (*)    HCT 32.5 (*)    RDW 15.9 (*)    Platelets 128 (*)    Neutro Abs 8.1 (*)    Lymphs Abs 0.3 (*)    All other components within normal limits  COMPREHENSIVE METABOLIC PANEL - Abnormal; Notable for the following components:   Potassium 3.4 (*)    CO2 19 (*)    Glucose, Bld 114 (*)    Calcium 8.8 (*)    Total Bilirubin 1.3 (*)    GFR, Estimated 60 (*)    All other components within normal limits  PROTIME-INR - Abnormal; Notable for the following components:   Prothrombin Time 27.7 (*)    INR 2.6 (*)    All other components within normal limits  TROPONIN I (HIGH SENSITIVITY) - Abnormal; Notable for the following components:   Troponin I (High Sensitivity) 34 (*)    All other components within normal limits  RESP PANEL BY RT-PCR (RSV, FLU A&B, COVID)  RVPGX2  BRAIN NATRIURETIC PEPTIDE  TROPONIN I (HIGH SENSITIVITY)    EKG EKG Interpretation Date/Time:  Saturday February 02 2023 08:38:26 EST Ventricular Rate:  81 PR Interval:    QRS Duration:  105 QT Interval:  539 QTC Calculation: 565 R Axis:   110  Text Interpretation: Atrial fibrillation Probable lateral infarct, age indeterminate Anteroseptal infarct, age indeterminate Confirmed by Virgina Norfolk [phone]) on 02/02/2023 8:44:09 AM  Radiology DG Chest Portable 1 View Result Date: 02/02/2023 CLINICAL DATA:  Shortness of breath. EXAM: PORTABLE CHEST 1 VIEW COMPARISON:  02/16/2022 FINDINGS: The cardio pericardial silhouette is enlarged. Diffuse interstitial opacity suggests edema. No focal lung consolidation. No substantial pleural effusion. Bones are diffusely demineralized. Telemetry leads overlie the chest. IMPRESSION: Enlargement of the cardiopericardial silhouette with diffuse interstitial opacity suggesting  edema. Electronically Signed   By: Kennith Center M.D.   On: 02/02/2023 08:21    Procedures .Critical Care  Performed by: Virgina Norfolk, DO Authorized by: Virgina Norfolk, DO   Critical care provider statement:    Critical care time (minutes):  35   Critical care was necessary to treat or prevent imminent or life-threatening deterioration of the following conditions:  Respiratory failure   Critical care was time spent personally by me on the  following activities:  Blood draw for specimens, development of treatment plan with patient or surrogate, discussions with primary provider, evaluation of patient's response to treatment, examination of patient, obtaining history from patient or surrogate, ordering and performing treatments and interventions, ordering and review of laboratory studies, ordering and review of radiographic studies, pulse oximetry, re-evaluation of patient's condition and review of old charts   Care discussed with: admitting provider       Medications Ordered in ED Medications  furosemide (LASIX) injection 40 mg (40 mg Intravenous Given 02/02/23 0949)    ED Course/ Medical Decision Making/ A&P                                 Medical Decision Making Amount and/or Complexity of Data Reviewed Labs: ordered. Radiology: ordered.  Risk Prescription drug management. Decision regarding hospitalization.   [name]. is here with shortness of breath.  New hypoxia.  80% on room air with EMS put on 2 L of oxygen.  He is tachypneic with signs of volume overload with leg swelling last couple weeks.  He has a history of heart valve replacement on Coumadin.  History of diabetes stroke A-fib.  History of pulmonary hypertension.  He denies being on any diuretics.  He has had some chills as well.  Differential could be volume overload in the setting of a cardiac process valvular disease or heart failure he has not had any chest pain but could be ACS, could be viral process  could be infectious process.  Will check CBC CMP BNP troponin chest x-ray EKG shows rate controlled atrial fibrillation.  Overall given new hypoxia anticipate admission.  He got breathing treatment with EMS.  But he has no history of COPD asthma or smoking history.  Per my review and interpretation labs no significant leukocytosis or anemia or electrolyte abnormality or kidney injury.  Troponin 34 and around baseline.  Chest x-ray per my review interpretation consistent with volume overload.  INR is at goal at 2.6.  Overall I do think that this is likely volume overload likely secondary to a cardiac process.  BNP is in process.  Will give diuretic with IV Lasix and admit to medicine for further care.  He is got new oxygen requirement.  He is on Coumadin and doubt PE.  Does not appear to be an infectious process with negative viral testing and no obvious pneumonia.  Patient admitted to medicine for further care.  This chart was dictated using voice recognition software.  Despite best efforts to proofread,  errors can occur which can change the documentation meaning.         Final Clinical Impression(s) / ED Diagnoses Final diagnoses:  Acute respiratory failure with hypoxia (HCC)  Hypervolemia, unspecified hypervolemia type    Rx / DC Orders ED Discharge Orders     None         Virgina Norfolk, DO 02/02/23 [phone]

## 2023-02-03 ENCOUNTER — Observation Stay (HOSPITAL_COMMUNITY): Payer: Medicare Other

## 2023-02-03 ENCOUNTER — Encounter (HOSPITAL_COMMUNITY): Payer: Self-pay | Admitting: Radiology

## 2023-02-03 DIAGNOSIS — I63412 Cerebral infarction due to embolism of left middle cerebral artery: Secondary | ICD-10-CM | POA: Diagnosis not present

## 2023-02-03 DIAGNOSIS — K567 Ileus, unspecified: Secondary | ICD-10-CM | POA: Diagnosis not present

## 2023-02-03 DIAGNOSIS — R531 Weakness: Secondary | ICD-10-CM | POA: Diagnosis not present

## 2023-02-03 DIAGNOSIS — I5033 Acute on chronic diastolic (congestive) heart failure: Secondary | ICD-10-CM | POA: Diagnosis not present

## 2023-02-03 DIAGNOSIS — J449 Chronic obstructive pulmonary disease, unspecified: Secondary | ICD-10-CM | POA: Diagnosis present

## 2023-02-03 DIAGNOSIS — R0602 Shortness of breath: Secondary | ICD-10-CM

## 2023-02-03 DIAGNOSIS — D696 Thrombocytopenia, unspecified: Secondary | ICD-10-CM | POA: Diagnosis not present

## 2023-02-03 DIAGNOSIS — I2489 Other forms of acute ischemic heart disease: Secondary | ICD-10-CM | POA: Diagnosis not present

## 2023-02-03 DIAGNOSIS — E440 Moderate protein-calorie malnutrition: Secondary | ICD-10-CM | POA: Diagnosis not present

## 2023-02-03 DIAGNOSIS — I959 Hypotension, unspecified: Secondary | ICD-10-CM | POA: Diagnosis not present

## 2023-02-03 DIAGNOSIS — J9601 Acute respiratory failure with hypoxia: Secondary | ICD-10-CM | POA: Diagnosis not present

## 2023-02-03 DIAGNOSIS — Z9889 Other specified postprocedural states: Secondary | ICD-10-CM | POA: Diagnosis not present

## 2023-02-03 DIAGNOSIS — A4159 Other Gram-negative sepsis: Secondary | ICD-10-CM | POA: Diagnosis not present

## 2023-02-03 DIAGNOSIS — G9389 Other specified disorders of brain: Secondary | ICD-10-CM | POA: Diagnosis not present

## 2023-02-03 DIAGNOSIS — Z66 Do not resuscitate: Secondary | ICD-10-CM | POA: Diagnosis not present

## 2023-02-03 DIAGNOSIS — I110 Hypertensive heart disease with heart failure: Secondary | ICD-10-CM | POA: Diagnosis present

## 2023-02-03 DIAGNOSIS — M6281 Muscle weakness (generalized): Secondary | ICD-10-CM | POA: Diagnosis not present

## 2023-02-03 DIAGNOSIS — I517 Cardiomegaly: Secondary | ICD-10-CM | POA: Diagnosis not present

## 2023-02-03 DIAGNOSIS — Z8673 Personal history of transient ischemic attack (TIA), and cerebral infarction without residual deficits: Secondary | ICD-10-CM | POA: Diagnosis not present

## 2023-02-03 DIAGNOSIS — I4821 Permanent atrial fibrillation: Secondary | ICD-10-CM | POA: Diagnosis not present

## 2023-02-03 DIAGNOSIS — R7881 Bacteremia: Secondary | ICD-10-CM | POA: Diagnosis not present

## 2023-02-03 DIAGNOSIS — Z452 Encounter for adjustment and management of vascular access device: Secondary | ICD-10-CM | POA: Diagnosis not present

## 2023-02-03 DIAGNOSIS — Z1152 Encounter for screening for COVID-19: Secondary | ICD-10-CM | POA: Diagnosis not present

## 2023-02-03 DIAGNOSIS — D62 Acute posthemorrhagic anemia: Secondary | ICD-10-CM | POA: Diagnosis not present

## 2023-02-03 DIAGNOSIS — K5669 Other partial intestinal obstruction: Secondary | ICD-10-CM | POA: Diagnosis not present

## 2023-02-03 DIAGNOSIS — Z48812 Encounter for surgical aftercare following surgery on the circulatory system: Secondary | ICD-10-CM | POA: Diagnosis not present

## 2023-02-03 DIAGNOSIS — A419 Sepsis, unspecified organism: Secondary | ICD-10-CM | POA: Diagnosis not present

## 2023-02-03 DIAGNOSIS — I2721 Secondary pulmonary arterial hypertension: Secondary | ICD-10-CM | POA: Diagnosis not present

## 2023-02-03 DIAGNOSIS — Z952 Presence of prosthetic heart valve: Secondary | ICD-10-CM | POA: Diagnosis not present

## 2023-02-03 DIAGNOSIS — Z953 Presence of xenogenic heart valve: Secondary | ICD-10-CM | POA: Diagnosis not present

## 2023-02-03 DIAGNOSIS — R739 Hyperglycemia, unspecified: Secondary | ICD-10-CM | POA: Diagnosis not present

## 2023-02-03 DIAGNOSIS — R6521 Severe sepsis with septic shock: Secondary | ICD-10-CM | POA: Diagnosis not present

## 2023-02-03 DIAGNOSIS — R29818 Other symptoms and signs involving the nervous system: Secondary | ICD-10-CM | POA: Diagnosis not present

## 2023-02-03 DIAGNOSIS — Z515 Encounter for palliative care: Secondary | ICD-10-CM | POA: Diagnosis not present

## 2023-02-03 DIAGNOSIS — I2729 Other secondary pulmonary hypertension: Secondary | ICD-10-CM | POA: Diagnosis not present

## 2023-02-03 DIAGNOSIS — I2510 Atherosclerotic heart disease of native coronary artery without angina pectoris: Secondary | ICD-10-CM | POA: Diagnosis not present

## 2023-02-03 DIAGNOSIS — R918 Other nonspecific abnormal finding of lung field: Secondary | ICD-10-CM | POA: Diagnosis not present

## 2023-02-03 DIAGNOSIS — E876 Hypokalemia: Secondary | ICD-10-CM | POA: Diagnosis not present

## 2023-02-03 DIAGNOSIS — E785 Hyperlipidemia, unspecified: Secondary | ICD-10-CM | POA: Diagnosis not present

## 2023-02-03 DIAGNOSIS — I4892 Unspecified atrial flutter: Secondary | ICD-10-CM | POA: Diagnosis present

## 2023-02-03 DIAGNOSIS — J9690 Respiratory failure, unspecified, unspecified whether with hypoxia or hypercapnia: Secondary | ICD-10-CM | POA: Diagnosis not present

## 2023-02-03 DIAGNOSIS — N179 Acute kidney failure, unspecified: Secondary | ICD-10-CM | POA: Diagnosis not present

## 2023-02-03 DIAGNOSIS — I6621 Occlusion and stenosis of right posterior cerebral artery: Secondary | ICD-10-CM | POA: Diagnosis not present

## 2023-02-03 DIAGNOSIS — N281 Cyst of kidney, acquired: Secondary | ICD-10-CM | POA: Diagnosis not present

## 2023-02-03 DIAGNOSIS — J690 Pneumonitis due to inhalation of food and vomit: Secondary | ICD-10-CM | POA: Diagnosis not present

## 2023-02-03 DIAGNOSIS — Z8679 Personal history of other diseases of the circulatory system: Secondary | ICD-10-CM | POA: Diagnosis not present

## 2023-02-03 DIAGNOSIS — I6782 Cerebral ischemia: Secondary | ICD-10-CM | POA: Diagnosis not present

## 2023-02-03 DIAGNOSIS — N136 Pyonephrosis: Secondary | ICD-10-CM | POA: Diagnosis present

## 2023-02-03 DIAGNOSIS — Z4682 Encounter for fitting and adjustment of non-vascular catheter: Secondary | ICD-10-CM | POA: Diagnosis not present

## 2023-02-03 DIAGNOSIS — E119 Type 2 diabetes mellitus without complications: Secondary | ICD-10-CM | POA: Diagnosis not present

## 2023-02-03 DIAGNOSIS — R112 Nausea with vomiting, unspecified: Secondary | ICD-10-CM | POA: Diagnosis not present

## 2023-02-03 DIAGNOSIS — A4181 Sepsis due to Enterococcus: Secondary | ICD-10-CM | POA: Diagnosis not present

## 2023-02-03 DIAGNOSIS — G8194 Hemiplegia, unspecified affecting left nondominant side: Secondary | ICD-10-CM | POA: Diagnosis not present

## 2023-02-03 DIAGNOSIS — N132 Hydronephrosis with renal and ureteral calculous obstruction: Secondary | ICD-10-CM | POA: Diagnosis not present

## 2023-02-03 DIAGNOSIS — I4891 Unspecified atrial fibrillation: Secondary | ICD-10-CM | POA: Diagnosis not present

## 2023-02-03 DIAGNOSIS — E46 Unspecified protein-calorie malnutrition: Secondary | ICD-10-CM | POA: Diagnosis not present

## 2023-02-03 DIAGNOSIS — I10 Essential (primary) hypertension: Secondary | ICD-10-CM | POA: Diagnosis not present

## 2023-02-03 DIAGNOSIS — N2889 Other specified disorders of kidney and ureter: Secondary | ICD-10-CM | POA: Diagnosis not present

## 2023-02-03 DIAGNOSIS — Z743 Need for continuous supervision: Secondary | ICD-10-CM | POA: Diagnosis not present

## 2023-02-03 DIAGNOSIS — R930 Abnormal findings on diagnostic imaging of skull and head, not elsewhere classified: Secondary | ICD-10-CM | POA: Diagnosis not present

## 2023-02-03 DIAGNOSIS — N200 Calculus of kidney: Secondary | ICD-10-CM | POA: Diagnosis not present

## 2023-02-03 DIAGNOSIS — Z96642 Presence of left artificial hip joint: Secondary | ICD-10-CM | POA: Diagnosis not present

## 2023-02-03 DIAGNOSIS — K5730 Diverticulosis of large intestine without perforation or abscess without bleeding: Secondary | ICD-10-CM | POA: Diagnosis not present

## 2023-02-03 DIAGNOSIS — Z8616 Personal history of COVID-19: Secondary | ICD-10-CM | POA: Diagnosis not present

## 2023-02-03 DIAGNOSIS — G459 Transient cerebral ischemic attack, unspecified: Secondary | ICD-10-CM | POA: Diagnosis not present

## 2023-02-03 DIAGNOSIS — Z7401 Bed confinement status: Secondary | ICD-10-CM | POA: Diagnosis not present

## 2023-02-03 DIAGNOSIS — D649 Anemia, unspecified: Secondary | ICD-10-CM | POA: Diagnosis not present

## 2023-02-03 DIAGNOSIS — R2681 Unsteadiness on feet: Secondary | ICD-10-CM | POA: Diagnosis not present

## 2023-02-03 DIAGNOSIS — N201 Calculus of ureter: Secondary | ICD-10-CM | POA: Diagnosis not present

## 2023-02-03 DIAGNOSIS — D500 Iron deficiency anemia secondary to blood loss (chronic): Secondary | ICD-10-CM | POA: Diagnosis not present

## 2023-02-03 DIAGNOSIS — R0989 Other specified symptoms and signs involving the circulatory and respiratory systems: Secondary | ICD-10-CM | POA: Diagnosis not present

## 2023-02-03 DIAGNOSIS — G9341 Metabolic encephalopathy: Secondary | ICD-10-CM | POA: Diagnosis not present

## 2023-02-03 DIAGNOSIS — R0902 Hypoxemia: Secondary | ICD-10-CM | POA: Diagnosis not present

## 2023-02-03 DIAGNOSIS — R310 Gross hematuria: Secondary | ICD-10-CM | POA: Diagnosis not present

## 2023-02-03 DIAGNOSIS — I639 Cerebral infarction, unspecified: Secondary | ICD-10-CM | POA: Diagnosis not present

## 2023-02-03 DIAGNOSIS — M1A9XX1 Chronic gout, unspecified, with tophus (tophi): Secondary | ICD-10-CM | POA: Diagnosis not present

## 2023-02-03 DIAGNOSIS — I63512 Cerebral infarction due to unspecified occlusion or stenosis of left middle cerebral artery: Secondary | ICD-10-CM | POA: Diagnosis not present

## 2023-02-03 DIAGNOSIS — D638 Anemia in other chronic diseases classified elsewhere: Secondary | ICD-10-CM | POA: Diagnosis present

## 2023-02-03 LAB — URINALYSIS, ROUTINE W REFLEX MICROSCOPIC
Bilirubin Urine: NEGATIVE
Glucose, UA: NEGATIVE mg/dL
Ketones, ur: NEGATIVE mg/dL
Nitrite: NEGATIVE
Protein, ur: NEGATIVE mg/dL
Specific Gravity, Urine: 1.01 (ref 1.005–1.030)
WBC, UA: >50 WBC/hpf (ref 0–5)
pH: 6 (ref 5.0–8.0)

## 2023-02-03 LAB — CBC
HCT: 28.9 % — ABNORMAL LOW (ref 39.0–52.0)
Hemoglobin: 9.2 g/dL — ABNORMAL LOW (ref 13.0–17.0)
MCH: 30.2 pg (ref 26.0–34.0)
MCHC: 31.8 g/dL (ref 30.0–36.0)
MCV: 94.8 fL (ref 80.0–100.0)
Platelets: 114 10*3/uL — ABNORMAL LOW (ref 150–400)
RBC: 3.05 MIL/uL — ABNORMAL LOW (ref 4.22–5.81)
RDW: 16.3 % — ABNORMAL HIGH (ref 11.5–15.5)
WBC: 21.7 10*3/uL — ABNORMAL HIGH (ref 4.0–10.5)
nRBC: 0 % (ref 0.0–0.2)

## 2023-02-03 LAB — ECHOCARDIOGRAM COMPLETE
AR max vel: 1.99 cm2
AV Area VTI: 1.6 cm2
AV Area mean vel: 1.63 cm2
AV Mean grad: 14.2 mm[Hg]
AV Peak grad: 17.8 mm[Hg]
Ao pk vel: 2.11 m/s
Area-P 1/2: 1.86 cm2
Calc EF: 54.9 %
Height: 72 in
MV VTI: 1.1 cm2
S' Lateral: 3.5 cm
Single Plane A2C EF: 54.1 %
Single Plane A4C EF: 58.4 %
Weight: 2772.5 [oz_av]

## 2023-02-03 LAB — MAGNESIUM: Magnesium: 2.3 mg/dL (ref 1.7–2.4)

## 2023-02-03 LAB — COMPREHENSIVE METABOLIC PANEL
ALT: 16 U/L (ref 0–44)
AST: 27 U/L (ref 15–41)
Albumin: 3.4 g/dL — ABNORMAL LOW (ref 3.5–5.0)
Alkaline Phosphatase: 40 U/L (ref 38–126)
Anion gap: 12 (ref 5–15)
BUN: 40 mg/dL — ABNORMAL HIGH (ref 8–23)
CO2: 21 mmol/L — ABNORMAL LOW (ref 22–32)
Calcium: 8.7 mg/dL — ABNORMAL LOW (ref 8.9–10.3)
Chloride: 107 mmol/L (ref 98–111)
Creatinine, Ser: 1.47 mg/dL — ABNORMAL HIGH (ref 0.61–1.24)
GFR, Estimated: 46 mL/min — ABNORMAL LOW (ref >60)
Glucose, Bld: 184 mg/dL — ABNORMAL HIGH (ref 70–99)
Potassium: 4.7 mmol/L (ref 3.5–5.1)
Sodium: 140 mmol/L (ref 135–145)
Total Bilirubin: 1.4 mg/dL — ABNORMAL HIGH (ref 0.0–1.2)
Total Protein: 6.9 g/dL (ref 6.5–8.1)

## 2023-02-03 LAB — PROTIME-INR
INR: 2.5 — ABNORMAL HIGH (ref 0.8–1.2)
Prothrombin Time: 27.2 s — ABNORMAL HIGH (ref 11.4–15.2)

## 2023-02-03 LAB — LACTIC ACID, PLASMA: Lactic Acid, Venous: 3.1 mmol/L (ref 0.5–1.9)

## 2023-02-03 LAB — PROCALCITONIN: Procalcitonin: 42.01 ng/mL

## 2023-02-03 MED ORDER — PANTOPRAZOLE SODIUM 40 MG IV SOLR
40.0000 mg | Freq: Two times a day (BID) | INTRAVENOUS | Status: DC
  Administered 2023-02-03 – 2023-02-15 (×25): 40 mg via INTRAVENOUS
  Filled 2023-02-03 (×25): qty 10

## 2023-02-03 MED ORDER — ALBUMIN HUMAN 5 % IV SOLN
12.5000 g | Freq: Once | INTRAVENOUS | Status: AC
  Administered 2023-02-03: 12.5 g via INTRAVENOUS
  Filled 2023-02-03: qty 250

## 2023-02-03 MED ORDER — CHLORHEXIDINE GLUCONATE CLOTH 2 % EX PADS
6.0000 | MEDICATED_PAD | Freq: Every day | CUTANEOUS | Status: DC
  Administered 2023-02-03 – 2023-02-22 (×16): 6 via TOPICAL

## 2023-02-03 MED ORDER — ARFORMOTEROL TARTRATE 15 MCG/2ML IN NEBU
15.0000 ug | INHALATION_SOLUTION | Freq: Two times a day (BID) | RESPIRATORY_TRACT | Status: DC
  Administered 2023-02-03 – 2023-02-20 (×32): 15 ug via RESPIRATORY_TRACT
  Filled 2023-02-03 (×33): qty 2

## 2023-02-03 MED ORDER — ORAL CARE MOUTH RINSE
15.0000 mL | OROMUCOSAL | Status: DC
  Administered 2023-02-03 – 2023-02-22 (×64): 15 mL via OROMUCOSAL

## 2023-02-03 MED ORDER — IPRATROPIUM-ALBUTEROL 0.5-2.5 (3) MG/3ML IN SOLN
3.0000 mL | Freq: Four times a day (QID) | RESPIRATORY_TRACT | Status: DC
  Administered 2023-02-03: 3 mL via RESPIRATORY_TRACT
  Filled 2023-02-03: qty 3

## 2023-02-03 MED ORDER — BUDESONIDE 0.25 MG/2ML IN SUSP
0.2500 mg | Freq: Two times a day (BID) | RESPIRATORY_TRACT | Status: DC
  Administered 2023-02-03 – 2023-02-06 (×7): 0.25 mg via RESPIRATORY_TRACT
  Filled 2023-02-03 (×8): qty 2

## 2023-02-03 MED ORDER — SODIUM CHLORIDE 0.9 % IV SOLN: INTRAVENOUS | Status: AC | PRN

## 2023-02-03 MED ORDER — ORAL CARE MOUTH RINSE: 15.0000 mL | OROMUCOSAL | Status: DC | PRN

## 2023-02-03 MED ORDER — SODIUM CHLORIDE 0.9 % IV SOLN
3.0000 g | Freq: Four times a day (QID) | INTRAVENOUS | Status: DC
  Administered 2023-02-03 (×3): 3 g via INTRAVENOUS
  Filled 2023-02-03 (×3): qty 8

## 2023-02-03 MED ORDER — LACTATED RINGERS IV BOLUS
250.0000 mL | Freq: Once | INTRAVENOUS | Status: AC
Start: 2023-02-03 — End: 2023-02-03
  Administered 2023-02-03: 250 mL via INTRAVENOUS

## 2023-02-03 MED ORDER — IPRATROPIUM-ALBUTEROL 0.5-2.5 (3) MG/3ML IN SOLN
3.0000 mL | Freq: Three times a day (TID) | RESPIRATORY_TRACT | Status: DC
  Administered 2023-02-04: 3 mL via RESPIRATORY_TRACT
  Filled 2023-02-03: qty 3

## 2023-02-03 MED ORDER — PERFLUTREN LIPID MICROSPHERE
1.0000 mL | INTRAVENOUS | Status: AC | PRN
  Administered 2023-02-03: 1 mL via INTRAVENOUS

## 2023-02-03 NOTE — Progress Notes (Signed)
PHARMACY - ANTICOAGULATION CONSULT NOTE  Pharmacy Consult for warfarin --> heparin  Indication: afib, CVA, s/p mitral valve repair  Allergies  Allergen Reactions   Sulfa Antibiotics Other (See Comments)    Granulocytosis   Sulfamethoxazole-Trimethoprim     Other Reaction(s): Unknown   Zestril [Lisinopril] Cough    Patient Measurements: Height: 6' (182.9 cm) Weight: 78.6 kg (173 lb 4.5 oz) IBW/kg (Calculated) : 77.6  Vital Signs: Temp: 98.3 F (36.8 C) (01/26 1053) Temp Source: Oral (01/26 1053) BP: 153/80 (01/26 1053) Pulse Rate: 109 (01/26 1053)  Labs: Recent Labs    02/02/23 0806 02/02/23 0950 02/03/23 0402  HGB 10.3*  --  9.2*  HCT 32.5*  --  28.9*  PLT 128*  --  114*  LABPROT 27.7*  --  27.2*  INR 2.6*  --  2.5*  CREATININE 1.19  --  1.47*  TROPONINIHS 34* 59*  --     Estimated Creatinine Clearance: 40.3 mL/min (A) (by C-G formula based on SCr of 1.47 mg/dL (H)).   Medical History: Past Medical History:  Diagnosis Date   Acquired dilation of ascending aorta and aortic root (HCC)    43mm aortic root and 41mm ascending aorta on echo 03/2020   Arthritis    Bladder stones    Borderline diabetes    BPH (benign prostatic hyperplasia)    CAD (coronary artery disease) 12/19/2020   Non-obstructive coronary artery disease at cath in 2019   Complication of anesthesia    problem with voiding after anesthesia,   Coronary artery disease    cardiologist-  dr Rockey Situ gerhart NP--- per cath 06-02-2010 non-obstructive cad pLAD 30-40%   CVA (cerebral vascular accident) (HCC) 10/24/2016   Diverticulosis of colon    Dysrhythmia    afib   Gout    Heart murmur    History of adenomatous polyp of colon    2002-- tubular adenoma   History of aortic insufficiency    severe -- s/p  AVR 08-03-2010   History of small bowel obstruction    02/ 2007 mechanical sbo s/p  surgical intervention;  partial sbo 09/ 2011 and 03-20-2011 resolved without surgical intervention    History of urinary retention    HTN (hypertension)    Other secondary pulmonary hypertension (HCC) 03/20/2022   TTE 03/19/22: EF 65-70, no RWMA, moderate LVH, normal RVSF, severe pulmonary hypertension (RVSP 60.4), severe BAE, normal structure and function of mitral valve repair (mean gradient 9), mild TR, trivial AI, normal structure and function of AVR (mean 15.4), moderate PI, aortic root and ascending aorta 40 mm, RAP 15   Peripheral neuropathy    Persistent atrial fibrillation (HCC)    Pre-diabetes    S/P aortic valve replacement with prosthetic valve 08/03/2010   tissue valve   S/P Maze operation for atrial fibrillation 01/30/2018   Complete bilateral atrial lesion set using bipolar radiofrequency and cryothermy with clipping of LA appendage   S/P MVR (mitral valve repair) 01/30/2018   Complex valvuloplasty including artificial Gore-tex neochord placement x4 and Car[employer] medics Annuloflex ring annuloplasty, size 28   S/P patent foramen ovale closure 08/03/2010   at same time AVR   S/P tricuspid valve repair 01/30/2018   Using an MC3 Annuloplasty ring, size 28   Stroke (HCC)    Thrombocytopenia (HCC)    Tricuspid regurgitation      Assessment: 88 year old male presented with shortness of breath. Cardiology consulted for fluid overload, suspicion for acute on chronic HFpEF started on  IV diuresis. Patient is on warfarin PTA for afib, history of CVA and mitral valve repair.  Per patient and confirmed by recent anticoag visit, home warfarin regimen 5 mg daily except for 7.5 mg on Mondays. It appears he has been stable on this dose for several months.  Warfarin resumed on admission. Pharmacy consulted on 02/03/23 to change [facility] to heparin since pt is experiencing n/v.    Today, 02/03/2023: - INR is therapeutic at 2.5 - Hgb 9.2, plts 114K   Goal of Therapy:  INR 2-3 (from anticoag clinic notes) Monitor platelets by anticoagulation protocol: Yes   Plan:  - f/u with INR on 02/04/23. Will  start heparin drip when INR is <2   Lucia Gaskins, PharmD, BCPS Clinical Pharmacist 02/03/2023 11:20 AM

## 2023-02-03 NOTE — Progress Notes (Signed)
RT called to come assess pt that was SOB. Pt was given duoneb tx per MD. Pt tolerated well.  Pt did vomit times 2 with RN and MD present.  HR 112, RR 20, sats 96% on 4l .

## 2023-02-03 NOTE — Care Management Obs Status (Addendum)
MEDICARE OBSERVATION STATUS NOTIFICATION   Patient Details  Name: [name]. MRN: [id number] Date of Birth: April 08, 1935   Medicare Observation Status Notification Given:  Yes  I Windell Moulding, MSW, LCSW, attempted to contact patient's son [name], via telephone at [phone] to discuss observation status.  This CSW had to leave a message on voice mail awaiting for a call back from patient's son.  Darleene Cleaver, LCSW 02/03/2023, 1:39 PM

## 2023-02-03 NOTE — Plan of Care (Signed)
Heart failure teaching initiated with this patient. Signs and symptoms of respiratory distress improved during shift.

## 2023-02-03 NOTE — Progress Notes (Signed)
Pharmacy Antibiotic Note  [name]. is a 88 y.o. male who presented toi the ED on 02/02/2023 with c/o SOB.  Pharmacy has been consulted on 02/03/23 to dose unasyn for PNA.  Today, 02/03/2023: - scr up 1.47 (crcl~40) - wbc up 21.7, afeb - 4L [place]  Plan: - unasyn 3gm IV q6h  ________________________________________  Height: 6' (182.9 cm) Weight: 78.6 kg (173 lb 4.5 oz) IBW/kg (Calculated) : 77.6  Temp (24hrs), Avg:98.4 F (36.9 C), Min:97.8 F (36.6 C), Max:98.9 F (37.2 C)  Recent Labs  Lab 02/02/23 0806 02/03/23 0402  WBC 8.6 21.7*  CREATININE 1.19 1.47*    Estimated Creatinine Clearance: 40.3 mL/min (A) (by C-G formula based on SCr of 1.47 mg/dL (H)).    Allergies  Allergen Reactions   Sulfa Antibiotics Other (See Comments)    Granulocytosis   Sulfamethoxazole-Trimethoprim     Other Reaction(s): Unknown   Zestril [Lisinopril] Cough     Thank you for allowing pharmacy to be a part of this patient's care.  Lucia Gaskins 02/03/2023 11:10 AM

## 2023-02-03 NOTE — Progress Notes (Signed)
PROGRESS NOTE    [name].  ZHY:[id number] DOB: [birth year] DOA: 02/02/2023 PCP: Jarrett Soho, PA-C   Brief Narrative: 88 year old with past medical history significant for dilation of the ascending aorta and aortic root, CAD, A-fib, status post maze procedure, history of CVA, hypertension, aortic valve replacement, status post patent foramen ovale closure, status post mitral valve repair, tricuspid regurgitation, status post tricuspid valve repair, prediabetes, BPH, history of SBO, peripheral neuropathy presents complaining of dyspnea that wake him up.  Reports lower extremity edema for the last few weeks.  He was found to be hypoxic oxygen saturation 85 on room air.  Received IV Solu-Medrol and DuoNebs by EMS.  Evaluation in the ED chest x-ray showed cardiomegaly with diffuse interstitial opacity, negative COVID, influenza and RSV negative, normal white blood cell.  Admitted for heart failure exacerbation received IV Lasix.  Cardiology has been consulted.  Patient vomited, likely Aspirated and we have concern for SBO  Assessment & Plan:   Principal Problem:   Acute respiratory failure with hypoxia (HCC) Active Problems:   Essential hypertension   Permanent atrial fibrillation (HCC)   CAD (coronary artery disease)   s/p mitral valve repair   Anemia due to chronic blood loss   Benign prostatic hyperplasia with lower urinary tract symptoms   Thrombocytopenia (HCC)   Gout   Hypokalemia   Left hemiparesis (HCC)   S/P AVR (aortic valve replacement)   History of ischemic right MCA stroke   Hyperglycemia   S/P tricuspid valve repair   S/P Maze operation for atrial fibrillation   Prediabetes   HLD (hyperlipidemia)   Other secondary pulmonary hypertension (HCC)   Elevated troponin   Volume overload   1-Acute Hypoxic Respiratory Failure in the setting of Acute on Chronic Diastolic heart failure exacerbation -Patient presented with shortness of breath and lower  extremity edema, chest x-ray: enlargement of the cardiopericardial silhouette with diffuse interstitial opacity suggesting edema.  BNP 432, mild elevation of troponin. -on IV lasix, cardiology following.  -Worsening respiratory distress suspect in setting of aspiration PNA.  -Start Duo-neb, Pulmicort, IV antibiotics.  Oxygen requirement increased to 4 L.   2-Aspiration PNA;  He was vomiting this morning and last night. Suspect he aspirated. He is more SOB, tachypnea, abdominal distension.  -Start Unasyn.  -Schedule duo-neb, Pulmicort, brovana.  -he is having worsening leukocytosis.  BP soft, will give IV albumin. Monitor.   Possible SBO;  History of same/  Has abdominal distension, vomiting dark brown fluid. No BM in last 3 days, no passing gas.  NG tube placed.  Surgery consulted.  Check KUB.    Status post mitral valve replacement Status post AVR replacement Status post tricuspid valve repair meant -On Coumadin--hold coumadin-- start heparin when INR less than 2 -Follow echo -Cardiology following  Pulmonary hypertension: -on Diuretics,   Essential  hypertension: -Hold Norvasc, and atenolol, he is now NPO  Permanent A-fib Status post maze -on atenolol.  -hold coumadin, start heparin    CAD: -hold statins due to NPO  Anemia due to chronic blood loss -Monitor Hb  BPH Lower urinary tract symptoms -on Finasteride.   Thrombocytopenia: -follow trend.   Gout: -Resume allopurinol when taking oral.   Hypokalemia: -replaced.   History of ischemia right MCA stroke Left hemiparesis: -support care  Hyperglycemia, prediabetic -Monitor CBG  Hyperlipidemia: -holding statins.        Estimated body mass index is 23.5 kg/m as calculated from the following:   Height as of this encounter: 6' (  1.829 m).   Weight as of this encounter: 78.6 kg.   DVT prophylaxis: coumadin/heparin  Code Status: Full code Family Communication: Care discussed with wife, and  brother inlaw.  Disposition Plan:  Status is: Observation The patient will require care spanning > 2 midnights and should be moved to inpatient because: management of presume SBO, aspiration PNA    Consultants:  Cardiology Surgery   Procedures:  ECHO  Antimicrobials:    Subjective: Seem this morning, he was in respiratory distress, wheezing. He vomited last night and thi morning vomited multiples time. We made decision to place NG tube due to concern for aspiration.  He relates no BM or passing gas in 3 days.  Denies abdominal pain.  He report SOB this am, difficulty breathing.   Objective: Vitals:   02/03/23 0200 02/03/23 0215 02/03/23 0500 02/03/23 0509  BP:    115/76  Pulse:    62  Resp: (!) 21 19  (!) 24  Temp:    98.4 F (36.9 C)  TempSrc:    Oral  SpO2:    97%  Weight:   78.6 kg   Height:        Intake/Output Summary (Last 24 hours) at 02/03/2023 0738 Last data filed at 02/03/2023 0110 Gross per 24 hour  Intake 120 ml  Output 600 ml  Net -480 ml   Filed Weights   02/02/23 0758 02/03/23 0500  Weight: 79.8 kg 78.6 kg    Examination:  General exam: Appears calm and comfortable  Respiratory system: Tachypnea , increase work of breathing.  Cardiovascular system: S1 & S2 heard, RRR. Gastrointestinal system: BS decreased, distended, no rigidity Central nervous system: Alert and oriented. Extremities: Symmetric 5 x 5 power.   Data Reviewed: I have personally reviewed following labs and imaging studies  CBC: Recent Labs  Lab 02/02/23 0806 02/03/23 0402  WBC 8.6 21.7*  NEUTROABS 8.1*  --   HGB 10.3* 9.2*  HCT 32.5* 28.9*  MCV 95.3 94.8  PLT 128* 114*   Basic Metabolic Panel: Recent Labs  Lab 02/02/23 0806 02/03/23 0402  NA 143 140  K 3.4* 4.7  CL 110 107  CO2 19* 21*  GLUCOSE 114* 184*  BUN 22 40*  CREATININE 1.19 1.47*  CALCIUM 8.8* 8.7*  MG  --  2.3   GFR: Estimated Creatinine Clearance: 40.3 mL/min (A) (by C-G formula based on SCr  of 1.47 mg/dL (H)). Liver Function Tests: Recent Labs  Lab 02/02/23 0806 02/03/23 0402  AST 29 27  ALT 16 16  ALKPHOS 69 40  BILITOT 1.3* 1.4*  PROT 7.1 6.9  ALBUMIN 3.6 3.4*   No results for input(s): "LIPASE", "AMYLASE" in the last 168 hours. No results for input(s): "AMMONIA" in the last 168 hours. Coagulation Profile: Recent Labs  Lab 02/02/23 0806 02/03/23 0402  INR 2.6* 2.5*   Cardiac Enzymes: No results for input(s): "CKTOTAL", "CKMB", "CKMBINDEX", "TROPONINI" in the last 168 hours. BNP (last 3 results) No results for input(s): "PROBNP" in the last 8760 hours. HbA1C: No results for input(s): "HGBA1C" in the last 72 hours. CBG: No results for input(s): "GLUCAP" in the last 168 hours. Lipid Profile: No results for input(s): "CHOL", "HDL", "LDLCALC", "TRIG", "CHOLHDL", "LDLDIRECT" in the last 72 hours. Thyroid Function Tests: No results for input(s): "TSH", "T4TOTAL", "FREET4", "T3FREE", "THYROIDAB" in the last 72 hours. Anemia Panel: No results for input(s): "VITAMINB12", "FOLATE", "FERRITIN", "TIBC", "IRON", "RETICCTPCT" in the last 72 hours. Sepsis Labs: No results for input(s): "PROCALCITON", "LATICACIDVEN"  in the last 168 hours.  Recent Results (from the past 240 hours)  Resp panel by RT-PCR (RSV, Flu A&B, Covid) Anterior Nasal Swab     Status: None   Collection Time: 02/02/23  7:30 AM   Specimen: Anterior Nasal Swab  Result Value Ref Range Status   SARS Coronavirus 2 by RT PCR NEGATIVE NEGATIVE Final    Comment: (NOTE) SARS-CoV-2 target nucleic acids are NOT DETECTED.  The SARS-CoV-2 RNA is generally detectable in upper respiratory specimens during the acute phase of infection. The lowest concentration of SARS-CoV-2 viral copies this assay can detect is 138 copies/mL. A negative result does not preclude SARS-Cov-2 infection and should not be used as the sole basis for treatment or other patient management decisions. A negative result may occur with   improper specimen collection/handling, submission of specimen other than nasopharyngeal swab, presence of viral mutation(s) within the areas targeted by this assay, and inadequate number of viral copies(<138 copies/mL). A negative result must be combined with clinical observations, patient history, and epidemiological information. The expected result is Negative.  Fact Sheet for Patients:  BloggerCourse.com  Fact Sheet for Healthcare Providers:  SeriousBroker.it  This test is no t yet approved or cleared by the [place] FDA and  has been authorized for detection and/or diagnosis of SARS-CoV-2 by FDA under an Emergency Use Authorization (EUA). This EUA will remain  in effect (meaning this test can be used) for the duration of the COVID-19 declaration under Section 564(b)(1) of the Act, 21 U.S.C.section 360bbb-3(b)(1), unless the authorization is terminated  or revoked sooner.       Influenza A by PCR NEGATIVE NEGATIVE Final   Influenza B by PCR NEGATIVE NEGATIVE Final    Comment: (NOTE) The Xpert Xpress SARS-CoV-2/FLU/RSV plus assay is intended as an aid in the diagnosis of influenza from Nasopharyngeal swab specimens and should not be used as a sole basis for treatment. Nasal washings and aspirates are unacceptable for Xpert Xpress SARS-CoV-2/FLU/RSV testing.  Fact Sheet for Patients: BloggerCourse.com  Fact Sheet for Healthcare Providers: SeriousBroker.it  This test is not yet approved or cleared by the [place] FDA and has been authorized for detection and/or diagnosis of SARS-CoV-2 by FDA under an Emergency Use Authorization (EUA). This EUA will remain in effect (meaning this test can be used) for the duration of the COVID-19 declaration under Section 564(b)(1) of the Act, 21 U.S.C. section 360bbb-3(b)(1), unless the authorization is terminated or revoked.      Resp Syncytial Virus by PCR NEGATIVE NEGATIVE Final    Comment: (NOTE) Fact Sheet for Patients: BloggerCourse.com  Fact Sheet for Healthcare Providers: SeriousBroker.it  This test is not yet approved or cleared by the [place] FDA and has been authorized for detection and/or diagnosis of SARS-CoV-2 by FDA under an Emergency Use Authorization (EUA). This EUA will remain in effect (meaning this test can be used) for the duration of the COVID-19 declaration under Section 564(b)(1) of the Act, 21 U.S.C. section 360bbb-3(b)(1), unless the authorization is terminated or revoked.  Performed at [facility], [address], [place], [place] [address]          Radiology Studies: DG Chest Portable 1 View Result Date: 02/02/2023 CLINICAL DATA:  Shortness of breath. EXAM: PORTABLE CHEST 1 VIEW COMPARISON:  02/16/2022 FINDINGS: The cardio pericardial silhouette is enlarged. Diffuse interstitial opacity suggests edema. No focal lung consolidation. No substantial pleural effusion. Bones are diffusely demineralized. Telemetry leads overlie the chest. IMPRESSION: Enlargement of the cardiopericardial  silhouette with diffuse interstitial opacity suggesting edema. Electronically Signed   By: Kennith Center M.D.   On: 02/02/2023 08:21        Scheduled Meds:  allopurinol  150 mg Oral q morning   atenolol  12.5 mg Oral Daily   ferrous sulfate  325 mg Oral BID   finasteride  5 mg Oral Daily   furosemide  40 mg Intravenous BID   potassium chloride  40 mEq Oral BID   rosuvastatin  10 mg Oral Daily   tamsulosin  0.4 mg Oral QPC supper   warfarin  5 mg Oral Once per day on Sunday Tuesday Wednesday Thursday Friday Saturday   And   [START ON 02/04/2023] warfarin  7.5 mg Oral Every Monday   Warfarin - Pharmacist Dosing Inpatient   Does not apply q1600   Continuous Infusions:   LOS: 0 days    Time spent: 35 minutes.      Alba Cory, MD [facility]   If 7PM-7AM, please contact night-coverage [email or web address]  02/03/2023, 7:38 AM

## 2023-02-03 NOTE — Progress Notes (Signed)
   02/03/23 1053  Assess: MEWS Score  Temp 98.3 F (36.8 C)  BP (!) 153/80  MAP (mmHg) 104  Pulse Rate (!) 109  Resp (!) 40  SpO2 100 %  O2 Device Nasal Cannula  Patient Activity (if Appropriate) In bed  O2 Flow Rate (L/min) 3 L/min  Assess: MEWS Score  MEWS Temp 0  MEWS Systolic 0  MEWS Pulse 1  MEWS RR 3  MEWS LOC 0  MEWS Score 4  MEWS Score Color Red  Assess: if the MEWS score is Yellow or Red  Were vital signs accurate and taken at a resting state? Yes  Does the patient meet 2 or more of the SIRS criteria? Yes  Does the patient have a confirmed or suspected source of infection? Yes  MEWS guidelines implemented  Yes, red  Treat  MEWS Interventions Considered administering scheduled or prn medications/treatments as ordered  Take Vital Signs  Increase Vital Sign Frequency  Red: Q1hr x2, continue Q4hrs until patient remains green for 12hrs  Escalate  MEWS: Escalate Red: Discuss with charge nurse and notify provider. Consider notifying RRT. If remains red for 2 hours consider need for higher level of care  Notify: Charge Nurse/RN  Name of Charge Nurse/RN Notified Ginnie Smart  Provider Notification  Provider Name/Title Dr.Belkys Regalado  Date Provider Notified 02/03/23  Time Provider Notified 1102  Method of Notification Face-to-face  Notification Reason Change in status  Provider response At bedside  Date of Provider Response 02/03/23  Time of Provider Response 1102  Assess: SIRS CRITERIA  SIRS Temperature  0  SIRS Respirations  1  SIRS Pulse 1  SIRS WBC 1  SIRS Score Sum  3

## 2023-02-03 NOTE — Progress Notes (Signed)
Progress Note  Patient Name: [name]. Date of Encounter: 02/03/2023  Primary Cardiologist: Donato Schultz, MD   Subjective   Patient seen examined his bedside.  He is shivering and reports that he is cold.  Inpatient Medications    Scheduled Meds:  allopurinol  150 mg Oral q morning   atenolol  12.5 mg Oral Daily   ferrous sulfate  325 mg Oral BID   finasteride  5 mg Oral Daily   furosemide  40 mg Intravenous BID   potassium chloride  40 mEq Oral BID   rosuvastatin  10 mg Oral Daily   tamsulosin  0.4 mg Oral QPC supper   warfarin  5 mg Oral Once per day on Sunday Tuesday Wednesday Thursday Friday Saturday   And   [START ON 02/04/2023] warfarin  7.5 mg Oral Every Monday   Warfarin - Pharmacist Dosing Inpatient   Does not apply q1600   Continuous Infusions:  PRN Meds: acetaminophen **OR** acetaminophen, ondansetron **OR** ondansetron (ZOFRAN) IV, traZODone   Vital Signs    Vitals:   02/03/23 0215 02/03/23 0500 02/03/23 0509 02/03/23 0854  BP:   115/76 118/69  Pulse:   62 86  Resp: 19  (!) 24 (!) 24  Temp:   98.4 F (36.9 C) 98.6 F (37 C)  TempSrc:   Oral Oral  SpO2:   97% 98%  Weight:  78.6 kg    Height:        Intake/Output Summary (Last 24 hours) at 02/03/2023 0908 Last data filed at 02/03/2023 0110 Gross per 24 hour  Intake 120 ml  Output 600 ml  Net -480 ml   Filed Weights   02/02/23 0758 02/03/23 0500  Weight: 79.8 kg 78.6 kg    Telemetry    Atrial fibrillation rapid ventricular rate- Personally Reviewed  ECG    None- Personally Reviewed  Physical Exam    General: Comfortable Head: Atraumatic, normal size  Eyes: PEERLA, EOMI  Neck: Supple, normal JVD Cardiac: Normal S1, S2; RRR; no murmurs, rubs, or gallops Lungs: Diffuse and expiratory wheezing Abd: Soft, nontender, no hepatomegaly  Ext: warm, +2 Musculoskeletal: No deformities, BUE and BLE strength normal and equal Skin: Warm and dry, no rashes   Neuro: Alert and oriented  to person, place, time, and situation, CNII-XII grossly intact, no focal deficits  Psych: Normal mood and affect   Labs    Chemistry Recent Labs  Lab 02/02/23 0806 02/03/23 0402  NA 143 140  K 3.4* 4.7  CL 110 107  CO2 19* 21*  GLUCOSE 114* 184*  BUN 22 40*  CREATININE 1.19 1.47*  CALCIUM 8.8* 8.7*  PROT 7.1 6.9  ALBUMIN 3.6 3.4*  AST 29 27  ALT 16 16  ALKPHOS 69 40  BILITOT 1.3* 1.4*  GFRNONAA 60* 46*  ANIONGAP 14 12     Hematology Recent Labs  Lab 02/02/23 0806 02/03/23 0402  WBC 8.6 21.7*  RBC 3.41* 3.05*  HGB 10.3* 9.2*  HCT 32.5* 28.9*  MCV 95.3 94.8  MCH 30.2 30.2  MCHC 31.7 31.8  RDW 15.9* 16.3*  PLT 128* 114*    Cardiac EnzymesNo results for input(s): "TROPONINI" in the last 168 hours. No results for input(s): "TROPIPOC" in the last 168 hours.   BNP Recent Labs  Lab 02/02/23 0806  BNP 432.5*     DDimer No results for input(s): "DDIMER" in the last 168 hours.   Radiology    DG Chest Portable 1 View Result Date:  02/02/2023 CLINICAL DATA:  Shortness of breath. EXAM: PORTABLE CHEST 1 VIEW COMPARISON:  02/16/2022 FINDINGS: The cardio pericardial silhouette is enlarged. Diffuse interstitial opacity suggests edema. No focal lung consolidation. No substantial pleural effusion. Bones are diffusely demineralized. Telemetry leads overlie the chest. IMPRESSION: Enlargement of the cardiopericardial silhouette with diffuse interstitial opacity suggesting edema. Electronically Signed   By: Kennith Center M.D.   On: 02/02/2023 08:21    Cardiac Studies   Echo pending  Patient Profile     88 y.o. male admitted for acute exacerbation of heart failure.  Assessment & Plan    Acute on chronic suspicion of heart failure with preserved ejection fraction Severe pulmonary hypertension Status post valve repair: Aortic valve, mitral valve and tricuspid valve Paroxysmal atrial fibrillation/atrial flutter-rate is controlled currently CAD no angina  symptoms Elevated troponin-flat suspect this is in the setting of his exacerbation of heart failure fluid overload    So he was started on IV Lasix yesterday, clinically he does not seem to have had any improvement with this.  Unfortunately creatinine worsened.  Will keep him on the Lasix for now as he does have some bilateral crackles and lower extremity edema. Echocardiogram pending for today. INR at target.  Pharmacy helping [place] with this.  He is diffusely wheezing-he has not gotten any inhalers since he has been here, discussed with the bedside nurse to please give [place] that albuterol giving his diffuse wheezing and productive airway.  In terms of his elevated but flat troponin this could be in the setting of his volume overload.  No anginal symptoms at this time.  Further recommendations based on echo result and if clinical picture evolves.       For questions or updates, please contact [facility] Please consult [email or web address] for contact info under Cardiology/STEMI.      Signed, Nieves Barberi, DO  02/03/2023, 9:08 AM

## 2023-02-03 NOTE — Consult Note (Signed)
CC: n/v  HPI: Gerald Arey. is an 88 y.o. male who is here for CHF exacerbation.  He developed N/V today with possible aspiration event.  NG placed due to concern for SBO.  AXR being read as normal bowel gas pattern.  Past Medical History:  Diagnosis Date   Acquired dilation of ascending aorta and aortic root (HCC)    43mm aortic root and 41mm ascending aorta on echo 03/2020   Arthritis    Bladder stones    Borderline diabetes    BPH (benign prostatic hyperplasia)    CAD (coronary artery disease) 12/19/2020   Non-obstructive coronary artery disease at cath in 2019   Complication of anesthesia    problem with voiding after anesthesia,   Coronary artery disease    cardiologist-  dr Rockey Situ gerhart NP--- per cath 06-02-2010 non-obstructive cad pLAD 30-40%   CVA (cerebral vascular accident) (HCC) 10/24/2016   Diverticulosis of colon    Dysrhythmia    afib   Gout    Heart murmur    History of adenomatous polyp of colon    2002-- tubular adenoma   History of aortic insufficiency    severe -- s/p  AVR 08-03-2010   History of small bowel obstruction    02/ 2007 mechanical sbo s/p  surgical intervention;  partial sbo 09/ 2011 and 03-20-2011 resolved without surgical intervention   History of urinary retention    HTN (hypertension)    Other secondary pulmonary hypertension (HCC) 03/20/2022   TTE 03/19/22: EF 65-70, no RWMA, moderate LVH, normal RVSF, severe pulmonary hypertension (RVSP 60.4), severe BAE, normal structure and function of mitral valve repair (mean gradient 9), mild TR, trivial AI, normal structure and function of AVR (mean 15.4), moderate PI, aortic root and ascending aorta 40 mm, RAP 15   Peripheral neuropathy    Persistent atrial fibrillation (HCC)    Pre-diabetes    S/P aortic valve replacement with prosthetic valve 08/03/2010   tissue valve   S/P Maze operation for atrial fibrillation 01/30/2018   Complete bilateral atrial lesion set using bipolar  radiofrequency and cryothermy with clipping of LA appendage   S/P MVR (mitral valve repair) 01/30/2018   Complex valvuloplasty including artificial Gore-tex neochord placement x4 and Carbo medics Annuloflex ring annuloplasty, size 28   S/P patent foramen ovale closure 08/03/2010   at same time AVR   S/P tricuspid valve repair 01/30/2018   Using an MC3 Annuloplasty ring, size 28   Stroke (HCC)    Thrombocytopenia (HCC)    Tricuspid regurgitation     Past Surgical History:  Procedure Laterality Date   BIOPSY  09/07/2018   Procedure: BIOPSY;  Surgeon: Kathi Der, MD;  Location: [facility];  Service: Gastroenterology;;   CARDIAC CATHETERIZATION  06-02-2010  dr Anne Fu   non-obstructive cad- pLAD 30-40%/  normal LVSF/  severe AI   CARDIOVASCULAR STRESS TEST  04/12/2016   Low risk nuclear perfusion study w/ no significant reversible ischemia/  normal LV function and wall motion ,  stress ef 60%/  2mm inferior and lateral scooped ST-segment depression w/ exercise (may be repolarization abnormality), exercise capacity was moderately reduced   CATARACT EXTRACTION W/ INTRAOCULAR LENS  IMPLANT, BILATERAL  02/2010   CLIPPING OF ATRIAL APPENDAGE N/A 01/30/2018   Procedure: CLIPPING OF LEFT ATRIAL APPENDAGE USING ATRICLIP PRO2 ;  Surgeon: Purcell Nails, MD;  [name];  Service: Open Heart Surgery;  Laterality: N/A;   COLONOSCOPY WITH PROPOFOL N/A 10/21/2018  Procedure: COLONOSCOPY WITH PROPOFOL;  Surgeon: Kathi Der, MD;  Location: [facility];  Service: Gastroenterology;  Laterality: N/A;   CYSTOSCOPY WITH LITHOLAPAXY N/A 06/05/2016   Procedure: CYSTOSCOPY WITH LITHOLAPAXY and fulgarization of bladder neck;  Surgeon: Bjorn Pippin, MD;  Location: [facility];  Service: Urology;  Laterality: N/A;   ESOPHAGOGASTRODUODENOSCOPY (EGD) WITH PROPOFOL N/A 09/07/2018   Procedure: ESOPHAGOGASTRODUODENOSCOPY (EGD) WITH PROPOFOL;  Surgeon: Kathi Der, MD;  Location:  [facility];  Service: Gastroenterology;  Laterality: N/A;   EXPLORATORY LAPARTOMY /  CHOLECYSTECTOMY  02/28/2005   for Small  bowel obstruction (mechnical)   IR ANGIO EXTRACRAN SEL COM CAROTID INNOMINATE UNI L MOD SED  10/24/2016   IR ANGIO VERTEBRAL SEL SUBCLAVIAN INNOMINATE BILAT MOD SED  10/24/2016   IR PERCUTANEOUS ART THROMBECTOMY/INFUSION INTRACRANIAL INC DIAG ANGIO  10/24/2016   IR RADIOLOGIST EVAL & MGMT  12/05/2016   LEFT KNEE ARTHROSCOPY  2006   MAZE N/A 01/30/2018   Procedure: MAZE;  Surgeon: Purcell Nails, MD;  Location[facility] OR;  Service: Open Heart Surgery;  Laterality: N/A;   MITRAL VALVE REPAIR N/A 01/30/2018   Procedure: MITRAL VALVE REPAIR (MVR) USING CARBOMEDICS ANNULOFLEX SIZE 28;  Surgeon: Purcell Nails, MD;  Location: [facility] OR;  Service: Open Heart Surgery;  Laterality: N/A;   POLYPECTOMY  10/21/2018   Procedure: POLYPECTOMY;  Surgeon: Kathi Der, MD;  [facility];  Service: Gastroenterology;;   RADIOLOGY WITH ANESTHESIA N/A 10/24/2016   Procedure: RADIOLOGY WITH ANESTHESIA;  Surgeon: Julieanne Cotton, MD;  Location: [facility] OR;  Service: Radiology;  Laterality: N/A;   RIGHT FOOT SURGERY     RIGHT MINIATURE ANTERIOR THORACOTOMY FOR AORTIC VALVE REPLACEMENT AND CLOSURE PATENT FORAMEN OVALE  08-03-2010  DR Neldon Labella Magna-ease pericardial tissue valve (25mm)   RIGHT/LEFT HEART CATH AND CORONARY ANGIOGRAPHY N/A 10/14/2017   Procedure: RIGHT/LEFT HEART CATH AND CORONARY ANGIOGRAPHY;  Surgeon: Tonny Bollman, MD;  Location: [facility] INVASIVE CV LAB;  Service: Cardiovascular;  Laterality: N/A;   TEE WITHOUT CARDIOVERSION N/A 10/14/2017   Procedure: TRANSESOPHAGEAL ECHOCARDIOGRAM (TEE);  Surgeon: Wendall Stade, MD;  Location: [facility] ENDOSCOPY;  Service: Cardiovascular;  Laterality: N/A;   TOTAL HIP ARTHROPLASTY Left 08/02/2020   Procedure: TOTAL HIP ARTHROPLASTY ANTERIOR APPROACH;  Surgeon: Durene Romans, MD;  Location: [facility];  Service: Orthopedics;  Laterality:  Left;   TOTAL KNEE ARTHROPLASTY Left 09/19/2021   Procedure: TOTAL KNEE ARTHROPLASTY;  Surgeon: Durene Romans, MD;  Location: [facility];  Service: Orthopedics;  Laterality: Left;   TOTAL KNEE ARTHROPLASTY Right 08/09/2022   Procedure: TOTAL KNEE ARTHROPLASTY;  Surgeon: Durene Romans, MD;  Location: [facility];  Service: Orthopedics;  Laterality: Right;  89   TRANSTHORACIC ECHOCARDIOGRAM  05/30/2016  dr skains   moderate  LVH ef 60-65%/  bioprothesis aortic valve present ,normal grandient and no AI /  mild MV calcification , moderate MR /  mild PR/ moderate TR/  PASP 31mmHg/ (RA denisty was identified 04-27-2016 echo) and is seen again today, this is likely a promient eustacian ridge, atrium is normal size   TRICUSPID VALVE REPLACEMENT N/A 01/30/2018   Procedure: TRICUSPID VALVE REPAIR USING MC3 SIZE 28;  Surgeon: Purcell Nails, MD;  Location: [facility] OR;  Service: Open Heart Surgery;  Laterality: N/A;    Family History  Problem Relation Age of Onset   Heart disease Mother    Brain cancer Father     Social:  reports that he has never smoked. He has never used smokeless tobacco.  He reports current alcohol use. He reports that he does not use drugs.  Allergies:  Allergies  Allergen Reactions   Sulfa Antibiotics Other (See Comments)    Granulocytosis   Sulfamethoxazole-Trimethoprim     Other Reaction(s): Unknown   Zestril [Lisinopril] Cough    Medications: I have reviewed the patient's current medications.  Results for orders placed or performed during the hospital encounter of 02/02/23 (from the past 48 hours)  Resp panel by RT-PCR (RSV, Flu A&B, Covid) Anterior Nasal Swab     Status: None   Collection Time: 02/02/23  7:30 AM   Specimen: Anterior Nasal Swab  Result Value Ref Range   SARS Coronavirus 2 by RT PCR NEGATIVE NEGATIVE    Comment: (NOTE) SARS-CoV-2 target nucleic acids are NOT DETECTED.  The SARS-CoV-2 RNA is generally detectable in upper respiratory specimens during the acute  phase of infection. The lowest concentration of SARS-CoV-2 viral copies this assay can detect is 138 copies/mL. A negative result does not preclude SARS-Cov-2 infection and should not be used as the sole basis for treatment or other patient management decisions. A negative result may occur with  improper specimen collection/handling, submission of specimen other than nasopharyngeal swab, presence of viral mutation(s) within the areas targeted by this assay, and inadequate number of viral copies(<138 copies/mL). A negative result must be combined with clinical observations, patient history, and epidemiological information. The expected result is Negative.  Fact Sheet for Patients:  BloggerCourse.com  Fact Sheet for Healthcare Providers:  SeriousBroker.it  This test is no t yet approved or cleared by the [place] FDA and  has been authorized for detection and/or diagnosis of SARS-CoV-2 by FDA under an Emergency Use Authorization (EUA). This EUA will remain  in effect (meaning this test can be used) for the duration of the COVID-19 declaration under Section 564(b)(1) of the Act, 21 U.S.C.section 360bbb-3(b)(1), unless the authorization is terminated  or revoked sooner.       Influenza A by PCR NEGATIVE NEGATIVE   Influenza B by PCR NEGATIVE NEGATIVE    Comment: (NOTE) The Xpert Xpress SARS-CoV-2/FLU/RSV plus assay is intended as an aid in the diagnosis of influenza from Nasopharyngeal swab specimens and should not be used as a sole basis for treatment. Nasal washings and aspirates are unacceptable for Xpert Xpress SARS-CoV-2/FLU/RSV testing.  Fact Sheet for Patients: BloggerCourse.com  Fact Sheet for Healthcare Providers: SeriousBroker.it  This test is not yet approved or cleared by the [place] FDA and has been authorized for detection and/or diagnosis of SARS-CoV-2  by FDA under an Emergency Use Authorization (EUA). This EUA will remain in effect (meaning this test can be used) for the duration of the COVID-19 declaration under Section 564(b)(1) of the Act, 21 U.S.C. section 360bbb-3(b)(1), unless the authorization is terminated or revoked.     Resp Syncytial Virus by PCR NEGATIVE NEGATIVE    Comment: (NOTE) Fact Sheet for Patients: BloggerCourse.com  Fact Sheet for Healthcare Providers: SeriousBroker.it  This test is not yet approved or cleared by the [place] FDA and has been authorized for detection and/or diagnosis of SARS-CoV-2 by FDA under an Emergency Use Authorization (EUA). This EUA will remain in effect (meaning this test can be used) for the duration of the COVID-19 declaration under Section 564(b)(1) of the Act, 21 U.S.C. section 360bbb-3(b)(1), unless the authorization is terminated or revoked.  Performed at [facility], [address], [place], [place] [address]   CBC with Differential     Status:  Abnormal   Collection Time: 02/02/23  8:06 AM  Result Value Ref Range   WBC 8.6 4.0 - 10.5 K/uL   RBC 3.41 (L) 4.22 - 5.81 MIL/uL   Hemoglobin 10.3 (L) 13.0 - 17.0 g/dL   HCT 16.1 (L) 09.6 - 04.5 %   MCV 95.3 80.0 - 100.0 fL   MCH 30.2 26.0 - 34.0 pg   MCHC 31.7 30.0 - 36.0 g/dL   RDW 40.9 (H) 81.1 - 91.4 %   Platelets 128 (L) 150 - 400 K/uL   nRBC 0.0 0.0 - 0.2 %   Neutrophils Relative % 94 %   Neutro Abs 8.1 (H) 1.7 - 7.7 K/uL   Lymphocytes Relative 4 %   Lymphs Abs 0.3 (L) 0.7 - 4.0 K/uL   Monocytes Relative 1 %   Monocytes Absolute 0.1 0.1 - 1.0 K/uL   Eosinophils Relative 0 %   Eosinophils Absolute 0.0 0.0 - 0.5 K/uL   Basophils Relative 0 %   Basophils Absolute 0.0 0.0 - 0.1 K/uL   Immature Granulocytes 1 %   Abs Immature Granulocytes 0.06 0.00 - 0.07 K/uL    Comment: Performed at [facility], [address],  [place], [place] [address]  Comprehensive metabolic panel     Status: Abnormal   Collection Time: 02/02/23  8:06 AM  Result Value Ref Range   Sodium 143 135 - 145 mmol/L   Potassium 3.4 (L) 3.5 - 5.1 mmol/L   Chloride 110 98 - 111 mmol/L   CO2 19 (L) 22 - 32 mmol/L   Glucose, Bld 114 (H) 70 - 99 mg/dL    Comment: Glucose reference range applies only to samples taken after fasting for at least 8 hours.   BUN 22 8 - 23 mg/dL   Creatinine, Ser 6.21 0.61 - 1.24 mg/dL   Calcium 8.8 (L) 8.9 - 10.3 mg/dL   Total Protein 7.1 6.5 - 8.1 g/dL   Albumin 3.6 3.5 - 5.0 g/dL   AST 29 15 - 41 U/L   ALT 16 0 - 44 U/L   Alkaline Phosphatase 69 38 - 126 U/L   Total Bilirubin 1.3 (H) 0.0 - 1.2 mg/dL   GFR, Estimated 60 (L) >60 mL/min    Comment: (NOTE) Calculated using the CKD-EPI Creatinine Equation (2021)    Anion gap 14 5 - 15    Comment: Performed at [facility], [address], [place], [place] [address]  Brain natriuretic peptide     Status: Abnormal   Collection Time: 02/02/23  8:06 AM  Result Value Ref Range   B Natriuretic Peptide 432.5 (H) 0.0 - 100.0 pg/mL    Comment: Performed at [facility], [address], [place], [place] [address]  Troponin I (High Sensitivity)     Status: Abnormal   Collection Time: 02/02/23  8:06 AM  Result Value Ref Range   Troponin I (High Sensitivity) 34 (H) <18 ng/L    Comment: (NOTE) Elevated high sensitivity troponin I (hsTnI) values and significant  changes across serial measurements may suggest ACS but many other  chronic and acute conditions are known to elevate hsTnI results.  Refer to the "Links" section for chest pain algorithms and additional  guidance. Performed at [facility], [address] [place], [place] [address]   Protime-INR     Status: Abnormal   Collection Time: 02/02/23  8:06 AM  Result Value Ref Range   Prothrombin Time 27.7 (H) 11.4 - 15.2  seconds   INR 2.6 (H) 0.8 - 1.2     Comment: (NOTE) INR goal varies based on device and disease states. Performed at [facility], [address] [place], [place] [address]   Troponin I (High Sensitivity)     Status: Abnormal   Collection Time: 02/02/23  9:50 AM  Result Value Ref Range   Troponin I (High Sensitivity) 59 (H) <18 ng/L    Comment: DELTA CHECK NOTED (NOTE) Elevated high sensitivity troponin I (hsTnI) values and significant  changes across serial measurements may suggest ACS but many other  chronic and acute conditions are known to elevate hsTnI results.  Refer to the "Links" section for chest pain algorithms and additional  guidance. Performed at [facility], [address] [place], [place] [address]   Magnesium     Status: None   Collection Time: 02/03/23  4:02 AM  Result Value Ref Range   Magnesium 2.3 1.7 - 2.4 mg/dL    Comment: Performed at [facility], [address], [place], [place] [address]  CBC     Status: Abnormal   Collection Time: 02/03/23  4:02 AM  Result Value Ref Range   WBC 21.7 (H) 4.0 - 10.5 K/uL   RBC 3.05 (L) 4.22 - 5.81 MIL/uL   Hemoglobin 9.2 (L) 13.0 - 17.0 g/dL   HCT 29.5 (L) 62.1 - 30.8 %   MCV 94.8 80.0 - 100.0 fL   MCH 30.2 26.0 - 34.0 pg   MCHC 31.8 30.0 - 36.0 g/dL   RDW 65.7 (H) 84.6 - 96.2 %   Platelets 114 (L) 150 - 400 K/uL   nRBC 0.0 0.0 - 0.2 %    Comment: Performed at [facility], [address], [place], [place] [address]  Comprehensive metabolic panel     Status: Abnormal   Collection Time: 02/03/23  4:02 AM  Result Value Ref Range   Sodium 140 135 - 145 mmol/L   Potassium 4.7 3.5 - 5.1 mmol/L   Chloride 107 98 - 111 mmol/L   CO2 21 (L) 22 - 32 mmol/L   Glucose, Bld 184 (H) 70 - 99 mg/dL    Comment: Glucose reference range applies only to samples taken after fasting for at least 8 hours.   BUN 40 (H) 8 - 23 mg/dL   Creatinine, Ser 1.32 (H) 0.61 - 1.24 mg/dL   Calcium 8.7  (L) 8.9 - 10.3 mg/dL   Total Protein 6.9 6.5 - 8.1 g/dL   Albumin 3.4 (L) 3.5 - 5.0 g/dL   AST 27 15 - 41 U/L   ALT 16 0 - 44 U/L   Alkaline Phosphatase 40 38 - 126 U/L   Total Bilirubin 1.4 (H) 0.0 - 1.2 mg/dL   GFR, Estimated 46 (L) >60 mL/min    Comment: (NOTE) Calculated using the CKD-EPI Creatinine Equation (2021)    Anion gap 12 5 - 15    Comment: Performed at [facility], [address] [place], [place] [address]  Protime-INR     Status: Abnormal   Collection Time: 02/03/23  4:02 AM  Result Value Ref Range   Prothrombin Time 27.2 (H) 11.4 - 15.2 seconds   INR 2.5 (H) 0.8 - 1.2    Comment: (NOTE) INR goal varies based on device and disease states. Performed at [facility] [place], [address], [place], [place] [address]   Procalcitonin     Status: None   Collection  Time: 02/03/23  8:03 AM  Result Value Ref Range   Procalcitonin 42.01 ng/mL    Comment:        Interpretation: PCT >= 10 ng/mL: Important systemic inflammatory response, almost exclusively due to severe bacterial sepsis or septic shock. (NOTE)       Sepsis PCT Algorithm           Lower Respiratory Tract                                      Infection PCT Algorithm    ----------------------------     ----------------------------         PCT < 0.25 ng/mL                PCT < 0.10 ng/mL          Strongly encourage             Strongly discourage   discontinuation of antibiotics    initiation of antibiotics    ----------------------------     -----------------------------       PCT 0.25 - 0.50 ng/mL            PCT 0.10 - 0.25 ng/mL               OR       >80% decrease in PCT            Discourage initiation of                                            antibiotics      Encourage discontinuation           of antibiotics    ----------------------------     -----------------------------         PCT >= 0.50 ng/mL              PCT 0.26 - 0.50 ng/mL                AND        <80% decrease in PCT             Encourage initiation of                                             antibiotics       Encourage continuation           of antibiotics    ----------------------------     -----------------------------        PCT >= 0.50 ng/mL                  PCT > 0.50 ng/mL               AND         increase in PCT                  Strongly encourage                                      initiation of antibiotics    Strongly encourage escalation  of antibiotics                                     -----------------------------                                           PCT <= 0.25 ng/mL                                                 OR                                        > 80% decrease in PCT                                      Discontinue / Do not initiate                                             antibiotics  Performed at [facility], [address], [place], [place] [address]   Urinalysis, Routine w reflex microscopic -Urine, Clean Catch     Status: Abnormal   Collection Time: 02/03/23 11:25 AM  Result Value Ref Range   Color, Urine YELLOW YELLOW   APPearance CLOUDY (A) CLEAR   Specific Gravity, Urine 1.010 1.005 - 1.030   pH 6.0 5.0 - 8.0   Glucose, UA NEGATIVE NEGATIVE mg/dL   Hgb urine dipstick LARGE (A) NEGATIVE   Bilirubin Urine NEGATIVE NEGATIVE   Ketones, ur NEGATIVE NEGATIVE mg/dL   Protein, ur NEGATIVE NEGATIVE mg/dL   Nitrite NEGATIVE NEGATIVE   Leukocytes,Ua LARGE (A) NEGATIVE   RBC / HPF 11-20 0 - 5 RBC/hpf   WBC, UA >50 0 - 5 WBC/hpf   Bacteria, UA MANY (A) NONE SEEN   Squamous Epithelial / HPF 0-5 0 - 5 /HPF   Mucus PRESENT    Budding Yeast PRESENT     Comment: Performed at [facility], [address], [place], [place] [address]    ECHOCARDIOGRAM COMPLETE Result Date: 02/03/2023    ECHOCARDIOGRAM REPORT   Patient Name:   Gerald Badie. Date of Exam: 02/03/2023 Medical Rec #:   [id number]             Height:       72.0 in Accession #:    [id number]            Weight:       173.3 lb Date of Birth:  12/04/1935             BSA:          2.005 m Patient Age:    87 years              BP:           91/64 mmHg Patient Gender: M  HR:           76 bpm. Exam Location:  Inpatient Procedure: 2D Echo, Cardiac Doppler, Color Doppler and Intracardiac            Opacification Agent Indications:    TIA  History:        Patient has prior history of Echocardiogram examinations, most                 recent 03/19/2022. Previous Myocardial Infarction and CAD, COPD,                 Arrythmias:Atrial Fibrillation; Risk Factors:Hypertension.                 Aortic Valve: bioprosthetic valve is present in the aortic                 position.                 Mitral Valve: prosthetic annuloplasty ring valve is present in                 the mitral position.  Sonographer:    Webb Laws Referring Phys: [id number] DAVID MANUEL ORTIZ IMPRESSIONS  1. S/P MV repair with mean gradient 8 mmHg and no MR; s/p AVR with mean gradient 14 mmHg and no AI; s/p TV repair with mild TR.  2. Left ventricular ejection fraction, by estimation, is 60 to 65%. The left ventricle has normal function. The left ventricle has no regional wall motion abnormalities. There is moderate left ventricular hypertrophy. Left ventricular diastolic parameters are indeterminate. There is the interventricular septum is flattened in systole and diastole, consistent with right ventricular pressure and volume overload.  3. Right ventricular systolic function is normal. The right ventricular size is mildly enlarged. There is severely elevated pulmonary artery systolic pressure.  4. Left atrial size was severely dilated.  5. Right atrial size was severely dilated.  6. The mitral valve has been repaired/replaced. No evidence of mitral valve regurgitation. No evidence of mitral stenosis. There is a prosthetic annuloplasty ring present in the  mitral position.  7. The tricuspid valve is has been repaired/replaced. The tricuspid valve is status post repair with an annuloplasty ring.  8. The aortic valve has been repaired/replaced. Aortic valve regurgitation is not visualized. No aortic stenosis is present. There is a bioprosthetic valve present in the aortic position.  9. The inferior vena cava is dilated in size with <50% respiratory variability, suggesting right atrial pressure of 15 mmHg. FINDINGS  Left Ventricle: Left ventricular ejection fraction, by estimation, is 60 to 65%. The left ventricle has normal function. The left ventricle has no regional wall motion abnormalities. Definity contrast agent was given IV to delineate the left ventricular  endocardial borders. The left ventricular internal cavity size was normal in size. There is moderate left ventricular hypertrophy. The interventricular septum is flattened in systole and diastole, consistent with right ventricular pressure and volume overload. Left ventricular diastolic parameters are indeterminate. Right Ventricle: The right ventricular size is mildly enlarged. Right ventricular systolic function is normal. There is severely elevated pulmonary artery systolic pressure. The tricuspid regurgitant velocity is 3.39 m/s, and with an assumed right atrial  pressure of 15 mmHg, the estimated right ventricular systolic pressure is 61.0 mmHg. Left Atrium: Left atrial size was severely dilated. Right Atrium: Right atrial size was severely dilated. Pericardium: There is no evidence of pericardial effusion. Mitral Valve: The mitral valve has been repaired/replaced. No evidence of mitral  valve regurgitation. There is a prosthetic annuloplasty ring present in the mitral position. No evidence of mitral valve stenosis. MV peak gradient, 22.5 mmHg. The mean mitral valve gradient is 8.0 mmHg. Tricuspid Valve: The tricuspid valve is has been repaired/replaced. Tricuspid valve regurgitation is mild . No evidence  of tricuspid stenosis. The tricuspid valve is status post repair with an annuloplasty ring. Aortic Valve: The aortic valve has been repaired/replaced. Aortic valve regurgitation is not visualized. No aortic stenosis is present. Aortic valve mean gradient measures 14.2 mmHg. Aortic valve peak gradient measures 17.8 mmHg. Aortic valve area, by VTI measures 1.60 cm. There is a bioprosthetic valve present in the aortic position. Pulmonic Valve: The pulmonic valve was normal in structure. Pulmonic valve regurgitation is mild. No evidence of pulmonic stenosis. Aorta: The aortic root is normal in size and structure. Venous: The inferior vena cava is dilated in size with less than 50% respiratory variability, suggesting right atrial pressure of 15 mmHg. IAS/Shunts: No atrial level shunt detected by color flow Doppler. Additional Comments: S/P MV repair with mean gradient 8 mmHg and no MR; s/p AVR with mean gradient 14 mmHg and no AI; s/p TV repair with mild TR.  LEFT VENTRICLE PLAX 2D LVIDd:         4.50 cm      Diastology LVIDs:         3.50 cm      LV e' medial:    3.37 cm/s LV PW:         1.20 cm      LV E/e' medial:  59.6 LV IVS:        1.40 cm      LV e' lateral:   7.40 cm/s LVOT diam:     2.10 cm      LV E/e' lateral: 27.2 LV SV:         72 LV SV Index:   36 LVOT Area:     3.46 cm  LV Volumes (MOD) LV vol d, MOD A2C: 120.0 ml LV vol d, MOD A4C: 98.9 ml LV vol s, MOD A2C: 55.1 ml LV vol s, MOD A4C: 41.1 ml LV SV MOD A2C:     64.9 ml LV SV MOD A4C:     98.9 ml LV SV MOD BP:      60.2 ml RIGHT VENTRICLE             IVC RV Basal diam:  4.65 cm     IVC diam: 3.20 cm RV Mid diam:    4.20 cm RV S prime:     10.30 cm/s TAPSE (M-mode): 1.6 cm LEFT ATRIUM              Index        RIGHT ATRIUM           Index LA diam:        4.70 cm  2.34 cm/m   RA Area:     21.10 cm LA Vol (A2C):   141.0 ml 70.34 ml/m  RA Volume:   54.90 ml  27.39 ml/m LA Vol (A4C):   98.7 ml  49.24 ml/m LA Biplane Vol: 120.0 ml 59.86 ml/m  AORTIC  VALVE                     PULMONIC VALVE AV Area (Vmax):    1.99 cm      PR End Diast Vel: 1.85 msec AV Area (Vmean):   1.63 cm AV  Area (VTI):     1.60 cm AV Vmax:           211.12 cm/s AV Vmean:          176.800 cm/s AV VTI:            0.452 m AV Peak Grad:      17.8 mmHg AV Mean Grad:      14.2 mmHg LVOT Vmax:         121.00 cm/s LVOT Vmean:        83.000 cm/s LVOT VTI:          0.209 m LVOT/AV VTI ratio: 0.46  AORTA Ao Root diam: 3.30 cm Ao Asc diam:  3.50 cm MITRAL VALVE                TRICUSPID VALVE MV Area (PHT): 1.86 cm     TR Peak grad:   46.0 mmHg MV Area VTI:   1.10 cm     TR Vmax:        339.00 cm/s MV Peak grad:  22.5 mmHg MV Mean grad:  8.0 mmHg     SHUNTS MV Vmax:       2.37 m/s     Systemic VTI:  0.21 m MV Vmean:      132.0 cm/s   Systemic Diam: 2.10 cm MV Decel Time: 408 msec MV E velocity: 201.00 cm/s MV A velocity: 55.60 cm/s MV E/A ratio:  3.62 Olga Millers MD Electronically signed by Olga Millers MD Signature Date/Time: 02/03/2023/3:57:16 PM    Final    DG Abd 1 View Result Date: 02/03/2023 CLINICAL DATA:  Nasogastric tube placement. EXAM: ABDOMEN - 1 VIEW COMPARISON:  Earlier today FINDINGS: The enteric tube has been advanced, the tip and side port are below the diaphragm in the stomach. No bowel dilatation in the upper abdomen. Right upper quadrant surgical clips. IMPRESSION: Enteric tube tip and side port below the diaphragm in the stomach. Electronically Signed   By: Narda Rutherford M.D.   On: 02/03/2023 15:50   DG Abd 1 View Result Date: 02/03/2023 CLINICAL DATA:  Nausea and vomiting EXAM: ABDOMEN - 1 VIEW; PORTABLE ABDOMEN - 1 VIEW COMPARISON:  None Available. FINDINGS: Sequential images of the upper abdomen demonstrate placement of an esophagogastric tube, tip below the diaphragm, side port above the gastroesophageal junction. Nonobstructive pattern of included bowel gas. No free air on supine radiographs. Cardiomegaly status post median sternotomy with valvular prosthesis  and left atrial appendage clip. IMPRESSION: 1. Sequential images of the upper abdomen demonstrate placement of an esophagogastric tube, tip below the diaphragm, side port above the gastroesophageal junction. Recommend advancement. 2. Nonobstructive pattern of included bowel gas. Electronically Signed   By: Jearld Lesch M.D.   On: 02/03/2023 13:22   DG Abd Portable 1V Result Date: 02/03/2023 CLINICAL DATA:  Nausea and vomiting EXAM: ABDOMEN - 1 VIEW; PORTABLE ABDOMEN - 1 VIEW COMPARISON:  None Available. FINDINGS: Sequential images of the upper abdomen demonstrate placement of an esophagogastric tube, tip below the diaphragm, side port above the gastroesophageal junction. Nonobstructive pattern of included bowel gas. No free air on supine radiographs. Cardiomegaly status post median sternotomy with valvular prosthesis and left atrial appendage clip. IMPRESSION: 1. Sequential images of the upper abdomen demonstrate placement of an esophagogastric tube, tip below the diaphragm, side port above the gastroesophageal junction. Recommend advancement. 2. Nonobstructive pattern of included bowel gas. Electronically Signed   By: Jearld Lesch M.D.   On: 02/03/2023 13:22  DG CHEST PORT 1 VIEW Result Date: 02/03/2023 CLINICAL DATA:  Hypoxia, status post aortic valve replacement EXAM: PORTABLE CHEST 1 VIEW COMPARISON:  02/02/2023 FINDINGS: Cardiomegaly status post median sternotomy with aortic valve prosthesis and left atrial appendage clip. Similar retrocardiac atelectasis. Mild diffuse interstitial opacity. No acute osseous findings. IMPRESSION: 1. Cardiomegaly status post median sternotomy with aortic valve prosthesis and left atrial appendage clip. 2. Similar retrocardiac atelectasis. Mild diffuse interstitial opacity, consistent with edema. Electronically Signed   By: Jearld Lesch M.D.   On: 02/03/2023 12:22   DG Chest Portable 1 View Result Date: 02/02/2023 CLINICAL DATA:  Shortness of breath. EXAM: PORTABLE  CHEST 1 VIEW COMPARISON:  02/16/2022 FINDINGS: The cardio pericardial silhouette is enlarged. Diffuse interstitial opacity suggests edema. No focal lung consolidation. No substantial pleural effusion. Bones are diffusely demineralized. Telemetry leads overlie the chest. IMPRESSION: Enlargement of the cardiopericardial silhouette with diffuse interstitial opacity suggesting edema. Electronically Signed   By: Kennith Center M.D.   On: 02/02/2023 08:21    ROS - all of the below systems have been reviewed with the patient and positives are indicated with bold text General: chills, fever or night sweats Eyes: blurry vision or double vision ENT: epistaxis or sore throat Allergy/Immunology: itchy/watery eyes or nasal congestion Hematologic/Lymphatic: bleeding problems, blood clots or swollen lymph nodes Endocrine: temperature intolerance or unexpected weight changes Resp: cough, shortness of breath, or wheezing CV: chest pain or dyspnea on exertion GI: as per HPI GU: dysuria, trouble voiding, or hematuria MSK: joint pain or joint stiffness Neuro: TIA or stroke symptoms Derm: pruritus and skin lesion changes Psych: anxiety and depression  PE Blood pressure (!) 95/59, pulse 73, temperature 99 F (37.2 C), temperature source Oral, resp. rate (!) 34, height 6' (1.829 m), weight 78.6 kg, SpO2 99%. Constitutional: NAD; conversant; no deformities Eyes: Moist conjunctiva; no lid lag; anicteric; PERRL Neck: Trachea midline; no thyromegaly Lungs: Normal respiratory effort; no tactile fremitus CV: RRR; no palpable thrills; no pitting edema GI: Abd soft, distended, no TTP; no palpable hepatosplenomegaly MSK: Normal range of motion of extremities; no clubbing/cyanosis Psychiatric: Appropriate affect; alert and oriented x3 Lymphatic: No palpable cervical or axillary lymphadenopathy  Results for orders placed or performed during the hospital encounter of 02/02/23 (from the past 48 hours)  Resp panel by  RT-PCR (RSV, Flu A&B, Covid) Anterior Nasal Swab     Status: None   Collection Time: 02/02/23  7:30 AM   Specimen: Anterior Nasal Swab  Result Value Ref Range   SARS Coronavirus 2 by RT PCR NEGATIVE NEGATIVE    Comment: (NOTE) SARS-CoV-2 target nucleic acids are NOT DETECTED.  The SARS-CoV-2 RNA is generally detectable in upper respiratory specimens during the acute phase of infection. The lowest concentration of SARS-CoV-2 viral copies this assay can detect is 138 copies/mL. A negative result does not preclude SARS-Cov-2 infection and should not be used as the sole basis for treatment or other patient management decisions. A negative result may occur with  improper specimen collection/handling, submission of specimen other than nasopharyngeal swab, presence of viral mutation(s) within the areas targeted by this assay, and inadequate number of viral copies(<138 copies/mL). A negative result must be combined with clinical observations, patient history, and epidemiological information. The expected result is Negative.  Fact Sheet for Patients:  BloggerCourse.com  Fact Sheet for Healthcare Providers:  SeriousBroker.it  This test is no t yet approved or cleared by the [place] FDA and  has been authorized for detection and/or diagnosis of  SARS-CoV-2 by FDA under an Emergency Use Authorization (EUA). This EUA will remain  in effect (meaning this test can be used) for the duration of the COVID-19 declaration under Section 564(b)(1) of the Act, 21 U.S.C.section 360bbb-3(b)(1), unless the authorization is terminated  or revoked sooner.       Influenza A by PCR NEGATIVE NEGATIVE   Influenza B by PCR NEGATIVE NEGATIVE    Comment: (NOTE) The Xpert Xpress SARS-CoV-2/FLU/RSV plus assay is intended as an aid in the diagnosis of influenza from Nasopharyngeal swab specimens and should not be used as a sole basis for treatment. Nasal  washings and aspirates are unacceptable for Xpert Xpress SARS-CoV-2/FLU/RSV testing.  Fact Sheet for Patients: BloggerCourse.com  Fact Sheet for Healthcare Providers: SeriousBroker.it  This test is not yet approved or cleared by the [place] FDA and has been authorized for detection and/or diagnosis of SARS-CoV-2 by FDA under an Emergency Use Authorization (EUA). This EUA will remain in effect (meaning this test can be used) for the duration of the COVID-19 declaration under Section 564(b)(1) of the Act, 21 U.S.C. section 360bbb-3(b)(1), unless the authorization is terminated or revoked.     Resp Syncytial Virus by PCR NEGATIVE NEGATIVE    Comment: (NOTE) Fact Sheet for Patients: BloggerCourse.com  Fact Sheet for Healthcare Providers: SeriousBroker.it  This test is not yet approved or cleared by the [place] FDA and has been authorized for detection and/or diagnosis of SARS-CoV-2 by FDA under an Emergency Use Authorization (EUA). This EUA will remain in effect (meaning this test can be used) for the duration of the COVID-19 declaration under Section 564(b)(1) of the Act, 21 U.S.C. section 360bbb-3(b)(1), unless the authorization is terminated or revoked.  Performed at [facility], [address] [place], [place] [address]   CBC with Differential     Status: Abnormal   Collection Time: 02/02/23  8:06 AM  Result Value Ref Range   WBC 8.6 4.0 - 10.5 K/uL   RBC 3.41 (L) 4.22 - 5.81 MIL/uL   Hemoglobin 10.3 (L) 13.0 - 17.0 g/dL   HCT 60.4 (L) 54.0 - 98.1 %   MCV 95.3 80.0 - 100.0 fL   MCH 30.2 26.0 - 34.0 pg   MCHC 31.7 30.0 - 36.0 g/dL   RDW 19.1 (H) 47.8 - 29.5 %   Platelets 128 (L) 150 - 400 K/uL   nRBC 0.0 0.0 - 0.2 %   Neutrophils Relative % 94 %   Neutro Abs 8.1 (H) 1.7 - 7.7 K/uL   Lymphocytes Relative 4 %   Lymphs Abs 0.3 (L) 0.7 -  4.0 K/uL   Monocytes Relative 1 %   Monocytes Absolute 0.1 0.1 - 1.0 K/uL   Eosinophils Relative 0 %   Eosinophils Absolute 0.0 0.0 - 0.5 K/uL   Basophils Relative 0 %   Basophils Absolute 0.0 0.0 - 0.1 K/uL   Immature Granulocytes 1 %   Abs Immature Granulocytes 0.06 0.00 - 0.07 K/uL    Comment: Performed at [facility], [address], [place], [place] [address]  Comprehensive metabolic panel     Status: Abnormal   Collection Time: 02/02/23  8:06 AM  Result Value Ref Range   Sodium 143 135 - 145 mmol/L   Potassium 3.4 (L) 3.5 - 5.1 mmol/L   Chloride 110 98 - 111 mmol/L   CO2 19 (L) 22 - 32 mmol/L   Glucose, Bld 114 (H) 70 - 99 mg/dL    Comment: Glucose reference  range applies only to samples taken after fasting for at least 8 hours.   BUN 22 8 - 23 mg/dL   Creatinine, Ser 1.61 0.61 - 1.24 mg/dL   Calcium 8.8 (L) 8.9 - 10.3 mg/dL   Total Protein 7.1 6.5 - 8.1 g/dL   Albumin 3.6 3.5 - 5.0 g/dL   AST 29 15 - 41 U/L   ALT 16 0 - 44 U/L   Alkaline Phosphatase 69 38 - 126 U/L   Total Bilirubin 1.3 (H) 0.0 - 1.2 mg/dL   GFR, Estimated 60 (L) >60 mL/min    Comment: (NOTE) Calculated using the CKD-EPI Creatinine Equation (2021)    Anion gap 14 5 - 15    Comment: Performed at [facility], [address], [place], [place] [address]  Brain natriuretic peptide     Status: Abnormal   Collection Time: 02/02/23  8:06 AM  Result Value Ref Range   B Natriuretic Peptide 432.5 (H) 0.0 - 100.0 pg/mL    Comment: Performed at [facility], [address], [place], [place] [address]  Troponin I (High Sensitivity)     Status: Abnormal   Collection Time: 02/02/23  8:06 AM  Result Value Ref Range   Troponin I (High Sensitivity) 34 (H) <18 ng/L    Comment: (NOTE) Elevated high sensitivity troponin I (hsTnI) values and significant  changes across serial measurements may suggest ACS but many other  chronic and acute conditions are known to  elevate hsTnI results.  Refer to the "Links" section for chest pain algorithms and additional  guidance. Performed at [facility], [address] [place], [place] [address]   Protime-INR     Status: Abnormal   Collection Time: 02/02/23  8:06 AM  Result Value Ref Range   Prothrombin Time 27.7 (H) 11.4 - 15.2 seconds   INR 2.6 (H) 0.8 - 1.2    Comment: (NOTE) INR goal varies based on device and disease states. Performed at [facility], [address], [place], [place] [address]   Troponin I (High Sensitivity)     Status: Abnormal   Collection Time: 02/02/23  9:50 AM  Result Value Ref Range   Troponin I (High Sensitivity) 59 (H) <18 ng/L    Comment: DELTA CHECK NOTED (NOTE) Elevated high sensitivity troponin I (hsTnI) values and significant  changes across serial measurements may suggest ACS but many other  chronic and acute conditions are known to elevate hsTnI results.  Refer to the "Links" section for chest pain algorithms and additional  guidance. Performed at [facility], [address] [place], [place] [address]   Magnesium     Status: None   Collection Time: 02/03/23  4:02 AM  Result Value Ref Range   Magnesium 2.3 1.7 - 2.4 mg/dL    Comment: Performed at [facility], [address], [place], [place] [address]  CBC     Status: Abnormal   Collection Time: 02/03/23  4:02 AM  Result Value Ref Range   WBC 21.7 (H) 4.0 - 10.5 K/uL   RBC 3.05 (L) 4.22 - 5.81 MIL/uL   Hemoglobin 9.2 (L) 13.0 - 17.0 g/dL   HCT 46.9 (L) 62.9 - 52.8 %   MCV 94.8 80.0 - 100.0 fL   MCH 30.2 26.0 - 34.0 pg   MCHC 31.8 30.0 - 36.0 g/dL   RDW 41.3 (H) 24.4 - 01.0 %   Platelets 114 (L) 150 - 400 K/uL  nRBC 0.0 0.0 - 0.2 %    Comment: Performed at [facility], [address] [place], [place] [address]  Comprehensive metabolic panel     Status: Abnormal   Collection Time: 02/03/23  4:02 AM   Result Value Ref Range   Sodium 140 135 - 145 mmol/L   Potassium 4.7 3.5 - 5.1 mmol/L   Chloride 107 98 - 111 mmol/L   CO2 21 (L) 22 - 32 mmol/L   Glucose, Bld 184 (H) 70 - 99 mg/dL    Comment: Glucose reference range applies only to samples taken after fasting for at least 8 hours.   BUN 40 (H) 8 - 23 mg/dL   Creatinine, Ser 6.04 (H) 0.61 - 1.24 mg/dL   Calcium 8.7 (L) 8.9 - 10.3 mg/dL   Total Protein 6.9 6.5 - 8.1 g/dL   Albumin 3.4 (L) 3.5 - 5.0 g/dL   AST 27 15 - 41 U/L   ALT 16 0 - 44 U/L   Alkaline Phosphatase 40 38 - 126 U/L   Total Bilirubin 1.4 (H) 0.0 - 1.2 mg/dL   GFR, Estimated 46 (L) >60 mL/min    Comment: (NOTE) Calculated using the CKD-EPI Creatinine Equation (2021)    Anion gap 12 5 - 15    Comment: Performed at [facility], [address], [place], [place] [address]  Protime-INR     Status: Abnormal   Collection Time: 02/03/23  4:02 AM  Result Value Ref Range   Prothrombin Time 27.2 (H) 11.4 - 15.2 seconds   INR 2.5 (H) 0.8 - 1.2    Comment: (NOTE) INR goal varies based on device and disease states. Performed at [facility], [address], [place], [place] [address]   Procalcitonin     Status: None   Collection Time: 02/03/23  8:03 AM  Result Value Ref Range   Procalcitonin 42.01 ng/mL    Comment:        Interpretation: PCT >= 10 ng/mL: Important systemic inflammatory response, almost exclusively due to severe bacterial sepsis or septic shock. (NOTE)       Sepsis PCT Algorithm           Lower Respiratory Tract                                      Infection PCT Algorithm    ----------------------------     ----------------------------         PCT < 0.25 ng/mL                PCT < 0.10 ng/mL          Strongly encourage             Strongly discourage   discontinuation of antibiotics    initiation of antibiotics    ----------------------------     -----------------------------       PCT 0.25 - 0.50 ng/mL             PCT 0.10 - 0.25 ng/mL               OR       >80% decrease in PCT            Discourage initiation of  antibiotics      Encourage discontinuation           of antibiotics    ----------------------------     -----------------------------         PCT >= 0.50 ng/mL              PCT 0.26 - 0.50 ng/mL                AND       <80% decrease in PCT             Encourage initiation of                                             antibiotics       Encourage continuation           of antibiotics    ----------------------------     -----------------------------        PCT >= 0.50 ng/mL                  PCT > 0.50 ng/mL               AND         increase in PCT                  Strongly encourage                                      initiation of antibiotics    Strongly encourage escalation           of antibiotics                                     -----------------------------                                           PCT <= 0.25 ng/mL                                                 OR                                        > 80% decrease in PCT                                      Discontinue / Do not initiate                                             antibiotics  Performed at [facility], [address], [place], [place] [address]   Urinalysis, Routine w reflex microscopic -Urine, Clean Catch  Status: Abnormal   Collection Time: 02/03/23 11:25 AM  Result Value Ref Range   Color, Urine YELLOW YELLOW   APPearance CLOUDY (A) CLEAR   Specific Gravity, Urine 1.010 1.005 - 1.030   pH 6.0 5.0 - 8.0   Glucose, UA NEGATIVE NEGATIVE mg/dL   Hgb urine dipstick LARGE (A) NEGATIVE   Bilirubin Urine NEGATIVE NEGATIVE   Ketones, ur NEGATIVE NEGATIVE mg/dL   Protein, ur NEGATIVE NEGATIVE mg/dL   Nitrite NEGATIVE NEGATIVE   Leukocytes,Ua LARGE (A) NEGATIVE   RBC / HPF 11-20 0 - 5 RBC/hpf   WBC, UA >50 0 - 5 WBC/hpf    Bacteria, UA MANY (A) NONE SEEN   Squamous Epithelial / HPF 0-5 0 - 5 /HPF   Mucus PRESENT    Budding Yeast PRESENT     Comment: Performed at [facility], [address], [place], [place] [address]    ECHOCARDIOGRAM COMPLETE Result Date: 02/03/2023    ECHOCARDIOGRAM REPORT   Patient Name:   Gerald Potempa. Date of Exam: 02/03/2023 Medical Rec #:  [id number]             Height:       72.0 in Accession #:    [id number]            Weight:       173.3 lb Date of Birth:  11/25/1935             BSA:          2.005 m Patient Age:    87 years              BP:           91/64 mmHg Patient Gender: M                     HR:           76 bpm. Exam Location:  Inpatient Procedure: 2D Echo, Cardiac Doppler, Color Doppler and Intracardiac            Opacification Agent Indications:    TIA  History:        Patient has prior history of Echocardiogram examinations, most                 recent 03/19/2022. Previous Myocardial Infarction and CAD, COPD,                 Arrythmias:Atrial Fibrillation; Risk Factors:Hypertension.                 Aortic Valve: bioprosthetic valve is present in the aortic                 position.                 Mitral Valve: prosthetic annuloplasty ring valve is present in                 the mitral position.  Sonographer:    Webb Laws Referring Phys: [id number] DAVID MANUEL ORTIZ IMPRESSIONS  1. S/P MV repair with mean gradient 8 mmHg and no MR; s/p AVR with mean gradient 14 mmHg and no AI; s/p TV repair with mild TR.  2. Left ventricular ejection fraction, by estimation, is 60 to 65%. The left ventricle has normal function. The left ventricle has no regional wall motion abnormalities. There is moderate left ventricular hypertrophy. Left ventricular diastolic parameters are indeterminate. There is the interventricular septum is flattened in  systole and diastole, consistent with right ventricular pressure and volume overload.  3. Right ventricular systolic function is  normal. The right ventricular size is mildly enlarged. There is severely elevated pulmonary artery systolic pressure.  4. Left atrial size was severely dilated.  5. Right atrial size was severely dilated.  6. The mitral valve has been repaired/replaced. No evidence of mitral valve regurgitation. No evidence of mitral stenosis. There is a prosthetic annuloplasty ring present in the mitral position.  7. The tricuspid valve is has been repaired/replaced. The tricuspid valve is status post repair with an annuloplasty ring.  8. The aortic valve has been repaired/replaced. Aortic valve regurgitation is not visualized. No aortic stenosis is present. There is a bioprosthetic valve present in the aortic position.  9. The inferior vena cava is dilated in size with <50% respiratory variability, suggesting right atrial pressure of 15 mmHg. FINDINGS  Left Ventricle: Left ventricular ejection fraction, by estimation, is 60 to 65%. The left ventricle has normal function. The left ventricle has no regional wall motion abnormalities. Definity contrast agent was given IV to delineate the left ventricular  endocardial borders. The left ventricular internal cavity size was normal in size. There is moderate left ventricular hypertrophy. The interventricular septum is flattened in systole and diastole, consistent with right ventricular pressure and volume overload. Left ventricular diastolic parameters are indeterminate. Right Ventricle: The right ventricular size is mildly enlarged. Right ventricular systolic function is normal. There is severely elevated pulmonary artery systolic pressure. The tricuspid regurgitant velocity is 3.39 m/s, and with an assumed right atrial  pressure of 15 mmHg, the estimated right ventricular systolic pressure is 61.0 mmHg. Left Atrium: Left atrial size was severely dilated. Right Atrium: Right atrial size was severely dilated. Pericardium: There is no evidence of pericardial effusion. Mitral Valve: The  mitral valve has been repaired/replaced. No evidence of mitral valve regurgitation. There is a prosthetic annuloplasty ring present in the mitral position. No evidence of mitral valve stenosis. MV peak gradient, 22.5 mmHg. The mean mitral valve gradient is 8.0 mmHg. Tricuspid Valve: The tricuspid valve is has been repaired/replaced. Tricuspid valve regurgitation is mild . No evidence of tricuspid stenosis. The tricuspid valve is status post repair with an annuloplasty ring. Aortic Valve: The aortic valve has been repaired/replaced. Aortic valve regurgitation is not visualized. No aortic stenosis is present. Aortic valve mean gradient measures 14.2 mmHg. Aortic valve peak gradient measures 17.8 mmHg. Aortic valve area, by VTI measures 1.60 cm. There is a bioprosthetic valve present in the aortic position. Pulmonic Valve: The pulmonic valve was normal in structure. Pulmonic valve regurgitation is mild. No evidence of pulmonic stenosis. Aorta: The aortic root is normal in size and structure. Venous: The inferior vena cava is dilated in size with less than 50% respiratory variability, suggesting right atrial pressure of 15 mmHg. IAS/Shunts: No atrial level shunt detected by color flow Doppler. Additional Comments: S/P MV repair with mean gradient 8 mmHg and no MR; s/p AVR with mean gradient 14 mmHg and no AI; s/p TV repair with mild TR.  LEFT VENTRICLE PLAX 2D LVIDd:         4.50 cm      Diastology LVIDs:         3.50 cm      LV e' medial:    3.37 cm/s LV PW:         1.20 cm      LV E/e' medial:  59.6 LV IVS:  1.40 cm      LV e' lateral:   7.40 cm/s LVOT diam:     2.10 cm      LV E/e' lateral: 27.2 LV SV:         72 LV SV Index:   36 LVOT Area:     3.46 cm  LV Volumes (MOD) LV vol d, MOD A2C: 120.0 ml LV vol d, MOD A4C: 98.9 ml LV vol s, MOD A2C: 55.1 ml LV vol s, MOD A4C: 41.1 ml LV SV MOD A2C:     64.9 ml LV SV MOD A4C:     98.9 ml LV SV MOD BP:      60.2 ml RIGHT VENTRICLE             IVC RV Basal diam:  4.65  cm     IVC diam: 3.20 cm RV Mid diam:    4.20 cm RV S prime:     10.30 cm/s TAPSE (M-mode): 1.6 cm LEFT ATRIUM              Index        RIGHT ATRIUM           Index LA diam:        4.70 cm  2.34 cm/m   RA Area:     21.10 cm LA Vol (A2C):   141.0 ml 70.34 ml/m  RA Volume:   54.90 ml  27.39 ml/m LA Vol (A4C):   98.7 ml  49.24 ml/m LA Biplane Vol: 120.0 ml 59.86 ml/m  AORTIC VALVE                     PULMONIC VALVE AV Area (Vmax):    1.99 cm      PR End Diast Vel: 1.85 msec AV Area (Vmean):   1.63 cm AV Area (VTI):     1.60 cm AV Vmax:           211.12 cm/s AV Vmean:          176.800 cm/s AV VTI:            0.452 m AV Peak Grad:      17.8 mmHg AV Mean Grad:      14.2 mmHg LVOT Vmax:         121.00 cm/s LVOT Vmean:        83.000 cm/s LVOT VTI:          0.209 m LVOT/AV VTI ratio: 0.46  AORTA Ao Root diam: 3.30 cm Ao Asc diam:  3.50 cm MITRAL VALVE                TRICUSPID VALVE MV Area (PHT): 1.86 cm     TR Peak grad:   46.0 mmHg MV Area VTI:   1.10 cm     TR Vmax:        339.00 cm/s MV Peak grad:  22.5 mmHg MV Mean grad:  8.0 mmHg     SHUNTS MV Vmax:       2.37 m/s     Systemic VTI:  0.21 m MV Vmean:      132.0 cm/s   Systemic Diam: 2.10 cm MV Decel Time: 408 msec MV E velocity: 201.00 cm/s MV A velocity: 55.60 cm/s MV E/A ratio:  3.62 Olga Millers MD Electronically signed by Olga Millers MD Signature Date/Time: 02/03/2023/3:57:16 PM    Final    DG Abd 1 View Result Date: 02/03/2023 CLINICAL DATA:  Nasogastric  tube placement. EXAM: ABDOMEN - 1 VIEW COMPARISON:  Earlier today FINDINGS: The enteric tube has been advanced, the tip and side port are below the diaphragm in the stomach. No bowel dilatation in the upper abdomen. Right upper quadrant surgical clips. IMPRESSION: Enteric tube tip and side port below the diaphragm in the stomach. Electronically Signed   By: Narda Rutherford M.D.   On: 02/03/2023 15:50   DG Abd 1 View Result Date: 02/03/2023 CLINICAL DATA:  Nausea and vomiting EXAM: ABDOMEN - 1  VIEW; PORTABLE ABDOMEN - 1 VIEW COMPARISON:  None Available. FINDINGS: Sequential images of the upper abdomen demonstrate placement of an esophagogastric tube, tip below the diaphragm, side port above the gastroesophageal junction. Nonobstructive pattern of included bowel gas. No free air on supine radiographs. Cardiomegaly status post median sternotomy with valvular prosthesis and left atrial appendage clip. IMPRESSION: 1. Sequential images of the upper abdomen demonstrate placement of an esophagogastric tube, tip below the diaphragm, side port above the gastroesophageal junction. Recommend advancement. 2. Nonobstructive pattern of included bowel gas. Electronically Signed   By: Jearld Lesch M.D.   On: 02/03/2023 13:22   DG Abd Portable 1V Result Date: 02/03/2023 CLINICAL DATA:  Nausea and vomiting EXAM: ABDOMEN - 1 VIEW; PORTABLE ABDOMEN - 1 VIEW COMPARISON:  None Available. FINDINGS: Sequential images of the upper abdomen demonstrate placement of an esophagogastric tube, tip below the diaphragm, side port above the gastroesophageal junction. Nonobstructive pattern of included bowel gas. No free air on supine radiographs. Cardiomegaly status post median sternotomy with valvular prosthesis and left atrial appendage clip. IMPRESSION: 1. Sequential images of the upper abdomen demonstrate placement of an esophagogastric tube, tip below the diaphragm, side port above the gastroesophageal junction. Recommend advancement. 2. Nonobstructive pattern of included bowel gas. Electronically Signed   By: Jearld Lesch M.D.   On: 02/03/2023 13:22   DG CHEST PORT 1 VIEW Result Date: 02/03/2023 CLINICAL DATA:  Hypoxia, status post aortic valve replacement EXAM: PORTABLE CHEST 1 VIEW COMPARISON:  02/02/2023 FINDINGS: Cardiomegaly status post median sternotomy with aortic valve prosthesis and left atrial appendage clip. Similar retrocardiac atelectasis. Mild diffuse interstitial opacity. No acute osseous findings. IMPRESSION:  1. Cardiomegaly status post median sternotomy with aortic valve prosthesis and left atrial appendage clip. 2. Similar retrocardiac atelectasis. Mild diffuse interstitial opacity, consistent with edema. Electronically Signed   By: Jearld Lesch M.D.   On: 02/03/2023 12:22   DG Chest Portable 1 View Result Date: 02/02/2023 CLINICAL DATA:  Shortness of breath. EXAM: PORTABLE CHEST 1 VIEW COMPARISON:  02/16/2022 FINDINGS: The cardio pericardial silhouette is enlarged. Diffuse interstitial opacity suggests edema. No focal lung consolidation. No substantial pleural effusion. Bones are diffusely demineralized. Telemetry leads overlie the chest. IMPRESSION: Enlargement of the cardiopericardial silhouette with diffuse interstitial opacity suggesting edema. Electronically Signed   By: Kennith Center M.D.   On: 02/02/2023 08:21    I have personally reviewed the relevant abdominal films, labs    A/P: Gerald Nicklaus. is an 88 y.o. male with n/v, ileus vs sbo.  NG in place.  Pt feels much better.  Cont NG for now.     I spent a total of 64 minutes in both face-to-face and non-face-to-face activities, excluding procedures performed, for this visit on the date of this encounter. / Moderate MDM  Vanita Panda, MD  Colorectal and General Surgery [facility]

## 2023-02-04 ENCOUNTER — Inpatient Hospital Stay (HOSPITAL_COMMUNITY): Payer: Medicare Other

## 2023-02-04 DIAGNOSIS — I4821 Permanent atrial fibrillation: Secondary | ICD-10-CM | POA: Diagnosis not present

## 2023-02-04 DIAGNOSIS — Z66 Do not resuscitate: Secondary | ICD-10-CM

## 2023-02-04 DIAGNOSIS — J690 Pneumonitis due to inhalation of food and vomit: Secondary | ICD-10-CM

## 2023-02-04 DIAGNOSIS — J9601 Acute respiratory failure with hypoxia: Secondary | ICD-10-CM | POA: Diagnosis not present

## 2023-02-04 DIAGNOSIS — N179 Acute kidney failure, unspecified: Secondary | ICD-10-CM

## 2023-02-04 DIAGNOSIS — Z952 Presence of prosthetic heart valve: Secondary | ICD-10-CM

## 2023-02-04 DIAGNOSIS — Z9889 Other specified postprocedural states: Secondary | ICD-10-CM | POA: Diagnosis not present

## 2023-02-04 DIAGNOSIS — I2721 Secondary pulmonary arterial hypertension: Secondary | ICD-10-CM | POA: Diagnosis not present

## 2023-02-04 HISTORY — DX: Pneumonitis due to inhalation of food and vomit: J69.0

## 2023-02-04 LAB — BASIC METABOLIC PANEL
Anion gap: 13 (ref 5–15)
BUN: 72 mg/dL — ABNORMAL HIGH (ref 8–23)
CO2: 22 mmol/L (ref 22–32)
Calcium: 8.3 mg/dL — ABNORMAL LOW (ref 8.9–10.3)
Chloride: 111 mmol/L (ref 98–111)
Creatinine, Ser: 2.39 mg/dL — ABNORMAL HIGH (ref 0.61–1.24)
GFR, Estimated: 26 mL/min — ABNORMAL LOW (ref >60)
Glucose, Bld: 145 mg/dL — ABNORMAL HIGH (ref 70–99)
Potassium: 4.6 mmol/L (ref 3.5–5.1)
Sodium: 146 mmol/L — ABNORMAL HIGH (ref 135–145)

## 2023-02-04 LAB — COMPREHENSIVE METABOLIC PANEL
ALT: 23 U/L (ref 0–44)
AST: 56 U/L — ABNORMAL HIGH (ref 15–41)
Albumin: 3.4 g/dL — ABNORMAL LOW (ref 3.5–5.0)
Alkaline Phosphatase: 52 U/L (ref 38–126)
Anion gap: 13 (ref 5–15)
BUN: 63 mg/dL — ABNORMAL HIGH (ref 8–23)
CO2: 22 mmol/L (ref 22–32)
Calcium: 8.6 mg/dL — ABNORMAL LOW (ref 8.9–10.3)
Chloride: 110 mmol/L (ref 98–111)
Creatinine, Ser: 2.51 mg/dL — ABNORMAL HIGH (ref 0.61–1.24)
GFR, Estimated: 24 mL/min — ABNORMAL LOW (ref >60)
Glucose, Bld: 139 mg/dL — ABNORMAL HIGH (ref 70–99)
Potassium: 5.3 mmol/L — ABNORMAL HIGH (ref 3.5–5.1)
Sodium: 145 mmol/L (ref 135–145)
Total Bilirubin: 2.1 mg/dL — ABNORMAL HIGH (ref 0.0–1.2)
Total Protein: 7.2 g/dL (ref 6.5–8.1)

## 2023-02-04 LAB — CBC
HCT: 28.1 % — ABNORMAL LOW (ref 39.0–52.0)
HCT: 30.8 % — ABNORMAL LOW (ref 39.0–52.0)
Hemoglobin: 8.8 g/dL — ABNORMAL LOW (ref 13.0–17.0)
Hemoglobin: 9.4 g/dL — ABNORMAL LOW (ref 13.0–17.0)
MCH: 29.4 pg (ref 26.0–34.0)
MCH: 29.7 pg (ref 26.0–34.0)
MCHC: 30.5 g/dL (ref 30.0–36.0)
MCHC: 31.3 g/dL (ref 30.0–36.0)
MCV: 94 fL (ref 80.0–100.0)
MCV: 97.2 fL (ref 80.0–100.0)
Platelets: 61 10*3/uL — ABNORMAL LOW (ref 150–400)
Platelets: 73 10*3/uL — ABNORMAL LOW (ref 150–400)
RBC: 2.99 MIL/uL — ABNORMAL LOW (ref 4.22–5.81)
RBC: 3.17 MIL/uL — ABNORMAL LOW (ref 4.22–5.81)
RDW: 16.8 % — ABNORMAL HIGH (ref 11.5–15.5)
RDW: 16.8 % — ABNORMAL HIGH (ref 11.5–15.5)
WBC: 23.1 10*3/uL — ABNORMAL HIGH (ref 4.0–10.5)
WBC: 23.7 10*3/uL — ABNORMAL HIGH (ref 4.0–10.5)
nRBC: 0 % (ref 0.0–0.2)
nRBC: 0 % (ref 0.0–0.2)

## 2023-02-04 LAB — BLOOD CULTURE ID PANEL (REFLEXED) - BCID2

## 2023-02-04 LAB — URINALYSIS, W/ REFLEX TO CULTURE (INFECTION SUSPECTED)
Bilirubin Urine: NEGATIVE
Glucose, UA: NEGATIVE mg/dL
Ketones, ur: NEGATIVE mg/dL
Nitrite: NEGATIVE
Protein, ur: NEGATIVE mg/dL
Specific Gravity, Urine: 1.01 (ref 1.005–1.030)
WBC, UA: >50 WBC/hpf (ref 0–5)
pH: 5 (ref 5.0–8.0)

## 2023-02-04 LAB — GLUCOSE, CAPILLARY
Glucose-Capillary: 126 mg/dL — ABNORMAL HIGH (ref 70–99)
Glucose-Capillary: 143 mg/dL — ABNORMAL HIGH (ref 70–99)
Glucose-Capillary: 132 mg/dL — ABNORMAL HIGH (ref 70–99)
Glucose-Capillary: 79 mg/dL (ref 70–99)

## 2023-02-04 LAB — PROTIME-INR
INR: 2.5 — ABNORMAL HIGH (ref 0.8–1.2)
INR: 2.6 — ABNORMAL HIGH (ref 0.8–1.2)
Prothrombin Time: 27.6 s — ABNORMAL HIGH (ref 11.4–15.2)
Prothrombin Time: 28.1 s — ABNORMAL HIGH (ref 11.4–15.2)

## 2023-02-04 LAB — BRAIN NATRIURETIC PEPTIDE: B Natriuretic Peptide: 1032.1 pg/mL — ABNORMAL HIGH (ref 0.0–100.0)

## 2023-02-04 LAB — CORTISOL: Cortisol, Plasma: 29.7 ug/dL

## 2023-02-04 LAB — MRSA NEXT GEN BY PCR, NASAL: MRSA by PCR Next Gen: NOT DETECTED

## 2023-02-04 LAB — LACTIC ACID, PLASMA: Lactic Acid, Venous: 3.2 mmol/L (ref 0.5–1.9)

## 2023-02-04 MED ORDER — SODIUM CHLORIDE 0.9 % IV SOLN
2.0000 g | INTRAVENOUS | Status: DC
  Administered 2023-02-04: 2 g via INTRAVENOUS
  Filled 2023-02-04: qty 12.5

## 2023-02-04 MED ORDER — SODIUM CHLORIDE 3 % IN NEBU
4.0000 mL | INHALATION_SOLUTION | Freq: Four times a day (QID) | RESPIRATORY_TRACT | Status: AC
  Administered 2023-02-04 – 2023-02-07 (×10): 4 mL via RESPIRATORY_TRACT
  Filled 2023-02-04 (×12): qty 4

## 2023-02-04 MED ORDER — ACETAMINOPHEN 325 MG PO TABS: 650.0000 mg | ORAL_TABLET | Freq: Four times a day (QID) | ORAL | Status: DC | PRN

## 2023-02-04 MED ORDER — MORPHINE SULFATE (PF) 2 MG/ML IV SOLN: 2.0000 mg | INTRAVENOUS | Status: DC | PRN

## 2023-02-04 MED ORDER — NOREPINEPHRINE 4 MG/250ML-% IV SOLN
2.0000 ug/min | INTRAVENOUS | Status: DC
  Administered 2023-02-04: 16 ug/min via INTRAVENOUS
  Administered 2023-02-04: 2 ug/min via INTRAVENOUS
  Administered 2023-02-05: 5 ug/min via INTRAVENOUS
  Administered 2023-02-05: 13 ug/min via INTRAVENOUS
  Filled 2023-02-04 (×4): qty 250

## 2023-02-04 MED ORDER — FUROSEMIDE 10 MG/ML IJ SOLN: 40.0000 mg | Freq: Once | INTRAMUSCULAR | Status: DC

## 2023-02-04 MED ORDER — ACETAMINOPHEN 650 MG RE SUPP: 650.0000 mg | Freq: Four times a day (QID) | RECTAL | Status: DC | PRN

## 2023-02-04 MED ORDER — IPRATROPIUM-ALBUTEROL 0.5-2.5 (3) MG/3ML IN SOLN
3.0000 mL | RESPIRATORY_TRACT | Status: DC | PRN
  Administered 2023-02-04: 3 mL via RESPIRATORY_TRACT

## 2023-02-04 MED ORDER — ACETAMINOPHEN 10 MG/ML IV SOLN
1000.0000 mg | Freq: Four times a day (QID) | INTRAVENOUS | Status: AC
  Administered 2023-02-04 – 2023-02-05 (×3): 1000 mg via INTRAVENOUS
  Filled 2023-02-04 (×4): qty 100

## 2023-02-04 MED ORDER — LACTATED RINGERS IV BOLUS
250.0000 mL | Freq: Once | INTRAVENOUS | Status: AC
  Administered 2023-02-04: 250 mL via INTRAVENOUS

## 2023-02-04 MED ORDER — IPRATROPIUM-ALBUTEROL 0.5-2.5 (3) MG/3ML IN SOLN
3.0000 mL | Freq: Four times a day (QID) | RESPIRATORY_TRACT | Status: DC
  Administered 2023-02-04 – 2023-02-11 (×27): 3 mL via RESPIRATORY_TRACT
  Filled 2023-02-04 (×29): qty 3

## 2023-02-04 MED ORDER — LACTATED RINGERS IV SOLN: INTRAVENOUS | Status: DC

## 2023-02-04 MED ORDER — MIDAZOLAM HCL 2 MG/2ML IJ SOLN: 2.0000 mg | INTRAMUSCULAR | Status: DC | PRN

## 2023-02-04 MED ORDER — SODIUM CHLORIDE 0.9% FLUSH: 3.0000 mL | INTRAVENOUS | Status: DC | PRN

## 2023-02-04 MED ORDER — SODIUM CHLORIDE 0.9 % IV SOLN: 250.0000 mL | INTRAVENOUS | Status: AC

## 2023-02-04 MED ORDER — GLYCOPYRROLATE 0.2 MG/ML IJ SOLN: 0.2000 mg | INTRAMUSCULAR | Status: DC | PRN

## 2023-02-04 MED ORDER — SODIUM CHLORIDE 0.9% FLUSH
3.0000 mL | Freq: Two times a day (BID) | INTRAVENOUS | Status: DC
  Administered 2023-02-04: 4 mL via INTRAVENOUS
  Administered 2023-02-05 – 2023-02-06 (×3): 10 mL via INTRAVENOUS
  Administered 2023-02-06: 8 mL via INTRAVENOUS
  Administered 2023-02-07 – 2023-02-08 (×2): 10 mL via INTRAVENOUS
  Administered 2023-02-08: 5 mL via INTRAVENOUS
  Administered 2023-02-09: 3 mL via INTRAVENOUS
  Administered 2023-02-10 – 2023-02-13 (×6): 10 mL via INTRAVENOUS
  Administered 2023-02-13: 5 mL via INTRAVENOUS
  Administered 2023-02-14 – 2023-02-19 (×8): 10 mL via INTRAVENOUS

## 2023-02-04 MED ORDER — GLYCOPYRROLATE 1 MG PO TABS: 1.0000 mg | ORAL_TABLET | ORAL | Status: DC | PRN

## 2023-02-04 MED ORDER — IPRATROPIUM-ALBUTEROL 0.5-2.5 (3) MG/3ML IN SOLN: 3.0000 mL | Freq: Two times a day (BID) | RESPIRATORY_TRACT | Status: DC

## 2023-02-04 MED ORDER — SODIUM CHLORIDE 0.9 % IV SOLN
1.0000 g | Freq: Two times a day (BID) | INTRAVENOUS | Status: DC
  Administered 2023-02-04 – 2023-02-05 (×2): 1 g via INTRAVENOUS
  Filled 2023-02-04 (×2): qty 20

## 2023-02-04 MED ORDER — HALOPERIDOL LACTATE 5 MG/ML IJ SOLN: 2.5000 mg | INTRAMUSCULAR | Status: DC | PRN

## 2023-02-04 MED ORDER — IPRATROPIUM-ALBUTEROL 0.5-2.5 (3) MG/3ML IN SOLN: 3.0000 mL | Freq: Four times a day (QID) | RESPIRATORY_TRACT | Status: DC

## 2023-02-04 MED ORDER — SODIUM CHLORIDE 0.9 % IV SOLN: 3.0000 g | Freq: Two times a day (BID) | INTRAVENOUS | Status: DC

## 2023-02-04 MED ORDER — POLYVINYL ALCOHOL 1.4 % OP SOLN: 1.0000 [drp] | Freq: Four times a day (QID) | OPHTHALMIC | Status: DC | PRN

## 2023-02-04 MED ORDER — ALBUMIN HUMAN 25 % IV SOLN
12.5000 g | Freq: Once | INTRAVENOUS | Status: AC
  Administered 2023-02-04: 12.5 g via INTRAVENOUS
  Filled 2023-02-04: qty 50

## 2023-02-04 NOTE — TOC Initial Note (Signed)
Transition of Care (TOC) - Initial/Assessment Note   Patient Details  Name: [name]. MRN: [id number] Date of Birth: 03/25/1935  Transition of Care [facility]) CM/SW Contact:    Ewing Schlein, LCSW Phone Number: 02/04/2023, 8:56 AM  Clinical Narrative: Patient is from home with spouse. Patient is currently on IV antibiotics and 5L/min oxygen. TOC following for possible discharge needs.                 Expected Discharge Plan:  (TBD) Barriers to Discharge: Continued Medical Work up  Expected Discharge Plan and Services In-house Referral: Clinical Social Work Living arrangements for the past 2 months: Single Family Home  Prior Living Arrangements/Services Living arrangements for the past 2 months: Single Family Home Lives with:: Spouse Patient language and need for interpreter reviewed:: Yes Do you feel safe going back to the place where you live?: Yes      Need for Family Participation in Patient Care: No (Comment) Care giver support system in place?: Yes (comment) Criminal Activity/Legal Involvement Pertinent to Current Situation/Hospitalization: No - Comment as needed  Activities of Daily Living ADL Screening (condition at time of admission) Independently performs ADLs?: No Does the patient have a NEW difficulty with bathing/dressing/toileting/self-feeding that is expected to last >3 days?: No Does the patient have a NEW difficulty with getting in/out of bed, walking, or climbing stairs that is expected to last >3 days?: No Does the patient have a NEW difficulty with communication that is expected to last >3 days?: No Is the patient deaf or have difficulty hearing?: Yes Does the patient have difficulty seeing, even when wearing glasses/contacts?: No Does the patient have difficulty concentrating, remembering, or making decisions?: Yes  Emotional Assessment Orientation: : Oriented to Self, Oriented to Place, Oriented to  Time Alcohol / Substance Use: Not Applicable Psych  Involvement: No (comment)  Admission diagnosis:  Acute respiratory failure with hypoxia (HCC) [J96.01] Hypervolemia, unspecified hypervolemia type [E87.70] Patient Active Problem List   Diagnosis Date Noted   Acute respiratory failure with hypoxia (HCC) 02/02/2023   Elevated troponin 02/02/2023   Volume overload 02/02/2023   S/P total knee arthroplasty, right 08/09/2022   Gait abnormality 06/28/2022   Bilateral foot-drop 06/28/2022   Preoperative cardiovascular examination 05/30/2022   Other secondary pulmonary hypertension (HCC) 03/20/2022   COVID-19 virus infection 11/11/2021   Weakness 11/08/2021   Urinary catheter in place 11/08/2021   Hematuria 09/25/2021   Symptomatic anemia 09/23/2021   HLD (hyperlipidemia) 09/23/2021   S/P total knee arthroplasty, left 09/19/2021   Abnormal findings on diagnostic imaging of other specified body structures 06/02/2021   Hypoalbuminemia due to protein-calorie malnutrition (HCC) 05/29/2021   Valvular heart disease 05/28/2021   Cerebrovascular accident (CVA) (HCC) 05/21/2021   SIRS (systemic inflammatory response syndrome) (HCC) 05/21/2021   Generalized weakness 05/21/2021   Otitis externa 05/21/2021   CAD (coronary artery disease) 12/19/2020   Malnutrition of moderate degree 08/02/2020   S/P left total hip arthroplasty 08/02/2020   Closed left hip fracture, initial encounter (HCC) 08/01/2020   Chronic congestive heart failure (HCC) 05/26/2020   Elevated PSA 05/26/2020   History of adenomatous polyp of colon 05/26/2020   History of anemia 05/26/2020   Hypercoagulable state (HCC) 05/26/2020   Impairment of balance 05/26/2020   Peripheral neuropathy 05/26/2020   Personal history of other malignant neoplasm of skin 05/26/2020   Prediabetes 05/26/2020   Presence of prosthetic heart valve 05/26/2020   Sleep disorder 05/26/2020   Thrombocytopenia (HCC) 05/26/2020   Vitamin  D deficiency 05/26/2020   Acquired dilation of ascending aorta and  aortic root (HCC)    Anemia 09/05/2018   Anemia due to chronic blood loss 09/05/2018   Long term current use of anticoagulant 02/10/2018   s/p mitral valve repair 01/30/2018   S/P tricuspid valve repair 01/30/2018   S/P Maze operation for atrial fibrillation 01/30/2018   Tricuspid regurgitation    Mitral valve regurgitation 10/14/2017   Permanent atrial fibrillation (HCC)    Hyperglycemia    History of ischemic right MCA stroke 10/29/2016   Hypokalemia    Left hemiparesis (HCC)    Dysphagia, post-stroke    S/P AVR (aortic valve replacement)    Benign prostatic hyperplasia with lower urinary tract symptoms    Essential hypertension    Gout    History of small bowel obstruction    History of BPH    S/P AVR 08/03/2010   Hypertrophy of prostate with urinary obstruction and other lower urinary tract symptoms (LUTS) 05/21/2001   PCP:  Jarrett Soho, PA-C Pharmacy:   [facility]) - [place], [place] - [address] [facility] [address] [place] [address] [place] [address] Phone: [phone] Fax: [phone]  [facility] [address] [place], [place] - [address] [place] [address] Phone: [phone] Fax: [phone]  [facility] - [place], [place] - [address] [place] [address] Phone: [phone] Fax: [phone]  Social Drivers of Health (SDOH) Social History: SDOH Screenings   Food Insecurity: No Food Insecurity (02/03/2023)  Housing: Low Risk  (02/03/2023)  Transportation Needs: No Transportation Needs (02/03/2023)  Utilities: Not At Risk (02/03/2023)  Alcohol Screen: Low Risk  (06/02/2021)  Depression (PHQ2-9): Low Risk  (06/16/2021)  Financial Resource Strain: Low Risk  (06/16/2021)  Social Connections: Unknown (02/03/2023)  Stress: No Stress Concern Present (06/16/2021)  Tobacco Use: Low Risk  (02/02/2023)   SDOH Interventions:     Readmission Risk Interventions    11/10/2021    3:54 PM  Readmission Risk Prevention Plan  Transportation Screening Complete  PCP or Specialist Appt within 3-5 Days Complete  HRI or Home Care Consult Complete  Social Work Consult for Recovery Care Planning/Counseling Complete  Palliative Care Screening Not Applicable  Medication Review Oceanographer) Complete

## 2023-02-04 NOTE — Plan of Care (Signed)
  Problem: Education: Goal: Knowledge of General Education information will improve Description: Including pain rating scale, medication(s)/side effects and non-pharmacologic comfort measures Outcome: Not Progressing   Problem: Clinical Measurements: Goal: Respiratory complications will improve Outcome: Not Progressing

## 2023-02-04 NOTE — Progress Notes (Signed)
F-cath placement, amber colored with sediment noted, uop obtained

## 2023-02-04 NOTE — Progress Notes (Signed)
Pharmacy Antibiotic Note  [name]. is a 88 y.o. male who presented toi the ED on 02/02/2023 with c/o SOB.  Pharmacy has been consulted on 02/03/23 to dose unasyn for PNA.  Today, 02/04/2023: - scr up 2.51 (CrCl ~ 48mls/min) - Plan: - change unasyn dsoing to 3gm IV q12h - Follow renal function  ________________________________________  Height: 6' (182.9 cm) Weight: 78.6 kg (173 lb 4.5 oz) IBW/kg (Calculated) : 77.6  Temp (24hrs), Avg:99.5 F (37.5 C), Min:98.3 F (36.8 C), Max:103.8 F (39.9 C)  Recent Labs  Lab 02/02/23 0806 02/03/23 0402 02/03/23 1955 02/03/23 2348  WBC 8.6 21.7*  --  23.1*  CREATININE 1.19 1.47*  --  2.51*  LATICACIDVEN  --   --  3.1* 3.2*    Estimated Creatinine Clearance: 23.6 mL/min (A) (by C-G formula based on SCr of 2.51 mg/dL (H)).    Allergies  Allergen Reactions   Sulfa Antibiotics Other (See Comments)    Granulocytosis   Sulfamethoxazole-Trimethoprim     Other Reaction(s): Unknown   Zestril [Lisinopril] Cough     Thank you for allowing pharmacy to be a part of this patient's care.  Maurice March 02/04/2023 2:04 AM

## 2023-02-04 NOTE — Progress Notes (Signed)
Pharmacy Brief Note: Anticoagulation  Pt anticoagulated with warfarin for history of atrial fibrillation. Warfarin remains on hold, pt NPO. Pharmacy consulted to initiate UFH once INR subtherapeutic (<2).   INR = 2.6 remains therapeutic. Recheck INR with AM labs tomorrow with plan to start UFH if INR <2.   Cindi Carbon, PharmD 02/04/23 9:19 AM

## 2023-02-04 NOTE — Progress Notes (Addendum)
Rounding Note    Patient Name: [name]. Date of Encounter: 02/04/2023  [facility] Cardiologist: Donato Schultz, MD   Subjective   Patient states he feels better today. Breathing better but still on 2L of nasal cannula. No chest pain or abdominal pain.   Inpatient Medications    Scheduled Meds:  allopurinol  150 mg Oral q morning   arformoterol  15 mcg Nebulization BID   budesonide (PULMICORT) nebulizer solution  0.25 mg Nebulization BID   Chlorhexidine Gluconate Cloth  6 each Topical Daily   ipratropium-albuterol  3 mL Nebulization TID   mouth rinse  15 mL Mouth Rinse 4 times per day   pantoprazole (PROTONIX) IV  40 mg Intravenous Q12H   Continuous Infusions:  sodium chloride 10 mL/hr at 02/04/23 0619   ampicillin-sulbactam (UNASYN) IV     lactated ringers     PRN Meds: sodium chloride, acetaminophen **OR** acetaminophen, ondansetron **OR** ondansetron (ZOFRAN) IV, mouth rinse, traZODone   Vital Signs    Vitals:   02/04/23 0438 02/04/23 0500 02/04/23 0600 02/04/23 0700  BP:  100/62 (!) 100/55 (!) 108/47  Pulse:  65 (!) 56 (!) 52  Resp:  (!) 22 20 (!) 31  Temp:   99.1 F (37.3 C)   TempSrc:   Oral   SpO2:  96% (!) 88% 90%  Weight: 76.1 kg     Height:        Intake/Output Summary (Last 24 hours) at 02/04/2023 0721 Last data filed at 02/04/2023 6045 Gross per 24 hour  Intake 1122.98 ml  Output 995 ml  Net 127.98 ml      02/04/2023    4:38 AM 02/03/2023    5:00 AM 02/02/2023    7:58 AM  Last 3 Weights  Weight (lbs) 167 lb 12.3 oz 173 lb 4.5 oz 175 lb 14.8 oz  Weight (kg) 76.1 kg 78.6 kg 79.8 kg      Telemetry    Atrial fibrillation with rates in the 50s to 60s. Occasional PVC.  - Personally Reviewed  ECG    No new ECG tracing today. - Personally Reviewed  Physical Exam   GEN: Caucasian male resting comfortably in no acute distress.  2L of O2 via nasal cannula. NG tube in place. Neck: No JVD. Cardiac: Irregularly irregular  rhythm with borderline bradycardia. Soft systolic murmur.    Respiratory: No increased work of breathing. Clear to auscultation bilaterally. No wheezes, rhonchi, or rales. GI: Soft and non-distended. MS: No lower extremity edema; No deformity. Skin: Warm and dry.  Neuro:  No focal deficits. Psych: Normal affect. Responds appropriately.  Labs    High Sensitivity Troponin:   Recent Labs  Lab 02/02/23 0806 02/02/23 0950  TROPONINIHS 34* 59*     Chemistry Recent Labs  Lab 02/02/23 0806 02/03/23 0402 02/03/23 2348  NA 143 140 145  K 3.4* 4.7 5.3*  CL 110 107 110  CO2 19* 21* 22  GLUCOSE 114* 184* 139*  BUN 22 40* 63*  CREATININE 1.19 1.47* 2.51*  CALCIUM 8.8* 8.7* 8.6*  MG  --  2.3  --   PROT 7.1 6.9 7.2  ALBUMIN 3.6 3.4* 3.4*  AST 29 27 56*  ALT 16 16 23   ALKPHOS 69 40 52  BILITOT 1.3* 1.4* 2.1*  GFRNONAA 60* 46* 24*  ANIONGAP 14 12 13     Lipids No results for input(s): "CHOL", "TRIG", "HDL", "LABVLDL", "LDLCALC", "CHOLHDL" in the last 168 hours.  Hematology Recent Labs  Lab 02/02/23 0806 02/03/23 0402 02/03/23 2348  WBC 8.6 21.7* 23.1*  RBC 3.41* 3.05* 3.17*  HGB 10.3* 9.2* 9.4*  HCT 32.5* 28.9* 30.8*  MCV 95.3 94.8 97.2  MCH 30.2 30.2 29.7  MCHC 31.7 31.8 30.5  RDW 15.9* 16.3* 16.8*  PLT 128* 114* 73*   Thyroid No results for input(s): "TSH", "FREET4" in the last 168 hours.  BNP Recent Labs  Lab 02/02/23 0806  BNP 432.5*    DDimer No results for input(s): "DDIMER" in the last 168 hours.   Radiology    ECHOCARDIOGRAM COMPLETE Result Date: 02/03/2023    ECHOCARDIOGRAM REPORT   Patient Name:   Sinan Tuch. Date of Exam: 02/03/2023 Medical Rec #:  [id number]             Height:       72.0 in Accession #:    [id number]            Weight:       173.3 lb Date of Birth:  Mar 27, 1935             BSA:          2.005 m Patient Age:    88 years              BP:           91/64 mmHg Patient Gender: M                     HR:           76 bpm. Exam Location:   Inpatient Procedure: 2D Echo, Cardiac Doppler, Color Doppler and Intracardiac            Opacification Agent Indications:    TIA  History:        Patient has prior history of Echocardiogram examinations, most                 recent 03/19/2022. Previous Myocardial Infarction and CAD, COPD,                 Arrythmias:Atrial Fibrillation; Risk Factors:Hypertension.                 Aortic Valve: bioprosthetic valve is present in the aortic                 position.                 Mitral Valve: prosthetic annuloplasty ring valve is present in                 the mitral position.  Sonographer:    Webb Laws Referring Phys: [id number] DAVID MANUEL ORTIZ IMPRESSIONS  1. S/P MV repair with mean gradient 8 mmHg and no MR; s/p AVR with mean gradient 14 mmHg and no AI; s/p TV repair with mild TR.  2. Left ventricular ejection fraction, by estimation, is 60 to 65%. The left ventricle has normal function. The left ventricle has no regional wall motion abnormalities. There is moderate left ventricular hypertrophy. Left ventricular diastolic parameters are indeterminate. There is the interventricular septum is flattened in systole and diastole, consistent with right ventricular pressure and volume overload.  3. Right ventricular systolic function is normal. The right ventricular size is mildly enlarged. There is severely elevated pulmonary artery systolic pressure.  4. Left atrial size was severely dilated.  5. Right atrial size was severely dilated.  6. The mitral valve has been repaired/replaced. No evidence  of mitral valve regurgitation. No evidence of mitral stenosis. There is a prosthetic annuloplasty ring present in the mitral position.  7. The tricuspid valve is has been repaired/replaced. The tricuspid valve is status post repair with an annuloplasty ring.  8. The aortic valve has been repaired/replaced. Aortic valve regurgitation is not visualized. No aortic stenosis is present. There is a bioprosthetic valve present  in the aortic position.  9. The inferior vena cava is dilated in size with <50% respiratory variability, suggesting right atrial pressure of 15 mmHg. FINDINGS  Left Ventricle: Left ventricular ejection fraction, by estimation, is 60 to 65%. The left ventricle has normal function. The left ventricle has no regional wall motion abnormalities. Definity contrast agent was given IV to delineate the left ventricular  endocardial borders. The left ventricular internal cavity size was normal in size. There is moderate left ventricular hypertrophy. The interventricular septum is flattened in systole and diastole, consistent with right ventricular pressure and volume overload. Left ventricular diastolic parameters are indeterminate. Right Ventricle: The right ventricular size is mildly enlarged. Right ventricular systolic function is normal. There is severely elevated pulmonary artery systolic pressure. The tricuspid regurgitant velocity is 3.39 m/s, and with an assumed right atrial  pressure of 15 mmHg, the estimated right ventricular systolic pressure is 61.0 mmHg. Left Atrium: Left atrial size was severely dilated. Right Atrium: Right atrial size was severely dilated. Pericardium: There is no evidence of pericardial effusion. Mitral Valve: The mitral valve has been repaired/replaced. No evidence of mitral valve regurgitation. There is a prosthetic annuloplasty ring present in the mitral position. No evidence of mitral valve stenosis. MV peak gradient, 22.5 mmHg. The mean mitral valve gradient is 8.0 mmHg. Tricuspid Valve: The tricuspid valve is has been repaired/replaced. Tricuspid valve regurgitation is mild . No evidence of tricuspid stenosis. The tricuspid valve is status post repair with an annuloplasty ring. Aortic Valve: The aortic valve has been repaired/replaced. Aortic valve regurgitation is not visualized. No aortic stenosis is present. Aortic valve mean gradient measures 14.2 mmHg. Aortic valve peak gradient  measures 17.8 mmHg. Aortic valve area, by VTI measures 1.60 cm. There is a bioprosthetic valve present in the aortic position. Pulmonic Valve: The pulmonic valve was normal in structure. Pulmonic valve regurgitation is mild. No evidence of pulmonic stenosis. Aorta: The aortic root is normal in size and structure. Venous: The inferior vena cava is dilated in size with less than 50% respiratory variability, suggesting right atrial pressure of 15 mmHg. IAS/Shunts: No atrial level shunt detected by color flow Doppler. Additional Comments: S/P MV repair with mean gradient 8 mmHg and no MR; s/p AVR with mean gradient 14 mmHg and no AI; s/p TV repair with mild TR.  LEFT VENTRICLE PLAX 2D LVIDd:         4.50 cm      Diastology LVIDs:         3.50 cm      LV e' medial:    3.37 cm/s LV PW:         1.20 cm      LV E/e' medial:  59.6 LV IVS:        1.40 cm      LV e' lateral:   7.40 cm/s LVOT diam:     2.10 cm      LV E/e' lateral: 27.2 LV SV:         72 LV SV Index:   36 LVOT Area:     3.46 cm  LV Volumes (  MOD) LV vol d, MOD A2C: 120.0 ml LV vol d, MOD A4C: 98.9 ml LV vol s, MOD A2C: 55.1 ml LV vol s, MOD A4C: 41.1 ml LV SV MOD A2C:     64.9 ml LV SV MOD A4C:     98.9 ml LV SV MOD BP:      60.2 ml RIGHT VENTRICLE             IVC RV Basal diam:  4.65 cm     IVC diam: 3.20 cm RV Mid diam:    4.20 cm RV S prime:     10.30 cm/s TAPSE (M-mode): 1.6 cm LEFT ATRIUM              Index        RIGHT ATRIUM           Index LA diam:        4.70 cm  2.34 cm/m   RA Area:     21.10 cm LA Vol (A2C):   141.0 ml 70.34 ml/m  RA Volume:   54.90 ml  27.39 ml/m LA Vol (A4C):   98.7 ml  49.24 ml/m LA Biplane Vol: 120.0 ml 59.86 ml/m  AORTIC VALVE                     PULMONIC VALVE AV Area (Vmax):    1.99 cm      PR End Diast Vel: 1.85 msec AV Area (Vmean):   1.63 cm AV Area (VTI):     1.60 cm AV Vmax:           211.12 cm/s AV Vmean:          176.800 cm/s AV VTI:            0.452 m AV Peak Grad:      17.8 mmHg AV Mean Grad:      14.2 mmHg  LVOT Vmax:         121.00 cm/s LVOT Vmean:        83.000 cm/s LVOT VTI:          0.209 m LVOT/AV VTI ratio: 0.46  AORTA Ao Root diam: 3.30 cm Ao Asc diam:  3.50 cm MITRAL VALVE                TRICUSPID VALVE MV Area (PHT): 1.86 cm     TR Peak grad:   46.0 mmHg MV Area VTI:   1.10 cm     TR Vmax:        339.00 cm/s MV Peak grad:  22.5 mmHg MV Mean grad:  8.0 mmHg     SHUNTS MV Vmax:       2.37 m/s     Systemic VTI:  0.21 m MV Vmean:      132.0 cm/s   Systemic Diam: 2.10 cm MV Decel Time: 408 msec MV E velocity: 201.00 cm/s MV A velocity: 55.60 cm/s MV E/A ratio:  3.62 Olga Millers MD Electronically signed by Olga Millers MD Signature Date/Time: 02/03/2023/3:57:16 PM    Final    DG Abd 1 View Result Date: 02/03/2023 CLINICAL DATA:  Nasogastric tube placement. EXAM: ABDOMEN - 1 VIEW COMPARISON:  Earlier today FINDINGS: The enteric tube has been advanced, the tip and side port are below the diaphragm in the stomach. No bowel dilatation in the upper abdomen. Right upper quadrant surgical clips. IMPRESSION: Enteric tube tip and side port below the diaphragm in the stomach. Electronically Signed  By: Narda Rutherford M.D.   On: 02/03/2023 15:50   DG Abd 1 View Result Date: 02/03/2023 CLINICAL DATA:  Nausea and vomiting EXAM: ABDOMEN - 1 VIEW; PORTABLE ABDOMEN - 1 VIEW COMPARISON:  None Available. FINDINGS: Sequential images of the upper abdomen demonstrate placement of an esophagogastric tube, tip below the diaphragm, side port above the gastroesophageal junction. Nonobstructive pattern of included bowel gas. No free air on supine radiographs. Cardiomegaly status post median sternotomy with valvular prosthesis and left atrial appendage clip. IMPRESSION: 1. Sequential images of the upper abdomen demonstrate placement of an esophagogastric tube, tip below the diaphragm, side port above the gastroesophageal junction. Recommend advancement. 2. Nonobstructive pattern of included bowel gas. Electronically Signed    By: Jearld Lesch M.D.   On: 02/03/2023 13:22   DG Abd Portable 1V Result Date: 02/03/2023 CLINICAL DATA:  Nausea and vomiting EXAM: ABDOMEN - 1 VIEW; PORTABLE ABDOMEN - 1 VIEW COMPARISON:  None Available. FINDINGS: Sequential images of the upper abdomen demonstrate placement of an esophagogastric tube, tip below the diaphragm, side port above the gastroesophageal junction. Nonobstructive pattern of included bowel gas. No free air on supine radiographs. Cardiomegaly status post median sternotomy with valvular prosthesis and left atrial appendage clip. IMPRESSION: 1. Sequential images of the upper abdomen demonstrate placement of an esophagogastric tube, tip below the diaphragm, side port above the gastroesophageal junction. Recommend advancement. 2. Nonobstructive pattern of included bowel gas. Electronically Signed   By: Jearld Lesch M.D.   On: 02/03/2023 13:22   DG CHEST PORT 1 VIEW Result Date: 02/03/2023 CLINICAL DATA:  Hypoxia, status post aortic valve replacement EXAM: PORTABLE CHEST 1 VIEW COMPARISON:  02/02/2023 FINDINGS: Cardiomegaly status post median sternotomy with aortic valve prosthesis and left atrial appendage clip. Similar retrocardiac atelectasis. Mild diffuse interstitial opacity. No acute osseous findings. IMPRESSION: 1. Cardiomegaly status post median sternotomy with aortic valve prosthesis and left atrial appendage clip. 2. Similar retrocardiac atelectasis. Mild diffuse interstitial opacity, consistent with edema. Electronically Signed   By: Jearld Lesch M.D.   On: 02/03/2023 12:22   DG Chest Portable 1 View Result Date: 02/02/2023 CLINICAL DATA:  Shortness of breath. EXAM: PORTABLE CHEST 1 VIEW COMPARISON:  02/16/2022 FINDINGS: The cardio pericardial silhouette is enlarged. Diffuse interstitial opacity suggests edema. No focal lung consolidation. No substantial pleural effusion. Bones are diffusely demineralized. Telemetry leads overlie the chest. IMPRESSION: Enlargement of the  cardiopericardial silhouette with diffuse interstitial opacity suggesting edema. Electronically Signed   By: Kennith Center M.D.   On: 02/02/2023 08:21    Cardiac Studies   Echocardiogram 02/03/2023: Impressions:  1. S/P MV repair with mean gradient 8 mmHg and no MR; s/p AVR with mean  gradient 14 mmHg and no AI; s/p TV repair with mild TR.   2. Left ventricular ejection fraction, by estimation, is 60 to 65%. The  left ventricle has normal function. The left ventricle has no regional  wall motion abnormalities. There is moderate left ventricular hypertrophy.  Left ventricular diastolic  parameters are indeterminate. There is the interventricular septum is  flattened in systole and diastole, consistent with right ventricular  pressure and volume overload.   3. Right ventricular systolic function is normal. The right ventricular  size is mildly enlarged. There is severely elevated pulmonary artery  systolic pressure.   4. Left atrial size was severely dilated.   5. Right atrial size was severely dilated.   6. The mitral valve has been repaired/replaced. No evidence of mitral  valve regurgitation. No evidence  of mitral stenosis. There is a prosthetic  annuloplasty ring present in the mitral position.   7. The tricuspid valve is has been repaired/replaced. The tricuspid valve  is status post repair with an annuloplasty ring.   8. The aortic valve has been repaired/replaced. Aortic valve  regurgitation is not visualized. No aortic stenosis is present. There is a  bioprosthetic valve present in the aortic position.   9. The inferior vena cava is dilated in size with <50% respiratory  variability, suggesting right atrial pressure of 15 mmHg.    Patient Profile     88 y.o. male with a history of non-obstructive CAD on cardiac catheterization in 10/2017, permanent atrial fibrillation on Coumadin,  aortic insufficiency s/p bioprosthetic AVR and PFO closure in 2012, severe mitral regurgitation  and tricuspid regurgitation s/p mitral valve repair and tricuspid valve repair in 01/2018, dilated ascending thoracic aorta, severe pulmonary hypertension, borderline diabetes, CVA, chronic anemia, and chronic thrombocytopenia who was admitted on 02/02/2023 for acute hypoxic respiratory failure secondary to acute diastolic CHF after presenting with shortness of breath and edema. Hospitalization also complicated by suspect aspiration pneumonia and small bowel obstruction requiring placement of NG tube.  Assessment & Plan    New Onset Acute Diastolic CHF Patient presented with shortness of breath and edema. BNP 432. Chest x-ray showed enlargement of cardiopericardial silhouette with diffuse interstitial opacity suggesting edema. Echo showed LVEF of 60-65% with moderate LVH, mildly enlarged RV with normal systolic function, and flattened interventricular septum in systole and diastole consistent with RV pressure and volume overload. He was started on IV Lasix but this was stopped yesterday evening in setting of soft BP and AKI. Only net negative 352 mL this admission. - Does not appear significantly volume overloaded to be. Edema has improved and no significant crackles. Lactic acid and WBC was rising yesterday evening so I think shortness of breath and hypoxia are more due to aspiration pneumonia.  - Will hold off on any additional IV Lasix at this time given AKI. - No Spironolactone of SGLT2 inhibitor due to renal function. - Continue to monitor daily weight, strict I/Os, and renal function.  Elevated Troponin Non-Obstructive CAD Noted on cardiac catheterization in 2019. High-sensitivity troponin minimally elevated but flat at 34 >> 59. - No chest pain.  - No aspirin due to need for full anticoagulation. - Can resume home statin when able to take PO medications. - Troponin elevation consistent with demand ischemia in setting of underlying illness. No plans for ischemic evaluation.  Permanent Atrial  Fibrillation Rates in the 50s to 60s. - He is on Atenolol 12.5mg  daily at home but this was not resumed on admission. Can continue to hold with baseline bradycardia. - On chronic anticoagulation with Coumadin. Dosing per Pharmacy. Plan is to start Heparin when INR drop below 2.  Valvular Disease Patient has a significant history of valvular disease with prior bioprosthetic AVR in 2012 and then mitral valve and tricuspid valve repairs in 2020. Echo this admission is stable. Mean gradient across AVR is with no AI. Mean gradient of mitral valve is 8 mmHg with no MR. There is mild TR.  Pulmonary Hypertension Patient has history of severe pulmonary hypertension. RVSP 61 mmHg on Echo this admission which is stable from last Echo in 03/2022. Felt to be secondary to chronic valvular heart disease, age, and possible COPD. No additional work-up has been recommended for this in the past.   Hypertension History of hypertension but BP was soft last night.  Stable this morning. - Continue to hold home Amlodipine and Atenolol.  AKI Baseline creatinine around 0.8 to 0.9. Creatinine was 1.19 on admission and has been trending up. Increased to 2.51 yesterday evening. - Will repeat BMET this morning. - Avoid nephrotoxic agents.  - Continue to monitor closely.  Hypokalemia Hyperkalemia Potassium was initially mildly low at 3.4 on admission but is now elevated at 5.3 on labs last night in setting of AKI.  - Will repeat BMET this morning. May need a dose of Lokelma.  Otherwise, per primary team: - Aspiration pneumonia - Small bowel obstruction - Lactic acidosis - Chronic anemia - Chronic thrombocytopenia - BPH - Gout - History of CVA   For questions or updates, please contact [facility] Please consult [email or web address] for contact info under        Signed, Corrin Parker, PA-C  02/04/2023, 7:21 AM    I have seen and examined the patient along with Corrin Parker, PA-C .  I  have reviewed the chart, notes and new data.  I agree with PA/NP's note.  Key new complaints: tired, but no angina or dyspnea Key examination changes: there are no clinical signs of hypervolemia on exam. Bradycardiac irregular rhythm, early peaking aortic ejection murmur Key new findings / data: labs this AM show BUN rising and creatinine well above baseline, suggest over-diuresis with rising creatinine. BNP was high at initial eval, but no comparison baseline is available. WBC markedly elevated. Reviewed echo. ECG with pulmonary disease pattern. Atricure clip visible on CXR.  PLAN: The dominant hemodynamic abnormality is moderate pulmonary arterial hypertension (chronic, but worsened) with RV failure, worsened tricuspid insufficiency and elevated RA pressure. The cause of PAH is likely multifactorial. Hard to exclude a component of elevated left atrial pressure (echo not useful due to MV repair and AFib) - would need a right heart cath to evaluate. Clinically, diuretics are making him worse.  Continue to hold atenolol due to excessive bradycardia. No additional diuretics. INR therapeutic (and appendage has been clipped). Antibiotics. F/U on blood cultures, +ve for Gram-variable rod, one bottle only.  Thurmon Fair, MD, [facility] [phone] 02/04/2023, 9:02 AM

## 2023-02-04 NOTE — Progress Notes (Signed)
PROGRESS NOTE    Gerald Lou.  ZOX:[id number] DOB: 09-18-1935 DOA: 02/02/2023 PCP: Gerald Soho, PA-C   Brief Narrative: 88 year old with past medical history significant for dilation of the ascending aorta and aortic root, CAD, A-fib, status post maze procedure, history of CVA, hypertension, aortic valve replacement, status post patent foramen ovale closure, status post mitral valve repair, tricuspid regurgitation, status post tricuspid valve repair, prediabetes, BPH, history of SBO, peripheral neuropathy presents complaining of dyspnea that wake him up.  Reports lower extremity edema for the last few weeks.  He was found to be hypoxic oxygen saturation 85 on room air.  Received IV Solu-Medrol and DuoNebs by EMS.  Evaluation in the ED chest x-ray showed cardiomegaly with diffuse interstitial opacity, negative COVID, influenza and RSV negative, normal white blood cell.  Admitted for heart failure exacerbation received IV Lasix.  Cardiology was consulted.  1/26: Patient vomited, likely Aspirated and we have concern for SBO vs ileus  1/27: was doing better in the morning, afternoon, he remove NG  tube,---also spike fever 102, and Respiratory Distress.   Assessment & Plan:   Principal Problem:   Acute respiratory failure with hypoxia (HCC) Active Problems:   Essential hypertension   Permanent atrial fibrillation (HCC)   CAD (coronary artery disease)   s/p mitral valve repair   Anemia due to chronic blood loss   Benign prostatic hyperplasia with lower urinary tract symptoms   Thrombocytopenia (HCC)   Gout   Hypokalemia   Left hemiparesis (HCC)   S/P AVR (aortic valve replacement)   History of ischemic right MCA stroke   Hyperglycemia   S/P tricuspid valve repair   S/P Maze operation for atrial fibrillation   Prediabetes   HLD (hyperlipidemia)   Other secondary pulmonary hypertension (HCC)   Elevated troponin   Volume overload   1-Acute Hypoxic Respiratory Failure in  the setting of Acute on Chronic Diastolic heart failure exacerbation and Aspiration PNA -Patient presented with shortness of breath and lower extremity edema, chest x-ray: enlargement of the cardiopericardial silhouette with diffuse interstitial opacity suggesting edema.  BNP 432, mild elevation of troponin. -on IV lasix, cardiology following.  -Worsening respiratory distress suspect in setting of aspiration PNA.  -on  Duo-neb, Pulmicort, IV antibiotics.  -He was doing ok this am, breathing was better. Lasix held due to NPO and concern for infection.  Afternoon, develop acute Resp Distress, Tachypnea, Fever 102, after he pull NG tube.  -Plan:  IV tylenol Stat Chest x ray Breathing  treatment.  CCM Consulted.,   2-Aspiration PNA, Sepsis ruled in UTI as well 1/26: He vomited, brown, dark fluid.  Suspect he aspirated. He is more SOB, tachypnea, abdominal distension.  -Develops fever, Leukocytosis, tachypnea, tachycardia. In setting aspiration PNA, and Bacteremia.  -Antibiotics change to Cefepime, blood culture growing enterobacter cloacae.  -Schedule duo-neb, Pulmicort, brovana.  IV antibiotics and fluids.  KUB can't rule out stone. Will check CT renal protocol.   AKI; in setting of sepsis, hypovolemia.  Lasix held.  Urine culture growing gram negative rods. On IV antibiotics.  Check renal US>  X ray : Right renal calculi, largest measuring up to 1.0 cm. One stone could be in the renal pelvic region. This could be better evaluated with CT imaging. Will check CT renal protocol.   Possible SBO; Vs Ileus.  History of same/  Has abdominal distension, vomiting dark brown fluid. No BM in last 3 days, no passing gas.  NG tube placed.  Surgery consulted.  1/27:  KUB: this morning non Obstructive gas pattern.  Patient pull NG tube, surgery recommend not to replaced at this time.   Status post mitral valve replacement Status post AVR replacement Status post tricuspid valve repair meant -On  Coumadin--hold coumadin-- start heparin when INR less than 2 -ECHO; EF 65 % -Cardiology following  Pulmonary hypertension: -Diuretics on hold.   Essential  hypertension: -Hold Norvasc, and atenolol due to hypotension.   Permanent A-fib Status post maze -on atenolol.  -hold coumadin, start heparin when IN is subtherapeutic.    CAD: -hold statins due to NPO  Anemia due to chronic blood loss -Monitor Hb  BPH Lower urinary tract symptoms -on Finasteride.   Thrombocytopenia: -follow trend.   Gout: -Resume allopurinol when taking oral.   Hypokalemia: -replaced.   History of ischemia right MCA stroke Left hemiparesis: -support care  Hyperglycemia, prediabetic -Monitor CBG  Hyperlipidemia: -holding statins.        Estimated body mass index is 22.75 kg/m as calculated from the following:   Height as of this encounter: 6' (1.829 m).   Weight as of this encounter: 76.1 kg.   DVT prophylaxis: coumadin/heparin  Code Status: Full code Family Communication: Care discussed with wife 1/27, updated son 1/26 Disposition Plan:  Status is: Observation The patient will require care spanning > 2 midnights and should be moved to inpatient because: management of presume SBO, aspiration PNA    Consultants:  Cardiology Surgery   Procedures:  ECHO  Antimicrobials:    Subjective: This morning he seem better, alert conversant, he was breathing better. Relates he pass gas.  This afternoon he pull NG tube---then was notice to be more confuse, lethargic, slow and answer questions, was able to say his name and his wife name but very slowly.  More tachypneic respiration rate in the 40s, spiked fever 102.   Objective: Vitals:   02/04/23 0438 02/04/23 0500 02/04/23 0600 02/04/23 0700  BP:  100/62 (!) 100/55 (!) 108/47  Pulse:  65 (!) 56 (!) 52  Resp:  (!) 22 20 (!) 31  Temp:   99.1 F (37.3 C)   TempSrc:   Oral   SpO2:  96% (!) 88% 90%  Weight: 76.1 kg     Height:         Intake/Output Summary (Last 24 hours) at 02/04/2023 0718 Last data filed at 02/04/2023 1610 Gross per 24 hour  Intake 1122.98 ml  Output 995 ml  Net 127.98 ml   Filed Weights   02/02/23 0758 02/03/23 0500 02/04/23 0438  Weight: 79.8 kg 78.6 kg 76.1 kg    Examination:  General exam: Tachypnea, Lethargic Respiratory system: Increase work of breathing, BL crackles.  Cardiovascular system: S 1, S 2 RRR Gastrointestinal system:BS decreased, less distended, no tenderness Central nervous system: lethargic Extremities: no edema  Data Reviewed: I have personally reviewed following labs and imaging studies  CBC: Recent Labs  Lab 02/02/23 0806 02/03/23 0402 02/03/23 2348  WBC 8.6 21.7* 23.1*  NEUTROABS 8.1*  --   --   HGB 10.3* 9.2* 9.4*  HCT 32.5* 28.9* 30.8*  MCV 95.3 94.8 97.2  PLT 128* 114* 73*   Basic Metabolic Panel: Recent Labs  Lab 02/02/23 0806 02/03/23 0402 02/03/23 2348  NA 143 140 145  K 3.4* 4.7 5.3*  CL 110 107 110  CO2 19* 21* 22  GLUCOSE 114* 184* 139*  BUN 22 40* 63*  CREATININE 1.19 1.47* 2.51*  CALCIUM 8.8* 8.7* 8.6*  MG  --  2.3  --  GFR: Estimated Creatinine Clearance: 23.2 mL/min (A) (by C-G formula based on SCr of 2.51 mg/dL (H)). Liver Function Tests: Recent Labs  Lab 02/02/23 0806 02/03/23 0402 02/03/23 2348  AST 29 27 56*  ALT 16 16 23   ALKPHOS 69 40 52  BILITOT 1.3* 1.4* 2.1*  PROT 7.1 6.9 7.2  ALBUMIN 3.6 3.4* 3.4*   No results for input(s): "LIPASE", "AMYLASE" in the last 168 hours. No results for input(s): "AMMONIA" in the last 168 hours. Coagulation Profile: Recent Labs  Lab 02/02/23 0806 02/03/23 0402 02/03/23 2348  INR 2.6* 2.5* 2.5*   Cardiac Enzymes: No results for input(s): "CKTOTAL", "CKMB", "CKMBINDEX", "TROPONINI" in the last 168 hours. BNP (last 3 results) No results for input(s): "PROBNP" in the last 8760 hours. HbA1C: No results for input(s): "HGBA1C" in the last 72 hours. CBG: No results for  input(s): "GLUCAP" in the last 168 hours. Lipid Profile: No results for input(s): "CHOL", "HDL", "LDLCALC", "TRIG", "CHOLHDL", "LDLDIRECT" in the last 72 hours. Thyroid Function Tests: No results for input(s): "TSH", "T4TOTAL", "FREET4", "T3FREE", "THYROIDAB" in the last 72 hours. Anemia Panel: No results for input(s): "VITAMINB12", "FOLATE", "FERRITIN", "TIBC", "IRON", "RETICCTPCT" in the last 72 hours. Sepsis Labs: Recent Labs  Lab 02/03/23 0803 02/03/23 1955 02/03/23 2348  PROCALCITON 42.01  --   --   LATICACIDVEN  --  3.1* 3.2*    Recent Results (from the past 240 hours)  Resp panel by RT-PCR (RSV, Flu A&B, Covid) Anterior Nasal Swab     Status: None   Collection Time: 02/02/23  7:30 AM   Specimen: Anterior Nasal Swab  Result Value Ref Range Status   SARS Coronavirus 2 by RT PCR NEGATIVE NEGATIVE Final    Comment: (NOTE) SARS-CoV-2 target nucleic acids are NOT DETECTED.  The SARS-CoV-2 RNA is generally detectable in upper respiratory specimens during the acute phase of infection. The lowest concentration of SARS-CoV-2 viral copies this assay can detect is 138 copies/mL. A negative result does not preclude SARS-Cov-2 infection and should not be used as the sole basis for treatment or other patient management decisions. A negative result may occur with  improper specimen collection/handling, submission of specimen other than nasopharyngeal swab, presence of viral mutation(s) within the areas targeted by this assay, and inadequate number of viral copies(<138 copies/mL). A negative result must be combined with clinical observations, patient history, and epidemiological information. The expected result is Negative.  Fact Sheet for Patients:  BloggerCourse.com  Fact Sheet for Healthcare Providers:  SeriousBroker.it  This test is no t yet approved or cleared by the [place] FDA and  has been authorized for detection  and/or diagnosis of SARS-CoV-2 by FDA under an Emergency Use Authorization (EUA). This EUA will remain  in effect (meaning this test can be used) for the duration of the COVID-19 declaration under Section 564(b)(1) of the Act, 21 U.S.C.section 360bbb-3(b)(1), unless the authorization is terminated  or revoked sooner.       Influenza A by PCR NEGATIVE NEGATIVE Final   Influenza B by PCR NEGATIVE NEGATIVE Final    Comment: (NOTE) The Xpert Xpress SARS-CoV-2/FLU/RSV plus assay is intended as an aid in the diagnosis of influenza from Nasopharyngeal swab specimens and should not be used as a sole basis for treatment. Nasal washings and aspirates are unacceptable for Xpert Xpress SARS-CoV-2/FLU/RSV testing.  Fact Sheet for Patients: BloggerCourse.com  Fact Sheet for Healthcare Providers: SeriousBroker.it  This test is not yet approved or cleared by the [place] FDA and has been  authorized for detection and/or diagnosis of SARS-CoV-2 by FDA under an Emergency Use Authorization (EUA). This EUA will remain in effect (meaning this test can be used) for the duration of the COVID-19 declaration under Section 564(b)(1) of the Act, 21 U.S.C. section 360bbb-3(b)(1), unless the authorization is terminated or revoked.     Resp Syncytial Virus by PCR NEGATIVE NEGATIVE Final    Comment: (NOTE) Fact Sheet for Patients: BloggerCourse.com  Fact Sheet for Healthcare Providers: SeriousBroker.it  This test is not yet approved or cleared by the [place] FDA and has been authorized for detection and/or diagnosis of SARS-CoV-2 by FDA under an Emergency Use Authorization (EUA). This EUA will remain in effect (meaning this test can be used) for the duration of the COVID-19 declaration under Section 564(b)(1) of the Act, 21 U.S.C. section 360bbb-3(b)(1), unless the authorization is terminated  or revoked.  Performed at [facility], [address], [place], [place] [address]   MRSA Next Gen by PCR, Nasal     Status: None   Collection Time: 02/04/23  1:52 AM   Specimen: Nasal Mucosa; Nasal Swab  Result Value Ref Range Status   MRSA by PCR Next Gen NOT DETECTED NOT DETECTED Final    Comment: (NOTE) The GeneXpert MRSA Assay (FDA approved for NASAL specimens only), is one component of a comprehensive MRSA colonization surveillance program. It is not intended to diagnose MRSA infection nor to guide or monitor treatment for MRSA infections. Test performance is not FDA approved in patients less than 61 years old. Performed at [facility], [address], [place], [place] [address]          Radiology Studies: ECHOCARDIOGRAM COMPLETE Result Date: 02/03/2023    ECHOCARDIOGRAM REPORT   Patient Name:   [name]. Date of Exam: 02/03/2023 Medical Rec #:  [id number]             Height:       72.0 in Accession #:    [id number]            Weight:       173.3 lb Date of Birth:  15-Jun-1935             BSA:          2.005 m Patient Age:    87 years              BP:           91/64 mmHg Patient Gender: M                     HR:           76 bpm. Exam Location:  Inpatient Procedure: 2D Echo, Cardiac Doppler, Color Doppler and Intracardiac            Opacification Agent Indications:    TIA  History:        Patient has prior history of Echocardiogram examinations, most                 recent 03/19/2022. Previous Myocardial Infarction and CAD, COPD,                 Arrythmias:Atrial Fibrillation; Risk Factors:Hypertension.                 Aortic Valve: bioprosthetic valve is present in the aortic                 position.  Mitral Valve: prosthetic annuloplasty ring valve is present in                 the mitral position.  Sonographer:    Webb Laws Referring Phys: [id number] DAVID MANUEL ORTIZ IMPRESSIONS  1. S/P MV repair with mean  gradient 8 mmHg and no MR; s/p AVR with mean gradient 14 mmHg and no AI; s/p TV repair with mild TR.  2. Left ventricular ejection fraction, by estimation, is 60 to 65%. The left ventricle has normal function. The left ventricle has no regional wall motion abnormalities. There is moderate left ventricular hypertrophy. Left ventricular diastolic parameters are indeterminate. There is the interventricular septum is flattened in systole and diastole, consistent with right ventricular pressure and volume overload.  3. Right ventricular systolic function is normal. The right ventricular size is mildly enlarged. There is severely elevated pulmonary artery systolic pressure.  4. Left atrial size was severely dilated.  5. Right atrial size was severely dilated.  6. The mitral valve has been repaired/replaced. No evidence of mitral valve regurgitation. No evidence of mitral stenosis. There is a prosthetic annuloplasty ring present in the mitral position.  7. The tricuspid valve is has been repaired/replaced. The tricuspid valve is status post repair with an annuloplasty ring.  8. The aortic valve has been repaired/replaced. Aortic valve regurgitation is not visualized. No aortic stenosis is present. There is a bioprosthetic valve present in the aortic position.  9. The inferior vena cava is dilated in size with <50% respiratory variability, suggesting right atrial pressure of 15 mmHg. FINDINGS  Left Ventricle: Left ventricular ejection fraction, by estimation, is 60 to 65%. The left ventricle has normal function. The left ventricle has no regional wall motion abnormalities. Definity contrast agent was given IV to delineate the left ventricular  endocardial borders. The left ventricular internal cavity size was normal in size. There is moderate left ventricular hypertrophy. The interventricular septum is flattened in systole and diastole, consistent with right ventricular pressure and volume overload. Left ventricular  diastolic parameters are indeterminate. Right Ventricle: The right ventricular size is mildly enlarged. Right ventricular systolic function is normal. There is severely elevated pulmonary artery systolic pressure. The tricuspid regurgitant velocity is 3.39 m/s, and with an assumed right atrial  pressure of 15 mmHg, the estimated right ventricular systolic pressure is 61.0 mmHg. Left Atrium: Left atrial size was severely dilated. Right Atrium: Right atrial size was severely dilated. Pericardium: There is no evidence of pericardial effusion. Mitral Valve: The mitral valve has been repaired/replaced. No evidence of mitral valve regurgitation. There is a prosthetic annuloplasty ring present in the mitral position. No evidence of mitral valve stenosis. MV peak gradient, 22.5 mmHg. The mean mitral valve gradient is 8.0 mmHg. Tricuspid Valve: The tricuspid valve is has been repaired/replaced. Tricuspid valve regurgitation is mild . No evidence of tricuspid stenosis. The tricuspid valve is status post repair with an annuloplasty ring. Aortic Valve: The aortic valve has been repaired/replaced. Aortic valve regurgitation is not visualized. No aortic stenosis is present. Aortic valve mean gradient measures 14.2 mmHg. Aortic valve peak gradient measures 17.8 mmHg. Aortic valve area, by VTI measures 1.60 cm. There is a bioprosthetic valve present in the aortic position. Pulmonic Valve: The pulmonic valve was normal in structure. Pulmonic valve regurgitation is mild. No evidence of pulmonic stenosis. Aorta: The aortic root is normal in size and structure. Venous: The inferior vena cava is dilated in size with less than 50% respiratory variability, suggesting right atrial pressure  of 15 mmHg. IAS/Shunts: No atrial level shunt detected by color flow Doppler. Additional Comments: S/P MV repair with mean gradient 8 mmHg and no MR; s/p AVR with mean gradient 14 mmHg and no AI; s/p TV repair with mild TR.  LEFT VENTRICLE PLAX 2D  LVIDd:         4.50 cm      Diastology LVIDs:         3.50 cm      LV e' medial:    3.37 cm/s LV PW:         1.20 cm      LV E/e' medial:  59.6 LV IVS:        1.40 cm      LV e' lateral:   7.40 cm/s LVOT diam:     2.10 cm      LV E/e' lateral: 27.2 LV SV:         72 LV SV Index:   36 LVOT Area:     3.46 cm  LV Volumes (MOD) LV vol d, MOD A2C: 120.0 ml LV vol d, MOD A4C: 98.9 ml LV vol s, MOD A2C: 55.1 ml LV vol s, MOD A4C: 41.1 ml LV SV MOD A2C:     64.9 ml LV SV MOD A4C:     98.9 ml LV SV MOD BP:      60.2 ml RIGHT VENTRICLE             IVC RV Basal diam:  4.65 cm     IVC diam: 3.20 cm RV Mid diam:    4.20 cm RV S prime:     10.30 cm/s TAPSE (M-mode): 1.6 cm LEFT ATRIUM              Index        RIGHT ATRIUM           Index LA diam:        4.70 cm  2.34 cm/m   RA Area:     21.10 cm LA Vol (A2C):   141.0 ml 70.34 ml/m  RA Volume:   54.90 ml  27.39 ml/m LA Vol (A4C):   98.7 ml  49.24 ml/m LA Biplane Vol: 120.0 ml 59.86 ml/m  AORTIC VALVE                     PULMONIC VALVE AV Area (Vmax):    1.99 cm      PR End Diast Vel: 1.85 msec AV Area (Vmean):   1.63 cm AV Area (VTI):     1.60 cm AV Vmax:           211.12 cm/s AV Vmean:          176.800 cm/s AV VTI:            0.452 m AV Peak Grad:      17.8 mmHg AV Mean Grad:      14.2 mmHg LVOT Vmax:         121.00 cm/s LVOT Vmean:        83.000 cm/s LVOT VTI:          0.209 m LVOT/AV VTI ratio: 0.46  AORTA Ao Root diam: 3.30 cm Ao Asc diam:  3.50 cm MITRAL VALVE                TRICUSPID VALVE MV Area (PHT): 1.86 cm     TR Peak grad:   46.0 mmHg MV Area VTI:  1.10 cm     TR Vmax:        339.00 cm/s MV Peak grad:  22.5 mmHg MV Mean grad:  8.0 mmHg     SHUNTS MV Vmax:       2.37 m/s     Systemic VTI:  0.21 m MV Vmean:      132.0 cm/s   Systemic Diam: 2.10 cm MV Decel Time: 408 msec MV E velocity: 201.00 cm/s MV A velocity: 55.60 cm/s MV E/A ratio:  3.62 Olga Millers MD Electronically signed by Olga Millers MD Signature Date/Time: 02/03/2023/3:57:16 PM    Final     DG Abd 1 View Result Date: 02/03/2023 CLINICAL DATA:  Nasogastric tube placement. EXAM: ABDOMEN - 1 VIEW COMPARISON:  Earlier today FINDINGS: The enteric tube has been advanced, the tip and side port are below the diaphragm in the stomach. No bowel dilatation in the upper abdomen. Right upper quadrant surgical clips. IMPRESSION: Enteric tube tip and side port below the diaphragm in the stomach. Electronically Signed   By: Narda Rutherford M.D.   On: 02/03/2023 15:50   DG Abd 1 View Result Date: 02/03/2023 CLINICAL DATA:  Nausea and vomiting EXAM: ABDOMEN - 1 VIEW; PORTABLE ABDOMEN - 1 VIEW COMPARISON:  None Available. FINDINGS: Sequential images of the upper abdomen demonstrate placement of an esophagogastric tube, tip below the diaphragm, side port above the gastroesophageal junction. Nonobstructive pattern of included bowel gas. No free air on supine radiographs. Cardiomegaly status post median sternotomy with valvular prosthesis and left atrial appendage clip. IMPRESSION: 1. Sequential images of the upper abdomen demonstrate placement of an esophagogastric tube, tip below the diaphragm, side port above the gastroesophageal junction. Recommend advancement. 2. Nonobstructive pattern of included bowel gas. Electronically Signed   By: Jearld Lesch M.D.   On: 02/03/2023 13:22   DG Abd Portable 1V Result Date: 02/03/2023 CLINICAL DATA:  Nausea and vomiting EXAM: ABDOMEN - 1 VIEW; PORTABLE ABDOMEN - 1 VIEW COMPARISON:  None Available. FINDINGS: Sequential images of the upper abdomen demonstrate placement of an esophagogastric tube, tip below the diaphragm, side port above the gastroesophageal junction. Nonobstructive pattern of included bowel gas. No free air on supine radiographs. Cardiomegaly status post median sternotomy with valvular prosthesis and left atrial appendage clip. IMPRESSION: 1. Sequential images of the upper abdomen demonstrate placement of an esophagogastric tube, tip below the diaphragm,  side port above the gastroesophageal junction. Recommend advancement. 2. Nonobstructive pattern of included bowel gas. Electronically Signed   By: Jearld Lesch M.D.   On: 02/03/2023 13:22   DG CHEST PORT 1 VIEW Result Date: 02/03/2023 CLINICAL DATA:  Hypoxia, status post aortic valve replacement EXAM: PORTABLE CHEST 1 VIEW COMPARISON:  02/02/2023 FINDINGS: Cardiomegaly status post median sternotomy with aortic valve prosthesis and left atrial appendage clip. Similar retrocardiac atelectasis. Mild diffuse interstitial opacity. No acute osseous findings. IMPRESSION: 1. Cardiomegaly status post median sternotomy with aortic valve prosthesis and left atrial appendage clip. 2. Similar retrocardiac atelectasis. Mild diffuse interstitial opacity, consistent with edema. Electronically Signed   By: Jearld Lesch M.D.   On: 02/03/2023 12:22   DG Chest Portable 1 View Result Date: 02/02/2023 CLINICAL DATA:  Shortness of breath. EXAM: PORTABLE CHEST 1 VIEW COMPARISON:  02/16/2022 FINDINGS: The cardio pericardial silhouette is enlarged. Diffuse interstitial opacity suggests edema. No focal lung consolidation. No substantial pleural effusion. Bones are diffusely demineralized. Telemetry leads overlie the chest. IMPRESSION: Enlargement of the cardiopericardial silhouette with diffuse interstitial opacity suggesting edema. Electronically Signed  By: Kennith Center M.D.   On: 02/02/2023 08:21        Scheduled Meds:  allopurinol  150 mg Oral q morning   arformoterol  15 mcg Nebulization BID   budesonide (PULMICORT) nebulizer solution  0.25 mg Nebulization BID   Chlorhexidine Gluconate Cloth  6 each Topical Daily   finasteride  5 mg Oral Daily   furosemide  40 mg Intravenous BID   ipratropium-albuterol  3 mL Nebulization TID   mouth rinse  15 mL Mouth Rinse 4 times per day   pantoprazole (PROTONIX) IV  40 mg Intravenous Q12H   Continuous Infusions:  sodium chloride 10 mL/hr at 02/04/23 0619    ampicillin-sulbactam (UNASYN) IV       LOS: 1 day    Time spent: 35 minutes.     Alba Cory, MD [facility]   If 7PM-7AM, please contact night-coverage [email or web address]  02/04/2023, 7:18 AM

## 2023-02-04 NOTE — Progress Notes (Addendum)
PHARMACY - PHYSICIAN COMMUNICATION CRITICAL VALUE ALERT - BLOOD CULTURE IDENTIFICATION (BCID)  [name]. is an 88 y.o. male who presented to [facility] on 02/02/2023 with a chief complaint of SOB.   Assessment: BCID + 2/4 gram variable rod (both aerobic bottles), identified as enterobacter cloacae complex (NO ESBL resistance detected)  Name of physician (or Provider) Contacted: Dr. Sunnie Nielsen  Current antibiotics: Ampicillin/sulbactam for aspiration PNA  Changes to prescribed antibiotics recommended:  Change to cefepime. For CrCl 24 mL/min, dose is cefepime 2 g IV q24h  Results for orders placed or performed during the hospital encounter of 02/02/23  Blood Culture ID Panel (Reflexed) (Collected: 02/03/2023  5:53 PM)  Result Value Ref Range   Enterococcus faecalis NOT DETECTED NOT DETECTED   Enterococcus Faecium NOT DETECTED NOT DETECTED   Listeria monocytogenes NOT DETECTED NOT DETECTED   Staphylococcus species NOT DETECTED NOT DETECTED   Staphylococcus aureus (BCID) NOT DETECTED NOT DETECTED   Staphylococcus epidermidis NOT DETECTED NOT DETECTED   Staphylococcus lugdunensis NOT DETECTED NOT DETECTED   Streptococcus species NOT DETECTED NOT DETECTED   Streptococcus agalactiae NOT DETECTED NOT DETECTED   Streptococcus pneumoniae NOT DETECTED NOT DETECTED   Streptococcus pyogenes NOT DETECTED NOT DETECTED   A.calcoaceticus-baumannii NOT DETECTED NOT DETECTED   Bacteroides fragilis NOT DETECTED NOT DETECTED   Enterobacterales DETECTED (A) NOT DETECTED   Enterobacter cloacae complex DETECTED (A) NOT DETECTED   Escherichia coli NOT DETECTED NOT DETECTED   Klebsiella aerogenes NOT DETECTED NOT DETECTED   Klebsiella oxytoca NOT DETECTED NOT DETECTED   Klebsiella pneumoniae NOT DETECTED NOT DETECTED   Proteus species NOT DETECTED NOT DETECTED   Salmonella species NOT DETECTED NOT DETECTED   Serratia marcescens NOT DETECTED NOT DETECTED   Haemophilus influenzae NOT DETECTED NOT  DETECTED   Neisseria meningitidis NOT DETECTED NOT DETECTED   Pseudomonas aeruginosa NOT DETECTED NOT DETECTED   Stenotrophomonas maltophilia NOT DETECTED NOT DETECTED   Candida albicans NOT DETECTED NOT DETECTED   Candida auris NOT DETECTED NOT DETECTED   Candida glabrata NOT DETECTED NOT DETECTED   Candida krusei NOT DETECTED NOT DETECTED   Candida parapsilosis NOT DETECTED NOT DETECTED   Candida tropicalis NOT DETECTED NOT DETECTED   Cryptococcus neoformans/gattii NOT DETECTED NOT DETECTED   CTX-M ESBL NOT DETECTED NOT DETECTED   Carbapenem resistance IMP NOT DETECTED NOT DETECTED   Carbapenem resistance KPC NOT DETECTED NOT DETECTED   Carbapenem resistance NDM NOT DETECTED NOT DETECTED   Carbapenem resist OXA 48 LIKE NOT DETECTED NOT DETECTED   Carbapenem resistance VIM NOT DETECTED NOT DETECTED    Cindi Carbon, PharmD 02/04/2023  9:26 AM  Addendum: MD broadening antibiotic coverage to meropenem.   CrCl ~25 mL/min, initiate meropenem 1 g IV q12h and follow up SCr with AM labs tomorrow.   Cindi Carbon, PharmD 02/04/23 3:00 PM

## 2023-02-04 NOTE — Progress Notes (Signed)
PCCM Update:  Patient maxed out on peripheral levophed. Spoke with patient's son, wife and care taker at the bedside. We reviewed the patient's wishes and that we are out of options in regard to helping with his septic shock. We discussed central line placement which family agreed he would not not to escalate vasopressor support. I discussed the next steps of comfort care and that orders will be placed to have medications available if needed. Ice chips and water swabs available as well per patient request for comfort.   Melody Comas, MD [facility] Office: [phone]   See Amion for personal pager PCCM on call pager [phone] until 7pm. Please call Elink 7p-7a. [phone]

## 2023-02-04 NOTE — IPAL (Signed)
  Interdisciplinary Goals of Care Family Meeting   Date carried out: 02/04/2023  Location of the meeting: Phone conference  Member's involved: Family Member or next of kin (son Scientific laboratory technician) and Advice worker  Durable Power of Pensions consultant or acting medical decision maker: Scientific laboratory technician, son.    Discussion: We discussed goals of care for [name]. .  I have had an extensive discussion with [name]. We discussed [name]'s current circumstances and prior wishes regarding his healthcare. Annette Stable has paperwork in place that states he would not want to undergo heroic measures in the event of an arrest if it were deemed to not provide him meaningful benefit or to cause more harm. I discussed Bills age and comorbidities with Trinna Post and together we agreed that things like mechanical ventilation and CPR would certainly cause suffering and pose further risks. [name] agrees that we should continue all supportive measures as able but to place limitations of DNR/DNI in place in the event of arrest.  Trinna Post will be visiting the hospital this afternoon and I informed him we will be available for further discussions if needed after he arrives. Primary team updated on plan of care.   Code status:   Code Status: Limited: Do not attempt resuscitation (DNR) -DNR-LIMITED -Do Not Intubate/DNI    Disposition: Continue current acute care  Time spent for the meeting: 15 minutes.    Rutherford Guys, PA-C  02/04/2023, 3:03 PM

## 2023-02-04 NOTE — Progress Notes (Signed)
PCCM Brief Note   Hypotension with MAP low 50s. Will order 1 dose 25% Albumin as well as low dose NE via PIV. Check cortisol.  Will update family.    Rutherford Guys, PA - C [facility] For pager details, please see AMION or use Epic chat  After 1900, please call [facility] for cross coverage needs 02/04/2023, 4:17 PM

## 2023-02-04 NOTE — Progress Notes (Signed)
Progress Note     Subjective: Denies significant complaints this am - asking for ice chips. He is passing flatus and states nausea and abdominal pain have resolved since NGT placement. Last BM was prior to admission  Objective: Vital signs in last 24 hours: Temp:  [98.3 F (36.8 C)-103.8 F (39.9 C)] 99.1 F (37.3 C) (01/27 0600) Pulse Rate:  [52-110] 52 (01/27 0700) Resp:  [20-40] 31 (01/27 0700) BP: (90-153)/(34-83) 108/47 (01/27 0700) SpO2:  [88 %-100 %] 90 % (01/27 0700) Weight:  [76.1 kg] 76.1 kg (01/27 0438) Last BM Date : 02/01/23  Intake/Output from previous day: 01/26 0701 - 01/27 0700 In: 1123 [I.V.:103.2; IV Piggyback:1019.8] Out: 995 [Urine:925; Emesis/NG output:70] Intake/Output this shift: No intake/output data recorded.  PE: General: pleasant, ill appearing, WD, male who is laying in bed in NAD Lungs: Respiratory effort nonlabored on supplemental O2 via [place] Abd: soft, moderately distended. NT. Well healed midline surgical scar with soft incisional hernia. NGT with small volume bilious output MSK: trace edema BLE Skin: warm and dry Psych: A&Ox3 with an appropriate affect.    Lab Results:  Recent Labs    02/03/23 0402 02/03/23 2348  WBC 21.7* 23.1*  HGB 9.2* 9.4*  HCT 28.9* 30.8*  PLT 114* 73*   BMET Recent Labs    02/03/23 0402 02/03/23 2348  NA 140 145  K 4.7 5.3*  CL 107 110  CO2 21* 22  GLUCOSE 184* 139*  BUN 40* 63*  CREATININE 1.47* 2.51*  CALCIUM 8.7* 8.6*   PT/INR Recent Labs    02/03/23 0402 02/03/23 2348  LABPROT 27.2* 27.6*  INR 2.5* 2.5*   CMP     Component Value Date/Time   NA 145 02/03/2023 2348   NA 143 09/04/2022 1421   K 5.3 (H) 02/03/2023 2348   CL 110 02/03/2023 2348   CO2 22 02/03/2023 2348   GLUCOSE 139 (H) 02/03/2023 2348   BUN 63 (H) 02/03/2023 2348   BUN 13 09/04/2022 1421   CREATININE 2.51 (H) 02/03/2023 2348   CALCIUM 8.6 (L) 02/03/2023 2348   PROT 7.2 02/03/2023 2348   PROT 7.2 12/20/2020 1011    ALBUMIN 3.4 (L) 02/03/2023 2348   ALBUMIN 4.4 12/20/2020 1011   AST 56 (H) 02/03/2023 2348   ALT 23 02/03/2023 2348   ALKPHOS 52 02/03/2023 2348   BILITOT 2.1 (H) 02/03/2023 2348   BILITOT 0.5 12/20/2020 1011   GFRNONAA 24 (L) 02/03/2023 2348   GFRAA 83 11/25/2019 1040   Lipase     Component Value Date/Time   LIPASE 20 09/17/2009 2214       Studies/Results: ECHOCARDIOGRAM COMPLETE Result Date: 02/03/2023    ECHOCARDIOGRAM REPORT   Patient Name:   [name]. Date of Exam: 02/03/2023 Medical Rec #:  [id number]             Height:       72.0 in Accession #:    [id number]            Weight:       173.3 lb Date of Birth:  30-Dec-1935             BSA:          2.005 m Patient Age:    87 years              BP:           91/64 mmHg Patient Gender: M  HR:           76 bpm. Exam Location:  Inpatient Procedure: 2D Echo, Cardiac Doppler, Color Doppler and Intracardiac            Opacification Agent Indications:    TIA  History:        Patient has prior history of Echocardiogram examinations, most                 recent 03/19/2022. Previous Myocardial Infarction and CAD, COPD,                 Arrythmias:Atrial Fibrillation; Risk Factors:Hypertension.                 Aortic Valve: bioprosthetic valve is present in the aortic                 position.                 Mitral Valve: prosthetic annuloplasty ring valve is present in                 the mitral position.  Sonographer:    Webb Laws Referring Phys: [id number] DAVID MANUEL ORTIZ IMPRESSIONS  1. S/P MV repair with mean gradient 8 mmHg and no MR; s/p AVR with mean gradient 14 mmHg and no AI; s/p TV repair with mild TR.  2. Left ventricular ejection fraction, by estimation, is 60 to 65%. The left ventricle has normal function. The left ventricle has no regional wall motion abnormalities. There is moderate left ventricular hypertrophy. Left ventricular diastolic parameters are indeterminate. There is the interventricular  septum is flattened in systole and diastole, consistent with right ventricular pressure and volume overload.  3. Right ventricular systolic function is normal. The right ventricular size is mildly enlarged. There is severely elevated pulmonary artery systolic pressure.  4. Left atrial size was severely dilated.  5. Right atrial size was severely dilated.  6. The mitral valve has been repaired/replaced. No evidence of mitral valve regurgitation. No evidence of mitral stenosis. There is a prosthetic annuloplasty ring present in the mitral position.  7. The tricuspid valve is has been repaired/replaced. The tricuspid valve is status post repair with an annuloplasty ring.  8. The aortic valve has been repaired/replaced. Aortic valve regurgitation is not visualized. No aortic stenosis is present. There is a bioprosthetic valve present in the aortic position.  9. The inferior vena cava is dilated in size with <50% respiratory variability, suggesting right atrial pressure of 15 mmHg. FINDINGS  Left Ventricle: Left ventricular ejection fraction, by estimation, is 60 to 65%. The left ventricle has normal function. The left ventricle has no regional wall motion abnormalities. Definity contrast agent was given IV to delineate the left ventricular  endocardial borders. The left ventricular internal cavity size was normal in size. There is moderate left ventricular hypertrophy. The interventricular septum is flattened in systole and diastole, consistent with right ventricular pressure and volume overload. Left ventricular diastolic parameters are indeterminate. Right Ventricle: The right ventricular size is mildly enlarged. Right ventricular systolic function is normal. There is severely elevated pulmonary artery systolic pressure. The tricuspid regurgitant velocity is 3.39 m/s, and with an assumed right atrial  pressure of 15 mmHg, the estimated right ventricular systolic pressure is 61.0 mmHg. Left Atrium: Left atrial size was  severely dilated. Right Atrium: Right atrial size was severely dilated. Pericardium: There is no evidence of pericardial effusion. Mitral Valve: The mitral valve has been repaired/replaced. No evidence of  mitral valve regurgitation. There is a prosthetic annuloplasty ring present in the mitral position. No evidence of mitral valve stenosis. MV peak gradient, 22.5 mmHg. The mean mitral valve gradient is 8.0 mmHg. Tricuspid Valve: The tricuspid valve is has been repaired/replaced. Tricuspid valve regurgitation is mild . No evidence of tricuspid stenosis. The tricuspid valve is status post repair with an annuloplasty ring. Aortic Valve: The aortic valve has been repaired/replaced. Aortic valve regurgitation is not visualized. No aortic stenosis is present. Aortic valve mean gradient measures 14.2 mmHg. Aortic valve peak gradient measures 17.8 mmHg. Aortic valve area, by VTI measures 1.60 cm. There is a bioprosthetic valve present in the aortic position. Pulmonic Valve: The pulmonic valve was normal in structure. Pulmonic valve regurgitation is mild. No evidence of pulmonic stenosis. Aorta: The aortic root is normal in size and structure. Venous: The inferior vena cava is dilated in size with less than 50% respiratory variability, suggesting right atrial pressure of 15 mmHg. IAS/Shunts: No atrial level shunt detected by color flow Doppler. Additional Comments: S/P MV repair with mean gradient 8 mmHg and no MR; s/p AVR with mean gradient 14 mmHg and no AI; s/p TV repair with mild TR.  LEFT VENTRICLE PLAX 2D LVIDd:         4.50 cm      Diastology LVIDs:         3.50 cm      LV e' medial:    3.37 cm/s LV PW:         1.20 cm      LV E/e' medial:  59.6 LV IVS:        1.40 cm      LV e' lateral:   7.40 cm/s LVOT diam:     2.10 cm      LV E/e' lateral: 27.2 LV SV:         72 LV SV Index:   36 LVOT Area:     3.46 cm  LV Volumes (MOD) LV vol d, MOD A2C: 120.0 ml LV vol d, MOD A4C: 98.9 ml LV vol s, MOD A2C: 55.1 ml LV vol s,  MOD A4C: 41.1 ml LV SV MOD A2C:     64.9 ml LV SV MOD A4C:     98.9 ml LV SV MOD BP:      60.2 ml RIGHT VENTRICLE             IVC RV Basal diam:  4.65 cm     IVC diam: 3.20 cm RV Mid diam:    4.20 cm RV S prime:     10.30 cm/s TAPSE (M-mode): 1.6 cm LEFT ATRIUM              Index        RIGHT ATRIUM           Index LA diam:        4.70 cm  2.34 cm/m   RA Area:     21.10 cm LA Vol (A2C):   141.0 ml 70.34 ml/m  RA Volume:   54.90 ml  27.39 ml/m LA Vol (A4C):   98.7 ml  49.24 ml/m LA Biplane Vol: 120.0 ml 59.86 ml/m  AORTIC VALVE                     PULMONIC VALVE AV Area (Vmax):    1.99 cm      PR End Diast Vel: 1.85 msec AV Area (Vmean):   1.63 cm AV  Area (VTI):     1.60 cm AV Vmax:           211.12 cm/s AV Vmean:          176.800 cm/s AV VTI:            0.452 m AV Peak Grad:      17.8 mmHg AV Mean Grad:      14.2 mmHg LVOT Vmax:         121.00 cm/s LVOT Vmean:        83.000 cm/s LVOT VTI:          0.209 m LVOT/AV VTI ratio: 0.46  AORTA Ao Root diam: 3.30 cm Ao Asc diam:  3.50 cm MITRAL VALVE                TRICUSPID VALVE MV Area (PHT): 1.86 cm     TR Peak grad:   46.0 mmHg MV Area VTI:   1.10 cm     TR Vmax:        339.00 cm/s MV Peak grad:  22.5 mmHg MV Mean grad:  8.0 mmHg     SHUNTS MV Vmax:       2.37 m/s     Systemic VTI:  0.21 m MV Vmean:      132.0 cm/s   Systemic Diam: 2.10 cm MV Decel Time: 408 msec MV E velocity: 201.00 cm/s MV A velocity: 55.60 cm/s MV E/A ratio:  3.62 Olga Millers MD Electronically signed by Olga Millers MD Signature Date/Time: 02/03/2023/3:57:16 PM    Final    DG Abd 1 View Result Date: 02/03/2023 CLINICAL DATA:  Nasogastric tube placement. EXAM: ABDOMEN - 1 VIEW COMPARISON:  Earlier today FINDINGS: The enteric tube has been advanced, the tip and side port are below the diaphragm in the stomach. No bowel dilatation in the upper abdomen. Right upper quadrant surgical clips. IMPRESSION: Enteric tube tip and side port below the diaphragm in the stomach. Electronically  Signed   By: Narda Rutherford M.D.   On: 02/03/2023 15:50   DG Abd 1 View Result Date: 02/03/2023 CLINICAL DATA:  Nausea and vomiting EXAM: ABDOMEN - 1 VIEW; PORTABLE ABDOMEN - 1 VIEW COMPARISON:  None Available. FINDINGS: Sequential images of the upper abdomen demonstrate placement of an esophagogastric tube, tip below the diaphragm, side port above the gastroesophageal junction. Nonobstructive pattern of included bowel gas. No free air on supine radiographs. Cardiomegaly status post median sternotomy with valvular prosthesis and left atrial appendage clip. IMPRESSION: 1. Sequential images of the upper abdomen demonstrate placement of an esophagogastric tube, tip below the diaphragm, side port above the gastroesophageal junction. Recommend advancement. 2. Nonobstructive pattern of included bowel gas. Electronically Signed   By: Jearld Lesch M.D.   On: 02/03/2023 13:22   DG Abd Portable 1V Result Date: 02/03/2023 CLINICAL DATA:  Nausea and vomiting EXAM: ABDOMEN - 1 VIEW; PORTABLE ABDOMEN - 1 VIEW COMPARISON:  None Available. FINDINGS: Sequential images of the upper abdomen demonstrate placement of an esophagogastric tube, tip below the diaphragm, side port above the gastroesophageal junction. Nonobstructive pattern of included bowel gas. No free air on supine radiographs. Cardiomegaly status post median sternotomy with valvular prosthesis and left atrial appendage clip. IMPRESSION: 1. Sequential images of the upper abdomen demonstrate placement of an esophagogastric tube, tip below the diaphragm, side port above the gastroesophageal junction. Recommend advancement. 2. Nonobstructive pattern of included bowel gas. Electronically Signed   By: Jearld Lesch M.D.   On: 02/03/2023 13:22  DG CHEST PORT 1 VIEW Result Date: 02/03/2023 CLINICAL DATA:  Hypoxia, status post aortic valve replacement EXAM: PORTABLE CHEST 1 VIEW COMPARISON:  02/02/2023 FINDINGS: Cardiomegaly status post median sternotomy with aortic  valve prosthesis and left atrial appendage clip. Similar retrocardiac atelectasis. Mild diffuse interstitial opacity. No acute osseous findings. IMPRESSION: 1. Cardiomegaly status post median sternotomy with aortic valve prosthesis and left atrial appendage clip. 2. Similar retrocardiac atelectasis. Mild diffuse interstitial opacity, consistent with edema. Electronically Signed   By: Jearld Lesch M.D.   On: 02/03/2023 12:22    Anti-infectives: Anti-infectives (From admission, onward)    Start     Dose/Rate Route Frequency Ordered Stop   02/04/23 1200  Ampicillin-Sulbactam (UNASYN) 3 g in sodium chloride 0.9 % 100 mL IVPB        3 g 200 mL/hr over 30 Minutes Intravenous Every 12 hours 02/04/23 0203     02/03/23 1200  Ampicillin-Sulbactam (UNASYN) 3 g in sodium chloride 0.9 % 100 mL IVPB  Status:  Discontinued        3 g 200 mL/hr over 30 Minutes Intravenous Every 6 hours 02/03/23 1115 02/04/23 0203        Assessment/Plan SBO vs ileus H/o ex lap cholecystectomy  - febrile to 103.8 ON. WBC elevated to 23.7. Bcx with gram variable rods - UCX pending - NGT low output - 70 ml/24h bilious. Xray this am with nonobstructive bowel gas pattern - distended but abdominal exam overall reassuring and emergent surgical intervention not indicated - can clamp NGT this am and sip on clears. Possible NGT removal this pm  FEN: clamp NGT, sips of clears ID: [email or web address], cefepime VTE: held in setting of thrombocytopenia  Per primary Aspiration PNA CHF exacerbation S/p MVR, AVR, TVR - on coumadin at baseline. Held Pulm HTN CAD BPH Gout Thrombocytopenia H/o strok - L hemiparesis  I reviewed hospitalist notes, last 24 h vitals and pain scores, last 48 h intake and output, last 24 h labs and trends, and last 24 h imaging results.     LOS: 1 day   Eric Form, [facility] Surgery 02/04/2023, 8:33 AM Please see Amion for pager number during day hours 7:00am-4:30pm

## 2023-02-04 NOTE — Plan of Care (Signed)
  Problem: Education: Goal: Ability to demonstrate management of disease process will improve Outcome: Not Progressing   Problem: Activity: Goal: Capacity to carry out activities will improve Outcome: Not Progressing   Problem: Cardiac: Goal: Ability to achieve and maintain adequate cardiopulmonary perfusion will improve Outcome: Not Progressing   Problem: Nutrition: Goal: Adequate nutrition will be maintained Outcome: Not Progressing

## 2023-02-04 NOTE — Consult Note (Signed)
NAME:  [name]., MRN:  [id number], DOB:  12/01/1935, LOS: 1 ADMISSION DATE:  02/02/2023, CONSULTATION DATE:  02/04/23 REFERRING MD:  Sunnie Nielsen CHIEF COMPLAINT:  Dyspnea   History of Present Illness:  Bravlio Luca. is a 88 y.o. male who has an extensive PMH as below. He presented to [facility] ED 1/25 with dyspnea that woke him from his sleep the night prior. He had also complained of LE edema over the preceding few weeks. On EMS arrival, he had sats of 85% on room air. He was placed on [place] O2 and given Solumedrol and DuoNebs in route to ED>  In ED, CXR was suggestive of mild edema. He was admitted by [facility] and started on diuresis. He was seen by cardiology in consultation who agreed with diuresis for fluid overload and obtaining an echo. Echo was obtained and demonstrated EF 60-65%, mod LVH, RV pressure overload with RVSP 61, LA and RA severely dilated.  On evening of 1/26, he developed nausea and vomiting with probable aspiration event. There was concern for SBO and was seen by general surgery who recommended continuing NPO and continuing NGT to suction.  On 1/27, diuresis was held due to bump in renal function after IV lasix. He was only net negative since admit. He did not appear to have excess volume per cardiology notes and appeared improved from CHF standpoint. Dyspnea and hypoxia were felt to more likely be related to aspiration PNA. General surgery had also seen and he had benign exam and was passing flatus. He unfortunately pulled out his NGT that day.  After pulling out NGT 1/27, he had episode of tachypnea and increased WOB along with spike in temp to 102.76F. Due to increased WOB, PCCM asked to see in consultation. He had been on 4L [place] prior to this episode. Blood cultures from admission also returned positive for Enterobacter cloacae. He was on Cefepime but after discussion with TRH, we will escalate to Meropenem.  On our arrival, he appears tachypneic but subjectively  feels that breathing is "OK". He appears globally weak. His wife and brother at bedside along with caregiver. They tell me that he had been aspirating on food for at least a week prior to his admit and complained of "food going down the wrong pipe" followed by coughing spells. Denies ever being told he had dysphagia and needed a specific diet.  I called pt's son Trinna Post who is HCPOA and discussed code status and goals of care. See separate IPAL note but in short, Pt had made it clear in recent times that if he were hospitalized and had a condition that was not reversible or had a poor prognosis, he would not want to prolong things or be placed on life support devices. While I explained to [facility] that while antibiotics should help with his aspiration and bacteremia, I was concerned with his work of breathing and him fatiguing. We agreed that if he reached the point of needing mechanical ventilation, the procedure itself would pose risks including hypotension/shock and possible cardiac arrest given his cardiac history with severe PAH etc. We also agreed that CPR would certainly cause more harm and suffering and would not be in line with pt's wishes. In light of this, we agreed to place DNR/DNI orders on the chart and to continue with ongoing supportive measures in hopes that with time, abx and supportive care will be enough to get pt back to his prior state of health.  Pertinent  Medical  History:  has Essential hypertension; Gout; History of small bowel obstruction; History of BPH; S/P AVR; Hypokalemia; Left hemiparesis (HCC); Dysphagia, post-stroke; S/P AVR (aortic valve replacement); Benign prostatic hyperplasia with lower urinary tract symptoms; History of ischemic right MCA stroke; Permanent atrial fibrillation (HCC); Hyperglycemia; Mitral valve regurgitation; Tricuspid regurgitation; s/p mitral valve repair; S/P tricuspid valve repair; S/P Maze operation for atrial fibrillation; Long term current use of  anticoagulant; Anemia; Anemia due to chronic blood loss; Acquired dilation of ascending aorta and aortic root (HCC); Chronic congestive heart failure (HCC); Elevated PSA; History of adenomatous polyp of colon; History of anemia; Hypercoagulable state (HCC); Impairment of balance; Peripheral neuropathy; Personal history of other malignant neoplasm of skin; Prediabetes; Presence of prosthetic heart valve; Sleep disorder; Thrombocytopenia (HCC); Vitamin D deficiency; Hypertrophy of prostate with urinary obstruction and other lower urinary tract symptoms (LUTS); Closed left hip fracture, initial encounter (HCC); Malnutrition of moderate degree; S/P left total hip arthroplasty; CAD (coronary artery disease); Cerebrovascular accident (CVA) (HCC); SIRS (systemic inflammatory response syndrome) (HCC); Generalized weakness; Otitis externa; Valvular heart disease; Hypoalbuminemia due to protein-calorie malnutrition (HCC); Abnormal findings on diagnostic imaging of other specified body structures; S/P total knee arthroplasty, left; Symptomatic anemia; HLD (hyperlipidemia); Hematuria; Weakness; Urinary catheter in place; COVID-19 virus infection; Other secondary pulmonary hypertension (HCC); Preoperative cardiovascular examination; Gait abnormality; Bilateral foot-drop; S/P total knee arthroplasty, right; Acute respiratory failure with hypoxia (HCC); Elevated troponin; and Volume overload on their problem list.  Significant Hospital Events: Including procedures, antibiotic start and stop dates in addition to other pertinent events   1/25 admit 1/27 pulled out NGT and had episode of respiratory distress along with spike in temp to 102.43F. PCCM consulted. 1/27 Blood cultures returned positive for Enterobacter cloacae.  Interim History / Subjective:  On 4L [place]. Denies being dyspneic but visibly tachypneic with shallow inspirations and mild distress. Family at bedside.  Objective:  Blood pressure (!) 103/54, pulse 88,  temperature (!) 102.6 F (39.2 C), temperature source Axillary, resp. rate 18, height 6' (1.829 m), weight 76.1 kg, SpO2 95%.    FiO2 (%):  [40 %] 40 %   Intake/Output Summary (Last 24 hours) at 02/04/2023 1534 Last data filed at 02/04/2023 1218 Gross per 24 hour  Intake 1122.98 ml  Output 325 ml  Net 797.98 ml   Filed Weights   02/02/23 0758 02/03/23 0500 02/04/23 0438  Weight: 79.8 kg 78.6 kg 76.1 kg    Examination: General: Elderly male, chronically ill appearing, in mild distress. Neuro: Awake, alert, slow to respond but will respond to questions and follow commands. HEENT: [place]/AT. Sclerae anicteric. EOMI. Cardiovascular: IRIR, 3/6 SEM.  Lungs: Respirations shallow and rapid. Diminished. Abdomen: BS x 4, soft, NT/ND.  Musculoskeletal: Some muscle wasting. No gross deformities, no edema.  Skin: Intact, warm, no rashes.  Labs/imaging personally reviewed:  Echo 1/26 > EF 60-65%, mod LVH, RV pressure overload with RVSP 61, LA and RA severely dilated CT chest 1/27 >  CT renal 1/27 >   Assessment & Plan:   Acute hypoxic respiratory failure - multifactorial in setting aspiration PNA, fluid overload/CHF exacerbation, atelectasis. - Will place on HHFNC with higher flow rates to help alleviate some of his work of breathing (doesn't necessarily need higher FiO2). - Avoiding BiPAP given recent vomiting and aspiration. - Change abx from Cefepime to Meropenem. - F/u on CT chest. - NPO. - Add hypertonic nebs and scheduled DuoNebs. - PT when able, likely defer at least until tomorrow 1/28. - SLP eval once able.  Enterobacter cloacae bacteremia - possibly 2/2 UTI. - Change abx from Cefepime to Meropenem.  Possible SBO vs Ileus - had NGT but pt removed this 1/27. - Supportive care, is passing flatus currently so hopeful this is resolving. - Keep NPO for now.  AKI  - worsened after IV diuresis. Possible renal calculi - F/u on CT renal study. - Hold further diuresis for now. -  Follow BMP. Goals of care. - Continue supportive care including abx, fluids, antipyretics etc. - DNR/DNI in the event of arrest. - If declines further, family aware they may be approached to consider comfort measures per pt's prior wishes.  Rest per primary team. PCCM will check in on pt again this afternoon and in AM 1/28.   Best practice (evaluated daily):  Per primary.  Labs   CBC: Recent Labs  Lab 02/02/23 0806 02/03/23 0402 02/03/23 2348 02/04/23 0814  WBC 8.6 21.7* 23.1* 23.7*  NEUTROABS 8.1*  --   --   --   HGB 10.3* 9.2* 9.4* 8.8*  HCT 32.5* 28.9* 30.8* 28.1*  MCV 95.3 94.8 97.2 94.0  PLT 128* 114* 73* 61*    Basic Metabolic Panel: Recent Labs  Lab 02/02/23 0806 02/03/23 0402 02/03/23 2348 02/04/23 0814  NA 143 140 145 146*  K 3.4* 4.7 5.3* 4.6  CL 110 107 110 111  CO2 19* 21* 22 22  GLUCOSE 114* 184* 139* 145*  BUN 22 40* 63* 72*  CREATININE 1.19 1.47* 2.51* 2.39*  CALCIUM 8.8* 8.7* 8.6* 8.3*  MG  --  2.3  --   --    GFR: Estimated Creatinine Clearance: 24.3 mL/min (A) (by C-G formula based on SCr of 2.39 mg/dL (H)). Recent Labs  Lab 02/02/23 0806 02/03/23 0402 02/03/23 0803 02/03/23 1955 02/03/23 2348 02/04/23 0814  PROCALCITON  --   --  42.01  --   --   --   WBC 8.6 21.7*  --   --  23.1* 23.7*  LATICACIDVEN  --   --   --  3.1* 3.2*  --     Liver Function Tests: Recent Labs  Lab 02/02/23 0806 02/03/23 0402 02/03/23 2348  AST 29 27 56*  ALT 16 16 23   ALKPHOS 69 40 52  BILITOT 1.3* 1.4* 2.1*  PROT 7.1 6.9 7.2  ALBUMIN 3.6 3.4* 3.4*   No results for input(s): "LIPASE", "AMYLASE" in the last 168 hours. No results for input(s): "AMMONIA" in the last 168 hours.  ABG    Component Value Date/Time   PHART 7.349 (L) 01/31/2018 1610   PCO2ART 43.6 01/31/2018 1610   PO2ART 29.0 (LL) 01/31/2018 1610   HCO3 24.1 01/31/2018 1610   TCO2 23 05/21/2021 1356   ACIDBASEDEF 2.0 01/31/2018 1610   O2SAT 53.0 01/31/2018 1610     Coagulation  Profile: Recent Labs  Lab 02/02/23 0806 02/03/23 0402 02/03/23 2348 02/04/23 0741  INR 2.6* 2.5* 2.5* 2.6*    Cardiac Enzymes: No results for input(s): "CKTOTAL", "CKMB", "CKMBINDEX", "TROPONINI" in the last 168 hours.  HbA1C: Hgb A1c MFr Bld  Date/Time Value Ref Range Status  09/24/2021 12:58 AM 5.5 4.8 - 5.6 % Final    Comment:    (NOTE) Pre diabetes:          5.7%-6.4%  Diabetes:              >6.4%  Glycemic control for   <7.0% adults with diabetes   05/23/2021 02:04 AM 5.7 (H) 4.8 - 5.6 % Final  Comment:    (NOTE) Pre diabetes:          5.7%-6.4%  Diabetes:              >6.4%  Glycemic control for   <7.0% adults with diabetes     CBG: Recent Labs  Lab 02/03/23 1918 02/04/23 0759 02/04/23 1118  GLUCAP 126* 143* 132*    Review of Systems:   All negative; except for those that are bolded, which indicate positives.  Constitutional: weight loss, weight gain, night sweats, fevers, chills, fatigue, weakness.  HEENT: headaches, sore throat, sneezing, nasal congestion, post nasal drip, difficulty swallowing, tooth/dental problems, visual complaints, visual changes, ear aches. Neuro: difficulty with speech, weakness, numbness, ataxia. CV:  chest pain, orthopnea, PND, swelling in lower extremities, dizziness, palpitations, syncope.  Resp: cough, hemoptysis, dyspnea, wheezing. GI: heartburn, indigestion, abdominal pain, nausea, vomiting, diarrhea, constipation, change in bowel habits, loss of appetite, hematemesis, melena, hematochezia.  GU: dysuria, change in color of urine, urgency or frequency, flank pain, hematuria. MSK: joint pain or swelling, decreased range of motion. Psych: change in mood or affect, depression, anxiety, suicidal ideations, homicidal ideations. Skin: rash, itching, bruising.   Past Medical History:  He,  has a past medical history of Acquired dilation of ascending aorta and aortic root (HCC), Arthritis, Bladder stones, Borderline  diabetes, BPH (benign prostatic hyperplasia), CAD (coronary artery disease) (12/19/2020), Complication of anesthesia, Coronary artery disease, CVA (cerebral vascular accident) (HCC) (10/24/2016), Diverticulosis of colon, Dysrhythmia, Gout, Heart murmur, History of adenomatous polyp of colon, History of aortic insufficiency, History of small bowel obstruction, History of urinary retention, HTN (hypertension), Other secondary pulmonary hypertension (HCC) (03/20/2022), Peripheral neuropathy, Persistent atrial fibrillation (HCC), Pre-diabetes, S/P aortic valve replacement with prosthetic valve (08/03/2010), S/P Maze operation for atrial fibrillation (01/30/2018), S/P MVR (mitral valve repair) (01/30/2018), S/P patent foramen ovale closure (08/03/2010), S/P tricuspid valve repair (01/30/2018), Stroke (HCC), Thrombocytopenia (HCC), and Tricuspid regurgitation.   Surgical History:   Past Surgical History:  Procedure Laterality Date   BIOPSY  09/07/2018   Procedure: BIOPSY;  Surgeon: Kathi Der, MD;  Location: [facility];  Service: Gastroenterology;;   CARDIAC CATHETERIZATION  06-02-2010  dr Anne Fu   non-obstructive cad- pLAD 30-40%/  normal LVSF/  severe AI   CARDIOVASCULAR STRESS TEST  04/12/2016   Low risk nuclear perfusion study w/ no significant reversible ischemia/  normal LV function and wall motion ,  stress ef 60%/  2mm inferior and lateral scooped ST-segment depression w/ exercise (may be repolarization abnormality), exercise capacity was moderately reduced   CATARACT EXTRACTION W/ INTRAOCULAR LENS  IMPLANT, BILATERAL  02/2010   CLIPPING OF ATRIAL APPENDAGE N/A 01/30/2018   Procedure: CLIPPING OF LEFT ATRIAL APPENDAGE USING ATRICLIP PRO2 ;  Surgeon: Purcell Nails, MD;  Location: [facility] OR;  Service: Open Heart Surgery;  Laterality: N/A;   COLONOSCOPY WITH PROPOFOL N/A 10/21/2018   Procedure: COLONOSCOPY WITH PROPOFOL;  Surgeon: Kathi Der, MD;  Location: [facility];  Service:  Gastroenterology;  Laterality: N/A;   CYSTOSCOPY WITH LITHOLAPAXY N/A 06/05/2016   Procedure: CYSTOSCOPY WITH LITHOLAPAXY and fulgarization of bladder neck;  Surgeon: Bjorn Pippin, MD;  Location: [facility];  Service: Urology;  Laterality: N/A;   ESOPHAGOGASTRODUODENOSCOPY (EGD) WITH PROPOFOL N/A 09/07/2018   Procedure: ESOPHAGOGASTRODUODENOSCOPY (EGD) WITH PROPOFOL;  Surgeon: Kathi Der, MD;  Location: [facility];  Service: Gastroenterology;  Laterality: N/A;   EXPLORATORY LAPARTOMY /  CHOLECYSTECTOMY  02/28/2005   for Small  bowel obstruction (mechnical)   IR ANGIO EXTRACRAN  SEL COM CAROTID INNOMINATE UNI L MOD SED  10/24/2016   IR ANGIO VERTEBRAL SEL SUBCLAVIAN INNOMINATE BILAT MOD SED  10/24/2016   IR PERCUTANEOUS ART THROMBECTOMY/INFUSION INTRACRANIAL INC DIAG ANGIO  10/24/2016   IR RADIOLOGIST EVAL & MGMT  12/05/2016   LEFT KNEE ARTHROSCOPY  2006   MAZE N/A 01/30/2018   Procedure: MAZE;  Surgeon: Purcell Nails, MD;  [facility];  Service: Open Heart Surgery;  Laterality: N/A;   MITRAL VALVE REPAIR N/A 01/30/2018   Procedure: MITRAL VALVE REPAIR (MVR) USING CARBOMEDICS ANNULOFLEX SIZE 28;  Surgeon: Purcell Nails, MD;  Location: [facility] OR;  Service: Open Heart Surgery;  Laterality: N/A;   POLYPECTOMY  10/21/2018   Procedure: POLYPECTOMY;  Surgeon: Kathi Der, MD;  Location: [facility];  Service: Gastroenterology;;   RADIOLOGY WITH ANESTHESIA N/A 10/24/2016   Procedure: RADIOLOGY WITH ANESTHESIA;  Surgeon: Julieanne Cotton, MD;  Location: [facility] OR;  Service: Radiology;  Laterality: N/A;   RIGHT FOOT SURGERY     RIGHT MINIATURE ANTERIOR THORACOTOMY FOR AORTIC VALVE REPLACEMENT AND CLOSURE PATENT FORAMEN OVALE  08-03-2010  DR Neldon Labella Magna-ease pericardial tissue valve (25mm)   RIGHT/LEFT HEART CATH AND CORONARY ANGIOGRAPHY N/A 10/14/2017   Procedure: RIGHT/LEFT HEART CATH AND CORONARY ANGIOGRAPHY;  Surgeon: Tonny Bollman, MD;  [facility] INVASIVE  CV LAB;  Service: Cardiovascular;  Laterality: N/A;   TEE WITHOUT CARDIOVERSION N/A 10/14/2017   Procedure: TRANSESOPHAGEAL ECHOCARDIOGRAM (TEE);  Surgeon: Wendall Stade, MD;  Location: [facility] ENDOSCOPY;  Service: Cardiovascular;  Laterality: N/A;   TOTAL HIP ARTHROPLASTY Left 08/02/2020   Procedure: TOTAL HIP ARTHROPLASTY ANTERIOR APPROACH;  Surgeon: Durene Romans, MD;  Location: [facility]S;  Service: Orthopedics;  Laterality: Left;   TOTAL KNEE ARTHROPLASTY Left 09/19/2021   Procedure: TOTAL KNEE ARTHROPLASTY;  Surgeon: Durene Romans, MD;  Location: [facility];  Service: Orthopedics;  Laterality: Left;   TOTAL KNEE ARTHROPLASTY Right 08/09/2022   Procedure: TOTAL KNEE ARTHROPLASTY;  Surgeon: Durene Romans, MD;  Location: [facility];  Service: Orthopedics;  Laterality: Right;  90   TRANSTHORACIC ECHOCARDIOGRAM  05/30/2016  dr skains   moderate  LVH ef 60-65%/  bioprothesis aortic valve present ,normal grandient and no AI /  mild MV calcification , moderate MR /  mild PR/ moderate TR/  PASP 22mmHg/ (RA denisty was identified 04-27-2016 echo) and is seen again today, this is likely a promient eustacian ridge, atrium is normal size   TRICUSPID VALVE REPLACEMENT N/A 01/30/2018   Procedure: TRICUSPID VALVE REPAIR USING MC3 SIZE 28;  Surgeon: Purcell Nails, MD;  Location: [facility] OR;  Service: Open Heart Surgery;  Laterality: N/A;     Social History:   reports that he has never smoked. He has never used smokeless tobacco. He reports current alcohol use. He reports that he does not use drugs.   Family History:  His family history includes Brain cancer in his father; Heart disease in his mother.   Allergies Allergies  Allergen Reactions   Sulfa Antibiotics Other (See Comments)    Granulocytosis   Sulfamethoxazole-Trimethoprim     Other Reaction(s): Unknown   Zestril [Lisinopril] Cough     Home Medications  Prior to Admission medications   Medication Sig Start Date End Date Taking? Authorizing Provider   allopurinol (ZYLOPRIM) 300 MG tablet Take 150 mg by mouth every morning.   Yes [provider]  amLODipine (NORVASC) 10 MG tablet TAKE 1 TABLET BY MOUTH DAILY 07/17/22  Yes Jake Bathe, MD  atenolol (TENORMIN) 25 MG tablet TAKE ONE-HALF TABLET BY MOUTH  DAILY 09/20/22  Yes Jake Bathe, MD  cholecalciferol (VITAMIN D3) 25 MCG (1000 UNIT) tablet Take 1,000 Units by mouth daily.   Yes [provider]  Cinnamon 500 MG capsule Take 1,500 mg by mouth every morning.   Yes [provider]  ferrous sulfate 325 (65 FE) MG EC tablet Take 325 mg by mouth 2 (two) times daily. 10/29/19  Yes [provider]  finasteride (PROSCAR) 5 MG tablet Take 1 tablet (5 mg total) by mouth daily. 11/05/16  Yes Angiulli, Mcarthur Rossetti, PA-C  Multiple Vitamins-Minerals (CENTRUM SILVER ADULT 50+) TABS Take 1 tablet by mouth daily.   Yes [provider]  rosuvastatin (CRESTOR) 10 MG tablet TAKE 1 TABLET BY MOUTH DAILY 07/17/22  Yes Jake Bathe, MD  silodosin (RAPAFLO) 8 MG CAPS capsule Take 8 mg by mouth daily. 11/02/21  Yes [provider]  traZODone (DESYREL) 50 MG tablet Take 25 mg by mouth at bedtime as needed for sleep.   Yes [provider]  warfarin (COUMADIN) 5 MG tablet TAKE 1 TO 1 AND 1/2 TABLETS BY  MOUTH DAILY OR AS DIRECTED BY  ANTICOAGULATION CLINIC Patient taking differently: Take 5-7.5 mg by mouth See admin instructions. Take 5 mg by mouth once daily on Sunday, Tuesday, Wednesday, Thursday, Friday, and Saturday. Take 7.5 mg by mouth once daily on Monday. 01/16/23  Yes Jake Bathe, MD     Rutherford Guys, PA - [facility] For pager details, please see AMION or use Epic chat  After 1900, please call [facility] for cross coverage needs 02/04/2023, 3:34 PM

## 2023-02-05 ENCOUNTER — Other Ambulatory Visit: Payer: Self-pay

## 2023-02-05 ENCOUNTER — Inpatient Hospital Stay (HOSPITAL_COMMUNITY): Payer: Medicare Other

## 2023-02-05 DIAGNOSIS — J9601 Acute respiratory failure with hypoxia: Secondary | ICD-10-CM | POA: Diagnosis not present

## 2023-02-05 DIAGNOSIS — N179 Acute kidney failure, unspecified: Secondary | ICD-10-CM | POA: Diagnosis not present

## 2023-02-05 DIAGNOSIS — R6521 Severe sepsis with septic shock: Secondary | ICD-10-CM

## 2023-02-05 DIAGNOSIS — A419 Sepsis, unspecified organism: Secondary | ICD-10-CM | POA: Diagnosis not present

## 2023-02-05 DIAGNOSIS — K567 Ileus, unspecified: Secondary | ICD-10-CM

## 2023-02-05 LAB — GLUCOSE, CAPILLARY
Glucose-Capillary: 165 mg/dL — ABNORMAL HIGH (ref 70–99)
Glucose-Capillary: 147 mg/dL — ABNORMAL HIGH (ref 70–99)
Glucose-Capillary: 143 mg/dL — ABNORMAL HIGH (ref 70–99)
Glucose-Capillary: 121 mg/dL — ABNORMAL HIGH (ref 70–99)
Glucose-Capillary: 177 mg/dL — ABNORMAL HIGH (ref 70–99)

## 2023-02-05 LAB — BASIC METABOLIC PANEL
Anion gap: 14 (ref 5–15)
BUN: 87 mg/dL — ABNORMAL HIGH (ref 8–23)
CO2: 19 mmol/L — ABNORMAL LOW (ref 22–32)
Calcium: 8 mg/dL — ABNORMAL LOW (ref 8.9–10.3)
Chloride: 112 mmol/L — ABNORMAL HIGH (ref 98–111)
Creatinine, Ser: 2.77 mg/dL — ABNORMAL HIGH (ref 0.61–1.24)
GFR, Estimated: 22 mL/min — ABNORMAL LOW (ref >60)
Glucose, Bld: 192 mg/dL — ABNORMAL HIGH (ref 70–99)
Potassium: 4.1 mmol/L (ref 3.5–5.1)
Sodium: 145 mmol/L (ref 135–145)

## 2023-02-05 LAB — URINE CULTURE: Culture: >=100000 — AB

## 2023-02-05 LAB — PROTIME-INR
INR: 2.6 — ABNORMAL HIGH (ref 0.8–1.2)
Prothrombin Time: 27.9 s — ABNORMAL HIGH (ref 11.4–15.2)

## 2023-02-05 LAB — CBC
HCT: 27.1 % — ABNORMAL LOW (ref 39.0–52.0)
Hemoglobin: 8.6 g/dL — ABNORMAL LOW (ref 13.0–17.0)
MCH: 30.1 pg (ref 26.0–34.0)
MCHC: 31.7 g/dL (ref 30.0–36.0)
MCV: 94.8 fL (ref 80.0–100.0)
Platelets: 45 10*3/uL — ABNORMAL LOW (ref 150–400)
RBC: 2.86 MIL/uL — ABNORMAL LOW (ref 4.22–5.81)
RDW: 17.1 % — ABNORMAL HIGH (ref 11.5–15.5)
WBC: 34.7 10*3/uL — ABNORMAL HIGH (ref 4.0–10.5)
nRBC: 0 % (ref 0.0–0.2)

## 2023-02-05 LAB — COOXEMETRY PANEL
Carboxyhemoglobin: 2.3 % — ABNORMAL HIGH (ref 0.5–1.5)
Methemoglobin: <0.7 % (ref 0.0–1.5)
O2 Saturation: 84.6 %
Total hemoglobin: 7.9 g/dL — ABNORMAL LOW (ref 12.0–16.0)

## 2023-02-05 MED ORDER — NOREPINEPHRINE 4 MG/250ML-% IV SOLN
0.0000 ug/min | INTRAVENOUS | Status: DC
  Administered 2023-02-05: 2 ug/min via INTRAVENOUS
  Administered 2023-02-06: 8 ug/min via INTRAVENOUS
  Administered 2023-02-07: 2 ug/min via INTRAVENOUS
  Administered 2023-02-07: 18 ug/min via INTRAVENOUS
  Filled 2023-02-05 (×4): qty 250

## 2023-02-05 MED ORDER — ACETAMINOPHEN 10 MG/ML IV SOLN: 1000.0000 mg | Freq: Once | INTRAVENOUS | Status: AC | PRN

## 2023-02-05 MED ORDER — METOCLOPRAMIDE HCL 5 MG/ML IJ SOLN
5.0000 mg | Freq: Three times a day (TID) | INTRAMUSCULAR | Status: AC
  Administered 2023-02-05 – 2023-02-07 (×9): 5 mg via INTRAVENOUS
  Filled 2023-02-05 (×9): qty 2

## 2023-02-05 MED ORDER — ENSURE ENLIVE PO LIQD
1.0000 | Freq: Three times a day (TID) | ORAL | Status: DC
  Administered 2023-02-05 – 2023-02-22 (×31): 237 mL via ORAL

## 2023-02-05 MED ORDER — ALBUTEROL SULFATE (2.5 MG/3ML) 0.083% IN NEBU
2.5000 mg | INHALATION_SOLUTION | RESPIRATORY_TRACT | Status: DC | PRN
  Administered 2023-02-07: 2.5 mg via RESPIRATORY_TRACT
  Filled 2023-02-05: qty 3

## 2023-02-05 MED ORDER — ALLOPURINOL 100 MG PO TABS
50.0000 mg | ORAL_TABLET | Freq: Every morning | ORAL | Status: DC
  Administered 2023-02-05 – 2023-02-22 (×14): 50 mg via ORAL
  Filled 2023-02-05 (×17): qty 1

## 2023-02-05 MED ORDER — SODIUM CHLORIDE 0.9% FLUSH
10.0000 mL | Freq: Two times a day (BID) | INTRAVENOUS | Status: DC
  Administered 2023-02-05 – 2023-02-06 (×2): 10 mL
  Administered 2023-02-06: 40 mL
  Administered 2023-02-07: 10 mL
  Administered 2023-02-07: 40 mL
  Administered 2023-02-08 (×2): 10 mL
  Administered 2023-02-09: 40 mL
  Administered 2023-02-09 – 2023-02-12 (×5): 10 mL
  Administered 2023-02-12: 30 mL
  Administered 2023-02-13 – 2023-02-18 (×9): 10 mL
  Administered 2023-02-19: 30 mL

## 2023-02-05 MED ORDER — ENSURE ENLIVE PO LIQD: 237.0000 mL | Freq: Three times a day (TID) | ORAL | Status: DC

## 2023-02-05 MED ORDER — SODIUM CHLORIDE 0.9 % IV SOLN
500.0000 mg | Freq: Two times a day (BID) | INTRAVENOUS | Status: DC
  Administered 2023-02-05 – 2023-02-06 (×2): 500 mg via INTRAVENOUS
  Filled 2023-02-05 (×2): qty 10

## 2023-02-05 MED ORDER — SODIUM CHLORIDE 0.9% FLUSH
10.0000 mL | INTRAVENOUS | Status: DC | PRN
Start: 2023-02-05 — End: 2023-02-22
  Administered 2023-02-13 (×2): 10 mL

## 2023-02-05 MED ORDER — GLYCERIN (LAXATIVE) 2 G RE SUPP
1.0000 | Freq: Once | RECTAL | Status: AC
  Administered 2023-02-05: 1 via RECTAL
  Filled 2023-02-05 (×2): qty 1

## 2023-02-05 MED ORDER — VASOPRESSIN 20 UNITS/100 ML INFUSION FOR SHOCK
0.0000 [IU]/min | INTRAVENOUS | Status: DC
Start: 2023-02-05 — End: 2023-02-06

## 2023-02-05 NOTE — Progress Notes (Addendum)
Rounding Note    Patient Name: [name]. Date of Encounter: 02/05/2023  [facility] Cardiologist: Donato Schultz, MD   Subjective   Patient pulled out NG tube yesterday and had a recurrent aspiration events. He spiked a fever of 102.6 and because tachypneic with increased work of breathing and had to be placed on HFNC. He was also started on Levophed for septic shock. Blood cultures also came back positive for enterobacter cloacae complex. Urine culture has currently grown >/= 100,000 of gram negative rods. Critical Care spoke with son [facility]) and patient was made DNR/ DNI.  He is doing better this morning. He is currently on 6L of HFNC (down from 55L last night). He does not feel very short of breath on this. No chest pain or abdominal pain.  Inpatient Medications    Scheduled Meds:  allopurinol  50 mg Oral q morning   arformoterol  15 mcg Nebulization BID   budesonide (PULMICORT) nebulizer solution  0.25 mg Nebulization BID   Chlorhexidine Gluconate Cloth  6 each Topical Daily   Glycerin (Adult)  1 suppository Rectal Once   ipratropium-albuterol  3 mL Nebulization Q6H   metoCLOPramide (REGLAN) injection  5 mg Intravenous Q8H   mouth rinse  15 mL Mouth Rinse 4 times per day   pantoprazole (PROTONIX) IV  40 mg Intravenous Q12H   sodium chloride flush  3-10 mL Intravenous Q12H   sodium chloride HYPERTONIC  4 mL Nebulization Q6H   Continuous Infusions:  sodium chloride     acetaminophen Stopped (02/05/23 0336)   meropenem (MERREM) IV     norepinephrine (LEVOPHED) Adult infusion 8 mcg/min (02/05/23 0900)   vasopressin     PRN Meds: acetaminophen **OR** acetaminophen, glycopyrrolate **OR** glycopyrrolate **OR** glycopyrrolate, haloperidol lactate, midazolam, ondansetron **OR** ondansetron (ZOFRAN) IV, mouth rinse, polyvinyl alcohol, sodium chloride flush, traZODone   Vital Signs    Vitals:   02/05/23 0615 02/05/23 0700 02/05/23 0800 02/05/23 0900  BP: (!)  94/43 (!) 110/59 (!) 113/55 120/66  Pulse: 63 61 (!) 53 66  Resp: 20 (!) 23 (!) 22 20  Temp:   98.8 F (37.1 C)   TempSrc:   Oral   SpO2: 95% 97% 97% 97%  Weight:      Height:        Intake/Output Summary (Last 24 hours) at 02/05/2023 1017 Last data filed at 02/05/2023 1002 Gross per 24 hour  Intake 1972.4 ml  Output 780 ml  Net 1192.4 ml      02/05/2023    5:00 AM 02/04/2023    4:38 AM 02/03/2023    5:00 AM  Last 3 Weights  Weight (lbs) 165 lb 9.1 oz 167 lb 12.3 oz 173 lb 4.5 oz  Weight (kg) 75.1 kg 76.1 kg 78.6 kg      Telemetry    Atrial fibrillation with rates in the 60s. Occasional PVCs. - Personally Reviewed  ECG    No new ECG tracing today. - Personally Reviewed  Physical Exam   GEN: Thin frail Caucasian male in no acute distress.   Neck: No JVD.  Cardiac: Irregularly irregular rhythm with normal rate. Soft systolic murmur.  Respiratory: On HFNC. Decreased breath sounds with faint crackles in bilateral bases (left > right). GI: Soft,non-distended, and non-tender. MS: No lower extremity edema. No deformity. Skin: Warm and dry. Neuro:  No focal deficits.  Psych: Normal affect. Responds appropriately.  Labs    High Sensitivity Troponin:   Recent Labs  Lab 02/02/23  0806 02/02/23 0950  TROPONINIHS 34* 59*     Chemistry Recent Labs  Lab 02/02/23 0806 02/03/23 0402 02/03/23 2348 02/04/23 0814 02/05/23 0255  NA 143 140 145 146* 145  K 3.4* 4.7 5.3* 4.6 4.1  CL 110 107 110 111 112*  CO2 19* 21* 22 22 19*  GLUCOSE 114* 184* 139* 145* 192*  BUN 22 40* 63* 72* 87*  CREATININE 1.19 1.47* 2.51* 2.39* 2.77*  CALCIUM 8.8* 8.7* 8.6* 8.3* 8.0*  MG  --  2.3  --   --   --   PROT 7.1 6.9 7.2  --   --   ALBUMIN 3.6 3.4* 3.4*  --   --   AST 29 27 56*  --   --   ALT 16 16 23   --   --   ALKPHOS 69 40 52  --   --   BILITOT 1.3* 1.4* 2.1*  --   --   GFRNONAA 60* 46* 24* 26* 22*  ANIONGAP 14 12 13 13 14     Lipids No results for input(s): "CHOL", "TRIG", "HDL",  "LABVLDL", "LDLCALC", "CHOLHDL" in the last 168 hours.  Hematology Recent Labs  Lab 02/03/23 2348 02/04/23 0814 02/05/23 0255  WBC 23.1* 23.7* 34.7*  RBC 3.17* 2.99* 2.86*  HGB 9.4* 8.8* 8.6*  HCT 30.8* 28.1* 27.1*  MCV 97.2 94.0 94.8  MCH 29.7 29.4 30.1  MCHC 30.5 31.3 31.7  RDW 16.8* 16.8* 17.1*  PLT 73* 61* 45*   Thyroid No results for input(s): "TSH", "FREET4" in the last 168 hours.  BNP Recent Labs  Lab 02/02/23 0806 02/04/23 1629  BNP 432.5* 1,032.1*    DDimer No results for input(s): "DDIMER" in the last 168 hours.   Radiology    [place] EKG SITE RITE Result Date: 02/05/2023 If [facility] image not attached, placement could not be confirmed due to current cardiac rhythm.  DG CHEST PORT 1 VIEW Result Date: 02/04/2023 CLINICAL DATA:  Shortness of breath. EXAM: PORTABLE CHEST 1 VIEW COMPARISON:  Chest radiograph dated 02/03/2023. FINDINGS: There is cardiomegaly with vascular congestion. No focal consolidation, pleural effusion, pneumothorax. Median sternotomy wires and mechanical cardiac valve. No acute osseous pathology. IMPRESSION: Cardiomegaly with vascular congestion. Electronically Signed   By: Elgie Collard M.D.   On: 02/04/2023 14:25   DG Abd 1 View Result Date: 02/04/2023 CLINICAL DATA:  Nausea and vomiting. EXAM: ABDOMEN - 1 VIEW COMPARISON:  02/03/2023 FINDINGS: Nasogastric tube tip is in the left upper abdomen and likely in the gastric body region. Bowel gas in the abdomen and pelvis with a nonobstructive pattern. Surgical clips in the right abdomen. Left hip arthroplasty. Surgical clips in the right groin. Two large calcifications in the region of the right renal shadow both measuring approximately 1 cm. 1 of these calcifications could be in the renal pelvic region. IMPRESSION: 1. Right renal calculi, largest measuring up to 1.0 cm. One stone could be in the renal pelvic region. This could be better evaluated with CT imaging. 2. Nonobstructive bowel gas pattern. 3.  Tip of the nasogastric tube is in the gastric body region. Electronically Signed   By: Richarda Overlie M.D.   On: 02/04/2023 10:04   ECHOCARDIOGRAM COMPLETE Result Date: 02/03/2023    ECHOCARDIOGRAM REPORT   Patient Name:   [name]. Date of Exam: 02/03/2023 Medical Rec #:  [id number]             Height:       72.0 in Accession #:  [id number]            Weight:       173.3 lb Date of Birth:  February 05, 1935             BSA:          2.005 m Patient Age:    87 years              BP:           91/64 mmHg Patient Gender: M                     HR:           76 bpm. Exam Location:  Inpatient Procedure: 2D Echo, Cardiac Doppler, Color Doppler and Intracardiac            Opacification Agent Indications:    TIA  History:        Patient has prior history of Echocardiogram examinations, most                 recent 03/19/2022. Previous Myocardial Infarction and CAD, COPD,                 Arrythmias:Atrial Fibrillation; Risk Factors:Hypertension.                 Aortic Valve: bioprosthetic valve is present in the aortic                 position.                 Mitral Valve: prosthetic annuloplasty ring valve is present in                 the mitral position.  Sonographer:    Webb Laws Referring Phys: [id number] DAVID MANUEL ORTIZ IMPRESSIONS  1. S/P MV repair with mean gradient 8 mmHg and no MR; s/p AVR with mean gradient 14 mmHg and no AI; s/p TV repair with mild TR.  2. Left ventricular ejection fraction, by estimation, is 60 to 65%. The left ventricle has normal function. The left ventricle has no regional wall motion abnormalities. There is moderate left ventricular hypertrophy. Left ventricular diastolic parameters are indeterminate. There is the interventricular septum is flattened in systole and diastole, consistent with right ventricular pressure and volume overload.  3. Right ventricular systolic function is normal. The right ventricular size is mildly enlarged. There is severely elevated pulmonary artery  systolic pressure.  4. Left atrial size was severely dilated.  5. Right atrial size was severely dilated.  6. The mitral valve has been repaired/replaced. No evidence of mitral valve regurgitation. No evidence of mitral stenosis. There is a prosthetic annuloplasty ring present in the mitral position.  7. The tricuspid valve is has been repaired/replaced. The tricuspid valve is status post repair with an annuloplasty ring.  8. The aortic valve has been repaired/replaced. Aortic valve regurgitation is not visualized. No aortic stenosis is present. There is a bioprosthetic valve present in the aortic position.  9. The inferior vena cava is dilated in size with <50% respiratory variability, suggesting right atrial pressure of 15 mmHg. FINDINGS  Left Ventricle: Left ventricular ejection fraction, by estimation, is 60 to 65%. The left ventricle has normal function. The left ventricle has no regional wall motion abnormalities. Definity contrast agent was given IV to delineate the left ventricular  endocardial borders. The left ventricular internal cavity size was normal in size. There is moderate left ventricular hypertrophy. The interventricular septum  is flattened in systole and diastole, consistent with right ventricular pressure and volume overload. Left ventricular diastolic parameters are indeterminate. Right Ventricle: The right ventricular size is mildly enlarged. Right ventricular systolic function is normal. There is severely elevated pulmonary artery systolic pressure. The tricuspid regurgitant velocity is 3.39 m/s, and with an assumed right atrial  pressure of 15 mmHg, the estimated right ventricular systolic pressure is 61.0 mmHg. Left Atrium: Left atrial size was severely dilated. Right Atrium: Right atrial size was severely dilated. Pericardium: There is no evidence of pericardial effusion. Mitral Valve: The mitral valve has been repaired/replaced. No evidence of mitral valve regurgitation. There is a  prosthetic annuloplasty ring present in the mitral position. No evidence of mitral valve stenosis. MV peak gradient, 22.5 mmHg. The mean mitral valve gradient is 8.0 mmHg. Tricuspid Valve: The tricuspid valve is has been repaired/replaced. Tricuspid valve regurgitation is mild . No evidence of tricuspid stenosis. The tricuspid valve is status post repair with an annuloplasty ring. Aortic Valve: The aortic valve has been repaired/replaced. Aortic valve regurgitation is not visualized. No aortic stenosis is present. Aortic valve mean gradient measures 14.2 mmHg. Aortic valve peak gradient measures 17.8 mmHg. Aortic valve area, by VTI measures 1.60 cm. There is a bioprosthetic valve present in the aortic position. Pulmonic Valve: The pulmonic valve was normal in structure. Pulmonic valve regurgitation is mild. No evidence of pulmonic stenosis. Aorta: The aortic root is normal in size and structure. Venous: The inferior vena cava is dilated in size with less than 50% respiratory variability, suggesting right atrial pressure of 15 mmHg. IAS/Shunts: No atrial level shunt detected by color flow Doppler. Additional Comments: S/P MV repair with mean gradient 8 mmHg and no MR; s/p AVR with mean gradient 14 mmHg and no AI; s/p TV repair with mild TR.  LEFT VENTRICLE PLAX 2D LVIDd:         4.50 cm      Diastology LVIDs:         3.50 cm      LV e' medial:    3.37 cm/s LV PW:         1.20 cm      LV E/e' medial:  59.6 LV IVS:        1.40 cm      LV e' lateral:   7.40 cm/s LVOT diam:     2.10 cm      LV E/e' lateral: 27.2 LV SV:         72 LV SV Index:   36 LVOT Area:     3.46 cm  LV Volumes (MOD) LV vol d, MOD A2C: 120.0 ml LV vol d, MOD A4C: 98.9 ml LV vol s, MOD A2C: 55.1 ml LV vol s, MOD A4C: 41.1 ml LV SV MOD A2C:     64.9 ml LV SV MOD A4C:     98.9 ml LV SV MOD BP:      60.2 ml RIGHT VENTRICLE             IVC RV Basal diam:  4.65 cm     IVC diam: 3.20 cm RV Mid diam:    4.20 cm RV S prime:     10.30 cm/s TAPSE (M-mode):  1.6 cm LEFT ATRIUM              Index        RIGHT ATRIUM           Index LA diam:  4.70 cm  2.34 cm/m   RA Area:     21.10 cm LA Vol (A2C):   141.0 ml 70.34 ml/m  RA Volume:   54.90 ml  27.39 ml/m LA Vol (A4C):   98.7 ml  49.24 ml/m LA Biplane Vol: 120.0 ml 59.86 ml/m  AORTIC VALVE                     PULMONIC VALVE AV Area (Vmax):    1.99 cm      PR End Diast Vel: 1.85 msec AV Area (Vmean):   1.63 cm AV Area (VTI):     1.60 cm AV Vmax:           211.12 cm/s AV Vmean:          176.800 cm/s AV VTI:            0.452 m AV Peak Grad:      17.8 mmHg AV Mean Grad:      14.2 mmHg LVOT Vmax:         121.00 cm/s LVOT Vmean:        83.000 cm/s LVOT VTI:          0.209 m LVOT/AV VTI ratio: 0.46  AORTA Ao Root diam: 3.30 cm Ao Asc diam:  3.50 cm MITRAL VALVE                TRICUSPID VALVE MV Area (PHT): 1.86 cm     TR Peak grad:   46.0 mmHg MV Area VTI:   1.10 cm     TR Vmax:        339.00 cm/s MV Peak grad:  22.5 mmHg MV Mean grad:  8.0 mmHg     SHUNTS MV Vmax:       2.37 m/s     Systemic VTI:  0.21 m MV Vmean:      132.0 cm/s   Systemic Diam: 2.10 cm MV Decel Time: 408 msec MV E velocity: 201.00 cm/s MV A velocity: 55.60 cm/s MV E/A ratio:  3.62 Olga Millers MD Electronically signed by Olga Millers MD Signature Date/Time: 02/03/2023/3:57:16 PM    Final    DG Abd 1 View Result Date: 02/03/2023 CLINICAL DATA:  Nasogastric tube placement. EXAM: ABDOMEN - 1 VIEW COMPARISON:  Earlier today FINDINGS: The enteric tube has been advanced, the tip and side port are below the diaphragm in the stomach. No bowel dilatation in the upper abdomen. Right upper quadrant surgical clips. IMPRESSION: Enteric tube tip and side port below the diaphragm in the stomach. Electronically Signed   By: Narda Rutherford M.D.   On: 02/03/2023 15:50   DG Abd 1 View Result Date: 02/03/2023 CLINICAL DATA:  Nausea and vomiting EXAM: ABDOMEN - 1 VIEW; PORTABLE ABDOMEN - 1 VIEW COMPARISON:  None Available. FINDINGS: Sequential images of  the upper abdomen demonstrate placement of an esophagogastric tube, tip below the diaphragm, side port above the gastroesophageal junction. Nonobstructive pattern of included bowel gas. No free air on supine radiographs. Cardiomegaly status post median sternotomy with valvular prosthesis and left atrial appendage clip. IMPRESSION: 1. Sequential images of the upper abdomen demonstrate placement of an esophagogastric tube, tip below the diaphragm, side port above the gastroesophageal junction. Recommend advancement. 2. Nonobstructive pattern of included bowel gas. Electronically Signed   By: Jearld Lesch M.D.   On: 02/03/2023 13:22   DG Abd Portable 1V Result Date: 02/03/2023 CLINICAL DATA:  Nausea and vomiting EXAM: ABDOMEN - 1 VIEW; PORTABLE  ABDOMEN - 1 VIEW COMPARISON:  None Available. FINDINGS: Sequential images of the upper abdomen demonstrate placement of an esophagogastric tube, tip below the diaphragm, side port above the gastroesophageal junction. Nonobstructive pattern of included bowel gas. No free air on supine radiographs. Cardiomegaly status post median sternotomy with valvular prosthesis and left atrial appendage clip. IMPRESSION: 1. Sequential images of the upper abdomen demonstrate placement of an esophagogastric tube, tip below the diaphragm, side port above the gastroesophageal junction. Recommend advancement. 2. Nonobstructive pattern of included bowel gas. Electronically Signed   By: Jearld Lesch M.D.   On: 02/03/2023 13:22   DG CHEST PORT 1 VIEW Result Date: 02/03/2023 CLINICAL DATA:  Hypoxia, status post aortic valve replacement EXAM: PORTABLE CHEST 1 VIEW COMPARISON:  02/02/2023 FINDINGS: Cardiomegaly status post median sternotomy with aortic valve prosthesis and left atrial appendage clip. Similar retrocardiac atelectasis. Mild diffuse interstitial opacity. No acute osseous findings. IMPRESSION: 1. Cardiomegaly status post median sternotomy with aortic valve prosthesis and left atrial  appendage clip. 2. Similar retrocardiac atelectasis. Mild diffuse interstitial opacity, consistent with edema. Electronically Signed   By: Jearld Lesch M.D.   On: 02/03/2023 12:22    Cardiac Studies   Echocardiogram 02/03/2023: Impressions:  1. S/P MV repair with mean gradient 8 mmHg and no MR; s/p AVR with mean  gradient 14 mmHg and no AI; s/p TV repair with mild TR.   2. Left ventricular ejection fraction, by estimation, is 60 to 65%. The  left ventricle has normal function. The left ventricle has no regional  wall motion abnormalities. There is moderate left ventricular hypertrophy.  Left ventricular diastolic  parameters are indeterminate. There is the interventricular septum is  flattened in systole and diastole, consistent with right ventricular  pressure and volume overload.   3. Right ventricular systolic function is normal. The right ventricular  size is mildly enlarged. There is severely elevated pulmonary artery  systolic pressure.   4. Left atrial size was severely dilated.   5. Right atrial size was severely dilated.   6. The mitral valve has been repaired/replaced. No evidence of mitral  valve regurgitation. No evidence of mitral stenosis. There is a prosthetic  annuloplasty ring present in the mitral position.   7. The tricuspid valve is has been repaired/replaced. The tricuspid valve  is status post repair with an annuloplasty ring.   8. The aortic valve has been repaired/replaced. Aortic valve  regurgitation is not visualized. No aortic stenosis is present. There is a  bioprosthetic valve present in the aortic position.   9. The inferior vena cava is dilated in size with <50% respiratory  variability, suggesting right atrial pressure of 15 mmHg.   Patient Profile     88 y.o. male with a history of non-obstructive CAD on cardiac catheterization in 10/2017, permanent atrial fibrillation on Coumadin,  aortic insufficiency s/p bioprosthetic AVR and PFO closure in 2012,  severe mitral regurgitation and tricuspid regurgitation s/p mitral valve repair and tricuspid valve repair in 01/2018, dilated ascending thoracic aorta, severe pulmonary hypertension, borderline diabetes, CVA, chronic anemia, and chronic thrombocytopenia who was admitted on 02/02/2023 for acute hypoxic respiratory failure secondary to acute diastolic CHF after presenting with shortness of breath and edema. Hospitalization also complicated by suspect aspiration pneumonia and small bowel obstruction requiring placement of NG tube. Patient pulled NG tube out on 1/27 and had recurrent aspiration event leading to worsening respiratory failure requiring HFNC and hypotension. He was started on pressor for septic shock.  Assessment & Plan  New Onset Acute Diastolic CHF Patient presented with shortness of breath and edema. BNP initially 432 but up to 1,032 on 1/27. Repeat chest x-ray on 1/27 showed cardiomegaly with vascular congestion. Echo showed LVEF of 60-65% with moderate LVH, mildly enlarged RV with normal systolic function, and flattened interventricular septum in systole and diastole consistent with RV pressure and volume overload. He was started on IV Lasix but this was stopped on 1/26 in setting of soft BP and AKI. There is now concerns for septic shock and he has been started on Levophed and Vasopressin. Net positive 840 this admission. Weight down 10 lbs form admission. - He does not appear significantly volume overloaded. - Critical Care has ordered PICC line with plans to check CVP and CO-OX. This will guide need for additional diuresis. - No Spironolactone of SGLT2 inhibitor due to renal function. - Continue to monitor daily weight, strict I/Os, and renal function.  Septic Shock Secondary to recurrent aspiration events. WBC up  to 34.7 today.  - Currently on Levophed. Critical Care planning to start Vasopressin today as well. - Continue to hold home Amlodipine and Atenolol. - Continue  antibiotics and pressor per Critical Care team.   Elevated Troponin Non-Obstructive CAD Noted on cardiac catheterization in 2019. High-sensitivity troponin minimally elevated but flat at 34 >> 59. - No chest pain.  - No aspirin due to need for full anticoagulation. - Can resume home statin when able to take PO medications. - Troponin elevation consistent with demand ischemia in setting of underlying illness. No plans for ischemic evaluation.   Permanent Atrial Fibrillation Rates in the 50s to 60s. - Home Atenolol held due to bradycardia and shock. - On chronic anticoagulation with Coumadin. Dosing per Pharmacy. Plan is to start Heparin when INR drop below 2.   Valvular Disease Patient has a significant history of valvular disease with prior bioprosthetic AVR in 2012 and then mitral valve and tricuspid valve repairs in 2020. Echo this admission is stable. Mean gradient across AVR is with no AI. Mean gradient of mitral valve is 8 mmHg with no MR. There is mild TR.   Pulmonary Hypertension Patient has history of severe pulmonary hypertension. RVSP 61 mmHg on Echo this admission which is stable from last Echo in 03/2022. Felt to be secondary to chronic valvular heart disease, age, and possible COPD. No additional work-up has been recommended for this in the past.    AKI Baseline creatinine around 0.8 to 0.9. Creatinine was 1.19 on admission and has been trending up.  - Creatinine up to 2.77 today in setting up septic shock. - Avoid nephrotoxic agents.  - Continue to monitor closely.   Hypokalemia  Hyperkalemia He has had some mildly low and mildly high potassium levels this admission. - Potassium currently stable at 4.1  - Continue to monitor closely.   Otherwise, per primary team: - Aspiration pneumonia - Small bowel obstruction - Enterobacter cloacae bacteremia - Lactic acidosis - Chronic anemia - Chronic thrombocytopenia - BPH - Gout - History of CVA  For questions or  updates, please contact [name] Please consult [email or web address] for contact info under        Signed, Corrin Parker, PA-C  02/05/2023, 10:17 AM    I have seen and examined the patient along with Corrin Parker, PA-C .  I have reviewed the chart, notes and new data.  I agree with PA/NP's note.  Key new complaints: Took a turn for the worse yesterday evening and  overnight, but seems to be rallying.  He reports that he is feeling quite comfortable and is not short of breath (on O2 at 6 L) Key examination changes: Normal heart rate and blood pressure, Levophed drip down to 3 mics, eating down.  On physical exam there is no evidence of hypervolemia.  JVP is not elevated, he has no peripheral edema, lungs have rhonchi, but no moist crackles on my exam. Key new findings / data: Renal parameters slightly worse than yesterday.  Borderline hypernatremic.  BNP was above baseline yesterday.  WBC has increased further  PLAN: Other than the elevated BNP there is no clear evidence of hypervolemia.  Would be cautious with any additional diuretics since he has low blood pressure, hypernatremia and worsening kidney function.  Thurmon Fair, MD, [facility] [phone] 02/05/2023, 1:39 PM

## 2023-02-05 NOTE — Evaluation (Signed)
Clinical/Bedside Swallow Evaluation Patient Details  Name: [name]. MRN: [id number] Date of Birth: Nov 11, 1935  [name] Date: 02/05/2023 Time: SLP Start Time (ACUTE ONLY): 8295 SLP Stop Time (ACUTE ONLY): 0940 SLP Time Calculation (min) (ACUTE ONLY): 26 min  Past Medical History:  Past Medical History:  Diagnosis Date   Acquired dilation of ascending aorta and aortic root (HCC)    43mm aortic root and 41mm ascending aorta on echo 03/2020   Arthritis    Bladder stones    Borderline diabetes    BPH (benign prostatic hyperplasia)    CAD (coronary artery disease) 12/19/2020   Non-obstructive coronary artery disease at cath in 2019   Complication of anesthesia    problem with voiding after anesthesia,   Coronary artery disease    cardiologist-  dr Rockey Situ gerhart NP--- per cath 06-02-2010 non-obstructive cad pLAD 30-40%   CVA (cerebral vascular accident) (HCC) 10/24/2016   Diverticulosis of colon    Dysrhythmia    afib   Gout    Heart murmur    History of adenomatous polyp of colon    2002-- tubular adenoma   History of aortic insufficiency    severe -- s/p  AVR 08-03-2010   History of small bowel obstruction    02/ 2007 mechanical sbo s/p  surgical intervention;  partial sbo 09/ 2011 and 03-20-2011 resolved without surgical intervention   History of urinary retention    HTN (hypertension)    Other secondary pulmonary hypertension (HCC) 03/20/2022   TTE 03/19/22: EF 65-70, no RWMA, moderate LVH, normal RVSF, severe pulmonary hypertension (RVSP 60.4), severe BAE, normal structure and function of mitral valve repair (mean gradient 9), mild TR, trivial AI, normal structure and function of AVR (mean 15.4), moderate PI, aortic root and ascending aorta 40 mm, RAP 15   Peripheral neuropathy    Persistent atrial fibrillation (HCC)    Pre-diabetes    S/P aortic valve replacement with prosthetic valve 08/03/2010   tissue valve   S/P Maze operation for atrial fibrillation  01/30/2018   Complete bilateral atrial lesion set using bipolar radiofrequency and cryothermy with clipping of LA appendage   S/P MVR (mitral valve repair) 01/30/2018   Complex valvuloplasty including artificial Gore-tex neochord placement x4 and Carbo medics Annuloflex ring annuloplasty, size 28   S/P patent foramen ovale closure 08/03/2010   at same time AVR   S/P tricuspid valve repair 01/30/2018   Using an MC3 Annuloplasty ring, size 28   Stroke (HCC)    Thrombocytopenia (HCC)    Tricuspid regurgitation    Past Surgical History:  Past Surgical History:  Procedure Laterality Date   BIOPSY  09/07/2018   Procedure: BIOPSY;  Surgeon: Kathi Der, MD;  Location: [facility];  Service: Gastroenterology;;   CARDIAC CATHETERIZATION  06-02-2010  dr Anne Fu   non-obstructive cad- pLAD 30-40%/  normal LVSF/  severe AI   CARDIOVASCULAR STRESS TEST  04/12/2016   Low risk nuclear perfusion study w/ no significant reversible ischemia/  normal LV function and wall motion ,  stress ef 60%/  2mm inferior and lateral scooped ST-segment depression w/ exercise (may be repolarization abnormality), exercise capacity was moderately reduced   CATARACT EXTRACTION W/ INTRAOCULAR LENS  IMPLANT, BILATERAL  02/2010   CLIPPING OF ATRIAL APPENDAGE N/A 01/30/2018   Procedure: CLIPPING OF LEFT ATRIAL APPENDAGE USING ATRICLIP PRO2 ;  Surgeon: Purcell Nails, MD;  Location: [facility] OR;  Service: Open Heart Surgery;  Laterality: N/A;   COLONOSCOPY WITH PROPOFOL  N/A 10/21/2018   Procedure: COLONOSCOPY WITH PROPOFOL;  Surgeon: Kathi Der, MD;  Location: [facility];  Service: Gastroenterology;  Laterality: N/A;   CYSTOSCOPY WITH LITHOLAPAXY N/A 06/05/2016   Procedure: CYSTOSCOPY WITH LITHOLAPAXY and fulgarization of bladder neck;  Surgeon: Bjorn Pippin, MD;  Location: [facility];  Service: Urology;  Laterality: N/A;   ESOPHAGOGASTRODUODENOSCOPY (EGD) WITH PROPOFOL N/A 09/07/2018   Procedure:  ESOPHAGOGASTRODUODENOSCOPY (EGD) WITH PROPOFOL;  Surgeon: Kathi Der, MD;  Location: [facility];  Service: Gastroenterology;  Laterality: N/A;   EXPLORATORY LAPARTOMY /  CHOLECYSTECTOMY  02/28/2005   for Small  bowel obstruction (mechnical)   IR ANGIO EXTRACRAN SEL COM CAROTID INNOMINATE UNI L MOD SED  10/24/2016   IR ANGIO VERTEBRAL SEL SUBCLAVIAN INNOMINATE BILAT MOD SED  10/24/2016   IR PERCUTANEOUS ART THROMBECTOMY/INFUSION INTRACRANIAL INC DIAG ANGIO  10/24/2016   IR RADIOLOGIST EVAL & MGMT  12/05/2016   LEFT KNEE ARTHROSCOPY  2006   MAZE N/A 01/30/2018   Procedure: MAZE;  Surgeon: Purcell Nails, MD;  Location[facility] OR;  Service: Open Heart Surgery;  Laterality: N/A;   MITRAL VALVE REPAIR N/A 01/30/2018   Procedure: MITRAL VALVE REPAIR (MVR) USING CARBOMEDICS ANNULOFLEX SIZE 28;  Surgeon: Purcell Nails, MD;  Location: [facility] OR;  Service: Open Heart Surgery;  Laterality: N/A;   POLYPECTOMY  10/21/2018   Procedure: POLYPECTOMY;  Surgeon: Kathi Der, MD;  Location: [facility];  Service: Gastroenterology;;   RADIOLOGY WITH ANESTHESIA N/A 10/24/2016   Procedure: RADIOLOGY WITH ANESTHESIA;  Surgeon: Julieanne Cotton, MD;  Location: [facility] OR;  Service: Radiology;  Laterality: N/A;   RIGHT FOOT SURGERY     RIGHT MINIATURE ANTERIOR THORACOTOMY FOR AORTIC VALVE REPLACEMENT AND CLOSURE PATENT FORAMEN OVALE  08-03-2010  DR Neldon Labella Magna-ease pericardial tissue valve (25mm)   RIGHT/LEFT HEART CATH AND CORONARY ANGIOGRAPHY N/A 10/14/2017   Procedure: RIGHT/LEFT HEART CATH AND CORONARY ANGIOGRAPHY;  Surgeon: Tonny Bollman, MD;  Location: [facility] INVASIVE CV LAB;  Service: Cardiovascular;  Laterality: N/A;   TEE WITHOUT CARDIOVERSION N/A 10/14/2017   Procedure: TRANSESOPHAGEAL ECHOCARDIOGRAM (TEE);  Surgeon: Wendall Stade, MD;  Location: [facility] ENDOSCOPY;  Service: Cardiovascular;  Laterality: N/A;   TOTAL HIP ARTHROPLASTY Left 08/02/2020   Procedure: TOTAL HIP ARTHROPLASTY ANTERIOR  APPROACH;  Surgeon: Durene Romans, MD;  Location: [facility];  Service: Orthopedics;  Laterality: Left;   TOTAL KNEE ARTHROPLASTY Left 09/19/2021   Procedure: TOTAL KNEE ARTHROPLASTY;  Surgeon: Durene Romans, MD;  Location: [facility];  Service: Orthopedics;  Laterality: Left;   TOTAL KNEE ARTHROPLASTY Right 08/09/2022   Procedure: TOTAL KNEE ARTHROPLASTY;  Surgeon: Durene Romans, MD;  Location: [facility];  Service: Orthopedics;  Laterality: Right;  90   TRANSTHORACIC ECHOCARDIOGRAM  05/30/2016  dr skains   moderate  LVH ef 60-65%/  bioprothesis aortic valve present ,normal grandient and no AI /  mild MV calcification , moderate MR /  mild PR/ moderate TR/  PASP 90mmHg/ (RA denisty was identified 04-27-2016 echo) and is seen again today, this is likely a promient eustacian ridge, atrium is normal size   TRICUSPID VALVE REPLACEMENT N/A 01/30/2018   Procedure: TRICUSPID VALVE REPAIR USING MC3 SIZE 28;  Surgeon: Purcell Nails, MD;  Location: [facility] OR;  Service: Open Heart Surgery;  Laterality: N/A;   HPI:  88yo male admitted 02/02/23 with SOB 2/2 CHF exacerbation with possible aspiration event. PMH: acquired dilation of the ascending aorta and aortic root, CAD, AFib, Right basal ganglia CVA (2018), HTN, heart  murmur, AVR, OA, BPH, SBO, neuropathy, thrombocytopenia.    Assessment / Plan / Recommendation  Clinical Impression  Pt seen at bedside for swallow evaluation. Son present during assessment. Pt presents with adequate natural dentition, CN exam WFL. Volitional cough is strong. Pt reports intermittent difficulty swallowing only when he takes large and/or consecutive boluses. No reported reflux or globus sensation. Pt completed oral care with suction after setup.   Following oral care, pt accepted trials of ice chips, water and puree. Slight weak, delayed cough noted following trials of ice chips and puree. Pt unable to pass 3oz water challenge, due to needing to take a rest break. Discussed pt with PA - pt  aspiration risk is significantly higher given O2 requirement. Decision made to try and wean pt O2 requirement and reassess at bedside, providing sips and chips after oral care until that time.  SLP Visit Diagnosis: Dysphagia, unspecified (R13.10)    Aspiration Risk  Moderate aspiration risk;Risk for inadequate nutrition/hydration    Diet Recommendation NPO    Medication Administration: Via alternative means    Other  Recommendations Oral Care Recommendations: Oral care QID;Oral care prior to ice chip/H20 Caregiver Recommendations: Have oral suction available    Recommendations for follow up therapy are one component of a multi-disciplinary discharge planning process, led by the attending physician.  Recommendations may be updated based on patient status, additional functional criteria and insurance authorization.  Follow up Recommendations Other (comment) (pending MBS)      Assistance Recommended at Discharge  24 hour supervision  Functional Status Assessment  Decline in status - limited ability to make significant improvement  Frequency and Duration (pending follow up reassessment)          Prognosis Prognosis for improved oropharyngeal function: Fair      Swallow Study   General Date of Onset: 02/02/23 HPI: 88yo male admitted 02/02/23 with SOB 2/2 CHF exacerbation with possible aspiration event. PMH: acquired dilation of the ascending aorta and aortic root, CAD, AFib, Right basal ganglia CVA (2018), HTN, heart murmur, AVR, OA, BPH, SBO, neuropathy, thrombocytopenia. Type of Study: Bedside Swallow Evaluation Previous Swallow Assessment: BSE 05/2021 = - no s/s, reg/thin Diet Prior to this Study: NPO Temperature Spikes Noted: No Respiratory Status: Other (comment) (HFNC) History of Recent Intubation: No Behavior/Cognition: Alert;Cooperative;Pleasant mood Oral Cavity Assessment: Within Functional Limits Oral Care Completed by SLP: Yes (with suction) Oral Cavity - Dentition:  Adequate natural dentition Vision: Functional for self-feeding Self-Feeding Abilities: Able to feed self Patient Positioning: Upright in bed Baseline Vocal Quality: Normal Volitional Cough: Strong Volitional Swallow: Able to elicit    Oral/Motor/Sensory Function Overall Oral Motor/Sensory Function: Within functional limits   Ice Chips Ice chips: Within functional limits Presentation: Spoon   Thin Liquid Thin Liquid: Not tested Other Comments: Pt unable to complete 3oz water challenge, requiring rest break. No cough response elicited.    Nectar Thick Nectar Thick Liquid: Not tested   Honey Thick Honey Thick Liquid: Not tested   Puree Puree: Within functional limits Presentation: Spoon   Solid     Solid: Not tested     Demoni Gergen B. Murvin Natal, MSP, CCC-SLP Speech Language Pathologist  [facility] 02/05/2023,10:53 AM

## 2023-02-05 NOTE — Progress Notes (Signed)
Progress Note     Subjective: Re-engaged as patient is significantly improved today and not transitioning to comfort care.  He is alert and talking and hungry.  He denies any N/V.  + flatus, but no BM since admission.    Objective: Vital signs in last 24 hours: Temp:  [97.9 F (36.6 C)-102.6 F (39.2 C)] 99.5 F (37.5 C) (01/28 0400) Pulse Rate:  [53-89] 66 (01/28 0900) Resp:  [14-40] 20 (01/28 0900) BP: (75-146)/(37-95) 120/66 (01/28 0900) SpO2:  [89 %-100 %] 97 % (01/28 0900) FiO2 (%):  [40 %] 40 % (01/28 0820) Weight:  [75.1 kg] 75.1 kg (01/28 0500) Last BM Date : 02/01/23  Intake/Output from previous day: 01/27 0701 - 01/28 0700 In: 1897.3 [I.V.:1255.7; IV Piggyback:641.6] Out: 780 [Urine:750; Emesis/NG output:30] Intake/Output this shift: Total I/O In: 65.1 [I.V.:65.1] Out: -   PE: General: pleasant, WD, male who is laying in bed in NAD Abd: soft, ND. NT. Well healed midline surgical scar with soft incisional hernia. Psych: A&Ox3 with an appropriate affect.    Lab Results:  Recent Labs    02/04/23 0814 02/05/23 0255  WBC 23.7* 34.7*  HGB 8.8* 8.6*  HCT 28.1* 27.1*  PLT 61* 45*   BMET Recent Labs    02/04/23 0814 02/05/23 0255  NA 146* 145  K 4.6 4.1  CL 111 112*  CO2 22 19*  GLUCOSE 145* 192*  BUN 72* 87*  CREATININE 2.39* 2.77*  CALCIUM 8.3* 8.0*   PT/INR Recent Labs    02/04/23 0741 02/05/23 0255  LABPROT 28.1* 27.9*  INR 2.6* 2.6*   CMP     Component Value Date/Time   NA 145 02/05/2023 0255   NA 143 09/04/2022 1421   K 4.1 02/05/2023 0255   CL 112 (H) 02/05/2023 0255   CO2 19 (L) 02/05/2023 0255   GLUCOSE 192 (H) 02/05/2023 0255   BUN 87 (H) 02/05/2023 0255   BUN 13 09/04/2022 1421   CREATININE 2.77 (H) 02/05/2023 0255   CALCIUM 8.0 (L) 02/05/2023 0255   PROT 7.2 02/03/2023 2348   PROT 7.2 12/20/2020 1011   ALBUMIN 3.4 (L) 02/03/2023 2348   ALBUMIN 4.4 12/20/2020 1011   AST 56 (H) 02/03/2023 2348   ALT 23 02/03/2023 2348    ALKPHOS 52 02/03/2023 2348   BILITOT 2.1 (H) 02/03/2023 2348   BILITOT 0.5 12/20/2020 1011   GFRNONAA 22 (L) 02/05/2023 0255   GFRAA 83 11/25/2019 1040   Lipase     Component Value Date/Time   LIPASE 20 09/17/2009 2214       Studies/Results: [place] EKG SITE RITE Result Date: 02/05/2023 If Site Rite image not attached, placement could not be confirmed due to current cardiac rhythm.  DG CHEST PORT 1 VIEW Result Date: 02/04/2023 CLINICAL DATA:  Shortness of breath. EXAM: PORTABLE CHEST 1 VIEW COMPARISON:  Chest radiograph dated 02/03/2023. FINDINGS: There is cardiomegaly with vascular congestion. No focal consolidation, pleural effusion, pneumothorax. Median sternotomy wires and mechanical cardiac valve. No acute osseous pathology. IMPRESSION: Cardiomegaly with vascular congestion. Electronically Signed   By: Elgie Collard M.D.   On: 02/04/2023 14:25   DG Abd 1 View Result Date: 02/04/2023 CLINICAL DATA:  Nausea and vomiting. EXAM: ABDOMEN - 1 VIEW COMPARISON:  02/03/2023 FINDINGS: Nasogastric tube tip is in the left upper abdomen and likely in the gastric body region. Bowel gas in the abdomen and pelvis with a nonobstructive pattern. Surgical clips in the right abdomen. Left hip arthroplasty. Surgical clips in the  right groin. Two large calcifications in the region of the right renal shadow both measuring approximately 1 cm. 1 of these calcifications could be in the renal pelvic region. IMPRESSION: 1. Right renal calculi, largest measuring up to 1.0 cm. One stone could be in the renal pelvic region. This could be better evaluated with CT imaging. 2. Nonobstructive bowel gas pattern. 3. Tip of the nasogastric tube is in the gastric body region. Electronically Signed   By: Richarda Overlie M.D.   On: 02/04/2023 10:04   ECHOCARDIOGRAM COMPLETE Result Date: 02/03/2023    ECHOCARDIOGRAM REPORT   Patient Name:   Arnez Stoneking. Date of Exam: 02/03/2023 Medical Rec #:  [id number]             Height:        72.0 in Accession #:    [id number]            Weight:       173.3 lb Date of Birth:  May 02, 1935             BSA:          2.005 m Patient Age:    88 years              BP:           91/64 mmHg Patient Gender: M                     HR:           76 bpm. Exam Location:  Inpatient Procedure: 2D Echo, Cardiac Doppler, Color Doppler and Intracardiac            Opacification Agent Indications:    TIA  History:        Patient has prior history of Echocardiogram examinations, most                 recent 03/19/2022. Previous Myocardial Infarction and CAD, COPD,                 Arrythmias:Atrial Fibrillation; Risk Factors:Hypertension.                 Aortic Valve: bioprosthetic valve is present in the aortic                 position.                 Mitral Valve: prosthetic annuloplasty ring valve is present in                 the mitral position.  Sonographer:    Webb Laws Referring Phys: [id number] DAVID MANUEL ORTIZ IMPRESSIONS  1. S/P MV repair with mean gradient 8 mmHg and no MR; s/p AVR with mean gradient 14 mmHg and no AI; s/p TV repair with mild TR.  2. Left ventricular ejection fraction, by estimation, is 60 to 65%. The left ventricle has normal function. The left ventricle has no regional wall motion abnormalities. There is moderate left ventricular hypertrophy. Left ventricular diastolic parameters are indeterminate. There is the interventricular septum is flattened in systole and diastole, consistent with right ventricular pressure and volume overload.  3. Right ventricular systolic function is normal. The right ventricular size is mildly enlarged. There is severely elevated pulmonary artery systolic pressure.  4. Left atrial size was severely dilated.  5. Right atrial size was severely dilated.  6. The mitral valve has been repaired/replaced. No evidence of mitral valve regurgitation. No evidence of  mitral stenosis. There is a prosthetic annuloplasty ring present in the mitral position.  7. The  tricuspid valve is has been repaired/replaced. The tricuspid valve is status post repair with an annuloplasty ring.  8. The aortic valve has been repaired/replaced. Aortic valve regurgitation is not visualized. No aortic stenosis is present. There is a bioprosthetic valve present in the aortic position.  9. The inferior vena cava is dilated in size with <50% respiratory variability, suggesting right atrial pressure of 15 mmHg. FINDINGS  Left Ventricle: Left ventricular ejection fraction, by estimation, is 60 to 65%. The left ventricle has normal function. The left ventricle has no regional wall motion abnormalities. Definity contrast agent was given IV to delineate the left ventricular  endocardial borders. The left ventricular internal cavity size was normal in size. There is moderate left ventricular hypertrophy. The interventricular septum is flattened in systole and diastole, consistent with right ventricular pressure and volume overload. Left ventricular diastolic parameters are indeterminate. Right Ventricle: The right ventricular size is mildly enlarged. Right ventricular systolic function is normal. There is severely elevated pulmonary artery systolic pressure. The tricuspid regurgitant velocity is 3.39 m/s, and with an assumed right atrial  pressure of 15 mmHg, the estimated right ventricular systolic pressure is 61.0 mmHg. Left Atrium: Left atrial size was severely dilated. Right Atrium: Right atrial size was severely dilated. Pericardium: There is no evidence of pericardial effusion. Mitral Valve: The mitral valve has been repaired/replaced. No evidence of mitral valve regurgitation. There is a prosthetic annuloplasty ring present in the mitral position. No evidence of mitral valve stenosis. MV peak gradient, 22.5 mmHg. The mean mitral valve gradient is 8.0 mmHg. Tricuspid Valve: The tricuspid valve is has been repaired/replaced. Tricuspid valve regurgitation is mild . No evidence of tricuspid stenosis.  The tricuspid valve is status post repair with an annuloplasty ring. Aortic Valve: The aortic valve has been repaired/replaced. Aortic valve regurgitation is not visualized. No aortic stenosis is present. Aortic valve mean gradient measures 14.2 mmHg. Aortic valve peak gradient measures 17.8 mmHg. Aortic valve area, by VTI measures 1.60 cm. There is a bioprosthetic valve present in the aortic position. Pulmonic Valve: The pulmonic valve was normal in structure. Pulmonic valve regurgitation is mild. No evidence of pulmonic stenosis. Aorta: The aortic root is normal in size and structure. Venous: The inferior vena cava is dilated in size with less than 50% respiratory variability, suggesting right atrial pressure of 15 mmHg. IAS/Shunts: No atrial level shunt detected by color flow Doppler. Additional Comments: S/P MV repair with mean gradient 8 mmHg and no MR; s/p AVR with mean gradient 14 mmHg and no AI; s/p TV repair with mild TR.  LEFT VENTRICLE PLAX 2D LVIDd:         4.50 cm      Diastology LVIDs:         3.50 cm      LV e' medial:    3.37 cm/s LV PW:         1.20 cm      LV E/e' medial:  59.6 LV IVS:        1.40 cm      LV e' lateral:   7.40 cm/s LVOT diam:     2.10 cm      LV E/e' lateral: 27.2 LV SV:         72 LV SV Index:   36 LVOT Area:     3.46 cm  LV Volumes (MOD) LV vol d, MOD A2C: 120.0  ml LV vol d, MOD A4C: 98.9 ml LV vol s, MOD A2C: 55.1 ml LV vol s, MOD A4C: 41.1 ml LV SV MOD A2C:     64.9 ml LV SV MOD A4C:     98.9 ml LV SV MOD BP:      60.2 ml RIGHT VENTRICLE             IVC RV Basal diam:  4.65 cm     IVC diam: 3.20 cm RV Mid diam:    4.20 cm RV S prime:     10.30 cm/s TAPSE (M-mode): 1.6 cm LEFT ATRIUM              Index        RIGHT ATRIUM           Index LA diam:        4.70 cm  2.34 cm/m   RA Area:     21.10 cm LA Vol (A2C):   141.0 ml 70.34 ml/m  RA Volume:   54.90 ml  27.39 ml/m LA Vol (A4C):   98.7 ml  49.24 ml/m LA Biplane Vol: 120.0 ml 59.86 ml/m  AORTIC VALVE                      PULMONIC VALVE AV Area (Vmax):    1.99 cm      PR End Diast Vel: 1.85 msec AV Area (Vmean):   1.63 cm AV Area (VTI):     1.60 cm AV Vmax:           211.12 cm/s AV Vmean:          176.800 cm/s AV VTI:            0.452 m AV Peak Grad:      17.8 mmHg AV Mean Grad:      14.2 mmHg LVOT Vmax:         121.00 cm/s LVOT Vmean:        83.000 cm/s LVOT VTI:          0.209 m LVOT/AV VTI ratio: 0.46  AORTA Ao Root diam: 3.30 cm Ao Asc diam:  3.50 cm MITRAL VALVE                TRICUSPID VALVE MV Area (PHT): 1.86 cm     TR Peak grad:   46.0 mmHg MV Area VTI:   1.10 cm     TR Vmax:        339.00 cm/s MV Peak grad:  22.5 mmHg MV Mean grad:  8.0 mmHg     SHUNTS MV Vmax:       2.37 m/s     Systemic VTI:  0.21 m MV Vmean:      132.0 cm/s   Systemic Diam: 2.10 cm MV Decel Time: 408 msec MV E velocity: 201.00 cm/s MV A velocity: 55.60 cm/s MV E/A ratio:  3.62 Olga Millers MD Electronically signed by Olga Millers MD Signature Date/Time: 02/03/2023/3:57:16 PM    Final    DG Abd 1 View Result Date: 02/03/2023 CLINICAL DATA:  Nasogastric tube placement. EXAM: ABDOMEN - 1 VIEW COMPARISON:  Earlier today FINDINGS: The enteric tube has been advanced, the tip and side port are below the diaphragm in the stomach. No bowel dilatation in the upper abdomen. Right upper quadrant surgical clips. IMPRESSION: Enteric tube tip and side port below the diaphragm in the stomach. Electronically Signed   By: Ivette Loyal.D.  On: 02/03/2023 15:50   DG Abd 1 View Result Date: 02/03/2023 CLINICAL DATA:  Nausea and vomiting EXAM: ABDOMEN - 1 VIEW; PORTABLE ABDOMEN - 1 VIEW COMPARISON:  None Available. FINDINGS: Sequential images of the upper abdomen demonstrate placement of an esophagogastric tube, tip below the diaphragm, side port above the gastroesophageal junction. Nonobstructive pattern of included bowel gas. No free air on supine radiographs. Cardiomegaly status post median sternotomy with valvular prosthesis and left atrial appendage  clip. IMPRESSION: 1. Sequential images of the upper abdomen demonstrate placement of an esophagogastric tube, tip below the diaphragm, side port above the gastroesophageal junction. Recommend advancement. 2. Nonobstructive pattern of included bowel gas. Electronically Signed   By: Jearld Lesch M.D.   On: 02/03/2023 13:22   DG Abd Portable 1V Result Date: 02/03/2023 CLINICAL DATA:  Nausea and vomiting EXAM: ABDOMEN - 1 VIEW; PORTABLE ABDOMEN - 1 VIEW COMPARISON:  None Available. FINDINGS: Sequential images of the upper abdomen demonstrate placement of an esophagogastric tube, tip below the diaphragm, side port above the gastroesophageal junction. Nonobstructive pattern of included bowel gas. No free air on supine radiographs. Cardiomegaly status post median sternotomy with valvular prosthesis and left atrial appendage clip. IMPRESSION: 1. Sequential images of the upper abdomen demonstrate placement of an esophagogastric tube, tip below the diaphragm, side port above the gastroesophageal junction. Recommend advancement. 2. Nonobstructive pattern of included bowel gas. Electronically Signed   By: Jearld Lesch M.D.   On: 02/03/2023 13:22   DG CHEST PORT 1 VIEW Result Date: 02/03/2023 CLINICAL DATA:  Hypoxia, status post aortic valve replacement EXAM: PORTABLE CHEST 1 VIEW COMPARISON:  02/02/2023 FINDINGS: Cardiomegaly status post median sternotomy with aortic valve prosthesis and left atrial appendage clip. Similar retrocardiac atelectasis. Mild diffuse interstitial opacity. No acute osseous findings. IMPRESSION: 1. Cardiomegaly status post median sternotomy with aortic valve prosthesis and left atrial appendage clip. 2. Similar retrocardiac atelectasis. Mild diffuse interstitial opacity, consistent with edema. Electronically Signed   By: Jearld Lesch M.D.   On: 02/03/2023 12:22    Anti-infectives: Anti-infectives (From admission, onward)    Start     Dose/Rate Route Frequency Ordered Stop   02/05/23  1700  meropenem (MERREM) 500 mg in sodium chloride 0.9 % 100 mL IVPB        500 mg 200 mL/hr over 30 Minutes Intravenous Every 12 hours 02/05/23 0718     02/04/23 1700  meropenem (MERREM) 1 g in sodium chloride 0.9 % 100 mL IVPB  Status:  Discontinued        1 g 200 mL/hr over 30 Minutes Intravenous Every 12 hours 02/04/23 1457 02/05/23 0718   02/04/23 1200  Ampicillin-Sulbactam (UNASYN) 3 g in sodium chloride 0.9 % 100 mL IVPB  Status:  Discontinued        3 g 200 mL/hr over 30 Minutes Intravenous Every 12 hours 02/04/23 0203 02/04/23 0944   02/04/23 1100  ceFEPIme (MAXIPIME) 2 g in sodium chloride 0.9 % 100 mL IVPB  Status:  Discontinued        2 g 200 mL/hr over 30 Minutes Intravenous Every 24 hours 02/04/23 0945 02/04/23 1456   02/03/23 1200  Ampicillin-Sulbactam (UNASYN) 3 g in sodium chloride 0.9 % 100 mL IVPB  Status:  Discontinued        3 g 200 mL/hr over 30 Minutes Intravenous Every 6 hours 02/03/23 1115 02/04/23 0203        Assessment/Plan SBO vs ileus H/o ex lap cholecystectomy  - febrile to 102.6. WBC  elevated to 34.7. Bcx with gram variable rods, but patient looks significantly better than these numbers - UCX >100,000 gram - rods.  CT today to eval for nephrolithiasis per CCM - tolerating NGT out and + flatus.  Abdomen very soft. - SLP to see today, if passes, can try CLD. - son at bedside as well as CCM, discussed with patient and the aforementioned.  FEN: CLD if passes SLP ID: [email or web address] VTE: held in setting of thrombocytopenia  Per primary Aspiration PNA CHF exacerbation S/p MVR, AVR, TVR - on coumadin at baseline. Held Pulm HTN CAD BPH Gout Thrombocytopenia H/o strok - L hemiparesis  I reviewed hospitalist notes, last 24 h vitals and pain scores, last 48 h intake and output, last 24 h labs and trends, and last 24 h imaging results.     LOS: 2 days   Letha Cape, [facility] 02/05/2023, 9:23 AM Please see Amion for pager number  during day hours 7:00am-4:30pm

## 2023-02-05 NOTE — Progress Notes (Signed)
Patient not seen, CCM will take over care.

## 2023-02-05 NOTE — Progress Notes (Signed)
NAME:  Gerald Parodi., MRN:  [id number], DOB:  03-03-1935, LOS: 2 ADMISSION DATE:  02/02/2023, CONSULTATION DATE:  02/04/23 REFERRING MD:  Sunnie Nielsen CHIEF COMPLAINT:  Dyspnea   History of Present Illness:  Gerald Olmo. is a 88 y.o. male who has an extensive PMH as below. He presented to [facility] ED 1/25 with dyspnea that woke him from his sleep the night prior. He had also complained of LE edema over the preceding few weeks. On EMS arrival, he had sats of 85% on room air. He was placed on [place] O2 and given Solumedrol and DuoNebs in route to ED>  In ED, CXR was suggestive of mild edema. He was admitted by [facility] and started on diuresis. He was seen by cardiology in consultation who agreed with diuresis for fluid overload and obtaining an echo. Echo was obtained and demonstrated EF 60-65%, mod LVH, RV pressure overload with RVSP 61, LA and RA severely dilated.  On evening of 1/26, he developed nausea and vomiting with probable aspiration event. There was concern for SBO and was seen by general surgery who recommended continuing NPO and continuing NGT to suction.  On 1/27, diuresis was held due to bump in renal function after IV lasix. He was only net negative since admit. He did not appear to have excess volume per cardiology notes and appeared improved from CHF standpoint. Dyspnea and hypoxia were felt to more likely be related to aspiration PNA. General surgery had also seen and he had benign exam and was passing flatus. He unfortunately pulled out his NGT that day.  After pulling out NGT 1/27, he had episode of tachypnea and increased WOB along with spike in temp to 102.8F. Due to increased WOB, PCCM asked to see in consultation. He had been on 4L [place] prior to this episode. Blood cultures from admission also returned positive for Enterobacter cloacae. He was on Cefepime but after discussion with TRH, we will escalate to Meropenem.  On our arrival, he appears tachypneic but subjectively  feels that breathing is "OK". He appears globally weak. His wife and brother at bedside along with caregiver. They tell me that he had been aspirating on food for at least a week prior to his admit and complained of "food going down the wrong pipe" followed by coughing spells. Denies ever being told he had dysphagia and needed a specific diet.  I called pt's son [name] who is HCPOA and discussed code status and goals of care. See separate IPAL note but in short, Pt had made it clear in recent times that if he were hospitalized and had a condition that was not reversible or had a poor prognosis, he would not want to prolong things or be placed on life support devices. While I explained to [facility] that while antibiotics should help with his aspiration and bacteremia, I was concerned with his work of breathing and him fatiguing. We agreed that if he reached the point of needing mechanical ventilation, the procedure itself would pose risks including hypotension/shock and possible cardiac arrest given his cardiac history with severe PAH etc. We also agreed that CPR would certainly cause more harm and suffering and would not be in line with pt's wishes. In light of this, we agreed to place DNR/DNI orders on the chart and to continue with ongoing supportive measures in hopes that with time, abx and supportive care will be enough to get pt back to his prior state of health.  Pertinent  Medical  History:  has Essential hypertension; Gout; History of small bowel obstruction; History of BPH; S/P AVR; Hypokalemia; Left hemiparesis (HCC); Dysphagia, Ayers-stroke; S/P AVR (aortic valve replacement); Benign prostatic hyperplasia with lower urinary tract symptoms; History of ischemic right MCA stroke; Permanent atrial fibrillation (HCC); Hyperglycemia; Mitral valve regurgitation; Tricuspid regurgitation; s/p mitral valve repair; S/P tricuspid valve repair; S/P Maze operation for atrial fibrillation; Long term current use of  anticoagulant; Anemia; Anemia due to chronic blood loss; Acquired dilation of ascending aorta and aortic root (HCC); Chronic congestive heart failure (HCC); Elevated PSA; History of adenomatous polyp of colon; History of anemia; Hypercoagulable state (HCC); Impairment of balance; Peripheral neuropathy; Personal history of other malignant neoplasm of skin; Prediabetes; Presence of prosthetic heart valve; Sleep disorder; Thrombocytopenia (HCC); Vitamin D deficiency; Hypertrophy of prostate with urinary obstruction and other lower urinary tract symptoms (LUTS); Closed left hip fracture, initial encounter (HCC); Malnutrition of moderate degree; S/P left total hip arthroplasty; CAD (coronary artery disease); Cerebrovascular accident (CVA) (HCC); SIRS (systemic inflammatory response syndrome) (HCC); Generalized weakness; Otitis externa; Valvular heart disease; Hypoalbuminemia due to protein-calorie malnutrition (HCC); Abnormal findings on diagnostic imaging of other specified body structures; S/P total knee arthroplasty, left; Symptomatic anemia; HLD (hyperlipidemia); Hematuria; Weakness; Urinary catheter in place; COVID-19 virus infection; Other secondary pulmonary hypertension (HCC); Preoperative cardiovascular examination; Gait abnormality; Bilateral foot-drop; S/P total knee arthroplasty, right; Acute respiratory failure with hypoxia (HCC); Elevated troponin; Volume overload; Aspiration pneumonia (HCC); and DNR (do not resuscitate) on their problem list.  Significant Hospital Events: Including procedures, antibiotic start and stop dates in addition to other pertinent events   1/25 admit 1/27 pulled out NGT and had episode of respiratory distress along with spike in temp to 102.42F. PCCM consulted. 1/27 Blood cultures returned positive for Enterobacter cloacae. 1/27 worsening shock, started on NE with escalating doses, comfort meds placed 1/28 much improved, more awake and interactive, NE down to 9  Interim  History / Subjective:  On HHFNC 55L and 40%. Much improved this AM. Interactive, asking to eat. BNP 1032.  Objective:  Blood pressure (!) 94/43, pulse 63, temperature 99.5 F (37.5 C), temperature source Oral, resp. rate 20, height 6' (1.829 m), weight 75.1 kg, SpO2 95%.    FiO2 (%):  [40 %] 40 %   Intake/Output Summary (Last 24 hours) at 02/05/2023 0801 Last data filed at 02/05/2023 1610 Gross per 24 hour  Intake 1872.35 ml  Output 780 ml  Net 1092.35 ml   Filed Weights   02/03/23 0500 02/04/23 0438 02/05/23 0500  Weight: 78.6 kg 76.1 kg 75.1 kg    Examination: General: Elderly male, chronically ill appearing. Neuro: Awake, alert, answers questions appropriately, MAE's. HEENT: [place]/AT. Sclerae anicteric. EOMI. Cardiovascular: IRIR, 3/6 SEM.  Lungs: Respirations normal. CTAB. Abdomen: BS x 4, soft, NT/ND.  Musculoskeletal: Some muscle wasting. No gross deformities, no edema.  Skin: Intact, warm, no rashes.  Labs/imaging personally reviewed:  Echo 1/26 > EF 60-65%, mod LVH, RV pressure overload with RVSP 61, LA and RA severely dilated CT chest 1/27 >  CT renal 1/27 >   Assessment & Plan:   Acute hypoxic respiratory failure - multifactorial in setting aspiration PNA, fluid overload/CHF exacerbation, atelectasis. - Continue HHFNC with higher flow rates to help alleviate some of his work of breathing (doesn't necessarily need higher FiO2). - Avoiding BiPAP given recent vomiting and aspiration. - Continue Meropenem. - F/u on CT chest, stable enough to go today. - Continue hypertonic nebs and scheduled DuoNebs. - SLP eval today. -  PT eval today.  Enterobacter cloacae bacteremia - possibly 2/2 UTI. Imaging mentions possible renal calculi. - Change abx from Cefepime to Meropenem. - F/u on CT renal study, might urology for stent if this is etiology of everything.  Septic shock - 2/2 above + cardiac component given his degree of PAH with subsequent right heart failure. BNP  elevated at 1032. - Continue NE, goal MAP > 65. - Add Vaso. - Get PICC then assess CVP, Co-ox.  Ileus and less likely SBO - had NGT but pt removed this 1/27. He tells me he continues to pass flatus but has not had a BM yet. - Supportive care, is still passing flatus currently so hopeful this is resolving. - Add Reglan. - Advance diet as able.  AKI  - worsened after IV diuresis and shock state. Possible renal calculi - F/u on CT renal study. - Get PICC placed, add CVP and coox. Will probably need diuresis with vasopressor support. - Follow BMP.  Goals of care. - Continue supportive care including abx, fluids, antipyretics etc. - DNR/DNI in the event of arrest. - If declines further, family aware they may be approached to consider comfort measures per pt's prior wishes. For now, discussed PICC and pressors with diuresis given his improvement. If does not tolerate, then transition to comfort.   Best practice (evaluated daily):  Code Status:  DNR/DNI DVT prophylaxis: SCD's Diet: SLP eval Activity: PT eval pending Lines: PICC pending, maintain foley Disposition : ICU   CC time: 35 min.  Rutherford Guys, PA - [facility] For pager details, please see AMION or use Epic chat  After 1900, please call [facility] for cross coverage needs 02/05/2023, 8:01 AM

## 2023-02-05 NOTE — Progress Notes (Signed)
Noted update for transition to comfort care made last night.  We will be available if needed.  Letha Cape 7:09 AM 02/05/2023

## 2023-02-05 NOTE — Progress Notes (Signed)
PCCM Brief Note  Approached by family and pt again regarding eating. Pt has been requesting food since yesterday prior to decline and then again all day today after significant improvement overnight.  He has failed swallow eval with delayed cough. This was performed on HHFNC.  Discussed with speech regarding trying again after weaning off HHFNC. They have not yet been able to do so.  I discussed risks of aspiration with pt and family. Pt really wishes to eat or at least drink an Ensure. I informed them that as long as they acknowledge risk of aspiration with potential complications, I would not be against ordering Ensure. Pt verbalizes understanding. Agrees to take only small sips and with nursing supervision only.  Hopeful that he'll do well with Ensure and can then have regular diet tomorrow.    Rutherford Guys, PA - [facility] [place] [facility] For pager details, please see AMION or use Epic chat  After 1900, please call [facility] for cross coverage needs 02/05/2023, 5:19 PM

## 2023-02-05 NOTE — Progress Notes (Signed)
Pharmacy Antibiotic Note  [name]. is a 88 y.o. male admitted on 02/02/2023 with  Enterobacter cloacae bacteremia .  Pharmacy has been consulted for meropenem dosing.  Today, 02/05/23 WBC elevated and increased Worsening renal function Febrile, remains on pressors  Plan: Reduce meropenem to 500 mg IV q12h Continue to follow renal function for dose adjustments  Height: 6' (182.9 cm) Weight: 75.1 kg (165 lb 9.1 oz) IBW/kg (Calculated) : 77.6  Temp (24hrs), Avg:99.9 F (37.7 C), Min:97.9 F (36.6 C), Max:102.6 F (39.2 C)  Recent Labs  Lab 02/02/23 0806 02/03/23 0402 02/03/23 1955 02/03/23 2348 02/04/23 0814 02/05/23 0255  WBC 8.6 21.7*  --  23.1* 23.7* 34.7*  CREATININE 1.19 1.47*  --  2.51* 2.39* 2.77*  LATICACIDVEN  --   --  3.1* 3.2*  --   --     Estimated Creatinine Clearance: 20.7 mL/min (A) (by C-G formula based on SCr of 2.77 mg/dL (H)).    Allergies  Allergen Reactions   Sulfa Antibiotics Other (See Comments)    Granulocytosis   Sulfamethoxazole-Trimethoprim     Other Reaction(s): Unknown   Zestril [Lisinopril] Cough    Microbiology results: 1/26 BCx: Enterobacter cloacae complex 1/26 UCx: >100K Enterobacter species   Cindi Carbon, PharmD 02/05/2023 7:23 AM

## 2023-02-05 NOTE — Progress Notes (Signed)
Peripherally Inserted Central Catheter Placement  The IV Nurse has discussed with the patient and/or persons authorized to consent for the patient, the purpose of this procedure and the potential benefits and risks involved with this procedure.  The benefits include less needle sticks, lab draws from the catheter, and the patient may be discharged home with the catheter. Risks include, but not limited to, infection, bleeding, blood clot (thrombus formation), and puncture of an artery; nerve damage and irregular heartbeat and possibility to perform a PICC exchange if needed/ordered by physician.  Alternatives to this procedure were also discussed.  Bard Power PICC patient education guide, fact sheet on infection prevention and patient information card has been provided to patient /or left at bedside.    PICC Placement Documentation  PICC Triple Lumen 02/05/23 Right Brachial 40 cm 0 cm (Active)  Indication for Insertion or Continuance of Line Vasoactive infusions;Chronic illness with exacerbations (CF, Sickle Cell, etc.) 02/05/23 1409  Exposed Catheter (cm) 0 cm 02/05/23 1409  Site Assessment Clean, Dry, Intact 02/05/23 1409  Lumen #1 Status Flushed;Saline locked;Blood return noted 02/05/23 1409  Lumen #2 Status Flushed;Saline locked;Blood return noted 02/05/23 1409  Lumen #3 Status Flushed;Saline locked;Blood return noted 02/05/23 1409  Dressing Type Transparent;Securing device 02/05/23 1409  Dressing Status Antimicrobial disc/dressing in place 02/05/23 1409  Line Care Connections checked and tightened 02/05/23 1409  Line Adjustment (NICU/IV Team Only) No 02/05/23 1409  Dressing Intervention New dressing;Adhesive placed at insertion site (IV team only) 02/05/23 1409  Dressing Change Due 02/12/23 02/05/23 1409       Vernona Rieger  Myndi Wamble 02/05/2023, 2:10 PM

## 2023-02-05 NOTE — Progress Notes (Signed)
Changed H20 bag on heated high flow nasal cannula system- uneventful.

## 2023-02-06 ENCOUNTER — Inpatient Hospital Stay (HOSPITAL_COMMUNITY): Payer: Medicare Other

## 2023-02-06 DIAGNOSIS — N281 Cyst of kidney, acquired: Secondary | ICD-10-CM

## 2023-02-06 DIAGNOSIS — N200 Calculus of kidney: Secondary | ICD-10-CM | POA: Diagnosis not present

## 2023-02-06 DIAGNOSIS — A4181 Sepsis due to Enterococcus: Secondary | ICD-10-CM | POA: Diagnosis not present

## 2023-02-06 DIAGNOSIS — N201 Calculus of ureter: Secondary | ICD-10-CM | POA: Diagnosis not present

## 2023-02-06 DIAGNOSIS — A4159 Other Gram-negative sepsis: Secondary | ICD-10-CM | POA: Diagnosis not present

## 2023-02-06 DIAGNOSIS — J9601 Acute respiratory failure with hypoxia: Secondary | ICD-10-CM | POA: Diagnosis not present

## 2023-02-06 HISTORY — DX: Other gram-negative sepsis: A41.59

## 2023-02-06 LAB — CULTURE, BLOOD (ROUTINE X 2)

## 2023-02-06 LAB — CBC WITH DIFFERENTIAL/PLATELET
Abs Immature Granulocytes: 0.04 10*3/uL (ref 0.00–0.07)
Basophils Absolute: 0 10*3/uL (ref 0.0–0.1)
Basophils Relative: 0 %
Eosinophils Absolute: 0 10*3/uL (ref 0.0–0.5)
Eosinophils Relative: 0 %
HCT: 25.3 % — ABNORMAL LOW (ref 39.0–52.0)
Hemoglobin: 7.8 g/dL — ABNORMAL LOW (ref 13.0–17.0)
Immature Granulocytes: 0 %
Lymphocytes Relative: 11 %
Lymphs Abs: 1.2 10*3/uL (ref 0.7–4.0)
MCH: 29.2 pg (ref 26.0–34.0)
MCHC: 30.8 g/dL (ref 30.0–36.0)
MCV: 94.8 fL (ref 80.0–100.0)
Monocytes Absolute: 0.5 10*3/uL (ref 0.1–1.0)
Monocytes Relative: 5 %
Neutro Abs: 9 10*3/uL — ABNORMAL HIGH (ref 1.7–7.7)
Neutrophils Relative %: 84 %
Platelets: 32 10*3/uL — ABNORMAL LOW (ref 150–400)
RBC: 2.67 MIL/uL — ABNORMAL LOW (ref 4.22–5.81)
RDW: 17.2 % — ABNORMAL HIGH (ref 11.5–15.5)
WBC: 10.8 10*3/uL — ABNORMAL HIGH (ref 4.0–10.5)
nRBC: 0 % (ref 0.0–0.2)

## 2023-02-06 LAB — CBC
HCT: 25.7 % — ABNORMAL LOW (ref 39.0–52.0)
Hemoglobin: 8 g/dL — ABNORMAL LOW (ref 13.0–17.0)
MCH: 29.9 pg (ref 26.0–34.0)
MCHC: 31.1 g/dL (ref 30.0–36.0)
MCV: 95.9 fL (ref 80.0–100.0)
Platelets: 27 10*3/uL — CL (ref 150–400)
RBC: 2.68 MIL/uL — ABNORMAL LOW (ref 4.22–5.81)
RDW: 17.1 % — ABNORMAL HIGH (ref 11.5–15.5)
WBC: 16.9 10*3/uL — ABNORMAL HIGH (ref 4.0–10.5)
nRBC: 0 % (ref 0.0–0.2)

## 2023-02-06 LAB — GLUCOSE, CAPILLARY
Glucose-Capillary: 143 mg/dL — ABNORMAL HIGH (ref 70–99)
Glucose-Capillary: 145 mg/dL — ABNORMAL HIGH (ref 70–99)
Glucose-Capillary: 259 mg/dL — ABNORMAL HIGH (ref 70–99)
Glucose-Capillary: 232 mg/dL — ABNORMAL HIGH (ref 70–99)
Glucose-Capillary: 180 mg/dL — ABNORMAL HIGH (ref 70–99)
Glucose-Capillary: 192 mg/dL — ABNORMAL HIGH (ref 70–99)

## 2023-02-06 LAB — PROTIME-INR
INR: 2.9 — ABNORMAL HIGH (ref 0.8–1.2)
Prothrombin Time: 30.4 s — ABNORMAL HIGH (ref 11.4–15.2)

## 2023-02-06 LAB — BASIC METABOLIC PANEL
Anion gap: 13 (ref 5–15)
BUN: 95 mg/dL — ABNORMAL HIGH (ref 8–23)
CO2: 22 mmol/L (ref 22–32)
Calcium: 8.4 mg/dL — ABNORMAL LOW (ref 8.9–10.3)
Chloride: 114 mmol/L — ABNORMAL HIGH (ref 98–111)
Creatinine, Ser: 2.64 mg/dL — ABNORMAL HIGH (ref 0.61–1.24)
GFR, Estimated: 23 mL/min — ABNORMAL LOW (ref >60)
Glucose, Bld: 297 mg/dL — ABNORMAL HIGH (ref 70–99)
Potassium: 3.8 mmol/L (ref 3.5–5.1)
Sodium: 149 mmol/L — ABNORMAL HIGH (ref 135–145)

## 2023-02-06 LAB — ABO/RH: ABO/RH(D): O POS

## 2023-02-06 LAB — MAGNESIUM: Magnesium: 2.9 mg/dL — ABNORMAL HIGH (ref 1.7–2.4)

## 2023-02-06 LAB — PHOSPHORUS: Phosphorus: 2.8 mg/dL (ref 2.5–4.6)

## 2023-02-06 MED ORDER — GLYCERIN (LAXATIVE) 2.1 G RE SUPP
1.0000 | Freq: Once | RECTAL | Status: AC
  Administered 2023-02-06: 1 via RECTAL
  Filled 2023-02-06: qty 1

## 2023-02-06 MED ORDER — SODIUM POLYSTYRENE SULFONATE 15 GM/60ML CO SUSP: 30.0000 g | Freq: Two times a day (BID) | Status: DC

## 2023-02-06 MED ORDER — DOCUSATE SODIUM 50 MG/5ML PO LIQD
100.0000 mg | Freq: Every day | ORAL | Status: DC
  Administered 2023-02-06 – 2023-02-20 (×7): 100 mg via ORAL
  Filled 2023-02-06 (×13): qty 10

## 2023-02-06 MED ORDER — FLEET ENEMA RE ENEM
1.0000 | ENEMA | Freq: Every day | RECTAL | Status: DC | PRN
  Administered 2023-02-06: 1 via RECTAL
  Filled 2023-02-06: qty 1

## 2023-02-06 MED ORDER — SODIUM CHLORIDE 0.9% IV SOLUTION: Freq: Once | INTRAVENOUS | Status: AC

## 2023-02-06 MED ORDER — SODIUM CHLORIDE 0.9 % IV SOLN
2.0000 g | INTRAVENOUS | Status: DC
  Administered 2023-02-06 – 2023-02-07 (×2): 2 g via INTRAVENOUS
  Filled 2023-02-06 (×2): qty 12.5

## 2023-02-06 NOTE — Progress Notes (Signed)
Pharmacy Antibiotic Note  [name]. is a 88 y.o. male admitted on 02/02/2023 with  Enterobacter cloacae bacteremia .  Antibiotics being narrowed from meropenem to cefepime based on sensitivities. Pharmacy consulted to dose cefepime.   Today, 02/06/23 WBC elevated and increased, last lab 1/28 AKI, monitor SCr daily Afebrile this morning, remains on pressors  Plan: Discontinue meropenem Start cefepime 2 g IV q24h Continue to follow renal function for dose adjustments  Height: 6' (182.9 cm) Weight: 72.8 kg (160 lb 7.9 oz) IBW/kg (Calculated) : 77.6  Temp (24hrs), Avg:98.5 F (36.9 C), Min:97.7 F (36.5 C), Max:100.8 F (38.2 C)  Recent Labs  Lab 02/02/23 0806 02/03/23 0402 02/03/23 1955 02/03/23 2348 02/04/23 0814 02/05/23 0255 02/06/23 0700  WBC 8.6 21.7*  --  23.1* 23.7* 34.7*  --   CREATININE 1.19 1.47*  --  2.51* 2.39* 2.77* 2.64*  LATICACIDVEN  --   --  3.1* 3.2*  --   --   --     Estimated Creatinine Clearance: 21.1 mL/min (A) (by C-G formula based on SCr of 2.64 mg/dL (H)).    Allergies  Allergen Reactions   Sulfa Antibiotics Other (See Comments)    Granulocytosis   Sulfamethoxazole-Trimethoprim     Other Reaction(s): Unknown   Zestril [Lisinopril] Cough    Microbiology results: 1/26 BCx: Enterobacter cloacae complex (pan-S) 1/26 UCx: >100K Enterobacter species (pan-S)  Cindi Carbon, PharmD 02/06/2023 9:13 AM

## 2023-02-06 NOTE — Progress Notes (Signed)
   02/06/23 0847  Oxygen Therapy/Pulse Ox  O2 Device HFNC  O2 Therapy Oxygen humidified (Refilled H20 bottle, safety valve checked.)  O2 Flow Rate (L/min) (S)  6 L/min (Weaned to 4 L HFNC.)

## 2023-02-06 NOTE — Progress Notes (Signed)
Progress Note     Subjective: Sitting up in bed drinking ensure. States he is passing some flatus but no BM even after suppository yesterday. Denies nausea or abdominal pain.  Objective: Vital signs in last 24 hours: Temp:  [97.7 F (36.5 C)-100.8 F (38.2 C)] 97.7 F (36.5 C) (01/29 0719) Pulse Rate:  [56-89] 65 (01/29 0700) Resp:  [11-37] 18 (01/29 0700) BP: (75-195)/(23-167) 108/56 (01/29 0700) SpO2:  [86 %-100 %] 99 % (01/29 0700) Weight:  [72.8 kg] 72.8 kg (01/29 0500) Last BM Date : 02/01/23  Intake/Output from previous day: 01/28 0701 - 01/29 0700 In: 513.1 [I.V.:413.1; IV Piggyback:100] Out: 1325 [Urine:1325] Intake/Output this shift: Total I/O In: 160.7 [I.V.:60.7; IV Piggyback:100] Out: -   PE: General: pleasant, WD, male who is laying in bed in NAD Abd: soft, mild distension. NT. Well healed midline surgical scar with soft incisional hernia. Psych: A&Ox3 with an appropriate affect.    Lab Results:  Recent Labs    02/04/23 0814 02/05/23 0255  WBC 23.7* 34.7*  HGB 8.8* 8.6*  HCT 28.1* 27.1*  PLT 61* 45*   BMET Recent Labs    02/04/23 0814 02/05/23 0255  NA 146* 145  K 4.6 4.1  CL 111 112*  CO2 22 19*  GLUCOSE 145* 192*  BUN 72* 87*  CREATININE 2.39* 2.77*  CALCIUM 8.3* 8.0*   PT/INR Recent Labs    02/04/23 0741 02/05/23 0255  LABPROT 28.1* 27.9*  INR 2.6* 2.6*   CMP     Component Value Date/Time   NA 145 02/05/2023 0255   NA 143 09/04/2022 1421   K 4.1 02/05/2023 0255   CL 112 (H) 02/05/2023 0255   CO2 19 (L) 02/05/2023 0255   GLUCOSE 192 (H) 02/05/2023 0255   BUN 87 (H) 02/05/2023 0255   BUN 13 09/04/2022 1421   CREATININE 2.77 (H) 02/05/2023 0255   CALCIUM 8.0 (L) 02/05/2023 0255   PROT 7.2 02/03/2023 2348   PROT 7.2 12/20/2020 1011   ALBUMIN 3.4 (L) 02/03/2023 2348   ALBUMIN 4.4 12/20/2020 1011   AST 56 (H) 02/03/2023 2348   ALT 23 02/03/2023 2348   ALKPHOS 52 02/03/2023 2348   BILITOT 2.1 (H) 02/03/2023 2348    BILITOT 0.5 12/20/2020 1011   GFRNONAA 22 (L) 02/05/2023 0255   GFRAA 83 11/25/2019 1040   Lipase     Component Value Date/Time   LIPASE 20 09/17/2009 2214       Studies/Results: DG CHEST PORT 1 VIEW Result Date: 02/05/2023 CLINICAL DATA:  PICC placement. EXAM: PORTABLE CHEST 1 VIEW COMPARISON:  Chest radiograph dated 02/04/2023. FINDINGS: Right-sided PICC with tip over central SVC. Improved aeration of the lungs compared to prior radiograph. No focal consolidation or pneumothorax. Trace bilateral pleural effusions suspected. Stable cardiac silhouette. Median sternotomy wires and mechanical cardiac valve. No acute osseous pathology. IMPRESSION: 1. Right-sided PICC with tip over central SVC. 2. Improved aeration of the lungs. Electronically Signed   By: Elgie Collard M.D.   On: 02/05/2023 15:12   [place] EKG SITE RITE Result Date: 02/05/2023 If Site Rite image not attached, placement could not be confirmed due to current cardiac rhythm.  DG CHEST PORT 1 VIEW Result Date: 02/04/2023 CLINICAL DATA:  Shortness of breath. EXAM: PORTABLE CHEST 1 VIEW COMPARISON:  Chest radiograph dated 02/03/2023. FINDINGS: There is cardiomegaly with vascular congestion. No focal consolidation, pleural effusion, pneumothorax. Median sternotomy wires and mechanical cardiac valve. No acute osseous pathology. IMPRESSION: Cardiomegaly with vascular congestion. Electronically Signed  By: Elgie Collard M.D.   On: 02/04/2023 14:25    Anti-infectives: Anti-infectives (From admission, onward)    Start     Dose/Rate Route Frequency Ordered Stop   02/05/23 1700  meropenem (MERREM) 500 mg in sodium chloride 0.9 % 100 mL IVPB        500 mg 200 mL/hr over 30 Minutes Intravenous Every 12 hours 02/05/23 0718     02/04/23 1700  meropenem (MERREM) 1 g in sodium chloride 0.9 % 100 mL IVPB  Status:  Discontinued        1 g 200 mL/hr over 30 Minutes Intravenous Every 12 hours 02/04/23 1457 02/05/23 0718   02/04/23 1200   Ampicillin-Sulbactam (UNASYN) 3 g in sodium chloride 0.9 % 100 mL IVPB  Status:  Discontinued        3 g 200 mL/hr over 30 Minutes Intravenous Every 12 hours 02/04/23 0203 02/04/23 0944   02/04/23 1100  ceFEPIme (MAXIPIME) 2 g in sodium chloride 0.9 % 100 mL IVPB  Status:  Discontinued        2 g 200 mL/hr over 30 Minutes Intravenous Every 24 hours 02/04/23 0945 02/04/23 1456   02/03/23 1200  Ampicillin-Sulbactam (UNASYN) 3 g in sodium chloride 0.9 % 100 mL IVPB  Status:  Discontinued        3 g 200 mL/hr over 30 Minutes Intravenous Every 6 hours 02/03/23 1115 02/04/23 0203        Assessment/Plan SBO vs ileus H/o ex lap cholecystectomy  - fever curve improving - febrile to 100.8. WBC improved to 16.9. Bcx with gram variable rods, but patient looks significantly better than these numbers - UCX >100,000 gram - rods.  CT per CCM to eval for nephrolithiasis pending - tolerating NGT out and + flatus.  Abdomen very soft. - SLP following but can have FLD and advance as tolerated from surgical standpoint - repeat suppository today  FEN: FLD pending SLP ID: [email or web address] VTE: held in setting of thrombocytopenia  Per primary Aspiration PNA CHF exacerbation S/p MVR, AVR, TVR - on coumadin at baseline. Held Pulm HTN CAD BPH Gout Thrombocytopenia H/o strok - L hemiparesis  I reviewed hospitalist notes, last 24 h vitals and pain scores, last 48 h intake and output, last 24 h labs and trends, and last 24 h imaging results.     LOS: 3 days   Eric Form, [facility] 02/06/2023, 8:23 AM Please see Amion for pager number during day hours 7:00am-4:30pm

## 2023-02-06 NOTE — Progress Notes (Addendum)
Patient Name: [name]. Date of Encounter: 02/06/2023 [facility] Cardiologist: Donato Schultz, MD   Interval Summary  .    Patient resting comfortably in bed this morning. Slightly increased respiratory rate but no signs of respiratory distress. He is being weaned down on O2. He has been cleared for liquids by speech therapy.   Vital Signs .    Vitals:   02/06/23 0847 02/06/23 0849 02/06/23 0851 02/06/23 0858  BP:      Pulse:      Resp:      Temp:      TempSrc:      SpO2: 100% 100% 100% 100%  Weight:      Height:        Intake/Output Summary (Last 24 hours) at 02/06/2023 0950 Last data filed at 02/06/2023 0721 Gross per 24 hour  Intake 608.64 ml  Output 1325 ml  Net -716.36 ml      02/06/2023    5:00 AM 02/05/2023    5:00 AM 02/04/2023    4:38 AM  Last 3 Weights  Weight (lbs) 160 lb 7.9 oz 165 lb 9.1 oz 167 lb 12.3 oz  Weight (kg) 72.8 kg 75.1 kg 76.1 kg      Telemetry/ECG    Persistent atrial fibrillation, ventricular rates in the 50s on my exam - Personally Reviewed  Physical Exam .   GEN: No acute distress.   Neck: No JVD Cardiac: irregularly irregular. 3/6 systolic murmur RUSB  Respiratory: RLL rhonchi. Left lung clear GI: Soft, nontender, non-distended  MS: No edema  Assessment & Plan .     New Onset Acute Diastolic CHF Septic shock Patient admitted with dyspnea and edema. His initial BNP was elevated at 427, trended to 1032. CXR on day of increased BNP showed notable vascular congestion. TTE ordered, showed LVEF preserved 60-65%, moderate LVH, mildly enlarged RV with normal systolic function. Interventricular septum flattened in systole and diastole, consistent with RV pressure and volume overload. Patient initially diuresed based on concern of volume overload but then developed hypotension and AKI with septic shock. Subsequently started on Levophed and Vasopressin. BC with Enterobacter cloacae.  This morning, patient restarted on  Levophed at 34mcg/min (had been stopped overnight), now being titrated down again.  Co-Ox and CVP from 1/28 reassuring with 85 and 6 respectively. CVP this morning 2 per nurse. No evidence of hypervolemia on exam. Right lung rhonchi consistent with aspiration, does not appear to have pulmonary edema and does not need diuresis at this time.  GDMT limited by critical illness and renal insufficiency Vasopressor management per primary team.   Elevated troponin Hx non-obstructive CAD 2019 LHC with non-obstructive CAD. Troponin this admission 34->59. Troponin elevation not consistent with ACS, likely represents demand ischemia.  Does not need ASA with need for anticoagulation for afib stroke prophylaxis. Resume statin when PO status allows.   Longstanding persistent atrial fibrillation Patient with stable ventricular rates this admission, 60s-70s overnight  Continue to hold home BB with septic shock and need for vasopressors Patient on Coumadin PTA. INR 2.9 today. Dosing per pharmacy. Would need heparin bridge if INR <2.  Valvular disease Patient with significant valvular disease hx. S/P bioprosthetic AVR in 2012 as well as mitral valve and tricuspid valve repairs 2020. TTE this admission shows stable valvular function, mean gradient across bioprosthetic AVR is . Mean mitral valve gradient .  Continue outpatient monitoring.   Pulmonary hypertension Hx severe pulmonary hypertension, suspected mixed group II and III with  valvular disease and COPD. Echo this admission supports pulmonary hypertension with interventricular septum flattened in systole and diastole.  AKI Electrolyte derangements  Creatinine up to 2.64 today, baseline less than 1.  Management per primary team   For questions or updates, please contact [facility] Please consult [email or web address] for contact info under        Signed, Perlie Gold, PA-C    I have seen and examined the patient along with Perlie Gold, PA-C .  I have reviewed the chart, notes and new data.  I agree with PA/NP's note.  Key new complaints: appears frail, but no distress Key examination changes: no signs of hypervolemia. Back on pressors w BP 110/55, AFib w VR 50s Key new findings / data: hypernatremic and abnormal renal parameters  PLAN: I think he is hypovolemic now. Note swallowing problems and overall guarded prognosis, long history of gradual decline and discussion around goals of care.  Will sign off for now, but please call if additional Cardiology issues arise.  Gerald Fair, MD, [facility] [phone] 02/06/2023, 11:32 AM

## 2023-02-06 NOTE — Progress Notes (Signed)
Speech Language Pathology Treatment: Dysphagia  Patient Details Name: [name]. MRN: [id number] DOB: [birth year] [name] Date: 02/06/2023 Time: 4098-1191 SLP Time Calculation (min) (ACUTE ONLY): 39 min  Assessment / Plan / Recommendation Clinical Impression  Patient seen today to determine readiness for advanced po textures.  Today pt able to brush his teeth and manage swishing/expectorating with water.  He can self feed water, apple juice, applesauce and Ensure.     80% of swallows were followed by cough and/or throat clearing most notably with thin water - suspect due to decreased airway closure.   Much improved tolerance of small single boluses of apple juice and Ensure.    Also noted eructation x4 during thin po trials - pt denies refluxing nor sensing backflow of boluses into pharynx.  He does endorse sensing retention in pharynx - but cannot specify if it's liquids, food or secretions.  Pt notably gagging with applesauce attempts, concerning for potential esophageal or GI source of these symptoms.   Pt has not had a BM since admission - but has enema ordered today.  Requested RN inform SLP If pt has BM today and then will proceed with MBS.  Spoke to Dr [name] and he was in agreement with plan.    Reviewed prior MBS flouroscopy loops from 2018 that showed potential proximal esophageal dysfunction and SLP at the time had suspected some component of chronic dysphagia.      HPI HPI: 88yo male admitted 02/02/23 with SOB 2/2 CHF exacerbation with possible aspiration event. PMH: acquired dilation of the ascending aorta and aortic root, CAD, AFib, Right basal ganglia CVA (2018), HTN, heart murmur, AVR, OA, BPH, SBO, neuropathy, thrombocytopenia.  PMH+ for dysphagia from CVA 2018 requiring thickener and another CVA that did not impair his swallowing.  Today follow up to determine readiness for pt to have advanced po diet vs MBS.      SLP Plan  Continue with current plan of care;MBS  (mbs after pt successfully had a BM, pt reports no BM since admitted)      Recommendations for follow up therapy are one component of a multi-disciplinary discharge planning process, led by the attending physician.  Recommendations may be updated based on patient status, additional functional criteria and insurance authorization.    Recommendations  Diet recommendations: Thin liquid;Nectar-thick liquid (full liquids in small amounts only) Liquids provided via: Cup;Straw Medication Administration: Other (Comment) (try with Ensure) Compensations: Slow rate;Small sips/bites (small single bites/sips) Postural Changes and/or Swallow Maneuvers: Seated upright 90 degrees;Upright 30-60 min after meal                  Oral care BID   Frequent or constant Supervision/Assistance Dysphagia, unspecified (R13.10);Dysphagia, oropharyngeal phase (R13.12)     Continue with current plan of care;MBS (mbs after pt successfully had a BM, pt reports no BM since admitted)    Gerald Infante, MS [facility] SLP Acute Rehab Services Office [phone]  [name]  02/06/2023, 9:53 AM

## 2023-02-06 NOTE — Plan of Care (Signed)
Problem: Education: Goal: Ability to demonstrate management of disease process will improve Outcome: Progressing Goal: Ability to verbalize understanding of medication therapies will improve Outcome: Progressing   Problem: Cardiac: Goal: Ability to achieve and maintain adequate cardiopulmonary perfusion will improve Outcome: Progressing   Problem: Education: Goal: Knowledge of General Education information will improve Description: Including pain rating scale, medication(s)/side effects and non-pharmacologic comfort measures Outcome: Progressing   Problem: Health Behavior/Discharge Planning: Goal: Ability to manage health-related needs will improve Outcome: Progressing

## 2023-02-06 NOTE — Progress Notes (Signed)
   02/06/23 1142  Oxygen Therapy/Pulse Ox  O2 Device (S)  Nasal Cannula (Changed to a regular 2 L [place]  O2 Therapy Oxygen humidified  O2 Flow Rate (L/min) 2 L/min  FiO2 (%) 28 %  Safety Instructions Yes (Comment)

## 2023-02-06 NOTE — Consult Note (Signed)
Urology Consult   Physician requesting consult: Rutherford Guys, PA-C  Reason for consult: Ureteral calculus, UTI  History of Present Illness: [name]. is a 88 y.o. male seen in consultation from Rutherford Guys, PA-C for evaluation of a right ureteral calculus with obstruction.  The patient was admitted to the hospital on 02/01/2022 with shortness of breath.  During his hospital course he developed a fever to 102.6.  Blood and urine cultures positive for Enterobacter.  He was evaluated with a CT scan today which showed a 12 mm calculus in the right proximal ureter with obstruction, nonobstructing right renal calculi, renal cyst including a possible hemorrhagic cyst in the lower pole of the right kidney.  He was initially on pressors for hypotension.  These were discontinued this morning.  He is currently afebrile and hemodynamically stable.  He is not having any right sided flank pain.  No dysuria or gross hematuria.    He has a prior history of kidney stones and has required lithotripsy in the past.   Past Medical History:  Diagnosis Date   Acquired dilation of ascending aorta and aortic root (HCC)    43mm aortic root and 41mm ascending aorta on echo 03/2020   Arthritis    Bladder stones    Borderline diabetes    BPH (benign prostatic hyperplasia)    CAD (coronary artery disease) 12/19/2020   Non-obstructive coronary artery disease at cath in 2019   Complication of anesthesia    problem with voiding after anesthesia,   Coronary artery disease    cardiologist-  dr Rockey Situ gerhart NP--- per cath 06-02-2010 non-obstructive cad pLAD 30-40%   CVA (cerebral vascular accident) (HCC) 10/24/2016   Diverticulosis of colon    Dysrhythmia    afib   Gout    Heart murmur    History of adenomatous polyp of colon    2002-- tubular adenoma   History of aortic insufficiency    severe -- s/p  AVR 08-03-2010   History of small bowel obstruction    02/ 2007 mechanical sbo s/p  surgical  intervention;  partial sbo 09/ 2011 and 03-20-2011 resolved without surgical intervention   History of urinary retention    HTN (hypertension)    Other secondary pulmonary hypertension (HCC) 03/20/2022   TTE 03/19/22: EF 65-70, no RWMA, moderate LVH, normal RVSF, severe pulmonary hypertension (RVSP 60.4), severe BAE, normal structure and function of mitral valve repair (mean gradient 9), mild TR, trivial AI, normal structure and function of AVR (mean 15.4), moderate PI, aortic root and ascending aorta 40 mm, RAP 15   Peripheral neuropathy    Persistent atrial fibrillation (HCC)    Pre-diabetes    S/P aortic valve replacement with prosthetic valve 08/03/2010   tissue valve   S/P Maze operation for atrial fibrillation 01/30/2018   Complete bilateral atrial lesion set using bipolar radiofrequency and cryothermy with clipping of LA appendage   S/P MVR (mitral valve repair) 01/30/2018   Complex valvuloplasty including artificial Gore-tex neochord placement x4 and Carbo medics Annuloflex ring annuloplasty, size 28   S/P patent foramen ovale closure 08/03/2010   at same time AVR   S/P tricuspid valve repair 01/30/2018   Using an MC3 Annuloplasty ring, size 28   Stroke (HCC)    Thrombocytopenia (HCC)    Tricuspid regurgitation     Past Surgical History:  Procedure Laterality Date   BIOPSY  09/07/2018   Procedure: BIOPSY;  Surgeon: Kathi Der, MD;  Location: [facility];  Service: Gastroenterology;;   CARDIAC CATHETERIZATION  06-02-2010  dr Anne Fu   non-obstructive cad- pLAD 30-40%/  normal LVSF/  severe AI   CARDIOVASCULAR STRESS TEST  04/12/2016   Low risk nuclear perfusion study w/ no significant reversible ischemia/  normal LV function and wall motion ,  stress ef 60%/  2mm inferior and lateral scooped ST-segment depression w/ exercise (may be repolarization abnormality), exercise capacity was moderately reduced   CATARACT EXTRACTION W/ INTRAOCULAR LENS  IMPLANT, BILATERAL  02/2010    CLIPPING OF ATRIAL APPENDAGE N/A 01/30/2018   Procedure: CLIPPING OF LEFT ATRIAL APPENDAGE USING ATRICLIP PRO2 ;  Surgeon: Purcell Nails, MD;  Location: [facility] OR;  Service: Open Heart Surgery;  Laterality: N/A;   COLONOSCOPY WITH PROPOFOL N/A 10/21/2018   Procedure: COLONOSCOPY WITH PROPOFOL;  Surgeon: Kathi Der, MD;  Location: [facility];  Service: Gastroenterology;  Laterality: N/A;   CYSTOSCOPY WITH LITHOLAPAXY N/A 06/05/2016   Procedure: CYSTOSCOPY WITH LITHOLAPAXY and fulgarization of bladder neck;  Surgeon: Bjorn Pippin, MD;  Location: [facility];  Service: Urology;  Laterality: N/A;   ESOPHAGOGASTRODUODENOSCOPY (EGD) WITH PROPOFOL N/A 09/07/2018   Procedure: ESOPHAGOGASTRODUODENOSCOPY (EGD) WITH PROPOFOL;  Surgeon: Kathi Der, MD;  Location: [facility];  Service: Gastroenterology;  Laterality: N/A;   EXPLORATORY LAPARTOMY /  CHOLECYSTECTOMY  02/28/2005   for Small  bowel obstruction (mechnical)   IR ANGIO EXTRACRAN SEL COM CAROTID INNOMINATE UNI L MOD SED  10/24/2016   IR ANGIO VERTEBRAL SEL SUBCLAVIAN INNOMINATE BILAT MOD SED  10/24/2016   IR PERCUTANEOUS ART THROMBECTOMY/INFUSION INTRACRANIAL INC DIAG ANGIO  10/24/2016   IR RADIOLOGIST EVAL & MGMT  12/05/2016   LEFT KNEE ARTHROSCOPY  2006   MAZE N/A 01/30/2018   Procedure: MAZE;  Surgeon: Purcell Nails, MD;  Location: [facility] OR;  Service: Open Heart Surgery;  Laterality: N/A;   MITRAL VALVE REPAIR N/A 01/30/2018   Procedure: MITRAL VALVE REPAIR (MVR) USING CARBOMEDICS ANNULOFLEX SIZE 28;  Surgeon: Purcell Nails, MD;  Location: [facility] OR;  Service: Open Heart Surgery;  Laterality: N/A;   POLYPECTOMY  10/21/2018   Procedure: POLYPECTOMY;  Surgeon: Kathi Der, MD;  Location[facility];  Service: Gastroenterology;;   RADIOLOGY WITH ANESTHESIA N/A 10/24/2016   Procedure: RADIOLOGY WITH ANESTHESIA;  Surgeon: Julieanne Cotton, MD;  Location: [facility] OR;  Service: Radiology;  Laterality: N/A;   RIGHT FOOT  SURGERY     RIGHT MINIATURE ANTERIOR THORACOTOMY FOR AORTIC VALVE REPLACEMENT AND CLOSURE PATENT FORAMEN OVALE  08-03-2010  DR Neldon Labella Magna-ease pericardial tissue valve (25mm)   RIGHT/LEFT HEART CATH AND CORONARY ANGIOGRAPHY N/A 10/14/2017   Procedure: RIGHT/LEFT HEART CATH AND CORONARY ANGIOGRAPHY;  Surgeon: Tonny Bollman, MD;  Location: [facility] INVASIVE CV LAB;  Service: Cardiovascular;  Laterality: N/A;   TEE WITHOUT CARDIOVERSION N/A 10/14/2017   Procedure: TRANSESOPHAGEAL ECHOCARDIOGRAM (TEE);  Surgeon: Wendall Stade, MD;  Location: [facility] ENDOSCOPY;  Service: Cardiovascular;  Laterality: N/A;   TOTAL HIP ARTHROPLASTY Left 08/02/2020   Procedure: TOTAL HIP ARTHROPLASTY ANTERIOR APPROACH;  Surgeon: Durene Romans, MD;  Location: [facility];  Service: Orthopedics;  Laterality: Left;   TOTAL KNEE ARTHROPLASTY Left 09/19/2021   Procedure: TOTAL KNEE ARTHROPLASTY;  Surgeon: Durene Romans, MD;  Location: [facility];  Service: Orthopedics;  Laterality: Left;   TOTAL KNEE ARTHROPLASTY Right 08/09/2022   Procedure: TOTAL KNEE ARTHROPLASTY;  Surgeon: Durene Romans, MD;  Location: [facility];  Service: Orthopedics;  Laterality: Right;  90   TRANSTHORACIC ECHOCARDIOGRAM  05/30/2016  dr  skains   moderate  LVH ef 60-65%/  bioprothesis aortic valve present ,normal grandient and no AI /  mild MV calcification , moderate MR /  mild PR/ moderate TR/  PASP 77mmHg/ (RA denisty was identified 04-27-2016 echo) and is seen again today, this is likely a promient eustacian ridge, atrium is normal size   TRICUSPID VALVE REPLACEMENT N/A 01/30/2018   Procedure: TRICUSPID VALVE REPAIR USING MC3 SIZE 28;  Surgeon: Purcell Nails, MD;  Location: [facility] OR;  Service: Open Heart Surgery;  Laterality: N/A;    Medications:  Scheduled Meds:  sodium chloride   Intravenous Once   allopurinol  50 mg Oral q morning   arformoterol  15 mcg Nebulization BID   budesonide (PULMICORT) nebulizer solution  0.25 mg Nebulization BID   Chlorhexidine  Gluconate Cloth  6 each Topical Daily   docusate  100 mg Oral Daily   feeding supplement  1 Bottle Oral TID BM   ipratropium-albuterol  3 mL Nebulization Q6H   metoCLOPramide (REGLAN) injection  5 mg Intravenous Q8H   mouth rinse  15 mL Mouth Rinse 4 times per day   pantoprazole (PROTONIX) IV  40 mg Intravenous Q12H   sodium chloride flush  10-40 mL Intracatheter Q12H   sodium chloride flush  3-10 mL Intravenous Q12H   sodium chloride HYPERTONIC  4 mL Nebulization Q6H   Continuous Infusions:  acetaminophen     ceFEPime (MAXIPIME) IV     norepinephrine (LEVOPHED) Adult infusion 3 mcg/min (02/06/23 1000)   PRN Meds:.acetaminophen, acetaminophen **OR** acetaminophen, albuterol, ondansetron **OR** ondansetron (ZOFRAN) IV, mouth rinse, polyvinyl alcohol, sodium chloride flush, sodium chloride flush, sodium phosphate, traZODone  Allergies:  Allergies  Allergen Reactions   Sulfa Antibiotics Other (See Comments)    Granulocytosis   Sulfamethoxazole-Trimethoprim     Other Reaction(s): Unknown   Zestril [Lisinopril] Cough    Family History  Problem Relation Age of Onset   Heart disease Mother    Brain cancer Father     Social History:  reports that he has never smoked. He has never used smokeless tobacco. He reports current alcohol use. He reports that he does not use drugs.  ROS: A complete review of systems was performed.  All systems are negative except for pertinent findings as noted.  Physical Exam:  Vital signs in last 24 hours: Temp:  [97.7 F (36.5 C)-100.8 F (38.2 C)] 97.7 F (36.5 C) (01/29 1200) Pulse Rate:  [58-89] 69 (01/29 1445) Resp:  [11-37] 26 (01/29 1445) BP: (75-195)/(23-167) 109/52 (01/29 1445) SpO2:  [86 %-100 %] 95 % (01/29 1445) FiO2 (%):  [21 %-28 %] 21 % (01/29 1419) Weight:  [72.8 kg] 72.8 kg (01/29 0500) GENERAL APPEARANCE:  Well appearing, well developed, well nourished, NAD HEENT:  Atraumatic, normocephalic, oropharynx clear NECK:  Supple  without lymphadenopathy or thyromegaly ABDOMEN:  Soft, non-tender, no masses EXTREMITIES:  Moves all extremities well, without clubbing, cyanosis, or edema NEUROLOGIC:  Alert and oriented x 3, CN II-XII grossly intact MENTAL STATUS:  appropriate BACK:  Non-tender to palpation, No CVAT SKIN:  Warm, dry, and intact  Laboratory Data:  Recent Labs    02/03/23 2348 02/04/23 0814 02/05/23 0255 02/06/23 0700  WBC 23.1* 23.7* 34.7* 16.9*  HGB 9.4* 8.8* 8.6* 8.0*  HCT 30.8* 28.1* 27.1* 25.7*  PLT 73* 61* 45* 27*    Recent Labs    02/03/23 2348 02/04/23 0814 02/05/23 0255 02/06/23 0700  NA 145 146* 145 149*  K 5.3* 4.6 4.1 3.8  CL 110 111 112* 114*  GLUCOSE 139* 145* 192* 297*  BUN 63* 72* 87* 95*  CALCIUM 8.6* 8.3* 8.0* 8.4*  CREATININE 2.51* 2.39* 2.77* 2.64*     Results for orders placed or performed during the hospital encounter of 02/02/23 (from the past 24 hours)  Glucose, capillary     Status: Abnormal   Collection Time: 02/05/23  8:00 PM  Result Value Ref Range   Glucose-Capillary 177 (H) 70 - 99 mg/dL  Glucose, capillary     Status: Abnormal   Collection Time: 02/06/23  4:37 AM  Result Value Ref Range   Glucose-Capillary 143 (H) 70 - 99 mg/dL  ABO/Rh     Status: None   Collection Time: 02/06/23  6:53 AM  Result Value Ref Range   ABO/RH(D)      O POS Performed at [facility], [address] [place], [place] [address]   Basic metabolic panel     Status: Abnormal   Collection Time: 02/06/23  7:00 AM  Result Value Ref Range   Sodium 149 (H) 135 - 145 mmol/L   Potassium 3.8 3.5 - 5.1 mmol/L   Chloride 114 (H) 98 - 111 mmol/L   CO2 22 22 - 32 mmol/L   Glucose, Bld 297 (H) 70 - 99 mg/dL   BUN 95 (H) 8 - 23 mg/dL   Creatinine, Ser 6.21 (H) 0.61 - 1.24 mg/dL   Calcium 8.4 (L) 8.9 - 10.3 mg/dL   GFR, Estimated 23 (L) >60 mL/min   Anion gap 13 5 - 15  CBC     Status: Abnormal   Collection Time: 02/06/23  7:00 AM  Result Value Ref Range    WBC 16.9 (H) 4.0 - 10.5 K/uL   RBC 2.68 (L) 4.22 - 5.81 MIL/uL   Hemoglobin 8.0 (L) 13.0 - 17.0 g/dL   HCT 30.8 (L) 65.7 - 84.6 %   MCV 95.9 80.0 - 100.0 fL   MCH 29.9 26.0 - 34.0 pg   MCHC 31.1 30.0 - 36.0 g/dL   RDW 96.2 (H) 95.2 - 84.1 %   Platelets 27 (LL) 150 - 400 K/uL   nRBC 0.0 0.0 - 0.2 %  Magnesium     Status: Abnormal   Collection Time: 02/06/23  7:00 AM  Result Value Ref Range   Magnesium 2.9 (H) 1.7 - 2.4 mg/dL  Phosphorus     Status: None   Collection Time: 02/06/23  7:00 AM  Result Value Ref Range   Phosphorus 2.8 2.5 - 4.6 mg/dL  Protime-INR     Status: Abnormal   Collection Time: 02/06/23  7:00 AM  Result Value Ref Range   Prothrombin Time 30.4 (H) 11.4 - 15.2 seconds   INR 2.9 (H) 0.8 - 1.2  Glucose, capillary     Status: Abnormal   Collection Time: 02/06/23  7:19 AM  Result Value Ref Range   Glucose-Capillary 145 (H) 70 - 99 mg/dL   Comment 1 Notify RN   Glucose, capillary     Status: Abnormal   Collection Time: 02/06/23 11:41 AM  Result Value Ref Range   Glucose-Capillary 259 (H) 70 - 99 mg/dL   Comment 1 Notify RN   Glucose, capillary     Status: Abnormal   Collection Time: 02/06/23  3:52 PM  Result Value Ref Range   Glucose-Capillary 232 (H) 70 - 99 mg/dL  Prepare platelet pheresis     Status: None (Preliminary result)   Collection Time: 02/06/23  3:56 PM  Result Value Ref Range   Unit Number [id number]    Blood Component Type PLTP3 PSORALEN TREATED    Unit division 00    Status of Unit ALLOCATED    Transfusion Status OK TO TRANSFUSE    Recent Results (from the past 240 hours)  Resp panel by RT-PCR (RSV, Flu A&B, Covid) Anterior Nasal Swab     Status: None   Collection Time: 02/02/23  7:30 AM   Specimen: Anterior Nasal Swab  Result Value Ref Range Status   SARS Coronavirus 2 by RT PCR NEGATIVE NEGATIVE Final    Comment: (NOTE) SARS-CoV-2 target nucleic acids are NOT DETECTED.  The SARS-CoV-2 RNA is generally detectable in upper  respiratory specimens during the acute phase of infection. The lowest concentration of SARS-CoV-2 viral copies this assay can detect is 138 copies/mL. A negative result does not preclude SARS-Cov-2 infection and should not be used as the sole basis for treatment or other patient management decisions. A negative result may occur with  improper specimen collection/handling, submission of specimen other than nasopharyngeal swab, presence of viral mutation(s) within the areas targeted by this assay, and inadequate number of viral copies(<138 copies/mL). A negative result must be combined with clinical observations, patient history, and epidemiological information. The expected result is Negative.  Fact Sheet for Patients:  BloggerCourse.com  Fact Sheet for Healthcare Providers:  SeriousBroker.it  This test is no t yet approved or cleared by the [place] FDA and  has been authorized for detection and/or diagnosis of SARS-CoV-2 by FDA under an Emergency Use Authorization (EUA). This EUA will remain  in effect (meaning this test can be used) for the duration of the COVID-19 declaration under Section 564(b)(1) of the Act, 21 U.S.C.section 360bbb-3(b)(1), unless the authorization is terminated  or revoked sooner.       Influenza A by PCR NEGATIVE NEGATIVE Final   Influenza B by PCR NEGATIVE NEGATIVE Final    Comment: (NOTE) The Xpert Xpress SARS-CoV-2/FLU/RSV plus assay is intended as an aid in the diagnosis of influenza from Nasopharyngeal swab specimens and should not be used as a sole basis for treatment. Nasal washings and aspirates are unacceptable for Xpert Xpress SARS-CoV-2/FLU/RSV testing.  Fact Sheet for Patients: BloggerCourse.com  Fact Sheet for Healthcare Providers: SeriousBroker.it  This test is not yet approved or cleared by the [place] FDA and has been  authorized for detection and/or diagnosis of SARS-CoV-2 by FDA under an Emergency Use Authorization (EUA). This EUA will remain in effect (meaning this test can be used) for the duration of the COVID-19 declaration under Section 564(b)(1) of the Act, 21 U.S.C. section 360bbb-3(b)(1), unless the authorization is terminated or revoked.     Resp Syncytial Virus by PCR NEGATIVE NEGATIVE Final    Comment: (NOTE) Fact Sheet for Patients: BloggerCourse.com  Fact Sheet for Healthcare Providers: SeriousBroker.it  This test is not yet approved or cleared by the [place] FDA and has been authorized for detection and/or diagnosis of SARS-CoV-2 by FDA under an Emergency Use Authorization (EUA). This EUA will remain in effect (meaning this test can be used) for the duration of the COVID-19 declaration under Section 564(b)(1) of the Act, 21 U.S.C. section 360bbb-3(b)(1), unless the authorization is terminated or revoked.  Performed at [facility], [address] [place], [place] [address]   Urine Culture (for pregnant, neutropenic or urologic patients or patients with an indwelling urinary catheter)     Status: Abnormal   Collection Time: 02/03/23 11:25 AM  Specimen: Urine, Clean Catch  Result Value Ref Range Status   Specimen Description   Final    URINE, CLEAN CATCH Performed at [facility], [address] [place], [place] [address]    Special Requests   Final    NONE Performed at [facility], [address], [place], [place] [address]    Culture >=100,000 COLONIES/mL ENTEROBACTER SPECIES (A)  Final   Report Status 02/05/2023 FINAL  Final   Organism ID, Bacteria ENTEROBACTER SPECIES (A)  Final      Susceptibility   Enterobacter species - MIC*    CEFEPIME <=0.12 SENSITIVE Sensitive     CIPROFLOXACIN <=0.25 SENSITIVE Sensitive     GENTAMICIN <=1 SENSITIVE Sensitive      IMIPENEM 1 SENSITIVE Sensitive     NITROFURANTOIN <=16 SENSITIVE Sensitive     TRIMETH/SULFA <=20 SENSITIVE Sensitive     PIP/TAZO <=4 SENSITIVE Sensitive ug/mL    * >=100,000 COLONIES/mL ENTEROBACTER SPECIES  Culture, blood (Routine X 2) w Reflex to ID Panel     Status: Abnormal   Collection Time: 02/03/23  5:53 PM   Specimen: BLOOD LEFT ARM  Result Value Ref Range Status   Specimen Description   Final    BLOOD LEFT ARM AEROBIC BOTTLE ONLY Performed at [facility], [address] [place], [place] [address]    Special Requests   Final    BOTTLES DRAWN AEROBIC AND ANAEROBIC Blood Culture results may not be optimal due to an inadequate volume of blood received in culture bottles Performed at [facility], [address] [place], [place] [address]    Culture  Setup Time (A)  Final    GRAM VARIABLE ROD AEROBIC BOTTLE ONLY CRITICAL VALUE NOTED.  VALUE IS CONSISTENT WITH PREVIOUSLY REPORTED AND CALLED VALUE.    Culture (A)  Final    ENTEROBACTER CLOACAE SUSCEPTIBILITIES PERFORMED ON PREVIOUS CULTURE WITHIN THE LAST 5 DAYS. Performed at [facility], [address] [place], [place] [address]    Report Status 02/06/2023 FINAL  Final  Culture, blood (Routine X 2) w Reflex to ID Panel     Status: Abnormal   Collection Time: 02/03/23  5:53 PM   Specimen: BLOOD LEFT ARM  Result Value Ref Range Status   Specimen Description   Final    BLOOD LEFT ARM AEROBIC BOTTLE ONLY Performed at [facility], [address], [place], [place] [address]    Special Requests   Final    BOTTLES DRAWN AEROBIC AND ANAEROBIC Blood Culture results may not be optimal due to an inadequate volume of blood received in culture bottles Performed at [facility], [address], [place], [place] [address]    Culture  Setup Time (A)  Final    GRAM VARIABLE ROD AEROBIC BOTTLE ONLY CRITICAL RESULT CALLED TO, READ BACK BY AND VERIFIED WITH: Beckey Rutter D [id number]  [id number] FCP  Performed at [facility], [address] [place], [place] [address]    Culture ENTEROBACTER CLOACAE (A)  Final   Report Status 02/06/2023 FINAL  Final   Organism ID, Bacteria ENTEROBACTER CLOACAE  Final      Susceptibility   Enterobacter cloacae - MIC*    CEFEPIME <=0.12 SENSITIVE Sensitive     CEFTAZIDIME <=1 SENSITIVE Sensitive     CIPROFLOXACIN <=0.25 SENSITIVE Sensitive     GENTAMICIN <=1 SENSITIVE Sensitive     IMIPENEM 1 SENSITIVE Sensitive  TRIMETH/SULFA <=20 SENSITIVE Sensitive     PIP/TAZO 8 SENSITIVE Sensitive ug/mL    * ENTEROBACTER CLOACAE  Blood Culture ID Panel (Reflexed)     Status: Abnormal   Collection Time: 02/03/23  5:53 PM  Result Value Ref Range Status   Enterococcus faecalis NOT DETECTED NOT DETECTED Final   Enterococcus Faecium NOT DETECTED NOT DETECTED Final   Listeria monocytogenes NOT DETECTED NOT DETECTED Final   Staphylococcus species NOT DETECTED NOT DETECTED Final   Staphylococcus aureus (BCID) NOT DETECTED NOT DETECTED Final   Staphylococcus epidermidis NOT DETECTED NOT DETECTED Final   Staphylococcus lugdunensis NOT DETECTED NOT DETECTED Final   Streptococcus species NOT DETECTED NOT DETECTED Final   Streptococcus agalactiae NOT DETECTED NOT DETECTED Final   Streptococcus pneumoniae NOT DETECTED NOT DETECTED Final   Streptococcus pyogenes NOT DETECTED NOT DETECTED Final   A.calcoaceticus-baumannii NOT DETECTED NOT DETECTED Final   Bacteroides fragilis NOT DETECTED NOT DETECTED Final   Enterobacterales DETECTED (A) NOT DETECTED Final    Comment: Enterobacterales represent a large order of gram negative bacteria, not a single organism. CRITICAL RESULT CALLED TO, READ BACK BY AND VERIFIED WITH: PHARMD SYDNEY D [id number] FCP     Enterobacter cloacae complex DETECTED (A) NOT DETECTED Final    Comment: CRITICAL RESULT CALLED TO, READ BACK BY AND VERIFIED WITH: PHARMD SYDNEY D [id number] FCP     Escherichia coli NOT DETECTED  NOT DETECTED Final   Klebsiella aerogenes NOT DETECTED NOT DETECTED Final   Klebsiella oxytoca NOT DETECTED NOT DETECTED Final   Klebsiella pneumoniae NOT DETECTED NOT DETECTED Final   Proteus species NOT DETECTED NOT DETECTED Final   Salmonella species NOT DETECTED NOT DETECTED Final   Serratia marcescens NOT DETECTED NOT DETECTED Final   Haemophilus influenzae NOT DETECTED NOT DETECTED Final   Neisseria meningitidis NOT DETECTED NOT DETECTED Final   Pseudomonas aeruginosa NOT DETECTED NOT DETECTED Final   Stenotrophomonas maltophilia NOT DETECTED NOT DETECTED Final   Candida albicans NOT DETECTED NOT DETECTED Final   Candida auris NOT DETECTED NOT DETECTED Final   Candida glabrata NOT DETECTED NOT DETECTED Final   Candida krusei NOT DETECTED NOT DETECTED Final   Candida parapsilosis NOT DETECTED NOT DETECTED Final   Candida tropicalis NOT DETECTED NOT DETECTED Final   Cryptococcus neoformans/gattii NOT DETECTED NOT DETECTED Final   CTX-M ESBL NOT DETECTED NOT DETECTED Final   Carbapenem resistance IMP NOT DETECTED NOT DETECTED Final   Carbapenem resistance KPC NOT DETECTED NOT DETECTED Final   Carbapenem resistance NDM NOT DETECTED NOT DETECTED Final   Carbapenem resist OXA 48 LIKE NOT DETECTED NOT DETECTED Final   Carbapenem resistance VIM NOT DETECTED NOT DETECTED Final    Comment: Performed at [facility], [address], [place], [place] [address]  Urine Culture     Status: Abnormal (Preliminary result)   Collection Time: 02/04/23  1:18 AM   Specimen: Urine, Random  Result Value Ref Range Status   Specimen Description   Final    URINE, RANDOM Performed at [facility], [address] [place], [place] [address]    Special Requests   Final    NONE Reflexed from [id number] Performed at [facility], [address] [place], [place] [address]    Culture (A)  Final    >=100,000 COLONIES/mL ENTEROBACTER SPECIES IDENTIFICATION AND  SUSCEPTIBILITIES TO FOLLOW Performed at [facility], [address] [place], [place] [address]    Report Status PENDING  Incomplete  MRSA Next Gen by PCR, Nasal     Status: None   Collection Time: 02/04/23  1:52 AM   Specimen: Nasal Mucosa; Nasal Swab  Result Value Ref Range Status   MRSA by PCR Next Gen NOT DETECTED NOT DETECTED Final    Comment: (NOTE) The GeneXpert MRSA Assay (FDA approved for NASAL specimens only), is one component of a comprehensive MRSA colonization surveillance program. It is not intended to diagnose MRSA infection nor to guide or monitor treatment for MRSA infections. Test performance is not FDA approved in patients less than 14 years old. Performed at [facility], [address] [place], [place] [address]     Renal Function: Recent Labs    02/02/23 0806 02/03/23 0402 02/03/23 2348 02/04/23 0814 02/05/23 0255 02/06/23 0700  CREATININE 1.19 1.47* 2.51* 2.39* 2.77* 2.64*   Estimated Creatinine Clearance: 21.1 mL/min (A) (by C-G formula based on SCr of 2.64 mg/dL (H)).  Radiologic Imaging: CT RENAL STONE STUDY Result Date: 02/06/2023 CLINICAL DATA:  Abdominal/flank pain.  Concern for kidney stone. EXAM: CT ABDOMEN AND PELVIS WITHOUT CONTRAST TECHNIQUE: Multidetector CT imaging of the abdomen and pelvis was performed following the standard protocol without IV contrast. RADIATION DOSE REDUCTION: This exam was performed according to the departmental dose-optimization program which includes automated exposure control, adjustment of the mA and/or kV according to patient size and/or use of iterative reconstruction technique. COMPARISON:  CT abdomen pelvis dated 09/23/2021. FINDINGS: Evaluation of this exam is limited in the absence of intravenous contrast. Lower chest: Small bilateral pleural effusions with associated partial compressive atelectasis of the lung bases. Mild cardiomegaly. Tricuspid mechanical bowel. Coronary vascular  calcification. No intra-abdominal free air or free fluid. Hepatobiliary: The liver is unremarkable. No biliary dilatation. Cholecystectomy. No retained calcified stone noted in the central CBD. Pancreas: Unremarkable. No pancreatic ductal dilatation or surrounding inflammatory changes. Spleen: Normal in size without focal abnormality. Adrenals/Urinary Tract: The adrenal glands unremarkable. There is a 12 mm stone in the proximal right ureter close to the ureteropelvic junction with mild right hydronephrosis. Several additional nonobstructing right renal calculi measure up to 1 cm in the interpolar aspect of the right kidney. Multiple right renal cysts are poorly characterized on this noncontrast CT but were present on the prior CT. A 12 mm high attenuating exophytic cyst from the inferior pole of the right kidney likely represents a hemorrhagic or proteinaceous cyst. There has been interval development of high attenuating content within a exophytic 8.5 cm cyst arising from the inferior pole of the right kidney consistent with hemorrhage. The possibility of active lesional is not excluded but cannot be evaluated on this noncontrast CT. No significant perinephric fluid. There is no hydronephrosis or nephrolithiasis on the left. The left ureter is unremarkable. The urinary bladder is decompressed around a Foley catheter. Stomach/Bowel: There is sigmoid diverticulosis. There is no bowel obstruction or active inflammation. The appendix is normal. Vascular/Lymphatic: Mild aortoiliac atherosclerotic disease. The IVC is unremarkable. No portal gas. There is no adenopathy. Reproductive: Mildly enlarged prostate gland measuring 5 cm in transverse axial diameter. The seminal vesicles are symmetric. Other: Midline vertical anterior abdominal wall incisional scar. There is mild asymmetric enlargement of the left iliacus muscle with an ill-defined high attenuating area suspicious for an intramuscular bleed. Slight asymmetric  enlargement of the left psoas muscle also noted. Musculoskeletal: Small fat containing midline supraumbilical hernia. There is osteopenia with degenerative changes of the spine. No acute osseous pathology. Total left hip arthroplasty. IMPRESSION: 1.  A 12 mm proximal right ureteral stone with mild right hydronephrosis. 2. Additional nonobstructing right renal calculi. 3. Hemorrhage within the dominant 8.5 cm cyst in the inferior pole of the right kidney. Active bleed or underlying malignancy is not excluded but cannot be evaluated on this noncontrast CT. Urology consult and follow-up recommended to exclude a bleeding mass. 4. Small intramuscular bleed within the left iliopsoas. Correlation with anticoagulation status recommended. 5. Sigmoid diverticulosis. No bowel obstruction. Normal appendix. 6. Small bilateral pleural effusions with associated partial compressive atelectasis of the lung bases. 7.  Aortic Atherosclerosis (ICD10-I70.0). Electronically Signed   By: Elgie Collard M.D.   On: 02/06/2023 10:54   DG CHEST PORT 1 VIEW Result Date: 02/05/2023 CLINICAL DATA:  PICC placement. EXAM: PORTABLE CHEST 1 VIEW COMPARISON:  Chest radiograph dated 02/04/2023. FINDINGS: Right-sided PICC with tip over central SVC. Improved aeration of the lungs compared to prior radiograph. No focal consolidation or pneumothorax. Trace bilateral pleural effusions suspected. Stable cardiac silhouette. Median sternotomy wires and mechanical cardiac valve. No acute osseous pathology. IMPRESSION: 1. Right-sided PICC with tip over central SVC. 2. Improved aeration of the lungs. Electronically Signed   By: Elgie Collard M.D.   On: 02/05/2023 15:12   [place] EKG SITE RITE Result Date: 02/05/2023 If Site Rite image not attached, placement could not be confirmed due to current cardiac rhythm.   I independently reviewed the above imaging studies.  Impression/Recommendation Right ureteral calculus Nephrolithiasis Enterobacter  sepsis Renal cysts  I reviewed the patient's chart as well as the CT scan from today. Given the findings of an obstructing ureteral calculus in the setting of a UTI, I recommended a procedure for decompression of the right kidney.  I discussed the options of cystoscopy with right ureteral stent placement or percutaneous nephrostomy tube placement.  Given his current coagulopathy, he would not be a candidate for nephrostomy tube placement.  He is interested in proceeding with cystoscopy with right ureteral stent placement.  I discussed this procedure including potential risk with him.  He understands wishes to proceed as described. Will arrange for this procedure to be performed tomorrow. Dr. Marlou Porch will perform this procedure.  Procedure: The patient will be scheduled for cystoscopy, right retrograde pyelogram, right ureteral stent placement at [facility].  Surgical request is placed with the surgery schedulers and will be scheduled at the patient's/family request. Informed consent is given as documented below. Anesthesia: General  Consent for Operation or Procedure: Provider Certification I hereby certify that the nature, purpose, benefits, usual and most frequent risks of, and alternatives to, the operation or procedure have been explained to the patient (or person authorized to sign for the patient) either by me as responsible physician or by the provider who is to perform the operation or procedure. Time spent such that the patient/family has had an opportunity to ask questions, and that those questions have been answered. The patient or the patient's representative has been advised that selected tasks may be performed by assistants to the primary health care provider(s). I believe that the patient (or person authorized to sign for the patient) understands what has been explained, and has consented to the operation or procedure. No guarantees were implied or made.   Di Kindle 02/06/2023, 4:53 PM

## 2023-02-06 NOTE — Progress Notes (Signed)
NAME:  [name]., MRN:  [id number], DOB:  12-31-1935, LOS: 3 ADMISSION DATE:  02/02/2023, CONSULTATION DATE:  02/04/23 REFERRING MD:  [name] CHIEF COMPLAINT:  Dyspnea   History of Present Illness:  [name]. is a 88 y.o. male who has an extensive PMH as below. He presented to [facility] ED 1/25 with dyspnea that woke him from his sleep the night prior. He had also complained of LE edema over the preceding few weeks. On EMS arrival, he had sats of 85% on room air. He was placed on [name] and given Solumedrol and DuoNebs in route to ED>  In ED, CXR was suggestive of mild edema. He was admitted by [facility] and started on diuresis. He was seen by cardiology in consultation who agreed with diuresis for fluid overload and obtaining an echo. Echo was obtained and demonstrated EF 60-65%, mod LVH, RV pressure overload with RVSP 61, LA and RA severely dilated.  On evening of 1/26, he developed nausea and vomiting with probable aspiration event. There was concern for SBO and was seen by general surgery who recommended continuing NPO and continuing NGT to suction.  On 1/27, diuresis was held due to bump in renal function after IV lasix. He was only net negative since admit. He did not appear to have excess volume per cardiology notes and appeared improved from CHF standpoint. Dyspnea and hypoxia were felt to more likely be related to aspiration PNA. General surgery had also seen and he had benign exam and was passing flatus. He unfortunately pulled out his NGT that day.  After pulling out NGT 1/27, he had episode of tachypnea and increased WOB along with spike in temp to 102.61F. Due to increased WOB, PCCM asked to see in consultation. He had been on 4L  prior to this episode. Blood cultures from admission also returned positive for Enterobacter cloacae. He was on Cefepime but after discussion with TRH, we will escalate to Meropenem.  On our arrival, he appears tachypneic but subjectively  feels that breathing is "OK". He appears globally weak. His wife and brother at bedside along with caregiver. They tell me that he had been aspirating on food for at least a week prior to his admit and complained of "food going down the wrong pipe" followed by coughing spells. Denies ever being told he had dysphagia and needed a specific diet.  I called pt's son [name] who is HCPOA and discussed code status and goals of care. See separate IPAL note but in short, Pt had made it clear in recent times that if he were hospitalized and had a condition that was not reversible or had a poor prognosis, he would not want to prolong things or be placed on life support devices. While I explained to [name] that while antibiotics should help with his aspiration and bacteremia, I was concerned with his work of breathing and him fatiguing. We agreed that if he reached the point of needing mechanical ventilation, the procedure itself would pose risks including hypotension/shock and possible cardiac arrest given his cardiac history with severe PAH etc. We also agreed that CPR would certainly cause more harm and suffering and would not be in line with pt's wishes. In light of this, we agreed to place DNR/DNI orders on the chart and to continue with ongoing supportive measures in hopes that with time, abx and supportive care will be enough to get pt back to his prior state of health.  Pertinent  Medical  History:  has Essential hypertension; Gout; History of small bowel obstruction; History of BPH; S/P AVR; Hypokalemia; Left hemiparesis (HCC); Dysphagia, Ayers-stroke; S/P AVR (aortic valve replacement); Benign prostatic hyperplasia with lower urinary tract symptoms; History of ischemic right MCA stroke; Permanent atrial fibrillation (HCC); Hyperglycemia; Mitral valve regurgitation; Tricuspid regurgitation; s/p mitral valve repair; S/P tricuspid valve repair; S/P Maze operation for atrial fibrillation; Long term current use of  anticoagulant; Anemia; Anemia due to chronic blood loss; Acquired dilation of ascending aorta and aortic root (HCC); Chronic congestive heart failure (HCC); Elevated PSA; History of adenomatous polyp of colon; History of anemia; Hypercoagulable state (HCC); Impairment of balance; Peripheral neuropathy; Personal history of other malignant neoplasm of skin; Prediabetes; Presence of prosthetic heart valve; Sleep disorder; Thrombocytopenia (HCC); Vitamin D deficiency; Hypertrophy of prostate with urinary obstruction and other lower urinary tract symptoms (LUTS); Closed left hip fracture, initial encounter (HCC); Malnutrition of moderate degree; S/P left total hip arthroplasty; CAD (coronary artery disease); Cerebrovascular accident (CVA) (HCC); SIRS (systemic inflammatory response syndrome) (HCC); Generalized weakness; Otitis externa; Valvular heart disease; Hypoalbuminemia due to protein-calorie malnutrition (HCC); Abnormal findings on diagnostic imaging of other specified body structures; S/P total knee arthroplasty, left; Symptomatic anemia; HLD (hyperlipidemia); Hematuria; Weakness; Urinary catheter in place; COVID-19 virus infection; Other secondary pulmonary hypertension (HCC); Preoperative cardiovascular examination; Gait abnormality; Bilateral foot-drop; S/P total knee arthroplasty, right; Acute respiratory failure with hypoxia (HCC); Elevated troponin; Volume overload; Aspiration pneumonia (HCC); and DNR (do not resuscitate) on their problem list.  Significant Hospital Events: Including procedures, antibiotic start and stop dates in addition to other pertinent events   1/25 admit 1/27 pulled out NGT and had episode of respiratory distress along with spike in temp to 102.30F. PCCM consulted. 1/27 Blood cultures returned positive for Enterobacter cloacae. 1/27 worsening shock, started on NE with escalating doses, comfort meds placed 1/28 much improved, more awake and interactive, NE down to 9. CVP 6, coox  85. 1/28 discussion with pt and family regarding eating. Had failed SLP eval but pt adamant to eat. Discussed risks/benefits and agreed to let pt drink liquids with supervision, if does well then can move to regular diet   Interim History / Subjective:  On HFNC 4L. Feels good. Tolerating Ensure fine, asking for another Ensure. CT renal still pending, have asked RN to call down and get this done today.  Objective:  Blood pressure (!) 108/56, pulse 65, temperature 97.7 F (36.5 C), resp. rate 18, height 6' (1.829 m), weight 72.8 kg, SpO2 100%. CVP:  [2 mmHg-13 mmHg] 2 mmHg      Intake/Output Summary (Last 24 hours) at 02/06/2023 0901 Last data filed at 02/06/2023 9562 Gross per 24 hour  Intake 608.64 ml  Output 1325 ml  Net -716.36 ml   Filed Weights   02/04/23 0438 02/05/23 0500 02/06/23 0500  Weight: 76.1 kg 75.1 kg 72.8 kg    Examination: General: Elderly male, chronically ill appearing. Neuro: Awake, alert, answers questions appropriately, MAE's. HEENT: [place]/AT. Sclerae anicteric. EOMI. Cardiovascular: IRIR, 3/6 SEM.  Lungs: Respirations normal. CTAB. Abdomen: BS x 4, soft, NT/ND.  Musculoskeletal: Some muscle wasting. No gross deformities, no edema.  Skin: Intact, warm, no rashes.  Labs/imaging personally reviewed:  Echo 1/26 > EF 60-65%, mod LVH, RV pressure overload with RVSP 61, LA and RA severely dilated CT renal 1/27 >   Assessment & Plan:   Acute hypoxic respiratory failure - multifactorial in setting aspiration PNA, fluid overload/CHF exacerbation, atelectasis. Doubt much CHF/fluid in play at this point  1/29, CVP and co-ox both reassuring. - Continue supplemental Ayers as needed to maintain SpO2 > 92%. - HHFNC with higher pressures for WOB (not necessarily needing high FiO2) as needed for work of breathing but currently doing well on 4L HFNC. - Avoiding BiPAP given recent vomiting and aspiration. - Continue Meropenem. - Don't think any role for CT chest at this  point, will d/c. - Continue hypertonic nebs and scheduled DuoNebs. - Agreed to let pt eat given goals of care etc, risks explained given that he failed SLP eval. Pt understands and wishes to proceed with liquid diet with supervision, can advance if tolerates. - PT eval.  Enterobacter cloacae bacteremia - 2/2 UTI. Imaging mentions possible renal calculi. Sensitivities back 1/29, panS. - Change abx from Meropenem to Cefepime. - F/u on CT renal study, might urology for stent if this is etiology of everything. Have asked RN to ensure CT gets done today 1/29.  Septic shock - 2/2 above. CVP normal and Coox normal at 85 on 1/28. - Continue NE, goal MAP > 65. - Follow CPV.  Ileus and less likely SBO - had NGT but pt removed this 1/27. He tells me he continues to pass flatus but has not had a BM yet. - Supportive care, continues to pass flatus so this appears to be resolving. - Continue Reglan. - Liquid diet for now, advance as able. - Add fleet enema 1/29 as yet to have BM. - Add daily Docusate.  AKI  - worsened after IV diuresis and shock state. Possible renal calculi - F/u on CT renal study. - Holding off on further diuresis at this point. - Follow BMP.  Goals of care. - Limited code: DNR/DNI in the event of arrest. But all other measures appropriate currently.  Best practice (evaluated daily):  Code Status:  DNR/DNI DVT prophylaxis: SCD's. With thrombocytopenia, and elevated INR, wait on chemical prophylaxis at least until INR < 2 (pharmacy following) Diet: SLP eval Activity: PT eval pending Lines: PICC, maintain foley Disposition : ICU   CC time: 30 min.  Rutherford Guys, PA - [facility] For pager details, please see AMION or use Epic chat  After 1900, please call [facility] for cross coverage needs 02/06/2023, 9:01 AM

## 2023-02-07 ENCOUNTER — Inpatient Hospital Stay (HOSPITAL_COMMUNITY): Payer: Medicare Other | Admitting: Certified Registered"

## 2023-02-07 ENCOUNTER — Inpatient Hospital Stay (HOSPITAL_COMMUNITY): Payer: Medicare Other

## 2023-02-07 ENCOUNTER — Encounter (HOSPITAL_COMMUNITY)
Admission: EM | Disposition: A | Discharge status: Skilled Nursing Facility | Payer: Self-pay | Source: Home / Self Care | Attending: Family Medicine

## 2023-02-07 ENCOUNTER — Encounter (HOSPITAL_COMMUNITY): Payer: Self-pay | Admitting: Internal Medicine

## 2023-02-07 DIAGNOSIS — E119 Type 2 diabetes mellitus without complications: Secondary | ICD-10-CM | POA: Diagnosis not present

## 2023-02-07 DIAGNOSIS — R7881 Bacteremia: Secondary | ICD-10-CM | POA: Diagnosis not present

## 2023-02-07 DIAGNOSIS — I4891 Unspecified atrial fibrillation: Secondary | ICD-10-CM

## 2023-02-07 DIAGNOSIS — I10 Essential (primary) hypertension: Secondary | ICD-10-CM | POA: Diagnosis not present

## 2023-02-07 DIAGNOSIS — N132 Hydronephrosis with renal and ureteral calculous obstruction: Secondary | ICD-10-CM | POA: Diagnosis not present

## 2023-02-07 DIAGNOSIS — N201 Calculus of ureter: Secondary | ICD-10-CM

## 2023-02-07 DIAGNOSIS — D649 Anemia, unspecified: Secondary | ICD-10-CM

## 2023-02-07 DIAGNOSIS — I2510 Atherosclerotic heart disease of native coronary artery without angina pectoris: Secondary | ICD-10-CM | POA: Diagnosis not present

## 2023-02-07 DIAGNOSIS — J9601 Acute respiratory failure with hypoxia: Secondary | ICD-10-CM | POA: Diagnosis not present

## 2023-02-07 HISTORY — PX: CYSTOSCOPY W/ URETERAL STENT PLACEMENT: SHX1429

## 2023-02-07 LAB — URINE CULTURE: Culture: >=100000 — AB

## 2023-02-07 LAB — GLUCOSE, CAPILLARY
Glucose-Capillary: 142 mg/dL — ABNORMAL HIGH (ref 70–99)
Glucose-Capillary: 158 mg/dL — ABNORMAL HIGH (ref 70–99)
Glucose-Capillary: 176 mg/dL — ABNORMAL HIGH (ref 70–99)
Glucose-Capillary: 181 mg/dL — ABNORMAL HIGH (ref 70–99)
Glucose-Capillary: 218 mg/dL — ABNORMAL HIGH (ref 70–99)
Glucose-Capillary: 278 mg/dL — ABNORMAL HIGH (ref 70–99)

## 2023-02-07 LAB — CBC
HCT: 26.7 % — ABNORMAL LOW (ref 39.0–52.0)
HCT: 23.2 % — ABNORMAL LOW (ref 39.0–52.0)
Hemoglobin: 8.5 g/dL — ABNORMAL LOW (ref 13.0–17.0)
Hemoglobin: 7.4 g/dL — ABNORMAL LOW (ref 13.0–17.0)
MCH: 29.9 pg (ref 26.0–34.0)
MCH: 29.7 pg (ref 26.0–34.0)
MCHC: 31.8 g/dL (ref 30.0–36.0)
MCHC: 31.9 g/dL (ref 30.0–36.0)
MCV: 94 fL (ref 80.0–100.0)
MCV: 93.2 fL (ref 80.0–100.0)
Platelets: 30 10*3/uL — ABNORMAL LOW (ref 150–400)
Platelets: 25 10*3/uL — CL (ref 150–400)
RBC: 2.84 MIL/uL — ABNORMAL LOW (ref 4.22–5.81)
RBC: 2.49 MIL/uL — ABNORMAL LOW (ref 4.22–5.81)
RDW: 17.1 % — ABNORMAL HIGH (ref 11.5–15.5)
RDW: 17.1 % — ABNORMAL HIGH (ref 11.5–15.5)
WBC: 5.5 10*3/uL (ref 4.0–10.5)
WBC: 20.8 10*3/uL — ABNORMAL HIGH (ref 4.0–10.5)
nRBC: 0 % (ref 0.0–0.2)
nRBC: 0 % (ref 0.0–0.2)

## 2023-02-07 LAB — BASIC METABOLIC PANEL
Anion gap: 12 (ref 5–15)
BUN: 99 mg/dL — ABNORMAL HIGH (ref 8–23)
CO2: 21 mmol/L — ABNORMAL LOW (ref 22–32)
Calcium: 8.9 mg/dL (ref 8.9–10.3)
Chloride: 123 mmol/L — ABNORMAL HIGH (ref 98–111)
Creatinine, Ser: 2.33 mg/dL — ABNORMAL HIGH (ref 0.61–1.24)
GFR, Estimated: 27 mL/min — ABNORMAL LOW (ref >60)
Glucose, Bld: 155 mg/dL — ABNORMAL HIGH (ref 70–99)
Potassium: 4.2 mmol/L (ref 3.5–5.1)
Sodium: 156 mmol/L — ABNORMAL HIGH (ref 135–145)

## 2023-02-07 LAB — BLOOD GAS, ARTERIAL
Acid-Base Excess: 1.7 mmol/L (ref 0.0–2.0)
Bicarbonate: 24.4 mmol/L (ref 20.0–28.0)
Delivery systems: POSITIVE
Drawn by: 21338
Expiratory PAP: 8 cm[H2O]
FIO2: 100 %
Inspiratory PAP: 18 cm[H2O]
O2 Saturation: 99 %
Patient temperature: 37
RATE: 18 {breaths}/min
pCO2 arterial: 32 mm[Hg] (ref 32–48)
pH, Arterial: 7.49 — ABNORMAL HIGH (ref 7.35–7.45)
pO2, Arterial: 330 mm[Hg] — ABNORMAL HIGH (ref 83–108)

## 2023-02-07 LAB — RENAL FUNCTION PANEL
Albumin: 2.2 g/dL — ABNORMAL LOW (ref 3.5–5.0)
Anion gap: 9 (ref 5–15)
BUN: 98 mg/dL — ABNORMAL HIGH (ref 8–23)
CO2: 22 mmol/L (ref 22–32)
Calcium: 8.2 mg/dL — ABNORMAL LOW (ref 8.9–10.3)
Chloride: 123 mmol/L — ABNORMAL HIGH (ref 98–111)
Creatinine, Ser: 2.33 mg/dL — ABNORMAL HIGH (ref 0.61–1.24)
GFR, Estimated: 27 mL/min — ABNORMAL LOW (ref >60)
Glucose, Bld: 314 mg/dL — ABNORMAL HIGH (ref 70–99)
Phosphorus: 3.3 mg/dL (ref 2.5–4.6)
Potassium: 4.9 mmol/L (ref 3.5–5.1)
Sodium: 154 mmol/L — ABNORMAL HIGH (ref 135–145)

## 2023-02-07 LAB — PROTIME-INR
INR: 2.8 — ABNORMAL HIGH (ref 0.8–1.2)
Prothrombin Time: 29.9 s — ABNORMAL HIGH (ref 11.4–15.2)

## 2023-02-07 LAB — BPAM PLATELET PHERESIS
Blood Product Expiration Date: 202501292359
ISSUE DATE / TIME: 202501291750
Unit Type and Rh: 7300

## 2023-02-07 LAB — CBC WITH DIFFERENTIAL/PLATELET
Abs Immature Granulocytes: 0.08 10*3/uL — ABNORMAL HIGH (ref 0.00–0.07)
Basophils Absolute: 0 10*3/uL (ref 0.0–0.1)
Basophils Relative: 0 %
Eosinophils Absolute: 0.1 10*3/uL (ref 0.0–0.5)
Eosinophils Relative: 1 %
HCT: 19.9 % — ABNORMAL LOW (ref 39.0–52.0)
Hemoglobin: 6.2 g/dL — CL (ref 13.0–17.0)
Immature Granulocytes: 1 %
Lymphocytes Relative: 17 %
Lymphs Abs: 2.1 10*3/uL (ref 0.7–4.0)
MCH: 29.7 pg (ref 26.0–34.0)
MCHC: 31.2 g/dL (ref 30.0–36.0)
MCV: 95.2 fL (ref 80.0–100.0)
Monocytes Absolute: 0.7 10*3/uL (ref 0.1–1.0)
Monocytes Relative: 6 %
Neutro Abs: 9.6 10*3/uL — ABNORMAL HIGH (ref 1.7–7.7)
Neutrophils Relative %: 75 %
Platelets: 23 10*3/uL — CL (ref 150–400)
RBC: 2.09 MIL/uL — ABNORMAL LOW (ref 4.22–5.81)
RDW: 17.4 % — ABNORMAL HIGH (ref 11.5–15.5)
WBC: 12.7 10*3/uL — ABNORMAL HIGH (ref 4.0–10.5)
nRBC: 0 % (ref 0.0–0.2)

## 2023-02-07 LAB — PREPARE PLATELET PHERESIS: Unit division: 0

## 2023-02-07 LAB — PHOSPHORUS: Phosphorus: 1.5 mg/dL — ABNORMAL LOW (ref 2.5–4.6)

## 2023-02-07 LAB — PREPARE RBC (CROSSMATCH)

## 2023-02-07 LAB — TECHNOLOGIST SMEAR REVIEW

## 2023-02-07 LAB — MAGNESIUM: Magnesium: 2.8 mg/dL — ABNORMAL HIGH (ref 1.7–2.4)

## 2023-02-07 SURGERY — CYSTOSCOPY, WITH RETROGRADE PYELOGRAM AND URETERAL STENT INSERTION
Anesthesia: General | Laterality: Right

## 2023-02-07 MED ORDER — VASOPRESSIN 20 UNIT/ML IV SOLN: 0.0000 [IU]/min | INTRAVENOUS | Status: DC

## 2023-02-07 MED ORDER — PROPOFOL 10 MG/ML IV BOLUS
INTRAVENOUS | Status: DC | PRN
  Administered 2023-02-07: 50 mg via INTRAVENOUS
  Administered 2023-02-07: 20 mg via INTRAVENOUS

## 2023-02-07 MED ORDER — ROCURONIUM BROMIDE 100 MG/10ML IV SOLN
INTRAVENOUS | Status: DC | PRN
  Administered 2023-02-07: 70 mg via INTRAVENOUS

## 2023-02-07 MED ORDER — PHENYLEPHRINE HCL-NACL 20-0.9 MG/250ML-% IV SOLN
INTRAVENOUS | Status: AC
  Filled 2023-02-07: qty 250

## 2023-02-07 MED ORDER — LIDOCAINE HCL 2 % IJ SOLN
INTRAMUSCULAR | Status: AC
  Filled 2023-02-07: qty 20

## 2023-02-07 MED ORDER — PHENYLEPHRINE HCL-NACL 20-0.9 MG/250ML-% IV SOLN
0.0000 ug/min | INTRAVENOUS | Status: DC
  Administered 2023-02-07: 45 ug/min via INTRAVENOUS

## 2023-02-07 MED ORDER — PHENYLEPHRINE 80 MCG/ML (10ML) SYRINGE FOR IV PUSH (FOR BLOOD PRESSURE SUPPORT)
PREFILLED_SYRINGE | INTRAVENOUS | Status: AC
  Filled 2023-02-07: qty 10

## 2023-02-07 MED ORDER — IOHEXOL 300 MG/ML  SOLN
INTRAMUSCULAR | Status: DC | PRN
  Administered 2023-02-07: 3 mL

## 2023-02-07 MED ORDER — VASOPRESSIN 20 UNIT/ML IV SOLN
INTRAVENOUS | Status: AC
  Filled 2023-02-07: qty 1

## 2023-02-07 MED ORDER — ONDANSETRON HCL 4 MG/2ML IJ SOLN
INTRAMUSCULAR | Status: DC | PRN
  Administered 2023-02-07: 4 mg via INTRAVENOUS

## 2023-02-07 MED ORDER — SODIUM CHLORIDE 0.9 % IR SOLN: 3000.0000 mL | Status: DC | PRN

## 2023-02-07 MED ORDER — PROPOFOL 10 MG/ML IV BOLUS
INTRAVENOUS | Status: AC
  Filled 2023-02-07: qty 20

## 2023-02-07 MED ORDER — DEXTROSE 5 % IV SOLN: INTRAVENOUS | Status: DC

## 2023-02-07 MED ORDER — ONDANSETRON HCL 4 MG/2ML IJ SOLN: 4.0000 mg | Freq: Once | INTRAMUSCULAR | Status: DC | PRN

## 2023-02-07 MED ORDER — LACTATED RINGERS IV SOLN: INTRAVENOUS | Status: DC | PRN

## 2023-02-07 MED ORDER — POTASSIUM PHOSPHATES 15 MMOLE/5ML IV SOLN
30.0000 mmol | Freq: Once | INTRAVENOUS | Status: AC
  Administered 2023-02-07: 30 mmol via INTRAVENOUS
  Filled 2023-02-07: qty 10

## 2023-02-07 MED ORDER — SODIUM CHLORIDE 0.9% IV SOLUTION: Freq: Once | INTRAVENOUS | Status: DC

## 2023-02-07 MED ORDER — INSULIN ASPART 100 UNIT/ML IJ SOLN
0.0000 [IU] | INTRAMUSCULAR | Status: DC
  Administered 2023-02-07: 5 [IU] via SUBCUTANEOUS
  Administered 2023-02-07: 3 [IU] via SUBCUTANEOUS
  Administered 2023-02-08: 1 [IU] via SUBCUTANEOUS
  Administered 2023-02-08 (×2): 2 [IU] via SUBCUTANEOUS
  Administered 2023-02-08: 3 [IU] via SUBCUTANEOUS
  Administered 2023-02-08 (×2): 2 [IU] via SUBCUTANEOUS
  Administered 2023-02-09: 1 [IU] via SUBCUTANEOUS
  Administered 2023-02-09: 2 [IU] via SUBCUTANEOUS
  Administered 2023-02-09: 1 [IU] via SUBCUTANEOUS
  Administered 2023-02-09: 2 [IU] via SUBCUTANEOUS
  Administered 2023-02-09: 3 [IU] via SUBCUTANEOUS
  Administered 2023-02-09: 2 [IU] via SUBCUTANEOUS
  Administered 2023-02-10 – 2023-02-11 (×8): 1 [IU] via SUBCUTANEOUS
  Administered 2023-02-11: 2 [IU] via SUBCUTANEOUS
  Administered 2023-02-11 (×2): 1 [IU] via SUBCUTANEOUS
  Administered 2023-02-11: 2 [IU] via SUBCUTANEOUS
  Administered 2023-02-12 (×2): 1 [IU] via SUBCUTANEOUS
  Administered 2023-02-13: 2 [IU] via SUBCUTANEOUS
  Administered 2023-02-14 – 2023-02-19 (×4): 1 [IU] via SUBCUTANEOUS

## 2023-02-07 MED ORDER — SODIUM CHLORIDE 0.9 % IV SOLN: 10.0000 mL/h | Freq: Once | INTRAVENOUS | Status: DC

## 2023-02-07 MED ORDER — LACTATED RINGERS IV SOLN: INTRAVENOUS | Status: DC

## 2023-02-07 MED ORDER — ACETAMINOPHEN 10 MG/ML IV SOLN
INTRAVENOUS | Status: AC
  Filled 2023-02-07: qty 100

## 2023-02-07 MED ORDER — SODIUM CHLORIDE 0.9 % IR SOLN
Status: DC | PRN
  Administered 2023-02-07: 3000 mL

## 2023-02-07 MED ORDER — FENTANYL CITRATE (PF) 100 MCG/2ML IJ SOLN
INTRAMUSCULAR | Status: DC | PRN
  Administered 2023-02-07 (×2): 25 ug via INTRAVENOUS

## 2023-02-07 MED ORDER — PHENYLEPHRINE HCL-NACL 20-0.9 MG/250ML-% IV SOLN
INTRAVENOUS | Status: DC | PRN
  Administered 2023-02-07: 50 ug/min via INTRAVENOUS

## 2023-02-07 MED ORDER — SUGAMMADEX SODIUM 200 MG/2ML IV SOLN
INTRAVENOUS | Status: DC | PRN
  Administered 2023-02-07: 200 mg via INTRAVENOUS

## 2023-02-07 MED ORDER — FENTANYL CITRATE (PF) 100 MCG/2ML IJ SOLN
INTRAMUSCULAR | Status: AC
  Filled 2023-02-07: qty 2

## 2023-02-07 MED ORDER — ONDANSETRON HCL 4 MG/2ML IJ SOLN
INTRAMUSCULAR | Status: AC
  Filled 2023-02-07: qty 2

## 2023-02-07 MED ORDER — LIDOCAINE HCL (CARDIAC) PF 100 MG/5ML IV SOSY
PREFILLED_SYRINGE | INTRAVENOUS | Status: DC | PRN
  Administered 2023-02-07: 50 mg via INTRAVENOUS

## 2023-02-07 MED ORDER — SODIUM CHLORIDE 0.45 % IV SOLN: INTRAVENOUS | Status: DC | PRN

## 2023-02-07 MED ORDER — ACETAMINOPHEN 10 MG/ML IV SOLN
INTRAVENOUS | Status: DC | PRN
  Administered 2023-02-07: 1000 mg via INTRAVENOUS

## 2023-02-07 MED ORDER — FENTANYL CITRATE PF 50 MCG/ML IJ SOSY: 25.0000 ug | PREFILLED_SYRINGE | INTRAMUSCULAR | Status: DC | PRN

## 2023-02-07 MED ORDER — VASOPRESSIN 20 UNIT/ML IV SOLN
INTRAVENOUS | Status: DC | PRN
  Administered 2023-02-07 (×2): 1 [IU] via INTRAVENOUS
  Administered 2023-02-07: 2 [IU] via INTRAVENOUS
  Administered 2023-02-07 (×2): 1 [IU] via INTRAVENOUS
  Administered 2023-02-07: 2 [IU] via INTRAVENOUS

## 2023-02-07 MED ORDER — BUDESONIDE 0.5 MG/2ML IN SUSP
0.5000 mg | Freq: Two times a day (BID) | RESPIRATORY_TRACT | Status: DC
  Administered 2023-02-07 – 2023-02-20 (×25): 0.5 mg via RESPIRATORY_TRACT
  Filled 2023-02-07 (×25): qty 2

## 2023-02-07 MED ORDER — VASOPRESSIN 20 UNITS/100 ML INFUSION FOR SHOCK
0.0000 [IU]/min | INTRAVENOUS | Status: DC
  Administered 2023-02-07: .03 [IU]/min via INTRAVENOUS
  Filled 2023-02-07: qty 100

## 2023-02-07 MED ORDER — PHENYLEPHRINE HCL-NACL 20-0.9 MG/250ML-% IV SOLN: 0.0000 ug/min | INTRAVENOUS | Status: DC

## 2023-02-07 MED ORDER — PHENYLEPHRINE HCL (PRESSORS) 10 MG/ML IV SOLN
INTRAVENOUS | Status: DC | PRN
  Administered 2023-02-07 (×4): 160 ug via INTRAVENOUS

## 2023-02-07 MED ORDER — CHLORHEXIDINE GLUCONATE 0.12 % MT SOLN
15.0000 mL | Freq: Once | OROMUCOSAL | Status: AC
  Administered 2023-02-07: 15 mL via OROMUCOSAL

## 2023-02-07 SURGICAL SUPPLY — 20 items
BAG URO CATCHER STRL LF (MISCELLANEOUS) ×1 IMPLANT
BASKET ZERO TIP NITINOL 2.4FR (BASKET) IMPLANT
CATH FOLEY 2WAY SLVR 5CC 18FR (CATHETERS) IMPLANT
CATH URETL OPEN 5X70 (CATHETERS) ×1 IMPLANT
CLOTH BEACON ORANGE TIMEOUT ST (SAFETY) ×1 IMPLANT
EXTRACTOR STONE 1.7FRX115CM (UROLOGICAL SUPPLIES) IMPLANT
GLOVE SURG LX STRL 7.5 STRW (GLOVE) ×1 IMPLANT
GOWN STRL REUS W/ TWL XL LVL3 (GOWN DISPOSABLE) ×1 IMPLANT
GUIDEWIRE ANG ZIPWIRE 038X150 (WIRE) IMPLANT
GUIDEWIRE STR DUAL SENSOR (WIRE) ×1 IMPLANT
KIT TURNOVER KIT A (KITS) IMPLANT
LASER FIB FLEXIVA PULSE ID 365 (Laser) IMPLANT
MANIFOLD NEPTUNE II (INSTRUMENTS) ×1 IMPLANT
PACK CYSTO (CUSTOM PROCEDURE TRAY) ×1 IMPLANT
SHEATH NAVIGATOR HD 12/14X28 (SHEATH) IMPLANT
SHEATH NAVIGATOR HD 12/14X36 (SHEATH) IMPLANT
STENT URET 6FRX24 CONTOUR (STENTS) IMPLANT
TRACTIP FLEXIVA PULS ID 200XHI (Laser) IMPLANT
TUBING CONNECTING 10 (TUBING) ×1 IMPLANT
TUBING UROLOGY SET (TUBING) ×1 IMPLANT

## 2023-02-07 NOTE — Progress Notes (Signed)
NAME:  [name], MRN:  [id number], DOB:  1935-03-09, LOS: 4 ADMISSION DATE:  02/02/2023, CONSULTATION DATE:  02/04/23 REFERRING MD:  [name] CHIEF COMPLAINT:  Dyspnea   History of Present Illness:  [name]. is a 88 y.o. male who has an extensive PMH as below, significant for CAD, CVA, aortic valve replacement 2012, hypertension, pulmonary hypertension, atrial fibrillation and small bowel obstruction  . He presented to [facility] ED 1/25 with dyspnea that woke him from his sleep the night prior. He had also complained of LE edema over the preceding few weeks. On EMS arrival, he had sats of 85% on room air. He was placed on Tillar O2 and given Solumedrol and DuoNebs in route to ED.  In ED, CXR was suggestive of mild edema. He was admitted by [facility] and started on diuresis. He was seen by cardiology in consultation who agreed with diuresis for fluid overload and obtaining an echo. Echo was obtained and demonstrated EF 60-65%, mod LVH, RV pressure overload with RVSP 61, LA and RA severely dilated.  On evening of 1/26, he developed nausea and vomiting with probable aspiration event. There was concern for SBO and was seen by general surgery who recommended continuing NPO and continuing NGT to suction.  On 1/27, diuresis was held due to bump in renal function after IV lasix. He was only net negative since admit. He did not appear to have excess volume per cardiology notes and appeared improved from CHF standpoint. Dyspnea and hypoxia were felt to more likely be related to aspiration PNA.  General surgery had also seen and he had benign exam and was passing flatus. He unfortunately pulled out his NGT that day.  After pulling out NGT 1/27, he had episode of tachypnea and increased WOB along with spike in temp to 102.64F. Due to increased WOB, PCCM asked to see in consultation. He had been on 4L [place] prior to this episode. Blood cultures from admission also returned positive for Enterobacter  cloacae. He was on Cefepime but after discussion with TRH, escalated to Meropenem.  Family report concerns of pt aspirating for over a week prior to admit. Further GOC clarified with pt's son, [name]/ HPOA, changed to DNR/ DNI.   Pertinent  Medical History:  has Essential hypertension; Gout; History of small bowel obstruction; History of BPH; S/P AVR; Hypokalemia; Left hemiparesis (HCC); Dysphagia, post-stroke; S/P AVR (aortic valve replacement); Benign prostatic hyperplasia with lower urinary tract symptoms; History of ischemic right MCA stroke; Permanent atrial fibrillation (HCC); Hyperglycemia; Mitral valve regurgitation; Tricuspid regurgitation; s/p mitral valve repair; S/P tricuspid valve repair; S/P Maze operation for atrial fibrillation; Long term current use of anticoagulant; Anemia; Anemia due to chronic blood loss; Acquired dilation of ascending aorta and aortic root (HCC); Chronic congestive heart failure (HCC); Elevated PSA; History of adenomatous polyp of colon; History of anemia; Hypercoagulable state (HCC); Impairment of balance; Peripheral neuropathy; Personal history of other malignant neoplasm of skin; Prediabetes; Presence of prosthetic heart valve; Sleep disorder; Thrombocytopenia (HCC); Vitamin D deficiency; Hypertrophy of prostate with urinary obstruction and other lower urinary tract symptoms (LUTS); Closed left hip fracture, initial encounter (HCC); Malnutrition of moderate degree; S/P left total hip arthroplasty; CAD (coronary artery disease); Cerebrovascular accident (CVA) (HCC); SIRS (systemic inflammatory response syndrome) (HCC); Generalized weakness; Otitis externa; Valvular heart disease; Hypoalbuminemia due to protein-calorie malnutrition (HCC); Abnormal findings on diagnostic imaging of other specified body structures; S/P total knee arthroplasty, left; Symptomatic anemia; HLD (hyperlipidemia); Hematuria; Weakness; Urinary catheter  in place; COVID-19 virus infection; Other secondary  pulmonary hypertension (HCC); Preoperative cardiovascular examination; Gait abnormality; Bilateral foot-drop; S/P total knee arthroplasty, right; Acute respiratory failure with hypoxia (HCC); Elevated troponin; Volume overload; Aspiration pneumonia (HCC); DNR (do not resuscitate); Ureteral calculus, right; Nephrolithiasis; Sepsis due to Enterobacter [facility]); and Renal cyst on their problem list.  Significant Hospital Events: Including procedures, antibiotic start and stop dates in addition to other pertinent events   1/25 admit 1/27 pulled out NGT and had episode of respiratory distress along with spike in temp to 102.64F. PCCM consulted. 1/27 Blood cultures returned positive for Enterobacter cloacae. 1/27 worsening shock, started on NE with escalating doses, comfort meds placed 1/28 much improved, more awake and interactive, NE down to 9. CVP 6, coox 85. 1/28 discussion with pt and family regarding eating. Had failed SLP eval but pt adamant to eat. Discussed risks/benefits and agreed to let pt drink liquids with supervision, if does well then can move to regular diet 1/29 HFNC 4L, tolerating ensure, weaning NE.  Urology consulted for renal CT showing mild right hydronephrosis with 12mm right proximal ureteral stone, hemorrhage  Interim History / Subjective:  Increased vasopressor support post op, extubated on BiPAP  Objective:  Blood pressure (!) 100/59, pulse 82, temperature 99.4 F (37.4 C), temperature source Axillary, resp. rate (!) 29, height 6' (1.829 m), weight 73.8 kg, SpO2 99%. CVP:  [7 mmHg] 7 mmHg  FiO2 (%):  [21 %-100 %] 100 %   Intake/Output Summary (Last 24 hours) at 02/07/2023 1227 Last data filed at 02/07/2023 1200 Gross per 24 hour  Intake 1456.13 ml  Output --  Net 1456.13 ml   Filed Weights   02/05/23 0500 02/06/23 0500 02/07/23 0500  Weight: 75.1 kg 72.8 kg 73.8 kg    Examination: General:  chronically ill and thin/ cachetic appearing elderly male lying in bed on  BiPAP in NAD HEENT: pupils 3/r, full face mask Neuro: awakens to verbal, follows simple commands, oriented x 1, MAE generalized weakness CV: irir, afib rate controlled, +murmur, RUE PICC, left radial aline PULM:  tachypneic, non labored on BiPAP18/8, 100% with TVs 550's, RR 30, lungs coarse, diminished in bases, no wheeze GI: soft, bs hypo, foley- tea/ ?slight blood tinged with sediment, FMS Extremities: warm/dry, generalized trace edema Skin: ecchymosis to arms-some areas wrapped with guaze  AM labs> Na 156, K 4.2, bicarb 21, BUN/ sCr 95/ 2.64, 99/ 2.33, Mag 2.8, phos 1.5, WBC 10.8> 5.5, H/H 7.8/ 25.3> 8.5/ 26.7, plts 32>30  Labs/imaging personally reviewed:  Echo 1/26 > EF 60-65%, mod LVH, RV pressure overload with RVSP 61, LA and RA severely dilated  CT renal 1/27 >  1. A 12 mm proximal right ureteral stone with mild right hydronephrosis. 2. Additional nonobstructing right renal calculi. 3. Hemorrhage within the dominant 8.5 cm cyst in the inferior pole of the right kidney. Active bleed or underlying malignancy is not excluded but cannot be evaluated on this noncontrast CT. Urology consult and follow-up recommended to exclude a bleeding mass. 4. Small intramuscular bleed within the left iliopsoas. Correlation with anticoagulation status recommended. 5. Sigmoid diverticulosis. No bowel obstruction. Normal appendix. 6. Small bilateral pleural effusions with associated partial compressive atelectasis of the lung bases. 7.  Aortic Atherosclerosis  Assessment & Plan:   Acute hypoxic respiratory failure - multifactorial in setting aspiration PNA (?chronic), fluid overload/CHF exacerbation, atelectasis.  - Doubt much CHF/fluid in play at this point 1/29, CVP and co-ox both reassuring.  Likely chronic component of aspiration/ dysphagia,  with prior MBS 2018 showing proximal esophageal dysfunction - ABG reassuring> 7.49/ 32/ 330/ 24 on BiPAP.  Can continue to wean BiPAP as WOB/ oxygenation  as allows, goal to transition back to [facility] given aspiration risk  - cont cefepime as below - keep NPO for now while on BiPAP.  SLP following  - aspiration precautions - cont BD, pulm hygiene, HTS, CPT - PT/ OT - PPI    Enterobacter cloacae bacteremia - 2/2 UTI and right ureteral stone with mild hydronephrosis Septic shock - 2/2 above and aspiration PNA - was not a candidate for perc nephrostomy tube given elevated INR and thrombocytopenia.  S/p cystoscopy with right ureteral stent placement 1/30 P:  - appreciate Urology assistance and input - restart NE, wean off NEO and cont vaso, wean for MAP > 65 - check CVP - previously DNR/ DNI.  code status reversed for OR/ procedure and to stay full code till pt's son can arrive given increased vasopressor and respiratory support needs post op - cont cefepime - hgb remains stable, monitor 2/2 below - follow repeat [facility] 1/29 and post procedure UC - trend WBC/ fever curve    Left iliopsoas intramuscular bleed Right renal cyst with hemorrhage  - H/H remains stable, trend CBC, transfuse for Hgb < 7 - recheck H/H at 1700 - monitor coagulopathy, SCDs for now   Ileus and less likely SBO - had NGT but pt removed this 1/27, passing flatus - s/p 1 BM 1/29, unclear type/ how much - NPO for now, hypoBS, continue to monitor - cont bowel regimen when able to take PO, prn fleets enema - reglan x 3 days started 1/28   AKI  - worsened after IV diuresis, shock state, mild R hydronephrosis with right renal calculi Hypernatremia Hypophosphatemia  - see above, s/p stent placement, appreciate urology assistance - cont foley per urology - D5 at 138ml/ hr - repeat renal panel at 1700 - Trend BMP / urinary output/ strict I/Os - Replace electrolytes as indicated - Avoid nephrotoxic agents, ensure adequate renal perfusion   Afib Valvular disease s/p bioprosthetic AVR 2012, w/ MV and TV repair  Pulmonary HTN- mixed group II and III CAD, non-obstructive   HFpEF - HR remains controlled, tele monitoring - optimize electrolytes - cont to hold pta BB while on vasopressors - cont to hold pta coumadin, will need heparin bridge if INR < 2  - cardiology s/o 1/29 - goal is euvolemic    Anemia of chronic disease Acute on chronic Thrombocytopenia Coagulapathy on coumadin  - repeat CBC now, and trend - daily coags - transfuse for Hgb < 7 or significant bleeding - cont to hold coumadin.  INR 2.8   Protein calorie malnutrition - maximize diet when able   Hyperglycemia, likely stress response - add SSI prn, CBG q 4, goal 140-180   Goals of care. - changed to full code for procedure and son's request to continue full medical care till his arrival per OR staff.  Pending update 1/30.  No family currently at bedside.   Best practice (evaluated daily):  Code Status:  full  DVT prophylaxis: SCD's.  Diet: NPO while on BiPAP Activity: bedrest for now, PT Lines: PICC, maintain foley Disposition : ICU   CC time: 35 mins     Posey Boyer, MSN, AG-ACNP-BC [facility] 02/07/2023, 12:27 PM  See Amion for pager If no response to pager , please call [phone] until 7pm After 7:00 pm call Elink  [phone]     

## 2023-02-07 NOTE — Transfer of Care (Signed)
Immediate Anesthesia Transfer of Care Note  Patient: [name].  Procedure(s) Performed: CYSTOSCOPY WITH RETROGRADE PYELOGRAM/URETERAL STENT PLACEMENT (Right)  Patient Location: PACU  Anesthesia Type:General  Level of Consciousness: awake and alert   Airway & Oxygen Therapy: Patient Spontanous Breathing and Patient connected to face mask oxygen  Post-op Assessment: Report given to RN, Post -op Vital signs reviewed and stable, and Post -op Vital signs reviewed and unstable, Anesthesiologist notified  Post vital signs: Reviewed and stable  Last Vitals:  Vitals Value Taken Time  BP 105/60 02/07/23 0915  Temp    Pulse 80 02/07/23 0916  Resp 17 02/07/23 0916  SpO2 100 % 02/07/23 0916  Vitals shown include unfiled device data.  Last Pain:  Vitals:   02/07/23 0614  TempSrc: Oral  PainSc:          Complications: No notable events documented.

## 2023-02-07 NOTE — Op Note (Signed)
Preoperative diagnosis:  Septic right ureteral stone   Postoperative diagnosis:  same   Procedure:  Cystoscopy right ureteral stent placement right retrograde pyelography with interpretation   Surgeon: Crist Fat, MD  Anesthesia: General  Complications: None  Intraoperative findings:   #1: Fluoroscopy demonstrated radiopaque density consistent with the patient's stone in the right mid proximal ureter. #2: I advanced the open-ended catheter passed the stone into the renal pelvis and aspirated 25 cc of very dark turbid murky urine. #3: I then injected 3 cc of Omnipaque contrast into the collecting system to opacify renal pelvis.  There are no appreciable filling defects within the renal pelvis itself. #4: A 26 cm time 6 French double-J ureteral stent was placed in the right ureter with a nice curl noted in the upper pole of the right kidney as well as in the bladder.  EBL: Minimal  Specimens: None  Indication: [name]. is a 88 y.o. patient with septic right proximal ureteral stone. After reviewing the management options for treatment, he elected to proceed with the above surgical procedure(s). We have discussed the potential benefits and risks of the procedure, side effects of the proposed treatment, the likelihood of the patient achieving the goals of the procedure, and any potential problems that might occur during the procedure or recuperation. Informed consent has been obtained.  Description of procedure:  The patient was taken to the operating room and general anesthesia was induced.  The patient was placed in the dorsal lithotomy position, prepped and draped in the usual sterile fashion, and preoperative antibiotics were administered. A preoperative time-out was performed.   Cystourethroscopy was performed.  The patient's urethra was examined and was normal with a small TURP defect within the prostate.  The bladder was trabeculated, but ureteral orifice  ease were orthotopic.  The bladder was then systematically examined in its entirety. There was no evidence for any bladder tumors, stones, or other mucosal pathology.    I then advanced a guidewire up through the right ureteral orifice and into the right renal pelvis under fluoroscopic guidance.  I then advanced the open-ended catheter over the wire and into the renal pelvis removing the wire.  I aspirated 25 cc of turbid urine.  A urine culture was sent from the OR.  I then performed retrograde pyelogram by gently instilling 3 cc of Omnipaque contrast into the renal pelvis opacifying the collecting system.  There were no appreciable filling defects.  I then exchanged the open-ended catheter for the sensor wire.  The stent was positioned appropriately under fluoroscopic and cystoscopic guidance.  The wire was then removed with an adequate stent curl noted in the renal pelvis as well as in the bladder.  The bladder was then emptied and the procedure ended.  The patient appeared to tolerate the procedure well and without complications.  The patient was able to be awakened and transferred to the recovery unit in satisfactory condition.    Crist Fat, M.D.

## 2023-02-07 NOTE — Progress Notes (Signed)
This morning the patient developed some respiratory distress with difficulty finishing sentences and some mental status changes.  He required a nonrebreather facemask to improve his O2 sats.  In my interaction with him, he was unable to process or communicate with me.  I spoke to his son Lyn Hollingshead, who is his DPOA regarding this.  I explained to him that for him to get better it would require  a procedure to place a right ureteral stent in order to bypass the kidney stone and allow his infection to drain, and that he is unlikely to get better without source control of his infection.  However, at this point, given the decompensation, he is at high risk for requiring prolonged intubation following the procedure.  We discussed alternatives to the procedure which would be conservative management and require escalating medical therapy.  Given his high INR and his low platelet count he is not a candidate for percutaneous nephrostomy tube.  After going through all of this, the son has decided to proceed with the procedure and has rescinded the DO NOT INTUBATE aspect of the patient's DNR status.

## 2023-02-07 NOTE — Anesthesia Postprocedure Evaluation (Signed)
Anesthesia Post Note  Patient: [name].  Procedure(s) Performed: CYSTOSCOPY WITH RETROGRADE PYELOGRAM/URETERAL STENT PLACEMENT (Right)     Patient location during evaluation: PACU Anesthesia Type: General Level of consciousness: awake and alert, oriented and patient cooperative Pain management: pain level controlled Vital Signs Assessment: post-procedure vital signs reviewed and stable Respiratory status: spontaneous breathing, nonlabored ventilation and respiratory function stable Cardiovascular status: blood pressure returned to baseline and stable Postop Assessment: no apparent nausea or vomiting Anesthetic complications: no Comments: Severe urosepsis with extreme electrolyte disturbances- already has bed in ICU. Sepsis dosing of vasopressin as well as titratable phenylephrine gtt infusing.   No notable events documented.  Last Vitals:  Vitals:   02/07/23 0930 02/07/23 0945  BP: 113/69 115/67  Pulse: 80 79  Resp: 18 16  Temp:    SpO2: 100% 100%    Last Pain:  Vitals:   02/07/23 0945  TempSrc:   PainSc: 0-No pain                 Lannie Fields

## 2023-02-07 NOTE — Anesthesia Procedure Notes (Signed)
Procedure Name: Intubation Date/Time: 02/07/2023 9:19 AM  Performed by: Deri Fuelling, CRNAPre-anesthesia Checklist: Patient identified, Emergency Drugs available, Suction available and Patient being monitored Patient Re-evaluated:Patient Re-evaluated prior to induction Oxygen Delivery Method: Circle system utilized Preoxygenation: Pre-oxygenation with 100% oxygen Induction Type: IV induction Ventilation: Mask ventilation without difficulty Laryngoscope Size: 4 and Glidescope Grade View: Grade I Tube type: Oral Tube size: 7.5 mm Number of attempts: 1 Airway Equipment and Method: Stylet and Oral airway Placement Confirmation: ETT inserted through vocal cords under direct vision, positive ETCO2 and breath sounds checked- equal and bilateral Secured at: 22 cm Tube secured with: Tape Dental Injury: Teeth and Oropharynx as per pre-operative assessment

## 2023-02-07 NOTE — Progress Notes (Signed)
SLP Cancellation Note  Patient Details Name: [name]. MRN: [id number] DOB: 07-Apr-1935   Cancelled treatment:       Reason Eval/Treat Not Completed: Other (comment);Fatigue/lethargy limiting ability to participate (pt is s/p surgery this am, is currently lethargic and on Bipap)  [name], MS [facility] SLP Acute Rehab Services Office [phone]  [name] 02/07/2023, 1:44 PM

## 2023-02-07 NOTE — Anesthesia Preprocedure Evaluation (Addendum)
Anesthesia Evaluation  Patient identified by MRN, date of birth, ID band Patient awake    Reviewed: Allergy & Precautions, H&P , NPO status , Patient's Chart, lab work & pertinent test results  Airway Mallampati: III  TM Distance: >3 FB Neck ROM: Full   Comment: Very limited cooperation w/ airway exam d/t poor mental status  Dental  (+) Dental Advisory Given, Poor Dentition   Pulmonary  admitted to the hospital on 02/01/2022 with shortness of breath.  During his hospital course he developed a fever to 102.6.  Blood and urine cultures positive for Enterobacter.  He was evaluated with a CT scan today which showed a 12 mm calculus in the right proximal ureter with obstruction, nonobstructing right renal calculi, renal cyst including a possible hemorrhagic cyst in the lower pole of the right kidney.  He was initially on pressors for hypotension.   Weaned from HFNC and off oxygen yesterday, but this AM in preop his respirations are very fast and labored- pt appears very uncomfortable. Per son, this is an acute change.   RR in the 30s, respirations very labored   + decreased breath sounds unstable     Cardiovascular hypertension, pulmonary hypertension (severe pHTN)+ CAD (nonobstructive on cath 2019)  + dysrhythmias (s/p MAZE) Atrial Fibrillation + Valvular Problems/Murmurs (mod MS and mild AS and mild TR s/p MVR/AVR/TVR)  Rhythm:Regular Rate:Tachycardia  Echo yesterday:  1. S/P MV repair with mean gradient 8 mmHg and no MR; s/p AVR with mean  gradient 14 mmHg and no AI; s/p TV repair with mild TR.   2. Left ventricular ejection fraction, by estimation, is 60 to 65%. The  left ventricle has normal function. The left ventricle has no regional  wall motion abnormalities. There is moderate left ventricular hypertrophy.  Left ventricular diastolic  parameters are indeterminate. There is the interventricular septum is  flattened in systole and  diastole, consistent with right ventricular  pressure and volume overload.   3. Right ventricular systolic function is normal. The right ventricular  size is mildly enlarged. There is severely elevated pulmonary artery  systolic pressure.   4. Left atrial size was severely dilated.   5. Right atrial size was severely dilated.   6. The mitral valve has been repaired/replaced. No evidence of mitral  valve regurgitation. No evidence of mitral stenosis. There is a prosthetic  annuloplasty ring present in the mitral position.   7. The tricuspid valve is has been repaired/replaced. The tricuspid valve  is status post repair with an annuloplasty ring.   8. The aortic valve has been repaired/replaced. Aortic valve  regurgitation is not visualized. No aortic stenosis is present. There is a  bioprosthetic valve present in the aortic position.   9. The inferior vena cava is dilated in size with <50% respiratory  variability, suggesting right atrial pressure of 15 mmHg.    Stress test 2018  Nuclear stress EF: 60%.  Blood pressure demonstrated a normal response to exercise.  Horizontal ST segment depression ST segment depression of 2 mm was noted during stress in the II, III, aVF, V6, V5 and V4 leads.  This is a low risk study.  ST segment depression of 2 mm was noted during stress in the II, III, aVF, V6, V5 and V4 leads.  The test was stopped because the patient complained of fatigue and shortness of breath  Overall, the patient's exercise capacity was moderately impaired  D[employer] Treadmill Score: intermediate risk   Hypotensive in preop 8-90SBP  Neuro/Psych        Very altered mental status upon picking patient up for surgery this AM per nurses, minimal responsiveness in preop. CVA    GI/Hepatic negative GI ROS, Neg liver ROS,,,  Endo/Other  diabetes (prediabetic)    Renal/GU CRFRenal diseaseCr 2.33  negative genitourinary   Musculoskeletal  (+) Arthritis , Osteoarthritis,     Abdominal   Peds negative pediatric ROS (+)  Hematology  (+) Blood dyscrasia, anemia Hb 8.5 plt 30   Anesthesia Other Findings   Reproductive/Obstetrics negative OB ROS                             Anesthesia Physical Anesthesia Plan  ASA: 5  Anesthesia Plan: General   Post-op Pain Management: Ofirmev IV (intra-op)*   Induction: Intravenous  PONV Risk Score and Plan: Treatment may vary due to age or medical condition and Ondansetron  Airway Management Planned: Oral ETT  Additional Equipment: Arterial line  Intra-op Plan:   Post-operative Plan: Extubation in OR  Informed Consent: I have reviewed the patients History and Physical, chart, labs and discussed the procedure including the risks, benefits and alternatives for the proposed anesthesia with the patient or authorized representative who has indicated his/her understanding and acceptance.     Dental advisory given  Plan Discussed with: CRNA  Anesthesia Plan Comments: (Hypotensive in preop 80-90 SBP on NIBP, febrile- appears to be an acute change overnight/this morning. Was previously on pressors and HFNC but pressors as well as oxygen were able to be weaned yesterday. Spoke w/ pt son over telephone who stated that his labored respirations is an acute change from yesterday. His INR has continued to climb and platelets continue to drop to 30 this AM. D/W Dr. Marlou Porch and pt son/POA that his urosepsis is severe at this point and outcomes may be poor with or without surgical intervention, but control of the source of infection will give him a better chance of recovery. Pt DNR previously, son wishes to rescind for OR as well as until he arrives to hospital this afternoon- this was communicated to this ICU for postop care. D/w the possibility of cardiac arrest, prolonged postoperative intubation, death w/ POA.   2 units of blood and 2 units of platelets ordered. Will need pre-induction arterial line  and likely additional peripheral vs central access for pressors. Current access is PICC RUE.)        Anesthesia Quick Evaluation

## 2023-02-07 NOTE — Progress Notes (Signed)
Pt moved from PACU to ICU 1233 on bipap.  Pt tolerated well.

## 2023-02-07 NOTE — IPAL (Signed)
  Interdisciplinary Goals of Care Family Meeting   Date carried out: 02/07/2023  Location of the meeting: Bedside  Member's involved: Nurse Practitioner, Bedside Registered Nurse, and Family Member or next of kin  Durable Power of Attorney or acting medical decision maker: son, Tip Atienza    Discussion: We discussed goals of care for [employer]. .  Son requesting code status be changed back to DNR/ DNI.  Would be ok with vasopressor support support  Code status:   Code Status: Limited: Do not attempt resuscitation (DNR) -DNR-LIMITED -Do Not Intubate/DNI    Disposition: Continue current acute care  Time spent for the meeting: 10 mins       Posey Boyer, MSN, AG-ACNP-BC [facility] 02/07/2023, 6:49 PM  See Amion for pager If no response to pager , please call [phone] until 7pm After 7:00 pm call Elink  [phone]

## 2023-02-07 NOTE — Consult Note (Signed)
[facility] Liaison Consult Note   02/07/2023  [name]. [birth year] [id number]  Insurance: [place] [employer] Medicare   Primary Care Provider: Jarrett Soho, Cordelia Poche, with Deboraha Sprang at [facility], this provider is listed for the transition of care follow up appointments  and with [facility] Transition team calls   [facility] Liaison screened the patient remotely at [facility]. Patient noted at ICU level of care at time of this brief review    The patient was screened for 5-day hospitalization with noted high risk score for unplanned readmission risk 2 hospital admissions in 6 months.  The patient was assessed for potential Community Care Coordination service needs for post hospital transition for care coordination. Admitted with Acute respiratory Distress.  Plan: [facility] Liaison will continue to follow progress and disposition to asess for post hospital community care coordination/management needs.  Referral request for community care coordination: Will follow as disposition and for progress in  level of care   [facility]BC[facility] Community Care, Population Health does not replace or interfere with any arrangements made by the Inpatient Transition of Care team.   For questions contact:   Charlesetta Shanks, RN, BSN, CCM Jane  [facility], [facility] Liaison Direct Dial: [phone] or secure chat Email: [email or web address]

## 2023-02-07 NOTE — Progress Notes (Signed)
Pt seen and given his scheduled nebulizer treatment which he tolerated well.  No increased wob / respiratory distress noted at this time.  HR68, RR19, spo2 98% on 2l [place].  Bipap remains in room on standby but not indicated at this time.

## 2023-02-07 NOTE — Anesthesia Procedure Notes (Signed)
Arterial Line Insertion Start/End1/30/2025 7:56 AM, 02/07/2023 8:01 AM Performed by: Lannie Fields, DO, anesthesiologist  Patient location: Pre-op. Preanesthetic checklist: patient identified, IV checked, site marked, risks and benefits discussed, surgical consent, monitors and equipment checked, pre-op evaluation, timeout performed and anesthesia consent Lidocaine 1% used for infiltration Left, radial was placed Catheter size: 20 G Hand hygiene performed  and maximum sterile barriers used   Attempts: 1 Procedure performed without using ultrasound guided technique. Following insertion, dressing applied. Post procedure assessment: normal and unchanged  Patient tolerated the procedure well with no immediate complications.

## 2023-02-07 NOTE — Progress Notes (Addendum)
Speech Language Pathology Treatment: Dysphagia  Patient Details Name: [name]. MRN: [id number] DOB: [birth year] [name] Date: 02/07/2023 Time: 4098-1191 SLP Time Calculation (min) (ACUTE ONLY): 26 min  Assessment / Plan / Recommendation Clinical Impression  Today pt without gagging during po intake! He is alert and wishes to consume po intake. He continues with throat clearing and cough after approx 80% of thin water trials - no coughing or throat clearing with Ensure nor solid. Prolonged mastication with solids with pt attempting to use water to orally transit - however - advised he use icecream instead to decrease risk of aspirating. With mod I cues, pt able to utilize strategy. He was agreeable to plan with excellent tolerance/oral clearance. No gagging observed today with all of his icecream nor 2 boluses of solids (as observed with applesauce yesterday). He now states applesauce can cause him "problems" with gagging - stating occurence PTA - but then denying later. Suspect pt's surgery today with intubation and Bipap usage maybe has contributed to some confusion. In discussion with pt regarding options - diet advancement or continue full liquids - he wished to continue full liquid diet. States his swallow function is near his baseline - Ideally will plan for MBS next am *pt had large bowel movement prior date* given his CVA hx, premorbid dysphagia and respiratory issues. Pt in agreement to plan to MBS to aid in determining appropriate exercises/compensations to mitigate dysphagia/aspiration.       HPI HPI: 88yo male admitted 02/02/23 with SOB 2/2 CHF exacerbation with possible aspiration event. PMH: acquired dilation of the ascending aorta and aortic root, CAD, AFib, Right basal ganglia CVA (2018), HTN, heart murmur, AVR, OA, BPH, SBO, neuropathy, thrombocytopenia.  PMH+ for dysphagia from CVA 2018 requiring thickener and another CVA that did not impair his swallowing. Pt developed some  respiratory difficulties this am - and was placed on 4 L HFNC - s/p surgery with urology stent and then sent from surgery to ICU on vasopressors and Bipap 1/30. Subsequently taken off Bipap and awoke adequately for po.      SLP Plan  Continue with current plan of care;MBS (MBS 02/08/2023)      Recommendations for follow up therapy are one component of a multi-disciplinary discharge planning process, led by the attending physician.  Recommendations may be updated based on patient status, additional functional criteria and insurance authorization.    Recommendations  Diet recommendations: Thin liquid;Nectar-thick liquid Liquids provided via: Cup;Straw Medication Administration: Other (Comment) (with Ensure) Supervision: Patient able to self feed Compensations: Slow rate;Small sips/bites Postural Changes and/or Swallow Maneuvers: Seated upright 90 degrees;Upright 30-60 min after meal                  Oral care BID   Other (comment) (TBD) Dysphagia, unspecified (R13.10)     Continue with current plan of care;MBS (MBS 02/08/2023)    [name], MS [facility] SLP Acute Rehab Services Office [phone]  [name]  02/07/2023, 5:16 PM

## 2023-02-08 ENCOUNTER — Other Ambulatory Visit: Payer: Self-pay

## 2023-02-08 ENCOUNTER — Other Ambulatory Visit: Payer: Medicare Other

## 2023-02-08 ENCOUNTER — Encounter: Payer: Self-pay | Admitting: *Deleted

## 2023-02-08 ENCOUNTER — Ambulatory Visit: Payer: Medicare Other | Admitting: Oncology

## 2023-02-08 ENCOUNTER — Encounter (HOSPITAL_COMMUNITY): Payer: Self-pay | Admitting: Urology

## 2023-02-08 ENCOUNTER — Other Ambulatory Visit: Payer: Self-pay | Admitting: *Deleted

## 2023-02-08 DIAGNOSIS — J9601 Acute respiratory failure with hypoxia: Secondary | ICD-10-CM | POA: Diagnosis not present

## 2023-02-08 DIAGNOSIS — R6521 Severe sepsis with septic shock: Secondary | ICD-10-CM | POA: Diagnosis not present

## 2023-02-08 DIAGNOSIS — D649 Anemia, unspecified: Secondary | ICD-10-CM

## 2023-02-08 DIAGNOSIS — E46 Unspecified protein-calorie malnutrition: Secondary | ICD-10-CM

## 2023-02-08 DIAGNOSIS — N132 Hydronephrosis with renal and ureteral calculous obstruction: Secondary | ICD-10-CM | POA: Diagnosis not present

## 2023-02-08 DIAGNOSIS — A419 Sepsis, unspecified organism: Secondary | ICD-10-CM | POA: Diagnosis not present

## 2023-02-08 DIAGNOSIS — R739 Hyperglycemia, unspecified: Secondary | ICD-10-CM

## 2023-02-08 LAB — PREPARE RBC (CROSSMATCH)

## 2023-02-08 LAB — COMPREHENSIVE METABOLIC PANEL
ALT: 33 U/L (ref 0–44)
AST: 48 U/L — ABNORMAL HIGH (ref 15–41)
Albumin: 2.1 g/dL — ABNORMAL LOW (ref 3.5–5.0)
Alkaline Phosphatase: 106 U/L (ref 38–126)
Anion gap: 9 (ref 5–15)
BUN: 80 mg/dL — ABNORMAL HIGH (ref 8–23)
CO2: 23 mmol/L (ref 22–32)
Calcium: 7.7 mg/dL — ABNORMAL LOW (ref 8.9–10.3)
Chloride: 113 mmol/L — ABNORMAL HIGH (ref 98–111)
Creatinine, Ser: 1.83 mg/dL — ABNORMAL HIGH (ref 0.61–1.24)
GFR, Estimated: 36 mL/min — ABNORMAL LOW (ref >60)
Glucose, Bld: 484 mg/dL — ABNORMAL HIGH (ref 70–99)
Potassium: 3.6 mmol/L (ref 3.5–5.1)
Sodium: 145 mmol/L (ref 135–145)
Total Bilirubin: 1.2 mg/dL (ref 0.0–1.2)
Total Protein: 5.2 g/dL — ABNORMAL LOW (ref 6.5–8.1)

## 2023-02-08 LAB — GLUCOSE, CAPILLARY
Glucose-Capillary: 132 mg/dL — ABNORMAL HIGH (ref 70–99)
Glucose-Capillary: 133 mg/dL — ABNORMAL HIGH (ref 70–99)
Glucose-Capillary: 145 mg/dL — ABNORMAL HIGH (ref 70–99)
Glucose-Capillary: 190 mg/dL — ABNORMAL HIGH (ref 70–99)
Glucose-Capillary: 198 mg/dL — ABNORMAL HIGH (ref 70–99)
Glucose-Capillary: 217 mg/dL — ABNORMAL HIGH (ref 70–99)

## 2023-02-08 LAB — CBC
HCT: 20.3 % — ABNORMAL LOW (ref 39.0–52.0)
HCT: 23.2 % — ABNORMAL LOW (ref 39.0–52.0)
Hemoglobin: 6.2 g/dL — CL (ref 13.0–17.0)
Hemoglobin: 7.3 g/dL — ABNORMAL LOW (ref 13.0–17.0)
MCH: 29.2 pg (ref 26.0–34.0)
MCH: 29.9 pg (ref 26.0–34.0)
MCHC: 30.5 g/dL (ref 30.0–36.0)
MCHC: 31.5 g/dL (ref 30.0–36.0)
MCV: 95.8 fL (ref 80.0–100.0)
MCV: 95.1 fL (ref 80.0–100.0)
Platelets: 23 10*3/uL — CL (ref 150–400)
Platelets: 41 10*3/uL — ABNORMAL LOW (ref 150–400)
RBC: 2.12 MIL/uL — ABNORMAL LOW (ref 4.22–5.81)
RBC: 2.44 MIL/uL — ABNORMAL LOW (ref 4.22–5.81)
RDW: 17.6 % — ABNORMAL HIGH (ref 11.5–15.5)
RDW: 17.4 % — ABNORMAL HIGH (ref 11.5–15.5)
WBC: 12.3 10*3/uL — ABNORMAL HIGH (ref 4.0–10.5)
WBC: 11.2 10*3/uL — ABNORMAL HIGH (ref 4.0–10.5)
nRBC: 0 % (ref 0.0–0.2)
nRBC: 0 % (ref 0.0–0.2)

## 2023-02-08 LAB — URINE CULTURE: Culture: NO GROWTH

## 2023-02-08 LAB — PROTIME-INR
INR: 2.8 — ABNORMAL HIGH (ref 0.8–1.2)
INR: 2.2 — ABNORMAL HIGH (ref 0.8–1.2)
Prothrombin Time: 29.9 s — ABNORMAL HIGH (ref 11.4–15.2)
Prothrombin Time: 25 s — ABNORMAL HIGH (ref 11.4–15.2)

## 2023-02-08 LAB — PHOSPHORUS: Phosphorus: 3.1 mg/dL (ref 2.5–4.6)

## 2023-02-08 LAB — MAGNESIUM: Magnesium: 2.3 mg/dL (ref 1.7–2.4)

## 2023-02-08 LAB — FIBRINOGEN: Fibrinogen: 731 mg/dL — ABNORMAL HIGH (ref 210–475)

## 2023-02-08 MED ORDER — SODIUM CHLORIDE 0.9% IV SOLUTION: Freq: Once | INTRAVENOUS | Status: AC

## 2023-02-08 MED ORDER — SODIUM CHLORIDE 0.9 % IV SOLN
2.0000 g | Freq: Two times a day (BID) | INTRAVENOUS | Status: DC
  Administered 2023-02-08 – 2023-02-09 (×3): 2 g via INTRAVENOUS
  Filled 2023-02-08 (×3): qty 12.5

## 2023-02-08 MED ORDER — ADULT MULTIVITAMIN W/MINERALS CH
1.0000 | ORAL_TABLET | Freq: Every day | ORAL | Status: DC
  Administered 2023-02-12 – 2023-02-22 (×11): 1 via ORAL
  Filled 2023-02-08 (×12): qty 1

## 2023-02-08 NOTE — Progress Notes (Signed)
When bathing patient, RN noted large hematoma on patient's LUE below blood pressure cuff. Boarders marked and measured at 20 x25 cm. Dr Marchelle Gearing notified by this RN. Extremity elevated. Patient is currently receiving multiple blood products for low platelets and hgb. RN will continue to carefully monitor site.

## 2023-02-08 NOTE — Progress Notes (Signed)
Speech Language Pathology Treatment: Dysphagia  Patient Details Name: [name]. MRN: [id number] DOB: 05-09-1935 [name] Date: 02/08/2023 Time: 1308-6578 SLP Time Calculation (min) (ACUTE ONLY): 23 min  Assessment / Plan / Recommendation Clinical Impression  Pt seen for dysphagia management as pt unable to have MBS today given he is getting blood. Pt's caregiver, Iris, and his wife present. An interview conducted revealed that pt has been having dysphagia symptoms for the last several weeks - gradual onset. Dysphagia symptoms included coughing with solids and liquids, denies any other medical issues occuring at approximately that time. They endorse he has been gagging some as well. SLP observed consuming magic cup, ensure, gingerale and water. He demonstrated overt coughing when drinking water via straw - much improved via cup bolus. No clinical indication of aspiration with Ensure, magic cup nor small boluses of Gingerale. SLP wanted pt to have solids to determine potential readiness for solid intake advancement but he politely declined as she stated he was concerned he may gag. He expressed desire to continue his full liquid diet pending MBS. Messaged dietician re: him being pleased with Magic Cup *pecan flavored. Using teach back and demonstration educated pt's caregiver, Iris, to assure observe pt swallowing prior to giving more po intake. She reported understanding to precauations. Will plan MBS as soon as able. Pt denies any gagging with liquids.   Of note, pt's oral cavity is appearing much better!  He admits to some "discomfort" but does not isolate location.      Today     02/08/2023                                                02/07/2023   HPI HPI: 88yo male admitted 02/02/23 with SOB 2/2 CHF exacerbation with possible aspiration event. PMH: acquired dilation of the ascending aorta and aortic root, CAD, AFib, Right basal ganglia CVA (2018), HTN, heart murmur, AVR, OA, BPH, SBO,  neuropathy, thrombocytopenia.  PMH+ for dysphagia from CVA 2018 requiring thickener and another CVA that did not impair his swallowing. Pt developed some respiratory difficulties this am - and was placed on 4 L HFNC - s/p surgery with urology stent and then sent from surgery to ICU on vasopressors and Bipap 1/30. Subsequently taken off Bipap and awoke adequately for po.      SLP Plan  Continue with current plan of care;MBS (MBS 02/08/2023)      Recommendations for follow up therapy are one component of a multi-disciplinary discharge planning process, led by the attending physician.  Recommendations may be updated based on patient status, additional functional criteria and insurance authorization.    Recommendations  Diet recommendations: Thin liquid (full liquids) Liquids provided via: Cup;Straw Medication Administration: Other (Comment) (with Ensure) Supervision: Patient able to self feed Compensations: Slow rate;Small sips/bites Postural Changes and/or Swallow Maneuvers: Seated upright 90 degrees;Upright 30-60 min after meal                  Oral care BID   Other (comment) (TBD) Dysphagia, unspecified (R13.10)     Continue with current plan of care;MBS (MBS 02/08/2023)     [name]  02/08/2023, 5:50 PM

## 2023-02-08 NOTE — Progress Notes (Signed)
RN released second unit of platelets that was ordered for patient and took the necessary release form to blood bank. Blood bank notified RN that there was not an additional unit of platelets ordered in their system. RN started ordered unit of PRBCs instead, and completed the unavailable unit of platelets on the blood admin flowsheet. CCM notified, and verified that only 1 unit of platelets was needed at this time.

## 2023-02-08 NOTE — Evaluation (Signed)
SLP Cancellation Note  Patient Details Name: [name]. MRN: [id number] DOB: 1935-07-16   Cancelled treatment:       Reason Eval/Treat Not Completed: Other (comment) (pt unable to have mbs today as he is continuing to get blood products; will continue efforts- hopefully mbs can be completed next date.)  [name], MS [facility] SLP Acute Rehab Services Office [phone]   [name] 02/08/2023, 1:56 PM

## 2023-02-08 NOTE — Progress Notes (Signed)
Pharmacy Antibiotic Note  [name]. is a 88 y.o. male admitted on 02/02/2023 with  Enterobacter cloacae bacteremia .  Antibiotics being narrowed from meropenem to cefepime based on sensitivities. Pharmacy consulted to dose cefepime.   Today, 02/08/23 WBC elevated and increased, last lab 1/28 AKI improving; today's est CrCl 32 ml/min Afebrile this morning, remains on pressors  Plan: Increase cefepime to 2 g IV every 12 hours Continue to follow renal function for dose adjustments  Height: 6' (182.9 cm) Weight: 79.6 kg (175 lb 7.8 oz) IBW/kg (Calculated) : 77.6  Temp (24hrs), Avg:98.1 F (36.7 C), Min:97.7 F (36.5 C), Max:99.4 F (37.4 C)  Recent Labs  Lab 02/03/23 1955 02/03/23 2348 02/04/23 0814 02/05/23 0255 02/06/23 0700 02/06/23 2216 02/07/23 0527 02/07/23 1455 02/07/23 1717 02/07/23 2141 02/08/23 0620  WBC  --  23.1*   < > 34.7* 16.9* 10.8* 5.5 20.8*  --  12.7* 12.3*  CREATININE  --  2.51*   < > 2.77* 2.64*  --  2.33*  --  2.33*  --  1.83*  LATICACIDVEN 3.1* 3.2*  --   --   --   --   --   --   --   --   --    < > = values in this interval not displayed.    Estimated Creatinine Clearance: 32.4 mL/min (A) (by C-G formula based on SCr of 1.83 mg/dL (H)).    Allergies  Allergen Reactions   Sulfa Antibiotics Other (See Comments)    Granulocytosis   Sulfamethoxazole-Trimethoprim     Other Reaction(s): Unknown   Zestril [Lisinopril] Cough    Microbiology results: 1/26 BCx: Enterobacter cloacae complex (pan-S) 1/26 UCx: >100K Enterobacter species (pan-S)  Lynden Ang, PharmD, BCPS 02/08/2023 10:02 AM

## 2023-02-08 NOTE — Progress Notes (Addendum)
NAME:  Volney Reierson., MRN:  [id number], DOB:  1935-02-03, LOS: 5 ADMISSION DATE:  02/02/2023, CONSULTATION DATE:  02/04/23 REFERRING MD:  Sunnie Nielsen CHIEF COMPLAINT:  Dyspnea   History of Present Illness:  Brason Berthelot. is a 88 y.o. male who has an extensive PMH as below, significant for CAD, CVA, aortic valve replacement 2012, hypertension, pulmonary hypertension, atrial fibrillation and small bowel obstruction  . He presented to [facility] ED 1/25 with dyspnea that woke him from his sleep the night prior. He had also complained of LE edema over the preceding few weeks. On EMS arrival, he had sats of 85% on room air. He was placed on [place] O2 and given Solumedrol and DuoNebs in route to ED.  In ED, CXR was suggestive of mild edema. He was admitted by [facility] and started on diuresis. He was seen by cardiology in consultation who agreed with diuresis for fluid overload and obtaining an echo. Echo was obtained and demonstrated EF 60-65%, mod LVH, RV pressure overload with RVSP 61, LA and RA severely dilated.  On evening of 1/26, he developed nausea and vomiting with probable aspiration event. There was concern for SBO and was seen by general surgery who recommended continuing NPO and continuing NGT to suction.  On 1/27, diuresis was held due to bump in renal function after IV lasix. He was only net negative since admit. He did not appear to have excess volume per cardiology notes and appeared improved from CHF standpoint. Dyspnea and hypoxia were felt to more likely be related to aspiration PNA.  General surgery had also seen and he had benign exam and was passing flatus. He unfortunately pulled out his NGT that day.  After pulling out NGT 1/27, he had episode of tachypnea and increased WOB along with spike in temp to 102.19F. Due to increased WOB, PCCM asked to see in consultation. He had been on 4L [place] prior to this episode. Blood cultures from admission also returned positive for Enterobacter  cloacae. He was on Cefepime but after discussion with TRH, escalated to Meropenem.  Family report concerns of pt aspirating for over a week prior to admit. Further GOC clarified with pt's son, [name], changed to DNR/ DNI.   Pertinent  Medical History:  has Essential hypertension; Gout; History of small bowel obstruction; History of BPH; S/P AVR; Hypokalemia; Left hemiparesis (HCC); Dysphagia, post-stroke; S/P AVR (aortic valve replacement); Benign prostatic hyperplasia with lower urinary tract symptoms; History of ischemic right MCA stroke; Permanent atrial fibrillation (HCC); Hyperglycemia; Mitral valve regurgitation; Tricuspid regurgitation; s/p mitral valve repair; S/P tricuspid valve repair; S/P Maze operation for atrial fibrillation; Long term current use of anticoagulant; Anemia; Anemia due to chronic blood loss; Acquired dilation of ascending aorta and aortic root (HCC); Chronic congestive heart failure (HCC); Elevated PSA; History of adenomatous polyp of colon; History of anemia; Hypercoagulable state (HCC); Impairment of balance; Peripheral neuropathy; Personal history of other malignant neoplasm of skin; Prediabetes; Presence of prosthetic heart valve; Sleep disorder; Thrombocytopenia (HCC); Vitamin D deficiency; Hypertrophy of prostate with urinary obstruction and other lower urinary tract symptoms (LUTS); Closed left hip fracture, initial encounter (HCC); Malnutrition of moderate degree; S/P left total hip arthroplasty; CAD (coronary artery disease); Cerebrovascular accident (CVA) (HCC); SIRS (systemic inflammatory response syndrome) (HCC); Generalized weakness; Otitis externa; Valvular heart disease; Hypoalbuminemia due to protein-calorie malnutrition (HCC); Abnormal findings on diagnostic imaging of other specified body structures; S/P total knee arthroplasty, left; Symptomatic anemia; HLD (hyperlipidemia); Hematuria; Weakness; Urinary catheter  in place; COVID-19 virus infection; Other secondary  pulmonary hypertension (HCC); Preoperative cardiovascular examination; Gait abnormality; Bilateral foot-drop; S/P total knee arthroplasty, right; Acute respiratory failure with hypoxia (HCC); Elevated troponin; Volume overload; Aspiration pneumonia (HCC); DNR (do not resuscitate); Ureteral calculus, right; Nephrolithiasis; Sepsis due to Enterobacter [facility]); and Renal cyst on their problem list.  Significant Hospital Events: Including procedures, antibiotic start and stop dates in addition to other pertinent events   1/25 admit 1/27 pulled out NGT and had episode of respiratory distress along with spike in temp to 102.48F. PCCM consulted. 1/27 Blood cultures returned positive for Enterobacter cloacae. 1/27 worsening shock, started on NE with escalating doses, comfort meds placed 1/28 much improved, more awake and interactive, NE down to 9. CVP 6, coox 85. 1/28 discussion with pt and family regarding eating. Had failed SLP eval but pt adamant to eat. Discussed risks/benefits and agreed to let pt drink liquids with supervision, if does well then can move to regular diet 1/29 HFNC 4L, tolerating ensure, weaning NE.  Urology consulted for renal CT showing mild right hydronephrosis with 12mm right proximal ureteral stone, hemorrhage  Interim History / Subjective:  Off vasopressors since yesterday Improving renal function Hgb down to 6.2, plts 23 overnight, transfused PRBC and plts Currently no complaints, has multiple concerns about his nutrition/ diet  Objective:  Blood pressure (!) 147/52, pulse 76, temperature 97.9 F (36.6 C), temperature source Oral, resp. rate 20, height 6' (1.829 m), weight 79.6 kg, SpO2 96%. CVP:  [12 mmHg] 12 mmHg  FiO2 (%):  [28 %-100 %] 28 %   Intake/Output Summary (Last 24 hours) at 02/08/2023 1218 Last data filed at 02/08/2023 1115 Gross per 24 hour  Intake 3244.44 ml  Output 2515 ml  Net 729.44 ml   Filed Weights   02/06/23 0500 02/07/23 0500 02/08/23 0500   Weight: 72.8 kg 73.8 kg 79.6 kg    Examination: General:  very pleasant frail appearing elderly male sitting upright in bed eating lunch in NAD HEENT: MM pink/moist, pupils 3/r Neuro: Awake, oriented x 3, MAE-generalized weakness CV:  irir, rate controlled, SEM, PICC RUE PULM:  non labored, clear anteriorly, few right basilar crackles, on 2L GI: soft, bs+, NT, foley- cyu, rectal pouch- brown liquid Extremities: warm/dry, no pitting tibial edema  Skin: extensive ecchymosis to BUEs, noted hematoma to L elbow area under BP cuff  Labs> WBC stable 12.3, Hgb/ HCT  7.4/ 23> 6.2/ 19.9> 6.2/20.3, plts 25> 23> 23, Na 154> 145, glucose 484, BUN/ sCr 98/ 2.33> 80/ 1.83, mag 2.3, phos 3.1  UOP 1.3L/ 24hrs +1.8L Net +2L afebrile  Labs/imaging personally reviewed:  Echo 1/26 > EF 60-65%, mod LVH, RV pressure overload with RVSP 61, LA and RA severely dilated  CT renal 1/27 >  1. A 12 mm proximal right ureteral stone with mild right hydronephrosis. 2. Additional nonobstructing right renal calculi. 3. Hemorrhage within the dominant 8.5 cm cyst in the inferior pole of the right kidney. Active bleed or underlying malignancy is not excluded but cannot be evaluated on this noncontrast CT. Urology consult and follow-up recommended to exclude a bleeding mass. 4. Small intramuscular bleed within the left iliopsoas. Correlation with anticoagulation status recommended. 5. Sigmoid diverticulosis. No bowel obstruction. Normal appendix. 6. Small bilateral pleural effusions with associated partial compressive atelectasis of the lung bases. 7.  Aortic Atherosclerosis  Assessment & Plan:   Acute hypoxic respiratory failure - multifactorial in setting aspiration PNA (?chronic), fluid overload/CHF exacerbation, atelectasis, dysphagia - Doubt much CHF/fluid  in play 1/29, CVP and co-ox both reassuring. - Likely chronic component of aspiration/ dysphagia, with prior MBS 2018 showing proximal esophageal  dysfunction P:  - remains on [place] with stable oxygenation and WOB.  Continue to monitor - abx as below - diet per SLP, pending MBS when able - aspiration precautions - cont BD, pulm hygiene, IS, HTS, CPT - PT/ OT - PPI  - DNR/ DNI  Enterobacter cloacae bacteremia - 2/2 UTI and right ureteral stone with mild hydronephrosis Septic shock - 2/2 above and aspiration PNA, resolved - 1/30 was not a candidate for perc nephrostomy tube given elevated INR and thrombocytopenia.  S/p cystoscopy with right ureteral stent placement 1/30 P:  - off pressors, remains hemodynamically stable, goal MAP > 65 - cont to monitor H/H, if going back on pressors need to rule out ABLA given below - cont cefepime day 6x - follow repeat [facility] 1/29 and post procedure UC - trend WBC/ fever curve    Left iliopsoas intramuscular bleed Right renal cyst with hemorrhage  LUE hematoma - Hgb/ plt drop overnight.  Found to have left UE hematoma, likely due to cuff possible etiology, +/- small dilutional component.  Remains  hemodynamic stability and no reports of pain - will transfuse 1u PRBC, 1u plts, and 1 FFP given ongoing coagulopathy and recheck post transfusion - goal Hgb > 7 - SCDs for now   Ileus and less likely SBO - had NGT but pt removed this 1/27, passing flatus - reglan x 3 days started 1/28 - continues to have more liquid BM.  Continue to monitor, prn bowel regimen   AKI  - worsened after IV diuresis, shock state, mild R hydronephrosis with right renal calculi Hypernatremia, improved Hypophosphatemia  -s/p stent placement 1/30, appreciate urology assistance - cont foley per urology - stop MIVF D5 as taking orals now - renal function continues improve, good UOP - Trend BMP / urinary output/ strict I/Os - Replace electrolytes as indicated - Avoid nephrotoxic agents, ensure adequate renal perfusion   Afib Valvular disease s/p bioprosthetic AVR 2012, w/ MV and TV repair  Pulmonary HTN- mixed group II  and III CAD, non-obstructive  HFpEF - HR remains controlled, tele monitoring - optimize electrolytes - cont to hold pta BB until Hgb stabilizes - cont to hold pta coumadin - cardiology s/o 1/29 - goal is euvolemic    Anemia of chronic disease Acute on chronic Thrombocytopenia Coagulapathy on coumadin  - repeat CBC/ coags after transfusions and trend daily/ prn - transfuse for Hgb < 7 or significant bleeding - cont to hold coumadin.  INR 2.7   Protein calorie malnutrition - maximize diet, will get RD consult as pt has many questions   Hyperglycemia, likely stress response - difference b/t FBG and serum.  Stopping D5 which will help.  Getting RD input as well  - add SSI prn, CBG q 4, goal 140-180   Goals of care. - changed back to DNR/ DNI 1/30, vasopressor support ok   Best practice (evaluated daily):  Code Status:  DNR/ DNI DVT prophylaxis: SCD's.  Diet: per SLP Activity: bedrest for now, PT when able Lines: PICC, maintain foley Disposition : ICU for now   CC time: 35 mins     Posey Boyer, MSN, AG-ACNP-BC [facility] 02/08/2023, 12:18 PM  See Amion for pager If no response to pager , please call [phone] until 7pm After 7:00 pm call Elink  [phone]

## 2023-02-08 NOTE — Progress Notes (Signed)
Per Dr. Truett Perna: Wants to see him in 2 weeks w/lab. Scheduler notified.

## 2023-02-08 NOTE — Progress Notes (Addendum)
[name].   DOB:10-10-1935   ZO#:[id number]      ASSESSMENT & PLAN:   1.  Acute anemia Thrombocytopenia -Likely secondary to sepsis.  Thrombocytopenia potentially secondary to MDS. - Hemoglobin low 6.2.  Agree with plan to give PRBC transfusion today. - Recommend transfuse PRBC for Hgb <7.0. - Platelets low 23K today.  Agree with plan to give platelet transfusion today due to active bleed.  Bleeding noted around mouth and oral cavity.  Patient with multiple large ecchymosis on bilateral upper extremity, left worse than right. - Transfuse platelets for counts <20K or bleeding - Blood smear reviewed - Monitor CBC with differential closely. - Medical oncology/Dr. Truett Perna following closely.  Patient will be followed up as an outpatient upon discharge.  2.  Urosepsis - Status post cystoscopy and right ureteral stent placement on 02/07/2023. - Continue antibiotics as ordered -Urology following closely - Monitor fever curve  3.  A-fib - Was on anticoagulation with Coumadin.  Held at this time due to bleed. -Cardiology managing anticoagulation.   Code Status DNR-Limited   Subjective:  Patient is seen awake and alert laying supine in bed.  Currently receiving platelet transfusion.  Patient appears weak and frail.  Denies pain, nausea, vomiting.  Admits to "discomfort" in his mouth and slight pain left hip.  No other acute complaints offered.  Objective:  Vitals:   02/08/23 0813 02/08/23 0900  BP:    Pulse:  64  Resp:  19  Temp: 97.7 F (36.5 C)   SpO2:  97%     Intake/Output Summary (Last 24 hours) at 02/08/2023 9811 Last data filed at 02/08/2023 9147 Gross per 24 hour  Intake 3176.9 ml  Output 2120 ml  Net 1056.9 ml     REVIEW OF SYSTEMS:   Constitutional: Denies fevers, chills or abnormal night sweats Eyes: Denies blurriness of vision, double vision or watery eyes Ears, nose, mouth, throat, and face: Denies mucositis or sore throat Respiratory: Denies  cough, dyspnea or wheezes Cardiovascular: Denies palpitation, chest discomfort or lower extremity swelling Gastrointestinal:  Denies nausea, heartburn or change in bowel habits Skin: Denies abnormal skin rashes Lymphatics: Denies new lymphadenopathy or easy bruising Neurological: Denies numbness, tingling or new weaknesses Behavioral/Psych: Mood is stable, no new changes  All other systems were reviewed with the patient and are negative.  PHYSICAL EXAMINATION: ECOG PERFORMANCE STATUS: 3 - Symptomatic, >50% confined to bed  Vitals:   02/08/23 0813 02/08/23 0900  BP:    Pulse:  64  Resp:  19  Temp: 97.7 F (36.5 C)   SpO2:  97%   Filed Weights   02/06/23 0500 02/07/23 0500 02/08/23 0500  Weight: 160 lb 7.9 oz (72.8 kg) 162 lb 11.2 oz (73.8 kg) 175 lb 7.8 oz (79.6 kg)    GENERAL: alert, + ill-appearing SKIN: + Pale skin color, + multiple ecchymotic areas over bilateral upper extremities  EYES: normal, conjunctiva are pink and non-injected, sclera clear OROPHARYNX: no exudate, no erythema and lips, buccal mucosa, and tongue normal  NECK: supple, thyroid normal size, non-tender, without nodularity LYMPH: no palpable lymphadenopathy in the cervical, axillary or inguinal LUNGS: clear to auscultation and percussion with normal breathing effort HEART: regular rate & rhythm and no murmurs and no lower extremity edema ABDOMEN: abdomen soft, non-tender and normal bowel sounds MUSCULOSKELETAL: no cyanosis of digits and no clubbing  PSYCH: alert & oriented x 3 with fluent speech NEURO: no focal motor/sensory deficits   All questions were answered. The patient knows  to call the clinic with any problems, questions or concerns.   The total time spent in the appointment was 40 minutes encounter with patient including review of chart and various tests results, discussions about plan of care and coordination of care plan  Dawson Bills, NP 02/08/2023 9:36 AM    Labs Reviewed:  Lab Results   Component Value Date   WBC 12.3 (H) 02/08/2023   HGB 6.2 (LL) 02/08/2023   HCT 20.3 (L) 02/08/2023   MCV 95.8 02/08/2023   PLT 23 (LL) 02/08/2023    Blood smear 02/07/2023: The platelets are decreased in number.  No platelet clumps.  The majority the white cells are mature neutrophils with toxic granulations and lymphocytes.  There are ovalocytes, teardrops, target cells, and burr cells.  The polychromasia is not increased.  No nucleated red cells.  No blasts.  Recent Labs    02/03/23 0402 02/03/23 2348 02/04/23 0814 02/07/23 0527 02/07/23 1717 02/08/23 0620  NA 140 145   < > 156* 154* 145  K 4.7 5.3*   < > 4.2 4.9 3.6  CL 107 110   < > 123* 123* 113*  CO2 21* 22   < > 21* 22 23  GLUCOSE 184* 139*   < > 155* 314* 484*  BUN 40* 63*   < > 99* 98* 80*  CREATININE 1.47* 2.51*   < > 2.33* 2.33* 1.83*  CALCIUM 8.7* 8.6*   < > 8.9 8.2* 7.7*  GFRNONAA 46* 24*   < > 27* 27* 36*  PROT 6.9 7.2  --   --   --  5.2*  ALBUMIN 3.4* 3.4*  --   --  2.2* 2.1*  AST 27 56*  --   --   --  48*  ALT 16 23  --   --   --  33  ALKPHOS 40 52  --   --   --  106  BILITOT 1.4* 2.1*  --   --   --  1.2   < > = values in this interval not displayed.    Studies Reviewed:  DG C-Arm 1-60 Min-No Report Result Date: 02/07/2023 Fluoroscopy was utilized by the requesting physician.  No radiographic interpretation.   CT RENAL STONE STUDY Result Date: 02/06/2023 CLINICAL DATA:  Abdominal/flank pain.  Concern for kidney stone. EXAM: CT ABDOMEN AND PELVIS WITHOUT CONTRAST TECHNIQUE: Multidetector CT imaging of the abdomen and pelvis was performed following the standard protocol without IV contrast. RADIATION DOSE REDUCTION: This exam was performed according to the departmental dose-optimization program which includes automated exposure control, adjustment of the mA and/or kV according to patient size and/or use of iterative reconstruction technique. COMPARISON:  CT abdomen pelvis dated 09/23/2021. FINDINGS: Evaluation  of this exam is limited in the absence of intravenous contrast. Lower chest: Small bilateral pleural effusions with associated partial compressive atelectasis of the lung bases. Mild cardiomegaly. Tricuspid mechanical bowel. Coronary vascular calcification. No intra-abdominal free air or free fluid. Hepatobiliary: The liver is unremarkable. No biliary dilatation. Cholecystectomy. No retained calcified stone noted in the central CBD. Pancreas: Unremarkable. No pancreatic ductal dilatation or surrounding inflammatory changes. Spleen: Normal in size without focal abnormality. Adrenals/Urinary Tract: The adrenal glands unremarkable. There is a 12 mm stone in the proximal right ureter close to the ureteropelvic junction with mild right hydronephrosis. Several additional nonobstructing right renal calculi measure up to 1 cm in the interpolar aspect of the right kidney. Multiple right renal cysts are poorly characterized on this  noncontrast CT but were present on the prior CT. A 12 mm high attenuating exophytic cyst from the inferior pole of the right kidney likely represents a hemorrhagic or proteinaceous cyst. There has been interval development of high attenuating content within a exophytic 8.5 cm cyst arising from the inferior pole of the right kidney consistent with hemorrhage. The possibility of active lesional is not excluded but cannot be evaluated on this noncontrast CT. No significant perinephric fluid. There is no hydronephrosis or nephrolithiasis on the left. The left ureter is unremarkable. The urinary bladder is decompressed around a Foley catheter. Stomach/Bowel: There is sigmoid diverticulosis. There is no bowel obstruction or active inflammation. The appendix is normal. Vascular/Lymphatic: Mild aortoiliac atherosclerotic disease. The IVC is unremarkable. No portal gas. There is no adenopathy. Reproductive: Mildly enlarged prostate gland measuring 5 cm in transverse axial diameter. The seminal vesicles are  symmetric. Other: Midline vertical anterior abdominal wall incisional scar. There is mild asymmetric enlargement of the left iliacus muscle with an ill-defined high attenuating area suspicious for an intramuscular bleed. Slight asymmetric enlargement of the left psoas muscle also noted. Musculoskeletal: Small fat containing midline supraumbilical hernia. There is osteopenia with degenerative changes of the spine. No acute osseous pathology. Total left hip arthroplasty. IMPRESSION: 1. A 12 mm proximal right ureteral stone with mild right hydronephrosis. 2. Additional nonobstructing right renal calculi. 3. Hemorrhage within the dominant 8.5 cm cyst in the inferior pole of the right kidney. Active bleed or underlying malignancy is not excluded but cannot be evaluated on this noncontrast CT. Urology consult and follow-up recommended to exclude a bleeding mass. 4. Small intramuscular bleed within the left iliopsoas. Correlation with anticoagulation status recommended. 5. Sigmoid diverticulosis. No bowel obstruction. Normal appendix. 6. Small bilateral pleural effusions with associated partial compressive atelectasis of the lung bases. 7.  Aortic Atherosclerosis (ICD10-I70.0). Electronically Signed   By: Elgie Collard M.D.   On: 02/06/2023 10:54   DG CHEST PORT 1 VIEW Result Date: 02/05/2023 CLINICAL DATA:  PICC placement. EXAM: PORTABLE CHEST 1 VIEW COMPARISON:  Chest radiograph dated 02/04/2023. FINDINGS: Right-sided PICC with tip over central SVC. Improved aeration of the lungs compared to prior radiograph. No focal consolidation or pneumothorax. Trace bilateral pleural effusions suspected. Stable cardiac silhouette. Median sternotomy wires and mechanical cardiac valve. No acute osseous pathology. IMPRESSION: 1. Right-sided PICC with tip over central SVC. 2. Improved aeration of the lungs. Electronically Signed   By: Elgie Collard M.D.   On: 02/05/2023 15:12   [place] EKG SITE RITE Result Date: 02/05/2023 If  Site Rite image not attached, placement could not be confirmed due to current cardiac rhythm.  DG CHEST PORT 1 VIEW Result Date: 02/04/2023 CLINICAL DATA:  Shortness of breath. EXAM: PORTABLE CHEST 1 VIEW COMPARISON:  Chest radiograph dated 02/03/2023. FINDINGS: There is cardiomegaly with vascular congestion. No focal consolidation, pleural effusion, pneumothorax. Median sternotomy wires and mechanical cardiac valve. No acute osseous pathology. IMPRESSION: Cardiomegaly with vascular congestion. Electronically Signed   By: Elgie Collard M.D.   On: 02/04/2023 14:25   DG Abd 1 View Result Date: 02/04/2023 CLINICAL DATA:  Nausea and vomiting. EXAM: ABDOMEN - 1 VIEW COMPARISON:  02/03/2023 FINDINGS: Nasogastric tube tip is in the left upper abdomen and likely in the gastric body region. Bowel gas in the abdomen and pelvis with a nonobstructive pattern. Surgical clips in the right abdomen. Left hip arthroplasty. Surgical clips in the right groin. Two large calcifications in the region of the right renal shadow both measuring  approximately 1 cm. 1 of these calcifications could be in the renal pelvic region. IMPRESSION: 1. Right renal calculi, largest measuring up to 1.0 cm. One stone could be in the renal pelvic region. This could be better evaluated with CT imaging. 2. Nonobstructive bowel gas pattern. 3. Tip of the nasogastric tube is in the gastric body region. Electronically Signed   By: Richarda Overlie M.D.   On: 02/04/2023 10:04   ECHOCARDIOGRAM COMPLETE Result Date: 02/03/2023    ECHOCARDIOGRAM REPORT   Patient Name:   [name]. Date of Exam: 02/03/2023 Medical Rec #:  [id number]             Height:       72.0 in Accession #:    [id number]            Weight:       173.3 lb Date of Birth:  January 02, 1936             BSA:          2.005 m Patient Age:    87 years              BP:           91/64 mmHg Patient Gender: M                     HR:           76 bpm. Exam Location:  Inpatient Procedure: 2D Echo,  Cardiac Doppler, Color Doppler and Intracardiac            Opacification Agent Indications:    TIA  History:        Patient has prior history of Echocardiogram examinations, most                 recent 03/19/2022. Previous Myocardial Infarction and CAD, COPD,                 Arrythmias:Atrial Fibrillation; Risk Factors:Hypertension.                 Aortic Valve: bioprosthetic valve is present in the aortic                 position.                 Mitral Valve: prosthetic annuloplasty ring valve is present in                 the mitral position.  Sonographer:    Webb Laws Referring Phys: [id number] DAVID MANUEL ORTIZ IMPRESSIONS  1. S/P MV repair with mean gradient 8 mmHg and no MR; s/p AVR with mean gradient 14 mmHg and no AI; s/p TV repair with mild TR.  2. Left ventricular ejection fraction, by estimation, is 60 to 65%. The left ventricle has normal function. The left ventricle has no regional wall motion abnormalities. There is moderate left ventricular hypertrophy. Left ventricular diastolic parameters are indeterminate. There is the interventricular septum is flattened in systole and diastole, consistent with right ventricular pressure and volume overload.  3. Right ventricular systolic function is normal. The right ventricular size is mildly enlarged. There is severely elevated pulmonary artery systolic pressure.  4. Left atrial size was severely dilated.  5. Right atrial size was severely dilated.  6. The mitral valve has been repaired/replaced. No evidence of mitral valve regurgitation. No evidence of mitral stenosis. There is a prosthetic annuloplasty ring present in the mitral position.  7.  The tricuspid valve is has been repaired/replaced. The tricuspid valve is status post repair with an annuloplasty ring.  8. The aortic valve has been repaired/replaced. Aortic valve regurgitation is not visualized. No aortic stenosis is present. There is a bioprosthetic valve present in the aortic position.  9. The  inferior vena cava is dilated in size with <50% respiratory variability, suggesting right atrial pressure of 15 mmHg. FINDINGS  Left Ventricle: Left ventricular ejection fraction, by estimation, is 60 to 65%. The left ventricle has normal function. The left ventricle has no regional wall motion abnormalities. Definity contrast agent was given IV to delineate the left ventricular  endocardial borders. The left ventricular internal cavity size was normal in size. There is moderate left ventricular hypertrophy. The interventricular septum is flattened in systole and diastole, consistent with right ventricular pressure and volume overload. Left ventricular diastolic parameters are indeterminate. Right Ventricle: The right ventricular size is mildly enlarged. Right ventricular systolic function is normal. There is severely elevated pulmonary artery systolic pressure. The tricuspid regurgitant velocity is 3.39 m/s, and with an assumed right atrial  pressure of 15 mmHg, the estimated right ventricular systolic pressure is 61.0 mmHg. Left Atrium: Left atrial size was severely dilated. Right Atrium: Right atrial size was severely dilated. Pericardium: There is no evidence of pericardial effusion. Mitral Valve: The mitral valve has been repaired/replaced. No evidence of mitral valve regurgitation. There is a prosthetic annuloplasty ring present in the mitral position. No evidence of mitral valve stenosis. MV peak gradient, 22.5 mmHg. The mean mitral valve gradient is 8.0 mmHg. Tricuspid Valve: The tricuspid valve is has been repaired/replaced. Tricuspid valve regurgitation is mild . No evidence of tricuspid stenosis. The tricuspid valve is status post repair with an annuloplasty ring. Aortic Valve: The aortic valve has been repaired/replaced. Aortic valve regurgitation is not visualized. No aortic stenosis is present. Aortic valve mean gradient measures 14.2 mmHg. Aortic valve peak gradient measures 17.8 mmHg. Aortic valve  area, by VTI measures 1.60 cm. There is a bioprosthetic valve present in the aortic position. Pulmonic Valve: The pulmonic valve was normal in structure. Pulmonic valve regurgitation is mild. No evidence of pulmonic stenosis. Aorta: The aortic root is normal in size and structure. Venous: The inferior vena cava is dilated in size with less than 50% respiratory variability, suggesting right atrial pressure of 15 mmHg. IAS/Shunts: No atrial level shunt detected by color flow Doppler. Additional Comments: S/P MV repair with mean gradient 8 mmHg and no MR; s/p AVR with mean gradient 14 mmHg and no AI; s/p TV repair with mild TR.  LEFT VENTRICLE PLAX 2D LVIDd:         4.50 cm      Diastology LVIDs:         3.50 cm      LV e' medial:    3.37 cm/s LV PW:         1.20 cm      LV E/e' medial:  59.6 LV IVS:        1.40 cm      LV e' lateral:   7.40 cm/s LVOT diam:     2.10 cm      LV E/e' lateral: 27.2 LV SV:         72 LV SV Index:   36 LVOT Area:     3.46 cm  LV Volumes (MOD) LV vol d, MOD A2C: 120.0 ml LV vol d, MOD A4C: 98.9 ml LV vol s, MOD A2C: 55.1 ml  LV vol s, MOD A4C: 41.1 ml LV SV MOD A2C:     64.9 ml LV SV MOD A4C:     98.9 ml LV SV MOD BP:      60.2 ml RIGHT VENTRICLE             IVC RV Basal diam:  4.65 cm     IVC diam: 3.20 cm RV Mid diam:    4.20 cm RV S prime:     10.30 cm/s TAPSE (M-mode): 1.6 cm LEFT ATRIUM              Index        RIGHT ATRIUM           Index LA diam:        4.70 cm  2.34 cm/m   RA Area:     21.10 cm LA Vol (A2C):   141.0 ml 70.34 ml/m  RA Volume:   54.90 ml  27.39 ml/m LA Vol (A4C):   98.7 ml  49.24 ml/m LA Biplane Vol: 120.0 ml 59.86 ml/m  AORTIC VALVE                     PULMONIC VALVE AV Area (Vmax):    1.99 cm      PR End Diast Vel: 1.85 msec AV Area (Vmean):   1.63 cm AV Area (VTI):     1.60 cm AV Vmax:           211.12 cm/s AV Vmean:          176.800 cm/s AV VTI:            0.452 m AV Peak Grad:      17.8 mmHg AV Mean Grad:      14.2 mmHg LVOT Vmax:         121.00 cm/s  LVOT Vmean:        83.000 cm/s LVOT VTI:          0.209 m LVOT/AV VTI ratio: 0.46  AORTA Ao Root diam: 3.30 cm Ao Asc diam:  3.50 cm MITRAL VALVE                TRICUSPID VALVE MV Area (PHT): 1.86 cm     TR Peak grad:   46.0 mmHg MV Area VTI:   1.10 cm     TR Vmax:        339.00 cm/s MV Peak grad:  22.5 mmHg MV Mean grad:  8.0 mmHg     SHUNTS MV Vmax:       2.37 m/s     Systemic VTI:  0.21 m MV Vmean:      132.0 cm/s   Systemic Diam: 2.10 cm MV Decel Time: 408 msec MV E velocity: 201.00 cm/s MV A velocity: 55.60 cm/s MV E/A ratio:  3.62 Olga Millers MD Electronically signed by Olga Millers MD Signature Date/Time: 02/03/2023/3:57:16 PM    Final    DG Abd 1 View Result Date: 02/03/2023 CLINICAL DATA:  Nasogastric tube placement. EXAM: ABDOMEN - 1 VIEW COMPARISON:  Earlier today FINDINGS: The enteric tube has been advanced, the tip and side port are below the diaphragm in the stomach. No bowel dilatation in the upper abdomen. Right upper quadrant surgical clips. IMPRESSION: Enteric tube tip and side port below the diaphragm in the stomach. Electronically Signed   By: Narda Rutherford M.D.   On: 02/03/2023 15:50   DG Abd 1 View Result Date: 02/03/2023 CLINICAL DATA:  Nausea and vomiting EXAM: ABDOMEN - 1 VIEW; PORTABLE ABDOMEN - 1 VIEW COMPARISON:  None Available. FINDINGS: Sequential images of the upper abdomen demonstrate placement of an esophagogastric tube, tip below the diaphragm, side port above the gastroesophageal junction. Nonobstructive pattern of included bowel gas. No free air on supine radiographs. Cardiomegaly status post median sternotomy with valvular prosthesis and left atrial appendage clip. IMPRESSION: 1. Sequential images of the upper abdomen demonstrate placement of an esophagogastric tube, tip below the diaphragm, side port above the gastroesophageal junction. Recommend advancement. 2. Nonobstructive pattern of included bowel gas. Electronically Signed   By: Jearld Lesch M.D.   On:  02/03/2023 13:22   DG Abd Portable 1V Result Date: 02/03/2023 CLINICAL DATA:  Nausea and vomiting EXAM: ABDOMEN - 1 VIEW; PORTABLE ABDOMEN - 1 VIEW COMPARISON:  None Available. FINDINGS: Sequential images of the upper abdomen demonstrate placement of an esophagogastric tube, tip below the diaphragm, side port above the gastroesophageal junction. Nonobstructive pattern of included bowel gas. No free air on supine radiographs. Cardiomegaly status post median sternotomy with valvular prosthesis and left atrial appendage clip. IMPRESSION: 1. Sequential images of the upper abdomen demonstrate placement of an esophagogastric tube, tip below the diaphragm, side port above the gastroesophageal junction. Recommend advancement. 2. Nonobstructive pattern of included bowel gas. Electronically Signed   By: Jearld Lesch M.D.   On: 02/03/2023 13:22   DG CHEST PORT 1 VIEW Result Date: 02/03/2023 CLINICAL DATA:  Hypoxia, status post aortic valve replacement EXAM: PORTABLE CHEST 1 VIEW COMPARISON:  02/02/2023 FINDINGS: Cardiomegaly status post median sternotomy with aortic valve prosthesis and left atrial appendage clip. Similar retrocardiac atelectasis. Mild diffuse interstitial opacity. No acute osseous findings. IMPRESSION: 1. Cardiomegaly status post median sternotomy with aortic valve prosthesis and left atrial appendage clip. 2. Similar retrocardiac atelectasis. Mild diffuse interstitial opacity, consistent with edema. Electronically Signed   By: Jearld Lesch M.D.   On: 02/03/2023 12:22   DG Chest Portable 1 View Result Date: 02/02/2023 CLINICAL DATA:  Shortness of breath. EXAM: PORTABLE CHEST 1 VIEW COMPARISON:  02/16/2022 FINDINGS: The cardio pericardial silhouette is enlarged. Diffuse interstitial opacity suggests edema. No focal lung consolidation. No substantial pleural effusion. Bones are diffusely demineralized. Telemetry leads overlie the chest. IMPRESSION: Enlargement of the cardiopericardial silhouette with  diffuse interstitial opacity suggesting edema. Electronically Signed   By: Kennith Center M.D.   On: 02/02/2023 08:21   DG Foot Complete Right Result Date: 01/10/2023 Please see detailed radiograph report in office note.  DG Foot Complete Left Result Date: 01/10/2023 Please see detailed radiograph report in office note.  Mr. Mcquerry is known to me with a history of chronic mild anemia/thrombocytopenia.  He had an acute drop in the hemoglobin following knee replacement surgery in September 2023 and August 2024.  When I saw him in October 2020 for the hemoglobin was partially improved and the platelet count was in the low normal range.  The chronic anemia may be related to unrecognized chronic blood loss secondary to anticoagulation therapy or he may have early myelodysplasia.  He is now admitted with urosepsis/respiratory failure.  He has acute anemia/thrombocytopenia, likely secondary to sepsis.  The anemia is also in part secondary to bleeding and phlebotomy.  I have a low clinical suspicion for a primary hematologic diagnosis.  The platelet count should improve over the next week.  We will consider additional evaluation if the platelet count does not improve following resolution of sepsis.  I would continue to hold Coumadin and consider  advocate to reverse the Coumadin if he only needs anticoagulation for atrial fibrillation   The persistent elevation in the PT while off of Coumadin is likely related to malnutrition, polypharmacy, and potentially DIC.  We will check a fibrinogen level.  Recommendations: Continue antibiotics, management of sepsis syndrome per critical care medicine Treatment of the right ureter stone per nephrology Transfuse packed red blood cells for symptomatic anemia and as needed Transfuse platelets for a count of less than 20,000 or bleeding Hold Coumadin, monitor daily PT/INR consider reversing Coumadin vitamin K for bleeding, check fibrinogen Please call hematology for the  weekend as needed, I will check on him 02/11/2023 Outpatient follow-up will be scheduled in the hematology clinic

## 2023-02-08 NOTE — Progress Notes (Signed)
Initial Nutrition Assessment  DOCUMENTATION CODES:   Non-severe (moderate) malnutrition in context of chronic illness  INTERVENTION:  - Diet per SLP recs. - Ensure Plus High Protein po TID, each supplement provides 350 kcal and 20 grams of protein. - Add Multivitamin with minerals daily - Monitor weight trends.  NUTRITION DIAGNOSIS:   Moderate Malnutrition related to chronic illness as evidenced by moderate fat depletion, severe muscle depletion.  GOAL:   Patient will meet greater than or equal to 90% of their needs  MONITOR:   PO intake, Supplement acceptance, Diet advancement, Weight trends   REASON FOR ASSESSMENT:   Consult Assessment of nutrition requirement/status  ASSESSMENT:   88 y.o. male who has an PMH significant for CAD, CVA, HTN, pulmonary hypertension, CHF, and small bowel obstruction who presented with dyspnea. Admitted for acute respiratory failure due to aspiration PNA and CHF exacerbation.   Patient reports a UBW of 175# and no changes PTA but suspects his current weight is elevated due to fluid.  Per EMR, weight stable from 170-177# over the past year. Weights this admission have varied from 160-175# so current accurate weight status difficult to assess.  He endorses typically eating well at home with 3 meals a day. Usually has a grits bowl with scrambled eggs and ham for breakfast, a light lunch of soup or a salad, and a dinner out to eat at restaurants such as [employer], [employer], or [employer]. Notes he plays Bridge on Monday and Fridays and on those days drinks an Ensure for lunch instead of eating a meal. Eating normally up until admission.   Since admit, patient has been mostly NPO or on full liquids. No meal intakes documented to assess oral intake. He is being followed by SLP with most recent recommendation for thin full liquids. Per discussion with SLP, plan for MBS soon to hopefully advance diet.   Patient reports tolerating full liquids fairly  well. Mostly having broth and Ensure, which he reports tolerating the best.  Encouraged patient to consume Ensure each time it is offered for the most calorie and protein dense food option he can have on current diet. Patient worried about his blood sugars, which have been elevated. However, he has been receiving D5 @100mL /hr since yesterday which provides 408 kcals in 24 hours. Suspect this has been elevating blood sugars as patient HA1C is 5.5 and his blood sugars were well controled prior to being on D5.  Patient is hopeful to have diet advanced soon but for now will continue to drink Ensure to support needs.   Admit weight: 173# Current weight: 175# I&O's: +2.1L  Medications reviewed and include: Colace  Labs reviewed:  Creatinine 1.83 HA1C 5.5 Blood Glucose 142-278 x24 hours   NUTRITION - FOCUSED PHYSICAL EXAM:  Flowsheet Row Most Recent Value  Orbital Region Mild depletion  Upper Arm Region Moderate depletion  Thoracic and Lumbar Region Mild depletion  Buccal Region Moderate depletion  Temple Region Moderate depletion  Clavicle Bone Region Severe depletion  Clavicle and Acromion Bone Region Severe depletion  Scapular Bone Region Unable to assess  Dorsal Hand Mild depletion  Patellar Region Moderate depletion  Anterior Thigh Region Moderate depletion  Posterior Calf Region Moderate depletion  Edema (RD Assessment) None  Hair Reviewed  Eyes Reviewed  Mouth Reviewed  Skin Reviewed  Nails Reviewed       Diet Order:   Diet Order             Diet full liquid Room service  appropriate? Yes; Fluid consistency: Thin  Diet effective now                   EDUCATION NEEDS:  Education needs have been addressed  Skin:  Skin Assessment: Reviewed RN Assessment  Last BM:  1/31 - type 6  Height:  Ht Readings from Last 1 Encounters:  02/07/23 6' (1.829 m)   Weight:  Wt Readings from Last 1 Encounters:  02/08/23 79.6 kg    BMI:  Body mass index is 23.8  kg/m.  Estimated Nutritional Needs:  Kcal:  1800-2050 kcals Protein:  85-100 grams Fluid:  >/= 1.8L    Shelle Iron RD, LDN Contact via Secure Chat.

## 2023-02-09 ENCOUNTER — Inpatient Hospital Stay (HOSPITAL_COMMUNITY): Payer: Medicare Other

## 2023-02-09 DIAGNOSIS — G8194 Hemiplegia, unspecified affecting left nondominant side: Secondary | ICD-10-CM | POA: Diagnosis not present

## 2023-02-09 DIAGNOSIS — Z8679 Personal history of other diseases of the circulatory system: Secondary | ICD-10-CM

## 2023-02-09 DIAGNOSIS — I4821 Permanent atrial fibrillation: Secondary | ICD-10-CM | POA: Diagnosis not present

## 2023-02-09 DIAGNOSIS — Z8673 Personal history of transient ischemic attack (TIA), and cerebral infarction without residual deficits: Secondary | ICD-10-CM

## 2023-02-09 DIAGNOSIS — Z9889 Other specified postprocedural states: Secondary | ICD-10-CM | POA: Diagnosis not present

## 2023-02-09 DIAGNOSIS — J9601 Acute respiratory failure with hypoxia: Secondary | ICD-10-CM | POA: Diagnosis not present

## 2023-02-09 DIAGNOSIS — A4159 Other Gram-negative sepsis: Secondary | ICD-10-CM | POA: Diagnosis not present

## 2023-02-09 DIAGNOSIS — I2510 Atherosclerotic heart disease of native coronary artery without angina pectoris: Secondary | ICD-10-CM

## 2023-02-09 LAB — BLOOD CULTURE ID PANEL (REFLEXED) - BCID2

## 2023-02-09 LAB — MAGNESIUM: Magnesium: 2.2 mg/dL (ref 1.7–2.4)

## 2023-02-09 LAB — CBC
HCT: 23.8 % — ABNORMAL LOW (ref 39.0–52.0)
Hemoglobin: 7.6 g/dL — ABNORMAL LOW (ref 13.0–17.0)
MCH: 30.2 pg (ref 26.0–34.0)
MCHC: 31.9 g/dL (ref 30.0–36.0)
MCV: 94.4 fL (ref 80.0–100.0)
Platelets: 43 10*3/uL — ABNORMAL LOW (ref 150–400)
RBC: 2.52 MIL/uL — ABNORMAL LOW (ref 4.22–5.81)
RDW: 17.6 % — ABNORMAL HIGH (ref 11.5–15.5)
WBC: 13.1 10*3/uL — ABNORMAL HIGH (ref 4.0–10.5)
nRBC: 0 % (ref 0.0–0.2)

## 2023-02-09 LAB — PROTIME-INR
INR: 2.1 — ABNORMAL HIGH (ref 0.8–1.2)
Prothrombin Time: 23.8 s — ABNORMAL HIGH (ref 11.4–15.2)

## 2023-02-09 LAB — BASIC METABOLIC PANEL
Anion gap: 8 (ref 5–15)
Anion gap: 9 (ref 5–15)
BUN: 65 mg/dL — ABNORMAL HIGH (ref 8–23)
BUN: 66 mg/dL — ABNORMAL HIGH (ref 8–23)
CO2: 26 mmol/L (ref 22–32)
CO2: 26 mmol/L (ref 22–32)
Calcium: 8.7 mg/dL — ABNORMAL LOW (ref 8.9–10.3)
Calcium: 8.8 mg/dL — ABNORMAL LOW (ref 8.9–10.3)
Chloride: 124 mmol/L — ABNORMAL HIGH (ref 98–111)
Chloride: 123 mmol/L — ABNORMAL HIGH (ref 98–111)
Creatinine, Ser: 1.46 mg/dL — ABNORMAL HIGH (ref 0.61–1.24)
Creatinine, Ser: 1.42 mg/dL — ABNORMAL HIGH (ref 0.61–1.24)
GFR, Estimated: 47 mL/min — ABNORMAL LOW (ref >60)
GFR, Estimated: 48 mL/min — ABNORMAL LOW (ref >60)
Glucose, Bld: 155 mg/dL — ABNORMAL HIGH (ref 70–99)
Glucose, Bld: 193 mg/dL — ABNORMAL HIGH (ref 70–99)
Potassium: 4.4 mmol/L (ref 3.5–5.1)
Potassium: 4.2 mmol/L (ref 3.5–5.1)
Sodium: 158 mmol/L — ABNORMAL HIGH (ref 135–145)
Sodium: 158 mmol/L — ABNORMAL HIGH (ref 135–145)

## 2023-02-09 LAB — GLUCOSE, CAPILLARY
Glucose-Capillary: 136 mg/dL — ABNORMAL HIGH (ref 70–99)
Glucose-Capillary: 159 mg/dL — ABNORMAL HIGH (ref 70–99)
Glucose-Capillary: 157 mg/dL — ABNORMAL HIGH (ref 70–99)
Glucose-Capillary: 153 mg/dL — ABNORMAL HIGH (ref 70–99)
Glucose-Capillary: 214 mg/dL — ABNORMAL HIGH (ref 70–99)
Glucose-Capillary: 146 mg/dL — ABNORMAL HIGH (ref 70–99)

## 2023-02-09 LAB — HEMOGLOBIN A1C
Hgb A1c MFr Bld: 6.2 % — ABNORMAL HIGH (ref 4.8–5.6)
Mean Plasma Glucose: 131.24 mg/dL

## 2023-02-09 LAB — PHOSPHORUS: Phosphorus: 3.7 mg/dL (ref 2.5–4.6)

## 2023-02-09 MED ORDER — STROKE: EARLY STAGES OF RECOVERY BOOK
Freq: Once | Status: AC
  Filled 2023-02-09: qty 1

## 2023-02-09 MED ORDER — SODIUM CHLORIDE 0.9 % IV SOLN
1.0000 g | Freq: Two times a day (BID) | INTRAVENOUS | Status: DC
  Administered 2023-02-09 – 2023-02-12 (×7): 1 g via INTRAVENOUS
  Filled 2023-02-09 (×9): qty 20

## 2023-02-09 MED ORDER — DEXTROSE 5 % IV SOLN: INTRAVENOUS | Status: AC

## 2023-02-09 NOTE — Consult Note (Addendum)
[facility] Telemedicine Consult   Requesting Provider: Kalman Shan  Consult Participants: Myself, bedside nurse, atrium nurse Berna Spare,  Location of the provider: [facility]  Location of the patient: [facility]   This consult was provided via telemedicine with 2-way video and audio communication. The patient/family was informed that care would be provided in this way and agreed to receive care in this manner.   Chief Complaint: Altered speech, left-sided weakness  HPI: This is an 88 year old gentleman with a past medical history significant for right MCA stroke (November 2023) with some residual left-sided weakness, acute hypoxic respiratory failure felt to be multifactorial in the setting of possible aspiration pneumonia (possibly acute on chronic), atrial fibrillation s/p maze, on chronic Coumadin, thrombocytopenia, coronary artery disease, aortic valve replacement (2012), tricuspid valve repair (2020), SBO (2021), hyperlipidemia  He was admitted with septic shock found to have Enterobacter cloacae bacteremia secondary to right ureteral stone s/p cystoscopy with a right ureteral stent placement 1/30.  He has had multiple bleeding complications including a left iliopsoas intramuscular bleed, right renal cyst with hemorrhage, and a left upper extremity hematoma discovered 1/31 felt to be associated with blood pressure cuff.  He has also had ileus for which He has been treated with Reglan 3 times daily starting 1/28.  He has atrial fibrillation for which he has been off of home warfarin due to the aforementioned platelet and bleeding issues, for case he has required blood products (1 unit of platelets 1/29, 2 units of platelets 1/30, 1 unit of RBCs 1/30, 1 unit of fresh frozen plasma 1/31, additional platelets and red blood cells pending).  He is a DNR/DNI CODE STATUS  LKW: 10:30 AM Thrombolytic given?: No, thrombocytopenia and right ureteral stent IR Thrombectomy? No,  exam not consistent with LVO Modified Rankin Scale: 3-4-Needs assistance to walk and tend to bodily needs (PT notes indicate use of a rolling walker since at least 2020; did have a right knee replacement in August 2024) Time of teleneurologist evaluation: 1 PM  Exam: Vitals:   02/09/23 0852 02/09/23 0900  BP: (!) 188/75   Pulse:  75  Resp:  (!) 28  Temp:    SpO2:  95%    General: Pained appearing Pulmonary: breathing comfortably Cardiac: regular rate and rhythm on monitor   NIH Stroke scale 1A: Level of Consciousness - 0 1B: Ask Month and Age - 2 ("61, Jan") 1C: 'Blink Eyes' & 'Squeeze Hands' - 0 2: Test Horizontal Extraocular Movements - 0 3: Test Visual Fields - 0 to blink to threat 4: Test Facial Palsy - 0  5A: Test Left Arm Motor Drift - 1 significant asterixis noted bilaterally 5B: Test Right Arm Motor Drift - 1 6A: Test Left Leg Motor Drift - 3 6B: Test Right Leg Motor Drift - 3 7: Test Limb Ataxia - 0 within limits of asterixis and weakness 8: Test Sensation - 0 --grossly equally reactive to touch throughout 9: Test Language/Aphasia- 0 able to name and repeat within limits of attention/concentration 10: Test Dysarthria - 1 11: Test Extinction/Inattention - 0  NIHSS score: 11   Imaging Reviewed:  Chronic right MCA stroke on head CT with a left frontal lacunar hypodensity new since 11 months ago but favored to be chronic appearing  Labs reviewed in epic and pertinent values follow:  Basic Metabolic Panel: Recent Labs  Lab 02/03/23 0402 02/03/23 2348 02/06/23 0700 02/07/23 0527 02/07/23 1717 02/08/23 0620 02/09/23 0443 02/09/23 0702  NA 140   < >  149* 156* 154* 145 158* 158*  K 4.7   < > 3.8 4.2 4.9 3.6 4.4 4.2  CL 107   < > 114* 123* 123* 113* 124* 123*  CO2 21*   < > 22 21* 22 23 26 26   GLUCOSE 184*   < > 297* 155* 314* 484* 155* 193*  BUN 40*   < > 95* 99* 98* 80* 65* 66*  CREATININE 1.47*   < > 2.64* 2.33* 2.33* 1.83* 1.46* 1.42*  CALCIUM 8.7*   < >  8.4* 8.9 8.2* 7.7* 8.7* 8.8*  MG 2.3  --  2.9* 2.8*  --  2.3 2.2  --   PHOS  --   --  2.8 1.5* 3.3 3.1 3.7  --    < > = values in this interval not displayed.    CBC: Recent Labs  Lab 02/06/23 2216 02/07/23 0527 02/07/23 1455 02/07/23 2141 02/08/23 0620 02/08/23 1831 02/09/23 0443  WBC 10.8*   < > 20.8* 12.7* 12.3* 11.2* 13.1*  NEUTROABS 9.0*  --   --  9.6*  --   --   --   HGB 7.8*   < > 7.4* 6.2* 6.2* 7.3* 7.6*  HCT 25.3*   < > 23.2* 19.9* 20.3* 23.2* 23.8*  MCV 94.8   < > 93.2 95.2 95.8 95.1 94.4  PLT 32*   < > 25* 23* 23* 41* 43*   < > = values in this interval not displayed.    Coagulation Studies: Recent Labs    02/07/23 0527 02/08/23 0620 02/08/23 1831 02/09/23 0443  LABPROT 29.9* 29.9* 25.0* 23.8*  INR 2.8* 2.8* 2.2* 2.1*       Assessment: Most likely toxic/metabolic encephalopathy in the setting of his multiple medical conditions as documented above as well as possibly secondary to cefepime exposure.  However certainly he is at risk of stroke with his atrial fibrillation although his anticoagulation has been held his INR has remained above 2  Recommendations:  -No emergent CTA due to risks of contrast with his acute kidney injury and exam not consistent with LVO -MRI brain -- see addendum below -Permissive hypertension pending MRI, long-term goal normotension if MRI is reassuring against acute intracranial process -Follow-up of left psoas hematoma which could be contributing to left leg weakness --appreciate clinical exam per primary team with further imaging at their discretion -Consider narrowing of cefepime or transition to meropenem for reduced neurotoxicity if able -Neurology will follow-up MRI brain, otherwise we will be available as needed going forward -Discussed with primary team via secure chat and phone (Dr. Marchelle Gearing)  This patient is receiving care for possible acute neurological changes. There was 30 minutes of care by this provider at the time  of service, including time for direct evaluation via telemedicine, review of medical records, imaging studies and discussion of findings with providers, the patient and/or family.  Brooke Dare MD-PhD [facility] [phone]   Addendum: MRI brain personally reviewed, agree with radiology report there are small acute infarcts in the left frontal and left temporal white matter -- embolic in appearance on my review (in addition to chronic right MCA and right cerebellar infarcts) Stroke order set placed for neurochecks etc  Check A1c, Lipid panel MRA head, Carotid US Echocardiogram already recently completed, reviewed as below  Of course, he does have a risk of hemorrhagic conversion of his strokes although they are fairly small the risk is increased by his significant thrombocytopenia From a neurological perspective, would hold resumption of  anticoagulation for 1 to 2 days; but would also try to avoid any reversal of anticoagulation if medical stability permits If cannot start anticoagulation from medical perspective in 1-2 days, please start ASA 325 mg daily until safe to restart anticoagulation (then STOP antiplatelets)  Would not treat with both antiplatelet and anticoagulation Discussed with primary team via secure chat and phone Neurology will continue to follow-along for stroke workup above   Recent ECHO 02/03/23 reviewed  1. S/P MV repair with mean gradient 8 mmHg and no MR; s/p AVR with mean  gradient 14 mmHg and no AI; s/p TV repair with mild TR.   2. Left ventricular ejection fraction, by estimation, is 60 to 65%. The  left ventricle has normal function. The left ventricle has no regional  wall motion abnormalities. There is moderate left ventricular hypertrophy.  Left ventricular diastolic  parameters are indeterminate. There is the interventricular septum is  flattened in systole and diastole, consistent with right ventricular  pressure and volume overload.   3.  Right ventricular systolic function is normal. The right ventricular  size is mildly enlarged. There is severely elevated pulmonary artery  systolic pressure.   4. Left atrial size was severely dilated.   5. Right atrial size was severely dilated.   6. The mitral valve has been repaired/replaced. No evidence of mitral  valve regurgitation. No evidence of mitral stenosis. There is a prosthetic  annuloplasty ring present in the mitral position.   7. The tricuspid valve is has been repaired/replaced. The tricuspid valve  is status post repair with an annuloplasty ring.   8. The aortic valve has been repaired/replaced. Aortic valve  regurgitation is not visualized. No aortic stenosis is present. There is a  bioprosthetic valve present in the aortic position.   9. The inferior vena cava is dilated in size with <50% respiratory  variability, suggesting right atrial pressure of 15 mmHg.     If 8pm-8am, please page neurology on call as listed in AMION.  CRITICAL CARE Performed by: Gordy Councilman   Total critical care time: 30 minutes  Critical care time was exclusive of separately billable procedures and treating other patients.  Critical care was necessary to treat or prevent imminent or life-threatening deterioration --emergent evaluation and to rule out intracerebral hemorrhage, potential use of thrombolytic or thrombectomy  Critical care was time spent personally by me on the following activities: development of treatment plan with patient and/or surrogate as well as nursing, discussions with consultants, evaluation of patient's response to treatment, examination of patient, obtaining history from patient or surrogate, ordering and performing treatments and interventions, ordering and review of laboratory studies, ordering and review of radiographic studies, pulse oximetry and re-evaluation of patient's condition.

## 2023-02-09 NOTE — Progress Notes (Addendum)
NAME:  [name]., MRN:  [id number], DOB:  1936-01-03, LOS: 6 ADMISSION DATE:  02/02/2023, CONSULTATION DATE:  02/04/23 REFERRING MD:  Sunnie Nielsen CHIEF COMPLAINT:  Dyspnea   History of Present Illness:  [name]. is a 88 y.o. male who has an extensive PMH as below, significant for CAD, CVA, aortic valve replacement 2012, hypertension, pulmonary hypertension, atrial fibrillation and small bowel obstruction  . He presented to [facility] ED 1/25 with dyspnea that woke him from his sleep the night prior. He had also complained of LE edema over the preceding few weeks. On EMS arrival, he had sats of 85% on room air. He was placed on [place] O2 and given Solumedrol and DuoNebs in route to ED.  In ED, CXR was suggestive of mild edema. He was admitted by [facility] and started on diuresis. He was seen by cardiology in consultation who agreed with diuresis for fluid overload and obtaining an echo. Echo was obtained and demonstrated EF 60-65%, mod LVH, RV pressure overload with RVSP 61, LA and RA severely dilated.  On evening of 1/26, he developed nausea and vomiting with probable aspiration event. There was concern for SBO and was seen by general surgery who recommended continuing NPO and continuing NGT to suction.  On 1/27, diuresis was held due to bump in renal function after IV lasix. He was only net negative since admit. He did not appear to have excess volume per cardiology notes and appeared improved from CHF standpoint. Dyspnea and hypoxia were felt to more likely be related to aspiration PNA.  General surgery had also seen and he had benign exam and was passing flatus. He unfortunately pulled out his NGT that day.  After pulling out NGT 1/27, he had episode of tachypnea and increased WOB along with spike in temp to 102.29F. Due to increased WOB, PCCM asked to see in consultation. He had been on 4L [place] prior to this episode. Blood cultures from admission also returned positive for Enterobacter  cloacae. He was on Cefepime but after discussion with TRH, escalated to Meropenem.  Family report concerns of pt aspirating for over a week prior to admit. Further GOC clarified with pt's son, [name], changed to DNR/ DNI.   Pertinent  Medical History:  has Essential hypertension; Gout; History of small bowel obstruction; History of BPH; S/P AVR; Hypokalemia; Left hemiparesis (HCC); Dysphagia, post-stroke; S/P AVR (aortic valve replacement); Benign prostatic hyperplasia with lower urinary tract symptoms; History of ischemic right MCA stroke; Permanent atrial fibrillation (HCC); Hyperglycemia; Mitral valve regurgitation; Tricuspid regurgitation; s/p mitral valve repair; S/P tricuspid valve repair; S/P Maze operation for atrial fibrillation; Long term current use of anticoagulant; Anemia; Anemia due to chronic blood loss; Acquired dilation of ascending aorta and aortic root (HCC); Chronic congestive heart failure (HCC); Elevated PSA; History of adenomatous polyp of colon; History of anemia; Hypercoagulable state (HCC); Impairment of balance; Peripheral neuropathy; Personal history of other malignant neoplasm of skin; Prediabetes; Presence of prosthetic heart valve; Sleep disorder; Thrombocytopenia (HCC); Vitamin D deficiency; Hypertrophy of prostate with urinary obstruction and other lower urinary tract symptoms (LUTS); Closed left hip fracture, initial encounter (HCC); Malnutrition of moderate degree; S/P left total hip arthroplasty; CAD (coronary artery disease); Cerebrovascular accident (CVA) (HCC); SIRS (systemic inflammatory response syndrome) (HCC); Generalized weakness; Otitis externa; Valvular heart disease; Hypoalbuminemia due to protein-calorie malnutrition (HCC); Abnormal findings on diagnostic imaging of other specified body structures; S/P total knee arthroplasty, left; Symptomatic anemia; HLD (hyperlipidemia); Hematuria; Weakness; Urinary catheter  in place; COVID-19 virus infection; Other secondary  pulmonary hypertension (HCC); Preoperative cardiovascular examination; Gait abnormality; Bilateral foot-drop; S/P total knee arthroplasty, right; Acute respiratory failure with hypoxia (HCC); Elevated troponin; Volume overload; Aspiration pneumonia (HCC); DNR (do not resuscitate); Ureteral calculus, right; Nephrolithiasis; Sepsis due to Enterobacter [facility]); and Renal cyst on their problem list.  Significant Hospital Events: Including procedures, antibiotic start and stop dates in addition to other pertinent events   1/25 admit 1/27 pulled out NGT and had episode of respiratory distress along with spike in temp to 102.7F. PCCM consulted. 1/27 Blood cultures returned positive for Enterobacter cloacae. 1/27 worsening shock, started on NE with escalating doses, comfort meds placed 1/28 much improved, more awake and interactive, NE down to 9. CVP 6, coox 85..  AKI peak of 2.77 mg percent 1/28 discussion with pt and family regarding eating. Had failed SLP eval but pt adamant to eat. Discussed risks/benefits and agreed to let pt drink liquids with supervision, if does well then can move to regular diet 1/29 HFNC 4L, tolerating ensure, weaning NE.  Urology consulted for renal CT showing mild right hydronephrosis with 12mm right proximal ureteral stone, hemorrhage 02/08/23: Off vasopressors since yesterday. Improving renal function. Hgb down to 6.2, plts 23 overnight, transfused PRBC and plts. Currently no complaints, has multiple concerns about his nutrition/ diet.  Developed left upper extremity hematoma 20-25 cm.  Received blood products.  -Speech evaluation was to continue current plan of care thin liquid able to self feed slowly small sips and bites sitting upright  Interim History / Subjective:    02/09/23: Remains DNR/DNI.  Repeat blood culture from 02/06/2023 showing positive GPC in 1 bottle aerobic BC ID MRSE considered contaminant.  Cefepime being continued.  Repeat assessment by speech today for full  liquid diet.  Afebrile since 02/05/2023.  White count improving.  Creatinine 1.42 mg percent and improving sodium 158 and overall +1.6 L since admission.  Not on pressors on room air oxygen.  -Also last seen normal at 10:30 AM and then abruptly around 1245 son walked in and found patient has new onset left lower extremity weakness and slight weakness in the left upper extremity as well.  But is alert and oriented.  Code stroke has been called.  Patient is getting stat CT.  Sodium is 158.  Of note last MRI brain was in November 2023 with chronic right-sided MCA infarct  Objective:  Blood pressure (!) 188/75, pulse 75, temperature 98.4 F (36.9 C), temperature source Oral, resp. rate (!) 28, height 6' (1.829 m), weight 77.7 kg, SpO2 95%. CVP:  [4 mmHg-15 mmHg] 7 mmHg  FiO2 (%):  [28 %] 28 %   Intake/Output Summary (Last 24 hours) at 02/09/2023 1244 Last data filed at 02/09/2023 1000 Gross per 24 hour  Intake 1092.5 ml  Output 2300 ml  Net -1207.5 ml   Filed Weights   02/07/23 0500 02/08/23 0500 02/09/23 0500  Weight: 73.8 kg 79.6 kg 77.7 kg    Examination:  General Appearance:  Looks deconditiond but sstable. No distress Head:  Normocephalic, without obvious abnormality, atraumatic Eyes:  PERRL - yes, conjunctiva/corneas - muddy     Ears:  Normal external ear canals, both ears Nose:  G tube - no Throat:  ETT TUBE - no , OG - NO Neck:  Supple,  No enlargement/tenderness/nodules Lungs: Clear to auscultation bilaterally, Heart:  S1 and S2 normal, no murmur, CVP - no.  Pressors - n Abdomen:  Soft, no masses, no organomegaly Genitalia /  Rectal:  Not done Extremities:  Extremities- LUE in bandage Skin:  ntact in exposed areas . Sacral area - not examined Neurologic:  Sedation - none -> RASS - +1 . Moves all 4s - yes but LLE is  4+/5 and LLE is 4- / 5. Marland Kitchen CAM-ICU - neg . Orientation - seems oriented. Followed commands. Said word HUSH well    Assessment & Plan:  New acute onset of left  sided weakness with domination of the weakness in the left lower extremity [history of right MCA infarct in 2023 November on MRI] -discovered around 12:45 PM  Plan   - Code stroke called - Stat CT head -MRI -Correct sodium [see electrolyte section]   Acute hypoxic respiratory failure - multifactorial in setting aspiration PNA (?chronic), fluid overload/CHF exacerbation, atelectasis, dysphagia - Doubt much CHF/fluid in play 1/29, CVP and co-ox both reassuring. - Likely chronic component of aspiration/ dysphagia, with prior MBS 2018 showing proximal esophageal dysfunction -> repeat swallow eval before code stroke in 02/09/2023: Clear to eat oral diet  02/09/23: Off oxygen   P:  - - aspiration precautions - cont BD, pulm hygiene, IS, HTS, CPT    Enterobacter cloacae bacteremia - 2/2 UTI and right ureteral stone with mild hydronephrosis Septic shock - 2/2 above and aspiration PNA, resolved - 1/30 was not a candidate for perc nephrostomy tube given elevated INR and thrombocytopenia.  S/p cystoscopy with right ureteral stent placement 1/30  02/09/2023: Continues to be off pressors.  Is afebrile.  Possible contaminant on recent blood culture.  02/06/2023  P:  - cefepime day 7x [need to set stop date] - chagne to merrem due to enceophalopathy (neuro prefers) - trend WBC/ fever curve    Left iliopsoas intramuscular bleed Right renal cyst with hemorrhage  LUE hematoma -discovered 11/31/25.  Associated with blood pressure cuff.  And status post blood products  2/1/125 - no consequence to renal but has new stroke after blood proeucts yesterday. No active bleed. Holding Hgb  Plan  - - PRBC for hgb </= 6.9gm%    - exceptions are   -  if ACS susepcted/confirmed then transfuse for hgb </= 8.0gm%,  or    -  active bleeding with hemodynamic instability, then transfuse regardless of hemoglobin value   At at all times try to transfuse 1 unit prbc as possible with exception of active  hemorrhage   - SCDs for now   Ileus and less likely SBO - had NGT but pt removed this 1/27, passing flatus - reglan x 3 days started 1/28 - continues to have more liquid BM.     PLAN  - Continue to monitor, prn bowel regimen - might need NPO following code stroke -a wait stroke MD opinion  AKI  (baseline creat 0.9mg % as of Aug 2024 ) - onset 02/03/23 with creat 1.47mg % - - worsened after IV diuresis, shock state, mild R hydronephrosis with right renal calculi - [place] creat 01/26/23 - 2.77mg % - -s/p stent placement 1/30, appreciate urology assistance  2/1 - improved creat 1.42mg %  Plan - cont foley per urology - Trend BMP / urinary output/ strict I/Os - Replace electrolytes as indicated - Avoid nephrotoxic agents, ensure adequate renal perfusion  Hypernatremia - Onset 149 on 02/06/23   02/09/23 worse 158  Plan  - start D5W at 100cc/h   Afib - on coumadin Valvular disease s/p bioprosthetic AVR 2012, w/ MV and TV repair  Pulmonary HTN- mixed group II and III CAD, non-obstructive  HFpEF -  cardioogy signed off 02/06/23  2/1 - stable  PLAN - HR remains controlled, tele monitoring - optimize electrolytes - cont to hold pta BB until Hgb stabilizes - cont to hold pta coumadin - goal is euvolemic    Anemia of chronic disease Acute on chronic Thrombocytopenia Coagulapathy on coumadin   2/1 -INR 2.1 . No actibe bleed  PLAN - transfuse for Hgb < 7 or significant bleeding - cont to hold coumadin.  INR 2.7   Protein calorie malnutrition - maximize diet, will get RD consult as pt has many questions   Hyperglycemia, likely stress response - difference b/t FBG and serum.  Stopping D5 which will help.  Getting RD input as well  - add SSI prn, CBG q 4, goal 140-180   Goals of care. - changed back to DNR/ DNI 1/30, vasopressor support ok   Best practice (evaluated daily):  Code Status:  DNR/ DNI DVT prophylaxis: SCD's.  Diet: per SLP -. Oral diet 21/1/25 per SLP =>  but code stroke after that -> so NPO Activity: bedrest for now, PT when able Lines: PICC, maintain foley Disposition : ICU for now    Family - upated son at bedside   ATTESTATION & SIGNATURE   The patient Kyri Dai. is critically ill with multiple organ systems failure and requires high complexity decision making for assessment and support, frequent evaluation and titration of therapies, application of advanced monitoring technologies and extensive interpretation of multiple databases and discussion with other appropriate health care personnel such as bedside nurses, social workers, case Production designer, theatre/television/film, consultants, respiratory therapists, nutritionists, secretaries etc.,  Critical care time includes but is not restricted to just documentation time. Documentation can happen in parallel or sequential to care time depending on case mix urgency and priorities for the shift. So, overall critical Care Time devoted to patient care services described in this note is  30  Minutes.   This time reflects time of care of this signee Dr Kalman Shan which includ does not reflect procedure time, or teaching time or supervisory time of PA/NP/Med student/Med Resident etc but could involve care discussion time     Dr. Kalman Shan, M.D., [facility]C.P Pulmonary and Critical Care Medicine Staff Physician, [facility] [place] [facility] Pager: [phone], If no answer or between  15:00h - 7:00h: call [phone]  02/09/2023 12:44 PM    LABS    PULMONARY Recent Labs  Lab 02/05/23 1554 02/07/23 1150  PHART  --  7.49*  PCO2ART  --  32  PO2ART  --  330*  HCO3  --  24.4  O2SAT 84.6 99    CBC Recent Labs  Lab 02/08/23 0620 02/08/23 1831 02/09/23 0443  HGB 6.2* 7.3* 7.6*  HCT 20.3* 23.2* 23.8*  WBC 12.3* 11.2* 13.1*  PLT 23* 41* 43*    COAGULATION Recent Labs  Lab 02/06/23 0700 02/07/23 0527 02/08/23 0620 02/08/23 1831 02/09/23 0443  INR 2.9*  2.8* 2.8* 2.2* 2.1*    CARDIAC  No results for input(s): "TROPONINI" in the last 168 hours. No results for input(s): "PROBNP" in the last 168 hours.   CHEMISTRY Recent Labs  Lab 02/03/23 0402 02/03/23 2348 02/06/23 0700 02/07/23 0527 02/07/23 1717 02/08/23 0620 02/09/23 0443 02/09/23 0702  NA 140   < > 149* 156* 154* 145 158* 158*  K 4.7   < > 3.8 4.2 4.9 3.6 4.4 4.2  CL 107   < > 114* 123*  123* 113* 124* 123*  CO2 21*   < > 22 21* 22 23 26 26   GLUCOSE 184*   < > 297* 155* 314* 484* 155* 193*  BUN 40*   < > 95* 99* 98* 80* 65* 66*  CREATININE 1.47*   < > 2.64* 2.33* 2.33* 1.83* 1.46* 1.42*  CALCIUM 8.7*   < > 8.4* 8.9 8.2* 7.7* 8.7* 8.8*  MG 2.3  --  2.9* 2.8*  --  2.3 2.2  --   PHOS  --   --  2.8 1.5* 3.3 3.1 3.7  --    < > = values in this interval not displayed.   Estimated Creatinine Clearance: 41.7 mL/min (A) (by C-G formula based on SCr of 1.42 mg/dL (H)).   LIVER Recent Labs  Lab 02/03/23 0402 02/03/23 2348 02/04/23 0741 02/06/23 0700 02/07/23 0527 02/07/23 1717 02/08/23 0620 02/08/23 1831 02/09/23 0443  AST 27 56*  --   --   --   --  48*  --   --   ALT 16 23  --   --   --   --  33  --   --   ALKPHOS 40 52  --   --   --   --  106  --   --   BILITOT 1.4* 2.1*  --   --   --   --  1.2  --   --   PROT 6.9 7.2  --   --   --   --  5.2*  --   --   ALBUMIN 3.4* 3.4*  --   --   --  2.2* 2.1*  --   --   INR 2.5* 2.5*   < > 2.9* 2.8*  --  2.8* 2.2* 2.1*   < > = values in this interval not displayed.     INFECTIOUS Recent Labs  Lab 02/03/23 0803 02/03/23 1955 02/03/23 2348  LATICACIDVEN  --  3.1* 3.2*  PROCALCITON 42.01  --   --      ENDOCRINE CBG (last 3)  Recent Labs    02/08/23 2355 02/09/23 0326 02/09/23 0755  GLUCAP 132* 136* 159*         IMAGING x48h  - image(s) personally visualized  -   highlighted in bold No results found.

## 2023-02-09 NOTE — Progress Notes (Signed)
Modified Barium Swallow Study  Patient Details  Name: [name]. MRN: [id number] Date of Birth: 03/12/1935  [name] Date: 02/09/2023  Modified Barium Swallow completed.  Full report located under Chart Review in the Imaging Section.  History of Present Illness 88yo male admitted 02/02/23 with SOB 2/2 CHF exacerbation with possible aspiration event. PMH: acquired dilation of the ascending aorta and aortic root, CAD, AFib, Right basal ganglia CVA (2018), HTN, heart murmur, AVR, OA, BPH, SBO, neuropathy, thrombocytopenia.  PMH+ for dysphagia from CVA 2018 requiring thickener and another CVA that did not impair his swallowing. Pt developed some respiratory difficulties this am - and was placed on 4 L HFNC - s/p surgery with urology stent and then sent from surgery to ICU on vasopressors and Bipap 1/30. Subsequently taken off Bipap and awoke adequately for po.   Clinical Impression Pt presents with a moderate oropharyngeal dysphagia characterized by reduced strength and coordination. Participation was significantly limited by fatigue, mentation, and hesistance to potential gagging. He declined to use straws or take sequential sips, so thin liquids were assessed through single cup sips. Due to incomplete epiglottic inversion and partial laryngeal elevation, thin liquids consistently result in penetration above the level of the vocal folds that does not spontaneously clear (PAS 3). He initiates effective throat clearance as it progresses, indicating intact sensation. After multiple subswallows, penetrates are eventually expelled. There is diffuse pharyngeal residue that increases with thickened liquid consistencies and especially solids. Chin tuck and L head turn postures are ineffective in preventing this penetration or reducing residue. A liquid wash does clear vallecular residue, although any liquid results in a collection of residue in pt's pyriform sinuses. Pt states preference to continue full  liquid diet at present but feel that as he is willing, diet could be advanced to modified texture solids. No significant esophageal retention or backflow was noted, although suspected cervical osteophytes contribute to reduced distention at the level of the PES. Recommend continuing full liquid diet as that is with what pt feels most comfortable. He may benefit from ongoing education and intervention to maintain his strength and coordination. SLP will continue following. Factors that may increase risk of adverse event in presence of aspiration Rubye Oaks [employer] Clearance Coots 2021): Poor general health and/or compromised immunity;Reduced cognitive function;Frail or deconditioned;Dependence for feeding and/or oral hygiene;Inadequate oral hygiene;Reduced saliva  Swallow Evaluation Recommendations Recommendations: PO diet PO Diet Recommendation: Full liquid diet Liquid Administration via: Spoon;Cup Medication Administration: Whole meds with puree (or continue with Ensure) Supervision: Full assist for feeding;Full supervision/cueing for swallowing strategies Swallowing strategies  : Minimize environmental distractions;Slow rate;Small bites/sips;Multiple dry swallows after each bite/sip;Follow solids with liquids Postural changes: Position pt fully upright for meals;Stay upright 30-60 min after meals Oral care recommendations: Oral care BID (2x/day)      Gwynneth Aliment, M.A., CF-SLP Speech Language Pathology, Acute Rehabilitation Services  Secure Chat preferred [phone]  02/09/2023,12:09 PM

## 2023-02-09 NOTE — Progress Notes (Signed)
PHARMACY - PHYSICIAN COMMUNICATION CRITICAL VALUE ALERT - BLOOD CULTURE IDENTIFICATION (BCID)  [name]. is an 88 y.o. male who presented to [facility] on 02/02/2023 with a chief complaint of shortness of breath.   Assessment:   Enterobacter cloacae urosepsis.  Patient is clinically improving per notes.  Repeat blood cx from 1/29 now +GPC in aerobic bottle of 1 set only; BCID + methicillin resistant staph epi.  This is possibly contaminant.   Name of physician (or Provider) Contacted: Ramaswamy  Current antibiotics: Cefepime  Changes to prescribed antibiotics recommended:  Continue Cefepime.   Results for orders placed or performed during the hospital encounter of 02/02/23  Blood Culture ID Panel (Reflexed) (Collected: 02/06/2023  6:18 PM)  Result Value Ref Range   Enterococcus faecalis NOT DETECTED NOT DETECTED   Enterococcus Faecium NOT DETECTED NOT DETECTED   Listeria monocytogenes NOT DETECTED NOT DETECTED   Staphylococcus species DETECTED (A) NOT DETECTED   Staphylococcus aureus (BCID) NOT DETECTED NOT DETECTED   Staphylococcus epidermidis DETECTED (A) NOT DETECTED   Staphylococcus lugdunensis NOT DETECTED NOT DETECTED   Streptococcus species NOT DETECTED NOT DETECTED   Streptococcus agalactiae NOT DETECTED NOT DETECTED   Streptococcus pneumoniae NOT DETECTED NOT DETECTED   Streptococcus pyogenes NOT DETECTED NOT DETECTED   A.calcoaceticus-baumannii NOT DETECTED NOT DETECTED   Bacteroides fragilis NOT DETECTED NOT DETECTED   Enterobacterales NOT DETECTED NOT DETECTED   Enterobacter cloacae complex NOT DETECTED NOT DETECTED   Escherichia coli NOT DETECTED NOT DETECTED   Klebsiella aerogenes NOT DETECTED NOT DETECTED   Klebsiella oxytoca NOT DETECTED NOT DETECTED   Klebsiella pneumoniae NOT DETECTED NOT DETECTED   Proteus species NOT DETECTED NOT DETECTED   Salmonella species NOT DETECTED NOT DETECTED   Serratia marcescens NOT DETECTED NOT DETECTED   Haemophilus  influenzae NOT DETECTED NOT DETECTED   Neisseria meningitidis NOT DETECTED NOT DETECTED   Pseudomonas aeruginosa NOT DETECTED NOT DETECTED   Stenotrophomonas maltophilia NOT DETECTED NOT DETECTED   Candida albicans NOT DETECTED NOT DETECTED   Candida auris NOT DETECTED NOT DETECTED   Candida glabrata NOT DETECTED NOT DETECTED   Candida krusei NOT DETECTED NOT DETECTED   Candida parapsilosis NOT DETECTED NOT DETECTED   Candida tropicalis NOT DETECTED NOT DETECTED   Cryptococcus neoformans/gattii NOT DETECTED NOT DETECTED   Methicillin resistance mecA/C DETECTED (A) NOT DETECTED    Junita Push PharmD 02/09/2023  6:35 AM

## 2023-02-09 NOTE — Significant Event (Signed)
Called to bedside for concern of new facial droop, left sided weakness and altered speech. Per bedside RN, patient last known well was at 1030 AM today and recently arrived back from barium swallow. Son at bedside and also informing staff that left facial droop is not his normal (EPIC picture appears to have left droop). Per son, his left leg is usually able to move and lift off bed. CBG 157. Sodium is 158, but per son these changes are acute and bedside RN reports symptoms being new. See flowsheet for [facility] completed by bedside RN   Ramaswamy MD at bedside and evaluated patient. CODE STROKE activated per Rapid Response protocol-Tele neuro cart activated. Patient transferred to and from CT without any issues.    Rosaria Ferries, RN

## 2023-02-09 NOTE — Progress Notes (Signed)
Pharmacy Antibiotic Note  Crispin Vogel. is a 88 y.o. male admitted on 02/02/2023 with  Enterobacter cloacae bacteremia .  Antibiotics narrowed from meropenem to cefepime based on sensitivities. Pharmacy consulted to dose cefepime.   Today, 02/09/23 WBC elevated and increased AKI, monitor SCr daily which is trending down  Plan: Continue cefepime 2 g IV q12h Continue to follow renal function for dose adjustments  Height: 6' (182.9 cm) Weight: 77.7 kg (171 lb 4.8 oz) IBW/kg (Calculated) : 77.6  Temp (24hrs), Avg:98.3 F (36.8 C), Min:97.9 F (36.6 C), Max:98.9 F (37.2 C)  Recent Labs  Lab 02/03/23 1955 02/03/23 2348 02/04/23 0814 02/07/23 0527 02/07/23 1455 02/07/23 1717 02/07/23 2141 02/08/23 0620 02/08/23 1831 02/09/23 0443 02/09/23 0702  WBC  --  23.1*   < > 5.5 20.8*  --  12.7* 12.3* 11.2* 13.1*  --   CREATININE  --  2.51*   < > 2.33*  --  2.33*  --  1.83*  --  1.46* 1.42*  LATICACIDVEN 3.1* 3.2*  --   --   --   --   --   --   --   --   --    < > = values in this interval not displayed.    Estimated Creatinine Clearance: 41.7 mL/min (A) (by C-G formula based on SCr of 1.42 mg/dL (H)).    Allergies  Allergen Reactions   Sulfa Antibiotics Other (See Comments)    Granulocytosis   Sulfamethoxazole-Trimethoprim     Other Reaction(s): Unknown   Zestril [Lisinopril] Cough    Microbiology results: 1/26 BCx: Enterobacter cloacae complex (pan-S) 1/26 UCx: >100K Enterobacter species (pan-S) 1/29 repeat BCx: +GPC in aerobic bottle of 1 set only; BCID + methicillin resistant staph epi. Likely contaminant.  1/30 UCx: no growth  Lynden Ang, PharmD, BCPS 02/09/2023 8:14 AM

## 2023-02-09 NOTE — Progress Notes (Signed)
IP Code Stroke cart activated @1251 . Dr. Marchelle Gearing at bedside for evaluation. LKWT 1030 before procedure. Family reports changes to speech pattern On exam, pt exhibits left-sided weakness and facial droop with some probable mixed aphasia. CBG 157. Pt transported to CT @1256 .  Dr. Iver Nestle paged @1255  and on camera for evaluation @1300 . NIH 13. Platelet count 43.  Pt is not a TNK candidate d/t thrombocytopenia. No advanced imaging at this time. TSRN disconnected from cart @1315 . 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

## 2023-02-09 NOTE — Progress Notes (Signed)
This RN noted pt with left side facial droop.  Son at bedside stated his speech had changed.   LNK 1030 prior to pt off floor for modified barium swallow study. Initial NIH completed.  RN alerted Rapid Response Nurse who initiated Code Stroke.  See RRRN note.  Ulis Rias, RN 02/09/2023 2:52 PM

## 2023-02-09 NOTE — Plan of Care (Signed)
  Problem: Education: Goal: Knowledge of General Education information will improve Description: Including pain rating scale, medication(s)/side effects and non-pharmacologic comfort measures Outcome: Progressing   Problem: Clinical Measurements: Goal: Ability to maintain clinical measurements within normal limits will improve Outcome: Progressing Goal: Respiratory complications will improve Outcome: Progressing Goal: Cardiovascular complication will be avoided Outcome: Progressing   Problem: Nutrition: Goal: Adequate nutrition will be maintained Outcome: Progressing   Problem: Coping: Goal: Level of anxiety will decrease Outcome: Progressing   Problem: Elimination: Goal: Will not experience complications related to bowel motility Outcome: Progressing   Problem: Pain Managment: Goal: General experience of comfort will improve and/or be controlled Outcome: Not Progressing   Problem: Skin Integrity: Goal: Risk for impaired skin integrity will decrease Outcome: Not Progressing

## 2023-02-09 NOTE — Progress Notes (Addendum)
   Call from Dr Iver Nestle of stroke-neuro   CT/MRI shows L MCA stroke  x 2 small x embolic -acute. FFP/Platelets probably played a role   Plan  - supportive care  - avoid FFP/Platelets to extent possibl;e  - ok to continue holding coumadin - INR goal 2-3 - avoid bringing down INR actively - ok to consider ASA 325mg  bridge to coumadin if needed esp if INR < 2 and if ccm feels cannot start coumadin - max hold AC from stroke perspectie is 1-2 days - can go back on coumadin  - no role for dual anticoagulation   -Discussed with Dr. Robb Matar and tried primary tomorrow and CCM off  SIGNATURE    Dr. Kalman Shan, M.D., F.C.C.P,  Pulmonary and Critical Care Medicine Staff Physician, [facility] Director - Interstitial Lung Disease  Program  Pulmonary Fibrosis [facility] [place], [place], [address]   Pager: [phone], If no answer  -> Check AMION or Try [phone] Telephone (clinical office): [phone] Telephone (research): [phone]  4:18 PM 02/09/2023

## 2023-02-09 NOTE — Progress Notes (Signed)
PT in procedure- scheduled nebulizer not delivered at this time.

## 2023-02-10 ENCOUNTER — Inpatient Hospital Stay (HOSPITAL_COMMUNITY): Payer: Medicare Other

## 2023-02-10 DIAGNOSIS — J9601 Acute respiratory failure with hypoxia: Secondary | ICD-10-CM | POA: Diagnosis not present

## 2023-02-10 DIAGNOSIS — I639 Cerebral infarction, unspecified: Secondary | ICD-10-CM

## 2023-02-10 LAB — CBC
HCT: 22.6 % — ABNORMAL LOW (ref 39.0–52.0)
Hemoglobin: 7.2 g/dL — ABNORMAL LOW (ref 13.0–17.0)
MCH: 30.1 pg (ref 26.0–34.0)
MCHC: 31.9 g/dL (ref 30.0–36.0)
MCV: 94.6 fL (ref 80.0–100.0)
Platelets: 57 10*3/uL — ABNORMAL LOW (ref 150–400)
RBC: 2.39 MIL/uL — ABNORMAL LOW (ref 4.22–5.81)
RDW: 17.5 % — ABNORMAL HIGH (ref 11.5–15.5)
WBC: 15.2 10*3/uL — ABNORMAL HIGH (ref 4.0–10.5)
nRBC: 0 % (ref 0.0–0.2)

## 2023-02-10 LAB — POCT I-STAT, CHEM 8
BUN: 84 mg/dL — ABNORMAL HIGH (ref 8–23)
Calcium, Ion: 1.24 mmol/L (ref 1.15–1.40)
Chloride: 123 mmol/L — ABNORMAL HIGH (ref 98–111)
Creatinine, Ser: 2.5 mg/dL — ABNORMAL HIGH (ref 0.61–1.24)
Glucose, Bld: 113 mg/dL — ABNORMAL HIGH (ref 70–99)
HCT: 27 % — ABNORMAL LOW (ref 39.0–52.0)
Hemoglobin: 9.2 g/dL — ABNORMAL LOW (ref 13.0–17.0)
Potassium: 3.7 mmol/L (ref 3.5–5.1)
Sodium: 161 mmol/L (ref 135–145)
TCO2: 22 mmol/L (ref 22–32)

## 2023-02-10 LAB — GLUCOSE, CAPILLARY
Glucose-Capillary: 125 mg/dL — ABNORMAL HIGH (ref 70–99)
Glucose-Capillary: 146 mg/dL — ABNORMAL HIGH (ref 70–99)
Glucose-Capillary: 127 mg/dL — ABNORMAL HIGH (ref 70–99)
Glucose-Capillary: 148 mg/dL — ABNORMAL HIGH (ref 70–99)
Glucose-Capillary: 137 mg/dL — ABNORMAL HIGH (ref 70–99)

## 2023-02-10 LAB — BASIC METABOLIC PANEL
Anion gap: 8 (ref 5–15)
BUN: 53 mg/dL — ABNORMAL HIGH (ref 8–23)
CO2: 25 mmol/L (ref 22–32)
Calcium: 8.9 mg/dL (ref 8.9–10.3)
Chloride: 124 mmol/L — ABNORMAL HIGH (ref 98–111)
Creatinine, Ser: 1.31 mg/dL — ABNORMAL HIGH (ref 0.61–1.24)
GFR, Estimated: 53 mL/min — ABNORMAL LOW (ref >60)
Glucose, Bld: 175 mg/dL — ABNORMAL HIGH (ref 70–99)
Potassium: 3.6 mmol/L (ref 3.5–5.1)
Sodium: 157 mmol/L — ABNORMAL HIGH (ref 135–145)

## 2023-02-10 LAB — POCT I-STAT 7, (LYTES, BLD GAS, ICA,H+H)
Acid-base deficit: 2 mmol/L (ref 0.0–2.0)
Bicarbonate: 23.1 mmol/L (ref 20.0–28.0)
Calcium, Ion: 1.24 mmol/L (ref 1.15–1.40)
HCT: 23 % — ABNORMAL LOW (ref 39.0–52.0)
Hemoglobin: 7.8 g/dL — ABNORMAL LOW (ref 13.0–17.0)
O2 Saturation: 97 %
Potassium: 3.7 mmol/L (ref 3.5–5.1)
Sodium: 162 mmol/L (ref 135–145)
TCO2: 24 mmol/L (ref 22–32)
pCO2 arterial: 37.8 mm[Hg] (ref 32–48)
pH, Arterial: 7.394 (ref 7.35–7.45)
pO2, Arterial: 93 mm[Hg] (ref 83–108)

## 2023-02-10 LAB — LIPID PANEL
Cholesterol: 53 mg/dL (ref 0–200)
HDL: 11 mg/dL — ABNORMAL LOW (ref >40)
LDL Cholesterol: 23 mg/dL (ref 0–99)
Total CHOL/HDL Ratio: 4.8 {ratio}
Triglycerides: 93 mg/dL (ref <150)
VLDL: 19 mg/dL (ref 0–40)

## 2023-02-10 LAB — CULTURE, BLOOD (ROUTINE X 2): Special Requests: ADEQUATE

## 2023-02-10 LAB — PROTIME-INR
INR: 1.8 — ABNORMAL HIGH (ref 0.8–1.2)
Prothrombin Time: 21.5 s — ABNORMAL HIGH (ref 11.4–15.2)

## 2023-02-10 LAB — TROPONIN I (HIGH SENSITIVITY): Troponin I (High Sensitivity): 49 ng/L — ABNORMAL HIGH (ref <18)

## 2023-02-10 LAB — MAGNESIUM: Magnesium: 2.2 mg/dL (ref 1.7–2.4)

## 2023-02-10 LAB — PHOSPHORUS: Phosphorus: 3 mg/dL (ref 2.5–4.6)

## 2023-02-10 LAB — BRAIN NATRIURETIC PEPTIDE: B Natriuretic Peptide: 349.4 pg/mL — ABNORMAL HIGH (ref 0.0–100.0)

## 2023-02-10 NOTE — Evaluation (Addendum)
Physical Therapy Evaluation Patient Details Name: [name]. MRN: [id number] DOB: [birth year] [name] Date: 02/10/2023  History of Present Illness  Patient is a 88 year old male who presented on 1/25 with dyspnea and hypoxia. Patient was admited with concerns of SBO. 1/27 patient pulled out NG tube with increased work of breath and fever. 1/29 patient on HFNC with CT revealing mild right hydronephrosis with 12 mm right proximal ureteral stone.1/31 patient was taken off vasopressors and received PRBCs. 2/1 patient found to have left side weakness with code stroke called. MRI revealed L MCA. PMH: R TKR, L THR, L TKA,h/o stroke, peripheral neuropathy, gout, HTN.  Clinical Impression    Patient is a 88 year old male who was admitted for above. Patient was living at home with spouse and able to perform basic mobility tasks at MOD I level.  Currently, patient is +2 to advance to EOB with increased time. Patient was noted to have decreased functional activity tolerance, decreased ROM, significant generalized weakness, balance deficits, and decreased safety awareness. Patient will benefit from continued inpatient follow up therapy, <3 hours/day. Patient would continue to benefit from skilled PT services at this time while admitted and after d/c to address noted deficits in order to improve overall safety and independence.      If plan is discharge home, recommend the following: Two people to help with walking and/or transfers;A lot of help with bathing/dressing/bathroom;Assistance with cooking/housework;Assist for transportation;Help with stairs or ramp for entrance   Can travel by private vehicle   No    Equipment Recommendations None recommended by PT  Recommendations for Other Services       Functional Status Assessment Patient has had a recent decline in their functional status and demonstrates the ability to make significant improvements in function in a reasonable and predictable amount  of time.     Precautions / Restrictions Precautions Precautions: Fall Restrictions Weight Bearing Restrictions Per Provider Order: No      Mobility  Bed Mobility Overal bed mobility: Needs Assistance Bed Mobility: Supine to Sit, Sit to Supine     Supine to sit: Mod assist, +2 for physical assistance, +2 for safety/equipment Sit to supine: Mod assist, +2 for physical assistance, +2 for safety/equipment   General bed mobility comments: with increased time and cues for proper hand and foot movements.    Transfers                   General transfer comment: NT 2* pt fatigue    Ambulation/Gait                  Stairs            Wheelchair Mobility     Tilt Bed    Modified Rankin (Stroke Patients Only)       Balance Overall balance assessment: Needs assistance Sitting-balance support: Bilateral upper extremity supported, Feet supported Sitting balance-Leahy Scale: Fair         Standing balance comment: unable to attempt                             Pertinent Vitals/Pain Pain Assessment Pain Assessment: No/denies pain    Home Living Family/patient expects to be discharged to:: Private residence Living Arrangements: Spouse/significant other Available Help at Discharge: Family;Available 24 hours/day Type of Home: Apartment Home Access: Level entry         Home Equipment: Rolling Walker (2 wheels);Cane -  single point;Shower seat;BSC/3in1;Grab bars - tub/shower Additional Comments: Pt lives at [facility] community, info was taken from chart with patient unable to report PLOF    Prior Function Prior Level of Function : Independent/Modified Independent;Driving             Mobility Comments: Pt reports using RW or cane for mobility prior to admit       Extremity/Trunk Assessment   Upper Extremity Assessment Upper Extremity Assessment: Defer to OT evaluation LUE Deficits / Details: noted to have cuts  on posterior hand with large dressing around elbow area, patient having hard time holding onto STEDY with set up to stand with hand tremors at rest and with movement. patient able to grip minimally bilaterally on this date. needed hand over hand to keep arm up in FF    Lower Extremity Assessment Lower Extremity Assessment: Generalized weakness;Difficult to assess due to impaired cognition    Cervical / Trunk Assessment Cervical / Trunk Assessment: Kyphotic  Communication   Communication Communication: No apparent difficulties  Cognition Arousal: Lethargic Behavior During Therapy: Flat affect Overall Cognitive Status: Difficult to assess                                 General Comments: patient slow to respond to questions, able to state name clearly,        General Comments General comments (skin integrity, edema, etc.): blood pressure supine in bed 175/72 mmhg sitting EOB BP was 198/96 mmhg    Exercises     Assessment/Plan    PT Assessment Patient needs continued PT services  PT Problem List Decreased strength;Decreased activity tolerance;Decreased balance;Decreased mobility;Decreased cognition;Decreased knowledge of use of DME;Decreased safety awareness;Decreased knowledge of precautions       PT Treatment Interventions DME instruction;Gait training;Functional mobility training;Therapeutic activities;Therapeutic exercise;Balance training;Patient/family education    PT Goals (Current goals can be found in the Care Plan section)  Acute Rehab PT Goals Patient Stated Goal: REgain IND PT Goal Formulation: Patient unable to participate in goal setting Time For Goal Achievement: 02/24/23 Potential to Achieve Goals: Fair    Frequency Min 1X/week     Co-evaluation PT/OT/SLP Co-Evaluation/Treatment: Yes Reason for Co-Treatment: To address functional/ADL transfers PT goals addressed during session: Mobility/safety with mobility OT goals addressed during  session: ADL's and self-care       AM-PAC PT "6 Clicks" Mobility  Outcome Measure Help needed turning from your back to your side while in a flat bed without using bedrails?: A Lot Help needed moving from lying on your back to sitting on the side of a flat bed without using bedrails?: A Lot Help needed moving to and from a bed to a chair (including a wheelchair)?: A Lot Help needed standing up from a chair using your arms (e.g., wheelchair or bedside chair)?: Total Help needed to walk in hospital room?: Total Help needed climbing 3-5 steps with a railing? : Total 6 Click Score: 9    End of Session Equipment Utilized During Treatment: Gait belt Activity Tolerance: Patient limited by fatigue Patient left: in bed;with call bell/phone within reach;with bed alarm set;with nursing/sitter in room Nurse Communication: Mobility status PT Visit Diagnosis: Muscle weakness (generalized) (M62.81);History of falling (Z91.81);Difficulty in walking, not elsewhere classified (R26.2)    Time: 9604-5409 PT Time Calculation (min) (ACUTE ONLY): 24 min   Charges:       PT General Charges $$ ACUTE PT VISIT: 1 Visit PT  Evaluation $PT Eval Moderate Complexity: 1 Mod        Mauro Kaufmann PT Acute Rehabilitation Services Pager [phone] Office [phone]   [name] 02/10/2023, 1:27 PM

## 2023-02-10 NOTE — Plan of Care (Signed)
  Problem: Education: Goal: Knowledge of General Education information will improve Description: Including pain rating scale, medication(s)/side effects and non-pharmacologic comfort measures Outcome: Progressing   Problem: Clinical Measurements: Goal: Respiratory complications will improve Outcome: Progressing   Problem: Elimination: Goal: Will not experience complications related to bowel motility Outcome: Progressing Goal: Will not experience complications related to urinary retention Outcome: Progressing   Problem: Clinical Measurements: Goal: Cardiovascular complication will be avoided Outcome: Not Progressing   Problem: Pain Managment: Goal: General experience of comfort will improve and/or be controlled Outcome: Not Progressing   Problem: Education: Goal: Knowledge of disease or condition will improve Outcome: Not Progressing Goal: Knowledge of patient specific risk factors will improve (DELETE if not current risk factor) Outcome: Not Progressing

## 2023-02-10 NOTE — Progress Notes (Signed)
PROGRESS NOTE    [name].  UJW:[id number] DOB: Aug 11, 1935 DOA: 02/02/2023 PCP: Jarrett Soho, PA-C   Brief Narrative:  [name]. is a 88 y.o. male who has an extensive PMH as below, significant for CAD, CVA, aortic valve replacement 2012, hypertension, pulmonary hypertension, atrial fibrillation and small bowel obstruction  . He presented to [facility] ED 1/25 with dyspnea that woke him from his sleep the night prior. He had also complained of LE edema over the preceding few weeks. On EMS arrival, he had sats of 85% on room air. He was placed on [place] O2 and given Solumedrol and DuoNebs in route to ED.   In ED, CXR was suggestive of mild edema. He was admitted by [facility] and started on diuresis. He was seen by cardiology in consultation who agreed with diuresis for fluid overload and obtaining an echo. Echo was obtained and demonstrated EF 60-65%, mod LVH, RV pressure overload with RVSP 61, LA and RA severely dilated.   On evening of 1/26, he developed nausea and vomiting with probable aspiration event. There was concern for SBO and was seen by general surgery who recommended continuing NPO and continuing NGT to suction.   On 1/27, diuresis was held due to bump in renal function after IV lasix. He was only net negative since admit. He did not appear to have excess volume per cardiology notes and appeared improved from CHF standpoint. Dyspnea and hypoxia were felt to more likely be related to aspiration PNA.  General surgery had also seen and he had benign exam and was passing flatus. He unfortunately pulled out his NGT that day.   After pulling out NGT 1/27, he had episode of tachypnea and increased WOB along with spike in temp to 102.78F. Due to increased WOB, PCCM asked to see in consultation. He had been on 4L [place] prior to this episode.  Blood cultures from admission also returned positive for Enterobacter cloacae. He was on Cefepime escalated to Meropenem.  Family report  concerns of pt aspirating for over a week prior to admit. Further GOC clarified with pt's son, [name], changed to DNR/ DNI.   Significant Hospital Events:   1/27 pulled out NGT and had episode of respiratory distress along with spike in temp to 102.78F. PCCM consulted. 1/27 Blood cultures returned positive for Enterobacter cloacae. 1/27 worsening shock, started on NE with escalating doses, comfort meds placed 1/28 much improved, more awake and interactive, NE down to 9. CVP 6, coox 85..  AKI peak of 2.77 mg percent 1/28 discussion with pt and family regarding eating. Had failed SLP eval but pt adamant to eat. Discussed risks/benefits and agreed to let pt drink liquids with supervision, if does well then can move to regular diet 1/29 HFNC 4L, tolerating ensure, weaning NE.  Urology consulted for renal CT showing mild right hydronephrosis with 12mm right proximal ureteral stone, hemorrhage 02/08/23: Off vasopressors since yesterday. Improving renal function. Hgb down to 6.2, plts 23 overnight, transfused PRBC and plts. Currently no complaints, has multiple concerns about his nutrition/ diet.  Developed left upper extremity hematoma 20-25 cm.  Received blood products.    Assessment & Plan:   Acute ischemic stroke: -Thought to be related to FFP/platelet infusion -New onset left-sided weakness.  Code stroke called.  MRI brain on 2/1 showed small acute left frontal and left temporal white matter infarct. -MR angio showed severe stenosis of right P2 PCA -Neurology on board.  Started stroke workup. -LDL: 23 -  Per neurology: Given high risk for hemorrhagic conversion of his stroke in the setting of significant thrombocytopenia.  Neuro recommended hold off resumption of anticoagulation for 1 to 2 days   Acute hypoxic respiratory failure-multifactorial in the setting of aspiration pneumonia, fluid overload/CHF exacerbation: -02/09/2023: Off of oxygen. -On aspiration precautions.  Currently n.p.o. due to  stroke.  - Continue pulmonary hygiene. -Attaining oxygen saturation on room air.  Enterobacter Colace bacteremia in the setting of UTI  right ureteral stone with mild hydronephrosis: Septic shock-resolved -1/30 was not a candidate for percutaneous  nephrostomy tube given elevated INR and thrombocytopenia.  - S/p cystoscopy with right ureteral stent placement 1/30 -Off of pressors now.  Continues to have low-grade fever and leukocytosis trending up. -Was on cefepime changed to meropenem due to encephalopathy  Left iliopsoas intramuscular bleed Right renal cyst with hemorrhage  LUE hematoma -noted on 11/31/25.  Associated with blood pressure cuff.  -Status post PRBC transfusion.  Continue to monitor H&H closely.  Ileus  -had NGT but pt removed this 1/27, passing flatus - reglan x 3 days started 1/28 - continues to have more liquid BM.    Hypernatremia: -On D5W.  Sodium 157 today  AKI: -Baseline creatinine 0.9.  Worsened after IV diuresis and shock.  Mild right hydronephrosis with right renal calculi.  Status post stent placement on 1/30 by urology.  Renal function improving.  Afib - on coumadin Valvular disease s/p bioprosthetic AVR 2012, w/ MV and TV repair  CAD, non-obstructive  HFpEF: -Remain euvolemic.  Continue to hold on beta-blocker and Coumadin.  Cardiology signed off on 02/06/2023. -INR goal is 2-3.  Avoid bringing down INR actively. -Continue to hold on Coumadin for next 1 to 2 days as above.  Anemia of chronic disease Acute on chronic Thrombocytopenia Coagulapathy on coumadin  -Continue to hold on Coumadin.  INR is 1.8 today  Protein calorie malnutrition: Dietitian on board  DVT prophylaxis: SCD Code Status: DNR Family Communication:  None present at bedside.  Plan of care discussed with patient in length and he verbalized understanding and agreed with it.  I called patient's son and discussed plan of care.  Disposition Plan: To be determined  Consultants:   Cardiology Urology PCCM Neurology  Procedures:  Cystoscopy and urine stent placement   Status is: Inpatient    Subjective: Patient seen and examined.  Lying comfortably on the bed.  Tells me he is doing okay.  Remained afebrile.    Objective: Vitals:   02/10/23 0910 02/10/23 0915 02/10/23 0930 02/10/23 0945  BP:      Pulse:  77 79 80  Resp:  (!) 27 (!) 27 (!) 26  Temp:      TempSrc:      SpO2: 98% 100% 99% 100%  Weight:      Height:        Intake/Output Summary (Last 24 hours) at 02/10/2023 1222 Last data filed at 02/10/2023 1138 Gross per 24 hour  Intake 2142.61 ml  Output 3350 ml  Net -1207.39 ml   Filed Weights   02/08/23 0500 02/09/23 0500 02/10/23 0701  Weight: 79.6 kg 77.7 kg 77.7 kg    Examination:  General exam: Appears calm and comfortable, on room air, appears very weak, sick and lethargic Respiratory system: Clear to auscultation. Respiratory effort normal. Cardiovascular system: S1 & S2 heard, RRR. No JVD, murmurs, rubs, gallops or clicks. No pedal edema. Gastrointestinal system: Abdomen is nondistended, soft and nontender. No organomegaly or masses felt. Normal bowel sounds  heard. Central nervous system: Sleepy but arousable and following commands.   Skin: No rashes, lesions or ulcers    Data Reviewed: I have personally reviewed following labs and imaging studies  CBC: Recent Labs  Lab 02/06/23 2216 02/07/23 0527 02/07/23 2141 02/08/23 0620 02/08/23 1831 02/09/23 0443 02/10/23 0453  WBC 10.8*   < > 12.7* 12.3* 11.2* 13.1* 15.2*  NEUTROABS 9.0*  --  9.6*  --   --   --   --   HGB 7.8*   < > 6.2* 6.2* 7.3* 7.6* 7.2*  HCT 25.3*   < > 19.9* 20.3* 23.2* 23.8* 22.6*  MCV 94.8   < > 95.2 95.8 95.1 94.4 94.6  PLT 32*   < > 23* 23* 41* 43* 57*   < > = values in this interval not displayed.   Basic Metabolic Panel: Recent Labs  Lab 02/06/23 0700 02/07/23 0527 02/07/23 0835 02/07/23 1717 02/08/23 0620 02/09/23 0443 02/09/23 0702  02/10/23 0453  NA 149* 156*   < > 154* 145 158* 158* 157*  K 3.8 4.2   < > 4.9 3.6 4.4 4.2 3.6  CL 114* 123*   < > 123* 113* 124* 123* 124*  CO2 22 21*  --  22 23 26 26 25   GLUCOSE 297* 155*   < > 314* 484* 155* 193* 175*  BUN 95* 99*   < > 98* 80* 65* 66* 53*  CREATININE 2.64* 2.33*   < > 2.33* 1.83* 1.46* 1.42* 1.31*  CALCIUM 8.4* 8.9  --  8.2* 7.7* 8.7* 8.8* 8.9  MG 2.9* 2.8*  --   --  2.3 2.2  --  2.2  PHOS 2.8 1.5*  --  3.3 3.1 3.7  --  3.0   < > = values in this interval not displayed.   GFR: Estimated Creatinine Clearance: 45.3 mL/min (A) (by C-G formula based on SCr of 1.31 mg/dL (H)). Liver Function Tests: Recent Labs  Lab 02/03/23 2348 02/07/23 1717 02/08/23 0620  AST 56*  --  48*  ALT 23  --  33  ALKPHOS 52  --  106  BILITOT 2.1*  --  1.2  PROT 7.2  --  5.2*  ALBUMIN 3.4* 2.2* 2.1*   No results for input(s): "LIPASE", "AMYLASE" in the last 168 hours. No results for input(s): "AMMONIA" in the last 168 hours. Coagulation Profile: Recent Labs  Lab 02/07/23 0527 02/08/23 0620 02/08/23 1831 02/09/23 0443 02/10/23 0453  INR 2.8* 2.8* 2.2* 2.1* 1.8*   Cardiac Enzymes: No results for input(s): "CKTOTAL", "CKMB", "CKMBINDEX", "TROPONINI" in the last 168 hours. BNP (last 3 results) No results for input(s): "PROBNP" in the last 8760 hours. HbA1C: Recent Labs    02/09/23 1600  HGBA1C 6.2*   CBG: Recent Labs  Lab 02/09/23 1959 02/09/23 2316 02/10/23 0326 02/10/23 0730 02/10/23 1144  GLUCAP 214* 146* 125* 146* 127*   Lipid Profile: Recent Labs    02/10/23 0453  CHOL 53  HDL 11*  LDLCALC 23  TRIG 93  CHOLHDL 4.8   Thyroid Function Tests: No results for input(s): "TSH", "T4TOTAL", "FREET4", "T3FREE", "THYROIDAB" in the last 72 hours. Anemia Panel: No results for input(s): "VITAMINB12", "FOLATE", "FERRITIN", "TIBC", "IRON", "RETICCTPCT" in the last 72 hours. Sepsis Labs: Recent Labs  Lab 02/03/23 1955 02/03/23 2348  LATICACIDVEN 3.1* 3.2*     Recent Results (from the past 240 hours)  Resp panel by RT-PCR (RSV, Flu A&B, Covid) Anterior Nasal Swab     Status: None  Collection Time: 02/02/23  7:30 AM   Specimen: Anterior Nasal Swab  Result Value Ref Range Status   SARS Coronavirus 2 by RT PCR NEGATIVE NEGATIVE Final    Comment: (NOTE) SARS-CoV-2 target nucleic acids are NOT DETECTED.  The SARS-CoV-2 RNA is generally detectable in upper respiratory specimens during the acute phase of infection. The lowest concentration of SARS-CoV-2 viral copies this assay can detect is 138 copies/mL. A negative result does not preclude SARS-Cov-2 infection and should not be used as the sole basis for treatment or other patient management decisions. A negative result may occur with  improper specimen collection/handling, submission of specimen other than nasopharyngeal swab, presence of viral mutation(s) within the areas targeted by this assay, and inadequate number of viral copies(<138 copies/mL). A negative result must be combined with clinical observations, patient history, and epidemiological information. The expected result is Negative.  Fact Sheet for Patients:  BloggerCourse.com  Fact Sheet for Healthcare Providers:  SeriousBroker.it  This test is no t yet approved or cleared by the [place] FDA and  has been authorized for detection and/or diagnosis of SARS-CoV-2 by FDA under an Emergency Use Authorization (EUA). This EUA will remain  in effect (meaning this test can be used) for the duration of the COVID-19 declaration under Section 564(b)(1) of the Act, 21 U.S.C.section 360bbb-3(b)(1), unless the authorization is terminated  or revoked sooner.       Influenza A by PCR NEGATIVE NEGATIVE Final   Influenza B by PCR NEGATIVE NEGATIVE Final    Comment: (NOTE) The Xpert Xpress SARS-CoV-2/FLU/RSV plus assay is intended as an aid in the diagnosis of influenza from  Nasopharyngeal swab specimens and should not be used as a sole basis for treatment. Nasal washings and aspirates are unacceptable for Xpert Xpress SARS-CoV-2/FLU/RSV testing.  Fact Sheet for Patients: BloggerCourse.com  Fact Sheet for Healthcare Providers: SeriousBroker.it  This test is not yet approved or cleared by the [place] FDA and has been authorized for detection and/or diagnosis of SARS-CoV-2 by FDA under an Emergency Use Authorization (EUA). This EUA will remain in effect (meaning this test can be used) for the duration of the COVID-19 declaration under Section 564(b)(1) of the Act, 21 U.S.C. section 360bbb-3(b)(1), unless the authorization is terminated or revoked.     Resp Syncytial Virus by PCR NEGATIVE NEGATIVE Final    Comment: (NOTE) Fact Sheet for Patients: BloggerCourse.com  Fact Sheet for Healthcare Providers: SeriousBroker.it  This test is not yet approved or cleared by the [place] FDA and has been authorized for detection and/or diagnosis of SARS-CoV-2 by FDA under an Emergency Use Authorization (EUA). This EUA will remain in effect (meaning this test can be used) for the duration of the COVID-19 declaration under Section 564(b)(1) of the Act, 21 U.S.C. section 360bbb-3(b)(1), unless the authorization is terminated or revoked.  Performed at [facility], [address], [place], [place] [address]   Urine Culture (for pregnant, neutropenic or urologic patients or patients with an indwelling urinary catheter)     Status: Abnormal   Collection Time: 02/03/23 11:25 AM   Specimen: Urine, Clean Catch  Result Value Ref Range Status   Specimen Description   Final    URINE, CLEAN CATCH Performed at [facility], [address], [place], [place] [address]    Special Requests   Final    NONE Performed at [facility], [address] [place], [place] [address]    Culture >=100,000 COLONIES/mL ENTEROBACTER  SPECIES (A)  Final   Report Status 02/05/2023 FINAL  Final   Organism ID, Bacteria ENTEROBACTER SPECIES (A)  Final      Susceptibility   Enterobacter species - MIC*    CEFEPIME <=0.12 SENSITIVE Sensitive     CIPROFLOXACIN <=0.25 SENSITIVE Sensitive     GENTAMICIN <=1 SENSITIVE Sensitive     IMIPENEM 1 SENSITIVE Sensitive     NITROFURANTOIN <=16 SENSITIVE Sensitive     TRIMETH/SULFA <=20 SENSITIVE Sensitive     PIP/TAZO <=4 SENSITIVE Sensitive ug/mL    * >=100,000 COLONIES/mL ENTEROBACTER SPECIES  Culture, blood (Routine X 2) w Reflex to ID Panel     Status: Abnormal   Collection Time: 02/03/23  5:53 PM   Specimen: BLOOD LEFT ARM  Result Value Ref Range Status   Specimen Description   Final    BLOOD LEFT ARM AEROBIC BOTTLE ONLY Performed at [facility], [address] [place], [place] [address]    Special Requests   Final    BOTTLES DRAWN AEROBIC AND ANAEROBIC Blood Culture results may not be optimal due to an inadequate volume of blood received in culture bottles Performed at [facility], [address], [place], [place] [address]    Culture  Setup Time (A)  Final    GRAM VARIABLE ROD AEROBIC BOTTLE ONLY CRITICAL VALUE NOTED.  VALUE IS CONSISTENT WITH PREVIOUSLY REPORTED AND CALLED VALUE.    Culture (A)  Final    ENTEROBACTER CLOACAE SUSCEPTIBILITIES PERFORMED ON PREVIOUS CULTURE WITHIN THE LAST 5 DAYS. Performed at [facility], [address], [place], [place] [address]    Report Status 02/06/2023 FINAL  Final  Culture, blood (Routine X 2) w Reflex to ID Panel     Status: Abnormal   Collection Time: 02/03/23  5:53 PM   Specimen: BLOOD LEFT ARM  Result Value Ref Range Status   Specimen Description   Final    BLOOD LEFT ARM AEROBIC BOTTLE ONLY Performed at [facility], [address], [place], [place] [address]     Special Requests   Final    BOTTLES DRAWN AEROBIC AND ANAEROBIC Blood Culture results may not be optimal due to an inadequate volume of blood received in culture bottles Performed at [facility], [address], [place], [place] [address]    Culture  Setup Time (A)  Final    GRAM VARIABLE ROD AEROBIC BOTTLE ONLY CRITICAL RESULT CALLED TO, READ BACK BY AND VERIFIED WITH: Beckey Rutter D [id number] FCP  Performed at [facility], [address], [place], [place] [address]    Culture ENTEROBACTER CLOACAE (A)  Final   Report Status 02/06/2023 FINAL  Final   Organism ID, Bacteria ENTEROBACTER CLOACAE  Final      Susceptibility   Enterobacter cloacae - MIC*    CEFEPIME <=0.12 SENSITIVE Sensitive     CEFTAZIDIME <=1 SENSITIVE Sensitive     CIPROFLOXACIN <=0.25 SENSITIVE Sensitive     GENTAMICIN <=1 SENSITIVE Sensitive     IMIPENEM 1 SENSITIVE Sensitive     TRIMETH/SULFA <=20 SENSITIVE Sensitive     PIP/TAZO 8 SENSITIVE Sensitive ug/mL    * ENTEROBACTER CLOACAE  Blood Culture ID Panel (Reflexed)     Status: Abnormal   Collection Time: 02/03/23  5:53 PM  Result Value Ref Range Status   Enterococcus faecalis NOT DETECTED NOT DETECTED Final   Enterococcus Faecium NOT DETECTED NOT DETECTED Final   Listeria monocytogenes NOT DETECTED  NOT DETECTED Final   Staphylococcus species NOT DETECTED NOT DETECTED Final   Staphylococcus aureus (BCID) NOT DETECTED NOT DETECTED Final   Staphylococcus epidermidis NOT DETECTED NOT DETECTED Final   Staphylococcus lugdunensis NOT DETECTED NOT DETECTED Final   Streptococcus species NOT DETECTED NOT DETECTED Final   Streptococcus agalactiae NOT DETECTED NOT DETECTED Final   Streptococcus pneumoniae NOT DETECTED NOT DETECTED Final   Streptococcus pyogenes NOT DETECTED NOT DETECTED Final   A.calcoaceticus-baumannii NOT DETECTED NOT DETECTED Final   Bacteroides fragilis NOT DETECTED NOT DETECTED Final   Enterobacterales DETECTED (A) NOT  DETECTED Final    Comment: Enterobacterales represent a large order of gram negative bacteria, not a single organism. CRITICAL RESULT CALLED TO, READ BACK BY AND VERIFIED WITH: PHARMD SYDNEY D [id number] FCP     Enterobacter cloacae complex DETECTED (A) NOT DETECTED Final    Comment: CRITICAL RESULT CALLED TO, READ BACK BY AND VERIFIED WITH: PHARMD SYDNEY D [id number] FCP     Escherichia coli NOT DETECTED NOT DETECTED Final   Klebsiella aerogenes NOT DETECTED NOT DETECTED Final   Klebsiella oxytoca NOT DETECTED NOT DETECTED Final   Klebsiella pneumoniae NOT DETECTED NOT DETECTED Final   Proteus species NOT DETECTED NOT DETECTED Final   Salmonella species NOT DETECTED NOT DETECTED Final   Serratia marcescens NOT DETECTED NOT DETECTED Final   Haemophilus influenzae NOT DETECTED NOT DETECTED Final   Neisseria meningitidis NOT DETECTED NOT DETECTED Final   Pseudomonas aeruginosa NOT DETECTED NOT DETECTED Final   Stenotrophomonas maltophilia NOT DETECTED NOT DETECTED Final   Candida albicans NOT DETECTED NOT DETECTED Final   Candida auris NOT DETECTED NOT DETECTED Final   Candida glabrata NOT DETECTED NOT DETECTED Final   Candida krusei NOT DETECTED NOT DETECTED Final   Candida parapsilosis NOT DETECTED NOT DETECTED Final   Candida tropicalis NOT DETECTED NOT DETECTED Final   Cryptococcus neoformans/gattii NOT DETECTED NOT DETECTED Final   CTX-M ESBL NOT DETECTED NOT DETECTED Final   Carbapenem resistance IMP NOT DETECTED NOT DETECTED Final   Carbapenem resistance KPC NOT DETECTED NOT DETECTED Final   Carbapenem resistance NDM NOT DETECTED NOT DETECTED Final   Carbapenem resist OXA 48 LIKE NOT DETECTED NOT DETECTED Final   Carbapenem resistance VIM NOT DETECTED NOT DETECTED Final    Comment: Performed at [facility], [address], [place], [place] [address]  Urine Culture     Status: Abnormal   Collection Time: 02/04/23  1:18 AM   Specimen: Urine, Random  Result Value Ref  Range Status   Specimen Description   Final    URINE, RANDOM Performed at [facility], [address], [place], [place] [address]    Special Requests   Final    NONE Reflexed from [id number] Performed at [facility], [address] [place], [place] [address]    Culture (A)  Final    >=100,000 COLONIES/mL ENTEROBACTER SPECIES Two isolates with different morphologies were identified as the same organism.The most resistant organism was reported. Performed at [facility], [address], [place], [place] [address]    Report Status 02/07/2023 FINAL  Final   Organism ID, Bacteria ENTEROBACTER SPECIES (A)  Final      Susceptibility   Enterobacter species - MIC*    CEFEPIME <=0.12 SENSITIVE Sensitive     CIPROFLOXACIN <=0.25 SENSITIVE Sensitive     GENTAMICIN <=1 SENSITIVE Sensitive     IMIPENEM 1 SENSITIVE Sensitive  NITROFURANTOIN <=16 SENSITIVE Sensitive     TRIMETH/SULFA <=20 SENSITIVE Sensitive     PIP/TAZO 8 SENSITIVE Sensitive ug/mL    * >=100,000 COLONIES/mL ENTEROBACTER SPECIES  MRSA Next Gen by PCR, Nasal     Status: None   Collection Time: 02/04/23  1:52 AM   Specimen: Nasal Mucosa; Nasal Swab  Result Value Ref Range Status   MRSA by PCR Next Gen NOT DETECTED NOT DETECTED Final    Comment: (NOTE) The GeneXpert MRSA Assay (FDA approved for NASAL specimens only), is one component of a comprehensive MRSA colonization surveillance program. It is not intended to diagnose MRSA infection nor to guide or monitor treatment for MRSA infections. Test performance is not FDA approved in patients less than 24 years old. Performed at [facility], [address], [place], [place] [address]   Culture, blood (Routine X 2) w Reflex to ID Panel     Status: None (Preliminary result)   Collection Time: 02/06/23  6:18 PM   Specimen: BLOOD LEFT ARM  Result Value Ref Range Status   Specimen Description   Final    BLOOD LEFT  ARM Performed at [facility], [address] [place], [place] [address]    Special Requests   Final    BOTTLES DRAWN AEROBIC ONLY Blood Culture results may not be optimal due to an inadequate volume of blood received in culture bottles Performed at [facility], [address] [place], [place] [address]    Culture   Final    NO GROWTH 4 DAYS Performed at [facility], [address], [place], [place] [address]    Report Status PENDING  Incomplete  Culture, blood (Routine X 2) w Reflex to ID Panel     Status: None (Preliminary result)   Collection Time: 02/06/23  6:18 PM   Specimen: BLOOD LEFT HAND  Result Value Ref Range Status   Specimen Description   Final    BLOOD LEFT HAND Performed at [facility], [address], [place], [place] [address]    Special Requests   Final    BOTTLES DRAWN AEROBIC ONLY Blood Culture adequate volume Performed at [facility], [address] [place], [place] [address]    Culture  Setup Time   Final    GRAM POSITIVE COCCI AEROBIC BOTTLE ONLY CRITICAL RESULT CALLED TO, READ BACK BY AND VERIFIED WITH: M LILLISTON,PHARMD@0623  02/09/23 MK Performed at [facility], [address], [place], [place] [address]    Culture GRAM POSITIVE COCCI  Final   Report Status PENDING  Incomplete  Blood Culture ID Panel (Reflexed)     Status: Abnormal   Collection Time: 02/06/23  6:18 PM  Result Value Ref Range Status   Enterococcus faecalis NOT DETECTED NOT DETECTED Final   Enterococcus Faecium NOT DETECTED NOT DETECTED Final   Listeria monocytogenes NOT DETECTED NOT DETECTED Final   Staphylococcus species DETECTED (A) NOT DETECTED Final    Comment: CRITICAL RESULT CALLED TO, READ BACK BY AND VERIFIED WITH: M LILLISTON,PHARMD@0623  02/09/23 MK    Staphylococcus aureus (BCID) NOT DETECTED NOT DETECTED Final   Staphylococcus epidermidis DETECTED (A) NOT DETECTED Final    Comment: Methicillin  (oxacillin) resistant coagulase negative staphylococcus. Possible blood culture contaminant (unless isolated from more than one blood culture draw or clinical case suggests pathogenicity). No antibiotic treatment is indicated for blood  culture contaminants. CRITICAL RESULT CALLED TO, READ BACK BY AND VERIFIED WITH: M  LILLISTON,PHARMD@0623  02/09/23 MK    Staphylococcus lugdunensis NOT DETECTED NOT DETECTED Final   Streptococcus species NOT DETECTED NOT DETECTED Final   Streptococcus agalactiae NOT DETECTED NOT DETECTED Final   Streptococcus pneumoniae NOT DETECTED NOT DETECTED Final   Streptococcus pyogenes NOT DETECTED NOT DETECTED Final   A.calcoaceticus-baumannii NOT DETECTED NOT DETECTED Final   Bacteroides fragilis NOT DETECTED NOT DETECTED Final   Enterobacterales NOT DETECTED NOT DETECTED Final   Enterobacter cloacae complex NOT DETECTED NOT DETECTED Final   Escherichia coli NOT DETECTED NOT DETECTED Final   Klebsiella aerogenes NOT DETECTED NOT DETECTED Final   Klebsiella oxytoca NOT DETECTED NOT DETECTED Final   Klebsiella pneumoniae NOT DETECTED NOT DETECTED Final   Proteus species NOT DETECTED NOT DETECTED Final   Salmonella species NOT DETECTED NOT DETECTED Final   Serratia marcescens NOT DETECTED NOT DETECTED Final   Haemophilus influenzae NOT DETECTED NOT DETECTED Final   Neisseria meningitidis NOT DETECTED NOT DETECTED Final   Pseudomonas aeruginosa NOT DETECTED NOT DETECTED Final   Stenotrophomonas maltophilia NOT DETECTED NOT DETECTED Final   Candida albicans NOT DETECTED NOT DETECTED Final   Candida auris NOT DETECTED NOT DETECTED Final   Candida glabrata NOT DETECTED NOT DETECTED Final   Candida krusei NOT DETECTED NOT DETECTED Final   Candida parapsilosis NOT DETECTED NOT DETECTED Final   Candida tropicalis NOT DETECTED NOT DETECTED Final   Cryptococcus neoformans/gattii NOT DETECTED NOT DETECTED Final   Methicillin resistance mecA/C DETECTED (A) NOT DETECTED Final     Comment: CRITICAL RESULT CALLED TO, READ BACK BY AND VERIFIED WITH: M LILLISTON,[email or web address]  02/09/23 MK Performed at [facility], [address] [place], [place] [address]   Urine Culture     Status: None   Collection Time: 02/07/23  8:42 AM   Specimen: Urine, Cystoscope  Result Value Ref Range Status   Specimen Description   Final    URINE, CLEAN CATCH Performed at [facility], [address] [place], [place] [address]    Special Requests   Final    URINE CYSTOSCOPY Performed at [facility], [address], [place], [place] [address]    Culture   Final    NO GROWTH Performed at [facility], [address], [place], [place] [address]    Report Status 02/08/2023 FINAL  Final      Radiology Studies: DG CHEST PORT 1 VIEW Result Date: 02/10/2023 CLINICAL DATA:  Respiratory failure EXAM: PORTABLE CHEST 1 VIEW COMPARISON:  02/05/2023 FINDINGS: Right-sided PICC line terminates at the superior caval/atrial junction. The low left hemithorax is minimally excluded. Median sternotomy for aortic valve repair. Left atrial appendage occlusion device. Midline trachea. Mild cardiomegaly. No right and no definite left pleural effusion. No pneumothorax. Left base scarring or subsegmental atelectasis. No lobar consolidation. IMPRESSION: Cardiomegaly without congestive failure. Electronically Signed   By: Jeronimo Greaves M.D.   On: 02/10/2023 09:22   MR ANGIO HEAD WO CONTRAST Result Date: 02/10/2023 CLINICAL DATA:  Stroke/TIA, determine embolic source EXAM: MRA HEAD WITHOUT CONTRAST TECHNIQUE: Angiographic images of the Circle of Willis were acquired using MRA technique without intravenous contrast. COMPARISON:  None Available. FINDINGS: Anterior circulation: Bilateral intracranial ICAs, MCAs and ACAs are patent without proximal hemodynamically significant stenosis. Posterior circulation: Bilateral intradural vertebral arteries and basilar artery are  patent without significant stenosis. Severe stenosis of the right P2 PCA. Left PCA is patent without significant stenosis IMPRESSION: Severe stenosis of the right P2 PCA. Electronically Signed  By: Feliberto Harts M.D.   On: 02/10/2023 00:03   [name] Result Date: 02/09/2023 CLINICAL DATA:  Neuro deficit, acute, stroke suspected. EXAM: MRI HEAD WITHOUT CONTRAST TECHNIQUE: Multiplanar, multiecho pulse sequences of the brain and surrounding structures were obtained without intravenous contrast. COMPARISON:  Head CT 02/09/2023 and MRI 11/09/2021 FINDINGS: Brain: There are small acute infarcts involving subcortical white matter in the posterior left frontal lobe and posterior left temporal lobe. A few small chronic cerebral and cerebellar microhemorrhages are unchanged from the prior MRI. Moderate-sized chronic right MCA and small chronic right cerebellar infarcts are again noted with ex vacuo dilatation of the right lateral ventricle. There is a background of mild chronic small vessel ischemia in the cerebral white matter which is similar to the prior MRI. There is moderate cerebral atrophy with prominent extra-axial CSF over the right greater than left cerebral convexities being unchanged from the prior MRI and attributed to atrophy and subarachnoid space enlargement. No mass, midline shift, or definite extra-axial fluid collection is identified. Vascular: Major intracranial vascular flow voids are preserved. Skull and upper cervical spine: Unremarkable bone marrow signal. Sinuses/Orbits: Bilateral cataract extraction. Clear paranasal sinuses. Large chronic right mastoid effusion. Other: None. IMPRESSION: 1. Small acute left frontal and left temporal white matter infarcts. 2. Chronic ischemia including old right MCA and right cerebellar infarcts. Electronically Signed   By: Sebastian Ache M.D.   On: 02/09/2023 15:05   CT HEAD CODE STROKE WO CONTRAST Result Date: 02/09/2023 CLINICAL DATA:  Code  stroke. Neuro deficit, acute, stroke suspected. EXAM: CT HEAD WITHOUT CONTRAST TECHNIQUE: Contiguous axial images were obtained from the base of the skull through the vertex without intravenous contrast. RADIATION DOSE REDUCTION: This exam was performed according to the departmental dose-optimization program which includes automated exposure control, adjustment of the mA and/or kV according to patient size and/or use of iterative reconstruction technique. COMPARISON:  Head CT 02/16/2022 and MRI 11/09/2021 FINDINGS: Brain: There is no evidence of an acute cortically based infarct, intracranial hemorrhage, mass, midline shift, or extra-axial fluid collection. A moderate-sized chronic right MCA infarct is unchanged with involvement of the operculum, insula, and basal ganglia and associated ex vacuo dilatation of the body and frontal horn of the right lateral ventricle. A new 7 mm discrete hypodense focus in the subcortical white matter of the posterior left frontal lobe is suggestive of an age indeterminate lacunar infarct. Hypodensities elsewhere in the cerebral white matter bilaterally are unchanged and nonspecific but compatible with mild chronic small vessel ischemic disease. A small chronic right cerebellar infarct is unchanged. There is moderate cerebral atrophy. Enlarged extra-axial CSF spaces over the frontal convexities are unchanged and attributed to atrophy. Vascular: Calcified atherosclerosis at the skull base. No hyperdense vessel. Skull: No acute fracture or suspicious osseous lesion. Sinuses/Orbits: Visualized paranasal sinuses are clear. Large chronic right mastoid effusion. Bilateral cataract extraction. Other: None. ASPECTS ([place] Stroke Program Early CT Score) - Ganglionic level infarction (caudate, lentiform nuclei, internal capsule, insula, M1-M3 cortex): 7 - Supraganglionic infarction (M4-M6 cortex): 3 Total score (0-10 with 10 being normal): 10 These results were communicated to Dr. Iver Nestle at  1:30 pm on 02/09/2023 by text page via the [facility] messaging system. IMPRESSION: 1. No evidence of acute cortically based infarct or intracranial hemorrhage. ASPECTS of 10. 2. Age indeterminate left frontal white matter lacunar infarct. 3. Chronic ischemia including an old right MCA infarct. Electronically Signed   By: Sebastian Ache M.D.   On: 02/09/2023 13:31  Scheduled Meds:  sodium chloride   Intravenous Once   allopurinol  50 mg Oral q morning   arformoterol  15 mcg Nebulization BID   budesonide (PULMICORT) nebulizer solution  0.5 mg Nebulization BID   Chlorhexidine Gluconate Cloth  6 each Topical Daily   docusate  100 mg Oral Daily   feeding supplement  1 Bottle Oral TID BM   insulin aspart  0-9 Units Subcutaneous Q4H   ipratropium-albuterol  3 mL Nebulization Q6H   multivitamin with minerals  1 tablet Oral Daily   mouth rinse  15 mL Mouth Rinse 4 times per day   pantoprazole (PROTONIX) IV  40 mg Intravenous Q12H   sodium chloride flush  10-40 mL Intracatheter Q12H   sodium chloride flush  3-10 mL Intravenous Q12H   Continuous Infusions:  sodium chloride Stopped (02/07/23 1036)   dextrose 125 mL/hr at 02/10/23 1138   meropenem (MERREM) IV Stopped (02/10/23 1026)     LOS: 7 days   Time spent: 40 minutes   Daleysa Kristiansen Estill Cotta, MD [facility]  If 7PM-7AM, please contact night-coverage [email or web address] 02/10/2023, 12:22 PM

## 2023-02-10 NOTE — Plan of Care (Signed)
Data reviewed   Lab Results  Component Value Date   HGBA1C 6.2 (H) 02/09/2023    Lab Results  Component Value Date   CHOL 53 02/10/2023   HDL 11 (L) 02/10/2023   LDLCALC 23 02/10/2023   TRIG 93 02/10/2023   CHOLHDL 4.8 02/10/2023    Basic Metabolic Panel: Recent Labs  Lab 02/06/23 0700 02/07/23 0527 02/07/23 0835 02/07/23 1717 02/08/23 0620 02/09/23 0443 02/09/23 0702 02/10/23 0453  NA 149* 156*   < > 154* 145 158* 158* 157*  K 3.8 4.2   < > 4.9 3.6 4.4 4.2 3.6  CL 114* 123*   < > 123* 113* 124* 123* 124*  CO2 22 21*  --  22 23 26 26 25   GLUCOSE 297* 155*   < > 314* 484* 155* 193* 175*  BUN 95* 99*   < > 98* 80* 65* 66* 53*  CREATININE 2.64* 2.33*   < > 2.33* 1.83* 1.46* 1.42* 1.31*  CALCIUM 8.4* 8.9  --  8.2* 7.7* 8.7* 8.8* 8.9  MG 2.9* 2.8*  --   --  2.3 2.2  --  2.2  PHOS 2.8 1.5*  --  3.3 3.1 3.7  --  3.0   < > = values in this interval not displayed.    CBC: Recent Labs  Lab 02/06/23 2216 02/07/23 0527 02/07/23 2141 02/08/23 0620 02/08/23 1831 02/09/23 0443 02/10/23 0453  WBC 10.8*   < > 12.7* 12.3* 11.2* 13.1* 15.2*  NEUTROABS 9.0*  --  9.6*  --   --   --   --   HGB 7.8*   < > 6.2* 6.2* 7.3* 7.6* 7.2*  HCT 25.3*   < > 19.9* 20.3* 23.2* 23.8* 22.6*  MCV 94.8   < > 95.2 95.8 95.1 94.4 94.6  PLT 32*   < > 23* 23* 41* 43* 57*   < > = values in this interval not displayed.    Coagulation Studies: Recent Labs    02/08/23 0620 02/08/23 1831 02/09/23 0443 02/10/23 0453  LABPROT 29.9* 25.0* 23.8* 21.5*  INR 2.8* 2.2* 2.1* 1.8*     MRA head personally reviewed, agree with radiology:   Severe stenosis of the right P2 PCA.   Carotid US Right Carotid: Velocities in the right ICA are consistent with a 1-39%  stenosis. Left Carotid: Velocities in the left ICA are consistent with a 1-39%  stenosis.  Vertebrals: Bilateral vertebral arteries demonstrate antegrade flow.  Subclavians: Normal flow hemodynamics were seen in bilateral subclavian                arteries.   # Afib  # New strokes, embolic in appearance, known Afib, INR therapeutic but received blood products including FFP and platelets due to bleeding and thrombocytopenia - A1c and LDL meeting goals as documented above - Vessel imaging with, right P2 stenosis which is non-contributatory and carotids without significant stenosis - Hold Anticoag for today. Repeat head CT when medically ready to resume anticoagulation, if only expected evolution of stroke without new hemorrhage or other acute process, okay to resume anticoagulation at that time from stroke perspective (risk of ICH would be outweighed by benefit of reducing risk of further stroke) - Until medically ready to start anticoagulation, use ASA 325 mg daily if able (STOP when anticoagulation is resumed)  # Enterobacter Cloacae UTI and bacteremia, complicated by sepsis  - On meropenem  - Low grade fever to 37.8 and increasing leukocytosis - This bacteria is a very  rare cause of endocarditis so I do not feel strongly about pursuing TEE, but would defer to ID if felt to be indicated   Neurology will sign off at this time, please reach out with any additional questions/concerns. Discussed with primary team via secure chat  Brooke Dare MD-PhD [facility] [phone] Available 8 AM to 8 PM, outside these hours please contact Neurologist on call listed on AMION

## 2023-02-10 NOTE — Evaluation (Signed)
Occupational Therapy Evaluation Patient Details Name: [name]. MRN: [id number] DOB: February 14, 1935 [name] Date: 02/10/2023   History of Present Illness Patient is a 88 year old male who presented on 1/25 with dyspnea and hypoxia. Patient was admited with concerns of SBO. 1/27 patient pulled out NG tube with increased work of breath and fever. 1/29 patient on HFNC with CT revealing mild right hydronephrosis with 12 mm right proximal ureteral stone.1/31 patient was taken off vasopressors and received PRBCs. 2/1 patient found to have left side weakness with code stroke called. MRI revealed L MCA. PMH: R TKR, L THR, L TKA,h/o stroke, peripheral neuropathy, gout, HTN.   Clinical Impression   Patient is a 88 year old male who was admitted for above. Patient was living at home preforming ADLs independently prior to admission. Currently, patient is +2 to advance to EOB with increased time. Patient was noted to have decreased functional activity tolernace, decreased ROM, decreased BUE strength, decreased endurance, decreased sitting balance, decreased standing balanced, decreased safety awareness, and decreased knowledge of AE/AD impacting participation in ADLs. Patient will benefit from continued inpatient follow up therapy, <3 hours/day. Patient would continue to benefit from skilled OT services at this time while admitted and after d/c to address noted deficits in order to improve overall safety and independence in ADLs.        If plan is discharge home, recommend the following: Two people to help with walking and/or transfers;A lot of help with bathing/dressing/bathroom;Assistance with cooking/housework;Direct supervision/assist for medications management;Assist for transportation;Help with stairs or ramp for entrance;Direct supervision/assist for financial management    Functional Status Assessment  Patient has had a recent decline in their functional status and demonstrates the ability to make  significant improvements in function in a reasonable and predictable amount of time.  Equipment Recommendations  None recommended by OT       Precautions / Restrictions Precautions Precautions: Fall Restrictions Weight Bearing Restrictions Per Provider Order: No      Mobility Bed Mobility Overal bed mobility: Needs Assistance Bed Mobility: Supine to Sit, Sit to Supine     Supine to sit: Mod assist, +2 for physical assistance, +2 for safety/equipment Sit to supine: Mod assist, +2 for physical assistance, +2 for safety/equipment   General bed mobility comments: with increased time and cues for proper hand and foot movements.         Balance Overall balance assessment: Needs assistance Sitting-balance support: Bilateral upper extremity supported, Feet supported Sitting balance-Leahy Scale: Fair         Standing balance comment: unable to attempt           ADL either performed or assessed with clinical judgement   ADL Overall ADL's : Needs assistance/impaired Eating/Feeding: NPO   Grooming: Maximal assistance;Sitting   Upper Body Bathing: Sitting;Maximal assistance   Lower Body Bathing: Sitting/lateral leans;Maximal assistance   Upper Body Dressing : Sitting;Maximal assistance   Lower Body Dressing: Sitting/lateral leans;Total assistance     Toilet Transfer Details (indicate cue type and reason): unable to attempt with patient reporting need to lie back down with attempts to stand fter being positioned in STEDY in SDU Toileting- Clothing Manipulation and Hygiene: Bed level;Total assistance               Vision   Additional Comments: difficult to assess during session. able to turn head and look at therapist when asked to            Pertinent Vitals/Pain Pain Assessment Pain  Assessment: Faces Faces Pain Scale: No hurt     Extremity/Trunk Assessment Upper Extremity Assessment Upper Extremity Assessment: Generalized weakness;Difficult to assess  due to impaired cognition;LUE deficits/detail LUE Deficits / Details: noted to have cuts on posterior hand with large dressing around elbow area, patient having hard time holding onto STEDY with set up to stand with hand tremors at rest and with movement. patient able to grip minimally bilaterally on this date. needed hand over hand to keep arm up in FF   Lower Extremity Assessment Lower Extremity Assessment: Defer to PT evaluation   Cervical / Trunk Assessment Cervical / Trunk Assessment: Kyphotic   Communication Communication Communication: No apparent difficulties   Cognition Arousal: Lethargic Behavior During Therapy: Flat affect Overall Cognitive Status: Difficult to assess               General Comments: patient slow to respond to questions, able to state name clearly,     General Comments  blood pressure supine in bed 175/72 mmhg sitting EOB BP was 198/96 mmhg            Home Living Family/patient expects to be discharged to:: Private residence Living Arrangements: Spouse/significant other Available Help at Discharge: Family;Available 24 hours/day Type of Home: Apartment Home Access: Level entry           Bathroom Shower/Tub: Arts development officer Toilet: Handicapped height     Home Equipment: Agricultural consultant (2 wheels);Cane - single point;Shower seat;BSC/3in1;Grab bars - tub/shower   Additional Comments: Pt lives at [facility] [employer] retirement community, info was taken from chart with patient unable to report PLOF      Prior Functioning/Environment Prior Level of Function : Independent/Modified Independent;Driving             Mobility Comments: occasional use of cane          OT Problem List: Decreased strength;Decreased knowledge of precautions;Decreased cognition;Decreased safety awareness;Decreased activity tolerance;Impaired UE functional use;Decreased knowledge of use of DME or AE;Cardiopulmonary status limiting activity;Decreased range  of motion      OT Treatment/Interventions: Self-care/ADL training;DME and/or AE instruction;Therapeutic activities;Balance training;Therapeutic exercise;Patient/family education;Energy conservation    OT Goals(Current goals can be found in the care plan section) Acute Rehab OT Goals Patient Stated Goal: none stated OT Goal Formulation: Patient unable to participate in goal setting Time For Goal Achievement: 02/24/23 Potential to Achieve Goals: Fair  OT Frequency: Min 1X/week    Co-evaluation PT/OT/SLP Co-Evaluation/Treatment: Yes Reason for Co-Treatment: To address functional/ADL transfers PT goals addressed during session: Mobility/safety with mobility OT goals addressed during session: ADL's and self-care      AM-PAC OT "6 Clicks" Daily Activity     Outcome Measure Help from another person eating meals?: Total (NPO) Help from another person taking care of personal grooming?: A Lot Help from another person toileting, which includes using toliet, bedpan, or urinal?: Total Help from another person bathing (including washing, rinsing, drying)?: Total Help from another person to put on and taking off regular upper body clothing?: Total Help from another person to put on and taking off regular lower body clothing?: Total 6 Click Score: 7   End of Session Nurse Communication: Mobility status  Activity Tolerance: Patient limited by fatigue Patient left: in bed;with call bell/phone within reach;with bed alarm set  OT Visit Diagnosis: Unsteadiness on feet (R26.81);Other abnormalities of gait and mobility (R26.89);Muscle weakness (generalized) (M62.81)                Time: 1610-9604 OT Time  Calculation (min): 25 min Charges:  OT General Charges $OT Visit: 1 Visit OT Evaluation $OT Eval Moderate Complexity: 1 Mod  Birdie Fetty OTR/L, MS Acute Rehabilitation Department Office# [phone]   Selinda Flavin 02/10/2023, 12:41 PM

## 2023-02-10 NOTE — Plan of Care (Signed)
  Problem: Health Behavior/Discharge Planning: Goal: Ability to manage health-related needs will improve Outcome: Progressing   Problem: Clinical Measurements: Goal: Respiratory complications will improve Outcome: Progressing   Problem: Clinical Measurements: Goal: Ability to maintain clinical measurements within normal limits will improve Outcome: Not Progressing

## 2023-02-11 DIAGNOSIS — I10 Essential (primary) hypertension: Secondary | ICD-10-CM

## 2023-02-11 DIAGNOSIS — E876 Hypokalemia: Secondary | ICD-10-CM

## 2023-02-11 DIAGNOSIS — J9601 Acute respiratory failure with hypoxia: Secondary | ICD-10-CM | POA: Diagnosis not present

## 2023-02-11 DIAGNOSIS — Z66 Do not resuscitate: Secondary | ICD-10-CM

## 2023-02-11 DIAGNOSIS — D696 Thrombocytopenia, unspecified: Secondary | ICD-10-CM

## 2023-02-11 DIAGNOSIS — D500 Iron deficiency anemia secondary to blood loss (chronic): Secondary | ICD-10-CM | POA: Diagnosis not present

## 2023-02-11 DIAGNOSIS — I4821 Permanent atrial fibrillation: Secondary | ICD-10-CM | POA: Diagnosis not present

## 2023-02-11 DIAGNOSIS — I639 Cerebral infarction, unspecified: Secondary | ICD-10-CM

## 2023-02-11 DIAGNOSIS — E440 Moderate protein-calorie malnutrition: Secondary | ICD-10-CM

## 2023-02-11 LAB — PREPARE FRESH FROZEN PLASMA: Unit division: 0

## 2023-02-11 LAB — GLUCOSE, CAPILLARY
Glucose-Capillary: 146 mg/dL — ABNORMAL HIGH (ref 70–99)
Glucose-Capillary: 143 mg/dL — ABNORMAL HIGH (ref 70–99)
Glucose-Capillary: 146 mg/dL — ABNORMAL HIGH (ref 70–99)
Glucose-Capillary: 127 mg/dL — ABNORMAL HIGH (ref 70–99)
Glucose-Capillary: 152 mg/dL — ABNORMAL HIGH (ref 70–99)
Glucose-Capillary: 156 mg/dL — ABNORMAL HIGH (ref 70–99)
Glucose-Capillary: 137 mg/dL — ABNORMAL HIGH (ref 70–99)
Glucose-Capillary: 120 mg/dL — ABNORMAL HIGH (ref 70–99)

## 2023-02-11 LAB — PREPARE RBC (CROSSMATCH)

## 2023-02-11 LAB — BPAM PLATELET PHERESIS
Blood Product Expiration Date: 202502042359
ISSUE DATE / TIME: 202501311239
Unit Type and Rh: 6200

## 2023-02-11 LAB — BASIC METABOLIC PANEL
Anion gap: 7 (ref 5–15)
BUN: 41 mg/dL — ABNORMAL HIGH (ref 8–23)
CO2: 22 mmol/L (ref 22–32)
Calcium: 7.3 mg/dL — ABNORMAL LOW (ref 8.9–10.3)
Chloride: 112 mmol/L — ABNORMAL HIGH (ref 98–111)
Creatinine, Ser: 1.37 mg/dL — ABNORMAL HIGH (ref 0.61–1.24)
GFR, Estimated: 51 mL/min — ABNORMAL LOW (ref >60)
Glucose, Bld: 526 mg/dL (ref 70–99)
Potassium: 3 mmol/L — ABNORMAL LOW (ref 3.5–5.1)
Sodium: 141 mmol/L (ref 135–145)

## 2023-02-11 LAB — TYPE AND SCREEN
ABO/RH(D): O POS
Antibody Screen: NEGATIVE
Unit division: 0
Unit division: 0
Unit division: 0

## 2023-02-11 LAB — CULTURE, BLOOD (ROUTINE X 2): Culture: NO GROWTH

## 2023-02-11 LAB — MAGNESIUM: Magnesium: 2 mg/dL (ref 1.7–2.4)

## 2023-02-11 LAB — BPAM RBC
Blood Product Expiration Date: 202503022359
Blood Product Expiration Date: 202503022359
Blood Product Expiration Date: 202503032359
ISSUE DATE / TIME: 202501310017
ISSUE DATE / TIME: 202501311454
Unit Type and Rh: 5100
Unit Type and Rh: 5100
Unit Type and Rh: 5100

## 2023-02-11 LAB — CBC
HCT: 19 % — ABNORMAL LOW (ref 39.0–52.0)
Hemoglobin: 5.9 g/dL — CL (ref 13.0–17.0)
MCH: 29.6 pg (ref 26.0–34.0)
MCHC: 31.1 g/dL (ref 30.0–36.0)
MCV: 95.5 fL (ref 80.0–100.0)
Platelets: 67 10*3/uL — ABNORMAL LOW (ref 150–400)
RBC: 1.99 MIL/uL — ABNORMAL LOW (ref 4.22–5.81)
RDW: 17.3 % — ABNORMAL HIGH (ref 11.5–15.5)
WBC: 12.8 10*3/uL — ABNORMAL HIGH (ref 4.0–10.5)
nRBC: 0 % (ref 0.0–0.2)

## 2023-02-11 LAB — HEMOGLOBIN AND HEMATOCRIT, BLOOD
HCT: 21.5 % — ABNORMAL LOW (ref 39.0–52.0)
Hemoglobin: 6.7 g/dL — CL (ref 13.0–17.0)

## 2023-02-11 LAB — PREPARE PLATELET PHERESIS: Unit division: 0

## 2023-02-11 LAB — BPAM FFP
Blood Product Expiration Date: 202502052359
ISSUE DATE / TIME: 202501311054
Unit Type and Rh: 5100

## 2023-02-11 LAB — PHOSPHORUS: Phosphorus: 3.4 mg/dL (ref 2.5–4.6)

## 2023-02-11 LAB — PROTIME-INR
INR: 1.8 — ABNORMAL HIGH (ref 0.8–1.2)
Prothrombin Time: 21.4 s — ABNORMAL HIGH (ref 11.4–15.2)

## 2023-02-11 MED ORDER — SODIUM CHLORIDE 0.9% IV SOLUTION
Freq: Once | INTRAVENOUS | Status: AC
Start: 2023-02-11 — End: 2023-02-11

## 2023-02-11 MED ORDER — SODIUM CHLORIDE 0.9% IV SOLUTION: Freq: Once | INTRAVENOUS | Status: AC

## 2023-02-11 MED ORDER — POTASSIUM CHLORIDE 10 MEQ/100ML IV SOLN
10.0000 meq | INTRAVENOUS | Status: AC
  Administered 2023-02-11 (×3): 10 meq via INTRAVENOUS
  Filled 2023-02-11 (×3): qty 100

## 2023-02-11 NOTE — Plan of Care (Signed)
  Problem: Health Behavior/Discharge Planning: Goal: Ability to manage health-related needs will improve Outcome: Progressing   Problem: Clinical Measurements: Goal: Will remain free from infection Outcome: Progressing Goal: Diagnostic test results will improve Outcome: Progressing Goal: Respiratory complications will improve Outcome: Progressing   

## 2023-02-11 NOTE — Plan of Care (Signed)
  Problem: Cardiac: Goal: Ability to achieve and maintain adequate cardiopulmonary perfusion will improve Outcome: Progressing   Problem: Education: Goal: Knowledge of General Education information will improve Description: Including pain rating scale, medication(s)/side effects and non-pharmacologic comfort measures Outcome: Progressing   Problem: Elimination: Goal: Will not experience complications related to urinary retention Outcome: Progressing   Problem: Pain Managment: Goal: General experience of comfort will improve and/or be controlled Outcome: Progressing   Problem: Safety: Goal: Ability to remain free from injury will improve Outcome: Progressing   Problem: Nutrition: Goal: Risk of aspiration will decrease Outcome: Progressing

## 2023-02-11 NOTE — Progress Notes (Addendum)
[name].   DOB:March 29, 1935   WU#:[id number]      ASSESSMENT & PLAN:  1.  Acute anemia - Likely secondary to sepsis.   - Hemoglobin low 5.9 today.  Agree with plan to transfuse PRBC today. - Status post multiple PRBC transfusions this admission. - Recommend PRBC transfusion for hemoglobin <7.0. - Blood smear reviewed - Monitor CBC with differential closely. - Medical oncology/Dr. Truett Perna following closely.  Patient will be followed up as an outpatient upon discharge.  2.  Thrombocytopenia LUE hematoma noted on 02/08/2023 - Mild thrombocytopenia could be secondary to MDS. - Platelets somewhat improving at 67K today. - Patient noted with dried blood around mouth and oral cavity.  Multiple large ecchymotic areas on bilateral upper extremities, left worse than right. - Transfuse platelets for counts <20K or <50K with active bleeding  3.  Urosepsis - Status post cystoscopy and right ureteral stent placement on 02/07/2023. - Continue antibiotics as ordered - WBC 12.8, trending down from 1 day ago. - Continue antibiotics as ordered - Monitor fever curve -Urology following closely  4.  Acute ischemic stroke Acute hypoxic respiratory failure - Patient with new onset left-sided weakness.  Code stroke was called and patient had MRI brain on 2 1 that showed small acute left frontal and left temporal white matter infarct.  Neurology following. - Patient is not currently on supplemental O2.  On aspiration precautions. - Being monitored closely in critical care   5.  A-fib - Was on anticoagulation with Coumadin.  Held at this time due to bleed. -Cardiology managing anticoagulation.   Code Status DNR-limited    Subjective:  Patient seen asleep.  Difficulty arousing to verbal stimuli.  Appears weak and ill appearing.  No supplemental oxygen is on at this time.  Breathing comfortably, in no acute distress.  No acute events overnight noted.  Objective:  Vitals:   02/11/23 0600  02/11/23 0800  BP: 134/61   Pulse: 72   Resp: (!) 21   Temp:  98.4 F (36.9 C)  SpO2: 96% 94%     Intake/Output Summary (Last 24 hours) at 02/11/2023 0935 Last data filed at 02/11/2023 2956 Gross per 24 hour  Intake 3665.3 ml  Output 2550 ml  Net 1115.3 ml     REVIEW OF SYSTEMS: Unable to fully obtain Constitutional: Denies fevers, chills or abnormal night sweats Eyes: Denies blurriness of vision, double vision or watery eyes Ears, nose, mouth, throat, and face: Denies mucositis or sore throat Respiratory: Denies cough, dyspnea or wheezes Cardiovascular: Denies palpitation, chest discomfort or lower extremity swelling Gastrointestinal:  Denies nausea, heartburn or change in bowel habits Skin: Denies abnormal skin rashes Lymphatics: Denies new lymphadenopathy or easy bruising Neurological: Denies numbness, tingling or new weaknesses Behavioral/Psych: Mood is stable, no new changes  All other systems were reviewed with the patient and are negative.  PHYSICAL EXAMINATION: ECOG PERFORMANCE STATUS: 4 - Bedbound  Vitals:   02/11/23 0600 02/11/23 0800  BP: 134/61   Pulse: 72   Resp: (!) 21   Temp:  98.4 F (36.9 C)  SpO2: 96% 94%   Filed Weights   02/09/23 0500 02/10/23 0701 02/11/23 0500  Weight: 171 lb 4.8 oz (77.7 kg) 171 lb 4.8 oz (77.7 kg) 181 lb 3.5 oz (82.2 kg)    GENERAL: +Ill-appearing +weak +frail +somnolent SKIN: +Pale skin color, +large multiple ecchymotic areas on bilateral upper extremities  EYES: normal, conjunctiva are pink and non-injected, sclera clear OROPHARYNX: no exudate, no erythema and lips,  buccal mucosa, and tongue normal  NECK: supple, thyroid normal size, non-tender, without nodularity LYMPH: no palpable lymphadenopathy in the cervical, axillary or inguinal LUNGS: clear to auscultation and percussion with normal breathing effort HEART: regular rate & rhythm and no murmurs and no lower extremity edema ABDOMEN: abdomen soft, non-tender and normal  bowel sounds MUSCULOSKELETAL: no cyanosis of digits and no clubbing  PSYCH: +Somnolent NEURO: no focal motor/sensory deficits   All questions were answered. The patient knows to call the clinic with any problems, questions or concerns.   The total time spent in the appointment was 40 minutes encounter with patient including review of chart and various tests results, discussions about plan of care and coordination of care plan  Dawson Bills, NP 02/11/2023 9:35 AM    Labs Reviewed:  Lab Results  Component Value Date   WBC 12.8 (H) 02/11/2023   HGB 5.9 (LL) 02/11/2023   HCT 19.0 (L) 02/11/2023   MCV 95.5 02/11/2023   PLT 67 (L) 02/11/2023   Recent Labs    02/03/23 0402 02/03/23 2348 02/04/23 0814 02/07/23 1717 02/08/23 0620 02/09/23 0443 02/09/23 0702 02/10/23 0453 02/11/23 0523  NA 140 145   < > 154* 145   < > 158* 157* 141  K 4.7 5.3*   < > 4.9 3.6   < > 4.2 3.6 3.0*  CL 107 110   < > 123* 113*   < > 123* 124* 112*  CO2 21* 22   < > 22 23   < > 26 25 22   GLUCOSE 184* 139*   < > 314* 484*   < > 193* 175* 526*  BUN 40* 63*   < > 98* 80*   < > 66* 53* 41*  CREATININE 1.47* 2.51*   < > 2.33* 1.83*   < > 1.42* 1.31* 1.37*  CALCIUM 8.7* 8.6*   < > 8.2* 7.7*   < > 8.8* 8.9 7.3*  GFRNONAA 46* 24*   < > 27* 36*   < > 48* 53* 51*  PROT 6.9 7.2  --   --  5.2*  --   --   --   --   ALBUMIN 3.4* 3.4*  --  2.2* 2.1*  --   --   --   --   AST 27 56*  --   --  48*  --   --   --   --   ALT 16 23  --   --  33  --   --   --   --   ALKPHOS 40 52  --   --  106  --   --   --   --   BILITOT 1.4* 2.1*  --   --  1.2  --   --   --   --    < > = values in this interval not displayed.    Studies Reviewed:  VAS US CAROTID Result Date: 02/10/2023 Carotid Arterial Duplex Study Patient Name:  Kou Gucciardo.  Date of Exam:   02/10/2023 Medical Rec #: [id number]              Accession #:    [id number] Date of Birth: 11/20/1935              Patient Gender: M Patient Age:   56 years Exam Location:   [facility] Procedure:      VAS US CAROTID Referring Phys: Brooke Dare --------------------------------------------------------------------------------  Indications:  CVA. Risk Factors:      Hypertension, coronary artery disease, prior CVA. Other Factors:     AVR 2012, afib, pulmonary hypertension, mitral and tricuspid                    valve repair. Comparison Study:  Prior carotid duplex done 01/20/18 indicating 1-39% ICA                    stenosis, bilaterally Performing Technologist: Fernande Bras  Examination Guidelines: A complete evaluation includes B-mode imaging, spectral Doppler, color Doppler, and power Doppler as needed of all accessible portions of each vessel. Bilateral testing is considered an integral part of a complete examination. Limited examinations for reoccurring indications may be performed as noted.  Right Carotid Findings: +----------+--------+--------+--------+------------------+------------------+           PSV cm/sEDV cm/sStenosisPlaque DescriptionComments           +----------+--------+--------+--------+------------------+------------------+ CCA Prox  161     28                                intimal thickening +----------+--------+--------+--------+------------------+------------------+ CCA Distal116     14                                intimal thickening +----------+--------+--------+--------+------------------+------------------+ ICA Prox  101     23      1-39%   heterogenous                         +----------+--------+--------+--------+------------------+------------------+ ICA Mid   97      22                                                   +----------+--------+--------+--------+------------------+------------------+ ICA Distal74      19                                                   +----------+--------+--------+--------+------------------+------------------+ ECA       118     12                                                    +----------+--------+--------+--------+------------------+------------------+ +----------+--------+-------+--------+-------------------+           PSV cm/sEDV cmsDescribeArm Pressure (mmHG) +----------+--------+-------+--------+-------------------+ [id number]      18                                 +----------+--------+-------+--------+-------------------+ +---------+--------+---+--------+--+ VertebralPSV cm/s104EDV cm/s21 +---------+--------+---+--------+--+  Left Carotid Findings: +----------+--------+--------+--------+------------------+------------------+           PSV cm/sEDV cm/sStenosisPlaque DescriptionComments           +----------+--------+--------+--------+------------------+------------------+ CCA Prox  115     17  intimal thickening +----------+--------+--------+--------+------------------+------------------+ CCA Distal112     22                                intimal thickening +----------+--------+--------+--------+------------------+------------------+ ICA Prox  134     26      1-39%   heterogenous                         +----------+--------+--------+--------+------------------+------------------+ ICA Mid   125     24                                                   +----------+--------+--------+--------+------------------+------------------+ ICA Distal122     32                                                   +----------+--------+--------+--------+------------------+------------------+ ECA       138     12                                                   +----------+--------+--------+--------+------------------+------------------+ +----------+--------+--------+--------+-------------------+           PSV cm/sEDV cm/sDescribeArm Pressure (mmHG) +----------+--------+--------+--------+-------------------+ [id number]     19                                   +----------+--------+--------+--------+-------------------+ +---------+--------+--+--------+--+ VertebralPSV cm/s74EDV cm/s15 +---------+--------+--+--------+--+   Summary: Right Carotid: Velocities in the right ICA are consistent with a 1-39% stenosis. Left Carotid: Velocities in the left ICA are consistent with a 1-39% stenosis. Vertebrals:  Bilateral vertebral arteries demonstrate antegrade flow. Subclavians: Normal flow hemodynamics were seen in bilateral subclavian              arteries. *See table(s) above for measurements and observations.  Electronically signed by Lemar Livings MD on 02/10/2023 at 4:01:14 PM.    Final    DG CHEST PORT 1 VIEW Result Date: 02/10/2023 CLINICAL DATA:  Respiratory failure EXAM: PORTABLE CHEST 1 VIEW COMPARISON:  02/05/2023 FINDINGS: Right-sided PICC line terminates at the superior caval/atrial junction. The low left hemithorax is minimally excluded. Median sternotomy for aortic valve repair. Left atrial appendage occlusion device. Midline trachea. Mild cardiomegaly. No right and no definite left pleural effusion. No pneumothorax. Left base scarring or subsegmental atelectasis. No lobar consolidation. IMPRESSION: Cardiomegaly without congestive failure. Electronically Signed   By: Jeronimo Greaves M.D.   On: 02/10/2023 09:22   MR ANGIO HEAD WO CONTRAST Result Date: 02/10/2023 CLINICAL DATA:  Stroke/TIA, determine embolic source EXAM: MRA HEAD WITHOUT CONTRAST TECHNIQUE: Angiographic images of the Circle of Willis were acquired using MRA technique without intravenous contrast. COMPARISON:  None Available. FINDINGS: Anterior circulation: Bilateral intracranial ICAs, MCAs and ACAs are patent without proximal hemodynamically significant stenosis. Posterior circulation: Bilateral intradural vertebral arteries and basilar artery are patent without significant stenosis. Severe stenosis of the right P2 PCA. Left PCA is patent without significant stenosis IMPRESSION: Severe stenosis of  the right P2 PCA. Electronically Signed   By: Feliberto Harts M.D.   On: 02/10/2023 00:03   [name] Result Date: 02/09/2023 CLINICAL DATA:  Neuro deficit, acute, stroke suspected. EXAM: MRI HEAD WITHOUT CONTRAST TECHNIQUE: Multiplanar, multiecho pulse sequences of the brain and surrounding structures were obtained without intravenous contrast. COMPARISON:  Head CT 02/09/2023 and MRI 11/09/2021 FINDINGS: Brain: There are small acute infarcts involving subcortical white matter in the posterior left frontal lobe and posterior left temporal lobe. A few small chronic cerebral and cerebellar microhemorrhages are unchanged from the prior MRI. Moderate-sized chronic right MCA and small chronic right cerebellar infarcts are again noted with ex vacuo dilatation of the right lateral ventricle. There is a background of mild chronic small vessel ischemia in the cerebral white matter which is similar to the prior MRI. There is moderate cerebral atrophy with prominent extra-axial CSF over the right greater than left cerebral convexities being unchanged from the prior MRI and attributed to atrophy and subarachnoid space enlargement. No mass, midline shift, or definite extra-axial fluid collection is identified. Vascular: Major intracranial vascular flow voids are preserved. Skull and upper cervical spine: Unremarkable bone marrow signal. Sinuses/Orbits: Bilateral cataract extraction. Clear paranasal sinuses. Large chronic right mastoid effusion. Other: None. IMPRESSION: 1. Small acute left frontal and left temporal white matter infarcts. 2. Chronic ischemia including old right MCA and right cerebellar infarcts. Electronically Signed   By: Sebastian Ache M.D.   On: 02/09/2023 15:05   CT HEAD CODE STROKE WO CONTRAST Result Date: 02/09/2023 CLINICAL DATA:  Code stroke. Neuro deficit, acute, stroke suspected. EXAM: CT HEAD WITHOUT CONTRAST TECHNIQUE: Contiguous axial images were obtained from the base of the skull  through the vertex without intravenous contrast. RADIATION DOSE REDUCTION: This exam was performed according to the departmental dose-optimization program which includes automated exposure control, adjustment of the mA and/or kV according to patient size and/or use of iterative reconstruction technique. COMPARISON:  Head CT 02/16/2022 and MRI 11/09/2021 FINDINGS: Brain: There is no evidence of an acute cortically based infarct, intracranial hemorrhage, mass, midline shift, or extra-axial fluid collection. A moderate-sized chronic right MCA infarct is unchanged with involvement of the operculum, insula, and basal ganglia and associated ex vacuo dilatation of the body and frontal horn of the right lateral ventricle. A new 7 mm discrete hypodense focus in the subcortical white matter of the posterior left frontal lobe is suggestive of an age indeterminate lacunar infarct. Hypodensities elsewhere in the cerebral white matter bilaterally are unchanged and nonspecific but compatible with mild chronic small vessel ischemic disease. A small chronic right cerebellar infarct is unchanged. There is moderate cerebral atrophy. Enlarged extra-axial CSF spaces over the frontal convexities are unchanged and attributed to atrophy. Vascular: Calcified atherosclerosis at the skull base. No hyperdense vessel. Skull: No acute fracture or suspicious osseous lesion. Sinuses/Orbits: Visualized paranasal sinuses are clear. Large chronic right mastoid effusion. Bilateral cataract extraction. Other: None. ASPECTS ([place]berta Stroke Program Early CT Score) - Ganglionic level infarction (caudate, lentiform nuclei, internal capsule, insula, M1-M3 cortex): 7 - Supraganglionic infarction (M4-M6 cortex): 3 Total score (0-10 with 10 being normal): 10 These results were communicated to Dr. Iver Nestle at 1:30 pm on 02/09/2023 by text page via the [facility] messaging system. IMPRESSION: 1. No evidence of acute cortically based infarct or intracranial hemorrhage.  ASPECTS of 10. 2. Age indeterminate left frontal white matter lacunar infarct. 3. Chronic ischemia including an old right MCA infarct. Electronically Signed   By: Jolaine Click.D.  On: 02/09/2023 13:31   DG C-Arm 1-60 Min-No Report Result Date: 02/07/2023 Fluoroscopy was utilized by the requesting physician.  No radiographic interpretation.   CT RENAL STONE STUDY Result Date: 02/06/2023 CLINICAL DATA:  Abdominal/flank pain.  Concern for kidney stone. EXAM: CT ABDOMEN AND PELVIS WITHOUT CONTRAST TECHNIQUE: Multidetector CT imaging of the abdomen and pelvis was performed following the standard protocol without IV contrast. RADIATION DOSE REDUCTION: This exam was performed according to the departmental dose-optimization program which includes automated exposure control, adjustment of the mA and/or kV according to patient size and/or use of iterative reconstruction technique. COMPARISON:  CT abdomen pelvis dated 09/23/2021. FINDINGS: Evaluation of this exam is limited in the absence of intravenous contrast. Lower chest: Small bilateral pleural effusions with associated partial compressive atelectasis of the lung bases. Mild cardiomegaly. Tricuspid mechanical bowel. Coronary vascular calcification. No intra-abdominal free air or free fluid. Hepatobiliary: The liver is unremarkable. No biliary dilatation. Cholecystectomy. No retained calcified stone noted in the central CBD. Pancreas: Unremarkable. No pancreatic ductal dilatation or surrounding inflammatory changes. Spleen: Normal in size without focal abnormality. Adrenals/Urinary Tract: The adrenal glands unremarkable. There is a 12 mm stone in the proximal right ureter close to the ureteropelvic junction with mild right hydronephrosis. Several additional nonobstructing right renal calculi measure up to 1 cm in the interpolar aspect of the right kidney. Multiple right renal cysts are poorly characterized on this noncontrast CT but were present on the prior CT. A  12 mm high attenuating exophytic cyst from the inferior pole of the right kidney likely represents a hemorrhagic or proteinaceous cyst. There has been interval development of high attenuating content within a exophytic 8.5 cm cyst arising from the inferior pole of the right kidney consistent with hemorrhage. The possibility of active lesional is not excluded but cannot be evaluated on this noncontrast CT. No significant perinephric fluid. There is no hydronephrosis or nephrolithiasis on the left. The left ureter is unremarkable. The urinary bladder is decompressed around a Foley catheter. Stomach/Bowel: There is sigmoid diverticulosis. There is no bowel obstruction or active inflammation. The appendix is normal. Vascular/Lymphatic: Mild aortoiliac atherosclerotic disease. The IVC is unremarkable. No portal gas. There is no adenopathy. Reproductive: Mildly enlarged prostate gland measuring 5 cm in transverse axial diameter. The seminal vesicles are symmetric. Other: Midline vertical anterior abdominal wall incisional scar. There is mild asymmetric enlargement of the left iliacus muscle with an ill-defined high attenuating area suspicious for an intramuscular bleed. Slight asymmetric enlargement of the left psoas muscle also noted. Musculoskeletal: Small fat containing midline supraumbilical hernia. There is osteopenia with degenerative changes of the spine. No acute osseous pathology. Total left hip arthroplasty. IMPRESSION: 1. A 12 mm proximal right ureteral stone with mild right hydronephrosis. 2. Additional nonobstructing right renal calculi. 3. Hemorrhage within the dominant 8.5 cm cyst in the inferior pole of the right kidney. Active bleed or underlying malignancy is not excluded but cannot be evaluated on this noncontrast CT. Urology consult and follow-up recommended to exclude a bleeding mass. 4. Small intramuscular bleed within the left iliopsoas. Correlation with anticoagulation status recommended. 5.  Sigmoid diverticulosis. No bowel obstruction. Normal appendix. 6. Small bilateral pleural effusions with associated partial compressive atelectasis of the lung bases. 7.  Aortic Atherosclerosis (ICD10-I70.0). Electronically Signed   By: Elgie Collard M.D.   On: 02/06/2023 10:54   DG CHEST PORT 1 VIEW Result Date: 02/05/2023 CLINICAL DATA:  PICC placement. EXAM: PORTABLE CHEST 1 VIEW COMPARISON:  Chest radiograph dated 02/04/2023. FINDINGS: Right-sided  PICC with tip over central SVC. Improved aeration of the lungs compared to prior radiograph. No focal consolidation or pneumothorax. Trace bilateral pleural effusions suspected. Stable cardiac silhouette. Median sternotomy wires and mechanical cardiac valve. No acute osseous pathology. IMPRESSION: 1. Right-sided PICC with tip over central SVC. 2. Improved aeration of the lungs. Electronically Signed   By: Elgie Collard M.D.   On: 02/05/2023 15:12   [place] EKG SITE RITE Result Date: 02/05/2023 If Site Rite image not attached, placement could not be confirmed due to current cardiac rhythm.  DG CHEST PORT 1 VIEW Result Date: 02/04/2023 CLINICAL DATA:  Shortness of breath. EXAM: PORTABLE CHEST 1 VIEW COMPARISON:  Chest radiograph dated 02/03/2023. FINDINGS: There is cardiomegaly with vascular congestion. No focal consolidation, pleural effusion, pneumothorax. Median sternotomy wires and mechanical cardiac valve. No acute osseous pathology. IMPRESSION: Cardiomegaly with vascular congestion. Electronically Signed   By: Elgie Collard M.D.   On: 02/04/2023 14:25   DG Abd 1 View Result Date: 02/04/2023 CLINICAL DATA:  Nausea and vomiting. EXAM: ABDOMEN - 1 VIEW COMPARISON:  02/03/2023 FINDINGS: Nasogastric tube tip is in the left upper abdomen and likely in the gastric body region. Bowel gas in the abdomen and pelvis with a nonobstructive pattern. Surgical clips in the right abdomen. Left hip arthroplasty. Surgical clips in the right groin. Two large  calcifications in the region of the right renal shadow both measuring approximately 1 cm. 1 of these calcifications could be in the renal pelvic region. IMPRESSION: 1. Right renal calculi, largest measuring up to 1.0 cm. One stone could be in the renal pelvic region. This could be better evaluated with CT imaging. 2. Nonobstructive bowel gas pattern. 3. Tip of the nasogastric tube is in the gastric body region. Electronically Signed   By: Richarda Overlie M.D.   On: 02/04/2023 10:04   ECHOCARDIOGRAM COMPLETE Result Date: 02/03/2023    ECHOCARDIOGRAM REPORT   Patient Name:   Mordecai Tindol. Date of Exam: 02/03/2023 Medical Rec #:  [id number]             Height:       72.0 in Accession #:    [id number]            Weight:       173.3 lb Date of Birth:  1935/03/25             BSA:          2.005 m Patient Age:    87 years              BP:           91/64 mmHg Patient Gender: M                     HR:           76 bpm. Exam Location:  Inpatient Procedure: 2D Echo, Cardiac Doppler, Color Doppler and Intracardiac            Opacification Agent Indications:    TIA  History:        Patient has prior history of Echocardiogram examinations, most                 recent 03/19/2022. Previous Myocardial Infarction and CAD, COPD,                 Arrythmias:Atrial Fibrillation; Risk Factors:Hypertension.                 Aortic  Valve: bioprosthetic valve is present in the aortic                 position.                 Mitral Valve: prosthetic annuloplasty ring valve is present in                 the mitral position.  Sonographer:    Webb Laws Referring Phys: [id number] DAVID MANUEL ORTIZ IMPRESSIONS  1. S/P MV repair with mean gradient 8 mmHg and no MR; s/p AVR with mean gradient 14 mmHg and no AI; s/p TV repair with mild TR.  2. Left ventricular ejection fraction, by estimation, is 60 to 65%. The left ventricle has normal function. The left ventricle has no regional wall motion abnormalities. There is moderate left ventricular  hypertrophy. Left ventricular diastolic parameters are indeterminate. There is the interventricular septum is flattened in systole and diastole, consistent with right ventricular pressure and volume overload.  3. Right ventricular systolic function is normal. The right ventricular size is mildly enlarged. There is severely elevated pulmonary artery systolic pressure.  4. Left atrial size was severely dilated.  5. Right atrial size was severely dilated.  6. The mitral valve has been repaired/replaced. No evidence of mitral valve regurgitation. No evidence of mitral stenosis. There is a prosthetic annuloplasty ring present in the mitral position.  7. The tricuspid valve is has been repaired/replaced. The tricuspid valve is status post repair with an annuloplasty ring.  8. The aortic valve has been repaired/replaced. Aortic valve regurgitation is not visualized. No aortic stenosis is present. There is a bioprosthetic valve present in the aortic position.  9. The inferior vena cava is dilated in size with <50% respiratory variability, suggesting right atrial pressure of 15 mmHg. FINDINGS  Left Ventricle: Left ventricular ejection fraction, by estimation, is 60 to 65%. The left ventricle has normal function. The left ventricle has no regional wall motion abnormalities. Definity contrast agent was given IV to delineate the left ventricular  endocardial borders. The left ventricular internal cavity size was normal in size. There is moderate left ventricular hypertrophy. The interventricular septum is flattened in systole and diastole, consistent with right ventricular pressure and volume overload. Left ventricular diastolic parameters are indeterminate. Right Ventricle: The right ventricular size is mildly enlarged. Right ventricular systolic function is normal. There is severely elevated pulmonary artery systolic pressure. The tricuspid regurgitant velocity is 3.39 m/s, and with an assumed right atrial  pressure of 15  mmHg, the estimated right ventricular systolic pressure is 61.0 mmHg. Left Atrium: Left atrial size was severely dilated. Right Atrium: Right atrial size was severely dilated. Pericardium: There is no evidence of pericardial effusion. Mitral Valve: The mitral valve has been repaired/replaced. No evidence of mitral valve regurgitation. There is a prosthetic annuloplasty ring present in the mitral position. No evidence of mitral valve stenosis. MV peak gradient, 22.5 mmHg. The mean mitral valve gradient is 8.0 mmHg. Tricuspid Valve: The tricuspid valve is has been repaired/replaced. Tricuspid valve regurgitation is mild . No evidence of tricuspid stenosis. The tricuspid valve is status post repair with an annuloplasty ring. Aortic Valve: The aortic valve has been repaired/replaced. Aortic valve regurgitation is not visualized. No aortic stenosis is present. Aortic valve mean gradient measures 14.2 mmHg. Aortic valve peak gradient measures 17.8 mmHg. Aortic valve area, by VTI measures 1.60 cm. There is a bioprosthetic valve present in the aortic position. Pulmonic Valve: The pulmonic valve was normal  in structure. Pulmonic valve regurgitation is mild. No evidence of pulmonic stenosis. Aorta: The aortic root is normal in size and structure. Venous: The inferior vena cava is dilated in size with less than 50% respiratory variability, suggesting right atrial pressure of 15 mmHg. IAS/Shunts: No atrial level shunt detected by color flow Doppler. Additional Comments: S/P MV repair with mean gradient 8 mmHg and no MR; s/p AVR with mean gradient 14 mmHg and no AI; s/p TV repair with mild TR.  LEFT VENTRICLE PLAX 2D LVIDd:         4.50 cm      Diastology LVIDs:         3.50 cm      LV e' medial:    3.37 cm/s LV PW:         1.20 cm      LV E/e' medial:  59.6 LV IVS:        1.40 cm      LV e' lateral:   7.40 cm/s LVOT diam:     2.10 cm      LV E/e' lateral: 27.2 LV SV:         72 LV SV Index:   36 LVOT Area:     3.46 cm  LV  Volumes (MOD) LV vol d, MOD A2C: 120.0 ml LV vol d, MOD A4C: 98.9 ml LV vol s, MOD A2C: 55.1 ml LV vol s, MOD A4C: 41.1 ml LV SV MOD A2C:     64.9 ml LV SV MOD A4C:     98.9 ml LV SV MOD BP:      60.2 ml RIGHT VENTRICLE             IVC RV Basal diam:  4.65 cm     IVC diam: 3.20 cm RV Mid diam:    4.20 cm RV S prime:     10.30 cm/s TAPSE (M-mode): 1.6 cm LEFT ATRIUM              Index        RIGHT ATRIUM           Index LA diam:        4.70 cm  2.34 cm/m   RA Area:     21.10 cm LA Vol (A2C):   141.0 ml 70.34 ml/m  RA Volume:   54.90 ml  27.39 ml/m LA Vol (A4C):   98.7 ml  49.24 ml/m LA Biplane Vol: 120.0 ml 59.86 ml/m  AORTIC VALVE                     PULMONIC VALVE AV Area (Vmax):    1.99 cm      PR End Diast Vel: 1.85 msec AV Area (Vmean):   1.63 cm AV Area (VTI):     1.60 cm AV Vmax:           211.12 cm/s AV Vmean:          176.800 cm/s AV VTI:            0.452 m AV Peak Grad:      17.8 mmHg AV Mean Grad:      14.2 mmHg LVOT Vmax:         121.00 cm/s LVOT Vmean:        83.000 cm/s LVOT VTI:          0.209 m LVOT/AV VTI ratio: 0.46  AORTA Ao Root diam: 3.30 cm Ao Asc diam:  3.50  cm MITRAL VALVE                TRICUSPID VALVE MV Area (PHT): 1.86 cm     TR Peak grad:   46.0 mmHg MV Area VTI:   1.10 cm     TR Vmax:        339.00 cm/s MV Peak grad:  22.5 mmHg MV Mean grad:  8.0 mmHg     SHUNTS MV Vmax:       2.37 m/s     Systemic VTI:  0.21 m MV Vmean:      132.0 cm/s   Systemic Diam: 2.10 cm MV Decel Time: 408 msec MV E velocity: 201.00 cm/s MV A velocity: 55.60 cm/s MV E/A ratio:  3.62 Olga Millers MD Electronically signed by Olga Millers MD Signature Date/Time: 02/03/2023/3:57:16 PM    Final    DG Abd 1 View Result Date: 02/03/2023 CLINICAL DATA:  Nasogastric tube placement. EXAM: ABDOMEN - 1 VIEW COMPARISON:  Earlier today FINDINGS: The enteric tube has been advanced, the tip and side port are below the diaphragm in the stomach. No bowel dilatation in the upper abdomen. Right upper quadrant  surgical clips. IMPRESSION: Enteric tube tip and side port below the diaphragm in the stomach. Electronically Signed   By: Narda Rutherford M.D.   On: 02/03/2023 15:50   DG Abd 1 View Result Date: 02/03/2023 CLINICAL DATA:  Nausea and vomiting EXAM: ABDOMEN - 1 VIEW; PORTABLE ABDOMEN - 1 VIEW COMPARISON:  None Available. FINDINGS: Sequential images of the upper abdomen demonstrate placement of an esophagogastric tube, tip below the diaphragm, side port above the gastroesophageal junction. Nonobstructive pattern of included bowel gas. No free air on supine radiographs. Cardiomegaly status post median sternotomy with valvular prosthesis and left atrial appendage clip. IMPRESSION: 1. Sequential images of the upper abdomen demonstrate placement of an esophagogastric tube, tip below the diaphragm, side port above the gastroesophageal junction. Recommend advancement. 2. Nonobstructive pattern of included bowel gas. Electronically Signed   By: Jearld Lesch M.D.   On: 02/03/2023 13:22   DG Abd Portable 1V Result Date: 02/03/2023 CLINICAL DATA:  Nausea and vomiting EXAM: ABDOMEN - 1 VIEW; PORTABLE ABDOMEN - 1 VIEW COMPARISON:  None Available. FINDINGS: Sequential images of the upper abdomen demonstrate placement of an esophagogastric tube, tip below the diaphragm, side port above the gastroesophageal junction. Nonobstructive pattern of included bowel gas. No free air on supine radiographs. Cardiomegaly status post median sternotomy with valvular prosthesis and left atrial appendage clip. IMPRESSION: 1. Sequential images of the upper abdomen demonstrate placement of an esophagogastric tube, tip below the diaphragm, side port above the gastroesophageal junction. Recommend advancement. 2. Nonobstructive pattern of included bowel gas. Electronically Signed   By: Jearld Lesch M.D.   On: 02/03/2023 13:22   DG CHEST PORT 1 VIEW Result Date: 02/03/2023 CLINICAL DATA:  Hypoxia, status post aortic valve replacement EXAM:  PORTABLE CHEST 1 VIEW COMPARISON:  02/02/2023 FINDINGS: Cardiomegaly status post median sternotomy with aortic valve prosthesis and left atrial appendage clip. Similar retrocardiac atelectasis. Mild diffuse interstitial opacity. No acute osseous findings. IMPRESSION: 1. Cardiomegaly status post median sternotomy with aortic valve prosthesis and left atrial appendage clip. 2. Similar retrocardiac atelectasis. Mild diffuse interstitial opacity, consistent with edema. Electronically Signed   By: Jearld Lesch M.D.   On: 02/03/2023 12:22   DG Chest Portable 1 View Result Date: 02/02/2023 CLINICAL DATA:  Shortness of breath. EXAM: PORTABLE CHEST 1 VIEW COMPARISON:  02/16/2022 FINDINGS: The cardio  pericardial silhouette is enlarged. Diffuse interstitial opacity suggests edema. No focal lung consolidation. No substantial pleural effusion. Bones are diffusely demineralized. Telemetry leads overlie the chest. IMPRESSION: Enlargement of the cardiopericardial silhouette with diffuse interstitial opacity suggesting edema. Electronically Signed   By: Kennith Center M.D.   On: 02/02/2023 08:21  Mr. Losee was interviewed and examined.  He suffered an acute CVA over the weekend.  He was alert when I saw him earlier this morning.  Skin bleeding appears improved.  The platelet count is recovering from the recent episode of sepsis.  The hemoglobin remains low secondary to sepsis, bleeding, renal failure, and phlebotomy.  I expect the hemoglobin to improve over the next several weeks.  Recommendations:  Transfuse packed red blood cells today Anticoagulation per cardiology and neurology Continue post sepsis care per the medical service Please call hematology as needed, outpatient follow-up as scheduled in the hematology clinic

## 2023-02-11 NOTE — Progress Notes (Signed)
4 Days Post-Op Subjective: Unfortunately [name] is situation has been complicated by a stroke over the weekend.  He remains in the ICU with questionable situational awareness.  No acute events overnight.  I had a long conversation with his son [name] about goals of care.  He stated that his father has a living well and he would not want to go on his way, particularly now that his speech is slurred and he seems to have mobility deficits that would prevent him from being able to do a few things that he still enjoys.  [name] stated that he was waiting to see what his new baseline would be after his stroke.  I let him know that it would be quite sometime before his new normal was established, though may be a bit of a moving target with his concurrently declining health.  I think they are leaning towards transitioning back to comfort measures.    Objective: Vital signs in last 24 hours: Temp:  [98.3 F (36.8 C)-100.1 F (37.8 C)] 98.4 F (36.9 C) (02/03 0800) Pulse Rate:  [60-81] 72 (02/03 0600) Resp:  [17-28] 21 (02/03 0600) BP: (125-160)/(48-61) 134/61 (02/03 0600) SpO2:  [94 %-100 %] 94 % (02/03 0800) FiO2 (%):  [21 %] 21 % (02/03 0100) Weight:  [82.2 kg] 82.2 kg (02/03 0500)  Assessment/Plan: # Obstructing right ureteral calculus #AKI  12 mm right proximal ureteral calculus confirmed on CT A/P on 02/06/2023 To the OR with Dr. Marlou Porch on 02/07/2023. Definative stone mgmt on an outpt basis.  Admitted to the ICU for urosepsis where he has remained. Trend labs.  Patient has made incremental improvements in his renal indices daily.  Serum creatinine is 1.37 as of this morning. Urology will follow along peripherally pending goals of care conversations with family.    Intake/Output from previous day: 02/02 0701 - 02/03 0700 In: 2648.8 [I.V.:2448.8; IV Piggyback:200] Out: 2550 [Urine:2550]  Intake/Output this shift: Total I/O In: 1791.8 [I.V.:1600.8; IV Piggyback:190.9] Out:  375 [Urine:375]  Physical Exam:  General: Alert and oriented CV: No cyanosis Lungs: equal chest rise Abdomen: Soft, NTND, no rebound or guarding Gu: 25f foley in place draining clear yellow urine  Lab Results: Recent Labs    02/09/23 0443 02/10/23 0453 02/11/23 0730  HGB 7.6* 7.2* 5.9*  HCT 23.8* 22.6* 19.0*   BMET Recent Labs    02/10/23 0453 02/11/23 0523  NA 157* 141  K 3.6 3.0*  CL 124* 112*  CO2 25 22  GLUCOSE 175* 526*  BUN 53* 41*  CREATININE 1.31* 1.37*  CALCIUM 8.9 7.3*     Studies/Results: VAS US CAROTID Result Date: 02/10/2023 Carotid Arterial Duplex Study Patient Name:  [name].  Date of Exam:   02/10/2023 Medical Rec #: [id number]              Accession #:    [id number] Date of Birth: December 06, 1935              Patient Gender: M Patient Age:   88 years Exam Location:  [facility] Procedure:      VAS US CAROTID Referring Phys: Brooke Dare --------------------------------------------------------------------------------  Indications:       CVA. Risk Factors:      Hypertension, coronary artery disease, prior CVA. Other Factors:     AVR 2012, afib, pulmonary hypertension, mitral and tricuspid                    valve repair. Comparison Study:  Prior carotid duplex done 01/20/18 indicating 1-39% ICA                    stenosis, bilaterally Performing Technologist: Fernande Bras  Examination Guidelines: A complete evaluation includes B-mode imaging, spectral Doppler, color Doppler, and power Doppler as needed of all accessible portions of each vessel. Bilateral testing is considered an integral part of a complete examination. Limited examinations for reoccurring indications may be performed as noted.  Right Carotid Findings: +----------+--------+--------+--------+------------------+------------------+           PSV cm/sEDV cm/sStenosisPlaque DescriptionComments            +----------+--------+--------+--------+------------------+------------------+ CCA Prox  161     28                                intimal thickening +----------+--------+--------+--------+------------------+------------------+ CCA Distal116     14                                intimal thickening +----------+--------+--------+--------+------------------+------------------+ ICA Prox  101     23      1-39%   heterogenous                         +----------+--------+--------+--------+------------------+------------------+ ICA Mid   97      22                                                   +----------+--------+--------+--------+------------------+------------------+ ICA Distal74      19                                                   +----------+--------+--------+--------+------------------+------------------+ ECA       118     12                                                   +----------+--------+--------+--------+------------------+------------------+ +----------+--------+-------+--------+-------------------+           PSV cm/sEDV cmsDescribeArm Pressure (mmHG) +----------+--------+-------+--------+-------------------+ [id number]      18                                 +----------+--------+-------+--------+-------------------+ +---------+--------+---+--------+--+ VertebralPSV cm/s104EDV cm/s21 +---------+--------+---+--------+--+  Left Carotid Findings: +----------+--------+--------+--------+------------------+------------------+           PSV cm/sEDV cm/sStenosisPlaque DescriptionComments           +----------+--------+--------+--------+------------------+------------------+ CCA Prox  115     17                                intimal thickening +----------+--------+--------+--------+------------------+------------------+ CCA Distal112     22                                intimal thickening  +----------+--------+--------+--------+------------------+------------------+  ICA Prox  134     26      1-39%   heterogenous                         +----------+--------+--------+--------+------------------+------------------+ ICA Mid   125     24                                                   +----------+--------+--------+--------+------------------+------------------+ ICA Distal122     32                                                   +----------+--------+--------+--------+------------------+------------------+ ECA       138     12                                                   +----------+--------+--------+--------+------------------+------------------+ +----------+--------+--------+--------+-------------------+           PSV cm/sEDV cm/sDescribeArm Pressure (mmHG) +----------+--------+--------+--------+-------------------+ [id number]     19                                  +----------+--------+--------+--------+-------------------+ +---------+--------+--+--------+--+ VertebralPSV cm/s74EDV cm/s15 +---------+--------+--+--------+--+   Summary: Right Carotid: Velocities in the right ICA are consistent with a 1-39% stenosis. Left Carotid: Velocities in the left ICA are consistent with a 1-39% stenosis. Vertebrals:  Bilateral vertebral arteries demonstrate antegrade flow. Subclavians: Normal flow hemodynamics were seen in bilateral subclavian              arteries. *See table(s) above for measurements and observations.  Electronically signed by Lemar Livings MD on 02/10/2023 at 4:01:14 PM.    Final    DG CHEST PORT 1 VIEW Result Date: 02/10/2023 CLINICAL DATA:  Respiratory failure EXAM: PORTABLE CHEST 1 VIEW COMPARISON:  02/05/2023 FINDINGS: Right-sided PICC line terminates at the superior caval/atrial junction. The low left hemithorax is minimally excluded. Median sternotomy for aortic valve repair. Left atrial appendage occlusion device. Midline trachea.  Mild cardiomegaly. No right and no definite left pleural effusion. No pneumothorax. Left base scarring or subsegmental atelectasis. No lobar consolidation. IMPRESSION: Cardiomegaly without congestive failure. Electronically Signed   By: Jeronimo Greaves M.D.   On: 02/10/2023 09:22   MR ANGIO HEAD WO CONTRAST Result Date: 02/10/2023 CLINICAL DATA:  Stroke/TIA, determine embolic source EXAM: MRA HEAD WITHOUT CONTRAST TECHNIQUE: Angiographic images of the Circle of Willis were acquired using MRA technique without intravenous contrast. COMPARISON:  None Available. FINDINGS: Anterior circulation: Bilateral intracranial ICAs, MCAs and ACAs are patent without proximal hemodynamically significant stenosis. Posterior circulation: Bilateral intradural vertebral arteries and basilar artery are patent without significant stenosis. Severe stenosis of the right P2 PCA. Left PCA is patent without significant stenosis IMPRESSION: Severe stenosis of the right P2 PCA. Electronically Signed   By: Feliberto Harts M.D.   On: 02/10/2023 00:03   [name] Result Date: 02/09/2023 CLINICAL DATA:  Neuro deficit, acute, stroke suspected. EXAM: MRI HEAD WITHOUT CONTRAST TECHNIQUE: Multiplanar, multiecho pulse  sequences of the brain and surrounding structures were obtained without intravenous contrast. COMPARISON:  Head CT 02/09/2023 and MRI 11/09/2021 FINDINGS: Brain: There are small acute infarcts involving subcortical white matter in the posterior left frontal lobe and posterior left temporal lobe. A few small chronic cerebral and cerebellar microhemorrhages are unchanged from the prior MRI. Moderate-sized chronic right MCA and small chronic right cerebellar infarcts are again noted with ex vacuo dilatation of the right lateral ventricle. There is a background of mild chronic small vessel ischemia in the cerebral white matter which is similar to the prior MRI. There is moderate cerebral atrophy with prominent extra-axial CSF  over the right greater than left cerebral convexities being unchanged from the prior MRI and attributed to atrophy and subarachnoid space enlargement. No mass, midline shift, or definite extra-axial fluid collection is identified. Vascular: Major intracranial vascular flow voids are preserved. Skull and upper cervical spine: Unremarkable bone marrow signal. Sinuses/Orbits: Bilateral cataract extraction. Clear paranasal sinuses. Large chronic right mastoid effusion. Other: None. IMPRESSION: 1. Small acute left frontal and left temporal white matter infarcts. 2. Chronic ischemia including old right MCA and right cerebellar infarcts. Electronically Signed   By: Sebastian Ache M.D.   On: 02/09/2023 15:05   CT HEAD CODE STROKE WO CONTRAST Result Date: 02/09/2023 CLINICAL DATA:  Code stroke. Neuro deficit, acute, stroke suspected. EXAM: CT HEAD WITHOUT CONTRAST TECHNIQUE: Contiguous axial images were obtained from the base of the skull through the vertex without intravenous contrast. RADIATION DOSE REDUCTION: This exam was performed according to the departmental dose-optimization program which includes automated exposure control, adjustment of the mA and/or kV according to patient size and/or use of iterative reconstruction technique. COMPARISON:  Head CT 02/16/2022 and MRI 11/09/2021 FINDINGS: Brain: There is no evidence of an acute cortically based infarct, intracranial hemorrhage, mass, midline shift, or extra-axial fluid collection. A moderate-sized chronic right MCA infarct is unchanged with involvement of the operculum, insula, and basal ganglia and associated ex vacuo dilatation of the body and frontal horn of the right lateral ventricle. A new 7 mm discrete hypodense focus in the subcortical white matter of the posterior left frontal lobe is suggestive of an age indeterminate lacunar infarct. Hypodensities elsewhere in the cerebral white matter bilaterally are unchanged and nonspecific but compatible with mild  chronic small vessel ischemic disease. A small chronic right cerebellar infarct is unchanged. There is moderate cerebral atrophy. Enlarged extra-axial CSF spaces over the frontal convexities are unchanged and attributed to atrophy. Vascular: Calcified atherosclerosis at the skull base. No hyperdense vessel. Skull: No acute fracture or suspicious osseous lesion. Sinuses/Orbits: Visualized paranasal sinuses are clear. Large chronic right mastoid effusion. Bilateral cataract extraction. Other: None. ASPECTS ([place] Stroke Program Early CT Score) - Ganglionic level infarction (caudate, lentiform nuclei, internal capsule, insula, M1-M3 cortex): 7 - Supraganglionic infarction (M4-M6 cortex): 3 Total score (0-10 with 10 being normal): 10 These results were communicated to Dr. Iver Nestle at 1:30 pm on 02/09/2023 by text page via the [facility] messaging system. IMPRESSION: 1. No evidence of acute cortically based infarct or intracranial hemorrhage. ASPECTS of 10. 2. Age indeterminate left frontal white matter lacunar infarct. 3. Chronic ischemia including an old right MCA infarct. Electronically Signed   By: Sebastian Ache M.D.   On: 02/09/2023 13:31      LOS: 8 days   Elmon Kirschner, NP [facility] Pager: [phone]  02/11/2023, 12:13 PM

## 2023-02-11 NOTE — Progress Notes (Signed)
Progress Note   Patient: [name]. ZOX:[id number] DOB: 01-29-1935 DOA: 02/02/2023     8 DOS: the patient was seen and examined on 02/11/2023   Brief hospital course: [name]. is a 88 y.o. male who has an extensive PMH as below, significant for CAD, CVA, aortic valve replacement 2012, hypertension, pulmonary hypertension, atrial fibrillation and small bowel obstruction  . He presented to [facility] ED 1/25 with dyspnea that woke him from his sleep the night prior. He had also complained of LE edema over the preceding few weeks. On EMS arrival, he had sats of 85% on room air. He was placed on [place] O2 and given Solumedrol and DuoNebs in route to ED.   In ED, CXR was suggestive of mild edema. He was admitted by [facility] and started on diuresis. He was seen by cardiology in consultation who agreed with diuresis for fluid overload and obtaining an echo. Echo was obtained and demonstrated EF 60-65%, mod LVH, RV pressure overload with RVSP 61, LA and RA severely dilated.   On evening of 1/26, he developed nausea and vomiting with probable aspiration event. There was concern for SBO and was seen by general surgery who recommended continuing NPO and continuing NGT to suction.   On 1/27, diuresis was held due to bump in renal function after IV lasix. He was only net negative since admit. He did not appear to have excess volume per cardiology notes and appeared improved from CHF standpoint. Dyspnea and hypoxia were felt to more likely be related to aspiration PNA.  General surgery had also seen and he had benign exam and was passing flatus. He unfortunately pulled out his NGT that day.   After pulling out NGT 1/27, he had episode of tachypnea and increased WOB along with spike in temp to 102.27F. Due to increased WOB, PCCM asked to see in consultation. He had been on 4L [place] prior to this episode.   Blood cultures from admission also returned positive for Enterobacter cloacae. He was on Cefepime  escalated to Meropenem.  Family report concerns of pt aspirating for over a week prior to admit. Further GOC clarified with pt's son, [name]/ HPOA, changed to DNR/ DNI.    Off vasopressors since 1/30. Improving renal function. Hgb down to 6.2, plts 23 overnight, transfused PRBC and plts. Currently no complaints, has multiple concerns about his nutrition/ diet.  Developed left upper extremity hematoma 20-25 cm.  Received blood products.  Assessment and Plan: Acute ischemic stroke: Thought to be related to FFP/platelet infusion MRI brain on 2/1 showed small acute left frontal and left temporal white matter infarct. MR angio showed severe stenosis of right P2 PCA Neurology follow up appreciated. LDL: 23. Per neurology repeat CT head for hemorrhage in 1 to 2 days before restarting anticoagulation,   Acute hypoxic respiratory failure-multifactorial in the setting of aspiration pneumonia, fluid overload/CHF exacerbation: 02/09/2023: Off of oxygen. Continue aspiration precautions.   Seen by SLP- full liquid diet. Continue pulmonary hygiene.   Enterobacter Colace bacteremia in the setting of UTI  right ureteral stone with mild hydronephrosis: Septic shock-resolved 1/30 was not a candidate for percutaneous  nephrostomy tube given elevated INR and thrombocytopenia.  S/p cystoscopy with right ureteral stent placement 1/30 Off of pressors now.  Fever improved. WBC improved. Cefepime changed to Meropenem due to encephalopathy. Continue antibiotics for total 14 days.   Left iliopsoas intramuscular bleed Right renal cyst with hemorrhage  LUE hematoma -noted on 02/08/23.  Associated with  blood pressure cuff.  Status post PRBC transfusion, hb down to 5.9 today another unit transfusion ordered.  Continue to monitor H&H closely.   Ileus  NGT removed 1/27, passing flatus Continues to have liquid BM. Continue rectal tube management   Hypernatremia: Stop the fluids. Sodium 147 today   AKI: Baseline  creatinine 0.9.  Worsened after IV diuresis and shock.  Mild right hydronephrosis with right renal calculi.  Status post stent placement on 1/30 by urology.  Renal function improving.   Afib - on coumadin Valvular disease s/p bioprosthetic AVR 2012, w/ MV and TV repair  CAD, non-obstructive  HFpEF: Remain euvolemic.  Continue to hold on beta-blocker and Coumadin.  Cardiology signed off on 02/06/2023. INR goal is 2-3.   Continue to hold on Coumadin for next 1 to 2 days as above.   Anemia of chronic disease Acute on chronic Thrombocytopenia Coagulapathy on coumadin  Continue to hold on Coumadin.  INR is 1.8 today   Moderate protein calorie malnutrition:  Dietitian on board Encourage oral diet, supplements.  Hyperglycemia- A1c 6.2 Glucose 546 on BMP likely contamination.  Repeat ones around 120. Monitor accucheks, hypoglycemia protocol.     Out of bed to chair. Incentive spirometry. Nursing supportive care. Fall, aspiration precautions. DVT prophylaxis   Code Status: Limited: Do not attempt resuscitation (DNR) -DNR-LIMITED -Do Not Intubate/DNI   Subjective: Patient is seen and examined today morning.  Speech therapist at bedside, cleared him for full liquids.  He denies any complaints, able to answer me appropriately.  Patient is weak.  Physical Exam: Vitals:   02/11/23 0400 02/11/23 0500 02/11/23 0600 02/11/23 0800  BP: (!) 141/59 (!) 135/56 134/61   Pulse: 77 60 72   Resp: 20 (!) 21 (!) 21   Temp: 99.2 F (37.3 C)   98.4 F (36.9 C)  TempSrc: Axillary   Oral  SpO2: 98% 97% 96% 94%  Weight:  82.2 kg    Height:        General - Elderly ill looking Caucasian male, no apparent distress HEENT - PERRLA, EOMI, atraumatic head, non tender sinuses. Lung - Clear, basal rales, diffuse rhonchi, wheezes. Heart - S1, S2 heard, no murmurs, rubs, trace pedal edema. Abdomen - Soft, non tender, rectal tube intact. Neuro - Alert, awake and oriented, follows simple commands. Skin -  Warm and dry.  Data Reviewed:      Latest Ref Rng & Units 02/11/2023    7:30 AM 02/10/2023    4:53 AM 02/09/2023    4:43 AM  CBC  WBC 4.0 - 10.5 K/uL 12.8  15.2  13.1   Hemoglobin 13.0 - 17.0 g/dL 5.9  7.2  7.6   Hematocrit 39.0 - 52.0 % 19.0  22.6  23.8   Platelets 150 - 400 K/uL 67  57  43       Latest Ref Rng & Units 02/11/2023    5:23 AM 02/10/2023    4:53 AM 02/09/2023    7:02 AM  BMP  Glucose 70 - 99 mg/dL 696  295  284   BUN 8 - 23 mg/dL 41  53  66   Creatinine 0.61 - 1.24 mg/dL 1.32  4.40  1.02   Sodium 135 - 145 mmol/L 141  157  158   Potassium 3.5 - 5.1 mmol/L 3.0  3.6  4.2   Chloride 98 - 111 mmol/L 112  124  123   CO2 22 - 32 mmol/L 22  25  26    Calcium  8.9 - 10.3 mg/dL 7.3  8.9  8.8    VAS US CAROTID Result Date: 02/10/2023 Carotid Arterial Duplex Study Patient Name:  Leshaun Biebel.  Date of Exam:   02/10/2023 Medical Rec #: [id number]              Accession #:    [id number] Date of Birth: 1935/04/20              Patient Gender: M Patient Age:   75 years Exam Location:  [facility] Procedure:      VAS US CAROTID Referring Phys: Brooke Dare --------------------------------------------------------------------------------  Indications:       CVA. Risk Factors:      Hypertension, coronary artery disease, prior CVA. Other Factors:     AVR 2012, afib, pulmonary hypertension, mitral and tricuspid                    valve repair. Comparison Study:  Prior carotid duplex done 01/20/18 indicating 1-39% ICA                    stenosis, bilaterally Performing Technologist: Fernande Bras  Examination Guidelines: A complete evaluation includes B-mode imaging, spectral Doppler, color Doppler, and power Doppler as needed of all accessible portions of each vessel. Bilateral testing is considered an integral part of a complete examination. Limited examinations for reoccurring indications may be performed as noted.  Right Carotid Findings:  +----------+--------+--------+--------+------------------+------------------+           PSV cm/sEDV cm/sStenosisPlaque DescriptionComments           +----------+--------+--------+--------+------------------+------------------+ CCA Prox  161     28                                intimal thickening +----------+--------+--------+--------+------------------+------------------+ CCA Distal116     14                                intimal thickening +----------+--------+--------+--------+------------------+------------------+ ICA Prox  101     23      1-39%   heterogenous                         +----------+--------+--------+--------+------------------+------------------+ ICA Mid   97      22                                                   +----------+--------+--------+--------+------------------+------------------+ ICA Distal74      19                                                   +----------+--------+--------+--------+------------------+------------------+ ECA       118     12                                                   +----------+--------+--------+--------+------------------+------------------+ +----------+--------+-------+--------+-------------------+           PSV  cm/sEDV cmsDescribeArm Pressure (mmHG) +----------+--------+-------+--------+-------------------+ [id number]      18                                 +----------+--------+-------+--------+-------------------+ +---------+--------+---+--------+--+ VertebralPSV cm/s104EDV cm/s21 +---------+--------+---+--------+--+  Left Carotid Findings: +----------+--------+--------+--------+------------------+------------------+           PSV cm/sEDV cm/sStenosisPlaque DescriptionComments           +----------+--------+--------+--------+------------------+------------------+ CCA Prox  115     17                                intimal thickening  +----------+--------+--------+--------+------------------+------------------+ CCA Distal112     22                                intimal thickening +----------+--------+--------+--------+------------------+------------------+ ICA Prox  134     26      1-39%   heterogenous                         +----------+--------+--------+--------+------------------+------------------+ ICA Mid   125     24                                                   +----------+--------+--------+--------+------------------+------------------+ ICA Distal122     32                                                   +----------+--------+--------+--------+------------------+------------------+ ECA       138     12                                                   +----------+--------+--------+--------+------------------+------------------+ +----------+--------+--------+--------+-------------------+           PSV cm/sEDV cm/sDescribeArm Pressure (mmHG) +----------+--------+--------+--------+-------------------+ [id number]     19                                  +----------+--------+--------+--------+-------------------+ +---------+--------+--+--------+--+ VertebralPSV cm/s74EDV cm/s15 +---------+--------+--+--------+--+   Summary: Right Carotid: Velocities in the right ICA are consistent with a 1-39% stenosis. Left Carotid: Velocities in the left ICA are consistent with a 1-39% stenosis. Vertebrals:  Bilateral vertebral arteries demonstrate antegrade flow. Subclavians: Normal flow hemodynamics were seen in bilateral subclavian              arteries. *See table(s) above for measurements and observations.  Electronically signed by Lemar Livings MD on 02/10/2023 at 4:01:14 PM.    Final    DG CHEST PORT 1 VIEW Result Date: 02/10/2023 CLINICAL DATA:  Respiratory failure EXAM: PORTABLE CHEST 1 VIEW COMPARISON:  02/05/2023 FINDINGS: Right-sided PICC line terminates at the superior caval/atrial  junction. The low left hemithorax is minimally excluded. Median sternotomy for aortic valve repair. Left atrial appendage occlusion device. Midline trachea. Mild cardiomegaly. No right and no definite left pleural  effusion. No pneumothorax. Left base scarring or subsegmental atelectasis. No lobar consolidation. IMPRESSION: Cardiomegaly without congestive failure. Electronically Signed   By: Jeronimo Greaves M.D.   On: 02/10/2023 09:22   MR ANGIO HEAD WO CONTRAST Result Date: 02/10/2023 CLINICAL DATA:  Stroke/TIA, determine embolic source EXAM: MRA HEAD WITHOUT CONTRAST TECHNIQUE: Angiographic images of the Circle of Willis were acquired using MRA technique without intravenous contrast. COMPARISON:  None Available. FINDINGS: Anterior circulation: Bilateral intracranial ICAs, MCAs and ACAs are patent without proximal hemodynamically significant stenosis. Posterior circulation: Bilateral intradural vertebral arteries and basilar artery are patent without significant stenosis. Severe stenosis of the right P2 PCA. Left PCA is patent without significant stenosis IMPRESSION: Severe stenosis of the right P2 PCA. Electronically Signed   By: Feliberto Harts M.D.   On: 02/10/2023 00:03   [name] Result Date: 02/09/2023 CLINICAL DATA:  Neuro deficit, acute, stroke suspected. EXAM: MRI HEAD WITHOUT CONTRAST TECHNIQUE: Multiplanar, multiecho pulse sequences of the brain and surrounding structures were obtained without intravenous contrast. COMPARISON:  Head CT 02/09/2023 and MRI 11/09/2021 FINDINGS: Brain: There are small acute infarcts involving subcortical white matter in the posterior left frontal lobe and posterior left temporal lobe. A few small chronic cerebral and cerebellar microhemorrhages are unchanged from the prior MRI. Moderate-sized chronic right MCA and small chronic right cerebellar infarcts are again noted with ex vacuo dilatation of the right lateral ventricle. There is a background of mild  chronic small vessel ischemia in the cerebral white matter which is similar to the prior MRI. There is moderate cerebral atrophy with prominent extra-axial CSF over the right greater than left cerebral convexities being unchanged from the prior MRI and attributed to atrophy and subarachnoid space enlargement. No mass, midline shift, or definite extra-axial fluid collection is identified. Vascular: Major intracranial vascular flow voids are preserved. Skull and upper cervical spine: Unremarkable bone marrow signal. Sinuses/Orbits: Bilateral cataract extraction. Clear paranasal sinuses. Large chronic right mastoid effusion. Other: None. IMPRESSION: 1. Small acute left frontal and left temporal white matter infarcts. 2. Chronic ischemia including old right MCA and right cerebellar infarcts. Electronically Signed   By: Sebastian Ache M.D.   On: 02/09/2023 15:05   CT HEAD CODE STROKE WO CONTRAST Result Date: 02/09/2023 CLINICAL DATA:  Code stroke. Neuro deficit, acute, stroke suspected. EXAM: CT HEAD WITHOUT CONTRAST TECHNIQUE: Contiguous axial images were obtained from the base of the skull through the vertex without intravenous contrast. RADIATION DOSE REDUCTION: This exam was performed according to the departmental dose-optimization program which includes automated exposure control, adjustment of the mA and/or kV according to patient size and/or use of iterative reconstruction technique. COMPARISON:  Head CT 02/16/2022 and MRI 11/09/2021 FINDINGS: Brain: There is no evidence of an acute cortically based infarct, intracranial hemorrhage, mass, midline shift, or extra-axial fluid collection. A moderate-sized chronic right MCA infarct is unchanged with involvement of the operculum, insula, and basal ganglia and associated ex vacuo dilatation of the body and frontal horn of the right lateral ventricle. A new 7 mm discrete hypodense focus in the subcortical white matter of the posterior left frontal lobe is suggestive of  an age indeterminate lacunar infarct. Hypodensities elsewhere in the cerebral white matter bilaterally are unchanged and nonspecific but compatible with mild chronic small vessel ischemic disease. A small chronic right cerebellar infarct is unchanged. There is moderate cerebral atrophy. Enlarged extra-axial CSF spaces over the frontal convexities are unchanged and attributed to atrophy. Vascular: Calcified atherosclerosis at the skull base. No hyperdense  vessel. Skull: No acute fracture or suspicious osseous lesion. Sinuses/Orbits: Visualized paranasal sinuses are clear. Large chronic right mastoid effusion. Bilateral cataract extraction. Other: None. ASPECTS (Alberta Stroke Program Early CT Score) - Ganglionic level infarction (caudate, lentiform nuclei, internal capsule, insula, M1-M3 cortex): 7 - Supraganglionic infarction (M4-M6 cortex): 3 Total score (0-10 with 10 being normal): 10 These results were communicated to Dr. Iver Nestle at 1:30 pm on 02/09/2023 by text page via the [facility] messaging system. IMPRESSION: 1. No evidence of acute cortically based infarct or intracranial hemorrhage. ASPECTS of 10. 2. Age indeterminate left frontal white matter lacunar infarct. 3. Chronic ischemia including an old right MCA infarct. Electronically Signed   By: Sebastian Ache M.D.   On: 02/09/2023 13:31   Family Communication: Discussed with patient, she understand and agree. All questions answereed.  Disposition: Status is: Inpatient Remains inpatient appropriate because: follow h/h,   Planned Discharge Destination: Home with Home Health palliative care follow up, oncology follow up    MDM level 3- He is sick with multiple medical issues including acute stroke, respiratory failure, acute blood loss anemia, kidney injury, electrolyte abnormalities.  Patient will need close hemodynamic, neurologic, telemetry monitoring.  Patient is at high risk for sudden clinical deterioration.  Author: Marcelino Duster,  MD 02/11/2023 11:58 AM Secure chat 7am to 7pm For on call review [email or web address].

## 2023-02-11 NOTE — TOC Progression Note (Signed)
Transition of Care (TOC) - Progression Note   Patient Details  Name: [name]. MRN: [id number] Date of Birth: 01/04/1936  Transition of Care [facility]) CM/SW Contact  Ewing Schlein, LCSW Phone Number: 02/11/2023, 4:00 PM  Clinical Narrative: PT evaluation recommended SNF. CSW met with patient to discuss recommendations. Patient is agreeable to rehab. FL2 done; PASRR confirmed. Initial referral faxed out. TOC awaiting bed offers.  Expected Discharge Plan: Skilled Nursing Facility Barriers to Discharge: Continued Medical Work up, SNF Pending bed offer, English as a second language teacher  Expected Discharge Plan and Services In-house Referral: Clinical Social Work Living arrangements for the past 2 months: Single Family Home  Social Determinants of Health (SDOH) Interventions SDOH Screenings   Food Insecurity: No Food Insecurity (02/03/2023)  Housing: Low Risk  (02/03/2023)  Transportation Needs: No Transportation Needs (02/03/2023)  Utilities: Not At Risk (02/03/2023)  Alcohol Screen: Low Risk  (06/02/2021)  Depression (PHQ2-9): Low Risk  (06/16/2021)  Financial Resource Strain: Low Risk  (06/16/2021)  Social Connections: Unknown (02/03/2023)  Stress: No Stress Concern Present (06/16/2021)  Tobacco Use: Low Risk  (02/07/2023)   Readmission Risk Interventions    02/11/2023    4:00 PM 11/10/2021    3:54 PM  Readmission Risk Prevention Plan  Transportation Screening Complete Complete  PCP or Specialist Appt within 3-5 Days Not Complete Complete  Not Complete comments Patient will discharge to SNF.   HRI or Home Care Consult Complete Complete  Social Work Consult for Recovery Care Planning/Counseling Complete Complete  Palliative Care Screening Not Applicable Not Applicable  Medication Review Oceanographer) Complete Complete

## 2023-02-11 NOTE — Progress Notes (Signed)
Speech Language Pathology Treatment: Dysphagia  Patient Details Name: [name]. MRN: [id number] DOB: 20-Mar-1935 [name] Date: 02/11/2023 Time: 5366-4403 SLP Time Calculation (min) (ACUTE ONLY): 25 min  Assessment / Plan / Recommendation Clinical Impression  Patient seen by SLP for skilled treatment focused on PO trials after being made NPO following acute CVA. Patient was awake and alert, lips and and oral mucosa were very dry and speech was dysarthric. SLP completed oral care via toothette toothbrush and sponges. He then requested ice chips over sips of water. Following a few small ice chips, patient's speech quality improved. He told SLP that he typically can tolerate Ensure shakes. He drank approximately 50% of Ensure shake, exhibiting mildly delayed swallow initiation but no immediate coughing or throat clearing. Puree solids (applesauce) were tolerated without overt s/s aspiration. He did exhibit delayed throat clearing which sounded dry and a little congested but not productive following liquid and puree solid intake. Patient endorsed feeling better after having had some liquids. SLP is recommending to restart previously recommended PO diet following MBS on Saturday (2/1) of full liquids/thin consistency. SLP will follow for toleration and ability to advance.    HPI HPI: 88yo male admitted 02/02/23 with SOB 2/2 CHF exacerbation with possible aspiration event. PMH: acquired dilation of the ascending aorta and aortic root, CAD, AFib, Right basal ganglia CVA (2018), HTN, heart murmur, AVR, OA, BPH, SBO, neuropathy, thrombocytopenia.  PMH+ for dysphagia from CVA 2018 requiring thickener and another CVA that did not impair his swallowing. Pt developed some respiratory difficulties this am - and was placed on 4 L HFNC - s/p surgery with urology stent and then sent from surgery to ICU on vasopressors and Bipap 1/30. Subsequently taken off Bipap and awoke adequately for po. MBS on 02/09/23  recommended full liquids/thin consistency however after returning from [facility], patient with s/s stroke and had MRI brain which showed Small acute left frontal and left temporal white matter infarcts. He was made NPO.      SLP Plan  Continue with current plan of care      Recommendations for follow up therapy are one component of a multi-disciplinary discharge planning process, led by the attending physician.  Recommendations may be updated based on patient status, additional functional criteria and insurance authorization.    Recommendations  Diet recommendations: Thin liquid;Other(comment) (full liquids) Liquids provided via: Cup;No straw Medication Administration: Other (Comment) (as tolerated) Supervision: Patient able to self feed;Intermittent supervision to cue for compensatory strategies Compensations: Slow rate;Small sips/bites Postural Changes and/or Swallow Maneuvers: Seated upright 90 degrees;Upright 30-60 min after meal                  Oral care BID   Intermittent Supervision/Assistance Dysphagia, oropharyngeal phase (R13.12)     Continue with current plan of care     Angela Nevin, MA, CCC-SLP Speech Therapy

## 2023-02-11 NOTE — [place] FL2 (Signed)
[place] MEDICAID [id number] LEVEL OF CARE FORM     IDENTIFICATION  Patient Name: [name]. Birthdate: 10/02/1935 Sex: male Admission Date (Current Location): 02/02/2023  [place] and [place] Number:  Producer, television/film/video and Address:  [facility],  [address], [place] [address]      Provider Number: [id number]  Attending Physician Name and Address:  Marcelino Duster, MD  Relative Name and Phone Number:  Peretz Thieme (son) Ph: [phone]    Current Level of Care: Hospital Recommended Level of Care: Skilled Nursing Facility Prior Approval Number:    Date Approved/Denied:   PASRR Number: [id number]  Discharge Plan: SNF    Current Diagnoses: Patient Active Problem List   Diagnosis Date Noted   Ureteral calculus, right 02/06/2023   Nephrolithiasis 02/06/2023   Sepsis due to Enterobacter (HCC) 02/06/2023   Renal cyst 02/06/2023   Aspiration pneumonia (HCC) 02/04/2023   DNR (do not resuscitate) 02/04/2023   Acute respiratory failure with hypoxia (HCC) 02/02/2023   Elevated troponin 02/02/2023   Volume overload 02/02/2023   S/P total knee arthroplasty, right 08/09/2022   Gait abnormality 06/28/2022   Bilateral foot-drop 06/28/2022   Preoperative cardiovascular examination 05/30/2022   Other secondary pulmonary hypertension (HCC) 03/20/2022   COVID-19 virus infection 11/11/2021   Weakness 11/08/2021   Urinary catheter in place 11/08/2021   Hematuria 09/25/2021   Symptomatic anemia 09/23/2021   HLD (hyperlipidemia) 09/23/2021   S/P total knee arthroplasty, left 09/19/2021   Abnormal findings on diagnostic imaging of other specified body structures 06/02/2021   Hypoalbuminemia due to protein-calorie malnutrition (HCC) 05/29/2021   Valvular heart disease 05/28/2021   Cerebrovascular accident (CVA) (HCC) 05/21/2021   SIRS (systemic inflammatory response syndrome) (HCC) 05/21/2021   Generalized weakness 05/21/2021   Otitis externa  05/21/2021   CAD (coronary artery disease) 12/19/2020   Protein-calorie malnutrition, moderate (HCC) 08/02/2020   S/P left total hip arthroplasty 08/02/2020   Closed left hip fracture, initial encounter (HCC) 08/01/2020   Chronic congestive heart failure (HCC) 05/26/2020   Elevated PSA 05/26/2020   History of adenomatous polyp of colon 05/26/2020   History of anemia 05/26/2020   Hypercoagulable state (HCC) 05/26/2020   Impairment of balance 05/26/2020   Peripheral neuropathy 05/26/2020   Personal history of other malignant neoplasm of skin 05/26/2020   Prediabetes 05/26/2020   Presence of prosthetic heart valve 05/26/2020   Sleep disorder 05/26/2020   Thrombocytopenia (HCC) 05/26/2020   Vitamin D deficiency 05/26/2020   Acquired dilation of ascending aorta and aortic root (HCC)    Anemia 09/05/2018   Anemia due to chronic blood loss 09/05/2018   Long term current use of anticoagulant 02/10/2018   s/p mitral valve repair 01/30/2018   S/P tricuspid valve repair 01/30/2018   S/P Maze operation for atrial fibrillation 01/30/2018   Tricuspid regurgitation    Mitral valve regurgitation 10/14/2017   Permanent atrial fibrillation (HCC)    Hyperglycemia    History of ischemic right MCA stroke 10/29/2016   Hypokalemia    Left hemiparesis (HCC)    Dysphagia, post-stroke    S/P AVR (aortic valve replacement)    Benign prostatic hyperplasia with lower urinary tract symptoms    Essential hypertension    Gout    History of small bowel obstruction    History of BPH    S/P AVR 08/03/2010   Hypertrophy of prostate with urinary obstruction and other lower urinary tract symptoms (LUTS) 05/21/2001    Orientation RESPIRATION BLADDER Height & Weight  Self, Time, Situation, Place  Normal Continent, Indwelling catheter (Foley) Weight: 181 lb 3.5 oz (82.2 kg) Height:  6' (182.9 cm)  BEHAVIORAL SYMPTOMS/MOOD NEUROLOGICAL BOWEL NUTRITION STATUS      Continent Diet (See discharge summary)   AMBULATORY STATUS COMMUNICATION OF NEEDS Skin   Extensive Assist Verbally Other (Comment), Skin abrasions (Abrasions: bilateral arms; Erythema: bilateral arms; Ecchymosis: bilateral arms and hands; Hematoma: left arm)                       Personal Care Assistance Level of Assistance  Bathing, Feeding, Dressing Bathing Assistance: Limited assistance Feeding assistance: Independent Dressing Assistance: Limited assistance     Functional Limitations Info  Sight, Hearing, Speech Sight Info: Impaired Hearing Info: Impaired Speech Info: Adequate    SPECIAL CARE FACTORS FREQUENCY  PT (By licensed PT), OT (By licensed OT)     PT Frequency: 5x's/week OT Frequency: 5x's/week            Contractures Contractures Info: Not present    Additional Factors Info  Code Status, Allergies, Insulin Sliding Scale Code Status Info: DNR Allergies Info: Sulfa Antibiotics, Sulfamethoxazole-trimethoprim, Zestril (Lisinopril)   Insulin Sliding Scale Info: See discharge summary       Current Medications (02/11/2023):  This is the current hospital active medication list Current Facility-Administered Medications  Medication Dose Route Frequency Provider Last Rate Last Admin   0.9 %  sodium chloride infusion (Manually program via Guardrails IV Fluids)   Intravenous Once Ogan, Thomasene Lot, MD       0.9 %  sodium chloride infusion  10 mL/hr Intravenous Once Lannie Fields, DO   Held at 02/07/23 1036   acetaminophen (TYLENOL) tablet 650 mg  650 mg Oral Q6H PRN Regalado, Belkys A, MD   650 mg at 02/08/23 0417   Or   acetaminophen (TYLENOL) suppository 650 mg  650 mg Rectal Q6H PRN Regalado, Belkys A, MD   650 mg at 02/05/23 2225   albuterol (PROVENTIL) (2.5 MG/3ML) 0.083% nebulizer solution 2.5 mg  2.5 mg Nebulization Q4H PRN Martina Sinner, MD   2.5 mg at 02/07/23 0523   allopurinol (ZYLOPRIM) tablet 50 mg  50 mg Oral q morning Desai, Rahul P, PA-C   50 mg at 02/08/23 1152    arformoterol (BROVANA) nebulizer solution 15 mcg  15 mcg Nebulization BID Regalado, Belkys A, MD   15 mcg at 02/11/23 0759   budesonide (PULMICORT) nebulizer solution 0.5 mg  0.5 mg Nebulization BID Selmer Dominion B, NP   0.5 mg at 02/11/23 1610   Chlorhexidine Gluconate Cloth 2 % PADS 6 each  6 each Topical Daily Regalado, Belkys A, MD   6 each at 02/11/23 0500   docusate (COLACE) 50 MG/5ML liquid 100 mg  100 mg Oral Daily Desai, Rahul P, PA-C   100 mg at 02/06/23 1146   feeding supplement (ENSURE ENLIVE / ENSURE PLUS) liquid 237 mL  1 Bottle Oral TID BM Desai, Rahul P, PA-C   237 mL at 02/11/23 1415   insulin aspart (novoLOG) injection 0-9 Units  0-9 Units Subcutaneous Q4H Selmer Dominion B, NP   2 Units at 02/11/23 1253   ipratropium-albuterol (DUONEB) 0.5-2.5 (3) MG/3ML nebulizer solution 3 mL  3 mL Nebulization Q6H Desai, Rahul P, PA-C   3 mL at 02/11/23 1423   meropenem (MERREM) 1 g in sodium chloride 0.9 % 100 mL IVPB  1 g Intravenous Q12H Kalman Shan, MD   Stopped at 02/11/23 1122  multivitamin with minerals tablet 1 tablet  1 tablet Oral Daily Kalman Shan, MD       ondansetron (ZOFRAN) tablet 4 mg  4 mg Oral Q6H PRN Regalado, Belkys A, MD       Or   ondansetron (ZOFRAN) injection 4 mg  4 mg Intravenous Q6H PRN Regalado, Belkys A, MD   4 mg at 02/03/23 1105   Oral care mouth rinse  15 mL Mouth Rinse 4 times per day Regalado, Belkys A, MD   15 mL at 02/11/23 1149   Oral care mouth rinse  15 mL Mouth Rinse PRN Regalado, Belkys A, MD       pantoprazole (PROTONIX) injection 40 mg  40 mg Intravenous Q12H Regalado, Belkys A, MD   40 mg at 02/11/23 1058   polyvinyl alcohol (LIQUIFILM TEARS) 1.4 % ophthalmic solution 1 drop  1 drop Both Eyes QID PRN Martina Sinner, MD       sodium chloride flush (NS) 0.9 % injection 10-40 mL  10-40 mL Intracatheter Q12H Martina Sinner, MD   10 mL at 02/11/23 1058   sodium chloride flush (NS) 0.9 % injection 10-40 mL  10-40 mL Intracatheter PRN  Martina Sinner, MD       sodium chloride flush (NS) 0.9 % injection 3-10 mL  3-10 mL Intravenous Q12H Martina Sinner, MD   10 mL at 02/11/23 1058   sodium chloride flush (NS) 0.9 % injection 3-10 mL  3-10 mL Intravenous PRN Martina Sinner, MD       sodium phosphate (FLEET) enema 1 enema  1 enema Rectal Daily PRN Celine Mans, Rahul P, PA-C   1 enema at 02/06/23 1146     Discharge Medications: Please see discharge summary for a list of discharge medications.  Relevant Imaging Results:  Relevant Lab Results:   Additional Information SSN: [id number]. Patient may discharge on IV antibiotics.  Ewing Schlein, LCSW

## 2023-02-12 DIAGNOSIS — I10 Essential (primary) hypertension: Secondary | ICD-10-CM | POA: Diagnosis not present

## 2023-02-12 DIAGNOSIS — J9601 Acute respiratory failure with hypoxia: Secondary | ICD-10-CM | POA: Diagnosis not present

## 2023-02-12 DIAGNOSIS — I4821 Permanent atrial fibrillation: Secondary | ICD-10-CM | POA: Diagnosis not present

## 2023-02-12 DIAGNOSIS — D62 Acute posthemorrhagic anemia: Secondary | ICD-10-CM

## 2023-02-12 DIAGNOSIS — Z66 Do not resuscitate: Secondary | ICD-10-CM | POA: Diagnosis not present

## 2023-02-12 DIAGNOSIS — I2729 Other secondary pulmonary hypertension: Secondary | ICD-10-CM

## 2023-02-12 LAB — TYPE AND SCREEN
ABO/RH(D): O POS
Antibody Screen: NEGATIVE
Unit division: 0
Unit division: 0

## 2023-02-12 LAB — PROTIME-INR
INR: 1.5 — ABNORMAL HIGH (ref 0.8–1.2)
Prothrombin Time: 17.9 s — ABNORMAL HIGH (ref 11.4–15.2)

## 2023-02-12 LAB — BPAM RBC
Blood Product Expiration Date: 202503022359
Blood Product Expiration Date: 202503022359
ISSUE DATE / TIME: 202502031422
ISSUE DATE / TIME: 202502032052
Unit Type and Rh: 5100
Unit Type and Rh: 5100

## 2023-02-12 LAB — GLUCOSE, CAPILLARY
Glucose-Capillary: 134 mg/dL — ABNORMAL HIGH (ref 70–99)
Glucose-Capillary: 137 mg/dL — ABNORMAL HIGH (ref 70–99)
Glucose-Capillary: 115 mg/dL — ABNORMAL HIGH (ref 70–99)
Glucose-Capillary: 154 mg/dL — ABNORMAL HIGH (ref 70–99)

## 2023-02-12 LAB — HEMOGLOBIN AND HEMATOCRIT, BLOOD
HCT: 22.6 % — ABNORMAL LOW (ref 39.0–52.0)
Hemoglobin: 7.3 g/dL — ABNORMAL LOW (ref 13.0–17.0)

## 2023-02-12 MED ORDER — IPRATROPIUM-ALBUTEROL 0.5-2.5 (3) MG/3ML IN SOLN
3.0000 mL | Freq: Two times a day (BID) | RESPIRATORY_TRACT | Status: DC
  Administered 2023-02-12 – 2023-02-14 (×5): 3 mL via RESPIRATORY_TRACT
  Filled 2023-02-12 (×5): qty 3

## 2023-02-12 NOTE — Progress Notes (Signed)
 Speech Language Pathology Treatment: Dysphagia  Patient Details Name: [name]. MRN: [id number] DOB: 11/18/1935 [name] Date: 02/12/2023 Time: 8788-8749 SLP Time Calculation (min) (ACUTE ONLY): 39 min  Assessment / Plan / Recommendation Clinical Impression  Pt seen for dysphagia management - He continues to endorse xerostomia and admits to increased difficulty getting purees pushed from mouth into throat.  SLP obtained a WOW and educated pt and his family to results - reviewing flouroscopy loops.  Advised pt to plan to start RMT for swallowing rehab given his report of worsening function with this illness.  Discussed with pt consideration for dietary advancement as full liquid diet is difficult to obtain adequate nutrition.  Pt agreeable to expand diet to dys2/minced/thin.  Pt noted to frequently swish and expectorate with water  during session - due to his xerostomia.  Advised he start ALL intake with liquids due to decreased lingual strength and xerostomia.  Pt making good progress re: swallowing - and denies gagging with intake since this SLP saw him on Friday 02/08/2023.      HPI HPI: 88yo male admitted 02/02/23 with SOB 2/2 CHF exacerbation with possible aspiration event. PMH: acquired dilation of the ascending aorta and aortic root, CAD, AFib, Right basal ganglia CVA (2018), HTN, heart murmur, AVR, OA, BPH, SBO, neuropathy, thrombocytopenia.  PMH+ for dysphagia from CVA 2018 requiring thickener and another CVA that did not impair his swallowing. Pt developed some respiratory difficulties this am - and was placed on 4 L HFNC - s/p surgery with urology stent and then sent from surgery to ICU on vasopressors and Bipap 1/30. Subsequently taken off Bipap and awoke adequately for po. MBS on 02/09/23 recommended full liquids/thin consistency however after returning from MBS, patient with s/s stroke and had MRI brain which showed Small acute left frontal and left temporal white matter infarcts.  Pt is  s/p repeat evaluation after cva.  Follow up re: swallowing indicated.      SLP Plan  Continue with current plan of care  Patient needs continued Speech Lanaguage Pathology Services   Recommendations for follow up therapy are one component of a multi-disciplinary discharge planning process, led by the attending physician.  Recommendations may be updated based on patient status, additional functional criteria and insurance authorization.    Recommendations  Diet recommendations: Dysphagia 2 (fine chop);Thin liquid Liquids provided via: Cup;No straw Medication Administration: Other (Comment) (whole, with Ensure preferred by pt)                  Oral care BID   Frequent or constant Supervision/Assistance Dysphagia, oropharyngeal phase (R13.12)     Continue with current plan of care    Gerald POUR, MS [facility] SLP Acute Rehab Services Office [phone]  [name]  02/12/2023, 2:23 PM

## 2023-02-12 NOTE — Progress Notes (Signed)
 Heart Failure Navigator Progress Note  Assessed for Heart & Vascular TOC clinic readiness.  Patient does not meet criteria due to EF 60-65%, has a scheduled [facility] appointment on 03/27/23, plan for patient to discharge to a SNF. .   Navigator will sign off at this time.   Stephane Haddock, BSN, Scientist, Clinical (histocompatibility And Immunogenetics) Only

## 2023-02-12 NOTE — Evaluation (Signed)
 Speech Language Pathology Evaluation Patient Details Name: [name]. MRN: [id number] DOB: 04/14/1935 [name] Date: 02/12/2023 Time: 8864-8794 SLP Time Calculation (min) (ACUTE ONLY): 30 min  Problem List:  Patient Active Problem List   Diagnosis Date Noted   Ureteral calculus, right 02/06/2023   Nephrolithiasis 02/06/2023   Sepsis due to Enterobacter (HCC) 02/06/2023   Renal cyst 02/06/2023   Aspiration pneumonia (HCC) 02/04/2023   DNR (do not resuscitate) 02/04/2023   Acute respiratory failure with hypoxia (HCC) 02/02/2023   Elevated troponin 02/02/2023   Volume overload 02/02/2023   S/P total knee arthroplasty, right 08/09/2022   Gait abnormality 06/28/2022   Bilateral foot-drop 06/28/2022   Preoperative cardiovascular examination 05/30/2022   Other secondary pulmonary hypertension (HCC) 03/20/2022   COVID-19 virus infection 11/11/2021   Weakness 11/08/2021   Urinary catheter in place 11/08/2021   Hematuria 09/25/2021   Symptomatic anemia 09/23/2021   HLD (hyperlipidemia) 09/23/2021   S/P total knee arthroplasty, left 09/19/2021   Abnormal findings on diagnostic imaging of other specified body structures 06/02/2021   Hypoalbuminemia due to protein-calorie malnutrition (HCC) 05/29/2021   Valvular heart disease 05/28/2021   Cerebrovascular accident (CVA) (HCC) 05/21/2021   SIRS (systemic inflammatory response syndrome) (HCC) 05/21/2021   Generalized weakness 05/21/2021   Otitis externa 05/21/2021   CAD (coronary artery disease) 12/19/2020   Protein-calorie malnutrition, moderate (HCC) 08/02/2020   S/P left total hip arthroplasty 08/02/2020   Closed left hip fracture, initial encounter (HCC) 08/01/2020   Chronic congestive heart failure (HCC) 05/26/2020   Elevated PSA 05/26/2020   History of adenomatous polyp of colon 05/26/2020   History of anemia 05/26/2020   Hypercoagulable state (HCC) 05/26/2020   Impairment of balance 05/26/2020   Peripheral neuropathy  05/26/2020   Personal history of other malignant neoplasm of skin 05/26/2020   Prediabetes 05/26/2020   Presence of prosthetic heart valve 05/26/2020   Sleep disorder 05/26/2020   Thrombocytopenia (HCC) 05/26/2020   Vitamin D  deficiency 05/26/2020   Acquired dilation of ascending aorta and aortic root (HCC)    Anemia 09/05/2018   Anemia due to chronic blood loss 09/05/2018   Long term current use of anticoagulant 02/10/2018   s/p mitral valve repair 01/30/2018   S/P tricuspid valve repair 01/30/2018   S/P Maze operation for atrial fibrillation 01/30/2018   Tricuspid regurgitation    Mitral valve regurgitation 10/14/2017   Permanent atrial fibrillation (HCC)    Hyperglycemia    History of ischemic right MCA stroke 10/29/2016   Hypokalemia    Left hemiparesis (HCC)    Dysphagia, post-stroke    S/P AVR (aortic valve replacement)    Benign prostatic hyperplasia with lower urinary tract symptoms    Essential hypertension    Gout    History of small bowel obstruction    History of BPH    S/P AVR 08/03/2010   Hypertrophy of prostate with urinary obstruction and other lower urinary tract symptoms (LUTS) 05/21/2001   Past Medical History:  Past Medical History:  Diagnosis Date   Acquired dilation of ascending aorta and aortic root (HCC)    43mm aortic root and 41mm ascending aorta on echo 03/2020   Arthritis    Bladder stones    Borderline diabetes    BPH (benign prostatic hyperplasia)    CAD (coronary artery disease) 12/19/2020   Non-obstructive coronary artery disease at cath in 2019   Complication of anesthesia    problem with voiding after anesthesia,   Coronary artery disease    cardiologist-  dr skains/  katheryn gerhart NP--- per cath 06-02-2010 non-obstructive cad pLAD 30-40%   CVA (cerebral vascular accident) (HCC) 10/24/2016   Diverticulosis of colon    Dysrhythmia    afib   Gout    Heart murmur    History of adenomatous polyp of colon    2002-- tubular adenoma    History of aortic insufficiency    severe -- s/p  AVR 08-03-2010   History of small bowel obstruction    02/ 2007 mechanical sbo s/p  surgical intervention;  partial sbo 09/ 2011 and 03-20-2011 resolved without surgical intervention   History of urinary retention    HTN (hypertension)    Other secondary pulmonary hypertension (HCC) 03/20/2022   TTE 03/19/22: EF 65-70, no RWMA, moderate LVH, normal RVSF, severe pulmonary hypertension (RVSP 60.4), severe BAE, normal structure and function of mitral valve repair (mean gradient 9), mild TR, trivial AI, normal structure and function of AVR (mean 15.4), moderate PI, aortic root and ascending aorta 40 mm, RAP 15   Peripheral neuropathy    Persistent atrial fibrillation (HCC)    Pre-diabetes    S/P aortic valve replacement with prosthetic valve 08/03/2010   tissue valve   S/P Maze operation for atrial fibrillation 01/30/2018   Complete bilateral atrial lesion set using bipolar radiofrequency and cryothermy with clipping of LA appendage   S/P MVR (mitral valve repair) 01/30/2018   Complex valvuloplasty including artificial Gore-tex neochord placement x4 and Carbo medics Annuloflex ring annuloplasty, size 28   S/P patent foramen ovale closure 08/03/2010   at same time AVR   S/P tricuspid valve repair 01/30/2018   Using an MC3 Annuloplasty ring, size 28   Stroke (HCC)    Thrombocytopenia (HCC)    Tricuspid regurgitation    Past Surgical History:  Past Surgical History:  Procedure Laterality Date   BIOPSY  09/07/2018   Procedure: BIOPSY;  Surgeon: Elicia Claw, MD;  Location: [facility];  Service: Gastroenterology;;   CARDIAC CATHETERIZATION  06-02-2010  dr jeffrie   non-obstructive cad- pLAD 30-40%/  normal LVSF/  severe AI   CARDIOVASCULAR STRESS TEST  04/12/2016   Low risk nuclear perfusion study w/ no significant reversible ischemia/  normal LV function and wall motion ,  stress ef 60%/  2mm inferior and lateral scooped ST-segment  depression w/ exercise (may be repolarization abnormality), exercise capacity was moderately reduced   CATARACT EXTRACTION W/ INTRAOCULAR LENS  IMPLANT, BILATERAL  02/2010   CLIPPING OF ATRIAL APPENDAGE N/A 01/30/2018   Procedure: CLIPPING OF LEFT ATRIAL APPENDAGE USING ATRICLIP PRO2 ;  Surgeon: Dusty Sudie DEL, MD;  Location: [facility];  Service: Open Heart Surgery;  Laterality: N/A;   COLONOSCOPY WITH PROPOFOL  N/A 10/21/2018   Procedure: COLONOSCOPY WITH PROPOFOL ;  Surgeon: Elicia Claw, MD;  Location: [facility];  Service: Gastroenterology;  Laterality: N/A;   CYSTOSCOPY W/ URETERAL STENT PLACEMENT Right 02/07/2023   Procedure: CYSTOSCOPY WITH RETROGRADE PYELOGRAM/URETERAL STENT PLACEMENT;  Surgeon: Cam Morene ORN, MD;  Location: [facility];  Service: Urology;  Laterality: Right;   CYSTOSCOPY WITH LITHOLAPAXY N/A 06/05/2016   Procedure: CYSTOSCOPY WITH LITHOLAPAXY and fulgarization of bladder neck;  Surgeon: Watt Rush, MD;  Location: [facility];  Service: Urology;  Laterality: N/A;   ESOPHAGOGASTRODUODENOSCOPY (EGD) WITH PROPOFOL  N/A 09/07/2018   Procedure: ESOPHAGOGASTRODUODENOSCOPY (EGD) WITH PROPOFOL ;  Surgeon: Elicia Claw, MD;  Location: [facility];  Service: Gastroenterology;  Laterality: N/A;   EXPLORATORY LAPARTOMY /  CHOLECYSTECTOMY  02/28/2005   for Small  bowel  obstruction (mechnical)   IR ANGIO EXTRACRAN SEL COM CAROTID INNOMINATE UNI L MOD SED  10/24/2016   IR ANGIO VERTEBRAL SEL SUBCLAVIAN INNOMINATE BILAT MOD SED  10/24/2016   IR PERCUTANEOUS ART THROMBECTOMY/INFUSION INTRACRANIAL INC DIAG ANGIO  10/24/2016   IR RADIOLOGIST EVAL & MGMT  12/05/2016   LEFT KNEE ARTHROSCOPY  2006   MAZE N/A 01/30/2018   Procedure: MAZE;  Surgeon: Dusty Sudie DEL, MD;  Location[facility] OR;  Service: Open Heart Surgery;  Laterality: N/A;   MITRAL VALVE REPAIR N/A 01/30/2018   Procedure: MITRAL VALVE REPAIR (MVR) USING CARBOMEDICS ANNULOFLEX SIZE 28;  Surgeon: Dusty Sudie DEL, MD;  Location: [facility] OR;  Service: Open Heart Surgery;  Laterality: N/A;   POLYPECTOMY  10/21/2018   Procedure: POLYPECTOMY;  Surgeon: Elicia Claw, MD;  Location: [facility];  Service: Gastroenterology;;   RADIOLOGY WITH ANESTHESIA N/A 10/24/2016   Procedure: RADIOLOGY WITH ANESTHESIA;  Surgeon: Dolphus Carrion, MD;  Location: [facility] OR;  Service: Radiology;  Laterality: N/A;   RIGHT FOOT SURGERY     RIGHT MINIATURE ANTERIOR THORACOTOMY FOR AORTIC VALVE REPLACEMENT AND CLOSURE PATENT FORAMEN OVALE  08-03-2010  DR DUSTY Bohr Magna-ease pericardial tissue valve (25mm)   RIGHT/LEFT HEART CATH AND CORONARY ANGIOGRAPHY N/A 10/14/2017   Procedure: RIGHT/LEFT HEART CATH AND CORONARY ANGIOGRAPHY;  Surgeon: Wonda Sharper, MD;  Location: [facility]VASIVE CV LAB;  Service: Cardiovascular;  Laterality: N/A;   TEE WITHOUT CARDIOVERSION N/A 10/14/2017   Procedure: TRANSESOPHAGEAL ECHOCARDIOGRAM (TEE);  Surgeon: Delford Maude BROCKS, MD;  Location: [facility] ENDOSCOPY;  Service: Cardiovascular;  Laterality: N/A;   TOTAL HIP ARTHROPLASTY Left 08/02/2020   Procedure: TOTAL HIP ARTHROPLASTY ANTERIOR APPROACH;  Surgeon: Ernie Cough, MD;  Location: [facility];  Service: Orthopedics;  Laterality: Left;   TOTAL KNEE ARTHROPLASTY Left 09/19/2021   Procedure: TOTAL KNEE ARTHROPLASTY;  Surgeon: Ernie Cough, MD;  Location: [facility];  Service: Orthopedics;  Laterality: Left;   TOTAL KNEE ARTHROPLASTY Right 08/09/2022   Procedure: TOTAL KNEE ARTHROPLASTY;  Surgeon: Ernie Cough, MD;  Location: [facility];  Service: Orthopedics;  Laterality: Right;  90   TRANSTHORACIC ECHOCARDIOGRAM  05/30/2016  dr skains   moderate  LVH ef 60-65%/  bioprothesis aortic valve present ,normal grandient and no AI /  mild MV calcification , moderate MR /  mild PR/ moderate TR/  PASP 41mmHg/ (RA denisty was identified 04-27-2016 echo) and is seen again today, this is likely a promient eustacian ridge, atrium is normal size   TRICUSPID VALVE REPLACEMENT N/A  01/30/2018   Procedure: TRICUSPID VALVE REPAIR USING MC3 SIZE 28;  Surgeon: Dusty Sudie DEL, MD;  Location: [facility] OR;  Service: Open Heart Surgery;  Laterality: N/A;   HPI:  88yo male admitted 02/02/23 with SOB 2/2 CHF exacerbation with possible aspiration event. PMH: acquired dilation of the ascending aorta and aortic root, CAD, AFib, Right basal ganglia CVA (2018), HTN, heart murmur, AVR, OA, BPH, SBO, neuropathy, thrombocytopenia.  PMH+ for dysphagia from CVA 2018 requiring thickener and another CVA that did not impair his swallowing. Pt developed some respiratory difficulties this am - and was placed on 4 L HFNC - s/p surgery with urology stent and then sent from surgery to ICU on vasopressors and Bipap 1/30. Subsequently taken off Bipap and awoke adequately for po. MBS on 02/09/23 recommended full liquids/thin consistency however after returning from MBS, patient with s/s stroke and had MRI brain which showed Small acute left frontal and left temporal white matter infarcts. He was made NPO.  Assessment / Plan / Recommendation Clinical Impression  SLUMS ([place] Mental Status Exam) administered with pt scoring 24/30 which is indicative of mild cognitive deficits per testing authors.  Pt also demonstrates minimal dysarthria -especially with consonant clusters- that is likely exacerbated by his xerostomia.   Pt demonstrates strengths in areas of memory *recalling 5/5 word without cues; organization *naming 20 animals in 60 seconds and orientation *fully oriented*.  He did have difficulty with basic math question and correct hand placement for time on the clock drawing.  Notable for decreased lingual tip elevation also - uncertain if new. Also appears with decreased phonatory strength - ? if due to acute illness.  Pt's expressive and receptive language skills are intact.  Wife, Graylin, and caregiver, Claretta, arrived during session and advised that pt's speech is better today than it was last week.    Pt was  managing bills, medications, and appointments for both him and his wife prior to admission. He does admit to problem solving and memory difficulties new with this event.  Given his CVA and premorbid intact cognition, recommend follow up to mitigate his cognitive linguistic deficits and dysarthria/dysphagia to decrease caregiver burden.    SLP Assessment  SLP Recommendation/Assessment: Patient needs continued Speech Lanaguage Pathology Services SLP Visit Diagnosis: Cognitive communication deficit (R41.841)    Recommendations for follow up therapy are one component of a multi-disciplinary discharge planning process, led by the attending physician.  Recommendations may be updated based on patient status, additional functional criteria and insurance authorization.    Follow Up Recommendations  Skilled nursing-short term rehab (<3 hours/day)    Assistance Recommended at Discharge  Frequent or constant Supervision/Assistance  Functional Status Assessment Patient has had a recent decline in their functional status and demonstrates the ability to make significant improvements in function in a reasonable and predictable amount of time.  Frequency and Duration min 1 x/week  1 week      SLP Evaluation Cognition  Overall Cognitive Status: Impaired/Different from baseline Arousal/Alertness: Awake/alert Orientation Level: Oriented X4 Year: 2025 Month: February Day of Week: Correct Attention: Selective Selective Attention: Impaired Memory: Appears intact (recalled 5/5 words independently) Executive Function: Self Correcting Self Correcting: Impaired Self Correcting Impairment:  (clock drawing and math) Safety/Judgment: Appears intact (aware of need to call for assist and to go to rehb)       Comprehension  Auditory Comprehension Overall Auditory Comprehension: Appears within functional limits for tasks assessed Yes/No Questions: Not tested Commands: Within Functional Limits Conversation:  Complex Interfering Components: Attention;Hearing Visual Recognition/Discrimination Discrimination: Within Function Limits Reading Comprehension Reading Status: Not tested    Expression Expression Primary Mode of Expression: Verbal Verbal Expression Overall Verbal Expression: Appears within functional limits for tasks assessed Initiation: No impairment Repetition: No impairment Naming: No impairment Pragmatics: No impairment Written Expression Dominant Hand: Right Written Expression: Not tested   Oral / Motor  Oral Motor/Sensory Function Overall Oral Motor/Sensory Function:  (slight decreased in tip of tongue elevation) Motor Speech Overall Motor Speech: Impaired Respiration: Within functional limits Phonation: Low vocal intensity (slightly low phonatory strength) Resonance: Within functional limits Articulation: Impaired Level of Impairment:  (complex consonants) Intelligibility: Intelligible Motor Speech Errors: Not applicable Effective Techniques: Slow rate            [name] 02/12/2023, 2:16 PM [name], MS [facility] SLP Acute Rehab Services Office [phone]

## 2023-02-12 NOTE — Progress Notes (Signed)
 Physical Therapy Treatment Patient Details Name: [name]. MRN: [id number] DOB: 1935/12/30 [name] Date: 02/12/2023   History of Present Illness Patient is a 88 year old male who presented on 1/25 with dyspnea and hypoxia. Patient was admited with concerns of SBO. 1/27 patient pulled out NG tube with increased work of breath and fever. 1/29 patient on HFNC with CT revealing mild right hydronephrosis with 12 mm right proximal ureteral stone.1/31 patient was taken off vasopressors and received PRBCs. 2/1 patient found to have left side weakness with code stroke called. MRI revealed L MCA. PMH: R TKR, L THR, L TKA,h/o stroke, peripheral neuropathy, gout, HTN.    PT Comments   Pt admitted with above diagnosis.  Pt currently with functional limitations due to the deficits listed below (see PT Problem List). Pt in bed, pt alert and eager to participate with therapy today. Pt required mod A for supine to sit, mod A for sit to stand  and to safely complete SPT bed to recliner. Pt left seated in recliner, all needs in place. Pt will benefit from acute skilled PT to increase their independence and safety with mobility to allow discharge.      If plan is discharge home, recommend the following: Two people to help with walking and/or transfers;A lot of help with bathing/dressing/bathroom;Assistance with cooking/housework;Assist for transportation;Help with stairs or ramp for entrance   Can travel by private vehicle     No  Equipment Recommendations  None recommended by PT    Recommendations for Other Services       Precautions / Restrictions Precautions Precautions: Fall Restrictions Weight Bearing Restrictions Per Provider Order: No     Mobility  Bed Mobility Overal bed mobility: Needs Assistance Bed Mobility: Supine to Sit     Supine to sit: Mod assist, HOB elevated, Used rails     General bed mobility comments: with increased time and cues for proper hand and foot movements.     Transfers Overall transfer level: Needs assistance Equipment used: Rolling walker (2 wheels) Transfers: Bed to chair/wheelchair/BSC   Stand pivot transfers: Mod assist         General transfer comment: min cues, slight posterior lean and facilitation for anterior weight shift and extension posture, A for RW management    Ambulation/Gait               General Gait Details: NT   Stairs             Wheelchair Mobility     Tilt Bed    Modified Rankin (Stroke Patients Only)       Balance Overall balance assessment: Needs assistance Sitting-balance support: Bilateral upper extremity supported, Feet supported Sitting balance-Leahy Scale: Fair         Standing balance comment: unable to attempt                            Cognition Arousal: Alert Behavior During Therapy: WFL for tasks assessed/performed Overall Cognitive Status: Impaired/Different from baseline                                 General Comments: patient slow to respond to questions however able to request to use telephone to call spouse at end of tx session and able to recall phone number        Exercises      General Comments  Pertinent Vitals/Pain Pain Assessment Pain Assessment: No/denies pain    Home Living                          Prior Function            PT Goals (current goals can now be found in the care plan section) Acute Rehab PT Goals Patient Stated Goal: Regain IND PT Goal Formulation: Patient unable to participate in goal setting Time For Goal Achievement: 02/24/23 Potential to Achieve Goals: Fair Progress towards PT goals: Progressing toward goals    Frequency    Min 1X/week      PT Plan      Co-evaluation              AM-PAC PT 6 Clicks Mobility   Outcome Measure  Help needed turning from your back to your side while in a flat bed without using bedrails?: A Little Help needed moving from  lying on your back to sitting on the side of a flat bed without using bedrails?: A Lot Help needed moving to and from a bed to a chair (including a wheelchair)?: A Lot Help needed standing up from a chair using your arms (e.g., wheelchair or bedside chair)?: A Lot Help needed to walk in hospital room?: Total Help needed climbing 3-5 steps with a railing? : Total 6 Click Score: 11    End of Session Equipment Utilized During Treatment: Gait belt Activity Tolerance: Patient limited by fatigue Patient left: with call bell/phone within reach;in chair Nurse Communication: Mobility status PT Visit Diagnosis: Muscle weakness (generalized) (M62.81);History of falling (Z91.81);Difficulty in walking, not elsewhere classified (R26.2)     Time: 8980-8963 PT Time Calculation (min) (ACUTE ONLY): 17 min  Charges:    $Therapeutic Activity: 8-22 mins PT General Charges $$ ACUTE PT VISIT: 1 Visit                     [place], PT Acute Rehab     [place] VEAR Drone 02/12/2023, 7:24 PM

## 2023-02-12 NOTE — Progress Notes (Signed)
 Progress Note   Patient: [name]. FMW:[id number] DOB: 04-Jun-1935 DOA: 02/02/2023     9 DOS: the patient was seen and examined on 02/12/2023   Brief hospital course: [name]. is a 88 y.o. male who has an extensive PMH as below, significant for CAD, CVA, aortic valve replacement 2012, hypertension, pulmonary hypertension, atrial fibrillation and small bowel obstruction  . He presented to [facility] ED 1/25 with dyspnea that woke him from his sleep the night prior. He had also complained of LE edema over the preceding few weeks. On EMS arrival, he had sats of 85% on room air. He was placed on [place] O2 and given Solumedrol and DuoNebs in route to ED.   In ED, CXR was suggestive of mild edema. He was admitted by TRH and started on diuresis. He was seen by cardiology in consultation who agreed with diuresis for fluid overload and obtaining an echo. Echo was obtained and demonstrated EF 60-65%, mod LVH, RV pressure overload with RVSP 61, LA and RA severely dilated.   On evening of 1/26, he developed nausea and vomiting with probable aspiration event. There was concern for SBO and was seen by general surgery who recommended continuing NPO and continuing NGT to suction.   On 1/27, diuresis was held due to bump in renal function after IV lasix . He was only net negative since admit. He did not appear to have excess volume per cardiology notes and appeared improved from CHF standpoint. Dyspnea and hypoxia were felt to more likely be related to aspiration PNA.  General surgery had also seen and he had benign exam and was passing flatus. He unfortunately pulled out his NGT that day.   After pulling out NGT 1/27, he had episode of tachypnea and increased WOB along with spike in temp to 102.22F. Due to increased WOB, PCCM asked to see in consultation. He had been on 4L [place] prior to this episode.   Blood cultures from admission also returned positive for Enterobacter cloacae. He was on Cefepime   escalated to Meropenem .  Family report concerns of pt aspirating for over a week prior to admit. Further GOC clarified with pt's son, [name]/ HPOA, changed to DNR/ DNI.    Off vasopressors since 1/30. Improving renal function. Hgb down to 6.2, plts 23 overnight, transfused PRBC and plts. Currently no complaints, has multiple concerns about his nutrition/ diet.  Developed left upper extremity hematoma 20-25 cm.  Received blood products.  Assessment and Plan: Acute ischemic stroke: Thought to be related to FFP/platelet infusion MRI brain on 2/1 showed small acute left frontal and left temporal white matter infarct. MR angio showed severe stenosis of right P2 PCA Neurology follow up appreciated. Repeat CT head ordered plan to restart anticoagulation if no hemorrhage and h/h stable. Seen by SLP- dysphagia 2 diet.   Acute hypoxic respiratory failure-multifactorial in the setting of aspiration pneumonia, fluid overload/CHF exacerbation: 02/09/2023: Off of oxygen. Continue aspiration precautions.   Continue pulmonary hygiene. Dysphagia diet.   Enterobacter Colace bacteremia in the setting of UTI  right ureteral stone with mild hydronephrosis: Septic shock-resolved 1/30 was not a candidate for percutaneous  nephrostomy tube given elevated INR and thrombocytopenia.  S/p cystoscopy with right ureteral stent placement 1/30 Off of pressors now.  Fever improved. WBC improved. Cefepime  changed to Meropenem  due to encephalopathy. Continue antibiotics for total 14 days. He has PICC line placed 02/05/23.   Left iliopsoas intramuscular bleed Right renal cyst with hemorrhage  LUE hematoma -noted  on 02/08/23.  Associated with blood pressure cuff.  Status post PRBC transfusion 1/31. Hb down to 5.9 2/3 another unit transfusion. Hb today 7.3  Continue to monitor H&H daily.   Ileus  NGT removed 1/27, passing flatus Continues to have liquid BM. Continue rectal tube management   Hypernatremia: Improved. Stop  the fluids. He is eating better. Sodium 141 today   AKI: Baseline creatinine 0.9.  Worsened after IV diuresis and shock.  Mild right hydronephrosis with right renal calculi.  Status post stent placement on 1/30 by urology.  Renal function improving. Avoid nephrotoxic drugs. Monitor daily renal function.   Afib - on coumadin  Valvular disease s/p bioprosthetic AVR 2012, w/ MV and TV repair  CAD, non-obstructive  HFpEF: Remain euvolemic.  Continue to hold on beta-blocker and Coumadin .  Cardiology signed off on 02/06/2023. INR goal is 2-3.   Continue to hold on Coumadin  for next 1 to 2 days as above until repeat CT and stable h/h.   Anemia of chronic disease Acute on chronic Thrombocytopenia Coagulapathy on coumadin   Continue to hold on Coumadin .  Repeat INR ordered.   Moderate protein calorie malnutrition:  Dietitian on board Encourage oral diet, supplements.  Hyperglycemia- A1c 6.2 Sugars stable. Monitor accucheks, hypoglycemia protocol.     Out of bed to chair. Incentive spirometry. Nursing supportive care. Fall, aspiration precautions. DVT prophylaxis   Code Status: Limited: Do not attempt resuscitation (DNR) -DNR-LIMITED -Do Not Intubate/DNI   Subjective: Patient is seen and examined today morning.  He is sitting in chair, feels better. Patient is weak. Able to tolerate dysphagia diet.  Physical Exam: Vitals:   02/12/23 0701 02/12/23 0800 02/12/23 0907 02/12/23 1200  BP:  (!) 146/53    Pulse:  70    Resp:  20    Temp:  98 F (36.7 C)  97.8 F (36.6 C)  TempSrc:  Oral  Oral  SpO2:  95% 96%   Weight: 82.2 kg     Height:        General - Elderly ill looking Caucasian male, sitting, no apparent distress HEENT - PERRLA, EOMI, atraumatic head, non tender sinuses. Lung - Clear, basal rales, diffuse rhonchi, wheezes. Heart - S1, S2 heard, no murmurs, rubs, trace pedal edema. Abdomen - Soft, non tender, rectal tube intact. Neuro - Alert, awake and oriented,  follows simple commands. Skin - Warm and dry.  Data Reviewed:      Latest Ref Rng & Units 02/12/2023    3:00 AM 02/11/2023    6:51 PM 02/11/2023    7:30 AM  CBC  WBC 4.0 - 10.5 K/uL   12.8   Hemoglobin 13.0 - 17.0 g/dL 7.3  6.7  5.9   Hematocrit 39.0 - 52.0 % 22.6  21.5  19.0   Platelets 150 - 400 K/uL   67       Latest Ref Rng & Units 02/11/2023    5:23 AM 02/10/2023    4:53 AM 02/09/2023    7:02 AM  BMP  Glucose 70 - 99 mg/dL 473  824  806   BUN 8 - 23 mg/dL 41  53  66   Creatinine 0.61 - 1.24 mg/dL 8.62  8.68  8.57   Sodium 135 - 145 mmol/L 141  157  158   Potassium 3.5 - 5.1 mmol/L 3.0  3.6  4.2   Chloride 98 - 111 mmol/L 112  124  123   CO2 22 - 32 mmol/L 22  25  26  Calcium  8.9 - 10.3 mg/dL 7.3  8.9  8.8    No results found.  Family Communication: Discussed with patient, she understand and agree. All questions answereed.  Disposition: Status is: Inpatient Remains inpatient appropriate because: follow h/h,   Planned Discharge Destination: Home with Home Health palliative care follow up, oncology follow up     Time spent  Author: Concepcion Riser, MD 02/12/2023 1:30 PM Secure chat 7am to 7pm For on call review [email or web address].

## 2023-02-12 NOTE — TOC Progression Note (Signed)
 Transition of Care (TOC) - Progression Note   Patient Details  Name: [name]. MRN: [id number] Date of Birth: [birth year]  Transition of Care [facility]) CM/SW Contact  Duwaine GORMAN Aran, LCSW Phone Number: 02/12/2023, 11:49 AM  Clinical Narrative: CSW provided patient with the following bed offers with Medicare star ratings:  [facility] [address] [place], [place] [address] [phone] Overall rating ??  Below average  [facility] [address] [place], [place] [address] [phone] Overall rating ?? Below average  [facility] [address] [place], [place] [address] [phone] Overall rating ????? Much above average  [facility] [address] [place], [place] [address] [phone] Overall rating ???? Above average  [facility] [address] [place], [place] [address] [phone] Overall rating ? Much below average  Patient would like to review the offers with his spouse and son before making a decision.  Expected Discharge Plan: Skilled Nursing Facility Barriers to Discharge: Continued Medical Work up, SNF Pending bed offer, English As A Second Language Teacher  Expected Discharge Plan and Services In-house Referral: Clinical Social Work Living arrangements for the past 2 months: Single Family Home  Social Determinants of Health (SDOH) Interventions SDOH Screenings   Food Insecurity: No Food Insecurity (02/03/2023)  Housing: Low Risk  (02/03/2023)  Transportation Needs: No Transportation Needs (02/03/2023)  Utilities: Not At Risk (02/03/2023)  Alcohol  Screen: Low Risk  (06/02/2021)  Depression (PHQ2-9): Low Risk  (06/16/2021)  Financial Resource Strain: Low Risk  (06/16/2021)  Social Connections: Unknown (02/03/2023)  Stress: No Stress Concern Present (06/16/2021)  Tobacco Use: Low Risk  (02/07/2023)   Readmission Risk  Interventions    02/11/2023    4:00 PM 11/10/2021    3:54 PM  Readmission Risk Prevention Plan  Transportation Screening Complete Complete  PCP or Specialist Appt within 3-5 Days Not Complete Complete  Not Complete comments Patient will discharge to SNF.   HRI or Home Care Consult Complete Complete  Social Work Consult for Recovery Care Planning/Counseling Complete Complete  Palliative Care Screening Not Applicable Not Applicable  Medication Review Oceanographer) Complete Complete

## 2023-02-13 ENCOUNTER — Inpatient Hospital Stay (HOSPITAL_COMMUNITY): Payer: Medicare Other

## 2023-02-13 DIAGNOSIS — J9601 Acute respiratory failure with hypoxia: Secondary | ICD-10-CM | POA: Diagnosis not present

## 2023-02-13 LAB — GLUCOSE, CAPILLARY
Glucose-Capillary: 112 mg/dL — ABNORMAL HIGH (ref 70–99)
Glucose-Capillary: 115 mg/dL — ABNORMAL HIGH (ref 70–99)
Glucose-Capillary: 119 mg/dL — ABNORMAL HIGH (ref 70–99)
Glucose-Capillary: 143 mg/dL — ABNORMAL HIGH (ref 70–99)
Glucose-Capillary: 153 mg/dL — ABNORMAL HIGH (ref 70–99)
Glucose-Capillary: 161 mg/dL — ABNORMAL HIGH (ref 70–99)

## 2023-02-13 LAB — BASIC METABOLIC PANEL
Anion gap: 5 (ref 5–15)
Anion gap: 5 (ref 5–15)
BUN: 36 mg/dL — ABNORMAL HIGH (ref 8–23)
BUN: 30 mg/dL — ABNORMAL HIGH (ref 8–23)
CO2: 21 mmol/L — ABNORMAL LOW (ref 22–32)
CO2: 22 mmol/L (ref 22–32)
Calcium: 8 mg/dL — ABNORMAL LOW (ref 8.9–10.3)
Calcium: 8.2 mg/dL — ABNORMAL LOW (ref 8.9–10.3)
Chloride: 124 mmol/L — ABNORMAL HIGH (ref 98–111)
Chloride: 125 mmol/L — ABNORMAL HIGH (ref 98–111)
Creatinine, Ser: 1.11 mg/dL (ref 0.61–1.24)
Creatinine, Ser: 1.08 mg/dL (ref 0.61–1.24)
GFR, Estimated: >60 mL/min (ref >60)
GFR, Estimated: >60 mL/min (ref >60)
Glucose, Bld: 130 mg/dL — ABNORMAL HIGH (ref 70–99)
Glucose, Bld: 249 mg/dL — ABNORMAL HIGH (ref 70–99)
Potassium: 3.1 mmol/L — ABNORMAL LOW (ref 3.5–5.1)
Potassium: 3.1 mmol/L — ABNORMAL LOW (ref 3.5–5.1)
Sodium: 150 mmol/L — ABNORMAL HIGH (ref 135–145)
Sodium: 152 mmol/L — ABNORMAL HIGH (ref 135–145)

## 2023-02-13 LAB — CBC
HCT: 23.4 % — ABNORMAL LOW (ref 39.0–52.0)
Hemoglobin: 7.2 g/dL — ABNORMAL LOW (ref 13.0–17.0)
MCH: 28.2 pg (ref 26.0–34.0)
MCHC: 30.8 g/dL (ref 30.0–36.0)
MCV: 91.8 fL (ref 80.0–100.0)
Platelets: 84 10*3/uL — ABNORMAL LOW (ref 150–400)
RBC: 2.55 MIL/uL — ABNORMAL LOW (ref 4.22–5.81)
RDW: 18.9 % — ABNORMAL HIGH (ref 11.5–15.5)
WBC: 11.6 10*3/uL — ABNORMAL HIGH (ref 4.0–10.5)
nRBC: 0.2 % (ref 0.0–0.2)

## 2023-02-13 MED ORDER — SODIUM CHLORIDE 0.9 % IV SOLN
1.0000 g | Freq: Three times a day (TID) | INTRAVENOUS | Status: AC
  Administered 2023-02-13 – 2023-02-17 (×14): 1 g via INTRAVENOUS
  Filled 2023-02-13 (×14): qty 20

## 2023-02-13 MED ORDER — DEXTROSE 5 % IV SOLN: INTRAVENOUS | Status: DC

## 2023-02-13 MED ORDER — HEPARIN (PORCINE) 25000 UT/250ML-% IV SOLN
1950.0000 [IU]/h | INTRAVENOUS | Status: DC
  Administered 2023-02-13: 900 [IU]/h via INTRAVENOUS
  Administered 2023-02-14: 1250 [IU]/h via INTRAVENOUS
  Administered 2023-02-15: 1650 [IU]/h via INTRAVENOUS
  Administered 2023-02-17: 1950 [IU]/h via INTRAVENOUS
  Administered 2023-02-17: 1800 [IU]/h via INTRAVENOUS
  Administered 2023-02-18: 1950 [IU]/h via INTRAVENOUS
  Filled 2023-02-13 (×8): qty 250

## 2023-02-13 MED ORDER — POTASSIUM CHLORIDE CRYS ER 20 MEQ PO TBCR
40.0000 meq | EXTENDED_RELEASE_TABLET | ORAL | Status: AC
Start: 2023-02-13 — End: 2023-02-14
  Administered 2023-02-13: 40 meq via ORAL
  Filled 2023-02-13 (×2): qty 2

## 2023-02-13 NOTE — Progress Notes (Signed)
 PROGRESS NOTE    [name].  FMW:[id number] DOB: 1935/11/30 DOA: 02/02/2023 PCP: Katina Pfeiffer, PA-C  Chief Complaint  Patient presents with   Shortness of Breath    Brief Narrative:   [name]. is [name] 88 y.o. male who has an extensive PMH as below, significant for CAD, CVA, aortic valve replacement 2012, hypertension, pulmonary hypertension, atrial fibrillation and small bowel obstruction  . He presented to [facility] ED 1/25 with dyspnea that woke him from his sleep the night prior. He had also complained of LE edema over the preceding few weeks. On EMS arrival, he had sats of 85% on room air. He was placed on [place] O2 and given Solumedrol and DuoNebs in route to ED.   In ED, CXR was suggestive of mild edema. He was admitted by TRH and started on diuresis. He was seen by cardiology in consultation who agreed with diuresis for fluid overload and obtaining an echo. Echo was obtained and demonstrated EF 60-65%, mod LVH, RV pressure overload with RVSP 61, LA and RA severely dilated.   On evening of 1/26, he developed nausea and vomiting with probable aspiration event. There was concern for SBO and was seen by general surgery who recommended continuing NPO and continuing NGT to suction.   On 1/27, diuresis was held due to bump in renal function after IV lasix . He was only net negative since admit. He did not appear to have excess volume per cardiology notes and appeared improved from CHF standpoint. Dyspnea and hypoxia were felt to more likely be related to aspiration PNA.  General surgery had also seen and he had benign exam and was passing flatus. He unfortunately pulled out his NGT that day.   After pulling out NGT 1/27, he had episode of tachypnea and increased WOB along with spike in temp to 102.136F. Due to increased WOB, PCCM asked to see in consultation. He had been on 4L [place] prior to this episode.   Blood cultures from admission also returned positive for Enterobacter  cloacae. He was on Cefepime  escalated to Meropenem .  Family report concerns of pt aspirating for over [name] week prior to admit. Further GOC clarified with pt's son, [name]/ HPOA, changed to DNR/ DNI.    Off vasopressors since 1/30. Improving renal function. Hgb down to 6.2, plts 23 overnight, transfused PRBC and plts. Currently no complaints, has multiple concerns about his nutrition/ diet.  Developed left upper extremity hematoma 20-25 cm.  Received blood products.  Sig Events 1/25 admit 1/27 pulled out NGT and had episode of respiratory distress along with spike in temp to 102.136F. PCCM consulted. 1/27 Blood cultures returned positive for Enterobacter cloacae. 1/27 worsening shock, started on NE with escalating doses, comfort meds placed 1/28 much improved, more awake and interactive, NE down to 9. CVP 6, coox 85. 1/28 discussion with pt and family regarding eating. Had failed SLP eval but pt adamant to eat. Discussed risks/benefits and agreed to let pt drink liquids with supervision, if does well then can move to regular diet 1/29 HFNC 4L, tolerating ensure, weaning NE.  Urology consulted for renal CT showing mild right hydronephrosis with 12mm right proximal ureteral stone, hemorrhage 1/30 cystoscopy with right ureteral stent placement 2/1 MRI with acute L frontal and L temporal white matter infarcts  Assessment & Plan:   Principal Problem:   Acute respiratory failure with hypoxia [facility]) Active Problems:   Essential hypertension   Permanent atrial fibrillation (HCC)   CAD (coronary artery  disease)   s/p mitral valve repair   Anemia due to chronic blood loss   Benign prostatic hyperplasia with lower urinary tract symptoms   Thrombocytopenia (HCC)   Gout   Hypokalemia   Left hemiparesis (HCC)   S/P AVR (aortic valve replacement)   History of ischemic right MCA stroke   Hyperglycemia   S/P tricuspid valve repair   S/P Maze operation for atrial fibrillation   Prediabetes    Protein-calorie malnutrition, moderate (HCC)   HLD (hyperlipidemia)   Other secondary pulmonary hypertension (HCC)   Elevated troponin   Volume overload   Aspiration pneumonia (HCC)   DNR (do not resuscitate)   Ureteral calculus, right   Nephrolithiasis   Sepsis due to Enterobacter [facility])   Renal cyst  Acute ischemic stroke: Thought to be related to FFP/platelet infusion MRI brain on 2/1 showed small acute left frontal and left temporal white matter infarct. MR angio showed severe stenosis of right P2 PCA Neurology follow up appreciated -> 02/10/2023 note Repeat head CT without evidence of hemorrhagic conversion  Seen by SLP- dysphagia 2 diet.   Acute hypoxic respiratory failure-multifactorial in the setting of aspiration pneumonia, fluid overload/CHF exacerbation: 02/09/2023: Off of oxygen. Continue aspiration precautions.   Continue pulmonary hygiene. Dysphagia diet.   Enterobacter Colace bacteremia in the setting of UTI  right ureteral stone with mild hydronephrosis: Septic shock-resolved 1/30 was not Mathis Cashman candidate for percutaneous  nephrostomy tube given elevated INR and thrombocytopenia.  S/p cystoscopy with right ureteral stent placement 1/30 Off of pressors now.  Fever improved. WBC improved. Cefepime  changed to Meropenem  due to encephalopathy. Continue antibiotics for total 14 days. He has PICC line placed 02/05/23.   Left iliopsoas intramuscular bleed Right renal cyst with hemorrhage  LUE Ecchymoses Discussed renal cyst hemorrhage with urology, ok with resuming anticoagulation Will restart cautiously on heparin , low threshold to hold   Ileus  NGT removed 1/27, passing flatus Rectal tube is no longer in place   Hypernatremia: Hypotonic fluids.  Encourage free water .     AKI: Baseline creatinine 0.9.  Worsened after IV diuresis and shock.  Mild right hydronephrosis with right renal calculi.  Status post stent placement on 1/30 by urology.  Renal function improved.    Afib - on coumadin  Valvular disease s/p bioprosthetic AVR 2012, w/ MV and TV repair  CAD, non-obstructive  HFpEF: Remain euvolemic.  Continue to hold on beta-blocker and Coumadin .  Cardiology signed off on 02/06/2023. INR goal is 2-3.  INR subtherapeutic since 2/2.  Starting heparin  cautiously today.    Anemia of chronic disease Acute on chronic Thrombocytopenia Coagulapathy on coumadin   S/p 2 units platelets, 1 unit FFP, 4 units pRBC's  With repeat head CT this AM without evidence of hemorrhagic conversion, will plan to start heparin  cautiously  Cardiology notably has recommended heparin  bridge for INR <2 Per oncology, anemia thought due to sepsis, bleeding, renal failure, and phlebotomy - Hb expected to improve over next several weeks    Moderate protein calorie malnutrition:  Dietitian on board Encourage oral diet, supplements.   Hyperglycemia- A1c 6.2 SSI     DVT prophylaxis: SCD Code Status: DNR Family Communication: noen Disposition:   Status is: Inpatient Remains inpatient appropriate because: need for continued inpatient care   Consultants:  Cardiology s/o 02/06/2023 Urology Neurology Oncology surgery  Procedures:   02/07/2023 Cystoscopy right ureteral stent placement right retrograde pyelography with interpretation   Echo IMPRESSIONS     1. S/P MV repair with mean gradient 8  mmHg and no MR; s/p AVR with mean  gradient 14 mmHg and no AI; s/p TV repair with mild TR.   2. Left ventricular ejection fraction, by estimation, is 60 to 65%. The  left ventricle has normal function. The left ventricle has no regional  wall motion abnormalities. There is moderate left ventricular hypertrophy.  Left ventricular diastolic  parameters are indeterminate. There is the interventricular septum is  flattened in systole and diastole, consistent with right ventricular  pressure and volume overload.   3. Right ventricular systolic function is normal. The right  ventricular  size is mildly enlarged. There is severely elevated pulmonary artery  systolic pressure.   4. Left atrial size was severely dilated.   5. Right atrial size was severely dilated.   6. The mitral valve has been repaired/replaced. No evidence of mitral  valve regurgitation. No evidence of mitral stenosis. There is Quince Santana prosthetic  annuloplasty ring present in the mitral position.   7. The tricuspid valve is has been repaired/replaced. The tricuspid valve  is status post repair with an annuloplasty ring.   8. The aortic valve has been repaired/replaced. Aortic valve  regurgitation is not visualized. No aortic stenosis is present. There is Theseus Birnie  bioprosthetic valve present in the aortic position.   9. The inferior vena cava is dilated in size with <50% respiratory  variability, suggesting right atrial pressure of 15 mmHg.   Carotid US  Summary:  Right Carotid: Velocities in the right ICA are consistent with Liberty Seto 1-39%  stenosis.   Left Carotid: Velocities in the left ICA are consistent with Yarixa Lightcap 1-39%  stenosis.   Vertebrals: Bilateral vertebral arteries demonstrate antegrade flow.  Subclavians: Normal flow hemodynamics were seen in bilateral subclavian               arteries.    Antimicrobials:  Anti-infectives (From admission, onward)    Start     Dose/Rate Route Frequency Ordered Stop   02/13/23 1130  meropenem  (MERREM ) 1 g in sodium chloride  0.9 % 100 mL IVPB        1 g 200 mL/hr over 30 Minutes Intravenous Every 8 hours 02/13/23 1043     02/09/23 2200  meropenem  (MERREM ) 1 g in sodium chloride  0.9 % 100 mL IVPB  Status:  Discontinued        1 g 200 mL/hr over 30 Minutes Intravenous Every 12 hours 02/09/23 1402 02/13/23 1043   02/08/23 1100  ceFEPIme  (MAXIPIME ) 2 g in sodium chloride  0.9 % 100 mL IVPB  Status:  Discontinued        2 g 200 mL/hr over 30 Minutes Intravenous Every 12 hours 02/08/23 1001 02/09/23 1401   02/06/23 1700  ceFEPIme  (MAXIPIME ) 2 g in sodium chloride   0.9 % 100 mL IVPB  Status:  Discontinued        2 g 200 mL/hr over 30 Minutes Intravenous Every 24 hours 02/06/23 0917 02/08/23 1001   02/05/23 1700  meropenem  (MERREM ) 500 mg in sodium chloride  0.9 % 100 mL IVPB  Status:  Discontinued        500 mg 200 mL/hr over 30 Minutes Intravenous Every 12 hours 02/05/23 0718 02/06/23 0908   02/04/23 1700  meropenem  (MERREM ) 1 g in sodium chloride  0.9 % 100 mL IVPB  Status:  Discontinued        1 g 200 mL/hr over 30 Minutes Intravenous Every 12 hours 02/04/23 1457 02/05/23 0718   02/04/23 1200  Ampicillin -Sulbactam (UNASYN ) 3 g in sodium chloride   0.9 % 100 mL IVPB  Status:  Discontinued        3 g 200 mL/hr over 30 Minutes Intravenous Every 12 hours 02/04/23 0203 02/04/23 0944   02/04/23 1100  ceFEPIme  (MAXIPIME ) 2 g in sodium chloride  0.9 % 100 mL IVPB  Status:  Discontinued        2 g 200 mL/hr over 30 Minutes Intravenous Every 24 hours 02/04/23 0945 02/04/23 1456   02/03/23 1200  Ampicillin -Sulbactam (UNASYN ) 3 g in sodium chloride  0.9 % 100 mL IVPB  Status:  Discontinued        3 g 200 mL/hr over 30 Minutes Intravenous Every 6 hours 02/03/23 1115 02/04/23 0203       Subjective: No new complaints, asking to be sat up  Objective: Vitals:   02/12/23 2259 02/13/23 0418 02/13/23 0907 02/13/23 1226  BP: (!) 163/59 (!) 167/58  (!) 163/71  Pulse: 69 (!) 52  73  Resp: 16 (!) 22  19  Temp: 97.9 F (36.6 C) 97.6 F (36.4 C)  97.7 F (36.5 C)  TempSrc: Oral Oral  Oral  SpO2: 97% 97% 97% 99%  Weight:  74.6 kg    Height:        Intake/Output Summary (Last 24 hours) at 02/13/2023 1858 Last data filed at 02/13/2023 1700 Gross per 24 hour  Intake 820 ml  Output 750 ml  Net 70 ml   Filed Weights   02/11/23 0500 02/12/23 0701 02/13/23 0418  Weight: 82.2 kg 82.2 kg 74.6 kg    Examination:  General exam: frail appearing  Respiratory system: unlabored Cardiovascular system: RRR Gastrointestinal system: Abdomen is nondistended, soft and  nontender Central nervous system: Alert. Moving all extremities.  Extremities: no LEE    Data Reviewed: I have personally reviewed following labs and imaging studies  CBC: Recent Labs  Lab 02/06/23 2216 02/07/23 0527 02/07/23 2141 02/08/23 0620 02/08/23 1831 02/09/23 0443 02/10/23 0453 02/11/23 0730 02/11/23 1851 02/12/23 0300 02/13/23 0015  WBC 10.8*   < > 12.7*   < > 11.2* 13.1* 15.2* 12.8*  --   --  11.6*  NEUTROABS 9.0*  --  9.6*  --   --   --   --   --   --   --   --   HGB 7.8*   < > 6.2*   < > 7.3* 7.6* 7.2* 5.9* 6.7* 7.3* 7.2*  HCT 25.3*   < > 19.9*   < > 23.2* 23.8* 22.6* 19.0* 21.5* 22.6* 23.4*  MCV 94.8   < > 95.2   < > 95.1 94.4 94.6 95.5  --   --  91.8  PLT 32*   < > 23*   < > 41* 43* 57* 67*  --   --  84*   < > = values in this interval not displayed.    Basic Metabolic Panel: Recent Labs  Lab 02/07/23 0527 02/07/23 9164 02/07/23 1717 02/08/23 9379 02/09/23 0443 02/09/23 0702 02/10/23 0453 02/11/23 0523 02/13/23 0015 02/13/23 1633  NA 156*   < > 154* 145 158* 158* 157* 141 150* 152*  K 4.2   < > 4.9 3.6 4.4 4.2 3.6 3.0* 3.1* 3.1*  CL 123*   < > 123* 113* 124* 123* 124* 112* 124* 125*  CO2 21*  --  22 23 26 26 25 22  21* 22  GLUCOSE 155*   < > 314* 484* 155* 193* 175* 526* 130* 249*  BUN 99*   < > 98*  80* 65* 66* 53* 41* 36* 30*  CREATININE 2.33*   < > 2.33* 1.83* 1.46* 1.42* 1.31* 1.37* 1.11 1.08  CALCIUM  8.9  --  8.2* 7.7* 8.7* 8.8* 8.9 7.3* 8.0* 8.2*  MG 2.8*  --   --  2.3 2.2  --  2.2 2.0  --   --   PHOS 1.5*  --  3.3 3.1 3.7  --  3.0 3.4  --   --    < > = values in this interval not displayed.    GFR: Estimated Creatinine Clearance: 52.8 mL/min (by C-G formula based on SCr of 1.08 mg/dL).  Liver Function Tests: Recent Labs  Lab 02/07/23 1717 02/08/23 0620  AST  --  48*  ALT  --  33  ALKPHOS  --  106  BILITOT  --  1.2  PROT  --  5.2*  ALBUMIN  2.2* 2.1*    CBG: Recent Labs  Lab 02/13/23 0013 02/13/23 0414 02/13/23 0738  02/13/23 1156 02/13/23 1620  GLUCAP 119* 143* 112* 161* 115*     Recent Results (from the past 240 hours)  Urine Culture     Status: Abnormal   Collection Time: 02/04/23  1:18 AM   Specimen: Urine, Random  Result Value Ref Range Status   Specimen Description   Final    URINE, RANDOM Performed at [facility], [address], [place], [place] [address]    Special Requests   Final    NONE Reflexed from [id number] Performed at [facility], [address], [place], [place] [address]    Culture (Anglia Blakley)  Final    >=100,000 COLONIES/mL ENTEROBACTER SPECIES Two isolates with different morphologies were identified as the same organism.The most resistant organism was reported. Performed at [facility], [address], [place], [place] [address]    Report Status 02/07/2023 FINAL  Final   Organism ID, Bacteria ENTEROBACTER SPECIES (Nery Frappier)  Final      Susceptibility   Enterobacter species - MIC*    CEFEPIME  <=0.12 SENSITIVE Sensitive     CIPROFLOXACIN  <=0.25 SENSITIVE Sensitive     GENTAMICIN <=1 SENSITIVE Sensitive     IMIPENEM 1 SENSITIVE Sensitive     NITROFURANTOIN <=16 SENSITIVE Sensitive     TRIMETH/SULFA <=20 SENSITIVE Sensitive     PIP/TAZO 8 SENSITIVE Sensitive ug/mL    * >=100,000 COLONIES/mL ENTEROBACTER SPECIES  MRSA Next Gen by PCR, Nasal     Status: None   Collection Time: 02/04/23  1:52 AM   Specimen: Nasal Mucosa; Nasal Swab  Result Value Ref Range Status   MRSA by PCR Next Gen NOT DETECTED NOT DETECTED Final    Comment: (NOTE) The GeneXpert MRSA Assay (FDA approved for NASAL specimens only), is one component of Libero Puthoff comprehensive MRSA colonization surveillance program. It is not intended to diagnose MRSA infection nor to guide or monitor treatment for MRSA infections. Test performance is not FDA approved in patients less than 36 years old. Performed at [facility], [address] [place], [place] [address]    Culture, blood (Routine X 2) w Reflex to ID Panel     Status: None   Collection Time: 02/06/23  6:18 PM   Specimen: BLOOD LEFT ARM  Result Value Ref Range Status   Specimen Description   Final    BLOOD LEFT ARM Performed at [facility], [address] [name], [place] [address]    Special Requests   Final    BOTTLES  DRAWN AEROBIC ONLY Blood Culture results may not be optimal due to an inadequate volume of blood received in culture bottles Performed at [facility], [address], [place], [place] [address]    Culture   Final    NO GROWTH 5 DAYS Performed at [facility], [address] [place], [place] [address]    Report Status 02/11/2023 FINAL  Final  Culture, blood (Routine X 2) w Reflex to ID Panel     Status: Abnormal   Collection Time: 02/06/23  6:18 PM   Specimen: BLOOD LEFT HAND  Result Value Ref Range Status   Specimen Description   Final    BLOOD LEFT HAND Performed at [facility], [address] [place], [place] [address]    Special Requests   Final    BOTTLES DRAWN AEROBIC ONLY Blood Culture adequate volume Performed at [facility], [address] [place], [place] [address]    Culture  Setup Time   Final    GRAM POSITIVE COCCI AEROBIC BOTTLE ONLY CRITICAL RESULT CALLED TO, READ BACK BY AND VERIFIED WITH: M LILLISTON,PHARMD@0623  02/09/23 MK    Culture (Naji Mehringer)  Final    STAPHYLOCOCCUS EPIDERMIDIS THE SIGNIFICANCE OF ISOLATING THIS ORGANISM FROM Aymara Sassi SINGLE SET OF BLOOD CULTURES WHEN MULTIPLE SETS ARE DRAWN IS UNCERTAIN. PLEASE NOTIFY THE MICROBIOLOGY DEPARTMENT WITHIN ONE WEEK IF SPECIATION AND SENSITIVITIES ARE REQUIRED. Performed at [facility], [address], [place], [place] [address]    Report Status 02/10/2023 FINAL  Final  Blood Culture ID Panel (Reflexed)     Status: Abnormal   Collection Time: 02/06/23  6:18 PM  Result Value Ref Range Status   Enterococcus faecalis NOT DETECTED NOT  DETECTED Final   Enterococcus Faecium NOT DETECTED NOT DETECTED Final   Listeria monocytogenes NOT DETECTED NOT DETECTED Final   Staphylococcus species DETECTED (Wallace Cogliano) NOT DETECTED Final    Comment: CRITICAL RESULT CALLED TO, READ BACK BY AND VERIFIED WITH: M LILLISTON,PHARMD@0623  02/09/23 MK    Staphylococcus aureus (BCID) NOT DETECTED NOT DETECTED Final   Staphylococcus epidermidis DETECTED (Laney Bagshaw) NOT DETECTED Final    Comment: Methicillin (oxacillin) resistant coagulase negative staphylococcus. Possible blood culture contaminant (unless isolated from more than one blood culture draw or clinical case suggests pathogenicity). No antibiotic treatment is indicated for blood  culture contaminants. CRITICAL RESULT CALLED TO, READ BACK BY AND VERIFIED WITH: M LILLISTON,PHARMD@0623  02/09/23 MK    Staphylococcus lugdunensis NOT DETECTED NOT DETECTED Final   Streptococcus species NOT DETECTED NOT DETECTED Final   Streptococcus agalactiae NOT DETECTED NOT DETECTED Final   Streptococcus pneumoniae NOT DETECTED NOT DETECTED Final   Streptococcus pyogenes NOT DETECTED NOT DETECTED Final   Isael Stille.calcoaceticus-baumannii NOT DETECTED NOT DETECTED Final   Bacteroides fragilis NOT DETECTED NOT DETECTED Final   Enterobacterales NOT DETECTED NOT DETECTED Final   Enterobacter cloacae complex NOT DETECTED NOT DETECTED Final   Escherichia coli NOT DETECTED NOT DETECTED Final   Klebsiella aerogenes NOT DETECTED NOT DETECTED Final   Klebsiella oxytoca NOT DETECTED NOT DETECTED Final   Klebsiella pneumoniae NOT DETECTED NOT DETECTED Final   Proteus species NOT DETECTED NOT DETECTED Final   Salmonella species NOT DETECTED NOT DETECTED Final   Serratia marcescens NOT DETECTED NOT DETECTED Final   Haemophilus influenzae NOT DETECTED NOT DETECTED Final   Neisseria meningitidis NOT DETECTED NOT DETECTED Final   Pseudomonas aeruginosa NOT DETECTED NOT DETECTED Final   Stenotrophomonas maltophilia NOT DETECTED NOT DETECTED  Final  Candida albicans NOT DETECTED NOT DETECTED Final   Candida auris NOT DETECTED NOT DETECTED Final   Candida glabrata NOT DETECTED NOT DETECTED Final   Candida krusei NOT DETECTED NOT DETECTED Final   Candida parapsilosis NOT DETECTED NOT DETECTED Final   Candida tropicalis NOT DETECTED NOT DETECTED Final   Cryptococcus neoformans/gattii NOT DETECTED NOT DETECTED Final   Methicillin resistance mecA/C DETECTED (Porscha Axley) NOT DETECTED Final    Comment: CRITICAL RESULT CALLED TO, READ BACK BY AND VERIFIED WITH: M LILLISTON,PHARMD@0623  02/09/23 MK Performed at [facility], [address], [place], [place] [address]   Urine Culture     Status: None   Collection Time: 02/07/23  8:42 AM   Specimen: Urine, Cystoscope  Result Value Ref Range Status   Specimen Description   Final    URINE, CLEAN CATCH Performed at [facility], [address], [place], [place] [address]    Special Requests   Final    URINE CYSTOSCOPY Performed at [facility], [address] [place], [place] [address]    Culture   Final    NO GROWTH Performed at [facility], [address], [place], [place] [address]    Report Status 02/08/2023 FINAL  Final         Radiology Studies: CT HEAD WO CONTRAST ( ) Result Date: 02/13/2023 CLINICAL DATA:  Provided history: Stroke, follow-up. EXAM: CT HEAD WITHOUT CONTRAST TECHNIQUE: Contiguous axial images were obtained from the base of the skull through the vertex without intravenous contrast. RADIATION DOSE REDUCTION: This exam was performed according to the departmental dose-optimization program which includes automated exposure control, adjustment of the mA and/or kV according to patient size and/or use of iterative reconstruction technique. COMPARISON:  Brain MRI 02/09/2023.  Noncontrast head CT 02/09/2023. FINDINGS: Brain: Generalized cerebral atrophy. Known small acute white matter infarct within the posterior left  frontal lobe. Ziare Orrick known small acute white matter infarct within the posterior left temporal lobe is occult by CT and was better appreciated on the recent prior brain MRI of 02/09/2023. Chronic right MCA territory and right cerebellar infarcts again noted. Mild patchy and ill-defined hypoattenuation elsewhere within the cerebral white matter, nonspecific but compatible chronic small vessel disease. There is no acute intracranial hemorrhage. No evidence of an intracranial mass. No midline shift. Vascular: No hyperdense vessel.  Atherosclerotic calcifications. Skull: No calvarial fracture or aggressive osseous lesion. Sinuses/Orbits: No mass or acute finding within the imaged orbits. No significant paranasal sinus disease at the imaged levels. Other: Right mastoid effusion. IMPRESSION: 1. Known small acute white matter infarcts within the left frontal and left temporal lobes, not appreciably changed in extent from the recent prior brain MRI of 02/09/2023. No evidence of hemorrhagic conversion. 2. Parenchymal atrophy, chronic small vessel ischemic disease and chronic infarcts, as described. 3. Right mastoid effusion. Electronically Signed   By: Rockey Childs D.O.   On: 02/13/2023 12:37        Scheduled Meds:  sodium chloride    Intravenous Once   allopurinol   50 mg Oral q morning   arformoterol   15 mcg Nebulization BID   budesonide  (PULMICORT ) nebulizer solution  0.5 mg Nebulization BID   Chlorhexidine  Gluconate Cloth  6 each Topical Daily   docusate  100 mg Oral Daily   feeding supplement  1 Bottle Oral TID BM   insulin  aspart  0-9 Units Subcutaneous Q4H   ipratropium-albuterol   3 mL Nebulization BID   multivitamin with minerals  1 tablet Oral  Daily   mouth rinse  15 mL Mouth Rinse 4 times per day   pantoprazole  (PROTONIX ) IV  40 mg Intravenous Q12H   sodium chloride  flush  10-40 mL Intracatheter Q12H   sodium chloride  flush  3-10 mL Intravenous Q12H   Continuous Infusions:  sodium chloride  Stopped  (02/07/23 1036)   dextrose  40 mL/hr at 02/13/23 1107   meropenem  (MERREM ) IV 1 g (02/13/23 1302)     LOS: 10 days    Time spent: over 30 min    Meliton Monte, MD [facility]   To contact the attending provider between 7A-7P or the covering provider during after hours 7P-7A, please log into the web site [email or web address] and access using universal [place] password for that web site. If you do not have the password, please call the hospital operator.  02/13/2023, 6:58 PM

## 2023-02-13 NOTE — Progress Notes (Signed)
 PHARMACY - ANTICOAGULATION CONSULT NOTE  Pharmacy Consult for Heparin  Indication: atrial fibrillation and stroke  Allergies  Allergen Reactions   Sulfa Antibiotics Other (See Comments)    Granulocytosis   Sulfamethoxazole-Trimethoprim     Other Reaction(s): Unknown   Zestril  [Lisinopril ] Cough    Patient Measurements: Height: 6' (182.9 cm) Weight: 74.6 kg (164 lb 7.4 oz) IBW/kg (Calculated) : 77.6 Heparin  Dosing Weight: TBW  Vital Signs: Temp: 97.7 F (36.5 C) (02/05 1226) Temp Source: Oral (02/05 1226) BP: 163/71 (02/05 1226) Pulse Rate: 73 (02/05 1226)  Labs: Recent Labs    02/11/23 0523 02/11/23 0730 02/11/23 0730 02/11/23 1851 02/12/23 0300 02/12/23 1525 02/13/23 0015 02/13/23 1633  HGB  --  5.9*   < > 6.7* 7.3*  --  7.2*  --   HCT  --  19.0*   < > 21.5* 22.6*  --  23.4*  --   PLT  --  67*  --   --   --   --  84*  --   LABPROT 21.4*  --   --   --   --  17.9*  --   --   INR 1.8*  --   --   --   --  1.5*  --   --   CREATININE 1.37*  --   --   --   --   --  1.11 1.08   < > = values in this interval not displayed.    Estimated Creatinine Clearance: 52.8 mL/min (by C-G formula based on SCr of 1.08 mg/dL).   Medical History: Past Medical History:  Diagnosis Date   Acquired dilation of ascending aorta and aortic root (HCC)    43mm aortic root and 41mm ascending aorta on echo 03/2020   Arthritis    Bladder stones    Borderline diabetes    BPH (benign prostatic hyperplasia)    CAD (coronary artery disease) 12/19/2020   Non-obstructive coronary artery disease at cath in 2019   Complication of anesthesia    problem with voiding after anesthesia,   Coronary artery disease    cardiologist-  dr lovette mar gerhart NP--- per cath 06-02-2010 non-obstructive cad pLAD 30-40%   CVA (cerebral vascular accident) (HCC) 10/24/2016   Diverticulosis of colon    Dysrhythmia    afib   Gout    Heart murmur    History of adenomatous polyp of colon    2002-- tubular  adenoma   History of aortic insufficiency    severe -- s/p  AVR 08-03-2010   History of small bowel obstruction    02/ 2007 mechanical sbo s/p  surgical intervention;  partial sbo 09/ 2011 and 03-20-2011 resolved without surgical intervention   History of urinary retention    HTN (hypertension)    Other secondary pulmonary hypertension (HCC) 03/20/2022   TTE 03/19/22: EF 65-70, no RWMA, moderate LVH, normal RVSF, severe pulmonary hypertension (RVSP 60.4), severe BAE, normal structure and function of mitral valve repair (mean gradient 9), mild TR, trivial AI, normal structure and function of AVR (mean 15.4), moderate PI, aortic root and ascending aorta 40 mm, RAP 15   Peripheral neuropathy    Persistent atrial fibrillation (HCC)    Pre-diabetes    S/P aortic valve replacement with prosthetic valve 08/03/2010   tissue valve   S/P Maze operation for atrial fibrillation 01/30/2018   Complete bilateral atrial lesion set using bipolar radiofrequency and cryothermy with clipping of LA appendage   S/P MVR (mitral  valve repair) 01/30/2018   Complex valvuloplasty including artificial Gore-tex neochord placement x4 and Carbo medics Annuloflex ring annuloplasty, size 28   S/P patent foramen ovale closure 08/03/2010   at same time AVR   S/P tricuspid valve repair 01/30/2018   Using an MC3 Annuloplasty ring, size 28   Stroke (HCC)    Thrombocytopenia (HCC)    Tricuspid regurgitation     Medications:  Scheduled:   sodium chloride    Intravenous Once   allopurinol   50 mg Oral q morning   arformoterol   15 mcg Nebulization BID   budesonide  (PULMICORT ) nebulizer solution  0.5 mg Nebulization BID   Chlorhexidine  Gluconate Cloth  6 each Topical Daily   docusate  100 mg Oral Daily   feeding supplement  1 Bottle Oral TID BM   insulin  aspart  0-9 Units Subcutaneous Q4H   ipratropium-albuterol   3 mL Nebulization BID   multivitamin with minerals  1 tablet Oral Daily   mouth rinse  15 mL Mouth Rinse 4  times per day   pantoprazole  (PROTONIX ) IV  40 mg Intravenous Q12H   potassium chloride   40 mEq Oral Q4H   sodium chloride  flush  10-40 mL Intracatheter Q12H   sodium chloride  flush  3-10 mL Intravenous Q12H   Infusions:   sodium chloride  Stopped (02/07/23 1036)   dextrose  40 mL/hr at 02/13/23 1107   meropenem  (MERREM ) IV 1 g (02/13/23 1302)    Assessment: 87 yoM admitted on 1/25 with dyspnea and then developed SBO (1/26) with aspiration, E.cloacae bacteremia, shock, Left iliopsoas intramuscular bleed, LUE hematoma, ureteral stone with hemorrhage (1/29), cystoscopy with stent placement (1/30), MRI with acute infarcts (2/1).    PMH is significant for CAD, CVA, aortic valve replacement 2012, atrial fibrillation on warfarin, but was held for NPO status.  Last warfarin 5mg  given on 1/25.  Pharmacy is consulted to resume anticoagulation with Heparin  IV.    SCr down to 1.08 No INR today, but yesterday INR decreased to 1.5 CBC:  Hgb low/stable at 7.2, Plt up to 84k - Transfused PRBC on 1/29, PRBC/FFP/Plt on 1/31, and PRBC on 2/3    Goal of Therapy:  Heparin  level 0.3-0.5 units/ml Monitor platelets by anticoagulation protocol: Yes   Plan:  No heparin  bolus d/t recent stroke/bleeding Start heparin  IV infusion at 900 units/hr Heparin  level 8 hours after starting Daily heparin  level and CBC   Wanda Hasting PharmD, BCPS [facility] main pharmacy [phone] 02/13/2023 7:34 PM

## 2023-02-13 NOTE — Progress Notes (Signed)
 Speech Language Pathology Treatment: Dysphagia  Patient Details Name: [name]. MRN: [id number] DOB: 05-Mar-1935 [name] Date: 02/13/2023 Time: 9049-8967 SLP Time Calculation (min) (ACUTE ONLY): 42 min  Assessment / Plan / Recommendation Clinical Impression  Patient seen today to address dysphagia goals including initiating dysphagia swallowing exercises. SLP started patient on respiratory muscle strength training particular expiratory with his device set at 15 cm of water  pressure. Patient demonstrated excellent tolerance and implementation of exercise after a few trials. He conducted 10 trials at level 15 and 20 overall, admitted to fatigue following exercises but did not report any discomfort or shortness of breath. Using teach back patient able to verbalize indication and precaution of exercise. Also implemented effortful swallow and Masako swallow exercises with patient returning demonstration. Patient inquired regarding movement of the jaw side-to-side or jaw opening regarding helping swallowing. Given patient's gentle range of motion is within functional limits, do not recommend this exercise to as it will not benefit his swallowing. Patient still reports issues with xerostomia thus messaged dietitian requesting extra gravies and sauces and to be added to every meals for compensation. Patient is inquiring about consuming ensures 3 times a day as he reports concerns for blood sugar issues, relayed this information to his dietitian who thankfully reported she would follow-up with him. Also spoke to RN about this who states patient is fine having 3 Ensure a day. Patient is making excellent progress and reports understanding of his swallowing exercises including purpose and indication. Will continue to follow.    HPI HPI: [name] admitted 02/02/23 with SOB 2/2 CHF exacerbation with possible aspiration event. PMH: acquired dilation of the ascending aorta and aortic root, CAD, AFib, Right basal  ganglia CVA (2018), HTN, heart murmur, AVR, OA, BPH, SBO, neuropathy, thrombocytopenia.  PMH+ for dysphagia from CVA 2018 requiring thickener and another CVA that did not impair his swallowing. Pt developed some respiratory difficulties this am - and was placed on 4 L HFNC - s/p surgery with urology stent and then sent from surgery to ICU on vasopressors and Bipap 1/30. Subsequently taken off Bipap and awoke adequately for po. MBS on 02/09/23 recommended full liquids/thin consistency however after returning from MBS, patient with s/s stroke and had MRI brain which showed Small acute left frontal and left temporal white matter infarcts.  Pt is s/p repeat evaluation after cva.  Follow up re: swallowing indicated.      SLP Plan  Continue with current plan of care      Recommendations for follow up therapy are one component of a multi-disciplinary discharge planning process, led by the attending physician.  Recommendations may be updated based on patient status, additional functional criteria and insurance authorization.    Recommendations  Diet recommendations: Dysphagia 2 (fine chop);Thin liquid Liquids provided via: Cup;No straw Medication Administration: Other (Comment) (Whole with Ensure per patient wishes) Supervision: Patient able to self feed Compensations: Small sips/bites;Slow rate Postural Changes and/or Swallow Maneuvers: Seated upright 90 degrees;Upright 30-60 min after meal                      Other (comment) Dysphagia, oropharyngeal phase (R13.12)     Continue with current plan of care   [name], MS [facility] SLP Acute Rehab Services Office [phone]   [name]  02/13/2023, 10:45 AM

## 2023-02-13 NOTE — TOC Progression Note (Signed)
 Transition of Care (TOC) - Progression Note    Patient Details  Name: [name]. MRN: [id number] Date of Birth: Sep 23, 1935  Transition of Care [facility]) CM/SW Contact  Tawni CHRISTELLA Eva, LCSW Phone Number: 02/13/2023, 12:08 PM  Clinical Narrative:    CSW met with the pt regarding SNF bed choice. The pt deferred the decision to his son. CSW spoke with Alexzander, who stated that he would like to take his father home. The pt's son has declined SNF placement and would like home health. Pt's son has no preference for a home health agency. The son stated that the pt has no DME needs, as the pt has a walker and cane at home. Pt will need home health orders placed. TOC to follow.     Expected Discharge Plan and Services In-house Referral: Clinical Social Work     Living arrangements for the past 2 months: Single Family Home                                       Social Determinants of Health (SDOH) Interventions SDOH Screenings   Food Insecurity: No Food Insecurity (02/03/2023)  Housing: Low Risk  (02/03/2023)  Transportation Needs: No Transportation Needs (02/03/2023)  Utilities: Not At Risk (02/03/2023)  Alcohol  Screen: Low Risk  (06/02/2021)  Depression (PHQ2-9): Low Risk  (06/16/2021)  Financial Resource Strain: Low Risk  (06/16/2021)  Social Connections: Unknown (02/03/2023)  Stress: No Stress Concern Present (06/16/2021)  Tobacco Use: Low Risk  (02/07/2023)    Readmission Risk Interventions    02/11/2023    4:00 PM 11/10/2021    3:54 PM  Readmission Risk Prevention Plan  Transportation Screening Complete Complete  PCP or Specialist Appt within 3-5 Days Not Complete Complete  Not Complete comments Patient will discharge to SNF.   HRI or Home Care Consult Complete Complete  Social Work Consult for Recovery Care Planning/Counseling Complete Complete  Palliative Care Screening Not Applicable Not Applicable  Medication Review Oceanographer) Complete Complete

## 2023-02-13 NOTE — Plan of Care (Signed)
  Problem: Education: Goal: Ability to demonstrate management of disease process will improve Outcome: Progressing Goal: Ability to verbalize understanding of medication therapies will improve Outcome: Progressing   Problem: Activity: Goal: Capacity to carry out activities will improve Outcome: Progressing   Problem: Cardiac: Goal: Ability to achieve and maintain adequate cardiopulmonary perfusion will improve Outcome: Progressing   Problem: Education: Goal: Knowledge of General Education information will improve Description: Including pain rating scale, medication(s)/side effects and non-pharmacologic comfort measures Outcome: Progressing   Problem: Health Behavior/Discharge Planning: Goal: Ability to manage health-related needs will improve Outcome: Progressing   Problem: Clinical Measurements: Goal: Ability to maintain clinical measurements within normal limits will improve Outcome: Progressing Goal: Will remain free from infection Outcome: Progressing Goal: Diagnostic test results will improve Outcome: Progressing Goal: Respiratory complications will improve Outcome: Progressing Goal: Cardiovascular complication will be avoided Outcome: Progressing   Problem: Activity: Goal: Risk for activity intolerance will decrease Outcome: Progressing   Problem: Nutrition: Goal: Adequate nutrition will be maintained Outcome: Progressing   Problem: Coping: Goal: Level of anxiety will decrease Outcome: Progressing   Problem: Elimination: Goal: Will not experience complications related to bowel motility Outcome: Progressing Goal: Will not experience complications related to urinary retention Outcome: Progressing   Problem: Pain Managment: Goal: General experience of comfort will improve and/or be controlled Outcome: Progressing   Problem: Safety: Goal: Ability to remain free from injury will improve Outcome: Progressing   Problem: Skin Integrity: Goal: Risk for  impaired skin integrity will decrease Outcome: Progressing   Problem: Education: Goal: Knowledge of disease or condition will improve Outcome: Progressing Goal: Knowledge of secondary prevention will improve (MUST DOCUMENT ALL) Outcome: Progressing Goal: Knowledge of patient specific risk factors will improve (DELETE if not current risk factor) Outcome: Progressing   Problem: Ischemic Stroke/TIA Tissue Perfusion: Goal: Complications of ischemic stroke/TIA will be minimized Outcome: Progressing   Problem: Coping: Goal: Will verbalize positive feelings about self Outcome: Progressing Goal: Will identify appropriate support needs Outcome: Progressing   Problem: Health Behavior/Discharge Planning: Goal: Ability to manage health-related needs will improve Outcome: Progressing Goal: Goals will be collaboratively established with patient/family Outcome: Progressing   Problem: Self-Care: Goal: Ability to participate in self-care as condition permits will improve Outcome: Progressing Goal: Verbalization of feelings and concerns over difficulty with self-care will improve Outcome: Progressing Goal: Ability to communicate needs accurately will improve Outcome: Progressing   Problem: Nutrition: Goal: Risk of aspiration will decrease Outcome: Progressing Goal: Dietary intake will improve Outcome: Progressing

## 2023-02-13 NOTE — Progress Notes (Signed)
 Nutrition Follow-up  DOCUMENTATION CODES:   Non-severe (moderate) malnutrition in context of chronic illness  INTERVENTION: - DYS 2 diet per SLP recs. - Ensure Plus High Protein po TID, each supplement provides 350 kcal and 20 grams of protein. - Continue Multivitamin with minerals daily - Monitor weight trends.   NUTRITION DIAGNOSIS:   Moderate Malnutrition related to chronic illness as evidenced by moderate fat depletion, severe muscle depletion. *ongoing  GOAL:   Patient will meet greater than or equal to 90% of their needs *progressing  MONITOR:   PO intake, Supplement acceptance, Diet advancement, Weight trends  REASON FOR ASSESSMENT:   Consult Assessment of nutrition requirement/status  ASSESSMENT:   88 y.o. male who has an PMH significant for CAD, CVA, HTN, pulmonary hypertension, CHF, and small bowel obstruction who presented with dyspnea. Admitted for acute respiratory failure due to aspiration PNA and CHF exacerbation.  Received secure chat from SLP that patient worried about having 3 Ensure a day due to blood sugars.   Presented to bedside to discuss with patient.  He reports concern with elevated blood sugars however patient noted to an A1C of 5.5 and he has had well controlled blood glucose over the past 24 hours, especially given advanced age. Discussed this with patient. Patient also noted to have occasionally been receiving D5 which could be elevating blood glucose levels. Reminded patient they are being checked and if they are elevated he is ordered insulin .   Encouraged patient to consume 3 Ensures daily if tolerated to better meet kcal/protein goals., Patient states this is what he wants to do just wanted to make sure he could.  Of note, patient has remained on full liquids since last follow up until yesterday, when he was able to advance to a DYS 2 diet per SLP recs. He is documented to be consuming 50-100% of meals. Patient notes he had pot roast  yesterday and tolerated well but notes his stomach was not used to having something to rich so wants to slowly transition to more solid food.     Admit weight: 173# Current weight: 164# I&O's: +433mL *Of note, patient weighed at 181# yesterday and at 164# today. Given such variance in weights difficult to assess weight trends at this time. Will monitor.  Medications reviewed and include: Colace, MVI, D5 @ 40mL/hr (provides 163 kcals over 24 hours)  Labs reviewed:  HA1C 6.2 Blood Glucose 112-154 x24 hours   Diet Order:   Diet Order             DIET DYS 2 Fluid consistency: Thin  Diet effective now                   EDUCATION NEEDS:  Education needs have been addressed  Skin:  Skin Assessment: Reviewed RN Assessment  Last BM:  2/3  Height:  Ht Readings from Last 1 Encounters:  02/07/23 6' (1.829 m)   Weight:  Wt Readings from Last 1 Encounters:  02/13/23 74.6 kg    BMI:  Body mass index is 22.31 kg/m.  Estimated Nutritional Needs:  Kcal:  1800-2050 kcals Protein:  85-100 grams Fluid:  >/= 1.8L    Trude Ned RD, LDN Contact via Secure Chat.

## 2023-02-14 DIAGNOSIS — J9601 Acute respiratory failure with hypoxia: Secondary | ICD-10-CM | POA: Diagnosis not present

## 2023-02-14 LAB — CBC WITH DIFFERENTIAL/PLATELET
Abs Immature Granulocytes: 0.09 10*3/uL — ABNORMAL HIGH (ref 0.00–0.07)
Basophils Absolute: 0 10*3/uL (ref 0.0–0.1)
Basophils Relative: 0 %
Eosinophils Absolute: 0.2 10*3/uL (ref 0.0–0.5)
Eosinophils Relative: 2 %
HCT: 22.9 % — ABNORMAL LOW (ref 39.0–52.0)
Hemoglobin: 7.2 g/dL — ABNORMAL LOW (ref 13.0–17.0)
Immature Granulocytes: 1 %
Lymphocytes Relative: 16 %
Lymphs Abs: 1.7 10*3/uL (ref 0.7–4.0)
MCH: 29.1 pg (ref 26.0–34.0)
MCHC: 31.4 g/dL (ref 30.0–36.0)
MCV: 92.7 fL (ref 80.0–100.0)
Monocytes Absolute: 0.5 10*3/uL (ref 0.1–1.0)
Monocytes Relative: 5 %
Neutro Abs: 8.1 10*3/uL — ABNORMAL HIGH (ref 1.7–7.7)
Neutrophils Relative %: 76 %
Platelets: 114 10*3/uL — ABNORMAL LOW (ref 150–400)
RBC: 2.47 MIL/uL — ABNORMAL LOW (ref 4.22–5.81)
RDW: 18.5 % — ABNORMAL HIGH (ref 11.5–15.5)
WBC: 10.6 10*3/uL — ABNORMAL HIGH (ref 4.0–10.5)
nRBC: 0 % (ref 0.0–0.2)

## 2023-02-14 LAB — COMPREHENSIVE METABOLIC PANEL
ALT: 30 U/L (ref 0–44)
AST: 33 U/L (ref 15–41)
Albumin: 2.1 g/dL — ABNORMAL LOW (ref 3.5–5.0)
Alkaline Phosphatase: 99 U/L (ref 38–126)
Anion gap: 6 (ref 5–15)
BUN: 25 mg/dL — ABNORMAL HIGH (ref 8–23)
CO2: 22 mmol/L (ref 22–32)
Calcium: 8.1 mg/dL — ABNORMAL LOW (ref 8.9–10.3)
Chloride: 122 mmol/L — ABNORMAL HIGH (ref 98–111)
Creatinine, Ser: 0.88 mg/dL (ref 0.61–1.24)
GFR, Estimated: >60 mL/min (ref >60)
Glucose, Bld: 146 mg/dL — ABNORMAL HIGH (ref 70–99)
Potassium: 3.3 mmol/L — ABNORMAL LOW (ref 3.5–5.1)
Sodium: 150 mmol/L — ABNORMAL HIGH (ref 135–145)
Total Bilirubin: 1.3 mg/dL — ABNORMAL HIGH (ref 0.0–1.2)
Total Protein: 6 g/dL — ABNORMAL LOW (ref 6.5–8.1)

## 2023-02-14 LAB — BASIC METABOLIC PANEL
Anion gap: 6 (ref 5–15)
Anion gap: 4 — ABNORMAL LOW (ref 5–15)
BUN: 18 mg/dL (ref 8–23)
BUN: 20 mg/dL (ref 8–23)
CO2: 19 mmol/L — ABNORMAL LOW (ref 22–32)
CO2: 22 mmol/L (ref 22–32)
Calcium: 7 mg/dL — ABNORMAL LOW (ref 8.9–10.3)
Calcium: 8 mg/dL — ABNORMAL LOW (ref 8.9–10.3)
Chloride: 107 mmol/L (ref 98–111)
Chloride: 122 mmol/L — ABNORMAL HIGH (ref 98–111)
Creatinine, Ser: 0.85 mg/dL (ref 0.61–1.24)
Creatinine, Ser: 0.86 mg/dL (ref 0.61–1.24)
GFR, Estimated: >60 mL/min (ref >60)
GFR, Estimated: >60 mL/min (ref >60)
Glucose, Bld: 692 mg/dL (ref 70–99)
Glucose, Bld: 115 mg/dL — ABNORMAL HIGH (ref 70–99)
Potassium: 2.8 mmol/L — ABNORMAL LOW (ref 3.5–5.1)
Potassium: 3.2 mmol/L — ABNORMAL LOW (ref 3.5–5.1)
Sodium: 132 mmol/L — ABNORMAL LOW (ref 135–145)
Sodium: 148 mmol/L — ABNORMAL HIGH (ref 135–145)

## 2023-02-14 LAB — MAGNESIUM: Magnesium: 2.3 mg/dL (ref 1.7–2.4)

## 2023-02-14 LAB — CBC
HCT: 22.3 % — ABNORMAL LOW (ref 39.0–52.0)
Hemoglobin: 7 g/dL — ABNORMAL LOW (ref 13.0–17.0)
MCH: 29 pg (ref 26.0–34.0)
MCHC: 31.4 g/dL (ref 30.0–36.0)
MCV: 92.5 fL (ref 80.0–100.0)
Platelets: 137 10*3/uL — ABNORMAL LOW (ref 150–400)
RBC: 2.41 MIL/uL — ABNORMAL LOW (ref 4.22–5.81)
RDW: 18.3 % — ABNORMAL HIGH (ref 11.5–15.5)
WBC: 10.5 10*3/uL (ref 4.0–10.5)
nRBC: 0 % (ref 0.0–0.2)

## 2023-02-14 LAB — GLUCOSE, CAPILLARY
Glucose-Capillary: 102 mg/dL — ABNORMAL HIGH (ref 70–99)
Glucose-Capillary: 112 mg/dL — ABNORMAL HIGH (ref 70–99)
Glucose-Capillary: 115 mg/dL — ABNORMAL HIGH (ref 70–99)
Glucose-Capillary: 118 mg/dL — ABNORMAL HIGH (ref 70–99)
Glucose-Capillary: 125 mg/dL — ABNORMAL HIGH (ref 70–99)
Glucose-Capillary: 143 mg/dL — ABNORMAL HIGH (ref 70–99)
Glucose-Capillary: 143 mg/dL — ABNORMAL HIGH (ref 70–99)

## 2023-02-14 LAB — HEPARIN LEVEL (UNFRACTIONATED)
Heparin Unfractionated: <0.1 [IU]/mL — ABNORMAL LOW (ref 0.30–0.70)
Heparin Unfractionated: <0.1 [IU]/mL — ABNORMAL LOW (ref 0.30–0.70)

## 2023-02-14 LAB — PROTIME-INR
INR: 1.4 — ABNORMAL HIGH (ref 0.8–1.2)
Prothrombin Time: 17.8 s — ABNORMAL HIGH (ref 11.4–15.2)

## 2023-02-14 LAB — PHOSPHORUS: Phosphorus: 2.7 mg/dL (ref 2.5–4.6)

## 2023-02-14 MED ORDER — DEXTROSE 5 % IV SOLN: INTRAVENOUS | Status: AC

## 2023-02-14 NOTE — Progress Notes (Signed)
 Critical lab called at 1943 of glucose 669. Paged NP on call and Dr. Ada Acres to be aware. Took CBG and it was 112. Dextrose  5% d/c'd at this time.

## 2023-02-14 NOTE — Progress Notes (Signed)
 Physical Therapy Treatment Patient Details Name: [name]. MRN: [id number] DOB: 1935/07/23 [name] Date: 02/14/2023   History of Present Illness Patient is a 88 year old male who presented on 1/25 with dyspnea and hypoxia. Patient was admited with concerns of SBO. 1/27 patient pulled out NG tube with increased work of breath and fever. 1/29 patient on HFNC with CT revealing mild right hydronephrosis with 12 mm right proximal ureteral stone.1/31 patient was taken off vasopressors and received PRBCs. 2/1 patient found to have left side weakness with code stroke called. MRI revealed L MCA. PMH: R TKR, L THR, L TKA,h/o stroke, peripheral neuropathy, gout, HTN.    PT Comments  Pt tolerated increased activity level today, he ambulated 62' with RW with verbal cues for positioning in RW and for step length, vital signs stable, distance limited by fatigue. Pt puts forth good effort.     If plan is discharge home, recommend the following: A lot of help with bathing/dressing/bathroom;Assistance with cooking/housework;Assist for transportation;Help with stairs or ramp for entrance;A lot of help with walking and/or transfers   Can travel by private vehicle     No  Equipment Recommendations  None recommended by PT    Recommendations for Other Services       Precautions / Restrictions Precautions Precautions: Fall Restrictions Weight Bearing Restrictions Per Provider Order: No     Mobility  Bed Mobility Overal bed mobility: Needs Assistance Bed Mobility: Rolling, Sidelying to Sit Rolling: Min assist Sidelying to sit: Mod assist       General bed mobility comments: VCs for technique, mod A to raise trunk    Transfers Overall transfer level: Needs assistance Equipment used: Rolling walker (2 wheels) Transfers: Sit to/from Stand Sit to Stand: Min assist, From elevated surface           General transfer comment: VCs for hand placement, min A to power up     Ambulation/Gait Ambulation/Gait assistance: Contact guard assist Gait Distance (Feet): 40 Feet Assistive device: Rolling walker (2 wheels) Gait Pattern/deviations: Step-through pattern, Decreased stride length, Trunk flexed Gait velocity: decr     General Gait Details: VCs for positioning in RW and to increase step length, distance limited by fatigue, vital signs stable   Stairs             Wheelchair Mobility     Tilt Bed    Modified Rankin (Stroke Patients Only)       Balance Overall balance assessment: Needs assistance Sitting-balance support: Bilateral upper extremity supported, Feet supported Sitting balance-Leahy Scale: Fair     Standing balance support: Bilateral upper extremity supported, During functional activity, Reliant on assistive device for balance Standing balance-Leahy Scale: Poor                              Cognition Arousal: Alert Behavior During Therapy: WFL for tasks assessed/performed Overall Cognitive Status: Within Functional Limits for tasks assessed                                          Exercises General Exercises - Lower Extremity Ankle Circles/Pumps: AROM, Both, 15 reps, Supine    General Comments        Pertinent Vitals/Pain Pain Assessment Pain Assessment: No/denies pain Pain Score: 0-No pain    Home Living  Prior Function            PT Goals (current goals can now be found in the care plan section) Acute Rehab PT Goals Patient Stated Goal: Regain IND, play bridge PT Goal Formulation: Patient unable to participate in goal setting Time For Goal Achievement: 02/24/23 Potential to Achieve Goals: Fair Progress towards PT goals: Progressing toward goals    Frequency    Min 1X/week      PT Plan      Co-evaluation              AM-PAC PT 6 Clicks Mobility   Outcome Measure  Help needed turning from your back to your side while in  a flat bed without using bedrails?: A Little Help needed moving from lying on your back to sitting on the side of a flat bed without using bedrails?: A Lot Help needed moving to and from a bed to a chair (including a wheelchair)?: A Little Help needed standing up from a chair using your arms (e.g., wheelchair or bedside chair)?: A Lot Help needed to walk in hospital room?: A Little Help needed climbing 3-5 steps with a railing? : Total 6 Click Score: 14    End of Session Equipment Utilized During Treatment: Gait belt Activity Tolerance: Patient limited by fatigue Patient left: with call bell/phone within reach;in chair;with chair alarm set Nurse Communication: Mobility status PT Visit Diagnosis: Muscle weakness (generalized) (M62.81);History of falling (Z91.81);Difficulty in walking, not elsewhere classified (R26.2)     Time: 8998-8977 PT Time Calculation (min) (ACUTE ONLY): 21 min  Charges:    $Gait Training: 8-22 mins PT General Charges $$ ACUTE PT VISIT: 1 Visit                     Sylvan Delon Copp PT 02/14/2023  Acute Rehabilitation Services  Office [phone]

## 2023-02-14 NOTE — Progress Notes (Signed)
 PROGRESS NOTE    [name].  FMW:[id number] DOB: 03-Nov-1935 DOA: 02/02/2023 PCP: Katina Pfeiffer, PA-C  Chief Complaint  Patient presents with   Shortness of Breath    Brief Narrative:   Gerald Lewinski. is [name] 88 y.o. male who has an extensive PMH as below, significant for CAD, CVA, aortic valve replacement 2012, hypertension, pulmonary hypertension, atrial fibrillation and small bowel obstruction  . He presented to [facility] ED 1/25 with dyspnea that woke him from his sleep the night prior. He had also complained of LE edema over the preceding few weeks. On EMS arrival, he had sats of 85% on room air. He was placed on [place] O2 and given Solumedrol and DuoNebs in route to ED.   In ED, CXR was suggestive of mild edema. He was admitted by TRH and started on diuresis. He was seen by cardiology in consultation who agreed with diuresis for fluid overload and obtaining an echo. Echo was obtained and demonstrated EF 60-65%, mod LVH, RV pressure overload with RVSP 61, LA and RA severely dilated.   On evening of 1/26, he developed nausea and vomiting with probable aspiration event. There was concern for SBO and was seen by general surgery who recommended continuing NPO and continuing NGT to suction.   On 1/27, diuresis was held due to bump in renal function after IV lasix . He was only net negative since admit. He did not appear to have excess volume per cardiology notes and appeared improved from CHF standpoint. Dyspnea and hypoxia were felt to more likely be related to aspiration PNA.  General surgery had also seen and he had benign exam and was passing flatus. He unfortunately pulled out his NGT that day.   After pulling out NGT 1/27, he had episode of tachypnea and increased WOB along with spike in temp to 102.10F. Due to increased WOB, PCCM asked to see in consultation. He had been on 4L [place] prior to this episode.   Blood cultures from admission also returned positive for Enterobacter  cloacae. He was on Cefepime  escalated to Meropenem .  Family report concerns of pt aspirating for over Eadie Repetto week prior to admit. Further GOC clarified with pt's son, [name]/ HPOA, changed to DNR/ DNI.    Off vasopressors since 1/30. Improving renal function. Hgb down to 6.2, plts 23 overnight, transfused PRBC and plts. Currently no complaints, has multiple concerns about his nutrition/ diet.  Developed left upper extremity hematoma 20-25 cm.  Received blood products.  Sig Events 1/25 admit 1/27 pulled out NGT and had episode of respiratory distress along with spike in temp to 102.10F. PCCM consulted. 1/27 Blood cultures returned positive for Enterobacter cloacae. 1/27 worsening shock, started on NE with escalating doses, comfort meds placed 1/28 much improved, more awake and interactive, NE down to 9. CVP 6, coox 85. 1/28 discussion with pt and family regarding eating. Had failed SLP eval but pt adamant to eat. Discussed risks/benefits and agreed to let pt drink liquids with supervision, if does well then can move to regular diet 1/29 HFNC 4L, tolerating ensure, weaning NE.  Urology consulted for renal CT showing mild right hydronephrosis with 12mm right proximal ureteral stone, hemorrhage 1/30 cystoscopy with right ureteral stent placement 2/1 MRI with acute L frontal and L temporal white matter infarcts  Assessment & Plan:   Principal Problem:   Acute respiratory failure with hypoxia [facility]) Active Problems:   Essential hypertension   Permanent atrial fibrillation (HCC)   CAD (coronary artery  disease)   s/p mitral valve repair   Anemia due to chronic blood loss   Benign prostatic hyperplasia with lower urinary tract symptoms   Thrombocytopenia (HCC)   Gout   Hypokalemia   Left hemiparesis (HCC)   S/P AVR (aortic valve replacement)   History of ischemic right MCA stroke   Hyperglycemia   S/P tricuspid valve repair   S/P Maze operation for atrial fibrillation   Prediabetes    Protein-calorie malnutrition, moderate (HCC)   HLD (hyperlipidemia)   Other secondary pulmonary hypertension (HCC)   Elevated troponin   Volume overload   Aspiration pneumonia (HCC)   DNR (do not resuscitate)   Ureteral calculus, right   Nephrolithiasis   Sepsis due to Enterobacter [facility])   Renal cyst  Acute ischemic stroke: Thought to be related to FFP/platelet infusion MRI brain on 2/1 showed small acute left frontal and left temporal white matter infarct. MR angio showed severe stenosis of right P2 PCA Neurology follow up appreciated -> 02/10/2023 note Repeat head CT without evidence of hemorrhagic conversion  Seen by SLP- dysphagia 2 diet.   Acute hypoxic respiratory failure-multifactorial in the setting of aspiration pneumonia, fluid overload/CHF exacerbation: 02/09/2023: Off of oxygen. Continue aspiration precautions.   Continue pulmonary hygiene. Dysphagia diet.   Enterobacter Colace bacteremia in the setting of UTI  right ureteral stone with mild hydronephrosis: Septic shock-resolved 1/30 was not Safiatou Islam candidate for percutaneous  nephrostomy tube given elevated INR and thrombocytopenia.  S/p cystoscopy with right ureteral stent placement 1/30 Off of pressors now.  Fever improved. WBC improved. Cefepime  changed to Meropenem  due to encephalopathy. Continue antibiotics for total 14 days. He has PICC line placed 02/05/23. 1 of 2 cultures from 1/29 with staph epidermidis, suspect contaminant   Left iliopsoas intramuscular bleed Right renal cyst with hemorrhage  LUE Ecchymoses Discussed renal cyst hemorrhage with urology, ok with resuming anticoagulation Will restart cautiously on heparin , low threshold to hold   Ileus  NGT removed 1/27, passing flatus Rectal tube is no longer in place   Hypernatremia: Hypotonic fluids.  Encourage free water .     AKI: Baseline creatinine 0.9.  Worsened after IV diuresis and shock.  Mild right hydronephrosis with right renal calculi.  Status  post stent placement on 1/30 by urology.  Renal function improved.   Afib - on coumadin  Valvular disease s/p bioprosthetic AVR 2012, w/ MV and TV repair  CAD, non-obstructive  HFpEF: Remain euvolemic.  Continue to hold on beta-blocker and Coumadin .  Cardiology signed off on 02/06/2023. INR goal is 2-3.  INR subtherapeutic since 2/2.  heparin  gtt.  Consider warfarin resumption after 24 hrs on heparin .    Anemia of chronic disease Acute on chronic Thrombocytopenia Coagulapathy on coumadin   S/p 2 units platelets, 1 unit FFP, 4 units pRBC's  With repeat head CT this AM without evidence of hemorrhagic conversion, continue heparin  gtt cautiously, will consider restarting warfarin 2/7 Cardiology notably has recommended heparin  bridge for INR <2 Per oncology, anemia thought due to sepsis, bleeding, renal failure, and phlebotomy - Hb expected to improve over next several weeks    Moderate protein calorie malnutrition:  Dietitian on board Encourage oral diet, supplements.   Hyperglycemia- A1c 6.2 SSI     DVT prophylaxis: SCD Code Status: DNR Family Communication: noen Disposition:   Status is: Inpatient Remains inpatient appropriate because: need for continued inpatient care   Consultants:  Cardiology s/o 02/06/2023 Urology Neurology Oncology surgery  Procedures:   02/07/2023 Cystoscopy right ureteral stent placement right  retrograde pyelography with interpretation   Echo IMPRESSIONS     1. S/P MV repair with mean gradient 8 mmHg and no MR; s/p AVR with mean  gradient 14 mmHg and no AI; s/p TV repair with mild TR.   2. Left ventricular ejection fraction, by estimation, is 60 to 65%. The  left ventricle has normal function. The left ventricle has no regional  wall motion abnormalities. There is moderate left ventricular hypertrophy.  Left ventricular diastolic  parameters are indeterminate. There is the interventricular septum is  flattened in systole and diastole,  consistent with right ventricular  pressure and volume overload.   3. Right ventricular systolic function is normal. The right ventricular  size is mildly enlarged. There is severely elevated pulmonary artery  systolic pressure.   4. Left atrial size was severely dilated.   5. Right atrial size was severely dilated.   6. The mitral valve has been repaired/replaced. No evidence of mitral  valve regurgitation. No evidence of mitral stenosis. There is Cael Worth prosthetic  annuloplasty ring present in the mitral position.   7. The tricuspid valve is has been repaired/replaced. The tricuspid valve  is status post repair with an annuloplasty ring.   8. The aortic valve has been repaired/replaced. Aortic valve  regurgitation is not visualized. No aortic stenosis is present. There is Lene Mckay  bioprosthetic valve present in the aortic position.   9. The inferior vena cava is dilated in size with <50% respiratory  variability, suggesting right atrial pressure of 15 mmHg.   Carotid US  Summary:  Right Carotid: Velocities in the right ICA are consistent with Robinson Brinkley 1-39%  stenosis.   Left Carotid: Velocities in the left ICA are consistent with Tammra Pressman 1-39%  stenosis.   Vertebrals: Bilateral vertebral arteries demonstrate antegrade flow.  Subclavians: Normal flow hemodynamics were seen in bilateral subclavian               arteries.    Antimicrobials:  Anti-infectives (From admission, onward)    Start     Dose/Rate Route Frequency Ordered Stop   02/13/23 1130  meropenem  (MERREM ) 1 g in sodium chloride  0.9 % 100 mL IVPB        1 g 200 mL/hr over 30 Minutes Intravenous Every 8 hours 02/13/23 1043     02/09/23 2200  meropenem  (MERREM ) 1 g in sodium chloride  0.9 % 100 mL IVPB  Status:  Discontinued        1 g 200 mL/hr over 30 Minutes Intravenous Every 12 hours 02/09/23 1402 02/13/23 1043   02/08/23 1100  ceFEPIme  (MAXIPIME ) 2 g in sodium chloride  0.9 % 100 mL IVPB  Status:  Discontinued        2 g 200 mL/hr over  30 Minutes Intravenous Every 12 hours 02/08/23 1001 02/09/23 1401   02/06/23 1700  ceFEPIme  (MAXIPIME ) 2 g in sodium chloride  0.9 % 100 mL IVPB  Status:  Discontinued        2 g 200 mL/hr over 30 Minutes Intravenous Every 24 hours 02/06/23 0917 02/08/23 1001   02/05/23 1700  meropenem  (MERREM ) 500 mg in sodium chloride  0.9 % 100 mL IVPB  Status:  Discontinued        500 mg 200 mL/hr over 30 Minutes Intravenous Every 12 hours 02/05/23 0718 02/06/23 0908   02/04/23 1700  meropenem  (MERREM ) 1 g in sodium chloride  0.9 % 100 mL IVPB  Status:  Discontinued        1 g 200 mL/hr over 30 Minutes  Intravenous Every 12 hours 02/04/23 1457 02/05/23 0718   02/04/23 1200  Ampicillin -Sulbactam (UNASYN ) 3 g in sodium chloride  0.9 % 100 mL IVPB  Status:  Discontinued        3 g 200 mL/hr over 30 Minutes Intravenous Every 12 hours 02/04/23 0203 02/04/23 0944   02/04/23 1100  ceFEPIme  (MAXIPIME ) 2 g in sodium chloride  0.9 % 100 mL IVPB  Status:  Discontinued        2 g 200 mL/hr over 30 Minutes Intravenous Every 24 hours 02/04/23 0945 02/04/23 1456   02/03/23 1200  Ampicillin -Sulbactam (UNASYN ) 3 g in sodium chloride  0.9 % 100 mL IVPB  Status:  Discontinued        3 g 200 mL/hr over 30 Minutes Intravenous Every 6 hours 02/03/23 1115 02/04/23 0203       Subjective: No complaints  Objective: Vitals:   02/14/23 0820 02/14/23 0825 02/14/23 0830 02/14/23 1219  BP:    (!) 131/55  Pulse:    74  Resp:    17  Temp:    98.5 F (36.9 C)  TempSrc:    Oral  SpO2: 96% 96% 96% 99%  Weight:      Height:        Intake/Output Summary (Last 24 hours) at 02/14/2023 1532 Last data filed at 02/14/2023 1437 Gross per 24 hour  Intake 1824.96 ml  Output 1400 ml  Net 424.96 ml   Filed Weights   02/12/23 0701 02/13/23 0418 02/14/23 0322  Weight: 82.2 kg 74.6 kg 75.3 kg    Examination:  General: No acute distress. Cardiovascular: RRR Lungs: unlabored Neurological: Alert and oriented 3. Moves all extremities 4  with equal strength. Cranial nerves II through XII grossly intact. Extremities: No clubbing or cyanosis. No edema.    Data Reviewed: I have personally reviewed following labs and imaging studies  CBC: Recent Labs  Lab 02/07/23 2141 02/08/23 0620 02/09/23 0443 02/10/23 0453 02/11/23 0730 02/11/23 1851 02/12/23 0300 02/13/23 0015 02/14/23 0220  WBC 12.7*   < > 13.1* 15.2* 12.8*  --   --  11.6* 10.6*  NEUTROABS 9.6*  --   --   --   --   --   --   --  8.1*  HGB 6.2*   < > 7.6* 7.2* 5.9* 6.7* 7.3* 7.2* 7.2*  HCT 19.9*   < > 23.8* 22.6* 19.0* 21.5* 22.6* 23.4* 22.9*  MCV 95.2   < > 94.4 94.6 95.5  --   --  91.8 92.7  PLT 23*   < > 43* 57* 67*  --   --  84* 114*   < > = values in this interval not displayed.    Basic Metabolic Panel: Recent Labs  Lab 02/08/23 0620 02/09/23 0443 02/09/23 0702 02/10/23 0453 02/11/23 0523 02/13/23 0015 02/13/23 1633 02/14/23 0220  NA 145 158*   < > 157* 141 150* 152* 150*  K 3.6 4.4   < > 3.6 3.0* 3.1* 3.1* 3.3*  CL 113* 124*   < > 124* 112* 124* 125* 122*  CO2 23 26   < > 25 22 21* 22 22  GLUCOSE 484* 155*   < > 175* 526* 130* 249* 146*  BUN 80* 65*   < > 53* 41* 36* 30* 25*  CREATININE 1.83* 1.46*   < > 1.31* 1.37* 1.11 1.08 0.88  CALCIUM  7.7* 8.7*   < > 8.9 7.3* 8.0* 8.2* 8.1*  MG 2.3 2.2  --  2.2 2.0  --   --  2.3  PHOS 3.1 3.7  --  3.0 3.4  --   --  2.7   < > = values in this interval not displayed.    GFR: Estimated Creatinine Clearance: 65.4 mL/min (by C-G formula based on SCr of 0.88 mg/dL).  Liver Function Tests: Recent Labs  Lab 02/07/23 1717 02/08/23 0620 02/14/23 0220  AST  --  48* 33  ALT  --  33 30  ALKPHOS  --  106 99  BILITOT  --  1.2 1.3*  PROT  --  5.2* 6.0*  ALBUMIN  2.2* 2.1* 2.1*    CBG: Recent Labs  Lab 02/13/23 2152 02/14/23 0004 02/14/23 0316 02/14/23 0729 02/14/23 1216  GLUCAP 153* 143* 125* 115* 143*     Recent Results (from the past 240 hours)  Culture, blood (Routine X 2) w Reflex to ID  Panel     Status: None   Collection Time: 02/06/23  6:18 PM   Specimen: BLOOD LEFT ARM  Result Value Ref Range Status   Specimen Description   Final    BLOOD LEFT ARM Performed at [facility], [address], [place], [place] [address]    Special Requests   Final    BOTTLES DRAWN AEROBIC ONLY Blood Culture results may not be optimal due to an inadequate volume of blood received in culture bottles Performed at [facility], [address], [place], [place] [address]    Culture   Final    NO GROWTH 5 DAYS Performed at [facility], [address], [place], [place] [address]    Report Status 02/11/2023 FINAL  Final  Culture, blood (Routine X 2) w Reflex to ID Panel     Status: Abnormal   Collection Time: 02/06/23  6:18 PM   Specimen: BLOOD LEFT HAND  Result Value Ref Range Status   Specimen Description   Final    BLOOD LEFT HAND Performed at [facility], [address] [place], [place] [address]    Special Requests   Final    BOTTLES DRAWN AEROBIC ONLY Blood Culture adequate volume Performed at [facility], [address] [place], [place] [address]    Culture  Setup Time   Final    GRAM POSITIVE COCCI AEROBIC BOTTLE ONLY CRITICAL RESULT CALLED TO, READ BACK BY AND VERIFIED WITH: M LILLISTON,PHARMD@0623  02/09/23 MK    Culture (Niani Mourer)  Final    STAPHYLOCOCCUS EPIDERMIDIS THE SIGNIFICANCE OF ISOLATING THIS ORGANISM FROM Jesselee Poth SINGLE SET OF BLOOD CULTURES WHEN MULTIPLE SETS ARE DRAWN IS UNCERTAIN. PLEASE NOTIFY THE MICROBIOLOGY DEPARTMENT WITHIN ONE WEEK IF SPECIATION AND SENSITIVITIES ARE REQUIRED. Performed at [facility], [address] [place], [place] [address]    Report Status 02/10/2023 FINAL  Final  Blood Culture ID Panel (Reflexed)     Status: Abnormal   Collection Time: 02/06/23  6:18 PM  Result Value Ref Range Status   Enterococcus faecalis NOT DETECTED NOT DETECTED Final   Enterococcus Faecium NOT DETECTED  NOT DETECTED Final   Listeria monocytogenes NOT DETECTED NOT DETECTED Final   Staphylococcus species DETECTED (Doctor Sheahan) NOT DETECTED Final    Comment: CRITICAL RESULT CALLED TO, READ BACK BY AND VERIFIED WITH: M LILLISTON,PHARMD@0623  02/09/23 MK    Staphylococcus aureus (BCID) NOT DETECTED NOT DETECTED Final   Staphylococcus epidermidis DETECTED (Thornton Dohrmann) NOT DETECTED Final    Comment: Methicillin (oxacillin) resistant coagulase negative staphylococcus. Possible blood culture contaminant (unless isolated from more than one blood  culture draw or clinical case suggests pathogenicity). No antibiotic treatment is indicated for blood  culture contaminants. CRITICAL RESULT CALLED TO, READ BACK BY AND VERIFIED WITH: M LILLISTON,PHARMD@0623  02/09/23 MK    Staphylococcus lugdunensis NOT DETECTED NOT DETECTED Final   Streptococcus species NOT DETECTED NOT DETECTED Final   Streptococcus agalactiae NOT DETECTED NOT DETECTED Final   Streptococcus pneumoniae NOT DETECTED NOT DETECTED Final   Streptococcus pyogenes NOT DETECTED NOT DETECTED Final   Loris Seelye.calcoaceticus-baumannii NOT DETECTED NOT DETECTED Final   Bacteroides fragilis NOT DETECTED NOT DETECTED Final   Enterobacterales NOT DETECTED NOT DETECTED Final   Enterobacter cloacae complex NOT DETECTED NOT DETECTED Final   Escherichia coli NOT DETECTED NOT DETECTED Final   Klebsiella aerogenes NOT DETECTED NOT DETECTED Final   Klebsiella oxytoca NOT DETECTED NOT DETECTED Final   Klebsiella pneumoniae NOT DETECTED NOT DETECTED Final   Proteus species NOT DETECTED NOT DETECTED Final   Salmonella species NOT DETECTED NOT DETECTED Final   Serratia marcescens NOT DETECTED NOT DETECTED Final   Haemophilus influenzae NOT DETECTED NOT DETECTED Final   Neisseria meningitidis NOT DETECTED NOT DETECTED Final   Pseudomonas aeruginosa NOT DETECTED NOT DETECTED Final   Stenotrophomonas maltophilia NOT DETECTED NOT DETECTED Final   Candida albicans NOT DETECTED NOT DETECTED  Final   Candida auris NOT DETECTED NOT DETECTED Final   Candida glabrata NOT DETECTED NOT DETECTED Final   Candida krusei NOT DETECTED NOT DETECTED Final   Candida parapsilosis NOT DETECTED NOT DETECTED Final   Candida tropicalis NOT DETECTED NOT DETECTED Final   Cryptococcus neoformans/gattii NOT DETECTED NOT DETECTED Final   Methicillin resistance mecA/C DETECTED (Gerik Coberly) NOT DETECTED Final    Comment: CRITICAL RESULT CALLED TO, READ BACK BY AND VERIFIED WITH: M LILLISTON,PHARMD@0623  02/09/23 MK Performed at [facility], [address] [place], [place] [address]   Urine Culture     Status: None   Collection Time: 02/07/23  8:42 AM   Specimen: Urine, Cystoscope  Result Value Ref Range Status   Specimen Description   Final    URINE, CLEAN CATCH Performed at [facility], [address], [place], [place] [address]    Special Requests   Final    URINE CYSTOSCOPY Performed at [facility], [address], [place], [place] [address]    Culture   Final    NO GROWTH Performed at [facility], [address], [place], [place] [address]    Report Status 02/08/2023 FINAL  Final         Radiology Studies: CT HEAD WO CONTRAST ( ) Result Date: 02/13/2023 CLINICAL DATA:  Provided history: Stroke, follow-up. EXAM: CT HEAD WITHOUT CONTRAST TECHNIQUE: Contiguous axial images were obtained from the base of the skull through the vertex without intravenous contrast. RADIATION DOSE REDUCTION: This exam was performed according to the departmental dose-optimization program which includes automated exposure control, adjustment of the mA and/or kV according to patient size and/or use of iterative reconstruction technique. COMPARISON:  Brain MRI 02/09/2023.  Noncontrast head CT 02/09/2023. FINDINGS: Brain: Generalized cerebral atrophy. Known small acute white matter infarct within the posterior left frontal lobe. Nikkia Devoss known small acute white matter infarct  within the posterior left temporal lobe is occult by CT and was better appreciated on the recent prior brain MRI of 02/09/2023. Chronic right MCA territory and right cerebellar infarcts again noted. Mild patchy and ill-defined hypoattenuation elsewhere within the cerebral white matter, nonspecific but compatible chronic small vessel disease. There is  no acute intracranial hemorrhage. No evidence of an intracranial mass. No midline shift. Vascular: No hyperdense vessel.  Atherosclerotic calcifications. Skull: No calvarial fracture or aggressive osseous lesion. Sinuses/Orbits: No mass or acute finding within the imaged orbits. No significant paranasal sinus disease at the imaged levels. Other: Right mastoid effusion. IMPRESSION: 1. Known small acute white matter infarcts within the left frontal and left temporal lobes, not appreciably changed in extent from the recent prior brain MRI of 02/09/2023. No evidence of hemorrhagic conversion. 2. Parenchymal atrophy, chronic small vessel ischemic disease and chronic infarcts, as described. 3. Right mastoid effusion. Electronically Signed   By: Rockey Childs D.O.   On: 02/13/2023 12:37        Scheduled Meds:  sodium chloride    Intravenous Once   allopurinol   50 mg Oral q morning   arformoterol   15 mcg Nebulization BID   budesonide  (PULMICORT ) nebulizer solution  0.5 mg Nebulization BID   Chlorhexidine  Gluconate Cloth  6 each Topical Daily   docusate  100 mg Oral Daily   feeding supplement  1 Bottle Oral TID BM   insulin  aspart  0-9 Units Subcutaneous Q4H   ipratropium-albuterol   3 mL Nebulization BID   multivitamin with minerals  1 tablet Oral Daily   mouth rinse  15 mL Mouth Rinse 4 times per day   pantoprazole  (PROTONIX ) IV  40 mg Intravenous Q12H   sodium chloride  flush  10-40 mL Intracatheter Q12H   sodium chloride  flush  3-10 mL Intravenous Q12H   Continuous Infusions:  sodium chloride  Stopped (02/07/23 1036)   dextrose  125 mL/hr at 02/14/23 1418    heparin  1,050 Units/hr (02/14/23 0452)   meropenem  (MERREM ) IV 1 g (02/14/23 1422)     LOS: 11 days    Time spent: over 30 min    Meliton Monte, MD [facility]   To contact the attending provider between 7A-7P or the covering provider during after hours 7P-7A, please log into the web site [email or web address] and access using universal [place] password for that web site. If you do not have the password, please call the hospital operator.  02/14/2023, 3:32 PM

## 2023-02-14 NOTE — Progress Notes (Signed)
 PHARMACY - ANTICOAGULATION CONSULT NOTE  Pharmacy Consult for Heparin  Indication: atrial fibrillation and stroke  Allergies  Allergen Reactions   Sulfa Antibiotics Other (See Comments)    Granulocytosis   Sulfamethoxazole-Trimethoprim     Other Reaction(s): Unknown   Zestril  [Lisinopril ] Cough    Patient Measurements: Height: 6' (182.9 cm) Weight: 75.3 kg (166 lb 0.1 oz) IBW/kg (Calculated) : 77.6 Heparin  Dosing Weight: TBW  Vital Signs: Temp: 98.5 F (36.9 C) (02/06 0320) Temp Source: Oral (02/06 0320) BP: 141/64 (02/06 0320) Pulse Rate: 66 (02/06 0320)  Labs: Recent Labs    02/11/23 0523 02/11/23 0730 02/11/23 1851 02/12/23 0300 02/12/23 1525 02/13/23 0015 02/13/23 1633 02/14/23 0220  HGB  --  5.9*   < > 7.3*  --  7.2*  --  7.2*  HCT  --  19.0*   < > 22.6*  --  23.4*  --  22.9*  PLT  --  67*  --   --   --  84*  --  114*  LABPROT 21.4*  --   --   --  17.9*  --   --  17.8*  INR 1.8*  --   --   --  1.5*  --   --  1.4*  HEPARINUNFRC  --   --   --   --   --   --   --  <0.10*  CREATININE 1.37*  --   --   --   --  1.11 1.08 0.88   < > = values in this interval not displayed.    Estimated Creatinine Clearance: 65.4 mL/min (by C-G formula based on SCr of 0.88 mg/dL).   Medical History: Past Medical History:  Diagnosis Date   Acquired dilation of ascending aorta and aortic root (HCC)    43mm aortic root and 41mm ascending aorta on echo 03/2020   Arthritis    Bladder stones    Borderline diabetes    BPH (benign prostatic hyperplasia)    CAD (coronary artery disease) 12/19/2020   Non-obstructive coronary artery disease at cath in 2019   Complication of anesthesia    problem with voiding after anesthesia,   Coronary artery disease    cardiologist-  dr lovette mar gerhart NP--- per cath 06-02-2010 non-obstructive cad pLAD 30-40%   CVA (cerebral vascular accident) (HCC) 10/24/2016   Diverticulosis of colon    Dysrhythmia    afib   Gout    Heart murmur     History of adenomatous polyp of colon    2002-- tubular adenoma   History of aortic insufficiency    severe -- s/p  AVR 08-03-2010   History of small bowel obstruction    02/ 2007 mechanical sbo s/p  surgical intervention;  partial sbo 09/ 2011 and 03-20-2011 resolved without surgical intervention   History of urinary retention    HTN (hypertension)    Other secondary pulmonary hypertension (HCC) 03/20/2022   TTE 03/19/22: EF 65-70, no RWMA, moderate LVH, normal RVSF, severe pulmonary hypertension (RVSP 60.4), severe BAE, normal structure and function of mitral valve repair (mean gradient 9), mild TR, trivial AI, normal structure and function of AVR (mean 15.4), moderate PI, aortic root and ascending aorta 40 mm, RAP 15   Peripheral neuropathy    Persistent atrial fibrillation (HCC)    Pre-diabetes    S/P aortic valve replacement with prosthetic valve 08/03/2010   tissue valve   S/P Maze operation for atrial fibrillation 01/30/2018   Complete bilateral atrial lesion  set using bipolar radiofrequency and cryothermy with clipping of LA appendage   S/P MVR (mitral valve repair) 01/30/2018   Complex valvuloplasty including artificial Gore-tex neochord placement x4 and Carbo medics Annuloflex ring annuloplasty, size 28   S/P patent foramen ovale closure 08/03/2010   at same time AVR   S/P tricuspid valve repair 01/30/2018   Using an MC3 Annuloplasty ring, size 28   Stroke (HCC)    Thrombocytopenia (HCC)    Tricuspid regurgitation     Medications:  Scheduled:   sodium chloride    Intravenous Once   allopurinol   50 mg Oral q morning   arformoterol   15 mcg Nebulization BID   budesonide  (PULMICORT ) nebulizer solution  0.5 mg Nebulization BID   Chlorhexidine  Gluconate Cloth  6 each Topical Daily   docusate  100 mg Oral Daily   feeding supplement  1 Bottle Oral TID BM   insulin  aspart  0-9 Units Subcutaneous Q4H   ipratropium-albuterol   3 mL Nebulization BID   multivitamin with minerals  1  tablet Oral Daily   mouth rinse  15 mL Mouth Rinse 4 times per day   pantoprazole  (PROTONIX ) IV  40 mg Intravenous Q12H   sodium chloride  flush  10-40 mL Intracatheter Q12H   sodium chloride  flush  3-10 mL Intravenous Q12H   Infusions:   sodium chloride  Stopped (02/07/23 1036)   dextrose  100 mL/hr at 02/13/23 2027   heparin  900 Units/hr (02/13/23 2029)   meropenem  (MERREM ) IV 1 g (02/13/23 2036)    Assessment: 87 yoM admitted on 1/25 with dyspnea and then developed SBO (1/26) with aspiration, E.cloacae bacteremia, shock, Left iliopsoas intramuscular bleed, LUE hematoma, ureteral stone with hemorrhage (1/29), cystoscopy with stent placement (1/30), MRI with acute infarcts (2/1).    PMH is significant for CAD, CVA, aortic valve replacement 2012, atrial fibrillation on warfarin, but was held for NPO status.  Last warfarin 5mg  given on 1/25.  Pharmacy is consulted to resume anticoagulation with Heparin  IV.    SCr down to 1.08 No INR today, but yesterday INR decreased to 1.5 CBC:  Hgb low/stable at 7.2, Plt up to 84k - Transfused PRBC on 1/29, PRBC/FFP/Plt on 1/31, and PRBC on 2/3  02/14/2023 HL <0.1 subtherapeutic on 900 units/hr Hgb 7.2, plts 114 Per RN no bleeding   Goal of Therapy:  Heparin  level 0.3-0.5 units/ml Monitor platelets by anticoagulation protocol: Yes   Plan:  No heparin  bolus d/t recent stroke/bleeding Increase heparin  drip to 1050 units/hr Heparin  level in 8 hours Daily heparin  level and CBC

## 2023-02-14 NOTE — Progress Notes (Signed)
 PHARMACY - ANTICOAGULATION CONSULT NOTE  Pharmacy Consult for warfarin --> heparin   Indication: afib, CVA, s/p mitral valve repair   Allergies  Allergen Reactions   Sulfa Antibiotics Other (See Comments)    Granulocytosis   Sulfamethoxazole-Trimethoprim     Other Reaction(s): Unknown   Zestril  [Lisinopril ] Cough    Patient Measurements: Height: 6' (182.9 cm) Weight: 75.3 kg (166 lb 0.1 oz) IBW/kg (Calculated) : 77.6 Heparin  Dosing Weight: 75 kg  Vital Signs: Temp: 98.5 F (36.9 C) (02/06 1219) Temp Source: Oral (02/06 1219) BP: 131/55 (02/06 1219) Pulse Rate: 74 (02/06 1219)  Labs: Recent Labs    02/12/23 0300 02/12/23 1525 02/13/23 0015 02/13/23 1633 02/14/23 0220  HGB 7.3*  --  7.2*  --  7.2*  HCT 22.6*  --  23.4*  --  22.9*  PLT  --   --  84*  --  114*  LABPROT  --  17.9*  --   --  17.8*  INR  --  1.5*  --   --  1.4*  HEPARINUNFRC  --   --   --   --  <0.10*  CREATININE  --   --  1.11 1.08 0.88    Estimated Creatinine Clearance: 65.4 mL/min (by C-G formula based on SCr of 0.88 mg/dL).   Medical History: Past Medical History:  Diagnosis Date   Acquired dilation of ascending aorta and aortic root (HCC)    43mm aortic root and 41mm ascending aorta on echo 03/2020   Arthritis    Bladder stones    Borderline diabetes    BPH (benign prostatic hyperplasia)    CAD (coronary artery disease) 12/19/2020   Non-obstructive coronary artery disease at cath in 2019   Complication of anesthesia    problem with voiding after anesthesia,   Coronary artery disease    cardiologist-  dr lovette mar gerhart NP--- per cath 06-02-2010 non-obstructive cad pLAD 30-40%   CVA (cerebral vascular accident) (HCC) 10/24/2016   Diverticulosis of colon    Dysrhythmia    afib   Gout    Heart murmur    History of adenomatous polyp of colon    2002-- tubular adenoma   History of aortic insufficiency    severe -- s/p  AVR 08-03-2010   History of small bowel obstruction    02/  2007 mechanical sbo s/p  surgical intervention;  partial sbo 09/ 2011 and 03-20-2011 resolved without surgical intervention   History of urinary retention    HTN (hypertension)    Other secondary pulmonary hypertension (HCC) 03/20/2022   TTE 03/19/22: EF 65-70, no RWMA, moderate LVH, normal RVSF, severe pulmonary hypertension (RVSP 60.4), severe BAE, normal structure and function of mitral valve repair (mean gradient 9), mild TR, trivial AI, normal structure and function of AVR (mean 15.4), moderate PI, aortic root and ascending aorta 40 mm, RAP 15   Peripheral neuropathy    Persistent atrial fibrillation (HCC)    Pre-diabetes    S/P aortic valve replacement with prosthetic valve 08/03/2010   tissue valve   S/P Maze operation for atrial fibrillation 01/30/2018   Complete bilateral atrial lesion set using bipolar radiofrequency and cryothermy with clipping of LA appendage   S/P MVR (mitral valve repair) 01/30/2018   Complex valvuloplasty including artificial Gore-tex neochord placement x4 and Carbo medics Annuloflex ring annuloplasty, size 28   S/P patent foramen ovale closure 08/03/2010   at same time AVR   S/P tricuspid valve repair 01/30/2018   Using an MC3  Annuloplasty ring, size 28   Stroke (HCC)    Thrombocytopenia (HCC)    Tricuspid regurgitation     Medications:  - PTA warfarin regimen: 5 mg daily except for 7.5 mg on Mondays   Assessment: [name] with hx afib, CVA and mitral valve repair on warfarin PTA who presented to the ED on 02/02/23 with SOB. Warfarin resumed on admission and changed to heparin  drip on 02/03/23 d/t n/v and SBO. During this admission, he was noted to have hemorrhage of a right kidney cyst and small intramuscular bleed within the left iliopsoas per renal CT on 02/06/23, cystoscopy with stent placement on 02/07/23, a new acute stroke on 02/09/23 and is currently being treated for enterobacter bacteremia.  Today, 02/14/2023: - heparin  level collected at  2:30p is undetectable. Heparin  drip is infusing in PICC. IV team stopped heparin  drip for 4 min and drew heparin  level from PICC site.  - cbc stable - no new bleeding documented   Goal of Therapy:  Heparin  level 0.3-0.5 units/ml Monitor platelets by anticoagulation protocol: Yes   Plan:  - Increase heparin  drip to 1250 units/hr - check 8 hr heparin  level  - monitor for s/sx bleeding   Kaizley Aja P 02/14/2023,2:17 PM

## 2023-02-15 DIAGNOSIS — J9601 Acute respiratory failure with hypoxia: Secondary | ICD-10-CM | POA: Diagnosis not present

## 2023-02-15 LAB — BASIC METABOLIC PANEL
Anion gap: 7 (ref 5–15)
Anion gap: 7 (ref 5–15)
Anion gap: 8 (ref 5–15)
BUN: 18 mg/dL (ref 8–23)
BUN: 20 mg/dL (ref 8–23)
BUN: 21 mg/dL (ref 8–23)
CO2: 20 mmol/L — ABNORMAL LOW (ref 22–32)
CO2: 21 mmol/L — ABNORMAL LOW (ref 22–32)
CO2: 22 mmol/L (ref 22–32)
Calcium: 8 mg/dL — ABNORMAL LOW (ref 8.9–10.3)
Calcium: 8.1 mg/dL — ABNORMAL LOW (ref 8.9–10.3)
Calcium: 8.3 mg/dL — ABNORMAL LOW (ref 8.9–10.3)
Chloride: 119 mmol/L — ABNORMAL HIGH (ref 98–111)
Chloride: 121 mmol/L — ABNORMAL HIGH (ref 98–111)
Chloride: 122 mmol/L — ABNORMAL HIGH (ref 98–111)
Creatinine, Ser: 0.9 mg/dL (ref 0.61–1.24)
Creatinine, Ser: 0.95 mg/dL (ref 0.61–1.24)
Creatinine, Ser: 0.99 mg/dL (ref 0.61–1.24)
GFR, Estimated: >60 mL/min (ref >60)
GFR, Estimated: >60 mL/min (ref >60)
GFR, Estimated: >60 mL/min (ref >60)
Glucose, Bld: 108 mg/dL — ABNORMAL HIGH (ref 70–99)
Glucose, Bld: 119 mg/dL — ABNORMAL HIGH (ref 70–99)
Glucose, Bld: 128 mg/dL — ABNORMAL HIGH (ref 70–99)
Potassium: 3 mmol/L — ABNORMAL LOW (ref 3.5–5.1)
Potassium: 3.1 mmol/L — ABNORMAL LOW (ref 3.5–5.1)
Potassium: 3.8 mmol/L (ref 3.5–5.1)
Sodium: 148 mmol/L — ABNORMAL HIGH (ref 135–145)
Sodium: 149 mmol/L — ABNORMAL HIGH (ref 135–145)
Sodium: 150 mmol/L — ABNORMAL HIGH (ref 135–145)

## 2023-02-15 LAB — GLUCOSE, CAPILLARY
Glucose-Capillary: 113 mg/dL — ABNORMAL HIGH (ref 70–99)
Glucose-Capillary: 114 mg/dL — ABNORMAL HIGH (ref 70–99)
Glucose-Capillary: 120 mg/dL — ABNORMAL HIGH (ref 70–99)
Glucose-Capillary: 91 mg/dL (ref 70–99)
Glucose-Capillary: 92 mg/dL (ref 70–99)
Glucose-Capillary: 97 mg/dL (ref 70–99)

## 2023-02-15 LAB — CBC
HCT: 22.8 % — ABNORMAL LOW (ref 39.0–52.0)
Hemoglobin: 6.9 g/dL — CL (ref 13.0–17.0)
MCH: 28.3 pg (ref 26.0–34.0)
MCHC: 30.3 g/dL (ref 30.0–36.0)
MCV: 93.4 fL (ref 80.0–100.0)
Platelets: 146 10*3/uL — ABNORMAL LOW (ref 150–400)
RBC: 2.44 MIL/uL — ABNORMAL LOW (ref 4.22–5.81)
RDW: 18.3 % — ABNORMAL HIGH (ref 11.5–15.5)
WBC: 9 10*3/uL (ref 4.0–10.5)
nRBC: 0 % (ref 0.0–0.2)

## 2023-02-15 LAB — PROTIME-INR
INR: 1.4 — ABNORMAL HIGH (ref 0.8–1.2)
Prothrombin Time: 17.2 s — ABNORMAL HIGH (ref 11.4–15.2)

## 2023-02-15 LAB — PREPARE RBC (CROSSMATCH)

## 2023-02-15 LAB — HEPARIN LEVEL (UNFRACTIONATED)
Heparin Unfractionated: 0.12 [IU]/mL — ABNORMAL LOW (ref 0.30–0.70)
Heparin Unfractionated: 0.3 [IU]/mL (ref 0.30–0.70)
Heparin Unfractionated: <0.1 [IU]/mL — ABNORMAL LOW (ref 0.30–0.70)

## 2023-02-15 MED ORDER — WARFARIN SODIUM 5 MG PO TABS
5.0000 mg | ORAL_TABLET | Freq: Once | ORAL | Status: AC
  Administered 2023-02-15: 5 mg via ORAL
  Filled 2023-02-15: qty 1

## 2023-02-15 MED ORDER — POTASSIUM CHLORIDE CRYS ER 20 MEQ PO TBCR: 40.0000 meq | EXTENDED_RELEASE_TABLET | ORAL | Status: DC

## 2023-02-15 MED ORDER — WARFARIN - PHARMACIST DOSING INPATIENT: Freq: Every day | Status: DC

## 2023-02-15 MED ORDER — SODIUM CHLORIDE 0.9% IV SOLUTION: Freq: Once | INTRAVENOUS | Status: AC

## 2023-02-15 MED ORDER — POTASSIUM CHLORIDE 20 MEQ PO PACK
40.0000 meq | PACK | ORAL | Status: AC
  Administered 2023-02-15 (×2): 40 meq via ORAL
  Filled 2023-02-15 (×2): qty 2

## 2023-02-15 NOTE — Progress Notes (Signed)
 PROGRESS NOTE    [name].  FMW:[id number] DOB: Mar 18, 1935 DOA: 02/02/2023 PCP: Katina Pfeiffer, PA-C  Chief Complaint  Patient presents with   Shortness of Breath    Brief Narrative:   Gerald Gasparyan. is [name] 88 y.o. male who has an extensive PMH as below, significant for CAD, CVA, aortic valve replacement 2012, hypertension, pulmonary hypertension, atrial fibrillation and small bowel obstruction  . He presented to [facility] ED 1/25 with dyspnea that woke him from his sleep the night prior. He had also complained of LE edema over the preceding few weeks. On EMS arrival, he had sats of 85% on room air. He was placed on [place] O2 and given Solumedrol and DuoNebs in route to ED.   In ED, CXR was suggestive of mild edema. He was admitted by TRH and started on diuresis. He was seen by cardiology in consultation who agreed with diuresis for fluid overload and obtaining an echo. Echo was obtained and demonstrated EF 60-65%, mod LVH, RV pressure overload with RVSP 61, LA and RA severely dilated.   On evening of 1/26, he developed nausea and vomiting with probable aspiration event. There was concern for SBO and was seen by general surgery who recommended continuing NPO and continuing NGT to suction.   On 1/27, diuresis was held due to bump in renal function after IV lasix . He was only net negative since admit. He did not appear to have excess volume per cardiology notes and appeared improved from CHF standpoint. Dyspnea and hypoxia were felt to more likely be related to aspiration PNA.  General surgery had also seen and he had benign exam and was passing flatus. He unfortunately pulled out his NGT that day.   After pulling out NGT 1/27, he had episode of tachypnea and increased WOB along with spike in temp to 102.57F. Due to increased WOB, PCCM asked to see in consultation. He had been on 4L [place] prior to this episode.   Blood cultures from admission also returned positive for Enterobacter  cloacae. He was on Cefepime  escalated to Meropenem .  Family report concerns of pt aspirating for over Chamaine Stankus week prior to admit. Further GOC clarified with pt's son, [name]/ HPOA, changed to DNR/ DNI.    Off vasopressors since 1/30. Improving renal function. Hgb down to 6.2, plts 23 overnight, transfused PRBC and plts. Currently no complaints, has multiple concerns about his nutrition/ diet.  Developed left upper extremity hematoma 20-25 cm.  Received blood products.  Sig Events 1/25 admit 1/27 pulled out NGT and had episode of respiratory distress along with spike in temp to 102.57F. PCCM consulted. 1/27 Blood cultures returned positive for Enterobacter cloacae. 1/27 worsening shock, started on NE with escalating doses, comfort meds placed 1/28 much improved, more awake and interactive, NE down to 9. CVP 6, coox 85. 1/28 discussion with pt and family regarding eating. Had failed SLP eval but pt adamant to eat. Discussed risks/benefits and agreed to let pt drink liquids with supervision, if does well then can move to regular diet 1/29 HFNC 4L, tolerating ensure, weaning NE.  Urology consulted for renal CT showing mild right hydronephrosis with 12mm right proximal ureteral stone, hemorrhage 1/30 cystoscopy with right ureteral stent placement 2/1 MRI with acute L frontal and L temporal white matter infarcts  Assessment & Plan:   Principal Problem:   Acute respiratory failure with hypoxia [facility]) Active Problems:   Essential hypertension   Permanent atrial fibrillation (HCC)   CAD (coronary artery  disease)   s/p mitral valve repair   Anemia due to chronic blood loss   Benign prostatic hyperplasia with lower urinary tract symptoms   Thrombocytopenia (HCC)   Gout   Hypokalemia   Left hemiparesis (HCC)   S/P AVR (aortic valve replacement)   History of ischemic right MCA stroke   Hyperglycemia   S/P tricuspid valve repair   S/P Maze operation for atrial fibrillation   Prediabetes    Protein-calorie malnutrition, moderate (HCC)   HLD (hyperlipidemia)   Other secondary pulmonary hypertension (HCC)   Elevated troponin   Volume overload   Aspiration pneumonia (HCC)   DNR (do not resuscitate)   Ureteral calculus, right   Nephrolithiasis   Sepsis due to Enterobacter [facility])   Renal cyst  Goals of Care Family member called with questions regarding his code status.  I discussed code status with patient and he requested full code.  Son notes this is inconsistent with living will.  Will rediscuss with patient.   Acute ischemic stroke: Thought to be related to FFP/platelet infusion MRI brain on 2/1 showed small acute left frontal and left temporal white matter infarct. MR angio showed severe stenosis of right P2 PCA Neurology follow up appreciated -> 02/10/2023 note Repeat head CT without evidence of hemorrhagic conversion  Seen by SLP- dysphagia 2 diet.   Acute hypoxic respiratory failure-multifactorial in the setting of aspiration pneumonia, fluid overload/CHF exacerbation: 02/09/2023: Off of oxygen. Continue aspiration precautions.   Continue pulmonary hygiene. Dysphagia diet.   Enterobacter Colace bacteremia in the setting of UTI  right ureteral stone with mild hydronephrosis: Septic shock-resolved 1/30 was not Nacole Fluhr candidate for percutaneous  nephrostomy tube given elevated INR and thrombocytopenia.  S/p cystoscopy with right ureteral stent placement 1/30 Off of pressors now.  Fever improved. WBC improved. Cefepime  changed to Meropenem  due to encephalopathy. Continue antibiotics for total 14 days. He has PICC line placed 02/05/23. 1 of 2 cultures from 1/29 with staph epidermidis, suspect contaminant   Left iliopsoas intramuscular bleed Right renal cyst with hemorrhage  LUE Ecchymoses Discussed renal cyst hemorrhage with urology, ok with resuming anticoagulation Will restart cautiously on heparin , low threshold to hold   Ileus  NGT removed 1/27, passing  flatus Rectal tube is no longer in place   Hypernatremia: Hypotonic fluids.  Encourage free water .     AKI: Baseline creatinine 0.9.  Worsened after IV diuresis and shock.  Mild right hydronephrosis with right renal calculi.  Status post stent placement on 1/30 by urology.  Renal function improved.   Afib - on coumadin  Valvular disease s/p bioprosthetic AVR 2012, w/ MV and TV repair  CAD, non-obstructive  HFpEF: Remain euvolemic.  Continue to hold on beta-blocker and Coumadin .  Cardiology signed off on 02/06/2023. INR goal is 2-3.  INR subtherapeutic since 2/2.  heparin  gtt.  Will restart warfarin.   Anemia of chronic disease Acute on chronic Thrombocytopenia Coagulapathy on coumadin   S/p 2 units platelets, 1 unit FFP, 4 units pRBC's  Transfuse additional unit pRBC today With repeat head CT this AM without evidence of hemorrhagic conversion, continue heparin  gtt cautiously, will restart warfarin 2/7 Cardiology notably has recommended heparin  bridge for INR <2 Per oncology, anemia thought due to sepsis, bleeding, renal failure, and phlebotomy - Hb expected to improve over next several weeks    Moderate protein calorie malnutrition:  Dietitian on board Encourage oral diet, supplements.   Hyperglycemia- A1c 6.2 SSI     DVT prophylaxis: SCD Code Status: DNR Family Communication:  noen Disposition:   Status is: Inpatient Remains inpatient appropriate because: need for continued inpatient care   Consultants:  Cardiology s/o 02/06/2023 Urology Neurology Oncology surgery  Procedures:   02/07/2023 Cystoscopy right ureteral stent placement right retrograde pyelography with interpretation   Echo IMPRESSIONS     1. S/P MV repair with mean gradient 8 mmHg and no MR; s/p AVR with mean  gradient 14 mmHg and no AI; s/p TV repair with mild TR.   2. Left ventricular ejection fraction, by estimation, is 60 to 65%. The  left ventricle has normal function. The left ventricle  has no regional  wall motion abnormalities. There is moderate left ventricular hypertrophy.  Left ventricular diastolic  parameters are indeterminate. There is the interventricular septum is  flattened in systole and diastole, consistent with right ventricular  pressure and volume overload.   3. Right ventricular systolic function is normal. The right ventricular  size is mildly enlarged. There is severely elevated pulmonary artery  systolic pressure.   4. Left atrial size was severely dilated.   5. Right atrial size was severely dilated.   6. The mitral valve has been repaired/replaced. No evidence of mitral  valve regurgitation. No evidence of mitral stenosis. There is Arieliz Latino prosthetic  annuloplasty ring present in the mitral position.   7. The tricuspid valve is has been repaired/replaced. The tricuspid valve  is status post repair with an annuloplasty ring.   8. The aortic valve has been repaired/replaced. Aortic valve  regurgitation is not visualized. No aortic stenosis is present. There is Melita Villalona  bioprosthetic valve present in the aortic position.   9. The inferior vena cava is dilated in size with <50% respiratory  variability, suggesting right atrial pressure of 15 mmHg.   Carotid US  Summary:  Right Carotid: Velocities in the right ICA are consistent with Evaristo Tsuda 1-39%  stenosis.   Left Carotid: Velocities in the left ICA are consistent with Shakara Tweedy 1-39%  stenosis.   Vertebrals: Bilateral vertebral arteries demonstrate antegrade flow.  Subclavians: Normal flow hemodynamics were seen in bilateral subclavian               arteries.    Antimicrobials:  Anti-infectives (From admission, onward)    Start     Dose/Rate Route Frequency Ordered Stop   02/13/23 1130  meropenem  (MERREM ) 1 g in sodium chloride  0.9 % 100 mL IVPB        1 g 200 mL/hr over 30 Minutes Intravenous Every 8 hours 02/13/23 1043     02/09/23 2200  meropenem  (MERREM ) 1 g in sodium chloride  0.9 % 100 mL IVPB  Status:   Discontinued        1 g 200 mL/hr over 30 Minutes Intravenous Every 12 hours 02/09/23 1402 02/13/23 1043   02/08/23 1100  ceFEPIme  (MAXIPIME ) 2 g in sodium chloride  0.9 % 100 mL IVPB  Status:  Discontinued        2 g 200 mL/hr over 30 Minutes Intravenous Every 12 hours 02/08/23 1001 02/09/23 1401   02/06/23 1700  ceFEPIme  (MAXIPIME ) 2 g in sodium chloride  0.9 % 100 mL IVPB  Status:  Discontinued        2 g 200 mL/hr over 30 Minutes Intravenous Every 24 hours 02/06/23 0917 02/08/23 1001   02/05/23 1700  meropenem  (MERREM ) 500 mg in sodium chloride  0.9 % 100 mL IVPB  Status:  Discontinued        500 mg 200 mL/hr over 30 Minutes Intravenous Every 12 hours 02/05/23  9281 02/06/23 0908   02/04/23 1700  meropenem  (MERREM ) 1 g in sodium chloride  0.9 % 100 mL IVPB  Status:  Discontinued        1 g 200 mL/hr over 30 Minutes Intravenous Every 12 hours 02/04/23 1457 02/05/23 0718   02/04/23 1200  Ampicillin -Sulbactam (UNASYN ) 3 g in sodium chloride  0.9 % 100 mL IVPB  Status:  Discontinued        3 g 200 mL/hr over 30 Minutes Intravenous Every 12 hours 02/04/23 0203 02/04/23 0944   02/04/23 1100  ceFEPIme  (MAXIPIME ) 2 g in sodium chloride  0.9 % 100 mL IVPB  Status:  Discontinued        2 g 200 mL/hr over 30 Minutes Intravenous Every 24 hours 02/04/23 0945 02/04/23 1456   02/03/23 1200  Ampicillin -Sulbactam (UNASYN ) 3 g in sodium chloride  0.9 % 100 mL IVPB  Status:  Discontinued        3 g 200 mL/hr over 30 Minutes Intravenous Every 6 hours 02/03/23 1115 02/04/23 0203       Subjective: No complaints  Objective: Vitals:   02/15/23 0358 02/15/23 0417 02/15/23 0754 02/15/23 1019  BP: (!) 143/63   131/60  Pulse: 70   69  Resp: 16   19  Temp: 97.8 F (36.6 C)   98.1 F (36.7 C)  TempSrc: Oral     SpO2: 96%  95% 98%  Weight:  77.9 kg    Height:        Intake/Output Summary (Last 24 hours) at 02/15/2023 1642 Last data filed at 02/15/2023 1500 Gross per 24 hour  Intake 3008.98 ml  Output 950  ml  Net 2058.98 ml   Filed Weights   02/13/23 0418 02/14/23 0322 02/15/23 0417  Weight: 74.6 kg 75.3 kg 77.9 kg    Examination:  General: No acute distress. Cardiovascular: RRR Lungs: unlabored Abdomen: Soft, nontender, nondistended Neurological: Alert . Moves all extremities 4 . Cranial nerves II through XII grossly intact. Extremities: No clubbing or cyanosis. No edema.   Data Reviewed: I have personally reviewed following labs and imaging studies  CBC: Recent Labs  Lab 02/11/23 0730 02/11/23 1851 02/12/23 0300 02/13/23 0015 02/14/23 0220 02/14/23 2345 02/15/23 1021  WBC 12.8*  --   --  11.6* 10.6* 10.5 9.0  NEUTROABS  --   --   --   --  8.1*  --   --   HGB 5.9*   < > 7.3* 7.2* 7.2* 7.0* 6.9*  HCT 19.0*   < > 22.6* 23.4* 22.9* 22.3* 22.8*  MCV 95.5  --   --  91.8 92.7 92.5 93.4  PLT 67*  --   --  84* 114* 137* 146*   < > = values in this interval not displayed.    Basic Metabolic Panel: Recent Labs  Lab 02/09/23 0443 02/09/23 9297 02/10/23 0453 02/11/23 0523 02/13/23 0015 02/14/23 0220 02/14/23 1820 02/14/23 2035 02/14/23 2345 02/15/23 1021  NA 158*   < > 157* 141   < > 150* 132* 148* 150* 149*  K 4.4   < > 3.6 3.0*   < > 3.3* 2.8* 3.2* 3.1* 3.0*  CL 124*   < > 124* 112*   < > 122* 107 122* 122* 119*  CO2 26   < > 25 22   < > 22 19* 22 21* 22  GLUCOSE 155*   < > 175* 526*   < > 146* 692* 115* 119* 128*  BUN 65*   < >  53* 41*   < > 25* 18 20 21 20   CREATININE 1.46*   < > 1.31* 1.37*   < > 0.88 0.85 0.86 0.90 0.95  CALCIUM  8.7*   < > 8.9 7.3*   < > 8.1* 7.0* 8.0* 8.0* 8.1*  MG 2.2  --  2.2 2.0  --  2.3  --   --   --   --   PHOS 3.7  --  3.0 3.4  --  2.7  --   --   --   --    < > = values in this interval not displayed.    GFR: Estimated Creatinine Clearance: 62.4 mL/min (by C-G formula based on SCr of 0.95 mg/dL).  Liver Function Tests: Recent Labs  Lab 02/14/23 0220  AST 33  ALT 30  ALKPHOS 99  BILITOT 1.3*  PROT 6.0*  ALBUMIN  2.1*     CBG: Recent Labs  Lab 02/15/23 0026 02/15/23 0355 02/15/23 0811 02/15/23 1155 02/15/23 1632  GLUCAP 92 113* 114* 97 120*     Recent Results (from the past 240 hours)  Culture, blood (Routine X 2) w Reflex to ID Panel     Status: None   Collection Time: 02/06/23  6:18 PM   Specimen: BLOOD LEFT ARM  Result Value Ref Range Status   Specimen Description   Final    BLOOD LEFT ARM Performed at [facility], [address], [place], [place] [address]    Special Requests   Final    BOTTLES DRAWN AEROBIC ONLY Blood Culture results may not be optimal due to an inadequate volume of blood received in culture bottles Performed at [facility], [address] [place], [place] [address]    Culture   Final    NO GROWTH 5 DAYS Performed at [facility], [address] [place], [place] [address]    Report Status 02/11/2023 FINAL  Final  Culture, blood (Routine X 2) w Reflex to ID Panel     Status: Abnormal   Collection Time: 02/06/23  6:18 PM   Specimen: BLOOD LEFT HAND  Result Value Ref Range Status   Specimen Description   Final    BLOOD LEFT HAND Performed at [facility], [address], [place], [place] [address]    Special Requests   Final    BOTTLES DRAWN AEROBIC ONLY Blood Culture adequate volume Performed at [facility], [address] [place], [place] [address]    Culture  Setup Time   Final    GRAM POSITIVE COCCI AEROBIC BOTTLE ONLY CRITICAL RESULT CALLED TO, READ BACK BY AND VERIFIED WITH: M LILLISTON,PHARMD@0623  02/09/23 MK    Culture (Reise Hietala)  Final    STAPHYLOCOCCUS EPIDERMIDIS THE SIGNIFICANCE OF ISOLATING THIS ORGANISM FROM Modell Fendrick SINGLE SET OF BLOOD CULTURES WHEN MULTIPLE SETS ARE DRAWN IS UNCERTAIN. PLEASE NOTIFY THE MICROBIOLOGY DEPARTMENT WITHIN ONE WEEK IF SPECIATION AND SENSITIVITIES ARE REQUIRED. Performed at [facility], [address], [place], [place] [address]    Report Status 02/10/2023 FINAL   Final  Blood Culture ID Panel (Reflexed)     Status: Abnormal   Collection Time: 02/06/23  6:18 PM  Result Value Ref Range Status   Enterococcus faecalis NOT DETECTED NOT DETECTED Final   Enterococcus Faecium NOT DETECTED NOT DETECTED Final   Listeria monocytogenes NOT DETECTED NOT DETECTED Final   Staphylococcus species DETECTED (Kayne Yuhas) NOT DETECTED Final    Comment: CRITICAL RESULT CALLED  TO, READ BACK BY AND VERIFIED WITH: M LILLISTON,PHARMD@0623  02/09/23 MK    Staphylococcus aureus (BCID) NOT DETECTED NOT DETECTED Final   Staphylococcus epidermidis DETECTED (Nayelis Bonito) NOT DETECTED Final    Comment: Methicillin (oxacillin) resistant coagulase negative staphylococcus. Possible blood culture contaminant (unless isolated from more than one blood culture draw or clinical case suggests pathogenicity). No antibiotic treatment is indicated for blood  culture contaminants. CRITICAL RESULT CALLED TO, READ BACK BY AND VERIFIED WITH: M LILLISTON,PHARMD@0623  02/09/23 MK    Staphylococcus lugdunensis NOT DETECTED NOT DETECTED Final   Streptococcus species NOT DETECTED NOT DETECTED Final   Streptococcus agalactiae NOT DETECTED NOT DETECTED Final   Streptococcus pneumoniae NOT DETECTED NOT DETECTED Final   Streptococcus pyogenes NOT DETECTED NOT DETECTED Final   Amaura Authier.calcoaceticus-baumannii NOT DETECTED NOT DETECTED Final   Bacteroides fragilis NOT DETECTED NOT DETECTED Final   Enterobacterales NOT DETECTED NOT DETECTED Final   Enterobacter cloacae complex NOT DETECTED NOT DETECTED Final   Escherichia coli NOT DETECTED NOT DETECTED Final   Klebsiella aerogenes NOT DETECTED NOT DETECTED Final   Klebsiella oxytoca NOT DETECTED NOT DETECTED Final   Klebsiella pneumoniae NOT DETECTED NOT DETECTED Final   Proteus species NOT DETECTED NOT DETECTED Final   Salmonella species NOT DETECTED NOT DETECTED Final   Serratia marcescens NOT DETECTED NOT DETECTED Final   Haemophilus influenzae NOT DETECTED NOT DETECTED  Final   Neisseria meningitidis NOT DETECTED NOT DETECTED Final   Pseudomonas aeruginosa NOT DETECTED NOT DETECTED Final   Stenotrophomonas maltophilia NOT DETECTED NOT DETECTED Final   Candida albicans NOT DETECTED NOT DETECTED Final   Candida auris NOT DETECTED NOT DETECTED Final   Candida glabrata NOT DETECTED NOT DETECTED Final   Candida krusei NOT DETECTED NOT DETECTED Final   Candida parapsilosis NOT DETECTED NOT DETECTED Final   Candida tropicalis NOT DETECTED NOT DETECTED Final   Cryptococcus neoformans/gattii NOT DETECTED NOT DETECTED Final   Methicillin resistance mecA/C DETECTED (Tc Kapusta) NOT DETECTED Final    Comment: CRITICAL RESULT CALLED TO, READ BACK BY AND VERIFIED WITH: M LILLISTON,PHARMD@0623  02/09/23 MK Performed at [facility], [address], [place], [place] [address]   Urine Culture     Status: None   Collection Time: 02/07/23  8:42 AM   Specimen: Urine, Cystoscope  Result Value Ref Range Status   Specimen Description   Final    URINE, CLEAN CATCH Performed at [facility], [address], [place], [place] [address]    Special Requests   Final    URINE CYSTOSCOPY Performed at [facility], [address], [place], [place] [address]    Culture   Final    NO GROWTH Performed at [facility], [address], [place], [place] [address]    Report Status 02/08/2023 FINAL  Final         Radiology Studies: No results found.       Scheduled Meds:  sodium chloride    Intravenous Once   sodium chloride    Intravenous Once   allopurinol   50 mg Oral q morning   arformoterol   15 mcg Nebulization BID   budesonide  (PULMICORT ) nebulizer solution  0.5 mg Nebulization BID   Chlorhexidine  Gluconate Cloth  6 each Topical Daily   docusate  100 mg Oral Daily   feeding supplement  1 Bottle Oral TID BM   insulin  aspart  0-9 Units Subcutaneous Q4H   multivitamin with minerals  1 tablet Oral Daily   mouth rinse  15  mL  Mouth Rinse 4 times per day   pantoprazole  (PROTONIX ) IV  40 mg Intravenous Q12H   potassium chloride   40 mEq Oral Q4H   sodium chloride  flush  10-40 mL Intracatheter Q12H   sodium chloride  flush  3-10 mL Intravenous Q12H   Continuous Infusions:  sodium chloride  Stopped (02/07/23 1036)   dextrose  75 mL/hr at 02/15/23 0641   heparin  1,650 Units/hr (02/15/23 1556)   meropenem  (MERREM ) IV 1 g (02/15/23 1434)     LOS: 12 days    Time spent: over 30 min    Meliton Monte, MD [facility]   To contact the attending provider between 7A-7P or the covering provider during after hours 7P-7A, please log into the web site [email or web address] and access using universal [place] password for that web site. If you do not have the password, please call the hospital operator.  02/15/2023, 4:42 PM

## 2023-02-15 NOTE — Progress Notes (Signed)
 PHARMACY - ANTICOAGULATION CONSULT NOTE  Pharmacy Consult for heparin   Indication: afib, CVA, s/p mitral valve repair   Allergies  Allergen Reactions   Sulfa Antibiotics Other (See Comments)    Granulocytosis   Sulfamethoxazole-Trimethoprim     Other Reaction(s): Unknown   Zestril  Esperanza.feather ] Cough    Patient Measurements: Height: 6' (182.9 cm) Weight: 77.9 kg (171 lb 11.8 oz) IBW/kg (Calculated) : 77.6 Heparin  Dosing Weight: 75 kg  Vital Signs: Temp: 98.1 F (36.7 C) (02/07 1019) Temp Source: Oral (02/07 0358) BP: 131/60 (02/07 1019) Pulse Rate: 69 (02/07 1019)  Labs: Recent Labs    02/12/23 1525 02/13/23 0015 02/14/23 0220 02/14/23 1431 02/14/23 1820 02/14/23 2035 02/14/23 2345 02/15/23 1021  HGB  --    < > 7.2*  --   --   --  7.0* 6.9*  HCT  --    < > 22.9*  --   --   --  22.3* 22.8*  PLT  --    < > 114*  --   --   --  137* 146*  LABPROT 17.9*  --  17.8*  --   --   --  17.2*  --   INR 1.5*  --  1.4*  --   --   --  1.4*  --   HEPARINUNFRC  --    < > <0.10* <0.10*  --   --  <0.10* 0.12*  CREATININE  --    < > 0.88  --  0.85 0.86 0.90  --    < > = values in this interval not displayed.    Estimated Creatinine Clearance: 65.9 mL/min (by C-G formula based on SCr of 0.9 mg/dL).   Medical History: Past Medical History:  Diagnosis Date   Acquired dilation of ascending aorta and aortic root (HCC)    43mm aortic root and 41mm ascending aorta on echo 03/2020   Arthritis    Bladder stones    Borderline diabetes    BPH (benign prostatic hyperplasia)    CAD (coronary artery disease) 12/19/2020   Non-obstructive coronary artery disease at cath in 2019   Complication of anesthesia    problem with voiding after anesthesia,   Coronary artery disease    cardiologist-  dr lovette mar gerhart NP--- per cath 06-02-2010 non-obstructive cad pLAD 30-40%   CVA (cerebral vascular accident) (HCC) 10/24/2016   Diverticulosis of colon    Dysrhythmia    afib   Gout     Heart murmur    History of adenomatous polyp of colon    2002-- tubular adenoma   History of aortic insufficiency    severe -- s/p  AVR 08-03-2010   History of small bowel obstruction    02/ 2007 mechanical sbo s/p  surgical intervention;  partial sbo 09/ 2011 and 03-20-2011 resolved without surgical intervention   History of urinary retention    HTN (hypertension)    Other secondary pulmonary hypertension (HCC) 03/20/2022   TTE 03/19/22: EF 65-70, no RWMA, moderate LVH, normal RVSF, severe pulmonary hypertension (RVSP 60.4), severe BAE, normal structure and function of mitral valve repair (mean gradient 9), mild TR, trivial AI, normal structure and function of AVR (mean 15.4), moderate PI, aortic root and ascending aorta 40 mm, RAP 15   Peripheral neuropathy    Persistent atrial fibrillation (HCC)    Pre-diabetes    S/P aortic valve replacement with prosthetic valve 08/03/2010   tissue valve   S/P Maze operation for atrial fibrillation 01/30/2018  Complete bilateral atrial lesion set using bipolar radiofrequency and cryothermy with clipping of LA appendage   S/P MVR (mitral valve repair) 01/30/2018   Complex valvuloplasty including artificial Gore-tex neochord placement x4 and Carbo medics Annuloflex ring annuloplasty, size 28   S/P patent foramen ovale closure 08/03/2010   at same time AVR   S/P tricuspid valve repair 01/30/2018   Using an MC3 Annuloplasty ring, size 28   Stroke (HCC)    Thrombocytopenia (HCC)    Tricuspid regurgitation     Medications:  - PTA warfarin regimen: 5 mg daily except for 7.5 mg on Mondays   Assessment: [name] with hx afib, CVA and mitral valve repair on warfarin PTA who presented to the ED on 02/02/23 with SOB. Warfarin resumed on admission and changed to heparin  drip on 02/03/23 d/t n/v and SBO. During this admission, he was noted to have hemorrhage of a right kidney cyst and small intramuscular bleed within the left iliopsoas per renal  CT on 02/06/23, cystoscopy with stent placement on 02/07/23, a new acute stroke on 02/09/23 and is currently being treated for enterobacter bacteremia.  Today, 02/15/2023: -HL 0.12, low but now detectable on heparin  infusion at 1400 units/hr -Hgb down to 6.9, plt trending up -No complications of therapy noted  Goal of Therapy:  Heparin  level 0.3-0.5 units/ml Monitor platelets by anticoagulation protocol: Yes   Plan:  -Increase heparin  infusion to 1650 units/hr -Check 8 hr heparin  level  -Daily CBC -Monitor for s/sx bleeding    [name], PharmD, BCPS Clinical Pharmacist 02/15/2023 12:00 PM

## 2023-02-15 NOTE — Progress Notes (Signed)
 PHARMACY - ANTICOAGULATION CONSULT NOTE  Pharmacy Consult for warfarin --> heparin   Indication: afib, CVA, s/p mitral valve repair   Allergies  Allergen Reactions   Sulfa Antibiotics Other (See Comments)    Granulocytosis   Sulfamethoxazole-Trimethoprim     Other Reaction(s): Unknown   Zestril  [Lisinopril ] Cough    Patient Measurements: Height: 6' (182.9 cm) Weight: 75.3 kg (166 lb 0.1 oz) IBW/kg (Calculated) : 77.6 Heparin  Dosing Weight: 75 kg  Vital Signs: Temp: 98.4 F (36.9 C) (02/06 2000) Temp Source: Oral (02/06 2000) BP: 132/73 (02/06 2000) Pulse Rate: 83 (02/06 2000)  Labs: Recent Labs    02/12/23 1525 02/13/23 0015 02/13/23 1633 02/14/23 0220 02/14/23 1431 02/14/23 1820 02/14/23 2035 02/14/23 2345  HGB  --  7.2*  --  7.2*  --   --   --  7.0*  HCT  --  23.4*  --  22.9*  --   --   --  22.3*  PLT  --  84*  --  114*  --   --   --  137*  LABPROT 17.9*  --   --  17.8*  --   --   --  17.2*  INR 1.5*  --   --  1.4*  --   --   --  1.4*  HEPARINUNFRC  --   --   --  <0.10* <0.10*  --   --  <0.10*  CREATININE  --  1.11   < > 0.88  --  0.85 0.86 0.90   < > = values in this interval not displayed.    Estimated Creatinine Clearance: 63.9 mL/min (by C-G formula based on SCr of 0.9 mg/dL).   Medical History: Past Medical History:  Diagnosis Date   Acquired dilation of ascending aorta and aortic root (HCC)    43mm aortic root and 41mm ascending aorta on echo 03/2020   Arthritis    Bladder stones    Borderline diabetes    BPH (benign prostatic hyperplasia)    CAD (coronary artery disease) 12/19/2020   Non-obstructive coronary artery disease at cath in 2019   Complication of anesthesia    problem with voiding after anesthesia,   Coronary artery disease    cardiologist-  dr lovette mar gerhart NP--- per cath 06-02-2010 non-obstructive cad pLAD 30-40%   CVA (cerebral vascular accident) (HCC) 10/24/2016   Diverticulosis of colon    Dysrhythmia    afib   Gout     Heart murmur    History of adenomatous polyp of colon    2002-- tubular adenoma   History of aortic insufficiency    severe -- s/p  AVR 08-03-2010   History of small bowel obstruction    02/ 2007 mechanical sbo s/p  surgical intervention;  partial sbo 09/ 2011 and 03-20-2011 resolved without surgical intervention   History of urinary retention    HTN (hypertension)    Other secondary pulmonary hypertension (HCC) 03/20/2022   TTE 03/19/22: EF 65-70, no RWMA, moderate LVH, normal RVSF, severe pulmonary hypertension (RVSP 60.4), severe BAE, normal structure and function of mitral valve repair (mean gradient 9), mild TR, trivial AI, normal structure and function of AVR (mean 15.4), moderate PI, aortic root and ascending aorta 40 mm, RAP 15   Peripheral neuropathy    Persistent atrial fibrillation (HCC)    Pre-diabetes    S/P aortic valve replacement with prosthetic valve 08/03/2010   tissue valve   S/P Maze operation for atrial fibrillation 01/30/2018  Complete bilateral atrial lesion set using bipolar radiofrequency and cryothermy with clipping of LA appendage   S/P MVR (mitral valve repair) 01/30/2018   Complex valvuloplasty including artificial Gore-tex neochord placement x4 and Carbo medics Annuloflex ring annuloplasty, size 28   S/P patent foramen ovale closure 08/03/2010   at same time AVR   S/P tricuspid valve repair 01/30/2018   Using an MC3 Annuloplasty ring, size 28   Stroke (HCC)    Thrombocytopenia (HCC)    Tricuspid regurgitation     Medications:  - PTA warfarin regimen: 5 mg daily except for 7.5 mg on Mondays   Assessment: [name] with hx afib, CVA and mitral valve repair on warfarin PTA who presented to the ED on 02/02/23 with SOB. Warfarin resumed on admission and changed to heparin  drip on 02/03/23 d/t n/v and SBO. During this admission, he was noted to have hemorrhage of a right kidney cyst and small intramuscular bleed within the left iliopsoas per  renal CT on 02/06/23, cystoscopy with stent placement on 02/07/23, a new acute stroke on 02/09/23 and is currently being treated for enterobacter bacteremia.  Today, 02/15/2023: - HL continues to be undetectable despite increases - no bleeding noted per RN  Goal of Therapy:  Heparin  level 0.3-0.5 units/ml Monitor platelets by anticoagulation protocol: Yes   Plan:  - Increase heparin  drip to 1400 units/hr - check 8 hr heparin  level  - monitor for s/sx bleeding   Leeroy Mace RPh 02/15/2023, 12:53 AM

## 2023-02-15 NOTE — Progress Notes (Signed)
 Brief Pharmacy Anti-Coagulation Note:  For full details see note from [facility]  A/P: HL 0.3 therapeutic on 1650 units/hr (this was drawn peripherally) Per RN no bleeding noted Continue heparin  drip at 1650 units/hr Heparin  level in 8 hours Daily CBC  Leeroy Mace RPh 02/15/2023, 11:58 PM

## 2023-02-15 NOTE — Plan of Care (Signed)
  Problem: Education: Goal: Ability to demonstrate management of disease process will improve Outcome: Progressing   Problem: Education: Goal: Ability to verbalize understanding of medication therapies will improve Outcome: Progressing   Problem: Activity: Goal: Capacity to carry out activities will improve Outcome: Progressing

## 2023-02-15 NOTE — Progress Notes (Addendum)
 Speech Language Pathology Treatment: Dysphagia  Patient Details Name: [name]. MRN: [id number] DOB: 1935/12/27 [name] Date: 02/15/2023 Time: 8484-8470 SLP Time Calculation (min) (ACUTE ONLY): 14 min  Assessment / Plan / Recommendation Clinical Impression  SLP session for follow-up regarding patient's swallow function. Patient had just returned from walking with [name] his nurse tech and admits he is fatigued. Pt continues to endorse dysphagia to food more than liquids. He reports he expectorated grits, indicating they had not cleared his pharynx. He has h/o dysphagia from prior CVAs and he required thickener usage after his first event and has some baseline dysphagia. States his baseline swallow is level 8/10 (10 normal) but now reports swallow function to be a 4/10.   Currently eating is not enjoyable due to work required to eat per pt. SLP advised pt he could obtain nutrition through liquid supplements but encouraged him to try solids consistently to help to strengthen his swallow.   Reviewed flouroscopy loops with pt from his study six days prior when he asked if leaning forward or tucking chin would help his swallow. Under flouroscopy pt was tested with chin tuck and head turn left postures *due to right brain CVA* and neither were effective. States he can't drink continuously as he was able prior to this event. Pt admits his swallow dysfunction is also accompanied by overall deconditioning.   SLP advised that he may not return to baseline level of swallowing during this acute stay, but reviewed all 3 exercises given to pt 2 days prior and encouraged him to continue them. He reports he used his RMT device today only once.    Pt today seems fatigued- and he concurs. Session abbreviated as he needed blood, causing SLP to ? if this may contribute to his fatigue.   Will follow up next week for indication of repeat MBS and dysphagia treatment. Will reach out to dietician to assure pt  is receiving adequate nutritional supplements given improved efficiency of swallow with these compared to solids.   Advanced diet to dys3/thin to provide pt with more food options as pt is using caution with all po.   Please order SLP follow up as well to help pt rehab his swallow ability and to address cognitive linguistic changes associated with this neuro event.    HPI HPI: 88yo male admitted 02/02/23 with SOB 2/2 CHF exacerbation with possible aspiration event. PMH: acquired dilation of the ascending aorta and aortic root, CAD, AFib, Right basal ganglia CVA (2018), HTN, heart murmur, AVR, OA, BPH, SBO, neuropathy, thrombocytopenia.  PMH+ for dysphagia from CVA 2018 requiring thickener and another CVA that did not impair his swallowing. Pt developed some respiratory difficulties this am - and was placed on 4 L HFNC - s/p surgery with urology stent and then sent from surgery to ICU on vasopressors and Bipap 1/30. Subsequently taken off Bipap and awoke adequately for po. MBS on 02/09/23 recommended full liquids/thin consistency however after returning from MBS, patient with s/s stroke and had MRI brain which showed Small acute left frontal and left temporal white matter infarcts.  Pt is s/p repeat evaluation after cva.  Follow up re: swallowing indicated.      SLP Plan  Continue with current plan of care      Recommendations for follow up therapy are one component of a multi-disciplinary discharge planning process, led by the attending physician.  Recommendations may be updated based on patient status, additional functional criteria and insurance authorization.    Recommendations  Diet recommendations: Dysphagia 3 (mechanical soft);Thin liquid Liquids provided via: Cup;No straw Medication Administration: Whole meds with puree Supervision: Patient able to self feed Compensations: Slow rate;Small sips/bites (multiple swallows, drink liquids t/o meal) Postural Changes and/or Swallow Maneuvers:  Seated upright 90 degrees;Upright 30-60 min after meal                  Oral care BID     Dysphagia, oropharyngeal phase (R13.12)     Continue with current plan of care  [name], MS [facility] SLP Acute Rehab Services Office [phone]    [name]  02/15/2023, 8:27 PM

## 2023-02-15 NOTE — TOC Progression Note (Signed)
 Transition of Care (TOC) - Progression Note    Patient Details  Name: [name]. MRN: [id number] Date of Birth: September 24, 1935  Transition of Care [facility]) CM/SW Contact  Tawni CHRISTELLA Eva, LCSW Phone Number: 02/15/2023, 5:01 PM  Clinical Narrative:    CSW spoke with the pt's son, who is requesting [facility] for home health physical therapy/occupational therapy  and an aide. CSW sent a referral to the [employer] representative for [facility] and is still awaiting a call back. TOC to follow.   Expected Discharge Plan: Home w Home Health Services Barriers to Discharge: Continued Medical Work up  Expected Discharge Plan and Services In-house Referral: Clinical Social Work     Living arrangements for the past 2 months: Single Family Home                                       Social Determinants of Health (SDOH) Interventions SDOH Screenings   Food Insecurity: No Food Insecurity (02/03/2023)  Housing: Low Risk  (02/03/2023)  Transportation Needs: No Transportation Needs (02/03/2023)  Utilities: Not At Risk (02/03/2023)  Alcohol  Screen: Low Risk  (06/02/2021)  Depression (PHQ2-9): Low Risk  (06/16/2021)  Financial Resource Strain: Low Risk  (06/16/2021)  Social Connections: Unknown (02/03/2023)  Stress: No Stress Concern Present (06/16/2021)  Tobacco Use: Low Risk  (02/07/2023)    Readmission Risk Interventions    02/11/2023    4:00 PM 11/10/2021    3:54 PM  Readmission Risk Prevention Plan  Transportation Screening Complete Complete  PCP or Specialist Appt within 3-5 Days Not Complete Complete  Not Complete comments Patient will discharge to SNF.   HRI or Home Care Consult Complete Complete  Social Work Consult for Recovery Care Planning/Counseling Complete Complete  Palliative Care Screening Not Applicable Not Applicable  Medication Review Oceanographer) Complete Complete

## 2023-02-15 NOTE — Plan of Care (Signed)
  Problem: Education: Goal: Ability to demonstrate management of disease process will improve Outcome: Progressing Goal: Ability to verbalize understanding of medication therapies will improve Outcome: Progressing   Problem: Activity: Goal: Capacity to carry out activities will improve Outcome: Progressing   Problem: Cardiac: Goal: Ability to achieve and maintain adequate cardiopulmonary perfusion will improve Outcome: Progressing   Problem: Education: Goal: Knowledge of General Education information will improve Description: Including pain rating scale, medication(s)/side effects and non-pharmacologic comfort measures Outcome: Progressing   Problem: Health Behavior/Discharge Planning: Goal: Ability to manage health-related needs will improve Outcome: Progressing   Problem: Clinical Measurements: Goal: Ability to maintain clinical measurements within normal limits will improve Outcome: Progressing Goal: Will remain free from infection Outcome: Progressing Goal: Diagnostic test results will improve Outcome: Progressing Goal: Respiratory complications will improve Outcome: Progressing Goal: Cardiovascular complication will be avoided Outcome: Progressing   Problem: Activity: Goal: Risk for activity intolerance will decrease Outcome: Progressing   Problem: Nutrition: Goal: Adequate nutrition will be maintained Outcome: Progressing   Problem: Coping: Goal: Level of anxiety will decrease Outcome: Progressing   Problem: Elimination: Goal: Will not experience complications related to bowel motility Outcome: Progressing Goal: Will not experience complications related to urinary retention Outcome: Progressing   Problem: Pain Managment: Goal: General experience of comfort will improve and/or be controlled Outcome: Progressing   Problem: Safety: Goal: Ability to remain free from injury will improve Outcome: Progressing   Problem: Skin Integrity: Goal: Risk for  impaired skin integrity will decrease Outcome: Progressing   Problem: Education: Goal: Knowledge of disease or condition will improve Outcome: Progressing Goal: Knowledge of secondary prevention will improve (MUST DOCUMENT ALL) Outcome: Progressing Goal: Knowledge of patient specific risk factors will improve (DELETE if not current risk factor) Outcome: Progressing   Problem: Ischemic Stroke/TIA Tissue Perfusion: Goal: Complications of ischemic stroke/TIA will be minimized Outcome: Progressing   Problem: Coping: Goal: Will verbalize positive feelings about self Outcome: Progressing Goal: Will identify appropriate support needs Outcome: Progressing   Problem: Health Behavior/Discharge Planning: Goal: Ability to manage health-related needs will improve Outcome: Progressing Goal: Goals will be collaboratively established with patient/family Outcome: Progressing   Problem: Self-Care: Goal: Ability to participate in self-care as condition permits will improve Outcome: Progressing Goal: Verbalization of feelings and concerns over difficulty with self-care will improve Outcome: Progressing Goal: Ability to communicate needs accurately will improve Outcome: Progressing   Problem: Nutrition: Goal: Risk of aspiration will decrease Outcome: Progressing Goal: Dietary intake will improve Outcome: Progressing

## 2023-02-15 NOTE — Progress Notes (Signed)
 Consult received for DBIV for heparin  level. Pt has heparin  infusing through PICC. Discussed with RN and pharmacy best plan for collecting lab. Both agree with having a phlebotomist attempt collection to provide a more accurate result.

## 2023-02-15 NOTE — Progress Notes (Signed)
 PHARMACY - ANTICOAGULATION CONSULT NOTE  Pharmacy Consult for heparin  + warfarin Indication: afib, CVA, s/p mitral valve repair   Allergies  Allergen Reactions   Sulfa Antibiotics Other (See Comments)    Granulocytosis   Sulfamethoxazole-Trimethoprim     Other Reaction(s): Unknown   Zestril  [Lisinopril ] Cough    Patient Measurements: Height: 6' (182.9 cm) Weight: 77.9 kg (171 lb 11.8 oz) IBW/kg (Calculated) : 77.6 Heparin  Dosing Weight: n/a. Use total body weight 78 kg  Vital Signs: Temp: 98.1 F (36.7 C) (02/07 1019) BP: 131/60 (02/07 1019) Pulse Rate: 69 (02/07 1019)  Labs: Recent Labs    02/14/23 0220 02/14/23 1431 02/14/23 1820 02/14/23 2035 02/14/23 2345 02/15/23 1021  HGB 7.2*  --   --   --  7.0* 6.9*  HCT 22.9*  --   --   --  22.3* 22.8*  PLT 114*  --   --   --  137* 146*  LABPROT 17.8*  --   --   --  17.2*  --   INR 1.4*  --   --   --  1.4*  --   HEPARINUNFRC <0.10* <0.10*  --   --  <0.10* 0.12*  CREATININE 0.88  --    < > 0.86 0.90 0.95   < > = values in this interval not displayed.    Estimated Creatinine Clearance: 62.4 mL/min (by C-G formula based on SCr of 0.95 mg/dL).   Medical History: Past Medical History:  Diagnosis Date   Acquired dilation of ascending aorta and aortic root (HCC)    43mm aortic root and 41mm ascending aorta on echo 03/2020   Arthritis    Bladder stones    Borderline diabetes    BPH (benign prostatic hyperplasia)    CAD (coronary artery disease) 12/19/2020   Non-obstructive coronary artery disease at cath in 2019   Complication of anesthesia    problem with voiding after anesthesia,   Coronary artery disease    cardiologist-  dr lovette mar gerhart NP--- per cath 06-02-2010 non-obstructive cad pLAD 30-40%   CVA (cerebral vascular accident) (HCC) 10/24/2016   Diverticulosis of colon    Dysrhythmia    afib   Gout    Heart murmur    History of adenomatous polyp of colon    2002-- tubular adenoma   History of aortic  insufficiency    severe -- s/p  AVR 08-03-2010   History of small bowel obstruction    02/ 2007 mechanical sbo s/p  surgical intervention;  partial sbo 09/ 2011 and 03-20-2011 resolved without surgical intervention   History of urinary retention    HTN (hypertension)    Other secondary pulmonary hypertension (HCC) 03/20/2022   TTE 03/19/22: EF 65-70, no RWMA, moderate LVH, normal RVSF, severe pulmonary hypertension (RVSP 60.4), severe BAE, normal structure and function of mitral valve repair (mean gradient 9), mild TR, trivial AI, normal structure and function of AVR (mean 15.4), moderate PI, aortic root and ascending aorta 40 mm, RAP 15   Peripheral neuropathy    Persistent atrial fibrillation (HCC)    Pre-diabetes    S/P aortic valve replacement with prosthetic valve 08/03/2010   tissue valve   S/P Maze operation for atrial fibrillation 01/30/2018   Complete bilateral atrial lesion set using bipolar radiofrequency and cryothermy with clipping of LA appendage   S/P MVR (mitral valve repair) 01/30/2018   Complex valvuloplasty including artificial Gore-tex neochord placement x4 and Carbo medics Annuloflex ring annuloplasty, size 28  S/P patent foramen ovale closure 08/03/2010   at same time AVR   S/P tricuspid valve repair 01/30/2018   Using an MC3 Annuloplasty ring, size 28   Stroke (HCC)    Thrombocytopenia (HCC)    Tricuspid regurgitation     Medications:  -PTA warfarin regimen: 5 mg daily except for 7.5 mg on Mondays  -Last dose: 1/25  Assessment: [name] with hx afib, CVA and mitral valve repair on warfarin PTA who presented to the ED on 02/02/23 with SOB. Pt found to have enterobacter cloacae bacteremia and remains on meropenem .   One dose of warfarin was given on 1/25. Oral anticoagulation was then held due to N/V, SBO. Despite holding warfarin, INR remained therapeutic until 2/2.   During this admission, he was noted to have hemorrhage of a right kidney cyst  and small intramuscular bleed within the left iliopsoas per renal CT on 02/06/23, then underwent cystoscopy with stent placement on 02/07/23. Thrombocytopenia throughout admission, Plt as low as 23 on 1/30.  Pt suffered from acute ischemic stroke on 2/1. MRI brain on 2/2 reveals small acute left frontal and left temporal white matter infarcts. Neurology consulted, recommended to repeat head CT when ready to resume anticoagulation. CT Head on 2/5: No evidence of hemorrhagic conversion.   Pharmacy consulted to resume heparin  drip on 2/5 (instructions for no bolus, low goal), and warfarin on 2/7.   Today, 02/15/2023: INR = 1.4 is subtherapeutic Heparin  level ordered for 8 pm tonight, not yet drawn CBC: Hgb (6.9) remains low, Plt trending up Confirmed with RN - no signs of bleeding  Goal of Therapy:  INR 2-3 Heparin  level 0.3-0.5 units/ml Monitor platelets by anticoagulation protocol: Yes   Plan:  Warfarin 5 mg PO once Continue heparin  infusion at 1650 units/hr pending heparin  level result CBC, heparin  level, INR daily Monitor for signs of bleeding  [name], PharmD, BCPS Clinical Pharmacist 02/15/2023 4:45 PM

## 2023-02-16 DIAGNOSIS — J9601 Acute respiratory failure with hypoxia: Secondary | ICD-10-CM | POA: Diagnosis not present

## 2023-02-16 LAB — COMPREHENSIVE METABOLIC PANEL
ALT: 24 U/L (ref 0–44)
AST: 28 U/L (ref 15–41)
Albumin: 2.3 g/dL — ABNORMAL LOW (ref 3.5–5.0)
Alkaline Phosphatase: 95 U/L (ref 38–126)
Anion gap: 7 (ref 5–15)
BUN: 16 mg/dL (ref 8–23)
CO2: 20 mmol/L — ABNORMAL LOW (ref 22–32)
Calcium: 8.5 mg/dL — ABNORMAL LOW (ref 8.9–10.3)
Chloride: 121 mmol/L — ABNORMAL HIGH (ref 98–111)
Creatinine, Ser: 0.93 mg/dL (ref 0.61–1.24)
GFR, Estimated: >60 mL/min (ref >60)
Glucose, Bld: 102 mg/dL — ABNORMAL HIGH (ref 70–99)
Potassium: 3.6 mmol/L (ref 3.5–5.1)
Sodium: 148 mmol/L — ABNORMAL HIGH (ref 135–145)
Total Bilirubin: 1.3 mg/dL — ABNORMAL HIGH (ref 0.0–1.2)
Total Protein: 6.2 g/dL — ABNORMAL LOW (ref 6.5–8.1)

## 2023-02-16 LAB — HEPARIN LEVEL (UNFRACTIONATED)
Heparin Unfractionated: 0.22 [IU]/mL — ABNORMAL LOW (ref 0.30–0.70)
Heparin Unfractionated: 0.37 [IU]/mL (ref 0.30–0.70)

## 2023-02-16 LAB — DIFFERENTIAL
Abs Immature Granulocytes: 0.03 10*3/uL (ref 0.00–0.07)
Basophils Absolute: 0 10*3/uL (ref 0.0–0.1)
Basophils Relative: 0 %
Eosinophils Absolute: 0.2 10*3/uL (ref 0.0–0.5)
Eosinophils Relative: 2 %
Immature Granulocytes: 0 %
Lymphocytes Relative: 21 %
Lymphs Abs: 1.7 10*3/uL (ref 0.7–4.0)
Monocytes Absolute: 0.6 10*3/uL (ref 0.1–1.0)
Monocytes Relative: 7 %
Neutro Abs: 5.6 10*3/uL (ref 1.7–7.7)
Neutrophils Relative %: 70 %

## 2023-02-16 LAB — GLUCOSE, CAPILLARY
Glucose-Capillary: 109 mg/dL — ABNORMAL HIGH (ref 70–99)
Glucose-Capillary: 115 mg/dL — ABNORMAL HIGH (ref 70–99)
Glucose-Capillary: 123 mg/dL — ABNORMAL HIGH (ref 70–99)
Glucose-Capillary: 123 mg/dL — ABNORMAL HIGH (ref 70–99)
Glucose-Capillary: 95 mg/dL (ref 70–99)
Glucose-Capillary: 95 mg/dL (ref 70–99)

## 2023-02-16 LAB — CBC WITH DIFFERENTIAL/PLATELET
Abs Immature Granulocytes: 0.06 10*3/uL (ref 0.00–0.07)
Basophils Absolute: 0 10*3/uL (ref 0.0–0.1)
Basophils Relative: 0 %
Eosinophils Absolute: 0.2 10*3/uL (ref 0.0–0.5)
Eosinophils Relative: 3 %
HCT: 26.5 % — ABNORMAL LOW (ref 39.0–52.0)
Hemoglobin: 8.3 g/dL — ABNORMAL LOW (ref 13.0–17.0)
Immature Granulocytes: 1 %
Lymphocytes Relative: 19 %
Lymphs Abs: 1.7 10*3/uL (ref 0.7–4.0)
MCH: 29.1 pg (ref 26.0–34.0)
MCHC: 31.3 g/dL (ref 30.0–36.0)
MCV: 93 fL (ref 80.0–100.0)
Monocytes Absolute: 0.5 10*3/uL (ref 0.1–1.0)
Monocytes Relative: 6 %
Neutro Abs: 6.2 10*3/uL (ref 1.7–7.7)
Neutrophils Relative %: 71 %
Platelets: 163 10*3/uL (ref 150–400)
RBC: 2.85 MIL/uL — ABNORMAL LOW (ref 4.22–5.81)
RDW: 17.6 % — ABNORMAL HIGH (ref 11.5–15.5)
WBC: 8.7 10*3/uL (ref 4.0–10.5)
nRBC: 0 % (ref 0.0–0.2)

## 2023-02-16 LAB — TECHNOLOGIST SMEAR REVIEW

## 2023-02-16 LAB — PROTIME-INR
INR: 1.3 — ABNORMAL HIGH (ref 0.8–1.2)
Prothrombin Time: 16.5 s — ABNORMAL HIGH (ref 11.4–15.2)

## 2023-02-16 LAB — PHOSPHORUS: Phosphorus: 2.4 mg/dL — ABNORMAL LOW (ref 2.5–4.6)

## 2023-02-16 LAB — MAGNESIUM: Magnesium: 2 mg/dL (ref 1.7–2.4)

## 2023-02-16 MED ORDER — DEXTROSE 5 % IV SOLN: INTRAVENOUS | Status: AC

## 2023-02-16 MED ORDER — PANTOPRAZOLE SODIUM 40 MG PO TBEC
40.0000 mg | DELAYED_RELEASE_TABLET | Freq: Two times a day (BID) | ORAL | Status: DC
Start: 2023-02-16 — End: 2023-02-22
  Administered 2023-02-16 – 2023-02-22 (×13): 40 mg via ORAL
  Filled 2023-02-16 (×13): qty 1

## 2023-02-16 MED ORDER — WARFARIN SODIUM 5 MG PO TABS
5.0000 mg | ORAL_TABLET | Freq: Once | ORAL | Status: AC
  Administered 2023-02-16: 5 mg via ORAL
  Filled 2023-02-16: qty 1

## 2023-02-16 NOTE — Plan of Care (Signed)

## 2023-02-16 NOTE — Progress Notes (Signed)
 Mobility Specialist - Progress Note   02/16/23 0812  Mobility  Activity Transferred from bed to chair  Level of Assistance Contact guard assist, steadying assist  Assistive Device Front wheel walker  Distance Ambulated (ft) 3 ft  Range of Motion/Exercises Active Assistive  Activity Response Tolerated well  Mobility visit 1 Mobility  Mobility Specialist Start Time (ACUTE ONLY) 0805  Mobility Specialist Stop Time (ACUTE ONLY) [id number]  Mobility Specialist Time Calculation (min) (ACUTE ONLY) 7 min   Assisted NT with transferring pt. Pt able to follow cues and at EOS was left on recliner chair with all needs met. Call bell in reach and NT in room.  Erminio Leos Mobility Specialist

## 2023-02-16 NOTE — Progress Notes (Signed)
 PHARMACY - ANTICOAGULATION CONSULT NOTE  Pharmacy Consult for heparin  + warfarin Indication: afib, CVA, s/p mitral valve repair   Allergies  Allergen Reactions   Sulfa Antibiotics Other (See Comments)    Granulocytosis   Sulfamethoxazole-Trimethoprim     Other Reaction(s): Unknown   Zestril  [Lisinopril ] Cough    Patient Measurements: Height: 6' (182.9 cm) Weight: 77.9 kg (171 lb 11.8 oz) IBW/kg (Calculated) : 77.6 Heparin  Dosing Weight: n/a. Use total body weight 78 kg  Vital Signs: Temp: 98 F (36.7 C) (02/08 0409) Temp Source: Oral (02/08 0409) BP: 141/69 (02/08 0409) Pulse Rate: 74 (02/08 0409)  Labs: Recent Labs    02/14/23 0220 02/14/23 1431 02/14/23 2345 02/15/23 1021 02/15/23 2322 02/16/23 0453  HGB 7.2*  --  7.0* 6.9*  --  8.3*  HCT 22.9*  --  22.3* 22.8*  --  26.5*  PLT 114*  --  137* 146*  --  163  LABPROT 17.8*  --  17.2*  --   --  16.5*  INR 1.4*  --  1.4*  --   --  1.3*  HEPARINUNFRC <0.10*   < > <0.10* 0.12* 0.30 0.22*  CREATININE 0.88   < > 0.90 0.95 0.99  --    < > = values in this interval not displayed.    Estimated Creatinine Clearance: 59.9 mL/min (by C-G formula based on SCr of 0.99 mg/dL).   Medical History: Past Medical History:  Diagnosis Date   Acquired dilation of ascending aorta and aortic root (HCC)    43mm aortic root and 41mm ascending aorta on echo 03/2020   Arthritis    Bladder stones    Borderline diabetes    BPH (benign prostatic hyperplasia)    CAD (coronary artery disease) 12/19/2020   Non-obstructive coronary artery disease at cath in 2019   Complication of anesthesia    problem with voiding after anesthesia,   Coronary artery disease    cardiologist-  dr lovette mar gerhart NP--- per cath 06-02-2010 non-obstructive cad pLAD 30-40%   CVA (cerebral vascular accident) (HCC) 10/24/2016   Diverticulosis of colon    Dysrhythmia    afib   Gout    Heart murmur    History of adenomatous polyp of colon    2002--  tubular adenoma   History of aortic insufficiency    severe -- s/p  AVR 08-03-2010   History of small bowel obstruction    02/ 2007 mechanical sbo s/p  surgical intervention;  partial sbo 09/ 2011 and 03-20-2011 resolved without surgical intervention   History of urinary retention    HTN (hypertension)    Other secondary pulmonary hypertension (HCC) 03/20/2022   TTE 03/19/22: EF 65-70, no RWMA, moderate LVH, normal RVSF, severe pulmonary hypertension (RVSP 60.4), severe BAE, normal structure and function of mitral valve repair (mean gradient 9), mild TR, trivial AI, normal structure and function of AVR (mean 15.4), moderate PI, aortic root and ascending aorta 40 mm, RAP 15   Peripheral neuropathy    Persistent atrial fibrillation (HCC)    Pre-diabetes    S/P aortic valve replacement with prosthetic valve 08/03/2010   tissue valve   S/P Maze operation for atrial fibrillation 01/30/2018   Complete bilateral atrial lesion set using bipolar radiofrequency and cryothermy with clipping of LA appendage   S/P MVR (mitral valve repair) 01/30/2018   Complex valvuloplasty including artificial Gore-tex neochord placement x4 and Carbo medics Annuloflex ring annuloplasty, size 28   S/P patent foramen ovale closure 08/03/2010  at same time AVR   S/P tricuspid valve repair 01/30/2018   Using an MC3 Annuloplasty ring, size 28   Stroke (HCC)    Thrombocytopenia (HCC)    Tricuspid regurgitation     Medications:  -PTA warfarin regimen: 5 mg daily except for 7.5 mg on Mondays  -Last dose: 1/25  Assessment: [name] with hx afib, CVA and mitral valve repair on warfarin PTA who presented to the ED on 02/02/23 with SOB. Pt found to have enterobacter cloacae bacteremia and remains on meropenem .   One dose of warfarin was given on 1/25. Oral anticoagulation was then held due to N/V, SBO. Despite holding warfarin, INR remained therapeutic until 2/2.   During this admission, he was noted to have  hemorrhage of a right kidney cyst and small intramuscular bleed within the left iliopsoas per renal CT on 02/06/23, then underwent cystoscopy with stent placement on 02/07/23. Thrombocytopenia throughout admission, Plt as low as 23 on 1/30.  Pt suffered from acute ischemic stroke on 2/1. MRI brain on 2/2 reveals small acute left frontal and left temporal white matter infarcts. Neurology consulted, recommended to repeat head CT when ready to resume anticoagulation. CT Head on 2/5: No evidence of hemorrhagic conversion.   Pharmacy consulted to resume heparin  drip on 2/5 (instructions for no bolus, low goal), and warfarin on 2/7.   Today, 02/16/2023: INR = 1.3 is subtherapeutic Heparin  level 0.22 subtherapeutic on1650 units/hr CBC: Hgb (6.9) remains low, Plt trending up Confirmed with RN - no signs of bleeding  Goal of Therapy:  INR 2-3 Heparin  level 0.3-0.5 units/ml Monitor platelets by anticoagulation protocol: Yes   Plan:  increase heparin  infusion to 1800 units/hr  Heparin  level in 8 hours Warfarin 5mg  po x 1 today at 1600 CBC, heparin  level, INR daily Monitor for signs of bleeding  Leeroy Mace RPh 02/16/2023, 6:09 AM

## 2023-02-16 NOTE — Progress Notes (Addendum)
 PROGRESS NOTE    [name].  FMW:[id number] DOB: 12/13/1935 DOA: 02/02/2023 PCP: Katina Pfeiffer, PA-C  Chief Complaint  Patient presents with   Shortness of Breath    Brief Narrative:   [name]. is [name] 88 y.o. male who has an extensive PMH as below, significant for CAD, CVA, aortic valve replacement 2012, hypertension, pulmonary hypertension, atrial fibrillation and small bowel obstruction  . He presented to [facility] ED 1/25 with dyspnea that woke him from his sleep the night prior. He had also complained of LE edema over the preceding few weeks. On EMS arrival, he had sats of 85% on room air. He was placed on [place] O2 and given Solumedrol and DuoNebs in route to ED.   In ED, CXR was suggestive of mild edema. He was admitted by TRH and started on diuresis. He was seen by cardiology in consultation who agreed with diuresis for fluid overload and obtaining an echo. Echo was obtained and demonstrated EF 60-65%, mod LVH, RV pressure overload with RVSP 61, LA and RA severely dilated.   On evening of 1/26, he developed nausea and vomiting with probable aspiration event. There was concern for SBO and was seen by general surgery who recommended continuing NPO and continuing NGT to suction.   On 1/27, diuresis was held due to bump in renal function after IV lasix . He was only net negative since admit. He did not appear to have excess volume per cardiology notes and appeared improved from CHF standpoint. Dyspnea and hypoxia were felt to more likely be related to aspiration PNA.  General surgery had also seen and he had benign exam and was passing flatus. He unfortunately pulled out his NGT that day.   After pulling out NGT 1/27, he had episode of tachypnea and increased WOB along with spike in temp to 102.65F. Due to increased WOB, PCCM asked to see in consultation. He had been on 4L [place] prior to this episode.   Blood cultures from admission returned positive for Enterobacter cloacae.  He was on Cefepime  escalated to Meropenem .  Family report concerns of pt aspirating for over Anndee Connett week prior to admit. Further GOC clarified with pt's son, [name], changed to DNR/ DNI.   CT scan on 1/29 showed mild right hydro with 12 mm right proximal ureteral stone.  Urology was c/s and he's now s/p cystoscopy with R ureteral stent placement.  CT also showed hemorrhage within the dominant 8.5 cm cyst in the inferior pole of the right kidney as well as Jessica Checketts small intramuscular bleed within the left iliopsoas.   He was found to have new facial droop and L sided weakness and altered speech on 2/1.  MRI showed acute L frontal and L temporal white matter infarcts.  He was seen by neurology.   Anticoagulation was held.  [place] repeat head CT on 2/5 showed no evidence of hemorrhagic conversion.  Heparin  was restarted on 2/5 PM.   He currently remains on antibiotics for his enterococcus bacteremia.  We're watching cautiously with the reinitiation of anticoagulation.  He's been hypernatremia for the past several days which makes me concerned for his ability to adequately take PO.  Will continue to monitor.   Assessment & Plan:   Principal Problem:   Acute respiratory failure with hypoxia (HCC) Active Problems:   Essential hypertension   Permanent atrial fibrillation (HCC)   CAD (coronary artery disease)   s/p mitral valve repair   Anemia due to chronic blood  loss   Benign prostatic hyperplasia with lower urinary tract symptoms   Thrombocytopenia (HCC)   Gout   Hypokalemia   Left hemiparesis (HCC)   S/P AVR (aortic valve replacement)   History of ischemic right MCA stroke   Hyperglycemia   S/P tricuspid valve repair   S/P Maze operation for atrial fibrillation   Prediabetes   Protein-calorie malnutrition, moderate (HCC)   HLD (hyperlipidemia)   Other secondary pulmonary hypertension (HCC)   Elevated troponin   Volume overload   Aspiration pneumonia (HCC)   DNR (do not resuscitate)   Ureteral  calculus, right   Nephrolithiasis   Sepsis due to Enterobacter [facility])   Renal cyst  Goals of Care  Family Communication Discussed with son, Marsa today.  Will leave as full code at this time based on our discussion.  Had 30+ minute conversation with Dr. Ted Sheng (sister).  She'd appreciate us  reinvolving oncology regarding question of MDS.  Requested Vonnie Ligman smear.  Will reach out to oncology (Dr. Cloretta) on Monday.  She asked for assistance changing his PCP, which I told her would be Salma Walrond decision Mr. Sanzone would need to make (and follow up on with family assistance if needed).  Marolyn is the HCPOA, he's ok with us  discussing plan of care with Dr. Sheng, but doesn't want her to dictate care.  I discussed with Dr. Sheng today that it would be important for her to coordinate communication with Marolyn as typically we prefer that there is one family contact (she expressed some concern that there may be some questions she has as Aleyda Gindlesperger physician that he might not understand or be able to communicate, which is fine, but I expressed the importance of coordinating family communication through 1 main contact).  Acute ischemic stroke: Thought to be related to FFP/platelet infusion MRI brain on 2/1 showed small acute left frontal and left temporal white matter infarct. MR angio showed severe stenosis of right P2 PCA Neurology follow up appreciated -> 02/10/2023 note Repeat head CT without evidence of hemorrhagic conversion  Seen by SLP- dysphagia 2 diet.   Acute hypoxic respiratory failure-multifactorial in the setting of aspiration pneumonia, fluid overload/CHF exacerbation: 02/09/2023: Off of oxygen. Continue aspiration precautions.   Continue pulmonary hygiene. Dysphagia diet.   Enterobacter Colace bacteremia in the setting of UTI  right ureteral stone with mild hydronephrosis: Septic shock-resolved 1/30 was not Sejla Marzano candidate for percutaneous  nephrostomy tube given elevated INR and thrombocytopenia.   S/p cystoscopy with right ureteral stent placement 1/30 Off of pressors now.  Fever improved. WBC improved. Cefepime  changed to Meropenem  due to encephalopathy. Continue antibiotics for total 14 days. He has PICC line placed 02/05/23. 1 of 2 cultures from 1/29 with staph epidermidis, suspect contaminant   Left iliopsoas intramuscular bleed Right renal cyst with hemorrhage  LUE Ecchymoses Discussed renal cyst hemorrhage with urology, ok with resuming anticoagulation Will restart cautiously on heparin , low threshold to hold   Ileus  NGT removed 1/27, passing flatus Rectal tube is no longer in place   Hypernatremia: Hypotonic fluids.  Encourage free water .     AKI: Baseline creatinine 0.9.  Worsened after IV diuresis and shock.  Mild right hydronephrosis with right renal calculi.  Status post stent placement on 1/30 by urology.  Renal function improved.   Afib - on coumadin  Valvular disease s/p bioprosthetic AVR 2012, w/ MV and TV repair  CAD, non-obstructive  HFpEF: Remain euvolemic.  Continue to hold on beta-blocker and Coumadin .  Cardiology signed off on  02/06/2023. INR goal is 2-3.  INR subtherapeutic since 2/2.  heparin  gtt.  Will continue warfarin.   Anemia of chronic disease Acute on chronic Thrombocytopenia Coagulapathy on coumadin   S/p 2 units platelets, 1 unit FFP, 4 units pRBC's  Transfuse additional unit pRBC today With repeat head CT this AM without evidence of hemorrhagic conversion, continue heparin  gtt cautiously, will continue warfarin 2/7 Cardiology notably has recommended heparin  bridge for INR <2 Per oncology, anemia thought due to sepsis, bleeding, renal failure, and phlebotomy - Hb expected to improve over next several weeks    Moderate protein calorie malnutrition:  Dietitian on board Encourage oral diet, supplements.   Hyperglycemia- A1c 6.2 SSI     DVT prophylaxis: SCD Code Status: DNR Family Communication: noen Disposition:   Status is:  Inpatient Remains inpatient appropriate because: need for continued inpatient care   Consultants:  Cardiology s/o 02/06/2023 Urology Neurology Oncology surgery  Procedures:   02/07/2023 Cystoscopy right ureteral stent placement right retrograde pyelography with interpretation   Echo IMPRESSIONS     1. S/P MV repair with mean gradient 8 mmHg and no MR; s/p AVR with mean  gradient 14 mmHg and no AI; s/p TV repair with mild TR.   2. Left ventricular ejection fraction, by estimation, is 60 to 65%. The  left ventricle has normal function. The left ventricle has no regional  wall motion abnormalities. There is moderate left ventricular hypertrophy.  Left ventricular diastolic  parameters are indeterminate. There is the interventricular septum is  flattened in systole and diastole, consistent with right ventricular  pressure and volume overload.   3. Right ventricular systolic function is normal. The right ventricular  size is mildly enlarged. There is severely elevated pulmonary artery  systolic pressure.   4. Left atrial size was severely dilated.   5. Right atrial size was severely dilated.   6. The mitral valve has been repaired/replaced. No evidence of mitral  valve regurgitation. No evidence of mitral stenosis. There is Marcellene Shivley prosthetic  annuloplasty ring present in the mitral position.   7. The tricuspid valve is has been repaired/replaced. The tricuspid valve  is status post repair with an annuloplasty ring.   8. The aortic valve has been repaired/replaced. Aortic valve  regurgitation is not visualized. No aortic stenosis is present. There is Kele Barthelemy  bioprosthetic valve present in the aortic position.   9. The inferior vena cava is dilated in size with <50% respiratory  variability, suggesting right atrial pressure of 15 mmHg.   Carotid US  Summary:  Right Carotid: Velocities in the right ICA are consistent with Ahmir Bracken 1-39%  stenosis.   Left Carotid: Velocities in the left ICA are  consistent with Yuliet Needs 1-39%  stenosis.   Vertebrals: Bilateral vertebral arteries demonstrate antegrade flow.  Subclavians: Normal flow hemodynamics were seen in bilateral subclavian               arteries.    Antimicrobials:  Anti-infectives (From admission, onward)    Start     Dose/Rate Route Frequency Ordered Stop   02/13/23 1130  meropenem  (MERREM ) 1 g in sodium chloride  0.9 % 100 mL IVPB        1 g 200 mL/hr over 30 Minutes Intravenous Every 8 hours 02/13/23 1043     02/09/23 2200  meropenem  (MERREM ) 1 g in sodium chloride  0.9 % 100 mL IVPB  Status:  Discontinued        1 g 200 mL/hr over 30 Minutes Intravenous Every 12 hours 02/09/23  1402 02/13/23 1043   02/08/23 1100  ceFEPIme  (MAXIPIME ) 2 g in sodium chloride  0.9 % 100 mL IVPB  Status:  Discontinued        2 g 200 mL/hr over 30 Minutes Intravenous Every 12 hours 02/08/23 1001 02/09/23 1401   02/06/23 1700  ceFEPIme  (MAXIPIME ) 2 g in sodium chloride  0.9 % 100 mL IVPB  Status:  Discontinued        2 g 200 mL/hr over 30 Minutes Intravenous Every 24 hours 02/06/23 0917 02/08/23 1001   02/05/23 1700  meropenem  (MERREM ) 500 mg in sodium chloride  0.9 % 100 mL IVPB  Status:  Discontinued        500 mg 200 mL/hr over 30 Minutes Intravenous Every 12 hours 02/05/23 0718 02/06/23 0908   02/04/23 1700  meropenem  (MERREM ) 1 g in sodium chloride  0.9 % 100 mL IVPB  Status:  Discontinued        1 g 200 mL/hr over 30 Minutes Intravenous Every 12 hours 02/04/23 1457 02/05/23 0718   02/04/23 1200  Ampicillin -Sulbactam (UNASYN ) 3 g in sodium chloride  0.9 % 100 mL IVPB  Status:  Discontinued        3 g 200 mL/hr over 30 Minutes Intravenous Every 12 hours 02/04/23 0203 02/04/23 0944   02/04/23 1100  ceFEPIme  (MAXIPIME ) 2 g in sodium chloride  0.9 % 100 mL IVPB  Status:  Discontinued        2 g 200 mL/hr over 30 Minutes Intravenous Every 24 hours 02/04/23 0945 02/04/23 1456   02/03/23 1200  Ampicillin -Sulbactam (UNASYN ) 3 g in sodium chloride  0.9 %  100 mL IVPB  Status:  Discontinued        3 g 200 mL/hr over 30 Minutes Intravenous Every 6 hours 02/03/23 1115 02/04/23 0203       Subjective: No complaints  Objective: Vitals:   02/16/23 0409 02/16/23 0934 02/16/23 0938 02/16/23 1141  BP: (!) 141/69   128/69  Pulse: 74   (!) 52  Resp: 17   18  Temp: 98 F (36.7 C)   98.8 F (37.1 C)  TempSrc: Oral     SpO2: 96% 94% 94% 98%  Weight:      Height:        Intake/Output Summary (Last 24 hours) at 02/16/2023 1508 Last data filed at 02/16/2023 0824 Gross per 24 hour  Intake 1496.87 ml  Output 1350 ml  Net 146.87 ml   Filed Weights   02/13/23 0418 02/14/23 0322 02/15/23 0417  Weight: 74.6 kg 75.3 kg 77.9 kg    Examination:  General: No acute distress. Cardiovascular: RRR Lungs: unlabored Abdomen: Soft, nontender, nondistended Neurological: Alert. Moves all extremities 4 with equal strength. Cranial nerves II through XII grossly intact. Extremities: No clubbing or cyanosis. No edema.  Data Reviewed: I have personally reviewed following labs and imaging studies  CBC: Recent Labs  Lab 02/13/23 0015 02/14/23 0220 02/14/23 2345 02/15/23 1021 02/16/23 0453  WBC 11.6* 10.6* 10.5 9.0 8.7  NEUTROABS  --  8.1*  --   --  6.2  HGB 7.2* 7.2* 7.0* 6.9* 8.3*  HCT 23.4* 22.9* 22.3* 22.8* 26.5*  MCV 91.8 92.7 92.5 93.4 93.0  PLT 84* 114* 137* 146* 163    Basic Metabolic Panel: Recent Labs  Lab 02/10/23 0453 02/11/23 0523 02/13/23 0015 02/14/23 0220 02/14/23 1820 02/14/23 2035 02/14/23 2345 02/15/23 1021 02/15/23 2322 02/16/23 0453  NA 157* 141   < > 150*   < > 148* 150* 149* 148* 148*  K 3.6 3.0*   < > 3.3*   < > 3.2* 3.1* 3.0* 3.8 3.6  CL 124* 112*   < > 122*   < > 122* 122* 119* 121* 121*  CO2 25 22   < > 22   < > 22 21* 22 20* 20*  GLUCOSE 175* 526*   < > 146*   < > 115* 119* 128* 108* 102*  BUN 53* 41*   < > 25*   < > 20 21 20 18 16   CREATININE 1.31* 1.37*   < > 0.88   < > 0.86 0.90 0.95 0.99 0.93   CALCIUM  8.9 7.3*   < > 8.1*   < > 8.0* 8.0* 8.1* 8.3* 8.5*  MG 2.2 2.0  --  2.3  --   --   --   --   --  2.0  PHOS 3.0 3.4  --  2.7  --   --   --   --   --  2.4*   < > = values in this interval not displayed.    GFR: Estimated Creatinine Clearance: 63.7 mL/min (by C-G formula based on SCr of 0.93 mg/dL).  Liver Function Tests: Recent Labs  Lab 02/14/23 0220 02/16/23 0453  AST 33 28  ALT 30 24  ALKPHOS 99 95  BILITOT 1.3* 1.3*  PROT 6.0* 6.2*  ALBUMIN  2.1* 2.3*    CBG: Recent Labs  Lab 02/15/23 2021 02/16/23 0028 02/16/23 0405 02/16/23 0719 02/16/23 1138  GLUCAP 91 109* 95 95 115*     Recent Results (from the past 240 hours)  Culture, blood (Routine X 2) w Reflex to ID Panel     Status: None   Collection Time: 02/06/23  6:18 PM   Specimen: BLOOD LEFT ARM  Result Value Ref Range Status   Specimen Description   Final    BLOOD LEFT ARM Performed at [name], [address] [place], [place] [address]    Special Requests   Final    BOTTLES DRAWN AEROBIC ONLY Blood Culture results may not be optimal due to an inadequate volume of blood received in culture bottles Performed at [facility], [address], [place], [place] [address]    Culture   Final    NO GROWTH 5 DAYS Performed at [facility], [address], [place], [place] [address]    Report Status 02/11/2023 FINAL  Final  Culture, blood (Routine X 2) w Reflex to ID Panel     Status: Abnormal   Collection Time: 02/06/23  6:18 PM   Specimen: BLOOD LEFT HAND  Result Value Ref Range Status   Specimen Description   Final    BLOOD LEFT HAND Performed at [facility], [address], [place], [place] [address]    Special Requests   Final    BOTTLES DRAWN AEROBIC ONLY Blood Culture adequate volume Performed at [facility], [address], [place], [place] [address]    Culture  Setup Time   Final    GRAM POSITIVE COCCI AEROBIC BOTTLE  ONLY CRITICAL RESULT CALLED TO, READ BACK BY AND VERIFIED WITH: M LILLISTON,PHARMD@0623  02/09/23 MK    Culture (Muscab Brenneman)  Final    STAPHYLOCOCCUS EPIDERMIDIS THE SIGNIFICANCE OF ISOLATING THIS ORGANISM FROM Tamara Monteith SINGLE SET OF BLOOD CULTURES WHEN MULTIPLE SETS ARE DRAWN IS UNCERTAIN. PLEASE NOTIFY THE MICROBIOLOGY DEPARTMENT WITHIN ONE WEEK IF SPECIATION AND SENSITIVITIES ARE REQUIRED. Performed at [facility], [address] [place], [place] [address]    Report Status 02/10/2023 FINAL  Final  Blood Culture ID Panel (Reflexed)     Status: Abnormal   Collection Time: 02/06/23  6:18 PM  Result Value Ref Range Status   Enterococcus faecalis NOT DETECTED NOT DETECTED Final   Enterococcus Faecium NOT DETECTED NOT DETECTED Final   Listeria monocytogenes NOT DETECTED NOT DETECTED Final   Staphylococcus species DETECTED (Euphemia Lingerfelt) NOT DETECTED Final    Comment: CRITICAL RESULT CALLED TO, READ BACK BY AND VERIFIED WITH: M LILLISTON,PHARMD@0623  02/09/23 MK    Staphylococcus aureus (BCID) NOT DETECTED NOT DETECTED Final   Staphylococcus epidermidis DETECTED (Nickolas Chalfin) NOT DETECTED Final    Comment: Methicillin (oxacillin) resistant coagulase negative staphylococcus. Possible blood culture contaminant (unless isolated from more than one blood culture draw or clinical case suggests pathogenicity). No antibiotic treatment is indicated for blood  culture contaminants. CRITICAL RESULT CALLED TO, READ BACK BY AND VERIFIED WITH: M LILLISTON,PHARMD@0623  02/09/23 MK    Staphylococcus lugdunensis NOT DETECTED NOT DETECTED Final   Streptococcus species NOT DETECTED NOT DETECTED Final   Streptococcus agalactiae NOT DETECTED NOT DETECTED Final   Streptococcus pneumoniae NOT DETECTED NOT DETECTED Final   Streptococcus pyogenes NOT DETECTED NOT DETECTED Final   Fannie Alomar.calcoaceticus-baumannii NOT DETECTED NOT DETECTED Final   Bacteroides fragilis NOT DETECTED NOT DETECTED Final   Enterobacterales NOT DETECTED NOT DETECTED Final    Enterobacter cloacae complex NOT DETECTED NOT DETECTED Final   Escherichia coli NOT DETECTED NOT DETECTED Final   Klebsiella aerogenes NOT DETECTED NOT DETECTED Final   Klebsiella oxytoca NOT DETECTED NOT DETECTED Final   Klebsiella pneumoniae NOT DETECTED NOT DETECTED Final   Proteus species NOT DETECTED NOT DETECTED Final   Salmonella species NOT DETECTED NOT DETECTED Final   Serratia marcescens NOT DETECTED NOT DETECTED Final   Haemophilus influenzae NOT DETECTED NOT DETECTED Final   Neisseria meningitidis NOT DETECTED NOT DETECTED Final   Pseudomonas aeruginosa NOT DETECTED NOT DETECTED Final   Stenotrophomonas maltophilia NOT DETECTED NOT DETECTED Final   Candida albicans NOT DETECTED NOT DETECTED Final   Candida auris NOT DETECTED NOT DETECTED Final   Candida glabrata NOT DETECTED NOT DETECTED Final   Candida krusei NOT DETECTED NOT DETECTED Final   Candida parapsilosis NOT DETECTED NOT DETECTED Final   Candida tropicalis NOT DETECTED NOT DETECTED Final   Cryptococcus neoformans/gattii NOT DETECTED NOT DETECTED Final   Methicillin resistance mecA/C DETECTED (Tripton Ned) NOT DETECTED Final    Comment: CRITICAL RESULT CALLED TO, READ BACK BY AND VERIFIED WITH: M LILLISTON,PHARMD@0623  02/09/23 MK Performed at [facility], [address] [place], [place] [address]   Urine Culture     Status: None   Collection Time: 02/07/23  8:42 AM   Specimen: Urine, Cystoscope  Result Value Ref Range Status   Specimen Description   Final    URINE, CLEAN CATCH Performed at [facility], [address] [place], [place] [address]    Special Requests   Final    URINE CYSTOSCOPY Performed at [facility], [address] [place], [place] [address]    Culture   Final    NO GROWTH Performed at [facility], [address] [place], [place] [address]    Report Status 02/08/2023 FINAL  Final         Radiology Studies: No results  found.       Scheduled Meds:  sodium chloride    Intravenous Once  allopurinol   50 mg Oral q morning   arformoterol   15 mcg Nebulization BID   budesonide  (PULMICORT ) nebulizer solution  0.5 mg Nebulization BID   Chlorhexidine  Gluconate Cloth  6 each Topical Daily   docusate  100 mg Oral Daily   feeding supplement  1 Bottle Oral TID BM   insulin  aspart  0-9 Units Subcutaneous Q4H   multivitamin with minerals  1 tablet Oral Daily   mouth rinse  15 mL Mouth Rinse 4 times per day   pantoprazole   40 mg Oral BID   sodium chloride  flush  10-40 mL Intracatheter Q12H   sodium chloride  flush  3-10 mL Intravenous Q12H   warfarin  5 mg Oral ONCE-1600   Warfarin - Pharmacist Dosing Inpatient   Does not apply q1600   Continuous Infusions:  sodium chloride  Stopped (02/07/23 1036)   dextrose  100 mL/hr at 02/16/23 1121   heparin  1,800 Units/hr (02/16/23 0612)   meropenem  (MERREM ) IV 1 g (02/16/23 0609)     LOS: 13 days    Time spent: over 30 min    Meliton Monte, MD [facility]   To contact the attending provider between 7A-7P or the covering provider during after hours 7P-7A, please log into the web site [email or web address] and access using universal [place] password for that web site. If you do not have the password, please call the hospital operator.  02/16/2023, 3:08 PM

## 2023-02-16 NOTE — Progress Notes (Signed)
 PHARMACY - ANTICOAGULATION CONSULT NOTE  Pharmacy Consult for Heparin  Indication:  bioprosthetic aortic valve, mitral valve repair, and Afib. -Acute ischemic stroke,   Allergies  Allergen Reactions   Sulfa Antibiotics Other (See Comments)    Granulocytosis   Sulfamethoxazole-Trimethoprim     Other Reaction(s): Unknown   Zestril  [Lisinopril ] Cough    Patient Measurements: Height: 6' (182.9 cm) Weight: 77.9 kg (171 lb 11.8 oz) IBW/kg (Calculated) : 77.6 Heparin  Dosing Weight: 77.9 kg  Vital Signs: Temp: 98.8 F (37.1 C) (02/08 1141) BP: 128/69 (02/08 1141) Pulse Rate: 52 (02/08 1141)  Labs: Recent Labs    02/14/23 0220 02/14/23 1431 02/14/23 2345 02/15/23 1021 02/15/23 2322 02/16/23 0453 02/16/23 1553  HGB 7.2*  --  7.0* 6.9*  --  8.3*  --   HCT 22.9*  --  22.3* 22.8*  --  26.5*  --   PLT 114*  --  137* 146*  --  163  --   LABPROT 17.8*  --  17.2*  --   --  16.5*  --   INR 1.4*  --  1.4*  --   --  1.3*  --   HEPARINUNFRC <0.10*   < > <0.10* 0.12* 0.30 0.22* 0.37  CREATININE 0.88   < > 0.90 0.95 0.99 0.93  --    < > = values in this interval not displayed.    Estimated Creatinine Clearance: 63.7 mL/min (by C-G formula based on SCr of 0.93 mg/dL).   Assessment: AC/Heme: On warfarin 5mg  daily except 7.5 mg on Mon PTA for bioprosthetic aortic valve, mitral valve repair, and Afib. -Acute ischemic stroke, MRI 2/1 w/ small left frontal and temporal white matter infarct  -Left iliopsoas IM bleed, renal cyst w/ hemorrhage, LUE hematoma -Heparin  resumed 2/5 - 2/7: Hgb 6.9, transfused. Resume warfarin - Hep level 0.22 low, INR 1.3, Hgb 6.9>>8.3. Plts up to 163 Repeat HL up to 0.37 now in goal.  Goal of Therapy:  Hep level 0.3-0.5 Monitor platelets by anticoagulation protocol: Yes   Plan:  lower goal, no bolus (Cautiously d/t recent stroke/bleeding) Con't IV heparin  at 1800 units/hr Coumadin  5mg  po x 1 today. Daily HL, CBC, and INR   Florinda Taflinger S. Casimir,  PharmD, BCPS Clinical Staff Pharmacist Casimir Salines Stillinger 02/16/2023,4:52 PM

## 2023-02-17 DIAGNOSIS — J9601 Acute respiratory failure with hypoxia: Secondary | ICD-10-CM | POA: Diagnosis not present

## 2023-02-17 LAB — MAGNESIUM: Magnesium: 1.9 mg/dL (ref 1.7–2.4)

## 2023-02-17 LAB — COMPREHENSIVE METABOLIC PANEL
ALT: 22 U/L (ref 0–44)
AST: 24 U/L (ref 15–41)
Albumin: 2.2 g/dL — ABNORMAL LOW (ref 3.5–5.0)
Alkaline Phosphatase: 92 U/L (ref 38–126)
Anion gap: 6 (ref 5–15)
BUN: 15 mg/dL (ref 8–23)
CO2: 22 mmol/L (ref 22–32)
Calcium: 8 mg/dL — ABNORMAL LOW (ref 8.9–10.3)
Chloride: 118 mmol/L — ABNORMAL HIGH (ref 98–111)
Creatinine, Ser: 0.75 mg/dL (ref 0.61–1.24)
GFR, Estimated: >60 mL/min (ref >60)
Glucose, Bld: 110 mg/dL — ABNORMAL HIGH (ref 70–99)
Potassium: 3.3 mmol/L — ABNORMAL LOW (ref 3.5–5.1)
Sodium: 146 mmol/L — ABNORMAL HIGH (ref 135–145)
Total Bilirubin: 1.2 mg/dL (ref 0.0–1.2)
Total Protein: 5.9 g/dL — ABNORMAL LOW (ref 6.5–8.1)

## 2023-02-17 LAB — CBC WITH DIFFERENTIAL/PLATELET
Abs Immature Granulocytes: 0.04 10*3/uL (ref 0.00–0.07)
Basophils Absolute: 0 10*3/uL (ref 0.0–0.1)
Basophils Relative: 0 %
Eosinophils Absolute: 0.2 10*3/uL (ref 0.0–0.5)
Eosinophils Relative: 2 %
HCT: 25.3 % — ABNORMAL LOW (ref 39.0–52.0)
Hemoglobin: 7.9 g/dL — ABNORMAL LOW (ref 13.0–17.0)
Immature Granulocytes: 1 %
Lymphocytes Relative: 22 %
Lymphs Abs: 1.6 10*3/uL (ref 0.7–4.0)
MCH: 29 pg (ref 26.0–34.0)
MCHC: 31.2 g/dL (ref 30.0–36.0)
MCV: 93 fL (ref 80.0–100.0)
Monocytes Absolute: 0.6 10*3/uL (ref 0.1–1.0)
Monocytes Relative: 8 %
Neutro Abs: 5 10*3/uL (ref 1.7–7.7)
Neutrophils Relative %: 67 %
Platelets: 189 10*3/uL (ref 150–400)
RBC: 2.72 MIL/uL — ABNORMAL LOW (ref 4.22–5.81)
RDW: 17.6 % — ABNORMAL HIGH (ref 11.5–15.5)
WBC: 7.4 10*3/uL (ref 4.0–10.5)
nRBC: 0 % (ref 0.0–0.2)

## 2023-02-17 LAB — PROTIME-INR
INR: 1.5 — ABNORMAL HIGH (ref 0.8–1.2)
Prothrombin Time: 18 s — ABNORMAL HIGH (ref 11.4–15.2)

## 2023-02-17 LAB — GLUCOSE, CAPILLARY
Glucose-Capillary: 100 mg/dL — ABNORMAL HIGH (ref 70–99)
Glucose-Capillary: 104 mg/dL — ABNORMAL HIGH (ref 70–99)
Glucose-Capillary: 109 mg/dL — ABNORMAL HIGH (ref 70–99)
Glucose-Capillary: 117 mg/dL — ABNORMAL HIGH (ref 70–99)
Glucose-Capillary: 92 mg/dL (ref 70–99)
Glucose-Capillary: 99 mg/dL (ref 70–99)

## 2023-02-17 LAB — HEPARIN LEVEL (UNFRACTIONATED)
Heparin Unfractionated: 0.23 [IU]/mL — ABNORMAL LOW (ref 0.30–0.70)
Heparin Unfractionated: 0.35 [IU]/mL (ref 0.30–0.70)

## 2023-02-17 LAB — PHOSPHORUS: Phosphorus: 2.7 mg/dL (ref 2.5–4.6)

## 2023-02-17 MED ORDER — WARFARIN SODIUM 5 MG PO TABS
5.0000 mg | ORAL_TABLET | Freq: Once | ORAL | Status: AC
  Administered 2023-02-17: 5 mg via ORAL
  Filled 2023-02-17: qty 1

## 2023-02-17 MED ORDER — POTASSIUM CHLORIDE CRYS ER 20 MEQ PO TBCR
40.0000 meq | EXTENDED_RELEASE_TABLET | ORAL | Status: AC
  Administered 2023-02-17 (×2): 40 meq via ORAL
  Filled 2023-02-17 (×2): qty 2

## 2023-02-17 NOTE — Progress Notes (Signed)
 PHARMACY - ANTICOAGULATION CONSULT NOTE  Pharmacy Consult for heparin  + warfarin Indication: afib, CVA, s/p mitral valve repair   Allergies  Allergen Reactions   Sulfa Antibiotics Other (See Comments)    Granulocytosis   Sulfamethoxazole-Trimethoprim     Other Reaction(s): Unknown   Zestril  [Lisinopril ] Cough    Patient Measurements: Height: 6' (182.9 cm) Weight: 77.9 kg (171 lb 11.8 oz) IBW/kg (Calculated) : 77.6 Heparin  Dosing Weight: n/a. Use total body weight 78 kg  Vital Signs: Temp: 99 F (37.2 C) (02/09 0421) Temp Source: Oral (02/09 0421) BP: 162/74 (02/09 0421) Pulse Rate: 72 (02/09 0421)  Labs: Recent Labs    02/14/23 2345 02/15/23 1021 02/15/23 2322 02/16/23 0453 02/16/23 1553 02/17/23 0428  HGB 7.0* 6.9*  --  8.3*  --  7.9*  HCT 22.3* 22.8*  --  26.5*  --  25.3*  PLT 137* 146*  --  163  --  189  LABPROT 17.2*  --   --  16.5*  --  18.0*  INR 1.4*  --   --  1.3*  --  1.5*  HEPARINUNFRC <0.10* 0.12* 0.30 0.22* 0.37 0.23*  CREATININE 0.90 0.95 0.99 0.93  --  0.75    Estimated Creatinine Clearance: 74.1 mL/min (by C-G formula based on SCr of 0.75 mg/dL).   Medical History: Past Medical History:  Diagnosis Date   Acquired dilation of ascending aorta and aortic root (HCC)    43mm aortic root and 41mm ascending aorta on echo 03/2020   Arthritis    Bladder stones    Borderline diabetes    BPH (benign prostatic hyperplasia)    CAD (coronary artery disease) 12/19/2020   Non-obstructive coronary artery disease at cath in 2019   Complication of anesthesia    problem with voiding after anesthesia,   Coronary artery disease    cardiologist-  dr lovette mar gerhart NP--- per cath 06-02-2010 non-obstructive cad pLAD 30-40%   CVA (cerebral vascular accident) (HCC) 10/24/2016   Diverticulosis of colon    Dysrhythmia    afib   Gout    Heart murmur    History of adenomatous polyp of colon    2002-- tubular adenoma   History of aortic insufficiency     severe -- s/p  AVR 08-03-2010   History of small bowel obstruction    02/ 2007 mechanical sbo s/p  surgical intervention;  partial sbo 09/ 2011 and 03-20-2011 resolved without surgical intervention   History of urinary retention    HTN (hypertension)    Other secondary pulmonary hypertension (HCC) 03/20/2022   TTE 03/19/22: EF 65-70, no RWMA, moderate LVH, normal RVSF, severe pulmonary hypertension (RVSP 60.4), severe BAE, normal structure and function of mitral valve repair (mean gradient 9), mild TR, trivial AI, normal structure and function of AVR (mean 15.4), moderate PI, aortic root and ascending aorta 40 mm, RAP 15   Peripheral neuropathy    Persistent atrial fibrillation (HCC)    Pre-diabetes    S/P aortic valve replacement with prosthetic valve 08/03/2010   tissue valve   S/P Maze operation for atrial fibrillation 01/30/2018   Complete bilateral atrial lesion set using bipolar radiofrequency and cryothermy with clipping of LA appendage   S/P MVR (mitral valve repair) 01/30/2018   Complex valvuloplasty including artificial Gore-tex neochord placement x4 and Carbo medics Annuloflex ring annuloplasty, size 28   S/P patent foramen ovale closure 08/03/2010   at same time AVR   S/P tricuspid valve repair 01/30/2018   Using an  MC3 Annuloplasty ring, size 28   Stroke (HCC)    Thrombocytopenia (HCC)    Tricuspid regurgitation     Medications:  -PTA warfarin regimen: 5 mg daily except for 7.5 mg on Mondays  -Last dose: 1/25  Assessment: [name] with hx afib, CVA and mitral valve repair on warfarin PTA who presented to the ED on 02/02/23 with SOB. Pt found to have enterobacter cloacae bacteremia and remains on meropenem .   One dose of warfarin was given on 1/25. Oral anticoagulation was then held due to N/V, SBO. Despite holding warfarin, INR remained therapeutic until 2/2.   During this admission, he was noted to have hemorrhage of a right kidney cyst and small  intramuscular bleed within the left iliopsoas per renal CT on 02/06/23, then underwent cystoscopy with stent placement on 02/07/23. Thrombocytopenia throughout admission, Plt as low as 23 on 1/30.  Pt suffered from acute ischemic stroke on 2/1. MRI brain on 2/2 reveals small acute left frontal and left temporal white matter infarcts. Neurology consulted, recommended to repeat head CT when ready to resume anticoagulation. CT Head on 2/5: No evidence of hemorrhagic conversion.   Pharmacy consulted to resume heparin  drip on 2/5 (instructions for no bolus, low goal), and warfarin on 2/7.   Today, 02/17/2023: INR = 1.5 is subtherapeutic Heparin  level 0.23 subtherapeutic on1800 units/hr CBC: Hgb (7.9) remains low, Plt trending up Confirmed with RN - no signs of bleeding  Goal of Therapy:  INR 2-3 Heparin  level 0.3-0.5 units/ml Monitor platelets by anticoagulation protocol: Yes   Plan:  increase heparin  infusion to 1950 units/hr  Heparin  level in 8 hours Warfarin 5mg  po x 1 today at 1600 CBC, heparin  level, INR daily Monitor for signs of bleeding  Leeroy Mace RPh 02/17/2023, 5:24 AM

## 2023-02-17 NOTE — Progress Notes (Signed)
 Physical Therapy Treatment Patient Details Name: [name]. MRN: [id number] DOB: 1935-06-20 [name] Date: 02/17/2023   History of Present Illness Patient is a 88 year old male who presented on 1/25 with dyspnea and hypoxia. Patient was admited with concerns of SBO. 1/27 patient pulled out NG tube with increased work of breath and fever. 1/29 patient on HFNC with CT revealing mild right hydronephrosis with 12 mm right proximal ureteral stone.1/31 patient was taken off vasopressors and received PRBCs. 2/1 patient found to have left side weakness with code stroke called. MRI revealed L MCA. PMH: R TKR, L THR, L TKA,h/o stroke, peripheral neuropathy, gout, HTN.    PT Comments  Pt agreeable to working with therapy. Min-Mod A for mobility on today. Pt participated well. He is motivated to regain his strength. Recommended he perform quad sets and glute sets while sitting up in recliner. Remains at risk for falls when mobilizing. Patient will benefit from continued inpatient follow up therapy, <3 hours/day.     If plan is discharge home, recommend the following: A little help with walking and/or transfers;A little help with bathing/dressing/bathroom;Assistance with cooking/housework;Assist for transportation;Help with stairs or ramp for entrance   Can travel by private vehicle        Equipment Recommendations  None recommended by PT    Recommendations for Other Services       Precautions / Restrictions Precautions Precautions: Fall Restrictions Weight Bearing Restrictions Per Provider Order: No     Mobility  Bed Mobility Overal bed mobility: Needs Assistance Bed Mobility: Supine to Sit     Supine to sit: HOB elevated, Contact guard, Used rails     General bed mobility comments: Increased time. Cues for safety    Transfers Overall transfer level: Needs assistance Equipment used: Rolling walker (2 wheels) Transfers: Sit to/from Stand Sit to Stand: Min assist, From elevated  surface           General transfer comment: Min A from elevated bed, Mod A from desk chair in hallway. Cues for safety, technique, hand placement. Increased time.    Ambulation/Gait Ambulation/Gait assistance: Min assist Gait Distance (Feet): 50 Feet (x2) Assistive device: Rolling walker (2 wheels) Gait Pattern/deviations: Decreased stride length, Decreased step length - left, Decreased step length - right       General Gait Details: Min A to steady throughout distance. Seated rest break taken after ~50 feet 2* fatigue. Cues for safety, RW proximity, and to increase step lengths.   Stairs             Wheelchair Mobility     Tilt Bed    Modified Rankin (Stroke Patients Only)       Balance Overall balance assessment: Needs assistance         Standing balance support: Bilateral upper extremity supported, During functional activity, Reliant on assistive device for balance Standing balance-Leahy Scale: Poor                              Cognition Arousal: Alert Behavior During Therapy: WFL for tasks assessed/performed Overall Cognitive Status: Within Functional Limits for tasks assessed                                          Exercises      General Comments  Pertinent Vitals/Pain Pain Assessment Pain Assessment: No/denies pain    Home Living                          Prior Function            PT Goals (current goals can now be found in the care plan section) Progress towards PT goals: Progressing toward goals    Frequency    Min 1X/week      PT Plan      Co-evaluation              AM-PAC PT 6 Clicks Mobility   Outcome Measure  Help needed turning from your back to your side while in a flat bed without using bedrails?: A Little Help needed moving from lying on your back to sitting on the side of a flat bed without using bedrails?: A Little Help needed moving to and from a bed to  a chair (including a wheelchair)?: A Little Help needed standing up from a chair using your arms (e.g., wheelchair or bedside chair)?: A Little Help needed to walk in hospital room?: A Little Help needed climbing 3-5 steps with a railing? : Total 6 Click Score: 16    End of Session Equipment Utilized During Treatment: Gait belt Activity Tolerance: Patient tolerated treatment well;Patient limited by fatigue Patient left: in chair;with call bell/phone within reach;with chair alarm set   PT Visit Diagnosis: Muscle weakness (generalized) (M62.81);History of falling (Z91.81);Difficulty in walking, not elsewhere classified (R26.2)     Time: 8556-8498 PT Time Calculation (min) (ACUTE ONLY): 18 min  Charges:    $Gait Training: 8-22 mins PT General Charges $$ ACUTE PT VISIT: 1 Visit                         Dannial SQUIBB, PT Acute Rehabilitation  Office: [phone]

## 2023-02-17 NOTE — Progress Notes (Addendum)
 PROGRESS NOTE    [name].  FMW:[id number] DOB: 1935-03-24 DOA: 02/02/2023 PCP: Katina Pfeiffer, PA-C  Chief Complaint  Patient presents with   Shortness of Breath    Brief Narrative:   [name]. is [name] 88 y.o. male who has an extensive PMH as below, significant for CAD, CVA, aortic valve replacement 2012, hypertension, pulmonary hypertension, atrial fibrillation and small bowel obstruction  . He presented to [facility] ED 1/25 with dyspnea that woke him from his sleep the night prior. He had also complained of LE edema over the preceding few weeks. On EMS arrival, he had sats of 85% on room air. He was placed on [place] O2 and given Solumedrol and DuoNebs in route to ED.   In ED, CXR was suggestive of mild edema. He was admitted by TRH and started on diuresis. He was seen by cardiology in consultation who agreed with diuresis for fluid overload and obtaining an echo. Echo was obtained and demonstrated EF 60-65%, mod LVH, RV pressure overload with RVSP 61, LA and RA severely dilated.   On evening of 1/26, he developed nausea and vomiting with probable aspiration event. There was concern for SBO and was seen by general surgery who recommended continuing NPO and continuing NGT to suction.   On 1/27, diuresis was held due to bump in renal function after IV lasix . He was only net negative since admit. He did not appear to have excess volume per cardiology notes and appeared improved from CHF standpoint. Dyspnea and hypoxia were felt to more likely be related to aspiration PNA.  General surgery had also seen and he had benign exam and was passing flatus. He unfortunately pulled out his NGT that day.   After pulling out NGT 1/27, he had episode of tachypnea and increased WOB along with spike in temp to 102.80F. Due to increased WOB, PCCM asked to see in consultation. He had been on 4L [place] prior to this episode.   Blood cultures from admission returned positive for Enterobacter cloacae.  He was on Cefepime  escalated to Meropenem .  Family report concerns of pt aspirating for over [name] week prior to admit. Further GOC clarified with pt's son, [name]/ HPOA, changed to DNR/ DNI.   CT scan on 1/29 showed mild right hydro with 12 mm right proximal ureteral stone.  Urology was c/s and he's now s/p cystoscopy with R ureteral stent placement.  CT also showed hemorrhage within the dominant 8.5 cm cyst in the inferior pole of the right kidney as well as [name] small intramuscular bleed within the left iliopsoas.   He was found to have new facial droop and L sided weakness and altered speech on 2/1.  MRI showed acute L frontal and L temporal white matter infarcts.  He was seen by neurology.   Anticoagulation was held.  [name] repeat head CT on 2/5 showed no evidence of hemorrhagic conversion.  Heparin  was restarted on 2/5 PM.   He currently remains on antibiotics for his enterococcus bacteremia.  We're watching cautiously with the reinitiation of anticoagulation.  He's been hypernatremia for the past several days which makes me concerned for his ability to adequately take PO.  Will continue to monitor.   Assessment & Plan:   Principal Problem:   Acute respiratory failure with hypoxia (HCC) Active Problems:   Essential hypertension   Permanent atrial fibrillation (HCC)   CAD (coronary artery disease)   s/p mitral valve repair   Anemia due to chronic blood  loss   Benign prostatic hyperplasia with lower urinary tract symptoms   Thrombocytopenia (HCC)   Gout   Hypokalemia   Left hemiparesis (HCC)   S/P AVR (aortic valve replacement)   History of ischemic right MCA stroke   Hyperglycemia   S/P tricuspid valve repair   S/P Maze operation for atrial fibrillation   Prediabetes   Protein-calorie malnutrition, moderate (HCC)   HLD (hyperlipidemia)   Other secondary pulmonary hypertension (HCC)   Elevated troponin   Volume overload   Aspiration pneumonia (HCC)   DNR (do not resuscitate)   Ureteral  calculus, right   Nephrolithiasis   Sepsis due to Enterobacter [facility])   Renal cyst  Goals of Care  Family Communication See prior notes.  Plan for full code.    Acute ischemic stroke: Thought to be related to FFP/platelet infusion MRI brain on 2/1 showed small acute left frontal and left temporal white matter infarct. MR angio showed severe stenosis of right P2 PCA Neurology follow up appreciated -> 02/10/2023 note Repeat head CT without evidence of hemorrhagic conversion  Seen by SLP- dysphagia 2 diet.   Acute hypoxic respiratory failure-multifactorial in the setting of aspiration pneumonia, fluid overload/CHF exacerbation: 02/09/2023: Off of oxygen. Continue aspiration precautions.   Continue pulmonary hygiene. Dysphagia diet.   Enterobacter Colace bacteremia in the setting of UTI  right ureteral stone with mild hydronephrosis: Septic shock-resolved 1/30 was not [name] candidate for percutaneous  nephrostomy tube given elevated INR and thrombocytopenia.  S/p cystoscopy with right ureteral stent placement 1/30 Off of pressors now.  Fever improved. WBC improved. Cefepime  changed to Meropenem  due to encephalopathy. Continue antibiotics for total 14 days (antibiotics now complete). He has PICC line placed 02/05/23 - this can come out when he's ready to d/c. 1 of 2 cultures from 1/29 with staph epidermidis, suspect contaminant   Left iliopsoas intramuscular bleed Right renal cyst with hemorrhage  LUE Ecchymoses Discussed renal cyst hemorrhage with urology, ok with resuming anticoagulation Will restart cautiously on heparin , low threshold to hold   Ileus  NGT removed 1/27, passing flatus Rectal tube is no longer in place   Hypernatremia: Hypotonic fluids.  Encourage free water .     AKI: Baseline creatinine 0.9.  Worsened after IV diuresis and shock.  Mild right hydronephrosis with right renal calculi.  Status post stent placement on 1/30 by urology.  Renal function improved.   Afib -  on coumadin  Valvular disease s/p bioprosthetic AVR 2012, w/ MV and TV repair  CAD, non-obstructive  HFpEF: Remain euvolemic.  Continue to hold on beta-blocker and Coumadin .  Cardiology signed off on 02/06/2023. INR goal is 2-3 (1.5 today).  INR subtherapeutic since 2/2.  heparin  gtt.  Will continue warfarin.   Anemia of chronic disease Acute on chronic Thrombocytopenia Coagulapathy on coumadin   S/p 2 units platelets, 1 unit FFP, 4 units pRBC's  Transfuse additional unit pRBC today With repeat head CT this AM without evidence of hemorrhagic conversion, continue heparin  gtt cautiously, will continue warfarin 2/7 Cardiology notably has recommended heparin  bridge for INR <2 Per oncology, anemia thought due to sepsis, bleeding, renal failure, and phlebotomy - Hb expected to improve over next several weeks will reach out to Dr. Cloretta tmrw regarding ? MDS   Moderate protein calorie malnutrition:  Dietitian on board Encourage oral diet, supplements.   Hyperglycemia- A1c 6.2 SSI     DVT prophylaxis: SCD Code Status: DNR Family Communication: noen Disposition:   Status is: Inpatient Remains inpatient appropriate because: need for  continued inpatient care   Consultants:  Cardiology s/o 02/06/2023 Urology Neurology Oncology surgery  Procedures:   02/07/2023 Cystoscopy right ureteral stent placement right retrograde pyelography with interpretation   Echo IMPRESSIONS     1. S/P MV repair with mean gradient 8 mmHg and no MR; s/p AVR with mean  gradient 14 mmHg and no AI; s/p TV repair with mild TR.   2. Left ventricular ejection fraction, by estimation, is 60 to 65%. The  left ventricle has normal function. The left ventricle has no regional  wall motion abnormalities. There is moderate left ventricular hypertrophy.  Left ventricular diastolic  parameters are indeterminate. There is the interventricular septum is  flattened in systole and diastole, consistent with right  ventricular  pressure and volume overload.   3. Right ventricular systolic function is normal. The right ventricular  size is mildly enlarged. There is severely elevated pulmonary artery  systolic pressure.   4. Left atrial size was severely dilated.   5. Right atrial size was severely dilated.   6. The mitral valve has been repaired/replaced. No evidence of mitral  valve regurgitation. No evidence of mitral stenosis. There is [name] prosthetic  annuloplasty ring present in the mitral position.   7. The tricuspid valve is has been repaired/replaced. The tricuspid valve  is status post repair with an annuloplasty ring.   8. The aortic valve has been repaired/replaced. Aortic valve  regurgitation is not visualized. No aortic stenosis is present. There is [name]  bioprosthetic valve present in the aortic position.   9. The inferior vena cava is dilated in size with <50% respiratory  variability, suggesting right atrial pressure of 15 mmHg.   Carotid US  Summary:  Right Carotid: Velocities in the right ICA are consistent with [name] 1-39%  stenosis.   Left Carotid: Velocities in the left ICA are consistent with [name] 1-39%  stenosis.   Vertebrals: Bilateral vertebral arteries demonstrate antegrade flow.  Subclavians: Normal flow hemodynamics were seen in bilateral subclavian               arteries.    Antimicrobials:  Anti-infectives (From admission, onward)    Start     Dose/Rate Route Frequency Ordered Stop   02/13/23 1130  meropenem  (MERREM ) 1 g in sodium chloride  0.9 % 100 mL IVPB        1 g 200 mL/hr over 30 Minutes Intravenous Every 8 hours 02/13/23 1043 02/17/23 2359   02/09/23 2200  meropenem  (MERREM ) 1 g in sodium chloride  0.9 % 100 mL IVPB  Status:  Discontinued        1 g 200 mL/hr over 30 Minutes Intravenous Every 12 hours 02/09/23 1402 02/13/23 1043   02/08/23 1100  ceFEPIme  (MAXIPIME ) 2 g in sodium chloride  0.9 % 100 mL IVPB  Status:  Discontinued        2 g 200 mL/hr over 30 Minutes  Intravenous Every 12 hours 02/08/23 1001 02/09/23 1401   02/06/23 1700  ceFEPIme  (MAXIPIME ) 2 g in sodium chloride  0.9 % 100 mL IVPB  Status:  Discontinued        2 g 200 mL/hr over 30 Minutes Intravenous Every 24 hours 02/06/23 0917 02/08/23 1001   02/05/23 1700  meropenem  (MERREM ) 500 mg in sodium chloride  0.9 % 100 mL IVPB  Status:  Discontinued        500 mg 200 mL/hr over 30 Minutes Intravenous Every 12 hours 02/05/23 0718 02/06/23 0908   02/04/23 1700  meropenem  (MERREM ) 1 g in  sodium chloride  0.9 % 100 mL IVPB  Status:  Discontinued        1 g 200 mL/hr over 30 Minutes Intravenous Every 12 hours 02/04/23 1457 02/05/23 0718   02/04/23 1200  Ampicillin -Sulbactam (UNASYN ) 3 g in sodium chloride  0.9 % 100 mL IVPB  Status:  Discontinued        3 g 200 mL/hr over 30 Minutes Intravenous Every 12 hours 02/04/23 0203 02/04/23 0944   02/04/23 1100  ceFEPIme  (MAXIPIME ) 2 g in sodium chloride  0.9 % 100 mL IVPB  Status:  Discontinued        2 g 200 mL/hr over 30 Minutes Intravenous Every 24 hours 02/04/23 0945 02/04/23 1456   02/03/23 1200  Ampicillin -Sulbactam (UNASYN ) 3 g in sodium chloride  0.9 % 100 mL IVPB  Status:  Discontinued        3 g 200 mL/hr over 30 Minutes Intravenous Every 6 hours 02/03/23 1115 02/04/23 0203       Subjective: No complaints  Objective: Vitals:   02/17/23 0421 02/17/23 0800 02/17/23 0803 02/17/23 1334  BP: (!) 162/74   (!) 148/75  Pulse: 72   77  Resp: 16   17  Temp: 99 F (37.2 C)   97.7 F (36.5 C)  TempSrc: Oral   Oral  SpO2: 97% 96% 96% 98%  Weight:      Height:        Intake/Output Summary (Last 24 hours) at 02/17/2023 1728 Last data filed at 02/17/2023 1041 Gross per 24 hour  Intake 2579.13 ml  Output 1175 ml  Net 1404.13 ml   Filed Weights   02/13/23 0418 02/14/23 0322 02/15/23 0417  Weight: 74.6 kg 75.3 kg 77.9 kg    Examination:  General: No acute distress. Cardiovascular: RRR Lungs: unlabored Abdomen: Soft, nontender,  nondistended Neurological: Alert and oriented 3. Moves all extremities 4. Cranial nerves II through XII grossly intact. Extremities: No clubbing or cyanosis. No edema  Data Reviewed: I have personally reviewed following labs and imaging studies  CBC: Recent Labs  Lab 02/14/23 0220 02/14/23 2345 02/15/23 1021 02/16/23 0453 02/16/23 2107 02/17/23 0428  WBC 10.6* 10.5 9.0 8.7  --  7.4  NEUTROABS 8.1*  --   --  6.2 5.6 5.0  HGB 7.2* 7.0* 6.9* 8.3*  --  7.9*  HCT 22.9* 22.3* 22.8* 26.5*  --  25.3*  MCV 92.7 92.5 93.4 93.0  --  93.0  PLT 114* 137* 146* 163  --  189    Basic Metabolic Panel: Recent Labs  Lab 02/11/23 0523 02/13/23 0015 02/14/23 0220 02/14/23 1820 02/14/23 2345 02/15/23 1021 02/15/23 2322 02/16/23 0453 02/17/23 0428  NA 141   < > 150*   < > 150* 149* 148* 148* 146*  K 3.0*   < > 3.3*   < > 3.1* 3.0* 3.8 3.6 3.3*  CL 112*   < > 122*   < > 122* 119* 121* 121* 118*  CO2 22   < > 22   < > 21* 22 20* 20* 22  GLUCOSE 526*   < > 146*   < > 119* 128* 108* 102* 110*  BUN 41*   < > 25*   < > 21 20 18 16 15   CREATININE 1.37*   < > 0.88   < > 0.90 0.95 0.99 0.93 0.75  CALCIUM  7.3*   < > 8.1*   < > 8.0* 8.1* 8.3* 8.5* 8.0*  MG 2.0  --  2.3  --   --   --   --  2.0 1.9  PHOS 3.4  --  2.7  --   --   --   --  2.4* 2.7   < > = values in this interval not displayed.    GFR: Estimated Creatinine Clearance: 74.1 mL/min (by C-G formula based on SCr of 0.75 mg/dL).  Liver Function Tests: Recent Labs  Lab 02/14/23 0220 02/16/23 0453 02/17/23 0428  AST 33 28 24  ALT 30 24 22   ALKPHOS 99 95 92  BILITOT 1.3* 1.3* 1.2  PROT 6.0* 6.2* 5.9*  ALBUMIN  2.1* 2.3* 2.2*    CBG: Recent Labs  Lab 02/17/23 0004 02/17/23 0428 02/17/23 0726 02/17/23 1122 02/17/23 1614  GLUCAP 100* 104* 92 99 117*     No results found for this or any previous visit (from the past 240 hours).        Radiology Studies: No results found.       Scheduled Meds:  sodium  chloride   Intravenous Once   allopurinol   50 mg Oral q morning   arformoterol   15 mcg Nebulization BID   budesonide  (PULMICORT ) nebulizer solution  0.5 mg Nebulization BID   Chlorhexidine  Gluconate Cloth  6 each Topical Daily   docusate  100 mg Oral Daily   feeding supplement  1 Bottle Oral TID BM   insulin  aspart  0-9 Units Subcutaneous Q4H   multivitamin with minerals  1 tablet Oral Daily   mouth rinse  15 mL Mouth Rinse 4 times per day   pantoprazole   40 mg Oral BID   potassium chloride   40 mEq Oral Q4H   sodium chloride  flush  10-40 mL Intracatheter Q12H   sodium chloride  flush  3-10 mL Intravenous Q12H   warfarin  5 mg Oral ONCE-1600   Warfarin - Pharmacist Dosing Inpatient   Does not apply q1600   Continuous Infusions:  sodium chloride  Stopped (02/07/23 1036)   dextrose  100 mL/hr at 02/17/23 1515   heparin  1,950 Units/hr (02/17/23 1504)   meropenem  (MERREM ) IV 1 g (02/17/23 1515)     LOS: 14 days    Time spent: over 30 min    Meliton Monte, MD [facility]   To contact the attending provider between 7A-7P or the covering provider during after hours 7P-7A, please log into the web site [email or web address] and access using universal [place] password for that web site. If you do not have the password, please call the hospital operator.  02/17/2023, 5:28 PM

## 2023-02-17 NOTE — Progress Notes (Signed)
 PHARMACY - ANTICOAGULATION CONSULT NOTE  Pharmacy Consult for Heparin  Indication:  bioprosthetic aortic valve, mitral valve repair, and Afib. -Acute ischemic stroke,   Allergies  Allergen Reactions   Sulfa Antibiotics Other (See Comments)    Granulocytosis   Sulfamethoxazole-Trimethoprim     Other Reaction(s): Unknown   Zestril  [Lisinopril ] Cough    Patient Measurements: Height: 6' (182.9 cm) Weight: 77.9 kg (171 lb 11.8 oz) IBW/kg (Calculated) : 77.6 Heparin  Dosing Weight: 77.9 kg  Vital Signs: Temp: 97.7 F (36.5 C) (02/09 1334) Temp Source: Oral (02/09 1334) BP: 148/75 (02/09 1334) Pulse Rate: 77 (02/09 1334)  Labs: Recent Labs    02/14/23 2345 02/15/23 1021 02/15/23 2322 02/16/23 0453 02/16/23 1553 02/17/23 0428 02/17/23 1353  HGB 7.0* 6.9*  --  8.3*  --  7.9*  --   HCT 22.3* 22.8*  --  26.5*  --  25.3*  --   PLT 137* 146*  --  163  --  189  --   LABPROT 17.2*  --   --  16.5*  --  18.0*  --   INR 1.4*  --   --  1.3*  --  1.5*  --   HEPARINUNFRC <0.10* 0.12* 0.30 0.22* 0.37 0.23* 0.35  CREATININE 0.90 0.95 0.99 0.93  --  0.75  --     Estimated Creatinine Clearance: 74.1 mL/min (by C-G formula based on SCr of 0.75 mg/dL).   Assessment:  AC/Heme: On warfarin 5mg  daily except 7.5 mg on Mon PTA for bioprosthetic aortic valve, mitral valve repair, and Afib. -Acute ischemic stroke, MRI 2/1 w/ small left frontal and temporal white matter infarct  -Left iliopsoas IM bleed, renal cyst w/ hemorrhage, LUE hematoma -Heparin  resumed 2/5 - 2/7: Hgb 6.9, transfused. Resume warfarin - HL 0.23 low>>0.35 back in goal range after adjustment, INR 1.5, Hgb 7.9, Plts 189 rising  Goal of Therapy:  Hep level 0.3-0.5 Monitor platelets by anticoagulation protocol: Yes   Plan:  lower goal, no bolus (Cautiously d/t recent stroke/bleeding) - IV heparin  at 1950 units/hr Coumadin  5mg  po x 1 today. Daily HL, CBC, and INR   [name], PharmD, BCPS Clinical Staff  Pharmacist Ayers [facility] [place] 02/17/2023,2:49 PM

## 2023-02-18 DIAGNOSIS — J9601 Acute respiratory failure with hypoxia: Secondary | ICD-10-CM | POA: Diagnosis not present

## 2023-02-18 LAB — CBC WITH DIFFERENTIAL/PLATELET
Abs Immature Granulocytes: 0.02 10*3/uL (ref 0.00–0.07)
Basophils Absolute: 0 10*3/uL (ref 0.0–0.1)
Basophils Relative: 0 %
Eosinophils Absolute: 0.2 10*3/uL (ref 0.0–0.5)
Eosinophils Relative: 3 %
HCT: 24.6 % — ABNORMAL LOW (ref 39.0–52.0)
Hemoglobin: 7.8 g/dL — ABNORMAL LOW (ref 13.0–17.0)
Immature Granulocytes: 0 %
Lymphocytes Relative: 23 %
Lymphs Abs: 1.6 10*3/uL (ref 0.7–4.0)
MCH: 29.4 pg (ref 26.0–34.0)
MCHC: 31.7 g/dL (ref 30.0–36.0)
MCV: 92.8 fL (ref 80.0–100.0)
Monocytes Absolute: 0.6 10*3/uL (ref 0.1–1.0)
Monocytes Relative: 9 %
Neutro Abs: 4.7 10*3/uL (ref 1.7–7.7)
Neutrophils Relative %: 65 %
Platelets: 197 10*3/uL (ref 150–400)
RBC: 2.65 MIL/uL — ABNORMAL LOW (ref 4.22–5.81)
RDW: 17.8 % — ABNORMAL HIGH (ref 11.5–15.5)
WBC: 7.1 10*3/uL (ref 4.0–10.5)
nRBC: 0 % (ref 0.0–0.2)

## 2023-02-18 LAB — MAGNESIUM: Magnesium: 1.9 mg/dL (ref 1.7–2.4)

## 2023-02-18 LAB — TYPE AND SCREEN
ABO/RH(D): O POS
Antibody Screen: NEGATIVE
Unit division: 0

## 2023-02-18 LAB — COMPREHENSIVE METABOLIC PANEL
ALT: 20 U/L (ref 0–44)
AST: 23 U/L (ref 15–41)
Albumin: 2 g/dL — ABNORMAL LOW (ref 3.5–5.0)
Alkaline Phosphatase: 81 U/L (ref 38–126)
Anion gap: 6 (ref 5–15)
BUN: 12 mg/dL (ref 8–23)
CO2: 22 mmol/L (ref 22–32)
Calcium: 7.8 mg/dL — ABNORMAL LOW (ref 8.9–10.3)
Chloride: 113 mmol/L — ABNORMAL HIGH (ref 98–111)
Creatinine, Ser: 0.75 mg/dL (ref 0.61–1.24)
GFR, Estimated: >60 mL/min (ref >60)
Glucose, Bld: 118 mg/dL — ABNORMAL HIGH (ref 70–99)
Potassium: 3.3 mmol/L — ABNORMAL LOW (ref 3.5–5.1)
Sodium: 141 mmol/L (ref 135–145)
Total Bilirubin: 1.1 mg/dL (ref 0.0–1.2)
Total Protein: 5.5 g/dL — ABNORMAL LOW (ref 6.5–8.1)

## 2023-02-18 LAB — GLUCOSE, CAPILLARY
Glucose-Capillary: 105 mg/dL — ABNORMAL HIGH (ref 70–99)
Glucose-Capillary: 110 mg/dL — ABNORMAL HIGH (ref 70–99)
Glucose-Capillary: 114 mg/dL — ABNORMAL HIGH (ref 70–99)
Glucose-Capillary: 135 mg/dL — ABNORMAL HIGH (ref 70–99)
Glucose-Capillary: 139 mg/dL — ABNORMAL HIGH (ref 70–99)
Glucose-Capillary: 91 mg/dL (ref 70–99)
Glucose-Capillary: 95 mg/dL (ref 70–99)

## 2023-02-18 LAB — BPAM RBC
Blood Product Expiration Date: 202503122359
ISSUE DATE / TIME: 202502071704
Unit Type and Rh: 5100

## 2023-02-18 LAB — PROTIME-INR
INR: 1.6 — ABNORMAL HIGH (ref 0.8–1.2)
Prothrombin Time: 19.7 s — ABNORMAL HIGH (ref 11.4–15.2)

## 2023-02-18 LAB — PHOSPHORUS: Phosphorus: 2.7 mg/dL (ref 2.5–4.6)

## 2023-02-18 LAB — HEPARIN LEVEL (UNFRACTIONATED): Heparin Unfractionated: 0.4 [IU]/mL (ref 0.30–0.70)

## 2023-02-18 MED ORDER — FINASTERIDE 5 MG PO TABS
5.0000 mg | ORAL_TABLET | Freq: Every day | ORAL | Status: DC
  Administered 2023-02-18 – 2023-02-22 (×5): 5 mg via ORAL
  Filled 2023-02-18 (×5): qty 1

## 2023-02-18 MED ORDER — BISACODYL 10 MG RE SUPP
10.0000 mg | Freq: Every day | RECTAL | Status: DC | PRN
  Administered 2023-02-18 – 2023-02-21 (×2): 10 mg via RECTAL
  Filled 2023-02-18 (×2): qty 1

## 2023-02-18 MED ORDER — TAMSULOSIN HCL 0.4 MG PO CAPS
0.4000 mg | ORAL_CAPSULE | Freq: Every day | ORAL | Status: DC
  Administered 2023-02-18 – 2023-02-21 (×4): 0.4 mg via ORAL
  Filled 2023-02-18 (×4): qty 1

## 2023-02-18 MED ORDER — WARFARIN SODIUM 5 MG PO TABS
7.5000 mg | ORAL_TABLET | Freq: Once | ORAL | Status: AC
  Administered 2023-02-18: 7.5 mg via ORAL
  Filled 2023-02-18: qty 1

## 2023-02-18 NOTE — Progress Notes (Signed)
 Nutrition Follow-up  DOCUMENTATION CODES:   Non-severe (moderate) malnutrition in context of chronic illness  INTERVENTION:   DYS 3 diet per SLP recs. - Ensure Plus High Protein po TID, each supplement provides 350       kcal and 20 grams of protein. - Continue Multivitamin with minerals daily - Monitor weight trends.    NUTRITION DIAGNOSIS:   Moderate Malnutrition related to chronic illness as evidenced by moderate fat depletion, severe muscle depletion.  Ongoing with intervention in place  GOAL:   Patient will meet greater than or equal to 90% of their needs    MONITOR:   PO intake, Supplement acceptance, Diet advancement, Weight trends  REASON FOR ASSESSMENT:   Consult Assessment of nutrition requirement/status  ASSESSMENT:   88 y.o. male who has an PMH significant for CAD, CVA, HTN, pulmonary hypertension, CHF, and small bowel obstruction who presented with dyspnea. Admitted for acute respiratory failure due to aspiration PNA and CHF exacerbation. Patient stated that appetite was getting better.  He said that he had scrambled eggs and grits for breakfast and is going to have the same for lunch because it set well on his stomach. He said he was drinking his Ensures. Patient stated he has not had a bowel movement he has bed pan at side with hopes to get things moving.  He is currently on bowel regimen.  RN reports he drinks 2 ensures a day. Stated No  BM, was able to get impaction out yesterday. Admit weight: 79.8 kg Hospital weight history: 02/18/23 0500 77.8 kg 171.52 lbs  02/15/23 0417 77.9 kg 171.74 lbs  02/14/23 0322 75.3 kg 166.01 lbs  02/13/23 04:18:13 74.6 kg 164.46 lbs  02/12/23 0701 82.2 kg 181.22 lbs  02/11/23 0500 82.2 kg 181.22 lbs  02/10/23 0701 77.7 kg 171.3 lbs  02/09/23 0500 77.7 kg 171.3 lbs  02/08/23 0500 79.6 kg 175.49 lbs  02/07/23 0500 73.8 kg 162.7 lbs  02/06/23 0500 72.8 kg 160.5 lbs  02/05/23 0500 75.1 kg 165.57 lbs  02/04/23 0438 76.1  kg 167.77 lbs  02/03/23 0500 78.6 kg 173.28 lbs  02/02/23 0758 79.8 kg 175.93 lbs    Average Meal Intake: 25-100: 75% intake x 8 recorded meals  Nutritionally Relevant Medications: Scheduled Meds:  allopurinol   50 mg Oral q morning   feeding supplement  1 Bottle Oral TID BM   multivitamin with minerals  1 tablet Oral Daily   warfarin  7.5 mg Oral ONCE-1600    Labs Reviewed     NUTRITION - FOCUSED PHYSICAL EXAM:  Flowsheet Row Most Recent Value  Orbital Region Mild depletion  Upper Arm Region Moderate depletion  Thoracic and Lumbar Region Mild depletion  Buccal Region Moderate depletion  Temple Region Moderate depletion  Clavicle Bone Region Severe depletion  Clavicle and Acromion Bone Region Severe depletion  Scapular Bone Region Unable to assess  Dorsal Hand Mild depletion  Patellar Region Moderate depletion  Anterior Thigh Region Moderate depletion  Posterior Calf Region Moderate depletion  Edema (RD Assessment) None  Hair Reviewed  Eyes Reviewed  Mouth Reviewed  Skin Reviewed  Nails Reviewed     Diet Order:   Diet Order             DIET DYS 3 Fluid consistency: Thin  Diet effective now                   EDUCATION NEEDS:   Education needs have been addressed  Skin:  Skin Assessment: Reviewed RN  Assessment  Last BM:  2/3  Height:   Ht Readings from Last 1 Encounters:  02/07/23 6' (1.829 m)    Weight:   Wt Readings from Last 1 Encounters:  02/18/23 77.8 kg    Ideal Body Weight:     BMI:  Body mass index is 23.26 kg/m.  Estimated Nutritional Needs:   Kcal:  1800-2050 kcals  Protein:  85-100 grams  Fluid:  >/= 1.8L    Gevena Kugel RDN, LDN Clinical Dietitian   If unable to reach, please contact "RD Inpatient" secure chat group between 8 am-4 pm daily"

## 2023-02-18 NOTE — Progress Notes (Signed)
 Mobility Specialist - Progress Note   02/18/23 1326  Mobility  Activity Transferred from chair to bed  Level of Assistance Minimal assist, patient does 75% or more  Assistive Device Front wheel walker  Distance Ambulated (ft) 5 ft  Range of Motion/Exercises Active  Activity Response Tolerated well  Mobility Referral Yes  Mobility visit 1 Mobility  Mobility Specialist Start Time (ACUTE ONLY) 1315  Mobility Specialist Stop Time (ACUTE ONLY) 1326  Mobility Specialist Time Calculation (min) (ACUTE ONLY) 11 min   Pt was found on recliner chair and requesting to transfer back to bed. Assisted pt back to bed and placed on bed pan at pt's request. Was left in bed with all needs met. Call bell in reach.  Lorna Rose Mobility Specialist

## 2023-02-18 NOTE — Progress Notes (Addendum)
 PROGRESS NOTE    [name].  WUJ:[id number] DOB: 1935-04-18 DOA: 02/02/2023 PCP: Gerald Elders, PA-C  Chief Complaint  Patient presents with   Shortness of Breath    Brief Narrative:   [name]. is [name] 88 y.o. male who has an extensive PMH as below, significant for CAD, CVA, aortic valve replacement 2012, hypertension, pulmonary hypertension, atrial fibrillation and small bowel obstruction  . He presented to [facility] ED 1/25 with dyspnea that woke him from his sleep the night prior. He had also complained of LE edema over the preceding few weeks. On EMS arrival, he had sats of 85% on room air. He was placed on [place] O2 and given Solumedrol and DuoNebs in route to ED.   In ED, CXR was suggestive of mild edema. He was admitted by TRH and started on diuresis. He was seen by cardiology in consultation who agreed with diuresis for fluid overload and obtaining an echo. Echo was obtained and demonstrated EF 60-65%, mod LVH, RV pressure overload with RVSP 61, LA and RA severely dilated.   On evening of 1/26, he developed nausea and vomiting with probable aspiration event. There was concern for SBO and was seen by general surgery who recommended continuing NPO and continuing NGT to suction.   On 1/27, diuresis was held due to bump in renal function after IV lasix . He was only net negative since admit. He did not appear to have excess volume per cardiology notes and appeared improved from CHF standpoint. Dyspnea and hypoxia were felt to more likely be related to aspiration PNA.  General surgery had also seen and he had benign exam and was passing flatus. He unfortunately pulled out his NGT that day.   After pulling out NGT 1/27, he had episode of tachypnea and increased WOB along with spike in temp to 102.30F. Due to increased WOB, PCCM asked to see in consultation. He had been on 4L [place] prior to this episode.   Blood cultures from admission returned positive for Enterobacter cloacae.  He was on Cefepime  escalated to Meropenem .  Family report concerns of pt aspirating for over [name] week prior to admit. Further GOC clarified with pt's son, [name]/ HPOA, changed to DNR/ DNI.   CT scan on 1/29 showed mild right hydro with 12 mm right proximal ureteral stone.  Urology was c/s and he's now s/p cystoscopy with R ureteral stent placement.  CT also showed hemorrhage within the dominant 8.5 cm cyst in the inferior pole of the right kidney as well as [name] small intramuscular bleed within the left iliopsoas.   He was found to have new facial droop and L sided weakness and altered speech on 2/1.  MRI showed acute L frontal and L temporal white matter infarcts.  He was seen by neurology.   Anticoagulation was held.  Faige Seely repeat head CT on 2/5 showed no evidence of hemorrhagic conversion.  Heparin  was restarted on 2/5 PM.   He currently remains on antibiotics for his enterococcus bacteremia.  We're watching cautiously with the reinitiation of anticoagulation.  He's been hypernatremia for the past several days which makes me concerned for his ability to adequately take PO.  Will continue to monitor.   Assessment & Plan:   Principal Problem:   Acute respiratory failure with hypoxia (HCC) Active Problems:   Essential hypertension   Permanent atrial fibrillation (HCC)   CAD (coronary artery disease)   s/p mitral valve repair   Anemia due to chronic blood  loss   Benign prostatic hyperplasia with lower urinary tract symptoms   Thrombocytopenia (HCC)   Gout   Hypokalemia   Left hemiparesis (HCC)   S/P AVR (aortic valve replacement)   History of ischemic right MCA stroke   Hyperglycemia   S/P tricuspid valve repair   S/P Maze operation for atrial fibrillation   Prediabetes   Protein-calorie malnutrition, moderate (HCC)   HLD (hyperlipidemia)   Other secondary pulmonary hypertension (HCC)   Elevated troponin   Volume overload   Aspiration pneumonia (HCC)   DNR (do not resuscitate)   Ureteral  calculus, right   Nephrolithiasis   Sepsis due to Enterobacter [facility])   Renal cyst  Goals of Care  Family Communication See prior notes.  Plan for full code.    Acute ischemic stroke: Thought to be related to FFP/platelet infusion MRI brain on 2/1 showed small acute left frontal and left temporal white matter infarct. MR angio showed severe stenosis of right P2 PCA Neurology follow up appreciated -> 02/10/2023 note Repeat head CT without evidence of hemorrhagic conversion  Seen by SLP- dysphagia 2 diet.   Acute hypoxic respiratory failure-multifactorial in the setting of aspiration pneumonia, fluid overload/CHF exacerbation: 02/09/2023: Off of oxygen. Continue aspiration precautions.   Continue pulmonary hygiene. Dysphagia diet.   Enterobacter Colace bacteremia in the setting of UTI  right ureteral stone with mild hydronephrosis: Septic shock-resolved 1/30 was not Sutton Hirsch candidate for percutaneous  nephrostomy tube given elevated INR and thrombocytopenia.  S/p cystoscopy with right ureteral stent placement 1/30 Off of pressors now.  Fever improved. WBC improved. Cefepime  changed to Meropenem  due to encephalopathy. Continue antibiotics for total 14 days (antibiotics now complete). He has PICC line placed 02/05/23 - this can come out when he's ready to d/c. 1 of 2 cultures from 1/29 with staph epidermidis, suspect contaminant   Left iliopsoas intramuscular bleed Right renal cyst with hemorrhage  LUE Ecchymoses Discussed renal cyst hemorrhage with urology, ok with resuming anticoagulation Will restart cautiously on heparin , low threshold to hold   Ileus  NGT removed 1/27, passing flatus Rectal tube is no longer in place   Hypernatremia: Encourage free water .  Improved today, will follow.   AKI: Baseline creatinine 0.9.  Worsened after IV diuresis and shock.  Mild right hydronephrosis with right renal calculi.  Status post stent placement on 1/30 by urology.  Renal function improved.    Afib - on coumadin  Valvular disease s/p bioprosthetic AVR 2012, w/ MV and TV repair  CAD, non-obstructive  HFpEF: Remain euvolemic.  Continue to hold on beta-blocker and Coumadin .  Cardiology signed off on 02/06/2023. INR goal is 2-3 (1.6 today).  INR subtherapeutic since 2/2.  heparin  gtt.  Will continue warfarin.  Hematuria noted today, continue anticoagulation cautiously.     Anemia of chronic disease Acute on chronic Thrombocytopenia Coagulapathy on coumadin   S/p 2 units platelets, 1 unit FFP, 4 units pRBC's  Transfuse additional unit pRBC today With repeat head CT this AM without evidence of hemorrhagic conversion, continue heparin  gtt cautiously, will continue warfarin 2/7 Cardiology notably has recommended heparin  bridge for INR <2 Per oncology, anemia thought due to sepsis, bleeding, renal failure, and phlebotomy - Hb expected to improve over next several weeks [name] -> planning for outpatient follow up, he notes it'll take weeks for Hb to recover   Moderate protein calorie malnutrition:  Dietitian on board Encourage oral diet, supplements.   Hyperglycemia- A1c 6.2 SSI     DVT prophylaxis: SCD Code Status:  DNR Family Communication: noen Disposition:   Status is: Inpatient Remains inpatient appropriate because: need for continued inpatient care   Consultants:  Cardiology s/o 02/06/2023 Urology Neurology Oncology surgery  Procedures:   02/07/2023 Cystoscopy right ureteral stent placement right retrograde pyelography with interpretation   Echo IMPRESSIONS     1. S/P MV repair with mean gradient 8 mmHg and no MR; s/p AVR with mean  gradient 14 mmHg and no AI; s/p TV repair with mild TR.   2. Left ventricular ejection fraction, by estimation, is 60 to 65%. The  left ventricle has normal function. The left ventricle has no regional  wall motion abnormalities. There is moderate left ventricular hypertrophy.  Left ventricular diastolic  parameters are  indeterminate. There is the interventricular septum is  flattened in systole and diastole, consistent with right ventricular  pressure and volume overload.   3. Right ventricular systolic function is normal. The right ventricular  size is mildly enlarged. There is severely elevated pulmonary artery  systolic pressure.   4. Left atrial size was severely dilated.   5. Right atrial size was severely dilated.   6. The mitral valve has been repaired/replaced. No evidence of mitral  valve regurgitation. No evidence of mitral stenosis. There is Darrek Leasure prosthetic  annuloplasty ring present in the mitral position.   7. The tricuspid valve is has been repaired/replaced. The tricuspid valve  is status post repair with an annuloplasty ring.   8. The aortic valve has been repaired/replaced. Aortic valve  regurgitation is not visualized. No aortic stenosis is present. There is Lucylle Foulkes  bioprosthetic valve present in the aortic position.   9. The inferior vena cava is dilated in size with <50% respiratory  variability, suggesting right atrial pressure of 15 mmHg.   Carotid US  Summary:  Right Carotid: Velocities in the right ICA are consistent with Kathelyn Gombos 1-39%  stenosis.   Left Carotid: Velocities in the left ICA are consistent with Andie Mortimer 1-39%  stenosis.   Vertebrals: Bilateral vertebral arteries demonstrate antegrade flow.  Subclavians: Normal flow hemodynamics were seen in bilateral subclavian               arteries.    Antimicrobials:  Anti-infectives (From admission, onward)    Start     Dose/Rate Route Frequency Ordered Stop   02/13/23 1130  meropenem  (MERREM ) 1 g in sodium chloride  0.9 % 100 mL IVPB        1 g 200 mL/hr over 30 Minutes Intravenous Every 8 hours 02/13/23 1043 02/17/23 2154   02/09/23 2200  meropenem  (MERREM ) 1 g in sodium chloride  0.9 % 100 mL IVPB  Status:  Discontinued        1 g 200 mL/hr over 30 Minutes Intravenous Every 12 hours 02/09/23 1402 02/13/23 1043   02/08/23 1100  ceFEPIme   (MAXIPIME ) 2 g in sodium chloride  0.9 % 100 mL IVPB  Status:  Discontinued        2 g 200 mL/hr over 30 Minutes Intravenous Every 12 hours 02/08/23 1001 02/09/23 1401   02/06/23 1700  ceFEPIme  (MAXIPIME ) 2 g in sodium chloride  0.9 % 100 mL IVPB  Status:  Discontinued        2 g 200 mL/hr over 30 Minutes Intravenous Every 24 hours 02/06/23 0917 02/08/23 1001   02/05/23 1700  meropenem  (MERREM ) 500 mg in sodium chloride  0.9 % 100 mL IVPB  Status:  Discontinued        500 mg 200 mL/hr over 30 Minutes Intravenous Every  12 hours 02/05/23 0718 02/06/23 0908   02/04/23 1700  meropenem  (MERREM ) 1 g in sodium chloride  0.9 % 100 mL IVPB  Status:  Discontinued        1 g 200 mL/hr over 30 Minutes Intravenous Every 12 hours 02/04/23 1457 02/05/23 0718   02/04/23 1200  Ampicillin -Sulbactam (UNASYN ) 3 g in sodium chloride  0.9 % 100 mL IVPB  Status:  Discontinued        3 g 200 mL/hr over 30 Minutes Intravenous Every 12 hours 02/04/23 0203 02/04/23 0944   02/04/23 1100  ceFEPIme  (MAXIPIME ) 2 g in sodium chloride  0.9 % 100 mL IVPB  Status:  Discontinued        2 g 200 mL/hr over 30 Minutes Intravenous Every 24 hours 02/04/23 0945 02/04/23 1456   02/03/23 1200  Ampicillin -Sulbactam (UNASYN ) 3 g in sodium chloride  0.9 % 100 mL IVPB  Status:  Discontinued        3 g 200 mL/hr over 30 Minutes Intravenous Every 6 hours 02/03/23 1115 02/04/23 0203       Subjective: No new complaints  Objective: Vitals:   02/18/23 0401 02/18/23 0500 02/18/23 0958 02/18/23 1000  BP: 128/69     Pulse: 72     Resp:      Temp: 98.6 F (37 C)     TempSrc: Oral     SpO2: 98%  97% 97%  Weight:  77.8 kg    Height:        Intake/Output Summary (Last 24 hours) at 02/18/2023 1757 Last data filed at 02/18/2023 1100 Gross per 24 hour  Intake 2108.18 ml  Output 1550 ml  Net 558.18 ml   Filed Weights   02/14/23 0322 02/15/23 0417 02/18/23 0500  Weight: 75.3 kg 77.9 kg 77.8 kg    Examination:  General: No acute  distress. Cardiovascular: RRR Lungs: unlabored Abdomen: Soft, nontender, nondistended Neurological: Alert and oriented 3. Moves all extremities 4 with equal strength. Cranial nerves II through XII grossly intact. Extremities: No clubbing or cyanosis. No edema.  Data Reviewed: I have personally reviewed following labs and imaging studies  CBC: Recent Labs  Lab 02/14/23 0220 02/14/23 2345 02/15/23 1021 02/16/23 0453 02/16/23 2107 02/17/23 0428 02/18/23 0353  WBC 10.6* 10.5 9.0 8.7  --  7.4 7.1  NEUTROABS 8.1*  --   --  6.2 5.6 5.0 4.7  HGB 7.2* 7.0* 6.9* 8.3*  --  7.9* 7.8*  HCT 22.9* 22.3* 22.8* 26.5*  --  25.3* 24.6*  MCV 92.7 92.5 93.4 93.0  --  93.0 92.8  PLT 114* 137* 146* 163  --  189 197    Basic Metabolic Panel: Recent Labs  Lab 02/14/23 0220 02/14/23 1820 02/15/23 1021 02/15/23 2322 02/16/23 0453 02/17/23 0428 02/18/23 0353  NA 150*   < > 149* 148* 148* 146* 141  K 3.3*   < > 3.0* 3.8 3.6 3.3* 3.3*  CL 122*   < > 119* 121* 121* 118* 113*  CO2 22   < > 22 20* 20* 22 22  GLUCOSE 146*   < > 128* 108* 102* 110* 118*  BUN 25*   < > 20 18 16 15 12   CREATININE 0.88   < > 0.95 0.99 0.93 0.75 0.75  CALCIUM  8.1*   < > 8.1* 8.3* 8.5* 8.0* 7.8*  MG 2.3  --   --   --  2.0 1.9 1.9  PHOS 2.7  --   --   --  2.4* 2.7 2.7   < > =  values in this interval not displayed.    GFR: Estimated Creatinine Clearance: 74.1 mL/min (by C-G formula based on SCr of 0.75 mg/dL).  Liver Function Tests: Recent Labs  Lab 02/14/23 0220 02/16/23 0453 02/17/23 0428 02/18/23 0353  AST 33 28 24 23   ALT 30 24 22 20   ALKPHOS 99 95 92 81  BILITOT 1.3* 1.3* 1.2 1.1  PROT 6.0* 6.2* 5.9* 5.5*  ALBUMIN  2.1* 2.3* 2.2* 2.0*    CBG: Recent Labs  Lab 02/18/23 0008 02/18/23 0356 02/18/23 0739 02/18/23 1128 02/18/23 1604  GLUCAP 139* 105* 110* 91 95     No results found for this or any previous visit (from the past 240 hours).        Radiology Studies: No results  found.       Scheduled Meds:  sodium chloride    Intravenous Once   allopurinol   50 mg Oral q morning   arformoterol   15 mcg Nebulization BID   budesonide  (PULMICORT ) nebulizer solution  0.5 mg Nebulization BID   Chlorhexidine  Gluconate Cloth  6 each Topical Daily   docusate  100 mg Oral Daily   feeding supplement  1 Bottle Oral TID BM   insulin  aspart  0-9 Units Subcutaneous Q4H   multivitamin with minerals  1 tablet Oral Daily   mouth rinse  15 mL Mouth Rinse 4 times per day   pantoprazole   40 mg Oral BID   sodium chloride  flush  10-40 mL Intracatheter Q12H   sodium chloride  flush  3-10 mL Intravenous Q12H   Warfarin - Pharmacist Dosing Inpatient   Does not apply q1600   Continuous Infusions:  sodium chloride  Stopped (02/07/23 1036)   heparin  1,950 Units/hr (02/18/23 0515)     LOS: 15 days    Time spent: over 30 min    Donnetta Gains, MD [facility]   To contact the attending provider between 7A-7P or the covering provider during after hours 7P-7A, please log into the web site [email or web address] and access using universal [place] password for that web site. If you do not have the password, please call the hospital operator.  02/18/2023, 5:57 PM

## 2023-02-18 NOTE — Progress Notes (Signed)
 PHARMACY - ANTICOAGULATION CONSULT NOTE  Pharmacy Consult for Heparin  Indication:  bioprosthetic aortic valve, mitral valve repair, and Afib. -Acute ischemic stroke,   Allergies  Allergen Reactions   Sulfa Antibiotics Other (See Comments)    Granulocytosis   Sulfamethoxazole-Trimethoprim     Other Reaction(s): Unknown   Zestril  [Lisinopril ] Cough    Patient Measurements: Height: 6' (182.9 cm) Weight: 77.8 kg (171 lb 8.3 oz) IBW/kg (Calculated) : 77.6 Heparin  Dosing Weight: 77.9 kg  Vital Signs: Temp: 98.6 F (37 C) (02/10 0401) Temp Source: Oral (02/10 0401) BP: 128/69 (02/10 0401) Pulse Rate: 72 (02/10 0401)  Labs: Recent Labs    02/16/23 0453 02/16/23 1553 02/17/23 0428 02/17/23 1353 02/18/23 0353  HGB 8.3*  --  7.9*  --  7.8*  HCT 26.5*  --  25.3*  --  24.6*  PLT 163  --  189  --  197  LABPROT 16.5*  --  18.0*  --  19.7*  INR 1.3*  --  1.5*  --  1.6*  HEPARINUNFRC 0.22*   < > 0.23* 0.35 0.40  CREATININE 0.93  --  0.75  --  0.75   < > = values in this interval not displayed.    Estimated Creatinine Clearance: 74.1 mL/min (by C-G formula based on SCr of 0.75 mg/dL).   Assessment:  AC/Heme: On warfarin 5mg  daily except 7.5 mg on Mon PTA for bioprosthetic aortic valve, mitral valve repair, and Afib. -Acute ischemic stroke, MRI 2/1 w/ small left frontal and temporal white matter infarct  -Left iliopsoas IM bleed, renal cyst w/ hemorrhage, LUE hematoma - Cardiology recommended heparin  bridge with warfarin while INR <2 (see 1/29 cardiology note) -Heparin  resumed 2/5 - 2/7: Hgb 6.9, transfused. Resume warfarin - 2/10: HL therapeutic at 0.4, INR low but improving at 1.6, Hgb slowly down-trending at 7.9, Plts improved to 197  Goal of Therapy:  Hep level 0.3-0.5 Monitor platelets by anticoagulation protocol: Yes   Plan:  - lower goal, no bolus (Cautiously d/t recent stroke/bleeding) - Continue IV heparin  at 1950 units/hr - Coumadin  7.5 mg po x 1 today -  Daily HL, CBC, and INR   Roselee Cong, PharmD Clinical Pharmacist  2/10/20257:11 AM

## 2023-02-18 NOTE — Plan of Care (Signed)
  Problem: Education: Goal: Ability to demonstrate management of disease process will improve Outcome: Progressing Goal: Ability to verbalize understanding of medication therapies will improve Outcome: Progressing   Problem: Activity: Goal: Capacity to carry out activities will improve Outcome: Progressing   Problem: Cardiac: Goal: Ability to achieve and maintain adequate cardiopulmonary perfusion will improve Outcome: Progressing   Problem: Education: Goal: Knowledge of General Education information will improve Description: Including pain rating scale, medication(s)/side effects and non-pharmacologic comfort measures Outcome: Progressing   Problem: Health Behavior/Discharge Planning: Goal: Ability to manage health-related needs will improve Outcome: Progressing   Problem: Clinical Measurements: Goal: Ability to maintain clinical measurements within normal limits will improve Outcome: Progressing Goal: Will remain free from infection Outcome: Progressing Goal: Diagnostic test results will improve Outcome: Progressing Goal: Respiratory complications will improve Outcome: Progressing Goal: Cardiovascular complication will be avoided Outcome: Progressing   Problem: Activity: Goal: Risk for activity intolerance will decrease Outcome: Progressing   Problem: Nutrition: Goal: Adequate nutrition will be maintained Outcome: Progressing   Problem: Coping: Goal: Level of anxiety will decrease Outcome: Progressing   Problem: Elimination: Goal: Will not experience complications related to bowel motility Outcome: Progressing Goal: Will not experience complications related to urinary retention Outcome: Progressing   Problem: Pain Managment: Goal: General experience of comfort will improve and/or be controlled Outcome: Progressing   Problem: Safety: Goal: Ability to remain free from injury will improve Outcome: Progressing   Problem: Skin Integrity: Goal: Risk for  impaired skin integrity will decrease Outcome: Progressing   Problem: Education: Goal: Knowledge of disease or condition will improve Outcome: Progressing Goal: Knowledge of secondary prevention will improve (MUST DOCUMENT ALL) Outcome: Progressing Goal: Knowledge of patient specific risk factors will improve (DELETE if not current risk factor) Outcome: Progressing   Problem: Ischemic Stroke/TIA Tissue Perfusion: Goal: Complications of ischemic stroke/TIA will be minimized Outcome: Progressing   Problem: Coping: Goal: Will verbalize positive feelings about self Outcome: Progressing Goal: Will identify appropriate support needs Outcome: Progressing   Problem: Health Behavior/Discharge Planning: Goal: Ability to manage health-related needs will improve Outcome: Progressing Goal: Goals will be collaboratively established with patient/family Outcome: Progressing   Problem: Self-Care: Goal: Ability to participate in self-care as condition permits will improve Outcome: Progressing Goal: Verbalization of feelings and concerns over difficulty with self-care will improve Outcome: Progressing Goal: Ability to communicate needs accurately will improve Outcome: Progressing   Problem: Nutrition: Goal: Risk of aspiration will decrease Outcome: Progressing Goal: Dietary intake will improve Outcome: Progressing

## 2023-02-18 NOTE — TOC Progression Note (Signed)
 Transition of Care (TOC) - Progression Note    Patient Details  Name: [name]. MRN: [id number] Date of Birth: [birth year]  Transition of Care [facility]) CM/SW Contact  Gertha Ku, LCSW Phone Number: 02/18/2023, 9:32 AM  Clinical Narrative:    CSW attempted to contact the [facility] representative to follow up about home health, but there was no answer. A voicemail was left requesting a return call. Still waiting to hear whether they can accept the pt or not.TOC to follow.    Expected Discharge Plan: Home w Home Health Services Barriers to Discharge: Continued Medical Work up  Expected Discharge Plan and Services In-house Referral: Clinical Social Work     Living arrangements for the past 2 months: Single Family Home                                       Social Determinants of Health (SDOH) Interventions SDOH Screenings   Food Insecurity: No Food Insecurity (02/03/2023)  Housing: Low Risk  (02/03/2023)  Transportation Needs: No Transportation Needs (02/03/2023)  Utilities: Not At Risk (02/03/2023)  Alcohol  Screen: Low Risk  (06/02/2021)  Depression (PHQ2-9): Low Risk  (06/16/2021)  Financial Resource Strain: Low Risk  (06/16/2021)  Social Connections: Unknown (02/03/2023)  Stress: No Stress Concern Present (06/16/2021)  Tobacco Use: Low Risk  (02/07/2023)    Readmission Risk Interventions    02/11/2023    4:00 PM 11/10/2021    3:54 PM  Readmission Risk Prevention Plan  Transportation Screening Complete Complete  PCP or Specialist Appt within 3-5 Days Not Complete Complete  Not Complete comments Patient will discharge to SNF.   HRI or Home Care Consult Complete Complete  Social Work Consult for Recovery Care Planning/Counseling Complete Complete  Palliative Care Screening Not Applicable Not Applicable  Medication Review Oceanographer) Complete Complete

## 2023-02-18 NOTE — Progress Notes (Signed)
 Mobility Specialist - Progress Note   02/18/23 1120  Mobility  Activity Ambulated with assistance in hallway  Level of Assistance Minimal assist, patient does 75% or more  Assistive Device Front wheel walker  Distance Ambulated (ft) 120 ft  Range of Motion/Exercises Active  Activity Response Tolerated well  Mobility Referral Yes  Mobility visit 1 Mobility  Mobility Specialist Start Time (ACUTE ONLY) 1045  Mobility Specialist Stop Time (ACUTE ONLY) 1106  Mobility Specialist Time Calculation (min) (ACUTE ONLY) 21 min   Pt was found in bed and agreeable to ambulate. Had x2 seated rest breaks during session. At EOS returned to bed with all needs met. Call bell in reach.  Lorna Rose Mobility Specialist

## 2023-02-19 ENCOUNTER — Other Ambulatory Visit: Payer: Self-pay | Admitting: Urology

## 2023-02-19 DIAGNOSIS — J9601 Acute respiratory failure with hypoxia: Secondary | ICD-10-CM | POA: Diagnosis not present

## 2023-02-19 LAB — GLUCOSE, CAPILLARY
Glucose-Capillary: 94 mg/dL (ref 70–99)
Glucose-Capillary: 101 mg/dL — ABNORMAL HIGH (ref 70–99)
Glucose-Capillary: 119 mg/dL — ABNORMAL HIGH (ref 70–99)
Glucose-Capillary: 99 mg/dL (ref 70–99)
Glucose-Capillary: 138 mg/dL — ABNORMAL HIGH (ref 70–99)

## 2023-02-19 LAB — PROTIME-INR
INR: 2 — ABNORMAL HIGH (ref 0.8–1.2)
Prothrombin Time: 22.6 s — ABNORMAL HIGH (ref 11.4–15.2)

## 2023-02-19 LAB — COMPREHENSIVE METABOLIC PANEL
ALT: 19 U/L (ref 0–44)
AST: 22 U/L (ref 15–41)
Albumin: 2.2 g/dL — ABNORMAL LOW (ref 3.5–5.0)
Alkaline Phosphatase: 82 U/L (ref 38–126)
Anion gap: 8 (ref 5–15)
BUN: 15 mg/dL (ref 8–23)
CO2: 22 mmol/L (ref 22–32)
Calcium: 7.9 mg/dL — ABNORMAL LOW (ref 8.9–10.3)
Chloride: 110 mmol/L (ref 98–111)
Creatinine, Ser: 0.71 mg/dL (ref 0.61–1.24)
GFR, Estimated: >60 mL/min (ref >60)
Glucose, Bld: 104 mg/dL — ABNORMAL HIGH (ref 70–99)
Potassium: 3.5 mmol/L (ref 3.5–5.1)
Sodium: 140 mmol/L (ref 135–145)
Total Bilirubin: 1.1 mg/dL (ref 0.0–1.2)
Total Protein: 5.8 g/dL — ABNORMAL LOW (ref 6.5–8.1)

## 2023-02-19 LAB — CBC WITH DIFFERENTIAL/PLATELET
Abs Immature Granulocytes: 0.01 10*3/uL (ref 0.00–0.07)
Basophils Absolute: 0 10*3/uL (ref 0.0–0.1)
Basophils Relative: 0 %
Eosinophils Absolute: 0.2 10*3/uL (ref 0.0–0.5)
Eosinophils Relative: 3 %
HCT: 24.8 % — ABNORMAL LOW (ref 39.0–52.0)
Hemoglobin: 7.7 g/dL — ABNORMAL LOW (ref 13.0–17.0)
Immature Granulocytes: 0 %
Lymphocytes Relative: 22 %
Lymphs Abs: 1.5 10*3/uL (ref 0.7–4.0)
MCH: 28.8 pg (ref 26.0–34.0)
MCHC: 31 g/dL (ref 30.0–36.0)
MCV: 92.9 fL (ref 80.0–100.0)
Monocytes Absolute: 0.7 10*3/uL (ref 0.1–1.0)
Monocytes Relative: 10 %
Neutro Abs: 4.2 10*3/uL (ref 1.7–7.7)
Neutrophils Relative %: 65 %
Platelets: 223 10*3/uL (ref 150–400)
RBC: 2.67 MIL/uL — ABNORMAL LOW (ref 4.22–5.81)
RDW: 17.6 % — ABNORMAL HIGH (ref 11.5–15.5)
WBC: 6.5 10*3/uL (ref 4.0–10.5)
nRBC: 0 % (ref 0.0–0.2)

## 2023-02-19 LAB — PHOSPHORUS: Phosphorus: 2.8 mg/dL (ref 2.5–4.6)

## 2023-02-19 LAB — HEPARIN LEVEL (UNFRACTIONATED): Heparin Unfractionated: 0.46 [IU]/mL (ref 0.30–0.70)

## 2023-02-19 LAB — MAGNESIUM: Magnesium: 1.8 mg/dL (ref 1.7–2.4)

## 2023-02-19 MED ORDER — WARFARIN SODIUM 5 MG PO TABS
5.0000 mg | ORAL_TABLET | Freq: Once | ORAL | Status: AC
  Administered 2023-02-19: 5 mg via ORAL
  Filled 2023-02-19: qty 1

## 2023-02-19 NOTE — Progress Notes (Signed)
   12 Days Post-Op Subjective: Incredible change since I saw [name] last. He's resting in bed alert and oriented with clear speech. We discussed definitive stone on an outpt basis and TOV in office. He is agreeable to this plan and states that he has an aid to assist him.  Objective: Vital signs in last 24 hours: SpO2:  [96 %-97 %] 96 % (02/10 1840) Weight:  [75.8 kg] 75.8 kg (02/11 0500)  Assessment/Plan: # Obstructing right ureteral calculus #AKI #hematuria #s/p CVA  Excellent recovery from CVA.  Hematuria since beginning anticoagulation. Relatively mild and may be persistent while foley is in.  Would like to attempt trial of void while in hospital, but he has been off of his rapaflo and finasteride during this admission. Discussed restarting those today and we will hope to have him in clinic for a voiding trial in the next few weeks depending on his OR date.  Will request OR schedulers to post his case for Dr. Annabell Howells. . OK to discharge from a Urologic perspective.   Intake/Output from previous day: 02/10 0701 - 02/11 0700 In: 490 [P.O.:480; I.V.:10] Out: 1425 [Urine:1425]  Intake/Output this shift: No intake/output data recorded.  Physical Exam:  General: Alert and oriented CV: No cyanosis Lungs: equal chest rise Gu: foley in place draining clear yellow urine  Lab Results: Recent Labs    02/17/23 0428 02/18/23 0353 02/19/23 0335  HGB 7.9* 7.8* 7.7*  HCT 25.3* 24.6* 24.8*   BMET Recent Labs    02/18/23 0353 02/19/23 0335  NA 141 140  K 3.3* 3.5  CL 113* 110  CO2 22 22  GLUCOSE 118* 104*  BUN 12 15  CREATININE 0.75 0.71  CALCIUM 7.8* 7.9*     Studies/Results: No results found.    LOS: 16 days   Elmon Kirschner, NP [facility] Pager: [phone]  02/19/2023, 7:47 AM

## 2023-02-19 NOTE — Progress Notes (Addendum)
PROGRESS NOTE    Gerald Lou.  ZOX:[id number] DOB: 1935-07-22 DOA: 02/02/2023 PCP: Jarrett Soho, PA-C  Chief Complaint  Patient presents with   Shortness of Breath    Brief Narrative:   [name]. is [name] 88 y.o. male who has an extensive PMH as below, significant for CAD, CVA, aortic valve replacement 2012, hypertension, pulmonary hypertension, atrial fibrillation and small bowel obstruction  . He presented to [facility] ED 1/25 with dyspnea that woke him from his sleep the night prior. He had also complained of LE edema over the preceding few weeks. On EMS arrival, he had sats of 85% on room air. He was placed on [place] O2 and given Solumedrol and DuoNebs in route to ED.   In ED, CXR was suggestive of mild edema. He was admitted by [facility] and started on diuresis. He was seen by cardiology in consultation who agreed with diuresis for fluid overload and obtaining an echo. Echo was obtained and demonstrated EF 60-65%, mod LVH, RV pressure overload with RVSP 61, LA and RA severely dilated.   On evening of 1/26, he developed nausea and vomiting with probable aspiration event. There was concern for SBO and was seen by general surgery who recommended continuing NPO and continuing NGT to suction.   On 1/27, diuresis was held due to bump in renal function after IV lasix. He was only net negative since admit. He did not appear to have excess volume per cardiology notes and appeared improved from CHF standpoint. Dyspnea and hypoxia were felt to more likely be related to aspiration PNA.  General surgery had also seen and he had benign exam and was passing flatus. He unfortunately pulled out his NGT that day.   After pulling out NGT 1/27, he had episode of tachypnea and increased WOB along with spike in temp to 102.33F. Due to increased WOB, PCCM asked to see in consultation. He had been on 4L [place] prior to this episode.   Blood cultures from admission returned positive for Enterobacter cloacae.  He was on Cefepime escalated to Meropenem.  Family report concerns of pt aspirating for over [name] week prior to admit. Further GOC clarified with pt's son, [name], changed to DNR/ DNI.   CT scan on 1/29 showed mild right hydro with 12 mm right proximal ureteral stone.  Urology was c/s and he's now s/p cystoscopy with R ureteral stent placement.  CT also showed hemorrhage within the dominant 8.5 cm cyst in the inferior pole of the right kidney as well as [name] small intramuscular bleed within the left iliopsoas.   He was found to have new facial droop and L sided weakness and altered speech on 2/1.  MRI showed acute L frontal and L temporal white matter infarcts.  He was seen by neurology.   Anticoagulation was held.  [name] repeat head CT on 2/5 showed no evidence of hemorrhagic conversion.  Heparin was restarted on 2/5 PM.   He's completed  antibiotics for his enterococcus bacteremia.  He appears stable with reinitiation of his warfarin, his INR is at goal.    I think he'll be ready for discharge soon, therapy is recommending SNF.  He'd like to go home with home health.  Lives home with wife (dementia) and has one 24 hour caregiver.  I'd like his son to discuss with therapy what he'd need going home as far as support to ensure the caregivers will be able to meet his needs.  Consider d/c within  next 24-48 hrs pending final decisions on HH vs SNF.   Assessment & Plan:   Principal Problem:   Acute respiratory failure with hypoxia (HCC) Active Problems:   Essential hypertension   Permanent atrial fibrillation (HCC)   CAD (coronary artery disease)   s/p mitral valve repair   Anemia due to chronic blood loss   Benign prostatic hyperplasia with lower urinary tract symptoms   Thrombocytopenia (HCC)   Gout   Hypokalemia   Left hemiparesis (HCC)   S/P AVR (aortic valve replacement)   History of ischemic right MCA stroke   Hyperglycemia   S/P tricuspid valve repair   S/P Maze operation for atrial  fibrillation   Prediabetes   Protein-calorie malnutrition, moderate (HCC)   HLD (hyperlipidemia)   Other secondary pulmonary hypertension (HCC)   Elevated troponin   Volume overload   Aspiration pneumonia (HCC)   DNR (do not resuscitate)   Ureteral calculus, right   Nephrolithiasis   Sepsis due to Enterobacter [facility])   Renal cyst  Goals of Care  Family Communication  Dispo See prior notes.  Plan for full code after discussion with Gerald Post (son).  Patient wants to go home, please discuss with therapy -> would ask them to touch base with Gerald Post (son) to ensure patient's caregiver could meet his needs if her were to go home (has 1 caregiver who would be caring for both [name] and his wife who has dementia).  Acute ischemic stroke: Thought to be related to FFP/platelet infusion MRI brain on 2/1 showed small acute left frontal and left temporal white matter infarct. MR angio showed severe stenosis of right P2 PCA Neurology follow up appreciated -> 02/10/2023 note Repeat head CT without evidence of hemorrhagic conversion  Seen by SLP- dysphagia 2 diet. He's now on warfarin    Acute hypoxic respiratory failure-multifactorial in the setting of aspiration pneumonia, fluid overload/CHF exacerbation: 02/09/2023: Off of oxygen. Continue aspiration precautions.   Continue pulmonary hygiene. Dysphagia diet.   Enterobacter Colace bacteremia in the setting of UTI  right ureteral stone with mild hydronephrosis: Septic shock-resolved 1/30 was not Yousuf Ager candidate for percutaneous  nephrostomy tube given elevated INR and thrombocytopenia.  S/p cystoscopy with right ureteral stent placement 1/30 Off of pressors now.  Fever improved. WBC improved. Cefepime changed to Meropenem due to encephalopathy. Continue antibiotics for total 14 days (antibiotics now complete). He has PICC line placed 02/05/23 - this can come out when he's ready to d/c. 1 of 2 cultures from 1/29 with staph epidermidis, suspect  contaminant   Indwelling Foley Catheter He's on flomax and finasteride, would discuss with urology whether trial of void can be attempted prior to d/c   Left iliopsoas intramuscular bleed Right renal cyst with hemorrhage  LUE Ecchymoses Discussed renal cyst hemorrhage with urology, ok with resuming anticoagulation   Ileus  NGT removed 1/27, passing flatus Rectal tube is no longer in place   Hypernatremia: improved   AKI: Baseline creatinine 0.9.  Worsened after IV diuresis and shock.  Mild right hydronephrosis with right renal calculi.  Status post stent placement on 1/30 by urology.  Renal function improved.   Afib - on coumadin Valvular disease s/p bioprosthetic AVR 2012, w/ MV and TV repair  CAD, non-obstructive  HFpEF: Remain euvolemic.  Continue to hold on beta-blocker and Coumadin.  Cardiology signed off on 02/06/2023. INR goal is 2-3 (2 today).  INR subtherapeutic since 2/2.  heparin gtt.  Will continue warfarin.  Hematuria noted 2/10, urine is amber (  hematuria resolved today).     Anemia of chronic disease Acute on chronic Thrombocytopenia Coagulapathy on coumadin  S/p 2 units platelets, 1 unit FFP, 4 units pRBC's  Transfuse additional unit pRBC today With repeat head CT this AM without evidence of hemorrhagic conversion, continue heparin gtt cautiously, will continue warfarin 2/7 Cardiology notably has recommended heparin bridge for INR <2 Per oncology, anemia thought due to sepsis, bleeding, renal failure, and phlebotomy - Hb expected to improve over next several weeks Dr. Truett Perna -> planning for outpatient follow up (may have early MDS vs anemia related to chronic blood loss), he notes it'll take weeks for Hb to recover   Moderate protein calorie malnutrition:  Dietitian on board Encourage oral diet, supplements.   Hyperglycemia- A1c 6.2 SSI     DVT prophylaxis: SCD Code Status: DNR Family Communication: son, Gerald Post.  Caregiver and wife at  bedside. Disposition:   Status is: Inpatient Remains inpatient appropriate because: need for continued inpatient care   Consultants:  Cardiology s/o 02/06/2023 Urology Neurology Oncology surgery  Procedures:   02/07/2023 Cystoscopy right ureteral stent placement right retrograde pyelography with interpretation   Echo IMPRESSIONS     1. S/P MV repair with mean gradient 8 mmHg and no MR; s/p AVR with mean  gradient 14 mmHg and no AI; s/p TV repair with mild TR.   2. Left ventricular ejection fraction, by estimation, is 60 to 65%. The  left ventricle has normal function. The left ventricle has no regional  wall motion abnormalities. There is moderate left ventricular hypertrophy.  Left ventricular diastolic  parameters are indeterminate. There is the interventricular septum is  flattened in systole and diastole, consistent with right ventricular  pressure and volume overload.   3. Right ventricular systolic function is normal. The right ventricular  size is mildly enlarged. There is severely elevated pulmonary artery  systolic pressure.   4. Left atrial size was severely dilated.   5. Right atrial size was severely dilated.   6. The mitral valve has been repaired/replaced. No evidence of mitral  valve regurgitation. No evidence of mitral stenosis. There is Diedre Maclellan prosthetic  annuloplasty ring present in the mitral position.   7. The tricuspid valve is has been repaired/replaced. The tricuspid valve  is status post repair with an annuloplasty ring.   8. The aortic valve has been repaired/replaced. Aortic valve  regurgitation is not visualized. No aortic stenosis is present. There is Olamide Lahaie  bioprosthetic valve present in the aortic position.   9. The inferior vena cava is dilated in size with <50% respiratory  variability, suggesting right atrial pressure of 15 mmHg.   Carotid US Summary:  Right Carotid: Velocities in the right ICA are consistent with Chevez Sambrano 1-39%  stenosis.   Left  Carotid: Velocities in the left ICA are consistent with Everrett Lacasse 1-39%  stenosis.   Vertebrals: Bilateral vertebral arteries demonstrate antegrade flow.  Subclavians: Normal flow hemodynamics were seen in bilateral subclavian               arteries.    Antimicrobials:  Anti-infectives (From admission, onward)    Start     Dose/Rate Route Frequency Ordered Stop   02/13/23 1130  meropenem (MERREM) 1 g in sodium chloride 0.9 % 100 mL IVPB        1 g 200 mL/hr over 30 Minutes Intravenous Every 8 hours 02/13/23 1043 02/17/23 2154   02/09/23 2200  meropenem (MERREM) 1 g in sodium chloride 0.9 % 100 mL IVPB  Status:  Discontinued        1 g 200 mL/hr over 30 Minutes Intravenous Every 12 hours 02/09/23 1402 02/13/23 1043   02/08/23 1100  ceFEPIme (MAXIPIME) 2 g in sodium chloride 0.9 % 100 mL IVPB  Status:  Discontinued        2 g 200 mL/hr over 30 Minutes Intravenous Every 12 hours 02/08/23 1001 02/09/23 1401   02/06/23 1700  ceFEPIme (MAXIPIME) 2 g in sodium chloride 0.9 % 100 mL IVPB  Status:  Discontinued        2 g 200 mL/hr over 30 Minutes Intravenous Every 24 hours 02/06/23 0917 02/08/23 1001   02/05/23 1700  meropenem (MERREM) 500 mg in sodium chloride 0.9 % 100 mL IVPB  Status:  Discontinued        500 mg 200 mL/hr over 30 Minutes Intravenous Every 12 hours 02/05/23 0718 02/06/23 0908   02/04/23 1700  meropenem (MERREM) 1 g in sodium chloride 0.9 % 100 mL IVPB  Status:  Discontinued        1 g 200 mL/hr over 30 Minutes Intravenous Every 12 hours 02/04/23 1457 02/05/23 0718   02/04/23 1200  Ampicillin-Sulbactam (UNASYN) 3 g in sodium chloride 0.9 % 100 mL IVPB  Status:  Discontinued        3 g 200 mL/hr over 30 Minutes Intravenous Every 12 hours 02/04/23 0203 02/04/23 0944   02/04/23 1100  ceFEPIme (MAXIPIME) 2 g in sodium chloride 0.9 % 100 mL IVPB  Status:  Discontinued        2 g 200 mL/hr over 30 Minutes Intravenous Every 24 hours 02/04/23 0945 02/04/23 1456   02/03/23 1200   Ampicillin-Sulbactam (UNASYN) 3 g in sodium chloride 0.9 % 100 mL IVPB  Status:  Discontinued        3 g 200 mL/hr over 30 Minutes Intravenous Every 6 hours 02/03/23 1115 02/04/23 0203       Subjective: He's asking about going home Therapy recommending SNF He's pretty insistent, discussed will discuss with son  Objective: Vitals:   02/19/23 0800 02/19/23 0901 02/19/23 0902 02/19/23 1231  BP: 135/78   135/75  Pulse: 62   63  Resp:    19  Temp: 98 F (36.7 C)   99.1 F (37.3 C)  TempSrc: Oral   Oral  SpO2: 98% 97% 97% 99%  Weight:      Height:        Intake/Output Summary (Last 24 hours) at 02/19/2023 1716 Last data filed at 02/19/2023 1711 Gross per 24 hour  Intake 570 ml  Output 1150 ml  Net -580 ml   Filed Weights   02/15/23 0417 02/18/23 0500 02/19/23 0500  Weight: 77.9 kg 77.8 kg 75.8 kg    Examination:  General: No acute distress. Cardiovascular: RRR Lungs: unlabored Abdomen: Soft, nontender, nondistended  Neurological: Alert and oriented 3. Moves all extremities 4 with equal strength. Cranial nerves II through XII grossly intact. Extremities: No clubbing or cyanosis. No edema.   Data Reviewed: I have personally reviewed following labs and imaging studies  CBC: Recent Labs  Lab 02/15/23 1021 02/16/23 0453 02/16/23 2107 02/17/23 0428 02/18/23 0353 02/19/23 0335  WBC 9.0 8.7  --  7.4 7.1 6.5  NEUTROABS  --  6.2 5.6 5.0 4.7 4.2  HGB 6.9* 8.3*  --  7.9* 7.8* 7.7*  HCT 22.8* 26.5*  --  25.3* 24.6* 24.8*  MCV 93.4 93.0  --  93.0 92.8 92.9  PLT 146* 163  --  189 197 223    Basic Metabolic Panel: Recent Labs  Lab 02/14/23 0220 02/14/23 1820 02/15/23 2322 02/16/23 0453 02/17/23 0428 02/18/23 0353 02/19/23 0335  NA 150*   < > 148* 148* 146* 141 140  K 3.3*   < > 3.8 3.6 3.3* 3.3* 3.5  CL 122*   < > 121* 121* 118* 113* 110  CO2 22   < > 20* 20* 22 22 22   GLUCOSE 146*   < > 108* 102* 110* 118* 104*  BUN 25*   < > 18 16 15 12 15   CREATININE  0.88   < > 0.99 0.93 0.75 0.75 0.71  CALCIUM 8.1*   < > 8.3* 8.5* 8.0* 7.8* 7.9*  MG 2.3  --   --  2.0 1.9 1.9 1.8  PHOS 2.7  --   --  2.4* 2.7 2.7 2.8   < > = values in this interval not displayed.    GFR: Estimated Creatinine Clearance: 72.4 mL/min (by C-G formula based on SCr of 0.71 mg/dL).  Liver Function Tests: Recent Labs  Lab 02/14/23 0220 02/16/23 0453 02/17/23 0428 02/18/23 0353 02/19/23 0335  AST 33 28 24 23 22   ALT 30 24 22 20 19   ALKPHOS 99 95 92 81 82  BILITOT 1.3* 1.3* 1.2 1.1 1.1  PROT 6.0* 6.2* 5.9* 5.5* 5.8*  ALBUMIN 2.1* 2.3* 2.2* 2.0* 2.2*    CBG: Recent Labs  Lab 02/18/23 2357 02/19/23 0331 02/19/23 0727 02/19/23 1132 02/19/23 1656  GLUCAP 114* 94 101* 119* 99     No results found for this or any previous visit (from the past 240 hours).        Radiology Studies: No results found.       Scheduled Meds:  sodium chloride   Intravenous Once   allopurinol  50 mg Oral q morning   arformoterol  15 mcg Nebulization BID   budesonide (PULMICORT) nebulizer solution  0.5 mg Nebulization BID   Chlorhexidine Gluconate Cloth  6 each Topical Daily   docusate  100 mg Oral Daily   feeding supplement  1 Bottle Oral TID BM   finasteride  5 mg Oral Daily   insulin aspart  0-9 Units Subcutaneous Q4H   multivitamin with minerals  1 tablet Oral Daily   mouth rinse  15 mL Mouth Rinse 4 times per day   pantoprazole  40 mg Oral BID   sodium chloride flush  10-40 mL Intracatheter Q12H   sodium chloride flush  3-10 mL Intravenous Q12H   tamsulosin  0.4 mg Oral QPC supper   warfarin  5 mg Oral ONCE-1600   Warfarin - Pharmacist Dosing Inpatient   Does not apply q1600   Continuous Infusions:  sodium chloride Stopped (02/07/23 1036)     LOS: 16 days    Time spent: over 30 min    Lacretia Nicks, MD [facility]   To contact the attending provider between 7A-7P or the covering provider during after hours 7P-7A, please log into the web  site [email or web address] and access using universal [place] password for that web site. If you do not have the password, please call the hospital operator.  02/19/2023, 5:16 PM

## 2023-02-19 NOTE — Progress Notes (Signed)
Physical Therapy Treatment Patient Details Name: [name]. MRN: [id number] DOB: [birth year] [name] Date: 02/19/2023   History of Present Illness Patient is a 88 year old male who presented on 1/25 with dyspnea and hypoxia. Patient was admited with concerns of SBO. 1/27 patient pulled out NG tube with increased work of breath and fever. 1/29 patient on HFNC with CT revealing mild right hydronephrosis with 12 mm right proximal ureteral stone.1/31 patient was taken off vasopressors and received PRBCs. 2/1 patient found to have left side weakness with code stroke called. MRI revealed L MCA. PMH: R TKR, L THR, L TKA,h/o stroke, peripheral neuropathy, gout, HTN.    PT Comments  Pt agreeable to working with therapy. He continues to exhibit general weakness, decreased activity tolerance, and impaired gait and balance. He is at risk for falls when mobilizing. PT continues to recommend post acute rehab-pt/family prefer HH f/u.     If plan is discharge home, recommend the following: A little help with walking and/or transfers;A little help with bathing/dressing/bathroom;Assistance with cooking/housework;Assist for transportation;Help with stairs or ramp for entrance   Can travel by private vehicle        Equipment Recommendations  None recommended by PT    Recommendations for Other Services       Precautions / Restrictions Precautions Precautions: Fall Restrictions Weight Bearing Restrictions Per Provider Order: No     Mobility  Bed Mobility Overal bed mobility: Needs Assistance Bed Mobility: Sit to Supine       Sit to supine: Supervision   General bed mobility comments: Supervision for lines, safety    Transfers Overall transfer level: Needs assistance Equipment used: Rolling walker (2 wheels) Transfers: Sit to/from Stand Sit to Stand: Min assist, From elevated surface           General transfer comment: Assist to steady. Cues for safety, hand placement. Increased  time.    Ambulation/Gait Ambulation/Gait assistance: Min assist Gait Distance (Feet): 60 Feet Assistive device: Rolling walker (2 wheels) Gait Pattern/deviations: Decreased step length - right, Decreased step length - left, Shuffle, Trunk flexed, Decreased dorsiflexion - right, Decreased dorsiflexion - left, Decreased weight shift to right, Decreased weight shift to left       General Gait Details: Task appeared more effortful on today. Cues for pt to increase step/stride length, for RW proximity, and for better clearance of foot instead of shuffling. Pt barely made the 60 foot distance on today 2* weakness and fatigue.   Stairs             Wheelchair Mobility     Tilt Bed    Modified Rankin (Stroke Patients Only)       Balance Overall balance assessment: Needs assistance         Standing balance support: Bilateral upper extremity supported, During functional activity, Reliant on assistive device for balance Standing balance-Leahy Scale: Poor                              Communication Communication Factors Affecting Communication: Hearing impaired  Cognition Arousal: Alert Behavior During Therapy: WFL for tasks assessed/performed   PT - Cognitive impairments: No apparent impairments                                Cueing    Exercises      General Comments  Pertinent Vitals/Pain Pain Assessment Pain Assessment: No/denies pain    Home Living                          Prior Function            PT Goals (current goals can now be found in the care plan section) Progress towards PT goals: Progressing toward goals    Frequency    Min 1X/week      PT Plan      Co-evaluation              AM-PAC PT "6 Clicks" Mobility   Outcome Measure  Help needed turning from your back to your side while in a flat bed without using bedrails?: A Little Help needed moving from lying on your back to sitting on  the side of a flat bed without using bedrails?: A Little Help needed moving to and from a bed to a chair (including a wheelchair)?: A Little Help needed standing up from a chair using your arms (e.g., wheelchair or bedside chair)?: A Little Help needed to walk in hospital room?: A Little Help needed climbing 3-5 steps with a railing? : Total 6 Click Score: 16    End of Session Equipment Utilized During Treatment: Gait belt Activity Tolerance: Patient limited by fatigue Patient left: in bed;with call bell/phone within reach;with bed alarm set   PT Visit Diagnosis: Muscle weakness (generalized) (M62.81);History of falling (Z91.81);Difficulty in walking, not elsewhere classified (R26.2)     Time: 1610-9604 PT Time Calculation (min) (ACUTE ONLY): 11 min  Charges:    $Gait Training: 8-22 mins PT General Charges $$ ACUTE PT VISIT: 1 Visit                         Faye Ramsay, PT Acute Rehabilitation  Office: [phone]

## 2023-02-19 NOTE — Progress Notes (Addendum)
PHARMACY - ANTICOAGULATION CONSULT NOTE  Pharmacy Consult: Heparin + warfarin >> warfarin monotherapy Indication: bioprosthetic aortic valve, mitral valve repair, and Afib. -Acute ischemic stroke  Allergies  Allergen Reactions   Sulfa Antibiotics Other (See Comments)    Granulocytosis   Sulfamethoxazole-Trimethoprim     Other Reaction(s): Unknown   Zestril [Lisinopril] Cough    Patient Measurements: Height: 6' (182.9 cm) Weight: 75.8 kg (167 lb 1.7 oz) IBW/kg (Calculated) : 77.6 Heparin Dosing Weight: 77.9 kg  Vital Signs:    Labs: Recent Labs    02/17/23 0428 02/17/23 1353 02/18/23 0353 02/19/23 0335  HGB 7.9*  --  7.8* 7.7*  HCT 25.3*  --  24.6* 24.8*  PLT 189  --  197 223  LABPROT 18.0*  --  19.7* 22.6*  INR 1.5*  --  1.6* 2.0*  HEPARINUNFRC 0.23* 0.35 0.40 0.46  CREATININE 0.75  --  0.75 0.71    Estimated Creatinine Clearance: 72.4 mL/min (by C-G formula based on SCr of 0.71 mg/dL).   Assessment:  AC/Heme: On warfarin 5mg  daily except 7.5 mg on Mon PTA for bioprosthetic aortic valve, mitral valve repair, and Afib. -Acute ischemic stroke, MRI 2/1 w/ small left frontal and temporal white matter infarct  -Left iliopsoas IM bleed, renal cyst w/ hemorrhage, LUE hematoma - Cardiology recommended heparin bridge with warfarin while INR <2 (see 1/29 cardiology note) -Heparin resumed 2/5 - 2/7: Hgb 6.9, transfused. Resume warfarin - 2/11: HL therapeutic at 0.46 (higher end of range), INR now therapeutic at 2, Hgb slowly down-trending at 7.7, Plts improved to 223, RN notes some hematuria yesterday and today (reportedly similar amounts both days) but no other s/sx of bleeding.   >Per Dr. Truett Perna, hgb will take weeks to recover   >Now that INR 2.0, okay to discontinue heparin gtt and continue warfarin monotherapy, per MD.  Goal of Therapy:  Hep level 0.3-0.5 INR goal 2.0-3.0 Monitor platelets by anticoagulation protocol: Yes   Plan:  - Discontinue heparin -  Coumadin 5 mg po x 1 today - Daily CBC and INR   Cherylin Mylar, PharmD Clinical Pharmacist  2/11/20257:01 AM

## 2023-02-19 NOTE — Progress Notes (Addendum)
[name].   DOB:[birth year]   OZ#:[id number]      ASSESSMENT & PLAN:  1.  Acute anemia - Likely secondary to sepsis, renal insufficiency and frequent phlebotomy.  It may take several weeks for his hemoglobin to recover. -Patient may have an early underlying myelodysplasia however no treatment is indicated at the present time - Hemoglobin low 7.7 today.  - Transfuse PRBC for hemoglobin <7.0. - Recommend limiting blood draws during hospitalization. - Medical oncology/Dr. Truett Perna following closely.  Patient to follow-up as outpatient upon discharge.  2.  Thrombocytopenia - Resolved - Platelets 223K today - No interventions warranted.  Will monitor CBC with differential upon discharge.  3.  Urosepsis - WBC has normalized - Status post cystoscopy and right ureteral stent placement. - Status post antibiotics - Monitor fever curve  4.  Acute ischemic stroke/acute hypoxic respiratory failure - New onset left-sided weakness.  Status post code stroke.  MRI brain showed small acute left frontal and left temporal white matter infarct. - Being closely monitored  5.  A-fib - Anticoagulation with Coumadin restarted - Cardiology managing  Code Status Full   Subjective:  Patient seen awake and alert laying supine in bed.  Denies new acute complaints.  Reports that he is tired of laying in the bed and would really like to go home.  States he has been eating and ambulating well.  Also states his wife and a caregiver is at home to help to take care for him.   Objective:  Vitals:   02/19/23 0901 02/19/23 0902  BP:    Pulse:    Resp:    Temp:    SpO2: 97% 97%     Intake/Output Summary (Last 24 hours) at 02/19/2023 0947 Last data filed at 02/19/2023 9629 Gross per 24 hour  Intake 690 ml  Output 1425 ml  Net -735 ml     REVIEW OF SYSTEMS:   Constitutional: +Fatigue, denies fevers, chills or abnormal night sweats Eyes: Denies blurriness of vision, double vision or watery  eyes Ears, nose, mouth, throat, and face: Denies mucositis or sore throat Respiratory: Denies cough, dyspnea or wheezes Cardiovascular: Denies palpitation, chest discomfort or lower extremity swelling Gastrointestinal:  Denies nausea, heartburn or change in bowel habits Skin: Denies abnormal skin rashes Lymphatics: Denies new lymphadenopathy or easy bruising Neurological: Denies numbness, tingling or new weaknesses Behavioral/Psych: Mood is stable, no new changes  All other systems were reviewed with the patient and are negative.  PHYSICAL EXAMINATION: ECOG PERFORMANCE STATUS: 3 - Symptomatic, >50% confined to bed  Vitals:   02/19/23 0901 02/19/23 0902  BP:    Pulse:    Resp:    Temp:    SpO2: 97% 97%   Filed Weights   02/15/23 0417 02/18/23 0500 02/19/23 0500  Weight: 171 lb 11.8 oz (77.9 kg) 171 lb 8.3 oz (77.8 kg) 167 lb 1.7 oz (75.8 kg)    GENERAL: alert, no distress and comfortable SKIN: +Multiple ecchymotic areas bilateral upper extremities, skin color, texture, turgor are normal, no rashes or significant lesions EYES: normal, conjunctiva are pink and non-injected, sclera clear OROPHARYNX: no exudate, no erythema and lips, buccal mucosa, and tongue normal  NECK: supple, thyroid normal size, non-tender, without nodularity LYMPH: no palpable lymphadenopathy in the cervical, axillary or inguinal LUNGS: clear to auscultation and percussion with normal breathing effort HEART: regular rate & rhythm and no murmurs and no lower extremity edema ABDOMEN: abdomen soft, non-tender and normal bowel sounds MUSCULOSKELETAL: +Ambulates with assist PSYCH:  alert & oriented x 3 with fluent speech NEURO: no focal motor/sensory deficits   All questions were answered. The patient knows to call the clinic with any problems, questions or concerns.   The total time spent in the appointment was 40 minutes encounter with patient including review of chart and various tests results, discussions  about plan of care and coordination of care plan  Dawson Bills, NP 02/19/2023 9:47 AM    Labs Reviewed:  Lab Results  Component Value Date   WBC 6.5 02/19/2023   HGB 7.7 (L) 02/19/2023   HCT 24.8 (L) 02/19/2023   MCV 92.9 02/19/2023   PLT 223 02/19/2023   Recent Labs    02/17/23 0428 02/18/23 0353 02/19/23 0335  NA 146* 141 140  K 3.3* 3.3* 3.5  CL 118* 113* 110  CO2 22 22 22   GLUCOSE 110* 118* 104*  BUN 15 12 15   CREATININE 0.75 0.75 0.71  CALCIUM 8.0* 7.8* 7.9*  GFRNONAA >60 >60 >60  PROT 5.9* 5.5* 5.8*  ALBUMIN 2.2* 2.0* 2.2*  AST 24 23 22   ALT 22 20 19   ALKPHOS 92 81 82  BILITOT 1.2 1.1 1.1    Studies Reviewed:  CT HEAD WO CONTRAST ( ) Result Date: 02/13/2023 CLINICAL DATA:  Provided history: Stroke, follow-up. EXAM: CT HEAD WITHOUT CONTRAST TECHNIQUE: Contiguous axial images were obtained from the base of the skull through the vertex without intravenous contrast. RADIATION DOSE REDUCTION: This exam was performed according to the departmental dose-optimization program which includes automated exposure control, adjustment of the mA and/or kV according to patient size and/or use of iterative reconstruction technique. COMPARISON:  Brain MRI 02/09/2023.  Noncontrast head CT 02/09/2023. FINDINGS: Brain: Generalized cerebral atrophy. Known small acute white matter infarct within the posterior left frontal lobe. A known small acute white matter infarct within the posterior left temporal lobe is occult by CT and was better appreciated on the recent prior brain MRI of 02/09/2023. Chronic right MCA territory and right cerebellar infarcts again noted. Mild patchy and ill-defined hypoattenuation elsewhere within the cerebral white matter, nonspecific but compatible chronic small vessel disease. There is no acute intracranial hemorrhage. No evidence of an intracranial mass. No midline shift. Vascular: No hyperdense vessel.  Atherosclerotic calcifications. Skull: No calvarial fracture  or aggressive osseous lesion. Sinuses/Orbits: No mass or acute finding within the imaged orbits. No significant paranasal sinus disease at the imaged levels. Other: Right mastoid effusion. IMPRESSION: 1. Known small acute white matter infarcts within the left frontal and left temporal lobes, not appreciably changed in extent from the recent prior brain MRI of 02/09/2023. No evidence of hemorrhagic conversion. 2. Parenchymal atrophy, chronic small vessel ischemic disease and chronic infarcts, as described. 3. Right mastoid effusion. Electronically Signed   By: Jackey Loge D.O.   On: 02/13/2023 12:37   DG Swallowing Func-Speech Pathology Result Date: 02/12/2023 Table formatting from the original result was not included. Clinical Impressions: Pt presents with a characterized by reduced strength and coordination. Participation was significantly limited by fatigue, mentation, and hesistance to potential gagging. He declined to use straws or take sequential sips, so thin liquids were assessed through single cup sips. Due to incomplete epiglottic inversion and partial laryngeal elevation, thin liquids consistently result in penetration above the level of the vocal folds that does not spontaneously clear (PAS 3). He initiates effective throat clearance as it progresses, indicating intact sensation. After multiple subswallows, penetrates are eventually expelled. There is diffuse pharyngeal residue that increases with thickened liquid consistencies  and especially solids. Chin tuck and L head turn postures are ineffective in preventing this penetration or reducing residue. A liquid wash does clear vallecular residue, although any liquid results in a collection of residue in pt's pyriform sinuses. Pt states preference to continue full liquid diet at present but feel that as he is willing, diet could be advanced to modified texture solids. No significant esophageal retention or backflow was noted, although suspected cervical  osteophytes contribute to reduced distention at the level of the PES. Recommend continuing full liquid diet as that is with what pt feels most comfortable. He may benefit from ongoing education and intervention to maintain his strength and coordination. SLP will continue following. Recommendations: Full Liquid Diet  02/09/23 1148 SLP Visit Information SLP Received On 02/09/23 Pain Assessment Pain Assessment Faces Faces Pain Scale 4 Pain Location generalized Pain Descriptors / Indicators Discomfort;Grimacing Pain Intervention(s) Monitored during session General Information HPI 87yo male admitted 02/02/23 with SOB 2/2 CHF exacerbation with possible aspiration event. PMH: acquired dilation of the ascending aorta and aortic root, CAD, AFib, Right basal ganglia CVA (2018), HTN, heart murmur, AVR, OA, BPH, SBO, neuropathy, thrombocytopenia.  PMH+ for dysphagia from CVA 2018 requiring thickener and another CVA that did not impair his swallowing. Pt developed some respiratory difficulties this am - and was placed on 4 L HFNC - s/p surgery with urology stent and then sent from surgery to ICU on vasopressors and Bipap 1/30. Subsequently taken off Bipap and awoke adequately for po. Caregiver present No Diet Prior to this Study Full liquid diet Temperature  Normal Respiratory Status Tachypneic Supplemental O2 None (Room air) History of Recent Intubation No Behavior/Cognition Alert;Cooperative;Pleasant mood Self-Feeding Abilities Dependent for feeding Baseline vocal quality/speech Normal Volitional Cough Able to elicit Volitional Cough Assessment Appears WFL Volitional Swallow Able to elicit Anatomy Suspected cervical osteophytes Orofacial Exam Oral Cavity: Oral Hygiene WFL Oral Cavity - Dentition Adequate natural dentition;Missing dentition Orofacial Anatomy WFL Oral Motor/Sensory Function WFL Boluses Administered Boluses Administered Thin liquids (Level 0);Mildly thick liquids (Level 2, nectar thick);Puree;Solid Oral Impairment  Domain Lip Closure Interlabial escape, no progression to anterior lip Tongue control during bolus hold Cohesive bolus between tongue to palatal seal Bolus preparation/mastication Timely and efficient chewing and mashing Bolus transport/lingual motion Brisk tongue motion Oral residue Trace residue lining oral structures Location of oral residue  Tongue;Palate Initiation of pharyngeal swallow  Pyriform sinuses Pharyngeal Impairment Domain Soft palate elevation No bolus between soft palate (SP)/pharyngeal wall (PW) Laryngeal elevation Partial superior movement of thyroid cartilage/partial approximation of arytenoids to epiglottic petiole Anterior hyoid excursion Partial anterior movement Epiglottic movement Partial inversion Laryngeal vestibule closure Incomplete, narrow column air/contrast in laryngeal vestibule Pharyngeal stripping wave  Present - diminished Pharyngeal contraction (A/P view only) N/A Pharyngoesophageal segment opening Partial distention/partial duration, partial obstruction of flow Tongue base retraction Trace column of contrast or air between tongue base and PPW Pharyngeal residue Majority of contrast within or on pharyngeal structures Location of pharyngeal residue Tongue base;Valleculae;Pyriform sinuses;Pharyngeal wall;Diffuse (>3 areas) Esophageal Impairment Domain Esophageal clearance upright position Complete clearance, esophageal coating Penetration/Aspiration Scale Score 1.  Material does not enter airway Puree;Solid 3.  Material enters airway, remains ABOVE vocal cords and not ejected out Thin liquids (Level 0) Compensatory Strategies Compensatory strategies Yes Chin tuck Ineffective Ineffective Chin Tuck Thin liquid (Level 0) Left head turn Ineffective Ineffective Left Head Turn Thin liquid (Level 0) Clinical Impression Clinical Impression Pt presents with a characterized by reduced strength and coordination. Participation was significantly limited by  fatigue, mentation, and hesistance to  potential gagging. He declined to use straws or take sequential sips, so thin liquids were assessed through single cup sips. Due to incomplete epiglottic inversion and partial laryngeal elevation, thin liquids consistently result in penetration above the level of the vocal folds that does not spontaneously clear (PAS 3). He initiates effective throat clearance as it progresses, indicating intact sensation. After multiple subswallows, penetrates are eventually expelled. There is diffuse pharyngeal residue that increases with thickened liquid consistencies and especially solids. Chin tuck and L head turn postures are ineffective in preventing this penetration or reducing residue. A liquid wash does clear vallecular residue, although any liquid results in a collection of residue in pt's pyriform sinuses. Pt states preference to continue full liquid diet at present but feel that as he is willing, diet could be advanced to modified texture solids. No significant esophageal retention or backflow was noted, although suspected cervical osteophytes contribute to reduced distention at the level of the PES. Recommend continuing full liquid diet as that is with what pt feels most comfortable. He may benefit from ongoing education and intervention to maintain his strength and coordination. SLP will continue following. SLP Visit Diagnosis Dysphagia, oropharyngeal phase (R13.12) Factors that may increase risk of adverse event in presence of aspiration [employer] 2021) Poor general health and/or compromised immunity;Reduced cognitive function;Frail or deconditioned;Dependence for feeding and/or oral hygiene;Inadequate oral hygiene;Reduced saliva Exam Limitations Poor participation;Poor bolus acceptance;Fatigue Swallowing Evaluation Recommendations Recommendations PO diet PO Diet Recommendation Full liquid diet Liquid Administration via Spoon;Cup Medication Administration Whole meds with puree (or continue with Ensure)  Supervision Full assist for feeding;Full supervision/cueing for swallowing strategies Swallowing strategies   Minimize environmental distractions;Slow rate;Small bites/sips;Multiple dry swallows after each bite/sip;Follow solids with liquids Postural changes Position pt fully upright for meals;Stay upright 30-60 min after meals Oral care recommendations Oral care BID (2x/day) Treatment Plan Treatment recommendations Therapy as outlined in treatment plan below Follow-up recommendations Outpatient SLP Functional status assessment Patient has had a recent decline in their functional status and demonstrates the ability to make significant improvements in function in a reasonable and predictable amount of time. Treatment frequency Min 2x/week Treatment duration 2 weeks Interventions Aspiration precaution training;Oropharyngeal exercises;Patient/family education;Trials of upgraded texture/liquids;Diet toleration management by SLP Goal Planning Prognosis for improved oropharyngeal function Fair Barriers to Reach Goals Cognitive deficits;Time post onset Patient/Family Stated Goal for pt to get better Consulted and agree with results and recommendations Patient SLP Time Calculation SLP Start Time (ACUTE ONLY) 1117 SLP Stop Time (ACUTE ONLY) 1142 SLP Time Calculation (min) (ACUTE ONLY) 25 min SLP Evaluations $ SLP Speech Visit 1 Visit SLP Evaluations $MBS Swallow 1 Procedure   VAS US CAROTID Result Date: 02/10/2023 Carotid Arterial Duplex Study Patient Name:  Perrin Gens.  Date of Exam:   02/10/2023 Medical Rec #: [id number]              Accession #:    [id number] Date of Birth: 1935/07/12              Patient Gender: M Patient Age:   21 years Exam Location:  [facility] Procedure:      VAS US CAROTID Referring Phys: Brooke Dare --------------------------------------------------------------------------------  Indications:       CVA. Risk Factors:      Hypertension, coronary artery disease, prior CVA. Other  Factors:     AVR 2012, afib, pulmonary hypertension, mitral and tricuspid  valve repair. Comparison Study:  Prior carotid duplex done 01/20/18 indicating 1-39% ICA                    stenosis, bilaterally Performing Technologist: Fernande Bras  Examination Guidelines: A complete evaluation includes B-mode imaging, spectral Doppler, color Doppler, and power Doppler as needed of all accessible portions of each vessel. Bilateral testing is considered an integral part of a complete examination. Limited examinations for reoccurring indications may be performed as noted.  Right Carotid Findings: +----------+--------+--------+--------+------------------+------------------+           PSV cm/sEDV cm/sStenosisPlaque DescriptionComments           +----------+--------+--------+--------+------------------+------------------+ CCA Prox  161     28                                intimal thickening +----------+--------+--------+--------+------------------+------------------+ CCA Distal116     14                                intimal thickening +----------+--------+--------+--------+------------------+------------------+ ICA Prox  101     23      1-39%   heterogenous                         +----------+--------+--------+--------+------------------+------------------+ ICA Mid   97      22                                                   +----------+--------+--------+--------+------------------+------------------+ ICA Distal74      19                                                   +----------+--------+--------+--------+------------------+------------------+ ECA       118     12                                                   +----------+--------+--------+--------+------------------+------------------+ +----------+--------+-------+--------+-------------------+           PSV cm/sEDV cmsDescribeArm Pressure (mmHG)  +----------+--------+-------+--------+-------------------+ [id number]      18                                 +----------+--------+-------+--------+-------------------+ +---------+--------+---+--------+--+ VertebralPSV cm/s104EDV cm/s21 +---------+--------+---+--------+--+  Left Carotid Findings: +----------+--------+--------+--------+------------------+------------------+           PSV cm/sEDV cm/sStenosisPlaque DescriptionComments           +----------+--------+--------+--------+------------------+------------------+ CCA Prox  115     17                                intimal thickening +----------+--------+--------+--------+------------------+------------------+ CCA Distal112     22  intimal thickening +----------+--------+--------+--------+------------------+------------------+ ICA Prox  134     26      1-39%   heterogenous                         +----------+--------+--------+--------+------------------+------------------+ ICA Mid   125     24                                                   +----------+--------+--------+--------+------------------+------------------+ ICA Distal122     32                                                   +----------+--------+--------+--------+------------------+------------------+ ECA       138     12                                                   +----------+--------+--------+--------+------------------+------------------+ +----------+--------+--------+--------+-------------------+           PSV cm/sEDV cm/sDescribeArm Pressure (mmHG) +----------+--------+--------+--------+-------------------+ [id number]     19                                  +----------+--------+--------+--------+-------------------+ +---------+--------+--+--------+--+ VertebralPSV cm/s74EDV cm/s15 +---------+--------+--+--------+--+   Summary: Right Carotid: Velocities in the right ICA are  consistent with a 1-39% stenosis. Left Carotid: Velocities in the left ICA are consistent with a 1-39% stenosis. Vertebrals:  Bilateral vertebral arteries demonstrate antegrade flow. Subclavians: Normal flow hemodynamics were seen in bilateral subclavian              arteries. *See table(s) above for measurements and observations.  Electronically signed by Lemar Livings MD on 02/10/2023 at 4:01:14 PM.    Final    DG CHEST PORT 1 VIEW Result Date: 02/10/2023 CLINICAL DATA:  Respiratory failure EXAM: PORTABLE CHEST 1 VIEW COMPARISON:  02/05/2023 FINDINGS: Right-sided PICC line terminates at the superior caval/atrial junction. The low left hemithorax is minimally excluded. Median sternotomy for aortic valve repair. Left atrial appendage occlusion device. Midline trachea. Mild cardiomegaly. No right and no definite left pleural effusion. No pneumothorax. Left base scarring or subsegmental atelectasis. No lobar consolidation. IMPRESSION: Cardiomegaly without congestive failure. Electronically Signed   By: Jeronimo Greaves M.D.   On: 02/10/2023 09:22   MR ANGIO HEAD WO CONTRAST Result Date: 02/10/2023 CLINICAL DATA:  Stroke/TIA, determine embolic source EXAM: MRA HEAD WITHOUT CONTRAST TECHNIQUE: Angiographic images of the Circle of Willis were acquired using MRA technique without intravenous contrast. COMPARISON:  None Available. FINDINGS: Anterior circulation: Bilateral intracranial ICAs, MCAs and ACAs are patent without proximal hemodynamically significant stenosis. Posterior circulation: Bilateral intradural vertebral arteries and basilar artery are patent without significant stenosis. Severe stenosis of the right P2 PCA. Left PCA is patent without significant stenosis IMPRESSION: Severe stenosis of the right P2 PCA. Electronically Signed   By: Feliberto Harts M.D.   On: 02/10/2023 00:03   [name] Result Date: 02/09/2023 CLINICAL DATA:  Neuro deficit, acute, stroke suspected. EXAM: MRI HEAD WITHOUT  CONTRAST  TECHNIQUE: Multiplanar, multiecho pulse sequences of the brain and surrounding structures were obtained without intravenous contrast. COMPARISON:  Head CT 02/09/2023 and MRI 11/09/2021 FINDINGS: Brain: There are small acute infarcts involving subcortical white matter in the posterior left frontal lobe and posterior left temporal lobe. A few small chronic cerebral and cerebellar microhemorrhages are unchanged from the prior MRI. Moderate-sized chronic right MCA and small chronic right cerebellar infarcts are again noted with ex vacuo dilatation of the right lateral ventricle. There is a background of mild chronic small vessel ischemia in the cerebral white matter which is similar to the prior MRI. There is moderate cerebral atrophy with prominent extra-axial CSF over the right greater than left cerebral convexities being unchanged from the prior MRI and attributed to atrophy and subarachnoid space enlargement. No mass, midline shift, or definite extra-axial fluid collection is identified. Vascular: Major intracranial vascular flow voids are preserved. Skull and upper cervical spine: Unremarkable bone marrow signal. Sinuses/Orbits: Bilateral cataract extraction. Clear paranasal sinuses. Large chronic right mastoid effusion. Other: None. IMPRESSION: 1. Small acute left frontal and left temporal white matter infarcts. 2. Chronic ischemia including old right MCA and right cerebellar infarcts. Electronically Signed   By: Sebastian Ache M.D.   On: 02/09/2023 15:05   CT HEAD CODE STROKE WO CONTRAST Result Date: 02/09/2023 CLINICAL DATA:  Code stroke. Neuro deficit, acute, stroke suspected. EXAM: CT HEAD WITHOUT CONTRAST TECHNIQUE: Contiguous axial images were obtained from the base of the skull through the vertex without intravenous contrast. RADIATION DOSE REDUCTION: This exam was performed according to the departmental dose-optimization program which includes automated exposure control, adjustment of the mA  and/or kV according to patient size and/or use of iterative reconstruction technique. COMPARISON:  Head CT 02/16/2022 and MRI 11/09/2021 FINDINGS: Brain: There is no evidence of an acute cortically based infarct, intracranial hemorrhage, mass, midline shift, or extra-axial fluid collection. A moderate-sized chronic right MCA infarct is unchanged with involvement of the operculum, insula, and basal ganglia and associated ex vacuo dilatation of the body and frontal horn of the right lateral ventricle. A new 7 mm discrete hypodense focus in the subcortical white matter of the posterior left frontal lobe is suggestive of an age indeterminate lacunar infarct. Hypodensities elsewhere in the cerebral white matter bilaterally are unchanged and nonspecific but compatible with mild chronic small vessel ischemic disease. A small chronic right cerebellar infarct is unchanged. There is moderate cerebral atrophy. Enlarged extra-axial CSF spaces over the frontal convexities are unchanged and attributed to atrophy. Vascular: Calcified atherosclerosis at the skull base. No hyperdense vessel. Skull: No acute fracture or suspicious osseous lesion. Sinuses/Orbits: Visualized paranasal sinuses are clear. Large chronic right mastoid effusion. Bilateral cataract extraction. Other: None. ASPECTS (Alberta Stroke Program Early CT Score) - Ganglionic level infarction (caudate, lentiform nuclei, internal capsule, insula, M1-M3 cortex): 7 - Supraganglionic infarction (M4-M6 cortex): 3 Total score (0-10 with 10 being normal): 10 These results were communicated to Dr. Iver Nestle at 1:30 pm on 02/09/2023 by text page via the [facility] messaging system. IMPRESSION: 1. No evidence of acute cortically based infarct or intracranial hemorrhage. ASPECTS of 10. 2. Age indeterminate left frontal white matter lacunar infarct. 3. Chronic ischemia including an old right MCA infarct. Electronically Signed   By: Sebastian Ache M.D.   On: 02/09/2023 13:31   DG C-Arm 1-60  Min-No Report Result Date: 02/07/2023 Fluoroscopy was utilized by the requesting physician.  No radiographic interpretation.   CT RENAL STONE STUDY Result Date: 02/06/2023 CLINICAL DATA:  Abdominal/flank pain.  Concern for kidney stone. EXAM: CT ABDOMEN AND PELVIS WITHOUT CONTRAST TECHNIQUE: Multidetector CT imaging of the abdomen and pelvis was performed following the standard protocol without IV contrast. RADIATION DOSE REDUCTION: This exam was performed according to the departmental dose-optimization program which includes automated exposure control, adjustment of the mA and/or kV according to patient size and/or use of iterative reconstruction technique. COMPARISON:  CT abdomen pelvis dated 09/23/2021. FINDINGS: Evaluation of this exam is limited in the absence of intravenous contrast. Lower chest: Small bilateral pleural effusions with associated partial compressive atelectasis of the lung bases. Mild cardiomegaly. Tricuspid mechanical bowel. Coronary vascular calcification. No intra-abdominal free air or free fluid. Hepatobiliary: The liver is unremarkable. No biliary dilatation. Cholecystectomy. No retained calcified stone noted in the central CBD. Pancreas: Unremarkable. No pancreatic ductal dilatation or surrounding inflammatory changes. Spleen: Normal in size without focal abnormality. Adrenals/Urinary Tract: The adrenal glands unremarkable. There is a 12 mm stone in the proximal right ureter close to the ureteropelvic junction with mild right hydronephrosis. Several additional nonobstructing right renal calculi measure up to 1 cm in the interpolar aspect of the right kidney. Multiple right renal cysts are poorly characterized on this noncontrast CT but were present on the prior CT. A 12 mm high attenuating exophytic cyst from the inferior pole of the right kidney likely represents a hemorrhagic or proteinaceous cyst. There has been interval development of high attenuating content within a exophytic  8.5 cm cyst arising from the inferior pole of the right kidney consistent with hemorrhage. The possibility of active lesional is not excluded but cannot be evaluated on this noncontrast CT. No significant perinephric fluid. There is no hydronephrosis or nephrolithiasis on the left. The left ureter is unremarkable. The urinary bladder is decompressed around a Foley catheter. Stomach/Bowel: There is sigmoid diverticulosis. There is no bowel obstruction or active inflammation. The appendix is normal. Vascular/Lymphatic: Mild aortoiliac atherosclerotic disease. The IVC is unremarkable. No portal gas. There is no adenopathy. Reproductive: Mildly enlarged prostate gland measuring 5 cm in transverse axial diameter. The seminal vesicles are symmetric. Other: Midline vertical anterior abdominal wall incisional scar. There is mild asymmetric enlargement of the left iliacus muscle with an ill-defined high attenuating area suspicious for an intramuscular bleed. Slight asymmetric enlargement of the left psoas muscle also noted. Musculoskeletal: Small fat containing midline supraumbilical hernia. There is osteopenia with degenerative changes of the spine. No acute osseous pathology. Total left hip arthroplasty. IMPRESSION: 1. A 12 mm proximal right ureteral stone with mild right hydronephrosis. 2. Additional nonobstructing right renal calculi. 3. Hemorrhage within the dominant 8.5 cm cyst in the inferior pole of the right kidney. Active bleed or underlying malignancy is not excluded but cannot be evaluated on this noncontrast CT. Urology consult and follow-up recommended to exclude a bleeding mass. 4. Small intramuscular bleed within the left iliopsoas. Correlation with anticoagulation status recommended. 5. Sigmoid diverticulosis. No bowel obstruction. Normal appendix. 6. Small bilateral pleural effusions with associated partial compressive atelectasis of the lung bases. 7.  Aortic Atherosclerosis (ICD10-I70.0). Electronically  Signed   By: Elgie Collard M.D.   On: 02/06/2023 10:54   DG CHEST PORT 1 VIEW Result Date: 02/05/2023 CLINICAL DATA:  PICC placement. EXAM: PORTABLE CHEST 1 VIEW COMPARISON:  Chest radiograph dated 02/04/2023. FINDINGS: Right-sided PICC with tip over central SVC. Improved aeration of the lungs compared to prior radiograph. No focal consolidation or pneumothorax. Trace bilateral pleural effusions suspected. Stable cardiac silhouette. Median sternotomy wires and mechanical cardiac valve. No acute osseous pathology.  IMPRESSION: 1. Right-sided PICC with tip over central SVC. 2. Improved aeration of the lungs. Electronically Signed   By: Elgie Collard M.D.   On: 02/05/2023 15:12   [place] EKG SITE RITE Result Date: 02/05/2023 If Site Rite image not attached, placement could not be confirmed due to current cardiac rhythm.  DG CHEST PORT 1 VIEW Result Date: 02/04/2023 CLINICAL DATA:  Shortness of breath. EXAM: PORTABLE CHEST 1 VIEW COMPARISON:  Chest radiograph dated 02/03/2023. FINDINGS: There is cardiomegaly with vascular congestion. No focal consolidation, pleural effusion, pneumothorax. Median sternotomy wires and mechanical cardiac valve. No acute osseous pathology. IMPRESSION: Cardiomegaly with vascular congestion. Electronically Signed   By: Elgie Collard M.D.   On: 02/04/2023 14:25   DG Abd 1 View Result Date: 02/04/2023 CLINICAL DATA:  Nausea and vomiting. EXAM: ABDOMEN - 1 VIEW COMPARISON:  02/03/2023 FINDINGS: Nasogastric tube tip is in the left upper abdomen and likely in the gastric body region. Bowel gas in the abdomen and pelvis with a nonobstructive pattern. Surgical clips in the right abdomen. Left hip arthroplasty. Surgical clips in the right groin. Two large calcifications in the region of the right renal shadow both measuring approximately 1 cm. 1 of these calcifications could be in the renal pelvic region. IMPRESSION: 1. Right renal calculi, largest measuring up to 1.0 cm. One stone  could be in the renal pelvic region. This could be better evaluated with CT imaging. 2. Nonobstructive bowel gas pattern. 3. Tip of the nasogastric tube is in the gastric body region. Electronically Signed   By: Richarda Overlie M.D.   On: 02/04/2023 10:04   ECHOCARDIOGRAM COMPLETE Result Date: 02/03/2023    ECHOCARDIOGRAM REPORT   Patient Name:   Mayes Sangiovanni. Date of Exam: 02/03/2023 Medical Rec #:  [id number]             Height:       72.0 in Accession #:    [id number]            Weight:       173.3 lb Date of Birth:  01/03/1936             BSA:          2.005 m Patient Age:    87 years              BP:           91/64 mmHg Patient Gender: M                     HR:           76 bpm. Exam Location:  Inpatient Procedure: 2D Echo, Cardiac Doppler, Color Doppler and Intracardiac            Opacification Agent Indications:    TIA  History:        Patient has prior history of Echocardiogram examinations, most                 recent 03/19/2022. Previous Myocardial Infarction and CAD, COPD,                 Arrythmias:Atrial Fibrillation; Risk Factors:Hypertension.                 Aortic Valve: bioprosthetic valve is present in the aortic                 position.  Mitral Valve: prosthetic annuloplasty ring valve is present in                 the mitral position.  Sonographer:    Webb Laws Referring Phys: [id number] DAVID MANUEL ORTIZ IMPRESSIONS  1. S/P MV repair with mean gradient 8 mmHg and no MR; s/p AVR with mean gradient 14 mmHg and no AI; s/p TV repair with mild TR.  2. Left ventricular ejection fraction, by estimation, is 60 to 65%. The left ventricle has normal function. The left ventricle has no regional wall motion abnormalities. There is moderate left ventricular hypertrophy. Left ventricular diastolic parameters are indeterminate. There is the interventricular septum is flattened in systole and diastole, consistent with right ventricular pressure and volume overload.  3. Right ventricular  systolic function is normal. The right ventricular size is mildly enlarged. There is severely elevated pulmonary artery systolic pressure.  4. Left atrial size was severely dilated.  5. Right atrial size was severely dilated.  6. The mitral valve has been repaired/replaced. No evidence of mitral valve regurgitation. No evidence of mitral stenosis. There is a prosthetic annuloplasty ring present in the mitral position.  7. The tricuspid valve is has been repaired/replaced. The tricuspid valve is status post repair with an annuloplasty ring.  8. The aortic valve has been repaired/replaced. Aortic valve regurgitation is not visualized. No aortic stenosis is present. There is a bioprosthetic valve present in the aortic position.  9. The inferior vena cava is dilated in size with <50% respiratory variability, suggesting right atrial pressure of 15 mmHg. FINDINGS  Left Ventricle: Left ventricular ejection fraction, by estimation, is 60 to 65%. The left ventricle has normal function. The left ventricle has no regional wall motion abnormalities. Definity contrast agent was given IV to delineate the left ventricular  endocardial borders. The left ventricular internal cavity size was normal in size. There is moderate left ventricular hypertrophy. The interventricular septum is flattened in systole and diastole, consistent with right ventricular pressure and volume overload. Left ventricular diastolic parameters are indeterminate. Right Ventricle: The right ventricular size is mildly enlarged. Right ventricular systolic function is normal. There is severely elevated pulmonary artery systolic pressure. The tricuspid regurgitant velocity is 3.39 m/s, and with an assumed right atrial  pressure of 15 mmHg, the estimated right ventricular systolic pressure is 61.0 mmHg. Left Atrium: Left atrial size was severely dilated. Right Atrium: Right atrial size was severely dilated. Pericardium: There is no evidence of pericardial effusion.  Mitral Valve: The mitral valve has been repaired/replaced. No evidence of mitral valve regurgitation. There is a prosthetic annuloplasty ring present in the mitral position. No evidence of mitral valve stenosis. MV peak gradient, 22.5 mmHg. The mean mitral valve gradient is 8.0 mmHg. Tricuspid Valve: The tricuspid valve is has been repaired/replaced. Tricuspid valve regurgitation is mild . No evidence of tricuspid stenosis. The tricuspid valve is status post repair with an annuloplasty ring. Aortic Valve: The aortic valve has been repaired/replaced. Aortic valve regurgitation is not visualized. No aortic stenosis is present. Aortic valve mean gradient measures 14.2 mmHg. Aortic valve peak gradient measures 17.8 mmHg. Aortic valve area, by VTI measures 1.60 cm. There is a bioprosthetic valve present in the aortic position. Pulmonic Valve: The pulmonic valve was normal in structure. Pulmonic valve regurgitation is mild. No evidence of pulmonic stenosis. Aorta: The aortic root is normal in size and structure. Venous: The inferior vena cava is dilated in size with less than 50% respiratory variability, suggesting right atrial pressure  of 15 mmHg. IAS/Shunts: No atrial level shunt detected by color flow Doppler. Additional Comments: S/P MV repair with mean gradient 8 mmHg and no MR; s/p AVR with mean gradient 14 mmHg and no AI; s/p TV repair with mild TR.  LEFT VENTRICLE PLAX 2D LVIDd:         4.50 cm      Diastology LVIDs:         3.50 cm      LV e' medial:    3.37 cm/s LV PW:         1.20 cm      LV E/e' medial:  59.6 LV IVS:        1.40 cm      LV e' lateral:   7.40 cm/s LVOT diam:     2.10 cm      LV E/e' lateral: 27.2 LV SV:         72 LV SV Index:   36 LVOT Area:     3.46 cm  LV Volumes (MOD) LV vol d, MOD A2C: 120.0 ml LV vol d, MOD A4C: 98.9 ml LV vol s, MOD A2C: 55.1 ml LV vol s, MOD A4C: 41.1 ml LV SV MOD A2C:     64.9 ml LV SV MOD A4C:     98.9 ml LV SV MOD BP:      60.2 ml RIGHT VENTRICLE             IVC RV  Basal diam:  4.65 cm     IVC diam: 3.20 cm RV Mid diam:    4.20 cm RV S prime:     10.30 cm/s TAPSE (M-mode): 1.6 cm LEFT ATRIUM              Index        RIGHT ATRIUM           Index LA diam:        4.70 cm  2.34 cm/m   RA Area:     21.10 cm LA Vol (A2C):   141.0 ml 70.34 ml/m  RA Volume:   54.90 ml  27.39 ml/m LA Vol (A4C):   98.7 ml  49.24 ml/m LA Biplane Vol: 120.0 ml 59.86 ml/m  AORTIC VALVE                     PULMONIC VALVE AV Area (Vmax):    1.99 cm      PR End Diast Vel: 1.85 msec AV Area (Vmean):   1.63 cm AV Area (VTI):     1.60 cm AV Vmax:           211.12 cm/s AV Vmean:          176.800 cm/s AV VTI:            0.452 m AV Peak Grad:      17.8 mmHg AV Mean Grad:      14.2 mmHg LVOT Vmax:         121.00 cm/s LVOT Vmean:        83.000 cm/s LVOT VTI:          0.209 m LVOT/AV VTI ratio: 0.46  AORTA Ao Root diam: 3.30 cm Ao Asc diam:  3.50 cm MITRAL VALVE                TRICUSPID VALVE MV Area (PHT): 1.86 cm     TR Peak grad:   46.0 mmHg MV Area VTI:  1.10 cm     TR Vmax:        339.00 cm/s MV Peak grad:  22.5 mmHg MV Mean grad:  8.0 mmHg     SHUNTS MV Vmax:       2.37 m/s     Systemic VTI:  0.21 m MV Vmean:      132.0 cm/s   Systemic Diam: 2.10 cm MV Decel Time: 408 msec MV E velocity: 201.00 cm/s MV A velocity: 55.60 cm/s MV E/A ratio:  3.62 Olga Millers MD Electronically signed by Olga Millers MD Signature Date/Time: 02/03/2023/3:57:16 PM    Final    DG Abd 1 View Result Date: 02/03/2023 CLINICAL DATA:  Nasogastric tube placement. EXAM: ABDOMEN - 1 VIEW COMPARISON:  Earlier today FINDINGS: The enteric tube has been advanced, the tip and side port are below the diaphragm in the stomach. No bowel dilatation in the upper abdomen. Right upper quadrant surgical clips. IMPRESSION: Enteric tube tip and side port below the diaphragm in the stomach. Electronically Signed   By: Narda Rutherford M.D.   On: 02/03/2023 15:50   DG Abd 1 View Result Date: 02/03/2023 CLINICAL DATA:  Nausea and vomiting  EXAM: ABDOMEN - 1 VIEW; PORTABLE ABDOMEN - 1 VIEW COMPARISON:  None Available. FINDINGS: Sequential images of the upper abdomen demonstrate placement of an esophagogastric tube, tip below the diaphragm, side port above the gastroesophageal junction. Nonobstructive pattern of included bowel gas. No free air on supine radiographs. Cardiomegaly status post median sternotomy with valvular prosthesis and left atrial appendage clip. IMPRESSION: 1. Sequential images of the upper abdomen demonstrate placement of an esophagogastric tube, tip below the diaphragm, side port above the gastroesophageal junction. Recommend advancement. 2. Nonobstructive pattern of included bowel gas. Electronically Signed   By: Jearld Lesch M.D.   On: 02/03/2023 13:22   DG Abd Portable 1V Result Date: 02/03/2023 CLINICAL DATA:  Nausea and vomiting EXAM: ABDOMEN - 1 VIEW; PORTABLE ABDOMEN - 1 VIEW COMPARISON:  None Available. FINDINGS: Sequential images of the upper abdomen demonstrate placement of an esophagogastric tube, tip below the diaphragm, side port above the gastroesophageal junction. Nonobstructive pattern of included bowel gas. No free air on supine radiographs. Cardiomegaly status post median sternotomy with valvular prosthesis and left atrial appendage clip. IMPRESSION: 1. Sequential images of the upper abdomen demonstrate placement of an esophagogastric tube, tip below the diaphragm, side port above the gastroesophageal junction. Recommend advancement. 2. Nonobstructive pattern of included bowel gas. Electronically Signed   By: Jearld Lesch M.D.   On: 02/03/2023 13:22   DG CHEST PORT 1 VIEW Result Date: 02/03/2023 CLINICAL DATA:  Hypoxia, status post aortic valve replacement EXAM: PORTABLE CHEST 1 VIEW COMPARISON:  02/02/2023 FINDINGS: Cardiomegaly status post median sternotomy with aortic valve prosthesis and left atrial appendage clip. Similar retrocardiac atelectasis. Mild diffuse interstitial opacity. No acute osseous  findings. IMPRESSION: 1. Cardiomegaly status post median sternotomy with aortic valve prosthesis and left atrial appendage clip. 2. Similar retrocardiac atelectasis. Mild diffuse interstitial opacity, consistent with edema. Electronically Signed   By: Jearld Lesch M.D.   On: 02/03/2023 12:22   DG Chest Portable 1 View Result Date: 02/02/2023 CLINICAL DATA:  Shortness of breath. EXAM: PORTABLE CHEST 1 VIEW COMPARISON:  02/16/2022 FINDINGS: The cardio pericardial silhouette is enlarged. Diffuse interstitial opacity suggests edema. No focal lung consolidation. No substantial pleural effusion. Bones are diffusely demineralized. Telemetry leads overlie the chest. IMPRESSION: Enlargement of the cardiopericardial silhouette with diffuse interstitial opacity suggesting edema. Electronically Signed  By: Kennith Center M.D.   On: 02/02/2023 08:21   Mr. Raffo was interviewed and examined.  He continues to recover from the admission with urosepsis in the CVA.  The platelet count has recovered to normal.  He has persistent anemia.  The anemia is secondary to sepsis, bleeding, and phlebotomy.  The hemoglobin has recovered into the mild anemia range following orthopedic procedures in the past.  The hemoglobin may improve following definitive management of the ureter stone.  We will follow the hemoglobin as an outpatient.  If the anemia does not correct we will investigate further.  He may have early myelodysplasia or anemia related to chronic blood loss.  Recommendations: 1.  Limit blood draws as possible 2.  Management of ureter stone per urology 3.  Anticoagulation per cardiology and neurology 4.  Outpatient follow-up is scheduled in the hematology clinic 5.  Please call hematology as needed

## 2023-02-19 NOTE — Progress Notes (Addendum)
Speech Language Pathology Treatment: Dysphagia  Patient Details Name: [name]. MRN: [id number] DOB: Jan 03, 1936 [name] Date: 02/19/2023 Time: 0272-5366 SLP Time Calculation (min) (ACUTE ONLY): 44 min  Assessment / Plan / Recommendation Clinical Impression  Pt seen for follow up regarding dysphagia management. He reports he is trying to consume more solids - and tolerating better.  Continues to report issues with being unable to "chug" liquids - and only able to take small sips- Mentions concerns for hydration now that he is off IV fluids.  Pt continues to state that Ensure is easier to swallow than water or sodas= stating the later choke him easily.  SLP then provided pt with OJ and apple juice *with a touch of thickener in it* to determine if it was easier for pt to swallow - however he denied this being the case. Suspect the thickener negatively impacted taste of drink.   Pt notably now can take two sequential swallows of liquids without coughing - - which is different that last week.    Pt recalling to use his RMT trainer - stating he is doing it twice a day - SLP assessed his progression clinically with this apparatus and he was able to advance to 15 cm H20 and conduct 15 per set.   Posted sign in his room with advancement recommendation.   He is not recalling swallowing exercises - therefore only encouraged him to continue RMT.  Demonstrated feedback that device provides him - airflow - by setting device to where pt not successful.  He reported understanding to feedback from device with mod cues.    Pt did report some shortness of breath - his oxygen saturation was 96% of RA approximately five minutes later when SlP checked.  Discussed option of repeating MBS given last test was 10 days ago - this would allow appropriate adaption of exercises and /or compensation strategies.  However pt and SLP decided to defer as pt is compensating well for his dysphagia and trying to consume adequate  po and hydration.    Pt reports he is getting "weaker" and mentions concerns. Advised his family can bring him in food from home that he likes if he is tired of hospital food.  Will continue to follow for dysphagia treatment.  HH SLP advised and SLP printed pt's MBS report.      HPI HPI: 88yo male admitted 02/02/23 with SOB 2/2 CHF exacerbation with possible aspiration event. PMH: acquired dilation of the ascending aorta and aortic root, CAD, AFib, Right basal ganglia CVA (2018), HTN, heart murmur, AVR, OA, BPH, SBO, neuropathy, thrombocytopenia.  PMH+ for dysphagia from CVA 2018 requiring thickener and another CVA that did not impair his swallowing. Pt developed some respiratory difficulties this am - and was placed on 4 L HFNC - s/p surgery with urology stent and then sent from surgery to ICU on vasopressors and Bipap 1/30. Subsequently taken off Bipap and awoke adequately for po. MBS on 02/09/23 recommended full liquids/thin consistency however after returning from [facility], patient with s/s stroke and had MRI brain which showed Small acute left frontal and left temporal white matter infarcts.  Pt is s/p repeat evaluation after cva.  Follow up re: swallowing indicated.      SLP Plan  Continue with current plan of care      Recommendations for follow up therapy are one component of a multi-disciplinary discharge planning process, led by the attending physician.  Recommendations may be updated based on patient status, additional functional  criteria and insurance authorization.    Recommendations  Diet recommendations: Dysphagia 3 (mechanical soft);Thin liquid Liquids provided via: Cup;No straw Medication Administration: Other (Comment) (or with ensure) Supervision: Patient able to self feed Compensations: Slow rate;Small sips/bites (multiple swallows, drink liquids t/o meal) Postural Changes and/or Swallow Maneuvers: Seated upright 90 degrees;Upright 30-60 min after meal                  Oral  care BID     Dysphagia, oropharyngeal phase (R13.12)     Continue with current plan of care   [name], MS [facility] SLP Acute Rehab Services Office [phone]   [name]  02/19/2023, 12:31 PM

## 2023-02-19 NOTE — TOC Progression Note (Addendum)
Transition of Care (TOC) - Progression Note    Patient Details  Name: [name]. MRN: [id number] Date of Birth: 20-Dec-1935  Transition of Care [facility]) CM/SW Contact  Larrie Kass, LCSW Phone Number: 02/19/2023, 8:42 AM  Clinical Narrative:    [facility] has accepted pt for HHPT/OT/Aide and Speech , will need HH orders. TOC to follow for d/c needs.   Expected Discharge Plan: Home w Home Health Services Barriers to Discharge: Continued Medical Work up  Expected Discharge Plan and Services In-house Referral: Clinical Social Work     Living arrangements for the past 2 months: Single Family Home                                       Social Determinants of Health (SDOH) Interventions SDOH Screenings   Food Insecurity: No Food Insecurity (02/03/2023)  Housing: Low Risk  (02/03/2023)  Transportation Needs: No Transportation Needs (02/03/2023)  Utilities: Not At Risk (02/03/2023)  Alcohol Screen: Low Risk  (06/02/2021)  Depression (PHQ2-9): Low Risk  (06/16/2021)  Financial Resource Strain: Low Risk  (06/16/2021)  Social Connections: Unknown (02/03/2023)  Stress: No Stress Concern Present (06/16/2021)  Tobacco Use: Low Risk  (02/07/2023)    Readmission Risk Interventions    02/11/2023    4:00 PM 11/10/2021    3:54 PM  Readmission Risk Prevention Plan  Transportation Screening Complete Complete  PCP or Specialist Appt within 3-5 Days Not Complete Complete  Not Complete comments Patient will discharge to SNF.   HRI or Home Care Consult Complete Complete  Social Work Consult for Recovery Care Planning/Counseling Complete Complete  Palliative Care Screening Not Applicable Not Applicable  Medication Review Oceanographer) Complete Complete

## 2023-02-19 NOTE — Plan of Care (Signed)
  Problem: Education: Goal: Ability to demonstrate management of disease process will improve Outcome: Progressing Goal: Ability to verbalize understanding of medication therapies will improve Outcome: Progressing   Problem: Activity: Goal: Capacity to carry out activities will improve Outcome: Progressing   Problem: Cardiac: Goal: Ability to achieve and maintain adequate cardiopulmonary perfusion will improve Outcome: Progressing   Problem: Education: Goal: Knowledge of General Education information will improve Description: Including pain rating scale, medication(s)/side effects and non-pharmacologic comfort measures Outcome: Progressing   Problem: Health Behavior/Discharge Planning: Goal: Ability to manage health-related needs will improve Outcome: Progressing   Problem: Clinical Measurements: Goal: Ability to maintain clinical measurements within normal limits will improve Outcome: Progressing Goal: Will remain free from infection Outcome: Progressing Goal: Diagnostic test results will improve Outcome: Progressing Goal: Respiratory complications will improve Outcome: Progressing Goal: Cardiovascular complication will be avoided Outcome: Progressing   Problem: Activity: Goal: Risk for activity intolerance will decrease Outcome: Progressing   Problem: Nutrition: Goal: Adequate nutrition will be maintained Outcome: Progressing   Problem: Coping: Goal: Level of anxiety will decrease Outcome: Progressing   Problem: Elimination: Goal: Will not experience complications related to bowel motility Outcome: Progressing Goal: Will not experience complications related to urinary retention Outcome: Progressing   Problem: Pain Managment: Goal: General experience of comfort will improve and/or be controlled Outcome: Progressing   Problem: Safety: Goal: Ability to remain free from injury will improve Outcome: Progressing   Problem: Skin Integrity: Goal: Risk for  impaired skin integrity will decrease Outcome: Progressing   Problem: Education: Goal: Knowledge of disease or condition will improve Outcome: Progressing Goal: Knowledge of secondary prevention will improve (MUST DOCUMENT ALL) Outcome: Progressing Goal: Knowledge of patient specific risk factors will improve (DELETE if not current risk factor) Outcome: Progressing   Problem: Ischemic Stroke/TIA Tissue Perfusion: Goal: Complications of ischemic stroke/TIA will be minimized Outcome: Progressing   Problem: Coping: Goal: Will verbalize positive feelings about self Outcome: Progressing Goal: Will identify appropriate support needs Outcome: Progressing   Problem: Health Behavior/Discharge Planning: Goal: Ability to manage health-related needs will improve Outcome: Progressing Goal: Goals will be collaboratively established with patient/family Outcome: Progressing   Problem: Self-Care: Goal: Ability to participate in self-care as condition permits will improve Outcome: Progressing Goal: Verbalization of feelings and concerns over difficulty with self-care will improve Outcome: Progressing Goal: Ability to communicate needs accurately will improve Outcome: Progressing   Problem: Nutrition: Goal: Risk of aspiration will decrease Outcome: Progressing Goal: Dietary intake will improve Outcome: Progressing

## 2023-02-20 DIAGNOSIS — A4159 Other Gram-negative sepsis: Secondary | ICD-10-CM | POA: Diagnosis not present

## 2023-02-20 DIAGNOSIS — N201 Calculus of ureter: Secondary | ICD-10-CM | POA: Diagnosis not present

## 2023-02-20 DIAGNOSIS — J9601 Acute respiratory failure with hypoxia: Secondary | ICD-10-CM | POA: Diagnosis not present

## 2023-02-20 LAB — CBC WITH DIFFERENTIAL/PLATELET
Abs Immature Granulocytes: 0.02 10*3/uL (ref 0.00–0.07)
Basophils Absolute: 0 10*3/uL (ref 0.0–0.1)
Basophils Relative: 0 %
Eosinophils Absolute: 0.2 10*3/uL (ref 0.0–0.5)
Eosinophils Relative: 3 %
HCT: 25.1 % — ABNORMAL LOW (ref 39.0–52.0)
Hemoglobin: 7.6 g/dL — ABNORMAL LOW (ref 13.0–17.0)
Immature Granulocytes: 0 %
Lymphocytes Relative: 21 %
Lymphs Abs: 1.2 10*3/uL (ref 0.7–4.0)
MCH: 28.8 pg (ref 26.0–34.0)
MCHC: 30.3 g/dL (ref 30.0–36.0)
MCV: 95.1 fL (ref 80.0–100.0)
Monocytes Absolute: 0.6 10*3/uL (ref 0.1–1.0)
Monocytes Relative: 11 %
Neutro Abs: 3.6 10*3/uL (ref 1.7–7.7)
Neutrophils Relative %: 65 %
Platelets: 213 10*3/uL (ref 150–400)
RBC: 2.64 MIL/uL — ABNORMAL LOW (ref 4.22–5.81)
RDW: 17.8 % — ABNORMAL HIGH (ref 11.5–15.5)
WBC: 5.6 10*3/uL (ref 4.0–10.5)
nRBC: 0 % (ref 0.0–0.2)

## 2023-02-20 LAB — COMPREHENSIVE METABOLIC PANEL
ALT: 15 U/L (ref 0–44)
AST: 17 U/L (ref 15–41)
Albumin: 2.3 g/dL — ABNORMAL LOW (ref 3.5–5.0)
Alkaline Phosphatase: 75 U/L (ref 38–126)
Anion gap: 8 (ref 5–15)
BUN: 15 mg/dL (ref 8–23)
CO2: 22 mmol/L (ref 22–32)
Calcium: 8 mg/dL — ABNORMAL LOW (ref 8.9–10.3)
Chloride: 109 mmol/L (ref 98–111)
Creatinine, Ser: 0.82 mg/dL (ref 0.61–1.24)
GFR, Estimated: >60 mL/min (ref >60)
Glucose, Bld: 105 mg/dL — ABNORMAL HIGH (ref 70–99)
Potassium: 3.4 mmol/L — ABNORMAL LOW (ref 3.5–5.1)
Sodium: 139 mmol/L (ref 135–145)
Total Bilirubin: 1.1 mg/dL (ref 0.0–1.2)
Total Protein: 6 g/dL — ABNORMAL LOW (ref 6.5–8.1)

## 2023-02-20 LAB — GLUCOSE, CAPILLARY
Glucose-Capillary: 101 mg/dL — ABNORMAL HIGH (ref 70–99)
Glucose-Capillary: 92 mg/dL (ref 70–99)
Glucose-Capillary: 95 mg/dL (ref 70–99)

## 2023-02-20 LAB — PROTIME-INR
INR: 1.9 — ABNORMAL HIGH (ref 0.8–1.2)
Prothrombin Time: 22.2 s — ABNORMAL HIGH (ref 11.4–15.2)

## 2023-02-20 LAB — PHOSPHORUS: Phosphorus: 3.3 mg/dL (ref 2.5–4.6)

## 2023-02-20 LAB — MAGNESIUM: Magnesium: 2 mg/dL (ref 1.7–2.4)

## 2023-02-20 MED ORDER — INSULIN ASPART 100 UNIT/ML IJ SOLN: 0.0000 [IU] | Freq: Three times a day (TID) | INTRAMUSCULAR | Status: DC

## 2023-02-20 MED ORDER — POTASSIUM CHLORIDE CRYS ER 20 MEQ PO TBCR
40.0000 meq | EXTENDED_RELEASE_TABLET | Freq: Once | ORAL | Status: AC
  Administered 2023-02-20: 40 meq via ORAL
  Filled 2023-02-20: qty 2

## 2023-02-20 MED ORDER — WARFARIN SODIUM 5 MG PO TABS
7.5000 mg | ORAL_TABLET | Freq: Once | ORAL | Status: AC
  Administered 2023-02-20: 7.5 mg via ORAL
  Filled 2023-02-20: qty 1

## 2023-02-20 NOTE — Consult Note (Signed)
[facility] Liaison Consult Note   02/20/2023  [name]. October 20, 1935 [id number]  Insurance: [place] [employer] Medicare  PCP: Jarrett Soho, PA-C with [facility], confirmed  *[facility] Liaison remote review follow up for patient at [facility]   Follow up: 18 day LLOS, High risk for unplanned readmission Patient at [facility], call to patient at bedside, HIPAA verified, and explained to patient for post hospital follow up needs.    Patient states he is going to make a follow up appointment with provider and he is for DC home with home health today.  Plan:  No other needs assessed, will update [facility] T[facility] team of disposition.  For questions,  Charlesetta Shanks, RN, BSN, CCM [place]  [facility], [facility] Liaison Direct Dial: [phone] or secure chat Email: [email or web address]

## 2023-02-20 NOTE — Discharge Summary (Incomplete Revision)
Physician Discharge Summary   [name]. WUJ:[id number] DOB: 1935/02/04 DOA: 02/02/2023  PCP: Jarrett Soho, PA-C  Admit date: 02/02/2023 Discharge date: 02/20/2023  Admitted From: Home Disposition:  Home with HH Discharging physician: Lewie Chamber, MD Barriers to discharge:   Recommendations at discharge: Follow up with urology    Discharge Condition: stable CODE STATUS: Full  Diet recommendation:  Diet Orders (From admission, onward)     Start     Ordered   02/15/23 1638  DIET DYS 3 Fluid consistency: Thin  Diet effective now       Question:  Fluid consistency:  Answer:  Thin   02/15/23 1638            Hospital Course: [name]. is a 88 y.o. male who has an extensive PMH as below, significant for CAD, CVA, aortic valve replacement 2012, hypertension, pulmonary hypertension, atrial fibrillation and small bowel obstruction. He presented to [facility] ED 1/25 with dyspnea that woke him from his sleep the night prior. He had also complained of LE edema over the preceding few weeks. On EMS arrival, he had sats of 85% on room air. He was placed on [place] O2 and given Solumedrol and DuoNebs in route to ED.   In ED, CXR was suggestive of mild edema. He was admitted by [facility] and started on diuresis. He was seen by cardiology in consultation who agreed with diuresis for fluid overload and obtaining an echo. Echo was obtained and demonstrated EF 60-65%, mod LVH, RV pressure overload with RVSP 61, LA and RA severely dilated.   On evening of 1/26, he developed nausea and vomiting with probable aspiration event. There was concern for SBO and was seen by general surgery who recommended continuing NPO and continuing NGT to suction.   On 1/27, diuresis was held due to bump in renal function after IV lasix. He was only net negative since admit. He did not appear to have excess volume per cardiology notes and appeared improved from CHF standpoint. Dyspnea and hypoxia were  felt to more likely be related to aspiration PNA.  General surgery had also seen and he had benign exam and was passing flatus. He unfortunately pulled out his NGT that day.   After pulling out NGT 1/27, he had episode of tachypnea and increased WOB along with spike in temp to 102.44F. Due to increased WOB, PCCM asked to see in consultation. He had been on 4L [place] prior to this episode.   Blood cultures from admission returned positive for Enterobacter cloacae. He was on Cefepime escalated to Meropenem.  Family report concerns of pt aspirating for over a week prior to admit. Further GOC clarified with pt's son, [name], changed to DNR/ DNI.    CT scan on 1/29 showed mild right hydro with 12 mm right proximal ureteral stone.  Urology was c/s and he's now s/p cystoscopy with R ureteral stent placement.   CT also showed hemorrhage within the dominant 8.5 cm cyst in the inferior pole of the right kidney as well as a small intramuscular bleed within the left iliopsoas.    He was found to have new facial droop and L sided weakness and altered speech on 2/1.  MRI showed acute L frontal and L temporal white matter infarcts.  He was seen by neurology.    Anticoagulation was held.  A repeat head CT on 2/5 showed no evidence of hemorrhagic conversion.  Heparin was restarted on 2/5 PM.  He's completed  antibiotics for his enterococcus bacteremia.  He appears stable with reinitiation of his warfarin, his INR is at goal.  Hgb remained stable at discharge also.    Assessment & Plan:   Principal Problem:   Acute respiratory failure with hypoxia (HCC) Active Problems:   Essential hypertension   Permanent atrial fibrillation (HCC)   CAD (coronary artery disease)   s/p mitral valve repair   Anemia due to chronic blood loss   Benign prostatic hyperplasia with lower urinary tract symptoms   Thrombocytopenia (HCC)   Gout   Hypokalemia   Left hemiparesis (HCC)   S/P AVR (aortic valve replacement)    History of ischemic right MCA stroke   Hyperglycemia   S/P tricuspid valve repair   S/P Maze operation for atrial fibrillation   Prediabetes   Protein-calorie malnutrition, moderate (HCC)   HLD (hyperlipidemia)   Other secondary pulmonary hypertension (HCC)   Elevated troponin   Volume overload   Aspiration pneumonia (HCC)   DNR (do not resuscitate)   Ureteral calculus, right   Nephrolithiasis   Sepsis due to Enterobacter [facility])   Renal cyst    Acute ischemic stroke: Thought to be related to FFP/platelet infusion MRI brain on 2/1 showed small acute left frontal and left temporal white matter infarct. MR angio showed severe stenosis of right P2 PCA Neurology follow up appreciated -> 02/10/2023 note Repeat head CT without evidence of hemorrhagic conversion  Seen by SLP- dysphagia 2 diet. He's now on warfarin    Acute hypoxic respiratory failure-multifactorial in the setting of aspiration pneumonia, fluid overload/CHF exacerbation: 02/09/2023: Off of oxygen. Continue aspiration precautions.   Continue pulmonary hygiene. Dysphagia diet.   Enterobacter Colace bacteremia in the setting of UTI  right ureteral stone with mild hydronephrosis: Septic shock-resolved 1/30 was not a candidate for percutaneous  nephrostomy tube given elevated INR and thrombocytopenia.  S/p cystoscopy with right ureteral stent placement 1/30 Off of pressors now.  Fever improved. WBC improved. Cefepime changed to Meropenem due to encephalopathy. Continue antibiotics for total 14 days (antibiotics now complete). He has PICC line placed 02/05/23; removed prior to discharge  1 of 2 cultures from 1/29 with staph epidermidis, suspect contaminant   Indwelling Foley Catheter He's on flomax and finasteride - continue foley at discharge with outpt followup with urology    Left iliopsoas intramuscular bleed Right renal cyst with hemorrhage  LUE Ecchymoses Discussed renal cyst hemorrhage with urology, ok with  resuming anticoagulation   Ileus  NGT removed 1/27, passing flatus Rectal tube is no longer in place   Hypernatremia: improved   AKI: Baseline creatinine 0.9.  Worsened after IV diuresis and shock.  Mild right hydronephrosis with right renal calculi.  Status post stent placement on 1/30 by urology.  Renal function improved.   Afib - on coumadin Valvular disease s/p bioprosthetic AVR 2012, w/ MV and TV repair  CAD, non-obstructive  HFpEF: Remain euvolemic.  Continue to hold on beta-blocker and Coumadin.  Cardiology signed off on 02/06/2023.   Anemia of chronic disease Acute on chronic Thrombocytopenia Coagulapathy on coumadin  S/p 2 units platelets, 1 unit FFP, 4 units pRBC's  Transfuse additional unit pRBC today With repeat head CT without evidence of hemorrhagic conversion Cardiology notably has recommended heparin bridge for INR <2 Per oncology, anemia thought due to sepsis, bleeding, renal failure, and phlebotomy - Hb expected to improve over next several weeks Dr. Truett Perna -> planning for outpatient follow up (may have early MDS vs anemia related  to chronic blood loss), he notes it'll take weeks for Hb to recover   Moderate protein calorie malnutrition:  Dietitian on board Encourage oral diet, supplements.   Hyperglycemia A1c 6.2 SSI   The patient's acute and chronic medical conditions were treated accordingly. On day of discharge, patient was felt deemed stable for discharge. Patient/family member advised to call PCP or come back to ER if needed.   Principal Diagnosis: Acute respiratory failure with hypoxia [facility])  Discharge Diagnoses: Active Hospital Problems   Diagnosis Date Noted   Acute respiratory failure with hypoxia (HCC) 02/02/2023   Essential hypertension     Priority: 4.   CAD (coronary artery disease) 12/19/2020    Priority: 5.   Permanent atrial fibrillation (HCC)     Priority: 5.   s/p mitral valve repair 01/30/2018    Priority: 7.   Anemia due to  chronic blood loss 09/05/2018    Priority: 8.   Benign prostatic hyperplasia with lower urinary tract symptoms     Priority: 9.   Thrombocytopenia (HCC) 05/26/2020    Priority: 10.   Ureteral calculus, right 02/06/2023   Nephrolithiasis 02/06/2023   Sepsis due to Enterobacter (HCC) 02/06/2023   Renal cyst 02/06/2023   Aspiration pneumonia (HCC) 02/04/2023   DNR (do not resuscitate) 02/04/2023   Elevated troponin 02/02/2023   Volume overload 02/02/2023   Other secondary pulmonary hypertension (HCC) 03/20/2022   HLD (hyperlipidemia) 09/23/2021   Protein-calorie malnutrition, moderate (HCC) 08/02/2020   Prediabetes 05/26/2020   S/P tricuspid valve repair 01/30/2018   S/P Maze operation for atrial fibrillation 01/30/2018   Hyperglycemia    History of ischemic right MCA stroke 10/29/2016   S/P AVR (aortic valve replacement)    Hypokalemia    Left hemiparesis (HCC)    Gout     Resolved Hospital Problems  No resolved problems to display.     Discharge Instructions     Increase activity slowly   Complete by: As directed    No wound care   Complete by: As directed       Allergies as of 02/20/2023       Reactions   Sulfa Antibiotics Other (See Comments)   Granulocytosis   Sulfamethoxazole-trimethoprim    Other Reaction(s): Unknown   Zestril [lisinopril] Cough        Medication List     TAKE these medications    allopurinol 300 MG tablet Commonly known as: ZYLOPRIM Take 150 mg by mouth every morning.   amLODipine 10 MG tablet Commonly known as: NORVASC TAKE 1 TABLET BY MOUTH DAILY   atenolol 25 MG tablet Commonly known as: TENORMIN TAKE ONE-HALF TABLET BY MOUTH  DAILY   Centrum Silver Adult 50+ Tabs Take 1 tablet by mouth daily.   cholecalciferol 25 MCG (1000 UNIT) tablet Commonly known as: VITAMIN D3 Take 1,000 Units by mouth daily.   Cinnamon 500 MG capsule Take 1,500 mg by mouth every morning.   ferrous sulfate 325 (65 FE) MG EC tablet Take 325  mg by mouth 2 (two) times daily.   finasteride 5 MG tablet Commonly known as: PROSCAR Take 1 tablet (5 mg total) by mouth daily.   rosuvastatin 10 MG tablet Commonly known as: CRESTOR TAKE 1 TABLET BY MOUTH DAILY   silodosin 8 MG Caps capsule Commonly known as: RAPAFLO Take 8 mg by mouth daily.   traZODone 50 MG tablet Commonly known as: DESYREL Take 25 mg by mouth at bedtime as needed for sleep.   warfarin 5  MG tablet Commonly known as: COUMADIN Take as directed. If you are unsure how to take this medication, talk to your nurse or doctor. Original instructions: TAKE 1 TO 1 AND 1/2 TABLETS BY  MOUTH DAILY OR AS DIRECTED BY  ANTICOAGULATION CLINIC What changed: See the new instructions.        Follow-up Information     [place], [facility] Follow up.   Contact information: [address] [place] [address] [phone]                Allergies  Allergen Reactions   Sulfa Antibiotics Other (See Comments)    Granulocytosis   Sulfamethoxazole-Trimethoprim     Other Reaction(s): Unknown   Zestril [Lisinopril] Cough    Consultations: Urology   Procedures:   Discharge Exam: BP (!) 115/46 (BP Location: Left Arm)   Pulse (!) 42   Temp 98.3 F (36.8 C) (Oral)   Resp 14   Ht 6' (1.829 m)   Wt 75.8 kg   SpO2 97%   BMI 22.66 kg/m  Physical Exam Constitutional:      General: He is not in acute distress.    Appearance: Normal appearance.     Comments: Pleasant and elderly; NAD  HENT:     Head: Normocephalic and atraumatic.     Mouth/Throat:     Mouth: Mucous membranes are moist.  Eyes:     Extraocular Movements: Extraocular movements intact.  Cardiovascular:     Rate and Rhythm: Normal rate and regular rhythm.  Pulmonary:     Effort: Pulmonary effort is normal. No respiratory distress.     Breath sounds: Normal breath sounds. No wheezing.  Abdominal:     General: Bowel sounds are normal. There is no distension.      Palpations: Abdomen is soft.     Tenderness: There is no abdominal tenderness.  Genitourinary:    Comments: Foley in place with dark yellow urine in bag Musculoskeletal:        General: Normal range of motion.     Cervical back: Normal range of motion and neck supple.  Skin:    General: Skin is warm and dry.     Findings: Bruising (scattered, worse in LUE) present.  Neurological:     General: No focal deficit present.     Mental Status: He is alert.  Psychiatric:        Mood and Affect: Mood normal.        Behavior: Behavior normal.      The results of significant diagnostics from this hospitalization (including imaging, microbiology, ancillary and laboratory) are listed below for reference.   Microbiology: No results found for this or any previous visit (from the past 240 hours).   Labs: BNP (last 3 results) Recent Labs    02/02/23 0806 02/04/23 1629 02/10/23 0453  BNP 432.5* 1,032.1* 349.4*   Basic Metabolic Panel: Recent Labs  Lab 02/16/23 0453 02/17/23 0428 02/18/23 0353 02/19/23 0335 02/20/23 0410  NA 148* 146* 141 140 139  K 3.6 3.3* 3.3* 3.5 3.4*  CL 121* 118* 113* 110 109  CO2 20* 22 22 22 22   GLUCOSE 102* 110* 118* 104* 105*  BUN 16 15 12 15 15   CREATININE 0.93 0.75 0.75 0.71 0.82  CALCIUM 8.5* 8.0* 7.8* 7.9* 8.0*  MG 2.0 1.9 1.9 1.8 2.0  PHOS 2.4* 2.7 2.7 2.8 3.3   Liver Function Tests: Recent Labs  Lab 02/16/23 9147 02/17/23 0428 02/18/23 0353 02/19/23 0335  02/20/23 0410  AST 28 24 23 22 17   ALT 24 22 20 19 15   ALKPHOS 95 92 81 82 75  BILITOT 1.3* 1.2 1.1 1.1 1.1  PROT 6.2* 5.9* 5.5* 5.8* 6.0*  ALBUMIN 2.3* 2.2* 2.0* 2.2* 2.3*   No results for input(s): "LIPASE", "AMYLASE" in the last 168 hours. No results for input(s): "AMMONIA" in the last 168 hours. CBC: Recent Labs  Lab 02/16/23 0453 02/16/23 2107 02/17/23 0428 02/18/23 0353 02/19/23 0335 02/20/23 0410  WBC 8.7  --  7.4 7.1 6.5 5.6  NEUTROABS 6.2 5.6 5.0 4.7 4.2 3.6  HGB  8.3*  --  7.9* 7.8* 7.7* 7.6*  HCT 26.5*  --  25.3* 24.6* 24.8* 25.1*  MCV 93.0  --  93.0 92.8 92.9 95.1  PLT 163  --  189 197 223 213   Cardiac Enzymes: No results for input(s): "CKTOTAL", "CKMB", "CKMBINDEX", "TROPONINI" in the last 168 hours. BNP: Invalid input(s): "POCBNP" CBG: Recent Labs  Lab 02/19/23 1656 02/19/23 2006 02/20/23 0107 02/20/23 0442 02/20/23 0728  GLUCAP 99 138* 101* 92 95   D-Dimer No results for input(s): "DDIMER" in the last 72 hours. Hgb A1c No results for input(s): "HGBA1C" in the last 72 hours. Lipid Profile No results for input(s): "CHOL", "HDL", "LDLCALC", "TRIG", "CHOLHDL", "LDLDIRECT" in the last 72 hours. Thyroid function studies No results for input(s): "TSH", "T4TOTAL", "T3FREE", "THYROIDAB" in the last 72 hours.  Invalid input(s): "FREET3" Anemia work up No results for input(s): "VITAMINB12", "FOLATE", "FERRITIN", "TIBC", "IRON", "RETICCTPCT" in the last 72 hours. Urinalysis    Component Value Date/Time   COLORURINE YELLOW 02/04/2023 0118   APPEARANCEUR CLOUDY (A) 02/04/2023 0118   LABSPEC 1.010 02/04/2023 0118   PHURINE 5.0 02/04/2023 0118   GLUCOSEU NEGATIVE 02/04/2023 0118   HGBUR LARGE (A) 02/04/2023 0118   BILIRUBINUR NEGATIVE 02/04/2023 0118   KETONESUR NEGATIVE 02/04/2023 0118   PROTEINUR NEGATIVE 02/04/2023 0118   UROBILINOGEN 0.2 08/01/2010 0924   NITRITE NEGATIVE 02/04/2023 0118   LEUKOCYTESUR LARGE (A) 02/04/2023 0118   Sepsis Labs Recent Labs  Lab 02/17/23 0428 02/18/23 0353 02/19/23 0335 02/20/23 0410  WBC 7.4 7.1 6.5 5.6   Microbiology No results found for this or any previous visit (from the past 240 hours).  Procedures/Studies: CT HEAD WO CONTRAST ( ) Result Date: 02/13/2023 CLINICAL DATA:  Provided history: Stroke, follow-up. EXAM: CT HEAD WITHOUT CONTRAST TECHNIQUE: Contiguous axial images were obtained from the base of the skull through the vertex without intravenous contrast. RADIATION DOSE REDUCTION:  This exam was performed according to the departmental dose-optimization program which includes automated exposure control, adjustment of the mA and/or kV according to patient size and/or use of iterative reconstruction technique. COMPARISON:  Brain MRI 02/09/2023.  Noncontrast head CT 02/09/2023. FINDINGS: Brain: Generalized cerebral atrophy. Known small acute white matter infarct within the posterior left frontal lobe. A known small acute white matter infarct within the posterior left temporal lobe is occult by CT and was better appreciated on the recent prior brain MRI of 02/09/2023. Chronic right MCA territory and right cerebellar infarcts again noted. Mild patchy and ill-defined hypoattenuation elsewhere within the cerebral white matter, nonspecific but compatible chronic small vessel disease. There is no acute intracranial hemorrhage. No evidence of an intracranial mass. No midline shift. Vascular: No hyperdense vessel.  Atherosclerotic calcifications. Skull: No calvarial fracture or aggressive osseous lesion. Sinuses/Orbits: No mass or acute finding within the imaged orbits. No significant paranasal sinus disease at the imaged levels. Other: Right mastoid effusion. IMPRESSION:  1. Known small acute white matter infarcts within the left frontal and left temporal lobes, not appreciably changed in extent from the recent prior brain MRI of 02/09/2023. No evidence of hemorrhagic conversion. 2. Parenchymal atrophy, chronic small vessel ischemic disease and chronic infarcts, as described. 3. Right mastoid effusion. Electronically Signed   By: Jackey Loge D.O.   On: 02/13/2023 12:37   DG Swallowing Func-Speech Pathology Result Date: 02/12/2023 Table formatting from the original result was not included. Clinical Impressions: Pt presents with a characterized by reduced strength and coordination. Participation was significantly limited by fatigue, mentation, and hesistance to potential gagging. He declined to use  straws or take sequential sips, so thin liquids were assessed through single cup sips. Due to incomplete epiglottic inversion and partial laryngeal elevation, thin liquids consistently result in penetration above the level of the vocal folds that does not spontaneously clear (PAS 3). He initiates effective throat clearance as it progresses, indicating intact sensation. After multiple subswallows, penetrates are eventually expelled. There is diffuse pharyngeal residue that increases with thickened liquid consistencies and especially solids. Chin tuck and L head turn postures are ineffective in preventing this penetration or reducing residue. A liquid wash does clear vallecular residue, although any liquid results in a collection of residue in pt's pyriform sinuses. Pt states preference to continue full liquid diet at present but feel that as he is willing, diet could be advanced to modified texture solids. No significant esophageal retention or backflow was noted, although suspected cervical osteophytes contribute to reduced distention at the level of the PES. Recommend continuing full liquid diet as that is with what pt feels most comfortable. He may benefit from ongoing education and intervention to maintain his strength and coordination. SLP will continue following. Recommendations: Full Liquid Diet  02/09/23 1148 SLP Visit Information SLP Received On 02/09/23 Pain Assessment Pain Assessment Faces Faces Pain Scale 4 Pain Location generalized Pain Descriptors / Indicators Discomfort;Grimacing Pain Intervention(s) Monitored during session General Information HPI 87yo male admitted 02/02/23 with SOB 2/2 CHF exacerbation with possible aspiration event. PMH: acquired dilation of the ascending aorta and aortic root, CAD, AFib, Right basal ganglia CVA (2018), HTN, heart murmur, AVR, OA, BPH, SBO, neuropathy, thrombocytopenia.  PMH+ for dysphagia from CVA 2018 requiring thickener and another CVA that did not impair his  swallowing. Pt developed some respiratory difficulties this am - and was placed on 4 L HFNC - s/p surgery with urology stent and then sent from surgery to ICU on vasopressors and Bipap 1/30. Subsequently taken off Bipap and awoke adequately for po. Caregiver present No Diet Prior to this Study Full liquid diet Temperature  Normal Respiratory Status Tachypneic Supplemental O2 None (Room air) History of Recent Intubation No Behavior/Cognition Alert;Cooperative;Pleasant mood Self-Feeding Abilities Dependent for feeding Baseline vocal quality/speech Normal Volitional Cough Able to elicit Volitional Cough Assessment Appears WFL Volitional Swallow Able to elicit Anatomy Suspected cervical osteophytes Orofacial Exam Oral Cavity: Oral Hygiene WFL Oral Cavity - Dentition Adequate natural dentition;Missing dentition Orofacial Anatomy WFL Oral Motor/Sensory Function WFL Boluses Administered Boluses Administered Thin liquids (Level 0);Mildly thick liquids (Level 2, nectar thick);Puree;Solid Oral Impairment Domain Lip Closure Interlabial escape, no progression to anterior lip Tongue control during bolus hold Cohesive bolus between tongue to palatal seal Bolus preparation/mastication Timely and efficient chewing and mashing Bolus transport/lingual motion Brisk tongue motion Oral residue Trace residue lining oral structures Location of oral residue  Tongue;Palate Initiation of pharyngeal swallow  Pyriform sinuses Pharyngeal Impairment Domain Soft palate elevation No bolus  between soft palate (SP)/pharyngeal wall (PW) Laryngeal elevation Partial superior movement of thyroid cartilage/partial approximation of arytenoids to epiglottic petiole Anterior hyoid excursion Partial anterior movement Epiglottic movement Partial inversion Laryngeal vestibule closure Incomplete, narrow column air/contrast in laryngeal vestibule Pharyngeal stripping wave  Present - diminished Pharyngeal contraction (A/P view only) N/A Pharyngoesophageal segment  opening Partial distention/partial duration, partial obstruction of flow Tongue base retraction Trace column of contrast or air between tongue base and PPW Pharyngeal residue Majority of contrast within or on pharyngeal structures Location of pharyngeal residue Tongue base;Valleculae;Pyriform sinuses;Pharyngeal wall;Diffuse (>3 areas) Esophageal Impairment Domain Esophageal clearance upright position Complete clearance, esophageal coating Penetration/Aspiration Scale Score 1.  Material does not enter airway Puree;Solid 3.  Material enters airway, remains ABOVE vocal cords and not ejected out Thin liquids (Level 0) Compensatory Strategies Compensatory strategies Yes Chin tuck Ineffective Ineffective Chin Tuck Thin liquid (Level 0) Left head turn Ineffective Ineffective Left Head Turn Thin liquid (Level 0) Clinical Impression Clinical Impression Pt presents with a characterized by reduced strength and coordination. Participation was significantly limited by fatigue, mentation, and hesistance to potential gagging. He declined to use straws or take sequential sips, so thin liquids were assessed through single cup sips. Due to incomplete epiglottic inversion and partial laryngeal elevation, thin liquids consistently result in penetration above the level of the vocal folds that does not spontaneously clear (PAS 3). He initiates effective throat clearance as it progresses, indicating intact sensation. After multiple subswallows, penetrates are eventually expelled. There is diffuse pharyngeal residue that increases with thickened liquid consistencies and especially solids. Chin tuck and L head turn postures are ineffective in preventing this penetration or reducing residue. A liquid wash does clear vallecular residue, although any liquid results in a collection of residue in pt's pyriform sinuses. Pt states preference to continue full liquid diet at present but feel that as he is willing, diet could be advanced to modified  texture solids. No significant esophageal retention or backflow was noted, although suspected cervical osteophytes contribute to reduced distention at the level of the PES. Recommend continuing full liquid diet as that is with what pt feels most comfortable. He may benefit from ongoing education and intervention to maintain his strength and coordination. SLP will continue following. SLP Visit Diagnosis Dysphagia, oropharyngeal phase (R13.12) Factors that may increase risk of adverse event in presence of aspiration [employer] 2021) Poor general health and/or compromised immunity;Reduced cognitive function;Frail or deconditioned;Dependence for feeding and/or oral hygiene;Inadequate oral hygiene;Reduced saliva Exam Limitations Poor participation;Poor bolus acceptance;Fatigue Swallowing Evaluation Recommendations Recommendations PO diet PO Diet Recommendation Full liquid diet Liquid Administration via Spoon;Cup Medication Administration Whole meds with puree (or continue with Ensure) Supervision Full assist for feeding;Full supervision/cueing for swallowing strategies Swallowing strategies   Minimize environmental distractions;Slow rate;Small bites/sips;Multiple dry swallows after each bite/sip;Follow solids with liquids Postural changes Position pt fully upright for meals;Stay upright 30-60 min after meals Oral care recommendations Oral care BID (2x/day) Treatment Plan Treatment recommendations Therapy as outlined in treatment plan below Follow-up recommendations Outpatient SLP Functional status assessment Patient has had a recent decline in their functional status and demonstrates the ability to make significant improvements in function in a reasonable and predictable amount of time. Treatment frequency Min 2x/week Treatment duration 2 weeks Interventions Aspiration precaution training;Oropharyngeal exercises;Patient/family education;Trials of upgraded texture/liquids;Diet toleration management by SLP Goal  Planning Prognosis for improved oropharyngeal function Fair Barriers to Reach Goals Cognitive deficits;Time post onset Patient/Family Stated Goal for pt to get better Consulted and agree with  results and recommendations Patient SLP Time Calculation SLP Start Time (ACUTE ONLY) 1117 SLP Stop Time (ACUTE ONLY) 1142 SLP Time Calculation (min) (ACUTE ONLY) 25 min SLP Evaluations $ SLP Speech Visit 1 Visit SLP Evaluations $MBS Swallow 1 Procedure   VAS US CAROTID Result Date: 02/10/2023 Carotid Arterial Duplex Study Patient Name:  Gerald Vi.  Date of Exam:   02/10/2023 Medical Rec #: [id number]              Accession #:    [id number] Date of Birth: [birth year]              Patient Gender: M Patient Age:   12 years Exam Location:  [facility] Procedure:      VAS US CAROTID Referring Phys: Brooke Dare --------------------------------------------------------------------------------  Indications:       CVA. Risk Factors:      Hypertension, coronary artery disease, prior CVA. Other Factors:     AVR 2012, afib, pulmonary hypertension, mitral and tricuspid                    valve repair. Comparison Study:  Prior carotid duplex done 01/20/18 indicating 1-39% ICA                    stenosis, bilaterally Performing Technologist: Fernande Bras  Examination Guidelines: A complete evaluation includes B-mode imaging, spectral Doppler, color Doppler, and power Doppler as needed of all accessible portions of each vessel. Bilateral testing is considered an integral part of a complete examination. Limited examinations for reoccurring indications may be performed as noted.  Right Carotid Findings: +----------+--------+--------+--------+------------------+------------------+           PSV cm/sEDV cm/sStenosisPlaque DescriptionComments           +----------+--------+--------+--------+------------------+------------------+ CCA Prox  161     28                                intimal thickening  +----------+--------+--------+--------+------------------+------------------+ CCA Distal116     14                                intimal thickening +----------+--------+--------+--------+------------------+------------------+ ICA Prox  101     23      1-39%   heterogenous                         +----------+--------+--------+--------+------------------+------------------+ ICA Mid   97      22                                                   +----------+--------+--------+--------+------------------+------------------+ ICA Distal74      19                                                   +----------+--------+--------+--------+------------------+------------------+ ECA       118     12                                                   +----------+--------+--------+--------+------------------+------------------+ +----------+--------+-------+--------+-------------------+  PSV cm/sEDV cmsDescribeArm Pressure (mmHG) +----------+--------+-------+--------+-------------------+ [id number]      18                                 +----------+--------+-------+--------+-------------------+ +---------+--------+---+--------+--+ VertebralPSV cm/s104EDV cm/s21 +---------+--------+---+--------+--+  Left Carotid Findings: +----------+--------+--------+--------+------------------+------------------+           PSV cm/sEDV cm/sStenosisPlaque DescriptionComments           +----------+--------+--------+--------+------------------+------------------+ CCA Prox  115     17                                intimal thickening +----------+--------+--------+--------+------------------+------------------+ CCA Distal112     22                                intimal thickening +----------+--------+--------+--------+------------------+------------------+ ICA Prox  134     26      1-39%   heterogenous                          +----------+--------+--------+--------+------------------+------------------+ ICA Mid   125     24                                                   +----------+--------+--------+--------+------------------+------------------+ ICA Distal122     32                                                   +----------+--------+--------+--------+------------------+------------------+ ECA       138     12                                                   +----------+--------+--------+--------+------------------+------------------+ +----------+--------+--------+--------+-------------------+           PSV cm/sEDV cm/sDescribeArm Pressure (mmHG) +----------+--------+--------+--------+-------------------+ [id number]     19                                  +----------+--------+--------+--------+-------------------+ +---------+--------+--+--------+--+ VertebralPSV cm/s74EDV cm/s15 +---------+--------+--+--------+--+   Summary: Right Carotid: Velocities in the right ICA are consistent with a 1-39% stenosis. Left Carotid: Velocities in the left ICA are consistent with a 1-39% stenosis. Vertebrals:  Bilateral vertebral arteries demonstrate antegrade flow. Subclavians: Normal flow hemodynamics were seen in bilateral subclavian              arteries. *See table(s) above for measurements and observations.  Electronically signed by Lemar Livings MD on 02/10/2023 at 4:01:14 PM.    Final    DG CHEST PORT 1 VIEW Result Date: 02/10/2023 CLINICAL DATA:  Respiratory failure EXAM: PORTABLE CHEST 1 VIEW COMPARISON:  02/05/2023 FINDINGS: Right-sided PICC line terminates at the superior caval/atrial junction. The low left hemithorax is minimally excluded. Median sternotomy for aortic valve repair. Left atrial appendage occlusion device. Midline trachea. Mild cardiomegaly. No right and no definite left pleural  effusion. No pneumothorax. Left base scarring or subsegmental atelectasis. No lobar consolidation.  IMPRESSION: Cardiomegaly without congestive failure. Electronically Signed   By: Jeronimo Greaves M.D.   On: 02/10/2023 09:22   MR ANGIO HEAD WO CONTRAST Result Date: 02/10/2023 CLINICAL DATA:  Stroke/TIA, determine embolic source EXAM: MRA HEAD WITHOUT CONTRAST TECHNIQUE: Angiographic images of the Circle of Willis were acquired using MRA technique without intravenous contrast. COMPARISON:  None Available. FINDINGS: Anterior circulation: Bilateral intracranial ICAs, MCAs and ACAs are patent without proximal hemodynamically significant stenosis. Posterior circulation: Bilateral intradural vertebral arteries and basilar artery are patent without significant stenosis. Severe stenosis of the right P2 PCA. Left PCA is patent without significant stenosis IMPRESSION: Severe stenosis of the right P2 PCA. Electronically Signed   By: Feliberto Harts M.D.   On: 02/10/2023 00:03   [name] Result Date: 02/09/2023 CLINICAL DATA:  Neuro deficit, acute, stroke suspected. EXAM: MRI HEAD WITHOUT CONTRAST TECHNIQUE: Multiplanar, multiecho pulse sequences of the brain and surrounding structures were obtained without intravenous contrast. COMPARISON:  Head CT 02/09/2023 and MRI 11/09/2021 FINDINGS: Brain: There are small acute infarcts involving subcortical white matter in the posterior left frontal lobe and posterior left temporal lobe. A few small chronic cerebral and cerebellar microhemorrhages are unchanged from the prior MRI. Moderate-sized chronic right MCA and small chronic right cerebellar infarcts are again noted with ex vacuo dilatation of the right lateral ventricle. There is a background of mild chronic small vessel ischemia in the cerebral white matter which is similar to the prior MRI. There is moderate cerebral atrophy with prominent extra-axial CSF over the right greater than left cerebral convexities being unchanged from the prior MRI and attributed to atrophy and subarachnoid space enlargement. No mass,  midline shift, or definite extra-axial fluid collection is identified. Vascular: Major intracranial vascular flow voids are preserved. Skull and upper cervical spine: Unremarkable bone marrow signal. Sinuses/Orbits: Bilateral cataract extraction. Clear paranasal sinuses. Large chronic right mastoid effusion. Other: None. IMPRESSION: 1. Small acute left frontal and left temporal white matter infarcts. 2. Chronic ischemia including old right MCA and right cerebellar infarcts. Electronically Signed   By: Sebastian Ache M.D.   On: 02/09/2023 15:05   CT HEAD CODE STROKE WO CONTRAST Result Date: 02/09/2023 CLINICAL DATA:  Code stroke. Neuro deficit, acute, stroke suspected. EXAM: CT HEAD WITHOUT CONTRAST TECHNIQUE: Contiguous axial images were obtained from the base of the skull through the vertex without intravenous contrast. RADIATION DOSE REDUCTION: This exam was performed according to the departmental dose-optimization program which includes automated exposure control, adjustment of the mA and/or kV according to patient size and/or use of iterative reconstruction technique. COMPARISON:  Head CT 02/16/2022 and MRI 11/09/2021 FINDINGS: Brain: There is no evidence of an acute cortically based infarct, intracranial hemorrhage, mass, midline shift, or extra-axial fluid collection. A moderate-sized chronic right MCA infarct is unchanged with involvement of the operculum, insula, and basal ganglia and associated ex vacuo dilatation of the body and frontal horn of the right lateral ventricle. A new 7 mm discrete hypodense focus in the subcortical white matter of the posterior left frontal lobe is suggestive of an age indeterminate lacunar infarct. Hypodensities elsewhere in the cerebral white matter bilaterally are unchanged and nonspecific but compatible with mild chronic small vessel ischemic disease. A small chronic right cerebellar infarct is unchanged. There is moderate cerebral atrophy. Enlarged extra-axial CSF spaces  over the frontal convexities are unchanged and attributed to atrophy. Vascular: Calcified atherosclerosis at the skull base. No  hyperdense vessel. Skull: No acute fracture or suspicious osseous lesion. Sinuses/Orbits: Visualized paranasal sinuses are clear. Large chronic right mastoid effusion. Bilateral cataract extraction. Other: None. ASPECTS ([place] Stroke Program Early CT Score) - Ganglionic level infarction (caudate, lentiform nuclei, internal capsule, insula, M1-M3 cortex): 7 - Supraganglionic infarction (M4-M6 cortex): 3 Total score (0-10 with 10 being normal): 10 These results were communicated to Dr. Iver Nestle at 1:30 pm on 02/09/2023 by text page via the [facility] messaging system. IMPRESSION: 1. No evidence of acute cortically based infarct or intracranial hemorrhage. ASPECTS of 10. 2. Age indeterminate left frontal white matter lacunar infarct. 3. Chronic ischemia including an old right MCA infarct. Electronically Signed   By: Sebastian Ache M.D.   On: 02/09/2023 13:31   DG C-Arm 1-60 Min-No Report Result Date: 02/07/2023 Fluoroscopy was utilized by the requesting physician.  No radiographic interpretation.   CT RENAL STONE STUDY Result Date: 02/06/2023 CLINICAL DATA:  Abdominal/flank pain.  Concern for kidney stone. EXAM: CT ABDOMEN AND PELVIS WITHOUT CONTRAST TECHNIQUE: Multidetector CT imaging of the abdomen and pelvis was performed following the standard protocol without IV contrast. RADIATION DOSE REDUCTION: This exam was performed according to the departmental dose-optimization program which includes automated exposure control, adjustment of the mA and/or kV according to patient size and/or use of iterative reconstruction technique. COMPARISON:  CT abdomen pelvis dated 09/23/2021. FINDINGS: Evaluation of this exam is limited in the absence of intravenous contrast. Lower chest: Small bilateral pleural effusions with associated partial compressive atelectasis of the lung bases. Mild cardiomegaly.  Tricuspid mechanical bowel. Coronary vascular calcification. No intra-abdominal free air or free fluid. Hepatobiliary: The liver is unremarkable. No biliary dilatation. Cholecystectomy. No retained calcified stone noted in the central CBD. Pancreas: Unremarkable. No pancreatic ductal dilatation or surrounding inflammatory changes. Spleen: Normal in size without focal abnormality. Adrenals/Urinary Tract: The adrenal glands unremarkable. There is a 12 mm stone in the proximal right ureter close to the ureteropelvic junction with mild right hydronephrosis. Several additional nonobstructing right renal calculi measure up to 1 cm in the interpolar aspect of the right kidney. Multiple right renal cysts are poorly characterized on this noncontrast CT but were present on the prior CT. A 12 mm high attenuating exophytic cyst from the inferior pole of the right kidney likely represents a hemorrhagic or proteinaceous cyst. There has been interval development of high attenuating content within a exophytic 8.5 cm cyst arising from the inferior pole of the right kidney consistent with hemorrhage. The possibility of active lesional is not excluded but cannot be evaluated on this noncontrast CT. No significant perinephric fluid. There is no hydronephrosis or nephrolithiasis on the left. The left ureter is unremarkable. The urinary bladder is decompressed around a Foley catheter. Stomach/Bowel: There is sigmoid diverticulosis. There is no bowel obstruction or active inflammation. The appendix is normal. Vascular/Lymphatic: Mild aortoiliac atherosclerotic disease. The IVC is unremarkable. No portal gas. There is no adenopathy. Reproductive: Mildly enlarged prostate gland measuring 5 cm in transverse axial diameter. The seminal vesicles are symmetric. Other: Midline vertical anterior abdominal wall incisional scar. There is mild asymmetric enlargement of the left iliacus muscle with an ill-defined high attenuating area suspicious for  an intramuscular bleed. Slight asymmetric enlargement of the left psoas muscle also noted. Musculoskeletal: Small fat containing midline supraumbilical hernia. There is osteopenia with degenerative changes of the spine. No acute osseous pathology. Total left hip arthroplasty. IMPRESSION: 1. A 12 mm proximal right ureteral stone with mild right hydronephrosis. 2. Additional nonobstructing right renal calculi.  3. Hemorrhage within the dominant 8.5 cm cyst in the inferior pole of the right kidney. Active bleed or underlying malignancy is not excluded but cannot be evaluated on this noncontrast CT. Urology consult and follow-up recommended to exclude a bleeding mass. 4. Small intramuscular bleed within the left iliopsoas. Correlation with anticoagulation status recommended. 5. Sigmoid diverticulosis. No bowel obstruction. Normal appendix. 6. Small bilateral pleural effusions with associated partial compressive atelectasis of the lung bases. 7.  Aortic Atherosclerosis (ICD10-I70.0). Electronically Signed   By: Elgie Collard M.D.   On: 02/06/2023 10:54   DG CHEST PORT 1 VIEW Result Date: 02/05/2023 CLINICAL DATA:  PICC placement. EXAM: PORTABLE CHEST 1 VIEW COMPARISON:  Chest radiograph dated 02/04/2023. FINDINGS: Right-sided PICC with tip over central SVC. Improved aeration of the lungs compared to prior radiograph. No focal consolidation or pneumothorax. Trace bilateral pleural effusions suspected. Stable cardiac silhouette. Median sternotomy wires and mechanical cardiac valve. No acute osseous pathology. IMPRESSION: 1. Right-sided PICC with tip over central SVC. 2. Improved aeration of the lungs. Electronically Signed   By: Elgie Collard M.D.   On: 02/05/2023 15:12   [place] EKG SITE RITE Result Date: 02/05/2023 If Site Rite image not attached, placement could not be confirmed due to current cardiac rhythm.  DG CHEST PORT 1 VIEW Result Date: 02/04/2023 CLINICAL DATA:  Shortness of breath. EXAM: PORTABLE CHEST  1 VIEW COMPARISON:  Chest radiograph dated 02/03/2023. FINDINGS: There is cardiomegaly with vascular congestion. No focal consolidation, pleural effusion, pneumothorax. Median sternotomy wires and mechanical cardiac valve. No acute osseous pathology. IMPRESSION: Cardiomegaly with vascular congestion. Electronically Signed   By: Elgie Collard M.D.   On: 02/04/2023 14:25   DG Abd 1 View Result Date: 02/04/2023 CLINICAL DATA:  Nausea and vomiting. EXAM: ABDOMEN - 1 VIEW COMPARISON:  02/03/2023 FINDINGS: Nasogastric tube tip is in the left upper abdomen and likely in the gastric body region. Bowel gas in the abdomen and pelvis with a nonobstructive pattern. Surgical clips in the right abdomen. Left hip arthroplasty. Surgical clips in the right groin. Two large calcifications in the region of the right renal shadow both measuring approximately 1 cm. 1 of these calcifications could be in the renal pelvic region. IMPRESSION: 1. Right renal calculi, largest measuring up to 1.0 cm. One stone could be in the renal pelvic region. This could be better evaluated with CT imaging. 2. Nonobstructive bowel gas pattern. 3. Tip of the nasogastric tube is in the gastric body region. Electronically Signed   By: Richarda Overlie M.D.   On: 02/04/2023 10:04   ECHOCARDIOGRAM COMPLETE Result Date: 02/03/2023    ECHOCARDIOGRAM REPORT   Patient Name:   [name]. Date of Exam: 02/03/2023 Medical Rec #:  [id number]             Height:       72.0 in Accession #:    [id number]            Weight:       173.3 lb Date of Birth:  18-Nov-1935             BSA:          2.005 m Patient Age:    87 years              BP:           91/64 mmHg Patient Gender: M  HR:           76 bpm. Exam Location:  Inpatient Procedure: 2D Echo, Cardiac Doppler, Color Doppler and Intracardiac            Opacification Agent Indications:    TIA  History:        Patient has prior history of Echocardiogram examinations, most                 recent  03/19/2022. Previous Myocardial Infarction and CAD, COPD,                 Arrythmias:Atrial Fibrillation; Risk Factors:Hypertension.                 Aortic Valve: bioprosthetic valve is present in the aortic                 position.                 Mitral Valve: prosthetic annuloplasty ring valve is present in                 the mitral position.  Sonographer:    Webb Laws Referring Phys: [id number] Krisalyn Yankowski MANUEL ORTIZ IMPRESSIONS  1. S/P MV repair with mean gradient 8 mmHg and no MR; s/p AVR with mean gradient 14 mmHg and no AI; s/p TV repair with mild TR.  2. Left ventricular ejection fraction, by estimation, is 60 to 65%. The left ventricle has normal function. The left ventricle has no regional wall motion abnormalities. There is moderate left ventricular hypertrophy. Left ventricular diastolic parameters are indeterminate. There is the interventricular septum is flattened in systole and diastole, consistent with right ventricular pressure and volume overload.  3. Right ventricular systolic function is normal. The right ventricular size is mildly enlarged. There is severely elevated pulmonary artery systolic pressure.  4. Left atrial size was severely dilated.  5. Right atrial size was severely dilated.  6. The mitral valve has been repaired/replaced. No evidence of mitral valve regurgitation. No evidence of mitral stenosis. There is a prosthetic annuloplasty ring present in the mitral position.  7. The tricuspid valve is has been repaired/replaced. The tricuspid valve is status post repair with an annuloplasty ring.  8. The aortic valve has been repaired/replaced. Aortic valve regurgitation is not visualized. No aortic stenosis is present. There is a bioprosthetic valve present in the aortic position.  9. The inferior vena cava is dilated in size with <50% respiratory variability, suggesting right atrial pressure of 15 mmHg. FINDINGS  Left Ventricle: Left ventricular ejection fraction, by estimation, is 60  to 65%. The left ventricle has normal function. The left ventricle has no regional wall motion abnormalities. Definity contrast agent was given IV to delineate the left ventricular  endocardial borders. The left ventricular internal cavity size was normal in size. There is moderate left ventricular hypertrophy. The interventricular septum is flattened in systole and diastole, consistent with right ventricular pressure and volume overload. Left ventricular diastolic parameters are indeterminate. Right Ventricle: The right ventricular size is mildly enlarged. Right ventricular systolic function is normal. There is severely elevated pulmonary artery systolic pressure. The tricuspid regurgitant velocity is 3.39 m/s, and with an assumed right atrial  pressure of 15 mmHg, the estimated right ventricular systolic pressure is 61.0 mmHg. Left Atrium: Left atrial size was severely dilated. Right Atrium: Right atrial size was severely dilated. Pericardium: There is no evidence of pericardial effusion. Mitral Valve: The mitral valve has been repaired/replaced. No evidence of  mitral valve regurgitation. There is a prosthetic annuloplasty ring present in the mitral position. No evidence of mitral valve stenosis. MV peak gradient, 22.5 mmHg. The mean mitral valve gradient is 8.0 mmHg. Tricuspid Valve: The tricuspid valve is has been repaired/replaced. Tricuspid valve regurgitation is mild . No evidence of tricuspid stenosis. The tricuspid valve is status post repair with an annuloplasty ring. Aortic Valve: The aortic valve has been repaired/replaced. Aortic valve regurgitation is not visualized. No aortic stenosis is present. Aortic valve mean gradient measures 14.2 mmHg. Aortic valve peak gradient measures 17.8 mmHg. Aortic valve area, by VTI measures 1.60 cm. There is a bioprosthetic valve present in the aortic position. Pulmonic Valve: The pulmonic valve was normal in structure. Pulmonic valve regurgitation is mild. No  evidence of pulmonic stenosis. Aorta: The aortic root is normal in size and structure. Venous: The inferior vena cava is dilated in size with less than 50% respiratory variability, suggesting right atrial pressure of 15 mmHg. IAS/Shunts: No atrial level shunt detected by color flow Doppler. Additional Comments: S/P MV repair with mean gradient 8 mmHg and no MR; s/p AVR with mean gradient 14 mmHg and no AI; s/p TV repair with mild TR.  LEFT VENTRICLE PLAX 2D LVIDd:         4.50 cm      Diastology LVIDs:         3.50 cm      LV e' medial:    3.37 cm/s LV PW:         1.20 cm      LV E/e' medial:  59.6 LV IVS:        1.40 cm      LV e' lateral:   7.40 cm/s LVOT diam:     2.10 cm      LV E/e' lateral: 27.2 LV SV:         72 LV SV Index:   36 LVOT Area:     3.46 cm  LV Volumes (MOD) LV vol d, MOD A2C: 120.0 ml LV vol d, MOD A4C: 98.9 ml LV vol s, MOD A2C: 55.1 ml LV vol s, MOD A4C: 41.1 ml LV SV MOD A2C:     64.9 ml LV SV MOD A4C:     98.9 ml LV SV MOD BP:      60.2 ml RIGHT VENTRICLE             IVC RV Basal diam:  4.65 cm     IVC diam: 3.20 cm RV Mid diam:    4.20 cm RV S prime:     10.30 cm/s TAPSE (M-mode): 1.6 cm LEFT ATRIUM              Index        RIGHT ATRIUM           Index LA diam:        4.70 cm  2.34 cm/m   RA Area:     21.10 cm LA Vol (A2C):   141.0 ml 70.34 ml/m  RA Volume:   54.90 ml  27.39 ml/m LA Vol (A4C):   98.7 ml  49.24 ml/m LA Biplane Vol: 120.0 ml 59.86 ml/m  AORTIC VALVE                     PULMONIC VALVE AV Area (Vmax):    1.99 cm      PR End Diast Vel: 1.85 msec AV Area (Vmean):   1.63 cm AV  Area (VTI):     1.60 cm AV Vmax:           211.12 cm/s AV Vmean:          176.800 cm/s AV VTI:            0.452 m AV Peak Grad:      17.8 mmHg AV Mean Grad:      14.2 mmHg LVOT Vmax:         121.00 cm/s LVOT Vmean:        83.000 cm/s LVOT VTI:          0.209 m LVOT/AV VTI ratio: 0.46  AORTA Ao Root diam: 3.30 cm Ao Asc diam:  3.50 cm MITRAL VALVE                TRICUSPID VALVE MV Area (PHT): 1.86  cm     TR Peak grad:   46.0 mmHg MV Area VTI:   1.10 cm     TR Vmax:        339.00 cm/s MV Peak grad:  22.5 mmHg MV Mean grad:  8.0 mmHg     SHUNTS MV Vmax:       2.37 m/s     Systemic VTI:  0.21 m MV Vmean:      132.0 cm/s   Systemic Diam: 2.10 cm MV Decel Time: 408 msec MV E velocity: 201.00 cm/s MV A velocity: 55.60 cm/s MV E/A ratio:  3.62 Olga Millers MD Electronically signed by Olga Millers MD Signature Date/Time: 02/03/2023/3:57:16 PM    Final    DG Abd 1 View Result Date: 02/03/2023 CLINICAL DATA:  Nasogastric tube placement. EXAM: ABDOMEN - 1 VIEW COMPARISON:  Earlier today FINDINGS: The enteric tube has been advanced, the tip and side port are below the diaphragm in the stomach. No bowel dilatation in the upper abdomen. Right upper quadrant surgical clips. IMPRESSION: Enteric tube tip and side port below the diaphragm in the stomach. Electronically Signed   By: Narda Rutherford M.D.   On: 02/03/2023 15:50   DG Abd 1 View Result Date: 02/03/2023 CLINICAL DATA:  Nausea and vomiting EXAM: ABDOMEN - 1 VIEW; PORTABLE ABDOMEN - 1 VIEW COMPARISON:  None Available. FINDINGS: Sequential images of the upper abdomen demonstrate placement of an esophagogastric tube, tip below the diaphragm, side port above the gastroesophageal junction. Nonobstructive pattern of included bowel gas. No free air on supine radiographs. Cardiomegaly status post median sternotomy with valvular prosthesis and left atrial appendage clip. IMPRESSION: 1. Sequential images of the upper abdomen demonstrate placement of an esophagogastric tube, tip below the diaphragm, side port above the gastroesophageal junction. Recommend advancement. 2. Nonobstructive pattern of included bowel gas. Electronically Signed   By: Jearld Lesch M.D.   On: 02/03/2023 13:22   DG Abd Portable 1V Result Date: 02/03/2023 CLINICAL DATA:  Nausea and vomiting EXAM: ABDOMEN - 1 VIEW; PORTABLE ABDOMEN - 1 VIEW COMPARISON:  None Available. FINDINGS: Sequential  images of the upper abdomen demonstrate placement of an esophagogastric tube, tip below the diaphragm, side port above the gastroesophageal junction. Nonobstructive pattern of included bowel gas. No free air on supine radiographs. Cardiomegaly status post median sternotomy with valvular prosthesis and left atrial appendage clip. IMPRESSION: 1. Sequential images of the upper abdomen demonstrate placement of an esophagogastric tube, tip below the diaphragm, side port above the gastroesophageal junction. Recommend advancement. 2. Nonobstructive pattern of included bowel gas. Electronically Signed   By: Jearld Lesch M.D.   On: 02/03/2023  13:22   DG CHEST PORT 1 VIEW Result Date: 02/03/2023 CLINICAL DATA:  Hypoxia, status post aortic valve replacement EXAM: PORTABLE CHEST 1 VIEW COMPARISON:  02/02/2023 FINDINGS: Cardiomegaly status post median sternotomy with aortic valve prosthesis and left atrial appendage clip. Similar retrocardiac atelectasis. Mild diffuse interstitial opacity. No acute osseous findings. IMPRESSION: 1. Cardiomegaly status post median sternotomy with aortic valve prosthesis and left atrial appendage clip. 2. Similar retrocardiac atelectasis. Mild diffuse interstitial opacity, consistent with edema. Electronically Signed   By: Jearld Lesch M.D.   On: 02/03/2023 12:22   DG Chest Portable 1 View Result Date: 02/02/2023 CLINICAL DATA:  Shortness of breath. EXAM: PORTABLE CHEST 1 VIEW COMPARISON:  02/16/2022 FINDINGS: The cardio pericardial silhouette is enlarged. Diffuse interstitial opacity suggests edema. No focal lung consolidation. No substantial pleural effusion. Bones are diffusely demineralized. Telemetry leads overlie the chest. IMPRESSION: Enlargement of the cardiopericardial silhouette with diffuse interstitial opacity suggesting edema. Electronically Signed   By: Kennith Center M.D.   On: 02/02/2023 08:21     Time coordinating discharge: Over 30 minutes    Lewie Chamber,  MD  [facility] 02/20/2023, 1:19 PM

## 2023-02-20 NOTE — Plan of Care (Signed)
  Problem: Education: Goal: Ability to demonstrate management of disease process will improve Outcome: Progressing Goal: Ability to verbalize understanding of medication therapies will improve Outcome: Progressing   Problem: Activity: Goal: Capacity to carry out activities will improve Outcome: Progressing   Problem: Cardiac: Goal: Ability to achieve and maintain adequate cardiopulmonary perfusion will improve Outcome: Progressing   Problem: Education: Goal: Knowledge of General Education information will improve Description: Including pain rating scale, medication(s)/side effects and non-pharmacologic comfort measures Outcome: Progressing   Problem: Health Behavior/Discharge Planning: Goal: Ability to manage health-related needs will improve Outcome: Progressing   Problem: Clinical Measurements: Goal: Ability to maintain clinical measurements within normal limits will improve Outcome: Progressing Goal: Will remain free from infection Outcome: Progressing Goal: Diagnostic test results will improve Outcome: Progressing Goal: Respiratory complications will improve Outcome: Progressing Goal: Cardiovascular complication will be avoided Outcome: Progressing   Problem: Activity: Goal: Risk for activity intolerance will decrease Outcome: Progressing   Problem: Nutrition: Goal: Adequate nutrition will be maintained Outcome: Progressing   Problem: Coping: Goal: Level of anxiety will decrease Outcome: Progressing   Problem: Elimination: Goal: Will not experience complications related to bowel motility Outcome: Progressing Goal: Will not experience complications related to urinary retention Outcome: Progressing   Problem: Pain Managment: Goal: General experience of comfort will improve and/or be controlled Outcome: Progressing   Problem: Safety: Goal: Ability to remain free from injury will improve Outcome: Progressing   Problem: Skin Integrity: Goal: Risk for  impaired skin integrity will decrease Outcome: Progressing   Problem: Education: Goal: Knowledge of disease or condition will improve Outcome: Progressing Goal: Knowledge of secondary prevention will improve (MUST DOCUMENT ALL) Outcome: Progressing Goal: Knowledge of patient specific risk factors will improve (DELETE if not current risk factor) Outcome: Progressing   Problem: Ischemic Stroke/TIA Tissue Perfusion: Goal: Complications of ischemic stroke/TIA will be minimized Outcome: Progressing   Problem: Coping: Goal: Will verbalize positive feelings about self Outcome: Progressing Goal: Will identify appropriate support needs Outcome: Progressing   Problem: Health Behavior/Discharge Planning: Goal: Ability to manage health-related needs will improve Outcome: Progressing Goal: Goals will be collaboratively established with patient/family Outcome: Progressing   Problem: Self-Care: Goal: Ability to participate in self-care as condition permits will improve Outcome: Progressing Goal: Verbalization of feelings and concerns over difficulty with self-care will improve Outcome: Progressing Goal: Ability to communicate needs accurately will improve Outcome: Progressing   Problem: Nutrition: Goal: Risk of aspiration will decrease Outcome: Progressing Goal: Dietary intake will improve Outcome: Progressing

## 2023-02-20 NOTE — TOC Transition Note (Signed)
Transition of Care [facility]) - Discharge Note   Patient Details  Name: [name]. MRN: [id number] Date of Birth: 12/25/1935  Transition of [facility]) CM/SW Contact:  Larrie Kass, LCSW Phone Number: 02/20/2023, 12:51 PM   Clinical Narrative:    CSW received message, pt is requesting a Wheelchair. CSW spoke with pt, who reports recently receiving a walker from his insurance company. CSW explained  pt will have to private pay for the wheelchair. Pt reported his family will pick him up at time of d/c. No further TOC needs TOC sign off.    Final next level of care: Home w Home Health Services Barriers to Discharge: Continued Medical Work up   Patient Goals and CMS Choice Patient states their goals for this hospitalization and ongoing recovery are:: retrun home CMS Medicare.gov Compare Post Acute Care list provided to:: Patient Choice offered to / list presented to : Patient      Discharge Placement                       Discharge Plan and Services Additional resources added to the After Visit Summary for   In-house Referral: Clinical Social Work                                   Social Drivers of Health (SDOH) Interventions SDOH Screenings   Food Insecurity: No Food Insecurity (02/03/2023)  Housing: Low Risk  (02/03/2023)  Transportation Needs: No Transportation Needs (02/03/2023)  Utilities: Not At Risk (02/03/2023)  Alcohol Screen: Low Risk  (06/02/2021)  Depression (PHQ2-9): Low Risk  (06/16/2021)  Financial Resource Strain: Low Risk  (06/16/2021)  Social Connections: Unknown (02/03/2023)  Stress: No Stress Concern Present (06/16/2021)  Tobacco Use: Low Risk  (02/07/2023)     Readmission Risk Interventions    02/11/2023    4:00 PM 11/10/2021    3:54 PM  Readmission Risk Prevention Plan  Transportation Screening Complete Complete  PCP or Specialist Appt within 3-5 Days Not Complete Complete  Not Complete comments Patient will discharge to SNF.    HRI or Home Care Consult Complete Complete  Social Work Consult for Recovery Care Planning/Counseling Complete Complete  Palliative Care Screening Not Applicable Not Applicable  Medication Review Oceanographer) Complete Complete

## 2023-02-20 NOTE — Hospital Course (Signed)
[name]. is a 88 y.o. male who has an extensive PMH as below, significant for CAD, CVA, aortic valve replacement 2012, hypertension, pulmonary hypertension, atrial fibrillation and small bowel obstruction. He presented to [facility] ED 1/25 with dyspnea that woke him from his sleep the night prior. He had also complained of LE edema over the preceding few weeks. On EMS arrival, he had sats of 85% on room air. He was placed on [place] O2 and given Solumedrol and DuoNebs in route to ED.   In ED, CXR was suggestive of mild edema. He was admitted by [facility] and started on diuresis. He was seen by cardiology in consultation who agreed with diuresis for fluid overload and obtaining an echo. Echo was obtained and demonstrated EF 60-65%, mod LVH, RV pressure overload with RVSP 61, LA and RA severely dilated.   On evening of 1/26, he developed nausea and vomiting with probable aspiration event. There was concern for SBO and was seen by general surgery who recommended continuing NPO and continuing NGT to suction.   On 1/27, diuresis was held due to bump in renal function after IV lasix. He was only net negative since admit. He did not appear to have excess volume per cardiology notes and appeared improved from CHF standpoint. Dyspnea and hypoxia were felt to more likely be related to aspiration PNA.  General surgery had also seen and he had benign exam and was passing flatus. He unfortunately pulled out his NGT that day.   After pulling out NGT 1/27, he had episode of tachypnea and increased WOB along with spike in temp to 102.86F. Due to increased WOB, PCCM asked to see in consultation. He had been on 4L [place] prior to this episode.   Blood cultures from admission returned positive for Enterobacter cloacae. He was on Cefepime escalated to Meropenem.  Family report concerns of pt aspirating for over a week prior to admit. Further GOC clarified with pt's son, [name], changed to DNR/ DNI.    CT scan on 1/29 showed  mild right hydro with 12 mm right proximal ureteral stone.  Urology was c/s and he's now s/p cystoscopy with R ureteral stent placement.   CT also showed hemorrhage within the dominant 8.5 cm cyst in the inferior pole of the right kidney as well as a small intramuscular bleed within the left iliopsoas.    He was found to have new facial droop and L sided weakness and altered speech on 2/1.  MRI showed acute L frontal and L temporal white matter infarcts.  He was seen by neurology.    Anticoagulation was held.  A repeat head CT on 2/5 showed no evidence of hemorrhagic conversion.  Heparin was restarted on 2/5 PM.    He's completed  antibiotics for his enterococcus bacteremia.  He appears stable with reinitiation of his warfarin, his INR is at goal.  Hgb remained stable at discharge also.    Assessment & Plan:   Principal Problem:   Acute respiratory failure with hypoxia (HCC) Active Problems:   Essential hypertension   Permanent atrial fibrillation (HCC)   CAD (coronary artery disease)   s/p mitral valve repair   Anemia due to chronic blood loss   Benign prostatic hyperplasia with lower urinary tract symptoms   Thrombocytopenia (HCC)   Gout   Hypokalemia   Left hemiparesis (HCC)   S/P AVR (aortic valve replacement)   History of ischemic right MCA stroke   Hyperglycemia   S/P tricuspid  valve repair   S/P Maze operation for atrial fibrillation   Prediabetes   Protein-calorie malnutrition, moderate (HCC)   HLD (hyperlipidemia)   Other secondary pulmonary hypertension (HCC)   Elevated troponin   Volume overload   Aspiration pneumonia (HCC)   DNR (do not resuscitate)   Ureteral calculus, right   Nephrolithiasis   Sepsis due to Enterobacter [facility])   Renal cyst    Acute ischemic stroke: Thought to be related to FFP/platelet infusion MRI brain on 2/1 showed small acute left frontal and left temporal white matter infarct. MR angio showed severe stenosis of right P2 PCA Neurology  follow up appreciated -> 02/10/2023 note Repeat head CT without evidence of hemorrhagic conversion  Seen by SLP- dysphagia 3 diet. He's now on warfarin    Acute hypoxic respiratory failure-multifactorial in the setting of aspiration pneumonia, fluid overload/CHF exacerbation: 02/09/2023: Off of oxygen. Continue aspiration precautions.   Continue pulmonary hygiene. Dysphagia diet.   Enterobacter Colace bacteremia in the setting of UTI  right ureteral stone with mild hydronephrosis: Septic shock-resolved 1/30 was not a candidate for percutaneous  nephrostomy tube given elevated INR and thrombocytopenia.  S/p cystoscopy with right ureteral stent placement 1/30 Off of pressors now.  Fever improved. WBC improved. Cefepime changed to Meropenem due to encephalopathy. Continue antibiotics for total 14 days (antibiotics now complete). He has PICC line placed 02/05/23; removed prior to discharge  1 of 2 cultures from 1/29 with staph epidermidis, suspect contaminant   Indwelling Foley Catheter He's on flomax and finasteride - continue foley at discharge with outpt followup with urology    Left iliopsoas intramuscular bleed Right renal cyst with hemorrhage  LUE Ecchymoses Discussed renal cyst hemorrhage with urology, ok with resuming anticoagulation   Ileus  NGT removed 1/27, passing flatus Rectal tube is no longer in place   Hypernatremia: improved   AKI: Baseline creatinine 0.9.  Worsened after IV diuresis and shock.  Mild right hydronephrosis with right renal calculi.  Status post stent placement on 1/30 by urology.  Renal function improved.   Afib - on coumadin Valvular disease s/p bioprosthetic AVR 2012, w/ MV and TV repair  CAD, non-obstructive  HFpEF: Remain euvolemic.  Continue to hold on beta-blocker and Coumadin.  Cardiology signed off on 02/06/2023.   Anemia of chronic disease Acute on chronic Thrombocytopenia Coagulapathy on coumadin  S/p 2 units platelets, 1 unit FFP, 4  units pRBC's  Transfuse additional unit pRBC today With repeat head CT without evidence of hemorrhagic conversion Cardiology notably has recommended heparin bridge for INR <2 Per oncology, anemia thought due to sepsis, bleeding, renal failure, and phlebotomy - Hb expected to improve over next several weeks Dr. Truett Perna -> planning for outpatient follow up (may have early MDS vs anemia related to chronic blood loss), he notes it'll take weeks for Hb to recover   Moderate protein calorie malnutrition:  Dietitian on board Encourage oral diet, supplements.   Hyperglycemia A1c 6.2 SSI

## 2023-02-20 NOTE — Plan of Care (Signed)
Stopped in to review plan with [name]. He looks well today and is up in chair doing word puzzles. I have turned in his information in to our schedulers who should be contacting him directly. He confirms that he will call [facility] to confirm schedule for definitive stone mgmt if that is in his AVS. He is eager to discharge home, and is clear from a Urologic perspective.  Gerald Kirschner, NP [facility] Pager: [phone]   [facility] [address] floor [place], [place] [address] [phone]

## 2023-02-20 NOTE — Progress Notes (Signed)
Occupational Therapy Treatment Patient Details Name: [name]. MRN: [id number] DOB: 06/22/1935 [name] Date: 02/20/2023   History of present illness Patient is a 88 year old male who presented on 1/25 with dyspnea and hypoxia. Patient was admited with concerns of SBO. 1/27 patient pulled out NG tube with increased work of breath and fever. 1/29 patient on HFNC with CT revealing mild right hydronephrosis with 12 mm right proximal ureteral stone.1/31 patient was taken off vasopressors and received PRBCs. 2/1 patient found to have left side weakness with code stroke called. MRI revealed L MCA. PMH: R TKR, L THR, L TKA,h/o stroke, peripheral neuropathy, gout, HTN.   OT comments  Patient seen for skilled OT session this am. Pt alert and cooperative as well as open to all treatment presented. No pain reported, only fatigue. Pt still with Foley catheter but now progressed from NPO to self feeding with set up. Pt able to move from and to bed level with CGA, sit EOB with CGA for simple ADL's, set up for grooming and UB self care and using figure 4 technique with mod-min A for LB self care. Pt amb with RW ~ 75 ft with min A and cues for RW safety and hand placement. Pt with decreased overall strength, activity tolerance and balance limiting functional safety and independence requiring ongoing OT services. Pt's ultimate goal is to return home with family and caregivers but continues to require significant need for skilled therapy services.  Patient will benefit from continued inpatient follow up therapy, <3 hours/day.       If plan is discharge home, recommend the following:  A lot of help with walking and/or transfers;A lot of help with bathing/dressing/bathroom;Assistance with cooking/housework;Direct supervision/assist for medications management;Help with stairs or ramp for entrance;Supervision due to cognitive status   Equipment Recommendations  None recommended by OT          Precautions /  Restrictions Precautions Precautions: Fall Restrictions Weight Bearing Restrictions Per Provider Order: No       Mobility Bed Mobility Overal bed mobility: Needs Assistance Bed Mobility: Sit to Supine Rolling: Contact guard assist Sidelying to sit: Contact guard assist Supine to sit: HOB elevated, Contact guard, Used rails Sit to supine: Supervision   General bed mobility comments: Supervision for lines, safety. Foley bag management    Transfers Overall transfer level: Needs assistance Equipment used: Rolling walker (2 wheels) Transfers: Sit to/from Stand Sit to Stand: Min assist, From elevated surface, Contact guard assist Stand pivot transfers: Min assist         General transfer comment: cues for RW safety and hand placement     Balance Overall balance assessment: Needs assistance Sitting-balance support: No upper extremity supported, Feet supported Sitting balance-Leahy Scale: Fair     Standing balance support: Bilateral upper extremity supported, During functional activity, Reliant on assistive device for balance Standing balance-Leahy Scale: Fair                             ADL either performed or assessed with clinical judgement   ADL Overall ADL's : Needs assistance/impaired Eating/Feeding: Set up   Grooming: Contact guard assist Grooming Details (indicate cue type and reason): EOB level Upper Body Bathing: Contact guard assist;Sitting   Lower Body Bathing: Minimal assistance;Sit to/from stand Lower Body Bathing Details (indicate cue type and reason): EOB sitting and standing with RW Upper Body Dressing : Minimal assistance;Sitting   Lower Body Dressing: Sit to/from  stand;Moderate assistance Lower Body Dressing Details (indicate cue type and reason): difficulty bringing L LE for figure 4 technique but able to with close S on R Toilet Transfer: Rolling walker (2 wheels);Comfort height toilet Toilet Transfer Details (indicate cue type and  reason): currently wih Foley catheter for urine mngt Toileting- Clothing Manipulation and Hygiene: Minimal assistance   Tub/ Shower Transfer: Minimal assistance;Rolling walker (2 wheels)   Functional mobility during ADLs: Minimal assistance;Rolling walker (2 wheels);Cueing for safety General ADL Comments: Pt currently with Foley catheter for urine management, close S for bed mobility and CGA for EOB sitting and sit to stand to RW, needs assistance to manage Foley. Figure 4 performed for B LE's for LB dressing and bathing with increased difficulty L>R with mod-min A. Amb with RW up to 75 ft in room and hallway with min A with cues for RW management and hand placement.     Vision Baseline Vision/History: 0 No visual deficits Ability to See in Adequate Light: 0 Adequate Additional Comments: able to read white board on wall from bed level and read signage L and R while amb in hallway without difficulty this session         Communication Communication Factors Affecting Communication: Hearing impaired   Cognition Arousal: Alert Behavior During Therapy: WFL for tasks assessed/performed Cognition: No apparent impairments           Following commands: Intact                 General Comments overall balance improving but continues to rely heavily on RW for standing balance and amb safety    Pertinent Vitals/ Pain       Pain Assessment Pain Assessment: No/denies pain   Frequency  Min 1X/week        Progress Toward Goals  OT Goals(current goals can now be found in the care plan section)  Progress towards OT goals: Progressing toward goals  Acute Rehab OT Goals Patient Stated Goal: pt reports he would like to be able to go home with his wife and therapy there OT Goal Formulation: With patient Time For Goal Achievement: 03/06/23 Potential to Achieve Goals: Good  Plan      Co-evaluation          OT goals addressed during session: ADL's and self-care      AM-PAC  OT "6 Clicks" Daily Activity     Outcome Measure   Help from another person eating meals?: A Little Help from another person taking care of personal grooming?: A Little Help from another person toileting, which includes using toliet, bedpan, or urinal?: A Lot   Help from another person to put on and taking off regular upper body clothing?: A Lot Help from another person to put on and taking off regular lower body clothing?: A Lot 6 Click Score: 12    End of Session Equipment Utilized During Treatment: Gait belt;Rolling walker (2 wheels)  OT Visit Diagnosis: Unsteadiness on feet (R26.81);Other abnormalities of gait and mobility (R26.89);Muscle weakness (generalized) (M62.81)   Activity Tolerance Patient limited by fatigue   Patient Left in bed;with call bell/phone within reach;with bed alarm set   Nurse Communication Mobility status        Time: 4540-9811 OT Time Calculation (min): 14 min  Charges: OT General Charges $OT Visit: 1 Visit OT Treatments $Self Care/Home Management : 8-22 mins  Higinio Grow OT/L Acute Rehabilitation Department  [phone]   Vicenta Dunning 02/20/2023, 11:19 AM

## 2023-02-20 NOTE — Progress Notes (Signed)
PHARMACY - ANTICOAGULATION CONSULT NOTE  Pharmacy Consult: Heparin + warfarin >> warfarin monotherapy Indication: bioprosthetic aortic valve, mitral valve repair, and Afib. -Acute ischemic stroke  Allergies  Allergen Reactions   Sulfa Antibiotics Other (See Comments)    Granulocytosis   Sulfamethoxazole-Trimethoprim     Other Reaction(s): Unknown   Zestril [Lisinopril] Cough    Patient Measurements: Height: 6' (182.9 cm) Weight: 75.8 kg (167 lb 1.7 oz) IBW/kg (Calculated) : 77.6 Heparin Dosing Weight: 77.9 kg  Vital Signs: Temp: 98.3 F (36.8 C) (02/12 0316) Temp Source: Oral (02/12 0316) BP: 115/46 (02/12 0316) Pulse Rate: 42 (02/12 0316)  Labs: Recent Labs    02/17/23 1353 02/18/23 0353 02/18/23 0353 02/19/23 0335 02/20/23 0410  HGB  --  7.8*   < > 7.7* 7.6*  HCT  --  24.6*  --  24.8* 25.1*  PLT  --  197  --  223 213  LABPROT  --  19.7*  --  22.6* 22.2*  INR  --  1.6*  --  2.0* 1.9*  HEPARINUNFRC 0.35 0.40  --  0.46  --   CREATININE  --  0.75  --  0.71 0.82   < > = values in this interval not displayed.    Estimated Creatinine Clearance: 70.6 mL/min (by C-G formula based on SCr of 0.82 mg/dL).   Assessment:  AC/Heme: On warfarin 5mg  daily except 7.5 mg on Mon PTA for bioprosthetic aortic valve, mitral valve repair, and Afib. -Acute ischemic stroke, MRI 2/1 w/ small left frontal and temporal white matter infarct  -Left iliopsoas IM bleed, renal cyst w/ hemorrhage, LUE hematoma - Cardiology recommended heparin bridge with warfarin while INR <2 (see 1/29 cardiology note) -Heparin resumed 2/5 - 2/7: Hgb 6.9, transfused. Resume warfarin - 2/11: Off heparin drip. Hgb/HCT stable at 7.6, no overt bleeding noted in chart. Plts stable.  2/12: No overt bleeding noted from RN. Occasional blood noted in foley bag but has been similar over past few days. Hgb/Hct stable.   Goal of Therapy:  INR goal 2.0-3.0 Monitor platelets by anticoagulation protocol: Yes   Today,  2/12 Plan:  - Coumadin 7.5 mg po x 1 today - Daily CBC and INR

## 2023-02-20 NOTE — Plan of Care (Signed)
Plan of care is reviewed.  Problem: Education: Goal: Ability to demonstrate management of disease process will improve Outcome: Progressing Goal: Ability to verbalize understanding of medication therapies will improve Outcome: Progressing   Problem: Activity: Goal: Capacity to carry out activities will improve Outcome: Progressing   Problem: Cardiac: Goal: Ability to achieve and maintain adequate cardiopulmonary perfusion will improve Outcome: Progressing   Problem: Health Behavior/Discharge Planning: Goal: Ability to manage health-related needs will improve Outcome: Progressing   Problem: Clinical Measurements: Goal: Ability to maintain clinical measurements within normal limits will improve Outcome: Progressing Goal: Will remain free from infection Outcome: Progressing Goal: Diagnostic test results will improve Outcome: Progressing Goal: Respiratory complications will improve Outcome: Progressing Goal: Cardiovascular complication will be avoided Outcome: Progressing   Problem: Nutrition: Goal: Adequate nutrition will be maintained Outcome: Progressing   Problem: Elimination: Goal: Will not experience complications related to bowel motility Outcome: Progressing Goal: Will not experience complications related to urinary retention Outcome: Progressing   Problem: Skin Integrity: Goal: Risk for impaired skin integrity will decrease Outcome: Progressing   Problem: Safety: Goal: Ability to remain free from injury will improve Outcome: Progressing  Filiberto Pinks, RN

## 2023-02-20 NOTE — Discharge Summary (Signed)
Physician Discharge Summary   Gerald Lou. ZOX:[id number] DOB: 1935/06/15 DOA: 02/02/2023  PCP: Jarrett Soho, PA-C  Admit date: 02/02/2023 Discharge date: 02/22/2023  Admitted From: Home Disposition:  [place] Discharging physician: Lewie Chamber, MD Barriers to discharge: changed mind on [facility] then back to SNF  Recommendations at discharge: Follow up with urology    Discharge Condition: stable CODE STATUS: Full  Diet recommendation:  Diet Orders (From admission, onward)     Start     Ordered   02/15/23 1638  DIET DYS 3 Fluid consistency: Thin  Diet effective now       Question:  Fluid consistency:  Answer:  Thin   02/15/23 1638            Hospital Course: Gerald Peretti. is a 88 y.o. male who has an extensive PMH as below, significant for CAD, CVA, aortic valve replacement 2012, hypertension, pulmonary hypertension, atrial fibrillation and small bowel obstruction. He presented to [facility] ED 1/25 with dyspnea that woke him from his sleep the night prior. He had also complained of LE edema over the preceding few weeks. On EMS arrival, he had sats of 85% on room air. He was placed on [place] O2 and given Solumedrol and DuoNebs in route to ED.   In ED, CXR was suggestive of mild edema. He was admitted by [facility] and started on diuresis. He was seen by cardiology in consultation who agreed with diuresis for fluid overload and obtaining an echo. Echo was obtained and demonstrated EF 60-65%, mod LVH, RV pressure overload with RVSP 61, LA and RA severely dilated.   On evening of 1/26, he developed nausea and vomiting with probable aspiration event. There was concern for SBO and was seen by general surgery who recommended continuing NPO and continuing NGT to suction.   On 1/27, diuresis was held due to bump in renal function after IV lasix. He was only net negative since admit. He did not appear to have excess volume per cardiology notes and appeared improved from CHF  standpoint. Dyspnea and hypoxia were felt to more likely be related to aspiration PNA.  General surgery had also seen and he had benign exam and was passing flatus. He unfortunately pulled out his NGT that day.   After pulling out NGT 1/27, he had episode of tachypnea and increased WOB along with spike in temp to 102.65F. Due to increased WOB, PCCM asked to see in consultation. He had been on 4L [place] prior to this episode.   Blood cultures from admission returned positive for Enterobacter cloacae. He was on Cefepime escalated to Meropenem.  Family report concerns of pt aspirating for over a week prior to admit. Further GOC clarified with pt's son, [name], changed to DNR/ DNI.    CT scan on 1/29 showed mild right hydro with 12 mm right proximal ureteral stone.  Urology was c/s and he's now s/p cystoscopy with R ureteral stent placement.   CT also showed hemorrhage within the dominant 8.5 cm cyst in the inferior pole of the right kidney as well as a small intramuscular bleed within the left iliopsoas.    He was found to have new facial droop and L sided weakness and altered speech on 2/1.  MRI showed acute L frontal and L temporal white matter infarcts.  He was seen by neurology.    Anticoagulation was held.  A repeat head CT on 2/5 showed no evidence of hemorrhagic conversion.  Heparin was restarted on  2/5 PM.    He's completed  antibiotics for his enterococcus bacteremia.  He appears stable with reinitiation of his warfarin, his INR is at goal.  Hgb remained stable at discharge also.    Assessment & Plan:   Principal Problem:   Acute respiratory failure with hypoxia (HCC) Active Problems:   Essential hypertension   Permanent atrial fibrillation (HCC)   CAD (coronary artery disease)   s/p mitral valve repair   Anemia due to chronic blood loss   Benign prostatic hyperplasia with lower urinary tract symptoms   Thrombocytopenia (HCC)   Gout   Hypokalemia   Left hemiparesis (HCC)   S/P AVR  (aortic valve replacement)   History of ischemic right MCA stroke   Hyperglycemia   S/P tricuspid valve repair   S/P Maze operation for atrial fibrillation   Prediabetes   Protein-calorie malnutrition, moderate (HCC)   HLD (hyperlipidemia)   Other secondary pulmonary hypertension (HCC)   Elevated troponin   Volume overload   Aspiration pneumonia (HCC)   DNR (do not resuscitate)   Ureteral calculus, right   Nephrolithiasis   Sepsis due to Enterobacter [facility])   Renal cyst    Acute ischemic stroke: Thought to be related to FFP/platelet infusion MRI brain on 2/1 showed small acute left frontal and left temporal white matter infarct. MR angio showed severe stenosis of right P2 PCA Neurology follow up appreciated -> 02/10/2023 note Repeat head CT without evidence of hemorrhagic conversion  Seen by SLP- dysphagia 3 diet. He's now on warfarin    Acute hypoxic respiratory failure-multifactorial in the setting of aspiration pneumonia, fluid overload/CHF exacerbation: 02/09/2023: Off of oxygen. Continue aspiration precautions.   Continue pulmonary hygiene. Dysphagia diet.   Enterobacter Colace bacteremia in the setting of UTI  right ureteral stone with mild hydronephrosis: Septic shock-resolved 1/30 was not a candidate for percutaneous  nephrostomy tube given elevated INR and thrombocytopenia.  S/p cystoscopy with right ureteral stent placement 1/30 Off of pressors now.  Fever improved. WBC improved. Cefepime changed to Meropenem due to encephalopathy. Continue antibiotics for total 14 days (antibiotics now complete). He has PICC line placed 02/05/23; removed prior to discharge  1 of 2 cultures from 1/29 with staph epidermidis, suspect contaminant   Indwelling Foley Catheter He's on flomax and finasteride - continue foley at discharge with outpt followup with urology    Left iliopsoas intramuscular bleed Right renal cyst with hemorrhage  LUE Ecchymoses Discussed renal cyst hemorrhage  with urology, ok with resuming anticoagulation   Ileus  NGT removed 1/27, passing flatus Rectal tube is no longer in place   Hypernatremia: improved   AKI: Baseline creatinine 0.9.  Worsened after IV diuresis and shock.  Mild right hydronephrosis with right renal calculi.  Status post stent placement on 1/30 by urology.  Renal function improved.   Afib - on coumadin Valvular disease s/p bioprosthetic AVR 2012, w/ MV and TV repair  CAD, non-obstructive  HFpEF: Remain euvolemic.  Continue to hold on beta-blocker and Coumadin.  Cardiology signed off on 02/06/2023.   Anemia of chronic disease Acute on chronic Thrombocytopenia Coagulapathy on coumadin  S/p 2 units platelets, 1 unit FFP, 4 units pRBC's  Transfuse additional unit pRBC today With repeat head CT without evidence of hemorrhagic conversion Cardiology notably has recommended heparin bridge for INR <2 Per oncology, anemia thought due to sepsis, bleeding, renal failure, and phlebotomy - Hb expected to improve over next several weeks Dr. Truett Perna -> planning for outpatient follow up (may have  early MDS vs anemia related to chronic blood loss), he notes it'll take weeks for Hb to recover   Moderate protein calorie malnutrition:  Dietitian on board Encourage oral diet, supplements.   Hyperglycemia A1c 6.2 SSI   Principal Diagnosis: Acute respiratory failure with hypoxia [facility])  Discharge Diagnoses: Active Hospital Problems   Diagnosis Date Noted   Acute respiratory failure with hypoxia (HCC) 02/02/2023   Essential hypertension     Priority: 4.   CAD (coronary artery disease) 12/19/2020    Priority: 5.   Permanent atrial fibrillation (HCC)     Priority: 5.   s/p mitral valve repair 01/30/2018    Priority: 7.   Anemia due to chronic blood loss 09/05/2018    Priority: 8.   Benign prostatic hyperplasia with lower urinary tract symptoms     Priority: 9.   Thrombocytopenia (HCC) 05/26/2020    Priority: 10.   Ureteral  calculus, right 02/06/2023   Nephrolithiasis 02/06/2023   Sepsis due to Enterobacter (HCC) 02/06/2023   Renal cyst 02/06/2023   Aspiration pneumonia (HCC) 02/04/2023   DNR (do not resuscitate) 02/04/2023   Elevated troponin 02/02/2023   Volume overload 02/02/2023   Other secondary pulmonary hypertension (HCC) 03/20/2022   HLD (hyperlipidemia) 09/23/2021   Protein-calorie malnutrition, moderate (HCC) 08/02/2020   Prediabetes 05/26/2020   S/P tricuspid valve repair 01/30/2018   S/P Maze operation for atrial fibrillation 01/30/2018   Hyperglycemia    History of ischemic right MCA stroke 10/29/2016   S/P AVR (aortic valve replacement)    Hypokalemia    Left hemiparesis (HCC)    Gout     Resolved Hospital Problems  No resolved problems to display.     Discharge Instructions     Increase activity slowly   Complete by: As directed    Increase activity slowly   Complete by: As directed    No wound care   Complete by: As directed    No wound care   Complete by: As directed       Allergies as of 02/22/2023       Reactions   Sulfa Antibiotics Other (See Comments)   Granulocytosis   Sulfamethoxazole-trimethoprim    Other Reaction(s): Unknown   Zestril [lisinopril] Cough        Medication List     TAKE these medications    allopurinol 300 MG tablet Commonly known as: ZYLOPRIM Take 150 mg by mouth every morning.   amLODipine 10 MG tablet Commonly known as: NORVASC TAKE 1 TABLET BY MOUTH DAILY   atenolol 25 MG tablet Commonly known as: TENORMIN TAKE ONE-HALF TABLET BY MOUTH  DAILY   Centrum Silver Adult 50+ Tabs Take 1 tablet by mouth daily.   cholecalciferol 25 MCG (1000 UNIT) tablet Commonly known as: VITAMIN D3 Take 1,000 Units by mouth daily.   Cinnamon 500 MG capsule Take 1,500 mg by mouth every morning.   ferrous sulfate 325 (65 FE) MG EC tablet Take 325 mg by mouth 2 (two) times daily.   finasteride 5 MG tablet Commonly known as: PROSCAR Take 1  tablet (5 mg total) by mouth daily.   rosuvastatin 10 MG tablet Commonly known as: CRESTOR TAKE 1 TABLET BY MOUTH DAILY   silodosin 8 MG Caps capsule Commonly known as: RAPAFLO Take 8 mg by mouth daily.   traZODone 50 MG tablet Commonly known as: DESYREL Take 25 mg by mouth at bedtime as needed for sleep.   warfarin 5 MG tablet Commonly known as: COUMADIN Take  as directed. If you are unsure how to take this medication, talk to your nurse or doctor. Original instructions: TAKE 1 TO 1 AND 1/2 TABLETS BY  MOUTH DAILY OR AS DIRECTED BY  ANTICOAGULATION CLINIC What changed: See the new instructions.        Contact information for follow-up providers     [place] Follow up.   Contact information: [address] [place] [address] [phone]              Contact information for after-discharge care     Destination     [facility] .   Service: Skilled Nursing Contact information: [address] Test Update Address [place] [address] [phone]                    Allergies  Allergen Reactions   Sulfa Antibiotics Other (See Comments)    Granulocytosis   Sulfamethoxazole-Trimethoprim     Other Reaction(s): Unknown   Zestril [Lisinopril] Cough    Consultations: Urology   Procedures:   Discharge Exam: BP 120/62 (BP Location: Left Arm)   Pulse 77   Temp 98 F (36.7 C) (Oral)   Resp 18   Ht 6' (1.829 m)   Wt 74.7 kg   SpO2 96%   BMI 22.34 kg/m  Physical Exam Constitutional:      General: He is not in acute distress.    Appearance: Normal appearance.     Comments: Pleasant and elderly; NAD  HENT:     Head: Normocephalic and atraumatic.     Mouth/Throat:     Mouth: Mucous membranes are moist.  Eyes:     Extraocular Movements: Extraocular movements intact.  Cardiovascular:     Rate and Rhythm: Normal rate and regular rhythm.  Pulmonary:     Effort: Pulmonary  effort is normal. No respiratory distress.     Breath sounds: Normal breath sounds. No wheezing.  Abdominal:     General: Bowel sounds are normal. There is no distension.     Palpations: Abdomen is soft.     Tenderness: There is no abdominal tenderness.  Genitourinary:    Comments: Foley in place with dark yellow urine in bag Musculoskeletal:        General: Normal range of motion.     Cervical back: Normal range of motion and neck supple.  Skin:    General: Skin is warm and dry.     Findings: Bruising (scattered, worse in LUE) present.  Neurological:     General: No focal deficit present.     Mental Status: He is alert.  Psychiatric:        Mood and Affect: Mood normal.        Behavior: Behavior normal.      The results of significant diagnostics from this hospitalization (including imaging, microbiology, ancillary and laboratory) are listed below for reference.   Microbiology: No results found for this or any previous visit (from the past 240 hours).   Labs: BNP (last 3 results) Recent Labs    02/02/23 0806 02/04/23 1629 02/10/23 0453  BNP 432.5* 1,032.1* 349.4*   Basic Metabolic Panel: Recent Labs  Lab 02/16/23 0453 02/17/23 0428 02/18/23 0353 02/19/23 0335 02/20/23 0410  NA 148* 146* 141 140 139  K 3.6 3.3* 3.3* 3.5 3.4*  CL 121* 118* 113* 110 109  CO2 20* 22 22 22 22   GLUCOSE 102* 110* 118* 104* 105*  BUN 16 15  12 15 15   CREATININE 0.93 0.75 0.75 0.71 0.82  CALCIUM 8.5* 8.0* 7.8* 7.9* 8.0*  MG 2.0 1.9 1.9 1.8 2.0  PHOS 2.4* 2.7 2.7 2.8 3.3   Liver Function Tests: Recent Labs  Lab 02/16/23 0453 02/17/23 0428 02/18/23 0353 02/19/23 0335 02/20/23 0410  AST 28 24 23 22 17   ALT 24 22 20 19 15   ALKPHOS 95 92 81 82 75  BILITOT 1.3* 1.2 1.1 1.1 1.1  PROT 6.2* 5.9* 5.5* 5.8* 6.0*  ALBUMIN 2.3* 2.2* 2.0* 2.2* 2.3*   No results for input(s): "LIPASE", "AMYLASE" in the last 168 hours. No results for input(s): "AMMONIA" in the last 168  hours. CBC: Recent Labs  Lab 02/16/23 2107 02/17/23 0428 02/18/23 0353 02/19/23 0335 02/20/23 0410 02/21/23 0418 02/22/23 0410  WBC  --  7.4 7.1 6.5 5.6 5.8 5.5  NEUTROABS 5.6 5.0 4.7 4.2 3.6  --   --   HGB  --  7.9* 7.8* 7.7* 7.6* 8.5* 7.9*  HCT  --  25.3* 24.6* 24.8* 25.1* 28.6* 26.3*  MCV  --  93.0 92.8 92.9 95.1 96.9 95.3  PLT  --  189 197 223 213 241 226   Cardiac Enzymes: No results for input(s): "CKTOTAL", "CKMB", "CKMBINDEX", "TROPONINI" in the last 168 hours. BNP: Invalid input(s): "POCBNP" CBG: Recent Labs  Lab 02/19/23 1656 02/19/23 2006 02/20/23 0107 02/20/23 0442 02/20/23 0728  GLUCAP 99 138* 101* 92 95   D-Dimer No results for input(s): "DDIMER" in the last 72 hours. Hgb A1c No results for input(s): "HGBA1C" in the last 72 hours. Lipid Profile No results for input(s): "CHOL", "HDL", "LDLCALC", "TRIG", "CHOLHDL", "LDLDIRECT" in the last 72 hours. Thyroid function studies No results for input(s): "TSH", "T4TOTAL", "T3FREE", "THYROIDAB" in the last 72 hours.  Invalid input(s): "FREET3" Anemia work up No results for input(s): "VITAMINB12", "FOLATE", "FERRITIN", "TIBC", "IRON", "RETICCTPCT" in the last 72 hours. Urinalysis    Component Value Date/Time   COLORURINE YELLOW 02/04/2023 0118   APPEARANCEUR CLOUDY (A) 02/04/2023 0118   LABSPEC 1.010 02/04/2023 0118   PHURINE 5.0 02/04/2023 0118   GLUCOSEU NEGATIVE 02/04/2023 0118   HGBUR LARGE (A) 02/04/2023 0118   BILIRUBINUR NEGATIVE 02/04/2023 0118   KETONESUR NEGATIVE 02/04/2023 0118   PROTEINUR NEGATIVE 02/04/2023 0118   UROBILINOGEN 0.2 08/01/2010 0924   NITRITE NEGATIVE 02/04/2023 0118   LEUKOCYTESUR LARGE (A) 02/04/2023 0118   Sepsis Labs Recent Labs  Lab 02/19/23 0335 02/20/23 0410 02/21/23 0418 02/22/23 0410  WBC 6.5 5.6 5.8 5.5   Microbiology No results found for this or any previous visit (from the past 240 hours).  Procedures/Studies: CT HEAD WO CONTRAST ( ) Result Date:  02/13/2023 CLINICAL DATA:  Provided history: Stroke, follow-up. EXAM: CT HEAD WITHOUT CONTRAST TECHNIQUE: Contiguous axial images were obtained from the base of the skull through the vertex without intravenous contrast. RADIATION DOSE REDUCTION: This exam was performed according to the departmental dose-optimization program which includes automated exposure control, adjustment of the mA and/or kV according to patient size and/or use of iterative reconstruction technique. COMPARISON:  Brain MRI 02/09/2023.  Noncontrast head CT 02/09/2023. FINDINGS: Brain: Generalized cerebral atrophy. Known small acute white matter infarct within the posterior left frontal lobe. A known small acute white matter infarct within the posterior left temporal lobe is occult by CT and was better appreciated on the recent prior brain MRI of 02/09/2023. Chronic right MCA territory and right cerebellar infarcts again noted. Mild patchy and ill-defined hypoattenuation elsewhere within the cerebral white matter, nonspecific  but compatible chronic small vessel disease. There is no acute intracranial hemorrhage. No evidence of an intracranial mass. No midline shift. Vascular: No hyperdense vessel.  Atherosclerotic calcifications. Skull: No calvarial fracture or aggressive osseous lesion. Sinuses/Orbits: No mass or acute finding within the imaged orbits. No significant paranasal sinus disease at the imaged levels. Other: Right mastoid effusion. IMPRESSION: 1. Known small acute white matter infarcts within the left frontal and left temporal lobes, not appreciably changed in extent from the recent prior brain MRI of 02/09/2023. No evidence of hemorrhagic conversion. 2. Parenchymal atrophy, chronic small vessel ischemic disease and chronic infarcts, as described. 3. Right mastoid effusion. Electronically Signed   By: Jackey Loge D.O.   On: 02/13/2023 12:37   DG Swallowing Func-Speech Pathology Result Date: 02/12/2023 Table formatting from the  original result was not included. Clinical Impressions: Pt presents with a characterized by reduced strength and coordination. Participation was significantly limited by fatigue, mentation, and hesistance to potential gagging. He declined to use straws or take sequential sips, so thin liquids were assessed through single cup sips. Due to incomplete epiglottic inversion and partial laryngeal elevation, thin liquids consistently result in penetration above the level of the vocal folds that does not spontaneously clear (PAS 3). He initiates effective throat clearance as it progresses, indicating intact sensation. After multiple subswallows, penetrates are eventually expelled. There is diffuse pharyngeal residue that increases with thickened liquid consistencies and especially solids. Chin tuck and L head turn postures are ineffective in preventing this penetration or reducing residue. A liquid wash does clear vallecular residue, although any liquid results in a collection of residue in pt's pyriform sinuses. Pt states preference to continue full liquid diet at present but feel that as he is willing, diet could be advanced to modified texture solids. No significant esophageal retention or backflow was noted, although suspected cervical osteophytes contribute to reduced distention at the level of the PES. Recommend continuing full liquid diet as that is with what pt feels most comfortable. He may benefit from ongoing education and intervention to maintain his strength and coordination. SLP will continue following. Recommendations: Full Liquid Diet  02/09/23 1148 SLP Visit Information SLP Received On 02/09/23 Pain Assessment Pain Assessment Faces Faces Pain Scale 4 Pain Location generalized Pain Descriptors / Indicators Discomfort;Grimacing Pain Intervention(s) Monitored during session General Information HPI 87yo male admitted 02/02/23 with SOB 2/2 CHF exacerbation with possible aspiration event. PMH: acquired dilation of  the ascending aorta and aortic root, CAD, AFib, Right basal ganglia CVA (2018), HTN, heart murmur, AVR, OA, BPH, SBO, neuropathy, thrombocytopenia.  PMH+ for dysphagia from CVA 2018 requiring thickener and another CVA that did not impair his swallowing. Pt developed some respiratory difficulties this am - and was placed on 4 L HFNC - s/p surgery with urology stent and then sent from surgery to ICU on vasopressors and Bipap 1/30. Subsequently taken off Bipap and awoke adequately for po. Caregiver present No Diet Prior to this Study Full liquid diet Temperature  Normal Respiratory Status Tachypneic Supplemental O2 None (Room air) History of Recent Intubation No Behavior/Cognition Alert;Cooperative;Pleasant mood Self-Feeding Abilities Dependent for feeding Baseline vocal quality/speech Normal Volitional Cough Able to elicit Volitional Cough Assessment Appears WFL Volitional Swallow Able to elicit Anatomy Suspected cervical osteophytes Orofacial Exam Oral Cavity: Oral Hygiene WFL Oral Cavity - Dentition Adequate natural dentition;Missing dentition Orofacial Anatomy WFL Oral Motor/Sensory Function WFL Boluses Administered Boluses Administered Thin liquids (Level 0);Mildly thick liquids (Level 2, nectar thick);Puree;Solid Oral Impairment Domain Lip Closure Interlabial  escape, no progression to anterior lip Tongue control during bolus hold Cohesive bolus between tongue to palatal seal Bolus preparation/mastication Timely and efficient chewing and mashing Bolus transport/lingual motion Brisk tongue motion Oral residue Trace residue lining oral structures Location of oral residue  Tongue;Palate Initiation of pharyngeal swallow  Pyriform sinuses Pharyngeal Impairment Domain Soft palate elevation No bolus between soft palate (SP)/pharyngeal wall (PW) Laryngeal elevation Partial superior movement of thyroid cartilage/partial approximation of arytenoids to epiglottic petiole Anterior hyoid excursion Partial anterior movement  Epiglottic movement Partial inversion Laryngeal vestibule closure Incomplete, narrow column air/contrast in laryngeal vestibule Pharyngeal stripping wave  Present - diminished Pharyngeal contraction (A/P view only) N/A Pharyngoesophageal segment opening Partial distention/partial duration, partial obstruction of flow Tongue base retraction Trace column of contrast or air between tongue base and PPW Pharyngeal residue Majority of contrast within or on pharyngeal structures Location of pharyngeal residue Tongue base;Valleculae;Pyriform sinuses;Pharyngeal wall;Diffuse (>3 areas) Esophageal Impairment Domain Esophageal clearance upright position Complete clearance, esophageal coating Penetration/Aspiration Scale Score 1.  Material does not enter airway Puree;Solid 3.  Material enters airway, remains ABOVE vocal cords and not ejected out Thin liquids (Level 0) Compensatory Strategies Compensatory strategies Yes Chin tuck Ineffective Ineffective Chin Tuck Thin liquid (Level 0) Left head turn Ineffective Ineffective Left Head Turn Thin liquid (Level 0) Clinical Impression Clinical Impression Pt presents with a characterized by reduced strength and coordination. Participation was significantly limited by fatigue, mentation, and hesistance to potential gagging. He declined to use straws or take sequential sips, so thin liquids were assessed through single cup sips. Due to incomplete epiglottic inversion and partial laryngeal elevation, thin liquids consistently result in penetration above the level of the vocal folds that does not spontaneously clear (PAS 3). He initiates effective throat clearance as it progresses, indicating intact sensation. After multiple subswallows, penetrates are eventually expelled. There is diffuse pharyngeal residue that increases with thickened liquid consistencies and especially solids. Chin tuck and L head turn postures are ineffective in preventing this penetration or reducing residue. A liquid  wash does clear vallecular residue, although any liquid results in a collection of residue in pt's pyriform sinuses. Pt states preference to continue full liquid diet at present but feel that as he is willing, diet could be advanced to modified texture solids. No significant esophageal retention or backflow was noted, although suspected cervical osteophytes contribute to reduced distention at the level of the PES. Recommend continuing full liquid diet as that is with what pt feels most comfortable. He may benefit from ongoing education and intervention to maintain his strength and coordination. SLP will continue following. SLP Visit Diagnosis Dysphagia, oropharyngeal phase (R13.12) Factors that may increase risk of adverse event in presence of aspiration [employer] 2021) Poor general health and/or compromised immunity;Reduced cognitive function;Frail or deconditioned;Dependence for feeding and/or oral hygiene;Inadequate oral hygiene;Reduced saliva Exam Limitations Poor participation;Poor bolus acceptance;Fatigue Swallowing Evaluation Recommendations Recommendations PO diet PO Diet Recommendation Full liquid diet Liquid Administration via Spoon;Cup Medication Administration Whole meds with puree (or continue with Ensure) Supervision Full assist for feeding;Full supervision/cueing for swallowing strategies Swallowing strategies   Minimize environmental distractions;Slow rate;Small bites/sips;Multiple dry swallows after each bite/sip;Follow solids with liquids Postural changes Position pt fully upright for meals;Stay upright 30-60 min after meals Oral care recommendations Oral care BID (2x/day) Treatment Plan Treatment recommendations Therapy as outlined in treatment plan below Follow-up recommendations Outpatient SLP Functional status assessment Patient has had a recent decline in their functional status and demonstrates the ability to make significant improvements  in function in a reasonable and predictable  amount of time. Treatment frequency Min 2x/week Treatment duration 2 weeks Interventions Aspiration precaution training;Oropharyngeal exercises;Patient/family education;Trials of upgraded texture/liquids;Diet toleration management by SLP Goal Planning Prognosis for improved oropharyngeal function Fair Barriers to Reach Goals Cognitive deficits;Time post onset Patient/Family Stated Goal for pt to get better Consulted and agree with results and recommendations Patient SLP Time Calculation SLP Start Time (ACUTE ONLY) 1117 SLP Stop Time (ACUTE ONLY) 1142 SLP Time Calculation (min) (ACUTE ONLY) 25 min SLP Evaluations $ SLP Speech Visit 1 Visit SLP Evaluations $MBS Swallow 1 Procedure   VAS US CAROTID Result Date: 02/10/2023 Carotid Arterial Duplex Study Patient Name:  Gerald Ambrosius.  Date of Exam:   02/10/2023 Medical Rec #: [id number]              Accession #:    [id number] Date of Birth: 1935-03-15              Patient Gender: M Patient Age:   57 years Exam Location:  [facility] Procedure:      VAS US CAROTID Referring Phys: Brooke Dare --------------------------------------------------------------------------------  Indications:       CVA. Risk Factors:      Hypertension, coronary artery disease, prior CVA. Other Factors:     AVR 2012, afib, pulmonary hypertension, mitral and tricuspid                    valve repair. Comparison Study:  Prior carotid duplex done 01/20/18 indicating 1-39% ICA                    stenosis, bilaterally Performing Technologist: Fernande Bras  Examination Guidelines: A complete evaluation includes B-mode imaging, spectral Doppler, color Doppler, and power Doppler as needed of all accessible portions of each vessel. Bilateral testing is considered an integral part of a complete examination. Limited examinations for reoccurring indications may be performed as noted.  Right Carotid Findings: +----------+--------+--------+--------+------------------+------------------+            PSV cm/sEDV cm/sStenosisPlaque DescriptionComments           +----------+--------+--------+--------+------------------+------------------+ CCA Prox  161     28                                intimal thickening +----------+--------+--------+--------+------------------+------------------+ CCA Distal116     14                                intimal thickening +----------+--------+--------+--------+------------------+------------------+ ICA Prox  101     23      1-39%   heterogenous                         +----------+--------+--------+--------+------------------+------------------+ ICA Mid   97      22                                                   +----------+--------+--------+--------+------------------+------------------+ ICA Distal74      19                                                   +----------+--------+--------+--------+------------------+------------------+  ECA       118     12                                                   +----------+--------+--------+--------+------------------+------------------+ +----------+--------+-------+--------+-------------------+           PSV cm/sEDV cmsDescribeArm Pressure (mmHG) +----------+--------+-------+--------+-------------------+ [id number]      18                                 +----------+--------+-------+--------+-------------------+ +---------+--------+---+--------+--+ VertebralPSV cm/s104EDV cm/s21 +---------+--------+---+--------+--+  Left Carotid Findings: +----------+--------+--------+--------+------------------+------------------+           PSV cm/sEDV cm/sStenosisPlaque DescriptionComments           +----------+--------+--------+--------+------------------+------------------+ CCA Prox  115     17                                intimal thickening +----------+--------+--------+--------+------------------+------------------+ CCA Distal112     22                                 intimal thickening +----------+--------+--------+--------+------------------+------------------+ ICA Prox  134     26      1-39%   heterogenous                         +----------+--------+--------+--------+------------------+------------------+ ICA Mid   125     24                                                   +----------+--------+--------+--------+------------------+------------------+ ICA Distal122     32                                                   +----------+--------+--------+--------+------------------+------------------+ ECA       138     12                                                   +----------+--------+--------+--------+------------------+------------------+ +----------+--------+--------+--------+-------------------+           PSV cm/sEDV cm/sDescribeArm Pressure (mmHG) +----------+--------+--------+--------+-------------------+ [id number]     19                                  +----------+--------+--------+--------+-------------------+ +---------+--------+--+--------+--+ VertebralPSV cm/s74EDV cm/s15 +---------+--------+--+--------+--+   Summary: Right Carotid: Velocities in the right ICA are consistent with a 1-39% stenosis. Left Carotid: Velocities in the left ICA are consistent with a 1-39% stenosis. Vertebrals:  Bilateral vertebral arteries demonstrate antegrade flow. Subclavians: Normal flow hemodynamics were seen in bilateral subclavian              arteries. *See table(s) above for measurements and observations.  Electronically signed by  Lemar Livings MD on 02/10/2023 at 4:01:14 PM.    Final    DG CHEST PORT 1 VIEW Result Date: 02/10/2023 CLINICAL DATA:  Respiratory failure EXAM: PORTABLE CHEST 1 VIEW COMPARISON:  02/05/2023 FINDINGS: Right-sided PICC line terminates at the superior caval/atrial junction. The low left hemithorax is minimally excluded. Median sternotomy for aortic valve repair. Left atrial appendage  occlusion device. Midline trachea. Mild cardiomegaly. No right and no definite left pleural effusion. No pneumothorax. Left base scarring or subsegmental atelectasis. No lobar consolidation. IMPRESSION: Cardiomegaly without congestive failure. Electronically Signed   By: Jeronimo Greaves M.D.   On: 02/10/2023 09:22   MR ANGIO HEAD WO CONTRAST Result Date: 02/10/2023 CLINICAL DATA:  Stroke/TIA, determine embolic source EXAM: MRA HEAD WITHOUT CONTRAST TECHNIQUE: Angiographic images of the Circle of Willis were acquired using MRA technique without intravenous contrast. COMPARISON:  None Available. FINDINGS: Anterior circulation: Bilateral intracranial ICAs, MCAs and ACAs are patent without proximal hemodynamically significant stenosis. Posterior circulation: Bilateral intradural vertebral arteries and basilar artery are patent without significant stenosis. Severe stenosis of the right P2 PCA. Left PCA is patent without significant stenosis IMPRESSION: Severe stenosis of the right P2 PCA. Electronically Signed   By: Feliberto Harts M.D.   On: 02/10/2023 00:03   [name] Result Date: 02/09/2023 CLINICAL DATA:  Neuro deficit, acute, stroke suspected. EXAM: MRI HEAD WITHOUT CONTRAST TECHNIQUE: Multiplanar, multiecho pulse sequences of the brain and surrounding structures were obtained without intravenous contrast. COMPARISON:  Head CT 02/09/2023 and MRI 11/09/2021 FINDINGS: Brain: There are small acute infarcts involving subcortical white matter in the posterior left frontal lobe and posterior left temporal lobe. A few small chronic cerebral and cerebellar microhemorrhages are unchanged from the prior MRI. Moderate-sized chronic right MCA and small chronic right cerebellar infarcts are again noted with ex vacuo dilatation of the right lateral ventricle. There is a background of mild chronic small vessel ischemia in the cerebral white matter which is similar to the prior MRI. There is moderate cerebral atrophy  with prominent extra-axial CSF over the right greater than left cerebral convexities being unchanged from the prior MRI and attributed to atrophy and subarachnoid space enlargement. No mass, midline shift, or definite extra-axial fluid collection is identified. Vascular: Major intracranial vascular flow voids are preserved. Skull and upper cervical spine: Unremarkable bone marrow signal. Sinuses/Orbits: Bilateral cataract extraction. Clear paranasal sinuses. Large chronic right mastoid effusion. Other: None. IMPRESSION: 1. Small acute left frontal and left temporal white matter infarcts. 2. Chronic ischemia including old right MCA and right cerebellar infarcts. Electronically Signed   By: Sebastian Ache M.D.   On: 02/09/2023 15:05   CT HEAD CODE STROKE WO CONTRAST Result Date: 02/09/2023 CLINICAL DATA:  Code stroke. Neuro deficit, acute, stroke suspected. EXAM: CT HEAD WITHOUT CONTRAST TECHNIQUE: Contiguous axial images were obtained from the base of the skull through the vertex without intravenous contrast. RADIATION DOSE REDUCTION: This exam was performed according to the departmental dose-optimization program which includes automated exposure control, adjustment of the mA and/or kV according to patient size and/or use of iterative reconstruction technique. COMPARISON:  Head CT 02/16/2022 and MRI 11/09/2021 FINDINGS: Brain: There is no evidence of an acute cortically based infarct, intracranial hemorrhage, mass, midline shift, or extra-axial fluid collection. A moderate-sized chronic right MCA infarct is unchanged with involvement of the operculum, insula, and basal ganglia and associated ex vacuo dilatation of the body and frontal horn of the right lateral ventricle. A new 7 mm discrete hypodense focus in  the subcortical white matter of the posterior left frontal lobe is suggestive of an age indeterminate lacunar infarct. Hypodensities elsewhere in the cerebral white matter bilaterally are unchanged and  nonspecific but compatible with mild chronic small vessel ischemic disease. A small chronic right cerebellar infarct is unchanged. There is moderate cerebral atrophy. Enlarged extra-axial CSF spaces over the frontal convexities are unchanged and attributed to atrophy. Vascular: Calcified atherosclerosis at the skull base. No hyperdense vessel. Skull: No acute fracture or suspicious osseous lesion. Sinuses/Orbits: Visualized paranasal sinuses are clear. Large chronic right mastoid effusion. Bilateral cataract extraction. Other: None. ASPECTS ([place] Stroke Program Early CT Score) - Ganglionic level infarction (caudate, lentiform nuclei, internal capsule, insula, M1-M3 cortex): 7 - Supraganglionic infarction (M4-M6 cortex): 3 Total score (0-10 with 10 being normal): 10 These results were communicated to Dr. Iver Nestle at 1:30 pm on 02/09/2023 by text page via the [facility] messaging system. IMPRESSION: 1. No evidence of acute cortically based infarct or intracranial hemorrhage. ASPECTS of 10. 2. Age indeterminate left frontal white matter lacunar infarct. 3. Chronic ischemia including an old right MCA infarct. Electronically Signed   By: Sebastian Ache M.D.   On: 02/09/2023 13:31   DG C-Arm 1-60 Min-No Report Result Date: 02/07/2023 Fluoroscopy was utilized by the requesting physician.  No radiographic interpretation.   CT RENAL STONE STUDY Result Date: 02/06/2023 CLINICAL DATA:  Abdominal/flank pain.  Concern for kidney stone. EXAM: CT ABDOMEN AND PELVIS WITHOUT CONTRAST TECHNIQUE: Multidetector CT imaging of the abdomen and pelvis was performed following the standard protocol without IV contrast. RADIATION DOSE REDUCTION: This exam was performed according to the departmental dose-optimization program which includes automated exposure control, adjustment of the mA and/or kV according to patient size and/or use of iterative reconstruction technique. COMPARISON:  CT abdomen pelvis dated 09/23/2021. FINDINGS: Evaluation of  this exam is limited in the absence of intravenous contrast. Lower chest: Small bilateral pleural effusions with associated partial compressive atelectasis of the lung bases. Mild cardiomegaly. Tricuspid mechanical bowel. Coronary vascular calcification. No intra-abdominal free air or free fluid. Hepatobiliary: The liver is unremarkable. No biliary dilatation. Cholecystectomy. No retained calcified stone noted in the central CBD. Pancreas: Unremarkable. No pancreatic ductal dilatation or surrounding inflammatory changes. Spleen: Normal in size without focal abnormality. Adrenals/Urinary Tract: The adrenal glands unremarkable. There is a 12 mm stone in the proximal right ureter close to the ureteropelvic junction with mild right hydronephrosis. Several additional nonobstructing right renal calculi measure up to 1 cm in the interpolar aspect of the right kidney. Multiple right renal cysts are poorly characterized on this noncontrast CT but were present on the prior CT. A 12 mm high attenuating exophytic cyst from the inferior pole of the right kidney likely represents a hemorrhagic or proteinaceous cyst. There has been interval development of high attenuating content within a exophytic 8.5 cm cyst arising from the inferior pole of the right kidney consistent with hemorrhage. The possibility of active lesional is not excluded but cannot be evaluated on this noncontrast CT. No significant perinephric fluid. There is no hydronephrosis or nephrolithiasis on the left. The left ureter is unremarkable. The urinary bladder is decompressed around a Foley catheter. Stomach/Bowel: There is sigmoid diverticulosis. There is no bowel obstruction or active inflammation. The appendix is normal. Vascular/Lymphatic: Mild aortoiliac atherosclerotic disease. The IVC is unremarkable. No portal gas. There is no adenopathy. Reproductive: Mildly enlarged prostate gland measuring 5 cm in transverse axial diameter. The seminal vesicles are  symmetric. Other: Midline vertical anterior abdominal wall incisional  scar. There is mild asymmetric enlargement of the left iliacus muscle with an ill-defined high attenuating area suspicious for an intramuscular bleed. Slight asymmetric enlargement of the left psoas muscle also noted. Musculoskeletal: Small fat containing midline supraumbilical hernia. There is osteopenia with degenerative changes of the spine. No acute osseous pathology. Total left hip arthroplasty. IMPRESSION: 1. A 12 mm proximal right ureteral stone with mild right hydronephrosis. 2. Additional nonobstructing right renal calculi. 3. Hemorrhage within the dominant 8.5 cm cyst in the inferior pole of the right kidney. Active bleed or underlying malignancy is not excluded but cannot be evaluated on this noncontrast CT. Urology consult and follow-up recommended to exclude a bleeding mass. 4. Small intramuscular bleed within the left iliopsoas. Correlation with anticoagulation status recommended. 5. Sigmoid diverticulosis. No bowel obstruction. Normal appendix. 6. Small bilateral pleural effusions with associated partial compressive atelectasis of the lung bases. 7.  Aortic Atherosclerosis (ICD10-I70.0). Electronically Signed   By: Elgie Collard M.D.   On: 02/06/2023 10:54   DG CHEST PORT 1 VIEW Result Date: 02/05/2023 CLINICAL DATA:  PICC placement. EXAM: PORTABLE CHEST 1 VIEW COMPARISON:  Chest radiograph dated 02/04/2023. FINDINGS: Right-sided PICC with tip over central SVC. Improved aeration of the lungs compared to prior radiograph. No focal consolidation or pneumothorax. Trace bilateral pleural effusions suspected. Stable cardiac silhouette. Median sternotomy wires and mechanical cardiac valve. No acute osseous pathology. IMPRESSION: 1. Right-sided PICC with tip over central SVC. 2. Improved aeration of the lungs. Electronically Signed   By: Elgie Collard M.D.   On: 02/05/2023 15:12   [place] EKG SITE RITE Result Date: 02/05/2023 If  Site Rite image not attached, placement could not be confirmed due to current cardiac rhythm.  DG CHEST PORT 1 VIEW Result Date: 02/04/2023 CLINICAL DATA:  Shortness of breath. EXAM: PORTABLE CHEST 1 VIEW COMPARISON:  Chest radiograph dated 02/03/2023. FINDINGS: There is cardiomegaly with vascular congestion. No focal consolidation, pleural effusion, pneumothorax. Median sternotomy wires and mechanical cardiac valve. No acute osseous pathology. IMPRESSION: Cardiomegaly with vascular congestion. Electronically Signed   By: Elgie Collard M.D.   On: 02/04/2023 14:25   DG Abd 1 View Result Date: 02/04/2023 CLINICAL DATA:  Nausea and vomiting. EXAM: ABDOMEN - 1 VIEW COMPARISON:  02/03/2023 FINDINGS: Nasogastric tube tip is in the left upper abdomen and likely in the gastric body region. Bowel gas in the abdomen and pelvis with a nonobstructive pattern. Surgical clips in the right abdomen. Left hip arthroplasty. Surgical clips in the right groin. Two large calcifications in the region of the right renal shadow both measuring approximately 1 cm. 1 of these calcifications could be in the renal pelvic region. IMPRESSION: 1. Right renal calculi, largest measuring up to 1.0 cm. One stone could be in the renal pelvic region. This could be better evaluated with CT imaging. 2. Nonobstructive bowel gas pattern. 3. Tip of the nasogastric tube is in the gastric body region. Electronically Signed   By: Richarda Overlie M.D.   On: 02/04/2023 10:04   ECHOCARDIOGRAM COMPLETE Result Date: 02/03/2023    ECHOCARDIOGRAM REPORT   Patient Name:   Gerald Ocallaghan. Date of Exam: 02/03/2023 Medical Rec #:  [id number]             Height:       72.0 in Accession #:    [id number]            Weight:       173.3 lb Date of Birth:  09/04/1935  BSA:          2.005 m Patient Age:    87 years              BP:           91/64 mmHg Patient Gender: M                     HR:           76 bpm. Exam Location:  Inpatient Procedure: 2D Echo,  Cardiac Doppler, Color Doppler and Intracardiac            Opacification Agent Indications:    TIA  History:        Patient has prior history of Echocardiogram examinations, most                 recent 03/19/2022. Previous Myocardial Infarction and CAD, COPD,                 Arrythmias:Atrial Fibrillation; Risk Factors:Hypertension.                 Aortic Valve: bioprosthetic valve is present in the aortic                 position.                 Mitral Valve: prosthetic annuloplasty ring valve is present in                 the mitral position.  Sonographer:    Webb Laws Referring Phys: [id number] Lunabelle Oatley MANUEL ORTIZ IMPRESSIONS  1. S/P MV repair with mean gradient 8 mmHg and no MR; s/p AVR with mean gradient 14 mmHg and no AI; s/p TV repair with mild TR.  2. Left ventricular ejection fraction, by estimation, is 60 to 65%. The left ventricle has normal function. The left ventricle has no regional wall motion abnormalities. There is moderate left ventricular hypertrophy. Left ventricular diastolic parameters are indeterminate. There is the interventricular septum is flattened in systole and diastole, consistent with right ventricular pressure and volume overload.  3. Right ventricular systolic function is normal. The right ventricular size is mildly enlarged. There is severely elevated pulmonary artery systolic pressure.  4. Left atrial size was severely dilated.  5. Right atrial size was severely dilated.  6. The mitral valve has been repaired/replaced. No evidence of mitral valve regurgitation. No evidence of mitral stenosis. There is a prosthetic annuloplasty ring present in the mitral position.  7. The tricuspid valve is has been repaired/replaced. The tricuspid valve is status post repair with an annuloplasty ring.  8. The aortic valve has been repaired/replaced. Aortic valve regurgitation is not visualized. No aortic stenosis is present. There is a bioprosthetic valve present in the aortic position.  9. The  inferior vena cava is dilated in size with <50% respiratory variability, suggesting right atrial pressure of 15 mmHg. FINDINGS  Left Ventricle: Left ventricular ejection fraction, by estimation, is 60 to 65%. The left ventricle has normal function. The left ventricle has no regional wall motion abnormalities. Definity contrast agent was given IV to delineate the left ventricular  endocardial borders. The left ventricular internal cavity size was normal in size. There is moderate left ventricular hypertrophy. The interventricular septum is flattened in systole and diastole, consistent with right ventricular pressure and volume overload. Left ventricular diastolic parameters are indeterminate. Right Ventricle: The right ventricular size is mildly enlarged. Right ventricular systolic function is normal. There is  severely elevated pulmonary artery systolic pressure. The tricuspid regurgitant velocity is 3.39 m/s, and with an assumed right atrial  pressure of 15 mmHg, the estimated right ventricular systolic pressure is 61.0 mmHg. Left Atrium: Left atrial size was severely dilated. Right Atrium: Right atrial size was severely dilated. Pericardium: There is no evidence of pericardial effusion. Mitral Valve: The mitral valve has been repaired/replaced. No evidence of mitral valve regurgitation. There is a prosthetic annuloplasty ring present in the mitral position. No evidence of mitral valve stenosis. MV peak gradient, 22.5 mmHg. The mean mitral valve gradient is 8.0 mmHg. Tricuspid Valve: The tricuspid valve is has been repaired/replaced. Tricuspid valve regurgitation is mild . No evidence of tricuspid stenosis. The tricuspid valve is status post repair with an annuloplasty ring. Aortic Valve: The aortic valve has been repaired/replaced. Aortic valve regurgitation is not visualized. No aortic stenosis is present. Aortic valve mean gradient measures 14.2 mmHg. Aortic valve peak gradient measures 17.8 mmHg. Aortic valve  area, by VTI measures 1.60 cm. There is a bioprosthetic valve present in the aortic position. Pulmonic Valve: The pulmonic valve was normal in structure. Pulmonic valve regurgitation is mild. No evidence of pulmonic stenosis. Aorta: The aortic root is normal in size and structure. Venous: The inferior vena cava is dilated in size with less than 50% respiratory variability, suggesting right atrial pressure of 15 mmHg. IAS/Shunts: No atrial level shunt detected by color flow Doppler. Additional Comments: S/P MV repair with mean gradient 8 mmHg and no MR; s/p AVR with mean gradient 14 mmHg and no AI; s/p TV repair with mild TR.  LEFT VENTRICLE PLAX 2D LVIDd:         4.50 cm      Diastology LVIDs:         3.50 cm      LV e' medial:    3.37 cm/s LV PW:         1.20 cm      LV E/e' medial:  59.6 LV IVS:        1.40 cm      LV e' lateral:   7.40 cm/s LVOT diam:     2.10 cm      LV E/e' lateral: 27.2 LV SV:         72 LV SV Index:   36 LVOT Area:     3.46 cm  LV Volumes (MOD) LV vol d, MOD A2C: 120.0 ml LV vol d, MOD A4C: 98.9 ml LV vol s, MOD A2C: 55.1 ml LV vol s, MOD A4C: 41.1 ml LV SV MOD A2C:     64.9 ml LV SV MOD A4C:     98.9 ml LV SV MOD BP:      60.2 ml RIGHT VENTRICLE             IVC RV Basal diam:  4.65 cm     IVC diam: 3.20 cm RV Mid diam:    4.20 cm RV S prime:     10.30 cm/s TAPSE (M-mode): 1.6 cm LEFT ATRIUM              Index        RIGHT ATRIUM           Index LA diam:        4.70 cm  2.34 cm/m   RA Area:     21.10 cm LA Vol (A2C):   141.0 ml 70.34 ml/m  RA Volume:   54.90 ml  27.39 ml/m LA  Vol (A4C):   98.7 ml  49.24 ml/m LA Biplane Vol: 120.0 ml 59.86 ml/m  AORTIC VALVE                     PULMONIC VALVE AV Area (Vmax):    1.99 cm      PR End Diast Vel: 1.85 msec AV Area (Vmean):   1.63 cm AV Area (VTI):     1.60 cm AV Vmax:           211.12 cm/s AV Vmean:          176.800 cm/s AV VTI:            0.452 m AV Peak Grad:      17.8 mmHg AV Mean Grad:      14.2 mmHg LVOT Vmax:         121.00 cm/s  LVOT Vmean:        83.000 cm/s LVOT VTI:          0.209 m LVOT/AV VTI ratio: 0.46  AORTA Ao Root diam: 3.30 cm Ao Asc diam:  3.50 cm MITRAL VALVE                TRICUSPID VALVE MV Area (PHT): 1.86 cm     TR Peak grad:   46.0 mmHg MV Area VTI:   1.10 cm     TR Vmax:        339.00 cm/s MV Peak grad:  22.5 mmHg MV Mean grad:  8.0 mmHg     SHUNTS MV Vmax:       2.37 m/s     Systemic VTI:  0.21 m MV Vmean:      132.0 cm/s   Systemic Diam: 2.10 cm MV Decel Time: 408 msec MV E velocity: 201.00 cm/s MV A velocity: 55.60 cm/s MV E/A ratio:  3.62 Olga Millers MD Electronically signed by Olga Millers MD Signature Date/Time: 02/03/2023/3:57:16 PM    Final    DG Abd 1 View Result Date: 02/03/2023 CLINICAL DATA:  Nasogastric tube placement. EXAM: ABDOMEN - 1 VIEW COMPARISON:  Earlier today FINDINGS: The enteric tube has been advanced, the tip and side port are below the diaphragm in the stomach. No bowel dilatation in the upper abdomen. Right upper quadrant surgical clips. IMPRESSION: Enteric tube tip and side port below the diaphragm in the stomach. Electronically Signed   By: Narda Rutherford M.D.   On: 02/03/2023 15:50   DG Abd 1 View Result Date: 02/03/2023 CLINICAL DATA:  Nausea and vomiting EXAM: ABDOMEN - 1 VIEW; PORTABLE ABDOMEN - 1 VIEW COMPARISON:  None Available. FINDINGS: Sequential images of the upper abdomen demonstrate placement of an esophagogastric tube, tip below the diaphragm, side port above the gastroesophageal junction. Nonobstructive pattern of included bowel gas. No free air on supine radiographs. Cardiomegaly status post median sternotomy with valvular prosthesis and left atrial appendage clip. IMPRESSION: 1. Sequential images of the upper abdomen demonstrate placement of an esophagogastric tube, tip below the diaphragm, side port above the gastroesophageal junction. Recommend advancement. 2. Nonobstructive pattern of included bowel gas. Electronically Signed   By: Jearld Lesch M.D.   On:  02/03/2023 13:22   DG Abd Portable 1V Result Date: 02/03/2023 CLINICAL DATA:  Nausea and vomiting EXAM: ABDOMEN - 1 VIEW; PORTABLE ABDOMEN - 1 VIEW COMPARISON:  None Available. FINDINGS: Sequential images of the upper abdomen demonstrate placement of an esophagogastric tube, tip below the diaphragm, side port above the gastroesophageal junction. Nonobstructive pattern of  included bowel gas. No free air on supine radiographs. Cardiomegaly status post median sternotomy with valvular prosthesis and left atrial appendage clip. IMPRESSION: 1. Sequential images of the upper abdomen demonstrate placement of an esophagogastric tube, tip below the diaphragm, side port above the gastroesophageal junction. Recommend advancement. 2. Nonobstructive pattern of included bowel gas. Electronically Signed   By: Jearld Lesch M.D.   On: 02/03/2023 13:22   DG CHEST PORT 1 VIEW Result Date: 02/03/2023 CLINICAL DATA:  Hypoxia, status post aortic valve replacement EXAM: PORTABLE CHEST 1 VIEW COMPARISON:  02/02/2023 FINDINGS: Cardiomegaly status post median sternotomy with aortic valve prosthesis and left atrial appendage clip. Similar retrocardiac atelectasis. Mild diffuse interstitial opacity. No acute osseous findings. IMPRESSION: 1. Cardiomegaly status post median sternotomy with aortic valve prosthesis and left atrial appendage clip. 2. Similar retrocardiac atelectasis. Mild diffuse interstitial opacity, consistent with edema. Electronically Signed   By: Jearld Lesch M.D.   On: 02/03/2023 12:22   DG Chest Portable 1 View Result Date: 02/02/2023 CLINICAL DATA:  Shortness of breath. EXAM: PORTABLE CHEST 1 VIEW COMPARISON:  02/16/2022 FINDINGS: The cardio pericardial silhouette is enlarged. Diffuse interstitial opacity suggests edema. No focal lung consolidation. No substantial pleural effusion. Bones are diffusely demineralized. Telemetry leads overlie the chest. IMPRESSION: Enlargement of the cardiopericardial silhouette with  diffuse interstitial opacity suggesting edema. Electronically Signed   By: Kennith Center M.D.   On: 02/02/2023 08:21     Time coordinating discharge: Over 30 minutes    Lewie Chamber, MD  [facility] 02/22/2023, 9:45 AM

## 2023-02-20 NOTE — Progress Notes (Signed)
Discharge instructions reviewed, all questions answered. Foley care education complete. Primary RN made aware.

## 2023-02-20 NOTE — Progress Notes (Signed)
Progress Note    [name].   ZOX:[id number]  DOB: December 26, 1935  DOA: 02/02/2023     17 PCP: Jarrett Soho, PA-C  Initial CC: SOB  Hospital Course: [name]. is a 88 y.o. male who has an extensive PMH as below, significant for CAD, CVA, aortic valve replacement 2012, hypertension, pulmonary hypertension, atrial fibrillation and small bowel obstruction. He presented to [facility] ED 1/25 with dyspnea that woke him from his sleep the night prior. He had also complained of LE edema over the preceding few weeks. On EMS arrival, he had sats of 85% on room air. He was placed on [place] O2 and given Solumedrol and DuoNebs in route to ED.   In ED, CXR was suggestive of mild edema. He was admitted by [facility] and started on diuresis. He was seen by cardiology in consultation who agreed with diuresis for fluid overload and obtaining an echo. Echo was obtained and demonstrated EF 60-65%, mod LVH, RV pressure overload with RVSP 61, LA and RA severely dilated.   On evening of 1/26, he developed nausea and vomiting with probable aspiration event. There was concern for SBO and was seen by general surgery who recommended continuing NPO and continuing NGT to suction.   On 1/27, diuresis was held due to bump in renal function after IV lasix. He was only net negative since admit. He did not appear to have excess volume per cardiology notes and appeared improved from CHF standpoint. Dyspnea and hypoxia were felt to more likely be related to aspiration PNA.  General surgery had also seen and he had benign exam and was passing flatus. He unfortunately pulled out his NGT that day.   After pulling out NGT 1/27, he had episode of tachypnea and increased WOB along with spike in temp to 102.63F. Due to increased WOB, PCCM asked to see in consultation. He had been on 4L [place] prior to this episode.   Blood cultures from admission returned positive for Enterobacter cloacae. He was on Cefepime escalated to  Meropenem.  Family report concerns of pt aspirating for over a week prior to admit. Further GOC clarified with pt's son, [name]/ HPOA, changed to DNR/ DNI.    CT scan on 1/29 showed mild right hydro with 12 mm right proximal ureteral stone.  Urology was c/s and he's now s/p cystoscopy with R ureteral stent placement.   CT also showed hemorrhage within the dominant 8.5 cm cyst in the inferior pole of the right kidney as well as a small intramuscular bleed within the left iliopsoas.    He was found to have new facial droop and L sided weakness and altered speech on 2/1.  MRI showed acute L frontal and L temporal white matter infarcts.  He was seen by neurology.    Anticoagulation was held.  A repeat head CT on 2/5 showed no evidence of hemorrhagic conversion.  Heparin was restarted on 2/5 PM.    He's completed  antibiotics for his enterococcus bacteremia.  He appears stable with reinitiation of his warfarin, his INR is at goal.  Hgb remained stable at discharge also.    Assessment & Plan:   Principal Problem:   Acute respiratory failure with hypoxia (HCC) Active Problems:   Essential hypertension   Permanent atrial fibrillation (HCC)   CAD (coronary artery disease)   s/p mitral valve repair   Anemia due to chronic blood loss   Benign prostatic hyperplasia with lower urinary tract symptoms   Thrombocytopenia (  HCC)   Gout   Hypokalemia   Left hemiparesis (HCC)   S/P AVR (aortic valve replacement)   History of ischemic right MCA stroke   Hyperglycemia   S/P tricuspid valve repair   S/P Maze operation for atrial fibrillation   Prediabetes   Protein-calorie malnutrition, moderate (HCC)   HLD (hyperlipidemia)   Other secondary pulmonary hypertension (HCC)   Elevated troponin   Volume overload   Aspiration pneumonia (HCC)   DNR (do not resuscitate)   Ureteral calculus, right   Nephrolithiasis   Sepsis due to Enterobacter [facility])   Renal cyst    Acute ischemic stroke: Thought to be  related to FFP/platelet infusion MRI brain on 2/1 showed small acute left frontal and left temporal white matter infarct. MR angio showed severe stenosis of right P2 PCA Neurology follow up appreciated -> 02/10/2023 note Repeat head CT without evidence of hemorrhagic conversion  Seen by SLP- dysphagia 2 diet. He's now on warfarin    Acute hypoxic respiratory failure-multifactorial in the setting of aspiration pneumonia, fluid overload/CHF exacerbation: 02/09/2023: Off of oxygen. Continue aspiration precautions.   Continue pulmonary hygiene. Dysphagia diet.   Enterobacter Colace bacteremia in the setting of UTI  right ureteral stone with mild hydronephrosis: Septic shock-resolved 1/30 was not a candidate for percutaneous  nephrostomy tube given elevated INR and thrombocytopenia.  S/p cystoscopy with right ureteral stent placement 1/30 Off of pressors now.  Fever improved. WBC improved. Cefepime changed to Meropenem due to encephalopathy. Continue antibiotics for total 14 days (antibiotics now complete). He has PICC line placed 02/05/23; removed prior to discharge  1 of 2 cultures from 1/29 with staph epidermidis, suspect contaminant   Indwelling Foley Catheter He's on flomax and finasteride - continue foley at discharge with outpt followup with urology    Left iliopsoas intramuscular bleed Right renal cyst with hemorrhage  LUE Ecchymoses Discussed renal cyst hemorrhage with urology, ok with resuming anticoagulation   Ileus  NGT removed 1/27, passing flatus Rectal tube is no longer in place   Hypernatremia: improved   AKI: Baseline creatinine 0.9.  Worsened after IV diuresis and shock.  Mild right hydronephrosis with right renal calculi.  Status post stent placement on 1/30 by urology.  Renal function improved.   Afib - on coumadin Valvular disease s/p bioprosthetic AVR 2012, w/ MV and TV repair  CAD, non-obstructive  HFpEF: Remain euvolemic.  Continue to hold on beta-blocker  and Coumadin.  Cardiology signed off on 02/06/2023.   Anemia of chronic disease Acute on chronic Thrombocytopenia Coagulapathy on coumadin  S/p 2 units platelets, 1 unit FFP, 4 units pRBC's  Transfuse additional unit pRBC today With repeat head CT without evidence of hemorrhagic conversion Cardiology notably has recommended heparin bridge for INR <2 Per oncology, anemia thought due to sepsis, bleeding, renal failure, and phlebotomy - Hb expected to improve over next several weeks Dr. Truett Perna -> planning for outpatient follow up (may have early MDS vs anemia related to chronic blood loss), he notes it'll take weeks for Hb to recover   Moderate protein calorie malnutrition:  Dietitian on board Encourage oral diet, supplements.   Hyperglycemia A1c 6.2 SSI  Interval History:  No events overnight. Originally planned d/c today but patient now amenable to SNF.    Old records reviewed in assessment of this patient  Antimicrobials:   DVT prophylaxis:   warfarin (COUMADIN) tablet 7.5 mg   Code Status:   Code Status: Full Code  Mobility Assessment (Last 72 Hours)  Mobility Assessment     Row Name 02/20/23 1115 02/20/23 1035 02/20/23 0320 02/19/23 2000 02/19/23 1157   Does patient have an order for bedrest or is patient medically unstable -- No - Continue assessment No - Continue assessment No - Continue assessment --   What is the highest level of mobility based on the progressive mobility assessment? Level 5 (Walks with assist in room/hall) - Balance while stepping forward/back and can walk in room with assist - Complete Level 5 (Walks with assist in room/hall) - Balance while stepping forward/back and can walk in room with assist - Complete Level 5 (Walks with assist in room/hall) - Balance while stepping forward/back and can walk in room with assist - Complete Level 5 (Walks with assist in room/hall) - Balance while stepping forward/back and can walk in room with assist - Complete  Level 5 (Walks with assist in room/hall) - Balance while stepping forward/back and can walk in room with assist - Complete   Is the above level different from baseline mobility prior to current illness? -- Yes - Recommend PT order Yes - Recommend PT order Yes - Recommend PT order --    Row Name 02/19/23 0900 02/18/23 2020 02/18/23 1100 02/17/23 2000     Does patient have an order for bedrest or is patient medically unstable No - Continue assessment No - Continue assessment No - Continue assessment No - Continue assessment    What is the highest level of mobility based on the progressive mobility assessment? Level 5 (Walks with assist in room/hall) - Balance while stepping forward/back and can walk in room with assist - Complete Level 5 (Walks with assist in room/hall) - Balance while stepping forward/back and can walk in room with assist - Complete Level 5 (Walks with assist in room/hall) - Balance while stepping forward/back and can walk in room with assist - Complete Level 5 (Walks with assist in room/hall) - Balance while stepping forward/back and can walk in room with assist - Complete    Is the above level different from baseline mobility prior to current illness? Yes - Recommend PT order Yes - Recommend PT order Yes - Recommend PT order Yes - Recommend PT order             Barriers to discharge: none Disposition Plan:  SNF Status is: Inpt  Objective: Blood pressure (!) 115/46, pulse (!) 42, temperature 98.3 F (36.8 C), temperature source Oral, resp. rate 14, height 6' (1.829 m), weight 75.8 kg, SpO2 97%.  Examination:  Physical Exam Constitutional:      General: He is not in acute distress.    Appearance: Normal appearance.     Comments: Pleasant and elderly; NAD  HENT:     Head: Normocephalic and atraumatic.     Mouth/Throat:     Mouth: Mucous membranes are moist.  Eyes:     Extraocular Movements: Extraocular movements intact.  Cardiovascular:     Rate and Rhythm: Normal  rate and regular rhythm.  Pulmonary:     Effort: Pulmonary effort is normal. No respiratory distress.     Breath sounds: Normal breath sounds. No wheezing.  Abdominal:     General: Bowel sounds are normal. There is no distension.     Palpations: Abdomen is soft.     Tenderness: There is no abdominal tenderness.  Genitourinary:    Comments: Foley in place with dark yellow urine in bag Musculoskeletal:        General: Normal range of motion.  Cervical back: Normal range of motion and neck supple.  Skin:    General: Skin is warm and dry.     Findings: Bruising (scattered, worse in LUE) present.  Neurological:     General: No focal deficit present.     Mental Status: He is alert.  Psychiatric:        Mood and Affect: Mood normal.        Behavior: Behavior normal.      Consultants:    Procedures:    Data Reviewed: Results for orders placed or performed during the hospital encounter of 02/02/23 (from the past 24 hours)  Glucose, capillary     Status: None   Collection Time: 02/19/23  4:56 PM  Result Value Ref Range   Glucose-Capillary 99 70 - 99 mg/dL  Glucose, capillary     Status: Abnormal   Collection Time: 02/19/23  8:06 PM  Result Value Ref Range   Glucose-Capillary 138 (H) 70 - 99 mg/dL  Glucose, capillary     Status: Abnormal   Collection Time: 02/20/23  1:07 AM  Result Value Ref Range   Glucose-Capillary 101 (H) 70 - 99 mg/dL  Protime-INR     Status: Abnormal   Collection Time: 02/20/23  4:10 AM  Result Value Ref Range   Prothrombin Time 22.2 (H) 11.4 - 15.2 seconds   INR 1.9 (H) 0.8 - 1.2  CBC with Differential/Platelet     Status: Abnormal   Collection Time: 02/20/23  4:10 AM  Result Value Ref Range   WBC 5.6 4.0 - 10.5 K/uL   RBC 2.64 (L) 4.22 - 5.81 MIL/uL   Hemoglobin 7.6 (L) 13.0 - 17.0 g/dL   HCT 69.6 (L) 29.5 - 28.4 %   MCV 95.1 80.0 - 100.0 fL   MCH 28.8 26.0 - 34.0 pg   MCHC 30.3 30.0 - 36.0 g/dL   RDW 13.2 (H) 44.0 - 10.2 %   Platelets 213  150 - 400 K/uL   nRBC 0.0 0.0 - 0.2 %   Neutrophils Relative % 65 %   Neutro Abs 3.6 1.7 - 7.7 K/uL   Lymphocytes Relative 21 %   Lymphs Abs 1.2 0.7 - 4.0 K/uL   Monocytes Relative 11 %   Monocytes Absolute 0.6 0.1 - 1.0 K/uL   Eosinophils Relative 3 %   Eosinophils Absolute 0.2 0.0 - 0.5 K/uL   Basophils Relative 0 %   Basophils Absolute 0.0 0.0 - 0.1 K/uL   Immature Granulocytes 0 %   Abs Immature Granulocytes 0.02 0.00 - 0.07 K/uL  Comprehensive metabolic panel     Status: Abnormal   Collection Time: 02/20/23  4:10 AM  Result Value Ref Range   Sodium 139 135 - 145 mmol/L   Potassium 3.4 (L) 3.5 - 5.1 mmol/L   Chloride 109 98 - 111 mmol/L   CO2 22 22 - 32 mmol/L   Glucose, Bld 105 (H) 70 - 99 mg/dL   BUN 15 8 - 23 mg/dL   Creatinine, Ser 7.25 0.61 - 1.24 mg/dL   Calcium 8.0 (L) 8.9 - 10.3 mg/dL   Total Protein 6.0 (L) 6.5 - 8.1 g/dL   Albumin 2.3 (L) 3.5 - 5.0 g/dL   AST 17 15 - 41 U/L   ALT 15 0 - 44 U/L   Alkaline Phosphatase 75 38 - 126 U/L   Total Bilirubin 1.1 0.0 - 1.2 mg/dL   GFR, Estimated >36 >64 mL/min   Anion gap 8 5 - 15  Magnesium  Status: None   Collection Time: 02/20/23  4:10 AM  Result Value Ref Range   Magnesium 2.0 1.7 - 2.4 mg/dL  Phosphorus     Status: None   Collection Time: 02/20/23  4:10 AM  Result Value Ref Range   Phosphorus 3.3 2.5 - 4.6 mg/dL  Glucose, capillary     Status: None   Collection Time: 02/20/23  4:42 AM  Result Value Ref Range   Glucose-Capillary 92 70 - 99 mg/dL  Glucose, capillary     Status: None   Collection Time: 02/20/23  7:28 AM  Result Value Ref Range   Glucose-Capillary 95 70 - 99 mg/dL    I have reviewed pertinent nursing notes, vitals, labs, and images as necessary. I have ordered labwork to follow up on as indicated.  I have reviewed the last notes from staff over past 24 hours. I have discussed patient's care plan and test results with nursing staff, CM/SW, and other staff as appropriate.  Time spent:  Greater than 50% of the 55 minute visit was spent in counseling/coordination of care for the patient as laid out in the A&P.   LOS: 17 days   Lewie Chamber, MD [facility] 02/20/2023, 4:46 PM

## 2023-02-21 DIAGNOSIS — J9601 Acute respiratory failure with hypoxia: Secondary | ICD-10-CM | POA: Diagnosis not present

## 2023-02-21 DIAGNOSIS — N201 Calculus of ureter: Secondary | ICD-10-CM | POA: Diagnosis not present

## 2023-02-21 DIAGNOSIS — A4159 Other Gram-negative sepsis: Secondary | ICD-10-CM | POA: Diagnosis not present

## 2023-02-21 DIAGNOSIS — I10 Essential (primary) hypertension: Secondary | ICD-10-CM | POA: Diagnosis not present

## 2023-02-21 LAB — CBC
HCT: 28.6 % — ABNORMAL LOW (ref 39.0–52.0)
Hemoglobin: 8.5 g/dL — ABNORMAL LOW (ref 13.0–17.0)
MCH: 28.8 pg (ref 26.0–34.0)
MCHC: 29.7 g/dL — ABNORMAL LOW (ref 30.0–36.0)
MCV: 96.9 fL (ref 80.0–100.0)
Platelets: 241 10*3/uL (ref 150–400)
RBC: 2.95 MIL/uL — ABNORMAL LOW (ref 4.22–5.81)
RDW: 17.8 % — ABNORMAL HIGH (ref 11.5–15.5)
WBC: 5.8 10*3/uL (ref 4.0–10.5)
nRBC: 0 % (ref 0.0–0.2)

## 2023-02-21 LAB — PROTIME-INR
INR: 1.9 — ABNORMAL HIGH (ref 0.8–1.2)
Prothrombin Time: 22.3 s — ABNORMAL HIGH (ref 11.4–15.2)

## 2023-02-21 MED ORDER — DOCUSATE SODIUM 100 MG PO CAPS
100.0000 mg | ORAL_CAPSULE | Freq: Every day | ORAL | Status: DC
  Administered 2023-02-21 (×2): 100 mg via ORAL
  Filled 2023-02-21 (×2): qty 1

## 2023-02-21 MED ORDER — WARFARIN SODIUM 5 MG PO TABS
7.5000 mg | ORAL_TABLET | Freq: Once | ORAL | Status: AC
  Administered 2023-02-21: 7.5 mg via ORAL
  Filled 2023-02-21: qty 1

## 2023-02-21 NOTE — TOC Progression Note (Addendum)
Transition of Care (TOC) - Progression Note    Patient Details  Name: [name]. MRN: [id number] Date of Birth: 06-02-1935  Transition of Care [facility] CM/SW Contact  Larrie Kass, LCSW Phone Number: 02/21/2023, 10:22 AM  Clinical Narrative:     CSW received a message that the pt has changed their mind about going home with home health and would like SNF placement. CSW spoke with the pt's son to discuss bed offers. CSW explained that the bed offers from last time may not be valid, but she would reach out to the facility they are interested in to see if they can still accept the pt. The p's son would like to review the bed offers and will call the CSW back. CSW informed pt's son insurance authorization will need to be done.TOC to follow.   Adden 12:27pm CSW spoke with pt's son , he has chosen [facility]. CSW to start insurance authorization. TOC to follow.   Expected Discharge Plan and Services In-house Referral: Clinical Social Work     Living arrangements for the past 2 months: Single Family Home Expected Discharge Date: 02/20/23                                     Social Determinants of Health (SDOH) Interventions SDOH Screenings   Food Insecurity: No Food Insecurity (02/03/2023)  Housing: Low Risk  (02/03/2023)  Transportation Needs: No Transportation Needs (02/03/2023)  Utilities: Not At Risk (02/03/2023)  Alcohol Screen: Low Risk  (06/02/2021)  Depression (PHQ2-9): Low Risk  (06/16/2021)  Financial Resource Strain: Low Risk  (06/16/2021)  Social Connections: Unknown (02/03/2023)  Stress: No Stress Concern Present (06/16/2021)  Tobacco Use: Low Risk  (02/07/2023)    Readmission Risk Interventions    02/11/2023    4:00 PM 11/10/2021    3:54 PM  Readmission Risk Prevention Plan  Transportation Screening Complete Complete  PCP or Specialist Appt within 3-5 Days Not Complete Complete  Not Complete comments Patient will discharge to SNF.   HRI or Home  Care Consult Complete Complete  Social Work Consult for Recovery Care Planning/Counseling Complete Complete  Palliative Care Screening Not Applicable Not Applicable  Medication Review Oceanographer) Complete Complete

## 2023-02-21 NOTE — Progress Notes (Signed)
Progress Note    [name].   ZOX:[id number]  DOB: 02-Feb-1935  DOA: 02/02/2023     18 PCP: Jarrett Soho, PA-C  Initial CC: SOB  Hospital Course: [name]. is a 88 y.o. male who has an extensive PMH as below, significant for CAD, CVA, aortic valve replacement 2012, hypertension, pulmonary hypertension, atrial fibrillation and small bowel obstruction. He presented to [facility] ED 1/25 with dyspnea that woke him from his sleep the night prior. He had also complained of LE edema over the preceding few weeks. On EMS arrival, he had sats of 85% on room air. He was placed on [place] O2 and given Solumedrol and DuoNebs in route to ED.   In ED, CXR was suggestive of mild edema. He was admitted by [facility] and started on diuresis. He was seen by cardiology in consultation who agreed with diuresis for fluid overload and obtaining an echo. Echo was obtained and demonstrated EF 60-65%, mod LVH, RV pressure overload with RVSP 61, LA and RA severely dilated.   On evening of 1/26, he developed nausea and vomiting with probable aspiration event. There was concern for SBO and was seen by general surgery who recommended continuing NPO and continuing NGT to suction.   On 1/27, diuresis was held due to bump in renal function after IV lasix. He was only net negative since admit. He did not appear to have excess volume per cardiology notes and appeared improved from CHF standpoint. Dyspnea and hypoxia were felt to more likely be related to aspiration PNA.  General surgery had also seen and he had benign exam and was passing flatus. He unfortunately pulled out his NGT that day.   After pulling out NGT 1/27, he had episode of tachypnea and increased WOB along with spike in temp to 102.57F. Due to increased WOB, PCCM asked to see in consultation. He had been on 4L [place] prior to this episode.   Blood cultures from admission returned positive for Enterobacter cloacae. He was on Cefepime escalated to  Meropenem.  Family report concerns of pt aspirating for over a week prior to admit. Further GOC clarified with pt's son, [name]/ HPOA, changed to DNR/ DNI.    CT scan on 1/29 showed mild right hydro with 12 mm right proximal ureteral stone.  Urology was c/s and he's now s/p cystoscopy with R ureteral stent placement.   CT also showed hemorrhage within the dominant 8.5 cm cyst in the inferior pole of the right kidney as well as a small intramuscular bleed within the left iliopsoas.    He was found to have new facial droop and L sided weakness and altered speech on 2/1.  MRI showed acute L frontal and L temporal white matter infarcts.  He was seen by neurology.    Anticoagulation was held.  A repeat head CT on 2/5 showed no evidence of hemorrhagic conversion.  Heparin was restarted on 2/5 PM.    He's completed  antibiotics for his enterococcus bacteremia.  He appears stable with reinitiation of his warfarin, his INR is at goal.  Hgb remained stable at discharge also.    Assessment & Plan:   Principal Problem:   Acute respiratory failure with hypoxia (HCC) Active Problems:   Essential hypertension   Permanent atrial fibrillation (HCC)   CAD (coronary artery disease)   s/p mitral valve repair   Anemia due to chronic blood loss   Benign prostatic hyperplasia with lower urinary tract symptoms   Thrombocytopenia (  HCC)   Gout   Hypokalemia   Left hemiparesis (HCC)   S/P AVR (aortic valve replacement)   History of ischemic right MCA stroke   Hyperglycemia   S/P tricuspid valve repair   S/P Maze operation for atrial fibrillation   Prediabetes   Protein-calorie malnutrition, moderate (HCC)   HLD (hyperlipidemia)   Other secondary pulmonary hypertension (HCC)   Elevated troponin   Volume overload   Aspiration pneumonia (HCC)   DNR (do not resuscitate)   Ureteral calculus, right   Nephrolithiasis   Sepsis due to Enterobacter [facility])   Renal cyst    Acute ischemic stroke: Thought to be  related to FFP/platelet infusion MRI brain on 2/1 showed small acute left frontal and left temporal white matter infarct. MR angio showed severe stenosis of right P2 PCA Neurology follow up appreciated -> 02/10/2023 note Repeat head CT without evidence of hemorrhagic conversion  Seen by SLP- dysphagia 2 diet. He's now on warfarin    Acute hypoxic respiratory failure-multifactorial in the setting of aspiration pneumonia, fluid overload/CHF exacerbation: 02/09/2023: Off of oxygen. Continue aspiration precautions.   Continue pulmonary hygiene. Dysphagia diet.   Enterobacter Colace bacteremia in the setting of UTI  right ureteral stone with mild hydronephrosis: Septic shock-resolved 1/30 was not a candidate for percutaneous  nephrostomy tube given elevated INR and thrombocytopenia.  S/p cystoscopy with right ureteral stent placement 1/30 Off of pressors now.  Fever improved. WBC improved. Cefepime changed to Meropenem due to encephalopathy. Continue antibiotics for total 14 days (antibiotics now complete). He has PICC line placed 02/05/23; removed prior to discharge  1 of 2 cultures from 1/29 with staph epidermidis, suspect contaminant   Indwelling Foley Catheter He's on flomax and finasteride - continue foley at discharge with outpt followup with urology    Left iliopsoas intramuscular bleed Right renal cyst with hemorrhage  LUE Ecchymoses Discussed renal cyst hemorrhage with urology, ok with resuming anticoagulation   Ileus  NGT removed 1/27, passing flatus Rectal tube is no longer in place   Hypernatremia: improved   AKI: Baseline creatinine 0.9.  Worsened after IV diuresis and shock.  Mild right hydronephrosis with right renal calculi.  Status post stent placement on 1/30 by urology.  Renal function improved.   Afib - on coumadin Valvular disease s/p bioprosthetic AVR 2012, w/ MV and TV repair  CAD, non-obstructive  HFpEF: Remain euvolemic.  Continue to hold on beta-blocker  and Coumadin.  Cardiology signed off on 02/06/2023.   Anemia of chronic disease Acute on chronic Thrombocytopenia Coagulapathy on coumadin  S/p 2 units platelets, 1 unit FFP, 4 units pRBC's  Transfuse additional unit pRBC today With repeat head CT without evidence of hemorrhagic conversion Cardiology notably has recommended heparin bridge for INR <2 Per oncology, anemia thought due to sepsis, bleeding, renal failure, and phlebotomy - Hb expected to improve over next several weeks Dr. Truett Perna -> planning for outpatient follow up (may have early MDS vs anemia related to chronic blood loss), he notes it'll take weeks for Hb to recover   Moderate protein calorie malnutrition:  Dietitian on board Encourage oral diet, supplements.   Hyperglycemia A1c 6.2 SSI  Interval History:  No events overnight. Patient and son have chosen [facility] for SNF. Insurance auth now pending.   Old records reviewed in assessment of this patient  Antimicrobials:   DVT prophylaxis:   warfarin (COUMADIN) tablet 7.5 mg   Code Status:   Code Status: Full Code  Mobility Assessment (Last 72 Hours)  Mobility Assessment     Row Name 02/21/23 1055 02/21/23 0830 02/20/23 2300 02/20/23 1115 02/20/23 1035   Does patient have an order for bedrest or is patient medically unstable -- No - Continue assessment No - Continue assessment -- No - Continue assessment   What is the highest level of mobility based on the progressive mobility assessment? Level 5 (Walks with assist in room/hall) - Balance while stepping forward/back and can walk in room with assist - Complete Level 5 (Walks with assist in room/hall) - Balance while stepping forward/back and can walk in room with assist - Complete Level 5 (Walks with assist in room/hall) - Balance while stepping forward/back and can walk in room with assist - Complete Level 5 (Walks with assist in room/hall) - Balance while stepping forward/back and can walk in room with assist  - Complete Level 5 (Walks with assist in room/hall) - Balance while stepping forward/back and can walk in room with assist - Complete   Is the above level different from baseline mobility prior to current illness? -- Yes - Recommend PT order Yes - Recommend PT order -- Yes - Recommend PT order    Row Name 02/20/23 0320 02/19/23 2000 02/19/23 1157 02/19/23 0900 02/18/23 2020   Does patient have an order for bedrest or is patient medically unstable No - Continue assessment No - Continue assessment -- No - Continue assessment No - Continue assessment   What is the highest level of mobility based on the progressive mobility assessment? Level 5 (Walks with assist in room/hall) - Balance while stepping forward/back and can walk in room with assist - Complete Level 5 (Walks with assist in room/hall) - Balance while stepping forward/back and can walk in room with assist - Complete Level 5 (Walks with assist in room/hall) - Balance while stepping forward/back and can walk in room with assist - Complete Level 5 (Walks with assist in room/hall) - Balance while stepping forward/back and can walk in room with assist - Complete Level 5 (Walks with assist in room/hall) - Balance while stepping forward/back and can walk in room with assist - Complete   Is the above level different from baseline mobility prior to current illness? Yes - Recommend PT order Yes - Recommend PT order -- Yes - Recommend PT order Yes - Recommend PT order            Barriers to discharge: none Disposition Plan:  SNF Status is: Inpt  Objective: Blood pressure 138/80, pulse (!) 53, temperature 98.2 F (36.8 C), temperature source Oral, resp. rate 20, height 6' (1.829 m), weight 74.9 kg, SpO2 98%.  Examination:  Physical Exam Constitutional:      General: He is not in acute distress.    Appearance: Normal appearance.     Comments: Pleasant and elderly; NAD  HENT:     Head: Normocephalic and atraumatic.     Mouth/Throat:     Mouth:  Mucous membranes are moist.  Eyes:     Extraocular Movements: Extraocular movements intact.  Cardiovascular:     Rate and Rhythm: Normal rate and regular rhythm.  Pulmonary:     Effort: Pulmonary effort is normal. No respiratory distress.     Breath sounds: Normal breath sounds. No wheezing.  Abdominal:     General: Bowel sounds are normal. There is no distension.     Palpations: Abdomen is soft.     Tenderness: There is no abdominal tenderness.  Genitourinary:    Comments: Foley in place with  dark yellow urine in bag Musculoskeletal:        General: Normal range of motion.     Cervical back: Normal range of motion and neck supple.  Skin:    General: Skin is warm and dry.     Findings: Bruising (scattered, worse in LUE) present.  Neurological:     General: No focal deficit present.     Mental Status: He is alert.  Psychiatric:        Mood and Affect: Mood normal.        Behavior: Behavior normal.      Consultants:    Procedures:    Data Reviewed: Results for orders placed or performed during the hospital encounter of 02/02/23 (from the past 24 hours)  CBC     Status: Abnormal   Collection Time: 02/21/23  4:18 AM  Result Value Ref Range   WBC 5.8 4.0 - 10.5 K/uL   RBC 2.95 (L) 4.22 - 5.81 MIL/uL   Hemoglobin 8.5 (L) 13.0 - 17.0 g/dL   HCT 28.4 (L) 13.2 - 44.0 %   MCV 96.9 80.0 - 100.0 fL   MCH 28.8 26.0 - 34.0 pg   MCHC 29.7 (L) 30.0 - 36.0 g/dL   RDW 10.2 (H) 72.5 - 36.6 %   Platelets 241 150 - 400 K/uL   nRBC 0.0 0.0 - 0.2 %  Protime-INR     Status: Abnormal   Collection Time: 02/21/23  7:43 AM  Result Value Ref Range   Prothrombin Time 22.3 (H) 11.4 - 15.2 seconds   INR 1.9 (H) 0.8 - 1.2  Blood transfusion report - scanned     Status: None ()   Collection Time: 02/21/23 10:14 AM   Narrative   Ordered by an unspecified provider.  Blood transfusion report - scanned     Status: None   Collection Time: 02/21/23 10:14 AM   Narrative   Ordered by an  unspecified provider.    I have reviewed pertinent nursing notes, vitals, labs, and images as necessary. I have ordered labwork to follow up on as indicated.  I have reviewed the last notes from staff over past 24 hours. I have discussed patient's care plan and test results with nursing staff, CM/SW, and other staff as appropriate.     LOS: 18 days   Lewie Chamber, MD [facility] 02/21/2023, 1:56 PM

## 2023-02-21 NOTE — Progress Notes (Signed)
PHARMACY - ANTICOAGULATION CONSULT NOTE  Pharmacy Consult: Heparin + warfarin >> warfarin monotherapy Indication: bioprosthetic aortic valve, mitral valve repair, and Afib. -Acute ischemic stroke  Allergies  Allergen Reactions   Sulfa Antibiotics Other (See Comments)    Granulocytosis   Sulfamethoxazole-Trimethoprim     Other Reaction(s): Unknown   Zestril [Lisinopril] Cough    Patient Measurements: Height: 6' (182.9 cm) Weight: 74.9 kg (165 lb 2 oz) IBW/kg (Calculated) : 77.6 Heparin Dosing Weight: 77.9 kg  Vital Signs: Temp: 98.2 F (36.8 C) (02/13 0427) Temp Source: Oral (02/13 0427) BP: 138/77 (02/13 0427) Pulse Rate: 84 (02/13 0427)  Labs: Recent Labs    02/19/23 0335 02/20/23 0410 02/21/23 0418 02/21/23 0743  HGB 7.7* 7.6* 8.5*  --   HCT 24.8* 25.1* 28.6*  --   PLT 223 213 241  --   LABPROT 22.6* 22.2*  --  22.3*  INR 2.0* 1.9*  --  1.9*  HEPARINUNFRC 0.46  --   --   --   CREATININE 0.71 0.82  --   --     Estimated Creatinine Clearance: 69.8 mL/min (by C-G formula based on SCr of 0.82 mg/dL).   Assessment:  AC/Heme: On warfarin 5mg  daily except 7.5 mg on Mon PTA for bioprosthetic aortic valve, mitral valve repair, and Afib. -Acute ischemic stroke, MRI 2/1 w/ small left frontal and temporal white matter infarct  -Left iliopsoas IM bleed, renal cyst w/ hemorrhage, LUE hematoma - Cardiology recommended heparin bridge with warfarin while INR <2 (see 1/29 cardiology note) -Heparin resumed 2/5 - 2/7: Hgb 6.9, transfused. Resume warfarin - 2/11: Off heparin drip. Hgb/HCT stable at 7.6, no overt bleeding noted in chart. Plts stable.  2/12: No overt bleeding noted from RN. Occasional blood noted in foley bag but has been similar over past few days. Hgb/Hct stable.  2/13: INR stable at 1.9. H/H stable, no overt bleeding noted.   Goal of Therapy:  INR goal 2.0-3.0 Monitor platelets by anticoagulation protocol: Yes   Today, 2/13 Plan:  - Coumadin 7.5 mg po x  1 today - Daily CBC and INR

## 2023-02-21 NOTE — Progress Notes (Signed)
Physical Therapy Treatment Patient Details Name: [name]. MRN: [id number] DOB: [birth year] [name] Date: 02/21/2023   History of Present Illness Patient is a 88 year old male who presented on 1/25 with dyspnea and hypoxia. Patient was admited with concerns of SBO. 1/27 patient pulled out NG tube with increased work of breath and fever. 1/29 patient on HFNC with CT revealing mild right hydronephrosis with 12 mm right proximal ureteral stone.1/31 patient was taken off vasopressors and received PRBCs. 2/1 patient found to have left side weakness with code stroke called. MRI revealed L MCA. PMH: R TKR, L THR, L TKA,h/o stroke, peripheral neuropathy, gout, HTN.    PT Comments  Pt assisted with ambulating in hallway and fatigues quickly.  Pt requiring min assist for mobility at this time.  Pt presents as high fall risk.  Pt reports he lives with spouse and she is not able to physically assist.  Patient will benefit from continued inpatient follow up therapy, <3 hours/day.     If plan is discharge home, recommend the following: A little help with walking and/or transfers;A little help with bathing/dressing/bathroom;Assistance with cooking/housework;Assist for transportation;Help with stairs or ramp for entrance   Can travel by private vehicle     Yes  Equipment Recommendations  None recommended by PT    Recommendations for Other Services       Precautions / Restrictions Precautions Precautions: Fall     Mobility  Bed Mobility Overal bed mobility: Needs Assistance Bed Mobility: Supine to Sit     Supine to sit: HOB elevated, Contact guard, Used rails          Transfers Overall transfer level: Needs assistance Equipment used: Rolling walker (2 wheels) Transfers: Sit to/from Stand Sit to Stand: Min assist           General transfer comment: verbal cues for hand placement, assist required to rise and steady, also cues for not sitting while turning towards recliner for  safety, min assist to control descent    Ambulation/Gait Ambulation/Gait assistance: Min assist, Contact guard assist Gait Distance (Feet): 60 Feet (x2) Assistive device: Rolling walker (2 wheels) Gait Pattern/deviations: Shuffle, Trunk flexed, Decreased dorsiflexion - left, Decreased stride length, Step-through pattern Gait velocity: decr     General Gait Details: verbal cues for posture and RW positioning, standing rest break after 60 ft, Spo2 93% on room air at rest break and 97% room air upon return to room  (HR 55-71 in afib per telemetry monitor)   Stairs             Wheelchair Mobility     Tilt Bed    Modified Rankin (Stroke Patients Only)       Balance                                            Communication Communication Factors Affecting Communication: Hearing impaired  Cognition Arousal: Alert Behavior During Therapy: WFL for tasks assessed/performed   PT - Cognitive impairments: No apparent impairments                         Following commands: Intact      Cueing    Exercises General Exercises - Lower Extremity Ankle Circles/Pumps: AROM, Both, 15 reps, Seated Long Arc Quad: AROM, Both, 10 reps, Seated Hip Flexion/Marching: AROM, Seated, Both, 10 reps  General Comments        Pertinent Vitals/Pain Pain Assessment Pain Assessment: No/denies pain    Home Living                          Prior Function            PT Goals (current goals can now be found in the care plan section) Progress towards PT goals: Progressing toward goals    Frequency    Min 1X/week      PT Plan      Co-evaluation              AM-PAC PT "6 Clicks" Mobility   Outcome Measure  Help needed turning from your back to your side while in a flat bed without using bedrails?: A Little Help needed moving from lying on your back to sitting on the side of a flat bed without using bedrails?: A Little Help needed  moving to and from a bed to a chair (including a wheelchair)?: A Little Help needed standing up from a chair using your arms (e.g., wheelchair or bedside chair)?: A Little Help needed to walk in hospital room?: A Little Help needed climbing 3-5 steps with a railing? : A Lot 6 Click Score: 17    End of Session Equipment Utilized During Treatment: Gait belt Activity Tolerance: Patient limited by fatigue Patient left: in bed;with call bell/phone within reach;with bed alarm set   PT Visit Diagnosis: Muscle weakness (generalized) (M62.81);Difficulty in walking, not elsewhere classified (R26.2)     Time: 1610-9604 PT Time Calculation (min) (ACUTE ONLY): 11 min  Charges:    $Gait Training: 8-22 mins  Paulino Door, DPT Physical Therapist Acute Rehabilitation Services Office: [phone]    Janan Halter [place] 02/21/2023, 10:56 AM

## 2023-02-22 ENCOUNTER — Other Ambulatory Visit: Payer: Self-pay | Admitting: *Deleted

## 2023-02-22 DIAGNOSIS — D649 Anemia, unspecified: Secondary | ICD-10-CM

## 2023-02-22 DIAGNOSIS — Z7401 Bed confinement status: Secondary | ICD-10-CM | POA: Diagnosis not present

## 2023-02-22 DIAGNOSIS — J9601 Acute respiratory failure with hypoxia: Secondary | ICD-10-CM | POA: Diagnosis not present

## 2023-02-22 DIAGNOSIS — R531 Weakness: Secondary | ICD-10-CM | POA: Diagnosis not present

## 2023-02-22 DIAGNOSIS — K5900 Constipation, unspecified: Secondary | ICD-10-CM | POA: Diagnosis not present

## 2023-02-22 DIAGNOSIS — I4821 Permanent atrial fibrillation: Secondary | ICD-10-CM | POA: Diagnosis not present

## 2023-02-22 DIAGNOSIS — I639 Cerebral infarction, unspecified: Secondary | ICD-10-CM | POA: Diagnosis not present

## 2023-02-22 DIAGNOSIS — R2681 Unsteadiness on feet: Secondary | ICD-10-CM | POA: Diagnosis not present

## 2023-02-22 DIAGNOSIS — R739 Hyperglycemia, unspecified: Secondary | ICD-10-CM | POA: Diagnosis not present

## 2023-02-22 DIAGNOSIS — I959 Hypotension, unspecified: Secondary | ICD-10-CM | POA: Diagnosis not present

## 2023-02-22 DIAGNOSIS — E785 Hyperlipidemia, unspecified: Secondary | ICD-10-CM | POA: Diagnosis not present

## 2023-02-22 DIAGNOSIS — N201 Calculus of ureter: Secondary | ICD-10-CM | POA: Diagnosis not present

## 2023-02-22 DIAGNOSIS — I10 Essential (primary) hypertension: Secondary | ICD-10-CM | POA: Diagnosis not present

## 2023-02-22 DIAGNOSIS — A4159 Other Gram-negative sepsis: Secondary | ICD-10-CM | POA: Diagnosis not present

## 2023-02-22 DIAGNOSIS — M1A9XX1 Chronic gout, unspecified, with tophus (tophi): Secondary | ICD-10-CM | POA: Diagnosis not present

## 2023-02-22 DIAGNOSIS — M6281 Muscle weakness (generalized): Secondary | ICD-10-CM | POA: Diagnosis not present

## 2023-02-22 DIAGNOSIS — Z743 Need for continuous supervision: Secondary | ICD-10-CM | POA: Diagnosis not present

## 2023-02-22 LAB — CBC
HCT: 26.3 % — ABNORMAL LOW (ref 39.0–52.0)
Hemoglobin: 7.9 g/dL — ABNORMAL LOW (ref 13.0–17.0)
MCH: 28.6 pg (ref 26.0–34.0)
MCHC: 30 g/dL (ref 30.0–36.0)
MCV: 95.3 fL (ref 80.0–100.0)
Platelets: 226 10*3/uL (ref 150–400)
RBC: 2.76 MIL/uL — ABNORMAL LOW (ref 4.22–5.81)
RDW: 17.8 % — ABNORMAL HIGH (ref 11.5–15.5)
WBC: 5.5 10*3/uL (ref 4.0–10.5)
nRBC: 0 % (ref 0.0–0.2)

## 2023-02-22 LAB — PROTIME-INR
INR: 1.8 — ABNORMAL HIGH (ref 0.8–1.2)
Prothrombin Time: 21 s — ABNORMAL HIGH (ref 11.4–15.2)

## 2023-02-22 MED ORDER — WARFARIN SODIUM 3 MG PO TABS
9.0000 mg | ORAL_TABLET | Freq: Once | ORAL | Status: DC
Start: 2023-02-22 — End: 2023-02-22
  Filled 2023-02-22: qty 3

## 2023-02-22 NOTE — Progress Notes (Signed)
PHARMACY - ANTICOAGULATION CONSULT NOTE  Pharmacy Consult: Heparin + warfarin >> warfarin monotherapy Indication: bioprosthetic aortic valve, mitral valve repair, and Afib. -Acute ischemic stroke  Allergies  Allergen Reactions   Sulfa Antibiotics Other (See Comments)    Granulocytosis   Sulfamethoxazole-Trimethoprim     Other Reaction(s): Unknown   Zestril [Lisinopril] Cough    Patient Measurements: Height: 6' (182.9 cm) Weight: 74.7 kg (164 lb 10.9 oz) IBW/kg (Calculated) : 77.6 Heparin Dosing Weight: 77.9 kg  Vital Signs: Temp: 98 F (36.7 C) (02/14 0555) Temp Source: Oral (02/14 0555) BP: 120/62 (02/14 0555) Pulse Rate: 77 (02/14 0555)  Labs: Recent Labs    02/20/23 0410 02/21/23 0418 02/21/23 0743 02/22/23 0410 02/22/23 0901  HGB 7.6* 8.5*  --  7.9*  --   HCT 25.1* 28.6*  --  26.3*  --   PLT 213 241  --  226  --   LABPROT 22.2*  --  22.3*  --  21.0*  INR 1.9*  --  1.9*  --  1.8*  CREATININE 0.82  --   --   --   --     Estimated Creatinine Clearance: 69.6 mL/min (by C-G formula based on SCr of 0.82 mg/dL).   Assessment:  AC/Heme: On warfarin 5mg  daily except 7.5 mg on Mon PTA for bioprosthetic aortic valve, mitral valve repair, and Afib. -Acute ischemic stroke, MRI 2/1 w/ small left frontal and temporal white matter infarct  -Left iliopsoas IM bleed, renal cyst w/ hemorrhage, LUE hematoma - Cardiology recommended heparin bridge with warfarin while INR <2 (see 1/29 cardiology note) -Heparin resumed 2/5 - 2/7: Hgb 6.9, transfused. Resume warfarin - 2/11: Off heparin drip. Hgb/HCT stable at 7.6, no overt bleeding noted in chart. Plts stable.  2/12: No overt bleeding noted from RN. Occasional blood noted in foley bag but has been similar over past few days. Hgb/Hct stable.  2/14: Hgb now down to 1.8 after receiving warfarin 7.5 mg last two days. Hgb 8.5 >> 7.9 from yesterday. Plts 241 >> 226 from yesterday, overall stable. No s/sx of bleeding, per RN. Will plan  to cautiously increase warfarin dose today given down-trending INR.   Goal of Therapy:  INR goal 2.0-3.0 Monitor platelets by anticoagulation protocol: Yes   Today, 2/13 Plan:  - Coumadin 9mg  po x 1 today - Daily CBC and INR   Cherylin Mylar, PharmD Clinical Pharmacist  2/14/20259:34 AM

## 2023-02-22 NOTE — Plan of Care (Signed)
Problem: Education: Goal: Ability to demonstrate management of disease process will improve Outcome: Adequate for Discharge Goal: Ability to verbalize understanding of medication therapies will improve Outcome: Adequate for Discharge   Problem: Activity: Goal: Capacity to carry out activities will improve Outcome: Adequate for Discharge   Problem: Cardiac: Goal: Ability to achieve and maintain adequate cardiopulmonary perfusion will improve Outcome: Adequate for Discharge   Problem: Education: Goal: Knowledge of General Education information will improve Description: Including pain rating scale, medication(s)/side effects and non-pharmacologic comfort measures Outcome: Adequate for Discharge

## 2023-02-22 NOTE — Progress Notes (Signed)
Mobility Specialist - Progress Note   02/22/23 0804  Mobility  Activity Transferred from bed to chair  Level of Assistance Contact guard assist, steadying assist  Assistive Device Front wheel walker  Distance Ambulated (ft) 3 ft  Range of Motion/Exercises Active  Activity Response Tolerated well  Mobility Referral Yes  Mobility visit 1 Mobility  Mobility Specialist Start Time (ACUTE ONLY) 0755  Mobility Specialist Stop Time (ACUTE ONLY) 0804  Mobility Specialist Time Calculation (min) (ACUTE ONLY) 9 min   Pt requesting to transfer to recliner chair for breakfast. Assisted with transfer and was left on recliner chair with all needs met. Call bell in reach and chair alarm on.  Billey Chang Mobility Specialist

## 2023-02-22 NOTE — Progress Notes (Signed)
Per Dr. Truett Perna: Being d/c from hospital soon. Needs lab/OV in 2-3 weeks. Scheduling message sent.

## 2023-02-22 NOTE — TOC Transition Note (Signed)
Transition of Care [facility]) - Discharge Note   Patient Details  Name: [name]. MRN: [id number] Date of Birth: 1935-10-24  Transition of Care [facility]) CM/SW Contact:  Larrie Kass, LCSW Phone Number: 02/22/2023, 9:10 AM   Clinical Narrative:     Pt to d/c to [facility], pt's room assignment is 404A RN to call report to [phone]. CSW attempted to contact pt's son , no answer left VM informing him about transfer. PTAR called no further TOC needs TOC sign off.   Final next level of care: Skilled Nursing Facility Barriers to Discharge: Barriers Resolved   Patient Goals and CMS Choice Patient states their goals for this hospitalization and ongoing recovery are:: Snf to get stonger CMS Medicare.gov Compare Post Acute Care list provided to:: Patient Choice offered to / list presented to : Patient, Adult Children      Discharge Placement              Patient chooses bed at: [facility] Patient to be transferred to facility by: EMs Name of family member notified: Macauley,alexander (Son)  [phone] [facility]) Patient and family notified of of transfer: 02/22/23  Discharge Plan and Services Additional resources added to the After Visit Summary for   In-house Referral: Clinical Social Work                                   Social Drivers of Health (SDOH) Interventions SDOH Screenings   Food Insecurity: No Food Insecurity (02/03/2023)  Housing: Low Risk  (02/03/2023)  Transportation Needs: No Transportation Needs (02/03/2023)  Utilities: Not At Risk (02/03/2023)  Alcohol Screen: Low Risk  (06/02/2021)  Depression (PHQ2-9): Low Risk  (06/16/2021)  Financial Resource Strain: Low Risk  (06/16/2021)  Social Connections: Unknown (02/03/2023)  Stress: No Stress Concern Present (06/16/2021)  Tobacco Use: Low Risk  (02/07/2023)     Readmission Risk Interventions    02/11/2023    4:00 PM 11/10/2021    3:54 PM  Readmission Risk Prevention Plan  Transportation  Screening Complete Complete  PCP or Specialist Appt within 3-5 Days Not Complete Complete  Not Complete comments Patient will discharge to SNF.   HRI or Home Care Consult Complete Complete  Social Work Consult for Recovery Care Planning/Counseling Complete Complete  Palliative Care Screening Not Applicable Not Applicable  Medication Review Oceanographer) Complete Complete

## 2023-02-26 DIAGNOSIS — K5900 Constipation, unspecified: Secondary | ICD-10-CM | POA: Diagnosis not present

## 2023-02-26 DIAGNOSIS — D649 Anemia, unspecified: Secondary | ICD-10-CM | POA: Diagnosis not present

## 2023-02-27 NOTE — Consult Note (Signed)
[facility]Based [facility] Liaison Consult Note   02/27/2023  [name]. [birth year] [id number]  [facility] Institute [VBCI] Consult:  Post hospital change in disposition   Primary Care Provider:  Jarrett Soho, PA-C with Deboraha Sprang at [employer]  Insurance: [employer]  Patient was reviewed for disposition  Plan:  Patient transitioned Secretary/administrator for ST rehab SNF and will notify the [facility] RN can follow for any known or needs for transitional care needs for returning to post facility care coordination needs to return to community.  Also, notified [facility] team of disposition on. 02/22/23  For questions or referrals, please contact:  Charlesetta Shanks, RN, BSN, CCM [place]  [facility] Liaison Direct Dial: [phone] or secure chat Email: [email or web address]

## 2023-03-01 ENCOUNTER — Encounter (HOSPITAL_COMMUNITY): Payer: Self-pay | Admitting: Urology

## 2023-03-01 NOTE — Progress Notes (Signed)
For Anesthesia: PCP - Jarrett Soho, PA-C  Cardiologist - Donato Schultz, MD  Neurologist- Levert Feinstein, MD   Chest x-ray - 02/10/23 in [facility] EKG - 02/03/23 in [facility] Stress Test - greater than 2 years in [facility] ECHO - 02/03/23 in [facility] Cardiac Cath - greater than 2 years in [facility] Pacemaker/ICD device last checked: N/A Pacemaker orders received: N/A Device Rep notified: N/A  Spinal Cord Stimulator: N/A  Sleep Study -  CPAP -   Fasting Blood Sugar -  Checks Blood Sugar _____ times a day Date and result of last Hgb A1c-  Last dose of GLP1 agonist-  GLP1 instructions:   Last dose of SGLT-2 inhibitors-  SGLT-2 instructions:  Blood Thinner Instructions: Aspirin Instructions: Last Dose:  Activity level: Can go up a flight of stairs and activities of daily living without stopping and without chest pain and/or shortness of breath   Able to exercise without chest pain and/or shortness of breath   Unable to go up a flight of stairs without chest pain and/or shortness of breath     Anesthesia review: recent hospitalization 01/2023, CAD, Heart murmur, HTN, Persistent atrial fibrillation, S/P aortic valve replacement with prosthetic valve, S/P Maze operation, S/P MVR, S/P PFO closure, S/P tricuspid valve repair, Stroke, severe pulmonary hypertension     Patient denies shortness of breath, fever, cough and chest pain during pre op phone call.   Patient verbalized understanding of instructions reviewed via telephone.

## 2023-03-03 ENCOUNTER — Other Ambulatory Visit: Payer: Self-pay | Admitting: Cardiology

## 2023-03-03 DIAGNOSIS — I4891 Unspecified atrial fibrillation: Secondary | ICD-10-CM

## 2023-03-04 ENCOUNTER — Ambulatory Visit: Payer: Medicare Other | Admitting: Physician Assistant

## 2023-03-04 DIAGNOSIS — D649 Anemia, unspecified: Secondary | ICD-10-CM | POA: Diagnosis not present

## 2023-03-04 DIAGNOSIS — I639 Cerebral infarction, unspecified: Secondary | ICD-10-CM | POA: Diagnosis not present

## 2023-03-04 DIAGNOSIS — M1A9XX1 Chronic gout, unspecified, with tophus (tophi): Secondary | ICD-10-CM | POA: Diagnosis not present

## 2023-03-04 DIAGNOSIS — I4821 Permanent atrial fibrillation: Secondary | ICD-10-CM | POA: Diagnosis not present

## 2023-03-07 NOTE — Progress Notes (Signed)
 Interview completed over the phone. Emailed instructions to patient. Advised to use CHG soap, if not able to get advised to use Dial.   COVID Vaccine Completed: yes  Date of COVID positive in last 90 days: no  PCP - Jarrett Soho, MD Cardiologist - Tereso Newcomer, PA Neurologist- Levert Feinstein, MD  Chest x-ray - 02/10/23 Epic EKG - 02/03/23 Epic Stress Test - 04/12/16 Epic ECHO - 02/03/23 Epic Cardiac Cath - 10/14/17 Epic Pacemaker/ICD device last checked: n/a Spinal Cord Stimulator: n/a  Bowel Prep - no  Sleep Study - n/a CPAP -   Fasting Blood Sugar - n/a Checks Blood Sugar _____ times a day  Last dose of GLP1 agonist-  N/A GLP1 instructions:  Hold 7 days before surgery    Last dose of SGLT-2 inhibitors-  N/A SGLT-2 instructions:  Hold 3 days before surgery    Blood Thinner Instructions: Warfarin, hold 4 days.  Aspirin Instructions: Last Dose: 0800  Activity level: Can go up a flight of stairs and perform activities of daily living without stopping and without symptoms of chest pain or shortness of breath.   Anesthesia review: CAD, a fib, CAD, dysphagia, MVR, AVR, anemia, CVA, pulmonary HTN, stroke, Hgb 7.9  Patient denies shortness of breath, fever, cough and chest pain at PAT appointment  Patient verbalized understanding of instructions that were given to them at the PAT appointment. Patient was also instructed that they will need to review over the PAT instructions again at home before surgery.

## 2023-03-08 ENCOUNTER — Encounter (HOSPITAL_COMMUNITY)
Admission: RE | Admit: 2023-03-08 | Discharge: 2023-03-08 | Disposition: A | Discharge status: Home / Self Care | Payer: Medicare Other | Source: Ambulatory Visit | Attending: Urology | Admitting: Urology

## 2023-03-08 ENCOUNTER — Encounter (HOSPITAL_COMMUNITY): Payer: Self-pay

## 2023-03-08 ENCOUNTER — Other Ambulatory Visit: Payer: Self-pay

## 2023-03-08 NOTE — Patient Instructions (Addendum)
 SURGICAL WAITING ROOM VISITATION  Patients having surgery or a procedure may have no more than 2 support people in the waiting area - these visitors may rotate.    Children under the age of 4 must have an adult with them who is not the patient.  Due to an increase in RSV and influenza rates and associated hospitalizations, children ages 52 and under may not visit patients in [facility] hospitals.  Visitors with respiratory illnesses are discouraged from visiting and should remain at home.  If the patient needs to stay at the hospital during part of their recovery, the visitor guidelines for inpatient rooms apply. Pre-op nurse will coordinate an appropriate time for 1 support person to accompany patient in pre-op.  This support person may not rotate.    Please refer to the [facility] website for the visitor guidelines for Inpatients (after your surgery is over and you are in a regular room).    Your procedure is scheduled on: 03/12/23   Report to [facility] Main Entrance    Report to admitting at 5:15 AM   Call this number if you have problems the morning of surgery [phone]   Do not eat food or drink liquids :After Midnight.          If you have questions, please contact your surgeon's office.   FOLLOW BOWEL PREP AND ANY ADDITIONAL PRE OP INSTRUCTIONS YOU RECEIVED FROM YOUR SURGEON'S OFFICE!!!     Oral Hygiene is also important to reduce your risk of infection.                                    Remember - BRUSH YOUR TEETH THE MORNING OF SURGERY WITH YOUR REGULAR TOOTHPASTE  DENTURES WILL BE REMOVED PRIOR TO SURGERY PLEASE DO NOT APPLY "Poly grip" OR ADHESIVES!!!   Stop all vitamins and herbal supplements 7 days before surgery.   Take these medicines the morning of surgery with A SIP OF WATER: Allopurinol, Amlodipine, Atenolol, Rosuvastatin                               You may not have any metal on your body including jewelry, and body piercing             Do  not wear lotions, powders, cologne, or deodorant              Men may shave face and neck.   Do not bring valuables to the hospital. [facility] IS NOT             RESPONSIBLE   FOR VALUABLES.   Contacts, glasses, dentures or bridgework may not be worn into surgery.  DO NOT BRING YOUR HOME MEDICATIONS TO THE HOSPITAL. PHARMACY WILL DISPENSE MEDICATIONS LISTED ON YOUR MEDICATION LIST TO YOU DURING YOUR ADMISSION IN THE HOSPITAL!    Patients discharged on the day of surgery will not be allowed to drive home.  Someone NEEDS to stay with you for the first 24 hours after anesthesia.   Special Instructions: Bring a copy of your healthcare power of attorney and living will documents the day of surgery if you haven't scanned them before.              Please read over the following fact sheets you were given: IF YOU HAVE QUESTIONS ABOUT YOUR PRE-OP INSTRUCTIONS PLEASE  CALL [name]    If you received a COVID test during your pre-op visit  it is requested that you wear a mask when out in public, stay away from anyone that may not be feeling well and notify your surgeon if you develop symptoms. If you test positive for Covid or have been in contact with anyone that has tested positive in the last 10 days please notify you surgeon.    [place] - Preparing for Surgery Before surgery, you can play an important role.  Because skin is not sterile, your skin needs to be as free of germs as possible.  You can reduce the number of germs on your skin by washing with CHG (chlorahexidine gluconate) soap before surgery.  CHG is an antiseptic cleaner which kills germs and bonds with the skin to continue killing germs even after washing. Please DO NOT use if you have an allergy to CHG or antibacterial soaps.  If your skin becomes reddened/irritated stop using the CHG and inform your nurse when you arrive at Short Stay. Do not shave (including legs and underarms) for at least 48 hours prior to the first  CHG shower.  You may shave your face/neck.  Please follow these instructions carefully:  1.  Shower with CHG Soap the night before surgery and the  morning of surgery.  2.  If you choose to wash your hair, wash your hair first as usual with your normal  shampoo.  3.  After you shampoo, rinse your hair and body thoroughly to remove the shampoo.                             4.  Use CHG as you would any other liquid soap.  You can apply chg directly to the skin and wash.  Gently with a scrungie or clean washcloth.  5.  Apply the CHG Soap to your body ONLY FROM THE NECK DOWN.   Do   not use on face/ open                           Wound or open sores. Avoid contact with eyes, ears mouth and   genitals (private parts).                       Wash face,  Genitals (private parts) with your normal soap.             6.  Wash thoroughly, paying special attention to the area where your    surgery  will be performed.  7.  Thoroughly rinse your body with warm water from the neck down.  8.  DO NOT shower/wash with your normal soap after using and rinsing off the CHG Soap.                9.  Pat yourself dry with a clean towel.            10.  Wear clean pajamas.            11.  Place clean sheets on your bed the night of your first shower and do not  sleep with pets. Day of Surgery : Do not apply any lotions/deodorants the morning of surgery.  Please wear clean clothes to the hospital/surgery center.  FAILURE TO FOLLOW THESE INSTRUCTIONS MAY RESULT IN THE CANCELLATION OF YOUR SURGERY  PATIENT SIGNATURE_________________________________  NURSE SIGNATURE__________________________________  ________________________________________________________________________

## 2023-03-09 ENCOUNTER — Emergency Department (HOSPITAL_COMMUNITY)

## 2023-03-09 ENCOUNTER — Other Ambulatory Visit: Payer: Self-pay

## 2023-03-09 ENCOUNTER — Encounter (HOSPITAL_COMMUNITY): Payer: Self-pay

## 2023-03-09 ENCOUNTER — Inpatient Hospital Stay (HOSPITAL_COMMUNITY)
Admission: EM | Admit: 2023-03-09 | Discharge: 2023-03-14 | DRG: 659 | Disposition: A | Discharge status: Home Health | Source: Skilled Nursing Facility | Attending: Family Medicine | Admitting: Family Medicine

## 2023-03-09 DIAGNOSIS — Z9842 Cataract extraction status, left eye: Secondary | ICD-10-CM

## 2023-03-09 DIAGNOSIS — I2510 Atherosclerotic heart disease of native coronary artery without angina pectoris: Secondary | ICD-10-CM | POA: Diagnosis not present

## 2023-03-09 DIAGNOSIS — Z1152 Encounter for screening for COVID-19: Secondary | ICD-10-CM | POA: Diagnosis not present

## 2023-03-09 DIAGNOSIS — I4821 Permanent atrial fibrillation: Secondary | ICD-10-CM | POA: Diagnosis not present

## 2023-03-09 DIAGNOSIS — N281 Cyst of kidney, acquired: Secondary | ICD-10-CM | POA: Diagnosis not present

## 2023-03-09 DIAGNOSIS — Z882 Allergy status to sulfonamides status: Secondary | ICD-10-CM | POA: Diagnosis not present

## 2023-03-09 DIAGNOSIS — Z8673 Personal history of transient ischemic attack (TIA), and cerebral infarction without residual deficits: Secondary | ICD-10-CM | POA: Diagnosis not present

## 2023-03-09 DIAGNOSIS — I2729 Other secondary pulmonary hypertension: Secondary | ICD-10-CM | POA: Diagnosis present

## 2023-03-09 DIAGNOSIS — Z953 Presence of xenogenic heart valve: Secondary | ICD-10-CM

## 2023-03-09 DIAGNOSIS — I517 Cardiomegaly: Secondary | ICD-10-CM | POA: Diagnosis not present

## 2023-03-09 DIAGNOSIS — T83511A Infection and inflammatory reaction due to indwelling urethral catheter, initial encounter: Secondary | ICD-10-CM | POA: Diagnosis not present

## 2023-03-09 DIAGNOSIS — I4891 Unspecified atrial fibrillation: Secondary | ICD-10-CM

## 2023-03-09 DIAGNOSIS — K5730 Diverticulosis of large intestine without perforation or abscess without bleeding: Secondary | ICD-10-CM | POA: Diagnosis present

## 2023-03-09 DIAGNOSIS — T83021A Displacement of indwelling urethral catheter, initial encounter: Secondary | ICD-10-CM | POA: Diagnosis not present

## 2023-03-09 DIAGNOSIS — R7881 Bacteremia: Secondary | ICD-10-CM | POA: Diagnosis not present

## 2023-03-09 DIAGNOSIS — Z66 Do not resuscitate: Secondary | ICD-10-CM | POA: Diagnosis not present

## 2023-03-09 DIAGNOSIS — I110 Hypertensive heart disease with heart failure: Secondary | ICD-10-CM | POA: Diagnosis not present

## 2023-03-09 DIAGNOSIS — Z860101 Personal history of adenomatous and serrated colon polyps: Secondary | ICD-10-CM

## 2023-03-09 DIAGNOSIS — N202 Calculus of kidney with calculus of ureter: Secondary | ICD-10-CM | POA: Diagnosis present

## 2023-03-09 DIAGNOSIS — Y846 Urinary catheterization as the cause of abnormal reaction of the patient, or of later complication, without mention of misadventure at the time of the procedure: Secondary | ICD-10-CM | POA: Diagnosis present

## 2023-03-09 DIAGNOSIS — Z471 Aftercare following joint replacement surgery: Secondary | ICD-10-CM | POA: Diagnosis not present

## 2023-03-09 DIAGNOSIS — R0689 Other abnormalities of breathing: Secondary | ICD-10-CM | POA: Diagnosis not present

## 2023-03-09 DIAGNOSIS — N401 Enlarged prostate with lower urinary tract symptoms: Secondary | ICD-10-CM | POA: Diagnosis present

## 2023-03-09 DIAGNOSIS — R001 Bradycardia, unspecified: Secondary | ICD-10-CM | POA: Diagnosis present

## 2023-03-09 DIAGNOSIS — Z7901 Long term (current) use of anticoagulants: Secondary | ICD-10-CM | POA: Diagnosis not present

## 2023-03-09 DIAGNOSIS — Z96642 Presence of left artificial hip joint: Secondary | ICD-10-CM | POA: Diagnosis present

## 2023-03-09 DIAGNOSIS — I5032 Chronic diastolic (congestive) heart failure: Secondary | ICD-10-CM | POA: Diagnosis not present

## 2023-03-09 DIAGNOSIS — Z96653 Presence of artificial knee joint, bilateral: Secondary | ICD-10-CM | POA: Diagnosis not present

## 2023-03-09 DIAGNOSIS — Z808 Family history of malignant neoplasm of other organs or systems: Secondary | ICD-10-CM

## 2023-03-09 DIAGNOSIS — Z8249 Family history of ischemic heart disease and other diseases of the circulatory system: Secondary | ICD-10-CM

## 2023-03-09 DIAGNOSIS — N390 Urinary tract infection, site not specified: Secondary | ICD-10-CM | POA: Diagnosis present

## 2023-03-09 DIAGNOSIS — I700 Atherosclerosis of aorta: Secondary | ICD-10-CM | POA: Diagnosis not present

## 2023-03-09 DIAGNOSIS — N136 Pyonephrosis: Secondary | ICD-10-CM | POA: Diagnosis present

## 2023-03-09 DIAGNOSIS — N289 Disorder of kidney and ureter, unspecified: Secondary | ICD-10-CM | POA: Diagnosis not present

## 2023-03-09 DIAGNOSIS — A4151 Sepsis due to Escherichia coli [E. coli]: Secondary | ICD-10-CM | POA: Diagnosis not present

## 2023-03-09 DIAGNOSIS — N320 Bladder-neck obstruction: Secondary | ICD-10-CM | POA: Diagnosis present

## 2023-03-09 DIAGNOSIS — Z9841 Cataract extraction status, right eye: Secondary | ICD-10-CM

## 2023-03-09 DIAGNOSIS — G629 Polyneuropathy, unspecified: Secondary | ICD-10-CM | POA: Diagnosis present

## 2023-03-09 DIAGNOSIS — Z79899 Other long term (current) drug therapy: Secondary | ICD-10-CM

## 2023-03-09 DIAGNOSIS — R531 Weakness: Secondary | ICD-10-CM | POA: Diagnosis not present

## 2023-03-09 DIAGNOSIS — R0989 Other specified symptoms and signs involving the circulatory and respiratory systems: Secondary | ICD-10-CM | POA: Diagnosis not present

## 2023-03-09 DIAGNOSIS — D638 Anemia in other chronic diseases classified elsewhere: Secondary | ICD-10-CM | POA: Diagnosis not present

## 2023-03-09 DIAGNOSIS — I10 Essential (primary) hypertension: Secondary | ICD-10-CM | POA: Diagnosis not present

## 2023-03-09 DIAGNOSIS — A419 Sepsis, unspecified organism: Secondary | ICD-10-CM | POA: Diagnosis not present

## 2023-03-09 DIAGNOSIS — I499 Cardiac arrhythmia, unspecified: Secondary | ICD-10-CM | POA: Diagnosis not present

## 2023-03-09 DIAGNOSIS — Z961 Presence of intraocular lens: Secondary | ICD-10-CM | POA: Diagnosis present

## 2023-03-09 DIAGNOSIS — R509 Fever, unspecified: Secondary | ICD-10-CM | POA: Diagnosis not present

## 2023-03-09 DIAGNOSIS — Z9049 Acquired absence of other specified parts of digestive tract: Secondary | ICD-10-CM

## 2023-03-09 DIAGNOSIS — Z743 Need for continuous supervision: Secondary | ICD-10-CM | POA: Diagnosis not present

## 2023-03-09 DIAGNOSIS — J90 Pleural effusion, not elsewhere classified: Secondary | ICD-10-CM | POA: Diagnosis not present

## 2023-03-09 DIAGNOSIS — Z8774 Personal history of (corrected) congenital malformations of heart and circulatory system: Secondary | ICD-10-CM

## 2023-03-09 DIAGNOSIS — Z888 Allergy status to other drugs, medicaments and biological substances status: Secondary | ICD-10-CM

## 2023-03-09 DIAGNOSIS — R7303 Prediabetes: Secondary | ICD-10-CM | POA: Diagnosis present

## 2023-03-09 DIAGNOSIS — N200 Calculus of kidney: Secondary | ICD-10-CM | POA: Diagnosis not present

## 2023-03-09 DIAGNOSIS — Z87442 Personal history of urinary calculi: Secondary | ICD-10-CM

## 2023-03-09 LAB — COMPREHENSIVE METABOLIC PANEL
ALT: 19 U/L (ref 0–44)
AST: 26 U/L (ref 15–41)
Albumin: 3.2 g/dL — ABNORMAL LOW (ref 3.5–5.0)
Alkaline Phosphatase: 78 U/L (ref 38–126)
Anion gap: 11 (ref 5–15)
BUN: 18 mg/dL (ref 8–23)
CO2: 22 mmol/L (ref 22–32)
Calcium: 8.9 mg/dL (ref 8.9–10.3)
Chloride: 108 mmol/L (ref 98–111)
Creatinine, Ser: 1.08 mg/dL (ref 0.61–1.24)
GFR, Estimated: >60 mL/min (ref >60)
Glucose, Bld: 182 mg/dL — ABNORMAL HIGH (ref 70–99)
Potassium: 3.7 mmol/L (ref 3.5–5.1)
Sodium: 141 mmol/L (ref 135–145)
Total Bilirubin: 1.4 mg/dL — ABNORMAL HIGH (ref 0.0–1.2)
Total Protein: 8.5 g/dL — ABNORMAL HIGH (ref 6.5–8.1)

## 2023-03-09 LAB — URINALYSIS, W/ REFLEX TO CULTURE (INFECTION SUSPECTED)
Bilirubin Urine: NEGATIVE
Glucose, UA: NEGATIVE mg/dL
Ketones, ur: NEGATIVE mg/dL
Nitrite: NEGATIVE
Protein, ur: 100 mg/dL — AB
RBC / HPF: >50 RBC/hpf (ref 0–5)
Specific Gravity, Urine: 1.009 (ref 1.005–1.030)
WBC, UA: >50 WBC/hpf (ref 0–5)
pH: 7 (ref 5.0–8.0)

## 2023-03-09 LAB — PROTIME-INR
INR: 1.7 — ABNORMAL HIGH (ref 0.8–1.2)
Prothrombin Time: 19.9 s — ABNORMAL HIGH (ref 11.4–15.2)

## 2023-03-09 LAB — CBC WITH DIFFERENTIAL/PLATELET
Abs Immature Granulocytes: 0.09 10*3/uL — ABNORMAL HIGH (ref 0.00–0.07)
Basophils Absolute: 0 10*3/uL (ref 0.0–0.1)
Basophils Relative: 0 %
Eosinophils Absolute: 0 10*3/uL (ref 0.0–0.5)
Eosinophils Relative: 0 %
HCT: 31.5 % — ABNORMAL LOW (ref 39.0–52.0)
Hemoglobin: 9.6 g/dL — ABNORMAL LOW (ref 13.0–17.0)
Immature Granulocytes: 1 %
Lymphocytes Relative: 7 %
Lymphs Abs: 0.9 10*3/uL (ref 0.7–4.0)
MCH: 27.9 pg (ref 26.0–34.0)
MCHC: 30.5 g/dL (ref 30.0–36.0)
MCV: 91.6 fL (ref 80.0–100.0)
Monocytes Absolute: 0.8 10*3/uL (ref 0.1–1.0)
Monocytes Relative: 6 %
Neutro Abs: 11.8 10*3/uL — ABNORMAL HIGH (ref 1.7–7.7)
Neutrophils Relative %: 86 %
Platelets: 175 10*3/uL (ref 150–400)
RBC: 3.44 MIL/uL — ABNORMAL LOW (ref 4.22–5.81)
RDW: 16.8 % — ABNORMAL HIGH (ref 11.5–15.5)
WBC: 13.7 10*3/uL — ABNORMAL HIGH (ref 4.0–10.5)
nRBC: 0 % (ref 0.0–0.2)

## 2023-03-09 LAB — APTT: aPTT: 49 s — ABNORMAL HIGH (ref 24–36)

## 2023-03-09 LAB — RESP PANEL BY RT-PCR (RSV, FLU A&B, COVID)  RVPGX2
Influenza A by PCR: NEGATIVE
Influenza B by PCR: NEGATIVE
Resp Syncytial Virus by PCR: NEGATIVE
SARS Coronavirus 2 by RT PCR: NEGATIVE

## 2023-03-09 LAB — LIPASE, BLOOD: Lipase: 32 U/L (ref 11–51)

## 2023-03-09 LAB — I-STAT CG4 LACTIC ACID, ED: Lactic Acid, Venous: 1.9 mmol/L (ref 0.5–1.9)

## 2023-03-09 MED ORDER — IOHEXOL 300 MG/ML  SOLN
100.0000 mL | Freq: Once | INTRAMUSCULAR | Status: AC | PRN
  Administered 2023-03-09: 100 mL via INTRAVENOUS

## 2023-03-09 MED ORDER — SODIUM CHLORIDE 0.9 % IV BOLUS
500.0000 mL | Freq: Once | INTRAVENOUS | Status: AC
  Administered 2023-03-09: 500 mL via INTRAVENOUS

## 2023-03-09 MED ORDER — SODIUM CHLORIDE 0.9 % IV SOLN
1.0000 g | Freq: Once | INTRAVENOUS | Status: AC
  Administered 2023-03-10: 1 g via INTRAVENOUS
  Filled 2023-03-09: qty 20

## 2023-03-09 MED ORDER — ACETAMINOPHEN 325 MG PO TABS
650.0000 mg | ORAL_TABLET | Freq: Once | ORAL | Status: AC
  Administered 2023-03-09: 650 mg via ORAL
  Filled 2023-03-09: qty 2

## 2023-03-09 NOTE — Progress Notes (Signed)
 ED Pharmacy Antibiotic Sign Off An antibiotic consult was received from an ED provider for Meropenem per pharmacy dosing for sepsis. A chart review was completed to assess appropriateness.   The following one time order(s) were placed:  Meropenem 1gm IV   Further antibiotic and/or antibiotic pharmacy consults should be ordered by the admitting provider if indicated.   Thank you for allowing pharmacy to be a part of this patient's care.   Junita Push, [facility]  Clinical Pharmacist 03/09/23 11:57 PM

## 2023-03-09 NOTE — ED Provider Notes (Addendum)
 [place] EMERGENCY DEPARTMENT AT [facility] Provider Note   CSN: [id number] Arrival date & time: 03/09/23  2008     History  Chief Complaint  Patient presents with   Weakness    [name]. is a 88 y.o. male with an extensive medical history as noted below presented to ED from his nursing facility with concern for nausea, vomiting, weakness, and fever.  Patient was discharged from the hospital to nursing facility rehab approximately 2 weeks ago.  Medical history of my review of recent discharge included sepsis bacteremia that was treated with IV antibiotics in the hospital, intermittent bowel obstruction managed conservatively at the end of January.  Heart failure requiring diuresis.  Dyspnea and hypoxia due to aspiration pneumonia.  Subsequently the CT scan at the end of January showing mild hydronephrosis with a 12 mm right proximal ureteral stone, with urology consultation and cystoscopy and right ureteral stent placement.  He has a Foley catheter in place.  While in the hospital in early February the patient developed left-sided weakness and was found to have an acute left frontal and left temporal stroke.  Her recent medical record review the patient is DNR DNI.  HPI     Home Medications Prior to Admission medications   Medication Sig Start Date End Date Taking? Authorizing Provider  allopurinol (ZYLOPRIM) 300 MG tablet Take 150 mg by mouth every morning.    [provider]  amLODipine (NORVASC) 10 MG tablet TAKE 1 TABLET BY MOUTH DAILY 07/17/22   Jake Bathe, MD  atenolol (TENORMIN) 25 MG tablet TAKE ONE-HALF TABLET BY MOUTH  DAILY 09/20/22   Jake Bathe, MD  cholecalciferol (VITAMIN D3) 25 MCG (1000 UNIT) tablet Take 1,000 Units by mouth every evening.    [provider]  Cinnamon 500 MG capsule Take 500 mg by mouth daily.    [provider]  ferrous sulfate 325 (65 FE) MG EC tablet Take 325 mg by mouth 2 (two) times daily.  10/29/19   [provider]  finasteride (PROSCAR) 5 MG tablet Take 1 tablet (5 mg total) by mouth daily. 11/05/16   Angiulli, Mcarthur Rossetti, PA-C  magnesium hydroxide (MILK OF MAGNESIA) 400 MG/5ML suspension Take 30 mLs by mouth daily as needed for mild constipation.    [provider]  Multiple Vitamins-Minerals (CENTRUM SILVER ADULT 50+) TABS Take 1 tablet by mouth daily.    [provider]  polyethylene glycol (MIRALAX / GLYCOLAX) 17 g packet Take 17 g by mouth daily.    [provider]  rosuvastatin (CRESTOR) 10 MG tablet TAKE 1 TABLET BY MOUTH DAILY 07/17/22   Jake Bathe, MD  senna-docusate (SENOKOT-S) 8.6-50 MG tablet Take 2 tablets by mouth daily.    [provider]  silodosin (RAPAFLO) 8 MG CAPS capsule Take 8 mg by mouth daily. 11/02/21   [provider]  traZODone (DESYREL) 50 MG tablet Take 25 mg by mouth at bedtime as needed for sleep.    [provider]  warfarin (COUMADIN) 5 MG tablet TAKE 1 TO 1 AND 1/2 TABLETS BY  MOUTH DAILY OR AS DIRECTED BY  ANTICOAGULATION CLINIC Patient taking differently: Take 5 mg by mouth every evening. 03/04/23   Jake Bathe, MD      Allergies    Sulfa antibiotics, Sulfamethoxazole-trimethoprim, and Zestril [lisinopril]    Review of Systems   Review of Systems  Physical Exam Updated Vital Signs BP (!) 103/47   Pulse 60  Temp 100 F (37.8 C) (Oral)   Resp (!) 22   Ht 6' (1.829 m)   Wt 83.9 kg   SpO2 92%   BMI 25.09 kg/m  Physical Exam Constitutional:      General: He is not in acute distress. HENT:     Head: Normocephalic and atraumatic.  Eyes:     Conjunctiva/sclera: Conjunctivae normal.     Pupils: Pupils are equal, round, and reactive to light.  Cardiovascular:     Rate and Rhythm: Normal rate and regular rhythm.  Pulmonary:     Effort: Pulmonary effort is normal. No respiratory distress.  Abdominal:     General: There is no distension.     Tenderness: There is no  abdominal tenderness.  Genitourinary:    Comments: Foley in place with clear urine output Skin:    General: Skin is warm and dry.  Neurological:     Mental Status: He is alert. Mental status is at baseline.     ED Results / Procedures / Treatments   Labs (all labs ordered are listed, but only abnormal results are displayed) Labs Reviewed  COMPREHENSIVE METABOLIC PANEL - Abnormal; Notable for the following components:      Result Value   Glucose, Bld 182 (*)    Total Protein 8.5 (*)    Albumin 3.2 (*)    Total Bilirubin 1.4 (*)    All other components within normal limits  CBC WITH DIFFERENTIAL/PLATELET - Abnormal; Notable for the following components:   WBC 13.7 (*)    RBC 3.44 (*)    Hemoglobin 9.6 (*)    HCT 31.5 (*)    RDW 16.8 (*)    Neutro Abs 11.8 (*)    Abs Immature Granulocytes 0.09 (*)    All other components within normal limits  PROTIME-INR - Abnormal; Notable for the following components:   Prothrombin Time 19.9 (*)    INR 1.7 (*)    All other components within normal limits  APTT - Abnormal; Notable for the following components:   aPTT 49 (*)    All other components within normal limits  URINALYSIS, W/ REFLEX TO CULTURE (INFECTION SUSPECTED) - Abnormal; Notable for the following components:   APPearance CLOUDY (*)    Hgb urine dipstick MODERATE (*)    Protein, ur 100 (*)    Leukocytes,Ua LARGE (*)    Bacteria, UA MANY (*)    All other components within normal limits  RESP PANEL BY RT-PCR (RSV, FLU A&B, COVID)  RVPGX2  CULTURE, BLOOD (ROUTINE X 2)  CULTURE, BLOOD (ROUTINE X 2)  URINE CULTURE  LIPASE, BLOOD  I-STAT CG4 LACTIC ACID, ED    EKG EKG Interpretation Date/Time:  Saturday March 09 2023 21:00:12 EST Ventricular Rate:  66 PR Interval:    QRS Duration:  101 QT Interval:  564 QTC Calculation: 592 R Axis:   102  Text Interpretation: Atrail fibrillation, prolonged QTC Confirmed by Alvester Chou [phone]) on 03/09/2023 11:27:55  PM  Radiology CT CHEST ABDOMEN PELVIS W CONTRAST Result Date: 03/09/2023 CLINICAL DATA:  Sepsis hx of pneumonia, ureteral stone, UTI - dementia - here with sepsis, nausea vomiting - evaluation for PNA vs intraabdominal infection EXAM: CT CHEST, ABDOMEN, AND PELVIS WITH CONTRAST TECHNIQUE: Multidetector CT imaging of the chest, abdomen and pelvis was performed following the standard protocol during bolus administration of intravenous contrast. RADIATION DOSE REDUCTION: This exam was performed according to the departmental dose-optimization program which includes automated exposure control, adjustment of the mA and/or  kV according to patient size and/or use of iterative reconstruction technique. CONTRAST:  OMNIPAQUE IOHEXOL 300 MG/ML  SOLN COMPARISON:  CT renal 02/06/2023, CT abdomen pelvis 09/23/2021 FINDINGS: CT CHEST FINDINGS Cardiovascular: Enlarged heart size. No significant pericardial effusion. The thoracic aorta is normal in caliber. No atherosclerotic plaque of the thoracic aorta. The main pulmonary measures at the upper limits are normal. No central or segmental pulmonary embolus. Aortic valve replacement. Bicuspid and tricuspid valve replacement. Mediastinum/Nodes: No enlarged mediastinal, hilar, or axillary lymph nodes. Thyroid gland, trachea, and esophagus demonstrate no significant findings. Lungs/Pleura: No focal consolidation. No pulmonary nodule. No pulmonary mass. Trace right pleural effusion. No pneumothorax. Musculoskeletal: No chest wall abnormality. No suspicious lytic or blastic osseous lesions. No acute displaced fracture. Multilevel degenerative changes of the spine. CT ABDOMEN PELVIS FINDINGS Hepatobiliary: No focal liver abnormality. Status post cholecystectomy. No biliary dilatation. Pancreas: No focal lesion. Normal pancreatic contour. No surrounding inflammatory changes. No main pancreatic ductal dilatation. Spleen: Normal in size without focal abnormality. Adrenals/Urinary  Tract: No adrenal nodule bilaterally. Bilateral kidneys enhance symmetrically. Slightly increased size heterogeneous right 8.5 x 8 cm right renal lesion. A 2.7 cm superior right renal pole lesion with a density of 41 Hounsfield units. Subcentimeter hyperdense right renal lesion with a density of 100 Hounsfield units likely hemorrhagic/proteinaceous cyst-no further follow-up indicated. Interval placement of a right ureteral stent with proximal pigtail within a superior renal pole calyx and distal pigtail at the right ureterovesicular junction. Associated interval resolution of right hydronephrosis. Interval trace migration of a 7 mm right calcified stone now in the proximal ureter just distal to the right ureteropelvic junction. No right hydroureter. Right nephrolithiasis measuring up to 9 mm. No left hydroureteronephrosis.  No left nephroureterolithiasis. The urinary bladder is unremarkable. Foley catheter tip terminating within the urinary bladder lumen and Foley catheter inflated balloon terminating within the prostatic urethra. Stomach/Bowel: Stomach is within normal limits. No evidence of bowel wall thickening or dilatation. Colonic diverticulosis. Appendix appears normal. Vascular/Lymphatic: No abdominal aorta or iliac aneurysm. Severe atherosclerotic plaque of the aorta and its branches. No abdominal, pelvic, or inguinal lymphadenopathy. Reproductive: Prostate enlarged measuring up to 5.5 cm. Other: No intraperitoneal free fluid. No intraperitoneal free gas. No organized fluid collection. Musculoskeletal: No abdominal wall hernia or abnormality. No suspicious lytic or blastic osseous lesions. No acute displaced fracture. Multilevel degenerative changes of the spine. Total left hip arthroplasty partially visualized. IMPRESSION: 1. Trace right pleural effusion. 2. Foley catheter tip terminating within the urinary bladder lumen and Foley catheter inflated balloon terminating within the prostatic urethra.  Recommend replacement. 3. Slightly increased size of an indeterminate heterogeneous right 8.5 x 8 cm renal lesion likely representing a hemorrhagic cyst with underlying malignancy not excluded. Indeterminate right superior renal pole lesion measuring 2.7 cm. When the patient is clinically stable and able to follow directions and hold their breath (preferably as an outpatient) further evaluation with dedicated MRI renal protocol should be considered. 4. Interval resolution of right hydronephrosis status post right ureteral stent placement and trace distal migration of a 7 mm calcified stone now in the proximal ureter just distal to the right ureteral pelvic junction. 5. Nonobstructive right nephrolithiasis measuring up to 9 mm. 6. Prostatomegaly. 7. Colonic diverticulosis with no acute diverticulitis. 8.  Aortic Atherosclerosis (ICD10-I70.0). Electronically Signed   By: Tish Frederickson M.D.   On: 03/09/2023 23:25   DG Chest Port 1 View Result Date: 03/09/2023 CLINICAL DATA:  Weakness EXAM: PORTABLE CHEST 1 VIEW COMPARISON:  02/10/2023  FINDINGS: Post sternotomy changes, valve prostheses and atrial appendage clip. Cardiomegaly with vascular congestion and mild diffuse interstitial opacity suggestive of mild edema. No pleural effusion or pneumothorax IMPRESSION: Cardiomegaly with vascular congestion and mild diffuse increased interstitial opacity compared to prior suggesting mild edema Electronically Signed   By: Jasmine Pang M.D.   On: 03/09/2023 21:21    Procedures .Critical Care  Performed by: Terald Sleeper, MD Authorized by: Terald Sleeper, MD   Critical care provider statement:    Critical care time (minutes):  30   Critical care time was exclusive of:  Separately billable procedures and treating other patients   Critical care was necessary to treat or prevent imminent or life-threatening deterioration of the following conditions:  Sepsis   Critical care was time spent personally by me on the  following activities:  Ordering and performing treatments and interventions, ordering and review of laboratory studies, ordering and review of radiographic studies, pulse oximetry, review of old charts, examination of patient and evaluation of patient's response to treatment   Care discussed with: admitting provider       Medications Ordered in ED Medications  acetaminophen (TYLENOL) tablet 650 mg (650 mg Oral Given 03/09/23 2109)  sodium chloride 0.9 % bolus 500 mL (500 mLs Intravenous New Bag/Given 03/09/23 2322)  iohexol (OMNIPAQUE) 300 MG/ML solution 100 mL (100 mLs Intravenous Contrast Given 03/09/23 2235)    ED Course/ Medical Decision Making/ A&P Clinical Course as of 03/09/23 2347  Sat Mar 09, 2023  2346 Admitted to Dr Allena Katz hospitalist [MT]    Clinical Course User Index [MT] Renaye Rakers Kermit Balo, MD                                 Medical Decision Making Amount and/or Complexity of Data Reviewed Labs: ordered. Radiology: ordered.  Risk OTC drugs. Prescription drug management.   This patient presents to the ED with concern for fevers, chills, weakness and vomiting. This involves an extensive number of treatment options, and is a complaint that carries with it a high risk of complications and morbidity.  The differential diagnosis includes infection including ureteral infection versus aspiration pneumonia versus intra-abdominal infection or obstruction versus viral URI versus other  Co-morbidities that complicate the patient evaluation: History of sepsis, indwelling Foley, aspiration risk,  Additional history obtained from EMS  External records from outside source obtained and reviewed including hospital discharge summary from February, as noted above  I ordered and personally interpreted labs.  The pertinent results include:  Blood cell count 13.7.  Lactate within normal limits.  CMP largely unremarkable.  Hemoglobin at baseline anemia 9.6.  UA from indwelling Foley with large  Leuks, negative nitrties  I ordered imaging studies including x-ray of the chest, CT chest abdomen pelvis I independently visualized and interpreted imaging which showed cardiomegaly with vascular congestion of the chest, no evident infiltrate; resolution of prior hydronephrosis.  Patient has noted to have potentially incorrectly placed indwelling Foley with balloon your prostate which is enlarged. I agree with the radiologist interpretation  The patient was maintained on a cardiac monitor.  I personally viewed and interpreted the cardiac monitored which showed an underlying rhythm of: Sinus rhythm  Per my interpretation the patient's ECG shows A-fib with some baseline wander, prolonged QTc  I ordered medication including small fluid bolus for potential sepsis, with cautious fluids given history of heart failure and normal lactate here.  Tylenol for  fever.  IV meropenem based on prior treatment plan and culture susceptibilities for BS antibiotics  I have reviewed the patients home medicines and have made adjustments as needed  Test Considered: Low suspicion for acute PE, meningitis  After the interventions noted above, I reevaluated the patient and found that they have: stayed the same  Plan for Urosepsis admission given his recent history - RN advised to exchange foley   Dispostion:  After consideration of the diagnostic results and the patients response to treatment, I feel that the patent would benefit from medical admission     Final Clinical Impression(s) / ED Diagnoses Final diagnoses:  Sepsis, due to unspecified organism, unspecified whether acute organ dysfunction present [facility])  Urinary tract infection without hematuria, site unspecified    Rx / DC Orders ED Discharge Orders     None         Casie Sturgeon, Kermit Balo, MD 03/09/23 2332    Terald Sleeper, MD 03/09/23 [phone]

## 2023-03-09 NOTE — ED Triage Notes (Signed)
 From home for [facility] weakness, and vomiting with fever. started 3hr ago. D/c from [facility] on 2/28 for sepsis. Foley cath in for kidney stone, scheduled for surgery for the stone for 03/12/2023. RR 30, HR 62, cbg 199, BP 133/62 temp 101.

## 2023-03-09 NOTE — H&P (Signed)
 History and Physical    [name]. UEA:[id number] DOB: Jan 28, 1935 DOA: 03/09/2023  PCP: Jarrett Soho, PA-C  Patient coming from: SNF  I have personally briefly reviewed patient's old medical records in [facility]  Chief Complaint: Nausea, vomiting, generalized weakness  HPI: Gerald Sunga. is a 88 y.o. male with medical history significant for permanent atrial fibrillation s/p maze on Coumadin, valvular heart disease (s/p bioprosthetic AVR, MV and TV repair), severe pulmonary HTN, nonobstructive CAD, HFpEF (EF 60-65%), recent CVA, anemia of chronic disease, right ureteral stone s/p stent (02/07/2023), indwelling Foley catheter, HTN, right renal hemorrhagic cyst who presented from SNF for evaluation of nausea, vomiting and generalized weakness.  Patient states that he has been ambulating with use of a walker fairly well at his SNF.  Over the last 24 hours he has become generally weak and has been unable to even stand up with his walker unless he has help.  He has noticed some discomfort at the insertion point of his Foley catheter.  He has had some nausea and vomiting today.  He denies chest pain, dyspnea, abdominal pain.  He was recently admitted 1/25-2/14 for multiple issues including Enterobacter cloacae UTI/bacteremia, right ureteral calculus s/p stenting on 1/30, ileus, AKI, anemia and thrombocytopenia (received 2 units platelets, 1 unit FFP, 4 units PRBCs), and small acute left frontal and left temporal white matter infarcts thought to be related to FFP/platelet infusion.  MRA showed severe stenosis of right P2 PCA.  ED Course  Labs/Imaging on admission: I have personally reviewed following labs and imaging studies.  Initial vitals showed BP 147/54, pulse 66, RR 27, temp 100.0 F, SpO2 94% on room air.  Labs showed WBC 13.7, hemoglobin 9.6, platelets 175,000, sodium 141, potassium 3.7, bicarb 22, BUN 18, creatinine 1.08, serum glucose 182, INR 1.7, lipase 32,  lactic acid 1.9.  UA with negative nitrites, large leukocytes, >50 WBCs and RBCs, many bacteria.  Blood and urine cultures in process.  COVID, influenza, RSV PCR negative.  CT chest/abdomen/pelvis with contrast IMPRESSION: 1. Trace right pleural effusion. 2. Foley catheter tip terminating within the urinary bladder lumen and Foley catheter inflated balloon terminating within the prostatic urethra. Recommend replacement. 3. Slightly increased size of an indeterminate heterogeneous right 8.5 x 8 cm renal lesion likely representing a hemorrhagic cyst with underlying malignancy not excluded. Indeterminate right superior renal pole lesion measuring 2.7 cm. When the patient is clinically stable and able to follow directions and hold their breath (preferably as an outpatient) further evaluation with dedicated MRI renal protocol should be considered. 4. Interval resolution of right hydronephrosis status post right ureteral stent placement and trace distal migration of a 7 mm calcified stone now in the proximal ureter just distal to the right ureteral pelvic junction. 5. Nonobstructive right nephrolithiasis measuring up to 9 mm. 6. Prostatomegaly. 7. Colonic diverticulosis with no acute diverticulitis. 8.  Aortic Atherosclerosis (ICD10-I70.0).  Patient was given 500 cc normal saline, IV meropenem.  Foley catheter to be exchanged.  The hospitalist service was consulted to admit for further evaluation and management.  Review of Systems: All systems reviewed and are negative except as documented in history of present illness above.   Past Medical History:  Diagnosis Date   Acquired dilation of ascending aorta and aortic root (HCC)    43mm aortic root and 41mm ascending aorta on echo 03/2020   Anemia    Arthritis    Bladder stones    Borderline diabetes  BPH (benign prostatic hyperplasia)    CAD (coronary artery disease) 12/19/2020   Non-obstructive coronary artery disease at cath in 2019    Complication of anesthesia    problem with voiding after anesthesia,   Coronary artery disease    cardiologist-  dr Rockey Situ gerhart NP--- per cath 06-02-2010 non-obstructive cad pLAD 30-40%   CVA (cerebral vascular accident) (HCC) 10/24/2016   Diverticulosis of colon    Dysrhythmia    afib   Gout    Heart murmur    History of adenomatous polyp of colon    2002-- tubular adenoma   History of aortic insufficiency    severe -- s/p  AVR 08-03-2010   History of small bowel obstruction    02/ 2007 mechanical sbo s/p  surgical intervention;  partial sbo 09/ 2011 and 03-20-2011 resolved without surgical intervention   History of urinary retention    HTN (hypertension)    Lower extremity edema    Other secondary pulmonary hypertension (HCC) 03/20/2022   TTE 03/19/22: EF 65-70, no RWMA, moderate LVH, normal RVSF, severe pulmonary hypertension (RVSP 60.4), severe BAE, normal structure and function of mitral valve repair (mean gradient 9), mild TR, trivial AI, normal structure and function of AVR (mean 15.4), moderate PI, aortic root and ascending aorta 40 mm, RAP 15   Peripheral neuropathy    Persistent atrial fibrillation (HCC)    Pre-diabetes    S/P aortic valve replacement with prosthetic valve 08/03/2010   tissue valve   S/P Maze operation for atrial fibrillation 01/30/2018   Complete bilateral atrial lesion set using bipolar radiofrequency and cryothermy with clipping of LA appendage   S/P MVR (mitral valve repair) 01/30/2018   Complex valvuloplasty including artificial Gore-tex neochord placement x4 and Carbo medics Annuloflex ring annuloplasty, size 28   S/P patent foramen ovale closure 08/03/2010   at same time AVR   S/P tricuspid valve repair 01/30/2018   Using an MC3 Annuloplasty ring, size 28   Stroke (HCC)    Thrombocytopenia (HCC)    Tricuspid regurgitation     Past Surgical History:  Procedure Laterality Date   BIOPSY  09/07/2018   Procedure: BIOPSY;  Surgeon:  Kathi Der, MD;  Location: [facility];  Service: Gastroenterology;;   CARDIAC CATHETERIZATION  06-02-2010  dr Anne Fu   non-obstructive cad- pLAD 30-40%/  normal LVSF/  severe AI   CARDIOVASCULAR STRESS TEST  04/12/2016   Low risk nuclear perfusion study w/ no significant reversible ischemia/  normal LV function and wall motion ,  stress ef 60%/  2mm inferior and lateral scooped ST-segment depression w/ exercise (may be repolarization abnormality), exercise capacity was moderately reduced   CATARACT EXTRACTION W/ INTRAOCULAR LENS  IMPLANT, BILATERAL  02/2010   CLIPPING OF ATRIAL APPENDAGE N/A 01/30/2018   Procedure: CLIPPING OF LEFT ATRIAL APPENDAGE USING ATRICLIP PRO2 ;  Surgeon: Purcell Nails, MD;  Location: [facility] OR;  Service: Open Heart Surgery;  Laterality: N/A;   COLONOSCOPY WITH PROPOFOL N/A 10/21/2018   Procedure: COLONOSCOPY WITH PROPOFOL;  Surgeon: Kathi Der, MD;  Location: [facility];  Service: Gastroenterology;  Laterality: N/A;   CYSTOSCOPY W/ URETERAL STENT PLACEMENT Right 02/07/2023   Procedure: CYSTOSCOPY WITH RETROGRADE PYELOGRAM/URETERAL STENT PLACEMENT;  Surgeon: Crist Fat, MD;  Location: [facility];  Service: Urology;  Laterality: Right;   CYSTOSCOPY WITH LITHOLAPAXY N/A 06/05/2016   Procedure: CYSTOSCOPY WITH LITHOLAPAXY and fulgarization of bladder neck;  Surgeon: Bjorn Pippin, MD;  Location: [facility];  Service: Urology;  Laterality: N/A;   ESOPHAGOGASTRODUODENOSCOPY (EGD) WITH PROPOFOL N/A 09/07/2018   Procedure: ESOPHAGOGASTRODUODENOSCOPY (EGD) WITH PROPOFOL;  Surgeon: Kathi Der, MD;  Location: [facility];  Service: Gastroenterology;  Laterality: N/A;   EXPLORATORY LAPARTOMY /  CHOLECYSTECTOMY  02/28/2005   for Small  bowel obstruction (mechnical)   IR ANGIO EXTRACRAN SEL COM CAROTID INNOMINATE UNI L MOD SED  10/24/2016   IR ANGIO VERTEBRAL SEL SUBCLAVIAN INNOMINATE BILAT MOD SED  10/24/2016   IR PERCUTANEOUS ART  THROMBECTOMY/INFUSION INTRACRANIAL INC DIAG ANGIO  10/24/2016   IR RADIOLOGIST EVAL & MGMT  12/05/2016   LEFT KNEE ARTHROSCOPY  2006   MAZE N/A 01/30/2018   Procedure: MAZE;  Surgeon: Purcell Nails, MD;  Location: [facility] OR;  Service: Open Heart Surgery;  Laterality: N/A;   MITRAL VALVE REPAIR N/A 01/30/2018   Procedure: MITRAL VALVE REPAIR (MVR) USING CARBOMEDICS ANNULOFLEX SIZE 28;  Surgeon: Purcell Nails, MD;  Location: [facility] OR;  Service: Open Heart Surgery;  Laterality: N/A;   POLYPECTOMY  10/21/2018   Procedure: POLYPECTOMY;  Surgeon: Kathi Der, MD;  Location: [facility];  Service: Gastroenterology;;   RADIOLOGY WITH ANESTHESIA N/A 10/24/2016   Procedure: RADIOLOGY WITH ANESTHESIA;  Surgeon: Julieanne Cotton, MD;  Location: [facility] OR;  Service: Radiology;  Laterality: N/A;   RIGHT FOOT SURGERY     RIGHT MINIATURE ANTERIOR THORACOTOMY FOR AORTIC VALVE REPLACEMENT AND CLOSURE PATENT FORAMEN OVALE  08-03-2010  DR Neldon Labella Magna-ease pericardial tissue valve (25mm)   RIGHT/LEFT HEART CATH AND CORONARY ANGIOGRAPHY N/A 10/14/2017   Procedure: RIGHT/LEFT HEART CATH AND CORONARY ANGIOGRAPHY;  Surgeon: Tonny Bollman, MD;  Location: [facility] INVASIVE CV LAB;  Service: Cardiovascular;  Laterality: N/A;   TEE WITHOUT CARDIOVERSION N/A 10/14/2017   Procedure: TRANSESOPHAGEAL ECHOCARDIOGRAM (TEE);  Surgeon: Wendall Stade, MD;  Location: [facility] ENDOSCOPY;  Service: Cardiovascular;  Laterality: N/A;   TOTAL HIP ARTHROPLASTY Left 08/02/2020   Procedure: TOTAL HIP ARTHROPLASTY ANTERIOR APPROACH;  Surgeon: Durene Romans, MD;  Location: [facility];  Service: Orthopedics;  Laterality: Left;   TOTAL KNEE ARTHROPLASTY Left 09/19/2021   Procedure: TOTAL KNEE ARTHROPLASTY;  Surgeon: Durene Romans, MD;  Location: [facility];  Service: Orthopedics;  Laterality: Left;   TOTAL KNEE ARTHROPLASTY Right 08/09/2022   Procedure: TOTAL KNEE ARTHROPLASTY;  Surgeon: Durene Romans, MD;  Location: [facility];  Service: Orthopedics;   Laterality: Right;  90   TRANSTHORACIC ECHOCARDIOGRAM  05/30/2016  dr skains   moderate  LVH ef 60-65%/  bioprothesis aortic valve present ,normal grandient and no AI /  mild MV calcification , moderate MR /  mild PR/ moderate TR/  PASP 60mmHg/ (RA denisty was identified 04-27-2016 echo) and is seen again today, this is likely a promient eustacian ridge, atrium is normal size   TRICUSPID VALVE REPLACEMENT N/A 01/30/2018   Procedure: TRICUSPID VALVE REPAIR USING MC3 SIZE 28;  Surgeon: Purcell Nails, MD;  Location: [facility] OR;  Service: Open Heart Surgery;  Laterality: N/A;    Social History:  reports that he has never smoked. He has never used smokeless tobacco. He reports current alcohol use. He reports that he does not use drugs.  Allergies  Allergen Reactions   Sulfa Antibiotics Other (See Comments)    Granulocytosis   Cefepime Other (See Comments)    encephalopathy   Sulfamethoxazole-Trimethoprim     Listed on MAR   Zestril [Lisinopril] Cough    Family History  Problem Relation Age of Onset   Heart disease Mother    Brain  cancer Father      Prior to Admission medications   Medication Sig Start Date End Date Taking? Authorizing Provider  allopurinol (ZYLOPRIM) 300 MG tablet Take 150 mg by mouth every morning.    [provider]  amLODipine (NORVASC) 10 MG tablet TAKE 1 TABLET BY MOUTH DAILY 07/17/22   Jake Bathe, MD  atenolol (TENORMIN) 25 MG tablet TAKE ONE-HALF TABLET BY MOUTH  DAILY 09/20/22   Jake Bathe, MD  cholecalciferol (VITAMIN D3) 25 MCG (1000 UNIT) tablet Take 1,000 Units by mouth every evening.    [provider]  Cinnamon 500 MG capsule Take 500 mg by mouth daily.    [provider]  ferrous sulfate 325 (65 FE) MG EC tablet Take 325 mg by mouth 2 (two) times daily. 10/29/19   [provider]  finasteride (PROSCAR) 5 MG tablet Take 1 tablet (5 mg total) by mouth daily. 11/05/16   Angiulli, Mcarthur Rossetti, PA-C  magnesium hydroxide  (MILK OF MAGNESIA) 400 MG/5ML suspension Take 30 mLs by mouth daily as needed for mild constipation.    [provider]  Multiple Vitamins-Minerals (CENTRUM SILVER ADULT 50+) TABS Take 1 tablet by mouth daily.    [provider]  polyethylene glycol (MIRALAX / GLYCOLAX) 17 g packet Take 17 g by mouth daily.    [provider]  rosuvastatin (CRESTOR) 10 MG tablet TAKE 1 TABLET BY MOUTH DAILY 07/17/22   Jake Bathe, MD  senna-docusate (SENOKOT-S) 8.6-50 MG tablet Take 2 tablets by mouth daily.    [provider]  silodosin (RAPAFLO) 8 MG CAPS capsule Take 8 mg by mouth daily. 11/02/21   [provider]  traZODone (DESYREL) 50 MG tablet Take 25 mg by mouth at bedtime as needed for sleep.    [provider]  warfarin (COUMADIN) 5 MG tablet TAKE 1 TO 1 AND 1/2 TABLETS BY  MOUTH DAILY OR AS DIRECTED BY  ANTICOAGULATION CLINIC Patient taking differently: Take 5 mg by mouth every evening. 03/04/23   Jake Bathe, MD    Physical Exam: Vitals:   03/09/23 2215 03/09/23 2300 03/09/23 2315 03/10/23 0050  BP: (!) 103/47     Pulse: 69 (!) 51 60   Resp: (!) 21 (!) 26 (!) 22   Temp:    98.4 F (36.9 C)  TempSrc:    Oral  SpO2: 93% 91% 92%   Weight:      Height:       Constitutional: Resting in bed with head elevated, NAD, calm, comfortable Eyes: EOMI, lids and conjunctivae normal ENMT: Mucous membranes are moist. Posterior pharynx clear of any exudate or lesions.Normal dentition.  Neck: normal, supple, no masses. Respiratory: clear to auscultation bilaterally, no wheezing, no crackles. Normal respiratory effort. No accessory muscle use.  Cardiovascular: Irregularly irregular, stock murmur present. No extremity edema. 2+ pedal pulses. Abdomen: no tenderness, no masses palpated. GU: Foley catheter in place Musculoskeletal: no clubbing / cyanosis. No joint deformity upper and lower extremities. Good ROM, no contractures. Normal muscle tone.  Skin:  no rashes, lesions, ulcers. No induration Neurologic: Sensation intact. Strength 5/5 in all 4.  Psychiatric: Normal judgment and insight. Alert and oriented x 3. Normal mood.   EKG: Personally reviewed. Atrial fibrillation, rate 66, prolonged QTc.  Rate is slower when compared to previous.  Assessment/Plan Principal Problem:   Urinary tract infection associated with indwelling urethral catheter [facility]) Active Problems:   Essential hypertension   Permanent atrial fibrillation (HCC)  Benign prostatic hyperplasia with lower urinary tract symptoms   History of ischemic right MCA stroke   Long term current use of anticoagulant   Anemia of chronic disease   [name]. is a 88 y.o. male with medical history significant for permanent atrial fibrillation s/p maze on Coumadin, valvular heart disease (s/p bioprosthetic AVR, MV and TV repair), severe pulmonary HTN, nonobstructive CAD, HFpEF (EF 60-65%), recent CVA, anemia of chronic disease, right ureteral stone s/p stent (02/07/2023), indwelling Foley catheter, HTN, right renal hemorrhagic cyst who is admitted with UTI associated with chronic indwelling Foley catheter.  Assessment and Plan: UTI associated with chronic indwelling Foley catheter Recent Enterobacter cloacae UTI/bacteremia Right ureteral stone s/p stent placement 02/07/2023 BPH: Coming from SNF.  CT showed malpositioned Foley catheter, resolution of prior right hydronephrosis s/p ureteral stent placement and migration of 7 mm stone now in the proximal ureter. -Foley catheter to be exchanged in the ED -Continue IV meropenem (reported encephalopathy associated with cefepime on prior admit) -Follow blood and urine cultures -Continue Proscar and Flomax -Patient is scheduled for follow-up cystoscopy with Dr. Annabell Howells on 3/4  Permanent atrial fibrillation S/p maze 2020 S/p bioprosthetic AVR, MV and TV repair: Remains in atrial fibrillation.  Intermittent mild bradycardic episodes.   INR is 1.7. -Holding atenolol for now -Continue Coumadin per pharmacy  Chronic HFpEF with pulmonary HTN: Stable.  Appears euvolemic.  Not on diuretics as an outpatient.  Holding atenolol as above.  History of CVA: Continue rosuvastatin and Coumadin per pharmacy.  Anemia of chronic disease: Hemoglobin stable at 9.6.  Hypertension: Borderline low BP.  Holding atenolol and amlodipine.   DVT prophylaxis: Coumadin per pharmacy Code Status:   Code Status: Limited: Do not attempt resuscitation (DNR) -DNR-LIMITED -Do Not Intubate/DNI discussed with patient on admission Family Communication: Discussed with patient, he has discussed with family Disposition Plan: From SNF and likely return to SNF pending clinical progress Consults called: None Severity of Illness: The appropriate patient status for this patient is OBSERVATION. Observation status is judged to be reasonable and necessary in order to provide the required intensity of service to ensure the patient's safety. The patient's presenting symptoms, physical exam findings, and initial radiographic and laboratory data in the context of their medical condition is felt to place them at decreased risk for further clinical deterioration. Furthermore, it is anticipated that the patient will be medically stable for discharge from the hospital within 2 midnights of admission.   Darreld Mclean MD [facility]  If 7PM-7AM, please contact night-coverage [email or web address]  03/10/2023, 1:18 AM

## 2023-03-10 DIAGNOSIS — R001 Bradycardia, unspecified: Secondary | ICD-10-CM | POA: Diagnosis present

## 2023-03-10 DIAGNOSIS — R7881 Bacteremia: Secondary | ICD-10-CM | POA: Diagnosis not present

## 2023-03-10 DIAGNOSIS — Z96653 Presence of artificial knee joint, bilateral: Secondary | ICD-10-CM | POA: Diagnosis present

## 2023-03-10 DIAGNOSIS — N320 Bladder-neck obstruction: Secondary | ICD-10-CM | POA: Diagnosis not present

## 2023-03-10 DIAGNOSIS — Z8673 Personal history of transient ischemic attack (TIA), and cerebral infarction without residual deficits: Secondary | ICD-10-CM

## 2023-03-10 DIAGNOSIS — Z7901 Long term (current) use of anticoagulants: Secondary | ICD-10-CM | POA: Diagnosis not present

## 2023-03-10 DIAGNOSIS — N390 Urinary tract infection, site not specified: Secondary | ICD-10-CM | POA: Diagnosis not present

## 2023-03-10 DIAGNOSIS — Z953 Presence of xenogenic heart valve: Secondary | ICD-10-CM | POA: Diagnosis not present

## 2023-03-10 DIAGNOSIS — N202 Calculus of kidney with calculus of ureter: Secondary | ICD-10-CM | POA: Diagnosis not present

## 2023-03-10 DIAGNOSIS — I110 Hypertensive heart disease with heart failure: Secondary | ICD-10-CM | POA: Diagnosis not present

## 2023-03-10 DIAGNOSIS — T83511A Infection and inflammatory reaction due to indwelling urethral catheter, initial encounter: Secondary | ICD-10-CM | POA: Diagnosis not present

## 2023-03-10 DIAGNOSIS — Z1152 Encounter for screening for COVID-19: Secondary | ICD-10-CM | POA: Diagnosis not present

## 2023-03-10 DIAGNOSIS — N401 Enlarged prostate with lower urinary tract symptoms: Secondary | ICD-10-CM | POA: Diagnosis present

## 2023-03-10 DIAGNOSIS — I2510 Atherosclerotic heart disease of native coronary artery without angina pectoris: Secondary | ICD-10-CM | POA: Diagnosis not present

## 2023-03-10 DIAGNOSIS — I5032 Chronic diastolic (congestive) heart failure: Secondary | ICD-10-CM | POA: Diagnosis not present

## 2023-03-10 DIAGNOSIS — Z8249 Family history of ischemic heart disease and other diseases of the circulatory system: Secondary | ICD-10-CM | POA: Diagnosis not present

## 2023-03-10 DIAGNOSIS — I2729 Other secondary pulmonary hypertension: Secondary | ICD-10-CM | POA: Diagnosis not present

## 2023-03-10 DIAGNOSIS — N281 Cyst of kidney, acquired: Secondary | ICD-10-CM | POA: Diagnosis not present

## 2023-03-10 DIAGNOSIS — I4821 Permanent atrial fibrillation: Secondary | ICD-10-CM

## 2023-03-10 DIAGNOSIS — I700 Atherosclerosis of aorta: Secondary | ICD-10-CM | POA: Diagnosis not present

## 2023-03-10 DIAGNOSIS — Z96642 Presence of left artificial hip joint: Secondary | ICD-10-CM | POA: Diagnosis present

## 2023-03-10 DIAGNOSIS — T83021A Displacement of indwelling urethral catheter, initial encounter: Secondary | ICD-10-CM | POA: Diagnosis present

## 2023-03-10 DIAGNOSIS — A4151 Sepsis due to Escherichia coli [E. coli]: Secondary | ICD-10-CM | POA: Diagnosis present

## 2023-03-10 DIAGNOSIS — Z882 Allergy status to sulfonamides status: Secondary | ICD-10-CM | POA: Diagnosis not present

## 2023-03-10 DIAGNOSIS — R338 Other retention of urine: Secondary | ICD-10-CM | POA: Diagnosis not present

## 2023-03-10 DIAGNOSIS — Z66 Do not resuscitate: Secondary | ICD-10-CM | POA: Diagnosis not present

## 2023-03-10 DIAGNOSIS — D638 Anemia in other chronic diseases classified elsewhere: Secondary | ICD-10-CM | POA: Diagnosis not present

## 2023-03-10 DIAGNOSIS — N136 Pyonephrosis: Secondary | ICD-10-CM | POA: Diagnosis not present

## 2023-03-10 DIAGNOSIS — A419 Sepsis, unspecified organism: Secondary | ICD-10-CM | POA: Diagnosis not present

## 2023-03-10 DIAGNOSIS — I10 Essential (primary) hypertension: Secondary | ICD-10-CM | POA: Diagnosis not present

## 2023-03-10 DIAGNOSIS — I509 Heart failure, unspecified: Secondary | ICD-10-CM | POA: Diagnosis not present

## 2023-03-10 DIAGNOSIS — Y846 Urinary catheterization as the cause of abnormal reaction of the patient, or of later complication, without mention of misadventure at the time of the procedure: Secondary | ICD-10-CM | POA: Diagnosis present

## 2023-03-10 DIAGNOSIS — N200 Calculus of kidney: Secondary | ICD-10-CM | POA: Diagnosis not present

## 2023-03-10 LAB — BLOOD CULTURE ID PANEL (REFLEXED) - BCID2

## 2023-03-10 LAB — PROTIME-INR
INR: 1.7 — ABNORMAL HIGH (ref 0.8–1.2)
Prothrombin Time: 20.5 s — ABNORMAL HIGH (ref 11.4–15.2)

## 2023-03-10 LAB — CBC
HCT: 26.5 % — ABNORMAL LOW (ref 39.0–52.0)
Hemoglobin: 7.9 g/dL — ABNORMAL LOW (ref 13.0–17.0)
MCH: 27.7 pg (ref 26.0–34.0)
MCHC: 29.8 g/dL — ABNORMAL LOW (ref 30.0–36.0)
MCV: 93 fL (ref 80.0–100.0)
Platelets: 146 10*3/uL — ABNORMAL LOW (ref 150–400)
RBC: 2.85 MIL/uL — ABNORMAL LOW (ref 4.22–5.81)
RDW: 16.7 % — ABNORMAL HIGH (ref 11.5–15.5)
WBC: 12.3 10*3/uL — ABNORMAL HIGH (ref 4.0–10.5)
nRBC: 0 % (ref 0.0–0.2)

## 2023-03-10 LAB — BASIC METABOLIC PANEL
Anion gap: 9 (ref 5–15)
BUN: 19 mg/dL (ref 8–23)
CO2: 22 mmol/L (ref 22–32)
Calcium: 8.4 mg/dL — ABNORMAL LOW (ref 8.9–10.3)
Chloride: 112 mmol/L — ABNORMAL HIGH (ref 98–111)
Creatinine, Ser: 1.08 mg/dL (ref 0.61–1.24)
GFR, Estimated: >60 mL/min (ref >60)
Glucose, Bld: 142 mg/dL — ABNORMAL HIGH (ref 70–99)
Potassium: 3.7 mmol/L (ref 3.5–5.1)
Sodium: 143 mmol/L (ref 135–145)

## 2023-03-10 MED ORDER — ENSURE ENLIVE PO LIQD
237.0000 mL | Freq: Two times a day (BID) | ORAL | Status: DC
  Administered 2023-03-11 – 2023-03-14 (×7): 237 mL via ORAL

## 2023-03-10 MED ORDER — ONDANSETRON HCL 4 MG PO TABS: 4.0000 mg | ORAL_TABLET | Freq: Four times a day (QID) | ORAL | Status: DC | PRN

## 2023-03-10 MED ORDER — ACETAMINOPHEN 325 MG PO TABS
650.0000 mg | ORAL_TABLET | Freq: Four times a day (QID) | ORAL | Status: DC | PRN
  Administered 2023-03-14: 650 mg via ORAL
  Filled 2023-03-10: qty 2

## 2023-03-10 MED ORDER — SODIUM CHLORIDE 0.9% FLUSH
3.0000 mL | Freq: Two times a day (BID) | INTRAVENOUS | Status: DC
  Administered 2023-03-10 – 2023-03-14 (×10): 3 mL via INTRAVENOUS

## 2023-03-10 MED ORDER — SENNOSIDES-DOCUSATE SODIUM 8.6-50 MG PO TABS
1.0000 | ORAL_TABLET | Freq: Every evening | ORAL | Status: DC | PRN
  Administered 2023-03-13 – 2023-03-14 (×2): 1 via ORAL
  Filled 2023-03-10 (×2): qty 1

## 2023-03-10 MED ORDER — CHLORHEXIDINE GLUCONATE CLOTH 2 % EX PADS
6.0000 | MEDICATED_PAD | Freq: Every day | CUTANEOUS | Status: DC
  Administered 2023-03-10 – 2023-03-14 (×4): 6 via TOPICAL

## 2023-03-10 MED ORDER — SODIUM CHLORIDE 0.9 % IV SOLN
2.0000 g | INTRAVENOUS | Status: DC
  Administered 2023-03-10 – 2023-03-11 (×2): 2 g via INTRAVENOUS
  Filled 2023-03-10 (×2): qty 20

## 2023-03-10 MED ORDER — POLYETHYLENE GLYCOL 3350 17 G PO PACK
17.0000 g | PACK | Freq: Every day | ORAL | Status: DC
  Administered 2023-03-14: 17 g via ORAL
  Filled 2023-03-10 (×4): qty 1

## 2023-03-10 MED ORDER — WARFARIN - PHARMACIST DOSING INPATIENT: Freq: Every day | Status: DC

## 2023-03-10 MED ORDER — TRAZODONE HCL 50 MG PO TABS
25.0000 mg | ORAL_TABLET | Freq: Every evening | ORAL | Status: DC | PRN
  Administered 2023-03-11 – 2023-03-13 (×2): 25 mg via ORAL
  Filled 2023-03-10 (×2): qty 1

## 2023-03-10 MED ORDER — ONDANSETRON HCL 4 MG/2ML IJ SOLN: 4.0000 mg | Freq: Four times a day (QID) | INTRAMUSCULAR | Status: DC | PRN

## 2023-03-10 MED ORDER — TAMSULOSIN HCL 0.4 MG PO CAPS
0.4000 mg | ORAL_CAPSULE | Freq: Every day | ORAL | Status: DC
  Administered 2023-03-13 – 2023-03-14 (×2): 0.4 mg via ORAL
  Filled 2023-03-10 (×4): qty 1

## 2023-03-10 MED ORDER — WARFARIN SODIUM 5 MG PO TABS
5.0000 mg | ORAL_TABLET | Freq: Once | ORAL | Status: AC
  Administered 2023-03-10: 5 mg via ORAL
  Filled 2023-03-10: qty 1

## 2023-03-10 MED ORDER — FINASTERIDE 5 MG PO TABS
5.0000 mg | ORAL_TABLET | Freq: Every day | ORAL | Status: DC
  Administered 2023-03-10 – 2023-03-14 (×5): 5 mg via ORAL
  Filled 2023-03-10 (×5): qty 1

## 2023-03-10 MED ORDER — ACETAMINOPHEN 650 MG RE SUPP: 650.0000 mg | Freq: Four times a day (QID) | RECTAL | Status: DC | PRN

## 2023-03-10 MED ORDER — ROSUVASTATIN CALCIUM 10 MG PO TABS
10.0000 mg | ORAL_TABLET | Freq: Every day | ORAL | Status: DC
  Administered 2023-03-10 – 2023-03-14 (×5): 10 mg via ORAL
  Filled 2023-03-10 (×5): qty 1

## 2023-03-10 MED ORDER — SODIUM CHLORIDE 0.9 % IV SOLN
1.0000 g | Freq: Three times a day (TID) | INTRAVENOUS | Status: DC
  Administered 2023-03-10 (×2): 1 g via INTRAVENOUS
  Filled 2023-03-10 (×3): qty 20

## 2023-03-10 NOTE — Progress Notes (Signed)
 PHARMACY - ANTICOAGULATION CONSULT NOTE  Pharmacy Consult for Coumadin Indication: atrial fibrillation   Allergies  Allergen Reactions   Sulfa Antibiotics Other (See Comments)    Granulocytosis   Cefepime Other (See Comments)    encephalopathy   Sulfamethoxazole-Trimethoprim     Listed on MAR   Zestril [Lisinopril] Cough    Patient Measurements: Height: 6' (182.9 cm) Weight: 83.9 kg (185 lb) IBW/kg (Calculated) : 77.6  Vital Signs: Temp: 98.4 F (36.9 C) (03/02 0050) Temp Source: Oral (03/02 0050) BP: 103/47 (03/01 2215) Pulse Rate: 60 (03/01 2315)  Labs: Recent Labs    03/09/23 2048  HGB 9.6*  HCT 31.5*  PLT 175  APTT 49*  LABPROT 19.9*  INR 1.7*  CREATININE 1.08    Estimated Creatinine Clearance: 54.9 mL/min (by C-G formula based on SCr of 1.08 mg/dL).   Medical History: Past Medical History:  Diagnosis Date   Acquired dilation of ascending aorta and aortic root (HCC)    43mm aortic root and 41mm ascending aorta on echo 03/2020   Anemia    Arthritis    Bladder stones    Borderline diabetes    BPH (benign prostatic hyperplasia)    CAD (coronary artery disease) 12/19/2020   Non-obstructive coronary artery disease at cath in 2019   Complication of anesthesia    problem with voiding after anesthesia,   Coronary artery disease    cardiologist-  dr Rockey Situ gerhart NP--- per cath 06-02-2010 non-obstructive cad pLAD 30-40%   CVA (cerebral vascular accident) (HCC) 10/24/2016   Diverticulosis of colon    Dysrhythmia    afib   Gout    Heart murmur    History of adenomatous polyp of colon    2002-- tubular adenoma   History of aortic insufficiency    severe -- s/p  AVR 08-03-2010   History of small bowel obstruction    02/ 2007 mechanical sbo s/p  surgical intervention;  partial sbo 09/ 2011 and 03-20-2011 resolved without surgical intervention   History of urinary retention    HTN (hypertension)    Lower extremity edema    Other secondary  pulmonary hypertension (HCC) 03/20/2022   TTE 03/19/22: EF 65-70, no RWMA, moderate LVH, normal RVSF, severe pulmonary hypertension (RVSP 60.4), severe BAE, normal structure and function of mitral valve repair (mean gradient 9), mild TR, trivial AI, normal structure and function of AVR (mean 15.4), moderate PI, aortic root and ascending aorta 40 mm, RAP 15   Peripheral neuropathy    Persistent atrial fibrillation (HCC)    Pre-diabetes    S/P aortic valve replacement with prosthetic valve 08/03/2010   tissue valve   S/P Maze operation for atrial fibrillation 01/30/2018   Complete bilateral atrial lesion set using bipolar radiofrequency and cryothermy with clipping of LA appendage   S/P MVR (mitral valve repair) 01/30/2018   Complex valvuloplasty including artificial Gore-tex neochord placement x4 and Carbo medics Annuloflex ring annuloplasty, size 28   S/P patent foramen ovale closure 08/03/2010   at same time AVR   S/P tricuspid valve repair 01/30/2018   Using an MC3 Annuloplasty ring, size 28   Stroke (HCC)    Thrombocytopenia (HCC)    Tricuspid regurgitation     Medications:  PTA: Coumadin 5mg  daily except 7.5mg  on Mondays- LD 03/06/23  Assessment: 88 yo M with PAF.  Hx bioprosthetic AVR.   He currently takes 5mg  daily except 7.5mg  on Mondays.  He has been on this regimen >1 year with mostly therapeutic  INR at Coumadin clinic follow-ups.  INR 1.7- slightly low on admission, however has missed 3 past doses due to acute illness +N/V with decreased oral intake.   Pt now started on broad-spectrum antibiotics with can increase INR.  Goal of Therapy:  INR 2-3   Plan:  Coumadin 5mg  po x1 today Daily INR Monitor for s/sx of bleeding, oral intake  Junita Push PharmD 03/10/2023,1:28 AM

## 2023-03-10 NOTE — Progress Notes (Signed)
  Progress Note   Patient: [name]. ZOX:[id number] DOB: 08-04-1935 DOA: 03/09/2023     0 DOS: the patient was seen and examined on 03/10/2023   Brief hospital course: 88 year old man PMH including A-fib, valvular heart disease, recent right ureteral stone status post stent late January, indwelling Foley catheter, presented with nausea, vomiting and generalized weakness.  Admitted for catheter associated/complicated UTI.     Recently admitted 1/25-2/14 for multiple issues including Enterobacter cloacae UTI/bacteremia, right ureteral calculus s/p stenting on 1/30, ileus, AKI, anemia and thrombocytopenia (received 2 units platelets, 1 unit FFP, 4 units PRBCs), and small acute left frontal and left temporal white matter infarcts thought to be related to FFP/platelet infusion.  MRA showed severe stenosis of right P2 PCA.  Consultants   Procedures/Events   Assessment and Plan: UTI associated with chronic indwelling Foley catheter Right ureteral stone s/p stent placement 02/07/2023 BPH From SNF.   CT showed malpositioned Foley catheter, resolution of prior right hydronephrosis s/p ureteral stent placement and migration of 7 mm stone now in the proximal ureter. Foley catheter exchanged in the emergency department. Continue empiric antibiotics.  Follow-up blood and urine cultures. Continue Proscar and Flomax. Has cystoscopy scheduled with Dr. Annabell Howells 3/4 will notify office tomorrow   Permanent atrial fibrillation S/p maze 2020 S/p bioprosthetic AVR, MV and TV repair: Remains in atrial fibrillation.  Intermittent mild bradycardic episodes.  INR is 1.7. Holding atenolol given bradycardia Warfarin per pharmacy   Chronic HFpEF with pulmonary HTN: Not on diuretics as an outpatient.  Appears stable.  Holding beta-blocker as above.   Recent CVA Continue rosuvastatin and Coumadin per pharmacy.   Anemia of chronic disease: Hemoglobin stable at 9.6.   Hypertension Borderline low BP.   Holding atenolol and amlodipine.     Subjective:  Feels better but still weak.  Physical Exam: Vitals:   03/10/23 0427 03/10/23 0700 03/10/23 0814 03/10/23 1045  BP: 136/68 (!) 132/47 (!) 126/53   Pulse:  60 66   Resp: (!) 26 18 19    Temp:   (!) 100.8 F (38.2 C) 98.6 F (37 C)  TempSrc:   Oral Oral  SpO2:  94% 96%   Weight:      Height:       Physical Exam Vitals reviewed.  Constitutional:      General: He is not in acute distress.    Appearance: He is not ill-appearing or toxic-appearing.  Cardiovascular:     Rate and Rhythm: Normal rate and regular rhythm.     Heart sounds: No murmur heard. Pulmonary:     Effort: Pulmonary effort is normal. No respiratory distress.     Breath sounds: No wheezing, rhonchi or rales.  Neurological:     Mental Status: He is alert.  Psychiatric:        Mood and Affect: Mood normal.        Behavior: Behavior normal.     Data Reviewed: Creatinine stable, within normal limits.  Remainder BMP unremarkable. WBC without significant change 12.3 Hemoglobin down to 7.9 with dilution.  Baseline appears to be around 9. Platelets slightly low at 146. INR slightly low at 1.7. Flu, COVID, RSV negative. Urine culture pending.  Family Communication: None  Disposition: Status is: Observation      Time spent: 25 minutes  Author: Brendia Sacks, MD 03/10/2023 3:40 PM  For on call review [email or web address].

## 2023-03-10 NOTE — H&P (Incomplete)
 History and Physical    [name]. ZOX:[id number] DOB: 1935-09-24 DOA: 03/09/2023  PCP: Jarrett Soho, PA-C  Patient coming from: ***  I have personally briefly reviewed patient's old medical records in [facility]  Chief Complaint: ***  HPI: [name]. is a 88 y.o. male with medical history significant for permanent atrial fibrillation s/p maze on Coumadin, valvular heart disease (s/p bioprosthetic AVR, MV and TV repair), severe pulmonary HTN, nonobstructive CAD, HFpEF (EF 60-65%), recent CVA, anemia of chronic disease, right ureteral stone s/p stent (02/07/2023), indwelling Foley catheter, HTN, right renal hemorrhagic cyst who presented from SN[facility] for evaluation of nausea, vomiting, fever, and generalized weakness.***  ***  ED Course  Labs/Imaging on admission: I have personally reviewed following labs and imaging studies.  Initial vitals showed BP 147/54, pulse 66, RR 27, temp 100.0 F, SpO2 94% on room air.  Labs showed WBC 13.7, hemoglobin 9.6, platelets 175,000, sodium 141, potassium 3.7, bicarb 22, BUN 18, creatinine 1.08, serum glucose 182, INR 1.7, lipase 32, lactic acid 1.9.  UA with negative nitrites, large leukocytes, >50 WBCs and RBCs, many bacteria.  Blood and urine cultures in process.  COVID, influenza, RSV PCR negative.  CT chest/abdomen/pelvis with contrast IMPRESSION: 1. Trace right pleural effusion. 2. Foley catheter tip terminating within the urinary bladder lumen and Foley catheter inflated balloon terminating within the prostatic urethra. Recommend replacement. 3. Slightly increased size of an indeterminate heterogeneous right 8.5 x 8 cm renal lesion likely representing a hemorrhagic cyst with underlying malignancy not excluded. Indeterminate right superior renal pole lesion measuring 2.7 cm. When the patient is clinically stable and able to follow directions and hold their breath (preferably as an outpatient) further evaluation with  dedicated MRI renal protocol should be considered. 4. Interval resolution of right hydronephrosis status post right ureteral stent placement and trace distal migration of a 7 mm calcified stone now in the proximal ureter just distal to the right ureteral pelvic junction. 5. Nonobstructive right nephrolithiasis measuring up to 9 mm. 6. Prostatomegaly. 7. Colonic diverticulosis with no acute diverticulitis. 8.  Aortic Atherosclerosis (ICD10-I70.0).  Patient was given 500 cc normal saline, IV meropenem.  The hospitalist service was consulted to admit for further evaluation and management.  Review of Systems: All systems reviewed and are negative except as documented in history of present illness above.   Past Medical History:  Diagnosis Date  . Acquired dilation of ascending aorta and aortic root (HCC)    43mm aortic root and 41mm ascending aorta on echo 03/2020  . Anemia   . Arthritis   . Bladder stones   . Borderline diabetes   . BPH (benign prostatic hyperplasia)   . CAD (coronary artery disease) 12/19/2020   Non-obstructive coronary artery disease at cath in 2019  . Complication of anesthesia    problem with voiding after anesthesia,  . Coronary artery disease    cardiologist-  dr Rockey Situ gerhart NP--- per cath 06-02-2010 non-obstructive cad pLAD 30-40%  . CVA (cerebral vascular accident) (HCC) 10/24/2016  . Diverticulosis of colon   . Dysrhythmia    afib  . Gout   . Heart murmur   . History of adenomatous polyp of colon    2002-- tubular adenoma  . History of aortic insufficiency    severe -- s/p  AVR 08-03-2010  . History of small bowel obstruction    02/ 2007 mechanical sbo s/p  surgical intervention;  partial sbo 09/ 2011 and 03-20-2011 resolved  without surgical intervention  . History of urinary retention   . HTN (hypertension)   . Lower extremity edema   . Other secondary pulmonary hypertension (HCC) 03/20/2022   TTE 03/19/22: EF 65-70, no RWMA, moderate LVH,  normal RVSF, severe pulmonary hypertension (RVSP 60.4), severe BAE, normal structure and function of mitral valve repair (mean gradient 9), mild TR, trivial AI, normal structure and function of AVR (mean 15.4), moderate PI, aortic root and ascending aorta 40 mm, RAP 15  . Peripheral neuropathy   . Persistent atrial fibrillation (HCC)   . Pre-diabetes   . S/P aortic valve replacement with prosthetic valve 08/03/2010   tissue valve  . S/P Maze operation for atrial fibrillation 01/30/2018   Complete bilateral atrial lesion set using bipolar radiofrequency and cryothermy with clipping of LA appendage  . S/P MVR (mitral valve repair) 01/30/2018   Complex valvuloplasty including artificial Gore-tex neochord placement x4 and [employer] medics Annuloflex ring annuloplasty, size 28  . S/P patent foramen ovale closure 08/03/2010   at same time AVR  . S/P tricuspid valve repair 01/30/2018   Using an MC3 Annuloplasty ring, size 28  . Stroke (HCC)   . Thrombocytopenia (HCC)   . Tricuspid regurgitation     Past Surgical History:  Procedure Laterality Date  . BIOPSY  09/07/2018   Procedure: BIOPSY;  Surgeon: Kathi Der, MD;  Location: [facility];  Service: Gastroenterology;;  . CARDIAC CATHETERIZATION  06-02-2010  dr skains   non-obstructive cad- pLAD 30-40%/  normal LVSF/  severe AI  . CARDIOVASCULAR STRESS TEST  04/12/2016   Low risk nuclear perfusion study w/ no significant reversible ischemia/  normal LV function and wall motion ,  stress ef 60%/  2mm inferior and lateral scooped ST-segment depression w/ exercise (may be repolarization abnormality), exercise capacity was moderately reduced  . CATARACT EXTRACTION W/ INTRAOCULAR LENS  IMPLANT, BILATERAL  02/2010  . CLIPPING OF ATRIAL APPENDAGE N/A 01/30/2018   Procedure: CLIPPING OF LEFT ATRIAL APPENDAGE USING ATRICLIP PRO2 ;  Surgeon: Purcell Nails, MD;  Location: [facility] OR;  Service: Open Heart Surgery;  Laterality: N/A;  . COLONOSCOPY WITH  PROPOFOL N/A 10/21/2018   Procedure: COLONOSCOPY WITH PROPOFOL;  Surgeon: Kathi Der, MD;  Location: [facility];  Service: Gastroenterology;  Laterality: N/A;  . CYSTOSCOPY W/ URETERAL STENT PLACEMENT Right 02/07/2023   Procedure: CYSTOSCOPY WITH RETROGRADE PYELOGRAM/URETERAL STENT PLACEMENT;  Surgeon: Crist Fat, MD;  Location: [facility];  Service: Urology;  Laterality: Right;  . CYSTOSCOPY WITH LITHOLAPAXY N/A 06/05/2016   Procedure: CYSTOSCOPY WITH LITHOLAPAXY and fulgarization of bladder neck;  Surgeon: Bjorn Pippin, MD;  Location: [facility];  Service: Urology;  Laterality: N/A;  . ESOPHAGOGASTRODUODENOSCOPY (EGD) WITH PROPOFOL N/A 09/07/2018   Procedure: ESOPHAGOGASTRODUODENOSCOPY (EGD) WITH PROPOFOL;  Surgeon: Kathi Der, MD;  Location: [facility];  Service: Gastroenterology;  Laterality: N/A;  . EXPLORATORY LAPARTOMY /  CHOLECYSTECTOMY  02/28/2005   for Small  bowel obstruction (mechnical)  . IR ANGIO EXTRACRAN SEL COM CAROTID INNOMINATE UNI L MOD SED  10/24/2016  . IR ANGIO VERTEBRAL SEL SUBCLAVIAN INNOMINATE BILAT MOD SED  10/24/2016  . IR PERCUTANEOUS ART THROMBECTOMY/INFUSION INTRACRANIAL INC DIAG ANGIO  10/24/2016  . IR RADIOLOGIST EVAL & MGMT  12/05/2016  . LEFT KNEE ARTHROSCOPY  2006  . MAZE N/A 01/30/2018   Procedure: MAZE;  Surgeon: Purcell Nails, MD;  Location: [facility] OR;  Service: Open Heart Surgery;  Laterality: N/A;  . MITRAL VALVE REPAIR N/A 01/30/2018   Procedure:  MITRAL VALVE REPAIR (MVR) USING CARBOMEDICS ANNULOFLEX SIZE 28;  Surgeon: Purcell Nails, MD;  Location[facility] OR;  Service: Open Heart Surgery;  Laterality: N/A;  . POLYPECTOMY  10/21/2018   Procedure: POLYPECTOMY;  Surgeon: Kathi Der, MD;  Location: [facility];  Service: Gastroenterology;;  . RADIOLOGY WITH ANESTHESIA N/A 10/24/2016   Procedure: RADIOLOGY WITH ANESTHESIA;  Surgeon: Julieanne Cotton, MD;  Location: [facility] OR;  Service: Radiology;  Laterality: N/A;  .  RIGHT FOOT SURGERY    . RIGHT MINIATURE ANTERIOR THORACOTOMY FOR AORTIC VALVE REPLACEMENT AND CLOSURE PATENT FORAMEN OVALE  08-03-2010  DR Neldon Labella Magna-ease pericardial tissue valve (25mm)  . RIGHT/LEFT HEART CATH AND CORONARY ANGIOGRAPHY N/A 10/14/2017   Procedure: RIGHT/LEFT HEART CATH AND CORONARY ANGIOGRAPHY;  Surgeon: Tonny Bollman, MD;  Location: [facility] INVASIVE CV LAB;  Service: Cardiovascular;  Laterality: N/A;  . TEE WITHOUT CARDIOVERSION N/A 10/14/2017   Procedure: TRANSESOPHAGEAL ECHOCARDIOGRAM (TEE);  Surgeon: Wendall Stade, MD;  Location: [facility] ENDOSCOPY;  Service: Cardiovascular;  Laterality: N/A;  . TOTAL HIP ARTHROPLASTY Left 08/02/2020   Procedure: TOTAL HIP ARTHROPLASTY ANTERIOR APPROACH;  Surgeon: Durene Romans, MD;  Location: [facility];  Service: Orthopedics;  Laterality: Left;  . TOTAL KNEE ARTHROPLASTY Left 09/19/2021   Procedure: TOTAL KNEE ARTHROPLASTY;  Surgeon: Durene Romans, MD;  Location: [facility];  Service: Orthopedics;  Laterality: Left;  . TOTAL KNEE ARTHROPLASTY Right 08/09/2022   Procedure: TOTAL KNEE ARTHROPLASTY;  Surgeon: Durene Romans, MD;  Location: [facility];  Service: Orthopedics;  Laterality: Right;  90  . TRANSTHORACIC ECHOCARDIOGRAM  05/30/2016  dr skains   moderate  LVH ef 60-65%/  bioprothesis aortic valve present ,normal grandient and no AI /  mild MV calcification , moderate MR /  mild PR/ moderate TR/  PASP 28mmHg/ (RA denisty was identified 04-27-2016 echo) and is seen again today, this is likely a promient eustacian ridge, atrium is normal size  . TRICUSPID VALVE REPLACEMENT N/A 01/30/2018   Procedure: TRICUSPID VALVE REPAIR USING MC3 SIZE 28;  Surgeon: Purcell Nails, MD;  Location: [facility] OR;  Service: Open Heart Surgery;  Laterality: N/A;    Social History:  reports that he has never smoked. He has never used smokeless tobacco. He reports current alcohol use. He reports that he does not use drugs.  Allergies  Allergen Reactions  . Sulfa Antibiotics  Other (See Comments)    Granulocytosis  . Sulfamethoxazole-Trimethoprim     Listed on MAR  . Zestril [Lisinopril] Cough    Family History  Problem Relation Age of Onset  . Heart disease Mother   . Brain cancer Father      Prior to Admission medications   Medication Sig Start Date End Date Taking? Authorizing Provider  allopurinol (ZYLOPRIM) 300 MG tablet Take 150 mg by mouth every morning.    [provider]  amLODipine (NORVASC) 10 MG tablet TAKE 1 TABLET BY MOUTH DAILY 07/17/22   Jake Bathe, MD  atenolol (TENORMIN) 25 MG tablet TAKE ONE-HALF TABLET BY MOUTH  DAILY 09/20/22   Jake Bathe, MD  cholecalciferol (VITAMIN D3) 25 MCG (1000 UNIT) tablet Take 1,000 Units by mouth every evening.    [provider]  Cinnamon 500 MG capsule Take 500 mg by mouth daily.    [provider]  ferrous sulfate 325 (65 FE) MG EC tablet Take 325 mg by mouth 2 (two) times daily. 10/29/19   [provider]  finasteride (PROSCAR) 5 MG tablet Take 1 tablet (  5 mg total) by mouth daily. 11/05/16   Angiulli, Mcarthur Rossetti, PA-C  magnesium hydroxide (MILK OF MAGNESIA) 400 MG/5ML suspension Take 30 mLs by mouth daily as needed for mild constipation.    [provider]  Multiple Vitamins-Minerals (CENTRUM SILVER ADULT 50+) TABS Take 1 tablet by mouth daily.    [provider]  polyethylene glycol (MIRALAX / GLYCOLAX) 17 g packet Take 17 g by mouth daily.    [provider]  rosuvastatin (CRESTOR) 10 MG tablet TAKE 1 TABLET BY MOUTH DAILY 07/17/22   Jake Bathe, MD  senna-docusate (SENOKOT-S) 8.6-50 MG tablet Take 2 tablets by mouth daily.    [provider]  silodosin (RAPAFLO) 8 MG CAPS capsule Take 8 mg by mouth daily. 11/02/21   [provider]  traZODone (DESYREL) 50 MG tablet Take 25 mg by mouth at bedtime as needed for sleep.    [provider]  warfarin (COUMADIN) 5 MG tablet TAKE 1 TO 1 AND 1/2 TABLETS BY  MOUTH DAILY  OR AS DIRECTED BY  ANTICOAGULATION CLINIC Patient taking differently: Take 5 mg by mouth every evening. 03/04/23   Jake Bathe, MD    Physical Exam: Vitals:   03/09/23 2200 03/09/23 2215 03/09/23 2300 03/09/23 2315  BP: (!) 111/55 (!) 103/47    Pulse: (!) 51 69 (!) 51 60  Resp: (!) 30 (!) 21 (!) 26 (!) 22  Temp:      TempSrc:      SpO2: 93% 93% 91% 92%  Weight:      Height:       *** Constitutional: NAD, calm, comfortable Eyes: PERRL, lids and conjunctivae normal ENMT: Mucous membranes are moist. Posterior pharynx clear of any exudate or lesions.Normal dentition.  Neck: normal, supple, no masses. Respiratory: clear to auscultation bilaterally, no wheezing, no crackles. Normal respiratory effort. No accessory muscle use.  Cardiovascular: Regular rate and rhythm, no murmurs / rubs / gallops. No extremity edema. 2+ pedal pulses. Abdomen: no tenderness, no masses palpated. No hepatosplenomegaly. Bowel sounds positive.  Musculoskeletal: no clubbing / cyanosis. No joint deformity upper and lower extremities. Good ROM, no contractures. Normal muscle tone.  Skin: no rashes, lesions, ulcers. No induration Neurologic: CN 2-12 grossly intact. Sensation intact. Strength 5/5 in all 4.  Psychiatric: Normal judgment and insight. Alert and oriented x 3. Normal mood.   EKG: Personally reviewed. Atrial fibrillation, rate 66, prolonged QTc.  Rate is slower when compared to previous.  Assessment/Plan Active Problems:   * No active hospital problems. *   *** No notes on file *** Assessment and Plan: No notes have been filed under this hospital service. Service: Hospitalist  UTI associated with chronic indwelling Foley catheter Recent Enterobacter cloacae UTI/bacteremia: ***  Permanent atrial fibrillation S/p maze 2020 S/p bioprosthetic AVR, MV and TV repair: ***  Chronic HFpEF with pulmonary HTN: ***  History of CVA: ***  Anemia of chronic disease: ***  Hypertension: ***     DVT prophylaxis: ***  Code Status: ***  Family Communication: ***  Disposition Plan: ***  Consults called: ***  Severity of Illness: {Observation/Inpatient:21159}  Darreld Mclean MD [facility]  If 7PM-7AM, please contact night-coverage [email or web address]  03/09/2023, 11:47 PM

## 2023-03-10 NOTE — Progress Notes (Signed)
 Pharmacy Antibiotic Note  [name]. is a 88 y.o. male admitted on 03/09/2023 with UTI.  Pharmacy has been consulted for Meropenem dosing.  -He was recently admitted 1/25-2/14 for multiple issues including Enterobacter cloacae UTI/bacteremia, right ureteral calculus s/p stenting on 1/30.  Cefepime switched to Meropenem during this admission due to Cefepime induced encephalopathy.  -Scr 1.08- slightly above patient's baseline -Chronic indwelling foley catheter  Plan: Meropenem 1gm IV q8h Monitor renal function and cx data   Height: 6' (182.9 cm) Weight: 83.9 kg (185 lb) IBW/kg (Calculated) : 77.6  Temp (24hrs), Avg:99.2 F (37.3 C), Min:98.4 F (36.9 C), Max:100 F (37.8 C)  Recent Labs  Lab 03/09/23 2048 03/09/23 2049  WBC 13.7*  --   CREATININE 1.08  --   LATICACIDVEN  --  1.9    Estimated Creatinine Clearance: 54.9 mL/min (by C-G formula based on SCr of 1.08 mg/dL).    Allergies  Allergen Reactions   Sulfa Antibiotics Other (See Comments)    Granulocytosis   Cefepime Other (See Comments)    encephalopathy   Sulfamethoxazole-Trimethoprim     Listed on MAR   Zestril [Lisinopril] Cough    Antimicrobials this admission: 3/2 Meropenem >>   Dose adjustments this admission:  Microbiology results: 3/1 BCx:  3/1 UCx:   3/1 Resp PCR: negative  Thank you for allowing pharmacy to be a part of this patient's care.  Junita Push PharmD 03/10/2023 1:20 AM

## 2023-03-10 NOTE — Progress Notes (Addendum)
 PHARMACY - PHYSICIAN COMMUNICATION CRITICAL VALUE ALERT - BLOOD CULTURE IDENTIFICATION (BCID)  [name]. is an 87 y.o. male who presented to [facility] on 03/09/2023 with a chief complaint of possible CAUTI  Assessment:  Ecoli bacteremia  Name of physician (or Provider) Contacted: Irene Limbo   Current antibiotics: meropenem   Changes to prescribed antibiotics recommended:  Change meropenem to Rocephin 2g IV q24   Results for orders placed or performed during the hospital encounter of 03/09/23  Blood Culture ID Panel (Reflexed) (Collected: 03/09/2023  8:45 PM)  Result Value Ref Range   Enterococcus faecalis NOT DETECTED NOT DETECTED   Enterococcus Faecium NOT DETECTED NOT DETECTED   Listeria monocytogenes NOT DETECTED NOT DETECTED   Staphylococcus species NOT DETECTED NOT DETECTED   Staphylococcus aureus (BCID) NOT DETECTED NOT DETECTED   Staphylococcus epidermidis NOT DETECTED NOT DETECTED   Staphylococcus lugdunensis NOT DETECTED NOT DETECTED   Streptococcus species NOT DETECTED NOT DETECTED   Streptococcus agalactiae NOT DETECTED NOT DETECTED   Streptococcus pneumoniae NOT DETECTED NOT DETECTED   Streptococcus pyogenes NOT DETECTED NOT DETECTED   A.calcoaceticus-baumannii NOT DETECTED NOT DETECTED   Bacteroides fragilis NOT DETECTED NOT DETECTED   Enterobacterales DETECTED (A) NOT DETECTED   Enterobacter cloacae complex NOT DETECTED NOT DETECTED   Escherichia coli DETECTED (A) NOT DETECTED   Klebsiella aerogenes NOT DETECTED NOT DETECTED   Klebsiella oxytoca NOT DETECTED NOT DETECTED   Klebsiella pneumoniae NOT DETECTED NOT DETECTED   Proteus species NOT DETECTED NOT DETECTED   Salmonella species NOT DETECTED NOT DETECTED   Serratia marcescens NOT DETECTED NOT DETECTED   Haemophilus influenzae NOT DETECTED NOT DETECTED   Neisseria meningitidis NOT DETECTED NOT DETECTED   Pseudomonas aeruginosa NOT DETECTED NOT DETECTED   Stenotrophomonas maltophilia NOT DETECTED  NOT DETECTED   Candida albicans NOT DETECTED NOT DETECTED   Candida auris NOT DETECTED NOT DETECTED   Candida glabrata NOT DETECTED NOT DETECTED   Candida krusei NOT DETECTED NOT DETECTED   Candida parapsilosis NOT DETECTED NOT DETECTED   Candida tropicalis NOT DETECTED NOT DETECTED   Cryptococcus neoformans/gattii NOT DETECTED NOT DETECTED   CTX-M ESBL NOT DETECTED NOT DETECTED   Carbapenem resistance IMP NOT DETECTED NOT DETECTED   Carbapenem resistance KPC NOT DETECTED NOT DETECTED   Carbapenem resistance NDM NOT DETECTED NOT DETECTED   Carbapenem resist OXA 48 LIKE NOT DETECTED NOT DETECTED   Carbapenem resistance VIM NOT DETECTED NOT DETECTED    Berkley Harvey 03/10/2023  5:38 PM

## 2023-03-10 NOTE — Hospital Course (Addendum)
 88 year old man PMH including A-fib, valvular heart disease, recent right ureteral stone status post stent late January, indwelling Foley catheter, presented with nausea, vomiting and generalized weakness.  Admitted for sepsis, bacteremia, catheter associated UTI.  Treated with empiric antibiotics with gradual clinical improvement.  Underwent cystoscopy with stone removal and stent exchange, Foley catheter removed.  If does well over the next 24 hours would anticipate discharge.   Recently admitted 1/25-2/14 for multiple issues including Enterobacter cloacae UTI/bacteremia, right ureteral calculus s/p stenting on 1/30, ileus, AKI, anemia and thrombocytopenia (received 2 units platelets, 1 unit FFP, 4 units PRBCs), and small acute left frontal and left temporal white matter infarcts thought to be related to FFP/platelet infusion.  MRA showed severe stenosis of right P2 PCA.  Consultants Urology   Procedures/Events 3/4 1.  Cystoscopy with right ureteroscopy with holmium laser application, stone extraction and stent exchange for right proximal stone. 2.  Right ureteroscopy with holmium laser application and stone extraction for right renal stone. 3.  Application of fluoroscopy.

## 2023-03-10 NOTE — Care Management Obs Status (Signed)
 MEDICARE OBSERVATION STATUS NOTIFICATION   Patient Details  Name: [name]. MRN: [id number] Date of Birth: Apr 09, 1935   Medicare Observation Status Notification Given:  Yes    Adrian Prows, RN 03/10/2023, 12:14 PM

## 2023-03-10 NOTE — TOC Initial Note (Signed)
 Transition of Care (TOC) - Initial/Assessment Note    Patient Details  Name: [name]. MRN: [id number] Date of Birth: 03/03/1935  Transition of Care [facility]) CM/SW Contact:    Adrian Prows, RN Phone Number: 03/10/2023, 12:31 PM  Clinical Narrative:                 Sherron Monday w/ pt in room; pt says he lives at home w/ his wife; he plans to return at d/c; pt identified POC Damin Salido (son) [phone]; pt says he has transportation; pt verified Insurance/PCP; he denied SDOH risks; pt says he has a cane, walker, and shower chair; he has HH aide w/ [facility]; Cory at [place] notified; [facility] will follow.  Expected Discharge Plan: Home w Home Health Services Barriers to Discharge: Continued Medical Work up   Patient Goals and CMS Choice Patient states their goals for this hospitalization and ongoing recovery are:: home CMS Medicare.gov Compare Post Acute Care list provided to:: Patient        Expected Discharge Plan and Services   Discharge Planning Services: CM Consult   Living arrangements for the past 2 months: Single Family Home                                      Prior Living Arrangements/Services Living arrangements for the past 2 months: Single Family Home Lives with:: Spouse Patient language and need for interpreter reviewed:: Yes Do you feel safe going back to the place where you live?: Yes      Need for Family Participation in Patient Care: Yes (Comment) Care giver support system in place?: Yes (comment) Current home services: DME Criminal Activity/Legal Involvement Pertinent to Current Situation/Hospitalization: No - Comment as needed  Activities of Daily Living   ADL Screening (condition at time of admission) Independently performs ADLs?: No Does the patient have a NEW difficulty with bathing/dressing/toileting/self-feeding that is expected to last >3 days?: Yes (Initiates electronic notice to provider for possible OT consult) Does the  patient have a NEW difficulty with getting in/out of bed, walking, or climbing stairs that is expected to last >3 days?: Yes (Initiates electronic notice to provider for possible PT consult) Does the patient have a NEW difficulty with communication that is expected to last >3 days?: No Is the patient deaf or have difficulty hearing?: No Does the patient have difficulty seeing, even when wearing glasses/contacts?: No Does the patient have difficulty concentrating, remembering, or making decisions?: No  Permission Sought/Granted Permission sought to share information with : Case Manager Permission granted to share information with : Yes, Verbal Permission Granted  Share Information with NAME: Case Manager     Permission granted to share info w Relationship: Damyon Mullane (son) [phone]     Emotional Assessment Appearance:: Appears stated age Attitude/Demeanor/Rapport: Gracious Affect (typically observed): Accepting Orientation: : Oriented to Self, Oriented to Place, Oriented to  Time, Oriented to Situation Alcohol / Substance Use: Not Applicable Psych Involvement: No (comment)  Admission diagnosis:  Urinary tract infection without hematuria, site unspecified [N39.0] Urinary tract infection associated with indwelling urethral catheter (HCC) [U13.244W, N39.0] Sepsis, due to unspecified organism, unspecified whether acute organ dysfunction present [facility]) [A41.9] Patient Active Problem List   Diagnosis Date Noted   Urinary tract infection associated with indwelling urethral catheter (HCC) 03/10/2023   Ureteral calculus, right 02/06/2023   Nephrolithiasis 02/06/2023   Sepsis due to Enterobacter (HCC) 02/06/2023  Renal cyst 02/06/2023   Aspiration pneumonia (HCC) 02/04/2023   DNR (do not resuscitate) 02/04/2023   Acute respiratory failure with hypoxia (HCC) 02/02/2023   Elevated troponin 02/02/2023   Volume overload 02/02/2023   S/P total knee arthroplasty, right 08/09/2022    Gait abnormality 06/28/2022   Bilateral foot-drop 06/28/2022   Preoperative cardiovascular examination 05/30/2022   Other secondary pulmonary hypertension (HCC) 03/20/2022   COVID-19 virus infection 11/11/2021   Weakness 11/08/2021   Urinary catheter in place 11/08/2021   Hematuria 09/25/2021   Anemia of chronic disease 09/23/2021   HLD (hyperlipidemia) 09/23/2021   S/P total knee arthroplasty, left 09/19/2021   Abnormal findings on diagnostic imaging of other specified body structures 06/02/2021   Hypoalbuminemia due to protein-calorie malnutrition (HCC) 05/29/2021   Valvular heart disease 05/28/2021   Cerebrovascular accident (CVA) (HCC) 05/21/2021   SIRS (systemic inflammatory response syndrome) (HCC) 05/21/2021   Generalized weakness 05/21/2021   Otitis externa 05/21/2021   CAD (coronary artery disease) 12/19/2020   Protein-calorie malnutrition, moderate (HCC) 08/02/2020   S/P left total hip arthroplasty 08/02/2020   Closed left hip fracture, initial encounter (HCC) 08/01/2020   Chronic congestive heart failure (HCC) 05/26/2020   Elevated PSA 05/26/2020   History of adenomatous polyp of colon 05/26/2020   History of anemia 05/26/2020   Hypercoagulable state (HCC) 05/26/2020   Impairment of balance 05/26/2020   Peripheral neuropathy 05/26/2020   Personal history of other malignant neoplasm of skin 05/26/2020   Prediabetes 05/26/2020   Presence of prosthetic heart valve 05/26/2020   Sleep disorder 05/26/2020   Thrombocytopenia (HCC) 05/26/2020   Vitamin D deficiency 05/26/2020   Acquired dilation of ascending aorta and aortic root (HCC)    Anemia 09/05/2018   Anemia due to chronic blood loss 09/05/2018   Long term current use of anticoagulant 02/10/2018   s/p mitral valve repair 01/30/2018   S/P tricuspid valve repair 01/30/2018   S/P Maze operation for atrial fibrillation 01/30/2018   Tricuspid regurgitation    Mitral valve regurgitation 10/14/2017   Permanent atrial  fibrillation (HCC)    Hyperglycemia    History of ischemic right MCA stroke 10/29/2016   Hypokalemia    Left hemiparesis (HCC)    Dysphagia, post-stroke    S/P AVR (aortic valve replacement)    Benign prostatic hyperplasia with lower urinary tract symptoms    Essential hypertension    Gout    History of small bowel obstruction    History of BPH    S/P AVR 08/03/2010   Hypertrophy of prostate with urinary obstruction and other lower urinary tract symptoms (LUTS) 05/21/2001   PCP:  Jarrett Soho, PA-C Pharmacy:   [facility]) - [place], [place] - [address] [facility] [address] [place] [address] Phone: [phone] Fax: [phone]  [facility] [address] [place], [place] - [address] [place] [address] Phone: [phone] Fax: [phone]  [facility] - [place], [place] - [address] [place] [address] Phone: [phone] Fax: [phone]     Social Drivers of Health (SDOH) Social History: SDOH Screenings   Food Insecurity: No Food Insecurity (03/10/2023)  Housing: Low Risk  (03/10/2023)  Transportation Needs: No Transportation Needs (03/10/2023)  Utilities: Not At Risk (03/10/2023)  Alcohol Screen: Low Risk  (06/02/2021)  Depression (PHQ2-9): Low Risk  (06/16/2021)  Financial Resource Strain: Low Risk  (06/16/2021)  Social Connections: Unknown (  03/10/2023)  Stress: No Stress Concern Present (06/16/2021)  Tobacco Use: Low Risk  (03/09/2023)   SDOH Interventions: Food Insecurity Interventions: Intervention Not Indicated, Inpatient TOC Housing Interventions: Intervention Not Indicated, Inpatient TOC Transportation Interventions: Intervention Not Indicated, Inpatient TOC Utilities Interventions: Intervention Not Indicated, Inpatient TOC   Readmission Risk Interventions    02/11/2023    4:00 PM 11/10/2021    3:54 PM   Readmission Risk Prevention Plan  Transportation Screening Complete Complete  PCP or Specialist Appt within 3-5 Days Not Complete Complete  Not Complete comments Patient will discharge to SNF.   HRI or Home Care Consult Complete Complete  Social Work Consult for Recovery Care Planning/Counseling Complete Complete  Palliative Care Screening Not Applicable Not Applicable  Medication Review Oceanographer) Complete Complete

## 2023-03-10 NOTE — Plan of Care (Signed)

## 2023-03-11 DIAGNOSIS — R7881 Bacteremia: Secondary | ICD-10-CM

## 2023-03-11 DIAGNOSIS — Z8673 Personal history of transient ischemic attack (TIA), and cerebral infarction without residual deficits: Secondary | ICD-10-CM | POA: Diagnosis not present

## 2023-03-11 DIAGNOSIS — T83511A Infection and inflammatory reaction due to indwelling urethral catheter, initial encounter: Secondary | ICD-10-CM | POA: Diagnosis not present

## 2023-03-11 DIAGNOSIS — A419 Sepsis, unspecified organism: Secondary | ICD-10-CM

## 2023-03-11 DIAGNOSIS — I4821 Permanent atrial fibrillation: Secondary | ICD-10-CM | POA: Diagnosis not present

## 2023-03-11 HISTORY — DX: Sepsis, unspecified organism: A41.9

## 2023-03-11 MED ORDER — WARFARIN SODIUM 5 MG PO TABS: 7.5000 mg | ORAL_TABLET | Freq: Once | ORAL | Status: DC

## 2023-03-11 NOTE — Evaluation (Signed)
 Occupational Therapy Evaluation Patient Details Name: [name]. MRN: [id number] DOB: November 18, 1935 [name] Date: 03/11/2023   History of Present Illness    Pt is an 88 y.o. male with dx of UTI associated with chronic indwelling Foley catheter. Recently admitted 1/25-2/14 for multiple issues including Enterobacter cloacae UTI/bacteremia, R ureteral calculus s/p stenting on 1/30, ileus, AKI, anemia and thrombocytopenia  PMH:CVA, L THA 9/23, CAD s/p cath, OA,gout, HTN, peripheral neuropathy, PAF s/p Maze, s/p aortic valve replacement, s/p tricuspid valve repair, hx of stroke, L-THA 2022.     Clinical Impressions PTA pt was independently caring for wife and driving until recent hospitalization as above. After [facility] d/c, pt was in rehab and recently discharged to home with [facility] services and personal care support for he and wife with cognitive deficits. Currently, pt with decreased overall strength, balance, activity tolerance and higher level problem solving skills impacting BADL's and functional mobility. Pt will benefit from Acute OT follow up to progress goals and will require support at home from family and caregivers, initial supervision and HHOT services.    If plan is discharge home, recommend the following:   A little help with walking and/or transfers;A little help with bathing/dressing/bathroom;Assistance with cooking/housework;Direct supervision/assist for medications management;Direct supervision/assist for financial management;Assist for transportation;Help with stairs or ramp for entrance;Supervision due to cognitive status     Functional Status Assessment   Patient has had a recent decline in their functional status and demonstrates the ability to make significant improvements in function in a reasonable and predictable amount of time.     Equipment Recommendations   Tub/shower bench      Precautions/Restrictions   Precautions Precautions: Fall Recall of  Precautions/Restrictions: Intact Restrictions Weight Bearing Restrictions Per Provider Order: No     Mobility Bed Mobility Overal bed mobility: Needs Assistance Bed Mobility: Rolling, Supine to Sit Rolling: Supervision Sidelying to sit: Supervision Supine to sit: Contact guard          Transfers Overall transfer level: Needs assistance Equipment used: Rolling walker (2 wheels) Transfers: Sit to/from Stand, Bed to chair/wheelchair/BSC Sit to Stand: Contact guard assist Stand pivot transfers: Min assist   Step pivot transfers: Min assist     General transfer comment: min cues for hand placement      Balance Overall balance assessment: Needs assistance Sitting-balance support: No upper extremity supported, Feet supported Sitting balance-Leahy Scale: Fair     Standing balance support: Bilateral upper extremity supported, During functional activity, Reliant on assistive device for balance Standing balance-Leahy Scale: Fair                             ADL either performed or assessed with clinical judgement   ADL Overall ADL's : Needs assistance/impaired Eating/Feeding: Set up   Grooming: Contact guard assist   Upper Body Bathing: Supervision/ safety;Sitting   Lower Body Bathing: Minimal assistance;Sit to/from stand Lower Body Bathing Details (indicate cue type and reason): EOB sitting and standing with RW Upper Body Dressing : Supervision/safety;Sitting   Lower Body Dressing: Minimal assistance Lower Body Dressing Details (indicate cue type and reason): difficulty bringing L LE for figure 4 technique but able to with close S on R Toilet Transfer: Minimal assistance;Stand-pivot;Ambulation;Grab bars   Toileting- Clothing Manipulation and Hygiene: Minimal assistance (has Foley catheter for voiding)   Tub/ Shower Transfer: Minimal assistance;Grab bars;Tub bench   Functional mobility during ADLs: Minimal assistance;Rolling walker (2 wheels) (50 ft x 2  hallway distance with Rw) General ADL Comments: + Foley for urine mamagement, decreased higher level balance, strength and activity tolerance impact BADL's     Vision Baseline Vision/History: 0 No visual deficits Ability to See in Adequate Light: 0 Adequate Patient Visual Report: No change from baseline Vision Assessment?: No apparent visual deficits     Perception Perception: Within Functional Limits       Praxis Praxis: WFL       Pertinent Vitals/Pain Pain Assessment Pain Assessment: No/denies pain     Extremity/Trunk Assessment Upper Extremity Assessment Upper Extremity Assessment: Generalized weakness;Right hand dominant   Lower Extremity Assessment Lower Extremity Assessment: Generalized weakness   Cervical / Trunk Assessment Cervical / Trunk Assessment: Kyphotic   Communication Communication Communication: No apparent difficulties   Cognition Arousal: Alert Behavior During Therapy: WFL for tasks assessed/performed Cognition: No apparent impairments             OT - Cognition Comments: pt reports very mild high level problem solving                 Following commands: Intact       Cueing  General Comments      no skin issues noted           Home Living Family/patient expects to be discharged to:: Private residence Living Arrangements: Spouse/significant other Available Help at Discharge: Family;Available 24 hours/day Type of Home: House Home Access: Stairs to enter [employer] of Steps: 2 Entrance Stairs-Rails: None (has a post to hold) Home Layout: One level     Bathroom Shower/Tub: Tub/shower unit;Curtain   Firefighter: Standard Bathroom Accessibility: Yes How Accessible: Accessible via walker Home Equipment: Rolling Walker (2 wheels);Cane - single point;Shower seat;BSC/3in1;Grab bars - tub/shower   Additional Comments: moved from [employer] [facility] now to single family home      Prior Functioning/Environment  Prior Level of Function : Independent/Modified Independent (prior to last hospitalization then at rehab to home with [facility] services)             Mobility Comments: use SPC and now RW ADLs Comments: decreased activity tolerance    OT Problem List: Decreased strength;Decreased knowledge of precautions;Decreased cognition;Decreased safety awareness;Decreased activity tolerance;Impaired UE functional use;Decreased knowledge of use of DME or AE;Cardiopulmonary status limiting activity;Decreased range of motion   OT Treatment/Interventions: Self-care/ADL training;Therapeutic exercise;Neuromuscular education;Energy conservation;DME and/or AE instruction;Therapeutic activities;Cognitive remediation/compensation;Patient/family education;Balance training      OT Goals(Current goals can be found in the care plan section)   Acute Rehab OT Goals Patient Stated Goal: to go home again with therapies OT Goal Formulation: With patient Time For Goal Achievement: 03/25/23 Potential to Achieve Goals: Good ADL Goals Pt Will Perform Lower Body Bathing: with set-up;sit to/from stand Pt Will Perform Lower Body Dressing: with supervision;sit to/from stand Pt Will Transfer to Toilet: with supervision;ambulating;regular height toilet Pt Will Perform Tub/Shower Transfer: with contact guard assist;tub bench;rolling walker;grab bars   OT Frequency:  Min 2X/week    Co-evaluation              AM-PAC OT "6 Clicks" Daily Activity     Outcome Measure Help from another person eating meals?: A Little Help from another person taking care of personal grooming?: A Little Help from another person toileting, which includes using toliet, bedpan, or urinal?: A Lot Help from another person bathing (including washing, rinsing, drying)?: A Lot Help from another person to put on and taking off regular upper body clothing?: A Little Help from another  person to put on and taking off regular lower body clothing?: A Lot 6  Click Score: 15   End of Session Equipment Utilized During Treatment: Gait belt;Rolling walker (2 wheels) Nurse Communication: Mobility status  Activity Tolerance: Patient limited by fatigue Patient left: in chair;with call bell/phone within reach;with chair alarm set  OT Visit Diagnosis: Unsteadiness on feet (R26.81);Other abnormalities of gait and mobility (R26.89);Muscle weakness (generalized) (M62.81)                Time: 1601-0932 OT Time Calculation (min): 49 min Charges:  OT General Charges $OT Visit: 1 Visit OT Evaluation $OT Eval Moderate Complexity: 1 Mod OT Treatments $Self Care/Home Management : 23-37 mins Eddi Hymes OT/L Acute Rehabilitation Department  [phone]  03/11/2023, 1:14 PM

## 2023-03-11 NOTE — Progress Notes (Addendum)
  Progress Note   Patient: [name]. OVF:[id number] DOB: 1935-08-01 DOA: 03/09/2023     1 DOS: the patient was seen and examined on 03/11/2023   Brief hospital course: 88 year old man PMH including A-fib, valvular heart disease, recent right ureteral stone status post stent late January, indwelling Foley catheter, presented with nausea, vomiting and generalized weakness.  Admitted for catheter associated/complicated UTI.     Recently admitted 1/25-2/14 for multiple issues including Enterobacter cloacae UTI/bacteremia, right ureteral calculus s/p stenting on 1/30, ileus, AKI, anemia and thrombocytopenia (received 2 units platelets, 1 unit FFP, 4 units PRBCs), and small acute left frontal and left temporal white matter infarcts thought to be related to FFP/platelet infusion.  MRA showed severe stenosis of right P2 PCA.  Consultants Urology   Procedures/Events   Assessment and Plan: Sepsis secondary to [name] associated with chronic indwelling Foley catheter E. coli bacteremia PMH right ureteral stone s/p stent placement 02/07/2023 BPH From SNF.   CT showed malpositioned Foley catheter, resolution of prior right hydronephrosis s/p ureteral stent placement and migration of 7 mm stone now in the proximal ureter. Foley catheter exchanged in the emergency department. Clinically improving.  Continue empiric antibiotics.  Follow-up blood and urine cultures. Continue Proscar and Flomax. Keep already scheduled cystoscopy with Dr. Annabell Howells 3/4 for tomorrow   Permanent atrial fibrillation S/p maze 2020 S/p bioprosthetic AVR, MV and TV repair: Remains in atrial fibrillation.  Intermittent mild bradycardic episodes.  INR is 1.7. Holding atenolol given bradycardia Holding warfarin tonight in anticipation of procedure tomorrow   Chronic HFpEF with pulmonary HTN: Not on diuretics as an outpatient.  Appears stable.  Holding beta-blocker as above.   Recent CVA Continue rosuvastatin   Resume warfarin after procedure   Anemia of chronic disease: Hemoglobin stable at 9.6.   Hypertension Borderline low BP.  Holding atenolol and amlodipine.      Subjective:  Feels better  Physical Exam: Vitals:   03/10/23 1558 03/10/23 2037 03/11/23 0606 03/11/23 1403  BP: (!) 117/51 (!) 137/49 118/65 (!) 122/56  Pulse: (!) 57 (!) 54 61 61  Resp: 17 18 16 16   Temp: 97.7 F (36.5 C) 97.7 F (36.5 C) 98.1 F (36.7 C) 98.2 F (36.8 C)  TempSrc: Oral Oral    SpO2: 96% 98% 96% 99%  Weight:   73.1 kg   Height:       Physical Exam Vitals reviewed.  Constitutional:      General: He is not in acute distress.    Appearance: He is not ill-appearing or toxic-appearing.  Cardiovascular:     Rate and Rhythm: Normal rate and regular rhythm.     Heart sounds: No murmur heard. Pulmonary:     Effort: Pulmonary effort is normal. No respiratory distress.     Breath sounds: No wheezing, rhonchi or rales.  Neurological:     Mental Status: He is alert.  Psychiatric:        Mood and Affect: Mood normal.        Behavior: Behavior normal.     Data Reviewed: BMP noted Hgb 7.9, stable after fluids Plts 146  Family Communication: none (did not want me to call sister)  Disposition: Status is: Inpatient Remains inpatient appropriate because: UTI     Time spent: 20 urology minutes  Author: Brendia Sacks, MD 03/11/2023 6:10 PM  For on call review [email or web address].

## 2023-03-11 NOTE — Plan of Care (Signed)
  Problem: Education: Goal: Knowledge of General Education information will improve Description: Including pain rating scale, medication(s)/side effects and non-pharmacologic comfort measures Outcome: Progressing   Problem: Activity: Goal: Risk for activity intolerance will decrease Outcome: Progressing   Problem: Coping: Goal: Level of anxiety will decrease Outcome: Progressing   Problem: Safety: Goal: Ability to remain free from injury will improve Outcome: Progressing   Problem: Skin Integrity: Goal: Risk for impaired skin integrity will decrease Outcome: Progressing   Problem: Nutrition: Goal: Adequate nutrition will be maintained Outcome: Not Progressing

## 2023-03-11 NOTE — Progress Notes (Signed)
 PHARMACY - ANTICOAGULATION CONSULT NOTE  Pharmacy Consult for Coumadin Indication: atrial fibrillation   Allergies  Allergen Reactions   Sulfa Antibiotics Other (See Comments)    Granulocytosis   Cefepime Other (See Comments)    encephalopathy   Sulfamethoxazole-Trimethoprim     Listed on MAR   Zestril [Lisinopril] Cough    Patient Measurements: Height: 6' (182.9 cm) Weight: 73.1 kg (161 lb 2.5 oz) IBW/kg (Calculated) : 77.6  Vital Signs: Temp: 98.1 F (36.7 C) (03/03 0606) Temp Source: Oral (03/02 2037) BP: 118/65 (03/03 0606) Pulse Rate: 61 (03/03 0606)  Labs: Recent Labs    03/09/23 2048 03/10/23 0600  HGB 9.6* 7.9*  HCT 31.5* 26.5*  PLT 175 146*  APTT 49*  --   LABPROT 19.9* 20.5*  INR 1.7* 1.7*  CREATININE 1.08 1.08    Estimated Creatinine Clearance: 51.7 mL/min (by C-G formula based on SCr of 1.08 mg/dL).   Medical History: Past Medical History:  Diagnosis Date   Acquired dilation of ascending aorta and aortic root (HCC)    43mm aortic root and 41mm ascending aorta on echo 03/2020   Anemia    Arthritis    Bladder stones    Borderline diabetes    BPH (benign prostatic hyperplasia)    CAD (coronary artery disease) 12/19/2020   Non-obstructive coronary artery disease at cath in 2019   Complication of anesthesia    problem with voiding after anesthesia,   Coronary artery disease    cardiologist-  dr Rockey Situ gerhart NP--- per cath 06-02-2010 non-obstructive cad pLAD 30-40%   CVA (cerebral vascular accident) (HCC) 10/24/2016   Diverticulosis of colon    Dysrhythmia    afib   Gout    Heart murmur    History of adenomatous polyp of colon    2002-- tubular adenoma   History of aortic insufficiency    severe -- s/p  AVR 08-03-2010   History of small bowel obstruction    02/ 2007 mechanical sbo s/p  surgical intervention;  partial sbo 09/ 2011 and 03-20-2011 resolved without surgical intervention   History of urinary retention    HTN  (hypertension)    Lower extremity edema    Other secondary pulmonary hypertension (HCC) 03/20/2022   TTE 03/19/22: EF 65-70, no RWMA, moderate LVH, normal RVSF, severe pulmonary hypertension (RVSP 60.4), severe BAE, normal structure and function of mitral valve repair (mean gradient 9), mild TR, trivial AI, normal structure and function of AVR (mean 15.4), moderate PI, aortic root and ascending aorta 40 mm, RAP 15   Peripheral neuropathy    Persistent atrial fibrillation (HCC)    Pre-diabetes    S/P aortic valve replacement with prosthetic valve 08/03/2010   tissue valve   S/P Maze operation for atrial fibrillation 01/30/2018   Complete bilateral atrial lesion set using bipolar radiofrequency and cryothermy with clipping of LA appendage   S/P MVR (mitral valve repair) 01/30/2018   Complex valvuloplasty including artificial Gore-tex neochord placement x4 and Car[employer] medics Annuloflex ring annuloplasty, size 28   S/P patent foramen ovale closure 08/03/2010   at same time AVR   S/P tricuspid valve repair 01/30/2018   Using an MC3 Annuloplasty ring, size 28   Stroke (HCC)    Thrombocytopenia (HCC)    Tricuspid regurgitation     Medications:  PTA: Coumadin 5mg  daily except 7.5mg  on Mondays- LD 03/06/23  Assessment: 88 yo M with PAF.  Hx bioprosthetic AVR.   He currently takes 5mg  daily except 7.5mg  on  Mondays.  He has been on this regimen >1 year with mostly therapeutic INR at Coumadin clinic follow-ups.  INR 1.7- slightly low on admission, however has missed 3 past doses due to acute illness +N/V with decreased oral intake.   Pt now started on broad-spectrum antibiotics with can increase INR.  Today, 03/11/23 INR 1.7, subtherapeutic Remains low due to missed doses and short time from yesterday's administration and INR level time  Hgb 7.9, plt 146  1 meal charted on 3/2  Antibiotic has been narrowed from meropenem to ceftriaxone   Goal of Therapy:  INR 2-3   Plan:  Coumadin 7.5mg  po  x1 as per home med regimen  Daily INR Monitor for s/sx of bleeding, oral intake   Adalberto Cole, PharmD, BCPS 03/11/2023 8:26 AM

## 2023-03-11 NOTE — Evaluation (Signed)
 Physical Therapy Evaluation Patient Details Name: [name]. MRN: [id number] DOB: 07/31/1935 [name] Date: 03/11/2023  History of Present Illness  88 yo male admitted with weakness. Dx: UTI, R renal stones. Hx of B TKAs, L THA, CVA, gout, peripheral neuropathy, sepsis, Afib, indwelling foley catheter. Recent d/c from SNF.  Clinical Impression  On eval, pt was CGA for mobility. He walked ~300 feet with a RW. Pt tolerated activity well. Will continue to follow and progress activity as tolerated. Recommend HHPT f/u.        If plan is discharge home, recommend the following: A little help with walking and/or transfers;A little help with bathing/dressing/bathroom;Assistance with cooking/housework;Assist for transportation;Help with stairs or ramp for entrance   Can travel by private vehicle   Yes    Equipment Recommendations None recommended by PT  Recommendations for Other Services       Functional Status Assessment Patient has had a recent decline in their functional status and demonstrates the ability to make significant improvements in function in a reasonable and predictable amount of time.     Precautions / Restrictions Precautions Precautions: Fall Restrictions Weight Bearing Restrictions Per Provider Order: No      Mobility  Bed Mobility Overal bed mobility: Needs Assistance Bed Mobility: Supine to Sit, Sit to Supine     Supine to sit: Modified independent (Device/Increase time), HOB elevated, Used rails Sit to supine: Modified independent (Device/Increase time), HOB elevated, Used rails        Transfers Overall transfer level: Needs assistance Equipment used: Rolling walker (2 wheels) Transfers: Sit to/from Stand Sit to Stand: Supervision           General transfer comment: Cues for safety, hand placement.    Ambulation/Gait Ambulation/Gait assistance: Contact guard assist Gait Distance (Feet): 300 Feet Assistive device: Rolling walker (2  wheels) Gait Pattern/deviations: Step-through pattern, Decreased stride length       General Gait Details: Good gait speed. Dyspnea 2/4 towards end of distance. No overt LOB with RW use.  Stairs            Wheelchair Mobility     Tilt Bed    Modified Rankin (Stroke Patients Only)       Balance Overall balance assessment: Needs assistance         Standing balance support: Bilateral upper extremity supported, During functional activity, Reliant on assistive device for balance Standing balance-Leahy Scale: Fair                               Pertinent Vitals/Pain Pain Assessment Pain Assessment: No/denies pain    Home Living Family/patient expects to be discharged to:: Private residence Living Arrangements: Spouse/significant other Available Help at Discharge: Family;Available 24 hours/day Type of Home: House Home Access: Stairs to enter   [employer] of Steps: 2   Home Layout: One level Home Equipment: Agricultural consultant (2 wheels);Cane - single point;Shower seat;BSC/3in1;Grab bars - tub/shower      Prior Function Prior Level of Function : Needs assist             Mobility Comments: ambulatory with cane vs RW       Extremity/Trunk Assessment   Upper Extremity Assessment Upper Extremity Assessment: Defer to OT evaluation    Lower Extremity Assessment Lower Extremity Assessment: Generalized weakness       Communication   Communication Communication: No apparent difficulties Factors Affecting Communication: Hearing impaired    Cognition  Arousal: Alert Behavior During Therapy: WFL for tasks assessed/performed   PT - Cognitive impairments: No apparent impairments                         Following commands: Intact       Cueing       General Comments      Exercises     Assessment/Plan    PT Assessment Patient needs continued PT services  PT Problem List Decreased strength;Decreased activity  tolerance;Decreased balance;Decreased mobility;Decreased cognition;Decreased knowledge of use of DME;Decreased safety awareness;Decreased knowledge of precautions       PT Treatment Interventions DME instruction;Gait training;Functional mobility training;Therapeutic activities;Therapeutic exercise;Patient/family education;Balance training    PT Goals (Current goals can be found in the Care Plan section)  Acute Rehab PT Goals Patient Stated Goal: regain plof/independence PT Goal Formulation: With patient Time For Goal Achievement: 03/25/23 Potential to Achieve Goals: Good    Frequency Min 1X/week     Co-evaluation               AM-PAC PT "6 Clicks" Mobility  Outcome Measure Help needed turning from your back to your side while in a flat bed without using bedrails?: None Help needed moving from lying on your back to sitting on the side of a flat bed without using bedrails?: None Help needed moving to and from a bed to a chair (including a wheelchair)?: A Little Help needed standing up from a chair using your arms (e.g., wheelchair or bedside chair)?: A Little Help needed to walk in hospital room?: A Little Help needed climbing 3-5 steps with a railing? : A Little 6 Click Score: 20    End of Session Equipment Utilized During Treatment: Gait belt Activity Tolerance: Patient tolerated treatment well Patient left: in bed;with call bell/phone within reach;with bed alarm set   PT Visit Diagnosis: Difficulty in walking, not elsewhere classified (R26.2);Muscle weakness (generalized) (M62.81)    Time: 1610-9604 PT Time Calculation (min) (ACUTE ONLY): 15 min   Charges:   PT Evaluation $PT Eval Low Complexity: 1 Low   PT General Charges $$ ACUTE PT VISIT: 1 Visit            Faye Ramsay, PT Acute Rehabilitation  Office: [phone]

## 2023-03-11 NOTE — H&P (View-Only) (Signed)
 Subjective: [name] was readmitted with a CAUTI but is doing well on therapy.  He is scheduled for ureteroscopy tomorrow.   He has been started back on Warfarin but his INR was only 1.7 this morning.  He had a repeat CT on 3/1 and had the foley in the prostatic urethra. It has been replaced.  The stent was in good position with stones at the UPJ and in the kidney on the right.  He has a large RLP hemorrhagic cyst as well.  He is on ceftriaxone for e. Coli on the blood culture ID panel.   His urine culture is pending.  ROS:  Review of Systems  Respiratory:  Negative for shortness of breath.   Cardiovascular:  Negative for chest pain.    Anti-infectives: Anti-infectives (From admission, onward)    Start     Dose/Rate Route Frequency Ordered Stop   03/10/23 2200  cefTRIAXone (ROCEPHIN) 2 g in sodium chloride 0.9 % 100 mL IVPB        2 g 200 mL/hr over 30 Minutes Intravenous Every 24 hours 03/10/23 1740     03/10/23 1000  meropenem (MERREM) 1 g in sodium chloride 0.9 % 100 mL IVPB  Status:  Discontinued        1 g 200 mL/hr over 30 Minutes Intravenous Every 8 hours 03/10/23 0133 03/10/23 1739   03/10/23 0000  meropenem (MERREM) 1 g in sodium chloride 0.9 % 100 mL IVPB        1 g 200 mL/hr over 30 Minutes Intravenous  Once 03/09/23 2356 03/10/23 0319       Current Facility-Administered Medications  Medication Dose Route Frequency Provider Last Rate Last Admin   acetaminophen (TYLENOL) tablet 650 mg  650 mg Oral Q6H PRN Charlsie Quest, MD       Or   acetaminophen (TYLENOL) suppository 650 mg  650 mg Rectal Q6H PRN Darreld Mclean R, MD       cefTRIAXone (ROCEPHIN) 2 g in sodium chloride 0.9 % 100 mL IVPB  2 g Intravenous Q24H Standley Brooking, MD 200 mL/hr at 03/10/23 2118 2 g at 03/10/23 2118   Chlorhexidine Gluconate Cloth 2 % PADS 6 each  6 each Topical Daily Standley Brooking, MD   6 each at 03/10/23 1417   feeding supplement (ENSURE ENLIVE / ENSURE PLUS) liquid 237 mL  237  mL Oral BID BM Standley Brooking, MD   237 mL at 03/11/23 0928   finasteride (PROSCAR) tablet 5 mg  5 mg Oral Daily Darreld Mclean R, MD   5 mg at 03/11/23 0927   ondansetron (ZOFRAN) tablet 4 mg  4 mg Oral Q6H PRN Charlsie Quest, MD       Or   ondansetron (ZOFRAN) injection 4 mg  4 mg Intravenous Q6H PRN Charlsie Quest, MD       polyethylene glycol (MIRALAX / GLYCOLAX) packet 17 g  17 g Oral Daily Darreld Mclean R, MD       rosuvastatin (CRESTOR) tablet 10 mg  10 mg Oral Daily Darreld Mclean R, MD   10 mg at 03/11/23 1610   senna-docusate (Senokot-S) tablet 1 tablet  1 tablet Oral QHS PRN Darreld Mclean R, MD       sodium chloride flush (NS) 0.9 % injection 3 mL  3 mL Intravenous Q12H Darreld Mclean R, MD   3 mL at 03/11/23 0928   tamsulosin (FLOMAX) capsule 0.4 mg  0.4 mg Oral Daily Darreld Mclean  R, MD       traZODone (DESYREL) tablet 25 mg  25 mg Oral QHS PRN Darreld Mclean R, MD       warfarin (COUMADIN) tablet 7.5 mg  7.5 mg Oral ONCE-1600 Standley Brooking, MD       Warfarin - Pharmacist Dosing Inpatient   Does not apply q1600 Phylliss Blakes, [facility]         Objective: Vital signs in last 24 hours: Temp:  [97.7 F (36.5 C)-99.2 F (37.3 C)] 98.1 F (36.7 C) (03/03 0606) Pulse Rate:  [54-61] 61 (03/03 0606) Resp:  [16-18] 16 (03/03 0606) BP: (94-137)/(49-65) 118/65 (03/03 0606) SpO2:  [96 %-98 %] 96 % (03/03 0606) Weight:  [73.1 kg] 73.1 kg (03/03 0606)  Intake/Output from previous day: 03/02 0701 - 03/03 0700 In: 432.7 [P.O.:240; IV Piggyback:192.7] Out: 1350 [Urine:1350] Intake/Output this shift: No intake/output data recorded.   Physical Exam Vitals reviewed.  Constitutional:      Comments: Somewhat disheveled but alert and oriented.   Cardiovascular:     Rate and Rhythm: Normal rate and regular rhythm.     Heart sounds: Normal heart sounds.  Pulmonary:     Effort: Pulmonary effort is normal.     Breath sounds: Normal breath sounds.  Abdominal:     General:  Abdomen is flat.     Palpations: Abdomen is soft.  Genitourinary:    Comments: Foley draining clear urine.  Neurological:     Mental Status: He is alert.     Lab Results:  Recent Labs    03/09/23 2048 03/10/23 0600  WBC 13.7* 12.3*  HGB 9.6* 7.9*  HCT 31.5* 26.5*  PLT 175 146*   BMET Recent Labs    03/09/23 2048 03/10/23 0600  NA 141 143  K 3.7 3.7  CL 108 112*  CO2 22 22  GLUCOSE 182* 142*  BUN 18 19  CREATININE 1.08 1.08  CALCIUM 8.9 8.4*   PT/INR Recent Labs    03/09/23 2048 03/10/23 0600  LABPROT 19.9* 20.5*  INR 1.7* 1.7*   ABG No results for input(s): "PHART", "HCO3" in the last 72 hours.  Invalid input(s): "PCO2", "PO2"  Studies/Results: CT CHEST ABDOMEN PELVIS W CONTRAST Result Date: 03/09/2023 CLINICAL DATA:  Sepsis hx of pneumonia, ureteral stone, UTI - dementia - here with sepsis, nausea vomiting - evaluation for PNA vs intraabdominal infection EXAM: CT CHEST, ABDOMEN, AND PELVIS WITH CONTRAST TECHNIQUE: Multidetector CT imaging of the chest, abdomen and pelvis was performed following the standard protocol during bolus administration of intravenous contrast. RADIATION DOSE REDUCTION: This exam was performed according to the departmental dose-optimization program which includes automated exposure control, adjustment of the mA and/or kV according to patient size and/or use of iterative reconstruction technique. CONTRAST:  OMNIPAQUE IOHEXOL 300 MG/ML  SOLN COMPARISON:  CT renal 02/06/2023, CT abdomen pelvis 09/23/2021 FINDINGS: CT CHEST FINDINGS Cardiovascular: Enlarged heart size. No significant pericardial effusion. The thoracic aorta is normal in caliber. No atherosclerotic plaque of the thoracic aorta. The main pulmonary measures at the upper limits are normal. No central or segmental pulmonary embolus. Aortic valve replacement. Bicuspid and tricuspid valve replacement. Mediastinum/Nodes: No enlarged mediastinal, hilar, or axillary lymph nodes. Thyroid  gland, trachea, and esophagus demonstrate no significant findings. Lungs/Pleura: No focal consolidation. No pulmonary nodule. No pulmonary mass. Trace right pleural effusion. No pneumothorax. Musculoskeletal: No chest wall abnormality. No suspicious lytic or blastic osseous lesions. No acute displaced fracture. Multilevel degenerative changes of the spine. CT ABDOMEN  PELVIS FINDINGS Hepatobiliary: No focal liver abnormality. Status post cholecystectomy. No biliary dilatation. Pancreas: No focal lesion. Normal pancreatic contour. No surrounding inflammatory changes. No main pancreatic ductal dilatation. Spleen: Normal in size without focal abnormality. Adrenals/Urinary Tract: No adrenal nodule bilaterally. Bilateral kidneys enhance symmetrically. Slightly increased size heterogeneous right 8.5 x 8 cm right renal lesion. A 2.7 cm superior right renal pole lesion with a density of 41 Hounsfield units. Subcentimeter hyperdense right renal lesion with a density of 100 Hounsfield units likely hemorrhagic/proteinaceous cyst-no further follow-up indicated. Interval placement of a right ureteral stent with proximal pigtail within a superior renal pole calyx and distal pigtail at the right ureterovesicular junction. Associated interval resolution of right hydronephrosis. Interval trace migration of a 7 mm right calcified stone now in the proximal ureter just distal to the right ureteropelvic junction. No right hydroureter. Right nephrolithiasis measuring up to 9 mm. No left hydroureteronephrosis.  No left nephroureterolithiasis. The urinary bladder is unremarkable. Foley catheter tip terminating within the urinary bladder lumen and Foley catheter inflated balloon terminating within the prostatic urethra. Stomach/Bowel: Stomach is within normal limits. No evidence of bowel wall thickening or dilatation. Colonic diverticulosis. Appendix appears normal. Vascular/Lymphatic: No abdominal aorta or iliac aneurysm. Severe  atherosclerotic plaque of the aorta and its branches. No abdominal, pelvic, or inguinal lymphadenopathy. Reproductive: Prostate enlarged measuring up to 5.5 cm. Other: No intraperitoneal free fluid. No intraperitoneal free gas. No organized fluid collection. Musculoskeletal: No abdominal wall hernia or abnormality. No suspicious lytic or blastic osseous lesions. No acute displaced fracture. Multilevel degenerative changes of the spine. Total left hip arthroplasty partially visualized. IMPRESSION: 1. Trace right pleural effusion. 2. Foley catheter tip terminating within the urinary bladder lumen and Foley catheter inflated balloon terminating within the prostatic urethra. Recommend replacement. 3. Slightly increased size of an indeterminate heterogeneous right 8.5 x 8 cm renal lesion likely representing a hemorrhagic cyst with underlying malignancy not excluded. Indeterminate right superior renal pole lesion measuring 2.7 cm. When the patient is clinically stable and able to follow directions and hold their breath (preferably as an outpatient) further evaluation with dedicated MRI renal protocol should be considered. 4. Interval resolution of right hydronephrosis status post right ureteral stent placement and trace distal migration of a 7 mm calcified stone now in the proximal ureter just distal to the right ureteral pelvic junction. 5. Nonobstructive right nephrolithiasis measuring up to 9 mm. 6. Prostatomegaly. 7. Colonic diverticulosis with no acute diverticulitis. 8.  Aortic Atherosclerosis (ICD10-I70.0). Electronically Signed   By: Tish Frederickson M.D.   On: 03/09/2023 23:25   DG Chest Port 1 View Result Date: 03/09/2023 CLINICAL DATA:  Weakness EXAM: PORTABLE CHEST 1 VIEW COMPARISON:  02/10/2023 FINDINGS: Post sternotomy changes, valve prostheses and atrial appendage clip. Cardiomegaly with vascular congestion and mild diffuse interstitial opacity suggestive of mild edema. No pleural effusion or pneumothorax  IMPRESSION: Cardiomegaly with vascular congestion and mild diffuse increased interstitial opacity compared to prior suggesting mild edema Electronically Signed   By: Jasmine Pang M.D.   On: 03/09/2023 21:21     Assessment and Plan: Right renal stones.   The ureteral stone is now with the UPJ with the stent in good position.   He has been cleared to have ureteroscopy as planned tomorrow.  Risks reviewed including bleeding, infection, ureteral injury and need for stent replacement along with risk of thrombosis and anesthetic complications.  2.   Right renal cyst with hemorrhage.   We will need to reassess this after he  is recovered from the ureteroscopy.   3.   BPH with foley.   He is on tamsulosin and finasteride and is interested in having the folye removed after the procedure tomorrow.       LOS: 1 day    Bjorn Pippin 3/3/2025Patient ID: [name]., male   DOB: 1935/07/13, 88 y.o.   MRN: [id number]

## 2023-03-11 NOTE — Progress Notes (Signed)
 Subjective: [name] was readmitted with a CAUTI but is doing well on therapy.  [name] is scheduled for ureteroscopy tomorrow.   [name] has been started back on Warfarin but his INR was only 1.7 this morning.  [name] had a repeat CT on 3/1 and had the foley in the prostatic urethra. It has been replaced.  The stent was in good position with stones at the UPJ and in the kidney on the right.  [name] has a large RLP hemorrhagic cyst as well.  [name] is on ceftriaxone for e. Coli on the blood culture ID panel.   His urine culture is pending.  ROS:  Review of Systems  Respiratory:  Negative for shortness of breath.   Cardiovascular:  Negative for chest pain.    Anti-infectives: Anti-infectives (From admission, onward)    Start     Dose/Rate Route Frequency Ordered Stop   03/10/23 2200  cefTRIAXone (ROCEPHIN) 2 g in sodium chloride 0.9 % 100 mL IVPB        2 g 200 mL/hr over 30 Minutes Intravenous Every 24 hours 03/10/23 1740     03/10/23 1000  meropenem (MERREM) 1 g in sodium chloride 0.9 % 100 mL IVPB  Status:  Discontinued        1 g 200 mL/hr over 30 Minutes Intravenous Every 8 hours 03/10/23 0133 03/10/23 1739   03/10/23 0000  meropenem (MERREM) 1 g in sodium chloride 0.9 % 100 mL IVPB        1 g 200 mL/hr over 30 Minutes Intravenous  Once 03/09/23 2356 03/10/23 0319       Current Facility-Administered Medications  Medication Dose Route Frequency Provider Last Rate Last Admin   acetaminophen (TYLENOL) tablet 650 mg  650 mg Oral Q6H PRN Charlsie Quest, MD       Or   acetaminophen (TYLENOL) suppository 650 mg  650 mg Rectal Q6H PRN Darreld Mclean R, MD       cefTRIAXone (ROCEPHIN) 2 g in sodium chloride 0.9 % 100 mL IVPB  2 g Intravenous Q24H Standley Brooking, MD 200 mL/hr at 03/10/23 2118 2 g at 03/10/23 2118   Chlorhexidine Gluconate Cloth 2 % PADS 6 each  6 each Topical Daily Standley Brooking, MD   6 each at 03/10/23 1417   feeding supplement (ENSURE ENLIVE / ENSURE PLUS) liquid 237 mL  237  mL Oral BID BM Standley Brooking, MD   237 mL at 03/11/23 0928   finasteride (PROSCAR) tablet 5 mg  5 mg Oral Daily Darreld Mclean R, MD   5 mg at 03/11/23 0927   ondansetron (ZOFRAN) tablet 4 mg  4 mg Oral Q6H PRN Charlsie Quest, MD       Or   ondansetron (ZOFRAN) injection 4 mg  4 mg Intravenous Q6H PRN Charlsie Quest, MD       polyethylene glycol (MIRALAX / GLYCOLAX) packet 17 g  17 g Oral Daily Darreld Mclean R, MD       rosuvastatin (CRESTOR) tablet 10 mg  10 mg Oral Daily Darreld Mclean R, MD   10 mg at 03/11/23 1610   senna-docusate (Senokot-S) tablet 1 tablet  1 tablet Oral QHS PRN Darreld Mclean R, MD       sodium chloride flush (NS) 0.9 % injection 3 mL  3 mL Intravenous Q12H Darreld Mclean R, MD   3 mL at 03/11/23 0928   tamsulosin (FLOMAX) capsule 0.4 mg  0.4 mg Oral Daily Darreld Mclean  R, MD       traZODone (DESYREL) tablet 25 mg  25 mg Oral QHS PRN Darreld Mclean R, MD       warfarin (COUMADIN) tablet 7.5 mg  7.5 mg Oral ONCE-1600 Standley Brooking, MD       Warfarin - Pharmacist Dosing Inpatient   Does not apply q1600 Phylliss Blakes, [facility]         Objective: Vital signs in last 24 hours: Temp:  [97.7 F (36.5 C)-99.2 F (37.3 C)] 98.1 F (36.7 C) (03/03 0606) Pulse Rate:  [54-61] 61 (03/03 0606) Resp:  [16-18] 16 (03/03 0606) BP: (94-137)/(49-65) 118/65 (03/03 0606) SpO2:  [96 %-98 %] 96 % (03/03 0606) Weight:  [73.1 kg] 73.1 kg (03/03 0606)  Intake/Output from previous day: 03/02 0701 - 03/03 0700 In: 432.7 [P.O.:240; IV Piggyback:192.7] Out: 1350 [Urine:1350] Intake/Output this shift: No intake/output data recorded.   Physical Exam Vitals reviewed.  Constitutional:      Comments: Somewhat disheveled but alert and oriented.   Cardiovascular:     Rate and Rhythm: Normal rate and regular rhythm.     Heart sounds: Normal heart sounds.  Pulmonary:     Effort: Pulmonary effort is normal.     Breath sounds: Normal breath sounds.  Abdominal:     General:  Abdomen is flat.     Palpations: Abdomen is soft.  Genitourinary:    Comments: Foley draining clear urine.  Neurological:     Mental Status: [name] is alert.     Lab Results:  Recent Labs    03/09/23 2048 03/10/23 0600  WBC 13.7* 12.3*  HGB 9.6* 7.9*  HCT 31.5* 26.5*  PLT 175 146*   BMET Recent Labs    03/09/23 2048 03/10/23 0600  NA 141 143  K 3.7 3.7  CL 108 112*  CO2 22 22  GLUCOSE 182* 142*  BUN 18 19  CREATININE 1.08 1.08  CALCIUM 8.9 8.4*   PT/INR Recent Labs    03/09/23 2048 03/10/23 0600  LABPROT 19.9* 20.5*  INR 1.7* 1.7*   ABG No results for input(s): "PHART", "HCO3" in the last 72 hours.  Invalid input(s): "PCO2", "PO2"  Studies/Results: CT CHEST ABDOMEN PELVIS W CONTRAST Result Date: 03/09/2023 CLINICAL DATA:  Sepsis hx of pneumonia, ureteral stone, UTI - dementia - here with sepsis, nausea vomiting - evaluation for PNA vs intraabdominal infection EXAM: CT CHEST, ABDOMEN, AND PELVIS WITH CONTRAST TECHNIQUE: Multidetector CT imaging of the chest, abdomen and pelvis was performed following the standard protocol during bolus administration of intravenous contrast. RADIATION DOSE REDUCTION: This exam was performed according to the departmental dose-optimization program which includes automated exposure control, adjustment of the mA and/or kV according to patient size and/or use of iterative reconstruction technique. CONTRAST:  OMNIPAQUE IOHEXOL 300 MG/ML  SOLN COMPARISON:  CT renal 02/06/2023, CT abdomen pelvis 09/23/2021 FINDINGS: CT CHEST FINDINGS Cardiovascular: Enlarged heart size. No significant pericardial effusion. The thoracic aorta is normal in caliber. No atherosclerotic plaque of the thoracic aorta. The main pulmonary measures at the upper limits are normal. No central or segmental pulmonary embolus. Aortic valve replacement. Bicuspid and tricuspid valve replacement. Mediastinum/Nodes: No enlarged mediastinal, hilar, or axillary lymph nodes. Thyroid  gland, trachea, and esophagus demonstrate no significant findings. Lungs/Pleura: No focal consolidation. No pulmonary nodule. No pulmonary mass. Trace right pleural effusion. No pneumothorax. Musculoskeletal: No chest wall abnormality. No suspicious lytic or blastic osseous lesions. No acute displaced fracture. Multilevel degenerative changes of the spine. CT ABDOMEN  PELVIS FINDINGS Hepatobiliary: No focal liver abnormality. Status post cholecystectomy. No biliary dilatation. Pancreas: No focal lesion. Normal pancreatic contour. No surrounding inflammatory changes. No main pancreatic ductal dilatation. Spleen: Normal in size without focal abnormality. Adrenals/Urinary Tract: No adrenal nodule bilaterally. Bilateral kidneys enhance symmetrically. Slightly increased size heterogeneous right 8.5 x 8 cm right renal lesion. A 2.7 cm superior right renal pole lesion with a density of 41 Hounsfield units. Subcentimeter hyperdense right renal lesion with a density of 100 Hounsfield units likely hemorrhagic/proteinaceous cyst-no further follow-up indicated. Interval placement of a right ureteral stent with proximal pigtail within a superior renal pole calyx and distal pigtail at the right ureterovesicular junction. Associated interval resolution of right hydronephrosis. Interval trace migration of a 7 mm right calcified stone now in the proximal ureter just distal to the right ureteropelvic junction. No right hydroureter. Right nephrolithiasis measuring up to 9 mm. No left hydroureteronephrosis.  No left nephroureterolithiasis. The urinary bladder is unremarkable. Foley catheter tip terminating within the urinary bladder lumen and Foley catheter inflated balloon terminating within the prostatic urethra. Stomach/Bowel: Stomach is within normal limits. No evidence of bowel wall thickening or dilatation. Colonic diverticulosis. Appendix appears normal. Vascular/Lymphatic: No abdominal aorta or iliac aneurysm. Severe  atherosclerotic plaque of the aorta and its branches. No abdominal, pelvic, or inguinal lymphadenopathy. Reproductive: Prostate enlarged measuring up to 5.5 cm. Other: No intraperitoneal free fluid. No intraperitoneal free gas. No organized fluid collection. Musculoskeletal: No abdominal wall hernia or abnormality. No suspicious lytic or blastic osseous lesions. No acute displaced fracture. Multilevel degenerative changes of the spine. Total left hip arthroplasty partially visualized. IMPRESSION: 1. Trace right pleural effusion. 2. Foley catheter tip terminating within the urinary bladder lumen and Foley catheter inflated balloon terminating within the prostatic urethra. Recommend replacement. 3. Slightly increased size of an indeterminate heterogeneous right 8.5 x 8 cm renal lesion likely representing a hemorrhagic cyst with underlying malignancy not excluded. Indeterminate right superior renal pole lesion measuring 2.7 cm. When the patient is clinically stable and able to follow directions and hold their breath (preferably as an outpatient) further evaluation with dedicated MRI renal protocol should be considered. 4. Interval resolution of right hydronephrosis status post right ureteral stent placement and trace distal migration of a 7 mm calcified stone now in the proximal ureter just distal to the right ureteral pelvic junction. 5. Nonobstructive right nephrolithiasis measuring up to 9 mm. 6. Prostatomegaly. 7. Colonic diverticulosis with no acute diverticulitis. 8.  Aortic Atherosclerosis (ICD10-I70.0). Electronically Signed   By: Tish Frederickson M.D.   On: 03/09/2023 23:25   DG Chest Port 1 View Result Date: 03/09/2023 CLINICAL DATA:  Weakness EXAM: PORTABLE CHEST 1 VIEW COMPARISON:  02/10/2023 FINDINGS: Post sternotomy changes, valve prostheses and atrial appendage clip. Cardiomegaly with vascular congestion and mild diffuse interstitial opacity suggestive of mild edema. No pleural effusion or pneumothorax  IMPRESSION: Cardiomegaly with vascular congestion and mild diffuse increased interstitial opacity compared to prior suggesting mild edema Electronically Signed   By: Jasmine Pang M.D.   On: 03/09/2023 21:21     Assessment and Plan: Right renal stones.   The ureteral stone is now with the UPJ with the stent in good position.   [name] has been cleared to have ureteroscopy as planned tomorrow.  Risks reviewed including bleeding, infection, ureteral injury and need for stent replacement along with risk of thrombosis and anesthetic complications.  2.   Right renal cyst with hemorrhage.   We will need to reassess this after [name]  is recovered from the ureteroscopy.   3.   BPH with foley.   [name] is on tamsulosin and finasteride and is interested in having the folye removed after the procedure tomorrow.       LOS: 1 day    [name] 3/3/2025Patient ID: Gerald Lou., male   DOB: 1935/07/13, 88 y.o.   MRN: [id number]

## 2023-03-11 NOTE — Anesthesia Preprocedure Evaluation (Signed)
 Anesthesia Evaluation  Patient identified by MRN, date of birth, ID band Patient awake  General Assessment Comment:H/o urinary retention post op  Reviewed: Allergy & Precautions, H&P , NPO status , Patient's Chart, lab work & pertinent test results  History of Anesthesia Complications (+) history of anesthetic complications  Airway Mallampati: III  TM Distance: >3 FB Neck ROM: Full    Dental no notable dental hx. (+) Dental Advisory Given, Poor Dentition   Pulmonary    Pulmonary exam normal breath sounds clear to auscultation       Cardiovascular hypertension, Pt. on medications pulmonary hypertension (severe pHTN)+ CAD (nonobstructive on cath 2019)  Normal cardiovascular exam+ dysrhythmias (s/p MAZE) Atrial Fibrillation + Valvular Problems/Murmurs (mod MS and mild AS and mild TR s/p MVR/AVR/TVR)  Rhythm:Regular Rate:Tachycardia  Echo yesterday:  1. S/P MV repair with mean gradient 8 mmHg and no MR; s/p AVR with mean  gradient 14 mmHg and no AI; s/p TV repair with mild TR.   2. Left ventricular ejection fraction, by estimation, is 60 to 65%. The  left ventricle has normal function. The left ventricle has no regional  wall motion abnormalities. There is moderate left ventricular hypertrophy.  Left ventricular diastolic  parameters are indeterminate. There is the interventricular septum is  flattened in systole and diastole, consistent with right ventricular  pressure and volume overload.   3. Right ventricular systolic function is normal. The right ventricular  size is mildly enlarged. There is severely elevated pulmonary artery  systolic pressure.   4. Left atrial size was severely dilated.   5. Right atrial size was severely dilated.   6. The mitral valve has been repaired/replaced. No evidence of mitral  valve regurgitation. No evidence of mitral stenosis. There is a prosthetic  annuloplasty ring present in the mitral  position.   7. The tricuspid valve is has been repaired/replaced. The tricuspid valve  is status post repair with an annuloplasty ring.   8. The aortic valve has been repaired/replaced. Aortic valve  regurgitation is not visualized. No aortic stenosis is present. There is a  bioprosthetic valve present in the aortic position.   9. The inferior vena cava is dilated in size with <50% respiratory  variability, suggesting right atrial pressure of 15 mmHg.    Stress test 2018  Nuclear stress EF: 60%.  Blood pressure demonstrated a normal response to exercise.  Horizontal ST segment depression ST segment depression of 2 mm was noted during stress in the II, III, aVF, V6, V5 and V4 leads.  This is a low risk study.  ST segment depression of 2 mm was noted during stress in the II, III, aVF, V6, V5 and V4 leads.  The test was stopped because the patient complained of fatigue and shortness of breath  Overall, the patient's exercise capacity was moderately impaired  Duke Treadmill Score: intermediate risk   Hypotensive in preop 8-90SBP    Neuro/Psych        Very altered mental status upon picking patient up for surgery this AM per nurses, minimal responsiveness in preop. CVA    GI/Hepatic negative GI ROS, Neg liver ROS,,,  Endo/Other  diabetes (prediabetic)    Renal/GU CRFRenal disease  negative genitourinary   Musculoskeletal  (+) Arthritis , Osteoarthritis,    Abdominal   Peds negative pediatric ROS (+)  Hematology  (+) Blood dyscrasia, anemia   Anesthesia Other Findings   Reproductive/Obstetrics negative OB ROS  Anesthesia Physical Anesthesia Plan  ASA: 4  Anesthesia Plan: General   Post-op Pain Management: Ofirmev IV (intra-op)*   Induction: Intravenous  PONV Risk Score and Plan: Treatment may vary due to age or medical condition and Ondansetron  Airway Management Planned: Oral ETT and LMA  Additional  Equipment: ClearSight  Intra-op Plan:   Post-operative Plan: Extubation in OR  Informed Consent: I have reviewed the patients History and Physical, chart, labs and discussed the procedure including the risks, benefits and alternatives for the proposed anesthesia with the patient or authorized representative who has indicated his/her understanding and acceptance.     Dental advisory given  Plan Discussed with: CRNA and Anesthesiologist  Anesthesia Plan Comments: (88 year old man PMH including A-fib, valvular heart disease, recent right ureteral stone status post stent late January, indwelling Foley catheter, presented with nausea, vomiting and generalized weakness.  Admitted for catheter associated/complicated UTI.     Recently admitted 1/25-2/14 for multiple issues including Enterobacter cloacae UTI/bacteremia, right ureteral calculus s/p stenting on 1/30, ileus, AKI, anemia and thrombocytopenia (received 2 units platelets, 1 unit FFP, 4 units PRBCs), and small acute left frontal and left temporal white matter infarcts thought to be related to FFP/platelet infusion.  MRA showed severe stenosis of right P2 PCA.   Assessment and Plan: Sepsis secondary to [name] associated with chronic indwelling Foley catheter E. coli bacteremia PMH right ureteral stone s/p stent placement 02/07/2023 BPH  From SNF.    CT showed malpositioned Foley catheter, resolution of prior right hydronephrosis s/p ureteral stent placement and migration of 7 mm stone now in the proximal ureter.  Foley catheter exchanged in the emergency department.  Clinically improving.  Continue empiric antibiotics.  Follow-up blood and urine cultures.  Continue Proscar and Flomax.  Keep already scheduled cystoscopy with Dr. Annabell Howells 3/4 for tomorrow   Permanent atrial fibrillation S/p maze 2020 S/p bioprosthetic AVR, MV and TV repair:  Remains in atrial fibrillation.  Intermittent mild bradycardic episodes.  INR is  1.7.  Holding atenolol given bradycardia  Holding warfarin tonight in anticipation of procedure tomorrow   Chronic HFpEF with pulmonary HTN:  Not on diuretics as an outpatient.  Appears stable.  Holding beta-blocker as above.   Recent CVA  Continue rosuvastatin   Resume warfarin after procedure   Anemia of chronic disease:  Hypertension  Borderline low BP.  Holding atenolol and amlodipine. )        Anesthesia Quick Evaluation

## 2023-03-12 ENCOUNTER — Ambulatory Visit (HOSPITAL_COMMUNITY): Admission: RE | Admit: 2023-03-12 | Payer: Medicare Other | Source: Home / Self Care | Admitting: Urology

## 2023-03-12 ENCOUNTER — Inpatient Hospital Stay (HOSPITAL_COMMUNITY): Admitting: Certified Registered Nurse Anesthetist

## 2023-03-12 ENCOUNTER — Encounter (HOSPITAL_COMMUNITY)
Admission: EM | Disposition: A | Discharge status: Home Health | Payer: Self-pay | Source: Skilled Nursing Facility | Attending: Family Medicine

## 2023-03-12 ENCOUNTER — Inpatient Hospital Stay (HOSPITAL_COMMUNITY)

## 2023-03-12 DIAGNOSIS — Z7901 Long term (current) use of anticoagulants: Secondary | ICD-10-CM | POA: Diagnosis not present

## 2023-03-12 DIAGNOSIS — D500 Iron deficiency anemia secondary to blood loss (chronic): Secondary | ICD-10-CM

## 2023-03-12 DIAGNOSIS — N390 Urinary tract infection, site not specified: Secondary | ICD-10-CM | POA: Diagnosis not present

## 2023-03-12 DIAGNOSIS — N202 Calculus of kidney with calculus of ureter: Secondary | ICD-10-CM | POA: Diagnosis not present

## 2023-03-12 DIAGNOSIS — Z862 Personal history of diseases of the blood and blood-forming organs and certain disorders involving the immune mechanism: Secondary | ICD-10-CM

## 2023-03-12 DIAGNOSIS — A4151 Sepsis due to Escherichia coli [E. coli]: Secondary | ICD-10-CM | POA: Diagnosis not present

## 2023-03-12 DIAGNOSIS — I10 Essential (primary) hypertension: Secondary | ICD-10-CM | POA: Diagnosis not present

## 2023-03-12 DIAGNOSIS — R7881 Bacteremia: Secondary | ICD-10-CM | POA: Insufficient documentation

## 2023-03-12 DIAGNOSIS — T83511A Infection and inflammatory reaction due to indwelling urethral catheter, initial encounter: Secondary | ICD-10-CM | POA: Diagnosis not present

## 2023-03-12 DIAGNOSIS — I2510 Atherosclerotic heart disease of native coronary artery without angina pectoris: Secondary | ICD-10-CM

## 2023-03-12 DIAGNOSIS — N200 Calculus of kidney: Secondary | ICD-10-CM | POA: Diagnosis not present

## 2023-03-12 DIAGNOSIS — D6859 Other primary thrombophilia: Secondary | ICD-10-CM

## 2023-03-12 HISTORY — DX: Localized edema: R60.0

## 2023-03-12 HISTORY — DX: Anemia, unspecified: D64.9

## 2023-03-12 HISTORY — DX: Bacteremia: R78.81

## 2023-03-12 LAB — CBC
HCT: 27.9 % — ABNORMAL LOW (ref 39.0–52.0)
Hemoglobin: 8.2 g/dL — ABNORMAL LOW (ref 13.0–17.0)
MCH: 28 pg (ref 26.0–34.0)
MCHC: 29.4 g/dL — ABNORMAL LOW (ref 30.0–36.0)
MCV: 95.2 fL (ref 80.0–100.0)
Platelets: 179 10*3/uL (ref 150–400)
RBC: 2.93 MIL/uL — ABNORMAL LOW (ref 4.22–5.81)
RDW: 16.6 % — ABNORMAL HIGH (ref 11.5–15.5)
WBC: 6.4 10*3/uL (ref 4.0–10.5)
nRBC: 0 % (ref 0.0–0.2)

## 2023-03-12 LAB — BASIC METABOLIC PANEL
Anion gap: 9 (ref 5–15)
BUN: 22 mg/dL (ref 8–23)
CO2: 25 mmol/L (ref 22–32)
Calcium: 8.6 mg/dL — ABNORMAL LOW (ref 8.9–10.3)
Chloride: 111 mmol/L (ref 98–111)
Creatinine, Ser: 0.95 mg/dL (ref 0.61–1.24)
GFR, Estimated: >60 mL/min (ref >60)
Glucose, Bld: 142 mg/dL — ABNORMAL HIGH (ref 70–99)
Potassium: 3.9 mmol/L (ref 3.5–5.1)
Sodium: 145 mmol/L (ref 135–145)

## 2023-03-12 LAB — CULTURE, BLOOD (ROUTINE X 2)

## 2023-03-12 LAB — TYPE AND SCREEN
ABO/RH(D): O POS
Antibody Screen: NEGATIVE

## 2023-03-12 SURGERY — CYSTOSCOPY/URETEROSCOPY/HOLMIUM LASER/STENT PLACEMENT
Anesthesia: General | Site: Pelvis | Laterality: Right

## 2023-03-12 MED ORDER — PHENYLEPHRINE HCL-NACL 20-0.9 MG/250ML-% IV SOLN
INTRAVENOUS | Status: DC | PRN
  Administered 2023-03-12: 40 ug/min via INTRAVENOUS

## 2023-03-12 MED ORDER — SUGAMMADEX SODIUM 200 MG/2ML IV SOLN
INTRAVENOUS | Status: AC
  Filled 2023-03-12: qty 2

## 2023-03-12 MED ORDER — MEPERIDINE HCL 50 MG/ML IJ SOLN: 6.2500 mg | INTRAMUSCULAR | Status: DC | PRN

## 2023-03-12 MED ORDER — FENTANYL CITRATE (PF) 100 MCG/2ML IJ SOLN
INTRAMUSCULAR | Status: DC | PRN
  Administered 2023-03-12: 12.5 ug via INTRAVENOUS

## 2023-03-12 MED ORDER — ONDANSETRON HCL 4 MG/2ML IJ SOLN: 4.0000 mg | Freq: Once | INTRAMUSCULAR | Status: DC | PRN

## 2023-03-12 MED ORDER — ROCURONIUM BROMIDE 100 MG/10ML IV SOLN
INTRAVENOUS | Status: DC | PRN
  Administered 2023-03-12: 50 mg via INTRAVENOUS

## 2023-03-12 MED ORDER — ARTIFICIAL TEARS OPHTHALMIC OINT
TOPICAL_OINTMENT | OPHTHALMIC | Status: AC
  Filled 2023-03-12: qty 3.5

## 2023-03-12 MED ORDER — LIDOCAINE HCL (PF) 2 % IJ SOLN
INTRAMUSCULAR | Status: AC
  Filled 2023-03-12: qty 5

## 2023-03-12 MED ORDER — PHENYLEPHRINE 80 MCG/ML (10ML) SYRINGE FOR IV PUSH (FOR BLOOD PRESSURE SUPPORT)
PREFILLED_SYRINGE | INTRAVENOUS | Status: AC
  Filled 2023-03-12: qty 10

## 2023-03-12 MED ORDER — FENTANYL CITRATE (PF) 100 MCG/2ML IJ SOLN
INTRAMUSCULAR | Status: AC
  Filled 2023-03-12: qty 2

## 2023-03-12 MED ORDER — KETOROLAC TROMETHAMINE 30 MG/ML IJ SOLN
INTRAMUSCULAR | Status: AC
  Filled 2023-03-12: qty 1

## 2023-03-12 MED ORDER — OXYCODONE HCL 5 MG/5ML PO SOLN: 5.0000 mg | Freq: Once | ORAL | Status: DC | PRN

## 2023-03-12 MED ORDER — WARFARIN SODIUM 5 MG PO TABS
5.0000 mg | ORAL_TABLET | Freq: Once | ORAL | Status: AC
  Filled 2023-03-12: qty 1

## 2023-03-12 MED ORDER — LIDOCAINE HCL (CARDIAC) PF 100 MG/5ML IV SOSY
PREFILLED_SYRINGE | INTRAVENOUS | Status: DC | PRN
  Administered 2023-03-12: 60 mg via INTRAVENOUS

## 2023-03-12 MED ORDER — ACETAMINOPHEN 160 MG/5ML PO SOLN: 325.0000 mg | ORAL | Status: DC | PRN

## 2023-03-12 MED ORDER — SUGAMMADEX SODIUM 200 MG/2ML IV SOLN
INTRAVENOUS | Status: DC | PRN
  Administered 2023-03-12: 200 mg via INTRAVENOUS
  Administered 2023-03-12: 100 mg via INTRAVENOUS

## 2023-03-12 MED ORDER — PHENYLEPHRINE HCL-NACL 20-0.9 MG/250ML-% IV SOLN
INTRAVENOUS | Status: AC
  Filled 2023-03-12: qty 500

## 2023-03-12 MED ORDER — PROPOFOL 10 MG/ML IV BOLUS
INTRAVENOUS | Status: DC | PRN
  Administered 2023-03-12: 20 mg via INTRAVENOUS
  Administered 2023-03-12: 70 mg via INTRAVENOUS

## 2023-03-12 MED ORDER — DEXAMETHASONE SODIUM PHOSPHATE 10 MG/ML IJ SOLN
INTRAMUSCULAR | Status: AC
  Filled 2023-03-12: qty 1

## 2023-03-12 MED ORDER — DEXAMETHASONE SODIUM PHOSPHATE 10 MG/ML IJ SOLN
INTRAMUSCULAR | Status: DC | PRN
  Administered 2023-03-12: 8 mg via INTRAVENOUS

## 2023-03-12 MED ORDER — PROPOFOL 500 MG/50ML IV EMUL
INTRAVENOUS | Status: AC
  Filled 2023-03-12: qty 50

## 2023-03-12 MED ORDER — ACETAMINOPHEN 325 MG PO TABS: 325.0000 mg | ORAL_TABLET | ORAL | Status: DC | PRN

## 2023-03-12 MED ORDER — OXYCODONE HCL 5 MG PO TABS: 5.0000 mg | ORAL_TABLET | Freq: Once | ORAL | Status: DC | PRN

## 2023-03-12 MED ORDER — FENTANYL CITRATE PF 50 MCG/ML IJ SOSY: 25.0000 ug | PREFILLED_SYRINGE | INTRAMUSCULAR | Status: DC | PRN

## 2023-03-12 MED ORDER — KETOROLAC TROMETHAMINE 30 MG/ML IJ SOLN
INTRAMUSCULAR | Status: DC | PRN
  Administered 2023-03-12: 15 mg via INTRAVENOUS

## 2023-03-12 MED ORDER — CEFAZOLIN SODIUM-DEXTROSE 2-4 GM/100ML-% IV SOLN: 2.0000 g | INTRAVENOUS | Status: DC

## 2023-03-12 MED ORDER — LACTATED RINGERS IV SOLN: INTRAVENOUS | Status: DC | PRN

## 2023-03-12 MED ORDER — ONDANSETRON HCL 4 MG/2ML IJ SOLN
INTRAMUSCULAR | Status: DC | PRN
  Administered 2023-03-12: 4 mg via INTRAVENOUS

## 2023-03-12 MED ORDER — PHENYLEPHRINE 80 MCG/ML (10ML) SYRINGE FOR IV PUSH (FOR BLOOD PRESSURE SUPPORT)
PREFILLED_SYRINGE | INTRAVENOUS | Status: DC | PRN
Start: 2023-03-12 — End: 2023-03-12
  Administered 2023-03-12 (×2): 100 ug via INTRAVENOUS

## 2023-03-12 MED ORDER — SODIUM CHLORIDE 0.9 % IR SOLN
Status: DC | PRN
  Administered 2023-03-12: 1000 mL
  Administered 2023-03-12: 3000 mL

## 2023-03-12 MED ORDER — ROCURONIUM BROMIDE 10 MG/ML (PF) SYRINGE
PREFILLED_SYRINGE | INTRAVENOUS | Status: AC
  Filled 2023-03-12: qty 10

## 2023-03-12 MED ORDER — ONDANSETRON HCL 4 MG/2ML IJ SOLN
INTRAMUSCULAR | Status: AC
  Filled 2023-03-12: qty 2

## 2023-03-12 MED ORDER — WARFARIN - PHARMACIST DOSING INPATIENT: Freq: Every day | Status: DC

## 2023-03-12 MED ORDER — CEFAZOLIN SODIUM-DEXTROSE 2-4 GM/100ML-% IV SOLN
2.0000 g | Freq: Three times a day (TID) | INTRAVENOUS | Status: DC
  Administered 2023-03-12 – 2023-03-13 (×2): 2 g via INTRAVENOUS
  Filled 2023-03-12 (×2): qty 100

## 2023-03-12 SURGICAL SUPPLY — 23 items
BAG URO CATCHER STRL LF (MISCELLANEOUS) ×1 IMPLANT
BASKET STONE NCOMPASS (UROLOGICAL SUPPLIES) IMPLANT
CATH URETERAL DUAL LUMEN 10F (MISCELLANEOUS) IMPLANT
CATH URETL OPEN 5X70 (CATHETERS) IMPLANT
CLOTH BEACON ORANGE TIMEOUT ST (SAFETY) ×1 IMPLANT
EXTRACTOR STONE NITINOL NGAGE (UROLOGICAL SUPPLIES) IMPLANT
FIBER LASER MOSES 200 DFL (Laser) IMPLANT
GLOVE SURG SS PI 8.0 STRL IVOR (GLOVE) ×1 IMPLANT
GOWN SPEC L4 XLG W/TWL (GOWN DISPOSABLE) ×1 IMPLANT
GUIDEWIRE STR DUAL SENSOR (WIRE) ×1 IMPLANT
IV NS IRRIG 3000ML ARTHROMATIC (IV SOLUTION) ×1 IMPLANT
KIT TURNOVER KIT A (KITS) IMPLANT
LASER FIB FLEXIVA PULSE ID 365 (Laser) IMPLANT
LASER FIB FLEXIVA PULSE ID 550 (Laser) IMPLANT
LASER FIB FLEXIVA PULSE ID 910 (Laser) IMPLANT
MANIFOLD NEPTUNE II (INSTRUMENTS) ×1 IMPLANT
PACK CYSTO (CUSTOM PROCEDURE TRAY) ×1 IMPLANT
SHEATH NAV HD 11/13X46 (SHEATH) IMPLANT
SHEATH NAVIGATOR HD 11/13X36 (SHEATH) IMPLANT
STENT URET 6FRX26 CONTOUR (STENTS) IMPLANT
TRACTIP FLEXIVA PULS ID 200XHI (Laser) IMPLANT
TUBING CONNECTING 10 (TUBING) ×1 IMPLANT
TUBING UROLOGY SET (TUBING) ×1 IMPLANT

## 2023-03-12 NOTE — Transfer of Care (Signed)
 Immediate Anesthesia Transfer of Care Note  Patient: [name].  Procedure(s) Performed: CYSTOSCOPY/RIGHT URETEROSCOPY/HOLMIUM LASER/STENT PLACEMENT AND RETROGRADE PYELOGRAM (Right: Pelvis)  Patient Location: PACU  Anesthesia Type:General  Level of Consciousness: awake, alert , and oriented  Airway & Oxygen Therapy: Patient Spontanous Breathing  Post-op Assessment: Report given to RN and Post -op Vital signs reviewed and stable  Post vital signs: Reviewed and stable  Last Vitals:  Vitals Value Taken Time  BP 134/80 03/12/23 0918  Temp    Pulse 78 03/12/23 0921  Resp 17 03/12/23 0921  SpO2 94 % 03/12/23 0921  Vitals shown include unfiled device data.  Last Pain:  Vitals:   03/11/23 2110  TempSrc:   PainSc: 0-No pain         Complications: No notable events documented.

## 2023-03-12 NOTE — Progress Notes (Signed)
 Patient voided 200 cc dark red urine. Hold coumadin per MD verbal order.

## 2023-03-12 NOTE — Consult Note (Signed)
 [facility] Liaison Consult Note   03/12/2023  [name]. 28-Jul-1935 [id number]  Insurance: [employer] [employer] Medicare  Primary Care Provider: Jarrett Soho, Cordelia Poche, with Deboraha Sprang at [facility], this provider is listed for the transition of care follow up appointments  and [facility] Team for calls   [facility] Liaison screened the patient remotely at [facility].    The patient was screened for 30 day readmission hospitalization with noted high risk score for unplanned readmission risk 2 hospital admissions in 6 months.   The patient was assessed for potential [facility] Coordination service needs for post hospital transition for care coordination. Review of patient's electronic medical record reveals patient is currently recommended for home with [facility] per inpatient TOC review, SDOH intact.  Plan: [facility] Liaison will continue to follow progress and disposition to asess for post hospital community care coordination/management needs.  Referral request for community care coordination: Currently, anticipate [facility] Transition team for follow up and no other needs assessed at this time.    [facility] Community Care, Population Health does not replace or interfere with any arrangements made by the Inpatient Transition of Care team.   For questions contact:   Charlesetta Shanks, RN, BSN, CCM [place]  [facility], [facility] Liaison Direct Dial: [phone] or secure chat Email: [email or web address]

## 2023-03-12 NOTE — Anesthesia Postprocedure Evaluation (Signed)
 Anesthesia Post Note  Patient: [name].  Procedure(s) Performed: CYSTOSCOPY/RIGHT URETEROSCOPY/HOLMIUM LASER/STENT PLACEMENT AND RETROGRADE PYELOGRAM (Right: Pelvis)     Patient location during evaluation: PACU Anesthesia Type: General Level of consciousness: awake and alert Pain management: pain level controlled Vital Signs Assessment: post-procedure vital signs reviewed and stable Respiratory status: spontaneous breathing, nonlabored ventilation, respiratory function stable and patient connected to nasal cannula oxygen Cardiovascular status: blood pressure returned to baseline and stable Postop Assessment: no apparent nausea or vomiting Anesthetic complications: no   No notable events documented.  Last Vitals:  Vitals:   03/12/23 0930 03/12/23 0948  BP: 135/72 131/69  Pulse: 77 72  Resp: 15 18  Temp:    SpO2: 96% 96%    Last Pain:  Vitals:   03/12/23 0948  TempSrc:   PainSc: 0-No pain                 Belanna Manring

## 2023-03-12 NOTE — Op Note (Signed)
 Procedure: 1.  Cystoscopy with right ureteroscopy with holmium laser application, stone extraction and stent exchange for right proximal stone. 2.  Right ureteroscopy with holmium laser application and stone extraction for right renal stone. 3.  Application of fluoroscopy.  Preop diagnosis: 7 mm right proximal ureteral stone and 9 mm right midpole renal stone.  Postop diagnosis: Same with 4 mm right lower pole stone.  Surgeon: Dr. Bjorn Pippin.  Anesthesia: General.  Specimen: Stone fragments.  Drains: 6 French by 26 cm right contour double-J stent.  EBL: None.  Complications: None.  Indications: The patient is an 88 year old male with a history of a right proximal ureteral stone with sepsis who was stented previously.  He also has right renal stones.  He returns now for ureteroscopic management of the stones.  Procedure: He had received Rocephin approximately 10 hours before surgery as part of treatment of urinary tract infection.  He was taken the operating room where general anesthetic was induced.  He is placed in lithotomy position and fitted with PAS hose.  His Foley catheter was removed he was prepped with Betadine solution and draped in usual sterile fashion.  Cystoscopy was performed using a 21 Jamaica scope and 30 degree lens.  Examination revealed a normal urethra.  The external sphincter was intact.  The prostatic urethra was approximately 2 cm with mild lateral lobe hyperplasia without significant coaptation.  Examination the bladder revealed mild trabeculation without tumors or stones.  The left ureteral orifice was unremarkable.  There was a stent at the right ureteral orifice.  The stent was grasped and pulled to the urethral meatus but the proximal tip slipped into the bladder so the stent was removed, the cystoscope was reinserted and a wire was passed to the kidney under fluoroscopic guidance.  A 36 cm digital access sheath was then passed over the wire to below the stone  which was readily visible on fluoroscopy and the inner core and wire were removed.  The dual lumen digital flexible ureteroscope was then passed to the stone and once it was inspected was felt the safety wire would be appropriate.  The ureteroscope was removed and 2 wires w were passed through the sheath to the kidney.  The access sheath was then reinserted over one of the wires leaving a second wire as a safety wire.  The ureteroscope was then reinserted and a 242 m holmium laser fiber was passed with the Moses laser set on the dusting setting.  The stone was then broken and imaginable fragments which were removed using the engage basket as they fragmentation progress.  Once the ureteral stone had been managed the ureteroscope was removed and the working wire was replaced to the kidney.  The 36 cm access sheath was replaced with a 55 cm access sheath.  The inner core and wire were removed and the ureteroscope was then replaced.  The 9 mm stone was then identified in a midpole calyx and fragmented with the laser.  The larger fragments were removed leaving only dust and sand in the kidney.  An additional 4 mm stone was noted in the lower pole and this was retrieved, fragmented and removed.  Once final endoscopic and fluoroscopic visualization demonstrated no significant stone fragments, the ureteroscope was removed along with the sheath.  The cystoscope was then reinserted over the safety wire and a 6 [place] by 26 cm contour double-J stent without tether was passed the kidney under fluoroscopic guidance.  The wire was removed, leaving good coil  in the kidney and a good coil in the bladder.  The bladder was drained and the cystoscope was removed.  A Foley catheter was not replaced.  He was taken down from lithotomy position, his anesthetic was reversed and he was moved recovery in stable condition.  A portion of the stone fragments were sent to the lab for analysis and the remainder will be given to the  patient.  There were no complications.

## 2023-03-12 NOTE — Plan of Care (Signed)

## 2023-03-12 NOTE — TOC Progression Note (Signed)
 Transition of Care (TOC) - Progression Note    Patient Details  Name: [name]. MRN: [id number] Date of Birth: 09/08/1935  Transition of Care [facility]) CM/SW Contact  Otelia Santee, LCSW Phone Number: 03/12/2023, 11:07 AM  Clinical Narrative:    Met with pt and confirmed plan for [name] at discharge. Pt prefers to have [facility] services with [facility] as he reports receiving HH aide through them. Per Frances Furbish they do not have record of pt being active with their home health aide services and report pt may be privately paying for this through their agency. HHPT/OT has been arranged with [facility]. HH orders will need to be placed prior to discharge. No DME needs identified.   Expected Discharge Plan: Home w Home Health Services Barriers to Discharge: Continued Medical Work up  Expected Discharge Plan and Services   Discharge Planning Services: CM Consult   Living arrangements for the past 2 months: Single Family Home                                       Social Determinants of Health (SDOH) Interventions SDOH Screenings   Food Insecurity: No Food Insecurity (03/10/2023)  Housing: Low Risk  (03/10/2023)  Transportation Needs: No Transportation Needs (03/10/2023)  Utilities: Not At Risk (03/10/2023)  Alcohol Screen: Low Risk  (06/02/2021)  Depression (PHQ2-9): Low Risk  (06/16/2021)  Financial Resource Strain: Low Risk  (06/16/2021)  Social Connections: Unknown (03/10/2023)  Stress: No Stress Concern Present (06/16/2021)  Tobacco Use: Low Risk  (03/09/2023)    Readmission Risk Interventions    03/12/2023   11:06 AM 02/11/2023    4:00 PM 11/10/2021    3:54 PM  Readmission Risk Prevention Plan  Transportation Screening Complete Complete Complete  PCP or Specialist Appt within 3-5 Days Complete Not Complete Complete  Not Complete comments  Patient will discharge to SNF.   HRI or Home Care Consult Complete Complete Complete  Social Work Consult for Recovery Care Planning/Counseling Complete  Complete Complete  Palliative Care Screening Not Applicable Not Applicable Not Applicable  Medication Review (RN Care Manager) Complete Complete Complete

## 2023-03-12 NOTE — Anesthesia Postprocedure Evaluation (Signed)
 Anesthesia Post Note  Patient: Holger Sokolowski.  Procedure(s) Performed: CYSTOSCOPY/RIGHT URETEROSCOPY/HOLMIUM LASER/STENT PLACEMENT AND RETROGRADE PYELOGRAM (Right: Pelvis)     Patient location during evaluation: PACU Anesthesia Type: General Level of consciousness: awake and alert Pain management: pain level controlled Vital Signs Assessment: post-procedure vital signs reviewed and stable Respiratory status: spontaneous breathing, nonlabored ventilation, respiratory function stable and patient connected to nasal cannula oxygen Cardiovascular status: blood pressure returned to baseline and stable Postop Assessment: no apparent nausea or vomiting Anesthetic complications: no   No notable events documented.  Last Vitals:  Vitals:   03/12/23 0930 03/12/23 0948  BP: 135/72 131/69  Pulse: 77 72  Resp: 15 18  Temp:    SpO2: 96% 96%    Last Pain:  Vitals:   03/12/23 0948  TempSrc:   PainSc: 0-No pain                 Belanna Manring

## 2023-03-12 NOTE — Anesthesia Procedure Notes (Signed)
 Procedure Name: Intubation Date/Time: 03/12/2023 7:50 AM  Performed by: Floydene Flock, CRNAPre-anesthesia Checklist: Patient identified, Emergency Drugs available, Suction available and Patient being monitored Patient Re-evaluated:Patient Re-evaluated prior to induction Oxygen Delivery Method: Circle system utilized Preoxygenation: Pre-oxygenation with 100% oxygen Induction Type: IV induction Ventilation: Mask ventilation without difficulty Laryngoscope Size: Mac and 4 Grade View: Grade I Tube type: Oral Tube size: 7.5 mm Number of attempts: 1 Airway Equipment and Method: Stylet Placement Confirmation: positive ETCO2, ETT inserted through vocal cords under direct vision and breath sounds checked- equal and bilateral Secured at: 22 cm Tube secured with: Tape Dental Injury: Teeth and Oropharynx as per pre-operative assessment  Comments: Pt has a crack in the right corner of his mouth with small amount of bleeding noted prior to positive pressure mask ventilation or intubation. Salve applied to lips for dryness post intubation.

## 2023-03-12 NOTE — Progress Notes (Signed)
 PHARMACY - ANTICOAGULATION CONSULT NOTE  Pharmacy Consult for warfarin Indication: valvular Afib  Allergies  Allergen Reactions   Sulfa Antibiotics Other (See Comments)    Granulocytosis   Cefepime Other (See Comments)    encephalopathy   Sulfamethoxazole-Trimethoprim     Listed on MAR   Zestril [Lisinopril] Cough    Patient Measurements: Height: 6' (182.9 cm) Weight: 73.2 kg (161 lb 6 oz) IBW/kg (Calculated) : 77.6  Vital Signs: Temp: 98 F (36.7 C) (03/04 1202) BP: 131/71 (03/04 1202) Pulse Rate: 83 (03/04 1202)  Labs: Recent Labs    03/09/23 2048 03/10/23 0600 03/12/23 1022  HGB 9.6* 7.9* 8.2*  HCT 31.5* 26.5* 27.9*  PLT 175 146* 179  APTT 49*  --   --   LABPROT 19.9* 20.5*  --   INR 1.7* 1.7*  --   CREATININE 1.08 1.08 0.95    Estimated Creatinine Clearance: 58.9 mL/min (by C-G formula based on SCr of 0.95 mg/dL).   Medical History: Past Medical History:  Diagnosis Date   Acquired dilation of ascending aorta and aortic root (HCC)    43mm aortic root and 41mm ascending aorta on echo 03/2020   Anemia    Arthritis    Bladder stones    Borderline diabetes    BPH (benign prostatic hyperplasia)    CAD (coronary artery disease) 12/19/2020   Non-obstructive coronary artery disease at cath in 2019   Complication of anesthesia    problem with voiding after anesthesia,   Coronary artery disease    cardiologist-  dr Rockey Situ gerhart NP--- per cath 06-02-2010 non-obstructive cad pLAD 30-40%   CVA (cerebral vascular accident) (HCC) 10/24/2016   Diverticulosis of colon    Dysrhythmia    afib   Gout    Heart murmur    History of adenomatous polyp of colon    2002-- tubular adenoma   History of aortic insufficiency    severe -- s/p  AVR 08-03-2010   History of small bowel obstruction    02/ 2007 mechanical sbo s/p  surgical intervention;  partial sbo 09/ 2011 and 03-20-2011 resolved without surgical intervention   History of urinary retention    HTN  (hypertension)    Lower extremity edema    Other secondary pulmonary hypertension (HCC) 03/20/2022   TTE 03/19/22: EF 65-70, no RWMA, moderate LVH, normal RVSF, severe pulmonary hypertension (RVSP 60.4), severe BAE, normal structure and function of mitral valve repair (mean gradient 9), mild TR, trivial AI, normal structure and function of AVR (mean 15.4), moderate PI, aortic root and ascending aorta 40 mm, RAP 15   Peripheral neuropathy    Persistent atrial fibrillation (HCC)    Pre-diabetes    S/P aortic valve replacement with prosthetic valve 08/03/2010   tissue valve   S/P Maze operation for atrial fibrillation 01/30/2018   Complete bilateral atrial lesion set using bipolar radiofrequency and cryothermy with clipping of LA appendage   S/P MVR (mitral valve repair) 01/30/2018   Complex valvuloplasty including artificial Gore-tex neochord placement x4 and Carbo medics Annuloflex ring annuloplasty, size 28   S/P patent foramen ovale closure 08/03/2010   at same time AVR   S/P tricuspid valve repair 01/30/2018   Using an MC3 Annuloplasty ring, size 28   Stroke (HCC)    Thrombocytopenia (HCC)    Tricuspid regurgitation     Medications:  Medications Prior to Admission  Medication Sig Dispense Refill Last Dose/Taking   allopurinol (ZYLOPRIM) 300 MG tablet Take 150 mg by  mouth every morning.   Past Week   amLODipine (NORVASC) 10 MG tablet TAKE 1 TABLET BY MOUTH DAILY 100 tablet 3 Past Week   atenolol (TENORMIN) 25 MG tablet TAKE ONE-HALF TABLET BY MOUTH  DAILY 50 tablet 3 Past Week   cholecalciferol (VITAMIN D3) 25 MCG (1000 UNIT) tablet Take 1,000 Units by mouth every evening.   Past Week   Cinnamon 500 MG capsule Take 500 mg by mouth daily.   Past Week   ferrous sulfate 325 (65 FE) MG EC tablet Take 325 mg by mouth 2 (two) times daily.   Past Week   finasteride (PROSCAR) 5 MG tablet Take 1 tablet (5 mg total) by mouth daily. 30 tablet 0 Past Week   magnesium hydroxide (MILK OF MAGNESIA)  400 MG/5ML suspension Take 30 mLs by mouth daily as needed for mild constipation.   Taking As Needed   Multiple Vitamins-Minerals (CENTRUM SILVER ADULT 50+) TABS Take 1 tablet by mouth daily.   Past Week   polyethylene glycol (MIRALAX / GLYCOLAX) 17 g packet Take 17 g by mouth daily as needed for moderate constipation.   Taking As Needed   rosuvastatin (CRESTOR) 10 MG tablet TAKE 1 TABLET BY MOUTH DAILY 100 tablet 3 Past Week   senna-docusate (SENOKOT-S) 8.6-50 MG tablet Take 2 tablets by mouth daily.   Past Week   traZODone (DESYREL) 50 MG tablet Take 25 mg by mouth at bedtime as needed for sleep.   Taking As Needed   warfarin (COUMADIN) 5 MG tablet TAKE 1 TO 1 AND 1/2 TABLETS BY  MOUTH DAILY OR AS DIRECTED BY  ANTICOAGULATION CLINIC (Patient taking differently: Take 5-7.5 mg by mouth as directed. Take 1 tablet (5 mg) daily except on Mondays Take 1.5 tablets (7.5 mg)) 100 tablet 4 03/06/2023   Scheduled:   Chlorhexidine Gluconate Cloth  6 each Topical Daily   feeding supplement  237 mL Oral BID BM   finasteride  5 mg Oral Daily   polyethylene glycol  17 g Oral Daily   rosuvastatin  10 mg Oral Daily   sodium chloride flush  3 mL Intravenous Q12H   tamsulosin  0.4 mg Oral Daily   PRN: acetaminophen **OR** acetaminophen, ondansetron **OR** ondansetron (ZOFRAN) IV, senna-docusate, traZODone  Assessment: 88 yoM with PMH valvular Afib (bioprosthetic AV/MV; TV repair) on chronic warfarin, R ureteral stone s/p stenting in January, admitted 3/1 for sepsis d/t E coli bacteremia resulting from retained stone. Today, 3/4, pt is s/p stone extraction and stent exchange; Pharmacy consulted to resume warfarin tonight.  Baseline INR 1.7 on 3/2  Prior anticoagulation: warfarin 5 mg PO daily, except 7.5 mg qMon LD 5 mg on 3/2 (after INR resulted that morning)  Significant events:  Today, 03/12/2023: CBC: Hgb low but stable/improved after surgery; Plt stable WNL INR SUBtherapeutic on 3/2, but did receive 5  mg after this reading, with no subsequent recheck  Major drug interactions: none noted No bleeding issues per nursing Meal intake not fully charted  Goal of Therapy: INR 2-3  Plan: Warfarin 5 mg PO tonight at 16:00 Daily INR CBC at least q72 hr while on warfarin Monitor for signs of bleeding or thrombosis   Bernadene Person, PharmD, BCPS [phone] 03/12/2023, 3:05 PM

## 2023-03-12 NOTE — Progress Notes (Signed)
 Progress Note   Patient: [name]. FAO:[id number] DOB: [birth year] DOA: 03/09/2023     2 DOS: the patient was seen and examined on 03/12/2023   Brief hospital course: 88 year old man PMH including A-fib, valvular heart disease, recent right ureteral stone status post stent late January, indwelling Foley catheter, presented with nausea, vomiting and generalized weakness.  Admitted for sepsis, bacteremia, catheter associated UTI.  Treated with empiric antibiotics with gradual clinical improvement.  Underwent cystoscopy with stone removal and stent exchange, Foley catheter removed.  If does well over the next 24 hours would anticipate discharge.   Recently admitted 1/25-2/14 for multiple issues including Enterobacter cloacae UTI/bacteremia, right ureteral calculus s/p stenting on 1/30, ileus, AKI, anemia and thrombocytopenia (received 2 units platelets, 1 unit FFP, 4 units PRBCs), and small acute left frontal and left temporal white matter infarcts thought to be related to FFP/platelet infusion.  MRA showed severe stenosis of right P2 PCA.  Consultants Urology   Procedures/Events 3/4 1.  Cystoscopy with right ureteroscopy with holmium laser application, stone extraction and stent exchange for right proximal stone. 2.  Right ureteroscopy with holmium laser application and stone extraction for right renal stone. 3.  Application of fluoroscopy.   Assessment and Plan: Sepsis secondary to [name] associated with chronic indwelling Foley catheter E. coli bacteremia PMH right ureteral stone s/p stent placement 02/07/2023 BPH From SNF.   CT showed malpositioned Foley catheter, resolution of prior right hydronephrosis s/p ureteral stent placement and migration of 7 mm stone now in the proximal ureter. Foley catheter exchanged in the emergency department. Clinically improving.  No oral antibiotics given culture data. Continue Proscar and Flomax.   Permanent atrial fibrillation S/p maze  2020 S/p bioprosthetic AVR, MV and TV repair: Remains in atrial fibrillation.  Intermittent mild bradycardic episodes.  INR is 1.7. Holding atenolol given bradycardia Resume warfarin per pharmacy   Chronic HFpEF with pulmonary HTN: Not on diuretics as an outpatient.  Appears stable.  Holding beta-blocker as above.   Recent CVA Continue rosuvastatin  Resume warfarin    Anemia of chronic disease: Hemoglobin stable at 9.6.   Hypertension Stable.  Can resume atenolol and amlodipine on discharge.  Likely home with home health in the next 48 hours.    Subjective:  Feels better  Physical Exam: Vitals:   03/12/23 0930 03/12/23 0948 03/12/23 1004 03/12/23 1202  BP: 135/72 131/69 138/71 131/71  Pulse: 77 72 78 83  Resp: 15 18 17 15   Temp:   97.6 F (36.4 C) 98 F (36.7 C)  TempSrc:      SpO2: 96% 96% 98% 98%  Weight:      Height:       Physical Exam Vitals reviewed.  Constitutional:      General: He is not in acute distress.    Appearance: He is not ill-appearing or toxic-appearing.  Cardiovascular:     Rate and Rhythm: Normal rate and regular rhythm.     Heart sounds: No murmur heard. Pulmonary:     Effort: Pulmonary effort is normal. No respiratory distress.     Breath sounds: No wheezing, rhonchi or rales.  Neurological:     Mental Status: He is alert.  Psychiatric:        Mood and Affect: Mood normal.        Behavior: Behavior normal.     Data Reviewed: BMP unremarkable Hemoglobin stable 8.2 Platelets up to 179  Family Communication: none  Disposition: Status is: Inpatient Remains inpatient  appropriate because: s/p stent replacement     Time spent: 20 minutes  Author: Brendia Sacks, MD 03/12/2023 1:38 PM  For on call review [email or web address].

## 2023-03-12 NOTE — Interval H&P Note (Signed)
 History and Physical Interval Note: No change  03/12/2023 7:07 AM  [name].  has presented today for surgery, with the diagnosis of RIGHT URETERAL STONE.  The various methods of treatment have been discussed with the patient and family. After consideration of risks, benefits and other options for treatment, the patient has consented to  Procedure(s) with comments: CYSTOSCOPY/RIGHT URETEROSCOPY/HOLMIUM LASER/STENT PLACEMENT AND RETROGRADE PYELOGRAM (Right) - 60  MINUTE CASE as a surgical intervention.  The patient's history has been reviewed, patient examined, no change in status, stable for surgery.  I have reviewed the patient's chart and labs.  Questions were answered to the patient's satisfaction.     Bjorn Pippin

## 2023-03-13 ENCOUNTER — Encounter (HOSPITAL_COMMUNITY): Payer: Self-pay | Admitting: Urology

## 2023-03-13 DIAGNOSIS — A419 Sepsis, unspecified organism: Secondary | ICD-10-CM | POA: Diagnosis not present

## 2023-03-13 LAB — BASIC METABOLIC PANEL
Anion gap: 15 (ref 5–15)
BUN: 20 mg/dL (ref 8–23)
CO2: 18 mmol/L — ABNORMAL LOW (ref 22–32)
Calcium: 8.6 mg/dL — ABNORMAL LOW (ref 8.9–10.3)
Chloride: 107 mmol/L (ref 98–111)
Creatinine, Ser: 0.73 mg/dL (ref 0.61–1.24)
GFR, Estimated: >60 mL/min (ref >60)
Glucose, Bld: 189 mg/dL — ABNORMAL HIGH (ref 70–99)
Potassium: 4 mmol/L (ref 3.5–5.1)
Sodium: 140 mmol/L (ref 135–145)

## 2023-03-13 LAB — CBC
HCT: 27.9 % — ABNORMAL LOW (ref 39.0–52.0)
Hemoglobin: 8.1 g/dL — ABNORMAL LOW (ref 13.0–17.0)
MCH: 28 pg (ref 26.0–34.0)
MCHC: 29 g/dL — ABNORMAL LOW (ref 30.0–36.0)
MCV: 96.5 fL (ref 80.0–100.0)
Platelets: 177 10*3/uL (ref 150–400)
RBC: 2.89 MIL/uL — ABNORMAL LOW (ref 4.22–5.81)
RDW: 16.2 % — ABNORMAL HIGH (ref 11.5–15.5)
WBC: 9.1 10*3/uL (ref 4.0–10.5)
nRBC: 0 % (ref 0.0–0.2)

## 2023-03-13 LAB — URINE CULTURE: Culture: >=100000 — AB

## 2023-03-13 LAB — PROTIME-INR
INR: 1.9 — ABNORMAL HIGH (ref 0.8–1.2)
Prothrombin Time: 22.1 s — ABNORMAL HIGH (ref 11.4–15.2)

## 2023-03-13 LAB — MAGNESIUM: Magnesium: 2.2 mg/dL (ref 1.7–2.4)

## 2023-03-13 MED ORDER — PIPERACILLIN-TAZOBACTAM 3.375 G IVPB
3.3750 g | Freq: Three times a day (TID) | INTRAVENOUS | Status: DC
  Administered 2023-03-13 – 2023-03-14 (×3): 3.375 g via INTRAVENOUS
  Filled 2023-03-13 (×3): qty 50

## 2023-03-13 MED ORDER — WARFARIN - PHARMACIST DOSING INPATIENT: Freq: Every day | Status: DC

## 2023-03-13 MED ORDER — WARFARIN SODIUM 5 MG PO TABS
5.0000 mg | ORAL_TABLET | Freq: Once | ORAL | Status: AC
  Administered 2023-03-13: 5 mg via ORAL
  Filled 2023-03-13: qty 1

## 2023-03-13 NOTE — Plan of Care (Signed)
  Problem: Education: Goal: Knowledge of General Education information will improve Description: Including pain rating scale, medication(s)/side effects and non-pharmacologic comfort measures Outcome: Progressing   Problem: Clinical Measurements: Goal: Ability to maintain clinical measurements within normal limits will improve Outcome: Progressing   Problem: Activity: Goal: Risk for activity intolerance will decrease Outcome: Progressing   Problem: Nutrition: Goal: Adequate nutrition will be maintained Outcome: Progressing   Problem: Elimination: Goal: Will not experience complications related to bowel motility Outcome: Progressing Goal: Will not experience complications related to urinary retention Outcome: Progressing   Problem: Pain Managment: Goal: General experience of comfort will improve and/or be controlled Outcome: Progressing

## 2023-03-13 NOTE — Plan of Care (Signed)
   Problem: Nutrition: Goal: Adequate nutrition will be maintained Outcome: Progressing   Problem: Safety: Goal: Ability to remain free from injury will improve Outcome: Progressing   Problem: Skin Integrity: Goal: Risk for impaired skin integrity will decrease Outcome: Progressing

## 2023-03-13 NOTE — Progress Notes (Signed)
 1 Day Post-Op  Subjective: [name] is doing well s/p ureteroscopy and is voiding with an acceptable PVR.   ROS:  Review of Systems  Respiratory:  Negative for shortness of breath.   Cardiovascular:  Negative for chest pain.    Anti-infectives: Anti-infectives (From admission, onward)    Start     Dose/Rate Route Frequency Ordered Stop   03/12/23 2200  ceFAZolin (ANCEF) IVPB [name]        2 g 200 mL/hr over 30 Minutes Intravenous Every 8 hours 03/12/23 1235     03/12/23 0600  ceFAZolin (ANCEF) IVPB [name]  Status:  Discontinued        2 g 200 mL/hr over 30 Minutes Intravenous 30 min pre-op 03/12/23 0600 03/12/23 0955   03/10/23 2200  cefTRIAXone (ROCEPHIN) 2 g in sodium chloride 0.9 % 100 mL IVPB  Status:  Discontinued        2 g 200 mL/hr over 30 Minutes Intravenous Every 24 hours 03/10/23 1740 03/12/23 1234   03/10/23 1000  meropenem (MERREM) 1 g in sodium chloride 0.9 % 100 mL IVPB  Status:  Discontinued        1 g 200 mL/hr over 30 Minutes Intravenous Every 8 hours 03/10/23 0133 03/10/23 1739   03/10/23 0000  meropenem (MERREM) 1 g in sodium chloride 0.9 % 100 mL IVPB        1 g 200 mL/hr over 30 Minutes Intravenous  Once 03/09/23 2356 03/10/23 0319       Current Facility-Administered Medications  Medication Dose Route Frequency Provider Last Rate Last Admin   acetaminophen (TYLENOL) tablet 650 mg  650 mg Oral Q6H PRN Charlsie Quest, MD       Or   acetaminophen (TYLENOL) suppository 650 mg  650 mg Rectal Q6H PRN Charlsie Quest, MD       ceFAZolin (ANCEF) IVPB [name]  2 g Intravenous Q8H Standley Brooking, MD 200 mL/hr at 03/13/23 0524 2 g at 03/13/23 0524   Chlorhexidine Gluconate Cloth 2 % PADS 6 each  6 each Topical Daily Standley Brooking, MD   6 each at 03/13/23 0855   feeding supplement (ENSURE ENLIVE / ENSURE PLUS) liquid 237 mL  237 mL Oral BID BM Standley Brooking, MD   237 mL at 03/13/23 0855   finasteride (PROSCAR) tablet 5  mg  5 mg Oral Daily Darreld Mclean R, MD   5 mg at 03/13/23 0854   ondansetron (ZOFRAN) tablet 4 mg  4 mg Oral Q6H PRN Charlsie Quest, MD       Or   ondansetron (ZOFRAN) injection 4 mg  4 mg Intravenous Q6H PRN Charlsie Quest, MD       polyethylene glycol (MIRALAX / GLYCOLAX) packet 17 g  17 g Oral Daily Darreld Mclean R, MD       rosuvastatin (CRESTOR) tablet 10 mg  10 mg Oral Daily Darreld Mclean R, MD   10 mg at 03/13/23 0855   senna-docusate (Senokot-S) tablet 1 tablet  1 tablet Oral QHS PRN Darreld Mclean R, MD       sodium chloride flush (NS) 0.9 % injection 3 mL  3 mL Intravenous Q12H Darreld Mclean R, MD   3 mL at 03/13/23 0856   tamsulosin (FLOMAX) capsule 0.4 mg  0.4 mg Oral Daily Darreld Mclean R, MD   0.4 mg at 03/13/23 0854   traZODone (DESYREL) tablet 25 mg  25 mg Oral  QHS PRN Charlsie Quest, MD   25 mg at 03/11/23 2111   warfarin (COUMADIN) tablet 5 mg  5 mg Oral ONCE-1600 Wofford, Drew A, RPH         Objective: Vital signs in last 24 hours: Temp:  [97.5 F (36.4 C)-98 F (36.7 C)] 97.8 F (36.6 C) (03/05 0515) Pulse Rate:  [71-83] 71 (03/05 0515) Resp:  [15-18] 18 (03/05 0515) BP: (131-147)/(69-82) 147/82 (03/05 0515) SpO2:  [96 %-99 %] 99 % (03/05 0515) Weight:  [75 kg] 75 kg (03/05 0500)  Intake/Output from previous day: 03/04 0701 - 03/05 0700 In: 1635 [P.O.:835; I.V.:700; IV Piggyback:100] Out: 1660 [Urine:1660] Intake/Output this shift: No intake/output data recorded.   Physical Exam Vitals reviewed.  Constitutional:      Appearance: Normal appearance.  Neurological:     Mental Status: He is alert.     Lab Results:  Recent Labs    03/12/23 1022  WBC 6.4  HGB 8.2*  HCT 27.9*  PLT 179   BMET Recent Labs    03/12/23 1022  NA 145  K 3.9  CL 111  CO2 25  GLUCOSE 142*  BUN 22  CREATININE 0.95  CALCIUM 8.6*   PT/INR Recent Labs    03/13/23 0555  LABPROT 22.1*  INR 1.9*   ABG No results for input(s): "PHART", "HCO3" in the last 72  hours.  Invalid input(s): "PCO2", "PO2"  Studies/Results: DG C-Arm 1-60 Min-No Report Result Date: 03/12/2023 Fluoroscopy was utilized by the requesting physician.  No radiographic interpretation.   DG C-Arm 1-60 Min-No Report Result Date: 03/12/2023 Fluoroscopy was utilized by the requesting physician.  No radiographic interpretation.     Assessment and Plan: Right renal and ureteral stones.  He is doing well s/p ureteroscopy.   He will be arranged for f/u stent removal in about a week in the office.    2.   Right renal cyst with hemorrhage.   We will need to reassess this after he is recovered from the ureteroscopy.   3.   BPH with BOO.Marland Kitchen   He is on tamsulosin and finasteride and is voiding adequately since foley removal.     LOS: 3 days    [name] 3/5/2025Patient ID: Gerald Lou., male   DOB: 10-12-1935, 88 y.o.   MRN: [id number] Patient ID: Gerald Lou., male   DOB: 1935/05/11, 88 y.o.   MRN: [id number]

## 2023-03-13 NOTE — Progress Notes (Addendum)
 PROGRESS NOTE    [name].  WUJ:[id number] DOB: 1935-12-01 DOA: 03/09/2023 PCP: Gerald Soho, PA-C  Chief Complaint  Patient presents with   Weakness    Brief Narrative:   88 year old Gerald PMH including [name]-fib, valvular heart disease, and multiple other medical issues who had [name] recent admission with bacteremia in the setting of an obstructing stone now s/p stent placement in late January.  He presented with nausea, vomiting, and generalized weakness.  Was found to have e. Coli bacteremia.  He's now s/p cystoscopy with holmium laser application, stone extraction, and stent exchange.    Assessment & Plan:   Principal Problem:   Sepsis (HCC) Active Problems:   Urinary tract infection associated with indwelling urethral catheter (HCC)   Essential hypertension   Permanent atrial fibrillation (HCC)   Benign prostatic hyperplasia with lower urinary tract symptoms   History of ischemic right MCA stroke   Long term current use of anticoagulant   Anemia of chronic disease   Bacteremia   Sepsis secondary to [name] associated with chronic indwelling Foley catheter E. coli bacteremia PMH right ureteral stone s/p stent placement 02/07/2023 BPH CT showed malpositioned Foley catheter, resolution of prior right hydronephrosis s/p ureteral stent placement and migration of 7 mm stone now in the proximal ureter. He's now s/p cystoscopy with R ureteroscopy with holmium laser application, stone extraction and stent exchange - R ureteroscopy with holmium laser application and stone extraction for right renal stone Foley catheter discontinued Continue Proscar and Flomax. Appreciate urology eval -> needs follow up stent removal with urology (in 1 week at Urology office) Blood cultures with e. Coli Urine culture with e. Coli and enterobacter asburiae  -> will cover both with abx -> EKG with prolonged Qtc, will follow lytes and repeat EKG, cipro maybe the best option to cover both    Permanent atrial fibrillation S/p maze 2020 S/p bioprosthetic AVR, MV and TV repair: Remains in atrial fibrillation.  Intermittent mild bradycardic episodes.  INR is 1.9. Holding atenolol for now given bradycardia Resume warfarin per pharmacy   Chronic HFpEF with pulmonary HTN: Not on diuretics as an outpatient.  Appears stable.  Holding beta-blocker as above.   Recent CVA Continue rosuvastatin  Resume warfarin    Anemia of chronic disease: Hemoglobin stable at 9.6.   Hypertension Atenolol and amlodipine currently on hold   BPH Finasteride, flomax Foley has been discontinued  Right Renal Cyst with Hemorrhage Reeval with urology outpatient    DVT prophylaxis: warfarin Code Status: DNR Family Communication: none - called son, no answer Disposition:   Status is: Inpatient Remains inpatient appropriate because: need for ongoing care, antibiotics   Consultants:  urology  Procedures:  3/4 Procedure:  1.  Cystoscopy with right ureteroscopy with holmium laser application, stone extraction and stent exchange for right proximal stone. 2.  Right ureteroscopy with holmium laser application and stone extraction for right renal stone. 3.  Application of fluoroscopy.  Antimicrobials:  Anti-infectives (From admission, onward)    Start     Dose/Rate Route Frequency Ordered Stop   03/13/23 1300  piperacillin-tazobactam (ZOSYN) IVPB 3.375 g        3.375 g 12.5 mL/hr over 240 Minutes Intravenous Every 8 hours 03/13/23 1158     03/12/23 2200  ceFAZolin (ANCEF) IVPB [name]  Status:  Discontinued        2 g 200 mL/hr over 30 Minutes Intravenous Every 8 hours 03/12/23 1235 03/13/23 1156   03/12/23 0600  ceFAZolin (ANCEF) IVPB [name]  Status:  Discontinued        2 g 200 mL/hr over 30 Minutes Intravenous 30 min pre-op 03/12/23 0600 03/12/23 0955   03/10/23 2200  cefTRIAXone (ROCEPHIN) 2 g in sodium chloride 0.9 % 100 mL IVPB  Status:  Discontinued        2  g 200 mL/hr over 30 Minutes Intravenous Every 24 hours 03/10/23 1740 03/12/23 1234   03/10/23 1000  meropenem (MERREM) 1 g in sodium chloride 0.9 % 100 mL IVPB  Status:  Discontinued        1 g 200 mL/hr over 30 Minutes Intravenous Every 8 hours 03/10/23 0133 03/10/23 1739   03/10/23 0000  meropenem (MERREM) 1 g in sodium chloride 0.9 % 100 mL IVPB        1 g 200 mL/hr over 30 Minutes Intravenous  Once 03/09/23 2356 03/10/23 0319       Subjective: No complaints  Objective: Vitals:   03/12/23 2245 03/13/23 0500 03/13/23 0515 03/13/23 1303  BP: 136/77  (!) 147/82 (!) 143/72  Pulse: 81  71 80  Resp: 18  18   Temp: 97.8 F (36.6 C)  97.8 F (36.6 C)   TempSrc:   Oral   SpO2: 97%  99% 96%  Weight:  75 kg    Height:        Intake/Output Summary (Last 24 hours) at 03/13/2023 1329 Last data filed at 03/13/2023 1006 Gross per 24 hour  Intake 1055 ml  Output 1660 ml  Net -605 ml   Filed Weights   03/11/23 0606 03/12/23 0500 03/13/23 0500  Weight: 73.1 kg 73.2 kg 75 kg    Examination:  General exam: Appears calm and comfortable, sitting up in chair Respiratory system: unlabored Cardiovascular system: RRR Gastrointestinal system: Abdomen is nondistended, soft and nontender.  Central nervous system: Alert and oriented. No focal neurological deficits. Extremities: no LEE    Data Reviewed: I have personally reviewed following labs and imaging studies  CBC: Recent Labs  Lab 03/09/23 2048 03/10/23 0600 03/12/23 1022 03/13/23 0917  WBC 13.7* 12.3* 6.4 9.1  NEUTROABS 11.8*  --   --   --   HGB 9.6* 7.9* 8.2* 8.1*  HCT 31.5* 26.5* 27.9* 27.9*  MCV 91.6 93.0 95.2 96.5  PLT 175 146* 179 177    Basic Metabolic Panel: Recent Labs  Lab 03/09/23 2048 03/10/23 0600 03/12/23 1022 03/13/23 0917  NA 141 143 145 140  K 3.7 3.7 3.9 4.0  CL 108 112* 111 107  CO2 22 22 25  18*  GLUCOSE 182* 142* 142* 189*  BUN 18 19 22 20   CREATININE 1.08 1.08 0.95 0.73  CALCIUM 8.9 8.4*  8.6* 8.6*  MG  --   --   --  2.2    GFR: Estimated Creatinine Clearance: 71.6 mL/min (by C-G formula based on SCr of 0.73 mg/dL).  Liver Function Tests: Recent Labs  Lab 03/09/23 2048  AST 26  ALT 19  ALKPHOS 78  BILITOT 1.4*  PROT 8.5*  ALBUMIN 3.2*    CBG: No results for input(s): "GLUCAP" in the last 168 hours.   Recent Results (from the past 240 hours)  Blood Culture (routine x 2)     Status: Abnormal   Collection Time: 03/09/23  8:40 PM   Specimen: BLOOD LEFT ARM  Result Value Ref Range Status   Specimen Description   Final    BLOOD LEFT ARM Performed at [facility], [address], [place], [place] [address]    Special Requests   Final    BOTTLES DRAWN AEROBIC AND ANAEROBIC Blood Culture results may not be optimal due to an inadequate volume of blood received in culture bottles Performed at [facility], [address] [place], [place] [address]    Culture  Setup Time   Final    GRAM NEGATIVE RODS IN BOTH AEROBIC AND ANAEROBIC BOTTLES CRITICAL VALUE NOTED.  VALUE IS CONSISTENT WITH PREVIOUSLY REPORTED AND CALLED VALUE.    Culture ([name])  Final    ESCHERICHIA COLI SUSCEPTIBILITIES PERFORMED ON PREVIOUS CULTURE WITHIN THE LAST 5 DAYS. Performed at [facility], [address] [place], [place] [address]    Report Status 03/12/2023 FINAL  Final  Blood Culture (routine x 2)     Status: Abnormal   Collection Time: 03/09/23  8:45 PM   Specimen: BLOOD  Result Value Ref Range Status   Specimen Description   Final    BLOOD LEFT ANTECUBITAL Performed at [facility], [address] [place], [place] [address]    Special Requests   Final    BOTTLES DRAWN AEROBIC AND ANAEROBIC Blood Culture adequate volume Performed at [facility], [address] [place], [place] [address]    Culture  Setup Time   Final    GRAM NEGATIVE RODS AEROBIC BOTTLE ONLY CRITICAL RESULT CALLED TO, READ BACK BY AND  VERIFIED WITH: PHARMD JUSTIN LEGGE [phone] AT 1730 BY EC Performed at [facility], [address] [place], [place] [address]    Culture ESCHERICHIA COLI ([name])  Final   Report Status 03/12/2023 FINAL  Final   Organism ID, Bacteria ESCHERICHIA COLI  Final   Organism ID, Bacteria ESCHERICHIA COLI  Final      Susceptibility   Escherichia coli - KIRBY BAUER*    CEFAZOLIN SENSITIVE Sensitive    Escherichia coli - MIC*    AMPICILLIN <=2 SENSITIVE Sensitive     CEFEPIME <=0.12 SENSITIVE Sensitive     CEFTAZIDIME <=1 SENSITIVE Sensitive     CEFTRIAXONE <=0.25 SENSITIVE Sensitive     CIPROFLOXACIN <=0.25 SENSITIVE Sensitive     GENTAMICIN <=1 SENSITIVE Sensitive     IMIPENEM <=0.25 SENSITIVE Sensitive     TRIMETH/SULFA <=20 SENSITIVE Sensitive     AMPICILLIN/SULBACTAM <=2 SENSITIVE Sensitive     PIP/TAZO <=4 SENSITIVE Sensitive ug/mL    * ESCHERICHIA COLI    ESCHERICHIA COLI  Blood Culture ID Panel (Reflexed)     Status: Abnormal   Collection Time: 03/09/23  8:45 PM  Result Value Ref Range Status   Enterococcus faecalis NOT DETECTED NOT DETECTED Final   Enterococcus Faecium NOT DETECTED NOT DETECTED Final   Listeria monocytogenes NOT DETECTED NOT DETECTED Final   Staphylococcus species NOT DETECTED NOT DETECTED Final   Staphylococcus aureus (BCID) NOT DETECTED NOT DETECTED Final   Staphylococcus epidermidis NOT DETECTED NOT DETECTED Final   Staphylococcus lugdunensis NOT DETECTED NOT DETECTED Final   Streptococcus species NOT DETECTED NOT DETECTED Final   Streptococcus agalactiae NOT DETECTED NOT DETECTED Final   Streptococcus pneumoniae NOT DETECTED NOT DETECTED Final   Streptococcus pyogenes NOT DETECTED NOT DETECTED Final   Mars Scheaffer.calcoaceticus-baumannii NOT DETECTED NOT DETECTED Final   Bacteroides fragilis NOT DETECTED NOT DETECTED Final   Enterobacterales DETECTED ([name]) NOT DETECTED Final    Comment: Enterobacterales represent [name] large order of gram negative bacteria, not Symphanie Cederberg single  organism. CRITICAL RESULT CALLED TO, READ  BACK BY AND VERIFIED WITH: PHARMD JUSTIN LEGGE [id number] AT 1730 BY EC    Enterobacter cloacae complex NOT DETECTED NOT DETECTED Final   Escherichia coli DETECTED ([name]) NOT DETECTED Final    Comment: CRITICAL RESULT CALLED TO, READ BACK BY AND VERIFIED WITH: PHARMD JUSTIN LEGGE [phone] AT 1730 BY EC    Klebsiella aerogenes NOT DETECTED NOT DETECTED Final   Klebsiella oxytoca NOT DETECTED NOT DETECTED Final   Klebsiella pneumoniae NOT DETECTED NOT DETECTED Final   Proteus species NOT DETECTED NOT DETECTED Final   Salmonella species NOT DETECTED NOT DETECTED Final   Serratia marcescens NOT DETECTED NOT DETECTED Final   Haemophilus influenzae NOT DETECTED NOT DETECTED Final   Neisseria meningitidis NOT DETECTED NOT DETECTED Final   Pseudomonas aeruginosa NOT DETECTED NOT DETECTED Final   Stenotrophomonas maltophilia NOT DETECTED NOT DETECTED Final   Candida albicans NOT DETECTED NOT DETECTED Final   Candida auris NOT DETECTED NOT DETECTED Final   Candida glabrata NOT DETECTED NOT DETECTED Final   Candida krusei NOT DETECTED NOT DETECTED Final   Candida parapsilosis NOT DETECTED NOT DETECTED Final   Candida tropicalis NOT DETECTED NOT DETECTED Final   Cryptococcus neoformans/gattii NOT DETECTED NOT DETECTED Final   CTX-M ESBL NOT DETECTED NOT DETECTED Final   Carbapenem resistance IMP NOT DETECTED NOT DETECTED Final   Carbapenem resistance KPC NOT DETECTED NOT DETECTED Final   Carbapenem resistance NDM NOT DETECTED NOT DETECTED Final   Carbapenem resist OXA 48 LIKE NOT DETECTED NOT DETECTED Final   Carbapenem resistance VIM NOT DETECTED NOT DETECTED Final    Comment: Performed at [facility], [address] [place], [place] [address]  Resp panel by RT-PCR (RSV, Flu [name]&B, Covid) Anterior Nasal Swab     Status: None   Collection Time: 03/09/23  8:56 PM   Specimen: Anterior Nasal Swab  Result Value Ref Range Status   SARS Coronavirus 2 by  RT PCR NEGATIVE NEGATIVE Final    Comment: (NOTE) SARS-CoV-2 target nucleic acids are NOT DETECTED.  The SARS-CoV-2 RNA is generally detectable in upper respiratory specimens during the acute phase of infection. The lowest concentration of SARS-CoV-2 viral copies this assay can detect is 138 copies/mL. [name] negative result does not preclude SARS-Cov-2 infection and should not be used as the sole basis for treatment or other patient management decisions. [name] negative result may occur with  improper specimen collection/handling, submission of specimen other than nasopharyngeal swab, presence of viral mutation(s) within the areas targeted by this assay, and inadequate number of viral copies(<138 copies/mL). [name] negative result must be combined with clinical observations, patient history, and epidemiological information. The expected result is Negative.  Fact Sheet for Patients:  BloggerCourse.com  Fact Sheet for Healthcare Providers:  SeriousBroker.it  This test is no t yet approved or cleared by the [place] FDA and  has been authorized for detection and/or diagnosis of SARS-CoV-2 by FDA under an Emergency Use Authorization (EUA). This EUA will remain  in effect (meaning this test can be used) for the duration of the COVID-19 declaration under Section 564(b)(1) of the Act, 21 U.S.C.section 360bbb-3(b)(1), unless the authorization is terminated  or revoked sooner.       Influenza [name] by PCR NEGATIVE NEGATIVE Final   Influenza B by PCR NEGATIVE NEGATIVE Final    Comment: (NOTE) The Xpert Xpress SARS-CoV-2/FLU/RSV plus assay is intended as an aid in the diagnosis of influenza from Nasopharyngeal swab specimens and should not be used as [name] sole  basis for treatment. Nasal washings and aspirates are unacceptable for Xpert Xpress SARS-CoV-2/FLU/RSV testing.  Fact Sheet for Patients: BloggerCourse.com  Fact Sheet  for Healthcare Providers: SeriousBroker.it  This test is not yet approved or cleared by the [place] FDA and has been authorized for detection and/or diagnosis of SARS-CoV-2 by FDA under an Emergency Use Authorization (EUA). This EUA will remain in effect (meaning this test can be used) for the duration of the COVID-19 declaration under Section 564(b)(1) of the Act, 21 U.S.C. section 360bbb-3(b)(1), unless the authorization is terminated or revoked.     Resp Syncytial Virus by PCR NEGATIVE NEGATIVE Final    Comment: (NOTE) Fact Sheet for Patients: BloggerCourse.com  Fact Sheet for Healthcare Providers: SeriousBroker.it  This test is not yet approved or cleared by the [place] FDA and has been authorized for detection and/or diagnosis of SARS-CoV-2 by FDA under an Emergency Use Authorization (EUA). This EUA will remain in effect (meaning this test can be used) for the duration of the COVID-19 declaration under Section 564(b)(1) of the Act, 21 U.S.C. section 360bbb-3(b)(1), unless the authorization is terminated or revoked.  Performed at [facility], [address], [place], [place] [address]   Urine Culture     Status: Abnormal   Collection Time: 03/09/23  9:05 PM   Specimen: Urine, Random  Result Value Ref Range Status   Specimen Description   Final    URINE, RANDOM Performed at [facility], [address], [place], [place] [address]    Special Requests   Final    NONE Reflexed from [id number] Performed at [facility], [address], [place], [place] [address]    Culture ([name])  Final    >=100,000 COLONIES/mL ESCHERICHIA COLI 50,000 COLONIES/mL ENTEROBACTER ASBURIAE    Report Status 03/13/2023 FINAL  Final   Organism ID, Bacteria ESCHERICHIA COLI ([name])  Final   Organism ID, Bacteria ENTEROBACTER ASBURIAE ([name])  Final       Susceptibility   Enterobacter asburiae - MIC*    CEFEPIME <=0.12 SENSITIVE Sensitive     CIPROFLOXACIN <=0.25 SENSITIVE Sensitive     GENTAMICIN <=1 SENSITIVE Sensitive     IMIPENEM <=0.25 SENSITIVE Sensitive     NITROFURANTOIN 32 SENSITIVE Sensitive     TRIMETH/SULFA <=20 SENSITIVE Sensitive     PIP/TAZO <=4 SENSITIVE Sensitive ug/mL    * 50,000 COLONIES/mL ENTEROBACTER ASBURIAE   Escherichia coli - MIC*    AMPICILLIN <=2 SENSITIVE Sensitive     CEFAZOLIN <=4 SENSITIVE Sensitive     CEFEPIME <=0.12 SENSITIVE Sensitive     CEFTRIAXONE <=0.25 SENSITIVE Sensitive     CIPROFLOXACIN <=0.25 SENSITIVE Sensitive     GENTAMICIN <=1 SENSITIVE Sensitive     IMIPENEM <=0.25 SENSITIVE Sensitive     NITROFURANTOIN <=16 SENSITIVE Sensitive     TRIMETH/SULFA <=20 SENSITIVE Sensitive     AMPICILLIN/SULBACTAM <=2 SENSITIVE Sensitive     PIP/TAZO <=4 SENSITIVE Sensitive ug/mL    * >=100,000 COLONIES/mL ESCHERICHIA COLI         Radiology Studies: DG C-Arm 1-60 Min-No Report Result Date: 03/12/2023 Fluoroscopy was utilized by the requesting physician.  No radiographic interpretation.   DG C-Arm 1-60 Min-No Report Result Date: 03/12/2023 Fluoroscopy was utilized by the requesting physician.  No radiographic interpretation.        Scheduled Meds:  Chlorhexidine Gluconate Cloth  6 each Topical Daily   feeding supplement  237 mL Oral BID BM   finasteride  5 mg Oral  Daily   polyethylene glycol  17 g Oral Daily   rosuvastatin  10 mg Oral Daily   sodium chloride flush  3 mL Intravenous Q12H   tamsulosin  0.4 mg Oral Daily   warfarin  5 mg Oral ONCE-1600   warfarin  5 mg Oral ONCE-1600   Warfarin - Pharmacist Dosing Inpatient   Does not apply q1600   Continuous Infusions:  piperacillin-tazobactam (ZOSYN)  IV 3.375 g (03/13/23 1314)     LOS: 3 days    Time spent: over 30 min    Lacretia Nicks, MD [facility]   To contact the attending provider between 7A-7P or the  covering provider during after hours 7P-7A, please log into the web site [email or web address] and access using universal [place] password for that web site. If you do not have the password, please call the hospital operator.  03/13/2023, 1:29 PM

## 2023-03-13 NOTE — Progress Notes (Signed)
 Physical Therapy Treatment Patient Details Name: [name]. MRN: [id number] DOB: 1935-08-07 [name] Date: 03/13/2023   History of Present Illness 88 yo male admitted with weakness. Dx: UTI, R renal stones surgery 3/4 for kidny stone and stent and Foley removed. Hx of B TKAs, L THA, CVA, gout, peripheral neuropathy, sepsis, Afib, indwelling foley catheter. Recent d/c from SNF.    PT Comments  Pt agreeable to working with PT. Tolerated session well.     If plan is discharge home, recommend the following: A little help with walking and/or transfers;A little help with bathing/dressing/bathroom;Assistance with cooking/housework;Assist for transportation;Help with stairs or ramp for entrance   Can travel by private vehicle        Equipment Recommendations  None recommended by PT    Recommendations for Other Services       Precautions / Restrictions Precautions Precautions: Fall Restrictions Weight Bearing Restrictions Per Provider Order: No     Mobility  Bed Mobility               General bed mobility comments: oob in recliner    Transfers Overall transfer level: Needs assistance Equipment used: Rolling walker (2 wheels) Transfers: Sit to/from Stand Sit to Stand: Supervision                Ambulation/Gait Ambulation/Gait assistance: Supervision Gait Distance (Feet): 300 Feet Assistive device: Rolling walker (2 wheels) Gait Pattern/deviations: Step-through pattern, Decreased stride length       General Gait Details: No overt LOB with RW use. Continues to tolerate ambulation well.   Stairs             Wheelchair Mobility     Tilt Bed    Modified Rankin (Stroke Patients Only)       Balance Overall balance assessment: Needs assistance         Standing balance support: Bilateral upper extremity supported, During functional activity, Reliant on assistive device for balance Standing balance-Leahy Scale: Fair                               Hotel manager: No apparent difficulties Factors Affecting Communication: Hearing impaired  Cognition Arousal: Alert Behavior During Therapy: WFL for tasks assessed/performed   PT - Cognitive impairments: No apparent impairments                         Following commands: Intact      Cueing    Exercises      General Comments        Pertinent Vitals/Pain Pain Assessment Pain Assessment: No/denies pain    Home Living                          Prior Function            PT Goals (current goals can now be found in the care plan section) Progress towards PT goals: Progressing toward goals    Frequency    Min 1X/week      PT Plan      Co-evaluation              AM-PAC PT "6 Clicks" Mobility   Outcome Measure  Help needed turning from your back to your side while in a flat bed without using bedrails?: None Help needed moving from lying on your back to sitting on the side of a  flat bed without using bedrails?: None Help needed moving to and from a bed to a chair (including a wheelchair)?: A Little Help needed standing up from a chair using your arms (e.g., wheelchair or bedside chair)?: A Little Help needed to walk in hospital room?: A Little Help needed climbing 3-5 steps with a railing? : A Little 6 Click Score: 20    End of Session   Activity Tolerance: Patient tolerated treatment well Patient left: in chair;with call bell/phone within reach   PT Visit Diagnosis: Difficulty in walking, not elsewhere classified (R26.2);Muscle weakness (generalized) (M62.81)     Time: 0981-1914 PT Time Calculation (min) (ACUTE ONLY): 12 min  Charges:    $Gait Training: 8-22 mins PT General Charges $$ ACUTE PT VISIT: 1 Visit              Faye Ramsay, PT Acute Rehabilitation  Office: [phone]

## 2023-03-13 NOTE — Progress Notes (Signed)
 PHARMACY - ANTICOAGULATION CONSULT NOTE  Pharmacy Consult for warfarin Indication: valvular Afib  Allergies  Allergen Reactions   Sulfa Antibiotics Other (See Comments)    Granulocytosis   Cefepime Other (See Comments)    encephalopathy   Sulfamethoxazole-Trimethoprim     Listed on MAR   Zestril [Lisinopril] Cough    Patient Measurements: Height: 6' (182.9 cm) Weight: 75 kg (165 lb 5.5 oz) IBW/kg (Calculated) : 77.6  Vital Signs: Temp: 97.8 F (36.6 C) (03/05 0515) Temp Source: Oral (03/05 0515) BP: 147/82 (03/05 0515) Pulse Rate: 71 (03/05 0515)  Labs: Recent Labs    03/12/23 1022 03/13/23 0555 03/13/23 0917  HGB 8.2*  --  8.1*  HCT 27.9*  --  27.9*  PLT 179  --  177  LABPROT  --  22.1*  --   INR  --  1.9*  --   CREATININE 0.95  --  0.73    Estimated Creatinine Clearance: 71.6 mL/min (by C-G formula based on SCr of 0.73 mg/dL).   Medical History: Past Medical History:  Diagnosis Date   Acquired dilation of ascending aorta and aortic root (HCC)    43mm aortic root and 41mm ascending aorta on echo 03/2020   Anemia    Arthritis    Bladder stones    Borderline diabetes    BPH (benign prostatic hyperplasia)    CAD (coronary artery disease) 12/19/2020   Non-obstructive coronary artery disease at cath in 2019   Complication of anesthesia    problem with voiding after anesthesia,   Coronary artery disease    cardiologist-  dr Rockey Situ gerhart NP--- per cath 06-02-2010 non-obstructive cad pLAD 30-40%   CVA (cerebral vascular accident) (HCC) 10/24/2016   Diverticulosis of colon    Dysrhythmia    afib   Gout    Heart murmur    History of adenomatous polyp of colon    2002-- tubular adenoma   History of aortic insufficiency    severe -- s/p  AVR 08-03-2010   History of small bowel obstruction    02/ 2007 mechanical sbo s/p  surgical intervention;  partial sbo 09/ 2011 and 03-20-2011 resolved without surgical intervention   History of urinary retention     HTN (hypertension)    Lower extremity edema    Other secondary pulmonary hypertension (HCC) 03/20/2022   TTE 03/19/22: EF 65-70, no RWMA, moderate LVH, normal RVSF, severe pulmonary hypertension (RVSP 60.4), severe BAE, normal structure and function of mitral valve repair (mean gradient 9), mild TR, trivial AI, normal structure and function of AVR (mean 15.4), moderate PI, aortic root and ascending aorta 40 mm, RAP 15   Peripheral neuropathy    Persistent atrial fibrillation (HCC)    Pre-diabetes    S/P aortic valve replacement with prosthetic valve 08/03/2010   tissue valve   S/P Maze operation for atrial fibrillation 01/30/2018   Complete bilateral atrial lesion set using bipolar radiofrequency and cryothermy with clipping of LA appendage   S/P MVR (mitral valve repair) 01/30/2018   Complex valvuloplasty including artificial Gore-tex neochord placement x4 and Carbo medics Annuloflex ring annuloplasty, size 28   S/P patent foramen ovale closure 08/03/2010   at same time AVR   S/P tricuspid valve repair 01/30/2018   Using an MC3 Annuloplasty ring, size 28   Stroke (HCC)    Thrombocytopenia (HCC)    Tricuspid regurgitation     Medications:  Medications Prior to Admission  Medication Sig Dispense Refill Last Dose/Taking   allopurinol (  ZYLOPRIM) 300 MG tablet Take 150 mg by mouth every morning.   Past Week   amLODipine (NORVASC) 10 MG tablet TAKE 1 TABLET BY MOUTH DAILY 100 tablet 3 Past Week   atenolol (TENORMIN) 25 MG tablet TAKE ONE-HALF TABLET BY MOUTH  DAILY 50 tablet 3 Past Week   cholecalciferol (VITAMIN D3) 25 MCG (1000 UNIT) tablet Take 1,000 Units by mouth every evening.   Past Week   Cinnamon 500 MG capsule Take 500 mg by mouth daily.   Past Week   ferrous sulfate 325 (65 FE) MG EC tablet Take 325 mg by mouth 2 (two) times daily.   Past Week   finasteride (PROSCAR) 5 MG tablet Take 1 tablet (5 mg total) by mouth daily. 30 tablet 0 Past Week   magnesium hydroxide (MILK OF  MAGNESIA) 400 MG/5ML suspension Take 30 mLs by mouth daily as needed for mild constipation.   Taking As Needed   Multiple Vitamins-Minerals (CENTRUM SILVER ADULT 50+) TABS Take 1 tablet by mouth daily.   Past Week   polyethylene glycol (MIRALAX / GLYCOLAX) 17 g packet Take 17 g by mouth daily as needed for moderate constipation.   Taking As Needed   rosuvastatin (CRESTOR) 10 MG tablet TAKE 1 TABLET BY MOUTH DAILY 100 tablet 3 Past Week   senna-docusate (SENOKOT-S) 8.6-50 MG tablet Take 2 tablets by mouth daily.   Past Week   traZODone (DESYREL) 50 MG tablet Take 25 mg by mouth at bedtime as needed for sleep.   Taking As Needed   warfarin (COUMADIN) 5 MG tablet TAKE 1 TO 1 AND 1/2 TABLETS BY  MOUTH DAILY OR AS DIRECTED BY  ANTICOAGULATION CLINIC (Patient taking differently: Take 5-7.5 mg by mouth as directed. Take 1 tablet (5 mg) daily except on Mondays Take 1.5 tablets (7.5 mg)) 100 tablet 4 03/06/2023   Scheduled:   Chlorhexidine Gluconate Cloth  6 each Topical Daily   feeding supplement  237 mL Oral BID BM   finasteride  5 mg Oral Daily   polyethylene glycol  17 g Oral Daily   rosuvastatin  10 mg Oral Daily   sodium chloride flush  3 mL Intravenous Q12H   tamsulosin  0.4 mg Oral Daily   warfarin  5 mg Oral ONCE-1600   PRN: acetaminophen **OR** acetaminophen, ondansetron **OR** ondansetron (ZOFRAN) IV, senna-docusate, traZODone  Assessment: 49 yoM with PMH valvular Afib (bioprosthetic AV/MV; TV repair) on chronic warfarin, R ureteral stone s/p stenting in January, admitted 3/1 for sepsis d/t E coli bacteremia resulting from retained stone. Today, 3/4, pt is s/p stone extraction and stent exchange; Pharmacy consulted to resume warfarin tonight.  Baseline INR 1.7 on 3/2  Prior anticoagulation: warfarin 5 mg PO daily, except 7.5 mg qMon LD 5 mg on 3/2 (after INR resulted that morning)  Significant events:  Today, 03/13/2023: CBC: Hgb low but stable after surgery; Plt stable WNL INR is 1.9   Major drug interactions: none noted No bleeding issues per nursing Meal intake not fully charted  Goal of Therapy: INR 2-3  Plan: Warfarin 5 mg PO tonight at 16:00 Daily INR CBC at least q72 hr while on warfarin Monitor for signs of bleeding or thrombosis   Adalberto Cole, PharmD, BCPS 03/13/2023 11:58 AM

## 2023-03-13 NOTE — Plan of Care (Signed)

## 2023-03-13 NOTE — Progress Notes (Signed)
 Occupational Therapy Treatment Patient Details Name: [name]. MRN: [id number] DOB: 1935-06-16 [name] Date: 03/13/2023   History of present illness 88 yo male admitted with weakness. Dx: UTI, R renal stones surgery 3/4 for kidny stone and stent and Foley removed. Hx of B TKAs, L THA, CVA, gout, peripheral neuropathy, sepsis, Afib, indwelling foley catheter. Recent d/c from SNF.   OT comments  Pt seen for skilled OT session this am with pt open to all therapy presented. Pt now with Foley catheter removed and voiding allowing for increased ease with LB ADL's. OT progressed standing tolerance and balance with reaching excursion challenges with RW support during AM self care retraining. Pt continues to benefit from Acute OT services to progress goals and no changes to discharge recommendations for HHOT follow up with family support upon discharge.       If plan is discharge home, recommend the following:  A little help with walking and/or transfers;A little help with bathing/dressing/bathroom;Assistance with cooking/housework;Direct supervision/assist for medications management;Direct supervision/assist for financial management;Assist for transportation;Help with stairs or ramp for entrance;Supervision due to cognitive status   Equipment Recommendations  Tub/shower bench       Precautions / Restrictions Precautions Precautions: Fall Recall of Precautions/Restrictions: Intact Restrictions Weight Bearing Restrictions Per Provider Order: No       Mobility Bed Mobility Overal bed mobility: Modified Independent Bed Mobility: Supine to Sit, Sit to Supine Rolling: Modified independent (Device/Increase time) Sidelying to sit: Modified independent (Device/Increase time) Supine to sit: Modified independent (Device/Increase time), HOB elevated, Used rails          Transfers Overall transfer level: Needs assistance Equipment used: Rolling walker (2 wheels) Transfers: Sit to/from  Stand, Bed to chair/wheelchair/BSC Sit to Stand: Supervision           General transfer comment: min cues for hand placement     Balance Overall balance assessment: Needs assistance Sitting-balance support: No upper extremity supported, Feet supported Sitting balance-Leahy Scale: Good     Standing balance support: Bilateral upper extremity supported, During functional activity, Reliant on assistive device for balance Standing balance-Leahy Scale: Fair Standing balance comment: completed simple ADL's in standing this session with lateral excurion chalenges unilateral UE's                           ADL either performed or assessed with clinical judgement   ADL Overall ADL's : Needs assistance/impaired     Grooming: Wash/dry hands;Wash/dry face;Applying deodorant;Brushing hair;Supervision/safety;Sitting;Standing Grooming Details (indicate cue type and reason): progressed standing tolerance with light grooming EOB with RW for support and min cues for pacing and ECT Upper Body Bathing: Supervision/ safety;Sitting   Lower Body Bathing: Contact guard assist;Sitting/lateral leans;Sit to/from stand   Upper Body Dressing : Supervision/safety;Sitting   Lower Body Dressing: Contact guard assist;Sitting/lateral leans;Sit to/from stand                 General ADL Comments: Foley nopw out and pt has been using urinal successfully. OT education on ECT and progressing standing tolerance and balance this session with ADL's    Extremity/Trunk Assessment Upper Extremity Assessment Upper Extremity Assessment: Overall WFL for tasks assessed                     Communication Communication Communication: No apparent difficulties Factors Affecting Communication: Hearing impaired   Cognition Arousal: Alert Behavior During Therapy: WFL for tasks assessed/performed Cognition: No apparent impairments  OT - Cognition Comments: pt reports no residual  confusion this am                 Following commands: Intact                      Pertinent Vitals/ Pain       Pain Assessment Pain Assessment: No/denies pain   Frequency  Min 2X/week        Progress Toward Goals  OT Goals(current goals can now be found in the care plan section)  Progress towards OT goals: Progressing toward goals  Acute Rehab OT Goals Patient Stated Goal: to get stronger OT Goal Formulation: With patient Time For Goal Achievement: 03/25/23 Potential to Achieve Goals: Good ADL Goals Pt Will Perform Grooming: with supervision;sitting Pt Will Perform Upper Body Bathing: with supervision;sitting Pt Will Perform Lower Body Bathing: with set-up;sit to/from stand Pt Will Perform Lower Body Dressing: with supervision;sit to/from stand Pt Will Transfer to Toilet: with supervision;ambulating;regular height toilet Pt Will Perform Tub/Shower Transfer: with contact guard assist;tub bench;rolling walker;grab bars   AM-PAC OT "6 Clicks" Daily Activity     Outcome Measure   Help from another person eating meals?: None Help from another person taking care of personal grooming?: A Little Help from another person toileting, which includes using toliet, bedpan, or urinal?: A Little Help from another person bathing (including washing, rinsing, drying)?: A Little Help from another person to put on and taking off regular upper body clothing?: A Little Help from another person to put on and taking off regular lower body clothing?: A Little 6 Click Score: 19    End of Session Equipment Utilized During Treatment: Gait belt;Rolling walker (2 wheels)  OT Visit Diagnosis: Unsteadiness on feet (R26.81);Other abnormalities of gait and mobility (R26.89);Muscle weakness (generalized) (M62.81)   Activity Tolerance Patient tolerated treatment well   Patient Left in chair;with call bell/phone within reach;with chair alarm set   Nurse Communication Mobility status         Time: 8295-6213 OT Time Calculation (min): 29 min  Charges: OT General Charges $OT Visit: 1 Visit OT Treatments $Self Care/Home Management : 23-37 mins  Andreika Vandagriff OT/L Acute Rehabilitation Department  [phone]   03/13/2023, 10:18 AM

## 2023-03-14 ENCOUNTER — Other Ambulatory Visit (HOSPITAL_COMMUNITY): Payer: Self-pay

## 2023-03-14 DIAGNOSIS — A419 Sepsis, unspecified organism: Secondary | ICD-10-CM | POA: Diagnosis not present

## 2023-03-14 LAB — COMPREHENSIVE METABOLIC PANEL
ALT: 27 U/L (ref 0–44)
AST: 46 U/L — ABNORMAL HIGH (ref 15–41)
Albumin: 2.8 g/dL — ABNORMAL LOW (ref 3.5–5.0)
Alkaline Phosphatase: 80 U/L (ref 38–126)
Anion gap: 12 (ref 5–15)
BUN: 21 mg/dL (ref 8–23)
CO2: 22 mmol/L (ref 22–32)
Calcium: 8.9 mg/dL (ref 8.9–10.3)
Chloride: 105 mmol/L (ref 98–111)
Creatinine, Ser: 0.7 mg/dL (ref 0.61–1.24)
GFR, Estimated: >60 mL/min (ref >60)
Glucose, Bld: 91 mg/dL (ref 70–99)
Potassium: 3.9 mmol/L (ref 3.5–5.1)
Sodium: 139 mmol/L (ref 135–145)
Total Bilirubin: 0.7 mg/dL (ref 0.0–1.2)
Total Protein: 7 g/dL (ref 6.5–8.1)

## 2023-03-14 LAB — CBC WITH DIFFERENTIAL/PLATELET
Abs Immature Granulocytes: 0.02 10*3/uL (ref 0.00–0.07)
Basophils Absolute: 0 10*3/uL (ref 0.0–0.1)
Basophils Relative: 0 %
Eosinophils Absolute: 0 10*3/uL (ref 0.0–0.5)
Eosinophils Relative: 0 %
HCT: 27 % — ABNORMAL LOW (ref 39.0–52.0)
Hemoglobin: 8 g/dL — ABNORMAL LOW (ref 13.0–17.0)
Immature Granulocytes: 0 %
Lymphocytes Relative: 25 %
Lymphs Abs: 1.9 10*3/uL (ref 0.7–4.0)
MCH: 27.8 pg (ref 26.0–34.0)
MCHC: 29.6 g/dL — ABNORMAL LOW (ref 30.0–36.0)
MCV: 93.8 fL (ref 80.0–100.0)
Monocytes Absolute: 0.7 10*3/uL (ref 0.1–1.0)
Monocytes Relative: 9 %
Neutro Abs: 5.1 10*3/uL (ref 1.7–7.7)
Neutrophils Relative %: 66 %
Platelets: 175 10*3/uL (ref 150–400)
RBC: 2.88 MIL/uL — ABNORMAL LOW (ref 4.22–5.81)
RDW: 16.1 % — ABNORMAL HIGH (ref 11.5–15.5)
WBC: 7.8 10*3/uL (ref 4.0–10.5)
nRBC: 0 % (ref 0.0–0.2)

## 2023-03-14 LAB — MAGNESIUM: Magnesium: 2 mg/dL (ref 1.7–2.4)

## 2023-03-14 LAB — PROTIME-INR
INR: 1.8 — ABNORMAL HIGH (ref 0.8–1.2)
Prothrombin Time: 21.5 s — ABNORMAL HIGH (ref 11.4–15.2)

## 2023-03-14 LAB — PHOSPHORUS: Phosphorus: 3.3 mg/dL (ref 2.5–4.6)

## 2023-03-14 MED ORDER — WARFARIN SODIUM 5 MG PO TABS: 7.5000 mg | ORAL_TABLET | Freq: Once | ORAL | Status: DC

## 2023-03-14 MED ORDER — WARFARIN SODIUM 2 MG PO TABS
4.0000 mg | ORAL_TABLET | Freq: Every day | ORAL | 0 refills | Status: DC
  Filled 2023-03-14: qty 14, 7d supply, fill #0

## 2023-03-14 MED ORDER — SILODOSIN 8 MG PO CAPS: 8.0000 mg | ORAL_CAPSULE | Freq: Every day | ORAL | Status: DC

## 2023-03-14 MED ORDER — CIPROFLOXACIN HCL 500 MG PO TABS
500.0000 mg | ORAL_TABLET | Freq: Two times a day (BID) | ORAL | 0 refills | Status: AC
  Filled 2023-03-14: qty 14, 7d supply, fill #0

## 2023-03-14 NOTE — Progress Notes (Addendum)
 PHARMACY - ANTICOAGULATION CONSULT NOTE  Pharmacy Consult for warfarin Indication: valvular Afib  Allergies  Allergen Reactions   Sulfa Antibiotics Other (See Comments)    Granulocytosis   Cefepime Other (See Comments)    encephalopathy   Sulfamethoxazole-Trimethoprim     Listed on MAR   Zestril [Lisinopril] Cough    Patient Measurements: Height: 6' (182.9 cm) Weight: 73.1 kg (161 lb 2.5 oz) IBW/kg (Calculated) : 77.6  Vital Signs: Temp: 98 F (36.7 C) (03/06 0509) Temp Source: Oral (03/06 0509) BP: 134/76 (03/06 0509) Pulse Rate: 61 (03/06 0509)  Labs: Recent Labs    03/12/23 1022 03/13/23 0555 03/13/23 0917 03/14/23 0545  HGB 8.2*  --  8.1* 8.0*  HCT 27.9*  --  27.9* 27.0*  PLT 179  --  177 175  LABPROT  --  22.1*  --  21.5*  INR  --  1.9*  --  1.8*  CREATININE 0.95  --  0.73 0.70    Estimated Creatinine Clearance: 69.8 mL/min (by C-G formula based on SCr of 0.7 mg/dL).   Medical History: Past Medical History:  Diagnosis Date   Acquired dilation of ascending aorta and aortic root (HCC)    43mm aortic root and 41mm ascending aorta on echo 03/2020   Anemia    Arthritis    Bladder stones    Borderline diabetes    BPH (benign prostatic hyperplasia)    CAD (coronary artery disease) 12/19/2020   Non-obstructive coronary artery disease at cath in 2019   Complication of anesthesia    problem with voiding after anesthesia,   Coronary artery disease    cardiologist-  dr Rockey Situ gerhart NP--- per cath 06-02-2010 non-obstructive cad pLAD 30-40%   CVA (cerebral vascular accident) (HCC) 10/24/2016   Diverticulosis of colon    Dysrhythmia    afib   Gout    Heart murmur    History of adenomatous polyp of colon    2002-- tubular adenoma   History of aortic insufficiency    severe -- s/p  AVR 08-03-2010   History of small bowel obstruction    02/ 2007 mechanical sbo s/p  surgical intervention;  partial sbo 09/ 2011 and 03-20-2011 resolved without surgical  intervention   History of urinary retention    HTN (hypertension)    Lower extremity edema    Other secondary pulmonary hypertension (HCC) 03/20/2022   TTE 03/19/22: EF 65-70, no RWMA, moderate LVH, normal RVSF, severe pulmonary hypertension (RVSP 60.4), severe BAE, normal structure and function of mitral valve repair (mean gradient 9), mild TR, trivial AI, normal structure and function of AVR (mean 15.4), moderate PI, aortic root and ascending aorta 40 mm, RAP 15   Peripheral neuropathy    Persistent atrial fibrillation (HCC)    Pre-diabetes    S/P aortic valve replacement with prosthetic valve 08/03/2010   tissue valve   S/P Maze operation for atrial fibrillation 01/30/2018   Complete bilateral atrial lesion set using bipolar radiofrequency and cryothermy with clipping of LA appendage   S/P MVR (mitral valve repair) 01/30/2018   Complex valvuloplasty including artificial Gore-tex neochord placement x4 and Carbo medics Annuloflex ring annuloplasty, size 28   S/P patent foramen ovale closure 08/03/2010   at same time AVR   S/P tricuspid valve repair 01/30/2018   Using an MC3 Annuloplasty ring, size 28   Stroke (HCC)    Thrombocytopenia (HCC)    Tricuspid regurgitation     Medications:  Medications Prior to Admission  Medication  Sig Dispense Refill Last Dose/Taking   allopurinol (ZYLOPRIM) 300 MG tablet Take 150 mg by mouth every morning.   Past Week   amLODipine (NORVASC) 10 MG tablet TAKE 1 TABLET BY MOUTH DAILY 100 tablet 3 Past Week   atenolol (TENORMIN) 25 MG tablet TAKE ONE-HALF TABLET BY MOUTH  DAILY 50 tablet 3 Past Week   cholecalciferol (VITAMIN D3) 25 MCG (1000 UNIT) tablet Take 1,000 Units by mouth every evening.   Past Week   Cinnamon 500 MG capsule Take 500 mg by mouth daily.   Past Week   ferrous sulfate 325 (65 FE) MG EC tablet Take 325 mg by mouth 2 (two) times daily.   Past Week   finasteride (PROSCAR) 5 MG tablet Take 1 tablet (5 mg total) by mouth daily. 30 tablet 0  Past Week   magnesium hydroxide (MILK OF MAGNESIA) 400 MG/5ML suspension Take 30 mLs by mouth daily as needed for mild constipation.   Taking As Needed   Multiple Vitamins-Minerals (CENTRUM SILVER ADULT 50+) TABS Take 1 tablet by mouth daily.   Past Week   polyethylene glycol (MIRALAX / GLYCOLAX) 17 g packet Take 17 g by mouth daily as needed for moderate constipation.   Taking As Needed   rosuvastatin (CRESTOR) 10 MG tablet TAKE 1 TABLET BY MOUTH DAILY 100 tablet 3 Past Week   senna-docusate (SENOKOT-S) 8.6-50 MG tablet Take 2 tablets by mouth daily.   Past Week   traZODone (DESYREL) 50 MG tablet Take 25 mg by mouth at bedtime as needed for sleep.   Taking As Needed   warfarin (COUMADIN) 5 MG tablet TAKE 1 TO 1 AND 1/2 TABLETS BY  MOUTH DAILY OR AS DIRECTED BY  ANTICOAGULATION CLINIC (Patient taking differently: Take 5-7.5 mg by mouth as directed. Take 1 tablet (5 mg) daily except on Mondays Take 1.5 tablets (7.5 mg)) 100 tablet 4 03/06/2023   Scheduled:   Chlorhexidine Gluconate Cloth  6 each Topical Daily   feeding supplement  237 mL Oral BID BM   finasteride  5 mg Oral Daily   polyethylene glycol  17 g Oral Daily   rosuvastatin  10 mg Oral Daily   sodium chloride flush  3 mL Intravenous Q12H   tamsulosin  0.4 mg Oral Daily   Warfarin - Pharmacist Dosing Inpatient   Does not apply q1600   PRN: acetaminophen **OR** acetaminophen, ondansetron **OR** ondansetron (ZOFRAN) IV, senna-docusate, traZODone  Assessment: 51 yoM with PMH valvular Afib (bioprosthetic AV/MV; TV repair) on chronic warfarin, R ureteral stone s/p stenting in January, admitted 3/1 for sepsis d/t E coli bacteremia resulting from retained stone. Today, 3/4, pt is s/p stone extraction and stent exchange; Pharmacy consulted to resume warfarin tonight.   Prior anticoagulation: warfarin 5 mg PO daily, except 7.5 mg qMon LD 5 mg on 3/2 (after INR resulted that morning)  Significant events:  Today, 03/14/2023: CBC: Hgb low but  stable after surgery; Plt stable WNL INR is 1.8, subtherapeutic  Major drug interactions: none noted No bleeding issues per nursing Meal intake not fully charted  Goal of Therapy: INR 2-3  Plan: Warfarin 7.5 mg PO tonight at 16:00 Daily INR CBC at least q72 hr while on warfarin If plan remains to discharge patient on ciprofloxacin, consider decreasing warfarin dose while on ciprofloxacin and have close follow up monitoring  Monitor for signs of bleeding or thrombosis   Adalberto Cole, PharmD, BCPS 03/14/2023 7:29 AM

## 2023-03-14 NOTE — TOC Transition Note (Signed)
 Transition of Care [facility]) - Discharge Note   Patient Details  Name: [name]. MRN: [id number] Date of Birth: 07/26/1935  Transition of Care [facility]) CM/SW Contact:  Otelia Santee, LCSW Phone Number: 03/14/2023, 11:16 AM   Clinical Narrative:    Pt to return home with HHPT/OT through [facility]. HH orders in place. No DME needs identified.   Final next level of care: Home w Home Health Services Barriers to Discharge: Continued Medical Work up   Patient Goals and CMS Choice Patient states their goals for this hospitalization and ongoing recovery are:: home CMS Medicare.gov Compare Post Acute Care list provided to:: Patient        Discharge Placement                       Discharge Plan and Services Additional resources added to the After Visit Summary for     Discharge Planning Services: CM Consult                                 Social Drivers of Health (SDOH) Interventions SDOH Screenings   Food Insecurity: No Food Insecurity (03/10/2023)  Housing: Low Risk  (03/10/2023)  Transportation Needs: No Transportation Needs (03/10/2023)  Utilities: Not At Risk (03/10/2023)  Alcohol Screen: Low Risk  (06/02/2021)  Depression (PHQ2-9): Low Risk  (06/16/2021)  Financial Resource Strain: Low Risk  (06/16/2021)  Social Connections: Unknown (03/10/2023)  Stress: No Stress Concern Present (06/16/2021)  Tobacco Use: Low Risk  (03/09/2023)     Readmission Risk Interventions    03/12/2023   11:06 AM 02/11/2023    4:00 PM 11/10/2021    3:54 PM  Readmission Risk Prevention Plan  Transportation Screening Complete Complete Complete  PCP or Specialist Appt within 3-5 Days Complete Not Complete Complete  Not Complete comments  Patient will discharge to SNF.   HRI or Home Care Consult Complete Complete Complete  Social Work Consult for Recovery Care Planning/Counseling Complete Complete Complete  Palliative Care Screening Not Applicable Not Applicable Not Applicable  Medication  Review (RN Care Manager) Complete Complete Complete

## 2023-03-14 NOTE — Discharge Summary (Addendum)
 Physician Discharge Summary  [name]. VWU:[id number] DOB: Aug 14, 1935 DOA: 03/09/2023  PCP: Gerald Soho, PA-C  Admit date: 03/09/2023 Discharge date: 03/14/2023  Time spent: 40 minutes  Recommendations for Outpatient Follow-up:  Follow outpatient CBC/CMP  Needs close follow up with coumadin clinic (appt scheduled for 3:45 on 3/10) - his warfarin dose reduced to 4 mg daily while he's on ciprofloxacin Urology follow up next week for stent removal (they'll call to schedule) Atenolol on hold due to intermittent bradycardia - follow with PCP outpatient Follow hemorrhagic renal cyst/renal lesions outpatient  Discharge Diagnoses:  Principal Problem:   Sepsis (HCC) Active Problems:   Urinary tract infection associated with indwelling urethral catheter (HCC)   Essential hypertension   Permanent atrial fibrillation (HCC)   Benign prostatic hyperplasia with lower urinary tract symptoms   History of ischemic right MCA stroke   Long term current use of anticoagulant   Anemia of chronic disease   Bacteremia   Discharge Condition: stable  Diet recommendation: heart healthy   Filed Weights   03/12/23 0500 03/13/23 0500 03/14/23 0500  Weight: 73.2 kg 75 kg 73.1 kg    History of present illness:   88 year old man PMH including [name]-fib, valvular heart disease, and multiple other medical issues who had [name] recent admission with bacteremia in the setting of an obstructing stone now s/p stent placement in late January.  He presented with nausea, vomiting, and generalized weakness.  Was found to have e. Coli bacteremia.  He's now s/p cystoscopy with holmium laser application, stone extraction, and stent exchange.    Discharged on cipro to cover e. Coli in blood and urine and enterobacter asburiae in urine.  Warfarin dose reduced and close follow up with coumadin clinic scheduled.  See below for additional details.     Hospital Course:  Assessment and Plan:  Sepsis secondary to E.  coli UTI associated with chronic indwelling Foley catheter E. coli bacteremia PMH right ureteral stone s/p stent placement 02/07/2023 BPH CT showed malpositioned Foley catheter, resolution of prior right hydronephrosis s/p ureteral stent placement and migration of 7 mm stone now in the proximal ureter. He's now s/p cystoscopy with R ureteroscopy with holmium laser application, stone extraction and stent exchange - R ureteroscopy with holmium laser application and stone extraction for right renal stone Foley catheter discontinued Continue Proscar and rapaflo. Appreciate urology eval -> needs follow up stent removal with urology (in 1 week at Urology office) Blood cultures with e. Coli Urine culture with e. Coli and enterobacter asburiae  -> will cover both with abx ->repeat EKG with improved Qtc. -> cipro probably the best abx to cover both of these   Permanent atrial fibrillation S/p maze 2020 S/p bioprosthetic AVR, MV and TV repair: Remains in atrial fibrillation.  Intermittent mild bradycardic episodes.  INR is 1.8. Holding atenolol for now given bradycardia Resume warfarin per pharmacy -> reducing warfarin dose to 4 mg daily on discharge as we're starting cipro - he'll need close follow up (appointment scheduled with coumadin clinic for 3/10 at 3:45   Chronic HFpEF with pulmonary HTN: Not on diuretics as an outpatient.  Appears stable.  Holding beta-blocker at discharge, follow outpatient.   Recent CVA Continue rosuvastatin  continue warfarin (reduced dose with initiation of cipro at discharge)   Anemia of chronic disease: Hemoglobin stable    Hypertension Resume amlodipine - atenolol on hold   BPH Finasteride, silodosin Foley has been discontinued   Right Renal Cyst with Hemorrhage Renal Lesions Reeval  with urology outpatient Needs additional imaging outpatient     Procedures:   3/4  Procedure: 1.  Cystoscopy with right ureteroscopy with holmium laser application,  stone extraction and stent exchange for right proximal stone. 2.  Right ureteroscopy with holmium laser application and stone extraction for right renal stone. 3.  Application of fluoroscopy.     Consultations: urology  Discharge Exam: Vitals:   03/13/23 1959 03/14/23 0509  BP: (!) 141/70 134/76  Pulse: 67 61  Resp: 18 16  Temp: 98.2 F (36.8 C) 98 F (36.7 C)  SpO2: 98% 97%   No complaints Called son, no answer (he called back and we discussed discharge plan) Long discussion with sister as well (30 min)  General: No acute distress. Cardiovascular: RRR Lungs: unlabored Abdomen: Soft, nontender, nondistended  Neurological: Alert and oriented 3. Moves all extremities 4 with equal strength. Cranial nerves II through XII grossly intact. Extremities: No clubbing or cyanosis. No edema.  Discharge Instructions   Discharge Instructions     Call MD for:  difficulty breathing, headache or visual disturbances   Complete by: As directed    Call MD for:  extreme fatigue   Complete by: As directed    Call MD for:  hives   Complete by: As directed    Call MD for:  persistant dizziness or light-headedness   Complete by: As directed    Call MD for:  persistant nausea and vomiting   Complete by: As directed    Call MD for:  redness, tenderness, or signs of infection (pain, swelling, redness, odor or green/yellow discharge around incision site)   Complete by: As directed    Call MD for:  severe uncontrolled pain   Complete by: As directed    Call MD for:  temperature >100.4   Complete by: As directed    Diet - low sodium heart healthy   Complete by: As directed    Discharge instructions   Complete by: As directed    You were seen for [name] urinary tract infection and bacteria in your blood.  You had [name] procedure done with urology where they exchanged the stent and extracted the right renal stone.  You'll need to follow up with them next week for stent removal.    We'll send you  on ciprofloxacin to cover your urinary infection and the bacteria in your blood.  This antibiotic covers both of the urinary pathogens in your urine as well as the pathogen seen in your blood.  It can have an affect on your INR level though, so I'd like you to schedule close follow up with the coumadin clinic and reduce your dose until you see them in follow up (to 4 mg daily).  Your atenolol has been held during this admission for slow heart rate.  Continue to hold on discharge.  Follow with your outpatient provider to determine whether this should be resumed.  You had Rosell Khouri right renal cyst with hemorrhage that should be followed with urology outpatient.  You have renal lesions that warrant follow up imaging when able.  Please follow this up with urology outpatient.    Return for new, recurrent, or worsening symptoms.  Please ask your PCP to request records from this hospitalization so they know what was done and what the next steps will be.   Increase activity slowly   Complete by: As directed       Allergies as of 03/14/2023       Reactions  Sulfa Antibiotics Other (See Comments)   Granulocytosis   Cefepime Other (See Comments)   encephalopathy   Sulfamethoxazole-trimethoprim    Listed on MAR   Zestril [lisinopril] Cough        Medication List     PAUSE taking these medications    atenolol 25 MG tablet Wait to take this until your doctor or other care provider tells you to start again. Hold your atenolol until you follow up with your PCP or cardiologist outpatient.  Your heart rate has been on the lower side. Commonly known as: TENORMIN TAKE ONE-HALF TABLET BY MOUTH  DAILY   warfarin 5 MG tablet Wait to take this until your doctor or other care provider tells you to start again. Hold this while you're taking the ciprofloxacin, we'll discharge you on Shontez Sermon reduced dose regimen. Commonly known as: COUMADIN Take as directed. If you are unsure how to take this medication, talk to  your nurse or doctor. Original instructions: TAKE 1 TO 1 AND 1/2 TABLETS BY  MOUTH DAILY OR AS DIRECTED BY  ANTICOAGULATION CLINIC You also have another medication with the same name that you may need to continue taking.       TAKE these medications    allopurinol 300 MG tablet Commonly known as: ZYLOPRIM Take 150 mg by mouth every morning.   amLODipine 10 MG tablet Commonly known as: NORVASC TAKE 1 TABLET BY MOUTH DAILY   Centrum Silver Adult 50+ Tabs Take 1 tablet by mouth daily.   cholecalciferol 25 MCG (1000 UNIT) tablet Commonly known as: VITAMIN D3 Take 1,000 Units by mouth every evening.   Cinnamon 500 MG capsule Take 500 mg by mouth daily.   ciprofloxacin 500 MG tablet Commonly known as: Cipro Take 1 tablet (500 mg total) by mouth 2 (two) times daily for 7 days.   ferrous sulfate 325 (65 FE) MG EC tablet Take 325 mg by mouth 2 (two) times daily.   finasteride 5 MG tablet Commonly known as: PROSCAR Take 1 tablet (5 mg total) by mouth daily.   magnesium hydroxide 400 MG/5ML suspension Commonly known as: MILK OF MAGNESIA Take 30 mLs by mouth daily as needed for mild constipation.   polyethylene glycol 17 g packet Commonly known as: MIRALAX / GLYCOLAX Take 17 g by mouth daily as needed for moderate constipation.   rosuvastatin 10 MG tablet Commonly known as: CRESTOR TAKE 1 TABLET BY MOUTH DAILY   senna-docusate 8.6-50 MG tablet Commonly known as: Senokot-S Take 2 tablets by mouth daily.   silodosin 8 MG Caps capsule Commonly known as: RAPAFLO Take 1 capsule (8 mg total) by mouth daily.   traZODone 50 MG tablet Commonly known as: DESYREL Take 25 mg by mouth at bedtime as needed for sleep.   warfarin 1 MG tablet Commonly known as: COUMADIN Take as directed. If you are unsure how to take this medication, talk to your nurse or doctor. Original instructions: Take 4 tablets (4 mg total) by mouth daily for 7 days. Follow up within the coumadin clinic for  instructions on how to take going forward.  The antibiotic you're on may affect your INR levels, so you'll need frequent monitoring while you're on this. What changed: Another medication with the same name was paused. Ask your nurse or doctor if you should take this medication.       Allergies  Allergen Reactions   Sulfa Antibiotics Other (See Comments)    Granulocytosis   Cefepime Other (See Comments)  encephalopathy   Sulfamethoxazole-Trimethoprim     Listed on MAR   Zestril [Lisinopril] Cough    Follow-up Information     [facility] Follow up.   Why: The office will call regarding follow up to remove the stent. Contact information: [address] [place] [address] [phone]        [facility] Follow up.   Specialty: Home Health Services Why: Frances Furbish will follow up with you to provide home health services Contact information: [address] [place] [address] [phone]         Gerald Soho, PA-C Follow up.   Specialty: Family Medicine Contact information: [address] [place] [address] [phone]         [facility] [place] [facility] Follow up on 03/18/2023.   Specialty: Cardiology Why: you have an appointment at 3:45 PM on Monday 3/10.  Please call if you need to reschedule this. Contact information: [address] [place] [address] [phone]        Bjorn Pippin, MD Follow up.   Specialty: Urology Contact information: [address] [place] [address] [phone]                  The results of significant diagnostics from this hospitalization (including imaging, microbiology, ancillary and laboratory) are listed below for reference.    Significant Diagnostic Studies: DG C-Arm 1-60 Min-No Report Result Date: 03/12/2023 Fluoroscopy was utilized by the requesting physician.  No radiographic interpretation.   DG  C-Arm 1-60 Min-No Report Result Date: 03/12/2023 Fluoroscopy was utilized by the requesting physician.  No radiographic interpretation.   CT CHEST ABDOMEN PELVIS W CONTRAST Result Date: 03/09/2023 CLINICAL DATA:  Sepsis hx of pneumonia, ureteral stone, UTI - dementia - here with sepsis, nausea vomiting - evaluation for PNA vs intraabdominal infection EXAM: CT CHEST, ABDOMEN, AND PELVIS WITH CONTRAST TECHNIQUE: Multidetector CT imaging of the chest, abdomen and pelvis was performed following the standard protocol during bolus administration of intravenous contrast. RADIATION DOSE REDUCTION: This exam was performed according to the departmental dose-optimization program which includes automated exposure control, adjustment of the mA and/or kV according to patient size and/or use of iterative reconstruction technique. CONTRAST:  OMNIPAQUE IOHEXOL 300 MG/ML  SOLN COMPARISON:  CT renal 02/06/2023, CT abdomen pelvis 09/23/2021 FINDINGS: CT CHEST FINDINGS Cardiovascular: Enlarged heart size. No significant pericardial effusion. The thoracic aorta is normal in caliber. No atherosclerotic plaque of the thoracic aorta. The main pulmonary measures at the upper limits are normal. No central or segmental pulmonary embolus. Aortic valve replacement. Bicuspid and tricuspid valve replacement. Mediastinum/Nodes: No enlarged mediastinal, hilar, or axillary lymph nodes. Thyroid gland, trachea, and esophagus demonstrate no significant findings. Lungs/Pleura: No focal consolidation. No pulmonary nodule. No pulmonary mass. Trace right pleural effusion. No pneumothorax. Musculoskeletal: No chest wall abnormality. No suspicious lytic or blastic osseous lesions. No acute displaced fracture. Multilevel degenerative changes of the spine. CT ABDOMEN PELVIS FINDINGS Hepatobiliary: No focal liver abnormality. Status post cholecystectomy. No biliary dilatation. Pancreas: No focal lesion. Normal pancreatic contour. No surrounding  inflammatory changes. No main pancreatic ductal dilatation. Spleen: Normal in size without focal abnormality. Adrenals/Urinary Tract: No adrenal nodule bilaterally. Bilateral kidneys enhance symmetrically. Slightly increased size heterogeneous right 8.5 x 8 cm right renal lesion. Nickey Kloepfer 2.7 cm superior right renal pole lesion with Cleven Jansma density of 41 Hounsfield units. Subcentimeter hyperdense right renal lesion with Kolton Kienle density of 100  Hounsfield units likely hemorrhagic/proteinaceous cyst-no further follow-up indicated. Interval placement of Briley Sulton right ureteral stent with proximal pigtail within Duanna Runk superior renal pole calyx and distal pigtail at the right ureterovesicular junction. Associated interval resolution of right hydronephrosis. Interval trace migration of Briunna Leicht 7 mm right calcified stone now in the proximal ureter just distal to the right ureteropelvic junction. No right hydroureter. Right nephrolithiasis measuring up to 9 mm. No left hydroureteronephrosis.  No left nephroureterolithiasis. The urinary bladder is unremarkable. Foley catheter tip terminating within the urinary bladder lumen and Foley catheter inflated balloon terminating within the prostatic urethra. Stomach/Bowel: Stomach is within normal limits. No evidence of bowel wall thickening or dilatation. Colonic diverticulosis. Appendix appears normal. Vascular/Lymphatic: No abdominal aorta or iliac aneurysm. Severe atherosclerotic plaque of the aorta and its branches. No abdominal, pelvic, or inguinal lymphadenopathy. Reproductive: Prostate enlarged measuring up to 5.5 cm. Other: No intraperitoneal free fluid. No intraperitoneal free gas. No organized fluid collection. Musculoskeletal: No abdominal wall hernia or abnormality. No suspicious lytic or blastic osseous lesions. No acute displaced fracture. Multilevel degenerative changes of the spine. Total left hip arthroplasty partially visualized. IMPRESSION: 1. Trace right pleural effusion. 2. Foley catheter tip  terminating within the urinary bladder lumen and Foley catheter inflated balloon terminating within the prostatic urethra. Recommend replacement. 3. Slightly increased size of an indeterminate heterogeneous right 8.5 x 8 cm renal lesion likely representing Camey Edell hemorrhagic cyst with underlying malignancy not excluded. Indeterminate right superior renal pole lesion measuring 2.7 cm. When the patient is clinically stable and able to follow directions and hold their breath (preferably as an outpatient) further evaluation with dedicated MRI renal protocol should be considered. 4. Interval resolution of right hydronephrosis status post right ureteral stent placement and trace distal migration of Annsley Akkerman 7 mm calcified stone now in the proximal ureter just distal to the right ureteral pelvic junction. 5. Nonobstructive right nephrolithiasis measuring up to 9 mm. 6. Prostatomegaly. 7. Colonic diverticulosis with no acute diverticulitis. 8.  Aortic Atherosclerosis (ICD10-I70.0). Electronically Signed   By: Tish Frederickson M.D.   On: 03/09/2023 23:25   DG Chest Port 1 View Result Date: 03/09/2023 CLINICAL DATA:  Weakness EXAM: PORTABLE CHEST 1 VIEW COMPARISON:  02/10/2023 FINDINGS: Post sternotomy changes, valve prostheses and atrial appendage clip. Cardiomegaly with vascular congestion and mild diffuse interstitial opacity suggestive of mild edema. No pleural effusion or pneumothorax IMPRESSION: Cardiomegaly with vascular congestion and mild diffuse increased interstitial opacity compared to prior suggesting mild edema Electronically Signed   By: Jasmine Pang M.D.   On: 03/09/2023 21:21   CT HEAD WO CONTRAST ( ) Result Date: 02/13/2023 CLINICAL DATA:  Provided history: Stroke, follow-up. EXAM: CT HEAD WITHOUT CONTRAST TECHNIQUE: Contiguous axial images were obtained from the base of the skull through the vertex without intravenous contrast. RADIATION DOSE REDUCTION: This exam was performed according to the departmental  dose-optimization program which includes automated exposure control, adjustment of the mA and/or kV according to patient size and/or use of iterative reconstruction technique. COMPARISON:  Brain MRI 02/09/2023.  Noncontrast head CT 02/09/2023. FINDINGS: Brain: Generalized cerebral atrophy. Known small acute white matter infarct within the posterior left frontal lobe. Su Duma known small acute white matter infarct within the posterior left temporal lobe is occult by CT and was better appreciated on the recent prior brain MRI of 02/09/2023. Chronic right MCA territory and right cerebellar infarcts again noted. Mild patchy and ill-defined hypoattenuation elsewhere within the cerebral white matter, nonspecific but compatible chronic small vessel disease. There is no  acute intracranial hemorrhage. No evidence of an intracranial mass. No midline shift. Vascular: No hyperdense vessel.  Atherosclerotic calcifications. Skull: No calvarial fracture or aggressive osseous lesion. Sinuses/Orbits: No mass or acute finding within the imaged orbits. No significant paranasal sinus disease at the imaged levels. Other: Right mastoid effusion. IMPRESSION: 1. Known small acute white matter infarcts within the left frontal and left temporal lobes, not appreciably changed in extent from the recent prior brain MRI of 02/09/2023. No evidence of hemorrhagic conversion. 2. Parenchymal atrophy, chronic small vessel ischemic disease and chronic infarcts, as described. 3. Right mastoid effusion. Electronically Signed   By: Jackey Loge D.O.   On: 02/13/2023 12:37    Microbiology: Recent Results (from the past 240 hours)  Blood Culture (routine x 2)     Status: Abnormal   Collection Time: 03/09/23  8:40 PM   Specimen: BLOOD LEFT ARM  Result Value Ref Range Status   Specimen Description   Final    BLOOD LEFT ARM Performed at [facility], [address] [place], [place] [address]    Special Requests   Final    BOTTLES DRAWN AEROBIC  AND ANAEROBIC Blood Culture results may not be optimal due to an inadequate volume of blood received in culture bottles Performed at [facility], [address] [place], [place] [address]    Culture  Setup Time   Final    GRAM NEGATIVE RODS IN BOTH AEROBIC AND ANAEROBIC BOTTLES CRITICAL VALUE NOTED.  VALUE IS CONSISTENT WITH PREVIOUSLY REPORTED AND CALLED VALUE.    Culture (Ian Castagna)  Final    ESCHERICHIA COLI SUSCEPTIBILITIES PERFORMED ON PREVIOUS CULTURE WITHIN THE LAST 5 DAYS. Performed at [facility], [address], [place], [place] [address]    Report Status 03/12/2023 FINAL  Final  Blood Culture (routine x 2)     Status: Abnormal (Preliminary result)   Collection Time: 03/09/23  8:45 PM   Specimen: BLOOD  Result Value Ref Range Status   Specimen Description   Final    BLOOD LEFT ANTECUBITAL Performed at [facility], [address], [place], [place] [address]    Special Requests   Final    BOTTLES DRAWN AEROBIC AND ANAEROBIC Blood Culture adequate volume Performed at [facility], [address] [place], [place] [address]    Culture  Setup Time   Final    GRAM NEGATIVE RODS IN BOTH AEROBIC AND ANAEROBIC BOTTLES CRITICAL RESULT CALLED TO, READ BACK BY AND VERIFIED WITH: PHARMD JUSTIN LEGGE [id number] AT 1730 BY EC Performed at [facility], [address] [place], [place] [address]    Culture ESCHERICHIA COLI (Nikoletta Varma)  Final   Report Status PENDING  Incomplete   Organism ID, Bacteria ESCHERICHIA COLI  Final   Organism ID, Bacteria ESCHERICHIA COLI  Final      Susceptibility   Escherichia coli - KIRBY BAUER*    CEFAZOLIN SENSITIVE Sensitive    Escherichia coli - MIC*    AMPICILLIN <=2 SENSITIVE Sensitive     CEFEPIME <=0.12 SENSITIVE Sensitive     CEFTAZIDIME <=1 SENSITIVE Sensitive     CEFTRIAXONE <=0.25 SENSITIVE Sensitive     CIPROFLOXACIN <=0.25 SENSITIVE Sensitive     GENTAMICIN <=1 SENSITIVE Sensitive      IMIPENEM <=0.25 SENSITIVE Sensitive     TRIMETH/SULFA <=20 SENSITIVE Sensitive     AMPICILLIN/SULBACTAM <=2 SENSITIVE Sensitive     PIP/TAZO <=4 SENSITIVE Sensitive ug/mL    *  ESCHERICHIA COLI    ESCHERICHIA COLI  Blood Culture ID Panel (Reflexed)     Status: Abnormal   Collection Time: 03/09/23  8:45 PM  Result Value Ref Range Status   Enterococcus faecalis NOT DETECTED NOT DETECTED Final   Enterococcus Faecium NOT DETECTED NOT DETECTED Final   Listeria monocytogenes NOT DETECTED NOT DETECTED Final   Staphylococcus species NOT DETECTED NOT DETECTED Final   Staphylococcus aureus (BCID) NOT DETECTED NOT DETECTED Final   Staphylococcus epidermidis NOT DETECTED NOT DETECTED Final   Staphylococcus lugdunensis NOT DETECTED NOT DETECTED Final   Streptococcus species NOT DETECTED NOT DETECTED Final   Streptococcus agalactiae NOT DETECTED NOT DETECTED Final   Streptococcus pneumoniae NOT DETECTED NOT DETECTED Final   Streptococcus pyogenes NOT DETECTED NOT DETECTED Final   Olena Willy.calcoaceticus-baumannii NOT DETECTED NOT DETECTED Final   Bacteroides fragilis NOT DETECTED NOT DETECTED Final   Enterobacterales DETECTED (Martavia Tye) NOT DETECTED Final    Comment: Enterobacterales represent Shainna Faux large order of gram negative bacteria, not Loralee Weitzman single organism. CRITICAL RESULT CALLED TO, READ BACK BY AND VERIFIED WITH: PHARMD JUSTIN LEGGE [id number] AT 1730 BY EC    Enterobacter cloacae complex NOT DETECTED NOT DETECTED Final   Escherichia coli DETECTED (Kaylan Friedmann) NOT DETECTED Final    Comment: CRITICAL RESULT CALLED TO, READ BACK BY AND VERIFIED WITH: PHARMD JUSTIN LEGGE [id number] AT 1730 BY EC    Klebsiella aerogenes NOT DETECTED NOT DETECTED Final   Klebsiella oxytoca NOT DETECTED NOT DETECTED Final   Klebsiella pneumoniae NOT DETECTED NOT DETECTED Final   Proteus species NOT DETECTED NOT DETECTED Final   Salmonella species NOT DETECTED NOT DETECTED Final   Serratia marcescens NOT DETECTED NOT DETECTED Final    Haemophilus influenzae NOT DETECTED NOT DETECTED Final   Neisseria meningitidis NOT DETECTED NOT DETECTED Final   Pseudomonas aeruginosa NOT DETECTED NOT DETECTED Final   Stenotrophomonas maltophilia NOT DETECTED NOT DETECTED Final   Candida albicans NOT DETECTED NOT DETECTED Final   Candida auris NOT DETECTED NOT DETECTED Final   Candida glabrata NOT DETECTED NOT DETECTED Final   Candida krusei NOT DETECTED NOT DETECTED Final   Candida parapsilosis NOT DETECTED NOT DETECTED Final   Candida tropicalis NOT DETECTED NOT DETECTED Final   Cryptococcus neoformans/gattii NOT DETECTED NOT DETECTED Final   CTX-M ESBL NOT DETECTED NOT DETECTED Final   Carbapenem resistance IMP NOT DETECTED NOT DETECTED Final   Carbapenem resistance KPC NOT DETECTED NOT DETECTED Final   Carbapenem resistance NDM NOT DETECTED NOT DETECTED Final   Carbapenem resist OXA 48 LIKE NOT DETECTED NOT DETECTED Final   Carbapenem resistance VIM NOT DETECTED NOT DETECTED Final    Comment: Performed at [facility], [address], [place], [place] [address]  Resp panel by RT-PCR (RSV, Flu Lyan Moyano&B, Covid) Anterior Nasal Swab     Status: None   Collection Time: 03/09/23  8:56 PM   Specimen: Anterior Nasal Swab  Result Value Ref Range Status   SARS Coronavirus 2 by RT PCR NEGATIVE NEGATIVE Final    Comment: (NOTE) SARS-CoV-2 target nucleic acids are NOT DETECTED.  The SARS-CoV-2 RNA is generally detectable in upper respiratory specimens during the acute phase of infection. The lowest concentration of SARS-CoV-2 viral copies this assay can detect is 138 copies/mL. Annika Selke negative result does not preclude SARS-Cov-2 infection and should not be used as the sole basis for treatment or other patient management decisions. Huldah Marin negative result may occur with  improper specimen collection/handling, submission of specimen other than nasopharyngeal  swab, presence of viral mutation(s) within the areas targeted by this assay, and inadequate  number of viral copies(<138 copies/mL). Dinia Joynt negative result must be combined with clinical observations, patient history, and epidemiological information. The expected result is Negative.  Fact Sheet for Patients:  BloggerCourse.com  Fact Sheet for Healthcare Providers:  SeriousBroker.it  This test is no t yet approved or cleared by the [place] FDA and  has been authorized for detection and/or diagnosis of SARS-CoV-2 by FDA under an Emergency Use Authorization (EUA). This EUA will remain  in effect (meaning this test can be used) for the duration of the COVID-19 declaration under Section 564(b)(1) of the Act, 21 U.S.C.section 360bbb-3(b)(1), unless the authorization is terminated  or revoked sooner.       Influenza Mckinleigh Schuchart by PCR NEGATIVE NEGATIVE Final   Influenza B by PCR NEGATIVE NEGATIVE Final    Comment: (NOTE) The Xpert Xpress SARS-CoV-2/FLU/RSV plus assay is intended as an aid in the diagnosis of influenza from Nasopharyngeal swab specimens and should not be used as Blayne Garlick sole basis for treatment. Nasal washings and aspirates are unacceptable for Xpert Xpress SARS-CoV-2/FLU/RSV testing.  Fact Sheet for Patients: BloggerCourse.com  Fact Sheet for Healthcare Providers: SeriousBroker.it  This test is not yet approved or cleared by the [place] FDA and has been authorized for detection and/or diagnosis of SARS-CoV-2 by FDA under an Emergency Use Authorization (EUA). This EUA will remain in effect (meaning this test can be used) for the duration of the COVID-19 declaration under Section 564(b)(1) of the Act, 21 U.S.C. section 360bbb-3(b)(1), unless the authorization is terminated or revoked.     Resp Syncytial Virus by PCR NEGATIVE NEGATIVE Final    Comment: (NOTE) Fact Sheet for Patients: BloggerCourse.com  Fact Sheet for Healthcare  Providers: SeriousBroker.it  This test is not yet approved or cleared by the [place] FDA and has been authorized for detection and/or diagnosis of SARS-CoV-2 by FDA under an Emergency Use Authorization (EUA). This EUA will remain in effect (meaning this test can be used) for the duration of the COVID-19 declaration under Section 564(b)(1) of the Act, 21 U.S.C. section 360bbb-3(b)(1), unless the authorization is terminated or revoked.  Performed at [facility], [address], [place], [place] [address]   Urine Culture     Status: Abnormal   Collection Time: 03/09/23  9:05 PM   Specimen: Urine, Random  Result Value Ref Range Status   Specimen Description   Final    URINE, RANDOM Performed at [facility], [address], [place], [place] [address]    Special Requests   Final    NONE Reflexed from [id number] Performed at [facility], [address] [place], [place] [address]    Culture (Christalynn Boise)  Final    >=100,000 COLONIES/mL ESCHERICHIA COLI 50,000 COLONIES/mL ENTEROBACTER ASBURIAE    Report Status 03/13/2023 FINAL  Final   Organism ID, Bacteria ESCHERICHIA COLI (Joani Cosma)  Final   Organism ID, Bacteria ENTEROBACTER ASBURIAE (Doyle Kunath)  Final      Susceptibility   Enterobacter asburiae - MIC*    CEFEPIME <=0.12 SENSITIVE Sensitive     CIPROFLOXACIN <=0.25 SENSITIVE Sensitive     GENTAMICIN <=1 SENSITIVE Sensitive     IMIPENEM <=0.25 SENSITIVE Sensitive     NITROFURANTOIN 32 SENSITIVE Sensitive     TRIMETH/SULFA <=20 SENSITIVE Sensitive     PIP/TAZO <=4 SENSITIVE Sensitive ug/mL    * 50,000 COLONIES/mL ENTEROBACTER ASBURIAE   Escherichia coli - MIC*  AMPICILLIN <=2 SENSITIVE Sensitive     CEFAZOLIN <=4 SENSITIVE Sensitive     CEFEPIME <=0.12 SENSITIVE Sensitive     CEFTRIAXONE <=0.25 SENSITIVE Sensitive     CIPROFLOXACIN <=0.25 SENSITIVE Sensitive     GENTAMICIN <=1 SENSITIVE Sensitive     IMIPENEM  <=0.25 SENSITIVE Sensitive     NITROFURANTOIN <=16 SENSITIVE Sensitive     TRIMETH/SULFA <=20 SENSITIVE Sensitive     AMPICILLIN/SULBACTAM <=2 SENSITIVE Sensitive     PIP/TAZO <=4 SENSITIVE Sensitive ug/mL    * >=100,000 COLONIES/mL ESCHERICHIA COLI     Labs: Basic Metabolic Panel: Recent Labs  Lab 03/09/23 2048 03/10/23 0600 03/12/23 1022 03/13/23 0917 03/14/23 0545  NA 141 143 145 140 139  K 3.7 3.7 3.9 4.0 3.9  CL 108 112* 111 107 105  CO2 22 22 25  18* 22  GLUCOSE 182* 142* 142* 189* 91  BUN 18 19 22 20 21   CREATININE 1.08 1.08 0.95 0.73 0.70  CALCIUM 8.9 8.4* 8.6* 8.6* 8.9  MG  --   --   --  2.2 2.0  PHOS  --   --   --   --  3.3   Liver Function Tests: Recent Labs  Lab 03/09/23 2048 03/14/23 0545  Ayers 26 46*  ALT 19 27  ALKPHOS 78 80  BILITOT 1.4* 0.7  PROT 8.5* 7.0  ALBUMIN 3.2* 2.8*   Recent Labs  Lab 03/09/23 2048  LIPASE 32   No results for input(s): "AMMONIA" in the last 168 hours. CBC: Recent Labs  Lab 03/09/23 2048 03/10/23 0600 03/12/23 1022 03/13/23 0917 03/14/23 0545  WBC 13.7* 12.3* 6.4 9.1 7.8  NEUTROABS 11.8*  --   --   --  5.1  HGB 9.6* 7.9* 8.2* 8.1* 8.0*  HCT 31.5* 26.5* 27.9* 27.9* 27.0*  MCV 91.6 93.0 95.2 96.5 93.8  PLT 175 146* 179 177 175   Cardiac Enzymes: No results for input(s): "CKTOTAL", "CKMB", "CKMBINDEX", "TROPONINI" in the last 168 hours. BNP: BNP (last 3 results) Recent Labs    02/02/23 0806 02/04/23 1629 02/10/23 0453  BNP 432.5* 1,032.1* 349.4*    ProBNP (last 3 results) No results for input(s): "PROBNP" in the last 8760 hours.  CBG: No results for input(s): "GLUCAP" in the last 168 hours.     Signed:  Lacretia Nicks MD.  [facility] 03/14/2023, 11:06 AM

## 2023-03-14 NOTE — Progress Notes (Signed)
 Meds delivered from [facility] outpatient pharmacy by this RN

## 2023-03-15 LAB — CULTURE, BLOOD (ROUTINE X 2): Special Requests: ADEQUATE

## 2023-03-18 ENCOUNTER — Ambulatory Visit: Attending: Cardiology

## 2023-03-18 DIAGNOSIS — I639 Cerebral infarction, unspecified: Secondary | ICD-10-CM | POA: Diagnosis not present

## 2023-03-18 DIAGNOSIS — I4891 Unspecified atrial fibrillation: Secondary | ICD-10-CM

## 2023-03-18 DIAGNOSIS — Z5181 Encounter for therapeutic drug level monitoring: Secondary | ICD-10-CM

## 2023-03-18 DIAGNOSIS — D696 Thrombocytopenia, unspecified: Secondary | ICD-10-CM | POA: Diagnosis not present

## 2023-03-18 DIAGNOSIS — A419 Sepsis, unspecified organism: Secondary | ICD-10-CM | POA: Diagnosis not present

## 2023-03-18 DIAGNOSIS — D649 Anemia, unspecified: Secondary | ICD-10-CM | POA: Diagnosis not present

## 2023-03-18 DIAGNOSIS — N200 Calculus of kidney: Secondary | ICD-10-CM | POA: Diagnosis not present

## 2023-03-18 DIAGNOSIS — Z9889 Other specified postprocedural states: Secondary | ICD-10-CM | POA: Diagnosis not present

## 2023-03-18 DIAGNOSIS — Z7901 Long term (current) use of anticoagulants: Secondary | ICD-10-CM

## 2023-03-18 LAB — POCT INR: INR: 1.8 — AB (ref 2.0–3.0)

## 2023-03-18 NOTE — Patient Instructions (Signed)
 Take 2 tablets Today only then Continue taking warfarin 1 tablet daily except for 1.5 tablets on Mondays.  Cipro 3/6-3/13 Stay consistent with greens each week. Eat Greens Monday, Wednesday and Friday Recheck INR in 1 WEEK Coumadin Clinic [phone]

## 2023-03-20 ENCOUNTER — Other Ambulatory Visit: Payer: Medicare Other

## 2023-03-20 ENCOUNTER — Ambulatory Visit (HOSPITAL_COMMUNITY): Payer: Medicare Other

## 2023-03-20 ENCOUNTER — Inpatient Hospital Stay: Payer: Medicare Other | Attending: Oncology | Admitting: Oncology

## 2023-03-20 VITALS — BP 129/58 | HR 70 | Temp 98.1°F | Resp 18 | Ht 72.0 in | Wt 163.3 lb

## 2023-03-20 DIAGNOSIS — Z8673 Personal history of transient ischemic attack (TIA), and cerebral infarction without residual deficits: Secondary | ICD-10-CM | POA: Diagnosis not present

## 2023-03-20 DIAGNOSIS — Z7901 Long term (current) use of anticoagulants: Secondary | ICD-10-CM | POA: Insufficient documentation

## 2023-03-20 DIAGNOSIS — R7881 Bacteremia: Secondary | ICD-10-CM | POA: Diagnosis not present

## 2023-03-20 DIAGNOSIS — D696 Thrombocytopenia, unspecified: Secondary | ICD-10-CM | POA: Insufficient documentation

## 2023-03-20 DIAGNOSIS — I4891 Unspecified atrial fibrillation: Secondary | ICD-10-CM | POA: Insufficient documentation

## 2023-03-20 DIAGNOSIS — I2510 Atherosclerotic heart disease of native coronary artery without angina pectoris: Secondary | ICD-10-CM | POA: Insufficient documentation

## 2023-03-20 DIAGNOSIS — D649 Anemia, unspecified: Secondary | ICD-10-CM | POA: Diagnosis not present

## 2023-03-20 LAB — CALCULI, WITH PHOTOGRAPH (CLINICAL LAB)
Calcium Oxalate Monohydrate: 100 %
Weight Calculi: 18 mg

## 2023-03-20 NOTE — Progress Notes (Signed)
  [facility] OFFICE PROGRESS NOTE   Diagnosis: Anemia, thrombocytopenia  INTERVAL HISTORY:   [name] was discharged home 03/14/2023 after the most recent hospital admission with E. coli bacteremia.  He underwent removal of right ureter stones while hospitalized.  He had previously been hospitalized in January with urosepsis, respiratory failure, and a CVA.  He underwent multiple transfusions with packed red blood cells and platelets during the January/February hospital admission.  [name] is now at home.  He is ambulatory.  He is here today with his wife.  No bleeding.  Objective:  Vital signs in last 24 hours:  Blood pressure (!) 129/58, pulse 70, temperature 98.1 F (36.7 C), temperature source Temporal, resp. rate 18, height 6' (1.829 m), weight 163 lb 4.8 oz (74.1 kg), SpO2 100%.    Lymphatics: No cervical, supraclavicular, axillary, or inguinal nodes Resp: Lungs clear bilaterally Cardio: Regular rate and rhythm with premature beats GI: No hepatosplenomegaly Vascular: No leg edema   Lab Results:  Lab Results  Component Value Date   WBC 7.8 03/14/2023   HGB 8.0 (L) 03/14/2023   HCT 27.0 (L) 03/14/2023   MCV 93.8 03/14/2023   PLT 175 03/14/2023   NEUTROABS 5.1 03/14/2023   03/18/2023: Hemoglobin 9.5, MCV 88, platelets 290,000, white count 7.6, ANC 5.4, ferritin 555.5, iron 34, TIBC 268, percent saturation 13 CMP  Lab Results  Component Value Date   NA 139 03/14/2023   K 3.9 03/14/2023   CL 105 03/14/2023   CO2 22 03/14/2023   GLUCOSE 91 03/14/2023   BUN 21 03/14/2023   CREATININE 0.70 03/14/2023   CALCIUM 8.9 03/14/2023   PROT 7.0 03/14/2023   ALBUMIN 2.8 (L) 03/14/2023   AST 46 (H) 03/14/2023   ALT 27 03/14/2023   ALKPHOS 80 03/14/2023   BILITOT 0.7 03/14/2023   GFRNONAA >60 03/14/2023   GFRAA 83 11/25/2019     Medications: I have reviewed the patient's current medications.   Assessment/Plan: Anemia Normal ferritin, normal  vitamin B12, and negative stool Hemoccult cards January 2023 Left hip fracture July 2022 status post left hip replacement  Hospitalization August 2020 with symptomatic anemia, heme positive stool, ferritin 19, status post EGD;  colonoscopy 10/21/2018, gastritis with hemorrhage Atrial fibrillation on Coumadin CAD AVR Mitral valve repair Left total knee replacement 09/19/2021 Admission with urinary retention 09/23/2021 Chronic mild thrombocytopenia Admission 02/02/2023 with respiratory failure, urosepsis,-anemia/thrombocytopenia requiring multiple transfusions while hospitalized, status post cystoscopy with right ureteral stent placement Admission 03/09/2023 with E. coli bacteremia, status post cystoscopy with right ureteroscopy and laser application/stone extraction       Disposition: [name] has a complex medical history including chronic anemia/mild thrombocytopenia.  The anemia has progressed following surgical procedures and hospital admissions over the past year.  I suspect the recent drop in the hemoglobin is related to repeat admission with sepsis, phlebotomy, and surgical procedures.  The hemoglobin is higher compared to when he was discharged from the hospital 03/14/2023.  Hopefully the hemoglobin continue to improve over the next few months.  He will return for office visit and CBC in 2 months.  He will call for symptoms of anemia.  Thornton Papas, MD  03/20/2023  11:43 AM

## 2023-03-21 ENCOUNTER — Ambulatory Visit: Payer: Medicare Other

## 2023-03-21 ENCOUNTER — Ambulatory Visit (HOSPITAL_COMMUNITY): Attending: Internal Medicine

## 2023-03-21 DIAGNOSIS — I38 Endocarditis, valve unspecified: Secondary | ICD-10-CM | POA: Diagnosis not present

## 2023-03-21 DIAGNOSIS — N139 Obstructive and reflux uropathy, unspecified: Secondary | ICD-10-CM | POA: Diagnosis not present

## 2023-03-21 LAB — ECHOCARDIOGRAM COMPLETE
AR max vel: 1.74 cm2
AV Area VTI: 1.89 cm2
AV Area mean vel: 1.83 cm2
AV Mean grad: 25.4 mmHg
AV Peak grad: 44.7 mmHg
Ao pk vel: 3.34 m/s
Area-P 1/2: 1.2 cm2
MV VTI: 1.05 cm2
P 1/2 time: 181 ms
S' Lateral: 2.4 cm

## 2023-03-22 DIAGNOSIS — N136 Pyonephrosis: Secondary | ICD-10-CM | POA: Diagnosis not present

## 2023-03-22 DIAGNOSIS — T83511D Infection and inflammatory reaction due to indwelling urethral catheter, subsequent encounter: Secondary | ICD-10-CM | POA: Diagnosis not present

## 2023-03-25 ENCOUNTER — Encounter: Payer: Self-pay | Admitting: Family Medicine

## 2023-03-26 ENCOUNTER — Telehealth: Payer: Self-pay | Admitting: *Deleted

## 2023-03-26 ENCOUNTER — Telehealth: Payer: Self-pay | Admitting: Physician Assistant

## 2023-03-26 ENCOUNTER — Telehealth: Payer: Self-pay | Admitting: Cardiology

## 2023-03-26 DIAGNOSIS — N136 Pyonephrosis: Secondary | ICD-10-CM | POA: Diagnosis not present

## 2023-03-26 DIAGNOSIS — T83511D Infection and inflammatory reaction due to indwelling urethral catheter, subsequent encounter: Secondary | ICD-10-CM | POA: Diagnosis not present

## 2023-03-26 DIAGNOSIS — Z9889 Other specified postprocedural states: Secondary | ICD-10-CM

## 2023-03-26 DIAGNOSIS — I5030 Unspecified diastolic (congestive) heart failure: Secondary | ICD-10-CM | POA: Insufficient documentation

## 2023-03-26 NOTE — Telephone Encounter (Signed)
 [name] PT calling w/ report for [facility] prior to appt 03/27/23: BP today 150/80 sit, 140/80 standing  Reports some dizzy/lightheadedness Its not clear if due to standing up or duration of exertion Swelling left foot and ankle +1 Has compression hose but pt not sure if he sould wear them

## 2023-03-26 NOTE — Telephone Encounter (Signed)
 Returned call to pt sister (ok per DPR),   She states this has been going on for quite sometime. Sister would like [name] to know she is physician and she has been researching the side effects of amlodipine. She believes he has sensory neuropathy for sometime which she states is a rare side effect of amlodipine. He was recently in the hospital for septic shock. He thinks they stopped his atenolol while in the hospital, they aren't sure why. She is concerned about the loss of balance, and dizziness which they believed to being caused by the amlodipine. He has not been checking his blood pressure bc his machine got put away while we was in the hospital.   They are requesting that they be taken off the amlodipine before it is too late to reverse the sensory neuropathy.   Advised we will send information to [name] for review, of note he does have an appointment tomorrow with Lorin Picket at 1120.

## 2023-03-26 NOTE — Telephone Encounter (Signed)
 Spoke with patient, not currently dizzy, instructed to change positions slowly and to continue to use compression socks to help with lower extremity edema. Confirmed appointment tomorrow with [name] at 11:20 am. No needs at this time.

## 2023-03-26 NOTE — Progress Notes (Unsigned)
 Cardiology Office Note:    Date:  03/27/2023  ID:  Gerald Lou., DOB [birth year], MRN [id number] PCP: Jarrett Soho, PA-C  [facility] Providers Cardiologist:  Donato Schultz, MD Cardiology APP:  Beatrice Lecher, PA-C       Patient Profile:      Aortic insufficiency; s/p bioprosthetic AVR and PFO closure in 2012 Severe MR, severe TR >> S/p MV Repair, TV Repair, MAZE, LA clipping in 01/2018 TTE 03/22/21: EF 60-65, RVSP 50.8, Asc aorta (36 mm), Ao root (41 mm)  TTE 03/19/22: EF 65-70, no RWMA, mod LVH, NL RVSF, severely elevated PASP, RVSP 60.4, severe BAE, s/p MV repair w trivial MR, mean 9 (NL structure and fxn); s/p TV repair; s/p AVR w trivial AI, no AS, mean 15.4, mod PI, mild dilation of aortic root (40 mm), RAP 15  TTE 02/03/2023: EF 60-65, s/p MV repair with mean gradient 8, no MR; s/p AVR with mean gradient 14, no AI; s/p TV repair with mild TR TTE 03/21/23: EF 70-75, no RWMA, mod LVH, NL RVSF, mod pulm HTN, RVSP 57.5, severe RAE, s/p MV repair w trivial MR and mild to mod MS (mean 13 mmHg), s/p TV repair, s/p AVR w trivial AI and mean gradient 25.4 mmHg (normal structure and function of AVR), mod PI, aortic root 40 mm, RAP 15  (HFpEF) heart failure with preserved ejection fraction  Admitted 01/2023 - c/b asp pneumonia, sepsis, ureteral stone s/p stent, hemorrhagic renal cyst and L iliopsoas intramuscular bleed, acute CVA  Pulmonary hypertension (RVSP 03/2022: 60.4) ?related to age, COPD-no further w/u felt to be needed Hypertension Atenolol DCd in 3/22 due to ? HR but resumed due to uncontrolled BP Permanent atrial fibrillation/flutter Chronic anticoagulation w Warfarin Monitor 4/22: 151 SV runs; Mobitz 1, PACs 6.4%, rare PVCs, poss AF/Flutter Hx of strokes R MCA CVA tx with cath based Rx Acute/subacute L frontal CVA 05/2021 S/p CVA during admx in 01/2023 (sepsis, acute HF, a/c anemia) Coronary artery disease Non-obstructive by cath in 10/14/17 (pLCx 30, ost LAD 30,  pLAD 40) Carotid artery stenosis  [place] 02/10/2023: Bilateral ICA 1-39 Aortic atherosclerosis  Dilated thoracic aorta Echocardiogram 3/24:  root 40 mm Hx of GI bleed Borderline diabetes mellitus BPH Peripheral neuropathy L hip fracture due to mechanical fall 7/22 >> s/p L THR Low albumin S/p L TKR 09/2021  Anemia   Dr. Truett Perna (heme)           Discussed the use of AI scribe software for clinical note transcription with the patient, who gave verbal consent to proceed.  History of Present Illness [name]. is a 88 y.o. male who returns for follow up of valvular heart disease, CHF, AFib.  He was admitted in 01/2023 with acute CHF.  Echo demonstrated normal EF.  The hospitalization was complicated by aspiration pneumonia and sepsis.  He had worsening renal function with diuresis.  He suffered an acute stroke as well as worsening anemia in the setting of hemorrhagic renal cyst and intramuscular left iliopsoas bleed.  He required transfusion with PRBCs.  Beta-blocker was stopped secondary to bradycardia.  He was also noted to have a ureteral stone which was treated with ureteral stenting.  He was admitted again 3/1-3/6 with E. coli bacteremia (urosepsis).  He underwent cystoscopy, right ureteroscopy with holmium laser application, stone extraction and stent exchange.  Recent echo 03/21/2023 demonstrated normal EF, moderate pulmonary hypertension, MV repair with mild to moderate mitral stenosis and trivial MR, s/p  TV repair, s/p AVR with trivial AI and mean gradient 25.4 (normal structure and function), moderate PI.  Echo was reviewed with Dr. Anne Fu and continued conservative management has been recommended.  He is here alone. Since discharge, he is feeling better. He has been experiencing lightheadedness, particularly when tired or after physical activity, occurring more frequently in the evening after a busy day. No episodes of syncope, but sitting down alleviates the symptom. Occasionally  feels lightheaded when standing up, especially after physical therapy sessions. He has not required diuretics since discharge. He reports a weight loss of approximately 16-17 pounds since his hospitalization, from 180 pounds to 163 pounds, and hopes to regain this weight. His weight has been stable since returning home. He experiences occasional dyspnea, particularly after physical exertion, but can ambulate around his house without difficulty. He does not have stairs in his home and denies any orthopnea.  He has been experiencing numbness in his feet for at least a year, which he attributes to neuropathy. He is not diabetic and has an upcoming appointment with his neurologist.  His sister had reached out to our office and requested that I call her.  I discussed this with the patient today.  He has given me verbal permission to speak to her.  His sister is concerned that amlodipine is the cause of his neuropathy.   ROS-See HPI    Studies Reviewed:        Results Hgb 02/11/23: 5.9 >> 03/14/23: 8  Labs reviewed Per [facility] 02/10/2023: HDL 11, LDL 23 glycerides 93, total cholesterol 53 03/18/2023: Hgb 9.5, K+ 4.7   Risk Assessment/Calculations:    CHA2DS2-VASc Score = 7   This indicates a 11.2% annual risk of stroke. The patient's score is based upon: CHF History: 1 HTN History: 1 Diabetes History: 0 Stroke History: 2 Vascular Disease History: 1 Age Score: 2 Gender Score: 0            Physical Exam:   VS:  BP 139/62   Pulse 80   Ht 6' (1.829 m)   Wt 163 lb 6.4 oz (74.1 kg)   SpO2 98%   BMI 22.16 kg/m    Wt Readings from Last 3 Encounters:  03/27/23 163 lb 6.4 oz (74.1 kg)  03/20/23 163 lb 4.8 oz (74.1 kg)  03/14/23 161 lb 2.5 oz (73.1 kg)    Constitutional:      Appearance: Healthy appearance. Not in distress.  Neck:     Vascular: No JVR. JVD normal.  Pulmonary:     Breath sounds: Normal breath sounds. No wheezing. No rales.  Cardiovascular:     Normal rate. Regular rhythm.      Murmurs: There is a grade 2/6 systolic murmur at the URSB and LLSB.  Edema:    Peripheral edema present.    Ankle: bilateral trace edema of the ankle. Abdominal:     Palpations: Abdomen is soft.  Skin:    General: Skin is warm and dry.        Assessment and Plan:   Assessment & Plan Heart Failure with Preserved Ejection Fraction As noted, he had a recent admission for volume overload.  His volume status is currently stable without diuretics.   - Continue current management without diuretics.  Valvular Heart Disease Status post bioprosthetic aortic valve replacement in 2012, mitral valve repair, and tricuspid valve repair in 2020. Recent echocardiogram shows normally functioning aortic valve prosthesis, stable tricuspid valve repair, and mitral valve repair with trivial mitral regurgitation and  mild to moderate mitral stenosis. Moderate pulmonary hypertension noted. - Continue conservative management and SBE prophylaxis.  Permanent Atrial Fibrillation Heart rate controlled today.  Bradycardia in the hospital led to discontinuation of atenolol. Current management includes anticoagulation with warfarin. - Continue warfarin as directed by Coumadin clinic. - Obtain monitor as noted to rule out significant bradycardia, high-grade heart block.  Dilated aortic root TTE 03/2023 40 mm. Plan repeat TTE in 1 year.   Coronary Artery Disease Nonobstructive CAD by cardiac catheterization in 2019. No current symptoms suggestive of angina. - Continue Crestor 10 mg daily.  Bradycardia His atenolol was discontinued in the hospital secondary to bradycardia.  Heart rate today on exam he is 62.  He has symptoms of intermittent lightheadedness, particularly in the evening after physical activity.  He has not had syncope.  I suspect his lightheadedness is likely multifactorial and related to deconditioning, anemia.  Given his symptoms and recent bradycardia prompting the discontinuation of beta-blocker  therapy, I have recommended proceeding with event monitor to rule out significant pauses or high-grade heart block - Order 14-day Zio XT heart monitor to rule out significant pauses or high-grade heart block.  Hypertension Blood pressure controlled on amlodipine 10 mg daily.  As noted, his sister has contacted [place] with concerns regarding potential neuropathy as a side effect of amlodipine.  This is not a common side effect.  I did review this with the patient today.  Less than 1% of patients on amlodipine develop neuropathy.  There are multiple other potential causes for his neuropathy.  He has seeing neurology soon.  We discussed potential switch to an ARB like losartan or valsartan (he has a history of ACE induced cough).  He prefers to continue current regimen. - Continue amlodipine 10 mg daily. - I have asked him to discuss neuropathy concerns with his neurologist during upcoming appointment.  I will forward my note to Dr. Terrace Arabia. - He gave me permission to call his sister to discuss concerns about amlodipine and potential medication changes.  Anemia His hemoglobin was as low as 5.9 when he was admitted to the hospital.  This has improved to 9.5.   - Continue follow-up with hematology (Dr. Truett Perna) for anemia management.  Peripheral Neuropathy As noted, concern has been raised for amlodipine contributing to neuropathy.  As noted, amlodipine is an unlikely cause for peripheral neuropathy.  Medication changes were discussed with the patient he preferred to remain on amlodipine for now. - He will discuss neuropathy with neurologist during upcoming appointment.        Dispo:  Return in about 6 months (around 09/27/2023) for Routine Follow Up, w/ Dr. Anne Fu, or Tereso Newcomer, PA-C.  Signed, Tereso Newcomer, PA-C

## 2023-03-26 NOTE — Telephone Encounter (Signed)
-----   Message from Tereso Newcomer sent at 03/25/2023  1:50 PM EDT ----- Echocardiogram findings appear stable. Heart function (ejection fraction) is normal. The aortic valve replacement and tricuspid valve repair are functioning normally. The mitral valve repair shows some increased stiffness. The aortic root (start of the large artery from your heart) is mildly dilated.  PLAN:  -Repeat echocardiogram in 1 year (s/p AVR, MV repair, TV repair, dilated aortic root) Tereso Newcomer, PA-C    03/25/2023 1:39 PM

## 2023-03-26 NOTE — Telephone Encounter (Signed)
 Of note sister would also like a call today from [place] himself.

## 2023-03-26 NOTE — Telephone Encounter (Signed)
 Pt c/o medication issue:  1. Name of Medication:   amLODipine (NORVASC) 10 MG tablet   2. How are you currently taking this medication (dosage and times per day)?   As prescribed  3. Are you having a reaction (difficulty breathing--STAT)?   4. What is your medication issue?    Sister Mare Loan) stated she wants a call back from Wende Mott regarding patient's amLODipine (NORVASC) 10 MG tablet medication and the side effects patient is having.  Sister stated patient has been having memory issues. Sister noted she is in [place] and there is a 3 hour difference and would appreciate a call back this afternoon.

## 2023-03-27 ENCOUNTER — Ambulatory Visit (INDEPENDENT_AMBULATORY_CARE_PROVIDER_SITE_OTHER)

## 2023-03-27 ENCOUNTER — Ambulatory Visit: Payer: Medicare Other | Attending: Physician Assistant | Admitting: Physician Assistant

## 2023-03-27 ENCOUNTER — Encounter: Payer: Self-pay | Admitting: Physician Assistant

## 2023-03-27 VITALS — BP 139/62 | HR 80 | Ht 72.0 in | Wt 163.4 lb

## 2023-03-27 DIAGNOSIS — I2510 Atherosclerotic heart disease of native coronary artery without angina pectoris: Secondary | ICD-10-CM

## 2023-03-27 DIAGNOSIS — R001 Bradycardia, unspecified: Secondary | ICD-10-CM

## 2023-03-27 DIAGNOSIS — I77810 Thoracic aortic ectasia: Secondary | ICD-10-CM

## 2023-03-27 DIAGNOSIS — I10 Essential (primary) hypertension: Secondary | ICD-10-CM

## 2023-03-27 DIAGNOSIS — Z8673 Personal history of transient ischemic attack (TIA), and cerebral infarction without residual deficits: Secondary | ICD-10-CM

## 2023-03-27 DIAGNOSIS — R42 Dizziness and giddiness: Secondary | ICD-10-CM | POA: Diagnosis not present

## 2023-03-27 DIAGNOSIS — I38 Endocarditis, valve unspecified: Secondary | ICD-10-CM

## 2023-03-27 DIAGNOSIS — I5032 Chronic diastolic (congestive) heart failure: Secondary | ICD-10-CM | POA: Diagnosis not present

## 2023-03-27 DIAGNOSIS — G629 Polyneuropathy, unspecified: Secondary | ICD-10-CM

## 2023-03-27 DIAGNOSIS — Z862 Personal history of diseases of the blood and blood-forming organs and certain disorders involving the immune mechanism: Secondary | ICD-10-CM

## 2023-03-27 DIAGNOSIS — I4821 Permanent atrial fibrillation: Secondary | ICD-10-CM | POA: Diagnosis not present

## 2023-03-27 NOTE — Telephone Encounter (Addendum)
 See office note from 03/27/2023. His sister is not listed on his DPR. He gave me verbal permission to contact her Mare Loan Beeck) and discuss his medical care, our conversation today regarding his Amlodipine.  I spoke to Ms. Pickert for 32 mins and shared with her the patient's decision to continue on current therapy for now. I tried to answer all of her questions. The patient will be seeing his neurologist soon. If his neuropathy seems to be due to amlodipine, I am happy to change his Amlodipine to an ARB.  Tereso Newcomer, PA-C    03/27/2023 9:05 PM

## 2023-03-27 NOTE — Patient Instructions (Signed)
 Medication Instructions:  Your physician recommends that you continue on your current medications as directed. Please refer to the Current Medication list given to you today.  *If you need a refill on your cardiac medications before your next appointment, please call your pharmacy*   Lab Work: None ordered  If you have labs (blood work) drawn today and your tests are completely normal, you will receive your results only by: MyChart Message (if you have MyChart) OR A paper copy in the mail If you have any lab test that is abnormal or we need to change your treatment, we will call you to review the results.   Testing/Procedures: Christena Deem- Long Term Monitor Instructions   Your physician has requested you wear your ZIO patch monitor_______days.   This is a single patch monitor.  [employer] supplies one patch monitor per enrollment.  Additional stickers are not available.   Please do not apply patch if you will be having a Nuclear Stress Test, Echocardiogram, Cardiac CT, MRI, or Chest Xray during the time frame you would be wearing the monitor. The patch cannot be worn during these tests.  You cannot remove and re-apply the ZIO XT patch monitor.   Your ZIO patch monitor will be sent USPS Priority mail from [facility] directly to your home address. The monitor may also be mailed to a PO BOX if home delivery is not available.   It may take 3-5 days to receive your monitor after you have been enrolled.   Once you have received you monitor, please review enclosed instructions.  Your monitor has already been registered assigning a specific monitor serial # to you.   Applying the monitor   Shave hair from upper left chest.   Hold abrader disc by orange tab.  Rub abrader in 40 strokes over left upper chest as indicated in your monitor instructions.   Clean area with 4 enclosed alcohol pads .  Use all pads to assure are is cleaned thoroughly.  Let dry.   Apply patch as indicated in  monitor instructions.  Patch will be place under collarbone on left side of chest with arrow pointing upward.   Rub patch adhesive wings for 2 minutes.Remove white label marked "1".  Remove white label marked "2".  Rub patch adhesive wings for 2 additional minutes.   While looking in a mirror, press and release button in center of patch.  A small green light will flash 3-4 times .  This will be your only indicator the monitor has been turned on.     Do not shower for the first 24 hours.  You may shower after the first 24 hours.   Press button if you feel a symptom. You will hear a small click.  Record Date, Time and Symptom in the Patient Log Book.   When you are ready to remove patch, follow instructions on last 2 pages of Patient Log Book.  Stick patch monitor onto last page of Patient Log Book.   Place Patient Log Book in [place] box.  Use locking tab on box and tape box closed securely.  The [employer] and [employer] has [employer] on it.  Please place in mailbox as soon as possible.  Your physician should have your test results approximately 7 days after the monitor has been mailed back to [facility].   Call [facility] Customer Care at [phone] if you have questions regarding your ZIO XT patch monitor.  Call them immediately if you see an orange  light blinking on your monitor.   If your monitor falls off in less than 4 days contact our Monitor department at [phone].  If your monitor becomes loose or falls off after 4 days call [employer] at [phone] for suggestions on securing your monitor.     Follow-Up: At [facility], you and your health needs are our priority.  As part of our continuing mission to provide you with exceptional heart care, we have created designated Provider Care Teams.  These Care Teams include your primary Cardiologist (physician) and Advanced Practice Providers (APPs -  Physician Assistants and Nurse Practitioners) who all work together to  provide you with the care you need, when you need it.  We recommend signing up for the patient portal called "MyChart".  Sign up information is provided on this After Visit Summary.  MyChart is used to connect with patients for Virtual Visits (Telemedicine).  Patients are able to view lab/test results, encounter notes, upcoming appointments, etc.  Non-urgent messages can be sent to your provider as well.   To learn more about what you can do with MyChart, go to [email or web address].    Your next appointment:   6 month(s)  Provider:   Donato Schultz, MD  or Tereso Newcomer, PA-C         Other Instructions     1st Floor: - Lobby - Registration  - Pharmacy  - Lab - Cafe  2nd Floor: - PV Lab - Diagnostic Testing (echo, CT, nuclear med)  3rd Floor: - Vacant  4th Floor: - TCTS (cardiothoracic surgery) - AFib Clinic - Structural Heart Clinic - Vascular Surgery  - Vascular Ultrasound  5th Floor: - HeartCare Cardiology (general and EP) - Clinical Pharmacy for coumadin, hypertension, lipid, weight-loss medications, and med management appointments    Valet parking services will be available as well.

## 2023-03-27 NOTE — Progress Notes (Unsigned)
 Enrolled patient for a 14 day Zio XT monitor to be mailed to patients home  [place] to read

## 2023-03-28 ENCOUNTER — Telehealth: Payer: Self-pay | Admitting: Physician Assistant

## 2023-03-28 ENCOUNTER — Ambulatory Visit: Attending: Cardiology

## 2023-03-28 DIAGNOSIS — N136 Pyonephrosis: Secondary | ICD-10-CM | POA: Diagnosis not present

## 2023-03-28 DIAGNOSIS — T83511D Infection and inflammatory reaction due to indwelling urethral catheter, subsequent encounter: Secondary | ICD-10-CM | POA: Diagnosis not present

## 2023-03-28 DIAGNOSIS — I4891 Unspecified atrial fibrillation: Secondary | ICD-10-CM

## 2023-03-28 DIAGNOSIS — I639 Cerebral infarction, unspecified: Secondary | ICD-10-CM | POA: Diagnosis not present

## 2023-03-28 DIAGNOSIS — Z5181 Encounter for therapeutic drug level monitoring: Secondary | ICD-10-CM

## 2023-03-28 DIAGNOSIS — Z79899 Other long term (current) drug therapy: Secondary | ICD-10-CM

## 2023-03-28 DIAGNOSIS — I10 Essential (primary) hypertension: Secondary | ICD-10-CM

## 2023-03-28 LAB — POCT INR: INR: 1.2 — AB (ref 2.0–3.0)

## 2023-03-28 NOTE — Telephone Encounter (Signed)
 Left voicemail for Rosanne Ashing to return call to office Left voicemail for patient to return call to office

## 2023-03-28 NOTE — Telephone Encounter (Signed)
 Pt c/o BP issue: STAT if pt c/o blurred vision, one-sided weakness or slurred speech.  STAT if BP is GREATER than 180/120 TODAY.  STAT if BP is LESS than 90/60 and SYMPTOMATIC TODAY  1. What is your BP concern? Low BP  2. Have you taken any BP medication today?yes  3. What are your last 5 BP readings?140/80 sitting , 100/60 standing, With compression stockings 110/60 standing, 90/50 sitting  4. Are you having any other symptoms (ex. Dizziness, headache, blurred vision, passed out)? Dizziness  Pt thinks the last couple of day he has missed taking his Warfarin. But home health has got him back on it  Wants verbal home health orders to address this issues.   Pt states he saw S Weaver yesterday and they discuss alternative BP medication. Pt said he would like to go a head and try that medication and have it sent to pharmacy.

## 2023-03-28 NOTE — Patient Instructions (Signed)
 Description   Take 2 tablets today and 2 tablets tomorrow and then resume taking warfarin 1 tablet daily except for 1.5 tablets on Mondays.  Stay consistent with greens each week. Eat Greens Monday, Wednesday and Friday Recheck INR in 1 week.  Coumadin Clinic [phone]

## 2023-04-01 DIAGNOSIS — T83511D Infection and inflammatory reaction due to indwelling urethral catheter, subsequent encounter: Secondary | ICD-10-CM | POA: Diagnosis not present

## 2023-04-01 DIAGNOSIS — N136 Pyonephrosis: Secondary | ICD-10-CM | POA: Diagnosis not present

## 2023-04-01 MED ORDER — LOSARTAN POTASSIUM 50 MG PO TABS: 50.0000 mg | ORAL_TABLET | Freq: Every day | ORAL | 2 refills | Status: DC

## 2023-04-01 MED ORDER — AMLODIPINE BESYLATE 10 MG PO TABS: 5.0000 mg | ORAL_TABLET | Freq: Every day | ORAL | Status: DC

## 2023-04-01 MED ORDER — LOSARTAN POTASSIUM 50 MG PO TABS: 50.0000 mg | ORAL_TABLET | Freq: Every day | ORAL | 0 refills | Status: DC

## 2023-04-01 NOTE — Telephone Encounter (Signed)
 If he would like to try switching, we can do that. Amlodipine can cause more issues with low BP in elderly patients. So, I think it's a good idea to try.  PLAN:  -DC Amlodipine -Start Losartan 50 mg once daily -BMET 2 weeks -Send BPs in 2 weeks.  Tereso Newcomer, PA-C    04/01/2023 5:03 PM

## 2023-04-01 NOTE — Telephone Encounter (Signed)
 Silodosin (Rapaflo) can cause orthostatic hypotension.  If his anemia is getting worse, that can also contribute to orthostatic hypotension. PLAN: -Decrease Amlodipine to 5 mg once daily  -Pt should call Urologist with symptoms and see if Silodosin can be adjusted. -Pt should also call his hematologist to report symptoms. He may need a repeat CBC. -Continue with compression hose. -Drink plenty of water. Tereso Newcomer, PA-C    04/01/2023 4:27 PM

## 2023-04-01 NOTE — Telephone Encounter (Signed)
 Caller Rosanne Ashing) called to report: BP    130/60 seated    120/50 (standing, with some lightheadedness     100/48 (patient walked hallway and back) HR 48-60 and slightly irregular Caller noted patient is using his compression hose. Caller stated patient received the Zio monitor and will have it placed tomorrow (3/25). Caller is also following up on order for a home health nurse and wants a call back to discuss verbal orders.  Caller stated can follow-up directly with patient regarding BP/HR issue.

## 2023-04-01 NOTE — Telephone Encounter (Signed)
 Spoke with patient and shared Scott's response:  If he would like to try switching, we can do that. Amlodipine can cause more issues with low BP in elderly patients. So, I think it's a good idea to try.  PLAN:  -DC Amlodipine -Start Losartan 50 mg once daily -BMET 2 weeks -Send BPs in 2 weeks.  Tereso Newcomer, PA-C    04/01/2023 5:03 PM      Patient requests 30 day supply of Losartan to be sent to [facility], then 90 day to [facility]. BMET ordered and released to [facility]. Patient verbalized understanding of the above.

## 2023-04-01 NOTE — Telephone Encounter (Signed)
 Spoke with patient and discussed recommendations from Scott:  Silodosin (Rapaflo) can cause orthostatic hypotension.  If his anemia is getting worse, that can also contribute to orthostatic hypotension. PLAN: -Decrease Amlodipine to 5 mg once daily  -Pt should call Urologist with symptoms and see if Silodosin can be adjusted. -Pt should also call his hematologist to report symptoms. He may need a repeat CBC. -Continue with compression hose. -Drink plenty of water. Tereso Newcomer, PA-C    04/01/2023 4:27 PM      Patient verbalized understanding. Medication list updated.  Patient states when he last saw Scott in office they discussed discontinuing amlodipine and starting another medication.  From 3/19 OV note: Blood pressure controlled on amlodipine 10 mg daily.  As noted, his sister has contacted [place] with concerns regarding potential neuropathy as a side effect of amlodipine.  This is not a common side effect.  I did review this with the patient today.  Less than 1% of patients on amlodipine develop neuropathy.  There are multiple other potential causes for his neuropathy.  He has seeing neurology soon.  We discussed potential switch to an ARB like losartan or valsartan (he has a history of ACE induced cough).   Patient asked if Lorin Picket would still recommend switching to ARB, if so he is willing to try this.   Will send note back to [facility] to see if this is still advised given orthostatic hypotension.

## 2023-04-02 ENCOUNTER — Other Ambulatory Visit: Payer: Self-pay | Admitting: *Deleted

## 2023-04-02 ENCOUNTER — Ambulatory Visit: Attending: Cardiology | Admitting: *Deleted

## 2023-04-02 DIAGNOSIS — Z9889 Other specified postprocedural states: Secondary | ICD-10-CM

## 2023-04-02 DIAGNOSIS — I4891 Unspecified atrial fibrillation: Secondary | ICD-10-CM

## 2023-04-02 DIAGNOSIS — I639 Cerebral infarction, unspecified: Secondary | ICD-10-CM | POA: Diagnosis not present

## 2023-04-02 DIAGNOSIS — Z5181 Encounter for therapeutic drug level monitoring: Secondary | ICD-10-CM

## 2023-04-02 DIAGNOSIS — Z7901 Long term (current) use of anticoagulants: Secondary | ICD-10-CM

## 2023-04-02 LAB — POCT INR: INR: 2.7 (ref 2.0–3.0)

## 2023-04-02 NOTE — Patient Instructions (Signed)
 Description   Continue taking warfarin 1 tablet daily except for 1.5 tablets on Mondays.  Stay consistent with greens each week. Eat Greens Monday, Wednesday and Friday Recheck INR in 4 weeks per request.  Coumadin Clinic [phone]

## 2023-04-03 DIAGNOSIS — T83511D Infection and inflammatory reaction due to indwelling urethral catheter, subsequent encounter: Secondary | ICD-10-CM | POA: Diagnosis not present

## 2023-04-03 DIAGNOSIS — N136 Pyonephrosis: Secondary | ICD-10-CM | POA: Diagnosis not present

## 2023-04-04 DIAGNOSIS — R310 Gross hematuria: Secondary | ICD-10-CM | POA: Diagnosis not present

## 2023-04-04 DIAGNOSIS — N200 Calculus of kidney: Secondary | ICD-10-CM | POA: Diagnosis not present

## 2023-04-04 DIAGNOSIS — D4101 Neoplasm of uncertain behavior of right kidney: Secondary | ICD-10-CM | POA: Diagnosis not present

## 2023-04-08 ENCOUNTER — Telehealth: Payer: Self-pay | Admitting: Cardiology

## 2023-04-08 DIAGNOSIS — T83511D Infection and inflammatory reaction due to indwelling urethral catheter, subsequent encounter: Secondary | ICD-10-CM | POA: Diagnosis not present

## 2023-04-08 DIAGNOSIS — N136 Pyonephrosis: Secondary | ICD-10-CM | POA: Diagnosis not present

## 2023-04-08 NOTE — Telephone Encounter (Signed)
 Caller [facility]) called to report BP readings:  Sitting 158/70  HR 72 Standing after 1 minute 130/76  HR in the low 70's Standing after 3 min 120/74  HR in the low 70's Standing after 4 minute 128/72  HR regular but having pulses  Caller wants to know which new medication should be patient be taking.

## 2023-04-08 NOTE — Telephone Encounter (Signed)
 We stopped his Amlodipine last week and started Losartan 50 mg once daily. BP drops with standing but standing BP is not too low. Please check BP (sitting) once a day for 2 weeks and send readings after 2 weeks. Tereso Newcomer, PA-C    04/08/2023 11:24 AM

## 2023-04-08 NOTE — Telephone Encounter (Signed)
 Spoke with pt over the phone and explained the plan Gerald Newcomer, PA-C had discussed in his message. Pt verbalized understanding of plan and had no further questions at this time.

## 2023-04-10 ENCOUNTER — Telehealth: Payer: Self-pay | Admitting: Neurology

## 2023-04-10 NOTE — Telephone Encounter (Signed)
 Patient [name] at 9:16 am regarding if need to keep 06/13/23 appt since have a hospital follow up 04/23/23. Called patient back, advised to discuss with Dr. Terrace Arabia if need to keep 06/13/23 appt.

## 2023-04-11 ENCOUNTER — Ambulatory Visit: Payer: Medicare Other | Admitting: Oncology

## 2023-04-11 ENCOUNTER — Other Ambulatory Visit: Payer: Medicare Other

## 2023-04-12 DIAGNOSIS — T83511D Infection and inflammatory reaction due to indwelling urethral catheter, subsequent encounter: Secondary | ICD-10-CM | POA: Diagnosis not present

## 2023-04-12 DIAGNOSIS — N136 Pyonephrosis: Secondary | ICD-10-CM | POA: Diagnosis not present

## 2023-04-15 DIAGNOSIS — T83511D Infection and inflammatory reaction due to indwelling urethral catheter, subsequent encounter: Secondary | ICD-10-CM | POA: Diagnosis not present

## 2023-04-15 DIAGNOSIS — N136 Pyonephrosis: Secondary | ICD-10-CM | POA: Diagnosis not present

## 2023-04-16 DIAGNOSIS — D4101 Neoplasm of uncertain behavior of right kidney: Secondary | ICD-10-CM | POA: Diagnosis not present

## 2023-04-16 DIAGNOSIS — R3914 Feeling of incomplete bladder emptying: Secondary | ICD-10-CM | POA: Diagnosis not present

## 2023-04-17 DIAGNOSIS — N136 Pyonephrosis: Secondary | ICD-10-CM | POA: Diagnosis not present

## 2023-04-17 DIAGNOSIS — T83511D Infection and inflammatory reaction due to indwelling urethral catheter, subsequent encounter: Secondary | ICD-10-CM | POA: Diagnosis not present

## 2023-04-23 ENCOUNTER — Encounter: Payer: Self-pay | Admitting: Neurology

## 2023-04-23 ENCOUNTER — Ambulatory Visit: Admitting: Neurology

## 2023-04-23 ENCOUNTER — Telehealth: Payer: Self-pay | Admitting: Neurology

## 2023-04-23 ENCOUNTER — Inpatient Hospital Stay: Admitting: Neurology

## 2023-04-23 VITALS — BP 140/78 | HR 82 | Ht 70.0 in | Wt 167.0 lb

## 2023-04-23 DIAGNOSIS — I639 Cerebral infarction, unspecified: Secondary | ICD-10-CM

## 2023-04-23 DIAGNOSIS — R269 Unspecified abnormalities of gait and mobility: Secondary | ICD-10-CM | POA: Diagnosis not present

## 2023-04-23 NOTE — Progress Notes (Signed)
 ASSESSMENT AND PLAN  [name]. is a 88 y.o. male   History of stroke,  Recurrent stroke while off Coumadin, now back to home dose most recent INR 2.7  He was on Coumadin due to history of aortic valve replacement and mitral valve, tricuspid valve repair   Gradual worsening bilateral lower extremity paresthesia, gait abnormality,  Examination showed mild bilateral ankle dorsiflexion weakness, length-dependent sensory changes, decreased reflexes,  Differentiation diagnosis including lumbar radiculopathy, peripheral neuropathy,  He would like to proceed with MRI lumbar   DIAGNOSTIC DATA (LABS, IMAGING, TESTING) - I reviewed patient records, labs, notes, testing and imaging myself where available.   MEDICAL HISTORY:  Keatyn Jawad. was seen by Shanda Bumps in the past for stroke, most recent visit in November 2023, accompanying his wife at today's visit, who is seen by our clinic for dementia, he has a lot of question about his gait abnormality, desires to be evaluated today, his primary care physician is Denton Ar, [place], [place]    I reviewed and summarized the referring note. PMHX CAD Aortic valve replacement  in 2012 Mitral valve, tricuspid valve repair in 2020 History of small bowel obstruction s/p surgical intervention in 2011 HTN Stroke,  Long term coumadin use HLD Prostate Hypertrophy Gout Atrial fibrillation, status post maze,  He had a history of stroke presenting with slurred speech, left facial droop,  On May 21, 2021 he presented with generalized weakness, MRI of the brain showed small left frontal positive DWI lesion, CT angiogram of head and neck was negative, evidence of intracranial atherosclerotic disease  Echocardiogram ejection fraction 60 to 65%, severely dilated left atrium, left ventricular hypertrophy,  This happened in the setting of tooth infection, sepsis, he has been treated with Coumadin due to his cardiac condition, atrial  fibrillation,  He and his wife moved to [facility] since the end of 2023, over the past couple years, he noticed increased gait abnormality, ascending bilateral lower extremity paresthesia, initially starting at the bottom of his feet, now to ankle level, balance issues, fairly symmetric  He denies significant neck pain, low back pain, denies bowel and bladder incontinence, fell sometimes,  Personally reviewed MRI of brain in Nov 2023,  1. No acute finding. 2. Chronic ischemia including remote right MCA branch infarct.  Also CT head and neck following his weakness and fall in February 2024, CT head showed atrophy stable right MCA old stroke CT cervical spine showed no acute abnormality multiple degenerative changes, severe osteoporosis of left C4-5, right C5-6, with evidence of foraminal narrowing,  UPDATE April 23 2023: Hospital follow-up he went to emergency room on February 09, 2023 with septic shock, found to have Enterobacter bacteremia secondary to right ureteral stone, status post cystoscope, with right ureteral stent placement on February 07, 2023, he had multiple bleeding complications including a left iliopsoas intramuscular bleed, right renal cyst hemorrhage, and left upper extremity hematoma, also developed ileus,  He had atrial fibrillation but has been off home warfarin due to platelet and bleeding issues, which required platelet and blood transfusion, frozen plasma,  MRI of the brain on February 09, 2023 showed acute left frontal temporal lobe white matter infarction, chronic ischemia involving right MCA right cerebellum  He is now back on Coumadin most recent INR was 2.7, continue to have gait abnormality but denies significant worsening, intermittent low back pain, bilateral lower extremity paresthesia  Echocardiogram March 2025, normal ejection fraction, moderate concentric left ventricular hypertrophy,  Ultrasound of carotid  artery less than 39% stenosis  bilaterally  PHYSICAL EXAM      04/23/2023    1:03 PM 03/27/2023   11:44 AM 03/20/2023   10:58 AM  Vitals with BMI  Height 5\' 10"  6\' 0"  6\' 0"   Weight 167 lbs 163 lbs 6 oz 163 lbs 5 oz  BMI 23.96 22.16 22.14  Systolic 140 139 161  Diastolic 78 62 58  Pulse 82 80 70     PHYSICAL EXAMNIATION:  Gen: NAD, conversant, well nourised, well groomed                     Cardiovascular: Regular rate rhythm, no peripheral edema, warm, nontender. Eyes: Conjunctivae clear without exudates or hemorrhage Neck: Supple, no carotid bruits. Pulmonary: Clear to auscultation bilaterally   NEUROLOGICAL EXAM:  MENTAL STATUS: Speech/cognition: Awake, alert, oriented to history taking and casual conversation CRANIAL NERVES: CN II: Visual fields are full to confrontation. Pupils are round equal and briskly reactive to light. CN III, IV, VI: extraocular movement are normal. No ptosis. CN V: Facial sensation is intact to light touch CN VII: Face is symmetric with normal eye closure  CN VIII: Hearing is normal to causal conversation. CN IX, X: Phonation is normal. CN XI: Head turning and shoulder shrug are intact  MOTOR: Mild bilateral ankle dorsiflexion weakness  REFLEXES: Hypoactive and symmetric, status post bilateral knee replacement  SENSORY: Length-dependent decreased light touch, pinprick to knee level, decreased vibratory sensation to ankle level, absent bilateral toe proprioception,  COORDINATION: There is no trunk or limb dysmetria noted.  GAIT/STANCE: Need push-up to get up from seated position, wide-based, unsteady bilateral foot drop, could not stand up on heels,  REVIEW OF SYSTEMS:  Full 14 system review of systems performed and notable only for as above All other review of systems were negative.   ALLERGIES: Allergies  Allergen Reactions   Sulfa Antibiotics Other (See Comments)    Granulocytosis   Cefepime Other (See Comments)    encephalopathy    Sulfamethoxazole-Trimethoprim     Listed on MAR   Zestril [Lisinopril] Cough    HOME MEDICATIONS: Current Outpatient Medications  Medication Sig Dispense Refill   cholecalciferol (VITAMIN D3) 25 MCG (1000 UNIT) tablet Take 1,000 Units by mouth every evening.     Cinnamon 500 MG capsule Take 500 mg by mouth daily.     finasteride (PROSCAR) 5 MG tablet Take 1 tablet (5 mg total) by mouth daily. 30 tablet 0   losartan (COZAAR) 50 MG tablet Take 1 tablet (50 mg total) by mouth daily. 90 tablet 2   Magnesium Hydroxide (MILK OF MAGNESIA PO) Take by mouth. Take 1 teaspoon by mouth once weekly     magnesium hydroxide (MILK OF MAGNESIA) 400 MG/5ML suspension Take 30 mLs by mouth daily as needed for mild constipation.     Multiple Vitamins-Minerals (CENTRUM SILVER ADULT 50+) TABS Take 1 tablet by mouth daily.     rosuvastatin (CRESTOR) 10 MG tablet TAKE 1 TABLET BY MOUTH DAILY 100 tablet 3   silodosin (RAPAFLO) 4 MG CAPS capsule Take 4 mg by mouth daily with breakfast.     traZODone (DESYREL) 50 MG tablet Take 25 mg by mouth at bedtime as needed for sleep.     warfarin (COUMADIN) 5 MG tablet Take 1 Tablet by mouth daily, except Mondays take 1.5 tablet     allopurinol (ZYLOPRIM) 300 MG tablet Take 150 mg by mouth every morning. (Patient not taking: Reported on 04/23/2023)  No current facility-administered medications for this visit.    PAST MEDICAL HISTORY: Past Medical History:  Diagnosis Date   Acquired dilation of ascending aorta and aortic root (HCC)    43mm aortic root and 41mm ascending aorta on echo 03/2020   Anemia    Arthritis    Bladder stones    Borderline diabetes    BPH (benign prostatic hyperplasia)    CAD (coronary artery disease) 12/19/2020   Non-obstructive coronary artery disease at cath in 2019   Complication of anesthesia    problem with voiding after anesthesia,   Coronary artery disease    cardiologist-  dr Ascencion Lava gerhart NP--- per cath 06-02-2010  non-obstructive cad pLAD 30-40%   CVA (cerebral vascular accident) (HCC) 10/24/2016   Diverticulosis of colon    Dysrhythmia    afib   Gout    Heart murmur    History of adenomatous polyp of colon    2002-- tubular adenoma   History of aortic insufficiency    severe -- s/p  AVR 08-03-2010   History of small bowel obstruction    02/ 2007 mechanical sbo s/p  surgical intervention;  partial sbo 09/ 2011 and 03-20-2011 resolved without surgical intervention   History of urinary retention    HTN (hypertension)    Lower extremity edema    Other secondary pulmonary hypertension (HCC) 03/20/2022   TTE 03/19/22: EF 65-70, no RWMA, moderate LVH, normal RVSF, severe pulmonary hypertension (RVSP 60.4), severe BAE, normal structure and function of mitral valve repair (mean gradient 9), mild TR, trivial AI, normal structure and function of AVR (mean 15.4), moderate PI, aortic root and ascending aorta 40 mm, RAP 15   Peripheral neuropathy    Persistent atrial fibrillation (HCC)    Pre-diabetes    S/P aortic valve replacement with prosthetic valve 08/03/2010   tissue valve   S/P Maze operation for atrial fibrillation 01/30/2018   Complete bilateral atrial lesion set using bipolar radiofrequency and cryothermy with clipping of LA appendage   S/P MVR (mitral valve repair) 01/30/2018   Complex valvuloplasty including artificial Gore-tex neochord placement x4 and Carbo medics Annuloflex ring annuloplasty, size 28   S/P patent foramen ovale closure 08/03/2010   at same time AVR   S/P tricuspid valve repair 01/30/2018   Using an MC3 Annuloplasty ring, size 28   Stroke (HCC)    Thrombocytopenia (HCC)    Tricuspid regurgitation     PAST SURGICAL HISTORY: Past Surgical History:  Procedure Laterality Date   BIOPSY  09/07/2018   Procedure: BIOPSY;  Surgeon: Felecia Hopper, MD;  Location: [facility];  Service: Gastroenterology;;   CARDIAC CATHETERIZATION  06-02-2010  dr Renna Cary   non-obstructive cad-  pLAD 30-40%/  normal LVSF/  severe AI   CARDIOVASCULAR STRESS TEST  04/12/2016   Low risk nuclear perfusion study w/ no significant reversible ischemia/  normal LV function and wall motion ,  stress ef 60%/  2mm inferior and lateral scooped ST-segment depression w/ exercise (may be repolarization abnormality), exercise capacity was moderately reduced   CATARACT EXTRACTION W/ INTRAOCULAR LENS  IMPLANT, BILATERAL  02/2010   CLIPPING OF ATRIAL APPENDAGE N/A 01/30/2018   Procedure: CLIPPING OF LEFT ATRIAL APPENDAGE USING ATRICLIP PRO2 ;  Surgeon: Gardenia Jump, MD;  Location: [facility] OR;  Service: Open Heart Surgery;  Laterality: N/A;   COLONOSCOPY WITH PROPOFOL N/A 10/21/2018   Procedure: COLONOSCOPY WITH PROPOFOL;  Surgeon: Felecia Hopper, MD;  Location: [facility];  Service: Gastroenterology;  Laterality: N/A;  CYSTOSCOPY W/ URETERAL STENT PLACEMENT Right 02/07/2023   Procedure: CYSTOSCOPY WITH RETROGRADE PYELOGRAM/URETERAL STENT PLACEMENT;  Surgeon: Andrez Banker, MD;  Location: [facility];  Service: Urology;  Laterality: Right;   CYSTOSCOPY WITH LITHOLAPAXY N/A 06/05/2016   Procedure: CYSTOSCOPY WITH LITHOLAPAXY and fulgarization of bladder neck;  Surgeon: Homero Luster, MD;  Location: [facility];  Service: Urology;  Laterality: N/A;   CYSTOSCOPY/URETEROSCOPY/HOLMIUM LASER/STENT PLACEMENT Right 03/12/2023   Procedure: CYSTOSCOPY/RIGHT URETEROSCOPY/HOLMIUM LASER/STENT PLACEMENT AND RETROGRADE PYELOGRAM;  Surgeon: Homero Luster, MD;  Location: [facility];  Service: Urology;  Laterality: Right;  60  MINUTE CASE   ESOPHAGOGASTRODUODENOSCOPY (EGD) WITH PROPOFOL N/A 09/07/2018   Procedure: ESOPHAGOGASTRODUODENOSCOPY (EGD) WITH PROPOFOL;  Surgeon: Felecia Hopper, MD;  Location: [facility];  Service: Gastroenterology;  Laterality: N/A;   EXPLORATORY LAPARTOMY /  CHOLECYSTECTOMY  02/28/2005   for Small  bowel obstruction (mechnical)   IR ANGIO EXTRACRAN SEL COM CAROTID INNOMINATE UNI L MOD  SED  10/24/2016   IR ANGIO VERTEBRAL SEL SUBCLAVIAN INNOMINATE BILAT MOD SED  10/24/2016   IR PERCUTANEOUS ART THROMBECTOMY/INFUSION INTRACRANIAL INC DIAG ANGIO  10/24/2016   IR RADIOLOGIST EVAL & MGMT  12/05/2016   LEFT KNEE ARTHROSCOPY  2006   MAZE N/A 01/30/2018   Procedure: MAZE;  Surgeon: Gardenia Jump, MD;  Location[facility];  Service: Open Heart Surgery;  Laterality: N/A;   MITRAL VALVE REPAIR N/A 01/30/2018   Procedure: MITRAL VALVE REPAIR (MVR) USING CARBOMEDICS ANNULOFLEX SIZE 28;  Surgeon: Gardenia Jump, MD;  Location: [facility] OR;  Service: Open Heart Surgery;  Laterality: N/A;   POLYPECTOMY  10/21/2018   Procedure: POLYPECTOMY;  Surgeon: Felecia Hopper, MD;  Location: [facility];  Service: Gastroenterology;;   RADIOLOGY WITH ANESTHESIA N/A 10/24/2016   Procedure: RADIOLOGY WITH ANESTHESIA;  Surgeon: Luellen Sages, MD;  Location: [facility] OR;  Service: Radiology;  Laterality: N/A;   RIGHT FOOT SURGERY     RIGHT MINIATURE ANTERIOR THORACOTOMY FOR AORTIC VALVE REPLACEMENT AND CLOSURE PATENT FORAMEN OVALE  08-03-2010  DR Eleanore Grey Magna-ease pericardial tissue valve (25mm)   RIGHT/LEFT HEART CATH AND CORONARY ANGIOGRAPHY N/A 10/14/2017   Procedure: RIGHT/LEFT HEART CATH AND CORONARY ANGIOGRAPHY;  Surgeon: Arnoldo Lapping, MD;  Location: [facility] INVASIVE CV LAB;  Service: Cardiovascular;  Laterality: N/A;   TEE WITHOUT CARDIOVERSION N/A 10/14/2017   Procedure: TRANSESOPHAGEAL ECHOCARDIOGRAM (TEE);  Surgeon: Loyde Rule, MD;  Location: [facility] ENDOSCOPY;  Service: Cardiovascular;  Laterality: N/A;   TOTAL HIP ARTHROPLASTY Left 08/02/2020   Procedure: TOTAL HIP ARTHROPLASTY ANTERIOR APPROACH;  Surgeon: Claiborne Crew, MD;  Location: [facility];  Service: Orthopedics;  Laterality: Left;   TOTAL KNEE ARTHROPLASTY Left 09/19/2021   Procedure: TOTAL KNEE ARTHROPLASTY;  Surgeon: Claiborne Crew, MD;  Location: [facility];  Service: Orthopedics;  Laterality: Left;   TOTAL KNEE ARTHROPLASTY Right 08/09/2022    Procedure: TOTAL KNEE ARTHROPLASTY;  Surgeon: Claiborne Crew, MD;  Location: [facility];  Service: Orthopedics;  Laterality: Right;  90   TRANSTHORACIC ECHOCARDIOGRAM  05/30/2016  dr skains   moderate  LVH ef 60-65%/  bioprothesis aortic valve present ,normal grandient and no AI /  mild MV calcification , moderate MR /  mild PR/ moderate TR/  PASP 36mmHg/ (RA denisty was identified 04-27-2016 echo) and is seen again today, this is likely a promient eustacian ridge, atrium is normal size   TRICUSPID VALVE REPLACEMENT N/A 01/30/2018   Procedure: TRICUSPID VALVE REPAIR USING MC3 SIZE 28;  Surgeon: Gardenia Jump, MD;  Location:  [facility] OR;  Service: Open Heart Surgery;  Laterality: N/A;    FAMILY HISTORY: Family History  Problem Relation Age of Onset   Heart disease Mother    Brain cancer Father     SOCIAL HISTORY: Social History   Socioeconomic History   Marital status: Married    Spouse name: Marzella Solan   Number of children: 2   Years of education: Not on file   Highest education level: Not on file  Occupational History   Occupation: RETIRED  Tobacco Use   Smoking status: Never   Smokeless tobacco: Never  Vaping Use   Vaping status: Never Used  Substance and Sexual Activity   Alcohol use: Yes    Comment: ONE OR TWO PER MONTH   Drug use: No   Sexual activity: Not Currently  Other Topics Concern   Not on file  Social History Narrative   Not on file   Social Drivers of Health   Financial Resource Strain: Low Risk  (06/16/2021)   Overall Financial Resource Strain (CARDIA)    Difficulty of Paying Living Expenses: Not hard at all  Food Insecurity: No Food Insecurity (03/10/2023)   Hunger Vital Sign    Worried About Running Out of Food in the Last Year: Never true    Ran Out of Food in the Last Year: Never true  Transportation Needs: No Transportation Needs (03/10/2023)   PRAPARE - Administrator, Civil Service (Medical): No    Lack of Transportation (Non-Medical): No  Physical  Activity: Not on file  Stress: No Stress Concern Present (06/16/2021)   [employer] of Occupational Health - Occupational Stress Questionnaire    Feeling of Stress : Only a little  Social Connections: Unknown (03/10/2023)   Social Connection and Isolation Panel [NHANES]    Frequency of Communication with Friends and Family: Twice a week    Frequency of Social Gatherings with Friends and Family: Twice a week    Attends Religious Services: Patient declined    Database administrator or Organizations: Patient declined    Attends Banker Meetings: Patient declined    Marital Status: Married  Catering manager Violence: Not At Risk (03/10/2023)   Humiliation, Afraid, Rape, and Kick questionnaire    Fear of Current or Ex-Partner: No    Emotionally Abused: No    Physically Abused: No    Sexually Abused: No      Phebe Brasil, M.D. Ph.D.  [facility] [address] [place], [place] [address] Ph: [phone] Fax: [phone]  CC:  Etter Hermann., MD [address] [place],  [place] [address]  Darnelle Elders, PA-C

## 2023-04-23 NOTE — Telephone Encounter (Signed)
 [employer] medicare NPR sent to GI [phone]

## 2023-04-25 DIAGNOSIS — N136 Pyonephrosis: Secondary | ICD-10-CM | POA: Diagnosis not present

## 2023-04-25 DIAGNOSIS — T83511D Infection and inflammatory reaction due to indwelling urethral catheter, subsequent encounter: Secondary | ICD-10-CM | POA: Diagnosis not present

## 2023-04-26 ENCOUNTER — Telehealth: Payer: Self-pay | Admitting: Cardiology

## 2023-04-26 NOTE — Telephone Encounter (Signed)
 Reviewed chart and Amlodipine  was stopped and Losartan  was started [facility] aware ./cy

## 2023-04-26 NOTE — Telephone Encounter (Signed)
 Pt c/o medication issue:  1. Name of Medication:   losartan  (COZAAR ) 50 MG tablet   2. How are you currently taking this medication (dosage and times per day)?     3. Are you having a reaction (difficulty breathing--STAT)?   4. What is your medication issue?   Caller [employer]) wants to confirm if patient should stop taking Amlodipine  and should be taking this medication as patient's BP was high at yesterday's reading - 168/82 sitting and 140/80 standing.

## 2023-04-30 ENCOUNTER — Ambulatory Visit: Attending: Cardiology | Admitting: *Deleted

## 2023-04-30 DIAGNOSIS — Z7901 Long term (current) use of anticoagulants: Secondary | ICD-10-CM

## 2023-04-30 DIAGNOSIS — I4821 Permanent atrial fibrillation: Secondary | ICD-10-CM | POA: Diagnosis not present

## 2023-04-30 DIAGNOSIS — I639 Cerebral infarction, unspecified: Secondary | ICD-10-CM | POA: Diagnosis not present

## 2023-04-30 DIAGNOSIS — Z5181 Encounter for therapeutic drug level monitoring: Secondary | ICD-10-CM | POA: Diagnosis not present

## 2023-04-30 DIAGNOSIS — Z9889 Other specified postprocedural states: Secondary | ICD-10-CM

## 2023-04-30 DIAGNOSIS — R001 Bradycardia, unspecified: Secondary | ICD-10-CM | POA: Diagnosis not present

## 2023-04-30 DIAGNOSIS — I4891 Unspecified atrial fibrillation: Secondary | ICD-10-CM

## 2023-04-30 LAB — POCT INR: INR: 2.1 (ref 2.0–3.0)

## 2023-04-30 NOTE — Patient Instructions (Signed)
 Description   Continue taking warfarin 1 tablet daily except for 1.5 tablets on Mondays.  Stay consistent with greens each week. Eat Greens Monday, Wednesday and Friday Recheck INR in 4 weeks per request.  Coumadin Clinic [phone]

## 2023-05-01 ENCOUNTER — Encounter: Payer: Self-pay | Admitting: Physician Assistant

## 2023-05-01 ENCOUNTER — Telehealth: Payer: Self-pay | Admitting: Cardiology

## 2023-05-01 DIAGNOSIS — I4821 Permanent atrial fibrillation: Secondary | ICD-10-CM

## 2023-05-01 DIAGNOSIS — R001 Bradycardia, unspecified: Secondary | ICD-10-CM

## 2023-05-01 DIAGNOSIS — N136 Pyonephrosis: Secondary | ICD-10-CM | POA: Diagnosis not present

## 2023-05-01 DIAGNOSIS — T83511D Infection and inflammatory reaction due to indwelling urethral catheter, subsequent encounter: Secondary | ICD-10-CM | POA: Diagnosis not present

## 2023-05-01 DIAGNOSIS — R42 Dizziness and giddiness: Secondary | ICD-10-CM

## 2023-05-01 HISTORY — DX: Bradycardia, unspecified: R00.1

## 2023-05-01 NOTE — Telephone Encounter (Signed)
 Spoke with patient and he states the nurse from [place] called to give his BP readings because they have been elevated . She will come back next week to check his BP again. He would like for you to review his readings.

## 2023-05-01 NOTE — Telephone Encounter (Signed)
 Pt was supposed to get BMET 2 weeks after starting on Losartan . I do not see one in his chart since it was started. He previously was on amlodipine  10 mg. His sister (she is a MD) had concerns about Amlodipine  causing neuropathy. If renal function ok on Losartan , I can increase the dose. But it would be safer to add back Amlodipine  for now.  PLAN:  -Take Amlodipine  5 mg once daily (he can break 10 mg in 1/2) -Try taking Losartan  in AM and Amlodipine  in PM. -Get BMET done this week  Marlyse Single, PA-C    05/01/2023 5:34 PM

## 2023-05-01 NOTE — Telephone Encounter (Addendum)
 Olivia from [facility] reported end of summary report. Patient wore monitor from 3/26-4/07. He had  complete heart block 30 bpm lasting 13.6 secs on 3/31 page 14 strip 7  Please review results

## 2023-05-01 NOTE — Telephone Encounter (Signed)
 Calling with his bp   Sunday 4/20:  Morning: Ssitting 184/85 Standing 150/68 Evening: Sitting 194/84 Standing 192/70  Monday 4/21: Standing 179/68  Tuesday 4/22: Standing 183/71  Wednesday 4/23: Morning!  Sitting 173/83 Standing 148/68  Afternoon: Sitting 180/90 Standing 166/50  Also patient wants to know if he can take. [place]

## 2023-05-01 NOTE — Telephone Encounter (Signed)
 [facility] calling to report results. Please advise

## 2023-05-02 ENCOUNTER — Other Ambulatory Visit: Payer: Self-pay | Admitting: *Deleted

## 2023-05-02 DIAGNOSIS — R001 Bradycardia, unspecified: Secondary | ICD-10-CM

## 2023-05-02 MED ORDER — AMLODIPINE BESYLATE 10 MG PO TABS: 5.0000 mg | ORAL_TABLET | Freq: Every evening | ORAL | Status: DC

## 2023-05-02 NOTE — Telephone Encounter (Signed)
 Spoke with patient and discussed Marlyse Single, PA-C's recommendations:  PLAN:  -Take Amlodipine  5 mg once daily (he can break 10 mg in 1/2) -Try taking Losartan  in AM and Amlodipine  in PM. -Get BMET done this week   Patient verbalized understanding of the above recommendations. He states he will come in to have labs drawn this week, either today or tomorrow. Patient already has amlodipine  10 mg at home and will cut tablet in half. Med list updated.

## 2023-05-02 NOTE — Telephone Encounter (Signed)
  Luwana Salvo, RN 05/02/2023  8:30 AM EDT     The patient has been notified of the result and verbalized understanding.  All questions (if any) were answered. Luwana Salvo, RN 05/02/2023 8:30 AM   Maeola Schmidt, RMA 05/02/2023  6:43 AM EDT     Referral placed for EP   Sherwood Donath 05/01/2023  6:10 PM EDT     Result note sent to Obed Bellows. via MyChart. See comments below. PLAN: -Refer to EP for bradycardia   Mr. Ihnen Your monitor shows episodes of very slow heart rates. There were also some brief episodes of fast heart rates. I would like to refer you to Electrophysiology (EP) for further evaluation. They are the heart rhythm doctors. I cannot say for sure that they will recommend a pacemaker. The slow heart rates mostly seem to occur when sleeping. This is often not felt to warrant a pacemaker. But with your history and how slow your rate was at times, I think it would be a good idea to have them see you. The office will contact you. Marlyse Single, PA-C   05/01/2023 6:04 PM

## 2023-05-03 DIAGNOSIS — Z79899 Other long term (current) drug therapy: Secondary | ICD-10-CM | POA: Diagnosis not present

## 2023-05-03 DIAGNOSIS — I10 Essential (primary) hypertension: Secondary | ICD-10-CM | POA: Diagnosis not present

## 2023-05-03 LAB — BASIC METABOLIC PANEL WITH GFR
BUN/Creatinine Ratio: 16 (ref 10–24)
BUN: 15 mg/dL (ref 8–27)
CO2: 21 mmol/L (ref 20–29)
Calcium: 9.6 mg/dL (ref 8.6–10.2)
Chloride: 107 mmol/L — ABNORMAL HIGH (ref 96–106)
Creatinine, Ser: 0.91 mg/dL (ref 0.76–1.27)
Glucose: 124 mg/dL — ABNORMAL HIGH (ref 70–99)
Potassium: 4.4 mmol/L (ref 3.5–5.2)
Sodium: 144 mmol/L (ref 134–144)
eGFR: 83 mL/min/{1.73_m2} (ref >59)

## 2023-05-05 ENCOUNTER — Ambulatory Visit
Admission: RE | Admit: 2023-05-05 | Discharge: 2023-05-05 | Disposition: A | Discharge status: Home / Self Care | Source: Ambulatory Visit | Attending: Neurology | Admitting: Neurology

## 2023-05-05 DIAGNOSIS — I639 Cerebral infarction, unspecified: Secondary | ICD-10-CM

## 2023-05-05 DIAGNOSIS — M48061 Spinal stenosis, lumbar region without neurogenic claudication: Secondary | ICD-10-CM | POA: Diagnosis not present

## 2023-05-05 DIAGNOSIS — R269 Unspecified abnormalities of gait and mobility: Secondary | ICD-10-CM | POA: Diagnosis not present

## 2023-05-06 DIAGNOSIS — T83511D Infection and inflammatory reaction due to indwelling urethral catheter, subsequent encounter: Secondary | ICD-10-CM | POA: Diagnosis not present

## 2023-05-06 DIAGNOSIS — N136 Pyonephrosis: Secondary | ICD-10-CM | POA: Diagnosis not present

## 2023-05-07 ENCOUNTER — Encounter: Payer: Self-pay | Admitting: Neurology

## 2023-05-07 DIAGNOSIS — D044 Carcinoma in situ of skin of scalp and neck: Secondary | ICD-10-CM | POA: Diagnosis not present

## 2023-05-07 DIAGNOSIS — L309 Dermatitis, unspecified: Secondary | ICD-10-CM | POA: Diagnosis not present

## 2023-05-07 DIAGNOSIS — D485 Neoplasm of uncertain behavior of skin: Secondary | ICD-10-CM | POA: Diagnosis not present

## 2023-05-09 NOTE — Progress Notes (Signed)
 Pt has been made aware of normal result and verbalized understanding.  jw

## 2023-05-13 ENCOUNTER — Telehealth: Payer: Self-pay | Admitting: Cardiology

## 2023-05-13 DIAGNOSIS — N136 Pyonephrosis: Secondary | ICD-10-CM | POA: Diagnosis not present

## 2023-05-13 DIAGNOSIS — Z79899 Other long term (current) drug therapy: Secondary | ICD-10-CM

## 2023-05-13 DIAGNOSIS — T83511D Infection and inflammatory reaction due to indwelling urethral catheter, subsequent encounter: Secondary | ICD-10-CM | POA: Diagnosis not present

## 2023-05-13 NOTE — Telephone Encounter (Signed)
 Pt c/o BP issue: STAT if pt c/o blurred vision, one-sided weakness or slurred speech.   1. What is your BP concern?  Beth with [facility] is calling to report BP readings after being on Amlodipine  for about 1 week.  2. Have you taken any BP medication today? Yes   3. What are your last 5 BP readings?  Readings are taken about 1.5 hours after medication. Sitting then standing.  4/29: 176/79 135/63  4/30: 183/96 170/74  5/01: 164/90 164/86  5/02: 154/71 143/73  5/03: 155/74 147/72  5/04: 163/74 149/68   5/05: 162/80 142/80  4. Are you having any other symptoms (ex. Dizziness, headache, blurred vision, passed out)?  No lightheadedness, no additional symptoms

## 2023-05-13 NOTE — Telephone Encounter (Signed)
 Make sure pt is limiting salt. Increase Losartan  to 100 mg once daily Wear compression socks if he can to limit BP drops with standing. Marlyse Single, PA-C    05/13/2023 4:25 PM

## 2023-05-13 NOTE — Telephone Encounter (Signed)
 Returned call to pt.  Left a message that I am getting ready to leave the office for the day and I would call back in the a.m.

## 2023-05-13 NOTE — Telephone Encounter (Signed)
 Will forward BP readings to [place] for his review.   Recent changes: 4/2/425 Spoke with patient and discussed Marlyse Single, PA-C's recommendations:   PLAN:  -Take Amlodipine  5 mg once daily (he can break 10 mg in 1/2) -Try taking Losartan  in AM and Amlodipine  in PM. -Get BMET done this week

## 2023-05-14 ENCOUNTER — Other Ambulatory Visit: Payer: Self-pay | Admitting: *Deleted

## 2023-05-14 DIAGNOSIS — Z79899 Other long term (current) drug therapy: Secondary | ICD-10-CM

## 2023-05-14 MED ORDER — LOSARTAN POTASSIUM 100 MG PO TABS: 100.0000 mg | ORAL_TABLET | Freq: Every day | ORAL | 3 refills | Status: DC

## 2023-05-14 NOTE — Telephone Encounter (Signed)
 Ok. BMET 1 month. Notify if BP > 140/90 x 3 Marlyse Single, PA-C    05/14/2023 1:14 PM

## 2023-05-14 NOTE — Telephone Encounter (Signed)
 Pt aware to increase the Losartan  to 100 mg taking in the mornings and to continue the Amlodipine  in the evening as per instructions previously.  Pt states he is limiting his salt intake and wearing compression stockings.

## 2023-05-14 NOTE — Addendum Note (Signed)
 Addended by: Angelina Kempf on: 05/14/2023 01:17 PM   Modules accepted: Orders

## 2023-05-20 ENCOUNTER — Inpatient Hospital Stay (HOSPITAL_BASED_OUTPATIENT_CLINIC_OR_DEPARTMENT_OTHER): Admitting: Oncology

## 2023-05-20 ENCOUNTER — Inpatient Hospital Stay: Attending: Oncology

## 2023-05-20 VITALS — BP 159/80 | HR 60 | Temp 98.1°F | Resp 18 | Ht 70.0 in | Wt 173.4 lb

## 2023-05-20 DIAGNOSIS — R319 Hematuria, unspecified: Secondary | ICD-10-CM | POA: Diagnosis not present

## 2023-05-20 DIAGNOSIS — I2510 Atherosclerotic heart disease of native coronary artery without angina pectoris: Secondary | ICD-10-CM | POA: Insufficient documentation

## 2023-05-20 DIAGNOSIS — D696 Thrombocytopenia, unspecified: Secondary | ICD-10-CM | POA: Diagnosis not present

## 2023-05-20 DIAGNOSIS — D649 Anemia, unspecified: Secondary | ICD-10-CM

## 2023-05-20 DIAGNOSIS — I4891 Unspecified atrial fibrillation: Secondary | ICD-10-CM | POA: Insufficient documentation

## 2023-05-20 DIAGNOSIS — Z7901 Long term (current) use of anticoagulants: Secondary | ICD-10-CM | POA: Diagnosis not present

## 2023-05-20 LAB — SAMPLE TO BLOOD BANK

## 2023-05-20 LAB — CBC WITH DIFFERENTIAL (CANCER CENTER ONLY)
Abs Immature Granulocytes: 0.02 10*3/uL (ref 0.00–0.07)
Basophils Absolute: 0 10*3/uL (ref 0.0–0.1)
Basophils Relative: 1 %
Eosinophils Absolute: 0.3 10*3/uL (ref 0.0–0.5)
Eosinophils Relative: 5 %
HCT: 30.9 % — ABNORMAL LOW (ref 39.0–52.0)
Hemoglobin: 9.9 g/dL — ABNORMAL LOW (ref 13.0–17.0)
Immature Granulocytes: 0 %
Lymphocytes Relative: 27 %
Lymphs Abs: 1.6 10*3/uL (ref 0.7–4.0)
MCH: 29.9 pg (ref 26.0–34.0)
MCHC: 32 g/dL (ref 30.0–36.0)
MCV: 93.4 fL (ref 80.0–100.0)
Monocytes Absolute: 0.8 10*3/uL (ref 0.1–1.0)
Monocytes Relative: 14 %
Neutro Abs: 3 10*3/uL (ref 1.7–7.7)
Neutrophils Relative %: 53 %
Platelet Count: 121 10*3/uL — ABNORMAL LOW (ref 150–400)
RBC: 3.31 MIL/uL — ABNORMAL LOW (ref 4.22–5.81)
RDW: 18.6 % — ABNORMAL HIGH (ref 11.5–15.5)
WBC Count: 5.7 10*3/uL (ref 4.0–10.5)
nRBC: 0 % (ref 0.0–0.2)

## 2023-05-20 NOTE — Progress Notes (Signed)
  [facility] OFFICE PROGRESS NOTE   Diagnosis: Anemia  INTERVAL HISTORY:   [name] returns as scheduled.  He generally feels well.  He had an episode of hematuria approximately 1 month ago.  He reports he was evaluated by urology.  The bladder was "flushed "and the hematuria resolved.  No other bleeding aside from easy bruising.  He has pruritus at the upper back.  He has been scratching. He saw neurology for "neuropathy "in the feet and had a lumbar MRI last month.  Objective:  Vital signs in last 24 hours:  Blood pressure (!) 159/80, pulse 60, temperature 98.1 F (36.7 C), temperature source Temporal, resp. rate 18, height 5\' 10"  (1.778 m), weight 173 lb 6.4 oz (78.7 kg), SpO2 100%.   Lymphatics: No cervical, supraclavicular, axillary, or inguinal nodes Resp: Lungs clear bilaterally Cardio: Irregular  vascular: No leg edema  Skin: Ecchymoses over the extremities, excoriations at the upper back.   Lab Results:  Lab Results  Component Value Date   WBC 5.7 05/20/2023   HGB 9.9 (L) 05/20/2023   HCT 30.9 (L) 05/20/2023   MCV 93.4 05/20/2023   PLT 121 (L) 05/20/2023   NEUTROABS 3.0 05/20/2023    CMP  Lab Results  Component Value Date   NA 144 05/03/2023   K 4.4 05/03/2023   CL 107 (H) 05/03/2023   CO2 21 05/03/2023   GLUCOSE 124 (H) 05/03/2023   BUN 15 05/03/2023   CREATININE 0.91 05/03/2023   CALCIUM  9.6 05/03/2023   PROT 7.0 03/14/2023   ALBUMIN  2.8 (L) 03/14/2023   AST 46 (H) 03/14/2023   ALT 27 03/14/2023   ALKPHOS 80 03/14/2023   BILITOT 0.7 03/14/2023   GFRNONAA >60 03/14/2023   GFRAA 83 11/25/2019    Medications: I have reviewed the patient's current medications.   Assessment/Plan: Anemia Normal ferritin, normal vitamin B12, and negative stool Hemoccult cards January 2023 Left hip fracture July 2022 status post left hip replacement  Hospitalization August 2020 with symptomatic anemia, heme positive stool, ferritin 19, status post  EGD;  colonoscopy 10/21/2018, gastritis with hemorrhage Atrial fibrillation on Coumadin  CAD AVR Mitral valve repair Left total knee replacement 09/19/2021 Admission with urinary retention 09/23/2021 Chronic mild thrombocytopenia Admission 02/02/2023 with respiratory failure, urosepsis,-anemia/thrombocytopenia requiring multiple transfusions while hospitalized, status post cystoscopy with right ureteral stent placement February 2025: Acute left frontal/temporal CVA Admission 03/09/2023 with E. coli bacteremia, status post cystoscopy with right ureteroscopy and laser application/stone extraction        Disposition: [name] appears well.  He is recovering from the March hospital admission with sepsis.  The hemoglobin is higher compared to when he left the hospital 2 months ago.  He has chronic mild thrombocytopenia.  He will call for symptoms of anemia.  [name] will return for an office and lab visit in 3 months.  The chronic anemia may be related to early myelodysplasia.  Coni Deep, MD  05/20/2023  10:47 AM

## 2023-05-21 ENCOUNTER — Telehealth: Payer: Self-pay | Admitting: Cardiology

## 2023-05-21 MED ORDER — LOSARTAN POTASSIUM 100 MG PO TABS: 100.0000 mg | ORAL_TABLET | Freq: Every day | ORAL | 3 refills | Status: DC

## 2023-05-21 NOTE — Telephone Encounter (Signed)
 Pt's medication was sent to pt's pharmacy as requested. Confirmation received.

## 2023-05-21 NOTE — Telephone Encounter (Signed)
*  STAT* If patient is at the pharmacy, call can be transferred to refill team.   1. Which medications need to be refilled? (please list name of each medication and dose if known) losartan  (COZAAR ) 100 MG tablet    2. Would you like to learn more about the convenience, safety, & potential cost savings by using the [facility]?    3. Are you open to using the [facility] (Type [facility]. ).   4. Which pharmacy/location (including street and city if local pharmacy) is medication to be sent to? [facility] [address] [place], [place] - [address]    5. Do they need a 30 day or 90 day supply? 90 day

## 2023-05-21 NOTE — Progress Notes (Unsigned)
 Electrophysiology Office Note:    Date:  05/22/2023   ID:  Gerald Bellows., DOB Dec 08, 1935, MRN [id number]  [facility] Cardiologist:  Dorothye Gathers, MD  [facility] Electrophysiologist:  Boyce Byes, MD   Referring MD: Gabino Joe, PA-C   Chief Complaint: Bradycardia  History of Present Illness:    [name] is an 88 year old man who I am seeing today for an evaluation of bradycardia at the request of Marlyse Single.  The patient last saw [name] March 27, 2023.  The patient has a history of a bioprosthetic aortic valve replacement and PFO closure in 2012.  This was followed by a mitral valve repair, tricuspid valve repair, maze, left atrial appendage ligation in January 2020.  His medical history also includes chronic diastolic heart failure, pulmonary hypertension, hypertension, permanent atrial fibrillation and flutter and strokes.  Also with a history of coronary disease, carotid artery stenosis, left total Ayers replacement.  At the appointment with [name] in March she reported intermittent lightheadedness.  Also intermittent dyspnea.  He takes Coumadin  for stroke prophylaxis.  He was bradycardic in the hospital earlier this year leading to the discontinuation of atenolol .  A heart monitor was ordered at the appointment with [name].  He is doing well today.  He uses a cane for stability.  No lightheadedness or dizziness.  His still recovering after recent hospitalization.  He follows his blood pressures closely at home.  He brings in a record today showing average daytime blood pressures in the 150s and 160s.  Standing blood pressures are in the 130s and 40s.     Their past medical, social and family history was reviewed.   ROS:   Please see the history of present illness.    All other systems reviewed and are negative.  EKGs/Labs/Other Studies Reviewed:    The following studies were reviewed today:  May 01, 2023 ZIO monitor personally reviewed Permanent atrial  fibrillation Heart rate 27-1 69, average 64 7 nonsustained ventricular tachycardia episodes, longest 11 beats  March 13, 2023 EKG shows probable sinus rhythm with low amplitude P waves and PACs  February 16, 2022 EKG shows sinus rhythm first-degree AV delay, PACs  March 21, 2023 echo EF 70% RV normal Severely dilated left and right atrium Prosthetic mitral valve gradient 13        Physical Exam:    VS:  BP (!) 166/80   Pulse 80   Ht 6' (1.829 m)   Wt 170 lb 3.2 oz (77.2 kg)   SpO2 96%   BMI 23.08 kg/m     Wt Readings from Last 3 Encounters:  05/22/23 170 lb 3.2 oz (77.2 kg)  05/20/23 173 lb 6.4 oz (78.7 kg)  04/23/23 167 lb (75.8 kg)     GEN: no distress.  Elderly CARD: Irregularly irregular, No MRG RESP: No IWOB. CTAB.        ASSESSMENT AND PLAN:    1. Atrial fibrillation, unspecified type (HCC)   2. Bradycardia   3. Essential hypertension     #Bradycardia #Atrial fibrillation Extensive valvular heart disease history including mitral valve replacement, tricuspid valve repair, left atrial appendage ligation, maze.  EKGs have demonstrated sinus rhythm but with very low amplitude P waves.  He has evidence of both bradycardia and some elevated rates at times on the recent heart monitor.  I am not entirely convinced that his symptoms are attributable to his heart rate fluctuations.  His average heart rate on the recent monitor is acceptable.  I would recommend a conservative management approach at this time.  If his symptoms worsen, if he develops presyncope or syncope, can revisit permanent pacemaker implant.  I discussed this in detail with the patient who is in complete agreement with his treatment plan.  Continue Coumadin  for stroke prophylaxis  #Hypertension Above goal today.  Recommend checking blood pressures 1-2 times per week at home and recording the values.  Recommend bringing these recordings to the primary care physician. Increase amlodipine  to 7.5 mg  by mouth once daily  Follow-up with EP on an as-needed basis    Signed, Donelda Fujita T. Marven Slimmer, MD, [name], [name] 05/22/2023 9:17 AM    Electrophysiology [facility]

## 2023-05-22 ENCOUNTER — Encounter: Payer: Self-pay | Admitting: Cardiology

## 2023-05-22 ENCOUNTER — Ambulatory Visit: Attending: Cardiology | Admitting: Cardiology

## 2023-05-22 VITALS — BP 166/80 | HR 80 | Ht 72.0 in | Wt 170.2 lb

## 2023-05-22 DIAGNOSIS — I4891 Unspecified atrial fibrillation: Secondary | ICD-10-CM | POA: Diagnosis not present

## 2023-05-22 DIAGNOSIS — R001 Bradycardia, unspecified: Secondary | ICD-10-CM

## 2023-05-22 DIAGNOSIS — I10 Essential (primary) hypertension: Secondary | ICD-10-CM

## 2023-05-22 MED ORDER — AMLODIPINE BESYLATE 5 MG PO TABS: 7.5000 mg | ORAL_TABLET | Freq: Every day | ORAL | 3 refills | Status: DC

## 2023-05-22 NOTE — Patient Instructions (Signed)
 Medication Instructions:  Your physician has recommended you make the following change in your medication:  1) INCREASE amlodipine  to 7.5 mg daily *If you need a refill on your cardiac medications before your next appointment, please call your pharmacy*  Follow-Up: At [facility], you and your health needs are our priority.  As part of our continuing mission to provide you with exceptional heart care, our providers are all part of one team.  This team includes your primary Cardiologist (physician) and Advanced Practice Providers or APPs (Physician Assistants and Nurse Practitioners) who all work together to provide you with the care you need, when you need it.  Your next appointment:   As needed with EP

## 2023-05-29 ENCOUNTER — Other Ambulatory Visit: Payer: Self-pay | Admitting: Cardiology

## 2023-05-29 DIAGNOSIS — D044 Carcinoma in situ of skin of scalp and neck: Secondary | ICD-10-CM | POA: Diagnosis not present

## 2023-05-29 DIAGNOSIS — C4442 Squamous cell carcinoma of skin of scalp and neck: Secondary | ICD-10-CM | POA: Diagnosis not present

## 2023-05-29 DIAGNOSIS — D485 Neoplasm of uncertain behavior of skin: Secondary | ICD-10-CM | POA: Diagnosis not present

## 2023-05-29 DIAGNOSIS — L309 Dermatitis, unspecified: Secondary | ICD-10-CM | POA: Diagnosis not present

## 2023-06-04 ENCOUNTER — Ambulatory Visit: Attending: Cardiology

## 2023-06-04 DIAGNOSIS — I4891 Unspecified atrial fibrillation: Secondary | ICD-10-CM | POA: Diagnosis not present

## 2023-06-04 DIAGNOSIS — Z79899 Other long term (current) drug therapy: Secondary | ICD-10-CM | POA: Diagnosis not present

## 2023-06-04 DIAGNOSIS — I639 Cerebral infarction, unspecified: Secondary | ICD-10-CM

## 2023-06-04 DIAGNOSIS — Z5181 Encounter for therapeutic drug level monitoring: Secondary | ICD-10-CM

## 2023-06-04 DIAGNOSIS — Z9889 Other specified postprocedural states: Secondary | ICD-10-CM

## 2023-06-04 DIAGNOSIS — Z7901 Long term (current) use of anticoagulants: Secondary | ICD-10-CM

## 2023-06-04 LAB — BASIC METABOLIC PANEL WITH GFR
BUN/Creatinine Ratio: 16 (ref 10–24)
BUN: 16 mg/dL (ref 8–27)
CO2: 22 mmol/L (ref 20–29)
Calcium: 9.3 mg/dL (ref 8.6–10.2)
Chloride: 106 mmol/L (ref 96–106)
Creatinine, Ser: 0.99 mg/dL (ref 0.76–1.27)
Glucose: 95 mg/dL (ref 70–99)
Potassium: 4 mmol/L (ref 3.5–5.2)
Sodium: 142 mmol/L (ref 134–144)
eGFR: 74 mL/min/{1.73_m2} (ref >59)

## 2023-06-04 LAB — POCT INR: INR: 1.8 — AB (ref 2.0–3.0)

## 2023-06-04 NOTE — Patient Instructions (Signed)
 Take another 0.5 tablet today only then Continue taking warfarin 1 tablet daily except for 1.5 tablets on Mondays.  Stay consistent with greens each week. Eat Greens Monday, Wednesday and Friday Recheck INR in 5 weeks.  Coumadin  Clinic [phone]

## 2023-06-05 ENCOUNTER — Ambulatory Visit: Payer: Self-pay | Admitting: Physician Assistant

## 2023-06-07 DIAGNOSIS — G629 Polyneuropathy, unspecified: Secondary | ICD-10-CM | POA: Diagnosis not present

## 2023-06-07 DIAGNOSIS — D696 Thrombocytopenia, unspecified: Secondary | ICD-10-CM | POA: Diagnosis not present

## 2023-06-07 DIAGNOSIS — Z7901 Long term (current) use of anticoagulants: Secondary | ICD-10-CM | POA: Diagnosis not present

## 2023-06-07 DIAGNOSIS — D6869 Other thrombophilia: Secondary | ICD-10-CM | POA: Diagnosis not present

## 2023-06-07 DIAGNOSIS — Z8673 Personal history of transient ischemic attack (TIA), and cerebral infarction without residual deficits: Secondary | ICD-10-CM | POA: Diagnosis not present

## 2023-06-07 DIAGNOSIS — I5032 Chronic diastolic (congestive) heart failure: Secondary | ICD-10-CM | POA: Diagnosis not present

## 2023-06-07 DIAGNOSIS — D649 Anemia, unspecified: Secondary | ICD-10-CM | POA: Diagnosis not present

## 2023-06-07 DIAGNOSIS — I10 Essential (primary) hypertension: Secondary | ICD-10-CM | POA: Diagnosis not present

## 2023-06-07 DIAGNOSIS — Z6824 Body mass index (BMI) 24.0-24.9, adult: Secondary | ICD-10-CM | POA: Diagnosis not present

## 2023-06-10 NOTE — Progress Notes (Signed)
 Pt has been made aware of normal result and verbalized understanding.  jw

## 2023-06-12 DIAGNOSIS — D649 Anemia, unspecified: Secondary | ICD-10-CM | POA: Diagnosis not present

## 2023-06-13 ENCOUNTER — Ambulatory Visit: Payer: Medicare Other | Admitting: Adult Health

## 2023-06-21 ENCOUNTER — Other Ambulatory Visit: Payer: Self-pay | Admitting: Cardiology

## 2023-06-24 DIAGNOSIS — C4442 Squamous cell carcinoma of skin of scalp and neck: Secondary | ICD-10-CM | POA: Diagnosis not present

## 2023-06-25 DIAGNOSIS — Z6823 Body mass index (BMI) 23.0-23.9, adult: Secondary | ICD-10-CM | POA: Diagnosis not present

## 2023-06-25 DIAGNOSIS — Z23 Encounter for immunization: Secondary | ICD-10-CM | POA: Diagnosis not present

## 2023-06-25 DIAGNOSIS — I10 Essential (primary) hypertension: Secondary | ICD-10-CM | POA: Diagnosis not present

## 2023-06-25 DIAGNOSIS — Z7901 Long term (current) use of anticoagulants: Secondary | ICD-10-CM | POA: Diagnosis not present

## 2023-06-25 DIAGNOSIS — D696 Thrombocytopenia, unspecified: Secondary | ICD-10-CM | POA: Diagnosis not present

## 2023-06-25 DIAGNOSIS — Z0000 Encounter for general adult medical examination without abnormal findings: Secondary | ICD-10-CM | POA: Diagnosis not present

## 2023-06-25 DIAGNOSIS — I5032 Chronic diastolic (congestive) heart failure: Secondary | ICD-10-CM | POA: Diagnosis not present

## 2023-06-25 DIAGNOSIS — Z8673 Personal history of transient ischemic attack (TIA), and cerebral infarction without residual deficits: Secondary | ICD-10-CM | POA: Diagnosis not present

## 2023-06-25 DIAGNOSIS — D6869 Other thrombophilia: Secondary | ICD-10-CM | POA: Diagnosis not present

## 2023-06-25 DIAGNOSIS — D649 Anemia, unspecified: Secondary | ICD-10-CM | POA: Diagnosis not present

## 2023-07-08 DIAGNOSIS — I4891 Unspecified atrial fibrillation: Secondary | ICD-10-CM | POA: Diagnosis not present

## 2023-07-08 DIAGNOSIS — I10 Essential (primary) hypertension: Secondary | ICD-10-CM | POA: Diagnosis not present

## 2023-07-09 ENCOUNTER — Ambulatory Visit: Attending: Cardiology | Admitting: *Deleted

## 2023-07-09 DIAGNOSIS — Z9889 Other specified postprocedural states: Secondary | ICD-10-CM

## 2023-07-09 DIAGNOSIS — Z7901 Long term (current) use of anticoagulants: Secondary | ICD-10-CM | POA: Diagnosis not present

## 2023-07-09 DIAGNOSIS — Z5181 Encounter for therapeutic drug level monitoring: Secondary | ICD-10-CM | POA: Diagnosis not present

## 2023-07-09 DIAGNOSIS — I4891 Unspecified atrial fibrillation: Secondary | ICD-10-CM | POA: Diagnosis not present

## 2023-07-09 DIAGNOSIS — I639 Cerebral infarction, unspecified: Secondary | ICD-10-CM | POA: Diagnosis not present

## 2023-07-09 LAB — POCT INR: INR: 3 (ref 2.0–3.0)

## 2023-07-09 NOTE — Patient Instructions (Signed)
 Description   Continue taking warfarin 1 tablet daily except for 1.5 tablets on Mondays.  Stay consistent with greens each week. Eat Greens Monday, Wednesday and Friday Recheck INR in 6 weeks.  Coumadin  Clinic [phone]

## 2023-07-09 NOTE — Progress Notes (Signed)
Please see anticoagulation encounter.

## 2023-07-18 ENCOUNTER — Other Ambulatory Visit (HOSPITAL_COMMUNITY): Payer: Self-pay | Admitting: Adult Health

## 2023-07-18 DIAGNOSIS — D4101 Neoplasm of uncertain behavior of right kidney: Secondary | ICD-10-CM

## 2023-07-23 ENCOUNTER — Encounter (HOSPITAL_COMMUNITY): Payer: Self-pay

## 2023-07-24 ENCOUNTER — Ambulatory Visit (HOSPITAL_COMMUNITY)
Admission: RE | Admit: 2023-07-24 | Discharge: 2023-07-24 | Disposition: A | Discharge status: Home / Self Care | Source: Ambulatory Visit | Attending: Adult Health | Admitting: Adult Health

## 2023-07-24 DIAGNOSIS — D4101 Neoplasm of uncertain behavior of right kidney: Secondary | ICD-10-CM | POA: Insufficient documentation

## 2023-07-24 DIAGNOSIS — N281 Cyst of kidney, acquired: Secondary | ICD-10-CM | POA: Diagnosis not present

## 2023-07-24 DIAGNOSIS — K5730 Diverticulosis of large intestine without perforation or abscess without bleeding: Secondary | ICD-10-CM | POA: Diagnosis not present

## 2023-07-24 DIAGNOSIS — N289 Disorder of kidney and ureter, unspecified: Secondary | ICD-10-CM | POA: Diagnosis not present

## 2023-07-24 MED ORDER — GADOBUTROL 1 MMOL/ML IV SOLN
8.0000 mL | Freq: Once | INTRAVENOUS | Status: AC | PRN
  Administered 2023-07-24: 8 mL via INTRAVENOUS

## 2023-08-06 DIAGNOSIS — R413 Other amnesia: Secondary | ICD-10-CM | POA: Diagnosis not present

## 2023-08-06 DIAGNOSIS — I5032 Chronic diastolic (congestive) heart failure: Secondary | ICD-10-CM | POA: Diagnosis not present

## 2023-08-06 DIAGNOSIS — D649 Anemia, unspecified: Secondary | ICD-10-CM | POA: Diagnosis not present

## 2023-08-06 DIAGNOSIS — Z8673 Personal history of transient ischemic attack (TIA), and cerebral infarction without residual deficits: Secondary | ICD-10-CM | POA: Diagnosis not present

## 2023-08-08 DIAGNOSIS — I10 Essential (primary) hypertension: Secondary | ICD-10-CM | POA: Diagnosis not present

## 2023-08-08 DIAGNOSIS — I4891 Unspecified atrial fibrillation: Secondary | ICD-10-CM | POA: Diagnosis not present

## 2023-08-14 DIAGNOSIS — R3912 Poor urinary stream: Secondary | ICD-10-CM | POA: Diagnosis not present

## 2023-08-20 ENCOUNTER — Ambulatory Visit: Attending: Cardiology

## 2023-08-20 DIAGNOSIS — I639 Cerebral infarction, unspecified: Secondary | ICD-10-CM | POA: Diagnosis not present

## 2023-08-20 DIAGNOSIS — I4891 Unspecified atrial fibrillation: Secondary | ICD-10-CM | POA: Diagnosis not present

## 2023-08-20 DIAGNOSIS — Z9889 Other specified postprocedural states: Secondary | ICD-10-CM | POA: Diagnosis not present

## 2023-08-20 DIAGNOSIS — Z5181 Encounter for therapeutic drug level monitoring: Secondary | ICD-10-CM | POA: Diagnosis not present

## 2023-08-20 DIAGNOSIS — Z7901 Long term (current) use of anticoagulants: Secondary | ICD-10-CM

## 2023-08-20 LAB — POCT INR: INR: 3 (ref 2.0–3.0)

## 2023-08-20 NOTE — Progress Notes (Signed)
 INR 3.0; Please see anticoagulation encounter

## 2023-08-20 NOTE — Patient Instructions (Signed)
 Description   Continue taking warfarin 1 tablet daily except for 1.5 tablets on Mondays.  Stay consistent with greens each week. Eat Greens Monday, Wednesday and Friday Recheck INR in 6 weeks.  Coumadin  Clinic [phone]

## 2023-08-21 ENCOUNTER — Telehealth: Payer: Self-pay | Admitting: Oncology

## 2023-08-21 ENCOUNTER — Inpatient Hospital Stay: Admitting: Oncology

## 2023-08-21 ENCOUNTER — Inpatient Hospital Stay: Attending: Oncology

## 2023-08-21 VITALS — BP 161/78 | HR 64 | Temp 98.1°F | Resp 18 | Ht 72.0 in | Wt 173.7 lb

## 2023-08-21 DIAGNOSIS — D649 Anemia, unspecified: Secondary | ICD-10-CM | POA: Insufficient documentation

## 2023-08-21 DIAGNOSIS — I4891 Unspecified atrial fibrillation: Secondary | ICD-10-CM | POA: Diagnosis not present

## 2023-08-21 DIAGNOSIS — Z96642 Presence of left artificial hip joint: Secondary | ICD-10-CM | POA: Diagnosis not present

## 2023-08-21 DIAGNOSIS — I2510 Atherosclerotic heart disease of native coronary artery without angina pectoris: Secondary | ICD-10-CM | POA: Diagnosis not present

## 2023-08-21 DIAGNOSIS — R2242 Localized swelling, mass and lump, left lower limb: Secondary | ICD-10-CM | POA: Insufficient documentation

## 2023-08-21 DIAGNOSIS — D696 Thrombocytopenia, unspecified: Secondary | ICD-10-CM | POA: Insufficient documentation

## 2023-08-21 LAB — CBC WITH DIFFERENTIAL (CANCER CENTER ONLY)
Abs Immature Granulocytes: 0.01 K/uL (ref 0.00–0.07)
Basophils Absolute: 0 K/uL (ref 0.0–0.1)
Basophils Relative: 1 %
Eosinophils Absolute: 0.3 K/uL (ref 0.0–0.5)
Eosinophils Relative: 5 %
HCT: 31.3 % — ABNORMAL LOW (ref 39.0–52.0)
Hemoglobin: 10.3 g/dL — ABNORMAL LOW (ref 13.0–17.0)
Immature Granulocytes: 0 %
Lymphocytes Relative: 28 %
Lymphs Abs: 1.5 K/uL (ref 0.7–4.0)
MCH: 31.1 pg (ref 26.0–34.0)
MCHC: 32.9 g/dL (ref 30.0–36.0)
MCV: 94.6 fL (ref 80.0–100.0)
Monocytes Absolute: 0.6 K/uL (ref 0.1–1.0)
Monocytes Relative: 11 %
Neutro Abs: 3 K/uL (ref 1.7–7.7)
Neutrophils Relative %: 55 %
Platelet Count: 128 K/uL — ABNORMAL LOW (ref 150–400)
RBC: 3.31 MIL/uL — ABNORMAL LOW (ref 4.22–5.81)
RDW: 14.1 % (ref 11.5–15.5)
WBC Count: 5.5 K/uL (ref 4.0–10.5)
nRBC: 0 % (ref 0.0–0.2)

## 2023-08-21 NOTE — Progress Notes (Signed)
  [facility] OFFICE PROGRESS NOTE   Diagnosis: Anemia  INTERVAL HISTORY:   [name] returns as scheduled.  Good appetite and energy level.  No bleeding.  He complains of leg weakness that progresses throughout the day.  Objective:  Vital signs in last 24 hours:  Blood pressure (!) 161/78, pulse 64, temperature 98.1 F (36.7 C), temperature source Temporal, resp. rate 18, height 6' (1.829 m), weight 173 lb 11.2 oz (78.8 kg), SpO2 99%.   Lymphatics: No cervical, supraclavicular, axillary, or inguinal nodes Resp: Lungs clear bilaterally Cardio: Regular rhythm with an occasional pause GI: No hepatosplenomegaly Vascular: No leg edema, 1.5-2 cm nodular area with an overlying vein at the medial left lower leg Neurologic: The leg and foot strength appear intact straight leg raise and dorsiflexion bilaterally   Lab Results:  Lab Results  Component Value Date   WBC 5.5 08/21/2023   HGB 10.3 (L) 08/21/2023   HCT 31.3 (L) 08/21/2023   MCV 94.6 08/21/2023   PLT PENDING 08/21/2023   NEUTROABS 3.0 08/21/2023    CMP  Lab Results  Component Value Date   NA 142 06/04/2023   K 4.0 06/04/2023   CL 106 06/04/2023   CO2 22 06/04/2023   GLUCOSE 95 06/04/2023   BUN 16 06/04/2023   CREATININE 0.99 06/04/2023   CALCIUM  9.3 06/04/2023   PROT 7.0 03/14/2023   ALBUMIN  2.8 (L) 03/14/2023   AST 46 (H) 03/14/2023   ALT 27 03/14/2023   ALKPHOS 80 03/14/2023   BILITOT 0.7 03/14/2023   GFRNONAA >60 03/14/2023   GFRAA 83 11/25/2019    Medications: I have reviewed the patient's current medications.   Assessment/Plan: Anemia Normal ferritin, normal vitamin B12, and negative stool Hemoccult cards January 2023 Left hip fracture July 2022 status post left hip replacement  Hospitalization August 2020 with symptomatic anemia, heme positive stool, ferritin 19, status post EGD;  colonoscopy 10/21/2018, gastritis with hemorrhage Atrial fibrillation on Coumadin  CAD AVR Mitral  valve repair Left total knee replacement 09/19/2021 Admission with urinary retention 09/23/2021 Chronic mild thrombocytopenia Admission 02/02/2023 with respiratory failure, urosepsis,-anemia/thrombocytopenia requiring multiple transfusions while hospitalized, status post cystoscopy with right ureteral stent placement February 2025: Acute left frontal/temporal CVA Admission 03/09/2023 with E. coli bacteremia, status post cystoscopy with right ureteroscopy and laser application/stone extraction       Disposition: [name] appears stable.  The hemoglobin is higher compared to when he was here in May.  He will call for symptoms of anemia.  The anemia may be related to chronic disease, subclinical bleeding, or myelodysplasia.  He will return for an office visit and CBC in 3 months.  The nodular lesion at the medial proximal aspect of the left lower leg may be a thrombosed varicosity or cyst.  We discussed obtaining a Doppler.  He prefers observation.  He will call if the lesion enlarges or becomes painful. Arley Hof, MD  08/21/2023  8:44 AM

## 2023-08-21 NOTE — Telephone Encounter (Addendum)
 Patient has been scheduled for follow-up visit per 08/21/23 LOS.  Pt aware of scheduled appt details.

## 2023-08-22 DIAGNOSIS — L814 Other melanin hyperpigmentation: Secondary | ICD-10-CM | POA: Diagnosis not present

## 2023-08-22 DIAGNOSIS — D229 Melanocytic nevi, unspecified: Secondary | ICD-10-CM | POA: Diagnosis not present

## 2023-08-22 DIAGNOSIS — L578 Other skin changes due to chronic exposure to nonionizing radiation: Secondary | ICD-10-CM | POA: Diagnosis not present

## 2023-08-22 DIAGNOSIS — L821 Other seborrheic keratosis: Secondary | ICD-10-CM | POA: Diagnosis not present

## 2023-08-22 DIAGNOSIS — Z85828 Personal history of other malignant neoplasm of skin: Secondary | ICD-10-CM | POA: Diagnosis not present

## 2023-08-22 DIAGNOSIS — L988 Other specified disorders of the skin and subcutaneous tissue: Secondary | ICD-10-CM | POA: Diagnosis not present

## 2023-08-22 DIAGNOSIS — L570 Actinic keratosis: Secondary | ICD-10-CM | POA: Diagnosis not present

## 2023-08-27 ENCOUNTER — Other Ambulatory Visit: Payer: Self-pay

## 2023-08-27 DIAGNOSIS — L988 Other specified disorders of the skin and subcutaneous tissue: Secondary | ICD-10-CM | POA: Diagnosis not present

## 2023-09-08 DIAGNOSIS — I4891 Unspecified atrial fibrillation: Secondary | ICD-10-CM | POA: Diagnosis not present

## 2023-09-08 DIAGNOSIS — I10 Essential (primary) hypertension: Secondary | ICD-10-CM | POA: Diagnosis not present

## 2023-09-12 ENCOUNTER — Other Ambulatory Visit: Payer: Self-pay | Admitting: Physician Assistant

## 2023-09-29 ENCOUNTER — Emergency Department (HOSPITAL_COMMUNITY)

## 2023-09-29 ENCOUNTER — Encounter (HOSPITAL_COMMUNITY): Payer: Self-pay | Admitting: Internal Medicine

## 2023-09-29 ENCOUNTER — Other Ambulatory Visit: Payer: Self-pay

## 2023-09-29 ENCOUNTER — Inpatient Hospital Stay (HOSPITAL_COMMUNITY)
Admission: EM | Admit: 2023-09-29 | Discharge: 2023-10-01 | DRG: 389 | Disposition: A | Discharge status: Home / Self Care | Attending: Internal Medicine | Admitting: Internal Medicine

## 2023-09-29 DIAGNOSIS — K5730 Diverticulosis of large intestine without perforation or abscess without bleeding: Secondary | ICD-10-CM | POA: Diagnosis not present

## 2023-09-29 DIAGNOSIS — Z8673 Personal history of transient ischemic attack (TIA), and cerebral infarction without residual deficits: Secondary | ICD-10-CM

## 2023-09-29 DIAGNOSIS — E860 Dehydration: Secondary | ICD-10-CM | POA: Diagnosis present

## 2023-09-29 DIAGNOSIS — R7303 Prediabetes: Secondary | ICD-10-CM | POA: Diagnosis present

## 2023-09-29 DIAGNOSIS — Z953 Presence of xenogenic heart valve: Secondary | ICD-10-CM | POA: Diagnosis not present

## 2023-09-29 DIAGNOSIS — Z808 Family history of malignant neoplasm of other organs or systems: Secondary | ICD-10-CM | POA: Diagnosis not present

## 2023-09-29 DIAGNOSIS — E7800 Pure hypercholesterolemia, unspecified: Secondary | ICD-10-CM | POA: Diagnosis not present

## 2023-09-29 DIAGNOSIS — I517 Cardiomegaly: Secondary | ICD-10-CM | POA: Diagnosis not present

## 2023-09-29 DIAGNOSIS — I4821 Permanent atrial fibrillation: Secondary | ICD-10-CM | POA: Diagnosis present

## 2023-09-29 DIAGNOSIS — I700 Atherosclerosis of aorta: Secondary | ICD-10-CM | POA: Diagnosis not present

## 2023-09-29 DIAGNOSIS — Z8249 Family history of ischemic heart disease and other diseases of the circulatory system: Secondary | ICD-10-CM

## 2023-09-29 DIAGNOSIS — I5030 Unspecified diastolic (congestive) heart failure: Secondary | ICD-10-CM | POA: Diagnosis present

## 2023-09-29 DIAGNOSIS — Z79899 Other long term (current) drug therapy: Secondary | ICD-10-CM

## 2023-09-29 DIAGNOSIS — I050 Rheumatic mitral stenosis: Secondary | ICD-10-CM | POA: Diagnosis present

## 2023-09-29 DIAGNOSIS — R509 Fever, unspecified: Secondary | ICD-10-CM | POA: Diagnosis not present

## 2023-09-29 DIAGNOSIS — I10 Essential (primary) hypertension: Secondary | ICD-10-CM | POA: Diagnosis present

## 2023-09-29 DIAGNOSIS — Z96653 Presence of artificial knee joint, bilateral: Secondary | ICD-10-CM | POA: Diagnosis present

## 2023-09-29 DIAGNOSIS — I5032 Chronic diastolic (congestive) heart failure: Secondary | ICD-10-CM | POA: Diagnosis present

## 2023-09-29 DIAGNOSIS — Z9889 Other specified postprocedural states: Secondary | ICD-10-CM | POA: Diagnosis not present

## 2023-09-29 DIAGNOSIS — I2510 Atherosclerotic heart disease of native coronary artery without angina pectoris: Secondary | ICD-10-CM | POA: Diagnosis present

## 2023-09-29 DIAGNOSIS — Z7901 Long term (current) use of anticoagulants: Secondary | ICD-10-CM | POA: Diagnosis not present

## 2023-09-29 DIAGNOSIS — Z961 Presence of intraocular lens: Secondary | ICD-10-CM | POA: Diagnosis present

## 2023-09-29 DIAGNOSIS — I639 Cerebral infarction, unspecified: Secondary | ICD-10-CM | POA: Diagnosis present

## 2023-09-29 DIAGNOSIS — R197 Diarrhea, unspecified: Principal | ICD-10-CM

## 2023-09-29 DIAGNOSIS — Z952 Presence of prosthetic heart valve: Secondary | ICD-10-CM | POA: Diagnosis not present

## 2023-09-29 DIAGNOSIS — Z1152 Encounter for screening for COVID-19: Secondary | ICD-10-CM

## 2023-09-29 DIAGNOSIS — R112 Nausea with vomiting, unspecified: Principal | ICD-10-CM

## 2023-09-29 DIAGNOSIS — I509 Heart failure, unspecified: Secondary | ICD-10-CM

## 2023-09-29 DIAGNOSIS — R531 Weakness: Secondary | ICD-10-CM | POA: Diagnosis not present

## 2023-09-29 DIAGNOSIS — N401 Enlarged prostate with lower urinary tract symptoms: Secondary | ICD-10-CM | POA: Diagnosis present

## 2023-09-29 DIAGNOSIS — E870 Hyperosmolality and hypernatremia: Secondary | ICD-10-CM | POA: Diagnosis not present

## 2023-09-29 DIAGNOSIS — E785 Hyperlipidemia, unspecified: Secondary | ICD-10-CM | POA: Diagnosis not present

## 2023-09-29 DIAGNOSIS — E876 Hypokalemia: Secondary | ICD-10-CM | POA: Diagnosis present

## 2023-09-29 DIAGNOSIS — K56609 Unspecified intestinal obstruction, unspecified as to partial versus complete obstruction: Secondary | ICD-10-CM | POA: Diagnosis not present

## 2023-09-29 DIAGNOSIS — Z66 Do not resuscitate: Secondary | ICD-10-CM | POA: Diagnosis present

## 2023-09-29 DIAGNOSIS — M109 Gout, unspecified: Secondary | ICD-10-CM | POA: Diagnosis present

## 2023-09-29 DIAGNOSIS — Z882 Allergy status to sulfonamides status: Secondary | ICD-10-CM

## 2023-09-29 DIAGNOSIS — I110 Hypertensive heart disease with heart failure: Secondary | ICD-10-CM | POA: Diagnosis present

## 2023-09-29 DIAGNOSIS — Z888 Allergy status to other drugs, medicaments and biological substances status: Secondary | ICD-10-CM

## 2023-09-29 DIAGNOSIS — D638 Anemia in other chronic diseases classified elsewhere: Secondary | ICD-10-CM | POA: Diagnosis not present

## 2023-09-29 DIAGNOSIS — G629 Polyneuropathy, unspecified: Secondary | ICD-10-CM | POA: Diagnosis present

## 2023-09-29 DIAGNOSIS — Z96642 Presence of left artificial hip joint: Secondary | ICD-10-CM | POA: Diagnosis present

## 2023-09-29 DIAGNOSIS — Z8774 Personal history of (corrected) congenital malformations of heart and circulatory system: Secondary | ICD-10-CM

## 2023-09-29 DIAGNOSIS — I38 Endocarditis, valve unspecified: Secondary | ICD-10-CM | POA: Diagnosis present

## 2023-09-29 DIAGNOSIS — Z8679 Personal history of other diseases of the circulatory system: Secondary | ICD-10-CM | POA: Diagnosis not present

## 2023-09-29 DIAGNOSIS — I2729 Other secondary pulmonary hypertension: Secondary | ICD-10-CM | POA: Diagnosis not present

## 2023-09-29 DIAGNOSIS — Z8616 Personal history of COVID-19: Secondary | ICD-10-CM

## 2023-09-29 DIAGNOSIS — Z4682 Encounter for fitting and adjustment of non-vascular catheter: Secondary | ICD-10-CM | POA: Diagnosis not present

## 2023-09-29 HISTORY — DX: Hyperglycemia, unspecified: R73.9

## 2023-09-29 LAB — URINALYSIS, ROUTINE W REFLEX MICROSCOPIC
Bilirubin Urine: NEGATIVE
Glucose, UA: NEGATIVE mg/dL
Hgb urine dipstick: NEGATIVE
Ketones, ur: NEGATIVE mg/dL
Nitrite: POSITIVE — AB
Protein, ur: NEGATIVE mg/dL
Specific Gravity, Urine: 1.028 (ref 1.005–1.030)
WBC, UA: >50 WBC/hpf (ref 0–5)
pH: 5 (ref 5.0–8.0)

## 2023-09-29 LAB — CBC WITH DIFFERENTIAL/PLATELET
Abs Immature Granulocytes: 0.01 K/uL (ref 0.00–0.07)
Basophils Absolute: 0 K/uL (ref 0.0–0.1)
Basophils Relative: 0 %
Eosinophils Absolute: 0 K/uL (ref 0.0–0.5)
Eosinophils Relative: 1 %
HCT: 36.6 % — ABNORMAL LOW (ref 39.0–52.0)
Hemoglobin: 11.3 g/dL — ABNORMAL LOW (ref 13.0–17.0)
Immature Granulocytes: 0 %
Lymphocytes Relative: 4 %
Lymphs Abs: 0.3 K/uL — ABNORMAL LOW (ref 0.7–4.0)
MCH: 30.1 pg (ref 26.0–34.0)
MCHC: 30.9 g/dL (ref 30.0–36.0)
MCV: 97.3 fL (ref 80.0–100.0)
Monocytes Absolute: 0.5 K/uL (ref 0.1–1.0)
Monocytes Relative: 8 %
Neutro Abs: 5.1 K/uL (ref 1.7–7.7)
Neutrophils Relative %: 87 %
Platelets: 100 K/uL — ABNORMAL LOW (ref 150–400)
RBC: 3.76 MIL/uL — ABNORMAL LOW (ref 4.22–5.81)
RDW: 14 % (ref 11.5–15.5)
WBC: 5.9 K/uL (ref 4.0–10.5)
nRBC: 0 % (ref 0.0–0.2)

## 2023-09-29 LAB — COMPREHENSIVE METABOLIC PANEL WITH GFR
ALT: 20 U/L (ref 0–44)
AST: 24 U/L (ref 15–41)
Albumin: 4.3 g/dL (ref 3.5–5.0)
Alkaline Phosphatase: 72 U/L (ref 38–126)
Anion gap: 15 (ref 5–15)
BUN: 21 mg/dL (ref 8–23)
CO2: 20 mmol/L — ABNORMAL LOW (ref 22–32)
Calcium: 9.3 mg/dL (ref 8.9–10.3)
Chloride: 109 mmol/L (ref 98–111)
Creatinine, Ser: 1.02 mg/dL (ref 0.61–1.24)
GFR, Estimated: >60 mL/min (ref >60)
Glucose, Bld: 139 mg/dL — ABNORMAL HIGH (ref 70–99)
Potassium: 3.8 mmol/L (ref 3.5–5.1)
Sodium: 144 mmol/L (ref 135–145)
Total Bilirubin: 0.9 mg/dL (ref 0.0–1.2)
Total Protein: 7.4 g/dL (ref 6.5–8.1)

## 2023-09-29 LAB — I-STAT CG4 LACTIC ACID, ED: Lactic Acid, Venous: 1.3 mmol/L (ref 0.5–1.9)

## 2023-09-29 LAB — PROTIME-INR
INR: 2.7 — ABNORMAL HIGH (ref 0.8–1.2)
Prothrombin Time: 30.3 s — ABNORMAL HIGH (ref 11.4–15.2)

## 2023-09-29 LAB — RESP PANEL BY RT-PCR (RSV, FLU A&B, COVID)  RVPGX2
Influenza A by PCR: NEGATIVE
Influenza B by PCR: NEGATIVE
Resp Syncytial Virus by PCR: NEGATIVE
SARS Coronavirus 2 by RT PCR: NEGATIVE

## 2023-09-29 LAB — LIPASE, BLOOD: Lipase: 17 U/L (ref 11–51)

## 2023-09-29 MED ORDER — SODIUM CHLORIDE 0.9 % IV BOLUS
1000.0000 mL | Freq: Once | INTRAVENOUS | Status: AC
  Administered 2023-09-29: 1000 mL via INTRAVENOUS

## 2023-09-29 MED ORDER — IOHEXOL 300 MG/ML  SOLN
100.0000 mL | Freq: Once | INTRAMUSCULAR | Status: AC | PRN
  Administered 2023-09-29: 100 mL via INTRAVENOUS

## 2023-09-29 MED ORDER — SODIUM CHLORIDE 0.9% FLUSH
3.0000 mL | Freq: Two times a day (BID) | INTRAVENOUS | Status: DC
  Administered 2023-09-29 – 2023-10-01 (×3): 3 mL via INTRAVENOUS

## 2023-09-29 MED ORDER — ONDANSETRON HCL 4 MG/2ML IJ SOLN
4.0000 mg | Freq: Once | INTRAMUSCULAR | Status: AC
  Administered 2023-09-29: 4 mg via INTRAVENOUS
  Filled 2023-09-29: qty 2

## 2023-09-29 MED ORDER — INFLUENZA VAC SPLIT HIGH-DOSE 0.5 ML IM SUSY
0.5000 mL | PREFILLED_SYRINGE | INTRAMUSCULAR | Status: DC
  Filled 2023-09-29: qty 0.5

## 2023-09-29 MED ORDER — ORAL CARE MOUTH RINSE: 15.0000 mL | OROMUCOSAL | Status: DC | PRN

## 2023-09-29 MED ORDER — DIATRIZOATE MEGLUMINE & SODIUM 66-10 % PO SOLN
90.0000 mL | Freq: Once | ORAL | Status: AC
  Administered 2023-09-30: 90 mL via NASOGASTRIC
  Filled 2023-09-29: qty 90

## 2023-09-29 MED ORDER — ACETAMINOPHEN 650 MG RE SUPP
650.0000 mg | Freq: Four times a day (QID) | RECTAL | Status: DC | PRN
  Administered 2023-09-30: 650 mg via RECTAL
  Filled 2023-09-29: qty 1

## 2023-09-29 MED ORDER — ACETAMINOPHEN 325 MG PO TABS: 650.0000 mg | ORAL_TABLET | Freq: Four times a day (QID) | ORAL | Status: DC | PRN

## 2023-09-29 MED ORDER — ACETAMINOPHEN 500 MG PO TABS
1000.0000 mg | ORAL_TABLET | Freq: Once | ORAL | Status: DC
  Filled 2023-09-29: qty 2

## 2023-09-29 NOTE — ED Triage Notes (Signed)
 Arrived by EMS from home with nausea and vomiting that started today. Patient states he felt fine yesterday and only had oatmeal for breakfast this morning. Denies any pain A&O X4 NAD.

## 2023-09-29 NOTE — ED Provider Notes (Signed)
 [place] EMERGENCY DEPARTMENT AT [facility] Provider Note   CSN: [id number] Arrival date & time: 09/29/23  1424     Patient presents with: Nausea and Vomiting   [name]. is a 88 y.o. male.   The history is provided by the patient, the EMS personnel and medical records. No language interpreter was used.     88 year old male with multiple comorbidity which includes history of SBO, CAD, diabetes, prior stroke, A-fib on Coumadin , has mitral valve repair, aortic valve replacement was brought here via EMS from home with complaints of nausea vomiting.  Patient reported this morning he ate some oatmeal for breakfast but shortly after that he felt nauseous, has had several episodes of bloody nonbilious vomiting as well as having some loose stools.  He also endorsed having some chills without any fever.  He does not endorse any runny nose sneezing or coughing no sore throat no chest pain or shortness of breath no abdominal pain no urinary discomfort.  Denies any recent sick contact but states that his son did brought his grandson over yesterday but they do not have any symptoms.  No recent medication changes.  Prior to Admission medications   Medication Sig Start Date End Date Taking? Authorizing Provider  amLODipine  (NORVASC ) 5 MG tablet Take 1.5 tablets (7.5 mg total) by mouth daily. 05/22/23   Cindie Ole DASEN, MD  cholecalciferol  (VITAMIN D3) 25 MCG (1000 UNIT) tablet Take 1,000 Units by mouth every evening.    [provider]  Cinnamon 500 MG capsule Take 500 mg by mouth daily.    [provider]  finasteride  (PROSCAR ) 5 MG tablet Take 1 tablet (5 mg total) by mouth daily. 11/05/16   Angiulli, Toribio JINNY, PA-C  losartan  (COZAAR ) 100 MG tablet Take 1 tablet (100 mg total) by mouth daily. 05/21/23   Jeffrie Oneil BROCKS, MD  magnesium  hydroxide (MILK OF MAGNESIA) 400 MG/5ML suspension Take 30 mLs by mouth daily as needed for mild constipation.    [provider]  Multiple Vitamins-Minerals (CENTRUM SILVER ADULT 50+) TABS Take 1 tablet by mouth daily.    [provider]  rosuvastatin  (CRESTOR ) 10 MG tablet TAKE 1 TABLET BY MOUTH DAILY 05/30/23   Cindie Ole DASEN, MD  tamsulosin  (FLOMAX ) 0.4 MG CAPS capsule Take 0.4 mg by mouth at bedtime. 08/14/23   [provider]  traZODone  (DESYREL ) 50 MG tablet Take 25 mg by mouth at bedtime as needed for sleep.    [provider]  warfarin (COUMADIN ) 5 MG tablet Take 1 Tablet by mouth daily, except Mondays take 1.5 tablet    [provider]    Allergies: Sulfa antibiotics, Cefepime , Sulfamethoxazole-trimethoprim, and Zestril  [lisinopril ]    Review of Systems  All other systems reviewed and are negative.   Updated Vital Signs BP (!) 154/79 (BP Location: Right Arm)   Pulse 87   Temp (!) 100.4 F (38 C) (Oral)   Resp 17   Ht 6' (1.829 m)   Wt 77.1 kg   SpO2 98%   BMI 23.06 kg/m   Physical Exam Constitutional:      General: He is not in acute distress.    Appearance: He is well-developed.  HENT:     Head: Atraumatic.  Eyes:     Conjunctiva/sclera: Conjunctivae normal.  Cardiovascular:     Rate and Rhythm: Rhythm irregular.     Pulses: Normal pulses.     Heart sounds: Normal heart sounds.  Pulmonary:  Breath sounds: No wheezing, rhonchi or rales.  Abdominal:     General: Bowel sounds are normal.     Palpations: Abdomen is soft.     Tenderness: There is no abdominal tenderness.  Musculoskeletal:     Cervical back: Normal range of motion and neck supple.  Skin:    Findings: No rash.  Neurological:     Mental Status: He is alert. Mental status is at baseline.     (all labs ordered are listed, but only abnormal results are displayed) Labs Reviewed  CBC WITH DIFFERENTIAL/PLATELET - Abnormal; Notable for the following components:      Result Value   RBC 3.76 (*)    Hemoglobin 11.3 (*)    HCT 36.6 (*)    Platelets 100 (*)    Lymphs Abs  0.3 (*)    All other components within normal limits  COMPREHENSIVE METABOLIC PANEL WITH GFR - Abnormal; Notable for the following components:   CO2 20 (*)    Glucose, Bld 139 (*)    All other components within normal limits  URINALYSIS, ROUTINE W REFLEX MICROSCOPIC - Abnormal; Notable for the following components:   APPearance CLOUDY (*)    Nitrite POSITIVE (*)    Leukocytes,Ua LARGE (*)    Bacteria, UA RARE (*)    All other components within normal limits  PROTIME-INR - Abnormal; Notable for the following components:   Prothrombin Time 30.3 (*)    INR 2.7 (*)    All other components within normal limits  RESP PANEL BY RT-PCR (RSV, FLU A&B, COVID)  RVPGX2  LIPASE, BLOOD  I-STAT CG4 LACTIC ACID, ED  I-STAT CG4 LACTIC ACID, ED    EKG: None  Radiology: DG Abd Portable 1V-Small Bowel Protocol-Position Verification Result Date: 09/29/2023 CLINICAL DATA:  NG tube placement EXAM: PORTABLE ABDOMEN - 1 VIEW COMPARISON:  02/04/2023 FINDINGS: NG tube tip is in the fundus of the stomach with the side port near the GE junction. IMPRESSION: NG tube in the stomach. Electronically Signed   By: Franky Crease M.D.   On: 09/29/2023 18:15   DG Chest Portable 1 View Result Date: 09/29/2023 CLINICAL DATA:  Fever, nausea, and vomiting. EXAM: PORTABLE CHEST 1 VIEW COMPARISON:  03/09/2023. FINDINGS: The heart is enlarged and the mediastinal contour stable. A left atrial appendage clip is noted. There is atherosclerotic calcification of the aorta. No consolidation, effusion, or pneumothorax is seen. Sternotomy wires are present over the midline. No acute osseous abnormality. IMPRESSION: No active disease. Electronically Signed   By: Leita Birmingham M.D.   On: 09/29/2023 17:39   CT ABDOMEN PELVIS W CONTRAST Result Date: 09/29/2023 CLINICAL DATA:  Abdominal pain, acute, nonlocalized. Nausea, vomiting. EXAM: CT ABDOMEN AND PELVIS WITH CONTRAST TECHNIQUE: Multidetector CT imaging of the abdomen and pelvis was  performed using the standard protocol following bolus administration of intravenous contrast. RADIATION DOSE REDUCTION: This exam was performed according to the departmental dose-optimization program which includes automated exposure control, adjustment of the mA and/or kV according to patient size and/or use of iterative reconstruction technique. CONTRAST:  OMNIPAQUE  IOHEXOL  300 MG/ML  SOLN COMPARISON:  None Available. FINDINGS: Lower chest: Cardiomegaly.  No acute findings. Hepatobiliary: No focal liver abnormality is seen. Status post cholecystectomy. No biliary dilatation. Pancreas: No focal abnormality or ductal dilatation. Spleen: No focal abnormality.  Normal size. Adrenals/Urinary Tract: No suspicious renal or adrenal abnormality. No stones or hydronephrosis. Urinary bladder unremarkable. Stomach/Bowel: Normal appendix. Left colonic diverticulosis. No active diverticulitis. Mildly prominent upper abdominal  small bowel loops. No well-defined transition. Mid and distal small bowel loops are decompressed. This could reflect mild focal ileus or early low grade small bowel obstruction. Stomach decompressed. Vascular/Lymphatic: Aortic atherosclerosis. No evidence of aneurysm or adenopathy. Reproductive: Prostate enlargement. Other: No free fluid or free air. Musculoskeletal: No acute bony abnormality. Prior left hip replacement. IMPRESSION: Mildly prominent upper abdominal small bowel loops could reflect mild focal ileus or early low grade small bowel obstruction. Aortic atherosclerosis. Left colonic diverticulosis. Electronically Signed   By: Franky Crease M.D.   On: 09/29/2023 17:20     Procedures   Medications Ordered in the ED  acetaminophen  (TYLENOL ) tablet 1,000 mg (has no administration in time range)  diatrizoate  meglumine -sodium (GASTROGRAFIN ) 66-10 % solution 90 mL (has no administration in time range)  sodium chloride  0.9 % bolus 1,000 mL (1,000 mLs Intravenous New Bag/Given 09/29/23 1615)   ondansetron  (ZOFRAN ) injection 4 mg (4 mg Intravenous Given 09/29/23 1615)  iohexol  (OMNIPAQUE ) 300 MG/ML solution 100 mL (100 mLs Intravenous Contrast Given 09/29/23 1657)                                    Medical Decision Making Amount and/or Complexity of Data Reviewed Labs: ordered. Radiology: ordered.  Risk OTC drugs. Prescription drug management.   BP (!) 154/79 (BP Location: Right Arm)   Pulse 87   Temp (!) 100.4 F (38 C) (Oral)   Resp 17   Ht 6' (1.829 m)   Wt 77.1 kg   SpO2 98%   BMI 23.06 kg/m   70:31 PM  88 year old male with multiple comorbidity which includes history of SBO, CAD, diabetes, prior stroke, A-fib on Coumadin , has mitral valve repair, aortic valve replacement was brought here via EMS from home with complaints of nausea vomiting.  Patient reported this morning he ate some oatmeal for breakfast but shortly after that he felt nauseous, has had several episodes of bloody nonbilious vomiting as well as having some loose stools.  He also endorsed having some chills without any fever.  He does not endorse any runny nose sneezing or coughing no sore throat no chest pain or shortness of breath no abdominal pain no urinary discomfort.  Denies any recent sick contact but states that his son did brought his grandson over yesterday but they do not have any symptoms.  No recent medication changes. Does have hx of SBO  On exam patient is holding emesis bag however he appears to be in no acute discomfort.  Heart with a regular rate and rhythm, lungs are clear, abdomen soft nontender, bowel sounds present.  Skin is warm to the touch.  Vitals are notable for an oral temperature of 100.4.  -Labs ordered, independently viewed and interpreted by me.  Labs remarkable for negative respiratory panel.  Normal lactic acid.  INR therapeutic at 2.7, normal WBC, electrolyte panels are reassuring, -The patient was maintained on a cardiac monitor.  I personally viewed and interpreted  the cardiac monitored which showed an underlying rhythm of: A-fib -Imaging independently viewed and interpreted by me and I agree with radiologist's interpretation.  Result remarkable for abdominal pelvis CT scan demonstrate mild focal ileus or early low-grade SBO.  Will reach out to general surgery and will consult medicine for admission. -This patient presents to the ED for concern of nausea and vomiting, this involves an extensive number of treatment options, and is a complaint that carries with  it a high risk of complications and morbidity.  The differential diagnosis includes SBO, colitis, diverticulitis, appendicitis, pancreatitis, viral GI, viral illness, pneumonia, UTI -Co morbidities that complicate the patient evaluation includes history of SBO, CAD, diabetes -Treatment includes NG tube, Zofran  -Reevaluation of the patient after these medicines showed that the patient improved -PCP office notes or outside notes reviewed -Discussion with specialist general surgeon Dr. Aron who agrees with hospital admission and NG tube.  Appreciate consultation from [facility] DR. Seena who agrees to admit pt.  NG tube have been inserted -Escalation to admission/observation considered: patient is agreeable with admission.       Final diagnoses:  Small bowel obstruction (HCC)  Nausea vomiting and diarrhea    ED Discharge Orders     None          Nivia Colon, PA-C 09/29/23 1825    Doretha Folks, MD 09/29/23 1944

## 2023-09-29 NOTE — ED Notes (Signed)
Pt attempted to urinate but unsuccessful.

## 2023-09-29 NOTE — H&P (Addendum)
 History and Physical   [name]. FMW:[id number] DOB: 01/29/1935 DOA: 09/29/2023  PCP: Katina Pfeiffer, PA-C   Patient coming from: Home  Chief Complaint: Nausea, vomiting  HPI: [name]. is a 88 y.o. male with medical history significant of hypertension, hyperlipidemia, CAD, atrial fibrillation status post M ACE, CVA, stenosis status post AVR, mitral valve repair, tricuspid repair, chronic diastolic CHF, neuropathy, BPH, gout, anemia, SBO presenting with nausea and vomiting.  Patient reports onset of nausea following breakfast this morning.  This was followed by several episodes of nonbilious and nonbloody vomiting.  No known sick contacts.  Patient reports some chills but no fever.  Also reports some loose stool.  Denies chest pain, shortness of breath.  ED Course: Vital signs in ED notable for low-grade fever at 100.4.  Blood pressure in the 150s systolic.  No Lab workup included CMP with bicarb 20, glucose 139.  CBC with hemoglobin stable at 11.3, platelets 100.  PT 30.3, INR 2.7.  Lactic acid normal with repeat pending.  Lipase normal.  Respiratory panel for flu COVID and RSV negative.  Urinalysis pending.  Chest x-ray showed no acute abnormality.  CT of the abdomen pelvis showed mildly prominent upper abdominal loops of small bowel which could reflect ileus versus low-grade SBO.  Patient received Tylenol , Zofran , 1 L IV fluids.  General surgery was consulted who recommended observation with NG tube placement.  Review of Systems: As per HPI otherwise all other systems reviewed and are negative.  Past Medical History:  Diagnosis Date   Acquired dilation of ascending aorta and aortic root (HCC)    43mm aortic root and 41mm ascending aorta on echo 03/2020   Acute respiratory failure with hypoxia (HCC) 02/02/2023   Anemia    Arthritis    Aspiration pneumonia (HCC) 02/04/2023   Bacteremia 03/12/2023   Bladder stones    Borderline diabetes    BPH (benign  prostatic hyperplasia)    Bradycardia 05/01/2023   Monitor 04/2023: permanent atrial fibrillation, periods of slow VR (3rd degree HB) mostly during sleep - longest 1'25; brief tachycardia (?AF w aberrancy)     CAD (coronary artery disease) 12/19/2020   Non-obstructive coronary artery disease at cath in 2019   Complication of anesthesia    problem with voiding after anesthesia,   Coronary artery disease    cardiologist-  dr lovette mar gerhart NP--- per cath 06-02-2010 non-obstructive cad pLAD 30-40%   COVID-19 virus infection 11/11/2021   CVA (cerebral vascular accident) (HCC) 10/24/2016   Diverticulosis of colon    Dysrhythmia    afib   Gout    Heart murmur    History of adenomatous polyp of colon    2002-- tubular adenoma   History of aortic insufficiency    severe -- s/p  AVR 08-03-2010   History of small bowel obstruction    02/ 2007 mechanical sbo s/p  surgical intervention;  partial sbo 09/ 2011 and 03-20-2011 resolved without surgical intervention   History of urinary retention    HTN (hypertension)    Hyperglycemia    Lower extremity edema    Other secondary pulmonary hypertension (HCC) 03/20/2022   TTE 03/19/22: EF 65-70, no RWMA, moderate LVH, normal RVSF, severe pulmonary hypertension (RVSP 60.4), severe BAE, normal structure and function of mitral valve repair (mean gradient 9), mild TR, trivial AI, normal structure and function of AVR (mean 15.4), moderate PI, aortic root and ascending aorta 40 mm, RAP 15   Peripheral neuropathy  Persistent atrial fibrillation (HCC)    Pre-diabetes    S/P aortic valve replacement with prosthetic valve 08/03/2010   tissue valve   S/P Maze operation for atrial fibrillation 01/30/2018   Complete bilateral atrial lesion set using bipolar radiofrequency and cryothermy with clipping of LA appendage   S/P MVR (mitral valve repair) 01/30/2018   Complex valvuloplasty including artificial Gore-tex neochord placement x4 and Carbo medics  Annuloflex ring annuloplasty, size 28   S/P patent foramen ovale closure 08/03/2010   at same time AVR   S/P tricuspid valve repair 01/30/2018   Using an MC3 Annuloplasty ring, size 28   Sepsis (HCC) 03/11/2023   Sepsis due to Enterobacter (HCC) 02/06/2023   SIRS (systemic inflammatory response syndrome) (HCC) 05/21/2021   Stroke (HCC)    Thrombocytopenia (HCC)    Tricuspid regurgitation    Volume overload 02/02/2023    Past Surgical History:  Procedure Laterality Date   BIOPSY  09/07/2018   Procedure: BIOPSY;  Surgeon: Elicia Claw, MD;  Location: [facility];  Service: Gastroenterology;;   CARDIAC CATHETERIZATION  06-02-2010  dr jeffrie   non-obstructive cad- pLAD 30-40%/  normal LVSF/  severe AI   CARDIOVASCULAR STRESS TEST  04/12/2016   Low risk nuclear perfusion study w/ no significant reversible ischemia/  normal LV function and wall motion ,  stress ef 60%/  2mm inferior and lateral scooped ST-segment depression w/ exercise (may be repolarization abnormality), exercise capacity was moderately reduced   CATARACT EXTRACTION W/ INTRAOCULAR LENS  IMPLANT, BILATERAL  02/2010   CLIPPING OF ATRIAL APPENDAGE N/A 01/30/2018   Procedure: CLIPPING OF LEFT ATRIAL APPENDAGE USING ATRICLIP PRO2 ;  Surgeon: Dusty Sudie DEL, MD;  Location: [facility] OR;  Service: Open Heart Surgery;  Laterality: N/A;   COLONOSCOPY WITH PROPOFOL  N/A 10/21/2018   Procedure: COLONOSCOPY WITH PROPOFOL ;  Surgeon: Elicia Claw, MD;  Location: [facility];  Service: Gastroenterology;  Laterality: N/A;   CYSTOSCOPY W/ URETERAL STENT PLACEMENT Right 02/07/2023   Procedure: CYSTOSCOPY WITH RETROGRADE PYELOGRAM/URETERAL STENT PLACEMENT;  Surgeon: Cam Morene ORN, MD;  Location: [facility];  Service: Urology;  Laterality: Right;   CYSTOSCOPY WITH LITHOLAPAXY N/A 06/05/2016   Procedure: CYSTOSCOPY WITH LITHOLAPAXY and fulgarization of bladder neck;  Surgeon: Watt Rush, MD;  Location: [facility];   Service: Urology;  Laterality: N/A;   CYSTOSCOPY/URETEROSCOPY/HOLMIUM LASER/STENT PLACEMENT Right 03/12/2023   Procedure: CYSTOSCOPY/RIGHT URETEROSCOPY/HOLMIUM LASER/STENT PLACEMENT AND RETROGRADE PYELOGRAM;  Surgeon: Watt Rush, MD;  Location: [facility];  Service: Urology;  Laterality: Right;  60  MINUTE CASE   ESOPHAGOGASTRODUODENOSCOPY (EGD) WITH PROPOFOL  N/A 09/07/2018   Procedure: ESOPHAGOGASTRODUODENOSCOPY (EGD) WITH PROPOFOL ;  Surgeon: Elicia Claw, MD;  Location: [facility];  Service: Gastroenterology;  Laterality: N/A;   EXPLORATORY LAPARTOMY /  CHOLECYSTECTOMY  02/28/2005   for Small  bowel obstruction (mechnical)   IR ANGIO EXTRACRAN SEL COM CAROTID INNOMINATE UNI L MOD SED  10/24/2016   IR ANGIO VERTEBRAL SEL SUBCLAVIAN INNOMINATE BILAT MOD SED  10/24/2016   IR PERCUTANEOUS ART THROMBECTOMY/INFUSION INTRACRANIAL INC DIAG ANGIO  10/24/2016   IR RADIOLOGIST EVAL & MGMT  12/05/2016   LEFT KNEE ARTHROSCOPY  2006   MAZE N/A 01/30/2018   Procedure: MAZE;  Surgeon: Dusty Sudie DEL, MD;  Location: [facility] OR;  Service: Open Heart Surgery;  Laterality: N/A;   MITRAL VALVE REPAIR N/A 01/30/2018   Procedure: MITRAL VALVE REPAIR (MVR) USING CARBOMEDICS ANNULOFLEX SIZE 28;  Surgeon: Dusty Sudie DEL, MD;  Location: [facility] OR;  Service: Open Heart Surgery;  Laterality: N/A;   POLYPECTOMY  10/21/2018   Procedure: POLYPECTOMY;  Surgeon: Elicia Claw, MD;  Location: [facility];  Service: Gastroenterology;;   RADIOLOGY WITH ANESTHESIA N/A 10/24/2016   Procedure: RADIOLOGY WITH ANESTHESIA;  Surgeon: Dolphus Carrion, MD;  Location: [facility] OR;  Service: Radiology;  Laterality: N/A;   RIGHT FOOT SURGERY     RIGHT MINIATURE ANTERIOR THORACOTOMY FOR AORTIC VALVE REPLACEMENT AND CLOSURE PATENT FORAMEN OVALE  08-03-2010  DR DUSTY Bohr Magna-ease pericardial tissue valve (25mm)   RIGHT/LEFT HEART CATH AND CORONARY ANGIOGRAPHY N/A 10/14/2017   Procedure: RIGHT/LEFT HEART CATH AND CORONARY ANGIOGRAPHY;   Surgeon: Wonda Sharper, MD;  Location: [facility] INVASIVE CV LAB;  Service: Cardiovascular;  Laterality: N/A;   TEE WITHOUT CARDIOVERSION N/A 10/14/2017   Procedure: TRANSESOPHAGEAL ECHOCARDIOGRAM (TEE);  Surgeon: Delford Maude BROCKS, MD;  Location: [facility] ENDOSCOPY;  Service: Cardiovascular;  Laterality: N/A;   TOTAL HIP ARTHROPLASTY Left 08/02/2020   Procedure: TOTAL HIP ARTHROPLASTY ANTERIOR APPROACH;  Surgeon: Ernie Cough, MD;  Location: [facility];  Service: Orthopedics;  Laterality: Left;   TOTAL KNEE ARTHROPLASTY Left 09/19/2021   Procedure: TOTAL KNEE ARTHROPLASTY;  Surgeon: Ernie Cough, MD;  Location: [facility];  Service: Orthopedics;  Laterality: Left;   TOTAL KNEE ARTHROPLASTY Right 08/09/2022   Procedure: TOTAL KNEE ARTHROPLASTY;  Surgeon: Ernie Cough, MD;  Location: [facility];  Service: Orthopedics;  Laterality: Right;  90   TRANSTHORACIC ECHOCARDIOGRAM  05/30/2016  dr skains   moderate  LVH ef 60-65%/  bioprothesis aortic valve present ,normal grandient and no AI /  mild MV calcification , moderate MR /  mild PR/ moderate TR/  PASP 20mmHg/ (RA denisty was identified 04-27-2016 echo) and is seen again today, this is likely a promient eustacian ridge, atrium is normal size   TRICUSPID VALVE REPLACEMENT N/A 01/30/2018   Procedure: TRICUSPID VALVE REPAIR USING MC3 SIZE 28;  Surgeon: DUSTY Sudie DEL, MD;  Location: [facility] OR;  Service: Open Heart Surgery;  Laterality: N/A;    Social History  reports that he has never smoked. He has never used smokeless tobacco. He reports current alcohol  use. He reports that he does not use drugs.  Allergies  Allergen Reactions   Sulfa Antibiotics Other (See Comments)    Granulocytosis   Cefepime  Other (See Comments)    encephalopathy   Sulfamethoxazole-Trimethoprim     Listed on MAR   Zestril  [Lisinopril ] Cough    Family History  Problem Relation Age of Onset   Heart disease Mother    Brain cancer Father   Reviewed on admission  Prior to Admission  medications   Medication Sig Start Date End Date Taking? Authorizing Provider  amLODipine  (NORVASC ) 5 MG tablet Take 1.5 tablets (7.5 mg total) by mouth daily. 05/22/23   Cindie Ole DASEN, MD  cholecalciferol  (VITAMIN D3) 25 MCG (1000 UNIT) tablet Take 1,000 Units by mouth every evening.    [provider]  Cinnamon 500 MG capsule Take 500 mg by mouth daily.    [provider]  finasteride  (PROSCAR ) 5 MG tablet Take 1 tablet (5 mg total) by mouth daily. 11/05/16   Angiulli, Toribio PARAS, PA-C  losartan  (COZAAR ) 100 MG tablet Take 1 tablet (100 mg total) by mouth daily. 05/21/23   Jeffrie Oneil BROCKS, MD  magnesium  hydroxide (MILK OF MAGNESIA) 400 MG/5ML suspension Take 30 mLs by mouth daily as needed for mild constipation.    [provider]  Multiple Vitamins-Minerals (CENTRUM SILVER ADULT 50+) TABS Take 1 tablet by mouth  daily.    [provider]  rosuvastatin  (CRESTOR ) 10 MG tablet TAKE 1 TABLET BY MOUTH DAILY 05/30/23   Cindie Ole DASEN, MD  tamsulosin  (FLOMAX ) 0.4 MG CAPS capsule Take 0.4 mg by mouth at bedtime. 08/14/23   [provider]  traZODone  (DESYREL ) 50 MG tablet Take 25 mg by mouth at bedtime as needed for sleep.    [provider]  warfarin (COUMADIN ) 5 MG tablet Take 1 Tablet by mouth daily, except Mondays take 1.5 tablet    [provider]    Physical Exam: Vitals:   09/29/23 1438 09/29/23 1440 09/29/23 1509 09/29/23 1815  BP:    (!) 150/75  Pulse:    84  Resp:    20  Temp:      TempSrc:      SpO2: 98%  98% 94%  Weight:  77.1 kg    Height:  6' (1.829 m)      Physical Exam Constitutional:      General: He is not in acute distress.    Appearance: Normal appearance.  HENT:     Head: Normocephalic and atraumatic.     Mouth/Throat:     Mouth: Mucous membranes are moist.     Pharynx: Oropharynx is clear.  Eyes:     Extraocular Movements: Extraocular movements intact.     Pupils: Pupils are equal, round, and  reactive to light.  Cardiovascular:     Rate and Rhythm: Normal rate and regular rhythm.     Pulses: Normal pulses.     Heart sounds: Normal heart sounds.  Pulmonary:     Effort: Pulmonary effort is normal. No respiratory distress.     Breath sounds: Normal breath sounds.  Abdominal:     General: Bowel sounds are normal. There is no distension.     Palpations: Abdomen is soft.     Tenderness: There is no abdominal tenderness.  Musculoskeletal:        General: No swelling or deformity.  Skin:    General: Skin is warm and dry.  Neurological:     General: No focal deficit present.     Mental Status: Mental status is at baseline.    Labs on Admission: I have personally reviewed following labs and imaging studies  CBC: Recent Labs  Lab 09/29/23 1551  WBC 5.9  NEUTROABS 5.1  HGB 11.3*  HCT 36.6*  MCV 97.3  PLT 100*    Basic Metabolic Panel: Recent Labs  Lab 09/29/23 1551  NA 144  K 3.8  CL 109  CO2 20*  GLUCOSE 139*  BUN 21  CREATININE 1.02  CALCIUM  9.3    GFR: Estimated Creatinine Clearance: 56.7 mL/min (by C-G formula based on SCr of 1.02 mg/dL).  Liver Function Tests: Recent Labs  Lab 09/29/23 1551  AST 24  ALT 20  ALKPHOS 72  BILITOT 0.9  PROT 7.4  ALBUMIN  4.3    Urine analysis:    Component Value Date/Time   COLORURINE YELLOW 09/29/2023 1745   APPEARANCEUR CLOUDY (A) 09/29/2023 1745   LABSPEC 1.028 09/29/2023 1745   PHURINE 5.0 09/29/2023 1745   GLUCOSEU NEGATIVE 09/29/2023 1745   HGBUR NEGATIVE 09/29/2023 1745   BILIRUBINUR NEGATIVE 09/29/2023 1745   KETONESUR NEGATIVE 09/29/2023 1745   PROTEINUR NEGATIVE 09/29/2023 1745   UROBILINOGEN 0.2 08/01/2010 0924   NITRITE POSITIVE (A) 09/29/2023 1745   LEUKOCYTESUR LARGE (A) 09/29/2023 1745    Radiological Exams on Admission: DG Abd Portable 1V-Small Bowel Protocol-Position Verification Result Date: 09/29/2023  CLINICAL DATA:  NG tube placement EXAM: PORTABLE ABDOMEN - 1 VIEW COMPARISON:   02/04/2023 FINDINGS: NG tube tip is in the fundus of the stomach with the side port near the GE junction. IMPRESSION: NG tube in the stomach. Electronically Signed   By: Franky Crease M.D.   On: 09/29/2023 18:15   DG Chest Portable 1 View Result Date: 09/29/2023 CLINICAL DATA:  Fever, nausea, and vomiting. EXAM: PORTABLE CHEST 1 VIEW COMPARISON:  03/09/2023. FINDINGS: The heart is enlarged and the mediastinal contour stable. A left atrial appendage clip is noted. There is atherosclerotic calcification of the aorta. No consolidation, effusion, or pneumothorax is seen. Sternotomy wires are present over the midline. No acute osseous abnormality. IMPRESSION: No active disease. Electronically Signed   By: Leita Birmingham M.D.   On: 09/29/2023 17:39   CT ABDOMEN PELVIS W CONTRAST Result Date: 09/29/2023 CLINICAL DATA:  Abdominal pain, acute, nonlocalized. Nausea, vomiting. EXAM: CT ABDOMEN AND PELVIS WITH CONTRAST TECHNIQUE: Multidetector CT imaging of the abdomen and pelvis was performed using the standard protocol following bolus administration of intravenous contrast. RADIATION DOSE REDUCTION: This exam was performed according to the departmental dose-optimization program which includes automated exposure control, adjustment of the mA and/or kV according to patient size and/or use of iterative reconstruction technique. CONTRAST:  OMNIPAQUE  IOHEXOL  300 MG/ML  SOLN COMPARISON:  None Available. FINDINGS: Lower chest: Cardiomegaly.  No acute findings. Hepatobiliary: No focal liver abnormality is seen. Status post cholecystectomy. No biliary dilatation. Pancreas: No focal abnormality or ductal dilatation. Spleen: No focal abnormality.  Normal size. Adrenals/Urinary Tract: No suspicious renal or adrenal abnormality. No stones or hydronephrosis. Urinary bladder unremarkable. Stomach/Bowel: Normal appendix. Left colonic diverticulosis. No active diverticulitis. Mildly prominent upper abdominal small bowel loops. No  well-defined transition. Mid and distal small bowel loops are decompressed. This could reflect mild focal ileus or early low grade small bowel obstruction. Stomach decompressed. Vascular/Lymphatic: Aortic atherosclerosis. No evidence of aneurysm or adenopathy. Reproductive: Prostate enlargement. Other: No free fluid or free air. Musculoskeletal: No acute bony abnormality. Prior left hip replacement. IMPRESSION: Mildly prominent upper abdominal small bowel loops could reflect mild focal ileus or early low grade small bowel obstruction. Aortic atherosclerosis. Left colonic diverticulosis. Electronically Signed   By: Franky Crease M.D.   On: 09/29/2023 17:20   EKG: Not performed in emergency department  Assessment/Plan Principal Problem:   SBO (small bowel obstruction) (HCC) Active Problems:   Cerebrovascular accident (CVA) (HCC)   Essential hypertension   Permanent atrial fibrillation (HCC)   CAD (coronary artery disease)   s/p mitral valve repair   Benign prostatic hyperplasia with lower urinary tract symptoms   S/P AVR (aortic valve replacement)   S/P tricuspid valve repair   S/P Maze operation for atrial fibrillation   Chronic congestive heart failure (HCC)   Peripheral neuropathy   Valvular heart disease   Anemia of chronic disease   HLD (hyperlipidemia)   (HFpEF) heart failure with preserved ejection fraction (HCC)   SBO > Patient presenting with nausea and vomiting since breakfast.  Also some loose stools.   > CT with evidence of possible ileus versus low-grade SBO.  History of prior SBO. > General Surgery consulted recommended observation and NG tube placement. - Appreciate general surgery recommendations and assistance - Continue with NG tube - Small bowel protocol - Supportive care - N.p.o. for now  Fever > Noted to have incidental fever of 100.4.  He reports some chills at home without fever.  No known sick  contacts. > No leukocytosis in the ED.  Chest x-ray clear.   Urinalysis is pending.  Negative for flu COVID RSV.  Will check full viral panel.  No antibiotics for now. - Monitoring on telemetry for now - Trend fever curve and WBC - Follow-up urinalysis - Check full viral panel  Hypertension Hyperlipidemia CAD CVA Neuropathy BPH Gout - Holding p.o. meds for now with NG tube in place  Chronic diastolic CHF Valvular disease > History of aortic valve replacement, mitral valve repair, tricuspid valve repair. > Last echo was in March of this year with EF 70-75%, indeterminate diastolic function, normal RV function.  Mild to moderate mitral stenosis.  Atrial fibrillation > Status post MAZE.  Currently on warfarin. - Holding warfarin for now, patient is n.p.o. and there is some chance he may require surgery though currently this appears unlikely.  DVT prophylaxis: SCDs.  Remains therapeutic with INR and platelets are 100. Code Status:   DNR/DNI Family Communication:  None on admission Disposition Plan:   Patient is from:  Home  Anticipated DC to:  Home  Anticipated DC date:  1 to 3 days  Anticipated DC barriers: None  Consults called:  General Surgery Admission status:  Observation, telemetry  Severity of Illness: The appropriate patient status for this patient is OBSERVATION. Observation status is judged to be reasonable and necessary in order to provide the required intensity of service to ensure the patient's safety. The patient's presenting symptoms, physical exam findings, and initial radiographic and laboratory data in the context of their medical condition is felt to place them at decreased risk for further clinical deterioration. Furthermore, it is anticipated that the patient will be medically stable for discharge from the hospital within 2 midnights of admission.    Marsa KATHEE Scurry MD [facility]  How to contact the [facility] Attending or Consulting provider 7A - 7P or covering provider during after hours 7P -7A, for this  patient?   Check the care team in [facility] and look for a) attending/consulting [facility] provider listed and b) the [facility] team listed Log into [email or web address] and use [place]'s universal password to access. If you do not have the password, please contact the hospital operator. Locate the [facility] provider you are looking for under [facility] and page to a number that you can be directly reached. If you still have difficulty reaching the provider, please page the [facility] (Director on Call) for the Hospitalists listed on amion for assistance.  09/29/2023, 6:26 PM

## 2023-09-30 ENCOUNTER — Inpatient Hospital Stay (HOSPITAL_COMMUNITY)

## 2023-09-30 DIAGNOSIS — I2510 Atherosclerotic heart disease of native coronary artery without angina pectoris: Secondary | ICD-10-CM | POA: Diagnosis present

## 2023-09-30 DIAGNOSIS — I2729 Other secondary pulmonary hypertension: Secondary | ICD-10-CM | POA: Diagnosis present

## 2023-09-30 DIAGNOSIS — E876 Hypokalemia: Secondary | ICD-10-CM | POA: Diagnosis present

## 2023-09-30 DIAGNOSIS — I4821 Permanent atrial fibrillation: Secondary | ICD-10-CM | POA: Diagnosis present

## 2023-09-30 DIAGNOSIS — Z953 Presence of xenogenic heart valve: Secondary | ICD-10-CM | POA: Diagnosis not present

## 2023-09-30 DIAGNOSIS — E785 Hyperlipidemia, unspecified: Secondary | ICD-10-CM | POA: Diagnosis present

## 2023-09-30 DIAGNOSIS — K56609 Unspecified intestinal obstruction, unspecified as to partial versus complete obstruction: Secondary | ICD-10-CM

## 2023-09-30 DIAGNOSIS — I110 Hypertensive heart disease with heart failure: Secondary | ICD-10-CM | POA: Diagnosis present

## 2023-09-30 DIAGNOSIS — Z808 Family history of malignant neoplasm of other organs or systems: Secondary | ICD-10-CM | POA: Diagnosis not present

## 2023-09-30 DIAGNOSIS — G629 Polyneuropathy, unspecified: Secondary | ICD-10-CM | POA: Diagnosis present

## 2023-09-30 DIAGNOSIS — Z66 Do not resuscitate: Secondary | ICD-10-CM | POA: Diagnosis present

## 2023-09-30 DIAGNOSIS — K5669 Other partial intestinal obstruction: Secondary | ICD-10-CM | POA: Diagnosis not present

## 2023-09-30 DIAGNOSIS — K5730 Diverticulosis of large intestine without perforation or abscess without bleeding: Secondary | ICD-10-CM | POA: Diagnosis not present

## 2023-09-30 DIAGNOSIS — Z96653 Presence of artificial knee joint, bilateral: Secondary | ICD-10-CM | POA: Diagnosis present

## 2023-09-30 DIAGNOSIS — Z96642 Presence of left artificial hip joint: Secondary | ICD-10-CM | POA: Diagnosis not present

## 2023-09-30 DIAGNOSIS — Z8616 Personal history of COVID-19: Secondary | ICD-10-CM | POA: Diagnosis not present

## 2023-09-30 DIAGNOSIS — Z8673 Personal history of transient ischemic attack (TIA), and cerebral infarction without residual deficits: Secondary | ICD-10-CM | POA: Diagnosis not present

## 2023-09-30 DIAGNOSIS — N401 Enlarged prostate with lower urinary tract symptoms: Secondary | ICD-10-CM | POA: Diagnosis present

## 2023-09-30 DIAGNOSIS — Z7901 Long term (current) use of anticoagulants: Secondary | ICD-10-CM | POA: Diagnosis not present

## 2023-09-30 DIAGNOSIS — I5032 Chronic diastolic (congestive) heart failure: Secondary | ICD-10-CM | POA: Diagnosis present

## 2023-09-30 DIAGNOSIS — D638 Anemia in other chronic diseases classified elsewhere: Secondary | ICD-10-CM | POA: Diagnosis present

## 2023-09-30 DIAGNOSIS — E860 Dehydration: Secondary | ICD-10-CM | POA: Diagnosis present

## 2023-09-30 DIAGNOSIS — Z1152 Encounter for screening for COVID-19: Secondary | ICD-10-CM | POA: Diagnosis not present

## 2023-09-30 DIAGNOSIS — M109 Gout, unspecified: Secondary | ICD-10-CM | POA: Diagnosis present

## 2023-09-30 DIAGNOSIS — E870 Hyperosmolality and hypernatremia: Secondary | ICD-10-CM | POA: Diagnosis present

## 2023-09-30 DIAGNOSIS — R509 Fever, unspecified: Secondary | ICD-10-CM | POA: Diagnosis not present

## 2023-09-30 DIAGNOSIS — Z8249 Family history of ischemic heart disease and other diseases of the circulatory system: Secondary | ICD-10-CM | POA: Diagnosis not present

## 2023-09-30 DIAGNOSIS — Z4682 Encounter for fitting and adjustment of non-vascular catheter: Secondary | ICD-10-CM | POA: Diagnosis not present

## 2023-09-30 LAB — RESPIRATORY PANEL BY PCR

## 2023-09-30 LAB — CBC
HCT: 33.2 % — ABNORMAL LOW (ref 39.0–52.0)
Hemoglobin: 10.4 g/dL — ABNORMAL LOW (ref 13.0–17.0)
MCH: 30.3 pg (ref 26.0–34.0)
MCHC: 31.3 g/dL (ref 30.0–36.0)
MCV: 96.8 fL (ref 80.0–100.0)
Platelets: 94 K/uL — ABNORMAL LOW (ref 150–400)
RBC: 3.43 MIL/uL — ABNORMAL LOW (ref 4.22–5.81)
RDW: 14.1 % (ref 11.5–15.5)
WBC: 4.9 K/uL (ref 4.0–10.5)
nRBC: 0 % (ref 0.0–0.2)

## 2023-09-30 LAB — COMPREHENSIVE METABOLIC PANEL WITH GFR
ALT: 18 U/L (ref 0–44)
AST: 24 U/L (ref 15–41)
Albumin: 4.1 g/dL (ref 3.5–5.0)
Alkaline Phosphatase: 65 U/L (ref 38–126)
Anion gap: 15 (ref 5–15)
BUN: 18 mg/dL (ref 8–23)
CO2: 22 mmol/L (ref 22–32)
Calcium: 9 mg/dL (ref 8.9–10.3)
Chloride: 110 mmol/L (ref 98–111)
Creatinine, Ser: 0.94 mg/dL (ref 0.61–1.24)
GFR, Estimated: >60 mL/min (ref >60)
Glucose, Bld: 121 mg/dL — ABNORMAL HIGH (ref 70–99)
Potassium: 3.4 mmol/L — ABNORMAL LOW (ref 3.5–5.1)
Sodium: 147 mmol/L — ABNORMAL HIGH (ref 135–145)
Total Bilirubin: 1.1 mg/dL (ref 0.0–1.2)
Total Protein: 6.9 g/dL (ref 6.5–8.1)

## 2023-09-30 LAB — PROTIME-INR
INR: 2.7 — ABNORMAL HIGH (ref 0.8–1.2)
Prothrombin Time: 30.3 s — ABNORMAL HIGH (ref 11.4–15.2)

## 2023-09-30 MED ORDER — KCL IN DEXTROSE-NACL 10-5-0.45 MEQ/L-%-% IV SOLN
INTRAVENOUS | Status: DC
  Filled 2023-09-30 (×3): qty 1000

## 2023-09-30 NOTE — Progress Notes (Addendum)
 Mobility Specialist - Progress Note    09/30/23 1344  Mobility  Activity Ambulated with assistance  Level of Assistance Independent after set-up  Assistive Device Front wheel walker  Distance Ambulated (ft) 500 ft  Range of Motion/Exercises Active  Activity Response Tolerated well  Mobility Referral Yes  Mobility visit 1 Mobility  Mobility Specialist Start Time (ACUTE ONLY) 1326  Mobility Specialist Stop Time (ACUTE ONLY) 1337  Mobility Specialist Time Calculation (min) (ACUTE ONLY) 11 min    Pt was received in bed and agreed to mobility. MinA from STS. Returned to bed with all needs met. Call bell in reach.  [employer] - Mobility Specialist

## 2023-09-30 NOTE — Progress Notes (Signed)
   09/30/23 1412  TOC Brief Assessment  Insurance and Status Reviewed  Patient has primary care physician Yes  Home environment has been reviewed resides in private residence  Prior level of function: Independent  Prior/Current Home Services No current home services  Social Drivers of Health Review SDOH reviewed no interventions necessary  Readmission risk has been reviewed Yes  Transition of care needs no transition of care needs at this time

## 2023-09-30 NOTE — Progress Notes (Signed)
 PROGRESS NOTE    [name].  FMW:[id number] DOB: 04-13-1935 DOA: 09/29/2023 PCP: Katina Pfeiffer, PA-C  Outpatient Specialists:     Brief Narrative:  Patient is an 88 year old male past medical history significant for hypertension, hyperlipidemia, CAD, atrial fibrillation status post M ACE, CVA, stenosis status post AVR, mitral valve repair, tricuspid repair, chronic diastolic CHF, neuropathy, BPH, gout, anemia, and small bowel obstruction.  Patient presented with several episodes of nausea and vomiting.  CT of the abdomen and pelvis revealed mildly prominent upper abdominal loops of small bowel, reflective of ileus versus low-grade SBO.  Surgical input is appreciated.  NG tube has been placed.  09/30/2023: Vitals are stable.  BMP reveals sodium of 147 and potassium of 3.4.  CBC revealed WBC of 4.9, hemoglobin of 10.4 and hematocrit of 33.2.  Platelet count is 94.  Will start patient on IV fluid.  Will monitor and correct electrolytes.  Supportive care for now.    Assessment & Plan:   Principal Problem:   SBO (small bowel obstruction) (HCC) Active Problems:   Essential hypertension   S/P AVR (aortic valve replacement)   Benign prostatic hyperplasia with lower urinary tract symptoms   Permanent atrial fibrillation (HCC)   s/p mitral valve repair   S/P tricuspid valve repair   S/P Maze operation for atrial fibrillation   Chronic congestive heart failure (HCC)   Peripheral neuropathy   CAD (coronary artery disease)   Cerebrovascular accident (CVA) (HCC)   Valvular heart disease   Anemia of chronic disease   HLD (hyperlipidemia)   (HFpEF) heart failure with preserved ejection fraction (HCC)   Small bowel obstruction:  -CT with evidence of possible ileus versus low-grade SBO. - History of prior SBO. - General Surgery consulted to assist with patient's management. - NG tube to low intermittent wall suction. - Supportive care for now. - Will start patient on IV  fluids. - Will monitor volume status, renal function and electrolytes. - Will correct abnormal electrolytes.     Fever -Tmax of 100.7. - Monitor closely as patient is at risk for aspiration pneumonia. - Repeat chest x-ray tomorrow. - Follow WBC.  Dehydration/hypernatremia: - Start IV fluid. - Sodium of 147.  Hypokalemia: - Potassium of 3.4 - Monitor and replete.   Hypertension Hyperlipidemia CAD CVA Neuropathy BPH Gout - Holding p.o. meds for now with NG tube in place   Chronic diastolic CHF Valvular disease > History of aortic valve replacement, mitral valve repair, tricuspid valve repair. > Last echo was in March of this year with EF 70-75%, indeterminate diastolic function, normal RV function.  Mild to moderate mitral stenosis.   Atrial fibrillation > Status post MAZE.  Currently on warfarin. - Holding warfarin for now, patient is n.p.o. and there is some chance he may require surgery though currently this appears unlikely.   DVT prophylaxis: INR of 2.7 today.  Follow INR. Code Status: DO NOT RESUSCITATE. Family Communication:  Disposition Plan: Inpatient.   Consultants:  General Surgery  Procedures:  NG tube placement  Antimicrobials:  None   Subjective: No new complaints  Objective: Vitals:   09/29/23 2039 09/29/23 2240 09/30/23 0141 09/30/23 0536  BP: (!) 148/73  136/72 131/60  Pulse: 78  71 74  Resp: 18  18 18   Temp: (!) 100.7 F (38.2 C) 99.5 F (37.5 C) 98.9 F (37.2 C) 99.1 F (37.3 C)  TempSrc: Oral Oral Oral Oral  SpO2: 94%  96% 95%  Weight:      Height:  Intake/Output Summary (Last 24 hours) at 09/30/2023 0823 Last data filed at 09/30/2023 0600 Gross per 24 hour  Intake 0 ml  Output 2050 ml  Net -2050 ml   Filed Weights   09/29/23 1440  Weight: 77.1 kg    Examination:  General exam: Appears calm and comfortable.  NG tube to low intermittent wall suction.  Dry buccal mucosa. Respiratory system: Clear to  auscultation. Respiratory effort normal. Cardiovascular system: S1 & S2  Gastrointestinal system: Abdomen is nondistended, soft and nontender.  Central nervous system: Alert and oriented.    Data Reviewed: I have personally reviewed following labs and imaging studies  CBC: Recent Labs  Lab 09/29/23 1551 09/30/23 0439  WBC 5.9 4.9  NEUTROABS 5.1  --   HGB 11.3* 10.4*  HCT 36.6* 33.2*  MCV 97.3 96.8  PLT 100* 94*   Basic Metabolic Panel: Recent Labs  Lab 09/29/23 1551 09/30/23 0439  NA 144 147*  K 3.8 3.4*  CL 109 110  CO2 20* 22  GLUCOSE 139* 121*  BUN 21 18  CREATININE 1.02 0.94  CALCIUM  9.3 9.0   GFR: Estimated Creatinine Clearance: 61.5 mL/min (by C-G formula based on SCr of 0.94 mg/dL). Liver Function Tests: Recent Labs  Lab 09/29/23 1551 09/30/23 0439  AST 24 24  ALT 20 18  ALKPHOS 72 65  BILITOT 0.9 1.1  PROT 7.4 6.9  ALBUMIN  4.3 4.1   Recent Labs  Lab 09/29/23 1551  LIPASE 17   No results for input(s): AMMONIA in the last 168 hours. Coagulation Profile: Recent Labs  Lab 09/29/23 1613 09/30/23 0439  INR 2.7* 2.7*   Cardiac Enzymes: No results for input(s): CKTOTAL, CKMB, CKMBINDEX, TROPONINI in the last 168 hours. BNP (last 3 results) No results for input(s): PROBNP in the last 8760 hours. HbA1C: No results for input(s): HGBA1C in the last 72 hours. CBG: No results for input(s): GLUCAP in the last 168 hours. Lipid Profile: No results for input(s): CHOL, HDL, LDLCALC, TRIG, CHOLHDL, LDLDIRECT in the last 72 hours. Thyroid Function Tests: No results for input(s): TSH, T4TOTAL, FREET4, T3FREE, THYROIDAB in the last 72 hours. Anemia Panel: No results for input(s): VITAMINB12, FOLATE, FERRITIN, TIBC, IRON, RETICCTPCT in the last 72 hours. Urine analysis:    Component Value Date/Time   COLORURINE YELLOW 09/29/2023 1745   APPEARANCEUR CLOUDY (A) 09/29/2023 1745   LABSPEC 1.028 09/29/2023  1745   PHURINE 5.0 09/29/2023 1745   GLUCOSEU NEGATIVE 09/29/2023 1745   HGBUR NEGATIVE 09/29/2023 1745   BILIRUBINUR NEGATIVE 09/29/2023 1745   KETONESUR NEGATIVE 09/29/2023 1745   PROTEINUR NEGATIVE 09/29/2023 1745   UROBILINOGEN 0.2 08/01/2010 0924   NITRITE POSITIVE (A) 09/29/2023 1745   LEUKOCYTESUR LARGE (A) 09/29/2023 1745   Sepsis Labs: @LABRCNTIP (procalcitonin:4,lacticidven:4)  ) Recent Results (from the past 240 hours)  Resp panel by RT-PCR (RSV, Flu A&B, Covid) Anterior Nasal Swab     Status: None   Collection Time: 09/29/23  4:24 PM   Specimen: Anterior Nasal Swab  Result Value Ref Range Status   SARS Coronavirus 2 by RT PCR NEGATIVE NEGATIVE Final    Comment: (NOTE) SARS-CoV-2 target nucleic acids are NOT DETECTED.  The SARS-CoV-2 RNA is generally detectable in upper respiratory specimens during the acute phase of infection. The lowest concentration of SARS-CoV-2 viral copies this assay can detect is 138 copies/mL. A negative result does not preclude SARS-Cov-2 infection and should not be used as the sole basis for treatment or other patient management decisions. A negative result  may occur with  improper specimen collection/handling, submission of specimen other than nasopharyngeal swab, presence of viral mutation(s) within the areas targeted by this assay, and inadequate number of viral copies(<138 copies/mL). A negative result must be combined with clinical observations, patient history, and epidemiological information. The expected result is Negative.  Fact Sheet for Patients:  BloggerCourse.com  Fact Sheet for Healthcare Providers:  SeriousBroker.it  This test is no t yet approved or cleared by the [place]  FDA and  has been authorized for detection and/or diagnosis of SARS-CoV-2 by FDA under an Emergency Use Authorization (EUA). This EUA will remain  in effect (meaning this test can be used) for the  duration of the COVID-19 declaration under Section 564(b)(1) of the Act, 21 U.S.C.section 360bbb-3(b)(1), unless the authorization is terminated  or revoked sooner.       Influenza A by PCR NEGATIVE NEGATIVE Final   Influenza B by PCR NEGATIVE NEGATIVE Final    Comment: (NOTE) The Xpert Xpress SARS-CoV-2/FLU/RSV plus assay is intended as an aid in the diagnosis of influenza from Nasopharyngeal swab specimens and should not be used as a sole basis for treatment. Nasal washings and aspirates are unacceptable for Xpert Xpress SARS-CoV-2/FLU/RSV testing.  Fact Sheet for Patients: BloggerCourse.com  Fact Sheet for Healthcare Providers: SeriousBroker.it  This test is not yet approved or cleared by the [place]  FDA and has been authorized for detection and/or diagnosis of SARS-CoV-2 by FDA under an Emergency Use Authorization (EUA). This EUA will remain in effect (meaning this test can be used) for the duration of the COVID-19 declaration under Section 564(b)(1) of the Act, 21 U.S.C. section 360bbb-3(b)(1), unless the authorization is terminated or revoked.     Resp Syncytial Virus by PCR NEGATIVE NEGATIVE Final    Comment: (NOTE) Fact Sheet for Patients: BloggerCourse.com  Fact Sheet for Healthcare Providers: SeriousBroker.it  This test is not yet approved or cleared by the [place]  FDA and has been authorized for detection and/or diagnosis of SARS-CoV-2 by FDA under an Emergency Use Authorization (EUA). This EUA will remain in effect (meaning this test can be used) for the duration of the COVID-19 declaration under Section 564(b)(1) of the Act, 21 U.S.C. section 360bbb-3(b)(1), unless the authorization is terminated or revoked.  Performed at [facility], [address] [place], [place] [address]   Respiratory (~20 pathogens) panel by PCR      Status: None   Collection Time: 09/30/23 12:36 AM   Specimen: Nasopharyngeal Swab; Respiratory  Result Value Ref Range Status   Adenovirus NOT DETECTED NOT DETECTED Final   Coronavirus 229E NOT DETECTED NOT DETECTED Final    Comment: (NOTE) The Coronavirus on the Respiratory Panel, DOES NOT test for the novel  Coronavirus (2019 nCoV)    Coronavirus HKU1 NOT DETECTED NOT DETECTED Final   Coronavirus NL63 NOT DETECTED NOT DETECTED Final   Coronavirus OC43 NOT DETECTED NOT DETECTED Final   Metapneumovirus NOT DETECTED NOT DETECTED Final   Rhinovirus / Enterovirus NOT DETECTED NOT DETECTED Final   Influenza A NOT DETECTED NOT DETECTED Final   Influenza B NOT DETECTED NOT DETECTED Final   Parainfluenza Virus 1 NOT DETECTED NOT DETECTED Final   Parainfluenza Virus 2 NOT DETECTED NOT DETECTED Final   Parainfluenza Virus 3 NOT DETECTED NOT DETECTED Final   Parainfluenza Virus 4 NOT DETECTED NOT DETECTED Final   Respiratory Syncytial Virus NOT DETECTED NOT DETECTED Final   Bordetella pertussis NOT DETECTED NOT DETECTED Final   Bordetella Parapertussis NOT  DETECTED NOT DETECTED Final   Chlamydophila pneumoniae NOT DETECTED NOT DETECTED Final   Mycoplasma pneumoniae NOT DETECTED NOT DETECTED Final    Comment: Performed at [facility], [address], [place], [place] [address]         Radiology Studies: DG Abd Portable 1V-Small Bowel Protocol-Position Verification Result Date: 09/29/2023 CLINICAL DATA:  NG tube placement EXAM: PORTABLE ABDOMEN - 1 VIEW COMPARISON:  02/04/2023 FINDINGS: NG tube tip is in the fundus of the stomach with the side port near the GE junction. IMPRESSION: NG tube in the stomach. Electronically Signed   By: Franky Crease M.D.   On: 09/29/2023 18:15   DG Chest Portable 1 View Result Date: 09/29/2023 CLINICAL DATA:  Fever, nausea, and vomiting. EXAM: PORTABLE CHEST 1 VIEW COMPARISON:  03/09/2023. FINDINGS: The heart is enlarged and the mediastinal contour  stable. A left atrial appendage clip is noted. There is atherosclerotic calcification of the aorta. No consolidation, effusion, or pneumothorax is seen. Sternotomy wires are present over the midline. No acute osseous abnormality. IMPRESSION: No active disease. Electronically Signed   By: Leita Birmingham M.D.   On: 09/29/2023 17:39   CT ABDOMEN PELVIS W CONTRAST Result Date: 09/29/2023 CLINICAL DATA:  Abdominal pain, acute, nonlocalized. Nausea, vomiting. EXAM: CT ABDOMEN AND PELVIS WITH CONTRAST TECHNIQUE: Multidetector CT imaging of the abdomen and pelvis was performed using the standard protocol following bolus administration of intravenous contrast. RADIATION DOSE REDUCTION: This exam was performed according to the departmental dose-optimization program which includes automated exposure control, adjustment of the mA and/or kV according to patient size and/or use of iterative reconstruction technique. CONTRAST:  OMNIPAQUE  IOHEXOL  300 MG/ML  SOLN COMPARISON:  None Available. FINDINGS: Lower chest: Cardiomegaly.  No acute findings. Hepatobiliary: No focal liver abnormality is seen. Status post cholecystectomy. No biliary dilatation. Pancreas: No focal abnormality or ductal dilatation. Spleen: No focal abnormality.  Normal size. Adrenals/Urinary Tract: No suspicious renal or adrenal abnormality. No stones or hydronephrosis. Urinary bladder unremarkable. Stomach/Bowel: Normal appendix. Left colonic diverticulosis. No active diverticulitis. Mildly prominent upper abdominal small bowel loops. No well-defined transition. Mid and distal small bowel loops are decompressed. This could reflect mild focal ileus or early low grade small bowel obstruction. Stomach decompressed. Vascular/Lymphatic: Aortic atherosclerosis. No evidence of aneurysm or adenopathy. Reproductive: Prostate enlargement. Other: No free fluid or free air. Musculoskeletal: No acute bony abnormality. Prior left hip replacement. IMPRESSION: Mildly  prominent upper abdominal small bowel loops could reflect mild focal ileus or early low grade small bowel obstruction. Aortic atherosclerosis. Left colonic diverticulosis. Electronically Signed   By: Franky Crease M.D.   On: 09/29/2023 17:20        Scheduled Meds:  acetaminophen   1,000 mg Oral Once   diatrizoate  meglumine -sodium  90 mL Per NG tube Once   Influenza vac split trivalent PF  0.5 mL Intramuscular Tomorrow-1000   sodium chloride  flush  3 mL Intravenous Q12H   Continuous Infusions:   LOS: 0 days    Time spent: 55 minutes.    Leatrice Chapel, MD  [facility] Pager #: [phone] 7PM-7AM contact night coverage as above

## 2023-09-30 NOTE — Progress Notes (Signed)
 Mobility Specialist - Progress Note    09/30/23 1000  Mobility  Activity Ambulated with assistance  Level of Assistance Modified independent, requires aide device or extra time  Assistive Device Front wheel walker  Distance Ambulated (ft) 500 ft  Range of Motion/Exercises Active  Activity Response Tolerated well  Mobility Referral Yes  Mobility visit 1 Mobility  Mobility Specialist Start Time (ACUTE ONLY) 0930  Mobility Specialist Stop Time (ACUTE ONLY) 0940  Mobility Specialist Time Calculation (min) (ACUTE ONLY) 10 min    Pt was received in bed and agreed to mobility. Min A for STS. Standby for ambulation. Returned to chair with all needs met. Call bell in reach.   [employer] - Mobility Specialist

## 2023-09-30 NOTE — Plan of Care (Signed)
  Problem: Education: Goal: Knowledge of General Education information will improve Description: Including pain rating scale, medication(s)/side effects and non-pharmacologic comfort measures 09/30/2023 0404 by Vicci Delon PARAS, RN Outcome: Progressing 09/30/2023 0404 by Vicci Delon PARAS, RN Outcome: Progressing   Problem: Health Behavior/Discharge Planning: Goal: Ability to manage health-related needs will improve 09/30/2023 0404 by Vicci Delon PARAS, RN Outcome: Progressing 09/30/2023 0404 by Vicci Delon PARAS, RN Outcome: Progressing   Problem: Clinical Measurements: Goal: Ability to maintain clinical measurements within normal limits will improve 09/30/2023 0404 by Vicci Delon PARAS, RN Outcome: Progressing 09/30/2023 0404 by Vicci Delon PARAS, RN Outcome: Progressing Goal: Will remain free from infection 09/30/2023 0404 by Vicci Delon PARAS, RN Outcome: Progressing 09/30/2023 0404 by Vicci Delon PARAS, RN Outcome: Progressing Goal: Diagnostic test results will improve 09/30/2023 0404 by Vicci Delon PARAS, RN Outcome: Progressing 09/30/2023 0404 by Vicci Delon PARAS, RN Outcome: Progressing Goal: Respiratory complications will improve 09/30/2023 0404 by Vicci Delon PARAS, RN Outcome: Progressing 09/30/2023 0404 by Vicci Delon PARAS, RN Outcome: Progressing Goal: Cardiovascular complication will be avoided 09/30/2023 0404 by Vicci Delon PARAS, RN Outcome: Progressing 09/30/2023 0404 by Vicci Delon PARAS, RN Outcome: Progressing   Problem: Activity: Goal: Risk for activity intolerance will decrease 09/30/2023 0404 by Vicci Delon PARAS, RN Outcome: Progressing 09/30/2023 0404 by Vicci Delon PARAS, RN Outcome: Progressing   Problem: Nutrition: Goal: Adequate nutrition will be maintained 09/30/2023 0404 by Vicci Delon PARAS, RN Outcome: Progressing 09/30/2023 0404 by Vicci Delon PARAS, RN Outcome: Progressing   Problem: Coping: Goal: Level  of anxiety will decrease 09/30/2023 0404 by Vicci Delon PARAS, RN Outcome: Progressing 09/30/2023 0404 by Vicci Delon PARAS, RN Outcome: Progressing   Problem: Elimination: Goal: Will not experience complications related to bowel motility 09/30/2023 0404 by Vicci Delon PARAS, RN Outcome: Progressing 09/30/2023 0404 by Vicci Delon PARAS, RN Outcome: Progressing Goal: Will not experience complications related to urinary retention 09/30/2023 0404 by Vicci Delon PARAS, RN Outcome: Progressing 09/30/2023 0404 by Vicci Delon PARAS, RN Outcome: Progressing   Problem: Pain Managment: Goal: General experience of comfort will improve and/or be controlled 09/30/2023 0404 by Vicci Delon PARAS, RN Outcome: Progressing 09/30/2023 0404 by Vicci Delon PARAS, RN Outcome: Progressing   Problem: Safety: Goal: Ability to remain free from injury will improve 09/30/2023 0404 by Vicci Delon PARAS, RN Outcome: Progressing 09/30/2023 0404 by Vicci Delon PARAS, RN Outcome: Progressing   Problem: Skin Integrity: Goal: Risk for impaired skin integrity will decrease 09/30/2023 0404 by Vicci Delon PARAS, RN Outcome: Progressing 09/30/2023 0404 by Vicci Delon PARAS, RN Outcome: Progressing

## 2023-09-30 NOTE — Consult Note (Signed)
 Gerald PARAS Mance Jr. 1935/01/18  [id number].    Requesting MD: Seena, MD Chief Complaint/Reason for Consult: SBO  HPI:  [name] is an 88 y/o M with PMH hypertension, hyperlipidemia, CAD, atrial fibrillation, valvular heart disease, CVA, CHF, neuropathy, and SBO who presents with less than 24 hours of nausea and vomiting.  He states that yesterday around lunchtime he developed nausea.  He reports a loose, nonbloody stool, followed by multiple episodes of vomiting.  This prompted him to call EMS.  He denies significant abdominal pain, his main complaint is nausea.  He denies sick contacts.  He reports a history of exploratory abdominal surgery and cholecystectomy over 10 years ago as well as a remote history of a bowel obstruction that he says resolved nonoperatively.  He currently takes warfarin.  He reports being independent of ADLs, usually mobilizes without assistive device but occasionally uses a cane, for example going down his steep driveway.  His wife lives at home with him.  ROS: Review of Systems  Constitutional:  Positive for fever and malaise/fatigue.  Gastrointestinal:  Positive for diarrhea, nausea and vomiting. Negative for blood in stool and melena.  All other systems reviewed and are negative.   Family History  Problem Relation Age of Onset   Heart disease Mother    Brain cancer Father     Past Medical History:  Diagnosis Date   Acquired dilation of ascending aorta and aortic root (HCC)    43mm aortic root and 41mm ascending aorta on echo 03/2020   Acute respiratory failure with hypoxia (HCC) 02/02/2023   Anemia    Arthritis    Aspiration pneumonia (HCC) 02/04/2023   Bacteremia 03/12/2023   Bladder stones    Borderline diabetes    BPH (benign prostatic hyperplasia)    Bradycardia 05/01/2023   Monitor 04/2023: permanent atrial fibrillation, periods of slow VR (3rd degree HB) mostly during sleep - longest 1'25; brief tachycardia (?AF w aberrancy)      CAD (coronary artery disease) 12/19/2020   Non-obstructive coronary artery disease at cath in 2019   Complication of anesthesia    problem with voiding after anesthesia,   Coronary artery disease    cardiologist-  dr lovette mar gerhart NP--- per cath 06-02-2010 non-obstructive cad pLAD 30-40%   COVID-19 virus infection 11/11/2021   CVA (cerebral vascular accident) (HCC) 10/24/2016   Diverticulosis of colon    Dysrhythmia    afib   Gout    Heart murmur    History of adenomatous polyp of colon    2002-- tubular adenoma   History of aortic insufficiency    severe -- s/p  AVR 08-03-2010   History of small bowel obstruction    02/ 2007 mechanical sbo s/p  surgical intervention;  partial sbo 09/ 2011 and 03-20-2011 resolved without surgical intervention   History of urinary retention    HTN (hypertension)    Hyperglycemia    Lower extremity edema    Other secondary pulmonary hypertension (HCC) 03/20/2022   TTE 03/19/22: EF 65-70, no RWMA, moderate LVH, normal RVSF, severe pulmonary hypertension (RVSP 60.4), severe BAE, normal structure and function of mitral valve repair (mean gradient 9), mild TR, trivial AI, normal structure and function of AVR (mean 15.4), moderate PI, aortic root and ascending aorta 40 mm, RAP 15   Peripheral neuropathy    Persistent atrial fibrillation (HCC)    Pre-diabetes    S/P aortic valve replacement with prosthetic valve 08/03/2010   tissue valve  S/P Maze operation for atrial fibrillation 01/30/2018   Complete bilateral atrial lesion set using bipolar radiofrequency and cryothermy with clipping of LA appendage   S/P MVR (mitral valve repair) 01/30/2018   Complex valvuloplasty including artificial Gore-tex neochord placement x4 and Carbo medics Annuloflex ring annuloplasty, size 28   S/P patent foramen ovale closure 08/03/2010   at same time AVR   S/P tricuspid valve repair 01/30/2018   Using an MC3 Annuloplasty ring, size 28   Sepsis (HCC) 03/11/2023    Sepsis due to Enterobacter (HCC) 02/06/2023   SIRS (systemic inflammatory response syndrome) (HCC) 05/21/2021   Stroke (HCC)    Thrombocytopenia (HCC)    Tricuspid regurgitation    Volume overload 02/02/2023    Past Surgical History:  Procedure Laterality Date   BIOPSY  09/07/2018   Procedure: BIOPSY;  Surgeon: Elicia Claw, MD;  Location: [facility];  Service: Gastroenterology;;   CARDIAC CATHETERIZATION  06-02-2010  dr jeffrie   non-obstructive cad- pLAD 30-40%/  normal LVSF/  severe AI   CARDIOVASCULAR STRESS TEST  04/12/2016   Low risk nuclear perfusion study w/ no significant reversible ischemia/  normal LV function and wall motion ,  stress ef 60%/  2mm inferior and lateral scooped ST-segment depression w/ exercise (may be repolarization abnormality), exercise capacity was moderately reduced   CATARACT EXTRACTION W/ INTRAOCULAR LENS  IMPLANT, BILATERAL  02/2010   CLIPPING OF ATRIAL APPENDAGE N/A 01/30/2018   Procedure: CLIPPING OF LEFT ATRIAL APPENDAGE USING ATRICLIP PRO2 ;  Surgeon: Dusty Sudie DEL, MD;  Location: [facility] OR;  Service: Open Heart Surgery;  Laterality: N/A;   COLONOSCOPY WITH PROPOFOL  N/A 10/21/2018   Procedure: COLONOSCOPY WITH PROPOFOL ;  Surgeon: Elicia Claw, MD;  Location: [facility];  Service: Gastroenterology;  Laterality: N/A;   CYSTOSCOPY W/ URETERAL STENT PLACEMENT Right 02/07/2023   Procedure: CYSTOSCOPY WITH RETROGRADE PYELOGRAM/URETERAL STENT PLACEMENT;  Surgeon: Cam Morene ORN, MD;  Location: [facility];  Service: Urology;  Laterality: Right;   CYSTOSCOPY WITH LITHOLAPAXY N/A 06/05/2016   Procedure: CYSTOSCOPY WITH LITHOLAPAXY and fulgarization of bladder neck;  Surgeon: Watt Rush, MD;  Location: [facility];  Service: Urology;  Laterality: N/A;   CYSTOSCOPY/URETEROSCOPY/HOLMIUM LASER/STENT PLACEMENT Right 03/12/2023   Procedure: CYSTOSCOPY/RIGHT URETEROSCOPY/HOLMIUM LASER/STENT PLACEMENT AND RETROGRADE PYELOGRAM;  Surgeon:  Watt Rush, MD;  Location: [facility];  Service: Urology;  Laterality: Right;  60  MINUTE CASE   ESOPHAGOGASTRODUODENOSCOPY (EGD) WITH PROPOFOL  N/A 09/07/2018   Procedure: ESOPHAGOGASTRODUODENOSCOPY (EGD) WITH PROPOFOL ;  Surgeon: Elicia Claw, MD;  [facility]: [facility];  Service: Gastroenterology;  Laterality: N/A;   EXPLORATORY LAPARTOMY /  CHOLECYSTECTOMY  02/28/2005   for Small  bowel obstruction (mechnical)   IR ANGIO EXTRACRAN SEL COM CAROTID INNOMINATE UNI L MOD SED  10/24/2016   IR ANGIO VERTEBRAL SEL SUBCLAVIAN INNOMINATE BILAT MOD SED  10/24/2016   IR PERCUTANEOUS ART THROMBECTOMY/INFUSION INTRACRANIAL INC DIAG ANGIO  10/24/2016   IR RADIOLOGIST EVAL & MGMT  12/05/2016   LEFT KNEE ARTHROSCOPY  2006   MAZE N/A 01/30/2018   Procedure: MAZE;  Surgeon: Dusty Sudie DEL, MD;  Location: [facility] OR;  Service: Open Heart Surgery;  Laterality: N/A;   MITRAL VALVE REPAIR N/A 01/30/2018   Procedure: MITRAL VALVE REPAIR (MVR) USING CARBOMEDICS ANNULOFLEX SIZE 28;  Surgeon: Dusty Sudie DEL, MD;  Location: [facility] OR;  Service: Open Heart Surgery;  Laterality: N/A;   POLYPECTOMY  10/21/2018   Procedure: POLYPECTOMY;  Surgeon: Elicia Claw, MD;  [facility];  Service: Gastroenterology;;  RADIOLOGY WITH ANESTHESIA N/A 10/24/2016   Procedure: RADIOLOGY WITH ANESTHESIA;  Surgeon: Dolphus Carrion, MD;  [facility];  Service: Radiology;  Laterality: N/A;   RIGHT FOOT SURGERY     RIGHT MINIATURE ANTERIOR THORACOTOMY FOR AORTIC VALVE REPLACEMENT AND CLOSURE PATENT FORAMEN OVALE  08-03-2010  DR DUSTY Bohr Magna-ease pericardial tissue valve (25mm)   RIGHT/LEFT HEART CATH AND CORONARY ANGIOGRAPHY N/A 10/14/2017   Procedure: RIGHT/LEFT HEART CATH AND CORONARY ANGIOGRAPHY;  Surgeon: Wonda Sharper, MD;  Location: [facility] INVASIVE CV LAB;  Service: Cardiovascular;  Laterality: N/A;   TEE WITHOUT CARDIOVERSION N/A 10/14/2017   Procedure: TRANSESOPHAGEAL ECHOCARDIOGRAM (TEE);  Surgeon: Delford Maude BROCKS, MD;  Location: [facility] ENDOSCOPY;  Service: Cardiovascular;  Laterality: N/A;   TOTAL HIP ARTHROPLASTY Left 08/02/2020   Procedure: TOTAL HIP ARTHROPLASTY ANTERIOR APPROACH;  Surgeon: Ernie Cough, MD;  Location: [facility];  Service: Orthopedics;  Laterality: Left;   TOTAL KNEE ARTHROPLASTY Left 09/19/2021   Procedure: TOTAL KNEE ARTHROPLASTY;  Surgeon: Ernie Cough, MD;  Location: [facility];  Service: Orthopedics;  Laterality: Left;   TOTAL KNEE ARTHROPLASTY Right 08/09/2022   Procedure: TOTAL KNEE ARTHROPLASTY;  Surgeon: Ernie Cough, MD;  Location: [facility];  Service: Orthopedics;  Laterality: Right;  90   TRANSTHORACIC ECHOCARDIOGRAM  05/30/2016  dr skains   moderate  LVH ef 60-65%/  bioprothesis aortic valve present ,normal grandient and no AI /  mild MV calcification , moderate MR /  mild PR/ moderate TR/  PASP 36mmHg/ (RA denisty was identified 04-27-2016 echo) and is seen again today, this is likely a promient eustacian ridge, atrium is normal size   TRICUSPID VALVE REPLACEMENT N/A 01/30/2018   Procedure: TRICUSPID VALVE REPAIR USING MC3 SIZE 28;  Surgeon: DUSTY Sudie DEL, MD;  Location: [facility] OR;  Service: Open Heart Surgery;  Laterality: N/A;    Social History:  reports that he has never smoked. He has never used smokeless tobacco. He reports current alcohol  use. He reports that he does not use drugs.  Allergies:  Allergies  Allergen Reactions   Sulfa Antibiotics Other (See Comments)    Granulocytosis   Cefepime  Other (See Comments)    encephalopathy   Sulfamethoxazole-Trimethoprim     Listed on MAR   Zestril  [Lisinopril ] Cough    Medications Prior to Admission  Medication Sig Dispense Refill   amLODipine  (NORVASC ) 5 MG tablet Take 1.5 tablets (7.5 mg total) by mouth daily. (Patient taking differently: Take 7.5 mg by mouth at bedtime.) 135 tablet 3   cholecalciferol  (VITAMIN D3) 25 MCG (1000 UNIT) tablet Take 1,000 Units by mouth in the morning.     Cinnamon 500 MG capsule Take  500 mg by mouth in the morning.     finasteride  (PROSCAR ) 5 MG tablet Take 1 tablet (5 mg total) by mouth daily. (Patient taking differently: Take 5 mg by mouth in the morning.) 30 tablet 0   losartan  (COZAAR ) 100 MG tablet Take 1 tablet (100 mg total) by mouth daily. (Patient taking differently: Take 100 mg by mouth in the morning.) 90 tablet 3   magnesium  hydroxide (MILK OF MAGNESIA) 400 MG/5ML suspension Take 30 mLs by mouth daily as needed for mild constipation.     Multiple Vitamins-Minerals (CENTRUM SILVER ADULT 50+) TABS Take 1 tablet by mouth in the morning.     rosuvastatin  (CRESTOR ) 10 MG tablet TAKE 1 TABLET BY MOUTH DAILY (Patient taking differently: Take 10 mg by mouth in the morning.) 100 tablet 3   tamsulosin  (FLOMAX )  0.4 MG CAPS capsule Take 0.4 mg by mouth at bedtime.     traZODone  (DESYREL ) 50 MG tablet Take 25 mg by mouth at bedtime as needed for sleep.     warfarin (COUMADIN ) 5 MG tablet Take 5 mg by mouth in the morning. Take 1 Tablet by mouth every morning except on Mondays take 1.5 tablets by mouth.       Physical Exam: Blood pressure 131/60, pulse 74, temperature 99.1 F (37.3 C), temperature source Oral, resp. rate 18, height 6' (1.829 m), weight 77.1 kg, SpO2 95%.  General: elderly white male laying on hospital bed, appears stated age, NAD. HEENT: head -normocephalic, atraumatic; Eyes: PERRLA, no conjunctival injection Neck- Trachea is midline CV- RRR, normal S1/S2, no M/R/G, no lower extremity edema  Pulm- breathing is non-labored ORA Abd- soft, NT/ND, previous long laparotomy scar noted, no hernias or masses, no guarding or peritonitis GU-normal external male anatomy MSK- UE/LE symmetrical, no cyanosis, clubbing, or edema. Neuro- CN II-XII grossly in tact, no paresthesias. Psych- Alert and Oriented x3 with appropriate affect Skin: warm and dry, no rashes or lesions   Results for orders placed or performed during the hospital encounter of 09/29/23 (from the  past 48 hours)  CBC with Differential     Status: Abnormal   Collection Time: 09/29/23  3:51 PM  Result Value Ref Range   WBC 5.9 4.0 - 10.5 K/uL   RBC 3.76 (L) 4.22 - 5.81 MIL/uL   Hemoglobin 11.3 (L) 13.0 - 17.0 g/dL   HCT 63.3 (L) 60.9 - 47.9 %   MCV 97.3 80.0 - 100.0 fL   MCH 30.1 26.0 - 34.0 pg   MCHC 30.9 30.0 - 36.0 g/dL   RDW 85.9 88.4 - 84.4 %   Platelets 100 (L) 150 - 400 K/uL   nRBC 0.0 0.0 - 0.2 %   Neutrophils Relative % 87 %   Neutro Abs 5.1 1.7 - 7.7 K/uL   Lymphocytes Relative 4 %   Lymphs Abs 0.3 (L) 0.7 - 4.0 K/uL   Monocytes Relative 8 %   Monocytes Absolute 0.5 0.1 - 1.0 K/uL   Eosinophils Relative 1 %   Eosinophils Absolute 0.0 0.0 - 0.5 K/uL   Basophils Relative 0 %   Basophils Absolute 0.0 0.0 - 0.1 K/uL   Immature Granulocytes 0 %   Abs Immature Granulocytes 0.01 0.00 - 0.07 K/uL    Comment: Performed at [facility], [address], [place], [place] [address]  Comprehensive metabolic panel     Status: Abnormal   Collection Time: 09/29/23  3:51 PM  Result Value Ref Range   Sodium 144 135 - 145 mmol/L   Potassium 3.8 3.5 - 5.1 mmol/L   Chloride 109 98 - 111 mmol/L   CO2 20 (L) 22 - 32 mmol/L   Glucose, Bld 139 (H) 70 - 99 mg/dL    Comment: Glucose reference range applies only to samples taken after fasting for at least 8 hours.   BUN 21 8 - 23 mg/dL   Creatinine, Ser 8.97 0.61 - 1.24 mg/dL   Calcium  9.3 8.9 - 10.3 mg/dL   Total Protein 7.4 6.5 - 8.1 g/dL   Albumin  4.3 3.5 - 5.0 g/dL   AST 24 15 - 41 U/L   ALT 20 0 - 44 U/L   Alkaline Phosphatase 72 38 - 126 U/L   Total Bilirubin 0.9 0.0 - 1.2 mg/dL   GFR, Estimated >39 >39 mL/min    Comment: (NOTE)  Calculated using the CKD-EPI Creatinine Equation (2021)    Anion gap 15 5 - 15    Comment: Performed at [facility], [address], [place], [place] [address]  Lipase, blood     Status: None   Collection Time: 09/29/23  3:51 PM  Result Value Ref Range    Lipase 17 11 - 51 U/L    Comment: Performed at [facility], [address], [place], [place] [address]  Protime-INR     Status: Abnormal   Collection Time: 09/29/23  4:13 PM  Result Value Ref Range   Prothrombin Time 30.3 (H) 11.4 - 15.2 seconds   INR 2.7 (H) 0.8 - 1.2    Comment: (NOTE) INR goal varies based on device and disease states. Performed at [facility], [address] [place], [place] [address]   I-Stat CG4 Lactic Acid     Status: None   Collection Time: 09/29/23  4:22 PM  Result Value Ref Range   Lactic Acid, Venous 1.3 0.5 - 1.9 mmol/L  Resp panel by RT-PCR (RSV, Flu A&B, Covid) Anterior Nasal Swab     Status: None   Collection Time: 09/29/23  4:24 PM   Specimen: Anterior Nasal Swab  Result Value Ref Range   SARS Coronavirus 2 by RT PCR NEGATIVE NEGATIVE    Comment: (NOTE) SARS-CoV-2 target nucleic acids are NOT DETECTED.  The SARS-CoV-2 RNA is generally detectable in upper respiratory specimens during the acute phase of infection. The lowest concentration of SARS-CoV-2 viral copies this assay can detect is 138 copies/mL. A negative result does not preclude SARS-Cov-2 infection and should not be used as the sole basis for treatment or other patient management decisions. A negative result may occur with  improper specimen collection/handling, submission of specimen other than nasopharyngeal swab, presence of viral mutation(s) within the areas targeted by this assay, and inadequate number of viral copies(<138 copies/mL). A negative result must be combined with clinical observations, patient history, and epidemiological information. The expected result is Negative.  Fact Sheet for Patients:  BloggerCourse.com  Fact Sheet for Healthcare Providers:  SeriousBroker.it  This test is no t yet approved or cleared by the [place]  FDA and  has been authorized for detection  and/or diagnosis of SARS-CoV-2 by FDA under an Emergency Use Authorization (EUA). This EUA will remain  in effect (meaning this test can be used) for the duration of the COVID-19 declaration under Section 564(b)(1) of the Act, 21 U.S.C.section 360bbb-3(b)(1), unless the authorization is terminated  or revoked sooner.       Influenza A by PCR NEGATIVE NEGATIVE   Influenza B by PCR NEGATIVE NEGATIVE    Comment: (NOTE) The Xpert Xpress SARS-CoV-2/FLU/RSV plus assay is intended as an aid in the diagnosis of influenza from Nasopharyngeal swab specimens and should not be used as a sole basis for treatment. Nasal washings and aspirates are unacceptable for Xpert Xpress SARS-CoV-2/FLU/RSV testing.  Fact Sheet for Patients: BloggerCourse.com  Fact Sheet for Healthcare Providers: SeriousBroker.it  This test is not yet approved or cleared by the [place]  FDA and has been authorized for detection and/or diagnosis of SARS-CoV-2 by FDA under an Emergency Use Authorization (EUA). This EUA will remain in effect (meaning this test can be used) for the duration of the COVID-19 declaration under Section 564(b)(1) of the Act, 21 U.S.C. section 360bbb-3(b)(1), unless the authorization is terminated or revoked.     Resp Syncytial Virus by PCR NEGATIVE NEGATIVE  Comment: (NOTE) Fact Sheet for Patients: BloggerCourse.com  Fact Sheet for Healthcare Providers: SeriousBroker.it  This test is not yet approved or cleared by the [place]  FDA and has been authorized for detection and/or diagnosis of SARS-CoV-2 by FDA under an Emergency Use Authorization (EUA). This EUA will remain in effect (meaning this test can be used) for the duration of the COVID-19 declaration under Section 564(b)(1) of the Act, 21 U.S.C. section 360bbb-3(b)(1), unless the authorization is terminated  or revoked.  Performed at [facility], [address] [place], [place] [address]   Urinalysis, Routine w reflex microscopic -Urine, Clean Catch     Status: Abnormal   Collection Time: 09/29/23  5:45 PM  Result Value Ref Range   Color, Urine YELLOW YELLOW   APPearance CLOUDY (A) CLEAR   Specific Gravity, Urine 1.028 1.005 - 1.030   pH 5.0 5.0 - 8.0   Glucose, UA NEGATIVE NEGATIVE mg/dL   Hgb urine dipstick NEGATIVE NEGATIVE   Bilirubin Urine NEGATIVE NEGATIVE   Ketones, ur NEGATIVE NEGATIVE mg/dL   Protein, ur NEGATIVE NEGATIVE mg/dL   Nitrite POSITIVE (A) NEGATIVE   Leukocytes,Ua LARGE (A) NEGATIVE   RBC / HPF 6-10 0 - 5 RBC/hpf   WBC, UA >50 0 - 5 WBC/hpf   Bacteria, UA RARE (A) NONE SEEN   Squamous Epithelial / HPF 0-5 0 - 5 /HPF   Mucus PRESENT     Comment: Performed at [facility], [address], [place], [place] [address]  Respiratory (~20 pathogens) panel by PCR     Status: None   Collection Time: 09/30/23 12:36 AM   Specimen: Nasopharyngeal Swab; Respiratory  Result Value Ref Range   Adenovirus NOT DETECTED NOT DETECTED   Coronavirus 229E NOT DETECTED NOT DETECTED    Comment: (NOTE) The Coronavirus on the Respiratory Panel, DOES NOT test for the novel  Coronavirus (2019 nCoV)    Coronavirus HKU1 NOT DETECTED NOT DETECTED   Coronavirus NL63 NOT DETECTED NOT DETECTED   Coronavirus OC43 NOT DETECTED NOT DETECTED   Metapneumovirus NOT DETECTED NOT DETECTED   Rhinovirus / Enterovirus NOT DETECTED NOT DETECTED   Influenza A NOT DETECTED NOT DETECTED   Influenza B NOT DETECTED NOT DETECTED   Parainfluenza Virus 1 NOT DETECTED NOT DETECTED   Parainfluenza Virus 2 NOT DETECTED NOT DETECTED   Parainfluenza Virus 3 NOT DETECTED NOT DETECTED   Parainfluenza Virus 4 NOT DETECTED NOT DETECTED   Respiratory Syncytial Virus NOT DETECTED NOT DETECTED   Bordetella pertussis NOT DETECTED NOT DETECTED   Bordetella Parapertussis NOT  DETECTED NOT DETECTED   Chlamydophila pneumoniae NOT DETECTED NOT DETECTED   Mycoplasma pneumoniae NOT DETECTED NOT DETECTED    Comment: Performed at [facility], [address], [place], [place] [address]  Comprehensive metabolic panel     Status: Abnormal   Collection Time: 09/30/23  4:39 AM  Result Value Ref Range   Sodium 147 (H) 135 - 145 mmol/L   Potassium 3.4 (L) 3.5 - 5.1 mmol/L   Chloride 110 98 - 111 mmol/L   CO2 22 22 - 32 mmol/L   Glucose, Bld 121 (H) 70 - 99 mg/dL    Comment: Glucose reference range applies only to samples taken after fasting for at least 8 hours.   BUN 18 8 - 23 mg/dL   Creatinine, Ser 9.05 0.61 - 1.24 mg/dL   Calcium  9.0 8.9 - 10.3 mg/dL   Total Protein 6.9 6.5 - 8.1 g/dL  Albumin  4.1 3.5 - 5.0 g/dL   AST 24 15 - 41 U/L   ALT 18 0 - 44 U/L   Alkaline Phosphatase 65 38 - 126 U/L   Total Bilirubin 1.1 0.0 - 1.2 mg/dL   GFR, Estimated >39 >39 mL/min    Comment: (NOTE) Calculated using the CKD-EPI Creatinine Equation (2021)    Anion gap 15 5 - 15    Comment: Performed at [facility], [address] [place], [place] [address]  CBC     Status: Abnormal   Collection Time: 09/30/23  4:39 AM  Result Value Ref Range   WBC 4.9 4.0 - 10.5 K/uL   RBC 3.43 (L) 4.22 - 5.81 MIL/uL   Hemoglobin 10.4 (L) 13.0 - 17.0 g/dL   HCT 66.7 (L) 60.9 - 47.9 %   MCV 96.8 80.0 - 100.0 fL   MCH 30.3 26.0 - 34.0 pg   MCHC 31.3 30.0 - 36.0 g/dL   RDW 85.8 88.4 - 84.4 %   Platelets 94 (L) 150 - 400 K/uL    Comment: SPECIMEN CHECKED FOR CLOTS REPEATED TO VERIFY Immature Platelet Fraction may be clinically indicated, consider ordering this additional test [id number]    nRBC 0.0 0.0 - 0.2 %    Comment: Performed at [facility], [address] [place], [place] [address]  Protime-INR     Status: Abnormal   Collection Time: 09/30/23  4:39 AM  Result Value Ref Range   Prothrombin Time 30.3 (H) 11.4 - 15.2 seconds   INR 2.7  (H) 0.8 - 1.2    Comment: (NOTE) INR goal varies based on device and disease states. Performed at [facility], [address], [place], [place] [address]    DG Abd Portable 1V-Small Bowel Protocol-Position Verification Result Date: 09/29/2023 CLINICAL DATA:  NG tube placement EXAM: PORTABLE ABDOMEN - 1 VIEW COMPARISON:  02/04/2023 FINDINGS: NG tube tip is in the fundus of the stomach with the side port near the GE junction. IMPRESSION: NG tube in the stomach. Electronically Signed   By: Franky Crease M.D.   On: 09/29/2023 18:15   DG Chest Portable 1 View Result Date: 09/29/2023 CLINICAL DATA:  Fever, nausea, and vomiting. EXAM: PORTABLE CHEST 1 VIEW COMPARISON:  03/09/2023. FINDINGS: The heart is enlarged and the mediastinal contour stable. A left atrial appendage clip is noted. There is atherosclerotic calcification of the aorta. No consolidation, effusion, or pneumothorax is seen. Sternotomy wires are present over the midline. No acute osseous abnormality. IMPRESSION: No active disease. Electronically Signed   By: Leita Birmingham M.D.   On: 09/29/2023 17:39   CT ABDOMEN PELVIS W CONTRAST Result Date: 09/29/2023 CLINICAL DATA:  Abdominal pain, acute, nonlocalized. Nausea, vomiting. EXAM: CT ABDOMEN AND PELVIS WITH CONTRAST TECHNIQUE: Multidetector CT imaging of the abdomen and pelvis was performed using the standard protocol following bolus administration of intravenous contrast. RADIATION DOSE REDUCTION: This exam was performed according to the departmental dose-optimization program which includes automated exposure control, adjustment of the mA and/or kV according to patient size and/or use of iterative reconstruction technique. CONTRAST:  OMNIPAQUE  IOHEXOL  300 MG/ML  SOLN COMPARISON:  None Available. FINDINGS: Lower chest: Cardiomegaly.  No acute findings. Hepatobiliary: No focal liver abnormality is seen. Status post cholecystectomy. No biliary dilatation. Pancreas: No focal  abnormality or ductal dilatation. Spleen: No focal abnormality.  Normal size. Adrenals/Urinary Tract: No suspicious renal or adrenal abnormality. No stones or hydronephrosis. Urinary bladder unremarkable. Stomach/Bowel: Normal appendix. Left  colonic diverticulosis. No active diverticulitis. Mildly prominent upper abdominal small bowel loops. No well-defined transition. Mid and distal small bowel loops are decompressed. This could reflect mild focal ileus or early low grade small bowel obstruction. Stomach decompressed. Vascular/Lymphatic: Aortic atherosclerosis. No evidence of aneurysm or adenopathy. Reproductive: Prostate enlargement. Other: No free fluid or free air. Musculoskeletal: No acute bony abnormality. Prior left hip replacement. IMPRESSION: Mildly prominent upper abdominal small bowel loops could reflect mild focal ileus or early low grade small bowel obstruction. Aortic atherosclerosis. Left colonic diverticulosis. Electronically Signed   By: Franky Crease M.D.   On: 09/29/2023 17:20      Assessment/Plan SBO 88 year old male with history of exploratory laparotomy and cholecystectomy who presents with less than 24 hours of nausea, vomiting, and fatigue.  I have personally reviewed his history, labs, and imaging.  He had a low-grade fever of 100.4 in the emergency department.  WBC 5.9.  CMP unremarkable.  Lipase within normal limits.  INR 2.7.  Lactic acid 1.3.  CT of the abdomen and pelvis showed mildly distended upper abdominal small bowel loops without identified transition point, concerning for possible ileus versus bowel obstruction.  An NG tube was placed.  The patient remains clinically obstructed this morning without flatus or bowel movements.  No emergent need for surgery.  Agree with attempted nonoperative management with small bowel obstruction protocol, RN to give Gastrografin  this morning. Please hold warfarin.    FEN - NPO, IVF per primary, NGT to LIWS VTE - SCD's, warfarin held, ok  for hep gtt if needed ID - none Admit - TR[facility] service for mgmt mmp problems   I reviewed nursing notes, ED provider notes, hospitalist notes, last 24 h vitals and pain scores, last 48 h intake and output, last 24 h labs and trends, and last 24 h imaging results.  [name], [facility] 09/30/2023, 9:39 AM Please see Amion for pager number during day hours 7:00am-4:30pm or 7:00am -11:30am on weekends

## 2023-10-01 ENCOUNTER — Inpatient Hospital Stay (HOSPITAL_COMMUNITY)

## 2023-10-01 ENCOUNTER — Ambulatory Visit: Attending: Cardiology

## 2023-10-01 DIAGNOSIS — K56609 Unspecified intestinal obstruction, unspecified as to partial versus complete obstruction: Secondary | ICD-10-CM | POA: Diagnosis not present

## 2023-10-01 LAB — CBC WITH DIFFERENTIAL/PLATELET
Abs Immature Granulocytes: 0.02 K/uL (ref 0.00–0.07)
Basophils Absolute: 0 K/uL (ref 0.0–0.1)
Basophils Relative: 0 %
Eosinophils Absolute: 0 K/uL (ref 0.0–0.5)
Eosinophils Relative: 1 %
HCT: 37 % — ABNORMAL LOW (ref 39.0–52.0)
Hemoglobin: 11.1 g/dL — ABNORMAL LOW (ref 13.0–17.0)
Immature Granulocytes: 0 %
Lymphocytes Relative: 17 %
Lymphs Abs: 1.2 K/uL (ref 0.7–4.0)
MCH: 30.1 pg (ref 26.0–34.0)
MCHC: 30 g/dL (ref 30.0–36.0)
MCV: 100.3 fL — ABNORMAL HIGH (ref 80.0–100.0)
Monocytes Absolute: 0.9 K/uL (ref 0.1–1.0)
Monocytes Relative: 13 %
Neutro Abs: 4.7 K/uL (ref 1.7–7.7)
Neutrophils Relative %: 69 %
Platelets: 107 K/uL — ABNORMAL LOW (ref 150–400)
RBC: 3.69 MIL/uL — ABNORMAL LOW (ref 4.22–5.81)
RDW: 13.9 % (ref 11.5–15.5)
WBC: 6.8 K/uL (ref 4.0–10.5)
nRBC: 0 % (ref 0.0–0.2)

## 2023-10-01 LAB — RENAL FUNCTION PANEL
Albumin: 3.8 g/dL (ref 3.5–5.0)
Anion gap: 14 (ref 5–15)
BUN: 16 mg/dL (ref 8–23)
CO2: 25 mmol/L (ref 22–32)
Calcium: 9 mg/dL (ref 8.9–10.3)
Chloride: 111 mmol/L (ref 98–111)
Creatinine, Ser: 0.88 mg/dL (ref 0.61–1.24)
GFR, Estimated: >60 mL/min (ref >60)
Glucose, Bld: 137 mg/dL — ABNORMAL HIGH (ref 70–99)
Phosphorus: 2.4 mg/dL — ABNORMAL LOW (ref 2.5–4.6)
Potassium: 3.3 mmol/L — ABNORMAL LOW (ref 3.5–5.1)
Sodium: 149 mmol/L — ABNORMAL HIGH (ref 135–145)

## 2023-10-01 LAB — MAGNESIUM: Magnesium: 2.6 mg/dL — ABNORMAL HIGH (ref 1.7–2.4)

## 2023-10-01 MED ORDER — WARFARIN SODIUM 2.5 MG PO TABS
5.0000 mg | ORAL_TABLET | Freq: Every day | ORAL | Status: DC
  Administered 2023-10-01: 5 mg via ORAL
  Filled 2023-10-01: qty 2

## 2023-10-01 MED ORDER — AMLODIPINE BESYLATE 5 MG PO TABS
7.5000 mg | ORAL_TABLET | Freq: Every day | ORAL | Status: DC
  Administered 2023-10-01: 7.5 mg via ORAL
  Filled 2023-10-01: qty 2

## 2023-10-01 MED ORDER — TAMSULOSIN HCL 0.4 MG PO CAPS: 0.4000 mg | ORAL_CAPSULE | Freq: Every day | ORAL | Status: DC

## 2023-10-01 MED ORDER — FINASTERIDE 5 MG PO TABS
5.0000 mg | ORAL_TABLET | Freq: Every day | ORAL | Status: DC
  Administered 2023-10-01: 5 mg via ORAL
  Filled 2023-10-01: qty 1

## 2023-10-01 MED ORDER — WARFARIN - PHYSICIAN DOSING INPATIENT
Freq: Every day | Status: DC
Start: 2023-10-01 — End: 2023-10-01

## 2023-10-01 MED ORDER — LOSARTAN POTASSIUM 50 MG PO TABS
100.0000 mg | ORAL_TABLET | Freq: Every day | ORAL | Status: DC
  Administered 2023-10-01: 100 mg via ORAL
  Filled 2023-10-01: qty 2

## 2023-10-01 NOTE — Plan of Care (Signed)
  Problem: Education: Goal: Knowledge of General Education information will improve Description: Including pain rating scale, medication(s)/side effects and non-pharmacologic comfort measures Outcome: Progressing   Problem: Clinical Measurements: Goal: Diagnostic test results will improve Outcome: Progressing Goal: Respiratory complications will improve Outcome: Progressing Goal: Cardiovascular complication will be avoided Outcome: Progressing   Problem: Activity: Goal: Risk for activity intolerance will decrease Outcome: Progressing   Problem: Nutrition: Goal: Adequate nutrition will be maintained Outcome: Progressing   Problem: Coping: Goal: Level of anxiety will decrease Outcome: Progressing   Problem: Elimination: Goal: Will not experience complications related to bowel motility Outcome: Progressing Goal: Will not experience complications related to urinary retention Outcome: Progressing   Problem: Pain Managment: Goal: General experience of comfort will improve and/or be controlled Outcome: Progressing

## 2023-10-01 NOTE — Hospital Course (Addendum)
 [name] is an 88 year old male with PMH hypertension, hyperlipidemia, CAD, atrial fibrillation status post MAZE, CVA, AS s/p AVR, MV repair, tricuspid repair, chronic diastolic CHF, neuropathy, BPH, gout, anemia, and small bowel obstruction.  Patient presented with several episodes of nausea and vomiting.  CT of the abdomen and pelvis revealed mildly prominent upper abdominal loops of small bowel, reflective of ileus versus low-grade SBO.  Surgical input is appreciated.  NG tube placed.  He improved with supportive care and diet was slowly advanced prior to discharge.     Assessment & Plan:   Small bowel obstruction -CT with evidence of possible ileus versus low-grade SBO - History of prior SBO - General Surgery consulted to assist with patient's management - s/p NGT and then slow diet advancement  - tolerated soft diet prior to discharge   Fever - CXR unremarkable  - Fever curve remained low and no leukocytosis on CBC  Dehydration/hypernatremia: - s/p IVF   Hypokalemia: - repleted    Hypertension Hyperlipidemia CAD Hx CVA Neuropathy BPH Gout - Home meds resumed at discharge    Chronic diastolic CHF Valvular disease - History of aortic valve replacement, mitral valve repair, tricuspid valve repair. - Last echo was in March of this year with EF 70-75%, indeterminate diastolic function, normal RV function.  Mild to moderate mitral stenosis.   Atrial fibrillation - Status post MAZE.  Currently on warfarin; resume at discharge

## 2023-10-01 NOTE — Progress Notes (Signed)
 Subjective: Doing well today.  Moving his bowels with no nausea.  NGT is out and he has already tolerated his FLD this am.    ROS: See above, otherwise other systems negative  Objective: Vital signs in last 24 hours: Temp:  [98.6 F (37 C)-99.3 F (37.4 C)] 98.9 F (37.2 C) (09/23 0515) Pulse Rate:  [68-78] 73 (09/23 0515) Resp:  [16-18] 16 (09/23 0515) BP: (149-164)/(85-95) 164/85 (09/23 0515) SpO2:  [93 %-96 %] 95 % (09/23 0515) Last BM Date : 10/01/23  Intake/Output from previous day: 09/22 0701 - 09/23 0700 In: 1177.7 [I.V.:1177.7] Out: 2000 [Urine:1000; Emesis/NG output:1000] Intake/Output this shift: Total I/O In: 360 [P.O.:360] Out: 250 [Urine:250]  PE: Abd: soft, NT, ND  Lab Results:  Recent Labs    09/30/23 0439 10/01/23 0451  WBC 4.9 6.8  HGB 10.4* 11.1*  HCT 33.2* 37.0*  PLT 94* 107*   BMET Recent Labs    09/30/23 0439 10/01/23 0451  NA 147* 149*  K 3.4* 3.3*  CL 110 111  CO2 22 25  GLUCOSE 121* 137*  BUN 18 16  CREATININE 0.94 0.88  CALCIUM  9.0 9.0   PT/INR Recent Labs    09/29/23 1613 09/30/23 0439  LABPROT 30.3* 30.3*  INR 2.7* 2.7*   CMP     Component Value Date/Time   NA 149 (H) 10/01/2023 0451   NA 142 06/04/2023 1038   K 3.3 (L) 10/01/2023 0451   CL 111 10/01/2023 0451   CO2 25 10/01/2023 0451   GLUCOSE 137 (H) 10/01/2023 0451   BUN 16 10/01/2023 0451   BUN 16 06/04/2023 1038   CREATININE 0.88 10/01/2023 0451   CALCIUM  9.0 10/01/2023 0451   PROT 6.9 09/30/2023 0439   PROT 7.2 12/20/2020 1011   ALBUMIN  3.8 10/01/2023 0451   ALBUMIN  4.4 12/20/2020 1011   AST 24 09/30/2023 0439   ALT 18 09/30/2023 0439   ALKPHOS 65 09/30/2023 0439   BILITOT 1.1 09/30/2023 0439   BILITOT 0.5 12/20/2020 1011   GFRNONAA >60 10/01/2023 0451   GFRAA 83 11/25/2019 1040   Lipase     Component Value Date/Time   LIPASE 17 09/29/2023 1551       Studies/Results: DG CHEST PORT 1 VIEW Result Date: 10/01/2023 CLINICAL DATA:   Fever. EXAM: PORTABLE CHEST 1 VIEW COMPARISON:  09/29/2023. FINDINGS: Bilateral lung fields are clear. Bilateral costophrenic angles are clear. Normal cardio-mediastinal silhouette. Sternotomy wires noted. There is prosthetic aortic valve, mitral valve and left atrial appendage closure device. No acute osseous abnormalities. The soft tissues are within normal limits. Enteric tube seen with its tip overlying the left upper quadrant, overlying the proximal stomach. The side hole lies at the level of left hemidiaphragm, in the GE junction region. Consider further advancement of the tube by 2-3 inches to confidently put the side hole into the stomach. IMPRESSION: 1. No active disease. 2. Enteric tube seen with its tip overlying the left upper quadrant, overlying the proximal stomach. The side hole lies at the level of left hemidiaphragm, in the GE junction region. Consider further advancement of the tube by 2-3 inches to confidently put the side hole into the stomach. Electronically Signed   By: Ree Molt M.D.   On: 10/01/2023 09:30   DG Abd Portable 1V-Small Bowel Obstruction Protocol-initial, 8 hr delay Result Date: 09/30/2023 CLINICAL DATA:  8 hour delay for small-bowel obstruction. EXAM: PORTABLE ABDOMEN - 1 VIEW COMPARISON:  09/29/2023 FINDINGS: Contrast material is demonstrated  throughout the colon extending from the cecum to the rectum. No significant residual small bowel contrast material. This suggests no evidence of complete small bowel obstruction. Visualized small and large bowel are not abnormally distended. Incidental note of colonic diverticulosis. An enteric tube is present with tip projecting over the left upper quadrant consistent with location in the upper stomach. Surgical clips in the right upper quadrant and right lower quadrant. Surgical clips over the right groin region. Postoperative left hip arthroplasty. Degenerative changes in the spine. Lung bases are clear. IMPRESSION: Contrast  material demonstrated throughout the colon suggesting no evidence of complete small bowel obstruction. Electronically Signed   By: Elsie Gravely M.D.   On: 09/30/2023 18:09   DG Abd Portable 1V-Small Bowel Protocol-Position Verification Result Date: 09/29/2023 CLINICAL DATA:  NG tube placement EXAM: PORTABLE ABDOMEN - 1 VIEW COMPARISON:  02/04/2023 FINDINGS: NG tube tip is in the fundus of the stomach with the side port near the GE junction. IMPRESSION: NG tube in the stomach. Electronically Signed   By: Franky Crease M.D.   On: 09/29/2023 18:15   DG Chest Portable 1 View Result Date: 09/29/2023 CLINICAL DATA:  Fever, nausea, and vomiting. EXAM: PORTABLE CHEST 1 VIEW COMPARISON:  03/09/2023. FINDINGS: The heart is enlarged and the mediastinal contour stable. A left atrial appendage clip is noted. There is atherosclerotic calcification of the aorta. No consolidation, effusion, or pneumothorax is seen. Sternotomy wires are present over the midline. No acute osseous abnormality. IMPRESSION: No active disease. Electronically Signed   By: Leita Birmingham M.D.   On: 09/29/2023 17:39   CT ABDOMEN PELVIS W CONTRAST Result Date: 09/29/2023 CLINICAL DATA:  Abdominal pain, acute, nonlocalized. Nausea, vomiting. EXAM: CT ABDOMEN AND PELVIS WITH CONTRAST TECHNIQUE: Multidetector CT imaging of the abdomen and pelvis was performed using the standard protocol following bolus administration of intravenous contrast. RADIATION DOSE REDUCTION: This exam was performed according to the departmental dose-optimization program which includes automated exposure control, adjustment of the mA and/or kV according to patient size and/or use of iterative reconstruction technique. CONTRAST:  100mL OMNIPAQUE  IOHEXOL  300 MG/ML  SOLN COMPARISON:  None Available. FINDINGS: Lower chest: Cardiomegaly.  No acute findings. Hepatobiliary: No focal liver abnormality is seen. Status post cholecystectomy. No biliary dilatation. Pancreas: No focal  abnormality or ductal dilatation. Spleen: No focal abnormality.  Normal size. Adrenals/Urinary Tract: No suspicious renal or adrenal abnormality. No stones or hydronephrosis. Urinary bladder unremarkable. Stomach/Bowel: Normal appendix. Left colonic diverticulosis. No active diverticulitis. Mildly prominent upper abdominal small bowel loops. No well-defined transition. Mid and distal small bowel loops are decompressed. This could reflect mild focal ileus or early low grade small bowel obstruction. Stomach decompressed. Vascular/Lymphatic: Aortic atherosclerosis. No evidence of aneurysm or adenopathy. Reproductive: Prostate enlargement. Other: No free fluid or free air. Musculoskeletal: No acute bony abnormality. Prior left hip replacement. IMPRESSION: Mildly prominent upper abdominal small bowel loops could reflect mild focal ileus or early low grade small bowel obstruction. Aortic atherosclerosis. Left colonic diverticulosis. Electronically Signed   By: Franky Crease M.D.   On: 09/29/2023 17:20    Anti-infectives: Anti-infectives (From admission, onward)    None        Assessment/Plan SBO -8-hr delay film reviewed and contrast throughout his colon -he is having multiple BMs with no nausea -tolerating FLD, adv to soft. -surgically stable to DC home later today if tolerates soft diet  -d/w primary team  FEN - soft VTE - SCDs ID - none  I reviewed hospitalist notes, last  24 h vitals and pain scores, last 48 h intake and output, last 24 h labs and trends, and last 24 h imaging results.   LOS: 1 day    Burnard FORBES Banter , [facility] 10/01/2023, 10:19 AM Please see Amion for pager number during day hours 7:00am-4:30pm or 7:00am -11:30am on weekends

## 2023-10-01 NOTE — Progress Notes (Signed)
 Mobility Specialist - Progress Note    10/01/23 0920  Mobility  Activity Ambulated with assistance  Level of Assistance Modified independent, requires aide device or extra time  Assistive Device Front wheel walker  Distance Ambulated (ft) 750 ft  Range of Motion/Exercises Active  Activity Response Tolerated well  Mobility Referral Yes  Mobility visit 1 Mobility  Mobility Specialist Start Time (ACUTE ONLY) 0902  Mobility Specialist Stop Time (ACUTE ONLY) 0920  Mobility Specialist Time Calculation (min) (ACUTE ONLY) 18 min    Pt was received in recliner and agreed to mobility. Returned to recliner with all needs met. Call bell in reach.  [employer] - Mobility Specialist

## 2023-10-01 NOTE — Discharge Summary (Signed)
 Physician Discharge Summary   [name]. FMW:[id number] DOB: 1935-08-30 DOA: 09/29/2023  PCP: Katina Pfeiffer, PA-C  Admit date: 09/29/2023 Discharge date: 10/01/2023  Admitted From: Home Disposition:  Home Discharging physician: Alm Apo, MD Barriers to discharge: none   Discharge Condition: stable CODE STATUS: DNR - limited  Diet recommendation:  Diet Orders (From admission, onward)     Start     Ordered   10/01/23 1017  DIET SOFT Room service appropriate? Yes; Fluid consistency: Thin  Diet effective now       Question Answer Comment  Room service appropriate? Yes   Fluid consistency: Thin      10/01/23 1016            Hospital Course: [name] is an 88 year old male with PMH hypertension, hyperlipidemia, CAD, atrial fibrillation status post MAZE, CVA, AS s/p AVR, MV repair, tricuspid repair, chronic diastolic CHF, neuropathy, BPH, gout, anemia, and small bowel obstruction.  Patient presented with several episodes of nausea and vomiting.  CT of the abdomen and pelvis revealed mildly prominent upper abdominal loops of small bowel, reflective of ileus versus low-grade SBO.  Surgical input is appreciated.  NG tube placed.  He improved with supportive care and diet was slowly advanced prior to discharge.     Assessment & Plan:   Small bowel obstruction -CT with evidence of possible ileus versus low-grade SBO - History of prior SBO - General Surgery consulted to assist with patient's management - s/p NGT and then slow diet advancement  - tolerated soft diet prior to discharge   Fever - CXR unremarkable  - Fever curve remained low and no leukocytosis on CBC  Dehydration/hypernatremia: - s/p IVF   Hypokalemia: - repleted    Hypertension Hyperlipidemia CAD Hx CVA Neuropathy BPH Gout - Home meds resumed at discharge    Chronic diastolic CHF Valvular disease - History of aortic valve replacement, mitral valve repair, tricuspid valve  repair. - Last echo was in March of this year with EF 70-75%, indeterminate diastolic function, normal RV function.  Mild to moderate mitral stenosis.   Atrial fibrillation - Status post MAZE.  Currently on warfarin; resume at discharge   The patient's acute and chronic medical conditions were treated accordingly. On day of discharge, patient was felt deemed stable for discharge. Patient/family member advised to call PCP or come back to ER if needed.   Principal Diagnosis: SBO (small bowel obstruction) (HCC)  Discharge Diagnoses: Active Hospital Problems   Diagnosis Date Noted   SBO (small bowel obstruction) (HCC) 09/29/2023    Priority: 1.   History of CVA (cerebrovascular accident) 05/21/2021    Priority: 3.   Essential hypertension     Priority: 4.   CAD (coronary artery disease) 12/19/2020    Priority: 5.   Permanent atrial fibrillation (HCC)     Priority: 5.   s/p mitral valve repair 01/30/2018    Priority: 7.   Benign prostatic hyperplasia with lower urinary tract symptoms     Priority: 9.   (HFpEF) heart failure with preserved ejection fraction (HCC) 03/26/2023   HLD (hyperlipidemia) 09/23/2021   Anemia of chronic disease 09/23/2021   Valvular heart disease 05/28/2021   Peripheral neuropathy 05/26/2020   Chronic congestive heart failure (HCC) 05/26/2020   S/P tricuspid valve repair 01/30/2018   S/P Maze operation for atrial fibrillation 01/30/2018   S/P AVR (aortic valve replacement)     Resolved Hospital Problems  No resolved problems to display.     Discharge  Instructions     Increase activity slowly   Complete by: As directed       Allergies as of 10/01/2023       Reactions   Sulfa Antibiotics Other (See Comments)   Granulocytosis   Cefepime  Other (See Comments)   encephalopathy   Sulfamethoxazole-trimethoprim    Listed on MAR   Zestril  [lisinopril ] Cough        Medication List     TAKE these medications    amLODipine  5 MG tablet Commonly  known as: NORVASC  Take 1.5 tablets (7.5 mg total) by mouth daily. What changed: when to take this   Centrum Silver Adult 50+ Tabs Take 1 tablet by mouth in the morning.   cholecalciferol  25 MCG (1000 UNIT) tablet Commonly known as: VITAMIN D3 Take 1,000 Units by mouth in the morning.   Cinnamon 500 MG capsule Take 500 mg by mouth in the morning.   finasteride  5 MG tablet Commonly known as: PROSCAR  Take 1 tablet (5 mg total) by mouth daily. What changed: when to take this   losartan  100 MG tablet Commonly known as: COZAAR  Take 1 tablet (100 mg total) by mouth daily. What changed: when to take this   magnesium  hydroxide 400 MG/5ML suspension Commonly known as: MILK OF MAGNESIA Take 30 mLs by mouth daily as needed for mild constipation.   rosuvastatin  10 MG tablet Commonly known as: CRESTOR  TAKE 1 TABLET BY MOUTH DAILY What changed: when to take this   tamsulosin  0.4 MG Caps capsule Commonly known as: FLOMAX  Take 0.4 mg by mouth at bedtime.   traZODone  50 MG tablet Commonly known as: DESYREL  Take 25 mg by mouth at bedtime as needed for sleep.   warfarin 5 MG tablet Commonly known as: COUMADIN  Take as directed. If you are unsure how to take this medication, talk to your nurse or doctor. Original instructions: Take 5 mg by mouth in the morning. Take 1 Tablet by mouth every morning except on Mondays take 1.5 tablets by mouth.        Allergies  Allergen Reactions   Sulfa Antibiotics Other (See Comments)    Granulocytosis   Cefepime  Other (See Comments)    encephalopathy   Sulfamethoxazole-Trimethoprim     Listed on MAR   Zestril  [Lisinopril ] Cough    Consultations: General surgery   Procedures:   Discharge Exam: BP (!) 164/85 (BP Location: Left Arm)   Pulse 73   Temp 98.9 F (37.2 C) (Oral)   Resp 16   Ht 6' (1.829 m)   Wt 77.1 kg   SpO2 95%   BMI 23.06 kg/m  Physical Exam Constitutional:      General: He is not in acute distress.     Appearance: Normal appearance.  HENT:     Head: Normocephalic and atraumatic.     Mouth/Throat:     Mouth: Mucous membranes are moist.  Eyes:     Extraocular Movements: Extraocular movements intact.  Cardiovascular:     Rate and Rhythm: Normal rate and regular rhythm.  Pulmonary:     Effort: Pulmonary effort is normal. No respiratory distress.     Breath sounds: Normal breath sounds. No wheezing.  Abdominal:     General: Bowel sounds are normal. There is no distension.     Palpations: Abdomen is soft.     Tenderness: There is no abdominal tenderness.  Musculoskeletal:        General: Normal range of motion.     Cervical back: Normal range  of motion and neck supple.  Skin:    General: Skin is warm and dry.  Neurological:     General: No focal deficit present.     Mental Status: He is alert.  Psychiatric:        Mood and Affect: Mood normal.        Behavior: Behavior normal.      The results of significant diagnostics from this hospitalization (including imaging, microbiology, ancillary and laboratory) are listed below for reference.   Microbiology: Recent Results (from the past 240 hours)  Resp panel by RT-PCR (RSV, Flu A&B, Covid) Anterior Nasal Swab     Status: None   Collection Time: 09/29/23  4:24 PM   Specimen: Anterior Nasal Swab  Result Value Ref Range Status   SARS Coronavirus 2 by RT PCR NEGATIVE NEGATIVE Final    Comment: (NOTE) SARS-CoV-2 target nucleic acids are NOT DETECTED.  The SARS-CoV-2 RNA is generally detectable in upper respiratory specimens during the acute phase of infection. The lowest concentration of SARS-CoV-2 viral copies this assay can detect is 138 copies/mL. A negative result does not preclude SARS-Cov-2 infection and should not be used as the sole basis for treatment or other patient management decisions. A negative result may occur with  improper specimen collection/handling, submission of specimen other than nasopharyngeal swab, presence  of viral mutation(s) within the areas targeted by this assay, and inadequate number of viral copies(<138 copies/mL). A negative result must be combined with clinical observations, patient history, and epidemiological information. The expected result is Negative.  Fact Sheet for Patients:  BloggerCourse.com  Fact Sheet for Healthcare Providers:  SeriousBroker.it  This test is no t yet approved or cleared by the [place]  FDA and  has been authorized for detection and/or diagnosis of SARS-CoV-2 by FDA under an Emergency Use Authorization (EUA). This EUA will remain  in effect (meaning this test can be used) for the duration of the COVID-19 declaration under Section 564(b)(1) of the Act, 21 U.S.C.section 360bbb-3(b)(1), unless the authorization is terminated  or revoked sooner.       Influenza A by PCR NEGATIVE NEGATIVE Final   Influenza B by PCR NEGATIVE NEGATIVE Final    Comment: (NOTE) The Xpert Xpress SARS-CoV-2/FLU/RSV plus assay is intended as an aid in the diagnosis of influenza from Nasopharyngeal swab specimens and should not be used as a sole basis for treatment. Nasal washings and aspirates are unacceptable for Xpert Xpress SARS-CoV-2/FLU/RSV testing.  Fact Sheet for Patients: BloggerCourse.com  Fact Sheet for Healthcare Providers: SeriousBroker.it  This test is not yet approved or cleared by the [place]  FDA and has been authorized for detection and/or diagnosis of SARS-CoV-2 by FDA under an Emergency Use Authorization (EUA). This EUA will remain in effect (meaning this test can be used) for the duration of the COVID-19 declaration under Section 564(b)(1) of the Act, 21 U.S.C. section 360bbb-3(b)(1), unless the authorization is terminated or revoked.     Resp Syncytial Virus by PCR NEGATIVE NEGATIVE Final    Comment: (NOTE) Fact Sheet for  Patients: BloggerCourse.com  Fact Sheet for Healthcare Providers: SeriousBroker.it  This test is not yet approved or cleared by the [place]  FDA and has been authorized for detection and/or diagnosis of SARS-CoV-2 by FDA under an Emergency Use Authorization (EUA). This EUA will remain in effect (meaning this test can be used) for the duration of the COVID-19 declaration under Section 564(b)(1) of the Act, 21 U.S.C. section 360bbb-3(b)(1), unless the authorization is terminated or revoked.  Performed at [facility], [address] [place], [place] [address]   Respiratory (~20 pathogens) panel by PCR     Status: None   Collection Time: 09/30/23 12:36 AM   Specimen: Nasopharyngeal Swab; Respiratory  Result Value Ref Range Status   Adenovirus NOT DETECTED NOT DETECTED Final   Coronavirus 229E NOT DETECTED NOT DETECTED Final    Comment: (NOTE) The Coronavirus on the Respiratory Panel, DOES NOT test for the novel  Coronavirus (2019 nCoV)    Coronavirus HKU1 NOT DETECTED NOT DETECTED Final   Coronavirus NL63 NOT DETECTED NOT DETECTED Final   Coronavirus OC43 NOT DETECTED NOT DETECTED Final   Metapneumovirus NOT DETECTED NOT DETECTED Final   Rhinovirus / Enterovirus NOT DETECTED NOT DETECTED Final   Influenza A NOT DETECTED NOT DETECTED Final   Influenza B NOT DETECTED NOT DETECTED Final   Parainfluenza Virus 1 NOT DETECTED NOT DETECTED Final   Parainfluenza Virus 2 NOT DETECTED NOT DETECTED Final   Parainfluenza Virus 3 NOT DETECTED NOT DETECTED Final   Parainfluenza Virus 4 NOT DETECTED NOT DETECTED Final   Respiratory Syncytial Virus NOT DETECTED NOT DETECTED Final   Bordetella pertussis NOT DETECTED NOT DETECTED Final   Bordetella Parapertussis NOT DETECTED NOT DETECTED Final   Chlamydophila pneumoniae NOT DETECTED NOT DETECTED Final   Mycoplasma pneumoniae NOT DETECTED NOT DETECTED Final    Comment:  Performed at [facility], [address] [place], [place] [address]     Labs: BNP (last 3 results) Recent Labs    02/02/23 0806 02/04/23 1629 02/10/23 0453  BNP 432.5* 1,032.1* 349.4*   Basic Metabolic Panel: Recent Labs  Lab 09/29/23 1551 09/30/23 0439 10/01/23 0451  NA 144 147* 149*  K 3.8 3.4* 3.3*  CL 109 110 111  CO2 20* 22 25  GLUCOSE 139* 121* 137*  BUN 21 18 16   CREATININE 1.02 0.94 0.88  CALCIUM  9.3 9.0 9.0  MG  --   --  2.6*  PHOS  --   --  2.4*   Liver Function Tests: Recent Labs  Lab 09/29/23 1551 09/30/23 0439 10/01/23 0451  AST 24 24  --   ALT 20 18  --   ALKPHOS 72 65  --   BILITOT 0.9 1.1  --   PROT 7.4 6.9  --   ALBUMIN  4.3 4.1 3.8   Recent Labs  Lab 09/29/23 1551  LIPASE 17   No results for input(s): AMMONIA in the last 168 hours. CBC: Recent Labs  Lab 09/29/23 1551 09/30/23 0439 10/01/23 0451  WBC 5.9 4.9 6.8  NEUTROABS 5.1  --  4.7  HGB 11.3* 10.4* 11.1*  HCT 36.6* 33.2* 37.0*  MCV 97.3 96.8 100.3*  PLT 100* 94* 107*   Cardiac Enzymes: No results for input(s): CKTOTAL, CKMB, CKMBINDEX, TROPONINI in the last 168 hours. BNP: Invalid input(s): POCBNP CBG: No results for input(s): GLUCAP in the last 168 hours. D-Dimer No results for input(s): DDIMER in the last 72 hours. Hgb A1c No results for input(s): HGBA1C in the last 72 hours. Lipid Profile No results for input(s): CHOL, HDL, LDLCALC, TRIG, CHOLHDL, LDLDIRECT in the last 72 hours. Thyroid function studies No results for input(s): TSH, T4TOTAL, T3FREE, THYROIDAB in the last 72 hours.  Invalid input(s): FREET3 Anemia work up No results for input(s): VITAMINB12, FOLATE, FERRITIN, TIBC, IRON, RETICCTPCT in the last 72 hours. Urinalysis    Component Value Date/Time   COLORURINE YELLOW 09/29/2023 1745   APPEARANCEUR CLOUDY (A) 09/29/2023  1745   LABSPEC 1.028 09/29/2023 1745   PHURINE 5.0 09/29/2023 1745    GLUCOSEU NEGATIVE 09/29/2023 1745   HGBUR NEGATIVE 09/29/2023 1745   BILIRUBINUR NEGATIVE 09/29/2023 1745   KETONESUR NEGATIVE 09/29/2023 1745   PROTEINUR NEGATIVE 09/29/2023 1745   UROBILINOGEN 0.2 08/01/2010 0924   NITRITE POSITIVE (A) 09/29/2023 1745   LEUKOCYTESUR LARGE (A) 09/29/2023 1745   Sepsis Labs Recent Labs  Lab 09/29/23 1551 09/30/23 0439 10/01/23 0451  WBC 5.9 4.9 6.8   Microbiology Recent Results (from the past 240 hours)  Resp panel by RT-PCR (RSV, Flu A&B, Covid) Anterior Nasal Swab     Status: None   Collection Time: 09/29/23  4:24 PM   Specimen: Anterior Nasal Swab  Result Value Ref Range Status   SARS Coronavirus 2 by RT PCR NEGATIVE NEGATIVE Final    Comment: (NOTE) SARS-CoV-2 target nucleic acids are NOT DETECTED.  The SARS-CoV-2 RNA is generally detectable in upper respiratory specimens during the acute phase of infection. The lowest concentration of SARS-CoV-2 viral copies this assay can detect is 138 copies/mL. A negative result does not preclude SARS-Cov-2 infection and should not be used as the sole basis for treatment or other patient management decisions. A negative result may occur with  improper specimen collection/handling, submission of specimen other than nasopharyngeal swab, presence of viral mutation(s) within the areas targeted by this assay, and inadequate number of viral copies(<138 copies/mL). A negative result must be combined with clinical observations, patient history, and epidemiological information. The expected result is Negative.  Fact Sheet for Patients:  BloggerCourse.com  Fact Sheet for Healthcare Providers:  SeriousBroker.it  This test is no t yet approved or cleared by the [place]  FDA and  has been authorized for detection and/or diagnosis of SARS-CoV-2 by FDA under an Emergency Use Authorization (EUA). This EUA will remain  in effect (meaning this test can be  used) for the duration of the COVID-19 declaration under Section 564(b)(1) of the Act, 21 U.S.C.section 360bbb-3(b)(1), unless the authorization is terminated  or revoked sooner.       Influenza A by PCR NEGATIVE NEGATIVE Final   Influenza B by PCR NEGATIVE NEGATIVE Final    Comment: (NOTE) The Xpert Xpress SARS-CoV-2/FLU/RSV plus assay is intended as an aid in the diagnosis of influenza from Nasopharyngeal swab specimens and should not be used as a sole basis for treatment. Nasal washings and aspirates are unacceptable for Xpert Xpress SARS-CoV-2/FLU/RSV testing.  Fact Sheet for Patients: BloggerCourse.com  Fact Sheet for Healthcare Providers: SeriousBroker.it  This test is not yet approved or cleared by the [place]  FDA and has been authorized for detection and/or diagnosis of SARS-CoV-2 by FDA under an Emergency Use Authorization (EUA). This EUA will remain in effect (meaning this test can be used) for the duration of the COVID-19 declaration under Section 564(b)(1) of the Act, 21 U.S.C. section 360bbb-3(b)(1), unless the authorization is terminated or revoked.     Resp Syncytial Virus by PCR NEGATIVE NEGATIVE Final    Comment: (NOTE) Fact Sheet for Patients: BloggerCourse.com  Fact Sheet for Healthcare Providers: SeriousBroker.it  This test is not yet approved or cleared by the [place]  FDA and has been authorized for detection and/or diagnosis of SARS-CoV-2 by FDA under an Emergency Use Authorization (EUA). This EUA will remain in effect (meaning this test can be used) for the duration of the COVID-19 declaration under Section 564(b)(1) of the Act, 21 U.S.C. section 360bbb-3(b)(1), unless the authorization is terminated or revoked.  Performed  at [facility], [address], [place], [place] [address]   Respiratory (~20 pathogens) panel  by PCR     Status: None   Collection Time: 09/30/23 12:36 AM   Specimen: Nasopharyngeal Swab; Respiratory  Result Value Ref Range Status   Adenovirus NOT DETECTED NOT DETECTED Final   Coronavirus 229E NOT DETECTED NOT DETECTED Final    Comment: (NOTE) The Coronavirus on the Respiratory Panel, DOES NOT test for the novel  Coronavirus (2019 nCoV)    Coronavirus HKU1 NOT DETECTED NOT DETECTED Final   Coronavirus NL63 NOT DETECTED NOT DETECTED Final   Coronavirus OC43 NOT DETECTED NOT DETECTED Final   Metapneumovirus NOT DETECTED NOT DETECTED Final   Rhinovirus / Enterovirus NOT DETECTED NOT DETECTED Final   Influenza A NOT DETECTED NOT DETECTED Final   Influenza B NOT DETECTED NOT DETECTED Final   Parainfluenza Virus 1 NOT DETECTED NOT DETECTED Final   Parainfluenza Virus 2 NOT DETECTED NOT DETECTED Final   Parainfluenza Virus 3 NOT DETECTED NOT DETECTED Final   Parainfluenza Virus 4 NOT DETECTED NOT DETECTED Final   Respiratory Syncytial Virus NOT DETECTED NOT DETECTED Final   Bordetella pertussis NOT DETECTED NOT DETECTED Final   Bordetella Parapertussis NOT DETECTED NOT DETECTED Final   Chlamydophila pneumoniae NOT DETECTED NOT DETECTED Final   Mycoplasma pneumoniae NOT DETECTED NOT DETECTED Final    Comment: Performed at [facility], [address], [place], [place] [address]    Procedures/Studies: [place] CHEST PORT 1 VIEW Result Date: 10/01/2023 CLINICAL DATA:  Fever. EXAM: PORTABLE CHEST 1 VIEW COMPARISON:  09/29/2023. FINDINGS: Bilateral lung fields are clear. Bilateral costophrenic angles are clear. Normal cardio-mediastinal silhouette. Sternotomy wires noted. There is prosthetic aortic valve, mitral valve and left atrial appendage closure device. No acute osseous abnormalities. The soft tissues are within normal limits. Enteric tube seen with its tip overlying the left upper quadrant, overlying the proximal stomach. The side hole lies at the level of left hemidiaphragm, in the  GE junction region. Consider further advancement of the tube by 2-3 inches to confidently put the side hole into the stomach. IMPRESSION: 1. No active disease. 2. Enteric tube seen with its tip overlying the left upper quadrant, overlying the proximal stomach. The side hole lies at the level of left hemidiaphragm, in the GE junction region. Consider further advancement of the tube by 2-3 inches to confidently put the side hole into the stomach. Electronically Signed   By: Ree Molt M.D.   On: 10/01/2023 09:30   DG Abd Portable 1V-Small Bowel Obstruction Protocol-initial, 8 hr delay Result Date: 09/30/2023 CLINICAL DATA:  8 hour delay for small-bowel obstruction. EXAM: PORTABLE ABDOMEN - 1 VIEW COMPARISON:  09/29/2023 FINDINGS: Contrast material is demonstrated throughout the colon extending from the cecum to the rectum. No significant residual small bowel contrast material. This suggests no evidence of complete small bowel obstruction. Visualized small and large bowel are not abnormally distended. Incidental note of colonic diverticulosis. An enteric tube is present with tip projecting over the left upper quadrant consistent with location in the upper stomach. Surgical clips in the right upper quadrant and right lower quadrant. Surgical clips over the right groin region. Postoperative left hip arthroplasty. Degenerative changes in the spine. Lung bases are clear. IMPRESSION: Contrast material demonstrated throughout the colon suggesting no evidence of complete small bowel obstruction. Electronically Signed   By: Gerald Gravely M.D.   On: 09/30/2023 18:09   DG Abd Portable 1V-Small Bowel Protocol-Position Verification  Result Date: 09/29/2023 CLINICAL DATA:  NG tube placement EXAM: PORTABLE ABDOMEN - 1 VIEW COMPARISON:  02/04/2023 FINDINGS: NG tube tip is in the fundus of the stomach with the side port near the GE junction. IMPRESSION: NG tube in the stomach. Electronically Signed   By: Franky Crease M.D.    On: 09/29/2023 18:15   DG Chest Portable 1 View Result Date: 09/29/2023 CLINICAL DATA:  Fever, nausea, and vomiting. EXAM: PORTABLE CHEST 1 VIEW COMPARISON:  03/09/2023. FINDINGS: The heart is enlarged and the mediastinal contour stable. A left atrial appendage clip is noted. There is atherosclerotic calcification of the aorta. No consolidation, effusion, or pneumothorax is seen. Sternotomy wires are present over the midline. No acute osseous abnormality. IMPRESSION: No active disease. Electronically Signed   By: Leita Birmingham M.D.   On: 09/29/2023 17:39   CT ABDOMEN PELVIS W CONTRAST Result Date: 09/29/2023 CLINICAL DATA:  Abdominal pain, acute, nonlocalized. Nausea, vomiting. EXAM: CT ABDOMEN AND PELVIS WITH CONTRAST TECHNIQUE: Multidetector CT imaging of the abdomen and pelvis was performed using the standard protocol following bolus administration of intravenous contrast. RADIATION DOSE REDUCTION: This exam was performed according to the departmental dose-optimization program which includes automated exposure control, adjustment of the mA and/or kV according to patient size and/or use of iterative reconstruction technique. CONTRAST:  100mL OMNIPAQUE  IOHEXOL  300 MG/ML  SOLN COMPARISON:  None Available. FINDINGS: Lower chest: Cardiomegaly.  No acute findings. Hepatobiliary: No focal liver abnormality is seen. Status post cholecystectomy. No biliary dilatation. Pancreas: No focal abnormality or ductal dilatation. Spleen: No focal abnormality.  Normal size. Adrenals/Urinary Tract: No suspicious renal or adrenal abnormality. No stones or hydronephrosis. Urinary bladder unremarkable. Stomach/Bowel: Normal appendix. Left colonic diverticulosis. No active diverticulitis. Mildly prominent upper abdominal small bowel loops. No well-defined transition. Mid and distal small bowel loops are decompressed. This could reflect mild focal ileus or early low grade small bowel obstruction. Stomach decompressed.  Vascular/Lymphatic: Aortic atherosclerosis. No evidence of aneurysm or adenopathy. Reproductive: Prostate enlargement. Other: No free fluid or free air. Musculoskeletal: No acute bony abnormality. Prior left hip replacement. IMPRESSION: Mildly prominent upper abdominal small bowel loops could reflect mild focal ileus or early low grade small bowel obstruction. Aortic atherosclerosis. Left colonic diverticulosis. Electronically Signed   By: Franky Crease M.D.   On: 09/29/2023 17:20     Time coordinating discharge: Over 30 minutes    Alm Apo, MD  [facility] 10/01/2023, 1:32 PM

## 2023-10-07 DIAGNOSIS — L988 Other specified disorders of the skin and subcutaneous tissue: Secondary | ICD-10-CM | POA: Diagnosis not present

## 2023-10-07 DIAGNOSIS — L299 Pruritus, unspecified: Secondary | ICD-10-CM | POA: Diagnosis not present

## 2023-10-07 DIAGNOSIS — S41112A Laceration without foreign body of left upper arm, initial encounter: Secondary | ICD-10-CM | POA: Diagnosis not present

## 2023-10-08 DIAGNOSIS — I4891 Unspecified atrial fibrillation: Secondary | ICD-10-CM | POA: Diagnosis not present

## 2023-10-08 DIAGNOSIS — I10 Essential (primary) hypertension: Secondary | ICD-10-CM | POA: Diagnosis not present

## 2023-10-10 DIAGNOSIS — I5032 Chronic diastolic (congestive) heart failure: Secondary | ICD-10-CM | POA: Diagnosis not present

## 2023-10-10 DIAGNOSIS — I4891 Unspecified atrial fibrillation: Secondary | ICD-10-CM | POA: Diagnosis not present

## 2023-10-10 DIAGNOSIS — I10 Essential (primary) hypertension: Secondary | ICD-10-CM | POA: Diagnosis not present

## 2023-10-10 DIAGNOSIS — K56609 Unspecified intestinal obstruction, unspecified as to partial versus complete obstruction: Secondary | ICD-10-CM | POA: Diagnosis not present

## 2023-10-10 DIAGNOSIS — Z6823 Body mass index (BMI) 23.0-23.9, adult: Secondary | ICD-10-CM | POA: Diagnosis not present

## 2023-10-15 DIAGNOSIS — L089 Local infection of the skin and subcutaneous tissue, unspecified: Secondary | ICD-10-CM | POA: Diagnosis not present

## 2023-10-15 DIAGNOSIS — S81811A Laceration without foreign body, right lower leg, initial encounter: Secondary | ICD-10-CM | POA: Diagnosis not present

## 2023-10-15 DIAGNOSIS — S81801A Unspecified open wound, right lower leg, initial encounter: Secondary | ICD-10-CM | POA: Diagnosis not present

## 2023-10-19 DIAGNOSIS — S81801D Unspecified open wound, right lower leg, subsequent encounter: Secondary | ICD-10-CM | POA: Diagnosis not present

## 2023-10-24 DIAGNOSIS — I10 Essential (primary) hypertension: Secondary | ICD-10-CM | POA: Diagnosis not present

## 2023-11-04 ENCOUNTER — Other Ambulatory Visit: Payer: Self-pay | Admitting: Physician Assistant

## 2023-11-05 DIAGNOSIS — R413 Other amnesia: Secondary | ICD-10-CM | POA: Diagnosis not present

## 2023-11-05 DIAGNOSIS — I10 Essential (primary) hypertension: Secondary | ICD-10-CM | POA: Diagnosis not present

## 2023-11-08 ENCOUNTER — Encounter: Payer: Self-pay | Admitting: *Deleted

## 2023-11-11 NOTE — Progress Notes (Unsigned)
 OFFICE NOTE:    Date:  11/12/2023  ID:  Gerald JINNY Candy Mickey., DOB [birth year], MRN [id number] PCP: Katina Pfeiffer, PA-C  [facility] Providers Cardiologist:  Oneil Parchment, MD Cardiology APP:  Lelon Glendia DASEN, PA-C  Electrophysiologist:  OLE DASEN HOLTS, MD        Aortic insufficiency; s/p bioprosthetic AVR and PFO closure in 2012 Severe MR, severe TR >> S/p MV Repair, TV Repair, MAZE, LA clipping in 01/2018 TTE 03/22/21: EF 60-65, RVSP 50.8, Asc aorta (36 mm), Ao root (41 mm)  TTE 03/19/22: EF 65-70, no RWMA, mod LVH, NL RVSF, severely elevated PASP, RVSP 60.4, severe BAE, s/p MV repair w trivial MR, mean 9 (NL structure and fxn); s/p TV repair; s/p AVR w trivial AI, no AS, mean 15.4, mod PI, mild dilation of aortic root (40 mm), RAP 15  TTE 02/03/2023: EF 60-65, s/p MV repair with mean gradient 8, no MR; s/p AVR with mean gradient 14, no AI; s/p TV repair with mild TR TTE 03/21/23: EF 70-75, no RWMA, mod LVH, NL RVSF, mod pulm HTN, RVSP 57.5, severe RAE, s/p MV repair w trivial MR and mild to mod MS (mean 13 mmHg), s/p TV repair, s/p AVR w trivial AI and mean gradient 25.4 mmHg (normal structure and function of AVR), mod PI, aortic root 40 mm, RAP 15  (HFpEF) heart failure with preserved ejection fraction  Admitted 01/2023 - c/b asp pneumonia, sepsis, ureteral stone s/p stent, hemorrhagic renal cyst and L iliopsoas intramuscular bleed, acute CVA  Pulmonary hypertension (RVSP 03/2022: 60.4) ?related to age, COPD-no further w/u felt to be needed Hypertension Atenolol  DCd in 3/22 due to ? HR but resumed due to uncontrolled BP Permanent atrial fibrillation/flutter  Chronic anticoagulation w Warfarin Monitor 4/22: 151 SV runs; Mobitz 1, PACs 6.4%, rare PVCs, poss AF/Flutter Slow VR Monitor 04/2023: permanent atrial fibrillation, periods of slow VR (3rd degree HB) mostly during sleep - longest 1'25; brief tachycardia (?AF w aberrancy)    Hx of strokes R MCA CVA tx with cath based  Rx Acute/subacute L frontal CVA 05/2021 S/p CVA during admx in 01/2023 (sepsis, acute HF, a/c anemia) Coronary artery disease Non-obstructive by cath in 10/14/17 (pLCx 30, ost LAD 30, pLAD 40) Carotid artery stenosis  US  02/10/2023: Bilateral ICA 1-39 Aortic atherosclerosis  Dilated thoracic aorta Echocardiogram 3/24:  root 40 mm Hx of GI bleed Borderline diabetes mellitus BPH Peripheral neuropathy L hip fracture due to mechanical fall 7/22 >> s/p L THR Low albumin  S/p L TKR 09/2021  Anemia   Dr. Cloretta (heme)     Urosepsis in 03/2023        Discussed the use of AI scribe software for clinical note transcription with the patient, who gave verbal consent to proceed. History of Present Illness Gerald Maraj. is a 88 y.o. male for follow up of CHF, valvular heart disease, AFib. He was last seen in 03/2023. He had been admitted in 01/2023 with acute CHF, pneumonia, sepsis, CVA. His beta-blocker was stopped due to bradycardia. He was admitted again in 03/2023 w urosepsis. Echo 03/21/2023 demonstrated normal EF, moderate pulmonary hypertension, MV repair with mild to moderate mitral stenosis and trivial MR, s/p TV repair, s/p AVR with trivial AI and mean gradient 25.4 (normal structure and function), moderate PI. Echo was reviewed with Dr. Parchment and continued conservative management has been recommended. Follow up event monitor showed bradycardia. He was referred to EP (Dr. Holts). Pacemaker was not recommended. Conservative  management was recommended with an eye towards PPM if he becomes symptomatic. He was admitted 09/2023 for SBO.   He is here alone.  Blood pressure has remained uncontrolled.  Primary care stopped amlodipine  and losartan  and placed him on amlodipine /olmesartan combination.  He recently developed heaviness in his legs with compression stockings.  This contributed to dyspnea exertion.  He discontinued these with resolution of symptoms. He experiences lightheadedness upon  standing, which he manages by standing still for 20-30 seconds before walking. This symptom has been present for weeks but seems to be improving since changing socks. No episodes of syncope or falls.  He has a history of orthostatic hypotension. No chest pain, pressure, or tightness. No hematuria or melena. No episodes of waking up suddenly out of breath or experiencing orthopnea.    ROS-See HPI    Studies Reviewed:       Labs 10/01/23: K 3.3, SCr 0.88, eGFR > 60, Hgb 11.1, PLT 107K,  Results LABS Lipid panel: Total cholesterol 53, LDL 23, HDL 11, triglycerides 93 (97/97/7974) ALT: 18 (02/10/2023)  Risk Assessment/Calculations: CHA2DS2-VASc Score = 7   This indicates a 11.2% annual risk of stroke. The patient's score is based upon: CHF History: 1 HTN History: 1 Diabetes History: 0 Stroke History: 2 Vascular Disease History: 1 Age Score: 2 Gender Score: 0     HYPERTENSION CONTROL Vitals:   11/12/23 0908 11/12/23 0945  BP: (!) 156/74 (!) 140/80    The patient's blood pressure is elevated above target today.  In order to address the patient's elevated BP: Blood pressure will be monitored at home to determine if medication changes need to be made.         Physical Exam:  VS:  BP (!) 140/80   Pulse 76   Ht 6' (1.829 m)   Wt 178 lb (80.7 kg)   SpO2 96%   BMI 24.14 kg/m        Wt Readings from Last 3 Encounters:  11/12/23 178 lb (80.7 kg)  09/29/23 170 lb (77.1 kg)  08/21/23 173 lb 11.2 oz (78.8 kg)    Constitutional:      Appearance: Healthy appearance. Not in distress.  Neck:     Vascular: No JVR.  Pulmonary:     Breath sounds: Normal breath sounds. No wheezing. No rales.  Cardiovascular:     Bradycardia present. Irregularly irregular rhythm.     Murmurs: There is a grade 2/6 systolic murmur at the URSB.  Edema:    Peripheral edema present.    Ankle: bilateral trace edema of the ankle. Abdominal:     Palpations: Abdomen is soft.       Assessment and  Plan:    Assessment & Plan Chronic heart failure with preserved ejection fraction (HCC) NYHA IIb.  Volume status appears stable on exam.  HFpEF is well-managed with no current diuretic therapy. Valvular heart disease s/p mitral valve repair S/P AVR S/P tricuspid valve repair Recent echocardiogram shows trivial mitral regurgitation, mild to moderate mitral stenosis, and trivial aortic insufficiency. Aortic root is dilated at 40 mm. - Continue annual echocardiogram surveillance. - Continue SBE prophylaxis Permanent atrial fibrillation (HCC) Bradycardia Atrial fibrillation is rate controlled with no symptoms of bradycardia. Chronic anticoagulation with warfarin is ongoing. - Continue warfarin therapy. - Continue follow-up with Coumadin  clinic as planned. - We discussed symptoms that would indicate symptomatic bradycardia Dilated aortic root Aortic root is dilated at 40 mm by echocardiogram. - Continue annual surveillance of aortic root. Coronary artery  disease involving native coronary artery of native heart without angina pectoris Nonobstructive CAD by cardiac catheterization in 2019.  No symptoms suggestive of angina. Lipid levels are optimal with current statin therapy. - Continue rosuvastatin  10 mg daily. Essential hypertension Blood pressure remains elevated despite recent medication adjustment to amlodipine /olmesartan.  History of orthostatic hypotension limits the use of thiazide diuretics.  He just started on amlodipine /olmesartan combination recently. - Continue amlodipine /olmesartan 10/40 mg daily. - Monitor blood pressure and bring readings for review in 1-2 weeks. - Avoid thiazide diuretics due to orthostatic hypotension. - Will consider hydralazine  versus isosorbide if blood pressure remains above goal. - Would aim for goal of <140/90 for him       Dispo:  Return in about 6 months (around 05/11/2024) for Routine Follow Up, w/ Glendia Ferrier, PA-C.  Signed, Glendia Ferrier, PA-C

## 2023-11-12 ENCOUNTER — Ambulatory Visit: Attending: Physician Assistant | Admitting: Physician Assistant

## 2023-11-12 ENCOUNTER — Encounter: Payer: Self-pay | Admitting: Physician Assistant

## 2023-11-12 VITALS — BP 140/80 | HR 76 | Ht 72.0 in | Wt 178.0 lb

## 2023-11-12 DIAGNOSIS — Z952 Presence of prosthetic heart valve: Secondary | ICD-10-CM | POA: Diagnosis not present

## 2023-11-12 DIAGNOSIS — R001 Bradycardia, unspecified: Secondary | ICD-10-CM

## 2023-11-12 DIAGNOSIS — I77810 Thoracic aortic ectasia: Secondary | ICD-10-CM

## 2023-11-12 DIAGNOSIS — I2510 Atherosclerotic heart disease of native coronary artery without angina pectoris: Secondary | ICD-10-CM

## 2023-11-12 DIAGNOSIS — I4821 Permanent atrial fibrillation: Secondary | ICD-10-CM

## 2023-11-12 DIAGNOSIS — G629 Polyneuropathy, unspecified: Secondary | ICD-10-CM

## 2023-11-12 DIAGNOSIS — I38 Endocarditis, valve unspecified: Secondary | ICD-10-CM

## 2023-11-12 DIAGNOSIS — I10 Essential (primary) hypertension: Secondary | ICD-10-CM

## 2023-11-12 DIAGNOSIS — Z9889 Other specified postprocedural states: Secondary | ICD-10-CM

## 2023-11-12 DIAGNOSIS — I5032 Chronic diastolic (congestive) heart failure: Secondary | ICD-10-CM

## 2023-11-12 DIAGNOSIS — Z862 Personal history of diseases of the blood and blood-forming organs and certain disorders involving the immune mechanism: Secondary | ICD-10-CM

## 2023-11-12 NOTE — Assessment & Plan Note (Signed)
 Recent echocardiogram shows trivial mitral regurgitation, mild to moderate mitral stenosis, and trivial aortic insufficiency. Aortic root is dilated at 40 mm. - Continue annual echocardiogram surveillance. - Continue SBE prophylaxis

## 2023-11-12 NOTE — Assessment & Plan Note (Signed)
 Atrial fibrillation is rate controlled with no symptoms of bradycardia. Chronic anticoagulation with warfarin is ongoing. - Continue warfarin therapy. - Continue follow-up with Coumadin  clinic as planned. - We discussed symptoms that would indicate symptomatic bradycardia

## 2023-11-12 NOTE — Assessment & Plan Note (Signed)
 Blood pressure remains elevated despite recent medication adjustment to amlodipine /olmesartan.  History of orthostatic hypotension limits the use of thiazide diuretics.  He just started on amlodipine /olmesartan combination recently. - Continue amlodipine /olmesartan 10/40 mg daily. - Monitor blood pressure and bring readings for review in 1-2 weeks. - Avoid thiazide diuretics due to orthostatic hypotension. - Will consider hydralazine  versus isosorbide if blood pressure remains above goal. - Would aim for goal of <140/90 for him

## 2023-11-12 NOTE — Patient Instructions (Signed)
 Medication Instructions:  Your physician recommends that you continue on your current medications as directed. Please refer to the Current Medication list given to you today.  *If you need a refill on your cardiac medications before your next appointment, please call your pharmacy*  Lab Work: None ordered If you have labs (blood work) drawn today and your tests are completely normal, you will receive your results only by: MyChart Message (if you have MyChart) OR A paper copy in the mail If you have any lab test that is abnormal or we need to change your treatment, we will call you to review the results.  Testing/Procedures: None ordered  Follow-Up: At [facility], you and your health needs are our priority.  As part of our continuing mission to provide you with exceptional heart care, our providers are all part of one team.  This team includes your primary Cardiologist (physician) and Advanced Practice Providers or APPs (Physician Assistants and Nurse Practitioners) who all work together to provide you with the care you need, when you need it.  Your next appointment:   6 month(s)  Provider:   Glendia Ferrier, PA-C          We recommend signing up for the patient portal called MyChart.  Sign up information is provided on this After Visit Summary.  MyChart is used to connect with patients for Virtual Visits (Telemedicine).  Patients are able to view lab/test results, encounter notes, upcoming appointments, etc.  Non-urgent messages can be sent to your provider as well.   To learn more about what you can do with MyChart, go to [email or web address].   Other Instructions Monitor and Record BP measurements. Bring to your Coumadin  Check appointment and drop off with registration for [facility], PA-C.

## 2023-11-12 NOTE — Assessment & Plan Note (Signed)
 Nonobstructive CAD by cardiac catheterization in 2019.  No symptoms suggestive of angina. Lipid levels are optimal with current statin therapy. - Continue rosuvastatin  10 mg daily.

## 2023-11-19 ENCOUNTER — Telehealth: Payer: Self-pay | Admitting: Physician Assistant

## 2023-11-19 ENCOUNTER — Ambulatory Visit: Attending: Cardiology | Admitting: Pharmacist

## 2023-11-19 DIAGNOSIS — Z9889 Other specified postprocedural states: Secondary | ICD-10-CM | POA: Diagnosis not present

## 2023-11-19 DIAGNOSIS — Z7901 Long term (current) use of anticoagulants: Secondary | ICD-10-CM

## 2023-11-19 DIAGNOSIS — Z952 Presence of prosthetic heart valve: Secondary | ICD-10-CM

## 2023-11-19 LAB — POCT INR: INR: 1.7 — AB (ref 2.0–3.0)

## 2023-11-19 NOTE — Progress Notes (Signed)
 Description   INR 1.7 Take 2 tablets today and then continue taking warfarin 1 tablet daily except for 1.5 on Mondays.  Stay consistent with greens each week. Eat Greens Monday, Wednesday and Friday Recheck INR in 4 weeks.  Coumadin  Clinic [phone]

## 2023-11-19 NOTE — Telephone Encounter (Signed)
 Paper Work Dropped Off: BP readings  Date: 11-19-23  Location of paper: [address]'s mailbox   JB, 11-19-23

## 2023-11-19 NOTE — Patient Instructions (Signed)
 Description   INR 1.7 Take 2 tablets today and then continue taking warfarin 1 tablet daily except for 1.5 on Mondays.  Stay consistent with greens each week. Eat Greens Monday, Wednesday and Friday Recheck INR in 4 weeks.  Coumadin  Clinic [phone]

## 2023-11-20 ENCOUNTER — Telehealth: Payer: Self-pay | Admitting: Physician Assistant

## 2023-11-20 DIAGNOSIS — R0602 Shortness of breath: Secondary | ICD-10-CM

## 2023-11-20 DIAGNOSIS — I10 Essential (primary) hypertension: Secondary | ICD-10-CM

## 2023-11-20 NOTE — Telephone Encounter (Signed)
 BP readings received and reviewed: 157/82 152/78 147/76 1476/75 148/76 155/83  Please call pt. BPs provided are above goal. Given his age and hx of orthostatic hypotension, we may need to accept a higher goal for BP for him.  He notes his BPs were taken first thing in the morning. Please ask pt if these were taken before taking his medication in the morning. If so, monitor for 2 weeks by checking BP in the middle of the day (after taking his medication). Make sure he is seated for 15-20 mins before checking. Do not drink caffeine prior to checking. Keep feet on the floor. Check BP at heart level.  He notes shortness of breath on his note. Please ask pt if his weight is increased or his legs are swollen. Ask if he is having any chest pain, difficulty breathing when lying flat or dizziness/near syncope.  Have him come in for BMET, CBC, NT Pro BNP. I will place the orders.   Glendia Ferrier, PA-C    11/20/2023 4:44 PM

## 2023-11-20 NOTE — Telephone Encounter (Signed)
 BP tends to increase in the morning. Let's have him monitor blood pressure later in the day for a couple weeks. Have him check in the morning, after lunch and before bed, every day, for 1 week and send those to me. Will await blood work. Glendia Ferrier, PA-C    11/20/2023 5:50 PM

## 2023-11-20 NOTE — Telephone Encounter (Signed)
 Followed up with pt who reports he takes his BP medication in the evening/bedtime (around 7/8 pm).  He then takes his BP in the morning after he wakens.  Aware will forward to provider for advisement....   He reports the SOB/swelling occurs in the evenings.  He is going to the doctor first thing in the morning and will try and have needed blood work added to tomorrow's labs, if not he will stop by the office to have done.  Pt denies CP, difficulty breathing when lying flat nor dizziness/syncope. Forwarding to provider for recommendation/s.

## 2023-11-21 ENCOUNTER — Inpatient Hospital Stay: Attending: Oncology | Admitting: Oncology

## 2023-11-21 ENCOUNTER — Inpatient Hospital Stay

## 2023-11-21 VITALS — BP 126/77 | HR 60 | Temp 98.1°F | Resp 18 | Ht 72.0 in | Wt 179.3 lb

## 2023-11-21 DIAGNOSIS — I4891 Unspecified atrial fibrillation: Secondary | ICD-10-CM | POA: Insufficient documentation

## 2023-11-21 DIAGNOSIS — Z8673 Personal history of transient ischemic attack (TIA), and cerebral infarction without residual deficits: Secondary | ICD-10-CM | POA: Insufficient documentation

## 2023-11-21 DIAGNOSIS — D696 Thrombocytopenia, unspecified: Secondary | ICD-10-CM | POA: Insufficient documentation

## 2023-11-21 DIAGNOSIS — D649 Anemia, unspecified: Secondary | ICD-10-CM | POA: Diagnosis not present

## 2023-11-21 DIAGNOSIS — I2510 Atherosclerotic heart disease of native coronary artery without angina pectoris: Secondary | ICD-10-CM | POA: Insufficient documentation

## 2023-11-21 DIAGNOSIS — R5381 Other malaise: Secondary | ICD-10-CM | POA: Insufficient documentation

## 2023-11-21 DIAGNOSIS — Z7901 Long term (current) use of anticoagulants: Secondary | ICD-10-CM | POA: Insufficient documentation

## 2023-11-21 LAB — CBC WITH DIFFERENTIAL (CANCER CENTER ONLY)
Abs Immature Granulocytes: 0.01 K/uL (ref 0.00–0.07)
Basophils Absolute: 0 K/uL (ref 0.0–0.1)
Basophils Relative: 1 %
Eosinophils Absolute: 0.2 K/uL (ref 0.0–0.5)
Eosinophils Relative: 3 %
HCT: 30.7 % — ABNORMAL LOW (ref 39.0–52.0)
Hemoglobin: 10.2 g/dL — ABNORMAL LOW (ref 13.0–17.0)
Immature Granulocytes: 0 %
Lymphocytes Relative: 25 %
Lymphs Abs: 1.3 K/uL (ref 0.7–4.0)
MCH: 31.9 pg (ref 26.0–34.0)
MCHC: 33.2 g/dL (ref 30.0–36.0)
MCV: 95.9 fL (ref 80.0–100.0)
Monocytes Absolute: 0.8 K/uL (ref 0.1–1.0)
Monocytes Relative: 14 %
Neutro Abs: 3.1 K/uL (ref 1.7–7.7)
Neutrophils Relative %: 57 %
Platelet Count: 116 K/uL — ABNORMAL LOW (ref 150–400)
RBC: 3.2 MIL/uL — ABNORMAL LOW (ref 4.22–5.81)
RDW: 14.3 % (ref 11.5–15.5)
WBC Count: 5.4 K/uL (ref 4.0–10.5)
nRBC: 0 % (ref 0.0–0.2)

## 2023-11-21 NOTE — Telephone Encounter (Signed)
 Lelon Glendia DASEN, PA-C to Gretel Maeola CROME, RN    11/20/23  5:50 PM Note BP tends to increase in the morning. Let's have him monitor blood pressure later in the day for a couple weeks. Have him check in the morning, after lunch and before bed, every day, for 1 week and send those to me. Will await blood work. Glendia Lelon, PA-C    11/20/2023 5:50 PM        Attempted to contact pt.  Left detailed message on voicemail for the above information and requested pt call back if any questions or concerns.  Advised to either send blood pressure readings through MyChart of call them in in 1 week.

## 2023-11-21 NOTE — Progress Notes (Signed)
  [facility] OFFICE PROGRESS NOTE   Diagnosis: Anemia  INTERVAL HISTORY:   [name] returns as scheduled.  He was admitted in September with a small bowel obstruction.  He feels well at present.  Good appetite.  He has malaise.  He exercises several days per week.  Objective:  Vital signs in last 24 hours:  Blood pressure 126/77, pulse 60, temperature 98.1 F (36.7 C), temperature source Temporal, resp. rate 18, height 6' (1.829 m), weight 179 lb 4.8 oz (81.3 kg), SpO2 98%.   Lymphatics: No cervical, supraclavicular, axillary, or inguinal nodes Resp: End inspiratory rhonchi at the right posterior base, no respiratory distress Cardio: Regular rate and rhythm GI: No hepatosplenomegaly, nontender, no mass Vascular: Trace lower leg edema bilaterally   Lab Results  Component Value Date   WBC 5.4 11/21/2023   HGB 10.2 (L) 11/21/2023   HCT 30.7 (L) 11/21/2023   MCV 95.9 11/21/2023   PLT 116 (L) 11/21/2023   NEUTROABS 3.1 11/21/2023    CMP  Lab Results  Component Value Date   NA 149 (H) 10/01/2023   K 3.3 (L) 10/01/2023   CL 111 10/01/2023   CO2 25 10/01/2023   GLUCOSE 137 (H) 10/01/2023   BUN 16 10/01/2023   CREATININE 0.88 10/01/2023   CALCIUM  9.0 10/01/2023   PROT 6.9 09/30/2023   ALBUMIN  3.8 10/01/2023   AST 24 09/30/2023   ALT 18 09/30/2023   ALKPHOS 65 09/30/2023   BILITOT 1.1 09/30/2023   GFRNONAA >60 10/01/2023   GFRAA 83 11/25/2019    Medications: I have reviewed the patient's current medications.   Assessment/Plan:  Anemia Normal ferritin, normal vitamin B12, and negative stool Hemoccult cards January 2023 Left hip fracture July 2022 status post left hip replacement  Hospitalization August 2020 with symptomatic anemia, heme positive stool, ferritin 19, status post EGD;  colonoscopy 10/21/2018, gastritis with hemorrhage Atrial fibrillation on Coumadin  CAD AVR Mitral valve repair Left total knee replacement 09/19/2021 Admission  with urinary retention 09/23/2021 Chronic mild thrombocytopenia Admission 02/02/2023 with respiratory failure, urosepsis,-anemia/thrombocytopenia requiring multiple transfusions while hospitalized, status post cystoscopy with right ureteral stent placement February 2025: Acute left frontal/temporal CVA Admission 03/09/2023 with E. coli bacteremia, status post cystoscopy with right ureteroscopy and laser application/stone extraction        Disposition: [name] has chronic anemia.  The hemoglobin has not changed significantly over the past several months.  Anemia is likely secondary to chronic disease, subclinical bleeding while on Coumadin , or myelodysplasia.  He will call for symptoms of anemia.  The plan is to continue observation.  He will return for an office visit and CBC in 3 months.  Arley Hof, MD  11/21/2023  9:32 AM

## 2023-12-17 ENCOUNTER — Ambulatory Visit

## 2023-12-30 ENCOUNTER — Ambulatory Visit: Attending: Cardiology

## 2023-12-30 DIAGNOSIS — Z7901 Long term (current) use of anticoagulants: Secondary | ICD-10-CM | POA: Diagnosis not present

## 2023-12-30 DIAGNOSIS — Z9889 Other specified postprocedural states: Secondary | ICD-10-CM | POA: Diagnosis not present

## 2023-12-30 DIAGNOSIS — Z952 Presence of prosthetic heart valve: Secondary | ICD-10-CM | POA: Diagnosis not present

## 2023-12-30 DIAGNOSIS — I4821 Permanent atrial fibrillation: Secondary | ICD-10-CM

## 2023-12-30 LAB — POCT INR: INR: 2.3 (ref 2.0–3.0)

## 2023-12-30 NOTE — Progress Notes (Signed)
 Description   INR 2.3 Take 2 tablets today and then continue taking warfarin 1 tablet daily except for 1.5 on Mondays.  Stay consistent with greens each week. Eat Greens Monday, Wednesday and Friday Recheck INR in 5 weeks.  Coumadin  Clinic [phone]

## 2023-12-30 NOTE — Patient Instructions (Signed)
 Description   INR 2.3 Take 2 tablets today and then continue taking warfarin 1 tablet daily except for 1.5 on Mondays.  Stay consistent with greens each week. Eat Greens Monday, Wednesday and Friday Recheck INR in 5 weeks.  Coumadin  Clinic [phone]

## 2024-02-03 ENCOUNTER — Ambulatory Visit

## 2024-02-04 ENCOUNTER — Ambulatory Visit

## 2024-02-13 ENCOUNTER — Other Ambulatory Visit: Payer: Self-pay | Admitting: Family Medicine

## 2024-02-13 DIAGNOSIS — R2242 Localized swelling, mass and lump, left lower limb: Secondary | ICD-10-CM

## 2024-02-20 ENCOUNTER — Inpatient Hospital Stay

## 2024-02-20 ENCOUNTER — Inpatient Hospital Stay: Admitting: Oncology

## 2024-02-21 ENCOUNTER — Ambulatory Visit

## 2024-02-25 ENCOUNTER — Other Ambulatory Visit
